# Patient Record
Sex: Female | Born: 1950 | Race: White | Hispanic: No | Smoking: Never smoker
Health system: Southern US, Community
[De-identification: ages and names within clinical notes are randomized; demographics above are authoritative.]

## PROBLEM LIST (undated history)

## (undated) DIAGNOSIS — E039 Hypothyroidism, unspecified: Secondary | ICD-10-CM

## (undated) DIAGNOSIS — A159 Respiratory tuberculosis unspecified: Secondary | ICD-10-CM

## (undated) DIAGNOSIS — I10 Essential (primary) hypertension: Secondary | ICD-10-CM

## (undated) DIAGNOSIS — M1990 Unspecified osteoarthritis, unspecified site: Secondary | ICD-10-CM

## (undated) DIAGNOSIS — I83009 Varicose veins of unspecified lower extremity with ulcer of unspecified site: Secondary | ICD-10-CM

## (undated) DIAGNOSIS — M810 Age-related osteoporosis without current pathological fracture: Secondary | ICD-10-CM

## (undated) DIAGNOSIS — F32A Depression, unspecified: Secondary | ICD-10-CM

## (undated) DIAGNOSIS — I2510 Atherosclerotic heart disease of native coronary artery without angina pectoris: Secondary | ICD-10-CM

## (undated) DIAGNOSIS — S72002A Fracture of unspecified part of neck of left femur, initial encounter for closed fracture: Secondary | ICD-10-CM

## (undated) DIAGNOSIS — J189 Pneumonia, unspecified organism: Secondary | ICD-10-CM

## (undated) DIAGNOSIS — K219 Gastro-esophageal reflux disease without esophagitis: Secondary | ICD-10-CM

## (undated) DIAGNOSIS — L97909 Non-pressure chronic ulcer of unspecified part of unspecified lower leg with unspecified severity: Secondary | ICD-10-CM

## (undated) DIAGNOSIS — E785 Hyperlipidemia, unspecified: Secondary | ICD-10-CM

## (undated) DIAGNOSIS — I509 Heart failure, unspecified: Secondary | ICD-10-CM

## (undated) DIAGNOSIS — F329 Major depressive disorder, single episode, unspecified: Secondary | ICD-10-CM

## (undated) DIAGNOSIS — I219 Acute myocardial infarction, unspecified: Secondary | ICD-10-CM

## (undated) HISTORY — PX: TONSILLECTOMY: SUR1361

## (undated) HISTORY — DX: Fracture of unspecified part of neck of left femur, initial encounter for closed fracture: S72.002A

## (undated) HISTORY — PX: CORONARY ARTERY BYPASS GRAFT: SHX141

## (undated) HISTORY — DX: Age-related osteoporosis without current pathological fracture: M81.0

## (undated) HISTORY — PX: CARDIAC CATHETERIZATION: SHX172

---

## 2001-08-17 ENCOUNTER — Encounter: Payer: Self-pay | Admitting: Family Medicine

## 2001-08-17 ENCOUNTER — Ambulatory Visit (HOSPITAL_COMMUNITY): Admission: RE | Admit: 2001-08-17 | Discharge: 2001-08-17 | Payer: Self-pay | Admitting: Family Medicine

## 2004-06-14 ENCOUNTER — Emergency Department (HOSPITAL_COMMUNITY): Admission: EM | Admit: 2004-06-14 | Discharge: 2004-06-14 | Payer: Self-pay | Admitting: Emergency Medicine

## 2010-09-09 HISTORY — PX: CORONARY ARTERY BYPASS GRAFT: SHX141

## 2011-07-25 ENCOUNTER — Encounter: Payer: Self-pay | Admitting: Emergency Medicine

## 2011-07-25 ENCOUNTER — Inpatient Hospital Stay (HOSPITAL_COMMUNITY)
Admission: EM | Admit: 2011-07-25 | Discharge: 2011-08-12 | DRG: 121 | Disposition: A | Discharge status: Home / Self Care | Payer: BC Managed Care – PPO | Attending: Internal Medicine | Admitting: Internal Medicine

## 2011-07-25 ENCOUNTER — Emergency Department (HOSPITAL_COMMUNITY): Payer: BC Managed Care – PPO

## 2011-07-25 DIAGNOSIS — E1029 Type 1 diabetes mellitus with other diabetic kidney complication: Secondary | ICD-10-CM | POA: Diagnosis present

## 2011-07-25 DIAGNOSIS — N179 Acute kidney failure, unspecified: Secondary | ICD-10-CM | POA: Diagnosis not present

## 2011-07-25 DIAGNOSIS — R9431 Abnormal electrocardiogram [ECG] [EKG]: Secondary | ICD-10-CM

## 2011-07-25 DIAGNOSIS — I5023 Acute on chronic systolic (congestive) heart failure: Secondary | ICD-10-CM

## 2011-07-25 DIAGNOSIS — E860 Dehydration: Secondary | ICD-10-CM | POA: Diagnosis present

## 2011-07-25 DIAGNOSIS — E876 Hypokalemia: Secondary | ICD-10-CM | POA: Diagnosis present

## 2011-07-25 DIAGNOSIS — Z794 Long term (current) use of insulin: Secondary | ICD-10-CM

## 2011-07-25 DIAGNOSIS — I129 Hypertensive chronic kidney disease with stage 1 through stage 4 chronic kidney disease, or unspecified chronic kidney disease: Secondary | ICD-10-CM | POA: Diagnosis present

## 2011-07-25 DIAGNOSIS — F329 Major depressive disorder, single episode, unspecified: Secondary | ICD-10-CM | POA: Diagnosis present

## 2011-07-25 DIAGNOSIS — E1010 Type 1 diabetes mellitus with ketoacidosis without coma: Secondary | ICD-10-CM | POA: Diagnosis present

## 2011-07-25 DIAGNOSIS — D72829 Elevated white blood cell count, unspecified: Secondary | ICD-10-CM | POA: Diagnosis present

## 2011-07-25 DIAGNOSIS — I214 Non-ST elevation (NSTEMI) myocardial infarction: Principal | ICD-10-CM | POA: Diagnosis present

## 2011-07-25 DIAGNOSIS — F3289 Other specified depressive episodes: Secondary | ICD-10-CM | POA: Diagnosis present

## 2011-07-25 DIAGNOSIS — E785 Hyperlipidemia, unspecified: Secondary | ICD-10-CM

## 2011-07-25 DIAGNOSIS — E1110 Type 2 diabetes mellitus with ketoacidosis without coma: Secondary | ICD-10-CM

## 2011-07-25 DIAGNOSIS — E162 Hypoglycemia, unspecified: Secondary | ICD-10-CM | POA: Diagnosis not present

## 2011-07-25 DIAGNOSIS — F339 Major depressive disorder, recurrent, unspecified: Secondary | ICD-10-CM | POA: Diagnosis present

## 2011-07-25 DIAGNOSIS — I83009 Varicose veins of unspecified lower extremity with ulcer of unspecified site: Secondary | ICD-10-CM | POA: Diagnosis present

## 2011-07-25 DIAGNOSIS — L97809 Non-pressure chronic ulcer of other part of unspecified lower leg with unspecified severity: Secondary | ICD-10-CM | POA: Diagnosis present

## 2011-07-25 DIAGNOSIS — I509 Heart failure, unspecified: Secondary | ICD-10-CM | POA: Diagnosis present

## 2011-07-25 DIAGNOSIS — R570 Cardiogenic shock: Secondary | ICD-10-CM | POA: Diagnosis present

## 2011-07-25 DIAGNOSIS — I2510 Atherosclerotic heart disease of native coronary artery without angina pectoris: Secondary | ICD-10-CM

## 2011-07-25 DIAGNOSIS — K8020 Calculus of gallbladder without cholecystitis without obstruction: Secondary | ICD-10-CM | POA: Diagnosis present

## 2011-07-25 DIAGNOSIS — N390 Urinary tract infection, site not specified: Secondary | ICD-10-CM | POA: Diagnosis not present

## 2011-07-25 DIAGNOSIS — E1049 Type 1 diabetes mellitus with other diabetic neurological complication: Secondary | ICD-10-CM | POA: Diagnosis present

## 2011-07-25 DIAGNOSIS — R5381 Other malaise: Secondary | ICD-10-CM | POA: Diagnosis present

## 2011-07-25 DIAGNOSIS — I10 Essential (primary) hypertension: Secondary | ICD-10-CM | POA: Diagnosis present

## 2011-07-25 DIAGNOSIS — L97909 Non-pressure chronic ulcer of unspecified part of unspecified lower leg with unspecified severity: Secondary | ICD-10-CM | POA: Diagnosis present

## 2011-07-25 DIAGNOSIS — R110 Nausea: Secondary | ICD-10-CM | POA: Diagnosis present

## 2011-07-25 DIAGNOSIS — E46 Unspecified protein-calorie malnutrition: Secondary | ICD-10-CM | POA: Diagnosis present

## 2011-07-25 DIAGNOSIS — I872 Venous insufficiency (chronic) (peripheral): Secondary | ICD-10-CM | POA: Diagnosis present

## 2011-07-25 DIAGNOSIS — E1065 Type 1 diabetes mellitus with hyperglycemia: Secondary | ICD-10-CM | POA: Diagnosis present

## 2011-07-25 DIAGNOSIS — I2589 Other forms of chronic ischemic heart disease: Secondary | ICD-10-CM | POA: Diagnosis present

## 2011-07-25 DIAGNOSIS — N189 Chronic kidney disease, unspecified: Secondary | ICD-10-CM | POA: Diagnosis present

## 2011-07-25 HISTORY — DX: Varicose veins of unspecified lower extremity with ulcer of unspecified site: I83.009

## 2011-07-25 HISTORY — DX: Hyperlipidemia, unspecified: E78.5

## 2011-07-25 HISTORY — DX: Essential (primary) hypertension: I10

## 2011-07-25 HISTORY — DX: Respiratory tuberculosis unspecified: A15.9

## 2011-07-25 HISTORY — DX: Non-pressure chronic ulcer of unspecified part of unspecified lower leg with unspecified severity: L97.909

## 2011-07-25 LAB — CBC
HCT: 44.6 % (ref 36.0–46.0)
Hemoglobin: 14.9 g/dL (ref 12.0–15.0)
MCH: 29.2 pg (ref 26.0–34.0)
MCHC: 33.4 g/dL (ref 30.0–36.0)
MCV: 87.3 fL (ref 78.0–100.0)
Platelets: 247 10*3/uL (ref 150–400)
RBC: 5.11 MIL/uL (ref 3.87–5.11)
RDW: 18.5 % — ABNORMAL HIGH (ref 11.5–15.5)
WBC: 15.5 10*3/uL — ABNORMAL HIGH (ref 4.0–10.5)

## 2011-07-25 LAB — BASIC METABOLIC PANEL
BUN: 21 mg/dL (ref 6–23)
BUN: 22 mg/dL (ref 6–23)
CO2: 18 mEq/L — ABNORMAL LOW (ref 19–32)
CO2: 21 mEq/L (ref 19–32)
Calcium: 8.6 mg/dL (ref 8.4–10.5)
Chloride: 100 mEq/L (ref 96–112)
Chloride: 99 mEq/L (ref 96–112)
Creatinine, Ser: 1.34 mg/dL — ABNORMAL HIGH (ref 0.50–1.10)
GFR calc Af Amer: 48 mL/min — ABNORMAL LOW (ref >90)
GFR calc non Af Amer: 42 mL/min — ABNORMAL LOW (ref >90)
GFR calc non Af Amer: 42 mL/min — ABNORMAL LOW (ref >90)
Glucose, Bld: 98 mg/dL (ref 70–99)
Glucose, Bld: 100 mg/dL — ABNORMAL HIGH (ref 70–99)
Potassium: 3.8 mEq/L (ref 3.5–5.1)
Sodium: 134 mEq/L — ABNORMAL LOW (ref 135–145)
Sodium: 135 mEq/L (ref 135–145)

## 2011-07-25 LAB — URINE MICROSCOPIC-ADD ON

## 2011-07-25 LAB — URINALYSIS, ROUTINE W REFLEX MICROSCOPIC
Glucose, UA: >1000 mg/dL — AB
Ketones, ur: 15 mg/dL — AB
pH: 5 (ref 5.0–8.0)

## 2011-07-25 LAB — POCT I-STAT 3, VENOUS BLOOD GAS (G3P V)
Acid-base deficit: 14 mmol/L — ABNORMAL HIGH (ref 0.0–2.0)
Bicarbonate: 13.1 mEq/L — ABNORMAL LOW (ref 20.0–24.0)
O2 Saturation: 39 %
pO2, Ven: 27 mmHg — CL (ref 30.0–45.0)

## 2011-07-25 LAB — POCT I-STAT, CHEM 8
BUN: 24 mg/dL — ABNORMAL HIGH (ref 6–23)
Calcium, Ion: 0.98 mmol/L — ABNORMAL LOW (ref 1.12–1.32)
Chloride: 101 mEq/L (ref 96–112)
Glucose, Bld: 610 mg/dL (ref 70–99)

## 2011-07-25 LAB — DIFFERENTIAL
Basophils Absolute: 0 10*3/uL (ref 0.0–0.1)
Eosinophils Relative: 0 % (ref 0–5)
Lymphocytes Relative: 10 % — ABNORMAL LOW (ref 12–46)
Lymphs Abs: 1.5 10*3/uL (ref 0.7–4.0)
Monocytes Absolute: 1 10*3/uL (ref 0.1–1.0)

## 2011-07-25 LAB — GLUCOSE, CAPILLARY
Glucose-Capillary: 410 mg/dL — ABNORMAL HIGH (ref 70–99)
Glucose-Capillary: 357 mg/dL — ABNORMAL HIGH (ref 70–99)
Glucose-Capillary: 77 mg/dL (ref 70–99)
Glucose-Capillary: 83 mg/dL (ref 70–99)
Glucose-Capillary: 145 mg/dL — ABNORMAL HIGH (ref 70–99)

## 2011-07-25 MED ORDER — HYDROMORPHONE HCL PF 1 MG/ML IJ SOLN
0.5000 mg | Freq: Once | INTRAMUSCULAR | Status: DC
  Filled 2011-07-25: qty 1

## 2011-07-25 MED ORDER — DEXTROSE 50 % IV SOLN
25.0000 mL | INTRAVENOUS | Status: DC | PRN
  Administered 2011-07-29 (×3): 25 mL via INTRAVENOUS
  Filled 2011-07-25: qty 50

## 2011-07-25 MED ORDER — INSULIN ASPART 100 UNIT/ML ~~LOC~~ SOLN
0.0000 [IU] | Freq: Three times a day (TID) | SUBCUTANEOUS | Status: DC
  Administered 2011-07-26: 9 [IU] via SUBCUTANEOUS
  Administered 2011-07-26: 2 [IU] via SUBCUTANEOUS
  Administered 2011-07-27: 1 [IU] via SUBCUTANEOUS
  Administered 2011-07-27 (×2): 3 [IU] via SUBCUTANEOUS
  Administered 2011-07-28: 1 [IU] via SUBCUTANEOUS
  Administered 2011-07-29: 5 [IU] via SUBCUTANEOUS
  Administered 2011-07-30: 2 [IU] via SUBCUTANEOUS
  Administered 2011-07-30: 1 [IU] via SUBCUTANEOUS
  Administered 2011-07-30 – 2011-07-31 (×2): 2 [IU] via SUBCUTANEOUS
  Administered 2011-07-31: 1 [IU] via SUBCUTANEOUS
  Administered 2011-08-01: 5 [IU] via SUBCUTANEOUS
  Administered 2011-08-02: 2 [IU] via SUBCUTANEOUS
  Administered 2011-08-02 (×2): 3 [IU] via SUBCUTANEOUS
  Administered 2011-08-03 (×2): 1 [IU] via SUBCUTANEOUS
  Administered 2011-08-03: 5 [IU] via SUBCUTANEOUS
  Administered 2011-08-04: 3 [IU] via SUBCUTANEOUS
  Administered 2011-08-04: 2 [IU] via SUBCUTANEOUS
  Administered 2011-08-04 – 2011-08-05 (×2): 5 [IU] via SUBCUTANEOUS
  Administered 2011-08-05: 1 [IU] via SUBCUTANEOUS
  Administered 2011-08-05 – 2011-08-06 (×3): 2 [IU] via SUBCUTANEOUS
  Administered 2011-08-06: 7 [IU] via SUBCUTANEOUS
  Administered 2011-08-07 (×2): 9 [IU] via SUBCUTANEOUS
  Administered 2011-08-08: 2 [IU] via SUBCUTANEOUS
  Administered 2011-08-08: 9 [IU] via SUBCUTANEOUS
  Administered 2011-08-09: 7 [IU] via SUBCUTANEOUS
  Administered 2011-08-09: 2 [IU] via SUBCUTANEOUS
  Administered 2011-08-09: 7 [IU] via SUBCUTANEOUS
  Filled 2011-07-25 (×23): qty 3

## 2011-07-25 MED ORDER — DEXTROSE-NACL 5-0.45 % IV SOLN
INTRAVENOUS | Status: DC
  Administered 2011-07-25: 125 mL via INTRAVENOUS

## 2011-07-25 MED ORDER — HYDROMORPHONE HCL PF 1 MG/ML IJ SOLN
INTRAMUSCULAR | Status: AC
  Administered 2011-07-25: 1 mg
  Filled 2011-07-25: qty 1

## 2011-07-25 MED ORDER — SODIUM CHLORIDE 0.9 % IV SOLN
INTRAVENOUS | Status: DC
  Filled 2011-07-25: qty 1

## 2011-07-25 MED ORDER — INSULIN ASPART 100 UNIT/ML ~~LOC~~ SOLN
0.0000 [IU] | Freq: Every day | SUBCUTANEOUS | Status: DC
  Administered 2011-07-30: 0 [IU] via SUBCUTANEOUS
  Administered 2011-07-31: 2 [IU] via SUBCUTANEOUS
  Filled 2011-07-25 (×3): qty 3

## 2011-07-25 MED ORDER — POTASSIUM CHLORIDE 10 MEQ/100ML IV SOLN: 10.0000 meq | INTRAVENOUS | Status: DC

## 2011-07-25 MED ORDER — HEPARIN SODIUM (PORCINE) 5000 UNIT/ML IJ SOLN
5000.0000 [IU] | Freq: Three times a day (TID) | INTRAMUSCULAR | Status: DC
  Administered 2011-07-26: 5000 [IU] via SUBCUTANEOUS
  Filled 2011-07-25 (×2): qty 1

## 2011-07-25 MED ORDER — PANTOPRAZOLE SODIUM 40 MG IV SOLR
40.0000 mg | Freq: Once | INTRAVENOUS | Status: AC
  Administered 2011-07-26: 40 mg via INTRAVENOUS
  Filled 2011-07-25: qty 40

## 2011-07-25 MED ORDER — ONDANSETRON HCL 4 MG/2ML IJ SOLN: 4.0000 mg | INTRAMUSCULAR | Status: DC | PRN

## 2011-07-25 MED ORDER — ONDANSETRON HCL 4 MG/2ML IJ SOLN
4.0000 mg | Freq: Four times a day (QID) | INTRAMUSCULAR | Status: DC
  Administered 2011-07-25 – 2011-07-30 (×13): 4 mg via INTRAVENOUS
  Filled 2011-07-25 (×32): qty 2

## 2011-07-25 MED ORDER — INSULIN GLARGINE 100 UNIT/ML ~~LOC~~ SOLN
15.0000 [IU] | Freq: Every day | SUBCUTANEOUS | Status: DC
  Administered 2011-07-26: 15 [IU] via SUBCUTANEOUS
  Filled 2011-07-25: qty 3

## 2011-07-25 MED ORDER — SODIUM CHLORIDE 0.9 % IV SOLN: 0.5000 mg/h | Freq: Once | INTRAVENOUS | Status: DC

## 2011-07-25 MED ORDER — ONDANSETRON HCL 4 MG/2ML IJ SOLN
4.0000 mg | Freq: Once | INTRAMUSCULAR | Status: AC
  Administered 2011-07-25 (×2): 4 mg via INTRAVENOUS
  Filled 2011-07-25: qty 2

## 2011-07-25 MED ORDER — SIMVASTATIN 20 MG PO TABS
20.0000 mg | ORAL_TABLET | Freq: Every day | ORAL | Status: DC
  Filled 2011-07-25: qty 1

## 2011-07-25 MED ORDER — LEVOTHYROXINE SODIUM 100 MCG PO TABS
100.0000 ug | ORAL_TABLET | Freq: Every day | ORAL | Status: DC
  Administered 2011-07-27 – 2011-08-12 (×17): 100 ug via ORAL
  Filled 2011-07-25 (×18): qty 1

## 2011-07-25 MED ORDER — SODIUM CHLORIDE 0.45 % IV SOLN: INTRAVENOUS | Status: DC

## 2011-07-25 MED ORDER — SODIUM CHLORIDE 0.9 % IV SOLN
INTRAVENOUS | Status: DC
  Administered 2011-07-25: 17:00:00 via INTRAVENOUS

## 2011-07-25 MED ORDER — SODIUM CHLORIDE 0.9 % IV SOLN: INTRAVENOUS | Status: DC

## 2011-07-25 MED ORDER — INSULIN GLARGINE 100 UNIT/ML ~~LOC~~ SOLN
20.0000 [IU] | Freq: Every day | SUBCUTANEOUS | Status: DC
  Administered 2011-07-27 – 2011-07-28 (×2): 20 [IU] via SUBCUTANEOUS
  Filled 2011-07-25: qty 3

## 2011-07-25 MED ORDER — PANTOPRAZOLE SODIUM 40 MG IV SOLR
40.0000 mg | Freq: Every day | INTRAVENOUS | Status: DC
  Administered 2011-07-27: 40 mg via INTRAVENOUS
  Filled 2011-07-25 (×3): qty 40

## 2011-07-25 MED ORDER — ESCITALOPRAM OXALATE 10 MG PO TABS
10.0000 mg | ORAL_TABLET | Freq: Every day | ORAL | Status: DC
  Administered 2011-07-27 – 2011-07-29 (×3): 10 mg via ORAL
  Filled 2011-07-25 (×6): qty 1

## 2011-07-25 MED ORDER — SODIUM CHLORIDE 0.9 % IV BOLUS (SEPSIS)
2000.0000 mL | Freq: Once | INTRAVENOUS | Status: AC
  Administered 2011-07-25: 2000 mL via INTRAVENOUS

## 2011-07-25 MED ORDER — ONDANSETRON HCL 4 MG/2ML IJ SOLN
INTRAMUSCULAR | Status: AC
  Administered 2011-07-25: 4 mg via INTRAVENOUS
  Filled 2011-07-25: qty 2

## 2011-07-25 MED ORDER — SODIUM CHLORIDE 0.9 % IV SOLN
INTRAVENOUS | Status: AC
  Administered 2011-07-25: 4.5 [IU]/h via INTRAVENOUS
  Filled 2011-07-25: qty 1

## 2011-07-25 NOTE — H&P (Signed)
PCP:   Dr. Alen Blew in [place] per patient   Chief Complaint:  Nausea, high blood sugars   HPI:  60yoF with diabetes on insulin, HTN, HL, but no prior known cardiac history presents with several months of nausea due to possible biliary colic, and resultant poor PO intake, now with DKA.   History provided by patient, but elaborated on by family as she apparently is minimizing the extent of her poor health. Apparently for past few months has had a lot of nausea, and RUQ pain worse with eating, that resolves on its own. States she also has a bad hiatal hernia. States she's had RUQ w/u but unknown the extent of it. The nausea is such that her PO intake is chronically poor, but she'll take insulin and her son (who is an EMT) states he's come over there very frequently to find her comatose, altered, with very low blood sugars.  However, for the past couple days, she's been more nauseous, vomiting, unable to hold down any PO at all, and didn't take any insulin at all yesterday, but did the day before. Sugars have been running 300's for the past couple days.   In the ED: WBC 15.5, Hct 44.6, Na 131, renal 24 / 1.6 (no prior baseline), glucose 610. Vitals were with initially with some hypoTN but this resolved with IVF's and now appears stable. VBG showed 7.189 / CO2 34 / O2 venous 27 / HCO3 14. Started on IVF, now s/p 2L and glucose stabilizer.   When asked about her apparent Q waves on EKG, she says she's never had chest pain, pressure, AMI, cath, etc. but husband does state she has been getting dyspneic after only 20 feet of walking, has to rest then can walk 20 feet more, then rest, etc., but no angina or chest pain. When asked about her gross fresh venous stasis ulcers and pitting edema, these have apparently been hidden from her son and husband. She denies orthopnea or PND.   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 She has been minimizing her medical issues, not caring for herself. This is after she started to cry a lot while I interviewed her. She said in the room that her husband yells at her and calls her stupid, however outside the room, the husband says this is simply not true, there is no abuse and he vigorously defends himself. The son appears to corroborate this.   ROS as above, otherwise with subjective but no objective fevers, and noctiuria. No diarrhea, dysuria, cough, other GI pain. O/w negative.    Past Medical History  Diagnosis Date  . Diabetes mellitus     on insulin, with h/o DKA   . HTN (hypertension)   . Hyperlipidemia   . Venous stasis ulcers   . Q waves suggestive of previous myocardial infarction     Pt denies h/o AMI, cath, PCI    History reviewed. No pertinent past surgical history.  Medications:  HOME MEDS: Prior to Admission medications   Medication Sig Start Date End Date Taking? Authorizing Provider  atorvastatin (LIPITOR) 20 MG tablet Take by mouth daily. Pt doesn't know dose    Yes Historical Provider,  MD  escitalopram (LEXAPRO) 10 MG tablet Take 10 mg by mouth daily.     Yes Historical Provider, MD  insulin detemir (LEVEMIR) 100 UNIT/ML injection Inject 25 Units into the skin every morning.     Yes Historical Provider, MD  insulin lispro (HUMALOG) 100 UNIT/ML injection Inject 5 Units into the skin 3 (three) times daily before meals.     Yes Historical Provider, MD  levothyroxine (SYNTHROID, LEVOTHROID) 100 MCG tablet Take 100 mcg by mouth daily.     Yes Historical Provider, MD  metFORMIN (GLUCOPHAGE) 1000 MG tablet Take 1,000 mg by mouth 2 (two) times daily with a meal.     Yes Historical Provider, MD  PRESCRIPTION MEDICATION Blood pressure medication     Yes  Historical Provider, MD  PRESCRIPTION MEDICATION Cholesterol medication    Yes Historical Provider, MD  ramipril (ALTACE) 10 MG capsule Take by mouth daily. Pt doesn't know dose   Yes Historical Provider, MD  insulin detemir (LEVEMIR) 100 UNIT/ML injection Inject 20 Units into the skin at bedtime as needed. For high blood sugar     Historical Provider, MD    Allergies:  Allergies  Allergen Reactions  . Sulfa Antibiotics Anaphylaxis and Swelling    Social History:   reports that she has never smoked. She does not have any smokeless tobacco history on file. She reports that she does not drink alcohol or use illicit drugs.  Family History: Family History  Problem Relation Age of Onset  . Heart disease Father   . Hypertension Mother   . Multiple sclerosis Father     Physical Exam: Filed Vitals:   07/25/11 0300 07/25/11 0315 07/25/11 0330 07/25/11 0345  BP: 121/63 110/73 116/73 92/69  Pulse: 91 89 87 68  Temp:      TempSrc:      Resp: 17 15 17 16   SpO2: 100% 100% 100% 98%   Blood pressure 92/69, pulse 68, temperature 97.4 F (36.3 C), temperature source Oral, resp. rate 16, SpO2 98.00%.  Gen: Middle aged F in ED stretcher with family at bedside, appears comfortable, no distress. Begins to cry at end of interview. Able to relate her history well. Family gets angry with her while interviewing her, ordering her to be truthful about her medical problems  HEENT: PERRL bilaterally, 5-6 mm, EOMI, no icterus, tongue appears moist, but lips are very dried and crusted appearing Neck: supple Lungs: CTAB no wheezes, crackles Heart: RRR, soft S1/2, no murmurs or gallops or heaves Abd: obese, but soft, non-peritoneal, non-distended, some minimal TTP in RUQ but no Murphy's sign Extrem: warm, perfusing well, right radial more palpable than left, BLE's have gross pitting edema up to above the knee, with hyperpigmented venous stasis changes, and bilateral anterior venous stasis ulcers that appear  very fresh, with rim of erythema and weeping Neuro: Alert, conversant, moving extremities, sits up in bed without assistance, nothing focal     Labs & Imaging Results for orders placed during the hospital encounter of 07/25/11 (from the past 48 hour(s))  CBC     Status: Abnormal   Collection Time   07/25/11  1:52 AM      Component Value Range Comment   WBC 15.5 (*) 4.0 - 10.5 (K/uL)    RBC 5.11  3.87 - 5.11 (MIL/uL)    Hemoglobin 14.9  12.0 - 15.0 (g/dL)    HCT 44.6  36.0 - 46.0 (%)    MCV 87.3  78.0 - 100.0 (fL)    MCH  29.2  26.0 - 34.0 (pg)    MCHC 33.4  30.0 - 36.0 (g/dL)    RDW 18.5 (*) 11.5 - 15.5 (%)    Platelets 247  150 - 400 (K/uL)   DIFFERENTIAL     Status: Abnormal   Collection Time   07/25/11  1:52 AM      Component Value Range Comment   Neutrophils Relative 84 (*) 43 - 77 (%)    Neutro Abs 12.9 (*) 1.7 - 7.7 (K/uL)    Lymphocytes Relative 10 (*) 12 - 46 (%)    Lymphs Abs 1.5  0.7 - 4.0 (K/uL)    Monocytes Relative 7  3 - 12 (%)    Monocytes Absolute 1.0  0.1 - 1.0 (K/uL)    Eosinophils Relative 0  0 - 5 (%)    Eosinophils Absolute 0.0  0.0 - 0.7 (K/uL)    Basophils Relative 0  0 - 1 (%)    Basophils Absolute 0.0  0.0 - 0.1 (K/uL)   POCT I-STAT, CHEM 8     Status: Abnormal   Collection Time   07/25/11  2:19 AM      Component Value Range Comment   Sodium 131 (*) 135 - 145 (mEq/L)    Potassium 4.8  3.5 - 5.1 (mEq/L)    Chloride 101  96 - 112 (mEq/L)    BUN 24 (*) 6 - 23 (mg/dL)    Creatinine, Ser 1.60 (*) 0.50 - 1.10 (mg/dL)    Glucose, Bld 610 (*) 70 - 99 (mg/dL)    Calcium, Ion 0.98 (*) 1.12 - 1.32 (mmol/L)    TCO2 11  0 - 100 (mmol/L)    Hemoglobin 17.7 (*) 12.0 - 15.0 (g/dL)    HCT 52.0 (*) 36.0 - 46.0 (%)    Comment NOTIFIED PHYSICIAN     POCT I-STAT 3, BLOOD GAS (G3P V)     Status: Abnormal   Collection Time   07/25/11  3:03 AM      Component Value Range Comment   pH, Ven 7.189 (*) 7.250 - 7.300     pCO2, Ven 34.4 (*) 45.0 - 50.0 (mmHg)    pO2,  Ven 27.0 (*) 30.0 - 45.0 (mmHg)    Bicarbonate 13.1 (*) 20.0 - 24.0 (mEq/L)    TCO2 14  0 - 100 (mmol/L)    O2 Saturation 39.0      Acid-base deficit 14.0 (*) 0.0 - 2.0 (mmol/L)    Sample type VENOUS      Comment NOTIFIED PHYSICIAN      No results found.  EKG: NSR 94 bpm, normal axis, BAE in V1, P-mitrale in I, PR segment normal, Q waves inferior leads, QRS 84 msec, possible Q waves laterally, ST depressions I and aVL, diffuse TWF in inferolateral leads. No prior for comparison   Impression Present on Admission:  .DKA (diabetic ketoacidoses) .HTN (hypertension) .Q waves suggestive of previous myocardial infarction .Venous stasis ulcers  60yoF with diabetes on insulin, HTN, HL, but no prior known cardiac history presents with several months of nausea due to possible biliary colic, and resultant poor PO intake, now with DKA, EKG changes, venous stasis ulcers and pitting edema, Cr 1.6 with unclear baseline, and depression.   1. DKA: Has h/o DKA years ago as well. Likely due to poor PO intake and not taking insulin, but WBC 15 also suggests infection, and EKG changes and possible exam with CHF suggests ? Ischemia. Son states he was surprised to see her hyperglycemic  bc she has such frequent problems with hypoglycemia and resultant altered mental status.   - Glucose stabilizer for now  - IVF's, now s/p 2L NS - Trend BMET q4, will give some K - Hold Metformin  - DM education consultation   2. Inferior Q waves with lateral ST depressions, BLE venous stasis ulcers: Pt states no cardiac history, never had chest pain, no caths etc. but has had dyspnea on minimal exertion for awhile now. All suggest she may have CHF but no prior echo.   - Echo, ROMI, trend EKG - Wound consult - Hold on diuresis for now given fluid resuscitation but once acute phase therapy for DKA resolved, will likely need diuresis and HF therapy  - Consider cardiology outpt f/u depending on w/u  - Continue statin, holding  Ramipril   3. Acute vs chronic renal insufficiency: Cr is 1.6 with unclear baseline. Could be acute due to dehydration.   - IVF's as above, trend BMET - Touch base with PCP re: prior Cr value  - Holding Ramipril   4. Social: Probably very depressed and this is affecting her ability to care for self  - SW and Psych consultation - Continue Lexapro  - Get TSH, continue current thryoid dose   5. Leukocytosis: Only infectious ROS is chronic RUQ pain/nausea that could be a gallbladder process, however this sounds more like chronic biliary cholic.   - CXR, UA, LFT's, hold on ABx for now though   6. Chronic nausea: with biliary cholic. DDx includes hiatal hernia.   - LFT's and RUQ u/s   7. Hyperlipidemia: continue formulary alternative statin  SDU, team 12  Presumed full, not able to be discussed   Other plans as per orders.  Critical care time: 60 minutes.  Cailean Heacock 07/25/2011, 4:49 AM

## 2011-07-25 NOTE — ED Notes (Signed)
Family at bedside. 

## 2011-07-25 NOTE — ED Notes (Signed)
Patient states she is feeling much better, family at bedside. Consulting md at bedside.

## 2011-07-25 NOTE — ED Provider Notes (Signed)
History     CSN: [id number] Arrival date & time: 07/25/2011  1:33 AM   First MD Initiated Contact with Patient 07/25/11 0142      Chief Complaint  Patient presents with  . Nausea and vomiting  .         (Consider location/radiation/quality/duration/timing/severity/associated sxs/prior treatment) HPI This is a 60 year old white female with history of diabetes. She has a two-day history of nausea and vomiting which is persistent. She has not been able to keep anything on her stomach. As a result she has not been taking her insulin. Her family became concerned because her sugar read over 600 and call EMS. EMS also had difficulty obtaining a blood pressure. She states the vomiting is moderate to severe without any relieving factor. She denies abdominal pain or diarrhea. She complains of dry mouth and frequent urination. She denies chest pain but does have some shortness of breath. She also states she has sores on her lower legs that are new.  Past Medical History  Diagnosis Date  . Diabetes mellitus     History reviewed. No pertinent past surgical history.  No family history on file.  History  Substance Use Topics  . Smoking status: Not on file  . Smokeless tobacco: Not on file  . Alcohol Use:     OB History    Grav Para Term Preterm Abortions TAB SAB Ect Mult Living                  Review of Systems  All other systems reviewed and are negative.    Allergies  Review of patient's allergies indicates no known allergies.  Home Medications  No current outpatient prescriptions on file.  There were no vitals taken for this visit.  Physical Exam General: Well-developed, well-nourished female in no acute distress; appears older than age of record HENT: normocephalic, atraumatic Eyes: pupils equal round and reactive to light; extraocular muscles intact Neck: supple Heart: regular rate and rhythm Lungs: clear to auscultation bilaterally Abdomen: soft; nontender;  nondistended; no masses or hepatosplenomegaly; bowel sounds present Extremities: No deformity; full range of motion; pulses weak Neurologic: Awake, alert; motor function intact in all extremities and symmetric; no facial droop Skin: Warm and dry; skin of lower legs indurated with stasis ulcerations anteriorly bilaterally Psychiatric: Normal mood and affect    ED Course  Procedures (including critical care time)   MDM   Nursing notes and vitals signs, including pulse oximetry, reviewed.  Summary of this visit's results, reviewed by myself:  Labs:  Results for orders placed during the hospital encounter of 07/25/11  CBC      Component Value Range   WBC 15.5 (*) 4.0 - 10.5 (K/uL)   RBC 5.11  3.87 - 5.11 (MIL/uL)   Hemoglobin 14.9  12.0 - 15.0 (g/dL)   HCT 44.6  36.0 - 46.0 (%)   MCV 87.3  78.0 - 100.0 (fL)   MCH 29.2  26.0 - 34.0 (pg)   MCHC 33.4  30.0 - 36.0 (g/dL)   RDW 18.5 (*) 11.5 - 15.5 (%)   Platelets 247  150 - 400 (K/uL)  DIFFERENTIAL      Component Value Range   Neutrophils Relative 84 (*) 43 - 77 (%)   Neutro Abs 12.9 (*) 1.7 - 7.7 (K/uL)   Lymphocytes Relative 10 (*) 12 - 46 (%)   Lymphs Abs 1.5  0.7 - 4.0 (K/uL)   Monocytes Relative 7  3 - 12 (%)   Monocytes Absolute  1.0  0.1 - 1.0 (K/uL)   Eosinophils Relative 0  0 - 5 (%)   Eosinophils Absolute 0.0  0.0 - 0.7 (K/uL)   Basophils Relative 0  0 - 1 (%)   Basophils Absolute 0.0  0.0 - 0.1 (K/uL)  POCT I-STAT, CHEM 8      Component Value Range   Sodium 131 (*) 135 - 145 (mEq/L)   Potassium 4.8  3.5 - 5.1 (mEq/L)   Chloride 101  96 - 112 (mEq/L)   BUN 24 (*) 6 - 23 (mg/dL)   Creatinine, Ser 1.60 (*) 0.50 - 1.10 (mg/dL)   Glucose, Bld 610 (*) 70 - 99 (mg/dL)   Calcium, Ion 0.98 (*) 1.12 - 1.32 (mmol/L)   TCO2 11  0 - 100 (mmol/L)   Hemoglobin 17.7 (*) 12.0 - 15.0 (g/dL)   HCT 52.0 (*) 36.0 - 46.0 (%)   Comment NOTIFIED PHYSICIAN    POCT I-STAT 3, BLOOD GAS (G3P V)      Component Value Range   pH, Ven  7.189 (*) 7.250 - 7.300    pCO2, Ven 34.4 (*) 45.0 - 50.0 (mmHg)   pO2, Ven 27.0 (*) 30.0 - 45.0 (mmHg)   Bicarbonate 13.1 (*) 20.0 - 24.0 (mEq/L)   TCO2 14  0 - 100 (mmol/L)   O2 Saturation 39.0     Acid-base deficit 14.0 (*) 0.0 - 2.0 (mmol/L)   Sample type VENOUS     Comment NOTIFIED PHYSICIAN     3:45 AM IVF bolus and insulin infusing. Dr. Quay Burow of [facility] to admit to step down.          Wynetta Fines, MD 07/25/11 [phone]

## 2011-07-25 NOTE — ED Notes (Signed)
Family at bedside, wound care nurse called and will see.

## 2011-07-25 NOTE — ED Notes (Signed)
[facility] paged patient c/o nausea.

## 2011-07-25 NOTE — ED Notes (Signed)
Zofran given per order.  Offered pt dinner tray which she denies at this time.  Awaiting effect of Zofran, will inquire later.

## 2011-07-25 NOTE — ED Notes (Signed)
IVF changed to NS per TORB with admitting physician.

## 2011-07-25 NOTE — ED Notes (Signed)
Wound care nurse at bedside.

## 2011-07-25 NOTE — ED Notes (Signed)
Pt c/o bilateral upper abdominal pain at 8/10  that is worse with palpation.  Abdomen is soft, tender with no distention.  Bowel sounds are hypoactive but audible.  She reports that he rnausea is "somewhat better but still there."  Pt has no orders for pain medication for which I asked if she needed and her response was that she would like to have something.  Physician paged for order.

## 2011-07-25 NOTE — Consult Note (Signed)
WOC consult Note  Reason for Consult: requested to eval pt for wound care for bilateral LE ulcerations. Pt reports present x 2 wks or more.  Reports some edema at times but not significant on my assessment today.  Reports that she does not recall edema prior to eruption of wounds. Does not recall any previous ulcerations.  Skin of LE does not appear to have any scarring to indicate previous ulcerations.  Not able to recall any trauma to this area.  Palpable pulses bil.  Wound type: Possible venous stasis ulcerations however without vascular studies to confirm venous stasis can not say for sure. Would recommend follow up with ABI's once admitted to give [place] definitive dx. For long term management options.  Measurement: R pretibial area 3cm x 2cm x 0.5cm  L lateral tibial area 4cm x 3cm x 0.2 cm with smaller area distally 1cm x 1cm.   Wound bed:  All wound beds with yellow base but moist  Drainage (amount, consistency, odor) minimal serous drainage from LLE, no drainage from RLE.   Periwound: erythematosus areas  extends aprox 4-5 cm on both L and R pretibial ulcers concerning for localized infection, may benefit from broad spectrum antibiotics to tx.   Dressing procedure/placement/frequency: All wounds cleansed aggressively with normal saline to clean away loose skin and cleanse wound beds.  Silicone foam dressings applied to bil. LE wounds, will provide moist wound healing, protect from further injury and allow for gentle removal to prevent damage to surrounding skin since it appears very friable.   Discussed POC with ER RN and with family and pt at bedside.   Will follow up on patient once admitted to floor to check progress of wounds. Thanks  Jeanne Terrance Kellogg, [place] [phone])

## 2011-07-25 NOTE — ED Notes (Signed)
Pt c/o mild nausea.  Offered pt sprite and ginger ale which she refused.  Explained to pt that her Zofran was not due until 1800hrs.  She expressed verbal understanding.  Overhead lights turned off and pt given blanket. Call light within reach

## 2011-07-25 NOTE — ED Notes (Signed)
Spoke with hospitalist who in turn wrote an order for Dilaudid 0.5mg .

## 2011-07-25 NOTE — ED Notes (Signed)
CBG 77mg/dL  

## 2011-07-25 NOTE — Progress Notes (Signed)
Patient evaluated in ER. Transitioned off glucose stablizer. Will be resumed long acting insulin (lantus rather than levimir) but at slightly lower doses. Placed on sensitive sliding scale as she is still not eating.   Her GI issues have been present for months now. She often vomits after breakfast but is able to keep down lunch and dinner. She has RUQ pain. Ultrasound reveals normal gall bladdar. She also reports GERD and states she has a hiatal hernia. She is not on a PPI.  She has developed venous stasis ulcer over past 2 weeks. Wound care has seen her today.   Plan  - will get GES as she may have gastroparesis. Start Zofran for now- can start Reglan if GES is positive.  -Start PPI - trying full liquids - cont IVF - change status from stepdown to med/surg

## 2011-07-25 NOTE — ED Notes (Addendum)
Assisted pt in repositioning up in bed for comfort.  NAD, pt has no verbal complaints at this time.

## 2011-07-25 NOTE — ED Notes (Signed)
Pt having high blood sugar at home. Pt c/o nausea. Per ems pt was hypotensive. Pt normotensive here in ED. Pt moving all extremities no distress noted.

## 2011-07-26 ENCOUNTER — Other Ambulatory Visit: Payer: Self-pay

## 2011-07-26 ENCOUNTER — Encounter (HOSPITAL_COMMUNITY)
Admission: EM | Disposition: A | Discharge status: Home / Self Care | Payer: Self-pay | Source: Home / Self Care | Attending: Internal Medicine

## 2011-07-26 ENCOUNTER — Inpatient Hospital Stay (HOSPITAL_COMMUNITY): Payer: BC Managed Care – PPO

## 2011-07-26 ENCOUNTER — Encounter (HOSPITAL_COMMUNITY): Payer: Self-pay | Admitting: *Deleted

## 2011-07-26 DIAGNOSIS — I2510 Atherosclerotic heart disease of native coronary artery without angina pectoris: Secondary | ICD-10-CM

## 2011-07-26 DIAGNOSIS — I214 Non-ST elevation (NSTEMI) myocardial infarction: Secondary | ICD-10-CM

## 2011-07-26 DIAGNOSIS — I369 Nonrheumatic tricuspid valve disorder, unspecified: Secondary | ICD-10-CM

## 2011-07-26 HISTORY — PX: LEFT HEART CATHETERIZATION WITH CORONARY ANGIOGRAM: SHX5451

## 2011-07-26 LAB — CARDIAC PANEL(CRET KIN+CKTOT+MB+TROPI)
CK, MB: 30.9 ng/mL (ref 0.3–4.0)
Relative Index: 8.1 — ABNORMAL HIGH (ref 0.0–2.5)
Total CK: 383 U/L — ABNORMAL HIGH (ref 7–177)
Total CK: 311 U/L — ABNORMAL HIGH (ref 7–177)

## 2011-07-26 LAB — LIPID PANEL
Cholesterol: 131 mg/dL (ref 0–200)
HDL: 58 mg/dL (ref >39)
Total CHOL/HDL Ratio: 2.3 RATIO

## 2011-07-26 LAB — GLUCOSE, CAPILLARY
Glucose-Capillary: 366 mg/dL — ABNORMAL HIGH (ref 70–99)
Glucose-Capillary: 399 mg/dL — ABNORMAL HIGH (ref 70–99)
Glucose-Capillary: 173 mg/dL — ABNORMAL HIGH (ref 70–99)

## 2011-07-26 LAB — CBC
HCT: 42.9 % (ref 36.0–46.0)
Hemoglobin: 14.6 g/dL (ref 12.0–15.0)
Hemoglobin: 15 g/dL (ref 12.0–15.0)
MCH: 29.3 pg (ref 26.0–34.0)
MCHC: 35 g/dL (ref 30.0–36.0)
MCHC: 35.3 g/dL (ref 30.0–36.0)
MCV: 83.1 fL (ref 78.0–100.0)
MCV: 84.1 fL (ref 78.0–100.0)
RDW: 18.8 % — ABNORMAL HIGH (ref 11.5–15.5)

## 2011-07-26 LAB — HEPATIC FUNCTION PANEL
ALT: 30 U/L (ref 0–35)
AST: 93 U/L — ABNORMAL HIGH (ref 0–37)
Albumin: 2.6 g/dL — ABNORMAL LOW (ref 3.5–5.2)
Bilirubin, Direct: 0.4 mg/dL — ABNORMAL HIGH (ref 0.0–0.3)

## 2011-07-26 LAB — PROTIME-INR
INR: 1.41 (ref 0.00–1.49)
Prothrombin Time: 17.5 seconds — ABNORMAL HIGH (ref 11.6–15.2)

## 2011-07-26 LAB — TSH: TSH: 5.723 u[IU]/mL — ABNORMAL HIGH (ref 0.350–4.500)

## 2011-07-26 SURGERY — LEFT HEART CATHETERIZATION WITH CORONARY ANGIOGRAM
Anesthesia: LOCAL

## 2011-07-26 MED ORDER — FENTANYL CITRATE 0.05 MG/ML IJ SOLN
INTRAMUSCULAR | Status: AC
  Filled 2011-07-26: qty 2

## 2011-07-26 MED ORDER — VERAPAMIL HCL 2.5 MG/ML IV SOLN
INTRAVENOUS | Status: AC
  Filled 2011-07-26: qty 2

## 2011-07-26 MED ORDER — LIVING WELL WITH DIABETES BOOK
Freq: Once | Status: AC
  Administered 2011-07-26: 18:00:00
  Filled 2011-07-26: qty 1

## 2011-07-26 MED ORDER — LIDOCAINE HCL (PF) 1 % IJ SOLN
INTRAMUSCULAR | Status: AC
  Filled 2011-07-26: qty 30

## 2011-07-26 MED ORDER — HEPARIN (PORCINE) IN NACL 100-0.45 UNIT/ML-% IJ SOLN: 10.0000 [IU]/kg/h | INTRAMUSCULAR | Status: DC

## 2011-07-26 MED ORDER — INSULIN GLARGINE 100 UNIT/ML ~~LOC~~ SOLN: 15.0000 [IU] | Freq: Every day | SUBCUTANEOUS | Status: DC

## 2011-07-26 MED ORDER — ASPIRIN EC 325 MG PO TBEC: 325.0000 mg | DELAYED_RELEASE_TABLET | Freq: Every day | ORAL | Status: DC

## 2011-07-26 MED ORDER — FUROSEMIDE 10 MG/ML IJ SOLN
40.0000 mg | Freq: Two times a day (BID) | INTRAMUSCULAR | Status: DC
  Administered 2011-07-27: 40 mg via INTRAVENOUS
  Filled 2011-07-26 (×3): qty 4

## 2011-07-26 MED ORDER — HEPARIN BOLUS VIA INFUSION
4000.0000 [IU] | Freq: Once | INTRAVENOUS | Status: AC
  Administered 2011-07-26: 4000 [IU] via INTRAVENOUS
  Filled 2011-07-26: qty 4000

## 2011-07-26 MED ORDER — ACETAMINOPHEN 325 MG PO TABS
650.0000 mg | ORAL_TABLET | ORAL | Status: DC | PRN
  Administered 2011-07-29 – 2011-08-03 (×2): 650 mg via ORAL
  Filled 2011-07-26 (×3): qty 2

## 2011-07-26 MED ORDER — ROSUVASTATIN CALCIUM 20 MG PO TABS
20.0000 mg | ORAL_TABLET | Freq: Every day | ORAL | Status: DC
  Filled 2011-07-26: qty 1

## 2011-07-26 MED ORDER — SODIUM CHLORIDE 0.9 % IV SOLN: 250.0000 mL | INTRAVENOUS | Status: DC

## 2011-07-26 MED ORDER — HEPARIN SODIUM (PORCINE) 1000 UNIT/ML IJ SOLN
INTRAMUSCULAR | Status: AC
  Filled 2011-07-26: qty 1

## 2011-07-26 MED ORDER — ASPIRIN 81 MG PO CHEW: 324.0000 mg | CHEWABLE_TABLET | ORAL | Status: AC

## 2011-07-26 MED ORDER — DIAZEPAM 5 MG PO TABS
5.0000 mg | ORAL_TABLET | ORAL | Status: DC
  Filled 2011-07-26: qty 1

## 2011-07-26 MED ORDER — CARVEDILOL 3.125 MG PO TABS
3.1250 mg | ORAL_TABLET | Freq: Two times a day (BID) | ORAL | Status: DC
  Administered 2011-07-28 – 2011-08-05 (×15): 3.125 mg via ORAL
  Filled 2011-07-26 (×24): qty 1

## 2011-07-26 MED ORDER — HEPARIN (PORCINE) IN NACL 2-0.9 UNIT/ML-% IJ SOLN
INTRAMUSCULAR | Status: AC
  Filled 2011-07-26: qty 2000

## 2011-07-26 MED ORDER — ASPIRIN EC 325 MG PO TBEC
325.0000 mg | DELAYED_RELEASE_TABLET | Freq: Every day | ORAL | Status: DC
  Administered 2011-07-26 – 2011-08-12 (×18): 325 mg via ORAL
  Filled 2011-07-26 (×18): qty 1

## 2011-07-26 MED ORDER — INFLUENZA VIRUS VACC SPLIT PF IM SUSP
0.5000 mL | INTRAMUSCULAR | Status: AC
  Administered 2011-07-27: 0.5 mL via INTRAMUSCULAR
  Filled 2011-07-26: qty 0.5

## 2011-07-26 MED ORDER — ASPIRIN 81 MG PO CHEW
324.0000 mg | CHEWABLE_TABLET | Freq: Once | ORAL | Status: AC
  Administered 2011-07-26: 324 mg via ORAL
  Filled 2011-07-26: qty 4

## 2011-07-26 MED ORDER — INSULIN ASPART 100 UNIT/ML ~~LOC~~ SOLN
10.0000 [IU] | Freq: Once | SUBCUTANEOUS | Status: AC
  Administered 2011-07-26: 10 [IU] via SUBCUTANEOUS

## 2011-07-26 MED ORDER — SODIUM CHLORIDE 0.9 % IJ SOLN: 3.0000 mL | Freq: Two times a day (BID) | INTRAMUSCULAR | Status: DC

## 2011-07-26 MED ORDER — METOPROLOL TARTRATE 25 MG/10 ML ORAL SUSPENSION
25.0000 mg | Freq: Two times a day (BID) | ORAL | Status: DC
  Administered 2011-07-26: 25 mg via ORAL
  Filled 2011-07-26 (×6): qty 10

## 2011-07-26 MED ORDER — CARVEDILOL 6.25 MG PO TABS
6.2500 mg | ORAL_TABLET | Freq: Two times a day (BID) | ORAL | Status: DC
  Filled 2011-07-26 (×2): qty 1

## 2011-07-26 MED ORDER — MIDAZOLAM HCL 2 MG/2ML IJ SOLN
INTRAMUSCULAR | Status: AC
  Filled 2011-07-26: qty 2

## 2011-07-26 MED ORDER — SODIUM CHLORIDE 0.9 % IJ SOLN: 3.0000 mL | INTRAMUSCULAR | Status: DC | PRN

## 2011-07-26 MED ORDER — ONDANSETRON HCL 4 MG/2ML IJ SOLN
4.0000 mg | Freq: Four times a day (QID) | INTRAMUSCULAR | Status: DC | PRN
  Administered 2011-07-27 – 2011-08-07 (×9): 4 mg via INTRAVENOUS
  Filled 2011-07-26 (×9): qty 2

## 2011-07-26 MED ORDER — HEPARIN (PORCINE) IN NACL 100-0.45 UNIT/ML-% IJ SOLN
1050.0000 [IU]/h | INTRAMUSCULAR | Status: DC
  Administered 2011-07-26: 1200 [IU]/h via INTRAVENOUS
  Filled 2011-07-26 (×3): qty 250

## 2011-07-26 MED ORDER — NITROGLYCERIN 0.2 MG/ML ON CALL CATH LAB
INTRAVENOUS | Status: AC
  Filled 2011-07-26: qty 1

## 2011-07-26 MED ORDER — SODIUM CHLORIDE 0.9 % IJ SOLN
3.0000 mL | Freq: Two times a day (BID) | INTRAMUSCULAR | Status: DC
  Administered 2011-07-27 – 2011-07-28 (×3): 3 mL via INTRAVENOUS

## 2011-07-26 MED ORDER — ROSUVASTATIN CALCIUM 40 MG PO TABS
40.0000 mg | ORAL_TABLET | Freq: Every day | ORAL | Status: DC
  Administered 2011-07-26 – 2011-08-11 (×16): 40 mg via ORAL
  Filled 2011-07-26 (×19): qty 1

## 2011-07-26 MED ORDER — SODIUM CHLORIDE 0.9 % IV SOLN: 1.0000 mL/kg/h | INTRAVENOUS | Status: DC

## 2011-07-26 MED ORDER — NITROGLYCERIN IN D5W 200-5 MCG/ML-% IV SOLN: 10.0000 ug/min | INTRAVENOUS | Status: DC

## 2011-07-26 NOTE — Progress Notes (Signed)
[facility] progress note. Dictated for Dr. Wynelle Cleveland. Chief complaint. Abnormal cardiac enzymes. History of present illness This 60 year old female presented and was admitted with a several months of nausea thought to be due to biliary colic. She had associated poor oral intake over that time period am unclear about weight loss. Patient presented in DKA. Although she has an incidence of hypoglycemia more recently was hyperglycemic in the 300s. She was treated with glucose stabilizer and her DKA has resolved. She was noted to have EKG changes with inferior Q waves and lateral ST depressions as documented. There has never been complaints of chest pain though she has had soma right upper quadrant abdominal pain she tells me. She states she has no cardiac history and has never seen a cardiologist in consult. Her cardiac enzymes have been cycled and the first set shows MB elevated 30.9, total CK elevated 383, and her troponin elevated 6.58. Vital signs. Temperature 98.2, pulse 86, respiration 19, blood pressure 106/71. O2 sats 100%. Gen. appearance. Somewhat obese elderly female in no distress. Alert and oriented. Cardiac. Rate and rhythm regular. She does have peripheral pitting edema.Heart  sounds are distant. No murmur appreciated. I see no jugular venous distention. Lungs. Breath sounds are distant but clear. No crackles appreciated. Abdomen. Obese with positive bowel sounds. Mild diffuse pain with palpation but no guarding or rebound tenderness. Her pain does not appear any greater in the right upper quadrant and it does and the rest of her abdomen. Impressions and plan. #1. Abnormal cardiac enzymes. Specifically troponin elevated 6.58. She did show some EKG showed changes on admission. There are no further EKGs more distantly for comparison. This discussed the case with Dr. Tempie Hoist. I will stop her prophylactic heparin and start her on heparin drip with the pharmacy to dose. Also for tablets 81 mg  of aspirin now and then aspirin enteric-coated 325 mg daily. Start her on a beta blocker with Lopressor 25 mg twice daily. Also Crestor 20 mg daily as a statin. She does look mildly volume overloaded I will decrease her fluid rate to 50 cc per hour. Will check a chest x-ray portably in the a.m. Will check a 12-lead EKG now. Dr. Britta Mccreedy and says he will see the patient later this a.m. I will update the rounding physician Dr. Wynelle Cleveland. As far as I can tell, the patient has at no time during this hospitalization expressed any problems with chest pain.

## 2011-07-26 NOTE — Op Note (Addendum)
Cardiac Catheterization Procedure Note  Name: Bridget Martinez MRN: [id number] DOB: 11-22-1950  Procedure: Left Heart Cath, Selective Coronary Angiography, LV angiography  Indication: ACS   Procedural Details: The right wrist was prepped, draped, and anesthetized with 1% lidocaine. Using the modified Seldinger technique, a 5 French sheath was introduced into the right radial artery. 3 mg of verapamil was administered through the sheath, weight-based unfractionated heparin was administered intravenously. Standard Judkins catheters were used for selective coronary angiography.  Left ventriculography was not done due to elevated LVEDP and CKD. Catheter exchanges were performed over an exchange length guidewire. There were no immediate procedural complications. A TR band was used for radial hemostasis at the completion of the procedure.  The patient was transferred to the post catheterization recovery area for further monitoring.  Procedural Findings: Hemodynamics: AO 105/60 LV 102/31  Coronary angiography: Coronary dominance: right  Left mainstem: Short vessel with minimal disease.   Left anterior descending (LAD): The proximal to mid LAD was heavily calcified and diffusely diseased, reaching about 70% stenosis at the 1st diagonal take-off and about 80% stenosis in the mid-LAD after the 2nd diagonal.  There appeared to be about 80% stenosis of the ostial 1st diagonal.   Left circumflex (LCx): 80% proximal LCx stenosis after a small 1st obtuse marginal.  The obtuse marginal had diffuse up to 50% proximal stenosis.  There was a large 2nd obtuse marginal with 95% proximal stenosis.  The AV circumflex was subtotally occluded after the 2nd obtuse marginal.   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  The distal RCA and PDA reconstituted via collaterals from the LAD and the LCx.    Left ventriculography: Not done due to elevated LV EDP.   Final Conclusions:  Diffuse 3  vessel disease. The proximal RCA was likely occluded recently but not acutely (cardiac enzymes elevated but there are collaterals to the RCA territory).  Given her history, it is hard to determine exactly when the acute event would have occurred.  The LCx system has severe disease.  The LAD does not have a single critical lesion but is diffusely diseased and calcified throughout the proximal and mid sections of the the vessel, reaching up to 80% stenosis.  I suspect that she could be ischemic in LAD territory.  LVEDP elevated to 31 and patient has mild creatinine elevation, so I did not do an LV-gram.    Recommendations: I have talked to CVTS, they will evaluate her in the morning.  I think that CABG is the best choice here (diffuse disease, diabetic, LV dysfunction). EF 25-30% by echo.  I will keep IV fluids KVO given elevated LV EDP and start Lasix IV in the morning tomorrow.  I will stop metoprolol and use Coreg instead.   Loralie Champagne 07/26/2011, 4:24 PM

## 2011-07-26 NOTE — Progress Notes (Signed)
ANTICOAGULATION CONSULT NOTE - Initial Consult  Pharmacy Consult for heparin Indication: chest pain/ACS  Allergies  Allergen Reactions  . Sulfa Antibiotics Anaphylaxis and Swelling    Patient Measurements: Height: 5\' 7"  (170.2 cm) Weight: 213 lb 3 oz (96.7 kg) IBW/kg (Calculated) : 61.6  Adjusted Body Weight: 82.9  Vital Signs: Temp: 97.5 F (36.4 C) (11/16 0208) Temp src: Oral (11/16 0208) BP: 108/67 mmHg (11/16 0208) Pulse Rate: 85  (11/16 0208)  Labs:  Basename 07/26/11 0020 07/26/11 0019 07/25/11 1723 07/25/11 1454 07/25/11 1208 07/25/11 0219 07/25/11 0152  HGB 14.6 -- -- -- -- 17.7* --  HCT 41.4 -- -- -- -- 52.0* 44.6  PLT 226 -- -- -- -- -- 247  APTT -- -- -- -- -- -- --  LABPROT -- -- -- -- -- -- --  INR -- -- -- -- -- -- --  HEPARINUNFRC -- -- -- -- -- -- --  CREATININE -- -- 1.34* 1.33* 1.33* -- --  CKTOTAL -- 383* -- -- -- -- --  CKMB -- 30.9* -- -- -- -- --  TROPONINI -- 6.58* -- -- -- -- --   Estimated Creatinine Clearance: 52.6 ml/min (by C-G formula based on Cr of 1.34).  Medical History: Past Medical History  Diagnosis Date  . Diabetes mellitus     on insulin, with h/o DKA   . HTN (hypertension)   . Hyperlipidemia   . Venous stasis ulcers   . Q waves suggestive of previous myocardial infarction     Pt denies h/o AMI, cath, PCI  . Angina   . Tuberculosis   . Hyperthyroidism     Medications:  Scheduled:    . aspirin  324 mg Oral Once  . aspirin EC  325 mg Oral Daily  . escitalopram  10 mg Oral Daily  . HYDROmorphone      .  HYDROmorphone (DILAUDID) injection  0.5 mg Intravenous Once  . influenza  inactive virus vaccine  0.5 mL Intramuscular Tomorrow-1000  . insulin aspart  0-5 Units Subcutaneous QHS  . insulin aspart  0-9 Units Subcutaneous TID WC  . insulin glargine  15 Units Subcutaneous QHS  . insulin glargine  20 Units Subcutaneous Daily  . insulin (NOVOLIN-R) infusion   Intravenous To Major  . levothyroxine  100 mcg Oral Daily  .  metoprolol tartrate  25 mg Oral BID  . ondansetron  4 mg Intravenous Once  . ondansetron (ZOFRAN) IV  4 mg Intravenous Q6H  . pantoprazole (PROTONIX) IV  40 mg Intravenous Once  . pantoprazole (PROTONIX) IV  40 mg Intravenous Daily  . rosuvastatin  20 mg Oral q1800  . simvastatin  20 mg Oral Daily  . sodium chloride  2,000 mL Intravenous Once  . DISCONTD: heparin  5,000 Units Subcutaneous Q8H  . DISCONTD: HYDROmorphone  0.5 mg/hr Intravenous Once  . DISCONTD: insulin glargine  15 Units Subcutaneous QHS  . DISCONTD: potassium chloride  10 mEq Intravenous Q1H   Assessment: 60yo female admitted yesterday for DKA, also c/o RUQ pain on eating x several months, now with significantly elevated CE, to begin heparin.  Goal of Therapy:  Heparin level 0.3-0.7 units/ml   Plan:  Will give heparin bolus of 4000 units followed by gtt at 1200 units/hr.  Monitor heparin levels and CBC.  Rogue Bussing PharmD BCPS 07/26/2011,2:17 AM

## 2011-07-26 NOTE — Progress Notes (Signed)
ANTICOAGULATION CONSULT NOTE - Follow Up Consult  Pharmacy Consult for heparin Indication: chest pain/ACS  Allergies  Allergen Reactions  . Sulfa Antibiotics Anaphylaxis and Swelling    Patient Measurements: Height: 5\' 7"  (170.2 cm) Weight: 213 lb 3 oz (96.7 kg) IBW/kg (Calculated) : 61.6  Adjusted Body Weight:   Vital Signs: Temp: 98.2 F (36.8 C) (11/16 0238) Temp src: Oral (11/16 0238) BP: 122/70 mmHg (11/16 0352) Pulse Rate: 88  (11/16 0352)  Labs:  Basename 07/26/11 0900 07/26/11 0020 07/26/11 0019 07/25/11 1723 07/25/11 1454 07/25/11 1208 07/25/11 0219 07/25/11 0152  HGB 15.0 14.6 -- -- -- -- -- --  HCT 42.9 41.4 -- -- -- -- 52.0* --  PLT 224 226 -- -- -- -- -- 247  APTT -- -- -- -- -- -- -- --  LABPROT -- -- -- -- -- -- -- --  INR -- -- -- -- -- -- -- --  HEPARINUNFRC 0.89* -- -- -- -- -- -- --  CREATININE -- -- -- 1.34* 1.33* 1.33* -- --  CKTOTAL 311* -- 383* -- -- -- -- --  CKMB 20.3* -- 30.9* -- -- -- -- --  TROPONINI 4.60* -- 6.58* -- -- -- -- --   Estimated Creatinine Clearance: 52.6 ml/min (by C-G formula based on Cr of 1.34).   Medications:  Prescriptions prior to admission  Medication Sig Dispense Refill  . atorvastatin (LIPITOR) 20 MG tablet Take by mouth daily. Pt doesn't know dose       . escitalopram (LEXAPRO) 10 MG tablet Take 10 mg by mouth daily.        . insulin detemir (LEVEMIR) 100 UNIT/ML injection Inject 25 Units into the skin every morning.        . insulin lispro (HUMALOG) 100 UNIT/ML injection Inject 5 Units into the skin 3 (three) times daily before meals.        Marland Kitchen levothyroxine (SYNTHROID, LEVOTHROID) 100 MCG tablet Take 100 mcg by mouth daily.        . metFORMIN (GLUCOPHAGE) 1000 MG tablet Take 1,000 mg by mouth 2 (two) times daily with a meal.        . PRESCRIPTION MEDICATION Blood pressure medication        . PRESCRIPTION MEDICATION Cholesterol medication       . ramipril (ALTACE) 10 MG capsule Take by mouth daily. Pt doesn't  know dose      . insulin detemir (LEVEMIR) 100 UNIT/ML injection Inject 20 Units into the skin at bedtime as needed. For high blood sugar         Assessment: 60 yo female with ACS / elevated troponin is currently on supratherapeutic heparin.  Cath today? Goal of Therapy:  Heparin level 0.3-0.7 units/ml   Plan:  1) Decrease heparin to 1050 units/hr (= 10.43ml/hr) 2) 6hr heparin level if not going to cath today. 3) Follow up after cath if go  Aniketh Huberty, Tsz-Yin 07/26/2011,10:28 AM

## 2011-07-26 NOTE — Progress Notes (Signed)
Attempted to see patient and patient gone to cath lab. Left recommendations and education instruction for RN. Raoul Pitch RN, BSN Diabetes Coordinator

## 2011-07-26 NOTE — Progress Notes (Addendum)
Inpatient Diabetes Program Recommendations  AACE/ADA: New Consensus Statement on Inpatient Glycemic Control (2009)  Target Ranges:  Prepandial:   less than 140 mg/dL      Peak postprandial:   less than 180 mg/dL (1-2 hours)      Critically ill patients:  140 - 180 mg/dL   Reason for Visit: Hyperglycemia CBG=366   Pt has not received a dose of basal insulin yet. Hopefully once Lantus is given CBGs will come down.   Entered patient education orders for this patient.  A1C=8.9

## 2011-07-26 NOTE — Progress Notes (Signed)
*  PRELIMINARY RESULTS* Echocardiogram 2D Echocardiogram has been performed.  [place], [place] 07/26/2011, 11:32 AM

## 2011-07-26 NOTE — Progress Notes (Signed)
CRITICAL VALUE ALERT  Critical value received:CKMB 30,9 TROPONIN 6.58  Date of notification: 09/24/10   Time of notification:  0127  Critical value read back:yes  Nurse who received alert: Deeann Dowse RN  MD notified (1st page): Fredirick Maudlin , T NP Time of first page:0130  MD notified (2nd page):  Time of second page:  Responding MD: Fredirick Maudlin NP Time MD responded:  20 Fredirick Maudlin will be up to assess pt.

## 2011-07-26 NOTE — Progress Notes (Addendum)
Subjective: Patient is focused on the fact that she has a for cardiac catheterization this afternoon. She expresses anxiety about this idea. Her family is at the bedside. As her vision my examination cardiac cath personnel here to pick her up to transport her to cardiac catheterization. She denies any other symptoms except her anxiety. Noted is the fact that the patient had a blood sugar of 339 and was given Lantus by the cardiology service. Objective: Filed Vitals:   07/26/11 1645 07/26/11 1700 07/26/11 1715 07/26/11 1730  BP: 88/60 91/46 81/58  78/49  Pulse: 67 66 66 69  Temp:   97.7 F (36.5 C)   TempSrc:   Oral   Resp: 15 16 18 19   Height:      Weight:      SpO2: 100% 100% 100% 100%   Weight change:   Intake/Output Summary (Last 24 hours) at 07/26/11 1847 Last data filed at 07/26/11 0617  Gross per 24 hour  Intake      0 ml  Output    300 ml  Net   -300 ml    General: Alert, awake, oriented x3, in no acute distress.  HEENT: [place]/AT PEERL, EOMI Neck: Trachea midline,  no masses, no thyromegal,y no JVD, no carotid bruit OROPHARYNX:  Moist, No exudate/ erythema/lesions.  Heart: Regular rate and rhythm,1-2/6 SEM, no rubs or gallops, PMI non-displaced, no heaves or thrills on palpation.  Lungs: Clear to auscultation, no wheezing or rho no nchi noted. No increased vocal fremitus resonant to percussion  Abdomen: Soft, nontender, nondistended, positive bowel sounds, no masses no hepatosplenomegaly noted..  Neuro: No focal neurological deficits noted cranial nerves II through XII grossly intact. DTRs 2+ bilaterally upper and lower extremities. Strength 5 out of 5 in bilateral upper and lower extremities. Musculoskeletal: No warm swelling or erythema around joints, no spinal tenderness noted. Psychiatric: Patient alert and oriented x3, good insight and cognition, good recent to remote recall. Lymph node survey: No cervical axillary or inguinal lymphadenopathy noted. Skin: The patient has  venostasis and bilateral lower extremities characterized by hyperpigmentation and thickening of the skin.    Lab Results:  [facility] 07/25/11 1723 07/25/11 1454  NA 134* 135  K 3.9 3.8  CL 98 99  CO2 22 21  GLUCOSE 128* 100*  BUN 22 21  CREATININE 1.34* 1.33*  CALCIUM 8.2* 8.1*  MG -- --  PHOS -- --    Basename 07/26/11 0020  AST 93*  ALT 30  ALKPHOS 146*  BILITOT 0.8  PROT 5.3*  ALBUMIN 2.6*   No results found for this basename: LIPASE:2,AMYLASE:2 in the last 72 hours  Basename 07/26/11 0900 07/26/11 0020 07/25/11 0152  WBC 9.7 11.1* --  NEUTROABS -- -- 12.9*  HGB 15.0 14.6 --  HCT 42.9 41.4 --  MCV 84.1 83.1 --  PLT 224 226 --    Basename 07/26/11 0900 07/26/11 0019  CKTOTAL 311* 383*  CKMB 20.3* 30.9*  CKMBINDEX -- --  TROPONINI 4.60* 6.58*   No results found for this basename: POCBNP:3 in the last 72 hours No results found for this basename: DDIMER:2 in the last 72 hours  Basename 07/25/11 1723  HGBA1C 8.9*    Basename 07/26/11 0310  CHOL 131  HDL 58  LDLCALC 35  TRIG 189*  CHOLHDL 2.3  LDLDIRECT --    Basename 07/26/11 0020  TSH 5.723*  T4TOTAL --  T3FREE --  THYROIDAB --   No results found for this basename: VITAMINB12:2,FOLATE:2,FERRITIN:2,TIBC:2,IRON:2,RETICCTPCT:2 in the last 72  hours  Micro Results: No results found for this or any previous visit (from the past 240 hour(s)).  Studies/Results: Dg Chest 1 View  07/25/2011  *RADIOLOGY REPORT*  Clinical Data: Nausea; hyperglycemia.  Leukocytosis.  CHEST - 1 VIEW  Comparison: None.  Findings: The lungs are well-aerated.  Mild left mid lung linear atelectasis noted.  There is no evidence of pleural effusion or pneumothorax.  The cardiomediastinal silhouette is enlarged.  No acute osseous abnormalities are seen.  IMPRESSION: Mild left mid lung linear atelectasis; cardiomegaly noted.  Original Report Authenticated By: Santa Lighter, M.D.   US Abdomen Complete  07/25/2011  *RADIOLOGY  REPORT*  Clinical Data:  Right upper quadrant pain, nausea/vomiting, leukocytosis  COMPLETE ABDOMINAL ULTRASOUND  Comparison:  None.  Findings:  Gallbladder:  No gallstones, gallbladder wall thickening, or pericholecystic fluid.  Negative sonographic Murphy's sign.  Common bile duct:  Measures 5 mm.  Liver:  No focal lesion identified.  Within normal limits in parenchymal echogenicity.  IVC:  Appears normal.  Pancreas:  Visualized portions who was limits.  Spleen:  Measures 6.9 cm.  Right Kidney:  Measures 12.7 cm.  13 x 9 x 9 mm interpolar cyst. No hydronephrosis.  Left Kidney:  Measures 11.2 cm.  No mass or hydronephrosis.  Abdominal aorta:  No aneurysm identified.  Additional comments:  Tiny right pleural effusion.  IMPRESSION: Normal sonographic appearance of the gallbladder.  1.3 cm right renal cyst.  Tiny right pleural effusion.  Original Report Authenticated By: Julian Hy, M.D.   Dg Chest Port 1 View  07/26/2011  *RADIOLOGY REPORT*  Clinical Data: Shortness of breath, congestive heart failure  PORTABLE CHEST - 1 VIEW  Comparison: Portable chest x-ray of 07/25/2011  Findings: Minimal linear atelectasis or scarring in the left mid lung is stable.  No active infiltrate or effusion is seen.  There is no evidence of congestive heart failure.  Cardiomegaly is stable.  No bony abnormality is seen.  IMPRESSION: Stable cardiomegaly and linear atelectasis or scarring in the left mid lung base.  No CHF.  Original Report Authenticated By: Joretta Bachelor, M.D.    Medications: I have reviewed the patient's current medications. Scheduled Meds:   . aspirin  324 mg Oral Once  . aspirin  324 mg Oral Pre-Cath  . aspirin EC  325 mg Oral Daily  . carvedilol  3.125 mg Oral BID WC  . diazepam  5 mg Oral On Call  . escitalopram  10 mg Oral Daily  . fentaNYL      . furosemide  40 mg Intravenous BID  . heparin  4,000 Units Intravenous Once  . heparin      . heparin      . HYDROmorphone      .  HYDROmorphone  (DILAUDID) injection  0.5 mg Intravenous Once  . influenza  inactive virus vaccine  0.5 mL Intramuscular Tomorrow-1000  . insulin aspart  0-5 Units Subcutaneous QHS  . insulin aspart  0-9 Units Subcutaneous TID WC  . insulin aspart  10 Units Subcutaneous Once  . insulin glargine  15 Units Subcutaneous QHS  . insulin glargine  20 Units Subcutaneous Daily  . levothyroxine  100 mcg Oral Daily  . lidocaine      . living well with diabetes book   Does not apply Once  . metoprolol tartrate  25 mg Oral BID  . midazolam      . nitroGLYCERIN      . ondansetron (ZOFRAN) IV  4 mg Intravenous  Q6H  . pantoprazole (PROTONIX) IV  40 mg Intravenous Once  . pantoprazole (PROTONIX) IV  40 mg Intravenous Daily  . rosuvastatin  40 mg Oral q1800  . sodium chloride  3 mL Intravenous Q12H  . sodium chloride  3 mL Intravenous Q12H  . verapamil      . DISCONTD: aspirin EC  325 mg Oral Daily  . DISCONTD: carvedilol  6.25 mg Oral BID WC  . DISCONTD: heparin  5,000 Units Subcutaneous Q8H  . DISCONTD: HYDROmorphone  0.5 mg/hr Intravenous Once  . DISCONTD: insulin glargine  15 Units Subcutaneous QHS  . DISCONTD: potassium chloride  10 mEq Intravenous Q1H  . DISCONTD: rosuvastatin  20 mg Oral q1800  . DISCONTD: rosuvastatin  20 mg Oral q1800  . DISCONTD: simvastatin  20 mg Oral Daily   Continuous Infusions:   . sodium chloride 20 mL/hr at 07/26/11 1700  . sodium chloride    . sodium chloride    . sodium chloride    . heparin 1,200 Units/hr (07/26/11 0331)  . nitroGLYCERIN    . DISCONTD: sodium chloride    . DISCONTD: sodium chloride    . DISCONTD: heparin    . DISCONTD: insulin (NOVOLIN-R) infusion     PRN Meds:.acetaminophen, dextrose, ondansetron (ZOFRAN) IV, sodium chloride, sodium chloride, DISCONTD: ondansetron (ZOFRAN) IV Assessment/Plan: Patient Active Hospital Problem List:  diabetes type 2 uncontrolled: The patient presented in DKA. She was transitioned from a glucose drip over to a bolus and  both her Lantus. However her blood sugars are still elevated patient needs further titration of her Lantus. My only concern today is that the patient is n.p.o. for her cardiac catheterization and has received a dose of Lantus a little today. She will need to be watched closely to ensure that she is not having hypoglycemia. HTN (hypertension) ()   Assessment: Patient hypotensive I have made the cardiologist aware of this and she is on her way to cardiac cath. I will likely need IV fluids. She'll be reassessed after cardiac catheterization.   NSTEMI: Patient was evaluated by cardiology and is on her way to cardiac cath for further evaluation of coronary artery vessels.     LOS: 1 day  Noted from procedure notes of cardiac catheter patient has significant coronary artery disease and cardiothoracic surgery has been asked to evaluate the patient for revascularization procedure.

## 2011-07-26 NOTE — Consult Note (Signed)
CARDIOLOGY CONSULT NOTE  Patient ID: Bridget Martinez MRN: [id number] DOB/AGE: March 28, 1951 60 y.o.  Admit date: 07/25/2011 Referring Physician  [facility] Primary Physician  [facility]  Primary Cardiologist  none Reason for Consultation  NSTEMI   *[id number] Bridget Martinez is a 60 y.o. female without a history of obesity, HL, DM2. No known h/o CAD. Pt had onset nausea about 1 week ago. Vomiting began 11-13. No CP/abd pain. Abd pain began 11-13. Pt came to hosp Weds pm because CBG was very high (>600). In ER, CBG followed and became better controlled. Pt was noted to have abnl ECG, ez were checked and abnl so cards to see. Pt has never had CP in her life.Pt notes DOE last 3-4 weeks but not previously. Generally sedentary but was able to walk her dog w/out difficulty until acute illness this week.   Since in ER, pt CBG improved, she had Rx for nausea and 1 dose Dilaudid. Had prolonged course in ER for what they thought were biliary colic and then eventually transferred to floor. On arrival to floor, cardiac markers drawn and Trop came back as 6. With MB 30.  Started on ASA, nitrates, b-blocker, heparin and statin.  Now feels much better. RUQ u/s is normal.   Past Medical History  Diagnosis Date  . Diabetes mellitus     on insulin, with h/o DKA   . HTN (hypertension)   . Hyperlipidemia   . Venous stasis ulcers   . Q waves suggestive of previous myocardial infarction     Pt denies h/o AMI, cath, PCI  . Angina   . Tuberculosis   . Hyperthyroidism     Past Surgical History  Procedure Date  . No past surgeries     Allergies  Allergen Reactions  . Sulfa Antibiotics Anaphylaxis and Swelling   I have reviewed the patient's current medications. Scheduled:   . aspirin  324 mg Oral Once  . aspirin EC  325 mg Oral Daily  . escitalopram  10 mg Oral Daily  . heparin  4,000 Units Intravenous Once  . HYDROmorphone      .  HYDROmorphone (DILAUDID) injection  0.5 mg Intravenous Once  .  influenza  inactive virus vaccine  0.5 mL Intramuscular Tomorrow-1000  . insulin aspart  0-5 Units Subcutaneous QHS  . insulin aspart  0-9 Units Subcutaneous TID WC  . insulin glargine  15 Units Subcutaneous QHS  . insulin glargine  20 Units Subcutaneous Daily  . insulin (NOVOLIN-R) infusion   Intravenous To Major  . levothyroxine  100 mcg Oral Daily  . metoprolol tartrate  25 mg Oral BID  . ondansetron  4 mg Intravenous Once  . ondansetron (ZOFRAN) IV  4 mg Intravenous Q6H  . pantoprazole (PROTONIX) IV  40 mg Intravenous Once  . pantoprazole (PROTONIX) IV  40 mg Intravenous Daily  . rosuvastatin  20 mg Oral q1800                  . sodium chloride 100 mL/hr at 07/25/11 1908  . heparin 1,200 Units/hr (07/26/11 0331)  . nitroGLYCERIN    . DISCONTD: sodium chloride    . DISCONTD: sodium chloride    . DISCONTD: dextrose 5 % and 0.45% NaCl Stopped (07/25/11 1716)  . DISCONTD: heparin    . DISCONTD: insulin (NOVOLIN-R) infusion       History   Social History  . Marital Status: Married    Spouse Name: Grayland Ormond [phone]; Son Rush Landmark    Number  of Children: 2  . Years of Education: N/A   Occupational History  . Retired Pharmacist, [employer].   Social History Main Topics  . Smoking status: Never Smoker   . Smokeless tobacco: Not on file  . Alcohol Use: No  . Drug Use: No  . Sexually Active: Not on file   Other Topics Concern  . Not on file   Social History Narrative  . No narrative on file    Family History  Problem Relation Age of Onset  . Heart disease Father   . Hypertension Mother   . Multiple sclerosis Father       Pt with LE wounds - started as excoriations but slow to heal, b Full 14 point review of systems complete and found to be negative unless listed  Above. Specifically denies: fevers, chills, orthopnea, PND, syncope, rash, focal neuro deficits. + depression  Physical Exam: Blood pressure 122/70, pulse 88, temperature 98.2 F (36.8 C), temperature  source Oral, resp. rate 19, height 5\' 7"  (1.702 m), weight 213 lb 3 oz (96.7 kg), SpO2 100.00%.   General: Well developed, well nourished, in no acute distress Head: Eyes PERRLA, No xanthomas.   Normocephalic and atraumatic, oropharynx without edema or exudate Lungs: Clear bilaterally to auscultation and percussion. Heart: HRRR S1 S2, without M/R/G.  Pulses are 2+ & equal in both upper extrem.  Cap refill WNL in lower extrem but only 1+pulses. Neck:  No carotid bruit. No JVD. No lymphadenopathy Abdomen: Bowel sounds present, abdomen soft and non-tender without masses or                  Hernias noted. Msk:  No spine or cva tenderness. Normal strength and tone for age, no joint deformities or effusions. Extremities: No clubbing, cyanosis or edema.  Neuro: Alert and oriented X 3. No focal deficits noted. Psych:  Good affect, responds appropriately Skin :  No rashes noted. Wounds dressed and not disturbed  Labs: [facility] 07/26/11 0020 07/25/11 1723 07/25/11 1454 07/25/11 0219  NA  134* 135 131  K  3.9 3.8 4.8  CL  98 99 101  CO2  22 21 11   GLUCOSE  128* 100* 610  BUN  22 21 24   CREATININE  1.34* 1.33* 1.6  CALCIUM  8.2* 8.1*   MG  -- --   PHOS  -- --     Basename 07/26/11 0020  AST 93*  ALT 30  ALKPHOS 146*  BILITOT 0.8  PROT 5.3*  ALBUMIN 2.6*   No results found for this basename: LIPASE:2,AMYLASE:2 in the last 72 hours  Basename 07/26/11 0020 07/25/11 0219 07/25/11 0152  WBC 11.1* -- 15.5*  NEUTROABS -- -- 12.9*  HGB 14.6 17.7* --  HCT 41.4 52.0* --  MCV 83.1 -- 87.3  PLT 226 -- 247    Basename 07/26/11 0019  CKTOTAL 383*  CKMB 30.9*  CKMBINDEX --  TROPONINI 6.58*      Cholesterol  131    Triglycerides  189    HDL  58    LDL Cholesterol  35     VLDL  38    Total CHOL/HDL Ratio  2.3      Radiology:  CHEST - 1 VIEW *IMPRESSION: Mild left mid lung linear atelectasis; cardiomegaly noted.  ABD [place]: IMPRESSION: Normal sonographic appearance of the  gallbladder. 1.3 cm right renal cyst. Tiny right pleural effusion.  EKG:  SR, 84 BPM with inferior Q waves and minimal diffuse ST depression -  no old available.  ASSESSMENT: 1. NSTEMI with inferior Qs on ECG 2. Obesity 3. Hyperlipidemia 4. DM2 5. CKD, stage III 6. H/o depression   PLAN:   The patient was seen today by Dr Haroldine Laws, the patient evaluated and the data reviewed. She is a 60yo female with no previous cardiac history who was admitted with abd pain and found to have elevated enzymes. She has had nausea but not chest pain. With NSTEMI by ez, needs cath. The risks and benefits were discussed with the patient who agrees to proceed. She is on heparin, ASA, BB, statin and we will add nitrates as well. Will not start DAPT given risk of 3v-dz and fact that she is now pain free.  Signed: Rosaria Ferries 07/26/2011, 6:20 AM Co-Sign MD  Patient seen and examined with Rosaria Ferries, PA. We discussed all aspects of the encounter. I agree with the assessment and plan as stated above including my edits.  Glori Bickers, MD 7:42 AM

## 2011-07-26 NOTE — Progress Notes (Signed)
Utilization Review Completed.Donne Anon T11/16/2012

## 2011-07-26 NOTE — Consult Note (Signed)
                   [address]            [place] [address]          [phone]     CONSULT NOTE   60 Y/O wf  OBESE DIABETIC WITH chf SX'S SINCE LAST SUMMER. Admitted w/ DKA,cellulitis of venous stasis ulcers BOTH LEGS Enzymes mildly + and echo w/ severe LV dysfunction, mod-severe MR,TR--  CXR w/ biventricular dilatation L heart cath, w/ chronic RCA occlusion,.80 LAD.Marland Kitchen90Circ  LVEDP 31---no V-gram,no RHC   On exam she is obese ,sluggish w/ fairly severe bilat stasis ulcers.No audible murmur,no carotid bruit,  Poor dentition.   IMP-RECOMMENDATION:  The LV dysfunction is severe and seems out of proportion to the CAD. She appears to be a poor CABG candidate currently but to complete the eval she will need a RHC and MRI viability study. Would start antibiotics for her leg ulcers and apply topical silver guaze dressings by wound management  WILL follow but currently not a CABG candidate  Tharon Aquas Trigt,MD

## 2011-07-27 DIAGNOSIS — I2510 Atherosclerotic heart disease of native coronary artery without angina pectoris: Secondary | ICD-10-CM | POA: Diagnosis present

## 2011-07-27 DIAGNOSIS — E1029 Type 1 diabetes mellitus with other diabetic kidney complication: Secondary | ICD-10-CM | POA: Diagnosis present

## 2011-07-27 DIAGNOSIS — M7989 Other specified soft tissue disorders: Secondary | ICD-10-CM

## 2011-07-27 LAB — GLUCOSE, CAPILLARY
Glucose-Capillary: 144 mg/dL — ABNORMAL HIGH (ref 70–99)
Glucose-Capillary: 215 mg/dL — ABNORMAL HIGH (ref 70–99)
Glucose-Capillary: 141 mg/dL — ABNORMAL HIGH (ref 70–99)
Glucose-Capillary: 110 mg/dL — ABNORMAL HIGH (ref 70–99)

## 2011-07-27 LAB — BASIC METABOLIC PANEL
Calcium: 6.9 mg/dL — ABNORMAL LOW (ref 8.4–10.5)
Creatinine, Ser: 0.87 mg/dL (ref 0.50–1.10)
GFR calc non Af Amer: 70 mL/min — ABNORMAL LOW (ref >90)
Sodium: 133 mEq/L — ABNORMAL LOW (ref 135–145)

## 2011-07-27 LAB — CBC
MCHC: 33.5 g/dL (ref 30.0–36.0)
RDW: 19.1 % — ABNORMAL HIGH (ref 11.5–15.5)

## 2011-07-27 MED ORDER — POTASSIUM CHLORIDE CRYS ER 20 MEQ PO TBCR
40.0000 meq | EXTENDED_RELEASE_TABLET | Freq: Once | ORAL | Status: AC
  Administered 2011-07-27: 40 meq via ORAL
  Filled 2011-07-27: qty 2

## 2011-07-27 MED ORDER — DOXYCYCLINE HYCLATE 100 MG PO TABS
100.0000 mg | ORAL_TABLET | Freq: Two times a day (BID) | ORAL | Status: DC
  Administered 2011-07-27 – 2011-08-03 (×16): 100 mg via ORAL
  Filled 2011-07-27 (×19): qty 1

## 2011-07-27 MED ORDER — INSULIN GLARGINE 100 UNIT/ML ~~LOC~~ SOLN
20.0000 [IU] | Freq: Every day | SUBCUTANEOUS | Status: DC
  Administered 2011-07-27 – 2011-07-31 (×4): 20 [IU] via SUBCUTANEOUS

## 2011-07-27 MED ORDER — POTASSIUM CHLORIDE CRYS ER 20 MEQ PO TBCR
40.0000 meq | EXTENDED_RELEASE_TABLET | Freq: Every day | ORAL | Status: DC
  Filled 2011-07-27: qty 2

## 2011-07-27 MED ORDER — FUROSEMIDE 40 MG PO TABS
40.0000 mg | ORAL_TABLET | Freq: Every day | ORAL | Status: DC
  Administered 2011-07-28: 40 mg via ORAL
  Filled 2011-07-27: qty 1

## 2011-07-27 MED ORDER — PANTOPRAZOLE SODIUM 40 MG PO TBEC
40.0000 mg | DELAYED_RELEASE_TABLET | Freq: Every day | ORAL | Status: DC
  Administered 2011-07-28 – 2011-08-06 (×10): 40 mg via ORAL
  Filled 2011-07-27 (×10): qty 1

## 2011-07-27 MED ORDER — SODIUM CHLORIDE 0.9 % IV BOLUS (SEPSIS)
250.0000 mL | Freq: Once | INTRAVENOUS | Status: AC
  Administered 2011-07-27: 250 mL via INTRAVENOUS

## 2011-07-27 NOTE — Progress Notes (Signed)
Dr. Percival Spanish notified of patient's SBP upper 70's-80's and no urine output this shift. Patient is currently sleeping and is asymptomatic just very drowsy. Orders received to give 250cc fluid bolus of NS. Will continue to monitor. Doristine Johns [facility] 07/27/2011 1:14 AM

## 2011-07-27 NOTE — Progress Notes (Signed)
Subjective: Patient denies any chest pain or dyspnea. She says that her nausea has improved significantly.    Physical Exam: Blood pressure 108/72, pulse 75, temperature 97.7 F (36.5 C), temperature source Oral, resp. rate 18, height 5\' 7"  (1.702 m), weight 96.435 kg (212 lb 9.6 oz), SpO2 98.00%. Alert oriented x3. Distant heart sounds with systolic murmur Chest clear to auscultation bilaterally Lower extremity with +3 bilaterally edema which show ulcers with purulent base and erythema   Investigations: Results for orders placed during the hospital encounter of 07/25/11 (from the past 48 hour(s))  BASIC METABOLIC PANEL     Status: Abnormal   Collection Time   07/25/11  2:54 PM      Component Value Range Comment   Sodium 135  135 - 145 (mEq/L)    Potassium 3.8  3.5 - 5.1 (mEq/L)    Chloride 99  96 - 112 (mEq/L)    CO2 21  19 - 32 (mEq/L)    Glucose, Bld 100 (*) 70 - 99 (mg/dL)    BUN 21  6 - 23 (mg/dL)    Creatinine, Ser 1.33 (*) 0.50 - 1.10 (mg/dL)    Calcium 8.1 (*) 8.4 - 10.5 (mg/dL)    GFR calc non Af Amer 42 (*) >90 (mL/min)    GFR calc Af Amer 49 (*) >90 (mL/min)   BASIC METABOLIC PANEL     Status: Abnormal   Collection Time   07/25/11  5:23 PM      Component Value Range Comment   Sodium 134 (*) 135 - 145 (mEq/L)    Potassium 3.9  3.5 - 5.1 (mEq/L)    Chloride 98  96 - 112 (mEq/L)    CO2 22  19 - 32 (mEq/L)    Glucose, Bld 128 (*) 70 - 99 (mg/dL)    BUN 22  6 - 23 (mg/dL)    Creatinine, Ser 1.34 (*) 0.50 - 1.10 (mg/dL)    Calcium 8.2 (*) 8.4 - 10.5 (mg/dL)    GFR calc non Af Amer 42 (*) >90 (mL/min)    GFR calc Af Amer 48 (*) >90 (mL/min)   HEMOGLOBIN A1C     Status: Abnormal   Collection Time   07/25/11  5:23 PM      Component Value Range Comment   Hemoglobin A1C 8.9 (*) <5.7 (%)    Mean Plasma Glucose 209 (*) <117 (mg/dL)   GLUCOSE, CAPILLARY     Status: Abnormal   Collection Time   07/25/11  6:07 PM      Component Value Range Comment   Glucose-Capillary  129 (*) 70 - 99 (mg/dL)   GLUCOSE, CAPILLARY     Status: Abnormal   Collection Time   07/25/11  9:36 PM      Component Value Range Comment   Glucose-Capillary 145 (*) 70 - 99 (mg/dL)   GLUCOSE, CAPILLARY     Status: Abnormal   Collection Time   07/25/11 10:41 PM      Component Value Range Comment   Glucose-Capillary 158 (*) 70 - 99 (mg/dL)    Comment 1 Notify RN     CARDIAC PANEL(CRET KIN+CKTOT+MB+TROPI)     Status: Abnormal   Collection Time   07/26/11 12:19 AM      Component Value Range Comment   Total CK 383 (*) 7 - 177 (U/L)    CK, MB 30.9 (*) 0.3 - 4.0 (ng/mL)    Troponin I 6.58 (*) <0.30 (ng/mL)    Relative Index 8.1 (*)  0.0 - 2.5    TSH     Status: Abnormal   Collection Time   07/26/11 12:20 AM      Component Value Range Comment   TSH 5.723 (*) 0.350 - 4.500 (uIU/mL)   HEPATIC FUNCTION PANEL     Status: Abnormal   Collection Time   07/26/11 12:20 AM      Component Value Range Comment   Total Protein 5.3 (*) 6.0 - 8.3 (g/dL)    Albumin 2.6 (*) 3.5 - 5.2 (g/dL)    AST 93 (*) 0 - 37 (U/L)    ALT 30  0 - 35 (U/L)    Alkaline Phosphatase 146 (*) 39 - 117 (U/L)    Total Bilirubin 0.8  0.3 - 1.2 (mg/dL)    Bilirubin, Direct 0.4 (*) 0.0 - 0.3 (mg/dL)    Indirect Bilirubin 0.4  0.3 - 0.9 (mg/dL)   CBC     Status: Abnormal   Collection Time   07/26/11 12:20 AM      Component Value Range Comment   WBC 11.1 (*) 4.0 - 10.5 (K/uL)    RBC 4.98  3.87 - 5.11 (MIL/uL)    Hemoglobin 14.6  12.0 - 15.0 (g/dL) DELTA CHECK NOTED   HCT 41.4  36.0 - 46.0 (%)    MCV 83.1  78.0 - 100.0 (fL)    MCH 29.3  26.0 - 34.0 (pg)    MCHC 35.3  30.0 - 36.0 (g/dL)    RDW 18.4 (*) 11.5 - 15.5 (%)    Platelets 226  150 - 400 (K/uL)   LIPID PANEL     Status: Abnormal   Collection Time   07/26/11  3:10 AM      Component Value Range Comment   Cholesterol 131  0 - 200 (mg/dL)    Triglycerides 189 (*) <150 (mg/dL)    HDL 58  >39 (mg/dL)    Total CHOL/HDL Ratio 2.3      VLDL 38  0 - 40 (mg/dL)     LDL Cholesterol 35  0 - 99 (mg/dL)   CARDIAC PANEL(CRET KIN+CKTOT+MB+TROPI)     Status: Abnormal   Collection Time   07/26/11  9:00 AM      Component Value Range Comment   Total CK 311 (*) 7 - 177 (U/L)    CK, MB 20.3 (*) 0.3 - 4.0 (ng/mL) CRITICAL VALUE NOTED.  VALUE IS CONSISTENT WITH PREVIOUSLY REPORTED AND CALLED VALUE.   Troponin I 4.60 (*) <0.30 (ng/mL)    Relative Index 6.5 (*) 0.0 - 2.5    HEPARIN LEVEL (UNFRACTIONATED)     Status: Abnormal   Collection Time   07/26/11  9:00 AM      Component Value Range Comment   Heparin Unfractionated 0.89 (*) 0.30 - 0.70 (IU/mL)   CBC     Status: Abnormal   Collection Time   07/26/11  9:00 AM      Component Value Range Comment   WBC 9.7  4.0 - 10.5 (K/uL)    RBC 5.10  3.87 - 5.11 (MIL/uL)    Hemoglobin 15.0  12.0 - 15.0 (g/dL)    HCT 42.9  36.0 - 46.0 (%)    MCV 84.1  78.0 - 100.0 (fL)    MCH 29.4  26.0 - 34.0 (pg)    MCHC 35.0  30.0 - 36.0 (g/dL)    RDW 18.8 (*) 11.5 - 15.5 (%)    Platelets 224  150 - 400 (K/uL)   PROTIME-INR  Status: Abnormal   Collection Time   07/26/11  9:00 AM      Component Value Range Comment   Prothrombin Time 17.5 (*) 11.6 - 15.2 (seconds)    INR 1.41  0.00 - 1.49    GLUCOSE, CAPILLARY     Status: Abnormal   Collection Time   07/26/11 10:37 AM      Component Value Range Comment   Glucose-Capillary 366 (*) 70 - 99 (mg/dL)    Comment 1 Documented in Chart      Comment 2 Notify RN     GLUCOSE, CAPILLARY     Status: Abnormal   Collection Time   07/26/11  2:27 PM      Component Value Range Comment   Glucose-Capillary 399 (*) 70 - 99 (mg/dL)    Comment 1 Documented in Chart      Comment 2 Notify RN     GLUCOSE, CAPILLARY     Status: Abnormal   Collection Time   07/26/11  5:03 PM      Component Value Range Comment   Glucose-Capillary 173 (*) 70 - 99 (mg/dL)   GLUCOSE, CAPILLARY     Status: Abnormal   Collection Time   07/26/11 10:12 PM      Component Value Range Comment   Glucose-Capillary 144  (*) 70 - 99 (mg/dL)    Comment 1 Notify RN      Comment 2 Documented in Chart     CBC     Status: Abnormal   Collection Time   07/27/11  7:40 AM      Component Value Range Comment   WBC 6.9  4.0 - 10.5 (K/uL)    RBC 5.14 (*) 3.87 - 5.11 (MIL/uL)    Hemoglobin 14.7  12.0 - 15.0 (g/dL)    HCT 43.9  36.0 - 46.0 (%)    MCV 85.4  78.0 - 100.0 (fL)    MCH 28.6  26.0 - 34.0 (pg)    MCHC 33.5  30.0 - 36.0 (g/dL)    RDW 19.1 (*) 11.5 - 15.5 (%)    Platelets 200  150 - 400 (K/uL)   GLUCOSE, CAPILLARY     Status: Abnormal   Collection Time   07/27/11  8:04 AM      Component Value Range Comment   Glucose-Capillary 206 (*) 70 - 99 (mg/dL)    Comment 1 Notify RN     BASIC METABOLIC PANEL     Status: Abnormal   Collection Time   07/27/11 12:01 PM      Component Value Range Comment   Sodium 133 (*) 135 - 145 (mEq/L)    Potassium 3.4 (*) 3.5 - 5.1 (mEq/L)    Chloride 103  96 - 112 (mEq/L)    CO2 17 (*) 19 - 32 (mEq/L)    Glucose, Bld 226 (*) 70 - 99 (mg/dL)    BUN 19  6 - 23 (mg/dL)    Creatinine, Ser 0.87  0.50 - 1.10 (mg/dL) DELTA CHECK NOTED   Calcium 6.9 (*) 8.4 - 10.5 (mg/dL)    GFR calc non Af Amer 70 (*) >90 (mL/min)    GFR calc Af Amer 82 (*) >90 (mL/min)   GLUCOSE, CAPILLARY     Status: Abnormal   Collection Time   07/27/11 12:01 PM      Component Value Range Comment   Glucose-Capillary 215 (*) 70 - 99 (mg/dL)    No results found for this or any previous visit (from the past  240 hour(s)).  Dg Chest Port 1 View  07/26/2011  *RADIOLOGY REPORT*  Clinical Data: Shortness of breath, congestive heart failure  PORTABLE CHEST - 1 VIEW  Comparison: Portable chest x-ray of 07/25/2011  Findings: Minimal linear atelectasis or scarring in the left mid lung is stable.  No active infiltrate or effusion is seen.  There is no evidence of congestive heart failure.  Cardiomegaly is stable.  No bony abnormality is seen.  IMPRESSION: Stable cardiomegaly and linear atelectasis or scarring in the left  mid lung base.  No CHF.  Original Report Authenticated By: Joretta Bachelor, M.D.      Medications:  Scheduled:   . aspirin  324 mg Oral Pre-Cath  . aspirin EC  325 mg Oral Daily  . carvedilol  3.125 mg Oral BID WC  . doxycycline  100 mg Oral Q12H  . escitalopram  10 mg Oral Daily  . fentaNYL      . furosemide  40 mg Oral Daily  . heparin      . heparin      .  HYDROmorphone (DILAUDID) injection  0.5 mg Intravenous Once  . influenza  inactive virus vaccine  0.5 mL Intramuscular Tomorrow-1000  . insulin aspart  0-5 Units Subcutaneous QHS  . insulin aspart  0-9 Units Subcutaneous TID WC  . insulin glargine  20 Units Subcutaneous Daily  . insulin glargine  20 Units Subcutaneous QHS  . levothyroxine  100 mcg Oral Daily  . lidocaine      . living well with diabetes book   Does not apply Once  . midazolam      . nitroGLYCERIN      . ondansetron (ZOFRAN) IV  4 mg Intravenous Q6H  . pantoprazole  40 mg Oral Q1200  . potassium chloride  40 mEq Oral Once  . potassium chloride  40 mEq Oral Daily  . rosuvastatin  40 mg Oral q1800  . sodium chloride  250 mL Intravenous Once  . sodium chloride  3 mL Intravenous Q12H  . verapamil      . DISCONTD: carvedilol  6.25 mg Oral BID WC  . DISCONTD: diazepam  5 mg Oral On Call  . DISCONTD: furosemide  40 mg Intravenous BID  . DISCONTD: insulin glargine  15 Units Subcutaneous QHS  . DISCONTD: metoprolol tartrate  25 mg Oral BID  . DISCONTD: pantoprazole (PROTONIX) IV  40 mg Intravenous Daily  . DISCONTD: rosuvastatin  20 mg Oral q1800  . DISCONTD: sodium chloride  3 mL Intravenous Q12H    Impression:   1. CAD, multiple vessel - [place] cardiology following.  2.  Nausea and vomiting on admission - resolved 3.  Hyperlipidemia: Continue statin  4. Venous stasis ulcers: Elevation, antibiotics,  Check arterial Dopplers to rule out ischemia 5. Hypokalemia; -  due to diuretic. Replete 6.  DKA (diabetic ketoacidoses) - resolved 7.  Depression 8.   Systolic CHF, acute on chronic: Diuresis carefully with a close eye on blood pressure and creatinine 9.  DM type 1 causing renal disease - A1C 8.9 10. Protein caloric malnutrition        LOS: 2 days   Jearldean Gutt 07/27/2011, 2:39 PM

## 2011-07-27 NOTE — Progress Notes (Signed)
SUBJECTIVE:  No chest pain.  No SHOB.  Had some issues with low BP and low urine output last night.  Only 300 cc out yesterday.  No UOP documented but patient states she went to urinate at 4:30 AM.  Abdominal pain has improved.  OBJECTIVE:   Vitals:   Filed Vitals:   07/27/11 0200 07/27/11 0400 07/27/11 0500 07/27/11 0600  BP: 87/57  104/57 101/61  Pulse: 63  70 71  Temp:  97.2 F (36.2 C)    TempSrc:  Oral    Resp: 12     Height:      Weight:   96.435 kg (212 lb 9.6 oz)   SpO2: 99%  99% 99%   I&O's:   Intake/Output Summary (Last 24 hours) at 07/27/11 0744 Last data filed at 07/27/11 0600  Gross per 24 hour  Intake    710 ml  Output      0 ml  Net    710 ml   TELEMETRY: Reviewed telemetry pt in NSR:     PHYSICAL EXAM General: Well developed, well nourished, in no acute distress Head: Eyes PERRLA, No xanthomas.   Normal cephalic and atramatic  Lungs:   Clear bilaterally to auscultation anteriorly. Heart:   HRRR S1 S2 intermittent S3             Abdomen:  abdomen soft and non-tender  Extremities:  Legs wrapped due to ulcers; 2+ radial pulse. Positive reverse Allens test on right wrist. Neuro: Alert and oriented X 3. Psych:  Good affect, responds appropriately   LABS: Basic Metabolic Panel:  Basename 07/25/11 1723 07/25/11 1454  NA 134* 135  K 3.9 3.8  CL 98 99  CO2 22 21  GLUCOSE 128* 100*  BUN 22 21  CREATININE 1.34* 1.33*  CALCIUM 8.2* 8.1*  MG -- --  PHOS -- --   Liver Function Tests:  [facility] 07/26/11 0020  AST 93*  ALT 30  ALKPHOS 146*  BILITOT 0.8  PROT 5.3*  ALBUMIN 2.6*   No results found for this basename: LIPASE:2,AMYLASE:2 in the last 72 hours CBC:  Basename 07/26/11 0900 07/26/11 0020 07/25/11 0152  WBC 9.7 11.1* --  NEUTROABS -- -- 12.9*  HGB 15.0 14.6 --  HCT 42.9 41.4 --  MCV 84.1 83.1 --  PLT 224 226 --   Cardiac Enzymes:  Basename 07/26/11 0900 07/26/11 0019  CKTOTAL 311* 383*  CKMB 20.3* 30.9*  CKMBINDEX -- --    TROPONINI 4.60* 6.58*   BNP: No results found for this basename: POCBNP:3 in the last 72 hours D-Dimer: No results found for this basename: DDIMER:2 in the last 72 hours Hemoglobin A1C:  Basename 07/25/11 1723  HGBA1C 8.9*   Fasting Lipid Panel:  Basename 07/26/11 0310  CHOL 131  HDL 58  LDLCALC 35  TRIG 189*  CHOLHDL 2.3  LDLDIRECT --   Thyroid Function Tests:  Basename 07/26/11 0020  TSH 5.723*  T4TOTAL --  T3FREE --  THYROIDAB --   Anemia Panel: No results found for this basename: VITAMINB12,FOLATE,FERRITIN,TIBC,IRON,RETICCTPCT in the last 72 hours Coag Panel:   Lab Results  Component Value Date   INR 1.41 07/26/2011    RADIOLOGY: Dg Chest 1 View  07/25/2011  *RADIOLOGY REPORT*  Clinical Data: Nausea; hyperglycemia.  Leukocytosis.  CHEST - 1 VIEW  Comparison: None.  Findings: The lungs are well-aerated.  Mild left mid lung linear atelectasis noted.  There is no evidence of pleural effusion or pneumothorax.  The cardiomediastinal silhouette is  enlarged.  No acute osseous abnormalities are seen.  IMPRESSION: Mild left mid lung linear atelectasis; cardiomegaly noted.  Original Report Authenticated By: Santa Lighter, M.D.   US Abdomen Complete  07/25/2011  *RADIOLOGY REPORT*  Clinical Data:  Right upper quadrant pain, nausea/vomiting, leukocytosis  COMPLETE ABDOMINAL ULTRASOUND  Comparison:  None.  Findings:  Gallbladder:  No gallstones, gallbladder wall thickening, or pericholecystic fluid.  Negative sonographic Murphy's sign.  Common bile duct:  Measures 5 mm.  Liver:  No focal lesion identified.  Within normal limits in parenchymal echogenicity.  IVC:  Appears normal.  Pancreas:  Visualized portions who was limits.  Spleen:  Measures 6.9 cm.  Right Kidney:  Measures 12.7 cm.  13 x 9 x 9 mm interpolar cyst. No hydronephrosis.  Left Kidney:  Measures 11.2 cm.  No mass or hydronephrosis.  Abdominal aorta:  No aneurysm identified.  Additional comments:  Tiny right pleural  effusion.  IMPRESSION: Normal sonographic appearance of the gallbladder.  1.3 cm right renal cyst.  Tiny right pleural effusion.  Original Report Authenticated By: Julian Hy, M.D.   Dg Chest Port 1 View  07/26/2011  *RADIOLOGY REPORT*  Clinical Data: Shortness of breath, congestive heart failure  PORTABLE CHEST - 1 VIEW  Comparison: Portable chest x-ray of 07/25/2011  Findings: Minimal linear atelectasis or scarring in the left mid lung is stable.  No active infiltrate or effusion is seen.  There is no evidence of congestive heart failure.  Cardiomegaly is stable.  No bony abnormality is seen.  IMPRESSION: Stable cardiomegaly and linear atelectasis or scarring in the left mid lung base.  No CHF.  Original Report Authenticated By: Joretta Bachelor, M.D.      ASSESSMENT: Severe CAD and LV dysfunction.  Low BP and decreased urine output.  PLAN:  1) CAD: Seen by Dr. Nils Pyle and not thought to be candidate for CABG at this time.  He would like RHC and cardiac MRI to decide whether she would benefit from CABG.  2) Check labs this AM.  Of note, no hydronephrosis.  Will need to see Cr before finalizing cardiac MRI.  Plan for [place] on Monday.  Pre cath orders written.  3) Holding Coreg while BP is low.    4) Continue antibiotics for LE ulcers.  VARANASI,JAYADEEP S.  07/27/2011  7:44 AM

## 2011-07-27 NOTE — Progress Notes (Signed)
*  PRELIMINARY RESULTS*   Bilateral lower extremity venous dopplers completed.  There is no obvious evidence of deep or superficial vein thrombosis.      Sharion Dove Renee 07/27/2011, 4:37 PM

## 2011-07-28 LAB — GLUCOSE, CAPILLARY: Glucose-Capillary: 92 mg/dL (ref 70–99)

## 2011-07-28 LAB — BASIC METABOLIC PANEL
BUN: 20 mg/dL (ref 6–23)
Calcium: 8.4 mg/dL (ref 8.4–10.5)
GFR calc Af Amer: 62 mL/min — ABNORMAL LOW (ref >90)
GFR calc non Af Amer: 54 mL/min — ABNORMAL LOW (ref >90)
Potassium: 4.1 mEq/L (ref 3.5–5.1)
Sodium: 135 mEq/L (ref 135–145)

## 2011-07-28 LAB — CBC
MCH: 29.7 pg (ref 26.0–34.0)
MCHC: 35 g/dL (ref 30.0–36.0)
RDW: 19.1 % — ABNORMAL HIGH (ref 11.5–15.5)

## 2011-07-28 MED ORDER — DIAZEPAM 5 MG PO TABS: 5.0000 mg | ORAL_TABLET | ORAL | Status: DC

## 2011-07-28 MED ORDER — DIAZEPAM 5 MG PO TABS
5.0000 mg | ORAL_TABLET | ORAL | Status: AC
  Administered 2011-07-29: 5 mg via ORAL
  Filled 2011-07-28: qty 1

## 2011-07-28 MED ORDER — ASPIRIN 81 MG PO CHEW
324.0000 mg | CHEWABLE_TABLET | ORAL | Status: AC
  Administered 2011-07-29: 324 mg via ORAL
  Filled 2011-07-28: qty 4

## 2011-07-28 NOTE — Progress Notes (Signed)
TELEMETRY: Reviewed telemetry pt in NSR with occ PVC couplets.: Filed Vitals:   07/27/11 2018 07/28/11 0000 07/28/11 0400 07/28/11 0600  BP: 113/59     Pulse:      Temp: 97.7 F (36.5 C) 97.3 F (36.3 C) 97.9 F (36.6 C)   TempSrc: Oral Oral Oral   Resp:      Height:      Weight:    95.1 kg (209 lb 10.5 oz)  SpO2: 94% 99% 98%     Intake/Output Summary (Last 24 hours) at 07/28/11 0755 Last data filed at 07/28/11 0602  Gross per 24 hour  Intake   1024 ml  Output   1625 ml  Net   -601 ml    SUBJECTIVE Has a sensation that something is "hung up" in chest. No pain. Nausea recurred yesterday pm. No SOB.  LABS: Basic Metabolic Panel:  Basename 07/28/11 0024 07/27/11 1201  NA 135 133*  K 4.1 3.4*  CL 101 103  CO2 23 17*  GLUCOSE 107* 226*  BUN 20 19  CREATININE 1.09 0.87  CALCIUM 8.4 6.9*  MG -- --  PHOS -- --   Liver Function Tests:  [facility] 07/26/11 0020  AST 93*  ALT 30  ALKPHOS 146*  BILITOT 0.8  PROT 5.3*  ALBUMIN 2.6*   No results found for this basename: LIPASE:2,AMYLASE:2 in the last 72 hours CBC:  Basename 07/27/11 0740 07/26/11 0900  WBC 6.9 9.7  NEUTROABS -- --  HGB 14.7 15.0  HCT 43.9 42.9  MCV 85.4 84.1  PLT 200 224   Cardiac Enzymes:  Basename 07/26/11 0900 07/26/11 0019  CKTOTAL 311* 383*  CKMB 20.3* 30.9*  CKMBINDEX -- --  TROPONINI 4.60* 6.58*   BNP: No results found for this basename: POCBNP:3 in the last 72 hours D-Dimer: No results found for this basename: DDIMER:2 in the last 72 hours Hemoglobin A1C:  Basename 07/25/11 1723  HGBA1C 8.9*   Fasting Lipid Panel:  Basename 07/26/11 0310  CHOL 131  HDL 58  LDLCALC 35  TRIG 189*  CHOLHDL 2.3  LDLDIRECT --   Thyroid Function Tests:  Basename 07/26/11 0020  TSH 5.723*  T4TOTAL --  T3FREE --  THYROIDAB --   Anemia Panel: No results found for this basename: VITAMINB12,FOLATE,FERRITIN,TIBC,IRON,RETICCTPCT in the last 72 hours  Radiology/Studies:  Dg Chest 1  View  07/25/2011  *RADIOLOGY REPORT*  Clinical Data: Nausea; hyperglycemia.  Leukocytosis.  CHEST - 1 VIEW  Comparison: None.  Findings: The lungs are well-aerated.  Mild left mid lung linear atelectasis noted.  There is no evidence of pleural effusion or pneumothorax.  The cardiomediastinal silhouette is enlarged.  No acute osseous abnormalities are seen.  IMPRESSION: Mild left mid lung linear atelectasis; cardiomegaly noted.  Original Report Authenticated By: Santa Lighter, M.D.   US Abdomen Complete  07/25/2011  *RADIOLOGY REPORT*  Clinical Data:  Right upper quadrant pain, nausea/vomiting, leukocytosis  COMPLETE ABDOMINAL ULTRASOUND  Comparison:  None.  Findings:  Gallbladder:  No gallstones, gallbladder wall thickening, or pericholecystic fluid.  Negative sonographic Murphy's sign.  Common bile duct:  Measures 5 mm.  Liver:  No focal lesion identified.  Within normal limits in parenchymal echogenicity.  IVC:  Appears normal.  Pancreas:  Visualized portions who was limits.  Spleen:  Measures 6.9 cm.  Right Kidney:  Measures 12.7 cm.  13 x 9 x 9 mm interpolar cyst. No hydronephrosis.  Left Kidney:  Measures 11.2 cm.  No mass or hydronephrosis.  Abdominal aorta:  No aneurysm identified.  Additional comments:  Tiny right pleural effusion.  IMPRESSION: Normal sonographic appearance of the gallbladder.  1.3 cm right renal cyst.  Tiny right pleural effusion.  Original Report Authenticated By: Julian Hy, M.D.   Dg Chest Port 1 View  07/26/2011  *RADIOLOGY REPORT*  Clinical Data: Shortness of breath, congestive heart failure  PORTABLE CHEST - 1 VIEW  Comparison: Portable chest x-ray of 07/25/2011  Findings: Minimal linear atelectasis or scarring in the left mid lung is stable.  No active infiltrate or effusion is seen.  There is no evidence of congestive heart failure.  Cardiomegaly is stable.  No bony abnormality is seen.  IMPRESSION: Stable cardiomegaly and linear atelectasis or scarring in the left  mid lung base.  No CHF.  Original Report Authenticated By: Joretta Bachelor, M.D.   Lower extermity venous doppler negative for DVT.  PHYSICAL EXAM General: Obese female in no acute distress. Head: Normocephalic, atraumatic, sclera non-icteric. Neck: Negative for carotid bruits. JVD not elevated. Lungs: Clear bilaterally to auscultation without wheezes, rales, or rhonchi. Breathing is unlabored. Heart: RRR S1 S2 without murmurs, rubs, or gallops.  Abdomen: Soft, non-tender, non-distended with normoactive bowel sounds. No hepatomegaly. No rebound/guarding. No obvious abdominal masses. Extremities: 3+ edema. Ulcers are dressed.  Neuro: Alert and oriented X 3. Moves all extremities spontaneously. Psych:  Responds to questions appropriately with a normal affect.  ASSESSMENT AND PLAN:  Patient is without significant angina. On asa and low dose Coreg. I/O are fairly equal. Plan for Right heart cath tomorrow. Anticipate cardiac MRI for viability this week.    Principal Problem:  *CAD, multiple vessel Active Problems:  HTN (hypertension)  Hyperlipidemia  Venous stasis ulcers  Q waves suggestive of previous myocardial infarction  DKA (diabetic ketoacidoses)  Depression  Systolic CHF, acute on chronic  DM type 1 causing renal disease    Signed, Harlem Bula Martinique MD

## 2011-07-28 NOTE — Progress Notes (Signed)
Subjective: Still has some nausea.    Physical Exam: Blood pressure 116/82, pulse 76, temperature 97.4 F (36.3 C), temperature source Oral, resp. rate 18, height 5\' 7"  (1.702 m), weight 95.1 kg (209 lb 10.5 oz), SpO2 99.00%. Alert oriented x3. Distant heart sounds with 2/6 systolic murmur at apex, regular Chest clear to auscultation bilaterally Lower extremity with +3 bilaterally edema with shallow ulcers with purulent base and erythema   Investigations: Results for orders placed during the hospital encounter of 07/25/11 (from the past 48 hour(s))  GLUCOSE, CAPILLARY     Status: Abnormal   Collection Time   07/26/11  2:27 PM      Component Value Range Comment   Glucose-Capillary 399 (*) 70 - 99 (mg/dL)    Comment 1 Documented in Chart      Comment 2 Notify RN     GLUCOSE, CAPILLARY     Status: Abnormal   Collection Time   07/26/11  5:03 PM      Component Value Range Comment   Glucose-Capillary 173 (*) 70 - 99 (mg/dL)   GLUCOSE, CAPILLARY     Status: Abnormal   Collection Time   07/26/11 10:12 PM      Component Value Range Comment   Glucose-Capillary 144 (*) 70 - 99 (mg/dL)    Comment 1 Notify RN      Comment 2 Documented in Chart     CBC     Status: Abnormal   Collection Time   07/27/11  7:40 AM      Component Value Range Comment   WBC 6.9  4.0 - 10.5 (K/uL)    RBC 5.14 (*) 3.87 - 5.11 (MIL/uL)    Hemoglobin 14.7  12.0 - 15.0 (g/dL)    HCT 43.9  36.0 - 46.0 (%)    MCV 85.4  78.0 - 100.0 (fL)    MCH 28.6  26.0 - 34.0 (pg)    MCHC 33.5  30.0 - 36.0 (g/dL)    RDW 19.1 (*) 11.5 - 15.5 (%)    Platelets 200  150 - 400 (K/uL)   GLUCOSE, CAPILLARY     Status: Abnormal   Collection Time   07/27/11  8:04 AM      Component Value Range Comment   Glucose-Capillary 206 (*) 70 - 99 (mg/dL)    Comment 1 Notify RN     BASIC METABOLIC PANEL     Status: Abnormal   Collection Time   07/27/11 12:01 PM      Component Value Range Comment   Sodium 133 (*) 135 - 145 (mEq/L)    Potassium 3.4 (*) 3.5 - 5.1 (mEq/L)    Chloride 103  96 - 112 (mEq/L)    CO2 17 (*) 19 - 32 (mEq/L)    Glucose, Bld 226 (*) 70 - 99 (mg/dL)    BUN 19  6 - 23 (mg/dL)    Creatinine, Ser 0.87  0.50 - 1.10 (mg/dL) DELTA CHECK NOTED   Calcium 6.9 (*) 8.4 - 10.5 (mg/dL)    GFR calc non Af Amer 70 (*) >90 (mL/min)    GFR calc Af Amer 82 (*) >90 (mL/min)   GLUCOSE, CAPILLARY     Status: Abnormal   Collection Time   07/27/11 12:01 PM      Component Value Range Comment   Glucose-Capillary 215 (*) 70 - 99 (mg/dL)   GLUCOSE, CAPILLARY     Status: Abnormal   Collection Time   07/27/11  4:28 PM  Component Value Range Comment   Glucose-Capillary 141 (*) 70 - 99 (mg/dL)    Comment 1 Notify RN     GLUCOSE, CAPILLARY     Status: Abnormal   Collection Time   07/27/11 10:12 PM      Component Value Range Comment   Glucose-Capillary 110 (*) 70 - 99 (mg/dL)    Comment 1 Notify RN      Comment 2 Documented in Chart     BASIC METABOLIC PANEL     Status: Abnormal   Collection Time   07/28/11 12:24 AM      Component Value Range Comment   Sodium 135  135 - 145 (mEq/L)    Potassium 4.1  3.5 - 5.1 (mEq/L) DELTA CHECK NOTED   Chloride 101  96 - 112 (mEq/L)    CO2 23  19 - 32 (mEq/L)    Glucose, Bld 107 (*) 70 - 99 (mg/dL)    BUN 20  6 - 23 (mg/dL)    Creatinine, Ser 1.09  0.50 - 1.10 (mg/dL)    Calcium 8.4  8.4 - 10.5 (mg/dL)    GFR calc non Af Amer 54 (*) >90 (mL/min)    GFR calc Af Amer 62 (*) >90 (mL/min)   CBC     Status: Abnormal   Collection Time   07/28/11  6:20 AM      Component Value Range Comment   WBC 7.1  4.0 - 10.5 (K/uL)    RBC 5.12 (*) 3.87 - 5.11 (MIL/uL)    Hemoglobin 15.2 (*) 12.0 - 15.0 (g/dL)    HCT 43.4  36.0 - 46.0 (%)    MCV 84.8  78.0 - 100.0 (fL)    MCH 29.7  26.0 - 34.0 (pg)    MCHC 35.0  30.0 - 36.0 (g/dL)    RDW 19.1 (*) 11.5 - 15.5 (%)    Platelets 194  150 - 400 (K/uL)   GLUCOSE, CAPILLARY     Status: Normal   Collection Time   07/28/11  8:06 AM       Component Value Range Comment   Glucose-Capillary 89  70 - 99 (mg/dL)    Comment 1 Notify RN         Medications:  Scheduled:    . aspirin EC  325 mg Oral Daily  . carvedilol  3.125 mg Oral BID WC  . doxycycline  100 mg Oral Q12H  . escitalopram  10 mg Oral Daily  .  HYDROmorphone (DILAUDID) injection  0.5 mg Intravenous Once  . insulin aspart  0-5 Units Subcutaneous QHS  . insulin aspart  0-9 Units Subcutaneous TID WC  . insulin glargine  20 Units Subcutaneous Daily  . insulin glargine  20 Units Subcutaneous QHS  . levothyroxine  100 mcg Oral Daily  . ondansetron (ZOFRAN) IV  4 mg Intravenous Q6H  . pantoprazole  40 mg Oral Q1200  . potassium chloride  40 mEq Oral Once  . rosuvastatin  40 mg Oral q1800  . sodium chloride  3 mL Intravenous Q12H  . DISCONTD: furosemide  40 mg Oral Daily  . DISCONTD: insulin glargine  15 Units Subcutaneous QHS  . DISCONTD: pantoprazole (PROTONIX) IV  40 mg Intravenous Daily  . DISCONTD: potassium chloride  40 mEq Oral Daily    Impression:   1. CAD, multiple vessel - [place] cardiology following.  Patient is scheduled for right heart catheterization tomorrow to estimate the degree of pulmonary hypertension. She will also be scheduled for an  MRI of the heart to assess myocardial viability. Cardiothoracic surgery is following to assess patient's candidacy for CABG.  2.  Nausea and vomiting on admission - resolved 3.  Hyperlipidemia: Continue statin  4. Venous stasis ulcers: Elevation, antibiotics,  Check arterial Dopplers to rule out ischemia 5. Hypokalemia; -  due to diuretic. Resolved 6.  DKA (diabetic ketoacidoses) - resolved 7.  Depression 8.  Systolic CHF, acute on chronic: Diuresis carefully with a close eye on blood pressure and creatinine 9.  DM type 1 causing renal disease - A1C 8.9 10. Protein caloric malnutrition        LOS: 3 days   Bridget Martinez 07/28/2011, 11:36 AM

## 2011-07-29 ENCOUNTER — Encounter (HOSPITAL_COMMUNITY)
Admission: EM | Disposition: A | Discharge status: Home / Self Care | Payer: Self-pay | Source: Home / Self Care | Attending: Internal Medicine

## 2011-07-29 DIAGNOSIS — E162 Hypoglycemia, unspecified: Secondary | ICD-10-CM | POA: Diagnosis not present

## 2011-07-29 DIAGNOSIS — I2589 Other forms of chronic ischemic heart disease: Secondary | ICD-10-CM

## 2011-07-29 DIAGNOSIS — R570 Cardiogenic shock: Secondary | ICD-10-CM

## 2011-07-29 HISTORY — PX: RIGHT HEART CATHETERIZATION: SHX5447

## 2011-07-29 LAB — POCT I-STAT 3, VENOUS BLOOD GAS (G3P V)
Acid-Base Excess: 2 mmol/L (ref 0.0–2.0)
Acid-Base Excess: 2 mmol/L (ref 0.0–2.0)
Bicarbonate: 27.5 mEq/L — ABNORMAL HIGH (ref 20.0–24.0)
O2 Saturation: 37 %
O2 Saturation: 44 %
TCO2: 29 mmol/L (ref 0–100)

## 2011-07-29 LAB — CBC
HCT: 43.7 % (ref 36.0–46.0)
MCV: 84.5 fL (ref 78.0–100.0)
Platelets: 214 10*3/uL (ref 150–400)
RBC: 5.17 MIL/uL — ABNORMAL HIGH (ref 3.87–5.11)
RDW: 19.1 % — ABNORMAL HIGH (ref 11.5–15.5)
WBC: 7.2 10*3/uL (ref 4.0–10.5)

## 2011-07-29 LAB — GLUCOSE, CAPILLARY
Glucose-Capillary: 103 mg/dL — ABNORMAL HIGH (ref 70–99)
Glucose-Capillary: 40 mg/dL — CL (ref 70–99)
Glucose-Capillary: 61 mg/dL — ABNORMAL LOW (ref 70–99)

## 2011-07-29 LAB — BASIC METABOLIC PANEL
BUN: 15 mg/dL (ref 6–23)
CO2: 25 mEq/L (ref 19–32)
Chloride: 102 mEq/L (ref 96–112)
Creatinine, Ser: 0.83 mg/dL (ref 0.50–1.10)
GFR calc Af Amer: 86 mL/min — ABNORMAL LOW (ref >90)
Potassium: 3.6 mEq/L (ref 3.5–5.1)

## 2011-07-29 SURGERY — RIGHT HEART CATH
Anesthesia: Moderate Sedation

## 2011-07-29 SURGERY — CORONARY ARTERY BYPASS GRAFTING (CABG)
Anesthesia: General | Site: Chest | Wound class: Clean

## 2011-07-29 MED ORDER — SPIRONOLACTONE 12.5 MG HALF TABLET
12.5000 mg | ORAL_TABLET | Freq: Every day | ORAL | Status: DC
  Administered 2011-07-29 – 2011-07-30 (×2): 12.5 mg via ORAL
  Filled 2011-07-29 (×3): qty 1

## 2011-07-29 MED ORDER — HEPARIN (PORCINE) IN NACL 2-0.9 UNIT/ML-% IJ SOLN
INTRAMUSCULAR | Status: AC
  Filled 2011-07-29: qty 1000

## 2011-07-29 MED ORDER — SODIUM CHLORIDE 0.9 % IV SOLN
INTRAVENOUS | Status: DC
  Administered 2011-07-29: 08:00:00 via INTRAVENOUS

## 2011-07-29 MED ORDER — LIDOCAINE HCL (PF) 1 % IJ SOLN
INTRAMUSCULAR | Status: AC
  Filled 2011-07-29: qty 30

## 2011-07-29 MED ORDER — DEXTROSE-NACL 5-0.9 % IV SOLN
INTRAVENOUS | Status: DC
  Administered 2011-07-29: 10 mL/h via INTRAVENOUS

## 2011-07-29 MED ORDER — FUROSEMIDE 10 MG/ML IJ SOLN
40.0000 mg | Freq: Two times a day (BID) | INTRAMUSCULAR | Status: DC
  Administered 2011-07-29 – 2011-08-01 (×6): 40 mg via INTRAVENOUS
  Filled 2011-07-29 (×8): qty 4

## 2011-07-29 MED ORDER — DEXTROSE-NACL 5-0.9 % IV SOLN: INTRAVENOUS | Status: DC

## 2011-07-29 MED ORDER — DEXTROSE 50 % IV SOLN
INTRAVENOUS | Status: AC
  Administered 2011-07-29: 25 mL via INTRAVENOUS
  Filled 2011-07-29: qty 50

## 2011-07-29 MED ORDER — MILRINONE IN DEXTROSE 200-5 MCG/ML-% IV SOLN
0.2500 ug/kg/min | INTRAVENOUS | Status: DC
  Administered 2011-07-30 – 2011-08-02 (×6): 0.25 ug/kg/min via INTRAVENOUS
  Filled 2011-07-29 (×6): qty 100

## 2011-07-29 MED ORDER — ENALAPRIL MALEATE 2.5 MG PO TABS
2.5000 mg | ORAL_TABLET | Freq: Two times a day (BID) | ORAL | Status: DC
  Filled 2011-07-29 (×2): qty 1

## 2011-07-29 SURGICAL SUPPLY — 129 items
ADAPTER CARDIO PERF ANTE/RETRO (ADAPTER) ×3 IMPLANT
ADPR PRFSN 84XANTGRD RTRGD (ADAPTER) ×1
APPLIER CLIP 9.375 MED OPEN (MISCELLANEOUS)
APPLIER CLIP 9.375 SM OPEN (CLIP)
APR CLP MED 9.3 20 MLT OPN (MISCELLANEOUS)
APR CLP SM 9.3 20 MLT OPN (CLIP)
ATTRACTOMAT 16X20 MAGNETIC DRP (DRAPES) ×3 IMPLANT
BAG DECANTER FOR FLEXI CONT (MISCELLANEOUS) ×3 IMPLANT
BANDAGE ELASTIC 4 VELCRO ST LF (GAUZE/BANDAGES/DRESSINGS) ×3 IMPLANT
BANDAGE ELASTIC 6 VELCRO ST LF (GAUZE/BANDAGES/DRESSINGS) ×3 IMPLANT
BANDAGE GAUZE ELAST BULKY 4 IN (GAUZE/BANDAGES/DRESSINGS) ×3 IMPLANT
BASKET HEART  (ORDER IN 25'S) (MISCELLANEOUS) ×1
BASKET HEART (ORDER IN 25'S) (MISCELLANEOUS) ×1
BASKET HEART (ORDER IN 25S) (MISCELLANEOUS) ×1 IMPLANT
BLADE SAW STERNAL (BLADE) ×3 IMPLANT
BLADE SURG 12 STRL SS (BLADE) ×3 IMPLANT
BLADE SURG ROTATE 9660 (MISCELLANEOUS) ×3 IMPLANT
CANISTER SUCTION 2500CC (MISCELLANEOUS) ×3 IMPLANT
CANNULA AORTIC ROOT 20012 (MISCELLANEOUS) IMPLANT
CANNULA GUNDRY RCSP 15FR (MISCELLANEOUS) ×3 IMPLANT
CATH ROBINSON RED A/P 18FR (CATHETERS) ×9 IMPLANT
CATH THORACIC 28FR (CATHETERS) ×3 IMPLANT
CATH THORACIC 28FR RT ANG (CATHETERS) ×3 IMPLANT
CATH THORACIC 36FR (CATHETERS) ×3 IMPLANT
CATH THORACIC 36FR RT ANG (CATHETERS) ×6 IMPLANT
CLIP APPLIE 9.375 MED OPEN (MISCELLANEOUS) IMPLANT
CLIP APPLIE 9.375 SM OPEN (CLIP) IMPLANT
CLIP FOGARTY SPRING 6M (CLIP) IMPLANT
CLIP TI MEDIUM 24 (CLIP) IMPLANT
CLIP TI WIDE RED SMALL 24 (CLIP) IMPLANT
CLOTH BEACON ORANGE TIMEOUT ST (SAFETY) ×3 IMPLANT
CONN Y 3/8X3/8X3/8  BEN (MISCELLANEOUS)
CONN Y 3/8X3/8X3/8 BEN (MISCELLANEOUS) IMPLANT
COVER SURGICAL LIGHT HANDLE (MISCELLANEOUS) ×6 IMPLANT
CRADLE DONUT ADULT HEAD (MISCELLANEOUS) ×3 IMPLANT
DRAPE CARDIOVASCULAR INCISE (DRAPES) ×3
DRAPE SLUSH MACHINE 52X66 (DRAPES) ×3 IMPLANT
DRAPE SLUSH/WARMER DISC (DRAPES) IMPLANT
DRAPE SRG 135X102X78XABS (DRAPES) ×1 IMPLANT
DRSG COVADERM 4X14 (GAUZE/BANDAGES/DRESSINGS) ×3 IMPLANT
ELECT BLADE 6.5 EXT (BLADE) ×3 IMPLANT
ELECT CAUTERY BLADE 6.4 (BLADE) ×3 IMPLANT
ELECT PAD GROUND ADT 9 (MISCELLANEOUS) IMPLANT
ELECT REM PT RETURN 9FT ADLT (ELECTROSURGICAL) ×6
ELECTRODE REM PT RTRN 9FT ADLT (ELECTROSURGICAL) ×2 IMPLANT
GLOVE BIO SURGEON STRL SZ 6 (GLOVE) IMPLANT
GLOVE BIO SURGEON STRL SZ 6.5 (GLOVE) IMPLANT
GLOVE BIO SURGEON STRL SZ7 (GLOVE) IMPLANT
GLOVE BIO SURGEON STRL SZ7.5 (GLOVE) ×3 IMPLANT
GLOVE BIO SURGEONS STRL SZ 6.5 (GLOVE)
GLOVE BIOGEL PI IND STRL 6 (GLOVE) ×1 IMPLANT
GLOVE BIOGEL PI IND STRL 6.5 (GLOVE) ×1 IMPLANT
GLOVE BIOGEL PI IND STRL 7.0 (GLOVE) ×1 IMPLANT
GLOVE BIOGEL PI INDICATOR 6 (GLOVE) ×2
GLOVE BIOGEL PI INDICATOR 6.5 (GLOVE) ×2
GLOVE BIOGEL PI INDICATOR 7.0 (GLOVE) ×2
GLOVE EUDERMIC 7 POWDERFREE (GLOVE) ×3 IMPLANT
GLOVE ORTHO TXT STRL SZ7.5 (GLOVE) ×3 IMPLANT
GOWN STRL NON-REIN LRG LVL3 (GOWN DISPOSABLE) ×12 IMPLANT
HEMOSTAT POWDER SURGIFOAM 1G (HEMOSTASIS) ×9 IMPLANT
HEMOSTAT SURGICEL 2X14 (HEMOSTASIS) ×3 IMPLANT
INSERT FOGARTY 61MM (MISCELLANEOUS) ×3 IMPLANT
INSERT FOGARTY XLG (MISCELLANEOUS) IMPLANT
KIT BASIN OR (CUSTOM PROCEDURE TRAY) ×3 IMPLANT
KIT PAIN CUSTOM (MISCELLANEOUS) IMPLANT
KIT ROOM TURNOVER OR (KITS) ×3 IMPLANT
KIT SUCTION CATH 14FR (SUCTIONS) ×3 IMPLANT
KIT VASOVIEW W/TROCAR VH 2000 (KITS) ×3 IMPLANT
LINE VENT (MISCELLANEOUS) IMPLANT
MARKER GRAFT CORONARY BYPASS (MISCELLANEOUS) ×9 IMPLANT
NS IRRIG 1000ML POUR BTL (IV SOLUTION) ×15 IMPLANT
PACK OPEN HEART (CUSTOM PROCEDURE TRAY) ×3 IMPLANT
PAD ARMBOARD 7.5X6 YLW CONV (MISCELLANEOUS) ×3 IMPLANT
PENCIL BUTTON HOLSTER BLD 10FT (ELECTRODE) ×3 IMPLANT
PUNCH AORTIC ROTATE 4.0MM (MISCELLANEOUS) IMPLANT
PUNCH AORTIC ROTATE 4.5MM 8IN (MISCELLANEOUS) IMPLANT
PUNCH AORTIC ROTATE 5MM 8IN (MISCELLANEOUS) IMPLANT
SET CARDIO DLP MULTI-PER 4-LEG (TRAUMA) IMPLANT
SET CARDIOPLEGIA MPS 5001102 (MISCELLANEOUS) IMPLANT
SET MULTI PERFUSION 14000 (TRAUMA)
SOLUTION ANTI FOG 6CC (MISCELLANEOUS) ×3 IMPLANT
SPONGE GAUZE 4X4 12PLY (GAUZE/BANDAGES/DRESSINGS) ×6 IMPLANT
SPONGE INTESTINAL PEANUT (DISPOSABLE) ×3 IMPLANT
SPONGE LAP 18X18 X RAY DECT (DISPOSABLE) ×3 IMPLANT
SPONGE LAP 4X18 X RAY DECT (DISPOSABLE) ×3 IMPLANT
SUT BONE WAX W31G (SUTURE) ×3 IMPLANT
SUT MNCRL AB 4-0 PS2 18 (SUTURE) IMPLANT
SUT PROLENE 3 0 SH 1 (SUTURE) IMPLANT
SUT PROLENE 3 0 SH DA (SUTURE) IMPLANT
SUT PROLENE 3 0 SH1 36 (SUTURE) ×3 IMPLANT
SUT PROLENE 4 0 RB 1 (SUTURE)
SUT PROLENE 4 0 SH DA (SUTURE) IMPLANT
SUT PROLENE 4-0 RB1 .5 CRCL 36 (SUTURE) IMPLANT
SUT PROLENE 5 0 C 1 36 (SUTURE) ×3 IMPLANT
SUT PROLENE 6 0 C 1 30 (SUTURE) IMPLANT
SUT PROLENE 6 0 CC (SUTURE) ×3 IMPLANT
SUT PROLENE 7 0 BV 1 (SUTURE) ×3 IMPLANT
SUT PROLENE 7 0 BV1 MDA (SUTURE) ×3 IMPLANT
SUT PROLENE 7 0 DA (SUTURE) IMPLANT
SUT PROLENE 7.0 RB 3 (SUTURE) ×3 IMPLANT
SUT PROLENE 8 0 BV175 6 (SUTURE) IMPLANT
SUT PROLENE BLUE 7 0 (SUTURE) IMPLANT
SUT PROLENE POLY MONO (SUTURE) ×3 IMPLANT
SUT SILK  1 MH (SUTURE)
SUT SILK 1 MH (SUTURE) IMPLANT
SUT SILK 1 TIES 10X30 (SUTURE) IMPLANT
SUT SILK 2 0 SH CR/8 (SUTURE) IMPLANT
SUT SILK 3 0 SH CR/8 (SUTURE) ×3 IMPLANT
SUT STEEL 6MS V (SUTURE) IMPLANT
SUT STEEL STERNAL CCS#1 18IN (SUTURE) IMPLANT
SUT STEEL SZ 6 DBL 3X14 BALL (SUTURE) IMPLANT
SUT VIC AB 1 CTX 18 (SUTURE) IMPLANT
SUT VIC AB 1 CTX 36 (SUTURE)
SUT VIC AB 1 CTX36XBRD ANBCTR (SUTURE) IMPLANT
SUT VIC AB 2-0 CT1 27 (SUTURE)
SUT VIC AB 2-0 CT1 TAPERPNT 27 (SUTURE) IMPLANT
SUT VIC AB 2-0 CTX 27 (SUTURE) IMPLANT
SUT VIC AB 3-0 SH 27 (SUTURE)
SUT VIC AB 3-0 SH 27X BRD (SUTURE) IMPLANT
SUT VIC AB 3-0 X1 27 (SUTURE) IMPLANT
SUT VICRYL 4-0 PS2 18IN ABS (SUTURE) IMPLANT
SUTURE E-PAK OPEN HEART (SUTURE) ×3 IMPLANT
SYSTEM SAHARA CHEST DRAIN ATS (WOUND CARE) ×3 IMPLANT
TOWEL OR 17X24 6PK STRL BLUE (TOWEL DISPOSABLE) ×3 IMPLANT
TOWEL OR 17X26 10 PK STRL BLUE (TOWEL DISPOSABLE) ×3 IMPLANT
TRAY FOLEY IC TEMP SENS 16FR (CATHETERS) ×3 IMPLANT
TUBING INSUFFLATION 10FT LAP (TUBING) ×3 IMPLANT
UNDERPAD 30X30 INCONTINENT (UNDERPADS AND DIAPERS) ×3 IMPLANT
WATER STERILE IRR 1000ML POUR (IV SOLUTION) ×6 IMPLANT

## 2011-07-29 NOTE — H&P (Signed)
Chart reviewed.  Labs and studies reviewed.  For right heart cath.  Consent signed.  [name] [facility] 07/29/2011 12:54 PM

## 2011-07-29 NOTE — Plan of Care (Signed)
Problem: Phase I Progression Outcomes Goal: Initial discharge plan identified Outcome: Completed/Met Date Met:  07/29/11 Plan to return home

## 2011-07-29 NOTE — Progress Notes (Signed)
Subjective: Patient with no complaints. No CP, No SOB. Patient with hypoglycemic episode this morning. Patient just back from [place].   Physical Exam: Blood pressure 100/73, pulse 73, temperature 97.7 F (36.5 C), temperature source Oral, resp. rate 17, height 5\' 7"  (1.702 m), weight 94.4 kg (208 lb 1.8 oz), SpO2 97.00%. Alert oriented x3. Distant heart sounds with 2/6 systolic murmur at apex, regular Chest clear to auscultation bilaterally Lower extremity with +2 bilaterally edema with shallow ulcers with purulent base and erythema   Investigations: Results for orders placed during the hospital encounter of 07/25/11 (from the past 48 hour(s))  GLUCOSE, CAPILLARY     Status: Abnormal   Collection Time   07/27/11  4:28 PM      Component Value Range Comment   Glucose-Capillary 141 (*) 70 - 99 (mg/dL)    Comment 1 Notify RN     GLUCOSE, CAPILLARY     Status: Abnormal   Collection Time   07/27/11 10:12 PM      Component Value Range Comment   Glucose-Capillary 110 (*) 70 - 99 (mg/dL)    Comment 1 Notify RN      Comment 2 Documented in Chart     BASIC METABOLIC PANEL     Status: Abnormal   Collection Time   07/28/11 12:24 AM      Component Value Range Comment   Sodium 135  135 - 145 (mEq/L)    Potassium 4.1  3.5 - 5.1 (mEq/L) DELTA CHECK NOTED   Chloride 101  96 - 112 (mEq/L)    CO2 23  19 - 32 (mEq/L)    Glucose, Bld 107 (*) 70 - 99 (mg/dL)    BUN 20  6 - 23 (mg/dL)    Creatinine, Ser 1.09  0.50 - 1.10 (mg/dL)    Calcium 8.4  8.4 - 10.5 (mg/dL)    GFR calc non Af Amer 54 (*) >90 (mL/min)    GFR calc Af Amer 62 (*) >90 (mL/min)   CBC     Status: Abnormal   Collection Time   07/28/11  6:20 AM      Component Value Range Comment   WBC 7.1  4.0 - 10.5 (K/uL)    RBC 5.12 (*) 3.87 - 5.11 (MIL/uL)    Hemoglobin 15.2 (*) 12.0 - 15.0 (g/dL)    HCT 43.4  36.0 - 46.0 (%)    MCV 84.8  78.0 - 100.0 (fL)    MCH 29.7  26.0 - 34.0 (pg)    MCHC 35.0  30.0 - 36.0 (g/dL)    RDW 19.1 (*) 11.5  - 15.5 (%)    Platelets 194  150 - 400 (K/uL)   GLUCOSE, CAPILLARY     Status: Normal   Collection Time   07/28/11  8:06 AM      Component Value Range Comment   Glucose-Capillary 89  70 - 99 (mg/dL)    Comment 1 Notify RN     GLUCOSE, CAPILLARY     Status: Abnormal   Collection Time   07/28/11 12:23 PM      Component Value Range Comment   Glucose-Capillary 140 (*) 70 - 99 (mg/dL)   GLUCOSE, CAPILLARY     Status: Abnormal   Collection Time   07/28/11  4:11 PM      Component Value Range Comment   Glucose-Capillary 102 (*) 70 - 99 (mg/dL)    Comment 1 Notify RN     GLUCOSE, CAPILLARY     Status: Normal  Collection Time   07/28/11  9:37 PM      Component Value Range Comment   Glucose-Capillary 92  70 - 99 (mg/dL)   CBC     Status: Abnormal   Collection Time   07/29/11  7:25 AM      Component Value Range Comment   WBC 7.2  4.0 - 10.5 (K/uL)    RBC 5.17 (*) 3.87 - 5.11 (MIL/uL)    Hemoglobin 15.1 (*) 12.0 - 15.0 (g/dL)    HCT 43.7  36.0 - 46.0 (%)    MCV 84.5  78.0 - 100.0 (fL)    MCH 29.2  26.0 - 34.0 (pg)    MCHC 34.6  30.0 - 36.0 (g/dL)    RDW 19.1 (*) 11.5 - 15.5 (%)    Platelets 214  150 - 400 (K/uL)   BASIC METABOLIC PANEL     Status: Abnormal   Collection Time   07/29/11  7:25 AM      Component Value Range Comment   Sodium 139  135 - 145 (mEq/L)    Potassium 3.6  3.5 - 5.1 (mEq/L)    Chloride 102  96 - 112 (mEq/L)    CO2 25  19 - 32 (mEq/L)    Glucose, Bld 40 (*) 70 - 99 (mg/dL)    BUN 15  6 - 23 (mg/dL)    Creatinine, Ser 0.83  0.50 - 1.10 (mg/dL)    Calcium 8.5  8.4 - 10.5 (mg/dL)    GFR calc non Af Amer 75 (*) >90 (mL/min)    GFR calc Af Amer 86 (*) >90 (mL/min)   GLUCOSE, CAPILLARY     Status: Abnormal   Collection Time   07/29/11  8:33 AM      Component Value Range Comment   Glucose-Capillary 43 (*) 70 - 99 (mg/dL)   GLUCOSE, CAPILLARY     Status: Abnormal   Collection Time   07/29/11  8:34 AM      Component Value Range Comment   Glucose-Capillary 40  (*) 70 - 99 (mg/dL)   GLUCOSE, CAPILLARY     Status: Abnormal   Collection Time   07/29/11  9:13 AM      Component Value Range Comment   Glucose-Capillary 103 (*) 70 - 99 (mg/dL)   GLUCOSE, CAPILLARY     Status: Abnormal   Collection Time   07/29/11 11:35 AM      Component Value Range Comment   Glucose-Capillary 49 (*) 70 - 99 (mg/dL)   GLUCOSE, CAPILLARY     Status: Abnormal   Collection Time   07/29/11  1:38 PM      Component Value Range Comment   Glucose-Capillary 61 (*) 70 - 99 (mg/dL)       Medications:  Scheduled:    . aspirin  324 mg Oral Pre-Cath  . aspirin EC  325 mg Oral Daily  . carvedilol  3.125 mg Oral BID WC  . diazepam  5 mg Oral On Call  . doxycycline  100 mg Oral Q12H  . escitalopram  10 mg Oral Daily  . heparin      .  HYDROmorphone (DILAUDID) injection  0.5 mg Intravenous Once  . insulin aspart  0-5 Units Subcutaneous QHS  . insulin aspart  0-9 Units Subcutaneous TID WC  . insulin glargine  20 Units Subcutaneous QHS  . levothyroxine  100 mcg Oral Daily  . lidocaine      . ondansetron (ZOFRAN) IV  4 mg Intravenous Q6H  .  pantoprazole  40 mg Oral Q1200  . rosuvastatin  40 mg Oral q1800  . DISCONTD: diazepam  5 mg Oral On Call  . DISCONTD: insulin glargine  20 Units Subcutaneous Daily  . DISCONTD: sodium chloride  3 mL Intravenous Q12H    Impression:   1. CAD, multiple vessel/ Ischemic Cardiomyopoathy - [place] cardiology following.  Patient is s/p right heart catheterization, with elevated right heart pressures and severly reduced cardiac output per [facility]. MRI of the heart to assess myocardial viability pending . Cardiothoracic surgery is following to assess patient's candidacy for CABG.  2. Hypoglycemia - Secondary to NPO and insulin. Improved on D5. Once tolerating oral intake will d/c d5. 3.  Nausea and vomiting on admission - resolved 4.  Hyperlipidemia: Continue statin  5.. Venous stasis ulcers: Elevation, antibiotics. 6. Hypokalemia; -  due to  diuretic. Resolved 7.  DKA (diabetic ketoacidoses) - resolved 8.  Depression 9.  Systolic CHF, acute on chronic: Diuresis carefully with a close eye on blood pressure and creatinine 10.  DM type 1 causing renal disease - A1C 8.9 11. Protein caloric malnutrition        LOS: 4 days   THOMPSON,DANIEL 07/29/2011, 2:32 PM

## 2011-07-29 NOTE — Progress Notes (Signed)
CBG: 49 Treatment: D50 IV 25 mL  Symptoms: None  Follow-up CBG: Time:1200 CBG Result:83  Possible Reasons for Event: Other: npo  Comments/MD notified no    Nelida Meuse

## 2011-07-29 NOTE — Progress Notes (Signed)
Day of Surgery Procedure(s) (LRB): RIGHT HEART CATH (N/A) Subjective:no anginaNSR  Objective: Vital signs in last 24 hours: Temp:  [97.4 F (36.3 C)-98.3 F (36.8 C)] 97.7 F (36.5 C) (11/19 1654) Pulse Rate:  [73-76] 76  (11/19 1632) Cardiac Rhythm:  [-] Normal sinus rhythm (11/19 1632) Resp:  [17-18] 17  (11/19 0751) BP: (100-126)/(69-85) 111/81 mmHg (11/19 1632) SpO2:  [97 %-99 %] 99 % (11/19 1654) Weight:  [208 lb 1.8 oz (94.4 kg)] 208 lb 1.8 oz (94.4 kg) (11/18 1949)  Hemodynamic parameters for last 24 hours:  NSR   110/80 Intake/Output from previous day: 11/18 0701 - 11/19 0700 In: 900 [P.O.:900] Out: 1600 [Urine:1600] Intake/Output this shift: Total I/O In: 382 [P.O.:240; I.V.:142] Out: 500 [Urine:500]  Edematous with venous stasis ulcers  Lab Results:  [facility] 07/29/11 0725 07/28/11 0620  WBC 7.2 7.1  HGB 15.1* 15.2*  HCT 43.7 43.4  PLT 214 194   BMET:  Basename 07/29/11 0725 07/28/11 0024  NA 139 135  K 3.6 4.1  CL 102 101  CO2 25 23  GLUCOSE 40* 107*  BUN 15 20  CREATININE 0.83 1.09  CALCIUM 8.5 8.4    PT/INR: No results found for this basename: LABPROT,INR in the last 72 hours ABG    Component Value Date/Time   HCO3 27.6* 07/29/2011 1326   TCO2 29 07/29/2011 1326   ACIDBASEDEF 14.0* 07/25/2011 0303   O2SAT 37.0 07/29/2011 1326   CBG (last 3)   Basename 07/29/11 1630 07/29/11 1338 07/29/11 1210  GLUCAP 261* 61* 83    Assessment/Plan: S/P Procedure(s) (LRB): RIGHT HEART CATH (N/A)  Right heart data not adequate for CABG ( CI 1.2)   MRI viability study next then consider if circ could be treated w/ PCI. Consider consult to Heart Failure Svc Dr Haroldine Laws   LOS: 4 days    VAN TRIGT III,Marguis Mathieson 07/29/2011

## 2011-07-29 NOTE — Procedures (Signed)
  Cardiac Catheterization Procedure Note  Name: Bridget Martinez MRN: [id number] DOB: 04/16/1951  Procedure: Right Heart Cath  Indication:  CAD, pre operative evaluation  Procedural Details: The right groin was prepped, draped, and anesthetized with 1% lidocaine. Using the modified Seldinger technique a  7 French sheath was placed in the right femoral vein. A Swan-Ganz catheter was used for the right heart catheterization. Standard protocol was followed for recording of right heart pressures and sampling of oxygen saturations. Fick cardiac output was calculated. There were no immediate procedural complications. The patient was transferred to the post catheterization recovery area for further monitoring.  Procedural Findings: Hemodynamics:               RA Mean 21    RV 44/13    PA 47/27   35    PCWP Mean 25    PVR  319 dynes-sec-cm-5         Oxygen saturations:    PA 44%    AO 97% (RA)   Cardiac Output (Fick) 2.51                               Cardiac Index (Fick) 1.22    Final Conclusions:  Ischemic cardiomyopathy with elevated right heart pressures as listed and severely reduced CO/CI Recommendations: Further review per cardiothoracic surgery.   Minus Breeding 07/29/2011, 1:18 PM

## 2011-07-29 NOTE — Consult Note (Addendum)
HEART FAILURE TEAM CONSULT NOTE  Referring Physician: Prescott Gum  Reason for Consultation: Cardiogenic shock   HPI:  Bridget Martinez is a 60 y.o. female without a history of morbid obesity, HL, DM2. Admitted last week with severe hyperglycemia, nausea/vomitting  and found to have NSTEMI. Trop ~6  Cath which I reviewed tonight personally showed severe 3-V CAD with graftable vessels but markedly elevated filling pressures.  Echo with EF 25-30% mod-severe TR and moderate-severe MR. TCTS consult placed and RHC requested.  RHC today with shock physiology: RA Mean 21  RV 44/13  PA 47/27 35  PCWP Mean 25  PVR 319 dynes-sec-cm-5  (4.0 Woods) Oxygen saturations:  PA 44%  AO 97% (RA)  Cardiac Output (Fick) 2.51 Cardiac Index (Fick) 1.22   Heart failure team asked to consult to assist with management and to help determine if patient is operative candidate.   Patient currently fatigued. Denies CP, orthopnea or PND. Gets dyspneic with minimal activity. No further nauea or vomiting. Has LE ulcers/cellulitis. Has h/o severe depression but under good control recently.   Review of Systems:     Cardiac Review of Systems: {Y] = yes [ ]  = no  Chest Pain [    ]  Resting SOB [   ] Exertional SOB  Blue.Reese  ]  Orthopnea [  ]   Pedal Edema [ y  ]    Palpitations [  ] Syncope  [  ]   Presyncope [   ]  General Review of Systems: [Y] = yes [  ]=no Constitional: recent weight change [  ]; anorexia [  ]; fatigue [  ]; nausea [  ]; night sweats [  ]; fever [  ]; or chills [  ];                                                                                                                                      Eye : blurred vision [  ]; diplopia [   ]; vision changes [  ];  Amaurosis fugax[  ]; Resp: cough [  ];  wheezing[  ];  hemoptysis[  ]; shortness of breath[y  ]; paroxysmal nocturnal dyspnea[  ]; dyspnea on exertion[y  ]; or orthopnea[  ];  GI:  gallstones[  ], vomiting[  ];  dysphagia[  ]; melena[  ];   hematochezia [  ]; heartburn[  ];    GU: kidney stones [  ]; hematuria[  ];   dysuria [  ];  nocturia[  ];  history of     obstruction [  ];                 Skin: rash, swelling[  ];, hair loss[  ];  peripheral edema[y  ];  or itching[  ]; Musculosketetal: myalgias[  ];  joint swelling[  ];  joint erythema[  ];  joint pain[ y ];  back pain[  ];  Heme/Lymph: bruising[  ];  bleeding[  ];  anemia[  ];  Neuro: TIA[  ];  headaches[ y ];  stroke[  ];  vertigo[  ];  seizures[  ];   paresthesias[  ];  difficulty walking[  ];  Psych:depression[ y ]; [place]  ];  Endocrine: diabetes[y  ];  thyroid dysfunction[  ];  Immunizations: Flu [  ]; Pneumococcal[  ];  Other:  Past Medical History  Diagnosis Date  . Diabetes mellitus     on insulin, with h/o DKA   . HTN (hypertension)   . Hyperlipidemia   . Venous stasis ulcers   . Q waves suggestive of previous myocardial infarction     Pt denies h/o AMI, cath, PCI  . Angina   . Tuberculosis   . Hyperthyroidism      CURRENT MEDS:    . aspirin  324 mg Oral Pre-Cath  . aspirin EC  325 mg Oral Daily  . carvedilol  3.125 mg Oral BID WC  . diazepam  5 mg Oral On Call  . doxycycline  100 mg Oral Q12H  . escitalopram  10 mg Oral Daily  . heparin      .  HYDROmorphone (DILAUDID) injection  0.5 mg Intravenous Once  . insulin aspart  0-5 Units Subcutaneous QHS  . insulin aspart  0-9 Units Subcutaneous TID WC  . insulin glargine  20 Units Subcutaneous QHS  . levothyroxine  100 mcg Oral Daily  . lidocaine      . ondansetron (ZOFRAN) IV  4 mg Intravenous Q6H  . pantoprazole  40 mg Oral Q1200  . rosuvastatin  40 mg Oral q1800  . DISCONTD: diazepam  5 mg Oral On Call  . DISCONTD: insulin glargine  20 Units Subcutaneous Daily  . DISCONTD: sodium chloride  3 mL Intravenous Q12H    Infusions:    . DISCONTD: sodium chloride 10 mL/hr at 07/29/11 0751  . DISCONTD: dextrose 5 % and 0.9% NaCl 10 mL/hr at 07/29/11 1400  . DISCONTD: dextrose 5 % and 0.9%  NaCl Stopped (07/29/11 1600)  . DISCONTD: nitroGLYCERIN      Allergies  Allergen Reactions  . Sulfa Antibiotics Anaphylaxis and Swelling    History   Social History  . Marital Status: Married    Spouse Name: Grayland Ormond [phone]; Son Rush Landmark    Number of Children: 2  . Years of Education: N/A   Occupational History  . Not on file.   Social History Main Topics  . Smoking status: Never Smoker   . Smokeless tobacco: Not on file  . Alcohol Use: No  . Drug Use: No  . Sexually Active: Not on file   Other Topics Concern  . Not on file   Social History Narrative  . No narrative on file    Family History  Problem Relation Age of Onset  . Heart disease Father   . Hypertension Mother   . Multiple sclerosis Father     PHYSICAL EXAM: Filed Vitals:   07/29/11 2000  BP: 111/81  Pulse: 65  Temp: 97.7 F (36.5 C)  Resp: 20     Intake/Output Summary (Last 24 hours) at 07/29/11 2042 Last data filed at 07/29/11 1500  Gross per 24 hour  Intake    982 ml  Output   1100 ml  Net   -118 ml    General: Obese female in no acute distress. Fatigued Head: Normocephalic, atraumatic, sclera non-icteric.  Neck: Negative for  carotid bruits. JVP to ear Lungs: Clear bilaterally to auscultation without wheezes, rales, or rhonchi. Breathing is unlabored.  Heart: RRR S1 S2. Prominent s3. 2/6 TR Abdomen: Obese non-tender, non-distended with normoactive bowel sounds. No hepatomegaly. No rebound/guarding. No obvious abdominal masses.  Extremities: 3+ edema. Ulcers are dressed. No cyanosis or clubbing Neuro: Alert and oriented X 3. Moves all extremities spontaneously.  Psych: Responds to questions appropriately with a normal affect.  ECG: 11/16.  NSR 72 inferior Q. No ST-T wave abnormalities.    Results for orders placed during the hospital encounter of 07/25/11 (from the past 24 hour(s))  GLUCOSE, CAPILLARY     Status: Normal   Collection Time   07/28/11  9:37 PM      Component Value Range     Glucose-Capillary 92  70 - 99 (mg/dL)  CBC     Status: Abnormal   Collection Time   07/29/11  7:25 AM      Component Value Range   WBC 7.2  4.0 - 10.5 (K/uL)   RBC 5.17 (*) 3.87 - 5.11 (MIL/uL)   Hemoglobin 15.1 (*) 12.0 - 15.0 (g/dL)   HCT 43.7  36.0 - 46.0 (%)   MCV 84.5  78.0 - 100.0 (fL)   MCH 29.2  26.0 - 34.0 (pg)   MCHC 34.6  30.0 - 36.0 (g/dL)   RDW 19.1 (*) 11.5 - 15.5 (%)   Platelets 214  150 - 400 (K/uL)  BASIC METABOLIC PANEL     Status: Abnormal   Collection Time   07/29/11  7:25 AM      Component Value Range   Sodium 139  135 - 145 (mEq/L)   Potassium 3.6  3.5 - 5.1 (mEq/L)   Chloride 102  96 - 112 (mEq/L)   CO2 25  19 - 32 (mEq/L)   Glucose, Bld 40 (*) 70 - 99 (mg/dL)   BUN 15  6 - 23 (mg/dL)   Creatinine, Ser 0.83  0.50 - 1.10 (mg/dL)   Calcium 8.5  8.4 - 10.5 (mg/dL)   GFR calc non Af Amer 75 (*) >90 (mL/min)   GFR calc Af Amer 86 (*) >90 (mL/min)  GLUCOSE, CAPILLARY     Status: Abnormal   Collection Time   07/29/11  8:33 AM      Component Value Range   Glucose-Capillary 43 (*) 70 - 99 (mg/dL)  GLUCOSE, CAPILLARY     Status: Abnormal   Collection Time   07/29/11  8:34 AM      Component Value Range   Glucose-Capillary 40 (*) 70 - 99 (mg/dL)  GLUCOSE, CAPILLARY     Status: Abnormal   Collection Time   07/29/11  9:13 AM      Component Value Range   Glucose-Capillary 103 (*) 70 - 99 (mg/dL)  GLUCOSE, CAPILLARY     Status: Abnormal   Collection Time   07/29/11 11:35 AM      Component Value Range   Glucose-Capillary 49 (*) 70 - 99 (mg/dL)  GLUCOSE, CAPILLARY     Status: Normal   Collection Time   07/29/11 12:10 PM      Component Value Range   Glucose-Capillary 83  70 - 99 (mg/dL)  POCT I-STAT 3, BLOOD GAS (G3P V)     Status: Abnormal   Collection Time   07/29/11  1:21 PM      Component Value Range   pH, Ven 7.389 (*) 7.250 - 7.300    pCO2, Ven 45.5  45.0 -  50.0 (mmHg)   pO2, Ven 25.0 (*) 30.0 - 45.0 (mmHg)   Bicarbonate 27.5 (*) 20.0 - 24.0  (mEq/L)   TCO2 29  0 - 100 (mmol/L)   O2 Saturation 44.0     Acid-Base Excess 2.0  0.0 - 2.0 (mmol/L)   Sample type VENOUS     Comment NOTIFIED PHYSICIAN    POCT I-STAT 3, BLOOD GAS (G3P V)     Status: Abnormal   Collection Time   07/29/11  1:26 PM      Component Value Range   pH, Ven 7.388 (*) 7.250 - 7.300    pCO2, Ven 45.8  45.0 - 50.0 (mmHg)   pO2, Ven 23.0 (*) 30.0 - 45.0 (mmHg)   Bicarbonate 27.6 (*) 20.0 - 24.0 (mEq/L)   TCO2 29  0 - 100 (mmol/L)   O2 Saturation 37.0     Acid-Base Excess 2.0  0.0 - 2.0 (mmol/L)   Sample type VENOUS     Comment NOTIFIED PHYSICIAN    GLUCOSE, CAPILLARY     Status: Abnormal   Collection Time   07/29/11  1:38 PM      Component Value Range   Glucose-Capillary 61 (*) 70 - 99 (mg/dL)  GLUCOSE, CAPILLARY     Status: Abnormal   Collection Time   07/29/11  4:30 PM      Component Value Range   Glucose-Capillary 261 (*) 70 - 99 (mg/dL)  MRSA PCR SCREENING     Status: Normal   Collection Time   07/29/11  5:29 PM      Component Value Range   MRSA by PCR NEGATIVE  NEGATIVE    No results found.   ASSESSMENT: 1) Cardiogenic shock 2) Acute systolic HF secondary to ischemic CM, EF 25-30% 3) 3v-CAD, severe         --LAD 80% mid LCX 80% prox, OM1 95% RCA T prox with R->R & L-> R collats 4) Morbid obesity 5) DM2 6) Leg ulcers 7) Hyperlipidemia 8) Moderate to severe MR/TR  PLAN/DISCUSSION:  Given her diabetes, 3v-CAD and reduced EF best option would ideally be for CABG however she is currently too high risk. Agree with plan for cardiac MRI. If mostly viable would favor aggressive plan of trying to optimize her for surgery including optimization of her HF, wound consult to manage wounds and PT to improve mobility. This will likely take 2-3 weeks.  Will have PICC line placed to allow optimization of filling pressures and cardiac output with CVP measurements and co-oximetry. Start milrinone, IV lasix, digoxin, spironolactone and ACE-I. Continue  low-dose carvedilol as tolerated. Will need aggressive titration of ACE-I as tolerated. HF team will follow.   Discussed with Dr. Prescott Gum.   Total time spent with patient and reviewing records = 55 minutes.

## 2011-07-29 NOTE — Progress Notes (Signed)
CBG: 40  Treatment: D50 IV 25 mL Symptoms: None Follow-up CBG: Time:0910 CBG Result:103  Possible Reasons for Event: Other: NPO for procedure Comments/MD notified:[name] Barrett, PA    Nelida Meuse

## 2011-07-29 NOTE — Progress Notes (Signed)
Subjective:    Bridget Martinez is a 60 yo with hx of  HTN, hyperlipidemia, CHF and CAD who presented on 11/15  with DKA, bilateral leg ulcers .  She was noted to have abnormal cardiac enzymes and cardiac cath ( 11/15)  revealed significant CAD.  Echo 11/16 reveals a severely depressed LV function with an EF of 25-30% with mod-severe MR, TR  She has been seen by Dr. Tharon Aquas Trigt who did not think she was a good surgical candidate at this point   She is scheduled for a right heart cath today for further evaluation.        Marland Kitchen aspirin  324 mg Oral Pre-Cath  . aspirin EC  325 mg Oral Daily  . carvedilol  3.125 mg Oral BID WC  . diazepam  5 mg Oral On Call  . doxycycline  100 mg Oral Q12H  . escitalopram  10 mg Oral Daily  .  HYDROmorphone (DILAUDID) injection  0.5 mg Intravenous Once  . insulin aspart  0-5 Units Subcutaneous QHS  . insulin aspart  0-9 Units Subcutaneous TID WC  . insulin glargine  20 Units Subcutaneous Daily  . insulin glargine  20 Units Subcutaneous QHS  . levothyroxine  100 mcg Oral Daily  . ondansetron (ZOFRAN) IV  4 mg Intravenous Q6H  . pantoprazole  40 mg Oral Q1200  . rosuvastatin  40 mg Oral q1800  . DISCONTD: diazepam  5 mg Oral On Call  . DISCONTD: furosemide  40 mg Oral Daily  . DISCONTD: potassium chloride  40 mEq Oral Daily  . DISCONTD: sodium chloride  3 mL Intravenous Q12H      . sodium chloride 10 mL/hr at 07/29/11 0751  . nitroGLYCERIN    . DISCONTD: sodium chloride 50 mL/hr at 07/27/11 0100    Objective:  Vital Signs in the last 24 hours: Blood pressure 126/76, pulse 74, temperature 98.2 F (36.8 C), temperature source Oral, resp. rate 17, height 5\' 7"  (1.702 m), weight 208 lb 1.8 oz (94.4 kg), SpO2 97.00%. Temp:  [97.4 F (36.3 C)-98.3 F (36.8 C)] 98.2 F (36.8 C) (11/19 0400) Pulse Rate:  [74-76] 74  (11/19 0751) Resp:  [17-18] 17  (11/19 0751) BP: (96-126)/(69-85) 126/76 mmHg (11/19 0751) SpO2:  [94 %-99 %] 97 % (11/18 2000) Weight:   [208 lb 1.8 oz (94.4 kg)] 208 lb 1.8 oz (94.4 kg) (11/18 1949)  Intake/Output from previous day: 11/18 0701 - 11/19 0700 In: 900 [P.O.:900] Out: 1600 [Urine:1600] Intake/Output from this shift:    Physical Exam: The patient is alert and oriented x 3.  The mood and affect are normal.   Skin: warm and dry.  Color is normal.    HEENT:   the sclera are nonicteric.  The mucous membranes are moist.  The carotids are 2+ without bruits.  There is no thyromegaly.  There is no JVD.    Lungs: clear.  The chest wall is non tender.    Heart: regular rate with a normal S1 and S2.  There are no murmurs, gallops, or rubs. The PMI is not displaced.     Abdomen: good bowel sounds.  There is no guarding or rebound.  There is no hepatosplenomegaly or tenderness.  There are no masses.   Extremities:  1+ pitting edema,  Bilateral leg ulcers  Neuro:  Cranial nerves II - XII are intact.  Motor and sensory functions are intact.     Lab Results:  [facility] 07/28/11 [id number] 07/27/11  0740  WBC 7.1 6.9  HGB 15.2* 14.7  PLT 194 200    Basename 07/28/11 0024 07/27/11 1201  NA 135 133*  K 4.1 3.4*  CL 101 103  CO2 23 17*  GLUCOSE 107* 226*  BUN 20 19  CREATININE 1.09 0.87    Basename 07/26/11 0900  TROPONINI 4.60*   No results found for this basename: BNP in the last 72 hours Hepatic Function Panel No results found for this basename: PROT,ALBUMIN,AST,ALT,ALKPHOS,BILITOT,BILIDIR,IBILI in the last 72 hours Lab Results  Component Value Date   CHOL 131 07/26/2011   HDL 58 07/26/2011   LDLCALC 35 07/26/2011   TRIG 189* 07/26/2011   CHOLHDL 2.3 07/26/2011    Basename 07/26/11 0900  INR 1.41    Assessment/Plan:   1.  CAD, multiple vessel (07/27/2011)  She has severe 3 v CAD but is a relatively poor surgical candidate.  Further evaluation today including a right heart cath  2.  Hypertension:     3.  Hyperlipidemia:  4:  CHF:      Ramond Dial., MD, [facility] 07/29/2011, 8:02  AM

## 2011-07-30 ENCOUNTER — Encounter (HOSPITAL_COMMUNITY): Payer: Self-pay | Admitting: Cardiothoracic Surgery

## 2011-07-30 ENCOUNTER — Encounter (HOSPITAL_COMMUNITY): Payer: Self-pay

## 2011-07-30 ENCOUNTER — Inpatient Hospital Stay (HOSPITAL_COMMUNITY): Payer: BC Managed Care – PPO

## 2011-07-30 LAB — CBC
HCT: 42.8 % (ref 36.0–46.0)
MCH: 29.1 pg (ref 26.0–34.0)
MCV: 85.4 fL (ref 78.0–100.0)
RBC: 5.01 MIL/uL (ref 3.87–5.11)
WBC: 6.9 10*3/uL (ref 4.0–10.5)

## 2011-07-30 LAB — GLUCOSE, CAPILLARY
Glucose-Capillary: 43 mg/dL — CL (ref 70–99)
Glucose-Capillary: 141 mg/dL — ABNORMAL HIGH (ref 70–99)
Glucose-Capillary: 133 mg/dL — ABNORMAL HIGH (ref 70–99)
Glucose-Capillary: 178 mg/dL — ABNORMAL HIGH (ref 70–99)

## 2011-07-30 LAB — BASIC METABOLIC PANEL
BUN: 16 mg/dL (ref 6–23)
CO2: 27 mEq/L (ref 19–32)
Chloride: 97 mEq/L (ref 96–112)
Creatinine, Ser: 1.25 mg/dL — ABNORMAL HIGH (ref 0.50–1.10)

## 2011-07-30 LAB — CARBOXYHEMOGLOBIN
Carboxyhemoglobin: 1.3 % (ref 0.5–1.5)
O2 Saturation: 86.4 %
Total hemoglobin: 15.2 g/dL (ref 12.5–16.0)

## 2011-07-30 MED ORDER — SODIUM CHLORIDE 0.9 % IJ SOLN
10.0000 mL | INTRAMUSCULAR | Status: DC | PRN
  Administered 2011-08-07 – 2011-08-11 (×6): 10 mL

## 2011-07-30 MED ORDER — DIGOXIN 125 MCG PO TABS
0.1250 mg | ORAL_TABLET | Freq: Every day | ORAL | Status: DC
  Administered 2011-07-30 – 2011-08-12 (×14): 0.125 mg via ORAL
  Filled 2011-07-30 (×14): qty 1

## 2011-07-30 MED ORDER — ENALAPRIL MALEATE 2.5 MG PO TABS
2.5000 mg | ORAL_TABLET | Freq: Two times a day (BID) | ORAL | Status: DC
  Administered 2011-07-30 – 2011-08-03 (×8): 2.5 mg via ORAL
  Filled 2011-07-30 (×12): qty 1

## 2011-07-30 MED ORDER — GADOBENATE DIMEGLUMINE 529 MG/ML IV SOLN
20.0000 mL | Freq: Once | INTRAVENOUS | Status: AC
  Administered 2011-07-30: 20 mL via INTRAVENOUS

## 2011-07-30 MED ORDER — ENOXAPARIN SODIUM 40 MG/0.4ML ~~LOC~~ SOLN
40.0000 mg | SUBCUTANEOUS | Status: DC
  Administered 2011-07-30 – 2011-08-08 (×10): 40 mg via SUBCUTANEOUS
  Filled 2011-07-30 (×10): qty 0.4

## 2011-07-30 MED ORDER — ESCITALOPRAM OXALATE 20 MG PO TABS
20.0000 mg | ORAL_TABLET | Freq: Every day | ORAL | Status: DC
  Administered 2011-07-30 – 2011-08-12 (×14): 20 mg via ORAL
  Filled 2011-07-30 (×14): qty 1

## 2011-07-30 MED ORDER — SODIUM CHLORIDE 0.9 % IV SOLN
INTRAVENOUS | Status: DC
  Administered 2011-07-30: 3 mL/h via INTRAVENOUS
  Administered 2011-07-31 – 2011-08-05 (×4): via INTRAVENOUS

## 2011-07-30 NOTE — Progress Notes (Signed)
1 Day Post-Op Procedure(s) (LRB): RIGHT HEART CATH (N/A)                    [address]            [place] [address]          [phone]           Subjective:FEELS BETTER ON MILRINONE, OOb to chair  Objective: Vital signs in last 24 hours: Temp:  [96 F (35.6 C)-97.7 F (36.5 C)] 97.3 F (36.3 C) (11/20 0821) Pulse Rate:  [68-78] 78  (11/20 0825) Cardiac Rhythm:  [-] Normal sinus rhythm (11/20 0800) Resp:  [16-20] 16  (11/20 0800) BP: (100-141)/(59-88) 141/78 mmHg (11/20 0825) SpO2:  [98 %-100 %] 99 % (11/20 0825) Weight:  [200 lb 2.8 oz (90.8 kg)] 200 lb 2.8 oz (90.8 kg) (11/20 0400)  Hemodynamic parameters for last 24 hours:  stable  Intake/Output from previous day: 11/19 0701 - 11/20 0700 In: 626 [P.O.:480; I.V.:142; IV Piggyback:4] Out: 2500 [Urine:2500] Intake/Output this shift: Total I/O In: -  Out: 800 [Urine:800]  3+ edema, NSR  + S3 sound  Lab Results:  [facility] 07/30/11 0620 07/29/11 0725  WBC 6.9 7.2  HGB 14.6 15.1*  HCT 42.8 43.7  PLT 170 214   BMET:  Basename 07/30/11 0620 07/29/11 0725  NA 136 139  K 4.5 3.6  CL 97 102  CO2 27 25  GLUCOSE 130* 40*  BUN 16 15  CREATININE 1.25* 0.83  CALCIUM 8.1* 8.5    PT/INR: No results found for this basename: LABPROT,INR in the last 72 hours ABG    Component Value Date/Time   HCO3 27.6* 07/29/2011 1326   TCO2 29 07/29/2011 1326   ACIDBASEDEF 14.0* 07/25/2011 0303   O2SAT 37.0 07/29/2011 1326   CBG (last 3)  Basename 07/30/11 0823 07/30/11 0322 07/29/11 2301  GLUCAP 133* 141* 138*    Assessment/Plan: S/P Procedure(s) (LRB): RIGHT HEART CATH (N/A) Agree w/ plan for viability study, optimize CHF Will need another RHC just before CABG   LOS: 5 days    VAN TRIGT III,PETER 07/30/2011

## 2011-07-30 NOTE — Progress Notes (Signed)
Subjective:   60 yo female with DM2, obesity, NSTEMI found to have severe 3 vessel CAD with graftable vessels but currently with cardiogenic shock in the setting of reduced EF secondary to ICM, EF 25-30%.  Medical regimen being titrated to see if she can be an acceptable surgical candidate. RHC on 11/18 with PCWP 25 and CI 1.2  She feels much better today.  She was placed on milrinone, IV lasix yesterday after her RHC and diuresed well, down 4 kg.  No CP, orthopnea or PND.  Fatigue improved.  No nausea or vomiting.    BMET pending.  PICC line to be placed this am.   Intake/Output Summary (Last 24 hours) at 07/30/11 0909 Last data filed at 07/30/11 0800  Gross per 24 hour  Intake    596 ml  Output   3300 ml  Net  -2704 ml    Current meds:    . aspirin EC  325 mg Oral Daily  . carvedilol  3.125 mg Oral BID WC  . diazepam  5 mg Oral On Call  . digoxin  0.125 mg Oral Daily  . doxycycline  100 mg Oral Q12H  . enalapril  2.5 mg Oral BID WC  . escitalopram  10 mg Oral Daily  . furosemide  40 mg Intravenous BID  . heparin      .  HYDROmorphone (DILAUDID) injection  0.5 mg Intravenous Once  . insulin aspart  0-5 Units Subcutaneous QHS  . insulin aspart  0-9 Units Subcutaneous TID WC  . insulin glargine  20 Units Subcutaneous QHS  . levothyroxine  100 mcg Oral Daily  . lidocaine      . pantoprazole  40 mg Oral Q1200  . rosuvastatin  40 mg Oral q1800  . spironolactone  12.5 mg Oral Daily  . DISCONTD: enalapril  2.5 mg Oral BID WC  . DISCONTD: insulin glargine  20 Units Subcutaneous Daily  . DISCONTD: ondansetron (ZOFRAN) IV  4 mg Intravenous Q6H   Infusions:    . sodium chloride 3 mL/hr at 07/30/11 0800  . milrinone 0.25 mcg/kg/min (07/30/11 0800)  . DISCONTD: dextrose 5 % and 0.9% NaCl 10 mL/hr at 07/29/11 1400  . DISCONTD: dextrose 5 % and 0.9% NaCl Stopped (07/29/11 1600)  . DISCONTD: nitroGLYCERIN       Objective:  Blood pressure 121/88, pulse 74, temperature 97.3 F  (36.3 C), temperature source Oral, resp. rate 16, height 5\' 7"  (1.702 m), weight 90.8 kg (200 lb 2.8 oz), SpO2 100.00%. Weight change: -3.6 kg (-7 lb 15 oz)  Physical Exam: General:  Obese, NAD. No resp difficulty HEENT: normal Neck: supple. JVP 12-13 . Carotids 2+ bilat; no bruits. No lymphadenopathy or thryomegaly appreciated. Cor: PMI nondisplaced. Regular rate & rhythm. +S3, 2/6 TR. Lungs: clear Abdomen: obese, soft, nontender, nondistended. No hepatosplenomegaly. No bruits or masses. Good bowel sounds. Extremities: no cyanosis, clubbing, rash, tibial ulcers with dressings, SCDs in place, 2+ edema.  Neuro: alert & orientedx3, cranial nerves grossly intact. moves all 4 extremities w/o difficulty. Affect pleasant  Telemetry: SR 70-80  Lab Results: Basic Metabolic Panel:  Lab [id number] 0620 07/29/11 0725 07/28/11 0024 07/27/11 1201 07/25/11 1723  NA 136 139 135 133* 134*  K 4.5 3.6 -- -- --  CL 97 102 101 103 98  CO2 27 25 23  17* 22  GLUCOSE 130* 40* 107* 226* 128*  BUN 16 15 20 19 22   CREATININE 1.25* 0.83 1.09 0.87 1.34*  CALCIUM 8.1* 8.5 8.4 6.9*  8.2*  MG -- -- -- -- --  PHOS -- -- -- -- --   Liver Function Tests:  Lab 07/26/11 0020  AST 93*  ALT 30  ALKPHOS 146*  BILITOT 0.8  PROT 5.3*  ALBUMIN 2.6*   No results found for this basename: LIPASE:5,AMYLASE:5 in the last 168 hours No results found for this basename: AMMONIA:5 in the last 168 hours CBC:  Lab 07/30/11 0620 07/29/11 0725 07/28/11 0620 07/27/11 0740 07/26/11 0900 07/25/11 0152  WBC 6.9 7.2 7.1 6.9 9.7 --  NEUTROABS -- -- -- -- -- 12.9*  HGB 14.6 15.1* 15.2* 14.7 15.0 --  HCT 42.8 43.7 43.4 43.9 42.9 --  MCV 85.4 84.5 84.8 85.4 84.1 --  PLT 170 214 194 200 224 --   Cardiac Enzymes:  Lab 07/26/11 0900 07/26/11 0019  CKTOTAL 311* 383*  CKMB 20.3* 30.9*  CKMBINDEX -- --  TROPONINI 4.60* 6.58*   BNP: No results found for this basename: POCBNP:5 in the last 168 hours CBG:  Lab 07/30/11 0823  07/30/11 0322 07/29/11 2301 07/29/11 2230 07/29/11 1630  GLUCAP 133* 141* 138* 30* 261*   Microbiology: No results found for this basename: cult   No results found for this basename: CULT:2,SDES:2 in the last 168 hours  Imaging: No results found.   ASSESSMENT:  1) Cardiogenic shock       -- On Milrinone  2) Acute systolic HF secondary to ischemic CM, EF 25-30%  3) 3v-CAD, severe         --LAD 80% mid LCX 80% prox, OM1 95% RCA T prox with R->R & L-> R collats  4) Morbid obesity  5) DM2  6) Leg ulcers  7) Hyperlipidemia  8) Moderate to severe MR/TR   PLAN/DISCUSSION:  Diuresis much improved on milrinone and IV lasix.  Currently waiting on PICC placement to follow CVPs and Co-oximetries.  ACE-I ordered last night but she has not received the first dose so I will wait to titrate, recheck this afternoon.  Will add digoxin.  Continue optimization for possible CABG.  Plan for Cardiac MRI today to assess viability.    LE wounds evaluated and appear superficial and I do not think they will be an overwhelming impediment at this point. Place UNNA boots. Also will titrate Lexapro to 20 daily given risk of peri-MI exacerbation of depression.    LOS: 5 days    Glori Bickers, MD   07/30/2011, 9:09 AM

## 2011-07-30 NOTE — Progress Notes (Signed)
Physical Therapy Evaluation Patient Details Name: Bridget Martinez MRN: [id number] DOB: June 01, 1951 Today's Date: 07/30/2011  Problem List:  Patient Active Problem List  Diagnoses  . HTN (hypertension)  . Hyperlipidemia  . Venous stasis ulcers  . DKA (diabetic ketoacidoses)  . Depression  . CAD, multiple vessel  . Systolic CHF, acute on chronic  . DM type 1 causing renal disease  . Hypoglycemia    Past Medical History:  Past Medical History  Diagnosis Date  . Diabetes mellitus     on insulin, with h/o DKA   . HTN (hypertension)   . Hyperlipidemia   . Venous stasis ulcers   . Q waves suggestive of previous myocardial infarction     Pt denies h/o AMI, cath, PCI  . Angina   . Tuberculosis   . Hyperthyroidism    Past Surgical History:  Past Surgical History  Procedure Date  . No past surgeries     PT Assessment/Plan/Recommendation PT Assessment Clinical Impression Statement: Pt presented to therapy with generalized deconditioning, poor balance, flat affect.  Pt would benefit from PT while in the acute setting to maximize independence and improve functional mobility to prepare for safe d/c. PT Recommendation/Assessment: Patient will need skilled PT in the acute care venue PT Problem List: Decreased activity tolerance;Decreased balance;Decreased mobility;Decreased safety awareness;Cardiopulmonary status limiting activity Barriers to Discharge: None PT Therapy Diagnosis : Difficulty walking;Abnormality of gait;Generalized weakness PT Plan PT Frequency: Min 3X/week PT Treatment/Interventions: Gait training;Stair training;Functional mobility training;Therapeutic exercise;Balance training;Patient/family education PT Recommendation Follow Up Recommendations: Home health PT Equipment Recommended: Defer to next venue PT Goals  Acute Rehab PT Goals PT Goal Formulation: With patient Time For Goal Achievement: 7 days Pt will go Supine/Side to Sit: Independently PT Goal:  Supine/Side to Sit - Progress: Progressing toward goal Pt will go Sit to Supine/Side: Independently PT Goal: Sit to Supine/Side - Progress: Not met Pt will Transfer Bed to Chair/Chair to Bed: with modified independence PT Transfer Goal: Bed to Chair/Chair to Bed - Progress: Progressing toward goal Pt will Ambulate: >150 feet;with modified independence;with least restrictive assistive device;with gait velocity >(comment) ft/second (>1.8 ft/sec) PT Goal: Ambulate - Progress: Progressing toward goal Pt will Go Up / Down Stairs: 3-5 stairs;with modified independence;with least restrictive assistive device PT Goal: Up/Down Stairs - Progress: Not met  PT Evaluation Precautions/Restrictions  Precautions Precautions: Fall Required Braces or Orthoses: No Restrictions Weight Bearing Restrictions: No Prior Functioning  Home Living Lives With: Spouse;Son Type of Home: House Home Layout: One level Home Access: Stairs to enter Entrance Stairs-Rails: None Entrance Stairs-Number of Steps: 4 Bathroom Shower/Tub: Optometrist: Yes Home Adaptive Equipment: None Prior Function Level of Independence: Independent with basic ADLs;Independent with homemaking with ambulation;Independent with gait;Independent with transfers Able to Take Stairs?: Yes Driving: Yes Cognition Cognition Arousal/Alertness: Awake/alert (Flat affect) Overall Cognitive Status: Appears within functional limits for tasks assessed Cognition - Other Comments: Flat affect Sensation/Coordination   Extremity Assessment RUE Assessment RUE Assessment: Within Functional Limits LUE Assessment LUE Assessment: Within Functional Limits RLE Assessment RLE Assessment: Within Functional Limits LLE Assessment LLE Assessment: Within Functional Limits Mobility (including Balance) Bed Mobility Bed Mobility: Yes Supine to Sit: 5: Supervision;HOB elevated (Comment degrees);With  rails Supine to Sit Details (indicate cue type and reason): Supervision for pt safety. Verbal cues to initiate. Sitting - Scoot to Edge of Bed: 4: Min assist Sitting - Scoot to Amery of Bed Details (indicate cue type and reason): Min A for pt balance.  Verbal cues to initiate weight shifts. Transfers Transfers: Yes Sit to Stand: 4: Min assist;With upper extremity assist;From bed;From toilet Sit to Stand Details (indicate cue type and reason): Min A for pt balance and safety.  Verbal cues to use UE to initiate stand. Stand to Sit: 4: Min assist;With upper extremity assist;With armrests;To chair/3-in-1;To bed;To toilet Stand to Sit Details: Min A for eccentric control and pt balance.  Cues for hand placement on armrests prior to sitting. Ambulation/Gait Ambulation/Gait: Yes Ambulation/Gait Assistance: 4: Min assist Ambulation/Gait Assistance Details (indicate cue type and reason): Min A for pt balance and safety.  Verbal cues for posture and to guide patient. Ambulation Distance (Feet): 100 Feet Assistive device: None Gait Pattern: Lateral trunk lean to right;Shuffle (Increased pronation c L foot > R) Gait velocity: 0.67 Stairs: No Wheelchair Mobility Wheelchair Mobility: No  Posture/Postural Control Posture/Postural Control: No significant limitations Balance Balance Assessed: No Exercise    End of Session PT - End of Session Equipment Utilized During Treatment: Gait belt Activity Tolerance: Patient tolerated treatment well Patient left: in chair;with call bell in reach Nurse Communication: Mobility status for transfers;Mobility status for ambulation General Behavior During Session: Flat affect Cognition: [facility] for tasks performed  Ruel Favors 07/30/2011, 9:51 AM

## 2011-07-30 NOTE — Progress Notes (Signed)
Frazier Butt, Staff RN called physician to change PICC from TL to DL.  Reminded her to modify order

## 2011-07-30 NOTE — Consult Note (Signed)
WOC follow up:   pt seen in ER 07/25/11 for venous ulcerations to bil. LE. FU today for wound progress  Wound type: venous ulcerations with LE edema  Measurement: not measured today  Wound bed: yellow  Drainage (amount, consistency, odor) moderate, no odor  Periwound: intact with some erythema   Dressing procedure/placement/frequency:  Was utilizing silicone foam dressings for management of exudate however would beds still appear yellow,will add silver dressing to address bioburdan/film and noted MD has started PO abtx. Which may improve wound status as well.  Pt agreeable with this plan and discussed addition of silver dressing with bedside RN.  Supplies ordered to bedside, and RN to add silver dressing under silicone foam dressing when it arrives. To be changed M/W/F.   Thanks  Ashiya Kinkead Kellogg, [place] [phone])

## 2011-07-30 NOTE — Progress Notes (Signed)
Subjective: Asymptomatic    Physical Exam: Blood pressure 122/62, pulse 68, temperature 97.4 F (36.3 C), temperature source Oral, resp. rate 16, height 5\' 7"  (1.702 m), weight 90.8 kg (200 lb 2.8 oz), SpO2 99.00%. Alert oriented x3. Distant heart sounds with 2/6 systolic murmur at apex, regular Chest clear to auscultation bilaterally Lower extremity with +2 bilaterally edema with shallow ulcers with purulent base and erythema   Investigations: Results for orders placed during the hospital encounter of 07/25/11 (from the past 48 hour(s))  GLUCOSE, CAPILLARY     Status: Normal   Collection Time   07/28/11  9:37 PM      Component Value Range Comment   Glucose-Capillary 92  70 - 99 (mg/dL)   CBC     Status: Abnormal   Collection Time   07/29/11  7:25 AM      Component Value Range Comment   WBC 7.2  4.0 - 10.5 (K/uL)    RBC 5.17 (*) 3.87 - 5.11 (MIL/uL)    Hemoglobin 15.1 (*) 12.0 - 15.0 (g/dL)    HCT 43.7  36.0 - 46.0 (%)    MCV 84.5  78.0 - 100.0 (fL)    MCH 29.2  26.0 - 34.0 (pg)    MCHC 34.6  30.0 - 36.0 (g/dL)    RDW 19.1 (*) 11.5 - 15.5 (%)    Platelets 214  150 - 400 (K/uL)   BASIC METABOLIC PANEL     Status: Abnormal   Collection Time   07/29/11  7:25 AM      Component Value Range Comment   Sodium 139  135 - 145 (mEq/L)    Potassium 3.6  3.5 - 5.1 (mEq/L)    Chloride 102  96 - 112 (mEq/L)    CO2 25  19 - 32 (mEq/L)    Glucose, Bld 40 (*) 70 - 99 (mg/dL)    BUN 15  6 - 23 (mg/dL)    Creatinine, Ser 0.83  0.50 - 1.10 (mg/dL)    Calcium 8.5  8.4 - 10.5 (mg/dL)    GFR calc non Af Amer 75 (*) >90 (mL/min)    GFR calc Af Amer 86 (*) >90 (mL/min)   GLUCOSE, CAPILLARY     Status: Abnormal   Collection Time   07/29/11  8:33 AM      Component Value Range Comment   Glucose-Capillary 43 (*) 70 - 99 (mg/dL) REPEATED TO VERIFY, CHARGE CREDITED  GLUCOSE, CAPILLARY     Status: Abnormal   Collection Time   07/29/11  8:34 AM      Component Value Range Comment   Glucose-Capillary 40 (*) 70 - 99 (mg/dL)   GLUCOSE, CAPILLARY     Status: Abnormal   Collection Time   07/29/11  9:13 AM      Component Value Range Comment   Glucose-Capillary 103 (*) 70 - 99 (mg/dL)   GLUCOSE, CAPILLARY     Status: Abnormal   Collection Time   07/29/11 11:35 AM      Component Value Range Comment   Glucose-Capillary 49 (*) 70 - 99 (mg/dL)   GLUCOSE, CAPILLARY     Status: Normal   Collection Time   07/29/11 12:10 PM      Component Value Range Comment   Glucose-Capillary 83  70 - 99 (mg/dL)   POCT I-STAT 3, BLOOD GAS (G3P V)     Status: Abnormal   Collection Time   07/29/11  1:21 PM      Component Value Range  Comment   pH, Ven 7.389 (*) 7.250 - 7.300     pCO2, Ven 45.5  45.0 - 50.0 (mmHg)    pO2, Ven 25.0 (*) 30.0 - 45.0 (mmHg)    Bicarbonate 27.5 (*) 20.0 - 24.0 (mEq/L)    TCO2 29  0 - 100 (mmol/L)    O2 Saturation 44.0      Acid-Base Excess 2.0  0.0 - 2.0 (mmol/L)    Sample type VENOUS      Comment NOTIFIED PHYSICIAN     POCT I-STAT 3, BLOOD GAS (G3P V)     Status: Abnormal   Collection Time   07/29/11  1:26 PM      Component Value Range Comment   pH, Ven 7.388 (*) 7.250 - 7.300     pCO2, Ven 45.8  45.0 - 50.0 (mmHg)    pO2, Ven 23.0 (*) 30.0 - 45.0 (mmHg)    Bicarbonate 27.6 (*) 20.0 - 24.0 (mEq/L)    TCO2 29  0 - 100 (mmol/L)    O2 Saturation 37.0      Acid-Base Excess 2.0  0.0 - 2.0 (mmol/L)    Sample type VENOUS      Comment NOTIFIED PHYSICIAN     GLUCOSE, CAPILLARY     Status: Abnormal   Collection Time   07/29/11  1:38 PM      Component Value Range Comment   Glucose-Capillary 61 (*) 70 - 99 (mg/dL)   GLUCOSE, CAPILLARY     Status: Abnormal   Collection Time   07/29/11  4:30 PM      Component Value Range Comment   Glucose-Capillary 261 (*) 70 - 99 (mg/dL)   MRSA PCR SCREENING     Status: Normal   Collection Time   07/29/11  5:29 PM      Component Value Range Comment   MRSA by PCR NEGATIVE  NEGATIVE    GLUCOSE, CAPILLARY     Status:  Abnormal   Collection Time   07/29/11 10:30 PM      Component Value Range Comment   Glucose-Capillary 30 (*) 70 - 99 (mg/dL)    Comment 1 Notify RN      Comment 2 Documented in Chart     GLUCOSE, CAPILLARY     Status: Abnormal   Collection Time   07/29/11 11:01 PM      Component Value Range Comment   Glucose-Capillary 138 (*) 70 - 99 (mg/dL)    Comment 1 Notify RN      Comment 2 Documented in Chart     GLUCOSE, CAPILLARY     Status: Abnormal   Collection Time   07/30/11  3:22 AM      Component Value Range Comment   Glucose-Capillary 141 (*) 70 - 99 (mg/dL)   CBC     Status: Abnormal   Collection Time   07/30/11  6:20 AM      Component Value Range Comment   WBC 6.9  4.0 - 10.5 (K/uL)    RBC 5.01  3.87 - 5.11 (MIL/uL)    Hemoglobin 14.6  12.0 - 15.0 (g/dL)    HCT 42.8  36.0 - 46.0 (%)    MCV 85.4  78.0 - 100.0 (fL)    MCH 29.1  26.0 - 34.0 (pg)    MCHC 34.1  30.0 - 36.0 (g/dL)    RDW 19.2 (*) 11.5 - 15.5 (%)    Platelets 170  150 - 400 (K/uL)   BASIC METABOLIC PANEL  Status: Abnormal   Collection Time   07/30/11  6:20 AM      Component Value Range Comment   Sodium 136  135 - 145 (mEq/L)    Potassium 4.5  3.5 - 5.1 (mEq/L) HEMOLYSIS AT THIS LEVEL MAY AFFECT RESULT   Chloride 97  96 - 112 (mEq/L)    CO2 27  19 - 32 (mEq/L)    Glucose, Bld 130 (*) 70 - 99 (mg/dL)    BUN 16  6 - 23 (mg/dL)    Creatinine, Ser 1.25 (*) 0.50 - 1.10 (mg/dL) DELTA CHECK NOTED   Calcium 8.1 (*) 8.4 - 10.5 (mg/dL)    GFR calc non Af Amer 45 (*) >90 (mL/min)    GFR calc Af Amer 53 (*) >90 (mL/min)   GLUCOSE, CAPILLARY     Status: Abnormal   Collection Time   07/30/11  8:23 AM      Component Value Range Comment   Glucose-Capillary 133 (*) 70 - 99 (mg/dL)   GLUCOSE, CAPILLARY     Status: Abnormal   Collection Time   07/30/11 11:30 AM      Component Value Range Comment   Glucose-Capillary 178 (*) 70 - 99 (mg/dL)       Medications:  Scheduled:    . aspirin EC  325 mg Oral Daily  .  carvedilol  3.125 mg Oral BID WC  . digoxin  0.125 mg Oral Daily  . doxycycline  100 mg Oral Q12H  . enalapril  2.5 mg Oral BID WC  . enoxaparin (LOVENOX) injection  40 mg Subcutaneous Q24H  . escitalopram  20 mg Oral Daily  . furosemide  40 mg Intravenous BID  .  HYDROmorphone (DILAUDID) injection  0.5 mg Intravenous Once  . insulin aspart  0-5 Units Subcutaneous QHS  . insulin aspart  0-9 Units Subcutaneous TID WC  . insulin glargine  20 Units Subcutaneous QHS  . levothyroxine  100 mcg Oral Daily  . pantoprazole  40 mg Oral Q1200  . rosuvastatin  40 mg Oral q1800  . spironolactone  12.5 mg Oral Daily  . DISCONTD: enalapril  2.5 mg Oral BID WC  . DISCONTD: escitalopram  10 mg Oral Daily  . DISCONTD: ondansetron (ZOFRAN) IV  4 mg Intravenous Q6H    Impression:   1. CAD, multiple vessel - [place] cardiology following. Cardiac  MRI  to assess myocardial viability is pending Cardiothoracic surgery is following to assess patient's candidacy for CABG.  2.  Nausea and vomiting on admission - resolved 3.  Hyperlipidemia: Continue statin  4. Venous stasis ulcers: Elevation, antibiotics,  5. Hypokalemia; -  due to diuretic. Resolved 6.  DKA (diabetic ketoacidoses) - resolved 7.  Depression 8.  Systolic CHF, acute on chronic: Diuresis carefully with a close eye on blood pressure and creatinine. Patient is now on Milrinone iv per Dr. Dannielle Burn  9.  DM type 1 causing renal disease - A1C 8.9 10. Protein caloric malnutrition        LOS: 5 days   Bridget Martinez 07/30/2011, 4:38 PM

## 2011-07-31 LAB — BASIC METABOLIC PANEL
BUN: 13 mg/dL (ref 6–23)
CO2: 38 mEq/L — ABNORMAL HIGH (ref 19–32)
Calcium: 8 mg/dL — ABNORMAL LOW (ref 8.4–10.5)
Chloride: 92 mEq/L — ABNORMAL LOW (ref 96–112)
Creatinine, Ser: 0.88 mg/dL (ref 0.50–1.10)
Creatinine, Ser: 0.87 mg/dL (ref 0.50–1.10)
GFR calc Af Amer: 81 mL/min — ABNORMAL LOW (ref >90)
GFR calc non Af Amer: 69 mL/min — ABNORMAL LOW (ref >90)
GFR calc non Af Amer: 70 mL/min — ABNORMAL LOW (ref >90)
Glucose, Bld: 68 mg/dL — ABNORMAL LOW (ref 70–99)
Glucose, Bld: 188 mg/dL — ABNORMAL HIGH (ref 70–99)
Potassium: 2.5 mEq/L — CL (ref 3.5–5.1)

## 2011-07-31 LAB — CBC
HCT: 41.9 % (ref 36.0–46.0)
Hemoglobin: 14.2 g/dL (ref 12.0–15.0)
MCHC: 33.9 g/dL (ref 30.0–36.0)
MCV: 85.2 fL (ref 78.0–100.0)
RDW: 18.7 % — ABNORMAL HIGH (ref 11.5–15.5)

## 2011-07-31 LAB — GLUCOSE, CAPILLARY
Glucose-Capillary: 166 mg/dL — ABNORMAL HIGH (ref 70–99)
Glucose-Capillary: 146 mg/dL — ABNORMAL HIGH (ref 70–99)

## 2011-07-31 LAB — CARBOXYHEMOGLOBIN
Carboxyhemoglobin: 1.2 % (ref 0.5–1.5)
Methemoglobin: 0.5 % (ref 0.0–1.5)

## 2011-07-31 MED ORDER — POTASSIUM CHLORIDE CRYS ER 20 MEQ PO TBCR
40.0000 meq | EXTENDED_RELEASE_TABLET | Freq: Once | ORAL | Status: AC
  Administered 2011-07-31: 40 meq via ORAL
  Filled 2011-07-31: qty 2

## 2011-07-31 MED ORDER — POTASSIUM CHLORIDE CRYS ER 20 MEQ PO TBCR
20.0000 meq | EXTENDED_RELEASE_TABLET | Freq: Two times a day (BID) | ORAL | Status: DC
  Administered 2011-07-31: 20 meq via ORAL
  Filled 2011-07-31 (×3): qty 1

## 2011-07-31 MED ORDER — POTASSIUM CHLORIDE 20 MEQ/15ML (10%) PO LIQD
40.0000 meq | Freq: Once | ORAL | Status: AC
  Administered 2011-07-31: 40 meq via ORAL
  Filled 2011-07-31: qty 30

## 2011-07-31 MED ORDER — POTASSIUM CHLORIDE 20 MEQ/15ML (10%) PO LIQD
ORAL | Status: AC
  Administered 2011-07-31: 40 meq via ORAL
  Filled 2011-07-31: qty 30

## 2011-07-31 MED ORDER — SPIRONOLACTONE 25 MG PO TABS
25.0000 mg | ORAL_TABLET | Freq: Every day | ORAL | Status: DC
  Administered 2011-08-01 – 2011-08-07 (×7): 25 mg via ORAL
  Filled 2011-07-31 (×7): qty 1

## 2011-07-31 NOTE — Progress Notes (Signed)
Bridget Martinez is a 60 y.o. female patient admitted on 07/25/11 with nausea, found to have AMI/DKA. Patient being considered for CABG. I have reviewed her chart, seen and examined her at bed side in the presence of her husband and daughter Cardiology/CTVS appreciated.  SUBJECTIVE "I feel a whole lot better". Denies SOB or chest pain.   1. DKA (diabetic ketoacidoses)   2. HTN (hypertension)   3. Hyperlipidemia   4. Venous stasis ulcers   5. Q waves suggestive of previous myocardial infarction   6. CAD, multiple vessel   7. Systolic CHF, acute on chronic     Past Medical History  Diagnosis Date  . Diabetes mellitus     on insulin, with h/o DKA   . HTN (hypertension)   . Hyperlipidemia   . Venous stasis ulcers   . Q waves suggestive of previous myocardial infarction     Pt denies h/o AMI, cath, PCI  . Angina   . Tuberculosis   . Hyperthyroidism    Current Facility-Administered Medications  Medication Dose Route Frequency Provider Last Rate Last Dose  . 0.9 %  sodium chloride infusion   Intravenous Continuous Jolaine Artist, MD 13 mL/hr at 07/30/11 2012    . acetaminophen (TYLENOL) tablet 650 mg  650 mg Oral Q4H PRN Loralie Champagne, MD   650 mg at 07/29/11 1802  . aspirin EC tablet 325 mg  325 mg Oral Daily Lonn Georgia, PA   325 mg at 07/31/11 1018  . carvedilol (COREG) tablet 3.125 mg  3.125 mg Oral BID WC Loralie Champagne, MD   3.125 mg at 07/31/11 0755  . digoxin (LANOXIN) tablet 0.125 mg  0.125 mg Oral Daily Wynetta Emery, PA   0.125 mg at 07/31/11 1018  . doxycycline (VIBRA-TABS) tablet 100 mg  100 mg Oral Q12H Sorin Laza   100 mg at 07/31/11 1018  . enalapril (VASOTEC) tablet 2.5 mg  2.5 mg Oral BID WC Wynetta Emery, PA   2.5 mg at 07/31/11 0755  . enoxaparin (LOVENOX) injection 40 mg  40 mg Subcutaneous Q24H Nadine Counts, PHARMD   40 mg at 07/31/11 1019  . escitalopram (LEXAPRO) tablet 20 mg  20 mg Oral Daily Nadine Counts, PHARMD   20 mg at 07/31/11  1018  . furosemide (LASIX) injection 40 mg  40 mg Intravenous BID Jolaine Artist, MD   40 mg at 07/31/11 0755  . gadobenate dimeglumine (MULTIHANCE) injection 20 mL  20 mL Intravenous Once Medication Radiologist   20 mL at 07/30/11 2002  . HYDROmorphone (DILAUDID) injection 0.5 mg  0.5 mg Intravenous Once Dianne Dun      . insulin aspart (novoLOG) injection 0-5 Units  0-5 Units Subcutaneous QHS Saima Rizwan   0 Units at 07/30/11 2226  . insulin aspart (novoLOG) injection 0-9 Units  0-9 Units Subcutaneous TID WC Saima Rizwan   2 Units at 07/31/11 1227  . insulin glargine (LANTUS) injection 20 Units  20 Units Subcutaneous QHS Sorin Laza   20 Units at 07/30/11 2225  . levothyroxine (SYNTHROID, LEVOTHROID) tablet 100 mcg  100 mcg Oral Daily Ria Bush, MD   100 mcg at 07/31/11 1018  . milrinone (PRIMACOR) infusion 200 mcg/mL (0.2 mg/mL)  0.25 mcg/kg/min Intravenous Continuous Shaune Pascal [name], MD 7.1 mL/hr at 07/31/11 0800 0.25 mcg/kg/min at 07/31/11 0800  . ondansetron (ZOFRAN) injection 4 mg  4 mg Intravenous Q6H PRN Loralie Champagne, MD   4 mg at 07/31/11 1036  .  pantoprazole (PROTONIX) EC tablet 40 mg  40 mg Oral Q1200 Sorin Laza   40 mg at 07/31/11 1227  . potassium chloride SA (K-DUR,KLOR-CON) CR tablet 20 mEq  20 mEq Oral BID Wellington Hampshire, MD      . potassium chloride SA (K-DUR,KLOR-CON) CR tablet 40 mEq  40 mEq Oral Once Sueanne Margarita, MD   40 mEq at 07/31/11 [id number]  . potassium chloride SA (K-DUR,KLOR-CON) CR tablet 40 mEq  40 mEq Oral Once Sueanne Margarita, MD   40 mEq at 07/31/11 1018  . rosuvastatin (CRESTOR) tablet 40 mg  40 mg Oral q1800 Loralie Champagne, MD   40 mg at 07/30/11 1728  . sodium chloride 0.9 % injection 10 mL  10 mL Intracatheter PRN Sorin Laza      . spironolactone (ALDACTONE) tablet 25 mg  25 mg Oral Daily Jolaine Artist, MD      . DISCONTD: spironolactone (ALDACTONE) tablet 12.5 mg  12.5 mg Oral Daily Jolaine Artist, MD   12.5 mg at 07/30/11 [id number]    Allergies  Allergen Reactions  . Sulfa Antibiotics Anaphylaxis and Swelling   Principal Problem:  *CAD, multiple vessel Active Problems:  HTN (hypertension)  Hyperlipidemia  Venous stasis ulcers  DKA (diabetic ketoacidoses)  Depression  Systolic CHF, acute on chronic  DM type 1 causing renal disease  Hypoglycemia   Vital signs in last 24 hours: Temp:  [97.2 F (36.2 C)-97.9 F (36.6 C)] 97.2 F (36.2 C) (11/21 1200) Pulse Rate:  [68-74] 68  (11/21 1018) Resp:  [14-20] 16  (11/21 0400) BP: (96-117)/(41-60) 104/53 mmHg (11/21 0755) SpO2:  [94 %-100 %] 100 % (11/21 0800) Weight:  [85.186 kg (187 lb 12.8 oz)] 187 lb 12.8 oz (85.186 kg) (11/21 0600) Weight change: -5.614 kg (-12 lb 6 oz) Last BM Date: 07/28/11  Intake/Output from previous day: 11/20 0701 - 11/21 0700 In: 1204 [P.O.:870; I.V.:334] Out: 4226 [Urine:4225; Stool:1] Intake/Output this shift: Total I/O In: 20.1 [I.V.:20.1] Out: 550 [Urine:550]  Lab Results:  [facility] 07/31/11 0446 07/30/11 0620  WBC 7.1 6.9  HGB 14.2 14.6  HCT 41.9 42.8  PLT 177 170   BMET  Basename 07/31/11 1245 07/31/11 0446  NA 136 138  K 3.6 2.5*  CL 90* 92*  CO2 38* 37*  GLUCOSE 188* 68*  BUN 12 13  CREATININE 0.87 0.88  CALCIUM 8.0* 7.9*    Studies/Results: Mr Card Morphology Wo/w Cm  07/31/2011  Cardiac MRI:  Indication: Viability.  Severe 3VD with Cardiogenic Shock  Protocol:  The patient was scanned on a 1.5 Tesla GE magnet. Functional imaging was done using Fiesta sequences.  2,3 and 4 chamber views were done to assess RWMA;s.  Quantitative EF was calculated using mass analysis software on a [employer]. The patient received 20cc of multihance.  After 10 minutes inversion recovery sequences were done to assess for viability  Findings:  There were bilateral pleural effusions.  There was mild LAE.  RA/RV were normal.  There was severe LVE with diffuse hypokinesis and mid and basilar inferior wall akinesis and  thinning.  The quantitative EF was 20% ( EDV 245cc, ESV 198cc, and SV of only 47cc ).  The mid and basilar inferior wall had full thickness scar. Portions of the inferior septum had full thickness scar. The inferolateral wall had subendocardial scar. The anterior wall and apex where without scar.  Impression     1)    Severe LVE with diffuse hypokinesis and  mid/basilar inferior       wall akinesis EF 20%        2)    Likely nonviable mid and basilar inferior wall and inferoseptum.  Likely viable inferolateral wall. Likely hibernating anterior and anterior lateral wall (hypokinesis with no scar) 3)    Mild LAE 4)    Normal RA/RV 5)    Bilateral pleural effusions Original Report Authenticated [id number]    Medications: I have reviewed the patient's current medications.   Physical exam GENERAL- alert, well and happy HEAD- normal atraumatic, no neck masses, normal thyroid, no jvd RESPIRATORY- chest clear, no wheezing, crepitations, rhonchi, normal symmetric air entry CVS- regular rate and rhythm, S1, S2 normal, no murmur, click, rub or gallop ABDOMEN- abdomen is soft without significant tenderness, masses, organomegaly or guarding NEURO- Alert and oriented X 3, normal strength and tone. Normal symmetric reflexes. Normal coordination and gait EXTREMITIES- edema no drainage or discharge.  Plan   1. CAD, multiple vesselSystolic CHF, acute on chronic/- clinically improving. Diuresing. Appreciate cardiology/CTVS input. Continue current management. Replenish electrolytes as necessary.  2.  HTN (hypertension)- controlled  3. Hyperlipidemia- on crestor.  4. Venous stasis ulcers ?with cellulitis- on doxycycline. Seems improving.  5. Depression- stable.  6.  DM insulin dependent- generally controlled.  7. DVT/GI prophylaxis.patient's condition guarded.   Jhaden Pizzuto 07/31/2011 1:54 PM Pager: [phone].

## 2011-07-31 NOTE — Progress Notes (Signed)
Inpatient Diabetes Program Recommendations  AACE/ADA: New Consensus Statement on Inpatient Glycemic Control (2009)  Target Ranges:  Prepandial:   less than 140 mg/dL      Peak postprandial:   less than 180 mg/dL (1-2 hours)      Critically ill patients:  140 - 180 mg/dL   Reason for Visit: Hypoglycemia CBG=46 this morning  Inpatient Diabetes Program Recommendations Insulin - Basal: decrease Lantus to 15 units

## 2011-07-31 NOTE — Progress Notes (Signed)
Subjective:   60 yo female with DM2, obesity, NSTEMI found to have severe 3 vessel CAD with graftable vessels but currently with cardiogenic shock in the setting of reduced EF secondary to ICM, EF 25-30%.  Medical regimen being titrated to see if she can be an acceptable surgical candidate. RHC on 11/18 with PCWP 25 and CI 1.2  She feels much better today.  She is still on milrinone drip and Lasix. Urine output is very good. Renal function has actually improved. She had significant hypokalemia but was given 2 doses of potassium supplements. Repeat basic metabolic profile is pending.  BMET pending.  PICC line to be placed this am.   Intake/Output Summary (Last 24 hours) at 07/31/11 1213 Last data filed at 07/31/11 0800  Gross per 24 hour  Intake 984.07 ml  Output   2925 ml  Net -1940.93 ml    Current meds:    . aspirin EC  325 mg Oral Daily  . carvedilol  3.125 mg Oral BID WC  . digoxin  0.125 mg Oral Daily  . doxycycline  100 mg Oral Q12H  . enalapril  2.5 mg Oral BID WC  . enoxaparin (LOVENOX) injection  40 mg Subcutaneous Q24H  . escitalopram  20 mg Oral Daily  . furosemide  40 mg Intravenous BID  . gadobenate dimeglumine  20 mL Intravenous Once  .  HYDROmorphone (DILAUDID) injection  0.5 mg Intravenous Once  . insulin aspart  0-5 Units Subcutaneous QHS  . insulin aspart  0-9 Units Subcutaneous TID WC  . insulin glargine  20 Units Subcutaneous QHS  . levothyroxine  100 mcg Oral Daily  . pantoprazole  40 mg Oral Q1200  . potassium chloride  40 mEq Oral Once  . potassium chloride  40 mEq Oral Once  . rosuvastatin  40 mg Oral q1800  . spironolactone  25 mg Oral Daily  . DISCONTD: spironolactone  12.5 mg Oral Daily   Infusions:    . sodium chloride 13 mL/hr at 07/30/11 2012  . milrinone 0.25 mcg/kg/min (07/31/11 0800)     Objective:  Blood pressure 104/53, pulse 68, temperature 97.7 F (36.5 C), temperature source Oral, resp. rate 16, height 5\' 7"  (1.702 m), weight  85.186 kg (187 lb 12.8 oz), SpO2 100.00%. Weight change: -5.614 kg (-12 lb 6 oz)  Physical Exam: General:  Obese, NAD. No resp difficulty HEENT: normal Neck: supple. JVP 12-13 . Carotids 2+ bilat; no bruits. No lymphadenopathy or thryomegaly appreciated. Cor: PMI nondisplaced. Regular rate & rhythm. +S3, 2/6 TR. Lungs: clear Abdomen: obese, soft, nontender, nondistended. No hepatosplenomegaly. No bruits or masses. Good bowel sounds. Extremities: no cyanosis, clubbing, rash, tibial ulcers with dressings, SCDs in place, 2+ edema.  Neuro: alert & orientedx3, cranial nerves grossly intact. moves all 4 extremities w/o difficulty. Affect pleasant  Telemetry: SR 70-80  Lab Results: Basic Metabolic Panel:  Lab [id number] 07/30/11 0620 07/29/11 0725 07/28/11 0024 07/27/11 1201  NA 138 136 139 135 133*  K 2.5* 4.5 -- -- --  CL 92* 97 102 101 103  CO2 37* 27 25 23  17*  GLUCOSE 68* 130* 40* 107* 226*  BUN 13 16 15 20 19   CREATININE 0.88 1.25* 0.83 1.09 0.87  CALCIUM 7.9* 8.1* 8.5 8.4 6.9*  MG -- -- -- -- --  PHOS -- -- -- -- --   Liver Function Tests:  Lab 07/26/11 0020  AST 93*  ALT 30  ALKPHOS 146*  BILITOT 0.8  PROT 5.3*  ALBUMIN  2.6*   No results found for this basename: LIPASE:5,AMYLASE:5 in the last 168 hours No results found for this basename: AMMONIA:5 in the last 168 hours CBC:  Lab 07/31/11 0446 07/30/11 0620 07/29/11 0725 07/28/11 0620 07/27/11 0740 07/25/11 0152  WBC 7.1 6.9 7.2 7.1 6.9 --  NEUTROABS -- -- -- -- -- 12.9*  HGB 14.2 14.6 15.1* 15.2* 14.7 --  HCT 41.9 42.8 43.7 43.4 43.9 --  MCV 85.2 85.4 84.5 84.8 85.4 --  PLT 177 170 214 194 200 --   Cardiac Enzymes:  Lab 07/26/11 0900 07/26/11 0019  CKTOTAL 311* 383*  CKMB 20.3* 30.9*  CKMBINDEX -- --  TROPONINI 4.60* 6.58*   BNP: No results found for this basename: POCBNP:5 in the last 168 hours CBG:  Lab 07/31/11 1201 07/31/11 0848 07/31/11 0741 07/30/11 2225 07/30/11 1711  GLUCAP 166* 132* 46*  173* 173*   Microbiology: No results found for this basename: cult   No results found for this basename: CULT:2,SDES:2 in the last 168 hours  Imaging: No results found.   ASSESSMENT:  1) Cardiogenic shock       -- On Milrinone  2) Acute systolic HF secondary to ischemic CM, EF 25-30%  3) 3v-CAD, severe         --LAD 80% mid LCX 80% prox, OM1 95% RCA T prox with R->R & L-> R collats  4) Morbid obesity  5) DM2  6) Leg ulcers  7) Hyperlipidemia  8) Moderate to severe MR/TR   PLAN/DISCUSSION:  Diuresis much improved on milrinone and IV lasix. She is having very good urine output with this regimen. Renal function is stable. Potassium was replaced. The dose of Lasix infusion can likely be decreased starting tomorrow. Cardiac MRI was performed but the results are pending.    I reviewed her coronary angiography. The patient has typical diabetic vessels with diffuse disease and calcifications. I agree that coronary artery bypass graft surgery is the best option in terms of long-term patency. However, if she is deemed to be too high risk for surgery, and the disease in the left anterior descending artery and left circumflex can be approached percutaneously but will likely require multiple unknown drug-eluting stents. The right coronary artery is chronically occluded and is likely not approachable percutaneously.   LOS: 6 days    Kathlyn Sacramento, MD   07/31/2011, 12:13 PM

## 2011-07-31 NOTE — Progress Notes (Signed)
CRITICAL VALUE ALERT  Critical value received:  K+ 2.5  Date of notification:  07/31/2011  Time of notification:  [id number]  Critical value read back:yes  Nurse who received alert:  Warner Mccreedy  MD notified (1st page):  Radford Pax, T  Time of first page:  [phone]  MD notified (2nd page):  Time of second page:  Responding MD:  Ashok Norris  Time MD responded:  [phone]

## 2011-07-31 NOTE — Progress Notes (Signed)
Physical Therapy Cancellation Patient Details Name: Bridget Martinez MRN: [id number] DOB: [birth year] Today's Date: 07/31/2011  PT treatment cancelled for today secondary to hypokalemia.  Will continue to follow.  Thanks!!   Ruel Favors 07/31/2011, 1:30 PM

## 2011-08-01 ENCOUNTER — Inpatient Hospital Stay (HOSPITAL_COMMUNITY): Payer: BC Managed Care – PPO

## 2011-08-01 LAB — COMPREHENSIVE METABOLIC PANEL
ALT: 39 U/L — ABNORMAL HIGH (ref 0–35)
AST: 40 U/L — ABNORMAL HIGH (ref 0–37)
Albumin: 2.4 g/dL — ABNORMAL LOW (ref 3.5–5.2)
Alkaline Phosphatase: 289 U/L — ABNORMAL HIGH (ref 39–117)
Chloride: 93 mEq/L — ABNORMAL LOW (ref 96–112)
Creatinine, Ser: 1.03 mg/dL (ref 0.50–1.10)
Potassium: 3.7 mEq/L (ref 3.5–5.1)
Sodium: 139 mEq/L (ref 135–145)
Total Bilirubin: 0.9 mg/dL (ref 0.3–1.2)

## 2011-08-01 LAB — GLUCOSE, CAPILLARY
Glucose-Capillary: 239 mg/dL — ABNORMAL HIGH (ref 70–99)
Glucose-Capillary: 275 mg/dL — ABNORMAL HIGH (ref 70–99)
Glucose-Capillary: 350 mg/dL — ABNORMAL HIGH (ref 70–99)
Glucose-Capillary: 208 mg/dL — ABNORMAL HIGH (ref 70–99)
Glucose-Capillary: 252 mg/dL — ABNORMAL HIGH (ref 70–99)

## 2011-08-01 LAB — CBC
Platelets: 178 10*3/uL (ref 150–400)
RBC: 5 MIL/uL (ref 3.87–5.11)
RDW: 18.8 % — ABNORMAL HIGH (ref 11.5–15.5)
WBC: 6.6 10*3/uL (ref 4.0–10.5)

## 2011-08-01 LAB — PHOSPHORUS: Phosphorus: 2.2 mg/dL — ABNORMAL LOW (ref 2.3–4.6)

## 2011-08-01 LAB — PRO B NATRIURETIC PEPTIDE: Pro B Natriuretic peptide (BNP): 3039 pg/mL — ABNORMAL HIGH (ref 0–125)

## 2011-08-01 MED ORDER — INSULIN GLARGINE 100 UNIT/ML ~~LOC~~ SOLN: 12.0000 [IU] | Freq: Every day | SUBCUTANEOUS | Status: DC

## 2011-08-01 MED ORDER — DEXTROSE 50 % IV SOLN
INTRAVENOUS | Status: AC
  Administered 2011-08-01: 25 g via INTRAVENOUS
  Filled 2011-08-01: qty 50

## 2011-08-01 MED ORDER — FUROSEMIDE 40 MG PO TABS
40.0000 mg | ORAL_TABLET | Freq: Every day | ORAL | Status: DC
  Administered 2011-08-02 – 2011-08-05 (×4): 40 mg via ORAL
  Filled 2011-08-01 (×5): qty 1

## 2011-08-01 MED ORDER — POTASSIUM CHLORIDE CRYS ER 20 MEQ PO TBCR
20.0000 meq | EXTENDED_RELEASE_TABLET | Freq: Every day | ORAL | Status: DC
  Administered 2011-08-02 – 2011-08-05 (×4): 20 meq via ORAL
  Filled 2011-08-01 (×2): qty 1
  Filled 2011-08-01: qty 2
  Filled 2011-08-01 (×3): qty 1

## 2011-08-01 MED ORDER — MAGNESIUM SULFATE 40 MG/ML IJ SOLN
2.0000 g | Freq: Once | INTRAMUSCULAR | Status: AC
  Administered 2011-08-01: 2 g via INTRAVENOUS
  Filled 2011-08-01: qty 50

## 2011-08-01 MED ORDER — POTASSIUM & SODIUM PHOSPHATES 280-160-250 MG PO PACK
1.0000 | PACK | Freq: Three times a day (TID) | ORAL | Status: AC
  Administered 2011-08-01 (×3): 1 via ORAL
  Filled 2011-08-01 (×3): qty 1

## 2011-08-01 NOTE — Consult Note (Signed)
Subjective: Patient is confused this AM.  Glu went to 18.  Got 2 amps of D50.  Slowly becoming more alert. Denies SOB or CP. Objective: Filed Vitals:   07/31/11 1945 07/31/11 2000 07/31/11 2330 08/01/11 0400  BP: 92/42   91/48  Pulse:  73  82  Temp: 98.5 F (36.9 C) 97.8 F (36.6 C) 98.5 F (36.9 C) 98.7 F (37.1 C)  TempSrc: Axillary Oral Oral Oral  Resp: 20  20   Height:      Weight:    183 lb 1.6 oz (83.054 kg)  SpO2: 95%  93% 95%   Weight change: -4 lb 11.2 oz (-2.132 kg)  Intake/Output Summary (Last 24 hours) at 08/01/11 0830 Last data filed at 08/01/11 0600  Gross per 24 hour  Intake  982.2 ml  Output   1250 ml  Net -267.8 ml    General: Awake.  Answers with prompting.  Follows commands. HEENT: No bruits, no goiter.  Heart: Regular rate and rhythm, without murmurs, rubs, gallops.  Lungs:Relatively clear. Abdomen: Soft, nontender, nondistended, positive bowel sounds. Mild RUQ tnederness. Ext:  Legs wrapped.  1+ edema. Neuro: Grossly intact, nonfocal.   Lab Results: Results for orders placed during the hospital encounter of 07/25/11 (from the past 24 hour(s))  GLUCOSE, CAPILLARY     Status: Abnormal   Collection Time   07/31/11  8:48 AM      Component Value Range   Glucose-Capillary 132 (*) 70 - 99 (mg/dL)   Comment 1 Notify RN    GLUCOSE, CAPILLARY     Status: Abnormal   Collection Time   07/31/11 12:01 PM      Component Value Range   Glucose-Capillary 166 (*) 70 - 99 (mg/dL)   Comment 1 Notify RN    BASIC METABOLIC PANEL     Status: Abnormal   Collection Time   07/31/11 12:45 PM      Component Value Range   Sodium 136  135 - 145 (mEq/L)   Potassium 3.6  3.5 - 5.1 (mEq/L)   Chloride 90 (*) 96 - 112 (mEq/L)   CO2 38 (*) 19 - 32 (mEq/L)   Glucose, Bld 188 (*) 70 - 99 (mg/dL)   BUN 12  6 - 23 (mg/dL)   Creatinine, Ser 0.87  0.50 - 1.10 (mg/dL)   Calcium 8.0 (*) 8.4 - 10.5 (mg/dL)   GFR calc non Af Amer 70 (*) >90 (mL/min)   GFR calc Af Amer 82 (*)  >90 (mL/min)  GLUCOSE, CAPILLARY     Status: Abnormal   Collection Time   07/31/11  3:49 PM      Component Value Range   Glucose-Capillary 146 (*) 70 - 99 (mg/dL)   Comment 1 Notify RN    GLUCOSE, CAPILLARY     Status: Abnormal   Collection Time   07/31/11  9:25 PM      Component Value Range   Glucose-Capillary 239 (*) 70 - 99 (mg/dL)  CBC     Status: Abnormal   Collection Time   08/01/11  4:20 AM      Component Value Range   WBC 6.6  4.0 - 10.5 (K/uL)   RBC 5.00  3.87 - 5.11 (MIL/uL)   Hemoglobin 14.5  12.0 - 15.0 (g/dL)   HCT 42.7  36.0 - 46.0 (%)   MCV 85.4  78.0 - 100.0 (fL)   MCH 29.0  26.0 - 34.0 (pg)   MCHC 34.0  30.0 - 36.0 (g/dL)  RDW 18.8 (*) 11.5 - 15.5 (%)   Platelets 178  150 - 400 (K/uL)  COMPREHENSIVE METABOLIC PANEL     Status: Abnormal   Collection Time   08/01/11  4:20 AM      Component Value Range   Sodium 139  135 - 145 (mEq/L)   Potassium 3.7  3.5 - 5.1 (mEq/L)   Chloride 93 (*) 96 - 112 (mEq/L)   CO2 38 (*) 19 - 32 (mEq/L)   Glucose, Bld 78  70 - 99 (mg/dL)   BUN 14  6 - 23 (mg/dL)   Creatinine, Ser 1.03  0.50 - 1.10 (mg/dL)   Calcium 7.7 (*) 8.4 - 10.5 (mg/dL)   Total Protein 5.5 (*) 6.0 - 8.3 (g/dL)   Albumin 2.4 (*) 3.5 - 5.2 (g/dL)   AST 40 (*) 0 - 37 (U/L)   ALT 39 (*) 0 - 35 (U/L)   Alkaline Phosphatase 289 (*) 39 - 117 (U/L)   Total Bilirubin 0.9  0.3 - 1.2 (mg/dL)   GFR calc non Af Amer 57 (*) >90 (mL/min)   GFR calc Af Amer 67 (*) >90 (mL/min)  MAGNESIUM     Status: Abnormal   Collection Time   08/01/11  4:20 AM      Component Value Range   Magnesium 1.1 (*) 1.5 - 2.5 (mg/dL)  PHOSPHORUS     Status: Abnormal   Collection Time   08/01/11  4:20 AM      Component Value Range   Phosphorus 2.2 (*) 2.3 - 4.6 (mg/dL)  DIGOXIN LEVEL     Status: Abnormal   Collection Time   08/01/11  4:20 AM      Component Value Range   Digoxin Level 0.3 (*) 0.8 - 2.0 (ng/mL)    Studies/Results: Mr Card Morphology Wo/w Cm  07/31/2011  Cardiac  MRI:  Indication: Viability.  Severe 3VD with Cardiogenic Shock  Protocol:  The patient was scanned on a 1.5 Tesla GE magnet. Functional imaging was done using Fiest[employer] sequences.  2,3 and 4 chamber views were done to assess RWMA;s.  Quantitative EF was calculated using mass analysis software on a [place]. The patient received 20cc of multihance.  After 10 minutes inversion recovery sequences were done to assess for viability  Findings:  There were bilateral pleural effusions.  There was mild LAE.  RA/RV were normal.  There was severe LVE with diffuse hypokinesis and mid and basilar inferior wall akinesis and thinning.  The quantitative EF was 20% ( EDV 245cc, ESV 198cc, and SV of only 47cc ).  The mid and basilar inferior wall had full thickness scar. Portions of the inferior septum had full thickness scar. The inferolateral wall had subendocardial scar. The anterior wall and apex where without scar.  Impression     1)    Severe LVE with diffuse hypokinesis and mid/basilar inferior       wall akinesis EF 20%        2)    Likely nonviable mid and basilar inferior wall and inferoseptum.  Likely viable inferolateral wall. Likely hibernating anterior and anterior lateral wall (hypokinesis with no scar) 3)    Mild LAE 4)    Normal RA/RV 5)    Bilateral pleural effusions Original Report Authenticated [id number]    Medications: I have reviewed the patient's current medications.   Patient Active Hospital Problem List: CAD, multiple vessel (07/27/2011)   Assessment: Patient with severe CAD and LV dysfunction.  MRI yesterday shows  viability in the inferolateral, anterior, anterolateral walls but not the inferior wall (known occlusion). Diuresing with IV lasix and milrinone.   Plan: Continue meds for now.  WIll recheck BMET and BNP. Await further input form surgery.  HTN (hypertension) ()   Assessment: BP is a little low this AM>     Plan: Continue meds as bp allows. Hyperlipidemia ()   Assessment:      Plan: Continue statin. Venous stasis ulcers ()   Assessment:  Legs wrapped.   Plan: Dressing changs as scheduled. DKA (diabetic ketoacidoses) (07/25/2011)   Assessment: Glucose low this AM  Patient still a little slow    Plan:  Plan per primary team to adust insulin Depression (07/25/2011)   Assessment:    Plan:  Systolic CHF, acute on chronic (07/27/2011)   Assessment: As noted above.   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   Plan:    LOS: 7 days   Dorris Carnes 08/01/2011, 8:30 AM

## 2011-08-01 NOTE — Progress Notes (Signed)
Cardiac MRI and ab u/s reviewed remotely. Notes and labs reviewed. Discussed with care nurse by phone.  Has diuresed 25 pounds on milrinone. Hypotensive this am. BS 18. CVP now 6.  Will change lasix to po. Decrease kcl. Check co-ox in am, if O2 sat > 60% cut milrinone to 0.125. Attempt to titrate enalapril and coreg slowly as tolerated. Needs PT.   cMRI suggestive of significant viability with extensive hibernating myocardium. Will continue to try and optimize for possible CABG. May need short stay on CIR to really tune her up. Will consult them and get their thoughts. I think she probably can rehab safely in house.

## 2011-08-01 NOTE — Progress Notes (Signed)
CBG:18  Treatment: D50 IV 50 mL  Symptoms: Sweaty, unresponsive  Follow-up CBG: [id number] CBG Result:350  Possible Reasons for Event: Medication regimen   Comments/MD notified:MD at bedside    Trish Fountain English

## 2011-08-01 NOTE — Progress Notes (Signed)
SUBJECTIVE Cardiology appreciated. Patient barely responsive at time of encounter, improved after 2 amps of D50W. BS 18 mg/dl. She is reorienting impressively.   1. DKA (diabetic ketoacidoses)   2. HTN (hypertension)   3. Hyperlipidemia   4. Venous stasis ulcers   5. Q waves suggestive of previous myocardial infarction   6. CAD, multiple vessel   7. Systolic CHF, acute on chronic     Past Medical History  Diagnosis Date  . Diabetes mellitus     on insulin, with h/o DKA   . HTN (hypertension)   . Hyperlipidemia   . Venous stasis ulcers   . Q waves suggestive of previous myocardial infarction     Pt denies h/o AMI, cath, PCI  . Angina   . Tuberculosis   . Hyperthyroidism    Current Facility-Administered Medications  Medication Dose Route Frequency Provider Last Rate Last Dose  . 0.9 %  sodium chloride infusion   Intravenous Continuous Jolaine Artist, MD 13 mL/hr at 07/31/11 2325    . acetaminophen (TYLENOL) tablet 650 mg  650 mg Oral Q4H PRN Loralie Champagne, MD   650 mg at 07/29/11 1802  . aspirin EC tablet 325 mg  325 mg Oral Daily Lonn Georgia, PA   325 mg at 07/31/11 1018  . carvedilol (COREG) tablet 3.125 mg  3.125 mg Oral BID WC Loralie Champagne, MD   3.125 mg at 07/31/11 1722  . dextrose 50 % solution        25 g at 08/01/11 0810  . dextrose 50 % solution        25 g at 08/01/11 0805  . digoxin (LANOXIN) tablet 0.125 mg  0.125 mg Oral Daily Wynetta Emery, PA   0.125 mg at 07/31/11 1018  . doxycycline (VIBRA-TABS) tablet 100 mg  100 mg Oral Q12H Sorin Laza   100 mg at 08/01/11 0107  . enalapril (VASOTEC) tablet 2.5 mg  2.5 mg Oral BID WC Wynetta Emery, PA   2.5 mg at 07/31/11 1725  . enoxaparin (LOVENOX) injection 40 mg  40 mg Subcutaneous Q24H Nadine Counts, PHARMD   40 mg at 07/31/11 1019  . escitalopram (LEXAPRO) tablet 20 mg  20 mg Oral Daily Nadine Counts, PHARMD   20 mg at 07/31/11 1018  . furosemide (LASIX) injection 40 mg  40 mg Intravenous BID  Jolaine Artist, MD   40 mg at 07/31/11 1723  . insulin aspart (novoLOG) injection 0-9 Units  0-9 Units Subcutaneous TID WC Saima Rizwan   1 Units at 07/31/11 1723  . insulin glargine (LANTUS) injection 12 Units  12 Units Subcutaneous QHS Delontae Lamm      . levothyroxine (SYNTHROID, LEVOTHROID) tablet 100 mcg  100 mcg Oral Daily Ria Bush, MD   100 mcg at 07/31/11 1018  . magnesium sulfate IVPB 2 g 50 mL  2 g Intravenous Once Lynita Groseclose      . milrinone [facility]) infusion 200 mcg/mL (0.2 mg/mL)  0.25 mcg/kg/min Intravenous Continuous Jolaine Artist, MD 7.1 mL/hr at 07/31/11 1913 0.25 mcg/kg/min at 07/31/11 1913  . ondansetron (ZOFRAN) injection 4 mg  4 mg Intravenous Q6H PRN Loralie Champagne, MD   4 mg at 07/31/11 1036  . pantoprazole (PROTONIX) EC tablet 40 mg  40 mg Oral Q1200 Sorin Laza   40 mg at 07/31/11 1227  . potassium & sodium phosphates (PHOS-NAK) 280-160-250 MG packet 1 packet  1 packet Oral TID WC & HS Jannelle Notaro      .  potassium chloride 20 MEQ/15ML (10%) liquid 40 mEq  40 mEq Oral Once Osman Calzadilla   40 mEq at 07/31/11 1515  . potassium chloride SA (K-DUR,KLOR-CON) CR tablet 20 mEq  20 mEq Oral BID Wellington Hampshire, MD   20 mEq at 07/31/11 1849  . potassium chloride SA (K-DUR,KLOR-CON) CR tablet 40 mEq  40 mEq Oral Once Sueanne Margarita, MD   40 mEq at 07/31/11 1018  . rosuvastatin (CRESTOR) tablet 40 mg  40 mg Oral q1800 Loralie Champagne, MD   40 mg at 07/31/11 1723  . sodium chloride 0.9 % injection 10 mL  10 mL Intracatheter PRN Sorin Laza      . spironolactone (ALDACTONE) tablet 25 mg  25 mg Oral Daily Jolaine Artist, MD      . DISCONTD: HYDROmorphone (DILAUDID) injection 0.5 mg  0.5 mg Intravenous Once Dianne Dun      . DISCONTD: insulin aspart (novoLOG) injection 0-5 Units  0-5 Units Subcutaneous QHS Saima Rizwan   2 Units at 07/31/11 2332  . DISCONTD: insulin glargine (LANTUS) injection 20 Units  20 Units Subcutaneous QHS Sorin Laza   20 Units at  07/31/11 2328  . DISCONTD: spironolactone (ALDACTONE) tablet 12.5 mg  12.5 mg Oral Daily Jolaine Artist, MD   12.5 mg at 07/30/11 [id number]   Allergies  Allergen Reactions  . Sulfa Antibiotics Anaphylaxis and Swelling   Principal Problem:  *CAD, multiple vessel Active Problems:  HTN (hypertension)  Hyperlipidemia  Venous stasis ulcers  DKA (diabetic ketoacidoses)  Depression  Systolic CHF, acute on chronic  DM type 1 causing renal disease  Hypoglycemia   Vital signs in last 24 hours: Temp:  [97.2 F (36.2 C)-98.7 F (37.1 C)] 97.7 F (36.5 C) (11/22 0819) Pulse Rate:  [68-82] 77  (11/22 0830) Resp:  [18-20] 20  (11/21 2330) BP: (83-109)/(35-53) 89/39 mmHg (11/22 0832) SpO2:  [93 %-100 %] 100 % (11/22 0830) Weight:  [83.054 kg (183 lb 1.6 oz)] 183 lb 1.6 oz (83.054 kg) (11/22 0400) Weight change: -2.132 kg (-4 lb 11.2 oz) Last BM Date: 07/28/11  Intake/Output from previous day: 11/21 0701 - 11/22 0700 In: 1002.3 [P.O.:540; I.V.:462.3] Out: 1800 [Urine:1800] Intake/Output this shift:    Lab Results:  Basename 08/01/11 0420 07/31/11 0446  WBC 6.6 7.1  HGB 14.5 14.2  HCT 42.7 41.9  PLT 178 177   BMET  Basename 08/01/11 0420 07/31/11 1245  NA 139 136  K 3.7 3.6  CL 93* 90*  CO2 38* 38*  GLUCOSE 78 188*  BUN 14 12  CREATININE 1.03 0.87  CALCIUM 7.7* 8.0*    Studies/Results: Mr Card Morphology Wo/w Cm  07/31/2011  Cardiac MRI:  Indication: Viability.  Severe 3VD with Cardiogenic Shock  Protocol:  The patient was scanned on a 1.5 Tesla GE magnet. Functional imaging was done using [employer]iest[employer] sequences.  2,3 and 4 chamber views were done to assess RWMA;s.  Quantitative EF was calculated using mass analysis software on a [place]. The patient received 20cc of multihance.  After 10 minutes inversion recovery sequences were done to assess for viability  Findings:  There were bilateral pleural effusions.  There was mild LAE.  RA/RV were normal.  There was  severe LVE with diffuse hypokinesis and mid and basilar inferior wall akinesis and thinning.  The quantitative EF was 20% ( EDV 245cc, ESV 198cc, and SV of only 47cc ).  The mid and basilar inferior wall had full thickness scar. Portions  of the inferior septum had full thickness scar. The inferolateral wall had subendocardial scar. The anterior wall and apex where without scar.  Impression     1)    Severe LVE with diffuse hypokinesis and mid/basilar inferior       wall akinesis EF 20%        2)    Likely nonviable mid and basilar inferior wall and inferoseptum.  Likely viable inferolateral wall. Likely hibernating anterior and anterior lateral wall (hypokinesis with no scar) 3)    Mild LAE 4)    Normal RA/RV 5)    Bilateral pleural effusions Original Report Authenticated [id number]    Medications: I have reviewed the patient's current medications.   Physical exam GENERAL- alert and well HEAD- normal atraumatic, no neck masses, normal thyroid, no jvd RESPIRATORY- chest clear, no wheezing, crepitations, rhonchi, normal symmetric air entry CVS- regular rate and rhythm, S1, S2 normal, no murmur, click, rub or gallop ABDOMEN- abdomen is soft without significant tenderness, masses, organomegaly or guarding NEURO- awake, some disorientation expected with hypoglycemia. EXTREMITIES- no drainage from both legs.  Plan 1. CAD, multiple vesselSystolic CHF, acute on chronic/- stable. Diuresing. Appreciate cardiology/CTVS input. Continue current management per cardiology. ?CABG next week.  2. HTN (hypertension)- controlled  3. Hyperlipidemia- on crestor.  4. Venous stasis ulcers ?with cellulitis- on doxycycline. Seems improving.  5. Depression- stable.  6. DM insulin dependent- episodes of hypoglycemia, element of . Decrease lantus to 12 units sq qhs, d/c night coverage.  7. Hypomagnesemia/hypophosphatemia related to diuresis/poor oral intake- replenish. 8. Transaminitis- ? Hepatic congestion. Monitor.  Abdominal ultrasound. 9. DVT/GI prophylaxis.patient's condition guarded.     Tanai Bouler 08/01/2011 8:56 AM Pager: [phone].

## 2011-08-02 DIAGNOSIS — I5023 Acute on chronic systolic (congestive) heart failure: Secondary | ICD-10-CM

## 2011-08-02 LAB — GLUCOSE, CAPILLARY
Glucose-Capillary: 266 mg/dL — ABNORMAL HIGH (ref 70–99)
Glucose-Capillary: 214 mg/dL — ABNORMAL HIGH (ref 70–99)
Glucose-Capillary: 215 mg/dL — ABNORMAL HIGH (ref 70–99)
Glucose-Capillary: 173 mg/dL — ABNORMAL HIGH (ref 70–99)

## 2011-08-02 LAB — COMPREHENSIVE METABOLIC PANEL
Albumin: 2.4 g/dL — ABNORMAL LOW (ref 3.5–5.2)
BUN: 13 mg/dL (ref 6–23)
Calcium: 7.9 mg/dL — ABNORMAL LOW (ref 8.4–10.5)
GFR calc Af Amer: 65 mL/min — ABNORMAL LOW (ref >90)
Glucose, Bld: 243 mg/dL — ABNORMAL HIGH (ref 70–99)
Potassium: 3.9 mEq/L (ref 3.5–5.1)
Sodium: 135 mEq/L (ref 135–145)
Total Protein: 5.4 g/dL — ABNORMAL LOW (ref 6.0–8.3)

## 2011-08-02 LAB — CBC
HCT: 42 % (ref 36.0–46.0)
Hemoglobin: 14.1 g/dL (ref 12.0–15.0)
MCH: 28.9 pg (ref 26.0–34.0)
MCHC: 33.6 g/dL (ref 30.0–36.0)
RDW: 18.8 % — ABNORMAL HIGH (ref 11.5–15.5)

## 2011-08-02 LAB — CARBOXYHEMOGLOBIN
Carboxyhemoglobin: 1.4 % (ref 0.5–1.5)
O2 Saturation: 80 %
Total hemoglobin: 14 g/dL (ref 12.5–16.0)

## 2011-08-02 LAB — MAGNESIUM: Magnesium: 1.6 mg/dL (ref 1.5–2.5)

## 2011-08-02 MED ORDER — MILRINONE IN DEXTROSE 200-5 MCG/ML-% IV SOLN
0.1250 ug/kg/min | INTRAVENOUS | Status: DC
  Administered 2011-08-02 – 2011-08-05 (×3): 0.125 ug/kg/min via INTRAVENOUS
  Filled 2011-08-02 (×4): qty 100

## 2011-08-02 MED ORDER — INSULIN GLARGINE 100 UNIT/ML ~~LOC~~ SOLN
10.0000 [IU] | Freq: Every day | SUBCUTANEOUS | Status: DC
  Administered 2011-08-02 – 2011-08-03 (×2): 10 [IU] via SUBCUTANEOUS

## 2011-08-02 MED ORDER — MAGNESIUM SULFATE 40 MG/ML IJ SOLN
2.0000 g | Freq: Once | INTRAMUSCULAR | Status: AC
  Administered 2011-08-02: 2 g via INTRAVENOUS
  Filled 2011-08-02 (×2): qty 50

## 2011-08-02 NOTE — Progress Notes (Signed)
SUBJECTIVE Feels well. No complaints. Scared about sugars going low in the night.   1. DKA (diabetic ketoacidoses)   2. HTN (hypertension)   3. Hyperlipidemia   4. Venous stasis ulcers   5. Q waves suggestive of previous myocardial infarction   6. CAD, multiple vessel   7. Systolic CHF, acute on chronic     Past Medical History  Diagnosis Date  . Diabetes mellitus     on insulin, with h/o DKA   . HTN (hypertension)   . Hyperlipidemia   . Venous stasis ulcers   . Q waves suggestive of previous myocardial infarction     Pt denies h/o AMI, cath, PCI  . Angina   . Tuberculosis   . Hyperthyroidism    Current Facility-Administered Medications  Medication Dose Route Frequency Provider Last Rate Last Dose  . 0.9 %  sodium chloride infusion   Intravenous Continuous Jolaine Artist, MD 13 mL/hr at 08/01/11 2009    . acetaminophen (TYLENOL) tablet 650 mg  650 mg Oral Q4H PRN Loralie Champagne, MD   650 mg at 07/29/11 1802  . aspirin EC tablet 325 mg  325 mg Oral Daily Lonn Georgia, PA   325 mg at 08/01/11 1003  . carvedilol (COREG) tablet 3.125 mg  3.125 mg Oral BID WC Loralie Champagne, MD   3.125 mg at 08/02/11 0829  . digoxin (LANOXIN) tablet 0.125 mg  0.125 mg Oral Daily Wynetta Emery, PA   0.125 mg at 08/01/11 1005  . doxycycline (VIBRA-TABS) tablet 100 mg  100 mg Oral Q12H Sorin Laza   100 mg at 08/01/11 2237  . enalapril (VASOTEC) tablet 2.5 mg  2.5 mg Oral BID WC Wynetta Emery, PA   2.5 mg at 08/02/11 0830  . enoxaparin (LOVENOX) injection 40 mg  40 mg Subcutaneous Q24H Nadine Counts, PHARMD   40 mg at 08/01/11 1006  . escitalopram (LEXAPRO) tablet 20 mg  20 mg Oral Daily Nadine Counts, PHARMD   20 mg at 08/01/11 1006  . furosemide (LASIX) tablet 40 mg  40 mg Oral Daily Shaune Pascal [name], MD      . insulin aspart (novoLOG) injection 0-9 Units  0-9 Units Subcutaneous TID WC Saima Rizwan   3 Units at 08/02/11 0831  . insulin glargine (LANTUS) injection 10 Units  10  Units Subcutaneous QHS Elvyn Krohn      . levothyroxine (SYNTHROID, LEVOTHROID) tablet 100 mcg  100 mcg Oral Daily Ria Bush, MD   100 mcg at 08/01/11 1007  . magnesium sulfate IVPB 2 g 50 mL  2 g Intravenous Once Emanii Bugbee   2 g at 08/01/11 1008  . magnesium sulfate IVPB 2 g 50 mL  2 g Intravenous Once Satoya Feeley   2 g at 08/02/11 0838  . milrinone (PRIMACOR) infusion 200 mcg/mL (0.2 mg/mL)  0.125 mcg/kg/min Intravenous Continuous Fay Records, MD      . ondansetron [employer]) injection 4 mg  4 mg Intravenous Q6H PRN Loralie Champagne, MD   4 mg at 07/31/11 1036  . pantoprazole (PROTONIX) EC tablet 40 mg  40 mg Oral Q1200 Sorin Laza   40 mg at 08/01/11 1314  . potassium & sodium phosphates (PHOS-NAK) 280-160-250 MG packet 1 packet  1 packet Oral TID WC & HS [place] Baldwin   1 packet at 08/01/11 2236  . potassium chloride SA (K-DUR,KLOR-CON) CR tablet 20 mEq  20 mEq Oral Daily Jolaine Artist, MD      .  rosuvastatin (CRESTOR) tablet 40 mg  40 mg Oral q1800 Loralie Champagne, MD   40 mg at 08/01/11 1727  . sodium chloride 0.9 % injection 10 mL  10 mL Intracatheter PRN Sorin Laza      . spironolactone (ALDACTONE) tablet 25 mg  25 mg Oral Daily Jolaine Artist, MD   25 mg at 08/01/11 1009  . DISCONTD: furosemide (LASIX) injection 40 mg  40 mg Intravenous BID Jolaine Artist, MD   40 mg at 08/01/11 1727  . DISCONTD: insulin glargine (LANTUS) injection 12 Units  12 Units Subcutaneous QHS Labaron Digirolamo      . DISCONTD: milrinone (PRIMACOR) infusion 200 mcg/mL (0.2 mg/mL)  0.25 mcg/kg/min Intravenous Continuous Shaune Pascal [name], MD 3.5 mL/hr at 08/02/11 0800 0.125 mcg/kg/min at 08/02/11 0800  . DISCONTD: potassium chloride SA (K-DUR,KLOR-CON) CR tablet 20 mEq  20 mEq Oral BID Wellington Hampshire, MD   20 mEq at 07/31/11 1849   Allergies  Allergen Reactions  . Sulfa Antibiotics Anaphylaxis and Swelling   Principal Problem:  *CAD, multiple vessel Active Problems:  HTN (hypertension)   Hyperlipidemia  Venous stasis ulcers  DKA (diabetic ketoacidoses)  Depression  Systolic CHF, acute on chronic  DM type 1 causing renal disease  Hypoglycemia   Vital signs in last 24 hours: Temp:  [97.7 F (36.5 C)-98.9 F (37.2 C)] 97.8 F (36.6 C) (11/23 0750) Pulse Rate:  [75-85] 81  (11/23 0829) Resp:  [16-20] 16  (11/23 0600) BP: (82-113)/(40-55) 100/42 mmHg (11/23 0829) SpO2:  [88 %-100 %] 94 % (11/23 0750) Weight:  [82.2 kg (181 lb 3.5 oz)] 181 lb 3.5 oz (82.2 kg) (11/23 0330) Weight change: -0.854 kg (-1 lb 14.1 oz) Last BM Date: 07/31/11  Intake/Output from previous day: 11/22 0701 - 11/23 0700 In: 1819.3 [P.O.:1320; I.V.:449.3; IV Piggyback:50] Out: 2875 [Urine:2875] Intake/Output this shift: Total I/O In: 247 [P.O.:240; I.V.:7] Out: -   Lab Results:  Basename 08/02/11 0446 08/01/11 0420  WBC 6.3 6.6  HGB 14.1 14.5  HCT 42.0 42.7  PLT 180 178   BMET  Basename 08/02/11 0446 08/01/11 0420  NA 135 139  K 3.9 3.7  CL 90* 93*  CO2 36* 38*  GLUCOSE 243* 78  BUN 13 14  CREATININE 1.05 1.03  CALCIUM 7.9* 7.7*    Studies/Results: US Abdomen Complete  08/01/2011  *RADIOLOGY REPORT*  Clinical Data:  Elevated liver function tests. Nausea and vomiting.  COMPLETE ABDOMINAL ULTRASOUND  Comparison:  Abdominal ultrasound 07/25/2011.  Findings:  Gallbladder:  No gallstones, gallbladder wall thickening, or pericholecystic fluid.  Common bile duct:  Measures 0.3 cm.  Liver:  No focal lesion identified.  Within normal limits in parenchymal echogenicity.  IVC:  Appears normal.  Pancreas:  No focal abnormality seen.  Spleen:  Measures 5.1 cm and appears normal.  Right Kidney:  Measures 11.1 cm.  1.2 cm simple cyst in the upper pole noted.  No stones or hydronephrosis.  Left Kidney:  Measures 10.7 cm and appears normal.  Abdominal aorta:  No aneurysm identified.  IMPRESSION: No acute finding.  Normal appearing liver and pancreas.  Small right renal cyst again seen.  Original  Report Authenticated By: Arvid Right. Luther Parody, M.D.    Medications: I have reviewed the patient's current medications.   Physical exam GENERAL- alert, well and happy HEAD- normal atraumatic, no neck masses, normal thyroid, no jvd RESPIRATORY- chest clear, no wheezing, crepitations, rhonchi, normal symmetric air entry CVS- regular rate and rhythm, S1, S2 normal,  no murmur, click, rub or gallop ABDOMEN- abdomen is soft without significant tenderness, masses, organomegaly or guarding NEURO- Grossly normal EXTREMITIES- extremities normal, atraumatic, no cyanosis or edema  Plan 1. CAD, multiple vesselSystolic CHF, acute on chronic/- stable. Diuresing. Appreciate cardiology/CTVS input. Continue current management per cardiology.   2. HTN (hypertension)- controlled  3. Hyperlipidemia- on crestor.  4. Venous stasis ulcers ?with cellulitis- on doxycycline. Seems improving.  5. Depression- stable.  6. DM insulin dependent- Blood sugar better. Decrease lantus to 8 units sq qhs, and increase upwards as necessary. 7. Hypomagnesemia/hypophosphatemia related to diuresis/poor oral intake- replenish.  8. Transaminitis- ? Hepatic congestion. Monitor. Abdominal ultrasound normal. Watch levels since on statins. 9. DVT/GI prophylaxis.patient's condition remains guarded     Hajime Asfaw 08/02/2011 10:01 AM Pager: [phone].

## 2011-08-02 NOTE — Consult Note (Signed)
Subjective: Patient denies SOB, no CP.  Sitting in chair, makeup on.  Alert. Objective: Filed Vitals:   08/01/11 2336 08/02/11 0330 08/02/11 0600 08/02/11 0750  BP: 106/41 113/49  100/42  Pulse:      Temp: 98.5 F (36.9 C) 98.3 F (36.8 C)  97.8 F (36.6 C)  TempSrc: Oral Oral  Oral  Resp: 16 16 16    Height:      Weight:  181 lb 3.5 oz (82.2 kg)    SpO2: 94% 92%  94%   Weight change: -1 lb 14.1 oz (-0.854 kg)  Intake/Output Summary (Last 24 hours) at 08/02/11 0806 Last data filed at 08/02/11 0800  Gross per 24 hour  Intake 1802.7 ml  Output   2875 ml  Net -1072.3 ml    General: Alert, awake, oriented x3, in no acute distress.  HEENT: No bruits, no goiter.  Heart: Regular rate and rhythm, without murmurs  S1, S2, soft S3. Lungs: Fine crackles left side, bilateral air movement.  Abdomen: Soft, nontender, nondistended, positive bowel sounds.  Neuro: Grossly intact, nonfocal. Ext:  Triv edema  Wrapped.  Improved.  Lab Results: Results for orders placed during the hospital encounter of 07/25/11 (from the past 24 hour(s))  GLUCOSE, CAPILLARY     Status: Abnormal   Collection Time   08/01/11  8:09 AM      Component Value Range   Glucose-Capillary 350 (*) 70 - 99 (mg/dL)  GLUCOSE, CAPILLARY     Status: Abnormal   Collection Time   08/01/11  8:19 AM      Component Value Range   Glucose-Capillary 275 (*) 70 - 99 (mg/dL)  GLUCOSE, CAPILLARY     Status: Abnormal   Collection Time   08/01/11 11:32 AM      Component Value Range   Glucose-Capillary 275 (*) 70 - 99 (mg/dL)  PRO B NATRIURETIC PEPTIDE     Status: Abnormal   Collection Time   08/01/11  3:13 PM      Component Value Range   BNP, POC 3039.0 (*) 0 - 125 (pg/mL)  GLUCOSE, CAPILLARY     Status: Abnormal   Collection Time   08/01/11  3:18 PM      Component Value Range   Glucose-Capillary 208 (*) 70 - 99 (mg/dL)  GLUCOSE, CAPILLARY     Status: Abnormal   Collection Time   08/01/11  4:37 PM      Component Value  Range   Glucose-Capillary 252 (*) 70 - 99 (mg/dL)  GLUCOSE, CAPILLARY     Status: Normal   Collection Time   08/01/11  9:24 PM      Component Value Range   Glucose-Capillary 78  70 - 99 (mg/dL)   Comment 1 Documented in Chart     Comment 2 Notify RN    GLUCOSE, CAPILLARY     Status: Abnormal   Collection Time   08/01/11 11:40 PM      Component Value Range   Glucose-Capillary 266 (*) 70 - 99 (mg/dL)   Comment 1 Notify RN     Comment 2 Documented in Chart    GLUCOSE, CAPILLARY     Status: Abnormal   Collection Time   08/02/11  3:33 AM      Component Value Range   Glucose-Capillary 266 (*) 70 - 99 (mg/dL)   Comment 1 Notify RN     Comment 2 Documented in Chart    CARBOXYHEMOGLOBIN     Status: Normal   Collection Time  08/02/11  4:45 AM      Component Value Range   Total hemoglobin 14.0  12.5 - 16.0 (g/dL)   O2 Saturation 80.0     Carboxyhemoglobin 1.4  0.5 - 1.5 (%)   Methemoglobin 0.9  0.0 - 1.5 (%)  CBC     Status: Abnormal   Collection Time   08/02/11  4:46 AM      Component Value Range   WBC 6.3  4.0 - 10.5 (K/uL)   RBC 4.88  3.87 - 5.11 (MIL/uL)   Hemoglobin 14.1  12.0 - 15.0 (g/dL)   HCT 42.0  36.0 - 46.0 (%)   MCV 86.1  78.0 - 100.0 (fL)   MCH 28.9  26.0 - 34.0 (pg)   MCHC 33.6  30.0 - 36.0 (g/dL)   RDW 18.8 (*) 11.5 - 15.5 (%)   Platelets 180  150 - 400 (K/uL)  COMPREHENSIVE METABOLIC PANEL     Status: Abnormal   Collection Time   08/02/11  4:46 AM      Component Value Range   Sodium 135  135 - 145 (mEq/L)   Potassium 3.9  3.5 - 5.1 (mEq/L)   Chloride 90 (*) 96 - 112 (mEq/L)   CO2 36 (*) 19 - 32 (mEq/L)   Glucose, Bld 243 (*) 70 - 99 (mg/dL)   BUN 13  6 - 23 (mg/dL)   Creatinine, Ser 1.05  0.50 - 1.10 (mg/dL)   Calcium 7.9 (*) 8.4 - 10.5 (mg/dL)   Total Protein 5.4 (*) 6.0 - 8.3 (g/dL)   Albumin 2.4 (*) 3.5 - 5.2 (g/dL)   AST 42 (*) 0 - 37 (U/L)   ALT 35  0 - 35 (U/L)   Alkaline Phosphatase 273 (*) 39 - 117 (U/L)   Total Bilirubin 0.9  0.3 - 1.2  (mg/dL)   GFR calc non Af Amer 56 (*) >90 (mL/min)   GFR calc Af Amer 65 (*) >90 (mL/min)  MAGNESIUM     Status: Normal   Collection Time   08/02/11  4:46 AM      Component Value Range   Magnesium 1.6  1.5 - 2.5 (mg/dL)  PHOSPHORUS     Status: Normal   Collection Time   08/02/11  4:46 AM      Component Value Range   Phosphorus 3.3  2.3 - 4.6 (mg/dL)    Studies/Results: US Abdomen Complete  08/01/2011  *RADIOLOGY REPORT*  Clinical Data:  Elevated liver function tests. Nausea and vomiting.  COMPLETE ABDOMINAL ULTRASOUND  Comparison:  Abdominal ultrasound 07/25/2011.  Findings:  Gallbladder:  No gallstones, gallbladder wall thickening, or pericholecystic fluid.  Common bile duct:  Measures 0.3 cm.  Liver:  No focal lesion identified.  Within normal limits in parenchymal echogenicity.  IVC:  Appears normal.  Pancreas:  No focal abnormality seen.  Spleen:  Measures 5.1 cm and appears normal.  Right Kidney:  Measures 11.1 cm.  1.2 cm simple cyst in the upper pole noted.  No stones or hydronephrosis.  Left Kidney:  Measures 10.7 cm and appears normal.  Abdominal aorta:  No aneurysm identified.  IMPRESSION: No acute finding.  Normal appearing liver and pancreas.  Small right renal cyst again seen.  Original Report Authenticated By: Arvid Right. Luther Parody, M.D.    Medications: I have reviewed the patient's current medications.   Patient Active Hospital Problem List: CAD, multiple vessel (07/27/2011)   Assessment:  Diffuse CAD.  MRI with extensive viability with hibernating myocardium.  Surgery to see.  HTN (hypertension) ()   Assessment: BP is marginal at times.  I am reluctant for now to recomm increasing agints.  Follow meds today.  If remains greater than [id number] systolic will titrate.    Hyperlipidemia ()   Assessment:   Plan: Continue crestor. Venous stasis ulcers ()   Assessment: Wrapped.   Plan: DKA (diabetic ketoacidoses) (07/25/2011)   Assessment: Glucose remains labile but not as  low as yesterday.   Plan: IM adddressing. Depression (07/25/2011)   Assessment:   Plan: Systolic CHF, acute on chronic (07/27/2011)   Assessment: Coox at 80.  Appreciate input of D. [name].  WIll decrease milrinone to 0.125.  Cut back on lasix to PO  FOllow I/Os closely.   Plan: DM type 1 causing renal disease (07/27/2011)   Assessment:    Plan: Hypoglycemia (07/29/2011)    LOS: 8 days   Dorris Carnes 08/02/2011, 8:06 AM

## 2011-08-02 NOTE — Progress Notes (Signed)
Physical Therapy Treatment Patient Details Name: Bridget Martinez MRN: [id number] DOB: 02-22-1951 Today's Date: 08/02/2011  PT Assessment/Plan  PT - Assessment/Plan Comments on Treatment Session: pt incontinent of uring during session and demo'd decreased balance when distracted by urine. PT Plan: Discharge plan remains appropriate PT Frequency: Min 3X/week Follow Up Recommendations: Home health PT Equipment Recommended: Rolling walker with 5" wheels PT Goals  Acute Rehab PT Goals PT Goal: Supine/Side to Sit - Progress: Not met PT Goal: Sit to Supine/Side - Progress: Not met PT Transfer Goal: Bed to Chair/Chair to Bed - Progress: Progressing toward goal PT Goal: Ambulate - Progress: Progressing toward goal PT Goal: Up/Down Stairs - Progress: Not met  PT Treatment Precautions/Restrictions  Precautions Precautions: Fall Required Braces or Orthoses: No Restrictions Weight Bearing Restrictions: No Mobility (including Balance) Bed Mobility Bed Mobility: No Transfers Transfers: Yes Sit to Stand: 4: Min assist;With upper extremity assist;From chair/3-in-1;From toilet (grab bar used from toilet) Sit to Stand Details (indicate cue type and reason): cues for UE use and to get closer prior to sitting Stand to Sit: 4: Min assist;With upper extremity assist;To chair/3-in-1;To toilet Stand to Sit Details: cues to get closer prior to sitting Ambulation/Gait Ambulation/Gait: Yes Ambulation/Gait Assistance: 4: Min assist Ambulation/Gait Assistance Details (indicate cue type and reason): A for balance.  pt staggered gait worse with fatigue.  pt incontinent of urine in hall and had to sit in desk chair to be rolled to bathroom Ambulation Distance (Feet): 175 Feet Assistive device: None Gait Pattern: Ataxic;Shuffle (Try RW next time) Stairs: No    Exercise    End of Session PT - End of Session Equipment Utilized During Treatment: Gait belt Activity Tolerance: Patient limited by  fatigue Patient left: in chair;with call bell in reach;with family/visitor present General Behavior During Session: Flat affect Cognition: [facility] for tasks performed  Catarina Hartshorn, [place] 08/02/2011, 2:39 PM

## 2011-08-03 LAB — BASIC METABOLIC PANEL
CO2: 32 mEq/L (ref 19–32)
Chloride: 95 mEq/L — ABNORMAL LOW (ref 96–112)
Creatinine, Ser: 1.04 mg/dL (ref 0.50–1.10)
Glucose, Bld: 170 mg/dL — ABNORMAL HIGH (ref 70–99)

## 2011-08-03 LAB — PRO B NATRIURETIC PEPTIDE: Pro B Natriuretic peptide (BNP): 2811 pg/mL — ABNORMAL HIGH (ref 0–125)

## 2011-08-03 LAB — GLUCOSE, CAPILLARY
Glucose-Capillary: 142 mg/dL — ABNORMAL HIGH (ref 70–99)
Glucose-Capillary: 268 mg/dL — ABNORMAL HIGH (ref 70–99)

## 2011-08-03 LAB — MAGNESIUM: Magnesium: 2.1 mg/dL (ref 1.5–2.5)

## 2011-08-03 MED ORDER — ENALAPRIL MALEATE 5 MG PO TABS
5.0000 mg | ORAL_TABLET | Freq: Two times a day (BID) | ORAL | Status: DC
  Filled 2011-08-03 (×4): qty 1

## 2011-08-03 NOTE — Progress Notes (Signed)
Subjective: No chest pain or SOB   Physical Exam:  Blood pressure 111/51, pulse 81, temperature 98.5 F (36.9 C), temperature source Oral, resp. rate 17, height 5\' 7"  (1.702 m), weight 175 lb 11.3 oz (79.7 kg), SpO2 93.00%. Temp:  [97.5 F (36.4 C)-98.6 F (37 C)] 98.5 F (36.9 C) (11/24 0325) Pulse Rate:  [73-81] 81  (11/24 0741) Resp:  [15-18] 17  (11/24 0325) BP: (91-117)/(42-56) 111/51 mmHg (11/24 0742) SpO2:  [93 %-97 %] 93 % (11/24 0325) Weight:  [175 lb 11.3 oz (79.7 kg)] 175 lb 11.3 oz (79.7 kg) (11/24 0615) Weight change: -5 lb 8.2 oz (-2.5 kg) I/O 11/23 0701 - 11/24 0700 In: [id number] [P.O.:600; I.V.:396] Out: 1275 [Urine:1275]  HEENT-normal/normal eyelids Neck supple chest - Mildly decreased BS bases CV - RRR Abdomen -NT/ND, no HSM, no mass, + bowel sounds, no bruit Ext-In wraps Neuro-grossly nonfocal  Results for orders placed during the hospital encounter of 07/25/11 (from the past 48 hour(s))  GLUCOSE, CAPILLARY     Status: Abnormal   Collection Time   08/01/11  8:00 AM      Component Value Range Comment   Glucose-Capillary 18 (*) 70 - 99 (mg/dL)    Comment 1 Call MD NNP PA CNM     GLUCOSE, CAPILLARY     Status: Abnormal   Collection Time   08/01/11  8:09 AM      Component Value Range Comment   Glucose-Capillary 350 (*) 70 - 99 (mg/dL)   GLUCOSE, CAPILLARY     Status: Abnormal   Collection Time   08/01/11  8:19 AM      Component Value Range Comment   Glucose-Capillary 275 (*) 70 - 99 (mg/dL)   GLUCOSE, CAPILLARY     Status: Abnormal   Collection Time   08/01/11 11:32 AM      Component Value Range Comment   Glucose-Capillary 275 (*) 70 - 99 (mg/dL)   PRO B NATRIURETIC PEPTIDE     Status: Abnormal   Collection Time   08/01/11  3:13 PM      Component Value Range Comment   BNP, POC 3039.0 (*) 0 - 125 (pg/mL)   GLUCOSE, CAPILLARY     Status: Abnormal   Collection Time   08/01/11  3:18 PM      Component Value Range Comment   Glucose-Capillary 208 (*) 70  - 99 (mg/dL)   GLUCOSE, CAPILLARY     Status: Abnormal   Collection Time   08/01/11  4:37 PM      Component Value Range Comment   Glucose-Capillary 252 (*) 70 - 99 (mg/dL)   GLUCOSE, CAPILLARY     Status: Normal   Collection Time   08/01/11  9:24 PM      Component Value Range Comment   Glucose-Capillary 78  70 - 99 (mg/dL)    Comment 1 Documented in Chart      Comment 2 Notify RN     GLUCOSE, CAPILLARY     Status: Abnormal   Collection Time   08/01/11 11:40 PM      Component Value Range Comment   Glucose-Capillary 266 (*) 70 - 99 (mg/dL)    Comment 1 Notify RN      Comment 2 Documented in Chart     GLUCOSE, CAPILLARY     Status: Abnormal   Collection Time   08/02/11  3:33 AM      Component Value Range Comment   Glucose-Capillary 266 (*) 70 - 99 (mg/dL)  Comment 1 Notify RN      Comment 2 Documented in Chart     CARBOXYHEMOGLOBIN     Status: Normal   Collection Time   08/02/11  4:45 AM      Component Value Range Comment   Total hemoglobin 14.0  12.5 - 16.0 (g/dL)    O2 Saturation 80.0      Carboxyhemoglobin 1.4  0.5 - 1.5 (%)    Methemoglobin 0.9  0.0 - 1.5 (%)   CBC     Status: Abnormal   Collection Time   08/02/11  4:46 AM      Component Value Range Comment   WBC 6.3  4.0 - 10.5 (K/uL)    RBC 4.88  3.87 - 5.11 (MIL/uL)    Hemoglobin 14.1  12.0 - 15.0 (g/dL)    HCT 42.0  36.0 - 46.0 (%)    MCV 86.1  78.0 - 100.0 (fL)    MCH 28.9  26.0 - 34.0 (pg)    MCHC 33.6  30.0 - 36.0 (g/dL)    RDW 18.8 (*) 11.5 - 15.5 (%)    Platelets 180  150 - 400 (K/uL)   COMPREHENSIVE METABOLIC PANEL     Status: Abnormal   Collection Time   08/02/11  4:46 AM      Component Value Range Comment   Sodium 135  135 - 145 (mEq/L)    Potassium 3.9  3.5 - 5.1 (mEq/L)    Chloride 90 (*) 96 - 112 (mEq/L)    CO2 36 (*) 19 - 32 (mEq/L)    Glucose, Bld 243 (*) 70 - 99 (mg/dL)    BUN 13  6 - 23 (mg/dL)    Creatinine, Ser 1.05  0.50 - 1.10 (mg/dL)    Calcium 7.9 (*) 8.4 - 10.5 (mg/dL)    Total  Protein 5.4 (*) 6.0 - 8.3 (g/dL)    Albumin 2.4 (*) 3.5 - 5.2 (g/dL)    AST 42 (*) 0 - 37 (U/L)    ALT 35  0 - 35 (U/L)    Alkaline Phosphatase 273 (*) 39 - 117 (U/L)    Total Bilirubin 0.9  0.3 - 1.2 (mg/dL)    GFR calc non Af Amer 56 (*) >90 (mL/min)    GFR calc Af Amer 65 (*) >90 (mL/min)   MAGNESIUM     Status: Normal   Collection Time   08/02/11  4:46 AM      Component Value Range Comment   Magnesium 1.6  1.5 - 2.5 (mg/dL)   PHOSPHORUS     Status: Normal   Collection Time   08/02/11  4:46 AM      Component Value Range Comment   Phosphorus 3.3  2.3 - 4.6 (mg/dL)   GLUCOSE, CAPILLARY     Status: Abnormal   Collection Time   08/02/11  7:46 AM      Component Value Range Comment   Glucose-Capillary 214 (*) 70 - 99 (mg/dL)    Comment 1 Documented in Chart      Comment 2 Notify RN     GLUCOSE, CAPILLARY     Status: Abnormal   Collection Time   08/02/11  1:13 PM      Component Value Range Comment   Glucose-Capillary 215 (*) 70 - 99 (mg/dL)    Comment 1 Documented in Chart      Comment 2 Notify RN     GLUCOSE, CAPILLARY     Status: Abnormal   Collection Time  08/02/11  5:03 PM      Component Value Range Comment   Glucose-Capillary 173 (*) 70 - 99 (mg/dL)   GLUCOSE, CAPILLARY     Status: Abnormal   Collection Time   08/02/11  9:24 PM      Component Value Range Comment   Glucose-Capillary 151 (*) 70 - 99 (mg/dL)    Comment 1 Documented in Chart      Comment 2 Notify RN     MAGNESIUM     Status: Normal   Collection Time   08/03/11  4:06 AM      Component Value Range Comment   Magnesium 2.1  1.5 - 2.5 (mg/dL)   BASIC METABOLIC PANEL     Status: Abnormal   Collection Time   08/03/11  4:06 AM      Component Value Range Comment   Sodium 135  135 - 145 (mEq/L)    Potassium 3.7  3.5 - 5.1 (mEq/L)    Chloride 95 (*) 96 - 112 (mEq/L)    CO2 32  19 - 32 (mEq/L)    Glucose, Bld 170 (*) 70 - 99 (mg/dL)    BUN 14  6 - 23 (mg/dL)    Creatinine, Ser 1.04  0.50 - 1.10 (mg/dL)      Calcium 8.3 (*) 8.4 - 10.5 (mg/dL)    GFR calc non Af Amer 57 (*) >90 (mL/min)    GFR calc Af Amer 66 (*) >90 (mL/min)   PRO B NATRIURETIC PEPTIDE     Status: Abnormal   Collection Time   08/03/11  4:06 AM      Component Value Range Comment   BNP, POC 2811.0 (*) 0 - 125 (pg/mL)     US Abdomen Complete  08/01/2011  *RADIOLOGY REPORT*  Clinical Data:  Elevated liver function tests. Nausea and vomiting.  COMPLETE ABDOMINAL ULTRASOUND  Comparison:  Abdominal ultrasound 07/25/2011.  Findings:  Gallbladder:  No gallstones, gallbladder wall thickening, or pericholecystic fluid.  Common bile duct:  Measures 0.3 cm.  Liver:  No focal lesion identified.  Within normal limits in parenchymal echogenicity.  IVC:  Appears normal.  Pancreas:  No focal abnormality seen.  Spleen:  Measures 5.1 cm and appears normal.  Right Kidney:  Measures 11.1 cm.  1.2 cm simple cyst in the upper pole noted.  No stones or hydronephrosis.  Left Kidney:  Measures 10.7 cm and appears normal.  Abdominal aorta:  No aneurysm identified.  IMPRESSION: No acute finding.  Normal appearing liver and pancreas.  Small right renal cyst again seen.  Original Report Authenticated By: Arvid Right. Luther Parody, M.D.    Assessment/Plan Patient Active Hospital Problem List: CAD, multiple vessel (07/27/2011) Plan continue medical therapy; hopefully patient will improve to the point of being a candidate for CABG; if not, consider PCI per Dr. Fletcher Anon. HTN (hypertension) BP controlled; continue present meds Hyperlipidemia Continue statin Venous stasis ulcers Wraps in place Systolic CHF, acute on chronic (07/27/2011) Much improved; continue milrinone over the weekend; continue coreg and lasix; increase enalapril to 5 mg po BID; follow renal function.   Kirk Ruths MD 08/03/2011, 7:48 AM

## 2011-08-03 NOTE — Progress Notes (Signed)
SUBJECTIVE Feels ok, no complaints.   1. DKA (diabetic ketoacidoses)   2. HTN (hypertension)   3. Hyperlipidemia   4. Venous stasis ulcers   5. Q waves suggestive of previous myocardial infarction   6. CAD, multiple vessel   7. Systolic CHF, acute on chronic     Past Medical History  Diagnosis Date  . Diabetes mellitus     on insulin, with h/o DKA   . HTN (hypertension)   . Hyperlipidemia   . Venous stasis ulcers   . Q waves suggestive of previous myocardial infarction     Pt denies h/o AMI, cath, PCI  . Angina   . Tuberculosis   . Hyperthyroidism    Current Facility-Administered Medications  Medication Dose Route Frequency Provider Last Rate Last Dose  . 0.9 %  sodium chloride infusion   Intravenous Continuous Jolaine Artist, MD 13 mL/hr at 08/01/11 2009    . acetaminophen (TYLENOL) tablet 650 mg  650 mg Oral Q4H PRN Loralie Champagne, MD   650 mg at 08/03/11 1000  . aspirin EC tablet 325 mg  325 mg Oral Daily Lonn Georgia, PA   325 mg at 08/03/11 [id number]  . carvedilol (COREG) tablet 3.125 mg  3.125 mg Oral BID WC Loralie Champagne, MD   3.125 mg at 08/03/11 0741  . digoxin (LANOXIN) tablet 0.125 mg  0.125 mg Oral Daily Wynetta Emery, PA   0.125 mg at 08/03/11 0953  . doxycycline (VIBRA-TABS) tablet 100 mg  100 mg Oral Q12H Sorin Laza   100 mg at 08/03/11 0953  . enalapril (VASOTEC) tablet 5 mg  5 mg Oral BID WC Lelon Perla, MD      . enoxaparin (LOVENOX) injection 40 mg  40 mg Subcutaneous Q24H Nadine Counts, PHARMD   40 mg at 08/03/11 0953  . escitalopram (LEXAPRO) tablet 20 mg  20 mg Oral Daily Nadine Counts, PHARMD   20 mg at 08/03/11 0953  . furosemide (LASIX) tablet 40 mg  40 mg Oral Daily Jolaine Artist, MD   40 mg at 08/03/11 0953  . insulin aspart (novoLOG) injection 0-9 Units  0-9 Units Subcutaneous TID WC Saima Rizwan   5 Units at 08/03/11 1409  . insulin glargine (LANTUS) injection 10 Units  10 Units Subcutaneous QHS Herminia Warren   10 Units at  08/02/11 2125  . levothyroxine (SYNTHROID, LEVOTHROID) tablet 100 mcg  100 mcg Oral Daily Ria Bush, MD   100 mcg at 08/03/11 [phone]  . milrinone [facility]) infusion 200 mcg/mL (0.2 mg/mL)  0.125 mcg/kg/min Intravenous Continuous Fay Records, MD 3.1 mL/hr at 08/02/11 2036 0.125 mcg/kg/min at 08/02/11 2036  . ondansetron (ZOFRAN) injection 4 mg  4 mg Intravenous Q6H PRN Loralie Champagne, MD   4 mg at 07/31/11 1036  . pantoprazole (PROTONIX) EC tablet 40 mg  40 mg Oral Q1200 Sorin Laza   40 mg at 08/03/11 0954  . potassium chloride SA (K-DUR,KLOR-CON) CR tablet 20 mEq  20 mEq Oral Daily Jolaine Artist, MD   20 mEq at 08/03/11 0953  . rosuvastatin (CRESTOR) tablet 40 mg  40 mg Oral q1800 Loralie Champagne, MD   40 mg at 08/02/11 1720  . sodium chloride 0.9 % injection 10 mL  10 mL Intracatheter PRN Sorin Laza      . spironolactone (ALDACTONE) tablet 25 mg  25 mg Oral Daily Jolaine Artist, MD   25 mg at 08/03/11 0953  . DISCONTD: enalapril (VASOTEC) tablet  2.5 mg  2.5 mg Oral BID WC Wynetta Emery, PA   2.5 mg at 08/03/11 [id number]   Allergies  Allergen Reactions  . Sulfa Antibiotics Anaphylaxis and Swelling   Principal Problem:  *CAD, multiple vessel Active Problems:  HTN (hypertension)  Hyperlipidemia  Venous stasis ulcers  DKA (diabetic ketoacidoses)  Depression  Systolic CHF, acute on chronic  DM type 1 causing renal disease  Hypoglycemia   Vital signs in last 24 hours: Temp:  [97.6 F (36.4 C)-98.6 F (37 C)] 97.6 F (36.4 C) (11/24 1125) Pulse Rate:  [73-81] 75  (11/24 1626) Resp:  [15-18] 18  (11/24 1621) BP: (90-111)/(41-51) 90/41 mmHg (11/24 1627) SpO2:  [93 %-95 %] 94 % (11/24 0742) Weight:  [79.7 kg (175 lb 11.3 oz)] 175 lb 11.3 oz (79.7 kg) (11/24 0615) Weight change: -2.5 kg (-5 lb 8.2 oz) Last BM Date: 08/02/11  Intake/Output from previous day: 11/23 0701 - 11/24 0700 In: 996 [P.O.:600; I.V.:396] Out: 1275 [Urine:1275] Intake/Output this shift: Total I/O In: 426  [P.O.:360; I.V.:66] Out: 450 [Urine:450]  Lab Results:  [facility] 08/02/11 0446 08/01/11 0420  WBC 6.3 6.6  HGB 14.1 14.5  HCT 42.0 42.7  PLT 180 178   BMET  Basename 08/03/11 0406 08/02/11 0446  NA 135 135  K 3.7 3.9  CL 95* 90*  CO2 32 36*  GLUCOSE 170* 243*  BUN 14 13  CREATININE 1.04 1.05  CALCIUM 8.3* 7.9*    Studies/Results: No results found.  Medications: I have reviewed the patient's current medications.   Physical exam GENERAL- alert, well and happy HEAD- normal atraumatic, no neck masses, normal thyroid, no jvd RESPIRATORY- chest clear, no wheezing, crepitations, rhonchi, normal symmetric air entry CVS- regular rate and rhythm, S1, S2 normal, no murmur, click, rub or gallop ABDOMEN- abdomen is soft without significant tenderness, masses, organomegaly or guarding NEURO- Grossly normal EXTREMITIES- extremities normal, atraumatic, no cyanosis or edema  Plan 1. CAD, multiple vesselSystolic CHF, acute on chronic/- stable. Diuresing. Appreciate cardiology/CTVS input. Continue current management per cardiology.  2. HTN (hypertension)- controlled  3. Hyperlipidemia- on crestor.  4. Venous stasis ulcers ?with cellulitis- on doxycycline. Seems improving.  5. Depression- stable.  6. DM insulin dependent- Finger sticks in acceptable range. 7.  DVT/GI prophylaxis.patient's condition remains guarded     Burnell Matlin 08/03/2011 5:39 PM Pager: [phone].

## 2011-08-04 LAB — CBC
HCT: 44.2 % (ref 36.0–46.0)
MCHC: 34.8 g/dL (ref 30.0–36.0)
MCV: 85 fL (ref 78.0–100.0)
Platelets: 182 10*3/uL (ref 150–400)
RDW: 18.1 % — ABNORMAL HIGH (ref 11.5–15.5)

## 2011-08-04 LAB — GLUCOSE, CAPILLARY: Glucose-Capillary: 230 mg/dL — ABNORMAL HIGH (ref 70–99)

## 2011-08-04 LAB — BASIC METABOLIC PANEL
BUN: 14 mg/dL (ref 6–23)
Creatinine, Ser: 1.07 mg/dL (ref 0.50–1.10)
GFR calc Af Amer: 64 mL/min — ABNORMAL LOW (ref >90)
GFR calc non Af Amer: 55 mL/min — ABNORMAL LOW (ref >90)

## 2011-08-04 MED ORDER — ENALAPRIL MALEATE 2.5 MG PO TABS
2.5000 mg | ORAL_TABLET | Freq: Two times a day (BID) | ORAL | Status: DC
  Administered 2011-08-04 – 2011-08-12 (×14): 2.5 mg via ORAL
  Filled 2011-08-04 (×19): qty 1

## 2011-08-04 MED ORDER — INSULIN GLARGINE 100 UNIT/ML ~~LOC~~ SOLN
12.0000 [IU] | Freq: Every day | SUBCUTANEOUS | Status: DC
  Administered 2011-08-04 – 2011-08-08 (×4): 12 [IU] via SUBCUTANEOUS

## 2011-08-04 NOTE — Progress Notes (Signed)
Subjective: No chest pain or SOB; patient states she was "weak" yesterday because of meds and decreased BP   Physical Exam:  Blood pressure 110/53, pulse 77, temperature 98 F (36.7 C), temperature source Oral, resp. rate 18, height 5\' 7"  (1.702 m), weight 172 lb 13.5 oz (78.4 kg), SpO2 94.00%. Temp:  [97.6 F (36.4 C)-98.4 F (36.9 C)] 98 F (36.7 C) (11/25 0339) Pulse Rate:  [75-82] 77  (11/25 0507) Resp:  [16-18] 18  (11/24 1950) BP: (90-129)/(41-53) 110/53 mmHg (11/25 0339) SpO2:  [93 %-94 %] 94 % (11/25 0507) Weight:  [172 lb 13.5 oz (78.4 kg)] 172 lb 13.5 oz (78.4 kg) (11/25 0515) Weight change: -2 lb 13.9 oz (-1.3 kg) I/O 11/24 0701 - 11/25 0700 In: 856 [P.O.:480; I.V.:376] Out: 1580 [Urine:1580]  HEENT-normal/normal eyelids Neck supple chest - CTA CV - RRR Abdomen -NT/ND Ext-In wraps; no edema Neuro-grossly nonfocal  Results for orders placed during the hospital encounter of 07/25/11 (from the past 48 hour(s))  GLUCOSE, CAPILLARY     Status: Abnormal   Collection Time   08/02/11  7:46 AM      Component Value Range Comment   Glucose-Capillary 214 (*) 70 - 99 (mg/dL)    Comment 1 Documented in Chart      Comment 2 Notify RN     GLUCOSE, CAPILLARY     Status: Abnormal   Collection Time   08/02/11  1:13 PM      Component Value Range Comment   Glucose-Capillary 215 (*) 70 - 99 (mg/dL)    Comment 1 Documented in Chart      Comment 2 Notify RN     GLUCOSE, CAPILLARY     Status: Abnormal   Collection Time   08/02/11  5:03 PM      Component Value Range Comment   Glucose-Capillary 173 (*) 70 - 99 (mg/dL)   GLUCOSE, CAPILLARY     Status: Abnormal   Collection Time   08/02/11  9:24 PM      Component Value Range Comment   Glucose-Capillary 151 (*) 70 - 99 (mg/dL)    Comment 1 Documented in Chart      Comment 2 Notify RN     MAGNESIUM     Status: Normal   Collection Time   08/03/11  4:06 AM      Component Value Range Comment   Magnesium 2.1  1.5 - 2.5 (mg/dL)     BASIC METABOLIC PANEL     Status: Abnormal   Collection Time   08/03/11  4:06 AM      Component Value Range Comment   Sodium 135  135 - 145 (mEq/L)    Potassium 3.7  3.5 - 5.1 (mEq/L)    Chloride 95 (*) 96 - 112 (mEq/L)    CO2 32  19 - 32 (mEq/L)    Glucose, Bld 170 (*) 70 - 99 (mg/dL)    BUN 14  6 - 23 (mg/dL)    Creatinine, Ser 1.04  0.50 - 1.10 (mg/dL)    Calcium 8.3 (*) 8.4 - 10.5 (mg/dL)    GFR calc non Af Amer 57 (*) >90 (mL/min)    GFR calc Af Amer 66 (*) >90 (mL/min)   PRO B NATRIURETIC PEPTIDE     Status: Abnormal   Collection Time   08/03/11  4:06 AM      Component Value Range Comment   BNP, POC 2811.0 (*) 0 - 125 (pg/mL)   GLUCOSE, CAPILLARY  Status: Abnormal   Collection Time   08/03/11  7:37 AM      Component Value Range Comment   Glucose-Capillary 142 (*) 70 - 99 (mg/dL)   GLUCOSE, CAPILLARY     Status: Abnormal   Collection Time   08/03/11 11:25 AM      Component Value Range Comment   Glucose-Capillary 168 (*) 70 - 99 (mg/dL)   GLUCOSE, CAPILLARY     Status: Abnormal   Collection Time   08/03/11  1:42 PM      Component Value Range Comment   Glucose-Capillary 268 (*) 70 - 99 (mg/dL)   GLUCOSE, CAPILLARY     Status: Abnormal   Collection Time   08/03/11  5:32 PM      Component Value Range Comment   Glucose-Capillary 149 (*) 70 - 99 (mg/dL)   GLUCOSE, CAPILLARY     Status: Abnormal   Collection Time   08/03/11  9:57 PM      Component Value Range Comment   Glucose-Capillary 292 (*) 70 - 99 (mg/dL)   GLUCOSE, CAPILLARY     Status: Abnormal   Collection Time   08/04/11  3:49 AM      Component Value Range Comment   Glucose-Capillary 251 (*) 70 - 99 (mg/dL)   CBC     Status: Abnormal   Collection Time   08/04/11  4:06 AM      Component Value Range Comment   WBC 7.0  4.0 - 10.5 (K/uL)    RBC 5.20 (*) 3.87 - 5.11 (MIL/uL)    Hemoglobin 15.4 (*) 12.0 - 15.0 (g/dL)    HCT 44.2  36.0 - 46.0 (%)    MCV 85.0  78.0 - 100.0 (fL)    MCH 29.6  26.0 - 34.0  (pg)    MCHC 34.8  30.0 - 36.0 (g/dL)    RDW 18.1 (*) 11.5 - 15.5 (%)    Platelets 182  150 - 400 (K/uL)   BASIC METABOLIC PANEL     Status: Abnormal   Collection Time   08/04/11  4:06 AM      Component Value Range Comment   Sodium 130 (*) 135 - 145 (mEq/L)    Potassium 3.8  3.5 - 5.1 (mEq/L)    Chloride 91 (*) 96 - 112 (mEq/L)    CO2 31  19 - 32 (mEq/L)    Glucose, Bld 258 (*) 70 - 99 (mg/dL)    BUN 14  6 - 23 (mg/dL)    Creatinine, Ser 1.07  0.50 - 1.10 (mg/dL)    Calcium 8.9  8.4 - 10.5 (mg/dL)    GFR calc non Af Amer 55 (*) >90 (mL/min)    GFR calc Af Amer 64 (*) >90 (mL/min)     No results found.  Assessment/Plan Patient Active Hospital Problem List: CAD, multiple vessel (07/27/2011) Plan continue medical therapy; hopefully patient will improve to the point of being a candidate for CABG; if not, consider PCI per Dr. Fletcher Anon. HTN (hypertension) BP was low yesterday; patient complains of weakness; will reduce enalapril to 2.5 BID Hyperlipidemia Continue statin Venous stasis ulcers Wraps in place Systolic CHF, acute on chronic (07/27/2011) Much improved; continue milrinone today and wean to off in AM; continue coreg, lasix and enalapril; titrate meds later as BP and symptoms allow; follow renal function.   Kirk Ruths MD 08/04/2011, 6:30 AM

## 2011-08-04 NOTE — Progress Notes (Addendum)
TB Charting NOT for this patient.

## 2011-08-04 NOTE — Progress Notes (Signed)
Subjective: Feels ok, nauseated this morning.  Objective: Weight change: -1.3 kg (-2 lb 13.9 oz)  Intake/Output Summary (Last 24 hours) at 08/04/11 1007 Last data filed at 08/04/11 0900  Gross per 24 hour  Intake    616 ml  Output   1430 ml  Net   -814 ml   BP 117/57  Pulse 88  Temp(Src) 98 F (36.7 C) (Oral)  Resp 18  Ht 5\' 7"  (1.702 m)  Wt 78.4 kg (172 lb 13.5 oz)  BMI 27.07 kg/m2  SpO2 94% General appearance: alert and cooperative Neck: no adenopathy, no carotid bruit, no JVD, supple, symmetrical, trachea midline and thyroid not enlarged, symmetric, no tenderness/mass/nodules Lungs: clear to auscultation bilaterally Heart: regular rate and rhythm, S1, S2 normal, no murmur, click, rub or gallop Abdomen: soft, non-tender; bowel sounds normal; no masses,  no organomegaly Extremities: edema dry ulcers both legs. Neurologic: Grossly normal  Lab Results: Cbc normal, electrolytes unremarkable.  Micro Results: Recent Results (from the past 240 hour(s))  MRSA PCR SCREENING     Status: Normal   Collection Time   07/29/11  5:29 PM      Component Value Range Status Comment   MRSA by PCR NEGATIVE  NEGATIVE  Final     Studies/Results: Dg Chest 1 View  07/25/2011  *RADIOLOGY REPORT*  Clinical Data: Nausea; hyperglycemia.  Leukocytosis.  CHEST - 1 VIEW  Comparison: None.  Findings: The lungs are well-aerated.  Mild left mid lung linear atelectasis noted.  There is no evidence of pleural effusion or pneumothorax.  The cardiomediastinal silhouette is enlarged.  No acute osseous abnormalities are seen.  IMPRESSION: Mild left mid lung linear atelectasis; cardiomegaly noted.  Original Report Authenticated By: Santa Lighter, M.D.   US Abdomen Complete  08/01/2011  *RADIOLOGY REPORT*  Clinical Data:  Elevated liver function tests. Nausea and vomiting.  COMPLETE ABDOMINAL ULTRASOUND  Comparison:  Abdominal ultrasound 07/25/2011.  Findings:  Gallbladder:  No gallstones, gallbladder wall  thickening, or pericholecystic fluid.  Common bile duct:  Measures 0.3 cm.  Liver:  No focal lesion identified.  Within normal limits in parenchymal echogenicity.  IVC:  Appears normal.  Pancreas:  No focal abnormality seen.  Spleen:  Measures 5.1 cm and appears normal.  Right Kidney:  Measures 11.1 cm.  1.2 cm simple cyst in the upper pole noted.  No stones or hydronephrosis.  Left Kidney:  Measures 10.7 cm and appears normal.  Abdominal aorta:  No aneurysm identified.  IMPRESSION: No acute finding.  Normal appearing liver and pancreas.  Small right renal cyst again seen.  Original Report Authenticated By: Arvid Right. Luther Parody, M.D.   US Abdomen Complete  07/25/2011  *RADIOLOGY REPORT*  Clinical Data:  Right upper quadrant pain, nausea/vomiting, leukocytosis  COMPLETE ABDOMINAL ULTRASOUND  Comparison:  None.  Findings:  Gallbladder:  No gallstones, gallbladder wall thickening, or pericholecystic fluid.  Negative sonographic Murphy's sign.  Common bile duct:  Measures 5 mm.  Liver:  No focal lesion identified.  Within normal limits in parenchymal echogenicity.  IVC:  Appears normal.  Pancreas:  Visualized portions who was limits.  Spleen:  Measures 6.9 cm.  Right Kidney:  Measures 12.7 cm.  13 x 9 x 9 mm interpolar cyst. No hydronephrosis.  Left Kidney:  Measures 11.2 cm.  No mass or hydronephrosis.  Abdominal aorta:  No aneurysm identified.  Additional comments:  Tiny right pleural effusion.  IMPRESSION: Normal sonographic appearance of the gallbladder.  1.3 cm right renal cyst.  Tiny right  pleural effusion.  Original Report Authenticated By: Julian Hy, M.D.   Dg Chest Port 1 View  07/26/2011  *RADIOLOGY REPORT*  Clinical Data: Shortness of breath, congestive heart failure  PORTABLE CHEST - 1 VIEW  Comparison: Portable chest x-ray of 07/25/2011  Findings: Minimal linear atelectasis or scarring in the left mid lung is stable.  No active infiltrate or effusion is seen.  There is no evidence of  congestive heart failure.  Cardiomegaly is stable.  No bony abnormality is seen.  IMPRESSION: Stable cardiomegaly and linear atelectasis or scarring in the left mid lung base.  No CHF.  Original Report Authenticated By: Joretta Bachelor, M.D.   Mr Card Morphology Wo/w Cm  07/31/2011  Cardiac MRI:  Indication: Viability.  Severe 3VD with Cardiogenic Shock  Protocol:  The patient was scanned on a 1.5 Tesla GE magnet. Functional imaging was done using [employer] sequences.  2,3 and 4 chamber views were done to assess RWMA;s.  Quantitative EF was calculated using mass analysis software on a [place]. The patient received 20cc of multihance.  After 10 minutes inversion recovery sequences were done to assess for viability  Findings:  There were bilateral pleural effusions.  There was mild LAE.  RA/RV were normal.  There was severe LVE with diffuse hypokinesis and mid and basilar inferior wall akinesis and thinning.  The quantitative EF was 20% ( EDV 245cc, ESV 198cc, and SV of only 47cc ).  The mid and basilar inferior wall had full thickness scar. Portions of the inferior septum had full thickness scar. The inferolateral wall had subendocardial scar. The anterior wall and apex where without scar.  Impression     1)    Severe LVE with diffuse hypokinesis and mid/basilar inferior       wall akinesis EF 20%        2)    Likely nonviable mid and basilar inferior wall and inferoseptum.  Likely viable inferolateral wall. Likely hibernating anterior and anterior lateral wall (hypokinesis with no scar) 3)    Mild LAE 4)    Normal RA/RV 5)    Bilateral pleural effusions Original Report Authenticated By: [id number]   Medications: Scheduled Meds:   . aspirin EC  325 mg Oral Daily  . carvedilol  3.125 mg Oral BID WC  . digoxin  0.125 mg Oral Daily  . doxycycline  100 mg Oral Q12H  . enalapril  2.5 mg Oral BID WC  . enoxaparin (LOVENOX) injection  40 mg Subcutaneous Q24H  . escitalopram  20 mg Oral Daily  . furosemide   40 mg Oral Daily  . insulin aspart  0-9 Units Subcutaneous TID WC  . insulin glargine  12 Units Subcutaneous QHS  . levothyroxine  100 mcg Oral Daily  . pantoprazole  40 mg Oral Q1200  . potassium chloride  20 mEq Oral Daily  . rosuvastatin  40 mg Oral q1800  . spironolactone  25 mg Oral Daily  . DISCONTD: enalapril  5 mg Oral BID WC  . DISCONTD: insulin glargine  10 Units Subcutaneous QHS   Continuous Infusions:   . sodium chloride 13 mL/hr at 08/03/11 1757  . milrinone 0.125 mcg/kg/min (08/03/11 2041)   PRN Meds:.acetaminophen, ondansetron (ZOFRAN) IV, sodium chloride  Assessment/Plan: 1. CAD, multiple vesselSystolic CHF, acute on chronic/- stable. Appreciate cardiology/CTVS input. Continue current management per cardiology.  2. HTN (hypertension)- controlled  3. Hyperlipidemia- on crestor.  4. Venous stasis ulcers ?with cellulitis- completed course of abx. Will d/c doxycycline.  Continue wound care. 5. Depression- stable.  6. DM insulin dependent-Blood sugar trending up, slowly increase Lantus, tendency to bottom out. 7. DVT/GI prophylaxis.patient's condition remains guarded      LOS: 10 days   Jeorgia Helming 08/04/2011, 10:07 AM

## 2011-08-05 DIAGNOSIS — I509 Heart failure, unspecified: Secondary | ICD-10-CM

## 2011-08-05 DIAGNOSIS — I2510 Atherosclerotic heart disease of native coronary artery without angina pectoris: Secondary | ICD-10-CM

## 2011-08-05 DIAGNOSIS — R5381 Other malaise: Secondary | ICD-10-CM

## 2011-08-05 LAB — BASIC METABOLIC PANEL
CO2: 30 mEq/L (ref 19–32)
Calcium: 9.1 mg/dL (ref 8.4–10.5)
Chloride: 95 mEq/L — ABNORMAL LOW (ref 96–112)
Potassium: 3.4 mEq/L — ABNORMAL LOW (ref 3.5–5.1)
Sodium: 134 mEq/L — ABNORMAL LOW (ref 135–145)

## 2011-08-05 LAB — CARDIAC PANEL(CRET KIN+CKTOT+MB+TROPI)
CK, MB: 2.9 ng/mL (ref 0.3–4.0)
Total CK: 26 U/L (ref 7–177)

## 2011-08-05 LAB — GLUCOSE, CAPILLARY
Glucose-Capillary: 197 mg/dL — ABNORMAL HIGH (ref 70–99)
Glucose-Capillary: 196 mg/dL — ABNORMAL HIGH (ref 70–99)

## 2011-08-05 LAB — PRO B NATRIURETIC PEPTIDE: Pro B Natriuretic peptide (BNP): 3577 pg/mL — ABNORMAL HIGH (ref 0–125)

## 2011-08-05 LAB — LIPASE, BLOOD: Lipase: 37 U/L (ref 11–59)

## 2011-08-05 MED ORDER — POTASSIUM CHLORIDE CRYS ER 20 MEQ PO TBCR
40.0000 meq | EXTENDED_RELEASE_TABLET | Freq: Once | ORAL | Status: AC
  Administered 2011-08-05: 40 meq via ORAL

## 2011-08-05 MED ORDER — POTASSIUM CHLORIDE 20 MEQ/15ML (10%) PO LIQD: 20.0000 meq | Freq: Once | ORAL | Status: DC

## 2011-08-05 NOTE — Progress Notes (Signed)
I await O.T. Evaluation to assist in determining rehab venue needs. I will follow up after that evaluation. Please call me with any questions. Pager [phone].

## 2011-08-05 NOTE — Progress Notes (Addendum)
Subjective: Bridget Martinez denies chest pain or shortness of breath. She has some weakness and complaints of nausea which she states has been going on since before she was admitted.  Walking slowly with a walker with PT. Still on milrinone. Enalapril decreased over the weekend due to hypotension.  Weight down 43 pounds since admission.  Objective: Filed Vitals:   08/05/11 0400 08/05/11 0402 08/05/11 0544 08/05/11 0600  BP: 93/48   108/49  Pulse:      Temp:  98.4 F (36.9 C)    TempSrc:  Oral    Resp: 18     Height:      Weight:   170 lb 6.7 oz (77.3 kg)   SpO2: 95%  98% 95%   Weight change: -2 lb 6.8 oz (-1.1 kg)  Intake/Output Summary (Last 24 hours) at 08/05/11 0721 Last data filed at 08/05/11 0600  Gross per 24 hour  Intake  715.5 ml  Output    600 ml  Net  115.5 ml    General: Alert, awake, oriented x3, in no acute distress.  HEENT: No bruits, min JVD Heart: Regular rate and rhythm, without murmurs, rubs, gallops.  Lungs: CTA bilaterally Abdomen: Soft, nontender, nondistended, positive bowel sounds.  Neuro: Grossly intact, nonfocal. Extremities: She has trace edema, lower extremities are wrapped with Ace bandages and the dressings were not disturbed.   Lab Results:  Basename 08/05/11 0500 08/04/11 0406 08/03/11 0406  NA 134* 130* --  K 3.4* 3.8 --  CL 95* 91* --  CO2 30 31 --  GLUCOSE 160* 258* --  BUN 13 14 --  CREATININE 1.15* 1.07 --  CALCIUM 9.1 8.9 --  MG -- -- 2.1  PHOS -- -- --   No results found for this basename: AST:2,ALT:2,ALKPHOS:2,BILITOT:2,PROT:2,ALBUMIN:2 in the last 72 hours No results found for this basename: LIPASE:2,AMYLASE:2 in the last 72 hours  Basename 08/04/11 0406  WBC 7.0  NEUTROABS --  HGB 15.4*  HCT 44.2  MCV 85.0  PLT 182    Basename 08/05/11 0500 08/03/11 0406  POCBNP 3577.0* 2811.0*   Micro Results: Recent Results (from the past 240 hour(s))  MRSA PCR SCREENING     Status: Normal   Collection Time   07/29/11  5:29 PM       Component Value Range Status Comment   MRSA by PCR NEGATIVE  NEGATIVE  Final     Medications:  I have reviewed the patient's current medications. Scheduled:   . aspirin EC  325 mg Oral Daily  . carvedilol  3.125 mg Oral BID WC  . digoxin  0.125 mg Oral Daily  . enalapril  2.5 mg Oral BID WC  . enoxaparin (LOVENOX) injection  40 mg Subcutaneous Q24H  . escitalopram  20 mg Oral Daily  . furosemide  40 mg Oral Daily  . insulin aspart  0-9 Units Subcutaneous TID WC  . insulin glargine  12 Units Subcutaneous QHS  . levothyroxine  100 mcg Oral Daily  . pantoprazole  40 mg Oral Q1200  . potassium chloride  20 mEq Oral Daily  . rosuvastatin  40 mg Oral q1800  . spironolactone  25 mg Oral Daily  .  milrinone   0.125 mcg per kilogram per minute   IV    .         Patient Active Hospital Problem List: CAD, multiple vessel (07/27/2011)   Assessment: No ongoing ischemic symptoms. She is on aspirin, beta blocker and statin. Plan is either for percutaneous  intervention or bypass surgery, M.D. to advise timing    HTN (hypertension)    Assessment: Meds have been adjusted to prevent hypotension. Continue to follow.   Hyperlipidemia   Assessment:  Continue statin   Venous stasis ulcers    Assessment: Per primary M.D.   DKA (diabetic ketoacidoses) (07/25/2011)   Assessment: Management per primary care   Systolic CHF, acute on chronic (07/27/2011)   Assessment: Consider changing diagnosis to acute systolic CHF as she had never had a cardiac evaluation prior to this admission . She is currently on by mouth Lasix and IV milrinone. Consider tapering and discontinuing the milrinone, continue to follow daily weights as well as strict I.'s and O.'s.   DM type 1 causing renal disease (07/27/2011)   Assessment: Per primary M.D.   Deconditioning Assessment: The patient is being seen by physical therapy, per primary M.D.  Hypokalemia: Assessment: Additional supplement with 40 mEq x1 and  recheck in a.m.   LOS: 11 days   Bridget Martinez 08/05/2011, 7:21 AM   Pt seen and examined. Agree with plan as laid out above. Overall she is much improved though she still is debilitated. I think the best plan would be to have her go to inpatient rehab for 7-10 days to maximize her functional capacity and then have her go to CABG. I have discussed with PT and will also discuss with Dr. Prescott Gum. Will stop milrinone. BP too low to titrate meds further at this point. Will check cardiac markers to make sure nausea is not repeat of her anginal equivalent. Also check gastric emptying study.  Glori Bickers MD 9:22 AM

## 2011-08-05 NOTE — Consult Note (Signed)
Physical Medicine and Rehabilitation Consult Reason for Consult: Weakness related to CHF and coronary artery disease Referring Phsyician: Bensimon MIKINLEY Bridget Martinez is an 60 y.o. female.   HPI: 60 year-old female with history of diabetes mellitus. Admitted November 15 with poor nutritional intake. Abdominal films with no acute findings. Echocardiogram ejection fraction of 30%. Chest x-ray compatible with congestive heart failure responded well to diuresis. Cardiac panel with troponin of 4.60. Cardiac catheterization revealed three-vessel cardiac disease. Placed on Lovenox for deep vein thrombosis prophylaxis. Patient's overall condition is quite debilitated felt that inpatient rehabilitation services was needed however plan was for rehabilitation for 7-10 days and to consider coronary artery bypass surgery per Dr. Lucianne Lei trigt. Patient currently ambulating 175 feet with ataxic gait without assistive device. Inpatient rehabilitation services was consulted at the recommendations of physical therapy to consider inpatient rehabilitation services and then proceed with coronary artery bypass grafting after rehabilitation completed.  Review of Systems  Constitutional: Positive for malaise/fatigue. Negative for fever.  Eyes: Negative for blurred vision.  Respiratory: Positive for shortness of breath. Negative for cough.   Cardiovascular: Negative for chest pain.  Gastrointestinal: Negative for nausea.  Genitourinary: Negative for urgency.  Musculoskeletal: Positive for myalgias.  Neurological: Positive for weakness. Negative for dizziness and headaches.  Psychiatric/Behavioral: Positive for depression.   Past Medical History  Diagnosis Date  . Diabetes mellitus     on insulin, with h/o DKA   . HTN (hypertension)   . Hyperlipidemia   . Venous stasis ulcers   . Q waves suggestive of previous myocardial infarction     Pt denies h/o AMI, cath, PCI  . Angina   . Tuberculosis   . Hyperthyroidism    Past  Surgical History  Procedure Date  . No past surgeries   . Coronary artery bypass graft 07/29/2011    Procedure: CORONARY ARTERY BYPASS GRAFTING (CABG);  Surgeon: Tharon Aquas Adelene Idler, MD;  Location: [facility];  Service: Open Heart Surgery;  Laterality: N/A;   Family History  Problem Relation Age of Onset  . Heart disease Father   . Hypertension Mother   . Multiple sclerosis Father    Social History:  reports that she has never smoked. She does not have any smokeless tobacco history on file. She reports that she does not drink alcohol or use illicit drugs. Allergies:  Allergies  Allergen Reactions  . Sulfa Antibiotics Anaphylaxis and Swelling   Medications Prior to Admission  Medication Dose Route Frequency Provider Last Rate Last Dose  . 0.9 %  sodium chloride infusion   Intravenous Continuous Jolaine Artist, MD 13 mL/hr at 08/05/11 [phone]    . acetaminophen (TYLENOL) tablet 650 mg  650 mg Oral Q4H PRN Loralie Champagne, MD   650 mg at 08/03/11 1000  . aspirin chewable tablet 324 mg  324 mg Oral Once Dianne Dun   324 mg at 07/26/11 0341  . aspirin chewable tablet 324 mg  324 mg Oral Pre-Cath Lonn Georgia, PA      . aspirin chewable tablet 324 mg  324 mg Oral Pre-Cath Jayadeep S. Varanasi   324 mg at 07/29/11 [id number]  . aspirin EC tablet 325 mg  325 mg Oral Daily Lonn Georgia, PA   325 mg at 08/04/11 1053  . carvedilol (COREG) tablet 3.125 mg  3.125 mg Oral BID WC Loralie Champagne, MD   3.125 mg at 08/05/11 0837  . dextrose 50 % solution        25 g  at 08/01/11 0810  . dextrose 50 % solution        25 g at 08/01/11 0805  . diazepam (VALIUM) tablet 5 mg  5 mg Oral On Call Sorin Laza   5 mg at 07/29/11 1215  . digoxin (LANOXIN) tablet 0.125 mg  0.125 mg Oral Daily Wynetta Emery, PA   0.125 mg at 08/04/11 1054  . enalapril (VASOTEC) tablet 2.5 mg  2.5 mg Oral BID WC Lelon Perla, MD   2.5 mg at 08/05/11 [id number]  . enoxaparin (LOVENOX) injection 40 mg  40 mg Subcutaneous Q24H  Nadine Counts, PHARMD   40 mg at 08/04/11 1054  . escitalopram (LEXAPRO) tablet 20 mg  20 mg Oral Daily Nadine Counts, PHARMD   20 mg at 08/04/11 1054  . fentaNYL (SUBLIMAZE) 0.05 MG/ML injection           . furosemide (LASIX) tablet 40 mg  40 mg Oral Daily Jolaine Artist, MD   40 mg at 08/04/11 1054  . gadobenate dimeglumine (MULTIHANCE) injection 20 mL  20 mL Intravenous Once Medication Radiologist   20 mL at 07/30/11 2002  . heparin 100 units/mL bolus via infusion 4,000 Units  4,000 Units Intravenous Once Rogue Bussing, PHARMD   4,000 Units at 07/26/11 0230  . heparin 1000 UNIT/ML injection           . heparin 2-0.9 UNIT/ML-% infusion           . heparin 2-0.9 UNIT/ML-% infusion           . HYDROmorphone (DILAUDID) 1 MG/ML injection        1 mg at 07/25/11 2007  . influenza  inactive virus vaccine (FLUZONE/FLUARIX) injection 0.5 mL  0.5 mL Intramuscular Tomorrow-1000 Ria Bush, MD   0.5 mL at 07/27/11 0930  . insulin aspart (novoLOG) injection 0-9 Units  0-9 Units Subcutaneous TID WC Saima Rizwan   5 Units at 08/04/11 1728  . insulin aspart (novoLOG) injection 10 Units  10 Units Subcutaneous Once Lonn Georgia, PA   10 Units at 07/26/11 1438  . insulin glargine (LANTUS) injection 12 Units  12 Units Subcutaneous QHS Simbiso Ranga   12 Units at 08/04/11 2146  . insulin regular (HUMULIN R,NOVOLIN R) 1 Units/mL in sodium chloride 0.9 % 100 mL infusion   Intravenous To Major John L Molpus, MD   4.4 Units/hr at 07/25/11 1015  . levothyroxine (SYNTHROID, LEVOTHROID) tablet 100 mcg  100 mcg Oral Daily Ria Bush, MD   100 mcg at 08/04/11 1054  . lidocaine (XYLOCAINE) 1 % injection           . lidocaine (XYLOCAINE) 1 % injection           . living well with diabetes book MISC   Does not apply Once [facility] A. Matthews      . magnesium sulfate IVPB 2 g 50 mL  2 g Intravenous Once Simbiso Ranga   2 g at 08/01/11 1008  . magnesium sulfate IVPB 2 g 50 mL  2 g Intravenous Once  Simbiso Ranga   2 g at 08/02/11 0838  . midazolam (VERSED) 2 MG/2ML injection           . nitroGLYCERIN (NTG ON-CALL) 0.2 mg/mL injection           . ondansetron (ZOFRAN) injection 4 mg  4 mg Intravenous Once Karen Chafe Molpus, MD   4 mg at 07/25/11 1324  . ondansetron (ZOFRAN) injection  4 mg  4 mg Intravenous Q6H PRN Loralie Champagne, MD   4 mg at 08/05/11 [id number]  . pantoprazole (PROTONIX) EC tablet 40 mg  40 mg Oral Q1200 Sorin Laza   40 mg at 08/04/11 1054  . pantoprazole (PROTONIX) injection 40 mg  40 mg Intravenous Once Saima Rizwan   40 mg at 07/26/11 0058  . potassium & sodium phosphates (PHOS-NAK) 280-160-250 MG packet 1 packet  1 packet Oral TID WC & HS Simbiso Ranga   1 packet at 08/01/11 2236  . potassium chloride 20 MEQ/15ML (10%) liquid 40 mEq  40 mEq Oral Once Simbiso Ranga   40 mEq at 07/31/11 1515  . potassium chloride SA (K-DUR,KLOR-CON) CR tablet 20 mEq  20 mEq Oral Daily Jolaine Artist, MD   20 mEq at 08/04/11 1054  . potassium chloride SA (K-DUR,KLOR-CON) CR tablet 40 mEq  40 mEq Oral Once Candee Furbish, MD   40 mEq at 07/27/11 1331  . potassium chloride SA (K-DUR,KLOR-CON) CR tablet 40 mEq  40 mEq Oral Once Sueanne Margarita, MD   40 mEq at 07/31/11 [id number]  . potassium chloride SA (K-DUR,KLOR-CON) CR tablet 40 mEq  40 mEq Oral Once Sueanne Margarita, MD   40 mEq at 07/31/11 1018  . potassium chloride SA (K-DUR,KLOR-CON) CR tablet 40 mEq  40 mEq Oral Once Lonn Georgia, PA   40 mEq at 08/05/11 0827  . rosuvastatin (CRESTOR) tablet 40 mg  40 mg Oral q1800 Loralie Champagne, MD   40 mg at 08/04/11 1728  . sodium chloride 0.9 % bolus 2,000 mL  2,000 mL Intravenous Once John L Molpus, MD   2,000 mL at 07/25/11 0200  . sodium chloride 0.9 % bolus 250 mL  250 mL Intravenous Once Minus Breeding, MD   250 mL at 07/27/11 0128  . sodium chloride 0.9 % injection 10 mL  10 mL Intracatheter PRN Sorin Laza      . spironolactone (ALDACTONE) tablet 25 mg  25 mg Oral Daily Jolaine Artist, MD   25 mg at  08/04/11 1053  . verapamil (ISOPTIN) 2.5 MG/ML injection           . DISCONTD: 0.45 % sodium chloride infusion   Intravenous Continuous Ria Bush, MD      . DISCONTD: 0.9 %  sodium chloride infusion   Intravenous Continuous Ria Bush, MD      . DISCONTD: 0.9 %  sodium chloride infusion   Intravenous Continuous Dianne Dun 50 mL/hr at 07/27/11 0100    . DISCONTD: 0.9 %  sodium chloride infusion  1 mL/kg/hr Intravenous Continuous Lonn Georgia, PA      . DISCONTD: 0.9 %  sodium chloride infusion  250 mL Intravenous Continuous Lonn Georgia, PA      . DISCONTD: 0.9 %  sodium chloride infusion  250 mL Intravenous Continuous Loralie Champagne, MD      . DISCONTD: 0.9 %  sodium chloride infusion   Intravenous Continuous Sorin Laza 10 mL/hr at 07/29/11 0751    . DISCONTD: aspirin EC tablet 325 mg  325 mg Oral Daily Dianne Dun      . DISCONTD: carvedilol (COREG) tablet 6.25 mg  6.25 mg Oral BID WC Loralie Champagne, MD      . DISCONTD: dextrose 5 %-0.45 % sodium chloride infusion   Intravenous Continuous Ria Bush, MD   125 mL at 07/25/11 1138  . DISCONTD: dextrose 5 %-0.9 % sodium chloride infusion  Intravenous Continuous Sorin Laza 10 mL/hr at 07/29/11 1400    . DISCONTD: dextrose 5 %-0.9 % sodium chloride infusion   Intravenous Continuous Irine Seal      . DISCONTD: dextrose 50 % solution 25 mL  25 mL Intravenous PRN Ria Bush, MD   25 mL at 07/29/11 2242  . DISCONTD: diazepam (VALIUM) tablet 5 mg  5 mg Oral On Call Lonn Georgia, PA      . DISCONTD: diazepam (VALIUM) tablet 5 mg  5 mg Oral On Call Jettie Booze      . DISCONTD: doxycycline (VIBRA-TABS) tablet 100 mg  100 mg Oral Q12H Sorin Laza   100 mg at 08/03/11 2158  . DISCONTD: enalapril (VASOTEC) tablet 2.5 mg  2.5 mg Oral BID WC Jolaine Artist, MD      . DISCONTD: enalapril (VASOTEC) tablet 2.5 mg  2.5 mg Oral BID WC Wynetta Emery, PA   2.5 mg at 08/03/11 0742  . DISCONTD: enalapril  (VASOTEC) tablet 5 mg  5 mg Oral BID WC Lelon Perla, MD      . DISCONTD: escitalopram (LEXAPRO) tablet 10 mg  10 mg Oral Daily Ria Bush, MD   10 mg at 07/29/11 1008  . DISCONTD: furosemide (LASIX) injection 40 mg  40 mg Intravenous BID Loralie Champagne, MD   40 mg at 07/27/11 0839  . DISCONTD: furosemide (LASIX) injection 40 mg  40 mg Intravenous BID Jolaine Artist, MD   40 mg at 08/01/11 1727  . DISCONTD: furosemide (LASIX) tablet 40 mg  40 mg Oral Daily Sorin Laza   40 mg at 07/28/11 1004  . DISCONTD: heparin ADULT infusion 100 units/mL (25000 units/250 mL)  10 Units/kg/hr Intravenous Continuous Dianne Dun      . DISCONTD: heparin ADULT infusion 100 units/mL (25000 units/250 mL)  1,050 Units/hr Intravenous Continuous Michelle A. Matthews 12 mL/hr at 07/26/11 0331 1,200 Units/hr at 07/26/11 0331  . DISCONTD: heparin injection 5,000 Units  5,000 Units Subcutaneous Q8H Ria Bush, MD   5,000 Units at 07/26/11 0019  . DISCONTD: HYDROmorphone (DILAUDID) 0.5 mg/mL in sodium chloride 0.9 % 100 mL infusion  0.5 mg/hr Intravenous Once Dianne Dun      . DISCONTD: HYDROmorphone (DILAUDID) injection 0.5 mg  0.5 mg Intravenous Once Dianne Dun      . DISCONTD: insulin aspart (novoLOG) injection 0-5 Units  0-5 Units Subcutaneous QHS Saima Rizwan   2 Units at 07/31/11 2332  . DISCONTD: insulin glargine (LANTUS) injection 10 Units  10 Units Subcutaneous QHS Simbiso Ranga   10 Units at 08/03/11 2159  . DISCONTD: insulin glargine (LANTUS) injection 12 Units  12 Units Subcutaneous QHS Simbiso Ranga      . DISCONTD: insulin glargine (LANTUS) injection 15 Units  15 Units Subcutaneous QHS Saima Rizwan   15 Units at 07/26/11 2212  . DISCONTD: insulin glargine (LANTUS) injection 15 Units  15 Units Subcutaneous QHS Dianne Dun      . DISCONTD: insulin glargine (LANTUS) injection 20 Units  20 Units Subcutaneous Daily Saima Rizwan   20 Units at 07/28/11 1004  .  DISCONTD: insulin glargine (LANTUS) injection 20 Units  20 Units Subcutaneous QHS Sorin Laza   20 Units at 07/31/11 2328  . DISCONTD: insulin regular (HUMULIN R,NOVOLIN R) 1 Units/mL in sodium chloride 0.9 % 100 mL infusion   Intravenous Continuous Ria Bush, MD      . DISCONTD: metoprolol tartrate (LOPRESSOR) 25 mg/10 mL oral suspension  25 mg  25 mg Oral BID Dianne Dun   25 mg at 07/26/11 [id number]  . DISCONTD: milrinone (PRIMACOR) infusion 200 mcg/mL (0.2 mg/mL)  0.25 mcg/kg/min Intravenous Continuous Shaune Pascal [name], MD 3.5 mL/hr at 08/02/11 0800 0.125 mcg/kg/min at 08/02/11 0800  . DISCONTD: milrinone (PRIMACOR) infusion 200 mcg/mL (0.2 mg/mL)  0.125 mcg/kg/min Intravenous Continuous Fay Records, MD 3.1 mL/hr at 08/05/11 0121 0.125 mcg/kg/min at 08/05/11 0121  . DISCONTD: nitroGLYCERIN 0.2 mg/mL in dextrose 5 % infusion  10 mcg/min Intravenous Titrated Lonn Georgia, PA      . DISCONTD: ondansetron (ZOFRAN) injection 4 mg  4 mg Intravenous Q6H Saima Rizwan   4 mg at 07/30/11 [id number]  . DISCONTD: ondansetron (ZOFRAN) injection 4 mg  4 mg Intravenous Q4H PRN Dianne Dun      . DISCONTD: pantoprazole (PROTONIX) injection 40 mg  40 mg Intravenous Daily Saima Rizwan   40 mg at 07/27/11 1009  . DISCONTD: potassium chloride 10 mEq in 100 mL IVPB  10 mEq Intravenous Q1H Ria Bush, MD      . DISCONTD: potassium chloride SA (K-DUR,KLOR-CON) CR tablet 20 mEq  20 mEq Oral BID Wellington Hampshire, MD   20 mEq at 07/31/11 1849  . DISCONTD: potassium chloride SA (K-DUR,KLOR-CON) CR tablet 40 mEq  40 mEq Oral Daily Sorin Laza      . DISCONTD: rosuvastatin (CRESTOR) tablet 20 mg  20 mg Oral q1800 Dianne Dun      . DISCONTD: rosuvastatin (CRESTOR) tablet 20 mg  20 mg Oral q1800 Dianne Dun      . DISCONTD: simvastatin (ZOCOR) tablet 20 mg  20 mg Oral Daily Ria Bush, MD      . DISCONTD: sodium chloride 0.9 % injection 3 mL  3 mL Intravenous Q12H Lonn Georgia,  PA      . DISCONTD: sodium chloride 0.9 % injection 3 mL  3 mL Intravenous PRN Lonn Georgia, PA      . DISCONTD: sodium chloride 0.9 % injection 3 mL  3 mL Intravenous Q12H Loralie Champagne, MD   3 mL at 07/28/11 2201  . DISCONTD: sodium chloride 0.9 % injection 3 mL  3 mL Intravenous PRN Loralie Champagne, MD      . DISCONTD: spironolactone (ALDACTONE) tablet 12.5 mg  12.5 mg Oral Daily Jolaine Artist, MD   12.5 mg at 07/30/11 [id number]   No current outpatient prescriptions on file as of 08/05/2011.    Home: Home Living Lives With: Spouse;Son Type of Home: House Home Layout: One level Home Access: Stairs to enter Entrance Stairs-Rails: None Entrance Stairs-Number of Steps: 4 Bathroom Shower/Tub: Optometrist: Yes Home Adaptive Equipment: None  Functional History: Prior Function Level of Independence: Independent with basic ADLs;Independent with homemaking with ambulation;Independent with gait;Independent with transfers Able to Take Stairs?: Yes Driving: Yes Functional Status:  Mobility: Bed Mobility Bed Mobility: No Supine to Sit: 5: Supervision;HOB elevated (Comment degrees);With rails Supine to Sit Details (indicate cue type and reason): Supervision for pt safety. Verbal cues to initiate. Sitting - Scoot to Edge of Bed: 4: Min assist Sitting - Scoot to [place] of Bed Details (indicate cue type and reason): Min A for pt balance.  Verbal cues to initiate weight shifts. Transfers Transfers: Yes Sit to Stand: 4: Min assist;With upper extremity assist;From chair/3-in-1;From toilet (grab bar used from toilet) Sit to Stand Details (indicate cue type and reason): cues for UE use and to  get closer prior to sitting Stand to Sit: 4: Min assist;With upper extremity assist;To chair/3-in-1;To toilet Stand to Sit Details: cues to get closer prior to sitting Ambulation/Gait Ambulation/Gait: Yes Ambulation/Gait Assistance: 4: Min  assist Ambulation/Gait Assistance Details (indicate cue type and reason): A for balance.  pt staggered gait worse with fatigue.  pt incontinent of urine in hall and had to sit in desk chair to be rolled to bathroom Ambulation Distance (Feet): 175 Feet Assistive device: None Gait Pattern: Ataxic;Shuffle (Try RW next time) Gait velocity: 0.67 Stairs: No Wheelchair Mobility Wheelchair Mobility: No  ADL:    Cognition: Cognition Arousal/Alertness: Awake/alert (Flat affect) Orientation Level: Oriented X4 Cognition Arousal/Alertness: Awake/alert (Flat affect) Overall Cognitive Status: Appears within functional limits for tasks assessed Orientation Level: Oriented X4 Cognition - Other Comments: Flat affect  Blood pressure 107/46, pulse 77, temperature 98.1 F (36.7 C), temperature source Oral, resp. rate 18, height 5\' 7"  (1.702 m), weight 77.3 kg (170 lb 6.7 oz), SpO2 96.00%. Physical Exam  Constitutional: She is oriented to person, place, and time.       Frail female in no acute distress  HENT:  Head: Normocephalic.  Neck: Normal range of motion. Neck supple. No thyromegaly present.  Cardiovascular: Normal rate.   Pulmonary/Chest: Effort normal. No respiratory distress. She has no wheezes.  Abdominal: Soft. She exhibits no distension. There is no tenderness.  Musculoskeletal: She exhibits edema.       +1 edema lower extremity  Neurological: She is oriented to person, place, and time. She has normal reflexes.       Strength in her upper [place] is grossly 4-4+ out of 5. Partially strength is 3+ to 4/5 proximally to 4+ out of 5 distally.  Skin: Skin is warm and dry. There is erythema.       Bilateral lower extremities with 1-2+ edema. There is mild erythema and warmth. She has dressings over open areas on the shins. Trace to 1+ edema in the upper extremities.  Psychiatric: She has a normal mood and affect.       Mood is flat    Results for orders placed during the hospital  encounter of 07/25/11 (from the past 24 hour(s))  GLUCOSE, CAPILLARY     Status: Abnormal   Collection Time   08/04/11 11:57 AM      Component Value Range   Glucose-Capillary 198 (*) 70 - 99 (mg/dL)  GLUCOSE, CAPILLARY     Status: Abnormal   Collection Time   08/04/11  4:37 PM      Component Value Range   Glucose-Capillary 298 (*) 70 - 99 (mg/dL)  GLUCOSE, CAPILLARY     Status: Abnormal   Collection Time   08/04/11  9:24 PM      Component Value Range   Glucose-Capillary 230 (*) 70 - 99 (mg/dL)   Comment 1 Documented in Chart     Comment 2 Notify RN    PRO B NATRIURETIC PEPTIDE     Status: Abnormal   Collection Time   08/05/11  5:00 AM      Component Value Range   BNP, POC 3577.0 (*) 0 - 125 (pg/mL)  BASIC METABOLIC PANEL     Status: Abnormal   Collection Time   08/05/11  5:00 AM      Component Value Range   Sodium 134 (*) 135 - 145 (mEq/L)   Potassium 3.4 (*) 3.5 - 5.1 (mEq/L)   Chloride 95 (*) 96 - 112 (mEq/L)   CO2 30  19 - 32 (mEq/L)   Glucose, Bld 160 (*) 70 - 99 (mg/dL)   BUN 13  6 - 23 (mg/dL)   Creatinine, Ser 1.15 (*) 0.50 - 1.10 (mg/dL)   Calcium 9.1  8.4 - 10.5 (mg/dL)   GFR calc non Af Amer 50 (*) >90 (mL/min)   GFR calc Af Amer 58 (*) >90 (mL/min)  GLUCOSE, CAPILLARY     Status: Abnormal   Collection Time   08/05/11  8:15 AM      Component Value Range   Glucose-Capillary 197 (*) 70 - 99 (mg/dL)   No results found.  Assessment/Plan: Diagnosis: Deconditioning after CHF and related three-vessel coronary artery disease 1. Does the need for close, 24 hr/day medical supervision in concert with the patient's rehab needs make it unreasonable for this patient to be served in a less intensive setting? Yes and Potentially 2. Co-Morbidities requiring supervision/potential complications: Hypertension and diabetes 3. Due to bladder management, bowel management, disease management and medication administration, does the patient require 24 hr/day rehab nursing?  Potentially 4. Does the patient require coordinated care of a physician, rehab nurse, PT and OT to address physical and functional deficits in the context of the above medical diagnosis(es)? No and Potentially Addressing deficits in the following areas: balance, dressing, grooming, locomotion, strength and transferring 5. Can the patient actively participate in an intensive therapy program of at least 3 hrs of therapy per day at least 5 days per week? Yes 6. The potential for patient to make measurable gains while on inpatient rehab is good to fair 7. Anticipated functional outcomes upon discharge from inpatients are modified independent PT, modified independent OT 8. Estimated rehab length of stay to reach the above functional goals is: 1 week 9. Does the patient have adequate social supports to accommodate these discharge functional goals? Yes 10. Anticipated D/C setting: surgery 11. Anticipated post D/C treatments: To be determined 12. Overall Rehab/Functional Prognosis: excellent  RECOMMENDATIONS: This patient's condition is appropriate for continued rehabilitative care in the following setting: HH vs CIR Patient has agreed to participate in recommended program. Yes Note that insurance prior authorization may be required for reimbursement for recommended care.  Comment: Patient is at a minimal assistance to supervision level already with mobility. I don't see the therapy note this morning in the chart but she did walk around the entire nursing unit today. The patient states that she felt very good with improved stamina and cardio- respiratory tolerance.  Need occupational therapy assessment to determine if there are not needs to justify an inpatient rehabilitation stay. At this point it would be difficult to justify inpatient rehabilitation based on her functional levels. Rehabilitation nurse to followup. Thank you    08/05/2011

## 2011-08-05 NOTE — Progress Notes (Signed)
7 Days Post-Op Procedure(s) (LRB): RIGHT HEART CATH (N/A)                      [address]            ,[place] [address]          [phone]     HPI  Patient stronger,able to walk around unit w/o much SOB         Nausea persists         Leg ulcers wrapped  Objective: Vital signs in last 24 hours: Temp:  [98.1 F (36.7 C)-98.5 F (36.9 C)] 98.2 F (36.8 C) (11/26 1700) Pulse Rate:  [76-77] 76  (11/26 1154) Cardiac Rhythm:  [-] Normal sinus rhythm (11/26 1610) Resp:  [18-20] 18  (11/26 0400) BP: (90-116)/(40-49) 116/46 mmHg (11/26 1610) SpO2:  [93 %-98 %] 96 % (11/26 1700) Weight:  [170 lb 6.7 oz (77.3 kg)] 170 lb 6.7 oz (77.3 kg) (11/26 0544)  Hemodynamic parameters for last 24 hours: CVP:  [3 mmHg-5 mmHg] 3 mmHg  Intake/Output from previous day: 11/25 0701 - 11/26 0700 In: 732 [P.O.:480; I.V.:252] Out: 600 [Urine:600] Intake/Output this shift: Total I/O In: 513 [P.O.:480; I.V.:33] Out: 600 [Urine:600]  EXAM    0000000 systolic                No s3 gallop                NSR  Basename 08/04/11 0406  WBC 7.0  HGB 15.4*  HCT 44.2  PLT 182   BMET:  Basename 08/05/11 0500 08/04/11 0406  NA 134* 130*  K 3.4* 3.8  CL 95* 91*  CO2 30 31  GLUCOSE 160* 258*  BUN 13 14  CREATININE 1.15* 1.07  CALCIUM 9.1 8.9    PT/INR: No results found for this basename: LABPROT,INR in the last 72 hours ABG    Component Value Date/Time   HCO3 27.6* 07/29/2011 1326   TCO2 29 07/29/2011 1326   ACIDBASEDEF 14.0* 07/25/2011 0303   O2SAT 80.0 08/02/2011 0445   CBG (last 3)   Basename 08/05/11 1721 08/05/11 1155 08/05/11 0815  GLUCAP 145* 259* 197*    Assessment/Plan: S/P Procedure(s) (LRB): RIGHT HEART CATH (N/A) PLAN   In view of anterior-apical viability on MRI and clinical improvement of CHF I agree that CABG is probably reasonable. Timing and preop preparation will be critical.I will order carotid -precabg dopplers, PFTs and vein mapping. She will  need another ECHO to asses MR and RHC to document a better C.I.  She will need to heal the ulcers as well.  Will be out of town but will check back in a week.    LOS: 11 days    VAN TRIGT III,Amil Bouwman 08/05/2011

## 2011-08-05 NOTE — Progress Notes (Signed)
Physical Therapy Treatment Patient Details Name: Bridget Martinez MRN: [id number] DOB: [birth year] Today's Date: 08/05/2011  PT Assessment/Plan  PT - Assessment/Plan Comments on Treatment Session: Pt more alert and talkative this session.  Plan to perform further balance assessment next session.  Also recommed OT consult, Rehab consult and Cardiac Rehab. PT Plan: Frequency remains appropriate PT Frequency: Min 3X/week Follow Up Recommendations: Inpatient Rehab (MD wants inpatient rehab for pt to get stronger for CABG) Equipment Recommended: Rolling walker with 5" wheels MD requested inpatient rehab for pt to become stronger prior to CABG.  Pt has decrease gait speed and unstable gait.  Pt having to use RW at this time but was walking without RW PTA.  PT Goals  Acute Rehab PT Goals Time For Goal Achievement: 7 days PT Goal: Supine/Side to Sit - Progress: Progressing toward goal PT Transfer Goal: Bed to Chair/Chair to Bed - Progress: Progressing toward goal PT Goal: Ambulate - Progress: Progressing toward goal  PT Treatment Precautions/Restrictions  Precautions Precautions: Fall Required Braces or Orthoses: No Restrictions Weight Bearing Restrictions: No Mobility (including Balance) Bed Mobility Bed Mobility: Yes Supine to Sit: 5: Supervision;HOB elevated (Comment degrees);With rails Supine to Sit Details (indicate cue type and reason): Supervision for safety. Transfers Transfers: Yes Sit to Stand: 4: Min assist;With upper extremity assist;From chair/3-in-1;From toilet Sit to Stand Details (indicate cue type and reason): VCs for hand placement Stand to Sit: 4: Min assist;With upper extremity assist;To chair/3-in-1;To toilet Ambulation/Gait Ambulation/Gait: Yes Ambulation/Gait Assistance: 4: Min assist Ambulation/Gait Assistance Details (indicate cue type and reason): (A) for balance.  Pt with unstable gait and occasional LOB. Pt with righ lean with ambulation and noticeable  overpronation on left foot during SLS with ambulation. Ambulation Distance (Feet): 300 Feet Assistive device: Rolling walker Gait Pattern: Ataxic;Shuffle Gait velocity: .93 ft/sec;  Continues to have decrease gait speed which increases fall risk. Stairs: No Wheelchair Mobility Wheelchair Mobility: No  Posture/Postural Control Posture/Postural Control: No significant limitations Balance Balance Assessed:  (Balance not assessed this session due to fatigue.) Exercise    End of Session PT - End of Session Equipment Utilized During Treatment: Gait belt Activity Tolerance: Patient limited by fatigue Patient left: in chair;with call bell in reach;with family/visitor present Nurse Communication: Mobility status for transfers;Mobility status for ambulation General Behavior During Session: [facility] for tasks performed (Pt more talkative and alert this session.) Cognition: WFL for tasks performed  Bridget Martinez 08/05/2011, 10:21 AM

## 2011-08-05 NOTE — Progress Notes (Signed)
Cardiology/CIR appreciated. SUBJECTIVE Feels ok. C/o nausea, no vomiting   1. DKA (diabetic ketoacidoses)   2. HTN (hypertension)   3. Hyperlipidemia   4. Venous stasis ulcers   5. Q waves suggestive of previous myocardial infarction   6. CAD, multiple vessel   7. Systolic CHF, acute on chronic     Past Medical History  Diagnosis Date  . Diabetes mellitus     on insulin, with h/o DKA   . HTN (hypertension)   . Hyperlipidemia   . Venous stasis ulcers   . Q waves suggestive of previous myocardial infarction     Pt denies h/o AMI, cath, PCI  . Angina   . Tuberculosis   . Hyperthyroidism    Current Facility-Administered Medications  Medication Dose Route Frequency Provider Last Rate Last Dose  . 0.9 %  sodium chloride infusion   Intravenous Continuous Jolaine Artist, MD 13 mL/hr at 08/05/11 0900    . acetaminophen (TYLENOL) tablet 650 mg  650 mg Oral Q4H PRN Loralie Champagne, MD   650 mg at 08/03/11 1000  . aspirin EC tablet 325 mg  325 mg Oral Daily Lonn Georgia, PA   325 mg at 08/04/11 1053  . carvedilol (COREG) tablet 3.125 mg  3.125 mg Oral BID WC Loralie Champagne, MD   3.125 mg at 08/05/11 0837  . digoxin (LANOXIN) tablet 0.125 mg  0.125 mg Oral Daily Wynetta Emery, PA   0.125 mg at 08/04/11 1054  . enalapril (VASOTEC) tablet 2.5 mg  2.5 mg Oral BID WC Lelon Perla, MD   2.5 mg at 08/05/11 [id number]  . enoxaparin (LOVENOX) injection 40 mg  40 mg Subcutaneous Q24H Nadine Counts, PHARMD   40 mg at 08/04/11 1054  . escitalopram (LEXAPRO) tablet 20 mg  20 mg Oral Daily Nadine Counts, PHARMD   20 mg at 08/04/11 1054  . furosemide (LASIX) tablet 40 mg  40 mg Oral Daily Jolaine Artist, MD   40 mg at 08/04/11 1054  . insulin aspart (novoLOG) injection 0-9 Units  0-9 Units Subcutaneous TID WC Saima Rizwan   2 Units at 08/05/11 1005  . insulin glargine (LANTUS) injection 12 Units  12 Units Subcutaneous QHS Morris Markham   12 Units at 08/04/11 2146  . levothyroxine  (SYNTHROID, LEVOTHROID) tablet 100 mcg  100 mcg Oral Daily Ria Bush, MD   100 mcg at 08/04/11 1054  . ondansetron (ZOFRAN) injection 4 mg  4 mg Intravenous Q6H PRN Loralie Champagne, MD   4 mg at 08/05/11 [id number]  . pantoprazole (PROTONIX) EC tablet 40 mg  40 mg Oral Q1200 Sorin Laza   40 mg at 08/04/11 1054  . potassium chloride 20 MEQ/15ML (10%) liquid 20 mEq  20 mEq Oral Once Elenie Coven      . potassium chloride SA (K-DUR,KLOR-CON) CR tablet 20 mEq  20 mEq Oral Daily Jolaine Artist, MD   20 mEq at 08/04/11 1054  . potassium chloride SA (K-DUR,KLOR-CON) CR tablet 40 mEq  40 mEq Oral Once Lonn Georgia, PA   40 mEq at 08/05/11 0827  . rosuvastatin (CRESTOR) tablet 40 mg  40 mg Oral q1800 Loralie Champagne, MD   40 mg at 08/04/11 1728  . sodium chloride 0.9 % injection 10 mL  10 mL Intracatheter PRN Sorin Laza      . spironolactone (ALDACTONE) tablet 25 mg  25 mg Oral Daily Jolaine Artist, MD   25 mg at 08/04/11 1053  .  DISCONTD: milrinone (PRIMACOR) infusion 200 mcg/mL (0.2 mg/mL)  0.125 mcg/kg/min Intravenous Continuous Fay Records, MD 3.5 mL/hr at 08/05/11 0800 0.142 mcg/kg/min at 08/05/11 0800   Allergies  Allergen Reactions  . Sulfa Antibiotics Anaphylaxis and Swelling   Principal Problem:  *CAD, multiple vessel Active Problems:  HTN (hypertension)  Hyperlipidemia  Venous stasis ulcers  DKA (diabetic ketoacidoses)  Depression  Systolic CHF, acute on chronic  DM type 1 causing renal disease  Hypoglycemia   Vital signs in last 24 hours: Temp:  [97.8 F (36.6 C)-98.5 F (36.9 C)] 98.1 F (36.7 C) (11/26 0805) Pulse Rate:  [73-83] 77  (11/26 0805) Resp:  [18-20] 18  (11/26 0400) BP: (90-114)/(40-55) 107/46 mmHg (11/26 0805) SpO2:  [93 %-98 %] 96 % (11/26 0805) Weight:  [77.3 kg (170 lb 6.7 oz)] 170 lb 6.7 oz (77.3 kg) (11/26 0544) Weight change: -1.1 kg (-2 lb 6.8 oz) Last BM Date: 08/03/11  Intake/Output from previous day: 11/25 0701 - 11/26 0700 In: 732 [P.O.:480;  I.V.:252] Out: 600 [Urine:600] Intake/Output this shift: Total I/O In: 33 [I.V.:33] Out: -   Lab Results:  Basename 08/04/11 0406  WBC 7.0  HGB 15.4*  HCT 44.2  PLT 182   BMET  Basename 08/05/11 0500 08/04/11 0406  NA 134* 130*  K 3.4* 3.8  CL 95* 91*  CO2 30 31  GLUCOSE 160* 258*  BUN 13 14  CREATININE 1.15* 1.07  CALCIUM 9.1 8.9    Studies/Results: No results found.  Medications: I have reviewed the patient's current medications.   Physical exam GENERAL- alert and well HEAD- normal atraumatic, no neck masses, normal thyroid, no jvd RESPIRATORY- chest clear, no wheezing, crepitations, rhonchi, normal symmetric air entry CVS- regular rate and rhythm, S1, S2 normal, no murmur, click, rub or gallop ABDOMEN- abdomen is soft without significant tenderness, masses, organomegaly or guarding NEURO- Grossly normal EXTREMITIES- dry venous stasis ulcers.  Plan 1. CAD, multiple vesselSystolic CHF, acute on chronic/- stable. Slight bump in creatinine. Monitor closely. Appreciate cardiology/CTVS input. Continue current management per cardiology.  2. HTN (hypertension)- controlled  3. Hyperlipidemia- on crestor.  4. Venous stasis ulcers ?with cellulitis- completed course of abx. Wound care appreciated. 5. Depression- stable.  6. DM insulin dependent-generally in acceptable range, slowly increase Lantus, tendency to bottom out.  7. DVT/GI prophylaxis.patient's condition remains guarded  8. Nausea-agree with checking cardiac enzymes. Check amylase/lipase.     Kaily Wragg 08/05/2011 10:27 AM Pager: [phone].

## 2011-08-05 NOTE — Progress Notes (Signed)
Inpatient Diabetes Program Recommendations  AACE/ADA: New Consensus Statement on Inpatient Glycemic Control (2009)  Target Ranges:  Prepandial:   less than 140 mg/dL      Peak postprandial:   less than 180 mg/dL (1-2 hours)      Critically ill patients:  140 - 180 mg/dL   Reason for Visit: Hyperglycemia  Inpatient Diabetes Program Recommendations Insulin - Basal: agree with current order Correction (SSI): Increase to moderate correction scale

## 2011-08-05 NOTE — Progress Notes (Signed)
Orthopedic Tech Progress Note Patient Details:  NINETTA CROSLIN [birth year] [id number]      Braulio Bosch 08/05/2011, 4:07 PM Applied bilateral unna boots

## 2011-08-06 ENCOUNTER — Inpatient Hospital Stay (HOSPITAL_COMMUNITY): Payer: BC Managed Care – PPO

## 2011-08-06 LAB — BASIC METABOLIC PANEL
BUN: 16 mg/dL (ref 6–23)
CO2: 28 mEq/L (ref 19–32)
Chloride: 93 mEq/L — ABNORMAL LOW (ref 96–112)
GFR calc Af Amer: 45 mL/min — ABNORMAL LOW (ref >90)
Glucose, Bld: 296 mg/dL — ABNORMAL HIGH (ref 70–99)
Potassium: 4.2 mEq/L (ref 3.5–5.1)

## 2011-08-06 LAB — HEPATIC FUNCTION PANEL
ALT: 36 U/L — ABNORMAL HIGH (ref 0–35)
Alkaline Phosphatase: 318 U/L — ABNORMAL HIGH (ref 39–117)
Bilirubin, Direct: 0.4 mg/dL — ABNORMAL HIGH (ref 0.0–0.3)
Indirect Bilirubin: 0.4 mg/dL (ref 0.3–0.9)
Total Bilirubin: 0.8 mg/dL (ref 0.3–1.2)

## 2011-08-06 MED ORDER — PANTOPRAZOLE SODIUM 40 MG PO TBEC
40.0000 mg | DELAYED_RELEASE_TABLET | Freq: Two times a day (BID) | ORAL | Status: DC
  Administered 2011-08-06 – 2011-08-12 (×12): 40 mg via ORAL
  Filled 2011-08-06 (×10): qty 1

## 2011-08-06 MED ORDER — TECHNETIUM TC 99M SULFUR COLLOID
2.0000 | Freq: Once | INTRAVENOUS | Status: AC | PRN
  Administered 2011-08-06: 2 via ORAL

## 2011-08-06 MED ORDER — CARVEDILOL 6.25 MG PO TABS
6.2500 mg | ORAL_TABLET | Freq: Two times a day (BID) | ORAL | Status: DC
  Administered 2011-08-06 – 2011-08-07 (×2): 6.25 mg via ORAL
  Filled 2011-08-06 (×4): qty 1

## 2011-08-06 NOTE — Progress Notes (Signed)
Discussed with Dr. Naaman Plummer after O.T. Evaluation today. Feel patient can go home with home health and 24/7 assist of family vs SNF rehabiliation before CABG. Please, call with any questions. Pager [phone].

## 2011-08-06 NOTE — Progress Notes (Signed)
Occupational Therapy Evaluation Patient Details Name: Bridget Martinez MRN: [id number] DOB: 1950/11/21 Today's Date: 08/06/2011  Problem List:  Patient Active Problem List  Diagnoses  . HTN (hypertension)  . Hyperlipidemia  . Venous stasis ulcers  . DKA (diabetic ketoacidoses)  . Depression  . CAD, multiple vessel  . Systolic CHF, acute on chronic  . DM type 1 causing renal disease  . Hypoglycemia    Past Medical History:  Past Medical History  Diagnosis Date  . Diabetes mellitus     on insulin, with h/o DKA   . HTN (hypertension)   . Hyperlipidemia   . Venous stasis ulcers   . Q waves suggestive of previous myocardial infarction     Pt denies h/o AMI, cath, PCI  . Angina   . Tuberculosis   . Hyperthyroidism    Past Surgical History:  Past Surgical History  Procedure Date  . No past surgeries   . Coronary artery bypass graft 07/29/2011    Procedure: CORONARY ARTERY BYPASS GRAFTING (CABG);  Surgeon: Tharon Aquas Adelene Idler, MD;  Location: [facility];  Service: Open Heart Surgery;  Laterality: N/A;    OT Assessment/Plan/Recommendation OT Assessment Clinical Impression Statement: This 60 y.o. female presents to OT with generalized weakness, decreased endurance, impaired balance, slow processing.  Feel she would benefit from OT to allow pt. to return home with husband at supervision level.   OT Recommendation/Assessment: Patient will need skilled OT in the acute care venue OT Problem List: Decreased strength;Decreased activity tolerance;Impaired balance (sitting and/or standing) Barriers to Discharge: None OT Therapy Diagnosis : Generalized weakness OT Plan OT Frequency: Min 2X/week OT Treatment/Interventions: Self-care/ADL training;DME and/or AE instruction;Energy conservation;Therapeutic activities;Patient/family education;Balance training OT Recommendation Follow Up Recommendations: Inpatient Rehab Equipment Recommended: Tub/shower seat Individuals Consulted Consulted and  Agree with Results and Recommendations: Patient;Family member/caregiver OT Goals Acute Rehab OT Goals OT Goal Formulation: With patient Time For Goal Achievement: 2 weeks ADL Goals Pt Will Perform Grooming: with supervision;Standing at sink Pt Will Perform Upper Body Bathing: with supervision;Standing at sink Pt Will Perform Lower Body Bathing: Sit to stand from chair;with supervision Pt Will Perform Upper Body Dressing: with supervision (for set up) Pt Will Perform Lower Body Dressing: with supervision;Sit to stand from chair;Sit to stand from bed Pt Will Transfer to Toilet: with supervision;Ambulation Pt Will Perform Toileting - Clothing Manipulation: with supervision;Standing Pt Will Perform Tub/Shower Transfer: with supervision;Shower seat without back Additional ADL Goal #1: Pt. will independently incorporate energy conservation techniques into daily activities  OT Evaluation Precautions/Restrictions  Precautions Precautions: Fall Required Braces or Orthoses: No Restrictions Weight Bearing Restrictions: No Prior Functioning Home Living Lives With: Spouse Bathroom Shower/Tub: Walk-in Radio producer: Handicapped height Bathroom Accessibility: Yes How Accessible: Accessible via walker Prior Function Level of Independence: Independent with basic ADLs;Independent with gait;Needs assistance with homemaking Driving: Yes Vocation: Retired Comments: Pt and husband report falls with hypoglycemic events ADL ADL Eating/Feeding: Simulated;Independent Where Assessed - Eating/Feeding: Chair Grooming: Performed;Wash/dry hands;Minimal assistance Where Assessed - Grooming: Standing at sink Upper Body Bathing: Simulated;Set up Where Assessed - Upper Body Bathing: Sitting, chair Lower Body Bathing: Simulated;Minimal assistance (for balance) Where Assessed - Lower Body Bathing: Sit to stand from chair Upper Body Dressing: Simulated;Set up Where Assessed - Upper Body Dressing:  Sitting, chair Lower Body Dressing: Simulated;Minimal assistance Where Assessed - Lower Body Dressing: Sit to stand from chair Toilet Transfer: Performed;Minimal assistance Toilet Transfer Method: Ambulating Toilet Transfer Equipment: Comfort height toilet Toileting - Clothing Manipulation: Performed;Minimal assistance (min  guard assist) Where Assessed - Toileting Clothing Manipulation: Standing Toileting - Hygiene: Performed;Independent Where Assessed - Toileting Hygiene: Sit on 3-in-1 or toilet Tub/Shower Transfer: Not assessed Equipment Used:  (none) ADL Comments: Pt. fatigues with ADL acitivies requiring min A for dynamic standing balance Vision/Perception  Vision - History Baseline Vision:  (Pt reports that she hasn't seen ophthalmologist "in a while") Patient Visual Report: No change from baseline Vision - Assessment Vision Assessment: Vision not tested Cognition Cognition Arousal/Alertness: Awake/alert Overall Cognitive Status: Impaired (Pt. slow to process information) Orientation Level: Oriented X4 Sensation/Coordination Sensation Light Touch: Impaired by gross assessment (Pt. reports tingling due to neuropathy) Coordination Gross Motor Movements are Fluid and Coordinated: Yes Fine Motor Movements are Fluid and Coordinated: Yes Extremity Assessment RUE Assessment RUE Assessment: Within Functional Limits LUE Assessment LUE Assessment: Within Functional Limits Mobility  Transfers Transfers: Yes Sit to Stand: 4: Min assist Stand to Sit: 4: Min assist Exercises   End of Session OT - End of Session Equipment Utilized During Treatment:  (none) Activity Tolerance: Patient limited by fatigue Patient left: in chair;with call bell in reach;with family/visitor present General Behavior During Session: [facility] for tasks performed Cognition: Impaired (slow processing)   Kristine Chahal M 08/06/2011, 5:10 PM

## 2011-08-06 NOTE — Progress Notes (Signed)
OT Cancellation Note  Treatment cancelled today due to patient receiving procedure or test:  Pt. Going for gastric emptying test.  Will re-attempt.  Lucille Passy, OTR/L [phone]

## 2011-08-06 NOTE — Progress Notes (Signed)
Bridget Martinez is a 60 y.o. female patient, diabetic, admitted on 07/25/11, with nausea and high sugars, found to be in DKA and to have NSTEMI, eventually having cardiac cath(diffuse 3 vessel disease)/heart MRI which showed viability. Patient being followed by CTVS/Cardiology; input appreciated. Patient felt to be a candidate for CABG but requires some fine tuning prior to proceeding. The heart failure team has been diligently managing her to optimize her cardiac status. Patient has been seen by Dr Naaman Plummer from [facility] where patient has been referred for rehab prior to CABG. She is awaiting OT to finalize on the decision. During the hospital stay she has been managed for labile blood sugars(HbA1C). She bottomed out to 18mg /dl and was unconscious, since then, her lantus dose has been gradually increased, being liberal on hyperglycemia, especially in setting of AKI. She has also complained of persistent nausea, whose etiology not entirely clear. A gastric emptying study was normal today. The concern now would be for gallbladder disease(US abdomen negative) vs gastritis vs medication side effect? Which one. Her LFTs were elevated earlier, felt to be due to hepatic engorgement- could be due to other causes. Patient also completed course of doxycycline for bilateral lower extremity cellulitis.  SUBJECTIVE Feels ok today. No chest pain. Admits to nausea.   1. DKA (diabetic ketoacidoses)   2. HTN (hypertension)   3. Hyperlipidemia   4. Venous stasis ulcers   5. Q waves suggestive of previous myocardial infarction   6. CAD, multiple vessel   7. Systolic CHF, acute on chronic   8. Coronary atherosclerosis of native coronary artery     Past Medical History  Diagnosis Date  . Diabetes mellitus     on insulin, with h/o DKA   . HTN (hypertension)   . Hyperlipidemia   . Venous stasis ulcers   . Q waves suggestive of previous myocardial infarction     Pt denies h/o AMI, cath, PCI  . Angina   . Tuberculosis     . Hyperthyroidism    Current Facility-Administered Medications  Medication Dose Route Frequency Provider Last Rate Last Dose  . acetaminophen (TYLENOL) tablet 650 mg  650 mg Oral Q4H PRN Loralie Champagne, MD   650 mg at 08/03/11 1000  . aspirin EC tablet 325 mg  325 mg Oral Daily Lonn Georgia, PA   325 mg at 08/06/11 0940  . carvedilol (COREG) tablet 6.25 mg  6.25 mg Oral BID WC Amy Clegg, NP      . digoxin (LANOXIN) tablet 0.125 mg  0.125 mg Oral Daily Wynetta Emery, PA   0.125 mg at 08/06/11 0940  . enalapril (VASOTEC) tablet 2.5 mg  2.5 mg Oral BID WC Lelon Perla, MD   2.5 mg at 08/06/11 0940  . enoxaparin (LOVENOX) injection 40 mg  40 mg Subcutaneous Q24H Nadine Counts, PHARMD   40 mg at 08/06/11 [id number]  . escitalopram (LEXAPRO) tablet 20 mg  20 mg Oral Daily Nadine Counts, PHARMD   20 mg at 08/06/11 0940  . insulin aspart (novoLOG) injection 0-9 Units  0-9 Units Subcutaneous TID WC Saima Rizwan   7 Units at 08/06/11 0941  . insulin glargine (LANTUS) injection 12 Units  12 Units Subcutaneous QHS Jenah Vanasten   12 Units at 08/05/11 2306  . levothyroxine (SYNTHROID, LEVOTHROID) tablet 100 mcg  100 mcg Oral Daily Ria Bush, MD   100 mcg at 08/06/11 0941  . ondansetron (ZOFRAN) injection 4 mg  4 mg Intravenous Q6H PRN Dalton  Aundra Dubin, MD   4 mg at 08/05/11 2004  . pantoprazole (PROTONIX) EC tablet 40 mg  40 mg Oral Q1200 Sorin Laza   40 mg at 08/05/11 1301  . rosuvastatin (CRESTOR) tablet 40 mg  40 mg Oral q1800 Loralie Champagne, MD   40 mg at 08/05/11 1745  . sodium chloride 0.9 % injection 10 mL  10 mL Intracatheter PRN Sorin Laza      . spironolactone (ALDACTONE) tablet 25 mg  25 mg Oral Daily Jolaine Artist, MD   25 mg at 08/06/11 0940  . technetium sulfur colloid ([place]) injection solution 2 milli Curie  2 milli Curie Oral Once PRN Medication Radiologist   2 milli Curie at 08/06/11 1115  . DISCONTD: 0.9 %  sodium chloride infusion   Intravenous Continuous Jolaine Artist, MD 13 mL/hr at 08/05/11 0900    . DISCONTD: carvedilol (COREG) tablet 3.125 mg  3.125 mg Oral BID WC Loralie Champagne, MD   3.125 mg at 08/05/11 1610  . DISCONTD: furosemide (LASIX) tablet 40 mg  40 mg Oral Daily Jolaine Artist, MD   40 mg at 08/05/11 1109  . DISCONTD: potassium chloride SA (K-DUR,KLOR-CON) CR tablet 20 mEq  20 mEq Oral Daily Jolaine Artist, MD   20 mEq at 08/05/11 1108   Allergies  Allergen Reactions  . Sulfa Antibiotics Anaphylaxis and Swelling   Principal Problem:  *CAD, multiple vessel Active Problems:  HTN (hypertension)  Hyperlipidemia  Venous stasis ulcers  DKA (diabetic ketoacidoses)  Depression  Systolic CHF, acute on chronic  DM type 1 causing renal disease  Hypoglycemia   Vital signs in last 24 hours: Temp:  [97.7 F (36.5 C)-98.5 F (36.9 C)] 97.8 F (36.6 C) (11/27 0829) Pulse Rate:  [70] 70  (11/27 0829) Resp:  [16] 16  (11/27 0400) BP: (88-116)/(43-53) 105/48 mmHg (11/27 0829) SpO2:  [94 %-97 %] 97 % (11/27 0829) Weight:  [75.3 kg (166 lb 0.1 oz)] 166 lb 0.1 oz (75.3 kg) (11/27 0500) Weight change: -2 kg (-4 lb 6.6 oz) Last BM Date: 08/03/11  Intake/Output from previous day: 11/26 0701 - 11/27 0700 In: 873 [P.O.:840; I.V.:33] Out: 1303 [id number] Intake/Output this shift: Total I/O In: 120 [P.O.:120] Out: -   Lab Results:  Basename 08/04/11 0406  WBC 7.0  HGB 15.4*  HCT 44.2  PLT 182   BMET  Basename 08/06/11 0500 08/05/11 0500  NA 131* 134*  K 4.2 3.4*  CL 93* 95*  CO2 28 30  GLUCOSE 296* 160*  BUN 16 13  CREATININE 1.43* 1.15*  CALCIUM 9.5 9.1    Studies/Results: No results found.  Medications: I have reviewed the patient's current medications.   Physical exam GENERAL- alert and well HEAD- normal atraumatic, no neck masses, normal thyroid, no jvd RESPIRATORY- chest clear, no wheezing, crepitations, rhonchi, normal symmetric air entry CVS- regular rate and rhythm, S1, S2 normal, no murmur,  click, rub or gallop ABDOMEN- abdomen is soft without significant tenderness, masses, organomegaly or guarding NEURO- Grossly normal EXTREMITIES- edema none  Plan 1. CAD, multiple vesselSystolic CHF, acute on chronic/-    Appreciate cardiology/CTVS input. Continue current management per cardiology. Follow work up per CTVS. 2. HTN (hypertension)- controlled  3. Hyperlipidemia- on crestor.  4. Venous stasis ulcers ?with cellulitis- completed course of abx. Wound care appreciated.  5. Depression- stable.  6. DM insulin dependent-generally in acceptable range, slowly increase Lantus, tendency to bottom out.  7. DVT/GI prophylaxis8. Nausea-No gastric emptying delay. Will  order HIDA scan, recheck LFTs. Change protonix to bid dosing. Consider gi eval, depending on HIDA scan. Disposition- transfer to telemetry today.       Mikaia Janvier 08/06/2011 2:18 PM Pager: [phone].

## 2011-08-06 NOTE — Progress Notes (Signed)
*  PRELIMINARY RESULTS*  Carotid Dopplers has been performed.  No significant ICA stenosis bilaterally. Vertebral arteries are patent with antegrade flow.  Rich Brave 08/06/2011, 2:03 PM

## 2011-08-06 NOTE — Progress Notes (Signed)
OT evaluation completed, full write up to follow.  Recommend CIR - pt. Fatigues quickly with activity - overall min A with ADLs.  Lucille Passy, OTR/L [phone]

## 2011-08-06 NOTE — Progress Notes (Signed)
Subjective:   Continues to complain of intermittent nausea.Milrinone stopped yesterday. Cardiac markers negative. Weight decreased 47 pounds since admission.   Denies SOB/CP. BM yesterday. Ambulating with PT. OT consult pending. LLE wound healed. RLE wound superficial wound improving.   Seen by Dr. Darcey Nora yesterday pre-CABG studies ordered. Requesting repeat RHC and echo. Also healing of LE wounds.    Intake/Output Summary (Last 24 hours) at 08/06/11 0741 Last data filed at 08/06/11 0500  Gross per 24 hour  Intake    873 ml  Output   1303 ml  Net   -430 ml    Current meds:    . aspirin EC  325 mg Oral Daily  . carvedilol  3.125 mg Oral BID WC  . digoxin  0.125 mg Oral Daily  . enalapril  2.5 mg Oral BID WC  . enoxaparin (LOVENOX) injection  40 mg Subcutaneous Q24H  . escitalopram  20 mg Oral Daily  . furosemide  40 mg Oral Daily  . insulin aspart  0-9 Units Subcutaneous TID WC  . insulin glargine  12 Units Subcutaneous QHS  . levothyroxine  100 mcg Oral Daily  . pantoprazole  40 mg Oral Q1200  . potassium chloride  20 mEq Oral Daily  . potassium chloride  40 mEq Oral Once  . rosuvastatin  40 mg Oral q1800  . spironolactone  25 mg Oral Daily  . DISCONTD: potassium chloride  20 mEq Oral Once   Infusions:    . DISCONTD: sodium chloride 13 mL/hr at 08/05/11 0900  . DISCONTD: milrinone 0.142 mcg/kg/min (08/05/11 0800)     Objective:  Blood pressure 103/53, pulse 76, temperature 97.7 F (36.5 C), temperature source Oral, resp. rate 16, height 5\' 7"  (1.702 m), weight 75.3 kg (166 lb 0.1 oz), SpO2 97.00%. Weight change: -2 kg (-4 lb 6.6 oz)  Physical Exam: CVP 3-4 General:  Well appearing. No resp difficulty HEENT: normal Neck: supple. JVP5-6  . Carotids 2+ bilat; no bruits. No lymphadenopathy or thryomegaly appreciated. Cor: PMI nondisplaced. Regular rate & rhythm. No rubs, gallops or murmurs. Lungs: clear Abdomen: soft, nontender, nondistended. No  hepatosplenomegaly. No bruits or masses. Good bowel sounds. Extremities: no cyanosis, clubbing, rash, edema R and LLE unna boots in place Neuro: alert & orientedx3, cranial nerves grossly intact. moves all 4 extremities w/o difficulty. Affect pleasant  Telemetry: Sinus Rhythm  Lab Results: Basic Metabolic Panel:  Lab 0000000 0500 08/05/11 0500 08/04/11 0406 08/03/11 0406 08/02/11 0446 08/01/11 0420  NA 131* 134* 130* 135 135 --  K 4.2 3.4* -- -- -- --  CL 93* 95* 91* 95* 90* --  CO2 28 30 31  32 36* --  GLUCOSE 296* 160* 258* 170* 243* --  BUN 16 13 14 14 13  --  CREATININE 1.43* 1.15* 1.07 1.04 1.05 --  CALCIUM 9.5 9.1 8.9 8.3* 7.9* --  MG 1.9 -- -- 2.1 1.6 1.1*  PHOS -- -- -- -- 3.3 2.2*   Liver Function Tests:  Lab 08/02/11 0446 08/01/11 0420  AST 42* 40*  ALT 35 39*  ALKPHOS 273* 289*  BILITOT 0.9 0.9  PROT 5.4* 5.5*  ALBUMIN 2.4* 2.4*    Lab 08/05/11 1130  LIPASE 37  AMYLASE 15   No results found for this basename: AMMONIA:5 in the last 168 hours CBC:  Lab 08/04/11 0406 08/02/11 0446 08/01/11 0420 07/31/11 0446  WBC 7.0 6.3 6.6 7.1  NEUTROABS -- -- -- --  HGB 15.4* 14.1 14.5 14.2  HCT 44.2 42.0 42.7  41.9  MCV 85.0 86.1 85.4 85.2  PLT 182 180 178 177   Cardiac Enzymes:  Lab 08/05/11 1400  CKTOTAL 26  CKMB 2.9  CKMBINDEX --  TROPONINI <0.30   BNP:  Lab 08/05/11 0500 08/03/11 0406 08/01/11 1513  POCBNP 3577.0* 2811.0* 3039.0*   CBG:  Lab 08/05/11 2124 08/05/11 1721 08/05/11 1155 08/05/11 0815 08/04/11 2124  GLUCAP 196* 145* 259* 197* 230*   Microbiology: No results found for this basename: cult   No results found for this basename: CULT:2,SDES:2 in the last 168 hours  Imaging: No results found.   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 Volume status stable. Creatinine increased will stop Lasix and potassium. Increase Carvedilol 6.25 mg bid. OT consult pending. Possible CIR after PT/OT consult completed.   LOS: 12 days    CLEGG,AMY, NP 08/06/2011, 7:41 AM  Patient seen and examined with Darrick Grinder, NP. We discussed all aspects of the encounter. I agree with the assessment and plan as stated above. She continues to improve. LLE wound is healed. RLE wound much improved. Cardiac output has normalized by co-oximetry so will discuss with Dr. Darcey Nora if repeat full RHC still needed. Awaiting inpatient rehab decision (pending OT eval). I feel CIR is best option to get her ready for CABG. Gastric emptying study today. Titrate meds as BP tolerates. Can go to 2000.    Paris Hohn BensimhonMD 9:44 AM

## 2011-08-07 ENCOUNTER — Inpatient Hospital Stay (HOSPITAL_COMMUNITY): Payer: BC Managed Care – PPO

## 2011-08-07 DIAGNOSIS — N179 Acute kidney failure, unspecified: Secondary | ICD-10-CM | POA: Diagnosis not present

## 2011-08-07 LAB — COMPREHENSIVE METABOLIC PANEL
ALT: 29 U/L (ref 0–35)
Albumin: 2.8 g/dL — ABNORMAL LOW (ref 3.5–5.2)
Calcium: 9.2 mg/dL (ref 8.4–10.5)
GFR calc Af Amer: 36 mL/min — ABNORMAL LOW (ref >90)
Glucose, Bld: 302 mg/dL — ABNORMAL HIGH (ref 70–99)
Sodium: 131 mEq/L — ABNORMAL LOW (ref 135–145)
Total Protein: 6.4 g/dL (ref 6.0–8.3)

## 2011-08-07 LAB — CBC
Hemoglobin: 16.6 g/dL — ABNORMAL HIGH (ref 12.0–15.0)
MCH: 29.4 pg (ref 26.0–34.0)
MCHC: 34.4 g/dL (ref 30.0–36.0)
Platelets: 193 10*3/uL (ref 150–400)
RDW: 17.8 % — ABNORMAL HIGH (ref 11.5–15.5)

## 2011-08-07 LAB — GLUCOSE, CAPILLARY
Glucose-Capillary: 254 mg/dL — ABNORMAL HIGH (ref 70–99)
Glucose-Capillary: 479 mg/dL — ABNORMAL HIGH (ref 70–99)
Glucose-Capillary: 361 mg/dL — ABNORMAL HIGH (ref 70–99)

## 2011-08-07 MED ORDER — SODIUM CHLORIDE 0.9 % IV BOLUS (SEPSIS)
250.0000 mL | Freq: Once | INTRAVENOUS | Status: AC
  Administered 2011-08-07: 250 mL via INTRAVENOUS

## 2011-08-07 MED ORDER — SPIRONOLACTONE 12.5 MG HALF TABLET
12.5000 mg | ORAL_TABLET | Freq: Every day | ORAL | Status: DC
  Filled 2011-08-07: qty 1

## 2011-08-07 MED ORDER — CARVEDILOL 3.125 MG PO TABS
3.1250 mg | ORAL_TABLET | Freq: Two times a day (BID) | ORAL | Status: DC
  Administered 2011-08-08 – 2011-08-12 (×8): 3.125 mg via ORAL
  Filled 2011-08-07 (×13): qty 1

## 2011-08-07 NOTE — Progress Notes (Signed)
Inpatient Diabetes Program Recommendations  AACE/ADA: New Consensus Statement on Inpatient Glycemic Control (2009)  Target Ranges:  Prepandial:   less than 140 mg/dL      Peak postprandial:   less than 180 mg/dL (1-2 hours)      Critically ill patients:  140 - 180 mg/dL   Reason for Visit: Hyperglycemia CBGs 254, 479 today.  Lantus dose not given last p.m.  Inpatient Diabetes Program Recommendations Insulin - Basal: agree with current order (did not receive dose last p.m.) Correction (SSI): agree with current order  Note: RN sticky note about patient afraid to take Lantus bc caused hypoglycemia previously.  Patient admitted on Levemir 25 units a.m. 20 units p.m. Which have proved to be too much for this hospital stay.  Dose was quickly adjusted down and pt has been stable even above target fasting on current Lantus dose 12 units.  Last Hypoglycemic event was 07/31/11 on Lantus 20 units.  Patient may also benefit from addition of Novolog Meal Coverage during admission for better post-prandial control.  Diabetes Coordinator will speak with patient about current Lantus dose.  Will continue to follow. Thank you

## 2011-08-07 NOTE — Progress Notes (Signed)
Physical Therapy Cancellation  Patient Details Name: Bridget Martinez MRN: [id number] DOB: 1951/06/02 Today's Date: 08/07/2011  Pt off floor for procedure.  Will check back this PM if time allows.  Thanks!!!!   Azure Barrales 08/07/2011, 9:28 AM [id number]

## 2011-08-07 NOTE — Progress Notes (Signed)
Subjective:   Continues to complain of intermittent nausea and weakness. Gastric emptying study relatively normal. Weight decreased another pound. BP soft.  Carvedilol increased to 6.25 yesterday with decrease in blood pressure noted.  Lasix stopped yesterday weight. CIR denied admission to high functioning level.   Denies CP/SOB/edema. Ambulating with PT.    Intake/Output Summary (Last 24 hours) at 08/07/11 0942 Last data filed at 08/07/11 0500  Gross per 24 hour  Intake    480 ml  Output    100 ml  Net    380 ml    Current meds:    . aspirin EC  325 mg Oral Daily  . carvedilol  6.25 mg Oral BID WC  . digoxin  0.125 mg Oral Daily  . enalapril  2.5 mg Oral BID WC  . enoxaparin (LOVENOX) injection  40 mg Subcutaneous Q24H  . escitalopram  20 mg Oral Daily  . insulin aspart  0-9 Units Subcutaneous TID WC  . insulin glargine  12 Units Subcutaneous QHS  . levothyroxine  100 mcg Oral Daily  . pantoprazole  40 mg Oral BID AC  . rosuvastatin  40 mg Oral q1800  . spironolactone  25 mg Oral Daily  . DISCONTD: pantoprazole  40 mg Oral Q1200   Infusions:     Objective:  Blood pressure 90/52, pulse 66, temperature 97.8 F (36.6 C), temperature source Oral, resp. rate 18, height 5\' 7"  (1.702 m), weight 74.9 kg (165 lb 2 oz), SpO2 100.00%. Weight change: 0.1 kg (3.5 oz)  Physical Exam: General:  Well appearing. No resp difficulty HEENT: normal Neck: supple. JVP 5-6 . Carotids 2+ bilat; no bruits. No lymphadenopathy or thryomegaly appreciated. Cor: PMI nondisplaced. Regular rate & rhythm. No rubs, gallops or murmurs. Lungs: clear Abdomen: soft, nontender, nondistended. No hepatosplenomegaly. No bruits or masses. Good bowel sounds. Extremities: no cyanosis, clubbing, rash, edema Neuro: alert & orientedx3, cranial nerves grossly intact. moves all 4 extremities w/o difficulty. Affect pleasant  Telemetry: SR  Lab Results: Basic Metabolic Panel:  Lab [id number] 08/06/11 0500  08/05/11 0500 08/04/11 0406 08/03/11 0406 08/02/11 0446 08/01/11 0420  NA 131* 131* 134* 130* 135 -- --  K 4.2 4.2 -- -- -- -- --  CL 91* 93* 95* 91* 95* -- --  CO2 24 28 30 31  32 -- --  GLUCOSE 302* 296* 160* 258* 170* -- --  BUN 24* 16 13 14 14  -- --  CREATININE 1.73* 1.43* 1.15* 1.07 1.04 -- --  CALCIUM 9.2 9.5 9.1 8.9 8.3* -- --  MG -- 1.9 -- -- 2.1 1.6 1.1*  PHOS -- -- -- -- -- 3.3 2.2*   Liver Function Tests:  Lab 08/07/11 0654 08/06/11 1810 08/02/11 0446 08/01/11 0420  AST 38* 46* 42* 40*  ALT 29 36* 35 39*  ALKPHOS 276* 318* 273* 289*  BILITOT 0.9 0.8 0.9 0.9  PROT 6.4 7.0 5.4* 5.5*  ALBUMIN 2.8* 2.9* 2.4* 2.4*    Lab 08/05/11 1130  LIPASE 37  AMYLASE 15   No results found for this basename: AMMONIA:5 in the last 168 hours CBC:  Lab 08/07/11 0654 08/04/11 0406 08/02/11 0446 08/01/11 0420  WBC 10.0 7.0 6.3 6.6  NEUTROABS -- -- -- --  HGB 16.6* 15.4* 14.1 14.5  HCT 48.2* 44.2 42.0 42.7  MCV 85.5 85.0 86.1 85.4  PLT 193 182 180 178   Cardiac Enzymes:  Lab 08/05/11 1400  CKTOTAL 26  CKMB 2.9  CKMBINDEX --  TROPONINI <0.30  BNP:  Lab 08/05/11 0500 08/03/11 0406 08/01/11 1513  POCBNP 3577.0* 2811.0* 3039.0*   CBG:  Lab 08/07/11 0636 08/06/11 2216 08/06/11 1736 08/06/11 1504 08/06/11 0825  GLUCAP 254* 159* 176* 200* 302*   Microbiology: No results found for this basename: cult   No results found for this basename: CULT:2,SDES:2 in the last 168 hours  Imaging: Nm Gastric Emptying  08/06/2011  *RADIOLOGY REPORT*  Clinical Data:  Nausea and reflux.  NUCLEAR MEDICINE GASTRIC EMPTYING SCAN  Technique:  After oral ingestion of radiolabeled meal, sequential abdominal images were obtained for 120 minutes.  Residual percentage of activity remaining within the stomach was calculated at 60 and 120 minutes.  Radiopharmaceutical: 2.0 mCi Tc-24m sulfur colloid.  Comparison:  None  Findings: Corrected counts for assessment of residual radiolabeled meal in the  stomach indicate the following percent remaining:  30 minutes:  85% remaining 60 minutes:  93% remaining (this higher count is likely due to distribution of meal in the stomach along with a single plane imaging rather than dual plane geometric mean calculation). 90 minutes:  72% remaining 120 minutes:  27% remaining  Normally, under 30% is remaining at 120 minutes.  IMPRESSION:  1.  Gastric emptying measures in the normal range at 120 minutes although it seems mildly slow to empty prior to the 120-minute mark. Accordingly the study is considered technically normal but in the upper normal range for emptying time.  Original Report Authenticated By: Carron Curie, M.D.     ASSESSMENT:   1) Cardiogenic shock, resolved  -- off Milrinone  2) Acute systolic HF secondary to ischemic CM, EF 25-30%  3) 3v-CAD, severe  --LAD 80% mid LCX 80% prox, OM1 95% RCA T prox with R->R & L-> R collats  4) Morbid obesity  5) DM2  6) Leg ulcers  7) Hyperlipidemia  8) Moderate to severe MR/TR     PLAN/DISCUSSION:  Appears euvolemic to hypovolemic. Weight decreased 48 pounds since admit. Creatinine increased. Continue to hold Lasix. Decrease Carvedilol 3.125 mg bid due to hypotension. Check BMET in am. Will need HHPT upon discharge.    LOS: 13 days    CLEGG,AMY, NP 08/07/2011, 9:42 AM  Patient seen and examined with Darrick Grinder, NP. We discussed all aspects of the encounter. I agree with the assessment and plan as stated above. Volume status low. BP tenuous. CR up slightly. Agree with decreasing carvedilol. Also decrease spiro to 12.5 daily. Give 250 saline. If renal function stable plan d/c in am to home with aggressive PT (vs SNF). Will need close f/u in HF clinic to get her ready for CABG.   Mardell Suttles BensimhonMD 11:42 AM

## 2011-08-07 NOTE — Progress Notes (Addendum)
Physical Therapy Treatment Patient Details Name: Bridget Martinez MRN: [id number] DOB: [birth year] Today's Date: 08/07/2011  PT Assessment/Plan  PT - Assessment/Plan Comments on Treatment Session: Pt very limited due to low BP.  Pt BP in sitting 96/67 and decreased to 65/44 in standing.  Pt reports no dizziness with initial standing.  Attempted to take BP with 3 different machines but unable to get a read.  Pt began to c/o dizziness second stand and was returned to sitting.  Therefore session very limited due to fatigue and low BP.  Will check orthostatics next session prior to ambulation.  PT Plan: Frequency remains appropriate PT Frequency: Min 3X/week Follow Up Recommendations: Home health PT;Other (comment) (Rehab unable to admit pt at this time.) Equipment Recommended: Tub/shower seat^^^^^^ Antoine Poche, PT DPT 08/08/11 ^^^^^Summited treatment note from student and meant to change disposition on 08/07/11^^^^^ Plan to change disposition to SNF due to low BP and fatigue.  Pt would benefit from SNF for further strengthening and improve overall endurance.  Pt also would benefit from further monitoring of low BP with exercise that limits overall mobility.  Pt also has stairs to enter home and has not complete due to fatigue and low BP.  PT Goals  Acute Rehab PT Goals PT Goal Formulation: With patient Time For Goal Achievement: 7 days Pt will go Supine/Side to Sit: Independently PT Goal: Supine/Side to Sit - Progress: Other (comment) (Not tested) Pt will go Sit to Supine/Side: Independently;Other (comment) (Not tested) PT Goal: Sit to Supine/Side - Progress: Other (comment) (Not tested) Pt will Transfer Bed to Chair/Chair to Bed: with modified independence PT Transfer Goal: Bed to Chair/Chair to Bed - Progress: Progressing toward goal Pt will Ambulate: >150 feet;with modified independence;with least restrictive assistive device;with gait velocity >(comment) ft/second PT Goal: Ambulate -  Progress: Other (comment) (not tested) Pt will Go Up / Down Stairs: 3-5 stairs;with modified independence;with least restrictive assistive device PT Goal: Up/Down Stairs - Progress: Other (comment) (not tested)  PT Treatment Precautions/Restrictions  Precautions Precautions: Fall Required Braces or Orthoses: No Restrictions Weight Bearing Restrictions: No Mobility (including Balance) Bed Mobility Bed Mobility: No Transfers Transfers: Yes Sit to Stand: 4: Min assist Sit to Stand Details (indicate cue type and reason): VCs for hand placement.  (A) to maintain balance and safety with transfer. Stand to Sit: 4: Min assist Stand to Sit Details: (A) for slow descent and eccentric control.  Cues for hand placement.   Ambulation/Gait Ambulation/Gait: No Ambulation/Gait Assistance Details (indicate cue type and reason): Unable to ambulate at this time due decrease BP in standing.  BP decreased to possible 65/44.  Unsure of correct read.  Attempted three different BP machines and no manual cuff present.  RN notified and will keep monitoring BP.    Balance Balance Assessed: Yes Static Standing Balance Static Standing - Balance Support: No upper extremity supported Static Standing - Level of Assistance: 4: Min assist Static Standing - Comment/# of Minutes: ~62minute times 2; Pt limited time due to fatigue and lightheadness therefore pt returned to sitting.   Exercise    End of Session PT - End of Session Equipment Utilized During Treatment: Gait belt Activity Tolerance: Patient limited by fatigue Patient left: in chair;with call bell in reach;with family/visitor present Nurse Communication: Mobility status for transfers;Mobility status for ambulation General Behavior During Session: [facility] for tasks performed  Cierra Rothgeb 08/07/2011, 11:48 AM  [id number]

## 2011-08-07 NOTE — Progress Notes (Signed)
CARDIAC REHAB PHASE I   PRE:  Rate/Rhythm: 81Sr  BP:  Supine: 106/72  Sitting: 90/59  Standing: 60/44   SaO2: 97%RA  MODE:  Ambulation: 0 ft Too dizzy to walk  POST:  Rate/Rhythem:   BP:  Supine:   Sitting:   Standing:    SaO2:   Wrote consult per MI protocol. Attempted  ambulation without success. Pt too dizzy upon standing to attempt walk. BP 60/44 standing. Assisted back to lying.  [id number] Jeani Sow

## 2011-08-07 NOTE — Consult Note (Signed)
Will sign off from following for wound care as CHF NP has began to write wound care/compression wrap orders for pt. Thanks  Wynne Rozak [employer], [employer]

## 2011-08-07 NOTE — Progress Notes (Signed)
Subjective: Felt weak this morning.  Continues to have poor appetite.  Was dizzy this morning.  Objective: Vital signs in last 24 hours: Filed Vitals:   08/07/11 1005 08/07/11 1007 08/07/11 1030 08/07/11 1428  BP: 96/67 65/44 90/50  84/49  Pulse: 78  55 76  Temp:    97.7 F (36.5 C)  TempSrc:    Oral  Resp:    18  Height:      Weight:      SpO2: 95%   96%   Weight change: 0.1 kg (3.5 oz)  Intake/Output Summary (Last 24 hours) at 08/07/11 1724 Last data filed at 08/07/11 0500  Gross per 24 hour  Intake    120 ml  Output    100 ml  Net     20 ml   Physical Exam: General: Awake, Oriented, No acute distress. HEENT: EOMI. Neck: Supple CV: S1 and S2 Lungs: Clear to ascultation bilaterally Abdomen: Soft, Nontender, Nondistended, +bowel sounds. Ext: Good pulses. Trace edema.  Lab Results:  [facility] 08/07/11 0654 08/06/11 0500  NA 131* 131*  K 4.2 4.2  CL 91* 93*  CO2 24 28  GLUCOSE 302* 296*  BUN 24* 16  CREATININE 1.73* 1.43*  CALCIUM 9.2 9.5  MG -- 1.9  PHOS -- --    Basename 08/07/11 0654 08/06/11 1810  AST 38* 46*  ALT 29 36*  ALKPHOS 276* 318*  BILITOT 0.9 0.8  PROT 6.4 7.0  ALBUMIN 2.8* 2.9*    Basename 08/05/11 1130  LIPASE 37  AMYLASE 15    Basename 08/07/11 0654  WBC 10.0  NEUTROABS --  HGB 16.6*  HCT 48.2*  MCV 85.5  PLT 193    Basename 08/05/11 1400  CKTOTAL 26  CKMB 2.9  CKMBINDEX --  TROPONINI <0.30    Basename 08/05/11 0500  POCBNP 3577.0*   No results found for this basename: DDIMER:2 in the last 72 hours No results found for this basename: HGBA1C:2 in the last 72 hours No results found for this basename: CHOL:2,HDL:2,LDLCALC:2,TRIG:2,CHOLHDL:2,LDLDIRECT:2 in the last 72 hours No results found for this basename: TSH,T4TOTAL,FREET3,T3FREE,THYROIDAB in the last 72 hours No results found for this basename: VITAMINB12:2,FOLATE:2,FERRITIN:2,TIBC:2,IRON:2,RETICCTPCT:2 in the last 72 hours  Micro Results: Recent Results (from  the past 240 hour(s))  MRSA PCR SCREENING     Status: Normal   Collection Time   07/29/11  5:29 PM      Component Value Range Status Comment   MRSA by PCR NEGATIVE  NEGATIVE  Final     Studies/Results: Nm Gastric Emptying  08/06/2011  *RADIOLOGY REPORT*  Clinical Data:  Nausea and reflux.  NUCLEAR MEDICINE GASTRIC EMPTYING SCAN  Technique:  After oral ingestion of radiolabeled meal, sequential abdominal images were obtained for 120 minutes.  Residual percentage of activity remaining within the stomach was calculated at 60 and 120 minutes.  Radiopharmaceutical: 2.0 mCi Tc-67m sulfur colloid.  Comparison:  None  Findings: Corrected counts for assessment of residual radiolabeled meal in the stomach indicate the following percent remaining:  30 minutes:  85% remaining 60 minutes:  93% remaining (this higher count is likely due to distribution of meal in the stomach along with a single plane imaging rather than dual plane geometric mean calculation). 90 minutes:  72% remaining 120 minutes:  27% remaining  Normally, under 30% is remaining at 120 minutes.  IMPRESSION:  1.  Gastric emptying measures in the normal range at 120 minutes although it seems mildly slow to empty prior to the 120-minute mark. Accordingly  the study is considered technically normal but in the upper normal range for emptying time.  Original Report Authenticated By: Carron Curie, M.D.    Medications: I have reviewed the patient's current medications. Scheduled Meds:   . aspirin EC  325 mg Oral Daily  . carvedilol  3.125 mg Oral BID WC  . digoxin  0.125 mg Oral Daily  . enalapril  2.5 mg Oral BID WC  . enoxaparin (LOVENOX) injection  40 mg Subcutaneous Q24H  . escitalopram  20 mg Oral Daily  . insulin aspart  0-9 Units Subcutaneous TID WC  . insulin glargine  12 Units Subcutaneous QHS  . levothyroxine  100 mcg Oral Daily  . pantoprazole  40 mg Oral BID AC  . rosuvastatin  40 mg Oral q1800  . sodium chloride  250 mL  Intravenous Once  . spironolactone  12.5 mg Oral Daily  . DISCONTD: carvedilol  6.25 mg Oral BID WC  . DISCONTD: spironolactone  25 mg Oral Daily   Continuous Infusions:  PRN Meds:.acetaminophen, ondansetron (ZOFRAN) IV, sodium chloride  Assessment/Plan: Principal Problem:  *CAD, multiple vessel Active Problems:  HTN (hypertension)  Hyperlipidemia  Venous stasis ulcers  DKA (diabetic ketoacidoses)  Depression  Systolic CHF, acute on chronic  DM type 1 causing renal disease  Hypoglycemia  Acute renal failure  1. CAD, multiple vesselSystolic CHF, acute on chronic- Appreciate cardiology/CTVS input. Continue current management per cardiology. Follow work up per CTVS.   2.  Acute renal failure/dizziness, likely due to overdiuresis.  Patient was given gentle rehydration by cardiology.  Continue to monitor.  3. HTN (hypertension), with episodes of hypotension - likely due to overdiuresis continue to monitor for now.  Gentle fluid bolus.  Carvedilol and spironolactone dose decreased.  4. Hyperlipidemia- on crestor.   5. Venous stasis ulcers stable. Wound care appreciated.   6. Depression- stable.   7. DM insulin dependent- uncontrolled because patient did not receive a dose this morning.  Will not make any adjustments today.  Continue to monitor.  Will slowly increase Lantus, tendency to bottom out.   8. Nausea-No gastric emptying delay.  HIDA scan pending.  Liver function tests mildly elevated. Change protonix to bid dosing. Consider gi eval, depending on HIDA scan.  9. Prophylaxis.  Lovenox for DVT prophylaxis.  Pantoprazole for GI prophylaxis.  10.  Disposition pending.  Recommend skilled nursing facility placement as the patient needs intense rehabilitation to optimize the patient prior to CABG.   LOS: 13 days  Brigid Vandekamp A, MD 08/07/2011, 5:24 PM

## 2011-08-07 NOTE — Progress Notes (Signed)
Discussed in the long length of stay meeting Bridget Martinez Weeks 08/07/2011  

## 2011-08-07 NOTE — Progress Notes (Signed)
Clinical Social Work, 08/07/11, 1630:  CSW completed full assessment and placed in shadow chart.  Pt is agreeable to [facility]-SNF if her insurance with authorize it.  However, patient and son and concerns that patient is not medically ready for discharge because she is still having nausea and unable to eat and they do not know why.  CSW will fax out SNF search and follow up.[place], [place], [place]

## 2011-08-07 NOTE — Progress Notes (Addendum)
  08/06/11 11:42 Tomi Bamberger RN, BSN [phone] MD managing blood sugars, Cards is following,  plan is for CABG in two weeks, patient is not CIR candidate they rec hh  with 24/7 care vs snf. Plan is for hh services which needs to be set up.

## 2011-08-08 ENCOUNTER — Encounter (HOSPITAL_COMMUNITY): Payer: BC Managed Care – PPO

## 2011-08-08 ENCOUNTER — Inpatient Hospital Stay (HOSPITAL_COMMUNITY): Payer: BC Managed Care – PPO

## 2011-08-08 DIAGNOSIS — I5021 Acute systolic (congestive) heart failure: Secondary | ICD-10-CM

## 2011-08-08 LAB — BASIC METABOLIC PANEL
BUN: 32 mg/dL — ABNORMAL HIGH (ref 6–23)
Chloride: 93 mEq/L — ABNORMAL LOW (ref 96–112)
Glucose, Bld: 197 mg/dL — ABNORMAL HIGH (ref 70–99)
Potassium: 4.1 mEq/L (ref 3.5–5.1)

## 2011-08-08 LAB — CBC
HCT: 46.7 % — ABNORMAL HIGH (ref 36.0–46.0)
Hemoglobin: 15.7 g/dL — ABNORMAL HIGH (ref 12.0–15.0)
MCH: 28.7 pg (ref 26.0–34.0)
MCHC: 33.6 g/dL (ref 30.0–36.0)

## 2011-08-08 LAB — GLUCOSE, CAPILLARY

## 2011-08-08 MED ORDER — SINCALIDE 5 MCG IJ SOLR
0.0200 ug/kg | Freq: Once | INTRAMUSCULAR | Status: AC
  Administered 2011-08-08: 1.5 ug via INTRAVENOUS

## 2011-08-08 MED ORDER — SODIUM CHLORIDE 0.9 % IV BOLUS (SEPSIS)
250.0000 mL | Freq: Once | INTRAVENOUS | Status: AC
  Administered 2011-08-08: 250 mL via INTRAVENOUS

## 2011-08-08 MED ORDER — ENOXAPARIN SODIUM 30 MG/0.3ML ~~LOC~~ SOLN
30.0000 mg | SUBCUTANEOUS | Status: DC
  Administered 2011-08-09 – 2011-08-12 (×4): 30 mg via SUBCUTANEOUS
  Filled 2011-08-08 (×4): qty 0.3

## 2011-08-08 MED ORDER — TECHNETIUM TC 99M MEBROFENIN IV KIT
5.0000 | PACK | Freq: Once | INTRAVENOUS | Status: AC | PRN
  Administered 2011-08-08: 5 via INTRAVENOUS

## 2011-08-08 MED ORDER — SODIUM CHLORIDE 0.9 % IV BOLUS (SEPSIS)
500.0000 mL | Freq: Once | INTRAVENOUS | Status: AC
  Administered 2011-08-08: 500 mL via INTRAVENOUS

## 2011-08-08 NOTE — Progress Notes (Signed)
Clinical Social Work, 08/08/11, 1600:  CSW spoke with patient and son to give bed offers.  Patient was only willing to go to two facilities, one of which is unable to offer and one of which we are still waiting to hear back a decision from.  CSW to widen SNF search to [facility] and patient refused.  Patient and son were very upset and feel patient is not ready for discharge yet and did not want to discuss placement any further.  CSW informed MD of this information and will continue to follow.[place], [place], [place], [place]

## 2011-08-08 NOTE — Progress Notes (Signed)
CARDIAC REHAB PHASE I   PRE:  Rate/Rhythm: 78 SR  BP:  Supine: 102/50  Sitting: 90/48  Standing: 80/40   SaO2: 99 RA  MODE:  Ambulation: 264 ft   POST:  Rate/Rhythem: 81  BP:  Supine: 92/50  Sitting:   Standing:    SaO2: 99 RA 1422-1450 Assisted  X two and used walker to ambulate. Pt c/o of weakness but not of dizziness. BP down with standing and sitting. During walk no c/o . Back to bed after walk with call light in reach.  Deon Pilling

## 2011-08-08 NOTE — Progress Notes (Signed)
Subjective: Continues to have nausea.  Has poor appetite at times.  Denies any chest pain or shortness of breath.    Objective: Vital signs in last 24 hours: Filed Vitals:   08/08/11 0901 08/08/11 0905 08/08/11 1051 08/08/11 1300  BP: 100/56 100/56  108/69  Pulse:  78 75 74  Temp:    97.6 F (36.4 C)  TempSrc:    Oral  Resp:      Height:      Weight:      SpO2:    98%   Weight change: -0.33 kg (-11.6 oz)  Intake/Output Summary (Last 24 hours) at 08/08/11 1706 Last data filed at 08/08/11 1300  Gross per 24 hour  Intake     40 ml  Output     75 ml  Net    -35 ml   Physical Exam: General: Awake, Oriented, No acute distress. HEENT: EOMI. Neck: Supple CV: S1 and S2 Lungs: Clear to ascultation bilaterally Abdomen: Soft, Nontender, Nondistended, +bowel sounds. Ext: Good pulses. Trace edema.  Lab Results:  Basename 08/08/11 0500 08/07/11 0654 08/06/11 0500  NA 133* 131* --  K 4.1 4.2 --  CL 93* 91* --  CO2 27 24 --  GLUCOSE 197* 302* --  BUN 32* 24* --  CREATININE 2.15* 1.73* --  CALCIUM 9.2 9.2 --  MG 1.9 -- 1.9  PHOS -- -- --    Basename 08/07/11 0654 08/06/11 1810  AST 38* 46*  ALT 29 36*  ALKPHOS 276* 318*  BILITOT 0.9 0.8  PROT 6.4 7.0  ALBUMIN 2.8* 2.9*   No results found for this basename: LIPASE:2,AMYLASE:2 in the last 72 hours  Basename 08/08/11 0500 08/07/11 0654  WBC 8.2 10.0  NEUTROABS -- --  HGB 15.7* 16.6*  HCT 46.7* 48.2*  MCV 85.4 85.5  PLT 218 193   No results found for this basename: CKTOTAL:3,CKMB:3,CKMBINDEX:3,TROPONINI:3 in the last 72 hours No results found for this basename: POCBNP:3 in the last 72 hours No results found for this basename: DDIMER:2 in the last 72 hours No results found for this basename: HGBA1C:2 in the last 72 hours No results found for this basename: CHOL:2,HDL:2,LDLCALC:2,TRIG:2,CHOLHDL:2,LDLDIRECT:2 in the last 72 hours No results found for this basename: TSH,T4TOTAL,FREET3,T3FREE,THYROIDAB in the last 72  hours No results found for this basename: VITAMINB12:2,FOLATE:2,FERRITIN:2,TIBC:2,IRON:2,RETICCTPCT:2 in the last 72 hours  Micro Results: Recent Results (from the past 240 hour(s))  MRSA PCR SCREENING     Status: Normal   Collection Time   07/29/11  5:29 PM      Component Value Range Status Comment   MRSA by PCR NEGATIVE  NEGATIVE  Final     Studies/Results: Nm Hepato W/eject Fract  08/08/2011  *RADIOLOGY REPORT*  Clinical Data:  Abdominal pain and nausea.  NUCLEAR MEDICINE HEPATOBILIARY IMAGING WITH GALLBLADDER EF  Technique:  Sequential images of the abdomen were obtained out to 60 minutes following intravenous administration of radiopharmaceutical.  After slow intravenous infusion of 1.5 micrograms Cholecystokinin, gallbladder ejection fraction was determined.  Radiopharmaceutical:  5.0 mCi Tc-15m Choletec  Comparison:  abdominal ultrasound 08/01/2011.  Findings: There is symmetric uptake in the liver and prompt excretion into the biliary tree which is visualized by 10 minutes. The gallbladder is visualized at 20minutes.  Activity is noted in the small bowel at 50 minutes.  The patient received a protocoled infusion of CCK and a gallbladder ejection fraction was estimated at 13%.  Normal is greater than 30%.  The patient did not experience symptoms during CCK  infusion.  IMPRESSION:  1.  Normal biliary patency study. 2.  Low gallbladder ejection fraction at 13%.  Original Report Authenticated By: P. Kalman Jewels, M.D.    Medications: I have reviewed the patient's current medications. Scheduled Meds:    . aspirin EC  325 mg Oral Daily  . carvedilol  3.125 mg Oral BID WC  . digoxin  0.125 mg Oral Daily  . enalapril  2.5 mg Oral BID WC  . enoxaparin (LOVENOX) injection  30 mg Subcutaneous Q24H  . escitalopram  20 mg Oral Daily  . insulin aspart  0-9 Units Subcutaneous TID WC  . insulin glargine  12 Units Subcutaneous QHS  . levothyroxine  100 mcg Oral Daily  . pantoprazole  40 mg  Oral BID AC  . rosuvastatin  40 mg Oral q1800  . sincalide  0.02 mcg/kg Intravenous Once  . sodium chloride  250 mL Intravenous Once  . DISCONTD: enoxaparin (LOVENOX) injection  40 mg Subcutaneous Q24H  . DISCONTD: spironolactone  12.5 mg Oral Daily   Continuous Infusions:  PRN Meds:.acetaminophen, ondansetron (ZOFRAN) IV, sodium chloride, technetium TC 28M mebrofenin  Assessment/Plan: 1. CAD, multiple vessel, Systolic CHF, acute on chronic- Appreciate cardiology/CTVS input. Continue current management per cardiology. Follow work up per CTVS.   2.  Acute renal failure/dizziness, likely due to overdiuresis.  Patient was again given gentle rehydration by cardiology by IV today.  Continue to monitor renal function.  Spironolactone discontinued.  3. HTN (hypertension), with episodes of hypotension - likely due to overdiuresis continue to monitor for now.  Gentle fluid bolus.  Carvedilol decreased.  4. Hyperlipidemia- on crestor.   5. Venous stasis ulcers stable. Wound care appreciated.   6. Depression- stable.   7. DM insulin dependent-better controlled today.  Continue current regimen.   8. Nausea-No gastric emptying delay.  HIDA scan done today which showed normal biliary patency with low gallbladder ejection fraction.  Liver function tests mildly elevated. Change protonix to bid dosing.  Suspect patient's nausea is due to acute on chronic systolic heart failure.  9. Prophylaxis.  Lovenox for DVT prophylaxis.  Pantoprazole for GI prophylaxis.  10.  Disposition pending.  Patient would benefit from skilled nursing facility placement as the patient needs intense rehabilitation to optimize the patient prior to CABG. Talked to the patient's son and updated the son on the patient's condition.   LOS: 14 days  Dejanae Helser A, MD 08/08/2011, 5:06 PM

## 2011-08-08 NOTE — Progress Notes (Signed)
OT checked on patient again this evening.  Pt. C/o nausea and is trying to "get some dinner down" and "visit with her children".  Pt. In better spirits than this morning, but asks for OT to come back tomorrow.  OT to check back as time allows.   08/08/2011 Willo Yoon K. Beatrice-Mitchell, MS, OTR/L Occupational Therapist [facility] Phone: [phone] Pager: [phone] [email or web address]

## 2011-08-08 NOTE — Progress Notes (Signed)
OT Cancellation Note  Treatment cancelled today due to patient's refusal to participate.  Patients says she has several tests this morning and is waiting "for them to come and get her" and does "not want therapy right now".   OT to check back as time allows.  08/08/2011 Bridget Siwik K. Beatrice-Mitchell, MS, OTR/L Occupational Therapist [facility] Phone: [phone] Pager: [phone] [email or web address]

## 2011-08-08 NOTE — Progress Notes (Signed)
Subjective:   Blood pressure remains soft.  I/Os not recorded for 24 hours. Received  250cc yesterday. Lasix on hold.  Carvedilol and Spironolactone decreased. Weight stable. Ambulating in room denies SOB.   Feels ok. Denies CP/SOB/PND/Orthopnea. Denies dizziness. Denies nausea.   Husband concerned about poor glucose control   Intake/Output Summary (Last 24 hours) at 08/08/11 1704 Last data filed at 08/08/11 1300  Gross per 24 hour  Intake     40 ml  Output     75 ml  Net    -35 ml    Current meds:    . aspirin EC  325 mg Oral Daily  . carvedilol  3.125 mg Oral BID WC  . digoxin  0.125 mg Oral Daily  . enalapril  2.5 mg Oral BID WC  . enoxaparin (LOVENOX) injection  30 mg Subcutaneous Q24H  . escitalopram  20 mg Oral Daily  . insulin aspart  0-9 Units Subcutaneous TID WC  . insulin glargine  12 Units Subcutaneous QHS  . levothyroxine  100 mcg Oral Daily  . pantoprazole  40 mg Oral BID AC  . rosuvastatin  40 mg Oral q1800  . sincalide  0.02 mcg/kg Intravenous Once  . sodium chloride  250 mL Intravenous Once  . DISCONTD: enoxaparin (LOVENOX) injection  40 mg Subcutaneous Q24H  . DISCONTD: spironolactone  12.5 mg Oral Daily   Infusions:     Objective:  Blood pressure 108/69, pulse 74, temperature 97.6 F (36.4 C), temperature source Oral, resp. rate 18, height 5\' 7"  (1.702 m), weight 75.07 kg (165 lb 8 oz), SpO2 98.00%. Weight change: -0.33 kg (-11.6 oz)  Physical Exam: General:  Well appearing. No resp difficulty HEENT: normal Neck: supple. JVP flat. Carotids 2+ bilat; no bruits. No lymphadenopathy or thryomegaly appreciated. Cor: PMI nondisplaced. Regular rate & rhythm. No rubs, gallops or murmurs. Lungs: clear Abdomen: soft, nontender, nondistended. No hepatosplenomegaly. No bruits or masses. Good bowel sounds. Extremities: no cyanosis, clubbing, rash, edema Neuro: alert & orientedx3, cranial nerves grossly intact. moves all 4 extremities w/o difficulty. Affect  pleasant  Telemetry: SR  Lab Results: Basic Metabolic Panel:  Lab [id number] 0500 08/07/11 0654 08/06/11 0500 08/05/11 0500 08/04/11 0406 08/03/11 0406 08/02/11 0446  NA 133* 131* 131* 134* 130* -- --  K 4.1 4.2 -- -- -- -- --  CL 93* 91* 93* 95* 91* -- --  CO2 27 24 28 30 31  -- --  GLUCOSE 197* 302* 296* 160* 258* -- --  BUN 32* 24* 16 13 14  -- --  CREATININE 2.15* 1.73* 1.43* 1.15* 1.07 -- --  CALCIUM 9.2 9.2 9.5 9.1 8.9 -- --  MG 1.9 -- 1.9 -- -- 2.1 1.6  PHOS -- -- -- -- -- -- 3.3   Liver Function Tests:  Lab 08/07/11 0654 08/06/11 1810 08/02/11 0446  AST 38* 46* 42*  ALT 29 36* 35  ALKPHOS 276* 318* 273*  BILITOT 0.9 0.8 0.9  PROT 6.4 7.0 5.4*  ALBUMIN 2.8* 2.9* 2.4*    Lab 08/05/11 1130  LIPASE 37  AMYLASE 15   No results found for this basename: AMMONIA:5 in the last 168 hours CBC:  Lab 08/08/11 0500 08/07/11 0654 08/04/11 0406 08/02/11 0446  WBC 8.2 10.0 7.0 6.3  NEUTROABS -- -- -- --  HGB 15.7* 16.6* 15.4* 14.1  HCT 46.7* 48.2* 44.2 42.0  MCV 85.4 85.5 85.0 86.1  PLT 218 193 182 180   Cardiac Enzymes:  Lab 08/05/11 1400  CKTOTAL 26  CKMB 2.9  CKMBINDEX --  TROPONINI <0.30   BNP:  Lab 08/05/11 0500 08/03/11 0406  POCBNP 3577.0* 2811.0*   CBG:  Lab 08/08/11 0624 08/07/11 2126 08/07/11 1654 08/07/11 1115 08/07/11 0636  GLUCAP 194* 191* 361* 479* 254*   Microbiology: No results found for this basename: cult   No results found for this basename: CULT:2,SDES:2 in the last 168 hours  Imaging: Nm Hepato W/eject Fract  08/08/2011  *RADIOLOGY REPORT*  Clinical Data:  Abdominal pain and nausea.  NUCLEAR MEDICINE HEPATOBILIARY IMAGING WITH GALLBLADDER EF  Technique:  Sequential images of the abdomen were obtained out to 60 minutes following intravenous administration of radiopharmaceutical.  After slow intravenous infusion of 1.5 micrograms Cholecystokinin, gallbladder ejection fraction was determined.  Radiopharmaceutical:  5.0 mCi Tc-70m Choletec   Comparison:  abdominal ultrasound 08/01/2011.  Findings: There is symmetric uptake in the liver and prompt excretion into the biliary tree which is visualized by 10 minutes. The gallbladder is visualized at 70minutes.  Activity is noted in the small bowel at 50 minutes.  The patient received a protocoled infusion of CCK and a gallbladder ejection fraction was estimated at 13%.  Normal is greater than 30%.  The patient did not experience symptoms during CCK infusion.  IMPRESSION:  1.  Normal biliary patency study. 2.  Low gallbladder ejection fraction at 13%.  Original Report Authenticated By: P. Kalman Jewels, M.D.     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 Lasix now off several days. However renal function continues to deteriorate. Will stop spironolactone and give 500cc NS. (already got 250) Need for strict I/Os discussed with staff. Check co-ox in am. Check UA and cx for dysuria.    LOS: 14 days    Glori Bickers, MD 08/08/2011, 5:04 PM

## 2011-08-08 NOTE — Progress Notes (Signed)
Physical Therapy Cancellation Patient Details Name: Bridget Martinez MRN: [id number] DOB: 1951/04/08 Today's Date: 08/08/2011   PT treatment cancelled today secondary to pt just returning from ambulating 264 feet with cardiac rehab.  Will check back tomorrow! Thanks!  Ruel Favors 08/08/2011, 3:08 PM  Antoine Poche, [place] DPT [phone]

## 2011-08-09 DIAGNOSIS — N390 Urinary tract infection, site not specified: Secondary | ICD-10-CM | POA: Diagnosis not present

## 2011-08-09 DIAGNOSIS — E1049 Type 1 diabetes mellitus with other diabetic neurological complication: Secondary | ICD-10-CM | POA: Diagnosis present

## 2011-08-09 DIAGNOSIS — Z01810 Encounter for preprocedural cardiovascular examination: Secondary | ICD-10-CM

## 2011-08-09 LAB — URINALYSIS, ROUTINE W REFLEX MICROSCOPIC
Ketones, ur: 15 mg/dL — AB
Protein, ur: 100 mg/dL — AB
Urobilinogen, UA: 1 mg/dL (ref 0.0–1.0)

## 2011-08-09 LAB — URINE MICROSCOPIC-ADD ON

## 2011-08-09 LAB — GLUCOSE, CAPILLARY: Glucose-Capillary: 320 mg/dL — ABNORMAL HIGH (ref 70–99)

## 2011-08-09 LAB — CBC
HCT: 43.6 % (ref 36.0–46.0)
MCHC: 33.9 g/dL (ref 30.0–36.0)
RDW: 17.5 % — ABNORMAL HIGH (ref 11.5–15.5)

## 2011-08-09 LAB — BASIC METABOLIC PANEL
BUN: 32 mg/dL — ABNORMAL HIGH (ref 6–23)
Chloride: 97 mEq/L (ref 96–112)
Creatinine, Ser: 1.99 mg/dL — ABNORMAL HIGH (ref 0.50–1.10)
GFR calc Af Amer: 30 mL/min — ABNORMAL LOW (ref >90)
GFR calc non Af Amer: 26 mL/min — ABNORMAL LOW (ref >90)

## 2011-08-09 LAB — CARBOXYHEMOGLOBIN: Total hemoglobin: 15.8 g/dL (ref 12.5–16.0)

## 2011-08-09 MED ORDER — INSULIN ASPART 100 UNIT/ML ~~LOC~~ SOLN
3.0000 [IU] | Freq: Three times a day (TID) | SUBCUTANEOUS | Status: DC
  Administered 2011-08-10 (×2): 4 [IU] via SUBCUTANEOUS
  Administered 2011-08-10: 5 [IU] via SUBCUTANEOUS
  Administered 2011-08-11: 4 [IU] via SUBCUTANEOUS
  Administered 2011-08-11: 7 [IU] via SUBCUTANEOUS
  Administered 2011-08-11 – 2011-08-12 (×2): 6 [IU] via SUBCUTANEOUS
  Administered 2011-08-12: 5 [IU] via SUBCUTANEOUS

## 2011-08-09 MED ORDER — CIPROFLOXACIN HCL 250 MG PO TABS
250.0000 mg | ORAL_TABLET | Freq: Two times a day (BID) | ORAL | Status: DC
  Administered 2011-08-09 – 2011-08-12 (×6): 250 mg via ORAL
  Filled 2011-08-09 (×8): qty 1

## 2011-08-09 MED ORDER — INSULIN DETEMIR 100 UNIT/ML ~~LOC~~ SOLN
8.0000 [IU] | Freq: Two times a day (BID) | SUBCUTANEOUS | Status: DC
  Administered 2011-08-09 – 2011-08-11 (×4): 8 [IU] via SUBCUTANEOUS
  Filled 2011-08-09: qty 3

## 2011-08-09 NOTE — Progress Notes (Addendum)
Subjective: Continues to have nausea.  Has poor appetite at times.  Denies any chest pain or shortness of breath.    Objective: Vital signs in last 24 hours: Filed Vitals:   08/09/11 0833 08/09/11 0835 08/09/11 1054 08/09/11 1130  BP: 106/50 106/50    Pulse: 73 73 82 85  Temp:      TempSrc:      Resp:  18    Height:      Weight:      SpO2:  98%     Weight change: -0.27 kg (-9.5 oz)  Intake/Output Summary (Last 24 hours) at 08/09/11 1456 Last data filed at 08/09/11 0835  Gross per 24 hour  Intake   1055 ml  Output    651 ml  Net    404 ml   Physical Exam: General: Awake, Oriented, No acute distress. HEENT: EOMI. Neck: Supple CV: S1 and S2 Lungs: Clear to ascultation bilaterally Abdomen: Soft, Nontender, Nondistended, +bowel sounds. Ext: Good pulses. Trace edema.  Lab Results:  Basename 08/09/11 0540 08/08/11 0500  NA 133* 133*  K 3.7 4.1  CL 97 93*  CO2 26 27  GLUCOSE 230* 197*  BUN 32* 32*  CREATININE 1.99* 2.15*  CALCIUM 8.8 9.2  MG -- 1.9  PHOS -- --    Basename 08/07/11 0654 08/06/11 1810  AST 38* 46*  ALT 29 36*  ALKPHOS 276* 318*  BILITOT 0.9 0.8  PROT 6.4 7.0  ALBUMIN 2.8* 2.9*   No results found for this basename: LIPASE:2,AMYLASE:2 in the last 72 hours  Basename 08/09/11 0540 08/08/11 0500  WBC 8.1 8.2  NEUTROABS -- --  HGB 14.8 15.7*  HCT 43.6 46.7*  MCV 85.2 85.4  PLT 225 218   No results found for this basename: CKTOTAL:3,CKMB:3,CKMBINDEX:3,TROPONINI:3 in the last 72 hours No results found for this basename: POCBNP:3 in the last 72 hours No results found for this basename: DDIMER:2 in the last 72 hours No results found for this basename: HGBA1C:2 in the last 72 hours No results found for this basename: CHOL:2,HDL:2,LDLCALC:2,TRIG:2,CHOLHDL:2,LDLDIRECT:2 in the last 72 hours No results found for this basename: TSH,T4TOTAL,FREET3,T3FREE,THYROIDAB in the last 72 hours No results found for this basename:  VITAMINB12:2,FOLATE:2,FERRITIN:2,TIBC:2,IRON:2,RETICCTPCT:2 in the last 72 hours  Micro Results: No results found for this or any previous visit (from the past 240 hour(s)).  Studies/Results: Nm Hepato W/eject Fract  08/08/2011  *RADIOLOGY REPORT*  Clinical Data:  Abdominal pain and nausea.  NUCLEAR MEDICINE HEPATOBILIARY IMAGING WITH GALLBLADDER EF  Technique:  Sequential images of the abdomen were obtained out to 60 minutes following intravenous administration of radiopharmaceutical.  After slow intravenous infusion of 1.5 micrograms Cholecystokinin, gallbladder ejection fraction was determined.  Radiopharmaceutical:  5.0 mCi Tc-5m Choletec  Comparison:  abdominal ultrasound 08/01/2011.  Findings: There is symmetric uptake in the liver and prompt excretion into the biliary tree which is visualized by 10 minutes. The gallbladder is visualized at 36minutes.  Activity is noted in the small bowel at 50 minutes.  The patient received a protocoled infusion of CCK and a gallbladder ejection fraction was estimated at 13%.  Normal is greater than 30%.  The patient did not experience symptoms during CCK infusion.  IMPRESSION:  1.  Normal biliary patency study. 2.  Low gallbladder ejection fraction at 13%.  Original Report Authenticated By: P. Kalman Jewels, M.D.    Medications: I have reviewed the patient's current medications. Scheduled Meds:    . aspirin EC  325 mg Oral Daily  . carvedilol  3.125 mg Oral BID WC  . digoxin  0.125 mg Oral Daily  . enalapril  2.5 mg Oral BID WC  . enoxaparin (LOVENOX) injection  30 mg Subcutaneous Q24H  . escitalopram  20 mg Oral Daily  . insulin aspart  0-9 Units Subcutaneous TID WC  . insulin glargine  12 Units Subcutaneous QHS  . levothyroxine  100 mcg Oral Daily  . pantoprazole  40 mg Oral BID AC  . rosuvastatin  40 mg Oral q1800  . sodium chloride  500 mL Intravenous Once   Continuous Infusions:  PRN Meds:.acetaminophen, ondansetron (ZOFRAN) IV, sodium  chloride  Assessment/Plan: 1. CAD, multiple vessel, Systolic CHF, acute on chronic- Appreciate cardiology/CTVS input. Continue current management per cardiology. Work up per [employer].   2.  Acute renal failure, likely due to overdiuresis.  Renal function improved after gentle hydration. Continue to monitor renal function.  Diuretics continued to be held.  3. HTN (hypertension), with episodes of hypotension - improved likely due to overdiuresis.  On low dose carvedilol.  4. Hyperlipidemia- on crestor.   5. Venous stasis ulcers stable. Wound care appreciated.   6. Depression - stable.   7. DM insulin dependent - stable.  Continue current regimen.  Will likely benefit from outpatient endocrinology evaluation.  Blood pressures labile likely due to poor appetite and nausea.  8. Nausea- No gastric emptying delay.  HIDA scan showed normal biliary patency with low gallbladder ejection fraction.  Liver function tests mildly elevated. Change protonix to bid dosing.  Suspect patient's nausea is due to acute on chronic systolic heart failure and low EF.  9. Prophylaxis.  Lovenox for DVT prophylaxis.  Pantoprazole for GI prophylaxis.  10.  Disposition pending improvement in renal function.  Patient would benefit from skilled nursing facility placement as the patient needs intense rehabilitation to optimize the patient prior to CABG. Talked to the patient's husband and updated him.  11. UTI. Was started on cipro. Change abx based on urine culture.   LOS: 15 days  Ryver Zadrozny A, MD 08/09/2011, 2:56 PM

## 2011-08-09 NOTE — Plan of Care (Signed)
Problem: Phase I Progression Outcomes Goal: EF % per last Echo/documented,Core Reminder form on chart Outcome: Completed/Met Date Met:  08/08/11 Ef 25 to 30%

## 2011-08-09 NOTE — Progress Notes (Signed)
08/09/11 1123 Nursing Note: Hat placed in toilet due to strict intake and output. Pt educated on importance of urinating in hat placed in toilet. Pt stated understanding. However, pt did not urinate in hat. Pt continued to move hat and urinate in toilet. Nurse educated pt again on importance of measuring urine. Pt stated understanding. Reported to night nurse to ensure pt understands.  Will continue to monitor and educate pt.

## 2011-08-09 NOTE — Progress Notes (Signed)
Ortho techs notified of pt's BLE need unboots dsg. Rewrapped.

## 2011-08-09 NOTE — Progress Notes (Signed)
Seen and agree with SPT note Lanetta Inch, [place]

## 2011-08-09 NOTE — Progress Notes (Signed)
PCP- Hasanaj  08/09/11- 1630- Marvetta Gibbons RN, BSN [phone] per CSW [facility] does not have any beds- pt does not want any other of the bed offers given her. At this time pt now wanting to go home with [facility]. Will have weekend CM follow up with pt regarding [facility] for discharge.

## 2011-08-09 NOTE — Progress Notes (Signed)
Physical Therapy Treatment Patient Details Name: Bridget Martinez MRN: [id number] DOB: 11/07/1950 Today's Date: 08/09/2011  PT Assessment/Plan  PT - Assessment/Plan Comments on Treatment Session: Pt. tolerated today's treatment very well.  Was willing to attempt ambulation without RW, but is much more steady and comfortable with use of RW.  Pt. had no c/o dizziness, with HR= 85 during ambulation and O2 97%.  Discussed discharge options at completion of session.  Pt. still unsure if she prefers SNF over HHPT, but  states that she is leaning towards HHPT, which is an appropriate option.  PT Plan: Discharge plan remains appropriate Follow Up Recommendations: Home health PT PT Goals  Acute Rehab PT Goals PT Goal: Supine/Side to Sit - Progress: Other (comment) (Not addressed today. ) PT Goal: Sit to Supine/Side - Progress: Other (comment) (Not addressed today. ) PT Transfer Goal: Bed to Chair/Chair to Bed - Progress: Progressing toward goal PT Goal: Ambulate - Progress: Progressing toward goal PT Goal: Up/Down Stairs - Progress: Other (comment) (Not addressed today.)  PT Treatment Precautions/Restrictions  Precautions Precautions: Fall Required Braces or Orthoses: No Restrictions Weight Bearing Restrictions: No Mobility (including Balance) Bed Mobility Bed Mobility: No Transfers Transfers: Yes Sit to Stand: 5: Supervision;From chair/3-in-1;From toilet Sit to Stand Details (indicate cue type and reason): Verbal cues for hand placement.  Supervision for safety.  Stand to Sit: 5: Supervision;To toilet;To chair/3-in-1 Stand to Sit Details: Verbal cues for hand placement.  Supervision for safety.  Ambulation/Gait Ambulation/Gait: Yes Ambulation/Gait Assistance: 5: Supervision;4: Min assist Ambulation/Gait Assistance Details (indicate cue type and reason): Supervision with RW, min assist for last 50 feet without use of RW.  Ambulation Distance (Feet): 260 Feet Assistive device: Rolling  walker Gait Pattern: Step-through pattern (Mildly unsteady. )    Exercise  General Exercises - Lower Extremity Long Arc Quad: AROM;Both;15 reps;Seated Hip Flexion/Marching: AROM;Other reps (comment);Seated (15 reps) End of Session PT - End of Session Equipment Utilized During Treatment: Gait belt Activity Tolerance: Patient tolerated treatment well Patient left: in chair;with call bell in reach Nurse Communication: Mobility status for transfers;Mobility status for ambulation General Behavior During Session: [facility] for tasks performed Cognition: [facility] for tasks performed  Barney Drain, SPT  08/09/2011, 12:47 PM

## 2011-08-09 NOTE — Progress Notes (Signed)
Clinical Social Work, 08/09/11, 1620:  CSW explained to patient that the SNF she was interested in currently has no openings. CSW also informed pt that the SNF said pt would owe %40 of her co-pay.  Patient states she has decided against going to a facility.  Patient had to leave to a procedure, but CSW will follow up with patient on Monday to assist with any further needs.[place], [place], [place], [place]

## 2011-08-09 NOTE — Progress Notes (Signed)
Utilization review complete 

## 2011-08-09 NOTE — Progress Notes (Signed)
Pt for doppler study unable to have it done with UNA boots to BLE.Amy RN with HF clinic informed and instructed to remove dsgs and have ortho tecs rewrap BLE after .

## 2011-08-09 NOTE — Progress Notes (Signed)
Subjective:  60 y/o woman with DM2. Admitted 2 weeks ago with NSTEMI. Found to have EF 25% with severe CAD and cardiogenic shock. Treated with milrinone and aggressive diuresis. Now down 49 pounds. However. Continued to diurese well after diuretics stopped and developed ATN. Now improving. Plan is to d/c with home health/PT and return for CABG (Dr. PVT) when stronger and superficial leg ulcers improved.   Received 750cc bolus NS yesterday. BP improved. Spironolactone stopped yesterday renal function improving. Weight decreased 49 pounds since admit.   Complains of intermittent nausea and dysuria. UA pending. Gastric emptying study negative. HIDA scan mildly delayed.  Denies SOB/Orthopnea/PND/CP. Denies dizziness. Glucose still labile.   Patient and her husband very frustrated about nausea, anorexia and labile blood sugars.   UA + for UTI    Intake/Output Summary (Last 24 hours) at 08/09/11 0839 Last data filed at 08/09/11 0700  Gross per 24 hour  Intake   1265 ml  Output    626 ml  Net    639 ml    Current meds:    . aspirin EC  325 mg Oral Daily  . carvedilol  3.125 mg Oral BID WC  . digoxin  0.125 mg Oral Daily  . enalapril  2.5 mg Oral BID WC  . enoxaparin (LOVENOX) injection  30 mg Subcutaneous Q24H  . escitalopram  20 mg Oral Daily  . insulin aspart  0-9 Units Subcutaneous TID WC  . insulin glargine  12 Units Subcutaneous QHS  . levothyroxine  100 mcg Oral Daily  . pantoprazole  40 mg Oral BID AC  . rosuvastatin  40 mg Oral q1800  . sincalide  0.02 mcg/kg Intravenous Once  . sodium chloride  250 mL Intravenous Once  . sodium chloride  500 mL Intravenous Once  . DISCONTD: enoxaparin (LOVENOX) injection  40 mg Subcutaneous Q24H  . DISCONTD: spironolactone  12.5 mg Oral Daily   Infusions:     Objective:  Blood pressure 106/50, pulse 73, temperature 98.2 F (36.8 C), temperature source Oral, resp. rate 16, height 5\' 7"  (1.702 m), weight 74.8 kg (164 lb 14.5 oz), SpO2  98.00%. Weight change: -0.27 kg (-9.5 oz)  Physical Exam: General:  Well appearing. No resp difficulty HEENT: normal Neck: supple. JVP 6-7. Carotids 2+ bilat; no bruits. No lymphadenopathy or thryomegaly appreciated. Cor: PMI nondisplaced. Regular rate & rhythm. No rubs, gallops or murmurs. Lungs: clear Abdomen: soft, nontender, nondistended. No hepatosplenomegaly. No bruits or masses. Good bowel sounds. Extremities: no cyanosis, clubbing, rash, R and LLE unna boots in place.  Neuro: alert & orientedx3, cranial nerves grossly intact. moves all 4 extremities w/o difficulty. Affect pleasant  Telemetry: SR  Lab Results: Basic Metabolic Panel:  Lab [id number] 08/08/11 0500 08/07/11 0654 08/06/11 0500 08/05/11 0500 08/03/11 0406  NA 133* 133* 131* 131* 134* --  K 3.7 4.1 -- -- -- --  CL 97 93* 91* 93* 95* --  CO2 26 27 24 28 30  --  GLUCOSE 230* 197* 302* 296* 160* --  BUN 32* 32* 24* 16 13 --  CREATININE 1.99* 2.15* 1.73* 1.43* 1.15* --  CALCIUM 8.8 9.2 9.2 9.5 9.1 --  MG -- 1.9 -- 1.9 -- 2.1  PHOS -- -- -- -- -- --   Liver Function Tests:  Lab 08/07/11 0654 08/06/11 1810  AST 38* 46*  ALT 29 36*  ALKPHOS 276* 318*  BILITOT 0.9 0.8  PROT 6.4 7.0  ALBUMIN 2.8* 2.9*    Lab 08/05/11 1130  LIPASE 37  AMYLASE 15   No results found for this basename: AMMONIA:5 in the last 168 hours CBC:  Lab 08/09/11 0540 08/08/11 0500 08/07/11 0654 08/04/11 0406  WBC 8.1 8.2 10.0 7.0  NEUTROABS -- -- -- --  HGB 14.8 15.7* 16.6* 15.4*  HCT 43.6 46.7* 48.2* 44.2  MCV 85.2 85.4 85.5 85.0  PLT 225 218 193 182   Cardiac Enzymes:  Lab 08/05/11 1400  CKTOTAL 26  CKMB 2.9  CKMBINDEX --  TROPONINI <0.30   BNP:  Lab 08/05/11 0500 08/03/11 0406  POCBNP 3577.0* 2811.0*   CBG:  Lab 08/08/11 2222 08/08/11 1705 08/08/11 0624 08/07/11 2126 08/07/11 1654  GLUCAP 274* 381* 194* 191* 361*   Microbiology: No results found for this basename: cult   No results found for this basename:  CULT:2,SDES:2 in the last 168 hours  Imaging: Nm Hepato W/eject Fract  08/08/2011  *RADIOLOGY REPORT*  Clinical Data:  Abdominal pain and nausea.  NUCLEAR MEDICINE HEPATOBILIARY IMAGING WITH GALLBLADDER EF  Technique:  Sequential images of the abdomen were obtained out to 60 minutes following intravenous administration of radiopharmaceutical.  After slow intravenous infusion of 1.5 micrograms Cholecystokinin, gallbladder ejection fraction was determined.  Radiopharmaceutical:  5.0 mCi Tc-34m Choletec  Comparison:  abdominal ultrasound 08/01/2011.  Findings: There is symmetric uptake in the liver and prompt excretion into the biliary tree which is visualized by 10 minutes. The gallbladder is visualized at 32minutes.  Activity is noted in the small bowel at 50 minutes.  The patient received a protocoled infusion of CCK and a gallbladder ejection fraction was estimated at 13%.  Normal is greater than 30%.  The patient did not experience symptoms during CCK infusion.  IMPRESSION:  1.  Normal biliary patency study. 2.  Low gallbladder ejection fraction at 13%.  Original Report Authenticated By: P. Kalman Jewels, M.D.     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 Off diuretics. Renal function improving. Continue strict I/O. UA pending. CO-OX pending.    LOS: 15 days    CLEGG,AMY, NP 08/09/2011, 8:39 AM  Stabilizing from a cardiac perspective. Continue to hold diuretics. Ok for d/c from cardiac perspecive with close outpatient f/u in HF clinic. However would consider endocrine consult to help with labile DM2. Primary team to address UTI.   Endrit Gittins  BensimhonMD 10:16 AM

## 2011-08-09 NOTE — Progress Notes (Signed)
*  PRELIMINARY RESULTS* ABIs and Doppler waveforms are within normal limits bilaterally.  Palmar arch evaluation -  Doppler waveforms remained normal with both radial and ulnar compression.  Bilateral lower extremity vein mapping completed. Greater saphenous runs deep and medial in the mid to distal thigh bilaterally. Vein is patent and compressible throughout.  [place], [place] 08/09/2011, 10:40 PM

## 2011-08-09 NOTE — Consult Note (Signed)
Reason for Consult: Uncontrolled type 1 diabetes mellitus Referring Physician: Dr. Isabella Bowens is an 60 y.o. female.  HPI: She was diagnosed with type 1 diabetes mellitus in 59 at age 33. Complications include chronic numbness and tingling in the hands and feet.  She and her son describe her as "a model diabetic" until a few years ago.  She describes considerable trouble with hypoglycemia particularly overnight.  She has also developed diabetic ketoacidosis in the past.  Lately she has been using Levemir Flexpen 25 units subcutaneous each morning and Humalog Kwikpen 5 units at each meal with a scale (approximately one extra unit for every 30 mg/dL above 100 mg/dL).  She rarely uses the bedtime dose of Levemir, otherwise she would have hypoglycemia more often.  She estimates eating 4 carb exchanges at meals (about 60 grams of carbs).  With 5 units of Humalog for those meals, her blood glucose rises by the afternoons.   In recent months, she has experienced considerable nausea with associated anorexia and weight gain. She was diagnosed with heart failure and describes weight loss of 50 pounds due to diuresis during this hospital stay.  She is eating about half of her meals during this hospital stay--including a meal at supper today which would have provided 62 g of carbs if she had completed that meal. She has not recently visited an ophthalmologist, optometrist, registered dietitian, or certified diabetes educator.  She has not used an insulin pump or continuous glucose monitor.    Past Medical History  Diagnosis Date  . Diabetes mellitus     on insulin, with h/o DKA   . HTN (hypertension)   . Hyperlipidemia   . Venous stasis ulcers   . Q waves suggestive of previous myocardial infarction     Pt denies h/o AMI, cath, PCI  . Angina   . Tuberculosis   . Hyperthyroidism     Past Surgical History  Procedure Date  . No past surgeries   . Coronary artery bypass graft 07/29/2011   Procedure: CORONARY ARTERY BYPASS GRAFTING (CABG);  Surgeon: Tharon Aquas Adelene Idler, MD;  Location: [facility];  Service: Open Heart Surgery;  Laterality: N/A;    Family History  Problem Relation Age of Onset  . Heart disease Father   . Hypertension Mother   . Multiple sclerosis Father    Her mother had hyperthyroidism.  Multiple maternal cousins had type 2 diabetes mellitus. One maternal uncle died of complications related to his type 1 diabetes mellitus.   Social History:  reports that she has never smoked. She does not have any smokeless tobacco history on file. She reports that she does not drink alcohol or use illicit drugs.  She is accompanied by her son, Rush Landmark, and her son's girlfriend.  Allergies:  Allergies  Allergen Reactions  . Sulfa Antibiotics Anaphylaxis and Swelling    Medications:  I have reviewed the patient's current medications. Prior to Admission:  Prescriptions prior to admission  Medication Sig Dispense Refill  . atorvastatin (LIPITOR) 20 MG tablet Take by mouth daily. Pt doesn't know dose       . escitalopram (LEXAPRO) 10 MG tablet Take 10 mg by mouth daily.        . insulin detemir (LEVEMIR) 100 UNIT/ML injection Inject 25 Units into the skin every morning.        . insulin lispro (HUMALOG) 100 UNIT/ML injection Inject 5 Units into the skin 3 (three) times daily before meals.        Marland Kitchen  levothyroxine (SYNTHROID, LEVOTHROID) 100 MCG tablet Take 100 mcg by mouth daily.        . metFORMIN (GLUCOPHAGE) 1000 MG tablet Take 1,000 mg by mouth 2 (two) times daily with a meal.        . PRESCRIPTION MEDICATION Blood pressure medication        . PRESCRIPTION MEDICATION Cholesterol medication       . ramipril (ALTACE) 10 MG capsule Take by mouth daily. Pt doesn't know dose      . insulin detemir (LEVEMIR) 100 UNIT/ML injection Inject 20 Units into the skin at bedtime as needed. For high blood sugar        Scheduled:   . aspirin EC  325 mg Oral Daily  . carvedilol  3.125 mg  Oral BID WC  . ciprofloxacin  250 mg Oral BID  . digoxin  0.125 mg Oral Daily  . enalapril  2.5 mg Oral BID WC  . enoxaparin (LOVENOX) injection  30 mg Subcutaneous Q24H  . escitalopram  20 mg Oral Daily  . insulin aspart  3-10 Units Subcutaneous TID WC  . insulin detemir  8 Units Subcutaneous BID  . levothyroxine  100 mcg Oral Daily  . pantoprazole  40 mg Oral BID AC  . rosuvastatin  40 mg Oral q1800  . DISCONTD: insulin aspart  0-9 Units Subcutaneous TID WC  . DISCONTD: insulin glargine  12 Units Subcutaneous QHS    Results for orders placed during the hospital encounter of 07/25/11 (from the past 48 hour(s))  GLUCOSE, CAPILLARY     Status: Abnormal   Collection Time   08/07/11  9:26 PM      Component Value Range Comment   Glucose-Capillary 191 (*) 70 - 99 (mg/dL)    Comment 1 Notify RN     BASIC METABOLIC PANEL     Status: Abnormal   Collection Time   08/08/11  5:00 AM      Component Value Range Comment   Sodium 133 (*) 135 - 145 (mEq/L)    Potassium 4.1  3.5 - 5.1 (mEq/L)    Chloride 93 (*) 96 - 112 (mEq/L)    CO2 27  19 - 32 (mEq/L)    Glucose, Bld 197 (*) 70 - 99 (mg/dL)    BUN 32 (*) 6 - 23 (mg/dL)    Creatinine, Ser 2.15 (*) 0.50 - 1.10 (mg/dL)    Calcium 9.2  8.4 - 10.5 (mg/dL)    GFR calc non Af Amer 24 (*) >90 (mL/min)    GFR calc Af Amer 27 (*) >90 (mL/min)   CBC     Status: Abnormal   Collection Time   08/08/11  5:00 AM      Component Value Range Comment   WBC 8.2  4.0 - 10.5 (K/uL)    RBC 5.47 (*) 3.87 - 5.11 (MIL/uL)    Hemoglobin 15.7 (*) 12.0 - 15.0 (g/dL)    HCT 46.7 (*) 36.0 - 46.0 (%)    MCV 85.4  78.0 - 100.0 (fL)    MCH 28.7  26.0 - 34.0 (pg)    MCHC 33.6  30.0 - 36.0 (g/dL)    RDW 17.6 (*) 11.5 - 15.5 (%)    Platelets 218  150 - 400 (K/uL)   MAGNESIUM     Status: Normal   Collection Time   08/08/11  5:00 AM      Component Value Range Comment   Magnesium 1.9  1.5 - 2.5 (mg/dL)   GLUCOSE, CAPILLARY  Status: Abnormal   Collection Time    08/08/11  6:24 AM      Component Value Range Comment   Glucose-Capillary 194 (*) 70 - 99 (mg/dL)    Comment 1 Notify RN     GLUCOSE, CAPILLARY     Status: Abnormal   Collection Time   08/08/11  5:05 PM      Component Value Range Comment   Glucose-Capillary 381 (*) 70 - 99 (mg/dL)    Comment 1 Notify RN     GLUCOSE, CAPILLARY     Status: Abnormal   Collection Time   08/08/11 10:22 PM      Component Value Range Comment   Glucose-Capillary 274 (*) 70 - 99 (mg/dL)   BASIC METABOLIC PANEL     Status: Abnormal   Collection Time   08/09/11  5:40 AM      Component Value Range Comment   Sodium 133 (*) 135 - 145 (mEq/L)    Potassium 3.7  3.5 - 5.1 (mEq/L)    Chloride 97  96 - 112 (mEq/L)    CO2 26  19 - 32 (mEq/L)    Glucose, Bld 230 (*) 70 - 99 (mg/dL)    BUN 32 (*) 6 - 23 (mg/dL)    Creatinine, Ser 1.99 (*) 0.50 - 1.10 (mg/dL)    Calcium 8.8  8.4 - 10.5 (mg/dL)    GFR calc non Af Amer 26 (*) >90 (mL/min)    GFR calc Af Amer 30 (*) >90 (mL/min)   CBC     Status: Abnormal   Collection Time   08/09/11  5:40 AM      Component Value Range Comment   WBC 8.1  4.0 - 10.5 (K/uL)    RBC 5.12 (*) 3.87 - 5.11 (MIL/uL)    Hemoglobin 14.8  12.0 - 15.0 (g/dL)    HCT 43.6  36.0 - 46.0 (%)    MCV 85.2  78.0 - 100.0 (fL)    MCH 28.9  26.0 - 34.0 (pg)    MCHC 33.9  30.0 - 36.0 (g/dL)    RDW 17.5 (*) 11.5 - 15.5 (%)    Platelets 225  150 - 400 (K/uL)   URINALYSIS, ROUTINE W REFLEX MICROSCOPIC     Status: Abnormal   Collection Time   08/09/11  9:04 AM      Component Value Range Comment   Color, Urine YELLOW  YELLOW     APPearance TURBID (*) CLEAR     Specific Gravity, Urine 1.015  1.005 - 1.030     pH 8.5 (*) 5.0 - 8.0     Glucose, UA 100 (*) NEGATIVE (mg/dL)    Hgb urine dipstick MODERATE (*) NEGATIVE     Bilirubin Urine NEGATIVE  NEGATIVE     Ketones, ur 15 (*) NEGATIVE (mg/dL)    Protein, ur 100 (*) NEGATIVE (mg/dL)    Urobilinogen, UA 1.0  0.0 - 1.0 (mg/dL)    Nitrite NEGATIVE   NEGATIVE     Leukocytes, UA LARGE (*) NEGATIVE    URINE MICROSCOPIC-ADD ON     Status: Abnormal   Collection Time   08/09/11  9:04 AM      Component Value Range Comment   Squamous Epithelial / LPF RARE  RARE     WBC, UA TOO NUMEROUS TO COUNT  <3 (WBC/hpf)    RBC / HPF 0-2  <3 (RBC/hpf)    Bacteria, UA MANY (*) RARE     Crystals TRIPLE PHOSPHATE CRYSTALS (*) NEGATIVE  CARBOXYHEMOGLOBIN     Status: Abnormal   Collection Time   08/09/11 10:20 AM      Component Value Range Comment   Total hemoglobin 15.8  12.5 - 16.0 (g/dL)    O2 Saturation 87.1      Carboxyhemoglobin 1.7 (*) 0.5 - 1.5 (%)    Methemoglobin 1.2  0.0 - 1.5 (%)   GLUCOSE, CAPILLARY     Status: Abnormal   Collection Time   08/09/11 11:12 AM      Component Value Range Comment   Glucose-Capillary 320 (*) 70 - 99 (mg/dL)    Comment 1 Documented in Chart      Comment 2 Notify RN     GLUCOSE, CAPILLARY     Status: Abnormal   Collection Time   08/09/11  5:34 PM      Component Value Range Comment   Glucose-Capillary 322 (*) 70 - 99 (mg/dL)        Nm Hepato W/eject Fract  08/08/2011  *RADIOLOGY REPORT*  Clinical Data:  Abdominal pain and nausea.  NUCLEAR MEDICINE HEPATOBILIARY IMAGING WITH GALLBLADDER EF  Technique:  Sequential images of the abdomen were obtained out to 60 minutes following intravenous administration of radiopharmaceutical.  After slow intravenous infusion of 1.5 micrograms Cholecystokinin, gallbladder ejection fraction was determined.  Radiopharmaceutical:  5.0 mCi Tc-33m Choletec  Comparison:  abdominal ultrasound 08/01/2011.  Findings: There is symmetric uptake in the liver and prompt excretion into the biliary tree which is visualized by 10 minutes. The gallbladder is visualized at 32minutes.  Activity is noted in the small bowel at 50 minutes.  The patient received a protocoled infusion of CCK and a gallbladder ejection fraction was estimated at 13%.  Normal is greater than 30%.  The patient did not  experience symptoms during CCK infusion.  IMPRESSION:  1.  Normal biliary patency study. 2.  Low gallbladder ejection fraction at 13%.  Original Report Authenticated By: P. Kalman Jewels, M.D.    ROS General: Fatigue, no weight loss. Endocrine:  Polydipsia without polyuria.  No heat intolerance, no cold intolerance.  Eyes:  No blurry vision, no change in vision. Cardiovascular:  recent chest pain prior to admission but no chest pain during our visit. Respiratory: her dyspnea has improved.  Gastrointestinal: no vomiting, no diarrhea, no constipation. Neurologic:No headache, no tremor. Skin: No rash on feet, no foot ulcer, but has resolving ulcers on lower legs. Hematologic/lymphatic:  No bleeding, no lymphadenopathy. Psychiatric:  No confusion except during hypoglycemia, she feels discouraged by her illnesses including the labile glycemic control.    Blood pressure 122/58, pulse 73, temperature 98.3 F (36.8 C), temperature source Oral, resp. rate 20, height 5\' 7"  (1.702 m), weight 74.8 kg (164 lb 14.5 oz), SpO2 100.00%. Physical Exam General: No apparent distress. Eyes: Anicteric, pupils equal and round. Neck: Supple, trachea midline. Thyroid: No enlargement, mobile without fixation, no tenderness. Cardiovascular: Regular rhythm and rate, no audible murmur. Respiratory: Normal respiratory effort, clear to auscultation. Gastrointestinal: Normal pitch active bowel sounds, nontender abdomen without distention or appreciable hepatomegaly. Neurologic: Cranial nerves normal as tested, deep tendon reflexes 1+ symmetric, reduced monofilament sensation in feet. Musculoskeletal: Normal muscle tone, no appreciable muscle atrophy. Skin: Appropriate warmth, no visible rash but dressings in place over lower legs. Mental status: Alert, conversant, speech clear, thought logical, appropriate mood and affect, no hallucinations or delusions evident. Hematologic/lymphatic: No cervical adenopathy, no  jaundice.    Assessment/Plan: 1.  type 1 diabetes mellitus, uncontrolled with neurologic complications 2.  diabetic  peripheral neuropathy 3.  renal insufficiency 4.  coronary artery disease with heart failure 5.  nausea  Twelve units of long-acting insulin once a day has not been sufficient.  25 units of long-acting insulins once a day was excessive at home. To reduce the risk of hypoglycemia yet overcome hyperglycemia, I will transition her long-acting insulin to Levemir 8 units subcutaneous twice a day.  Based upon her history and her response in the hospital, I suspect she needs around 1 unit of rapid acting insulin for every 10 g of carbohydrate and the least one unit of rapid acting insulin for every 40 mg/dL of blood glucose above 100 mg/dL.  Since she is only eating about half of her meals and those meals contain around 60 g of carbohydrate, I have ordered NovoLog insulin with 3 units at each meal plus one additional unit for every 40 mg/dL above 100 mg/dL.  Initially I expect hyperglycemia will persist, but we can slowly adjust her doses.    Her nausea might reflect diabetic gastroparesis since she has long-standing uncontrolled type 1 diabetes mellitus with evidence of diabetic peripheral neuropathy.  Upon discharge from the hospital, I recommend that the patient or nursing staff call [facility], [facility], [place] [address] [place], [place], [place], phone: [phone] to schedule the following: 1.  Office visit with me, Dr. Buddy Duty, within 1 week. 2.  Visit with nurse/certified diabetes educator within one week. 3.  Visit with Alwyn Pea, registered dietitian/certified diabetes educator at next available appointment.  The patient will likely benefit from carbohydrate counting, consistent carbohydrate diet, transition to insulin:carbohydrate ratio, continuous glucose monitor system, and insulin pump.  We can help her with these as an  outpatient.  Claudia Greenley 08/09/2011, 9:06 PM

## 2011-08-10 ENCOUNTER — Encounter (HOSPITAL_COMMUNITY): Payer: Self-pay | Admitting: Internal Medicine

## 2011-08-10 LAB — BASIC METABOLIC PANEL
BUN: 25 mg/dL — ABNORMAL HIGH (ref 6–23)
Calcium: 9.2 mg/dL (ref 8.4–10.5)
GFR calc non Af Amer: 32 mL/min — ABNORMAL LOW (ref >90)
Glucose, Bld: 208 mg/dL — ABNORMAL HIGH (ref 70–99)

## 2011-08-10 LAB — GLUCOSE, CAPILLARY
Glucose-Capillary: 183 mg/dL — ABNORMAL HIGH (ref 70–99)
Glucose-Capillary: 168 mg/dL — ABNORMAL HIGH (ref 70–99)

## 2011-08-10 NOTE — Progress Notes (Signed)
Notified T. Rogue Bussing, NP of pt 6 beats run of V-tach, and pt asympt. Pt states" I feel fine" b/p 112/71, hr 72, 100% sat on room, T.Callahan add mag. Level to this am labs. No further orders, will cont to monitor pt for any changes. No s/s of any acute distress at present.

## 2011-08-10 NOTE — Progress Notes (Signed)
Subjective: Patient reports that her blood sugars are better controlled.  No other specific complaints.   Objective: Vital signs in last 24 hours: Filed Vitals:   08/10/11 0234 08/10/11 0615 08/10/11 0809 08/10/11 1014  BP:  108/70 119/65   Pulse:  64 71 69  Temp:  97.7 F (36.5 C)    TempSrc:      Resp:  20    Height:      Weight:      SpO2: 93% 98%     Weight change: 0.2 kg (7.1 oz)  Intake/Output Summary (Last 24 hours) at 08/10/11 1543 Last data filed at 08/10/11 1300  Gross per 24 hour  Intake   1020 ml  Output    650 ml  Net    370 ml   Physical Exam: General: Awake, Oriented, No acute distress. HEENT: EOMI. Neck: Supple CV: S1 and S2 Lungs: Clear to ascultation bilaterally Abdomen: Soft, Nontender, Nondistended, +bowel sounds. Ext: Good pulses. Trace edema.  Lab Results:  Basename 08/10/11 0330 08/09/11 0540 08/08/11 0500  NA 135 133* --  K 3.4* 3.7 --  CL 99 97 --  CO2 26 26 --  GLUCOSE 208* 230* --  BUN 25* 32* --  CREATININE 1.69* 1.99* --  CALCIUM 9.2 8.8 --  MG 1.7 -- 1.9  PHOS -- -- --   No results found for this basename: AST:2,ALT:2,ALKPHOS:2,BILITOT:2,PROT:2,ALBUMIN:2 in the last 72 hours No results found for this basename: LIPASE:2,AMYLASE:2 in the last 72 hours  Basename 08/09/11 0540 08/08/11 0500  WBC 8.1 8.2  NEUTROABS -- --  HGB 14.8 15.7*  HCT 43.6 46.7*  MCV 85.2 85.4  PLT 225 218   No results found for this basename: CKTOTAL:3,CKMB:3,CKMBINDEX:3,TROPONINI:3 in the last 72 hours No results found for this basename: POCBNP:3 in the last 72 hours No results found for this basename: DDIMER:2 in the last 72 hours No results found for this basename: HGBA1C:2 in the last 72 hours No results found for this basename: CHOL:2,HDL:2,LDLCALC:2,TRIG:2,CHOLHDL:2,LDLDIRECT:2 in the last 72 hours No results found for this basename: TSH,T4TOTAL,FREET3,T3FREE,THYROIDAB in the last 72 hours No results found for this basename:  VITAMINB12:2,FOLATE:2,FERRITIN:2,TIBC:2,IRON:2,RETICCTPCT:2 in the last 72 hours  Micro Results: Recent Results (from the past 240 hour(s))  URINE CULTURE     Status: Normal (Preliminary result)   Collection Time   08/09/11  9:04 AM      Component Value Range Status Comment   Specimen Description URINE, CLEAN CATCH   Final    Special Requests NONE   Final    Setup Time ID:[id number]   Final    Colony Count >=100,000 COLONIES/ML   Final    Culture GRAM NEGATIVE RODS   Final    Report Status PENDING   Incomplete     Studies/Results: Nm Hepato W/eject Fract  08/08/2011  *RADIOLOGY REPORT*  Clinical Data:  Abdominal pain and nausea.  NUCLEAR MEDICINE HEPATOBILIARY IMAGING WITH GALLBLADDER EF  Technique:  Sequential images of the abdomen were obtained out to 60 minutes following intravenous administration of radiopharmaceutical.  After slow intravenous infusion of 1.5 micrograms Cholecystokinin, gallbladder ejection fraction was determined.  Radiopharmaceutical:  5.0 mCi Tc-87m Choletec  Comparison:  abdominal ultrasound 08/01/2011.  Findings: There is symmetric uptake in the liver and prompt excretion into the biliary tree which is visualized by 10 minutes. The gallbladder is visualized at 68minutes.  Activity is noted in the small bowel at 50 minutes.  The patient received a protocoled infusion of CCK and a gallbladder ejection fraction  was estimated at 13%.  Normal is greater than 30%.  The patient did not experience symptoms during CCK infusion.  IMPRESSION:  1.  Normal biliary patency study. 2.  Low gallbladder ejection fraction at 13%.  Original Report Authenticated By: P. Kalman Jewels, M.D.    Medications: I have reviewed the patient's current medications. Scheduled Meds:    . aspirin EC  325 mg Oral Daily  . carvedilol  3.125 mg Oral BID WC  . ciprofloxacin  250 mg Oral BID  . digoxin  0.125 mg Oral Daily  . enalapril  2.5 mg Oral BID WC  . enoxaparin (LOVENOX) injection  30 mg  Subcutaneous Q24H  . escitalopram  20 mg Oral Daily  . insulin aspart  3-10 Units Subcutaneous TID WC  . insulin detemir  8 Units Subcutaneous BID  . levothyroxine  100 mcg Oral Daily  . pantoprazole  40 mg Oral BID AC  . rosuvastatin  40 mg Oral q1800  . DISCONTD: insulin aspart  0-9 Units Subcutaneous TID WC  . DISCONTD: insulin glargine  12 Units Subcutaneous QHS   Continuous Infusions:  PRN Meds:.acetaminophen, ondansetron (ZOFRAN) IV, sodium chloride  Assessment/Plan: 1. CAD, multiple vessel, Systolic CHF, acute on chronic- Appreciate cardiology/CTVS input. Continue current management per cardiology. Work up per [employer].   2.  Gram-negative rod UTI. Started on cipro on 08/09/2011. Change abx based on urine culture.  3.  Acute renal failure, likely due to overdiuresis.  Renal function improved after gentle hydration and after diuretics were held.  Continue to hold diuretics.  4. HTN (hypertension) improved likely due to overdiuresis.  On low dose carvedilol.  5. Hyperlipidemia- on crestor.   6. Venous stasis ulcers stable. Wound care appreciated.   7. Depression - stable.   8. DM insulin dependent - stable.  Improved. Dr. Buddy Duty with endocrinology evaluated the patient and made appropriate recommendations.  9. Nausea- workup negative Suspect patient's nausea is due to acute on chronic systolic heart failure and low EF.  10. Prophylaxis.  Lovenox for DVT prophylaxis.  Pantoprazole for GI prophylaxis.  11.  Disposition pending improvement in renal function.  Patient at this time indicated that she would rather home.  Will arrange for her discharge home with home health tomorrow.    LOS: 16 days  Titus Drone A, MD 08/10/2011, 3:43 PM

## 2011-08-10 NOTE — Progress Notes (Signed)
Subjective: The patient is pleased with her glycemic control today. She feels rather well. Her nausea and anorexia have improved. She ate about two thirds of her biscuits and gravy this morning (29 g of carbohydrates per serving) and consumed about half of her orange juice (13 g of carbohydrates per serving).  Therefore her estimated carbohydrate intake at breakfast was about 26 g.  At lunch, she ate a portion of her ice cream and part of her entre.  Objective: Vital signs in last 24 hours: Temp:  [97.5 F (36.4 C)-97.7 F (36.5 C)] 97.7 F (36.5 C) (12/01 0615) Pulse Rate:  [64-73] 69  (12/01 1014) Resp:  [20-22] 20  (12/01 0615) BP: (102-122)/(58-71) 119/65 mmHg (12/01 0809) SpO2:  [93 %-100 %] 98 % (12/01 0615) Weight:  [75 kg (165 lb 5.5 oz)] 165 lb 5.5 oz (75 kg) (12/01 0229) Weight change: 0.2 kg (7.1 oz) Last BM Date: 08/08/11  Intake/Output from previous day: 11/30 0701 - 12/01 0700 In: 740 [P.O.:260; I.V.:480] Out: 750 [Urine:750] Intake/Output this shift: Total I/O In: 360 [P.O.:360] Out: -   General: Sitting quietly in chair, no apparent distress. HEENT: Unremarkable, extraocular eye movements intact. Skin: Appropriate warmth, dry. Cardiovascular: Regular rate, normal radial pulses. Neurologic: Alert, oriented, no focal deficits, no tremor. Mental status: Alert, conversant, affect appears better today than yesterday. Respiratory:  Normal respiratory effort, normal respiratory rate.   Lab Results:  Basename 08/09/11 0540 08/08/11 0500  WBC 8.1 8.2  HGB 14.8 15.7*  HCT 43.6 46.7*  PLT 225 218   BMET  Basename 08/10/11 0330 08/09/11 0540  NA 135 133*  K 3.4* 3.7  CL 99 97  CO2 26 26  GLUCOSE 208* 230*  BUN 25* 32*  CREATININE 1.69* 1.99*  CALCIUM 9.2 8.8    Medications: I have reviewed the patient's current medications.  Assessment/Plan: 1.  type 1 diabetes mellitus, with neurologic complications, uncontrolled but improving. 2.  diabetic peripheral  neuropathy 3.  renal insufficiency, improving 4.  coronary disease with heart failure 5.  nausea and anorexia, improving  Today her glycemic control is reasonable--with mild hyperglycemia but no hypoglycemia.  I will continue the present insulin regimen for now.  However, she will likely require greater amounts of mealtime insulin as she starts eating more of her food.  With type 1 diabetes mellitus, considerable nausea, or excessive anorexia, I do not recommend metformin. I instructed Ms. Glendening not to resume metformin when she returns home.   LOS: 16 days   Ikey Omary 08/10/2011, 2:26 PM

## 2011-08-10 NOTE — Progress Notes (Signed)
CARDIAC REHAB PHASE I   PRE:  Rate/Rhythm: 69SR  BP:  Supine:   Sitting: 92/50  Standing: 82/50   SaO2:   MODE:  Ambulation: 304   POST:  Rate/Rhythem: 71  BP:  Supine:   Sitting: 112/50  Standing:    SaO2: 98%RA 1506-1522 Pt walked 304 ft on RA with rolling walker and asst x 2. Denied dizziness. BP better after walk. TO chair. Pt feeling better today.  Jeani Sow

## 2011-08-11 DIAGNOSIS — I2589 Other forms of chronic ischemic heart disease: Secondary | ICD-10-CM

## 2011-08-11 LAB — URINE CULTURE
Colony Count: >=100000
Culture  Setup Time: 201211301831

## 2011-08-11 LAB — BASIC METABOLIC PANEL
BUN: 20 mg/dL (ref 6–23)
Chloride: 100 mEq/L (ref 96–112)
Creatinine, Ser: 1.52 mg/dL — ABNORMAL HIGH (ref 0.50–1.10)
GFR calc Af Amer: 42 mL/min — ABNORMAL LOW (ref >90)
GFR calc non Af Amer: 36 mL/min — ABNORMAL LOW (ref >90)
Glucose, Bld: 166 mg/dL — ABNORMAL HIGH (ref 70–99)

## 2011-08-11 LAB — GLUCOSE, CAPILLARY
Glucose-Capillary: 251 mg/dL — ABNORMAL HIGH (ref 70–99)
Glucose-Capillary: 287 mg/dL — ABNORMAL HIGH (ref 70–99)

## 2011-08-11 MED ORDER — INSULIN DETEMIR 100 UNIT/ML ~~LOC~~ SOLN
8.0000 [IU] | Freq: Every day | SUBCUTANEOUS | Status: DC
  Administered 2011-08-11: 8 [IU] via SUBCUTANEOUS
  Filled 2011-08-11: qty 3

## 2011-08-11 MED ORDER — INSULIN DETEMIR 100 UNIT/ML ~~LOC~~ SOLN
9.0000 [IU] | Freq: Every day | SUBCUTANEOUS | Status: DC
  Administered 2011-08-12: 9 [IU] via SUBCUTANEOUS

## 2011-08-11 NOTE — Progress Notes (Signed)
Patient ID: Bridget Martinez, female   DOB: [birth year], 60 y.o.   MRN: [id number] Subjective:  No chest pain or sob.   Objective:  Vital Signs in the last 24 hours: Temp:  [97.8 F (36.6 C)-98.4 F (36.9 C)] 98.2 F (36.8 C) (12/02 0403) Pulse Rate:  [68-80] 73  (12/02 1031) Resp:  [18-20] 18  (12/02 0403) BP: (89-119)/(52-65) 112/52 mmHg (12/02 1031) SpO2:  [96 %-100 %] 96 % (12/02 0403) Weight:  [75.206 kg (165 lb 12.8 oz)] 165 lb 12.8 oz (75.206 kg) (12/02 0403)  Intake/Output from previous day: 12/01 0701 - 12/02 0700 In: 940 [P.O.:720; I.V.:220] Out: 251 [Urine:250; Stool:1] Intake/Output from this shift: Total I/O In: 240 [P.O.:240] Out: -   Physical Exam: Well appearing NAD HEENT: Unremarkable Neck:  No JVD, no thyromegally Lungs:  Clear with no wheezes. HEART:  Regular rate rhythm, no murmurs, no rubs, no clicks Abd:  soft, positive bowel sounds, no organomegally, no rebound, no guarding Ext:  2 plus pulses, no edema, no cyanosis, no clubbing Skin:  No rashes no nodules Neuro:  CN II through XII intact, motor grossly intact  Lab Results:  Basename 08/09/11 0540  WBC 8.1  HGB 14.8  PLT 225    Basename 08/11/11 0600 08/10/11 0330  NA 134* 135  K 3.2* 3.4*  CL 100 99  CO2 25 26  GLUCOSE 166* 208*  BUN 20 25*  CREATININE 1.52* 1.69*   No results found for this basename: TROPONINI:2,CK,MB:2 in the last 72 hours Hepatic Function Panel No results found for this basename: PROT,ALBUMIN,AST,ALT,ALKPHOS,BILITOT,BILIDIR,IBILI in the last 72 hours No results found for this basename: CHOL in the last 72 hours No results found for this basename: PROTIME in the last 72 hours  Imaging: No results found.  Cardiac Studies: telel - Nsr Assessment/Plan:  1. ICM - she denies anginal symptoms. She will continue her current medical therapy. 2. Acute/chronic systolic CHF - She will continue her current medical therapy. She is ambulating well. She will be discharged  tomorrow. She will have uptitration of her medical therapy as an outpatient. 3. Disp. - she refuses rehab. She would like to rehab at home.  LOS: 17 days    Cristopher Peru 08/11/2011, 10:57 AM

## 2011-08-11 NOTE — Progress Notes (Signed)
Subjective: No other specific complaints.  Wants to be discharged home tomorrow.  Objective: Vital signs in last 24 hours: Filed Vitals:   08/10/11 2158 08/11/11 0154 08/11/11 0403 08/11/11 1031  BP: 105/65 101/64 119/65 112/52  Pulse: 70 71 80 73  Temp: 98.4 F (36.9 C)  98.2 F (36.8 C)   TempSrc: Oral     Resp: 18  18   Height:      Weight:   75.206 kg (165 lb 12.8 oz)   SpO2: 97%  96%    Weight change: 0.206 kg (7.3 oz)  Intake/Output Summary (Last 24 hours) at 08/11/11 1424 Last data filed at 08/11/11 0843  Gross per 24 hour  Intake    820 ml  Output    301 ml  Net    519 ml   Physical Exam: General: Awake, Oriented, No acute distress. HEENT: EOMI. Neck: Supple CV: S1 and S2 Lungs: Clear to ascultation bilaterally Abdomen: Soft, Nontender, Nondistended, +bowel sounds. Ext: Good pulses. Trace edema.  Lab Results:  Basename 08/11/11 0600 08/10/11 0330  NA 134* 135  K 3.2* 3.4*  CL 100 99  CO2 25 26  GLUCOSE 166* 208*  BUN 20 25*  CREATININE 1.52* 1.69*  CALCIUM 9.1 9.2  MG -- 1.7  PHOS -- --   No results found for this basename: AST:2,ALT:2,ALKPHOS:2,BILITOT:2,PROT:2,ALBUMIN:2 in the last 72 hours No results found for this basename: LIPASE:2,AMYLASE:2 in the last 72 hours  Basename 08/09/11 0540  WBC 8.1  NEUTROABS --  HGB 14.8  HCT 43.6  MCV 85.2  PLT 225   No results found for this basename: CKTOTAL:3,CKMB:3,CKMBINDEX:3,TROPONINI:3 in the last 72 hours No results found for this basename: POCBNP:3 in the last 72 hours No results found for this basename: DDIMER:2 in the last 72 hours No results found for this basename: HGBA1C:2 in the last 72 hours No results found for this basename: CHOL:2,HDL:2,LDLCALC:2,TRIG:2,CHOLHDL:2,LDLDIRECT:2 in the last 72 hours No results found for this basename: TSH,T4TOTAL,FREET3,T3FREE,THYROIDAB in the last 72 hours No results found for this basename: VITAMINB12:2,FOLATE:2,FERRITIN:2,TIBC:2,IRON:2,RETICCTPCT:2 in the  last 72 hours  Micro Results: Recent Results (from the past 240 hour(s))  URINE CULTURE     Status: Normal   Collection Time   08/09/11  9:04 AM      Component Value Range Status Comment   Specimen Description URINE, CLEAN CATCH   Final    Special Requests NONE   Final    Setup Time ID:[id number]   Final    Colony Count >=100,000 COLONIES/ML   Final    Culture PROTEUS MIRABILIS   Final    Report Status 08/11/2011 FINAL   Final    Organism ID, Bacteria PROTEUS MIRABILIS   Final     Studies/Results: No results found.  Medications: I have reviewed the patient's current medications. Scheduled Meds:    . aspirin EC  325 mg Oral Daily  . carvedilol  3.125 mg Oral BID WC  . ciprofloxacin  250 mg Oral BID  . digoxin  0.125 mg Oral Daily  . enalapril  2.5 mg Oral BID WC  . enoxaparin (LOVENOX) injection  30 mg Subcutaneous Q24H  . escitalopram  20 mg Oral Daily  . insulin aspart  3-10 Units Subcutaneous TID WC  . insulin detemir  8 Units Subcutaneous BID  . levothyroxine  100 mcg Oral Daily  . pantoprazole  40 mg Oral BID AC  . rosuvastatin  40 mg Oral q1800   Continuous Infusions:  PRN Meds:.acetaminophen, ondansetron (ZOFRAN)  IV, sodium chloride  Assessment/Plan: 1. CAD, multiple vessel, Systolic CHF, acute on chronic- Appreciate cardiology/CTVS input. Continue current management per cardiology. Work up per [employer].   2.  Proteus Mirabilis UTI, sensitive to ciprofloxacin. On cipro since 08/09/2011.  Continue for a total of 7 days  3.  Acute renal failure, likely due to overdiuresis.  Continued to improve.  Resume diuretics once renal function normalizes, which likely has to be done as outpatient.  4. HTN (hypertension) stable.  On low dose carvedilol.  5. Hyperlipidemia- on crestor.   6. Venous stasis ulcers stable. Wound care appreciated.   7. Depression - stable.   8. DM insulin dependent - stable.  Improved.  Continue management as per Dr. Buddy Duty with endocrinology.  9.  Nausea- workup negative.  10. Prophylaxis.  Lovenox for DVT prophylaxis.  Pantoprazole for GI prophylaxis.  11.  Disposition will plan for discharge likely tomorrow.  Patient refusing to go to skilled nursing facility and wants to go home with home health PT/OT.    LOS: 17 days  Krystal Delduca A, MD 08/11/2011, 2:24 PM

## 2011-08-11 NOTE — Progress Notes (Signed)
Subjective: The patient was pleased with her glycemic control yesterday, but describes hyperglycemia later today without apparent explanation.  She feels rather well. Her nausea and anorexia have improved, except nausea after eating "a bite of [place] steak" at lunch today. At lunch, she also ate "a bite of rice and cabbage".  Review of systems: No vomiting.  Objective: Vital signs in last 24 hours: Temp:  [98.1 F (36.7 C)-98.4 F (36.9 C)] 98.1 F (36.7 C) (12/02 1430) Pulse Rate:  [70-80] 74  (12/02 1430) Resp:  [18-20] 20  (12/02 1430) BP: (98-119)/(52-77) 110/77 mmHg (12/02 1430) SpO2:  [96 %-98 %] 98 % (12/02 1430) Weight:  [75.206 kg (165 lb 12.8 oz)] 165 lb 12.8 oz (75.206 kg) (12/02 0403) Weight change: 0.206 kg (7.3 oz) Last BM Date: 08/08/11  Intake/Output from previous day: 12/01 0701 - 12/02 0700 In: 940 [P.O.:720; I.V.:220] Out: 251 [Urine:250; Stool:1] Intake/Output this shift: Total I/O In: 240 [P.O.:240] Out: 200 [Urine:200]  General: Sitting quietly in chair, no apparent distress. HEENT: Unremarkable, extraocular eye movements intact. Skin: Appropriate warmth, dry. Cardiovascular: Regular rate, normal radial pulses. Neurologic: Alert, oriented, no focal deficits, no tremor. Mental status: Alert, conversant, affect appears better today than yesterday. Respiratory:  Normal respiratory effort, normal respiratory rate, clear over anterior lung fields. Gastrointestinal: Active bowel sounds, nontender abdomen, nondistended.  Lab Results:  [facility] 08/09/11 0540  WBC 8.1  HGB 14.8  HCT 43.6  PLT 225   BMET  Basename 08/11/11 0600 08/10/11 0330  NA 134* 135  K 3.2* 3.4*  CL 100 99  CO2 25 26  GLUCOSE 166* 208*  BUN 20 25*  CREATININE 1.52* 1.69*  CALCIUM 9.1 9.2    Medications: I have reviewed the patient's current medications.  Assessment/Plan: 1.  type 1 diabetes mellitus, with neurologic complications, uncontrolled but improving. 2.   diabetic peripheral neuropathy 3.  renal insufficiency, improving 4.  coronary disease with heart failure 5.  nausea and anorexia, improving  Today Bridget Martinez had mild hyperglycemia but no hypoglycemia.  We discussed options. The patient agree with the plan of increasing her Levemir to 9 units each morning and 8 units each evening.  She will likely require greater amounts of mealtime insulin as she starts eating more of her food.  With type 1 diabetes mellitus, considerable nausea, or excessive anorexia, I do not recommend metformin. I again advised Bridget Martinez not to resume metformin when she returns home.   LOS: 17 days   Dashon Mcintire 08/11/2011, 5:05 PM

## 2011-08-11 NOTE — Discharge Summary (Signed)
Discharge Summary  YZABELLE WENDEL MR#: [id number]  DOB:Jul 19, 1951  Date of Admission: 07/25/2011 Date of Discharge: 08/12/2011  Patient's PCP: [name], MD  Attending Physician:Aleksandar Duve A  Consults: Jolaine Artist, MD - Cardiology Tharon Aquas Trigt III, MD - [facility] Delrae Rend - Endocrinology  Discharge Diagnoses: Principal Problem:  *CAD, multiple vessel Active Problems:  HTN (hypertension)  Hyperlipidemia  Venous stasis ulcers  Depression  Systolic CHF, acute on chronic  DM type 1 causing renal disease  Acute renal failure  UTI (lower urinary tract infection)  Type 1 diabetes mellitus with neurological manifestations, uncontrolled   Brief Admitting History and Physical 60 y/o female with history of diabetes, HTN, and hyperlipidemia presented on 07/25/2011 with nausea and high blood sugars.  Discharge Medications Current Discharge Medication List    START taking these medications   Details  aspirin EC 325 MG EC tablet Take 1 tablet (325 mg total) by mouth daily. Qty: 30 tablet, Refills: 0    carvedilol (COREG) 3.125 MG tablet Take 1 tablet (3.125 mg total) by mouth 2 (two) times daily with a meal. Qty: 60 tablet, Refills: 0    ciprofloxacin (CIPRO) 250 MG tablet Take 1 tablet (250 mg total) by mouth 2 (two) times daily. Qty: 10 tablet, Refills: 0    digoxin (LANOXIN) 0.125 MG tablet Take 1 tablet (0.125 mg total) by mouth daily. Qty: 30 tablet, Refills: 0    enalapril (VASOTEC) 2.5 MG tablet Take 1 tablet (2.5 mg total) by mouth 2 (two) times daily with a meal. Qty: 30 tablet, Refills: 0    furosemide (LASIX) 20 MG tablet Take 1 tablet (20 mg total) by mouth daily. Qty: 30 tablet, Refills: 0    insulin aspart (NOVOLOG) 100 UNIT/ML injection Inject 3-10 Units into the skin 3 (three) times daily with meals. If blood glucose 70-140 mg/dL, then 3 units.  If blood glucose 141-180 mg/dL, then 4 units. If blood glucose 181-220 mg/dL, then 5 units. If  blood glucose 221-260 mg/dL, then 6 units. If blood glucose 261-300 mg/dL, then 7 units. If blood glucose 301-340 mg/dL, then 8 units. If blood glucose 341-380 mg/dL, then 9 units. If blood glucose is greater than 381 then 10 units. Qty: 1 pen, Refills: 0    ondansetron (ZOFRAN) 4 MG/2ML SOLN Inject 2 mLs (4 mg total) into the vein every 6 (six) hours as needed (Nausea). Qty: 15 mL, Refills: 0    pantoprazole (PROTONIX) 40 MG tablet Take 1 tablet (40 mg total) by mouth 2 (two) times daily before a meal. Qty: 60 tablet, Refills: 0    potassium chloride SA (K-DUR,KLOR-CON) 20 MEQ tablet Take 1 tablet (20 mEq total) by mouth daily. Qty: 30 tablet, Refills: 0    rosuvastatin (CRESTOR) 40 MG tablet Take 1 tablet (40 mg total) by mouth daily at 6 PM. Qty: 30 tablet, Refills: 0      CONTINUE these medications which have CHANGED   Details  !! insulin detemir (LEVEMIR) 100 UNIT/ML injection Inject 8 Units into the skin at bedtime. Qty: 10 mL, Refills: 0    !! insulin detemir (LEVEMIR) 100 UNIT/ML injection Inject 9 Units into the skin every morning. Qty: 10 mL, Refills: 0     !! - Potential duplicate medications found. Please discuss with provider.    CONTINUE these medications which have NOT CHANGED   Details  escitalopram (LEXAPRO) 10 MG tablet Take 10 mg by mouth daily.      levothyroxine (SYNTHROID, LEVOTHROID) 100 MCG tablet Take  100 mcg by mouth daily.        STOP taking these medications     atorvastatin (LIPITOR) 20 MG tablet      insulin lispro (HUMALOG) 100 UNIT/ML injection      metFORMIN (GLUCOPHAGE) 1000 MG tablet      PRESCRIPTION MEDICATION      PRESCRIPTION MEDICATION      ramipril (ALTACE) 10 MG capsule         Hospital Course: CAD, multiple vessel Present on Admission:  .DKA (diabetic ketoacidoses) .HTN (hypertension) .Q waves suggestive of previous myocardial infarction .Venous stasis ulcers .Depression .CAD, multiple vessel .Systolic CHF, acute on  chronic .Hyperlipidemia .DM type 1 causing renal disease .Type 1 diabetes mellitus with neurological manifestations, uncontrolled   AVO VOCI is a 60 y.o. female patient, diabetic,  She is awaiting OT to finalize on the decision. During the hospital stay she has been managed for labile blood sugars(HbA1C). She bottomed out to 18mg /dl and was unconscious, since then, her lantus dose has been gradually increased, being liberal on hyperglycemia, especially in setting of AKI. She has also complained of persistent nausea, whose etiology not entirely clear. A gastric emptying study was normal today.  Patient also completed course of doxycycline for bilateral lower extremity cellulitis.   1. CAD, multiple vessel, Systolic CHF, acute on chronic- Admitted on 07/25/11, with nausea and high sugars, found to be in DKA and to have NSTEMI, eventually having cardiac cath(diffuse 3 vessel disease)/heart MRI which showed viability. Patient being followed by [facility]/Cardiology. Patient felt to be a candidate for CABG but requires some fine tuning prior to proceeding. The heart failure team has been diligently managing her to optimize her cardiac status. Patient has been seen by Dr Naaman Plummer from [facility] where patient has been referred for rehab prior to CABG. Patient declined rehabilitation and wanted to go home with home health.  Which will be arranged at discharge.  2. Proteus Mirabilis UTI, sensitive to ciprofloxacin. On cipro since 08/09/2011. Continue for a total of 7 days   3. Acute renal failure with a ATN, likely due to overdiuresis. Continued to improve.  Initially diuretics were held and as the renal function improved patient was resumed on diuretics prior to discharge.    4. HTN (hypertension) stable. On low dose carvedilol.  Had few episodes of hypotension diuretics and carvedilol dose was titrated.  5. Hyperlipidemia- on crestor.   6. Venous stasis ulcers stable. Wound care appreciated.  Patient will need  outpatient RN to help change the unna boot dressings weekly.  7. Depression - stable.   8. DM insulin dependent - patient and family were very upset with uncontrolled blood sugars.  Endocrinology, Dr. Buddy Duty, was consult said and made appropriate recommendations for diabetic regimen.  After her blood sugars were better controlled patient and family were satisfied.  Patient was instructed that after discharge she may have uncontrolled blood sugars as her diet and regimen may need to be adjusted as outpatient.  Patient was also instructed to check her blood sugars frequently and to watch out for hypoglycemia.  Patient was instructed to have candy or sugar products in case of hypoglycemia.  She was instructed to take candy or sugar products prior to checking her blood sugar.  9. Nausea- workup negative.  Day of Discharge BP 110/77  Pulse 74  Temp(Src) 98.1 F (36.7 C) (Oral)  Resp 20  Ht 5\' 7"  (1.702 m)  Wt 75.206 kg (165 lb 12.8 oz)  BMI 25.97 kg/m2  SpO2 98%  Results for orders placed during the hospital encounter of 07/25/11 (from the past 48 hour(s))  GLUCOSE, CAPILLARY     Status: Abnormal   Collection Time   08/09/11 10:13 PM      Component Value Range Comment   Glucose-Capillary 252 (*) 70 - 99 (mg/dL)   BASIC METABOLIC PANEL     Status: Abnormal   Collection Time   08/10/11  3:30 AM      Component Value Range Comment   Sodium 135  135 - 145 (mEq/L)    Potassium 3.4 (*) 3.5 - 5.1 (mEq/L)    Chloride 99  96 - 112 (mEq/L)    CO2 26  19 - 32 (mEq/L)    Glucose, Bld 208 (*) 70 - 99 (mg/dL)    BUN 25 (*) 6 - 23 (mg/dL)    Creatinine, Ser 1.69 (*) 0.50 - 1.10 (mg/dL)    Calcium 9.2  8.4 - 10.5 (mg/dL)    GFR calc non Af Amer 32 (*) >90 (mL/min)    GFR calc Af Amer 37 (*) >90 (mL/min)   MAGNESIUM     Status: Normal   Collection Time   08/10/11  3:30 AM      Component Value Range Comment   Magnesium 1.7  1.5 - 2.5 (mg/dL)   GLUCOSE, CAPILLARY     Status: Abnormal   Collection  Time   08/10/11  6:48 AM      Component Value Range Comment   Glucose-Capillary 168 (*) 70 - 99 (mg/dL)    Comment 1 Notify RN     GLUCOSE, CAPILLARY     Status: Abnormal   Collection Time   08/10/11 11:06 AM      Component Value Range Comment   Glucose-Capillary 183 (*) 70 - 99 (mg/dL)    Comment 1 Documented in Chart      Comment 2 Notify RN     GLUCOSE, CAPILLARY     Status: Abnormal   Collection Time   08/10/11  4:20 PM      Component Value Range Comment   Glucose-Capillary 168 (*) 70 - 99 (mg/dL)    Comment 1 Documented in Chart      Comment 2 Notify RN     GLUCOSE, CAPILLARY     Status: Abnormal   Collection Time   08/10/11  9:58 PM      Component Value Range Comment   Glucose-Capillary 129 (*) 70 - 99 (mg/dL)    Comment 1 Notify RN     BASIC METABOLIC PANEL     Status: Abnormal   Collection Time   08/11/11  6:00 AM      Component Value Range Comment   Sodium 134 (*) 135 - 145 (mEq/L)    Potassium 3.2 (*) 3.5 - 5.1 (mEq/L)    Chloride 100  96 - 112 (mEq/L)    CO2 25  19 - 32 (mEq/L)    Glucose, Bld 166 (*) 70 - 99 (mg/dL)    BUN 20  6 - 23 (mg/dL)    Creatinine, Ser 1.52 (*) 0.50 - 1.10 (mg/dL)    Calcium 9.1  8.4 - 10.5 (mg/dL)    GFR calc non Af Amer 36 (*) >90 (mL/min)    GFR calc Af Amer 42 (*) >90 (mL/min)   GLUCOSE, CAPILLARY     Status: Abnormal   Collection Time   08/11/11  6:10 AM      Component Value Range Comment   Glucose-Capillary 177 (*)  70 - 99 (mg/dL)    Comment 1 Notify RN     GLUCOSE, CAPILLARY     Status: Abnormal   Collection Time   08/11/11 11:57 AM      Component Value Range Comment   Glucose-Capillary 251 (*) 70 - 99 (mg/dL)    Comment 1 Documented in Chart      Comment 2 Notify RN     GLUCOSE, CAPILLARY     Status: Abnormal   Collection Time   08/11/11  4:00 PM      Component Value Range Comment   Glucose-Capillary 287 (*) 70 - 99 (mg/dL)    Comment 1 Documented in Chart      Comment 2 Notify RN       Dg Chest 1 View  07/25/2011   *RADIOLOGY REPORT*  Clinical Data: Nausea; hyperglycemia.  Leukocytosis.  CHEST - 1 VIEW  Comparison: None.  Findings: The lungs are well-aerated.  Mild left mid lung linear atelectasis noted.  There is no evidence of pleural effusion or pneumothorax.  The cardiomediastinal silhouette is enlarged.  No acute osseous abnormalities are seen.  IMPRESSION: Mild left mid lung linear atelectasis; cardiomegaly noted.  Original Report Authenticated By: Santa Lighter, M.D.   Nm Gastric Emptying  08/06/2011  *RADIOLOGY REPORT*  Clinical Data:  Nausea and reflux.  NUCLEAR MEDICINE GASTRIC EMPTYING SCAN  Technique:  After oral ingestion of radiolabeled meal, sequential abdominal images were obtained for 120 minutes.  Residual percentage of activity remaining within the stomach was calculated at 60 and 120 minutes.  Radiopharmaceutical: 2.0 mCi Tc-74m sulfur colloid.  Comparison:  None  Findings: Corrected counts for assessment of residual radiolabeled meal in the stomach indicate the following percent remaining:  30 minutes:  85% remaining 60 minutes:  93% remaining (this higher count is likely due to distribution of meal in the stomach along with a single plane imaging rather than dual plane geometric mean calculation). 90 minutes:  72% remaining 120 minutes:  27% remaining  Normally, under 30% is remaining at 120 minutes.  IMPRESSION:  1.  Gastric emptying measures in the normal range at 120 minutes although it seems mildly slow to empty prior to the 120-minute mark. Accordingly the study is considered technically normal but in the upper normal range for emptying time.  Original Report Authenticated By: Carron Curie, M.D.   US Abdomen Complete  08/01/2011  *RADIOLOGY REPORT*  Clinical Data:  Elevated liver function tests. Nausea and vomiting.  COMPLETE ABDOMINAL ULTRASOUND  Comparison:  Abdominal ultrasound 07/25/2011.  Findings:  Gallbladder:  No gallstones, gallbladder wall thickening, or pericholecystic  fluid.  Common bile duct:  Measures 0.3 cm.  Liver:  No focal lesion identified.  Within normal limits in parenchymal echogenicity.  IVC:  Appears normal.  Pancreas:  No focal abnormality seen.  Spleen:  Measures 5.1 cm and appears normal.  Right Kidney:  Measures 11.1 cm.  1.2 cm simple cyst in the upper pole noted.  No stones or hydronephrosis.  Left Kidney:  Measures 10.7 cm and appears normal.  Abdominal aorta:  No aneurysm identified.  IMPRESSION: No acute finding.  Normal appearing liver and pancreas.  Small right renal cyst again seen.  Original Report Authenticated By: Arvid Right. Luther Parody, M.D.   US Abdomen Complete  07/25/2011  *RADIOLOGY REPORT*  Clinical Data:  Right upper quadrant pain, nausea/vomiting, leukocytosis  COMPLETE ABDOMINAL ULTRASOUND  Comparison:  None.  Findings:  Gallbladder:  No gallstones, gallbladder wall thickening, or pericholecystic fluid.  Negative  sonographic Murphy's sign.  Common bile duct:  Measures 5 mm.  Liver:  No focal lesion identified.  Within normal limits in parenchymal echogenicity.  IVC:  Appears normal.  Pancreas:  Visualized portions who was limits.  Spleen:  Measures 6.9 cm.  Right Kidney:  Measures 12.7 cm.  13 x 9 x 9 mm interpolar cyst. No hydronephrosis.  Left Kidney:  Measures 11.2 cm.  No mass or hydronephrosis.  Abdominal aorta:  No aneurysm identified.  Additional comments:  Tiny right pleural effusion.  IMPRESSION: Normal sonographic appearance of the gallbladder.  1.3 cm right renal cyst.  Tiny right pleural effusion.  Original Report Authenticated By: Julian Hy, M.D.   Nm Hepato W/eject Fract  08/08/2011  *RADIOLOGY REPORT*  Clinical Data:  Abdominal pain and nausea.  NUCLEAR MEDICINE HEPATOBILIARY IMAGING WITH GALLBLADDER EF  Technique:  Sequential images of the abdomen were obtained out to 60 minutes following intravenous administration of radiopharmaceutical.  After slow intravenous infusion of 1.5 micrograms Cholecystokinin, gallbladder  ejection fraction was determined.  Radiopharmaceutical:  5.0 mCi Tc-79m Choletec  Comparison:  abdominal ultrasound 08/01/2011.  Findings: There is symmetric uptake in the liver and prompt excretion into the biliary tree which is visualized by 10 minutes. The gallbladder is visualized at 25minutes.  Activity is noted in the small bowel at 50 minutes.  The patient received a protocoled infusion of CCK and a gallbladder ejection fraction was estimated at 13%.  Normal is greater than 30%.  The patient did not experience symptoms during CCK infusion.  IMPRESSION:  1.  Normal biliary patency study. 2.  Low gallbladder ejection fraction at 13%.  Original Report Authenticated By: P. Kalman Jewels, M.D.   Dg Chest Port 1 View  07/26/2011  *RADIOLOGY REPORT*  Clinical Data: Shortness of breath, congestive heart failure  PORTABLE CHEST - 1 VIEW  Comparison: Portable chest x-ray of 07/25/2011  Findings: Minimal linear atelectasis or scarring in the left mid lung is stable.  No active infiltrate or effusion is seen.  There is no evidence of congestive heart failure.  Cardiomegaly is stable.  No bony abnormality is seen.  IMPRESSION: Stable cardiomegaly and linear atelectasis or scarring in the left mid lung base.  No CHF.  Original Report Authenticated By: Joretta Bachelor, M.D.   Mr Card Morphology Wo/w Cm  07/31/2011  Cardiac MRI:  Indication: Viability.  Severe 3VD with Cardiogenic Shock  Protocol:  The patient was scanned on a 1.5 Tesla GE magnet. Functional imaging was done using Fiesta sequences.  2,3 and 4 chamber views were done to assess RWMA;s.  Quantitative EF was calculated using mass analysis software on a [place]. The patient received 20cc of multihance.  After 10 minutes inversion recovery sequences were done to assess for viability  Findings:  There were bilateral pleural effusions.  There was mild LAE.  RA/RV were normal.  There was severe LVE with diffuse hypokinesis and mid and basilar  inferior wall akinesis and thinning.  The quantitative EF was 20% ( EDV 245cc, ESV 198cc, and SV of only 47cc ).  The mid and basilar inferior wall had full thickness scar. Portions of the inferior septum had full thickness scar. The inferolateral wall had subendocardial scar. The anterior wall and apex where without scar.  Impression     1)    Severe LVE with diffuse hypokinesis and mid/basilar inferior       wall akinesis EF 20%        2)  Likely nonviable mid and basilar inferior wall and inferoseptum.  Likely viable inferolateral wall. Likely hibernating anterior and anterior lateral wall (hypokinesis with no scar) 3)    Mild LAE 4)    Normal RA/RV 5)    Bilateral pleural effusions Original Report Authenticated By: [id number]   2D ECHO on 07/26/2011 Study Conclusions - Left ventricle: The cavity size was severely dilated. Wall thickness was normal. Systolic function was severely reduced. The estimated ejection fraction was in the range of 25% to 30%. - Right atrium: The atrium was mildly dilated. - Tricuspid valve: Moderate-severe regurgitation.    Disposition: Home with home health PT.  Diet: Heart healthy diet.  Activity: Resume as tolerated as per PT.   Follow-up Appts: Discharge Orders    Future Appointments: Provider: Department: Dept Phone: Center:   08/16/2011 11:15 AM [facility] [phone] None     Discharge Orders    Future Appointments: Provider: Department: Dept Phone: Center:   08/22/2011 12:45 PM [facility] [phone] None     Future Orders Please Complete By Expires   Diet - low sodium heart healthy      Increase activity slowly      Discharge instructions      Comments:   Followup with HASANAJ,XAJE A, MD (PCP) in 1week. Followup with Dr. Buddy Duty (Endocrinology) in 1 week. Follow-up Information    Follow up with Glori Bickers, MD on 08/22/2011. ([facility]  [place] [id number])   at  12:45             Time spent on discharge, talking to the patient, and coordinating care: 40 mins.   Signed: Bynum Bellows, MD 08/12/2011, 12:27 PM

## 2011-08-12 DIAGNOSIS — I5021 Acute systolic (congestive) heart failure: Secondary | ICD-10-CM

## 2011-08-12 LAB — GLUCOSE, CAPILLARY: Glucose-Capillary: 188 mg/dL — ABNORMAL HIGH (ref 70–99)

## 2011-08-12 LAB — BASIC METABOLIC PANEL
BUN: 14 mg/dL (ref 6–23)
Calcium: 9.3 mg/dL (ref 8.4–10.5)
GFR calc Af Amer: 48 mL/min — ABNORMAL LOW (ref >90)
GFR calc non Af Amer: 41 mL/min — ABNORMAL LOW (ref >90)
Glucose, Bld: 169 mg/dL — ABNORMAL HIGH (ref 70–99)
Potassium: 4 mEq/L (ref 3.5–5.1)

## 2011-08-12 MED ORDER — INSULIN DETEMIR 100 UNIT/ML ~~LOC~~ SOLN: 9.0000 [IU] | SUBCUTANEOUS | Status: DC

## 2011-08-12 MED ORDER — INSULIN DETEMIR 100 UNIT/ML ~~LOC~~ SOLN: 8.0000 [IU] | Freq: Every day | SUBCUTANEOUS | Status: DC

## 2011-08-12 MED ORDER — POTASSIUM CHLORIDE CRYS ER 20 MEQ PO TBCR: 20.0000 meq | EXTENDED_RELEASE_TABLET | Freq: Every day | ORAL | Status: DC

## 2011-08-12 MED ORDER — CIPROFLOXACIN HCL 250 MG PO TABS: 250.0000 mg | ORAL_TABLET | Freq: Two times a day (BID) | ORAL | Status: DC

## 2011-08-12 MED ORDER — FUROSEMIDE 20 MG PO TABS: 20.0000 mg | ORAL_TABLET | Freq: Every day | ORAL | Status: DC

## 2011-08-12 MED ORDER — ROSUVASTATIN CALCIUM 40 MG PO TABS: 40.0000 mg | ORAL_TABLET | Freq: Every day | ORAL | Status: DC

## 2011-08-12 MED ORDER — DIGOXIN 125 MCG PO TABS: 0.1250 mg | ORAL_TABLET | Freq: Every day | ORAL | Status: DC

## 2011-08-12 MED ORDER — ENALAPRIL MALEATE 2.5 MG PO TABS: 2.5000 mg | ORAL_TABLET | Freq: Two times a day (BID) | ORAL | Status: DC

## 2011-08-12 MED ORDER — INSULIN ASPART 100 UNIT/ML ~~LOC~~ SOLN: 3.0000 [IU] | Freq: Three times a day (TID) | SUBCUTANEOUS | Status: DC

## 2011-08-12 MED ORDER — ONDANSETRON HCL 4 MG/2ML IJ SOLN: 4.0000 mg | Freq: Four times a day (QID) | INTRAMUSCULAR | Status: DC | PRN

## 2011-08-12 MED ORDER — CARVEDILOL 3.125 MG PO TABS: 3.1250 mg | ORAL_TABLET | Freq: Two times a day (BID) | ORAL | Status: DC

## 2011-08-12 MED ORDER — PANTOPRAZOLE SODIUM 40 MG PO TBEC: 40.0000 mg | DELAYED_RELEASE_TABLET | Freq: Two times a day (BID) | ORAL | Status: DC

## 2011-08-12 MED ORDER — ASPIRIN 325 MG PO TBEC: 325.0000 mg | DELAYED_RELEASE_TABLET | Freq: Every day | ORAL | Status: AC

## 2011-08-12 NOTE — Progress Notes (Signed)
Occupational Therapy Evaluation Patient Details Name: JERNIYA SMUTNY MRN: [id number] DOB: 09-18-1950 Today's Date: 08/12/2011  OT Assessment/Plan OT Assessment/Plan Equipment Recommended: Tub/shower seat OT Goals ADL Goals Additional ADL Goal #1: pt able to verbalize energy conservation techniques with verbal cueing  OT Treatment   ADL ADL Toilet Transfer Method: Ambulating Toileting - Clothing Manipulation: Simulated;Supervision/safety Where Assessed - Camera operator Manipulation: Standing Toileting - Hygiene: Simulated;Supervision/safety Where Assessed - Toileting Hygiene: Standing Tub/Shower Transfer: Not assessed ADL Comments: Pt going home today hopefully.  Pt did walk in room and while walking OT provided education regarding energy conservation with ADL activity as pt fatigues quickly.  Pt verbalized understanding.     End of Session OT - End of Session Activity Tolerance: Patient limited by fatigue Patient left: in chair;with call bell in reach;with family/visitor present General Behavior During Session: [facility] for tasks performed Cognition: [facility] for tasks performed  Domenick Gong  08/12/2011, 2:25 PM

## 2011-08-12 NOTE — Plan of Care (Signed)
Problem: Phase I Progression Outcomes Goal: Other Phase I Outcomes/Goals Outcome: Progressing Pt received CHF booklet. Pt verbalized different zones, medications and aware of salt and fluid restrictions. Will continue to educate pt. Ignatius Klo[employer] laboratory technician.

## 2011-08-12 NOTE — Progress Notes (Signed)
Utilization Review Completed.Donne Anon T12/11/2010

## 2011-08-12 NOTE — Progress Notes (Signed)
Spoke with patient, she chose [facility] for HHRN/ PT,  Patient for discharge today, referral made to [facility].  Soc will begin 24-48 hrs post discharge.

## 2011-08-12 NOTE — Progress Notes (Signed)
Physical Therapy Treatment Patient Details Name: Bridget Martinez MRN: [id number] DOB: December 30, 1950 Today's Date: 08/12/2011  PT Assessment/Plan  PT - Assessment/Plan Comments on Treatment Session: Pt tolerated treatment well.  Pt is excited about going home, but reports she is slightly nervous about the stairs to get into her house, due to having no rails.  Pt reports husband will be there to provide A, as well as possibly her daughter or son.  Pt and husband educated on proper stair technique.  Pt ambulated 4 stairs with min-mod A.   PT Plan: Discharge plan remains appropriate;Frequency remains appropriate PT Frequency: Min 3X/week Follow Up Recommendations: Home health PT Equipment Recommended: Tub/shower seat PT Goals  Acute Rehab PT Goals PT Goal Formulation: With patient PT Goal: Supine/Side to Sit - Progress: Other (comment) (not assessed) PT Goal: Sit to Supine/Side - Progress: Other (comment) (not assessed) PT Transfer Goal: Bed to Chair/Chair to Bed - Progress: Other (comment) (not assessed) PT Goal: Ambulate - Progress: Progressing toward goal PT Goal: Up/Down Stairs - Progress: Progressing toward goal  PT Treatment Precautions/Restrictions  Precautions Precautions: Fall Required Braces or Orthoses: No Restrictions Weight Bearing Restrictions: No Mobility (including Balance) Bed Mobility Bed Mobility: No Transfers Transfers: Yes Sit to Stand: 6: Modified independent (Device/Increase time);With upper extremity assist;From chair/3-in-1;With armrests Sit to Stand Details (indicate cue type and reason): Pt required increased time to stand and use of UE Stand to Sit: 6: Modified independent (Device/Increase time);With upper extremity assist;With armrests;To chair/3-in-1 Stand to Sit Details: Pt required increased time and UE assist Ambulation/Gait Ambulation/Gait: Yes Ambulation/Gait Assistance: 5: Supervision Ambulation/Gait Assistance Details (indicate cue type and  reason): Supervision for pt safety. Ambulation Distance (Feet): 250 Feet Assistive device: Rolling walker Gait Pattern: Step-through pattern;Shuffle Gait velocity: Decreased gait velocity Stairs: Yes Stairs Assistance: 3: Mod assist Stairs Assistance Details (indicate cue type and reason): Min A for balance and pt safety ascending stairs with the use of rail.  Verbal cues for proper stair ambulation.  Mod A descending stairs without the use of rail. Stair Management Technique: One rail Left;No rails;Step to pattern;Forwards Number of Stairs: 4  Wheelchair Mobility Wheelchair Mobility: No  Posture/Postural Control Posture/Postural Control: No significant limitations  Vitals: Pt's BP initially measure low with automatic cuff:  Sitting 80/65  Standing 75/45 Pt's BP measured higher with manual cuff:  Sitting 106/50 (MD measured)   End of Session PT - End of Session Equipment Utilized During Treatment: Gait belt;Other (comment) (RW) Activity Tolerance: Patient tolerated treatment well Patient left: in chair;with call bell in reach;with family/visitor present Nurse Communication: Mobility status for transfers;Mobility status for ambulation General Behavior During Session: [facility] for tasks performed Cognition: [facility] for tasks performed  Ruel Favors 08/12/2011, 10:01 AM  Antoine Poche, PT DPT [phone]

## 2011-08-12 NOTE — Progress Notes (Signed)
Cardiac Rehab [id number] Pt states ready for discharge. Discussed ex ed using perceived exertion and endpoints of ex for walking. Stressed watching salt  And adhering to diabetic diet. Pt voiced understanding. Discussed CRP 2 for after surgery. Krystel Fletchall DunlapRN

## 2011-08-12 NOTE — Progress Notes (Signed)
Clinical Social Work, 08/12/11, 1454:  Pt is refusing SNF and plan is for patient to go home with home health.  Please inform CSW of any additional needs.[place], [place], [place]

## 2011-08-12 NOTE — Progress Notes (Signed)
Subjective: No specific complaints. Denies any pain.  Objective: Vital signs in last 24 hours: Filed Vitals:   08/12/11 0401 08/12/11 0900 08/12/11 0905 08/12/11 0910  BP: 120/71 80/65 75/45  106/50  Pulse: 75     Temp: 97.9 F (36.6 C)     TempSrc: Oral     Resp: 18     Height:      Weight:      SpO2: 98%      Weight change:   Intake/Output Summary (Last 24 hours) at 08/12/11 1155 Last data filed at 08/12/11 1100  Gross per 24 hour  Intake    500 ml  Output    750 ml  Net   -250 ml   Physical Exam: General: Awake, Oriented, No acute distress. HEENT: EOMI. Neck: Supple CV: S1 and S2 Lungs: Clear to ascultation bilaterally Abdomen: Soft, Nontender, Nondistended, +bowel sounds. Ext: Good pulses. ACE wraps.  Lab Results:  Basename 08/12/11 0500 08/11/11 0600 08/10/11 0330  NA 137 134* --  K 4.0 3.2* --  CL 101 100 --  CO2 23 25 --  GLUCOSE 169* 166* --  BUN 14 20 --  CREATININE 1.36* 1.52* --  CALCIUM 9.3 9.1 --  MG -- -- 1.7  PHOS -- -- --   No results found for this basename: AST:2,ALT:2,ALKPHOS:2,BILITOT:2,PROT:2,ALBUMIN:2 in the last 72 hours No results found for this basename: LIPASE:2,AMYLASE:2 in the last 72 hours No results found for this basename: WBC:2,NEUTROABS:2,HGB:2,HCT:2,MCV:2,PLT:2 in the last 72 hours No results found for this basename: CKTOTAL:3,CKMB:3,CKMBINDEX:3,TROPONINI:3 in the last 72 hours No results found for this basename: POCBNP:3 in the last 72 hours No results found for this basename: DDIMER:2 in the last 72 hours No results found for this basename: HGBA1C:2 in the last 72 hours No results found for this basename: CHOL:2,HDL:2,LDLCALC:2,TRIG:2,CHOLHDL:2,LDLDIRECT:2 in the last 72 hours No results found for this basename: TSH,T4TOTAL,FREET3,T3FREE,THYROIDAB in the last 72 hours No results found for this basename: VITAMINB12:2,FOLATE:2,FERRITIN:2,TIBC:2,IRON:2,RETICCTPCT:2 in the last 72 hours  Micro Results: Recent Results (from  the past 240 hour(s))  URINE CULTURE     Status: Normal   Collection Time   08/09/11  9:04 AM      Component Value Range Status Comment   Specimen Description URINE, CLEAN CATCH   Final    Special Requests NONE   Final    Setup Time [id number]   Final    Colony Count >=100,000 COLONIES/ML   Final    Culture PROTEUS MIRABILIS   Final    Report Status 08/11/2011 FINAL   Final    Organism ID, Bacteria PROTEUS MIRABILIS   Final     Studies/Results: No results found.  Medications: I have reviewed the patient's current medications. Scheduled Meds:    . aspirin EC  325 mg Oral Daily  . carvedilol  3.125 mg Oral BID WC  . ciprofloxacin  250 mg Oral BID  . digoxin  0.125 mg Oral Daily  . enalapril  2.5 mg Oral BID WC  . enoxaparin (LOVENOX) injection  30 mg Subcutaneous Q24H  . escitalopram  20 mg Oral Daily  . furosemide  20 mg Oral Daily  . insulin aspart  3-10 Units Subcutaneous TID WC  . insulin detemir  8 Units Subcutaneous QHS  . insulin detemir  9 Units Subcutaneous Daily  . levothyroxine  100 mcg Oral Daily  . pantoprazole  40 mg Oral BID AC  . potassium chloride  20 mEq Oral Daily  . rosuvastatin  40 mg Oral q1800  .  DISCONTD: insulin detemir  8 Units Subcutaneous BID   Continuous Infusions:  PRN Meds:.acetaminophen, ondansetron (ZOFRAN) IV, sodium chloride  Assessment/Plan: 1. CAD, multiple vessel, Systolic CHF, acute on chronic- Appreciate cardiology/CTVS input. Continue current management per cardiology.  2.  Proteus Mirabilis UTI, sensitive to ciprofloxacin. On cipro since 08/09/2011.  Continue for a total of 7 days  3.  Acute renal failure, likely due to overdiuresis.  Continued to improve.  Diuretics discontinued.  4. HTN (hypertension) stable.  On low dose carvedilol.  5. Hyperlipidemia- on crestor.   6. Venous stasis ulcers stable. Wound care appreciated.   7. Depression - stable.   8. DM insulin dependent - stable.  Improved.  Continue management as  per Dr. Buddy Duty with endocrinology.  9. Nausea- workup negative.  10. Prophylaxis.  Lovenox for DVT prophylaxis.  Pantoprazole for GI prophylaxis.  11.  Disposition will plan for discharge likely tomorrow.  Home with home health PT/OT.    LOS: 18 days  Cavin Longman A, MD 08/12/2011, 11:55 AM

## 2011-08-12 NOTE — Progress Notes (Signed)
Subjective:   60 y/o woman with DM2. Admitted 2 weeks ago with NSTEMI. Found to have EF 25% with severe CAD and cardiogenic shock. Treated with milrinone and aggressive diuresis. Now down 49 pounds. However. Continued to diurese well after diuretics stopped and developed ATN. Now improving. Plan is to d/c with home health/PT and return for CABG (Dr. PVT) when stronger and superficial leg ulcers improved  Mr and Mrs Puleo pleased with glycemic regimen.  Denies nausea/SOB/orthopnea/PND. Weight decreased 49 pounds since admit.   Intake/Output Summary (Last 24 hours) at 08/12/11 0852 Last data filed at 08/12/11 0700  Gross per 24 hour  Intake    240 ml  Output    550 ml  Net   -310 ml    Current meds:    . aspirin EC  325 mg Oral Daily  . carvedilol  3.125 mg Oral BID WC  . ciprofloxacin  250 mg Oral BID  . digoxin  0.125 mg Oral Daily  . enalapril  2.5 mg Oral BID WC  . enoxaparin (LOVENOX) injection  30 mg Subcutaneous Q24H  . escitalopram  20 mg Oral Daily  . insulin aspart  3-10 Units Subcutaneous TID WC  . insulin detemir  8 Units Subcutaneous QHS  . insulin detemir  9 Units Subcutaneous Daily  . levothyroxine  100 mcg Oral Daily  . pantoprazole  40 mg Oral BID AC  . rosuvastatin  40 mg Oral q1800  . DISCONTD: insulin detemir  8 Units Subcutaneous BID   Infusions:     Objective:  Blood pressure 120/71, pulse 75, temperature 97.9 F (36.6 C), temperature source Oral, resp. rate 18, height 5\' 7"  (1.702 m), weight 75.206 kg (165 lb 12.8 oz), SpO2 98.00%. Weight change:   Physical Exam: General:  Well appearing. No resp difficulty HEENT: normal Neck: supple. JVP 5-6 . Carotids 2+ bilat; no bruits. No lymphadenopathy or thryomegaly appreciated. Cor: PMI nondisplaced. Regular rate & rhythm. No rubs, gallops or murmurs. Lungs: clear Abdomen: soft, nontender, nondistended. No hepatosplenomegaly. No bruits or masses. Good bowel sounds. Extremities: no cyanosis, clubbing,  rash, edema R and LLE unna boots Neuro: alert & orientedx3, cranial nerves grossly intact. moves all 4 extremities w/o difficulty. Affect pleasant  Telemetry: SR  Lab Results: Basic Metabolic Panel:  Lab [id number] 0500 08/11/11 0600 08/10/11 0330 08/09/11 0540 08/08/11 0500 08/06/11 0500  NA 137 134* 135 133* 133* --  K 4.0 3.2* -- -- -- --  CL 101 100 99 97 93* --  CO2 23 25 26 26 27  --  GLUCOSE 169* 166* 208* 230* 197* --  BUN 14 20 25* 32* 32* --  CREATININE 1.36* 1.52* 1.69* 1.99* 2.15* --  CALCIUM 9.3 9.1 9.2 8.8 9.2 --  MG -- -- 1.7 -- 1.9 1.9  PHOS -- -- -- -- -- --   Liver Function Tests:  Lab 08/07/11 0654 08/06/11 1810  AST 38* 46*  ALT 29 36*  ALKPHOS 276* 318*  BILITOT 0.9 0.8  PROT 6.4 7.0  ALBUMIN 2.8* 2.9*    Lab 08/05/11 1130  LIPASE 37  AMYLASE 15   No results found for this basename: AMMONIA:5 in the last 168 hours CBC:  Lab 08/09/11 0540 08/08/11 0500 08/07/11 0654  WBC 8.1 8.2 10.0  NEUTROABS -- -- --  HGB 14.8 15.7* 16.6*  HCT 43.6 46.7* 48.2*  MCV 85.2 85.4 85.5  PLT 225 218 193   Cardiac Enzymes:  Lab 08/05/11 1400  CKTOTAL 26  CKMB 2.9  CKMBINDEX --  TROPONINI <0.30   BNP: No results found for this basename: POCBNP:5 in the last 168 hours CBG:  Lab 08/12/11 0609 08/11/11 2127 08/11/11 1600 08/11/11 1157 08/11/11 0610  GLUCAP 188* 166* 287* 251* 177*   Microbiology: Lab Results  Component Value Date   CULT PROTEUS MIRABILIS 08/09/2011    Lab 08/09/11 0904  CULT PROTEUS MIRABILIS  SDES URINE, CLEAN CATCH    Imaging: No results found.   ASSESSMENT:  1) Cardiogenic shock, resolved  -- off Milrinone  2) Acute systolic HF secondary to ischemic CM, EF 25-30%  3) 3v-CAD, severe  --LAD 80% mid LCX 80% prox, OM1 95% RCA T prox with R->R & L-> R collats  4) Morbid obesity  5) DM2  6) Leg ulcers  7) Hyperlipidemia  8) Moderate to severe MR/TR  9) Acute renal failure  10) UTI   PLAN/DISCUSSION: NYHA III. Volume  status stable. Continue current medications. She will follow in heart failure clinic 08-16-11 at 11:15. Will need HHRN for weekly unna boot dressing changes and HHPT.    LOS: 18 days   CLEGG,AMY, NP 08/12/2011, 8:52 AM  Patient seen and examined with Darrick Grinder, NP. We discussed all aspects of the encounter. I agree with the assessment and plan as stated above. Stable for d/c from cardiac perspective. Continue current meds. Add lasix 20 daily with kcl 20 daily. We will see in HF clinic next week with eye toward getting her ready for CABG.   Told to call with edema, CP, SOB or dizziness.    Carrine Kroboth BensimhonMD 9:21 AM

## 2011-08-12 NOTE — Progress Notes (Signed)
PICC d/c'ed by IV team. Tele d/c'ed. D/c instructions and medications discussed with pt and pt's spouse. Reviewed heart failure education. All questions answered. Pt states understanding. Prescriptions faxed to pt's pharmacy per request. Pt d/c'ed to home.

## 2011-08-14 ENCOUNTER — Telehealth (HOSPITAL_COMMUNITY): Payer: Self-pay | Admitting: *Deleted

## 2011-08-14 NOTE — Telephone Encounter (Signed)
Spoke w/pt she needs help with her Zofran and Insulin advised she needs to f/u with her pcp she is agreeable and states she is seeing him this afternoon

## 2011-08-14 NOTE — Telephone Encounter (Signed)
Husband called this am, they are having some trouble with refills on her medications.  He needs you to call [facility] in [place], [phone].   Thanks!

## 2011-08-16 ENCOUNTER — Ambulatory Visit (HOSPITAL_COMMUNITY): Payer: BC Managed Care – PPO

## 2011-08-22 ENCOUNTER — Telehealth (HOSPITAL_COMMUNITY): Payer: Self-pay | Admitting: *Deleted

## 2011-08-22 ENCOUNTER — Ambulatory Visit (HOSPITAL_COMMUNITY)
Admit: 2011-08-22 | Discharge: 2011-08-22 | Disposition: A | Discharge status: Home / Self Care | Payer: BC Managed Care – PPO | Source: Ambulatory Visit | Attending: Internal Medicine | Admitting: Internal Medicine

## 2011-08-22 VITALS — BP 106/50 | HR 85 | Wt 165.0 lb

## 2011-08-22 DIAGNOSIS — I2510 Atherosclerotic heart disease of native coronary artery without angina pectoris: Secondary | ICD-10-CM

## 2011-08-22 DIAGNOSIS — I5022 Chronic systolic (congestive) heart failure: Secondary | ICD-10-CM

## 2011-08-22 DIAGNOSIS — I5023 Acute on chronic systolic (congestive) heart failure: Secondary | ICD-10-CM

## 2011-08-22 DIAGNOSIS — I5032 Chronic diastolic (congestive) heart failure: Secondary | ICD-10-CM | POA: Insufficient documentation

## 2011-08-22 MED ORDER — FUROSEMIDE 20 MG PO TABS: 20.0000 mg | ORAL_TABLET | ORAL | Status: DC | PRN

## 2011-08-22 MED ORDER — CARVEDILOL 3.125 MG PO TABS: 3.1250 mg | ORAL_TABLET | Freq: Two times a day (BID) | ORAL | Status: DC

## 2011-08-22 MED ORDER — ONDANSETRON HCL 4 MG PO TABS: 4.0000 mg | ORAL_TABLET | Freq: Three times a day (TID) | ORAL | Status: DC | PRN

## 2011-08-22 MED ORDER — ONDANSETRON HCL 4 MG PO TABS: 4.0000 mg | ORAL_TABLET | Freq: Three times a day (TID) | ORAL | Status: AC | PRN

## 2011-08-22 NOTE — Assessment & Plan Note (Addendum)
Doing well. No evidence of ongoing  ischemia. Follow up Dr Darcey Nora for possible CABG.

## 2011-08-22 NOTE — Patient Instructions (Addendum)
Follow  up in 3  weeks  Repeat  in 2 weeksECHO  Take lasix 20 mg daily only if your weight increases to 168 pounds  Take carvedilol 3.125 mg twice a day  Do the following things EVERYDAY: 1) Weigh yourself in the morning before breakfast. Write it down and keep it in a log. 2) Take your medicines as prescribed 3) Eat low salt foods-Limit salt (sodium) to 2000mg  per day.  4) Stay as active as you can everyday

## 2011-08-22 NOTE — Assessment & Plan Note (Deleted)
NYHA II. She is doing quite well and continues to increase activity. Volume status stable. Will add lasix 20 mg daily as needed if her weight increases about 168 pounds. Restart carvedilol 3.125 mg bid. Follow up in 3 weeks. Will provide Dr Darcey Nora with an update.

## 2011-08-22 NOTE — Telephone Encounter (Signed)
[facility] and given to pharmacist for carvedilol, furosemide and zofran, pt's husband is aware

## 2011-08-22 NOTE — Progress Notes (Signed)
Patient ID: Bridget Martinez, female   DOB: [birth year], 60 y.o.   MRN: [id number] HPI:  60 y/o woman with DM2. Admitted 2 weeks ago with NSTEMI. Found to have EF 25% with severe CAD and cardiogenic shock. Treated with milrinone and aggressive diuresis.49 pound weight loss while hospitalized.  Continued to diurese well after diuretics stopped and developed ATN. Eventually she will have CABG per Dr Darcey Nora. Not placed on Carvedilol and Lasix upon discharge.   12/3  Discharge weight 165 pounds 08/12/11 Potassium 4.0 Creatinine 1.36  She is here for post hospitalization.  Weight at home 163-166. Intermittent nausea.  Denies SOB/PND/Othopnea/CP. Dizziness when she initially starts walking. HH continues with PT/RN. R and LLE wounds have healed. Mild lower extremity edema. She will follow up with Dr Darcey Nora for CABG.   Diabetic management per Dr Buddy Duty.  She did not take Carvedilol or Lasix after discharge.  ROS: All systems negative except as listed in HPI, PMH and Problem List.  Past Medical History  Diagnosis Date  . Diabetes mellitus     on insulin, with h/o DKA   . HTN (hypertension)   . Hyperlipidemia   . Venous stasis ulcers   . Q waves suggestive of previous myocardial infarction     Pt denies h/o AMI, cath, PCI  . Angina   . Tuberculosis   . Hyperthyroidism     Current Outpatient Prescriptions  Medication Sig Dispense Refill  . aspirin EC 325 MG EC tablet Take 1 tablet (325 mg total) by mouth daily.  30 tablet  0  . digoxin (LANOXIN) 0.25 MG tablet Take 250 mcg by mouth daily.        . enalapril (VASOTEC) 2.5 MG tablet Take 1 tablet (2.5 mg total) by mouth 2 (two) times daily with a meal.  30 tablet  0  . insulin aspart (NOVOLOG) 100 UNIT/ML injection Inject 5-10 Units into the skin 3 (three) times daily with meals. If blood glucose 70-140 mg/dL, then 3 units.  If blood glucose 141-180 mg/dL, then 4 units. If blood glucose 181-220 mg/dL, then 5 units. If blood glucose 221-260 mg/dL, then 6  units. If blood glucose 261-300 mg/dL, then 7 units. If blood glucose 301-340 mg/dL, then 8 units. If blood glucose 341-380 mg/dL, then 9 units. If blood glucose is greater than 381 then 10 units.       . insulin detemir (LEVEMIR) 100 UNIT/ML injection Inject 9 Units into the skin 2 (two) times daily.        Marland Kitchen levothyroxine (SYNTHROID, LEVOTHROID) 100 MCG tablet Take 100 mcg by mouth daily.        . pantoprazole (PROTONIX) 40 MG tablet Take 1 tablet (40 mg total) by mouth 2 (two) times daily before a meal.  60 tablet  0  . potassium chloride SA (K-DUR,KLOR-CON) 20 MEQ tablet Take 1 tablet (20 mEq total) by mouth daily.  30 tablet  0  . rosuvastatin (CRESTOR) 40 MG tablet Take 1 tablet (40 mg total) by mouth daily at 6 PM.  30 tablet  0  . carvedilol (COREG) 3.125 MG tablet Take 1 tablet (3.125 mg total) by mouth 2 (two) times daily with a meal.  60 tablet  0  . escitalopram (LEXAPRO) 10 MG tablet Take 10 mg by mouth daily.        . furosemide (LASIX) 20 MG tablet Take 1 tablet (20 mg total) by mouth daily.  30 tablet  0     PHYSICAL EXAM: Filed Vitals:  08/22/11 1239  BP: 106/50  Pulse: 85   Weight change:  General:  Well appearing. No resp difficulty HEENT: normal Neck: supple. JVP flat. Carotids 2+ bilaterally; no bruits. No lymphadenopathy or thryomegaly appreciated. Cor: PMI normal. Regular rate & rhythm. No rubs, gallops or murmurs. Lungs: clear Abdomen: soft, nontender, nondistended. No hepatosplenomegaly. No bruits or masses. Good bowel sounds. Extremities: no cyanosis, clubbing, rash, trace edema R and LLE wounds healed dry scales Neuro: alert & orientedx3, cranial nerves grossly intact. Moves all 4 extremities w/o difficulty. Affect pleasant.    ECG:   ASSESSMENT & PLAN:

## 2011-08-22 NOTE — Telephone Encounter (Signed)
Pt called this afternoon regarding her prescriptions she was ordered today.  Apparently the pharmacy does not have them all.  Thanks.

## 2011-08-25 DIAGNOSIS — I5022 Chronic systolic (congestive) heart failure: Secondary | ICD-10-CM | POA: Insufficient documentation

## 2011-08-25 NOTE — Assessment & Plan Note (Signed)
NYHA II. She is doing quite well and continues to increase activity. Volume status stable. Will add lasix 20 mg daily as needed if her weight increases about 168 pounds. Restart carvedilol 3.125 mg bid. Follow up in 3 weeks. Will provide Dr Darcey Nora with an update.   Patient seen and examined with Darrick Grinder, NP. We discussed all aspects of the encounter. I agree with the assessment and plan as stated above. She has made remarkable progress. Agree with restarting carvedilol. I have d/w Dr. Montez Hageman who will see her after th holidays to re-evaluate for CABG. Will need repeat echo to assess for improvement in LV function.

## 2011-09-05 ENCOUNTER — Ambulatory Visit (HOSPITAL_COMMUNITY)
Admission: RE | Admit: 2011-09-05 | Discharge: 2011-09-05 | Disposition: A | Discharge status: Home / Self Care | Payer: BC Managed Care – PPO | Source: Ambulatory Visit | Attending: Internal Medicine | Admitting: Internal Medicine

## 2011-09-05 DIAGNOSIS — I252 Old myocardial infarction: Secondary | ICD-10-CM | POA: Insufficient documentation

## 2011-09-05 DIAGNOSIS — E119 Type 2 diabetes mellitus without complications: Secondary | ICD-10-CM | POA: Insufficient documentation

## 2011-09-05 DIAGNOSIS — I10 Essential (primary) hypertension: Secondary | ICD-10-CM | POA: Insufficient documentation

## 2011-09-05 DIAGNOSIS — I059 Rheumatic mitral valve disease, unspecified: Secondary | ICD-10-CM

## 2011-09-05 DIAGNOSIS — I5023 Acute on chronic systolic (congestive) heart failure: Secondary | ICD-10-CM

## 2011-09-05 DIAGNOSIS — I379 Nonrheumatic pulmonary valve disorder, unspecified: Secondary | ICD-10-CM | POA: Insufficient documentation

## 2011-09-05 DIAGNOSIS — I079 Rheumatic tricuspid valve disease, unspecified: Secondary | ICD-10-CM | POA: Insufficient documentation

## 2011-09-16 ENCOUNTER — Encounter (HOSPITAL_COMMUNITY): Payer: BC Managed Care – PPO

## 2011-09-17 ENCOUNTER — Ambulatory Visit (HOSPITAL_COMMUNITY)
Admission: RE | Admit: 2011-09-17 | Discharge: 2011-09-17 | Disposition: A | Discharge status: Home / Self Care | Payer: BC Managed Care – PPO | Source: Ambulatory Visit | Attending: Internal Medicine | Admitting: Internal Medicine

## 2011-09-17 VITALS — BP 128/58 | HR 78 | Wt 163.5 lb

## 2011-09-17 DIAGNOSIS — I5022 Chronic systolic (congestive) heart failure: Secondary | ICD-10-CM

## 2011-09-17 DIAGNOSIS — I2510 Atherosclerotic heart disease of native coronary artery without angina pectoris: Secondary | ICD-10-CM

## 2011-09-17 MED ORDER — ENALAPRIL MALEATE 2.5 MG PO TABS: 2.5000 mg | ORAL_TABLET | Freq: Two times a day (BID) | ORAL | Status: DC

## 2011-09-17 MED ORDER — DIGOXIN 250 MCG PO TABS: 250.0000 ug | ORAL_TABLET | Freq: Every day | ORAL | Status: DC

## 2011-09-17 MED ORDER — ROSUVASTATIN CALCIUM 40 MG PO TABS: 40.0000 mg | ORAL_TABLET | Freq: Every day | ORAL | Status: DC

## 2011-09-17 MED ORDER — PANTOPRAZOLE SODIUM 40 MG PO TBEC: 40.0000 mg | DELAYED_RELEASE_TABLET | Freq: Two times a day (BID) | ORAL | Status: DC

## 2011-09-17 MED ORDER — CARVEDILOL 6.25 MG PO TABS: 6.2500 mg | ORAL_TABLET | Freq: Two times a day (BID) | ORAL | Status: DC

## 2011-09-17 NOTE — Assessment & Plan Note (Addendum)
Much more stable. EF remains depressed 20-25%. NYHA II. Volume status stable. Titrate beta blocker. Increase Carvedilol 6.25 mg bid. Discussed possible edema associated with beta blocker increase.  Follow up in 4 weeks.   Patient seen and examined with Darrick Grinder, NP. We discussed all aspects of the encounter. I agree with the assessment and plan as stated above.

## 2011-09-17 NOTE — Patient Instructions (Addendum)
Increase Coreg (Carvedilol) to 6.25 mg twice a day  Do the following things EVERYDAY: 1) Weigh yourself in the morning before breakfast. Write it down and keep it in a log. 2) Take your medicines as prescribed 3) Eat low salt foods-Limit salt (sodium) to 2000mg  per day.  4) Stay as active as you can everyday  Follow up in 4 weeks.

## 2011-09-17 NOTE — Progress Notes (Signed)
Patient ID: CHAROLOTTE GOUCHER, female   DOB: 1951-05-20, 61 y.o.   MRN: [id number] Patient ID: ALEEZAY MELCHERT, female   DOB: 08/26/1951, 61 y.o.   MRN: [id number] HPI:  61 y/o woman with DM2. Admitted 2 weeks ago with NSTEMI. Found to have EF 25% with severe CAD and cardiogenic shock. Treated with milrinone and aggressive diuresis.49 pound weight loss while hospitalized.  Continued to diurese well after diuretics stopped and developed ATN. Eventually she will have CABG per Dr Darcey Nora. Not placed on Carvedilol and Lasix upon discharge.   12/3  Discharge weight 165 pounds 08/12/11 Potassium 4.0 Creatinine 1.36  She is here for follow up. Feels good. Blood sugars better controlled. Occasional nausea. Denies SOB/PND/Orthopnea/CP. Recently  Finished home PT.   Weight at home 163-164. Denies dizziness. Mild lower extremity edema. She will follow up with Dr Darcey Nora for CABG evaluation.   Diabetic management per Dr Buddy Duty.    ROS: All systems negative except as listed in HPI, PMH and Problem List.  Past Medical History  Diagnosis Date  . Diabetes mellitus     on insulin, with h/o DKA   . HTN (hypertension)   . Hyperlipidemia   . Venous stasis ulcers   . Q waves suggestive of previous myocardial infarction     Pt denies h/o AMI, cath, PCI  . Angina   . Tuberculosis   . Hyperthyroidism     Current Outpatient Prescriptions  Medication Sig Dispense Refill  . aspirin 325 MG tablet Take 325 mg by mouth daily.        . carvedilol (COREG) 3.125 MG tablet Take 1 tablet (3.125 mg total) by mouth 2 (two) times daily with a meal.  60 tablet  0  . digoxin (LANOXIN) 0.25 MG tablet Take 250 mcg by mouth daily.        . enalapril (VASOTEC) 2.5 MG tablet Take 1 tablet (2.5 mg total) by mouth 2 (two) times daily with a meal.  30 tablet  0  . escitalopram (LEXAPRO) 10 MG tablet Take 10 mg by mouth daily.        . furosemide (LASIX) 20 MG tablet Take 1 tablet (20 mg total) by mouth as needed (only if weight is above  168 pounds).  30 tablet  6  . insulin aspart (NOVOLOG) 100 UNIT/ML injection Inject 5-10 Units into the skin 3 (three) times daily with meals. If blood glucose 70-140 mg/dL, then 3 units.  If blood glucose 141-180 mg/dL, then 4 units. If blood glucose 181-220 mg/dL, then 5 units. If blood glucose 221-260 mg/dL, then 6 units. If blood glucose 261-300 mg/dL, then 7 units. If blood glucose 301-340 mg/dL, then 8 units. If blood glucose 341-380 mg/dL, then 9 units. If blood glucose is greater than 381 then 10 units.       . insulin detemir (LEVEMIR) 100 UNIT/ML injection Inject 9 Units into the skin 2 (two) times daily.        Marland Kitchen levothyroxine (SYNTHROID, LEVOTHROID) 100 MCG tablet Take 100 mcg by mouth daily.        . pantoprazole (PROTONIX) 40 MG tablet Take 1 tablet (40 mg total) by mouth 2 (two) times daily before a meal.  60 tablet  0  . potassium chloride SA (K-DUR,KLOR-CON) 20 MEQ tablet Take 1 tablet (20 mEq total) by mouth daily.  30 tablet  0  . rosuvastatin (CRESTOR) 40 MG tablet Take 1 tablet (40 mg total) by mouth daily at 6 PM.  30 tablet  0     PHYSICAL EXAM: Filed Vitals:   09/17/11 1531  BP: 128/58  Pulse: 78   Weight change: 2 pound weight loss 163 pounds (165) General:  Well appearing. No resp difficulty HEENT: normal Neck: supple. JVP flat. Carotids 2+ bilaterally; no bruits. No lymphadenopathy or thryomegaly appreciated. Cor: PMI normal. Regular rate & rhythm. No rubs, gallops or murmurs. Lungs: clear Abdomen: soft, nontender, nondistended. No hepatosplenomegaly. No bruits or masses. Good bowel sounds. Extremities: no cyanosis, clubbing, rash, trace edema R and LLE wounds healed dry scales Neuro: alert & orientedx3, cranial nerves grossly intact. Moves all 4 extremities w/o difficulty. Affect pleasant.    ECG:   ASSESSMENT & PLAN:

## 2011-09-18 ENCOUNTER — Telehealth (HOSPITAL_COMMUNITY): Payer: Self-pay | Admitting: *Deleted

## 2011-09-18 MED ORDER — DIGOXIN 250 MCG PO TABS: 250.0000 ug | ORAL_TABLET | Freq: Every day | ORAL | Status: DC

## 2011-09-18 MED ORDER — ENALAPRIL MALEATE 2.5 MG PO TABS: 2.5000 mg | ORAL_TABLET | Freq: Two times a day (BID) | ORAL | Status: DC

## 2011-09-18 MED ORDER — CARVEDILOL 6.25 MG PO TABS: 6.2500 mg | ORAL_TABLET | Freq: Two times a day (BID) | ORAL | Status: DC

## 2011-09-18 MED ORDER — PANTOPRAZOLE SODIUM 40 MG PO TBEC: 40.0000 mg | DELAYED_RELEASE_TABLET | Freq: Two times a day (BID) | ORAL | Status: DC

## 2011-09-18 MED ORDER — ROSUVASTATIN CALCIUM 40 MG PO TABS: 40.0000 mg | ORAL_TABLET | Freq: Every day | ORAL | Status: DC

## 2011-09-18 NOTE — Telephone Encounter (Signed)
Prescriptions for all cardiac meds [facility], pt is aware

## 2011-09-18 NOTE — Telephone Encounter (Signed)
Bridget Martinez called today.  She has been unable to get her refills that were supposed to be faxed yesterday to her pharmacy.  Can you please follow up to be sure that the pharmacy has received them.  Thank you.

## 2011-09-19 ENCOUNTER — Encounter: Payer: Self-pay | Admitting: Cardiothoracic Surgery

## 2011-09-19 ENCOUNTER — Institutional Professional Consult (permissible substitution) (INDEPENDENT_AMBULATORY_CARE_PROVIDER_SITE_OTHER): Payer: BC Managed Care – PPO | Admitting: Cardiothoracic Surgery

## 2011-09-19 VITALS — BP 116/68 | HR 84 | Resp 18 | Ht 63.0 in | Wt 163.0 lb

## 2011-09-19 DIAGNOSIS — I878 Other specified disorders of veins: Secondary | ICD-10-CM

## 2011-09-19 DIAGNOSIS — I2510 Atherosclerotic heart disease of native coronary artery without angina pectoris: Secondary | ICD-10-CM

## 2011-09-19 DIAGNOSIS — I872 Venous insufficiency (chronic) (peripheral): Secondary | ICD-10-CM

## 2011-09-19 DIAGNOSIS — E119 Type 2 diabetes mellitus without complications: Secondary | ICD-10-CM

## 2011-09-19 DIAGNOSIS — I5023 Acute on chronic systolic (congestive) heart failure: Secondary | ICD-10-CM

## 2011-09-19 NOTE — Patient Instructions (Signed)
Continue current meds 

## 2011-09-19 NOTE — Progress Notes (Signed)
PCP is [name], MD Referring Provider is [name], Shaune Pascal, MD                                                   [address]            [place] [address]          [phone]       Chief Complaint  Patient presents with  . Coronary Artery Disease    Referral from Dr Haroldine Laws for surgical eval CABG    HPI: The patient is a 61 year old Caucasian female diabetic with ischemic cardiomyopathy returns for further discussion of surgical coronary revascularization. The patient was admitted in December with severe heart failure, low EF, and severe three-vessel coronary disease. She also had moderate mitral rotation and moderately elevated PA pressures. She was treated with excellent heart failure therapy and was diuresed over a 30 pounds. Her diabetes is under better control and her venous stasis ulcers of her lower extremities were treated with wound care and have a healed. She is now functional class III with easy fatigue but no bands heart failure symptoms. A followup echo last week showed her EF to be improved to 20 to pressure points 5-30%. There is still akinesia of the inferior wall. The mitral valve has mild regurgitation, also improved. Aortic valve is normal. The patient returns now for discussion of surgical revascularization as her overall medical condition is significantly improved since early December  The patient has had vein mapping showing adequate veins bilaterally. We would probably his thigh vein only. She's had a carotid duplex scan showing no significant carotid disease. She has had poor function test showing adequate PFTs. She has no history of smoking. She is not on Plavix and she is maintained sinus rhythm.   Past Medical History  Diagnosis Date  . Diabetes mellitus     on insulin, with h/o DKA   . HTN (hypertension)   . Hyperlipidemia   . Venous stasis ulcers   . Q waves suggestive of previous myocardial infarction     Pt denies h/o AMI, cath,  PCI  . Angina   . Tuberculosis   . Hyperthyroidism     Past Surgical History  Procedure Date  . No past surgeries   . Coronary artery bypass graft 07/29/2011    Procedure: CORONARY ARTERY BYPASS GRAFTING (CABG);  Surgeon: Tharon Aquas Adelene Idler, MD;  Location: [facility];  Service: Open Heart Surgery;  Laterality: N/A;    Family History  Problem Relation Age of Onset  . Heart disease Father   . Hypertension Mother   . Multiple sclerosis Father   . Hyperthyroidism Mother   . Diabetes Cousin     Multiple maternal cousins with type 2 diabetes mellitus  . Diabetes Maternal Uncle     Type 1 diabetes mellitus    Social History History  Substance Use Topics  . Smoking status: Never Smoker   . Smokeless tobacco: Not on file  . Alcohol Use: No    Current Outpatient Prescriptions  Medication Sig Dispense Refill  . aspirin 325 MG tablet Take 325 mg by mouth daily.        . carvedilol (COREG) 6.25 MG tablet Take 1 tablet (6.25 mg total) by mouth 2 (two) times daily with a meal.  60 tablet  6  . digoxin (LANOXIN) 0.25 MG tablet Take 1 tablet (250 mcg total) by mouth daily.  30 tablet  6  . enalapril (VASOTEC) 2.5 MG tablet Take 1 tablet (2.5 mg total) by mouth 2 (two) times daily with a meal.  60 tablet  6  . escitalopram (LEXAPRO) 10 MG tablet Take 10 mg by mouth daily.        . furosemide (LASIX) 20 MG tablet Take 1 tablet (20 mg total) by mouth as needed (only if weight is above 168 pounds).  30 tablet  6  . insulin aspart (NOVOLOG) 100 UNIT/ML injection Inject 5-10 Units into the skin 3 (three) times daily with meals. If blood glucose 70-140 mg/dL, then 3 units.  If blood glucose 141-180 mg/dL, then 4 units. If blood glucose 181-220 mg/dL, then 5 units. If blood glucose 221-260 mg/dL, then 6 units. If blood glucose 261-300 mg/dL, then 7 units. If blood glucose 301-340 mg/dL, then 8 units. If blood glucose 341-380 mg/dL, then 9 units. If blood glucose is greater than 381 then 10 units.         . insulin detemir (LEVEMIR) 100 UNIT/ML injection Inject 9 Units into the skin 2 (two) times daily.        Marland Kitchen levothyroxine (SYNTHROID, LEVOTHROID) 100 MCG tablet Take 100 mcg by mouth daily.        . pantoprazole (PROTONIX) 40 MG tablet Take 1 tablet (40 mg total) by mouth 2 (two) times daily before a meal.  60 tablet  6  . potassium chloride SA (K-DUR,KLOR-CON) 20 MEQ tablet Take 1 tablet (20 mEq total) by mouth daily.  30 tablet  0  . rosuvastatin (CRESTOR) 40 MG tablet Take 1 tablet (40 mg total) by mouth daily at 6 PM.  30 tablet  6    Allergies  Allergen Reactions  . Sulfa Antibiotics Anaphylaxis and Swelling    Review of Systems the patient has somewhat poor dentition with upper dental plates and some unhealthy-appearing lower mandibular teeth. The patient has class III symptoms of CHF. She denies classic angina. The patient denies previous major surgery other than tonsillectomy at age 88. She denies any thoracic trauma or pneumothorax. She denies any bleeding disorders or previous blood transfusions. She states her blood sugars ranged between 1:15 200. She's had a flu vaccine. She's had no recent symptoms of upper respiratory infection.  BP 116/68  Pulse 84  Resp 18  Ht 5\' 3"  (1.6 m)  Wt 163 lb (73.936 kg)  BMI 28.87 kg/m2  SpO2 99% Physical Exam General appearance very nice middle-aged female no acute distress HEENT normocephalic pupils equal dentition suboptimal Neck without JVD carotid bruit or mass Thorax without deformity clear breath sounds Cardiac regular rhythm positive S4 gallop no murmur Abdomen soft nontender Extremities some venous stasis changes but no ulceration below the knees mild edema Vascular positive pulses in extremities brachial pressures equal in each upper Chairman the Neurologic exam nonfocal normal gait able to walk easily  Diagnostic Tests: Cardiac cath and recent echo r reviewed. Her overall global function is an route to 30%. She is graftable  vessels. Her MR is mild. She'll be scheduled for multivessel bypass grafting on January 17  Impression: Severe CAD severe ischemic cardiomyopathy  Plan: Multivessel bypass grafting care 17. Procedure indications and risks discussed the patient and husband. They agree to proceed.

## 2011-09-20 ENCOUNTER — Encounter (HOSPITAL_COMMUNITY): Payer: Self-pay | Admitting: Pharmacy Technician

## 2011-09-20 ENCOUNTER — Encounter: Payer: BC Managed Care – PPO | Admitting: Cardiothoracic Surgery

## 2011-09-20 ENCOUNTER — Other Ambulatory Visit: Payer: Self-pay

## 2011-09-20 DIAGNOSIS — I2510 Atherosclerotic heart disease of native coronary artery without angina pectoris: Secondary | ICD-10-CM

## 2011-09-20 DIAGNOSIS — I059 Rheumatic mitral valve disease, unspecified: Secondary | ICD-10-CM

## 2011-09-24 ENCOUNTER — Encounter (HOSPITAL_COMMUNITY)
Admission: RE | Admit: 2011-09-24 | Discharge: 2011-09-24 | Disposition: A | Discharge status: Home / Self Care | Payer: BC Managed Care – PPO | Source: Ambulatory Visit | Attending: Cardiothoracic Surgery | Admitting: Cardiothoracic Surgery

## 2011-09-24 ENCOUNTER — Encounter (HOSPITAL_COMMUNITY): Payer: Self-pay

## 2011-09-24 ENCOUNTER — Other Ambulatory Visit: Payer: Self-pay

## 2011-09-24 DIAGNOSIS — I059 Rheumatic mitral valve disease, unspecified: Secondary | ICD-10-CM

## 2011-09-24 DIAGNOSIS — I2510 Atherosclerotic heart disease of native coronary artery without angina pectoris: Secondary | ICD-10-CM

## 2011-09-24 HISTORY — DX: Atherosclerotic heart disease of native coronary artery without angina pectoris: I25.10

## 2011-09-24 HISTORY — DX: Hypothyroidism, unspecified: E03.9

## 2011-09-24 HISTORY — DX: Unspecified osteoarthritis, unspecified site: M19.90

## 2011-09-24 HISTORY — DX: Gastro-esophageal reflux disease without esophagitis: K21.9

## 2011-09-24 HISTORY — DX: Major depressive disorder, single episode, unspecified: F32.9

## 2011-09-24 HISTORY — DX: Depression, unspecified: F32.A

## 2011-09-24 HISTORY — DX: Acute myocardial infarction, unspecified: I21.9

## 2011-09-24 LAB — URINE MICROSCOPIC-ADD ON

## 2011-09-24 LAB — URINALYSIS, ROUTINE W REFLEX MICROSCOPIC
Glucose, UA: 500 mg/dL — AB
Hgb urine dipstick: NEGATIVE
Ketones, ur: 15 mg/dL — AB
Nitrite: NEGATIVE
Protein, ur: 30 mg/dL — AB
Specific Gravity, Urine: 1.022 (ref 1.005–1.030)
Urobilinogen, UA: 1 mg/dL (ref 0.0–1.0)
pH: 6 (ref 5.0–8.0)

## 2011-09-24 LAB — SURGICAL PCR SCREEN
MRSA, PCR: NEGATIVE
Staphylococcus aureus: POSITIVE — AB

## 2011-09-24 LAB — COMPREHENSIVE METABOLIC PANEL
ALT: 26 U/L (ref 0–35)
AST: 54 U/L — ABNORMAL HIGH (ref 0–37)
Albumin: 3.2 g/dL — ABNORMAL LOW (ref 3.5–5.2)
Alkaline Phosphatase: 166 U/L — ABNORMAL HIGH (ref 39–117)
BUN: 8 mg/dL (ref 6–23)
CO2: 23 mEq/L (ref 19–32)
Calcium: 9.7 mg/dL (ref 8.4–10.5)
Chloride: 94 mEq/L — ABNORMAL LOW (ref 96–112)
Creatinine, Ser: 0.77 mg/dL (ref 0.50–1.10)
GFR calc Af Amer: >90 mL/min (ref >90)
GFR calc non Af Amer: 89 mL/min — ABNORMAL LOW (ref >90)
Glucose, Bld: 153 mg/dL — ABNORMAL HIGH (ref 70–99)
Potassium: 4.5 mEq/L (ref 3.5–5.1)
Sodium: 134 mEq/L — ABNORMAL LOW (ref 135–145)
Total Bilirubin: 0.5 mg/dL (ref 0.3–1.2)
Total Protein: 6.8 g/dL (ref 6.0–8.3)

## 2011-09-24 LAB — BLOOD GAS, ARTERIAL
Acid-Base Excess: 3.5 mmol/L — ABNORMAL HIGH (ref 0.0–2.0)
Bicarbonate: 26.9 mEq/L — ABNORMAL HIGH (ref 20.0–24.0)
Drawn by: 206361
FIO2: 0.21 %
O2 Saturation: 96.5 %
Patient temperature: 98.6
TCO2: 28 mmol/L (ref 0–100)
pCO2 arterial: 36.1 mmHg (ref 35.0–45.0)
pH, Arterial: 7.484 — ABNORMAL HIGH (ref 7.350–7.400)
pO2, Arterial: 83.2 mmHg (ref 80.0–100.0)

## 2011-09-24 LAB — TYPE AND SCREEN
ABO/RH(D): B POS
Antibody Screen: NEGATIVE

## 2011-09-24 LAB — CBC
HCT: 42.2 % (ref 36.0–46.0)
Hemoglobin: 14.3 g/dL (ref 12.0–15.0)
MCH: 29.2 pg (ref 26.0–34.0)
MCHC: 33.9 g/dL (ref 30.0–36.0)
MCV: 86.3 fL (ref 78.0–100.0)
Platelets: 281 10*3/uL (ref 150–400)
RBC: 4.89 MIL/uL (ref 3.87–5.11)
RDW: 15.1 % (ref 11.5–15.5)
WBC: 10.7 10*3/uL — ABNORMAL HIGH (ref 4.0–10.5)

## 2011-09-24 LAB — PROTIME-INR
INR: 1.05 (ref 0.00–1.49)
Prothrombin Time: 13.9 seconds (ref 11.6–15.2)

## 2011-09-24 LAB — APTT: aPTT: 27 seconds (ref 24–37)

## 2011-09-24 LAB — ABO/RH: ABO/RH(D): B POS

## 2011-09-24 LAB — HEMOGLOBIN A1C
Hgb A1c MFr Bld: 10.3 % — ABNORMAL HIGH (ref <5.7)
Mean Plasma Glucose: 249 mg/dL — ABNORMAL HIGH (ref <117)

## 2011-09-24 NOTE — Pre-Procedure Instructions (Signed)
[place]  09/24/2011   Your procedure is scheduled on: 09-26-2011 @ 7:30 AM Report to [facility] at 5:30 AM.  Call this number if you have problems the morning of surgery: [phone]   Remember:   Do not eat food:After Midnight.  May have clear liquids: up to 4 Hours before arrival.  Clear liquids include soda, tea, black coffee, apple or grape juice, broth.UNTIL 1:30 AM  Take these medicines the morning of surgery with A SIP OF WATER:COREG,LANOXIN,LEXAPRO,SYNTHROID,PROTONIX   Do not wear jewelry, make-up or nail polish.  Do not wear lotions, powders, or perfumes. You may wear deodorant.  Do not shave 48 hours prior to surgery.  Do not bring valuables to the hospital.  Contacts, dentures or bridgework may not be worn into surgery.  Leave suitcase in the car. After surgery it may be brought to your room.  For patients admitted to the hospital, checkout time is 11:00 AM the day of discharge.    Special Instructions: Incentive Spirometry - Practice and bring it with you on the day of surgery. and CHG Shower Use Special Wash: 1/2 bottle night before surgery and 1/2 bottle morning of surgery.   Please read over the following fact sheets that you were given: Blood Transfusion Information, Open Heart Packet, MRSA Information and Surgical Site Infection Prevention

## 2011-09-25 MED ORDER — PHENYLEPHRINE HCL 10 MG/ML IJ SOLN
30.0000 ug/min | INTRAVENOUS | Status: DC
  Filled 2011-09-25: qty 2

## 2011-09-25 MED ORDER — DOPAMINE-DEXTROSE 3.2-5 MG/ML-% IV SOLN: 2.0000 ug/kg/min | INTRAVENOUS | Status: DC

## 2011-09-25 MED ORDER — MAGNESIUM SULFATE 50 % IJ SOLN
40.0000 meq | INTRAMUSCULAR | Status: DC
  Filled 2011-09-25: qty 10

## 2011-09-25 MED ORDER — DEXTROSE 5 % IV SOLN
750.0000 mg | INTRAVENOUS | Status: DC
  Filled 2011-09-25: qty 750

## 2011-09-25 MED ORDER — SODIUM CHLORIDE 0.9 % IV SOLN: 0.1000 ug/kg/h | INTRAVENOUS | Status: DC

## 2011-09-25 MED ORDER — POTASSIUM CHLORIDE 2 MEQ/ML IV SOLN
80.0000 meq | INTRAVENOUS | Status: DC
  Filled 2011-09-25: qty 40

## 2011-09-25 MED ORDER — NITROGLYCERIN IN D5W 200-5 MCG/ML-% IV SOLN
2.0000 ug/min | INTRAVENOUS | Status: DC
  Filled 2011-09-25: qty 250

## 2011-09-25 MED ORDER — VERAPAMIL HCL 2.5 MG/ML IV SOLN
INTRAVENOUS | Status: DC
  Filled 2011-09-25: qty 2.5

## 2011-09-25 MED ORDER — SODIUM CHLORIDE 0.9 % IV SOLN
INTRAVENOUS | Status: DC
  Filled 2011-09-25: qty 40

## 2011-09-25 MED ORDER — SODIUM CHLORIDE 0.9 % IV SOLN
0.1000 ug/kg/h | INTRAVENOUS | Status: DC
  Filled 2011-09-25: qty 4

## 2011-09-25 MED ORDER — SODIUM CHLORIDE 0.9 % IV SOLN
INTRAVENOUS | Status: DC
  Filled 2011-09-25: qty 1

## 2011-09-25 MED ORDER — CHLORHEXIDINE GLUCONATE 4 % EX LIQD: 30.0000 mL | CUTANEOUS | Status: DC

## 2011-09-25 MED ORDER — EPINEPHRINE HCL 1 MG/ML IJ SOLN
0.5000 ug/min | INTRAVENOUS | Status: DC
  Filled 2011-09-25: qty 4

## 2011-09-25 MED ORDER — SODIUM CHLORIDE 0.9 % IV SOLN
1250.0000 mg | INTRAVENOUS | Status: DC
  Filled 2011-09-25: qty 1250

## 2011-09-25 MED ORDER — CEFUROXIME SODIUM 1.5 G IJ SOLR
1.5000 g | INTRAMUSCULAR | Status: DC
  Filled 2011-09-25: qty 1.5

## 2011-09-25 MED ORDER — DEXTROSE 5 % IV SOLN
30.0000 ug/min | INTRAVENOUS | Status: DC
  Filled 2011-09-25: qty 2

## 2011-09-25 MED ORDER — METOPROLOL TARTRATE 12.5 MG HALF TABLET: 12.5000 mg | ORAL_TABLET | Freq: Once | ORAL | Status: DC

## 2011-09-25 MED ORDER — DOPAMINE-DEXTROSE 3.2-5 MG/ML-% IV SOLN
2.0000 ug/kg/min | INTRAVENOUS | Status: DC
  Filled 2011-09-25: qty 250

## 2011-09-26 ENCOUNTER — Encounter (HOSPITAL_COMMUNITY): Payer: Self-pay | Admitting: *Deleted

## 2011-09-26 MED ORDER — PHENYLEPHRINE HCL 10 MG/ML IJ SOLN
30.0000 ug/min | INTRAMUSCULAR | Status: AC
  Filled 2011-09-26: qty 2

## 2011-09-26 MED ORDER — VERAPAMIL HCL 2.5 MG/ML IV SOLN
INTRAVENOUS | Status: AC
  Filled 2011-09-26 (×2): qty 2.5

## 2011-09-26 MED ORDER — POTASSIUM CHLORIDE 2 MEQ/ML IV SOLN
80.0000 meq | INTRAVENOUS | Status: AC
  Filled 2011-09-26: qty 40

## 2011-09-26 MED ORDER — DEXTROSE 5 % IV SOLN
750.0000 mg | INTRAVENOUS | Status: AC
  Filled 2011-09-26: qty 750

## 2011-09-26 MED ORDER — SODIUM CHLORIDE 0.9 % IV SOLN
INTRAVENOUS | Status: AC
  Filled 2011-09-26: qty 1

## 2011-09-26 MED ORDER — MAGNESIUM SULFATE 50 % IJ SOLN
40.0000 meq | INTRAMUSCULAR | Status: AC
  Filled 2011-09-26: qty 10

## 2011-09-26 MED ORDER — DEXTROSE 5 % IV SOLN
0.5000 ug/min | INTRAVENOUS | Status: AC
  Filled 2011-09-26: qty 4

## 2011-09-26 MED ORDER — DEXTROSE 5 % IV SOLN
1.5000 g | INTRAVENOUS | Status: AC
  Administered 2011-09-27: 1.5 g via INTRAVENOUS
  Administered 2011-09-27: .75 g via INTRAVENOUS
  Filled 2011-09-26: qty 1.5

## 2011-09-26 MED ORDER — NITROGLYCERIN IN D5W 200-5 MCG/ML-% IV SOLN
2.0000 ug/min | INTRAVENOUS | Status: AC
  Filled 2011-09-26: qty 250

## 2011-09-26 MED ORDER — SODIUM CHLORIDE 0.9 % IV SOLN
0.1000 ug/kg/h | INTRAVENOUS | Status: AC
  Filled 2011-09-26: qty 4

## 2011-09-26 MED ORDER — SODIUM CHLORIDE 0.9 % IV SOLN
INTRAVENOUS | Status: AC
  Filled 2011-09-26: qty 40

## 2011-09-26 MED ORDER — VANCOMYCIN HCL 1000 MG IV SOLR
1250.0000 mg | INTRAVENOUS | Status: AC
  Administered 2011-09-27: 1250 mg via INTRAVENOUS
  Filled 2011-09-26: qty 1250

## 2011-09-26 MED ORDER — DOPAMINE-DEXTROSE 3.2-5 MG/ML-% IV SOLN
2.0000 ug/kg/min | INTRAVENOUS | Status: AC
  Filled 2011-09-26: qty 250

## 2011-09-27 ENCOUNTER — Encounter (HOSPITAL_COMMUNITY)
Admission: RE | Disposition: A | Discharge status: Home Health | Payer: Self-pay | Source: Ambulatory Visit | Attending: Cardiothoracic Surgery

## 2011-09-27 ENCOUNTER — Encounter (HOSPITAL_COMMUNITY): Payer: Self-pay | Admitting: Surgery

## 2011-09-27 ENCOUNTER — Other Ambulatory Visit: Payer: Self-pay | Admitting: Cardiothoracic Surgery

## 2011-09-27 ENCOUNTER — Ambulatory Visit (HOSPITAL_COMMUNITY): Payer: BC Managed Care – PPO | Admitting: Anesthesiology

## 2011-09-27 ENCOUNTER — Encounter (HOSPITAL_COMMUNITY): Payer: Self-pay | Admitting: Anesthesiology

## 2011-09-27 ENCOUNTER — Inpatient Hospital Stay (HOSPITAL_COMMUNITY)
Admission: RE | Admit: 2011-09-27 | Discharge: 2011-10-02 | DRG: 546 | Disposition: A | Discharge status: Home Health | Payer: BC Managed Care – PPO | Source: Ambulatory Visit | Attending: Cardiothoracic Surgery | Admitting: Cardiothoracic Surgery

## 2011-09-27 ENCOUNTER — Inpatient Hospital Stay (HOSPITAL_COMMUNITY): Payer: BC Managed Care – PPO

## 2011-09-27 ENCOUNTER — Other Ambulatory Visit: Payer: Self-pay

## 2011-09-27 DIAGNOSIS — I509 Heart failure, unspecified: Secondary | ICD-10-CM | POA: Diagnosis present

## 2011-09-27 DIAGNOSIS — I2589 Other forms of chronic ischemic heart disease: Secondary | ICD-10-CM | POA: Diagnosis present

## 2011-09-27 DIAGNOSIS — E785 Hyperlipidemia, unspecified: Secondary | ICD-10-CM | POA: Diagnosis present

## 2011-09-27 DIAGNOSIS — E119 Type 2 diabetes mellitus without complications: Secondary | ICD-10-CM | POA: Diagnosis present

## 2011-09-27 DIAGNOSIS — Z794 Long term (current) use of insulin: Secondary | ICD-10-CM

## 2011-09-27 DIAGNOSIS — I059 Rheumatic mitral valve disease, unspecified: Secondary | ICD-10-CM

## 2011-09-27 DIAGNOSIS — D62 Acute posthemorrhagic anemia: Secondary | ICD-10-CM | POA: Diagnosis not present

## 2011-09-27 DIAGNOSIS — I2510 Atherosclerotic heart disease of native coronary artery without angina pectoris: Principal | ICD-10-CM | POA: Diagnosis present

## 2011-09-27 DIAGNOSIS — Z01812 Encounter for preprocedural laboratory examination: Secondary | ICD-10-CM

## 2011-09-27 DIAGNOSIS — I5022 Chronic systolic (congestive) heart failure: Secondary | ICD-10-CM

## 2011-09-27 DIAGNOSIS — Z7902 Long term (current) use of antithrombotics/antiplatelets: Secondary | ICD-10-CM

## 2011-09-27 DIAGNOSIS — Z7982 Long term (current) use of aspirin: Secondary | ICD-10-CM

## 2011-09-27 DIAGNOSIS — Z79899 Other long term (current) drug therapy: Secondary | ICD-10-CM

## 2011-09-27 DIAGNOSIS — I10 Essential (primary) hypertension: Secondary | ICD-10-CM | POA: Diagnosis present

## 2011-09-27 LAB — POCT I-STAT 3, ART BLOOD GAS (G3+)
Acid-Base Excess: 2 mmol/L (ref 0.0–2.0)
Acid-base deficit: 2 mmol/L (ref 0.0–2.0)
Bicarbonate: 23.7 mEq/L (ref 20.0–24.0)
Bicarbonate: 22.6 mEq/L (ref 20.0–24.0)
Bicarbonate: 24.3 mEq/L — ABNORMAL HIGH (ref 20.0–24.0)
Bicarbonate: 24.2 mEq/L — ABNORMAL HIGH (ref 20.0–24.0)
O2 Saturation: 100 %
O2 Saturation: 99 %
O2 Saturation: 100 %
O2 Saturation: 99 %
Patient temperature: 96.3
Patient temperature: 36.6
Patient temperature: 36.5
TCO2: 25 mmol/L (ref 0–100)
TCO2: 24 mmol/L (ref 0–100)
TCO2: 25 mmol/L (ref 0–100)
TCO2: 26 mmol/L (ref 0–100)
pCO2 arterial: 32.5 mmHg — ABNORMAL LOW (ref 35.0–45.0)
pCO2 arterial: 28.9 mmHg — ABNORMAL LOW (ref 35.0–45.0)
pCO2 arterial: 28.8 mmHg — ABNORMAL LOW (ref 35.0–45.0)
pCO2 arterial: 48.2 mmHg — ABNORMAL HIGH (ref 35.0–45.0)
pH, Arterial: 7.471 — ABNORMAL HIGH (ref 7.350–7.400)
pH, Arterial: 7.497 — ABNORMAL HIGH (ref 7.350–7.400)
pH, Arterial: 7.532 — ABNORMAL HIGH (ref 7.350–7.400)
pH, Arterial: 7.306 — ABNORMAL LOW (ref 7.350–7.400)
pO2, Arterial: 276 mmHg — ABNORMAL HIGH (ref 80.0–100.0)
pO2, Arterial: 102 mmHg — ABNORMAL HIGH (ref 80.0–100.0)
pO2, Arterial: 163 mmHg — ABNORMAL HIGH (ref 80.0–100.0)
pO2, Arterial: 153 mmHg — ABNORMAL HIGH (ref 80.0–100.0)

## 2011-09-27 LAB — GLUCOSE, CAPILLARY
Glucose-Capillary: 200 mg/dL — ABNORMAL HIGH (ref 70–99)
Glucose-Capillary: 110 mg/dL — ABNORMAL HIGH (ref 70–99)
Glucose-Capillary: 113 mg/dL — ABNORMAL HIGH (ref 70–99)
Glucose-Capillary: 99 mg/dL (ref 70–99)
Glucose-Capillary: 100 mg/dL — ABNORMAL HIGH (ref 70–99)
Glucose-Capillary: 90 mg/dL (ref 70–99)
Glucose-Capillary: 98 mg/dL (ref 70–99)
Glucose-Capillary: 242 mg/dL — ABNORMAL HIGH (ref 70–99)

## 2011-09-27 LAB — POCT I-STAT 4, (NA,K, GLUC, HGB,HCT)
Glucose, Bld: 260 mg/dL — ABNORMAL HIGH (ref 70–99)
Glucose, Bld: 205 mg/dL — ABNORMAL HIGH (ref 70–99)
Glucose, Bld: 158 mg/dL — ABNORMAL HIGH (ref 70–99)
Glucose, Bld: 167 mg/dL — ABNORMAL HIGH (ref 70–99)
Glucose, Bld: 131 mg/dL — ABNORMAL HIGH (ref 70–99)
HCT: 36 % (ref 36.0–46.0)
HCT: 35 % — ABNORMAL LOW (ref 36.0–46.0)
HCT: 24 % — ABNORMAL LOW (ref 36.0–46.0)
HCT: 27 % — ABNORMAL LOW (ref 36.0–46.0)
HCT: 30 % — ABNORMAL LOW (ref 36.0–46.0)
Hemoglobin: 12.2 g/dL (ref 12.0–15.0)
Hemoglobin: 11.9 g/dL — ABNORMAL LOW (ref 12.0–15.0)
Hemoglobin: 8.2 g/dL — ABNORMAL LOW (ref 12.0–15.0)
Hemoglobin: 9.2 g/dL — ABNORMAL LOW (ref 12.0–15.0)
Hemoglobin: 10.2 g/dL — ABNORMAL LOW (ref 12.0–15.0)
Potassium: 3.6 mEq/L (ref 3.5–5.1)
Potassium: 2.9 mEq/L — ABNORMAL LOW (ref 3.5–5.1)
Potassium: 2.7 mEq/L — CL (ref 3.5–5.1)
Potassium: 3.3 mEq/L — ABNORMAL LOW (ref 3.5–5.1)
Potassium: 3 mEq/L — ABNORMAL LOW (ref 3.5–5.1)
Sodium: 132 mEq/L — ABNORMAL LOW (ref 135–145)
Sodium: 133 mEq/L — ABNORMAL LOW (ref 135–145)
Sodium: 130 mEq/L — ABNORMAL LOW (ref 135–145)
Sodium: 135 mEq/L (ref 135–145)
Sodium: 136 mEq/L (ref 135–145)

## 2011-09-27 LAB — APTT: aPTT: 33 seconds (ref 24–37)

## 2011-09-27 LAB — HEMOGLOBIN AND HEMATOCRIT, BLOOD
HCT: 27.4 % — ABNORMAL LOW (ref 36.0–46.0)
Hemoglobin: 9.2 g/dL — ABNORMAL LOW (ref 12.0–15.0)

## 2011-09-27 LAB — PREPARE PLATELET PHERESIS: Unit division: 0

## 2011-09-27 LAB — POCT I-STAT, CHEM 8
BUN: 6 mg/dL (ref 6–23)
Calcium, Ion: 1.2 mmol/L (ref 1.12–1.32)
Chloride: 103 mEq/L (ref 96–112)
Creatinine, Ser: 0.8 mg/dL (ref 0.50–1.10)
Glucose, Bld: 100 mg/dL — ABNORMAL HIGH (ref 70–99)
HCT: 31 % — ABNORMAL LOW (ref 36.0–46.0)
Hemoglobin: 10.5 g/dL — ABNORMAL LOW (ref 12.0–15.0)
Potassium: 3.6 mEq/L (ref 3.5–5.1)
Sodium: 138 mEq/L (ref 135–145)
TCO2: 25 mmol/L (ref 0–100)

## 2011-09-27 LAB — CBC
HCT: 31.1 % — ABNORMAL LOW (ref 36.0–46.0)
Hemoglobin: 10.4 g/dL — ABNORMAL LOW (ref 12.0–15.0)
MCV: 85.2 fL (ref 78.0–100.0)
WBC: 13.6 10*3/uL — ABNORMAL HIGH (ref 4.0–10.5)

## 2011-09-27 LAB — POCT I-STAT GLUCOSE
Glucose, Bld: 126 mg/dL — ABNORMAL HIGH (ref 70–99)
Operator id: 3406

## 2011-09-27 LAB — PROTIME-INR: INR: 1.39 (ref 0.00–1.49)

## 2011-09-27 LAB — PLATELET COUNT: Platelets: 177 10*3/uL (ref 150–400)

## 2011-09-27 SURGERY — CORONARY ARTERY BYPASS GRAFTING (CABG)
Anesthesia: General | Site: Chest | Wound class: Clean

## 2011-09-27 MED ORDER — ALBUMIN HUMAN 5 % IV SOLN
INTRAVENOUS | Status: DC | PRN
  Administered 2011-09-27: 13:00:00 via INTRAVENOUS

## 2011-09-27 MED ORDER — HEMOSTATIC AGENTS (NO CHARGE) OPTIME
TOPICAL | Status: DC | PRN
  Administered 2011-09-27: 1 via TOPICAL

## 2011-09-27 MED ORDER — MAGNESIUM SULFATE 40 MG/ML IJ SOLN
4.0000 g | Freq: Once | INTRAMUSCULAR | Status: AC
  Administered 2011-09-27: 4 g via INTRAVENOUS
  Filled 2011-09-27: qty 100

## 2011-09-27 MED ORDER — BISACODYL 5 MG PO TBEC
10.0000 mg | DELAYED_RELEASE_TABLET | Freq: Every day | ORAL | Status: DC
  Administered 2011-09-29 – 2011-09-30 (×2): 10 mg via ORAL
  Filled 2011-09-27 (×2): qty 2

## 2011-09-27 MED ORDER — POTASSIUM CHLORIDE 10 MEQ/50ML IV SOLN: 10.0000 meq | INTRAVENOUS | Status: DC

## 2011-09-27 MED ORDER — MIDAZOLAM HCL 5 MG/5ML IJ SOLN
INTRAMUSCULAR | Status: DC | PRN
  Administered 2011-09-27 (×4): 2 mg via INTRAVENOUS

## 2011-09-27 MED ORDER — SODIUM CHLORIDE 0.9 % IV SOLN
INTRAVENOUS | Status: DC
  Filled 2011-09-27: qty 1

## 2011-09-27 MED ORDER — BISACODYL 10 MG RE SUPP: 10.0000 mg | Freq: Every day | RECTAL | Status: DC

## 2011-09-27 MED ORDER — CEFUROXIME SODIUM 1.5 G IJ SOLR
1.5000 g | Freq: Two times a day (BID) | INTRAMUSCULAR | Status: AC
  Administered 2011-09-27 – 2011-09-29 (×4): 1.5 g via INTRAVENOUS
  Filled 2011-09-27 (×4): qty 1.5

## 2011-09-27 MED ORDER — THROMBIN 5000 UNITS EX KIT
PACK | CUTANEOUS | Status: DC | PRN
  Administered 2011-09-27: 5000 [IU] via TOPICAL

## 2011-09-27 MED ORDER — MILRINONE IN DEXTROSE 200-5 MCG/ML-% IV SOLN
INTRAVENOUS | Status: DC | PRN
  Administered 2011-09-27: 0.375 ug/kg/min via INTRAVENOUS

## 2011-09-27 MED ORDER — SODIUM CHLORIDE 0.9 % IJ SOLN
3.0000 mL | INTRAMUSCULAR | Status: DC | PRN
  Administered 2011-09-27 – 2011-09-28 (×2): 3 mL via INTRAVENOUS
  Administered 2011-09-29: 10 mL via INTRAVENOUS

## 2011-09-27 MED ORDER — HEPARIN SODIUM (PORCINE) 1000 UNIT/ML IJ SOLN
INTRAMUSCULAR | Status: DC | PRN
  Administered 2011-09-27: 5000 [IU] via INTRAVENOUS
  Administered 2011-09-27: 20000 [IU] via INTRAVENOUS

## 2011-09-27 MED ORDER — METOPROLOL TARTRATE 25 MG/10 ML ORAL SUSPENSION
12.5000 mg | Freq: Two times a day (BID) | ORAL | Status: DC
  Filled 2011-09-27 (×3): qty 5

## 2011-09-27 MED ORDER — METOPROLOL TARTRATE 12.5 MG HALF TABLET
12.5000 mg | ORAL_TABLET | Freq: Two times a day (BID) | ORAL | Status: DC
  Filled 2011-09-27 (×7): qty 1

## 2011-09-27 MED ORDER — OXYCODONE HCL 5 MG PO TABS
5.0000 mg | ORAL_TABLET | ORAL | Status: DC | PRN
  Administered 2011-09-28 – 2011-09-29 (×3): 5 mg via ORAL
  Filled 2011-09-27 (×3): qty 1

## 2011-09-27 MED ORDER — ASPIRIN EC 325 MG PO TBEC: 325.0000 mg | DELAYED_RELEASE_TABLET | Freq: Every day | ORAL | Status: DC

## 2011-09-27 MED ORDER — MILRINONE IN DEXTROSE 200-5 MCG/ML-% IV SOLN
0.1250 ug/kg/min | INTRAVENOUS | Status: DC
  Filled 2011-09-27: qty 100

## 2011-09-27 MED ORDER — CLOPIDOGREL BISULFATE 75 MG PO TABS
75.0000 mg | ORAL_TABLET | Freq: Every day | ORAL | Status: DC
  Administered 2011-09-29 – 2011-10-02 (×4): 75 mg via ORAL
  Filled 2011-09-27 (×6): qty 1

## 2011-09-27 MED ORDER — POTASSIUM CHLORIDE 10 MEQ/50ML IV SOLN
10.0000 meq | INTRAVENOUS | Status: DC
  Administered 2011-09-27: 10 meq via INTRAVENOUS

## 2011-09-27 MED ORDER — DOCUSATE SODIUM 100 MG PO CAPS
200.0000 mg | ORAL_CAPSULE | Freq: Every day | ORAL | Status: DC
  Administered 2011-09-29 – 2011-09-30 (×2): 200 mg via ORAL
  Filled 2011-09-27 (×4): qty 2

## 2011-09-27 MED ORDER — FAMOTIDINE IN NACL 20-0.9 MG/50ML-% IV SOLN
20.0000 mg | Freq: Two times a day (BID) | INTRAVENOUS | Status: AC
  Administered 2011-09-27 (×2): 20 mg via INTRAVENOUS
  Filled 2011-09-27: qty 50

## 2011-09-27 MED ORDER — PROTAMINE SULFATE 10 MG/ML IV SOLN
INTRAVENOUS | Status: DC | PRN
  Administered 2011-09-27: 60 mg via INTRAVENOUS
  Administered 2011-09-27: 50 mg via INTRAVENOUS
  Administered 2011-09-27: 60 mg via INTRAVENOUS
  Administered 2011-09-27: 10 mg via INTRAVENOUS
  Administered 2011-09-27: 50 mg via INTRAVENOUS

## 2011-09-27 MED ORDER — DEXTROSE 5 % IV SOLN: 1.5000 g | INTRAVENOUS | Status: DC

## 2011-09-27 MED ORDER — PHENYLEPHRINE HCL 10 MG/ML IJ SOLN
0.0000 ug/min | INTRAVENOUS | Status: DC
  Administered 2011-09-27: 20 ug/min via INTRAVENOUS
  Administered 2011-09-29: 7 ug/min via INTRAVENOUS
  Administered 2011-09-30: 5 ug/min via INTRAVENOUS
  Filled 2011-09-27 (×5): qty 2

## 2011-09-27 MED ORDER — LACTATED RINGERS IV SOLN
INTRAVENOUS | Status: DC
  Administered 2011-09-27: 20 mL/h via INTRAVENOUS

## 2011-09-27 MED ORDER — VERAPAMIL HCL 2.5 MG/ML IV SOLN
INTRAVENOUS | Status: DC | PRN
  Administered 2011-09-27: 10 mg via INTRAVENOUS

## 2011-09-27 MED ORDER — METOPROLOL TARTRATE 1 MG/ML IV SOLN: 2.5000 mg | INTRAVENOUS | Status: DC | PRN

## 2011-09-27 MED ORDER — SODIUM CHLORIDE 0.9 % IV SOLN: 250.0000 mL | INTRAVENOUS | Status: DC

## 2011-09-27 MED ORDER — ROSUVASTATIN CALCIUM 40 MG PO TABS
40.0000 mg | ORAL_TABLET | Freq: Every day | ORAL | Status: DC
  Administered 2011-09-28 – 2011-10-01 (×3): 40 mg via ORAL
  Filled 2011-09-27 (×6): qty 1

## 2011-09-27 MED ORDER — LIDOCAINE HCL (CARDIAC) 20 MG/ML IV SOLN
INTRAVENOUS | Status: DC | PRN
  Administered 2011-09-27: 50 mg via INTRAVENOUS

## 2011-09-27 MED ORDER — ACETAMINOPHEN 160 MG/5ML PO SOLN
975.0000 mg | Freq: Four times a day (QID) | ORAL | Status: DC
  Administered 2011-09-27 – 2011-09-28 (×2): 975 mg
  Filled 2011-09-27 (×3): qty 40.6

## 2011-09-27 MED ORDER — LACTATED RINGERS IV SOLN
INTRAVENOUS | Status: DC | PRN
  Administered 2011-09-27: 07:00:00 via INTRAVENOUS

## 2011-09-27 MED ORDER — PANTOPRAZOLE SODIUM 40 MG PO TBEC
40.0000 mg | DELAYED_RELEASE_TABLET | Freq: Every day | ORAL | Status: DC
  Administered 2011-09-29 – 2011-10-01 (×3): 40 mg via ORAL
  Filled 2011-09-27 (×4): qty 1

## 2011-09-27 MED ORDER — ESCITALOPRAM OXALATE 10 MG PO TABS
10.0000 mg | ORAL_TABLET | Freq: Every day | ORAL | Status: DC
  Administered 2011-09-29 – 2011-10-02 (×4): 10 mg via ORAL
  Filled 2011-09-27 (×5): qty 1

## 2011-09-27 MED ORDER — ACETAMINOPHEN 160 MG/5ML PO SOLN: 650.0000 mg | ORAL | Status: AC

## 2011-09-27 MED ORDER — SODIUM CHLORIDE 0.9 % IV SOLN
100.0000 [IU] | INTRAVENOUS | Status: DC | PRN
  Administered 2011-09-27: 6 [IU]/h via INTRAVENOUS

## 2011-09-27 MED ORDER — LACTATED RINGERS IV SOLN: 500.0000 mL | Freq: Once | INTRAVENOUS | Status: AC | PRN

## 2011-09-27 MED ORDER — SODIUM CHLORIDE 0.9 % IV SOLN
1000.0000 mg | Freq: Once | INTRAVENOUS | Status: AC
  Administered 2011-09-27: 1000 mg via INTRAVENOUS
  Filled 2011-09-27: qty 1000

## 2011-09-27 MED ORDER — LEVOTHYROXINE SODIUM 100 MCG PO TABS
100.0000 ug | ORAL_TABLET | Freq: Every day | ORAL | Status: DC
  Administered 2011-09-29 – 2011-10-02 (×4): 100 ug via ORAL
  Filled 2011-09-27 (×6): qty 1

## 2011-09-27 MED ORDER — DOPAMINE-DEXTROSE 3.2-5 MG/ML-% IV SOLN
0.0000 ug/kg/min | INTRAVENOUS | Status: DC
  Filled 2011-09-27: qty 250

## 2011-09-27 MED ORDER — FENTANYL CITRATE 0.05 MG/ML IJ SOLN
INTRAMUSCULAR | Status: DC | PRN
  Administered 2011-09-27: 500 ug via INTRAVENOUS
  Administered 2011-09-27 (×2): 250 ug via INTRAVENOUS

## 2011-09-27 MED ORDER — INSULIN ASPART 100 UNIT/ML ~~LOC~~ SOLN
0.0000 [IU] | SUBCUTANEOUS | Status: AC
  Administered 2011-09-27 – 2011-09-28 (×2): 8 [IU] via SUBCUTANEOUS
  Administered 2011-09-28: 4 [IU] via SUBCUTANEOUS
  Filled 2011-09-27: qty 3

## 2011-09-27 MED ORDER — SODIUM CHLORIDE 0.9 % IR SOLN
Status: DC | PRN
  Administered 2011-09-27: 1000 mL

## 2011-09-27 MED ORDER — ONDANSETRON HCL 4 MG/2ML IJ SOLN
4.0000 mg | Freq: Four times a day (QID) | INTRAMUSCULAR | Status: DC | PRN
  Administered 2011-09-29 – 2011-09-30 (×2): 4 mg via INTRAVENOUS
  Filled 2011-09-27 (×2): qty 2

## 2011-09-27 MED ORDER — ALBUMIN HUMAN 5 % IV SOLN
250.0000 mL | INTRAVENOUS | Status: AC | PRN
  Administered 2011-09-27 – 2011-09-28 (×4): 250 mL via INTRAVENOUS
  Filled 2011-09-27 (×2): qty 250

## 2011-09-27 MED ORDER — VERAPAMIL HCL 2.5 MG/ML IV SOLN
10.0000 mg | INTRAVENOUS | Status: DC
  Filled 2011-09-27: qty 4

## 2011-09-27 MED ORDER — DOPAMINE-DEXTROSE 1.6-5 MG/ML-% IV SOLN
INTRAVENOUS | Status: DC | PRN
  Administered 2011-09-27: 2 ug/kg/min via INTRAVENOUS

## 2011-09-27 MED ORDER — SODIUM CHLORIDE 0.9 % IV SOLN: INTRAVENOUS | Status: DC

## 2011-09-27 MED ORDER — MORPHINE SULFATE 4 MG/ML IJ SOLN: 2.0000 mg | INTRAMUSCULAR | Status: DC | PRN

## 2011-09-27 MED ORDER — MIDAZOLAM HCL 2 MG/2ML IJ SOLN: 2.0000 mg | INTRAMUSCULAR | Status: DC | PRN

## 2011-09-27 MED ORDER — SODIUM CHLORIDE 0.9 % IJ SOLN
3.0000 mL | Freq: Two times a day (BID) | INTRAMUSCULAR | Status: DC
  Administered 2011-09-28: 3 mL via INTRAVENOUS
  Administered 2011-09-28: 10 mL via INTRAVENOUS
  Administered 2011-09-29 (×2): 3 mL via INTRAVENOUS

## 2011-09-27 MED ORDER — ACETAMINOPHEN 500 MG PO TABS
1000.0000 mg | ORAL_TABLET | Freq: Four times a day (QID) | ORAL | Status: DC
  Administered 2011-09-28 – 2011-10-02 (×13): 1000 mg via ORAL
  Filled 2011-09-27 (×19): qty 2

## 2011-09-27 MED ORDER — ROCURONIUM BROMIDE 100 MG/10ML IV SOLN
INTRAVENOUS | Status: DC | PRN
  Administered 2011-09-27: 50 mg via INTRAVENOUS
  Administered 2011-09-27 (×3): 10 mg via INTRAVENOUS
  Administered 2011-09-27: 40 mg via INTRAVENOUS

## 2011-09-27 MED ORDER — NITROGLYCERIN IN D5W 200-5 MCG/ML-% IV SOLN
INTRAVENOUS | Status: DC | PRN
  Administered 2011-09-27: 10 ug/min via INTRAVENOUS

## 2011-09-27 MED ORDER — ASPIRIN 81 MG PO CHEW: 324.0000 mg | CHEWABLE_TABLET | Freq: Every day | ORAL | Status: DC

## 2011-09-27 MED ORDER — ASPIRIN EC 325 MG PO TBEC
325.0000 mg | DELAYED_RELEASE_TABLET | Freq: Every day | ORAL | Status: DC
  Administered 2011-09-29: 325 mg via ORAL
  Filled 2011-09-27 (×3): qty 1

## 2011-09-27 MED ORDER — SODIUM CHLORIDE 0.45 % IV SOLN
INTRAVENOUS | Status: DC
  Administered 2011-09-27: 15:00:00 via INTRAVENOUS

## 2011-09-27 MED ORDER — SODIUM CHLORIDE 0.9 % IV SOLN
5.0000 g | INTRAVENOUS | Status: DC
  Filled 2011-09-27: qty 20

## 2011-09-27 MED ORDER — MILRINONE IN DEXTROSE 200-5 MCG/ML-% IV SOLN
0.1250 ug/kg/min | INTRAVENOUS | Status: AC
  Administered 2011-09-27: 0.3 ug/kg/min via INTRAVENOUS
  Filled 2011-09-27 (×2): qty 100

## 2011-09-27 MED ORDER — ASPIRIN 81 MG PO CHEW: 81.0000 mg | CHEWABLE_TABLET | Freq: Every day | ORAL | Status: DC

## 2011-09-27 MED ORDER — SODIUM CHLORIDE 0.9 % IV SOLN
0.1000 ug/kg/h | INTRAVENOUS | Status: DC
  Filled 2011-09-27 (×2): qty 2

## 2011-09-27 MED ORDER — SODIUM CHLORIDE 0.9 % IV SOLN
200.0000 ug | INTRAVENOUS | Status: DC | PRN
  Administered 2011-09-27: 0.2 ug/kg/h via INTRAVENOUS

## 2011-09-27 MED ORDER — INSULIN ASPART 100 UNIT/ML ~~LOC~~ SOLN
0.0000 [IU] | SUBCUTANEOUS | Status: DC
  Administered 2011-09-28: 8 [IU] via SUBCUTANEOUS
  Administered 2011-09-28: 12 [IU] via SUBCUTANEOUS
  Administered 2011-09-28 – 2011-09-29 (×3): 2 [IU] via SUBCUTANEOUS
  Administered 2011-09-29: 8 [IU] via SUBCUTANEOUS
  Filled 2011-09-27: qty 3

## 2011-09-27 MED ORDER — SODIUM CHLORIDE 0.9 % IV SOLN
10.0000 g | INTRAVENOUS | Status: DC | PRN
  Administered 2011-09-27: 5 g/h via INTRAVENOUS

## 2011-09-27 MED ORDER — INSULIN REGULAR BOLUS VIA INFUSION
0.0000 [IU] | Freq: Three times a day (TID) | INTRAVENOUS | Status: DC
  Filled 2011-09-27 (×2): qty 10

## 2011-09-27 MED ORDER — MORPHINE SULFATE 2 MG/ML IJ SOLN: 1.0000 mg | INTRAMUSCULAR | Status: AC | PRN

## 2011-09-27 MED ORDER — POTASSIUM CHLORIDE 10 MEQ/50ML IV SOLN
10.0000 meq | INTRAVENOUS | Status: AC
  Administered 2011-09-27 (×3): 10 meq via INTRAVENOUS
  Filled 2011-09-27: qty 50

## 2011-09-27 MED ORDER — CALCIUM CHLORIDE 10 % IV SOLN
INTRAVENOUS | Status: DC | PRN
  Administered 2011-09-27: 0.5 g via INTRAVENOUS

## 2011-09-27 MED ORDER — ACETAMINOPHEN 650 MG RE SUPP
650.0000 mg | RECTAL | Status: AC
  Administered 2011-09-27: 650 mg via RECTAL

## 2011-09-27 MED ORDER — NITROGLYCERIN IN D5W 200-5 MCG/ML-% IV SOLN: 0.0000 ug/min | INTRAVENOUS | Status: DC

## 2011-09-27 MED ORDER — POTASSIUM CHLORIDE 10 MEQ/50ML IV SOLN
10.0000 meq | INTRAVENOUS | Status: AC
  Administered 2011-09-27 (×3): 10 meq via INTRAVENOUS

## 2011-09-27 SURGICAL SUPPLY — 162 items
ADAPTER CARDIO PERF ANTE/RETRO (ADAPTER) ×6 IMPLANT
ADH SKN CLS APL DERMABOND .7 (GAUZE/BANDAGES/DRESSINGS) ×1
ADPR PRFSN 84XANTGRD RTRGD (ADAPTER) ×2
APPLICATOR COTTON TIP 6IN STRL (MISCELLANEOUS) IMPLANT
APPLIER CLIP 9.375 MED OPEN (MISCELLANEOUS)
APPLIER CLIP 9.375 SM OPEN (CLIP)
APR CLP MED 9.3 20 MLT OPN (MISCELLANEOUS)
APR CLP SM 9.3 20 MLT OPN (CLIP)
ATTRACTOMAT 16X20 MAGNETIC DRP (DRAPES) ×6 IMPLANT
BAG DECANTER FOR FLEXI CONT (MISCELLANEOUS) ×6 IMPLANT
BANDAGE ELASTIC 4 VELCRO ST LF (GAUZE/BANDAGES/DRESSINGS) ×3 IMPLANT
BANDAGE ELASTIC 6 VELCRO ST LF (GAUZE/BANDAGES/DRESSINGS) ×3 IMPLANT
BANDAGE GAUZE ELAST BULKY 4 IN (GAUZE/BANDAGES/DRESSINGS) ×3 IMPLANT
BASKET HEART  (ORDER IN 25'S) (MISCELLANEOUS) ×1
BASKET HEART (ORDER IN 25'S) (MISCELLANEOUS) ×1
BASKET HEART (ORDER IN 25S) (MISCELLANEOUS) ×1 IMPLANT
BLADE SAW STERNAL (BLADE) ×6 IMPLANT
BLADE SURG 12 STRL SS (BLADE) ×6 IMPLANT
BLADE SURG 15 STRL LF DISP TIS (BLADE) IMPLANT
BLADE SURG 15 STRL SS (BLADE) ×3
BLADE SURG ROTATE 9660 (MISCELLANEOUS) IMPLANT
BOOT SUTURE AID YELLOW STND (SUTURE) ×3 IMPLANT
CANISTER SUCTION 2500CC (MISCELLANEOUS) ×6 IMPLANT
CANNULA AORTIC ROOT 20012 (MISCELLANEOUS) ×3 IMPLANT
CANNULA GUNDRY RCSP 15FR (MISCELLANEOUS) ×6 IMPLANT
CATH CPB KIT VANTRIGT (MISCELLANEOUS) ×3 IMPLANT
CATH RETROPLEGIA CORONARY 14FR (CATHETERS) IMPLANT
CATH ROBINSON RED A/P 18FR (CATHETERS) ×15 IMPLANT
CATH THORACIC 28FR (CATHETERS) IMPLANT
CATH THORACIC 28FR RT ANG (CATHETERS) IMPLANT
CATH THORACIC 36FR (CATHETERS) IMPLANT
CATH THORACIC 36FR RT ANG (CATHETERS) ×9 IMPLANT
CLIP APPLIE 9.375 MED OPEN (MISCELLANEOUS) IMPLANT
CLIP APPLIE 9.375 SM OPEN (CLIP) IMPLANT
CLIP FOGARTY SPRING 6M (CLIP) ×2 IMPLANT
CLIP TI MEDIUM 24 (CLIP) IMPLANT
CLIP TI WIDE RED SMALL 24 (CLIP) ×9 IMPLANT
CLOTH BEACON ORANGE TIMEOUT ST (SAFETY) ×6 IMPLANT
CONN 1/2X1/2X1/2  BEN (MISCELLANEOUS) ×6
CONN 1/2X1/2X1/2 BEN (MISCELLANEOUS) ×1 IMPLANT
CONN 3/8X1/2 ST GISH (MISCELLANEOUS) ×6 IMPLANT
CONN Y 3/8X3/8X3/8  BEN (MISCELLANEOUS)
CONN Y 3/8X3/8X3/8 BEN (MISCELLANEOUS) IMPLANT
COVER SURGICAL LIGHT HANDLE (MISCELLANEOUS) ×12 IMPLANT
CRADLE DONUT ADULT HEAD (MISCELLANEOUS) ×6 IMPLANT
DERMABOND ADVANCED (GAUZE/BANDAGES/DRESSINGS) ×2
DERMABOND ADVANCED .7 DNX12 (GAUZE/BANDAGES/DRESSINGS) IMPLANT
DRAIN CHANNEL 32F RND 10.7 FF (WOUND CARE) ×3 IMPLANT
DRAPE CARDIOVASCULAR INCISE (DRAPES) ×6
DRAPE SLUSH MACHINE 52X66 (DRAPES) ×1 IMPLANT
DRAPE SLUSH/WARMER DISC (DRAPES) ×2 IMPLANT
DRAPE SRG 135X102X78XABS (DRAPES) ×2 IMPLANT
DRSG COVADERM 4X14 (GAUZE/BANDAGES/DRESSINGS) ×4 IMPLANT
ELECT BLADE 4.0 EZ CLEAN MEGAD (MISCELLANEOUS) ×3
ELECT BLADE 6.5 EXT (BLADE) ×6 IMPLANT
ELECT CAUTERY BLADE 6.4 (BLADE) ×6 IMPLANT
ELECT REM PT RETURN 9FT ADLT (ELECTROSURGICAL) ×12
ELECTRODE BLDE 4.0 EZ CLN MEGD (MISCELLANEOUS) ×1 IMPLANT
ELECTRODE REM PT RTRN 9FT ADLT (ELECTROSURGICAL) ×4 IMPLANT
GAUZE SPONGE 4X4 12PLY STRL LF (GAUZE/BANDAGES/DRESSINGS) ×2 IMPLANT
GLOVE BIO SURGEON STRL SZ 6 (GLOVE) ×16 IMPLANT
GLOVE BIO SURGEON STRL SZ 6.5 (GLOVE) ×5 IMPLANT
GLOVE BIO SURGEON STRL SZ7 (GLOVE) ×10 IMPLANT
GLOVE BIO SURGEON STRL SZ7.5 (GLOVE) ×6 IMPLANT
GLOVE BIO SURGEONS STRL SZ 6.5 (GLOVE) ×5
GLOVE BIOGEL PI IND STRL 6 (GLOVE) IMPLANT
GLOVE BIOGEL PI IND STRL 6.5 (GLOVE) IMPLANT
GLOVE BIOGEL PI IND STRL 7.0 (GLOVE) IMPLANT
GLOVE BIOGEL PI INDICATOR 6 (GLOVE)
GLOVE BIOGEL PI INDICATOR 6.5 (GLOVE)
GLOVE BIOGEL PI INDICATOR 7.0 (GLOVE)
GLOVE EUDERMIC 7 POWDERFREE (GLOVE) IMPLANT
GLOVE ORTHO TXT STRL SZ7.5 (GLOVE) IMPLANT
GOWN STRL NON-REIN LRG LVL3 (GOWN DISPOSABLE) ×24 IMPLANT
HEMOSTAT POWDER SURGIFOAM 1G (HEMOSTASIS) ×13 IMPLANT
HEMOSTAT SURGICEL 2X14 (HEMOSTASIS) ×5 IMPLANT
INSERT FOGARTY 61MM (MISCELLANEOUS) IMPLANT
INSERT FOGARTY XLG (MISCELLANEOUS) IMPLANT
KIT BASIN OR (CUSTOM PROCEDURE TRAY) ×6 IMPLANT
KIT PAIN CUSTOM (MISCELLANEOUS) IMPLANT
KIT ROOM TURNOVER OR (KITS) ×6 IMPLANT
KIT SUCTION CATH 14FR (SUCTIONS) ×6 IMPLANT
KIT VASOVIEW W/TROCAR VH 2000 (KITS) ×3 IMPLANT
LEAD PACING MYOCARDI (MISCELLANEOUS) ×3 IMPLANT
LIGACLIP MED LC202 (MISCELLANEOUS) ×3 IMPLANT
LINE VENT (MISCELLANEOUS) ×2 IMPLANT
MARKER GRAFT CORONARY BYPASS (MISCELLANEOUS) ×11 IMPLANT
NS IRRIG 1000ML POUR BTL (IV SOLUTION) ×30 IMPLANT
PACK OPEN HEART (CUSTOM PROCEDURE TRAY) ×6 IMPLANT
PAD ARMBOARD 7.5X6 YLW CONV (MISCELLANEOUS) ×12 IMPLANT
PENCIL BUTTON HOLSTER BLD 10FT (ELECTRODE) ×3 IMPLANT
PUNCH AORTIC ROTATE 4.0MM (MISCELLANEOUS) ×2 IMPLANT
PUNCH AORTIC ROTATE 4.5MM 8IN (MISCELLANEOUS) IMPLANT
PUNCH AORTIC ROTATE 5MM 8IN (MISCELLANEOUS) IMPLANT
SENSOR MYOCARDIAL TEMP (MISCELLANEOUS) ×2 IMPLANT
SET CARDIOPLEGIA MPS 5001102 (MISCELLANEOUS) ×2 IMPLANT
SOLUTION ANTI FOG 6CC (MISCELLANEOUS) IMPLANT
SPONGE GAUZE 4X4 12PLY (GAUZE/BANDAGES/DRESSINGS) ×12 IMPLANT
SPONGE INTESTINAL PEANUT (DISPOSABLE) IMPLANT
SPONGE LAP 18X18 X RAY DECT (DISPOSABLE) IMPLANT
SPONGE LAP 4X18 X RAY DECT (DISPOSABLE) IMPLANT
STOPCOCK 4 WAY LG BORE MALE ST (IV SETS) ×2 IMPLANT
SUCKER WEIGHTED FLEX (MISCELLANEOUS) ×3 IMPLANT
SUT BONE WAX W31G (SUTURE) ×6 IMPLANT
SUT ETHIBOND 2 0 SH (SUTURE) ×15 IMPLANT
SUT ETHIBOND 2 0 SH 36X2 (SUTURE) ×5 IMPLANT
SUT ETHIBOND 2 0 V4 (SUTURE) IMPLANT
SUT ETHIBOND 2 0V4 GREEN (SUTURE) IMPLANT
SUT ETHIBOND 4 0 TF (SUTURE) IMPLANT
SUT ETHIBOND 5 0 C 1 30 (SUTURE) IMPLANT
SUT MNCRL AB 4-0 PS2 18 (SUTURE) ×2 IMPLANT
SUT PROLENE 3 0 RB 1 (SUTURE) ×3 IMPLANT
SUT PROLENE 3 0 SH 1 (SUTURE) ×3 IMPLANT
SUT PROLENE 3 0 SH DA (SUTURE) ×3 IMPLANT
SUT PROLENE 3 0 SH1 36 (SUTURE) ×4 IMPLANT
SUT PROLENE 4 0 RB 1 (SUTURE) ×9
SUT PROLENE 4 0 SH DA (SUTURE) ×6 IMPLANT
SUT PROLENE 4-0 RB1 .5 CRCL 36 (SUTURE) ×3 IMPLANT
SUT PROLENE 5 0 C 1 36 (SUTURE) ×8 IMPLANT
SUT PROLENE 6 0 C 1 30 (SUTURE) ×7 IMPLANT
SUT PROLENE 6 0 CC (SUTURE) IMPLANT
SUT PROLENE 7 0 BV 1 (SUTURE) IMPLANT
SUT PROLENE 7 0 BV1 MDA (SUTURE) IMPLANT
SUT PROLENE 7 0 DA (SUTURE) ×3 IMPLANT
SUT PROLENE 7.0 RB 3 (SUTURE) ×9 IMPLANT
SUT PROLENE 8 0 BV175 6 (SUTURE) ×3 IMPLANT
SUT PROLENE BLUE 7 0 (SUTURE) ×8 IMPLANT
SUT PROLENE POLY MONO (SUTURE) IMPLANT
SUT SILK  1 MH (SUTURE) ×4
SUT SILK 1 MH (SUTURE) ×2 IMPLANT
SUT SILK 1 TIES 10X30 (SUTURE) ×3 IMPLANT
SUT SILK 2 0 (SUTURE)
SUT SILK 2 0 SH CR/8 (SUTURE) ×6 IMPLANT
SUT SILK 2-0 18XBRD TIE 12 (SUTURE) IMPLANT
SUT SILK 3 0 SH CR/8 (SUTURE) ×3 IMPLANT
SUT SILK 4 0 (SUTURE)
SUT SILK 4-0 18XBRD TIE 12 (SUTURE) IMPLANT
SUT STEEL 6MS V (SUTURE) ×6 IMPLANT
SUT STEEL STERNAL CCS#1 18IN (SUTURE) ×3 IMPLANT
SUT STEEL SZ 6 DBL 3X14 BALL (SUTURE) ×6 IMPLANT
SUT TEM PAC WIRE 2 0 SH (SUTURE) ×6 IMPLANT
SUT VIC AB 1 CTX 18 (SUTURE) ×6 IMPLANT
SUT VIC AB 1 CTX 36 (SUTURE) ×9
SUT VIC AB 1 CTX36XBRD ANBCTR (SUTURE) ×3 IMPLANT
SUT VIC AB 2-0 CT1 27 (SUTURE) ×6
SUT VIC AB 2-0 CT1 TAPERPNT 27 (SUTURE) ×1 IMPLANT
SUT VIC AB 2-0 CTX 27 (SUTURE) IMPLANT
SUT VIC AB 3-0 SH 27 (SUTURE)
SUT VIC AB 3-0 SH 27X BRD (SUTURE) IMPLANT
SUT VIC AB 3-0 X1 27 (SUTURE) IMPLANT
SUT VICRYL 4-0 PS2 18IN ABS (SUTURE) IMPLANT
SUTURE E-PAK OPEN HEART (SUTURE) ×6 IMPLANT
SYSTEM SAHARA CHEST DRAIN ATS (WOUND CARE) ×6 IMPLANT
TAPE CLOTH SURG 4X10 WHT LF (GAUZE/BANDAGES/DRESSINGS) ×2 IMPLANT
TOWEL OR 17X24 6PK STRL BLUE (TOWEL DISPOSABLE) ×9 IMPLANT
TOWEL OR 17X26 10 PK STRL BLUE (TOWEL DISPOSABLE) ×9 IMPLANT
TRAY CATH LUMEN 1 20CM STRL (SET/KITS/TRAYS/PACK) ×2 IMPLANT
TRAY FOLEY IC TEMP SENS 14FR (CATHETERS) ×3 IMPLANT
TRAY FOLEY IC TEMP SENS 16FR (CATHETERS) ×3 IMPLANT
TUBING INSUFFLATION 10FT LAP (TUBING) ×3 IMPLANT
UNDERPAD 30X30 INCONTINENT (UNDERPADS AND DIAPERS) ×6 IMPLANT
WATER STERILE IRR 1000ML POUR (IV SOLUTION) ×12 IMPLANT

## 2011-09-27 NOTE — Progress Notes (Signed)
RT NOTE: Wean failed due to ABG results ,7.30/48/153/24.Pt awake and alert but respirations very shallow,VT 100-200's.Placed back on full support.

## 2011-09-27 NOTE — Addendum Note (Signed)
Addendum  created 09/27/11 2055 by Donnella Bi, RN   Modules edited:Anesthesia Events

## 2011-09-27 NOTE — Anesthesia Postprocedure Evaluation (Signed)
  Anesthesia Post-op Note  Patient: Bridget Martinez  Procedure(s) Performed:  CORONARY ARTERY BYPASS GRAFTING (CABG) - times four, on pump, using left internal mammary artery and right greater saphenous vein with coronary endarterectomy.  Patient Location: SICU  Anesthesia Type: General  Level of Consciousness: sedated and unresponsive  Airway and Oxygen Therapy: Patient remains intubated per anesthesia plan and Patient placed on Ventilator (see vital sign flow sheet for setting)  Post-op Pain: none  Post-op Assessment: Post-op Vital signs reviewed, Patient's Cardiovascular Status Stable, Respiratory Function Stable, Patent Airway, No signs of Nausea or vomiting and Pain level controlled  Post-op Vital Signs: stable  Complications: No apparent anesthesia complications

## 2011-09-27 NOTE — Addendum Note (Signed)
Addendum  created 09/27/11 1942 by Veatrice Bourbon, MD   Modules edited:Anesthesia Events

## 2011-09-27 NOTE — OR Nursing (Signed)
Family called at [facility] station at beginning of procedure.

## 2011-09-27 NOTE — Progress Notes (Signed)
Pt took carvedilol 6.25mg @0330 . Held metoprolol preoperatively.//l. Siena Poehler,rn

## 2011-09-27 NOTE — OR Nursing (Signed)
1st call to 2300; volunteer desk called, off pump.

## 2011-09-27 NOTE — Anesthesia Preprocedure Evaluation (Addendum)
Anesthesia Evaluation  Patient identified by MRN, date of birth, ID band Patient awake    Reviewed: Allergy & Precautions, H&P , NPO status , Patient's Chart, lab work & pertinent test results  History of Anesthesia Complications (+) AWARENESS UNDER ANESTHESIA  Airway Mallampati: I TM Distance: >3 FB Neck ROM: full    Dental   Pulmonary neg pulmonary ROS,          Cardiovascular hypertension, + angina + CAD and + Past MI + Valvular Problems/Murmurs regular Normal    Neuro/Psych Negative Neurological ROS     GI/Hepatic GERD-  ,  Endo/Other  Diabetes mellitus-  Renal/GU negative Renal ROS  Genitourinary negative   Musculoskeletal   Abdominal   Peds  Hematology negative hematology ROS (+)   Anesthesia Other Findings   Reproductive/Obstetrics                          Anesthesia Physical Anesthesia Plan  ASA: III  Anesthesia Plan: General ETT   Post-op Pain Management:    Induction: Intravenous  Airway Management Planned: Oral ETT  Additional Equipment: Arterial line, CVP and PA Cath  Intra-op Plan:   Post-operative Plan: Post-operative intubation/ventilation  Informed Consent: I have reviewed the patients History and Physical, chart, labs and discussed the procedure including the risks, benefits and alternatives for the proposed anesthesia with the patient or authorized representative who has indicated his/her understanding and acceptance.     Plan Discussed with: Anesthesiologist, CRNA and Surgeon  Anesthesia Plan Comments:         Anesthesia Quick Evaluation

## 2011-09-27 NOTE — Brief Op Note (Addendum)
09/27/2011  12:20 PM  PATIENT:  Bridget Martinez  61 y.o. female  PRE-OPERATIVE DIAGNOSIS:  1.Multivessel CAD (s/p NSTEMI)                                                       2.Mitral regurgitation  POST-OPERATIVE DIAGNOSIS:1.Multivessel CAD (s/p NSTEMI)                                                       2.Mitral regurgitation (mild)   PROCEDURE:   CORONARY ARTERY BYPASS GRAFTING (CABG)x4 (LIMA to LAD, SVG to Diagonal,SVG to Circumflex, and SVG to RCA)  Coronary endarerectomy  of circumflex.                EVH from right thigh and partial right calf;right femoral A line.  SURGEON:   Len Childs, MD  PHYSICIAN ASSISTANT: Lars Pinks PA-C  ASSISTANTS: Dineen Kid RNFA  ANESTHESIA:   general  EBL:  Total I/O In: -  Out: 350 [Urine:350]   DRAINS:  Mediastinal and Plerual chest tubes    COUNTS CORRECT: YES  DICTATION: .Dragon Dictation  PLAN OF CARE: Admit to inpatient   PATIENT DISPOSITION:  ICU - intubated and hemodynamically stable.   Delay start of Pharmacological VTE agent (>24hrs) due to surgical blood loss or risk of bleeding:  YES  PRE OP WEIGHT: 72 kg

## 2011-09-27 NOTE — Assessment & Plan Note (Signed)
.   Patient seen and examined with Darrick Grinder, NP. We discussed all aspects of the encounter. I agree with the assessment and plan as stated above.   She has 3-v CAD. Has improvement with Dr. Prescott Gum to discuss CABG now that HF is improved. Continue medical therapy for now.

## 2011-09-27 NOTE — Progress Notes (Signed)
The patient was examined and preop studies reviewed. There has been no change from the prior exam and the patient is ready for surgery.Plan CABG and mitral repair this am

## 2011-09-27 NOTE — Progress Notes (Signed)
Patient ID: Bridget Martinez, female   DOB: [birth year], 61 y.o.   MRN: [id number]  Patient examined and record reviewed.Hemodynamics stable,labs satisfactory.Patient stable but not alert enough for extubation  Will start plavix postop due to endarterectomy of OM  Graft.   VAN TRIGT III,Bridget Martinez 09/27/2011

## 2011-09-27 NOTE — Progress Notes (Signed)
Pt stated that she took aspirin this am.  OR notified.//l. Javaun Dimperio,rn

## 2011-09-27 NOTE — Transfer of Care (Signed)
Immediate Anesthesia Transfer of Care Note  Patient: Bridget Martinez  Procedure(s) Performed:  CORONARY ARTERY BYPASS GRAFTING (CABG) - times four, on pump, using left internal mammary artery and right greater saphenous vein with coronary endarterectomy.  Patient Location: PACU and SICU  Anesthesia Type: General  Level of Consciousness: sedated  Airway & Oxygen Therapy: Patient remains intubated per anesthesia plan and Patient placed on Ventilator (see vital sign flow sheet for setting)  Post-op Assessment: Report given to PACU RN and Post -op Vital signs reviewed and stable  Post vital signs: Reviewed and stable Filed Vitals:   09/27/11 0544  BP: 118/70  Pulse: 76  Temp: 36.4 C  Resp: 18    Complications: No apparent anesthesia complications

## 2011-09-27 NOTE — Preoperative (Signed)
Beta Blockers   Reason not to administer Beta Blockers:Not Applicable 

## 2011-09-27 NOTE — Anesthesia Procedure Notes (Signed)
Procedures                         TEE probe placed oral/esophageal w/o difficulty per request Prescott Gum MD.   Monitor only.  GES

## 2011-09-28 ENCOUNTER — Encounter (HOSPITAL_COMMUNITY): Payer: Self-pay | Admitting: *Deleted

## 2011-09-28 ENCOUNTER — Inpatient Hospital Stay (HOSPITAL_COMMUNITY): Payer: BC Managed Care – PPO

## 2011-09-28 LAB — POCT I-STAT, CHEM 8
BUN: 5 mg/dL — ABNORMAL LOW (ref 6–23)
Calcium, Ion: 1.28 mmol/L (ref 1.12–1.32)
Chloride: 102 mEq/L (ref 96–112)
Creatinine, Ser: 0.8 mg/dL (ref 0.50–1.10)
Glucose, Bld: 131 mg/dL — ABNORMAL HIGH (ref 70–99)
HCT: 31 % — ABNORMAL LOW (ref 36.0–46.0)
Hemoglobin: 10.5 g/dL — ABNORMAL LOW (ref 12.0–15.0)
Potassium: 3.8 mEq/L (ref 3.5–5.1)
Sodium: 140 mEq/L (ref 135–145)
TCO2: 25 mmol/L (ref 0–100)

## 2011-09-28 LAB — CBC
HCT: 29.9 % — ABNORMAL LOW (ref 36.0–46.0)
Hemoglobin: 9.5 g/dL — ABNORMAL LOW (ref 12.0–15.0)
MCH: 29.7 pg (ref 26.0–34.0)
MCH: 29 pg (ref 26.0–34.0)
MCHC: 34.1 g/dL (ref 30.0–36.0)
MCHC: 33.1 g/dL (ref 30.0–36.0)
MCV: 87.7 fL (ref 78.0–100.0)
RDW: 14.9 % (ref 11.5–15.5)
RDW: 15.4 % (ref 11.5–15.5)

## 2011-09-28 LAB — POCT I-STAT 3, ART BLOOD GAS (G3+)
Acid-base deficit: 3 mmol/L — ABNORMAL HIGH (ref 0.0–2.0)
Acid-base deficit: 3 mmol/L — ABNORMAL HIGH (ref 0.0–2.0)
Bicarbonate: 23.3 mEq/L (ref 20.0–24.0)
Bicarbonate: 21.7 mEq/L (ref 20.0–24.0)
O2 Saturation: 100 %
O2 Saturation: 99 %
Patient temperature: 36.8
Patient temperature: 37.4
TCO2: 25 mmol/L (ref 0–100)
TCO2: 23 mmol/L (ref 0–100)
pCO2 arterial: 43.4 mmHg (ref 35.0–45.0)
pCO2 arterial: 38.4 mmHg (ref 35.0–45.0)
pH, Arterial: 7.337 — ABNORMAL LOW (ref 7.350–7.400)
pH, Arterial: 7.362 (ref 7.350–7.400)
pO2, Arterial: 179 mmHg — ABNORMAL HIGH (ref 80.0–100.0)
pO2, Arterial: 162 mmHg — ABNORMAL HIGH (ref 80.0–100.0)

## 2011-09-28 LAB — BASIC METABOLIC PANEL
Calcium: 8.5 mg/dL (ref 8.4–10.5)
GFR calc non Af Amer: >90 mL/min (ref >90)
Glucose, Bld: 172 mg/dL — ABNORMAL HIGH (ref 70–99)
Sodium: 138 mEq/L (ref 135–145)

## 2011-09-28 LAB — GLUCOSE, CAPILLARY
Glucose-Capillary: 191 mg/dL — ABNORMAL HIGH (ref 70–99)
Glucose-Capillary: 229 mg/dL — ABNORMAL HIGH (ref 70–99)
Glucose-Capillary: 252 mg/dL — ABNORMAL HIGH (ref 70–99)
Glucose-Capillary: 223 mg/dL — ABNORMAL HIGH (ref 70–99)
Glucose-Capillary: 137 mg/dL — ABNORMAL HIGH (ref 70–99)
Glucose-Capillary: 89 mg/dL (ref 70–99)

## 2011-09-28 LAB — MAGNESIUM: Magnesium: 2.2 mg/dL (ref 1.5–2.5)

## 2011-09-28 MED ORDER — INSULIN ASPART 100 UNIT/ML ~~LOC~~ SOLN: 0.0000 [IU] | SUBCUTANEOUS | Status: DC

## 2011-09-28 MED ORDER — INSULIN GLARGINE 100 UNIT/ML ~~LOC~~ SOLN
24.0000 [IU] | Freq: Two times a day (BID) | SUBCUTANEOUS | Status: DC
  Administered 2011-09-28: 24 [IU] via SUBCUTANEOUS
  Filled 2011-09-28: qty 3

## 2011-09-28 MED ORDER — INSULIN GLARGINE 100 UNIT/ML ~~LOC~~ SOLN: 24.0000 [IU] | SUBCUTANEOUS | Status: DC

## 2011-09-28 MED ORDER — METHYLPREDNISOLONE SODIUM SUCC 125 MG IJ SOLR
80.0000 mg | Freq: Once | INTRAMUSCULAR | Status: AC
  Administered 2011-09-28: 80 mg via INTRAVENOUS
  Filled 2011-09-28: qty 1.28

## 2011-09-28 NOTE — Progress Notes (Signed)
Patient ID: Bridget Martinez, female   DOB: 12/05/1950, 61 y.o.   MRN: [id number] TCTS DAILY PROGRESS NOTE                   [address]            [place] [address]          [phone]      1 Day Post-Op Procedure(s) (LRB): CORONARY ARTERY BYPASS GRAFTING (CABG) (N/A)  Total Length of Stay:  LOS: 1 day   Subjective: Still on vent, awake and alert follows commands  Objective: Vital signs in last 24 hours: Patient Vitals for the past 24 hrs: Temp:  [94.1 F (34.5 C)-99 F (37.2 C)] 99 F (37.2 C) (01/19 1000) Pulse Rate:  [77-106] 88  (01/19 1000) Cardiac Rhythm:  [-] Atrial paced (01/19 0400) Resp:  [0-20] 11  (01/19 1000) BP: (80-131)/(36-59) 106/37 mmHg (01/19 0936) SpO2:  [99 %-100 %] 100 % (01/19 1000) Arterial Line BP: (86-149)/(34-55) 135/44 mmHg (01/19 1000) FiO2 (%):  [40 %-50 %] 40 % (01/19 0937) Weight:  [157 lb 13.6 oz (71.6 kg)-171 lb 8.3 oz (77.8 kg)] 171 lb 8.3 oz (77.8 kg) (01/19 0500)  Not paced now  Wt Readings from Last 3 Encounters:  09/28/11 171 lb 8.3 oz (77.8 kg)  09/28/11 171 lb 8.3 oz (77.8 kg)  09/25/11 158 lb (71.668 kg)   Weight change:    Hemodynamic parameters for last 24 hours: PAP: (16-34)/(7-22) 27/10 mmHg CO:  [3.5 L/min-4.7 L/min] 3.8 L/min CI:  [2 L/min/m2-2.7 L/min/m2] 2.2 L/min/m2  Intake/Output from previous day: 01/18 0701 - 01/19 0700 In: 6996.7 [I.V.:5190.7; Blood:626; NG/GT:100; IV Piggyback:1080] Out: [id number] [Urine:6010; Emesis/NG output:150; Blood:1885; Chest Tube:410]  Intake/Output this shift:    Current Meds: Scheduled Meds:   . acetaminophen (TYLENOL) oral liquid 160 mg/5 mL  650 mg Per Tube NOW   Or  . acetaminophen  650 mg Rectal NOW  . acetaminophen  1,000 mg Oral Q6H   Or  . acetaminophen (TYLENOL) oral liquid 160 mg/5 mL  975 mg Per Tube Q6H  . aspirin  81 mg Per Tube Daily   Or  . aspirin EC  325 mg Oral Daily  . bisacodyl  10 mg Oral Daily   Or  . bisacodyl  10 mg Rectal Daily    . cefUROXime (ZINACEF)  IV  1.5 g Intravenous Q12H  . clopidogrel  75 mg Oral Q breakfast  . docusate sodium  200 mg Oral Daily  . escitalopram  10 mg Oral Daily  . famotidine (PEPCID) IV  20 mg Intravenous Q12H  . insulin aspart  0-24 Units Subcutaneous Q2H  . insulin aspart  0-24 Units Subcutaneous Q4H  . insulin glargine  24 Units Subcutaneous BID  . levothyroxine  100 mcg Oral QAC breakfast  . magnesium sulfate  4 g Intravenous Once  . methylPREDNISolone (SOLU-MEDROL) injection  80 mg Intravenous Once  . metoprolol tartrate  12.5 mg Oral BID   Or  . metoprolol tartrate  12.5 mg Per Tube BID  . pantoprazole  40 mg Oral Q1200  . potassium chloride  10 mEq Intravenous Q1 Hr x 3  . potassium chloride  10 mEq Intravenous Q1 Hr x 3  . rosuvastatin  40 mg Oral q1800  . sodium chloride  3 mL Intravenous Q12H  . vancomycin (VANCOCIN) IVPB 1000 mg/100 mL central line  1,000 mg Intravenous Once  . DISCONTD: aminocaproic acid (AMICAR) for OHS  Intravenous To OR  . DISCONTD: aminocaproic acid (AMICAR) IVPB  5 g Intravenous To OR  . DISCONTD: aspirin  324 mg Per Tube Daily  . DISCONTD: aspirin EC  325 mg Oral Daily  . DISCONTD: cefUROXime (ZINACEF)  IV  750 mg Intravenous To OR  . DISCONTD: chlorhexidine  30 mL Topical UD  . DISCONTD: epinephrine  0.5-20 mcg/min Intravenous To OR  . DISCONTD: insulin (NOVOLIN-R) infusion   Intravenous To OR  . DISCONTD: insulin regular  0-10 Units Intravenous TID WC  . DISCONTD: magnesium sulfate  40 mEq Other To OR  . DISCONTD: metoprolol tartrate  12.5 mg Oral Once  . DISCONTD: milrinone  0.125 mcg/kg/min Intravenous To OR  . DISCONTD: nitroGLYCERIN  2-200 mcg/min Intravenous To OR  . DISCONTD: nitroglycerin/verapamil/heparin solution irrigation for artery spasm   Irrigation To OR  . DISCONTD: phenylephrine (NEO-SYNEPHRINE) Adult infusion  30-200 mcg/min Intravenous To OR  . DISCONTD: potassium chloride  10 mEq Intravenous 1 day or 1 dose  . DISCONTD:  potassium chloride  10 mEq Intravenous Q1 Hr x 3  . DISCONTD: potassium chloride  80 mEq Other To OR  . DISCONTD: verapamil  10 mg Intravenous To OR   Continuous Infusions:   . sodium chloride 20 mL/hr at 09/28/11 0700  . sodium chloride 20 mL/hr at 09/27/11 1500  . sodium chloride    . dexmedetomidine (PRECEDEX) IV infusion Stopped (09/27/11 1845)  . DOPamine 3 mcg/kg/min (09/28/11 0700)  . lactated ringers 20 mL/hr (09/27/11 1921)  . milrinone 0.3 mcg/kg/min (09/28/11 0700)  . nitroGLYCERIN    . phenylephrine (NEO-SYNEPHRINE) Adult infusion 25 mcg/min (09/28/11 0700)  . DISCONTD: insulin (NOVOLIN-R) infusion 1.2 Units/hr (09/27/11 2000)   PRN Meds:.albumin human, lactated ringers, metoprolol, midazolam, morphine injection, morphine, ondansetron (ZOFRAN) IV, oxyCODONE, sodium chloride, DISCONTD: hemostatic agents, DISCONTD: hemostatic agents, DISCONTD: hemostatic agents, DISCONTD: sodium chloride irrigation, DISCONTD: thrombin, DISCONTD: verapamil  General appearance: alert, cooperative and no distress Neurologic: intact Heart: regular rate and rhythm, S1, S2 normal, no murmur, click, rub or gallop Lungs: clear to auscultation bilaterally Abdomen: soft, non-tender; bowel sounds normal; no masses,  no organomegaly Extremities: extremities normal, atraumatic, no cyanosis or edema  Lab Results: CBC: Basename 09/28/11 0415 09/27/11 2023 09/27/11 1430  WBC 11.0* -- 13.6*  HGB 9.5* 10.5* --  HCT 27.9* 31.0* --  PLT 168 -- 175   BMET:  Basename 09/28/11 0415 09/27/11 2023  NA 138 138  K 3.9 3.6  CL 103 103  CO2 23 --  GLUCOSE 172* 100*  BUN 7 6  CREATININE 0.73 0.80  CALCIUM 8.5 --    PT/INR:  Basename 09/27/11 1430  LABPROT 17.3*  INR 1.39   Radiology: Dg Chest Portable 1 View In Am  09/28/2011  *RADIOLOGY REPORT*  Clinical Data: CABG.  PORTABLE CHEST - 1 VIEW  Comparison: 09/27/2011  Findings: Support devices are unchanged.  Changes of CABG.  No pneumothorax.   Cardiomegaly.  No confluent opacities or effusions.  IMPRESSION: Stable postoperative changes.  No pneumothorax.  Original Report Authenticated By: Raelyn Number, M.D.   Dg Chest Portable 1 View  09/27/2011  *RADIOLOGY REPORT*  Clinical Data: Post open heart surgery  PORTABLE CHEST - 1 VIEW  Comparison: Chest x-ray of 09/24/2011  Findings: The tip of the endotracheal tube is approximately 3.5 cm above the carina.  A left chest tube is present and no pneumothorax is seen.  Mild basilar atelectasis is present.  A Swan-Ganz catheter is noted with the  tip in the main pulmonary artery.  Mild cardiomegaly is stable.  IMPRESSION:  1.  Endotracheal tube tip approximately 3.5 cm above carina. 2.  Left chest tube.  No pneumothorax. 3.  Bibasilar linear atelectasis  Original Report Authenticated By: Joretta Bachelor, M.D.     Assessment/Plan: S/P Procedure(s) (LRB): CORONARY ARTERY BYPASS GRAFTING (CABG) (N/A) Diuresis Diabetes control See progression orders Extubated after dose solumedrol    Grace Isaac MD  Beeper [phone] Office [phone] 09/28/2011 10:38 AM

## 2011-09-28 NOTE — Progress Notes (Signed)
RT NOTE:Attempted wean,pt did better her TV's were 200-300's, NIF -44/ FVC 700 cc, ABG's 7.33/43/179/23,but pt had no leak when cuff was deflated.RN made aware,pt placed back on full support.

## 2011-09-28 NOTE — Plan of Care (Signed)
Problem: Phase I Progression Outcomes Goal: Patient status is OR emergent or Short Stay Outcome: Completed/Met Date Met:  09/28/11 Same day admit  Problem: Phase II Progression Outcomes Goal: Tolerates weaning with O2 Sat > 90 Outcome: Progressing Passed all pre-extubation parameters except ETT leak test. Dr Prescott Gum to be notified

## 2011-09-28 NOTE — Procedures (Signed)
Extubation Procedure Note  Patient Details:   Name: Bridget Martinez DOB: 07/30/1951 MRN: [id number]   Airway Documentation:    Evaluation  O2 sats: stable throughout Complications: No apparent complications Patient did tolerate procedure well. Bilateral Breath Sounds: Clear;Diminished Suctioning: Airway Yes  Malachi Paradise 09/28/2011, 1:15 PM Pt extubated @ 11:55 am. NIF 40cm, VC 1100cc's, Passed cuff leak. Pt now wearing 4lpm [place].

## 2011-09-28 NOTE — Progress Notes (Signed)
PT Cancellation Note  Evaluation cancelled today due to medical issues with patient which prohibited therapy: Patient remains intubated and has failed two weaning attempts thus far. We will check back at later date to advance mobility.  Jake Bathe, PT  Pager [phone]  09/28/2011, 10:09 AM

## 2011-09-28 NOTE — Op Note (Signed)
NAME:  CHRISTINEA, CLINEBELL NO.:  [id number]  MEDICAL RECORD NO.:  [id number]  LOCATION:  2312                         FACILITY:  [facility]  PHYSICIAN:  Ivin Poot, M.D.  DATE OF BIRTH:  [birth year]  DATE OF PROCEDURE:  09/27/2011 DATE OF DISCHARGE:                              OPERATIVE REPORT   OPERATION: 1. Coronary artery bypass grafting x4 (left internal mammary artery to     left anterior descending, saphenous vein graft to diagonal,     saphenous vein graft to obtuse marginal, saphenous vein graft to     right coronary artery). 2. Coronary endarterectomy of the circumflex obtuse marginal-1. 3. Placement of right femoral arterial line for blood pressure     monitoring. 4. Endoscopic harvest of right leg greater saphenous vein.  SURGEON:  Ivin Poot, MD  ASSISTANT:  Lars Pinks, PA-C  ANESTHESIA:  General by Dr. Kate Sable.  INDICATIONS:  The patient is a 61 year old Caucasian female, diabetic, who was admitted in the fall of 2012 with severe congestive heart failure, low EF, and was found to have severe 3 vessel disease.  She was placed on medical therapy and her heart failure significantly improved under the direction Dr. Glori Bickers.  Follow up in the [facility] in late December demonstrated her EF to be somewhat improved to 30%, and her ischemic MR also to be improved from moderate to mild.  Her venous stasis ulcers and peripheral edema had improved and she was felt to be candidate for surgical coronary revascularization.  I examined the patient in the office and reviewed the operation in detail with the patient and her husband.  She understood the indications, risks, benefits, expected recovery, and alternatives.  She understood the risks of stroke, bleeding, blood transfusion requirement, infection, and death.  She agreed to proceed with the surgery under what I felt was an informed consent.  FINDINGS: 1. Severe  diffuse coronary artery disease with diabetic type pattern.     The circumflex vessel required endarterectomy to perform the     anastomosis. 2. Adequate quality vein, small, but adequate quality left IMA. 3. No blood transfusion was required. 4. Significant improvement in global LV function following separation     from cardiopulmonary bypass, without significant mitral     regurgitation as well.  PROCEDURE:  The patient was brought to the operative room and placed supine on the operating room table.  General anesthesia was induced.  A proper time-out was performed.  The patient was prepped and draped as a sterile field.  A transesophageal 2-D echo was placed by the anesthesiologist.  This confirmed the preoperative diagnosis of ischemic cardiomyopathy.  There was mild MR.  It was decided not to perform mitral valve repair.  A sternal incision was made as the saphenous vein was harvested endoscopically from the right leg.  The left internal mammary artery was harvested as a pedicle graft.  It was a small 1.2 mm vessel but had good flow.  The pericardium was opened and suspended. Pursestrings were placed in the ascending aorta and right atrium.  After the vein had been harvested, the patient was heparinized and the patient was cannulated  and placed on cardiopulmonary bypass.  The ACT had been documented as being therapeutic.  The coronaries were identified for grafting.  There were severely diseased.  The mammary artery and vein grafts were prepared for the distal anastomoses and cardioplegia cannulas were placed.  The patient was cooled to 32 degrees and aortic cross-clamp was applied.  A 800 mL of cold blood cardioplegia was delivered in split doses between the antegrade aortic and retrograde coronary sinus catheters.  There was good cardioplegic arrest and septal temperature dropped less than 12 degrees.  Cardioplegia was delivered every 20 minutes or less.  The distal coronary  anastomoses were then performed.  The first distal anastomosis was the distal RCA.  This was totally occluded proximally. A reverse saphenous vein was sewn end-to-side with running 7-0 Prolene to this 1.5 mm vessel with good flow.  The second distal anastomosis was to the circumflex marginal.  This was a 1.5 mm vessel.  However, it had a significant thickened calcified wall its entire length.  An endarterectomy was performed in the mid vessel.  A good endarterectomized cast of a plaque was removed proximally and distally. The vein was sewn end-to-side with running 7-0 Prolene.  There was good flow through the graft.  Cardioplegia was redosed.  The third distal anastomosis was to the diagonal branch of the LAD.  This had a proximal 90% stenosis.  Reverse saphenous vein was sewn end-to-side with running 7 Prolene with adequate flow.  The fourth distal anastomosis was to the mid LAD.  This was a 1.5 mm vessel.  The left IMA pedicle was brought through an opening in the left lateral pericardium, was brought down onto the LAD and sewn end-to-side with running 8-0 Prolene.  There was good flow through the anastomosis.  The bulldog was reapplied and the pedicle secured to epicardium.  Cardioplegia was redosed.  While the cross-clamp was in place, 3 proximal vein anastomoses were performed on the ascending aorta using a 4.0 mm punch running 7-0 Prolene.  The air was vented from the coronaries with a dose of retrograde warm blood cardioplegia and the cross-clamp was removed.  The heart was cardioverted back to a regular rhythm.  The grafts were de- aired.  The grafts were checked and each was hemostatic with good flow. Temporary pacing wires were applied.  Low-dose dopamine and milrinone were started.  The patient was separated from bypass without difficulty. The echo showed improved global function.  It should be noted that the patient's ventricle had an old scar with thinning at the apex  and inferior walls.  Protamine was administered without adverse reaction.  The patient remained hemodynamically stable.  The cannulas were removed.  The mediastinum was irrigated.  The superior pericardial fat was closed over the aorta.  The anterior mediastinal and a left pleural chest tube were placed and brought out through separate incisions.  The sternum was closed with interrupted steel wire.  The pectoralis fascia and subcutaneous layers and skin were closed with running Vicryl.  Total cardiopulmonary bypass time was 140 minutes.  It should be noted a femoral A-line was placed in right femoral artery for blood pressure monitoring as the radial A-line was somewhat dampened.     Ivin Poot, M.D.    PV/MEDQ  D:  09/27/2011  T:  09/28/2011  Job:  [id number]  cc:   Shaune Pascal. [name], MD

## 2011-09-29 ENCOUNTER — Inpatient Hospital Stay (HOSPITAL_COMMUNITY): Payer: BC Managed Care – PPO

## 2011-09-29 LAB — CBC
HCT: 27.6 % — ABNORMAL LOW (ref 36.0–46.0)
Hemoglobin: 9.3 g/dL — ABNORMAL LOW (ref 12.0–15.0)
MCH: 29.6 pg (ref 26.0–34.0)
MCHC: 33.7 g/dL (ref 30.0–36.0)
MCV: 87.9 fL (ref 78.0–100.0)
Platelets: 164 10*3/uL (ref 150–400)
RBC: 3.14 MIL/uL — ABNORMAL LOW (ref 3.87–5.11)
RDW: 15.6 % — ABNORMAL HIGH (ref 11.5–15.5)
WBC: 14.5 10*3/uL — ABNORMAL HIGH (ref 4.0–10.5)

## 2011-09-29 LAB — GLUCOSE, CAPILLARY
Glucose-Capillary: 100 mg/dL — ABNORMAL HIGH (ref 70–99)
Glucose-Capillary: 102 mg/dL — ABNORMAL HIGH (ref 70–99)
Glucose-Capillary: 124 mg/dL — ABNORMAL HIGH (ref 70–99)
Glucose-Capillary: 131 mg/dL — ABNORMAL HIGH (ref 70–99)
Glucose-Capillary: 277 mg/dL — ABNORMAL HIGH (ref 70–99)
Glucose-Capillary: 55 mg/dL — ABNORMAL LOW (ref 70–99)
Glucose-Capillary: 62 mg/dL — ABNORMAL LOW (ref 70–99)
Glucose-Capillary: 94 mg/dL (ref 70–99)

## 2011-09-29 LAB — BASIC METABOLIC PANEL
BUN: 8 mg/dL (ref 6–23)
CO2: 25 mEq/L (ref 19–32)
Calcium: 9.1 mg/dL (ref 8.4–10.5)
Chloride: 103 mEq/L (ref 96–112)
Creatinine, Ser: 0.75 mg/dL (ref 0.50–1.10)
GFR calc Af Amer: >90 mL/min (ref >90)
GFR calc non Af Amer: 90 mL/min — ABNORMAL LOW (ref >90)
Glucose, Bld: 103 mg/dL — ABNORMAL HIGH (ref 70–99)
Potassium: 3.7 mEq/L (ref 3.5–5.1)
Sodium: 136 mEq/L (ref 135–145)

## 2011-09-29 MED ORDER — FUROSEMIDE 10 MG/ML IJ SOLN
40.0000 mg | Freq: Two times a day (BID) | INTRAMUSCULAR | Status: DC
  Administered 2011-09-29 (×2): 40 mg via INTRAVENOUS
  Filled 2011-09-29 (×5): qty 4

## 2011-09-29 MED ORDER — INSULIN GLARGINE 100 UNIT/ML ~~LOC~~ SOLN
24.0000 [IU] | Freq: Every day | SUBCUTANEOUS | Status: DC
  Administered 2011-09-29: 24 [IU] via SUBCUTANEOUS

## 2011-09-29 NOTE — Progress Notes (Signed)
Physical Therapy Patient Details Name: Bridget Martinez MRN: [id number] DOB: [birth year] Today's Date: 09/29/2011  Per RN and pt, pt is moving well and has already ambulated 300' with very minimal A per RN.  Both feel no need for PT at this time.  Will sign off and if needs arise please re-order.    Catarina Hartshorn, [place] 09/29/2011, 11:53 AM

## 2011-09-29 NOTE — Progress Notes (Signed)
Patient ID: Bridget Martinez, female   DOB: 19-Apr-1951, 61 y.o.   MRN: [id number] TCTS DAILY PROGRESS NOTE                   [address]            [place] [address]          [phone]      2 Days Post-Op Procedure(s) (LRB): CORONARY ARTERY BYPASS GRAFTING (CABG) (N/A)  Total Length of Stay:  LOS: 2 days   Subjective: Much more alert today, less confused then yesterday  Objective: Vital signs in last 24 hours: Temp:  [97.6 F (36.4 C)-99.5 F (37.5 C)] 97.6 F (36.4 C) (01/20 0740) Pulse Rate:  [64-96] 91  (01/20 0645) Cardiac Rhythm:  [-] A-V Sequential paced;Bundle branch block (01/19 2000) Resp:  [8-26] 20  (01/20 0645) BP: (85-136)/(37-87) 106/87 mmHg (01/20 0615) SpO2:  [95 %-100 %] 98 % (01/20 0645) Arterial Line BP: (106-137)/(41-48) 116/48 mmHg (01/19 1700) FiO2 (%):  [40 %] 40 % (01/19 1125) Weight:  [170 lb 13.7 oz (77.5 kg)] 170 lb 13.7 oz (77.5 kg) (01/20 0615)  Wt Readings from Last 3 Encounters:  09/29/11 170 lb 13.7 oz (77.5 kg)  09/29/11 170 lb 13.7 oz (77.5 kg)  09/25/11 158 lb (71.668 kg)    Weight change: 13 lb 0.1 oz (5.9 kg)   Hemodynamic parameters for last 24 hours: PAP: (27-31)/(10-12) 27/11 mmHg  Intake/Output from previous day: 01/19 0701 - 01/20 0700 In: 1591.7 [P.O.:960; I.V.:581.7; IV Piggyback:50] Out: 775 [Urine:555; Chest Tube:220]  Intake/Output this shift:    Current Meds: Scheduled Meds:   . acetaminophen  1,000 mg Oral Q6H  . aspirin EC  325 mg Oral Daily  . bisacodyl  10 mg Oral Daily   Or  . bisacodyl  10 mg Rectal Daily  . cefUROXime (ZINACEF)  IV  1.5 g Intravenous Q12H  . clopidogrel  75 mg Oral Q breakfast  . docusate sodium  200 mg Oral Daily  . escitalopram  10 mg Oral Daily  . insulin aspart  0-24 Units Subcutaneous Q4H  . insulin glargine  24 Units Subcutaneous Daily  . levothyroxine  100 mcg Oral QAC breakfast  . methylPREDNISolone (SOLU-MEDROL) injection  80 mg Intravenous Once  .  metoprolol tartrate  12.5 mg Oral BID  . pantoprazole  40 mg Oral Q1200  . rosuvastatin  40 mg Oral q1800  . sodium chloride  3 mL Intravenous Q12H  . DISCONTD: acetaminophen (TYLENOL) oral liquid 160 mg/5 mL  975 mg Per Tube Q6H  . DISCONTD: aspirin  81 mg Per Tube Daily  . DISCONTD: insulin aspart  0-24 Units Subcutaneous Q4H  . DISCONTD: insulin glargine  24 Units Subcutaneous BID  . DISCONTD: insulin glargine  24 Units Subcutaneous Q0700  . DISCONTD: metoprolol tartrate  12.5 mg Per Tube BID   Continuous Infusions:   . sodium chloride    . lactated ringers 20 mL/hr (09/27/11 1921)  . milrinone 0.2 mcg/kg/min (09/28/11 1051)  . phenylephrine (NEO-SYNEPHRINE) Adult infusion 12 mcg/min (09/29/11 0645)  . DISCONTD: sodium chloride 20 mL/hr at 09/28/11 0800  . DISCONTD: sodium chloride 20 mL/hr at 09/27/11 1500  . DISCONTD: dexmedetomidine (PRECEDEX) IV infusion Stopped (09/27/11 1845)  . DISCONTD: DOPamine 2 mcg/kg/min (09/28/11 0800)  . DISCONTD: nitroGLYCERIN     PRN Meds:.metoprolol, midazolam, morphine, ondansetron (ZOFRAN) IV, oxyCODONE, sodium chloride  General appearance: alert and appears older than stated age Neurologic: intact  Heart: regular rate and rhythm, S1, S2 normal, no murmur, click, rub or gallop Lungs: clear to auscultation bilaterally Abdomen: soft, non-tender; bowel sounds normal; no masses,  no organomegaly  Lab Results: CBC: Basename 09/29/11 0336 09/28/11 1619 09/28/11 1618  WBC 14.5* -- 13.1*  HGB 9.3* 10.5* --  HCT 27.6* 31.0* --  PLT 164 -- 187   BMET:  Basename 09/29/11 0300 09/28/11 1619 09/28/11 0415  NA 136 140 --  K 3.7 3.8 --  CL 103 102 --  CO2 25 -- 23  GLUCOSE 103* 131* --  BUN 8 5* --  CREATININE 0.75 0.80 --  CALCIUM 9.1 -- 8.5    PT/INR:  Basename 09/27/11 1430  LABPROT 17.3*  INR 1.39   Radiology: Dg Chest Portable 1 View In Am  09/29/2011  *RADIOLOGY REPORT*  Clinical Data: Postop cardiac surgery.  PORTABLE CHEST - 1  VIEW  Comparison: 09/28/2011  Findings: Prior CABG.  Left chest tube remains in place, unchanged. Interval extubation and removal of Swan-Ganz catheter.  No pneumothorax.  Stable cardiomegaly.  Minimal bibasilar atelectasis.  IMPRESSION: Interval extubation.  No pneumothorax.  Bibasilar atelectasis.  Original Report Authenticated By: Raelyn Number, M.D.   Dg Chest Portable 1 View In Am  09/28/2011  *RADIOLOGY REPORT*  Clinical Data: CABG.  PORTABLE CHEST - 1 VIEW  Comparison: 09/27/2011  Findings: Support devices are unchanged.  Changes of CABG.  No pneumothorax.  Cardiomegaly.  No confluent opacities or effusions.  IMPRESSION: Stable postoperative changes.  No pneumothorax.  Original Report Authenticated By: Raelyn Number, M.D.   Dg Chest Portable 1 View  09/27/2011  *RADIOLOGY REPORT*  Clinical Data: Post open heart surgery  PORTABLE CHEST - 1 VIEW  Comparison: Chest x-ray of 09/24/2011  Findings: The tip of the endotracheal tube is approximately 3.5 cm above the carina.  A left chest tube is present and no pneumothorax is seen.  Mild basilar atelectasis is present.  A Swan-Ganz catheter is noted with the tip in the main pulmonary artery.  Mild cardiomegaly is stable.  IMPRESSION:  1.  Endotracheal tube tip approximately 3.5 cm above carina. 2.  Left chest tube.  No pneumothorax. 3.  Bibasilar linear atelectasis  Original Report Authenticated By: Joretta Bachelor, M.D.     Assessment/Plan: S/P Procedure(s) (LRB): CORONARY ARTERY BYPASS GRAFTING (CABG) (N/A) d/c tubes/lines See progression orders     Grace Isaac MD  Beeper [phone] Office [phone] 09/29/2011 8:24 AM

## 2011-09-29 NOTE — Progress Notes (Signed)
CBG: 55  Treatment: 15 GM carbohydrate snackx2  Symptoms: None  Follow-up CBG: Time:2121 CBG Result:94  Possible Reasons for Event: Vomiting and Inadequate meal intake  Comments/MD notified:pt states she experiences frequent hypoglycemic events at home pta    Bridget Martinez Other

## 2011-09-30 ENCOUNTER — Inpatient Hospital Stay (HOSPITAL_COMMUNITY): Payer: BC Managed Care – PPO

## 2011-09-30 DIAGNOSIS — E1165 Type 2 diabetes mellitus with hyperglycemia: Secondary | ICD-10-CM

## 2011-09-30 DIAGNOSIS — IMO0001 Reserved for inherently not codable concepts without codable children: Secondary | ICD-10-CM

## 2011-09-30 DIAGNOSIS — I5022 Chronic systolic (congestive) heart failure: Secondary | ICD-10-CM

## 2011-09-30 DIAGNOSIS — I2510 Atherosclerotic heart disease of native coronary artery without angina pectoris: Principal | ICD-10-CM

## 2011-09-30 LAB — GLUCOSE, CAPILLARY
Glucose-Capillary: 105 mg/dL — ABNORMAL HIGH (ref 70–99)
Glucose-Capillary: 129 mg/dL — ABNORMAL HIGH (ref 70–99)
Glucose-Capillary: 144 mg/dL — ABNORMAL HIGH (ref 70–99)
Glucose-Capillary: 67 mg/dL — ABNORMAL LOW (ref 70–99)
Glucose-Capillary: 68 mg/dL — ABNORMAL LOW (ref 70–99)
Glucose-Capillary: 92 mg/dL (ref 70–99)
Glucose-Capillary: 94 mg/dL (ref 70–99)

## 2011-09-30 LAB — BASIC METABOLIC PANEL
BUN: 15 mg/dL (ref 6–23)
CO2: 27 mEq/L (ref 19–32)
Calcium: 8.5 mg/dL (ref 8.4–10.5)
Chloride: 99 mEq/L (ref 96–112)
Creatinine, Ser: 0.9 mg/dL (ref 0.50–1.10)
GFR calc Af Amer: 78 mL/min — ABNORMAL LOW (ref >90)
GFR calc non Af Amer: 68 mL/min — ABNORMAL LOW (ref >90)
Glucose, Bld: 107 mg/dL — ABNORMAL HIGH (ref 70–99)
Potassium: 3.5 mEq/L (ref 3.5–5.1)
Sodium: 135 mEq/L (ref 135–145)

## 2011-09-30 LAB — CBC
HCT: 28.5 % — ABNORMAL LOW (ref 36.0–46.0)
Hemoglobin: 9.4 g/dL — ABNORMAL LOW (ref 12.0–15.0)
MCH: 28.9 pg (ref 26.0–34.0)
MCHC: 33 g/dL (ref 30.0–36.0)
MCV: 87.7 fL (ref 78.0–100.0)
Platelets: 195 10*3/uL (ref 150–400)
RBC: 3.25 MIL/uL — ABNORMAL LOW (ref 3.87–5.11)
RDW: 15.8 % — ABNORMAL HIGH (ref 11.5–15.5)
WBC: 13.1 10*3/uL — ABNORMAL HIGH (ref 4.0–10.5)

## 2011-09-30 MED ORDER — CARVEDILOL 6.25 MG PO TABS
6.2500 mg | ORAL_TABLET | Freq: Two times a day (BID) | ORAL | Status: DC
  Filled 2011-09-30 (×3): qty 1

## 2011-09-30 MED ORDER — CARVEDILOL 3.125 MG PO TABS: 3.1250 mg | ORAL_TABLET | Freq: Two times a day (BID) | ORAL | Status: DC

## 2011-09-30 MED ORDER — FUROSEMIDE 40 MG PO TABS
40.0000 mg | ORAL_TABLET | Freq: Every day | ORAL | Status: DC
  Administered 2011-09-30: 40 mg via ORAL
  Filled 2011-09-30 (×2): qty 1

## 2011-09-30 MED ORDER — INSULIN GLARGINE 100 UNIT/ML ~~LOC~~ SOLN
10.0000 [IU] | Freq: Two times a day (BID) | SUBCUTANEOUS | Status: DC
  Administered 2011-09-30 – 2011-10-02 (×5): 10 [IU] via SUBCUTANEOUS

## 2011-09-30 MED ORDER — ASPIRIN EC 81 MG PO TBEC
81.0000 mg | DELAYED_RELEASE_TABLET | Freq: Every day | ORAL | Status: DC
  Administered 2011-09-30: 11:00:00 via ORAL
  Administered 2011-10-01 – 2011-10-02 (×2): 81 mg via ORAL
  Filled 2011-09-30 (×3): qty 1

## 2011-09-30 MED ORDER — POTASSIUM CHLORIDE 10 MEQ/50ML IV SOLN
10.0000 meq | INTRAVENOUS | Status: DC | PRN
  Administered 2011-09-30: 10 meq via INTRAVENOUS
  Filled 2011-09-30: qty 100
  Filled 2011-09-30: qty 50

## 2011-09-30 MED ORDER — SODIUM CHLORIDE 0.9 % IJ SOLN
3.0000 mL | Freq: Two times a day (BID) | INTRAMUSCULAR | Status: DC
  Administered 2011-09-30 – 2011-10-02 (×5): 3 mL via INTRAVENOUS

## 2011-09-30 MED ORDER — FUROSEMIDE 10 MG/ML IJ SOLN
20.0000 mg | Freq: Once | INTRAMUSCULAR | Status: AC
  Administered 2011-09-30: 20 mg via INTRAVENOUS
  Filled 2011-09-30: qty 2

## 2011-09-30 MED ORDER — FUROSEMIDE 10 MG/ML IJ SOLN
40.0000 mg | Freq: Two times a day (BID) | INTRAMUSCULAR | Status: DC
  Filled 2011-09-30 (×2): qty 4

## 2011-09-30 MED ORDER — INSULIN ASPART 100 UNIT/ML ~~LOC~~ SOLN: 0.0000 [IU] | Freq: Every day | SUBCUTANEOUS | Status: DC

## 2011-09-30 MED ORDER — CARVEDILOL 3.125 MG PO TABS
3.1250 mg | ORAL_TABLET | Freq: Two times a day (BID) | ORAL | Status: DC
  Administered 2011-10-01 – 2011-10-02 (×3): 3.125 mg via ORAL
  Filled 2011-09-30 (×5): qty 1

## 2011-09-30 MED ORDER — TRAMADOL HCL 50 MG PO TABS: 50.0000 mg | ORAL_TABLET | ORAL | Status: DC | PRN

## 2011-09-30 MED ORDER — DIGOXIN 250 MCG PO TABS
250.0000 ug | ORAL_TABLET | Freq: Every day | ORAL | Status: DC
  Administered 2011-09-30 – 2011-10-02 (×3): 250 ug via ORAL
  Filled 2011-09-30 (×3): qty 1

## 2011-09-30 MED ORDER — POTASSIUM CHLORIDE CRYS ER 20 MEQ PO TBCR
20.0000 meq | EXTENDED_RELEASE_TABLET | Freq: Once | ORAL | Status: AC
  Administered 2011-09-30: 20 meq via ORAL
  Filled 2011-09-30: qty 1

## 2011-09-30 MED ORDER — INSULIN GLARGINE 100 UNIT/ML ~~LOC~~ SOLN: 24.0000 [IU] | Freq: Every day | SUBCUTANEOUS | Status: DC

## 2011-09-30 MED ORDER — INSULIN ASPART 100 UNIT/ML ~~LOC~~ SOLN
0.0000 [IU] | Freq: Three times a day (TID) | SUBCUTANEOUS | Status: DC
  Administered 2011-09-30 – 2011-10-01 (×3): 3 [IU] via SUBCUTANEOUS
  Administered 2011-10-01: 11 [IU] via SUBCUTANEOUS
  Administered 2011-10-02: 3 [IU] via SUBCUTANEOUS
  Administered 2011-10-02: 20 [IU] via SUBCUTANEOUS

## 2011-09-30 MED ORDER — MOVING RIGHT ALONG BOOK
Freq: Once | Status: AC
  Administered 2011-09-30: 09:00:00
  Filled 2011-09-30: qty 1

## 2011-09-30 MED FILL — Nitroglycerin IV Soln 5 MG/ML: INTRAVENOUS | Qty: 0.84 | Status: AC

## 2011-09-30 MED FILL — Lactated Ringer's Solution: INTRAVENOUS | Qty: 500 | Status: AC

## 2011-09-30 MED FILL — Potassium Chloride Inj 2 mEq/ML: INTRAVENOUS | Qty: 40 | Status: AC

## 2011-09-30 MED FILL — Heparin Sodium (Porcine) Inj 1000 Unit/ML: INTRAMUSCULAR | Qty: 2.5 | Status: AC

## 2011-09-30 MED FILL — Dexmedetomidine HCl IV Soln 200 MCG/2ML: INTRAVENOUS | Qty: 2 | Status: AC

## 2011-09-30 MED FILL — Sodium Bicarbonate IV Soln 8.4%: INTRAVENOUS | Qty: 0.3 | Status: AC

## 2011-09-30 MED FILL — Verapamil HCl IV Soln 2.5 MG/ML: INTRAVENOUS | Qty: 4 | Status: AC

## 2011-09-30 MED FILL — Magnesium Sulfate Inj 50%: INTRAMUSCULAR | Qty: 2 | Status: AC

## 2011-09-30 NOTE — Progress Notes (Signed)
3 Days Post-Op Procedure(s) (LRB): CORONARY ARTERY BYPASS GRAFTING (CABG) (N/A)                                                [address]            [place] [address]          [phone]      Subjective: Comfortable this am, walking in hall NSR CXR clear  Objective: Vital signs in last 24 hours: Temp:  [97.4 F (36.3 C)-98 F (36.7 C)] 97.4 F (36.3 C) (01/21 0807) Pulse Rate:  [64-91] 69  (01/21 0700) Cardiac Rhythm:  [-] Normal sinus rhythm (01/21 0751) Resp:  [15-38] 17  (01/21 0700) BP: (77-122)/(35-66) 98/46 mmHg (01/21 0700) SpO2:  [94 %-100 %] 98 % (01/21 0700) Weight:  [169 lb 15.6 oz (77.1 kg)] 169 lb 15.6 oz (77.1 kg) (01/21 0500)  Hemodynamic parameters for last 24 hours:   stable  Intake/Output from previous day: 01/20 0701 - 01/21 0700 In: 1527.4 [P.O.:960; I.V.:513.4; IV Piggyback:54] Out: 2130 [Urine:2130] Intake/Output this shift:    Lungs clear Incision clean  extrem warm  Lab Results:  Basename 09/30/11 0400 09/29/11 0336  WBC 13.1* 14.5*  HGB 9.4* 9.3*  HCT 28.5* 27.6*  PLT 195 164   BMET:  Basename 09/30/11 0400 09/29/11 0300  NA 135 136  K 3.5 3.7  CL 99 103  CO2 27 25  GLUCOSE 107* 103*  BUN 15 8  CREATININE 0.90 0.75  CALCIUM 8.5 9.1    PT/INR:  Basename 09/27/11 1430  LABPROT 17.3*  INR 1.39   ABG    Component Value Date/Time   PHART 7.362 09/28/2011 1148   HCO3 21.7 09/28/2011 1148   TCO2 25 09/28/2011 1619   ACIDBASEDEF 3.0* 09/28/2011 1148   O2SAT 99.0 09/28/2011 1148   CBG (last 3)   Basename 09/30/11 0354 09/30/11 0102 09/30/11 0012  GLUCAP 94 105* 67*    Assessment/Plan: S/P Procedure(s) (LRB): CORONARY ARTERY BYPASS GRAFTING (CABG) (N/A) Supp K+ Transfer to 2000 Plavix for coronary endarterectomy Resume coreg   LOS: 3 days    VAN TRIGT III,Kymiah Araiza 09/30/2011

## 2011-09-30 NOTE — Progress Notes (Signed)
Patient ambulated from 2300 to 2017 without difficulty on room air. Bridget Martinez

## 2011-09-30 NOTE — Progress Notes (Addendum)
Inpatient Diabetes Program Recommendations  AACE/ADA: New Consensus Statement on Inpatient Glycemic Control (2009)  Target Ranges:  Prepandial:   less than 140 mg/dL      Peak postprandial:   less than 180 mg/dL (1-2 hours)      Critically ill patients:  140 - 180 mg/dL   Reason for Visit:HgbA1C: 10.3 %   Please order outpatient Diabetes Education.

## 2011-09-30 NOTE — Progress Notes (Signed)
UR Completed.  Vergie Living [id number] 09/30/2011

## 2011-09-30 NOTE — Progress Notes (Signed)
Subjective:   61 y/o woman with DM2 and CAD, CHF with iCM EF 20-25%. Underwent CABG 1/18.  Doing well. Ambulating unit. Chest sore. Mild edema. No AF or other arrhythmias.      Intake/Output Summary (Last 24 hours) at 09/30/11 1405 Last data filed at 09/30/11 1300  Gross per 24 hour  Intake 1527.41 ml  Output   1571 ml  Net -43.59 ml    Current meds:    . acetaminophen  1,000 mg Oral Q6H  . aspirin EC  81 mg Oral Daily  . bisacodyl  10 mg Oral Daily   Or  . bisacodyl  10 mg Rectal Daily  . carvedilol  3.125 mg Oral BID WC  . clopidogrel  75 mg Oral Q breakfast  . digoxin  250 mcg Oral Daily  . docusate sodium  200 mg Oral Daily  . escitalopram  10 mg Oral Daily  . furosemide  40 mg Oral Daily  . insulin aspart  0-20 Units Subcutaneous TID WC  . insulin aspart  0-5 Units Subcutaneous QHS  . insulin glargine  10 Units Subcutaneous BID  . levothyroxine  100 mcg Oral QAC breakfast  . moving right along book   Does not apply Once  . pantoprazole  40 mg Oral Q1200  . potassium chloride  20 mEq Oral Once  . rosuvastatin  40 mg Oral q1800  . sodium chloride  3 mL Intravenous Q12H  . DISCONTD: aspirin EC  325 mg Oral Daily  . DISCONTD: carvedilol  3.125 mg Oral BID WC  . DISCONTD: carvedilol  6.25 mg Oral BID WC  . DISCONTD: furosemide  40 mg Intravenous Q12H  . DISCONTD: insulin aspart  0-24 Units Subcutaneous Q4H  . DISCONTD: insulin glargine  24 Units Subcutaneous Daily  . DISCONTD: insulin glargine  24 Units Subcutaneous QHS  . DISCONTD: metoprolol tartrate  12.5 mg Oral BID  . DISCONTD: sodium chloride  3 mL Intravenous Q12H   Infusions:    . DISCONTD: sodium chloride    . DISCONTD: lactated ringers 20 mL/hr (09/27/11 1921)  . DISCONTD: phenylephrine (NEO-SYNEPHRINE) Adult infusion Stopped (09/30/11 [id number])     Objective:  Blood pressure 110/68, pulse 72, temperature 97.4 F (36.3 C), temperature source Oral, resp. rate 29, height 5\' 3"  (1.6 m), weight 77.1  kg (169 lb 15.6 oz), SpO2 98.00%. Weight change: -0.4 kg (-14.1 oz)  Physical Exam: General:  Well appearing. No resp difficulty HEENT: normal Neck: supple. IJ in place Carotids 2+ bilat; no bruits. No lymphadenopathy or thryomegaly appreciated. Cor: PMI nondisplaced. Regular rate & rhythm. No rubs, gallops or murmurs. CABG scar looks good Lungs: clear. Decreased at bases.  Abdomen: soft, nontender, nondistended. No hepatosplenomegaly. No bruits or masses. Good bowel sounds. Extremities: no cyanosis, clubbing, rash 1+ edema Neuro: alert & orientedx3, cranial nerves grossly intact. moves all 4 extremities w/o difficulty. Affect pleasant  Telemetry:  Sinus  Lab Results: Basic Metabolic Panel:  Lab [id number] 09/29/11 0300 09/28/11 1619 09/28/11 1618 09/28/11 0415 09/27/11 2023 09/24/11 1400  NA 135 136 140 -- 138 138 --  K 3.5 3.7 -- -- -- -- --  CL 99 103 102 -- 103 103 --  CO2 27 25 -- -- 23 -- 23  GLUCOSE 107* 103* 131* -- 172* 100* --  BUN 15 8 5* -- 7 6 --  CREATININE 0.90 0.75 0.80 0.75 0.73 -- --  CALCIUM 8.5 9.1 -- -- 8.5 -- 9.7  MG -- -- -- 2.2  2.4 -- --  PHOS -- -- -- -- -- -- --   Liver Function Tests:  Lab 09/24/11 1400  AST 54*  ALT 26  ALKPHOS 166*  BILITOT 0.5  PROT 6.8  ALBUMIN 3.2*   No results found for this basename: LIPASE:5,AMYLASE:5 in the last 168 hours No results found for this basename: AMMONIA:5 in the last 168 hours CBC:  Lab 09/30/11 0400 09/29/11 0336 09/28/11 1619 09/28/11 1618 09/28/11 0415 09/27/11 1430  WBC 13.1* 14.5* -- 13.1* 11.0* 13.6*  NEUTROABS -- -- -- -- -- --  HGB 9.4* 9.3* 10.5* 9.9* 9.5* --  HCT 28.5* 27.6* 31.0* 29.9* 27.9* --  MCV 87.7 87.9 -- 87.7 87.2 85.2  PLT 195 164 -- 187 168 175   Cardiac Enzymes: No results found for this basename: CKTOTAL:5,CKMB:5,CKMBINDEX:5,TROPONINI:5 in the last 168 hours BNP: No components found with this basename: POCBNP:5 CBG:  Lab 09/30/11 1230 09/30/11 0804 09/30/11 0354  09/30/11 0102 09/30/11 0012  GLUCAP 129* 68* 94 105* 46*   Microbiology: Lab Results  Component Value Date   CULT PROTEUS MIRABILIS 08/09/2011   No results found for this basename: CULT:2,SDES:2 in the last 168 hours  Imaging: Dg Chest Portable 1 View In Am  09/29/2011  *RADIOLOGY REPORT*  Clinical Data: Postop cardiac surgery.  PORTABLE CHEST - 1 VIEW  Comparison: 09/28/2011  Findings: Prior CABG.  Left chest tube remains in place, unchanged. Interval extubation and removal of Swan-Ganz catheter.  No pneumothorax.  Stable cardiomegaly.  Minimal bibasilar atelectasis.  IMPRESSION: Interval extubation.  No pneumothorax.  Bibasilar atelectasis.  Original Report Authenticated By: Raelyn Number, M.D.   Dg Chest Port 1v Same Day  09/30/2011  *RADIOLOGY REPORT*  Clinical Data: Postop from cardiac surgery.  PORTABLE CHEST - 1 VIEW SAME DAY  Comparison: 09/29/2011  Findings: 0620 hours.  Left chest tube has been removed in the interval.  No evidence for residual pneumothorax.  The right IJ sheath remains in place. The cardiopericardial silhouette is enlarged.  Lungs are clear.  IMPRESSION: Interval chest tube removal without evidence for pneumothorax.  Original Report Authenticated By: ERIC A. MANSELL, M.D.     ASSESSMENT:  1. CAD s/p CABG 1/18 2. Chronic systolic HF EF 0000000 3.  DM2  PLAN/DISCUSSION:  Doing well. Post CABG. Mild volume overload. Continue with gentle diuresis as weight still up 10 pounds. Will follow.    LOS: 3 days    Glori Bickers, MD 09/30/2011, 2:05 PM

## 2011-10-01 ENCOUNTER — Encounter (HOSPITAL_COMMUNITY): Payer: Self-pay | Admitting: Cardiothoracic Surgery

## 2011-10-01 ENCOUNTER — Inpatient Hospital Stay (HOSPITAL_COMMUNITY): Payer: BC Managed Care – PPO

## 2011-10-01 LAB — GLUCOSE, CAPILLARY
Glucose-Capillary: 114 mg/dL — ABNORMAL HIGH (ref 70–99)
Glucose-Capillary: 273 mg/dL — ABNORMAL HIGH (ref 70–99)
Glucose-Capillary: 139 mg/dL — ABNORMAL HIGH (ref 70–99)
Glucose-Capillary: 109 mg/dL — ABNORMAL HIGH (ref 70–99)

## 2011-10-01 LAB — BASIC METABOLIC PANEL
BUN: 15 mg/dL (ref 6–23)
CO2: 28 mEq/L (ref 19–32)
Calcium: 9.5 mg/dL (ref 8.4–10.5)
Chloride: 94 mEq/L — ABNORMAL LOW (ref 96–112)
Creatinine, Ser: 0.89 mg/dL (ref 0.50–1.10)
GFR calc Af Amer: 79 mL/min — ABNORMAL LOW (ref >90)
GFR calc non Af Amer: 69 mL/min — ABNORMAL LOW (ref >90)
Glucose, Bld: 155 mg/dL — ABNORMAL HIGH (ref 70–99)
Potassium: 4.2 mEq/L (ref 3.5–5.1)
Sodium: 136 mEq/L (ref 135–145)

## 2011-10-01 LAB — CBC
HCT: 32.5 % — ABNORMAL LOW (ref 36.0–46.0)
Hemoglobin: 10.7 g/dL — ABNORMAL LOW (ref 12.0–15.0)
MCH: 29.2 pg (ref 26.0–34.0)
MCHC: 32.9 g/dL (ref 30.0–36.0)
MCV: 88.6 fL (ref 78.0–100.0)
Platelets: 243 10*3/uL (ref 150–400)
RBC: 3.67 MIL/uL — ABNORMAL LOW (ref 3.87–5.11)
RDW: 15.4 % (ref 11.5–15.5)
WBC: 8.3 10*3/uL (ref 4.0–10.5)

## 2011-10-01 MED ORDER — FUROSEMIDE 40 MG PO TABS: 40.0000 mg | ORAL_TABLET | Freq: Every day | ORAL | Status: DC

## 2011-10-01 MED ORDER — CARVEDILOL 6.25 MG PO TABS: 3.1250 mg | ORAL_TABLET | Freq: Two times a day (BID) | ORAL | Status: DC

## 2011-10-01 MED ORDER — TRAMADOL HCL 50 MG PO TABS: 50.0000 mg | ORAL_TABLET | ORAL | Status: DC | PRN

## 2011-10-01 MED ORDER — POTASSIUM CHLORIDE CRYS ER 20 MEQ PO TBCR
20.0000 meq | EXTENDED_RELEASE_TABLET | Freq: Two times a day (BID) | ORAL | Status: DC
  Administered 2011-10-01 (×2): 20 meq via ORAL
  Filled 2011-10-01 (×4): qty 1

## 2011-10-01 MED ORDER — FUROSEMIDE 40 MG PO TABS
40.0000 mg | ORAL_TABLET | Freq: Two times a day (BID) | ORAL | Status: DC
  Filled 2011-10-01 (×2): qty 1

## 2011-10-01 MED ORDER — METOLAZONE 5 MG PO TABS
5.0000 mg | ORAL_TABLET | Freq: Every day | ORAL | Status: DC
  Administered 2011-10-02: 5 mg via ORAL
  Filled 2011-10-01: qty 1

## 2011-10-01 MED ORDER — FUROSEMIDE 40 MG PO TABS
40.0000 mg | ORAL_TABLET | Freq: Two times a day (BID) | ORAL | Status: DC
  Administered 2011-10-01 (×2): 40 mg via ORAL
  Filled 2011-10-01 (×5): qty 1

## 2011-10-01 MED ORDER — OXYCODONE HCL 5 MG PO TABS: 5.0000 mg | ORAL_TABLET | ORAL | Status: DC | PRN

## 2011-10-01 MED ORDER — CLOPIDOGREL BISULFATE 75 MG PO TABS: 75.0000 mg | ORAL_TABLET | Freq: Every day | ORAL | Status: DC

## 2011-10-01 NOTE — Progress Notes (Signed)
Patient ambulated approximately 400 ft with RN and tolerated well.  Steady on feet and said she felt much better this morning.  Left resting comfortably in room with call bell within reach.  Will continue to monitor.  Bridget Martinez Patient

## 2011-10-01 NOTE — Progress Notes (Signed)
Subjective:   61 y/o woman with DM2 and CAD, CHF with iCM EF 20-25%. Underwent CABG 1/18.  Doing well. Ambulating unit. I/Os not impressively negative but weight down 5 pounds. No AF or other arrhythmias.     Intake/Output Summary (Last 24 hours) at 10/01/11 0933 Last data filed at 10/01/11 0800  Gross per 24 hour  Intake   1085 ml  Output    250 ml  Net    835 ml    Current meds:    . acetaminophen  1,000 mg Oral Q6H  . aspirin EC  81 mg Oral Daily  . bisacodyl  10 mg Oral Daily   Or  . bisacodyl  10 mg Rectal Daily  . carvedilol  3.125 mg Oral BID WC  . clopidogrel  75 mg Oral Q breakfast  . digoxin  250 mcg Oral Daily  . docusate sodium  200 mg Oral Daily  . escitalopram  10 mg Oral Daily  . furosemide  20 mg Intravenous Once  . furosemide  40 mg Oral Daily  . insulin aspart  0-20 Units Subcutaneous TID WC  . insulin aspart  0-5 Units Subcutaneous QHS  . insulin glargine  10 Units Subcutaneous BID  . levothyroxine  100 mcg Oral QAC breakfast  . pantoprazole  40 mg Oral Q1200  . potassium chloride  20 mEq Oral Once  . potassium chloride  20 mEq Oral BID  . rosuvastatin  40 mg Oral q1800  . sodium chloride  3 mL Intravenous Q12H  . DISCONTD: furosemide  40 mg Intravenous BID   Infusions:     Objective:  Blood pressure 108/54, pulse 78, temperature 97.6 F (36.4 C), temperature source Oral, resp. rate 18, height 5\' 3"  (1.6 m), weight 74.7 kg (164 lb 10.9 oz), SpO2 97.00%. Weight change: -2.4 kg (-5 lb 4.7 oz)  Physical Exam: General:  Well appearing. No resp difficulty HEENT: normal Neck: supple. IJ in place Carotids 2+ bilat; no bruits. No lymphadenopathy or thryomegaly appreciated. Cor: PMI nondisplaced. Regular rate & rhythm. No rubs, gallops or murmurs. CABG scar looks good Lungs: clear. Decreased at bases.  Abdomen: soft, nontender, nondistended. No hepatosplenomegaly. No bruits or masses. Good bowel sounds. Extremities: no cyanosis, clubbing, rash  1+ edema Neuro: alert & orientedx3, cranial nerves grossly intact. moves all 4 extremities w/o difficulty. Affect pleasant  Telemetry:  Sinus  Lab Results: Basic Metabolic Panel:  Lab [id number] 0710 09/30/11 0400 09/29/11 0300 09/28/11 1619 09/28/11 1618 09/28/11 0415 09/24/11 1400  NA 136 135 136 140 -- 138 --  K 4.2 3.5 -- -- -- -- --  CL 94* 99 103 102 -- 103 --  CO2 28 27 25  -- -- 23 23  GLUCOSE 155* 107* 103* 131* -- 172* --  BUN 15 15 8  5* -- 7 --  CREATININE 0.89 0.90 0.75 0.80 0.75 -- --  CALCIUM 9.5 8.5 9.1 -- -- 8.5 9.7  MG -- -- -- -- 2.2 2.4 --  PHOS -- -- -- -- -- -- --   Liver Function Tests:  Lab 09/24/11 1400  AST 54*  ALT 26  ALKPHOS 166*  BILITOT 0.5  PROT 6.8  ALBUMIN 3.2*   No results found for this basename: LIPASE:5,AMYLASE:5 in the last 168 hours No results found for this basename: AMMONIA:5 in the last 168 hours CBC:  Lab 10/01/11 0710 09/30/11 0400 09/29/11 0336 09/28/11 1619 09/28/11 1618 09/28/11 0415  WBC 8.3 13.1* 14.5* -- 13.1* 11.0*  NEUTROABS -- -- -- -- -- --  HGB 10.7* 9.4* 9.3* 10.5* 9.9* --  HCT 32.5* 28.5* 27.6* 31.0* 29.9* --  MCV 88.6 87.7 87.9 -- 87.7 87.2  PLT 243 195 164 -- 187 168   Cardiac Enzymes: No results found for this basename: CKTOTAL:5,CKMB:5,CKMBINDEX:5,TROPONINI:5 in the last 168 hours BNP: No components found with this basename: POCBNP:5 CBG:  Lab 10/01/11 0616 09/30/11 2051 09/30/11 1645 09/30/11 1230 09/30/11 0804  GLUCAP 114* 92 144* 129* 68*   Microbiology: Lab Results  Component Value Date   CULT PROTEUS MIRABILIS 08/09/2011   No results found for this basename: CULT:2,SDES:2 in the last 168 hours  Imaging: Dg Chest 2 View  10/01/2011  *RADIOLOGY REPORT*  Clinical Data: Post CABG, hypertension, diabetes  CHEST - 2 VIEW  Comparison: 09/30/2011  Findings: Enlargement of cardiac silhouette post CABG. Minimal pulmonary vascular congestion. Interval removal of right jugular line. Residual subsegmental  atelectasis left lower lobe. Lungs otherwise clear. Minimal peribronchial thickening. Tiny bilateral pleural effusions. No pneumothorax. Bones demineralized. Epicardial pacing wires noted.  IMPRESSION: Enlargement of cardiac silhouette post CABG. Subsegmental atelectasis left lower lobe.  Original Report Authenticated By: Burnetta Sabin, M.D.   Dg Chest Port 1v Same Day  09/30/2011  *RADIOLOGY REPORT*  Clinical Data: Postop from cardiac surgery.  PORTABLE CHEST - 1 VIEW SAME DAY  Comparison: 09/29/2011  Findings: 0620 hours.  Left chest tube has been removed in the interval.  No evidence for residual pneumothorax.  The right IJ sheath remains in place. The cardiopericardial silhouette is enlarged.  Lungs are clear.  IMPRESSION: Interval chest tube removal without evidence for pneumothorax.  Original Report Authenticated By: ERIC A. MANSELL, M.D.     ASSESSMENT:  1. CAD s/p CABG 1/18 2. Chronic systolic HF EF 0000000 3.  DM2  PLAN/DISCUSSION:  Doing great. Post CABG. Mild volume overload. Continue with gentle diuresis as tolerated. Will follow.    LOS: 4 days    Glori Bickers, MD 10/01/2011, 9:33 AM

## 2011-10-01 NOTE — Progress Notes (Signed)
Patient ambulated 32ft at slow pace on room air. Patient tolerated well but was tired at end of walk. Encouraged patient to one more walk before end of day. Vergie Living

## 2011-10-01 NOTE — Progress Notes (Signed)
Patient ambulated approximately 150 ft with RN around the circle.  Steady on her feet.  Stated that she felt dizzy for a bit during the walk.  Vital signs stable. Left resting in bed with call bell within reach.  Will continue to monitor.  Randell Patient

## 2011-10-01 NOTE — Progress Notes (Signed)
CARDIAC REHAB PHASE I   PRE:  Rate/Rhythm: 84SR  BP:  Supine:   Sitting: 118/49  Standing:    SaO2: 99%RA  MODE:  Ambulation: 490 ft   POST:  Rate/Rhythem: 96SR  BP:  Supine:   Sitting: 98/63  Standing:    SaO2: 100%RA 1045-1105 Pt walked 490 ft on RA with handheld asst. Gait steady. Tolerated well. BP lower after walk but pt denied dizziness. To recliner with call bell.  Jeani Sow

## 2011-10-01 NOTE — Discharge Summary (Signed)
Physician Discharge Summary  Patient ID: JOURDIN OCASIO MRN: [id number] DOB/AGE: [birth year] 61 y.o.  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 History of hyperthyoridsim   Procedure (s):  1. Coronary artery bypass grafting x4 (left internal mammary artery to  left anterior descending, saphenous vein graft to diagonal,  saphenous vein graft to obtuse marginal, saphenous vein graft to  right coronary artery).  2. Coronary endarterectomy of the circumflex obtuse marginal-1.  3. Placement of right femoral arterial line for blood pressure  monitoring.  4. Endoscopic harvest of right leg greater saphenous vein by Dr. Prescott Gum on 09/27/2011.  History of Presenting Illness:      This is a 61 year old Caucasian female diabetic with ischemic cardiomyopathy who was admitted in December with severe heart failure, low EF, and severe three-vessel coronary disease. She also had moderate mitral regurgitation and moderately elevated PA pressures. She was treated medically by Dr. Haroldine Laws and was diuresed over a 30 pounds. Her diabetes is under better control and her venous stasis ulcers of her lower extremities were treated with wound care and have a healed. She is now functional class III with easy fatigue but no bands heart failure symptoms. A follow up echo done last week showed her EF to be improved 25-30%. There is still akinesia of the inferior wall, the mitral valve has mild regurgitation (also improved), and the aortic valve is normal. The patient has had vein mapping showing  adequate veins bilaterally. We would probably her thigh vein only. She's had a carotid duplex scan showing no significant carotid disease. She has had a  pulmonary function test showing adequate PFTs (she has no history of smoking).Potential benefits, risks, and complications of coronary bypass grafting surgery discussed with the patient.it should also be noted that there was a discussion with the mitral valve might need to be cared a replaced but that we would further evaluate this with a Intra-Op TEE. Patient agreed to proceed with surgery. She underwent CABG x4 on 09/27/2011. Intra op TEE revelaed  mild mitral regurgitation that did not require surgical intervention at this time.   Brief Hospital Course:  She was unable to be extubated until the afternoon of postoperative day #1.At that time, she was extubated successfully.She  remained afebrile and hemodynamically stable. Her swan-Ganz, A-line, Foley, and chest tubes were all removed early in her postoperative course.She was started on a low-dose beta blocker. She was weaned off the insulin drip and was restarted on low-dose insulin. Her HGA1c was 10.3 preop and she will require further surveillance as an outpatient from her primary care physician. She was found have acute blood loss anemia. Her H&H was low as 9.3 and 27.6. She did not require a transfusion. Her last H&H of 9.4 and 20.5 respectively. She did have mild confusion postoperatively but her mental status returned to baseline quickly. Narcotics were avoided. She was felt surgically stable for transfer from the intensive care unit to [facility] for further convalescence on 09/30/2011. She continued to progress with cardiac rehabilitation. She was tolerating a diet and had had a bowel movement. She was found to have mild volume overloaded and was diuresed accordingly. Epicardial pacing wires and chest  tube sutures will be removed in the a.m. Provided she remains afebrile, hemodynamically stable, and pending  morning round evaluation, she'll be surgically stable for discharge on 10/02/2011.   Filed Vitals:   10/01/11 0540  BP: 108/54  Pulse: 78  Temp: 97.6 F (36.4 C)  Resp: 18     Latest Vital Signs: Blood pressure 108/54, pulse 78, temperature 97.6 F (36.4 C), temperature source Oral, resp. rate 18, height 5\' 3"  (1.6 m), weight 164 lb 10.9 oz (74.7 kg), SpO2 97.00%.  Physical Exam: Cardiovascular: RRR, no murmurs, gallops, or rubs.  Pulmonary: Clear to auscultation bilaterally; no rales, wheezes, or rhonchi.  Abdomen: Soft, non tender, bowel sounds present.  Extremities: Mild bilateral lower extremity edema.  Wounds: Clean and dry. No erythema or signs of infection.    Discharge Condition:Stable.  Recent laboratory studies:  Lab Results  Component Value Date   WBC 8.3 10/01/2011   HGB 10.7* 10/01/2011   HCT 32.5* 10/01/2011   MCV 88.6 10/01/2011   PLT 243 10/01/2011   Lab Results  Component Value Date   NA 136 10/01/2011   K 4.2 10/01/2011   CL 94* 10/01/2011   CO2 28 10/01/2011   CREATININE 0.89 10/01/2011   GLUCOSE 155* 10/01/2011      Diagnostic Studies: Dg Chest 2 View  10/01/2011  *RADIOLOGY REPORT*  Clinical Data: Post CABG, hypertension, diabetes  CHEST - 2 VIEW  Comparison: 09/30/2011  Findings: Enlargement of cardiac silhouette post CABG. Minimal pulmonary vascular congestion. Interval removal of right jugular line. Residual subsegmental atelectasis left lower lobe. Lungs otherwise clear. Minimal peribronchial thickening. Tiny bilateral pleural effusions. No pneumothorax. Bones demineralized. Epicardial pacing wires noted.  IMPRESSION: Enlargement of cardiac silhouette post CABG. Subsegmental atelectasis left lower lobe.  Original Report Authenticated By: Burnetta Sabin, M.D.  ticated By: Joretta Bachelor, M.D.    Discharge Orders    Future Appointments: Provider: Department: Dept Phone: Center:   10/18/2011 9:00 AM [facility] [phone] None    10/23/2011 12:45 PM Tharon Aquas Trigt III, MD [facility] [phone] [facility]      Discharge Medications: Medication List  As of 10/01/2011  1:40 PM   STOP taking these medications         enalapril 2.5 MG tablet         TAKE these medications         aspirin 325 MG tablet   Take 325 mg by mouth daily.      carvedilol 6.25 MG tablet   Commonly known as: COREG   Take 0.5 tablets (3.125 mg total) by mouth 2 (two) times daily with a meal.      clopidogrel 75 MG tablet   Commonly known as: PLAVIX   Take 1 tablet (75 mg total) by mouth daily with breakfast.      digoxin 0.25 MG tablet   Commonly known as: LANOXIN   Take 1 tablet (250 mcg total) by mouth daily.      escitalopram 10 MG tablet   Commonly known as: LEXAPRO   Take 10 mg by mouth daily.      furosemide 40 MG tablet   Commonly known as: LASIX   Take 1 tablet (40 mg total) by mouth daily. For 3 days then stop.Then to take 0.5 tablet (20 MG) by mouth PRN weight > 168 pounds.       insulin aspart 100 UNIT/ML injection   Commonly known as: novoLOG   Inject 5-10 Units into the  skin 3 (three) times daily with meals. If blood glucose 70-140 mg/dL, then 3 units.  If blood glucose 141-180 mg/dL, then 4 units. If blood glucose 181-220 mg/dL, then 5 units. If blood glucose 221-260 mg/dL, then 6 units. If blood glucose 261-300 mg/dL, then 7 units. If blood glucose 301-340 mg/dL, then 8 units. If blood glucose 341-380 mg/dL, then 9 units. If blood glucose is greater than 381 then 10 units.      insulin detemir 100 UNIT/ML injection   Commonly known as: LEVEMIR   Inject 10 Units into the skin 2 (two) times daily.      levothyroxine 100 MCG tablet   Commonly known as: SYNTHROID, LEVOTHROID   Take 100 mcg by mouth daily.      Tramadol 50 MG tablet   Commonly known as: Ultram   Take 1-2 tablets (5-10 mg total) by mouth every 4 (four) hours as needed for pain.      pantoprazole 40 MG tablet   Commonly known as: PROTONIX   Take  1 tablet (40 mg total) by mouth 2 (two) times daily before a meal.      potassium chloride SA 20 MEQ tablet   Commonly known as: K-DUR,KLOR-CON   Take 1 tablet (20 mEq total) by mouth daily for 3 days then stop. Take 0.5 tablet (10 meq) by mouth only when takes Lasix 20 mg.      rosuvastatin 40 MG tablet   Commonly known as: CRESTOR   Take 1 tablet (40 mg total) by mouth daily at 6 PM.            Follow Up Appointments: Follow-up Information    Follow up with Glori Bickers, MD. (Call for a follow up appointment for 2 weeks)    Contact information:   [id number] [phone]       Follow up with Len Childs, MD. (PA/LAT CXR to be taken on 10/23/2011 at 12:00 pm;Appointment with Dr. Prescott Gum is on 10/23/2011 at 12:45 pm)    Contact information:   [place] [phone]       Follow up with [facility] A, MD. (Call for a follow up appoinment regarding HGA1C pre op 10.3)        Please note she was not discharged on an ACE or ARB as her SBP was barely in the low 100's.  Signed: Brook Geraci MPA-C 10/01/2011, 1:40 PM

## 2011-10-01 NOTE — Progress Notes (Addendum)
4 Days Post-Op Procedure(s) (LRB): CORONARY ARTERY BYPASS GRAFTING (CABG) (N/A)  Subjective: Patient feeling fairly well. Putting on her make up this am.  Objective: Vital signs in last 24 hours: Patient Vitals for the past 24 hrs:  BP Temp Temp src Pulse Resp SpO2 Weight  10/01/11 0540 108/54 mmHg 97.6 F (36.4 C) Oral 78  18  97 % -  10/01/11 0202 - - - - - - 164 lb 10.9 oz (74.7 kg)  09/30/11 2127 120/60 mmHg 98 F (36.7 C) Oral 76  18  96 % -  09/30/11 1408 117/53 mmHg 97.8 F (36.6 C) - 77  18  98 % -  09/30/11 1345 - - - - 29  - -  09/30/11 1300 110/68 mmHg - - - 30  - -  09/30/11 1200 110/50 mmHg 97.4 F (36.3 C) Oral - 29  98 % -  09/30/11 1100 102/53 mmHg - - - 23  - -  09/30/11 1000 - - - - 43  - -  09/30/11 0900 72/30 mmHg - - 72  25  99 % -  09/30/11 0838 - - - - - 98 % -   Pre op weight  72 kg Current Weight  10/01/11 164 lb 10.9 oz (74.7 kg)      Intake/Output from previous day: 01/21 0701 - 01/22 0700 In: 1085 [P.O.:1080; I.V.:3; IV Piggyback:2] Out: 401 [Urine:400; Stool:1]   Physical Exam:  Cardiovascular: RRR, no murmurs, gallops, or rubs. Pulmonary: Clear to auscultation bilaterally; no rales, wheezes, or rhonchi. Abdomen: Soft, non tender, bowel sounds present. Extremities: Mild bilateral lower extremity edema. Wounds: Clean and dry.  No erythema or signs of infection.  Lab Results: CBC: Basename 10/01/11 0710 09/30/11 0400  WBC 8.3 13.1*  HGB 10.7* 9.4*  HCT 32.5* 28.5*  PLT 243 195   BMET:  Basename 09/30/11 0400 09/29/11 0300  NA 135 136  K 3.5 3.7  CL 99 103  CO2 27 25  GLUCOSE 107* 103*  BUN 15 8  CREATININE 0.90 0.75  CALCIUM 8.5 9.1    PT/INR: No results found for this basename: LABPROT,INR in the last 72 hours ABG:  INR: Will add last result for INR, ABG once components are confirmed Will add last 4 CBG results once components are confirmed  Assessment/Plan:  1. CV - SR. Continue Coreg 3.125 bid, Digoxin 0.25 daily,  and Plavix 75 daily. 2.  Pulmonary - Encourage incentive spirometer.CXR this am shows trace right pleural effusion,  no pneumothorax. 3. Volume Overload - Continue with diuresis. 4.  Acute blood loss anemia - H/H increased to 10.7/32.5 5.DM-CBGs 144/92/114.Continue Insulin 10 bid and sliding scale.HGA1C 10.3 pre op. Will need follow up as outpatient. 6.Continue with CRPI. 7.Possibly discharge 1-2 days.  ZIMMERMAN,DONIELLE MPA-C 10/01/2011   doing well ambulating around 2000 Mental status much improved from early post op Home 1-2 days I have seen and examined Bridget Martinez Signs and agree with the above assessment  and plan.  Grace Isaac MD [facility] [phone] Office [phone] 10/01/2011 3:54 PM

## 2011-10-01 NOTE — Progress Notes (Signed)
Patient refused to ambulate in hallway due to family visiting. Asked to come back later. Bridget Martinez

## 2011-10-02 LAB — GLUCOSE, CAPILLARY
Glucose-Capillary: 146 mg/dL — ABNORMAL HIGH (ref 70–99)
Glucose-Capillary: 359 mg/dL — ABNORMAL HIGH (ref 70–99)

## 2011-10-02 MED ORDER — FUROSEMIDE 40 MG PO TABS
40.0000 mg | ORAL_TABLET | Freq: Every day | ORAL | Status: DC
  Administered 2011-10-02: 40 mg via ORAL
  Filled 2011-10-02: qty 1

## 2011-10-02 MED ORDER — POTASSIUM CHLORIDE CRYS ER 20 MEQ PO TBCR: 20.0000 meq | EXTENDED_RELEASE_TABLET | Freq: Every day | ORAL | Status: DC

## 2011-10-02 MED ORDER — POTASSIUM CHLORIDE CRYS ER 20 MEQ PO TBCR
20.0000 meq | EXTENDED_RELEASE_TABLET | Freq: Every day | ORAL | Status: DC
  Administered 2011-10-02: 20 meq via ORAL

## 2011-10-02 MED ORDER — FUROSEMIDE 40 MG PO TABS: 40.0000 mg | ORAL_TABLET | Freq: Every day | ORAL | Status: DC

## 2011-10-02 NOTE — Progress Notes (Signed)
   CARE MANAGEMENT NOTE 10/02/2011  Patient:  Bridget Martinez, Bridget Martinez   Account Number:  [id number]  Date Initiated:  09/30/2011  Documentation initiated by:  Luz Lex  Subjective/Objective Assessment:   Post Op CABG on 09-17-11.  Has Spouse     Action/Plan:   PTA, PT INDEPENDENT, LIVES WITH SPOUSE.   Anticipated DC Date:  10/02/2011   Anticipated DC Plan:  [place]  CM consult      [facility] Choice  HOME HEALTH   Choice offered to / List presented to:  C-1 Patient        [facility] arranged  HH-1 RN      [facility] agency  OTHER - SEE NOTE   Status of service:  Completed, signed off Medicare Important Message given?   (If response is "NO", the following Medicare IM given date fields will be blank) Date Medicare IM given:   Date Additional Medicare IM given:    Discharge Disposition:  [place]  Per UR Regulation:  Reviewed for med. necessity/level of care/duration of stay  Comments:  10/02/11 Yandriel Boening,RN,BSN 1200 MET WITH PT AND HUSBAND TO DISCUSS DC PLANS.  HUSBAND TO PROVIDE 24HR SUPERVISION AT DC.  REQUESTS HHRN FOR RESTORATIVE CARE--WOULD LIKE TO USE [facility] [place] [phone]).  REFERRAL FAXED TO [facility] AT [phone]. START OF CARE 24-48H POST DC DATE. Phone [phone]  09-30-11 2:30pm Luz Lex, RNBSN [phone] UR completed.

## 2011-10-02 NOTE — Progress Notes (Signed)
Subjective:   61 y/o woman with DM2 and CAD, CHF with iCM EF 20-25%. Underwent CABG 1/18.  Doing well. Ambulating unit slowly. Diuresing well on increased dose of lasix.  No AF or other arrhythmias.     Intake/Output Summary (Last 24 hours) at 10/02/11 0749 Last data filed at 10/01/11 1700  Gross per 24 hour  Intake    720 ml  Output    550 ml  Net    170 ml    Current meds:    . acetaminophen  1,000 mg Oral Q6H  . aspirin EC  81 mg Oral Daily  . bisacodyl  10 mg Oral Daily   Or  . bisacodyl  10 mg Rectal Daily  . carvedilol  3.125 mg Oral BID WC  . clopidogrel  75 mg Oral Q breakfast  . digoxin  250 mcg Oral Daily  . docusate sodium  200 mg Oral Daily  . escitalopram  10 mg Oral Daily  . furosemide  40 mg Oral BID  . insulin aspart  0-20 Units Subcutaneous TID WC  . insulin aspart  0-5 Units Subcutaneous QHS  . insulin glargine  10 Units Subcutaneous BID  . levothyroxine  100 mcg Oral QAC breakfast  . metolazone  5 mg Oral Daily  . pantoprazole  40 mg Oral Q1200  . potassium chloride  20 mEq Oral BID  . rosuvastatin  40 mg Oral q1800  . sodium chloride  3 mL Intravenous Q12H  . DISCONTD: furosemide  40 mg Intravenous BID  . DISCONTD: furosemide  40 mg Oral Daily  . DISCONTD: furosemide  40 mg Oral BID   Infusions:     Objective:  Blood pressure 104/65, pulse 77, temperature 98 F (36.7 C), temperature source Oral, resp. rate 19, height 5\' 3"  (1.6 m), weight 71.8 kg (158 lb 4.6 oz), SpO2 95.00%. Weight change: -2.9 kg (-6 lb 6.3 oz)  Physical Exam: General:  Well appearing. No resp difficulty HEENT: normal Neck: supple.  Carotids 2+ bilat; no bruits. No lymphadenopathy or thryomegaly appreciated. Cor: PMI nondisplaced. Regular rate & rhythm. No rubs, gallops or murmurs. CABG scar looks good Lungs: clear. Decreased at bases.  Abdomen: soft, nontender, nondistended. No hepatosplenomegaly. No bruits or masses. Good bowel sounds. Extremities: no cyanosis,  clubbing, rash 1+ edema Neuro: alert & orientedx3, cranial nerves grossly intact. moves all 4 extremities w/o difficulty. Affect pleasant  Telemetry:  Sinus  Lab Results: Basic Metabolic Panel:  Lab [id number] 0710 09/30/11 0400 09/29/11 0300 09/28/11 1619 09/28/11 1618 09/28/11 0415  NA 136 135 136 140 -- 138  K 4.2 3.5 -- -- -- --  CL 94* 99 103 102 -- 103  CO2 28 27 25  -- -- 23  GLUCOSE 155* 107* 103* 131* -- 172*  BUN 15 15 8  5* -- 7  CREATININE 0.89 0.90 0.75 0.80 0.75 --  CALCIUM 9.5 8.5 9.1 -- -- 8.5  MG -- -- -- -- 2.2 2.4  PHOS -- -- -- -- -- --   Liver Function Tests: No results found for this basename: AST:5,ALT:5,ALKPHOS:5,BILITOT:5,PROT:5,ALBUMIN:5 in the last 168 hours No results found for this basename: LIPASE:5,AMYLASE:5 in the last 168 hours No results found for this basename: AMMONIA:5 in the last 168 hours CBC:  Lab 10/01/11 0710 09/30/11 0400 09/29/11 0336 09/28/11 1619 09/28/11 1618 09/28/11 0415  WBC 8.3 13.1* 14.5* -- 13.1* 11.0*  NEUTROABS -- -- -- -- -- --  HGB 10.7* 9.4* 9.3* 10.5* 9.9* --  HCT 32.5*  28.5* 27.6* 31.0* 29.9* --  MCV 88.6 87.7 87.9 -- 87.7 87.2  PLT 243 195 164 -- 187 168   Cardiac Enzymes: No results found for this basename: CKTOTAL:5,CKMB:5,CKMBINDEX:5,TROPONINI:5 in the last 168 hours BNP: No components found with this basename: POCBNP:5 CBG:  Lab 10/02/11 0554 10/01/11 2034 10/01/11 1650 10/01/11 1130 10/01/11 0616  GLUCAP 146* 109* 139* 273* 114*   Microbiology: Lab Results  Component Value Date   CULT PROTEUS MIRABILIS 08/09/2011   No results found for this basename: CULT:2,SDES:2 in the last 168 hours  Imaging: Dg Chest 2 View  10/01/2011  *RADIOLOGY REPORT*  Clinical Data: Post CABG, hypertension, diabetes  CHEST - 2 VIEW  Comparison: 09/30/2011  Findings: Enlargement of cardiac silhouette post CABG. Minimal pulmonary vascular congestion. Interval removal of right jugular line. Residual subsegmental atelectasis left  lower lobe. Lungs otherwise clear. Minimal peribronchial thickening. Tiny bilateral pleural effusions. No pneumothorax. Bones demineralized. Epicardial pacing wires noted.  IMPRESSION: Enlargement of cardiac silhouette post CABG. Subsegmental atelectasis left lower lobe.  Original Report Authenticated By: Burnetta Sabin, M.D.     ASSESSMENT:  1. CAD s/p CABG 1/18 2. Chronic systolic HF EF 0000000 3.  DM2  PLAN/DISCUSSION:  Doing great. Post CABG. Stable for d/c from our standpoint. Please resume home meds. We will see in HF clinic in 1-2 weeks post d/c.    LOS: 5 days    Glori Bickers, MD 10/02/2011, 7:49 AM

## 2011-10-02 NOTE — Progress Notes (Signed)
Ed completed. Pt doing well. Requests her name be sent to [facility]. Sts she is seeing RD in outpt setting.  [id number] [place], ACSM

## 2011-10-02 NOTE — Progress Notes (Signed)
5 Days Post-Op Procedure(s) (LRB): CORONARY ARTERY BYPASS GRAFTING (CABG) (N/A)  Subjective: Patient without complaints and wants to go home.  Objective: Vital signs in last 24 hours: Patient Vitals for the past 24 hrs:  BP Temp Temp src Pulse Resp SpO2 Weight  10/02/11 0650 - - - - - - 158 lb 4.6 oz (71.8 kg)  10/02/11 0453 104/65 mmHg 98 F (36.7 C) Oral 77  19  95 % -  10/01/11 2016 95/61 mmHg 98 F (36.7 C) Oral 76  18  96 % -  10/01/11 1350 101/58 mmHg 97 F (36.1 C) Oral 79  18  93 % -   Pre op weight  72 kg Current Weight  10/02/11 158 lb 4.6 oz (71.8 kg)      Intake/Output from previous day: 01/22 0701 - 01/23 0700 In: 720 [P.O.:720] Out: 550 [Urine:550]   Physical Exam:  Cardiovascular: RRR, no murmurs, gallops, or rubs. Pulmonary: Clear to auscultation bilaterally; no rales, wheezes, or rhonchi. Abdomen: Soft, non tender, bowel sounds present. Extremities: Mild bilateral lower extremity edema. Wounds: Clean and dry.  No erythema or signs of infection.  Lab Results: CBC:  Basename 10/01/11 0710 09/30/11 0400  WBC 8.3 13.1*  HGB 10.7* 9.4*  HCT 32.5* 28.5*  PLT 243 195   BMET:   Basename 10/01/11 0710 09/30/11 0400  NA 136 135  K 4.2 3.5  CL 94* 99  CO2 28 27  GLUCOSE 155* 107*  BUN 15 15  CREATININE 0.89 0.90  CALCIUM 9.5 8.5    PT/INR: No results found for this basename: LABPROT,INR in the last 72 hours ABG:  INR: Will add last result for INR, ABG once components are confirmed Will add last 4 CBG results once components are confirmed  Assessment/Plan:  1. CV - SR. Continue Coreg 3.125 bid, Digoxin 0.25 daily, and Plavix 75 daily. 2.  Pulmonary - Encourage incentive spirometer. 3. Volume Overload - Continue with diuresis. 4.  Acute blood loss anemia - H/H increased to 10.7/32.5 5.DM-CBGs 139/109/146 .Continue Insulin 10 bid and sliding scale.HGA1C 10.3 pre op.Will need follow up as outpatient. 6.Continue with CRPI. 7.Discharge  home.  Emmalea Treanor MPA-C 10/02/2011

## 2011-10-02 NOTE — Progress Notes (Signed)
EPW discontinued per protocol. Tips intact. Patient tolerated well. Last INR INR/Prothrombin Time on   .  Patient advised Bedrest X 1 hour. Bridget Martinez

## 2011-10-02 NOTE — Progress Notes (Signed)
Home Health Nurse for restorative care from [facility]; they will call you prior to visit Phone: [phone]

## 2011-10-02 NOTE — Progress Notes (Signed)
Patient given discharge instructions and medication list. Patient verbalized understanding of instructions. Educated patient on heart failure symptoms and when to call md. Also discussed incisional care and restrictions. Vergie Living

## 2011-10-04 MED FILL — Electrolyte-R (PH 7.4) Solution: INTRAVENOUS | Qty: 6000 | Status: AC

## 2011-10-04 MED FILL — Heparin Sodium (Porcine) Inj 1000 Unit/ML: INTRAMUSCULAR | Qty: 40 | Status: AC

## 2011-10-04 MED FILL — Sodium Chloride Irrigation Soln 0.9%: Qty: 3000 | Status: AC

## 2011-10-04 MED FILL — Sodium Chloride IV Soln 0.9%: INTRAVENOUS | Qty: 1000 | Status: AC

## 2011-10-04 NOTE — Discharge Summary (Signed)
patient examined and medical record reviewed,agree with above note. VAN TRIGT III,PETER 10/04/2011

## 2011-10-07 ENCOUNTER — Other Ambulatory Visit: Payer: Self-pay

## 2011-10-07 ENCOUNTER — Emergency Department (HOSPITAL_COMMUNITY): Payer: BC Managed Care – PPO

## 2011-10-07 ENCOUNTER — Encounter (HOSPITAL_COMMUNITY): Payer: Self-pay | Admitting: Emergency Medicine

## 2011-10-07 ENCOUNTER — Telehealth: Payer: Self-pay | Admitting: Internal Medicine

## 2011-10-07 ENCOUNTER — Inpatient Hospital Stay (HOSPITAL_COMMUNITY)
Admission: EM | Admit: 2011-10-07 | Discharge: 2011-10-11 | DRG: 566 | Disposition: A | Discharge status: Home Health | Payer: BC Managed Care – PPO | Attending: Cardiovascular Disease | Admitting: Cardiovascular Disease

## 2011-10-07 DIAGNOSIS — K219 Gastro-esophageal reflux disease without esophagitis: Secondary | ICD-10-CM | POA: Diagnosis present

## 2011-10-07 DIAGNOSIS — E869 Volume depletion, unspecified: Principal | ICD-10-CM

## 2011-10-07 DIAGNOSIS — I2589 Other forms of chronic ischemic heart disease: Secondary | ICD-10-CM | POA: Diagnosis present

## 2011-10-07 DIAGNOSIS — I951 Orthostatic hypotension: Secondary | ICD-10-CM | POA: Diagnosis present

## 2011-10-07 DIAGNOSIS — I10 Essential (primary) hypertension: Secondary | ICD-10-CM | POA: Diagnosis present

## 2011-10-07 DIAGNOSIS — Z833 Family history of diabetes mellitus: Secondary | ICD-10-CM

## 2011-10-07 DIAGNOSIS — Z882 Allergy status to sulfonamides status: Secondary | ICD-10-CM

## 2011-10-07 DIAGNOSIS — R5381 Other malaise: Secondary | ICD-10-CM

## 2011-10-07 DIAGNOSIS — Z7982 Long term (current) use of aspirin: Secondary | ICD-10-CM

## 2011-10-07 DIAGNOSIS — N390 Urinary tract infection, site not specified: Secondary | ICD-10-CM | POA: Diagnosis present

## 2011-10-07 DIAGNOSIS — I5022 Chronic systolic (congestive) heart failure: Secondary | ICD-10-CM

## 2011-10-07 DIAGNOSIS — Z951 Presence of aortocoronary bypass graft: Secondary | ICD-10-CM

## 2011-10-07 DIAGNOSIS — R5383 Other fatigue: Secondary | ICD-10-CM

## 2011-10-07 DIAGNOSIS — E860 Dehydration: Secondary | ICD-10-CM | POA: Diagnosis present

## 2011-10-07 DIAGNOSIS — E876 Hypokalemia: Secondary | ICD-10-CM | POA: Diagnosis present

## 2011-10-07 DIAGNOSIS — I252 Old myocardial infarction: Secondary | ICD-10-CM

## 2011-10-07 DIAGNOSIS — I509 Heart failure, unspecified: Secondary | ICD-10-CM | POA: Diagnosis present

## 2011-10-07 DIAGNOSIS — N179 Acute kidney failure, unspecified: Secondary | ICD-10-CM

## 2011-10-07 DIAGNOSIS — Z794 Long term (current) use of insulin: Secondary | ICD-10-CM

## 2011-10-07 DIAGNOSIS — Z9181 History of falling: Secondary | ICD-10-CM

## 2011-10-07 DIAGNOSIS — R531 Weakness: Secondary | ICD-10-CM

## 2011-10-07 DIAGNOSIS — I2510 Atherosclerotic heart disease of native coronary artery without angina pectoris: Secondary | ICD-10-CM | POA: Diagnosis present

## 2011-10-07 DIAGNOSIS — E119 Type 2 diabetes mellitus without complications: Secondary | ICD-10-CM | POA: Diagnosis present

## 2011-10-07 DIAGNOSIS — E039 Hypothyroidism, unspecified: Secondary | ICD-10-CM | POA: Diagnosis present

## 2011-10-07 DIAGNOSIS — Z8249 Family history of ischemic heart disease and other diseases of the circulatory system: Secondary | ICD-10-CM

## 2011-10-07 LAB — COMPREHENSIVE METABOLIC PANEL
ALT: 15 U/L (ref 0–35)
AST: 31 U/L (ref 0–37)
CO2: 27 mEq/L (ref 19–32)
Calcium: 9.8 mg/dL (ref 8.4–10.5)
Chloride: 83 mEq/L — ABNORMAL LOW (ref 96–112)
GFR calc Af Amer: 56 mL/min — ABNORMAL LOW (ref >90)
GFR calc non Af Amer: 49 mL/min — ABNORMAL LOW (ref >90)
Glucose, Bld: 82 mg/dL (ref 70–99)
Sodium: 129 mEq/L — ABNORMAL LOW (ref 135–145)
Total Bilirubin: 0.8 mg/dL (ref 0.3–1.2)

## 2011-10-07 LAB — DIFFERENTIAL
Eosinophils Relative: 1 % (ref 0–5)
Lymphocytes Relative: 11 % — ABNORMAL LOW (ref 12–46)
Lymphs Abs: 1.3 10*3/uL (ref 0.7–4.0)
Monocytes Absolute: 0.9 10*3/uL (ref 0.1–1.0)
Neutro Abs: 9.7 10*3/uL — ABNORMAL HIGH (ref 1.7–7.7)

## 2011-10-07 LAB — CBC
HCT: 34.5 % — ABNORMAL LOW (ref 36.0–46.0)
MCV: 86.7 fL (ref 78.0–100.0)
Platelets: 484 10*3/uL — ABNORMAL HIGH (ref 150–400)
RBC: 3.98 MIL/uL (ref 3.87–5.11)
WBC: 12 10*3/uL — ABNORMAL HIGH (ref 4.0–10.5)

## 2011-10-07 MED ORDER — CARVEDILOL 3.125 MG PO TABS
3.1250 mg | ORAL_TABLET | Freq: Two times a day (BID) | ORAL | Status: DC
  Administered 2011-10-08 – 2011-10-11 (×7): 3.125 mg via ORAL
  Filled 2011-10-07 (×9): qty 1

## 2011-10-07 MED ORDER — LEVOTHYROXINE SODIUM 100 MCG PO TABS
100.0000 ug | ORAL_TABLET | Freq: Every day | ORAL | Status: DC
  Administered 2011-10-08 – 2011-10-11 (×4): 100 ug via ORAL
  Filled 2011-10-07 (×5): qty 1

## 2011-10-07 MED ORDER — SODIUM CHLORIDE 0.9 % IV SOLN
Freq: Once | INTRAVENOUS | Status: AC
  Administered 2011-10-07: 20 mL/h via INTRAVENOUS

## 2011-10-07 MED ORDER — ONDANSETRON HCL 4 MG/2ML IJ SOLN
4.0000 mg | Freq: Four times a day (QID) | INTRAMUSCULAR | Status: DC | PRN
  Administered 2011-10-07: 4 mg via INTRAVENOUS
  Filled 2011-10-07: qty 2

## 2011-10-07 MED ORDER — ESCITALOPRAM OXALATE 10 MG PO TABS
10.0000 mg | ORAL_TABLET | Freq: Every day | ORAL | Status: DC
  Administered 2011-10-08 – 2011-10-11 (×4): 10 mg via ORAL
  Filled 2011-10-07 (×4): qty 1

## 2011-10-07 MED ORDER — CLOPIDOGREL BISULFATE 75 MG PO TABS
75.0000 mg | ORAL_TABLET | Freq: Every day | ORAL | Status: DC
  Administered 2011-10-08 – 2011-10-11 (×4): 75 mg via ORAL
  Filled 2011-10-07 (×5): qty 1

## 2011-10-07 MED ORDER — SODIUM CHLORIDE 0.9 % IV SOLN
INTRAVENOUS | Status: AC
  Administered 2011-10-07: 21:00:00 via INTRAVENOUS

## 2011-10-07 MED ORDER — POTASSIUM CHLORIDE CRYS ER 20 MEQ PO TBCR
40.0000 meq | EXTENDED_RELEASE_TABLET | Freq: Two times a day (BID) | ORAL | Status: AC
  Administered 2011-10-07 – 2011-10-08 (×2): 40 meq via ORAL
  Filled 2011-10-07 (×2): qty 2

## 2011-10-07 MED ORDER — ONDANSETRON HCL 4 MG/2ML IJ SOLN
4.0000 mg | Freq: Once | INTRAMUSCULAR | Status: AC
  Administered 2011-10-07: 4 mg via INTRAVENOUS

## 2011-10-07 MED ORDER — PANTOPRAZOLE SODIUM 40 MG PO TBEC
40.0000 mg | DELAYED_RELEASE_TABLET | Freq: Every day | ORAL | Status: DC
  Administered 2011-10-08 – 2011-10-11 (×4): 40 mg via ORAL
  Filled 2011-10-07 (×4): qty 1

## 2011-10-07 MED ORDER — INSULIN DETEMIR 100 UNIT/ML ~~LOC~~ SOLN
10.0000 [IU] | Freq: Two times a day (BID) | SUBCUTANEOUS | Status: DC
  Administered 2011-10-07 – 2011-10-11 (×8): 10 [IU] via SUBCUTANEOUS
  Filled 2011-10-07: qty 3

## 2011-10-07 MED ORDER — ASPIRIN 81 MG PO CHEW
81.0000 mg | CHEWABLE_TABLET | Freq: Every day | ORAL | Status: DC
  Administered 2011-10-08 – 2011-10-11 (×4): 81 mg via ORAL
  Filled 2011-10-07 (×4): qty 1

## 2011-10-07 MED ORDER — ONDANSETRON HCL 4 MG/2ML IJ SOLN
INTRAMUSCULAR | Status: AC
  Filled 2011-10-07: qty 2

## 2011-10-07 MED ORDER — ACETAMINOPHEN 325 MG PO TABS
650.0000 mg | ORAL_TABLET | ORAL | Status: DC | PRN
Start: 2011-10-07 — End: 2011-10-11

## 2011-10-07 MED ORDER — SODIUM CHLORIDE 0.9 % IJ SOLN
3.0000 mL | Freq: Two times a day (BID) | INTRAMUSCULAR | Status: DC
  Administered 2011-10-08 – 2011-10-11 (×6): 3 mL via INTRAVENOUS

## 2011-10-07 MED ORDER — ROSUVASTATIN CALCIUM 40 MG PO TABS
40.0000 mg | ORAL_TABLET | Freq: Every day | ORAL | Status: DC
  Administered 2011-10-08 – 2011-10-10 (×3): 40 mg via ORAL
  Filled 2011-10-07 (×4): qty 1

## 2011-10-07 NOTE — ED Notes (Signed)
Family leaving to go home. Husband would like to be notified when patient gets a room.  Mable Fill" cell # [phone]

## 2011-10-07 NOTE — Telephone Encounter (Signed)
N/A.  LMTC. 

## 2011-10-07 NOTE — ED Notes (Signed)
Phoned husband to inform him of patient's admission.

## 2011-10-07 NOTE — H&P (Signed)
Bridget Martinez is an 61 y.o. female.   Chief Complaint: Weakness, dehydration  HPI: Bridget Martinez Seen is a 61 year old female with a history of congestive heart failure and coronary artery disease. She's 10 days out from being discharged from the hospital following coronary artery bypass grafting CORONARY ARTERY BYPASS GRAFTING (CABG)x4 (LIMA to LAD, SVG to Diagonal,SVG to Circumflex, and SVG to RCA) Coronary endarerectomy of circumflex.    She was discharged on the higher dose of Lasix than she had been on.  She's had a very poor by mouth intake and has not been eating much at all. She's had nausea and vomiting for the past several days. She has come weaker and weaker to the point where she now presents to the emergency room for followup evaluation and management.  She denies any chest pain or shortness of breath. She is having the usual manner chest soreness from her bypass grafting surgery.  Medications Prior to Admission  Medication Dose Route Frequency Provider Last Rate Last Dose  . 0.9 %  sodium chloride infusion   Intravenous Once Maudry Diego, MD 20 mL/hr at 10/07/11 1540 20 mL/hr at 10/07/11 1540  . 0.9 %  sodium chloride infusion   Intravenous Continuous Darden Amber., MD      . ondansetron [facility]) injection 4 mg  4 mg Intravenous Once Maudry Diego, MD   4 mg at 10/07/11 1554  . potassium chloride SA (K-DUR,KLOR-CON) CR tablet 40 mEq  40 mEq Oral BID Darden Amber., MD       Medications Prior to Admission  Medication Sig Dispense Refill  . aspirin 325 MG tablet Take 325 mg by mouth daily.        . carvedilol (COREG) 6.25 MG tablet Take 3.125 mg by mouth 2 (two) times daily with a meal.      . clopidogrel (PLAVIX) 75 MG tablet Take 75 mg by mouth daily with breakfast.      . digoxin (LANOXIN) 0.25 MG tablet Take 1 tablet (250 mcg total) by mouth daily.  30 tablet  6  . escitalopram (LEXAPRO) 10 MG tablet Take 10 mg by mouth daily.        . insulin aspart (NOVOLOG) 100  UNIT/ML injection Inject 5-10 Units into the skin 3 (three) times daily with meals. If blood glucose 70-140 mg/dL, then 3 units.  If blood glucose 141-180 mg/dL, then 4 units. If blood glucose 181-220 mg/dL, then 5 units. If blood glucose 221-260 mg/dL, then 6 units. If blood glucose 261-300 mg/dL, then 7 units. If blood glucose 301-340 mg/dL, then 8 units. If blood glucose 341-380 mg/dL, then 9 units. If blood glucose is greater than 381 then 10 units.       . insulin detemir (LEVEMIR) 100 UNIT/ML injection Inject 10 Units into the skin 2 (two) times daily.       Marland Kitchen levothyroxine (SYNTHROID, LEVOTHROID) 100 MCG tablet Take 100 mcg by mouth daily.        . pantoprazole (PROTONIX) 40 MG tablet Take 40 mg by mouth at bedtime.      . rosuvastatin (CRESTOR) 40 MG tablet Take 40 mg by mouth every evening.        Allergies:  Allergies  Allergen Reactions  . Sulfa Antibiotics Anaphylaxis and Swelling    Past Medical History  Diagnosis Date  . Diabetes mellitus     on insulin, with h/o DKA   . HTN (hypertension)   . Hyperlipidemia   . Venous stasis  ulcers   . Q waves suggestive of previous myocardial infarction     Pt denies h/o AMI, cath, PCI  . Angina   . Hypothyroidism   . GERD (gastroesophageal reflux disease)   . Arthritis   . Anxiety   . Depression   . Myocardial infarction     MILD MI  IN 11-12  . Coronary artery disease   . Heart murmur     Dr Jake Seats cardiologist    Past Surgical History  Procedure Date  . Cardiac catheterization   . Tonsillectomy   . Coronary artery bypass graft 09-27-11    CABGx4, PVT  . Coronary artery bypass graft 09/27/2011    Procedure: CORONARY ARTERY BYPASS GRAFTING (CABG);  Surgeon: Tharon Aquas Adelene Idler, MD;  Location: [place];  Service: Open Heart Surgery;  Laterality: N/A;  times four, on pump, using left internal mammary artery and right greater saphenous vein with coronary endarterectomy.    Family History  Problem Relation Age of Onset  . Heart  disease Father   . Hypertension Mother   . Multiple sclerosis Father   . Hyperthyroidism Mother   . Diabetes Cousin     Multiple maternal cousins with type 2 diabetes mellitus  . Diabetes Maternal Uncle     Type 1 diabetes mellitus   Social History:  reports that she has never smoked. She does not have any smokeless tobacco history on file. She reports that she does not drink alcohol or use illicit drugs.  Results for orders placed during the hospital encounter of 10/07/11 (from the past 48 hour(s))  CBC     Status: Abnormal   Collection Time   10/07/11  3:28 PM      Component Value Range Comment   WBC 12.0 (*) 4.0 - 10.5 (K/uL)    RBC 3.98  3.87 - 5.11 (MIL/uL)    Hemoglobin 11.7 (*) 12.0 - 15.0 (g/dL)    HCT 34.5 (*) 36.0 - 46.0 (%)    MCV 86.7  78.0 - 100.0 (fL)    MCH 29.4  26.0 - 34.0 (pg)    MCHC 33.9  30.0 - 36.0 (g/dL)    RDW 15.0  11.5 - 15.5 (%)    Platelets 484 (*) 150 - 400 (K/uL)   DIFFERENTIAL     Status: Abnormal   Collection Time   10/07/11  3:28 PM      Component Value Range Comment   Neutrophils Relative 81 (*) 43 - 77 (%)    Neutro Abs 9.7 (*) 1.7 - 7.7 (K/uL)    Lymphocytes Relative 11 (*) 12 - 46 (%)    Lymphs Abs 1.3  0.7 - 4.0 (K/uL)    Monocytes Relative 8  3 - 12 (%)    Monocytes Absolute 0.9  0.1 - 1.0 (K/uL)    Eosinophils Relative 1  0 - 5 (%)    Eosinophils Absolute 0.1  0.0 - 0.7 (K/uL)    Basophils Relative 0  0 - 1 (%)    Basophils Absolute 0.0  0.0 - 0.1 (K/uL)   COMPREHENSIVE METABOLIC PANEL     Status: Abnormal   Collection Time   10/07/11  3:28 PM      Component Value Range Comment   Sodium 129 (*) 135 - 145 (mEq/L)    Potassium 2.8 (*) 3.5 - 5.1 (mEq/L)    Chloride 83 (*) 96 - 112 (mEq/L)    CO2 27  19 - 32 (mEq/L)    Glucose, Bld  82  70 - 99 (mg/dL)    BUN 20  6 - 23 (mg/dL)    Creatinine, Ser 1.18 (*) 0.50 - 1.10 (mg/dL)    Calcium 9.8  8.4 - 10.5 (mg/dL)    Total Protein 7.5  6.0 - 8.3 (g/dL)    Albumin 3.2 (*) 3.5 - 5.2 (g/dL)     AST 31  0 - 37 (U/L)    ALT 15  0 - 35 (U/L)    Alkaline Phosphatase 168 (*) 39 - 117 (U/L)    Total Bilirubin 0.8  0.3 - 1.2 (mg/dL)    GFR calc non Af Amer 49 (*) >90 (mL/min)    GFR calc Af Amer 56 (*) >90 (mL/min)   DIGOXIN LEVEL     Status: Abnormal   Collection Time   10/07/11  3:28 PM      Component Value Range Comment   Digoxin Level 2.2 (*) 0.8 - 2.0 (ng/mL)    Dg Chest Portable 1 View  10/07/2011  *RADIOLOGY REPORT*  Clinical Data: Weakness.  Near syncope.  PORTABLE CHEST - 1 VIEW  Comparison: 10/01/2011.  Findings: Unchanged mild cardiomegaly.  Postoperative changes of CABG.  Lingular scarring and pleural reaction are present.  There is no pleural effusion.  No airspace disease or effusion. Monitoring leads are projected over the chest.  IMPRESSION: No acute cardiopulmonary disease.  CABG and mild enlargement of the heart.  Original Report Authenticated By: Dereck Ligas, M.D.    Review of systems was reviewed. All systems are negative except for as noted in the history of present illness.  Blood pressure 108/39, pulse 90, temperature 97.8 F (36.6 C), temperature source Oral, resp. rate 19, height 5\' 3"  (1.6 m), weight 158 lb (71.668 kg), SpO2 97.00%. The patient is alert and oriented x 3.  The mood and affect are normal.   Skin: warm and dry.  Color is normal.    HEENT:   the sclera are nonicteric.  The mucous membranes are moist.  The carotids are 2+ without bruits.  There is no thyromegaly.  There is no JVD.    Lungs: Lungs are clear bilaterally. Her chest wall is nontender.   Heart: Her sternotomy is well healed. It is clean and dry. regular rate with a normal S1 and S2.  There are no murmurs, gallops, or rubs. The PMI is not displaced.     Abdomen: good bowel sounds.  There is no guarding or rebound.  There is no hepatosplenomegaly or tenderness.  There are no masses.   Extremities:  no clubbing, cyanosis, or edema.  The legs are without rashes.  The distal pulses  are intact. Her skin turgor is decreased.  Neuro:  Cranial nerves II - XII are intact.  Motor and sensory functions are intact.    Lab Results  Component Value Date   WBC 12.0* 10/07/2011   HGB 11.7* 10/07/2011   HCT 34.5* 10/07/2011   MCV 86.7 10/07/2011   PLT 484* 10/07/2011   Lab Results  Component Value Date   CREATININE 1.18* 10/07/2011   BUN 20 10/07/2011   NA 129* 10/07/2011   K 2.8* 10/07/2011   CL 83* 10/07/2011   CO2 27 10/07/2011   EKG reveals sinus rhythm. She has frequent premature ventricular contractions. 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  There is an old inferior wall myocardial infarction     Assessment/Plan  1. Volume depletion: The patient clearly is on depleted. Her sodium, chloride and potassium levels are  quite low.  Her BUN and creatinine are higher than her discharge creatinine several days ago.  Has decreased skin turgor. I think this is due to the Lasix combined with the fact that she has not really been eating or drinking very well.   She's already feeling a little bit better here in the emergency room. I offered to rehydrate her and send her home but she wants to say and be watched for a couple days. I think that she's scared to go home so she's been feeling so sick.  We'll gently hydrate her. We'll start with 1 L of IV fluids in the next 10 hours. Will replace her potassium. We will hold her Lasix and potassium for now.  2. Coronary artery disease: Stable  3. Congestive heart failure: This is currently stable. We'll watch her very closely.  Her ejection fraction is between 25-30%.  Her digoxin level is elevated. This is likely because of her worsening renal function.  We'll hold her digoxin for now.  Darden Amber. 10/07/2011, 7:17 PM

## 2011-10-07 NOTE — ED Provider Notes (Addendum)
History     CSN: [id number]  Arrival date & time 10/07/11  1444   First MD Initiated Contact with Patient 10/07/11 1510      Chief Complaint  Patient presents with  . Nausea  . Emesis  . Dizziness    (Consider location/radiation/quality/duration/timing/severity/associated sxs/prior treatment) Patient is a 61 y.o. female presenting with weakness. The history is provided by the patient (She states that she has been weak and dizzy for couple days she's 10 days postop day cardiac thoracic surgery patient states she has fallen 3 times in my note on her buttocks she has no entries in the fall but she feels as when she stands up and). No language interpreter was used.  Weakness The primary symptoms include dizziness. Primary symptoms do not include headaches, syncope, loss of consciousness, seizures or paresthesias. The symptoms began 2 days ago. The symptoms are improving. The neurological symptoms are diffuse. The symptoms occurred while straining.  Dizziness also occurs with weakness.  Additional symptoms include weakness. Additional symptoms do not include neck stiffness or hallucinations. Medical issues do not include seizures. Workup history does not include MRI.    Past Medical History  Diagnosis Date  . Diabetes mellitus     on insulin, with h/o DKA   . HTN (hypertension)   . Hyperlipidemia   . Venous stasis ulcers   . Q waves suggestive of previous myocardial infarction     Pt denies h/o AMI, cath, PCI  . Angina   . Hypothyroidism   . GERD (gastroesophageal reflux disease)   . Arthritis   . Anxiety   . Depression   . Myocardial infarction     MILD MI  IN 11-12  . Coronary artery disease   . Heart murmur     Dr Jake Seats cardiologist    Past Surgical History  Procedure Date  . Cardiac catheterization   . Tonsillectomy   . Coronary artery bypass graft 09-27-11    CABGx4, PVT  . Coronary artery bypass graft 09/27/2011    Procedure: CORONARY ARTERY BYPASS GRAFTING  (CABG);  Surgeon: Tharon Aquas Adelene Idler, MD;  Location: [place];  Service: Open Heart Surgery;  Laterality: N/A;  times four, on pump, using left internal mammary artery and right greater saphenous vein with coronary endarterectomy.    Family History  Problem Relation Age of Onset  . Heart disease Father   . Hypertension Mother   . Multiple sclerosis Father   . Hyperthyroidism Mother   . Diabetes Cousin     Multiple maternal cousins with type 2 diabetes mellitus  . Diabetes Maternal Uncle     Type 1 diabetes mellitus    History  Substance Use Topics  . Smoking status: Never Smoker   . Smokeless tobacco: Not on file  . Alcohol Use: No    OB History    Grav Para Term Preterm Abortions TAB SAB Ect Mult Living                  Review of Systems  Constitutional: Negative for fatigue.  HENT: Negative for congestion, neck stiffness, sinus pressure and ear discharge.   Eyes: Negative for discharge.  Respiratory: Negative for cough.   Cardiovascular: Negative for chest pain and syncope.  Gastrointestinal: Negative for abdominal pain and diarrhea.  Genitourinary: Negative for frequency and hematuria.  Musculoskeletal: Negative for back pain.  Skin: Negative for rash.  Neurological: Positive for dizziness and weakness. Negative for seizures, loss of consciousness, headaches and paresthesias.  Hematological:  Negative.   Psychiatric/Behavioral: Negative for hallucinations.    Allergies  Sulfa antibiotics  Home Medications   Current Outpatient Rx  Name Route Sig Dispense Refill  . ASPIRIN 325 MG PO TABS Oral Take 325 mg by mouth daily.      Marland Kitchen CARVEDILOL 6.25 MG PO TABS Oral Take 3.125 mg by mouth 2 (two) times daily with a meal.    . CLOPIDOGREL BISULFATE 75 MG PO TABS Oral Take 75 mg by mouth daily with breakfast.    . DIGOXIN 0.25 MG PO TABS Oral Take 1 tablet (250 mcg total) by mouth daily. 30 tablet 6  . ESCITALOPRAM OXALATE 10 MG PO TABS Oral Take 10 mg by mouth daily.        . INSULIN ASPART 100 UNIT/ML [place] SOLN Subcutaneous Inject 5-10 Units into the skin 3 (three) times daily with meals. If blood glucose 70-140 mg/dL, then 3 units.  If blood glucose 141-180 mg/dL, then 4 units. If blood glucose 181-220 mg/dL, then 5 units. If blood glucose 221-260 mg/dL, then 6 units. If blood glucose 261-300 mg/dL, then 7 units. If blood glucose 301-340 mg/dL, then 8 units. If blood glucose 341-380 mg/dL, then 9 units. If blood glucose is greater than 381 then 10 units.     . INSULIN DETEMIR 100 UNIT/ML [place] SOLN Subcutaneous Inject 10 Units into the skin 2 (two) times daily.     Marland Kitchen LEVOTHYROXINE SODIUM 100 MCG PO TABS Oral Take 100 mcg by mouth daily.      Marland Kitchen PANTOPRAZOLE SODIUM 40 MG PO TBEC Oral Take 40 mg by mouth at bedtime.    Marland Kitchen ROSUVASTATIN CALCIUM 40 MG PO TABS Oral Take 40 mg by mouth every evening.      BP 108/39  Pulse 90  Temp(Src) 97.8 F (36.6 C) (Oral)  Resp 19  Ht 5\' 3"  (1.6 m)  Wt 158 lb (71.668 kg)  BMI 27.99 kg/m2  SpO2 97%  Physical Exam  Constitutional: She is oriented to person, place, and time. She appears well-developed.  HENT:  Head: Normocephalic and atraumatic.  Eyes: Conjunctivae and EOM are normal. No scleral icterus.  Neck: Neck supple. No thyromegaly present.  Cardiovascular: Normal rate and regular rhythm.  Exam reveals no gallop and no friction rub.   No murmur heard. Pulmonary/Chest: No stridor. She has no wheezes. She has no rales. She exhibits no tenderness.  Abdominal: She exhibits no distension. There is no tenderness. There is no rebound.  Musculoskeletal: Normal range of motion. She exhibits no edema.  Lymphadenopathy:    She has no cervical adenopathy.  Neurological: She is oriented to person, place, and time. Coordination normal.  Skin: No rash noted. No erythema.  Psychiatric: She has a normal mood and affect. Her behavior is normal.    ED Course  Procedures (including critical care time)  Labs Reviewed  CBC - Abnormal;  Notable for the following:    WBC 12.0 (*)    Hemoglobin 11.7 (*)    HCT 34.5 (*)    Platelets 484 (*)    All other components within normal limits  DIFFERENTIAL - Abnormal; Notable for the following:    Neutrophils Relative 81 (*)    Neutro Abs 9.7 (*)    Lymphocytes Relative 11 (*)    All other components within normal limits  COMPREHENSIVE METABOLIC PANEL - Abnormal; Notable for the following:    Sodium 129 (*)    Potassium 2.8 (*)    Chloride 83 (*)  Creatinine, Ser 1.18 (*)    Albumin 3.2 (*)    Alkaline Phosphatase 168 (*)    GFR calc non Af Amer 49 (*)    GFR calc Af Amer 56 (*)    All other components within normal limits  DIGOXIN LEVEL - Abnormal; Notable for the following:    Digoxin Level 2.2 (*)    All other components within normal limits   Dg Chest Portable 1 View  10/07/2011  *RADIOLOGY REPORT*  Clinical Data: Weakness.  Near syncope.  PORTABLE CHEST - 1 VIEW  Comparison: 10/01/2011.  Findings: Unchanged mild cardiomegaly.  Postoperative changes of CABG.  Lingular scarring and pleural reaction are present.  There is no pleural effusion.  No airspace disease or effusion. Monitoring leads are projected over the chest.  IMPRESSION: No acute cardiopulmonary disease.  CABG and mild enlargement of the heart.  Original Report Authenticated By: Dereck Ligas, M.D.   Pt became very dizzy when we stood her up  No diagnosis found.   Date: 10/07/2011  Rate:82  Rhythm: normal sinus rhythm  trigemity  QRS Axis: left  Intervals: normal  ST/T Wave abnormalities: nonspecific ST changes  Conduction Disutrbances:none  Narrative Interpretation:   Old EKG Reviewed: none available    MDM  dehydation        Maudry Diego, MD 10/07/11 [place], MD 10/07/11 [phone]

## 2011-10-07 NOTE — ED Notes (Signed)
Spoke with Dr. Orlie Dakin of [facility] cardiology. Informed him of pt BP. Pt aaox3. resp e/u. Nad. All other vitals WNL. No new orders given at this time. Will continue to monitor.

## 2011-10-07 NOTE — Telephone Encounter (Signed)
New problem Pt's son called and said she has not been able to eat much since surgery and is vomiting up blood please call back

## 2011-10-07 NOTE — ED Notes (Signed)
Pt undressed, in gown, on monitor, continuous pulse oximetry and blood pressure cuff; family at bedside 

## 2011-10-07 NOTE — ED Notes (Signed)
Per ems- pt was dc from here last Wednesday for a quadruple bypass. Upon being home pt has been having nausea, vomiting,dizziness and difficulty walking.   Pt states she is not having any pain, but "not feeling good"

## 2011-10-08 DIAGNOSIS — E869 Volume depletion, unspecified: Principal | ICD-10-CM

## 2011-10-08 DIAGNOSIS — I2510 Atherosclerotic heart disease of native coronary artery without angina pectoris: Secondary | ICD-10-CM

## 2011-10-08 LAB — URINALYSIS, ROUTINE W REFLEX MICROSCOPIC
Glucose, UA: 250 mg/dL — AB
Specific Gravity, Urine: 1.01 (ref 1.005–1.030)
pH: 7.5 (ref 5.0–8.0)

## 2011-10-08 LAB — COMPREHENSIVE METABOLIC PANEL
ALT: 14 U/L (ref 0–35)
AST: 31 U/L (ref 0–37)
Albumin: 2.9 g/dL — ABNORMAL LOW (ref 3.5–5.2)
Alkaline Phosphatase: 142 U/L — ABNORMAL HIGH (ref 39–117)
Chloride: 90 mEq/L — ABNORMAL LOW (ref 96–112)
Potassium: 3.2 mEq/L — ABNORMAL LOW (ref 3.5–5.1)
Sodium: 131 mEq/L — ABNORMAL LOW (ref 135–145)
Total Bilirubin: 0.6 mg/dL (ref 0.3–1.2)
Total Protein: 6.2 g/dL (ref 6.0–8.3)

## 2011-10-08 LAB — GLUCOSE, CAPILLARY
Glucose-Capillary: 137 mg/dL — ABNORMAL HIGH (ref 70–99)
Glucose-Capillary: 145 mg/dL — ABNORMAL HIGH (ref 70–99)

## 2011-10-08 LAB — URINE MICROSCOPIC-ADD ON

## 2011-10-08 LAB — CBC
HCT: 30.7 % — ABNORMAL LOW (ref 36.0–46.0)
MCH: 29.2 pg (ref 26.0–34.0)
MCV: 88 fL (ref 78.0–100.0)
Platelets: 413 10*3/uL — ABNORMAL HIGH (ref 150–400)
RDW: 15.1 % (ref 11.5–15.5)

## 2011-10-08 MED ORDER — INSULIN ASPART 100 UNIT/ML ~~LOC~~ SOLN
4.0000 [IU] | Freq: Three times a day (TID) | SUBCUTANEOUS | Status: DC
  Administered 2011-10-08 – 2011-10-11 (×7): 4 [IU] via SUBCUTANEOUS
  Filled 2011-10-08: qty 3

## 2011-10-08 MED ORDER — POTASSIUM CHLORIDE IN NACL 40-0.9 MEQ/L-% IV SOLN
INTRAVENOUS | Status: DC
  Administered 2011-10-08: 11:00:00 via INTRAVENOUS
  Filled 2011-10-08 (×4): qty 1000

## 2011-10-08 MED ORDER — POTASSIUM CHLORIDE CRYS ER 20 MEQ PO TBCR: 40.0000 meq | EXTENDED_RELEASE_TABLET | Freq: Once | ORAL | Status: DC

## 2011-10-08 MED ORDER — INSULIN ASPART 100 UNIT/ML ~~LOC~~ SOLN
0.0000 [IU] | Freq: Three times a day (TID) | SUBCUTANEOUS | Status: DC
  Administered 2011-10-08: 2 [IU] via SUBCUTANEOUS
  Administered 2011-10-08 – 2011-10-09 (×2): 5 [IU] via SUBCUTANEOUS
  Administered 2011-10-09: 3 [IU] via SUBCUTANEOUS
  Administered 2011-10-10: 5 [IU] via SUBCUTANEOUS
  Administered 2011-10-10: 2 [IU] via SUBCUTANEOUS
  Administered 2011-10-10: 5 [IU] via SUBCUTANEOUS
  Administered 2011-10-11: 11 [IU] via SUBCUTANEOUS
  Administered 2011-10-11: 8 [IU] via SUBCUTANEOUS
  Filled 2011-10-08 (×2): qty 3

## 2011-10-08 MED ORDER — SODIUM CHLORIDE 0.9 % IV BOLUS (SEPSIS)
1000.0000 mL | Freq: Once | INTRAVENOUS | Status: AC
  Administered 2011-10-08: 1000 mL via INTRAVENOUS

## 2011-10-08 MED ORDER — CIPROFLOXACIN HCL 500 MG PO TABS
500.0000 mg | ORAL_TABLET | Freq: Two times a day (BID) | ORAL | Status: DC
  Administered 2011-10-09 – 2011-10-11 (×6): 500 mg via ORAL
  Filled 2011-10-08 (×8): qty 1

## 2011-10-08 MED ORDER — POTASSIUM CHLORIDE CRYS ER 20 MEQ PO TBCR
40.0000 meq | EXTENDED_RELEASE_TABLET | Freq: Every day | ORAL | Status: DC
  Administered 2011-10-08: 40 meq via ORAL
  Filled 2011-10-08 (×2): qty 2

## 2011-10-08 NOTE — Telephone Encounter (Signed)
N/A.  Pt admitted to [facility].

## 2011-10-08 NOTE — Progress Notes (Signed)
This morning patient stated that she fells better then she felt yesterday. Last night patient was unable to give [place] a standing weight due to feeling nauseated and dizzy. This morning patient was able to stand and use bedside commode without any problems. Patient denies any dizziness and nausea at this time. Patient only complaint is weakness in lower extremities.

## 2011-10-08 NOTE — Progress Notes (Signed)
Subjective:  61 y/o woman with DM2 and CAD, CHF with iCM EF 20-25%. 09/27/11 Underwent CABG x4  And coronary endarterectomy to circumflex ( left internal mammary artery to left anterior descending, saphenous vein graft to diagonal, saphenous vein graft to obtuse marginal, saphenous vein graft to  right coronary artery).   Discharged from [place] 10/01/11. Discharge weight 164 . ( weight down 21 pounds). Followed by [facility] upon discharge. She started feeling bad Friday with nausea and dizziness. Fell 3 times at home. Home health nurse reported low blood pressure. Stopped Lasix on Saturday. Poor appetite with limited intake due to nausea. Returned to hospital yesterday due to weakness and dehydration. Continues to complain of ongoing dizziness. Digoxin stopped yesterday . Dig level 2.2    Denies SOB/PND/Orthopnea. Complains of incisional pain from CABG.       Intake/Output Summary (Last 24 hours) at 10/08/11 0809 Last data filed at 10/08/11 0618  Gross per 24 hour  Intake 1018.33 ml  Output    350 ml  Net 668.33 ml    Current meds:    . sodium chloride   Intravenous Once  . aspirin  81 mg Oral Daily  . carvedilol  3.125 mg Oral BID WC  . clopidogrel  75 mg Oral Q breakfast  . escitalopram  10 mg Oral Daily  . insulin detemir  10 Units Subcutaneous BID  . levothyroxine  100 mcg Oral QAC breakfast  . ondansetron (ZOFRAN) IV  4 mg Intravenous Once  . pantoprazole  40 mg Oral Q1200  . potassium chloride  40 mEq Oral BID  . rosuvastatin  40 mg Oral q1800  . sodium chloride  3 mL Intravenous Q12H   Infusions:    . sodium chloride 100 mL/hr at 10/07/11 2119     Objective:  Blood pressure 109/67, pulse 92, temperature 98.5 F (36.9 C), temperature source Oral, resp. rate 18, height 5\' 3"  (1.6 m), weight 65 kg (143 lb 4.8 oz), SpO2 96.00%. Weight change:   Physical Exam: General: Illl appearing. No resp difficulty HEENT: normal Neck: supple. JVP flat. Carotids 2+ bilat; no  bruits. No lymphadenopathy or thryomegaly appreciated. Cor: PMI nondisplaced. Regular rate & rhythm. No rubs, gallops or murmurs. Lungs: clear Abdomen: soft, nontender, nondistended. No hepatosplenomegaly. No bruits or masses. Good bowel sounds. Extremities: no cyanosis, clubbing, rash, edema Neuro: alert & orientedx3, cranial nerves grossly intact. moves all 4 extremities w/o difficulty. Affect pleasant Skin: Sternal incision approximated . No erythema No exudate.  Telemetry: Sinus Rhythm  Lab Results: Basic Metabolic Panel:  Lab [id number] 0540 10/07/11 1528  NA 131* 129*  K 3.2* 2.8*  CL 90* 83*  CO2 28 27  GLUCOSE 219* 82  BUN 19 20  CREATININE 1.27* 1.18*  CALCIUM 8.9 9.8  MG -- --  PHOS -- --   Liver Function Tests:  Lab 10/08/11 0540 10/07/11 1528  AST 31 31  ALT 14 15  ALKPHOS 142* 168*  BILITOT 0.6 0.8  PROT 6.2 7.5  ALBUMIN 2.9* 3.2*   No results found for this basename: LIPASE:5,AMYLASE:5 in the last 168 hours No results found for this basename: AMMONIA:5 in the last 168 hours CBC:  Lab 10/08/11 0540 10/07/11 1528  WBC 10.2 12.0*  NEUTROABS -- 9.7*  HGB 10.2* 11.7*  HCT 30.7* 34.5*  MCV 88.0 86.7  PLT 413* 484*   Cardiac Enzymes: No results found for this basename: CKTOTAL:5,CKMB:5,CKMBINDEX:5,TROPONINI:5 in the last 168 hours BNP: No components found with this basename: POCBNP:5  CBG:  Lab 10/08/11 0627 10/07/11 2353 10/02/11 1131 10/02/11 0554 10/01/11 2034  GLUCAP 216* 227* 359* 146* 109*   Microbiology: Lab Results  Component Value Date   CULT PROTEUS MIRABILIS 08/09/2011   No results found for this basename: CULT:2,SDES:2 in the last 168 hours  Imaging: Dg Chest Portable 1 View  10/07/2011  *RADIOLOGY REPORT*  Clinical Data: Weakness.  Near syncope.  PORTABLE CHEST - 1 VIEW  Comparison: 10/01/2011.  Findings: Unchanged mild cardiomegaly.  Postoperative changes of CABG.  Lingular scarring and pleural reaction are present.  There is no  pleural effusion.  No airspace disease or effusion. Monitoring leads are projected over the chest.  IMPRESSION: No acute cardiopulmonary disease.  CABG and mild enlargement of the heart.  Original Report Authenticated By: Dereck Ligas, M.D.     ASSESSMENT:  1. Dehydration 2. CAD s/p CABG 1/18  3. Chronic systolic HF EF 0000000  4.. DM2  5. Hypokalemia 6. Hypothyroidism      PLAN/DISCUSSION: Volume status low. Weight is down 21 pounds since discharge. Ongoing nausea/dizziness. Continue re-hydration. Supplement potassium. Add insulin sliding scale coverage. SCDs in place for DVT prophylaxis. Will check UA.    LOS: 1 day    CLEGG,AMY, NP 10/08/2011, 8:09 AM  Patient seen and examined with Darrick Grinder, NP. We discussed all aspects of the encounter. I agree with the assessment and plan as stated above. She appears significantly volume depleted and weak. Will bolus with 1L NS and continue maintenance IVFs for at least 24 hours. Will have PT see starting tomorrow.   Daniel [name],MD 9:28 AM

## 2011-10-08 NOTE — Progress Notes (Signed)
Utilization review complete 

## 2011-10-09 DIAGNOSIS — I2510 Atherosclerotic heart disease of native coronary artery without angina pectoris: Secondary | ICD-10-CM

## 2011-10-09 LAB — GLUCOSE, CAPILLARY
Glucose-Capillary: 246 mg/dL — ABNORMAL HIGH (ref 70–99)
Glucose-Capillary: 154 mg/dL — ABNORMAL HIGH (ref 70–99)

## 2011-10-09 LAB — BASIC METABOLIC PANEL
BUN: 13 mg/dL (ref 6–23)
Calcium: 9.3 mg/dL (ref 8.4–10.5)
Creatinine, Ser: 1.13 mg/dL — ABNORMAL HIGH (ref 0.50–1.10)
GFR calc Af Amer: 60 mL/min — ABNORMAL LOW (ref >90)

## 2011-10-09 MED ORDER — POTASSIUM CHLORIDE IN NACL 40-0.9 MEQ/L-% IV SOLN
INTRAVENOUS | Status: AC
  Administered 2011-10-09: 16:00:00 via INTRAVENOUS
  Filled 2011-10-09: qty 1000

## 2011-10-09 NOTE — Progress Notes (Signed)
Nursing Note: Cosign all medications, assessment, pathway and patient education. Ange Puskas [employer] laboratory technician.

## 2011-10-09 NOTE — Progress Notes (Addendum)
Advanced Heart Failure Rounding Note   Subjective:    61 y/o woman with DM2 and CAD, CHF with iCM EF 20-25%. 09/27/11 Underwent CABG x4  and coronary endarterectomy to circumflex (LIMA to LAD, SVG to diag, SVG to OM and SVG to RCA)    Discharged from [facility] 10/01/11. Discharge weight 164 . ( weight down 21 pounds). Followed by [facility] upon discharge. She started feeling bad Friday with nausea and dizziness. Fell 3 times at home. Home health nurse reported low blood pressure. Stopped Lasix on Saturday. Poor appetite with limited intake due to nausea. Returned to hospital yesterday due to weakness and dehydration. Continues to complain of ongoing dizziness. Digoxin stopped yesterday . Dig level 2.2   Started on SSI yesterday.  UA showed ketones, leukocytes, cipro was added.    Received 3 L of IVF.  Nausea improved.  Dizziness much better. Denies SOB/PND/Orthopnea. Complains of incisional pain from CABG.  Is going to try to walk today.   LAbs pending.    Objective:    Vital Signs:   Temp:  [97.4 F (36.3 C)-98.5 F (36.9 C)] 97.4 F (36.3 C) (01/30 0454) Pulse Rate:  [78-91] 85  (01/30 0622) Resp:  [18-19] 18  (01/30 0454) BP: (92-106)/(54-65) 106/65 mmHg (01/30 0622) SpO2:  [94 %-100 %] 100 % (01/30 0454) Weight:  [68.765 kg (151 lb 9.6 oz)] 68.765 kg (151 lb 9.6 oz) (01/30 0454) Last BM Date: 10/06/11  24-hour weight change: Weight change: -2.903 kg (-6 lb 6.4 oz)  Intake/Output:   Intake/Output Summary (Last 24 hours) at 10/09/11 0905 Last data filed at 10/09/11 [id number]  Gross per 24 hour  Intake 2743.33 ml  Output    775 ml  Net 1968.33 ml     Physical Exam: General:  Well appearing. No resp difficulty HEENT: normal Neck: supple. JVP flat . Carotids 2+ bilat; no bruits. No lymphadenopathy or thryomegaly appreciated. Cor: PMI nondisplaced. Regular rate & rhythm. No rubs, gallops or murmurs. Lungs: clear Abdomen: soft, nontender, nondistended. No hepatosplenomegaly. No bruits  or masses. Good bowel sounds. Extremities: no cyanosis, clubbing, rash, edema Neuro: alert & orientedx3, cranial nerves grossly intact. moves all 4 extremities w/o difficulty. Affect pleasant  Telemetry: SR  Labs: Basic Metabolic Panel:  Lab [id number] 0540 10/07/11 1528  NA 131* 129*  K 3.2* 2.8*  CL 90* 83*  CO2 28 27  GLUCOSE 219* 82  BUN 19 20  CREATININE 1.27* 1.18*  CALCIUM 8.9 9.8  MG -- --  PHOS -- --    Liver Function Tests:  Lab 10/08/11 0540 10/07/11 1528  AST 31 31  ALT 14 15  ALKPHOS 142* 168*  BILITOT 0.6 0.8  PROT 6.2 7.5  ALBUMIN 2.9* 3.2*   No results found for this basename: LIPASE:5,AMYLASE:5 in the last 168 hours No results found for this basename: AMMONIA:3 in the last 168 hours  CBC:  Lab 10/08/11 0540 10/07/11 1528  WBC 10.2 12.0*  NEUTROABS -- 9.7*  HGB 10.2* 11.7*  HCT 30.7* 34.5*  MCV 88.0 86.7  PLT 413* 484*    Cardiac Enzymes: No results found for this basename: CKTOTAL:5,CKMB:5,CKMBINDEX:5,TROPONINI:5 in the last 168 hours  BNP: No components found with this basename: POCBNP:5  CBG:  Lab 10/09/11 0616 10/08/11 2100 10/08/11 1604 10/08/11 1124 10/08/11 0627  GLUCAP 120* 145* 137* 239* 216*    Coagulation Studies: No results found for this basename: LABPROT:5,INR:5 in the last 72 hours  Other results:   Imaging: Dg Chest Portable 1  View  10/07/2011  *RADIOLOGY REPORT*  Clinical Data: Weakness.  Near syncope.  PORTABLE CHEST - 1 VIEW  Comparison: 10/01/2011.  Findings: Unchanged mild cardiomegaly.  Postoperative changes of CABG.  Lingular scarring and pleural reaction are present.  There is no pleural effusion.  No airspace disease or effusion. Monitoring leads are projected over the chest.  IMPRESSION: No acute cardiopulmonary disease.  CABG and mild enlargement of the heart.  Original Report Authenticated By: Dereck Ligas, M.D.      Medications:     Scheduled Medications:    . aspirin  81 mg Oral Daily  .  carvedilol  3.125 mg Oral BID WC  . ciprofloxacin  500 mg Oral BID  . clopidogrel  75 mg Oral Q breakfast  . escitalopram  10 mg Oral Daily  . insulin aspart  0-15 Units Subcutaneous TID WC  . insulin aspart  4 Units Subcutaneous TID WC  . insulin detemir  10 Units Subcutaneous BID  . levothyroxine  100 mcg Oral QAC breakfast  . pantoprazole  40 mg Oral Q1200  . potassium chloride  40 mEq Oral BID  . potassium chloride  40 mEq Oral Daily  . rosuvastatin  40 mg Oral q1800  . sodium chloride  1,000 mL Intravenous Once  . sodium chloride  3 mL Intravenous Q12H     Infusions:    . 0.9 % NaCl with KCl 40 mEq / L 100 mL/hr at 10/08/11 1110     PRN Medications:  acetaminophen, ondansetron (ZOFRAN) IV   Assessment:   1. Dehydration  2. CAD s/p CABG 1/18  3. Chronic systolic HF EF 0000000  4.. DM2  5. Hypokalemia  6. Hypothyroidism 7. UTI  Plan/Discussion:    Weight up 8lbs today.  Currently awaiting lab results.  Weakness improving will ambulate with PT today, hopefully home in 24-48 hours.    Length of Stay: 2 Leone Haven, PA-C  10/09/2011, 9:05 AM  Much improved. Electrolytes improved. Hold diuretics. Continue current regimen. Continue cipro for UTI. Probably home in am.  Benay Spice 2:04 PM

## 2011-10-10 DIAGNOSIS — I509 Heart failure, unspecified: Secondary | ICD-10-CM

## 2011-10-10 DIAGNOSIS — I2510 Atherosclerotic heart disease of native coronary artery without angina pectoris: Secondary | ICD-10-CM

## 2011-10-10 LAB — GLUCOSE, CAPILLARY
Glucose-Capillary: 243 mg/dL — ABNORMAL HIGH (ref 70–99)
Glucose-Capillary: 171 mg/dL — ABNORMAL HIGH (ref 70–99)

## 2011-10-10 LAB — BASIC METABOLIC PANEL
CO2: 26 mEq/L (ref 19–32)
Calcium: 8.8 mg/dL (ref 8.4–10.5)
GFR calc non Af Amer: 53 mL/min — ABNORMAL LOW (ref >90)
Glucose, Bld: 254 mg/dL — ABNORMAL HIGH (ref 70–99)
Potassium: 4.3 mEq/L (ref 3.5–5.1)
Sodium: 134 mEq/L — ABNORMAL LOW (ref 135–145)

## 2011-10-10 MED ORDER — SODIUM CHLORIDE 0.9 % IV SOLN
INTRAVENOUS | Status: DC
  Administered 2011-10-10 – 2011-10-11 (×3): via INTRAVENOUS

## 2011-10-10 NOTE — Evaluation (Signed)
Physical Therapy Evaluation Patient Details Name: Bridget Martinez MRN: [id number] DOB: 23-Mar-1951 Today's Date: 10/10/2011  Problem List:  Patient Active Problem List  Diagnoses  . HTN (hypertension)  . Hyperlipidemia  . Venous stasis ulcers  . Depression  . CAD, multiple vessel  . Systolic CHF, acute on chronic  . DM type 1 causing renal disease  . Acute renal failure  . UTI (lower urinary tract infection)  . Type 1 diabetes mellitus with neurological manifestations, uncontrolled  . Chronic systolic heart failure  . Volume depletion    Past Medical History:  Past Medical History  Diagnosis Date  . Diabetes mellitus     on insulin, with h/o DKA   . HTN (hypertension)   . Hyperlipidemia   . Venous stasis ulcers   . Q waves suggestive of previous myocardial infarction     Pt denies h/o AMI, cath, PCI  . Angina   . Hypothyroidism   . GERD (gastroesophageal reflux disease)   . Arthritis   . Anxiety   . Depression   . Myocardial infarction     MILD MI  IN 11-12  . Coronary artery disease   . Heart murmur     Dr Jake Seats cardiologist   Past Surgical History:  Past Surgical History  Procedure Date  . Cardiac catheterization   . Tonsillectomy   . Coronary artery bypass graft     PT Assessment/Plan/Recommendation PT Assessment Clinical Impression Statement: Pt adm for dehydration.  Pt with orthostatic hypotension which limited amb distance.  Feel once hypotension improves pt's mobiity will be good. Orthostatic BPs  Sitting 111/55  Standing 82/47  Standing after walking 97/39    PT Recommendation/Assessment: Patient will need skilled PT in the acute care venue PT Problem List: Decreased mobility;Decreased activity tolerance;Other (comment) (low BP) PT Therapy Diagnosis : Difficulty walking PT Plan PT Frequency: Min 3X/week PT Treatment/Interventions: Gait training;Patient/family education PT Recommendation Follow Up Recommendations: No PT follow up Equipment  Recommended: None recommended by PT PT Goals  Acute Rehab PT Goals PT Goal Formulation: With patient Pt will Ambulate: 51 - 150 feet (while maintaining adequate BP) PT Goal: Ambulate - Progress: Goal set today  PT Evaluation Precautions/Restrictions  Precautions Precautions: Fall Restrictions Weight Bearing Restrictions: No Prior Functioning  Home Living Lives With: Spouse Type of Home: House Home Layout: One level Home Access: Stairs to enter Entrance Stairs-Rails: Right Entrance Stairs-Number of Steps: 3 Home Adaptive Equipment: Walker - rolling Prior Function Level of Independence: Independent with transfers;Independent with gait;Independent with basic ADLs;Other (comment) (pt with 3 falls due to decr. BP) Cognition Cognition Arousal/Alertness: Awake/alert Overall Cognitive Status: Appears within functional limits for tasks assessed Orientation Level: Oriented X4 Sensation/Coordination   Extremity Assessment RLE Assessment RLE Assessment: Within Functional Limits LLE Assessment LLE Assessment: Within Functional Limits Mobility (including Balance) Transfers Sit to Stand: 6: Modified independent (Device/Increase time) Stand to Sit: 6: Modified independent (Device/Increase time) Ambulation/Gait Ambulation/Gait Assistance: 5: Supervision Ambulation/Gait Assistance Details (indicate cue type and reason): Amb distance limited by light-headedness due to low BP Ambulation Distance (Feet): 80 Feet (80' x 2) Assistive device: None Gait Pattern: Within Functional Limits    Exercise    End of Session PT - End of Session Activity Tolerance: Treatment limited secondary to medical complications (Comment) (Low BP) Patient left: in chair;with family/visitor present General Behavior During Session: [facility] for tasks performed Cognition: [facility] for tasks performed  [facility] 10/10/2011, 12:15 PM  [employer] PT [phone]

## 2011-10-10 NOTE — Progress Notes (Addendum)
Advanced Heart Failure Rounding Note   Subjective:    61 y/o woman with DM2 and CAD, CHF with iCM EF 20-25%. 09/27/11 Underwent CABG x4  and coronary endarterectomy to circumflex (LIMA to LAD, SVG to diag, SVG to OM and SVG to RCA)    Discharged from [facility] 10/01/11. Discharge weight 164 . ( weight down 21 pounds). Followed by [facility] upon discharge. She started feeling bad Friday with nausea and dizziness. Fell 3 times at home. Home health nurse reported low blood pressure. Stopped Lasix on Saturday. Poor appetite with limited intake due to nausea. Returned to hospital yesterday due to weakness and dehydration. Continues to complain of ongoing dizziness. Digoxin stopped yesterday . Dig level 2.2   UA showed ketones, leukocytes, cipro added 1/29  Feeling better.  Has not really ambulated much.  Dizziness improved.  Denies SOB/PND/Orthopnea.   Objective:    Vital Signs:   Temp:  [97.5 F (36.4 C)-98.3 F (36.8 C)] 97.5 F (36.4 C) (01/31 0910) Pulse Rate:  [84-89] 84  (01/31 0910) Resp:  [18-19] 18  (01/31 0910) BP: (85-111)/(40-57) 85/40 mmHg (01/31 0910) SpO2:  [99 %-100 %] 99 % (01/31 0910) Weight:  [69.9 kg (154 lb 1.6 oz)] 69.9 kg (154 lb 1.6 oz) (01/31 0531) Last BM Date: 10/06/11  24-hour weight change: Weight change: 1.135 kg (2 lb 8 oz)  Intake/Output:   Intake/Output Summary (Last 24 hours) at 10/10/11 0944 Last data filed at 10/10/11 0823  Gross per 24 hour  Intake 3262.25 ml  Output   1275 ml  Net 1987.25 ml     Physical Exam: General:  Well appearing. No resp difficulty HEENT: normal Neck: supple. JVP flat . Carotids 2+ bilat; no bruits. No lymphadenopathy or thryomegaly appreciated. Cor: PMI nondisplaced. Regular rate & rhythm. No rubs, gallops or murmurs. Lungs: clear Abdomen: soft, nontender, nondistended. No hepatosplenomegaly. No bruits or masses. Good bowel sounds. Extremities: no cyanosis, clubbing, rash, edema, incision mid sternum looks good  Neuro:  alert & orientedx3, cranial nerves grossly intact. moves all 4 extremities w/o difficulty. Affect pleasant  Telemetry: SR  Labs: Basic Metabolic Panel:  Lab [id number] 0520 10/09/11 0810 10/08/11 0540 10/07/11 1528  NA 134* 137 131* 129*  K 4.3 3.7 3.2* 2.8*  CL 98 98 90* 83*  CO2 26 26 28 27   GLUCOSE 254* 99 219* 82  BUN 10 13 19 20   CREATININE 1.10 1.13* 1.27* 1.18*  CALCIUM 8.8 9.3 8.9 --  MG -- -- -- --  PHOS -- -- -- --    Liver Function Tests:  Lab 10/08/11 0540 10/07/11 1528  AST 31 31  ALT 14 15  ALKPHOS 142* 168*  BILITOT 0.6 0.8  PROT 6.2 7.5  ALBUMIN 2.9* 3.2*   No results found for this basename: LIPASE:5,AMYLASE:5 in the last 168 hours No results found for this basename: AMMONIA:3 in the last 168 hours  CBC:  Lab 10/08/11 0540 10/07/11 1528  WBC 10.2 12.0*  NEUTROABS -- 9.7*  HGB 10.2* 11.7*  HCT 30.7* 34.5*  MCV 88.0 86.7  PLT 413* 484*    Cardiac Enzymes: No results found for this basename: CKTOTAL:5,CKMB:5,CKMBINDEX:5,TROPONINI:5 in the last 168 hours  BNP: No components found with this basename: POCBNP:5  CBG:  Lab 10/10/11 0802 10/10/11 0620 10/09/11 2113 10/09/11 1552 10/09/11 1127  GLUCAP 171* 243* 154* 246* 200*    Coagulation Studies: No results found for this basename: LABPROT:5,INR:5 in the last 72 hours  Other results:   Imaging: No  results found.   Medications:     Scheduled Medications:    . aspirin  81 mg Oral Daily  . carvedilol  3.125 mg Oral BID WC  . ciprofloxacin  500 mg Oral BID  . clopidogrel  75 mg Oral Q breakfast  . escitalopram  10 mg Oral Daily  . insulin aspart  0-15 Units Subcutaneous TID WC  . insulin aspart  4 Units Subcutaneous TID WC  . insulin detemir  10 Units Subcutaneous BID  . levothyroxine  100 mcg Oral QAC breakfast  . pantoprazole  40 mg Oral Q1200  . rosuvastatin  40 mg Oral q1800  . sodium chloride  3 mL Intravenous Q12H    Infusions:    . 0.9 % NaCl with KCl 40 mEq / L 75  mL/hr at 10/09/11 1545  . DISCONTD: 0.9 % NaCl with KCl 40 mEq / L 100 mL/hr at 10/08/11 1110    PRN Medications: acetaminophen, ondansetron (ZOFRAN) IV   Assessment:   1. Dehydration  2. CAD s/p CABG 1/18  3. Chronic systolic HF EF 0000000  4.. DM2  5. Hypokalemia  6. Hypothyroidism 7. UTI  Plan/Discussion:    Feeling much better.  Will ambulate with PT today.  Home tomorrow if remains stable.  Continue cipro for UTI.    Length of Stay: 3 Edison Pace  10/10/2011, 9:44 AM    Patient seen and examined with Leone Haven PA-C. We discussed all aspects of the encounter. I agree with the assessment and plan as stated above. Will give another 500 cc IVF due to orthostasis. Probably home in am.  Benay Spice

## 2011-10-10 NOTE — Plan of Care (Signed)
Problem: Phase II Progression Outcomes Goal: Progress activity as tolerated unless otherwise ordered Outcome: Progressing Ambulatory with one person assistance, steady gait observed.  No complications noted.

## 2011-10-10 NOTE — Progress Notes (Signed)
Inpatient Diabetes Program Recommendations  AACE/ADA: New Consensus Statement on Inpatient Glycemic Control (2009)  Target Ranges:  Prepandial:   less than 140 mg/dL      Peak postprandial:   less than 180 mg/dL (1-2 hours)      Critically ill patients:  140 - 180 mg/dL   Reason for Visit: Results for Bridget Martinez, Bridget Martinez (MRN [id number]) as of 10/10/2011 12:31  Ref. Range 10/10/2011 06:20 10/10/2011 08:02 10/10/2011 11:09  Glucose-Capillary Latest Range: 70-99 mg/dL 243 (H) 171 (H) 209 (H)    Inpatient Diabetes Program Recommendations Insulin - Basal: Increase Levemir to 15 units bid.  Note:

## 2011-10-11 LAB — BASIC METABOLIC PANEL
BUN: 9 mg/dL (ref 6–23)
Chloride: 98 mEq/L (ref 96–112)
Creatinine, Ser: 1.12 mg/dL — ABNORMAL HIGH (ref 0.50–1.10)
GFR calc Af Amer: 60 mL/min — ABNORMAL LOW (ref >90)
Glucose, Bld: 318 mg/dL — ABNORMAL HIGH (ref 70–99)
Potassium: 3.2 mEq/L — ABNORMAL LOW (ref 3.5–5.1)

## 2011-10-11 LAB — GLUCOSE, CAPILLARY
Glucose-Capillary: 295 mg/dL — ABNORMAL HIGH (ref 70–99)
Glucose-Capillary: 313 mg/dL — ABNORMAL HIGH (ref 70–99)

## 2011-10-11 MED ORDER — CARVEDILOL 6.25 MG PO TABS: 3.1250 mg | ORAL_TABLET | Freq: Two times a day (BID) | ORAL | Status: DC

## 2011-10-11 MED ORDER — ENALAPRIL MALEATE 2.5 MG PO TABS: 2.5000 mg | ORAL_TABLET | Freq: Two times a day (BID) | ORAL | Status: DC

## 2011-10-11 NOTE — Progress Notes (Signed)
CM placed resumption HH RN orders in epic and new [place] [facility] orders. RN is aware. Bethena Roys, RN,BSN, [phone]

## 2011-10-11 NOTE — Progress Notes (Signed)
   CARE MANAGEMENT NOTE 10/11/2011  Patient:  LUWAM, CURRIE   Account Number:  [id number]  Date Initiated:  10/11/2011  Documentation initiated by:  GRAVES-BIGELOW,Huda Petrey  Subjective/Objective Assessment:   Pt admitted has history of congestive heart failure and coronary artery disease. In with volume depletion- iv fluids gently. Pt is currently active with [facility] for RN services.     Action/Plan:   CM spoke to [place] with [facility] and they will need resumption [place] RN orders at d/c. Fax # [phone].   Anticipated DC Date:  10/11/2011   Anticipated DC Plan:  [place]  CM consult      [facility] Choice  Resumption Of Svcs/PTA Provider  HOME HEALTH   Choice offered to / List presented to:  C-1 Patient        [place] arranged  HH-1 RN  [place]      [place] agency  OTHER - SEE NOTE   Status of service:  Completed, signed off Medicare Important Message given?   (If response is "NO", the following Medicare IM given date fields will be blank) Date Medicare IM given:   Date Additional Medicare IM given:    Discharge Disposition:  [place]  Per UR Regulation:    Comments:  10-11-11 [address] Jacqlyn Krauss, RN,BSN [phone] CM spoke with pt and she is planned for d/c today. Pt will need resumption of HH orders for RN for disease management and will need new order for [place]. CM will send information to [facility].

## 2011-10-11 NOTE — Progress Notes (Signed)
10/11/11  Results for LOLLA, WARNES (MRN [id number]) as of 10/11/2011 13:07  Ref. Range 10/09/2011 21:13 10/10/2011 06:20 10/10/2011 08:02 10/10/2011 11:09 10/10/2011 16:05 10/10/2011 21:27 10/11/2011 06:20 10/11/2011 10:41  Glucose-Capillary Latest Range: 70-99 mg/dL 154 (H) 243 (H) 171 (H) 209 (H) 140 (H) 201 (H) 295 (H) 313 (H)  Increase Levemir to 15 units BID if CBGs continue greater than 180 mg/dl. Harvel Ricks RN BSN

## 2011-10-11 NOTE — Progress Notes (Signed)
Physical Therapy Treatment Patient Details Name: ETTER CLAYTOR MRN: [id number] DOB: 09/18/1950 Today's Date: 10/11/2011  PT Assessment/Plan  PT - Assessment/Plan Comments on Treatment Session: Improved today.  Ambulation/mobility seemed to improve patient's BP.  Encouraged patient to ambulate with nursing staff. PT Plan: Discharge plan remains appropriate;Frequency remains appropriate PT Frequency: Min 3X/week Follow Up Recommendations: No PT follow up Equipment Recommended: None recommended by PT PT Goals  Acute Rehab PT Goals PT Goal: Ambulate - Progress: Progressing toward goal  PT Treatment Precautions/Restrictions  Precautions Precautions: Fall Required Braces or Orthoses: No Restrictions Weight Bearing Restrictions: No Mobility (including Balance) Transfers Transfers: Yes Sit to Stand: 6: Modified independent (Device/Increase time);From chair/3-in-1;With armrests Stand to Sit: 6: Modified independent (Device/Increase time);To chair/3-in-1;With armrests Ambulation/Gait Ambulation/Gait: Yes Ambulation/Gait Assistance: 5: Supervision Ambulation/Gait Assistance Details (indicate cue type and reason): Good balance.  Minimal light-headedness reported Ambulation Distance (Feet): 164 Feet Assistive device: None Gait Pattern: Within Functional Limits    Exercise    End of Session PT - End of Session Activity Tolerance: Patient tolerated treatment well (Minimal light-headedness reported today) Patient left: in chair;with call bell in reach;with family/visitor present General Behavior During Session: [facility] for tasks performed Cognition: [facility] for tasks performed  Despina Pole [id number] 10/11/2011, 12:39 PM

## 2011-10-11 NOTE — Discharge Summary (Signed)
Patient ID: Bridget Martinez MRN: [id number] DOB/AGE: [birth year] 62 y.o.  Admit date: 10/07/2011 Discharge date: 10/11/2011  Primary Discharge Diagnosis 1. Dehydration 2. CAD s/p CABG by Dr. Prescott Gum on 09/27/11     - LIMA to LAD, SVG to diag, SVG to OM and SVG to RCA 3. Chronic Systolic HF, EF 0000000 4. ICM 5. Hypokalemia 6. UTI     - finished 3 days of ciprofloxacin   Secondary Discharge Diagnosis 1. DM2 2. Hypothyroidism  Significant Diagnostic Studies:  1. CXR: no acute cardiopulmonary disease, CABG and mild enlargement of the heart.   Hospital Course:  Bridget Martinez is a 61 y.o. female with recent coronary artery bypass grafting as well as systolic HF, DM2, HTN and hypothyroidism who presented to the ER with complaints of progressive weakness, dizziness, nausea and vomiting since discharge 10 days prior.  She had gotten to the point were it was difficult to stand.    In the ER, it was determined she had volume depletion.  Her weight was down 16 pounds, Cr/BUN mildly elevated.  She was given 1L of IV fluids, lasix/potassium and antihypertensives were held.  Digoxin was also held for a dig level of 2.2.   She was admitted for further monitoring and given 2 more L of IV fluids.  Her nausea and dizziness improved.  She walked with PT and was noted to have orthostatic hypotension so she received an extra liter of fluids for a total of 4 liters during her hospitalization.  Her weight improved to 154 pounds.  While she was admitted she was noted to have a UTI and cipro was given for 3 days for uncomplicated UTI.    On day of discharge, Dr. Haroldine Laws evaluated the patient and noted her stable for home.  She ambulated with PT without difficulty and her N/V and dizziness had resolved.  She has been instructed to take lasix for weight gain of 3 pounds or more.  We also restarted her enalipril and low dose coreg.     Discharge Info: Blood pressure 129/56, pulse 92, temperature 97.7 F (36.5 C),  temperature source Oral, resp. rate 20, height 5\' 3"  (1.6 m), weight 70.1 kg (154 lb 8.7 oz), SpO2 98.00%.   Physical Exam:  General: Well appearing. No resp difficulty  HEENT: normal  Neck: supple. JVP flat . Carotids 2+ bilat; no bruits. No lymphadenopathy or thryomegaly appreciated.  Cor: PMI nondisplaced. Regular rate & rhythm. No rubs, gallops or murmurs.  Lungs: clear  Abdomen: soft, nontender, nondistended. No hepatosplenomegaly. No bruits or masses. Good bowel sounds.  Extremities: no cyanosis, clubbing, rash, trace edema, incision mid sternum looks good  Neuro: alert & orientedx3, cranial nerves grossly intact. moves all 4 extremities w/o difficulty. Affect pleasant   Weight change: 0.2 kg (7.1 oz) Results for orders placed during the hospital encounter of 10/07/11 (from the past 24 hour(s))  GLUCOSE, CAPILLARY     Status: Abnormal   Collection Time   10/10/11  4:05 PM      Component Value Range   Glucose-Capillary 140 (*) 70 - 99 (mg/dL)   Comment 1 Notify RN     Comment 2 Documented in Chart    GLUCOSE, CAPILLARY     Status: Abnormal   Collection Time   10/10/11  9:27 PM      Component Value Range   Glucose-Capillary 201 (*) 70 - 99 (mg/dL)   Comment 1 Notify RN     Comment 2 Documented in Chart  BASIC METABOLIC PANEL     Status: Abnormal   Collection Time   10/11/11  5:45 AM      Component Value Range   Sodium 134 (*) 135 - 145 (mEq/L)   Potassium 3.2 (*) 3.5 - 5.1 (mEq/L)   Chloride 98  96 - 112 (mEq/L)   CO2 24  19 - 32 (mEq/L)   Glucose, Bld 318 (*) 70 - 99 (mg/dL)   BUN 9  6 - 23 (mg/dL)   Creatinine, Ser 1.12 (*) 0.50 - 1.10 (mg/dL)   Calcium 8.1 (*) 8.4 - 10.5 (mg/dL)   GFR calc non Af Amer 52 (*) >90 (mL/min)   GFR calc Af Amer 60 (*) >90 (mL/min)  GLUCOSE, CAPILLARY     Status: Abnormal   Collection Time   10/11/11  6:20 AM      Component Value Range   Glucose-Capillary 295 (*) 70 - 99 (mg/dL)  GLUCOSE, CAPILLARY     Status: Abnormal   Collection Time     10/11/11 10:41 AM      Component Value Range   Glucose-Capillary 313 (*) 70 - 99 (mg/dL)   Comment 1 Documented in Chart     Comment 2 Notify RN       Discharge Medications: Medication List  As of 10/11/2011  2:25 PM   STOP taking these medications         digoxin 0.25 MG tablet         TAKE these medications         aspirin 325 MG tablet   Take 325 mg by mouth daily.      carvedilol 6.25 MG tablet   Commonly known as: COREG   Take 0.5 tablets (3.125 mg total) by mouth 2 (two) times daily with a meal.      clopidogrel 75 MG tablet   Commonly known as: PLAVIX   Take 75 mg by mouth daily with breakfast.      enalapril 2.5 MG tablet   Commonly known as: VASOTEC   Take 1 tablet (2.5 mg total) by mouth 2 (two) times daily.      escitalopram 10 MG tablet   Commonly known as: LEXAPRO   Take 10 mg by mouth daily.      insulin aspart 100 UNIT/ML injection   Commonly known as: novoLOG   Inject 5-10 Units into the skin 3 (three) times daily with meals. If blood glucose 70-140 mg/dL, then 3 units.  If blood glucose 141-180 mg/dL, then 4 units. If blood glucose 181-220 mg/dL, then 5 units. If blood glucose 221-260 mg/dL, then 6 units. If blood glucose 261-300 mg/dL, then 7 units. If blood glucose 301-340 mg/dL, then 8 units. If blood glucose 341-380 mg/dL, then 9 units. If blood glucose is greater than 381 then 10 units.      insulin detemir 100 UNIT/ML injection   Commonly known as: LEVEMIR   Inject 10 Units into the skin 2 (two) times daily.      levothyroxine 100 MCG tablet   Commonly known as: SYNTHROID, LEVOTHROID   Take 100 mcg by mouth daily.      pantoprazole 40 MG tablet   Commonly known as: PROTONIX   Take 40 mg by mouth at bedtime.      rosuvastatin 40 MG tablet   Commonly known as: CRESTOR   Take 40 mg by mouth every evening.            Follow-up Plans & Instructions: Discharge Orders  Future Appointments: Provider: Department: Dept Phone: Center:    10/17/2011 12:15 PM [facility] [phone] None   10/23/2011 12:45 PM Len Childs, MD [facility] [phone] [facility]     Future Orders Please Complete By Expires   Diet - low sodium heart healthy      Increase activity slowly      Call MD for:  persistant nausea and vomiting      Call MD for:  difficulty breathing, headache or visual disturbances      (HEART FAILURE PATIENTS) Call MD:  Anytime you have any of the following symptoms: 1) 3 pound weight gain in 24 hours or 5 pounds in 1 week 2) shortness of breath, with or without a dry hacking cough 3) swelling in the hands, feet or stomach 4) if you have to sleep on extra pillows at night in order to breathe.      Call MD for:  redness, tenderness, or signs of infection (pain, swelling, redness, odor or green/yellow discharge around incision site)        Follow-up Information    Follow up with Glori Bickers, MD on 10/17/2011. (at 12:15        (Gate Code 0600))    Contact information:   [facility] TO FOLLOW UP APPOINTMENTS  Time spent with patient to include physician time: 45 minutes  Signed:  Teressa Senter, PA 10/11/2011, 2:25 PM    Patient seen and examined with Leone Haven PA-C. We discussed all aspects of the encounter. I agree with the assessment and plan as stated above. She is ready for d/c. Reinforced need for daily weights and reviewed use of sliding scale diuretics. Only to take lasix if weight up more than 3 pounds. F/u next week. Will need cardiac rehab.   Daniel [name],MD 2:49 PM

## 2011-10-16 ENCOUNTER — Ambulatory Visit (HOSPITAL_COMMUNITY)
Admission: RE | Admit: 2011-10-16 | Discharge: 2011-10-16 | Disposition: A | Discharge status: Home / Self Care | Payer: BC Managed Care – PPO | Source: Ambulatory Visit | Attending: Internal Medicine | Admitting: Internal Medicine

## 2011-10-16 ENCOUNTER — Telehealth (HOSPITAL_COMMUNITY): Payer: Self-pay | Admitting: *Deleted

## 2011-10-16 VITALS — BP 104/40 | HR 87 | Wt 152.5 lb

## 2011-10-16 DIAGNOSIS — I5022 Chronic systolic (congestive) heart failure: Secondary | ICD-10-CM

## 2011-10-16 DIAGNOSIS — I2510 Atherosclerotic heart disease of native coronary artery without angina pectoris: Secondary | ICD-10-CM

## 2011-10-16 DIAGNOSIS — I5023 Acute on chronic systolic (congestive) heart failure: Secondary | ICD-10-CM

## 2011-10-16 LAB — BASIC METABOLIC PANEL
Calcium: 11 mg/dL — ABNORMAL HIGH (ref 8.4–10.5)
GFR calc non Af Amer: 49 mL/min — ABNORMAL LOW (ref >90)
Sodium: 138 mEq/L (ref 135–145)

## 2011-10-16 MED ORDER — FUROSEMIDE 20 MG PO TABS: 20.0000 mg | ORAL_TABLET | ORAL | Status: DC | PRN

## 2011-10-16 NOTE — Telephone Encounter (Signed)
Bridget Martinez called today regarding Bridget Martinez orders.  She would like to speak with you.  Bridget Martinez is coming in today for an appointment, however this provider would like to speak with you directly regarding her orders.  Thank you.

## 2011-10-16 NOTE — Progress Notes (Signed)
Patient ID: Bridget Martinez, female   DOB: 09-12-1950, 61 y.o.   MRN: [id number] HPI: Bridget Martinez is a 61 yo woman with DM2, HTN, hypothyroidism, depression andCHF due to iCM and EF ~20-25%. Admitted with NSTEMI and cardiogenic shock in 12/12.Required inotropic support and 50 pound diuresis.   Eventually underwent  CABG x 4 in January 2013 by Dr. Prescott Gum.  Recently discharged from [facility] 10/11/11 after she was treated for  volume depletion, hypotension and UTI.  Meds and diuretics cut back.  She is here for post hospitalization follow up.Complains of soreness.  Denies SOB/PND/CP/Orthopnea. Occasionally dizzy when she stands. Mild nausea but this has improved. Glucose elevated in am and she will continued to be followed by Dr Buddy Duty. Home Health following. Complaint with all medications. Weight at home 156-158. She is limiting fluid intake to 2 liters daily.  Husband reports she has a poor appetite and she is not moving much at home.   ROS: All systems negative except as listed in HPI, PMH and Problem List.  Past Medical History  Diagnosis Date  . Diabetes mellitus     on insulin, with h/o DKA   . HTN (hypertension)   . Hyperlipidemia   . Venous stasis ulcers   . Q waves suggestive of previous myocardial infarction     Pt denies h/o AMI, cath, PCI  . Angina   . Hypothyroidism   . GERD (gastroesophageal reflux disease)   . Arthritis   . Anxiety   . Depression   . Myocardial infarction     MILD MI  IN 11-12  . Coronary artery disease   . Heart murmur     Dr Jake Seats cardiologist    Current Outpatient Prescriptions  Medication Sig Dispense Refill  . aspirin 325 MG tablet Take 325 mg by mouth daily.        . carvedilol (COREG) 6.25 MG tablet Take 0.5 tablets (3.125 mg total) by mouth 2 (two) times daily with a meal.      . clopidogrel (PLAVIX) 75 MG tablet Take 75 mg by mouth daily with breakfast.      . enalapril (VASOTEC) 2.5 MG tablet Take 1 tablet (2.5 mg total) by mouth 2 (two)  times daily.      Marland Kitchen escitalopram (LEXAPRO) 10 MG tablet Take 10 mg by mouth daily.        . insulin aspart (NOVOLOG) 100 UNIT/ML injection Inject 5-10 Units into the skin 3 (three) times daily with meals. If blood glucose 70-140 mg/dL, then 3 units.  If blood glucose 141-180 mg/dL, then 4 units. If blood glucose 181-220 mg/dL, then 5 units. If blood glucose 221-260 mg/dL, then 6 units. If blood glucose 261-300 mg/dL, then 7 units. If blood glucose 301-340 mg/dL, then 8 units. If blood glucose 341-380 mg/dL, then 9 units. If blood glucose is greater than 381 then 10 units.       . insulin detemir (LEVEMIR) 100 UNIT/ML injection Inject 10 Units into the skin 2 (two) times daily.       Marland Kitchen levothyroxine (SYNTHROID, LEVOTHROID) 100 MCG tablet Take 100 mcg by mouth daily.        . pantoprazole (PROTONIX) 40 MG tablet Take 40 mg by mouth at bedtime.      . rosuvastatin (CRESTOR) 40 MG tablet Take 40 mg by mouth every evening.         PHYSICAL EXAM: Filed Vitals:   10/16/11 1112  BP: 104/40  Pulse: 87   Weight  change:  weight down 2 pounds. 152 pounds (154) General:  Well appearing. No resp difficulty HEENT: normal Neck: supple. JVP 5-6. Carotids 2+ bilaterally; no bruits. No lymphadenopathy or thryomegaly appreciated. Cor: PMI normal. Regular rate & rhythm. No rubs, gallops or murmurs. Lungs: clear Abdomen: soft, nontender, nondistended. No hepatosplenomegaly. No bruits or masses. Good bowel sounds. Extremities: no cyanosis, clubbing, rash, edema Neuro: alert & orientedx3, cranial nerves grossly intact. Moves all 4 extremities w/o difficulty. Affect pleasant.     ASSESSMENT & PLAN:

## 2011-10-16 NOTE — Telephone Encounter (Signed)
Spoke w/Ashley she just wanted to let us know that pt's insurance will not cover a home health aid

## 2011-10-16 NOTE — Patient Instructions (Signed)
Take Lasix 20 mg as needed if weight is 163 pounds or greater.  Please schedule ECHO  Follow up in one month  Do the following things EVERYDAY: 1) Weigh yourself in the morning before breakfast. Write it down and keep it in a log. 2) Take your medicines as prescribed 3) Eat low salt foods-Limit salt (sodium) to 2000mg  per day.  Stay as active as you can everyday

## 2011-10-17 ENCOUNTER — Encounter (HOSPITAL_COMMUNITY): Payer: BC Managed Care – PPO

## 2011-10-17 ENCOUNTER — Other Ambulatory Visit: Payer: Self-pay | Admitting: Cardiothoracic Surgery

## 2011-10-17 DIAGNOSIS — I2510 Atherosclerotic heart disease of native coronary artery without angina pectoris: Secondary | ICD-10-CM

## 2011-10-18 ENCOUNTER — Encounter (HOSPITAL_COMMUNITY): Payer: BC Managed Care – PPO

## 2011-10-23 ENCOUNTER — Ambulatory Visit (INDEPENDENT_AMBULATORY_CARE_PROVIDER_SITE_OTHER): Payer: Self-pay | Admitting: Cardiothoracic Surgery

## 2011-10-23 ENCOUNTER — Encounter: Payer: Self-pay | Admitting: Cardiothoracic Surgery

## 2011-10-23 ENCOUNTER — Ambulatory Visit
Admission: RE | Admit: 2011-10-23 | Discharge: 2011-10-23 | Disposition: A | Discharge status: Home / Self Care | Payer: BC Managed Care – PPO | Source: Ambulatory Visit | Attending: Cardiothoracic Surgery | Admitting: Cardiothoracic Surgery

## 2011-10-23 VITALS — BP 102/55 | HR 88 | Resp 16 | Ht 63.0 in | Wt 155.5 lb

## 2011-10-23 DIAGNOSIS — I2510 Atherosclerotic heart disease of native coronary artery without angina pectoris: Secondary | ICD-10-CM

## 2011-10-23 DIAGNOSIS — E119 Type 2 diabetes mellitus without complications: Secondary | ICD-10-CM

## 2011-10-23 DIAGNOSIS — Z951 Presence of aortocoronary bypass graft: Secondary | ICD-10-CM

## 2011-10-23 DIAGNOSIS — I519 Heart disease, unspecified: Secondary | ICD-10-CM

## 2011-10-23 NOTE — Progress Notes (Signed)
HPI:                  [id number]            [place] [address]          [phone]     The patient returns for her first postop visit one month after multivessel bypass grafting on January 18. At that time she had CABG x4 including endarterectomy of the circumflex coronary. At that time was 20%. She did well after surgery and continues to improve at home. She has no symptoms of angina or significant CHF. Her surgical incision are healing well. She is completing a course of postoperative Plavix for the coronary endarterectomy and then it can be discontinued.  Current Outpatient Prescriptions  Medication Sig Dispense Refill  . aspirin 325 MG tablet Take 325 mg by mouth daily.        . carvedilol (COREG) 6.25 MG tablet Take 0.5 tablets (3.125 mg total) by mouth 2 (two) times daily with a meal.      . clopidogrel (PLAVIX) 75 MG tablet Take 75 mg by mouth daily with breakfast.      . enalapril (VASOTEC) 2.5 MG tablet Take 1 tablet (2.5 mg total) by mouth 2 (two) times daily.      Marland Kitchen escitalopram (LEXAPRO) 10 MG tablet Take 10 mg by mouth daily.        . furosemide (LASIX) 20 MG tablet Take 1 tablet (20 mg total) by mouth as needed (only if weight is greater than 163 pounds).  30 tablet  6  . insulin aspart (NOVOLOG) 100 UNIT/ML injection Inject 5-10 Units into the skin 3 (three) times daily with meals. If blood glucose 70-140 mg/dL, then 3 units.  If blood glucose 141-180 mg/dL, then 4 units. If blood glucose 181-220 mg/dL, then 5 units. If blood glucose 221-260 mg/dL, then 6 units. If blood glucose 261-300 mg/dL, then 7 units. If blood glucose 301-340 mg/dL, then 8 units. If blood glucose 341-380 mg/dL, then 9 units. If blood glucose is greater than 381 then 10 units.       . insulin detemir (LEVEMIR) 100 UNIT/ML injection Inject 10 Units into the skin 2 (two) times daily.       Marland Kitchen levothyroxine (SYNTHROID, LEVOTHROID) 100 MCG tablet Take 100 mcg by mouth daily.        . pantoprazole  (PROTONIX) 40 MG tablet Take 40 mg by mouth at bedtime.      . rosuvastatin (CRESTOR) 40 MG tablet Take 40 mg by mouth every evening.         Physical Exam: Vital signs blood pressure 102/55 heart rate 88 regular saturation 99% room air weight 155 pounds Alert oriented comfortable Lungs clear Cardiac rhythm regular without murmur or gallop Chest and leg incision is well-healed Mild pedal edema  Diagnostic Tests: Chest x-ray revealed mild cardiomegaly no significant pleural effusion fairly clear lung fields with some vascular prominence, stable intact sternal wires  Impression: Gradual improvement following multivessel bypass grafting for ischemic cardiomyopathy. She will continue current medications and start driving in light activities and she was encouraged to start outpatient cardiac rehabilitation  Plan: Continue current meds, start rehabilitation, return as needed. She is instructed not to lift more than 20 pounds until 3 months after surgery.

## 2011-10-27 NOTE — Assessment & Plan Note (Addendum)
No evidence of ischemia. Continue current regimen. Refer cardiac rehab.

## 2011-10-27 NOTE — Assessment & Plan Note (Addendum)
NYHA II. Volume status stable. Will need prn Lasix 20 mg if her weight is 163 or greater. Will repeat ECHO and follow up in month. She will continue to weigh and record daily. Reviewed most recent BMET. Potassium 3.2. Will repeat BMET today.  [place] for heart failure management.  Patient seen and examined with Darrick Grinder, NP. We discussed all aspects of the encounter. I agree with the assessment and plan as stated above.  She is improving slowly.Volume status more stable. Reinforced need for daily weights and reviewed use of sliding scale diuretics. Knows to call clinic if having problems. I suspect post-op depression and deconditioning are also playing a role. Have referred to cardiac rehab. Consider SSRI.

## 2011-11-01 ENCOUNTER — Telehealth (HOSPITAL_COMMUNITY): Payer: Self-pay | Admitting: *Deleted

## 2011-11-01 MED ORDER — CARVEDILOL 6.25 MG PO TABS: 3.1250 mg | ORAL_TABLET | Freq: Two times a day (BID) | ORAL | Status: DC

## 2011-11-01 MED ORDER — ROSUVASTATIN CALCIUM 40 MG PO TABS: 40.0000 mg | ORAL_TABLET | Freq: Every evening | ORAL | Status: DC

## 2011-11-01 MED ORDER — PANTOPRAZOLE SODIUM 40 MG PO TBEC: 40.0000 mg | DELAYED_RELEASE_TABLET | Freq: Every day | ORAL | Status: DC

## 2011-11-01 MED ORDER — ENALAPRIL MALEATE 2.5 MG PO TABS: 2.5000 mg | ORAL_TABLET | Freq: Two times a day (BID) | ORAL | Status: DC

## 2011-11-01 NOTE — Telephone Encounter (Signed)
Needs prescripitions called in and would like for you to call her.

## 2011-11-01 NOTE — Telephone Encounter (Signed)
Sent in refills for coreg, protonix, crestor, and enalapril.  Pt appreciated the call back.

## 2011-11-07 ENCOUNTER — Encounter: Payer: Self-pay | Admitting: Internal Medicine

## 2011-11-13 ENCOUNTER — Other Ambulatory Visit: Payer: Self-pay

## 2011-11-13 ENCOUNTER — Encounter (HOSPITAL_COMMUNITY): Payer: Self-pay | Admitting: Emergency Medicine

## 2011-11-13 ENCOUNTER — Inpatient Hospital Stay (HOSPITAL_COMMUNITY)
Admission: EM | Admit: 2011-11-13 | Discharge: 2011-11-15 | DRG: 569 | Disposition: A | Discharge status: Home / Self Care | Payer: BC Managed Care – PPO | Attending: Family Medicine | Admitting: Family Medicine

## 2011-11-13 DIAGNOSIS — R739 Hyperglycemia, unspecified: Secondary | ICD-10-CM

## 2011-11-13 DIAGNOSIS — Z951 Presence of aortocoronary bypass graft: Secondary | ICD-10-CM

## 2011-11-13 DIAGNOSIS — E876 Hypokalemia: Secondary | ICD-10-CM | POA: Diagnosis not present

## 2011-11-13 DIAGNOSIS — E785 Hyperlipidemia, unspecified: Secondary | ICD-10-CM

## 2011-11-13 DIAGNOSIS — I10 Essential (primary) hypertension: Secondary | ICD-10-CM

## 2011-11-13 DIAGNOSIS — F411 Generalized anxiety disorder: Secondary | ICD-10-CM | POA: Diagnosis present

## 2011-11-13 DIAGNOSIS — I2510 Atherosclerotic heart disease of native coronary artery without angina pectoris: Secondary | ICD-10-CM

## 2011-11-13 DIAGNOSIS — M129 Arthropathy, unspecified: Secondary | ICD-10-CM | POA: Diagnosis present

## 2011-11-13 DIAGNOSIS — N390 Urinary tract infection, site not specified: Principal | ICD-10-CM | POA: Diagnosis present

## 2011-11-13 DIAGNOSIS — E1110 Type 2 diabetes mellitus with ketoacidosis without coma: Secondary | ICD-10-CM | POA: Diagnosis present

## 2011-11-13 DIAGNOSIS — E872 Acidosis, unspecified: Secondary | ICD-10-CM | POA: Diagnosis present

## 2011-11-13 DIAGNOSIS — F3289 Other specified depressive episodes: Secondary | ICD-10-CM | POA: Diagnosis present

## 2011-11-13 DIAGNOSIS — N179 Acute kidney failure, unspecified: Secondary | ICD-10-CM

## 2011-11-13 DIAGNOSIS — I509 Heart failure, unspecified: Secondary | ICD-10-CM | POA: Diagnosis present

## 2011-11-13 DIAGNOSIS — I5023 Acute on chronic systolic (congestive) heart failure: Secondary | ICD-10-CM

## 2011-11-13 DIAGNOSIS — K219 Gastro-esophageal reflux disease without esophagitis: Secondary | ICD-10-CM | POA: Diagnosis present

## 2011-11-13 DIAGNOSIS — E1029 Type 1 diabetes mellitus with other diabetic kidney complication: Secondary | ICD-10-CM

## 2011-11-13 DIAGNOSIS — F329 Major depressive disorder, single episode, unspecified: Secondary | ICD-10-CM | POA: Diagnosis present

## 2011-11-13 DIAGNOSIS — I5022 Chronic systolic (congestive) heart failure: Secondary | ICD-10-CM

## 2011-11-13 DIAGNOSIS — Z7982 Long term (current) use of aspirin: Secondary | ICD-10-CM

## 2011-11-13 DIAGNOSIS — E1065 Type 1 diabetes mellitus with hyperglycemia: Secondary | ICD-10-CM

## 2011-11-13 DIAGNOSIS — I83009 Varicose veins of unspecified lower extremity with ulcer of unspecified site: Secondary | ICD-10-CM

## 2011-11-13 DIAGNOSIS — F32A Depression, unspecified: Secondary | ICD-10-CM

## 2011-11-13 DIAGNOSIS — E1010 Type 1 diabetes mellitus with ketoacidosis without coma: Secondary | ICD-10-CM | POA: Diagnosis present

## 2011-11-13 DIAGNOSIS — Z794 Long term (current) use of insulin: Secondary | ICD-10-CM

## 2011-11-13 LAB — BASIC METABOLIC PANEL
BUN: 24 mg/dL — ABNORMAL HIGH (ref 6–23)
GFR calc Af Amer: 67 mL/min — ABNORMAL LOW (ref >90)
GFR calc non Af Amer: 58 mL/min — ABNORMAL LOW (ref >90)
Potassium: 4.4 mEq/L (ref 3.5–5.1)
Sodium: 125 mEq/L — ABNORMAL LOW (ref 135–145)

## 2011-11-13 LAB — DIFFERENTIAL
Basophils Absolute: 0 K/uL (ref 0.0–0.1)
Basophils Relative: 1 % (ref 0–1)
Eosinophils Absolute: 0.2 K/uL (ref 0.0–0.7)
Eosinophils Relative: 2 % (ref 0–5)
Lymphocytes Relative: 20 % (ref 12–46)
Lymphs Abs: 1.6 K/uL (ref 0.7–4.0)
Monocytes Absolute: 0.5 K/uL (ref 0.1–1.0)
Monocytes Relative: 6 % (ref 3–12)
Neutro Abs: 5.7 K/uL (ref 1.7–7.7)
Neutrophils Relative %: 71 % (ref 43–77)

## 2011-11-13 LAB — GLUCOSE, CAPILLARY
Glucose-Capillary: 337 mg/dL — ABNORMAL HIGH (ref 70–99)
Glucose-Capillary: 504 mg/dL — ABNORMAL HIGH (ref 70–99)
Glucose-Capillary: 517 mg/dL — ABNORMAL HIGH (ref 70–99)

## 2011-11-13 LAB — POCT I-STAT 3, VENOUS BLOOD GAS (G3P V)
Bicarbonate: 19.5 mEq/L — ABNORMAL LOW (ref 20.0–24.0)
O2 Saturation: 83 %
TCO2: 21 mmol/L (ref 0–100)
pCO2, Ven: 40.4 mmHg — ABNORMAL LOW (ref 45.0–50.0)
pH, Ven: 7.291 (ref 7.250–7.300)

## 2011-11-13 LAB — URINE MICROSCOPIC-ADD ON

## 2011-11-13 LAB — URINALYSIS, ROUTINE W REFLEX MICROSCOPIC
Bilirubin Urine: NEGATIVE
Glucose, UA: >1000 mg/dL — AB
Protein, ur: 30 mg/dL — AB
Urobilinogen, UA: 0.2 mg/dL (ref 0.0–1.0)

## 2011-11-13 LAB — CBC
HCT: 32.9 % — ABNORMAL LOW (ref 36.0–46.0)
Hemoglobin: 11.2 g/dL — ABNORMAL LOW (ref 12.0–15.0)
MCH: 30.7 pg (ref 26.0–34.0)
MCHC: 34 g/dL (ref 30.0–36.0)
MCV: 90.1 fL (ref 78.0–100.0)
Platelets: 367 K/uL (ref 150–400)
RBC: 3.65 MIL/uL — ABNORMAL LOW (ref 3.87–5.11)
RDW: 14.4 % (ref 11.5–15.5)
WBC: 8 K/uL (ref 4.0–10.5)

## 2011-11-13 LAB — POCT I-STAT TROPONIN I

## 2011-11-13 MED ORDER — INSULIN NPH (HUMAN) (ISOPHANE) 100 UNIT/ML ~~LOC~~ SUSP
10.0000 [IU] | Freq: Once | SUBCUTANEOUS | Status: AC
  Administered 2011-11-13: 10 [IU] via SUBCUTANEOUS
  Filled 2011-11-13: qty 10

## 2011-11-13 MED ORDER — SODIUM CHLORIDE 0.9 % IV SOLN
INTRAVENOUS | Status: DC
  Administered 2011-11-13: via INTRAVENOUS

## 2011-11-13 MED ORDER — INSULIN REGULAR HUMAN 100 UNIT/ML IJ SOLN
INTRAMUSCULAR | Status: AC
  Administered 2011-11-13: 2.8 [IU]/h via INTRAVENOUS
  Filled 2011-11-13 (×2): qty 1

## 2011-11-13 MED ORDER — DEXTROSE-NACL 5-0.45 % IV SOLN: INTRAVENOUS | Status: DC

## 2011-11-13 MED ORDER — SODIUM CHLORIDE 0.9 % IV BOLUS (SEPSIS)
1000.0000 mL | Freq: Once | INTRAVENOUS | Status: AC
  Administered 2011-11-13: 1000 mL via INTRAVENOUS

## 2011-11-13 MED ORDER — DEXTROSE 50 % IV SOLN: 25.0000 mL | INTRAVENOUS | Status: DC | PRN

## 2011-11-13 MED ORDER — DEXTROSE 5 % IV SOLN
1.0000 g | Freq: Once | INTRAVENOUS | Status: AC
  Administered 2011-11-13: 1 g via INTRAVENOUS
  Filled 2011-11-13: qty 10

## 2011-11-13 NOTE — ED Notes (Signed)
CBG 544

## 2011-11-13 NOTE — ED Provider Notes (Signed)
I saw and evaluated the patient, reviewed the resident's note and I agree with the findings and plan. Patient seen and evaluated. Labs reviewed. Patient mild acidosis awaiting for final blood sugar. Patient to be given IV fluids here and insulin. Anticipate that patient be admitted  Leota Jacobsen, MD 11/13/11 2134

## 2011-11-13 NOTE — ED Notes (Signed)
VBG results shown to Dr. Zenia Resides

## 2011-11-13 NOTE — ED Notes (Signed)
Clestine Risner (Husband): [phone] [facility]) [phone] (Cell)

## 2011-11-13 NOTE — ED Notes (Addendum)
Patient complaining of hyperglycemia that started today (patient checks CBG at home). Highest reading at home 528.  CBG checked in triage - 517.  Patient was seen at Urgent Care today and diagnosed with a UTI (patient has burning during urination).

## 2011-11-13 NOTE — ED Provider Notes (Signed)
History     CSN: [id number]  Arrival date & time 11/13/11  Bridget Martinez   First MD Initiated Contact with Patient 11/13/11 1956      Chief Complaint  Patient presents with  . Hyperglycemia    (Consider location/radiation/quality/duration/timing/severity/associated sxs/prior treatment) HPI History provided by the pt  61 year old female with a history of CAD status post CABG and diabetes on insulin presenting with complaint of nausea. Patient states that she's had about 2-3 days of gradual onset urinary symptoms, including dysuria, hematuria, increased frequency, similar to prior UTIs. She's also had about 1 or so days of nausea without vomiting, abdominal pain, diarrhea, SOB, sweats, fever, or chest pain. However, patient states that her glucose check this morning was in the 500s and she was concerned that the combination of nausea and hyperglycemia were similar to her prior heart attacks. Patient's glucose is not well controlled at baseline, namely in the 200s.  Patient went to Urgent Care today and was diagnosed with a UTI and prescribed Cipro which she has not taken.  Patient still endorses mild nausea.  Sx's are moderate and constant.     Past Medical History  Diagnosis Date  . Diabetes mellitus     on insulin, with h/o DKA   . HTN (hypertension)   . Hyperlipidemia   . Venous stasis ulcers   . Q waves suggestive of previous myocardial infarction     Pt denies h/o AMI, cath, PCI  . Angina   . Hypothyroidism   . GERD (gastroesophageal reflux disease)   . Arthritis   . Anxiety   . Depression   . Myocardial infarction     MILD MI  IN 11-12  . Coronary artery disease   . Heart murmur     Dr Jake Seats cardiologist    Past Surgical History  Procedure Date  . Cardiac catheterization   . Tonsillectomy   . Coronary artery bypass graft     Family History  Problem Relation Age of Onset  . Heart disease Father   . Hypertension Mother   . Multiple sclerosis Father   .  Hyperthyroidism Mother   . Diabetes Cousin     Multiple maternal cousins with type 2 diabetes mellitus  . Diabetes Maternal Uncle     Type 1 diabetes mellitus    History  Substance Use Topics  . Smoking status: Never Smoker   . Smokeless tobacco: Not on file  . Alcohol Use: No    OB History    Grav Para Term Preterm Abortions TAB SAB Ect Mult Living                  Review of Systems  Constitutional: Negative for fever and chills.  HENT: Negative for congestion, sore throat and rhinorrhea.   Eyes: Negative for pain and visual disturbance.  Respiratory: Negative for cough, shortness of breath and wheezing.   Cardiovascular: Negative for chest pain and palpitations.  Gastrointestinal: Positive for nausea. Negative for vomiting, abdominal pain, diarrhea and blood in stool.  Genitourinary: Positive for dysuria, frequency and hematuria.  Musculoskeletal: Negative for back pain and gait problem.  Skin: Negative for rash and wound.  Neurological: Negative for dizziness and headaches.  Psychiatric/Behavioral: Negative for confusion and agitation.  All other systems reviewed and are negative.    Allergies  Sulfa antibiotics  Home Medications   Current Outpatient Rx  Name Route Sig Dispense Refill  . ASPIRIN 325 MG PO TABS Oral Take 325 mg by mouth daily.      Marland Kitchen  CARVEDILOL 6.25 MG PO TABS Oral Take 3.125 mg by mouth 2 (two) times daily with a meal.    . CLOPIDOGREL BISULFATE 75 MG PO TABS Oral Take 75 mg by mouth daily with breakfast.    . ENALAPRIL MALEATE 2.5 MG PO TABS Oral Take 2.5 mg by mouth 2 (two) times daily.    Marland Kitchen ESCITALOPRAM OXALATE 10 MG PO TABS Oral Take 10 mg by mouth daily.      . INSULIN ASPART 100 UNIT/ML [place] SOLN Subcutaneous Inject 5-10 Units into the skin 3 (three) times daily with meals. If blood glucose 70-140 mg/dL, then 3 units.  If blood glucose 141-180 mg/dL, then 4 units. If blood glucose 181-220 mg/dL, then 5 units. If blood glucose 221-260 mg/dL, then  6 units. If blood glucose 261-300 mg/dL, then 7 units. If blood glucose 301-340 mg/dL, then 8 units. If blood glucose 341-380 mg/dL, then 9 units. If blood glucose is greater than 381 then 10 units.     . INSULIN DETEMIR 100 UNIT/ML [place] SOLN Subcutaneous Inject 13 Units into the skin 2 (two) times daily.     Marland Kitchen LEVOTHYROXINE SODIUM 100 MCG PO TABS Oral Take 100 mcg by mouth daily.      Marland Kitchen PANTOPRAZOLE SODIUM 40 MG PO TBEC Oral Take 40 mg by mouth at bedtime.    Marland Kitchen ROSUVASTATIN CALCIUM 40 MG PO TABS Oral Take 40 mg by mouth every evening.    . FUROSEMIDE 20 MG PO TABS Oral Take 1 tablet (20 mg total) by mouth as needed (only if weight is greater than 163 pounds). 30 tablet 6    BP 136/44  Pulse 87  Temp 98 F (36.7 C)  Resp 16  SpO2 99%  Physical Exam  Nursing note and vitals reviewed. Constitutional: She is oriented to person, place, and time.       Mildly obese, alert, appears mildly anxious but in no acute distress  HENT:  Head: Normocephalic and atraumatic.  Right Ear: External ear normal.  Left Ear: External ear normal.  Nose: Nose normal.       Mildly dry mucous membranes   Eyes: Conjunctivae and EOM are normal. Pupils are equal, round, and reactive to light.  Neck: Normal range of motion. Neck supple.  Cardiovascular: Normal rate, regular rhythm and intact distal pulses.   No murmur heard. Pulmonary/Chest: Effort normal and breath sounds normal. No respiratory distress.  Abdominal: Soft. Bowel sounds are normal. There is no tenderness.  Musculoskeletal: Normal range of motion. She exhibits no edema.  Neurological: She is alert and oriented to person, place, and time.  Skin: Skin is warm and dry. No rash noted. She is not diaphoretic.  Psychiatric: She has a normal mood and affect. Judgment normal.    ED Course  Procedures (including critical care time)   Date: 11/13/2011  Rate:  90  Rhythm: normal sinus rhythm  QRS Axis: normal  Intervals: normal  ST/T Wave  abnormalities: TWI in aVL and flattening of I, mildly greater than prior on 10/07/11; no ST elevation to suggest acute ischemia.    Labs Reviewed  GLUCOSE, CAPILLARY - Abnormal; Notable for the following:    Glucose-Capillary 517 (*)    All other components within normal limits  CBC - Abnormal; Notable for the following:    RBC 3.65 (*)    Hemoglobin 11.2 (*)    HCT 32.9 (*)    All other components within normal limits  BASIC METABOLIC PANEL - Abnormal; Notable for the  following:    Sodium 125 (*)    Chloride 87 (*)    CO2 17 (*)    Glucose, Bld 563 (*)    BUN 24 (*)    Calcium 10.7 (*)    GFR calc non Af Amer 58 (*)    GFR calc Af Amer 67 (*)    All other components within normal limits  URINALYSIS, ROUTINE W REFLEX MICROSCOPIC - Abnormal; Notable for the following:    APPearance TURBID (*)    Glucose, UA >1000 (*)    Hgb urine dipstick SMALL (*)    Ketones, ur 40 (*)    Protein, ur 30 (*)    Leukocytes, UA MODERATE (*)    All other components within normal limits  GLUCOSE, CAPILLARY - Abnormal; Notable for the following:    Glucose-Capillary 544 (*)    All other components within normal limits  POCT I-STAT 3, BLOOD GAS (G3P V) - Abnormal; Notable for the following:    pCO2, Ven 40.4 (*)    pO2, Ven 52.0 (*)    Bicarbonate 19.5 (*)    Acid-base deficit 7.0 (*)    All other components within normal limits  URINE MICROSCOPIC-ADD ON - Abnormal; Notable for the following:    Squamous Epithelial / LPF MANY (*)    Bacteria, UA MANY (*)    Casts GRANULAR CAST (*)    All other components within normal limits  GLUCOSE, CAPILLARY - Abnormal; Notable for the following:    Glucose-Capillary 504 (*)    All other components within normal limits  GLUCOSE, CAPILLARY - Abnormal; Notable for the following:    Glucose-Capillary 337 (*)    All other components within normal limits  GLUCOSE, CAPILLARY - Abnormal; Notable for the following:    Glucose-Capillary 345 (*)    All other  components within normal limits  DIFFERENTIAL  POCT I-STAT TROPONIN I  URINE CULTURE  BASIC METABOLIC PANEL   No results found.   1. Hyperglycemia   2. UTI (urinary tract infection)   3. Metabolic acidosis       MDM  61 year old female with history of CAD and diabetes presenting with complaints of nausea, hyperglycemia, and recent UTI diagnosis. No associated chest pain, shortness of breath, fever, or recent illness. No vomiting, abdominal pain, or diarrhea.  Exam as above, signs of mild dehydration, benign abd.  EKG without acute ischemia and NL trop.  Labs c/f early DKA with glu 563, pH 7.291, ketonuria, bicarb 17, and anion gap 21.  Corrected Na of ~132.  NS bolus x 1 ordered and insulin 10 units IV.  [facility] consulted and will admit.  Rec starting glucostabilizer order set with insulin gtt and cont MIVF of 125cc/hr.  Repeat glu 337.  RN aware of need to repeat in 30 min for close glycemic control and possible need to add dextrose to the fluids.  Repeat BMP ordered at 11:57 PM and pending.  Repeat glu 345; will cont mgmt.    Chana Bode, MD 11/14/11 0030

## 2011-11-13 NOTE — ED Notes (Addendum)
EDP are aware of pts elevated CBG. Insulin was given to pt per orders. Will continue to monitor.

## 2011-11-13 NOTE — ED Notes (Addendum)
Pt stated that she was has a hx of UTI for about 2-3 days. Has not started taking antibiotic yet.  However, today her CBG has been greater than 600 earlier. No respiratory distress or cardiac distress. Pt states that she feels nauseated. No vomiting. No change in vision. Increased urination. No increased hunger or increased thirst. Will continue to monitor.

## 2011-11-14 ENCOUNTER — Other Ambulatory Visit (HOSPITAL_COMMUNITY): Payer: Self-pay | Admitting: *Deleted

## 2011-11-14 ENCOUNTER — Inpatient Hospital Stay (HOSPITAL_COMMUNITY): Payer: BC Managed Care – PPO

## 2011-11-14 ENCOUNTER — Encounter (HOSPITAL_COMMUNITY): Payer: Self-pay | Admitting: Internal Medicine

## 2011-11-14 DIAGNOSIS — E872 Acidosis, unspecified: Secondary | ICD-10-CM | POA: Diagnosis present

## 2011-11-14 DIAGNOSIS — E1110 Type 2 diabetes mellitus with ketoacidosis without coma: Secondary | ICD-10-CM | POA: Diagnosis present

## 2011-11-14 DIAGNOSIS — N390 Urinary tract infection, site not specified: Secondary | ICD-10-CM | POA: Diagnosis present

## 2011-11-14 LAB — BASIC METABOLIC PANEL
BUN: 21 mg/dL (ref 6–23)
BUN: 19 mg/dL (ref 6–23)
BUN: 18 mg/dL (ref 6–23)
BUN: 17 mg/dL (ref 6–23)
CO2: 24 mEq/L (ref 19–32)
Calcium: 9.9 mg/dL (ref 8.4–10.5)
Calcium: 9.4 mg/dL (ref 8.4–10.5)
Calcium: 9.3 mg/dL (ref 8.4–10.5)
Calcium: 9.8 mg/dL (ref 8.4–10.5)
Chloride: 100 mEq/L (ref 96–112)
Creatinine, Ser: 0.96 mg/dL (ref 0.50–1.10)
Creatinine, Ser: 0.94 mg/dL (ref 0.50–1.10)
Creatinine, Ser: 0.94 mg/dL (ref 0.50–1.10)
Creatinine, Ser: 0.91 mg/dL (ref 0.50–1.10)
Creatinine, Ser: 0.85 mg/dL (ref 0.50–1.10)
GFR calc Af Amer: 74 mL/min — ABNORMAL LOW (ref >90)
GFR calc Af Amer: 84 mL/min — ABNORMAL LOW (ref >90)
GFR calc non Af Amer: 63 mL/min — ABNORMAL LOW (ref >90)
GFR calc non Af Amer: 64 mL/min — ABNORMAL LOW (ref >90)
GFR calc non Af Amer: 67 mL/min — ABNORMAL LOW (ref >90)
GFR calc non Af Amer: 72 mL/min — ABNORMAL LOW (ref >90)
Glucose, Bld: 351 mg/dL — ABNORMAL HIGH (ref 70–99)
Glucose, Bld: 67 mg/dL — ABNORMAL LOW (ref 70–99)
Potassium: 4.3 mEq/L (ref 3.5–5.1)
Sodium: 136 mEq/L (ref 135–145)

## 2011-11-14 LAB — GLUCOSE, CAPILLARY
Glucose-Capillary: 220 mg/dL — ABNORMAL HIGH (ref 70–99)
Glucose-Capillary: 68 mg/dL — ABNORMAL LOW (ref 70–99)
Glucose-Capillary: 318 mg/dL — ABNORMAL HIGH (ref 70–99)
Glucose-Capillary: 331 mg/dL — ABNORMAL HIGH (ref 70–99)
Glucose-Capillary: 279 mg/dL — ABNORMAL HIGH (ref 70–99)

## 2011-11-14 LAB — URINE CULTURE
Colony Count: NO GROWTH
Culture: NO GROWTH

## 2011-11-14 LAB — CBC
Hemoglobin: 9.9 g/dL — ABNORMAL LOW (ref 12.0–15.0)
MCH: 30.5 pg (ref 26.0–34.0)
MCHC: 34.1 g/dL (ref 30.0–36.0)
Platelets: 338 10*3/uL (ref 150–400)
RDW: 14.3 % (ref 11.5–15.5)

## 2011-11-14 MED ORDER — ONDANSETRON HCL 4 MG/2ML IJ SOLN
4.0000 mg | Freq: Three times a day (TID) | INTRAMUSCULAR | Status: DC | PRN
  Administered 2011-11-14: 4 mg via INTRAVENOUS
  Filled 2011-11-14: qty 2

## 2011-11-14 MED ORDER — INSULIN GLARGINE 100 UNIT/ML ~~LOC~~ SOLN
12.0000 [IU] | Freq: Every day | SUBCUTANEOUS | Status: DC
  Administered 2011-11-14: 12 [IU] via SUBCUTANEOUS
  Filled 2011-11-14: qty 3

## 2011-11-14 MED ORDER — INSULIN ASPART 100 UNIT/ML ~~LOC~~ SOLN: 6.0000 [IU] | Freq: Three times a day (TID) | SUBCUTANEOUS | Status: DC

## 2011-11-14 MED ORDER — ATORVASTATIN CALCIUM 80 MG PO TABS
80.0000 mg | ORAL_TABLET | Freq: Every day | ORAL | Status: DC
  Administered 2011-11-14: 80 mg via ORAL
  Filled 2011-11-14 (×2): qty 1

## 2011-11-14 MED ORDER — POTASSIUM CHLORIDE 10 MEQ/100ML IV SOLN
10.0000 meq | INTRAVENOUS | Status: AC
  Administered 2011-11-14 (×2): 10 meq via INTRAVENOUS
  Filled 2011-11-14 (×2): qty 100

## 2011-11-14 MED ORDER — ENALAPRIL MALEATE 2.5 MG PO TABS
2.5000 mg | ORAL_TABLET | Freq: Two times a day (BID) | ORAL | Status: DC
  Administered 2011-11-14 – 2011-11-15 (×3): 2.5 mg via ORAL
  Filled 2011-11-14 (×6): qty 1

## 2011-11-14 MED ORDER — SODIUM CHLORIDE 0.9 % IV SOLN
INTRAVENOUS | Status: DC
  Administered 2011-11-14: 20:00:00 via INTRAVENOUS
  Filled 2011-11-14 (×2): qty 1000

## 2011-11-14 MED ORDER — SODIUM CHLORIDE 0.9 % IV SOLN
INTRAVENOUS | Status: DC
  Filled 2011-11-14: qty 1

## 2011-11-14 MED ORDER — INSULIN GLARGINE 100 UNIT/ML ~~LOC~~ SOLN
5.0000 [IU] | Freq: Every day | SUBCUTANEOUS | Status: DC
  Filled 2011-11-14: qty 3

## 2011-11-14 MED ORDER — GLUCAGON HCL (RDNA) 1 MG IJ SOLR: 0.5000 mg | Freq: Once | INTRAMUSCULAR | Status: AC | PRN

## 2011-11-14 MED ORDER — DEXTROSE 50 % IV SOLN: 25.0000 mL | INTRAVENOUS | Status: DC | PRN

## 2011-11-14 MED ORDER — ASPIRIN 325 MG PO TABS
325.0000 mg | ORAL_TABLET | Freq: Every day | ORAL | Status: DC
  Administered 2011-11-14 – 2011-11-15 (×2): 325 mg via ORAL
  Filled 2011-11-14 (×2): qty 1

## 2011-11-14 MED ORDER — SODIUM CHLORIDE 0.9 % IV SOLN: Freq: Once | INTRAVENOUS | Status: DC

## 2011-11-14 MED ORDER — LEVOTHYROXINE SODIUM 100 MCG PO TABS
100.0000 ug | ORAL_TABLET | Freq: Every day | ORAL | Status: DC
  Administered 2011-11-14 – 2011-11-15 (×2): 100 ug via ORAL
  Filled 2011-11-14 (×4): qty 1

## 2011-11-14 MED ORDER — PANTOPRAZOLE SODIUM 40 MG PO TBEC: 40.0000 mg | DELAYED_RELEASE_TABLET | Freq: Two times a day (BID) | ORAL | Status: DC

## 2011-11-14 MED ORDER — GLUCAGON HCL (RDNA) 1 MG IJ SOLR: 1.0000 mg | Freq: Once | INTRAMUSCULAR | Status: AC | PRN

## 2011-11-14 MED ORDER — CEFTRIAXONE SODIUM 1 G IJ SOLR
1.0000 g | INTRAMUSCULAR | Status: DC
  Administered 2011-11-14: 1 g via INTRAVENOUS
  Filled 2011-11-14 (×2): qty 10

## 2011-11-14 MED ORDER — INSULIN ASPART 100 UNIT/ML ~~LOC~~ SOLN
6.0000 [IU] | Freq: Three times a day (TID) | SUBCUTANEOUS | Status: DC
  Administered 2011-11-14 – 2011-11-15 (×2): 6 [IU] via SUBCUTANEOUS

## 2011-11-14 MED ORDER — INSULIN ASPART 100 UNIT/ML ~~LOC~~ SOLN
4.0000 [IU] | Freq: Three times a day (TID) | SUBCUTANEOUS | Status: DC
  Administered 2011-11-14: 4 [IU] via SUBCUTANEOUS

## 2011-11-14 MED ORDER — CARVEDILOL 3.125 MG PO TABS
3.1250 mg | ORAL_TABLET | Freq: Two times a day (BID) | ORAL | Status: DC
  Administered 2011-11-14 – 2011-11-15 (×3): 3.125 mg via ORAL
  Filled 2011-11-14 (×5): qty 1

## 2011-11-14 MED ORDER — INSULIN ASPART 100 UNIT/ML ~~LOC~~ SOLN
0.0000 [IU] | Freq: Three times a day (TID) | SUBCUTANEOUS | Status: DC
  Administered 2011-11-14: 7 [IU] via SUBCUTANEOUS
  Administered 2011-11-14: 2 [IU] via SUBCUTANEOUS
  Administered 2011-11-14: 7 [IU] via SUBCUTANEOUS
  Administered 2011-11-15: 5 [IU] via SUBCUTANEOUS
  Filled 2011-11-14: qty 3

## 2011-11-14 MED ORDER — HEPARIN SODIUM (PORCINE) 5000 UNIT/ML IJ SOLN
5000.0000 [IU] | Freq: Three times a day (TID) | INTRAMUSCULAR | Status: DC
  Administered 2011-11-14 – 2011-11-15 (×4): 5000 [IU] via SUBCUTANEOUS
  Filled 2011-11-14 (×8): qty 1

## 2011-11-14 MED ORDER — ESCITALOPRAM OXALATE 10 MG PO TABS
10.0000 mg | ORAL_TABLET | Freq: Every day | ORAL | Status: DC
  Administered 2011-11-14 – 2011-11-15 (×2): 10 mg via ORAL
  Filled 2011-11-14 (×3): qty 1

## 2011-11-14 MED ORDER — INSULIN GLARGINE 100 UNIT/ML ~~LOC~~ SOLN
15.0000 [IU] | Freq: Two times a day (BID) | SUBCUTANEOUS | Status: DC
  Administered 2011-11-14 – 2011-11-15 (×2): 15 [IU] via SUBCUTANEOUS

## 2011-11-14 MED ORDER — LIVING WELL WITH DIABETES BOOK
Freq: Once | Status: AC
Start: 2011-11-14 — End: 2011-11-14
  Administered 2011-11-14: 06:00:00
  Filled 2011-11-14: qty 1

## 2011-11-14 MED ORDER — GLUCOSE 40 % PO GEL: 1.0000 | ORAL | Status: DC | PRN

## 2011-11-14 MED ORDER — DEXTROSE 50 % IV SOLN: 50.0000 mL | Freq: Once | INTRAVENOUS | Status: AC | PRN

## 2011-11-14 MED ORDER — CLOPIDOGREL BISULFATE 75 MG PO TABS
75.0000 mg | ORAL_TABLET | Freq: Every day | ORAL | Status: DC
  Administered 2011-11-14 – 2011-11-15 (×2): 75 mg via ORAL
  Filled 2011-11-14 (×2): qty 1

## 2011-11-14 MED ORDER — DEXTROSE-NACL 5-0.45 % IV SOLN
INTRAVENOUS | Status: DC
  Administered 2011-11-14 (×3): via INTRAVENOUS

## 2011-11-14 MED ORDER — POTASSIUM CHLORIDE CRYS ER 10 MEQ PO TBCR
10.0000 meq | EXTENDED_RELEASE_TABLET | Freq: Two times a day (BID) | ORAL | Status: DC
  Administered 2011-11-14 – 2011-11-15 (×3): 10 meq via ORAL
  Filled 2011-11-14 (×4): qty 1

## 2011-11-14 MED ORDER — DEXTROSE 50 % IV SOLN: 25.0000 mL | Freq: Once | INTRAVENOUS | Status: AC | PRN

## 2011-11-14 MED FILL — Insulin NPH (Human) (Isophane) Inj 100 Unit/ML: SUBCUTANEOUS | Qty: 0.1 | Status: AC

## 2011-11-14 NOTE — ED Notes (Signed)
Spoke with Flow [facility] about pt bed placement. Stated they will complete. Will continue to monitor.

## 2011-11-14 NOTE — H&P (Signed)
PCP:  Bridget Burly, MD, MD  [name] endocrinology  Chief Complaint:  Nausea, elevated BS's  HPI: 42yoF with h/o diabetes and prior DKA, NSTEMI and cardiogenic shock in 08/2011, s/p CABG  09/27/2011, chronic sCHF 20-25% presents with DKA and UTI.    Pt is known to this Probation officer, having admitted her in 07/2011 with DKA and ischemic heart disease.  That admission, she was found to have NSTEMI and eventually got CABG 09/2011, which is currently a  stable issue. She was admitted last month to cardiology with dehydration and given IVF's and her  cardiac med regimen was downtitrated. She now comes back with several days of high BS's >300 at  home. She has some abdominal pain and back pain that is not too severe, and nausea but no vomiting  or diarrhea. She denies any cardiopulmonary symptoms at all, no chest pain, SOB, palpitations,  etc. She is endorsing dysuria though, but no fevers, chills, or sweats.   In the ED vitals were stable except BP ranging 98/44 - 109/42. Chemistry panel with hypoNa 125,  hypoCl 87, HCO3 17, renal 24/1.02, glucose 563, Trop negative. CBC stable. UA with glucose,  ketones, and signs of infection. UCx pending. VGB was 7.29 / 40 / 52 / 20. She was started on  Ceftriaxone and glucose stabilizer and is now getting her 2nd L of NS.   ROS as above, otherwise negative or unremarkable.    Past Medical History  Diagnosis Date  . Diabetes mellitus     on insulin, with h/o DKA   . HTN (hypertension)   . Hyperlipidemia   . Venous stasis ulcers   . Angina   . Hypothyroidism   . GERD (gastroesophageal reflux disease)   . Arthritis   . Anxiety   . Depression   . 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 Coronary artery disease   . Heart murmur     Dr Haroldine Martinez cardiologist    Past Surgical History  Procedure Date  . Cardiac catheterization   . Tonsillectomy   . Coronary artery bypass graft     Dr.  [place] Gum in 09/2011     Medications:  HOME MEDS: Prior to Admission medications   Medication Sig Start Date End Date Taking? Authorizing Provider  aspirin 325 MG tablet Take 325 mg by mouth daily.     Yes Historical Provider, MD  carvedilol (COREG) 6.25 MG tablet Take 3.125 mg by mouth 2 (two) times daily with a meal. 11/01/11 10/31/12 Yes Bridget Emery, PA  clopidogrel (PLAVIX) 75 MG tablet Take 75 mg by mouth daily with breakfast. 10/01/11 09/30/12 Yes Donielle Liston Alba, PA  enalapril (VASOTEC) 2.5 MG tablet Take 2.5 mg by mouth 2 (two) times daily. 11/01/11 10/31/12 Yes Bridget Emery, PA  escitalopram (LEXAPRO) 10 MG tablet Take 10 mg by mouth daily.     Yes Historical Provider, MD  insulin aspart (NOVOLOG) 100 UNIT/ML injection Inject 5-10 Units into the skin 3 (three) times daily with meals. If blood glucose 70-140 mg/dL, then 3 units.  If blood glucose 141-180 mg/dL, then 4 units. If blood glucose 181-220 mg/dL, then 5 units. If blood glucose 221-260 mg/dL, then 6 units. If blood glucose 261-300 mg/dL, then 7 units. If blood glucose 301-340 mg/dL, then 8 units. If blood glucose 341-380 mg/dL, then 9 units. If blood glucose is greater than 381 then 10 units.  08/12/11 08/11/12 Yes Bridget Janna Arch,  MD  insulin detemir (LEVEMIR) 100 UNIT/ML injection Inject 13 Units into the skin 2 (two) times daily.  08/12/11 08/11/12 Yes Bridget Janna Arch, MD  levothyroxine (SYNTHROID, LEVOTHROID) 100 MCG tablet Take 100 mcg by mouth daily.     Yes Historical Provider, MD  pantoprazole (PROTONIX) 40 MG tablet Take 40 mg by mouth at bedtime. 11/01/11 10/31/12 Yes Bridget Emery, PA  rosuvastatin (CRESTOR) 40 MG tablet Take 40 mg by mouth every evening. 11/01/11 10/31/12 Yes Bridget Emery, PA  furosemide (LASIX) 20 MG tablet Take 1 tablet (20 mg total) by mouth as needed (only if weight is greater than 163 pounds). 10/16/11 10/15/12  Bridget Grinder, NP    Allergies:  Allergies  Allergen Reactions  . Sulfa Antibiotics  Anaphylaxis and Swelling    Social History:   reports that she has never smoked. She does not have any smokeless tobacco history on file. She reports that she does not drink alcohol or use illicit drugs.  Family History: Family History  Problem Relation Age of Onset  . Heart disease Father   . Hypertension Mother   . Multiple sclerosis Father   . Hyperthyroidism Mother   . Diabetes Cousin     Multiple maternal cousins with type 2 diabetes mellitus  . Diabetes Maternal Uncle     Type 1 diabetes mellitus    Physical Exam: Filed Vitals:   11/13/11 2215 11/13/11 2230 11/13/11 2245 11/13/11 2300  BP: 102/35 110/48 98/44 109/42  Pulse: 84 90 92 91  Temp:      TempSrc:      Resp: 13 14 13 13   SpO2: 100% 100% 100% 100%   Blood pressure 109/42, pulse 91, temperature 98.5 F (36.9 C), temperature source Oral, resp. rate 13, SpO2 100.00%.  Gen: Middle aged appearing F in no distress, appears well, in makeup, able to relate history well,  breathing comfortably without distress or increased WOB.  HEENT: Pupils round and reactive, EOMI, sclera clear and normal appearing. Mouth moist and normal.  Neck: thick, but don't appreciate gross jugular distention, and pulsation near her ear are  carotid. No increased JVD by my exam Lungs: CTAB no w/c/r, good air movement, overall normal exam Heart: Regular without tachycardia, and do not appreciate m/g. Normal exam. There is healing  sternal scar at midline that appears well Abd: Soft, NT ND, benign exam overall, although pt says it's a bit sore to palpation, but not  overtly Extrem: Warm, perfusing well, radials palpable. No BLE edema noted at all Neuro: Alert, attentive, normal exam, CN 2-12 intact. Grossly non-focal    Labs & Imaging Results for orders placed during the hospital encounter of 11/13/11 (from the past 48 hour(s))  GLUCOSE, CAPILLARY     Status: Abnormal   Collection Time   11/13/11  7:11 PM      Component Value Range Comment     Glucose-Capillary 517 (*) 70 - 99 (mg/dL)    Comment 1 Documented in Chart      Comment 2 Notify RN     CBC     Status: Abnormal   Collection Time   11/13/11  8:34 PM      Component Value Range Comment   WBC 8.0  4.0 - 10.5 (K/uL)    RBC 3.65 (*) 3.87 - 5.11 (MIL/uL)    Hemoglobin 11.2 (*) 12.0 - 15.0 (g/dL)    HCT 32.9 (*) 36.0 - 46.0 (%)    MCV 90.1  78.0 - 100.0 (fL)  MCH 30.7  26.0 - 34.0 (pg)    MCHC 34.0  30.0 - 36.0 (g/dL)    RDW 14.4  11.5 - 15.5 (%)    Platelets 367  150 - 400 (K/uL)   DIFFERENTIAL     Status: Normal   Collection Time   11/13/11  8:34 PM      Component Value Range Comment   Neutrophils Relative 71  43 - 77 (%)    Neutro Abs 5.7  1.7 - 7.7 (K/uL)    Lymphocytes Relative 20  12 - 46 (%)    Lymphs Abs 1.6  0.7 - 4.0 (K/uL)    Monocytes Relative 6  3 - 12 (%)    Monocytes Absolute 0.5  0.1 - 1.0 (K/uL)    Eosinophils Relative 2  0 - 5 (%)    Eosinophils Absolute 0.2  0.0 - 0.7 (K/uL)    Basophils Relative 1  0 - 1 (%)    Basophils Absolute 0.0  0.0 - 0.1 (K/uL)   BASIC METABOLIC PANEL     Status: Abnormal   Collection Time   11/13/11  8:34 PM      Component Value Range Comment   Sodium 125 (*) 135 - 145 (mEq/L)    Potassium 4.4  3.5 - 5.1 (mEq/L)    Chloride 87 (*) 96 - 112 (mEq/L)    CO2 17 (*) 19 - 32 (mEq/L)    Glucose, Bld 563 (*) 70 - 99 (mg/dL)    BUN 24 (*) 6 - 23 (mg/dL)    Creatinine, Ser 1.02  0.50 - 1.10 (mg/dL)    Calcium 10.7 (*) 8.4 - 10.5 (mg/dL)    GFR calc non Af Amer 58 (*) >90 (mL/min)    GFR calc Af Amer 67 (*) >90 (mL/min)   POCT I-STAT TROPONIN I     Status: Normal   Collection Time   11/13/11  8:43 PM      Component Value Range Comment   Troponin i, poc 0.01  0.00 - 0.08 (ng/mL)    Comment 3            GLUCOSE, CAPILLARY     Status: Abnormal   Collection Time   11/13/11  8:49 PM      Component Value Range Comment   Glucose-Capillary 544 (*) 70 - 99 (mg/dL)    Comment 1 Documented in Chart      Comment 2 Call MD NNP PA CNM      URINALYSIS, ROUTINE W REFLEX MICROSCOPIC     Status: Abnormal   Collection Time   11/13/11  9:33 PM      Component Value Range Comment   Color, Urine YELLOW  YELLOW     APPearance TURBID (*) CLEAR     Specific Gravity, Urine 1.030  1.005 - 1.030     pH 5.5  5.0 - 8.0     Glucose, UA >1000 (*) NEGATIVE (mg/dL)    Hgb urine dipstick SMALL (*) NEGATIVE     Bilirubin Urine NEGATIVE  NEGATIVE     Ketones, ur 40 (*) NEGATIVE (mg/dL)    Protein, ur 30 (*) NEGATIVE (mg/dL)    Urobilinogen, UA 0.2  0.0 - 1.0 (mg/dL)    Nitrite NEGATIVE  NEGATIVE     Leukocytes, UA MODERATE (*) NEGATIVE    URINE MICROSCOPIC-ADD ON     Status: Abnormal   Collection Time   11/13/11  9:33 PM      Component Value Range Comment  Squamous Epithelial / LPF MANY (*) RARE     WBC, UA TOO NUMEROUS TO COUNT  <3 (WBC/hpf)    RBC / HPF 3-6  <3 (RBC/hpf)    Bacteria, UA MANY (*) RARE     Casts GRANULAR CAST (*) NEGATIVE    POCT I-STAT 3, BLOOD GAS (G3P V)     Status: Abnormal   Collection Time   11/13/11 10:07 PM      Component Value Range Comment   pH, Ven 7.291  7.250 - 7.300     pCO2, Ven 40.4 (*) 45.0 - 50.0 (mmHg)    pO2, Ven 52.0 (*) 30.0 - 45.0 (mmHg)    Bicarbonate 19.5 (*) 20.0 - 24.0 (mEq/L)    TCO2 21  0 - 100 (mmol/L)    O2 Saturation 83.0      Acid-base deficit 7.0 (*) 0.0 - 2.0 (mmol/L)    Collection site BRACHIAL ARTERY      Sample type VENOUS     GLUCOSE, CAPILLARY     Status: Abnormal   Collection Time   11/13/11 10:23 PM      Component Value Range Comment   Glucose-Capillary 504 (*) 70 - 99 (mg/dL)    Comment 1 Notify RN     GLUCOSE, CAPILLARY     Status: Abnormal   Collection Time   11/13/11 11:36 PM      Component Value Range Comment   Glucose-Capillary 337 (*) 70 - 99 (mg/dL)    Comment 1 Notify RN     GLUCOSE, CAPILLARY     Status: Abnormal   Collection Time   11/14/11 12:16 AM      Component Value Range Comment   Glucose-Capillary 345 (*) 70 - 99 (mg/dL)    Comment 1 Notify RN      GLUCOSE, CAPILLARY     Status: Abnormal   Collection Time   11/14/11 12:50 AM      Component Value Range Comment   Glucose-Capillary 312 (*) 70 - 99 (mg/dL)    Comment 1 Notify RN      Dg Chest Port 1 View  11/14/2011  *RADIOLOGY REPORT*  Clinical Data: CHF, diabetes, hypertension, hyperglycemia, diabetic ketoacidosis, history coronary artery disease post MI  PORTABLE CHEST - 1 VIEW  Comparison: Portable exam 0042 hours compared to 10/23/2011  Findings: Enlargement of cardiac silhouette post CABG. Mediastinal contours and pulmonary vascularity normal. Lungs clear. No pleural effusion or pneumothorax. Bones appear demineralized. Numerous cardiac monitoring lines project over chest.  IMPRESSION: Enlargement of cardiac silhouette post CABG. No acute abnormalities.  Original Report Authenticated By: Burnetta Sabin, M.D.    ECG: NSR 90 bpm, LAD, normal P waves, Q waves inferior. poor RWP. No ST deviations. High lateral  TW inversions.   Impression Present on Admission:  .UTI (urinary tract infection) .Metabolic acidosis .DKA (diabetic ketoacidoses) .Systolic CHF, acute on chronic  60yoF with h/o diabetes and prior DKA, NSTEMI and cardiogenic shock in 08/2011, s/p CABG  09/27/2011, chronic sCHF 20-25% presents with DKA and UTI.   1. DKA: Mild at present, and pt appears quite well actually. Be gentle with IVF's given low EF.  - Now s/p 1L NS, will give 2nd L at 100/hr then STOP and continue glucose stabilizer. Holding home  subQ insulin.   2. UTI: UCx pending. Has dysuria but no fevers, no WBC count. Last UCx was pansensitive proteus in  07/2011.  - IV ceftriaxone for now   3. H/o chronic systolic CHF, NSTEMI s/p CABG:  No current cardiopulmonary complaints, ECG not  ischemic, and pt by exam appears euvolemic.  - CXR for baseline while giving IVF's, otherwise not active issue. Continue home coreg, enalapril,  crestor, plavix, ASA 325,   4. Continue home lexapro and  levothryoxine  Telemetry, [place] team 8  Presumed full code   Other plans as per orders.  Bridget Martinez 11/14/2011, 1:06 AM

## 2011-11-14 NOTE — Plan of Care (Signed)
Problem: Phase I Progression Outcomes Goal: Order "Choose to Live" book from Pharmacy Outcome: Completed/Met Date Met:  11/14/11 Living well with diabetes book order; did not see the choose to live book

## 2011-11-14 NOTE — Progress Notes (Signed)
Glucostabilizer stopped around 0640. MD notified. Ordered to give a snack and recheck cbg in about 30 mins.  Will continue to monitor the pt. Bridget Martinez

## 2011-11-14 NOTE — Progress Notes (Signed)
[facility] Inpatient Progress Note  11/14/2011  Subjective: Called to assess patient, nurse concerned about DKA orders, Pt doing well, She says she had been taking Levemir 13 units BID plus Novolog sliding scale, usually 5 units before meals plus supplement.  No chest pain or SOB.  She is having some nausea.  She was taken off IV insulin last night.  Transitioned with NPH 10 units.  Scheduled for Lantus  This morning.   Objective:  Vital signs in last 24 hours: Filed Vitals:   11/14/11 0143 11/14/11 0228 11/14/11 0400 11/14/11 0711  BP: 103/40 95/61 88/50  110/62  Pulse:  84 81   Temp: 98.6 F (37 C) 97.9 F (36.6 C) 97.9 F (36.6 C)   TempSrc: Oral Oral Oral   Resp: 20 18 18    Height:  5\' 3"  (1.6 m)    Weight:  66.588 kg (146 lb 12.8 oz)    SpO2: 99% 100% 97%    Weight change:  No intake or output data in the 24 hours ending 11/14/11 0913  Review of Systems Pertinent items are noted in HPI.  Physical Exam Gen - well developed, NAD HEENT - dry MM Lungs - BBS Clear CV - normal S1, s2 Abd - soft, +bS, ND, NT Ext - No cyanosis  Lab Results: Results for orders placed during the hospital encounter of 11/13/11 (from the past 24 hour(s))  GLUCOSE, CAPILLARY     Status: Abnormal   Collection Time   11/13/11  7:11 PM      Component Value Range   Glucose-Capillary 517 (*) 70 - 99 (mg/dL)   Comment 1 Documented in Chart     Comment 2 Notify RN    CBC     Status: Abnormal   Collection Time   11/13/11  8:34 PM      Component Value Range   WBC 8.0  4.0 - 10.5 (K/uL)   RBC 3.65 (*) 3.87 - 5.11 (MIL/uL)   Hemoglobin 11.2 (*) 12.0 - 15.0 (g/dL)   HCT 32.9 (*) 36.0 - 46.0 (%)   MCV 90.1  78.0 - 100.0 (fL)   MCH 30.7  26.0 - 34.0 (pg)   MCHC 34.0  30.0 - 36.0 (g/dL)   RDW 14.4  11.5 - 15.5 (%)   Platelets 367  150 - 400 (K/uL)  DIFFERENTIAL     Status: Normal   Collection Time   11/13/11  8:34 PM      Component Value Range   Neutrophils Relative 71  43 - 77 (%)   Neutro  Abs 5.7  1.7 - 7.7 (K/uL)   Lymphocytes Relative 20  12 - 46 (%)   Lymphs Abs 1.6  0.7 - 4.0 (K/uL)   Monocytes Relative 6  3 - 12 (%)   Monocytes Absolute 0.5  0.1 - 1.0 (K/uL)   Eosinophils Relative 2  0 - 5 (%)   Eosinophils Absolute 0.2  0.0 - 0.7 (K/uL)   Basophils Relative 1  0 - 1 (%)   Basophils Absolute 0.0  0.0 - 0.1 (K/uL)  BASIC METABOLIC PANEL     Status: Abnormal   Collection Time   11/13/11  8:34 PM      Component Value Range   Sodium 125 (*) 135 - 145 (mEq/L)   Potassium 4.4  3.5 - 5.1 (mEq/L)   Chloride 87 (*) 96 - 112 (mEq/L)   CO2 17 (*) 19 - 32 (mEq/L)   Glucose, Bld 563 (*) 70 - 99 (mg/dL)  BUN 24 (*) 6 - 23 (mg/dL)   Creatinine, Ser 1.02  0.50 - 1.10 (mg/dL)   Calcium 10.7 (*) 8.4 - 10.5 (mg/dL)   GFR calc non Af Amer 58 (*) >90 (mL/min)   GFR calc Af Amer 67 (*) >90 (mL/min)  POCT I-STAT TROPONIN I     Status: Normal   Collection Time   11/13/11  8:43 PM      Component Value Range   Troponin i, poc 0.01  0.00 - 0.08 (ng/mL)   Comment 3           GLUCOSE, CAPILLARY     Status: Abnormal   Collection Time   11/13/11  8:49 PM      Component Value Range   Glucose-Capillary 544 (*) 70 - 99 (mg/dL)   Comment 1 Documented in Chart     Comment 2 Call MD NNP PA CNM    URINALYSIS, ROUTINE W REFLEX MICROSCOPIC     Status: Abnormal   Collection Time   11/13/11  9:33 PM      Component Value Range   Color, Urine YELLOW  YELLOW    APPearance TURBID (*) CLEAR    Specific Gravity, Urine 1.030  1.005 - 1.030    pH 5.5  5.0 - 8.0    Glucose, UA >1000 (*) NEGATIVE (mg/dL)   Hgb urine dipstick SMALL (*) NEGATIVE    Bilirubin Urine NEGATIVE  NEGATIVE    Ketones, ur 40 (*) NEGATIVE (mg/dL)   Protein, ur 30 (*) NEGATIVE (mg/dL)   Urobilinogen, UA 0.2  0.0 - 1.0 (mg/dL)   Nitrite NEGATIVE  NEGATIVE    Leukocytes, UA MODERATE (*) NEGATIVE   URINE MICROSCOPIC-ADD ON     Status: Abnormal   Collection Time   11/13/11  9:33 PM      Component Value Range   Squamous Epithelial /  LPF MANY (*) RARE    WBC, UA TOO NUMEROUS TO COUNT  <3 (WBC/hpf)   RBC / HPF 3-6  <3 (RBC/hpf)   Bacteria, UA MANY (*) RARE    Casts GRANULAR CAST (*) NEGATIVE   POCT I-STAT 3, BLOOD GAS (G3P V)     Status: Abnormal   Collection Time   11/13/11 10:07 PM      Component Value Range   pH, Ven 7.291  7.250 - 7.300    pCO2, Ven 40.4 (*) 45.0 - 50.0 (mmHg)   pO2, Ven 52.0 (*) 30.0 - 45.0 (mmHg)   Bicarbonate 19.5 (*) 20.0 - 24.0 (mEq/L)   TCO2 21  0 - 100 (mmol/L)   O2 Saturation 83.0     Acid-base deficit 7.0 (*) 0.0 - 2.0 (mmol/L)   Collection site BRACHIAL ARTERY     Sample type VENOUS    GLUCOSE, CAPILLARY     Status: Abnormal   Collection Time   11/13/11 10:23 PM      Component Value Range   Glucose-Capillary 504 (*) 70 - 99 (mg/dL)   Comment 1 Notify RN    GLUCOSE, CAPILLARY     Status: Abnormal   Collection Time   11/13/11 11:36 PM      Component Value Range   Glucose-Capillary 337 (*) 70 - 99 (mg/dL)   Comment 1 Notify RN    BASIC METABOLIC PANEL     Status: Abnormal   Collection Time   11/14/11 12:10 AM      Component Value Range   Sodium 130 (*) 135 - 145 (mEq/L)   Potassium 3.7  3.5 - 5.1 (mEq/L)   Chloride 96  96 - 112 (mEq/L)   CO2 20  19 - 32 (mEq/L)   Glucose, Bld 351 (*) 70 - 99 (mg/dL)   BUN 21  6 - 23 (mg/dL)   Creatinine, Ser 0.96  0.50 - 1.10 (mg/dL)   Calcium 9.9  8.4 - 10.5 (mg/dL)   GFR calc non Af Amer 63 (*) >90 (mL/min)   GFR calc Af Amer 72 (*) >90 (mL/min)  GLUCOSE, CAPILLARY     Status: Abnormal   Collection Time   11/14/11 12:16 AM      Component Value Range   Glucose-Capillary 345 (*) 70 - 99 (mg/dL)   Comment 1 Notify RN    GLUCOSE, CAPILLARY     Status: Abnormal   Collection Time   11/14/11 12:50 AM      Component Value Range   Glucose-Capillary 312 (*) 70 - 99 (mg/dL)   Comment 1 Notify RN    GLUCOSE, CAPILLARY     Status: Abnormal   Collection Time   11/14/11  2:05 AM      Component Value Range   Glucose-Capillary 220 (*) 70 - 99 (mg/dL)    Comment 1 Notify RN    BASIC METABOLIC PANEL     Status: Abnormal   Collection Time   11/14/11  2:32 AM      Component Value Range   Sodium 135  135 - 145 (mEq/L)   Potassium 2.9 (*) 3.5 - 5.1 (mEq/L)   Chloride 100  96 - 112 (mEq/L)   CO2 23  19 - 32 (mEq/L)   Glucose, Bld 174 (*) 70 - 99 (mg/dL)   BUN 19  6 - 23 (mg/dL)   Creatinine, Ser 0.94  0.50 - 1.10 (mg/dL)   Calcium 10.0  8.4 - 10.5 (mg/dL)   GFR calc non Af Amer 64 (*) >90 (mL/min)   GFR calc Af Amer 74 (*) >90 (mL/min)  CBC     Status: Abnormal   Collection Time   11/14/11  2:32 AM      Component Value Range   WBC 6.9  4.0 - 10.5 (K/uL)   RBC 3.25 (*) 3.87 - 5.11 (MIL/uL)   Hemoglobin 9.9 (*) 12.0 - 15.0 (g/dL)   HCT 29.0 (*) 36.0 - 46.0 (%)   MCV 89.2  78.0 - 100.0 (fL)   MCH 30.5  26.0 - 34.0 (pg)   MCHC 34.1  30.0 - 36.0 (g/dL)   RDW 14.3  11.5 - 15.5 (%)   Platelets 338  150 - 400 (K/uL)  GLUCOSE, CAPILLARY     Status: Abnormal   Collection Time   11/14/11  2:40 AM      Component Value Range   Glucose-Capillary 158 (*) 70 - 99 (mg/dL)   Comment 1 Notify RN    BASIC METABOLIC PANEL     Status: Abnormal   Collection Time   11/14/11  4:30 AM      Component Value Range   Sodium 137  135 - 145 (mEq/L)   Potassium 3.5  3.5 - 5.1 (mEq/L)   Chloride 101  96 - 112 (mEq/L)   CO2 24  19 - 32 (mEq/L)   Glucose, Bld 143 (*) 70 - 99 (mg/dL)   BUN 18  6 - 23 (mg/dL)   Creatinine, Ser 0.94  0.50 - 1.10 (mg/dL)   Calcium 9.4  8.4 - 10.5 (mg/dL)   GFR calc non Af Amer 64 (*) >90 (mL/min)  GFR calc Af Amer 74 (*) >90 (mL/min)  GLUCOSE, CAPILLARY     Status: Abnormal   Collection Time   11/14/11  4:31 AM      Component Value Range   Glucose-Capillary 141 (*) 70 - 99 (mg/dL)   Comment 1 Notify RN    GLUCOSE, CAPILLARY     Status: Normal   Collection Time   11/14/11  5:42 AM      Component Value Range   Glucose-Capillary 85  70 - 99 (mg/dL)  GLUCOSE, CAPILLARY     Status: Abnormal   Collection Time   11/14/11  6:32 AM       Component Value Range   Glucose-Capillary 68 (*) 70 - 99 (mg/dL)  BASIC METABOLIC PANEL     Status: Abnormal   Collection Time   11/14/11  6:40 AM      Component Value Range   Sodium 136  135 - 145 (mEq/L)   Potassium 3.5  3.5 - 5.1 (mEq/L)   Chloride 102  96 - 112 (mEq/L)   CO2 23  19 - 32 (mEq/L)   Glucose, Bld 67 (*) 70 - 99 (mg/dL)   BUN 18  6 - 23 (mg/dL)   Creatinine, Ser 0.91  0.50 - 1.10 (mg/dL)   Calcium 9.3  8.4 - 10.5 (mg/dL)   GFR calc non Af Amer 67 (*) >90 (mL/min)   GFR calc Af Amer 77 (*) >90 (mL/min)  GLUCOSE, CAPILLARY     Status: Abnormal   Collection Time   11/14/11  7:46 AM      Component Value Range   Glucose-Capillary 184 (*) 70 - 99 (mg/dL)    Micro Results: No results found for this or any previous visit (from the past 240 hour(s)).  Studies/Results: Dg Chest Port 1 View  11/14/2011  *RADIOLOGY REPORT*  Clinical Data: CHF, diabetes, hypertension, hyperglycemia, diabetic ketoacidosis, history coronary artery disease post MI  PORTABLE CHEST - 1 VIEW  Comparison: Portable exam 0042 hours compared to 10/23/2011  Findings: Enlargement of cardiac silhouette post CABG. Mediastinal contours and pulmonary vascularity normal. Lungs clear. No pleural effusion or pneumothorax. Bones appear demineralized. Numerous cardiac monitoring lines project over chest.  IMPRESSION: Enlargement of cardiac silhouette post CABG. No acute abnormalities.  Original Report Authenticated By: Burnetta Sabin, M.D.    Medications:  Scheduled Meds:   . aspirin  325 mg Oral Daily  . atorvastatin  80 mg Oral q1800  . carvedilol  3.125 mg Oral BID WC  . cefTRIAXone (ROCEPHIN)  IV  1 g Intravenous Once  . cefTRIAXone (ROCEPHIN)  IV  1 g Intravenous Q24H  . clopidogrel  75 mg Oral Q breakfast  . enalapril  2.5 mg Oral BID  . escitalopram  10 mg Oral Daily  . heparin  5,000 Units Subcutaneous Q8H  . insulin aspart  0-9 Units Subcutaneous TID WC  . insulin aspart  4 Units Subcutaneous TID WC  .  insulin glargine  12 Units Subcutaneous Daily  . insulin NPH  10 Units Subcutaneous Once  . insulin (NOVOLIN-R) infusion   Intravenous To Major  . levothyroxine  100 mcg Oral Q0600  . living well with diabetes book   Does not apply Once  . potassium chloride  10 mEq Oral BID  . potassium chloride  10 mEq Intravenous Q1H  . sodium chloride  1,000 mL Intravenous Once  . DISCONTD: sodium chloride   Intravenous Once  . DISCONTD: insulin glargine  5 Units Subcutaneous Daily  Continuous Infusions:   . dextrose 5 % and 0.45% NaCl 75 mL/hr at 11/14/11 0234  . DISCONTD: sodium chloride Stopped (11/14/11 0208)  . DISCONTD: dextrose 5 % and 0.45% NaCl    . DISCONTD: insulin (NOVOLIN-R) infusion     PRN Meds:.dextrose, dextrose, dextrose, dextrose, glucagon, glucagon, DISCONTD: dextrose  Assessment/Plan:  DKA (diabetic ketoacidoses)  - starting basal bolus lantus and novolog, Lantus 12 units now, novolog 4 units TIDWC plus supplemental novolog to correct high BS readings.  Systolic CHF, acute on chronic (07/27/2011)  -decrease rate of D51/2NS to 50 mL/o  UTI (urinary tract infection) - continue rocephin IV, following urine culture   Metabolic acidosis (Q000111Q)   Currently resolved, follow BMP  Hypokalemia - currently resolved, continue to follow.   LOS: 1 day   Anyra Kaufman 11/14/2011, 9:13 AM   Murlean Iba, MD, CDE, [place], [place]

## 2011-11-14 NOTE — Progress Notes (Signed)
Inpatient Diabetes Program Recommendations  AACE/ADA: New Consensus Statement on Inpatient Glycemic Control (2009)  Target Ranges:  Prepandial:   less than 140 mg/dL      Peak postprandial:   less than 180 mg/dL (1-2 hours)      Critically ill patients:  140 - 180 mg/dL   Reason for Visit: Note patient admitted with DKA. She has had diabetes since age 61.  She recently started seeing Dr. Kerr-endocrinologist.  States that she has UTI which she thinks made her CBG's increase.  States she has no questions at this time.  Will need follow up with Dr. Buddy Duty.

## 2011-11-14 NOTE — Progress Notes (Signed)
Pt's cbg was 68 at 0630. Md on call was notified. Orders were to give he pt a snack and recheck her cbg in 30 mins. Pt was given 236 ml of orange juice at 0655. Will continue to monitor the pt. Hoover Brunette

## 2011-11-14 NOTE — Progress Notes (Signed)
Nutrition Brief Note  RD pulled to pt due to unintentional weight loss > 10 lbs within the past month trigger per admission nutrition screen. Pt reports this weight loss was due to fluid. States she did have a nausea spell this AM and did not consume breakfast, however, she feels better at present and will be "fine" at lunch today. BMI = 27.6 kg/m2. No nutrition intervention indicated at this time. Please consult RD as needed.  Lady Deutscher, RD, LDN Pager #: [phone]

## 2011-11-15 LAB — COMPREHENSIVE METABOLIC PANEL
ALT: 10 U/L (ref 0–35)
Albumin: 2.9 g/dL — ABNORMAL LOW (ref 3.5–5.2)
Alkaline Phosphatase: 100 U/L (ref 39–117)
Calcium: 9.5 mg/dL (ref 8.4–10.5)
Potassium: 4 mEq/L (ref 3.5–5.1)
Sodium: 135 mEq/L (ref 135–145)
Total Protein: 6 g/dL (ref 6.0–8.3)

## 2011-11-15 LAB — CBC
MCHC: 33.5 g/dL (ref 30.0–36.0)
Platelets: 310 10*3/uL (ref 150–400)
RDW: 15 % (ref 11.5–15.5)

## 2011-11-15 LAB — MAGNESIUM: Magnesium: 1.8 mg/dL (ref 1.5–2.5)

## 2011-11-15 MED ORDER — CIPROFLOXACIN HCL 500 MG PO TABS: 500.0000 mg | ORAL_TABLET | Freq: Two times a day (BID) | ORAL | Status: DC

## 2011-11-15 MED ORDER — INSULIN DETEMIR 100 UNIT/ML ~~LOC~~ SOLN: 16.0000 [IU] | Freq: Two times a day (BID) | SUBCUTANEOUS | Status: DC

## 2011-11-15 MED ORDER — POTASSIUM CHLORIDE CRYS ER 10 MEQ PO TBCR: 10.0000 meq | EXTENDED_RELEASE_TABLET | Freq: Two times a day (BID) | ORAL | Status: DC

## 2011-11-15 NOTE — Discharge Instructions (Signed)
Diabetic Ketoacidosis Diabetic ketoacidosis (DKA) is a life-threatening complication of type 1 diabetes. It must be quickly recognized and treated. Treatment requires hospitalization. CAUSES  When there is no insulin in the body, glucose (sugar) cannot be used and the body breaks down fat for energy. When fat breaks down, acids (ketones) build up in the blood. Very high levels of glucose and high levels of acids lead to severe loss of body fluids (dehydration) and other dangerous chemical changes. This stresses your vital organs and can cause coma or death. SYMPTOMS   Tiredness (fatigue).   Weight loss.   Excessive thirst.   Ketones in the urine.   Lightheadedness.   Fruity or sweet smell on your breath.   Excessive urination.   Visual changes.   Confusion or irritability.   Feeling sick to your stomach (nauseous) or vomiting.   Rapid breathing.   Stomachache or belly (abdominal) pain.  DIAGNOSIS  Your caregiver will diagnose DKA based on your history, physical exam, and blood tests. Your caregiver will check if there is another illness present which caused you to go into DKA. Most of this will be done quickly in an emergency room. TREATMENT   Fluid replacement to correct dehydration.   Insulin.   Correction of electrolytes, such as potassium and sodium.   Medicines (antibiotics) that kill germs for infections.  PREVENTION  Always take your insulin. Do not skip your insulin injections.   If you are ill, treat yourself quickly. Your body often needs more insulin to fight the illness.   Check your blood glucose regularly.   Check urine ketones if your blood glucose is greater than 240 milligrams per deciliter (mg/dl).   Do not used expired or outdated insulin.   If your blood glucose is high, drink plenty of fluids. This helps flush out ketones.  HOME CARE INSTRUCTIONS   If you are ill, follow the advice of your caregiver.   To prevent loss of body fluids  (dehydration), drink enough water and fluids to keep your urine clear or pale yellow.   If you cannot eat, alternate between drinking fluids with sugar (soda, juices, flavored gelatin) and salty fluids (broth, bouillon).   If you can eat, follow your usual diet and drink sugar-free liquids (water, diet drinks).   Always take your usual dose of insulin. If you cannot eat, or your glucose is getting too low, call your caregiver for further instructions.   Continue to monitor your blood or urine ketones every 3 to 4 hours around the clock. Set your alarm clock or have someone wake you up. If you are too sick, have someone test it for you.   Rest and avoid exercise.  SEEK MEDICAL CARE IF:   You have ketones in your urine or your blood glucose is higher than a level your caregiver suggests. You may need extra insulin. Call your caregiver if you need advice on adjusting your insulin.   You cannot drink at least a tablespoon of fluid every 15 to 20 minutes.   You have been throwing up for more than 2 hours.   You have symptoms of DKA:   Fruity smelling breath.   Breathing faster or slower.   Becoming very sleepy.  SEEK IMMEDIATE MEDICAL CARE IF:   You have signs of dehydration:   Decreased urination.   Increased thirst.   Dry skin and mouth.   Lightheadedness.   Your blood glucose is very high (as advised by your caregiver) twice in a row.  You or your child has an oral temperature above 102 F (38.9 C), not controlled by medicine.   You pass out.   You have chest pain and/or trouble breathing.   You have a sudden, severe headache.   You have sudden weakness in one arm and/or one leg.   You have sudden difficulty speaking and/or swallowing.   You develop vomiting and/or diarrhea that is getting worse after 3 to 4 hours.   You have abdominal pain.  MAKE SURE YOU:   Understand these instructions.   Will watch your condition.   Will get help right away if you are  not doing well or get worse.  Document Released: 08/23/2000 Document Revised: 08/15/2011 Document Reviewed: 03/01/2009 [facility] Patient Information 2012 [place]. Hyperglycemia Hyperglycemia occurs when the glucose (sugar) in your blood is too high. Hyperglycemia can happen for many reasons, but it most often happens to people who do not know they have diabetes or are not managing their diabetes properly.  CAUSES  Whether you have diabetes or not, there are other causes of hyperglycemia. Hyperglycemia can occur when you have diabetes, but it can also occur in other situations that you might not be as aware of, such as: Diabetes  If you have diabetes and are having problems controlling your blood glucose, hyperglycemia could occur because of some of the following reasons:   Not following your meal plan.   Not taking your diabetes medications or not taking it properly.   Exercising less or doing less activity than you normally do.   Being sick.  Pre-diabetes  This cannot be ignored. Before people develop Type 2 diabetes, they almost always have "pre-diabetes." This is when your blood glucose levels are higher than normal, but not yet high enough to be diagnosed as diabetes. Research has shown that some long-term damage to the body, especially the heart and circulatory system, may already be occurring during pre-diabetes. If you take action to manage your blood glucose when you have pre-diabetes, you may delay or prevent Type 2 diabetes from developing.  Stress  If you have diabetes, you may be "diet" controlled or on oral medications or insulin to control your diabetes. However, you may find that your blood glucose is higher than usual in the hospital whether you have diabetes or not. This is often referred to as "stress hyperglycemia." Stress can elevate your blood glucose. This happens because of hormones put out by the body during times of stress. If stress has been the cause of your  high blood glucose, it can be followed regularly by your caregiver. That way he/she can make sure your hyperglycemia does not continue to get worse or progress to diabetes.  Steroids  Steroids are medications that act on the infection fighting system (immune system) to block inflammation or infection. One side effect can be a rise in blood glucose. Most people can produce enough extra insulin to allow for this rise, but for those who cannot, steroids make blood glucose levels go even higher. It is not unusual for steroid treatments to "uncover" diabetes that is developing. It is not always possible to determine if the hyperglycemia will go away after the steroids are stopped. A special blood test called an A1c is sometimes done to determine if your blood glucose was elevated before the steroids were started.  SYMPTOMS  Thirsty.   Frequent urination.   Dry mouth.   Blurred vision.   Tired or fatigue.   Weakness.   Sleepy.  Tingling in feet or leg.  DIAGNOSIS  Diagnosis is made by monitoring blood glucose in one or all of the following ways:  A1c test. This is a chemical found in your blood.   Fingerstick blood glucose monitoring.   Laboratory results.  TREATMENT  First, knowing the cause of the hyperglycemia is important before the hyperglycemia can be treated. Treatment may include, but is not be limited to:  Education.   Change or adjustment in medications.   Change or adjustment in meal plan.   Treatment for an illness, infection, etc.   More frequent blood glucose monitoring.   Change in exercise plan.   Decreasing or stopping steroids.   Lifestyle changes.  HOME CARE INSTRUCTIONS   Test your blood glucose as directed.   Exercise regularly. Your caregiver will give you instructions about exercise. Pre-diabetes or diabetes which comes on with stress is helped by exercising.   Eat wholesome, balanced meals. Eat often and at regular, fixed times. Your caregiver  or nutritionist will give you a meal plan to guide your sugar intake.   Being at an ideal weight is important. If needed, losing as little as 10 to 15 pounds may help improve blood glucose levels.  SEEK MEDICAL CARE IF:   You have questions about medicine, activity, or diet.   You continue to have symptoms (problems such as increased thirst, urination, or weight gain).  SEEK IMMEDIATE MEDICAL CARE IF:   You are vomiting or have diarrhea.   Your breath smells fruity.   You are breathing faster or slower.   You are very sleepy or incoherent.   You have numbness, tingling, or pain in your feet or hands.   You have chest pain.   Your symptoms get worse even though you have been following your caregiver's orders.   If you have any other questions or concerns.  Document Released: 02/19/2001 Document Revised: 08/15/2011 Document Reviewed: 04/17/2009 [facility] Patient Information 2012 [place].  Urinary Tract Infection Infections of the urinary tract can start in several places. A bladder infection (cystitis), a kidney infection (pyelonephritis), and a prostate infection (prostatitis) are different types of urinary tract infections (UTIs). They usually get better if treated with medicines (antibiotics) that kill germs. Take all the medicine until it is gone. You or your child may feel better in a few days, but TAKE ALL MEDICINE or the infection may not respond and may become more difficult to treat. HOME CARE INSTRUCTIONS   Drink enough water and fluids to keep the urine clear or pale yellow. Cranberry juice is especially recommended, in addition to large amounts of water.   Avoid caffeine, tea, and carbonated beverages. They tend to irritate the bladder.   Alcohol may irritate the prostate.   Only take over-the-counter or prescription medicines for pain, discomfort, or fever as directed by your caregiver.  To prevent further infections:  Empty the bladder often. Avoid holding  urine for long periods of time.   After a bowel movement, women should cleanse from front to back. Use each tissue only once.   Empty the bladder before and after sexual intercourse.  FINDING OUT THE RESULTS OF YOUR TEST Not all test results are available during your visit. If your or your child's test results are not back during the visit, make an appointment with your caregiver to find out the results. Do not assume everything is normal if you have not heard from your caregiver or the medical facility. It is important for you to  follow up on all test results. SEEK MEDICAL CARE IF:   There is back pain.   Your baby is older than 3 months with a rectal temperature of 100.5 F (38.1 C) or higher for more than 1 day.   Your or your child's problems (symptoms) are no better in 3 days. Return sooner if you or your child is getting worse.  SEEK IMMEDIATE MEDICAL CARE IF:   There is severe back pain or lower abdominal pain.   You or your child develops chills.   You have a fever.   Your baby is older than 3 months with a rectal temperature of 102 F (38.9 C) or higher.   Your baby is 1 months old or younger with a rectal temperature of 100.4 F (38 C) or higher.   There is nausea or vomiting.   There is continued burning or discomfort with urination.  MAKE SURE YOU:   Understand these instructions.   Will watch your condition.   Will get help right away if you are not doing well or get worse.  Document Released: 06/05/2005 Document Revised: 08/15/2011 Document Reviewed: 01/08/2007 [facility] Patient Information 2012 [place].

## 2011-11-15 NOTE — Progress Notes (Signed)
Utilization review complete 

## 2011-11-15 NOTE — ED Provider Notes (Signed)
I saw and evaluated the patient, reviewed the resident's note and I agree with the findings and plan.  Leota Jacobsen, MD 11/15/11 [phone]

## 2011-11-15 NOTE — Discharge Summary (Signed)
HOSPITAL DISCHARGE SUMMARY   @n   KIAMESHA HOLIHAN, 61 y.o., DOB 01/06/1951, MRN [id number]  Admission date: 11/13/2011 Discharge Date: 11/15/2011  Primary MD [name], MD, MD Endocrinologist:  Dr. Buddy Duty  Admitting Physician Ria Bush, MD  Admission Diagnosis  UTI (urinary tract infection) [id number] Metabolic acidosis 123456 Hyperglycemia [790.29] blood sugar high  Discharge Diagnoses:   Active Hospital Problems  Diagnoses Date Noted   . Systolic CHF, acute on chronic 07/27/2011     Resolved Hospital Problems  Diagnoses Date Noted Date Resolved  . DKA (diabetic ketoacidoses) 11/14/2011 11/15/2011  . UTI (urinary tract infection) 11/14/2011 11/15/2011  . Metabolic acidosis 123456 11/15/2011    Past Medical History  Diagnosis Date  . Diabetes mellitus     on insulin, with h/o DKA   . HTN (hypertension)   . Hyperlipidemia   . Venous stasis ulcers   . Angina   . Hypothyroidism   . GERD (gastroesophageal reflux disease)   . Arthritis   . Anxiety   . Depression   . 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 Coronary artery disease   . Heart murmur     Dr Haroldine Laws cardiologist    Past Surgical History  Procedure Date  . Cardiac catheterization   . Tonsillectomy   . Coronary artery bypass graft     Dr. Prescott Gum in 09/2011     Hospital Course See H&P, Labs, Consult and Test reports for all details. In brief, this patient was admitted for DKA and UTI.  She was started on DKA protocol and responded very well and anion gap improved and quickly returned to normal.  She was treated with ceftriaxone IV for the UTI but the urine culture did not grow out anything. She will be sent home on a 5 day course of cipro and some supplemental potassium.  She had labile blood sugars in the hospital.  She started eating very well and reported that she has  Been working with her endocrinologist to fine tune her glycemic control regimen.  She  will be sent home on slightly higher doses of total insulin with instructions to call her endocrinologist and PCP to report her blood sugars and have her insulin doses adjusted.    Active Hospital Problems  Diagnoses Date Noted   . Systolic CHF, acute on chronic 07/27/2011     Resolved Hospital Problems  Diagnoses Date Noted Date Resolved  . DKA (diabetic ketoacidoses) 11/14/2011 11/15/2011  . UTI (urinary tract infection) 11/14/2011 11/15/2011  . Metabolic acidosis 123456 11/15/2011     Today's Assessment:   Subjective:   Joellyn Haff  Pt eating breakfast with no problems or complaints.     Objective:   Blood pressure 106/67, pulse 101, temperature 97.5 F (36.4 C), temperature source Oral, resp. rate 18, height 5\' 3"  (1.6 m), weight 66.588 kg (146 lb 12.8 oz), SpO2 96.00%.  Intake/Output Summary (Last 24 hours) at 11/15/11 0823 Last data filed at 11/14/11 1900  Gross per 24 hour  Intake 1463.33 ml  Output      0 ml  Net 1463.33 ml    Exam General - awake, alert, NAD, calm, cooperative HEENT - NCAT, MMM, neck supple, thyroid soft, no masses palpated Lungs - BBS CTA CV - normal S1, S2 Abd - soft, nontender, nondistended, no masses palpated Ext - no cyanosis    Cultures - urine culture no growth   Lab Results  Component Value Date  WBC 6.2 11/15/2011   HGB 9.4* 11/15/2011   HCT 28.1* 11/15/2011   PLT 310 11/15/2011   LYMPHOPCT 20 11/13/2011   MONOPCT 6 11/13/2011   EOSPCT 2 11/13/2011   BASOPCT 1 11/13/2011   CMP: Lab Results  Component Value Date   NA 135 11/15/2011   K 4.0 11/15/2011   CL 102 11/15/2011   CO2 23 11/15/2011   BUN 14 11/15/2011   CREATININE 1.07 11/15/2011   PROT 6.0 11/15/2011   ALBUMIN 2.9* 11/15/2011   BILITOT 0.3 11/15/2011   ALKPHOS 100 11/15/2011   AST 16 11/15/2011   ALT 10 11/15/2011  .  Discharge Instructions     Check BS 4x / day Call BS results to endocrinologist/PCP in 3 days and have insulin doses adjusted Return if symptoms recur, worsen or new  changes develop.  DISCHARGE MEDICATION: Medication List  As of 11/15/2011  8:23 AM   STOP taking these medications         furosemide 20 MG tablet         TAKE these medications         aspirin 325 MG tablet   Take 325 mg by mouth daily.      carvedilol 6.25 MG tablet   Commonly known as: COREG   Take 3.125 mg by mouth 2 (two) times daily with a meal.      ciprofloxacin 500 MG tablet   Commonly known as: CIPRO   Take 1 tablet (500 mg total) by mouth 2 (two) times daily.      clopidogrel 75 MG tablet   Commonly known as: PLAVIX   Take 75 mg by mouth daily with breakfast.      enalapril 2.5 MG tablet   Commonly known as: VASOTEC   Take 2.5 mg by mouth 2 (two) times daily.      escitalopram 10 MG tablet   Commonly known as: LEXAPRO   Take 10 mg by mouth daily.      insulin aspart 100 UNIT/ML injection   Commonly known as: novoLOG   Inject 5-10 Units into the skin 3 (three) times daily with meals. If blood glucose 70-140 mg/dL, then 3 units.  If blood glucose 141-180 mg/dL, then 4 units. If blood glucose 181-220 mg/dL, then 5 units. If blood glucose 221-260 mg/dL, then 6 units. If blood glucose 261-300 mg/dL, then 7 units. If blood glucose 301-340 mg/dL, then 8 units. If blood glucose 341-380 mg/dL, then 9 units. If blood glucose is greater than 381 then 10 units.      insulin detemir 100 UNIT/ML injection   Commonly known as: LEVEMIR   Inject 16 Units into the skin 2 (two) times daily.      levothyroxine 100 MCG tablet   Commonly known as: SYNTHROID, LEVOTHROID   Take 100 mcg by mouth daily.      pantoprazole 40 MG tablet   Commonly known as: PROTONIX   Take 1 tablet (40 mg total) by mouth 2 (two) times daily.      potassium chloride 10 MEQ tablet   Commonly known as: K-DUR,KLOR-CON   Take 1 tablet (10 mEq total) by mouth 2 (two) times daily.      rosuvastatin 40 MG tablet   Commonly known as: CRESTOR   Take 40 mg by mouth every evening.             Disposition and Follow-up: Discharge Orders    Future Appointments: Provider: Department: Dept Phone: Center:   11/21/2011 10:00  AM [facility]  None   11/21/2011 11:00 AM [facility] [phone] None     Future Orders Please Complete By Expires   Diet - low sodium heart healthy      Scheduling Instructions:   Diabetic friendly diet recommended   Increase activity slowly      Discharge instructions      Comments:   Please check blood sugars 4 times per day and report them to your endocrinologist and/or primary care provider in 3 days Please use your sliding scale to correct high blood sugars. Return if symptoms recur, worsen or new changes develop.  Please follow up with Dr. Buddy Duty next week.     Call MD for:      Scheduling Instructions:   Uncontrolled blood sugars greater than 300   Call MD for:  persistant nausea and vomiting      Call MD for:  severe uncontrolled pain      Call MD for:  extreme fatigue        Follow-up Information    Follow up with KERR,JEFFREY, MD in 1 week. (call and report your blood sugars to the office in 2 days)    Contact information:   [address] [place] [facility] [place] [phone]       Follow up with [facility], MD in 10 days.         The risks, benefits, and possible side effects of all treatments and tests were explained to the patient.  The patient verbalized understanding.  The importance of close follow up with the primary care medical provider was explained clearly to the patient.  The patient verbalized understanding.  The patient was given instructions to return if symptoms recur, worsen or new changes develop.  The patient verbalized understanding.   Murlean Iba, MD, CDE, [place], [place]   Total Time spent reviewing critical document, reviewing this patient's comprehensive  hospitalization, arranging follow up and coordination of care, reviewing data and todays exam greater than 35 minutes.   Signed: Tateanna Bach 11/15/2011 8:23 AM [id number]

## 2011-11-15 NOTE — Progress Notes (Signed)
Pt discharged to home per MD order.  Pt received all discharge instructions and medication information, including prescriptions and follow up appointment information. Bridget Martinez

## 2011-11-21 ENCOUNTER — Ambulatory Visit (HOSPITAL_COMMUNITY)
Admission: RE | Admit: 2011-11-21 | Discharge: 2011-11-21 | Disposition: A | Discharge status: Home / Self Care | Payer: BC Managed Care – PPO | Source: Ambulatory Visit | Attending: Internal Medicine | Admitting: Internal Medicine

## 2011-11-21 ENCOUNTER — Ambulatory Visit (HOSPITAL_COMMUNITY)
Admission: RE | Admit: 2011-11-21 | Discharge: 2011-11-21 | Disposition: A | Discharge status: Home / Self Care | Payer: BC Managed Care – PPO | Source: Ambulatory Visit | Attending: Adult Health | Admitting: Adult Health

## 2011-11-21 VITALS — BP 90/50 | HR 80 | Ht 63.0 in | Wt 151.0 lb

## 2011-11-21 DIAGNOSIS — I379 Nonrheumatic pulmonary valve disorder, unspecified: Secondary | ICD-10-CM | POA: Insufficient documentation

## 2011-11-21 DIAGNOSIS — R011 Cardiac murmur, unspecified: Secondary | ICD-10-CM | POA: Insufficient documentation

## 2011-11-21 DIAGNOSIS — I5022 Chronic systolic (congestive) heart failure: Secondary | ICD-10-CM

## 2011-11-21 DIAGNOSIS — I059 Rheumatic mitral valve disease, unspecified: Secondary | ICD-10-CM

## 2011-11-21 DIAGNOSIS — I080 Rheumatic disorders of both mitral and aortic valves: Secondary | ICD-10-CM | POA: Insufficient documentation

## 2011-11-21 DIAGNOSIS — I10 Essential (primary) hypertension: Secondary | ICD-10-CM | POA: Insufficient documentation

## 2011-11-21 DIAGNOSIS — I079 Rheumatic tricuspid valve disease, unspecified: Secondary | ICD-10-CM | POA: Insufficient documentation

## 2011-11-21 DIAGNOSIS — I5023 Acute on chronic systolic (congestive) heart failure: Secondary | ICD-10-CM

## 2011-11-21 DIAGNOSIS — I2510 Atherosclerotic heart disease of native coronary artery without angina pectoris: Secondary | ICD-10-CM

## 2011-11-21 DIAGNOSIS — E119 Type 2 diabetes mellitus without complications: Secondary | ICD-10-CM | POA: Insufficient documentation

## 2011-11-21 DIAGNOSIS — E785 Hyperlipidemia, unspecified: Secondary | ICD-10-CM | POA: Insufficient documentation

## 2011-11-21 NOTE — Assessment & Plan Note (Addendum)
NYHA II. Volume status appears stable. SBP remains soft will not titrate medications. ECHO repeated. EF improved 35-40%. Discussed sliding scale Lasix and she is instructed to take Lasix 20 mg if her weight is 153 pounds or greater. Follow up in 2 months.

## 2011-11-21 NOTE — Assessment & Plan Note (Signed)
No evidence of ischemia. Continue current regimen. Cardiac rehab pending.

## 2011-11-21 NOTE — Progress Notes (Signed)
Patient ID: Bridget Martinez, female   DOB: 08-May-1951, 61 y.o.   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 Admitted with NSTEMI and cardiogenic shock in 12/12.Required inotropic support and 50 pound diuresis. CABG x 4 in January 2013 by Dr. Prescott Gum.  Discharged from [facility] 10/11/11 after she was treated for  volume depletion, hypotension and UTI.   Discharged from [facility] 11/15/11 after she was treated for  UTI and Hyperglycemia. Insulin was adjusted.   ECHO 11/21/11 35-40%  She is here for post hospitalization follow up. Denies SOB/PND/CP/Orthopnea/dizziness. Denies nausea. Occasionally dizzy when she stands.  Complaint with all medications. She has not required any lasix. Weight at home 146-150. She is limiting fluid intake to 2 liters daily. Appetite continues to improve. She will start cardiac rehab next week.    ROS: All systems negative except as listed in HPI, PMH and Problem List.  Past Medical History  Diagnosis Date  . Diabetes mellitus     on insulin, with h/o DKA   . HTN (hypertension)   . Hyperlipidemia   . Venous stasis ulcers   . Angina   . Hypothyroidism   . GERD (gastroesophageal reflux disease)   . Arthritis   . Anxiety   . Depression   . 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 Coronary artery disease   . Heart murmur     Dr Haroldine Laws cardiologist    Current Outpatient Prescriptions  Medication Sig Dispense Refill  . aspirin 325 MG tablet Take 325 mg by mouth daily.        . carvedilol (COREG) 6.25 MG tablet Take 3.125 mg by mouth 2 (two) times daily with a meal.      . clopidogrel (PLAVIX) 75 MG tablet Take 75 mg by mouth daily with breakfast.      . enalapril (VASOTEC) 2.5 MG tablet Take 2.5 mg by mouth 2 (two) times daily.      Marland Kitchen escitalopram (LEXAPRO) 10 MG tablet Take 10 mg by mouth daily.         . insulin aspart (NOVOLOG) 100 UNIT/ML injection Inject 5-10 Units into the skin 3 (three) times daily with meals. If blood glucose 70-140 mg/dL, then 3 units.  If blood glucose 141-180 mg/dL, then 4 units. If blood glucose 181-220 mg/dL, then 5 units. If blood glucose 221-260 mg/dL, then 6 units. If blood glucose 261-300 mg/dL, then 7 units. If blood glucose 301-340 mg/dL, then 8 units. If blood glucose 341-380 mg/dL, then 9 units. If blood glucose is greater than 381 then 10 units.       . insulin detemir (LEVEMIR) 100 UNIT/ML injection Inject 16 Units into the skin 2 (two) times daily.  10 mL  0  . levothyroxine (SYNTHROID, LEVOTHROID) 100 MCG tablet Take 100 mcg by mouth daily.        . pantoprazole (PROTONIX) 40 MG tablet Take 1 tablet (40 mg total) by mouth 2 (two) times daily.  60 tablet  6  . rosuvastatin (CRESTOR) 40 MG tablet Take 40 mg by mouth every evening.         PHYSICAL EXAM: Filed Vitals:   11/21/11 1123  BP: 90/50  Pulse: 80   Weight change:   151 (152) General:  Well appearing. No resp difficulty HEENT: normal Neck: supple. JVP 5-6. Carotids  2+ bilaterally; no bruits. No lymphadenopathy or thryomegaly appreciated. Cor: PMI normal. Regular rate & rhythm. No rubs, gallops or murmurs. Lungs: clear Abdomen: soft, nontender, nondistended. No hepatosplenomegaly. No bruits or masses. Good bowel sounds. Extremities: no cyanosis, clubbing, rash, edema Neuro: alert & orientedx3, cranial nerves grossly intact. Moves all 4 extremities w/o difficulty. Affect pleasant.     ASSESSMENT & PLAN:

## 2011-11-21 NOTE — Patient Instructions (Addendum)
Take lasix 20 mg po if your weight is 153 pounds or greater.  Follow up in 2 months  Do the following things EVERYDAY: 1) Weigh yourself in the morning before breakfast. Write it down and keep it in a log. 2) Take your medicines as prescribed 3) Eat low salt foods--Limit salt (sodium) to 2000mg  per day.  4) Stay as active as you can everyday

## 2011-11-21 NOTE — Progress Notes (Signed)
*  PRELIMINARY RESULTS* Echocardiogram 2D Echocardiogram has been performed.  Bridget Martinez R 11/21/2011, 11:13 AM

## 2011-11-22 ENCOUNTER — Telehealth (HOSPITAL_COMMUNITY): Payer: Self-pay | Admitting: Adult Health

## 2011-11-22 NOTE — Telephone Encounter (Signed)
ECHO results provided . EF improved EF 35-40%

## 2011-11-26 ENCOUNTER — Other Ambulatory Visit: Payer: Self-pay

## 2011-11-27 MED ORDER — PANTOPRAZOLE SODIUM 40 MG PO TBEC: 40.0000 mg | DELAYED_RELEASE_TABLET | Freq: Two times a day (BID) | ORAL | Status: DC

## 2011-12-12 ENCOUNTER — Telehealth: Payer: Self-pay | Admitting: Internal Medicine

## 2011-12-12 NOTE — Telephone Encounter (Signed)
Please return call to Pharmacy for authorization on Protonix RX [facility]- [id number] - [place], [place] [phone]

## 2011-12-12 NOTE — Telephone Encounter (Signed)
Spoke with pharm, prior autho complete.

## 2012-01-10 ENCOUNTER — Telehealth (HOSPITAL_COMMUNITY): Payer: Self-pay | Admitting: *Deleted

## 2012-01-10 MED ORDER — ROSUVASTATIN CALCIUM 40 MG PO TABS: 40.0000 mg | ORAL_TABLET | Freq: Every evening | ORAL | Status: DC

## 2012-01-10 MED ORDER — LEVOTHYROXINE SODIUM 100 MCG PO TABS: 100.0000 ug | ORAL_TABLET | Freq: Every day | ORAL | Status: DC

## 2012-01-10 MED ORDER — ENALAPRIL MALEATE 2.5 MG PO TABS: 2.5000 mg | ORAL_TABLET | Freq: Two times a day (BID) | ORAL | Status: DC

## 2012-01-10 NOTE — Telephone Encounter (Signed)
Bridget Martinez called today for refills. She needs enalapril, synthroid, and crestor called into her pharmacy.  She is completely out.  Thanks.

## 2012-01-10 NOTE — Telephone Encounter (Signed)
[facility].  Pt is currently in between PCPs so I have called in 1 month supply of synthroid until she has seen her new PCP.

## 2012-01-23 ENCOUNTER — Encounter (HOSPITAL_COMMUNITY): Payer: Self-pay

## 2012-01-23 ENCOUNTER — Ambulatory Visit (HOSPITAL_COMMUNITY)
Admission: RE | Admit: 2012-01-23 | Discharge: 2012-01-23 | Disposition: A | Discharge status: Home / Self Care | Payer: BC Managed Care – PPO | Source: Ambulatory Visit | Attending: Internal Medicine | Admitting: Internal Medicine

## 2012-01-23 VITALS — BP 100/60 | HR 79 | Ht 63.0 in | Wt 170.8 lb

## 2012-01-23 DIAGNOSIS — I2510 Atherosclerotic heart disease of native coronary artery without angina pectoris: Secondary | ICD-10-CM

## 2012-01-23 DIAGNOSIS — I5023 Acute on chronic systolic (congestive) heart failure: Secondary | ICD-10-CM

## 2012-01-23 DIAGNOSIS — I5022 Chronic systolic (congestive) heart failure: Secondary | ICD-10-CM

## 2012-01-23 LAB — BASIC METABOLIC PANEL
BUN: 27 mg/dL — ABNORMAL HIGH (ref 6–23)
Calcium: 9.7 mg/dL (ref 8.4–10.5)
GFR calc Af Amer: 56 mL/min — ABNORMAL LOW (ref >90)
GFR calc non Af Amer: 49 mL/min — ABNORMAL LOW (ref >90)
Potassium: 4.5 mEq/L (ref 3.5–5.1)
Sodium: 138 mEq/L (ref 135–145)

## 2012-01-23 MED ORDER — FUROSEMIDE 20 MG PO TABS: 40.0000 mg | ORAL_TABLET | Freq: Every day | ORAL | Status: DC

## 2012-01-23 NOTE — Progress Notes (Signed)
Encounter addended by: Scarlette Calico, RN on: 01/23/2012 10:05 AM<BR>     Documentation filed: Orders

## 2012-01-23 NOTE — Assessment & Plan Note (Addendum)
She returns for follow up and she has been well,  but her weight has started to trend up. 19 pound weight gain over the last 2 months Volume status elevated to jaw. Increase lasix 80 mg bid for the next two days then Lasix 40 mg daily. Reinforced daily weights and low salt diet/ food choices. She is instructed to contact HF clinic if she develops dizziness. Check BMET today. Follow up in 1 week.

## 2012-01-23 NOTE — Assessment & Plan Note (Signed)
No evidence of ischemia

## 2012-01-23 NOTE — Assessment & Plan Note (Addendum)
She returns for follow up and she has been well,  but her weight has started to trend up. 19 pound weight gain over the last 2 months Volume status elevated to jaw. Increase lasix 80 mg bid for the next two days then Lasix 40 mg daily. Reinforced daily weights and low salt diet/ food choices. Check BMET today. Follow up in 1 week.

## 2012-01-23 NOTE — Patient Instructions (Signed)
Take Lasix 80 mg twice a day today and tomorrow the Lasix 40 mg daily  Follow up next week   Do the following things EVERYDAY: 1) Weigh yourself in the morning before breakfast. Write it down and keep it in a log. 2) Take your medicines as prescribed 3) Eat low salt foods--Limit salt (sodium) to 2000 mg per day.  4) Stay as active as you can everyday 5) Limit all fluids for the day to less than 2 liters

## 2012-01-23 NOTE — Progress Notes (Signed)
Patient ID: Bridget Martinez, female   DOB: 1951-07-30, 61 y.o.   MRN: [id number] PCP: Dr Laurance Flatten Endocrinologist: Dr Buddy Duty  HPI: 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 Admitted with NSTEMI and cardiogenic shock in 12/12.Required inotropic support and 50 pound diuresis. CABG x 4 in January 2013 by Dr. Prescott Gum.  Discharged from [facility] 10/11/11 after she was treated for  volume depletion, hypotension and UTI.   Discharged from [facility] 11/15/11 after she was treated for  UTI and Hyperglycemia. Insulin was adjusted.   ECHO 11/21/11 35-40%  She is here for follow up. Denies SOB/PND/Orthopnea. Denies nausea and vomiting. She has been taking Lasix 40 mg daily for the last week.  Weight at home 152-162 pounds and trending up. .  Complaint with all medications. She has not been able to start cardiac rehab. She has had better control of glucose over the last month (glucose <200). Tries to walk 15 minutes per day.     ROS: All systems negative except as listed in HPI, PMH and Problem List.  Past Medical History  Diagnosis Date  . Diabetes mellitus     on insulin, with h/o DKA   . HTN (hypertension)   . Hyperlipidemia   . Venous stasis ulcers   . Angina   . Hypothyroidism   . GERD (gastroesophageal reflux disease)   . Arthritis   . Anxiety   . Depression   . 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 Coronary artery disease   . Heart murmur     Dr Haroldine Laws cardiologist    Current Outpatient Prescriptions  Medication Sig Dispense Refill  . aspirin 325 MG tablet Take 325 mg by mouth daily.        . carvedilol (COREG) 6.25 MG tablet Take 3.125 mg by mouth 2 (two) times daily with a meal.      . clopidogrel (PLAVIX) 75 MG tablet Take 75 mg by mouth daily with breakfast.      . enalapril (VASOTEC) 2.5 MG tablet Take 1 tablet (2.5 mg total) by mouth 2 (two) times daily.  60 tablet  6  .  escitalopram (LEXAPRO) 10 MG tablet Take 10 mg by mouth daily.        . insulin aspart (NOVOLOG) 100 UNIT/ML injection Inject 5-10 Units into the skin 3 (three) times daily with meals. If blood glucose 70-140 mg/dL, then 3 units.  If blood glucose 141-180 mg/dL, then 4 units. If blood glucose 181-220 mg/dL, then 5 units. If blood glucose 221-260 mg/dL, then 6 units. If blood glucose 261-300 mg/dL, then 7 units. If blood glucose 301-340 mg/dL, then 8 units. If blood glucose 341-380 mg/dL, then 9 units. If blood glucose is greater than 381 then 10 units.       . insulin detemir (LEVEMIR) 100 UNIT/ML injection Inject 16 Units into the skin 2 (two) times daily.  10 mL  0  . levothyroxine (SYNTHROID, LEVOTHROID) 100 MCG tablet Take 1 tablet (100 mcg total) by mouth daily.  30 tablet  0  . pantoprazole (PROTONIX) 40 MG tablet Take 1 tablet (40 mg total) by mouth 2 (two) times daily.  60 tablet  6  . rosuvastatin (CRESTOR) 40 MG tablet Take 1 tablet (40 mg total) by mouth every evening.  30 tablet  6  . DISCONTD: furosemide (LASIX) 20 MG tablet Take 1 tablet (20 mg  total) by mouth as needed (only if weight is greater than 163 pounds).  30 tablet  6  . DISCONTD: insulin aspart (NOVOLOG) 100 UNIT/ML injection Inject 3-10 Units into the skin 3 (three) times daily with meals. If blood glucose 70-140 mg/dL, then 3 units.  If blood glucose 141-180 mg/dL, then 4 units. If blood glucose 181-220 mg/dL, then 5 units. If blood glucose 221-260 mg/dL, then 6 units. If blood glucose 261-300 mg/dL, then 7 units. If blood glucose 301-340 mg/dL, then 8 units. If blood glucose 341-380 mg/dL, then 9 units. If blood glucose is greater than 381 then 10 units.  1 pen  0  . DISCONTD: insulin detemir (LEVEMIR) 100 UNIT/ML injection Inject 9 Units into the skin every morning.  10 mL  0     PHYSICAL EXAM: Filed Vitals:   01/23/12 0912  BP: 100/60  Pulse: 79   Weight change:  19 pound weight gain.  170 (151 ) General:  Well  appearing. No resp difficulty HEENT: normal Neck: supple. JVP to jaw Carotids 2+ bilaterally; no bruits. No lymphadenopathy or thryomegaly appreciated. Cor: PMI normal. Regular rate & rhythm. No rubs, gallops or murmurs. Lungs: clear Abdomen: soft, nontender, nondistended. No hepatosplenomegaly. No bruits or masses. Good bowel sounds. Extremities: no cyanosis, clubbing, rash, R and LLE 2+ edema Neuro: alert & orientedx3, cranial nerves grossly intact. Moves all 4 extremities w/o difficulty. Affect pleasant.     ASSESSMENT & PLAN:

## 2012-01-30 ENCOUNTER — Ambulatory Visit (HOSPITAL_COMMUNITY)
Admission: RE | Admit: 2012-01-30 | Discharge: 2012-01-30 | Disposition: A | Discharge status: Home / Self Care | Payer: BC Managed Care – PPO | Source: Ambulatory Visit | Attending: Internal Medicine | Admitting: Internal Medicine

## 2012-01-30 ENCOUNTER — Encounter (HOSPITAL_COMMUNITY): Payer: Self-pay

## 2012-01-30 VITALS — BP 100/60 | HR 84 | Ht 63.0 in | Wt 165.8 lb

## 2012-01-30 DIAGNOSIS — I10 Essential (primary) hypertension: Secondary | ICD-10-CM | POA: Insufficient documentation

## 2012-01-30 DIAGNOSIS — I2510 Atherosclerotic heart disease of native coronary artery without angina pectoris: Secondary | ICD-10-CM

## 2012-01-30 DIAGNOSIS — I5022 Chronic systolic (congestive) heart failure: Secondary | ICD-10-CM

## 2012-01-30 DIAGNOSIS — E119 Type 2 diabetes mellitus without complications: Secondary | ICD-10-CM | POA: Insufficient documentation

## 2012-01-30 MED ORDER — FUROSEMIDE 20 MG PO TABS: 40.0000 mg | ORAL_TABLET | Freq: Every day | ORAL | Status: DC

## 2012-01-30 NOTE — Patient Instructions (Signed)
Continue lasix 40 mg daily.  If weigh goes up 3 pounds or more in 24 hours take and extra lasix 40 mg that day.  Follow up 2 months.

## 2012-01-30 NOTE — Progress Notes (Signed)
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 Admitted with NSTEMI and cardiogenic shock in 12/12.Required inotropic support and 50 pound diuresis. CABG x 4 in January 2013 by Dr. Prescott Gum.  Discharged from [facility] 10/11/11 after she was treated for  volume depletion, hypotension and UTI.   Discharged from [facility] 11/15/11 after she was treated for  UTI and Hyperglycemia. Insulin was adjusted.   ECHO 11/21/11 35-40%  She returns for 1 week follow up.  Weight was trending up therefore lasix doubled for several days.  Did notice more output with doubled.  Weighing 164 pounds.  +mild edema.  No dyspnea.  Chronic 2 pillow orthopnea.  She has not started cardiac rehab.  Still walking 15 minutes per day and chores.     ROS: All systems negative except as listed in HPI, PMH and Problem List.  Past Medical History  Diagnosis Date  . Diabetes mellitus     on insulin, with h/o DKA   . HTN (hypertension)   . Hyperlipidemia   . Venous stasis ulcers   . Angina   . Hypothyroidism   . GERD (gastroesophageal reflux disease)   . Arthritis   . Anxiety   . Depression   . 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 Coronary artery disease   . Heart murmur     Dr Haroldine Laws cardiologist    Current Outpatient Prescriptions  Medication Sig Dispense Refill  . aspirin 325 MG tablet Take 325 mg by mouth daily.        . carvedilol (COREG) 6.25 MG tablet Take 3.125 mg by mouth 2 (two) times daily with a meal.      . clopidogrel (PLAVIX) 75 MG tablet Take 75 mg by mouth daily with breakfast.      . enalapril (VASOTEC) 2.5 MG tablet Take 1 tablet (2.5 mg total) by mouth 2 (two) times daily.  60 tablet  6  . escitalopram (LEXAPRO) 10 MG tablet Take 10 mg by mouth daily.        . furosemide (LASIX) 20 MG tablet Take 2 tablets (40 mg total) by mouth daily.  30 tablet  6    . insulin aspart (NOVOLOG) 100 UNIT/ML injection Inject 5-10 Units into the skin 3 (three) times daily with meals. SLIDING SCALE If blood glucose 70-140 mg/dL, then 3 units.  If blood glucose 141-180 mg/dL, then 4 units. If blood glucose 181-220 mg/dL, then 5 units. If blood glucose 221-260 mg/dL, then 6 units. If blood glucose 261-300 mg/dL, then 7 units. If blood glucose 301-340 mg/dL, then 8 units. If blood glucose 341-380 mg/dL, then 9 units. If blood glucose is greater than 381 then 10 units.      . insulin detemir (LEVEMIR) 100 UNIT/ML injection Inject 17 Units into the skin 2 (two) times daily.      Marland Kitchen KLOR-CON 10 10 MEQ tablet Take 10 mEq by mouth 2 (two) times daily.       Marland Kitchen levothyroxine (SYNTHROID, LEVOTHROID) 100 MCG tablet Take 1 tablet (100 mcg total) by mouth daily.  30 tablet  0  . pantoprazole (PROTONIX) 40 MG tablet Take 1 tablet (40 mg total) by mouth 2 (two) times daily.  60 tablet  6  . rosuvastatin (CRESTOR) 40 MG tablet Take 1 tablet (40 mg total) by mouth every evening.  30 tablet  6  . DISCONTD: insulin aspart (NOVOLOG) 100 UNIT/ML injection Inject 3-10 Units into the skin 3 (three) times daily with meals. If blood glucose 70-140 mg/dL, then 3 units.  If blood glucose 141-180 mg/dL, then 4 units. If blood glucose 181-220 mg/dL, then 5 units. If blood glucose 221-260 mg/dL, then 6 units. If blood glucose 261-300 mg/dL, then 7 units. If blood glucose 301-340 mg/dL, then 8 units. If blood glucose 341-380 mg/dL, then 9 units. If blood glucose is greater than 381 then 10 units.  1 pen  0  . DISCONTD: insulin detemir (LEVEMIR) 100 UNIT/ML injection Inject 9 Units into the skin every morning.  10 mL  0     PHYSICAL EXAM: Filed Vitals:   01/30/12 1013  BP: 100/60  Pulse: 84  Height: 5\' 3"  (1.6 m)  Weight: 165 lb 12.8 oz (75.206 kg)    General:  Well appearing. No resp difficulty HEENT: normal Neck: supple. JVP 6-7 Carotids 2+ bilaterally; no bruits. No lymphadenopathy or  thryomegaly appreciated. Cor: PMI normal. Regular rate & rhythm. No rubs, gallops or murmurs. Lungs: clear Abdomen: soft, nontender, nondistended. No hepatosplenomegaly. No bruits or masses. Good bowel sounds. Extremities: no cyanosis, clubbing, rash, trace edema Neuro: alert & orientedx3, cranial nerves grossly intact. Moves all 4 extremities w/o difficulty. Affect pleasant.     ASSESSMENT & PLAN:

## 2012-01-30 NOTE — Assessment & Plan Note (Addendum)
Volume status looks good today.  NYHA II.  Will continue lasix 40 mg daily.  Have discussed need for sliding scale lasix for weight increase or edema.  Encouraged her to continue low sodium diet.  She voiced understanding.    Patient seen and examined with Leone Haven, PA-C. We discussed all aspects of the encounter. I agree with the assessment and plan as stated above. Bridget Martinez is doing great. Volume status looks good. Reinforced need for daily weights and reviewed use of sliding scale diuretics. Continue other HF meds at current level.

## 2012-02-09 NOTE — Assessment & Plan Note (Signed)
Doing well. No evidence of ischemia. Suggested she f/u with cardiac rehab.

## 2012-02-18 ENCOUNTER — Telehealth (HOSPITAL_COMMUNITY): Payer: Self-pay | Admitting: *Deleted

## 2012-02-18 MED ORDER — POTASSIUM CHLORIDE ER 10 MEQ PO TBCR: 10.0000 meq | EXTENDED_RELEASE_TABLET | Freq: Two times a day (BID) | ORAL | Status: DC

## 2012-02-18 MED ORDER — ENALAPRIL MALEATE 2.5 MG PO TABS: 2.5000 mg | ORAL_TABLET | Freq: Two times a day (BID) | ORAL | Status: DC

## 2012-02-18 NOTE — Telephone Encounter (Signed)
Ms Wade called today for some refills she needs for her enalapril and potassium.  [facility] [place]. Please call her back. Thanks.

## 2012-02-18 NOTE — Telephone Encounter (Signed)
Pt aware rx was sent in 

## 2012-03-06 ENCOUNTER — Telehealth (HOSPITAL_COMMUNITY): Payer: Self-pay | Admitting: *Deleted

## 2012-03-06 MED ORDER — ESCITALOPRAM OXALATE 10 MG PO TABS: 10.0000 mg | ORAL_TABLET | Freq: Every day | ORAL | Status: DC

## 2012-03-06 MED ORDER — LEVOTHYROXINE SODIUM 100 MCG PO TABS: 100.0000 ug | ORAL_TABLET | Freq: Every day | ORAL | Status: DC

## 2012-03-06 NOTE — Telephone Encounter (Signed)
Ms Carini called, she needs refills on her levothyroxine and escitalopram to be called[facility] [place]. Thanks.

## 2012-03-06 NOTE — Telephone Encounter (Signed)
Refills called in as requested. Bridget Martinez appreciated the returned phone call.

## 2012-03-24 ENCOUNTER — Other Ambulatory Visit: Payer: Self-pay | Admitting: Physician Assistant

## 2012-04-21 ENCOUNTER — Telehealth (HOSPITAL_COMMUNITY): Payer: Self-pay | Admitting: Cardiology

## 2012-04-21 DIAGNOSIS — I2510 Atherosclerotic heart disease of native coronary artery without angina pectoris: Secondary | ICD-10-CM

## 2012-04-21 MED ORDER — CLOPIDOGREL BISULFATE 75 MG PO TABS: 75.0000 mg | ORAL_TABLET | Freq: Every day | ORAL | Status: DC

## 2012-04-21 NOTE — Telephone Encounter (Signed)
Pt called to request a refill on Plavix. Refill returned to [facility] as requested. PT aware

## 2012-05-07 ENCOUNTER — Other Ambulatory Visit (HOSPITAL_COMMUNITY): Payer: Self-pay | Admitting: Adult Health

## 2012-05-22 ENCOUNTER — Other Ambulatory Visit (HOSPITAL_COMMUNITY): Payer: Self-pay | Admitting: Adult Health

## 2012-05-23 ENCOUNTER — Emergency Department (HOSPITAL_COMMUNITY)
Admission: EM | Admit: 2012-05-23 | Discharge: 2012-05-23 | Disposition: A | Discharge status: Home / Self Care | Payer: BC Managed Care – PPO | Attending: Emergency Medicine | Admitting: Emergency Medicine

## 2012-05-23 ENCOUNTER — Other Ambulatory Visit: Payer: Self-pay

## 2012-05-23 ENCOUNTER — Encounter (HOSPITAL_COMMUNITY): Payer: Self-pay | Admitting: Family Medicine

## 2012-05-23 ENCOUNTER — Emergency Department (HOSPITAL_COMMUNITY): Payer: BC Managed Care – PPO

## 2012-05-23 DIAGNOSIS — R51 Headache: Secondary | ICD-10-CM | POA: Insufficient documentation

## 2012-05-23 DIAGNOSIS — R1011 Right upper quadrant pain: Secondary | ICD-10-CM | POA: Insufficient documentation

## 2012-05-23 DIAGNOSIS — R079 Chest pain, unspecified: Secondary | ICD-10-CM

## 2012-05-23 DIAGNOSIS — I5022 Chronic systolic (congestive) heart failure: Secondary | ICD-10-CM | POA: Insufficient documentation

## 2012-05-23 DIAGNOSIS — I252 Old myocardial infarction: Secondary | ICD-10-CM | POA: Insufficient documentation

## 2012-05-23 DIAGNOSIS — I25810 Atherosclerosis of coronary artery bypass graft(s) without angina pectoris: Secondary | ICD-10-CM | POA: Insufficient documentation

## 2012-05-23 DIAGNOSIS — I2510 Atherosclerotic heart disease of native coronary artery without angina pectoris: Secondary | ICD-10-CM

## 2012-05-23 DIAGNOSIS — E119 Type 2 diabetes mellitus without complications: Secondary | ICD-10-CM | POA: Insufficient documentation

## 2012-05-23 DIAGNOSIS — R0789 Other chest pain: Secondary | ICD-10-CM

## 2012-05-23 DIAGNOSIS — R0602 Shortness of breath: Secondary | ICD-10-CM | POA: Insufficient documentation

## 2012-05-23 DIAGNOSIS — I10 Essential (primary) hypertension: Secondary | ICD-10-CM | POA: Insufficient documentation

## 2012-05-23 LAB — CBC WITH DIFFERENTIAL/PLATELET
Eosinophils Relative: 3 % (ref 0–5)
HCT: 37 % (ref 36.0–46.0)
Hemoglobin: 12.7 g/dL (ref 12.0–15.0)
Lymphocytes Relative: 14 % (ref 12–46)
Lymphs Abs: 1.3 10*3/uL (ref 0.7–4.0)
MCV: 84.3 fL (ref 78.0–100.0)
Monocytes Absolute: 0.6 10*3/uL (ref 0.1–1.0)
Neutro Abs: 7 10*3/uL (ref 1.7–7.7)
RBC: 4.39 MIL/uL (ref 3.87–5.11)
WBC: 9.2 10*3/uL (ref 4.0–10.5)

## 2012-05-23 LAB — PROTIME-INR
INR: 1.06 (ref 0.00–1.49)
Prothrombin Time: 14 seconds (ref 11.6–15.2)

## 2012-05-23 LAB — COMPREHENSIVE METABOLIC PANEL
ALT: 17 U/L (ref 0–35)
CO2: 31 mEq/L (ref 19–32)
Calcium: 10.5 mg/dL (ref 8.4–10.5)
Chloride: 94 mEq/L — ABNORMAL LOW (ref 96–112)
Creatinine, Ser: 1.55 mg/dL — ABNORMAL HIGH (ref 0.50–1.10)
GFR calc Af Amer: 40 mL/min — ABNORMAL LOW (ref >90)
GFR calc non Af Amer: 35 mL/min — ABNORMAL LOW (ref >90)
Glucose, Bld: 289 mg/dL — ABNORMAL HIGH (ref 70–99)
Total Bilirubin: 0.4 mg/dL (ref 0.3–1.2)

## 2012-05-23 LAB — LIPASE, BLOOD: Lipase: 27 U/L (ref 11–59)

## 2012-05-23 LAB — PRO B NATRIURETIC PEPTIDE: Pro B Natriuretic peptide (BNP): 1625 pg/mL — ABNORMAL HIGH (ref 0–125)

## 2012-05-23 MED ORDER — HYDROCODONE-ACETAMINOPHEN 5-325 MG PO TABS: 1.0000 | ORAL_TABLET | Freq: Four times a day (QID) | ORAL | Status: AC | PRN

## 2012-05-23 MED ORDER — NITROGLYCERIN 0.4 MG SL SUBL
0.4000 mg | SUBLINGUAL_TABLET | SUBLINGUAL | Status: DC | PRN
  Administered 2012-05-23: 0.4 mg via SUBLINGUAL
  Filled 2012-05-23: qty 25

## 2012-05-23 MED ORDER — ASPIRIN 81 MG PO CHEW
324.0000 mg | CHEWABLE_TABLET | Freq: Once | ORAL | Status: AC
  Administered 2012-05-23: 324 mg via ORAL
  Filled 2012-05-23: qty 4

## 2012-05-23 MED ORDER — SODIUM CHLORIDE 0.9 % IV BOLUS (SEPSIS)
500.0000 mL | Freq: Once | INTRAVENOUS | Status: AC
  Administered 2012-05-23: 1000 mL via INTRAVENOUS

## 2012-05-23 NOTE — ED Provider Notes (Signed)
Patient care assumed from T. K, PA-C. Patient reassessed, RUQ abdominal improved. Imaging does not show cholelithiasis or sludge. No red flags for acute cholecystitis or acute abdominal process. Patient discharged on pain medication for symptomatic relief and follow-up with primary care. Return precautions given verbally and in discharge instructions.  US Abdomen Complete (Final result)   Result time:05/23/12 1723    Final result by Rad Results In Interface (05/23/12 17:23:49)    Narrative:   *RADIOLOGY REPORT*  Clinical Data: Right upper quadrant abdominal pain. Chest pain. History of hypertension and diabetes.  COMPLETE ABDOMINAL ULTRASOUND  Comparison: Abdominal ultrasound 08/01/2011, 07/25/2011.  Findings:  Gallbladder: No shadowing gallstones or echogenic sludge. No gallbladder wall thickening or pericholecystic fluid. Negative sonographic Murphy's sign according to the ultrasound technologist.  Common bile duct: Normal in caliber with maximum diameter approximating 4 mm.  Liver: Normal size and echotexture without focal parenchymal abnormality. Patent portal vein with hepatopetal flow.  IVC: Patent.  Pancreas: Although the pancreas is difficult to visualize in its entirety, no focal pancreatic abnormality is identified.  Spleen: Normal size and echotexture without focal parenchymal abnormality.  Right Kidney: No hydronephrosis. Well-preserved cortex. No shadowing calculi. Normal size and parenchymal echotexture. Approximate 1.1 x 1.0 x 1.2 cm cyst arising from the mid pole. No significant focal parenchymal abnormality. Approximately 10.7 cm in length.  Left Kidney: No hydronephrosis. Well-preserved cortex. No shadowing calculi. Normal size and parenchymal echotexture without focal abnormalities. Approximately 10.7 cm in length.  Abdominal aorta: Normal in caliber in its proximal and midportion; obscured distally by overlying bowel gas.  IMPRESSION: No  significant abnormalities with a caveat that the distal abdominal aorta was obscured by overlying bowel gas and was therefore not evaluated.   Original Report Authenticated By: Deniece Portela, M.D.      Annalee Genta, PA-C 05/23/12 1758

## 2012-05-23 NOTE — ED Provider Notes (Signed)
History     CSN: [id number]  Arrival date & time 05/23/12  1138   First MD Initiated Contact with Patient 05/23/12 1219      Chief Complaint  Patient presents with  . Chest Pain    (Consider location/radiation/quality/duration/timing/severity/associated sxs/prior treatment) Patient is a 61 y.o. female presenting with chest pain. The history is provided by the patient.  Chest Pain The chest pain began 1 - 2 hours ago. Chest pain occurs constantly. The chest pain is improving. The severity of the pain is moderate. The quality of the pain is described as pressure-like. The pain radiates to the right shoulder and right arm. Primary symptoms include shortness of breath. Pertinent negatives for primary symptoms include no fever, no cough, no palpitations, no abdominal pain, no nausea, no vomiting and no dizziness.  Pertinent negatives for associated symptoms include no numbness and no weakness.   Pt states her pain is substernal radiating into right chest. States came on while doing "stuff around my house." States hx of MI in the past, had CABG last year. Denies dizziness, diaphoresis, URI symptoms  Past Medical History  Diagnosis Date  . Diabetes mellitus     on insulin, with h/o DKA   . HTN (hypertension)   . Hyperlipidemia   . Venous stasis ulcers   . Angina   . Hypothyroidism   . GERD (gastroesophageal reflux disease)   . Arthritis   . Anxiety   . Depression   . 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 Coronary artery disease   . Heart murmur     Dr Haroldine Laws cardiologist    Past Surgical History  Procedure Date  . Cardiac catheterization   . Tonsillectomy   . Coronary artery bypass graft     Dr. Prescott Gum in 09/2011     Family History  Problem Relation Age of Onset  . Heart disease Father   . Hypertension Mother   . Multiple sclerosis Father   . Hyperthyroidism Mother   . Diabetes Cousin     Multiple maternal cousins  with type 2 diabetes mellitus  . Diabetes Maternal Uncle     Type 1 diabetes mellitus    History  Substance Use Topics  . Smoking status: Never Smoker   . Smokeless tobacco: Not on file  . Alcohol Use: No    OB History    Grav Para Term Preterm Abortions TAB SAB Ect Mult Living                  Review of Systems  Constitutional: Negative for fever and chills.  HENT: Negative for neck pain and neck stiffness.   Respiratory: Positive for chest tightness and shortness of breath. Negative for cough.   Cardiovascular: Positive for chest pain. Negative for palpitations.  Gastrointestinal: Negative for nausea, vomiting and abdominal pain.  Genitourinary: Negative for dysuria, hematuria and flank pain.  Musculoskeletal: Negative for myalgias and arthralgias.  Skin: Negative.   Neurological: Negative for dizziness, weakness, light-headedness, numbness and headaches.  Hematological: Negative.   Psychiatric/Behavioral: Negative.     Allergies  Sulfa antibiotics  Home Medications   Current Outpatient Rx  Name Route Sig Dispense Refill  . ASPIRIN EC 81 MG PO TBEC Oral Take 81 mg by mouth daily.    . ATORVASTATIN CALCIUM 40 MG PO TABS Oral Take 40 mg by mouth daily.    Marland Kitchen CARVEDILOL 3.125 MG PO TABS Oral Take 3.125 mg by  mouth 2 (two) times daily with a meal.    . CLOPIDOGREL BISULFATE 75 MG PO TABS Oral Take 1 tablet (75 mg total) by mouth daily with breakfast. 30 tablet 11  . ENALAPRIL MALEATE 2.5 MG PO TABS Oral Take 1 tablet (2.5 mg total) by mouth 2 (two) times daily. 60 tablet 6  . ESCITALOPRAM OXALATE 10 MG PO TABS Oral Take 1 tablet (10 mg total) by mouth daily. 30 tablet 1  . FUROSEMIDE 20 MG PO TABS Oral Take 2 tablets (40 mg total) by mouth daily. 60 tablet 6  . INSULIN DETEMIR 100 UNIT/ML [place] SOLN Subcutaneous Inject 22 Units into the skin 2 (two) times daily.     . INSULIN LISPRO (HUMAN) 100 UNIT/ML [place] SOLN Subcutaneous Inject 5-8 Units into the skin 3 (three) times daily  before meals. Sliding scale    . LEVOTHYROXINE SODIUM 100 MCG PO TABS Oral Take 1 tablet (100 mcg total) by mouth daily. 30 tablet 1  . PANTOPRAZOLE SODIUM 40 MG PO TBEC Oral Take 1 tablet (40 mg total) by mouth 2 (two) times daily. 60 tablet 6  . POTASSIUM CHLORIDE ER 10 MEQ PO TBCR Oral Take 1 tablet (10 mEq total) by mouth 2 (two) times daily. 60 tablet 6    BP 113/50  Pulse 63  Temp 98.1 F (36.7 C) (Oral)  Resp 9  SpO2 98%  Physical Exam  Nursing note and vitals reviewed. Constitutional: She is oriented to person, place, and time. She appears well-developed and well-nourished. No distress.  HENT:  Head: Normocephalic.  Eyes: Conjunctivae normal are normal.  Neck: Neck supple.  Cardiovascular: Normal rate, regular rhythm and normal heart sounds.   Pulmonary/Chest: Effort normal and breath sounds normal. No respiratory distress. She has no wheezes. She has no rales.  Abdominal: Soft. Bowel sounds are normal. She exhibits no distension. There is no tenderness. There is no rebound.  Musculoskeletal: She exhibits no edema.  Neurological: She is alert and oriented to person, place, and time.  Skin: Skin is warm and dry.  Psychiatric: She has a normal mood and affect.    ED Course  Procedures (including critical care time)  PT with CP, hx of CHF and CAD, CABG 9 mon ago. Will get labs, CXR, ECG, ASA and NG ordered.   Results for orders placed during the hospital encounter of 05/23/12  CBC WITH DIFFERENTIAL      Component Value Range   WBC 9.2  4.0 - 10.5 K/uL   RBC 4.39  3.87 - 5.11 MIL/uL   Hemoglobin 12.7  12.0 - 15.0 g/dL   HCT 37.0  36.0 - 46.0 %   MCV 84.3  78.0 - 100.0 fL   MCH 28.9  26.0 - 34.0 pg   MCHC 34.3  30.0 - 36.0 g/dL   RDW 13.5  11.5 - 15.5 %   Platelets 253  150 - 400 K/uL   Neutrophils Relative 76  43 - 77 %   Neutro Abs 7.0  1.7 - 7.7 K/uL   Lymphocytes Relative 14  12 - 46 %   Lymphs Abs 1.3  0.7 - 4.0 K/uL   Monocytes Relative 7  3 - 12 %    Monocytes Absolute 0.6  0.1 - 1.0 K/uL   Eosinophils Relative 3  0 - 5 %   Eosinophils Absolute 0.3  0.0 - 0.7 K/uL   Basophils Relative 0  0 - 1 %   Basophils Absolute 0.0  0.0 - 0.1 K/uL  COMPREHENSIVE METABOLIC PANEL      Component Value Range   Sodium 136  135 - 145 mEq/L   Potassium 3.8  3.5 - 5.1 mEq/L   Chloride 94 (*) 96 - 112 mEq/L   CO2 31  19 - 32 mEq/L   Glucose, Bld 289 (*) 70 - 99 mg/dL   BUN 35 (*) 6 - 23 mg/dL   Creatinine, Ser 1.55 (*) 0.50 - 1.10 mg/dL   Calcium 10.5  8.4 - 10.5 mg/dL   Total Protein 8.2  6.0 - 8.3 g/dL   Albumin 4.3  3.5 - 5.2 g/dL   AST 21  0 - 37 U/L   ALT 17  0 - 35 U/L   Alkaline Phosphatase 126 (*) 39 - 117 U/L   Total Bilirubin 0.4  0.3 - 1.2 mg/dL   GFR calc non Af Amer 35 (*) >90 mL/min   GFR calc Af Amer 40 (*) >90 mL/min  APTT      Component Value Range   aPTT 27  24 - 37 seconds  PROTIME-INR      Component Value Range   Prothrombin Time 14.0  11.6 - 15.2 seconds   INR 1.06  0.00 - 1.49  POCT I-STAT TROPONIN I      Component Value Range   Troponin i, poc 0.01  0.00 - 0.08 ng/mL   Comment 3           PRO B NATRIURETIC PEPTIDE      Component Value Range   Pro B Natriuretic peptide (BNP) 1625.0 (*) 0 - 125 pg/mL   Dg Chest 2 View  05/23/2012  *RADIOLOGY REPORT*  Clinical Data: 61 year old female with chest pain shortness of breath weakness, headache.  CHEST - 2 VIEW  Comparison: 11/14/2011 and earlier.  Findings: Stable sequelae of CABG. Stable cardiomegaly and mediastinal contours.  Stable lung volumes.  No pneumothorax, pulmonary edema, pleural effusion or acute pulmonary opacity. Calcified atherosclerosis of the abdominal aorta. No acute osseous abnormality identified.  IMPRESSION: No acute cardiopulmonary abnormality.   Original Report Authenticated By: Randall An, M.D.    2:06 PM Unchanged ECG. Labs unremarkable other than worsening renal function and elevated glu at 289. Pt's CP resolved with NG. Will call cardiology for  consult.   Spoke with Dr. Etter Sjogren, will come see pt.   3:54 PM Pt was seen by Dr. Haroldine Laws, does not believe this is cardiac. Recommended [place] to look at gallbladder. [place] ordered. If negative pt OK to go home.   Pt signed out wit PA oliveri at shift change. [place] pending. Pt is pain free   No diagnosis found.    [place], PA 05/24/12 1012

## 2012-05-23 NOTE — Consult Note (Signed)
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 Admitted with NSTEMI and cardiogenic shock in 12/12. Required inotropic support and 50 pound diuresis. CABG x 4 in January 2013 by Dr. Prescott Gum.    ECHO 11/21/11 35-40%   She has been doing very well recently. Active in yard without CP/SOB. This morning ate a large breakfast and then developed R sided CP radiating to R shoulder and arm. Pain lasted for awhile so came to ED. Pain resolved spontaneously now says she hurts under her R ribs. Denies pain getting worse with exertion. No n/v, diaphoresis, HF.  Unlike previous angina which manifested as nausea and dyspnea.   Says she has a h/o GB problems in past but never had GB out.   ECG and CE normal.   Review of Systems:     Cardiac Review of Systems: {Y] = yes [ ]  = no  Chest Pain [  y  ]  Resting SOB [   ] Exertional SOB  [  ]  Orthopnea [  ]   Pedal Edema [   ]    Palpitations [  ] Syncope  [  ]   Presyncope [   ]  General Review of Systems: [Y] = yes [  ]=no Constitional: recent weight change [  ]; anorexia [  ]; fatigue [  ]; nausea [  ]; night sweats [  ]; fever [  ]; or chills [  ];                                                                                                                                          Dental: poor dentition[  ];  Eye : blurred vision [  ]; diplopia [   ]; vision changes [  ];  Amaurosis fugax[  ]; Resp: cough [  ];  wheezing[  ];  hemoptysis[  ]; shortness of breath[  ]; paroxysmal nocturnal dyspnea[  ]; dyspnea on exertion[  ]; or orthopnea[  ];  GI:  gallstones[  ], vomiting[  ];  dysphagia[  ]; melena[  ];  hematochezia [  ]; heartburn[  ];    GU: kidney stones [  ]; hematuria[  ];   dysuria [  ];  nocturia[  ];  history of     obstruction [  ];                 Skin: rash, swelling[  ];, hair loss[  ];  peripheral edema[  ];   or itching[  ]; Musculosketetal: myalgias[  ];  joint swelling[  ];  joint erythema[  ];  joint pain[ y ];  back pain[  ];  Heme/Lymph: bruising[  ];  bleeding[  ];  anemia[  ];  Neuro:  TIA[  ];  headaches[  ];  stroke[  ];  vertigo[  ];  seizures[  ];   paresthesias[  ];  difficulty walking[  ];  Psych:depression[ y ]; [place]  ];  Endocrine: diabetes[ y ];  thyroid dysfunction[  ];  Immunizations: Flu [  ]; Pneumococcal[  ];  Other:  Past Medical History  Diagnosis Date  . Diabetes mellitus     on insulin, with h/o DKA   . HTN (hypertension)   . Hyperlipidemia   . Venous stasis ulcers   . Angina   . Hypothyroidism   . GERD (gastroesophageal reflux disease)   . Arthritis   . Anxiety   . Depression   . 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 Coronary artery disease   . Heart murmur     Dr Haroldine Laws cardiologist     (Not in a hospital admission)      . aspirin  324 mg Oral Once  . sodium chloride  500 mL Intravenous Once    Infusions:    Allergies  Allergen Reactions  . Sulfa Antibiotics Anaphylaxis and Swelling    History   Social History  . Marital Status: Married    Spouse Name: Grayland Ormond [phone]; Son Rush Landmark    Number of Children: 2  . Years of Education: N/A   Occupational History  . Not on file.   Social History Main Topics  . Smoking status: Never Smoker   . Smokeless tobacco: Not on file  . Alcohol Use: No  . Drug Use: No  . Sexually Active: Yes    Birth Control/ Protection: Post-menopausal   Other Topics Concern  . Not on file   Social History Narrative   Bridget Martinez (Husband): [phone] [employer]) [phone] (Cell)    Family History  Problem Relation Age of Onset  . Heart disease Father   . Hypertension Mother   . Multiple sclerosis Father   . Hyperthyroidism Mother   . Diabetes Cousin     Multiple maternal cousins with type 2 diabetes mellitus  . Diabetes Maternal Uncle     Type 1  diabetes mellitus    PHYSICAL EXAM: Filed Vitals:   05/23/12 1500  BP: 120/94  Pulse: 70  Temp:   Resp: 18    No intake or output data in the 24 hours ending 05/23/12 1514  General: Well appearing. No resp difficulty  HEENT: normal  Neck: supple. JVP 6 Carotids 2+ bilaterally; no bruits. No lymphadenopathy or thryomegaly appreciated.  Cor: PMI normal. Regular rate & rhythm. No rubs, gallops or murmurs.  Lungs: clear  Abdomen: obese soft, tender in , nondistended. No hepatosplenomegaly. No bruits or masses. Good bowel sounds.  Extremities: no cyanosis, clubbing, rash, trace edema with healing sore on R Neuro: alert & orientedx3, cranial nerves grossly intact. Moves all 4 extremities w/o difficulty. Affect pleasant.   ECG:  Results for orders placed during the hospital encounter of 05/23/12 (from the past 24 hour(s))  CBC WITH DIFFERENTIAL     Status: Normal   Collection Time   05/23/12 11:45 AM      Component Value Range   WBC 9.2  4.0 - 10.5 K/uL   RBC 4.39  3.87 - 5.11 MIL/uL   Hemoglobin 12.7  12.0 - 15.0 g/dL   HCT 37.0  36.0 - 46.0 %   MCV 84.3  78.0 - 100.0 fL   MCH 28.9  26.0 - 34.0 pg  MCHC 34.3  30.0 - 36.0 g/dL   RDW 13.5  11.5 - 15.5 %   Platelets 253  150 - 400 K/uL   Neutrophils Relative 76  43 - 77 %   Neutro Abs 7.0  1.7 - 7.7 K/uL   Lymphocytes Relative 14  12 - 46 %   Lymphs Abs 1.3  0.7 - 4.0 K/uL   Monocytes Relative 7  3 - 12 %   Monocytes Absolute 0.6  0.1 - 1.0 K/uL   Eosinophils Relative 3  0 - 5 %   Eosinophils Absolute 0.3  0.0 - 0.7 K/uL   Basophils Relative 0  0 - 1 %   Basophils Absolute 0.0  0.0 - 0.1 K/uL  COMPREHENSIVE METABOLIC PANEL     Status: Abnormal   Collection Time   05/23/12 11:45 AM      Component Value Range   Sodium 136  135 - 145 mEq/L   Potassium 3.8  3.5 - 5.1 mEq/L   Chloride 94 (*) 96 - 112 mEq/L   CO2 31  19 - 32 mEq/L   Glucose, Bld 289 (*) 70 - 99 mg/dL   BUN 35 (*) 6 - 23 mg/dL   Creatinine, Ser 1.55 (*)  0.50 - 1.10 mg/dL   Calcium 10.5  8.4 - 10.5 mg/dL   Total Protein 8.2  6.0 - 8.3 g/dL   Albumin 4.3  3.5 - 5.2 g/dL   AST 21  0 - 37 U/L   ALT 17  0 - 35 U/L   Alkaline Phosphatase 126 (*) 39 - 117 U/L   Total Bilirubin 0.4  0.3 - 1.2 mg/dL   GFR calc non Af Amer 35 (*) >90 mL/min   GFR calc Af Amer 40 (*) >90 mL/min  APTT     Status: Normal   Collection Time   05/23/12 11:45 AM      Component Value Range   aPTT 27  24 - 37 seconds  PROTIME-INR     Status: Normal   Collection Time   05/23/12 11:45 AM      Component Value Range   Prothrombin Time 14.0  11.6 - 15.2 seconds   INR 1.06  0.00 - 1.49  POCT I-STAT TROPONIN I     Status: Normal   Collection Time   05/23/12 12:18 PM      Component Value Range   Troponin i, poc 0.01  0.00 - 0.08 ng/mL   Comment 3           PRO B NATRIURETIC PEPTIDE     Status: Abnormal   Collection Time   05/23/12 12:54 PM      Component Value Range   Pro B Natriuretic peptide (BNP) 1625.0 (*) 0 - 125 pg/mL   Dg Chest 2 View  05/23/2012  *RADIOLOGY REPORT*  Clinical Data: 61 year old female with chest pain shortness of breath weakness, headache.  CHEST - 2 VIEW  Comparison: 11/14/2011 and earlier.  Findings: Stable sequelae of CABG. Stable cardiomegaly and mediastinal contours.  Stable lung volumes.  No pneumothorax, pulmonary edema, pleural effusion or acute pulmonary opacity. Calcified atherosclerosis of the abdominal aorta. No acute osseous abnormality identified.  IMPRESSION: No acute cardiopulmonary abnormality.   Original Report Authenticated By: Randall An, M.D.      ASSESSMENT: 1) Chest pain, non-cardiac 2) Abdominal pain 3) CAD s/p CABG 4) Chronic systolic HF - well-compensated 5) DM2 - poorly controlled  PLAN/DISCUSSION:  I suspect her CP is non-cardiac as  her symptoms are quite different from her previous MI and her ECG and CE are normal. Based on her exam I am more concerned that this might be related to her gallbladder although her  LFTs and WBC are normal. I have d/w Dr. Monia Pouch and we will plan ab u/s to further evaluate. She is OK for d/c from a cardiac perspective. Volume status well controlled.  Benay Spice 3:31 PM

## 2012-05-23 NOTE — Progress Notes (Signed)
Date: 05/23/2012  Rate:67  Rhythm: normal sinus rhythm  QRS Axis: left  Intervals: normal QRS:  Q waves in inferior leads suggest old inferior myocardial infarction; Poor R wave progression in precordial leads suggests old anterior myocardial infarction.  ST/T Wave abnormalities: normal  Conduction Disutrbances:none  Narrative Interpretation: Abnormal EKG.  Old EKG Reviewed: unchanged

## 2012-05-23 NOTE — ED Notes (Signed)
Pt complaining of chest pain that started this am. sts associated with SOB. sts has eased off some. Hx of bypass in January

## 2012-05-23 NOTE — ED Notes (Signed)
MD at bedside. 

## 2012-05-23 NOTE — ED Notes (Signed)
Pt to US via stretcher

## 2012-05-23 NOTE — Progress Notes (Signed)
61 yo woman with prior MI, CABG in January 2013, had chest pain this morning.  Exam shows heart sounds normal, lungs clear.  EKG shows old inferior as well as old anterior MI's.  Chest x-ray negative,  TNI WNL.  Recommend call to [facility], to see and probably admit her.

## 2012-05-23 NOTE — ED Notes (Signed)
Pt to xray via wc

## 2012-05-23 NOTE — ED Notes (Signed)
Pt awaiting [place] results.  Rates pain at 1/10. Husband at bedside

## 2012-05-24 NOTE — ED Provider Notes (Signed)
Medical screening examination/treatment/procedure(s) were performed by non-physician practitioner and as supervising physician I was immediately available for consultation/collaboration.  Perlie Mayo, MD 05/24/12 [phone]

## 2012-05-26 ENCOUNTER — Other Ambulatory Visit (HOSPITAL_COMMUNITY): Payer: Self-pay | Admitting: Physician Assistant

## 2012-05-26 ENCOUNTER — Other Ambulatory Visit (HOSPITAL_COMMUNITY): Payer: Self-pay | Admitting: Adult Health

## 2012-05-26 NOTE — ED Provider Notes (Signed)
Medical screening examination/treatment/procedure(s) were conducted as a shared visit with non-physician practitioner(s) and myself.  I personally evaluated the patient during the encounter 61 yo woman with chest pain starting about 2 hours ago, felt in the left chest, radiating to left arm and shoulder.  She had CABG in January. Exam shows her in no distress, heart and lungs clear, EKG benign, TNI negative.  Where she has a known history of CAD, recommended cardiology consultation.  Mylinda Latina III, MD 05/26/12 [phone]

## 2012-06-22 ENCOUNTER — Other Ambulatory Visit (HOSPITAL_COMMUNITY): Payer: Self-pay | Admitting: *Deleted

## 2012-06-22 MED ORDER — CARVEDILOL 6.25 MG PO TABS: 6.2500 mg | ORAL_TABLET | Freq: Two times a day (BID) | ORAL | Status: DC

## 2012-07-14 ENCOUNTER — Ambulatory Visit (HOSPITAL_COMMUNITY)
Admission: RE | Admit: 2012-07-14 | Discharge: 2012-07-14 | Disposition: A | Discharge status: Home / Self Care | Payer: BC Managed Care – PPO | Source: Ambulatory Visit | Attending: Internal Medicine | Admitting: Internal Medicine

## 2012-07-14 VITALS — BP 138/60 | HR 86 | Wt 182.5 lb

## 2012-07-14 DIAGNOSIS — I5022 Chronic systolic (congestive) heart failure: Secondary | ICD-10-CM

## 2012-07-14 DIAGNOSIS — I10 Essential (primary) hypertension: Secondary | ICD-10-CM

## 2012-07-14 DIAGNOSIS — I2510 Atherosclerotic heart disease of native coronary artery without angina pectoris: Secondary | ICD-10-CM

## 2012-07-14 LAB — BASIC METABOLIC PANEL
CO2: 23 mEq/L (ref 19–32)
Chloride: 93 mEq/L — ABNORMAL LOW (ref 96–112)
Creatinine, Ser: 1.04 mg/dL (ref 0.50–1.10)
GFR calc Af Amer: 65 mL/min — ABNORMAL LOW (ref >90)
Potassium: 4.4 mEq/L (ref 3.5–5.1)

## 2012-07-14 MED ORDER — CLOPIDOGREL BISULFATE 75 MG PO TABS: 75.0000 mg | ORAL_TABLET | Freq: Every day | ORAL | Status: AC

## 2012-07-14 MED ORDER — ROSUVASTATIN CALCIUM 40 MG PO TABS: 40.0000 mg | ORAL_TABLET | Freq: Every day | ORAL | Status: DC

## 2012-07-14 MED ORDER — CARVEDILOL 6.25 MG PO TABS: 9.3750 mg | ORAL_TABLET | Freq: Two times a day (BID) | ORAL | Status: DC

## 2012-07-14 MED ORDER — POTASSIUM CHLORIDE ER 10 MEQ PO TBCR: 10.0000 meq | EXTENDED_RELEASE_TABLET | Freq: Every day | ORAL | Status: DC

## 2012-07-14 NOTE — Assessment & Plan Note (Signed)
No ischemic symptoms, continue current therapy.

## 2012-07-14 NOTE — Progress Notes (Signed)
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 Admitted with NSTEMI and cardiogenic shock in 12/12.Required inotropic support and 50 pound diuresis. CABG x 4 in January 2013 by Dr. Prescott Gum.  Discharged from [facility] 10/11/11 after she was treated for  volume depletion, hypotension and UTI.   Discharged from [facility] 11/15/11 after she was treated for  UTI and Hyperglycemia. Insulin was adjusted.   ECHO 11/21/11 35-40%  She returns for follow up a day.  She is only taking lasix on days she is swelling, only taking it every couple of weeks.  She is weighing.  Mild dyspnea with exertion.  Chronic 2 pillow orthopnea.  Continues to watch sodium intake.  She was walking everyday but this has slacked off the last couple of weeks due to head congestion.  Taking alka-selzter cold medicine with some relief.  Never made it to cardiac rehab.    ROS: All systems negative except as listed in HPI, PMH and Problem List.  Past Medical History  Diagnosis Date  . Diabetes mellitus     on insulin, with h/o DKA   . HTN (hypertension)   . Hyperlipidemia   . Venous stasis ulcers   . Angina   . Hypothyroidism   . GERD (gastroesophageal reflux disease)   . Arthritis   . Anxiety   . Depression   . 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 Coronary artery disease   . Heart murmur     Dr Haroldine Laws cardiologist    Current Outpatient Prescriptions  Medication Sig Dispense Refill  . aspirin EC 81 MG tablet Take 81 mg by mouth daily.      . carvedilol (COREG) 6.25 MG tablet Take 1 tablet (6.25 mg total) by mouth 2 (two) times daily with a meal.  60 tablet  6  . clopidogrel (PLAVIX) 75 MG tablet Take 1 tablet (75 mg total) by mouth daily with breakfast.  30 tablet  11  . enalapril (VASOTEC) 2.5 MG tablet Take 1 tablet (2.5 mg total) by mouth 2 (two) times daily.  60  tablet  6  . escitalopram (LEXAPRO) 10 MG tablet Take 1 tablet (10 mg total) by mouth daily.  30 tablet  1  . furosemide (LASIX) 20 MG tablet Take 2 tablets (40 mg total) by mouth daily.  60 tablet  6  . insulin detemir (LEVEMIR) 100 UNIT/ML injection Inject 24 Units into the skin 2 (two) times daily.       . insulin lispro (HUMALOG) 100 UNIT/ML injection Inject 5-8 Units into the skin 3 (three) times daily before meals. Sliding scale      . levothyroxine (SYNTHROID, LEVOTHROID) 100 MCG tablet Take 1 tablet (100 mcg total) by mouth daily.  30 tablet  1  . pantoprazole (PROTONIX) 40 MG tablet Take 40 mg by mouth daily.      . potassium chloride (K-DUR) 10 MEQ tablet Take 10 mEq by mouth daily.      . rosuvastatin (CRESTOR) 40 MG tablet Take 40 mg by mouth daily.      . [DISCONTINUED] pantoprazole (PROTONIX) 40 MG tablet Take 1 tablet (40 mg total) by mouth 2 (two) times daily.  60 tablet  6  . [DISCONTINUED] potassium chloride (KLOR-CON 10) 10 MEQ tablet Take 1 tablet (10 mEq total) by mouth 2 (two) times daily.  [place]  tablet  6  . [DISCONTINUED] insulin detemir (LEVEMIR) 100 UNIT/ML injection Inject 9 Units into the skin every morning.  10 mL  0  . [DISCONTINUED] pantoprazole (PROTONIX) 40 MG tablet TAKE ONE TABLET BY MOUTH AT BEDTIME  30 tablet  6     PHYSICAL EXAM: Filed Vitals:   07/14/12 0834  BP: 138/60  Pulse: 86  Weight: 182 lb 8 oz (82.781 kg)  SpO2: 92%    General:  Well appearing. No resp difficulty HEENT: normal Neck: supple. JVP 7-8. Carotids 2+ bilaterally; no bruits. No lymphadenopathy or thryomegaly appreciated. Cor: PMI normal. Regular rate & rhythm. No rubs, gallops or murmurs. Lungs: clear Abdomen: soft, nontender, nondistended. No hepatosplenomegaly. No bruits or masses. Good bowel sounds. Extremities: no cyanosis, clubbing, rash, trace edema Neuro: alert & orientedx3, cranial nerves grossly intact. Moves all 4 extremities w/o difficulty. Affect  pleasant.     ASSESSMENT & PLAN:

## 2012-07-14 NOTE — Assessment & Plan Note (Signed)
Would like SBP<130, as above increase carvedilol 9.375 mg BID.

## 2012-07-14 NOTE — Patient Instructions (Signed)
Take lasix 40 mg and potassium if weight is 180 pounds or greater.    Increase Carvedilol 1.5 tabs (9.375 mg) twice daily.  Follow up 3 weeks with echo.    Do the following things EVERYDAY: 1) Weigh yourself in the morning before breakfast. Write it down and keep it in a log. 2) Take your medicines as prescribed 3) Eat low salt foods-Limit salt (sodium) to 2000 mg per day.  4) Stay as active as you can everyday 5) Limit all fluids for the day to less than 2 liters

## 2012-07-14 NOTE — Assessment & Plan Note (Addendum)
Weight is elevated but fluid status only minimally up.  Her weight at home is 181 pounds today therefore have discussed taking lasix 40 mg and potassium on days weight is 180 pounds or greater.  Will also titrate carvedilol 9.375 mg BID.  Will have her follow up 3 weeks with a repeat echo, am hopeful her heart may continue to improve.  BMET today as Cr minimally elevated last month.

## 2012-07-20 IMAGING — NM NM HEPATO W/GB/PHARM/[PERSON_NAME]
2 series · 12 of 12 positions shown · non-contrast
Comparison: abdominal ultrasound 08/01/2011.

CLINICAL DATA: Abdominal pain and nausea.

NUCLEAR MEDICINE HEPATOBILIARY IMAGING WITH GALLBLADDER EF
TECHNIQUE: Sequential images of the abdomen were obtained [DATE] minutes following intravenous administration of
radiopharmaceutical.  After slow intravenous infusion of
micrograms Cholecystokinin, gallbladder ejection fraction was
determined.
Radiopharmaceutical:  5.0 mCi 4c-QQm Choletec

[he hepatobiliary · 3.43mm/px · 6 of 49 frames shown (1 of 2)]
[frame 5/49]
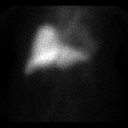
[frame 13/49]
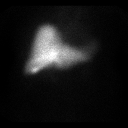
[frame 21/49]
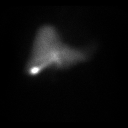
[frame 29/49]
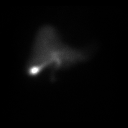
[frame 37/49]
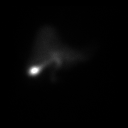
[frame 45/49]
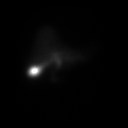

[he hepatobiliary · 3.43mm/px · 6 of 30 frames shown (2 of 2)]
[frame 3/30]
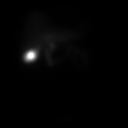
[frame 8/30]
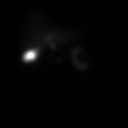
[frame 13/30]
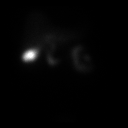
[frame 18/30]
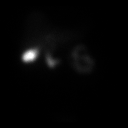
[frame 23/30]
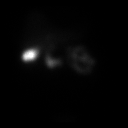
[frame 28/30]
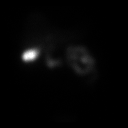

[12 of 12 positions shown; findings below may reference images not displayed]

FINDINGS: There is symmetric uptake in the liver and prompt
excretion into the biliary tree which is visualized by 10 minutes.
The gallbladder is visualized at 98minutes.  Activity is noted in
the small bowel at 50 minutes.

The patient received a protocoled infusion of CCK and a gallbladder
ejection fraction was estimated at 13%.  Normal is greater than
30%.

The patient did not experience symptoms during CCK infusion.
IMPRESSION: 1.  Normal biliary patency study.
2.  Low gallbladder ejection fraction at 13%.

## 2012-07-30 ENCOUNTER — Ambulatory Visit (HOSPITAL_COMMUNITY)
Admission: RE | Admit: 2012-07-30 | Discharge: 2012-07-30 | Disposition: A | Discharge status: Home / Self Care | Payer: BC Managed Care – PPO | Source: Ambulatory Visit | Attending: Nurse Practitioner | Admitting: Nurse Practitioner

## 2012-07-30 ENCOUNTER — Ambulatory Visit (HOSPITAL_BASED_OUTPATIENT_CLINIC_OR_DEPARTMENT_OTHER)
Admission: RE | Admit: 2012-07-30 | Discharge: 2012-07-30 | Disposition: A | Discharge status: Home / Self Care | Payer: BC Managed Care – PPO | Source: Ambulatory Visit | Attending: Internal Medicine | Admitting: Internal Medicine

## 2012-07-30 VITALS — BP 136/60 | HR 82 | Wt 182.8 lb

## 2012-07-30 DIAGNOSIS — F329 Major depressive disorder, single episode, unspecified: Secondary | ICD-10-CM | POA: Insufficient documentation

## 2012-07-30 DIAGNOSIS — I509 Heart failure, unspecified: Secondary | ICD-10-CM | POA: Insufficient documentation

## 2012-07-30 DIAGNOSIS — I2510 Atherosclerotic heart disease of native coronary artery without angina pectoris: Secondary | ICD-10-CM

## 2012-07-30 DIAGNOSIS — E785 Hyperlipidemia, unspecified: Secondary | ICD-10-CM | POA: Insufficient documentation

## 2012-07-30 DIAGNOSIS — R1011 Right upper quadrant pain: Secondary | ICD-10-CM | POA: Insufficient documentation

## 2012-07-30 DIAGNOSIS — I079 Rheumatic tricuspid valve disease, unspecified: Secondary | ICD-10-CM | POA: Insufficient documentation

## 2012-07-30 DIAGNOSIS — E119 Type 2 diabetes mellitus without complications: Secondary | ICD-10-CM | POA: Insufficient documentation

## 2012-07-30 DIAGNOSIS — F3289 Other specified depressive episodes: Secondary | ICD-10-CM | POA: Insufficient documentation

## 2012-07-30 DIAGNOSIS — I10 Essential (primary) hypertension: Secondary | ICD-10-CM | POA: Insufficient documentation

## 2012-07-30 DIAGNOSIS — N179 Acute kidney failure, unspecified: Secondary | ICD-10-CM | POA: Insufficient documentation

## 2012-07-30 DIAGNOSIS — I5022 Chronic systolic (congestive) heart failure: Secondary | ICD-10-CM

## 2012-07-30 DIAGNOSIS — N390 Urinary tract infection, site not specified: Secondary | ICD-10-CM | POA: Insufficient documentation

## 2012-07-30 MED ORDER — SPIRONOLACTONE 25 MG PO TABS: 12.5000 mg | ORAL_TABLET | Freq: Every day | ORAL | Status: DC

## 2012-07-30 NOTE — Progress Notes (Addendum)
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 Admitted with NSTEMI and cardiogenic shock in 12/12.Required inotropic support and 50 pound diuresis. CABG x 4 in January 2013 by Dr. Prescott Gum.  Discharged from [facility] 10/11/11 after she was treated for  volume depletion, hypotension and UTI.   Discharged from [facility] 11/15/11 after she was treated for  UTI and Hyperglycemia. Insulin was adjusted.   ECHO  11/21/11 35-40% 07/30/12  EF  35-40% Severe hypokinesis of inferior posterior wall  She returns for follow up a day.  Last visit carvedilol increased 9.375 mg BID and she is suppose to take lasix 40 mg for weight 180 pounds or greater.  Taking lasix several times a week for weight 180 pounds or more.  No orthopnea/PND.  No edema.  Able to complete ADLs without difficulty.    ROS: All systems negative except as listed in HPI, PMH and Problem List.  Past Medical History  Diagnosis Date  . Diabetes mellitus     on insulin, with h/o DKA   . HTN (hypertension)   . Hyperlipidemia   . Venous stasis ulcers   . Angina   . Hypothyroidism   . GERD (gastroesophageal reflux disease)   . Arthritis   . Anxiety   . Depression   . 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 Coronary artery disease   . Heart murmur     Dr Haroldine Laws cardiologist    Current Outpatient Prescriptions  Medication Sig Dispense Refill  . aspirin EC 81 MG tablet Take 81 mg by mouth daily.      . carvedilol (COREG) 6.25 MG tablet Take 1.5 tablets (9.375 mg total) by mouth 2 (two) times daily with a meal.  90 tablet  3  . clopidogrel (PLAVIX) 75 MG tablet Take 1 tablet (75 mg total) by mouth daily with breakfast.  30 tablet  11  . enalapril (VASOTEC) 2.5 MG tablet Take 1 tablet (2.5 mg total) by mouth 2 (two) times daily.  60 tablet  6  . escitalopram (LEXAPRO) 10 MG tablet Take 1  tablet (10 mg total) by mouth daily.  30 tablet  1  . furosemide (LASIX) 20 MG tablet Take 2 tablets (40 mg total) by mouth daily.  60 tablet  6  . insulin detemir (LEVEMIR) 100 UNIT/ML injection Inject 24 Units into the skin 2 (two) times daily.       . insulin lispro (HUMALOG) 100 UNIT/ML injection Inject 5-8 Units into the skin 3 (three) times daily before meals. Sliding scale      . levothyroxine (SYNTHROID, LEVOTHROID) 100 MCG tablet Take 1 tablet (100 mcg total) by mouth daily.  30 tablet  1  . pantoprazole (PROTONIX) 40 MG tablet Take 40 mg by mouth daily.      . potassium chloride (K-DUR) 10 MEQ tablet Take 1 tablet (10 mEq total) by mouth daily.  30 tablet  3  . rosuvastatin (CRESTOR) 40 MG tablet Take 1 tablet (40 mg total) by mouth daily.  30 tablet  6  . [DISCONTINUED] insulin detemir (LEVEMIR) 100 UNIT/ML injection Inject 9 Units into the skin every morning.  10 mL  0     PHYSICAL EXAM: Filed Vitals:   07/30/12 0929  BP: 136/60  Pulse: 82  Weight: 182 lb 12 oz (82.895 kg)  SpO2: 99%  General:  Well appearing. No resp difficulty HEENT: normal Neck: supple. JVP 7-8. Carotids 2+ bilaterally; no bruits. No lymphadenopathy or thryomegaly appreciated. Cor: PMI normal. Regular rate & rhythm. No rubs, gallops or murmurs. Lungs: clear Abdomen: soft, nontender, nondistended. No hepatosplenomegaly. No bruits or masses. Good bowel sounds. Extremities: no cyanosis, clubbing, rash, trace edema Neuro: alert & orientedx3, cranial nerves grossly intact. Moves all 4 extremities w/o difficulty. Affect pleasant.     ASSESSMENT & PLAN:

## 2012-07-30 NOTE — Progress Notes (Signed)
  Echocardiogram 2D Echocardiogram has been performed.  [name] 07/30/2012, 11:46 AM

## 2012-07-30 NOTE — Patient Instructions (Addendum)
Add spironolactone 0.5 tab daily.  Labs with Primary care next week.    Follow up 6 weeks

## 2012-07-30 NOTE — Assessment & Plan Note (Addendum)
Have reviewed today's echo in full.  EF remains stable at 35%.  NYHA II-III.  Volume status mildly elevated. Will add low dose spironolactone 12.5 mg daily.  Will continue to use lasix and potassium as needed for weight 180 pounds or more.  She will have a BMET next week at her PCP to check for hyperkalemia with addition of spiro.  Have discussed low sodium diet and fluid restrictions.  She voices understanding.  Follow up for further med titration, hopefully increase enalapril at follow up.  Patient seen and examined with Leone Haven, PA-C. We discussed all aspects of the encounter. I agree with the assessment and plan as stated above.  Echo reviewed personally in clinic EF 35-40%. NYHA II-III. Will titrate spiro. Reinforced need for daily weights and reviewed use of sliding scale diuretics.

## 2012-08-08 NOTE — Assessment & Plan Note (Signed)
Stable s/p CABG. No signs/sx of angina.

## 2012-09-10 ENCOUNTER — Ambulatory Visit (HOSPITAL_COMMUNITY)
Admission: RE | Admit: 2012-09-10 | Discharge: 2012-09-10 | Disposition: A | Discharge status: Home / Self Care | Payer: BC Managed Care – PPO | Source: Ambulatory Visit | Attending: Internal Medicine | Admitting: Internal Medicine

## 2012-09-10 ENCOUNTER — Encounter (HOSPITAL_COMMUNITY): Payer: Self-pay

## 2012-09-10 VITALS — BP 136/74 | HR 83 | Wt 189.0 lb

## 2012-09-10 DIAGNOSIS — Z7982 Long term (current) use of aspirin: Secondary | ICD-10-CM | POA: Insufficient documentation

## 2012-09-10 DIAGNOSIS — I2510 Atherosclerotic heart disease of native coronary artery without angina pectoris: Secondary | ICD-10-CM | POA: Insufficient documentation

## 2012-09-10 DIAGNOSIS — E039 Hypothyroidism, unspecified: Secondary | ICD-10-CM | POA: Insufficient documentation

## 2012-09-10 DIAGNOSIS — R011 Cardiac murmur, unspecified: Secondary | ICD-10-CM | POA: Insufficient documentation

## 2012-09-10 DIAGNOSIS — I5022 Chronic systolic (congestive) heart failure: Secondary | ICD-10-CM | POA: Insufficient documentation

## 2012-09-10 DIAGNOSIS — F329 Major depressive disorder, single episode, unspecified: Secondary | ICD-10-CM | POA: Insufficient documentation

## 2012-09-10 DIAGNOSIS — E119 Type 2 diabetes mellitus without complications: Secondary | ICD-10-CM | POA: Insufficient documentation

## 2012-09-10 DIAGNOSIS — K219 Gastro-esophageal reflux disease without esophagitis: Secondary | ICD-10-CM | POA: Insufficient documentation

## 2012-09-10 DIAGNOSIS — F3289 Other specified depressive episodes: Secondary | ICD-10-CM | POA: Insufficient documentation

## 2012-09-10 DIAGNOSIS — I10 Essential (primary) hypertension: Secondary | ICD-10-CM | POA: Insufficient documentation

## 2012-09-10 DIAGNOSIS — Z794 Long term (current) use of insulin: Secondary | ICD-10-CM | POA: Insufficient documentation

## 2012-09-10 DIAGNOSIS — I252 Old myocardial infarction: Secondary | ICD-10-CM | POA: Insufficient documentation

## 2012-09-10 DIAGNOSIS — F411 Generalized anxiety disorder: Secondary | ICD-10-CM | POA: Insufficient documentation

## 2012-09-10 MED ORDER — ENALAPRIL MALEATE 2.5 MG PO TABS: 5.0000 mg | ORAL_TABLET | Freq: Two times a day (BID) | ORAL | Status: DC

## 2012-09-10 NOTE — Patient Instructions (Addendum)
Take lasix for the next 2-3 days until weight improves.      Increase enalapril to 5 mg (2 tabs) twice daily.  Follow up 5-6 weeks.

## 2012-09-10 NOTE — Assessment & Plan Note (Signed)
Volume status elevated.  NYHA II-III.  Will give lasix 40 mg for 3 days then continue sliding scale.  Increase enalapril 5 mg twice daily.  Will get lab work from Tuesday at PCPs office.  Have discussed daily weights again.    Continue spiro.

## 2012-09-10 NOTE — Assessment & Plan Note (Signed)
No ischemic symptoms, continue ASA, carvedilol and crestor.

## 2012-09-10 NOTE — Progress Notes (Signed)
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 Admitted with NSTEMI and cardiogenic shock in 12/12.Required inotropic support and 50 pound diuresis. CABG x 4 in January 2013 by Dr. Prescott Gum.  Discharged from [facility] 10/11/11 after she was treated for  volume depletion, hypotension and UTI.   Discharged from [facility] 11/15/11 after she was treated for  UTI and Hyperglycemia. Insulin was adjusted.   ECHO  11/21/11 35-40% 07/30/12  EF  35-40% Severe hypokinesis of inferior posterior wall  She returns for follow up a day.  Last visit spiro 12.5 mg added.  Labs at PCP on Tuesday.  Taking lasix for SOB and edema.  Feeling a little depressed over the holidays.  No orthopnea/PND.  Some ankle edema.      ROS: All systems negative except as listed in HPI, PMH and Problem List.  Past Medical History  Diagnosis Date  . Diabetes mellitus     on insulin, with h/o DKA   . HTN (hypertension)   . Hyperlipidemia   . Venous stasis ulcers   . Angina   . Hypothyroidism   . GERD (gastroesophageal reflux disease)   . Arthritis   . Anxiety   . Depression   . 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 Coronary artery disease   . Heart murmur     Dr Haroldine Laws cardiologist    Current Outpatient Prescriptions  Medication Sig Dispense Refill  . aspirin EC 81 MG tablet Take 81 mg by mouth daily.      . carvedilol (COREG) 6.25 MG tablet Take 1.5 tablets (9.375 mg total) by mouth 2 (two) times daily with a meal.  90 tablet  3  . clopidogrel (PLAVIX) 75 MG tablet Take 1 tablet (75 mg total) by mouth daily with breakfast.  30 tablet  11  . enalapril (VASOTEC) 2.5 MG tablet Take 1 tablet (2.5 mg total) by mouth 2 (two) times daily.  60 tablet  6  . escitalopram (LEXAPRO) 10 MG tablet Take 1 tablet (10 mg total) by mouth daily.  30 tablet  1  . furosemide (LASIX) 20 MG  tablet Take 2 tablets (40 mg total) by mouth daily.  60 tablet  6  . insulin detemir (LEVEMIR) 100 UNIT/ML injection Inject 24 Units into the skin 2 (two) times daily.       . insulin lispro (HUMALOG) 100 UNIT/ML injection Inject 5-8 Units into the skin 3 (three) times daily before meals. Sliding scale      . levothyroxine (SYNTHROID, LEVOTHROID) 100 MCG tablet Take 1 tablet (100 mcg total) by mouth daily.  30 tablet  1  . pantoprazole (PROTONIX) 40 MG tablet Take 40 mg by mouth daily.      . potassium chloride (K-DUR) 10 MEQ tablet Take 1 tablet (10 mEq total) by mouth daily.  30 tablet  3  . rosuvastatin (CRESTOR) 40 MG tablet Take 1 tablet (40 mg total) by mouth daily.  30 tablet  6  . spironolactone (ALDACTONE) 25 MG tablet Take 0.5 tablets (12.5 mg total) by mouth daily.  15 tablet  3     PHYSICAL EXAM: Filed Vitals:   09/10/12 0905  BP: 136/74  Pulse: 83  Weight: 189 lb (85.73 kg)  SpO2: 98%    General:  Well appearing. No resp difficulty HEENT: normal Neck: supple. JVP 8-9.  Carotids 2+ bilaterally; no bruits. No lymphadenopathy or thryomegaly appreciated. Cor: PMI normal. Regular rate & rhythm. No rubs, gallops or murmurs. Lungs: clear Abdomen: soft, nontender, nondistended. No hepatosplenomegaly. No bruits or masses. Good bowel sounds. Extremities: no cyanosis, clubbing, rash, trace edema Neuro: alert & orientedx3, cranial nerves grossly intact. Moves all 4 extremities w/o difficulty. Affect pleasant.     ASSESSMENT & PLAN:

## 2012-09-10 NOTE — Assessment & Plan Note (Signed)
As above, increase enalapril 5 mg BID.

## 2012-10-22 ENCOUNTER — Encounter (HOSPITAL_COMMUNITY): Payer: BC Managed Care – PPO

## 2012-11-03 ENCOUNTER — Ambulatory Visit (HOSPITAL_COMMUNITY)
Admission: RE | Admit: 2012-11-03 | Discharge: 2012-11-03 | Disposition: A | Discharge status: Home / Self Care | Payer: BC Managed Care – PPO | Source: Ambulatory Visit | Attending: Internal Medicine | Admitting: Internal Medicine

## 2012-11-03 VITALS — BP 142/60 | HR 72 | Wt 186.5 lb

## 2012-11-03 DIAGNOSIS — I2510 Atherosclerotic heart disease of native coronary artery without angina pectoris: Secondary | ICD-10-CM

## 2012-11-03 DIAGNOSIS — I5022 Chronic systolic (congestive) heart failure: Secondary | ICD-10-CM | POA: Insufficient documentation

## 2012-11-03 MED ORDER — CARVEDILOL 12.5 MG PO TABS: 12.5000 mg | ORAL_TABLET | Freq: Two times a day (BID) | ORAL | Status: DC

## 2012-11-03 NOTE — Patient Instructions (Addendum)
Take Carvedilol 12.5 mg twice a day  Follow up in 2 months  Do the following things EVERYDAY: 1) Weigh yourself in the morning before breakfast. Write it down and keep it in a log. 2) Take your medicines as prescribed 3) Eat low salt foods-Limit salt (sodium) to 2000 mg per day.  4) Stay as active as you can everyday 5) Limit all fluids for the day to less than 2 liters

## 2012-11-03 NOTE — Progress Notes (Signed)
Patient ID: Bridget Martinez, female   DOB: 1951/08/04, 62 y.o.   MRN: [id number] PCP: Dr Laurance Flatten Endocrinologist: Dr Buddy Duty  HPI: Bridget Martinez is a 62 yo woman with DM2, HTN, hypothyroidism, depression andCHF due to ICM and EF ~20-25%. Admitted with NSTEMI and cardiogenic shock in 12/12.Required inotropic support and 50 pound diuresis. CABG x 4 in January 2013 by Dr. Prescott Gum.   ECHO  11/21/11 35-40% 07/30/12  EF  45-50% Severe hypokinesis of inferior posterior wall  She returns for follow up a day. Last visit volume status elevated she was given lasix 40 mg x 3 days and enalapril increased to 5 mg twice a day. Denies SOB/PND/Orthopnea/CP. Able to walk up steps.  Weight at home 185-189 pounds. She continues to take Lasix 40 mg daily. Complaint with medications  but she did not take prior to this appointment. Trying to follow low salt food choices. Increased appetite. She continues to follow with Dr Buddy Duty DM. Not exercising.         ROS: All systems negative except as listed in HPI, PMH and Problem List.  Past Medical History  Diagnosis Date  . Diabetes mellitus     on insulin, with h/o DKA   . HTN (hypertension)   . Hyperlipidemia   . Venous stasis ulcers   . Angina   . Hypothyroidism   . GERD (gastroesophageal reflux disease)   . Arthritis   . Anxiety   . Depression   . 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 Coronary artery disease   . Heart murmur     Dr Haroldine Laws cardiologist    Current Outpatient Prescriptions  Medication Sig Dispense Refill  . alendronate (FOSAMAX) 70 MG tablet Take 70 mg by mouth every 7 (seven) days. Take with a full glass of water on an empty stomach.      Marland Kitchen aspirin EC 81 MG tablet Take 81 mg by mouth daily.      . carvedilol (COREG) 6.25 MG tablet Take 1.5 tablets (9.375 mg total) by mouth 2 (two) times daily with a meal.  90 tablet  3  . clopidogrel (PLAVIX) 75 MG tablet Take 1 tablet (75 mg total) by mouth  daily with breakfast.  30 tablet  11  . enalapril (VASOTEC) 2.5 MG tablet Take 2 tablets (5 mg total) by mouth 2 (two) times daily.  60 tablet  6  . escitalopram (LEXAPRO) 10 MG tablet Take 1 tablet (10 mg total) by mouth daily.  30 tablet  1  . furosemide (LASIX) 20 MG tablet Take 2 tablets (40 mg total) by mouth daily.  60 tablet  6  . insulin detemir (LEVEMIR) 100 UNIT/ML injection Inject 24 Units into the skin 2 (two) times daily.       . insulin lispro (HUMALOG) 100 UNIT/ML injection Inject 5-8 Units into the skin 3 (three) times daily before meals. Sliding scale      . levothyroxine (SYNTHROID, LEVOTHROID) 100 MCG tablet Take 1 tablet (100 mcg total) by mouth daily.  30 tablet  1  . pantoprazole (PROTONIX) 40 MG tablet Take 40 mg by mouth daily.      . potassium chloride (K-DUR) 10 MEQ tablet Take 1 tablet (10 mEq total) by mouth daily.  30 tablet  3  . rosuvastatin (CRESTOR) 40 MG tablet Take 1 tablet (40 mg total) by mouth daily.  30 tablet  6  . spironolactone (ALDACTONE) 25 MG tablet Take  0.5 tablets (12.5 mg total) by mouth daily.  15 tablet  3   No current facility-administered medications for this encounter.     PHYSICAL EXAM: Filed Vitals:   11/03/12 0940  BP: 142/60  Pulse: 72  Weight: 186 lb 8 oz (84.596 kg)  SpO2: 97%    General:  Well appearing. No resp difficulty HEENT: normal Neck: supple. JVP 8-9. Carotids 2+ bilaterally; no bruits. No lymphadenopathy or thryomegaly appreciated. Cor: PMI normal. Regular rate & rhythm. No rubs, gallops or murmurs. Lungs: clear Abdomen: soft, nontender, nondistended. No hepatosplenomegaly. No bruits or masses. Good bowel sounds. Extremities: no cyanosis, clubbing, rash, trace edema Neuro: alert & orientedx3, cranial nerves grossly intact. Moves all 4 extremities w/o difficulty. Affect pleasant.     ASSESSMENT & PLAN:

## 2012-11-03 NOTE — Assessment & Plan Note (Addendum)
NYHA II. Volume status stable despite weight gain. Continue current diuretic regimen. Increase carvedilol 12.5 mg twice a day. Reinforced low salt food choices, limiting fluid intake < 2 liters per day, and daily weights. Encouraged to start exercising 15 minutes per day by walking. She will contact PCP for recent BMET. Follow up in 2 months.

## 2012-11-03 NOTE — Assessment & Plan Note (Signed)
No ischemic symptoms, continue ASA, carvedilol and crestor.

## 2012-12-15 ENCOUNTER — Other Ambulatory Visit (HOSPITAL_COMMUNITY): Payer: Self-pay | Admitting: Physician Assistant

## 2012-12-29 ENCOUNTER — Telehealth: Payer: Self-pay | Admitting: Nurse Practitioner

## 2012-12-31 ENCOUNTER — Ambulatory Visit (HOSPITAL_COMMUNITY)
Admission: RE | Admit: 2012-12-31 | Discharge: 2012-12-31 | Disposition: A | Discharge status: Home / Self Care | Payer: BC Managed Care – PPO | Source: Ambulatory Visit | Attending: Internal Medicine | Admitting: Internal Medicine

## 2012-12-31 VITALS — BP 122/58 | HR 85 | Wt 191.5 lb

## 2012-12-31 DIAGNOSIS — I5022 Chronic systolic (congestive) heart failure: Secondary | ICD-10-CM | POA: Insufficient documentation

## 2012-12-31 DIAGNOSIS — I2510 Atherosclerotic heart disease of native coronary artery without angina pectoris: Secondary | ICD-10-CM

## 2012-12-31 DIAGNOSIS — I509 Heart failure, unspecified: Secondary | ICD-10-CM | POA: Insufficient documentation

## 2012-12-31 DIAGNOSIS — I10 Essential (primary) hypertension: Secondary | ICD-10-CM

## 2012-12-31 MED ORDER — SPIRONOLACTONE 25 MG PO TABS: 25.0000 mg | ORAL_TABLET | Freq: Every day | ORAL | Status: DC

## 2012-12-31 NOTE — Progress Notes (Signed)
PCP: Dr Laurance Flatten Endocrinologist: Dr Buddy Duty  HPI: Bridget Martinez is a 62 yo woman with DM2, HTN, hypothyroidism, depression andCHF due to ICM and EF ~20-25%. Admitted with NSTEMI and cardiogenic shock in 12/12.Required inotropic support and 50 pound diuresis. CABG x 4 in January 2013 by Dr. Prescott Gum.   ECHO  11/21/11 35-40% 07/30/12  EF  45-50% Severe hypokinesis of inferior posterior wall  She returns for follow up a day. Last visit carvedilol increased to 12.5 mg BID.  Tolerated well.  She has been taking fluid pills daily due to increased swelling which has resolved.  She says her breathing is good.  She is now going to the grocery story by herself without difficulty.  She has been having some trouble with her blood sugars lately.  She is going to follow up with Dr. Buddy Duty  ROS: All systems negative except as listed in HPI, PMH and Problem List.  Past Medical History  Diagnosis Date  . Diabetes mellitus     on insulin, with h/o DKA   . HTN (hypertension)   . Hyperlipidemia   . Venous stasis ulcers   . Angina   . Hypothyroidism   . GERD (gastroesophageal reflux disease)   . Arthritis   . Anxiety   . Depression   . 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 Coronary artery disease   . Heart murmur     Dr Haroldine Laws cardiologist    Current Outpatient Prescriptions  Medication Sig Dispense Refill  . alendronate (FOSAMAX) 70 MG tablet Take 70 mg by mouth every 7 (seven) days. Take with a full glass of water on an empty stomach.      Marland Kitchen aspirin EC 81 MG tablet Take 81 mg by mouth daily.      . carvedilol (COREG) 12.5 MG tablet Take 1 tablet (12.5 mg total) by mouth 2 (two) times daily with a meal.  60 tablet  3  . clopidogrel (PLAVIX) 75 MG tablet Take 1 tablet (75 mg total) by mouth daily with breakfast.  30 tablet  11  . enalapril (VASOTEC) 2.5 MG tablet Take 2 tablets (5 mg total) by mouth 2 (two) times daily.  60 tablet  6  . escitalopram  (LEXAPRO) 10 MG tablet Take 1 tablet (10 mg total) by mouth daily.  30 tablet  1  . furosemide (LASIX) 20 MG tablet Take 2 tablets (40 mg total) by mouth daily.  60 tablet  6  . insulin detemir (LEVEMIR) 100 UNIT/ML injection Inject 24 Units into the skin 2 (two) times daily.       . insulin lispro (HUMALOG) 100 UNIT/ML injection Inject 5-8 Units into the skin 3 (three) times daily before meals. Sliding scale      . levothyroxine (SYNTHROID, LEVOTHROID) 100 MCG tablet Take 1 tablet (100 mcg total) by mouth daily.  30 tablet  1  . pantoprazole (PROTONIX) 40 MG tablet Take 40 mg by mouth daily.      . potassium chloride (K-DUR) 10 MEQ tablet Take 1 tablet (10 mEq total) by mouth daily.  30 tablet  3  . rosuvastatin (CRESTOR) 40 MG tablet Take 1 tablet (40 mg total) by mouth daily.  30 tablet  6  . spironolactone (ALDACTONE) 25 MG tablet TAKE 1/2 TABLET DAILY  15 tablet  6   No current facility-administered medications for this encounter.     PHYSICAL EXAM: Filed Vitals:   12/31/12 0844  BP:  122/58  Pulse: 85  Weight: 191 lb 8 oz (86.864 kg)  SpO2: 97%    General:  Well appearing. No resp difficulty HEENT: normal Neck: supple. JVP 7-8 Carotids 2+ bilaterally; no bruits. No lymphadenopathy or thryomegaly appreciated. Cor: PMI normal. Regular rate & rhythm. No rubs, gallops or murmurs. Lungs: clear Abdomen: soft, nontender, nondistended. No hepatosplenomegaly. No bruits or masses. Good bowel sounds. Extremities: no cyanosis, clubbing, rash, trace edema Neuro: alert & orientedx3, cranial nerves grossly intact. Moves all 4 extremities w/o difficulty. Affect pleasant.     ASSESSMENT & PLAN:

## 2012-12-31 NOTE — Telephone Encounter (Signed)
notified

## 2012-12-31 NOTE — Assessment & Plan Note (Signed)
Well-controlled on current regimen. ?

## 2012-12-31 NOTE — Assessment & Plan Note (Signed)
No ischemic symptoms.  Continue ASA, crestor and carvedilol.

## 2012-12-31 NOTE — Assessment & Plan Note (Signed)
NYHA II.  Volume status looks good today.  With recent edema will increase spironolactone 25 mg daily.  Have also discussed taking lasix around 3 or 4 pm instead of at night so that she is not up using the bathroom all night.  Will have her check labs next week at Dr. Tawanna Sat office after increase in spironolactone.  Encouraged her to keep weighing and following low sodium diet.

## 2012-12-31 NOTE — Patient Instructions (Addendum)
Increase spironolactone 25 mg daily.  Follow up labs next week at Dr. Tawanna Sat   Follow up 2 months.

## 2013-02-17 ENCOUNTER — Encounter (HOSPITAL_COMMUNITY): Payer: Self-pay | Admitting: *Deleted

## 2013-03-16 ENCOUNTER — Other Ambulatory Visit (HOSPITAL_COMMUNITY): Payer: Self-pay | Admitting: *Deleted

## 2013-03-16 MED ORDER — ENALAPRIL MALEATE 5 MG PO TABS: 5.0000 mg | ORAL_TABLET | Freq: Two times a day (BID) | ORAL | Status: DC

## 2013-03-17 ENCOUNTER — Other Ambulatory Visit (HOSPITAL_COMMUNITY): Payer: Self-pay | Admitting: *Deleted

## 2013-03-22 ENCOUNTER — Other Ambulatory Visit (HOSPITAL_COMMUNITY): Payer: Self-pay | Admitting: Physician Assistant

## 2013-03-22 ENCOUNTER — Other Ambulatory Visit (HOSPITAL_COMMUNITY): Payer: Self-pay | Admitting: *Deleted

## 2013-03-22 MED ORDER — CARVEDILOL 12.5 MG PO TABS: 12.5000 mg | ORAL_TABLET | Freq: Two times a day (BID) | ORAL | Status: DC

## 2013-04-14 ENCOUNTER — Ambulatory Visit (HOSPITAL_COMMUNITY): Payer: BC Managed Care – PPO

## 2013-05-03 ENCOUNTER — Other Ambulatory Visit: Payer: Self-pay | Admitting: *Deleted

## 2013-05-03 MED ORDER — LEVOTHYROXINE SODIUM 125 MCG PO TABS: 125.0000 ug | ORAL_TABLET | Freq: Every day | ORAL | Status: DC

## 2013-05-03 NOTE — Telephone Encounter (Signed)
LAST THYROID 12/13.

## 2013-05-06 ENCOUNTER — Encounter (HOSPITAL_COMMUNITY): Payer: Self-pay

## 2013-05-06 ENCOUNTER — Ambulatory Visit (HOSPITAL_COMMUNITY)
Admission: RE | Admit: 2013-05-06 | Discharge: 2013-05-06 | Disposition: A | Discharge status: Home / Self Care | Payer: BC Managed Care – PPO | Source: Ambulatory Visit | Attending: Internal Medicine | Admitting: Internal Medicine

## 2013-05-06 VITALS — BP 118/52 | HR 77 | Wt 193.1 lb

## 2013-05-06 DIAGNOSIS — M129 Arthropathy, unspecified: Secondary | ICD-10-CM | POA: Insufficient documentation

## 2013-05-06 DIAGNOSIS — L98499 Non-pressure chronic ulcer of skin of other sites with unspecified severity: Secondary | ICD-10-CM | POA: Insufficient documentation

## 2013-05-06 DIAGNOSIS — I509 Heart failure, unspecified: Secondary | ICD-10-CM | POA: Insufficient documentation

## 2013-05-06 DIAGNOSIS — Z951 Presence of aortocoronary bypass graft: Secondary | ICD-10-CM | POA: Insufficient documentation

## 2013-05-06 DIAGNOSIS — I10 Essential (primary) hypertension: Secondary | ICD-10-CM

## 2013-05-06 DIAGNOSIS — Z79899 Other long term (current) drug therapy: Secondary | ICD-10-CM | POA: Insufficient documentation

## 2013-05-06 DIAGNOSIS — Z7982 Long term (current) use of aspirin: Secondary | ICD-10-CM | POA: Insufficient documentation

## 2013-05-06 DIAGNOSIS — I252 Old myocardial infarction: Secondary | ICD-10-CM | POA: Insufficient documentation

## 2013-05-06 DIAGNOSIS — I5022 Chronic systolic (congestive) heart failure: Secondary | ICD-10-CM | POA: Insufficient documentation

## 2013-05-06 DIAGNOSIS — Z7902 Long term (current) use of antithrombotics/antiplatelets: Secondary | ICD-10-CM | POA: Insufficient documentation

## 2013-05-06 DIAGNOSIS — K219 Gastro-esophageal reflux disease without esophagitis: Secondary | ICD-10-CM | POA: Insufficient documentation

## 2013-05-06 DIAGNOSIS — I2589 Other forms of chronic ischemic heart disease: Secondary | ICD-10-CM | POA: Insufficient documentation

## 2013-05-06 DIAGNOSIS — F3289 Other specified depressive episodes: Secondary | ICD-10-CM | POA: Insufficient documentation

## 2013-05-06 DIAGNOSIS — E039 Hypothyroidism, unspecified: Secondary | ICD-10-CM | POA: Insufficient documentation

## 2013-05-06 DIAGNOSIS — I872 Venous insufficiency (chronic) (peripheral): Secondary | ICD-10-CM | POA: Insufficient documentation

## 2013-05-06 DIAGNOSIS — E785 Hyperlipidemia, unspecified: Secondary | ICD-10-CM | POA: Insufficient documentation

## 2013-05-06 DIAGNOSIS — F329 Major depressive disorder, single episode, unspecified: Secondary | ICD-10-CM | POA: Insufficient documentation

## 2013-05-06 DIAGNOSIS — Z794 Long term (current) use of insulin: Secondary | ICD-10-CM | POA: Insufficient documentation

## 2013-05-06 DIAGNOSIS — I2510 Atherosclerotic heart disease of native coronary artery without angina pectoris: Secondary | ICD-10-CM

## 2013-05-06 DIAGNOSIS — F411 Generalized anxiety disorder: Secondary | ICD-10-CM | POA: Insufficient documentation

## 2013-05-06 DIAGNOSIS — E119 Type 2 diabetes mellitus without complications: Secondary | ICD-10-CM | POA: Insufficient documentation

## 2013-05-06 NOTE — Progress Notes (Signed)
Patient ID: SHELEA MUGG, female   DOB: [birth year], 62 y.o.   MRN: [id number] PCP: Dr Laurance Flatten Endocrinologist: Dr Buddy Duty  HPI: Mrs. Cheatham is a 62 yo woman with DM2, HTN, hypothyroidism, depression andCHF due to ICM and EF ~20-25%. Admitted with NSTEMI and cardiogenic shock in 12/12.Required inotropic support and 50 pound diuresis. CABG x 4 in January 2013 by Dr. Prescott Gum.  ECHO  11/21/11 35-40% 07/30/12  EF  45-50% Severe hypokinesis of inferior posterior wall  Labs 01/2013: K+ 4.0, Creatinine 1.31, BUN 35  Follow up: Since last visit increased spironolactone to 25 mg daily. Saw Dr. Buddy Duty this week and was started on Cipro for UTI and insulin has been increased. Not sure what dose of carvedilol or enalapril she is on will check when she gets home (our record says one thing and Dr. Kathi Ludwig says something else). Doing pretty good. Denies SOB, CP, orthopnea, or edema.  ROS: All systems negative except as listed in HPI, PMH and Problem List.  Past Medical History  Diagnosis Date  . Diabetes mellitus     on insulin, with h/o DKA   . HTN (hypertension)   . Hyperlipidemia   . Venous stasis ulcers   . Angina   . Hypothyroidism   . GERD (gastroesophageal reflux disease)   . Arthritis   . Anxiety   . Depression   . 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 Coronary artery disease   . Heart murmur     Dr Haroldine Laws cardiologist    Current Outpatient Prescriptions  Medication Sig Dispense Refill  . alendronate (FOSAMAX) 70 MG tablet Take 70 mg by mouth every 7 (seven) days. Take with a full glass of water on an empty stomach.      Marland Kitchen aspirin EC 81 MG tablet Take 81 mg by mouth daily.      . carvedilol (COREG) 12.5 MG tablet Take 1 tablet (12.5 mg total) by mouth 2 (two) times daily with a meal.  60 tablet  3  . clopidogrel (PLAVIX) 75 MG tablet Take 1 tablet (75 mg total) by mouth daily with breakfast.  30 tablet  11  . CRESTOR 40 MG tablet TAKE 1  TABLET (40 MG TOTAL) BY MOUTH DAILY.  30 tablet  6  . enalapril (VASOTEC) 5 MG tablet Take 1 tablet (5 mg total) by mouth 2 (two) times daily.  60 tablet  6  . escitalopram (LEXAPRO) 10 MG tablet Take 1 tablet (10 mg total) by mouth daily.  30 tablet  1  . furosemide (LASIX) 20 MG tablet Take 2 tablets (40 mg total) by mouth daily.  60 tablet  6  . insulin detemir (LEVEMIR) 100 UNIT/ML injection Inject 2 Units into the skin. 26 units in AM and 24 units in PM      . insulin lispro (HUMALOG) 100 UNIT/ML injection Inject 5-8 Units into the skin 3 (three) times daily before meals. Sliding scale      . levothyroxine (SYNTHROID, LEVOTHROID) 125 MCG tablet Take 1 tablet (125 mcg total) by mouth daily.  90 tablet  0  . pantoprazole (PROTONIX) 40 MG tablet Take 40 mg by mouth daily.      . potassium chloride (K-DUR) 10 MEQ tablet Take 1 tablet (10 mEq total) by mouth daily.  30 tablet  3  . spironolactone (ALDACTONE) 25 MG tablet Take 1 tablet (25 mg total) by mouth daily.  30 tablet  3  No current facility-administered medications for this encounter.   PHYSICAL EXAM: Filed Vitals:   05/06/13 0900  BP: 118/52  Pulse: 77  Weight: 193 lb 1.9 oz (87.599 kg)  SpO2: 98%   General:  Well appearing. No resp difficulty HEENT: normal Neck: supple. JVP 7 Carotids 2+ bilaterally; no bruits. No lymphadenopathy or thryomegaly appreciated. Cor: PMI normal. Regular rate & rhythm. No rubs, gallops or murmurs. Lungs: clear Abdomen: soft, nontender, nondistended. No hepatosplenomegaly. No bruits or masses. Good bowel sounds. Extremities: no cyanosis, clubbing, rash, trace edema Neuro: alert & orientedx3, cranial nerves grossly intact. Moves all 4 extremities w/o difficulty. Affect pleasant.  ASSESSMENT & PLAN:   1) Chronic systolic HF, EF [id number] ([id number]) - Doing great. NYHA I symptoms and volume status good.  - Not sure of patient's current coreg or lisinopril dose. Will not titrate any medications at this  time. Have asked the patient to call back and confirm current doses. - Reinforced the need and importance of daily weights, a low sodium diet, and fluid restriction (less than 2 L a day). Instructed to call the HF clinic if weight increases more than 3 lbs overnight or 5 lbs in a week.  - Follow up 4 months with ECHO  2) HTN - Controlled on BB, ACE-I, Arlyce Harman - As above patient to call to confirm home doses of medications.  3) HLD- -Managed by [facility].  4) CAD s/p CABG x 4 - Continue plavix, ASA, BB, ACE-I and statin  Junie Bame B NP-C 11:56 AM

## 2013-05-06 NOTE — Patient Instructions (Addendum)
Doing great, keep up the good work.  Call when you get home to confirm synthroid, carvedilol and enalapril doses.  Follow up 4 months with ECHO.  Do the following things EVERYDAY: 1) Weigh yourself in the morning before breakfast. Write it down and keep it in a log. 2) Take your medicines as prescribed 3) Eat low salt foods-Limit salt (sodium) to 2000 mg per day.  4) Stay as active as you can everyday 5) Limit all fluids for the day to less than 2 liters 6)

## 2013-07-20 ENCOUNTER — Other Ambulatory Visit (HOSPITAL_COMMUNITY): Payer: Self-pay | Admitting: Internal Medicine

## 2013-08-29 ENCOUNTER — Other Ambulatory Visit: Payer: Self-pay | Admitting: Nurse Practitioner

## 2013-08-31 NOTE — Telephone Encounter (Signed)
Last thyroid 08/29/12   DFS

## 2013-09-03 ENCOUNTER — Encounter (HOSPITAL_COMMUNITY): Payer: Self-pay | Admitting: Emergency Medicine

## 2013-09-03 ENCOUNTER — Emergency Department (HOSPITAL_COMMUNITY): Payer: BC Managed Care – PPO

## 2013-09-03 ENCOUNTER — Inpatient Hospital Stay (HOSPITAL_COMMUNITY): Payer: BC Managed Care – PPO

## 2013-09-03 ENCOUNTER — Inpatient Hospital Stay (HOSPITAL_COMMUNITY)
Admission: EM | Admit: 2013-09-03 | Discharge: 2013-09-07 | DRG: 871 | Disposition: A | Discharge status: Home / Self Care | Payer: BC Managed Care – PPO | Attending: Internal Medicine | Admitting: Internal Medicine

## 2013-09-03 DIAGNOSIS — E785 Hyperlipidemia, unspecified: Secondary | ICD-10-CM | POA: Diagnosis present

## 2013-09-03 DIAGNOSIS — D72829 Elevated white blood cell count, unspecified: Secondary | ICD-10-CM

## 2013-09-03 DIAGNOSIS — Z951 Presence of aortocoronary bypass graft: Secondary | ICD-10-CM

## 2013-09-03 DIAGNOSIS — I2510 Atherosclerotic heart disease of native coronary artery without angina pectoris: Secondary | ICD-10-CM | POA: Diagnosis present

## 2013-09-03 DIAGNOSIS — A419 Sepsis, unspecified organism: Principal | ICD-10-CM | POA: Diagnosis present

## 2013-09-03 DIAGNOSIS — E1110 Type 2 diabetes mellitus with ketoacidosis without coma: Secondary | ICD-10-CM | POA: Diagnosis present

## 2013-09-03 DIAGNOSIS — J9600 Acute respiratory failure, unspecified whether with hypoxia or hypercapnia: Secondary | ICD-10-CM | POA: Diagnosis present

## 2013-09-03 DIAGNOSIS — R791 Abnormal coagulation profile: Secondary | ICD-10-CM | POA: Diagnosis present

## 2013-09-03 DIAGNOSIS — I5043 Acute on chronic combined systolic (congestive) and diastolic (congestive) heart failure: Secondary | ICD-10-CM | POA: Diagnosis present

## 2013-09-03 DIAGNOSIS — R579 Shock, unspecified: Secondary | ICD-10-CM | POA: Diagnosis present

## 2013-09-03 DIAGNOSIS — I214 Non-ST elevation (NSTEMI) myocardial infarction: Secondary | ICD-10-CM | POA: Diagnosis present

## 2013-09-03 DIAGNOSIS — G9340 Encephalopathy, unspecified: Secondary | ICD-10-CM | POA: Diagnosis present

## 2013-09-03 DIAGNOSIS — E875 Hyperkalemia: Secondary | ICD-10-CM | POA: Diagnosis not present

## 2013-09-03 DIAGNOSIS — E039 Hypothyroidism, unspecified: Secondary | ICD-10-CM | POA: Diagnosis present

## 2013-09-03 DIAGNOSIS — D649 Anemia, unspecified: Secondary | ICD-10-CM | POA: Diagnosis present

## 2013-09-03 DIAGNOSIS — E1010 Type 1 diabetes mellitus with ketoacidosis without coma: Secondary | ICD-10-CM | POA: Diagnosis present

## 2013-09-03 DIAGNOSIS — R4182 Altered mental status, unspecified: Secondary | ICD-10-CM | POA: Diagnosis present

## 2013-09-03 DIAGNOSIS — Z7982 Long term (current) use of aspirin: Secondary | ICD-10-CM

## 2013-09-03 DIAGNOSIS — E876 Hypokalemia: Secondary | ICD-10-CM | POA: Diagnosis not present

## 2013-09-03 DIAGNOSIS — I959 Hypotension, unspecified: Secondary | ICD-10-CM

## 2013-09-03 DIAGNOSIS — R680 Hypothermia, not associated with low environmental temperature: Secondary | ICD-10-CM | POA: Diagnosis present

## 2013-09-03 DIAGNOSIS — Z79899 Other long term (current) drug therapy: Secondary | ICD-10-CM

## 2013-09-03 DIAGNOSIS — Z7902 Long term (current) use of antithrombotics/antiplatelets: Secondary | ICD-10-CM

## 2013-09-03 DIAGNOSIS — T68XXXA Hypothermia, initial encounter: Secondary | ICD-10-CM

## 2013-09-03 DIAGNOSIS — A088 Other specified intestinal infections: Secondary | ICD-10-CM | POA: Diagnosis present

## 2013-09-03 DIAGNOSIS — N179 Acute kidney failure, unspecified: Secondary | ICD-10-CM | POA: Diagnosis present

## 2013-09-03 DIAGNOSIS — Z794 Long term (current) use of insulin: Secondary | ICD-10-CM

## 2013-09-03 DIAGNOSIS — E860 Dehydration: Secondary | ICD-10-CM | POA: Diagnosis present

## 2013-09-03 DIAGNOSIS — I369 Nonrheumatic tricuspid valve disorder, unspecified: Secondary | ICD-10-CM

## 2013-09-03 DIAGNOSIS — I10 Essential (primary) hypertension: Secondary | ICD-10-CM | POA: Diagnosis present

## 2013-09-03 HISTORY — DX: Essential (primary) hypertension: I10

## 2013-09-03 HISTORY — DX: Heart failure, unspecified: I50.9

## 2013-09-03 HISTORY — DX: Pneumonia, unspecified organism: J18.9

## 2013-09-03 LAB — GLUCOSE, CAPILLARY
Glucose-Capillary: >600 mg/dL (ref 70–99)
Glucose-Capillary: 270 mg/dL — ABNORMAL HIGH (ref 70–99)

## 2013-09-03 LAB — CBC
HCT: 34.4 % — ABNORMAL LOW (ref 36.0–46.0)
HCT: 30 % — ABNORMAL LOW (ref 36.0–46.0)
HCT: 28 % — ABNORMAL LOW (ref 36.0–46.0)
Hemoglobin: 10 g/dL — ABNORMAL LOW (ref 12.0–15.0)
Hemoglobin: 9 g/dL — ABNORMAL LOW (ref 12.0–15.0)
MCH: 29.2 pg (ref 26.0–34.0)
MCH: 28.8 pg (ref 26.0–34.0)
MCHC: 29.1 g/dL — ABNORMAL LOW (ref 30.0–36.0)
MCHC: 32.1 g/dL (ref 30.0–36.0)
MCV: 100.3 fL — ABNORMAL HIGH (ref 78.0–100.0)
MCV: 89.7 fL (ref 78.0–100.0)
Platelets: 337 10*3/uL (ref 150–400)
RBC: 3.25 MIL/uL — ABNORMAL LOW (ref 3.87–5.11)
RBC: 3.12 MIL/uL — ABNORMAL LOW (ref 3.87–5.11)
RDW: 14.5 % (ref 11.5–15.5)
WBC: 19.8 10*3/uL — ABNORMAL HIGH (ref 4.0–10.5)
WBC: 16.9 10*3/uL — ABNORMAL HIGH (ref 4.0–10.5)

## 2013-09-03 LAB — POCT I-STAT 3, ART BLOOD GAS (G3+)
Acid-base deficit: 25 mmol/L — ABNORMAL HIGH (ref 0.0–2.0)
Acid-base deficit: 16 mmol/L — ABNORMAL HIGH (ref 0.0–2.0)
Bicarbonate: 5.5 mEq/L — ABNORMAL LOW (ref 20.0–24.0)
Bicarbonate: 8.8 mEq/L — ABNORMAL LOW (ref 20.0–24.0)
O2 Saturation: 100 %
O2 Saturation: 98 %
O2 Saturation: 99 %
TCO2: 6 mmol/L (ref 0–100)
TCO2: 6 mmol/L (ref 0–100)
TCO2: 9 mmol/L (ref 0–100)
pCO2 arterial: 24 mmHg — ABNORMAL LOW (ref 35.0–45.0)
pCO2 arterial: 25.6 mmHg — ABNORMAL LOW (ref 35.0–45.0)
pCO2 arterial: 18.9 mmHg — CL (ref 35.0–45.0)
pH, Arterial: 6.949 — CL (ref 7.350–7.450)
pO2, Arterial: 496 mmHg — ABNORMAL HIGH (ref 80.0–100.0)

## 2013-09-03 LAB — BASIC METABOLIC PANEL
BUN: 38 mg/dL — ABNORMAL HIGH (ref 6–23)
Calcium: 6.9 mg/dL — ABNORMAL LOW (ref 8.4–10.5)
Calcium: 6.8 mg/dL — ABNORMAL LOW (ref 8.4–10.5)
Chloride: 105 mEq/L (ref 96–112)
Creatinine, Ser: 1.32 mg/dL — ABNORMAL HIGH (ref 0.50–1.10)
GFR calc Af Amer: 49 mL/min — ABNORMAL LOW (ref >90)
GFR calc Af Amer: 49 mL/min — ABNORMAL LOW (ref >90)
GFR calc non Af Amer: 42 mL/min — ABNORMAL LOW (ref >90)
Glucose, Bld: 504 mg/dL — ABNORMAL HIGH (ref 70–99)
Glucose, Bld: 408 mg/dL — ABNORMAL HIGH (ref 70–99)
Potassium: 4.1 mEq/L (ref 3.5–5.1)
Sodium: 142 mEq/L (ref 135–145)

## 2013-09-03 LAB — URINALYSIS, ROUTINE W REFLEX MICROSCOPIC
Glucose, UA: >1000 mg/dL — AB
Hgb urine dipstick: NEGATIVE
Leukocytes, UA: NEGATIVE
Nitrite: NEGATIVE
Protein, ur: NEGATIVE mg/dL
Specific Gravity, Urine: 1.023 (ref 1.005–1.030)
pH: 5 (ref 5.0–8.0)

## 2013-09-03 LAB — COMPREHENSIVE METABOLIC PANEL
AST: 12 U/L (ref 0–37)
BUN: 47 mg/dL — ABNORMAL HIGH (ref 6–23)
CO2: <7 mEq/L — CL (ref 19–32)
Calcium: 8.3 mg/dL — ABNORMAL LOW (ref 8.4–10.5)
Creatinine, Ser: 1.72 mg/dL — ABNORMAL HIGH (ref 0.50–1.10)
GFR calc Af Amer: 35 mL/min — ABNORMAL LOW (ref >90)
GFR calc non Af Amer: 30 mL/min — ABNORMAL LOW (ref >90)
Glucose, Bld: 1011 mg/dL (ref 70–99)
Sodium: 125 mEq/L — ABNORMAL LOW (ref 135–145)
Total Protein: 6.4 g/dL (ref 6.0–8.3)

## 2013-09-03 LAB — POCT I-STAT 3, VENOUS BLOOD GAS (G3P V)
Acid-base deficit: 29 mmol/L — ABNORMAL HIGH (ref 0.0–2.0)
Bicarbonate: 3.8 mEq/L — ABNORMAL LOW (ref 20.0–24.0)
TCO2: <5 mmol/L (ref 0–100)
pCO2, Ven: 22.9 mmHg — ABNORMAL LOW (ref 45.0–50.0)
pH, Ven: 6.827 — CL (ref 7.250–7.300)
pO2, Ven: 83 mmHg — ABNORMAL HIGH (ref 30.0–45.0)

## 2013-09-03 LAB — POCT I-STAT, CHEM 8
BUN: 45 mg/dL — ABNORMAL HIGH (ref 6–23)
Chloride: 105 mEq/L (ref 96–112)
Creatinine, Ser: 1.7 mg/dL — ABNORMAL HIGH (ref 0.50–1.10)
Glucose, Bld: >700 mg/dL (ref 70–99)
Potassium: 8.1 mEq/L (ref 3.5–5.1)
Sodium: 128 mEq/L — ABNORMAL LOW (ref 135–145)
TCO2: 6 mmol/L (ref 0–100)

## 2013-09-03 LAB — DIFFERENTIAL
Basophils Relative: 0 % (ref 0–1)
Eosinophils Absolute: 0 10*3/uL (ref 0.0–0.7)
Lymphs Abs: 1.6 10*3/uL (ref 0.7–4.0)
Monocytes Absolute: 1.4 10*3/uL — ABNORMAL HIGH (ref 0.1–1.0)
Neutro Abs: 17.2 10*3/uL — ABNORMAL HIGH (ref 1.7–7.7)
Neutrophils Relative %: 85 % — ABNORMAL HIGH (ref 43–77)

## 2013-09-03 LAB — PROTIME-INR
INR: 1.55 — ABNORMAL HIGH (ref 0.00–1.49)
Prothrombin Time: 18.2 seconds — ABNORMAL HIGH (ref 11.6–15.2)

## 2013-09-03 LAB — URINE MICROSCOPIC-ADD ON

## 2013-09-03 LAB — MAGNESIUM: Magnesium: 2.7 mg/dL — ABNORMAL HIGH (ref 1.5–2.5)

## 2013-09-03 LAB — TROPONIN I: Troponin I: <0.3 ng/mL (ref <0.30)

## 2013-09-03 LAB — PHOSPHORUS: Phosphorus: 10.2 mg/dL — ABNORMAL HIGH (ref 2.3–4.6)

## 2013-09-03 MED ORDER — DEXTROSE 50 % IV SOLN: 25.0000 mL | INTRAVENOUS | Status: DC | PRN

## 2013-09-03 MED ORDER — SODIUM CHLORIDE 0.9 % IV BOLUS (SEPSIS)
1000.0000 mL | Freq: Once | INTRAVENOUS | Status: AC
  Administered 2013-09-03: 1000 mL via INTRAVENOUS

## 2013-09-03 MED ORDER — SODIUM BICARBONATE 8.4 % IV SOLN
INTRAVENOUS | Status: AC
  Administered 2013-09-03: 50 meq
  Filled 2013-09-03: qty 50

## 2013-09-03 MED ORDER — FENTANYL CITRATE 0.05 MG/ML IJ SOLN
100.0000 ug | INTRAMUSCULAR | Status: DC | PRN
  Administered 2013-09-04: 100 ug via INTRAVENOUS
  Filled 2013-09-03: qty 2

## 2013-09-03 MED ORDER — SODIUM CHLORIDE 0.9 % IV SOLN
INTRAVENOUS | Status: DC
  Filled 2013-09-03: qty 1

## 2013-09-03 MED ORDER — MIDAZOLAM HCL 2 MG/2ML IJ SOLN
INTRAMUSCULAR | Status: AC
  Administered 2013-09-03: 2 mg
  Filled 2013-09-03: qty 4

## 2013-09-03 MED ORDER — SODIUM CHLORIDE 0.9 % IV SOLN
INTRAVENOUS | Status: DC
  Administered 2013-09-03: 14:00:00 via INTRAVENOUS

## 2013-09-03 MED ORDER — SUCCINYLCHOLINE CHLORIDE 20 MG/ML IJ SOLN
INTRAMUSCULAR | Status: AC
  Filled 2013-09-03: qty 1

## 2013-09-03 MED ORDER — SODIUM BICARBONATE 8.4 % IV SOLN
50.0000 meq | Freq: Once | INTRAVENOUS | Status: AC
  Administered 2013-09-03: 50 meq via INTRAVENOUS
  Filled 2013-09-03: qty 50

## 2013-09-03 MED ORDER — VANCOMYCIN HCL 10 G IV SOLR
1250.0000 mg | INTRAVENOUS | Status: DC
  Administered 2013-09-04 – 2013-09-05 (×2): 1250 mg via INTRAVENOUS
  Filled 2013-09-03 (×2): qty 1250

## 2013-09-03 MED ORDER — BIOTENE DRY MOUTH MT LIQD
15.0000 mL | Freq: Four times a day (QID) | OROMUCOSAL | Status: DC
  Administered 2013-09-04 (×4): 15 mL via OROMUCOSAL

## 2013-09-03 MED ORDER — SODIUM BICARBONATE 8.4 % IV SOLN
100.0000 meq | Freq: Once | INTRAVENOUS | Status: AC
  Administered 2013-09-03: 100 meq via INTRAVENOUS
  Filled 2013-09-03: qty 100

## 2013-09-03 MED ORDER — NOREPINEPHRINE BITARTRATE 1 MG/ML IJ SOLN: 2.0000 ug/min | Freq: Once | INTRAVENOUS | Status: DC

## 2013-09-03 MED ORDER — DEXTROSE-NACL 5-0.45 % IV SOLN
INTRAVENOUS | Status: DC
  Administered 2013-09-03: 21:00:00 via INTRAVENOUS

## 2013-09-03 MED ORDER — ETOMIDATE 2 MG/ML IV SOLN
INTRAVENOUS | Status: AC
  Administered 2013-09-03: 20 mg
  Filled 2013-09-03: qty 20

## 2013-09-03 MED ORDER — FENTANYL CITRATE 0.05 MG/ML IJ SOLN
100.0000 ug | INTRAMUSCULAR | Status: AC | PRN
  Administered 2013-09-03 (×3): 100 ug via INTRAVENOUS
  Filled 2013-09-03 (×3): qty 2

## 2013-09-03 MED ORDER — LIDOCAINE HCL (CARDIAC) 20 MG/ML IV SOLN
INTRAVENOUS | Status: AC
  Filled 2013-09-03: qty 5

## 2013-09-03 MED ORDER — PIPERACILLIN-TAZOBACTAM 3.375 G IVPB
3.3750 g | Freq: Three times a day (TID) | INTRAVENOUS | Status: DC
  Administered 2013-09-03 – 2013-09-05 (×5): 3.375 g via INTRAVENOUS
  Filled 2013-09-03 (×7): qty 50

## 2013-09-03 MED ORDER — SODIUM BICARBONATE 8.4 % IV SOLN
INTRAVENOUS | Status: DC
  Administered 2013-09-03 – 2013-09-04 (×2): via INTRAVENOUS
  Filled 2013-09-03 (×4): qty 150

## 2013-09-03 MED ORDER — VANCOMYCIN HCL IN DEXTROSE 1-5 GM/200ML-% IV SOLN
1000.0000 mg | Freq: Once | INTRAVENOUS | Status: AC
  Administered 2013-09-03: 1000 mg via INTRAVENOUS
  Filled 2013-09-03: qty 200

## 2013-09-03 MED ORDER — HYDROCORTISONE SOD SUCCINATE 100 MG IJ SOLR
50.0000 mg | Freq: Four times a day (QID) | INTRAMUSCULAR | Status: DC
  Administered 2013-09-03 – 2013-09-04 (×3): 50 mg via INTRAVENOUS
  Filled 2013-09-03 (×7): qty 1

## 2013-09-03 MED ORDER — PANTOPRAZOLE SODIUM 40 MG IV SOLR
40.0000 mg | INTRAVENOUS | Status: DC
  Administered 2013-09-03: 40 mg via INTRAVENOUS
  Filled 2013-09-03 (×3): qty 40

## 2013-09-03 MED ORDER — FENTANYL CITRATE 0.05 MG/ML IJ SOLN
INTRAMUSCULAR | Status: AC
  Administered 2013-09-03: 100 ug
  Filled 2013-09-03: qty 4

## 2013-09-03 MED ORDER — NOREPINEPHRINE BITARTRATE 1 MG/ML IJ SOLN
2.0000 ug/min | INTRAVENOUS | Status: DC
  Administered 2013-09-03: 4 ug/min via INTRAVENOUS
  Filled 2013-09-03 (×2): qty 4

## 2013-09-03 MED ORDER — ROCURONIUM BROMIDE 50 MG/5ML IV SOLN
INTRAVENOUS | Status: AC
  Administered 2013-09-03: 10 mg
  Filled 2013-09-03: qty 2

## 2013-09-03 MED ORDER — SODIUM BICARBONATE 8.4 % IV SOLN
100.0000 meq | Freq: Once | INTRAVENOUS | Status: AC
  Administered 2013-09-03: 100 meq via INTRAVENOUS

## 2013-09-03 MED ORDER — CHLORHEXIDINE GLUCONATE 0.12 % MT SOLN
15.0000 mL | Freq: Two times a day (BID) | OROMUCOSAL | Status: DC
  Administered 2013-09-03 – 2013-09-04 (×3): 15 mL via OROMUCOSAL
  Filled 2013-09-03 (×3): qty 15

## 2013-09-03 MED ORDER — PIPERACILLIN-TAZOBACTAM 3.375 G IVPB 30 MIN
3.3750 g | Freq: Once | INTRAVENOUS | Status: AC
  Administered 2013-09-03: 3.375 g via INTRAVENOUS
  Filled 2013-09-03: qty 50

## 2013-09-03 MED ORDER — SODIUM CHLORIDE 0.9 % IV SOLN
INTRAVENOUS | Status: DC
  Administered 2013-09-03 (×2): via INTRAVENOUS

## 2013-09-03 MED ORDER — SODIUM BICARBONATE 8.4 % IV SOLN
INTRAVENOUS | Status: AC
  Filled 2013-09-03: qty 100

## 2013-09-03 MED ORDER — SODIUM CHLORIDE 0.9 % IV SOLN
INTRAVENOUS | Status: AC
  Administered 2013-09-03: 13:00:00 via INTRAVENOUS

## 2013-09-03 MED ORDER — HEPARIN SODIUM (PORCINE) 5000 UNIT/ML IJ SOLN
5000.0000 [IU] | Freq: Three times a day (TID) | INTRAMUSCULAR | Status: DC
  Administered 2013-09-03 – 2013-09-07 (×12): 5000 [IU] via SUBCUTANEOUS
  Filled 2013-09-03 (×14): qty 1

## 2013-09-03 MED ORDER — INSULIN REGULAR HUMAN 100 UNIT/ML IJ SOLN
INTRAMUSCULAR | Status: DC
  Administered 2013-09-03: 22:00:00 via INTRAVENOUS
  Administered 2013-09-03: 10.8 [IU]/h via INTRAVENOUS
  Filled 2013-09-03 (×2): qty 1

## 2013-09-03 MED ORDER — DEXTROSE 5 % IV SOLN: 2.0000 g | Freq: Two times a day (BID) | INTRAVENOUS | Status: DC

## 2013-09-03 MED ORDER — SODIUM CHLORIDE 0.9 % IV SOLN
1000.0000 mL | INTRAVENOUS | Status: DC
  Administered 2013-09-03: 1000 mL via INTRAVENOUS

## 2013-09-03 MED ORDER — STERILE WATER FOR INJECTION IV SOLN
INTRAVENOUS | Status: DC
  Filled 2013-09-03 (×2): qty 850

## 2013-09-03 MED ORDER — SODIUM CHLORIDE 0.9 % IV SOLN
INTRAVENOUS | Status: DC
  Administered 2013-09-03: 20:00:00 via INTRAVENOUS

## 2013-09-03 NOTE — ED Notes (Signed)
Husband at bedside.  

## 2013-09-03 NOTE — Procedures (Signed)
Arterial Catheter Insertion Procedure Note Bridget Martinez [id number] 08-15-1951  Procedure: Insertion of Arterial Catheter  Indications: Blood pressure monitoring and Frequent blood sampling  Procedure Details Consent: Risks of procedure as well as the alternatives and risks of each were explained to the (patient/caregiver).  Consent for procedure obtained. Time Out: Verified patient identification, verified procedure, site/side was marked, verified correct patient position, special equipment/implants available, medications/allergies/relevent history reviewed, required imaging and test results available.  Performed  Maximum sterile technique was used including antiseptics, cap, gloves, gown, hand hygiene, mask and sheet. Skin prep: Chlorhexidine; local anesthetic administered 20 gauge catheter was inserted into right radial artery using the Seldinger technique.  Evaluation Blood flow good; BP tracing good. Complications: No apparent complications.   Richardson Landry Minor ACNP [facility] PCCM Pager [phone] till 3 pm If no answer page [phone] 09/03/2013, 2:06 PM  I was present, supervised and participated in the entire procedure.  Rush Farmer, M.D. [facility] Pulmonary/Critical Care Medicine. Pager: [phone]. After hours pager: [phone].

## 2013-09-03 NOTE — ED Notes (Signed)
NOTIFIED DR. WARD IN PERSON OF PATIENTS LAB RESULTS OF CG4 LACTIC ACID  =1.34mmoI/L , CHEM 8 + = K = 8.1 mmoI/L , TC02 = 6 mmoI/L , GLU > 700 mg/dl , BUN = 45 mg/dl , CREA = 1.7 mg/dl , Hb  = 11.9G/DL , LACTIC ACID I-STST CG 4 = 1.80mmoI/L ,@11 :20 AM ,09/03/2013.

## 2013-09-03 NOTE — Progress Notes (Signed)
ANTIBIOTIC CONSULT NOTE - INITIAL  Pharmacy Consult for vancomycin + zosyn Indication: rule out pneumonia  No Known Allergies  Patient Measurements: Height: 5\' 5"  (165.1 cm) Weight: 194 lb 3.6 oz (88.1 kg) IBW/kg (Calculated) : 57 Adjusted Body Weight:   Vital Signs: Temp: 91.3 F (32.9 C) (12/26 1410) Temp src: Rectal (12/26 1105) BP: 128/51 mmHg (12/26 1500) Pulse Rate: 94 (12/26 1500) Intake/Output from previous day:   Intake/Output from this shift: Total I/O In: -  Out: 1200 [Urine:1200]  Labs:  Recent Labs  09/03/13 1054 09/03/13 1103  WBC 20.2*  --   HGB 10.0* 11.9*  PLT 374  --   CREATININE 1.72* 1.70*   Estimated Creatinine Clearance: 37.1 ml/min (by C-G formula based on Cr of 1.7). No results found for this basename: VANCOTROUGH, VANCOPEAK, VANCORANDOM, GENTTROUGH, GENTPEAK, GENTRANDOM, TOBRATROUGH, TOBRAPEAK, TOBRARND, AMIKACINPEAK, AMIKACINTROU, AMIKACIN,  in the last 72 hours   Microbiology: No results found for this or any previous visit (from the past 720 hour(s)).  Medical History: Past Medical History  Diagnosis Date  . Diabetes mellitus without complication   . Hypertension     Medications:  Anti-infectives   Start     Dose/Rate Route Frequency Ordered Stop   09/04/13 1100  vancomycin (VANCOCIN) 1,250 mg in sodium chloride 0.9 % 250 mL IVPB     1,250 mg 166.7 mL/hr over 90 Minutes Intravenous Every 24 hours 09/03/13 1537     09/03/13 2200  piperacillin-tazobactam (ZOSYN) IVPB 3.375 g     3.375 g 12.5 mL/hr over 240 Minutes Intravenous Every 8 hours 09/03/13 1537     09/03/13 1315  piperacillin-tazobactam (ZOSYN) IVPB 3.375 g     3.375 g 100 mL/hr over 30 Minutes Intravenous  Once 09/03/13 1311     09/03/13 1115  vancomycin (VANCOCIN) IVPB 1000 mg/200 mL premix     1,000 mg 200 mL/hr over 60 Minutes Intravenous  Once 09/03/13 1106 09/03/13 1333   09/03/13 1115  ceFEPIme (MAXIPIME) 2 g in dextrose 5 % 50 mL IVPB  Status:  Discontinued      2 g 100 mL/hr over 30 Minutes Intravenous Every 12 hours 09/03/13 1106 09/03/13 1310     Assessment: 34 yof presented to the ED with respiratory failure and DKA. Initiated on empiric broad-spectrum antibiotics vancomycin and zosy. Pt is hypothermic and WBC is elevated at 20.2. Scr is elevated at 1.7. Received 1gm vanc in the ED.   Vanc 12/26>> Zosyn 12/26>>  Goal of Therapy:  Vancomycin trough level 15-20 mcg/ml  Plan:  1. Zosyn 3.375gm IV x 1 over 30 minutes then 3.375gm IV Q8H (4 hr inf) 2. Vancomycin 1250mg  IV Q24H 3. F/u renal fxn, C&S, clinical status and trough at [place], Rande Lawman 09/03/2013,3:37 PM

## 2013-09-03 NOTE — ED Notes (Signed)
Activated Code Stroke 

## 2013-09-03 NOTE — ED Notes (Signed)
Per EMS pt from home with c/o hypoglycemia x past few days. Husband states as low as 30's. This morning husband found pt altered mental status. CBG for EMS read high. Pt responds to verbal, but not talking. VSS. 20G LAC.

## 2013-09-03 NOTE — ED Notes (Signed)
Dentures given to family member.

## 2013-09-03 NOTE — ED Notes (Signed)
Called pharmacy to have levophed verified.

## 2013-09-03 NOTE — ED Provider Notes (Signed)
TIME SEEN: 10:12 AM  CHIEF COMPLAINT: Altered mental status  HPI: Patient is a 62 y.o. female with a history of type I insulin-dependent diabetes, hypertension, hyperlipidemia, coronary artery disease status post bypass on Plavix who presents emergency department with altered mental status. Patient was last seen normal by her husband this morning at 6:30 AM. He reports that last night patient had some intermittent hypoglycemia. Today she stated she was not feeling well and had some vomiting. He denies that she's had any recent fevers or cough or diarrhea. No recent head injury that he is aware of. No prior history of stroke or seizure.  ROS: Unable to obtain secondary to altered mental status  PAST MEDICAL HISTORY/PAST SURGICAL HISTORY:  Past Medical History  Diagnosis Date  . Diabetes mellitus without complication   . Hypertension     MEDICATIONS:  Prior to Admission medications   Not on File    ALLERGIES:  No Known Allergies  SOCIAL HISTORY:  History  Substance Use Topics  . Smoking status: Never Smoker   . Smokeless tobacco: Not on file  . Alcohol Use: No    FAMILY HISTORY: No family history on file.  EXAM: BP 86/60  Pulse 86  Resp 24  SpO2 100% CONSTITUTIONAL: Patient will move all 4 extremities spontaneously but does not open her eyes or answer questions. She will mid and squeeze her eyes tightly closed with sternal. Her GCS is 9. HEAD: Normocephalic EYES: Conjunctivae clear, PERRL ENT: normal nose; no rhinorrhea; moist mucous membranes; pharynx without lesions noted NECK: Supple, no meningismus, no LAD  CARD: RRR; S1 and S2 appreciated; no murmurs, no clicks, no rubs, no gallops RESP: Normal chest excursion without splinting; breath sounds clear and equal bilaterally; no wheezes, no rhonchi, no rales, tachypneic ABD/GI: Normal bowel sounds; non-distended; soft, non-tender, no rebound, no guarding BACK:  The back appears normal and is non-tender to palpation, there  is no CVA tenderness EXT: Normal ROM in all joints; non-tender to palpation; no edema; normal capillary refill; no cyanosis  SKIN: Normal color for age and race; warm NEURO: Moves all extremities spontaneously, grimaces and will say no when sternal bowel   MEDICAL DECISION MAKING: Patient here with hypotension, altered mental status , hyperglycemia. Son reports she had similar symptoms in the past with an episode of DKA. Suspected as the cause of her symptoms today. Given patient is on Plavix, will obtain head CT. Will give IV fluids for hyperglycemia and hypotension. Patient is critically ill. Discussed with patient's husband and son who report she is a full code. She is currently protecting her airway with a GCS of 9. Will hold on intubation but have discussed with family that she may need to be intubated for airway protection.  ED PROGRESS: Patient's labs show leukocytosis of 20. She is also hypothermic. Will start bair hugger and broad-spectrum antibiotics with vancomycin and cefepime. Her blood pressure is improving with fluid that she is still hypertensive. She is currently receiving her third and fourth liter of IV fluid.  11:46 AM  Pt blood cultures, urine and urine culture pending. Will place Foley catheter. Head CT shows no intracranial hemorrhage or infarct. Patient has a metabolic acidosis with a pH of 6.8. Lactate is normal. Discussed with Dr. Joya Gaskins with critical care who will see the patient in the emergency department. We'll start bicarbonate drip and give amp of bicarbonate currently.  12:11 PM  Pt is still hypotensive.  Critical care is at bedside and will intubate patient for  airway protection and pre-central line for vasopressors.    CRITICAL CARE Performed by: Nyra Jabs   Total critical care time: 45 minutes  Critical care time was exclusive of separately billable procedures and treating other patients.  Hypotension, DKA, metabolic acidosis, possible  sepsis.  Critical care was necessary to treat or prevent imminent or life-threatening deterioration.  Critical care was time spent personally by me on the following activities: development of treatment plan with patient and/or surrogate as well as nursing, discussions with consultants, evaluation of patient's response to treatment, examination of patient, obtaining history from patient or surrogate, ordering and performing treatments and interventions, ordering and review of laboratory studies, ordering and review of radiographic studies, pulse oximetry and re-evaluation of patient's condition.       EKG Interpretation    Date/Time:  Friday September 03 2013 10:08:38 EST Ventricular Rate:  86 PR Interval:  180 QRS Duration: 198 QT Interval:  477 QTC Calculation: 571 R Axis:   -70 Text Interpretation:  Age not entered, assumed to be  62 years old for purpose of ECG interpretation Sinus rhythm Left bundle branch block Confirmed by WARD  DO, KRISTEN [id number]) on 09/03/2013 11:48:18 AM             [place], DO 09/03/13 1211

## 2013-09-03 NOTE — ED Notes (Signed)
Warm blankets applied to patient.  

## 2013-09-03 NOTE — ED Notes (Signed)
Dr. Leonides Schanz cancelled Code Stroke

## 2013-09-03 NOTE — Procedures (Signed)
Central Venous Catheter Insertion Procedure Note Bridget Martinez [id number] 11/11/1950  Procedure: Insertion of Central Venous Catheter Indications: Assessment of intravascular volume, Drug and/or fluid administration and Frequent blood sampling  Procedure Details Consent: Risks of procedure as well as the alternatives and risks of each were explained to the (patient/caregiver).  Consent for procedure obtained. Time Out: Verified patient identification, verified procedure, site/side was marked, verified correct patient position, special equipment/implants available, medications/allergies/relevent history reviewed, required imaging and test results available.  Performed  Maximum sterile technique was used including antiseptics, cap, gloves, gown, hand hygiene, mask and sheet. Skin prep: Chlorhexidine; local anesthetic administered A antimicrobial bonded/coated triple lumen catheter was placed in the left internal jugular vein using the Seldinger technique. Ultrasound guidance used.yes Catheter placed to 20 cm. Blood aspirated via all 3 ports and then flushed x 3. Line sutured x 2 and dressing applied.  Evaluation Blood flow good Complications: No apparent complications Patient did tolerate procedure well. Chest X-ray ordered to verify placement.  CXR: normal.  Richardson Landry Minor ACNP [facility] PCCM Pager [phone] till 3 pm If no answer page [phone] 09/03/2013, 2:05 PM  U/S used in placement. I was present, supervised and participated in the entire procedure.  Rush Farmer, M.D. [facility] Pulmonary/Critical Care Medicine. Pager: [phone]. After hours pager: [phone].

## 2013-09-03 NOTE — H&P (Signed)
Name: Bridget Martinez MRN: [id number] DOB: 03/23/1951    ADMISSION DATE:  09/03/2013 CONSULTATION DATE:  09/03/2013  REFERRING MD :  EDP PRIMARY SERVICE: PCCM  CHIEF COMPLAINT:  DKA and respiratory failure  BRIEF PATIENT DESCRIPTION: 62 year old female with extensive PMH who did not take her insulin on the day PTP because she was not feeling well.  Patient reported she was not feeling well and started having some vomitting after which point she became lethargic and patient was brought to the ED.  In the ED she was noted to be hyperglycemic and was severely acidotic.  Shortly after arriving to the ED patient became obtunded and PCCM was called to admit.  SIGNIFICANT EVENTS / STUDIES:  12/26 admitted with DKA and intubated for being obtunded.  LINES / TUBES: ET tube placement 12/26>>> L IJ TLC 12/26>>>  CULTURES: Blood 12/26>>> Urine 12/26>>> Sputum 12/26>>>  ANTIBIOTICS: Vancomycin 12/26>>> Zosyn 12/26>>>  PAST MEDICAL HISTORY :  Past Medical History  Diagnosis Date  . Diabetes mellitus without complication   . Hypertension    No past surgical history on file. Prior to Admission medications   Medication Sig Start Date End Date Taking? Authorizing Provider  alendronate (FOSAMAX) 70 MG tablet Take 70 mg by mouth once a week. Take with a full glass of water on an empty stomach.   Yes Historical Provider, MD  aspirin EC 81 MG tablet Take 81 mg by mouth daily.   Yes Historical Provider, MD  carvedilol (COREG) 12.5 MG tablet Take 12.5 mg by mouth 2 (two) times daily with a meal.   Yes Historical Provider, MD  clopidogrel (PLAVIX) 75 MG tablet Take 75 mg by mouth daily with breakfast.   Yes Historical Provider, MD  enalapril (VASOTEC) 5 MG tablet Take 5 mg by mouth 2 (two) times daily.   Yes Historical Provider, MD  escitalopram (LEXAPRO) 10 MG tablet Take 10 mg by mouth daily.   Yes Historical Provider, MD  furosemide (LASIX) 20 MG tablet Take 40 mg by mouth daily.   Yes  Historical Provider, MD  insulin detemir (LEVEMIR) 100 UNIT/ML injection Inject 24-26 Units into the skin 2 (two) times daily. 24 Units in AM and 26 Units in PM   Yes Historical Provider, MD  insulin lispro (HUMALOG) 100 UNIT/ML injection Inject 5-8 Units into the skin 3 (three) times daily before meals. Per sliding scale   Yes Historical Provider, MD  levothyroxine (SYNTHROID, LEVOTHROID) 125 MCG tablet Take 125 mcg by mouth daily before breakfast.   Yes Historical Provider, MD  pantoprazole (PROTONIX) 40 MG tablet Take 40 mg by mouth daily.   Yes Historical Provider, MD  potassium chloride (K-DUR) 10 MEQ tablet Take 10 mEq by mouth daily.   Yes Historical Provider, MD  rosuvastatin (CRESTOR) 40 MG tablet Take 40 mg by mouth daily.   Yes Historical Provider, MD  spironolactone (ALDACTONE) 25 MG tablet Take 25 mg by mouth daily.   Yes Historical Provider, MD   No Known Allergies  FAMILY HISTORY:  No family history on file. SOCIAL HISTORY:  reports that she has never smoked. She does not have any smokeless tobacco history on file. She reports that she does not drink alcohol or use illicit drugs.  REVIEW OF SYSTEMS:  Unattainable, patient is obtunded.  SUBJECTIVE:   VITAL SIGNS: Temp:  [91.8 F (33.2 C)] 91.8 F (33.2 C) (12/26 1105) Pulse Rate:  [82-86] 82 (12/26 1107) Resp:  [18-24] 19 (12/26 1107) BP: (74-88)/(30-60) 88/36  mmHg (12/26 1107) SpO2:  [100 %] 100 % (12/26 1107) HEMODYNAMICS:   VENTILATOR SETTINGS:   INTAKE / OUTPUT: Intake/Output   None     PHYSICAL EXAMINATION: General:  Chronically ill appearing obese female, obtunded. Neuro:  Obtunded, withdraws ext to pain but not command. HEENT:  [place]/AT, PERRL, EOM-I and DMM. Cardiovascular:  RRR, Nl S1/S2, -M/R/G. Lungs:  CTA bilaterally. Abdomen:  Soft, NT, ND and +BS. Musculoskeletal:  -edema and -tenderness. Skin:  Intact.  LABS:  CBC  Recent Labs Lab 09/03/13 1054 09/03/13 1103  WBC 20.2*  --   HGB 10.0*  11.9*  HCT 34.4* 35.0*  PLT 374  --    Coag's  Recent Labs Lab 09/03/13 1054  APTT 23*  INR 1.55*   BMET  Recent Labs Lab 09/03/13 1054 09/03/13 1103  NA 125* 128*  K >7.5* 8.1*  CL 86* 105  CO2 <7*  --   BUN 47* 45*  CREATININE 1.72* 1.70*  GLUCOSE 1011* >700*   Electrolytes  Recent Labs Lab 09/03/13 1054  CALCIUM 8.3*   Sepsis Markers  Recent Labs Lab 09/03/13 1103  LATICACIDVEN 1.39   ABG No results found for this basename: PHART, PCO2ART, PO2ART,  in the last 168 hours Liver Enzymes  Recent Labs Lab 09/03/13 1054  AST 12  ALT 10  ALKPHOS 189*  BILITOT 0.1*  ALBUMIN 2.9*   Cardiac Enzymes  Recent Labs Lab 09/03/13 1054  TROPONINI <0.30   Glucose  Recent Labs Lab 09/03/13 1008  GLUCAP >600*    Imaging Ct Head (brain) Wo Contrast  09/03/2013   CLINICAL DATA:  Altered mental status and nonresponsive.  EXAM: CT HEAD WITHOUT CONTRAST  TECHNIQUE: Contiguous axial images were obtained from the base of the skull through the vertex without intravenous contrast.  COMPARISON:  None.  FINDINGS: The brain demonstrates no evidence of hemorrhage, infarction, edema, mass effect, extra-axial fluid collection, hydrocephalus or mass lesion. The skull is unremarkable. Calcified protuberances along the inner table of the left frontal skull may represent a focal calcified meningioma. No mass effect or adjacent edema is seen.  IMPRESSION: No acute findings.   Electronically Signed   By: Aletta Edouard M.D.   On: 09/03/2013 10:52     CXR: Pending  ASSESSMENT / PLAN:  PULMONARY A: VDRF due to inability to protect her airway with severe acidosis. P:   - Intubate. - Hyperventilate. - CXR and ABG now and in AM. - Adjust vent accordingly.  CARDIOVASCULAR A: Hypotensive, likely related to acidosis. P:  - Place TLC. - Check CVP. - Levophed for BP support. - Address acidosis. - Stress dose steroids. - Cortisol level.  RENAL A:  ARF, metabolic  acidosis and hyperkalemia. P:   - Bicarb drip. - Aggressive hydration. - BMET at 3 PM today.  GASTROINTESTINAL A:  No active issues. P:   - NPO.  HEMATOLOGIC A:  Leukocytosis, likely stress related. P:  - Follow CBC.  INFECTIOUS A:  Unknown etiology. P:   - Pan culture. - Vanc/zosyn.  ENDOCRINE A:  DKA P:   - DKA protocol. - Stress dose steroids.  NEUROLOGIC A: Lethargic most likely metabolic in nature  P:   - CT head if no improvement after DKA treatment. - Hold sedation for now.  TODAY'S SUMMARY:   62 yo WF with IDDM who presents with 48 hours of feeling bad. Did not take or check her insulin 12/25 and presents 12-26 with glucose >700, ph <7 and lethargy. She is unable  to protect her airway and will be intubated for safety. Poor IV access therefore CVL will be placed. She has an extensive cardiac hx that includes CABG x 4 2 years ago. PCCM will admit to ICU and aggressively treat her  DKA.   CC time 35 min.  Rush Farmer, M.D. [facility] Pulmonary/Critical Care Medicine. Pager: [phone]. After hours pager: [phone].

## 2013-09-03 NOTE — Progress Notes (Signed)
  Echocardiogram 2D Echocardiogram has been performed.  Mauricio Po 09/03/2013, 2:06 PM

## 2013-09-03 NOTE — Procedures (Signed)
Intubation Procedure Note Madinah Dionicio [id number] 06-21-1951  Procedure: Intubation Indications: Respiratory insufficiency  Procedure Details Consent: Risks of procedure as well as the alternatives and risks of each were explained to the (patient/caregiver).  Consent for procedure obtained. Time Out: Verified patient identification, verified procedure, site/side was marked, verified correct patient position, special equipment/implants available, medications/allergies/relevent history reviewed, required imaging and test results available.  Performed  Sabra Heck and 4 Medications:  Fentanyl  Etomidate Versed NMB    Evaluation Hemodynamic Status: Transient hypotension treated with fluid; O2 sats: stable throughout Patient's Current Condition: stable Complications: No apparent complications Patient did tolerate procedure well. Chest X-ray ordered to verify placement.  CXR: tube position acceptable.   Richardson Landry Minor ACNP Maryanna Shape PCCM Pager [phone] till 3 pm If no answer page [phone] 09/03/2013, 2:03 PM  I was present, supervised and participated in the entire procedure.  Rush Farmer, M.D. [facility] Pulmonary/Critical Care Medicine. Pager: [phone]. After hours pager: [phone].

## 2013-09-04 ENCOUNTER — Encounter (HOSPITAL_COMMUNITY): Payer: Self-pay | Admitting: *Deleted

## 2013-09-04 ENCOUNTER — Inpatient Hospital Stay (HOSPITAL_COMMUNITY): Payer: BC Managed Care – PPO

## 2013-09-04 DIAGNOSIS — J9600 Acute respiratory failure, unspecified whether with hypoxia or hypercapnia: Secondary | ICD-10-CM

## 2013-09-04 DIAGNOSIS — E1110 Type 2 diabetes mellitus with ketoacidosis without coma: Secondary | ICD-10-CM

## 2013-09-04 LAB — BASIC METABOLIC PANEL
BUN: 24 mg/dL — ABNORMAL HIGH (ref 6–23)
CO2: 20 mEq/L (ref 19–32)
CO2: 24 mEq/L (ref 19–32)
CO2: 26 mEq/L (ref 19–32)
Calcium: 7 mg/dL — ABNORMAL LOW (ref 8.4–10.5)
Calcium: 7 mg/dL — ABNORMAL LOW (ref 8.4–10.5)
Calcium: 6.5 mg/dL — ABNORMAL LOW (ref 8.4–10.5)
Creatinine, Ser: 0.99 mg/dL (ref 0.50–1.10)
Creatinine, Ser: 0.93 mg/dL (ref 0.50–1.10)
GFR calc Af Amer: 69 mL/min — ABNORMAL LOW (ref >90)
GFR calc Af Amer: 72 mL/min — ABNORMAL LOW (ref >90)
GFR calc non Af Amer: 62 mL/min — ABNORMAL LOW (ref >90)
Glucose, Bld: 162 mg/dL — ABNORMAL HIGH (ref 70–99)
Glucose, Bld: 186 mg/dL — ABNORMAL HIGH (ref 70–99)
Potassium: 2.8 mEq/L — ABNORMAL LOW (ref 3.5–5.1)
Potassium: 3 mEq/L — ABNORMAL LOW (ref 3.5–5.1)
Sodium: 146 mEq/L — ABNORMAL HIGH (ref 135–145)
Sodium: 146 mEq/L — ABNORMAL HIGH (ref 135–145)

## 2013-09-04 LAB — URINE CULTURE: Colony Count: >=100000

## 2013-09-04 LAB — CLOSTRIDIUM DIFFICILE BY PCR: Toxigenic C. Difficile by PCR: NEGATIVE

## 2013-09-04 LAB — GLUCOSE, CAPILLARY
Glucose-Capillary: 168 mg/dL — ABNORMAL HIGH (ref 70–99)
Glucose-Capillary: 200 mg/dL — ABNORMAL HIGH (ref 70–99)
Glucose-Capillary: 122 mg/dL — ABNORMAL HIGH (ref 70–99)
Glucose-Capillary: 161 mg/dL — ABNORMAL HIGH (ref 70–99)
Glucose-Capillary: 157 mg/dL — ABNORMAL HIGH (ref 70–99)
Glucose-Capillary: 168 mg/dL — ABNORMAL HIGH (ref 70–99)
Glucose-Capillary: 170 mg/dL — ABNORMAL HIGH (ref 70–99)
Glucose-Capillary: 151 mg/dL — ABNORMAL HIGH (ref 70–99)
Glucose-Capillary: 135 mg/dL — ABNORMAL HIGH (ref 70–99)
Glucose-Capillary: 163 mg/dL — ABNORMAL HIGH (ref 70–99)
Glucose-Capillary: 119 mg/dL — ABNORMAL HIGH (ref 70–99)
Glucose-Capillary: 185 mg/dL — ABNORMAL HIGH (ref 70–99)

## 2013-09-04 LAB — CBC
HCT: 25.5 % — ABNORMAL LOW (ref 36.0–46.0)
MCH: 28.9 pg (ref 26.0–34.0)
MCV: 84.7 fL (ref 78.0–100.0)
Platelets: 242 10*3/uL (ref 150–400)
RBC: 3.01 MIL/uL — ABNORMAL LOW (ref 3.87–5.11)

## 2013-09-04 LAB — POCT I-STAT 3, ART BLOOD GAS (G3+)
Acid-Base Excess: 2 mmol/L (ref 0.0–2.0)
O2 Saturation: 100 %
Patient temperature: 100.1
pO2, Arterial: 171 mmHg — ABNORMAL HIGH (ref 80.0–100.0)

## 2013-09-04 LAB — TROPONIN I
Troponin I: 3.1 ng/mL (ref <0.30)
Troponin I: 1.54 ng/mL (ref <0.30)

## 2013-09-04 LAB — CORTISOL: Cortisol, Plasma: 30.8 ug/dL

## 2013-09-04 LAB — MAGNESIUM: Magnesium: 1.6 mg/dL (ref 1.5–2.5)

## 2013-09-04 LAB — KETONES, QUALITATIVE

## 2013-09-04 MED ORDER — POTASSIUM & SODIUM PHOSPHATES 280-160-250 MG PO PACK
4.0000 | PACK | Freq: Three times a day (TID) | ORAL | Status: AC
  Administered 2013-09-04 (×3): 4
  Filled 2013-09-04 (×5): qty 4

## 2013-09-04 MED ORDER — MAGNESIUM SULFATE 40 MG/ML IJ SOLN
2.0000 g | Freq: Once | INTRAMUSCULAR | Status: AC
  Administered 2013-09-04: 2 g via INTRAVENOUS
  Filled 2013-09-04: qty 50

## 2013-09-04 MED ORDER — POTASSIUM PHOSPHATE DIBASIC 3 MMOLE/ML IV SOLN
40.0000 meq | Freq: Once | INTRAVENOUS | Status: AC
  Administered 2013-09-04: 40 meq via INTRAVENOUS
  Filled 2013-09-04: qty 9.09

## 2013-09-04 MED ORDER — DEXTROSE 5 % IV SOLN
INTRAVENOUS | Status: DC
  Administered 2013-09-04: 11:00:00 via INTRAVENOUS

## 2013-09-04 MED ORDER — INSULIN GLARGINE 100 UNIT/ML ~~LOC~~ SOLN
10.0000 [IU] | SUBCUTANEOUS | Status: AC
  Administered 2013-09-04: 10 [IU] via SUBCUTANEOUS
  Filled 2013-09-04: qty 0.1

## 2013-09-04 MED ORDER — K PHOS MONO-SOD PHOS DI & MONO 155-852-130 MG PO TABS
1000.0000 mg | ORAL_TABLET | Freq: Three times a day (TID) | ORAL | Status: DC
Start: 2013-09-04 — End: 2013-09-04
  Filled 2013-09-04 (×3): qty 4

## 2013-09-04 MED ORDER — POTASSIUM CHLORIDE 10 MEQ/50ML IV SOLN
10.0000 meq | INTRAVENOUS | Status: AC
  Administered 2013-09-04 (×4): 10 meq via INTRAVENOUS
  Filled 2013-09-04 (×4): qty 50

## 2013-09-04 MED ORDER — INSULIN ASPART 100 UNIT/ML ~~LOC~~ SOLN
2.0000 [IU] | SUBCUTANEOUS | Status: DC
  Administered 2013-09-04 (×2): 4 [IU] via SUBCUTANEOUS
  Administered 2013-09-04: 2 [IU] via SUBCUTANEOUS
  Administered 2013-09-05: 6 [IU] via SUBCUTANEOUS
  Administered 2013-09-05: 2 [IU] via SUBCUTANEOUS

## 2013-09-04 MED ORDER — PANTOPRAZOLE SODIUM 40 MG PO TBEC
40.0000 mg | DELAYED_RELEASE_TABLET | ORAL | Status: DC
  Administered 2013-09-04 – 2013-09-06 (×3): 40 mg via ORAL
  Filled 2013-09-04 (×3): qty 1

## 2013-09-04 MED ORDER — INSULIN GLARGINE 100 UNIT/ML ~~LOC~~ SOLN
10.0000 [IU] | Freq: Every day | SUBCUTANEOUS | Status: DC
  Filled 2013-09-04: qty 0.1

## 2013-09-04 MED ORDER — POTASSIUM PHOSPHATE MONOBASIC 500 MG PO TABS: 1000.0000 mg | ORAL_TABLET | Freq: Three times a day (TID) | ORAL | Status: DC

## 2013-09-04 MED ORDER — ASPIRIN 325 MG PO TABS
325.0000 mg | ORAL_TABLET | Freq: Every day | ORAL | Status: DC
  Administered 2013-09-04 – 2013-09-05 (×2): 325 mg via ORAL
  Filled 2013-09-04 (×3): qty 1

## 2013-09-04 NOTE — Progress Notes (Signed)
INITIAL NUTRITION ASSESSMENT  DOCUMENTATION CODES Per approved criteria  -Obesity Unspecified   INTERVENTION: If pt remains intubated recommend initiating TF: Initiate Vital High Protein @ 20 ml/hr via OGT and increase by 10 ml every 4 hours to goal rate of 45 ml/hr. 30 ml Prostat once daily.  At goal rate, tube feeding regimen will provide 1180 kcal, 110 grams of protein, and 903 ml of H2O.    NUTRITION DIAGNOSIS: Inadequate oral intake related to inability to eat as evidenced by NPO status.   Goal: Enteral nutrition to provide 60-70% of estimated calorie needs (22-25 kcals/kg ideal body weight) and 100% of estimated protein needs, based on ASPEN guidelines for permissive underfeeding in critically ill obese individuals.   Monitor:  Vent status TF initiation or diet advancement Weight trends Labs  Reason for Assessment: New Vent  62 y.o. female  Admitting Dx: <principal problem not specified>  ASSESSMENT: 62 yo with DM not taking insulin x 1 day admitted with DKA and requiring mechanical ventilation for severe acidosis.nded and PCCM was called to admit. Pt intubated and resting at time of visit. Per MD note pt improved overnight, wean to extubated, expected to extubate today.   Height: Ht Readings from Last 1 Encounters:  09/03/13 5\' 5"  (1.651 m)    Weight: Wt Readings from Last 1 Encounters:  09/04/13 197 lb 12 oz (89.7 kg)    Ideal Body Weight: 125 lbs  % Ideal Body Weight: 158%  Wt Readings from Last 10 Encounters:  09/04/13 197 lb 12 oz (89.7 kg)    Usual Body Weight: unknown  % Usual Body Weight: NA  BMI:  Body mass index is 32.91 kg/(m^2). Patient is currently intubated on ventilator support.  MV: 7 L/min Temp (24hrs), Avg:97.5 F (36.4 C), Min:91.3 F (32.9 C), Max:100.1 F (37.8 C)  Propofol: none  Estimated Nutritional Needs: Kcal: 1716 (Goal 1030-1200) Protein: >/= 114 grams Fluid: 2.6-2.7 L/day  Skin: +1 Generalized edema, +1 RLE and  LLE edema; skin tear on buttocks  Diet Order: NPO  EDUCATION NEEDS: -No education needs identified at this time   Intake/Output Summary (Last 24 hours) at 09/04/13 1141 Last data filed at 09/04/13 1009  Gross per 24 hour  Intake 4542.35 ml  Output   3525 ml  Net 1017.35 ml    Last BM: 12/27 diarrhea  Labs:   Recent Labs Lab 09/03/13 1103 09/03/13 1207  09/03/13 1700 09/04/13 0300 09/04/13 0930  NA 128*  --   < > 145 149* 146*  K 8.1*  --   < > 3.6 2.8* 3.0*  CL 105  --   < > 105 109 106  CO2  --   --   < > 8* 20 24  BUN 45*  --   < > 36* 24* 20  CREATININE 1.70*  --   < > 1.32* 0.99 0.95  CALCIUM  --   --   < > 6.8* 7.0* 7.0*  MG  --  2.7*  --   --  1.6  --   PHOS  --  10.2*  --   --  0.7*  --   GLUCOSE >700*  --   < > 408* 162* 186*  < > = values in this interval not displayed.  CBG (last 3)   Recent Labs  09/04/13 0558 09/04/13 0706 09/04/13 0811  GLUCAP 157* 122* 168*    Scheduled Meds: . antiseptic oral rinse  15 mL Mouth Rinse QID  . aspirin  325 mg Oral Daily  . chlorhexidine  15 mL Mouth Rinse BID  . heparin  5,000 Units Subcutaneous Q8H  . magnesium sulfate 1 - 4 g bolus IVPB  2 g Intravenous Once  . pantoprazole (PROTONIX) IV  40 mg Intravenous Q24H  . piperacillin-tazobactam (ZOSYN)  IV  3.375 g Intravenous Q8H  . potassium & sodium phosphates  4 packet Per Tube TID WC & HS  . vancomycin  1,250 mg Intravenous Q24H    Continuous Infusions: . sodium chloride 100 mL/hr at 09/03/13 1328  . dextrose 70 mL/hr at 09/04/13 1043  . insulin (NOVOLIN-R) infusion 2.6 Units/hr (09/04/13 1136)    Past Medical History  Diagnosis Date  . Diabetes mellitus without complication   . Hypertension     No past surgical history on file.  Pryor Ochoa RD, LDN Inpatient Clinical Dietitian Pager: [phone] After Hours Pager: [phone]

## 2013-09-04 NOTE — Procedures (Signed)
Extubation Procedure Note  Patient Details:   Name: Micelle Gessler DOB: 06/15/1951 MRN: [id number]   Airway Documentation:     Evaluation  O2 sats: stable throughout Complications: No apparent complications Patient did tolerate procedure well. Bilateral Breath Sounds: Clear;Diminished Suctioning: Airway Yes, pt. able to lift head off bed on own, has positive cuff leak, NIF(-20), FVC(800cc), placed on 2 lpm n/c prior to extubation, no Stridor/distress noted s/p extubation, RN @ bedside, RT to monitor, I/S placed in room, plan to instruct asap Winferd Humphrey 09/04/2013, 11:41 AM

## 2013-09-04 NOTE — H&P (Signed)
PULMONARY / CRITICAL CARE MEDICINE  Name: Bridget Martinez MRN: [id number] DOB: 12-16-1950    ADMISSION DATE:  09-20-13 CONSULTATION DATE:  2013/09/20  REFERRING MD :  EDP PRIMARY SERVICE: PCCM  CHIEF COMPLAINT:  DKA and respiratory failure  BRIEF PATIENT DESCRIPTION: 62 yo with DM not taking insulin x 1 day admitted with DKA and requiring mechanical ventilation for severe acidosis.nded and PCCM was called to admit.  SIGNIFICANT EVENTS / STUDIES:  Sep 21, 2023  Head CT >>> nad September 21, 2023  TTE >>> EF 40-45%, RWMA cannot be excluded, grade 2 diastolic dysfunction  LINES / TUBES: OETT 09-21-23 >>> OGT 2023/09/21 >>> L IJ TLC 2023/09/21 >>>  CULTURES: Sep 21, 2023  Blood >>> Sep 21, 2023 Urine >>> 2023-09-21  Sputum >>>  ANTIBIOTICS: Vancomycin 09-21-23 >>> Zosyn 2023/09/21 >>>  INTERVAL HISTORY: Improved overnight. Diarrhea. Off pressors.  VITAL SIGNS: Temp:  [91.3 F (32.9 C)-100.1 F (37.8 C)] 99.6 F (37.6 C) (12/27 0736) Pulse Rate:  [72-94] 76 (12/27 0800) Resp:  [14-35] 14 (12/27 0800) BP: (72-161)/(25-60) 106/34 mmHg (12/27 0800) SpO2:  [82 %-100 %] 99 % (12/27 0800) Arterial Line BP: (106-185)/(29-64) 106/29 mmHg (12/27 0800) FiO2 (%):  [30 %-100 %] 30 % (12/27 0449) Weight:  [88.1 kg (194 lb 3.6 oz)-89.7 kg (197 lb 12 oz)] 89.7 kg (197 lb 12 oz) (12/27 0300)  HEMODYNAMICS: CVP:  [7 mmHg-10 mmHg] 10 mmHg  VENTILATOR SETTINGS: Vent Mode:  [-] PRVC FiO2 (%):  [30 %-100 %] 30 % Set Rate:  [14 bmp-30 bmp] 14 bmp Vt Set:  [460 mL-600 mL] 600 mL PEEP:  [5 cmH20] 5 cmH20 Plateau Pressure:  [14 cmH20-19 cmH20] 19 cmH20  INTAKE / OUTPUT: Intake/Output     21-Sep-2023 0701 - 12/27 0700 12/27 0701 - 12/28 0700   I.V. (mL/kg) 3843.3 (42.8)    NG/GT 90    IV Piggyback 609.1    Total Intake(mL/kg) 4542.4 (50.6)    Urine (mL/kg/hr) 3290 170 (0.8)   Total Output 3290 170   Net +1252.4 -170         PHYSICAL EXAMINATION: General:  Tolerating SBT without distress Neuro:  Awake, alert, appropriate  HEENT:   PERRL Cardiovascular:  RRR, no murmurs Lungs:  CTAB Abdomen:  Diffuse generalized tenderness, no rebound Musculoskeletal:  Trace edema Skin:  Intact.  LABS:  CBC  Recent Labs Lab 2013-09-20 1540 09-20-2013 1727 09/04/13 0300  WBC 19.8* 16.9* 12.3*  HGB 9.3* 9.0* 8.7*  HCT 30.0* 28.0* 25.5*  PLT 337 314 242   Coag's  Recent Labs Lab Sep 20, 2013 1054  APTT 23*  INR 1.55*   BMET  Recent Labs Lab 09-20-2013 1540 September 20, 2013 1700 09/04/13 0300  NA 142 145 149*  K 4.1 3.6 2.8*  CL 105 105 109  CO2 <7* 8* 20  BUN 38* 36* 24*  CREATININE 1.32* 1.32* 0.99  GLUCOSE 504* 408* 162*   Electrolytes  Recent Labs Lab September 20, 2013 1054 09/20/13 1207 09/20/13 1540 Sep 20, 2013 1700 09/04/13 0300  CALCIUM 8.3*  --  6.9* 6.8* 7.0*  MG  --  2.7*  --   --  1.6  PHOS  --  10.2*  --   --  0.7*   Sepsis Markers  Recent Labs Lab 09-20-13 1103  LATICACIDVEN 1.39   ABG  Recent Labs Lab 09/20/13 1559 09-20-13 1850 09/04/13 0444  PHART 6.938* 7.274* 7.560*  PCO2ART 25.6* 18.9* 26.1*  PO2ART 164.0* 182.0* 171.0*   Liver Enzymes  Recent Labs Lab 20-Sep-2013 1054  AST 12  ALT 10  ALKPHOS 189*  BILITOT 0.1*  ALBUMIN 2.9*   Cardiac Enzymes  Recent Labs Lab 09/03/13 1054  TROPONINI <0.30   Glucose  Recent Labs Lab 09/04/13 0302 09/04/13 0354 09/04/13 0506 09/04/13 0558 09/04/13 0706 09/04/13 0811  GLUCAP 154* 161* 157* 157* 122* 168*   CXR: 12/27  Low ETT, rest of the hardware in good position, no overt airspace disease  ASSESSMENT / PLAN:  PULMONARY A: Acute respiratory failure in setting of acute encephalopathy and severe acidosis. Underlying conditions resolving. P:   Goal SpO2>92 Wean to extubate  CARDIOVASCULAR A: SIRS / sepsis - resolving. Lactate reassuring. 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 P:  Goal MAP>65 D/c Levophed Recheck troponin Add ASA Considering BB / ACEI if hemodynamically stable  RENAL A:  AKI - resolving.  Hypokalemia. Hypophosphatemia. P:   Fluids per DKA protocol D/c Bicarbomate Electrolytes replaced by eMD Trend BMP q4h while on DKA protool  GASTROINTESTINAL A:  Diarrhea. Viral gastroenteritis? P:   C.dif PCR  HEMATOLOGIC A:  Mild coagulopathy. Anemia. P:  Trend CBC / INR  INFECTIOUS A:  No overt infection. P:   Cultures / antibiotics as above  ENDOCRINE A:  DKA. DM. Adrenal insufficiency ruled out ( cortisol 30 ) P:   DKA protocol. D/c Hydrocortisone  NEUROLOGIC A: Acute encephalopathy, improving. P:   D/c sedation as expect toextubate  I have personally obtained history, examined patient, evaluated and interpreted laboratory and imaging results, reviewed medical records, formulated assessment / plan and placed orders.  CRITICAL CARE:  The patient is critically ill with multiple organ systems failure and requires high complexity decision making for assessment and support, frequent evaluation and titration of therapies, application of advanced monitoring technologies and extensive interpretation of multiple databases. Critical Care Time devoted to patient care services described in this note is 35 minutes.   Doree Fudge, MD Pulmonary and [place] Pager: [phone]  09/04/2013, 9:55 AM

## 2013-09-04 NOTE — Progress Notes (Signed)
CRITICAL VALUE ALERT  Critical value received:  Positive blood cultures, aerobic bottle from 09/03/13;  Gram + cocci in clusters  Date of notification:  09/04/13   Time of notification:  0753  Critical value read back:yes  Nurse who received alert:  Rise Paganini, RN  MD notified (1st page):  Dr. Earnest Conroy  Time of first page:  Notified in person at 0754  MD notified (2nd page):  Time of second page:  Responding MD:  Dr. Earnest Conroy    Time MD responded: [id number], no new orders, patient has active order for scheduled Vancomycin and Zosyn

## 2013-09-04 NOTE — Progress Notes (Signed)
CRITICAL VALUE ALERT  Critical value received:  Troponin 3.1   Date of notification:  09/04/13  Time of notification:  [id number]  Critical value read back:yes  Nurse who received alert:  Pam Drown, RN  MD notified (1st page):  Dr. Earnest Conroy   Time of first page:  8  MD notified (2nd page):  Time of second page:  Responding MD:  Dr. Earnest Conroy, Stat EKG ordered along with Q6 X3 Troponin  Time MD responded:  [id number]

## 2013-09-04 NOTE — Progress Notes (Addendum)
eLink Physician-Brief Progress Note Patient Name: Bridget Martinez DOB: [birth year] MRN: [id number]  Date of Service  09/04/2013   HPI/Events of Note   resp alkalosis  eICU Interventions  Hypokalemia /hypophos -repleted Lower RR 14 Dc bicarb gtt    Intervention Category Intermediate Interventions: Electrolyte abnormality - evaluation and management  ALVA,RAKESH V. 09/04/2013, 4:43 AM

## 2013-09-04 NOTE — Progress Notes (Signed)
[facility] Progress Note Patient Name: Bridget Martinez DOB: April 24, 1951 MRN: [id number]  Date of Service  09/04/2013   HPI/Events of Note   hypoK and gap closed.  eICU Interventions  KCl 40 meq IV, ICU hyperglycemia protocol and lantus 10 units x1.      Thelton Graca 09/04/2013, 3:33 PM

## 2013-09-04 NOTE — Progress Notes (Signed)
CRITICAL VALUE ALERT  Critical value received:  Phos 0.7  Date of notification:  09/04/2013  Time of notification:  04:10  Critical value read back:yes  Nurse who received alert:  BShepherd, RN  MD notified (1st page):  Elsworth Soho  Time of first page:  04:39  MD notified (2nd page):  Time of second page:  Responding MD:  Elsworth Soho  Time MD responded:  04:41

## 2013-09-05 DIAGNOSIS — R4182 Altered mental status, unspecified: Secondary | ICD-10-CM

## 2013-09-05 DIAGNOSIS — R579 Shock, unspecified: Secondary | ICD-10-CM

## 2013-09-05 LAB — GLUCOSE, CAPILLARY
Glucose-Capillary: 145 mg/dL — ABNORMAL HIGH (ref 70–99)
Glucose-Capillary: 142 mg/dL — ABNORMAL HIGH (ref 70–99)
Glucose-Capillary: 220 mg/dL — ABNORMAL HIGH (ref 70–99)
Glucose-Capillary: 275 mg/dL — ABNORMAL HIGH (ref 70–99)

## 2013-09-05 LAB — BASIC METABOLIC PANEL
BUN: 14 mg/dL (ref 6–23)
CO2: 29 mEq/L (ref 19–32)
Calcium: 6.8 mg/dL — ABNORMAL LOW (ref 8.4–10.5)
Chloride: 106 mEq/L (ref 96–112)
Creatinine, Ser: 0.91 mg/dL (ref 0.50–1.10)
GFR calc non Af Amer: 66 mL/min — ABNORMAL LOW (ref >90)
Glucose, Bld: 150 mg/dL — ABNORMAL HIGH (ref 70–99)
Potassium: 3 mEq/L — ABNORMAL LOW (ref 3.5–5.1)
Sodium: 143 mEq/L (ref 135–145)

## 2013-09-05 LAB — CBC
HCT: 24.5 % — ABNORMAL LOW (ref 36.0–46.0)
Hemoglobin: 8.6 g/dL — ABNORMAL LOW (ref 12.0–15.0)
MCH: 29.5 pg (ref 26.0–34.0)
MCHC: 35.1 g/dL (ref 30.0–36.0)
MCV: 83.9 fL (ref 78.0–100.0)
Platelets: 220 10*3/uL (ref 150–400)
RBC: 2.92 MIL/uL — ABNORMAL LOW (ref 3.87–5.11)

## 2013-09-05 LAB — CULTURE, BLOOD (ROUTINE X 2)

## 2013-09-05 LAB — TROPONIN I: Troponin I: 1.01 ng/mL (ref <0.30)

## 2013-09-05 MED ORDER — LEVOTHYROXINE SODIUM 125 MCG PO TABS
125.0000 ug | ORAL_TABLET | Freq: Every day | ORAL | Status: DC
  Administered 2013-09-06 – 2013-09-07 (×2): 125 ug via ORAL
  Filled 2013-09-05 (×4): qty 1

## 2013-09-05 MED ORDER — ESCITALOPRAM OXALATE 10 MG PO TABS
10.0000 mg | ORAL_TABLET | Freq: Every day | ORAL | Status: DC
  Administered 2013-09-05 – 2013-09-07 (×3): 10 mg via ORAL
  Filled 2013-09-05 (×3): qty 1

## 2013-09-05 MED ORDER — ASPIRIN EC 81 MG PO TBEC
81.0000 mg | DELAYED_RELEASE_TABLET | Freq: Every day | ORAL | Status: DC
  Administered 2013-09-06 – 2013-09-07 (×2): 81 mg via ORAL
  Filled 2013-09-05 (×2): qty 1

## 2013-09-05 MED ORDER — PANTOPRAZOLE SODIUM 40 MG PO TBEC: 40.0000 mg | DELAYED_RELEASE_TABLET | Freq: Every day | ORAL | Status: DC

## 2013-09-05 MED ORDER — INSULIN GLARGINE 100 UNIT/ML ~~LOC~~ SOLN
15.0000 [IU] | Freq: Every day | SUBCUTANEOUS | Status: DC
  Administered 2013-09-05 – 2013-09-06 (×2): 15 [IU] via SUBCUTANEOUS
  Filled 2013-09-05 (×3): qty 0.15

## 2013-09-05 MED ORDER — ENALAPRIL MALEATE 5 MG PO TABS
5.0000 mg | ORAL_TABLET | Freq: Two times a day (BID) | ORAL | Status: DC
  Administered 2013-09-05 – 2013-09-07 (×5): 5 mg via ORAL
  Filled 2013-09-05 (×7): qty 1

## 2013-09-05 MED ORDER — CLOPIDOGREL BISULFATE 75 MG PO TABS
75.0000 mg | ORAL_TABLET | Freq: Every day | ORAL | Status: DC
  Administered 2013-09-06 – 2013-09-07 (×2): 75 mg via ORAL
  Filled 2013-09-05 (×4): qty 1

## 2013-09-05 MED ORDER — DEXTROSE 5 % IV SOLN
2.0000 g | INTRAVENOUS | Status: DC
  Administered 2013-09-05 – 2013-09-07 (×3): 2 g via INTRAVENOUS
  Filled 2013-09-05 (×3): qty 2

## 2013-09-05 MED ORDER — POTASSIUM CHLORIDE 10 MEQ/50ML IV SOLN
10.0000 meq | INTRAVENOUS | Status: AC
  Administered 2013-09-05 (×5): 10 meq via INTRAVENOUS
  Filled 2013-09-05 (×3): qty 50

## 2013-09-05 MED ORDER — INSULIN ASPART 100 UNIT/ML ~~LOC~~ SOLN
0.0000 [IU] | Freq: Three times a day (TID) | SUBCUTANEOUS | Status: DC
  Administered 2013-09-05: 5 [IU] via SUBCUTANEOUS
  Administered 2013-09-05: 8 [IU] via SUBCUTANEOUS
  Administered 2013-09-06 – 2013-09-07 (×3): 5 [IU] via SUBCUTANEOUS

## 2013-09-05 MED ORDER — CARVEDILOL 12.5 MG PO TABS
12.5000 mg | ORAL_TABLET | Freq: Two times a day (BID) | ORAL | Status: DC
  Administered 2013-09-06 – 2013-09-07 (×3): 12.5 mg via ORAL
  Filled 2013-09-05 (×7): qty 1

## 2013-09-05 MED ORDER — INSULIN ASPART 100 UNIT/ML ~~LOC~~ SOLN: 0.0000 [IU] | Freq: Every day | SUBCUTANEOUS | Status: DC

## 2013-09-05 NOTE — Progress Notes (Signed)
Report called and received from [place]. Pt. To be transferred from 79m.

## 2013-09-05 NOTE — Progress Notes (Addendum)
PULMONARY / CRITICAL CARE MEDICINE  Name: Bridget Martinez MRN: [id number] DOB: 05/24/1951    ADMISSION DATE:  Oct 02, 2013 CONSULTATION DATE:  2013-10-02  REFERRING MD :  EDP PRIMARY SERVICE: PCCM  CHIEF COMPLAINT:  DKA and respiratory failure  BRIEF PATIENT DESCRIPTION: 62 yo with DM not taking insulin x 1 day admitted with DKA and requiring mechanical ventilation for severe acidosis.nded and PCCM was called to admit.  SIGNIFICANT EVENTS / STUDIES:  Oct 03, 2023  Head CT >>> nad October 03, 2023  TTE >>> EF 40-45%, RWMA cannot be excluded, grade 2 diastolic dysfunction  LINES / TUBES: OETT 2023/10/03 >>> 12/27 OGT 10-03-23 >>> 12/27 L IJ TLC 03-Oct-2023 >>> 12/27 Foley October 03, 2023 >>> 12/27  CULTURES: 03-Oct-2023  MRSA >>> neg October 03, 2023  C.dif >>> neg Oct 03, 2023  Blood >>> 2023-10-03  Urine >>> BACILLUS  ANTIBIOTICS: Vancomycin 2023-10-03 >>> 12/28 Zosyn October 03, 2023 >>> 12/28 Ceftriaxone 12/28 >>>  INTERVAL HISTORY:  No issues overnight. K replaced by eMD.  VITAL SIGNS: Temp:  [98.1 F (36.7 C)-99.2 F (37.3 C)] 98.3 F (36.8 C) (12/28 0800) Pulse Rate:  [70-89] 70 (12/28 0600) Resp:  [13-19] 16 (12/28 0600) BP: (80-115)/(28-62) 96/44 mmHg (12/28 0600) SpO2:  [84 %-100 %] 97 % (12/28 0600) Arterial Line BP: (109-153)/(29-42) 126/39 mmHg (12/27 1800)  HEMODYNAMICS: CVP:  [1 mmHg-8 mmHg] 1 mmHg  VENTILATOR SETTINGS:    INTAKE / OUTPUT: Intake/Output     12/27 0701 - 12/28 0700 12/28 0701 - 12/29 0700   P.O. 440    I.V. (mL/kg) 1112 (12.4)    NG/GT     IV Piggyback 712.5 425   Total Intake(mL/kg) 2264.5 (25.2) 425 (4.7)   Urine (mL/kg/hr) 275 (0.1)    Total Output 275     Net +1989.5 +425        Stool Occurrence 1 x     PHYSICAL EXAMINATION: General:  Sitting in the chair, in no distress Neuro:  Awake, alert, appropriate  HEENT:  PERRL, moist membranes Cardiovascular:  RRR, no murmurs Lungs:  CTAB Abdomen:  Non tender, no rebound Musculoskeletal:  Trace edema Skin:  Intact.  LABS:  CBC  Recent Labs Lab  02-Oct-2013 1727 09/04/13 0300 09/05/13 0315  WBC 16.9* 12.3* 11.4*  HGB 9.0* 8.7* 8.6*  HCT 28.0* 25.5* 24.5*  PLT 314 242 220   Coag's  Recent Labs Lab 10/02/13 1054  APTT 23*  INR 1.55*   BMET  Recent Labs Lab 09/04/13 0930 09/04/13 1400 09/05/13 0315  NA 146* 146* 143  K 3.0* 3.1* 3.0*  CL 106 108 106  CO2 24 26 29   BUN 20 18 14   CREATININE 0.95 0.93 0.91  GLUCOSE 186* 168* 150*   Electrolytes  Recent Labs Lab 10/02/13 1054 10/02/13 1207  09/04/13 0300 09/04/13 0930 09/04/13 1400 09/05/13 0315  CALCIUM 8.3*  --   < > 7.0* 7.0* 6.5* 6.8*  MG  --  2.7*  --  1.6  --   --   --   PHOS  --  10.2*  --  0.7*  --   --   --   < > = values in this interval not displayed. Sepsis Markers  Recent Labs Lab 10/02/2013 1103  LATICACIDVEN 1.39   ABG  Recent Labs Lab 10-02-2013 1559 October 02, 2013 1850 09/04/13 0444  PHART 6.938* 7.274* 7.560*  PCO2ART 25.6* 18.9* 26.1*  PO2ART 164.0* 182.0* 171.0*   Liver Enzymes  Recent Labs Lab Oct 02, 2013 1054  AST 12  ALT 10  ALKPHOS 189*  BILITOT 0.1*  ALBUMIN 2.9*   Cardiac Enzymes  Recent Labs Lab 09/04/13 1530 09/04/13 2126 09/05/13 0315  TROPONINI 1.64* 1.54* 1.01*   Glucose  Recent Labs Lab 09/04/13 1535 09/04/13 1653 09/04/13 1949 09/04/13 2328 09/05/13 0323 09/05/13 0727  GLUCAP 163* 119* 185* 145* 142* 220*   CXR: None today  ASSESSMENT / PLAN:  PULMONARY A: Acute respiratory failure in setting of acute encephalopathy and severe acidosis - resolved. P:   Goal SpO2>92 Supplemental oxygen PRN  CARDIOVASCULAR A: SIRS / sepsis - resoled. Lactate reassuring. 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 NESTEM vs demand ischemia, troponin decreasing. P:  Goal MAP>65 ASA Restart preadmission Coreg, Vasotec, Plavix D/c CVL  RENAL A:  AKI - resolving. Hypokalemia.  P:   Trend BMP K 10 x 5 Hold preadmission Lasix, Aldactone for now  GASTROINTESTINAL A:  Diarrhea. Viral  gastroenteritis? C.dif PCR negative P:   C.dif PCR Protonix preadmission Carb modified diet  HEMATOLOGIC A:  Mild coagulopathy. Anemia. P:  Trend CBC / INR  INFECTIOUS A:  No overt infection. P:   Cultures / antibiotics as above Simplify abx  ENDOCRINE A:  DKA - resolved. DM. Hyperglycemia. Adrenal insufficiency ruled out ( cortisol 30 ). Hypothyroidism. P:   SSI Increase Lantus to 15 D/c d5 Preadmission Synthroid  NEUROLOGIC A: Acute encephalopathy - resolved. P:   Restart preadmission Lexapro  Transfer to telemetry. PCCM will sign off. TRH to pick up on 12/29.  I have personally obtained history, examined patient, evaluated and interpreted laboratory and imaging results, reviewed medical records, formulated assessment / plan and placed orders.  Doree Fudge, MD Pulmonary and [place] Pager: [phone]  09/05/2013, 10:21 AM

## 2013-09-05 NOTE — Progress Notes (Signed)
[facility] Progress Note Patient Name: Bridget Martinez DOB: 1951-04-18 MRN: [id number]  Date of Service  09/05/2013   HPI/Events of Note  Low k    eICU Interventions  k supp   Intervention Category Intermediate Interventions: Electrolyte abnormality - evaluation and management  [name]. 09/05/2013, 5:19 AM

## 2013-09-06 LAB — GLUCOSE, CAPILLARY
Glucose-Capillary: 107 mg/dL — ABNORMAL HIGH (ref 70–99)
Glucose-Capillary: 179 mg/dL — ABNORMAL HIGH (ref 70–99)

## 2013-09-06 NOTE — Progress Notes (Signed)
Inpatient Diabetes Program Recommendations  AACE/ADA: New Consensus Statement on Inpatient Glycemic Control (2013)  Target Ranges:  Prepandial:   less than 140 mg/dL      Peak postprandial:   less than 180 mg/dL (1-2 hours)      Critically ill patients:  140 - 180 mg/dL  Results for Bridget Martinez, Bridget Martinez (MRN [id number]) as of 09/06/2013 13:44  Ref. Range 09/05/2013 11:25 09/05/2013 17:19 09/05/2013 21:31 09/06/2013 07:53 09/06/2013 12:15  Glucose-Capillary Latest Range: 70-99 mg/dL 204 (H) 275 (H) 186 (H) 107 (H) 244 (H)   Inpatient Diabetes Program Recommendations Insulin - Meal Coverage: consider adding Novolog 5 units TID with meals per Glycemic control set for elevated postprandials Thank you  Raoul Pitch BSN, RN,CDE Inpatient Diabetes Coordinator [phone] (team pager)

## 2013-09-06 NOTE — Progress Notes (Signed)
[facility] PROGRESS NOTE  Bridget Martinez [id number] DOB: July 19, 1951 DOA: 09/03/2013 PCP: No primary provider on file.  HPI/Subjective: Denies any nausea/vomiting/abdominal pain. Denies any chest pain, shortness of breath or vomiting.  Assessment/Plan: Active Problems:   Acute respiratory failure   Altered mental status   DKA (diabetic ketoacidoses)   Shock circulatory   Septic shock -Patient given to the hospital with temperature of 91.8, had blood pressure 72/36. -She was intubated, had central left IJ catheter. -This is resolved, unclear for secondary to dehydration or infection. -Blood culture one out of two showing coagulase-negative staph, urine cultures showed Bacillus species.  Acute respiratory failure -Patient was intubated in the emergency department secondary to altered mental status and circulatory collapse. -Be extubated without any difficulty. -Chest x-ray did not show any acute findings.  DKA -Patient came in with extreme acidosis, her pH was 6.94, her bicarbonate was less than 7. -Patient treated with intensive IV insulin therapy, her DKA resolved and bicarbonate normalized. -Her anion gap closed and acidosis resolved. -Currently she is tolerating regular food since last night. Continue subcutaneous insulin.  Non-STEMI -Patient had a troponin peak of 3.1. -This is likely type 2 non-STEMI secondary to demand ischemia from hypotension/shock. -Patient reported that she had CABG 2 years ago, 2-D echocardiogram did not show wall motion abnormalities. -Continue metoprolol, aspirin and Plavix. Patient currently denies any chest pain.  Code Status: Full code Family Communication: Plan discussed with the patient. Disposition Plan: Remains inpatient   Consultants:  [facility] assumed care on 09/06/2013, was on PCCM service  Procedures:  None  Antibiotics:  Was on vancomycin and Zosyn, currently on ceftriaxone.   Objective: Filed  Vitals:   09/06/13 0832  BP: 142/72  Pulse: 72  Temp:   Resp:     Intake/Output Summary (Last 24 hours) at 09/06/13 1323 Last data filed at 09/05/13 1857  Gross per 24 hour  Intake    240 ml  Output      0 ml  Net    240 ml   Filed Weights   09/03/13 1500 09/04/13 0300 09/05/13 1531  Weight: 88.1 kg (194 lb 3.6 oz) 89.7 kg (197 lb 12 oz) 92.2 kg (203 lb 4.2 oz)    Exam: General: Alert and awake, oriented x3, not in any acute distress. HEENT: anicteric sclera, pupils reactive to light and accommodation, EOMI CVS: S1-S2 clear, no murmur rubs or gallops Chest: clear to auscultation bilaterally, no wheezing, rales or rhonchi Abdomen: soft nontender, nondistended, normal bowel sounds, no organomegaly Extremities: no cyanosis, clubbing or edema noted bilaterally Neuro: Cranial nerves II-XII intact, no focal neurological deficits  Data Reviewed: Basic Metabolic Panel:  Recent Labs Lab 09/03/13 1103 09/03/13 1207  09/03/13 1700 09/04/13 0300 09/04/13 0930 09/04/13 1400 09/05/13 0315  NA 128*  --   < > 145 149* 146* 146* 143  K 8.1*  --   < > 3.6 2.8* 3.0* 3.1* 3.0*  CL 105  --   < > 105 109 106 108 106  CO2  --   --   < > 8* 20 24 26 29   GLUCOSE >700*  --   < > 408* 162* 186* 168* 150*  BUN 45*  --   < > 36* 24* 20 18 14   CREATININE 1.70*  --   < > 1.32* 0.99 0.95 0.93 0.91  CALCIUM  --   --   < > 6.8* 7.0* 7.0* 6.5* 6.8*  MG  --  2.7*  --   --  1.6  --   --   --   PHOS  --  10.2*  --   --  0.7*  --   --   --   < > = values in this interval not displayed. Liver Function Tests:  Recent Labs Lab 09/03/13 1054  AST 12  ALT 10  ALKPHOS 189*  BILITOT 0.1*  PROT 6.4  ALBUMIN 2.9*   No results found for this basename: LIPASE, AMYLASE,  in the last 168 hours No results found for this basename: AMMONIA,  in the last 168 hours CBC:  Recent Labs Lab 09/03/13 1054 09/03/13 1103 09/03/13 1540 09/03/13 1727 09/04/13 0300 09/05/13 0315  WBC 20.2*  --  19.8* 16.9*  12.3* 11.4*  NEUTROABS 17.2*  --   --   --   --   --   HGB 10.0* 11.9* 9.3* 9.0* 8.7* 8.6*  HCT 34.4* 35.0* 30.0* 28.0* 25.5* 24.5*  MCV 100.3*  --  92.3 89.7 84.7 83.9  PLT 374  --  337 314 242 220   Cardiac Enzymes:  Recent Labs Lab 09/03/13 1054 09/04/13 0934 09/04/13 1530 09/04/13 2126 09/05/13 0315  TROPONINI <0.30 3.10* 1.64* 1.54* 1.01*   BNP (last 3 results) No results found for this basename: PROBNP,  in the last 8760 hours CBG:  Recent Labs Lab 09/05/13 1125 09/05/13 1719 09/05/13 2131 09/06/13 0753 09/06/13 1215  GLUCAP 204* 275* 186* 107* 244*    Micro Recent Results (from the past 240 hour(s))  CULTURE, BLOOD (ROUTINE X 2)     Status: None   Collection Time    09/03/13 10:30 AM      Result Value Range Status   Specimen Description BLOOD RIGHT ANTECUBITAL   Final   Special Requests     Final   Value: BOTTLES DRAWN AEROBIC AND ANAEROBIC 10CC AER 5CC ANA   Culture  Setup Time     Final   Value: 09/03/2013 14:26     Performed at [facility]   Culture     Final   Value:        BLOOD CULTURE RECEIVED NO GROWTH TO DATE CULTURE WILL BE HELD FOR 5 DAYS BEFORE ISSUING A FINAL NEGATIVE REPORT     Performed at [facility]   Report Status PENDING   Incomplete  CULTURE, BLOOD (ROUTINE X 2)     Status: None   Collection Time    09/03/13 10:50 AM      Result Value Range Status   Specimen Description BLOOD HAND RIGHT   Final   Special Requests     Final   Value: BOTTLES DRAWN AEROBIC AND ANAEROBIC 10CC AER 5CC ANA   Culture  Setup Time     Final   Value: 09/03/2013 14:26     Performed at [facility]   Culture     Final   Value: STAPHYLOCOCCUS SPECIES (COAGULASE NEGATIVE)     Note: THE SIGNIFICANCE OF ISOLATING THIS ORGANISM FROM A SINGLE SET OF BLOOD CULTURES WHEN MULTIPLE SETS ARE DRAWN IS UNCERTAIN. PLEASE NOTIFY THE MICROBIOLOGY DEPARTMENT WITHIN ONE WEEK IF SPECIATION AND SENSITIVITIES ARE REQUIRED.     Note: Gram Stain Report  Called to,Read Back By and Verified With: [facility] MACASERO 09/04/13 AT 0750 [place]     Performed at [facility]   Report Status 09/05/2013 FINAL   Final  URINE CULTURE     Status: None   Collection Time    09/03/13 11:17 AM  Result Value Range Status   Specimen Description URINE, CATHETERIZED   Final   Special Requests NONE   Final   Culture  Setup Time     Final   Value: 09/03/2013 12:24     Performed at [facility]     Final   Value: >=100,000 COLONIES/ML     Performed at [facility]   Culture     Final   Value: BACILLUS SPECIES     Note: Standardized susceptibility testing for this organism is not available.     Performed at [facility]   Report Status 09/04/2013 FINAL   Final  MRSA PCR SCREENING     Status: None   Collection Time    09/03/13  3:07 PM      Result Value Range Status   MRSA by PCR NEGATIVE  NEGATIVE Final   Comment:            The GeneXpert MRSA Assay (FDA     approved for NASAL specimens     only), is one component of a     comprehensive MRSA colonization     surveillance program. It is not     intended to diagnose MRSA     infection nor to guide or     monitor treatment for     MRSA infections.  CULTURE, BLOOD (ROUTINE X 2)     Status: None   Collection Time    09/03/13  3:40 PM      Result Value Range Status   Specimen Description BLOOD A-LINE DRAW   Final   Special Requests BOTTLES DRAWN AEROBIC ONLY 10CC   Final   Culture  Setup Time     Final   Value: 09/03/2013 20:43     Performed at [facility]   Culture     Final   Value:        BLOOD CULTURE RECEIVED NO GROWTH TO DATE CULTURE WILL BE HELD FOR 5 DAYS BEFORE ISSUING A FINAL NEGATIVE REPORT     Performed at [facility]   Report Status PENDING   Incomplete  CULTURE, BLOOD (ROUTINE X 2)     Status: None   Collection Time    09/03/13  3:40 PM      Result Value Range Status   Specimen Description BLOOD HAND LEFT   Final    Special Requests BOTTLES DRAWN AEROBIC ONLY 1CC   Final   Culture  Setup Time     Final   Value: 09/03/2013 20:43     Performed at [facility]   Culture     Final   Value:        BLOOD CULTURE RECEIVED NO GROWTH TO DATE CULTURE WILL BE HELD FOR 5 DAYS BEFORE ISSUING A FINAL NEGATIVE REPORT     Performed at [facility]   Report Status PENDING   Incomplete  CLOSTRIDIUM DIFFICILE BY PCR     Status: None   Collection Time    09/04/13  9:26 AM      Result Value Range Status   C difficile by pcr NEGATIVE  NEGATIVE Final     Studies: No results found.  Scheduled Meds: . aspirin EC  81 mg Oral Daily  . carvedilol  12.5 mg Oral BID WC  . cefTRIAXone (ROCEPHIN)  IV  2 g Intravenous Q24H  . clopidogrel  75 mg Oral Q breakfast  . enalapril  5 mg  Oral BID  . escitalopram  10 mg Oral Daily  . heparin  5,000 Units Subcutaneous Q8H  . insulin aspart  0-15 Units Subcutaneous TID WC  . insulin aspart  0-5 Units Subcutaneous QHS  . insulin glargine  15 Units Subcutaneous QHS  . levothyroxine  125 mcg Oral QAC breakfast  . pantoprazole  40 mg Oral Q24H   Continuous Infusions:      Time spent: 35 minutes    [facility]  [facility] Pager [phone] If 7PM-7AM, please contact night-coverage at [email or web address], password [facility] 09/06/2013, 1:23 PM  LOS: 3 days

## 2013-09-07 LAB — BASIC METABOLIC PANEL
BUN: 8 mg/dL (ref 6–23)
Calcium: 8.5 mg/dL (ref 8.4–10.5)
GFR calc Af Amer: 88 mL/min — ABNORMAL LOW (ref >90)
GFR calc non Af Amer: 76 mL/min — ABNORMAL LOW (ref >90)
Glucose, Bld: 187 mg/dL — ABNORMAL HIGH (ref 70–99)
Potassium: 4.4 mEq/L (ref 3.7–5.3)
Sodium: 139 mEq/L (ref 137–147)

## 2013-09-07 LAB — CBC
Hemoglobin: 10.6 g/dL — ABNORMAL LOW (ref 12.0–15.0)
MCH: 28.9 pg (ref 26.0–34.0)
MCHC: 32.8 g/dL (ref 30.0–36.0)
Platelets: 209 10*3/uL (ref 150–400)
RDW: 14.6 % (ref 11.5–15.5)
WBC: 6.8 10*3/uL (ref 4.0–10.5)

## 2013-09-07 LAB — GLUCOSE, CAPILLARY: Glucose-Capillary: 116 mg/dL — ABNORMAL HIGH (ref 70–99)

## 2013-09-07 NOTE — Discharge Summary (Signed)
Physician Discharge Summary  Harvest Blevens [id number] DOB: [birth year] DOA: 09/03/2013  PCP: No primary provider on file.  Admit date: 09/03/2013 Discharge date: 09/07/2013  Time spent: 40 minutes  Recommendations for Outpatient Follow-up:  1. Followup with primary care physician within one week  Discharge Diagnoses:  Active Problems:   Acute respiratory failure   Altered mental status   DKA (diabetic ketoacidoses)   Shock circulatory   Discharge Condition: Stable  Diet recommendation: Carbohydrate modified diet  Filed Weights   09/03/13 1500 09/04/13 0300 09/05/13 1531  Weight: 88.1 kg (194 lb 3.6 oz) 89.7 kg (197 lb 12 oz) 92.2 kg (203 lb 4.2 oz)    History of present illness:  62 year old female with extensive PMH who did not take her insulin on the day PTP because she was not feeling well. Patient reported she was not feeling well and started having some vomitting after which point she became lethargic and patient was brought to the ED. In the ED she was noted to be hyperglycemic and was severely acidotic. Shortly after arriving to the ED patient became obtunded and PCCM was called to admit.  Hospital Course:   1. Septic shock: Patient presented to the hospital was temperature of 91.8, blood pressure 72/37, she was intubated and left IJ central venous catheter was placed. Her hypothermia and hypotension resolved 3 at the source is unclear, differential diagnoses include massive volume depletion secondary to DKA versus infection. Blood cultures one out of two show coagulase-negative staph, urine cultures showed bacillus species. Patient was on antibiotics since the day of admission,, currently this is day 4 of antibiotics the time of discharge no antibiotics prescribed.  2. Acute respiratory failure: Patient was intubated in the emergency department secondary to altered mental status and circulatory collapse, extubated without difficulty on the 27th. Chest x-ray did not show  any acute findings.  3. DKA: Patient has insulin-dependent diabetes mellitus, came in to the hospital with extreme acidosis, her pH was 6.94, her bicarbonate was less than 7. Patient admitted to the ICU and treated with intensive IV insulin therapy, her DKA resolved and bicarbonate normalized. Her anion gap closed as well as acidosis resolved. Currently she tolerates regular food since 2 days ago, her subcutaneous insulin restarted at her home dose.  4. Non-STEMI: Patient had a troponin peak of 3.1. This is likely type II non-STEMI secondary to demand ischemia from hypotension/shock. Patient reported that she had CABG 2 years ago, 2-D echocardiogram did not show wall motion abnormalities, patient denies any active chest pain. Her medication including metoprolol, aspirin and Plavix were continued.  5. IDDM: Patient restarted back on her regular insulin dose. Patient to followup with her primary care physician for further evaluation.  Procedures:  Intubation, left IJ CVL, NG tube and Foley were all discontinued on the 27th.  2-D echocardiogram showed LVEF of 0000000, grade 2 diastolic dysfunction.  Consultations:  Patient was in the PCCM carol 09/05/2013, [facility] assumed care on 09/06/2012.  Discharge Exam: Filed Vitals:   09/07/13 0410  BP: 110/70  Pulse: 68  Temp: 98.6 F (37 C)  Resp: 16   General: Alert and awake, oriented x3, not in any acute distress. HEENT: anicteric sclera, pupils reactive to light and accommodation, EOMI CVS: S1-S2 clear, no murmur rubs or gallops Chest: clear to auscultation bilaterally, no wheezing, rales or rhonchi Abdomen: soft nontender, nondistended, normal bowel sounds, no organomegaly Extremities: no cyanosis, clubbing or edema noted bilaterally Neuro: Cranial nerves II-XII intact, no focal  neurological deficits  Discharge Instructions  Discharge Orders   Future Orders Complete By Expires   Diet Carb Modified  As directed    Increase  activity slowly  As directed        Medication List         alendronate 70 MG tablet  Commonly known as:  FOSAMAX  Take 70 mg by mouth once a week. Take with a full glass of water on an empty stomach.     aspirin EC 325 MG tablet  Take 325 mg by mouth daily.     carvedilol 12.5 MG tablet  Commonly known as:  COREG  Take 12.5 mg by mouth 2 (two) times daily with a meal.     clopidogrel 75 MG tablet  Commonly known as:  PLAVIX  Take 75 mg by mouth daily with breakfast.     enalapril 5 MG tablet  Commonly known as:  VASOTEC  Take 5 mg by mouth 2 (two) times daily.     escitalopram 10 MG tablet  Commonly known as:  LEXAPRO  Take 10 mg by mouth daily.     furosemide 20 MG tablet  Commonly known as:  LASIX  Take 40 mg by mouth daily.     insulin detemir 100 UNIT/ML injection  Commonly known as:  LEVEMIR  Inject 20 Units into the skin 2 (two) times daily.     insulin lispro 100 UNIT/ML injection  Commonly known as:  HUMALOG  Inject 5-8 Units into the skin 3 (three) times daily before meals. Per sliding scale     levothyroxine 125 MCG tablet  Commonly known as:  SYNTHROID, LEVOTHROID  Take 125 mcg by mouth daily before breakfast.     pantoprazole 40 MG tablet  Commonly known as:  PROTONIX  Take 40 mg by mouth daily.     potassium chloride 10 MEQ tablet  Commonly known as:  K-DUR  Take 10 mEq by mouth daily.     rosuvastatin 40 MG tablet  Commonly known as:  CRESTOR  Take 40 mg by mouth daily.     spironolactone 25 MG tablet  Commonly known as:  ALDACTONE  Take 25 mg by mouth daily.       Allergies  Allergen Reactions  . Sulfa Antibiotics     Difficulty breathing, stiff neck      The results of significant diagnostics from this hospitalization (including imaging, microbiology, ancillary and laboratory) are listed below for reference.    Significant Diagnostic Studies: Ct Head (brain) Wo Contrast  09/03/2013   CLINICAL DATA:  Altered mental status  and nonresponsive.  EXAM: CT HEAD WITHOUT CONTRAST  TECHNIQUE: Contiguous axial images were obtained from the base of the skull through the vertex without intravenous contrast.  COMPARISON:  None.  FINDINGS: The brain demonstrates no evidence of hemorrhage, infarction, edema, mass effect, extra-axial fluid collection, hydrocephalus or mass lesion. The skull is unremarkable. Calcified protuberances along the inner table of the left frontal skull may represent a focal calcified meningioma. No mass effect or adjacent edema is seen.  IMPRESSION: No acute findings.   Electronically Signed   By: Aletta Edouard M.D.   On: 09/03/2013 10:52   Dg Chest Port 1 View  09/04/2013   CLINICAL DATA:  Hypoxia  EXAM: PORTABLE CHEST - 1 VIEW  COMPARISON:  September 03, 2013  FINDINGS: Endotracheal tube tip is 1.4 cm above the carina. Central catheter tip is just beyond the cavoatrial junction in the right atrium. Nasogastric  tube tip and side port are in the stomach. No pneumothorax.  There is no edema or consolidation. Heart is mildly enlarged with normal pulmonary vascularity. No adenopathy. Patient is status post coronary artery bypass grafting.  IMPRESSION: The tube and catheter positions are as described. No pneumothorax. Note that the endotracheal tube tip is near the carina. It may be prudent to withdrawal this tube 3-4 cm.  There is no edema or consolidation.  Heart is prominent but stable.   Electronically Signed   By: Lowella Grip M.D.   On: 09/04/2013 08:54   Dg Chest Port 1 View  09/03/2013   CLINICAL DATA:  Intubation.  EXAM: PORTABLE CHEST - 1 VIEW  COMPARISON:  None.  FINDINGS: Endotracheal tube approximately 3 cm above carina. Left IJ tube noted with its tip projected over superior vena cava. Lungs are clear. No pleural effusion or pneumothorax. Prior CABG. Cardiomegaly, no pulmonary venous congestion. No acute osseous abnormality.  IMPRESSION: 1. Endotracheal tube noted with its tip 3 cm above carina. Left  IJ line in good anatomic position. 2. Cardiomegaly, no pulmonary venous congestion.  Prior CABG.   Electronically Signed   By: Marcello Moores  Register   On: 09/03/2013 13:06   Dg Abd Portable 1v  09/03/2013   CLINICAL DATA:  OG tube placement  EXAM: PORTABLE ABDOMEN - 1 VIEW  COMPARISON:  None.  FINDINGS: An orogastric tube is appreciated with distal portion extending along the expected course of the stomach, tip projecting in the region of the gastric antrum. Air is otherwise appreciated within nondilated loops of large small bowel. The osseous structures unremarkable. Patient is status post median sternotomy.  IMPRESSION: OG tube extending along the expected course of the stomach. Nonobstructive bowel gas pattern.   Electronically Signed   By: Margaree Mackintosh M.D.   On: 09/03/2013 16:26    Microbiology: Recent Results (from the past 240 hour(s))  CULTURE, BLOOD (ROUTINE X 2)     Status: None   Collection Time    09/03/13 10:30 AM      Result Value Range Status   Specimen Description BLOOD RIGHT ANTECUBITAL   Final   Special Requests     Final   Value: BOTTLES DRAWN AEROBIC AND ANAEROBIC 10CC AER 5CC ANA   Culture  Setup Time     Final   Value: 09/03/2013 14:26     Performed at [facility]   Culture     Final   Value:        BLOOD CULTURE RECEIVED NO GROWTH TO DATE CULTURE WILL BE HELD FOR 5 DAYS BEFORE ISSUING A FINAL NEGATIVE REPORT     Performed at [facility]   Report Status PENDING   Incomplete  CULTURE, BLOOD (ROUTINE X 2)     Status: None   Collection Time    09/03/13 10:50 AM      Result Value Range Status   Specimen Description BLOOD HAND RIGHT   Final   Special Requests     Final   Value: BOTTLES DRAWN AEROBIC AND ANAEROBIC 10CC AER 5CC ANA   Culture  Setup Time     Final   Value: 09/03/2013 14:26     Performed at [facility]   Culture     Final   Value: STAPHYLOCOCCUS SPECIES (COAGULASE NEGATIVE)     Note: THE SIGNIFICANCE OF ISOLATING THIS ORGANISM  FROM A SINGLE SET OF BLOOD CULTURES WHEN MULTIPLE SETS ARE DRAWN IS UNCERTAIN. PLEASE NOTIFY THE MICROBIOLOGY  DEPARTMENT WITHIN ONE WEEK IF SPECIATION AND SENSITIVITIES ARE REQUIRED.     Note: Gram Stain Report Called to,Read Back By and Verified With: [facility] MACASERO 09/04/13 AT 0750 RIDK     Performed at [facility]   Report Status 09/05/2013 FINAL   Final  URINE CULTURE     Status: None   Collection Time    09/03/13 11:17 AM      Result Value Range Status   Specimen Description URINE, CATHETERIZED   Final   Special Requests NONE   Final   Culture  Setup Time     Final   Value: 09/03/2013 12:24     Performed at [facility] Count     Final   Value: >=100,000 COLONIES/ML     Performed at [facility]   Culture     Final   Value: BACILLUS SPECIES     Note: Standardized susceptibility testing for this organism is not available.     Performed at [facility]   Report Status 09/04/2013 FINAL   Final  MRSA PCR SCREENING     Status: None   Collection Time    09/03/13  3:07 PM      Result Value Range Status   MRSA by PCR NEGATIVE  NEGATIVE Final   Comment:            The GeneXpert MRSA Assay (FDA     approved for NASAL specimens     only), is one component of a     comprehensive MRSA colonization     surveillance program. It is not     intended to diagnose MRSA     infection nor to guide or     monitor treatment for     MRSA infections.  CULTURE, BLOOD (ROUTINE X 2)     Status: None   Collection Time    09/03/13  3:40 PM      Result Value Range Status   Specimen Description BLOOD A-LINE DRAW   Final   Special Requests BOTTLES DRAWN AEROBIC ONLY 10CC   Final   Culture  Setup Time     Final   Value: 09/03/2013 20:43     Performed at [facility]   Culture     Final   Value:        BLOOD CULTURE RECEIVED NO GROWTH TO DATE CULTURE WILL BE HELD FOR 5 DAYS BEFORE ISSUING A FINAL NEGATIVE REPORT     Performed at [facility]    Report Status PENDING   Incomplete  CULTURE, BLOOD (ROUTINE X 2)     Status: None   Collection Time    09/03/13  3:40 PM      Result Value Range Status   Specimen Description BLOOD HAND LEFT   Final   Special Requests BOTTLES DRAWN AEROBIC ONLY 1CC   Final   Culture  Setup Time     Final   Value: 09/03/2013 20:43     Performed at [facility]   Culture     Final   Value:        BLOOD CULTURE RECEIVED NO GROWTH TO DATE CULTURE WILL BE HELD FOR 5 DAYS BEFORE ISSUING A FINAL NEGATIVE REPORT     Performed at [facility]   Report Status PENDING   Incomplete  CLOSTRIDIUM DIFFICILE BY PCR     Status: None   Collection Time    09/04/13  9:26 AM  Result Value Range Status   C difficile by pcr NEGATIVE  NEGATIVE Final     Labs: Basic Metabolic Panel:  Recent Labs Lab 09/03/13 1103 09/03/13 1207  09/03/13 1700 09/04/13 0300 09/04/13 0930 09/04/13 1400 09/05/13 0315  NA 128*  --   < > 145 149* 146* 146* 143  K 8.1*  --   < > 3.6 2.8* 3.0* 3.1* 3.0*  CL 105  --   < > 105 109 106 108 106  CO2  --   --   < > 8* 20 24 26 29   GLUCOSE >700*  --   < > 408* 162* 186* 168* 150*  BUN 45*  --   < > 36* 24* 20 18 14   CREATININE 1.70*  --   < > 1.32* 0.99 0.95 0.93 0.91  CALCIUM  --   --   < > 6.8* 7.0* 7.0* 6.5* 6.8*  MG  --  2.7*  --   --  1.6  --   --   --   PHOS  --  10.2*  --   --  0.7*  --   --   --   < > = values in this interval not displayed. Liver Function Tests:  Recent Labs Lab 09/03/13 1054  AST 12  ALT 10  ALKPHOS 189*  BILITOT 0.1*  PROT 6.4  ALBUMIN 2.9*   No results found for this basename: LIPASE, AMYLASE,  in the last 168 hours No results found for this basename: AMMONIA,  in the last 168 hours CBC:  Recent Labs Lab 09/03/13 1054  09/03/13 1540 09/03/13 1727 09/04/13 0300 09/05/13 0315 09/07/13 0905  WBC 20.2*  --  19.8* 16.9* 12.3* 11.4* 6.8  NEUTROABS 17.2*  --   --   --   --   --   --   HGB 10.0*  < > 9.3* 9.0* 8.7* 8.6* 10.6*   HCT 34.4*  < > 30.0* 28.0* 25.5* 24.5* 32.3*  MCV 100.3*  --  92.3 89.7 84.7 83.9 88.0  PLT 374  --  337 314 242 220 209  < > = values in this interval not displayed. Cardiac Enzymes:  Recent Labs Lab 09/03/13 1054 09/04/13 0934 09/04/13 1530 09/04/13 2126 09/05/13 0315  TROPONINI <0.30 3.10* 1.64* 1.54* 1.01*   BNP: BNP (last 3 results) No results found for this basename: PROBNP,  in the last 8760 hours CBG:  Recent Labs Lab 09/06/13 0753 09/06/13 1215 09/06/13 1744 09/06/13 2115 09/07/13 0757  GLUCAP 107* 244* 215* 179* 116*       Signed:  Nainoa Woldt A  [facility] 09/07/2013, 10:24 AM

## 2013-09-07 NOTE — Progress Notes (Signed)
Bridget Martinez to be D/C'd Home per MD order.  Discussed with the patient and all questions fully answered.    Medication List         alendronate 70 MG tablet  Commonly known as:  FOSAMAX  Take 70 mg by mouth once a week. Take with a full glass of water on an empty stomach.     aspirin EC 325 MG tablet  Take 325 mg by mouth daily.     carvedilol 12.5 MG tablet  Commonly known as:  COREG  Take 12.5 mg by mouth 2 (two) times daily with a meal.     clopidogrel 75 MG tablet  Commonly known as:  PLAVIX  Take 75 mg by mouth daily with breakfast.     enalapril 5 MG tablet  Commonly known as:  VASOTEC  Take 5 mg by mouth 2 (two) times daily.     escitalopram 10 MG tablet  Commonly known as:  LEXAPRO  Take 10 mg by mouth daily.     furosemide 20 MG tablet  Commonly known as:  LASIX  Take 40 mg by mouth daily.     insulin detemir 100 UNIT/ML injection  Commonly known as:  LEVEMIR  Inject 20 Units into the skin 2 (two) times daily.     insulin lispro 100 UNIT/ML injection  Commonly known as:  HUMALOG  Inject 5-8 Units into the skin 3 (three) times daily before meals. Per sliding scale     levothyroxine 125 MCG tablet  Commonly known as:  SYNTHROID, LEVOTHROID  Take 125 mcg by mouth daily before breakfast.     pantoprazole 40 MG tablet  Commonly known as:  PROTONIX  Take 40 mg by mouth daily.     potassium chloride 10 MEQ tablet  Commonly known as:  K-DUR  Take 10 mEq by mouth daily.     rosuvastatin 40 MG tablet  Commonly known as:  CRESTOR  Take 40 mg by mouth daily.     spironolactone 25 MG tablet  Commonly known as:  ALDACTONE  Take 25 mg by mouth daily.        VVS, Skin clean, dry and intact without evidence of skin break down, no evidence of skin tears noted. IV catheter discontinued intact. Site without signs and symptoms of complications. Dressing and pressure applied.  An After Visit Summary was printed and given to the patient. Follow up appointments  , new prescriptions and medication administration times given. Hand out given and discussed on sick days for diabetes Patient escorted via [place], and D/C home via private auto.  Park Breed, RN 09/07/2013 7:56 PM

## 2013-09-07 NOTE — Progress Notes (Signed)
09/07/2013 1400 No NCM needs identified. Jonnie Finner RN CCM Case Mgmt phone [phone]

## 2013-09-08 ENCOUNTER — Encounter (HOSPITAL_COMMUNITY): Payer: Self-pay

## 2013-09-09 LAB — CULTURE, BLOOD (ROUTINE X 2)
Culture: NO GROWTH
Culture: NO GROWTH
Culture: NO GROWTH

## 2013-09-21 ENCOUNTER — Encounter (HOSPITAL_COMMUNITY): Payer: Self-pay

## 2013-09-21 ENCOUNTER — Ambulatory Visit (HOSPITAL_COMMUNITY)
Admission: RE | Admit: 2013-09-21 | Discharge: 2013-09-21 | Disposition: A | Discharge status: Home / Self Care | Payer: BC Managed Care – PPO | Source: Ambulatory Visit | Attending: Internal Medicine | Admitting: Internal Medicine

## 2013-09-21 ENCOUNTER — Ambulatory Visit (HOSPITAL_BASED_OUTPATIENT_CLINIC_OR_DEPARTMENT_OTHER)
Admission: RE | Admit: 2013-09-21 | Discharge: 2013-09-21 | Disposition: A | Discharge status: Home / Self Care | Payer: BC Managed Care – PPO | Source: Ambulatory Visit | Attending: Internal Medicine | Admitting: Internal Medicine

## 2013-09-21 VITALS — BP 126/52 | HR 68 | Wt 178.8 lb

## 2013-09-21 DIAGNOSIS — I509 Heart failure, unspecified: Secondary | ICD-10-CM | POA: Insufficient documentation

## 2013-09-21 DIAGNOSIS — I5022 Chronic systolic (congestive) heart failure: Secondary | ICD-10-CM

## 2013-09-21 DIAGNOSIS — I2510 Atherosclerotic heart disease of native coronary artery without angina pectoris: Secondary | ICD-10-CM

## 2013-09-21 DIAGNOSIS — I079 Rheumatic tricuspid valve disease, unspecified: Secondary | ICD-10-CM | POA: Insufficient documentation

## 2013-09-21 DIAGNOSIS — E1029 Type 1 diabetes mellitus with other diabetic kidney complication: Secondary | ICD-10-CM

## 2013-09-21 DIAGNOSIS — I359 Nonrheumatic aortic valve disorder, unspecified: Secondary | ICD-10-CM | POA: Insufficient documentation

## 2013-09-21 DIAGNOSIS — I10 Essential (primary) hypertension: Secondary | ICD-10-CM

## 2013-09-21 NOTE — Progress Notes (Signed)
  Echocardiogram 2D Echocardiogram has been performed.  Bridget Martinez 09/21/2013, 8:15 AM

## 2013-09-21 NOTE — Progress Notes (Addendum)
Patient ID: Bridget Martinez, female   DOB: 11-09-1950, 63 y.o.   MRN: [id number]  PCP: Dr Laurance Flatten Endocrinologist: Dr Buddy Duty  HPI: 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   Admitted to the hospital 08/24/13 for altered mental status and was found to be severely acidotic and was intubated. Glucose on admission >700, temp 91.8 and BP 72/37. Blood cultures one out of two show coagulase-negative staph, urine cultures showed bacillus species  ECHO  11/21/11 35-40% 07/30/12  EF  45-50% Severe hypokinesis of inferior posterior wall 09/21/13 EF 35-40% with grade II DD (reviewed by Dr. Aundra Dubin)  Follow up: Recently admitted to the hospital for hyperglycemia and altered mental status and required ventilation. Doing much better since discharge and reports sugars better controlled. Denies SOB, orthopnea or CP. + LE edema. Taking medications as prescribed and following low salt diet. Since last visit increased spironolactone to 25 mg daily.   Labs 01/2013: K+ 4.0, Creatinine 1.31, BUN 35        08/2013: K+ 4.4, Cr 0.81    ROS: All systems negative except as listed in HPI, PMH and Problem List.  Past Medical History  Diagnosis Date  . Diabetes mellitus     on insulin, with h/o DKA   . HTN (hypertension)   . Hyperlipidemia   . Venous stasis ulcers   . Angina   . Arthritis   . Anxiety   . Depression   . 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 Heart murmur     Dr Haroldine Laws cardiologist  . Diabetes mellitus without complication   . Hypertension   . Myocardial infarction   . Coronary artery disease   . Pneumonia   . Anginal pain   . GERD (gastroesophageal reflux disease)   . Hypothyroidism   . CHF (congestive heart failure)   . H/O hiatal hernia     Current Outpatient Prescriptions  Medication Sig Dispense Refill  . alendronate (FOSAMAX) 70 MG tablet Take 70 mg by mouth  once a week. Take with a full glass of water on an empty stomach.      Marland Kitchen aspirin EC 325 MG tablet Take 325 mg by mouth daily.      . carvedilol (COREG) 12.5 MG tablet Take 12.5 mg by mouth 2 (two) times daily with a meal.      . clopidogrel (PLAVIX) 75 MG tablet Take 75 mg by mouth daily with breakfast.      . enalapril (VASOTEC) 2.5 MG tablet Take 2.5 mg by mouth 2 (two) times daily.      Marland Kitchen escitalopram (LEXAPRO) 10 MG tablet Take 1 tablet (10 mg total) by mouth daily.  30 tablet  1  . furosemide (LASIX) 20 MG tablet Take 20 mg by mouth daily.       . insulin aspart (NOVOLOG) 100 UNIT/ML injection Inject 100 Units into the skin 3 (three) times daily with meals. 14 units at breakfast, 12 units at lunch, 7 units supper Plus 1 unit for every 35mg /dL above 100 mg/dL      . insulin detemir (LEVEMIR) 100 UNIT/ML injection Inject 20 Units into the skin 2 (two) times daily.      . insulin lispro (HUMALOG) 100 UNIT/ML injection Inject 5-8 Units into the skin 3 (three) times daily before meals. Per sliding scale      . levothyroxine (  SYNTHROID, LEVOTHROID) 125 MCG tablet Take 1 tablet (125 mcg total) by mouth daily.  90 tablet  0  . pantoprazole (PROTONIX) 40 MG tablet Take 40 mg by mouth daily.      . potassium chloride (K-DUR) 10 MEQ tablet Take 1 tablet (10 mEq total) by mouth daily.  30 tablet  3  . rosuvastatin (CRESTOR) 40 MG tablet Take 40 mg by mouth daily.      Marland Kitchen spironolactone (ALDACTONE) 25 MG tablet Take 25 mg by mouth daily.       No current facility-administered medications for this encounter.    Filed Vitals:   09/21/13 0913  BP: 126/52  Pulse: 68  Weight: 178 lb 12.8 oz (81.103 kg)  SpO2: 100%    PHYSICAL EXAM:  General:  Well appearing. No resp difficulty HEENT: normal Neck: supple. JVP flat Carotids 2+ bilaterally; no bruits. No lymphadenopathy or thryomegaly appreciated. Cor: PMI normal. Regular rate & rhythm. No rubs, gallops or murmurs. Lungs: clear Abdomen: soft,  nontender, nondistended. No hepatosplenomegaly. No bruits or masses. Good bowel sounds. Extremities: no cyanosis, clubbing, rash, trace edema, TED hose intact Neuro: alert & orientedx3, cranial nerves grossly intact. Moves all 4 extremities w/o difficulty. Affect pleasant.  ASSESSMENT & PLAN:   1) Chronic systolic HF: ICM, EF 123456 (09/2013) - NYHA I-II symptoms and volume status good. Will continue current dose of lasix. Have asked the patient to call when she gets home to confirm dosage we have 40 mg daily on our records and she has from [place] 20 mg daily. As well she will call to confirm enalapril dosage 5 mg BID or 2.5 mg BID. - Continue coreg 12.5 mg BID and spiro 25 mg daily.  - Reviewed labs and last BMET stable.  - Reinforced the need and importance of daily weights, a low sodium diet, and fluid restriction (less than 2 L a day). Instructed to call the HF clinic if weight increases more than 3 lbs overnight or 5 lbs in a week.  2) HTN - Controlled on BB, ACE-I, Arlyce Harman - As above patient to call to confirm home doses of medications. 3) HLD- -Managed by [facility]. Continue statin 4) CAD s/p CABG x 4 - Continue plavix, ASA, BB, ACE-I and statin - no s/s of ischemia 5) DM2 - Reviewed recent admission to hospital for altered mental status and hyperglycemia blood sugar >700. She reports better controlled and have asked her to continue to follow closely. She has follow up with Dr. Buddy Duty in the next 2 weeks. She is on an ACE-I.   F/U 4-6 months  Junie Bame B NP-C 9:27 AM

## 2013-09-21 NOTE — Addendum Note (Signed)
Encounter addended by: Rande Brunt, NP on: 09/21/2013  3:41 PM<BR>     Documentation filed: Notes Section

## 2013-09-21 NOTE — Patient Instructions (Signed)
Doing great.  Continue to follow blood sugars closely.  Please call [phone] when you get home and let us know if you have enalapril 5 mg tablets or 2.5 mg tablets and how you are taking that medication as well as your lasix.  Call any issues.  F/U 4-6 months  Do the following things EVERYDAY: 1) Weigh yourself in the morning before breakfast. Write it down and keep it in a log. 2) Take your medicines as prescribed 3) Eat low salt foods-Limit salt (sodium) to 2000 mg per day.  4) Stay as active as you can everyday 5) Limit all fluids for the day to less than 2 liters 6)

## 2013-09-22 ENCOUNTER — Other Ambulatory Visit: Payer: Self-pay | Admitting: Nurse Practitioner

## 2013-09-22 ENCOUNTER — Ambulatory Visit: Payer: Self-pay | Admitting: Family Medicine

## 2013-09-23 ENCOUNTER — Other Ambulatory Visit: Payer: Self-pay | Admitting: *Deleted

## 2013-09-23 MED ORDER — ESCITALOPRAM OXALATE 10 MG PO TABS: 10.0000 mg | ORAL_TABLET | Freq: Every day | ORAL | Status: DC

## 2013-09-23 NOTE — Telephone Encounter (Signed)
Not seen since 04/14

## 2013-09-27 ENCOUNTER — Encounter: Payer: Self-pay | Admitting: Family Medicine

## 2013-09-27 ENCOUNTER — Ambulatory Visit (INDEPENDENT_AMBULATORY_CARE_PROVIDER_SITE_OTHER): Payer: BC Managed Care – PPO | Admitting: Family Medicine

## 2013-09-27 VITALS — BP 117/59 | HR 72 | Temp 98.4°F | Ht 63.0 in | Wt 180.0 lb

## 2013-09-27 DIAGNOSIS — E876 Hypokalemia: Secondary | ICD-10-CM

## 2013-09-27 DIAGNOSIS — F32A Depression, unspecified: Secondary | ICD-10-CM

## 2013-09-27 DIAGNOSIS — F329 Major depressive disorder, single episode, unspecified: Secondary | ICD-10-CM

## 2013-09-27 DIAGNOSIS — E785 Hyperlipidemia, unspecified: Secondary | ICD-10-CM

## 2013-09-27 DIAGNOSIS — R7989 Other specified abnormal findings of blood chemistry: Secondary | ICD-10-CM

## 2013-09-27 DIAGNOSIS — N390 Urinary tract infection, site not specified: Secondary | ICD-10-CM

## 2013-09-27 DIAGNOSIS — F3289 Other specified depressive episodes: Secondary | ICD-10-CM

## 2013-09-27 DIAGNOSIS — E039 Hypothyroidism, unspecified: Secondary | ICD-10-CM

## 2013-09-27 DIAGNOSIS — M810 Age-related osteoporosis without current pathological fracture: Secondary | ICD-10-CM

## 2013-09-27 LAB — POCT UA - MICROSCOPIC ONLY
Bacteria, U Microscopic: NEGATIVE
Casts, Ur, LPF, POC: NEGATIVE
Crystals, Ur, HPF, POC: NEGATIVE
Mucus, UA: NEGATIVE
Yeast, UA: NEGATIVE

## 2013-09-27 LAB — POCT CBC
Granulocyte percent: 73.3 %G (ref 37–80)
HCT, POC: 35.2 % — AB (ref 37.7–47.9)
Hemoglobin: 10.7 g/dL — AB (ref 12.2–16.2)
Lymph, poc: 1.5 (ref 0.6–3.4)
MCH, POC: 26.8 pg — AB (ref 27–31.2)
MCHC: 30.5 g/dL — AB (ref 31.8–35.4)
MCV: 88 fL (ref 80–97)
MPV: 7.6 fL (ref 0–99.8)
POC Granulocyte: 5.3 (ref 2–6.9)
POC LYMPH PERCENT: 21 %L (ref 10–50)
Platelet Count, POC: 326 10*3/uL (ref 142–424)
RBC: 4 M/uL — AB (ref 4.04–5.48)
RDW, POC: 15.9 %
WBC: 7.2 10*3/uL (ref 4.6–10.2)

## 2013-09-27 LAB — POCT UA - MICROALBUMIN: Microalbumin Ur, POC: 50 mg/L

## 2013-09-27 MED ORDER — ESCITALOPRAM OXALATE 10 MG PO TABS: 10.0000 mg | ORAL_TABLET | Freq: Every day | ORAL | Status: DC

## 2013-09-27 MED ORDER — ALENDRONATE SODIUM 70 MG PO TABS: 70.0000 mg | ORAL_TABLET | ORAL | Status: DC

## 2013-09-27 MED ORDER — ROSUVASTATIN CALCIUM 40 MG PO TABS: 40.0000 mg | ORAL_TABLET | Freq: Every day | ORAL | Status: DC

## 2013-09-27 MED ORDER — LEVOTHYROXINE SODIUM 125 MCG PO TABS: 125.0000 ug | ORAL_TABLET | Freq: Every day | ORAL | Status: DC

## 2013-09-27 NOTE — Patient Instructions (Signed)
Urinary Tract Infection  Urinary tract infections (UTIs) can develop anywhere along your urinary tract. Your urinary tract is your body's drainage system for removing wastes and extra water. Your urinary tract includes two kidneys, two ureters, a bladder, and a urethra. Your kidneys are a pair of bean-shaped organs. Each kidney is about the size of your fist. They are located below your ribs, one on each side of your spine.  CAUSES  Infections are caused by microbes, which are microscopic organisms, including fungi, viruses, and bacteria. These organisms are so small that they can only be seen through a microscope. Bacteria are the microbes that most commonly cause UTIs.  SYMPTOMS   Symptoms of UTIs may vary by age and gender of the patient and by the location of the infection. Symptoms in young women typically include a frequent and intense urge to urinate and a painful, burning feeling in the bladder or urethra during urination. Older women and men are more likely to be tired, shaky, and weak and have muscle aches and abdominal pain. A fever may mean the infection is in your kidneys. Other symptoms of a kidney infection include pain in your back or sides below the ribs, nausea, and vomiting.  DIAGNOSIS  To diagnose a UTI, your caregiver will ask you about your symptoms. Your caregiver also will ask to provide a urine sample. The urine sample will be tested for bacteria and white blood cells. White blood cells are made by your body to help fight infection.  TREATMENT   Typically, UTIs can be treated with medication. Because most UTIs are caused by a bacterial infection, they usually can be treated with the use of antibiotics. The choice of antibiotic and length of treatment depend on your symptoms and the type of bacteria causing your infection.  HOME CARE INSTRUCTIONS   If you were prescribed antibiotics, take them exactly as your caregiver instructs you. Finish the medication even if you feel better after you  have only taken some of the medication.   Drink enough water and fluids to keep your urine clear or pale yellow.   Avoid caffeine, tea, and carbonated beverages. They tend to irritate your bladder.   Empty your bladder often. Avoid holding urine for long periods of time.   Empty your bladder before and after sexual intercourse.   After a bowel movement, women should cleanse from front to back. Use each tissue only once.  SEEK MEDICAL CARE IF:    You have back pain.   You develop a fever.   Your symptoms do not begin to resolve within 3 days.  SEEK IMMEDIATE MEDICAL CARE IF:    You have severe back pain or lower abdominal pain.   You develop chills.   You have nausea or vomiting.   You have continued burning or discomfort with urination.  MAKE SURE YOU:    Understand these instructions.   Will watch your condition.   Will get help right away if you are not doing well or get worse.  Document Released: 06/05/2005 Document Revised: 02/25/2012 Document Reviewed: 10/04/2011  Exit[employer] Patient Information 2014 [employer].

## 2013-09-27 NOTE — Progress Notes (Signed)
Subjective:    Patient ID: Bridget Martinez, female    DOB: 04/20/1951, 63 y.o.   MRN: [id number]  HPI  This 63 y.o. female presents for evaluation of follow up on DM, hypertension, and hyperlipidemia. She states she has had to hold her insulin last night due to hypoglycemia sx's.  She has been recently Hospitalized for SIRS and UTI.  She had DKA and had to go on mechanical ventilation.  She was tx'd with IV abx's Vanc and Zosyn.   She was not sent home on abx's.  She is seeing endocrinology and she has recently seen her Cardiologist last week.  She is feeling a lot better.  She has hx of osteoporosis.  She needs refills on her fosamax.  Review of Systems No chest pain, SOB, HA, dizziness, vision change, N/V, diarrhea, constipation, dysuria, urinary urgency or frequency, myalgias, arthralgias or rash.     Objective:   Physical Exam  Vital signs noted  Well developed well nourished female.  HEENT - Head atraumatic Normocephalic                Eyes - PERRLA, Conjuctiva - clear Sclera- Clear EOMI                Ears - EAC's Wnl TM's Wnl Gross Hearing WNL                Nose - Nares patent                 Throat - oropharanx wnl Respiratory - Lungs CTA bilateral Cardiac - RRR S1 and S2 without murmur GI - Abdomen soft Nontender and bowel sounds active x 4 Extremities - No edema. Neuro - Grossly intact.  Results for orders placed in visit on 09/27/13  POCT CBC      Result Value Range   WBC 7.2  4.6 - 10.2 K/uL   Lymph, poc 1.5  0.6 - 3.4   POC LYMPH PERCENT 21.0  10 - 50 %L   POC Granulocyte 5.3  2 - 6.9   Granulocyte percent 73.3  37 - 80 %G   RBC 4.0 (*) 4.04 - 5.48 M/uL   Hemoglobin 10.7 (*) 12.2 - 16.2 g/dL   HCT, POC 35.2 (*) 37.7 - 47.9 %   MCV 88.0  80 - 97 fL   MCH, POC 26.8 (*) 27 - 31.2 pg   MCHC 30.5 (*) 31.8 - 35.4 g/dL   RDW, POC 15.9     Platelet Count, POC 326.0  142 - 424 K/uL   MPV 7.6  0 - 99.8 fL  POCT UA - MICROALBUMIN      Result Value Range   Microalbumin Ur, POC 50    POCT UA - MICROSCOPIC ONLY      Result Value Range   WBC, Ur, HPF, POC 10-15     RBC, urine, microscopic 1-5     Bacteria, U Microscopic neg     Mucus, UA neg     Epithelial cells, urine per micros occ     Crystals, Ur, HPF, POC neg     Casts, Ur, LPF, POC neg     Yeast, UA neg        Assessment & Plan:  Other and unspecified hyperlipidemia - Plan: rosuvastatin (CRESTOR) 40 MG tablet, POCT CBC, Lipid panel, CMP14+EGFR  Osteoporosis, unspecified - Plan: alendronate (FOSAMAX) 70 MG tablet, Vit D  25 hydroxy (rtn osteoporosis monitoring)  Unspecified hypothyroidism - Plan: levothyroxine (SYNTHROID,  LEVOTHROID) 125 MCG tablet, Thyroid Panel With TSH  Depression - Plan: escitalopram (LEXAPRO) 10 MG  Po qd.  UTI (lower urinary tract infection) - Plan: POCT UA - Microalbumin, POCT UA - Microscopic Only, UA cx and tx if positive.  Other abnormal blood chemistry - Plan: Microalbumin, urine.  Lysbeth Penner FNP

## 2013-09-28 LAB — CMP14+EGFR
ALT: 27 IU/L (ref 0–32)
AST: 60 IU/L — ABNORMAL HIGH (ref 0–40)
Albumin/Globulin Ratio: 1.6 (ref 1.1–2.5)
Albumin: 3.9 g/dL (ref 3.6–4.8)
Alkaline Phosphatase: 153 IU/L — ABNORMAL HIGH (ref 39–117)
BUN/Creatinine Ratio: 12 (ref 11–26)
BUN: 10 mg/dL (ref 8–27)
CO2: 20 mmol/L (ref 18–29)
Calcium: 8.9 mg/dL (ref 8.7–10.3)
Chloride: 99 mmol/L (ref 97–108)
Creatinine, Ser: 0.85 mg/dL (ref 0.57–1.00)
GFR calc Af Amer: 84 mL/min/{1.73_m2} (ref >59)
GFR calc non Af Amer: 73 mL/min/{1.73_m2} (ref >59)
Globulin, Total: 2.4 g/dL (ref 1.5–4.5)
Glucose: 223 mg/dL — ABNORMAL HIGH (ref 65–99)
Potassium: 4.6 mmol/L (ref 3.5–5.2)
Sodium: 138 mmol/L (ref 134–144)
Total Bilirubin: 0.3 mg/dL (ref 0.0–1.2)
Total Protein: 6.3 g/dL (ref 6.0–8.5)

## 2013-09-28 LAB — LIPID PANEL
Chol/HDL Ratio: 4 ratio units (ref 0.0–4.4)
Cholesterol, Total: 269 mg/dL — ABNORMAL HIGH (ref 100–199)
HDL: 68 mg/dL (ref >39)
LDL Calculated: 172 mg/dL — ABNORMAL HIGH (ref 0–99)
Triglycerides: 145 mg/dL (ref 0–149)
VLDL Cholesterol Cal: 29 mg/dL (ref 5–40)

## 2013-09-28 LAB — THYROID PANEL WITH TSH
Free Thyroxine Index: 1.5 (ref 1.2–4.9)
T3 Uptake Ratio: 32 % (ref 24–39)
T4, Total: 4.7 ug/dL (ref 4.5–12.0)
TSH: 4.9 u[IU]/mL — ABNORMAL HIGH (ref 0.450–4.500)

## 2013-09-28 LAB — MICROALBUMIN, URINE: Microalbumin, Urine: 56.7 ug/mL — ABNORMAL HIGH (ref 0.0–17.0)

## 2013-09-28 LAB — VITAMIN D 25 HYDROXY (VIT D DEFICIENCY, FRACTURES): Vit D, 25-Hydroxy: 14.3 ng/mL — ABNORMAL LOW (ref 30.0–100.0)

## 2013-09-28 LAB — URINE CULTURE

## 2013-09-29 ENCOUNTER — Telehealth: Payer: Self-pay | Admitting: *Deleted

## 2013-09-29 ENCOUNTER — Other Ambulatory Visit: Payer: Self-pay | Admitting: Family Medicine

## 2013-09-29 DIAGNOSIS — E039 Hypothyroidism, unspecified: Secondary | ICD-10-CM

## 2013-09-29 MED ORDER — ENALAPRIL MALEATE 2.5 MG PO TABS: ORAL_TABLET | ORAL | Status: DC

## 2013-09-29 MED ORDER — VITAMIN D (ERGOCALCIFEROL) 1.25 MG (50000 UNIT) PO CAPS: 50000.0000 [IU] | ORAL_CAPSULE | ORAL | Status: DC

## 2013-09-29 NOTE — Telephone Encounter (Signed)
PT NOTIFIED OF LABS

## 2013-10-07 ENCOUNTER — Other Ambulatory Visit (HOSPITAL_COMMUNITY): Payer: Self-pay | Admitting: Internal Medicine

## 2013-10-09 ENCOUNTER — Encounter: Payer: Self-pay | Admitting: Cardiology

## 2013-10-09 DIAGNOSIS — E1159 Type 2 diabetes mellitus with other circulatory complications: Secondary | ICD-10-CM | POA: Insufficient documentation

## 2013-10-09 DIAGNOSIS — I152 Hypertension secondary to endocrine disorders: Secondary | ICD-10-CM | POA: Insufficient documentation

## 2013-10-09 DIAGNOSIS — I509 Heart failure, unspecified: Secondary | ICD-10-CM | POA: Insufficient documentation

## 2013-10-09 DIAGNOSIS — R011 Cardiac murmur, unspecified: Secondary | ICD-10-CM | POA: Insufficient documentation

## 2013-10-09 DIAGNOSIS — I10 Essential (primary) hypertension: Secondary | ICD-10-CM

## 2013-10-09 DIAGNOSIS — I219 Acute myocardial infarction, unspecified: Secondary | ICD-10-CM | POA: Insufficient documentation

## 2013-10-13 ENCOUNTER — Other Ambulatory Visit (HOSPITAL_COMMUNITY): Payer: Self-pay | Admitting: Internal Medicine

## 2013-10-25 ENCOUNTER — Inpatient Hospital Stay (HOSPITAL_COMMUNITY): Payer: BC Managed Care – PPO

## 2013-10-25 ENCOUNTER — Encounter (HOSPITAL_COMMUNITY): Payer: Self-pay | Admitting: Emergency Medicine

## 2013-10-25 ENCOUNTER — Emergency Department (HOSPITAL_COMMUNITY): Payer: BC Managed Care – PPO

## 2013-10-25 ENCOUNTER — Inpatient Hospital Stay (HOSPITAL_COMMUNITY)
Admission: EM | Admit: 2013-10-25 | Discharge: 2013-10-29 | DRG: 637 | Disposition: A | Discharge status: Home / Self Care | Payer: BC Managed Care – PPO | Attending: Internal Medicine | Admitting: Internal Medicine

## 2013-10-25 DIAGNOSIS — N058 Unspecified nephritic syndrome with other morphologic changes: Secondary | ICD-10-CM | POA: Diagnosis present

## 2013-10-25 DIAGNOSIS — I2510 Atherosclerotic heart disease of native coronary artery without angina pectoris: Secondary | ICD-10-CM | POA: Diagnosis present

## 2013-10-25 DIAGNOSIS — E871 Hypo-osmolality and hyponatremia: Secondary | ICD-10-CM | POA: Diagnosis present

## 2013-10-25 DIAGNOSIS — E875 Hyperkalemia: Secondary | ICD-10-CM

## 2013-10-25 DIAGNOSIS — I872 Venous insufficiency (chronic) (peripheral): Secondary | ICD-10-CM | POA: Diagnosis present

## 2013-10-25 DIAGNOSIS — Z794 Long term (current) use of insulin: Secondary | ICD-10-CM

## 2013-10-25 DIAGNOSIS — I219 Acute myocardial infarction, unspecified: Secondary | ICD-10-CM

## 2013-10-25 DIAGNOSIS — E876 Hypokalemia: Secondary | ICD-10-CM | POA: Diagnosis not present

## 2013-10-25 DIAGNOSIS — I129 Hypertensive chronic kidney disease with stage 1 through stage 4 chronic kidney disease, or unspecified chronic kidney disease: Secondary | ICD-10-CM | POA: Diagnosis present

## 2013-10-25 DIAGNOSIS — F411 Generalized anxiety disorder: Secondary | ICD-10-CM | POA: Diagnosis present

## 2013-10-25 DIAGNOSIS — K219 Gastro-esophageal reflux disease without esophagitis: Secondary | ICD-10-CM | POA: Diagnosis present

## 2013-10-25 DIAGNOSIS — I959 Hypotension, unspecified: Secondary | ICD-10-CM | POA: Diagnosis present

## 2013-10-25 DIAGNOSIS — Z87440 Personal history of urinary (tract) infections: Secondary | ICD-10-CM

## 2013-10-25 DIAGNOSIS — R011 Cardiac murmur, unspecified: Secondary | ICD-10-CM

## 2013-10-25 DIAGNOSIS — I509 Heart failure, unspecified: Secondary | ICD-10-CM | POA: Diagnosis present

## 2013-10-25 DIAGNOSIS — L97909 Non-pressure chronic ulcer of unspecified part of unspecified lower leg with unspecified severity: Secondary | ICD-10-CM

## 2013-10-25 DIAGNOSIS — B373 Candidiasis of vulva and vagina: Secondary | ICD-10-CM | POA: Diagnosis present

## 2013-10-25 DIAGNOSIS — F3289 Other specified depressive episodes: Secondary | ICD-10-CM | POA: Diagnosis present

## 2013-10-25 DIAGNOSIS — I10 Essential (primary) hypertension: Secondary | ICD-10-CM | POA: Diagnosis present

## 2013-10-25 DIAGNOSIS — I5023 Acute on chronic systolic (congestive) heart failure: Secondary | ICD-10-CM | POA: Diagnosis present

## 2013-10-25 DIAGNOSIS — E872 Acidosis, unspecified: Secondary | ICD-10-CM

## 2013-10-25 DIAGNOSIS — F32A Depression, unspecified: Secondary | ICD-10-CM

## 2013-10-25 DIAGNOSIS — M810 Age-related osteoporosis without current pathological fracture: Secondary | ICD-10-CM | POA: Diagnosis present

## 2013-10-25 DIAGNOSIS — E1029 Type 1 diabetes mellitus with other diabetic kidney complication: Secondary | ICD-10-CM

## 2013-10-25 DIAGNOSIS — E1110 Type 2 diabetes mellitus with ketoacidosis without coma: Secondary | ICD-10-CM

## 2013-10-25 DIAGNOSIS — E785 Hyperlipidemia, unspecified: Secondary | ICD-10-CM | POA: Diagnosis present

## 2013-10-25 DIAGNOSIS — Z833 Family history of diabetes mellitus: Secondary | ICD-10-CM

## 2013-10-25 DIAGNOSIS — N189 Chronic kidney disease, unspecified: Secondary | ICD-10-CM | POA: Diagnosis present

## 2013-10-25 DIAGNOSIS — J209 Acute bronchitis, unspecified: Secondary | ICD-10-CM | POA: Diagnosis present

## 2013-10-25 DIAGNOSIS — D649 Anemia, unspecified: Secondary | ICD-10-CM | POA: Diagnosis present

## 2013-10-25 DIAGNOSIS — F329 Major depressive disorder, single episode, unspecified: Secondary | ICD-10-CM

## 2013-10-25 DIAGNOSIS — E1049 Type 1 diabetes mellitus with other diabetic neurological complication: Secondary | ICD-10-CM

## 2013-10-25 DIAGNOSIS — E1010 Type 1 diabetes mellitus with ketoacidosis without coma: Principal | ICD-10-CM | POA: Diagnosis present

## 2013-10-25 DIAGNOSIS — I252 Old myocardial infarction: Secondary | ICD-10-CM

## 2013-10-25 DIAGNOSIS — I83009 Varicose veins of unspecified lower extremity with ulcer of unspecified site: Secondary | ICD-10-CM

## 2013-10-25 DIAGNOSIS — R579 Shock, unspecified: Secondary | ICD-10-CM

## 2013-10-25 DIAGNOSIS — R4182 Altered mental status, unspecified: Secondary | ICD-10-CM | POA: Diagnosis present

## 2013-10-25 DIAGNOSIS — X31XXXA Exposure to excessive natural cold, initial encounter: Secondary | ICD-10-CM | POA: Diagnosis present

## 2013-10-25 DIAGNOSIS — R1011 Right upper quadrant pain: Secondary | ICD-10-CM

## 2013-10-25 DIAGNOSIS — R0789 Other chest pain: Secondary | ICD-10-CM

## 2013-10-25 DIAGNOSIS — M129 Arthropathy, unspecified: Secondary | ICD-10-CM | POA: Diagnosis present

## 2013-10-25 DIAGNOSIS — Z951 Presence of aortocoronary bypass graft: Secondary | ICD-10-CM

## 2013-10-25 DIAGNOSIS — E1065 Type 1 diabetes mellitus with hyperglycemia: Secondary | ICD-10-CM

## 2013-10-25 DIAGNOSIS — E039 Hypothyroidism, unspecified: Secondary | ICD-10-CM | POA: Diagnosis present

## 2013-10-25 DIAGNOSIS — B3731 Acute candidiasis of vulva and vagina: Secondary | ICD-10-CM | POA: Diagnosis present

## 2013-10-25 DIAGNOSIS — G9341 Metabolic encephalopathy: Secondary | ICD-10-CM | POA: Diagnosis present

## 2013-10-25 DIAGNOSIS — N179 Acute kidney failure, unspecified: Secondary | ICD-10-CM | POA: Diagnosis present

## 2013-10-25 DIAGNOSIS — I447 Left bundle-branch block, unspecified: Secondary | ICD-10-CM | POA: Diagnosis present

## 2013-10-25 DIAGNOSIS — J9600 Acute respiratory failure, unspecified whether with hypoxia or hypercapnia: Secondary | ICD-10-CM

## 2013-10-25 DIAGNOSIS — I5022 Chronic systolic (congestive) heart failure: Secondary | ICD-10-CM

## 2013-10-25 DIAGNOSIS — Z7982 Long term (current) use of aspirin: Secondary | ICD-10-CM

## 2013-10-25 DIAGNOSIS — N390 Urinary tract infection, site not specified: Secondary | ICD-10-CM

## 2013-10-25 LAB — BASIC METABOLIC PANEL
BUN: 36 mg/dL — ABNORMAL HIGH (ref 6–23)
BUN: 33 mg/dL — ABNORMAL HIGH (ref 6–23)
BUN: 29 mg/dL — ABNORMAL HIGH (ref 6–23)
BUN: 28 mg/dL — AB (ref 6–23)
BUN: 27 mg/dL — ABNORMAL HIGH (ref 6–23)
BUN: 26 mg/dL — ABNORMAL HIGH (ref 6–23)
BUN: 25 mg/dL — AB (ref 6–23)
BUN: 24 mg/dL — ABNORMAL HIGH (ref 6–23)
BUN: 23 mg/dL (ref 6–23)
CALCIUM: 7 mg/dL — AB (ref 8.4–10.5)
CALCIUM: 7 mg/dL — AB (ref 8.4–10.5)
CHLORIDE: 96 meq/L (ref 96–112)
CHLORIDE: 112 meq/L (ref 96–112)
CHLORIDE: 116 meq/L — AB (ref 96–112)
CHLORIDE: 116 meq/L — AB (ref 96–112)
CO2: <7 mEq/L — CL (ref 19–32)
CO2: <7 mEq/L — CL (ref 19–32)
CO2: 9 meq/L — AB (ref 19–32)
CO2: 11 mEq/L — ABNORMAL LOW (ref 19–32)
CO2: 12 mEq/L — ABNORMAL LOW (ref 19–32)
CO2: 13 meq/L — AB (ref 19–32)
CO2: 14 meq/L — AB (ref 19–32)
CREATININE: 1.46 mg/dL — AB (ref 0.50–1.10)
CREATININE: 1.32 mg/dL — AB (ref 0.50–1.10)
Calcium: 8.6 mg/dL (ref 8.4–10.5)
Calcium: 7.5 mg/dL — ABNORMAL LOW (ref 8.4–10.5)
Calcium: 6.5 mg/dL — ABNORMAL LOW (ref 8.4–10.5)
Calcium: 6.6 mg/dL — ABNORMAL LOW (ref 8.4–10.5)
Calcium: 6.9 mg/dL — ABNORMAL LOW (ref 8.4–10.5)
Calcium: 6.9 mg/dL — ABNORMAL LOW (ref 8.4–10.5)
Calcium: 7.2 mg/dL — ABNORMAL LOW (ref 8.4–10.5)
Chloride: 85 mEq/L — ABNORMAL LOW (ref 96–112)
Chloride: 109 mEq/L (ref 96–112)
Chloride: 115 mEq/L — ABNORMAL HIGH (ref 96–112)
Chloride: 115 mEq/L — ABNORMAL HIGH (ref 96–112)
Chloride: 117 mEq/L — ABNORMAL HIGH (ref 96–112)
Creatinine, Ser: 1.35 mg/dL — ABNORMAL HIGH (ref 0.50–1.10)
Creatinine, Ser: 1.2 mg/dL — ABNORMAL HIGH (ref 0.50–1.10)
Creatinine, Ser: 1.29 mg/dL — ABNORMAL HIGH (ref 0.50–1.10)
Creatinine, Ser: 1.27 mg/dL — ABNORMAL HIGH (ref 0.50–1.10)
Creatinine, Ser: 1.31 mg/dL — ABNORMAL HIGH (ref 0.50–1.10)
Creatinine, Ser: 1.36 mg/dL — ABNORMAL HIGH (ref 0.50–1.10)
Creatinine, Ser: 1.4 mg/dL — ABNORMAL HIGH (ref 0.50–1.10)
GFR calc Af Amer: 55 mL/min — ABNORMAL LOW (ref >90)
GFR calc Af Amer: 49 mL/min — ABNORMAL LOW (ref >90)
GFR calc Af Amer: 45 mL/min — ABNORMAL LOW (ref >90)
GFR calc non Af Amer: 41 mL/min — ABNORMAL LOW (ref >90)
GFR calc non Af Amer: 47 mL/min — ABNORMAL LOW (ref >90)
GFR calc non Af Amer: 43 mL/min — ABNORMAL LOW (ref >90)
GFR calc non Af Amer: 42 mL/min — ABNORMAL LOW (ref >90)
GFR calc non Af Amer: 40 mL/min — ABNORMAL LOW (ref >90)
GFR calc non Af Amer: 39 mL/min — ABNORMAL LOW (ref >90)
GFR, EST AFRICAN AMERICAN: 43 mL/min — AB (ref >90)
GFR, EST AFRICAN AMERICAN: 47 mL/min — AB (ref >90)
GFR, EST AFRICAN AMERICAN: 50 mL/min — AB (ref >90)
GFR, EST AFRICAN AMERICAN: 49 mL/min — AB (ref >90)
GFR, EST AFRICAN AMERICAN: 51 mL/min — AB (ref >90)
GFR, EST AFRICAN AMERICAN: 47 mL/min — AB (ref >90)
GFR, EST NON AFRICAN AMERICAN: 37 mL/min — AB (ref >90)
GFR, EST NON AFRICAN AMERICAN: 44 mL/min — AB (ref >90)
GFR, EST NON AFRICAN AMERICAN: 42 mL/min — AB (ref >90)
GLUCOSE: 415 mg/dL — AB (ref 70–99)
GLUCOSE: 163 mg/dL — AB (ref 70–99)
Glucose, Bld: 930 mg/dL (ref 70–99)
Glucose, Bld: 711 mg/dL (ref 70–99)
Glucose, Bld: 267 mg/dL — ABNORMAL HIGH (ref 70–99)
Glucose, Bld: 148 mg/dL — ABNORMAL HIGH (ref 70–99)
Glucose, Bld: 141 mg/dL — ABNORMAL HIGH (ref 70–99)
Glucose, Bld: 174 mg/dL — ABNORMAL HIGH (ref 70–99)
Glucose, Bld: 189 mg/dL — ABNORMAL HIGH (ref 70–99)
POTASSIUM: 4.2 meq/L (ref 3.7–5.3)
POTASSIUM: 4.2 meq/L (ref 3.7–5.3)
POTASSIUM: 4.3 meq/L (ref 3.7–5.3)
POTASSIUM: 4.1 meq/L (ref 3.7–5.3)
POTASSIUM: 3.8 meq/L (ref 3.7–5.3)
Potassium: 7.3 mEq/L (ref 3.7–5.3)
Potassium: 5.7 mEq/L — ABNORMAL HIGH (ref 3.7–5.3)
Potassium: 4.2 mEq/L (ref 3.7–5.3)
Potassium: 4.4 mEq/L (ref 3.7–5.3)
SODIUM: 144 meq/L (ref 137–147)
SODIUM: 147 meq/L (ref 137–147)
SODIUM: 147 meq/L (ref 137–147)
SODIUM: 145 meq/L (ref 137–147)
Sodium: 129 mEq/L — ABNORMAL LOW (ref 137–147)
Sodium: 135 mEq/L — ABNORMAL LOW (ref 137–147)
Sodium: 142 mEq/L (ref 137–147)
Sodium: 148 mEq/L — ABNORMAL HIGH (ref 137–147)
Sodium: 147 mEq/L (ref 137–147)

## 2013-10-25 LAB — URINALYSIS, ROUTINE W REFLEX MICROSCOPIC
BILIRUBIN URINE: NEGATIVE
GLUCOSE, UA: 100 mg/dL — AB
Glucose, UA: >1000 mg/dL — AB
Hgb urine dipstick: NEGATIVE
Ketones, ur: 15 mg/dL — AB
LEUKOCYTES UA: NEGATIVE
Leukocytes, UA: NEGATIVE
Nitrite: NEGATIVE
Nitrite: NEGATIVE
PROTEIN: NEGATIVE mg/dL
PROTEIN: 30 mg/dL — AB
SPECIFIC GRAVITY, URINE: 1.016 (ref 1.005–1.030)
Specific Gravity, Urine: 1.026 (ref 1.005–1.030)
UROBILINOGEN UA: 0.2 mg/dL (ref 0.0–1.0)
UROBILINOGEN UA: 0.2 mg/dL (ref 0.0–1.0)
pH: 5 (ref 5.0–8.0)
pH: 5 (ref 5.0–8.0)

## 2013-10-25 LAB — POCT I-STAT, CHEM 8
BUN: 55 mg/dL — AB (ref 6–23)
BUN: 29 mg/dL — ABNORMAL HIGH (ref 6–23)
CALCIUM ION: 1.14 mmol/L (ref 1.13–1.30)
CHLORIDE: 102 meq/L (ref 96–112)
Calcium, Ion: 1.17 mmol/L (ref 1.13–1.30)
Chloride: 109 mEq/L (ref 96–112)
Creatinine, Ser: 1.5 mg/dL — ABNORMAL HIGH (ref 0.50–1.10)
Creatinine, Ser: 1.3 mg/dL — ABNORMAL HIGH (ref 0.50–1.10)
Glucose, Bld: >700 mg/dL (ref 70–99)
Glucose, Bld: >700 mg/dL (ref 70–99)
HCT: 40 % (ref 36.0–46.0)
HEMATOCRIT: 34 % — AB (ref 36.0–46.0)
Hemoglobin: 13.6 g/dL (ref 12.0–15.0)
Hemoglobin: 11.6 g/dL — ABNORMAL LOW (ref 12.0–15.0)
POTASSIUM: 5.6 meq/L — AB (ref 3.7–5.3)
Potassium: 7.6 mEq/L (ref 3.7–5.3)
SODIUM: 129 meq/L — AB (ref 137–147)
Sodium: 138 mEq/L (ref 137–147)
TCO2: 7 mmol/L (ref 0–100)
TCO2: 6 mmol/L (ref 0–100)

## 2013-10-25 LAB — LACTIC ACID, PLASMA: LACTIC ACID, VENOUS: 1.3 mmol/L (ref 0.5–2.2)

## 2013-10-25 LAB — MAGNESIUM
MAGNESIUM: 1.7 mg/dL (ref 1.5–2.5)
Magnesium: 1.7 mg/dL (ref 1.5–2.5)

## 2013-10-25 LAB — CBC
HEMATOCRIT: 30.3 % — AB (ref 36.0–46.0)
Hemoglobin: 9.5 g/dL — ABNORMAL LOW (ref 12.0–15.0)
MCH: 29.7 pg (ref 26.0–34.0)
MCHC: 31.4 g/dL (ref 30.0–36.0)
MCV: 94.7 fL (ref 78.0–100.0)
Platelets: 265 10*3/uL (ref 150–400)
RBC: 3.2 MIL/uL — AB (ref 3.87–5.11)
RDW: 15 % (ref 11.5–15.5)
WBC: 13.8 10*3/uL — ABNORMAL HIGH (ref 4.0–10.5)

## 2013-10-25 LAB — POCT I-STAT 3, ART BLOOD GAS (G3+)
Acid-base deficit: 15 mmol/L — ABNORMAL HIGH (ref 0.0–2.0)
BICARBONATE: 11 meq/L — AB (ref 20.0–24.0)
O2 SAT: 94 %
PCO2 ART: 27.7 mmHg — AB (ref 35.0–45.0)
PH ART: 7.21 — AB (ref 7.350–7.450)
PO2 ART: 85 mmHg (ref 80.0–100.0)
Patient temperature: 99.2
TCO2: 12 mmol/L (ref 0–100)

## 2013-10-25 LAB — POCT I-STAT 3, VENOUS BLOOD GAS (G3P V)
Acid-base deficit: 26 mmol/L — ABNORMAL HIGH (ref 0.0–2.0)
Bicarbonate: 4.9 mEq/L — ABNORMAL LOW (ref 20.0–24.0)
O2 SAT: 55 %
TCO2: 6 mmol/L (ref 0–100)
pCO2, Ven: 23 mmHg — ABNORMAL LOW (ref 45.0–50.0)
pH, Ven: 6.937 — CL (ref 7.250–7.300)
pO2, Ven: 45 mmHg (ref 30.0–45.0)

## 2013-10-25 LAB — CBC WITH DIFFERENTIAL/PLATELET
BASOS ABS: 0 10*3/uL (ref 0.0–0.1)
Basophils Relative: 0 % (ref 0–1)
EOS PCT: 0 % (ref 0–5)
Eosinophils Absolute: 0 10*3/uL (ref 0.0–0.7)
HCT: 35.3 % — ABNORMAL LOW (ref 36.0–46.0)
Hemoglobin: 11 g/dL — ABNORMAL LOW (ref 12.0–15.0)
LYMPHS ABS: 1.3 10*3/uL (ref 0.7–4.0)
Lymphocytes Relative: 8 % — ABNORMAL LOW (ref 12–46)
MCH: 30 pg (ref 26.0–34.0)
MCHC: 31.2 g/dL (ref 30.0–36.0)
MCV: 96.2 fL (ref 78.0–100.0)
MONO ABS: 1 10*3/uL (ref 0.1–1.0)
Monocytes Relative: 6 % (ref 3–12)
Neutro Abs: 13.8 10*3/uL — ABNORMAL HIGH (ref 1.7–7.7)
Neutrophils Relative %: 86 % — ABNORMAL HIGH (ref 43–77)
PLATELETS: 342 10*3/uL (ref 150–400)
RBC: 3.67 MIL/uL — ABNORMAL LOW (ref 3.87–5.11)
RDW: 15.1 % (ref 11.5–15.5)
WBC: 16.1 10*3/uL — AB (ref 4.0–10.5)

## 2013-10-25 LAB — URINE MICROSCOPIC-ADD ON

## 2013-10-25 LAB — PHOSPHORUS
PHOSPHORUS: 2.9 mg/dL (ref 2.3–4.6)
Phosphorus: 2.2 mg/dL — ABNORMAL LOW (ref 2.3–4.6)

## 2013-10-25 LAB — TROPONIN I

## 2013-10-25 LAB — GLUCOSE, CAPILLARY
GLUCOSE-CAPILLARY: 573 mg/dL — AB (ref 70–99)
GLUCOSE-CAPILLARY: 311 mg/dL — AB (ref 70–99)
GLUCOSE-CAPILLARY: 252 mg/dL — AB (ref 70–99)
GLUCOSE-CAPILLARY: 185 mg/dL — AB (ref 70–99)
GLUCOSE-CAPILLARY: 143 mg/dL — AB (ref 70–99)
GLUCOSE-CAPILLARY: 151 mg/dL — AB (ref 70–99)
GLUCOSE-CAPILLARY: 124 mg/dL — AB (ref 70–99)
GLUCOSE-CAPILLARY: 153 mg/dL — AB (ref 70–99)
GLUCOSE-CAPILLARY: 162 mg/dL — AB (ref 70–99)
Glucose-Capillary: >600 mg/dL (ref 70–99)
Glucose-Capillary: 516 mg/dL — ABNORMAL HIGH (ref 70–99)
Glucose-Capillary: 355 mg/dL — ABNORMAL HIGH (ref 70–99)
Glucose-Capillary: 127 mg/dL — ABNORMAL HIGH (ref 70–99)
Glucose-Capillary: 157 mg/dL — ABNORMAL HIGH (ref 70–99)

## 2013-10-25 LAB — URINE CULTURE
Colony Count: NO GROWTH
Culture: NO GROWTH

## 2013-10-25 LAB — MRSA PCR SCREENING: MRSA BY PCR: NEGATIVE

## 2013-10-25 LAB — TSH: TSH: 0.37 u[IU]/mL (ref 0.350–4.500)

## 2013-10-25 LAB — APTT: aPTT: 20 seconds — ABNORMAL LOW (ref 24–37)

## 2013-10-25 LAB — CK: Total CK: 25 U/L (ref 7–177)

## 2013-10-25 LAB — CORTISOL: CORTISOL PLASMA: 30 ug/dL

## 2013-10-25 LAB — PROTIME-INR
INR: 1.42 (ref 0.00–1.49)
PROTHROMBIN TIME: 17 s — AB (ref 11.6–15.2)

## 2013-10-25 LAB — CG4 I-STAT (LACTIC ACID): LACTIC ACID, VENOUS: 1.83 mmol/L (ref 0.5–2.2)

## 2013-10-25 MED ORDER — POTASSIUM PHOSPHATE DIBASIC 3 MMOLE/ML IV SOLN: 20.0000 mmol | Freq: Once | INTRAVENOUS | Status: DC

## 2013-10-25 MED ORDER — POTASSIUM PHOSPHATE DIBASIC 3 MMOLE/ML IV SOLN
20.0000 mmol | Freq: Once | INTRAVENOUS | Status: AC
  Administered 2013-10-25: 20 mmol via INTRAVENOUS
  Filled 2013-10-25: qty 6.67

## 2013-10-25 MED ORDER — ONDANSETRON HCL 4 MG/2ML IJ SOLN
INTRAMUSCULAR | Status: AC
  Administered 2013-10-25: 4 mg via INTRAVENOUS
  Filled 2013-10-25: qty 2

## 2013-10-25 MED ORDER — DEXTROSE-NACL 5-0.45 % IV SOLN: INTRAVENOUS | Status: DC

## 2013-10-25 MED ORDER — DEXTROSE 50 % IV SOLN: 25.0000 mL | INTRAVENOUS | Status: DC | PRN

## 2013-10-25 MED ORDER — SODIUM CHLORIDE 0.9 % IV SOLN
Freq: Once | INTRAVENOUS | Status: AC
  Administered 2013-10-25: 05:00:00 via INTRAVENOUS

## 2013-10-25 MED ORDER — SODIUM CHLORIDE 0.9 % IV SOLN
INTRAVENOUS | Status: DC
  Administered 2013-10-25: 1.9 [IU]/h via INTRAVENOUS
  Filled 2013-10-25 (×3): qty 1

## 2013-10-25 MED ORDER — SODIUM CHLORIDE 0.9 % IV BOLUS (SEPSIS)
1000.0000 mL | Freq: Once | INTRAVENOUS | Status: AC
  Administered 2013-10-25: 1000 mL via INTRAVENOUS

## 2013-10-25 MED ORDER — HEPARIN SODIUM (PORCINE) 5000 UNIT/ML IJ SOLN
5000.0000 [IU] | Freq: Three times a day (TID) | INTRAMUSCULAR | Status: DC
  Administered 2013-10-25 – 2013-10-29 (×12): 5000 [IU] via SUBCUTANEOUS
  Filled 2013-10-25 (×15): qty 1

## 2013-10-25 MED ORDER — ASPIRIN EC 325 MG PO TBEC
325.0000 mg | DELAYED_RELEASE_TABLET | Freq: Every day | ORAL | Status: DC
  Administered 2013-10-26 – 2013-10-29 (×4): 325 mg via ORAL
  Filled 2013-10-25 (×5): qty 1

## 2013-10-25 MED ORDER — FLUCONAZOLE 200 MG PO TABS
200.0000 mg | ORAL_TABLET | Freq: Every day | ORAL | Status: DC
  Filled 2013-10-25: qty 1

## 2013-10-25 MED ORDER — SODIUM BICARBONATE 8.4 % IV SOLN
50.0000 meq | Freq: Once | INTRAVENOUS | Status: AC
  Administered 2013-10-25: 50 meq via INTRAVENOUS
  Filled 2013-10-25: qty 50

## 2013-10-25 MED ORDER — SODIUM CHLORIDE 0.9 % IV SOLN
INTRAVENOUS | Status: DC
  Administered 2013-10-25: 08:00:00 via INTRAVENOUS

## 2013-10-25 MED ORDER — PIPERACILLIN-TAZOBACTAM 3.375 G IVPB 30 MIN
3.3750 g | Freq: Once | INTRAVENOUS | Status: AC
Start: 2013-10-25 — End: 2013-10-25
  Administered 2013-10-25: 3.375 g via INTRAVENOUS

## 2013-10-25 MED ORDER — SODIUM CHLORIDE 0.9 % IV SOLN: Freq: Once | INTRAVENOUS | Status: DC

## 2013-10-25 MED ORDER — PIPERACILLIN-TAZOBACTAM 3.375 G IVPB 30 MIN
4.5000 g | Freq: Once | INTRAVENOUS | Status: DC
  Filled 2013-10-25: qty 100

## 2013-10-25 MED ORDER — DEXTROSE-NACL 5-0.45 % IV SOLN
INTRAVENOUS | Status: DC
  Administered 2013-10-25: 125 mL/h via INTRAVENOUS
  Administered 2013-10-25 – 2013-10-26 (×2): via INTRAVENOUS

## 2013-10-25 MED ORDER — MAGNESIUM SULFATE 40 MG/ML IJ SOLN
2.0000 g | Freq: Once | INTRAMUSCULAR | Status: AC
  Administered 2013-10-25: 2 g via INTRAVENOUS
  Filled 2013-10-25: qty 50

## 2013-10-25 MED ORDER — SODIUM CHLORIDE 0.9 % IV SOLN
INTRAVENOUS | Status: DC
  Administered 2013-10-25: 5.4 [IU]/h via INTRAVENOUS
  Filled 2013-10-25: qty 1

## 2013-10-25 MED ORDER — SODIUM CHLORIDE 0.9 % IV SOLN
INTRAVENOUS | Status: AC
  Administered 2013-10-25: 04:00:00 via INTRAVENOUS

## 2013-10-25 MED ORDER — PIPERACILLIN-TAZOBACTAM 3.375 G IVPB
3.3750 g | Freq: Three times a day (TID) | INTRAVENOUS | Status: DC
  Administered 2013-10-25 – 2013-10-27 (×6): 3.375 g via INTRAVENOUS
  Filled 2013-10-25 (×10): qty 50

## 2013-10-25 MED ORDER — ONDANSETRON HCL 4 MG/2ML IJ SOLN
4.0000 mg | Freq: Once | INTRAMUSCULAR | Status: AC
  Administered 2013-10-25: 4 mg via INTRAVENOUS

## 2013-10-25 MED ORDER — VANCOMYCIN HCL IN DEXTROSE 1-5 GM/200ML-% IV SOLN
1000.0000 mg | Freq: Once | INTRAVENOUS | Status: AC
  Administered 2013-10-25: 1000 mg via INTRAVENOUS
  Filled 2013-10-25: qty 200

## 2013-10-25 MED ORDER — SODIUM CHLORIDE 0.9 % IV SOLN
1.0000 g | Freq: Once | INTRAVENOUS | Status: AC
  Administered 2013-10-25: 1 g via INTRAVENOUS
  Filled 2013-10-25: qty 10

## 2013-10-25 MED ORDER — FLUCONAZOLE IN SODIUM CHLORIDE 200-0.9 MG/100ML-% IV SOLN
200.0000 mg | INTRAVENOUS | Status: DC
  Administered 2013-10-25 – 2013-10-26 (×2): 200 mg via INTRAVENOUS
  Filled 2013-10-25 (×4): qty 100

## 2013-10-25 MED ORDER — LEVOTHYROXINE SODIUM 125 MCG PO TABS
125.0000 ug | ORAL_TABLET | Freq: Every day | ORAL | Status: DC
Start: 2013-10-25 — End: 2013-10-29
  Administered 2013-10-26 – 2013-10-29 (×4): 125 ug via ORAL
  Filled 2013-10-25 (×7): qty 1

## 2013-10-25 MED ORDER — CLOPIDOGREL BISULFATE 75 MG PO TABS
75.0000 mg | ORAL_TABLET | Freq: Every day | ORAL | Status: DC
  Administered 2013-10-26 – 2013-10-29 (×4): 75 mg via ORAL
  Filled 2013-10-25 (×6): qty 1

## 2013-10-25 NOTE — ED Notes (Signed)
Dr. Nanavati at bedside 

## 2013-10-25 NOTE — Progress Notes (Addendum)
Interval Progress Note  Phosphate low. Potassium trending down. Will give 20 mmol Potassium Phosphate repletion. Will continue to monitor BMET and Phos.  Tommi Rumps, MD [place] PGY-2

## 2013-10-25 NOTE — ED Notes (Signed)
Patient status unchanged at this time. Kussmaul respirations- rate of 17. Lungs clear in all fields.  Normal Sinus rhythm rate of 89 BP remains low- all MDs including Nanavati and admitting are aware.  Bear hugger in place- temp of 34.9 at this time.  O2 @ 100% on RA.

## 2013-10-25 NOTE — ED Notes (Signed)
Patient presents to ED via [place]. Pt husband called EMS due to patient, "not acting like herself." Hx of diabetes. Husband checked CBG at home and it read "high." Upon EMS arrival patient VSS, but will not answer any questions- pt presents to ED the same way. EMS gave pt 1 liter of NS. 20 g to left AC placed by EMS. No acute distress noted at this time.

## 2013-10-25 NOTE — Progress Notes (Signed)
UR Completed.  Chanti Golubski Jane [phone] 10/25/2013  

## 2013-10-25 NOTE — ED Notes (Signed)
Lactic Acid reported to Dr.Nanavati

## 2013-10-25 NOTE — Progress Notes (Signed)
ABG results given to Dr. Caryl Bis.

## 2013-10-25 NOTE — ED Notes (Signed)
MD at bedside. 

## 2013-10-25 NOTE — Progress Notes (Signed)
ANTIBIOTIC CONSULT NOTE - INITIAL  Pharmacy Consult for Zosyn Indication: groin erythema/?cellulitis  Allergies  Allergen Reactions  . Sulfa Antibiotics Anaphylaxis and Swelling  . Sulfa Antibiotics     Difficulty breathing, stiff neck    Patient Measurements: Height: 5\' 7"  (170.2 cm) Weight: 189 lb 11.2 oz (86.047 kg) IBW/kg (Calculated) : 61.6   Vital Signs: Temp: 98.6 F (37 C) (02/16 1200) Temp src: Core (Comment) (02/16 0800) BP: 110/90 mmHg (02/16 1200) Pulse Rate: 100 (02/16 1200) Intake/Output from previous day: 02/15 0701 - 02/16 0700 In: 6000 [I.V.:6000] Out: 1200 [Urine:1200] Intake/Output from this shift: Total I/O In: -  Out: 2020 [Urine:2020]  Labs:  Recent Labs  10/25/13 0127 10/25/13 0221 10/25/13 0400 10/25/13 0405  10/25/13 0707 10/25/13 0907 10/25/13 1259  WBC 16.1*  --  13.8*  --   --   --   --   --   HGB 11.0* 13.6 9.5* 11.6*  --   --   --   --   PLT 342  --  265  --   --   --   --   --   CREATININE 1.46* 1.50* 1.35* 1.30*  < > 1.29* 1.32* 1.27*  < > = values in this interval not displayed. Estimated Creatinine Clearance: 51.1 ml/min (by C-G formula based on Cr of 1.27). No results found for this basename: VANCOTROUGH, Corlis Leak, VANCORANDOM, GENTTROUGH, GENTPEAK, GENTRANDOM, TOBRATROUGH, TOBRAPEAK, TOBRARND, AMIKACINPEAK, AMIKACINTROU, AMIKACIN,  in the last 72 hours   Microbiology: Recent Results (from the past 720 hour(s))  URINE CULTURE     Status: None   Collection Time    09/27/13 10:58 AM      Result Value Ref Range Status   Urine Culture, Routine Final report   Final   Result 1 Comment   Final   Comment: Mixed urogenital flora     Less than 10,000 colonies/mL  MRSA PCR SCREENING     Status: None   Collection Time    10/25/13  8:02 AM      Result Value Ref Range Status   MRSA by PCR NEGATIVE  NEGATIVE Final   Comment:            The GeneXpert MRSA Assay (FDA     approved for NASAL specimens     only), is one component  of a     comprehensive MRSA colonization     surveillance program. It is not     intended to diagnose MRSA     infection nor to guide or     monitor treatment for     MRSA infections.    Medical History: Past Medical History  Diagnosis Date  . Diabetes mellitus     on insulin, with h/o DKA   . HTN (hypertension)   . Hyperlipidemia   . Venous stasis ulcers   . Angina   . Arthritis   . Anxiety   . Depression   . 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 Heart murmur     Dr Haroldine Laws cardiologist  . Diabetes mellitus without complication   . Hypertension   . Myocardial infarction   . Coronary artery disease   . Pneumonia   . Anginal pain   . GERD (gastroesophageal reflux disease)   . Hypothyroidism   . CHF (congestive heart failure)   . H/O hiatal hernia   . Heart failure   . Tuberculosis   . Osteoporosis  Assessment: 69 YOF admitted with DKA. Received vancomycin and Zosyn in the ED. Now to continue Zosyn per pharmacy and Diflucan per MD. SCr 1.32, CrCl ~58mL/min. WBC 13.8, afebrile. Urine and blood cultures sent  Goal of Therapy:  Eradication of infection   Plan:  1. Zosyn 3.375gm IV q8h EI 2. Diflucan 200mg  IV q24h per MD 3. F/u c/s, renal function, clinical progression, LOT  Anthea Udovich D. Mychal Durio, PharmD, BCPS Clinical Pharmacist Pager: [phone] 10/25/2013 3:27 PM

## 2013-10-25 NOTE — ED Notes (Addendum)
Patient status unchanged at this time. Kussmaul respirations- rate of 17. Lungs clear in all fields.  Sinus tach rate of 102 BP remains low- all MDs including Nanavati and admitting are aware.  Bear hugger in place- temp of 34.6 at this time.  O2 @ 100% on RA.

## 2013-10-25 NOTE — H&P (Signed)
Name: Bridget Martinez MRN: [id number] DOB: [birth year]    ADMISSION DATE:  10/25/2013  PRIMARY SERVICE: PCCM  CHIEF COMPLAINT:  DKA  BRIEF PATIENT DESCRIPTION:  63 years old female with PMH relevant for CAD, post MI, HTN, dyslipidemia,  IDDM with history of DKA. Presents with one day history of severe nausea, vomiting and altered mental status. At admission to the ED found to be in severe DKA.   SIGNIFICANT EVENTS / STUDIES:  - CT head with no acute intracranial process. - Chest X ray with no evidence of parenchymal lung disease.   LINES / TUBES: - 3 peripheral IV's - Foley catheter  CULTURES: - Blood cultures sent - Urine culture  ANTIBIOTICS: - Zosyn - Vancomycin  HISTORY OF PRESENT ILLNESS:   63 years old female with PMH relevant for CAD, post MI, HTN, dyslipidemia,  IDDM with history of DKA. Presents with one day history of severe nausea, vomiting and altered mental status. As per the husband she has been sick for the last two to three weeks with a cold and diarrhea but this symptoms were getting better over the last few days. She did not complain of chest pain. She has history of recurrent UTI's that puts her into DKA. At admission to the ED found to be in severe DKA with VBG 6.06/01/44/4.9. Initial K of 7. She is also hypothermic with elevated WBC and mildly elevated lactate. No clear source of infection on physical exam or history. Initial troponin is negative. EKG with LBBB but she has history of previous intermittent LBBB.  At the time of my exam the patient is lethargic but arousable. Able to protect her airway. BP is borderline low.   PAST MEDICAL HISTORY :  Past Medical History  Diagnosis Date  . Diabetes mellitus     on insulin, with h/o DKA   . HTN (hypertension)   . Hyperlipidemia   . Venous stasis ulcers   . Angina   . Arthritis   . Anxiety   . Depression   . 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 Heart murmur      Dr Haroldine Laws cardiologist  . Diabetes mellitus without complication   . Hypertension   . Myocardial infarction   . Coronary artery disease   . Pneumonia   . Anginal pain   . GERD (gastroesophageal reflux disease)   . Hypothyroidism   . CHF (congestive heart failure)   . H/O hiatal hernia   . Heart failure   . Tuberculosis   . Osteoporosis    Past Surgical History  Procedure Laterality Date  . Cardiac catheterization    . Tonsillectomy    . Coronary artery bypass graft      Dr. Prescott Gum in 09/2011   . Coronary artery bypass graft  2012   Prior to Admission medications   Medication Sig Start Date End Date Taking? Authorizing Provider  alendronate (FOSAMAX) 70 MG tablet Take 1 tablet (70 mg total) by mouth once a week. 09/27/13   Lysbeth Penner, FNP  aspirin EC 325 MG tablet Take 325 mg by mouth daily.    Historical Provider, MD  carvedilol (COREG) 12.5 MG tablet Take 12.5 mg by mouth 2 (two) times daily with a meal.    Historical Provider, MD  clopidogrel (PLAVIX) 75 MG tablet Take 75 mg by mouth daily with breakfast.    Historical Provider, MD  enalapril (VASOTEC) 2.5 MG tablet Take  1 tablet (2.5 mg total) by mouth 2 (two) times daily. 10/13/13   Jolaine Artist, MD  escitalopram (LEXAPRO) 10 MG tablet Take 1 tablet (10 mg total) by mouth daily. 09/27/13   Lysbeth Penner, FNP  furosemide (LASIX) 20 MG tablet Take 20 mg by mouth daily.     Historical Provider, MD  insulin aspart (NOVOLOG) 100 UNIT/ML injection Inject 100 Units into the skin 3 (three) times daily with meals. 14 units at breakfast, 12 units at lunch, 7 units supper Plus 1 unit for every 35mg /dL above 100 mg/dL    Historical Provider, MD  insulin detemir (LEVEMIR) 100 UNIT/ML injection Inject 20 Units into the skin 2 (two) times daily.    Historical Provider, MD  insulin lispro (HUMALOG) 100 UNIT/ML injection Inject 5-8 Units into the skin 3 (three) times daily before meals. Per sliding scale    Historical  Provider, MD  levothyroxine (SYNTHROID, LEVOTHROID) 125 MCG tablet Take 1 tablet (125 mcg total) by mouth daily. 09/27/13   Lysbeth Penner, FNP  pantoprazole (PROTONIX) 40 MG tablet Take 40 mg by mouth daily. 11/26/11 12/31/13  Jolaine Artist, MD  potassium chloride (K-DUR) 10 MEQ tablet Take 1 tablet (10 mEq total) by mouth daily. 07/14/12   Wynetta Emery, PA-C  rosuvastatin (CRESTOR) 40 MG tablet Take 1 tablet (40 mg total) by mouth daily. 09/27/13   Lysbeth Penner, FNP  spironolactone (ALDACTONE) 25 MG tablet TAKE 1/2 TABLET DAILY 10/07/13   Jolaine Artist, MD  Vitamin D, Ergocalciferol, (DRISDOL) 50000 UNITS CAPS capsule Take 1 capsule (50,000 Units total) by mouth every 7 (seven) days. 09/29/13   Lysbeth Penner, FNP   Allergies  Allergen Reactions  . Sulfa Antibiotics Anaphylaxis and Swelling  . Sulfa Antibiotics     Difficulty breathing, stiff neck    FAMILY HISTORY:  Family History  Problem Relation Age of Onset  . Heart disease Father   . Multiple sclerosis Father   . Hypertension Mother   . Hyperthyroidism Mother   . Diabetes Cousin     Multiple maternal cousins with type 2 diabetes mellitus  . Diabetes Maternal Uncle     Type 1 diabetes mellitus  . Heart attack Paternal Grandfather   . Heart disease Paternal Grandfather    SOCIAL HISTORY:  reports that she has never smoked. She does not have any smokeless tobacco history on file. She reports that she does not drink alcohol or use illicit drugs.  REVIEW OF SYSTEMS:  Unable to provide due to AMS  SUBJECTIVE:   VITAL SIGNS: Temp:  [94.5 F (34.7 C)] 94.5 F (34.7 C) (02/16 0230) Pulse Rate:  [78-89] 85 (02/16 0315) Resp:  [19-28] 19 (02/16 0315) BP: (84-104)/(26-85) 84/32 mmHg (02/16 0315) SpO2:  [100 %] 100 % (02/16 0315) HEMODYNAMICS:   VENTILATOR SETTINGS:   INTAKE / OUTPUT: Intake/Output     02/15 0701 - 02/16 0700   I.V. 2000   Total Intake 2000   Net +2000         PHYSICAL  EXAMINATION: General: Pleasant female patient in mild respiratory distress.  Eyes: Anicteric sclerae. ENT: Oropharynx clear. Dry mucous membranes. No thrush Lymph: No cervical, supraclavicular, or axillary lymphadenopathy. Heart: Normal S1, S2. No murmurs, rubs, or gallops appreciated. No bruits, equal pulses. Lungs: Increased work of breathing (Kussmaul breathing). Good air movement bilaterally, without wheezes or crackles. Normal upper airway sounds without evidence of stridor. Abdomen: Abdomen soft, non-tender and not distended, normoactive bowel sounds. No  hepatosplenomegaly or masses. Musculoskeletal: No clubbing or synovitis. Skin: Genital area and groin erythema likely secondary to fungal infection. No crepitus or evidence of gangrene,   Neuro: Lethargic, arousable. Able to protect her airway. No focal neurologic deficits.   LABS:  CBC  Recent Labs Lab 10/25/13 0127 10/25/13 0221  WBC 16.1*  --   HGB 11.0* 13.6  HCT 35.3* 40.0  PLT 342  --    Coag's No results found for this basename: APTT, INR,  in the last 168 hours BMET  Recent Labs Lab 10/25/13 0127 10/25/13 0221  NA 129* 129*  K 7.3* 7.6*  CL 85* 102  CO2 <7*  --   BUN 36* 55*  CREATININE 1.46* 1.50*  GLUCOSE 930* >700*   Electrolytes  Recent Labs Lab 10/25/13 0127  CALCIUM 8.6   Sepsis Markers  Recent Labs Lab 10/25/13 0132  LATICACIDVEN 1.83   ABG No results found for this basename: PHART, PCO2ART, PO2ART,  in the last 168 hours Liver Enzymes No results found for this basename: AST, ALT, ALKPHOS, BILITOT, ALBUMIN,  in the last 168 hours Cardiac Enzymes  Recent Labs Lab 10/25/13 0127  TROPONINI <0.30   Glucose  Recent Labs Lab 10/25/13 0122  GLUCAP >600*    Imaging Dg Chest Port 1 View  10/25/2013   CLINICAL DATA:  Dyspnea, cough  EXAM: PORTABLE CHEST - 1 VIEW  COMPARISON:  DG CHEST 2 VIEW dated 05/23/2012  FINDINGS: Sternotomy wires overlie normal cardiac silhouette. No  effusion, infiltrate, or pneumothorax. No osseous abnormality.  IMPRESSION: No acute cardiopulmonary process.   Electronically Signed   By: Suzy Bouchard M.D.   On: 10/25/2013 01:54     CXR:  - No parenchymal lung disease.   ASSESSMENT / PLAN:  PULMONARY A: 1) Kussmaul breathing due to DKA. Able to protect her airway. P:   - Supplemental O2 to keep O2 sat >94%  CARDIOVASCULAR A:  1) Hypotension likely secondary to severe volume depletion vs sepsis 2) Echocardiogram from January with LVEF of 30 to 35% 3) HTN 4) Negative troponin at admission.  5) LBBB. Has a previous EKG with LBBB but most recent one did not have it. P:  - IVF resuscitation per DKA protocol  - Watch for volume overload / pulmonary edema - Will hold antihypertensives - Hold lasix and spironolactone - Serial troponin x 3 - Continue Plavix and aspirin in am  - Norepinephrine drip to keep MAP > 65  RENAL A:   1) Acute renal failure, likely pre renal.  2) Hyponatremia 3) Hyperkalemia P:   - IVF resuscitation per DKA protocol with NS - Got calcium gluconate in the ED. On insulin drip. Got also 1 amp of bicarb.  - Will follow chemistry q2 hrs per DKA protocol.   GASTROINTESTINAL A:   1) Nausea and vomiting 2) GERD P:   - Protonix IV   HEMATOLOGIC A:   1) No issues P:  - Will follow CBC  INFECTIOUS A:   1) No clear source of infection. Does have an elevated WBC and is hypothermic.  2) Groin and genital area erythema. No crepitus. Still important to rule out deep soft tissue infection.  P:   - Will cover empirically with Zosyn and Vancomycin - Will get non contrast CT of the pelvis - Will follow cultures.   ENDOCRINE A:   1) DKA 2) Hypothyroidism  P:   -DKA protocol -isotonic saline until euvolemic -insulin gtt per protocol,  transition to sq when  anion gap is normal, bicarb is >18  -transition to D51/2 NS after blood glucose is <250, transition to NSL when able to take pos.  -close  observation of K, replace as indicated.  -close observation of PO4 and replace as indicated - Will start bicarb drip since pH 6.9 - Resume synthroid in am   NEUROLOGIC A:   1) Altered mental status, likely metabolic encephalopathy.  2) CT scan of the head with no acute intracranial process P:   - Close monitoring of mental status.   TODAY'S SUMMARY:   I have personally obtained a history, examined the patient, evaluated laboratory and imaging results, formulated the assessment and plan and placed orders. CRITICAL CARE: The patient is critically ill with multiple organ systems failure and requires high complexity decision making for assessment and support, frequent evaluation and titration of therapies, application of advanced monitoring technologies and extensive interpretation of multiple databases. Critical Care Time devoted to patient care services described in this note is 60 minutes.   Waynetta Pean, MD Pulmonary and [place] Pager: [phone]  10/25/2013, 3:30 AM

## 2013-10-25 NOTE — H&P (Signed)
Name: Bridget Martinez MRN: [id number] DOB: 07-04-1951    ADMISSION DATE:  10/25/2013  PRIMARY SERVICE: PCCM  CHIEF COMPLAINT:  DKA  BRIEF PATIENT DESCRIPTION:  63 years old female with PMH relevant for CAD, post MI, HTN, dyslipidemia,  IDDM with history of DKA. Presents with one day history of severe nausea, vomiting and altered mental status. At admission to the ED found to be in severe DKA.   SIGNIFICANT EVENTS / STUDIES:  - CT head with no acute intracranial process. - Chest X ray with no evidence of parenchymal lung disease.   LINES / TUBES: - 3 peripheral IV's - Foley catheter  CULTURES: - Blood cultures sent - Urine culture  ANTIBIOTICS: - Zosyn - Vancomycin  SUBJECTIVE: remains on  Insulin drip  VITAL SIGNS: Temp:  [94.5 F (34.7 C)-98.9 F (37.2 C)] 98.6 F (37 C) (02/16 1200) Pulse Rate:  [78-106] 100 (02/16 1200) Resp:  [16-28] 21 (02/16 1200) BP: (81-117)/(26-90) 110/90 mmHg (02/16 1200) SpO2:  [97 %-100 %] 99 % (02/16 1200) Weight:  [86.047 kg (189 lb 11.2 oz)] 86.047 kg (189 lb 11.2 oz) (02/16 0800) HEMODYNAMICS:   VENTILATOR SETTINGS:   INTAKE / OUTPUT: Intake/Output     02/15 0701 - 02/16 0700 02/16 0701 - 02/17 0700   I.V. (mL/kg) 6000    Total Intake(mL/kg) 6000    Urine (mL/kg/hr) 1200 2020 (3.1)   Total Output 1200 2020   Net +4800 -2020          PHYSICAL EXAMINATION: General: lethargic Neuro: follows commands, Oriented slowly HEENT: jvd flat PULM:  CTA, reduced CV: s1 s2 RRR no r GI: soft, BS wnl no r/g Extremities: gen edema, not pitting    LABS:  CBC  Recent Labs Lab 10/25/13 0127 10/25/13 0221 10/25/13 0400 10/25/13 0405  WBC 16.1*  --  13.8*  --   HGB 11.0* 13.6 9.5* 11.6*  HCT 35.3* 40.0 30.3* 34.0*  PLT 342  --  265  --    Coag's  Recent Labs Lab 10/25/13 0455  APTT 20*  INR 1.42   BMET  Recent Labs Lab 10/25/13 0614 10/25/13 0707 10/25/13 0907  NA 142 144 148*  K 4.2 4.2 4.2  CL 109 112  116*  CO2 <7* <7* 9*  BUN 29* 28* 27*  CREATININE 1.20* 1.29* 1.32*  GLUCOSE 415* 267* 148*   Electrolytes  Recent Labs Lab 10/25/13 0614 10/25/13 0707 10/25/13 0907  CALCIUM 6.5* 6.6* 6.9*   Sepsis Markers  Recent Labs Lab 10/25/13 0132  LATICACIDVEN 1.83   ABG No results found for this basename: PHART, PCO2ART, PO2ART,  in the last 168 hours Liver Enzymes No results found for this basename: AST, ALT, ALKPHOS, BILITOT, ALBUMIN,  in the last 168 hours Cardiac Enzymes  Recent Labs Lab 10/25/13 0127  TROPONINI <0.30   Glucose  Recent Labs Lab 10/25/13 0122 10/25/13 0332 10/25/13 0440 10/25/13 0546 10/25/13 0649 10/25/13 0811  GLUCAP >600* 573* 516* 355* 311* 252*    Imaging Ct Abdomen Pelvis Wo Contrast  10/25/2013   CLINICAL DATA:  Nausea, vomiting, DKA  EXAM: CT ABDOMEN AND PELVIS WITHOUT CONTRAST  TECHNIQUE: Multidetector CT imaging of the abdomen and pelvis was performed following the standard protocol without intravenous contrast.  COMPARISON:  Prior ultrasound from 05/23/2012  FINDINGS: The study is degraded by motion artifact.  The visualized lung bases are grossly clear.  Limited noncontrast evaluation of the liver is unremarkable. Gallbladder is within normal limits. No biliary ductal  dilatation. The spleen, adrenal glands, and pancreas demonstrate a normal unenhanced appearance.  The kidneys are equal in size without evidence of nephrolithiasis or hydronephrosis.  The bowels are grossly unremarkable without evidence of obstruction or inflammatory changes. Scattered colonic diverticula are present without acute diverticulitis. Appendix is visualized within the right lower quadrant and is of normal caliber without definite inflammatory changes.  Bladder is decompressed with a Foley catheter in place. Gas lucency within the bladder likely related to catheterization. The uterus and ovaries are within normal limits for patient age.  No free air or fluid identified.  No enlarged intra-abdominal pelvic lymph nodes. There is asymmetric mild inflammatory soft tissue stranding within the subcutaneous fat in the left inguinal region adjacent to the left femoral artery (series 2, image 88). Finding is of uncertain significance. No loculated collection.  Moderate atherosclerotic disease seen within the aorta and bilateral iliac arteries.  No acute osseous abnormality identified. Multilevel degenerative changes noted within the visualized spine. No worrisome lytic or blastic osseous lesions.  IMPRESSION: 1. No CT evidence of acute intra-abdominal or pelvic process. 2. Asymmetric soft tissue stranding within the subcutaneous fat of the left inguinal region adjacent to the left femoral artery. This finding is of uncertain clinical significance, and may be 3. Colonic diverticulosis without acute diverticulitis. 4. Moderate aorto bi-iliac atherosclerotic disease.   Electronically Signed   By: Jeannine Boga M.D.   On: 10/25/2013 05:42   Ct Head Wo Contrast  10/25/2013   CLINICAL DATA:  Altered mental status  EXAM: CT HEAD WITHOUT CONTRAST  TECHNIQUE: Contiguous axial images were obtained from the base of the skull through the vertex without intravenous contrast.  COMPARISON:  Prior CT from 05/26/2007  FINDINGS: There is no acute intracranial hemorrhage or infarct. No mass lesion or midline shift. Gray-white matter differentiation is well maintained. Ventricles are normal in size without evidence of hydrocephalus. Cerebral volume within normal limits for patient age. Mild hypoattenuation within the periventricular white matter likely represents very mild chronic microvascular ischemic changes. No extra-axial fluid collection.  The calvarium is intact. Prominent calcifications subjacent to the inner table of the left frontal calvarium is unchanged.  Orbital soft tissues are within normal limits.  The paranasal sinuses and mastoid air cells are well pneumatized and free of fluid.   Scalp soft tissues are unremarkable.  IMPRESSION: No acute intracranial process.   Electronically Signed   By: Jeannine Boga M.D.   On: 10/25/2013 03:32   Dg Chest Portable 1 View  10/25/2013   CLINICAL DATA:  Hyperglycemic, altered mental status  EXAM: PORTABLE CHEST - 1 VIEW  COMPARISON:  DG CHEST 1V PORT dated 10/25/2013; DG CHEST 2 VIEW dated 05/23/2012; DG CHEST 1V PORT dated 11/14/2011  FINDINGS: Heart size upper normal to mildly enlarged but stable. Patient is status post CABG. Vascular pattern within normal limits. There is no consolidation effusion or pulmonary edema.  IMPRESSION: No acute findings.   Electronically Signed   By: Skipper Cliche M.D.   On: 10/25/2013 07:23   Dg Chest Port 1 View  10/25/2013   CLINICAL DATA:  Dyspnea, cough  EXAM: PORTABLE CHEST - 1 VIEW  COMPARISON:  DG CHEST 2 VIEW dated 05/23/2012  FINDINGS: Sternotomy wires overlie normal cardiac silhouette. No effusion, infiltrate, or pneumothorax. No osseous abnormality.  IMPRESSION: No acute cardiopulmonary process.   Electronically Signed   By: Suzy Bouchard M.D.   On: 10/25/2013 01:54     CXR:  - No parenchymal lung disease.  ASSESSMENT / PLAN:  PULMONARY A: 1) Kussmaul breathing due to DKA. Able to protect her airway, uncompensated met acidosis P:   - Supplemental O2 to keep O2 sat >94% -repeat abg -pcxr in am for volume status  CARDIOVASCULAR A:  1) Hypotension likely secondary to severe volume depletion vs sepsis 2) Echocardiogram from January with LVEF of 30 to 35% 3) HTN 4) Negative troponin at admission.  5) LBBB. Has a previous EKG with LBBB but most recent one did not have it. P:  - IVF resuscitation per DKA protocol  - Watch for volume overload / pulmonary edema with pcxr - Continue Plavix and aspirin in am - Norepinephrine drip not required -cortisol if drops BP further -may need cvp -dc groin LINE ASAP , once off protocol -no cvp with groin line  RENAL A:   1) Acute renal  failure, likely pre renal.  2) Hyponatremia 3) Hyperkalemia 4) Met acid AG 5) small NON AG acidosis, r/o secondary to saline vs rta 4 P:   -No role bicarb, unless NON GA gets worse -avoid saline -bmet q4h -repeat lactic acid -abg to follow  GASTROINTESTINAL A:   1) Nausea and vomiting 2) GERD P:   - Protonix IV  -maintain NPO  HEMATOLOGIC A:   1) DB Tprevention P:  - Will follow CBC -sub q hep  INFECTIOUS A:   1) No clear source of infection. Does have an elevated WBC and is hypothermic.  2) Groin erythema, r/o contact related, fungal? cellultitis P:   - Will cover empirically with Zosyn and Vancomycin - CT of the pelvis neg - Will follow cultures.  -add diflucan  ENDOCRINE A:   1) DKA 2) Hypothyroidism  P:   -DKA protocol continue to avoid saline -insulin gtt per protocol -transition to D51/2 NS , done -assess mag, phos - no role bicarb, repeat abg - Resume synthroid in am -tsh  NEUROLOGIC A:   1) Altered mental status, likely metabolic encephalopathy.  2) CT scan of the head with no acute intracranial process P:   - Close monitoring of mental status.   TODAY'S SUMMARY: continue insulin drip, remain critical, add diflucan, follow cultures, ct neg  I have personally obtained a history, examined the patient, evaluated laboratory and imaging results, formulated the assessment and plan and placed orders. CRITICAL CARE: The patient is critically ill with multiple organ systems failure and requires high complexity decision making for assessment and support, frequent evaluation and titration of therapies, application of advanced monitoring technologies and extensive interpretation of multiple databases. Critical Care Time devoted to patient care services described in this note is 30 minutes.   [name]. Titus Mould, MD, [place] Pgr: [facility]  10/25/2013, 2:33 PM

## 2013-10-25 NOTE — ED Notes (Signed)
Rectal temp 94.5. Dr. Kathrynn Humble made aware. Verbal order to place bear hugger on patient and to insert a temp foley.

## 2013-10-25 NOTE — ED Provider Notes (Signed)
CSN: [id number]     Arrival date & time 10/25/13  0118 History   First MD Initiated Contact with Patient 10/25/13 0122     Chief Complaint  Patient presents with  . Hyperglycemia  . Altered Mental Status     (Consider location/radiation/quality/duration/timing/severity/associated sxs/prior Treatment) HPI Comments: LEVEL 5 CAVEAT FOR ALTERED MENTAL STATUS  Pt with hx of DM, CAD, CHF comes in with cc of altered mental status. Per husband, pt has been having some emesis - otherwise, was doing fine, and has been taking her meds as prescribed. He last saw her normal at 9 pm, when pt was on a recliner, and went on to take a nap himself. At 11 pm - when he talked to her, she sounded incoherent, and so he called EMS.  Pt arrives with GCS - 6 (evm = 1/1/4), but has a gag reflex. She is hypotensive, tachycardic, hypothermic has kussmaul's respirations.  Patient is a 63 y.o. female presenting with hyperglycemia and altered mental status. The history is provided by medical records and the spouse.  Hyperglycemia Associated symptoms: altered mental status   Altered Mental Status   Past Medical History  Diagnosis Date  . Diabetes mellitus     on insulin, with h/o DKA   . HTN (hypertension)   . Hyperlipidemia   . Venous stasis ulcers   . Angina   . Arthritis   . Anxiety   . Depression   . 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 Heart murmur     Dr Haroldine Laws cardiologist  . Diabetes mellitus without complication   . Hypertension   . Myocardial infarction   . Coronary artery disease   . Pneumonia   . Anginal pain   . GERD (gastroesophageal reflux disease)   . Hypothyroidism   . CHF (congestive heart failure)   . H/O hiatal hernia   . Heart failure   . Tuberculosis   . Osteoporosis    Past Surgical History  Procedure Laterality Date  . Cardiac catheterization    . Tonsillectomy    . Coronary artery bypass graft      Dr. Prescott Gum in  09/2011   . Coronary artery bypass graft  2012   Family History  Problem Relation Age of Onset  . Heart disease Father   . Multiple sclerosis Father   . Hypertension Mother   . Hyperthyroidism Mother   . Diabetes Cousin     Multiple maternal cousins with type 2 diabetes mellitus  . Diabetes Maternal Uncle     Type 1 diabetes mellitus  . Heart attack Paternal Grandfather   . Heart disease Paternal Grandfather    History  Substance Use Topics  . Smoking status: Never Smoker   . Smokeless tobacco: Not on file  . Alcohol Use: No   OB History   Grav Para Term Preterm Abortions TAB SAB Ect Mult Living                 Review of Systems  Unable to perform ROS: Mental status change      Allergies  Sulfa antibiotics and Sulfa antibiotics  Home Medications   Current Outpatient Rx  Name  Route  Sig  Dispense  Refill  . aspirin EC 325 MG tablet   Oral   Take 325 mg by mouth daily.         . carvedilol (COREG) 12.5 MG tablet   Oral   Take  12.5 mg by mouth 2 (two) times daily with a meal.         . clopidogrel (PLAVIX) 75 MG tablet   Oral   Take 75 mg by mouth daily with breakfast.         . enalapril (VASOTEC) 2.5 MG tablet   Oral   Take 1 tablet (2.5 mg total) by mouth 2 (two) times daily.   60 tablet   6   . escitalopram (LEXAPRO) 10 MG tablet   Oral   Take 1 tablet (10 mg total) by mouth daily.   30 tablet   11   . furosemide (LASIX) 20 MG tablet   Oral   Take 20 mg by mouth daily.          . insulin aspart (NOVOLOG) 100 UNIT/ML injection   Subcutaneous   Inject 100 Units into the skin 3 (three) times daily with meals. 14 units at breakfast, 12 units at lunch, 7 units supper Plus 1 unit for every 35mg /dL above 100 mg/dL         . insulin detemir (LEVEMIR) 100 UNIT/ML injection   Subcutaneous   Inject 20 Units into the skin 2 (two) times daily.         . insulin lispro (HUMALOG) 100 UNIT/ML injection   Subcutaneous   Inject 5-8 Units into  the skin 3 (three) times daily before meals. Per sliding scale         . levothyroxine (SYNTHROID, LEVOTHROID) 125 MCG tablet   Oral   Take 1 tablet (125 mcg total) by mouth daily.   30 tablet   11     NTBS   . pantoprazole (PROTONIX) 40 MG tablet   Oral   Take 40 mg by mouth daily.         . potassium chloride (K-DUR) 10 MEQ tablet   Oral   Take 1 tablet (10 mEq total) by mouth daily.   30 tablet   3   . rosuvastatin (CRESTOR) 40 MG tablet   Oral   Take 1 tablet (40 mg total) by mouth daily.   30 tablet   11   . spironolactone (ALDACTONE) 25 MG tablet   Oral   Take 12.5 mg by mouth daily.         Marland Kitchen alendronate (FOSAMAX) 70 MG tablet   Oral   Take 1 tablet (70 mg total) by mouth once a week.   4 tablet   11   . Vitamin D, Ergocalciferol, (DRISDOL) 50000 UNITS CAPS capsule   Oral   Take 1 capsule (50,000 Units total) by mouth every 7 (seven) days.   18 capsule   0    BP 81/32  Pulse 96  Temp(Src) 96.3 F (35.7 C) (Core (Comment))  Resp 20  SpO2 97% Physical Exam  Nursing note and vitals reviewed. Constitutional: She appears well-developed.  HENT:  Head: Atraumatic.  Eyes: Conjunctivae are normal.  Neck: Neck supple. No JVD present.  Cardiovascular:  tachycardia  Pulmonary/Chest:  tachypneic - in the mid 20s  Abdominal: Soft.  Genitourinary:  Erythema around there perineum - no crepitus, no satellite lesions  Neurological:  gcs - 6    ED Course  Procedures (including critical care time) Labs Review Labs Reviewed  CBC WITH DIFFERENTIAL - Abnormal; Notable for the following:    WBC 16.1 (*)    RBC 3.67 (*)    Hemoglobin 11.0 (*)    HCT 35.3 (*)    Neutrophils Relative %  86 (*)    Lymphocytes Relative 8 (*)    Neutro Abs 13.8 (*)    All other components within normal limits  BASIC METABOLIC PANEL - Abnormal; Notable for the following:    Sodium 129 (*)    Potassium 7.3 (*)    Chloride 85 (*)    CO2 <7 (*)    Glucose, Bld 930 (*)     BUN 36 (*)    Creatinine, Ser 1.46 (*)    GFR calc non Af Amer 37 (*)    GFR calc Af Amer 43 (*)    All other components within normal limits  URINALYSIS, ROUTINE W REFLEX MICROSCOPIC - Abnormal; Notable for the following:    APPearance CLOUDY (*)    Glucose, UA >1000 (*)    Ketones, ur >80 (*)    All other components within normal limits  GLUCOSE, CAPILLARY - Abnormal; Notable for the following:    Glucose-Capillary >600 (*)    All other components within normal limits  BASIC METABOLIC PANEL - Abnormal; Notable for the following:    Sodium 135 (*)    Potassium 5.7 (*)    CO2 <7 (*)    Glucose, Bld 711 (*)    BUN 33 (*)    Creatinine, Ser 1.35 (*)    Calcium 7.5 (*)    GFR calc non Af Amer 41 (*)    GFR calc Af Amer 47 (*)    All other components within normal limits  CBC - Abnormal; Notable for the following:    WBC 13.8 (*)    RBC 3.20 (*)    Hemoglobin 9.5 (*)    HCT 30.3 (*)    All other components within normal limits  GLUCOSE, CAPILLARY - Abnormal; Notable for the following:    Glucose-Capillary 573 (*)    All other components within normal limits  APTT - Abnormal; Notable for the following:    aPTT 20 (*)    All other components within normal limits  PROTIME-INR - Abnormal; Notable for the following:    Prothrombin Time 17.0 (*)    All other components within normal limits  GLUCOSE, CAPILLARY - Abnormal; Notable for the following:    Glucose-Capillary 516 (*)    All other components within normal limits  GLUCOSE, CAPILLARY - Abnormal; Notable for the following:    Glucose-Capillary 355 (*)    All other components within normal limits  POCT I-STAT 3, BLOOD GAS (G3P V) - Abnormal; Notable for the following:    pH, Ven 6.937 (*)    pCO2, Ven 23.0 (*)    Bicarbonate 4.9 (*)    Acid-base deficit 26.0 (*)    All other components within normal limits  POCT I-STAT, CHEM 8 - Abnormal; Notable for the following:    Sodium 129 (*)    Potassium 7.6 (*)    BUN 55  (*)    Creatinine, Ser 1.50 (*)    Glucose, Bld >700 (*)    All other components within normal limits  POCT I-STAT, CHEM 8 - Abnormal; Notable for the following:    Potassium 5.6 (*)    BUN 29 (*)    Creatinine, Ser 1.30 (*)    Glucose, Bld >700 (*)    Hemoglobin 11.6 (*)    HCT 34.0 (*)    All other components within normal limits  URINE CULTURE  CULTURE, BLOOD (ROUTINE X 2)  CULTURE, BLOOD (ROUTINE X 2)  TROPONIN I  URINE MICROSCOPIC-ADD ON  CK  BASIC METABOLIC PANEL  BASIC METABOLIC PANEL  BASIC METABOLIC PANEL  CG4 I-STAT (LACTIC ACID)   Imaging Review Ct Abdomen Pelvis Wo Contrast  10/25/2013   CLINICAL DATA:  Nausea, vomiting, DKA  EXAM: CT ABDOMEN AND PELVIS WITHOUT CONTRAST  TECHNIQUE: Multidetector CT imaging of the abdomen and pelvis was performed following the standard protocol without intravenous contrast.  COMPARISON:  Prior ultrasound from 05/23/2012  FINDINGS: The study is degraded by motion artifact.  The visualized lung bases are grossly clear.  Limited noncontrast evaluation of the liver is unremarkable. Gallbladder is within normal limits. No biliary ductal dilatation. The spleen, adrenal glands, and pancreas demonstrate a normal unenhanced appearance.  The kidneys are equal in size without evidence of nephrolithiasis or hydronephrosis.  The bowels are grossly unremarkable without evidence of obstruction or inflammatory changes. Scattered colonic diverticula are present without acute diverticulitis. Appendix is visualized within the right lower quadrant and is of normal caliber without definite inflammatory changes.  Bladder is decompressed with a Foley catheter in place. Gas lucency within the bladder likely related to catheterization. The uterus and ovaries are within normal limits for patient age.  No free air or fluid identified. No enlarged intra-abdominal pelvic lymph nodes. There is asymmetric mild inflammatory soft tissue stranding within the subcutaneous fat in  the left inguinal region adjacent to the left femoral artery (series 2, image 88). Finding is of uncertain significance. No loculated collection.  Moderate atherosclerotic disease seen within the aorta and bilateral iliac arteries.  No acute osseous abnormality identified. Multilevel degenerative changes noted within the visualized spine. No worrisome lytic or blastic osseous lesions.  IMPRESSION: 1. No CT evidence of acute intra-abdominal or pelvic process. 2. Asymmetric soft tissue stranding within the subcutaneous fat of the left inguinal region adjacent to the left femoral artery. This finding is of uncertain clinical significance, and may be 3. Colonic diverticulosis without acute diverticulitis. 4. Moderate aorto bi-iliac atherosclerotic disease.   Electronically Signed   By: Jeannine Boga M.D.   On: 10/25/2013 05:42   Ct Head Wo Contrast  10/25/2013   CLINICAL DATA:  Altered mental status  EXAM: CT HEAD WITHOUT CONTRAST  TECHNIQUE: Contiguous axial images were obtained from the base of the skull through the vertex without intravenous contrast.  COMPARISON:  Prior CT from 05/26/2007  FINDINGS: There is no acute intracranial hemorrhage or infarct. No mass lesion or midline shift. Gray-white matter differentiation is well maintained. Ventricles are normal in size without evidence of hydrocephalus. Cerebral volume within normal limits for patient age. Mild hypoattenuation within the periventricular white matter likely represents very mild chronic microvascular ischemic changes. No extra-axial fluid collection.  The calvarium is intact. Prominent calcifications subjacent to the inner table of the left frontal calvarium is unchanged.  Orbital soft tissues are within normal limits.  The paranasal sinuses and mastoid air cells are well pneumatized and free of fluid.  Scalp soft tissues are unremarkable.  IMPRESSION: No acute intracranial process.   Electronically Signed   By: Jeannine Boga M.D.   On:  10/25/2013 03:32   Dg Chest Port 1 View  10/25/2013   CLINICAL DATA:  Dyspnea, cough  EXAM: PORTABLE CHEST - 1 VIEW  COMPARISON:  DG CHEST 2 VIEW dated 05/23/2012  FINDINGS: Sternotomy wires overlie normal cardiac silhouette. No effusion, infiltrate, or pneumothorax. No osseous abnormality.  IMPRESSION: No acute cardiopulmonary process.   Electronically Signed   By: Suzy Bouchard M.D.   On: 10/25/2013 01:54    EKG Interpretation  Date/Time:  Monday October 25 2013 01:47:06 EST Ventricular Rate:  80 PR Interval:  60 QRS Duration: 154 QT Interval:  473 QTC Calculation: 546 R Axis:   -72 Text Interpretation:  Sinus rhythm Short PR interval Left bundle branch block Confirmed by Toyoko Silos, MD, Mylah Baynes ([id number]) on 10/25/2013 6:50:17 AM              MDM   Final diagnoses:  DKA (diabetic ketoacidoses)  Hyperkalemia  Metabolic acidosis  Altered mental status  Hypotension    CENTRAL LINE Performed by: Varney Biles Consent: The procedure was performed in an emergent situation. Required items: required blood products, implants, devices, and special equipment available Patient identity confirmed: arm band and provided demographic data Time out: Immediately prior to procedure a "time out" was called to verify the correct patient, procedure, equipment, support staff and site/side marked as required. Indications: vascular access Anesthesia: local infiltration Local anesthetic: lidocaine 1% with epinephrine Anesthetic total: 3 ml Patient sedated: no Preparation: skin prepped with 2% chlorhexidine Skin prep agent dried: skin prep agent completely dried prior to procedure Sterile barriers: all five maximum sterile barriers used - cap, mask, sterile gown, sterile gloves, and large sterile sheet Hand hygiene: hand hygiene performed prior to central venous catheter insertion  Location details: left femoral  Catheter type: triple lumen Catheter size: 8 Fr Pre-procedure: landmarks  identified Ultrasound guidance: No Successful placement: yes Post-procedure: line sutured and dressing applied Assessment: blood return through all parts, free fluid flow, placement verified by x-ray and no pneumothorax on x-ray Patient tolerance: Patient tolerated the procedure well with no immediate complications. WE DID HAVE TO USE 2 ATTEMPTS - the first attempted led to kinking of the catheter when advancing over the wire. The [place] was used to verify the presence of the central line - and it was in the venous system - but we were unable to advance the catheter, and decision was made to abort the procedure. Pt's foot has been assessed 2 x since then, and she has a warm foot, and it is normal in appearance.   Angiocath insertion Performed by: Varney Biles  Consent: Verbal consent obtained. Risks and benefits: risks, benefits and alternatives were discussed Time out: Immediately prior to procedure a "time out" was called to verify the correct patient, procedure, equipment, support staff and site/side marked as required.  Preparation: Patient was prepped and draped in the usual sterile fashion.  Vein Location: right EJ - 18 gauge  Gauge: 18  Normal blood return and flush without difficulty Patient tolerance: Patient tolerated the procedure well with no immediate complications.   CRITICAL CARE Performed by: Varney Biles   Total critical care time: 150 minutes  Critical care time was exclusive of separately billable procedures and treating other patients.  Critical care was necessary to treat or prevent imminent or life-threatening deterioration.  Critical care was time spent personally by me on the following activities: development of treatment plan with patient and/or surrogate as well as nursing, discussions with consultants, evaluation of patient's response to treatment, examination of patient, obtaining history from patient or surrogate, ordering and performing treatments  and interventions, ordering and review of laboratory studies, ordering and review of radiographic studies, pulse oximetry and re-evaluation of patient's condition.   Pt comes in with cc of AMS. Noted to have elevated blood sugar, tachycardia, kussmaul's respiration. Pt had 2 x 20 gauge iv access, i put in a 3rd 18 gauge EJ immediately -and started fluid hydration.  Pt was breathing in  the mid 20s, putting in good effort and had a gag, so we decided not to intubate her immediately - especially since her BP was low - and the RSI meds can potentially make her even worse.  Pt had large anion gap metabloic acidosis, with EKG showing LBBB (with worsening qrs) and some non specific changes - likely from the pseudohyperkalemia - so bicarm amp x 2 given.  CCM called - they agreed not to intubate patient post their evaluation. Pt however, had no bed in the ICU, her BP was still low - and so a central line was placed. CCM wanted to get CT abd/pelvis - to r/o free air (to ensure there was no fournier's underlying the erythema in the perineum) - which came back neg, CT head shows no acute findings either.  Pt had received 3 liters of ivf when CCM took over the care. Pt pan cultured and vanc + zosyn given. There was no bed in the ICU - so i reassessed patient at least 4 times, and informed family. She now will response to her name- but hasnt spoken yet, and falls asleep immediately.  Her gap and e'lytes have improved. Repeat CXR ordered.       Varney Biles, MD 10/25/13 [phone]

## 2013-10-25 NOTE — ED Notes (Signed)
Dr. Kathrynn Humble made aware of low blood pressures. No new verbal orders.

## 2013-10-25 NOTE — ED Notes (Signed)
Error in charting- patient has had a total of 6 L of Normal saline.

## 2013-10-25 NOTE — ED Notes (Addendum)
Dr. Presley Raddle made aware of low blood pressures. Verbal order to give 3L of NS as bolus then to reevaluate. MD at bedside. Dr. Kathrynn Humble and Dr. Presley Raddle aware that patient has already received 3 Liters. Verbal order remains the same.

## 2013-10-26 ENCOUNTER — Inpatient Hospital Stay (HOSPITAL_COMMUNITY): Payer: BC Managed Care – PPO

## 2013-10-26 DIAGNOSIS — R4182 Altered mental status, unspecified: Secondary | ICD-10-CM

## 2013-10-26 DIAGNOSIS — J9600 Acute respiratory failure, unspecified whether with hypoxia or hypercapnia: Secondary | ICD-10-CM

## 2013-10-26 DIAGNOSIS — N179 Acute kidney failure, unspecified: Secondary | ICD-10-CM

## 2013-10-26 DIAGNOSIS — E1110 Type 2 diabetes mellitus with ketoacidosis without coma: Secondary | ICD-10-CM

## 2013-10-26 LAB — BASIC METABOLIC PANEL
BUN: 22 mg/dL (ref 6–23)
BUN: 20 mg/dL (ref 6–23)
BUN: 17 mg/dL (ref 6–23)
CHLORIDE: 112 meq/L (ref 96–112)
CO2: 14 meq/L — AB (ref 19–32)
CO2: 15 meq/L — AB (ref 19–32)
CO2: 18 meq/L — AB (ref 19–32)
CREATININE: 1.43 mg/dL — AB (ref 0.50–1.10)
Calcium: 7 mg/dL — ABNORMAL LOW (ref 8.4–10.5)
Calcium: 7.2 mg/dL — ABNORMAL LOW (ref 8.4–10.5)
Calcium: 7.4 mg/dL — ABNORMAL LOW (ref 8.4–10.5)
Chloride: 113 mEq/L — ABNORMAL HIGH (ref 96–112)
Chloride: 116 mEq/L — ABNORMAL HIGH (ref 96–112)
Creatinine, Ser: 1.45 mg/dL — ABNORMAL HIGH (ref 0.50–1.10)
Creatinine, Ser: 1.45 mg/dL — ABNORMAL HIGH (ref 0.50–1.10)
GFR calc Af Amer: 44 mL/min — ABNORMAL LOW (ref >90)
GFR calc Af Amer: 43 mL/min — ABNORMAL LOW (ref >90)
GFR calc Af Amer: 43 mL/min — ABNORMAL LOW (ref >90)
GFR calc non Af Amer: 38 mL/min — ABNORMAL LOW (ref >90)
GFR calc non Af Amer: 37 mL/min — ABNORMAL LOW (ref >90)
GFR calc non Af Amer: 37 mL/min — ABNORMAL LOW (ref >90)
GLUCOSE: 181 mg/dL — AB (ref 70–99)
GLUCOSE: 193 mg/dL — AB (ref 70–99)
GLUCOSE: 146 mg/dL — AB (ref 70–99)
Potassium: 3.9 mEq/L (ref 3.7–5.3)
Potassium: 4.1 mEq/L (ref 3.7–5.3)
Potassium: 4 mEq/L (ref 3.7–5.3)
SODIUM: 143 meq/L (ref 137–147)
SODIUM: 147 meq/L (ref 137–147)
Sodium: 145 mEq/L (ref 137–147)

## 2013-10-26 LAB — GLUCOSE, CAPILLARY
GLUCOSE-CAPILLARY: 149 mg/dL — AB (ref 70–99)
GLUCOSE-CAPILLARY: 157 mg/dL — AB (ref 70–99)
GLUCOSE-CAPILLARY: 158 mg/dL — AB (ref 70–99)
GLUCOSE-CAPILLARY: 160 mg/dL — AB (ref 70–99)
GLUCOSE-CAPILLARY: 162 mg/dL — AB (ref 70–99)
GLUCOSE-CAPILLARY: 163 mg/dL — AB (ref 70–99)
GLUCOSE-CAPILLARY: 165 mg/dL — AB (ref 70–99)
GLUCOSE-CAPILLARY: 188 mg/dL — AB (ref 70–99)
GLUCOSE-CAPILLARY: 188 mg/dL — AB (ref 70–99)
Glucose-Capillary: 130 mg/dL — ABNORMAL HIGH (ref 70–99)
Glucose-Capillary: 133 mg/dL — ABNORMAL HIGH (ref 70–99)
Glucose-Capillary: 134 mg/dL — ABNORMAL HIGH (ref 70–99)
Glucose-Capillary: 141 mg/dL — ABNORMAL HIGH (ref 70–99)
Glucose-Capillary: 143 mg/dL — ABNORMAL HIGH (ref 70–99)
Glucose-Capillary: 145 mg/dL — ABNORMAL HIGH (ref 70–99)
Glucose-Capillary: 147 mg/dL — ABNORMAL HIGH (ref 70–99)
Glucose-Capillary: 149 mg/dL — ABNORMAL HIGH (ref 70–99)
Glucose-Capillary: 150 mg/dL — ABNORMAL HIGH (ref 70–99)
Glucose-Capillary: 153 mg/dL — ABNORMAL HIGH (ref 70–99)
Glucose-Capillary: 153 mg/dL — ABNORMAL HIGH (ref 70–99)
Glucose-Capillary: 154 mg/dL — ABNORMAL HIGH (ref 70–99)
Glucose-Capillary: 164 mg/dL — ABNORMAL HIGH (ref 70–99)
Glucose-Capillary: 170 mg/dL — ABNORMAL HIGH (ref 70–99)
Glucose-Capillary: 173 mg/dL — ABNORMAL HIGH (ref 70–99)
Glucose-Capillary: 244 mg/dL — ABNORMAL HIGH (ref 70–99)

## 2013-10-26 LAB — POCT I-STAT 3, ART BLOOD GAS (G3+)
Acid-base deficit: 11 mmol/L — ABNORMAL HIGH (ref 0.0–2.0)
Bicarbonate: 15 mEq/L — ABNORMAL LOW (ref 20.0–24.0)
O2 SAT: 94 %
PCO2 ART: 32.9 mmHg — AB (ref 35.0–45.0)
TCO2: 16 mmol/L (ref 0–100)
pH, Arterial: 7.267 — ABNORMAL LOW (ref 7.350–7.450)
pO2, Arterial: 81 mmHg (ref 80.0–100.0)

## 2013-10-26 LAB — PHOSPHORUS
PHOSPHORUS: 3 mg/dL (ref 2.3–4.6)
Phosphorus: 2.3 mg/dL (ref 2.3–4.6)

## 2013-10-26 LAB — MAGNESIUM
MAGNESIUM: 2.1 mg/dL (ref 1.5–2.5)
MAGNESIUM: 2.1 mg/dL (ref 1.5–2.5)

## 2013-10-26 LAB — CBC
HCT: 25.5 % — ABNORMAL LOW (ref 36.0–46.0)
Hemoglobin: 8.6 g/dL — ABNORMAL LOW (ref 12.0–15.0)
MCH: 29.6 pg (ref 26.0–34.0)
MCHC: 33.7 g/dL (ref 30.0–36.0)
MCV: 87.6 fL (ref 78.0–100.0)
Platelets: 214 10*3/uL (ref 150–400)
RBC: 2.91 MIL/uL — AB (ref 3.87–5.11)
RDW: 15.2 % (ref 11.5–15.5)
WBC: 11.4 10*3/uL — ABNORMAL HIGH (ref 4.0–10.5)

## 2013-10-26 MED ORDER — NYSTATIN 100000 UNIT/GM EX CREA
TOPICAL_CREAM | Freq: Two times a day (BID) | CUTANEOUS | Status: DC
  Administered 2013-10-26 – 2013-10-27 (×4): via TOPICAL
  Administered 2013-10-28: 1 via TOPICAL
  Administered 2013-10-28 – 2013-10-29 (×2): via TOPICAL
  Filled 2013-10-26 (×2): qty 15

## 2013-10-26 MED ORDER — INSULIN ASPART 100 UNIT/ML ~~LOC~~ SOLN
0.0000 [IU] | SUBCUTANEOUS | Status: DC
  Administered 2013-10-26: 3 [IU] via SUBCUTANEOUS
  Administered 2013-10-26: 5 [IU] via SUBCUTANEOUS
  Administered 2013-10-27 (×2): 3 [IU] via SUBCUTANEOUS
  Administered 2013-10-27: 2 [IU] via SUBCUTANEOUS
  Administered 2013-10-27: 3 [IU] via SUBCUTANEOUS
  Administered 2013-10-28: 2 [IU] via SUBCUTANEOUS
  Administered 2013-10-28 – 2013-10-29 (×4): 3 [IU] via SUBCUTANEOUS
  Administered 2013-10-29 (×2): 2 [IU] via SUBCUTANEOUS

## 2013-10-26 MED ORDER — INSULIN GLARGINE 100 UNIT/ML ~~LOC~~ SOLN
10.0000 [IU] | Freq: Every day | SUBCUTANEOUS | Status: DC
  Administered 2013-10-26: 10 [IU] via SUBCUTANEOUS
  Filled 2013-10-26 (×2): qty 0.1

## 2013-10-26 MED ORDER — INSULIN GLARGINE 100 UNIT/ML ~~LOC~~ SOLN
10.0000 [IU] | Freq: Once | SUBCUTANEOUS | Status: AC
  Administered 2013-10-26: 10 [IU] via SUBCUTANEOUS
  Filled 2013-10-26: qty 0.1

## 2013-10-26 MED ORDER — SODIUM BICARBONATE 8.4 % IV SOLN
INTRAVENOUS | Status: DC
  Administered 2013-10-26 – 2013-10-27 (×2): via INTRAVENOUS
  Filled 2013-10-26 (×2): qty 150

## 2013-10-26 MED ORDER — INSULIN GLARGINE 100 UNIT/ML ~~LOC~~ SOLN
20.0000 [IU] | Freq: Every day | SUBCUTANEOUS | Status: DC
  Administered 2013-10-27 – 2013-10-28 (×2): 20 [IU] via SUBCUTANEOUS
  Filled 2013-10-26 (×2): qty 0.2

## 2013-10-26 MED ORDER — FUROSEMIDE 10 MG/ML IJ SOLN
40.0000 mg | Freq: Once | INTRAMUSCULAR | Status: AC
  Administered 2013-10-26: 40 mg via INTRAVENOUS
  Filled 2013-10-26: qty 4

## 2013-10-26 NOTE — Progress Notes (Signed)
Pt transferred to [id number]. Report given to Denyse Amass, [place]. All questions answered. Pt stable upon transfer, no questions per RN or pt.

## 2013-10-26 NOTE — Progress Notes (Addendum)
Inpatient Diabetes Program Recommendations  AACE/ADA: New Consensus Statement on Inpatient Glycemic Control (2013)  Target Ranges:  Prepandial:   less than 140 mg/dL      Peak postprandial:   less than 180 mg/dL (1-2 hours)      Critically ill patients:  140 - 180 mg/dL   Inpatient Diabetes Program Recommendations Insulin - Basal: Pt will likely need home dose of Lantus before gtt discontinued  (note:pt uses Levemir at home) Please increase Lantus dose, add Novolog sensitive scale and add meal coverage Novolog 4 units if patient eats >50% of meals. This coordinator called patient's RN-Crystal to alert of this recommendation. ADDENDUM: spoke with patient concerning admit with DKA. Patient states she has been in DKA before and has had DM for 40 years. She reports several illnesses leading up to this event. Pt. Said she likely missed a dose of Levemir because she felt so bad. Reports having stomach pain every time she eats and no reason has been found. ?gastroparesis? Asked patient to try eating small, frequent meals and discuss with Dr. Buddy Duty endocrinologist.  Thank you  Raoul Pitch BSN, RN,CDE Inpatient Diabetes Coordinator [phone] (team pager)

## 2013-10-26 NOTE — Progress Notes (Addendum)
Pt arrived to unit. Pt was oriented to room, tele box was placed on pt, cbg was taken,&  ccmd was notified. Pt's husband was called. VSS. Pt in no apparent stress. RN to continue to monitor pt.

## 2013-10-26 NOTE — Progress Notes (Signed)
Name: Bridget Martinez MRN: [id number] DOB: February 12, 1951    ADMISSION DATE:  10/25/2013 PRIMARY SERVICE: PCCM   CHIEF COMPLAINT: DKA   BRIEF PATIENT DESCRIPTION:  63 years old female with PMH relevant for CAD, post MI, HTN, dyslipidemia, IDDM with history of DKA. Presents with one day history of severe nausea, vomiting and altered mental status. At admission to the ED found to be in severe DKA.   SIGNIFICANT EVENTS / STUDIES:  - CT head with no acute intracranial process.  - Chest X ray with no evidence of parenchymal lung disease.   LINES / TUBES:  - 3 peripheral IV's  - Foley catheter   CULTURES:  - Blood cultures: NGTD - Urine culture: neg  ANTIBIOTICS:  - Zosyn 2/16>> - Vancomycin 2/16>>2/16 - Diflucan 2/16>>  SUBJECTIVE: Pt reports she is feeling much better toady. She and her husband has been sick with URI the past few days. Remains on insulin drip.   VITAL SIGNS: Temp:  [98.2 F (36.8 C)-99.6 F (37.6 C)] 98.5 F (36.9 C) (02/17 0700) Pulse Rate:  [72-106] 78 (02/17 0700) Resp:  [16-29] 20 (02/17 0700) BP: (88-132)/(35-90) 121/42 mmHg (02/17 0700) SpO2:  [94 %-100 %] 97 % (02/17 0700) Weight:  [189 lb 11.2 oz (86.047 kg)] 189 lb 11.2 oz (86.047 kg) (02/16 0800) HEMODYNAMICS:   VENTILATOR SETTINGS:   INTAKE / OUTPUT: Intake/Output     02/16 0701 - 02/17 0700 02/17 0701 - 02/18 0700   I.V. (mL/kg) 2742.1 (31.9)    IV Piggyback 806.7    Total Intake(mL/kg) 3548.8 (41.2)    Urine (mL/kg/hr) 3195 (1.5)    Total Output 3195     Net +353.8            PHYSICAL EXAMINATION: General: Sleeping comfortably, awakens easily to name. Coughing.  Neuro: follows commands, Alert and Oriented  HEENT: jvd flat  PULM: Bilateral wheezing noted. Diminished air movement.  CV: s1 s2 RRR no r  GI: soft, BS wnl no r/g  Extremities: gen edema, not pitting, erythema left groin , fem line   LABS:  CBC  Recent Labs Lab 10/25/13 0127  10/25/13 0400 10/25/13 0405  10/26/13 0457  WBC 16.1*  --  13.8*  --  11.4*  HGB 11.0*  < > 9.5* 11.6* 8.6*  HCT 35.3*  < > 30.3* 34.0* 25.5*  PLT 342  --  265  --  214  < > = values in this interval not displayed. Coag's  Recent Labs Lab 10/25/13 0455  APTT 20*  INR 1.42   BMET  Recent Labs Lab 10/25/13 2300 10/26/13 0300 10/26/13 0658  NA 145 143 147  K 3.8 3.9 4.1  CL 116* 113* 116*  CO2 14* 14* 15*  BUN 23 22 20   CREATININE 1.40* 1.43* 1.45*  GLUCOSE 163* 181* 193*   Electrolytes  Recent Labs Lab 10/25/13 1444  10/25/13 2000 10/25/13 2300 10/26/13 0300 10/26/13 0430 10/26/13 0658  CALCIUM 7.0*  < >  --  7.2* 7.0*  --  7.2*  MG 1.7  --  1.7  --   --  2.1  --   PHOS 2.9  --  2.2*  --   --  3.0  --   < > = values in this interval not displayed. Sepsis Markers  Recent Labs Lab 10/25/13 0132 10/25/13 1500  LATICACIDVEN 1.83 1.3   ABG  Recent Labs Lab 10/25/13 1726  PHART 7.210*  PCO2ART 27.7*  PO2ART 85.0  Liver Enzymes No results found for this basename: AST, ALT, ALKPHOS, BILITOT, ALBUMIN,  in the last 168 hours Cardiac Enzymes  Recent Labs Lab 10/25/13 0127  TROPONINI <0.30   Glucose  Recent Labs Lab 10/26/13 0154 10/26/13 0249 10/26/13 0353 10/26/13 0457 10/26/13 0600 10/26/13 0656  GLUCAP 162* 153* 170* 160* 188* 158*    Imaging Ct Abdomen Pelvis Wo Contrast  10/25/2013   CLINICAL DATA:  Nausea, vomiting, DKA  EXAM: CT ABDOMEN AND PELVIS WITHOUT CONTRAST  TECHNIQUE: Multidetector CT imaging of the abdomen and pelvis was performed following the standard protocol without intravenous contrast.  COMPARISON:  Prior ultrasound from 05/23/2012  FINDINGS: The study is degraded by motion artifact.  The visualized lung bases are grossly clear.  Limited noncontrast evaluation of the liver is unremarkable. Gallbladder is within normal limits. No biliary ductal dilatation. The spleen, adrenal glands, and pancreas demonstrate a normal unenhanced appearance.  The kidneys  are equal in size without evidence of nephrolithiasis or hydronephrosis.  The bowels are grossly unremarkable without evidence of obstruction or inflammatory changes. Scattered colonic diverticula are present without acute diverticulitis. Appendix is visualized within the right lower quadrant and is of normal caliber without definite inflammatory changes.  Bladder is decompressed with a Foley catheter in place. Gas lucency within the bladder likely related to catheterization. The uterus and ovaries are within normal limits for patient age.  No free air or fluid identified. No enlarged intra-abdominal pelvic lymph nodes. There is asymmetric mild inflammatory soft tissue stranding within the subcutaneous fat in the left inguinal region adjacent to the left femoral artery (series 2, image 88). Finding is of uncertain significance. No loculated collection.  Moderate atherosclerotic disease seen within the aorta and bilateral iliac arteries.  No acute osseous abnormality identified. Multilevel degenerative changes noted within the visualized spine. No worrisome lytic or blastic osseous lesions.  IMPRESSION: 1. No CT evidence of acute intra-abdominal or pelvic process. 2. Asymmetric soft tissue stranding within the subcutaneous fat of the left inguinal region adjacent to the left femoral artery. This finding is of uncertain clinical significance, and may be 3. Colonic diverticulosis without acute diverticulitis. 4. Moderate aorto bi-iliac atherosclerotic disease.   Electronically Signed   By: Jeannine Boga M.D.   On: 10/25/2013 05:42   Ct Head Wo Contrast  10/25/2013   CLINICAL DATA:  Altered mental status  EXAM: CT HEAD WITHOUT CONTRAST  TECHNIQUE: Contiguous axial images were obtained from the base of the skull through the vertex without intravenous contrast.  COMPARISON:  Prior CT from 05/26/2007  FINDINGS: There is no acute intracranial hemorrhage or infarct. No mass lesion or midline shift. Gray-white  matter differentiation is well maintained. Ventricles are normal in size without evidence of hydrocephalus. Cerebral volume within normal limits for patient age. Mild hypoattenuation within the periventricular white matter likely represents very mild chronic microvascular ischemic changes. No extra-axial fluid collection.  The calvarium is intact. Prominent calcifications subjacent to the inner table of the left frontal calvarium is unchanged.  Orbital soft tissues are within normal limits.  The paranasal sinuses and mastoid air cells are well pneumatized and free of fluid.  Scalp soft tissues are unremarkable.  IMPRESSION: No acute intracranial process.   Electronically Signed   By: Jeannine Boga M.D.   On: 10/25/2013 03:32   Dg Chest Port 1 View  10/26/2013   CLINICAL DATA:  Shortness of breath  EXAM: PORTABLE CHEST - 1 VIEW  COMPARISON:  10/25/2013  FINDINGS: Prior coronary bypass changes  noted. Cardiomegaly evident with vascular congestion. Streaky perihilar and bibasilar atelectasis. No definite CHF or pneumonia. Negative for effusion or pneumothorax. Trachea midline.  IMPRESSION: Cardiomegaly with vascular congestion.  Bibasilar atelectasis  No significant interval change.   Electronically Signed   By: Daryll Brod M.D.   On: 10/26/2013 07:42   Dg Chest Portable 1 View  10/25/2013   CLINICAL DATA:  Hyperglycemic, altered mental status  EXAM: PORTABLE CHEST - 1 VIEW  COMPARISON:  DG CHEST 1V PORT dated 10/25/2013; DG CHEST 2 VIEW dated 05/23/2012; DG CHEST 1V PORT dated 11/14/2011  FINDINGS: Heart size upper normal to mildly enlarged but stable. Patient is status post CABG. Vascular pattern within normal limits. There is no consolidation effusion or pulmonary edema.  IMPRESSION: No acute findings.   Electronically Signed   By: Skipper Cliche M.D.   On: 10/25/2013 07:23   Dg Chest Port 1 View  10/25/2013   CLINICAL DATA:  Dyspnea, cough  EXAM: PORTABLE CHEST - 1 VIEW  COMPARISON:  DG CHEST 2 VIEW  dated 05/23/2012  FINDINGS: Sternotomy wires overlie normal cardiac silhouette. No effusion, infiltrate, or pneumothorax. No osseous abnormality.  IMPRESSION: No acute cardiopulmonary process.   Electronically Signed   By: Suzy Bouchard M.D.   On: 10/25/2013 01:54   CXR: int edema  ASSESSMENT / PLAN:  PULMONARY  A:  1) Kussmaul breathing due to DKA. Able to protect her airway, uncompensated met acidosis  P:  - Supplemental O2 to keep O2 sat >94%  - Albuterol Q2 PRN - abg noted, continue to correct met acidosis, see renal  CARDIOVASCULAR  A:  1) Hypotension likely secondary to severe volume depletion vs sepsis -resolved 2) Echocardiogram from January with LVEF of 30 to 35%  3) HTN  4) Negative troponin at admission.  5) LBBB. Has a previous EKG with LBBB but most recent one did not have it.  P:  - limit volume now with edema pcxr and known EF 30% -1/2 NS kvo -may need early lasix  RENAL  A:  1) Acute renal failure, likely pre renal 2) Hypernatremia: resolved 3) Hyperkalemia: resolved 4) Met acid AG  5) small NON AG acidosis, r/o secondary to saline vs rta 4  6) lasix dependence P:  - NON AG is larger than her AG and still with acidosis, add bicarb 3 amps in d5w ( we need a source of glu) (likley in am can dc) - avoid saline  - bmet q4h; AG 16, delta4  - Lactic acid normal (1.3)  GASTROINTESTINAL  A:  1) Nausea and vomiting- improving 2) GERD  P:  - Protonix IV  - maintain NPO   HEMATOLOGIC  A:  1) DVT prevention, dilutional anemia P:  - Will follow CBC in am  - sub q hep  -may need early lasix  INFECTIOUS  A:  1) No clear source of infection  2) Groin erythema, r/o contact related, fungal? cellultitis  P:  - Will cover empirically with Zosyn, diflucan -dc vanc -add nystatin cream - CT of the pelvis neg  - Cultures neg to date  ENDOCRINE  A:  1) DKA improving 2) Hypothyroidism  P:  -DKA protocol to dc as we transition to source d5 in fluids, add  lantus and ssi - continue to avoid saline  -  D51/2 NS dc as we start bicarb for NON AG - assess mag, phos (normal) - synthroid, resumed - tsh WNL (0.370) - AG 16, delta 4 -diet when nausea  better, addc lears  NEUROLOGIC  A:  1) Altered mental status, likely metabolic encephalopathy: resolved 2) CT scan of the head with no acute intracranial process  P:  - Close monitoring of mental status, normal   TODAY'S SUMMARY: dc insulin drip, tranition to lantus , add bicarn for NON AG and continued acidosis, add clears  10/26/2013, 7:55 AM  I have fully examined this patient and agree with above findings.      [name]. Titus Mould, MD, [place] Pgr: [facility]

## 2013-10-26 NOTE — Progress Notes (Signed)
eLink Physician-Brief Progress Note Patient Name: Bridget Martinez DOB: 01-18-1951 MRN: [id number]  Date of Service  10/26/2013   HPI/Events of Note   DKA resolved, gap closed; mild saline related acidosis now Hyperglycemic > DM coordinator rec more long acting No short acting insulin orders written  eICU Interventions  SSI q4h moderate scale until eating Double glargine   Intervention Category Intermediate Interventions: Hyperglycemia - evaluation and treatment  Sinda Leedom 10/26/2013, 4:25 PM

## 2013-10-27 DIAGNOSIS — E872 Acidosis, unspecified: Secondary | ICD-10-CM

## 2013-10-27 DIAGNOSIS — J209 Acute bronchitis, unspecified: Secondary | ICD-10-CM | POA: Diagnosis present

## 2013-10-27 DIAGNOSIS — B3731 Acute candidiasis of vulva and vagina: Secondary | ICD-10-CM | POA: Diagnosis present

## 2013-10-27 DIAGNOSIS — B373 Candidiasis of vulva and vagina: Secondary | ICD-10-CM | POA: Diagnosis present

## 2013-10-27 DIAGNOSIS — I509 Heart failure, unspecified: Secondary | ICD-10-CM

## 2013-10-27 LAB — CBC WITH DIFFERENTIAL/PLATELET
BASOS ABS: 0 10*3/uL (ref 0.0–0.1)
BASOS PCT: 0 % (ref 0–1)
EOS PCT: 1 % (ref 0–5)
Eosinophils Absolute: 0 10*3/uL (ref 0.0–0.7)
HEMATOCRIT: 25.6 % — AB (ref 36.0–46.0)
Hemoglobin: 8.8 g/dL — ABNORMAL LOW (ref 12.0–15.0)
LYMPHS PCT: 21 % (ref 12–46)
Lymphs Abs: 1.6 10*3/uL (ref 0.7–4.0)
MCH: 29.8 pg (ref 26.0–34.0)
MCHC: 34.4 g/dL (ref 30.0–36.0)
MCV: 86.8 fL (ref 78.0–100.0)
Monocytes Absolute: 0.6 10*3/uL (ref 0.1–1.0)
Monocytes Relative: 7 % (ref 3–12)
Neutro Abs: 5.5 10*3/uL (ref 1.7–7.7)
Neutrophils Relative %: 71 % (ref 43–77)
Platelets: 205 10*3/uL (ref 150–400)
RBC: 2.95 MIL/uL — ABNORMAL LOW (ref 3.87–5.11)
RDW: 15.4 % (ref 11.5–15.5)
WBC: 7.7 10*3/uL (ref 4.0–10.5)

## 2013-10-27 LAB — BASIC METABOLIC PANEL
BUN: 14 mg/dL (ref 6–23)
CHLORIDE: 107 meq/L (ref 96–112)
CO2: 24 meq/L (ref 19–32)
CREATININE: 1.3 mg/dL — AB (ref 0.50–1.10)
Calcium: 7.6 mg/dL — ABNORMAL LOW (ref 8.4–10.5)
GFR calc Af Amer: 50 mL/min — ABNORMAL LOW (ref >90)
GFR calc non Af Amer: 43 mL/min — ABNORMAL LOW (ref >90)
Glucose, Bld: 164 mg/dL — ABNORMAL HIGH (ref 70–99)
Potassium: 3.1 mEq/L — ABNORMAL LOW (ref 3.7–5.3)
Sodium: 143 mEq/L (ref 137–147)

## 2013-10-27 LAB — GLUCOSE, CAPILLARY
GLUCOSE-CAPILLARY: 116 mg/dL — AB (ref 70–99)
GLUCOSE-CAPILLARY: 167 mg/dL — AB (ref 70–99)
GLUCOSE-CAPILLARY: 122 mg/dL — AB (ref 70–99)
Glucose-Capillary: 177 mg/dL — ABNORMAL HIGH (ref 70–99)
Glucose-Capillary: 109 mg/dL — ABNORMAL HIGH (ref 70–99)
Glucose-Capillary: 71 mg/dL (ref 70–99)

## 2013-10-27 LAB — MAGNESIUM
Magnesium: 1.8 mg/dL (ref 1.5–2.5)
Magnesium: 1.9 mg/dL (ref 1.5–2.5)

## 2013-10-27 LAB — PHOSPHORUS
PHOSPHORUS: 2.1 mg/dL — AB (ref 2.3–4.6)
Phosphorus: 2.1 mg/dL — ABNORMAL LOW (ref 2.3–4.6)

## 2013-10-27 MED ORDER — FUROSEMIDE 10 MG/ML IJ SOLN
40.0000 mg | Freq: Two times a day (BID) | INTRAMUSCULAR | Status: DC
  Administered 2013-10-27 – 2013-10-28 (×2): 40 mg via INTRAVENOUS
  Filled 2013-10-27 (×5): qty 4

## 2013-10-27 MED ORDER — FUROSEMIDE 20 MG PO TABS
20.0000 mg | ORAL_TABLET | Freq: Every day | ORAL | Status: DC
  Administered 2013-10-27: 20 mg via ORAL
  Filled 2013-10-27: qty 1

## 2013-10-27 MED ORDER — POTASSIUM CHLORIDE ER 10 MEQ PO TBCR
10.0000 meq | EXTENDED_RELEASE_TABLET | Freq: Every day | ORAL | Status: DC
  Administered 2013-10-27 – 2013-10-29 (×3): 10 meq via ORAL
  Filled 2013-10-27 (×3): qty 1

## 2013-10-27 MED ORDER — SPIRONOLACTONE 12.5 MG HALF TABLET
12.5000 mg | ORAL_TABLET | Freq: Every day | ORAL | Status: DC
  Administered 2013-10-27 – 2013-10-29 (×3): 12.5 mg via ORAL
  Filled 2013-10-27 (×3): qty 1

## 2013-10-27 MED ORDER — MAGNESIUM SULFATE 40 MG/ML IJ SOLN: 2.0000 g | Freq: Once | INTRAMUSCULAR | Status: DC

## 2013-10-27 MED ORDER — IPRATROPIUM-ALBUTEROL 0.5-2.5 (3) MG/3ML IN SOLN
3.0000 mL | Freq: Four times a day (QID) | RESPIRATORY_TRACT | Status: DC
  Administered 2013-10-27 – 2013-10-29 (×8): 3 mL via RESPIRATORY_TRACT
  Filled 2013-10-27 (×10): qty 3

## 2013-10-27 MED ORDER — FUROSEMIDE 10 MG/ML IJ SOLN: 40.0000 mg | Freq: Once | INTRAMUSCULAR | Status: DC

## 2013-10-27 MED ORDER — ESCITALOPRAM OXALATE 10 MG PO TABS
10.0000 mg | ORAL_TABLET | Freq: Every day | ORAL | Status: DC
  Administered 2013-10-27 – 2013-10-29 (×3): 10 mg via ORAL
  Filled 2013-10-27 (×3): qty 1

## 2013-10-27 MED ORDER — CAMPHOR-MENTHOL 0.5-0.5 % EX LOTN
TOPICAL_LOTION | CUTANEOUS | Status: DC | PRN
  Filled 2013-10-27: qty 222

## 2013-10-27 MED ORDER — HYDROCERIN EX CREA
TOPICAL_CREAM | Freq: Two times a day (BID) | CUTANEOUS | Status: DC
  Administered 2013-10-27: 1 via TOPICAL
  Administered 2013-10-27 – 2013-10-29 (×4): via TOPICAL
  Filled 2013-10-27: qty 113

## 2013-10-27 MED ORDER — PANTOPRAZOLE SODIUM 40 MG PO TBEC
40.0000 mg | DELAYED_RELEASE_TABLET | Freq: Every day | ORAL | Status: DC
  Administered 2013-10-27 – 2013-10-29 (×3): 40 mg via ORAL
  Filled 2013-10-27 (×3): qty 1

## 2013-10-27 MED ORDER — BENZONATATE 100 MG PO CAPS
200.0000 mg | ORAL_CAPSULE | Freq: Three times a day (TID) | ORAL | Status: DC | PRN
  Administered 2013-10-27: 200 mg via ORAL
  Filled 2013-10-27 (×2): qty 2

## 2013-10-27 MED ORDER — POTASSIUM CHLORIDE CRYS ER 20 MEQ PO TBCR
40.0000 meq | EXTENDED_RELEASE_TABLET | Freq: Three times a day (TID) | ORAL | Status: AC
  Administered 2013-10-27 – 2013-10-28 (×4): 40 meq via ORAL
  Filled 2013-10-27 (×4): qty 2

## 2013-10-27 MED ORDER — DEXTROMETHORPHAN POLISTIREX 30 MG/5ML PO LQCR
30.0000 mg | Freq: Two times a day (BID) | ORAL | Status: DC | PRN
  Administered 2013-10-27: 30 mg via ORAL
  Filled 2013-10-27 (×2): qty 5

## 2013-10-27 MED ORDER — ATORVASTATIN CALCIUM 80 MG PO TABS
80.0000 mg | ORAL_TABLET | Freq: Every day | ORAL | Status: DC
  Administered 2013-10-27 – 2013-10-28 (×2): 80 mg via ORAL
  Filled 2013-10-27 (×4): qty 1

## 2013-10-27 MED ORDER — DM-GUAIFENESIN ER 30-600 MG PO TB12
1.0000 | ORAL_TABLET | Freq: Two times a day (BID) | ORAL | Status: DC
  Administered 2013-10-27 – 2013-10-29 (×5): 1 via ORAL
  Filled 2013-10-27 (×6): qty 1

## 2013-10-27 MED ORDER — MAGNESIUM SULFATE 40 MG/ML IJ SOLN
2.0000 g | Freq: Once | INTRAMUSCULAR | Status: AC
  Administered 2013-10-27: 2 g via INTRAVENOUS
  Filled 2013-10-27: qty 50

## 2013-10-27 MED ORDER — FLUCONAZOLE 150 MG PO TABS
150.0000 mg | ORAL_TABLET | Freq: Every day | ORAL | Status: DC
  Administered 2013-10-27 – 2013-10-29 (×3): 150 mg via ORAL
  Filled 2013-10-27 (×3): qty 1

## 2013-10-27 NOTE — Progress Notes (Signed)
PROGRESS NOTE  Bridget Martinez [id number] DOB: March 12, 1951 DOA: 10/25/2013 PCP: [name], MD   63 years old female with PMH relevant for CAD, post MI, HTN, dyslipidemia, IDDM with history of DKA. Presents with one day history of severe nausea, vomiting and altered mental status. At admission to the ED found to be in severe DKA.  Assessment/Plan:  DKA Treated in ICU on Insulin drip. Will discontinue bicarb infusion. Now resolved and on SQ insulin. Patient sees Dr. Buddy Duty and is considering an insulin pump  Yeast Infection Vulvo-vaginal yeast infection.  Significant  Received IV diflucan will continue with PO at 150 daily and nystatin topical cream D/C all antibiotics.  Acute  Bronchitis Patient with hacking cough and wheeze - likely viral Received Vanc and Zosyn briefly in ICU.   Will discontinue antibiotics (due to severe yeast infection) and treat supportively.  HTN / Hypotension BP WNL on 2/18.  Will wait to restart coreg and vasotec  Acute on chronic renal failure Creatinine at baseline approx 1.5  Hypothyroidism Continue synthroid.  Acute Systolic CHF  Mild pulm vasc congestion on cxr 2/16 - received IV lasix. LVEF 30 - 35% in 09/2013 Will resume diuretics today-lasix 40 mg IV BID Add BB and ACE as soon as able.  Hypokalemia Will replenish with oral supplementation Mag supplementation as well  Altered mental status Acute encephalopathy due to DKA Resolved.  DVT Prophylaxis:  Heparin  Code Status: full Family Communication: daughter at bedside. Disposition Plan: inpatient.  To home when able.   Consultants:    Procedures:    Antibiotics: Anti-infectives   Start     Dose/Rate Route Frequency Ordered Stop   10/27/13 1200  fluconazole (DIFLUCAN) tablet 150 mg     150 mg Oral Daily 10/27/13 1146     10/25/13 1600  fluconazole (DIFLUCAN) IVPB 200 mg  Status:  Discontinued     200 mg 100 mL/hr over 60 Minutes Intravenous Every 24 hours  10/25/13 1445 10/27/13 1147   10/25/13 1600  piperacillin-tazobactam (ZOSYN) IVPB 3.375 g  Status:  Discontinued     3.375 g 12.5 mL/hr over 240 Minutes Intravenous 3 times per day 10/25/13 1527 10/27/13 1143   10/25/13 1500  fluconazole (DIFLUCAN) tablet 200 mg  Status:  Discontinued     200 mg Oral Daily 10/25/13 1442 10/25/13 1444   10/25/13 0300  piperacillin-tazobactam (ZOSYN) IVPB 3.375 g     3.375 g 100 mL/hr over 30 Minutes Intravenous  Once 10/25/13 0247 10/25/13 0322   10/25/13 0200  vancomycin (VANCOCIN) IVPB 1000 mg/200 mL premix     1,000 mg 200 mL/hr over 60 Minutes Intravenous  Once 10/25/13 0154 10/25/13 0346   10/25/13 0200  piperacillin-tazobactam (ZOSYN) IVPB 4.5 g  Status:  Discontinued     4.5 g 133.3 mL/hr over 30 Minutes Intravenous  Once 10/25/13 0154 10/25/13 0247        HPI/Subjective: Reports she's feeling much better.  Complains of hacking cough and SOB.  Objective: Filed Vitals:   10/27/13 0156 10/27/13 0645 10/27/13 1003 10/27/13 1125  BP: 120/72 126/70 127/70   Pulse: 81 77 86   Temp: 98.8 F (37.1 C) 98.1 F (36.7 C) 98 F (36.7 C)   TempSrc: Oral Oral Oral   Resp: 18 20 20    Height:      Weight:      SpO2: 98% 98% 98% 97%    Intake/Output Summary (Last 24 hours) at 10/27/13 1202 Last data filed at 10/27/13 [phone]  Gross per 24 hour  Intake   3880 ml  Output   1500 ml  Net   2380 ml   Filed Weights   10/25/13 0800 10/26/13 1857  Weight: 86.047 kg (189 lb 11.2 oz) 87.816 kg (193 lb 9.6 oz)    Exam: General: Well developed, well nourished, NAD, appears stated age.  Receiving breathing treatment. HEENT:  PERR, EOMI, Anicteic Sclera, MMM. No pharyngeal erythema or exudates  Neck: Supple, no JVD, no masses  Cardiovascular: RRR, S1 S2 auscultated, no rubs, murmurs or gallops.   Respiratory: Bilateral rals in bases. Abdomen: Soft, obese, nontender, nondistended, + bowel sounds  Extremities: warm dry without cyanosis clubbing or edema.    Neuro: AAOx3, cranial nerves grossly intact. Strength 5/5 in upper and lower extremities  Skin: Without rashes exudates or nodules.   Psych: Normal affect and demeanor with intact judgement and insight       Data Reviewed: Basic Metabolic Panel:  Recent Labs Lab 10/25/13 2000 10/25/13 2300 10/26/13 0300 10/26/13 0430 10/26/13 0658 10/26/13 1413 10/27/13 0001 10/27/13 0805  NA  --  145 143  --  147 145  --  143  K  --  3.8 3.9  --  4.1 4.0  --  3.1*  CL  --  116* 113*  --  116* 112  --  107  CO2  --  14* 14*  --  15* 18*  --  24  GLUCOSE  --  163* 181*  --  193* 146*  --  164*  BUN  --  23 22  --  20 17  --  14  CREATININE  --  1.40* 1.43*  --  1.45* 1.45*  --  1.30*  CALCIUM  --  7.2* 7.0*  --  7.2* 7.4*  --  7.6*  MG 1.7  --   --  2.1  --  2.1 1.9 1.8  PHOS 2.2*  --   --  3.0  --  2.3 2.1* 2.1*   CBC:  Recent Labs Lab 10/25/13 0127 10/25/13 0221 10/25/13 0400 10/25/13 0405 10/26/13 0457 10/27/13 0805  WBC 16.1*  --  13.8*  --  11.4* 7.7  NEUTROABS 13.8*  --   --   --   --  5.5  HGB 11.0* 13.6 9.5* 11.6* 8.6* 8.8*  HCT 35.3* 40.0 30.3* 34.0* 25.5* 25.6*  MCV 96.2  --  94.7  --  87.6 86.8  PLT 342  --  265  --  214 205   Cardiac Enzymes:  Recent Labs Lab 10/25/13 0127 10/25/13 0400  CKTOTAL  --  25  TROPONINI <0.30  --    CBG:  Recent Labs Lab 10/26/13 1520 10/26/13 1902 10/26/13 2330 10/27/13 0409 10/27/13 0832  GLUCAP 244* 188* 163* 116* 167*    Recent Results (from the past 240 hour(s))  CULTURE, BLOOD (ROUTINE X 2)     Status: None   Collection Time    10/25/13  2:00 AM      Result Value Ref Range Status   Specimen Description BLOOD   Final   Special Requests     Final   Value: RIGHT EJ BOTTLES DRAWN AEROBIC AND ANAEROBIC 10CC EA   Culture  Setup Time     Final   Value: 10/25/2013 08:35     Performed at [facility]   Culture     Final   Value:        BLOOD CULTURE RECEIVED NO GROWTH TO DATE  CULTURE WILL BE HELD FOR 5  DAYS BEFORE ISSUING A FINAL NEGATIVE REPORT     Performed at [facility]   Report Status PENDING   Incomplete  CULTURE, BLOOD (ROUTINE X 2)     Status: None   Collection Time    10/25/13  2:25 AM      Result Value Ref Range Status   Specimen Description BLOOD RIGHT HAND   Final   Special Requests BOTTLES DRAWN AEROBIC ONLY 4CC   Final   Culture  Setup Time     Final   Value: 10/25/2013 08:35     Performed at [facility]   Culture     Final   Value:        BLOOD CULTURE RECEIVED NO GROWTH TO DATE CULTURE WILL BE HELD FOR 5 DAYS BEFORE ISSUING A FINAL NEGATIVE REPORT     Performed at [facility]   Report Status PENDING   Incomplete  URINE CULTURE     Status: None   Collection Time    10/25/13  2:57 AM      Result Value Ref Range Status   Specimen Description URINE, RANDOM   Final   Special Requests NONE   Final   Culture  Setup Time     Final   Value: 10/25/2013 03:58     Performed at [place]     Final   Value: NO GROWTH     Performed at [facility]   Culture     Final   Value: NO GROWTH     Performed at [facility]   Report Status 10/25/2013 FINAL   Final  MRSA PCR SCREENING     Status: None   Collection Time    10/25/13  8:02 AM      Result Value Ref Range Status   MRSA by PCR NEGATIVE  NEGATIVE Final   Comment:            The GeneXpert MRSA Assay (FDA     approved for NASAL specimens     only), is one component of a     comprehensive MRSA colonization     surveillance program. It is not     intended to diagnose MRSA     infection nor to guide or     monitor treatment for     MRSA infections.     Studies: Dg Chest Port 1 View  10/26/2013   CLINICAL DATA:  Shortness of breath  EXAM: PORTABLE CHEST - 1 VIEW  COMPARISON:  10/25/2013  FINDINGS: Prior coronary bypass changes noted. Cardiomegaly evident with vascular congestion. Streaky perihilar and bibasilar atelectasis. No definite CHF or  pneumonia. Negative for effusion or pneumothorax. Trachea midline.  IMPRESSION: Cardiomegaly with vascular congestion.  Bibasilar atelectasis  No significant interval change.   Electronically Signed   By: Daryll Brod M.D.   On: 10/26/2013 07:42    Scheduled Meds: . aspirin EC  325 mg Oral Daily  . atorvastatin  80 mg Oral q1800  . clopidogrel  75 mg Oral Q breakfast  . dextromethorphan-guaiFENesin  1 tablet Oral BID  . escitalopram  10 mg Oral Daily  . fluconazole  150 mg Oral Daily  . furosemide  20 mg Oral Daily  . heparin  5,000 Units Subcutaneous 3 times per day  . hydrocerin   Topical BID  . insulin aspart  0-15 Units Subcutaneous 6 times per day  .  insulin glargine  20 Units Subcutaneous Daily  . ipratropium-albuterol  3 mL Nebulization Q6H  . levothyroxine  125 mcg Oral QAC breakfast  . magnesium sulfate 1 - 4 g bolus IVPB  2 g Intravenous Once  . nystatin cream   Topical BID  . pantoprazole  40 mg Oral Daily  . potassium chloride  10 mEq Oral Daily  . potassium chloride  40 mEq Oral TID  . spironolactone  12.5 mg Oral Daily   Continuous Infusions:    Active Problems:   HTN (hypertension)   Hyperlipidemia   CAD, multiple vessel   DM type 1 causing renal disease   Altered mental status   DKA (diabetic ketoacidoses)   CHF (congestive heart failure)   Acute bronchitis   Yeast infection of the vagina    Karen Kitchens  [facility] Pager [phone]. If 7PM-7AM, please contact night-coverage at [email or web address], password [email or web address] 10/27/2013, 12:02 PM  LOS: 2 days   Attending Patient seen and examined,agree with the assessment and plan as outlined above. Still with some SOB-has rales at bilateral bases-start Lasix IV BID.Stop and monitor off antibiotics. Rest as above  Nena Alexander MD

## 2013-10-27 NOTE — Evaluation (Signed)
Physical Therapy Evaluation Patient Details Name: Bridget Martinez MRN: [id number] DOB: 1951/05/22 Today's Date: 10/27/2013 Time: [id number] PT Time Calculation (min): 26 min  PT Assessment / Plan / Recommendation History of Present Illness  63 years old female with PMH relevant for CAD, post MI, HTN, dyslipidemia,  IDDM with history of DKA. Presents with one day history of severe nausea, vomiting and altered mental status. At admission to the ED found to be in severe DKA  Clinical Impression  Pt presents with decreased strength and balance and mobility, will benefit from acute PT services to address deficits and increase functional independence.    PT Assessment  Patient needs continued PT services    Follow Up Recommendations  Home health PT    Does the patient have the potential to tolerate intense rehabilitation      Barriers to Discharge        Equipment Recommendations  None recommended by PT    Recommendations for Other Services     Frequency Min 3X/week    Precautions / Restrictions Restrictions Weight Bearing Restrictions: No   Pertinent Vitals/Pain No c/o pain      Mobility  Bed Mobility Overal bed mobility: Modified Independent General bed mobility comments: uses rail Transfers Overall transfer level: Needs assistance Equipment used: None Transfers: Sit to/from Stand Sit to Stand: Min assist General transfer comment: slight lifting and steadying assist Ambulation/Gait Ambulation/Gait assistance: Min assist Ambulation Distance (Feet): 15 Feet Assistive device: 1 person hand held assist Gait velocity interpretation: Below normal speed for age/gender General Gait Details: decreased step and stride length, pt reports fear of falling due to being "so weak", min A for 1 LOB, delayed balance reactions noted. pt holds to sink and furniture to walk from bed to bathroom.  pt encouraged to use RW, pt states "I just don't want to have to use that".  pt educated on  benefit of using RW at home, she states understanding    Exercises     PT Diagnosis: Difficulty walking;Generalized weakness  PT Problem List: Decreased strength;Decreased mobility;Decreased activity tolerance;Decreased balance;Decreased knowledge of use of DME PT Treatment Interventions: Gait training;Balance training;Modalities;Neuromuscular re-education;Stair training;Functional mobility training;Patient/family education;Therapeutic activities;DME instruction;Wheelchair mobility training;Therapeutic exercise     PT Goals(Current goals can be found in the care plan section) Acute Rehab PT Goals Patient Stated Goal: get stronger PT Goal Formulation: With patient Time For Goal Achievement: 11/03/13 Potential to Achieve Goals: Good  Visit Information  Last PT Received On: 10/27/13 Assistance Needed: +1 History of Present Illness: 63 years old female with PMH relevant for CAD, post MI, HTN, dyslipidemia,  IDDM with history of DKA. Presents with one day history of severe nausea, vomiting and altered mental status. At admission to the ED found to be in severe DKA       Prior [place] expects to be discharged to:: Private residence Living Arrangements: Spouse/significant other Available Help at Discharge: Family Type of Home: House Home Access: Stairs to enter Technical brewer of Steps: 4 Entrance Stairs-Rails: Right Home Layout: One level Home Equipment: Environmental consultant - 2 wheels Prior Function Level of Independence: Independent Communication Communication: No difficulties    Cognition  Cognition Arousal/Alertness: Awake/alert Behavior During Therapy: WFL for tasks assessed/performed Overall Cognitive Status: Within Functional Limits for tasks assessed    Extremity/Trunk Assessment Upper Extremity Assessment Upper Extremity Assessment: Generalized weakness Lower Extremity Assessment Lower Extremity Assessment: Generalized weakness Cervical /  Trunk Assessment Cervical / Trunk Assessment: Normal   Balance Balance  Overall balance assessment: Needs assistance Standing balance support: No upper extremity supported Standing balance-Leahy Scale: Poor Standing balance comment: min A for standing balance with hand washing and hygiene after bathroom  End of Session PT - End of Session Activity Tolerance: Patient tolerated treatment well Patient left: in bed;with call bell/phone within reach Nurse Communication: Mobility status  GP     Bridget Martinez 10/27/2013, 9:04 AM

## 2013-10-28 ENCOUNTER — Inpatient Hospital Stay (HOSPITAL_COMMUNITY): Payer: BC Managed Care – PPO

## 2013-10-28 DIAGNOSIS — I5022 Chronic systolic (congestive) heart failure: Secondary | ICD-10-CM

## 2013-10-28 DIAGNOSIS — B3731 Acute candidiasis of vulva and vagina: Secondary | ICD-10-CM

## 2013-10-28 DIAGNOSIS — B373 Candidiasis of vulva and vagina: Secondary | ICD-10-CM

## 2013-10-28 DIAGNOSIS — I2510 Atherosclerotic heart disease of native coronary artery without angina pectoris: Secondary | ICD-10-CM

## 2013-10-28 LAB — CBC
HCT: 26.2 % — ABNORMAL LOW (ref 36.0–46.0)
Hemoglobin: 8.8 g/dL — ABNORMAL LOW (ref 12.0–15.0)
MCH: 29.6 pg (ref 26.0–34.0)
MCHC: 33.6 g/dL (ref 30.0–36.0)
MCV: 88.2 fL (ref 78.0–100.0)
Platelets: 203 10*3/uL (ref 150–400)
RBC: 2.97 MIL/uL — ABNORMAL LOW (ref 3.87–5.11)
RDW: 15.6 % — AB (ref 11.5–15.5)
WBC: 6.3 10*3/uL (ref 4.0–10.5)

## 2013-10-28 LAB — BASIC METABOLIC PANEL
BUN: 13 mg/dL (ref 6–23)
CALCIUM: 8 mg/dL — AB (ref 8.4–10.5)
CO2: 24 meq/L (ref 19–32)
CREATININE: 1.21 mg/dL — AB (ref 0.50–1.10)
Chloride: 105 mEq/L (ref 96–112)
GFR calc Af Amer: 54 mL/min — ABNORMAL LOW (ref >90)
GFR calc non Af Amer: 47 mL/min — ABNORMAL LOW (ref >90)
Glucose, Bld: 176 mg/dL — ABNORMAL HIGH (ref 70–99)
Potassium: 3.9 mEq/L (ref 3.7–5.3)
SODIUM: 144 meq/L (ref 137–147)

## 2013-10-28 LAB — PRO B NATRIURETIC PEPTIDE: PRO B NATRI PEPTIDE: 11269 pg/mL — AB (ref 0–125)

## 2013-10-28 LAB — GLUCOSE, CAPILLARY
GLUCOSE-CAPILLARY: 187 mg/dL — AB (ref 70–99)
GLUCOSE-CAPILLARY: 111 mg/dL — AB (ref 70–99)
GLUCOSE-CAPILLARY: 186 mg/dL — AB (ref 70–99)
Glucose-Capillary: 126 mg/dL — ABNORMAL HIGH (ref 70–99)
Glucose-Capillary: 157 mg/dL — ABNORMAL HIGH (ref 70–99)
Glucose-Capillary: 170 mg/dL — ABNORMAL HIGH (ref 70–99)

## 2013-10-28 MED ORDER — ENALAPRIL MALEATE 2.5 MG PO TABS
2.5000 mg | ORAL_TABLET | Freq: Every day | ORAL | Status: DC
  Administered 2013-10-28 – 2013-10-29 (×2): 2.5 mg via ORAL
  Filled 2013-10-28 (×3): qty 1

## 2013-10-28 MED ORDER — INSULIN GLARGINE 100 UNIT/ML ~~LOC~~ SOLN
16.0000 [IU] | Freq: Every day | SUBCUTANEOUS | Status: DC
  Administered 2013-10-29: 16 [IU] via SUBCUTANEOUS
  Filled 2013-10-28: qty 0.16

## 2013-10-28 MED ORDER — FUROSEMIDE 40 MG PO TABS
40.0000 mg | ORAL_TABLET | Freq: Every day | ORAL | Status: DC
  Administered 2013-10-29: 40 mg via ORAL
  Filled 2013-10-28: qty 1

## 2013-10-28 MED ORDER — CARVEDILOL 6.25 MG PO TABS
6.2500 mg | ORAL_TABLET | Freq: Two times a day (BID) | ORAL | Status: DC
  Administered 2013-10-28 – 2013-10-29 (×2): 6.25 mg via ORAL
  Filled 2013-10-28 (×4): qty 1

## 2013-10-28 MED ORDER — FUROSEMIDE 10 MG/ML IJ SOLN: 40.0000 mg | Freq: Every day | INTRAMUSCULAR | Status: DC

## 2013-10-28 MED ORDER — FUROSEMIDE 10 MG/ML IJ SOLN
40.0000 mg | Freq: Once | INTRAMUSCULAR | Status: AC
  Administered 2013-10-28: 40 mg via INTRAVENOUS

## 2013-10-28 NOTE — Progress Notes (Signed)
Hypoglycemic Event  CBG: 71  Treatment: graham crackers and peanut butter.coke Symptoms: none  Follow-up CBG: Time:0050 CBG Result:170  Possible Reasons for Event: uknown  Comments/MD notified:no    Jule Economy  Remember to initiate Hypoglycemia Order Set & complete

## 2013-10-28 NOTE — Progress Notes (Signed)
PROGRESS NOTE  Bridget Martinez [id number] DOB: [birth year] DOA: 10/25/2013 PCP: [name], MD   63 years old female with PMH relevant for CAD, post MI, HTN, dyslipidemia, IDDM with history of DKA. Presents with one day history of severe nausea, vomiting and altered mental status. At admission to the ED found to be in severe DKA.  Assessment/Plan:  Acute Systolic CHF  Cardiology consulted.  They agree with diuresis and have started lower dose BB and ACE. Mild pulm vasc congestion on cxr 2/16  Recieived IV lasix bid 2/18, 2/19, and symptoms have significantly improved. LVEF 30 - 35% in 09/2013 Will resume PO lasix on 2/20.  Acute  Bronchitis Patient with hacking cough and wheeze - likely viral Improving 2/19 Received Vanc and Zosyn briefly in ICU.   Will discontinue antibiotics (due to severe yeast infection) and treat supportively.  DKA Admitted in DKA.  Treated in ICU on Insulin drip. Briefly required bi-carb infusion. Now resolved and on SQ insulin. Had an episode of mild hypoglycemia 2/18 evening.   Titrating insulin dosing. Patient sees Dr. Buddy Duty and is considering an insulin pump  Yeast Infection Vulvo-vaginal yeast infection.  Significant  Received IV diflucan will continue with PO at 150 daily and nystatin topical cream D/C all antibiotics.  HTN / Hypotension BP WNL on 2/18.  Will wait to restart coreg and vasotec  Anemia  Hgb dropped from 11.6 >>8.8 Some dilution.  Will monitor.  Acute on chronic renal failure Creatinine at baseline approx 1.5  Hypothyroidism Continue synthroid.  Hypokalemia Will replenish with oral supplementation Mag supplementation as well  Altered mental status Acute encephalopathy due to DKA Resolved.  DVT Prophylaxis:  Heparin  Code Status: full Family Communication: daughter at bedside. Disposition Plan: inpatient.  To home when able.   Consultants:  Cardiology  Procedures:  Antibiotics: Anti-infectives   Start     Dose/Rate Route Frequency Ordered Stop   10/27/13 1200  fluconazole (DIFLUCAN) tablet 150 mg     150 mg Oral Daily 10/27/13 1146     10/25/13 1600  fluconazole (DIFLUCAN) IVPB 200 mg  Status:  Discontinued     200 mg 100 mL/hr over 60 Minutes Intravenous Every 24 hours 10/25/13 1445 10/27/13 1147   10/25/13 1600  piperacillin-tazobactam (ZOSYN) IVPB 3.375 g  Status:  Discontinued     3.375 g 12.5 mL/hr over 240 Minutes Intravenous 3 times per day 10/25/13 1527 10/27/13 1143   10/25/13 1500  fluconazole (DIFLUCAN) tablet 200 mg  Status:  Discontinued     200 mg Oral Daily 10/25/13 1442 10/25/13 1444   10/25/13 0300  piperacillin-tazobactam (ZOSYN) IVPB 3.375 g     3.375 g 100 mL/hr over 30 Minutes Intravenous  Once 10/25/13 0247 10/25/13 0322   10/25/13 0200  vancomycin (VANCOCIN) IVPB 1000 mg/200 mL premix     1,000 mg 200 mL/hr over 60 Minutes Intravenous  Once 10/25/13 0154 10/25/13 0346   10/25/13 0200  piperacillin-tazobactam (ZOSYN) IVPB 4.5 g  Status:  Discontinued     4.5 g 133.3 mL/hr over 30 Minutes Intravenous  Once 10/25/13 0154 10/25/13 0247        HPI/Subjective: Reports she's feeling much better, but clarify's that she is not yet ready to go home.  Objective: Filed Vitals:   10/28/13 0430 10/28/13 0813 10/28/13 1323 10/28/13 1430  BP: 116/74  119/96 130/78  Pulse: 78   80  Temp: 98.1 F (36.7 C)   99.4 F (37.4 C)  TempSrc: Oral  Oral  Resp: 15   16  Height:      Weight:      SpO2: 100% 98%  96%    Intake/Output Summary (Last 24 hours) at 10/28/13 1448 Last data filed at 10/28/13 1300  Gross per 24 hour  Intake    360 ml  Output     50 ml  Net    310 ml   Filed Weights   10/25/13 0800 10/26/13 1857 10/27/13 2122  Weight: 86.047 kg (189 lb 11.2 oz) 87.816 kg (193 lb 9.6 oz) 85.322 kg (188 lb 1.6 oz)    Exam: General: Well developed, well nourished, NAD, appears stated age.  Standing by the side of her bed. HEENT:  PERR, EOMI, Anicteic  Sclera, MMM. No pharyngeal erythema or exudates  Neck: Supple, no JVD, no masses  Cardiovascular: RRR, S1 S2 auscultated, no rubs, murmurs or gallops.   Respiratory: CTA, no wheeze, crackles or rals.. Abdomen: Soft, obese, nontender, nondistended, + bowel sounds  Extremities: warm dry without cyanosis clubbing or edema.  Neuro: AAOx3, cranial nerves grossly intact. Strength 5/5 in upper and lower extremities  Skin: Without rashes exudates or nodules.   Psych: Normal affect and demeanor with intact judgement and insight    Data Reviewed: Basic Metabolic Panel:  Recent Labs Lab 10/25/13 2000  10/26/13 0300 10/26/13 0430 10/26/13 0658 10/26/13 1413 10/27/13 0001 10/27/13 0805 10/28/13 0546  NA  --   < > 143  --  147 145  --  143 144  K  --   < > 3.9  --  4.1 4.0  --  3.1* 3.9  CL  --   < > 113*  --  116* 112  --  107 105  CO2  --   < > 14*  --  15* 18*  --  24 24  GLUCOSE  --   < > 181*  --  193* 146*  --  164* 176*  BUN  --   < > 22  --  20 17  --  14 13  CREATININE  --   < > 1.43*  --  1.45* 1.45*  --  1.30* 1.21*  CALCIUM  --   < > 7.0*  --  7.2* 7.4*  --  7.6* 8.0*  MG 1.7  --   --  2.1  --  2.1 1.9 1.8  --   PHOS 2.2*  --   --  3.0  --  2.3 2.1* 2.1*  --   < > = values in this interval not displayed. CBC:  Recent Labs Lab 10/25/13 0127  10/25/13 0400 10/25/13 0405 10/26/13 0457 10/27/13 0805 10/28/13 0546  WBC 16.1*  --  13.8*  --  11.4* 7.7 6.3  NEUTROABS 13.8*  --   --   --   --  5.5  --   HGB 11.0*  < > 9.5* 11.6* 8.6* 8.8* 8.8*  HCT 35.3*  < > 30.3* 34.0* 25.5* 25.6* 26.2*  MCV 96.2  --  94.7  --  87.6 86.8 88.2  PLT 342  --  265  --  214 205 203  < > = values in this interval not displayed. Cardiac Enzymes:  Recent Labs Lab 10/25/13 0127 10/25/13 0400  CKTOTAL  --  25  TROPONINI <0.30  --    CBG:  Recent Labs Lab 10/27/13 2322 10/28/13 0047 10/28/13 0414 10/28/13 0754 10/28/13 1203  GLUCAP 71 170* 187* 126* 157*    Recent Results (  from  the past 240 hour(s))  CULTURE, BLOOD (ROUTINE X 2)     Status: None   Collection Time    10/25/13  2:00 AM      Result Value Ref Range Status   Specimen Description BLOOD   Final   Special Requests     Final   Value: RIGHT EJ BOTTLES DRAWN AEROBIC AND ANAEROBIC 10CC EA   Culture  Setup Time     Final   Value: 10/25/2013 08:35     Performed at [facility]   Culture     Final   Value:        BLOOD CULTURE RECEIVED NO GROWTH TO DATE CULTURE WILL BE HELD FOR 5 DAYS BEFORE ISSUING A FINAL NEGATIVE REPORT     Performed at [facility]   Report Status PENDING   Incomplete  CULTURE, BLOOD (ROUTINE X 2)     Status: None   Collection Time    10/25/13  2:25 AM      Result Value Ref Range Status   Specimen Description BLOOD RIGHT HAND   Final   Special Requests BOTTLES DRAWN AEROBIC ONLY 4CC   Final   Culture  Setup Time     Final   Value: 10/25/2013 08:35     Performed at [facility]   Culture     Final   Value:        BLOOD CULTURE RECEIVED NO GROWTH TO DATE CULTURE WILL BE HELD FOR 5 DAYS BEFORE ISSUING A FINAL NEGATIVE REPORT     Performed at [facility]   Report Status PENDING   Incomplete  URINE CULTURE     Status: None   Collection Time    10/25/13  2:57 AM      Result Value Ref Range Status   Specimen Description URINE, RANDOM   Final   Special Requests NONE   Final   Culture  Setup Time     Final   Value: 10/25/2013 03:58     Performed at [place]     Final   Value: NO GROWTH     Performed at [facility]   Culture     Final   Value: NO GROWTH     Performed at [facility]   Report Status 10/25/2013 FINAL   Final  MRSA PCR SCREENING     Status: None   Collection Time    10/25/13  8:02 AM      Result Value Ref Range Status   MRSA by PCR NEGATIVE  NEGATIVE Final   Comment:            The GeneXpert MRSA Assay (FDA     approved for NASAL specimens     only), is one component of a      comprehensive MRSA colonization     surveillance program. It is not     intended to diagnose MRSA     infection nor to guide or     monitor treatment for     MRSA infections.     Studies: Dg Chest 2 View  10/28/2013   CLINICAL DATA:  Cough, clinical pneumonia  EXAM: CHEST  2 VIEW  COMPARISON:  DG CHEST 1V PORT dated 10/26/2013  FINDINGS: The lungs are well-expanded. There is no focal infiltrate. Tiny pleural effusions blunt the costophrenic angles bilaterally. The cardiopericardial silhouette is more normal in size today. The pulmonary vascularity is less  engorged. The patient has undergone previous CABG. There is no pneumothorax. The trachea is midline. The observed portions of the bony thorax exhibit no acute abnormalities.  IMPRESSION: The findings suggest resolving CHF and interstitial edema. There is no alveolar pneumonia. Small bilateral pleural effusions layer posteriorly.   Electronically Signed   By: David  Martinique   On: 10/28/2013 07:47    Scheduled Meds: . aspirin EC  325 mg Oral Daily  . atorvastatin  80 mg Oral q1800  . carvedilol  6.25 mg Oral BID WC  . clopidogrel  75 mg Oral Q breakfast  . dextromethorphan-guaiFENesin  1 tablet Oral BID  . enalapril  2.5 mg Oral Daily  . escitalopram  10 mg Oral Daily  . fluconazole  150 mg Oral Daily  . furosemide  40 mg Intravenous Once  . [START ON 10/29/2013] furosemide  40 mg Oral Daily  . heparin  5,000 Units Subcutaneous 3 times per day  . hydrocerin   Topical BID  . insulin aspart  0-15 Units Subcutaneous 6 times per day  . [START ON 10/29/2013] insulin glargine  16 Units Subcutaneous Daily  . ipratropium-albuterol  3 mL Nebulization Q6H  . levothyroxine  125 mcg Oral QAC breakfast  . nystatin cream   Topical BID  . pantoprazole  40 mg Oral Daily  . potassium chloride  10 mEq Oral Daily  . spironolactone  12.5 mg Oral Daily   Continuous Infusions:    Principal Problem:   DKA (diabetic ketoacidoses) Active Problems:   CHF  (congestive heart failure)   Acute bronchitis   HTN (hypertension)   Hyperlipidemia   CAD, multiple vessel   DM type 1 causing renal disease   Altered mental status   Yeast infection of the vagina    Karen Kitchens  [facility] Pager [phone]. If 7PM-7AM, please contact night-coverage at [email or web address], password [facility] 10/28/2013, 2:48 PM  LOS: 3 days   Attending Seen and examined, agree with the above assessment and plan. Appreciate cardiology followup. Monitor CBGs, suspect home 2/20.  Nena Alexander MD

## 2013-10-28 NOTE — Consult Note (Signed)
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   Admitted to the hospital 08/24/13 for altered mental status and was found to be severely acidotic and was intubated. Glucose on admission >700, temp 91.8 and BP 72/37. Blood cultures one out of two show coagulase-negative staph, urine cultures showed bacillus species  ECHO 09/21/13: 09/21/13 EF 35-40% with grade II DD (reviewed by Dr. Aundra Dubin)  Recently seen in the HF clinic 1/13 and was doing well. She was supposed to confirm dosages of lasix and enalapril when she returned home, but she never called back. Admitted 10/25/13 for DKA and started on insulin gtt. Minimal SOB. Denies orthopnea or CP.   Review of Systems: [y] = yes, [ ]  = no   General: Weight gain [ Y]; Weight loss [ ] ; Anorexia [ ] ; Fatigue [ ] ; Fever [ ] ; Chills [ ] ; Weakness [ ]   Cardiac: Chest pain/pressure [ ] ; Resting SOB [ N]; Exertional SOB [ N]; Orthopnea [ N]; Pedal Edema [Y ]; Palpitations [ ] ; Syncope [ ] ; Presyncope [ ] ; Paroxysmal nocturnal dyspnea[ ]   Pulmonary: Cough [ ] ; Wheezing[ ] ; Hemoptysis[ ] ; Sputum [ ] ; Snoring [ ]   GI: Vomiting[ ] ; Dysphagia[ ] ; Melena[ ] ; Hematochezia [ ] ; Heartburn[ ] ; Abdominal pain [ ] ; Constipation [ ] ; Diarrhea [ ] ; BRBPR [ ]   GU: Hematuria[ ] ; Dysuria [ ] ; Nocturia[ ]   Vascular: Pain in legs with walking [ ] ; Pain in feet with lying flat [ ] ; Non-healing sores [ ] ; Stroke [ ] ; TIA [ ] ; Slurred speech [ ] ;  Neuro: Headaches[ ] ; Vertigo[ ] ; Seizures[ ] ; Paresthesias[ ] ;Blurred vision [ ] ; Diplopia [ ] ; Vision changes [ ]   Ortho/Skin: Arthritis [ ] ; Joint pain [ ] ; Muscle pain [ ] ; Joint swelling [ ] ; Back Pain [ ] ; Rash [ ]   Psych: Depression[ ] ; Anxiety[ ]   Heme: Bleeding  problems [ ] ; Clotting disorders [ ] ; Anemia [ ]   Endocrine: Diabetes [Y ]; Thyroid dysfunction[ ]   Home Medications Prior to Admission medications   Medication Sig Start Date End Date Taking? Authorizing Provider  aspirin EC 325 MG tablet Take 325 mg by mouth daily.   Yes Historical Provider, MD  carvedilol (COREG) 12.5 MG tablet Take 12.5 mg by mouth 2 (two) times daily with a meal.   Yes Historical Provider, MD  clopidogrel (PLAVIX) 75 MG tablet Take 75 mg by mouth daily with breakfast.   Yes Historical Provider, MD  enalapril (VASOTEC) 2.5 MG tablet Take 1 tablet (2.5 mg total) by mouth 2 (two) times daily. 10/13/13  Yes Jolaine Artist, MD  escitalopram (LEXAPRO) 10 MG tablet Take 1 tablet (10 mg total) by mouth daily. 09/27/13  Yes Lysbeth Penner, FNP  furosemide (LASIX) 20 MG tablet Take 20 mg by mouth daily.    Yes Historical Provider, MD  insulin aspart (NOVOLOG) 100 UNIT/ML injection Inject 100 Units into the skin 3 (three) times daily with meals. 14 units at breakfast, 12 units at lunch, 7 units supper Plus 1 unit for every 35mg /dL above 100 mg/dL   Yes Historical Provider, MD  insulin detemir (LEVEMIR) 100 UNIT/ML injection Inject 20 Units into the skin 2 (two) times daily.   Yes Historical Provider,  MD  insulin lispro (HUMALOG) 100 UNIT/ML injection Inject 5-8 Units into the skin 3 (three) times daily before meals. Per sliding scale   Yes Historical Provider, MD  levothyroxine (SYNTHROID, LEVOTHROID) 125 MCG tablet Take 1 tablet (125 mcg total) by mouth daily. 09/27/13  Yes Lysbeth Penner, FNP  pantoprazole (PROTONIX) 40 MG tablet Take 40 mg by mouth daily. 11/26/11 12/31/13 Yes Shaune Pascal [name], MD  potassium chloride (K-DUR) 10 MEQ tablet Take 1 tablet (10 mEq total) by mouth daily. 07/14/12  Yes Wynetta Emery, PA-C  rosuvastatin (CRESTOR) 40 MG tablet Take 1 tablet (40 mg total) by mouth daily. 09/27/13  Yes Lysbeth Penner, FNP  spironolactone (ALDACTONE) 25 MG tablet Take  12.5 mg by mouth daily.   Yes Historical Provider, MD  alendronate (FOSAMAX) 70 MG tablet Take 1 tablet (70 mg total) by mouth once a week. 09/27/13   Lysbeth Penner, FNP  Vitamin D, Ergocalciferol, (DRISDOL) 50000 UNITS CAPS capsule Take 1 capsule (50,000 Units total) by mouth every 7 (seven) days. 09/29/13   Lysbeth Penner, FNP    Past Medical History: Past Medical History  Diagnosis Date  . Diabetes mellitus     on insulin, with h/o DKA   . HTN (hypertension)   . Hyperlipidemia   . Venous stasis ulcers   . Angina   . Arthritis   . Anxiety   . Depression   . 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 Heart murmur     Dr Haroldine Laws cardiologist  . Diabetes mellitus without complication   . Hypertension   . Myocardial infarction   . Coronary artery disease   . Pneumonia   . Anginal pain   . GERD (gastroesophageal reflux disease)   . Hypothyroidism   . CHF (congestive heart failure)   . H/O hiatal hernia   . Heart failure   . Tuberculosis   . Osteoporosis     Past Surgical History: Past Surgical History  Procedure Laterality Date  . Cardiac catheterization    . Tonsillectomy    . Coronary artery bypass graft      Dr. Prescott Gum in 09/2011   . Coronary artery bypass graft  2012    Family History: Family History  Problem Relation Age of Onset  . Heart disease Father   . Multiple sclerosis Father   . Hypertension Mother   . Hyperthyroidism Mother   . Diabetes Cousin     Multiple maternal cousins with type 2 diabetes mellitus  . Diabetes Maternal Uncle     Type 1 diabetes mellitus  . Heart attack Paternal Grandfather   . Heart disease Paternal Grandfather     Social History: History   Social History  . Marital Status: Married    Spouse Name: Bridget Martinez [phone]; Son Bridget Martinez    Number of Children: 2  . Years of Education: N/A   Social History Main Topics  . Smoking status: Never Smoker   . Smokeless tobacco: None  .  Alcohol Use: No  . Drug Use: No  . Sexual Activity: Yes    Birth Control/ Protection: Post-menopausal   Other Topics Concern  . None   Social History Narrative   ** Merged History Encounter **       Bridget Martinez (Husband): [phone] [facility]) [phone] (Cell)    Allergies:  Allergies  Allergen Reactions  . Sulfa Antibiotics Anaphylaxis and Swelling  . Sulfa Antibiotics  Difficulty breathing, stiff neck    Objective:    Vital Signs:   Temp:  [98.1 F (36.7 C)-98.3 F (36.8 C)] 98.1 F (36.7 C) (02/19 0430) Pulse Rate:  [68-84] 78 (02/19 0430) Resp:  [12-20] 15 (02/19 0430) BP: (114-134)/(59-74) 116/74 mmHg (02/19 0430) SpO2:  [97 %-100 %] 98 % (02/19 0813) Weight:  [188 lb 1.6 oz (85.322 kg)] 188 lb 1.6 oz (85.322 kg) (02/18 2122) Last BM Date: 10/27/13 Filed Weights   10/25/13 0800 10/26/13 1857 10/27/13 2122  Weight: 189 lb 11.2 oz (86.047 kg) 193 lb 9.6 oz (87.816 kg) 188 lb 1.6 oz (85.322 kg)    Physical Exam: General:  Well appearing. No resp difficulty; sitting in chair HEENT: normal Neck: supple. JVP 8-9 . Carotids 2+ bilat; no bruits. No lymphadenopathy or thryomegaly appreciated. Cor: PMI nondisplaced. Regular rate & rhythm. No rubs, gallops or murmurs. Lungs: clear Abdomen: soft, nontender, nondistended. No hepatosplenomegaly. No bruits or masses. Good bowel sounds. Extremities: no cyanosis, clubbing, rash, 1+ bilateral edema Neuro: alert & orientedx3, cranial nerves grossly intact. moves all 4 extremities w/o difficulty. Affect pleasant  Telemetry: SR 70s  Labs: Basic Metabolic Panel:  Recent Labs Lab 10/25/13 2000  10/26/13 0300 10/26/13 0430 10/26/13 0658 10/26/13 1413 10/27/13 0001 10/27/13 0805 10/28/13 0546  NA  --   < > 143  --  147 145  --  143 144  K  --   < > 3.9  --  4.1 4.0  --  3.1* 3.9  CL  --   < > 113*  --  116* 112  --  107 105  CO2  --   < > 14*  --  15* 18*  --  24 24  GLUCOSE  --   < > 181*  --  193* 146*  --   164* 176*  BUN  --   < > 22  --  20 17  --  14 13  CREATININE  --   < > 1.43*  --  1.45* 1.45*  --  1.30* 1.21*  CALCIUM  --   < > 7.0*  --  7.2* 7.4*  --  7.6* 8.0*  MG 1.7  --   --  2.1  --  2.1 1.9 1.8  --   PHOS 2.2*  --   --  3.0  --  2.3 2.1* 2.1*  --   < > = values in this interval not displayed.  Liver Function Tests: No results found for this basename: AST, ALT, ALKPHOS, BILITOT, PROT, ALBUMIN,  in the last 168 hours No results found for this basename: LIPASE, AMYLASE,  in the last 168 hours No results found for this basename: AMMONIA,  in the last 168 hours  CBC:  Recent Labs Lab 10/25/13 0127  10/25/13 0400 10/25/13 0405 10/26/13 0457 10/27/13 0805 10/28/13 0546  WBC 16.1*  --  13.8*  --  11.4* 7.7 6.3  NEUTROABS 13.8*  --   --   --   --  5.5  --   HGB 11.0*  < > 9.5* 11.6* 8.6* 8.8* 8.8*  HCT 35.3*  < > 30.3* 34.0* 25.5* 25.6* 26.2*  MCV 96.2  --  94.7  --  87.6 86.8 88.2  PLT 342  --  265  --  214 205 203  < > = values in this interval not displayed.  Cardiac Enzymes:  Recent Labs Lab 10/25/13 0127 10/25/13 0400  CKTOTAL  --  25  TROPONINI <0.30  --  BNP: BNP (last 3 results)  Recent Labs  10/28/13 0546  PROBNP 11269.0*    CBG:  Recent Labs Lab 10/27/13 1952 10/27/13 2322 10/28/13 0047 10/28/13 0414 10/28/13 0754  GLUCAP 122* 71 170* 187* 126*    Coagulation Studies: No results found for this basename: LABPROT, INR,  in the last 72 hours    Imaging: Dg Chest 2 View  10/28/2013   CLINICAL DATA:  Cough, clinical pneumonia  EXAM: CHEST  2 VIEW  COMPARISON:  DG CHEST 1V PORT dated 10/26/2013  FINDINGS: The lungs are well-expanded. There is no focal infiltrate. Tiny pleural effusions blunt the costophrenic angles bilaterally. The cardiopericardial silhouette is more normal in size today. The pulmonary vascularity is less engorged. The patient has undergone previous CABG. There is no pneumothorax. The trachea is midline. The observed  portions of the bony thorax exhibit no acute abnormalities.  IMPRESSION: The findings suggest resolving CHF and interstitial edema. There is no alveolar pneumonia. Small bilateral pleural effusions layer posteriorly.   Electronically Signed   By: David  Martinique   On: 10/28/2013 07:47     Assessment:   1) Chronic systolic HF - EF 123456 (0000000) 2) DKA 3) CAD - s/p CABG 4) HTN 5) ICM  Plan/Discussion:    Mrs. Atkins is very well known to the HF team. She has a history of ICM, chronic systolic HF, EF 123456, DM2, HTN, and CAD s/p CABG. She was admitted to the hospital d/t DKA. During this admission has received IVF d/t DKA and now weight is up >10 lbs over baseline. Will give 2 doses of IV lasix 40 mg bid and then transition back to PO tomorrow.  Will restart coreg 6.25 mg BID (previously 12.5 mg BID) and vasotec 2.5 mg daily (previously BID).  Likely can go home in the next 2 days with close follow up in the HF clinic. She has had 2 admissions in the past few months for DKA and needs close follow up with Dr. Buddy Duty Endocrinologist.   Length of Stay: 3 Rande Brunt NP-C 10/28/2013, 10:23 AM  Advanced Heart Failure Team Pager [phone] (M-F; 7a - 4p)  Please contact [facility] for night-coverage after hours (4p -7a ) and weekends on [email or web address]  Patient seen with NP, agree with the above note.   She was admitted with DKA and cardiac meds had been held.  She is now doing better.  She is mildly volume overloaded on exam.   Agree with carefully restarting home meds, gradually titrate up to home doses as BP tolerates.   Will give Lasix 40 mg IV bid today, then 40 mg po daily starting tomorrow.   Loralie Champagne 10/28/2013 12:25 PM

## 2013-10-29 DIAGNOSIS — I10 Essential (primary) hypertension: Secondary | ICD-10-CM

## 2013-10-29 DIAGNOSIS — I5022 Chronic systolic (congestive) heart failure: Secondary | ICD-10-CM

## 2013-10-29 DIAGNOSIS — E119 Type 2 diabetes mellitus without complications: Secondary | ICD-10-CM

## 2013-10-29 LAB — BASIC METABOLIC PANEL
BUN: 9 mg/dL (ref 6–23)
CO2: 29 meq/L (ref 19–32)
Calcium: 8.4 mg/dL (ref 8.4–10.5)
Chloride: 100 mEq/L (ref 96–112)
Creatinine, Ser: 1.09 mg/dL (ref 0.50–1.10)
GFR calc Af Amer: 61 mL/min — ABNORMAL LOW (ref >90)
GFR, EST NON AFRICAN AMERICAN: 53 mL/min — AB (ref >90)
GLUCOSE: 130 mg/dL — AB (ref 70–99)
POTASSIUM: 4.8 meq/L (ref 3.7–5.3)
SODIUM: 143 meq/L (ref 137–147)

## 2013-10-29 LAB — GLUCOSE, CAPILLARY
GLUCOSE-CAPILLARY: 96 mg/dL (ref 70–99)
Glucose-Capillary: 125 mg/dL — ABNORMAL HIGH (ref 70–99)
Glucose-Capillary: 123 mg/dL — ABNORMAL HIGH (ref 70–99)
Glucose-Capillary: 161 mg/dL — ABNORMAL HIGH (ref 70–99)

## 2013-10-29 MED ORDER — FLUCONAZOLE 150 MG PO TABS: 150.0000 mg | ORAL_TABLET | ORAL | Status: DC

## 2013-10-29 MED ORDER — INSULIN DETEMIR 100 UNIT/ML ~~LOC~~ SOLN: 20.0000 [IU] | Freq: Every day | SUBCUTANEOUS | Status: DC

## 2013-10-29 MED ORDER — NYSTATIN 100000 UNIT/GM EX CREA: TOPICAL_CREAM | Freq: Two times a day (BID) | CUTANEOUS | Status: DC

## 2013-10-29 MED ORDER — FUROSEMIDE 40 MG PO TABS: 40.0000 mg | ORAL_TABLET | Freq: Every day | ORAL | Status: DC

## 2013-10-29 MED ORDER — CARVEDILOL 6.25 MG PO TABS: 6.2500 mg | ORAL_TABLET | Freq: Two times a day (BID) | ORAL | Status: DC

## 2013-10-29 NOTE — Care Management Note (Signed)
    Page 1 of 2   10/29/2013     12:34:46 PM   CARE MANAGEMENT NOTE 10/29/2013  Patient:  Bridget Martinez, Bridget Martinez   Account Number:  [id number]  Date Initiated:  10/25/2013  Documentation initiated by:  [facility]  Subjective/Objective Assessment:   DKA - hypotensive.     Action/Plan:   pt eval- rec hhpt   Anticipated DC Date:  10/29/2013   Anticipated DC Plan:  [place]  CM consult      [facility] Choice  HOME HEALTH   Choice offered to / List presented to:  C-1 Patient        [place] arranged  HH-1 RN  [place] PT      [place].   Status of service:  Completed, signed off Medicare Important Message given?   (If response is "NO", the following Medicare IM given date fields will be blank) Date Medicare IM given:   Date Additional Medicare IM given:    Discharge Disposition:  HOME/SELF CARE  Per UR Regulation:  Reviewed for med. necessity/level of care/duration of stay  If discussed at Long Length of Stay Meetings, dates discussed:    Comments:  ContactNeve, Bridget Martinez Spouse [phone]   [phone]                 Bridget Martinez, Bridget Martinez [phone]  10/29/13 [place], BSN [phone] patient is for dc today, patient chose [facility] from agency list, referral made to [facility] , Butch Penny notified for [facility], [place]. Soc will begin 24-48 hrs post discharge.

## 2013-10-29 NOTE — Progress Notes (Signed)
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   Admitted to the hospital 08/24/13 for altered mental status and was found to be severely acidotic and was intubated. Glucose on admission >700, temp 91.8 and BP 72/37. Blood cultures one out of two show coagulase-negative staph, urine cultures showed bacillus species   ECHO 09/21/13: 09/21/13 EF 35-40% with grade II DD (reviewed by Dr. Aundra Dubin)   Recently seen in the HF clinic 1/13 and was doing well. She was supposed to confirm dosages of lasix and enalapril when she returned home, but she never called back. Admitted 10/25/13 for DKA and started on insulin gtt. Minimal SOB. Denies orthopnea or CP. Started on lasix 40 mg IV BID. Weight down 14 lbs, not sure if accurate. Also restarted BB and ACE-I   Objective:   Weight Range:  Vital Signs:   Temp:  [98.2 F (36.8 C)-99.4 F (37.4 C)] 98.3 F (36.8 C) (02/20 0830) Pulse Rate:  [70-88] 75 (02/20 0830) Resp:  [16] 16 (02/20 0830) BP: (113-137)/(57-96) 114/78 mmHg (02/20 0830) SpO2:  [96 %-100 %] 96 % (02/20 0830) Weight:  [174 lb 9.7 oz (79.2 kg)] 174 lb 9.7 oz (79.2 kg) (02/20 0416) Last BM Date: 10/28/13  Weight change: Filed Weights   10/26/13 1857 10/27/13 2122 10/29/13 0416  Weight: 193 lb 9.6 oz (87.816 kg) 188 lb 1.6 oz (85.322 kg) 174 lb 9.7 oz (79.2 kg)    Intake/Output:   Intake/Output Summary (Last 24 hours) at 10/29/13 0916 Last data filed at 10/29/13 0425  Gross per 24 hour  Intake    120 ml  Output    850 ml  Net   -730 ml     Physical Exam: General: Well appearing. No resp difficulty; sitting in chair  HEENT: normal  Neck: supple. JVP flat . Carotids 2+ bilat; no bruits. No lymphadenopathy or thryomegaly  appreciated.  Cor: PMI nondisplaced. Regular rate & rhythm. No rubs, gallops or murmurs.  Lungs: clear  Abdomen: soft, nontender, nondistended. No hepatosplenomegaly. No bruits or masses. Good bowel sounds.  Extremities: no cyanosis, clubbing, rash, 1+ bilateral edema  Neuro: alert & orientedx3, cranial nerves grossly intact. moves all 4 extremities w/o difficulty. Affect pleasant    Telemetry:  SR 70-80s  Labs: Basic Metabolic Panel:  Recent Labs Lab 10/25/13 2000  10/26/13 0430 10/26/13 0658 10/26/13 1413 10/27/13 0001 10/27/13 0805 10/28/13 0546 10/29/13 0431  NA  --   < >  --  147 145  --  143 144 143  K  --   < >  --  4.1 4.0  --  3.1* 3.9 4.8  CL  --   < >  --  116* 112  --  107 105 100  CO2  --   < >  --  15* 18*  --  24 24 29   GLUCOSE  --   < >  --  193* 146*  --  164* 176* 130*  BUN  --   < >  --  20 17  --  14 13 9   CREATININE  --   < >  --  1.45* 1.45*  --  1.30* 1.21* 1.09  CALCIUM  --   < >  --  7.2* 7.4*  --  7.6* 8.0* 8.4  MG 1.7  --  2.1  --  2.1 1.9 1.8  --   --   PHOS 2.2*  --  3.0  --  2.3 2.1* 2.1*  --   --   < > = values in this interval not displayed.  Liver Function Tests: No results found for this basename: AST, ALT, ALKPHOS, BILITOT, PROT, ALBUMIN,  in the last 168 hours No results found for this basename: LIPASE, AMYLASE,  in the last 168 hours No results found for this basename: AMMONIA,  in the last 168 hours  CBC:  Recent Labs Lab 10/25/13 0127  10/25/13 0400 10/25/13 0405 10/26/13 0457 10/27/13 0805 10/28/13 0546  WBC 16.1*  --  13.8*  --  11.4* 7.7 6.3  NEUTROABS 13.8*  --   --   --   --  5.5  --   HGB 11.0*  < > 9.5* 11.6* 8.6* 8.8* 8.8*  HCT 35.3*  < > 30.3* 34.0* 25.5* 25.6* 26.2*  MCV 96.2  --  94.7  --  87.6 86.8 88.2  PLT 342  --  265  --  214 205 203  < > = values in this interval not displayed.  Cardiac Enzymes:  Recent Labs Lab 10/25/13 0127 10/25/13 0400  CKTOTAL  --  25  TROPONINI <0.30  --     BNP: BNP  (last 3 results)  Recent Labs  10/28/13 0546  PROBNP 11269.0*   Imaging: Dg Chest 2 View  10/28/2013   CLINICAL DATA:  Cough, clinical pneumonia  EXAM: CHEST  2 VIEW  COMPARISON:  DG CHEST 1V PORT dated 10/26/2013  FINDINGS: The lungs are well-expanded. There is no focal infiltrate. Tiny pleural effusions blunt the costophrenic angles bilaterally. The cardiopericardial silhouette is more normal in size today. The pulmonary vascularity is less engorged. The patient has undergone previous CABG. There is no pneumothorax. The trachea is midline. The observed portions of the bony thorax exhibit no acute abnormalities.  IMPRESSION: The findings suggest resolving CHF and interstitial edema. There is no alveolar pneumonia. Small bilateral pleural effusions layer posteriorly.   Electronically Signed   By: David  Martinique   On: 10/28/2013 07:47      Medications:     Scheduled Medications: . aspirin EC  325 mg Oral Daily  . atorvastatin  80 mg Oral q1800  . carvedilol  6.25 mg Oral BID WC  . clopidogrel  75 mg Oral Q breakfast  . dextromethorphan-guaiFENesin  1 tablet Oral BID  . enalapril  2.5 mg Oral Daily  . escitalopram  10 mg Oral Daily  . fluconazole  150 mg Oral Daily  . furosemide  40 mg Oral Daily  . heparin  5,000 Units Subcutaneous 3 times per day  . hydrocerin   Topical BID  . insulin aspart  0-15 Units Subcutaneous 6 times per day  . insulin glargine  16 Units Subcutaneous Daily  . ipratropium-albuterol  3 mL Nebulization Q6H  . levothyroxine  125 mcg Oral QAC breakfast  . nystatin cream   Topical BID  . pantoprazole  40 mg Oral Daily  . potassium chloride  10 mEq Oral Daily  . spironolactone  12.5 mg Oral Daily     Infusions:     PRN Medications:  camphor-menthol, dextromethorphan, dextrose   Assessment:   1) Chronic systolic HF  - EF 123456 (09/2013)  2) DKA  3) CAD  - s/p CABG  4) HTN  5) ICM  Plan/Discussion:    Bridget Martinez is very well known to the HF  team. She has a history of ICM, chronic systolic HF, EF 123456, DM2, HTN, and CAD s/p CABG. She was admitted to the hospital d/t DKA. During this admission has received IVF d/t DKA.  Received 40 mg IV lasix BID yesterday. Volume status improved will start back home dose 40 mg daily.   SBP 115-130s. Will increase coreg back to home dose 6.25 mg BID and vastoec to 2.5 mg BID. Can go home today with F/U in HF clinic next week. Will place appointment on chart.   She has had 2 admissions in the past few months for DKA and needs close follow up with Dr. Buddy Duty Endocrinologist.   Length of Stay: 4 Rande Brunt NP-C 10/29/2013, 9:16 AM  Advanced Heart Failure Team Pager [phone] (M-F; 7a - 4p)  Please contact [facility] for night-coverage after hours (4p -7a ) and weekends on [email or web address]  Patient seen and examined with Junie Bame, NP. We discussed all aspects of the encounter. I agree with the assessment and plan as stated above.   Admitted with DKA. HF decompensated with resuscitation.  Volume status much improved. Agree with d/c home today on above meds.Will need close f/u with endocrine. We will see her back in HF Clinic next week.   Kwana Ringel,MD 10:00 AM

## 2013-10-29 NOTE — Progress Notes (Signed)
NURSING PROGRESS NOTE  Bridget Martinez QF:[id number] Discharge Data: 10/29/2013 11:29 AM Attending Provider: Jonetta Osgood, MD [name], [name], MD     [name] to be D/C'd Home per MD order.  Discussed with the patient the After Visit Summary and all questions fully answered. All IV's discontinued with no bleeding noted. All belongings returned to patient for patient to take home.   Last Vital Signs:  Blood pressure 114/78, pulse 75, temperature 98.3 F (36.8 C), temperature source Oral, resp. rate 16, height 5\' 3"  (1.6 m), weight 79.2 kg (174 lb 9.7 oz), SpO2 96.00%.  Discharge Medication List   Medication List    STOP taking these medications       insulin lispro 100 UNIT/ML injection  Commonly known as:  HUMALOG      TAKE these medications       alendronate 70 MG tablet  Commonly known as:  FOSAMAX  Take 1 tablet (70 mg total) by mouth once a week.     aspirin EC 325 MG tablet  Take 325 mg by mouth daily.     carvedilol 6.25 MG tablet  Commonly known as:  COREG  Take 1 tablet (6.25 mg total) by mouth 2 (two) times daily with a meal.     clopidogrel 75 MG tablet  Commonly known as:  PLAVIX  Take 75 mg by mouth daily with breakfast.     enalapril 2.5 MG tablet  Commonly known as:  VASOTEC  Take 1 tablet (2.5 mg total) by mouth 2 (two) times daily.     escitalopram 10 MG tablet  Commonly known as:  LEXAPRO  Take 1 tablet (10 mg total) by mouth daily.     fluconazole 150 MG tablet  Commonly known as:  DIFLUCAN  Take 1 tablet (150 mg total) by mouth once a week.     furosemide 40 MG tablet  Commonly known as:  LASIX  Take 1 tablet (40 mg total) by mouth daily.     insulin aspart 100 UNIT/ML injection  Commonly known as:  novoLOG  Inject 100 Units into the skin 3 (three) times daily with meals. 14 units at breakfast, 12 units at lunch, 7 units supper Plus 1 unit for every 35mg /dL above 100 mg/dL     insulin detemir 100 UNIT/ML injection  Commonly  known as:  LEVEMIR  Inject 0.2 mLs (20 Units total) into the skin daily.     levothyroxine 125 MCG tablet  Commonly known as:  SYNTHROID, LEVOTHROID  Take 1 tablet (125 mcg total) by mouth daily.     nystatin cream  Commonly known as:  MYCOSTATIN  Apply topically 2 (two) times daily.     pantoprazole 40 MG tablet  Commonly known as:  PROTONIX  Take 40 mg by mouth daily.     potassium chloride 10 MEQ tablet  Commonly known as:  K-DUR  Take 1 tablet (10 mEq total) by mouth daily.     rosuvastatin 40 MG tablet  Commonly known as:  CRESTOR  Take 1 tablet (40 mg total) by mouth daily.     spironolactone 25 MG tablet  Commonly known as:  ALDACTONE  Take 12.5 mg by mouth daily.     Vitamin D (Ergocalciferol) 50000 UNITS Caps capsule  Commonly known as:  DRISDOL  Take 1 capsule (50,000 Units total) by mouth every 7 (seven) days.

## 2013-10-29 NOTE — Progress Notes (Signed)
Physical Therapy Treatment Patient Details Name: Bridget Martinez MRN: [id number] DOB: 07-Oct-1950 Today's Date: 10/29/2013 Time: [id number] PT Time Calculation (min): 25 min  PT Assessment / Plan / Recommendation  History of Present Illness 63 years old female with PMH relevant for CAD, post MI, HTN, dyslipidemia,  IDDM with history of DKA. Presents with one day history of severe nausea, vomiting and altered mental status. At admission to the ED found to be in severe DKA   PT Comments   Patient much improved over last session.  Still risk for falls and provided education for fall prevention including use of 3:1 in shower (states has one that was her mother's.)  Agree with HHPT and spouse/son assist intermittent.  Follow Up Recommendations  Home health PT     Does the patient have the potential to tolerate intense rehabilitation   N/A  Barriers to Discharge  None      Equipment Recommendations  None recommended by PT    Recommendations for Other Services  None  Frequency Min 3X/week   Progress towards PT Goals Progress towards PT goals: Progressing toward goals  Plan Current plan remains appropriate    Precautions / Restrictions Precautions Precautions: Fall Precaution Comments: fell prior to admission due to low BP, educated on fall precautions Restrictions Weight Bearing Restrictions: No   Pertinent Vitals/Pain No pain complaints, HR 94 with ambulation   Mobility  Bed Mobility General bed mobility comments: NT sitting edge of bed Transfers Overall transfer level: Needs assistance Equipment used: None Transfers: Sit to/from Stand Sit to Stand: Supervision;Min guard General transfer comment: multiple trials for resting in hallway, assist for safety Ambulation/Gait Ambulation/Gait assistance: Min guard;Supervision Ambulation Distance (Feet): 120 Feet (x 2) Assistive device: None Gait Pattern/deviations: Step-through pattern;Wide base of support;Decreased stride  length General Gait Details: mild unsteadiness with foot edema and general weakness, loss of balance turning left minguard to recover;  Discussed using walker esp for bathroom trips at night due to unsteadiness and risk for falling if more groggy Stairs: Yes Stairs assistance: Min assist Stair Management: One rail Right;Alternating pattern;Step to pattern;Forwards Number of Stairs: 3 General stair comments: step through sequence to ascend with c/o fatigue and weakness, step to sequence to descend with cues for decreased energy expenditure      PT Goals (current goals can now be found in the care plan section)    Visit Information  Last PT Received On: 10/29/13 Assistance Needed: +1 History of Present Illness: 63 years old female with PMH relevant for CAD, post MI, HTN, dyslipidemia,  IDDM with history of DKA. Presents with one day history of severe nausea, vomiting and altered mental status. At admission to the ED found to be in severe DKA    Subjective Data      Cognition  Cognition Arousal/Alertness: Awake/alert Behavior During Therapy: WFL for tasks assessed/performed Overall Cognitive Status: Within Functional Limits for tasks assessed    Balance  Balance Overall balance assessment: History of Falls;Needs assistance Standing balance-Leahy Scale: Fair Standing balance comment: able to stand without support, slightly unsteady walking  End of Session PT - End of Session Equipment Utilized During Treatment: Gait belt Activity Tolerance: Patient tolerated treatment well Patient left: in chair;with call bell/phone within reach   GP     [facility] 10/29/2013, 9:47 AM Magda Kiel, [place] 10/29/2013

## 2013-10-29 NOTE — Discharge Summary (Signed)
Physician Discharge Summary  Bridget Martinez [id number] DOB: 07/24/1951 DOA: 10/25/2013  PCP: [name], MD  Admit date: 10/25/2013 Discharge date: 10/29/2013  Time spent:  50 minutes  Recommendations for Outpatient Follow-up:  Patient will need close follow up with Dr. Buddy Duty as she has had 2 recent admissions for DKA.  This is scheduled for 2/26. CBC / BMET in 1 week.  Patient with anemia and electrolyte abnormalities. HF Clinic on 11/02/13. Please adjust BP meds if needed.  BB was decreased during this admission.  Discharge Diagnoses:  Principal Problem:   DKA (diabetic ketoacidoses) Active Problems:   CHF (congestive heart failure)   Acute bronchitis   HTN (hypertension)   Hyperlipidemia   CAD, multiple vessel   DM type 1 causing renal disease   Altered mental status   Yeast infection of the vagina   Discharge Condition: much improved.  Diet recommendation: carb modified.  Filed Weights   10/26/13 1857 10/27/13 2122 10/29/13 0416  Weight: 87.816 kg (193 lb 9.6 oz) 85.322 kg (188 lb 1.6 oz) 79.2 kg (174 lb 9.7 oz)    History of present illness:  63 year old female with PMH relevant for CAD, post MI, HTN, dyslipidemia, IDDM with history of DKA.  Presents with one day history of severe nausea, vomiting and altered mental status. On admission to the ED found to be in severe DKA.    Hospital Course:   DKA  Admitted in DKA. Treated in ICU on Insulin drip.  Briefly required bi-carb infusion.  Now resolved and on SQ insulin.  Had an episode of mild hypoglycemia 2/18 evening.  Titrating insulin dosing.  Seems to be doing well on Lantus 16 units and Novolog sliding scale as an inpatient.  Will resume Levemir 20 units daily and previous novolog schedule at discharge. Patient sees Dr. Buddy Duty and is considering an insulin pump   Acute Systolic CHF  Patient became short of breath.  Mild pulm vasc congestion on cxr 2/16  Recieived IV lasix bid 2/18, 2/19, and symptoms have  significantly improved.  Cardiology consulted. Patient was further diuresised. LVEF 30 - 35% in 09/2013  Resuming ace, and BB on discharge (BB dose is slightly lower) Will resume PO lasix on 2/20.    Per Heart Failure clinic if patient's weight is above 178 - increase lasix to BID.  Acute Bronchitis  Patient with hacking cough and wheeze - likely viral + aggravated by acute chf. Improving 2/19.  Appears resolved 2/20. Received Vanc and Zosyn briefly in ICU.  Will discontinue antibiotics (due to severe yeast infection) and treat supportively.   Yeast Infection  Vulvo-vaginal yeast infection. Significant  Received IV diflucan will continue with PO at 150 daily and nystatin topical cream as an inpatient-significantly improved at the time of discharge. Will discharge on long term therapy for recurrent infection.  (1 dose weekly for 6 months.) D/C all antibiotics.   HTN / Hypotension  BP Medications were held for the first several days of admission. Coreg and enalapril were added back on 2/19.  Coreg dose was decreased.  Anemia  Hgb dropped from 11.6 >>8.8  Stable for outpatient follow up.  Acute on chronic renal failure  Creatinine at baseline approx 1.5  Creatinine below normal (1.09) on discharge.   Appears to have been helped by diuresis.  Hypothyroidism  Continue synthroid.   Hypokalemia  Replenished with oral supplementation  Mag supplementation as well   Altered mental status  Acute encephalopathy due to DKA  Resolved.  Consultations:  Cardiology  Discharge Exam: Filed Vitals:   10/29/13 0830  BP: 114/78  Pulse: 75  Temp: 98.3 F (36.8 C)  Resp: 16   General: Well developed, well nourished, NAD, appears stated age. Make up freshly applied. HEENT: PERR, EOMI, Anicteic Sclera, MMM. No pharyngeal erythema or exudates  Neck: Supple, no JVD, no masses  Cardiovascular: RRR, S1 S2 auscultated, no rubs, murmurs or gallops.  Respiratory: CTA, no wheeze, crackles or  rals..  Abdomen: Soft, obese, nontender, nondistended, + bowel sounds  Extremities: warm dry without cyanosis clubbing or edema.  Neuro: AAOx3, cranial nerves grossly intact. Strength 5/5 in upper and lower extremities  Skin: Without rashes exudates or nodules.  Psych: Normal affect and demeanor with intact judgement and insight   Discharge Instructions      Discharge Orders   Future Appointments Provider Department Dept Phone   11/02/2013 11:00 AM [facility] [place] [phone]   11/15/2013 3:00 PM Anne Shutter, [place] [phone]   03/28/2014 8:00 AM Lysbeth Penner, FNP [facility] [phone]   Future Orders Complete By Expires   Diet - low sodium heart healthy  As directed    Diet Carb Modified  As directed    Increase activity slowly  As directed        Medication List    STOP taking these medications       insulin lispro 100 UNIT/ML injection  Commonly known as:  HUMALOG      TAKE these medications       alendronate 70 MG tablet  Commonly known as:  FOSAMAX  Take 1 tablet (70 mg total) by mouth once a week.     aspirin EC 325 MG tablet  Take 325 mg by mouth daily.     carvedilol 6.25 MG tablet  Commonly known as:  COREG  Take 1 tablet (6.25 mg total) by mouth 2 (two) times daily with a meal.     clopidogrel 75 MG tablet  Commonly known as:  PLAVIX  Take 75 mg by mouth daily with breakfast.     enalapril 2.5 MG tablet  Commonly known as:  VASOTEC  Take 1 tablet (2.5 mg total) by mouth 2 (two) times daily.     escitalopram 10 MG tablet  Commonly known as:  LEXAPRO  Take 1 tablet (10 mg total) by mouth daily.     fluconazole 150 MG tablet  Commonly known as:  DIFLUCAN  Take 1 tablet (150 mg total) by mouth once a week.     furosemide 40 MG tablet  Commonly known as:  LASIX  Take 1 tablet (40 mg total) by mouth daily.      insulin aspart 100 UNIT/ML injection  Commonly known as:  novoLOG  Inject 100 Units into the skin 3 (three) times daily with meals. 14 units at breakfast, 12 units at lunch, 7 units supper Plus 1 unit for every 35mg /dL above 100 mg/dL     insulin detemir 100 UNIT/ML injection  Commonly known as:  LEVEMIR  Inject 0.2 mLs (20 Units total) into the skin daily.     levothyroxine 125 MCG tablet  Commonly known as:  SYNTHROID, LEVOTHROID  Take 1 tablet (125 mcg total) by mouth daily.     nystatin cream  Commonly known as:  MYCOSTATIN  Apply topically 2 (two) times daily.     pantoprazole 40 MG tablet  Commonly  known as:  PROTONIX  Take 40 mg by mouth daily.     potassium chloride 10 MEQ tablet  Commonly known as:  K-DUR  Take 1 tablet (10 mEq total) by mouth daily.     rosuvastatin 40 MG tablet  Commonly known as:  CRESTOR  Take 1 tablet (40 mg total) by mouth daily.     spironolactone 25 MG tablet  Commonly known as:  ALDACTONE  Take 12.5 mg by mouth daily.     Vitamin D (Ergocalciferol) 50000 UNITS Caps capsule  Commonly known as:  DRISDOL  Take 1 capsule (50,000 Units total) by mouth every 7 (seven) days.       Allergies  Allergen Reactions  . Sulfa Antibiotics Anaphylaxis and Swelling  . Sulfa Antibiotics     Difficulty breathing, stiff neck   Follow-up Information   Follow up with KERR,JEFFREY, MD On 11/04/2013. (Appointment with Dr. Buddy Duty is at 10:20)    Specialty:  Endocrinology   Contact information:   [address] [place] [address] [phone]       Follow up with [name], MD In 3 weeks.   Specialty:  Family Medicine   Contact information:   [address] [place] [address] [phone]       Follow up with [place] On 11/02/2013. (@ 11:00 am; Gate Code 03000; Heart Failure Clinic is located in [facility] )    Specialty:  Cardiology   Contact information:   [address] [id number] Danice Goltz [place] [address] [phone]       The results of significant diagnostics from this hospitalization (including imaging, microbiology, ancillary and laboratory) are listed below for reference.    Significant Diagnostic Studies: Ct Abdomen Pelvis Wo Contrast  10/25/2013   CLINICAL DATA:  Nausea, vomiting, DKA  EXAM: CT ABDOMEN AND PELVIS WITHOUT CONTRAST  TECHNIQUE: Multidetector CT imaging of the abdomen and pelvis was performed following the standard protocol without intravenous contrast.  COMPARISON:  Prior ultrasound from 05/23/2012  FINDINGS: The study is degraded by motion artifact.  The visualized lung bases are grossly clear.  Limited noncontrast evaluation of the liver is unremarkable. Gallbladder is within normal limits. No biliary ductal dilatation. The spleen, adrenal glands, and pancreas demonstrate a normal unenhanced appearance.  The kidneys are equal in size without evidence of nephrolithiasis or hydronephrosis.  The bowels are grossly unremarkable without evidence of obstruction or inflammatory changes. Scattered colonic diverticula are present without acute diverticulitis. Appendix is visualized within the right lower quadrant and is of normal caliber without definite inflammatory changes.  Bladder is decompressed with a Foley catheter in place. Gas lucency within the bladder likely related to catheterization. The uterus and ovaries are within normal limits for patient age.  No free air or fluid identified. No enlarged intra-abdominal pelvic lymph nodes. There is asymmetric mild inflammatory soft tissue stranding within the subcutaneous fat in the left inguinal region adjacent to the left femoral artery (series 2, image 88). Finding is of uncertain significance. No loculated collection.  Moderate atherosclerotic disease seen within the aorta and bilateral iliac arteries.  No acute osseous abnormality identified. Multilevel degenerative changes noted  within the visualized spine. No worrisome lytic or blastic osseous lesions.  IMPRESSION: 1. No CT evidence of acute intra-abdominal or pelvic process. 2. Asymmetric soft tissue stranding within the subcutaneous fat of the left inguinal region adjacent to the left femoral artery. This finding is of uncertain  clinical significance, and may be 3. Colonic diverticulosis without acute diverticulitis. 4. Moderate aorto bi-iliac atherosclerotic disease.   Electronically Signed   By: Jeannine Boga M.D.   On: 10/25/2013 05:42   Dg Chest 2 View  10/28/2013   CLINICAL DATA:  Cough, clinical pneumonia  EXAM: CHEST  2 VIEW  COMPARISON:  DG CHEST 1V PORT dated 10/26/2013  FINDINGS: The lungs are well-expanded. There is no focal infiltrate. Tiny pleural effusions blunt the costophrenic angles bilaterally. The cardiopericardial silhouette is more normal in size today. The pulmonary vascularity is less engorged. The patient has undergone previous CABG. There is no pneumothorax. The trachea is midline. The observed portions of the bony thorax exhibit no acute abnormalities.  IMPRESSION: The findings suggest resolving CHF and interstitial edema. There is no alveolar pneumonia. Small bilateral pleural effusions layer posteriorly.   Electronically Signed   By: David  Martinique   On: 10/28/2013 07:47   Ct Head Wo Contrast  10/25/2013   CLINICAL DATA:  Altered mental status  EXAM: CT HEAD WITHOUT CONTRAST  TECHNIQUE: Contiguous axial images were obtained from the base of the skull through the vertex without intravenous contrast.  COMPARISON:  Prior CT from 05/26/2007  FINDINGS: There is no acute intracranial hemorrhage or infarct. No mass lesion or midline shift. Gray-white matter differentiation is well maintained. Ventricles are normal in size without evidence of hydrocephalus. Cerebral volume within normal limits for patient age. Mild hypoattenuation within the periventricular white matter likely represents very mild chronic  microvascular ischemic changes. No extra-axial fluid collection.  The calvarium is intact. Prominent calcifications subjacent to the inner table of the left frontal calvarium is unchanged.  Orbital soft tissues are within normal limits.  The paranasal sinuses and mastoid air cells are well pneumatized and free of fluid.  Scalp soft tissues are unremarkable.  IMPRESSION: No acute intracranial process.   Electronically Signed   By: Jeannine Boga M.D.   On: 10/25/2013 03:32   Dg Chest Port 1 View  10/26/2013   CLINICAL DATA:  Shortness of breath  EXAM: PORTABLE CHEST - 1 VIEW  COMPARISON:  10/25/2013  FINDINGS: Prior coronary bypass changes noted. Cardiomegaly evident with vascular congestion. Streaky perihilar and bibasilar atelectasis. No definite CHF or pneumonia. Negative for effusion or pneumothorax. Trachea midline.  IMPRESSION: Cardiomegaly with vascular congestion.  Bibasilar atelectasis  No significant interval change.   Electronically Signed   By: Daryll Brod M.D.   On: 10/26/2013 07:42   Dg Chest Portable 1 View  10/25/2013   CLINICAL DATA:  Hyperglycemic, altered mental status  EXAM: PORTABLE CHEST - 1 VIEW  COMPARISON:  DG CHEST 1V PORT dated 10/25/2013; DG CHEST 2 VIEW dated 05/23/2012; DG CHEST 1V PORT dated 11/14/2011  FINDINGS: Heart size upper normal to mildly enlarged but stable. Patient is status post CABG. Vascular pattern within normal limits. There is no consolidation effusion or pulmonary edema.  IMPRESSION: No acute findings.   Electronically Signed   By: Skipper Cliche M.D.   On: 10/25/2013 07:23   Dg Chest Port 1 View  10/25/2013   CLINICAL DATA:  Dyspnea, cough  EXAM: PORTABLE CHEST - 1 VIEW  COMPARISON:  DG CHEST 2 VIEW dated 05/23/2012  FINDINGS: Sternotomy wires overlie normal cardiac silhouette. No effusion, infiltrate, or pneumothorax. No osseous abnormality.  IMPRESSION: No acute cardiopulmonary process.   Electronically Signed   By: Suzy Bouchard M.D.   On: 10/25/2013  01:54    Microbiology: Recent Results (from the past 240  hour(s))  CULTURE, BLOOD (ROUTINE X 2)     Status: None   Collection Time    10/25/13  2:00 AM      Result Value Ref Range Status   Specimen Description BLOOD   Final   Special Requests     Final   Value: RIGHT EJ BOTTLES DRAWN AEROBIC AND ANAEROBIC 10CC EA   Culture  Setup Time     Final   Value: 10/25/2013 08:35     Performed at [facility]   Culture     Final   Value:        BLOOD CULTURE RECEIVED NO GROWTH TO DATE CULTURE WILL BE HELD FOR 5 DAYS BEFORE ISSUING A FINAL NEGATIVE REPORT     Performed at [facility]   Report Status PENDING   Incomplete  CULTURE, BLOOD (ROUTINE X 2)     Status: None   Collection Time    10/25/13  2:25 AM      Result Value Ref Range Status   Specimen Description BLOOD RIGHT HAND   Final   Special Requests BOTTLES DRAWN AEROBIC ONLY 4CC   Final   Culture  Setup Time     Final   Value: 10/25/2013 08:35     Performed at [facility]   Culture     Final   Value:        BLOOD CULTURE RECEIVED NO GROWTH TO DATE CULTURE WILL BE HELD FOR 5 DAYS BEFORE ISSUING A FINAL NEGATIVE REPORT     Performed at [facility]   Report Status PENDING   Incomplete  URINE CULTURE     Status: None   Collection Time    10/25/13  2:57 AM      Result Value Ref Range Status   Specimen Description URINE, RANDOM   Final   Special Requests NONE   Final   Culture  Setup Time     Final   Value: 10/25/2013 03:58     Performed at [place]     Final   Value: NO GROWTH     Performed at [facility]   Culture     Final   Value: NO GROWTH     Performed at [facility]   Report Status 10/25/2013 FINAL   Final  MRSA PCR SCREENING     Status: None   Collection Time    10/25/13  8:02 AM      Result Value Ref Range Status   MRSA by PCR NEGATIVE  NEGATIVE Final   Comment:            The GeneXpert MRSA Assay (FDA     approved for NASAL specimens      only), is one component of a     comprehensive MRSA colonization     surveillance program. It is not     intended to diagnose MRSA     infection nor to guide or     monitor treatment for     MRSA infections.     Labs: Basic Metabolic Panel:  Recent Labs Lab 10/25/13 2000  10/26/13 0430 10/26/13 [id number] 10/26/13 1413 10/27/13 0001 10/27/13 0805 10/28/13 0546 10/29/13 0431  NA  --   < >  --  147 145  --  143 144 143  K  --   < >  --  4.1 4.0  --  3.1* 3.9 4.8  CL  --   < >  --  116* 112  --  107 105 100  CO2  --   < >  --  15* 18*  --  24 24 29   GLUCOSE  --   < >  --  193* 146*  --  164* 176* 130*  BUN  --   < >  --  20 17  --  14 13 9   CREATININE  --   < >  --  1.45* 1.45*  --  1.30* 1.21* 1.09  CALCIUM  --   < >  --  7.2* 7.4*  --  7.6* 8.0* 8.4  MG 1.7  --  2.1  --  2.1 1.9 1.8  --   --   PHOS 2.2*  --  3.0  --  2.3 2.1* 2.1*  --   --   < > = values in this interval not displayed.  CBC:  Recent Labs Lab 10/25/13 0127  10/25/13 0400 10/25/13 0405 10/26/13 0457 10/27/13 0805 10/28/13 0546  WBC 16.1*  --  13.8*  --  11.4* 7.7 6.3  NEUTROABS 13.8*  --   --   --   --  5.5  --   HGB 11.0*  < > 9.5* 11.6* 8.6* 8.8* 8.8*  HCT 35.3*  < > 30.3* 34.0* 25.5* 25.6* 26.2*  MCV 96.2  --  94.7  --  87.6 86.8 88.2  PLT 342  --  265  --  214 205 203  < > = values in this interval not displayed. Cardiac Enzymes:  Recent Labs Lab 10/25/13 0127 10/25/13 0400  CKTOTAL  --  25  TROPONINI <0.30  --    BNP: BNP (last 3 results)  Recent Labs  10/28/13 0546  PROBNP 11269.0*   CBG:  Recent Labs Lab 10/28/13 1730 10/28/13 2017 10/29/13 0013 10/29/13 0418 10/29/13 0829  GLUCAP 111* 186* 96 125* 123*     Signed:  Karen Kitchens [phone]  [facility] 10/29/2013, 11:38 AM  Attending Patient seen and examined, the with the above assessment and plan. Significantly better, she should this remain stable. Seen by cardiology. Stable for  discharge today with the above-noted medications.  Nena Alexander MD

## 2013-10-29 NOTE — Discharge Instructions (Signed)
If your weight is above your "dry weight" of 178 please take lasix 2x daily and call the heart failure clinic [phone]

## 2013-10-31 LAB — CULTURE, BLOOD (ROUTINE X 2)
CULTURE: NO GROWTH
Culture: NO GROWTH

## 2013-11-02 ENCOUNTER — Encounter (HOSPITAL_COMMUNITY): Payer: Self-pay

## 2013-11-02 ENCOUNTER — Inpatient Hospital Stay (HOSPITAL_COMMUNITY): Admit: 2013-11-02 | Payer: BC Managed Care – PPO

## 2013-11-03 ENCOUNTER — Encounter (HOSPITAL_COMMUNITY): Payer: BC Managed Care – PPO

## 2013-11-10 ENCOUNTER — Ambulatory Visit (HOSPITAL_COMMUNITY): Admission: RE | Admit: 2013-11-10 | Payer: BC Managed Care – PPO | Source: Ambulatory Visit

## 2013-11-10 ENCOUNTER — Emergency Department (HOSPITAL_COMMUNITY): Payer: BC Managed Care – PPO

## 2013-11-10 ENCOUNTER — Inpatient Hospital Stay (HOSPITAL_COMMUNITY): Payer: BC Managed Care – PPO

## 2013-11-10 ENCOUNTER — Encounter: Payer: Self-pay | Admitting: Family Medicine

## 2013-11-10 ENCOUNTER — Inpatient Hospital Stay (HOSPITAL_COMMUNITY)
Admission: EM | Admit: 2013-11-10 | Discharge: 2013-11-19 | DRG: 637 | Disposition: A | Discharge status: Home Health | Payer: BC Managed Care – PPO | Attending: Internal Medicine | Admitting: Internal Medicine

## 2013-11-10 ENCOUNTER — Ambulatory Visit (INDEPENDENT_AMBULATORY_CARE_PROVIDER_SITE_OTHER): Payer: BC Managed Care – PPO | Admitting: Family Medicine

## 2013-11-10 VITALS — BP 95/54 | HR 82 | Temp 98.2°F | Ht 63.0 in | Wt 165.0 lb

## 2013-11-10 DIAGNOSIS — K449 Diaphragmatic hernia without obstruction or gangrene: Secondary | ICD-10-CM | POA: Diagnosis present

## 2013-11-10 DIAGNOSIS — F3289 Other specified depressive episodes: Secondary | ICD-10-CM | POA: Diagnosis present

## 2013-11-10 DIAGNOSIS — N189 Chronic kidney disease, unspecified: Secondary | ICD-10-CM

## 2013-11-10 DIAGNOSIS — R5381 Other malaise: Secondary | ICD-10-CM

## 2013-11-10 DIAGNOSIS — E1110 Type 2 diabetes mellitus with ketoacidosis without coma: Secondary | ICD-10-CM

## 2013-11-10 DIAGNOSIS — K219 Gastro-esophageal reflux disease without esophagitis: Secondary | ICD-10-CM | POA: Diagnosis present

## 2013-11-10 DIAGNOSIS — F411 Generalized anxiety disorder: Secondary | ICD-10-CM | POA: Diagnosis present

## 2013-11-10 DIAGNOSIS — J209 Acute bronchitis, unspecified: Secondary | ICD-10-CM

## 2013-11-10 DIAGNOSIS — I5022 Chronic systolic (congestive) heart failure: Secondary | ICD-10-CM

## 2013-11-10 DIAGNOSIS — N179 Acute kidney failure, unspecified: Secondary | ICD-10-CM

## 2013-11-10 DIAGNOSIS — Z79899 Other long term (current) drug therapy: Secondary | ICD-10-CM

## 2013-11-10 DIAGNOSIS — N390 Urinary tract infection, site not specified: Secondary | ICD-10-CM

## 2013-11-10 DIAGNOSIS — R011 Cardiac murmur, unspecified: Secondary | ICD-10-CM | POA: Diagnosis present

## 2013-11-10 DIAGNOSIS — B3731 Acute candidiasis of vulva and vagina: Secondary | ICD-10-CM

## 2013-11-10 DIAGNOSIS — Z9119 Patient's noncompliance with other medical treatment and regimen: Secondary | ICD-10-CM

## 2013-11-10 DIAGNOSIS — Z7982 Long term (current) use of aspirin: Secondary | ICD-10-CM

## 2013-11-10 DIAGNOSIS — R110 Nausea: Secondary | ICD-10-CM | POA: Diagnosis present

## 2013-11-10 DIAGNOSIS — M810 Age-related osteoporosis without current pathological fracture: Secondary | ICD-10-CM | POA: Diagnosis present

## 2013-11-10 DIAGNOSIS — F329 Major depressive disorder, single episode, unspecified: Secondary | ICD-10-CM | POA: Diagnosis present

## 2013-11-10 DIAGNOSIS — I5023 Acute on chronic systolic (congestive) heart failure: Secondary | ICD-10-CM

## 2013-11-10 DIAGNOSIS — N184 Chronic kidney disease, stage 4 (severe): Secondary | ICD-10-CM | POA: Diagnosis present

## 2013-11-10 DIAGNOSIS — B373 Candidiasis of vulva and vagina: Secondary | ICD-10-CM | POA: Diagnosis present

## 2013-11-10 DIAGNOSIS — E1159 Type 2 diabetes mellitus with other circulatory complications: Secondary | ICD-10-CM | POA: Diagnosis present

## 2013-11-10 DIAGNOSIS — Z951 Presence of aortocoronary bypass graft: Secondary | ICD-10-CM

## 2013-11-10 DIAGNOSIS — F32A Depression, unspecified: Secondary | ICD-10-CM

## 2013-11-10 DIAGNOSIS — Z882 Allergy status to sulfonamides status: Secondary | ICD-10-CM

## 2013-11-10 DIAGNOSIS — F339 Major depressive disorder, recurrent, unspecified: Secondary | ICD-10-CM | POA: Diagnosis present

## 2013-11-10 DIAGNOSIS — R109 Unspecified abdominal pain: Secondary | ICD-10-CM

## 2013-11-10 DIAGNOSIS — I252 Old myocardial infarction: Secondary | ICD-10-CM

## 2013-11-10 DIAGNOSIS — I10 Essential (primary) hypertension: Secondary | ICD-10-CM

## 2013-11-10 DIAGNOSIS — I509 Heart failure, unspecified: Secondary | ICD-10-CM | POA: Diagnosis present

## 2013-11-10 DIAGNOSIS — E1065 Type 1 diabetes mellitus with hyperglycemia: Secondary | ICD-10-CM | POA: Diagnosis present

## 2013-11-10 DIAGNOSIS — K3184 Gastroparesis: Secondary | ICD-10-CM | POA: Diagnosis present

## 2013-11-10 DIAGNOSIS — R1011 Right upper quadrant pain: Secondary | ICD-10-CM

## 2013-11-10 DIAGNOSIS — E039 Hypothyroidism, unspecified: Secondary | ICD-10-CM | POA: Diagnosis present

## 2013-11-10 DIAGNOSIS — Z8249 Family history of ischemic heart disease and other diseases of the circulatory system: Secondary | ICD-10-CM

## 2013-11-10 DIAGNOSIS — E1010 Type 1 diabetes mellitus with ketoacidosis without coma: Principal | ICD-10-CM | POA: Diagnosis present

## 2013-11-10 DIAGNOSIS — E785 Hyperlipidemia, unspecified: Secondary | ICD-10-CM

## 2013-11-10 DIAGNOSIS — N183 Chronic kidney disease, stage 3 unspecified: Secondary | ICD-10-CM

## 2013-11-10 DIAGNOSIS — R531 Weakness: Secondary | ICD-10-CM

## 2013-11-10 DIAGNOSIS — I2510 Atherosclerotic heart disease of native coronary artery without angina pectoris: Secondary | ICD-10-CM

## 2013-11-10 DIAGNOSIS — R112 Nausea with vomiting, unspecified: Secondary | ICD-10-CM

## 2013-11-10 DIAGNOSIS — IMO0002 Reserved for concepts with insufficient information to code with codable children: Secondary | ICD-10-CM

## 2013-11-10 DIAGNOSIS — Z7902 Long term (current) use of antithrombotics/antiplatelets: Secondary | ICD-10-CM

## 2013-11-10 DIAGNOSIS — I152 Hypertension secondary to endocrine disorders: Secondary | ICD-10-CM | POA: Diagnosis present

## 2013-11-10 DIAGNOSIS — I2589 Other forms of chronic ischemic heart disease: Secondary | ICD-10-CM | POA: Diagnosis present

## 2013-11-10 DIAGNOSIS — R5383 Other fatigue: Secondary | ICD-10-CM

## 2013-11-10 DIAGNOSIS — R4182 Altered mental status, unspecified: Secondary | ICD-10-CM

## 2013-11-10 DIAGNOSIS — E1049 Type 1 diabetes mellitus with other diabetic neurological complication: Secondary | ICD-10-CM | POA: Diagnosis present

## 2013-11-10 DIAGNOSIS — Z833 Family history of diabetes mellitus: Secondary | ICD-10-CM

## 2013-11-10 DIAGNOSIS — E871 Hypo-osmolality and hyponatremia: Secondary | ICD-10-CM | POA: Diagnosis present

## 2013-11-10 DIAGNOSIS — I130 Hypertensive heart and chronic kidney disease with heart failure and stage 1 through stage 4 chronic kidney disease, or unspecified chronic kidney disease: Secondary | ICD-10-CM | POA: Diagnosis present

## 2013-11-10 DIAGNOSIS — A498 Other bacterial infections of unspecified site: Secondary | ICD-10-CM | POA: Diagnosis present

## 2013-11-10 DIAGNOSIS — E1069 Type 1 diabetes mellitus with other specified complication: Secondary | ICD-10-CM

## 2013-11-10 DIAGNOSIS — Z91199 Patient's noncompliance with other medical treatment and regimen due to unspecified reason: Secondary | ICD-10-CM

## 2013-11-10 DIAGNOSIS — E1143 Type 2 diabetes mellitus with diabetic autonomic (poly)neuropathy: Secondary | ICD-10-CM

## 2013-11-10 DIAGNOSIS — R1031 Right lower quadrant pain: Secondary | ICD-10-CM

## 2013-11-10 LAB — POCT URINALYSIS DIPSTICK
Bilirubin, UA: NEGATIVE
Blood, UA: NEGATIVE
Glucose, UA: NEGATIVE
Nitrite, UA: NEGATIVE
Protein, UA: NEGATIVE
Spec Grav, UA: 1.01
Urobilinogen, UA: NEGATIVE
pH, UA: 5

## 2013-11-10 LAB — POCT CBC
Granulocyte percent: 85.8 %G — AB (ref 37–80)
HCT, POC: 32.1 % — AB (ref 37.7–47.9)
Hemoglobin: 10.2 g/dL — AB (ref 12.2–16.2)
Lymph, poc: 1 (ref 0.6–3.4)
MCH, POC: 28.6 pg (ref 27–31.2)
MCHC: 31.8 g/dL (ref 31.8–35.4)
MCV: 90 fL (ref 80–97)
MPV: 8 fL (ref 0–99.8)
POC Granulocyte: 8 — AB (ref 2–6.9)
POC LYMPH PERCENT: 10.4 %L (ref 10–50)
Platelet Count, POC: 315 10*3/uL (ref 142–424)
RBC: 3.6 M/uL — AB (ref 4.04–5.48)
RDW, POC: 15 %
WBC: 9.3 10*3/uL (ref 4.6–10.2)

## 2013-11-10 LAB — CBC WITH DIFFERENTIAL/PLATELET
Basophils Absolute: 0 10*3/uL (ref 0.0–0.1)
Basophils Relative: 0 % (ref 0–1)
Eosinophils Absolute: 0 10*3/uL (ref 0.0–0.7)
Eosinophils Relative: 0 % (ref 0–5)
HCT: 31.3 % — ABNORMAL LOW (ref 36.0–46.0)
HEMOGLOBIN: 10.5 g/dL — AB (ref 12.0–15.0)
LYMPHS ABS: 0.8 10*3/uL (ref 0.7–4.0)
Lymphocytes Relative: 10 % — ABNORMAL LOW (ref 12–46)
MCH: 30.2 pg (ref 26.0–34.0)
MCHC: 33.5 g/dL (ref 30.0–36.0)
MCV: 89.9 fL (ref 78.0–100.0)
MONO ABS: 0.3 10*3/uL (ref 0.1–1.0)
MONOS PCT: 4 % (ref 3–12)
NEUTROS ABS: 6.7 10*3/uL (ref 1.7–7.7)
NEUTROS PCT: 85 % — AB (ref 43–77)
Platelets: 302 10*3/uL (ref 150–400)
RBC: 3.48 MIL/uL — AB (ref 3.87–5.11)
RDW: 14.8 % (ref 11.5–15.5)
WBC: 7.9 10*3/uL (ref 4.0–10.5)

## 2013-11-10 LAB — COMPREHENSIVE METABOLIC PANEL
ALK PHOS: 190 U/L — AB (ref 39–117)
ALT: 8 U/L (ref 0–35)
AST: 12 U/L (ref 0–37)
Albumin: 2.9 g/dL — ABNORMAL LOW (ref 3.5–5.2)
BUN: 27 mg/dL — AB (ref 6–23)
CO2: 18 mEq/L — ABNORMAL LOW (ref 19–32)
Calcium: 8.8 mg/dL (ref 8.4–10.5)
Chloride: 90 mEq/L — ABNORMAL LOW (ref 96–112)
Creatinine, Ser: 1.1 mg/dL (ref 0.50–1.10)
GFR calc Af Amer: 61 mL/min — ABNORMAL LOW (ref >90)
GFR calc non Af Amer: 52 mL/min — ABNORMAL LOW (ref >90)
Glucose, Bld: 619 mg/dL (ref 70–99)
Potassium: 4.7 mEq/L (ref 3.7–5.3)
SODIUM: 130 meq/L — AB (ref 137–147)
TOTAL PROTEIN: 6.7 g/dL (ref 6.0–8.3)
Total Bilirubin: 0.4 mg/dL (ref 0.3–1.2)

## 2013-11-10 LAB — I-STAT VENOUS BLOOD GAS, ED
ACID-BASE DEFICIT: 6 mmol/L — AB (ref 0.0–2.0)
Bicarbonate: 19.4 mEq/L — ABNORMAL LOW (ref 20.0–24.0)
O2 SAT: 49 %
PCO2 VEN: 36.3 mmHg — AB (ref 45.0–50.0)
TCO2: 20 mmol/L (ref 0–100)
pH, Ven: 7.335 — ABNORMAL HIGH (ref 7.250–7.300)
pO2, Ven: 28 mmHg — CL (ref 30.0–45.0)

## 2013-11-10 LAB — I-STAT CHEM 8, ED
BUN: 27 mg/dL — ABNORMAL HIGH (ref 6–23)
Calcium, Ion: 1.14 mmol/L (ref 1.13–1.30)
Chloride: 96 mEq/L (ref 96–112)
Creatinine, Ser: 1.3 mg/dL — ABNORMAL HIGH (ref 0.50–1.10)
Glucose, Bld: 650 mg/dL (ref 70–99)
HEMATOCRIT: 32 % — AB (ref 36.0–46.0)
HEMOGLOBIN: 10.9 g/dL — AB (ref 12.0–15.0)
Potassium: 4.6 mEq/L (ref 3.7–5.3)
SODIUM: 130 meq/L — AB (ref 137–147)
TCO2: 19 mmol/L (ref 0–100)

## 2013-11-10 LAB — POCT UA - MICROSCOPIC ONLY
Bacteria, U Microscopic: NEGATIVE
Casts, Ur, LPF, POC: NEGATIVE
Crystals, Ur, HPF, POC: NEGATIVE
Mucus, UA: NEGATIVE
RBC, urine, microscopic: NEGATIVE
Yeast, UA: NEGATIVE

## 2013-11-10 LAB — CBG MONITORING, ED
GLUCOSE-CAPILLARY: 549 mg/dL — AB (ref 70–99)
Glucose-Capillary: 591 mg/dL (ref 70–99)
Glucose-Capillary: 511 mg/dL — ABNORMAL HIGH (ref 70–99)
Glucose-Capillary: 298 mg/dL — ABNORMAL HIGH (ref 70–99)

## 2013-11-10 LAB — TROPONIN I

## 2013-11-10 LAB — I-STAT TROPONIN, ED: Troponin i, poc: 0.01 ng/mL (ref 0.00–0.08)

## 2013-11-10 LAB — GLUCOSE, POCT (MANUAL RESULT ENTRY): POC Glucose: 522 mg/dl — AB (ref 70–99)

## 2013-11-10 LAB — KETONES, QUALITATIVE

## 2013-11-10 LAB — I-STAT CG4 LACTIC ACID, ED: LACTIC ACID, VENOUS: 1.57 mmol/L (ref 0.5–2.2)

## 2013-11-10 LAB — LIPASE, BLOOD: LIPASE: 48 U/L (ref 11–59)

## 2013-11-10 MED ORDER — MORPHINE SULFATE 4 MG/ML IJ SOLN
4.0000 mg | Freq: Once | INTRAMUSCULAR | Status: DC
  Filled 2013-11-10: qty 1

## 2013-11-10 MED ORDER — ONDANSETRON HCL 4 MG/2ML IJ SOLN
4.0000 mg | Freq: Four times a day (QID) | INTRAMUSCULAR | Status: DC | PRN
  Administered 2013-11-12 – 2013-11-18 (×3): 4 mg via INTRAVENOUS
  Filled 2013-11-10 (×3): qty 2

## 2013-11-10 MED ORDER — SODIUM CHLORIDE 0.9 % IV SOLN: INTRAVENOUS | Status: DC

## 2013-11-10 MED ORDER — PANTOPRAZOLE SODIUM 40 MG PO TBEC
40.0000 mg | DELAYED_RELEASE_TABLET | Freq: Every day | ORAL | Status: DC
  Administered 2013-11-11 – 2013-11-12 (×2): 40 mg via ORAL
  Filled 2013-11-10 (×2): qty 1

## 2013-11-10 MED ORDER — SODIUM CHLORIDE 0.9 % IV SOLN
INTRAVENOUS | Status: DC
  Administered 2013-11-10: 4.9 [IU]/h via INTRAVENOUS
  Filled 2013-11-10: qty 1

## 2013-11-10 MED ORDER — MORPHINE SULFATE 2 MG/ML IJ SOLN: 2.0000 mg | INTRAMUSCULAR | Status: DC | PRN

## 2013-11-10 MED ORDER — CLOPIDOGREL BISULFATE 75 MG PO TABS
75.0000 mg | ORAL_TABLET | Freq: Every day | ORAL | Status: DC
  Administered 2013-11-11 – 2013-11-19 (×9): 75 mg via ORAL
  Filled 2013-11-10 (×10): qty 1

## 2013-11-10 MED ORDER — LEVOTHYROXINE SODIUM 125 MCG PO TABS
125.0000 ug | ORAL_TABLET | Freq: Every day | ORAL | Status: DC
  Administered 2013-11-11 – 2013-11-19 (×9): 125 ug via ORAL
  Filled 2013-11-10 (×10): qty 1

## 2013-11-10 MED ORDER — ESCITALOPRAM OXALATE 10 MG PO TABS
10.0000 mg | ORAL_TABLET | Freq: Every day | ORAL | Status: DC
  Administered 2013-11-10 – 2013-11-19 (×10): 10 mg via ORAL
  Filled 2013-11-10 (×10): qty 1

## 2013-11-10 MED ORDER — SODIUM CHLORIDE 0.9 % IV SOLN
INTRAVENOUS | Status: AC
  Administered 2013-11-11: 2.4 [IU]/h via INTRAVENOUS
  Administered 2013-11-12: 0.6 [IU]/h via INTRAVENOUS
  Administered 2013-11-12: 0.7 [IU]/h via INTRAVENOUS
  Administered 2013-11-13: 1.5 [IU]/h via INTRAVENOUS
  Administered 2013-11-13: 7.8 [IU]/h via INTRAVENOUS
  Administered 2013-11-13: 0.6 [IU]/h via INTRAVENOUS
  Administered 2013-11-13: 0.9 [IU]/h via INTRAVENOUS
  Administered 2013-11-13: 3.6 [IU]/h via INTRAVENOUS
  Filled 2013-11-10 (×2): qty 1

## 2013-11-10 MED ORDER — ACETAMINOPHEN 325 MG PO TABS
650.0000 mg | ORAL_TABLET | Freq: Four times a day (QID) | ORAL | Status: DC | PRN
Start: 2013-11-10 — End: 2013-11-19

## 2013-11-10 MED ORDER — DEXTROSE-NACL 5-0.45 % IV SOLN
INTRAVENOUS | Status: DC
  Administered 2013-11-10 – 2013-11-13 (×3): via INTRAVENOUS

## 2013-11-10 MED ORDER — CARVEDILOL 6.25 MG PO TABS
6.2500 mg | ORAL_TABLET | Freq: Two times a day (BID) | ORAL | Status: DC
  Administered 2013-11-11 – 2013-11-19 (×16): 6.25 mg via ORAL
  Filled 2013-11-10 (×19): qty 1

## 2013-11-10 MED ORDER — DEXTROSE 50 % IV SOLN
25.0000 mL | INTRAVENOUS | Status: DC | PRN
  Filled 2013-11-10: qty 50

## 2013-11-10 MED ORDER — NYSTATIN 100000 UNIT/GM EX CREA
TOPICAL_CREAM | Freq: Two times a day (BID) | CUTANEOUS | Status: DC
  Administered 2013-11-10 – 2013-11-12 (×5): via TOPICAL
  Administered 2013-11-13: 1 via TOPICAL
  Administered 2013-11-13: 11:00:00 via TOPICAL
  Administered 2013-11-14: 1 via TOPICAL
  Administered 2013-11-14 – 2013-11-19 (×10): via TOPICAL
  Filled 2013-11-10: qty 15

## 2013-11-10 MED ORDER — ASPIRIN EC 325 MG PO TBEC
325.0000 mg | DELAYED_RELEASE_TABLET | Freq: Every day | ORAL | Status: DC
  Administered 2013-11-11 – 2013-11-19 (×9): 325 mg via ORAL
  Filled 2013-11-10 (×9): qty 1

## 2013-11-10 MED ORDER — ONDANSETRON 8 MG PO TBDP: 8.0000 mg | ORAL_TABLET | Freq: Three times a day (TID) | ORAL | Status: DC | PRN

## 2013-11-10 MED ORDER — ENOXAPARIN SODIUM 30 MG/0.3ML ~~LOC~~ SOLN
30.0000 mg | SUBCUTANEOUS | Status: DC
  Administered 2013-11-11: 30 mg via SUBCUTANEOUS
  Filled 2013-11-10: qty 0.3

## 2013-11-10 MED ORDER — INSULIN REGULAR BOLUS VIA INFUSION
0.0000 [IU] | Freq: Three times a day (TID) | INTRAVENOUS | Status: DC
Start: 2013-11-11 — End: 2013-11-13
  Administered 2013-11-12: 3 [IU] via INTRAVENOUS
  Administered 2013-11-12: 3.9 [IU] via INTRAVENOUS
  Administered 2013-11-12: 4.3 [IU] via INTRAVENOUS
  Administered 2013-11-13: 0 [IU] via INTRAVENOUS
  Filled 2013-11-10: qty 10

## 2013-11-10 MED ORDER — FLUCONAZOLE 150 MG PO TABS
150.0000 mg | ORAL_TABLET | ORAL | Status: DC
  Administered 2013-11-13: 150 mg via ORAL
  Filled 2013-11-10: qty 1

## 2013-11-10 MED ORDER — SODIUM CHLORIDE 0.9 % IV SOLN
INTRAVENOUS | Status: DC
  Administered 2013-11-10: 500 mL via INTRAVENOUS

## 2013-11-10 MED ORDER — ONDANSETRON HCL 4 MG/2ML IJ SOLN
4.0000 mg | Freq: Once | INTRAMUSCULAR | Status: AC
  Administered 2013-11-10: 4 mg via INTRAVENOUS
  Filled 2013-11-10: qty 2

## 2013-11-10 NOTE — Progress Notes (Signed)
   Subjective:    Patient ID: Bridget Martinez, female    DOB: 29-Jun-1951, 63 y.o.   MRN: [id number]  HPI This 63 y.o. female presents for evaluation of constant nausea.  She has colicky right upper quadrant Abdominal pain and epigastric discomfort.  She has been nauseated and cannot take her insulin because of hypoglycemia sx's. She has had fsbs this am of 22 and she had to call rescue and she has not taken any insulin since.  She was recently admitted to the hospital for N/V and DKA. She was placed on insulin.  She reports her fsbs have been brittle and this am was low and she has  Not been using any insulin today and she checked it before coming in and it was 199.   Review of Systems C/o weakness, hypoglycemia, nausea, and abdominal pain. No chest pain, SOB, HA, dizziness, vision change, N/V, diarrhea, constipation, dysuria, urinary urgency or frequency, myalgias, arthralgias or rash.     Objective:   Physical Exam Vital signs noted  Well developed well nourished female.  HEENT - Head atraumatic Normocephalic                Eyes - PERRLA, Conjuctiva - clear Sclera- Clear EOMI                Ears - EAC's Wnl TM's Wnl Gross Hearing WNL                Nose - Nares patent                 Throat - oropharanx wnl Respiratory - Lungs CTA bilateral Cardiac - RRR S1 and S2 without murmur GI - Abdomen soft Nontender and bowel sounds active x 4 Extremities - No edema. Neuro - Grossly intact.  Results for orders placed in visit on 11/10/13  POCT CBC      Result Value Ref Range   WBC 9.3  4.6 - 10.2 K/uL   Lymph, poc 1.0  0.6 - 3.4   POC LYMPH PERCENT 10.4  10 - 50 %L   POC Granulocyte 8.0 (*) 2 - 6.9   Granulocyte percent 85.8 (*) 37 - 80 %G   RBC 3.6 (*) 4.04 - 5.48 M/uL   Hemoglobin 10.2 (*) 12.2 - 16.2 g/dL   HCT, POC 32.1 (*) 37.7 - 47.9 %   MCV 90.0  80 - 97 fL   MCH, POC 28.6  27 - 31.2 pg   MCHC 31.8  31.8 - 35.4 g/dL   RDW, POC 15.0     Platelet Count, POC 315.0  142 - 424  K/uL   MPV 8.0  0 - 99.8 fL  GLUCOSE, POCT (MANUAL RESULT ENTRY)      Result Value Ref Range   POC Glucose 522 (*) 70 - 99 mg/dl      Assessment & Plan:  Abdominal pain, unspecified site - Plan: US Abdomen Limited RUQ, POCT CBC, CMP14+EGFR, High sensitivity CRP, C-reactive protein, Lipase, Sedimentation rate, CANCELED: POCT SEDIMENTATION RATE  Weakness - Plan: POCT glucose (manual entry), POCT urinalysis dipstick, POCT UA - Microscopic Only  DKA (diabetic ketoacidoses) - Moderate amount of ketones in urine.  Recommend she go to [facility] ED.  Discussed with [place] charge nurse and patient will go via POV.    Lysbeth Penner FNP

## 2013-11-10 NOTE — ED Notes (Signed)
Patient taken to US.

## 2013-11-10 NOTE — ED Notes (Signed)
Admitting MD at the bedside.  

## 2013-11-10 NOTE — ED Notes (Signed)
Patient back from US.

## 2013-11-10 NOTE — ED Provider Notes (Signed)
CSN: [id number]     Arrival date & time 11/10/13  1553 History   First MD Initiated Contact with Patient 11/10/13 1634     Chief Complaint  Patient presents with  . Emesis  . Diabetic Ketoacidosis     (Consider location/radiation/quality/duration/timing/severity/associated sxs/prior Treatment) HPI Comments: Patient presents with one week history of nausea, vomiting, anorexia and upper abdominal pain. She was seen by her PCP today and referred to the ED with concern for DKA. Multiple previous admissions for DKA. She reports her sugar this morning was 22 for which EMS was not transported. Sugar on arrival to the ED is 591. She reports she's missed a couple doses of her levemir and not eating this week. She denies any fevers, urinary symptoms, chest pain, back pain. She endorses generalized weakness and lightheadedness. The pain is in the right upper quadrant worse with eating. She denies any chest pain or back pain.  The history is provided by the patient.    Past Medical History  Diagnosis Date  . Diabetes mellitus     on insulin, with h/o DKA   . HTN (hypertension)   . Hyperlipidemia   . Venous stasis ulcers   . Angina   . Arthritis   . Anxiety   . Depression   . 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 Heart murmur     Dr Haroldine Laws cardiologist  . Diabetes mellitus without complication   . Hypertension   . Myocardial infarction   . Coronary artery disease   . Pneumonia   . Anginal pain   . GERD (gastroesophageal reflux disease)   . Hypothyroidism   . CHF (congestive heart failure)   . H/O hiatal hernia   . Heart failure   . Tuberculosis   . Osteoporosis    Past Surgical History  Procedure Laterality Date  . Cardiac catheterization    . Tonsillectomy    . Coronary artery bypass graft      Dr. Prescott Gum in 09/2011   . Coronary artery bypass graft  2012   Family History  Problem Relation Age of Onset  . Heart disease Father    . Multiple sclerosis Father   . Hypertension Mother   . Hyperthyroidism Mother   . Diabetes Cousin     Multiple maternal cousins with type 2 diabetes mellitus  . Diabetes Maternal Uncle     Type 1 diabetes mellitus  . Heart attack Paternal Grandfather   . Heart disease Paternal Grandfather    History  Substance Use Topics  . Smoking status: Never Smoker   . Smokeless tobacco: Not on file  . Alcohol Use: No   OB History   Grav Para Term Preterm Abortions TAB SAB Ect Mult Living                 Review of Systems  Constitutional: Positive for activity change, appetite change and fatigue. Negative for fever.  HENT: Negative for congestion.   Respiratory: Negative for cough, chest tightness and shortness of breath.   Cardiovascular: Negative for chest pain.  Gastrointestinal: Positive for nausea, vomiting, abdominal pain and diarrhea.  Genitourinary: Negative for dysuria, hematuria, vaginal bleeding and vaginal discharge.  Musculoskeletal: Negative for arthralgias and back pain.  Skin: Positive for rash.  Neurological: Positive for weakness and light-headedness. Negative for dizziness and headaches.  A complete 10 system review of systems was obtained and all systems are negative except as noted in the  HPI and PMH.      Allergies  Sulfa antibiotics  Home Medications   No current outpatient prescriptions on file. BP 97/31  Pulse 75  Temp(Src) 98.4 F (36.9 C) (Oral)  Resp 10  Ht 5' 3.5" (1.613 m)  Wt 162 lb 11.2 oz (73.8 kg)  BMI 28.37 kg/m2  SpO2 99% Physical Exam  Constitutional: She is oriented to person, place, and time. She appears well-developed and well-nourished. No distress.  HENT:  Head: Normocephalic and atraumatic.  Mouth/Throat: No oropharyngeal exudate.  Dry mucous membranes  Eyes: Conjunctivae are normal. Pupils are equal, round, and reactive to light.  Neck: Normal range of motion. Neck supple.  Cardiovascular: Normal rate, regular rhythm and  normal heart sounds.   Pulmonary/Chest: Effort normal and breath sounds normal. No respiratory distress.  Abdominal: Soft. There is tenderness. There is guarding. There is no rebound.  Mild diffuse tenderness, worse in the right upper quadrant with guarding.  Genitourinary:  Erythematous rash to the groin and perineum, improving per patient, no crepitus  Musculoskeletal: Normal range of motion. She exhibits no edema and no tenderness.  Neurological: She is alert and oriented to person, place, and time. No cranial nerve deficit. She exhibits normal muscle tone. Coordination normal.  Skin: Skin is warm.    ED Course  Procedures (including critical care time) Labs Review Labs Reviewed  COMPREHENSIVE METABOLIC PANEL - Abnormal; Notable for the following:    Sodium 130 (*)    Chloride 90 (*)    CO2 18 (*)    Glucose, Bld 619 (*)    BUN 27 (*)    Albumin 2.9 (*)    Alkaline Phosphatase 190 (*)    GFR calc non Af Amer 52 (*)    GFR calc Af Amer 61 (*)    All other components within normal limits  CBC WITH DIFFERENTIAL - Abnormal; Notable for the following:    RBC 3.48 (*)    Hemoglobin 10.5 (*)    HCT 31.3 (*)    Neutrophils Relative % 85 (*)    Lymphocytes Relative 10 (*)    All other components within normal limits  KETONES, QUALITATIVE - Abnormal; Notable for the following:    Acetone, Bld SMALL (*)    All other components within normal limits  CBG MONITORING, ED - Abnormal; Notable for the following:    Glucose-Capillary 591 (*)    All other components within normal limits  I-STAT CHEM 8, ED - Abnormal; Notable for the following:    Sodium 130 (*)    BUN 27 (*)    Creatinine, Ser 1.30 (*)    Glucose, Bld 650 (*)    Hemoglobin 10.9 (*)    HCT 32.0 (*)    All other components within normal limits  I-STAT VENOUS BLOOD GAS, ED - Abnormal; Notable for the following:    pH, Ven 7.335 (*)    pCO2, Ven 36.3 (*)    pO2, Ven 28.0 (*)    Bicarbonate 19.4 (*)    Acid-base deficit  6.0 (*)    All other components within normal limits  CBG MONITORING, ED - Abnormal; Notable for the following:    Glucose-Capillary 549 (*)    All other components within normal limits  CBG MONITORING, ED - Abnormal; Notable for the following:    Glucose-Capillary 511 (*)    All other components within normal limits  CBG MONITORING, ED - Abnormal; Notable for the following:    Glucose-Capillary 298 (*)  All other components within normal limits  MRSA PCR SCREENING  LIPASE, BLOOD  TROPONIN I  URINALYSIS, ROUTINE W REFLEX MICROSCOPIC  CBC  BASIC METABOLIC PANEL  BASIC METABOLIC PANEL  BASIC METABOLIC PANEL  BASIC METABOLIC PANEL  MAGNESIUM  BASIC METABOLIC PANEL  BASIC METABOLIC PANEL  BASIC METABOLIC PANEL  I-STAT [place], ED  I-STAT CG4 LACTIC ACID, ED   Imaging Review Dg Chest 2 View  11/10/2013   CLINICAL DATA:  Weakness. Nausea vomiting. Diabetic. High blood pressure.  EXAM: CHEST  2 VIEW  COMPARISON:  10/28/2013.  FINDINGS: Post CABG.  Minimal peribronchial thickening and central pulmonary vascular prominence without pulmonary edema, segmental infiltrate or pneumothorax.  Calcified aorta.  IMPRESSION: No evidence of congestive heart failure or pneumonia. Please see above.   Electronically Signed   By: Chauncey Cruel M.D.   On: 11/10/2013 20:22   US Abdomen Limited Ruq  11/10/2013   CLINICAL DATA:  Right upper quadrant pain.  EXAM: US ABDOMEN LIMITED - RIGHT UPPER QUADRANT  COMPARISON:  CT ABD/PELV WO CM dated 10/25/2013  FINDINGS: Gallbladder:  No gallstones or wall thickening visualized. No sonographic Murphy sign noted.  Common bile duct:  Diameter: 4 mm, normal.  Liver:  No focal lesion identified. Within normal limits in parenchymal echogenicity.  IMPRESSION: Negative right upper quadrant ultrasound.   Electronically Signed   By: Dereck Ligas M.D.   On: 11/10/2013 18:13     EKG Interpretation   Date/Time:  Wednesday November 10 2013 16:14:53 EST Ventricular Rate:   77 PR Interval:  134 QRS Duration: 92 QT Interval:  422 QTC Calculation: 477 R Axis:   -12 Text Interpretation:  Normal sinus rhythm Inferior infarct , age  undetermined Anterolateral infarct , age undetermined Abnormal ECG LBBB  resolved Confirmed by Wyvonnia Dusky  MD, Edon Hoadley [phone]) on 11/10/2013 4:49:32 PM      MDM   Final diagnoses:  DKA (diabetic ketoacidoses)    Nausea, vomiting, diarrhea with hyperglycemia and concern for DKA. Labs, IV fluids, IV insulin, right upper quadrant ultrasound  Sugar is 619 with anion gap of 22. IV fluids and IV insulin started. Chest x-ray negative for infiltrate. The right upper quadrant ultrasound negative. No gallstones. Question whether abdominal pain is secondary to DKA.  Chest x-ray is negative. Urinalysis is negative. No evidence of infection. Patient has remained stable in the ED. Blood pressure 1 teens systolic. Pulse 70.  She does have erythema in her groin and perineum which is improving by her report. No crepitus. Admission to stepdown d/w Dr. Broadus John.   CRITICAL CARE Performed by: Ezequiel Essex Total critical care time: 30 Critical care time was exclusive of separately billable procedures and treating other patients. Critical care was necessary to treat or prevent imminent or life-threatening deterioration. Critical care was time spent personally by me on the following activities: development of treatment plan with patient and/or surrogate as well as nursing, discussions with consultants, evaluation of patient's response to treatment, examination of patient, obtaining history from patient or surrogate, ordering and performing treatments and interventions, ordering and review of laboratory studies, ordering and review of radiographic studies, pulse oximetry and re-evaluation of patient's condition.   Ezequiel Essex, MD 11/11/13 0111

## 2013-11-10 NOTE — ED Notes (Signed)
Called [facility] regarding pt's room assignment. Was informed that the room is still dirty and that they will call ED when room is clean.

## 2013-11-10 NOTE — ED Notes (Addendum)
Pt reports she was seen by PCP this AM for N/V/D and RUQ x 1 week; pt sent here for evaluation of ketoacidosis. Pt reports CBG 22 this AM, so didn't take insulin. Hx: Type 1 diabetic

## 2013-11-10 NOTE — ED Notes (Signed)
Bridget Martinez called from lab. Critical value of 650 Blood glucose. Read Back.

## 2013-11-10 NOTE — H&P (Signed)
[facility] History and Physical  LEAMSI POHL [id number] DOB: 1951-05-12 DOA: 11/10/2013  Referring physician: EDP PCP: [name], MD   Chief Complaint: nausea/vomiting, high CBG  HPI: Bridget Martinez is a 63 y.o. female with PMH relevant for CAD, post MI, Chronic systolic CHF, HTN, dyslipidemia, IDDM, 2 recent admissions for DKA, was discharged from [facility] 2/20 after treatment for DKA, also treated then for bronchitis and vulvo-vaginal yeast infection. She reports feeling well for about a week after discharge and then this last week started experiencing, nausea, intermittent vomiting, vague r sided abdominal discomfort. No fevers or chills, no new respiratory symptoms. No chest pain, dyspnea Her PO intake has been poor and hence has cut down her Novolog dose, but reports being compliant with her Levemir. Her CBGs have fluctuated up and down to 22 this am. [name] to her PCPs office, blood work concerning for acidosis again and hence sent to the ER.      Review of Systems:  Constitutional:  No weight loss, night sweats, Fevers, chills, fatigue.  HEENT:  No headaches, Difficulty swallowing,Tooth/dental problems,Sore throat,  No sneezing, itching, ear ache, nasal congestion, post nasal drip,  Cardio-vascular:  No chest pain, Orthopnea, PND, swelling in lower extremities, anasarca, dizziness, palpitations  GI:  No heartburn, indigestion, abdominal pain, nausea, vomiting, diarrhea, change in bowel habits, loss of appetite  Resp:  No shortness of breath with exertion or at rest. No excess mucus, no productive cough, No non-productive cough, No coughing up of blood.No change in color of mucus.No wheezing.No chest wall deformity  Skin:  no rash or lesions.  GU:  no dysuria, change in color of urine, no urgency or frequency. No flank pain.  Musculoskeletal:  No joint pain or swelling. No decreased range of motion. No back pain.  Psych:  No change in mood or affect. No  depression or anxiety. No memory loss.   Past Medical History  Diagnosis Date  . Diabetes mellitus     on insulin, with h/o DKA   . HTN (hypertension)   . Hyperlipidemia   . Venous stasis ulcers   . Angina   . Arthritis   . Anxiety   . Depression   . 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 Heart murmur     Dr Haroldine Laws cardiologist  . Diabetes mellitus without complication   . Hypertension   . Myocardial infarction   . Coronary artery disease   . Pneumonia   . Anginal pain   . GERD (gastroesophageal reflux disease)   . Hypothyroidism   . CHF (congestive heart failure)   . H/O hiatal hernia   . Heart failure   . Tuberculosis   . Osteoporosis    Past Surgical History  Procedure Laterality Date  . Cardiac catheterization    . Tonsillectomy    . Coronary artery bypass graft      Dr. Prescott Gum in 09/2011   . Coronary artery bypass graft  2012   Social History:  reports that she has never smoked. She does not have any smokeless tobacco history on file. She reports that she does not drink alcohol or use illicit drugs.  Allergies  Allergen Reactions  . Sulfa Antibiotics Anaphylaxis and Swelling    Stiff neck also     Family History  Problem Relation Age of Onset  . Heart disease Father   . Multiple sclerosis Father   . Hypertension Mother   .  Hyperthyroidism Mother   . Diabetes Cousin     Multiple maternal cousins with type 2 diabetes mellitus  . Diabetes Maternal Uncle     Type 1 diabetes mellitus  . Heart attack Paternal Grandfather   . Heart disease Paternal Grandfather      Prior to Admission medications   Medication Sig Start Date End Date Taking? Authorizing Provider  alendronate (FOSAMAX) 70 MG tablet Take 1 tablet (70 mg total) by mouth once a week. 09/27/13  Yes Lysbeth Penner, FNP  aspirin EC 325 MG tablet Take 325 mg by mouth daily.   Yes Historical Provider, MD  carvedilol (COREG) 6.25 MG tablet Take 1  tablet (6.25 mg total) by mouth 2 (two) times daily with a meal. 10/29/13  Yes Bobby Rumpf York, PA-C  clopidogrel (PLAVIX) 75 MG tablet Take 75 mg by mouth daily with breakfast.   Yes Historical Provider, MD  enalapril (VASOTEC) 2.5 MG tablet Take 1 tablet (2.5 mg total) by mouth 2 (two) times daily. 10/13/13  Yes Jolaine Artist, MD  escitalopram (LEXAPRO) 10 MG tablet Take 1 tablet (10 mg total) by mouth daily. 09/27/13  Yes Lysbeth Penner, FNP  fluconazole (DIFLUCAN) 150 MG tablet Take 1 tablet (150 mg total) by mouth once a week. 10/29/13  Yes Bobby Rumpf York, PA-C  furosemide (LASIX) 40 MG tablet Take 1 tablet (40 mg total) by mouth daily. 10/29/13  Yes Bobby Rumpf York, PA-C  insulin aspart (NOVOLOG) 100 UNIT/ML injection Inject 100 Units into the skin 3 (three) times daily with meals. 14 units at breakfast, 12 units at lunch, 7 units supper Plus 1 unit for every 35mg /dL above 100 mg/dL   Yes Historical Provider, MD  insulin detemir (LEVEMIR) 100 UNIT/ML injection Inject 0.2 mLs (20 Units total) into the skin daily. 10/29/13  Yes Bobby Rumpf York, PA-C  levothyroxine (SYNTHROID, LEVOTHROID) 125 MCG tablet Take 1 tablet (125 mcg total) by mouth daily. 09/27/13  Yes Lysbeth Penner, FNP  nystatin cream (MYCOSTATIN) Apply topically 2 (two) times daily. 10/29/13  Yes Marianne L York, PA-C  pantoprazole (PROTONIX) 40 MG tablet Take 40 mg by mouth daily. 11/26/11 12/31/13 Yes Shaune Pascal [name], MD  potassium chloride (K-DUR) 10 MEQ tablet Take 1 tablet (10 mEq total) by mouth daily. 07/14/12  Yes Wynetta Emery, PA-C  rosuvastatin (CRESTOR) 40 MG tablet Take 1 tablet (40 mg total) by mouth daily. 09/27/13  Yes Lysbeth Penner, FNP  spironolactone (ALDACTONE) 25 MG tablet Take 12.5 mg by mouth daily.   Yes Historical Provider, MD  Vitamin D, Ergocalciferol, (DRISDOL) 50000 UNITS CAPS capsule Take 1 capsule (50,000 Units total) by mouth every 7 (seven) days. 09/29/13  Yes Lysbeth Penner, FNP  ondansetron  (ZOFRAN ODT) 8 MG disintegrating tablet Take 1 tablet (8 mg total) by mouth every 8 (eight) hours as needed for nausea or vomiting. 11/10/13   Lysbeth Penner, FNP   Physical Exam: Filed Vitals:   11/10/13 1900  BP: 122/41  Pulse: 68  Temp:   Resp: 17    BP 122/41  Pulse 68  Temp(Src) 98.3 F (36.8 C) (Oral)  Resp 17  Ht 5' 3.5" (1.613 m)  Wt 74.844 kg (165 lb)  BMI 28.77 kg/m2  SpO2 100%  General:  Appears calm and comfortable, No distress Eyes: PERRL, normal lids, irises & conjunctiva ENT: oral  Mucosa dry and pink Neck: no LAD, masses or thyromegaly Cardiovascular: RRR, no m/r/g. No LE edema. Telemetry: SR, no  arrhythmias  Respiratory: CTA bilaterally, no w/r/r. Normal respiratory effort. Abdomen: soft, ntnd Skin: no rash or induration seen on limited exam Musculoskeletal: grossly normal tone BUE/BLE Psychiatric: grossly normal mood and affect, speech fluent and appropriate Neurologic: grossly non-focal.          Labs on Admission:  Basic Metabolic Panel:  Recent Labs Lab 11/10/13 1623 11/10/13 1725  NA 130* 130*  K 4.7 4.6  CL 90* 96  CO2 18*  --   GLUCOSE 619* 650*  BUN 27* 27*  CREATININE 1.10 1.30*  CALCIUM 8.8  --    Liver Function Tests:  Recent Labs Lab 11/10/13 1623  AST 12  ALT 8  ALKPHOS 190*  BILITOT 0.4  PROT 6.7  ALBUMIN 2.9*    Recent Labs Lab 11/10/13 1623  LIPASE 48   No results found for this basename: AMMONIA,  in the last 168 hours CBC:  Recent Labs Lab 11/10/13 1428 11/10/13 1623 11/10/13 1725  WBC 9.3 7.9  --   NEUTROABS  --  6.7  --   HGB 10.2* 10.5* 10.9*  HCT 32.1* 31.3* 32.0*  MCV 90.0 89.9  --   PLT  --  302  --    Cardiac Enzymes:  Recent Labs Lab 11/10/13 1712  TROPONINI <0.30    BNP (last 3 results)  Recent Labs  10/28/13 0546  PROBNP 11269.0*   CBG:  Recent Labs Lab 11/10/13 1603 11/10/13 1819 11/10/13 1921  GLUCAP 591* 549* 511*    Radiological Exams on Admission: US Abdomen  Limited Ruq  11/10/2013   CLINICAL DATA:  Right upper quadrant pain.  EXAM: US ABDOMEN LIMITED - RIGHT UPPER QUADRANT  COMPARISON:  CT ABD/PELV WO CM dated 10/25/2013  FINDINGS: Gallbladder:  No gallstones or wall thickening visualized. No sonographic Murphy sign noted.  Common bile duct:  Diameter: 4 mm, normal.  Liver:  No focal lesion identified. Within normal limits in parenchymal echogenicity.  IMPRESSION: Negative right upper quadrant ultrasound.   Electronically Signed   By: Dereck Ligas M.D.   On: 11/10/2013 18:13    EKG: Independently reviewed.NSR, Q waves in inferior leads also noted on EKG from 12/14  Assessment/Plan  1. Recurrent DKA -Admit to SDU -Glucomander protocol, IVF, Bmet Q4, check Mag -Resume Levemir 20units when gap closes -IVF -etiology unclear, RUQ [place] benign, could check Gastric emptying study to rule out gastroparesis as etiology for recurrent N/V  2. Nausea and vomiting -likely due to above at this point -Consider gastric emptying scan once improving from 1  3. Chronic Systolic CHF -EF [id number] based on ECHO 1/15 -continue coreg, hold lasix -monitor Volume status closely  4. Vulvo-vaginal yeast infection. -improving per patient -continue Nystatin and weekly fluconazole  5. CKD -stable  6. Hypothyroidism -continue synthroid  7. Hyponatremia -due to 1, should improve with correction of DKA  DVT proph: lovenox  Code Status: FUll Code Family Communication: no family at bedside Disposition Plan: inpatient  Time spent: 41min  Rmani Kellogg [facility] Pager [phone]

## 2013-11-10 NOTE — ED Notes (Signed)
NOTIFIED DR. RANCOUR IN PERSON OF PATIENTS PANIC LAB RESULTS OF CG4+ LACTIC ACID , I-STAT G3+P VENOUS BLOOD GAS , I-STAT CHEM 8+ @17 :45 PM ,11/10/2013.

## 2013-11-10 NOTE — ED Notes (Signed)
Waiting for insulin from pharmacy.

## 2013-11-11 DIAGNOSIS — N390 Urinary tract infection, site not specified: Secondary | ICD-10-CM | POA: Diagnosis present

## 2013-11-11 DIAGNOSIS — N184 Chronic kidney disease, stage 4 (severe): Secondary | ICD-10-CM | POA: Diagnosis present

## 2013-11-11 DIAGNOSIS — N183 Chronic kidney disease, stage 3 (moderate): Secondary | ICD-10-CM

## 2013-11-11 LAB — BASIC METABOLIC PANEL
BUN: 28 mg/dL — ABNORMAL HIGH (ref 6–23)
BUN: 28 mg/dL — ABNORMAL HIGH (ref 6–23)
BUN: 24 mg/dL — ABNORMAL HIGH (ref 6–23)
BUN: 22 mg/dL (ref 6–23)
BUN: 19 mg/dL (ref 6–23)
BUN: 16 mg/dL (ref 6–23)
CALCIUM: 8.5 mg/dL (ref 8.4–10.5)
CHLORIDE: 99 meq/L (ref 96–112)
CHLORIDE: 98 meq/L (ref 96–112)
CO2: 20 mEq/L (ref 19–32)
CO2: 19 meq/L (ref 19–32)
CO2: 19 meq/L (ref 19–32)
CO2: 17 meq/L — AB (ref 19–32)
CO2: 22 meq/L (ref 19–32)
CO2: 19 meq/L (ref 19–32)
CREATININE: 1.18 mg/dL — AB (ref 0.50–1.10)
CREATININE: 1.19 mg/dL — AB (ref 0.50–1.10)
Calcium: 9.1 mg/dL (ref 8.4–10.5)
Calcium: 8.6 mg/dL (ref 8.4–10.5)
Calcium: 8.2 mg/dL — ABNORMAL LOW (ref 8.4–10.5)
Calcium: 8.7 mg/dL (ref 8.4–10.5)
Calcium: 8.4 mg/dL (ref 8.4–10.5)
Chloride: 98 mEq/L (ref 96–112)
Chloride: 96 mEq/L (ref 96–112)
Chloride: 96 mEq/L (ref 96–112)
Chloride: 96 mEq/L (ref 96–112)
Creatinine, Ser: 0.99 mg/dL (ref 0.50–1.10)
Creatinine, Ser: 0.96 mg/dL (ref 0.50–1.10)
Creatinine, Ser: 1.04 mg/dL (ref 0.50–1.10)
Creatinine, Ser: 0.94 mg/dL (ref 0.50–1.10)
GFR calc Af Amer: 56 mL/min — ABNORMAL LOW (ref >90)
GFR calc Af Amer: 55 mL/min — ABNORMAL LOW (ref >90)
GFR calc Af Amer: 69 mL/min — ABNORMAL LOW (ref >90)
GFR calc Af Amer: 71 mL/min — ABNORMAL LOW (ref >90)
GFR calc Af Amer: 65 mL/min — ABNORMAL LOW (ref >90)
GFR calc Af Amer: 73 mL/min — ABNORMAL LOW (ref >90)
GFR calc non Af Amer: 48 mL/min — ABNORMAL LOW (ref >90)
GFR calc non Af Amer: 48 mL/min — ABNORMAL LOW (ref >90)
GFR calc non Af Amer: 59 mL/min — ABNORMAL LOW (ref >90)
GFR calc non Af Amer: 62 mL/min — ABNORMAL LOW (ref >90)
GFR calc non Af Amer: 56 mL/min — ABNORMAL LOW (ref >90)
GFR calc non Af Amer: 63 mL/min — ABNORMAL LOW (ref >90)
GLUCOSE: 247 mg/dL — AB (ref 70–99)
GLUCOSE: 310 mg/dL — AB (ref 70–99)
GLUCOSE: 107 mg/dL — AB (ref 70–99)
Glucose, Bld: 136 mg/dL — ABNORMAL HIGH (ref 70–99)
Glucose, Bld: 205 mg/dL — ABNORMAL HIGH (ref 70–99)
Glucose, Bld: 222 mg/dL — ABNORMAL HIGH (ref 70–99)
POTASSIUM: 5.2 meq/L (ref 3.7–5.3)
POTASSIUM: 5.3 meq/L (ref 3.7–5.3)
POTASSIUM: 3.8 meq/L (ref 3.7–5.3)
Potassium: 3.5 mEq/L — ABNORMAL LOW (ref 3.7–5.3)
Potassium: 3.3 mEq/L — ABNORMAL LOW (ref 3.7–5.3)
Potassium: 3.5 mEq/L — ABNORMAL LOW (ref 3.7–5.3)
SODIUM: 133 meq/L — AB (ref 137–147)
SODIUM: 133 meq/L — AB (ref 137–147)
Sodium: 137 mEq/L (ref 137–147)
Sodium: 135 mEq/L — ABNORMAL LOW (ref 137–147)
Sodium: 136 mEq/L — ABNORMAL LOW (ref 137–147)
Sodium: 134 mEq/L — ABNORMAL LOW (ref 137–147)

## 2013-11-11 LAB — CBC
HEMATOCRIT: 27 % — AB (ref 36.0–46.0)
Hemoglobin: 9.1 g/dL — ABNORMAL LOW (ref 12.0–15.0)
MCH: 29.4 pg (ref 26.0–34.0)
MCHC: 33.7 g/dL (ref 30.0–36.0)
MCV: 87.4 fL (ref 78.0–100.0)
PLATELETS: 277 10*3/uL (ref 150–400)
RBC: 3.09 MIL/uL — ABNORMAL LOW (ref 3.87–5.11)
RDW: 14.7 % (ref 11.5–15.5)
WBC: 7.3 10*3/uL (ref 4.0–10.5)

## 2013-11-11 LAB — URINE MICROSCOPIC-ADD ON

## 2013-11-11 LAB — GLUCOSE, CAPILLARY
GLUCOSE-CAPILLARY: 137 mg/dL — AB (ref 70–99)
GLUCOSE-CAPILLARY: 268 mg/dL — AB (ref 70–99)
GLUCOSE-CAPILLARY: 126 mg/dL — AB (ref 70–99)
GLUCOSE-CAPILLARY: 70 mg/dL (ref 70–99)
GLUCOSE-CAPILLARY: 259 mg/dL — AB (ref 70–99)
GLUCOSE-CAPILLARY: 235 mg/dL — AB (ref 70–99)
GLUCOSE-CAPILLARY: 97 mg/dL (ref 70–99)
Glucose-Capillary: 133 mg/dL — ABNORMAL HIGH (ref 70–99)
Glucose-Capillary: 151 mg/dL — ABNORMAL HIGH (ref 70–99)
Glucose-Capillary: 167 mg/dL — ABNORMAL HIGH (ref 70–99)
Glucose-Capillary: 168 mg/dL — ABNORMAL HIGH (ref 70–99)
Glucose-Capillary: 243 mg/dL — ABNORMAL HIGH (ref 70–99)
Glucose-Capillary: 108 mg/dL — ABNORMAL HIGH (ref 70–99)
Glucose-Capillary: 67 mg/dL — ABNORMAL LOW (ref 70–99)
Glucose-Capillary: 170 mg/dL — ABNORMAL HIGH (ref 70–99)
Glucose-Capillary: 98 mg/dL (ref 70–99)
Glucose-Capillary: 274 mg/dL — ABNORMAL HIGH (ref 70–99)
Glucose-Capillary: 144 mg/dL — ABNORMAL HIGH (ref 70–99)
Glucose-Capillary: 287 mg/dL — ABNORMAL HIGH (ref 70–99)
Glucose-Capillary: 129 mg/dL — ABNORMAL HIGH (ref 70–99)
Glucose-Capillary: 110 mg/dL — ABNORMAL HIGH (ref 70–99)
Glucose-Capillary: 100 mg/dL — ABNORMAL HIGH (ref 70–99)
Glucose-Capillary: 257 mg/dL — ABNORMAL HIGH (ref 70–99)

## 2013-11-11 LAB — URINALYSIS, ROUTINE W REFLEX MICROSCOPIC
Hgb urine dipstick: NEGATIVE
Ketones, ur: 15 mg/dL — AB
LEUKOCYTES UA: NEGATIVE
NITRITE: NEGATIVE
PH: 5 (ref 5.0–8.0)
Protein, ur: NEGATIVE mg/dL
SPECIFIC GRAVITY, URINE: 1.033 — AB (ref 1.005–1.030)
Urobilinogen, UA: 0.2 mg/dL (ref 0.0–1.0)

## 2013-11-11 LAB — MRSA PCR SCREENING: MRSA by PCR: NEGATIVE

## 2013-11-11 LAB — SEDIMENTATION RATE: Sed Rate: 30 mm/hr (ref 0–40)

## 2013-11-11 LAB — HEMOGLOBIN A1C
HEMOGLOBIN A1C: 10.4 % — AB (ref <5.7)
Mean Plasma Glucose: 252 mg/dL — ABNORMAL HIGH (ref <117)

## 2013-11-11 LAB — MAGNESIUM: Magnesium: 1.5 mg/dL (ref 1.5–2.5)

## 2013-11-11 MED ORDER — DEXTROSE 50 % IV SOLN
INTRAVENOUS | Status: AC
  Filled 2013-11-11: qty 50

## 2013-11-11 MED ORDER — ENOXAPARIN SODIUM 40 MG/0.4ML ~~LOC~~ SOLN
40.0000 mg | SUBCUTANEOUS | Status: DC
  Administered 2013-11-12 – 2013-11-19 (×8): 40 mg via SUBCUTANEOUS
  Filled 2013-11-11 (×8): qty 0.4

## 2013-11-11 MED ORDER — POTASSIUM CHLORIDE CRYS ER 20 MEQ PO TBCR
40.0000 meq | EXTENDED_RELEASE_TABLET | ORAL | Status: DC
  Administered 2013-11-11: 40 meq via ORAL
  Filled 2013-11-11 (×2): qty 2

## 2013-11-11 NOTE — Progress Notes (Signed)
[facility]  Bridget Martinez [id number] DOB: 1951-04-28 DOA: 11/10/2013 PCP: [name], MD  Brief narrative: 63 y.o. female with PMH relevant for CAD, post MI, Chronic systolic CHF, HTN, dyslipidemia, IDDM, 2 recent admissions for DKA, was discharged from [facility] 2/20 after treatment for DKA, also treated then for bronchitis and vulvo-vaginal yeast infection.   She reported feeling well for about a week after discharge and then this past week started experiencing, nausea, intermittent vomiting, vague r sided abdominal discomfort. In ER was found to have hyperglycemia with CBG 619 and an AG of 22. An insulin drip was started.   Assessment/Plan: Active Problems:   DKA (diabetic ketoacidoses) -most recent AG 18 so continue drip -allow sugar free clears -cont drip until serum bicarb >/= 18 and AG <13 -resume home Levemir once AG closed    Abnormal urinalysis -obtain culture -no fever or leukocytosis so no anbx's unless cx positive    CKD (chronic kidney disease), stage III -mild ARF 2/2 DH from DKA -gentle IVF hydration given mod to severe systolic dysfunction    CAD, multiple vessel -no ischemic endorsed -cont Plavix and ASA -cont Coreg as BP tolerates    Chronic systolic congestive heart failure, NYHA class 3 -EF per ECHO Jan 2015 30-35% -hold Lasix and Vasotec    Hypertension -as above -BP soft    Hyperlipidemia    Depression    Yeast infection of the vagina   DVT prophylaxis: Lovenox Code Status: Full Family Communication: No family at bedside Disposition Plan/Expected LOS: Step down   Consultants:  None  Procedures: None  Antibiotics: None  HPI/Subjective: Patient alert. Not complaining of any abdominal pain, nausea vomiting, dysuria, cough or upper respiratory infection type symptoms.  Objective: Blood pressure 118/49, pulse 73, temperature 98.1 F (36.7 C), temperature source Oral, resp. rate 18, height 5'  3.5" (1.613 m), weight 162 lb 11.2 oz (73.8 kg), SpO2 100.00%.  Intake/Output Summary (Last 24 hours) at 11/11/13 1313 Last data filed at 11/11/13 1100  Gross per 24 hour  Intake 1081.63 ml  Output    600 ml  Net 481.63 ml     Exam: General: No acute respiratory distress Lungs: Clear to auscultation bilaterally without wheezes or crackles, RA Cardiovascular: Regular rate and rhythm without murmur gallop or rub normal S1 and S2, no peripheral edema or JVD -IV fluid at 75 cc per hour Abdomen: Nontender, nondistended, soft, bowel sounds positive, no rebound, no ascites, no appreciable mass Musculoskeletal: No significant cyanosis, clubbing of bilateral lower extremities Neurological: Alert and oriented x 3, moves all extremities x 4 without focal neurological deficits, CN 2-12 intact  Scheduled Meds:  Scheduled Meds: . aspirin EC  325 mg Oral Daily  . carvedilol  6.25 mg Oral BID WC  . clopidogrel  75 mg Oral Q breakfast  . dextrose      . [START ON 11/12/2013] enoxaparin (LOVENOX) injection  40 mg Subcutaneous Q24H  . escitalopram  10 mg Oral Daily  . [START ON 11/13/2013] fluconazole  150 mg Oral Q Sat  . insulin regular  0-10 Units Intravenous TID WC  . levothyroxine  125 mcg Oral QAC breakfast  . nystatin cream   Topical BID  . pantoprazole  40 mg Oral Daily  . potassium chloride  40 mEq Oral Q4H   Continuous Infusions: . sodium chloride 100 mL/hr at 11/10/13 2200  . dextrose 5 % and 0.45% NaCl 75 mL/hr at 11/10/13 2300  .  insulin (NOVOLIN-R) infusion 0.4 Units/hr (11/11/13 1247)    Data Reviewed: Basic Metabolic Panel:  Recent Labs Lab 11/10/13 1623 11/10/13 1725 11/10/13 2324 11/11/13 0402 11/11/13 1045  NA 130* 130* 137 135* 133*  K 4.7 4.6 3.5* 3.3* 5.2  CL 90* 96 98 96 96  CO2 18*  --  20 19 19   GLUCOSE 619* 650* 136* 247* 205*  BUN 27* 27* 28* 28* 24*  CREATININE 1.10 1.30* 1.18* 1.19* 0.99  CALCIUM 8.8  --  9.1 8.6 8.5  MG  --   --  1.5  --   --     Liver Function Tests:  Recent Labs Lab 11/10/13 1623  AST 12  ALT 8  ALKPHOS 190*  BILITOT 0.4  PROT 6.7  ALBUMIN 2.9*    Recent Labs Lab 11/10/13 1623  LIPASE 48   No results found for this basename: AMMONIA,  in the last 168 hours CBC:  Recent Labs Lab 11/10/13 1428 11/10/13 1623 11/10/13 1725 11/11/13 0223  WBC 9.3 7.9  --  7.3  NEUTROABS  --  6.7  --   --   HGB 10.2* 10.5* 10.9* 9.1*  HCT 32.1* 31.3* 32.0* 27.0*  MCV 90.0 89.9  --  87.4  PLT  --  302  --  277   Cardiac Enzymes:  Recent Labs Lab 11/10/13 1712  TROPONINI <0.30   BNP (last 3 results)  Recent Labs  10/28/13 0546  PROBNP 11269.0*   CBG:  Recent Labs Lab 11/11/13 0758 11/11/13 0908 11/11/13 1015 11/11/13 1131 11/11/13 1242  GLUCAP 108* 259* 235* 170* 97    Recent Results (from the past 240 hour(s))  MRSA PCR SCREENING     Status: None   Collection Time    11/10/13  9:46 PM      Result Value Ref Range Status   MRSA by PCR NEGATIVE  NEGATIVE Final   Comment:            The GeneXpert MRSA Assay (FDA     approved for NASAL specimens     only), is one component of a     comprehensive MRSA colonization     surveillance program. It is not     intended to diagnose MRSA     infection nor to guide or     monitor treatment for     MRSA infections.     Studies:  Recent x-ray studies have been reviewed in detail by the Attending Physician  Time spent :     Erin Hearing, [place] [facility]  [phone] Pager [phone]  **If unable to reach the above provider after paging please contact the [place] @ [phone]  On-Call/Text Page:      [email or web address]      password [email or web address]  If 7PM-7AM, please contact night-coverage [email or web address] Password [email or web address] 11/11/2013, 1:13 PM   LOS: 1 day       I have examined the patient, reviewed the chart and modified the above note which I agree with.   Sailor Hevia,MD Pager # on [email or web address] 11/11/2013, 3:06 PM

## 2013-11-11 NOTE — Progress Notes (Signed)
Diabetes Coordinator consult: Admitted with DKA after having low blood sugars earlier in the day.  Spoke with patient about her diabetes and her insulin regimen at home.  Patient takes Levemir 20 units daily and Novolog per sliding scale TID with meals.  Has not been able to eat due to N&V. Had taken her Levemir and then was not able to eat.  Had a CBG of 22 mg/dl yesterday. Has had diabetes for 41 years.  States that Dr. Buddy Duty is her endocrinologist.  Concerned about the nausea that she has been having recently. Will need Levemir 20 units and Novolog MODERATE correction scale AC & HS when being transitioned off the glucostabilizer.  Will continue to follow while in hospital.   Harvel Ricks RN BSN CDE

## 2013-11-12 ENCOUNTER — Encounter (HOSPITAL_COMMUNITY): Payer: Self-pay

## 2013-11-12 DIAGNOSIS — F3289 Other specified depressive episodes: Secondary | ICD-10-CM

## 2013-11-12 DIAGNOSIS — I10 Essential (primary) hypertension: Secondary | ICD-10-CM

## 2013-11-12 DIAGNOSIS — I2510 Atherosclerotic heart disease of native coronary artery without angina pectoris: Secondary | ICD-10-CM

## 2013-11-12 DIAGNOSIS — E785 Hyperlipidemia, unspecified: Secondary | ICD-10-CM

## 2013-11-12 DIAGNOSIS — I509 Heart failure, unspecified: Secondary | ICD-10-CM

## 2013-11-12 DIAGNOSIS — N183 Chronic kidney disease, stage 3 unspecified: Secondary | ICD-10-CM

## 2013-11-12 DIAGNOSIS — I5022 Chronic systolic (congestive) heart failure: Secondary | ICD-10-CM

## 2013-11-12 DIAGNOSIS — F329 Major depressive disorder, single episode, unspecified: Secondary | ICD-10-CM

## 2013-11-12 DIAGNOSIS — B3731 Acute candidiasis of vulva and vagina: Secondary | ICD-10-CM

## 2013-11-12 DIAGNOSIS — B373 Candidiasis of vulva and vagina: Secondary | ICD-10-CM

## 2013-11-12 LAB — GLUCOSE, CAPILLARY
GLUCOSE-CAPILLARY: 100 mg/dL — AB (ref 70–99)
GLUCOSE-CAPILLARY: 101 mg/dL — AB (ref 70–99)
GLUCOSE-CAPILLARY: 115 mg/dL — AB (ref 70–99)
GLUCOSE-CAPILLARY: 186 mg/dL — AB (ref 70–99)
GLUCOSE-CAPILLARY: 203 mg/dL — AB (ref 70–99)
GLUCOSE-CAPILLARY: 205 mg/dL — AB (ref 70–99)
GLUCOSE-CAPILLARY: 248 mg/dL — AB (ref 70–99)
GLUCOSE-CAPILLARY: 339 mg/dL — AB (ref 70–99)
GLUCOSE-CAPILLARY: 87 mg/dL (ref 70–99)
Glucose-Capillary: 104 mg/dL — ABNORMAL HIGH (ref 70–99)
Glucose-Capillary: 188 mg/dL — ABNORMAL HIGH (ref 70–99)
Glucose-Capillary: 111 mg/dL — ABNORMAL HIGH (ref 70–99)
Glucose-Capillary: 88 mg/dL (ref 70–99)
Glucose-Capillary: 85 mg/dL (ref 70–99)
Glucose-Capillary: 83 mg/dL (ref 70–99)
Glucose-Capillary: 224 mg/dL — ABNORMAL HIGH (ref 70–99)
Glucose-Capillary: 186 mg/dL — ABNORMAL HIGH (ref 70–99)
Glucose-Capillary: 123 mg/dL — ABNORMAL HIGH (ref 70–99)
Glucose-Capillary: 258 mg/dL — ABNORMAL HIGH (ref 70–99)
Glucose-Capillary: 134 mg/dL — ABNORMAL HIGH (ref 70–99)
Glucose-Capillary: 95 mg/dL (ref 70–99)
Glucose-Capillary: 309 mg/dL — ABNORMAL HIGH (ref 70–99)
Glucose-Capillary: 84 mg/dL (ref 70–99)

## 2013-11-12 LAB — BASIC METABOLIC PANEL
BUN: 15 mg/dL (ref 6–23)
BUN: 11 mg/dL (ref 6–23)
BUN: 10 mg/dL (ref 6–23)
BUN: 8 mg/dL (ref 6–23)
CHLORIDE: 100 meq/L (ref 96–112)
CHLORIDE: 98 meq/L (ref 96–112)
CO2: 21 mEq/L (ref 19–32)
CO2: 17 mEq/L — ABNORMAL LOW (ref 19–32)
CO2: 19 meq/L (ref 19–32)
CO2: 25 mEq/L (ref 19–32)
Calcium: 8.5 mg/dL (ref 8.4–10.5)
Calcium: 8.6 mg/dL (ref 8.4–10.5)
Calcium: 8.4 mg/dL (ref 8.4–10.5)
Calcium: 8.9 mg/dL (ref 8.4–10.5)
Chloride: 100 mEq/L (ref 96–112)
Chloride: 96 mEq/L (ref 96–112)
Creatinine, Ser: 0.91 mg/dL (ref 0.50–1.10)
Creatinine, Ser: 0.84 mg/dL (ref 0.50–1.10)
Creatinine, Ser: 0.87 mg/dL (ref 0.50–1.10)
Creatinine, Ser: 0.91 mg/dL (ref 0.50–1.10)
GFR calc Af Amer: 76 mL/min — ABNORMAL LOW (ref >90)
GFR calc Af Amer: 84 mL/min — ABNORMAL LOW (ref >90)
GFR calc non Af Amer: 66 mL/min — ABNORMAL LOW (ref >90)
GFR calc non Af Amer: 72 mL/min — ABNORMAL LOW (ref >90)
GFR, EST AFRICAN AMERICAN: 80 mL/min — AB (ref >90)
GFR, EST AFRICAN AMERICAN: 76 mL/min — AB (ref >90)
GFR, EST NON AFRICAN AMERICAN: 69 mL/min — AB (ref >90)
GFR, EST NON AFRICAN AMERICAN: 66 mL/min — AB (ref >90)
GLUCOSE: 213 mg/dL — AB (ref 70–99)
Glucose, Bld: 251 mg/dL — ABNORMAL HIGH (ref 70–99)
Glucose, Bld: 289 mg/dL — ABNORMAL HIGH (ref 70–99)
Glucose, Bld: 86 mg/dL (ref 70–99)
POTASSIUM: 3.4 meq/L — AB (ref 3.7–5.3)
POTASSIUM: 3.8 meq/L (ref 3.7–5.3)
Potassium: 3.8 mEq/L (ref 3.7–5.3)
Potassium: 3.9 mEq/L (ref 3.7–5.3)
SODIUM: 137 meq/L (ref 137–147)
Sodium: 137 mEq/L (ref 137–147)
Sodium: 135 mEq/L — ABNORMAL LOW (ref 137–147)
Sodium: 135 mEq/L — ABNORMAL LOW (ref 137–147)

## 2013-11-12 LAB — CMP14+EGFR
ALT: 8 IU/L (ref 0–32)
AST: 14 IU/L (ref 0–40)
Albumin/Globulin Ratio: 1.3 (ref 1.1–2.5)
Albumin: 3.5 g/dL — ABNORMAL LOW (ref 3.6–4.8)
Alkaline Phosphatase: 195 IU/L — ABNORMAL HIGH (ref 39–117)
BUN/Creatinine Ratio: 19 (ref 11–26)
BUN: 25 mg/dL (ref 8–27)
CO2: 17 mmol/L — ABNORMAL LOW (ref 18–29)
Calcium: 8.9 mg/dL (ref 8.7–10.3)
Chloride: 90 mmol/L — ABNORMAL LOW (ref 97–108)
Creatinine, Ser: 1.32 mg/dL — ABNORMAL HIGH (ref 0.57–1.00)
GFR calc Af Amer: 50 mL/min/{1.73_m2} — ABNORMAL LOW (ref >59)
GFR calc non Af Amer: 43 mL/min/{1.73_m2} — ABNORMAL LOW (ref >59)
Globulin, Total: 2.6 g/dL (ref 1.5–4.5)
Glucose: 516 mg/dL (ref 65–99)
Potassium: 4.9 mmol/L (ref 3.5–5.2)
Sodium: 136 mmol/L (ref 134–144)
Total Bilirubin: 0.3 mg/dL (ref 0.0–1.2)
Total Protein: 6.1 g/dL (ref 6.0–8.5)

## 2013-11-12 LAB — HIGH SENSITIVITY CRP: CRP, High Sensitivity: 15.07 mg/L — ABNORMAL HIGH (ref 0.00–3.00)

## 2013-11-12 LAB — C-REACTIVE PROTEIN: CRP: 15.9 mg/L — ABNORMAL HIGH (ref 0.0–4.9)

## 2013-11-12 LAB — LIPASE: Lipase: 50 U/L (ref 0–59)

## 2013-11-12 MED ORDER — SODIUM BICARBONATE 650 MG PO TABS
650.0000 mg | ORAL_TABLET | Freq: Three times a day (TID) | ORAL | Status: AC
  Administered 2013-11-12 – 2013-11-14 (×6): 650 mg via ORAL
  Filled 2013-11-12 (×7): qty 1

## 2013-11-12 MED ORDER — PANTOPRAZOLE SODIUM 40 MG PO TBEC
40.0000 mg | DELAYED_RELEASE_TABLET | Freq: Two times a day (BID) | ORAL | Status: DC
  Administered 2013-11-12 – 2013-11-19 (×13): 40 mg via ORAL
  Filled 2013-11-12 (×10): qty 1

## 2013-11-12 MED ORDER — FUROSEMIDE 40 MG PO TABS
40.0000 mg | ORAL_TABLET | Freq: Every day | ORAL | Status: DC
  Administered 2013-11-12 – 2013-11-14 (×3): 40 mg via ORAL
  Filled 2013-11-12 (×3): qty 1

## 2013-11-12 MED ORDER — SPIRONOLACTONE 12.5 MG HALF TABLET
12.5000 mg | ORAL_TABLET | Freq: Every day | ORAL | Status: DC
Start: 2013-11-12 — End: 2013-11-19
  Administered 2013-11-12 – 2013-11-19 (×8): 12.5 mg via ORAL
  Filled 2013-11-12 (×8): qty 1

## 2013-11-12 NOTE — Progress Notes (Signed)
[facility]  Bridget Martinez [id number] DOB: 12-11-1950 DOA: 11/10/2013 PCP: [name], MD  Brief narrative: 63 y.o. female with PMH relevant for CAD, post MI, Chronic systolic CHF, HTN, dyslipidemia, IDDM, 2 recent admissions for DKA, was discharged from [facility] 2/20 after treatment for DKA, also treated then for bronchitis and vulvo-vaginal yeast infection.   She reported feeling well for about a week after discharge and then this past week started experiencing, nausea, intermittent vomiting, vague r sided abdominal discomfort. In ER was found to have hyperglycemia with CBG 619 and an AG of 22. An insulin drip was started.   Assessment/Plan:   DKA (diabetic ketoacidoses) -most recent AG 16 so will continue drip -allow sugar free clears and start bicarb tablets (component of starvation ketosis contributing to acidosis) -cont drip until serum bicarb >/= 18 and AG <13 -resume home Levemir once AG closed    Abnormal urinalysis -obtain culture -no fever or leukocytosis so no antibiotics unless cx positive    Acute on chronic CKD (chronic kidney disease), stage III -resolved with IVF's -will resume diuretics    CAD, multiple vessel -no ischemic endorsed -cont Coreg, Plavix and ASA    Chronic systolic congestive heart failure, NYHA class 3 -EF per ECHO Jan 2015 30-35% -resume diuretics (Lasix and aldactone) -lisinopril on hold due to worsening renal failure and soft BP -most likely ok to resume in am    Hypertension -stable. -will resume home dose of aldactone and lasix    Hyperlipidemia -continue statins at discharge    Depression: stable continue home antidepressant regimen.    Yeast infection of the vagina: continue fluconazole     Nausea/vomiting: gastroparesis vs worsening GERD. -will switch PPI to BID -will continue PRN antiemetics -will order gastric emptying study   DVT prophylaxis: Lovenox Code Status: Full Family  Communication: husband at bedside Disposition Plan/Expected LOS: remain in Step down   Consultants:  None  Procedures: None  Antibiotics: None  HPI/Subjective: Patient alert. Not complaining of nausea, vomiting, CP or SOB. Reports some mild soreness on her belly.  Objective: Blood pressure 109/42, pulse 77, temperature 98.1 F (36.7 C), temperature source Oral, resp. rate 13, height 5' 3.5" (1.613 m), weight 74.8 kg (164 lb 14.5 oz), SpO2 100.00%.  Intake/Output Summary (Last 24 hours) at 11/12/13 1233 Last data filed at 11/12/13 0800  Gross per 24 hour  Intake   1740 ml  Output   1275 ml  Net    465 ml     Exam: General: No acute respiratory distress, feeling better Lungs: Clear to auscultation bilaterally without wheezes or crackles, good O2 sat on RA Cardiovascular: Regular rate and rhythm without murmur gallop or rub normal S1 and S2, trace peripheral edema; no  JVD -IV fluid at 100 cc per hour Abdomen: Nontender, nondistended, soft, bowel sounds positive, no rebound, no ascites, no appreciable mass Musculoskeletal: No significant cyanosis or clubbing of bilateral lower extremities; trace edema Neurological: Alert and oriented x 3, moves all extremities x 4 without focal neurological deficits, CN 2-12 intact  Scheduled Meds:  Scheduled Meds: . aspirin EC  325 mg Oral Daily  . carvedilol  6.25 mg Oral BID WC  . clopidogrel  75 mg Oral Q breakfast  . enoxaparin (LOVENOX) injection  40 mg Subcutaneous Q24H  . escitalopram  10 mg Oral Daily  . [START ON 11/13/2013] fluconazole  150 mg Oral Q Sat  . furosemide  40 mg Oral  Daily  . insulin regular  0-10 Units Intravenous TID WC  . levothyroxine  125 mcg Oral QAC breakfast  . nystatin cream   Topical BID  . pantoprazole  40 mg Oral BID  . sodium bicarbonate  650 mg Oral TID  . spironolactone  12.5 mg Oral Daily   Continuous Infusions: . sodium chloride Stopped (11/11/13 2024)  . dextrose 5 % and 0.45% NaCl 75 mL/hr  at 11/12/13 0400  . insulin (NOVOLIN-R) infusion Stopped (11/12/13 1209)    Data Reviewed: Basic Metabolic Panel:  Recent Labs Lab 11/10/13 2324  11/11/13 1505 11/11/13 1748 11/11/13 2240 11/12/13 0310 11/12/13 1000  NA 137  < > 133* 136* 134* 137 135*  K 3.5*  < > 5.3 3.8 3.5* 3.4* 3.8  CL 98  < > 96 99 98 100 98  CO2 20  < > 17* 22 19 21  17*  GLUCOSE 136*  < > 310* 107* 222* 213* 251*  BUN 28*  < > 22 19 16 15 11   CREATININE 1.18*  < > 0.96 1.04 0.94 0.91 0.84  CALCIUM 9.1  < > 8.2* 8.7 8.4 8.5 8.6  MG 1.5  --   --   --   --   --   --   < > = values in this interval not displayed. Liver Function Tests:  Recent Labs Lab 11/10/13 1404 11/10/13 1623  AST 14 12  ALT 8 8  ALKPHOS 195* 190*  BILITOT 0.3 0.4  PROT 6.1 6.7  ALBUMIN  --  2.9*    Recent Labs Lab 11/10/13 1404 11/10/13 1623  LIPASE 50 48   CBC:  Recent Labs Lab 11/10/13 1428 11/10/13 1623 11/10/13 1725 11/11/13 0223  WBC 9.3 7.9  --  7.3  NEUTROABS  --  6.7  --   --   HGB 10.2* 10.5* 10.9* 9.1*  HCT 32.1* 31.3* 32.0* 27.0*  MCV 90.0 89.9  --  87.4  PLT  --  302  --  277   Cardiac Enzymes:  Recent Labs Lab 11/10/13 1712  TROPONINI <0.30   BNP (last 3 results)  Recent Labs  10/28/13 0546  PROBNP 11269.0*   CBG:  Recent Labs Lab 11/11/13 1703 11/11/13 1819 11/11/13 1920 11/11/13 2028 11/11/13 2131  GLUCAP 144* 129* 110* 100* 257*    Recent Results (from the past 240 hour(s))  MRSA PCR SCREENING     Status: None   Collection Time    11/10/13  9:46 PM      Result Value Ref Range Status   MRSA by PCR NEGATIVE  NEGATIVE Final   Comment:            The GeneXpert MRSA Assay (FDA     approved for NASAL specimens     only), is one component of a     comprehensive MRSA colonization     surveillance program. It is not     intended to diagnose MRSA     infection nor to guide or     monitor treatment for     MRSA infections.     Zachery Dakins Pager #  [phone] 11/12/2013, 12:33 PM    LOS: 2 days

## 2013-11-12 NOTE — Progress Notes (Signed)
RN notified at 1600 that pt willl not be able to have Gastric emptying test until Monday: she needs to be NPO and this test is not done at weekend.

## 2013-11-13 DIAGNOSIS — E1143 Type 2 diabetes mellitus with diabetic autonomic (poly)neuropathy: Secondary | ICD-10-CM | POA: Diagnosis present

## 2013-11-13 DIAGNOSIS — N390 Urinary tract infection, site not specified: Secondary | ICD-10-CM

## 2013-11-13 DIAGNOSIS — K3184 Gastroparesis: Secondary | ICD-10-CM

## 2013-11-13 DIAGNOSIS — E1149 Type 2 diabetes mellitus with other diabetic neurological complication: Secondary | ICD-10-CM

## 2013-11-13 LAB — BASIC METABOLIC PANEL
BUN: 8 mg/dL (ref 6–23)
BUN: 8 mg/dL (ref 6–23)
BUN: 7 mg/dL (ref 6–23)
BUN: 7 mg/dL (ref 6–23)
CALCIUM: 8.5 mg/dL (ref 8.4–10.5)
CHLORIDE: 100 meq/L (ref 96–112)
CHLORIDE: 97 meq/L (ref 96–112)
CO2: 24 mEq/L (ref 19–32)
CO2: 25 mEq/L (ref 19–32)
CO2: 19 mEq/L (ref 19–32)
CO2: 24 mEq/L (ref 19–32)
Calcium: 8.4 mg/dL (ref 8.4–10.5)
Calcium: 8.3 mg/dL — ABNORMAL LOW (ref 8.4–10.5)
Calcium: 8.5 mg/dL (ref 8.4–10.5)
Chloride: 93 mEq/L — ABNORMAL LOW (ref 96–112)
Chloride: 93 mEq/L — ABNORMAL LOW (ref 96–112)
Creatinine, Ser: 0.98 mg/dL (ref 0.50–1.10)
Creatinine, Ser: 0.97 mg/dL (ref 0.50–1.10)
Creatinine, Ser: 0.97 mg/dL (ref 0.50–1.10)
Creatinine, Ser: 0.95 mg/dL (ref 0.50–1.10)
GFR calc Af Amer: 71 mL/min — ABNORMAL LOW (ref >90)
GFR calc Af Amer: 72 mL/min — ABNORMAL LOW (ref >90)
GFR calc non Af Amer: 61 mL/min — ABNORMAL LOW (ref >90)
GFR calc non Af Amer: 61 mL/min — ABNORMAL LOW (ref >90)
GFR, EST AFRICAN AMERICAN: 70 mL/min — AB (ref >90)
GFR, EST AFRICAN AMERICAN: 71 mL/min — AB (ref >90)
GFR, EST NON AFRICAN AMERICAN: 60 mL/min — AB (ref >90)
GFR, EST NON AFRICAN AMERICAN: 62 mL/min — AB (ref >90)
Glucose, Bld: 126 mg/dL — ABNORMAL HIGH (ref 70–99)
Glucose, Bld: 275 mg/dL — ABNORMAL HIGH (ref 70–99)
Glucose, Bld: 240 mg/dL — ABNORMAL HIGH (ref 70–99)
Glucose, Bld: 256 mg/dL — ABNORMAL HIGH (ref 70–99)
POTASSIUM: 3.3 meq/L — AB (ref 3.7–5.3)
POTASSIUM: 3.2 meq/L — AB (ref 3.7–5.3)
Potassium: 3.9 mEq/L (ref 3.7–5.3)
Potassium: 3.5 mEq/L — ABNORMAL LOW (ref 3.7–5.3)
SODIUM: 141 meq/L (ref 137–147)
SODIUM: 137 meq/L (ref 137–147)
SODIUM: 135 meq/L — AB (ref 137–147)
Sodium: 134 mEq/L — ABNORMAL LOW (ref 137–147)

## 2013-11-13 LAB — URINE CULTURE: Colony Count: >=100000

## 2013-11-13 LAB — GLUCOSE, CAPILLARY
GLUCOSE-CAPILLARY: 191 mg/dL — AB (ref 70–99)
GLUCOSE-CAPILLARY: 230 mg/dL — AB (ref 70–99)
Glucose-Capillary: 121 mg/dL — ABNORMAL HIGH (ref 70–99)
Glucose-Capillary: 123 mg/dL — ABNORMAL HIGH (ref 70–99)
Glucose-Capillary: 152 mg/dL — ABNORMAL HIGH (ref 70–99)
Glucose-Capillary: 161 mg/dL — ABNORMAL HIGH (ref 70–99)
Glucose-Capillary: 206 mg/dL — ABNORMAL HIGH (ref 70–99)
Glucose-Capillary: 256 mg/dL — ABNORMAL HIGH (ref 70–99)
Glucose-Capillary: 320 mg/dL — ABNORMAL HIGH (ref 70–99)
Glucose-Capillary: 88 mg/dL (ref 70–99)
Glucose-Capillary: 95 mg/dL (ref 70–99)
Glucose-Capillary: 98 mg/dL (ref 70–99)
Glucose-Capillary: 98 mg/dL (ref 70–99)

## 2013-11-13 MED ORDER — INSULIN ASPART 100 UNIT/ML ~~LOC~~ SOLN
0.0000 [IU] | Freq: Three times a day (TID) | SUBCUTANEOUS | Status: DC
  Administered 2013-11-13: 2 [IU] via SUBCUTANEOUS
  Administered 2013-11-14: 5 [IU] via SUBCUTANEOUS
  Administered 2013-11-14: 3 [IU] via SUBCUTANEOUS
  Administered 2013-11-14: 1 [IU] via SUBCUTANEOUS
  Administered 2013-11-15: 9 [IU] via SUBCUTANEOUS
  Administered 2013-11-15: 1 [IU] via SUBCUTANEOUS
  Administered 2013-11-15: 8 [IU] via SUBCUTANEOUS
  Administered 2013-11-16: 1 [IU] via SUBCUTANEOUS
  Administered 2013-11-16 (×2): 7 [IU] via SUBCUTANEOUS
  Administered 2013-11-17: 3 [IU] via SUBCUTANEOUS
  Administered 2013-11-17: 9 [IU] via SUBCUTANEOUS
  Administered 2013-11-18 (×3): 2 [IU] via SUBCUTANEOUS
  Administered 2013-11-19: 3 [IU] via SUBCUTANEOUS
  Administered 2013-11-19: 2 [IU] via SUBCUTANEOUS

## 2013-11-13 MED ORDER — DEXTROSE 5 % IV SOLN
1.0000 g | INTRAVENOUS | Status: DC
  Administered 2013-11-13 – 2013-11-15 (×3): 1 g via INTRAVENOUS
  Filled 2013-11-13 (×4): qty 10

## 2013-11-13 MED ORDER — INSULIN ASPART 100 UNIT/ML ~~LOC~~ SOLN: 0.0000 [IU] | Freq: Every day | SUBCUTANEOUS | Status: DC

## 2013-11-13 MED ORDER — METOCLOPRAMIDE HCL 5 MG/ML IJ SOLN
5.0000 mg | Freq: Four times a day (QID) | INTRAMUSCULAR | Status: DC
  Administered 2013-11-13 – 2013-11-14 (×4): 5 mg via INTRAVENOUS
  Filled 2013-11-13 (×7): qty 1

## 2013-11-13 MED ORDER — INSULIN DETEMIR 100 UNIT/ML ~~LOC~~ SOLN
8.0000 [IU] | Freq: Every day | SUBCUTANEOUS | Status: AC
  Administered 2013-11-13: 8 [IU] via SUBCUTANEOUS
  Filled 2013-11-13: qty 0.08

## 2013-11-13 MED ORDER — INSULIN DETEMIR 100 UNIT/ML ~~LOC~~ SOLN
15.0000 [IU] | Freq: Once | SUBCUTANEOUS | Status: AC
  Administered 2013-11-13: 15 [IU] via SUBCUTANEOUS
  Filled 2013-11-13: qty 0.15

## 2013-11-13 NOTE — Progress Notes (Signed)
Dorena Bodo RN assumed care of patient at approximately 1200.

## 2013-11-13 NOTE — Progress Notes (Signed)
[facility]  Bridget Martinez [id number] DOB: Oct 24, 1950 DOA: 11/10/2013 PCP: [name], MD  Brief narrative: 63 y.o. female with PMH relevant for CAD, Chronic systolic CHF, IDDM x 40 years, 2 recent admissions for DKA (last one being 2/20)  She reported feeling well for about a week after discharge and then this past week started experiencing, nausea, intermittent vomiting, vague r sided abdominal discomfort. In ER on 3/7 was found to have hyperglycemia with CBG 619 and an AG of 22. An insulin drip was started  Patient was admitted to the hospitalist service.   Assessment/Plan:   DKA (diabetic ketoacidoses) After several days, have finally been able to wean off insulin drip. Started on Levemir at half her normal dose at lunch time. Diet advanced. Follow sugars. 8 more units tonight. Underlying cause may have been starvation ketosis from nausea and vomiting from diabetic gastroparesis, in addition to a UTI  UTI: Positive for Escherichia coli, resistant to Cipro. Rocephin started.    Acute on chronic CKD (chronic kidney disease), stage III -resolved with IVF's -will resume diuretics  Diabetic gastroparesis: Started on Reglan    CAD, multiple vessel -no ischemic endorsed -cont Coreg, Plavix and ASA    Chronic systolic congestive heart failure, NYHA class 3 -EF per ECHO Jan 2015 30-35% -resume diuretics (Lasix and aldactone) -lisinopril on hold due to worsening renal failure and soft BP -most likely ok to resume in am    Hypertension -stable. -will resume home dose of aldactone and lasix    Hyperlipidemia -continue statins at discharge    Depression: stable continue home antidepressant regimen.    Yeast infection of the vagina: continue fluconazole     Nausea/vomiting: gastroparesis vs worsening GERD. -will switch PPI to BID -will continue PRN antiemetics Started on Reglan   DVT prophylaxis: Lovenox Code Status: Full Family  Communication: husband at bedside Disposition Plan/Expected LOS: remain in Step down, times one more day confirming that sugars stay stable and patient can tolerate by mouth   Consultants:  None  Procedures: None  Antibiotics: IV Rocephin 3/7-3/9  HPI/Subjective: Patient alert. Hungry. No abdominal pain.  Objective: Blood pressure 122/47, pulse 74, temperature 98.6 F (37 C), temperature source Oral, resp. rate 17, height 5' 3.5" (1.613 m), weight 74.8 kg (164 lb 14.5 oz), SpO2 100.00%.  Intake/Output Summary (Last 24 hours) at 11/13/13 1626 Last data filed at 11/13/13 1500  Gross per 24 hour  Intake   2805 ml  Output   2550 ml  Net    255 ml     Exam: General: Alert and oriented x3, no acute distress Lungs: Clear to auscultation bilaterally Cardiovascular: Regular rate and rhythm, S1-S2, clear auscultation bilaterally Abdomen: Soft, nontender, nondistended, positive bowel sounds Musculoskeletal: No significant cyanosis or clubbing of bilateral lower extremities; trace edema Neurological: No focal deficits  Scheduled Meds:  Scheduled Meds: . aspirin EC  325 mg Oral Daily  . carvedilol  6.25 mg Oral BID WC  . clopidogrel  75 mg Oral Q breakfast  . enoxaparin (LOVENOX) injection  40 mg Subcutaneous Q24H  . escitalopram  10 mg Oral Daily  . fluconazole  150 mg Oral Q Sat  . furosemide  40 mg Oral Daily  . insulin aspart  0-5 Units Subcutaneous QHS  . insulin aspart  0-9 Units Subcutaneous TID WC  . levothyroxine  125 mcg Oral QAC breakfast  . metoCLOPramide (REGLAN) injection  5 mg Intravenous 4 times per  day  . nystatin cream   Topical BID  . pantoprazole  40 mg Oral BID  . sodium bicarbonate  650 mg Oral TID  . spironolactone  12.5 mg Oral Daily   Continuous Infusions: . sodium chloride Stopped (11/11/13 2024)  . dextrose 5 % and 0.45% NaCl 75 mL/hr (11/13/13 0700)    Data Reviewed: Basic Metabolic Panel:  Recent Labs Lab 11/10/13 2324  11/12/13 2225  11/13/13 0119 11/13/13 0602 11/13/13 1010 11/13/13 1420  NA 137  < > 135* 141 137 135* 134*  K 3.5*  < > 3.8 3.3* 3.9 3.2* 3.5*  CL 98  < > 96 100 97 93* 93*  CO2 20  < > 25 24 25 19 24   GLUCOSE 136*  < > 86 126* 275* 240* 256*  BUN 28*  < > 8 8 8 7 7   CREATININE 1.18*  < > 0.91 0.98 0.97 0.97 0.95  CALCIUM 9.1  < > 8.9 8.4 8.3* 8.5 8.5  MG 1.5  --   --   --   --   --   --   < > = values in this interval not displayed. Liver Function Tests:  Recent Labs Lab 11/10/13 1404 11/10/13 1623  AST 14 12  ALT 8 8  ALKPHOS 195* 190*  BILITOT 0.3 0.4  PROT 6.1 6.7  ALBUMIN  --  2.9*    Recent Labs Lab 11/10/13 1404 11/10/13 1623  LIPASE 50 48   CBC:  Recent Labs Lab 11/10/13 1428 11/10/13 1623 11/10/13 1725 11/11/13 0223  WBC 9.3 7.9  --  7.3  NEUTROABS  --  6.7  --   --   HGB 10.2* 10.5* 10.9* 9.1*  HCT 32.1* 31.3* 32.0* 27.0*  MCV 90.0 89.9  --  87.4  PLT  --  302  --  277   Cardiac Enzymes:  Recent Labs Lab 11/10/13 1712  TROPONINI <0.30   BNP (last 3 results)  Recent Labs  10/28/13 0546  PROBNP 11269.0*   CBG:  Recent Labs Lab 11/13/13 0732 11/13/13 0855 11/13/13 1005 11/13/13 1155 11/13/13 1315  GLUCAP 121* 230* 320* 98 123*    Recent Results (from the past 240 hour(s))  MRSA PCR SCREENING     Status: None   Collection Time    11/10/13  9:46 PM      Result Value Ref Range Status   MRSA by PCR NEGATIVE  NEGATIVE Final   Comment:            The GeneXpert MRSA Assay (FDA     approved for NASAL specimens     only), is one component of a     comprehensive MRSA colonization     surveillance program. It is not     intended to diagnose MRSA     infection nor to guide or     monitor treatment for     MRSA infections.  URINE CULTURE     Status: None   Collection Time    11/11/13  8:44 PM      Result Value Ref Range Status   Specimen Description URINE, CLEAN CATCH   Final   Special Requests NONE   Final   Culture  Setup Time     Final     Value: 11/12/2013 01:12     Performed at [facility]     Final   Value: >=100,000 COLONIES/ML     Performed at [facility]  Culture     Final   Value: ESCHERICHIA COLI     Performed at [facility]   Report Status 11/13/2013 FINAL   Final   Organism ID, Bacteria ESCHERICHIA COLI   Final      Annita Brod  [facility] Pager [phone]. If 7PM-7AM, please contact night-coverage at [email or web address], password [email or web address] 11/13/2013, 4:39 PM    LOS: 3 days   Time spent: 35 minutes

## 2013-11-13 NOTE — Progress Notes (Signed)
Glucose stabilizer stopped one hour post Levemir administration per MD

## 2013-11-14 DIAGNOSIS — IMO0002 Reserved for concepts with insufficient information to code with codable children: Secondary | ICD-10-CM | POA: Diagnosis present

## 2013-11-14 DIAGNOSIS — E1049 Type 1 diabetes mellitus with other diabetic neurological complication: Secondary | ICD-10-CM

## 2013-11-14 DIAGNOSIS — I5023 Acute on chronic systolic (congestive) heart failure: Secondary | ICD-10-CM

## 2013-11-14 DIAGNOSIS — E1065 Type 1 diabetes mellitus with hyperglycemia: Secondary | ICD-10-CM

## 2013-11-14 LAB — GLUCOSE, CAPILLARY
GLUCOSE-CAPILLARY: 76 mg/dL (ref 70–99)
GLUCOSE-CAPILLARY: 228 mg/dL — AB (ref 70–99)
Glucose-Capillary: 37 mg/dL — CL (ref 70–99)
Glucose-Capillary: 145 mg/dL — ABNORMAL HIGH (ref 70–99)
Glucose-Capillary: 272 mg/dL — ABNORMAL HIGH (ref 70–99)
Glucose-Capillary: 587 mg/dL (ref 70–99)
Glucose-Capillary: 590 mg/dL (ref 70–99)

## 2013-11-14 LAB — BASIC METABOLIC PANEL
BUN: 7 mg/dL (ref 6–23)
CALCIUM: 8.4 mg/dL (ref 8.4–10.5)
CO2: 32 mEq/L (ref 19–32)
CREATININE: 1 mg/dL (ref 0.50–1.10)
Chloride: 96 mEq/L (ref 96–112)
GFR calc non Af Amer: 59 mL/min — ABNORMAL LOW (ref >90)
GFR, EST AFRICAN AMERICAN: 68 mL/min — AB (ref >90)
GLUCOSE: 31 mg/dL — AB (ref 70–99)
Potassium: 3 mEq/L — ABNORMAL LOW (ref 3.7–5.3)
Sodium: 141 mEq/L (ref 137–147)

## 2013-11-14 LAB — PRO B NATRIURETIC PEPTIDE: Pro B Natriuretic peptide (BNP): 1470 pg/mL — ABNORMAL HIGH (ref 0–125)

## 2013-11-14 MED ORDER — METOCLOPRAMIDE HCL 5 MG PO TABS
5.0000 mg | ORAL_TABLET | Freq: Three times a day (TID) | ORAL | Status: DC
  Administered 2013-11-14 – 2013-11-18 (×15): 5 mg via ORAL
  Filled 2013-11-14 (×22): qty 1

## 2013-11-14 MED ORDER — INSULIN ASPART 100 UNIT/ML ~~LOC~~ SOLN
15.0000 [IU] | Freq: Once | SUBCUTANEOUS | Status: AC
  Administered 2013-11-14: 15 [IU] via SUBCUTANEOUS

## 2013-11-14 MED ORDER — FUROSEMIDE 40 MG PO TABS
40.0000 mg | ORAL_TABLET | Freq: Two times a day (BID) | ORAL | Status: DC
  Administered 2013-11-14 – 2013-11-16 (×4): 40 mg via ORAL
  Filled 2013-11-14 (×6): qty 1

## 2013-11-14 MED ORDER — GLUCOSE 40 % PO GEL
ORAL | Status: AC
  Administered 2013-11-14: 37.5 g
  Filled 2013-11-14: qty 1

## 2013-11-14 NOTE — Progress Notes (Signed)
Pt transferred to room 6E 22 from [place] (2600) via wheelchair without difficulty.  Pt A&OX4. VSS. Denies pain. Pleasant.

## 2013-11-14 NOTE — Progress Notes (Signed)
[facility]  Bridget Martinez [id number] DOB: 10-30-1950 DOA: 11/10/2013 PCP: [name], MD  Brief narrative: 63 y.o. female with PMH relevant for CAD, Chronic systolic CHF, IDDM x 40 years, 2 recent admissions for DKA (last one being 2/20)  She reported feeling well for about a week after discharge and then this past week started experiencing, nausea, intermittent vomiting, vague r sided abdominal discomfort. In ER on 3/7 was found to have hyperglycemia with CBG 619 and an AG of 22. An insulin drip was started  Patient was admitted to the hospitalist service. Able to be weaned off of insulin drip.   Assessment/Plan:   DKA (diabetic ketoacidoses) After several days, have finally been able to wean off insulin drip. Started on Levemir at half her normal dose at lunch time. Diet advanced. Follow sugars. 8 more units tonight. Underlying cause may have been starvation ketosis from nausea and vomiting from diabetic gastroparesis, in addition to a UTI.  Had some hypoglycemia this morning 3/8 after getting 10 units of Levemir at noon 3/7 and 8 units last night. Normal home doses 20 units. Will keep that 8 units nightly and see how she does tomorrow.  UTI: Positive for Escherichia coli, resistant to Cipro. Rocephin started.  Would continue this until patient discharged    Acute on chronic CKD (chronic kidney disease), stage III -resolved with IVF's -will resume diuretics  Diabetic gastroparesis: Started on Reglan    CAD, multiple vessel -no ischemic endorsed -cont Coreg, Plavix and ASA   Acute on Chronic systolic congestive heart failure, NYHA class 3 -EF per ECHO Jan 2015 30-35% -resume diuretics (Lasix and aldactone) -lisinopril on hold due to worsening renal failure and soft BP -most likely ok to resume in am BNP elevated so had increased home dose of Lasix, checking daily weights and input/output    Hypertension -stable. -will resume home dose  of aldactone and lasix    Hyperlipidemia -continue statins at discharge    Depression: stable continue home antidepressant regimen.    Yeast infection of the vagina: continue fluconazole     Nausea/vomiting: gastroparesis vs worsening GERD. -will switch PPI to BID -will continue PRN antiemetics Started on Reglan, which seems to have helped significantly   DVT prophylaxis: Lovenox Code Status: Full  Family Communication: Left message for her husband  Disposition Plan/Expected LOS: Transfer to floor. Continue to monitor sugars and diurese.   Consultants:  None  Procedures: None  Antibiotics: IV Rocephin 3/7- present   HPI/Subjective: Patient feeling much better. Tolerating by mouth and she thinks Reglan may have helped.  Objective: Blood pressure 98/65, pulse 84, temperature 98.1 F (36.7 C), temperature source Oral, resp. rate 16, height 5' 3.5" (1.613 m), weight 74.8 kg (164 lb 14.5 oz), SpO2 100.00%.  Intake/Output Summary (Last 24 hours) at 11/14/13 1612 Last data filed at 11/14/13 1400  Gross per 24 hour  Intake    600 ml  Output    350 ml  Net    250 ml     Exam: General: Alert and oriented x3, no acute distress Lungs: Clear to auscultation bilaterally Cardiovascular: Regular rate and rhythm, S1-S2, clear auscultation bilaterally Abdomen: Soft, nontender, nondistended, positive bowel sounds Musculoskeletal: No significant cyanosis or clubbing of bilateral lower extremities; trace edema Neurological: No focal deficits  Scheduled Meds:  Scheduled Meds: . aspirin EC  325 mg Oral Daily  . carvedilol  6.25 mg Oral BID WC  . cefTRIAXone (ROCEPHIN)  IV  1 g Intravenous Q24H  . clopidogrel  75 mg Oral Q breakfast  . enoxaparin (LOVENOX) injection  40 mg Subcutaneous Q24H  . escitalopram  10 mg Oral Daily  . fluconazole  150 mg Oral Q Sat  . furosemide  40 mg Oral BID  . insulin aspart  0-5 Units Subcutaneous QHS  . insulin aspart  0-9 Units Subcutaneous  TID WC  . levothyroxine  125 mcg Oral QAC breakfast  . metoCLOPramide  5 mg Oral TID AC & HS  . nystatin cream   Topical BID  . pantoprazole  40 mg Oral BID  . spironolactone  12.5 mg Oral Daily   Continuous Infusions:    Data Reviewed: Basic Metabolic Panel:  Recent Labs Lab 11/10/13 2324  11/13/13 0119 11/13/13 0602 11/13/13 1010 11/13/13 1420 11/14/13 0405  NA 137  < > 141 137 135* 134* 141  K 3.5*  < > 3.3* 3.9 3.2* 3.5* 3.0*  CL 98  < > 100 97 93* 93* 96  CO2 20  < > 24 25 19 24  32  GLUCOSE 136*  < > 126* 275* 240* 256* 31*  BUN 28*  < > 8 8 7 7 7   CREATININE 1.18*  < > 0.98 0.97 0.97 0.95 1.00  CALCIUM 9.1  < > 8.4 8.3* 8.5 8.5 8.4  MG 1.5  --   --   --   --   --   --   < > = values in this interval not displayed. Liver Function Tests:  Recent Labs Lab 11/10/13 1404 11/10/13 1623  AST 14 12  ALT 8 8  ALKPHOS 195* 190*  BILITOT 0.3 0.4  PROT 6.1 6.7  ALBUMIN  --  2.9*    Recent Labs Lab 11/10/13 1404 11/10/13 1623  LIPASE 50 48   CBC:  Recent Labs Lab 11/10/13 1428 11/10/13 1623 11/10/13 1725 11/11/13 0223  WBC 9.3 7.9  --  7.3  NEUTROABS  --  6.7  --   --   HGB 10.2* 10.5* 10.9* 9.1*  HCT 32.1* 31.3* 32.0* 27.0*  MCV 90.0 89.9  --  87.4  PLT  --  302  --  277   Cardiac Enzymes:  Recent Labs Lab 11/10/13 1712  TROPONINI <0.30   BNP (last 3 results)  Recent Labs  10/28/13 0546 11/14/13 0405  PROBNP 11269.0* 1470.0*   CBG:  Recent Labs Lab 11/13/13 2133 11/14/13 0535 11/14/13 0607 11/14/13 0811 11/14/13 1146  GLUCAP 98 37* 76 228* 145*    Recent Results (from the past 240 hour(s))  MRSA PCR SCREENING     Status: None   Collection Time    11/10/13  9:46 PM      Result Value Ref Range Status   MRSA by PCR NEGATIVE  NEGATIVE Final   Comment:            The GeneXpert MRSA Assay (FDA     approved for NASAL specimens     only), is one component of a     comprehensive MRSA colonization     surveillance program. It is  not     intended to diagnose MRSA     infection nor to guide or     monitor treatment for     MRSA infections.  URINE CULTURE     Status: None   Collection Time    11/11/13  8:44 PM      Result Value Ref Range Status   Specimen  Description URINE, CLEAN CATCH   Final   Special Requests NONE   Final   Culture  Setup Time     Final   Value: 11/12/2013 01:12     Performed at [place]     Final   Value: >=100,000 COLONIES/ML     Performed at [facility]   Culture     Final   Value: ESCHERICHIA COLI     Performed at [facility]   Report Status 11/13/2013 FINAL   Final   Organism ID, Bacteria ESCHERICHIA COLI   Final      [facility] Pager [phone]. If 7PM-7AM, please contact night-coverage at [email or web address], password [facility] 11/14/2013, 4:12 PM    LOS: 4 days   Time spent: 25 minutes

## 2013-11-14 NOTE — Progress Notes (Signed)
Pt has CBG of 590. Asymptomatic, no distress at this time. Fredirick Maudlin notified. Awaiting response.

## 2013-11-14 NOTE — Progress Notes (Signed)
CRITICAL VALUE ALERT  Critical value received:  Low Blood Sugar  Date of notification:  11/14/13   Time of notification:  0515  Critical value read back:YES  Nurse who received alert:  RG  MD notified (1st page):    Time of first page:   MD notified (2nd page):  Time of second page:  Responding MD:    Time MD responded:

## 2013-11-15 ENCOUNTER — Inpatient Hospital Stay (HOSPITAL_COMMUNITY): Payer: BC Managed Care – PPO

## 2013-11-15 ENCOUNTER — Ambulatory Visit: Payer: BC Managed Care – PPO | Admitting: *Deleted

## 2013-11-15 DIAGNOSIS — N179 Acute kidney failure, unspecified: Secondary | ICD-10-CM | POA: Diagnosis present

## 2013-11-15 DIAGNOSIS — N189 Chronic kidney disease, unspecified: Secondary | ICD-10-CM

## 2013-11-15 LAB — GLUCOSE, CAPILLARY
GLUCOSE-CAPILLARY: 133 mg/dL — AB (ref 70–99)
GLUCOSE-CAPILLARY: 134 mg/dL — AB (ref 70–99)
GLUCOSE-CAPILLARY: 257 mg/dL — AB (ref 70–99)
Glucose-Capillary: 435 mg/dL — ABNORMAL HIGH (ref 70–99)
Glucose-Capillary: 124 mg/dL — ABNORMAL HIGH (ref 70–99)
Glucose-Capillary: 444 mg/dL — ABNORMAL HIGH (ref 70–99)
Glucose-Capillary: 354 mg/dL — ABNORMAL HIGH (ref 70–99)
Glucose-Capillary: 411 mg/dL — ABNORMAL HIGH (ref 70–99)
Glucose-Capillary: 192 mg/dL — ABNORMAL HIGH (ref 70–99)

## 2013-11-15 LAB — BASIC METABOLIC PANEL
BUN: 15 mg/dL (ref 6–23)
CO2: 32 mEq/L (ref 19–32)
Calcium: 8.2 mg/dL — ABNORMAL LOW (ref 8.4–10.5)
Chloride: 88 mEq/L — ABNORMAL LOW (ref 96–112)
Creatinine, Ser: 1.09 mg/dL (ref 0.50–1.10)
GFR, EST AFRICAN AMERICAN: 61 mL/min — AB (ref >90)
GFR, EST NON AFRICAN AMERICAN: 53 mL/min — AB (ref >90)
Glucose, Bld: 390 mg/dL — ABNORMAL HIGH (ref 70–99)
POTASSIUM: 3.7 meq/L (ref 3.7–5.3)
Sodium: 136 mEq/L — ABNORMAL LOW (ref 137–147)

## 2013-11-15 MED ORDER — INSULIN DETEMIR 100 UNIT/ML ~~LOC~~ SOLN
10.0000 [IU] | Freq: Every day | SUBCUTANEOUS | Status: DC
  Administered 2013-11-15 – 2013-11-16 (×2): 10 [IU] via SUBCUTANEOUS
  Filled 2013-11-15 (×2): qty 0.1

## 2013-11-15 MED ORDER — TECHNETIUM TC 99M SULFUR COLLOID: 2.0000 | Freq: Once | INTRAVENOUS | Status: AC | PRN

## 2013-11-15 NOTE — Progress Notes (Signed)
[facility] PROGRESS NOTE  Bridget Martinez [id number] DOB: Apr 27, 1951 DOA: 11/10/2013 PCP: [name], MD  Assessment/Plan: Diabetic ketoacidosis -Improved -Transitioned to Levemir -Developed hypoglycemia with Levemir, but may have showed a result of NovoLog and decreased by mouth intake during the transition from IV insulin -Question compliance with insulin at home as the patient has had 2 recent admissions with DKA -Patient became very distant on questions about compliance Recurrent vomiting -Gastric emptying study reveals normal gastric emptying -Although this does not completely rule out diabetic gastroparesis, she seems to have some benefit with Reglan -Continue Reglan for now -Continue PPI Bacteriuria -Discontinue ceftriaxone -No pyuria Diabetes mellitus type 2 -Restart Levemir 10 units -Continue sensitive sliding scale -Given the patient's hypoglycemia and poor by mouth intake, I will increase slowly CAD -Continue Coreg, Plavix, aspirin Chronic systolic heart failure/ischemic cardiomyopathy -Continue Lasix and Aldactone -Monitor renal function -EF 30-35% January 2015 Acute on chronic renal failure (CKD stage III) -Baseline creatinine 0.9-1.3 Hyperlipidemia -Continue statins at discharge   Family Communication:   Pt at beside Disposition Plan:   Home when medically stable     Procedures/Studies: Ct Abdomen Pelvis Wo Contrast  10/25/2013   CLINICAL DATA:  Nausea, vomiting, DKA  EXAM: CT ABDOMEN AND PELVIS WITHOUT CONTRAST  TECHNIQUE: Multidetector CT imaging of the abdomen and pelvis was performed following the standard protocol without intravenous contrast.  COMPARISON:  Prior ultrasound from 05/23/2012  FINDINGS: The study is degraded by motion artifact.  The visualized lung bases are grossly clear.  Limited noncontrast evaluation of the liver is unremarkable. Gallbladder is within normal limits. No biliary ductal dilatation. The spleen, adrenal glands,  and pancreas demonstrate a normal unenhanced appearance.  The kidneys are equal in size without evidence of nephrolithiasis or hydronephrosis.  The bowels are grossly unremarkable without evidence of obstruction or inflammatory changes. Scattered colonic diverticula are present without acute diverticulitis. Appendix is visualized within the right lower quadrant and is of normal caliber without definite inflammatory changes.  Bladder is decompressed with a Foley catheter in place. Gas lucency within the bladder likely related to catheterization. The uterus and ovaries are within normal limits for patient age.  No free air or fluid identified. No enlarged intra-abdominal pelvic lymph nodes. There is asymmetric mild inflammatory soft tissue stranding within the subcutaneous fat in the left inguinal region adjacent to the left femoral artery (series 2, image 88). Finding is of uncertain significance. No loculated collection.  Moderate atherosclerotic disease seen within the aorta and bilateral iliac arteries.  No acute osseous abnormality identified. Multilevel degenerative changes noted within the visualized spine. No worrisome lytic or blastic osseous lesions.  IMPRESSION: 1. No CT evidence of acute intra-abdominal or pelvic process. 2. Asymmetric soft tissue stranding within the subcutaneous fat of the left inguinal region adjacent to the left femoral artery. This finding is of uncertain clinical significance, and may be 3. Colonic diverticulosis without acute diverticulitis. 4. Moderate aorto bi-iliac atherosclerotic disease.   Electronically Signed   By: Jeannine Boga M.D.   On: 10/25/2013 05:42   Dg Chest 2 View  11/10/2013   CLINICAL DATA:  Weakness. Nausea vomiting. Diabetic. High blood pressure.  EXAM: CHEST  2 VIEW  COMPARISON:  10/28/2013.  FINDINGS: Post CABG.  Minimal peribronchial thickening and central pulmonary vascular prominence without pulmonary edema, segmental infiltrate or pneumothorax.   Calcified aorta.  IMPRESSION: No evidence of congestive heart failure or pneumonia. Please see above.   Electronically Signed   By: Mignon Pine.D.  On: 11/10/2013 20:22   Dg Chest 2 View  10/28/2013   CLINICAL DATA:  Cough, clinical pneumonia  EXAM: CHEST  2 VIEW  COMPARISON:  DG CHEST 1V PORT dated 10/26/2013  FINDINGS: The lungs are well-expanded. There is no focal infiltrate. Tiny pleural effusions blunt the costophrenic angles bilaterally. The cardiopericardial silhouette is more normal in size today. The pulmonary vascularity is less engorged. The patient has undergone previous CABG. There is no pneumothorax. The trachea is midline. The observed portions of the bony thorax exhibit no acute abnormalities.  IMPRESSION: The findings suggest resolving CHF and interstitial edema. There is no alveolar pneumonia. Small bilateral pleural effusions layer posteriorly.   Electronically Signed   By: Koral Thaden  [place]   On: 10/28/2013 07:47   Ct Head Wo Contrast  10/25/2013   CLINICAL DATA:  Altered mental status  EXAM: CT HEAD WITHOUT CONTRAST  TECHNIQUE: Contiguous axial images were obtained from the base of the skull through the vertex without intravenous contrast.  COMPARISON:  Prior CT from 05/26/2007  FINDINGS: There is no acute intracranial hemorrhage or infarct. No mass lesion or midline shift. Gray-white matter differentiation is well maintained. Ventricles are normal in size without evidence of hydrocephalus. Cerebral volume within normal limits for patient age. Mild hypoattenuation within the periventricular white matter likely represents very mild chronic microvascular ischemic changes. No extra-axial fluid collection.  The calvarium is intact. Prominent calcifications subjacent to the inner table of the left frontal calvarium is unchanged.  Orbital soft tissues are within normal limits.  The paranasal sinuses and mastoid air cells are well pneumatized and free of fluid.  Scalp soft tissues are  unremarkable.  IMPRESSION: No acute intracranial process.   Electronically Signed   By: Jeannine Boga M.D.   On: 10/25/2013 03:32   Nm Gastric Emptying  11/15/2013   CLINICAL DATA:  Nausea, vomiting, and abdominal pain.  EXAM: NUCLEAR MEDICINE GASTRIC EMPTYING SCAN  TECHNIQUE: After oral ingestion of radiolabeled meal, sequential abdominal images were obtained for 120 minutes. Residual percentage of activity remaining within the stomach was calculated at 60 and 120 minutes.  COMPARISON:  None.  RADIOPHARMACEUTICALS:  2.0 mCi ofTechnetium 99-m labeled sulfur colloid  FINDINGS: Expected location of the stomach in the left upper quadrant. Ingested meal empties the stomach gradually over the course of the study with 5% retention at 60 min and 2% retention at 120 min (normal retention less than 30% at a 120 min).  IMPRESSION: Normal gastric emptying study.   Electronically Signed   By: Rozetta Nunnery M.D.   On: 11/15/2013 13:40   Dg Chest Port 1 View  10/26/2013   CLINICAL DATA:  Shortness of breath  EXAM: PORTABLE CHEST - 1 VIEW  COMPARISON:  10/25/2013  FINDINGS: Prior coronary bypass changes noted. Cardiomegaly evident with vascular congestion. Streaky perihilar and bibasilar atelectasis. No definite CHF or pneumonia. Negative for effusion or pneumothorax. Trachea midline.  IMPRESSION: Cardiomegaly with vascular congestion.  Bibasilar atelectasis  No significant interval change.   Electronically Signed   By: Daryll Brod M.D.   On: 10/26/2013 07:42   Dg Chest Portable 1 View  10/25/2013   CLINICAL DATA:  Hyperglycemic, altered mental status  EXAM: PORTABLE CHEST - 1 VIEW  COMPARISON:  DG CHEST 1V PORT dated 10/25/2013; DG CHEST 2 VIEW dated 05/23/2012; DG CHEST 1V PORT dated 11/14/2011  FINDINGS: Heart size upper normal to mildly enlarged but stable. Patient is status post CABG. Vascular pattern within normal limits. There is no consolidation  effusion or pulmonary edema.  IMPRESSION: No acute findings.    Electronically Signed   By: Skipper Cliche M.D.   On: 10/25/2013 07:23   Dg Chest Port 1 View  10/25/2013   CLINICAL DATA:  Dyspnea, cough  EXAM: PORTABLE CHEST - 1 VIEW  COMPARISON:  DG CHEST 2 VIEW dated 05/23/2012  FINDINGS: Sternotomy wires overlie normal cardiac silhouette. No effusion, infiltrate, or pneumothorax. No osseous abnormality.  IMPRESSION: No acute cardiopulmonary process.   Electronically Signed   By: Suzy Bouchard M.D.   On: 10/25/2013 01:54   US Abdomen Limited Ruq  11/10/2013   CLINICAL DATA:  Right upper quadrant pain.  EXAM: US ABDOMEN LIMITED - RIGHT UPPER QUADRANT  COMPARISON:  CT ABD/PELV WO CM dated 10/25/2013  FINDINGS: Gallbladder:  No gallstones or wall thickening visualized. No sonographic Murphy sign noted.  Common bile duct:  Diameter: 4 mm, normal.  Liver:  No focal lesion identified. Within normal limits in parenchymal echogenicity.  IMPRESSION: Negative right upper quadrant ultrasound.   Electronically Signed   By: Dereck Ligas M.D.   On: 11/10/2013 18:13         Subjective: Patient complaint of emesis x3 today although not reported by nursing staff. Denies fevers, chills, chest pain, shortness breath, coughing, hemoptysis, abdominal pain, dysuria, hematuria.   Objective: Filed Vitals:   11/15/13 0500 11/15/13 0945 11/15/13 1302 11/15/13 1635  BP: 115/41 106/74 119/45 105/47  Pulse: 97 84 96 89  Temp: 98.2 F (36.8 C) 98.8 F (37.1 C) 97.8 F (36.6 C) 98.7 F (37.1 C)  TempSrc: Oral     Resp: 16 17 18 18   Height:      Weight:      SpO2: 98% 100% 99% 97%    Intake/Output Summary (Last 24 hours) at 11/15/13 1731 Last data filed at 11/15/13 1302  Gross per 24 hour  Intake    100 ml  Output      0 ml  Net    100 ml   Weight change:  Exam:   General:  Pt is alert, follows commands appropriately, not in acute distress  HEENT: No icterus, No thrush, [place]/AT  Cardiovascular: RRR, S1/S2, no rubs, no gallops  Respiratory: CTA bilaterally,  no wheezing, no crackles, no rhonchi  Abdomen: Soft/+BS, non tender, non distended, no guarding  Extremities: trace LE edema, No lymphangitis, No petechiae, No rashes, no synovitis  Data Reviewed: Basic Metabolic Panel:  Recent Labs Lab 11/10/13 2324  11/13/13 0602 11/13/13 1010 11/13/13 1420 11/14/13 0405 11/15/13 0628  NA 137  < > 137 135* 134* 141 136*  K 3.5*  < > 3.9 3.2* 3.5* 3.0* 3.7  CL 98  < > 97 93* 93* 96 88*  CO2 20  < > 25 19 24  32 32  GLUCOSE 136*  < > 275* 240* 256* 31* 390*  BUN 28*  < > 8 7 7 7 15   CREATININE 1.18*  < > 0.97 0.97 0.95 1.00 1.09  CALCIUM 9.1  < > 8.3* 8.5 8.5 8.4 8.2*  MG 1.5  --   --   --   --   --   --   < > = values in this interval not displayed. Liver Function Tests:  Recent Labs Lab 11/10/13 1404 11/10/13 1623  AST 14 12  ALT 8 8  ALKPHOS 195* 190*  BILITOT 0.3 0.4  PROT 6.1 6.7  ALBUMIN  --  2.9*    Recent Labs Lab 11/10/13 1404  11/10/13 1623  LIPASE 50 48   No results found for this basename: AMMONIA,  in the last 168 hours CBC:  Recent Labs Lab 11/10/13 1428 11/10/13 1623 11/10/13 1725 11/11/13 0223  WBC 9.3 7.9  --  7.3  NEUTROABS  --  6.7  --   --   HGB 10.2* 10.5* 10.9* 9.1*  HCT 32.1* 31.3* 32.0* 27.0*  MCV 90.0 89.9  --  87.4  PLT  --  302  --  277   Cardiac Enzymes:  Recent Labs Lab 11/10/13 1712  TROPONINI <0.30   BNP: No components found with this basename: POCBNP,  CBG:  Recent Labs Lab 11/15/13 0015 11/15/13 0221 11/15/13 0732 11/15/13 0943 11/15/13 1333  GLUCAP 257* 124* 444* 354* 411*    Recent Results (from the past 240 hour(s))  MRSA PCR SCREENING     Status: None   Collection Time    11/10/13  9:46 PM      Result Value Ref Range Status   MRSA by PCR NEGATIVE  NEGATIVE Final   Comment:            The GeneXpert MRSA Assay (FDA     approved for NASAL specimens     only), is one component of a     comprehensive MRSA colonization     surveillance program. It is not      intended to diagnose MRSA     infection nor to guide or     monitor treatment for     MRSA infections.  URINE CULTURE     Status: None   Collection Time    11/11/13  8:44 PM      Result Value Ref Range Status   Specimen Description URINE, CLEAN CATCH   Final   Special Requests NONE   Final   Culture  Setup Time     Final   Value: 11/12/2013 01:12     Performed at [place]     Final   Value: >=100,000 COLONIES/ML     Performed at [facility]   Culture     Final   Value: ESCHERICHIA COLI     Performed at [facility]   Report Status 11/13/2013 FINAL   Final   Organism ID, Bacteria ESCHERICHIA COLI   Final     Scheduled Meds: . aspirin EC  325 mg Oral Daily  . carvedilol  6.25 mg Oral BID WC  . cefTRIAXone (ROCEPHIN)  IV  1 g Intravenous Q24H  . clopidogrel  75 mg Oral Q breakfast  . enoxaparin (LOVENOX) injection  40 mg Subcutaneous Q24H  . escitalopram  10 mg Oral Daily  . fluconazole  150 mg Oral Q Sat  . furosemide  40 mg Oral BID  . insulin aspart  0-5 Units Subcutaneous QHS  . insulin aspart  0-9 Units Subcutaneous TID WC  . insulin detemir  10 Units Subcutaneous Daily  . levothyroxine  125 mcg Oral QAC breakfast  . metoCLOPramide  5 mg Oral TID AC & HS  . nystatin cream   Topical BID  . pantoprazole  40 mg Oral BID  . spironolactone  12.5 mg Oral Daily   Continuous Infusions:    Kahlil Cowans, DO  [facility] Pager [phone]  If 7PM-7AM, please contact night-coverage [email or web address] Password [email or web address] 11/15/2013, 5:31 PM   LOS: 5 days

## 2013-11-15 NOTE — Progress Notes (Signed)
Inpatient Diabetes Program Recommendations  AACE/ADA: New Consensus Statement on Inpatient Glycemic Control (2013)  Target Ranges:  Prepandial:   less than 140 mg/dL      Peak postprandial:   less than 180 mg/dL (1-2 hours)      Critically ill patients:  140 - 180 mg/dL     Results for Bridget Martinez, Bridget Martinez (MRN [id number]) as of 11/15/2013 09:50  Ref. Range 11/14/2013 05:35 11/14/2013 06:07 11/14/2013 08:11 11/14/2013 11:46 11/14/2013 16:28 11/14/2013 21:37 11/14/2013 21:38 11/14/2013 23:18  Glucose-Capillary Latest Range: 70-99 mg/dL 37 (LL) 76 228 (H) 145 (H) 272 (H) 587 (HH) 590 (HH) 435 (H)    Results for Bridget Martinez, Bridget Martinez (MRN [id number]) as of 11/15/2013 09:50  Ref. Range 11/15/2013 00:15 11/15/2013 02:21 11/15/2013 07:32  Glucose-Capillary Latest Range: 70-99 mg/dL 257 (H) 124 (H) 444 (H)     **Pt hypoglycemic on AM of 03/08 with CBG 37 mg/dl after receiving 23 units Levemir the day before.  Per Dr. Lyman Speller note, patient was supposed to receive 8 units of Levemir last night, however, dose was d/c'd and patient Never received any Levemir last night.  Not sure what happened to the Levemir 8 units QHS dose??  Now, patient having major hyperglycemia (CBG 444 mg/dl this AM)  **Patient also had extreme hyperglycemia yesterday afternoon.  Patient is eating 75-100% of meals.  **Per records, patient supposedly takes the following insulin at home: Levemir 20 units daily Novolog 14 units breakfast/ 12 units lunch/ 7 units with dinner Novolog 1 unit for every 35 mg/dl above CBG of 100 mg/dl   **MD- Please consider the following in-hospital insulin adjustments:  1. Add back Levemir at 2/3 home dose- Levemir 14 units daily 2. Add Novolog meal coverage- Novolog 6 units tid with meals to start 3. Continue Novolog Sensitive SSI   Will follow. [name] RN, MSN, CDE Diabetes Coordinator Inpatient Diabetes Program Team Pager: [phone] (8a-10p)

## 2013-11-15 NOTE — Progress Notes (Signed)
   CARE MANAGEMENT NOTE 11/15/2013  Patient:  Bridget Martinez, Bridget Martinez   Account Number:  [id number]  Date Initiated:  11/15/2013  Documentation initiated by:  ROYAL,CHERYL  Subjective/Objective Assessment:   Referral for [facility] and CHF program.     Action/Plan:   HHRN   Anticipated DC Date:  11/17/2013   Anticipated DC Plan:  [place] [facility]  CM consult      [facility] Choice  HOME HEALTH   Choice offered to / List presented to:  C-1 Patient        [place] arranged  HH-1 RN  [place].   Status of service:  Completed, signed off Medicare Important Message given?   (If response is "NO", the following Medicare IM given date fields will be blank) Date Medicare IM given:   Date Additional Medicare IM given:    Discharge Disposition:  [place]  Per UR Regulation:    If discussed at Long Length of Stay Meetings, dates discussed:    Comments:  11/15/2013  [place], [place] CM referral: home health RN  Met with patient regarding choice for home health services. She selected [place].  [place] called with referral for RN, patient has recent admission for DKA and hx of CHF.

## 2013-11-16 ENCOUNTER — Inpatient Hospital Stay (HOSPITAL_COMMUNITY): Admission: RE | Admit: 2013-11-16 | Payer: BC Managed Care – PPO | Source: Ambulatory Visit

## 2013-11-16 ENCOUNTER — Encounter (HOSPITAL_COMMUNITY): Payer: Self-pay | Admitting: Physician Assistant

## 2013-11-16 LAB — COMPREHENSIVE METABOLIC PANEL
ALT: 7 U/L (ref 0–35)
AST: 10 U/L (ref 0–37)
Albumin: 2.9 g/dL — ABNORMAL LOW (ref 3.5–5.2)
Alkaline Phosphatase: 217 U/L — ABNORMAL HIGH (ref 39–117)
BUN: 17 mg/dL (ref 6–23)
CALCIUM: 8.8 mg/dL (ref 8.4–10.5)
CO2: 35 meq/L — AB (ref 19–32)
CREATININE: 1.25 mg/dL — AB (ref 0.50–1.10)
Chloride: 87 mEq/L — ABNORMAL LOW (ref 96–112)
GFR calc Af Amer: 52 mL/min — ABNORMAL LOW (ref >90)
GFR, EST NON AFRICAN AMERICAN: 45 mL/min — AB (ref >90)
GLUCOSE: 227 mg/dL — AB (ref 70–99)
Potassium: 3.4 mEq/L — ABNORMAL LOW (ref 3.7–5.3)
Sodium: 138 mEq/L (ref 137–147)
Total Bilirubin: 0.3 mg/dL (ref 0.3–1.2)
Total Protein: 6.5 g/dL (ref 6.0–8.3)

## 2013-11-16 LAB — CBC
HCT: 31.2 % — ABNORMAL LOW (ref 36.0–46.0)
HEMOGLOBIN: 10.4 g/dL — AB (ref 12.0–15.0)
MCH: 29.4 pg (ref 26.0–34.0)
MCHC: 33.3 g/dL (ref 30.0–36.0)
MCV: 88.1 fL (ref 78.0–100.0)
Platelets: 321 10*3/uL (ref 150–400)
RBC: 3.54 MIL/uL — ABNORMAL LOW (ref 3.87–5.11)
RDW: 14.3 % (ref 11.5–15.5)
WBC: 6.2 10*3/uL (ref 4.0–10.5)

## 2013-11-16 LAB — GLUCOSE, CAPILLARY
GLUCOSE-CAPILLARY: 335 mg/dL — AB (ref 70–99)
Glucose-Capillary: 303 mg/dL — ABNORMAL HIGH (ref 70–99)
Glucose-Capillary: 129 mg/dL — ABNORMAL HIGH (ref 70–99)
Glucose-Capillary: 176 mg/dL — ABNORMAL HIGH (ref 70–99)

## 2013-11-16 MED ORDER — POTASSIUM CHLORIDE CRYS ER 20 MEQ PO TBCR
20.0000 meq | EXTENDED_RELEASE_TABLET | Freq: Once | ORAL | Status: AC
  Administered 2013-11-16: 20 meq via ORAL
  Filled 2013-11-16: qty 1

## 2013-11-16 MED ORDER — INSULIN DETEMIR 100 UNIT/ML ~~LOC~~ SOLN
13.0000 [IU] | Freq: Every day | SUBCUTANEOUS | Status: DC
Start: 2013-11-17 — End: 2013-11-18
  Administered 2013-11-17 – 2013-11-18 (×2): 13 [IU] via SUBCUTANEOUS
  Filled 2013-11-16 (×3): qty 0.13

## 2013-11-16 MED ORDER — FUROSEMIDE 40 MG PO TABS
40.0000 mg | ORAL_TABLET | Freq: Every day | ORAL | Status: DC
  Administered 2013-11-17 – 2013-11-19 (×3): 40 mg via ORAL
  Filled 2013-11-16 (×3): qty 1

## 2013-11-16 MED ORDER — SODIUM CHLORIDE 0.9 % IV SOLN: INTRAVENOUS | Status: DC

## 2013-11-16 NOTE — Progress Notes (Signed)
Inpatient Diabetes Program Recommendations  AACE/ADA: New Consensus Statement on Inpatient Glycemic Control (2013)  Target Ranges:  Prepandial:   less than 140 mg/dL      Peak postprandial:   less than 180 mg/dL (1-2 hours)      Critically ill patients:  140 - 180 mg/dL     Results for AMEE, GLADSTONE (MRN [id number]) as of 11/16/2013 09:21  Ref. Range 11/15/2013 00:15 11/15/2013 02:21 11/15/2013 07:32 11/15/2013 09:43 11/15/2013 13:33 11/15/2013 16:33 11/15/2013 19:53  Glucose-Capillary Latest Range: 70-99 mg/dL 257 (H) 124 (H) 444 (H) 354 (H) 411 (H) 134 (H) 192 (H)    Results for TRINETTA, FREYTES (MRN [id number]) as of 11/16/2013 09:21  Ref. Range 11/16/2013 07:23  Glucose-Capillary Latest Range: 70-99 mg/dL 335 (H)     **Note Levemir 10 units restarted yesterday.    **Patient had good PO intake on 03/08 but was NPO yesterday and then ate poorly last night and this morning.   **MD- Please consider the following insulin adjustments:  1. Change Novolog Sensitive SSI coverage to Q4 hours if pt continues to have poor PO intake (currently ordered tid ac + HS) 2. Increase Levemir slightly to 13 units daily (fasting glucose this AM 335 mg/dl)   Will follow. [name] RN, MSN, CDE Diabetes Coordinator Inpatient Diabetes Program Team Pager: [phone] (8a-10p)

## 2013-11-16 NOTE — Progress Notes (Signed)
Pharmacist Heart Failure Core Measure Documentation  Assessment: Bridget Martinez has an EF documented as 30-35% on 09/21/13 by ECHO.  Rationale: Heart failure patients with left ventricular systolic dysfunction (LVSD) and an EF < 40% should be prescribed an angiotensin converting enzyme inhibitor (ACEI) or angiotensin receptor blocker (ARB) at discharge unless a contraindication is documented in the medical record.  This patient is not currently on an ACEI or ARB for HF.  This note is being placed in the record in order to provide documentation that a contraindication to the use of these agents is present for this encounter.  ACE Inhibitor or Angiotensin Receptor Blocker is contraindicated (specify all that apply)  []   ACEI allergy AND ARB allergy []   Angioedema []   Moderate or severe aortic stenosis []   Hyperkalemia []   Hypotension []   Renal artery stenosis [x]   Worsening renal function, preexisting renal disease or dysfunction   Albertina Parr, PharmD.  Clinical Pharmacist Pager [phone]

## 2013-11-16 NOTE — Consult Note (Signed)
[facility] Consult: 2:22 PM 11/16/2013  LOS: 6 days    Referring Provider: Dr Carles Collet  Primary Care Physician:  [facility]  Dietrich Pates MD Primary Gastroenterologist:  unassigned     Reason for Consultation:  One year intermittent RUQ pain, persistent for 2 weeks with non bloody n/v.   HPI: Bridget Martinez is a 63 y.o. female.  Has IDDM and recent DKA admissions for this.  Hx CABG and takes Plavix for ?  Stage 3 CKD.  Non STEMI with cardiogenic shock in 2012/2013 at time of CABG.  EF 30 - 35%  C/o above sxs and had sugar of 522 at PMD office so admitted 3/4. Diagnosed and treated for cipro resistant E coli UTI rocephin   Her RUQ pain and nausea is within 1 to 2 hours of eating.  No bloody emesis.  No diarrhea or constipation.  No NSAIDs, no ETOH  Had normal gastric emptying scan and n/v persists despite PPI (never on this PTA) Non contrast CT scan in mid February showed normal GB and bile ducts. Did show  Moderate atherosclerotic disease seen within the aorta and bilateral iliac arteries. 05/2012 ultrasound abdomen was negative.  No previous egd/colon or gi eval .   She is not aware of taking PPI, though Protonix is on her outpt med list.  She says she has taken iron in the past, but not currently.  Takes Fosamax weekly but no dysphagia.  No narcotics at home.   I make note of 4 separate abd ultraounds dateing to 2002 - 2013 for n/v and RUQ pain as well as abnormal LFTs.  However pt seems to imply problem only present for about one year.     Past Medical History  Diagnosis Date  . Diabetes mellitus     on insulin, with h/o DKA   . HTN (hypertension)   . Hyperlipidemia   . Venous stasis ulcers   . Arthritis   . Depression     anxiety  . Myocardial infarction     NSTEMI 07/2011 with cardiogenic shock, s/p  CABG 09/2011  . Hypertension   . Coronary artery disease     angina.  MI.   . Pneumonia        . GERD (gastroesophageal reflux disease)     Hiatal hernia.   . Hypothyroidism   . CHF (congestive heart failure)   . Tuberculosis   . Osteoporosis     Past Surgical History  Procedure Laterality Date  . Cardiac catheterization    . Tonsillectomy    . Coronary artery bypass graft      Dr. Prescott Gum in 09/2011   . Coronary artery bypass graft  2012    Prior to Admission medications   Medication Sig Start Date End Date Taking? Authorizing Provider  alendronate (FOSAMAX) 70 MG tablet Take 1 tablet (70 mg total) by mouth once a week. 09/27/13  Yes Lysbeth Penner, FNP  aspirin EC 325 MG tablet Take 325 mg by mouth daily.   Yes Historical Provider, MD  carvedilol (COREG)  6.25 MG tablet Take 1 tablet (6.25 mg total) by mouth 2 (two) times daily with a meal. 10/29/13  Yes Bobby Rumpf York, PA-C  clopidogrel (PLAVIX) 75 MG tablet Take 75 mg by mouth daily with breakfast.   Yes Historical Provider, MD  enalapril (VASOTEC) 2.5 MG tablet Take 1 tablet (2.5 mg total) by mouth 2 (two) times daily. 10/13/13  Yes Jolaine Artist, MD  escitalopram (LEXAPRO) 10 MG tablet Take 1 tablet (10 mg total) by mouth daily. 09/27/13  Yes Lysbeth Penner, FNP  fluconazole (DIFLUCAN) 150 MG tablet Take 1 tablet (150 mg total) by mouth once a week. 10/29/13  Yes Bobby Rumpf York, PA-C  furosemide (LASIX) 40 MG tablet Take 1 tablet (40 mg total) by mouth daily. 10/29/13  Yes Bobby Rumpf York, PA-C  insulin aspart (NOVOLOG) 100 UNIT/ML injection Inject 100 Units into the skin 3 (three) times daily with meals. 14 units at breakfast, 12 units at lunch, 7 units supper Plus 1 unit for every $RemoveB'35mg'tviWQQWQ$ /dL above 100 mg/dL   Yes Historical Provider, MD  insulin detemir (LEVEMIR) 100 UNIT/ML injection Inject 0.2 mLs (20 Units total) into the skin daily. 10/29/13  Yes Bobby Rumpf York, PA-C  levothyroxine (SYNTHROID, LEVOTHROID) 125 MCG tablet  Take 1 tablet (125 mcg total) by mouth daily. 09/27/13  Yes Lysbeth Penner, FNP  nystatin cream (MYCOSTATIN) Apply topically 2 (two) times daily. 10/29/13  Yes Marianne L York, PA-C  pantoprazole (PROTONIX) 40 MG tablet Take 40 mg by mouth daily. 11/26/11 12/31/13 Yes Shaune Pascal [name], MD  potassium chloride (K-DUR) 10 MEQ tablet Take 1 tablet (10 mEq total) by mouth daily. 07/14/12  Yes Wynetta Emery, PA-C  rosuvastatin (CRESTOR) 40 MG tablet Take 1 tablet (40 mg total) by mouth daily. 09/27/13  Yes Lysbeth Penner, FNP  spironolactone (ALDACTONE) 25 MG tablet Take 12.5 mg by mouth daily.   Yes Historical Provider, MD  Vitamin D, Ergocalciferol, (DRISDOL) 50000 UNITS CAPS capsule Take 1 capsule (50,000 Units total) by mouth every 7 (seven) days. 09/29/13  Yes Lysbeth Penner, FNP  ondansetron (ZOFRAN ODT) 8 MG disintegrating tablet Take 1 tablet (8 mg total) by mouth every 8 (eight) hours as needed for nausea or vomiting. 11/10/13   Lysbeth Penner, FNP    Scheduled Meds: . aspirin EC  325 mg Oral Daily  . carvedilol  6.25 mg Oral BID WC  . clopidogrel  75 mg Oral Q breakfast  . enoxaparin (LOVENOX) injection  40 mg Subcutaneous Q24H  . escitalopram  10 mg Oral Daily  . fluconazole  150 mg Oral Q Sat  . [START ON 11/17/2013] furosemide  40 mg Oral Daily  . insulin aspart  0-5 Units Subcutaneous QHS  . insulin aspart  0-9 Units Subcutaneous TID WC  . [START ON 11/17/2013] insulin detemir  13 Units Subcutaneous Daily  . levothyroxine  125 mcg Oral QAC breakfast  . metoCLOPramide  5 mg Oral TID AC & HS  . nystatin cream   Topical BID  . pantoprazole  40 mg Oral BID  . potassium chloride  20 mEq Oral Once  . spironolactone  12.5 mg Oral Daily   Infusions:   PRN Meds: acetaminophen, dextrose, morphine injection, ondansetron   Allergies as of 11/10/2013 - Review Complete 11/10/2013  Allergen Reaction Noted  . Sulfa antibiotics Anaphylaxis and Swelling 07/25/2011    Family History    Problem Relation Age of Onset  . Heart disease Father   . Multiple  sclerosis Father   . Hypertension Mother   . Hyperthyroidism Mother   . Diabetes Cousin     Multiple maternal cousins with type 2 diabetes mellitus  . Diabetes Maternal Uncle     Type 1 diabetes mellitus  . Heart attack Paternal Grandfather   . Heart disease Paternal Grandfather     History   Social History  . Marital Status: Married    Spouse Name: Grayland Ormond [phone]; Son Rush Landmark    Number of Children: 2  . Years of Education: N/A   Occupational History  . Not on file.   Social History Main Topics  . Smoking status: Never Smoker   . Smokeless tobacco: Not on file  . Alcohol Use: No  . Drug Use: No  . Sexual Activity: Yes    Birth Control/ Protection: Post-menopausal   Other Topics Concern  . Not on file   Social History Narrative   ** Merged History Encounter **       Genasis Zingale (Husband): [id number] [facility]) [phone] (Cell)    REVIEW OF SYSTEMS: Constitutional:  10 # drop in 4 weeks ENT:  No nose bleeds Pulm:  No cough or dyspnea CV:  No palpitations, no LE edema.  GU:  No hematuria, no frequency GI:  No dysphagia.  No heart burn.  No liver issues in past Heme:  Anemia treated with iron    Transfusions:  none Neuro:  No headaches, no peripheral tingling or numbness Derm:  No itching, no rash or sores.  Endocrine:  No sweats or chills.  No polyuria or dysuria Immunization:  Up to date flu, pneumovax Travel:  None beyond local counties in last few months.    PHYSICAL EXAM: Vital signs in last 24 hours: Filed Vitals:   11/16/13 1336  BP: 113/55  Pulse: 91  Temp: 98 F (36.7 C)  Resp: 18   Wt Readings from Last 3 Encounters:  11/15/13 74.798 kg (164 lb 14.4 oz)  11/10/13 74.844 kg (165 lb)  10/29/13 79.2 kg (174 lb 9.7 oz)  General: looks well, a bit weak Head:  No asymmetry or swelling  Eyes:  No icterus or pallor Ears:  Not HOH  Nose:  No discharge or congestion Mouth:   Clear, moist Neck:  No mass Lungs:  Clear  breathing easily Heart: RRR.  No MRG Abdomen:  Soft, ND, minor tenderness in right and left UQ.   Rectal: deferred   Musc/Skeltl: no swelling or deformity Extremities:  No CCE  Neurologic:  Halting speech pattern but no tremor, no limb weakness Skin:  No telangectasia. Tattoos:  None  Nodes:  No inguinal adenopathy   Psych:  Cooperative, relaxed, pleasant.   Intake/Output from previous day: 03/09 0701 - 03/10 0700 In: 290 [P.O.:240; IV Piggyback:50] Out: -  Intake/Output this shift: Total I/O In: 160 [P.O.:160] Out: -   LAB RESULTS:  Recent Labs  11/16/13 0520  WBC 6.2  HGB 10.4*  HCT 31.2*  PLT 321   BMET Lab Results  Component Value Date   NA 138 11/16/2013   NA 136* 11/15/2013   NA 141 11/14/2013   K 3.4* 11/16/2013   K 3.7 11/15/2013   K 3.0* 11/14/2013   CL 87* 11/16/2013   CL 88* 11/15/2013   CL 96 11/14/2013   CO2 35* 11/16/2013   CO2 32 11/15/2013   CO2 32 11/14/2013   GLUCOSE 227* 11/16/2013   GLUCOSE 390* 11/15/2013   GLUCOSE 31* 11/14/2013   BUN 17 11/16/2013  BUN 15 11/15/2013   BUN 7 11/14/2013   CREATININE 1.25* 11/16/2013   CREATININE 1.09 11/15/2013   CREATININE 1.00 11/14/2013   CALCIUM 8.8 11/16/2013   CALCIUM 8.2* 11/15/2013   CALCIUM 8.4 11/14/2013   LFT  Recent Labs  11/16/13 0520  PROT 6.5  ALBUMIN 2.9*  AST 10  ALT 7  ALKPHOS 217*  BILITOT 0.3   PT/INR Lab Results  Component Value Date   INR 1.42 10/25/2013   INR 1.55* 09/03/2013   INR 1.06 05/23/2012   Hepatitis Panel No results found for this basename: HEPBSAG, HCVAB, HEPAIGM, HEPBIGM,  in the last 72 hours C-Diff No components found with this basename: cdiff   Lipase     Component Value Date/Time   LIPASE 48 11/10/2013 1623    Drugs of Abuse  No results found for this basename: labopia,  cocainscrnur,  labbenz,  amphetmu,  thcu,  labbarb     RADIOLOGY STUDIES: Nm Gastric Emptying  11/15/2013   CLINICAL DATA:  Nausea, vomiting, and abdominal pain.   EXAM: NUCLEAR MEDICINE GASTRIC EMPTYING SCAN  TECHNIQUE: After oral ingestion of radiolabeled meal, sequential abdominal images were obtained for 120 minutes. Residual percentage of activity remaining within the stomach was calculated at 60 and 120 minutes.  COMPARISON:  None.  RADIOPHARMACEUTICALS:  2.0 mCi ofTechnetium 99-m labeled sulfur colloid  FINDINGS: Expected location of the stomach in the left upper quadrant. Ingested meal empties the stomach gradually over the course of the study with 5% retention at 60 min and 2% retention at 120 min (normal retention less than 30% at a 120 min).  IMPRESSION: Normal gastric emptying study.   Electronically Signed   By: Rozetta Nunnery M.D.   On: 11/15/2013 13:40   10/25/2012  CT ABDOMEN AND PELVIS WITHOUT CONTRAST FINDINGS:  The study is degraded by motion artifact.  The visualized lung bases are grossly clear.  Limited noncontrast evaluation of the liver is unremarkable.  Gallbladder is within normal limits. No biliary ductal dilatation.  The spleen, adrenal glands, and pancreas demonstrate a normal  unenhanced appearance.  The kidneys are equal in size without evidence of nephrolithiasis or  hydronephrosis.  The bowels are grossly unremarkable without evidence of obstruction  or inflammatory changes. Scattered colonic diverticula are present  without acute diverticulitis. Appendix is visualized within the  right lower quadrant and is of normal caliber without definite  inflammatory changes.  Bladder is decompressed with a Foley catheter in place. Gas lucency  within the bladder likely related to catheterization. The uterus and  ovaries are within normal limits for patient age.  No free air or fluid identified. No enlarged intra-abdominal pelvic  lymph nodes. There is asymmetric mild inflammatory soft tissue  stranding within the subcutaneous fat in the left inguinal region  adjacent to the left femoral artery (series 2, image 88). Finding is  of  uncertain significance. No loculated collection.  Moderate atherosclerotic disease seen within the aorta and bilateral  iliac arteries.  No acute osseous abnormality identified. Multilevel degenerative  changes noted within the visualized spine. No worrisome lytic or  blastic osseous lesions.  IMPRESSION:  1. No CT evidence of acute intra-abdominal or pelvic process.  2. Asymmetric soft tissue stranding within the subcutaneous fat of  the left inguinal region adjacent to the left femoral artery. This  finding is of uncertain clinical significance, and may be  3. Colonic diverticulosis without acute diverticulitis.  4. Moderate aorto bi-iliac atherosclerotic disease.     ENDOSCOPIC STUDIES:  none  IMPRESSION:   *  Chronic, several year hx pain in RUQ.  Nausea and vomiting.  Pattern is post prandial .  No GB disease on several previous ultrasounds, LFTs normal but alk phos in 170s this admisiion and 190s in Feb admission.  GES is normal.  Not responding to PPI.  ? Infectious (candidial) cause of n/v.?  *  CHF.  BNP 1470  *  Poorly controlled IDDM with DKA.  This is 3rd admission in 4 months for this.  *  Chronic plavix, indication not clear.      PLAN:     *  EGD tomorrow.  *  I see that Dr Tat ordered HIDA scan for tomorrow.    Azucena Freed  11/16/2013, 2:22 PM Pager: [phone]  Attending MD note:   I have taken a history, examined the patient, and reviewed the chart. I agree with the Advanced Practitioner's impression and recommendations. If EGD negative, we will consider colon related pain  ( diverticulosis, mobile cecum ,doubt colitis). Will reassess after EGD  [facility] Pager # [phone]

## 2013-11-16 NOTE — Progress Notes (Signed)
[facility] PROGRESS NOTE  Bridget Martinez [id number] DOB: 1950-11-03 DOA: 11/10/2013 PCP: [name], MD  Interim summary 63 y.o. female with PMH relevant for CAD, Chronic systolic CHF, IDDM x 40 years, 2 recent admissions for DKA (last one being 2/20) She reported feeling well for about a week after discharge and then this past week started experiencing, nausea, intermittent vomiting, vague r sided abdominal discomfort. In ER on 3/7 was found to have hyperglycemia with CBG 619 and an AG of 22. An insulin drip was started Patient was admitted to the hospitalist service. Able to be weaned off of insulin drip. Although the patient had an initial hypoglycemic episode with transitioning to subcutaneous insulin, her sugars have largely remained poorly controlled during the admission. She continues to have persistent nausea and intermittent vomiting despite a normal gastric emptying study. GI consultation was requested. HIDA scan was ordered to rule out chronic cholecystitis. Discussion with the patient's son validates some degree of noncompliance at home although the patient adamantly denies this.     Assessment/Plan: Diabetic ketoacidosis  -Improved  -Transitioned to Levemir  -Developed hypoglycemia with Levemir, but may have showed a result of NovoLog and decreased by mouth intake during the transition from IV insulin  -Question compliance with insulin at home as the patient has had 2 recent admissions with DKA  -Patient became very distant on questions about compliance  -Discussed with the patient's son who also questions the patient's compliance at home--he stated that with the patient's persistent nausea, depression, the patient has been under treating herself regarding her insulin. Recurrent vomiting/nausea -Gastric emptying study--normal -Although this does not completely rule out diabetic gastroparesis, she seems to have some benefit with Reglan  -Continue Reglan for now   -Continue PPI  -HIDA scan to rule out chronic cholecystitis--previous abdominal ultrasounds and CT abdomen negative for gallbladder wall thickening or pericholecystic fluid -Patient requested GI evaluation which I have requested ([facility])--??PUD/gastritis Bacteriuria  -Discontinue ceftriaxone  -No pyuria  Diabetes mellitus type 2  -increase Levemir 13 units  -Continue sensitive sliding scale  -Given the patient's hypoglycemia and poor by mouth intake, I will increase slowly  CAD  -Continue Coreg, Plavix, aspirin  Chronic systolic heart failure/ischemic cardiomyopathy  -Continue Lasix and Aldactone  -Monitor renal function  -EF 30-35% January 2015  -With increasing creatinine, decrease furosemide to home dose Acute on chronic renal failure (CKD stage III)  -Baseline creatinine 0.9-1.3  Hyperlipidemia  -Continue statins at discharge   Family Communication:   Son updated on phone Disposition Plan:   Home when medically stable      Procedures/Studies: Ct Abdomen Pelvis Wo Contrast  10/25/2013   CLINICAL DATA:  Nausea, vomiting, DKA  EXAM: CT ABDOMEN AND PELVIS WITHOUT CONTRAST  TECHNIQUE: Multidetector CT imaging of the abdomen and pelvis was performed following the standard protocol without intravenous contrast.  COMPARISON:  Prior ultrasound from 05/23/2012  FINDINGS: The study is degraded by motion artifact.  The visualized lung bases are grossly clear.  Limited noncontrast evaluation of the liver is unremarkable. Gallbladder is within normal limits. No biliary ductal dilatation. The spleen, adrenal glands, and pancreas demonstrate a normal unenhanced appearance.  The kidneys are equal in size without evidence of nephrolithiasis or hydronephrosis.  The bowels are grossly unremarkable without evidence of obstruction or inflammatory changes. Scattered colonic diverticula are present without acute diverticulitis. Appendix is visualized within the right lower quadrant and is of normal  caliber without definite inflammatory changes.  Bladder is decompressed with a Foley  catheter in place. Gas lucency within the bladder likely related to catheterization. The uterus and ovaries are within normal limits for patient age.  No free air or fluid identified. No enlarged intra-abdominal pelvic lymph nodes. There is asymmetric mild inflammatory soft tissue stranding within the subcutaneous fat in the left inguinal region adjacent to the left femoral artery (series 2, image 88). Finding is of uncertain significance. No loculated collection.  Moderate atherosclerotic disease seen within the aorta and bilateral iliac arteries.  No acute osseous abnormality identified. Multilevel degenerative changes noted within the visualized spine. No worrisome lytic or blastic osseous lesions.  IMPRESSION: 1. No CT evidence of acute intra-abdominal or pelvic process. 2. Asymmetric soft tissue stranding within the subcutaneous fat of the left inguinal region adjacent to the left femoral artery. This finding is of uncertain clinical significance, and may be 3. Colonic diverticulosis without acute diverticulitis. 4. Moderate aorto bi-iliac atherosclerotic disease.   Electronically Signed   By: Jeannine Boga M.D.   On: 10/25/2013 05:42   Dg Chest 2 View  11/10/2013   CLINICAL DATA:  Weakness. Nausea vomiting. Diabetic. High blood pressure.  EXAM: CHEST  2 VIEW  COMPARISON:  10/28/2013.  FINDINGS: Post CABG.  Minimal peribronchial thickening and central pulmonary vascular prominence without pulmonary edema, segmental infiltrate or pneumothorax.  Calcified aorta.  IMPRESSION: No evidence of congestive heart failure or pneumonia. Please see above.   Electronically Signed   By: Chauncey Cruel M.D.   On: 11/10/2013 20:22   Dg Chest 2 View  10/28/2013   CLINICAL DATA:  Cough, clinical pneumonia  EXAM: CHEST  2 VIEW  COMPARISON:  DG CHEST 1V PORT dated 10/26/2013  FINDINGS: The lungs are well-expanded. There is no focal  infiltrate. Tiny pleural effusions blunt the costophrenic angles bilaterally. The cardiopericardial silhouette is more normal in size today. The pulmonary vascularity is less engorged. The patient has undergone previous CABG. There is no pneumothorax. The trachea is midline. The observed portions of the bony thorax exhibit no acute abnormalities.  IMPRESSION: The findings suggest resolving CHF and interstitial edema. There is no alveolar pneumonia. Small bilateral pleural effusions layer posteriorly.   Electronically Signed   By: Mare Ludtke  [place]   On: 10/28/2013 07:47   Ct Head Wo Contrast  10/25/2013   CLINICAL DATA:  Altered mental status  EXAM: CT HEAD WITHOUT CONTRAST  TECHNIQUE: Contiguous axial images were obtained from the base of the skull through the vertex without intravenous contrast.  COMPARISON:  Prior CT from 05/26/2007  FINDINGS: There is no acute intracranial hemorrhage or infarct. No mass lesion or midline shift. Gray-white matter differentiation is well maintained. Ventricles are normal in size without evidence of hydrocephalus. Cerebral volume within normal limits for patient age. Mild hypoattenuation within the periventricular white matter likely represents very mild chronic microvascular ischemic changes. No extra-axial fluid collection.  The calvarium is intact. Prominent calcifications subjacent to the inner table of the left frontal calvarium is unchanged.  Orbital soft tissues are within normal limits.  The paranasal sinuses and mastoid air cells are well pneumatized and free of fluid.  Scalp soft tissues are unremarkable.  IMPRESSION: No acute intracranial process.   Electronically Signed   By: Jeannine Boga M.D.   On: 10/25/2013 03:32   Nm Gastric Emptying  11/15/2013   CLINICAL DATA:  Nausea, vomiting, and abdominal pain.  EXAM: NUCLEAR MEDICINE GASTRIC EMPTYING SCAN  TECHNIQUE: After oral ingestion of radiolabeled meal, sequential abdominal images were obtained for 120 minutes.  Residual percentage of activity remaining within the stomach was calculated at 60 and 120 minutes.  COMPARISON:  None.  RADIOPHARMACEUTICALS:  2.0 mCi ofTechnetium 99-m labeled sulfur colloid  FINDINGS: Expected location of the stomach in the left upper quadrant. Ingested meal empties the stomach gradually over the course of the study with 5% retention at 60 min and 2% retention at 120 min (normal retention less than 30% at a 120 min).  IMPRESSION: Normal gastric emptying study.   Electronically Signed   By: Rozetta Nunnery M.D.   On: 11/15/2013 13:40   Dg Chest Port 1 View  10/26/2013   CLINICAL DATA:  Shortness of breath  EXAM: PORTABLE CHEST - 1 VIEW  COMPARISON:  10/25/2013  FINDINGS: Prior coronary bypass changes noted. Cardiomegaly evident with vascular congestion. Streaky perihilar and bibasilar atelectasis. No definite CHF or pneumonia. Negative for effusion or pneumothorax. Trachea midline.  IMPRESSION: Cardiomegaly with vascular congestion.  Bibasilar atelectasis  No significant interval change.   Electronically Signed   By: Daryll Brod M.D.   On: 10/26/2013 07:42   Dg Chest Portable 1 View  10/25/2013   CLINICAL DATA:  Hyperglycemic, altered mental status  EXAM: PORTABLE CHEST - 1 VIEW  COMPARISON:  DG CHEST 1V PORT dated 10/25/2013; DG CHEST 2 VIEW dated 05/23/2012; DG CHEST 1V PORT dated 11/14/2011  FINDINGS: Heart size upper normal to mildly enlarged but stable. Patient is status post CABG. Vascular pattern within normal limits. There is no consolidation effusion or pulmonary edema.  IMPRESSION: No acute findings.   Electronically Signed   By: Skipper Cliche M.D.   On: 10/25/2013 07:23   Dg Chest Port 1 View  10/25/2013   CLINICAL DATA:  Dyspnea, cough  EXAM: PORTABLE CHEST - 1 VIEW  COMPARISON:  DG CHEST 2 VIEW dated 05/23/2012  FINDINGS: Sternotomy wires overlie normal cardiac silhouette. No effusion, infiltrate, or pneumothorax. No osseous abnormality.  IMPRESSION: No acute cardiopulmonary  process.   Electronically Signed   By: Suzy Bouchard M.D.   On: 10/25/2013 01:54   US Abdomen Limited Ruq  11/10/2013   CLINICAL DATA:  Right upper quadrant pain.  EXAM: US ABDOMEN LIMITED - RIGHT UPPER QUADRANT  COMPARISON:  CT ABD/PELV WO CM dated 10/25/2013  FINDINGS: Gallbladder:  No gallstones or wall thickening visualized. No sonographic Murphy sign noted.  Common bile duct:  Diameter: 4 mm, normal.  Liver:  No focal lesion identified. Within normal limits in parenchymal echogenicity.  IMPRESSION: Negative right upper quadrant ultrasound.   Electronically Signed   By: Dereck Ligas M.D.   On: 11/10/2013 18:13         Subjective: Patient continues to complain of nausea and intermittent vomiting although she did not have any emesis today. She tolerated her breakfast and lunch. Denies any fevers, chills, chest discomfort, shortness breath, dysuria and hematuria, diarrhea. She has intermittent abdominal pain.  Objective: Filed Vitals:   11/15/13 1635 11/15/13 1951 11/16/13 0431 11/16/13 0847  BP: 105/47 106/55 117/51 112/49  Pulse: 89 85 91 90  Temp: 98.7 F (37.1 C) 98.2 F (36.8 C) 98.1 F (36.7 C) 98 F (36.7 C)  TempSrc:  Oral Oral   Resp: 18 18 20 18   Height:      Weight:  74.798 kg (164 lb 14.4 oz)    SpO2: 97% 96% 95% 97%    Intake/Output Summary (Last 24 hours) at 11/16/13 1246 Last data filed at 11/16/13 0847  Gross per 24 hour  Intake  390 ml  Output      0 ml  Net    390 ml   Weight change: 1.542 kg (3 lb 6.4 oz) Exam:   General:  Pt is alert, follows commands appropriately, not in acute distress  HEENT: No icterus, No thrush,  [place]/AT  Cardiovascular: RRR, S1/S2, no rubs, no gallops  Respiratory: CTA bilaterally, no wheezing, no crackles, no rhonchi  Abdomen: Soft/+BS, mild RUQ, epigastric discomfort without guarding or peritoneal sign, non distended, no guarding  Extremities: trace LE edema, No lymphangitis, No petechiae, No rashes, no  synovitis  Data Reviewed: Basic Metabolic Panel:  Recent Labs Lab 11/10/13 2324  11/13/13 1010 11/13/13 1420 11/14/13 0405 11/15/13 0628 11/16/13 0520  NA 137  < > 135* 134* 141 136* 138  K 3.5*  < > 3.2* 3.5* 3.0* 3.7 3.4*  CL 98  < > 93* 93* 96 88* 87*  CO2 20  < > 19 24 32 32 35*  GLUCOSE 136*  < > 240* 256* 31* 390* 227*  BUN 28*  < > 7 7 7 15 17   CREATININE 1.18*  < > 0.97 0.95 1.00 1.09 1.25*  CALCIUM 9.1  < > 8.5 8.5 8.4 8.2* 8.8  MG 1.5  --   --   --   --   --   --   < > = values in this interval not displayed. Liver Function Tests:  Recent Labs Lab 11/10/13 1404 11/10/13 1623 11/16/13 0520  AST 14 12 10   ALT 8 8 7   ALKPHOS 195* 190* 217*  BILITOT 0.3 0.4 0.3  PROT 6.1 6.7 6.5  ALBUMIN  --  2.9* 2.9*    Recent Labs Lab 11/10/13 1404 11/10/13 1623  LIPASE 50 48   No results found for this basename: AMMONIA,  in the last 168 hours CBC:  Recent Labs Lab 11/10/13 1428 11/10/13 1623 11/10/13 1725 11/11/13 0223 11/16/13 0520  WBC 9.3 7.9  --  7.3 6.2  NEUTROABS  --  6.7  --   --   --   HGB 10.2* 10.5* 10.9* 9.1* 10.4*  HCT 32.1* 31.3* 32.0* 27.0* 31.2*  MCV 90.0 89.9  --  87.4 88.1  PLT  --  302  --  277 321   Cardiac Enzymes:  Recent Labs Lab 11/10/13 1712  TROPONINI <0.30   BNP: No components found with this basename: POCBNP,  CBG:  Recent Labs Lab 11/15/13 1333 11/15/13 1633 11/15/13 1953 11/16/13 0723 11/16/13 1158  GLUCAP 411* 134* 192* 335* 303*    Recent Results (from the past 240 hour(s))  MRSA PCR SCREENING     Status: None   Collection Time    11/10/13  9:46 PM      Result Value Ref Range Status   MRSA by PCR NEGATIVE  NEGATIVE Final   Comment:            The GeneXpert MRSA Assay (FDA     approved for NASAL specimens     only), is one component of a     comprehensive MRSA colonization     surveillance program. It is not     intended to diagnose MRSA     infection nor to guide or     monitor treatment for      MRSA infections.  URINE CULTURE     Status: None   Collection Time    11/11/13  8:44 PM      Result Value Ref Range Status  Specimen Description URINE, CLEAN CATCH   Final   Special Requests NONE   Final   Culture  Setup Time     Final   Value: 11/12/2013 01:12     Performed at [facility]     Final   Value: >=100,000 COLONIES/ML     Performed at [facility]   Culture     Final   Value: ESCHERICHIA COLI     Performed at [facility]   Report Status 11/13/2013 FINAL   Final   Organism ID, Bacteria ESCHERICHIA COLI   Final     Scheduled Meds: . aspirin EC  325 mg Oral Daily  . carvedilol  6.25 mg Oral BID WC  . cefTRIAXone (ROCEPHIN)  IV  1 g Intravenous Q24H  . clopidogrel  75 mg Oral Q breakfast  . enoxaparin (LOVENOX) injection  40 mg Subcutaneous Q24H  . escitalopram  10 mg Oral Daily  . fluconazole  150 mg Oral Q Sat  . [START ON 11/17/2013] furosemide  40 mg Oral Daily  . insulin aspart  0-5 Units Subcutaneous QHS  . insulin aspart  0-9 Units Subcutaneous TID WC  . insulin detemir  10 Units Subcutaneous Daily  . levothyroxine  125 mcg Oral QAC breakfast  . metoCLOPramide  5 mg Oral TID AC & HS  . nystatin cream   Topical BID  . pantoprazole  40 mg Oral BID  . spironolactone  12.5 mg Oral Daily   Continuous Infusions:    Bridget Rowen, DO  [facility] Pager [phone]  If 7PM-7AM, please contact night-coverage [email or web address] Password [email or web address] 11/16/2013, 12:46 PM   LOS: 6 days

## 2013-11-17 ENCOUNTER — Encounter (HOSPITAL_COMMUNITY): Payer: Self-pay | Admitting: *Deleted

## 2013-11-17 ENCOUNTER — Inpatient Hospital Stay (HOSPITAL_COMMUNITY): Payer: BC Managed Care – PPO

## 2013-11-17 ENCOUNTER — Encounter (HOSPITAL_COMMUNITY)
Admission: EM | Disposition: A | Discharge status: Home Health | Payer: BC Managed Care – PPO | Source: Home / Self Care | Attending: Internal Medicine

## 2013-11-17 DIAGNOSIS — R112 Nausea with vomiting, unspecified: Secondary | ICD-10-CM

## 2013-11-17 HISTORY — PX: ESOPHAGOGASTRODUODENOSCOPY: SHX5428

## 2013-11-17 LAB — GLUCOSE, CAPILLARY
GLUCOSE-CAPILLARY: 208 mg/dL — AB (ref 70–99)
GLUCOSE-CAPILLARY: 143 mg/dL — AB (ref 70–99)
GLUCOSE-CAPILLARY: 97 mg/dL (ref 70–99)
Glucose-Capillary: 381 mg/dL — ABNORMAL HIGH (ref 70–99)
Glucose-Capillary: 271 mg/dL — ABNORMAL HIGH (ref 70–99)
Glucose-Capillary: 121 mg/dL — ABNORMAL HIGH (ref 70–99)
Glucose-Capillary: 74 mg/dL (ref 70–99)

## 2013-11-17 LAB — COMPREHENSIVE METABOLIC PANEL
ALBUMIN: 2.8 g/dL — AB (ref 3.5–5.2)
ALK PHOS: 201 U/L — AB (ref 39–117)
ALT: 6 U/L (ref 0–35)
AST: 11 U/L (ref 0–37)
BILIRUBIN TOTAL: 0.4 mg/dL (ref 0.3–1.2)
BUN: 26 mg/dL — AB (ref 6–23)
CHLORIDE: 87 meq/L — AB (ref 96–112)
CO2: 34 mEq/L — ABNORMAL HIGH (ref 19–32)
Calcium: 8.7 mg/dL (ref 8.4–10.5)
Creatinine, Ser: 1.38 mg/dL — ABNORMAL HIGH (ref 0.50–1.10)
GFR calc Af Amer: 46 mL/min — ABNORMAL LOW (ref >90)
GFR calc non Af Amer: 40 mL/min — ABNORMAL LOW (ref >90)
Glucose, Bld: 298 mg/dL — ABNORMAL HIGH (ref 70–99)
POTASSIUM: 3.9 meq/L (ref 3.7–5.3)
SODIUM: 136 meq/L — AB (ref 137–147)
TOTAL PROTEIN: 6.2 g/dL (ref 6.0–8.3)

## 2013-11-17 LAB — MAGNESIUM: Magnesium: 1.4 mg/dL — ABNORMAL LOW (ref 1.5–2.5)

## 2013-11-17 SURGERY — EGD (ESOPHAGOGASTRODUODENOSCOPY)
Anesthesia: Moderate Sedation

## 2013-11-17 MED ORDER — BUTAMBEN-TETRACAINE-BENZOCAINE 2-2-14 % EX AERO
INHALATION_SPRAY | CUTANEOUS | Status: DC | PRN
  Administered 2013-11-17: 1 via TOPICAL

## 2013-11-17 MED ORDER — PEG-KCL-NACL-NASULF-NA ASC-C 100 G PO SOLR
0.5000 | Freq: Once | ORAL | Status: AC
  Administered 2013-11-18: 100 g via ORAL
  Filled 2013-11-17: qty 1

## 2013-11-17 MED ORDER — TECHNETIUM TC 99M MEBROFENIN IV KIT
5.0000 | PACK | Freq: Once | INTRAVENOUS | Status: AC | PRN
  Administered 2013-11-17: 5 via INTRAVENOUS

## 2013-11-17 MED ORDER — MIDAZOLAM HCL 10 MG/2ML IJ SOLN
INTRAMUSCULAR | Status: DC | PRN
  Administered 2013-11-17: 2 mg via INTRAVENOUS

## 2013-11-17 MED ORDER — MIDAZOLAM HCL 5 MG/ML IJ SOLN
INTRAMUSCULAR | Status: AC
  Filled 2013-11-17: qty 1

## 2013-11-17 MED ORDER — PEG-KCL-NACL-NASULF-NA ASC-C 100 G PO SOLR
0.5000 | Freq: Once | ORAL | Status: AC
  Administered 2013-11-17: 100 g via ORAL
  Filled 2013-11-17: qty 1

## 2013-11-17 MED ORDER — FENTANYL CITRATE 0.05 MG/ML IJ SOLN
INTRAMUSCULAR | Status: AC
  Filled 2013-11-17: qty 2

## 2013-11-17 MED ORDER — FENTANYL CITRATE 0.05 MG/ML IJ SOLN
INTRAMUSCULAR | Status: DC | PRN
  Administered 2013-11-17: 25 ug via INTRAVENOUS

## 2013-11-17 MED ORDER — PEG-KCL-NACL-NASULF-NA ASC-C 100 G PO SOLR: 1.0000 | Freq: Once | ORAL | Status: DC

## 2013-11-17 NOTE — Op Note (Signed)
[facility] [place], [address]   ENDOSCOPY PROCEDURE REPORT  PATIENT: Bridget, Martinez  MR#: [id number] BIRTHDATE: 1951/02/17 , 62  yrs. old GENDER: Female ENDOSCOPIST: Lafayette Dragon, MD REFERRED BY:  Dr Vassie Moment PROCEDURE DATE:  11/17/2013 PROCEDURE:  EGD, diagnostic ASA CLASS:     Class II INDICATIONS:  abdominal pain in the upper right quadrant. Vomiting. MEDICATIONS: These medications were titrated to patient response per physician's verbal order, Fentanyl 25 mcg IV, and Versed 2 mg IV TOPICAL ANESTHETIC: Cetacaine Spray  DESCRIPTION OF PROCEDURE: After the risks benefits and alternatives of the procedure were thoroughly explained, informed consent was obtained.  The Pentax Gastroscope [id number] endoscope was introduced through the mouth and advanced to the second portion of the duodenum. Without limitations.  The instrument was slowly withdrawn as the mucosa was fully examined.      Esophagus, upper mid and lower third of the esophagus appeared normal. There was no esophageal stricture. There was a small fibrous ring which was nonobstructing. There were no evidence of esophagitis.  Stomach: Gastric mucosa appeared unremarkable,. Rugal fortes were normal. Gastric antrum and pyloric outlet were normal. Retroflexion of the endoscope revealed normal fundus and cardia Duodenum: Duodenal bulb and descending duodenum was normal[ The scope was then withdrawn from the patient and the procedure completed.  COMPLICATIONS: There were no complications. ENDOSCOPIC IMPRESSION:  essentially normal upper endoscopy of esophagus stomach and duodenum Small reducible hiatal hernia Nothing to account for right upper quadrant abdominal pain RECOMMENDATIONS: since the pain seemed to be radiating to right lower quadrant I questioned possibility of followup pain coming from the right colon. We will proceed with colonoscopy REPEAT EXAM:  no  eSigned:  Lafayette Dragon, MD 11/17/2013 3:58 PM   CC:  PATIENT NAME:  Bridget, Martinez MR#: [id number]

## 2013-11-17 NOTE — Progress Notes (Signed)
[facility] PROGRESS NOTE  Bridget Martinez [id number] DOB: [birth year] DOA: 11/10/2013 PCP: [name], MD  Assessment/Plan: 1. Diabetic ketoacidosis, suspect may of been precipitated by noncompliance at home. Continue Levemir 15 units subcutaneous daily, with Accu-Cheks q. a.c. each bedtime. 2. Recurrent nausea and vomiting. Patient undergoing workup in the inpatient service which has included gastric emptying study interpreted as normal. She also had a hiatus scan performed which did not reveal evidence of biliary obstruction. GI consulted, undergoing upper endoscopy on 11/17/2013 which was unremarkable. GI recommending colonoscopy in a.m. 3. Chronic systolic congestive heart failure, transthoracic echocardiogram in January of 2015 show an ejection fraction of 30-35%. Continue Lasix 40 mg by mouth daily and Tolectin 12.5 mg by mouth daily. Currently compensated. 4. Coronary artery disease, continue medical management with Plavix aspirin and Coreg 5. Stage III chronic kidney disease. Baseline creatinine appears to be near 1.2-1.3. A.m. lab work on 11/17/2013 showed a creatinine 1.3 with BUN of 26. She remains on diuretic therapy, we'll monitor closely for now, repeat a.m. lab work.  Code Status: Full Code Family Communication:  Disposition Plan: Plan for colonosc   Consultants:  GI: Dr Olevia Perches  Procedures:  EGD performed on 11/17/2013     HPI/Subjective: Patient is a pleasant 63 year old female with a past medical history of poorly controlled diabetes mellitus, having multiple hospitalizations for diabetic ketoacidosis, most recently discharged on 10/29/2013 after receiving treatment for DKA, admitted to the medicine service on 11/10/2013 presenting with complaints of nausea vomiting, found to have a blood sugar of 650. She was started on IV insulin and placed on the DKA protocol. Blood sugars stabilizing by the following day as she was transitioned to basal insulin with  Levemir. With regard to recurrent vomiting, she initially had a gastric emptying study which was interpreted as normal. With her history of poorly-controlled diabetes mellitus there was suspicion for gastroparesis. This was further worked up with a hiatus scan which did not reveal evidence of biliary obstruction. Gastroenterology was consulted as she underwent EGD on 11/17/2013. This revealed a normal upper endoscopy of the esophagus stomach duodenum. GI recommending proceeding with colonoscopy.   Objective: Filed Vitals:   11/17/13 1605  BP: 119/47  Pulse: 73  Temp:   Resp: 17    Intake/Output Summary (Last 24 hours) at 11/17/13 1617 Last data filed at 11/17/13 1320  Gross per 24 hour  Intake    220 ml  Output      0 ml  Net    220 ml   Filed Weights   11/14/13 1600 11/15/13 1951 11/16/13 2021  Weight: 73.8 kg (162 lb 11.2 oz) 74.798 kg (164 lb 14.4 oz) 72.394 kg (159 lb 9.6 oz)    Exam General: Pt is alert, follows commands appropriately, not in acute distress  HEENT: No icterus, No thrush, [place]/AT  Cardiovascular: RRR, S1/S2, no rubs, no gallops  Respiratory: CTA bilaterally, no wheezing, no crackles, no rhonchi  Abdomen: Soft/+BS, mild RUQ, epigastric discomfort without guarding or peritoneal sign, non distended, no guarding  Extremities: trace LE edema, No lymphangitis, No petechiae, No rashes, no synovitis   Panel:  Recent Labs Lab 11/10/13 2324  11/13/13 1420 11/14/13 0405 11/15/13 0628 11/16/13 0520 11/17/13 0645  NA 137  < > 134* 141 136* 138 136*  K 3.5*  < > 3.5* 3.0* 3.7 3.4* 3.9  CL 98  < > 93* 96 88* 87* 87*  CO2 20  < > 24 32 32 35* 34*  GLUCOSE 136*  < >  256* 31* 390* 227* 298*  BUN 28*  < > 7 7 15 17  26*  CREATININE 1.18*  < > 0.95 1.00 1.09 1.25* 1.38*  CALCIUM 9.1  < > 8.5 8.4 8.2* 8.8 8.7  MG 1.5  --   --   --   --   --  1.4*  < > = values in this interval not displayed. Liver Function Tests:  Recent Labs Lab 11/10/13 1623 11/16/13 0520  11/17/13 0645  AST 12 10 11   ALT 8 7 6   ALKPHOS 190* 217* 201*  BILITOT 0.4 0.3 0.4  PROT 6.7 6.5 6.2  ALBUMIN 2.9* 2.9* 2.8*    Recent Labs Lab 11/10/13 1623  LIPASE 48   No results found for this basename: AMMONIA,  in the last 168 hours CBC:  Recent Labs Lab 11/10/13 1623 11/10/13 1725 11/11/13 0223 11/16/13 0520  WBC 7.9  --  7.3 6.2  NEUTROABS 6.7  --   --   --   HGB 10.5* 10.9* 9.1* 10.4*  HCT 31.3* 32.0* 27.0* 31.2*  MCV 89.9  --  87.4 88.1  PLT 302  --  277 321   Cardiac Enzymes:  Recent Labs Lab 11/10/13 1712  TROPONINI <0.30   BNP (last 3 results)  Recent Labs  10/28/13 0546 11/14/13 0405  PROBNP 11269.0* 1470.0*   CBG:  Recent Labs Lab 11/17/13 1100 11/17/13 1130 11/17/13 1250 11/17/13 1336 11/17/13 1443  GLUCAP 271* 208* 143* 121* 97    Recent Results (from the past 240 hour(s))  MRSA PCR SCREENING     Status: None   Collection Time    11/10/13  9:46 PM      Result Value Ref Range Status   MRSA by PCR NEGATIVE  NEGATIVE Final   Comment:            The GeneXpert MRSA Assay (FDA     approved for NASAL specimens     only), is one component of a     comprehensive MRSA colonization     surveillance program. It is not     intended to diagnose MRSA     infection nor to guide or     monitor treatment for     MRSA infections.  URINE CULTURE     Status: None   Collection Time    11/11/13  8:44 PM      Result Value Ref Range Status   Specimen Description URINE, CLEAN CATCH   Final   Special Requests NONE   Final   Culture  Setup Time     Final   Value: 11/12/2013 01:12     Performed at [facility]     Final   Value: >=100,000 COLONIES/ML     Performed at [facility]   Culture     Final   Value: ESCHERICHIA COLI     Performed at [facility]   Report Status 11/13/2013 FINAL   Final   Organism ID, Bacteria ESCHERICHIA COLI   Final     Studies: Nm Hepatobiliary Liver Func  11/17/2013    CLINICAL DATA Abdominal pain  EXAM NUCLEAR MEDICINE HEPATOBILIARY IMAGING  TECHNIQUE Sequential images of the abdomen were obtained out to 60 minutes following intravenous administration of radiopharmaceutical.  RADIOPHARMACEUTICALS 5.0  MCiTc-33m Choletec  COMPARISON NM HEPATO W/GB/PHARM/QUAN MEASUR dated 08/08/2011  FINDINGS There is immediate homogeneous uptake of radiotracer in the liver. Filling of the gallbladder begins at 35 minutes. Radiotracer  uptake is present in the small bowel at 20 minutes.  At the end of 1-hour, there is relatively good clearance of activity from the liver.  IMPRESSION No biliary obstruction.  SIGNATURE  Electronically Signed   By: Kathreen Devoid   On: 11/17/2013 10:00    Scheduled Meds: . [facility] HOLD] aspirin EC  325 mg Oral Daily  . [facility] HOLD] carvedilol  6.25 mg Oral BID WC  . [facility] HOLD] clopidogrel  75 mg Oral Q breakfast  . [[facility] HOLD] enoxaparin (LOVENOX) injection  40 mg Subcutaneous Q24H  . [[facility] HOLD] escitalopram  10 mg Oral Daily  . [facility] HOLD] fluconazole  150 mg Oral Q Sat  . [facility] HOLD] furosemide  40 mg Oral Daily  . [[facility] HOLD] insulin aspart  0-5 Units Subcutaneous QHS  . [[facility] HOLD] insulin aspart  0-9 Units Subcutaneous TID WC  . [[facility] HOLD] insulin detemir  13 Units Subcutaneous Daily  . [facility] HOLD] levothyroxine  125 mcg Oral QAC breakfast  . [[facility] HOLD] metoCLOPramide  5 mg Oral TID AC & HS  . [[facility] HOLD] nystatin cream   Topical BID  . [[facility] HOLD] pantoprazole  40 mg Oral BID  . [facility] HOLD] spironolactone  12.5 mg Oral Daily   Continuous Infusions: . sodium chloride      Principal Problem:   Acute on chronic systolic heart failure Active Problems:   Hyperlipidemia   Depression   CAD, multiple vessel   Chronic systolic congestive heart failure, NYHA class 3   DKA (diabetic ketoacidoses)   Hypertension   Yeast infection of the vagina   UTI (urinary tract infection)   CKD (chronic kidney disease), stage III   Diabetic gastroparesis   DM  (diabetes mellitus), type 1, uncontrolled w/neurologic complication   Acute on chronic renal failure    Time spent: 35 min    Kelvin Cellar  [facility] Pager [phone]. If 7PM-7AM, please contact night-coverage at [email or web address], password [facility] 11/17/2013, 4:17 PM  LOS: 7 days

## 2013-11-18 ENCOUNTER — Encounter (HOSPITAL_COMMUNITY)
Admission: EM | Disposition: A | Discharge status: Home Health | Payer: Self-pay | Source: Home / Self Care | Attending: Internal Medicine

## 2013-11-18 ENCOUNTER — Encounter (HOSPITAL_COMMUNITY): Payer: Self-pay | Admitting: Internal Medicine

## 2013-11-18 DIAGNOSIS — R1031 Right lower quadrant pain: Secondary | ICD-10-CM

## 2013-11-18 HISTORY — PX: COLONOSCOPY: SHX5424

## 2013-11-18 LAB — BASIC METABOLIC PANEL
BUN: 22 mg/dL (ref 6–23)
CO2: 34 meq/L — AB (ref 19–32)
Calcium: 8.8 mg/dL (ref 8.4–10.5)
Chloride: 90 mEq/L — ABNORMAL LOW (ref 96–112)
Creatinine, Ser: 1.13 mg/dL — ABNORMAL HIGH (ref 0.50–1.10)
GFR calc Af Amer: 59 mL/min — ABNORMAL LOW (ref >90)
GFR calc non Af Amer: 51 mL/min — ABNORMAL LOW (ref >90)
Glucose, Bld: 86 mg/dL (ref 70–99)
Potassium: 3.3 mEq/L — ABNORMAL LOW (ref 3.7–5.3)
SODIUM: 139 meq/L (ref 137–147)

## 2013-11-18 LAB — CBC
HCT: 29.8 % — ABNORMAL LOW (ref 36.0–46.0)
Hemoglobin: 9.9 g/dL — ABNORMAL LOW (ref 12.0–15.0)
MCH: 29.9 pg (ref 26.0–34.0)
MCHC: 33.2 g/dL (ref 30.0–36.0)
MCV: 90 fL (ref 78.0–100.0)
PLATELETS: 318 10*3/uL (ref 150–400)
RBC: 3.31 MIL/uL — AB (ref 3.87–5.11)
RDW: 14.5 % (ref 11.5–15.5)
WBC: 5.3 10*3/uL (ref 4.0–10.5)

## 2013-11-18 LAB — GLUCOSE, CAPILLARY
GLUCOSE-CAPILLARY: 186 mg/dL — AB (ref 70–99)
GLUCOSE-CAPILLARY: 198 mg/dL — AB (ref 70–99)
GLUCOSE-CAPILLARY: 76 mg/dL (ref 70–99)
Glucose-Capillary: 197 mg/dL — ABNORMAL HIGH (ref 70–99)
Glucose-Capillary: 154 mg/dL — ABNORMAL HIGH (ref 70–99)

## 2013-11-18 SURGERY — COLONOSCOPY
Anesthesia: Moderate Sedation

## 2013-11-18 MED ORDER — METOCLOPRAMIDE HCL 10 MG PO TABS
10.0000 mg | ORAL_TABLET | Freq: Three times a day (TID) | ORAL | Status: DC
  Administered 2013-11-18 – 2013-11-19 (×4): 10 mg via ORAL
  Filled 2013-11-18 (×4): qty 1

## 2013-11-18 MED ORDER — MIDAZOLAM HCL 5 MG/ML IJ SOLN
INTRAMUSCULAR | Status: AC
  Filled 2013-11-18: qty 3

## 2013-11-18 MED ORDER — MIDAZOLAM HCL 5 MG/ML IJ SOLN
INTRAMUSCULAR | Status: AC
  Filled 2013-11-18: qty 2

## 2013-11-18 MED ORDER — FENTANYL CITRATE 0.05 MG/ML IJ SOLN
INTRAMUSCULAR | Status: AC
  Filled 2013-11-18: qty 2

## 2013-11-18 MED ORDER — DIPHENHYDRAMINE HCL 50 MG/ML IJ SOLN
INTRAMUSCULAR | Status: AC
  Filled 2013-11-18: qty 1

## 2013-11-18 MED ORDER — INSULIN DETEMIR 100 UNIT/ML ~~LOC~~ SOLN
15.0000 [IU] | Freq: Every day | SUBCUTANEOUS | Status: DC
  Administered 2013-11-18: 15 [IU] via SUBCUTANEOUS
  Filled 2013-11-18 (×2): qty 0.15

## 2013-11-18 MED ORDER — MIDAZOLAM HCL 5 MG/5ML IJ SOLN
INTRAMUSCULAR | Status: DC | PRN
  Administered 2013-11-18: 1 mg via INTRAVENOUS
  Administered 2013-11-18: 2 mg via INTRAVENOUS

## 2013-11-18 MED ORDER — FENTANYL CITRATE 0.05 MG/ML IJ SOLN
INTRAMUSCULAR | Status: DC | PRN
  Administered 2013-11-18: 25 ug via INTRAVENOUS

## 2013-11-18 MED ORDER — HYOSCYAMINE SULFATE 0.125 MG SL SUBL
0.1250 mg | SUBLINGUAL_TABLET | Freq: Four times a day (QID) | SUBLINGUAL | Status: DC | PRN
  Filled 2013-11-18: qty 1

## 2013-11-18 NOTE — Op Note (Signed)
[facility] [place], [id number]   COLONOSCOPY PROCEDURE REPORT  PATIENT: Bridget Martinez, Bridget Martinez  MR#: [id number] BIRTHDATE: 05-Mar-1951 , 51  yrs. old GENDER: Female ENDOSCOPIST: Lafayette Dragon, MD REFERRED BY:Dr Ez.Zamora PROCEDURE DATE:  11/18/2013 PROCEDURE:   Colonoscopy, diagnostic First Screening Colonoscopy - Avg.  risk and is 50 yrs.  old or older Yes.  Prior Negative Screening - Now for repeat screening. N/A  History of Adenoma - Now for follow-up colonoscopy & has been > or = to 3 yrs.  N/A  Polyps Removed Today? No.  Recommend repeat exam, <10 yrs? No. ASA CLASS:   Class III INDICATIONS:abdominal pain in the upper right quadrant, abdominal pain in the lower right quadrant, and diabetic with stage III chronic kidney disease,.  Gallbladder studies negative.  CT scan of the abdomen no active process never had a colonoscopy. MEDICATIONS: These medications were titrated to patient response per physician's verbal order, Fentanyl 25 mcg IV, and Versed 2 mg IV  DESCRIPTION OF PROCEDURE:   After the risks benefits and alternatives of the procedure were thoroughly explained, informed consent was obtained.  A digital rectal exam revealed no abnormalities of the rectum.   The Pentax Ped Colon [id number] endoscope was introduced through the anus and advanced to the cecum, which was identified by both the appendix and ileocecal valve. No adverse events experienced.   The quality of the prep was good, using MoviPrep  The instrument was then slowly withdrawn as the colon was fully examined.      COLON FINDINGS: A normal appearing cecum, ileocecal valve, and appendiceal orifice were identified.  The ascending, hepatic flexure, transverse, splenic flexure, descending, sigmoid colon and rectum appeared unremarkable.  No polyps or cancers were seen. Retroflexed views revealed no abnormalities. The time to cecum=8 minutes 21 seconds.  Withdrawal  time=7 minutes 20 seconds.  The scope was withdrawn and the procedure completed. COMPLICATIONS: There were no complications.  ENDOSCOPIC IMPRESSION: Normal colon , redundant transverse colon  RECOMMENDATIONS: trial Levsin SL.125 mg prn, Miralax 9 gm 3x/week OK to discharge from GI standpoint   eSigned:  Lafayette Dragon, MD 11/18/2013 2:17 PM   cc:   PATIENT NAME:  Bridget Martinez, Bridget Martinez MR#: [id number]

## 2013-11-18 NOTE — Progress Notes (Signed)
[facility] PROGRESS NOTE  Bridget Martinez [id number] DOB: 1951-04-04 DOA: 11/10/2013 PCP: [name], MD  Assessment/Plan: 1. Diabetic ketoacidosis, suspect may of been precipitated by noncompliance at home. Continue Levemir 15 units subcutaneous daily, with Accu-Cheks q. a.c. each bedtime. 2. Recurrent nausea and vomiting. Patient undergoing workup in the inpatient service which has included gastric emptying study interpreted as normal. She also had a hiatus scan performed which did not reveal evidence of biliary obstruction. GI consulted, undergoing upper endoscopy on 11/17/2013 which was unremarkable. Colonoscopy unremarkable.  3. Chronic systolic congestive heart failure, transthoracic echocardiogram in January of 2015 show an ejection fraction of 30-35%. Continue Lasix 40 mg by mouth daily and Coreg 6.25 mg BID. Currently compensated. 4. Coronary artery disease, continue medical management with Plavix aspirin and Coreg 5. Stage III chronic kidney disease. Baseline creatinine appears to be near 1.2-1.3.Kidney function stable.   Code Status: Full Code Family Communication:  Disposition Plan: Plan for colonosc   Consultants:  GI: Dr Olevia Perches  Procedures:  EGD performed on 11/17/2013  Colonoscopy performed on 11/18/13      HPI/Subjective: Patient is a pleasant 63 year old female with a past medical history of poorly controlled diabetes mellitus, having multiple hospitalizations for diabetic ketoacidosis, most recently discharged on 10/29/2013 after receiving treatment for DKA, admitted to the medicine service on 11/10/2013 presenting with complaints of nausea vomiting, found to have a blood sugar of 650. She was started on IV insulin and placed on the DKA protocol. Blood sugars stabilizing by the following day as she was transitioned to basal insulin with Levemir. With regard to recurrent vomiting, she initially had a gastric emptying study which was interpreted as normal.  With her history of poorly-controlled diabetes mellitus there was suspicion for gastroparesis. This was further worked up with a hiatus scan which did not reveal evidence of biliary obstruction. Gastroenterology was consulted as she underwent EGD on 11/17/2013. This revealed a normal upper endoscopy of the esophagus stomach duodenum. She underwent colonoscopy on 11/18/2013 which was unremarkable. GI recommending trial with Levsin and MiraLAX 3 times a week.  Objective: Filed Vitals:   11/18/13 1454  BP: 107/46  Pulse: 75  Temp: 98.4 F (36.9 C)  Resp: 16    Intake/Output Summary (Last 24 hours) at 11/18/13 1746 Last data filed at 11/18/13 1455  Gross per 24 hour  Intake    970 ml  Output      2 ml  Net    968 ml   Filed Weights   11/15/13 1951 11/16/13 2021 11/17/13 2221  Weight: 74.798 kg (164 lb 14.4 oz) 72.394 kg (159 lb 9.6 oz) 71.351 kg (157 lb 4.8 oz)    Exam General: Pt is alert, follows commands appropriately, not in acute distress  HEENT: No icterus, No thrush, [place]/AT  Cardiovascular: RRR, S1/S2, no rubs, no gallops  Respiratory: CTA bilaterally, no wheezing, no crackles, no rhonchi  Abdomen: Soft/+BS, mild RUQ, epigastric discomfort without guarding or peritoneal sign, non distended, no guarding  Extremities: trace LE edema, No lymphangitis, No petechiae, No rashes, no synovitis   Panel:  Recent Labs Lab 11/14/13 0405 11/15/13 0628 11/16/13 0520 11/17/13 0645 11/18/13 0445  NA 141 136* 138 136* 139  K 3.0* 3.7 3.4* 3.9 3.3*  CL 96 88* 87* 87* 90*  CO2 32 32 35* 34* 34*  GLUCOSE 31* 390* 227* 298* 86  BUN 7 15 17  26* 22  CREATININE 1.00 1.09 1.25* 1.38* 1.13*  CALCIUM 8.4 8.2* 8.8 8.7 8.8  MG  --   --   --  1.4*  --    Liver Function Tests:  Recent Labs Lab 11/16/13 0520 11/17/13 0645  AST 10 11  ALT 7 6  ALKPHOS 217* 201*  BILITOT 0.3 0.4  PROT 6.5 6.2  ALBUMIN 2.9* 2.8*   No results found for this basename: LIPASE, AMYLASE,  in the last 168  hours No results found for this basename: AMMONIA,  in the last 168 hours CBC:  Recent Labs Lab 11/16/13 0520 11/18/13 0445  WBC 6.2 5.3  HGB 10.4* 9.9*  HCT 31.2* 29.8*  MCV 88.1 90.0  PLT 321 318   Cardiac Enzymes: No results found for this basename: CKTOTAL, CKMB, CKMBINDEX, TROPONINI,  in the last 168 hours BNP (last 3 results)  Recent Labs  10/28/13 0546 11/14/13 0405  PROBNP 11269.0* 1470.0*   CBG:  Recent Labs Lab 11/17/13 1622 11/17/13 2220 11/18/13 0725 11/18/13 1135 11/18/13 1700  GLUCAP 74 186* 198* 197* 154*    Recent Results (from the past 240 hour(s))  MRSA PCR SCREENING     Status: None   Collection Time    11/10/13  9:46 PM      Result Value Ref Range Status   MRSA by PCR NEGATIVE  NEGATIVE Final   Comment:            The GeneXpert MRSA Assay (FDA     approved for NASAL specimens     only), is one component of a     comprehensive MRSA colonization     surveillance program. It is not     intended to diagnose MRSA     infection nor to guide or     monitor treatment for     MRSA infections.  URINE CULTURE     Status: None   Collection Time    11/11/13  8:44 PM      Result Value Ref Range Status   Specimen Description URINE, CLEAN CATCH   Final   Special Requests NONE   Final   Culture  Setup Time     Final   Value: 11/12/2013 01:12     Performed at [place]     Final   Value: >=100,000 COLONIES/ML     Performed at [facility]   Culture     Final   Value: ESCHERICHIA COLI     Performed at [facility]   Report Status 11/13/2013 FINAL   Final   Organism ID, Bacteria ESCHERICHIA COLI   Final     Studies: Nm Hepatobiliary Liver Func  11/17/2013   CLINICAL DATA Abdominal pain  EXAM NUCLEAR MEDICINE HEPATOBILIARY IMAGING  TECHNIQUE Sequential images of the abdomen were obtained out to 60 minutes following intravenous administration of radiopharmaceutical.  RADIOPHARMACEUTICALS 5.0  MCiTc-11m  Choletec  COMPARISON NM HEPATO W/GB/PHARM/QUAN MEASUR dated 08/08/2011  FINDINGS There is immediate homogeneous uptake of radiotracer in the liver. Filling of the gallbladder begins at 35 minutes. Radiotracer uptake is present in the small bowel at 20 minutes.  At the end of 1-hour, there is relatively good clearance of activity from the liver.  IMPRESSION No biliary obstruction.  SIGNATURE  Electronically Signed   By: Kathreen Devoid   On: 11/17/2013 10:00    Scheduled Meds: . aspirin EC  325 mg Oral Daily  . carvedilol  6.25 mg Oral BID WC  . clopidogrel  75 mg Oral Q breakfast  . enoxaparin (LOVENOX) injection  40 mg Subcutaneous Q24H  . escitalopram  10 mg Oral Daily  . fluconazole  150 mg Oral Q Sat  . furosemide  40 mg Oral Daily  . insulin aspart  0-5 Units Subcutaneous QHS  . insulin aspart  0-9 Units Subcutaneous TID WC  . insulin detemir  15 Units Subcutaneous QHS  . levothyroxine  125 mcg Oral QAC breakfast  . metoCLOPramide  5 mg Oral TID AC & HS  . nystatin cream   Topical BID  . pantoprazole  40 mg Oral BID  . spironolactone  12.5 mg Oral Daily   Continuous Infusions:    Principal Problem:   Acute on chronic systolic heart failure Active Problems:   Hyperlipidemia   Depression   CAD, multiple vessel   Chronic systolic congestive heart failure, NYHA class 3   DKA (diabetic ketoacidoses)   Hypertension   Yeast infection of the vagina   UTI (urinary tract infection)   CKD (chronic kidney disease), stage III   Diabetic gastroparesis   DM (diabetes mellitus), type 1, uncontrolled w/neurologic complication   Acute on chronic renal failure   Abdominal pain, right lower quadrant    Time spent: 35 min    Briston Lax  [facility] Pager [phone]. If 7PM-7AM, please contact night-coverage at [email or web address], password [facility] 11/18/2013, 5:46 PM  LOS: 8 days

## 2013-11-19 ENCOUNTER — Encounter (HOSPITAL_COMMUNITY): Payer: Self-pay | Admitting: Internal Medicine

## 2013-11-19 DIAGNOSIS — R110 Nausea: Secondary | ICD-10-CM | POA: Diagnosis present

## 2013-11-19 DIAGNOSIS — R112 Nausea with vomiting, unspecified: Secondary | ICD-10-CM | POA: Diagnosis present

## 2013-11-19 LAB — GLUCOSE, CAPILLARY
GLUCOSE-CAPILLARY: 97 mg/dL (ref 70–99)
Glucose-Capillary: 33 mg/dL — CL (ref 70–99)
Glucose-Capillary: 157 mg/dL — ABNORMAL HIGH (ref 70–99)
Glucose-Capillary: 225 mg/dL — ABNORMAL HIGH (ref 70–99)

## 2013-11-19 MED ORDER — HYOSCYAMINE SULFATE 0.125 MG SL SUBL: 0.1250 mg | SUBLINGUAL_TABLET | Freq: Four times a day (QID) | SUBLINGUAL | Status: DC | PRN

## 2013-11-19 MED ORDER — METOCLOPRAMIDE HCL 10 MG PO TABS: 10.0000 mg | ORAL_TABLET | Freq: Three times a day (TID) | ORAL | Status: DC

## 2013-11-19 MED ORDER — GLUCOSE 40 % PO GEL: 1.0000 | ORAL | Status: DC | PRN

## 2013-11-19 MED ORDER — GLUCOSE-VITAMIN C 4-6 GM-MG PO CHEW: 4.0000 | CHEWABLE_TABLET | ORAL | Status: DC | PRN

## 2013-11-19 MED ORDER — INSULIN DETEMIR 100 UNIT/ML ~~LOC~~ SOLN: 15.0000 [IU] | Freq: Every day | SUBCUTANEOUS | Status: DC

## 2013-11-19 NOTE — Progress Notes (Signed)
Patient discharged per protocol.  Left via wheelchair with husband.  Discharge summary reviewed and all questions answered.  Discharge prescriptions given to patient.  PIV removed.  Earleen Reaper RN-BC, [employer]

## 2013-11-19 NOTE — Progress Notes (Signed)
Hypoglycemic Event  CBG: 33  Treatment: 15 GM carbohydrate snack  Symptoms: Shaky and Hungry  Follow-up CBG: [id number] CBG Result:97  Possible Reasons for Event: Inadequate meal intake     Bridget Martinez O  Remember to initiate Hypoglycemia Order Set & complete

## 2013-11-19 NOTE — Discharge Summary (Signed)
Physician Discharge Summary  Bridget Martinez [id number] DOB: 11/16/1950 DOA: 11/10/2013  PCP: [name], MD  Admit date: 11/10/2013 Discharge date: 11/19/2013  Time spent: 35 minutes  Recommendations for Outpatient Follow-up:  1.   Discharge Diagnoses:  Principal Problem:   DKA (diabetic ketoacidoses) Active Problems:   Nausea with vomiting   CKD (chronic kidney disease), stage III   Diabetic gastroparesis   Abdominal pain, right lower quadrant   Hyperlipidemia   Depression   CAD, multiple vessel   Chronic systolic congestive heart failure, NYHA class 3   Hypertension   Yeast infection of the vagina   UTI (urinary tract infection)   Acute on chronic systolic heart failure   DM (diabetes mellitus), type 1, uncontrolled w/neurologic complication   Acute on chronic renal failure   Discharge Condition: Stable  Diet recommendation: Carb Consistent Diet  Filed Weights   11/16/13 2021 11/17/13 2221 11/18/13 2054  Weight: 72.394 kg (159 lb 9.6 oz) 71.351 kg (157 lb 4.8 oz) 71.3 kg (157 lb 3 oz)    History of present illness:  Bridget Martinez is a 63 y.o. female with PMH relevant for CAD, post MI, Chronic systolic CHF, HTN, dyslipidemia, IDDM, 2 recent admissions for DKA, was discharged from [facility]/20 after treatment for DKA, also treated then for bronchitis and vulvo-vaginal yeast infection.  She reports feeling well for about a week after discharge and then this last week started experiencing, nausea, intermittent vomiting, vague r sided abdominal discomfort.  No fevers or chills, no new respiratory symptoms.  No chest pain, dyspnea  Her PO intake has been poor and hence has cut down her Novolog dose, but reports being compliant with her Levemir.  Her CBGs have fluctuated up and down to 22 this am.  [name] to her PCPs office, blood work concerning for acidosis again and hence sent to the ER.   Hospital Course:  Patient is a pleasant 63 year old female with a past medical  history of poorly controlled diabetes mellitus, having multiple hospitalizations for diabetic ketoacidosis, most recently discharged on 10/29/2013 after receiving treatment for DKA, admitted to the medicine service on 11/10/2013 presenting with complaints of nausea vomiting, found to have a blood sugar of 650. She was started on IV insulin and placed on the DKA protocol. Blood sugars stabilizing by the following day as she was transitioned to basal insulin with Levemir. With regard to recurrent vomiting, she initially had a gastric emptying study which was interpreted as normal. With her history of poorly-controlled diabetes mellitus there was suspicion for gastroparesis. This was further worked up with a hiatus scan which did not reveal evidence of biliary obstruction. Gastroenterology was consulted as she underwent EGD on 11/17/2013. This revealed a normal upper endoscopy of the esophagus stomach duodenum. She underwent colonoscopy on 11/18/2013 which was unremarkable. GI recommending trial with Levsin and MiraLAX 3 times a week. By 11/19/2013 she reported feeling much better, tolerating by mouth intake and stated feeling much better. She felt well up to go home. She was discharged in stable condition to her home on 11/19/2013.  Procedures:  EGD performed on 11/17/2013  Colonoscopy performed on 11/19/2011  Consultations:  Gastroenterology  Discharge Exam: Filed Vitals:   11/19/13 1148  BP: 114/60  Pulse: 81  Temp: 98.5 F (36.9 C)  Resp: 16   General: Pt is alert, follows commands appropriately, not in acute distress  HEENT: No icterus, No thrush, [place]/AT  Cardiovascular: RRR, S1/S2, no rubs, no gallops  Respiratory: CTA bilaterally,  no wheezing, no crackles, no rhonchi  Abdomen: Soft/+BS, mild RUQ, epigastric discomfort without guarding or peritoneal sign, non distended, no guarding  Extremities: trace LE edema, No lymphangitis, No petechiae, No rashes, no synovitis   Discharge  Instructions      Discharge Orders   Future Appointments Provider Department Dept Phone   03/28/2014 8:00 AM Lysbeth Penner, [facility] [phone]   Future Orders Complete By Expires   Call MD for:  difficulty breathing, headache or visual disturbances  As directed    Call MD for:  extreme fatigue  As directed    Call MD for:  persistant dizziness or light-headedness  As directed    Call MD for:  persistant nausea and vomiting  As directed    Call MD for:  severe uncontrolled pain  As directed    Call MD for:  temperature >100.4  As directed    Diet - low sodium heart healthy  As directed    Discharge instructions  As directed    Comments:     Please check your blood sugars three times a day and report these to your endocrinologist   Increase activity slowly  As directed        Medication List    STOP taking these medications       enalapril 2.5 MG tablet  Commonly known as:  VASOTEC     potassium chloride 10 MEQ tablet  Commonly known as:  K-DUR     Vitamin D (Ergocalciferol) 50000 UNITS Caps capsule  Commonly known as:  DRISDOL      TAKE these medications       alendronate 70 MG tablet  Commonly known as:  FOSAMAX  Take 1 tablet (70 mg total) by mouth once a week.     aspirin EC 325 MG tablet  Take 325 mg by mouth daily.     carvedilol 6.25 MG tablet  Commonly known as:  COREG  Take 1 tablet (6.25 mg total) by mouth 2 (two) times daily with a meal.     clopidogrel 75 MG tablet  Commonly known as:  PLAVIX  Take 75 mg by mouth daily with breakfast.     escitalopram 10 MG tablet  Commonly known as:  LEXAPRO  Take 1 tablet (10 mg total) by mouth daily.     fluconazole 150 MG tablet  Commonly known as:  DIFLUCAN  Take 1 tablet (150 mg total) by mouth once a week.     furosemide 40 MG tablet  Commonly known as:  LASIX  Take 1 tablet (40 mg total) by mouth daily.     hyoscyamine 0.125 MG SL tablet  Commonly known as:  LEVSIN  SL  Place 1 tablet (0.125 mg total) under the tongue every 6 (six) hours as needed for cramping.     insulin aspart 100 UNIT/ML injection  Commonly known as:  novoLOG  Inject 100 Units into the skin 3 (three) times daily with meals. 14 units at breakfast, 12 units at lunch, 7 units supper Plus 1 unit for every 35mg /dL above 100 mg/dL     insulin detemir 100 UNIT/ML injection  Commonly known as:  LEVEMIR  Inject 0.15 mLs (15 Units total) into the skin at bedtime.     levothyroxine 125 MCG tablet  Commonly known as:  SYNTHROID, LEVOTHROID  Take 1 tablet (125 mcg total) by mouth daily.     metoCLOPramide 10 MG tablet  Commonly known as:  REGLAN  Take 1 tablet (10 mg total) by mouth 4 (four) times daily -  before meals and at bedtime.     nystatin cream  Commonly known as:  MYCOSTATIN  Apply topically 2 (two) times daily.     ondansetron 8 MG disintegrating tablet  Commonly known as:  ZOFRAN ODT  Take 1 tablet (8 mg total) by mouth every 8 (eight) hours as needed for nausea or vomiting.     pantoprazole 40 MG tablet  Commonly known as:  PROTONIX  Take 40 mg by mouth daily.     rosuvastatin 40 MG tablet  Commonly known as:  CRESTOR  Take 1 tablet (40 mg total) by mouth daily.     spironolactone 25 MG tablet  Commonly known as:  ALDACTONE  Take 12.5 mg by mouth daily.       Allergies  Allergen Reactions  . Sulfa Antibiotics Anaphylaxis and Swelling    Stiff neck also    Follow-up Information   Follow up with [facility]. (RN disease management DKA, CHF)    Contact information:   [place] [address] [phone]       Follow up with [name], MD In 1 week.   Specialty:  Family Medicine   Contact information:   [address] [place] [address] [phone]        The results of significant diagnostics from this hospitalization (including imaging, microbiology, ancillary and laboratory) are listed below for  reference.    Significant Diagnostic Studies: Ct Abdomen Pelvis Wo Contrast  10/25/2013   CLINICAL DATA:  Nausea, vomiting, DKA  EXAM: CT ABDOMEN AND PELVIS WITHOUT CONTRAST  TECHNIQUE: Multidetector CT imaging of the abdomen and pelvis was performed following the standard protocol without intravenous contrast.  COMPARISON:  Prior ultrasound from 05/23/2012  FINDINGS: The study is degraded by motion artifact.  The visualized lung bases are grossly clear.  Limited noncontrast evaluation of the liver is unremarkable. Gallbladder is within normal limits. No biliary ductal dilatation. The spleen, adrenal glands, and pancreas demonstrate a normal unenhanced appearance.  The kidneys are equal in size without evidence of nephrolithiasis or hydronephrosis.  The bowels are grossly unremarkable without evidence of obstruction or inflammatory changes. Scattered colonic diverticula are present without acute diverticulitis. Appendix is visualized within the right lower quadrant and is of normal caliber without definite inflammatory changes.  Bladder is decompressed with a Foley catheter in place. Gas lucency within the bladder likely related to catheterization. The uterus and ovaries are within normal limits for patient age.  No free air or fluid identified. No enlarged intra-abdominal pelvic lymph nodes. There is asymmetric mild inflammatory soft tissue stranding within the subcutaneous fat in the left inguinal region adjacent to the left femoral artery (series 2, image 88). Finding is of uncertain significance. No loculated collection.  Moderate atherosclerotic disease seen within the aorta and bilateral iliac arteries.  No acute osseous abnormality identified. Multilevel degenerative changes noted within the visualized spine. No worrisome lytic or blastic osseous lesions.  IMPRESSION: 1. No CT evidence of acute intra-abdominal or pelvic process. 2. Asymmetric soft tissue stranding within the subcutaneous fat of the left  inguinal region adjacent to the left femoral artery. This finding is of uncertain clinical significance, and may be 3. Colonic diverticulosis without acute diverticulitis. 4. Moderate aorto bi-iliac atherosclerotic disease.   Electronically Signed   By: Jeannine Boga M.D.   On: 10/25/2013 05:42   Dg Chest 2 View  11/10/2013  CLINICAL DATA:  Weakness. Nausea vomiting. Diabetic. High blood pressure.  EXAM: CHEST  2 VIEW  COMPARISON:  10/28/2013.  FINDINGS: Post CABG.  Minimal peribronchial thickening and central pulmonary vascular prominence without pulmonary edema, segmental infiltrate or pneumothorax.  Calcified aorta.  IMPRESSION: No evidence of congestive heart failure or pneumonia. Please see above.   Electronically Signed   By: Chauncey Cruel M.D.   On: 11/10/2013 20:22   Dg Chest 2 View  10/28/2013   CLINICAL DATA:  Cough, clinical pneumonia  EXAM: CHEST  2 VIEW  COMPARISON:  DG CHEST 1V PORT dated 10/26/2013  FINDINGS: The lungs are well-expanded. There is no focal infiltrate. Tiny pleural effusions blunt the costophrenic angles bilaterally. The cardiopericardial silhouette is more normal in size today. The pulmonary vascularity is less engorged. The patient has undergone previous CABG. There is no pneumothorax. The trachea is midline. The observed portions of the bony thorax exhibit no acute abnormalities.  IMPRESSION: The findings suggest resolving CHF and interstitial edema. There is no alveolar pneumonia. Small bilateral pleural effusions layer posteriorly.   Electronically Signed   By: David  Martinique   On: 10/28/2013 07:47   Ct Head Wo Contrast  10/25/2013   CLINICAL DATA:  Altered mental status  EXAM: CT HEAD WITHOUT CONTRAST  TECHNIQUE: Contiguous axial images were obtained from the base of the skull through the vertex without intravenous contrast.  COMPARISON:  Prior CT from 05/26/2007  FINDINGS: There is no acute intracranial hemorrhage or infarct. No mass lesion or midline shift.  Gray-white matter differentiation is well maintained. Ventricles are normal in size without evidence of hydrocephalus. Cerebral volume within normal limits for patient age. Mild hypoattenuation within the periventricular white matter likely represents very mild chronic microvascular ischemic changes. No extra-axial fluid collection.  The calvarium is intact. Prominent calcifications subjacent to the inner table of the left frontal calvarium is unchanged.  Orbital soft tissues are within normal limits.  The paranasal sinuses and mastoid air cells are well pneumatized and free of fluid.  Scalp soft tissues are unremarkable.  IMPRESSION: No acute intracranial process.   Electronically Signed   By: Jeannine Boga M.D.   On: 10/25/2013 03:32   Nm Hepatobiliary Liver Func  11/17/2013   CLINICAL DATA Abdominal pain  EXAM NUCLEAR MEDICINE HEPATOBILIARY IMAGING  TECHNIQUE Sequential images of the abdomen were obtained out to 60 minutes following intravenous administration of radiopharmaceutical.  RADIOPHARMACEUTICALS 5.0  MCiTc-82m Choletec  COMPARISON NM HEPATO W/GB/PHARM/QUAN MEASUR dated 08/08/2011  FINDINGS There is immediate homogeneous uptake of radiotracer in the liver. Filling of the gallbladder begins at 35 minutes. Radiotracer uptake is present in the small bowel at 20 minutes.  At the end of 1-hour, there is relatively good clearance of activity from the liver.  IMPRESSION No biliary obstruction.  SIGNATURE  Electronically Signed   By: Kathreen Devoid   On: 11/17/2013 10:00   Nm Gastric Emptying  11/15/2013   CLINICAL DATA:  Nausea, vomiting, and abdominal pain.  EXAM: NUCLEAR MEDICINE GASTRIC EMPTYING SCAN  TECHNIQUE: After oral ingestion of radiolabeled meal, sequential abdominal images were obtained for 120 minutes. Residual percentage of activity remaining within the stomach was calculated at 60 and 120 minutes.  COMPARISON:  None.  RADIOPHARMACEUTICALS:  2.0 mCi ofTechnetium 99-m labeled sulfur  colloid  FINDINGS: Expected location of the stomach in the left upper quadrant. Ingested meal empties the stomach gradually over the course of the study with 5% retention at 60 min and 2% retention at 120  min (normal retention less than 30% at a 120 min).  IMPRESSION: Normal gastric emptying study.   Electronically Signed   By: Rozetta Nunnery M.D.   On: 11/15/2013 13:40   Dg Chest Port 1 View  10/26/2013   CLINICAL DATA:  Shortness of breath  EXAM: PORTABLE CHEST - 1 VIEW  COMPARISON:  10/25/2013  FINDINGS: Prior coronary bypass changes noted. Cardiomegaly evident with vascular congestion. Streaky perihilar and bibasilar atelectasis. No definite CHF or pneumonia. Negative for effusion or pneumothorax. Trachea midline.  IMPRESSION: Cardiomegaly with vascular congestion.  Bibasilar atelectasis  No significant interval change.   Electronically Signed   By: Daryll Brod M.D.   On: 10/26/2013 07:42   Dg Chest Portable 1 View  10/25/2013   CLINICAL DATA:  Hyperglycemic, altered mental status  EXAM: PORTABLE CHEST - 1 VIEW  COMPARISON:  DG CHEST 1V PORT dated 10/25/2013; DG CHEST 2 VIEW dated 05/23/2012; DG CHEST 1V PORT dated 11/14/2011  FINDINGS: Heart size upper normal to mildly enlarged but stable. Patient is status post CABG. Vascular pattern within normal limits. There is no consolidation effusion or pulmonary edema.  IMPRESSION: No acute findings.   Electronically Signed   By: Skipper Cliche M.D.   On: 10/25/2013 07:23   Dg Chest Port 1 View  10/25/2013   CLINICAL DATA:  Dyspnea, cough  EXAM: PORTABLE CHEST - 1 VIEW  COMPARISON:  DG CHEST 2 VIEW dated 05/23/2012  FINDINGS: Sternotomy wires overlie normal cardiac silhouette. No effusion, infiltrate, or pneumothorax. No osseous abnormality.  IMPRESSION: No acute cardiopulmonary process.   Electronically Signed   By: Suzy Bouchard M.D.   On: 10/25/2013 01:54   US Abdomen Limited Ruq  11/10/2013   CLINICAL DATA:  Right upper quadrant pain.  EXAM: US ABDOMEN  LIMITED - RIGHT UPPER QUADRANT  COMPARISON:  CT ABD/PELV WO CM dated 10/25/2013  FINDINGS: Gallbladder:  No gallstones or wall thickening visualized. No sonographic Murphy sign noted.  Common bile duct:  Diameter: 4 mm, normal.  Liver:  No focal lesion identified. Within normal limits in parenchymal echogenicity.  IMPRESSION: Negative right upper quadrant ultrasound.   Electronically Signed   By: Dereck Ligas M.D.   On: 11/10/2013 18:13    Microbiology: Recent Results (from the past 240 hour(s))  MRSA PCR SCREENING     Status: None   Collection Time    11/10/13  9:46 PM      Result Value Ref Range Status   MRSA by PCR NEGATIVE  NEGATIVE Final   Comment:            The GeneXpert MRSA Assay (FDA     approved for NASAL specimens     only), is one component of a     comprehensive MRSA colonization     surveillance program. It is not     intended to diagnose MRSA     infection nor to guide or     monitor treatment for     MRSA infections.  URINE CULTURE     Status: None   Collection Time    11/11/13  8:44 PM      Result Value Ref Range Status   Specimen Description URINE, CLEAN CATCH   Final   Special Requests NONE   Final   Culture  Setup Time     Final   Value: 11/12/2013 01:12     Performed at [facility]     Final   Value: >=  100,000 COLONIES/ML     Performed at [facility]   Culture     Final   Value: ESCHERICHIA COLI     Performed at [facility]   Report Status 11/13/2013 FINAL   Final   Organism ID, Bacteria ESCHERICHIA COLI   Final     Labs: Basic Metabolic Panel:  Recent Labs Lab 11/14/13 0405 11/15/13 0628 11/16/13 0520 11/17/13 0645 11/18/13 0445  NA 141 136* 138 136* 139  K 3.0* 3.7 3.4* 3.9 3.3*  CL 96 88* 87* 87* 90*  CO2 32 32 35* 34* 34*  GLUCOSE 31* 390* 227* 298* 86  BUN 7 15 17  26* 22  CREATININE 1.00 1.09 1.25* 1.38* 1.13*  CALCIUM 8.4 8.2* 8.8 8.7 8.8  MG  --   --   --  1.4*  --    Liver Function  Tests:  Recent Labs Lab 11/16/13 0520 11/17/13 0645  AST 10 11  ALT 7 6  ALKPHOS 217* 201*  BILITOT 0.3 0.4  PROT 6.5 6.2  ALBUMIN 2.9* 2.8*   No results found for this basename: LIPASE, AMYLASE,  in the last 168 hours No results found for this basename: AMMONIA,  in the last 168 hours CBC:  Recent Labs Lab 11/16/13 0520 11/18/13 0445  WBC 6.2 5.3  HGB 10.4* 9.9*  HCT 31.2* 29.8*  MCV 88.1 90.0  PLT 321 318   Cardiac Enzymes: No results found for this basename: CKTOTAL, CKMB, CKMBINDEX, TROPONINI,  in the last 168 hours BNP: BNP (last 3 results)  Recent Labs  10/28/13 0546 11/14/13 0405  PROBNP 11269.0* 1470.0*   CBG:  Recent Labs Lab 11/18/13 2119 11/19/13 0423 11/19/13 0457 11/19/13 0755 11/19/13 1155  GLUCAP 76 33* 97 157* 225*       Signed:  Jerrett Baldinger  [facility] 11/19/2013, 3:47 PM

## 2013-11-25 ENCOUNTER — Telehealth: Payer: Self-pay | Admitting: Family Medicine

## 2013-11-25 NOTE — Telephone Encounter (Signed)
appt given for thurs 3/26 with bill

## 2013-11-27 ENCOUNTER — Inpatient Hospital Stay (HOSPITAL_COMMUNITY)
Admission: EM | Admit: 2013-11-27 | Discharge: 2013-11-30 | DRG: 638 | Disposition: A | Discharge status: Home / Self Care | Payer: BC Managed Care – PPO | Attending: Internal Medicine | Admitting: Internal Medicine

## 2013-11-27 ENCOUNTER — Encounter (HOSPITAL_COMMUNITY): Payer: Self-pay | Admitting: Emergency Medicine

## 2013-11-27 ENCOUNTER — Emergency Department (HOSPITAL_COMMUNITY): Payer: BC Managed Care – PPO

## 2013-11-27 DIAGNOSIS — B373 Candidiasis of vulva and vagina: Secondary | ICD-10-CM

## 2013-11-27 DIAGNOSIS — I83009 Varicose veins of unspecified lower extremity with ulcer of unspecified site: Secondary | ICD-10-CM

## 2013-11-27 DIAGNOSIS — E861 Hypovolemia: Secondary | ICD-10-CM | POA: Diagnosis present

## 2013-11-27 DIAGNOSIS — E162 Hypoglycemia, unspecified: Secondary | ICD-10-CM

## 2013-11-27 DIAGNOSIS — E876 Hypokalemia: Secondary | ICD-10-CM

## 2013-11-27 DIAGNOSIS — R1031 Right lower quadrant pain: Secondary | ICD-10-CM

## 2013-11-27 DIAGNOSIS — Z8249 Family history of ischemic heart disease and other diseases of the circulatory system: Secondary | ICD-10-CM

## 2013-11-27 DIAGNOSIS — I959 Hypotension, unspecified: Secondary | ICD-10-CM

## 2013-11-27 DIAGNOSIS — I509 Heart failure, unspecified: Secondary | ICD-10-CM | POA: Diagnosis present

## 2013-11-27 DIAGNOSIS — E039 Hypothyroidism, unspecified: Secondary | ICD-10-CM | POA: Diagnosis present

## 2013-11-27 DIAGNOSIS — E1169 Type 2 diabetes mellitus with other specified complication: Principal | ICD-10-CM | POA: Diagnosis present

## 2013-11-27 DIAGNOSIS — F3289 Other specified depressive episodes: Secondary | ICD-10-CM | POA: Diagnosis present

## 2013-11-27 DIAGNOSIS — I219 Acute myocardial infarction, unspecified: Secondary | ICD-10-CM

## 2013-11-27 DIAGNOSIS — Z833 Family history of diabetes mellitus: Secondary | ICD-10-CM

## 2013-11-27 DIAGNOSIS — N179 Acute kidney failure, unspecified: Secondary | ICD-10-CM

## 2013-11-27 DIAGNOSIS — E785 Hyperlipidemia, unspecified: Secondary | ICD-10-CM

## 2013-11-27 DIAGNOSIS — Z7982 Long term (current) use of aspirin: Secondary | ICD-10-CM

## 2013-11-27 DIAGNOSIS — N183 Chronic kidney disease, stage 3 unspecified: Secondary | ICD-10-CM

## 2013-11-27 DIAGNOSIS — M129 Arthropathy, unspecified: Secondary | ICD-10-CM | POA: Diagnosis present

## 2013-11-27 DIAGNOSIS — L97909 Non-pressure chronic ulcer of unspecified part of unspecified lower leg with unspecified severity: Secondary | ICD-10-CM

## 2013-11-27 DIAGNOSIS — B3731 Acute candidiasis of vulva and vagina: Secondary | ICD-10-CM

## 2013-11-27 DIAGNOSIS — E1065 Type 1 diabetes mellitus with hyperglycemia: Secondary | ICD-10-CM

## 2013-11-27 DIAGNOSIS — I10 Essential (primary) hypertension: Secondary | ICD-10-CM

## 2013-11-27 DIAGNOSIS — K3184 Gastroparesis: Secondary | ICD-10-CM

## 2013-11-27 DIAGNOSIS — F32A Depression, unspecified: Secondary | ICD-10-CM

## 2013-11-27 DIAGNOSIS — I5022 Chronic systolic (congestive) heart failure: Secondary | ICD-10-CM

## 2013-11-27 DIAGNOSIS — I2510 Atherosclerotic heart disease of native coronary artery without angina pectoris: Secondary | ICD-10-CM

## 2013-11-27 DIAGNOSIS — N189 Chronic kidney disease, unspecified: Secondary | ICD-10-CM

## 2013-11-27 DIAGNOSIS — F411 Generalized anxiety disorder: Secondary | ICD-10-CM | POA: Diagnosis present

## 2013-11-27 DIAGNOSIS — E1049 Type 1 diabetes mellitus with other diabetic neurological complication: Secondary | ICD-10-CM

## 2013-11-27 DIAGNOSIS — IMO0002 Reserved for concepts with insufficient information to code with codable children: Secondary | ICD-10-CM

## 2013-11-27 DIAGNOSIS — I152 Hypertension secondary to endocrine disorders: Secondary | ICD-10-CM | POA: Diagnosis present

## 2013-11-27 DIAGNOSIS — Z794 Long term (current) use of insulin: Secondary | ICD-10-CM

## 2013-11-27 DIAGNOSIS — R011 Cardiac murmur, unspecified: Secondary | ICD-10-CM

## 2013-11-27 DIAGNOSIS — R112 Nausea with vomiting, unspecified: Secondary | ICD-10-CM

## 2013-11-27 DIAGNOSIS — I252 Old myocardial infarction: Secondary | ICD-10-CM

## 2013-11-27 DIAGNOSIS — E1159 Type 2 diabetes mellitus with other circulatory complications: Secondary | ICD-10-CM | POA: Diagnosis present

## 2013-11-27 DIAGNOSIS — E1143 Type 2 diabetes mellitus with diabetic autonomic (poly)neuropathy: Secondary | ICD-10-CM

## 2013-11-27 DIAGNOSIS — Z951 Presence of aortocoronary bypass graft: Secondary | ICD-10-CM

## 2013-11-27 DIAGNOSIS — N390 Urinary tract infection, site not specified: Secondary | ICD-10-CM

## 2013-11-27 DIAGNOSIS — K219 Gastro-esophageal reflux disease without esophagitis: Secondary | ICD-10-CM | POA: Diagnosis present

## 2013-11-27 DIAGNOSIS — E1110 Type 2 diabetes mellitus with ketoacidosis without coma: Secondary | ICD-10-CM

## 2013-11-27 DIAGNOSIS — I5023 Acute on chronic systolic (congestive) heart failure: Secondary | ICD-10-CM

## 2013-11-27 DIAGNOSIS — F329 Major depressive disorder, single episode, unspecified: Secondary | ICD-10-CM

## 2013-11-27 DIAGNOSIS — M810 Age-related osteoporosis without current pathological fracture: Secondary | ICD-10-CM | POA: Diagnosis present

## 2013-11-27 LAB — COMPREHENSIVE METABOLIC PANEL
ALT: 9 U/L (ref 0–35)
AST: 12 U/L (ref 0–37)
Albumin: 3.1 g/dL — ABNORMAL LOW (ref 3.5–5.2)
Alkaline Phosphatase: 162 U/L — ABNORMAL HIGH (ref 39–117)
BILIRUBIN TOTAL: 0.4 mg/dL (ref 0.3–1.2)
BUN: 11 mg/dL (ref 6–23)
CHLORIDE: 99 meq/L (ref 96–112)
CO2: 26 mEq/L (ref 19–32)
Calcium: 9.1 mg/dL (ref 8.4–10.5)
Creatinine, Ser: 0.92 mg/dL (ref 0.50–1.10)
GFR calc Af Amer: 75 mL/min — ABNORMAL LOW (ref >90)
GFR calc non Af Amer: 65 mL/min — ABNORMAL LOW (ref >90)
Glucose, Bld: 36 mg/dL — CL (ref 70–99)
Potassium: 3.5 mEq/L — ABNORMAL LOW (ref 3.7–5.3)
Sodium: 139 mEq/L (ref 137–147)
Total Protein: 6.5 g/dL (ref 6.0–8.3)

## 2013-11-27 LAB — CBC
HEMATOCRIT: 30.5 % — AB (ref 36.0–46.0)
HEMATOCRIT: 29.4 % — AB (ref 36.0–46.0)
HEMOGLOBIN: 10.2 g/dL — AB (ref 12.0–15.0)
HEMOGLOBIN: 9.8 g/dL — AB (ref 12.0–15.0)
MCH: 29.9 pg (ref 26.0–34.0)
MCH: 30.1 pg (ref 26.0–34.0)
MCHC: 33.4 g/dL (ref 30.0–36.0)
MCHC: 33.3 g/dL (ref 30.0–36.0)
MCV: 89.4 fL (ref 78.0–100.0)
MCV: 90.2 fL (ref 78.0–100.0)
Platelets: 295 10*3/uL (ref 150–400)
Platelets: 281 10*3/uL (ref 150–400)
RBC: 3.41 MIL/uL — ABNORMAL LOW (ref 3.87–5.11)
RBC: 3.26 MIL/uL — AB (ref 3.87–5.11)
RDW: 14.4 % (ref 11.5–15.5)
RDW: 14.3 % (ref 11.5–15.5)
WBC: 7.5 10*3/uL (ref 4.0–10.5)
WBC: 9.2 10*3/uL (ref 4.0–10.5)

## 2013-11-27 LAB — I-STAT CHEM 8, ED
BUN: 9 mg/dL (ref 6–23)
CREATININE: 1 mg/dL (ref 0.50–1.10)
Calcium, Ion: 1.14 mmol/L (ref 1.13–1.30)
Chloride: 100 mEq/L (ref 96–112)
GLUCOSE: 36 mg/dL — AB (ref 70–99)
HCT: 32 % — ABNORMAL LOW (ref 36.0–46.0)
HEMOGLOBIN: 10.9 g/dL — AB (ref 12.0–15.0)
POTASSIUM: 3.3 meq/L — AB (ref 3.7–5.3)
Sodium: 137 mEq/L (ref 137–147)
TCO2: 26 mmol/L (ref 0–100)

## 2013-11-27 LAB — GLUCOSE, CAPILLARY
Glucose-Capillary: 422 mg/dL — ABNORMAL HIGH (ref 70–99)
Glucose-Capillary: 161 mg/dL — ABNORMAL HIGH (ref 70–99)
Glucose-Capillary: 306 mg/dL — ABNORMAL HIGH (ref 70–99)

## 2013-11-27 LAB — TROPONIN I: Troponin I: <0.3 ng/mL (ref <0.30)

## 2013-11-27 LAB — CREATININE, SERUM
CREATININE: 0.98 mg/dL (ref 0.50–1.10)
GFR calc Af Amer: 70 mL/min — ABNORMAL LOW (ref >90)
GFR calc non Af Amer: 60 mL/min — ABNORMAL LOW (ref >90)

## 2013-11-27 LAB — CBG MONITORING, ED
GLUCOSE-CAPILLARY: 163 mg/dL — AB (ref 70–99)
Glucose-Capillary: 28 mg/dL — CL (ref 70–99)
Glucose-Capillary: 274 mg/dL — ABNORMAL HIGH (ref 70–99)

## 2013-11-27 LAB — I-STAT CG4 LACTIC ACID, ED: LACTIC ACID, VENOUS: 1.31 mmol/L (ref 0.5–2.2)

## 2013-11-27 MED ORDER — PANTOPRAZOLE SODIUM 40 MG PO TBEC
40.0000 mg | DELAYED_RELEASE_TABLET | Freq: Every day | ORAL | Status: DC
  Administered 2013-11-27 – 2013-11-30 (×4): 40 mg via ORAL
  Filled 2013-11-27 (×4): qty 1

## 2013-11-27 MED ORDER — VITAMIN D (ERGOCALCIFEROL) 1.25 MG (50000 UNIT) PO CAPS
50000.0000 [IU] | ORAL_CAPSULE | ORAL | Status: DC
  Administered 2013-11-27: 50000 [IU] via ORAL
  Filled 2013-11-27: qty 1

## 2013-11-27 MED ORDER — ACETAMINOPHEN 325 MG PO TABS: 650.0000 mg | ORAL_TABLET | Freq: Four times a day (QID) | ORAL | Status: DC | PRN

## 2013-11-27 MED ORDER — ESCITALOPRAM OXALATE 10 MG PO TABS
10.0000 mg | ORAL_TABLET | Freq: Every day | ORAL | Status: DC
  Administered 2013-11-27 – 2013-11-30 (×4): 10 mg via ORAL
  Filled 2013-11-27 (×4): qty 1

## 2013-11-27 MED ORDER — FUROSEMIDE 40 MG PO TABS
40.0000 mg | ORAL_TABLET | Freq: Every day | ORAL | Status: DC
  Administered 2013-11-27: 40 mg via ORAL
  Filled 2013-11-27 (×3): qty 1

## 2013-11-27 MED ORDER — SODIUM CHLORIDE 0.9 % IJ SOLN
3.0000 mL | Freq: Two times a day (BID) | INTRAMUSCULAR | Status: DC
  Administered 2013-11-27 – 2013-11-29 (×6): 3 mL via INTRAVENOUS

## 2013-11-27 MED ORDER — ATORVASTATIN CALCIUM 80 MG PO TABS
80.0000 mg | ORAL_TABLET | Freq: Every day | ORAL | Status: DC
  Administered 2013-11-27 – 2013-11-29 (×3): 80 mg via ORAL
  Filled 2013-11-27 (×4): qty 1

## 2013-11-27 MED ORDER — LEVOTHYROXINE SODIUM 125 MCG PO TABS
125.0000 ug | ORAL_TABLET | Freq: Every day | ORAL | Status: DC
  Administered 2013-11-27 – 2013-11-30 (×4): 125 ug via ORAL
  Filled 2013-11-27 (×5): qty 1

## 2013-11-27 MED ORDER — POTASSIUM CHLORIDE CRYS ER 20 MEQ PO TBCR
40.0000 meq | EXTENDED_RELEASE_TABLET | Freq: Once | ORAL | Status: AC
  Administered 2013-11-27: 40 meq via ORAL
  Filled 2013-11-27: qty 2

## 2013-11-27 MED ORDER — SODIUM CHLORIDE 0.9 % IV BOLUS (SEPSIS)
1000.0000 mL | Freq: Once | INTRAVENOUS | Status: AC
  Administered 2013-11-27: 1000 mL via INTRAVENOUS

## 2013-11-27 MED ORDER — INSULIN ASPART 100 UNIT/ML ~~LOC~~ SOLN
0.0000 [IU] | Freq: Every day | SUBCUTANEOUS | Status: DC
  Administered 2013-11-27 – 2013-11-28 (×2): 4 [IU] via SUBCUTANEOUS

## 2013-11-27 MED ORDER — ASPIRIN EC 325 MG PO TBEC
325.0000 mg | DELAYED_RELEASE_TABLET | Freq: Every day | ORAL | Status: DC
  Administered 2013-11-27 – 2013-11-30 (×4): 325 mg via ORAL
  Filled 2013-11-27 (×4): qty 1

## 2013-11-27 MED ORDER — SPIRONOLACTONE 12.5 MG HALF TABLET
12.5000 mg | ORAL_TABLET | Freq: Every day | ORAL | Status: DC
  Filled 2013-11-27: qty 1

## 2013-11-27 MED ORDER — ENOXAPARIN SODIUM 40 MG/0.4ML ~~LOC~~ SOLN
40.0000 mg | Freq: Every day | SUBCUTANEOUS | Status: DC
  Administered 2013-11-27 – 2013-11-30 (×4): 40 mg via SUBCUTANEOUS
  Filled 2013-11-27 (×4): qty 0.4

## 2013-11-27 MED ORDER — ACETAMINOPHEN 650 MG RE SUPP
650.0000 mg | Freq: Four times a day (QID) | RECTAL | Status: DC | PRN
Start: 2013-11-27 — End: 2013-11-30

## 2013-11-27 MED ORDER — CLOPIDOGREL BISULFATE 75 MG PO TABS
75.0000 mg | ORAL_TABLET | Freq: Every day | ORAL | Status: DC
  Administered 2013-11-27 – 2013-11-30 (×4): 75 mg via ORAL
  Filled 2013-11-27 (×5): qty 1

## 2013-11-27 MED ORDER — SODIUM CHLORIDE 0.9 % IV SOLN
INTRAVENOUS | Status: AC
  Administered 2013-11-27: 1000 mL via INTRAVENOUS

## 2013-11-27 MED ORDER — ONDANSETRON HCL 4 MG/2ML IJ SOLN
4.0000 mg | Freq: Once | INTRAMUSCULAR | Status: AC
  Administered 2013-11-27: 4 mg via INTRAVENOUS
  Filled 2013-11-27: qty 2

## 2013-11-27 MED ORDER — DEXTROSE 50 % IV SOLN
50.0000 mL | Freq: Once | INTRAVENOUS | Status: AC
  Administered 2013-11-27: 50 mL via INTRAVENOUS
  Filled 2013-11-27: qty 50

## 2013-11-27 MED ORDER — ONDANSETRON HCL 4 MG PO TABS
4.0000 mg | ORAL_TABLET | Freq: Four times a day (QID) | ORAL | Status: DC | PRN
  Administered 2013-11-27 – 2013-11-28 (×2): 4 mg via ORAL
  Filled 2013-11-27 (×2): qty 1

## 2013-11-27 MED ORDER — SODIUM CHLORIDE 0.9 % IV BOLUS (SEPSIS)
250.0000 mL | Freq: Once | INTRAVENOUS | Status: AC
  Administered 2013-11-27: 250 mL via INTRAVENOUS

## 2013-11-27 MED ORDER — NYSTATIN 100000 UNIT/GM EX CREA
TOPICAL_CREAM | Freq: Two times a day (BID) | CUTANEOUS | Status: DC
  Administered 2013-11-27 (×2): via TOPICAL
  Administered 2013-11-28: 1 via TOPICAL
  Administered 2013-11-28 – 2013-11-29 (×3): via TOPICAL
  Filled 2013-11-27: qty 15

## 2013-11-27 MED ORDER — ONDANSETRON HCL 4 MG/2ML IJ SOLN: 4.0000 mg | Freq: Four times a day (QID) | INTRAMUSCULAR | Status: DC | PRN

## 2013-11-27 MED ORDER — POTASSIUM CHLORIDE CRYS ER 20 MEQ PO TBCR
40.0000 meq | EXTENDED_RELEASE_TABLET | ORAL | Status: AC
  Administered 2013-11-27 (×2): 40 meq via ORAL
  Filled 2013-11-27 (×2): qty 2

## 2013-11-27 MED ORDER — HYOSCYAMINE SULFATE 0.125 MG SL SUBL
0.1250 mg | SUBLINGUAL_TABLET | Freq: Four times a day (QID) | SUBLINGUAL | Status: DC | PRN
  Administered 2013-11-27: 0.125 mg via SUBLINGUAL
  Filled 2013-11-27: qty 1

## 2013-11-27 MED ORDER — INSULIN ASPART 100 UNIT/ML ~~LOC~~ SOLN
0.0000 [IU] | Freq: Three times a day (TID) | SUBCUTANEOUS | Status: DC
  Administered 2013-11-27: 15 [IU] via SUBCUTANEOUS
  Administered 2013-11-27: 3 [IU] via SUBCUTANEOUS
  Administered 2013-11-28 – 2013-11-29 (×2): 5 [IU] via SUBCUTANEOUS
  Administered 2013-11-29: 15 [IU] via SUBCUTANEOUS
  Administered 2013-11-29: 2 [IU] via SUBCUTANEOUS

## 2013-11-27 MED ORDER — METOCLOPRAMIDE HCL 10 MG PO TABS
10.0000 mg | ORAL_TABLET | Freq: Three times a day (TID) | ORAL | Status: DC
  Administered 2013-11-27 – 2013-11-30 (×12): 10 mg via ORAL
  Filled 2013-11-27 (×15): qty 1

## 2013-11-27 NOTE — ED Notes (Signed)
Attempted to call report, nurse unavailable.

## 2013-11-27 NOTE — ED Notes (Signed)
Admitting MD spoke with pt on phone. Pt agreeable to stay.

## 2013-11-27 NOTE — ED Notes (Signed)
Copy of Lactic acid 1.31 mmol/l was given to MD

## 2013-11-27 NOTE — H&P (Addendum)
[facility] History and Physical  Bridget Martinez [id number] DOB: Dec 29, 1950 DOA: 11/27/2013  Referring physician: EDP PCP: [name], MD   Chief Complaint: Altered mental status  HPI: Bridget Martinez is a 63 y.o. female history of diabetes mellitus, hypertension, CAD, CHF and other medical problems as listed below recently discharged from the hospital on 3/13 following hospitalization for DKA who presents with above complaints. She states that last night she took her at bedtime dose Levemir as prescribed, at about the 3:30 AM her husband reports that she was making 'some noises', and she appeared diaphoretic and poorly responsive so he called EMS. Per EDP patient initially had a blood glucose in the 30s, and it came up after D50. In the ED blood glucose of 28 was documented and she was treated with D50 and blood glucose improved to 274 on recheck up. The patient's mentation improved, and she was able to give a history at the time of my visit. She states that she has been on the same dose of Levemir since her discharge on the 13th,  and was still to see her PCP as scheduled. She denies any nausea vomiting, chest pain, diarrhea and states she's had a good by mouth intake and it last p.m. In the ED her blood pressures initially was stable but she became hypotensive to the 70s and was bolused with IV fluids. She denies any shortness of breath, and no leg swelling. [facility] was asked to admit patient for further evaluation and management. Patient denies cough fevers dysuria melena no hematochezia.  Review of Systems The patient denies anorexia, fever, weight loss,, vision loss, decreased hearing, hoarseness, chest pain, syncope, dyspnea on exertion, peripheral edema, balance deficits, hemoptysis, abdominal pain, melena, hematochezia, severe indigestion/heartburn, hematuria, incontinence, genital sores, muscle weakness, suspicious skin lesions, transient blindness, difficulty walking, depression,  unusual weight change, abnormal bleeding   Past Medical History  Diagnosis Date  . Diabetes mellitus     on insulin, with h/o DKA   . HTN (hypertension)   . Hyperlipidemia   . Venous stasis ulcers   . Arthritis   . Depression     anxiety  . 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 Hypertension   . Coronary artery disease     angina.  MI.   . Pneumonia        . GERD (gastroesophageal reflux disease)     Hiatal hernia.   . Hypothyroidism   . CHF (congestive heart failure)   . Tuberculosis   . Osteoporosis    Past Surgical History  Procedure Laterality Date  . Cardiac catheterization    . Tonsillectomy    . Coronary artery bypass graft      Dr. Prescott Gum in 09/2011   . Coronary artery bypass graft  2012  . Esophagogastroduodenoscopy N/A 11/17/2013    Procedure: ESOPHAGOGASTRODUODENOSCOPY (EGD);  Surgeon: Lafayette Dragon, MD;  Location: [facility] ENDOSCOPY;  Service: Endoscopy;  Laterality: N/A;  . Colonoscopy N/A 11/18/2013    Procedure: COLONOSCOPY;  Surgeon: Lafayette Dragon, MD;  Location: [facility] ENDOSCOPY;  Service: Endoscopy;  Laterality: N/A;   Social History:  reports that she has never smoked. She does not have any smokeless tobacco history on file. She reports that she does not drink alcohol or use illicit drugs.  Allergies  Allergen Reactions  . Sulfa Antibiotics Anaphylaxis and Swelling    Stiff neck also     Family History  Problem  Relation Age of Onset  . Heart disease Father   . Multiple sclerosis Father   . Hypertension Mother   . Hyperthyroidism Mother   . Diabetes Cousin     Multiple maternal cousins with type 2 diabetes mellitus  . Diabetes Maternal Uncle     Type 1 diabetes mellitus  . Heart attack Paternal Grandfather   . Heart disease Paternal Grandfather      Prior to Admission medications   Medication Sig Start Date End Date Taking? Authorizing Provider  aspirin EC 325 MG tablet Take 325 mg by mouth daily.    Yes Historical Provider, MD  carvedilol (COREG) 6.25 MG tablet Take 1 tablet (6.25 mg total) by mouth 2 (two) times daily with a meal. 10/29/13  Yes Bobby Rumpf York, PA-C  clopidogrel (PLAVIX) 75 MG tablet Take 75 mg by mouth daily with breakfast.   Yes Historical Provider, MD  escitalopram (LEXAPRO) 10 MG tablet Take 1 tablet (10 mg total) by mouth daily. 09/27/13  Yes Lysbeth Penner, FNP  furosemide (LASIX) 40 MG tablet Take 1 tablet (40 mg total) by mouth daily. 10/29/13  Yes Bobby Rumpf York, PA-C  hyoscyamine (LEVSIN SL) 0.125 MG SL tablet Place 1 tablet (0.125 mg total) under the tongue every 6 (six) hours as needed for cramping. 11/19/13  Yes Kelvin Cellar, MD  insulin aspart (NOVOLOG) 100 UNIT/ML injection Inject 100 Units into the skin 3 (three) times daily with meals. 14 units at breakfast, 12 units at lunch, 7 units supper Plus 1 unit for every 35mg /dL above 100 mg/dL   Yes Historical Provider, MD  insulin detemir (LEVEMIR) 100 UNIT/ML injection Inject 0.15 mLs (15 Units total) into the skin at bedtime. 11/19/13  Yes Kelvin Cellar, MD  levothyroxine (SYNTHROID, LEVOTHROID) 125 MCG tablet Take 1 tablet (125 mcg total) by mouth daily. 09/27/13  Yes Lysbeth Penner, FNP  metoCLOPramide (REGLAN) 10 MG tablet Take 1 tablet (10 mg total) by mouth 4 (four) times daily -  before meals and at bedtime. 11/19/13  Yes Kelvin Cellar, MD  nystatin cream (MYCOSTATIN) Apply topically 2 (two) times daily. 10/29/13  Yes Marianne L York, PA-C  ondansetron (ZOFRAN ODT) 8 MG disintegrating tablet Take 1 tablet (8 mg total) by mouth every 8 (eight) hours as needed for nausea or vomiting. 11/10/13  Yes Lysbeth Penner, FNP  pantoprazole (PROTONIX) 40 MG tablet Take 40 mg by mouth daily. 11/26/11 12/31/13 Yes Shaune Pascal [name], MD  rosuvastatin (CRESTOR) 40 MG tablet Take 1 tablet (40 mg total) by mouth daily. 09/27/13  Yes Lysbeth Penner, FNP  spironolactone (ALDACTONE) 25 MG tablet Take 12.5 mg by mouth daily.    Yes Historical Provider, MD  fluconazole (DIFLUCAN) 150 MG tablet Take 1 tablet (150 mg total) by mouth once a week. 10/29/13   Melton Alar, PA-C  Vitamin D, Ergocalciferol, (DRISDOL) 50000 UNITS CAPS capsule Take 1 capsule by mouth once a week. On saturday 09/29/13   Historical Provider, MD   Physical Exam: Filed Vitals:   11/27/13 0830  BP:   Pulse: 66  Temp:   Resp: 17    BP 113/46  Pulse 66  Temp(Src) 97.7 F (36.5 C) (Oral)  Resp 17  Ht 5\' 3"  (1.6 m)  Wt 68.04 kg (150 lb)  BMI 26.58 kg/m2  SpO2 99% Constitutional: Vital signs reviewed.  Patient is a well-developed and well-nourished  in no acute distress and cooperative with exam. Alert and oriented x3.  Head: Normocephalic and atraumatic  Ear: TM normal bilaterally Nose: No erythema or drainage noted.  Turbinates normal Mouth: no erythema or exudates, MMM Eyes: PERRL, EOMI, conjunctivae normal, No scleral icterus.  Neck: Supple, Trachea midline normal ROM, No JVD, mass, thyromegaly, or carotid bruit present.  Cardiovascular: RRR, S1 normal, S2 normal, no MRG, pulses symmetric and intact bilaterally Pulmonary/Chest: normal respiratory effort, CTAB, no wheezes, rales, or rhonchi Abdominal: Soft. Non-tender, non-distended, bowel sounds are normal, no masses, organomegaly, or guarding present.  GU: no CVA tenderness  extremities: No cyanosis and no edema  Neurological: A&O x3, Strength is normal and symmetric bilaterally, cranial nerve II-XII are grossly intact, no focal motor deficit, sensory intact to light touch bilaterally.  Skin: Warm, dry and intact. No rash, cyanosis, or clubbing.  Psychiatric: Normal mood and affect. speech and behavior is normal.               Labs on Admission:  Basic Metabolic Panel:  Recent Labs Lab 11/27/13 0530 11/27/13 0537  NA 139 137  K 3.5* 3.3*  CL 99 100  CO2 26  --   GLUCOSE 36* 36*  BUN 11 9  CREATININE 0.92 1.00  CALCIUM 9.1  --    Liver Function Tests:  Recent  Labs Lab 11/27/13 0530  AST 12  ALT 9  ALKPHOS 162*  BILITOT 0.4  PROT 6.5  ALBUMIN 3.1*   No results found for this basename: LIPASE, AMYLASE,  in the last 168 hours No results found for this basename: AMMONIA,  in the last 168 hours CBC:  Recent Labs Lab 11/27/13 0530 11/27/13 0537  WBC 7.5  --   HGB 10.2* 10.9*  HCT 30.5* 32.0*  MCV 89.4  --   PLT 295  --    Cardiac Enzymes:  Recent Labs Lab 11/27/13 0530  TROPONINI <0.30    BNP (last 3 results)  Recent Labs  10/28/13 0546 11/14/13 0405  PROBNP 11269.0* 1470.0*   CBG:  Recent Labs Lab 11/27/13 0516 11/27/13 0557 11/27/13 0740  GLUCAP 28* 163* 274*    Radiological Exams on Admission: No results found.   Assessment/Plan Active Problems:  Hypoglycemia -Unclear etiology, we'll hold Levemir for now, monitor Accu-Cheks follow hypoglycemia protocol and cover with sliding scale insulin as appropriate  -Her renal function is within normal limits -Check UA to evaluate for any underlying infection. Hypotension -Possibly secondary to hypovolemia -Will obtain UA and blood cultures to evaluate for possible underlying infection -Cycle cardiac enzymes follow -BPs improved with cautious IV fluids -Hold off antihypertensives for now.  H/o Hypertension -As above hold off antihypertensives for now follow resume when clinically appropriate   Hypokalemia -Replace potassium  Chronic systolic congestive heart failure, NYHA class 3 -Monitor strict I.'s and nose with cautious hydration as ready discussed -Hold Lasix for today, follow and resume in a.m. as appropriate. -Compensated History of CAD  -She is chest pain free -Continue aspirin and Plavix -Follow resume Coreg when hypotension resolves.     Code Status: FULL Family Communication: None at bedside Disposition Plan: Admit to telemetry for observation  Time spent: >30  [facility] Pager [phone]

## 2013-11-27 NOTE — ED Notes (Signed)
Dr. Marnette Burgess at the bedside to visit patient.  Discussed blood pressure trending down, and patient complains of nausea.  Will administer 1L of NS bolus and 4mg  of zofran per MD verbal order.

## 2013-11-27 NOTE — ED Notes (Signed)
Checked Pt CBG level is 28 mg/dL nurse notified

## 2013-11-27 NOTE — ED Notes (Signed)
Meal given to patient 

## 2013-11-27 NOTE — ED Notes (Signed)
Spoke with both Dr. Thurnell Garbe and Admitting MD about pt requesting to leave. Pt informed of risks of leaving AMA. Pt understanding, will sign out AMA.

## 2013-11-27 NOTE — ED Notes (Signed)
Spoke with admitting MD about pt requesting to leave. Admitting MD states she spoke with patient.

## 2013-11-27 NOTE — ED Notes (Signed)
EKG given to Dr. Montez Morita.

## 2013-11-27 NOTE — ED Notes (Signed)
Lab called to report critical lab result of 36.  Already treated with capillary glucose reading of 163 at 0601.

## 2013-11-27 NOTE — ED Notes (Signed)
Dr Marnette Burgess given a copy of chem 8 results

## 2013-11-27 NOTE — ED Provider Notes (Signed)
CSN: [id number]     Arrival date & time 11/27/13  0444 History   First MD Initiated Contact with Patient 11/27/13 [phone]     Chief Complaint  Patient presents with  . Fatigue     (Consider location/radiation/quality/duration/timing/severity/associated sxs/prior Treatment) HPI History provided by patient and her husband. Has poorly controlled diabetes with recent medication changes. Last night around 9 PM took her insulin Levemir injection as prescribed. Her husband woke up to hurt making noises, noticed she was diaphoretic and poorly responsive and he called EMS. Patient awake and talking with EMS. CBG in route reported to be 244. Patient complains of generalized weakness and fatigue. She denies any chest pain, difficulty breathing. She has mild nausea but no vomiting. She has history of low blood sugar and similar symptoms in the past. In the ER her blood sugar was found to be low. No recent fevers, chills, cold or illness. No medication changes otherwise and no new medicines. Symptoms moderate in severity.  Past Medical History  Diagnosis Date  . Diabetes mellitus     on insulin, with h/o DKA   . HTN (hypertension)   . Hyperlipidemia   . Venous stasis ulcers   . Arthritis   . Depression     anxiety  . 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 Hypertension   . Coronary artery disease     angina.  MI.   . Pneumonia        . GERD (gastroesophageal reflux disease)     Hiatal hernia.   . Hypothyroidism   . CHF (congestive heart failure)   . Tuberculosis   . Osteoporosis    Past Surgical History  Procedure Laterality Date  . Cardiac catheterization    . Tonsillectomy    . Coronary artery bypass graft      Dr. Prescott Gum in 09/2011   . Coronary artery bypass graft  2012  . Esophagogastroduodenoscopy N/A 11/17/2013    Procedure: ESOPHAGOGASTRODUODENOSCOPY (EGD);  Surgeon: Lafayette Dragon, MD;  Location: [facility] ENDOSCOPY;  Service: Endoscopy;   Laterality: N/A;  . Colonoscopy N/A 11/18/2013    Procedure: COLONOSCOPY;  Surgeon: Lafayette Dragon, MD;  Location: [facility] ENDOSCOPY;  Service: Endoscopy;  Laterality: N/A;   Family History  Problem Relation Age of Onset  . Heart disease Father   . Multiple sclerosis Father   . Hypertension Mother   . Hyperthyroidism Mother   . Diabetes Cousin     Multiple maternal cousins with type 2 diabetes mellitus  . Diabetes Maternal Uncle     Type 1 diabetes mellitus  . Heart attack Paternal Grandfather   . Heart disease Paternal Grandfather    History  Substance Use Topics  . Smoking status: Never Smoker   . Smokeless tobacco: Not on file  . Alcohol Use: No   OB History   Grav Para Term Preterm Abortions TAB SAB Ect Mult Living                 Review of Systems  Constitutional: Negative for fever and chills.  Respiratory: Negative for shortness of breath.   Cardiovascular: Negative for chest pain.  Gastrointestinal: Negative for abdominal pain.  Genitourinary: Negative for dysuria.  Musculoskeletal: Negative for back pain, neck pain and neck stiffness.  Skin: Negative for rash.  Neurological: Negative for headaches.  All other systems reviewed and are negative.      Allergies  Sulfa antibiotics  Home Medications  Current Outpatient Rx  Name  Route  Sig  Dispense  Refill  . aspirin EC 325 MG tablet   Oral   Take 325 mg by mouth daily.         . carvedilol (COREG) 6.25 MG tablet   Oral   Take 1 tablet (6.25 mg total) by mouth 2 (two) times daily with a meal.   60 tablet   2   . clopidogrel (PLAVIX) 75 MG tablet   Oral   Take 75 mg by mouth daily with breakfast.         . escitalopram (LEXAPRO) 10 MG tablet   Oral   Take 1 tablet (10 mg total) by mouth daily.   30 tablet   11   . furosemide (LASIX) 40 MG tablet   Oral   Take 1 tablet (40 mg total) by mouth daily.   30 tablet   3   . hyoscyamine (LEVSIN SL) 0.125 MG SL tablet   Sublingual   Place 1  tablet (0.125 mg total) under the tongue every 6 (six) hours as needed for cramping.   30 tablet   0   . insulin aspart (NOVOLOG) 100 UNIT/ML injection   Subcutaneous   Inject 100 Units into the skin 3 (three) times daily with meals. 14 units at breakfast, 12 units at lunch, 7 units supper Plus 1 unit for every 35mg /dL above 100 mg/dL         . insulin detemir (LEVEMIR) 100 UNIT/ML injection   Subcutaneous   Inject 0.15 mLs (15 Units total) into the skin at bedtime.   10 mL   11   . levothyroxine (SYNTHROID, LEVOTHROID) 125 MCG tablet   Oral   Take 1 tablet (125 mcg total) by mouth daily.   30 tablet   11     NTBS   . metoCLOPramide (REGLAN) 10 MG tablet   Oral   Take 1 tablet (10 mg total) by mouth 4 (four) times daily -  before meals and at bedtime.   60 tablet   1   . nystatin cream (MYCOSTATIN)   Topical   Apply topically 2 (two) times daily.   30 g   0   . ondansetron (ZOFRAN ODT) 8 MG disintegrating tablet   Oral   Take 1 tablet (8 mg total) by mouth every 8 (eight) hours as needed for nausea or vomiting.   20 tablet   1   . pantoprazole (PROTONIX) 40 MG tablet   Oral   Take 40 mg by mouth daily.         . rosuvastatin (CRESTOR) 40 MG tablet   Oral   Take 1 tablet (40 mg total) by mouth daily.   30 tablet   11   . spironolactone (ALDACTONE) 25 MG tablet   Oral   Take 12.5 mg by mouth daily.         . fluconazole (DIFLUCAN) 150 MG tablet   Oral   Take 1 tablet (150 mg total) by mouth once a week.   24 tablet   0   . Vitamin D, Ergocalciferol, (DRISDOL) 50000 UNITS CAPS capsule   Oral   Take 1 capsule by mouth once a week. On saturday          BP 101/30  Pulse 68  Resp 15  Ht 5\' 3"  (1.6 m)  Wt 150 lb (68.04 kg)  BMI 26.58 kg/m2  SpO2 100% Physical Exam  Constitutional: She is oriented to person,  place, and time. She appears well-developed and well-nourished.  HENT:  Head: Normocephalic and atraumatic.  Mouth/Throat: Oropharynx is  clear and moist.  Eyes: EOM are normal. Pupils are equal, round, and reactive to light. No scleral icterus.  Neck: Neck supple.  Cardiovascular: Normal rate, regular rhythm and intact distal pulses.   Pulmonary/Chest: Effort normal and breath sounds normal. No respiratory distress. She exhibits no tenderness.  Abdominal: Soft. She exhibits no distension. There is no tenderness.  Musculoskeletal: Normal range of motion. She exhibits no edema and no tenderness.  Neurological: She is alert and oriented to person, place, and time. She displays normal reflexes. No cranial nerve deficit. Coordination normal.  Speech clear, no pronator drift, equal upper and lower extremity strengths.   Skin: Skin is warm and dry.    ED Course  Procedures (including critical care time) Labs Review Labs Reviewed  CBC - Abnormal; Notable for the following:    RBC 3.41 (*)    Hemoglobin 10.2 (*)    HCT 30.5 (*)    All other components within normal limits  COMPREHENSIVE METABOLIC PANEL - Abnormal; Notable for the following:    Potassium 3.5 (*)    Glucose, Bld 36 (*)    Albumin 3.1 (*)    Alkaline Phosphatase 162 (*)    GFR calc non Af Amer 65 (*)    GFR calc Af Amer 75 (*)    All other components within normal limits  CBG MONITORING, ED - Abnormal; Notable for the following:    Glucose-Capillary 28 (*)    All other components within normal limits  I-STAT CHEM 8, ED - Abnormal; Notable for the following:    Potassium 3.3 (*)    Glucose, Bld 36 (*)    Hemoglobin 10.9 (*)    HCT 32.0 (*)    All other components within normal limits  CBG MONITORING, ED - Abnormal; Notable for the following:    Glucose-Capillary 163 (*)    All other components within normal limits  TROPONIN I    EKG Interpretation   Date/Time:  Saturday November 27 2013 05:08:49 EDT Ventricular Rate:  58 PR Interval:  182 QRS Duration: 105 QT Interval:  532 QTC Calculation: 523 R Axis:   33 Text Interpretation:  Sinus rhythm  Ventricular premature complex  Nonspecific ST and T wave abnormality Prolonged QT interval Confirmed by  Armistead Sult  MD, Belinda Schlichting ([id number]) on 11/27/2013 6:32:18 AM     Meal provided. D50 provided. Repeat blood sugar in normal range  Initial blood pressure 124/48 on repeat in the 90s. Patient given IV fluids. Blood pressure not improving. Medicine consulted for admit  MDM   Diagnosis: Hypoglycemia, hypotension  EKG, labs and reviewed Hypoglycemia corrected Patient initially normotensive on arrival to the ER, developed hypotension and given IV fluids. Noted history CHF.  MED admit  Teressa Lower, MD 11/28/13 [phone]

## 2013-11-27 NOTE — ED Notes (Signed)
NS 0.9% infusing into right AC without problems. Alert and oriented at this time. Family member at the bedside.

## 2013-11-27 NOTE — Progress Notes (Signed)
Patient trasfered from ED to [phone] via bed; alert and oriented x 4; no complaints of pain; IV saline locked in RFA; skin intact, dry with rash on groin area; dehisced toe right nail - dressing on.Orient patient to room and unit; watch safety video; gave patient care guide; instructed how to use the call bell and  fall risk precautions. Will continue to monitor the patient.

## 2013-11-27 NOTE — Progress Notes (Signed)
Utilization Review Completed.Emile Ringgenberg T3/21/2015  

## 2013-11-27 NOTE — ED Notes (Signed)
Per EMS, patient is from home and called for a sudden "feeling of hotness" and generalized weakness.  She was recently seen for gastroparesis. She is known diabetic.  CBG enroute 244, VS stable with BP of 136/72 heart rate 70, RR 18, and O2 sat 99% on room air.  Patient is alert and oriented.

## 2013-11-28 DIAGNOSIS — I5023 Acute on chronic systolic (congestive) heart failure: Secondary | ICD-10-CM

## 2013-11-28 DIAGNOSIS — R1031 Right lower quadrant pain: Secondary | ICD-10-CM

## 2013-11-28 DIAGNOSIS — N183 Chronic kidney disease, stage 3 unspecified: Secondary | ICD-10-CM

## 2013-11-28 LAB — CBC
HCT: 29.9 % — ABNORMAL LOW (ref 36.0–46.0)
Hemoglobin: 9.9 g/dL — ABNORMAL LOW (ref 12.0–15.0)
MCH: 30.2 pg (ref 26.0–34.0)
MCHC: 33.1 g/dL (ref 30.0–36.0)
MCV: 91.2 fL (ref 78.0–100.0)
PLATELETS: 277 10*3/uL (ref 150–400)
RBC: 3.28 MIL/uL — AB (ref 3.87–5.11)
RDW: 14.8 % (ref 11.5–15.5)
WBC: 6.9 10*3/uL (ref 4.0–10.5)

## 2013-11-28 LAB — BASIC METABOLIC PANEL
BUN: 12 mg/dL (ref 6–23)
BUN: 11 mg/dL (ref 6–23)
CO2: 15 meq/L — AB (ref 19–32)
CO2: 20 meq/L (ref 19–32)
CREATININE: 0.89 mg/dL (ref 0.50–1.10)
Calcium: 8.5 mg/dL (ref 8.4–10.5)
Calcium: 9 mg/dL (ref 8.4–10.5)
Chloride: 92 mEq/L — ABNORMAL LOW (ref 96–112)
Chloride: 97 mEq/L (ref 96–112)
Creatinine, Ser: 0.9 mg/dL (ref 0.50–1.10)
GFR calc Af Amer: 78 mL/min — ABNORMAL LOW (ref >90)
GFR calc Af Amer: 77 mL/min — ABNORMAL LOW (ref >90)
GFR calc non Af Amer: 68 mL/min — ABNORMAL LOW (ref >90)
GFR calc non Af Amer: 67 mL/min — ABNORMAL LOW (ref >90)
GLUCOSE: 630 mg/dL — AB (ref 70–99)
GLUCOSE: 120 mg/dL — AB (ref 70–99)
Potassium: 6.4 mEq/L — ABNORMAL HIGH (ref 3.7–5.3)
Potassium: 4.8 mEq/L (ref 3.7–5.3)
Sodium: 129 mEq/L — ABNORMAL LOW (ref 137–147)
Sodium: 134 mEq/L — ABNORMAL LOW (ref 137–147)

## 2013-11-28 LAB — GLUCOSE, CAPILLARY
GLUCOSE-CAPILLARY: 118 mg/dL — AB (ref 70–99)
GLUCOSE-CAPILLARY: 220 mg/dL — AB (ref 70–99)
Glucose-Capillary: 348 mg/dL — ABNORMAL HIGH (ref 70–99)
Glucose-Capillary: 466 mg/dL — ABNORMAL HIGH (ref 70–99)
Glucose-Capillary: 228 mg/dL — ABNORMAL HIGH (ref 70–99)
Glucose-Capillary: 322 mg/dL — ABNORMAL HIGH (ref 70–99)

## 2013-11-28 LAB — TROPONIN I: Troponin I: <0.3 ng/mL (ref <0.30)

## 2013-11-28 MED ORDER — INSULIN ASPART 100 UNIT/ML ~~LOC~~ SOLN
20.0000 [IU] | Freq: Once | SUBCUTANEOUS | Status: AC
  Administered 2013-11-28: 20 [IU] via SUBCUTANEOUS

## 2013-11-28 MED ORDER — INSULIN DETEMIR 100 UNIT/ML ~~LOC~~ SOLN
15.0000 [IU] | Freq: Every day | SUBCUTANEOUS | Status: DC
  Administered 2013-11-28: 15 [IU] via SUBCUTANEOUS
  Filled 2013-11-28 (×2): qty 0.15

## 2013-11-28 NOTE — Progress Notes (Signed)
Patient blood sugar this morning was 466. Sliding scale parameters do not cover a blood sugar that high. Provider Elmahi was paged and a 1 time order for 20 U of novolog was given. Will administer and continue to monitor.   Lesieli Bresee J. Conley Canal RN

## 2013-11-28 NOTE — Progress Notes (Signed)
Utilization review completed.  

## 2013-11-28 NOTE — Progress Notes (Signed)
[facility] PROGRESS NOTE   Bridget Martinez [id number] DOB: [birth year] DOA: 11/27/2013 PCP: [name], MD  HPI/Subjective: Complaining about slight nausea Blood sugar is high this morning  Assessment/Plan: Active Problems:   Chronic systolic congestive heart failure, NYHA class 3   Hypertension   Hypoglycemia   Hypotension   Hypokalemia   Hypoglycemia  -Unclear etiology, no recent change in insulin dose, no evidence of infection. -Her renal function is within normal limits  -Check UA to evaluate for any underlying infection.  -Blood sugar is in the 400s will restart her Levemir insulin and sliding scale.  Hypotension  -Possibly secondary to hypovolemia  -Will obtain UA and blood cultures to evaluate for possible underlying infection  -Cycle cardiac enzymes follow  -BPs improved with cautious IV fluids  -Hold off antihypertensives for now.   H/o Hypertension  -As above hold off antihypertensives for now follow resume when clinically appropriate   Hypokalemia  -Replace potassium   Chronic systolic congestive heart failure, NYHA class 3  -Monitor strict I.'s and nose with cautious hydration as ready discussed  -Hold Lasix for today, follow and resume in a.m. as appropriate.  -Compensated   History of CAD  -She is chest pain free  -Continue aspirin and Plavix  -Follow resume Coreg when hypotension resolves.   Code Status: Full code Family Communication: Plan discussed with the patient. Disposition Plan: Remains inpatient   Consultants:  None  Procedures:  None  Antibiotics:   none none   Objective: Filed Vitals:   11/28/13 0425  BP: 117/69  Pulse: 69  Temp: 97.8 F (36.6 C)  Resp: 16    Intake/Output Summary (Last 24 hours) at 11/28/13 0958 Last data filed at 11/28/13 0929  Gross per 24 hour  Intake    563 ml  Output      3 ml  Net    560 ml   Filed Weights   11/27/13 0445 11/27/13 1226 11/28/13 0425  Weight: 68.04 kg  (150 lb) 74.3 kg (163 lb 12.8 oz) 74.2 kg (163 lb 9.3 oz)    Exam: General: Alert and awake, oriented x3, not in any acute distress. HEENT: anicteric sclera, pupils reactive to light and accommodation, EOMI CVS: S1-S2 clear, no murmur rubs or gallops Chest: clear to auscultation bilaterally, no wheezing, rales or rhonchi Abdomen: soft nontender, nondistended, normal bowel sounds, no organomegaly Extremities: no cyanosis, clubbing or edema noted bilaterally Neuro: Cranial nerves II-XII intact, no focal neurological deficits  Data Reviewed: Basic Metabolic Panel:  Recent Labs Lab 11/27/13 0530 11/27/13 0537 11/27/13 1613  NA 139 137  --   K 3.5* 3.3*  --   CL 99 100  --   CO2 26  --   --   GLUCOSE 36* 36*  --   BUN 11 9  --   CREATININE 0.92 1.00 0.98  CALCIUM 9.1  --   --    Liver Function Tests:  Recent Labs Lab 11/27/13 0530  AST 12  ALT 9  ALKPHOS 162*  BILITOT 0.4  PROT 6.5  ALBUMIN 3.1*   No results found for this basename: LIPASE, AMYLASE,  in the last 168 hours No results found for this basename: AMMONIA,  in the last 168 hours CBC:  Recent Labs Lab 11/27/13 0530 11/27/13 0537 11/27/13 1613 11/28/13 0921  WBC 7.5  --  9.2 6.9  HGB 10.2* 10.9* 9.8* 9.9*  HCT 30.5* 32.0* 29.4* 29.9*  MCV 89.4  --  90.2 91.2  PLT 295  --  281 277   Cardiac Enzymes:  Recent Labs Lab 11/27/13 0530 11/27/13 1613  TROPONINI <0.30 <0.30   BNP (last 3 results)  Recent Labs  10/28/13 0546 11/14/13 0405  PROBNP 11269.0* 1470.0*   CBG:  Recent Labs Lab 11/27/13 1554 11/27/13 2045 11/27/13 2359 11/28/13 0422 11/28/13 0752  GLUCAP 161* 306* 220* 348* 466*    Micro No results found for this or any previous visit (from the past 240 hour(s)).   Studies: No results found.  Scheduled Meds: . aspirin EC  325 mg Oral Daily  . atorvastatin  80 mg Oral q1800  . clopidogrel  75 mg Oral Q breakfast  . enoxaparin (LOVENOX) injection  40 mg Subcutaneous Daily    . escitalopram  10 mg Oral Daily  . insulin aspart  0-15 Units Subcutaneous TID WC  . insulin aspart  0-5 Units Subcutaneous QHS  . levothyroxine  125 mcg Oral QAC breakfast  . metoCLOPramide  10 mg Oral TID AC & HS  . nystatin cream   Topical BID  . pantoprazole  40 mg Oral Daily  . sodium chloride  3 mL Intravenous Q12H  . Vitamin D (Ergocalciferol)  50,000 Units Oral Weekly   Continuous Infusions:      Time spent: 35 minutes    [facility]  [facility] Pager [phone] If 7PM-7AM, please contact night-coverage at [email or web address], password [facility] 11/28/2013, 9:58 AM  LOS: 1 day

## 2013-11-29 DIAGNOSIS — E1110 Type 2 diabetes mellitus with ketoacidosis without coma: Secondary | ICD-10-CM

## 2013-11-29 DIAGNOSIS — I509 Heart failure, unspecified: Secondary | ICD-10-CM

## 2013-11-29 DIAGNOSIS — I5022 Chronic systolic (congestive) heart failure: Secondary | ICD-10-CM

## 2013-11-29 DIAGNOSIS — R011 Cardiac murmur, unspecified: Secondary | ICD-10-CM

## 2013-11-29 LAB — GLUCOSE, CAPILLARY
GLUCOSE-CAPILLARY: 46 mg/dL — AB (ref 70–99)
GLUCOSE-CAPILLARY: 132 mg/dL — AB (ref 70–99)
GLUCOSE-CAPILLARY: 389 mg/dL — AB (ref 70–99)
Glucose-Capillary: 20 mg/dL — CL (ref 70–99)
Glucose-Capillary: 73 mg/dL (ref 70–99)
Glucose-Capillary: 228 mg/dL — ABNORMAL HIGH (ref 70–99)
Glucose-Capillary: 64 mg/dL — ABNORMAL LOW (ref 70–99)
Glucose-Capillary: 103 mg/dL — ABNORMAL HIGH (ref 70–99)

## 2013-11-29 LAB — URINALYSIS, ROUTINE W REFLEX MICROSCOPIC
Bilirubin Urine: NEGATIVE
Hgb urine dipstick: NEGATIVE
Ketones, ur: NEGATIVE mg/dL
NITRITE: NEGATIVE
PH: 5 (ref 5.0–8.0)
PROTEIN: NEGATIVE mg/dL
Specific Gravity, Urine: 1.016 (ref 1.005–1.030)
Urobilinogen, UA: 0.2 mg/dL (ref 0.0–1.0)

## 2013-11-29 LAB — CBC
HEMATOCRIT: 31.2 % — AB (ref 36.0–46.0)
HEMOGLOBIN: 10.4 g/dL — AB (ref 12.0–15.0)
MCH: 30.1 pg (ref 26.0–34.0)
MCHC: 33.3 g/dL (ref 30.0–36.0)
MCV: 90.2 fL (ref 78.0–100.0)
Platelets: 304 10*3/uL (ref 150–400)
RBC: 3.46 MIL/uL — ABNORMAL LOW (ref 3.87–5.11)
RDW: 14.6 % (ref 11.5–15.5)
WBC: 5 10*3/uL (ref 4.0–10.5)

## 2013-11-29 LAB — BASIC METABOLIC PANEL
BUN: 8 mg/dL (ref 6–23)
CHLORIDE: 98 meq/L (ref 96–112)
CO2: 22 meq/L (ref 19–32)
Calcium: 9.2 mg/dL (ref 8.4–10.5)
Creatinine, Ser: 0.85 mg/dL (ref 0.50–1.10)
GFR calc Af Amer: 83 mL/min — ABNORMAL LOW (ref >90)
GFR calc non Af Amer: 71 mL/min — ABNORMAL LOW (ref >90)
Glucose, Bld: 102 mg/dL — ABNORMAL HIGH (ref 70–99)
Potassium: 5.1 mEq/L (ref 3.7–5.3)
Sodium: 137 mEq/L (ref 137–147)

## 2013-11-29 LAB — RAPID URINE DRUG SCREEN, HOSP PERFORMED
Amphetamines: NOT DETECTED
Barbiturates: NOT DETECTED
Benzodiazepines: NOT DETECTED
Cocaine: NOT DETECTED
OPIATES: NOT DETECTED
Tetrahydrocannabinol: NOT DETECTED

## 2013-11-29 LAB — URINE MICROSCOPIC-ADD ON

## 2013-11-29 MED ORDER — GLUCOSE 40 % PO GEL
ORAL | Status: AC
  Filled 2013-11-29: qty 1

## 2013-11-29 MED ORDER — INSULIN DETEMIR 100 UNIT/ML ~~LOC~~ SOLN
10.0000 [IU] | Freq: Every day | SUBCUTANEOUS | Status: DC
  Filled 2013-11-29: qty 0.1

## 2013-11-29 MED ORDER — FUROSEMIDE 40 MG PO TABS
40.0000 mg | ORAL_TABLET | Freq: Every day | ORAL | Status: DC
  Administered 2013-11-30: 40 mg via ORAL
  Filled 2013-11-29 (×2): qty 1

## 2013-11-29 MED ORDER — DEXTROSE 5 % IV SOLN
1.0000 g | INTRAVENOUS | Status: DC
  Administered 2013-11-29 – 2013-11-30 (×2): 1 g via INTRAVENOUS
  Filled 2013-11-29 (×2): qty 10

## 2013-11-29 NOTE — Progress Notes (Signed)
Pt and family requesting that we consult her Endocrinologist, Delrae Rend with [facility]. He can be reached at [phone]

## 2013-11-29 NOTE — Progress Notes (Signed)
Inpatient Diabetes Program Recommendations  AACE/ADA: New Consensus Statement on Inpatient Glycemic Control (2013)  Target Ranges:  Prepandial:   less than 140 mg/dL      Peak postprandial:   less than 180 mg/dL (1-2 hours)      Critically ill patients:  140 - 180 mg/dL   Reason for Visit: Consult  Diabetes history: Type 1 DM - Diagnosed 41 years ago according to pt Outpatient Diabetes medications: Lantus 20 units QHS, Novolog meal coverage + s/s Current orders for Inpatient glycemic control: Lantus 10 units QHS, Novolog sensitive tidwc.  Pt states she is getting her appetite back, ate approx 75-80% lunch. Sees Dr. Buddy Duty for diabetes management. States blood sugars very labile. Dr. Buddy Duty has recommended pt to explore possibility of Insulin pump. Pt states she does have some hypoglycemia at home. Dr. Buddy Duty adjusted insulin at 09/2013 office visit. Agree with reduction in basal insulin and addition of meal coverage insulin for post-prandial blood sugars.   Inpatient Diabetes Program Recommendations Insulin - Basal: Lantus 12 units QHS Correction (SSI): Novolog sensitive tidwc Insulin - Meal Coverage: Add Novolog 3 units tidwc for meal coverage insulin if pt eats >50% meals HgbA1C: 10.4% uncontrolled  Note: Discussed pros and cons of insulin pump, importance of checking blood sugars frequently when she doesn't feel well, and f/u with endo for any needed adjustments of insulins. Discussed HgbA1C results, may not be accurate with low H/H. Will continue to follow while inpatient.  Thank you. Lorenda Peck, RD, LDN, CDE Inpatient Diabetes Coordinator [phone]

## 2013-11-29 NOTE — Progress Notes (Signed)
Nutrition Brief Note  Patient identified on the Malnutrition Screening Tool (MST) Report. Pt report that she is eating well. Attributes recent weight loss to nausea, but states that this has resolved. Denies further poor appetite and has no significant muscle or fat loss present.  Wt Readings from Last 15 Encounters:  11/29/13 164 lb 7.4 oz (74.6 kg)  11/18/13 157 lb 3 oz (71.3 kg)  11/18/13 157 lb 3 oz (71.3 kg)  11/18/13 157 lb 3 oz (71.3 kg)  11/10/13 165 lb (74.844 kg)  10/29/13 174 lb 9.7 oz (79.2 kg)  09/27/13 180 lb (81.647 kg)  09/21/13 178 lb 12.8 oz (81.103 kg)  09/05/13 203 lb 4.2 oz (92.2 kg)  05/06/13 193 lb 1.9 oz (87.599 kg)  12/31/12 191 lb 8 oz (86.864 kg)  11/03/12 186 lb 8 oz (84.596 kg)  09/10/12 189 lb (85.73 kg)  07/30/12 182 lb 12 oz (82.895 kg)  07/14/12 182 lb 8 oz (82.781 kg)    Body mass index is 28.67 kg/(m^2). Patient meets criteria for overweight based on current BMI.   Current diet order is Carbohydrate Modified Medium, patient is consuming approximately 75% of meals at this time. Labs and medications reviewed.   No nutrition interventions warranted at this time. If nutrition issues arise, please consult RD.   Inda Coke MS, RD, LDN Inpatient Registered Dietitian Pager: [phone] After-hours pager: [phone]

## 2013-11-29 NOTE — Progress Notes (Signed)
UR completed. Patient changed to inpatient- requiring IV antibiotics.  

## 2013-11-29 NOTE — Progress Notes (Addendum)
[facility] PROGRESS NOTE   Bridget Martinez [id number] DOB: 07/17/1951 DOA: 11/27/2013 PCP: [name], MD  HPI/Subjective: Had an episode of hypoglycemia today with blood sugar down to 20. Seen while she was eating breakfast this morning denies any complaints.  Assessment/Plan: Principal Problem:   Hypoglycemia Active Problems:   Chronic systolic congestive heart failure, NYHA class 3   Hypertension   UTI (urinary tract infection)   Hypotension   Hypokalemia   Hypoglycemia  -Unclear etiology, no recent change in insulin dose, could be secondary to UTI. -Her renal function is within normal limits. -Check UA to evaluate for any underlying infection.  -Blood sugar is in the 400s will restart her Levemir insulin and sliding scale.  UTI -Urinalysis consistent with UTI, started on Rocephin. -Had recent UTI with Escherichia coli resistant to fluoroquinolones, she is allergic to sulfa, patient is on IV Rocephin. -Will adjust antibiotics according to culture results.  Hypotension  -Possibly secondary to hypovolemia  -Will obtain UA and blood cultures to evaluate for possible underlying infection  -Cycle cardiac enzymes negative for SES -BPs improved with cautious IV fluids  -Hold off antihypertensives for now.   H/o Hypertension  -As above hold off antihypertensives for now follow resume when clinically appropriate   Hypokalemia  -Potassium repleted.  Chronic systolic congestive heart failure, NYHA class 3  -Monitor strict I.'s and nose with cautious hydration as ready discussed  -Lasix held for one day, restarted., follow and resume in a.m. as appropriate.  -Compensated   History of CAD  -She is chest pain free  -Continue aspirin and Plavix  -Follow resume Coreg when hypotension resolves.   Code Status: Full code Family Communication: Plan discussed with the patient. Disposition Plan: Remains  inpatient   Consultants:  None  Procedures:  None  Antibiotics:   none none   Objective: Filed Vitals:   11/29/13 1356  BP: 118/61  Pulse: 83  Temp: 98.2 F (36.8 C)  Resp: 18    Intake/Output Summary (Last 24 hours) at 11/29/13 1434 Last data filed at 11/29/13 0918  Gross per 24 hour  Intake      0 ml  Output    300 ml  Net   -300 ml   Filed Weights   11/27/13 1226 11/28/13 0425 11/29/13 0530  Weight: 74.3 kg (163 lb 12.8 oz) 74.2 kg (163 lb 9.3 oz) 74.6 kg (164 lb 7.4 oz)    Exam: General: Alert and awake, oriented x3, not in any acute distress. HEENT: anicteric sclera, pupils reactive to light and accommodation, EOMI CVS: S1-S2 clear, no murmur rubs or gallops Chest: clear to auscultation bilaterally, no wheezing, rales or rhonchi Abdomen: soft nontender, nondistended, normal bowel sounds, no organomegaly Extremities: no cyanosis, clubbing or edema noted bilaterally Neuro: Cranial nerves II-XII intact, no focal neurological deficits  Data Reviewed: Basic Metabolic Panel:  Recent Labs Lab 11/27/13 0530 11/27/13 0537 11/27/13 1613 11/28/13 0921 11/28/13 1703 11/29/13 0757  NA 139 137  --  129* 134* 137  K 3.5* 3.3*  --  6.4* 4.8 5.1  CL 99 100  --  92* 97 98  CO2 26  --   --  15* 20 22  GLUCOSE 36* 36*  --  630* 120* 102*  BUN 11 9  --  12 11 8   CREATININE 0.92 1.00 0.98 0.89 0.90 0.85  CALCIUM 9.1  --   --  8.5 9.0 9.2   Liver Function Tests:  Recent Labs Lab 11/27/13 0530  AST  12  ALT 9  ALKPHOS 162*  BILITOT 0.4  PROT 6.5  ALBUMIN 3.1*   No results found for this basename: LIPASE, AMYLASE,  in the last 168 hours No results found for this basename: AMMONIA,  in the last 168 hours CBC:  Recent Labs Lab 11/27/13 0530 11/27/13 0537 11/27/13 1613 11/28/13 0921 11/29/13 0757  WBC 7.5  --  9.2 6.9 5.0  HGB 10.2* 10.9* 9.8* 9.9* 10.4*  HCT 30.5* 32.0* 29.4* 29.9* 31.2*  MCV 89.4  --  90.2 91.2 90.2  PLT 295  --  281 277 304    Cardiac Enzymes:  Recent Labs Lab 11/27/13 0530 11/27/13 1613 11/28/13 0921  TROPONINI <0.30 <0.30 <0.30   BNP (last 3 results)  Recent Labs  10/28/13 0546 11/14/13 0405  PROBNP 11269.0* 1470.0*   CBG:  Recent Labs Lab 11/29/13 0622 11/29/13 0640 11/29/13 0654 11/29/13 0835 11/29/13 1200  GLUCAP 20* 46* 73 132* 228*    Micro No results found for this or any previous visit (from the past 240 hour(s)).   Studies: No results found.  Scheduled Meds: . aspirin EC  325 mg Oral Daily  . atorvastatin  80 mg Oral q1800  . cefTRIAXone (ROCEPHIN)  IV  1 g Intravenous Q24H  . clopidogrel  75 mg Oral Q breakfast  . dextrose      . enoxaparin (LOVENOX) injection  40 mg Subcutaneous Daily  . escitalopram  10 mg Oral Daily  . [START ON 11/30/2013] furosemide  40 mg Oral Q breakfast  . insulin aspart  0-15 Units Subcutaneous TID WC  . insulin detemir  10 Units Subcutaneous QHS  . levothyroxine  125 mcg Oral QAC breakfast  . metoCLOPramide  10 mg Oral TID AC & HS  . nystatin cream   Topical BID  . pantoprazole  40 mg Oral Daily  . sodium chloride  3 mL Intravenous Q12H  . Vitamin D (Ergocalciferol)  50,000 Units Oral Weekly   Continuous Infusions:      Time spent: 35 minutes    [facility]  [facility] Pager [phone] If 7PM-7AM, please contact night-coverage at [email or web address], password [facility] 11/29/2013, 2:34 PM  LOS: 2 days

## 2013-11-29 NOTE — Progress Notes (Signed)
Hypoglycemic Event  CBG: 20  Treatment: 15 GM carbohydrate snack  Symptoms: Shaky  Follow-up CBG: [id number] CBG Result:46; glucose tube given; BS 73  Possible Reasons for Event: Unknown  Comments/MD notified: Sharen Counter  Remember to initiate Hypoglycemia Order Set & complete

## 2013-11-30 DIAGNOSIS — K3184 Gastroparesis: Secondary | ICD-10-CM

## 2013-11-30 DIAGNOSIS — E1149 Type 2 diabetes mellitus with other diabetic neurological complication: Secondary | ICD-10-CM

## 2013-11-30 LAB — URINE CULTURE

## 2013-11-30 LAB — BASIC METABOLIC PANEL
BUN: 12 mg/dL (ref 6–23)
CO2: 24 meq/L (ref 19–32)
CREATININE: 0.91 mg/dL (ref 0.50–1.10)
Calcium: 8.6 mg/dL (ref 8.4–10.5)
Chloride: 96 mEq/L (ref 96–112)
GFR calc Af Amer: 76 mL/min — ABNORMAL LOW (ref >90)
GFR calc non Af Amer: 66 mL/min — ABNORMAL LOW (ref >90)
Glucose, Bld: 541 mg/dL — ABNORMAL HIGH (ref 70–99)
Potassium: 5.4 mEq/L — ABNORMAL HIGH (ref 3.7–5.3)
Sodium: 134 mEq/L — ABNORMAL LOW (ref 137–147)

## 2013-11-30 LAB — GLUCOSE, CAPILLARY
Glucose-Capillary: >600 mg/dL (ref 70–99)
Glucose-Capillary: 268 mg/dL — ABNORMAL HIGH (ref 70–99)
Glucose-Capillary: 324 mg/dL — ABNORMAL HIGH (ref 70–99)
Glucose-Capillary: 88 mg/dL (ref 70–99)

## 2013-11-30 LAB — CBC
HCT: 29 % — ABNORMAL LOW (ref 36.0–46.0)
Hemoglobin: 9.6 g/dL — ABNORMAL LOW (ref 12.0–15.0)
MCH: 29.8 pg (ref 26.0–34.0)
MCHC: 33.1 g/dL (ref 30.0–36.0)
MCV: 90.1 fL (ref 78.0–100.0)
Platelets: 301 10*3/uL (ref 150–400)
RBC: 3.22 MIL/uL — ABNORMAL LOW (ref 3.87–5.11)
RDW: 14.7 % (ref 11.5–15.5)
WBC: 4.9 10*3/uL (ref 4.0–10.5)

## 2013-11-30 MED ORDER — INSULIN ASPART 100 UNIT/ML ~~LOC~~ SOLN
0.0000 [IU] | Freq: Three times a day (TID) | SUBCUTANEOUS | Status: DC
  Administered 2013-11-30: 9 [IU] via SUBCUTANEOUS
  Administered 2013-11-30: 5 [IU] via SUBCUTANEOUS

## 2013-11-30 MED ORDER — INSULIN DETEMIR 100 UNIT/ML ~~LOC~~ SOLN
12.0000 [IU] | Freq: Every day | SUBCUTANEOUS | Status: DC
  Administered 2013-11-30: 12 [IU] via SUBCUTANEOUS
  Filled 2013-11-30 (×4): qty 0.12

## 2013-11-30 MED ORDER — INSULIN ASPART 100 UNIT/ML ~~LOC~~ SOLN
3.0000 [IU] | Freq: Three times a day (TID) | SUBCUTANEOUS | Status: DC
  Administered 2013-11-30 (×2): 3 [IU] via SUBCUTANEOUS

## 2013-11-30 MED ORDER — CEFUROXIME AXETIL 500 MG PO TABS: 500.0000 mg | ORAL_TABLET | Freq: Two times a day (BID) | ORAL | Status: DC

## 2013-11-30 MED ORDER — INSULIN DETEMIR 100 UNIT/ML ~~LOC~~ SOLN: 12.0000 [IU] | Freq: Two times a day (BID) | SUBCUTANEOUS | Status: DC

## 2013-11-30 MED ORDER — INSULIN ASPART 100 UNIT/ML ~~LOC~~ SOLN: 100.0000 [IU] | Freq: Three times a day (TID) | SUBCUTANEOUS | Status: DC

## 2013-11-30 NOTE — Discharge Summary (Signed)
Addendum  Patient seen and examined, chart and data base reviewed.  I agree with the above assessment and plan.  For full details please see Mrs. Imogene Burn PA note.  Hypoglycemia and difficult to control DM2 patient to see Dr. Buddy Duty in AM.  UTI discharged home on Ceftin.   Birdie Hopes, MD [facility] Pager: [phone] 11/30/2013, 2:50 PM

## 2013-11-30 NOTE — Discharge Instructions (Signed)
Mrs. Berardi:   Dr. Buddy Duty advises taking 11 units of novolog with breakfast, 11 units with lunch, and 7 units with Dinner.  Then take 12 units of Levemir at bedtime and before breakfast.   Please see Dr. Buddy Duty tomorrow at 12:40 pm.

## 2013-11-30 NOTE — Discharge Summary (Signed)
NURSING PROGRESS NOTE  Bridget Martinez [id number] Discharge Data: 11/30/2013 4:39 PM Attending Provider: [name], MD [name], [name], MD     [name] to be D/C'd Home per MD order.  Discussed with the patient the After Visit Summary and all questions fully answered. All IV's discontinued with no bleeding noted. All belongings returned to patient for patient to take home.   Last Vital Signs:  Blood pressure 143/81, pulse 93, temperature 98.3 F (36.8 C), temperature source Oral, resp. rate 18, height 5' 3.5" (1.613 m), weight 72.3 kg (159 lb 6.3 oz), SpO2 95.00%.  Discharge Medication List   Medication List         aspirin EC 325 MG tablet  Take 325 mg by mouth daily.     carvedilol 6.25 MG tablet  Commonly known as:  COREG  Take 1 tablet (6.25 mg total) by mouth 2 (two) times daily with a meal.     cefUROXime 500 MG tablet  Commonly known as:  CEFTIN  Take 1 tablet (500 mg total) by mouth 2 (two) times daily with a meal.     clopidogrel 75 MG tablet  Commonly known as:  PLAVIX  Take 75 mg by mouth daily with breakfast.     escitalopram 10 MG tablet  Commonly known as:  LEXAPRO  Take 1 tablet (10 mg total) by mouth daily.     fluconazole 150 MG tablet  Commonly known as:  DIFLUCAN  Take 1 tablet (150 mg total) by mouth once a week.     furosemide 40 MG tablet  Commonly known as:  LASIX  Take 1 tablet (40 mg total) by mouth daily.     hyoscyamine 0.125 MG SL tablet  Commonly known as:  LEVSIN SL  Place 1 tablet (0.125 mg total) under the tongue every 6 (six) hours as needed for cramping.     insulin aspart 100 UNIT/ML injection  Commonly known as:  novoLOG  Inject 100 Units into the skin 3 (three) times daily with meals. 11 units at breakfast, 11 units at lunch, 7 units supper Plus 1 unit for every 35mg /dL above 100 mg/dL     insulin detemir 100 UNIT/ML injection  Commonly known as:  LEVEMIR  Inject 0.12 mLs (12 Units total) into the skin 2 (two)  times daily.     levothyroxine 125 MCG tablet  Commonly known as:  SYNTHROID, LEVOTHROID  Take 1 tablet (125 mcg total) by mouth daily.     metoCLOPramide 10 MG tablet  Commonly known as:  REGLAN  Take 1 tablet (10 mg total) by mouth 4 (four) times daily -  before meals and at bedtime.     nystatin cream  Commonly known as:  MYCOSTATIN  Apply topically 2 (two) times daily.     ondansetron 8 MG disintegrating tablet  Commonly known as:  ZOFRAN ODT  Take 1 tablet (8 mg total) by mouth every 8 (eight) hours as needed for nausea or vomiting.     pantoprazole 40 MG tablet  Commonly known as:  PROTONIX  Take 40 mg by mouth daily.     rosuvastatin 40 MG tablet  Commonly known as:  CRESTOR  Take 1 tablet (40 mg total) by mouth daily.     spironolactone 25 MG tablet  Commonly known as:  ALDACTONE  Take 12.5 mg by mouth daily.     Vitamin D (Ergocalciferol) 50000 UNITS Caps capsule  Commonly known as:  DRISDOL  Take 1 capsule by  mouth once a week. On saturday

## 2013-11-30 NOTE — Discharge Summary (Signed)
Physician Discharge Summary  Bridget Martinez [id number] DOB: [birth year] DOA: 11/27/2013  PCP: [name], MD  Admit date: 11/27/2013 Discharge date: 11/30/2013  Time spent: 45 minutes  Recommendations for Outpatient Follow-up:  BMET to check potassium in Dr. Cindra Eves office on 3/25  Appointment for DM management with Dr. Buddy Duty on 3/25. Follow up on urine cultures   Discharge Diagnoses:  Principal Problem:   Hypoglycemia Active Problems:   Chronic systolic congestive heart failure, NYHA class 3   Hypertension   UTI (urinary tract infection)   Hypotension   Hypokalemia   Discharge Condition: stable  Diet recommendation: diabetic diet   History of present illness:  Bridget Martinez is a 63 y.o. female history of diabetes mellitus type 1, hypertension, CAD, CHF, who was recently discharged from the hospital on 3/13 following hospitalization for DKA who presents with severe hypoglycemia. She states that last night she took her at bedtime dose Levemir as prescribed, and at about the 3:30 AM her husband reports that she was making 'some noises', and she appeared diaphoretic and poorly responsive so he called EMS. Per EDP patient initially had a blood glucose in the 30s, and it came up after D50. In the ED blood glucose of 28 was documented and she was treated with D50 and blood glucose improved to 274 on recheck up. The patient's mentation improved, and she was able to give a history at the time of my visit. She states that she has been on the same dose of Levemir since her discharge on the 13th, and was still to see her PCP as scheduled. She denies any nausea vomiting, chest pain, diarrhea and states she's had a good by mouth intake and it last p.m. In the ED her blood pressures initially was stable but she became hypotensive to the 70s and was bolused with IV fluids.    Hospital Course:  Hypoglycemia  -Unclear etiology, no recent change in insulin dose -Her renal function is within  normal limits.  -Check UA to evaluate for any underlying infection.  -In the hospital her cbg have been irradic. After discussing this with Dr. Buddy Duty it was felt best to discharge Bridget Martinez and have her see Dr. Buddy Duty in his office tomorrow.  Dr. Buddy Duty kindly recommended appropriate insulin dosing until the time of the visit.  Bridget Martinez understands the importance of her visit with Dr. Buddy Duty and agrees to go to her appointment.  UTI  -Urinalysis consistent with UTI, started on Rocephin.  -Had recent UTI with Escherichia coli resistant to fluoroquinolones, she is allergic to sulfa, patient is on IV Rocephin.  -Culture has not yet resulted.  Will check it after discharge.  In the mean time I will discharge her on a short course of Ceftin.  Hypotension  -Possibly secondary to hypovolemia  -Cycled cardiac enzymes negative for ACS -BPs improved with cautious IV fluids  -Hypertensives were held at admission, but will be restarted at discharge.  Hypokalemia  -Potassium repleted.   Chronic systolic congestive heart failure, NYHA class 3  -Monitor strict I.'s and nose with cautious hydration as ready discussed  -Lasix held for one day, restarted., follow and resume in a.m. as appropriate.  -Compensated   History of CAD  -She is chest pain free  -Continue aspirin and Plavix  -Follow resume Coreg when hypotension resolves.   Discharge Exam: Filed Vitals:   11/30/13 0512  BP: 143/81  Pulse: 93  Temp: 98.3 F (36.8 C)  Resp: 18   Filed  Weights   11/28/13 0425 11/29/13 0530 11/30/13 0344  Weight: 74.2 kg (163 lb 9.3 oz) 74.6 kg (164 lb 7.4 oz) 72.3 kg (159 lb 6.3 oz)    General: Alert and awake, oriented x3, not in any acute distress. Sitting in the chair eating breakfast. HEENT: anicteric sclera, pupils reactive to light and accommodation, EOMI  CVS: S1-S2 clear, no murmur rubs or gallops  Chest: clear to auscultation bilaterally, no wheezing, rales or rhonchi  Abdomen: soft nontender,  nondistended, normal bowel sounds, no organomegaly  Extremities: no cyanosis, clubbing or edema noted bilaterally  Neuro: Cranial nerves II-XII intact, no focal neurological deficits    Discharge Instructions      Discharge Orders   Future Appointments Provider Department Dept Phone   12/02/2013 10:45 AM Lysbeth Penner, [facility] [phone]   03/28/2014 8:00 AM Lysbeth Penner, [facility] [phone]   Future Orders Complete By Expires   Diet - low sodium heart healthy  As directed    Increase activity slowly  As directed        Medication List         aspirin EC 325 MG tablet  Take 325 mg by mouth daily.     carvedilol 6.25 MG tablet  Commonly known as:  COREG  Take 1 tablet (6.25 mg total) by mouth 2 (two) times daily with a meal.     cefUROXime 500 MG tablet  Commonly known as:  CEFTIN  Take 1 tablet (500 mg total) by mouth 2 (two) times daily with a meal.     clopidogrel 75 MG tablet  Commonly known as:  PLAVIX  Take 75 mg by mouth daily with breakfast.     escitalopram 10 MG tablet  Commonly known as:  LEXAPRO  Take 1 tablet (10 mg total) by mouth daily.     fluconazole 150 MG tablet  Commonly known as:  DIFLUCAN  Take 1 tablet (150 mg total) by mouth once a week.     furosemide 40 MG tablet  Commonly known as:  LASIX  Take 1 tablet (40 mg total) by mouth daily.     hyoscyamine 0.125 MG SL tablet  Commonly known as:  LEVSIN SL  Place 1 tablet (0.125 mg total) under the tongue every 6 (six) hours as needed for cramping.     insulin aspart 100 UNIT/ML injection  Commonly known as:  novoLOG  Inject 100 Units into the skin 3 (three) times daily with meals. 11 units at breakfast, 11 units at lunch, 7 units supper Plus 1 unit for every 35mg /dL above 100 mg/dL     insulin detemir 100 UNIT/ML injection  Commonly known as:  LEVEMIR  Inject 0.12 mLs (12 Units total) into the skin 2 (two) times daily.      levothyroxine 125 MCG tablet  Commonly known as:  SYNTHROID, LEVOTHROID  Take 1 tablet (125 mcg total) by mouth daily.     metoCLOPramide 10 MG tablet  Commonly known as:  REGLAN  Take 1 tablet (10 mg total) by mouth 4 (four) times daily -  before meals and at bedtime.     nystatin cream  Commonly known as:  MYCOSTATIN  Apply topically 2 (two) times daily.     ondansetron 8 MG disintegrating tablet  Commonly known as:  ZOFRAN ODT  Take 1 tablet (8 mg total) by mouth every 8 (eight) hours as needed for nausea or vomiting.     pantoprazole  40 MG tablet  Commonly known as:  PROTONIX  Take 40 mg by mouth daily.     rosuvastatin 40 MG tablet  Commonly known as:  CRESTOR  Take 1 tablet (40 mg total) by mouth daily.     spironolactone 25 MG tablet  Commonly known as:  ALDACTONE  Take 12.5 mg by mouth daily.     Vitamin D (Ergocalciferol) 50000 UNITS Caps capsule  Commonly known as:  DRISDOL  Take 1 capsule by mouth once a week. On saturday       Allergies  Allergen Reactions  . Sulfa Antibiotics Anaphylaxis and Swelling    Stiff neck also    Follow-up Information   Follow up with KERR,JEFFREY, MD On 12/01/2013. (12:40 pm to see Dr. Buddy Duty)    Specialty:  Endocrinology   Contact information:   [id number] [place] [address] [phone]        The results of significant diagnostics from this hospitalization (including imaging, microbiology, ancillary and laboratory) are listed below for reference.    Significant Diagnostic Studies:     Labs: Basic Metabolic Panel:  Recent Labs Lab 11/27/13 0530 11/27/13 0537 11/27/13 1613 11/28/13 0921 11/28/13 1703 11/29/13 0757 11/30/13 0427  NA 139 137  --  129* 134* 137 134*  K 3.5* 3.3*  --  6.4* 4.8 5.1 5.4*  CL 99 100  --  92* 97 98 96  CO2 26  --   --  15* 20 22 24   GLUCOSE 36* 36*  --  630* 120* 102* 541*  BUN 11 9  --  12 11 8 12   CREATININE 0.92 1.00 0.98 0.89 0.90 0.85 0.91   CALCIUM 9.1  --   --  8.5 9.0 9.2 8.6   Liver Function Tests:  Recent Labs Lab 11/27/13 0530  AST 12  ALT 9  ALKPHOS 162*  BILITOT 0.4  PROT 6.5  ALBUMIN 3.1*   CBC:  Recent Labs Lab 11/27/13 0530 11/27/13 0537 11/27/13 1613 11/28/13 0921 11/29/13 0757 11/30/13 0427  WBC 7.5  --  9.2 6.9 5.0 4.9  HGB 10.2* 10.9* 9.8* 9.9* 10.4* 9.6*  HCT 30.5* 32.0* 29.4* 29.9* 31.2* 29.0*  MCV 89.4  --  90.2 91.2 90.2 90.1  PLT 295  --  281 277 304 301   Cardiac Enzymes:  Recent Labs Lab 11/27/13 0530 11/27/13 1613 11/28/13 0921  TROPONINI <0.30 <0.30 <0.30   BNP: BNP (last 3 results)  Recent Labs  10/28/13 0546 11/14/13 0405  PROBNP 11269.0* 1470.0*   CBG:  Recent Labs Lab 11/29/13 1200 11/29/13 1651 11/29/13 2203 11/29/13 2236 11/30/13 0822  GLUCAP 228* 389* 64* 103* >600*       SignedKaren Kitchens [phone]  [facility] 11/30/2013, 11:33 AM

## 2013-11-30 NOTE — Care Management Note (Signed)
    Page 1 of 1   11/30/2013     11:35:42 AM   CARE MANAGEMENT NOTE 11/30/2013  Patient:  Bridget Martinez, Bridget Martinez   Account Number:  [id number]  Date Initiated:  11/30/2013  Documentation initiated by:  Tomi Bamberger  Subjective/Objective Assessment:   dx hypoglycemia  admit- lives with spouse.     Action/Plan:   Anticipated DC Date:  11/30/2013   Anticipated DC Plan:  HOME/SELF CARE      DC Planning Services  CM consult      Choice offered to / List presented to:             Status of service:  Completed, signed off Medicare Important Message given?   (If response is "NO", the following Medicare IM given date fields will be blank) Date Medicare IM given:   Date Additional Medicare IM given:    Discharge Disposition:  HOME/SELF CARE  Per UR Regulation:  Reviewed for med. necessity/level of care/duration of stay  If discussed at [place] of Stay Meetings, dates discussed:    Comments:  11/30/13 [place], BSN [phone] patient lives with spouse, patient is for dc today, no NCM referral, no needs anticipated.

## 2013-12-02 ENCOUNTER — Ambulatory Visit (INDEPENDENT_AMBULATORY_CARE_PROVIDER_SITE_OTHER): Payer: BC Managed Care – PPO | Admitting: Family Medicine

## 2013-12-02 ENCOUNTER — Encounter: Payer: Self-pay | Admitting: Family Medicine

## 2013-12-02 VITALS — BP 125/62 | HR 86 | Temp 97.3°F | Ht 63.0 in | Wt 160.8 lb

## 2013-12-02 DIAGNOSIS — E162 Hypoglycemia, unspecified: Secondary | ICD-10-CM

## 2013-12-02 NOTE — Progress Notes (Signed)
   Subjective:    Patient ID: Bridget Martinez, female    DOB: 1951-06-15, 63 y.o.   MRN: [id number]  HPI This 63 y.o. female presents for evaluation hospital follow up.  She was seen in the ED for hypoglycemia.  She has seen her endocrinologist and she has had her insulin adjusted. She is taking GERD meds and feels better.   Review of Systems    No chest pain, SOB, HA, dizziness, vision change, N/V, diarrhea, constipation, dysuria, urinary urgency or frequency, myalgias, arthralgias or rash.  Objective:   Physical Exam  Vital signs noted  Well developed well nourished female.  HEENT - Head atraumatic Normocephalic                Eyes - PERRLA, Conjuctiva - clear Sclera- Clear EOMI                Ears - EAC's Wnl TM's Wnl Gross Hearing WNL                Throat - oropharanx wnl Respiratory - Lungs CTA bilateral Cardiac - RRR S1 and S2 without murmur GI - Abdomen soft Nontender and bowel sounds active x 4 Extremities - No edema. Neuro - Grossly intact.      Assessment & Plan:  Hypoglycemia Discussed that she may need to decrease her levemir if her fsbs's are below 80 in the am. Follow up with endocrinologist.  Lysbeth Penner FNP

## 2013-12-07 DIAGNOSIS — E1049 Type 1 diabetes mellitus with other diabetic neurological complication: Secondary | ICD-10-CM

## 2013-12-07 DIAGNOSIS — K3184 Gastroparesis: Secondary | ICD-10-CM

## 2013-12-07 DIAGNOSIS — N390 Urinary tract infection, site not specified: Secondary | ICD-10-CM

## 2013-12-07 DIAGNOSIS — E1065 Type 1 diabetes mellitus with hyperglycemia: Secondary | ICD-10-CM

## 2013-12-07 DIAGNOSIS — N189 Chronic kidney disease, unspecified: Secondary | ICD-10-CM

## 2013-12-16 ENCOUNTER — Ambulatory Visit: Payer: BC Managed Care – PPO | Admitting: *Deleted

## 2013-12-28 ENCOUNTER — Other Ambulatory Visit: Payer: Self-pay | Admitting: Family Medicine

## 2013-12-30 NOTE — Telephone Encounter (Signed)
een 11/26/13  B [place]   Last Vit D level 1/19/  14.3 L

## 2014-01-12 ENCOUNTER — Other Ambulatory Visit: Payer: Self-pay | Admitting: *Deleted

## 2014-01-12 MED ORDER — CLOPIDOGREL BISULFATE 75 MG PO TABS: 75.0000 mg | ORAL_TABLET | Freq: Every day | ORAL | Status: DC

## 2014-03-28 ENCOUNTER — Ambulatory Visit: Payer: BC Managed Care – PPO | Admitting: Family Medicine

## 2014-03-30 ENCOUNTER — Other Ambulatory Visit: Payer: Self-pay | Admitting: Family Medicine

## 2014-04-08 ENCOUNTER — Encounter (HOSPITAL_COMMUNITY): Payer: Self-pay | Admitting: Vascular Surgery

## 2014-06-15 ENCOUNTER — Encounter (HOSPITAL_COMMUNITY): Payer: Self-pay

## 2014-06-15 ENCOUNTER — Ambulatory Visit (HOSPITAL_COMMUNITY)
Admission: RE | Admit: 2014-06-15 | Discharge: 2014-06-15 | Disposition: A | Discharge status: Home / Self Care | Payer: BC Managed Care – PPO | Source: Ambulatory Visit | Attending: Cardiology | Admitting: Cardiology

## 2014-06-15 ENCOUNTER — Telehealth (HOSPITAL_COMMUNITY): Payer: Self-pay | Admitting: Surgery

## 2014-06-15 VITALS — BP 160/70 | HR 85 | Wt 174.0 lb

## 2014-06-15 DIAGNOSIS — E785 Hyperlipidemia, unspecified: Secondary | ICD-10-CM | POA: Diagnosis not present

## 2014-06-15 DIAGNOSIS — I5022 Chronic systolic (congestive) heart failure: Secondary | ICD-10-CM | POA: Diagnosis not present

## 2014-06-15 DIAGNOSIS — E1049 Type 1 diabetes mellitus with other diabetic neurological complication: Secondary | ICD-10-CM

## 2014-06-15 DIAGNOSIS — Z794 Long term (current) use of insulin: Secondary | ICD-10-CM | POA: Diagnosis not present

## 2014-06-15 DIAGNOSIS — E118 Type 2 diabetes mellitus with unspecified complications: Secondary | ICD-10-CM | POA: Insufficient documentation

## 2014-06-15 DIAGNOSIS — I25810 Atherosclerosis of coronary artery bypass graft(s) without angina pectoris: Secondary | ICD-10-CM | POA: Insufficient documentation

## 2014-06-15 DIAGNOSIS — E1041 Type 1 diabetes mellitus with diabetic mononeuropathy: Secondary | ICD-10-CM

## 2014-06-15 DIAGNOSIS — Z951 Presence of aortocoronary bypass graft: Secondary | ICD-10-CM | POA: Diagnosis not present

## 2014-06-15 DIAGNOSIS — I10 Essential (primary) hypertension: Secondary | ICD-10-CM | POA: Insufficient documentation

## 2014-06-15 DIAGNOSIS — I2510 Atherosclerotic heart disease of native coronary artery without angina pectoris: Secondary | ICD-10-CM | POA: Diagnosis not present

## 2014-06-15 DIAGNOSIS — IMO0002 Reserved for concepts with insufficient information to code with codable children: Secondary | ICD-10-CM

## 2014-06-15 DIAGNOSIS — E1065 Type 1 diabetes mellitus with hyperglycemia: Secondary | ICD-10-CM

## 2014-06-15 LAB — BASIC METABOLIC PANEL
Anion gap: 18 — ABNORMAL HIGH (ref 5–15)
BUN: 23 mg/dL (ref 6–23)
CHLORIDE: 100 meq/L (ref 96–112)
CO2: 20 meq/L (ref 19–32)
Calcium: 9.3 mg/dL (ref 8.4–10.5)
Creatinine, Ser: 0.98 mg/dL (ref 0.50–1.10)
GFR calc Af Amer: 69 mL/min — ABNORMAL LOW (ref >90)
GFR calc non Af Amer: 60 mL/min — ABNORMAL LOW (ref >90)
Glucose, Bld: 266 mg/dL — ABNORMAL HIGH (ref 70–99)
Potassium: 4.2 mEq/L (ref 3.7–5.3)
SODIUM: 138 meq/L (ref 137–147)

## 2014-06-15 LAB — PRO B NATRIURETIC PEPTIDE: PRO B NATRI PEPTIDE: 650.1 pg/mL — AB (ref 0–125)

## 2014-06-15 MED ORDER — ASPIRIN EC 81 MG PO TBEC
81.0000 mg | DELAYED_RELEASE_TABLET | Freq: Every day | ORAL | Status: DC
Start: 2014-06-15 — End: 2014-11-04

## 2014-06-15 NOTE — Telephone Encounter (Signed)
Please see below  for instructions from Junie Bame NP.  I called Bridget Martinez twice-- left a message to indicate that she needs to call us back to discuss today's appt and to clarify medications.

## 2014-06-15 NOTE — Patient Instructions (Signed)
Call back and let me know all your medications you are taking including the pill size and dosage.  Doing great.  Will follow up in 4 months with ECHO.  Do the following things EVERYDAY: 1) Weigh yourself in the morning before breakfast. Write it down and keep it in a log. 2) Take your medicines as prescribed 3) Eat low salt foods-Limit salt (sodium) to 2000 mg per day.  4) Stay as active as you can everyday 5) Limit all fluids for the day to less than 2 liters 6)

## 2014-06-15 NOTE — Progress Notes (Signed)
Patient ID: Bridget Martinez, female   DOB: 03/02/1951, 63 y.o.   MRN: [id number]  PCP: Dr Laurance Flatten Endocrinologist: Dr Buddy Duty  HPI: 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   Admitted to the hospital 08/24/13 for altered mental status and was found to be severely acidotic and was intubated. Glucose on admission >700, temp 91.8 and BP 72/37. Blood cultures one out of two show coagulase-negative staph, urine cultures showed bacillus species  ECHO  11/21/11 35-40% 07/30/12  EF  45-50% Severe hypokinesis of inferior posterior wall 09/21/13 EF 35-40% with grade II DD (reviewed by Dr. Aundra Dubin)  Follow up for Heart Failure: Doing well. Reports she has been having some low blood sugars but medications have been changed. Denies SOB, orthopnea, or CP. +orthopnea. Able to walk about 1/8 mile without stopping. Able to go upstairs. Taking all of medications. Following a low salt diet and drinking low salt diet. Once again patient does not have medications and does not know what she is taking. Her list is different than our list and she is not sure which one is correct.    Labs 01/2013: K+ 4.0, Creatinine 1.31, BUN 35        08/2013: K+ 4.4, Cr 0.81        11/30/13: K+ 5.4, glucose 541, creatinine 0.91    ROS: All systems negative except as listed in HPI, PMH and Problem List.  Past Medical History  Diagnosis Date  . Diabetes mellitus     on insulin, with h/o DKA   . HTN (hypertension)   . Hyperlipidemia   . Venous stasis ulcers   . Arthritis   . Depression     anxiety  . 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 Hypertension   . Coronary artery disease     angina.  MI.   . Pneumonia        . GERD (gastroesophageal reflux disease)     Hiatal hernia.   . Hypothyroidism   . CHF (congestive heart failure)   . Tuberculosis   . Osteoporosis     Current Outpatient Prescriptions   Medication Sig Dispense Refill  . alendronate (FOSAMAX) 70 MG tablet Take 70 mg by mouth once a week.       Marland Kitchen aspirin EC 325 MG tablet Take 325 mg by mouth daily.      Marland Kitchen BAYER CONTOUR NEXT TEST test strip       . BAYER MICROLET LANCETS lancets       . carvedilol (COREG) 6.25 MG tablet Take 1 tablet (6.25 mg total) by mouth 2 (two) times daily with a meal.  60 tablet  2  . clopidogrel (PLAVIX) 75 MG tablet Take 1 tablet (75 mg total) by mouth daily with breakfast.  30 tablet  2  . escitalopram (LEXAPRO) 10 MG tablet Take 1 tablet (10 mg total) by mouth daily.  30 tablet  11  . furosemide (LASIX) 40 MG tablet Take 1 tablet (40 mg total) by mouth daily.  30 tablet  3  . hyoscyamine (LEVSIN SL) 0.125 MG SL tablet PLACE 1 TABLET UNDER TONGUE EVERY 6 HOURS AS NEEDED FOR CRAMPING  30 tablet  1  . insulin aspart (NOVOLOG) 100 UNIT/ML injection Inject 100 Units into the skin 3 (three) times daily with meals. 11 units at breakfast, 11 units at lunch, 7 units  supper Plus 1 unit for every 35mg /dL above 100 mg/dL  10 mL  11  . insulin detemir (LEVEMIR) 100 UNIT/ML injection Inject 15 Units into the skin 2 (two) times daily.      Marland Kitchen levothyroxine (SYNTHROID, LEVOTHROID) 125 MCG tablet Take 1 tablet (125 mcg total) by mouth daily.  30 tablet  11  . metoCLOPramide (REGLAN) 10 MG tablet Take 1 tablet (10 mg total) by mouth 4 (four) times daily -  before meals and at bedtime.  60 tablet  1  . nystatin cream (MYCOSTATIN) Apply topically 2 (two) times daily.  30 g  0  . ondansetron (ZOFRAN ODT) 8 MG disintegrating tablet Take 1 tablet (8 mg total) by mouth every 8 (eight) hours as needed for nausea or vomiting.  20 tablet  1  . pantoprazole (PROTONIX) 40 MG tablet Take 40 mg by mouth daily.      . rosuvastatin (CRESTOR) 40 MG tablet Take 1 tablet (40 mg total) by mouth daily.  30 tablet  11  . spironolactone (ALDACTONE) 25 MG tablet Take 12.5 mg by mouth daily.      . Vitamin D, Ergocalciferol, (DRISDOL) 50000 UNITS  CAPS capsule Take 1 capsule by mouth once a week. On saturday      . Vitamin D, Ergocalciferol, (DRISDOL) 50000 UNITS CAPS capsule TAKE 1 CAPSULE (50,000 UNITS TOTAL) BY MOUTH EVERY 7 (SEVEN) DAYS.  18 capsule  0   No current facility-administered medications for this encounter.    Filed Vitals:   06/15/14 1010  BP: 160/70  Pulse: 85  Weight: 174 lb (78.926 kg)  SpO2: 99%    PHYSICAL EXAM:  General:  Well appearing. No resp difficulty HEENT: normal Neck: supple. JVP flat Carotids 2+ bilaterally; no bruits. No lymphadenopathy or thryomegaly appreciated. Cor: PMI normal. Regular rate & rhythm. No rubs, gallops or murmurs. Lungs: clear Abdomen: soft, nontender, nondistended. No hepatosplenomegaly. No bruits or masses. Good bowel sounds. Extremities: no cyanosis, clubbing, rash, TED hose intact Neuro: alert & orientedx3, cranial nerves grossly intact. Moves all 4 extremities w/o difficulty. Affect pleasant.  ASSESSMENT & PLAN:   1) Chronic systolic HF: ICM, EF 123456 (09/2013) - NYHA II symptoms and volume status stable. Once again not sure what dosage of lasix patient is taking because she does not know her medications. Have asked her to please go home and call us back and clarify medications. Would continue whatever dose she currently is taking. Check BMET and pro-BNP. - She has not taken any medications today and reports that her blood pressure is always low, SBP 100s. - Will repeat ECHO next visit.  - Reinforced the need and importance of daily weights, a low sodium diet, and fluid restriction (less than 2 L a day). Instructed to call the HF clinic if weight increases more than 3 lbs overnight or 5 lbs in a week.  2) HTN - Elevated today. As above patient not sure what medications she is taking and her list is different than ours. Has to bring medications with her next visit or will not see her.  3) HLD- -Managed by [facility]. Continue statin 4) CAD s/p CABG x 4 -  Continue plavix, ASA, and statin. Patient should not be on ASA 325 mg and should be on 81 mg. Have asked her to change.  - no s/s of ischemia 5) DM2 - Has had multiple issues with hyperglycemia and hypoglycemia in the past. Managed by Dr. Buddy Duty.   F/U 4 months with  ECHO  Junie Bame B NP-C 3:27 PM

## 2014-06-15 NOTE — Telephone Encounter (Signed)
Renal function stable. Please call and figure out medications she is taking and also tell her to follow up with endocrinologist glucose [phone].

## 2014-06-27 ENCOUNTER — Other Ambulatory Visit (HOSPITAL_COMMUNITY): Payer: Self-pay | Admitting: Internal Medicine

## 2014-07-26 ENCOUNTER — Inpatient Hospital Stay (HOSPITAL_COMMUNITY): Payer: BC Managed Care – PPO

## 2014-07-26 ENCOUNTER — Encounter (HOSPITAL_COMMUNITY): Payer: Self-pay | Admitting: Radiology

## 2014-07-26 ENCOUNTER — Emergency Department (HOSPITAL_COMMUNITY): Payer: BC Managed Care – PPO

## 2014-07-26 ENCOUNTER — Inpatient Hospital Stay (HOSPITAL_COMMUNITY)
Admission: EM | Admit: 2014-07-26 | Discharge: 2014-07-31 | DRG: 871 | Disposition: A | Discharge status: Home / Self Care | Payer: BC Managed Care – PPO | Attending: Internal Medicine | Admitting: Internal Medicine

## 2014-07-26 DIAGNOSIS — Z951 Presence of aortocoronary bypass graft: Secondary | ICD-10-CM | POA: Diagnosis not present

## 2014-07-26 DIAGNOSIS — M1990 Unspecified osteoarthritis, unspecified site: Secondary | ICD-10-CM | POA: Diagnosis present

## 2014-07-26 DIAGNOSIS — K219 Gastro-esophageal reflux disease without esophagitis: Secondary | ICD-10-CM | POA: Diagnosis present

## 2014-07-26 DIAGNOSIS — N184 Chronic kidney disease, stage 4 (severe): Secondary | ICD-10-CM | POA: Diagnosis present

## 2014-07-26 DIAGNOSIS — M810 Age-related osteoporosis without current pathological fracture: Secondary | ICD-10-CM | POA: Diagnosis present

## 2014-07-26 DIAGNOSIS — Z7982 Long term (current) use of aspirin: Secondary | ICD-10-CM | POA: Diagnosis not present

## 2014-07-26 DIAGNOSIS — Z882 Allergy status to sulfonamides status: Secondary | ICD-10-CM

## 2014-07-26 DIAGNOSIS — E876 Hypokalemia: Secondary | ICD-10-CM | POA: Diagnosis not present

## 2014-07-26 DIAGNOSIS — I129 Hypertensive chronic kidney disease with stage 1 through stage 4 chronic kidney disease, or unspecified chronic kidney disease: Secondary | ICD-10-CM | POA: Diagnosis present

## 2014-07-26 DIAGNOSIS — R011 Cardiac murmur, unspecified: Secondary | ICD-10-CM

## 2014-07-26 DIAGNOSIS — E1143 Type 2 diabetes mellitus with diabetic autonomic (poly)neuropathy: Secondary | ICD-10-CM | POA: Diagnosis present

## 2014-07-26 DIAGNOSIS — E785 Hyperlipidemia, unspecified: Secondary | ICD-10-CM | POA: Diagnosis present

## 2014-07-26 DIAGNOSIS — Z9289 Personal history of other medical treatment: Secondary | ICD-10-CM

## 2014-07-26 DIAGNOSIS — N183 Chronic kidney disease, stage 3 unspecified: Secondary | ICD-10-CM

## 2014-07-26 DIAGNOSIS — R739 Hyperglycemia, unspecified: Secondary | ICD-10-CM

## 2014-07-26 DIAGNOSIS — Z79899 Other long term (current) drug therapy: Secondary | ICD-10-CM

## 2014-07-26 DIAGNOSIS — Z7902 Long term (current) use of antithrombotics/antiplatelets: Secondary | ICD-10-CM | POA: Diagnosis not present

## 2014-07-26 DIAGNOSIS — Z794 Long term (current) use of insulin: Secondary | ICD-10-CM | POA: Diagnosis not present

## 2014-07-26 DIAGNOSIS — I739 Peripheral vascular disease, unspecified: Secondary | ICD-10-CM | POA: Diagnosis present

## 2014-07-26 DIAGNOSIS — E1010 Type 1 diabetes mellitus with ketoacidosis without coma: Secondary | ICD-10-CM | POA: Diagnosis present

## 2014-07-26 DIAGNOSIS — Z452 Encounter for adjustment and management of vascular access device: Secondary | ICD-10-CM

## 2014-07-26 DIAGNOSIS — Z978 Presence of other specified devices: Secondary | ICD-10-CM

## 2014-07-26 DIAGNOSIS — G9340 Encephalopathy, unspecified: Secondary | ICD-10-CM | POA: Diagnosis present

## 2014-07-26 DIAGNOSIS — R1031 Right lower quadrant pain: Secondary | ICD-10-CM

## 2014-07-26 DIAGNOSIS — I517 Cardiomegaly: Secondary | ICD-10-CM

## 2014-07-26 DIAGNOSIS — A419 Sepsis, unspecified organism: Principal | ICD-10-CM | POA: Diagnosis present

## 2014-07-26 DIAGNOSIS — Z8611 Personal history of tuberculosis: Secondary | ICD-10-CM | POA: Diagnosis not present

## 2014-07-26 DIAGNOSIS — F329 Major depressive disorder, single episode, unspecified: Secondary | ICD-10-CM | POA: Diagnosis present

## 2014-07-26 DIAGNOSIS — J189 Pneumonia, unspecified organism: Secondary | ICD-10-CM

## 2014-07-26 DIAGNOSIS — E039 Hypothyroidism, unspecified: Secondary | ICD-10-CM | POA: Diagnosis present

## 2014-07-26 DIAGNOSIS — I5042 Chronic combined systolic (congestive) and diastolic (congestive) heart failure: Secondary | ICD-10-CM | POA: Diagnosis present

## 2014-07-26 DIAGNOSIS — I152 Hypertension secondary to endocrine disorders: Secondary | ICD-10-CM | POA: Diagnosis present

## 2014-07-26 DIAGNOSIS — J9600 Acute respiratory failure, unspecified whether with hypoxia or hypercapnia: Secondary | ICD-10-CM | POA: Diagnosis present

## 2014-07-26 DIAGNOSIS — E875 Hyperkalemia: Secondary | ICD-10-CM | POA: Diagnosis present

## 2014-07-26 DIAGNOSIS — E1111 Type 2 diabetes mellitus with ketoacidosis with coma: Secondary | ICD-10-CM | POA: Insufficient documentation

## 2014-07-26 DIAGNOSIS — I2510 Atherosclerotic heart disease of native coronary artery without angina pectoris: Secondary | ICD-10-CM | POA: Diagnosis present

## 2014-07-26 DIAGNOSIS — R6510 Systemic inflammatory response syndrome (SIRS) of non-infectious origin without acute organ dysfunction: Secondary | ICD-10-CM | POA: Diagnosis present

## 2014-07-26 DIAGNOSIS — E872 Acidosis, unspecified: Secondary | ICD-10-CM | POA: Insufficient documentation

## 2014-07-26 DIAGNOSIS — F32A Depression, unspecified: Secondary | ICD-10-CM

## 2014-07-26 DIAGNOSIS — J9601 Acute respiratory failure with hypoxia: Secondary | ICD-10-CM

## 2014-07-26 DIAGNOSIS — J9690 Respiratory failure, unspecified, unspecified whether with hypoxia or hypercapnia: Secondary | ICD-10-CM | POA: Diagnosis present

## 2014-07-26 DIAGNOSIS — N179 Acute kidney failure, unspecified: Secondary | ICD-10-CM | POA: Diagnosis present

## 2014-07-26 DIAGNOSIS — K3184 Gastroparesis: Secondary | ICD-10-CM | POA: Diagnosis present

## 2014-07-26 DIAGNOSIS — R000 Tachycardia, unspecified: Secondary | ICD-10-CM | POA: Insufficient documentation

## 2014-07-26 DIAGNOSIS — E1043 Type 1 diabetes mellitus with diabetic autonomic (poly)neuropathy: Secondary | ICD-10-CM | POA: Diagnosis present

## 2014-07-26 DIAGNOSIS — E162 Hypoglycemia, unspecified: Secondary | ICD-10-CM

## 2014-07-26 DIAGNOSIS — B373 Candidiasis of vulva and vagina: Secondary | ICD-10-CM

## 2014-07-26 DIAGNOSIS — E870 Hyperosmolality and hypernatremia: Secondary | ICD-10-CM | POA: Diagnosis present

## 2014-07-26 DIAGNOSIS — I5022 Chronic systolic (congestive) heart failure: Secondary | ICD-10-CM | POA: Diagnosis present

## 2014-07-26 DIAGNOSIS — E1311 Other specified diabetes mellitus with ketoacidosis with coma: Secondary | ICD-10-CM

## 2014-07-26 DIAGNOSIS — I252 Old myocardial infarction: Secondary | ICD-10-CM

## 2014-07-26 DIAGNOSIS — R4182 Altered mental status, unspecified: Secondary | ICD-10-CM

## 2014-07-26 DIAGNOSIS — E1065 Type 1 diabetes mellitus with hyperglycemia: Secondary | ICD-10-CM | POA: Diagnosis present

## 2014-07-26 DIAGNOSIS — I878 Other specified disorders of veins: Secondary | ICD-10-CM | POA: Diagnosis present

## 2014-07-26 DIAGNOSIS — E1049 Type 1 diabetes mellitus with other diabetic neurological complication: Secondary | ICD-10-CM | POA: Diagnosis present

## 2014-07-26 DIAGNOSIS — E0811 Diabetes mellitus due to underlying condition with ketoacidosis with coma: Secondary | ICD-10-CM

## 2014-07-26 DIAGNOSIS — N189 Chronic kidney disease, unspecified: Secondary | ICD-10-CM

## 2014-07-26 DIAGNOSIS — E1159 Type 2 diabetes mellitus with other circulatory complications: Secondary | ICD-10-CM | POA: Diagnosis present

## 2014-07-26 DIAGNOSIS — I5023 Acute on chronic systolic (congestive) heart failure: Secondary | ICD-10-CM

## 2014-07-26 DIAGNOSIS — Z4659 Encounter for fitting and adjustment of other gastrointestinal appliance and device: Secondary | ICD-10-CM

## 2014-07-26 DIAGNOSIS — IMO0002 Reserved for concepts with insufficient information to code with codable children: Secondary | ICD-10-CM | POA: Diagnosis present

## 2014-07-26 DIAGNOSIS — B3731 Acute candidiasis of vulva and vagina: Secondary | ICD-10-CM

## 2014-07-26 DIAGNOSIS — I10 Essential (primary) hypertension: Secondary | ICD-10-CM

## 2014-07-26 LAB — URINALYSIS, ROUTINE W REFLEX MICROSCOPIC
Bilirubin Urine: NEGATIVE
Glucose, UA: >1000 mg/dL — AB
Ketones, ur: >80 mg/dL — AB
Leukocytes, UA: NEGATIVE
Nitrite: NEGATIVE
PROTEIN: NEGATIVE mg/dL
Specific Gravity, Urine: 1.023 (ref 1.005–1.030)
UROBILINOGEN UA: 0.2 mg/dL (ref 0.0–1.0)
pH: 5 (ref 5.0–8.0)

## 2014-07-26 LAB — I-STAT CHEM 8, ED
BUN: 29 mg/dL — ABNORMAL HIGH (ref 6–23)
Calcium, Ion: 1.47 mmol/L — ABNORMAL HIGH (ref 1.13–1.30)
Chloride: 102 mEq/L (ref 96–112)
Creatinine, Ser: 1.3 mg/dL — ABNORMAL HIGH (ref 0.50–1.10)
Glucose, Bld: >700 mg/dL (ref 70–99)
HEMATOCRIT: 38 % (ref 36.0–46.0)
HEMOGLOBIN: 12.9 g/dL (ref 12.0–15.0)
POTASSIUM: 6.5 meq/L — AB (ref 3.7–5.3)
SODIUM: 130 meq/L — AB (ref 137–147)
TCO2: 8 mmol/L (ref 0–100)

## 2014-07-26 LAB — BASIC METABOLIC PANEL
Anion gap: 27 — ABNORMAL HIGH (ref 5–15)
BUN: 21 mg/dL (ref 6–23)
CALCIUM: 9.9 mg/dL (ref 8.4–10.5)
CO2: 16 meq/L — AB (ref 19–32)
Chloride: 104 mEq/L (ref 96–112)
Creatinine, Ser: 1.01 mg/dL (ref 0.50–1.10)
GFR calc Af Amer: 67 mL/min — ABNORMAL LOW (ref >90)
GFR calc non Af Amer: 58 mL/min — ABNORMAL LOW (ref >90)
GLUCOSE: 163 mg/dL — AB (ref 70–99)
Potassium: 3.5 mEq/L — ABNORMAL LOW (ref 3.7–5.3)
Sodium: 147 mEq/L (ref 137–147)

## 2014-07-26 LAB — GLUCOSE, CAPILLARY
GLUCOSE-CAPILLARY: 561 mg/dL — AB (ref 70–99)
GLUCOSE-CAPILLARY: 165 mg/dL — AB (ref 70–99)
GLUCOSE-CAPILLARY: 168 mg/dL — AB (ref 70–99)
Glucose-Capillary: 277 mg/dL — ABNORMAL HIGH (ref 70–99)
Glucose-Capillary: 228 mg/dL — ABNORMAL HIGH (ref 70–99)

## 2014-07-26 LAB — CBC WITH DIFFERENTIAL/PLATELET
Basophils Absolute: 0 10*3/uL (ref 0.0–0.1)
Basophils Relative: 0 % (ref 0–1)
Eosinophils Absolute: 0 10*3/uL (ref 0.0–0.7)
Eosinophils Relative: 0 % (ref 0–5)
HEMATOCRIT: 34.5 % — AB (ref 36.0–46.0)
Hemoglobin: 11 g/dL — ABNORMAL LOW (ref 12.0–15.0)
LYMPHS ABS: 2.5 10*3/uL (ref 0.7–4.0)
Lymphocytes Relative: 15 % (ref 12–46)
MCH: 29 pg (ref 26.0–34.0)
MCHC: 31.9 g/dL (ref 30.0–36.0)
MCV: 91 fL (ref 78.0–100.0)
Monocytes Absolute: 0.7 10*3/uL (ref 0.1–1.0)
Monocytes Relative: 4 % (ref 3–12)
NEUTROS ABS: 13.3 10*3/uL — AB (ref 1.7–7.7)
Neutrophils Relative %: 81 % — ABNORMAL HIGH (ref 43–77)
Platelets: 326 10*3/uL (ref 150–400)
RBC: 3.79 MIL/uL — ABNORMAL LOW (ref 3.87–5.11)
RDW: 14.1 % (ref 11.5–15.5)
WBC: 16.5 10*3/uL — ABNORMAL HIGH (ref 4.0–10.5)

## 2014-07-26 LAB — POCT I-STAT 3, ART BLOOD GAS (G3+)
Acid-base deficit: 19 mmol/L — ABNORMAL HIGH (ref 0.0–2.0)
Bicarbonate: 8.3 mEq/L — ABNORMAL LOW (ref 20.0–24.0)
O2 SAT: 99 %
TCO2: 9 mmol/L (ref 0–100)
pCO2 arterial: 21 mmHg — ABNORMAL LOW (ref 35.0–45.0)
pH, Arterial: 7.191 — CL (ref 7.350–7.450)
pO2, Arterial: 184 mmHg — ABNORMAL HIGH (ref 80.0–100.0)

## 2014-07-26 LAB — COMPREHENSIVE METABOLIC PANEL
ALT: 9 U/L (ref 0–35)
AST: 14 U/L (ref 0–37)
Albumin: 3 g/dL — ABNORMAL LOW (ref 3.5–5.2)
Alkaline Phosphatase: 182 U/L — ABNORMAL HIGH (ref 39–117)
BUN: 30 mg/dL — ABNORMAL HIGH (ref 6–23)
CALCIUM: 11.5 mg/dL — AB (ref 8.4–10.5)
CO2: <7 mEq/L — CL (ref 19–32)
Chloride: 87 mEq/L — ABNORMAL LOW (ref 96–112)
Creatinine, Ser: 1.26 mg/dL — ABNORMAL HIGH (ref 0.50–1.10)
GFR calc Af Amer: 51 mL/min — ABNORMAL LOW (ref >90)
GFR calc non Af Amer: 44 mL/min — ABNORMAL LOW (ref >90)
Glucose, Bld: 1029 mg/dL (ref 70–99)
Potassium: 7.1 mEq/L (ref 3.7–5.3)
Sodium: 131 mEq/L — ABNORMAL LOW (ref 137–147)
TOTAL PROTEIN: 6.7 g/dL (ref 6.0–8.3)
Total Bilirubin: 0.4 mg/dL (ref 0.3–1.2)

## 2014-07-26 LAB — TROPONIN I
TROPONIN I: 1.08 ng/mL — AB (ref <0.30)
Troponin I: 0.69 ng/mL (ref <0.30)

## 2014-07-26 LAB — CBG MONITORING, ED
Glucose-Capillary: >600 mg/dL (ref 70–99)
Glucose-Capillary: >600 mg/dL (ref 70–99)

## 2014-07-26 LAB — I-STAT ARTERIAL BLOOD GAS, ED
Acid-base deficit: 24 mmol/L — ABNORMAL HIGH (ref 0.0–2.0)
Bicarbonate: 6.6 mEq/L — ABNORMAL LOW (ref 20.0–24.0)
O2 SAT: 100 %
PCO2 ART: 27.8 mmHg — AB (ref 35.0–45.0)
PH ART: 6.982 — AB (ref 7.350–7.450)
PO2 ART: 486 mmHg — AB (ref 80.0–100.0)
Patient temperature: 98.6
TCO2: 7 mmol/L (ref 0–100)

## 2014-07-26 LAB — PRO B NATRIURETIC PEPTIDE: PRO B NATRI PEPTIDE: 2156 pg/mL — AB (ref 0–125)

## 2014-07-26 LAB — MRSA PCR SCREENING: MRSA BY PCR: NEGATIVE

## 2014-07-26 LAB — URINE MICROSCOPIC-ADD ON

## 2014-07-26 LAB — I-STAT CG4 LACTIC ACID, ED: Lactic Acid, Venous: 5.65 mmol/L — ABNORMAL HIGH (ref 0.5–2.2)

## 2014-07-26 LAB — LACTIC ACID, PLASMA: Lactic Acid, Venous: 7.5 mmol/L — ABNORMAL HIGH (ref 0.5–2.2)

## 2014-07-26 LAB — APTT: aPTT: 23 seconds — ABNORMAL LOW (ref 24–37)

## 2014-07-26 LAB — PROTIME-INR
INR: 1.15 (ref 0.00–1.49)
PROTHROMBIN TIME: 14.8 s (ref 11.6–15.2)

## 2014-07-26 LAB — TSH: TSH: 2.7 u[IU]/mL (ref 0.350–4.500)

## 2014-07-26 LAB — PROCALCITONIN: PROCALCITONIN: 9.4 ng/mL

## 2014-07-26 MED ORDER — SODIUM CHLORIDE 0.9 % IV SOLN: 1.0000 g | Freq: Once | INTRAVENOUS | Status: DC

## 2014-07-26 MED ORDER — LIDOCAINE HCL (CARDIAC) 20 MG/ML IV SOLN
INTRAVENOUS | Status: AC
Start: 2014-07-26 — End: 2014-07-27
  Filled 2014-07-26: qty 5

## 2014-07-26 MED ORDER — SODIUM CHLORIDE 0.9 % IV SOLN
INTRAVENOUS | Status: DC
  Administered 2014-07-26: 14:00:00 via INTRAVENOUS

## 2014-07-26 MED ORDER — CALCIUM CHLORIDE 10 % IV SOLN
1.0000 g | Freq: Once | INTRAVENOUS | Status: AC
  Administered 2014-07-26: 1 g via INTRAVENOUS

## 2014-07-26 MED ORDER — DEXTROSE-NACL 5-0.45 % IV SOLN: INTRAVENOUS | Status: DC

## 2014-07-26 MED ORDER — METOPROLOL TARTRATE 1 MG/ML IV SOLN
2.5000 mg | INTRAVENOUS | Status: DC | PRN
  Filled 2014-07-26: qty 5

## 2014-07-26 MED ORDER — VANCOMYCIN HCL IN DEXTROSE 1-5 GM/200ML-% IV SOLN: 1000.0000 mg | Freq: Once | INTRAVENOUS | Status: DC

## 2014-07-26 MED ORDER — CETYLPYRIDINIUM CHLORIDE 0.05 % MT LIQD
7.0000 mL | Freq: Four times a day (QID) | OROMUCOSAL | Status: DC
  Administered 2014-07-27 – 2014-07-28 (×6): 7 mL via OROMUCOSAL

## 2014-07-26 MED ORDER — SODIUM CHLORIDE 0.9 % IV SOLN
INTRAVENOUS | Status: DC
  Filled 2014-07-26: qty 2.5

## 2014-07-26 MED ORDER — SODIUM POLYSTYRENE SULFONATE 15 GM/60ML PO SUSP: 30.0000 g | Freq: Once | ORAL | Status: DC

## 2014-07-26 MED ORDER — SUCCINYLCHOLINE CHLORIDE 20 MG/ML IJ SOLN
INTRAMUSCULAR | Status: AC
  Filled 2014-07-26: qty 1

## 2014-07-26 MED ORDER — ROCURONIUM BROMIDE 50 MG/5ML IV SOLN
INTRAVENOUS | Status: AC
  Administered 2014-07-26: 100 mg
  Filled 2014-07-26: qty 2

## 2014-07-26 MED ORDER — SODIUM CHLORIDE 0.9 % IV SOLN
1.0000 g | Freq: Once | INTRAVENOUS | Status: AC
  Administered 2014-07-26: 1 g via INTRAVENOUS
  Filled 2014-07-26: qty 10

## 2014-07-26 MED ORDER — SODIUM BICARBONATE 8.4 % IV SOLN
50.0000 meq | Freq: Once | INTRAVENOUS | Status: AC
  Administered 2014-07-26: 50 meq via INTRAVENOUS

## 2014-07-26 MED ORDER — SODIUM CHLORIDE 0.9 % IV SOLN: INTRAVENOUS | Status: DC

## 2014-07-26 MED ORDER — VANCOMYCIN HCL IN DEXTROSE 750-5 MG/150ML-% IV SOLN
750.0000 mg | Freq: Two times a day (BID) | INTRAVENOUS | Status: DC
  Administered 2014-07-27 – 2014-07-28 (×3): 750 mg via INTRAVENOUS
  Filled 2014-07-26 (×4): qty 150

## 2014-07-26 MED ORDER — SODIUM BICARBONATE 8.4 % IV SOLN
INTRAVENOUS | Status: AC
  Administered 2014-07-26: 50 meq via INTRAVENOUS
  Filled 2014-07-26: qty 50

## 2014-07-26 MED ORDER — CHLORHEXIDINE GLUCONATE 0.12 % MT SOLN
15.0000 mL | Freq: Two times a day (BID) | OROMUCOSAL | Status: DC
  Administered 2014-07-26 – 2014-07-28 (×4): 15 mL via OROMUCOSAL
  Filled 2014-07-26 (×4): qty 15

## 2014-07-26 MED ORDER — NOREPINEPHRINE BITARTRATE 1 MG/ML IV SOLN
2.0000 ug/min | INTRAVENOUS | Status: DC
  Filled 2014-07-26: qty 4

## 2014-07-26 MED ORDER — PIPERACILLIN-TAZOBACTAM 3.375 G IVPB 30 MIN
3.3750 g | Freq: Once | INTRAVENOUS | Status: AC
  Administered 2014-07-26: 3.375 g via INTRAVENOUS
  Filled 2014-07-26: qty 50

## 2014-07-26 MED ORDER — DEXTROSE-NACL 5-0.45 % IV SOLN
INTRAVENOUS | Status: DC
  Administered 2014-07-26: 20:00:00 via INTRAVENOUS

## 2014-07-26 MED ORDER — FENTANYL CITRATE 0.05 MG/ML IJ SOLN
25.0000 ug/h | INTRAMUSCULAR | Status: DC
  Administered 2014-07-26 – 2014-07-27 (×2): 50 ug/h via INTRAVENOUS
  Administered 2014-07-27: 25 ug/h via INTRAVENOUS
  Filled 2014-07-26 (×3): qty 50

## 2014-07-26 MED ORDER — DEXTROSE 50 % IV SOLN: 25.0000 mL | INTRAVENOUS | Status: DC | PRN

## 2014-07-26 MED ORDER — PANTOPRAZOLE SODIUM 40 MG IV SOLR: 40.0000 mg | INTRAVENOUS | Status: DC

## 2014-07-26 MED ORDER — PIPERACILLIN-TAZOBACTAM 3.375 G IVPB
3.3750 g | Freq: Three times a day (TID) | INTRAVENOUS | Status: DC
  Administered 2014-07-26 – 2014-07-27 (×2): 3.375 g via INTRAVENOUS
  Filled 2014-07-26 (×4): qty 50

## 2014-07-26 MED ORDER — SODIUM BICARBONATE 8.4 % IV SOLN
50.0000 meq | Freq: Once | INTRAVENOUS | Status: AC
  Administered 2014-07-26: 50 meq via INTRAVENOUS
  Filled 2014-07-26: qty 50

## 2014-07-26 MED ORDER — STERILE WATER FOR INJECTION IV SOLN
INTRAVENOUS | Status: DC
  Administered 2014-07-26 – 2014-07-27 (×3): via INTRAVENOUS
  Filled 2014-07-26 (×5): qty 850

## 2014-07-26 MED ORDER — INSULIN ASPART 100 UNIT/ML IV SOLN
INTRAVENOUS | Status: AC
Start: 2014-07-26 — End: 2014-07-27
  Filled 2014-07-26: qty 1

## 2014-07-26 MED ORDER — PANTOPRAZOLE SODIUM 40 MG IV SOLR
40.0000 mg | INTRAVENOUS | Status: DC
  Administered 2014-07-27 – 2014-07-28 (×2): 40 mg via INTRAVENOUS
  Filled 2014-07-26 (×4): qty 40

## 2014-07-26 MED ORDER — SODIUM CHLORIDE 0.9 % IV SOLN
INTRAVENOUS | Status: DC
  Administered 2014-07-26: 10 [IU]/h via INTRAVENOUS
  Administered 2014-07-26: 5.4 [IU]/h via INTRAVENOUS
  Filled 2014-07-26: qty 2.5

## 2014-07-26 MED ORDER — INSULIN REGULAR BOLUS VIA INFUSION: 0.0000 [IU] | Freq: Three times a day (TID) | INTRAVENOUS | Status: DC

## 2014-07-26 MED ORDER — ETOMIDATE 2 MG/ML IV SOLN
INTRAVENOUS | Status: AC
  Administered 2014-07-26: 20 mg
  Filled 2014-07-26: qty 20

## 2014-07-26 MED ORDER — INSULIN ASPART 100 UNIT/ML ~~LOC~~ SOLN
10.0000 [IU] | Freq: Once | SUBCUTANEOUS | Status: AC
  Administered 2014-07-26: 10 [IU] via INTRAVENOUS

## 2014-07-26 MED ORDER — VANCOMYCIN HCL 10 G IV SOLR
1500.0000 mg | Freq: Once | INTRAVENOUS | Status: AC
  Administered 2014-07-26: 1500 mg via INTRAVENOUS
  Filled 2014-07-26: qty 1500

## 2014-07-26 MED ORDER — SODIUM CHLORIDE 0.9 % IV BOLUS (SEPSIS)
1000.0000 mL | Freq: Once | INTRAVENOUS | Status: AC
  Administered 2014-07-26: 1000 mL via INTRAVENOUS

## 2014-07-26 MED ORDER — SODIUM CHLORIDE 0.9 % IV SOLN
1.0000 mg/h | INTRAVENOUS | Status: DC
  Filled 2014-07-26: qty 10

## 2014-07-26 NOTE — Progress Notes (Signed)
Chaplain responded to page to provide support to staff. [name] reported to ED with high blood sugar and was unresponsive.  Per EMS patient husband is in route to hospital.  Will follow as needed.   07/26/14 1300  Clinical Encounter Type  Visited With Patient not available;Health care provider  Visit Type Initial;Critical Care  Referral From Nurse  Spiritual Encounters  Spiritual Needs Prayer;Emotional  Cristopher Peru, pager [phone]

## 2014-07-26 NOTE — Procedures (Signed)
Arterial Catheter Insertion Procedure Note Bridget Martinez [id number] Jun 15, 1951  Procedure: Insertion of Arterial Catheter  Indications: Blood pressure monitoring and Frequent blood sampling  Procedure Details Consent: Unable to obtain consent because of altered level of consciousness. Time Out: Verified patient identification, verified procedure, site/side was marked, verified correct patient position, special equipment/implants available, medications/allergies/relevent history reviewed, required imaging and test results available.  Performed  Maximum sterile technique was used including antiseptics, cap, gloves, gown, hand hygiene, mask and sheet. Skin prep: Chlorhexidine; local anesthetic administered 20 gauge catheter was inserted into right radial artery using the Seldinger technique.  Evaluation Blood flow good; BP tracing good. Complications: No apparent complications.  Procedure went well with no complications.  Rt will continue to monitor.  Marita Snellen B 07/26/2014

## 2014-07-26 NOTE — ED Notes (Signed)
Pt sent by EMS with AMS and cbg reading high, unsure of last insulin.  Unknown length of how long she has been altered.  Tachpneic and tachycardic.  Pt has some peaked t-waves

## 2014-07-26 NOTE — Procedures (Signed)
Arterial Catheter Insertion Procedure Note Bridget Martinez [id number] September 23, 1950  Procedure: Insertion of Arterial Catheter  Indications: Blood pressure monitoring and Frequent blood sampling  Procedure Details Consent: Risks of procedure as well as the alternatives and risks of each were explained to the (patient/caregiver).  Consent for procedure obtained. and Unable to obtain consent because of emergent medical necessity. Time Out: Verified patient identification, verified procedure, site/side was marked, verified correct patient position, special equipment/implants available, medications/allergies/relevent history reviewed, required imaging and test results available.  Performed  Maximum sterile technique was used including antiseptics, cap, gloves, gown, hand hygiene, mask and sheet. Skin prep: Chlorhexidine; local anesthetic administered 20 gauge catheter was inserted into right radial artery using the Seldinger technique.  Evaluation Blood flow good; BP tracing good. Complications: No apparent complications.   Ulice Dash 07/26/2014

## 2014-07-26 NOTE — Progress Notes (Signed)
  Echocardiogram 2D Echocardiogram has been performed.  Bridget Martinez 07/26/2014, 6:00 PM

## 2014-07-26 NOTE — ED Notes (Signed)
Lactic acid and chem 8 results given to Dr. Darl Householder

## 2014-07-26 NOTE — Progress Notes (Signed)
CRITICAL VALUE ALERT  Critical value received: Troponin 0.69  Date of notification:  07/26/2014  Time of notification:  1921  Critical value read back:Yes.    Nurse who received alert:  Elliot Gurney  MD notified (1st page):  Dr. Stevenson Clinch  Time of first page:  1920  MD notified (2nd page):  Time of second page:  Responding MD:  Dr. Stevenson Clinch  Time MD responded:  [phone]

## 2014-07-26 NOTE — ED Notes (Signed)
Admitting aware of all medications given and infusing. Will place central line for more access. Spoke in detail with husband regarding procedure. Consent signed.

## 2014-07-26 NOTE — ED Notes (Signed)
Physician and Resident preparing for intubation.

## 2014-07-26 NOTE — ED Notes (Signed)
Patient intubated 22 at lip ETT 7.5

## 2014-07-26 NOTE — H&P (Addendum)
PULMONARY / CRITICAL CARE MEDICINE   Name: Bridget Martinez MRN: [id number] DOB: [birth year]    ADMISSION DATE:  07/26/2014 CONSULTATION DATE:  07/26/2014  REFERRING MD :  EDP  CHIEF COMPLAINT:  AMS  INITIAL PRESENTATION: 63 year old female presented to ED 11/17 with altered mental status after husband notified EMS. She was found the be profoundly hyperglycemic, acidemic, and required intubation in ED. PCCM has been asked to see for admission.   STUDIES:  CT head 11/17 >>>  SIGNIFICANT EVENTS: 11/17 Intubated admitted to ICU  HISTORY OF PRESENT ILLNESS:  63 year old female with PMH as below, which includes DM, HTN, CAD, PAD, hypothyroid presented to [facility] ED 11/17 with altered mental status. Her husband called EMS when he found her to be altered at home. She had not been "feeling herself" for last 3 days. She was acting strange. CBG per EMS read as "high". In ED she was found to be profoundly hyperglycemic, acidemic, and hyperkalemic. She was altered to the extent that she was not protecting her airway, or breathing sufficiently. She was intubated in ED. She was started on IVF, insulin, and bicarb gtts. PCCM was asked to see for ICU admission.   PAST MEDICAL HISTORY :   has a past medical history of Diabetes mellitus; HTN (hypertension); Hyperlipidemia; Venous stasis ulcers; Arthritis; Depression; Myocardial infarction; Hypertension; Coronary artery disease; Pneumonia; GERD (gastroesophageal reflux disease); Hypothyroidism; CHF (congestive heart failure); Tuberculosis; and Osteoporosis.  has past surgical history that includes Cardiac catheterization; Tonsillectomy; Coronary artery bypass graft; Coronary artery bypass graft (2012); Esophagogastroduodenoscopy (N/A, 11/17/2013); and Colonoscopy (N/A, 11/18/2013). Prior to Admission medications   Medication Sig Start Date End Date Taking? Authorizing Provider  alendronate (FOSAMAX) 70 MG tablet Take 70 mg by mouth once a week.  11/04/13   Historical  Provider, MD  aspirin EC 81 MG tablet Take 1 tablet (81 mg total) by mouth daily. 06/15/14   Rande Brunt, NP  BAYER CONTOUR NEXT TEST test strip  11/12/13   Historical Provider, MD  BAYER MICROLET LANCETS lancets  09/29/13   Historical Provider, MD  carvedilol (COREG) 6.25 MG tablet Take 1 tablet (6.25 mg total) by mouth 2 (two) times daily with a meal. 10/29/13   Melton Alar, PA-C  clopidogrel (PLAVIX) 75 MG tablet Take 1 tablet (75 mg total) by mouth daily with breakfast. 01/12/14   Lysbeth Penner, FNP  escitalopram (LEXAPRO) 10 MG tablet Take 1 tablet (10 mg total) by mouth daily. 09/27/13   Lysbeth Penner, FNP  furosemide (LASIX) 40 MG tablet Take 1 tablet (40 mg total) by mouth daily. 10/29/13   Bobby Rumpf [place], PA-C  hyoscyamine (LEVSIN SL) 0.125 MG SL tablet PLACE 1 TABLET UNDER TONGUE EVERY 6 HOURS AS NEEDED FOR CRAMPING    Lysbeth Penner, FNP  insulin aspart (NOVOLOG) 100 UNIT/ML injection Inject 100 Units into the skin 3 (three) times daily with meals. 15 units at breakfast, 20 units at lunch, 20 units supper Plus 1 unit for every 35mg /dL above 100 mg/dL 11/30/13   Bobby Rumpf [place], PA-C  insulin detemir (LEVEMIR) 100 UNIT/ML injection Inject 14 Units into the skin 2 (two) times daily.  11/30/13   Melton Alar, PA-C  levothyroxine (SYNTHROID, LEVOTHROID) 125 MCG tablet Take 1 tablet (125 mcg total) by mouth daily. 09/27/13   Lysbeth Penner, FNP  metoCLOPramide (REGLAN) 10 MG tablet Take 1 tablet (10 mg total) by mouth 4 (four) times daily -  before meals and  at bedtime. 11/19/13   Kelvin Cellar, MD  ondansetron (ZOFRAN ODT) 8 MG disintegrating tablet Take 1 tablet (8 mg total) by mouth every 8 (eight) hours as needed for nausea or vomiting. 11/10/13   Lysbeth Penner, FNP  pantoprazole (PROTONIX) 40 MG tablet Take 40 mg by mouth daily. 11/26/11 12/31/13  Jolaine Artist, MD  rosuvastatin (CRESTOR) 40 MG tablet Take 1 tablet (40 mg total) by mouth daily. 09/27/13   Lysbeth Penner, FNP   spironolactone (ALDACTONE) 25 MG tablet Take 12.5 mg by mouth daily.    Historical Provider, MD  spironolactone (ALDACTONE) 25 MG tablet TAKE 1/2 TABLET DAILY 06/27/14   Jolaine Artist, MD  Vitamin D, Ergocalciferol, (DRISDOL) 50000 UNITS CAPS capsule Take 1 capsule by mouth once a week. On saturday 09/29/13   Historical Provider, MD   Allergies  Allergen Reactions  . Sulfa Antibiotics Anaphylaxis and Swelling    Stiff neck also     FAMILY HISTORY:  indicated that her mother is deceased. She indicated that her father is deceased. She indicated that her maternal grandmother is deceased. She indicated that her maternal grandfather is deceased. She indicated that her paternal grandmother is deceased. She indicated that her paternal grandfather is deceased.  SOCIAL HISTORY:  reports that she has never smoked. She does not have any smokeless tobacco history on file. She reports that she does not drink alcohol or use illicit drugs.  REVIEW OF SYSTEMS:  Unable, intubated  SUBJECTIVE:   VITAL SIGNS: Pulse Rate:  [123-131] 131 (11/17 1345) Resp:  [24-32] 25 (11/17 1345) BP: (100-114)/(38-44) 100/38 mmHg (11/17 1345) SpO2:  [100 %] 100 % (11/17 1345) Weight:  [81.647 kg (180 lb)] 81.647 kg (180 lb) (11/17 1419) HEMODYNAMICS:   VENTILATOR SETTINGS:   INTAKE / OUTPUT:  Intake/Output Summary (Last 24 hours) at 07/26/14 1420 Last data filed at 07/26/14 1350  Gross per 24 hour  Intake   3000 ml  Output      0 ml  Net   3000 ml    PHYSICAL EXAMINATION: General:  Elderly female, appears older than stated age, intubated on vent Neuro:  Sedated on vent HEENT:  [place]/AT, No JVD noted Cardiovascular: Tachy, regular, no MRG Lungs:  Respirations even, clear bilateral breath sounds Abdomen:  Soft, non-distended Musculoskeletal:  No acute deformity, peripheral pulses intact Skin: Intact, no edema noted.   LABS:  CBC  Recent Labs Lab 07/26/14 1329 07/26/14 1340  WBC 16.5*  --   HGB  11.0* 12.9  HCT 34.5* 38.0  PLT 326  --    Coag's No results for input(s): APTT, INR in the last 168 hours. BMET  Recent Labs Lab 07/26/14 1340  NA 130*  K 6.5*  CL 102  BUN 29*  CREATININE 1.30*  GLUCOSE >700*   Electrolytes No results for input(s): CALCIUM, MG, PHOS in the last 168 hours. Sepsis Markers  Recent Labs Lab 07/26/14 1340  LATICACIDVEN 5.65*   ABG  Recent Labs Lab 07/26/14 1402  PHART 6.982*  PCO2ART 27.8*  PO2ART 486.0*   Liver Enzymes No results for input(s): AST, ALT, ALKPHOS, BILITOT, ALBUMIN in the last 168 hours. Cardiac Enzymes No results for input(s): TROPONINI, PROBNP in the last 168 hours. Glucose  Recent Labs Lab 07/26/14 1344  GLUCAP >600*    Imaging No results found.   ASSESSMENT / PLAN:  PULMONARY OETT 11/17 A: Acute respiratory failure 2nd to AMS  P:   Full vent support, hyperventilate. Daily CXR VAP prevention  per protocol Follow ABGs  CARDIOVASCULAR A:  Tachycardia, sinus. Likely stress response New LBBB H/o CHF, HTN  P:  Trend lactic acid Trend troponin Tele Give volume and sedate adequately Scheduled metoprolol Hold PO coreg, lasix, statin, aldactone 2D echo ordered.  RENAL A:   High AG metabolic acidosis- lactic, ketoacidosis Hyperkalemia AKI  P:   IVF hydration per DKA protocol Serial Bmet q 2 hours Bicarb gtt If K does not resolve will need renal involved, UOP is more than adequate right now.  GASTROINTESTINAL A:   GERD  P:   NPO SUP: IV protonix  HEMATOLOGIC A:   No acute issues  P:  Restart plavix and start VTE ppx if head CT negative SCDs Follow CBC  INFECTIOUS A:   Concern for infectious process, elevated lactic  P:   R/o urine and pulmonary infection BCx2 11/17 >> UC 11/17 >>> Sputum 11/17 >> Abx: Zosyn, start date 11/17, day 1 Abx: Vancomycin, start date 11/17, day 1   ENDOCRINE A:   Diabetic ketoacidosis, severe DM Hypothyroid  P:   Initiate DKA  protocol IVF, Insulin gtt, and K replacement per protocol TSH Continue synthroid Diabetes coordinator consult placed, frequent DKAs.  NEUROLOGIC A:   Acute encephalopahty  P:   RASS goal: -1 Versed gtt for sedation Fentanyl gtt for analgesia PAD protocol   FAMILY  - Updates: Husband 11/17  - Inter-disciplinary family meet or Palliative Care meeting due by:  11/24  Georgann Housekeeper, ACNP [facility] Pager [phone] or [phone]   TODAY'S SUMMARY: Frequent DKA, now rather severe.  VDRF.  ?Sepsis, will pan culture and broad spectrum abx.  F/U ABG and BMET.  If K remains elevated then will consider calling renal.  My CC time 45 min independent of procedure and extender time.  Patient seen and examined, agree with above note.  I dictated the care and orders written for this patient under my direction.  Rush Farmer, MD [phone]

## 2014-07-26 NOTE — ED Notes (Signed)
Husband reports that patient is a type one diabetic and when she woke up this am she did not know who he was. On arrival to department patient was responsive to painful stiumli but was not talking. Pt was intubated due to AMS and RR above 30.

## 2014-07-26 NOTE — Procedures (Signed)
Central Venous Catheter Insertion Procedure Note LOGYNN PESHEK [id number] [birth year]  Procedure: Insertion of Central Venous Catheter Indications: Assessment of intravascular volume and Drug and/or fluid administration  Procedure Details Consent: Risks of procedure as well as the alternatives and risks of each were explained to the (patient/caregiver).  Consent for procedure obtained. Time Out: Verified patient identification, verified procedure, site/side was marked, verified correct patient position, special equipment/implants available, medications/allergies/relevent history reviewed, required imaging and test results available.  Performed  Maximum sterile technique was used including antiseptics, cap, gloves, gown, hand hygiene, mask and sheet. Skin prep: Chlorhexidine; local anesthetic administered A antimicrobial bonded/coated triple lumen catheter was placed in the left internal jugular vein using the Seldinger technique.  Evaluation Blood flow good Complications: No apparent complications Patient did tolerate procedure well. Chest X-ray ordered to verify placement.  CXR: pending.  U/S used in placement.  Metzli Pollick 07/26/2014, 2:56 PM

## 2014-07-26 NOTE — ED Notes (Signed)
Critical care paged regarding temp

## 2014-07-26 NOTE — Progress Notes (Signed)
ANTIBIOTIC CONSULT NOTE - INITIAL  Pharmacy Consult for Vancomycin, Zosyn Indication: rule out sepsis  Allergies  Allergen Reactions  . Sulfa Antibiotics Anaphylaxis and Swelling    Stiff neck also     Patient Measurements: Height: 5\' 5"  (165.1 cm) Weight: 165 lb 5.5 oz (75 kg) IBW/kg (Calculated) : 57 Vital Signs: Temp: 99.7 F (37.6 C) (11/17 2100) Temp Source: Core (Comment) (11/17 2000) BP: 123/38 mmHg (11/17 2100) Pulse Rate: 90 (11/17 2100) Intake/Output from previous day:   Intake/Output from this shift: Total I/O In: 520 [I.V.:520] Out: 200 [Urine:200]  Labs:  Recent Labs  07/26/14 1329 07/26/14 1340  WBC 16.5*  --   HGB 11.0* 12.9  PLT 326  --   CREATININE 1.26* 1.30*   Estimated Creatinine Clearance: 44.3 mL/min (by C-G formula based on Cr of 1.3). No results for input(s): VANCOTROUGH, VANCOPEAK, VANCORANDOM, GENTTROUGH, GENTPEAK, GENTRANDOM, TOBRATROUGH, TOBRAPEAK, TOBRARND, AMIKACINPEAK, AMIKACINTROU, AMIKACIN in the last 72 hours.   Microbiology: Recent Results (from the past 720 hour(s))  MRSA PCR Screening     Status: None   Collection Time: 07/26/14  4:50 PM  Result Value Ref Range Status   MRSA by PCR NEGATIVE NEGATIVE Final    Comment:        The GeneXpert MRSA Assay (FDA approved for NASAL specimens only), is one component of a comprehensive MRSA colonization surveillance program. It is not intended to diagnose MRSA infection nor to guide or monitor treatment for MRSA infections.     Medical History: Past Medical History  Diagnosis Date  . Diabetes mellitus     on insulin, with h/o DKA   . HTN (hypertension)   . Hyperlipidemia   . Venous stasis ulcers   . Arthritis   . Depression     anxiety  . 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 Hypertension   . Coronary artery disease     angina.  MI.   . Pneumonia        . GERD (gastroesophageal reflux disease)     Hiatal hernia.   .  Hypothyroidism   . CHF (congestive heart failure)   . Tuberculosis   . Osteoporosis     Medications:  Anti-infectives    Start     Dose/Rate Route Frequency Ordered Stop   07/26/14 1415  vancomycin (VANCOCIN) 1,500 mg in sodium chloride 0.9 % 500 mL IVPB     1,500 mg250 mL/hr over 120 Minutes Intravenous  Once 07/26/14 1403 07/26/14 1639   07/26/14 1345  vancomycin (VANCOCIN) IVPB 1000 mg/200 mL premix  Status:  Discontinued     1,000 mg200 mL/hr over 60 Minutes Intravenous  Once 07/26/14 1336 07/26/14 1403   07/26/14 1345  piperacillin-tazobactam (ZOSYN) IVPB 3.375 g     3.375 g100 mL/hr over 30 Minutes Intravenous  Once 07/26/14 1336 07/26/14 1501     Assessment: 66 YOF admitted with AMS, hyperglycemia, acidemia requiring intubation with concern for sepsis for presumed urinary or lung source. Vancomycin 1500mg  x1 given in ED at 14:39PM. Zosyn 3.375g IV x1 given at 14:29PM.   SCr 1.3 with estimated CrCl ~ 40-44mL/min.  WBC 16.5 on admission. LA up to 7.5. Hypothermic temps on admission, now 99.7.  Blood, urine, and sputum cultures pending.   Goal of Therapy:  Vancomycin trough level 15-20 mcg/ml  Plan:  1. Continue Zosyn 3.375g IV q8h -each dose over 4 hours. 2. Vancomycin 750mg  IV q12h.  3. Monitor renal function,  culture results, and clinical status.   Sloan Leiter, PharmD, BCPS Clinical Pharmacist [phone] 07/26/2014,9:36 PM

## 2014-07-26 NOTE — ED Provider Notes (Signed)
CSN: [id number]     Arrival date & time 07/26/14  1315 History   First MD Initiated Contact with Patient 07/26/14 1337     Chief Complaint  Patient presents with  . Altered Mental Status    Patient is a 63 y.o. female presenting with altered mental status. The history is provided by the EMS personnel.  Altered Mental Status Presenting symptoms: lethargy and partial responsiveness   Severity:  Severe Most recent episode: unknown when this began. Episode history:  Unable to specify Duration: unknown; found by husband. Timing:  Constant Progression:  Unable to specify Chronicity:  New Context comment:  History of DM on insulin Associated symptoms: weakness (generalized)     Past Medical History  Diagnosis Date  . Diabetes mellitus     on insulin, with h/o DKA   . HTN (hypertension)   . Hyperlipidemia   . Venous stasis ulcers   . Arthritis   . Depression     anxiety  . 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 Hypertension   . Coronary artery disease     angina.  MI.   . Pneumonia        . GERD (gastroesophageal reflux disease)     Hiatal hernia.   . Hypothyroidism   . CHF (congestive heart failure)   . Tuberculosis   . Osteoporosis    Past Surgical History  Procedure Laterality Date  . Cardiac catheterization    . Tonsillectomy    . Coronary artery bypass graft      Dr. Prescott Gum in 09/2011   . Coronary artery bypass graft  2012  . Esophagogastroduodenoscopy N/A 11/17/2013    Procedure: ESOPHAGOGASTRODUODENOSCOPY (EGD);  Surgeon: Lafayette Dragon, MD;  Location: [facility] ENDOSCOPY;  Service: Endoscopy;  Laterality: N/A;  . Colonoscopy N/A 11/18/2013    Procedure: COLONOSCOPY;  Surgeon: Lafayette Dragon, MD;  Location: [facility] ENDOSCOPY;  Service: Endoscopy;  Laterality: N/A;   Family History  Problem Relation Age of Onset  . Heart disease Father   . Multiple sclerosis Father   . Hypertension Mother   . Hyperthyroidism Mother   .  Diabetes Cousin     Multiple maternal cousins with type 2 diabetes mellitus  . Diabetes Maternal Uncle     Type 1 diabetes mellitus  . Heart attack Paternal Grandfather   . Heart disease Paternal Grandfather    History  Substance Use Topics  . Smoking status: Never Smoker   . Smokeless tobacco: Not on file  . Alcohol Use: No   OB History    No data available     Review of Systems  Unable to perform ROS: Mental status change  Neurological: Positive for weakness (generalized).    Allergies  Sulfa antibiotics  Home Medications   Prior to Admission medications   Medication Sig Start Date End Date Taking? Authorizing Provider  alendronate (FOSAMAX) 70 MG tablet Take 70 mg by mouth once a week.  11/04/13   Historical Provider, MD  aspirin EC 81 MG tablet Take 1 tablet (81 mg total) by mouth daily. 06/15/14   Rande Brunt, NP  BAYER CONTOUR NEXT TEST test strip  11/12/13   Historical Provider, MD  BAYER MICROLET LANCETS lancets  09/29/13   Historical Provider, MD  carvedilol (COREG) 6.25 MG tablet Take 1 tablet (6.25 mg total) by mouth 2 (two) times daily with a meal. 10/29/13   Melton Alar, PA-C  clopidogrel (PLAVIX)  75 MG tablet Take 1 tablet (75 mg total) by mouth daily with breakfast. 01/12/14   Lysbeth Penner, FNP  escitalopram (LEXAPRO) 10 MG tablet Take 1 tablet (10 mg total) by mouth daily. 09/27/13   Lysbeth Penner, FNP  furosemide (LASIX) 40 MG tablet Take 1 tablet (40 mg total) by mouth daily. 10/29/13   Bobby Rumpf York, PA-C  hyoscyamine (LEVSIN SL) 0.125 MG SL tablet PLACE 1 TABLET UNDER TONGUE EVERY 6 HOURS AS NEEDED FOR CRAMPING    Lysbeth Penner, FNP  insulin aspart (NOVOLOG) 100 UNIT/ML injection Inject 100 Units into the skin 3 (three) times daily with meals. 15 units at breakfast, 20 units at lunch, 20 units supper Plus 1 unit for every 35mg /dL above 100 mg/dL 11/30/13   Bobby Rumpf York, PA-C  insulin detemir (LEVEMIR) 100 UNIT/ML injection Inject 14 Units into the  skin 2 (two) times daily.  11/30/13   Melton Alar, PA-C  levothyroxine (SYNTHROID, LEVOTHROID) 125 MCG tablet Take 1 tablet (125 mcg total) by mouth daily. 09/27/13   Lysbeth Penner, FNP  metoCLOPramide (REGLAN) 10 MG tablet Take 1 tablet (10 mg total) by mouth 4 (four) times daily -  before meals and at bedtime. 11/19/13   Kelvin Cellar, MD  ondansetron (ZOFRAN ODT) 8 MG disintegrating tablet Take 1 tablet (8 mg total) by mouth every 8 (eight) hours as needed for nausea or vomiting. 11/10/13   Lysbeth Penner, FNP  pantoprazole (PROTONIX) 40 MG tablet Take 40 mg by mouth daily. 11/26/11 12/31/13  Jolaine Artist, MD  rosuvastatin (CRESTOR) 40 MG tablet Take 1 tablet (40 mg total) by mouth daily. 09/27/13   Lysbeth Penner, FNP  spironolactone (ALDACTONE) 25 MG tablet Take 12.5 mg by mouth daily.    Historical Provider, MD  spironolactone (ALDACTONE) 25 MG tablet TAKE 1/2 TABLET DAILY 06/27/14   Jolaine Artist, MD  Vitamin D, Ergocalciferol, (DRISDOL) 50000 UNITS CAPS capsule Take 1 capsule by mouth once a week. On saturday 09/29/13   Historical Provider, MD   BP 114/44 mmHg  Pulse 123  Resp 32  SpO2 100% Physical Exam  Constitutional: She appears well-developed and well-nourished. She appears ill.  HENT:  Head: Normocephalic and atraumatic.  Right Ear: External ear normal.  Left Ear: External ear normal.  Nose: Nose normal.  Mouth/Throat: Mucous membranes are pale and dry.  Eyes: Pupils are equal, round, and reactive to light.  Cardiovascular: Regular rhythm.  Tachycardia present.   Pulmonary/Chest: No accessory muscle usage. Tachypnea noted. She has no decreased breath sounds.  Abdominal: She exhibits no distension.  Neurological: She is unresponsive.  Unable to follow commands; no spontaneous movement of any extremity  Skin: Skin is warm and dry.  Psychiatric:  Unable to assess    ED Course  INTUBATION Date/Time: 07/26/2014 1:38 PM Performed by: Marigene Ehlers Almedia Cordell Authorized  by: Berenice Primas Consent: The procedure was performed in an emergent situation. Indications: airway protection Intubation method: direct Patient status: paralyzed (RSI) Sedatives: etomidate Paralytic: rocuronium Laryngoscope size: Mac 3 Tube size: 7.5 mm Tube type: cuffed Number of attempts: 1 Cricoid pressure: yes Cords visualized: yes Post-procedure assessment: chest rise and ETCO2 monitor Breath sounds: equal Cuff inflated: yes ETT to lip: 22 cm Tube secured with: ETT holder Chest x-ray interpreted by me and radiologist. Chest x-ray findings: endotracheal tube in appropriate position Patient tolerance: Patient tolerated the procedure well with no immediate complications     Labs Review Labs Reviewed  CBC WITH  DIFFERENTIAL - Abnormal; Notable for the following:    WBC 16.5 (*)    RBC 3.79 (*)    Hemoglobin 11.0 (*)    HCT 34.5 (*)    Neutrophils Relative % 81 (*)    Neutro Abs 13.3 (*)    All other components within normal limits  COMPREHENSIVE METABOLIC PANEL - Abnormal; Notable for the following:    Sodium 131 (*)    Potassium 7.1 (*)    Chloride 87 (*)    CO2 <7 (*)    Glucose, Bld 1029 (*)    BUN 30 (*)    Creatinine, Ser 1.26 (*)    Calcium 11.5 (*)    Albumin 3.0 (*)    Alkaline Phosphatase 182 (*)    GFR calc non Af Amer 44 (*)    GFR calc Af Amer 51 (*)    All other components within normal limits  URINALYSIS, ROUTINE W REFLEX MICROSCOPIC - Abnormal; Notable for the following:    Glucose, UA >1000 (*)    Hgb urine dipstick MODERATE (*)    Ketones, ur >80 (*)    All other components within normal limits  I-STAT CG4 LACTIC ACID, ED - Abnormal; Notable for the following:    Lactic Acid, Venous 5.65 (*)    All other components within normal limits  I-STAT ARTERIAL BLOOD GAS, ED - Abnormal; Notable for the following:    pH, Arterial 6.982 (*)    pCO2 arterial 27.8 (*)    pO2, Arterial 486.0 (*)    Bicarbonate 6.6 (*)    Acid-base deficit 24.0 (*)     All other components within normal limits  I-STAT CHEM 8, ED - Abnormal; Notable for the following:    Sodium 130 (*)    Potassium 6.5 (*)    BUN 29 (*)    Creatinine, Ser 1.30 (*)    Glucose, Bld >700 (*)    Calcium, Ion 1.47 (*)    All other components within normal limits  CBG MONITORING, ED - Abnormal; Notable for the following:    Glucose-Capillary >600 (*)    All other components within normal limits  CBG MONITORING, ED - Abnormal; Notable for the following:    Glucose-Capillary >600 (*)    All other components within normal limits  CULTURE, BLOOD (ROUTINE X 2)  CULTURE, BLOOD (ROUTINE X 2)  URINE CULTURE  CULTURE, BLOOD (ROUTINE X 2)  CULTURE, BLOOD (ROUTINE X 2)  URINE CULTURE  CULTURE, EXPECTORATED SPUTUM-ASSESSMENT  URINE MICROSCOPIC-ADD ON  BASIC METABOLIC PANEL  BASIC METABOLIC PANEL  BASIC METABOLIC PANEL  BASIC METABOLIC PANEL  CBC  PROTIME-INR  APTT  URINALYSIS, ROUTINE W REFLEX MICROSCOPIC  LACTIC ACID, PLASMA  PRO B NATRIURETIC PEPTIDE  TSH  BLOOD GAS, ARTERIAL  TROPONIN I  TROPONIN I  TROPONIN I    Imaging Review Dg Chest Port 1 View  07/26/2014   CLINICAL DATA:  63 year old female intubated. Initial encounter.  EXAM: PORTABLE CHEST - 1 VIEW  COMPARISON:  11/10/2013 and earlier.  FINDINGS: Portable AP supine view at 1400 hrs. Endotracheal tube tip 2 cm above the carina. Resuscitation pads in place. Enteric tube courses to the left upper quadrant, side hole projects over the stomach. Lower lung volumes. Stable cardiac size and mediastinal contours. Sequelae of CABG. No pneumothorax, pulmonary edema, pleural effusion or consolidation identified.  IMPRESSION: 1. Intubated. ETT tip 2 cm above the carina. Enteric tube with side hole projecting over the stomach. 2. Lower lung volumes, no acute  cardiopulmonary abnormality.   Electronically Signed   By: Lars Pinks M.D.   On: 07/26/2014 14:20     EKG Interpretation   Date/Time:  Tuesday July 26 2014  13:20:32 EST Ventricular Rate:  117 PR Interval:  54 QRS Duration: 162 QT Interval:  451 QTC Calculation: 629 R Axis:   -75 Text Interpretation:  Sinus tachycardia Ventricular premature complex RAE,  consider biatrial enlargement Nonspecific IVCD with LAD LVH with secondary  repolarization abnormality Inferior infarct, old Anterior infarct, old  Baseline wander in lead(s) V1 V4 wide complex tachycardia new since  previous  Confirmed by YAO  MD, DAVID ([id number]) on 07/26/2014 2:05:35 PM      MDM   Final diagnoses:  Hyperglycemia  Acidosis  Diabetic ketoacidosis with coma associated with type 2 diabetes mellitus  Altered mental status, unspecified altered mental status type  Tachycardia    63 y.o. female presents with altered mental status and hyperglycemia. Found by husband who is not present. Unable to obtain further history from the patient.   Glucose "High". Presumed DKA. IV fluids started by EMS. Will continue for a total of two liters and start an insulin drip. She is critically ill, not protecting her airway given her decreased LOC. She will need to be intubated.   Wide QRS tachycardia which improved with Bicarb. She was given 3 amps of bicarb total and 2 amps calcium chloride prior to intubation. See procedure note above for intubation. CXR post intubation showed good placement of the ETT and OG tube. No opacity. Leukocytosis, mild anemia. Elevated K. Acute renal failure. Lactic acid elevated. UA negative for infection.   Bicarb drip began. NS infusion began. Insulin drip began.   Critical care consulted at 1407. Discussed her case with critical care. She will be admitted to the ICU. They assumed care of her while she was in the ED at 1438. CT head pending.   This case managed in conjunction with my attending, Dr. Darl Householder.       Berenice Primas, MD 07/26/14 Moxee Yao, MD 07/28/14 [phone]

## 2014-07-27 ENCOUNTER — Inpatient Hospital Stay (HOSPITAL_COMMUNITY): Payer: BC Managed Care – PPO

## 2014-07-27 DIAGNOSIS — R400 Somnolence: Secondary | ICD-10-CM

## 2014-07-27 DIAGNOSIS — I5023 Acute on chronic systolic (congestive) heart failure: Secondary | ICD-10-CM

## 2014-07-27 DIAGNOSIS — E0811 Diabetes mellitus due to underlying condition with ketoacidosis with coma: Secondary | ICD-10-CM

## 2014-07-27 LAB — POCT I-STAT 3, ART BLOOD GAS (G3+)
Acid-Base Excess: 3 mmol/L — ABNORMAL HIGH (ref 0.0–2.0)
BICARBONATE: 28 meq/L — AB (ref 20.0–24.0)
O2 Saturation: 99 %
PCO2 ART: 46.5 mmHg — AB (ref 35.0–45.0)
PH ART: 7.388 (ref 7.350–7.450)
PO2 ART: 170 mmHg — AB (ref 80.0–100.0)
TCO2: 29 mmol/L (ref 0–100)

## 2014-07-27 LAB — BLOOD GAS, ARTERIAL
Acid-base deficit: 6.6 mmol/L — ABNORMAL HIGH (ref 0.0–2.0)
Bicarbonate: 16 mEq/L — ABNORMAL LOW (ref 20.0–24.0)
DRAWN BY: 41881
FIO2: 0.4 %
LHR: 35 {breaths}/min
O2 Saturation: 99.1 %
PCO2 ART: 20.3 mmHg — AB (ref 35.0–45.0)
PEEP: 5 cmH2O
Patient temperature: 98.6
TCO2: 16.7 mmol/L (ref 0–100)
VT: 500 mL
pH, Arterial: 7.508 — ABNORMAL HIGH (ref 7.350–7.450)
pO2, Arterial: 174 mmHg — ABNORMAL HIGH (ref 80.0–100.0)

## 2014-07-27 LAB — BASIC METABOLIC PANEL
ANION GAP: 25 — AB (ref 5–15)
Anion gap: 24 — ABNORMAL HIGH (ref 5–15)
Anion gap: 28 — ABNORMAL HIGH (ref 5–15)
Anion gap: 31 — ABNORMAL HIGH (ref 5–15)
Anion gap: 21 — ABNORMAL HIGH (ref 5–15)
Anion gap: 16 — ABNORMAL HIGH (ref 5–15)
Anion gap: 15 (ref 5–15)
Anion gap: 16 — ABNORMAL HIGH (ref 5–15)
Anion gap: 21 — ABNORMAL HIGH (ref 5–15)
BUN: 19 mg/dL (ref 6–23)
BUN: 19 mg/dL (ref 6–23)
BUN: 17 mg/dL (ref 6–23)
BUN: 19 mg/dL (ref 6–23)
BUN: 19 mg/dL (ref 6–23)
BUN: 18 mg/dL (ref 6–23)
BUN: 15 mg/dL (ref 6–23)
BUN: 16 mg/dL (ref 6–23)
BUN: 16 mg/dL (ref 6–23)
CALCIUM: 8.6 mg/dL (ref 8.4–10.5)
CALCIUM: 8.8 mg/dL (ref 8.4–10.5)
CALCIUM: 8 mg/dL — AB (ref 8.4–10.5)
CALCIUM: 9.3 mg/dL (ref 8.4–10.5)
CALCIUM: 8 mg/dL — AB (ref 8.4–10.5)
CHLORIDE: 101 meq/L (ref 96–112)
CO2: 18 mEq/L — ABNORMAL LOW (ref 19–32)
CO2: 16 mEq/L — ABNORMAL LOW (ref 19–32)
CO2: 22 mEq/L (ref 19–32)
CO2: 26 mEq/L (ref 19–32)
CO2: 25 mEq/L (ref 19–32)
CO2: 22 meq/L (ref 19–32)
CO2: 20 meq/L (ref 19–32)
CO2: 23 mEq/L (ref 19–32)
CO2: 18 mEq/L — ABNORMAL LOW (ref 19–32)
CREATININE: 1.05 mg/dL (ref 0.50–1.10)
CREATININE: 0.91 mg/dL (ref 0.50–1.10)
CREATININE: 0.91 mg/dL (ref 0.50–1.10)
Calcium: 9.1 mg/dL (ref 8.4–10.5)
Calcium: 8.9 mg/dL (ref 8.4–10.5)
Calcium: 8.3 mg/dL — ABNORMAL LOW (ref 8.4–10.5)
Calcium: 8 mg/dL — ABNORMAL LOW (ref 8.4–10.5)
Chloride: 102 mEq/L (ref 96–112)
Chloride: 96 mEq/L (ref 96–112)
Chloride: 101 mEq/L (ref 96–112)
Chloride: 103 mEq/L (ref 96–112)
Chloride: 104 mEq/L (ref 96–112)
Chloride: 106 mEq/L (ref 96–112)
Chloride: 105 mEq/L (ref 96–112)
Chloride: 101 mEq/L (ref 96–112)
Creatinine, Ser: 0.93 mg/dL (ref 0.50–1.10)
Creatinine, Ser: 0.97 mg/dL (ref 0.50–1.10)
Creatinine, Ser: 0.95 mg/dL (ref 0.50–1.10)
Creatinine, Ser: 0.96 mg/dL (ref 0.50–1.10)
Creatinine, Ser: 1.16 mg/dL — ABNORMAL HIGH (ref 0.50–1.10)
Creatinine, Ser: 1.19 mg/dL — ABNORMAL HIGH (ref 0.50–1.10)
GFR calc Af Amer: 76 mL/min — ABNORMAL LOW (ref >90)
GFR calc Af Amer: 76 mL/min — ABNORMAL LOW (ref >90)
GFR calc Af Amer: 56 mL/min — ABNORMAL LOW (ref >90)
GFR calc Af Amer: 55 mL/min — ABNORMAL LOW (ref >90)
GFR calc non Af Amer: 65 mL/min — ABNORMAL LOW (ref >90)
GFR calc non Af Amer: 65 mL/min — ABNORMAL LOW (ref >90)
GFR calc non Af Amer: 55 mL/min — ABNORMAL LOW (ref >90)
GFR, EST AFRICAN AMERICAN: 74 mL/min — AB (ref >90)
GFR, EST AFRICAN AMERICAN: 70 mL/min — AB (ref >90)
GFR, EST AFRICAN AMERICAN: 72 mL/min — AB (ref >90)
GFR, EST AFRICAN AMERICAN: 71 mL/min — AB (ref >90)
GFR, EST AFRICAN AMERICAN: 64 mL/min — AB (ref >90)
GFR, EST NON AFRICAN AMERICAN: 64 mL/min — AB (ref >90)
GFR, EST NON AFRICAN AMERICAN: 60 mL/min — AB (ref >90)
GFR, EST NON AFRICAN AMERICAN: 62 mL/min — AB (ref >90)
GFR, EST NON AFRICAN AMERICAN: 61 mL/min — AB (ref >90)
GFR, EST NON AFRICAN AMERICAN: 49 mL/min — AB (ref >90)
GFR, EST NON AFRICAN AMERICAN: 47 mL/min — AB (ref >90)
GLUCOSE: 137 mg/dL — AB (ref 70–99)
GLUCOSE: 195 mg/dL — AB (ref 70–99)
GLUCOSE: 146 mg/dL — AB (ref 70–99)
GLUCOSE: 292 mg/dL — AB (ref 70–99)
Glucose, Bld: 343 mg/dL — ABNORMAL HIGH (ref 70–99)
Glucose, Bld: 218 mg/dL — ABNORMAL HIGH (ref 70–99)
Glucose, Bld: 148 mg/dL — ABNORMAL HIGH (ref 70–99)
Glucose, Bld: 156 mg/dL — ABNORMAL HIGH (ref 70–99)
Glucose, Bld: 193 mg/dL — ABNORMAL HIGH (ref 70–99)
POTASSIUM: 3 meq/L — AB (ref 3.7–5.3)
POTASSIUM: 3.1 meq/L — AB (ref 3.7–5.3)
POTASSIUM: 3.4 meq/L — AB (ref 3.7–5.3)
Potassium: 3.1 mEq/L — ABNORMAL LOW (ref 3.7–5.3)
Potassium: 3.4 mEq/L — ABNORMAL LOW (ref 3.7–5.3)
Potassium: 3.2 mEq/L — ABNORMAL LOW (ref 3.7–5.3)
Potassium: 3.4 mEq/L — ABNORMAL LOW (ref 3.7–5.3)
Potassium: 3.7 mEq/L (ref 3.7–5.3)
Potassium: 3.7 mEq/L (ref 3.7–5.3)
Sodium: 146 mEq/L (ref 137–147)
Sodium: 148 mEq/L — ABNORMAL HIGH (ref 137–147)
Sodium: 142 mEq/L (ref 137–147)
Sodium: 148 mEq/L — ABNORMAL HIGH (ref 137–147)
Sodium: 146 mEq/L (ref 137–147)
Sodium: 146 mEq/L (ref 137–147)
Sodium: 146 mEq/L (ref 137–147)
Sodium: 144 mEq/L (ref 137–147)
Sodium: 140 mEq/L (ref 137–147)

## 2014-07-27 LAB — CBC
HEMATOCRIT: 27.6 % — AB (ref 36.0–46.0)
HEMOGLOBIN: 9.6 g/dL — AB (ref 12.0–15.0)
MCH: 29.9 pg (ref 26.0–34.0)
MCHC: 34.8 g/dL (ref 30.0–36.0)
MCV: 86 fL (ref 78.0–100.0)
Platelets: 244 10*3/uL (ref 150–400)
RBC: 3.21 MIL/uL — ABNORMAL LOW (ref 3.87–5.11)
RDW: 13.5 % (ref 11.5–15.5)
WBC: 8 10*3/uL (ref 4.0–10.5)

## 2014-07-27 LAB — GLUCOSE, CAPILLARY
GLUCOSE-CAPILLARY: 149 mg/dL — AB (ref 70–99)
GLUCOSE-CAPILLARY: 137 mg/dL — AB (ref 70–99)
GLUCOSE-CAPILLARY: 175 mg/dL — AB (ref 70–99)
GLUCOSE-CAPILLARY: 142 mg/dL — AB (ref 70–99)
GLUCOSE-CAPILLARY: 151 mg/dL — AB (ref 70–99)
GLUCOSE-CAPILLARY: 108 mg/dL — AB (ref 70–99)
GLUCOSE-CAPILLARY: 130 mg/dL — AB (ref 70–99)
GLUCOSE-CAPILLARY: 161 mg/dL — AB (ref 70–99)
Glucose-Capillary: 132 mg/dL — ABNORMAL HIGH (ref 70–99)
Glucose-Capillary: 133 mg/dL — ABNORMAL HIGH (ref 70–99)
Glucose-Capillary: 279 mg/dL — ABNORMAL HIGH (ref 70–99)
Glucose-Capillary: 312 mg/dL — ABNORMAL HIGH (ref 70–99)
Glucose-Capillary: 165 mg/dL — ABNORMAL HIGH (ref 70–99)
Glucose-Capillary: 213 mg/dL — ABNORMAL HIGH (ref 70–99)
Glucose-Capillary: 269 mg/dL — ABNORMAL HIGH (ref 70–99)
Glucose-Capillary: 128 mg/dL — ABNORMAL HIGH (ref 70–99)
Glucose-Capillary: 121 mg/dL — ABNORMAL HIGH (ref 70–99)
Glucose-Capillary: 149 mg/dL — ABNORMAL HIGH (ref 70–99)
Glucose-Capillary: 164 mg/dL — ABNORMAL HIGH (ref 70–99)
Glucose-Capillary: 138 mg/dL — ABNORMAL HIGH (ref 70–99)
Glucose-Capillary: 186 mg/dL — ABNORMAL HIGH (ref 70–99)
Glucose-Capillary: 247 mg/dL — ABNORMAL HIGH (ref 70–99)
Glucose-Capillary: 276 mg/dL — ABNORMAL HIGH (ref 70–99)
Glucose-Capillary: 218 mg/dL — ABNORMAL HIGH (ref 70–99)

## 2014-07-27 LAB — HEPATIC FUNCTION PANEL
ALT: 7 U/L (ref 0–35)
AST: 12 U/L (ref 0–37)
Albumin: 2.4 g/dL — ABNORMAL LOW (ref 3.5–5.2)
Alkaline Phosphatase: 135 U/L — ABNORMAL HIGH (ref 39–117)
Total Bilirubin: 0.6 mg/dL (ref 0.3–1.2)
Total Protein: 5.2 g/dL — ABNORMAL LOW (ref 6.0–8.3)

## 2014-07-27 LAB — URINE CULTURE
Colony Count: NO GROWTH
Culture: NO GROWTH

## 2014-07-27 LAB — TROPONIN I: Troponin I: 0.93 ng/mL (ref <0.30)

## 2014-07-27 LAB — LACTATE DEHYDROGENASE: LDH: 135 U/L (ref 94–250)

## 2014-07-27 LAB — LACTIC ACID, PLASMA: Lactic Acid, Venous: 3.1 mmol/L — ABNORMAL HIGH (ref 0.5–2.2)

## 2014-07-27 LAB — CORTISOL: Cortisol, Plasma: 21.9 ug/dL

## 2014-07-27 LAB — PHOSPHORUS: Phosphorus: 1 mg/dL — CL (ref 2.3–4.6)

## 2014-07-27 LAB — PROCALCITONIN: Procalcitonin: 10.29 ng/mL

## 2014-07-27 LAB — MAGNESIUM: Magnesium: 1.5 mg/dL (ref 1.5–2.5)

## 2014-07-27 MED ORDER — NOREPINEPHRINE BITARTRATE 1 MG/ML IV SOLN
2.0000 ug/min | INTRAVENOUS | Status: DC
  Administered 2014-07-27: 2 ug/min via INTRAVENOUS
  Administered 2014-07-28: 5 ug/min via INTRAVENOUS
  Filled 2014-07-27: qty 16

## 2014-07-27 MED ORDER — VITAL HIGH PROTEIN PO LIQD
1000.0000 mL | ORAL | Status: DC
  Filled 2014-07-27 (×3): qty 1000

## 2014-07-27 MED ORDER — VITAL HIGH PROTEIN PO LIQD
1000.0000 mL | ORAL | Status: DC
  Filled 2014-07-27 (×2): qty 1000

## 2014-07-27 MED ORDER — SODIUM CHLORIDE 0.9 % IV BOLUS (SEPSIS)
1000.0000 mL | Freq: Once | INTRAVENOUS | Status: AC
  Administered 2014-07-27: 1000 mL via INTRAVENOUS

## 2014-07-27 MED ORDER — CLOPIDOGREL BISULFATE 75 MG PO TABS: 75.0000 mg | ORAL_TABLET | Freq: Every day | ORAL | Status: DC

## 2014-07-27 MED ORDER — DEXTROSE 5 % IV SOLN
40.0000 meq | Freq: Once | INTRAVENOUS | Status: AC
Start: 2014-07-27 — End: 2014-07-27
  Administered 2014-07-27: 40 meq via INTRAVENOUS
  Filled 2014-07-27: qty 9.09

## 2014-07-27 MED ORDER — SODIUM CHLORIDE 0.9 % IV BOLUS (SEPSIS)
500.0000 mL | Freq: Once | INTRAVENOUS | Status: AC
  Administered 2014-07-27: 500 mL via INTRAVENOUS

## 2014-07-27 MED ORDER — DEXTROSE 5 % IV SOLN
1.0000 g | Freq: Three times a day (TID) | INTRAVENOUS | Status: DC
  Administered 2014-07-27 – 2014-07-29 (×6): 1 g via INTRAVENOUS
  Filled 2014-07-27 (×9): qty 1

## 2014-07-27 MED ORDER — CLOPIDOGREL BISULFATE 75 MG PO TABS
75.0000 mg | ORAL_TABLET | Freq: Every day | ORAL | Status: DC
  Filled 2014-07-27 (×2): qty 1

## 2014-07-27 MED ORDER — HEPARIN SODIUM (PORCINE) 5000 UNIT/ML IJ SOLN
5000.0000 [IU] | Freq: Three times a day (TID) | INTRAMUSCULAR | Status: DC
  Administered 2014-07-27 – 2014-07-31 (×13): 5000 [IU] via SUBCUTANEOUS
  Filled 2014-07-27 (×20): qty 1

## 2014-07-27 MED ORDER — DEXTROSE 5 % IV SOLN
2.0000 g | Freq: Three times a day (TID) | INTRAVENOUS | Status: DC
  Filled 2014-07-27 (×3): qty 2

## 2014-07-27 MED ORDER — KCL IN DEXTROSE-NACL 40-5-0.45 MEQ/L-%-% IV SOLN
INTRAVENOUS | Status: DC
  Administered 2014-07-27 (×2): via INTRAVENOUS
  Filled 2014-07-27 (×4): qty 1000

## 2014-07-27 MED ORDER — PRO-STAT SUGAR FREE PO LIQD
30.0000 mL | Freq: Two times a day (BID) | ORAL | Status: AC
  Administered 2014-07-27 (×2): 30 mL via ORAL
  Filled 2014-07-27 (×2): qty 30

## 2014-07-27 MED ORDER — VITAL AF 1.2 CAL PO LIQD
1000.0000 mL | ORAL | Status: DC
  Administered 2014-07-27: 1000 mL
  Filled 2014-07-27 (×6): qty 1000

## 2014-07-27 MED ORDER — CLOPIDOGREL BISULFATE 75 MG PO TABS
75.0000 mg | ORAL_TABLET | Freq: Every day | ORAL | Status: DC
  Administered 2014-07-27: 75 mg via ORAL
  Filled 2014-07-27 (×3): qty 1

## 2014-07-27 MED ORDER — LEVOTHYROXINE SODIUM 125 MCG PO TABS
125.0000 ug | ORAL_TABLET | Freq: Every day | ORAL | Status: DC
  Administered 2014-07-27 – 2014-07-28 (×2): 125 ug
  Filled 2014-07-27 (×5): qty 1

## 2014-07-27 NOTE — Progress Notes (Signed)
eLink Physician-Brief Progress Note Patient Name: Bridget Martinez DOB: [birth year] MRN: [id number]   Date of Service  07/27/2014  HPI/Events of Note  hypophos hypoK+  eICU Interventions  40 mEq K phos by IVPB ordered     Intervention Category Major Interventions: Electrolyte abnormality - evaluation and management  Merton Border 07/27/2014, 3:56 AM

## 2014-07-27 NOTE — Progress Notes (Signed)
RT note-decreased RR and VT to 7cc/kg. ABG in one hour.

## 2014-07-27 NOTE — Progress Notes (Signed)
Inpatient Diabetes Program Recommendations  AACE/ADA: New Consensus Statement on Inpatient Glycemic Control (2013)  Target Ranges:  Prepandial:   less than 140 mg/dL      Peak postprandial:   less than 180 mg/dL (1-2 hours)      Critically ill patients:  140 - 180 mg/dL   Reason for Assessment:   Referral received.  Note patient admitted with severe DKA.  She is currently intubated and on the insulin drip.    Diabetes history: Type 1 diabetes.  According to notes from March, 2015 patient is seen by endocrinologist, Dr. Buddy Duty.  Outpatient Diabetes medications: Per Medication Reconciliation, Levemir 14 units bid, Novolog 15 units breakfast, 20 units with lunch and supper.  She also covers 1 units for every 35 mg/dL greater than 100 mg/dL  Current orders for Inpatient glycemic control:  Patient is currently still on insulin drip. Once acidosis is cleared, may consider Levemir 10 units bid (1st dose 2 hours prior to d/c of insulin drip).  Also consider Novolog sensitive q 4 hours, and Novolog tube feed coverage 3 units q 4 hours.  Will follow.  Thanks, Adah Perl, RN, BC-ADM Inpatient Diabetes Coordinator Pager [phone]

## 2014-07-27 NOTE — Progress Notes (Signed)
eLink Physician-Brief Progress Note Patient Name: Bridget Martinez DOB: 1950-10-06 MRN: [id number]   Date of Service  07/27/2014  HPI/Events of Note  Hypokalemia Met acidosis largely resolved Cr has normalized and Uo is > 50 cc/hr  eICU Interventions  DC bicarb gtt Change IVFs to D5 1/2 NS + 40 mEq KCl     Intervention Category Major Interventions: Electrolyte abnormality - evaluation and management  Merton Border 07/27/2014, 2:37 AM

## 2014-07-27 NOTE — Progress Notes (Signed)
Dr. Titus Mould notified of pt's decreased urine output and cvp of 9. 1 liter bolus ordered. Will continue to monitor.

## 2014-07-27 NOTE — Progress Notes (Signed)
INITIAL NUTRITION ASSESSMENT  DOCUMENTATION CODES Per approved criteria  -Not Applicable   INTERVENTION:  Initiate TF via OGT with Vital AF 1.2 at 25 ml/h and Prostat 30 ml BID on day 1; on day 2, increase to goal rate of 60 ml/h (1440 ml per day) to provide 1728 kcals, 108 gm protein, 1168 ml free water daily.  NUTRITION DIAGNOSIS: Inadequate oral intake related to inability to eat as evidenced by NPO status.   Goal: Intake to meet >90% of estimated nutrition needs.  Monitor:  TF tolerance/adequacy, weight trend, labs, vent status.  Reason for Assessment: MD Consult for TF initiation and management.  63 y.o. female  Admitting Dx: AMS  ASSESSMENT: 63 year old female presented to ED 11/17 with altered mental status. She was found to be profoundly hyperglycemic, acidemic, hyperkalemic and required intubation in ED.  Spoke with husband in room with patient, who reports that patient lost ~80 lb after CABGx4 3 years ago. She has since gained a little back. Was eating very little PTA.  Patient is currently intubated on ventilator support MV: 10.8 L/min Temp (24hrs), Avg:97.8 F (36.6 C), Min:93.9 F (34.4 C), Max:100 F (37.8 C)   Height: Ht Readings from Last 1 Encounters:  07/26/14 5\' 5"  (1.651 m)    Weight: Wt Readings from Last 1 Encounters:  07/27/14 171 lb 15.3 oz (78 kg)    Ideal Body Weight: 56.8 kg  % Ideal Body Weight: 137%  Wt Readings from Last 10 Encounters:  07/27/14 171 lb 15.3 oz (78 kg)  06/15/14 174 lb (78.926 kg)  12/02/13 160 lb 12.8 oz (72.938 kg)  11/30/13 159 lb 6.3 oz (72.3 kg)  11/18/13 157 lb 3 oz (71.3 kg)  11/10/13 165 lb (74.844 kg)  10/29/13 174 lb 9.7 oz (79.2 kg)  09/27/13 180 lb (81.647 kg)  09/21/13 178 lb 12.8 oz (81.103 kg)  09/05/13 203 lb 4.2 oz (92.2 kg)    Usual Body Weight: 160 lb (8 months ago)  % Usual Body Weight: 107%  BMI:  Body mass index is 28.62 kg/(m^2).  Estimated Nutritional Needs: Kcal:  [id number] Protein: 90-110 gm Fluid: 1.8 L  Skin: intact  Diet Order: Diet NPO time specified  EDUCATION NEEDS: -Education not appropriate at this time   Intake/Output Summary (Last 24 hours) at 07/27/14 1302 Last data filed at 07/27/14 1200  Gross per 24 hour  Intake 7946.68 ml  Output   2340 ml  Net 5606.68 ml    Last BM: 11/17   Labs:   Recent Labs Lab 07/27/14 0201  07/27/14 0605 07/27/14 0800 07/27/14 1000  NA 148*  < > 148* 146 146  K 3.4*  < > 3.1* 3.2* 3.4*  CL 101  < > 101 103 104  CO2 22  < > 16* 22 26  BUN 19  < > 19 18 17   CREATININE 0.91  < > 0.97 0.95 0.96  CALCIUM 9.1  < > 8.6 8.8 8.3*  MG 1.5  --   --   --   --   PHOS 1.0*  --   --   --   --   GLUCOSE 195*  < > 218* 148* 156*  < > = values in this interval not displayed.  CBG (last 3)   Recent Labs  07/27/14 0927 07/27/14 1009 07/27/14 1116  GLUCAP 142* 151* 121*    Scheduled Meds: . antiseptic oral rinse  7 mL Mouth Rinse QID  . cefTAZidime (FORTAZ)  IV  1 g Intravenous 3 times per day  . chlorhexidine  15 mL Mouth Rinse BID  . clopidogrel  75 mg Oral Q breakfast  . feeding supplement (VITAL HIGH PROTEIN)  1,000 mL Per Tube Q24H  . heparin subcutaneous  5,000 Units Subcutaneous 3 times per day  . levothyroxine  125 mcg Per Tube QAC breakfast  . pantoprazole (PROTONIX) IV  40 mg Intravenous Q24H  . sodium chloride  1,000 mL Intravenous Once  . vancomycin  750 mg Intravenous Q12H    Continuous Infusions: . dextrose 5 % and 0.45 % NaCl with KCl 40 mEq/L 75 mL/hr at 07/27/14 0312  . fentaNYL infusion INTRAVENOUS 25 mcg/hr (07/27/14 1227)  . insulin (NOVOLIN-R) infusion 0.5 Units/hr (07/27/14 1225)  . midazolam (VERSED) infusion    . norepinephrine (LEVOPHED) Adult infusion 3 mcg/min (07/27/14 1215)    Past Medical History  Diagnosis Date  . Diabetes mellitus     on insulin, with h/o DKA   . HTN (hypertension)   . Hyperlipidemia   . Venous stasis ulcers   . Arthritis   .  Depression     anxiety  . 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 Hypertension   . Coronary artery disease     angina.  MI.   . Pneumonia        . GERD (gastroesophageal reflux disease)     Hiatal hernia.   . Hypothyroidism   . CHF (congestive heart failure)   . Tuberculosis   . Osteoporosis     Past Surgical History  Procedure Laterality Date  . Cardiac catheterization    . Tonsillectomy    . Coronary artery bypass graft      Dr. Prescott Gum in 09/2011   . Coronary artery bypass graft  2012  . Esophagogastroduodenoscopy N/A 11/17/2013    Procedure: ESOPHAGOGASTRODUODENOSCOPY (EGD);  Surgeon: Lafayette Dragon, MD;  Location: [facility] ENDOSCOPY;  Service: Endoscopy;  Laterality: N/A;  . Colonoscopy N/A 11/18/2013    Procedure: COLONOSCOPY;  Surgeon: Lafayette Dragon, MD;  Location: [facility] ENDOSCOPY;  Service: Endoscopy;  Laterality: N/A;     Molli Barrows, RD, LDN, [place] Pager [phone] After Hours Pager [phone]

## 2014-07-27 NOTE — Progress Notes (Signed)
RT note-did not tolerate SBT 5/5, VT low, increased PS to +10.

## 2014-07-27 NOTE — Progress Notes (Signed)
PULMONARY / CRITICAL CARE MEDICINE   Name: Bridget Martinez MRN: [id number] DOB: 10-30-1950    ADMISSION DATE:  07/26/2014 CONSULTATION DATE:  07/26/2014  REFERRING MD:  EDP  CHIEF COMPLAINT:  AMS  INITIAL PRESENTATION: 63 year old female presented to ED 11/17 with altered mental status after husband notified EMS. She was found the be profoundly hyperglycemic, acidemic, hyperkalemic and required intubation in ED. PCCM has been asked to see for admission.   STUDIES:  CT head 11/17 >>> no acute, stable left frontal extra-axial calcification CXR 11/17 >>> Left lung base opacity likely atelectasis  SIGNIFICANT EVENTS: 11/17 Intubated admitted to ICU, CT head negative  SUBJECTIVE:  11/18 RN reports hypokalemia overnight.  AG still 31  VITAL SIGNS: Temp:  [93.9 F (34.4 C)-100 F (37.8 C)] 98.5 F (36.9 C) (11/18 0700) Pulse Rate:  [59-131] 65 (11/18 0700) Resp:  [14-38] 27 (11/18 0700) BP: (90-136)/(34-77) 123/44 mmHg (11/18 0700) SpO2:  [100 %] 100 % (11/18 0700) Arterial Line BP: (72-156)/(30-105) 121/105 mmHg (11/18 0700) FiO2 (%):  [40 %-100 %] 40 % (11/18 0700) Weight:  [75 kg (165 lb 5.5 oz)-81.647 kg (180 lb)] 78 kg (171 lb 15.3 oz) (11/18 0500) HEMODYNAMICS: CVP:  [2 mmHg-9 mmHg] 9 mmHg VENTILATOR SETTINGS: Vent Mode:  [-] PRVC FiO2 (%):  [40 %-100 %] 40 % Set Rate:  [20 bmp-35 bmp] 20 bmp Vt Set:  [500 mL] 500 mL PEEP:  [5 cmH20] 5 cmH20 Plateau Pressure:  [16 cmH20-18 cmH20] 18 cmH20 INTAKE / OUTPUT:  Intake/Output Summary (Last 24 hours) at 07/27/14 5038 Last data filed at 07/27/14 0600  Gross per 24 hour  Intake 7750.98 ml  Output   2275 ml  Net 5475.98 ml    PHYSICAL EXAMINATION: General:  Elderly female, appears older than stated age, intubated on vent Neuro:  Sedated on vent, RASS -1 HEENT:  [place]/AT, No JVD noted Cardiovascular: RRR, no mrg Lungs:  CTAB anteriorly Abdomen:  Soft, nontender, non-distended, +BS Musculoskeletal:  No acute deformity,  2+ PT pulses bilat Skin: Intact, no LE edema noted.   LABS:  CBC  Recent Labs Lab 07/26/14 1329 07/26/14 1340 07/27/14 0201  WBC 16.5*  --  8.0  HGB 11.0* 12.9 9.6*  HCT 34.5* 38.0 27.6*  PLT 326  --  244   Coag's  Recent Labs Lab 07/26/14 1800  APTT 23*  INR 1.15   BMET  Recent Labs Lab 07/27/14 0201 07/27/14 0400 07/27/14 0605  NA 148* 142 148*  K 3.4* 3.0* 3.1*  CL 101 96 101  CO2 22 18* 16*  BUN 19 19 19   CREATININE 0.91 0.93 0.97  GLUCOSE 195* 343* 218*   Electrolytes  Recent Labs Lab 07/27/14 0201 07/27/14 0400 07/27/14 0605  CALCIUM 9.1 8.9 8.6  MG 1.5  --   --   PHOS 1.0*  --   --    Sepsis Markers  Recent Labs Lab 07/26/14 1340 07/26/14 1800 07/26/14 2136 07/27/14 0400  LATICACIDVEN 5.65* 7.5*  --   --   PROCALCITON  --   --  9.40 10.29   ABG  Recent Labs Lab 07/26/14 1402 07/26/14 1655 07/27/14 0546  PHART 6.982* 7.191* 7.508*  PCO2ART 27.8* 21.0* 20.3*  PO2ART 486.0* 184.0* 174.0*   Liver Enzymes  Recent Labs Lab 07/26/14 1329  AST 14  ALT 9  ALKPHOS 182*  BILITOT 0.4  ALBUMIN 3.0*   Cardiac Enzymes  Recent Labs Lab 07/26/14 1800 07/26/14 2053 07/27/14 0201  TROPONINI 0.69* 1.08*  0.93*  PROBNP 2156.0*  --   --    Glucose  Recent Labs Lab 07/27/14 0311 07/27/14 0405 07/27/14 0500 07/27/14 0603 07/27/14 0700 07/27/14 0804  GLUCAP 279* 312* 269* 213* 165* 128*    Imaging Ct Head Wo Contrast  07/26/2014   CLINICAL DATA:  Altered mental status, hyperglycemia  EXAM: CT HEAD WITHOUT CONTRAST  TECHNIQUE: Contiguous axial images were obtained from the base of the skull through the vertex without intravenous contrast.  COMPARISON:  10/25/2013  FINDINGS: Mild atrophy.  Normal ventricular morphology.  No midline shift or mass effect.  Stable calcification adjacent to inner table of LEFT frontal bone since earlier study of 05/26/2007.  No intracranial hemorrhage, mass lesion or evidence acute infarction.  No  extra-axial fluid collections.  Atherosclerotic calcifications at the carotid siphons bilaterally.  Bones and sinuses otherwise unremarkable.  IMPRESSION: No acute intracranial abnormalities.  Stable LEFT frontal extra-axial calcification since 2008.   Electronically Signed   By: Lavonia Dana M.D.   On: 07/26/2014 16:49   Dg Chest Portable 1 View  07/26/2014   CLINICAL DATA:  63 year old female central line placement. Unresponsive. Initial encounter.  EXAM: PORTABLE CHEST - 1 VIEW  COMPARISON:  1400 hr the same day and earlier.  FINDINGS: Portable AP supine view at 1518 hrs. Endotracheal tube tip now at the level the clavicles. New left IJ approach central venous catheter, tip projects one vertebral body below the carina. Enteric tube courses to the left upper quadrant as before.  Stable cardiac size and mediastinal contours. Sequelae of CABG. Improved lung volumes. Except for streaky opacity at the left lung base the lungs appear clear. No pneumothorax.  IMPRESSION: 1. Left IJ central line placed, tip at the lower SVC level. No pneumothorax. 2. ETT tip now at the clavicles.  Stable enteric tube. 3. Streaky left lung base opacity, most resembles atelectasis.   Electronically Signed   By: Lars Pinks M.D.   On: 07/26/2014 15:43   Dg Chest Port 1 View  07/26/2014   CLINICAL DATA:  63 year old female intubated. Initial encounter.  EXAM: PORTABLE CHEST - 1 VIEW  COMPARISON:  11/10/2013 and earlier.  FINDINGS: Portable AP supine view at 1400 hrs. Endotracheal tube tip 2 cm above the carina. Resuscitation pads in place. Enteric tube courses to the left upper quadrant, side hole projects over the stomach. Lower lung volumes. Stable cardiac size and mediastinal contours. Sequelae of CABG. No pneumothorax, pulmonary edema, pleural effusion or consolidation identified.  IMPRESSION: 1. Intubated. ETT tip 2 cm above the carina. Enteric tube with side hole projecting over the stomach. 2. Lower lung volumes, no acute  cardiopulmonary abnormality.   Electronically Signed   By: Lars Pinks M.D.   On: 07/26/2014 14:20     ASSESSMENT / PLAN:  PULMONARY OETT 11/17 A: Acute respiratory failure 2nd to AMS  -11/18 ABG pH 7.5 pCO2 20.3, overventilating  P:   Full vent support. Daily CXR for ett VAP prevention per protocol Follow ABGs noted, reduce rate 10, she does not breath over, abg to follow SBT attempt likely after abg review   CARDIOVASCULAR A:  New LBBB resolved after K treatment H/o HTN Systolic and Diastolic CHF: EF 02-72%, grade 1 dias dys (old) CVP 9 Troponin leak - trending down, demand ischemia  P:  Tele Scheduled metoprolol Hold PO coreg, lasix, statin, aldactone plavix maintain levophed just started, to map 60 cvp 9 , bolus Cortisol level  RENAL A:   High AG metabolic acidosis (  31)- lactic, ketoacidosis Met alk (got bicarb), resp alk Hyperkalemia AKI Hypernatremia  P:   IVF hydration per DKA protocol Serial Bmet q 2 hours Bicarb gtt off, keep it off Need repeat lactic NOW k supp  GASTROINTESTINAL A:   GERD Lactic acidosis P:   Start T F SUP: IV protonix Ldh, repeat LFT If lactic rises = CT abdo/pelvis consideration  HEMATOLOGIC A:   DVt prevention P:  Restart plavix DVT: heparin sq/SCDs Follow CBC  INFECTIOUS A:   Concern for infectious process, elevated lactic, PCT 9.4>10.29, WBC 8.0. Unclear source, lactic rise  P:   BCx2 11/17 >> UC 11/17 >>> Sputum 11/17 >> Abx: Zosyn, start date 11/17>>>11/18 ceftaz 11/18>>> Abx: Vancomycin, start date 11/17>>>  Concern pct rise, shock new CT abdo/pelvis, chest STAT, if neg , consider LP For now change to ceftaz BBB coverage, dc zosyn  ENDOCRINE A:   Diabetic ketoacidosis, severe DM Hypothyroid  P:   Initiate DKA protocol IVF, Insulin gtt, and K replacement per protocol TSH Continue synthroid Diabetes coordinator consult placed, frequent DKAs. Cortisol level  NEUROLOGIC A:   Acute  encephalopathy  P:   RASS goal: -1 Fentanyl gtt for analgesia, wua May need LP, see ID PAD protocol  FAMILY  - Updates: Husband 11/17, 66  - Inter-disciplinary family meet or Palliative Care meeting due by:  11/24  TODAY'S SUMMARY: Continued lactic acidosis and AG 31, continue insulin gtt.   Tawanna Sat, MD 07/27/2014, 8:27 AM PGY-2, [facility]   STAFF NOTE: I, Merrie Roof, MD FACP have personally reviewed patient's available data, including medical history, events of note, physical examination and test results as part of my evaluation. I have discussed with resident/NP and other care providers such as pharmacist, RN and RRT. In addition, I personally evaluated patient and elicited key findings of: stil not awake, lactic rise, CTA lungs, change top ceftaz, get CT abdo/pelvis, pct rise, continued vanc, reduce MV vent, dc bicarb The patient is critically ill with multiple organ systems failure and requires high complexity decision making for assessment and support, frequent evaluation and titration of therapies, application of advanced monitoring technologies and extensive interpretation of multiple databases.   Critical Care Time devoted to patient care services described in this note is30 Minutes. This time reflects time of care of this signee: Merrie Roof, MD FACP. This critical care time does not reflect procedure time, or teaching time or supervisory time of PA/NP/Med student/Med Resident etc but could involve care discussion time. Rest per NP/medical resident whose note is outlined above and that I agree with   [name]. Titus Mould, MD, [place] Pgr: [facility] 07/27/2014 9:04 AM

## 2014-07-27 NOTE — Progress Notes (Signed)
[facility] Progress Note Patient Name: Bridget Martinez DOB: 1951-08-15 MRN: [id number]   Date of Service  07/27/2014  HPI/Events of Note    eICU Interventions  Vent changes made     Intervention Category Major Interventions: Acid-Base disturbance - evaluation and management;Respiratory failure - evaluation and management  Merton Border 07/27/2014, 6:14 AM

## 2014-07-27 NOTE — Progress Notes (Signed)
CRITICAL VALUE ALERT  Critical value received:  Phosphorus 1.0  Date of notification:  07/27/2014  Time of notification:  0250  Critical value read back:Yes.    Nurse who received alert:  Charmayne Sheer  MD notified (1st page):  Simonds  Time of first page:  0350  MD notified (2nd page):  Time of second page:  Responding MD:  Alva Garnet  Time MD responded:  [phone]

## 2014-07-28 ENCOUNTER — Inpatient Hospital Stay (HOSPITAL_COMMUNITY): Payer: BC Managed Care – PPO

## 2014-07-28 LAB — BASIC METABOLIC PANEL
Anion gap: 17 — ABNORMAL HIGH (ref 5–15)
Anion gap: 18 — ABNORMAL HIGH (ref 5–15)
Anion gap: 15 (ref 5–15)
BUN: 15 mg/dL (ref 6–23)
BUN: 15 mg/dL (ref 6–23)
BUN: 16 mg/dL (ref 6–23)
CHLORIDE: 106 meq/L (ref 96–112)
CO2: 20 meq/L (ref 19–32)
CO2: 21 meq/L (ref 19–32)
CO2: 23 meq/L (ref 19–32)
Calcium: 8 mg/dL — ABNORMAL LOW (ref 8.4–10.5)
Calcium: 8 mg/dL — ABNORMAL LOW (ref 8.4–10.5)
Calcium: 8.1 mg/dL — ABNORMAL LOW (ref 8.4–10.5)
Chloride: 103 mEq/L (ref 96–112)
Chloride: 103 mEq/L (ref 96–112)
Creatinine, Ser: 1.25 mg/dL — ABNORMAL HIGH (ref 0.50–1.10)
Creatinine, Ser: 1.3 mg/dL — ABNORMAL HIGH (ref 0.50–1.10)
Creatinine, Ser: 1.25 mg/dL — ABNORMAL HIGH (ref 0.50–1.10)
GFR calc Af Amer: 52 mL/min — ABNORMAL LOW (ref >90)
GFR calc Af Amer: 49 mL/min — ABNORMAL LOW (ref >90)
GFR calc Af Amer: 52 mL/min — ABNORMAL LOW (ref >90)
GFR, EST NON AFRICAN AMERICAN: 42 mL/min — AB (ref >90)
GFR, EST NON AFRICAN AMERICAN: 44 mL/min — AB (ref >90)
GFR, EST NON AFRICAN AMERICAN: 44 mL/min — AB (ref >90)
GLUCOSE: 206 mg/dL — AB (ref 70–99)
Glucose, Bld: 151 mg/dL — ABNORMAL HIGH (ref 70–99)
Glucose, Bld: 176 mg/dL — ABNORMAL HIGH (ref 70–99)
POTASSIUM: 3.6 meq/L — AB (ref 3.7–5.3)
POTASSIUM: 3.9 meq/L (ref 3.7–5.3)
Potassium: 3.7 mEq/L (ref 3.7–5.3)
Sodium: 141 mEq/L (ref 137–147)
Sodium: 141 mEq/L (ref 137–147)
Sodium: 144 mEq/L (ref 137–147)

## 2014-07-28 LAB — CBC
HEMATOCRIT: 30.1 % — AB (ref 36.0–46.0)
HEMOGLOBIN: 9.7 g/dL — AB (ref 12.0–15.0)
MCH: 28.6 pg (ref 26.0–34.0)
MCHC: 32.2 g/dL (ref 30.0–36.0)
MCV: 88.8 fL (ref 78.0–100.0)
Platelets: 237 10*3/uL (ref 150–400)
RBC: 3.39 MIL/uL — ABNORMAL LOW (ref 3.87–5.11)
RDW: 14.6 % (ref 11.5–15.5)
WBC: 10.6 10*3/uL — ABNORMAL HIGH (ref 4.0–10.5)

## 2014-07-28 LAB — GLUCOSE, CAPILLARY
GLUCOSE-CAPILLARY: 122 mg/dL — AB (ref 70–99)
GLUCOSE-CAPILLARY: 144 mg/dL — AB (ref 70–99)
Glucose-Capillary: 137 mg/dL — ABNORMAL HIGH (ref 70–99)
Glucose-Capillary: 175 mg/dL — ABNORMAL HIGH (ref 70–99)
Glucose-Capillary: 232 mg/dL — ABNORMAL HIGH (ref 70–99)
Glucose-Capillary: 178 mg/dL — ABNORMAL HIGH (ref 70–99)
Glucose-Capillary: 172 mg/dL — ABNORMAL HIGH (ref 70–99)
Glucose-Capillary: 145 mg/dL — ABNORMAL HIGH (ref 70–99)
Glucose-Capillary: 135 mg/dL — ABNORMAL HIGH (ref 70–99)
Glucose-Capillary: 175 mg/dL — ABNORMAL HIGH (ref 70–99)
Glucose-Capillary: 198 mg/dL — ABNORMAL HIGH (ref 70–99)
Glucose-Capillary: 191 mg/dL — ABNORMAL HIGH (ref 70–99)
Glucose-Capillary: 122 mg/dL — ABNORMAL HIGH (ref 70–99)
Glucose-Capillary: 128 mg/dL — ABNORMAL HIGH (ref 70–99)
Glucose-Capillary: 105 mg/dL — ABNORMAL HIGH (ref 70–99)
Glucose-Capillary: 113 mg/dL — ABNORMAL HIGH (ref 70–99)
Glucose-Capillary: 116 mg/dL — ABNORMAL HIGH (ref 70–99)

## 2014-07-28 LAB — PROCALCITONIN: Procalcitonin: 8.69 ng/mL

## 2014-07-28 MED ORDER — CLOPIDOGREL BISULFATE 75 MG PO TABS
75.0000 mg | ORAL_TABLET | Freq: Every day | ORAL | Status: DC
  Administered 2014-07-28 – 2014-07-29 (×2): 75 mg
  Filled 2014-07-28 (×3): qty 1

## 2014-07-28 MED ORDER — CHLORHEXIDINE GLUCONATE 0.12 % MT SOLN
15.0000 mL | Freq: Two times a day (BID) | OROMUCOSAL | Status: DC
  Administered 2014-07-28 – 2014-07-29 (×2): 15 mL via OROMUCOSAL
  Filled 2014-07-28 (×2): qty 15

## 2014-07-28 MED ORDER — INSULIN GLARGINE 100 UNIT/ML ~~LOC~~ SOLN
10.0000 [IU] | Freq: Every day | SUBCUTANEOUS | Status: DC
  Filled 2014-07-28: qty 0.1

## 2014-07-28 MED ORDER — INSULIN ASPART 100 UNIT/ML ~~LOC~~ SOLN: 2.0000 [IU] | SUBCUTANEOUS | Status: DC

## 2014-07-28 MED ORDER — DEXTROSE 10 % IV SOLN: INTRAVENOUS | Status: DC | PRN

## 2014-07-28 MED ORDER — CETYLPYRIDINIUM CHLORIDE 0.05 % MT LIQD: 7.0000 mL | Freq: Two times a day (BID) | OROMUCOSAL | Status: DC

## 2014-07-28 MED ORDER — SODIUM CHLORIDE 0.9 % IV SOLN: INTRAVENOUS | Status: DC | PRN

## 2014-07-28 MED ORDER — SODIUM CHLORIDE 0.9 % IV BOLUS (SEPSIS)
500.0000 mL | Freq: Once | INTRAVENOUS | Status: AC
  Administered 2014-07-28: 500 mL via INTRAVENOUS

## 2014-07-28 MED ORDER — INSULIN GLARGINE 100 UNIT/ML ~~LOC~~ SOLN
20.0000 [IU] | Freq: Every day | SUBCUTANEOUS | Status: DC
Start: 2014-07-28 — End: 2014-07-29
  Administered 2014-07-28 – 2014-07-29 (×2): 20 [IU] via SUBCUTANEOUS
  Filled 2014-07-28 (×3): qty 0.2

## 2014-07-28 NOTE — Progress Notes (Signed)
Wasted 225 cc of Fentanyl in sink with Herma Carson, RN.

## 2014-07-28 NOTE — Progress Notes (Signed)
Pharmacist Heart Failure Core Measure Documentation  Assessment: Bridget Martinez has an EF documented as 30-35% on 07/26/14.  Rationale: Heart failure patients with left ventricular systolic dysfunction (LVSD) and an EF < 40% should be prescribed an angiotensin converting enzyme inhibitor (ACEI) or angiotensin receptor blocker (ARB) at discharge unless a contraindication is documented in the medical record.  This patient is not currently on an ACEI or ARB for HF.  This note is being placed in the record in order to provide documentation that a contraindication to the use of these agents is present for this encounter.  ACE Inhibitor or Angiotensin Receptor Blocker is contraindicated (specify all that apply)  []   ACEI allergy AND ARB allergy []   Angioedema []   Moderate or severe aortic stenosis []   Hyperkalemia [x]   Hypotension []   Renal artery stenosis []   Worsening renal function, preexisting renal disease or dysfunction  Thanks,   Theron Arista, PharmD Clinical Pharmacist - Resident Pager: [phone] 11/19/201512:23 PM

## 2014-07-28 NOTE — Progress Notes (Signed)
PULMONARY / CRITICAL CARE MEDICINE   Name: Bridget Martinez MRN: [id number] DOB: 04-14-1951    ADMISSION DATE:  07/26/2014 CONSULTATION DATE:  07/26/2014  REFERRING MD:  EDP  CHIEF COMPLAINT:  AMS  INITIAL PRESENTATION: 63 year old female presented to ED 11/17 with altered mental status after husband notified EMS. She was found the be profoundly hyperglycemic, acidemic, hyperkalemic and required intubation in ED. PCCM has been asked to see for admission.   STUDIES:  CT head 11/17 >>> no acute, stable left frontal extra-axial calcification CXR 11/17 >>> Left lung base opacity likely atelectasis CXR 11/19 >>> increased bibasilar opacities  SIGNIFICANT EVENTS: 11/17 Intubated admitted to ICU, CT head negative 11/18 continue insulin gtt/AG 31, lactate decreasing CT dc'ed 11/19- improved BP  SUBJECTIVE:  11/19 RN reports no acute issues, insulin drip low, requiring increased levophed for low diastolic. Also reports discussion with daughter that patient often gets ill around holidays this time of year, thinks depression plays a role as she has lost family members around this time.  VITAL SIGNS: Temp:  [98.4 F (36.9 C)-99.2 F (37.3 C)] 99.1 F (37.3 C) (11/19 0630) Pulse Rate:  [67-91] 85 (11/19 0630) Resp:  [8-44] 18 (11/19 0630) BP: (96-148)/(30-55) 123/45 mmHg (11/19 0630) SpO2:  [99 %-100 %] 100 % (11/19 0630) Arterial Line BP: (91-140)/(40-95) 109/70 mmHg (11/18 1600) FiO2 (%):  [40 %] 40 % (11/19 0600) Weight:  [83.5 kg (184 lb 1.4 oz)] 83.5 kg (184 lb 1.4 oz) (11/19 0500) HEMODYNAMICS: CVP:  [5 mmHg-9 mmHg] 5 mmHg VENTILATOR SETTINGS: Vent Mode:  [-] PRVC FiO2 (%):  [40 %] 40 % Set Rate:  [10 bmp-20 bmp] 14 bmp Vt Set:  [400 mL-500 mL] 400 mL PEEP:  [5 cmH20] 5 cmH20 Pressure Support:  [10 cmH20] 10 cmH20 Plateau Pressure:  [11 cmH20-16 cmH20] 16 cmH20 INTAKE / OUTPUT:  Intake/Output Summary (Last 24 hours) at 07/28/14 0748 Last data filed at 07/28/14 3794  Gross  per 24 hour  Intake 2629.8 ml  Output    900 ml  Net 1729.8 ml    PHYSICAL EXAMINATION: General:  Elderly female, appears older than stated age, intubated on vent Neuro:  Sedated on vent, RASS 0, follows commands Cardiovascular: RRR, no mrg Lungs:  Mild crackles bibasilar, clear upper lobes Abdomen:  Soft, nontender, non-distended, +BS Musculoskeletal:  No acute deformity, 2+ PT pulses bilat Skin: Intact, no LE edema noted.   LABS:  CBC  Recent Labs Lab 07/26/14 1329 07/26/14 1340 07/27/14 0201 07/28/14 0405  WBC 16.5*  --  8.0 10.6*  HGB 11.0* 12.9 9.6* 9.7*  HCT 34.5* 38.0 27.6* 30.1*  PLT 326  --  244 237   Coag's  Recent Labs Lab 07/26/14 1800  APTT 23*  INR 1.15   BMET  Recent Labs Lab 07/27/14 2005 07/28/14 07/28/14 0405  NA 140 141 141  K 3.4* 3.7 3.6*  CL 101 103 103  CO2 18* 21 20  BUN 16 15 16   CREATININE 1.19* 1.25* 1.30*  GLUCOSE 292* 151* 176*   Electrolytes  Recent Labs Lab 07/27/14 0201  07/27/14 2005 07/28/14 07/28/14 0405  CALCIUM 9.1  < > 8.0* 8.0* 8.0*  MG 1.5  --   --   --   --   PHOS 1.0*  --   --   --   --   < > = values in this interval not displayed. Sepsis Markers  Recent Labs Lab 07/26/14 1340 07/26/14 1800 07/26/14 2136 07/27/14 0400 07/27/14  1000 07/28/14 0405  LATICACIDVEN 5.65* 7.5*  --   --  3.1*  --   PROCALCITON  --   --  9.40 10.29  --  8.69   ABG  Recent Labs Lab 07/26/14 1655 07/27/14 0546 07/27/14 0959  PHART 7.191* 7.508* 7.388  PCO2ART 21.0* 20.3* 46.5*  PO2ART 184.0* 174.0* 170.0*   Liver Enzymes  Recent Labs Lab 07/26/14 1329 07/27/14 1000  AST 14 12  ALT 9 7  ALKPHOS 182* 135*  BILITOT 0.4 0.6  ALBUMIN 3.0* 2.4*   Cardiac Enzymes  Recent Labs Lab 07/26/14 1800 07/26/14 2053 07/27/14 0201  TROPONINI 0.69* 1.08* 0.93*  PROBNP 2156.0*  --   --    Glucose  Recent Labs Lab 07/28/14 0101 07/28/14 0205 07/28/14 0258 07/28/14 0356 07/28/14 0504 07/28/14 0605  GLUCAP  175* 232* 178* 172* 145* 135*    Imaging Dg Chest Port 1 View  07/27/2014   CLINICAL DATA:  63 year old female with respiratory failure. Intubated. Initial encounter.  EXAM: PORTABLE CHEST - 1 VIEW  COMPARISON:  07/26/2014 and earlier.  FINDINGS: Portable AP semi upright view at 0513 hr. Stable endotracheal tube tip at the level the clavicles. Enteric tube courses to the left abdomen as before. Stable left IJ central line. Normal cardiac size and mediastinal contours. Sequelae of CABG. Interval regressed streaky left lung base opacity. Allowing for portable technique, the lungs are clear.  IMPRESSION: 1.  Stable lines and tubes. 2. Interval regressed atelectasis. No acute cardiopulmonary abnormality.   Electronically Signed   By: Lars Pinks M.D.   On: 07/27/2014 06:40   Dg Abd Portable 1v  07/27/2014   CLINICAL DATA:  Encounter for enteric feeding tube placement.  EXAM: PORTABLE ABDOMEN - 1 VIEW  COMPARISON:  None.  FINDINGS: Enteric tube passes below the diaphragm. Tip projects in the mid to distal stomach.  Normal bowel gas pattern.  IMPRESSION: Enteric tube tip lies in the mid to distal stomach.   Electronically Signed   By: Lajean Manes M.D.   On: 07/27/2014 11:42     ASSESSMENT / PLAN:  PULMONARY OETT 11/17 A: Acute respiratory failure 2nd to AMS P:   Full vent support. Daily CXR for ett VAP prevention per protocol SBT attempt again today, cpap 5 ps 5, goal 30 min, assess rsbi Repeat pcxr in am as this am with expiration  CARDIOVASCULAR A:  H/o HTN Systolic and Diastolic CHF: EF 60-63%, grade 1 dias dys (old) CVP 8 Troponin leak - demand ischemia P:  Scheduled metoprolol on hold until of off pressors Hold PO coreg, lasix, statin, aldactone plavix Levophed, change parameters to include MAP>60 or SBP >016 as diastolic low in 01U Additional NS bolus with low diastolic  RENAL A:   High AG metabolic acidosis (31)- lactic, ketoacidosis; improved but not closed, 16 Met alk  (got bicarb), resp alk Hyperkalemia AKI, scr slightly uptrending Hypernatremia P:   IVF hydration per DKA protocol Bolus Chem in am   GASTROINTESTINAL A:   GERD Lactic acidosis, improving P:   Start TF SUP: IV protonix Reassuring abdo exam  HEMATOLOGIC A:   DVt prevention P:  Restart plavix DVT: heparin sq/SCDs Follow CBC  INFECTIOUS A:   Concern for infectious process, elevated lactic, PCT 9.4>10.29>8.69, WBC 8.0>10.6, no fevers. Unclear source, lactate and PCT trending down. Mental status improving so no LP P:   BCx2 11/17 >> UC 11/17 >>> neg Sputum 11/17 >> re-culture for better growth Abx: Zosyn, start date 11/17>>>11/18 Ceftaz 11/18>>>(nosocomial  exposure) Abx: Vancomycin, start date 11/17>>>11/19  I am clinicaly impressed that she had a sepsis syndrome - will treat empiric 7 days Will  Need CT abdo pre dc  ENDOCRINE A:   Diabetic ketoacidosis, severe DM Hypothyroid  P:   Initiate DKA protocol, dc as gap likely residual from lactic acid IVF, Insulin gtt, and K replacement per protocol, ?transition to crit illness insulin Continue synthroid Diabetes coordinator consult placed, frequent DKAs.  NEUROLOGIC A:   Acute encephalopathy P:   RASS goal: 0 Fentanyl gtt for analgesia, wua PAD protocol  FAMILY  - Updates: Husband 11/17, 19th  - Inter-disciplinary family meet or Palliative Care meeting due by:  11/24   Tawanna Sat, MD 07/28/2014, 7:48 AM PGY-2, [facility]  STAFF NOTE: I, Merrie Roof, MD FACP have personally reviewed patient's available data, including medical history, events of note, physical examination and test results as part of my evaluation. I have discussed with resident/NP and other care providers such as pharmacist, RN and RRT. In addition, I personally evaluated patient and elicited key findings of:  Improved neuro fxn, weaning well, abdo good will treat abx, nees CT pre dc, chem to am . The patient is  critically ill with multiple organ systems failure and requires high complexity decision making for assessment and support, frequent evaluation and titration of therapies, application of advanced monitoring technologies and extensive interpretation of multiple databases.   Critical Care Time devoted to patient care services described in this note is 30 Minutes. This time reflects time of care of this signee: Merrie Roof, MD FACP. This critical care time does not reflect procedure time, or teaching time or supervisory time of PA/NP/Med student/Med Resident etc but could involve care discussion time. Rest per NP/medical resident whose note is outlined above and that I agree with   [name]. Titus Mould, MD, [place] Pgr: [facility] 07/28/2014 9:31 AM

## 2014-07-28 NOTE — Procedures (Signed)
Extubation Procedure Note  Patient Details:   Name: DASIYA HELDER DOB: 10/24/1950 MRN: [id number]   Airway Documentation:     Evaluation  O2 sats: stable throughout and currently acceptable Complications: No apparent complications Patient did tolerate procedure well. Bilateral Breath Sounds: Clear, Diminished Suctioning: Airway Yes   Prior to extubation: pt suctioned orally, via ETT and subglottic tubes, Positive cuff leak noted.  Post-extubation:  Pt able to cough and produce sputum, state her name, no stridor noted.  Miquel Dunn 07/28/2014, 10:16 AM

## 2014-07-29 ENCOUNTER — Inpatient Hospital Stay (HOSPITAL_COMMUNITY): Payer: BC Managed Care – PPO

## 2014-07-29 LAB — GLUCOSE, CAPILLARY
GLUCOSE-CAPILLARY: 86 mg/dL (ref 70–99)
GLUCOSE-CAPILLARY: 130 mg/dL — AB (ref 70–99)
Glucose-Capillary: 116 mg/dL — ABNORMAL HIGH (ref 70–99)
Glucose-Capillary: 204 mg/dL — ABNORMAL HIGH (ref 70–99)
Glucose-Capillary: 206 mg/dL — ABNORMAL HIGH (ref 70–99)
Glucose-Capillary: 206 mg/dL — ABNORMAL HIGH (ref 70–99)
Glucose-Capillary: 85 mg/dL (ref 70–99)

## 2014-07-29 LAB — BASIC METABOLIC PANEL
Anion gap: 12 (ref 5–15)
BUN: 13 mg/dL (ref 6–23)
CALCIUM: 8.2 mg/dL — AB (ref 8.4–10.5)
CHLORIDE: 107 meq/L (ref 96–112)
CO2: 26 meq/L (ref 19–32)
Creatinine, Ser: 1.24 mg/dL — ABNORMAL HIGH (ref 0.50–1.10)
GFR calc Af Amer: 52 mL/min — ABNORMAL LOW (ref >90)
GFR calc non Af Amer: 45 mL/min — ABNORMAL LOW (ref >90)
GLUCOSE: 107 mg/dL — AB (ref 70–99)
Potassium: 3.7 mEq/L (ref 3.7–5.3)
Sodium: 145 mEq/L (ref 137–147)

## 2014-07-29 LAB — CULTURE, RESPIRATORY

## 2014-07-29 LAB — CULTURE, RESPIRATORY W GRAM STAIN

## 2014-07-29 MED ORDER — INSULIN GLARGINE 100 UNIT/ML ~~LOC~~ SOLN
20.0000 [IU] | Freq: Every day | SUBCUTANEOUS | Status: DC
  Administered 2014-07-29: 20 [IU] via SUBCUTANEOUS
  Filled 2014-07-29 (×2): qty 0.2

## 2014-07-29 MED ORDER — INSULIN ASPART 100 UNIT/ML ~~LOC~~ SOLN
0.0000 [IU] | Freq: Three times a day (TID) | SUBCUTANEOUS | Status: DC
  Administered 2014-07-29: 3 [IU] via SUBCUTANEOUS
  Administered 2014-07-30 – 2014-07-31 (×2): 1 [IU] via SUBCUTANEOUS

## 2014-07-29 MED ORDER — INSULIN ASPART 100 UNIT/ML ~~LOC~~ SOLN
2.0000 [IU] | Freq: Three times a day (TID) | SUBCUTANEOUS | Status: DC
  Administered 2014-07-29: 2 [IU] via SUBCUTANEOUS

## 2014-07-29 MED ORDER — LEVOTHYROXINE SODIUM 125 MCG PO TABS
125.0000 ug | ORAL_TABLET | Freq: Every day | ORAL | Status: DC
  Filled 2014-07-29: qty 1

## 2014-07-29 MED ORDER — CLOPIDOGREL BISULFATE 75 MG PO TABS
75.0000 mg | ORAL_TABLET | Freq: Every day | ORAL | Status: DC
  Administered 2014-07-30 – 2014-07-31 (×2): 75 mg via ORAL
  Filled 2014-07-29 (×4): qty 1

## 2014-07-29 MED ORDER — PANTOPRAZOLE SODIUM 40 MG PO TBEC
40.0000 mg | DELAYED_RELEASE_TABLET | Freq: Every day | ORAL | Status: DC
  Administered 2014-07-29 – 2014-07-31 (×3): 40 mg via ORAL
  Filled 2014-07-29 (×3): qty 1

## 2014-07-29 MED ORDER — METOCLOPRAMIDE HCL 5 MG PO TABS
5.0000 mg | ORAL_TABLET | Freq: Two times a day (BID) | ORAL | Status: DC
  Administered 2014-07-29 – 2014-07-31 (×5): 5 mg via ORAL
  Filled 2014-07-29 (×8): qty 1

## 2014-07-29 MED ORDER — ONDANSETRON HCL 4 MG PO TABS: 4.0000 mg | ORAL_TABLET | Freq: Three times a day (TID) | ORAL | Status: DC | PRN

## 2014-07-29 MED ORDER — INSULIN ASPART 100 UNIT/ML ~~LOC~~ SOLN: 0.0000 [IU] | Freq: Three times a day (TID) | SUBCUTANEOUS | Status: DC

## 2014-07-29 MED ORDER — LEVOTHYROXINE SODIUM 125 MCG PO TABS
125.0000 ug | ORAL_TABLET | Freq: Every day | ORAL | Status: DC
  Administered 2014-07-29 – 2014-07-31 (×3): 125 ug via ORAL
  Filled 2014-07-29 (×4): qty 1

## 2014-07-29 MED ORDER — CIPROFLOXACIN HCL 500 MG PO TABS
500.0000 mg | ORAL_TABLET | Freq: Two times a day (BID) | ORAL | Status: DC
  Administered 2014-07-29 – 2014-07-31 (×5): 500 mg via ORAL
  Filled 2014-07-29 (×8): qty 1

## 2014-07-29 NOTE — Care Management Note (Signed)
    Page 1 of 1   07/31/2014     4:40:14 PM CARE MANAGEMENT NOTE 07/31/2014  Patient:  Bridget Martinez, Bridget Martinez   Account Number:  [id number]  Date Initiated:  07/27/2014  Documentation initiated by:  Luz Lex  Subjective/Objective Assessment:   Admitted with decreased LOC - hyperglycemia, resp failure.     Action/Plan:   Anticipated DC Date:  08/03/2014   Anticipated DC Plan:  [place]  CM consult      Choice offered to / List presented to:             Status of service:  Completed, signed off Medicare Important Message given?   (If response is "NO", the following Medicare IM given date fields will be blank) Date Medicare IM given:   Medicare IM given by:   Date Additional Medicare IM given:   Additional Medicare IM given by:    Discharge Disposition:  HOME/SELF CARE  Per UR Regulation:  Reviewed for med. necessity/level of care/duration of stay  If discussed at [place] of Stay Meetings, dates discussed:    Comments:  07/31/14 11:00 CM notes pt states she does not want HHPT/OT. HHRN not ordered.  No other Cm needs were communicated.  Mariane Masters, BSN, CM [phone].  [employer], Arman Spouse [place]   WINNELL, CALDERARO [phone]  07-29-14 9:50am Luz Lex, [place] [phone] Extubated 11-19.  Sitting up in chair - awake alert. States has had HH with [facility] in past - says they were good and is agreeable to having HHRN at discharge to f/u.  Also states in past had an aide which insuance covered, but she no longer has that benefit with current insurance.  Suspect this was maybe CAPS.  States does not want any HHPT. States will be fine.  Asked if has a walker or other assistive device at home.  States she will not use walker or cane - does not need.  CM will continue to follow.  07-27-14 10:40am Luz Lex, RNBSN [id number] In March of 2015 was discharged home with hh services of [facility].  May  need to have come back out for continued follow up on diabetes - CHF.

## 2014-07-29 NOTE — Progress Notes (Signed)
NUTRITION FOLLOW UP  Intervention:    Recommend advance diet as tolerated to CHO-modified.  If intake is poor, can add Glucerna Shake po TID, each supplement provides 220 kcal and 10 grams of protein  Nutrition Dx:   Inadequate oral intake related to recent intubation as evidenced by clear liquid diet. Ongoing.  Goal:   Intake to meet >90% of estimated nutrition needs.  Monitor:   Diet advancement, PO intake, labs, weight trend.  Assessment:   63 year old female presented to ED 11/17 with altered mental status. She was found to be profoundly hyperglycemic, acidemic, hyperkalemic and required intubation in ED.  Patient was extubated on 11/19. TF off since extubation. Discussed patient in ICU rounds today. Hopeful to transfer out of the ICU today. Diet has been advanced to clear liquids, patient tolerating well.  Height: Ht Readings from Last 1 Encounters:  07/26/14 5\' 5"  (1.651 m)    Weight Status:   Wt Readings from Last 1 Encounters:  07/28/14 184 lb 1.4 oz (83.5 kg)    Re-estimated needs:  Kcal: 1700-1900 Protein: 85-100 gm Fluid: >/= 1.8 L  Skin: intact  Diet Order: Diet clear liquid   Intake/Output Summary (Last 24 hours) at 07/29/14 1206 Last data filed at 07/29/14 1000  Gross per 24 hour  Intake 540.07 ml  Output   1010 ml  Net -469.93 ml    Last BM: 11/17   Labs:   Recent Labs Lab 07/27/14 0201  07/28/14 0405 07/28/14 0800 07/29/14 0500  NA 148*  < > 141 144 145  K 3.4*  < > 3.6* 3.9 3.7  CL 101  < > 103 106 107  CO2 22  < > 20 23 26   BUN 19  < > 16 15 13   CREATININE 0.91  < > 1.30* 1.25* 1.24*  CALCIUM 9.1  < > 8.0* 8.1* 8.2*  MG 1.5  --   --   --   --   PHOS 1.0*  --   --   --   --   GLUCOSE 195*  < > 176* 206* 107*  < > = values in this interval not displayed.  CBG (last 3)   Recent Labs  07/28/14 2346 07/29/14 0352 07/29/14 0748  GLUCAP 85 86 130*    Scheduled Meds: . ciprofloxacin  500 mg Oral Q12H  . [START ON  07/30/2014] clopidogrel  75 mg Oral Q breakfast  . heparin subcutaneous  5,000 Units Subcutaneous 3 times per day  . insulin aspart  2-6 Units Subcutaneous TID WC & HS  . insulin glargine  20 Units Subcutaneous Daily  . levothyroxine  125 mcg Oral QAC breakfast  . metoCLOPramide  5 mg Oral Q12H  . pantoprazole  40 mg Oral Daily    Continuous Infusions: . dextrose       Molli Barrows, RD, LDN, CNSC Pager [phone] After Hours Pager [phone]

## 2014-07-29 NOTE — Progress Notes (Signed)
PULMONARY / CRITICAL CARE MEDICINE   Name: Bridget Martinez MRN: [id number] DOB: 10-Jan-1951    ADMISSION DATE:  07/26/2014 CONSULTATION DATE:  07/26/2014  REFERRING MD:  EDP  CHIEF COMPLAINT:  AMS  INITIAL PRESENTATION: 63 year old female presented to ED 11/17 with altered mental status after husband notified EMS. She was found the be profoundly hyperglycemic, acidemic, hyperkalemic and required intubation in ED. PCCM has been asked to see for admission.   STUDIES:  CT head 11/17 >>> no acute, stable left frontal extra-axial calcification CXR 11/17 >>> Left lung base opacity likely atelectasis CXR 11/19 >>> increased bibasilar opacities  SIGNIFICANT EVENTS: 11/17 Intubated admitted to ICU, CT head negative 11/18 continue insulin gtt/AG 31, lactate decreasing CT dc'ed 11/19 improved BP, extubated  SUBJECTIVE:  11/20 RN reports confusion overnight, hemodynamically stable post extubation. Pt reports no pain, breathing is okay, no nausea.  VITAL SIGNS: Temp:  [98 F (36.7 C)-100 F (37.8 C)] 98 F (36.7 C) (11/20 0751) Pulse Rate:  [73-90] 73 (11/20 0600) Resp:  [7-18] 8 (11/20 0600) BP: (78-129)/(30-79) 99/41 mmHg (11/20 0600) SpO2:  [96 %-100 %] 100 % (11/20 0600) FiO2 (%):  [40 %] 40 % (11/19 1100) HEMODYNAMICS:   VENTILATOR SETTINGS: Vent Mode:  [-] PSV;CPAP FiO2 (%):  [40 %] 40 % PEEP:  [5 cmH20] 5 cmH20 Pressure Support:  [5 cmH20] 5 cmH20 INTAKE / OUTPUT:  Intake/Output Summary (Last 24 hours) at 07/29/14 8250 Last data filed at 07/29/14 0600  Gross per 24 hour  Intake 846.83 ml  Output    985 ml  Net -138.17 ml    PHYSICAL EXAMINATION: General:  Elderly female, appears older than stated age Neuro:  Alert and oriented x 4, follows commands, no focal deficits Cardiovascular: RRR, no mrg Lungs:  Clear bilaterally Abdomen:  Soft, mild diffuse tenderness without rebound or guarding, non-distended, +BS Musculoskeletal:  No acute deformity, 2+ PT pulses  bilat Skin: Intact, no LE edema noted.   LABS:  CBC  Recent Labs Lab 07/26/14 1329 07/26/14 1340 07/27/14 0201 07/28/14 0405  WBC 16.5*  --  8.0 10.6*  HGB 11.0* 12.9 9.6* 9.7*  HCT 34.5* 38.0 27.6* 30.1*  PLT 326  --  244 237   Coag's  Recent Labs Lab 07/26/14 1800  APTT 23*  INR 1.15   BMET  Recent Labs Lab 07/28/14 0405 07/28/14 0800 07/29/14 0500  NA 141 144 145  K 3.6* 3.9 3.7  CL 103 106 107  CO2 20 23 26   BUN 16 15 13   CREATININE 1.30* 1.25* 1.24*  GLUCOSE 176* 206* 107*   Electrolytes  Recent Labs Lab 07/27/14 0201  07/28/14 0405 07/28/14 0800 07/29/14 0500  CALCIUM 9.1  < > 8.0* 8.1* 8.2*  MG 1.5  --   --   --   --   PHOS 1.0*  --   --   --   --   < > = values in this interval not displayed. Sepsis Markers  Recent Labs Lab 07/26/14 1340 07/26/14 1800 07/26/14 2136 07/27/14 0400 07/27/14 1000 07/28/14 0405  LATICACIDVEN 5.65* 7.5*  --   --  3.1*  --   PROCALCITON  --   --  9.40 10.29  --  8.69   ABG  Recent Labs Lab 07/26/14 1655 07/27/14 0546 07/27/14 0959  PHART 7.191* 7.508* 7.388  PCO2ART 21.0* 20.3* 46.5*  PO2ART 184.0* 174.0* 170.0*   Liver Enzymes  Recent Labs Lab 07/26/14 1329 07/27/14 1000  AST 14 12  ALT 9 7  ALKPHOS 182* 135*  BILITOT 0.4 0.6  ALBUMIN 3.0* 2.4*   Cardiac Enzymes  Recent Labs Lab 07/26/14 1800 07/26/14 2053 07/27/14 0201  TROPONINI 0.69* 1.08* 0.93*  PROBNP 2156.0*  --   --    Glucose  Recent Labs Lab 07/28/14 1344 07/28/14 1448 07/28/14 1545 07/28/14 1900 07/28/14 2346 07/29/14 0352  GLUCAP 128* 105* 113* 116* 85 86    Imaging Dg Chest Port 1 View  07/28/2014   CLINICAL DATA:  Endotracheal tube placement.  EXAM: PORTABLE CHEST - 1 VIEW  COMPARISON:  July 27, 2014.  FINDINGS: Stable cardiomediastinal silhouette. Status post coronary artery bypass graft. Endotracheal tube is in grossly good position with distal tip 3.5 cm above the carina. Nasogastric tube is seen  entering the stomach. Left internal jugular catheter is unchanged with distal tip in expected position of the SVC. No pneumothorax is noted. Mildly increased bilateral basilar opacities are noted consistent with subsegmental atelectasis.  IMPRESSION: Stable support apparatus. Increased bibasilar opacities are noted consistent with subsegmental atelectasis.   Electronically Signed   By: Sabino Dick M.D.   On: 07/28/2014 07:08    ASSESSMENT / PLAN:  ENDOCRINE A:   Diabetic ketoacidosis, severe - resolved DM Hypothyroid P:   Continue synthroid Lantus 20 units daily, follow needs further Insulin changed to qAC qHS Diabetes coordinator consult placed, frequent DKAs.  CARDIOVASCULAR A:  H/o HTN Systolic and Diastolic CHF: EF 05-39%, grade 1 dias dys (old) Troponin leak - demand ischemia, downtrended P:  Scheduled metoprolol on hold until of off pressors Hold PO coreg, lasix, statin, aldactone - restart as BP increases Plavix May need bolus  PULMONARY OETT 11/17>>11/19 A: Acute respiratory failure 2nd to AMS P:   Monitor IS upright Mobilize No repeat pcx needed  RENAL A:   AKI, scr stable 1.25 (baseline ~1) High AG metabolic acidosis (lactic, ketoacidosis) - resolved Met alk (got bicarb) - resolved Hyperkalemia - resolved Hypernatremia - resolved P:   Chem in am Monitor UOP  GASTROINTESTINAL A:   GERD Lactic acidosis, improved Chronic abdo pain P:   SUP: IV protonix Reassuring abdo exam Needs CT pre dc from hospital  HEMATOLOGIC A:   DVT prevention P:  On plavix DVT: heparin sq Follow CBC  INFECTIOUS A:   Unclear source, met sepsis syndrome P:   BCx2 11/17 >> UC 11/17 >>> neg Sputum 11/17 >> re-culture for better growth Abx: Zosyn, start date 11/17>>>11/18 Abx: Vancomycin, start date 11/17>>>11/19 Ceftaz 11/18>>>(nosocomial exposure)  Empiric antibiotic treatment x 7 days, consider transition to cipro Will need CT abdo prior to  dc  NEUROLOGIC A:   Acute encephalopathy, improved P:   RASS goal: 0 PT consult  FAMILY  - Updates: Husband 11/17, 19th. Patient on 20th  - Inter-disciplinary family meet or Palliative Care meeting due by:  11/24  Dispo: transfer to floor, to [place], MD 07/29/2014, 7:52 AM PGY-2, [facility]  STAFF NOTE: I, Merrie Roof, MD FACP have personally reviewed patient's available data, including medical history, events of note, physical examination and test results as part of my evaluation. I have discussed with resident/NP and other care providers such as pharmacist, RN and RRT. In addition, I personally evaluated patient and elicited key findings of: .doing well, no distress, glucose controlled, change to cipro, needs ct pre dc, bme tin am    [name]. Titus Mould, MD, [place] Pgr: Post Pulmonary & Critical Care 07/29/2014 10:59 AM

## 2014-07-29 NOTE — Progress Notes (Signed)
Inpatient Diabetes Program Recommendations  AACE/ADA: New Consensus Statement on Inpatient Glycemic Control (2013)  Target Ranges:  Prepandial:   less than 140 mg/dL      Peak postprandial:   less than 180 mg/dL (1-2 hours)      Critically ill patients:  140 - 180 mg/dL   Reason for Visit: Referral received.    Diabetes history: Type 1 diabetes Outpatient Diabetes medications:  Levemir 14 units bid, Novolog 15 units q AM, and 20 units with lunch and supper.  Note that she see's Dr. Buddy Duty as outpatient endocrinologist Current orders for Inpatient glycemic control:  Patient received Lantus 20 units this morning.  MD please continue Lantus 20 units q AM, Novolog sensitive correction and Novolog 4 units tid with meals.  Called and discussed with RN.  Needs insulin orders prior to transfer out of ICU.  Spoke to patient regarding possible cause of DKA. She states that she was nauseas and vomiting and was afraid to take her insulin.  Discussed with her that even when she is not eating she needs insulin.  She states "I know, I just felt so bad".  Encouraged her to discuss "Sick Day" plan with MD-Dr. Buddy Duty. Called and also spoke with patient's daughter Danae Chen.  She states when her mother gets sick, she gets sick very fast.  Explained Type 1 diabetes and the need for insulin to prevent DKA.  I encouraged daughter to go with her mother to see Dr. Buddy Duty and request a "sick day" plan regarding frequent CBG checks, when to call MD, and possibly decreased doses of insulin.  Patient states that CBG's have been better since she has been seeing Dr. Buddy Duty but admits that she is not always sure what to do when sick. Encouraged her to call MD when she is sick to get further instructions and not omit insulin.   Thanks, Adah Perl, RN, BC-ADM Inpatient Diabetes Coordinator Pager [phone]

## 2014-07-30 ENCOUNTER — Encounter (HOSPITAL_COMMUNITY): Payer: Self-pay | Admitting: Radiology

## 2014-07-30 ENCOUNTER — Inpatient Hospital Stay (HOSPITAL_COMMUNITY): Payer: BC Managed Care – PPO

## 2014-07-30 DIAGNOSIS — E1143 Type 2 diabetes mellitus with diabetic autonomic (poly)neuropathy: Secondary | ICD-10-CM

## 2014-07-30 DIAGNOSIS — I5022 Chronic systolic (congestive) heart failure: Secondary | ICD-10-CM

## 2014-07-30 DIAGNOSIS — N183 Chronic kidney disease, stage 3 (moderate): Secondary | ICD-10-CM

## 2014-07-30 DIAGNOSIS — R6510 Systemic inflammatory response syndrome (SIRS) of non-infectious origin without acute organ dysfunction: Secondary | ICD-10-CM | POA: Diagnosis present

## 2014-07-30 DIAGNOSIS — E1041 Type 1 diabetes mellitus with diabetic mononeuropathy: Secondary | ICD-10-CM

## 2014-07-30 LAB — GLUCOSE, CAPILLARY
GLUCOSE-CAPILLARY: 145 mg/dL — AB (ref 70–99)
GLUCOSE-CAPILLARY: 77 mg/dL (ref 70–99)
GLUCOSE-CAPILLARY: 90 mg/dL (ref 70–99)
Glucose-Capillary: 109 mg/dL — ABNORMAL HIGH (ref 70–99)

## 2014-07-30 MED ORDER — IOHEXOL 300 MG/ML  SOLN
25.0000 mL | INTRAMUSCULAR | Status: AC
  Administered 2014-07-30 (×2): 25 mL via ORAL

## 2014-07-30 MED ORDER — CARVEDILOL 6.25 MG PO TABS
6.2500 mg | ORAL_TABLET | Freq: Two times a day (BID) | ORAL | Status: DC
  Administered 2014-07-30 – 2014-07-31 (×2): 6.25 mg via ORAL
  Filled 2014-07-30 (×3): qty 1

## 2014-07-30 MED ORDER — VITAMIN D (ERGOCALCIFEROL) 1.25 MG (50000 UNIT) PO CAPS
50000.0000 [IU] | ORAL_CAPSULE | ORAL | Status: DC
Start: 2014-07-30 — End: 2014-07-31
  Administered 2014-07-30: 50000 [IU] via ORAL
  Filled 2014-07-30 (×2): qty 1

## 2014-07-30 MED ORDER — HYOSCYAMINE SULFATE 0.125 MG PO TABS
0.1250 mg | ORAL_TABLET | ORAL | Status: DC | PRN
  Filled 2014-07-30: qty 1

## 2014-07-30 MED ORDER — ESCITALOPRAM OXALATE 10 MG PO TABS
10.0000 mg | ORAL_TABLET | Freq: Every day | ORAL | Status: DC
  Administered 2014-07-30 – 2014-07-31 (×2): 10 mg via ORAL
  Filled 2014-07-30 (×2): qty 1

## 2014-07-30 MED ORDER — PANTOPRAZOLE SODIUM 40 MG PO TBEC: 40.0000 mg | DELAYED_RELEASE_TABLET | Freq: Every day | ORAL | Status: DC

## 2014-07-30 MED ORDER — INSULIN DETEMIR 100 UNIT/ML ~~LOC~~ SOLN
14.0000 [IU] | Freq: Two times a day (BID) | SUBCUTANEOUS | Status: DC
  Administered 2014-07-30 (×2): 14 [IU] via SUBCUTANEOUS
  Filled 2014-07-30 (×3): qty 0.14

## 2014-07-30 MED ORDER — ROSUVASTATIN CALCIUM 40 MG PO TABS
40.0000 mg | ORAL_TABLET | Freq: Every day | ORAL | Status: DC
  Administered 2014-07-30 – 2014-07-31 (×2): 40 mg via ORAL
  Filled 2014-07-30 (×3): qty 1

## 2014-07-30 MED ORDER — ASPIRIN EC 81 MG PO TBEC
81.0000 mg | DELAYED_RELEASE_TABLET | Freq: Every day | ORAL | Status: DC
  Administered 2014-07-30 – 2014-07-31 (×2): 81 mg via ORAL
  Filled 2014-07-30 (×2): qty 1

## 2014-07-30 NOTE — Progress Notes (Signed)
Patient ID: Bridget Martinez  female  [id number]    DOB: [birth year]    DOA: 07/26/2014  PCP: [name], MD  Brief history of present illness  Patient is a 63 year old female who presented to ED on 11/17 with altered mental status after husband notified EMS. Patient was found to be profoundly hyperglycemic, acidemic, hyperkalemic and required intubation in ED. Patient was admitted by critical care service. Patient was placed on IV insulin drip.  She was extubated on 07/28/14. Patient was transferred to the floor on 07/29/14 and [facility] is assuming care on 11/21.  Assessment/Plan: Principal Problem:   Diabetic ketoacidosis -Currently resolved, continue sliding scale insulin, placed back on the home dose of Levemir 14 units BID - Diabetic coordinator following, patient also has endocrinologist, Dr. Buddy Duty  Active Problems:   Hyperlipidemia -Continue statin  Chronic systolic CHF, EF 99991111 with grade 1 diastolic dysfunction - Restart Coreg, BP now stable, off vasopressors - Hold off on Lasix and Aldactone for now    Hypertension -Currently BP stable, will restart Coreg    CKD (chronic kidney disease), stage III - Currently stable   Acute respiratory failure secondary to altered mental status from profound diabetic ketoacidosis - Currently stable, extubated on 11/19  Hypernatremia, hyperkalemia, high anion gap metabolic acidosis, diabetic ketoacidosis, lactic acidosis:  resolved    Diabetic gastroparesis - Continue Reglan    SIRS (systemic inflammatory response syndrome) - Unclear source blood cultures negative, urine culture negative so far, and sputum cultures negative, patient was started on vancomycin and Zosyn, subsequently transitioned to oral ciprofloxacin  - CT abdomen and pelvis ordered by CCM to rule out any abdominal pathology    DVT Prophylaxis:  Code Status:  Family Communication:  Disposition: hopefully tomorrow, PTOT evaluation today out of bed    Consultants:  none   Antibiotics: Oral ciprofloxacin 11/20>  Subjective   feeling better today, no complaints, still somewhat weak blood sugars improving this morning    Objective: Weight change:   Intake/Output Summary (Last 24 hours) at 07/30/14 1056 Last data filed at 07/30/14 0949  Gross per 24 hour  Intake    470 ml  Output    975 ml  Net   -505 ml   Blood pressure 130/58, pulse 82, temperature 97.9 F (36.6 C), temperature source Oral, resp. rate 16, height 5\' 5"  (1.651 m), weight 83.5 kg (184 lb 1.4 oz), SpO2 100 %.  Physical Exam: General: Alert and awake, oriented x3, not in any acute distress. CVS: S1-S2 clear, no murmur rubs or gallops Chest: clear to auscultation bilaterally, no wheezing, rales or rhonchi Abdomen: soft nontender, nondistended, normal bowel sounds  Extremities: no cyanosis, clubbing or edema noted bilaterally Neuro: Cranial nerves II-XII intact, no focal neurological deficits  Lab Results: Basic Metabolic Panel:  Recent Labs Lab 07/27/14 0201  07/28/14 0800 07/29/14 0500  NA 148*  < > 144 145  K 3.4*  < > 3.9 3.7  CL 101  < > 106 107  CO2 22  < > 23 26  GLUCOSE 195*  < > 206* 107*  BUN 19  < > 15 13  CREATININE 0.91  < > 1.25* 1.24*  CALCIUM 9.1  < > 8.1* 8.2*  MG 1.5  --   --   --   PHOS 1.0*  --   --   --   < > = values in this interval not displayed. Liver Function Tests:  Recent Labs Lab 07/26/14 1329 07/27/14 1000  AST  14 12  ALT 9 7  ALKPHOS 182* 135*  BILITOT 0.4 0.6  PROT 6.7 5.2*  ALBUMIN 3.0* 2.4*   No results for input(s): LIPASE, AMYLASE in the last 168 hours. No results for input(s): AMMONIA in the last 168 hours. CBC:  Recent Labs Lab 07/26/14 1329  07/27/14 0201 07/28/14 0405  WBC 16.5*  --  8.0 10.6*  NEUTROABS 13.3*  --   --   --   HGB 11.0*  < > 9.6* 9.7*  HCT 34.5*  < > 27.6* 30.1*  MCV 91.0  --  86.0 88.8  PLT 326  --  244 237  < > = values in this interval not displayed. Cardiac  Enzymes:  Recent Labs Lab 07/26/14 1800 07/26/14 2053 07/27/14 0201  TROPONINI 0.69* 1.08* 0.93*   BNP: Invalid input(s): POCBNP CBG:  Recent Labs Lab 07/29/14 1134 07/29/14 1544 07/29/14 1855 07/29/14 1947 07/30/14 0744  GLUCAP 116* 206* 204* 206* 109*     Micro Results: Recent Results (from the past 240 hour(s))  Blood Culture (routine x 2)     Status: None (Preliminary result)   Collection Time: 07/26/14  1:36 PM  Result Value Ref Range Status   Specimen Description BLOOD HAND RIGHT  Final   Special Requests BOTTLES DRAWN AEROBIC ONLY 2CC  Final   Culture  Setup Time   Final    07/26/2014 18:37 Performed at [facility]    Culture   Final           BLOOD CULTURE RECEIVED NO GROWTH TO DATE CULTURE WILL BE HELD FOR 5 DAYS BEFORE ISSUING A FINAL NEGATIVE REPORT Performed at [facility]    Report Status PENDING  Incomplete  Blood Culture (routine x 2)     Status: None (Preliminary result)   Collection Time: 07/26/14  1:52 PM  Result Value Ref Range Status   Specimen Description BLOOD WRIST LEFT  Final   Special Requests BOTTLES DRAWN AEROBIC ONLY 2CC  Final   Culture  Setup Time   Final    07/26/2014 18:37 Performed at [facility]    Culture   Final           BLOOD CULTURE RECEIVED NO GROWTH TO DATE CULTURE WILL BE HELD FOR 5 DAYS BEFORE ISSUING A FINAL NEGATIVE REPORT Performed at [facility]    Report Status PENDING  Incomplete  Urine culture     Status: None   Collection Time: 07/26/14  2:00 PM  Result Value Ref Range Status   Specimen Description URINE, CATHETERIZED  Final   Special Requests NONE  Final   Culture  Setup Time   Final    07/26/2014 21:26 Performed at [place] Performed at [facility]   Final   Culture NO GROWTH Performed at [facility]   Final   Report Status 07/27/2014 FINAL  Final  MRSA PCR Screening     Status: None   Collection Time:  07/26/14  4:50 PM  Result Value Ref Range Status   MRSA by PCR NEGATIVE NEGATIVE Final    Comment:        The GeneXpert MRSA Assay (FDA approved for NASAL specimens only), is one component of a comprehensive MRSA colonization surveillance program. It is not intended to diagnose MRSA infection nor to guide or monitor treatment for MRSA infections.   Culture, respiratory (NON-Expectorated)     Status: None   Collection  Time: 07/26/14  5:01 PM  Result Value Ref Range Status   Specimen Description TRACHEAL ASPIRATE  Final   Special Requests NONE  Final   Gram Stain   Final    MODERATE WBC PRESENT,BOTH PMN AND MONONUCLEAR NO SQUAMOUS EPITHELIAL CELLS SEEN NO ORGANISMS SEEN Performed at [facility]    Culture   Final    Non-Pathogenic Oropharyngeal-type Flora Isolated. Performed at [facility]    Report Status 07/29/2014 FINAL  Final    Studies/Results: Ct Head Wo Contrast  07/26/2014   CLINICAL DATA:  Altered mental status, hyperglycemia  EXAM: CT HEAD WITHOUT CONTRAST  TECHNIQUE: Contiguous axial images were obtained from the base of the skull through the vertex without intravenous contrast.  COMPARISON:  10/25/2013  FINDINGS: Mild atrophy.  Normal ventricular morphology.  No midline shift or mass effect.  Stable calcification adjacent to inner table of LEFT frontal bone since earlier study of 05/26/2007.  No intracranial hemorrhage, mass lesion or evidence acute infarction.  No extra-axial fluid collections.  Atherosclerotic calcifications at the carotid siphons bilaterally.  Bones and sinuses otherwise unremarkable.  IMPRESSION: No acute intracranial abnormalities.  Stable LEFT frontal extra-axial calcification since 2008.   Electronically Signed   By: Lavonia Dana M.D.   On: 07/26/2014 16:49   Dg Chest Port 1 View  07/29/2014   CLINICAL DATA:  Respiratory failure.  EXAM: PORTABLE CHEST - 1 VIEW  COMPARISON:  07/28/2014  FINDINGS: Endotracheal tube and NG tube  have been removed.  The heart size and pulmonary vascularity are normal. The lungs are now clear. No effusions. No acute osseous abnormality. CABG.  IMPRESSION: No acute abnormalities.  Clear lungs.   Electronically Signed   By: Rozetta Nunnery M.D.   On: 07/29/2014 08:04   Dg Chest Port 1 View  07/28/2014   CLINICAL DATA:  Endotracheal tube placement.  EXAM: PORTABLE CHEST - 1 VIEW  COMPARISON:  July 27, 2014.  FINDINGS: Stable cardiomediastinal silhouette. Status post coronary artery bypass graft. Endotracheal tube is in grossly good position with distal tip 3.5 cm above the carina. Nasogastric tube is seen entering the stomach. Left internal jugular catheter is unchanged with distal tip in expected position of the SVC. No pneumothorax is noted. Mildly increased bilateral basilar opacities are noted consistent with subsegmental atelectasis.  IMPRESSION: Stable support apparatus. Increased bibasilar opacities are noted consistent with subsegmental atelectasis.   Electronically Signed   By: Sabino Dick M.D.   On: 07/28/2014 07:08   Dg Chest Port 1 View  07/27/2014   CLINICAL DATA:  63 year old female with respiratory failure. Intubated. Initial encounter.  EXAM: PORTABLE CHEST - 1 VIEW  COMPARISON:  07/26/2014 and earlier.  FINDINGS: Portable AP semi upright view at 0513 hr. Stable endotracheal tube tip at the level the clavicles. Enteric tube courses to the left abdomen as before. Stable left IJ central line. Normal cardiac size and mediastinal contours. Sequelae of CABG. Interval regressed streaky left lung base opacity. Allowing for portable technique, the lungs are clear.  IMPRESSION: 1.  Stable lines and tubes. 2. Interval regressed atelectasis. No acute cardiopulmonary abnormality.   Electronically Signed   By: Lars Pinks M.D.   On: 07/27/2014 06:40   Dg Chest Portable 1 View  07/26/2014   CLINICAL DATA:  63 year old female central line placement. Unresponsive. Initial encounter.  EXAM: PORTABLE  CHEST - 1 VIEW  COMPARISON:  1400 hr the same day and earlier.  FINDINGS: Portable AP supine view at 1518 hrs.  Endotracheal tube tip now at the level the clavicles. New left IJ approach central venous catheter, tip projects one vertebral body below the carina. Enteric tube courses to the left upper quadrant as before.  Stable cardiac size and mediastinal contours. Sequelae of CABG. Improved lung volumes. Except for streaky opacity at the left lung base the lungs appear clear. No pneumothorax.  IMPRESSION: 1. Left IJ central line placed, tip at the lower SVC level. No pneumothorax. 2. ETT tip now at the clavicles.  Stable enteric tube. 3. Streaky left lung base opacity, most resembles atelectasis.   Electronically Signed   By: Lars Pinks M.D.   On: 07/26/2014 15:43   Dg Chest Port 1 View  07/26/2014   CLINICAL DATA:  63 year old female intubated. Initial encounter.  EXAM: PORTABLE CHEST - 1 VIEW  COMPARISON:  11/10/2013 and earlier.  FINDINGS: Portable AP supine view at 1400 hrs. Endotracheal tube tip 2 cm above the carina. Resuscitation pads in place. Enteric tube courses to the left upper quadrant, side hole projects over the stomach. Lower lung volumes. Stable cardiac size and mediastinal contours. Sequelae of CABG. No pneumothorax, pulmonary edema, pleural effusion or consolidation identified.  IMPRESSION: 1. Intubated. ETT tip 2 cm above the carina. Enteric tube with side hole projecting over the stomach. 2. Lower lung volumes, no acute cardiopulmonary abnormality.   Electronically Signed   By: Lars Pinks M.D.   On: 07/26/2014 14:20   Dg Abd Portable 1v  07/27/2014   CLINICAL DATA:  Encounter for enteric feeding tube placement.  EXAM: PORTABLE ABDOMEN - 1 VIEW  COMPARISON:  None.  FINDINGS: Enteric tube passes below the diaphragm. Tip projects in the mid to distal stomach.  Normal bowel gas pattern.  IMPRESSION: Enteric tube tip lies in the mid to distal stomach.   Electronically Signed   By: Lajean Manes  M.D.   On: 07/27/2014 11:42    Medications: Scheduled Meds: . aspirin EC  81 mg Oral Daily  . carvedilol  6.25 mg Oral BID WC  . ciprofloxacin  500 mg Oral Q12H  . clopidogrel  75 mg Oral Q breakfast  . escitalopram  10 mg Oral Daily  . heparin subcutaneous  5,000 Units Subcutaneous 3 times per day  . insulin aspart  0-9 Units Subcutaneous TID WC  . insulin detemir  14 Units Subcutaneous BID  . levothyroxine  125 mcg Oral QAC breakfast  . metoCLOPramide  5 mg Oral Q12H  . pantoprazole  40 mg Oral Daily  . pantoprazole  40 mg Oral Daily  . rosuvastatin  40 mg Oral Daily  . Vitamin D (Ergocalciferol)  50,000 Units Oral Weekly      LOS: 4 days   Trinnity Breunig M.D. [facility] 07/30/2014, 10:56 AM Pager: [phone]  If 7PM-7AM, please contact night-coverage [email or web address] Password [email or web address]

## 2014-07-30 NOTE — Evaluation (Signed)
Physical Therapy Evaluation Patient Details Name: Bridget Martinez MRN: [id number] DOB: 10/06/1950 Today's Date: 07/30/2014   History of Present Illness  63 year old female presented to ED 11/17 with altered mental status after husband notified EMS. She was found the be profoundly hyperglycemic, acidemic, and required intubation in ED.  Pt admitted with dx of DKA.  Clinical Impression  Pt admitted with above. Pt currently with functional limitations due to the deficits listed below (see PT Problem List). Min assist required for transfers and gait. PT to further assess hallway ambulation with RW.  Eval session was limited due to pt's need to use the [facility].  Pt will benefit from skilled PT to increase their independence and safety with mobility to allow discharge to the venue listed below.       Follow Up Recommendations Home health PT;Supervision/Assistance - 24 hour    Equipment Recommendations  None recommended by PT    Recommendations for Other Services       Precautions / Restrictions Precautions Precautions: Fall      Mobility  Bed Mobility Overal bed mobility: Modified Independent                Transfers Overall transfer level: Needs assistance Equipment used: None Transfers: Sit to/from Stand;Stand Pivot Transfers Sit to Stand: Min assist Stand pivot transfers: Min assist       General transfer comment: verbal cues for safety  Ambulation/Gait Ambulation/Gait assistance: Min assist Ambulation Distance (Feet): 15 Feet Assistive device: 1 person hand held assist Gait Pattern/deviations: Decreased stride length Gait velocity: decreased      Stairs            Wheelchair Mobility    Modified Rankin (Stroke Patients Only)       Balance                                             Pertinent Vitals/Pain Pain Assessment: No/denies pain    Home Living Family/patient expects to be discharged to:: Private residence Living  Arrangements: Spouse/significant other Available Help at Discharge: Family Type of Home: House Home Access: Stairs to enter Entrance Stairs-Rails: Right Entrance Stairs-Number of Steps: 4 Home Layout: One level Home Equipment: Environmental consultant - 2 wheels      Prior Function Level of Independence: Independent               Hand Dominance        Extremity/Trunk Assessment   Upper Extremity Assessment: Generalized weakness           Lower Extremity Assessment: Generalized weakness      Cervical / Trunk Assessment: Normal  Communication   Communication: No difficulties  Cognition Arousal/Alertness: Awake/alert Behavior During Therapy: Flat affect Overall Cognitive Status: Within Functional Limits for tasks assessed                      General Comments      Exercises        Assessment/Plan    PT Assessment Patient needs continued PT services  PT Diagnosis Difficulty walking;Generalized weakness   PT Problem List Decreased activity tolerance;Decreased balance;Decreased mobility;Decreased safety awareness  PT Treatment Interventions DME instruction;Gait training;Stair training;Functional mobility training;Therapeutic activities;Therapeutic exercise;Patient/family education;Balance training   PT Goals (Current goals can be found in the Care Plan section) Acute Rehab PT Goals Patient Stated Goal: not stated PT  Goal Formulation: With patient Time For Goal Achievement: 07/30/14 Potential to Achieve Goals: Good    Frequency Min 3X/week   Barriers to discharge        Co-evaluation               End of Session Equipment Utilized During Treatment: Gait belt Activity Tolerance: Patient tolerated treatment well Patient left: with call bell/phone within reach (on [facility]) Nurse Communication: Mobility status         Time: [id number] PT Time Calculation (min) (ACUTE ONLY): 23 min   Charges:   PT Evaluation $Initial PT Evaluation Tier I: 1  Procedure PT Treatments $Therapeutic Activity: 8-22 mins   PT G Codes:          Lorriane Shire 07/30/2014, 4:24 PM

## 2014-07-31 LAB — POCT I-STAT 3, ART BLOOD GAS (G3+)
ACID-BASE DEFICIT: 6 mmol/L — AB (ref 0.0–2.0)
Bicarbonate: 19.5 mEq/L — ABNORMAL LOW (ref 20.0–24.0)
O2 SAT: 97 %
PCO2 ART: 39.4 mmHg (ref 35.0–45.0)
PO2 ART: 105 mmHg — AB (ref 80.0–100.0)
Patient temperature: 98.7
TCO2: 21 mmol/L (ref 0–100)
pH, Arterial: 7.302 — ABNORMAL LOW (ref 7.350–7.450)

## 2014-07-31 LAB — GLUCOSE, CAPILLARY
GLUCOSE-CAPILLARY: 146 mg/dL — AB (ref 70–99)
GLUCOSE-CAPILLARY: 212 mg/dL — AB (ref 70–99)

## 2014-07-31 MED ORDER — PANTOPRAZOLE SODIUM 40 MG PO TBEC: 40.0000 mg | DELAYED_RELEASE_TABLET | Freq: Every day | ORAL | Status: DC

## 2014-07-31 MED ORDER — INSULIN DETEMIR 100 UNIT/ML ~~LOC~~ SOLN
12.0000 [IU] | Freq: Two times a day (BID) | SUBCUTANEOUS | Status: DC
  Filled 2014-07-31: qty 0.12

## 2014-07-31 MED ORDER — CIPROFLOXACIN HCL 500 MG PO TABS: 500.0000 mg | ORAL_TABLET | Freq: Two times a day (BID) | ORAL | Status: DC

## 2014-07-31 MED ORDER — INSULIN DETEMIR 100 UNIT/ML ~~LOC~~ SOLN
14.0000 [IU] | Freq: Two times a day (BID) | SUBCUTANEOUS | Status: DC
  Administered 2014-07-31: 14 [IU] via SUBCUTANEOUS
  Filled 2014-07-31 (×2): qty 0.14

## 2014-07-31 NOTE — Progress Notes (Signed)
Pt discharged to home accompy by dtr and husband.  Sick day plan reviewed with pt and FU appts for Dr. Laurance Flatten and Dr. Buddy Duty were reviewed with pt and husband.  All DC instructions reviewed and copy given.

## 2014-07-31 NOTE — Discharge Summary (Signed)
Physician Discharge Summary  Patient ID: Bridget Martinez MRN: [id number] DOB/AGE: 64-17-1952 64 y.o.  Admit date: 07/26/2014 Discharge date: 07/31/2014  Primary Care Physician:  [name], MD  Discharge Diagnoses:    . severe Diabetic ketoacidosis . acute Respiratory failure requiring mechanical ventilation and intubation  . Hypertension . Hyperlipidemia . DM (diabetes mellitus), type 1, uncontrolled w/neurologic complication . Diabetic gastroparesis . CKD (chronic kidney disease), stage III . Chronic systolic heart failure . SIRS (systemic inflammatory response syndrome)  Consults:  Patient was admitted by critical care service on 07/26/14 Diabetic coordinator Endocrinology, Dr. Buddy Duty on the phone   Recommendations for Outpatient Follow-up:  Patient was strongly advised to keep a log of her blood sugars and take it to her endocrinologist for further adjustment of her insulin. She was also strongly advised to discuss "sick day planning" with Dr Buddy Duty to avoid diabetic ketoacidosis  Allergies:   Allergies  Allergen Reactions  . Sulfa Antibiotics Anaphylaxis and Swelling    Stiff neck also      Discharge Medications:   Medication List    TAKE these medications        alendronate 70 MG tablet  Commonly known as:  FOSAMAX  Take 70 mg by mouth once a week.     aspirin EC 81 MG tablet  Take 1 tablet (81 mg total) by mouth daily.     carvedilol 6.25 MG tablet  Commonly known as:  COREG  Take 1 tablet (6.25 mg total) by mouth 2 (two) times daily with a meal.     ciprofloxacin 500 MG tablet  Commonly known as:  CIPRO  Take 1 tablet (500 mg total) by mouth 2 (two) times daily. X 3 more days     clopidogrel 75 MG tablet  Commonly known as:  PLAVIX  Take 1 tablet (75 mg total) by mouth daily with breakfast.     escitalopram 10 MG tablet  Commonly known as:  LEXAPRO  Take 1 tablet (10 mg total) by mouth daily.     furosemide 40 MG tablet  Commonly known as:   LASIX  Take 1 tablet (40 mg total) by mouth daily.     hyoscyamine 0.125 MG tablet  Commonly known as:  LEVSIN, ANASPAZ  Take 0.125 mg by mouth every 4 (four) hours as needed for bladder spasms.     insulin aspart 100 UNIT/ML injection  Commonly known as:  novoLOG  Inject 100 Units into the skin 3 (three) times daily with meals. 15 units at breakfast, 20 units at lunch, 20 units supper Plus 1 unit for every 35mg /dL above 100 mg/dL     insulin detemir 100 UNIT/ML injection  Commonly known as:  LEVEMIR  Inject 14 Units into the skin 2 (two) times daily.     levothyroxine 125 MCG tablet  Commonly known as:  SYNTHROID, LEVOTHROID  Take 1 tablet (125 mcg total) by mouth daily.     metoCLOPramide 10 MG tablet  Commonly known as:  REGLAN  Take 1 tablet (10 mg total) by mouth 4 (four) times daily -  before meals and at bedtime.     ondansetron 8 MG disintegrating tablet  Commonly known as:  ZOFRAN ODT  Take 1 tablet (8 mg total) by mouth every 8 (eight) hours as needed for nausea or vomiting.     pantoprazole 40 MG tablet  Commonly known as:  PROTONIX  Take 1 tablet (40 mg total) by mouth daily.     rosuvastatin 40 MG  tablet  Commonly known as:  CRESTOR  Take 1 tablet (40 mg total) by mouth daily.     spironolactone 25 MG tablet  Commonly known as:  ALDACTONE  Take 12.5 mg by mouth daily.     Vitamin D (Ergocalciferol) 50000 UNITS Caps capsule  Commonly known as:  DRISDOL  Take 1 capsule by mouth once a week. On saturday         Brief H and P: For complete details please refer to admission H and P, but in brief patient is a 63 year old female with type 1 diabetes on insulin and presented to ED on 11/17 with altered mental status after husband notified EMS. Patient was found to be profoundly hyperglycemic, acidemic, hyperchloremic, required intubation in 80. She was admitted by critical care service. She was placed on IV insulin drip. She was extubated on 07/28/14 and  transferred to the floor on 11/20.  Hospital Course:  Profound diabetic ketoacidosis: Currently resolved patient had apparently stopped taking her insulin because she was feeling sick which probably led to profound DKA with high anion gap metabolic acidosis, lactic acidosis. At the time of admission potassium was 7.1, CO2 less than 7, anion gap of 37, glucose 1029, pH 6.9 Patient was intubated due to acute mental status changes from profound DKA. Patient was placed on insulin drip, bicarbonate drip and IV fluids. She has been transitioned back to her home dose of Levemir and sliding scale insulin. Patient was strongly advised to have a sick day plan, I have explained her in great detail to continue taking her insulin especially to long-acting insulin despite being sick. She was advised to discus this with Dr. Buddy Duty on her follow-up appointment this week. I was able to discuss with Dr. Buddy Duty, her endocrinologist who recommended to continue outpatient insulin regimen for discharge. Further adjustments will be done in the office after reviewing her log, Dr. Buddy Duty will also emphasize the sick day planning with her.   Active Problems:  Hyperlipidemia -Continue statin  Chronic systolic CHF, EF 99991111 with grade 1 diastolic dysfunction - Restart Coreg, BP now stable, patient was placed on vasopressor during this admission due to hypotension, BP has been stable now    Hypertension -Currently BP stable, Coreg was restarted   CKD (chronic kidney disease), stage III - Currently stable   Acute respiratory failure secondary to altered mental status from profound diabetic ketoacidosis - Currently stable, extubated on 11/19  Hypernatremia, hyperkalemia, high anion gap metabolic acidosis, diabetic ketoacidosis, lactic acidosis: resolved   Diabetic gastroparesis - Continue Reglan   SIRS (systemic inflammatory response syndrome) - Unclear source blood cultures negative, urine culture negative so far,  and sputum cultures negative, patient was started on vancomycin and Zosyn, subsequently transitioned to oral ciprofloxacin  - CT abdomen and pelvis ordered by CCM was negative for any abdominal pathology. Patient will finish the ciprofloxacin course for 5 days     Day of Discharge BP 126/56 mmHg  Pulse 76  Temp(Src) 97.5 F (36.4 C) (Oral)  Resp 16  Ht 5\' 5"  (1.651 m)  Wt 83.5 kg (184 lb 1.4 oz)  BMI 30.63 kg/m2  SpO2 100%  Physical Exam: General: Alert and awake oriented x3 not in any acute distress. CVS: S1-S2 clear no murmur rubs or gallops Chest: clear to auscultation bilaterally, no wheezing rales or rhonchi Abdomen: soft nontender, nondistended, normal bowel sounds Extremities: no cyanosis, clubbing or edema noted bilaterally Neuro: Cranial nerves II-XII intact, no focal neurological deficits   The results  of significant diagnostics from this hospitalization (including imaging, microbiology, ancillary and laboratory) are listed below for reference.    LAB RESULTS: Basic Metabolic Panel:  Recent Labs Lab 07/27/14 0201  07/28/14 0800 07/29/14 0500  NA 148*  < > 144 145  K 3.4*  < > 3.9 3.7  CL 101  < > 106 107  CO2 22  < > 23 26  GLUCOSE 195*  < > 206* 107*  BUN 19  < > 15 13  CREATININE 0.91  < > 1.25* 1.24*  CALCIUM 9.1  < > 8.1* 8.2*  MG 1.5  --   --   --   PHOS 1.0*  --   --   --   < > = values in this interval not displayed. Liver Function Tests:  Recent Labs Lab 07/26/14 1329 07/27/14 1000  AST 14 12  ALT 9 7  ALKPHOS 182* 135*  BILITOT 0.4 0.6  PROT 6.7 5.2*  ALBUMIN 3.0* 2.4*   No results for input(s): LIPASE, AMYLASE in the last 168 hours. No results for input(s): AMMONIA in the last 168 hours. CBC:  Recent Labs Lab 07/26/14 1329  07/27/14 0201 07/28/14 0405  WBC 16.5*  --  8.0 10.6*  NEUTROABS 13.3*  --   --   --   HGB 11.0*  < > 9.6* 9.7*  HCT 34.5*  < > 27.6* 30.1*  MCV 91.0  --  86.0 88.8  PLT 326  --  244 237  < > = values  in this interval not displayed. Cardiac Enzymes:  Recent Labs Lab 07/26/14 2053 07/27/14 0201  TROPONINI 1.08* 0.93*   BNP: Invalid input(s): POCBNP CBG:  Recent Labs Lab 07/30/14 2231 07/31/14 0744  GLUCAP 90 146*    Significant Diagnostic Studies:  Ct Head Wo Contrast  07/26/2014   CLINICAL DATA:  Altered mental status, hyperglycemia  EXAM: CT HEAD WITHOUT CONTRAST  TECHNIQUE: Contiguous axial images were obtained from the base of the skull through the vertex without intravenous contrast.  COMPARISON:  10/25/2013  FINDINGS: Mild atrophy.  Normal ventricular morphology.  No midline shift or mass effect.  Stable calcification adjacent to inner table of LEFT frontal bone since earlier study of 05/26/2007.  No intracranial hemorrhage, mass lesion or evidence acute infarction.  No extra-axial fluid collections.  Atherosclerotic calcifications at the carotid siphons bilaterally.  Bones and sinuses otherwise unremarkable.  IMPRESSION: No acute intracranial abnormalities.  Stable LEFT frontal extra-axial calcification since 2008.   Electronically Signed   By: Lavonia Dana M.D.   On: 07/26/2014 16:49   Dg Chest Port 1 View  07/27/2014   CLINICAL DATA:  63 year old female with respiratory failure. Intubated. Initial encounter.  EXAM: PORTABLE CHEST - 1 VIEW  COMPARISON:  07/26/2014 and earlier.  FINDINGS: Portable AP semi upright view at 0513 hr. Stable endotracheal tube tip at the level the clavicles. Enteric tube courses to the left abdomen as before. Stable left IJ central line. Normal cardiac size and mediastinal contours. Sequelae of CABG. Interval regressed streaky left lung base opacity. Allowing for portable technique, the lungs are clear.  IMPRESSION: 1.  Stable lines and tubes. 2. Interval regressed atelectasis. No acute cardiopulmonary abnormality.   Electronically Signed   By: Lars Pinks M.D.   On: 07/27/2014 06:40   Dg Chest Portable 1 View  07/26/2014   CLINICAL DATA:  63 year old  female central line placement. Unresponsive. Initial encounter.  EXAM: PORTABLE CHEST - 1 VIEW  COMPARISON:  1400 hr  the same day and earlier.  FINDINGS: Portable AP supine view at 1518 hrs. Endotracheal tube tip now at the level the clavicles. New left IJ approach central venous catheter, tip projects one vertebral body below the carina. Enteric tube courses to the left upper quadrant as before.  Stable cardiac size and mediastinal contours. Sequelae of CABG. Improved lung volumes. Except for streaky opacity at the left lung base the lungs appear clear. No pneumothorax.  IMPRESSION: 1. Left IJ central line placed, tip at the lower SVC level. No pneumothorax. 2. ETT tip now at the clavicles.  Stable enteric tube. 3. Streaky left lung base opacity, most resembles atelectasis.   Electronically Signed   By: Lars Pinks M.D.   On: 07/26/2014 15:43   Dg Chest Port 1 View  07/26/2014   CLINICAL DATA:  63 year old female intubated. Initial encounter.  EXAM: PORTABLE CHEST - 1 VIEW  COMPARISON:  11/10/2013 and earlier.  FINDINGS: Portable AP supine view at 1400 hrs. Endotracheal tube tip 2 cm above the carina. Resuscitation pads in place. Enteric tube courses to the left upper quadrant, side hole projects over the stomach. Lower lung volumes. Stable cardiac size and mediastinal contours. Sequelae of CABG. No pneumothorax, pulmonary edema, pleural effusion or consolidation identified.  IMPRESSION: 1. Intubated. ETT tip 2 cm above the carina. Enteric tube with side hole projecting over the stomach. 2. Lower lung volumes, no acute cardiopulmonary abnormality.   Electronically Signed   By: Lars Pinks M.D.   On: 07/26/2014 14:20   Dg Abd Portable 1v  07/27/2014   CLINICAL DATA:  Encounter for enteric feeding tube placement.  EXAM: PORTABLE ABDOMEN - 1 VIEW  COMPARISON:  None.  FINDINGS: Enteric tube passes below the diaphragm. Tip projects in the mid to distal stomach.  Normal bowel gas pattern.  IMPRESSION: Enteric tube  tip lies in the mid to distal stomach.   Electronically Signed   By: Lajean Manes M.D.   On: 07/27/2014 11:42       Disposition and Follow-up: Discharge Instructions    Diet Carb Modified    Complete by:  As directed      Discharge instructions    Complete by:  As directed   It is VERY IMPORTANT that you follow up with a PCP on a regular basis.  Check your blood glucoses before each meal and at bedtime and maintain a log of your readings.  Bring this log with you when you follow up with your PCP so that he or she can adjust your insulin at your follow up visit.  SICK DAY PLAN: Make a sick-day plan with your diabetes specialist. This may include a special meal plan for when you are having trouble keeping food down. Most medications won't have an effect on your diabetes, but some will. When you get sick: 1) Always take your diabetes medication especially the long acting insulin. If you are having trouble keeping the medicines down, call your doctor. 2) Check your blood-glucose level at least four times a day. If you are too sick to test it yourself, have someone else do it. Write down your levels in case you need to call your doctor. 3) Check for ketones when your blood glucose is 250 or higher. Write down levels in case you need to call your doctor. 4) Stick to your normal meal plan, if possible. Drink lots of sugar-free liquids, to prevent dehydration 5) If you cannot take your medicines, call your doctor and discuss whether you  need to adjust your insulin dose.     Increase activity slowly    Complete by:  As directed             DISPOSITION: Home  DIET: Carb modified diet     DISCHARGE FOLLOW-UP Follow-up Information    Follow up with KERR,JEFFREY, MD. Call on 08/01/2014.   Specialty:  Endocrinology   Why:  for hospital follow-up this week, please call tomorrow for follow-up appointment asap. Discuss "SICK DAY PLANNING" with Dr Buddy Duty.    Contact information:   [address] [place] [address] [phone]       Follow up with [name], MD. Schedule an appointment as soon as possible for a visit in 2 weeks.   Specialty:  Family Medicine   Why:  for hospital follow-up   Contact information:   [address] [place] [address] [phone]       Time spent on Discharge: 45 mins  Signed:   RAI,RIPUDEEP M.D. [facility] 07/31/2014, 11:03 AM Pager: [phone]

## 2014-07-31 NOTE — Progress Notes (Signed)
Physical Therapy Treatment Patient Details Name: Bridget Martinez MRN: [id number] DOB: 02-03-1951 Today's Date: 07/31/2014    History of Present Illness      PT Comments    Pt progressing well.  Ambulated in hallway with min guard assist 250 feet.  Plan is for d/c home today.  She will have 24-hour family assist.  She has RW at home.  She is declining HHPT.  Follow Up Recommendations   (pt does not want HHPT)     Equipment Recommendations  None recommended by PT    Recommendations for Other Services       Precautions / Restrictions Precautions Precautions: Fall    Mobility  Bed Mobility Overal bed mobility: Modified Independent                Transfers   Equipment used: None   Sit to Stand: Min guard Stand pivot transfers: Min guard       General transfer comment: verbal cues for safety  Ambulation/Gait Ambulation/Gait assistance: Min guard Ambulation Distance (Feet): 250 Feet Assistive device: Rolling walker (2 wheeled) Gait Pattern/deviations: Decreased stride length;Steppage Gait velocity: decreased   General Gait Details: Ambulated in room without A.D. 30 feet with min guard assist; RW used for hallway ambulation   Stairs            Wheelchair Mobility    Modified Rankin (Stroke Patients Only)       Balance Overall balance assessment: Needs assistance Sitting-balance support: No upper extremity supported;Feet supported Sitting balance-Leahy Scale: Good     Standing balance support: No upper extremity supported;During functional activity Standing balance-Leahy Scale: Fair                      Cognition Arousal/Alertness: Awake/alert Behavior During Therapy: WFL for tasks assessed/performed Overall Cognitive Status: Within Functional Limits for tasks assessed                      Exercises      General Comments        Pertinent Vitals/Pain Pain Assessment: No/denies pain    Home Living                      Prior Function            PT Goals (current goals can now be found in the care plan section) Progress towards PT goals: Progressing toward goals    Frequency  Min 3X/week    PT Plan Discharge plan needs to be updated    Co-evaluation             End of Session Equipment Utilized During Treatment: Gait belt Activity Tolerance: Patient tolerated treatment well Patient left: in chair;with call bell/phone within reach     Time: 0813-0837 PT Time Calculation (min) (ACUTE ONLY): 24 min  Charges:  $Gait Training: 23-37 mins                    G Codes:      Lorriane Shire 07/31/2014, 8:39 AM

## 2014-08-01 LAB — CULTURE, BLOOD (ROUTINE X 2)
CULTURE: NO GROWTH
Culture: NO GROWTH

## 2014-08-17 ENCOUNTER — Encounter (HOSPITAL_COMMUNITY): Payer: Self-pay | Admitting: Cardiology

## 2014-08-18 ENCOUNTER — Encounter (HOSPITAL_COMMUNITY): Payer: Self-pay | Admitting: Cardiology

## 2014-09-15 ENCOUNTER — Inpatient Hospital Stay (HOSPITAL_COMMUNITY): Payer: BC Managed Care – PPO

## 2014-09-15 ENCOUNTER — Encounter (HOSPITAL_COMMUNITY): Payer: Self-pay | Admitting: Emergency Medicine

## 2014-09-15 ENCOUNTER — Inpatient Hospital Stay (HOSPITAL_COMMUNITY)
Admission: EM | Admit: 2014-09-15 | Discharge: 2014-09-21 | DRG: 871 | Disposition: A | Discharge status: Home / Self Care | Payer: BC Managed Care – PPO | Attending: Internal Medicine | Admitting: Internal Medicine

## 2014-09-15 ENCOUNTER — Emergency Department (HOSPITAL_COMMUNITY): Payer: BC Managed Care – PPO

## 2014-09-15 DIAGNOSIS — Z8611 Personal history of tuberculosis: Secondary | ICD-10-CM

## 2014-09-15 DIAGNOSIS — J9690 Respiratory failure, unspecified, unspecified whether with hypoxia or hypercapnia: Secondary | ICD-10-CM

## 2014-09-15 DIAGNOSIS — K3184 Gastroparesis: Secondary | ICD-10-CM | POA: Diagnosis present

## 2014-09-15 DIAGNOSIS — E1110 Type 2 diabetes mellitus with ketoacidosis without coma: Secondary | ICD-10-CM | POA: Diagnosis present

## 2014-09-15 DIAGNOSIS — Z882 Allergy status to sulfonamides status: Secondary | ICD-10-CM | POA: Diagnosis not present

## 2014-09-15 DIAGNOSIS — Z951 Presence of aortocoronary bypass graft: Secondary | ICD-10-CM

## 2014-09-15 DIAGNOSIS — J9600 Acute respiratory failure, unspecified whether with hypoxia or hypercapnia: Secondary | ICD-10-CM | POA: Diagnosis present

## 2014-09-15 DIAGNOSIS — M1990 Unspecified osteoarthritis, unspecified site: Secondary | ICD-10-CM | POA: Diagnosis present

## 2014-09-15 DIAGNOSIS — N179 Acute kidney failure, unspecified: Secondary | ICD-10-CM | POA: Diagnosis present

## 2014-09-15 DIAGNOSIS — Z01818 Encounter for other preprocedural examination: Secondary | ICD-10-CM

## 2014-09-15 DIAGNOSIS — Z794 Long term (current) use of insulin: Secondary | ICD-10-CM

## 2014-09-15 DIAGNOSIS — I252 Old myocardial infarction: Secondary | ICD-10-CM

## 2014-09-15 DIAGNOSIS — M810 Age-related osteoporosis without current pathological fracture: Secondary | ICD-10-CM | POA: Diagnosis present

## 2014-09-15 DIAGNOSIS — E1311 Other specified diabetes mellitus with ketoacidosis with coma: Secondary | ICD-10-CM

## 2014-09-15 DIAGNOSIS — E876 Hypokalemia: Secondary | ICD-10-CM | POA: Diagnosis present

## 2014-09-15 DIAGNOSIS — E785 Hyperlipidemia, unspecified: Secondary | ICD-10-CM | POA: Diagnosis present

## 2014-09-15 DIAGNOSIS — I5042 Chronic combined systolic (congestive) and diastolic (congestive) heart failure: Secondary | ICD-10-CM | POA: Diagnosis present

## 2014-09-15 DIAGNOSIS — E1143 Type 2 diabetes mellitus with diabetic autonomic (poly)neuropathy: Secondary | ICD-10-CM | POA: Diagnosis present

## 2014-09-15 DIAGNOSIS — I447 Left bundle-branch block, unspecified: Secondary | ICD-10-CM | POA: Diagnosis present

## 2014-09-15 DIAGNOSIS — E1021 Type 1 diabetes mellitus with diabetic nephropathy: Secondary | ICD-10-CM | POA: Diagnosis present

## 2014-09-15 DIAGNOSIS — Z9114 Patient's other noncompliance with medication regimen: Secondary | ICD-10-CM | POA: Diagnosis present

## 2014-09-15 DIAGNOSIS — N183 Chronic kidney disease, stage 3 (moderate): Secondary | ICD-10-CM | POA: Diagnosis present

## 2014-09-15 DIAGNOSIS — A419 Sepsis, unspecified organism: Principal | ICD-10-CM | POA: Diagnosis present

## 2014-09-15 DIAGNOSIS — E10649 Type 1 diabetes mellitus with hypoglycemia without coma: Secondary | ICD-10-CM | POA: Diagnosis present

## 2014-09-15 DIAGNOSIS — K219 Gastro-esophageal reflux disease without esophagitis: Secondary | ICD-10-CM | POA: Diagnosis present

## 2014-09-15 DIAGNOSIS — D649 Anemia, unspecified: Secondary | ICD-10-CM | POA: Diagnosis present

## 2014-09-15 DIAGNOSIS — I129 Hypertensive chronic kidney disease with stage 1 through stage 4 chronic kidney disease, or unspecified chronic kidney disease: Secondary | ICD-10-CM | POA: Diagnosis present

## 2014-09-15 DIAGNOSIS — E1065 Type 1 diabetes mellitus with hyperglycemia: Secondary | ICD-10-CM | POA: Diagnosis present

## 2014-09-15 DIAGNOSIS — E039 Hypothyroidism, unspecified: Secondary | ICD-10-CM | POA: Diagnosis present

## 2014-09-15 DIAGNOSIS — G9340 Encephalopathy, unspecified: Secondary | ICD-10-CM | POA: Diagnosis present

## 2014-09-15 DIAGNOSIS — E872 Acidosis: Secondary | ICD-10-CM | POA: Diagnosis present

## 2014-09-15 DIAGNOSIS — L02416 Cutaneous abscess of left lower limb: Secondary | ICD-10-CM | POA: Diagnosis present

## 2014-09-15 DIAGNOSIS — I2510 Atherosclerotic heart disease of native coronary artery without angina pectoris: Secondary | ICD-10-CM | POA: Diagnosis present

## 2014-09-15 DIAGNOSIS — F329 Major depressive disorder, single episode, unspecified: Secondary | ICD-10-CM | POA: Diagnosis present

## 2014-09-15 DIAGNOSIS — F419 Anxiety disorder, unspecified: Secondary | ICD-10-CM | POA: Diagnosis present

## 2014-09-15 DIAGNOSIS — E162 Hypoglycemia, unspecified: Secondary | ICD-10-CM | POA: Diagnosis present

## 2014-09-15 DIAGNOSIS — Z8701 Personal history of pneumonia (recurrent): Secondary | ICD-10-CM

## 2014-09-15 DIAGNOSIS — R6521 Severe sepsis with septic shock: Secondary | ICD-10-CM | POA: Diagnosis present

## 2014-09-15 DIAGNOSIS — E870 Hyperosmolality and hypernatremia: Secondary | ICD-10-CM | POA: Diagnosis present

## 2014-09-15 DIAGNOSIS — I509 Heart failure, unspecified: Secondary | ICD-10-CM | POA: Diagnosis present

## 2014-09-15 DIAGNOSIS — IMO0001 Reserved for inherently not codable concepts without codable children: Secondary | ICD-10-CM | POA: Diagnosis present

## 2014-09-15 DIAGNOSIS — E1011 Type 1 diabetes mellitus with ketoacidosis with coma: Secondary | ICD-10-CM | POA: Diagnosis present

## 2014-09-15 DIAGNOSIS — Z7982 Long term (current) use of aspirin: Secondary | ICD-10-CM

## 2014-09-15 DIAGNOSIS — Z7902 Long term (current) use of antithrombotics/antiplatelets: Secondary | ICD-10-CM

## 2014-09-15 DIAGNOSIS — E875 Hyperkalemia: Secondary | ICD-10-CM | POA: Diagnosis present

## 2014-09-15 DIAGNOSIS — R6510 Systemic inflammatory response syndrome (SIRS) of non-infectious origin without acute organ dysfunction: Secondary | ICD-10-CM | POA: Diagnosis present

## 2014-09-15 DIAGNOSIS — N184 Chronic kidney disease, stage 4 (severe): Secondary | ICD-10-CM | POA: Diagnosis present

## 2014-09-15 DIAGNOSIS — E1043 Type 1 diabetes mellitus with diabetic autonomic (poly)neuropathy: Secondary | ICD-10-CM | POA: Diagnosis present

## 2014-09-15 DIAGNOSIS — E1049 Type 1 diabetes mellitus with other diabetic neurological complication: Secondary | ICD-10-CM | POA: Diagnosis present

## 2014-09-15 DIAGNOSIS — E0811 Diabetes mellitus due to underlying condition with ketoacidosis with coma: Secondary | ICD-10-CM

## 2014-09-15 DIAGNOSIS — IMO0002 Reserved for concepts with insufficient information to code with codable children: Secondary | ICD-10-CM | POA: Diagnosis present

## 2014-09-15 DIAGNOSIS — R739 Hyperglycemia, unspecified: Secondary | ICD-10-CM | POA: Diagnosis present

## 2014-09-15 LAB — BASIC METABOLIC PANEL
Anion gap: 21 — ABNORMAL HIGH (ref 5–15)
BUN: 20 mg/dL (ref 6–23)
CALCIUM: 7.7 mg/dL — AB (ref 8.4–10.5)
CO2: 6 mmol/L — CL (ref 19–32)
CREATININE: 1.72 mg/dL — AB (ref 0.50–1.10)
Chloride: 115 mEq/L — ABNORMAL HIGH (ref 96–112)
GFR calc Af Amer: 35 mL/min — ABNORMAL LOW (ref >90)
GFR calc non Af Amer: 30 mL/min — ABNORMAL LOW (ref >90)
GLUCOSE: 231 mg/dL — AB (ref 70–99)
Potassium: 3.3 mmol/L — ABNORMAL LOW (ref 3.5–5.1)
Sodium: 142 mmol/L (ref 135–145)

## 2014-09-15 LAB — CBC WITH DIFFERENTIAL/PLATELET
Basophils Absolute: 0 10*3/uL (ref 0.0–0.1)
Basophils Relative: 0 % (ref 0–1)
EOS PCT: 0 % (ref 0–5)
Eosinophils Absolute: 0 10*3/uL (ref 0.0–0.7)
HCT: 30.6 % — ABNORMAL LOW (ref 36.0–46.0)
Hemoglobin: 9.7 g/dL — ABNORMAL LOW (ref 12.0–15.0)
LYMPHS ABS: 1.1 10*3/uL (ref 0.7–4.0)
Lymphocytes Relative: 9 % — ABNORMAL LOW (ref 12–46)
MCH: 29.8 pg (ref 26.0–34.0)
MCHC: 31.7 g/dL (ref 30.0–36.0)
MCV: 93.9 fL (ref 78.0–100.0)
MONOS PCT: 6 % (ref 3–12)
Monocytes Absolute: 0.8 10*3/uL (ref 0.1–1.0)
NEUTROS PCT: 85 % — AB (ref 43–77)
Neutro Abs: 10.6 10*3/uL — ABNORMAL HIGH (ref 1.7–7.7)
Platelets: 293 10*3/uL (ref 150–400)
RBC: 3.26 MIL/uL — ABNORMAL LOW (ref 3.87–5.11)
RDW: 14.2 % (ref 11.5–15.5)
WBC: 12.5 10*3/uL — ABNORMAL HIGH (ref 4.0–10.5)

## 2014-09-15 LAB — I-STAT ARTERIAL BLOOD GAS, ED
Acid-base deficit: 28 mmol/L — ABNORMAL HIGH (ref 0.0–2.0)
Acid-base deficit: 27 mmol/L — ABNORMAL HIGH (ref 0.0–2.0)
Bicarbonate: 2.8 mEq/L — ABNORMAL LOW (ref 20.0–24.0)
Bicarbonate: 3.8 mEq/L — ABNORMAL LOW (ref 20.0–24.0)
O2 SAT: 97 %
O2 Saturation: 99 %
PCO2 ART: 13.2 mmHg — AB (ref 35.0–45.0)
PH ART: 6.972 — AB (ref 7.350–7.450)
PO2 ART: 139 mmHg — AB (ref 80.0–100.0)
Patient temperature: 98.6
Patient temperature: 34.6
TCO2: <5 mmol/L (ref 0–100)
TCO2: <5 mmol/L (ref 0–100)
pCO2 arterial: 15.9 mmHg — CL (ref 35.0–45.0)
pH, Arterial: 6.932 — CL (ref 7.350–7.450)
pO2, Arterial: 192 mmHg — ABNORMAL HIGH (ref 80.0–100.0)

## 2014-09-15 LAB — COMPREHENSIVE METABOLIC PANEL
ALT: 22 U/L (ref 0–35)
AST: 16 U/L (ref 0–37)
Albumin: 3 g/dL — ABNORMAL LOW (ref 3.5–5.2)
Alkaline Phosphatase: 161 U/L — ABNORMAL HIGH (ref 39–117)
Anion gap: 29 — ABNORMAL HIGH (ref 5–15)
BUN: 26 mg/dL — AB (ref 6–23)
CO2: 6 mmol/L — AB (ref 19–32)
CREATININE: 2.16 mg/dL — AB (ref 0.50–1.10)
Calcium: 8 mg/dL — ABNORMAL LOW (ref 8.4–10.5)
Chloride: 101 mEq/L (ref 96–112)
GFR calc Af Amer: 27 mL/min — ABNORMAL LOW (ref >90)
GFR, EST NON AFRICAN AMERICAN: 23 mL/min — AB (ref >90)
Glucose, Bld: 833 mg/dL (ref 70–99)
Potassium: 6 mmol/L — ABNORMAL HIGH (ref 3.5–5.1)
Sodium: 136 mmol/L (ref 135–145)
Total Bilirubin: 1.7 mg/dL — ABNORMAL HIGH (ref 0.3–1.2)
Total Protein: 5.7 g/dL — ABNORMAL LOW (ref 6.0–8.3)

## 2014-09-15 LAB — CBC
HCT: 31.8 % — ABNORMAL LOW (ref 36.0–46.0)
Hemoglobin: 9.9 g/dL — ABNORMAL LOW (ref 12.0–15.0)
MCH: 29.9 pg (ref 26.0–34.0)
MCHC: 31.1 g/dL (ref 30.0–36.0)
MCV: 96.1 fL (ref 78.0–100.0)
PLATELETS: 329 10*3/uL (ref 150–400)
RBC: 3.31 MIL/uL — AB (ref 3.87–5.11)
RDW: 14 % (ref 11.5–15.5)
WBC: 16 10*3/uL — ABNORMAL HIGH (ref 4.0–10.5)

## 2014-09-15 LAB — PROTIME-INR
INR: 1.31 (ref 0.00–1.49)
Prothrombin Time: 16.4 seconds — ABNORMAL HIGH (ref 11.6–15.2)

## 2014-09-15 LAB — I-STAT TROPONIN, ED: Troponin i, poc: 0 ng/mL (ref 0.00–0.08)

## 2014-09-15 LAB — GLUCOSE, CAPILLARY: Glucose-Capillary: 331 mg/dL — ABNORMAL HIGH (ref 70–99)

## 2014-09-15 LAB — CBG MONITORING, ED
GLUCOSE-CAPILLARY: 395 mg/dL — AB (ref 70–99)
GLUCOSE-CAPILLARY: 421 mg/dL — AB (ref 70–99)
Glucose-Capillary: >600 mg/dL (ref 70–99)
Glucose-Capillary: 494 mg/dL — ABNORMAL HIGH (ref 70–99)

## 2014-09-15 LAB — URINALYSIS, ROUTINE W REFLEX MICROSCOPIC
Bilirubin Urine: NEGATIVE
Glucose, UA: >1000 mg/dL — AB
HGB URINE DIPSTICK: NEGATIVE
Leukocytes, UA: NEGATIVE
Nitrite: NEGATIVE
Protein, ur: NEGATIVE mg/dL
Specific Gravity, Urine: 1.023 (ref 1.005–1.030)
Urobilinogen, UA: 0.2 mg/dL (ref 0.0–1.0)
pH: 5 (ref 5.0–8.0)

## 2014-09-15 LAB — URINE MICROSCOPIC-ADD ON

## 2014-09-15 LAB — LACTIC ACID, PLASMA: LACTIC ACID, VENOUS: 2.7 mmol/L — AB (ref 0.5–2.2)

## 2014-09-15 LAB — TROPONIN I: Troponin I: 0.03 ng/mL (ref <0.031)

## 2014-09-15 LAB — KETONES, QUALITATIVE

## 2014-09-15 LAB — I-STAT CG4 LACTIC ACID, ED: Lactic Acid, Venous: 1.75 mmol/L (ref 0.5–2.2)

## 2014-09-15 MED ORDER — LIDOCAINE-EPINEPHRINE (PF) 2 %-1:200000 IJ SOLN
10.0000 mL | Freq: Once | INTRAMUSCULAR | Status: AC
  Administered 2014-09-15: 10 mL via INTRADERMAL
  Filled 2014-09-15: qty 20

## 2014-09-15 MED ORDER — SODIUM CHLORIDE 0.9 % IV BOLUS (SEPSIS)
1000.0000 mL | INTRAVENOUS | Status: AC
  Administered 2014-09-15: 50 mL via INTRAVENOUS

## 2014-09-15 MED ORDER — INSULIN ASPART 100 UNIT/ML IV SOLN
10.0000 [IU] | Freq: Once | INTRAVENOUS | Status: AC
  Administered 2014-09-15: 10 [IU] via INTRAVENOUS
  Filled 2014-09-15: qty 1

## 2014-09-15 MED ORDER — PIPERACILLIN-TAZOBACTAM 3.375 G IVPB
3.3750 g | Freq: Once | INTRAVENOUS | Status: AC
Start: 2014-09-15 — End: 2014-09-15
  Administered 2014-09-15: 3.375 g via INTRAVENOUS
  Filled 2014-09-15: qty 50

## 2014-09-15 MED ORDER — SODIUM CHLORIDE 0.9 % IV SOLN
INTRAVENOUS | Status: DC
  Administered 2014-09-15: 4.3 [IU]/h via INTRAVENOUS
  Administered 2014-09-16: 6.9 [IU]/h via INTRAVENOUS
  Administered 2014-09-16: 1 [IU]/h via INTRAVENOUS
  Administered 2014-09-16: 6.8 [IU]/h via INTRAVENOUS
  Filled 2014-09-15: qty 2.5

## 2014-09-15 MED ORDER — SODIUM CHLORIDE 0.9 % IV SOLN
25.0000 ug/h | INTRAVENOUS | Status: DC
  Administered 2014-09-15: 50 ug/h via INTRAVENOUS
  Filled 2014-09-15 (×2): qty 50

## 2014-09-15 MED ORDER — FENTANYL BOLUS VIA INFUSION
50.0000 ug | INTRAVENOUS | Status: DC | PRN
Start: 2014-09-15 — End: 2014-09-17
  Filled 2014-09-15: qty 50

## 2014-09-15 MED ORDER — CETYLPYRIDINIUM CHLORIDE 0.05 % MT LIQD
7.0000 mL | Freq: Four times a day (QID) | OROMUCOSAL | Status: DC
  Administered 2014-09-16 – 2014-09-17 (×7): 7 mL via OROMUCOSAL

## 2014-09-15 MED ORDER — PHENYLEPHRINE HCL 10 MG/ML IJ SOLN
20.0000 ug/min | INTRAVENOUS | Status: DC
  Filled 2014-09-15: qty 1

## 2014-09-15 MED ORDER — SODIUM BICARBONATE 8.4 % IV SOLN
100.0000 meq | Freq: Once | INTRAVENOUS | Status: AC
  Administered 2014-09-15: 100 meq via INTRAVENOUS
  Filled 2014-09-15: qty 100

## 2014-09-15 MED ORDER — SODIUM CHLORIDE 0.9 % IV BOLUS (SEPSIS)
1000.0000 mL | Freq: Once | INTRAVENOUS | Status: AC
  Administered 2014-09-15: 1000 mL via INTRAVENOUS

## 2014-09-15 MED ORDER — PHENYLEPHRINE HCL 10 MG/ML IJ SOLN
20.0000 ug/min | INTRAVENOUS | Status: DC
  Administered 2014-09-16: 20 ug/min via INTRAVENOUS
  Administered 2014-09-16: 30 ug/min via INTRAVENOUS

## 2014-09-15 MED ORDER — MIDAZOLAM HCL 2 MG/2ML IJ SOLN
INTRAMUSCULAR | Status: AC
  Administered 2014-09-15: 2 mg
  Filled 2014-09-15: qty 2

## 2014-09-15 MED ORDER — SODIUM CHLORIDE 0.9 % IV SOLN: INTRAVENOUS | Status: DC

## 2014-09-15 MED ORDER — SODIUM CHLORIDE 0.9 % IV SOLN
1750.0000 mg | Freq: Once | INTRAVENOUS | Status: AC
  Administered 2014-09-15: 1750 mg via INTRAVENOUS
  Filled 2014-09-15: qty 1750

## 2014-09-15 MED ORDER — PIPERACILLIN-TAZOBACTAM 3.375 G IVPB
3.3750 g | Freq: Three times a day (TID) | INTRAVENOUS | Status: DC
  Administered 2014-09-16 – 2014-09-18 (×7): 3.375 g via INTRAVENOUS
  Filled 2014-09-15 (×11): qty 50

## 2014-09-15 MED ORDER — ROCURONIUM BROMIDE 50 MG/5ML IV SOLN
INTRAVENOUS | Status: AC
Start: 2014-09-15 — End: 2014-09-16
  Filled 2014-09-15: qty 2

## 2014-09-15 MED ORDER — DEXTROSE-NACL 5-0.45 % IV SOLN
INTRAVENOUS | Status: DC
  Administered 2014-09-16: 09:00:00 via INTRAVENOUS

## 2014-09-15 MED ORDER — ETOMIDATE 2 MG/ML IV SOLN
INTRAVENOUS | Status: AC
  Administered 2014-09-15: 10 mg
  Filled 2014-09-15: qty 20

## 2014-09-15 MED ORDER — PHENYLEPHRINE HCL 10 MG/ML IJ SOLN
30.0000 ug/min | INTRAMUSCULAR | Status: DC
  Administered 2014-09-15: 30 ug/min via INTRAVENOUS
  Filled 2014-09-15 (×3): qty 1

## 2014-09-15 MED ORDER — FENTANYL CITRATE 0.05 MG/ML IJ SOLN
50.0000 ug | Freq: Once | INTRAMUSCULAR | Status: AC
Start: 2014-09-15 — End: 2014-09-15
  Administered 2014-09-15: 100 ug via INTRAVENOUS

## 2014-09-15 MED ORDER — HEPARIN SODIUM (PORCINE) 5000 UNIT/ML IJ SOLN
5000.0000 [IU] | Freq: Three times a day (TID) | INTRAMUSCULAR | Status: DC
  Administered 2014-09-16 – 2014-09-17 (×4): 5000 [IU] via SUBCUTANEOUS
  Filled 2014-09-15 (×7): qty 1

## 2014-09-15 MED ORDER — PANTOPRAZOLE SODIUM 40 MG IV SOLR
40.0000 mg | Freq: Every day | INTRAVENOUS | Status: DC
  Administered 2014-09-15 – 2014-09-17 (×3): 40 mg via INTRAVENOUS
  Filled 2014-09-15 (×3): qty 40

## 2014-09-15 MED ORDER — ALBUTEROL (5 MG/ML) CONTINUOUS INHALATION SOLN: 10.0000 mg/h | INHALATION_SOLUTION | RESPIRATORY_TRACT | Status: DC

## 2014-09-15 MED ORDER — ALBUTEROL SULFATE (2.5 MG/3ML) 0.083% IN NEBU
10.0000 mg | INHALATION_SOLUTION | Freq: Once | RESPIRATORY_TRACT | Status: DC
  Administered 2014-09-15: 10 mg via RESPIRATORY_TRACT
  Filled 2014-09-15: qty 12

## 2014-09-15 MED ORDER — SUCCINYLCHOLINE CHLORIDE 20 MG/ML IJ SOLN
INTRAMUSCULAR | Status: AC
Start: 2014-09-15 — End: 2014-09-16
  Filled 2014-09-15: qty 1

## 2014-09-15 MED ORDER — CHLORHEXIDINE GLUCONATE 0.12 % MT SOLN
15.0000 mL | Freq: Two times a day (BID) | OROMUCOSAL | Status: DC
  Administered 2014-09-16 – 2014-09-17 (×3): 15 mL via OROMUCOSAL
  Filled 2014-09-15 (×5): qty 15

## 2014-09-15 MED ORDER — SODIUM CHLORIDE 0.9 % IV SOLN
1.0000 g | Freq: Once | INTRAVENOUS | Status: AC
  Administered 2014-09-15: 1 g via INTRAVENOUS
  Filled 2014-09-15: qty 10

## 2014-09-15 MED ORDER — STERILE WATER FOR INJECTION IV SOLN
INTRAVENOUS | Status: DC
  Administered 2014-09-15: 21:00:00 via INTRAVENOUS
  Filled 2014-09-15: qty 850

## 2014-09-15 MED ORDER — LEVOTHYROXINE SODIUM 100 MCG IV SOLR
62.5000 ug | Freq: Every day | INTRAVENOUS | Status: DC
  Administered 2014-09-16 – 2014-09-17 (×2): 62.5 ug via INTRAVENOUS
  Filled 2014-09-15 (×3): qty 5

## 2014-09-15 MED ORDER — PIPERACILLIN-TAZOBACTAM 4.5 G IVPB: 4.5000 g | Freq: Once | INTRAVENOUS | Status: DC

## 2014-09-15 MED ORDER — FENTANYL CITRATE 0.05 MG/ML IJ SOLN
INTRAMUSCULAR | Status: AC
  Filled 2014-09-15: qty 2

## 2014-09-15 MED ORDER — LIDOCAINE HCL (CARDIAC) 20 MG/ML IV SOLN
INTRAVENOUS | Status: AC
  Filled 2014-09-15: qty 5

## 2014-09-15 MED ORDER — VANCOMYCIN HCL IN DEXTROSE 1-5 GM/200ML-% IV SOLN
1000.0000 mg | Freq: Two times a day (BID) | INTRAVENOUS | Status: DC
  Administered 2014-09-16: 1000 mg via INTRAVENOUS
  Filled 2014-09-15 (×2): qty 200

## 2014-09-15 NOTE — ED Notes (Signed)
Checked patient cbg it read high greater than 600 notified Rn of blood sugar

## 2014-09-15 NOTE — Procedures (Signed)
Intubation Procedure Note RAQUEL LEMERISE [id number] [birth year]  Procedure: Intubation Indications: Airway protection and maintenance  Procedure Details Consent: Risks of procedure as well as the alternatives and risks of each were explained to the (patient/caregiver).  Consent for procedure obtained. Time Out: Verified patient identification, verified procedure, site/side was marked, verified correct patient position, special equipment/implants available, medications/allergies/relevent history reviewed, required imaging and test results available.  Performed  Drugs 166mcg Fentanyl, 2mg  Versed, 20mg  Etomidate. Indirect laryngoscopy using glidescope with # 3 MAC blade. 7.5 tube passed through cords under direct visualization.  Evaluation Hemodynamic Status: BP stable throughout; O2 sats: stable throughout Patient's Current Condition: stable Complications: No apparent complications Patient did tolerate procedure well. Chest X-ray ordered to verify placement.  CXR: pending.  I performed by myself  [name]. Titus Mould, MD, [place] Pgr: [facility]

## 2014-09-15 NOTE — Procedures (Signed)
Arterial Catheter Insertion Procedure Note Bridget Martinez [id number] 26-Dec-1950  Procedure: Insertion of Arterial Catheter  Indications: Blood pressure monitoring  Procedure Details Consent: Risks of procedure as well as the alternatives and risks of each were explained to the (patient/caregiver).  Consent for procedure obtained. Time Out: Verified patient identification, verified procedure, site/side was marked, verified correct patient position, special equipment/implants available, medications/allergies/relevent history reviewed, required imaging and test results available.  Performed  Maximum sterile technique was used including antiseptics, cap, gloves, gown, hand hygiene, mask and sheet. Skin prep: Chlorhexidine; local anesthetic administered 20 gauge catheter was inserted into right radial artery using the Seldinger technique.  Evaluation Blood flow good; BP tracing good. Complications: No apparent complications.   Signed:  Jessee Avers, MD PGY-3 Internal Medicine Teaching Service Pager: [phone] 09/15/2014, 11:54 PM    Merton Border, MD ; [facility] [phone].  After 5:30 PM or weekends, call [phone]

## 2014-09-15 NOTE — ED Notes (Signed)
Continuous Neb treatment stopped at [place] per Dr. Titus Mould

## 2014-09-15 NOTE — ED Provider Notes (Signed)
Dr. Ralene Bathe requested a I&D procedure to abscess/cellulitis of L medial thigh.  INCISION AND DRAINAGE Performed by: Domenic Moras Consent: Verbal consent obtained. Risks and benefits: risks, benefits and alternatives were discussed Type: abscess  Body area: L medial thigh  Anesthesia: local infiltration  Incision was made with a scalpel.  Local anesthetic: lidocaine 2% w epinephrine  Anesthetic total: 3 ml  Complexity: complex Blunt dissection to break up loculations  Drainage: purulent  Drainage amount: minimal  Packing material: 1/4 in iodoform gauze  Patient tolerance: Pt was intubated, no complication from procedure.     Domenic Moras, PA-C 09/15/14 [place], MD 09/16/14 0030

## 2014-09-15 NOTE — Progress Notes (Addendum)
ANTIBIOTIC CONSULT NOTE - INITIAL  Pharmacy Consult for Vancomycin  Indication: rule out sepsis  Allergies  Allergen Reactions  . Sulfa Antibiotics Anaphylaxis and Swelling    Stiff neck also     Patient Measurements:  Vital Signs: BP: 108/65 mmHg (01/07 1710) Pulse Rate: 64 (01/07 1710) Intake/Output from previous day:   Intake/Output from this shift:    Labs: No results for input(s): WBC, HGB, PLT, LABCREA, CREATININE in the last 72 hours. CrCl cannot be calculated (Unknown ideal weight.). No results for input(s): VANCOTROUGH, VANCOPEAK, VANCORANDOM, GENTTROUGH, GENTPEAK, GENTRANDOM, TOBRATROUGH, TOBRAPEAK, TOBRARND, AMIKACINPEAK, AMIKACINTROU, AMIKACIN in the last 72 hours.   Microbiology: No results found for this or any previous visit (from the past 720 hour(s)).  Medical History: Past Medical History  Diagnosis Date  . Diabetes mellitus     on insulin, with h/o DKA   . HTN (hypertension)   . Hyperlipidemia   . Venous stasis ulcers   . Arthritis   . Depression     anxiety  . 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 Hypertension   . Coronary artery disease     angina.  MI.   . Pneumonia        . GERD (gastroesophageal reflux disease)     Hiatal hernia.   . Hypothyroidism   . CHF (congestive heart failure)   . Tuberculosis   . Osteoporosis     Medications:  Scheduled:  . albuterol  10 mg Nebulization Once  . insulin aspart  10 Units Intravenous Once   Infusions:  . calcium gluconate 1 GM IV    . piperacillin-tazobactam (ZOSYN)  IV    . sodium chloride    . sodium chloride    . vancomycin    . [START ON 09/16/2014] vancomycin     Assessment: 14 YOF presenting to [facility] on 09/15/14 c/o high blood sugar. Pharmacy as been consulted to dose vancomycin to r/o sepsis and possible necrotizing fascitis.  LA 1.75, pH 6.932, pCO2 13.2, bicarb 2.8.  HR 107, RR 17.  Based on creatinine from November (Scr 1.24), estimated  Crcl ~65-70 mL/min.  Patient received Zosyn 3.375 g IV x 1.    Goal of Therapy:  Vancomycin trough level 15-20 mcg/ml  Plan:  - Vancomycin 1750 IV x 1  - Vancomycin 1000 mg IV Q12H - Zosyn 3.375 g IV Q8H - Follow up renal function and cultures - Monitor for clinical efficacy and trough levels when appropriate  1/8 SCr is trending down to 1.43. CrCl ~38ml/min.   Change vancomycin to 750mg  IV Q12 Monitor renal function, clinical picture, VT over the weekend F/U abx deescalation and LOT

## 2014-09-15 NOTE — H&P (Signed)
PULMONARY / CRITICAL CARE MEDICINE   Name: Bridget Martinez MRN: [id number] DOB: 09/20/1950    ADMISSION DATE:  09/15/2014 CONSULTATION DATE:  09/15/2014  REFERRING MD :  ED  CHIEF COMPLAINT:  Hyperglycemia  INITIAL PRESENTATION: 64 year old woman with history of DM1, HTN, CAD, GERD, hypothyroidism, CHF presenting with DKA and requiring intubation in ED.  STUDIES:  CXR 1/7 >> cardiomegaly, no PTX, no focal infiltrate, pulmonary edema or pleural effusion.  SIGNIFICANT EVENTS: 1/7 >> Intubated, L medial upper thigh abscess s/p bedside I&D.  HISTORY OF PRESENT ILLNESS:  64 year old woman with history of DM2, HTN, CAD, GERD, hypothyroidism, CHF presenting with DKA. Found confused at home with hyperglycemia. Received 1.5L NS en route to hospital. In the ED found to be hyperglycemic with acidosis. Intubated. she was found to have a L medial upper thigh abscess that was I&D'd at bedside. Started on IVF, insulin gtt, and bicarb given.  Recently hospitalized 07/26/2014 to 07/31/2014 for severe DKA with acute respiratory failure and admission to ICU.  PAST MEDICAL HISTORY :   has a past medical history of Diabetes mellitus; HTN (hypertension); Hyperlipidemia; Venous stasis ulcers; Arthritis; Depression; Myocardial infarction; Hypertension; Coronary artery disease; Pneumonia; GERD (gastroesophageal reflux disease); Hypothyroidism; CHF (congestive heart failure); Tuberculosis; and Osteoporosis.  has past surgical history that includes Cardiac catheterization; Tonsillectomy; Coronary artery bypass graft; Coronary artery bypass graft (2012); Esophagogastroduodenoscopy (N/A, 11/17/2013); Colonoscopy (N/A, 11/18/2013); right heart catheterization (N/A, 07/29/2011); and left heart catheterization with coronary angiogram (N/A, 07/26/2011). Prior to Admission medications   Medication Sig Start Date End Date Taking? Authorizing Provider  alendronate (FOSAMAX) 70 MG tablet Take 70 mg by mouth once a week.  11/04/13    Historical Provider, MD  aspirin EC 81 MG tablet Take 1 tablet (81 mg total) by mouth daily. 06/15/14   Rande Brunt, NP  carvedilol (COREG) 6.25 MG tablet Take 1 tablet (6.25 mg total) by mouth 2 (two) times daily with a meal. 10/29/13   Melton Alar, PA-C  ciprofloxacin (CIPRO) 500 MG tablet Take 1 tablet (500 mg total) by mouth 2 (two) times daily. X 3 more days 07/31/14   Ripudeep Krystal Eaton, MD  clopidogrel (PLAVIX) 75 MG tablet Take 1 tablet (75 mg total) by mouth daily with breakfast. 01/12/14   Lysbeth Penner, FNP  escitalopram (LEXAPRO) 10 MG tablet Take 1 tablet (10 mg total) by mouth daily. 09/27/13   Lysbeth Penner, FNP  furosemide (LASIX) 40 MG tablet Take 1 tablet (40 mg total) by mouth daily. 10/29/13   Bobby Rumpf [place], PA-C  hyoscyamine (LEVSIN, ANASPAZ) 0.125 MG tablet Take 0.125 mg by mouth every 4 (four) hours as needed for bladder spasms.    Historical Provider, MD  insulin aspart (NOVOLOG) 100 UNIT/ML injection Inject 100 Units into the skin 3 (three) times daily with meals. 15 units at breakfast, 20 units at lunch, 20 units supper Plus 1 unit for every 66m/dL above 100 mg/dL 11/30/13   MMelton Alar PA-C  insulin detemir (LEVEMIR) 100 UNIT/ML injection Inject 14 Units into the skin 2 (two) times daily.  11/30/13   MMelton Alar PA-C  levothyroxine (SYNTHROID, LEVOTHROID) 125 MCG tablet Take 1 tablet (125 mcg total) by mouth daily. 09/27/13   WLysbeth Penner FNP  metoCLOPramide (REGLAN) 10 MG tablet Take 1 tablet (10 mg total) by mouth 4 (four) times daily -  before meals and at bedtime. 11/19/13   EKelvin Cellar MD  ondansetron (ZOFRAN ODT) 8 MG disintegrating tablet Take  1 tablet (8 mg total) by mouth every 8 (eight) hours as needed for nausea or vomiting. 11/10/13   Lysbeth Penner, FNP  pantoprazole (PROTONIX) 40 MG tablet Take 1 tablet (40 mg total) by mouth daily. 07/31/14 03/30/17  Ripudeep Krystal Eaton, MD  rosuvastatin (CRESTOR) 40 MG tablet Take 1 tablet (40 mg total) by  mouth daily. 09/27/13   Lysbeth Penner, FNP  spironolactone (ALDACTONE) 25 MG tablet Take 12.5 mg by mouth daily.    Historical Provider, MD  Vitamin D, Ergocalciferol, (DRISDOL) 50000 UNITS CAPS capsule Take 1 capsule by mouth once a week. On saturday 09/29/13   Historical Provider, MD   Allergies  Allergen Reactions  . Sulfa Antibiotics Anaphylaxis and Swelling    Stiff neck also     FAMILY HISTORY:  indicated that her mother is deceased. She indicated that her father is deceased. She indicated that her maternal grandmother is deceased. She indicated that her maternal grandfather is deceased. She indicated that her paternal grandmother is deceased. She indicated that her paternal grandfather is deceased.  SOCIAL HISTORY:  reports that she has never smoked. She does not have any smokeless tobacco history on file. She reports that she does not drink alcohol or use illicit drugs.  REVIEW OF SYSTEMS:  Unable to obtain 2/2 mental status  SUBJECTIVE: Intubated, sedated  VITAL SIGNS: Temp:  [93.9 F (34.4 C)-94.8 F (34.9 C)] 93.9 F (34.4 C) (01/07 1915) Pulse Rate:  [64-107] 91 (01/07 1915) Resp:  [16-24] 18 (01/07 1915) BP: (91-112)/(31-65) 107/32 mmHg (01/07 1915) SpO2:  [100 %] 100 % (01/07 1915) FiO2 (%):  [40 %] 40 % (01/07 1845) HEMODYNAMICS:   VENTILATOR SETTINGS: Vent Mode:  [-] PRVC FiO2 (%):  [40 %] 40 % Set Rate:  [35 bmp] 35 bmp Vt Set:  [420 mL-480 mL] 420 mL PEEP:  [5 cmH20] 5 cmH20 Plateau Pressure:  [24 cmH20] 24 cmH20 INTAKE / OUTPUT:  Intake/Output Summary (Last 24 hours) at 09/15/14 1943 Last data filed at 09/15/14 1825  Gross per 24 hour  Intake   3000 ml  Output      0 ml  Net   3000 ml    PHYSICAL EXAMINATION: General:  NAD Neuro:  Sedated rass - 3 HEENT:  [place]/AT, PERRL, ETT in place Cardiovascular:  RRR Lungs:  Mechanical breath sounds bilaterally Abdomen:  Soft, nondistended Musculoskeletal:  No LE edema Skin:  R upper medial thigh s/p I&D  with packing in place, surrounding erythema, no fluctuance  LABS:  CBC  Recent Labs Lab 09/15/14 1800  WBC 12.5*  HGB 9.7*  HCT 30.6*  PLT 293   Coag's  Recent Labs Lab 09/15/14 1800  INR 1.31   BMET  Recent Labs Lab 09/15/14 1800  NA 136  K 6.0*  CL 101  CO2 6*  BUN 26*  CREATININE 2.16*  GLUCOSE 833*   Electrolytes  Recent Labs Lab 09/15/14 1800  CALCIUM 8.0*   Sepsis Markers  Recent Labs Lab 09/15/14 1727  LATICACIDVEN 1.75   ABG  Recent Labs Lab 09/15/14 1715  PHART 6.932*  PCO2ART 13.2*  PO2ART 139.0*   Liver Enzymes  Recent Labs Lab 09/15/14 1800  AST 16  ALT 22  ALKPHOS 161*  BILITOT 1.7*  ALBUMIN 3.0*   Cardiac Enzymes No results for input(s): TROPONINI, PROBNP in the last 168 hours. Glucose  Recent Labs Lab 09/15/14 1659  GLUCAP >600*    Imaging No results found.   ASSESSMENT / PLAN:  PULMONARY  ETT 1/7 >> A: Acute respiratory failure 2/2 AMS, uncompensated met acidosis, type 4 resp failure - airway protection P:   Full vent support Daily WUA/SBTs in am if acidosis ph resolving VAP prevention per protocol Follow ABGs, rate to 35 prior NO ROLE BICARB DRIP, will harm, unless for hyperk  CARDIOVASCULAR CVL RIJ 1/7 >> A:  CAD s/p CAGB 2013 CHF - Echo 07/2014 with EF 03-49%, grade 1 diastolic dysfunction Hypotension- hypovolemia, septic shock? Lactic acidosis mild , less 4 New LBBB from most recent EKG - r/o ischemia vs hyperk induced P:  Neo gtt titrate MAP goal of 65 Recheck lactic acid Trend troponin Tele IVF EKG in AM Treating hyperK consider EGDT, 30 cc/kg (received)  RENAL A:   CKD stage 3 Hyperkalemia mild (pre vent) Metabolic acidosis with increased AG Foley in place  P:   IVF hydration per DKA protocol BMP Q 2 hr Bicarb gtt until K resolved, once k less 6 , dc  GASTROINTESTINAL A:  GERD P:   NPO SUP: IV protonix  HEMATOLOGIC A:  No acute issues VTE ppx P:  subq  hep SCDs Follow CBC  INFECTIOUS A:  Left thigh abscess P:   Wound care consult  BCx2 1/7 >> UC 1/7 >>   Abx:  Zosyn, start date 1/7, day 1 Vanc, start date 1/7, day 1  EDP will drain and culture  ENDOCRINE A:   Severe DKA  DM Hypothyroid P:   DKA protocol IVF, insulin gtt Continue synthroid  NEUROLOGIC A:  Acute encephalopathy P:   RASS goal: -1 fent gtt, fent prn bolus If not improved neurostatus after metabolic correction then consider CT head   FAMILY  - Updates: Husband at bedside  - Inter-disciplinary family meet or Palliative Care meeting due by:  1/14  Jacques Earthly, MD  Pulmonary and [place] Pager: [phone]  09/15/2014, 7:43 PM   STAFF NOTE: Linwood Dibbles, MD FACP have personally reviewed patient's available data, including medical history, events of note, physical examination and test results as part of my evaluation. I have discussed with resident/NP and other care providers such as pharmacist, RN and RRT. In addition, I personally evaluated patient and elicited key findings of: Hypovolemic shock primary, r/o septic, start EGDT, more volume cvp less 10, insulin drip, dc bicarb ASAP as K resolved, abg to follow, ABX< I&D abscess, unimpressive overall, LBBB from K? trop The patient is critically ill with multiple organ systems failure and requires high complexity decision making for assessment and support, frequent evaluation and titration of therapies, application of advanced monitoring technologies and extensive interpretation of multiple databases.   Critical Care Time devoted to patient care services described in this note is40 Minutes. This time reflects time of care of this signee: Merrie Roof, MD FACP. This critical care time does not reflect procedure time, or teaching time or supervisory time of PA/NP/Med student/Med Resident etc but could involve care discussion time. Rest per NP/medical  resident whose note is outlined above and that I agree with   [name]. Titus Mould, MD, [place] Pgr: [facility] 09/15/2014 8:51 PM

## 2014-09-15 NOTE — ED Notes (Signed)
Pt here from home with c/o high blood sugar , Per EMS  Husband came home and found wife with aloc , EMS gave 1500 of fluid and found blood sugar to be greater than 600

## 2014-09-15 NOTE — ED Provider Notes (Signed)
CSN: [id number]     Arrival date & time 09/15/14  1654 History   First MD Initiated Contact with Patient 09/15/14 1701     Chief Complaint  Patient presents with  . Hyperglycemia      Patient is a 64 y.o. female presenting with hyperglycemia.  Hyperglycemia  [name] presents for hyperglycemia and altered mental status. Complete history not available due to altered mental status. Level V caveat. Patient brought in by EMS for altered mental status she was found unresponsive at home with a critical high blood sugar. She received 1500 mL normal saline by EMS prior to arrival. Blood sugars read critical high for them. Patient is unable to provide any history. Hx provided by EMS.   Past Medical History  Diagnosis Date  . Diabetes mellitus     on insulin, with h/o DKA   . HTN (hypertension)   . Hyperlipidemia   . Venous stasis ulcers   . Arthritis   . Depression     anxiety  . 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 Hypertension   . Coronary artery disease     angina.  MI.   . Pneumonia        . GERD (gastroesophageal reflux disease)     Hiatal hernia.   . Hypothyroidism   . CHF (congestive heart failure)   . Tuberculosis   . Osteoporosis    Past Surgical History  Procedure Laterality Date  . Cardiac catheterization    . Tonsillectomy    . Coronary artery bypass graft      Dr. Prescott Gum in 09/2011   . Coronary artery bypass graft  2012  . Esophagogastroduodenoscopy N/A 11/17/2013    Procedure: ESOPHAGOGASTRODUODENOSCOPY (EGD);  Surgeon: Lafayette Dragon, MD;  Location: [facility] ENDOSCOPY;  Service: Endoscopy;  Laterality: N/A;  . Colonoscopy N/A 11/18/2013    Procedure: COLONOSCOPY;  Surgeon: Lafayette Dragon, MD;  Location: [facility] ENDOSCOPY;  Service: Endoscopy;  Laterality: N/A;  . Right heart catheterization N/A 07/29/2011    Procedure: RIGHT HEART CATH;  Surgeon: Minus Breeding, MD;  Location: [facility] CATH LAB;  Service: Cardiovascular;   Laterality: N/A;  . Left heart catheterization with coronary angiogram N/A 07/26/2011    Procedure: LEFT HEART CATHETERIZATION WITH CORONARY ANGIOGRAM;  Surgeon: Larey Dresser, MD;  Location: [facility] CATH LAB;  Service: Cardiovascular;  Laterality: N/A;   Family History  Problem Relation Age of Onset  . Heart disease Father   . Multiple sclerosis Father   . Hypertension Mother   . Hyperthyroidism Mother   . Diabetes Cousin     Multiple maternal cousins with type 2 diabetes mellitus  . Diabetes Maternal Uncle     Type 1 diabetes mellitus  . Heart attack Paternal Grandfather   . Heart disease Paternal Grandfather    History  Substance Use Topics  . Smoking status: Never Smoker   . Smokeless tobacco: Not on file  . Alcohol Use: No   OB History    No data available     Review of Systems  Unable to perform ROS     Allergies  Sulfa antibiotics  Home Medications   Prior to Admission medications   Medication Sig Start Date End Date Taking? Authorizing Provider  alendronate (FOSAMAX) 70 MG tablet Take 70 mg by mouth once a week.  11/04/13   Historical Provider, MD  aspirin EC 81 MG tablet Take 1 tablet (81 mg total) by mouth  daily. 06/15/14   Rande Brunt, NP  carvedilol (COREG) 6.25 MG tablet Take 1 tablet (6.25 mg total) by mouth 2 (two) times daily with a meal. 10/29/13   Melton Alar, PA-C  ciprofloxacin (CIPRO) 500 MG tablet Take 1 tablet (500 mg total) by mouth 2 (two) times daily. X 3 more days 07/31/14   Ripudeep Krystal Eaton, MD  clopidogrel (PLAVIX) 75 MG tablet Take 1 tablet (75 mg total) by mouth daily with breakfast. 01/12/14   Lysbeth Penner, FNP  escitalopram (LEXAPRO) 10 MG tablet Take 1 tablet (10 mg total) by mouth daily. 09/27/13   Lysbeth Penner, FNP  furosemide (LASIX) 40 MG tablet Take 1 tablet (40 mg total) by mouth daily. 10/29/13   Bobby Rumpf York, PA-C  hyoscyamine (LEVSIN, ANASPAZ) 0.125 MG tablet Take 0.125 mg by mouth every 4 (four) hours as needed for  bladder spasms.    Historical Provider, MD  insulin aspart (NOVOLOG) 100 UNIT/ML injection Inject 100 Units into the skin 3 (three) times daily with meals. 15 units at breakfast, 20 units at lunch, 20 units supper Plus 1 unit for every 35mg /dL above 100 mg/dL 11/30/13   Bobby Rumpf York, PA-C  insulin detemir (LEVEMIR) 100 UNIT/ML injection Inject 14 Units into the skin 2 (two) times daily.  11/30/13   Melton Alar, PA-C  levothyroxine (SYNTHROID, LEVOTHROID) 125 MCG tablet Take 1 tablet (125 mcg total) by mouth daily. 09/27/13   Lysbeth Penner, FNP  metoCLOPramide (REGLAN) 10 MG tablet Take 1 tablet (10 mg total) by mouth 4 (four) times daily -  before meals and at bedtime. 11/19/13   Kelvin Cellar, MD  ondansetron (ZOFRAN ODT) 8 MG disintegrating tablet Take 1 tablet (8 mg total) by mouth every 8 (eight) hours as needed for nausea or vomiting. 11/10/13   Lysbeth Penner, FNP  pantoprazole (PROTONIX) 40 MG tablet Take 1 tablet (40 mg total) by mouth daily. 07/31/14 03/30/17  Ripudeep Krystal Eaton, MD  rosuvastatin (CRESTOR) 40 MG tablet Take 1 tablet (40 mg total) by mouth daily. 09/27/13   Lysbeth Penner, FNP  spironolactone (ALDACTONE) 25 MG tablet Take 12.5 mg by mouth daily.    Historical Provider, MD  Vitamin D, Ergocalciferol, (DRISDOL) 50000 UNITS CAPS capsule Take 1 capsule by mouth once a week. On saturday 09/29/13   Historical Provider, MD   BP 108/65 mmHg  Pulse 64  Resp 24  SpO2 100% Physical Exam  Constitutional: She appears distressed.  Ill appearing  HENT:  Head: Atraumatic.  Dry mucous membranes  Eyes: Pupils are equal, round, and reactive to light.  Cataract to right pupil  Cardiovascular:  Tachcardic, no murmur  Pulmonary/Chest:  tachypneic with good air movement bilaterally  Abdominal: Soft. There is no tenderness. There is no rebound and no guarding.  Musculoskeletal: She exhibits no tenderness.  Spontaneously draining abscess in left upper thigh with local erythema   Neurological:  Lethargic, opens eyes to verbal stimuli, profound generalized weakness, dysarthric speech, conversant but confused  Skin: Skin is warm and dry.  Psychiatric:  Unable to assess  Nursing note and vitals reviewed.   ED Course  Procedures (including critical care time) CRITICAL CARE Performed by: Quintella Reichert   Total critical care time: 45 minutes  Critical care time was exclusive of separately billable procedures and treating other patients.  Critical care was necessary to treat or prevent imminent or life-threatening deterioration.  Critical care was time spent personally by me on the following  activities: development of treatment plan with patient and/or surrogate as well as nursing, discussions with consultants, evaluation of patient's response to treatment, examination of patient, obtaining history from patient or surrogate, ordering and performing treatments and interventions, ordering and review of laboratory studies, ordering and review of radiographic studies, pulse oximetry and re-evaluation of patient's condition.  Labs Review Labs Reviewed  CBC WITH DIFFERENTIAL - Abnormal; Notable for the following:    WBC 12.5 (*)    RBC 3.26 (*)    Hemoglobin 9.7 (*)    HCT 30.6 (*)    Neutrophils Relative % 85 (*)    Lymphocytes Relative 9 (*)    Neutro Abs 10.6 (*)    All other components within normal limits  COMPREHENSIVE METABOLIC PANEL - Abnormal; Notable for the following:    Potassium 6.0 (*)    CO2 6 (*)    Glucose, Bld 833 (*)    BUN 26 (*)    Creatinine, Ser 2.16 (*)    Calcium 8.0 (*)    Total Protein 5.7 (*)    Albumin 3.0 (*)    Alkaline Phosphatase 161 (*)    Total Bilirubin 1.7 (*)    GFR calc non Af Amer 23 (*)    GFR calc Af Amer 27 (*)    Anion gap 29 (*)    All other components within normal limits  PROTIME-INR - Abnormal; Notable for the following:    Prothrombin Time 16.4 (*)    All other components within normal limits   URINALYSIS, ROUTINE W REFLEX MICROSCOPIC - Abnormal; Notable for the following:    APPearance CLOUDY (*)    Glucose, UA >1000 (*)    Ketones, ur >80 (*)    All other components within normal limits  KETONES, QUALITATIVE - Abnormal; Notable for the following:    Acetone, Bld MODERATE (*)    All other components within normal limits  CBC - Abnormal; Notable for the following:    WBC 16.0 (*)    RBC 3.31 (*)    Hemoglobin 9.9 (*)    HCT 31.8 (*)    All other components within normal limits  LACTIC ACID, PLASMA - Abnormal; Notable for the following:    Lactic Acid, Venous 2.7 (*)    All other components within normal limits  URINE MICROSCOPIC-ADD ON - Abnormal; Notable for the following:    Bacteria, UA FEW (*)    All other components within normal limits  BASIC METABOLIC PANEL - Abnormal; Notable for the following:    Potassium 3.3 (*)    Chloride 115 (*)    CO2 6 (*)    Glucose, Bld 231 (*)    Creatinine, Ser 1.72 (*)    Calcium 7.7 (*)    GFR calc non Af Amer 30 (*)    GFR calc Af Amer 35 (*)    Anion gap 21 (*)    All other components within normal limits  LACTIC ACID, PLASMA - Abnormal; Notable for the following:    Lactic Acid, Venous 6.4 (*)    All other components within normal limits  GLUCOSE, CAPILLARY - Abnormal; Notable for the following:    Glucose-Capillary 331 (*)    All other components within normal limits  CBG MONITORING, ED - Abnormal; Notable for the following:    Glucose-Capillary >600 (*)    All other components within normal limits  I-STAT ARTERIAL BLOOD GAS, ED - Abnormal; Notable for the following:    pH, Arterial 6.932 (*)  pCO2 arterial 13.2 (*)    pO2, Arterial 139.0 (*)    Bicarbonate 2.8 (*)    Acid-base deficit 28.0 (*)    All other components within normal limits  CBG MONITORING, ED - Abnormal; Notable for the following:    Glucose-Capillary 494 (*)    All other components within normal limits  I-STAT ARTERIAL BLOOD GAS, ED - Abnormal;  Notable for the following:    pH, Arterial 6.972 (*)    pCO2 arterial 15.9 (*)    pO2, Arterial 192.0 (*)    Bicarbonate 3.8 (*)    Acid-base deficit 27.0 (*)    All other components within normal limits  CBG MONITORING, ED - Abnormal; Notable for the following:    Glucose-Capillary 395 (*)    All other components within normal limits  CBG MONITORING, ED - Abnormal; Notable for the following:    Glucose-Capillary 421 (*)    All other components within normal limits  CULTURE, BLOOD (ROUTINE X 2)  CULTURE, BLOOD (ROUTINE X 2)  URINE CULTURE  MRSA PCR SCREENING  TROPONIN I  BLOOD GAS, ARTERIAL  BASIC METABOLIC PANEL  BASIC METABOLIC PANEL  TROPONIN I  TROPONIN I  BASIC METABOLIC PANEL  TROPONIN I  CARBOXYHEMOGLOBIN  I-STAT CHEM 8, ED  I-STAT CG4 LACTIC ACID, ED  I-STAT TROPOININ, ED    Imaging Review Portable Chest Xray  09/15/2014   CLINICAL DATA:  Assess for endotracheal tube and central line placement.  EXAM: PORTABLE CHEST - 1 VIEW  COMPARISON:  None.  FINDINGS: The heart size is enlarged. Mediastinal contour is normal. Endotracheal tube is identified with distal tip probably in the carina. Retraction by 4 cm is recommended. Nasogastric tube is identified with distal tip not included on the film but is at least in the stomach. A left jugular central venous line is identified with distal tip is. Neck cava. There is no pneumothorax. There is no focal infiltrate, pulmonary edema, or pleural effusion. The osseous structures are stable.  IMPRESSION: Endotracheal tube with distal tip probably in the carina. Retraction by 4 cm is recommended.  Left jugular central venous line in good position. There is no pneumothorax. Nasogastric tube is identified with distal tip not included on the film but is at least in the stomach.  These results will be called to the ordering clinician or representative by the Radiologist Assistant, and communication documented in the PACS or zVision Dashboard.    Electronically Signed   By: Abelardo Diesel M.D.   On: 09/15/2014 19:18   Dg Chest Port 1 View  09/15/2014   CLINICAL DATA:  Diabetes. Hyperglycemia. Elevated blood sugar. Blood sugar of 600.  EXAM: PORTABLE CHEST - 1 VIEW  COMPARISON:  07/29/2014.  FINDINGS: Low volume chest. CABG. Monitoring leads project over the chest. Patient is rotated to the RIGHT. Mild basilar atelectasis. No focal consolidation. Cardiopericardial silhouette is within normal limits for low volumes.  IMPRESSION: Low volume chest.   Electronically Signed   By: Dereck Ligas M.D.   On: 09/15/2014 17:29     EKG Interpretation   Date/Time:  Thursday September 15 2014 17:03:18 EST Ventricular Rate:  99 PR Interval:  164 QRS Duration: 144 QT Interval:  400 QTC Calculation: 513 R Axis:   -75 Text Interpretation:  Age not entered, assumed to be  64 years old for  purpose of ECG interpretation Sinus rhythm Left bundle branch block  Baseline wander in lead(s) V4 Confirmed by Hazle Coca [phone]) on 09/15/2014  5:35:17  PM      MDM   Final diagnoses:  Diabetic ketoacidosis with coma associated with other specified diabetes mellitus  Abscess of left thigh  Sepsis, due to unspecified organism    Patient brought in for evaluation of altered mental status. Per EMS report patient was found unresponsive. After ED arrival patient's family was available for additional history. They state that she was in her routine state of health this morning. When her husband returned from work she was found sitting in a chair with altered level consciousness staring into space. She was speaking but did not make any sense. On initial laboratory evaluation patient had profound metabolic acidosis with pH of 6.9. EKG showed new left bundle-branch block. There is difficulty obtaining i-STAT electrolytes. There is concern for hyperkalemia. Patient was given sodium bicarbonate as well as calcium gluconate and albuterol nebulizer was started. Patient was  treated with empiric antibiotics due to concern for Sepsis with the Abscess in Her Left Groin.critical care was contacted for assistance in management.    Quintella Reichert, MD 09/16/14 [phone]

## 2014-09-15 NOTE — ED Notes (Signed)
Placed size 14 french temp foley into patient yellow clear urine in return

## 2014-09-15 NOTE — Procedures (Signed)
Central Venous Catheter Insertion Procedure Note Bridget Martinez [id number] Mar 26, 1951  Procedure: Insertion of Central Venous Catheter Indications: Assessment of intravascular volume, Drug and/or fluid administration and Frequent blood sampling  Procedure Details Consent: Risks of procedure as well as the alternatives and risks of each were explained to the (patient/caregiver).  Consent for procedure obtained. Time Out: Verified patient identification, verified procedure, site/side was marked, verified correct patient position, special equipment/implants available, medications/allergies/relevent history reviewed, required imaging and test results available.  Performed  Maximum sterile technique was used including antiseptics, cap, gloves, gown, hand hygiene, mask and sheet. Skin prep: Chlorhexidine; local anesthetic administered A antimicrobial bonded/coated triple lumen catheter was placed in the left internal jugular vein using the Seldinger technique.  Evaluation Blood flow good Complications: No apparent complications Patient did tolerate procedure well. Chest X-ray ordered to verify placement.  CXR: pending.  Procedure performed under direct ultrasound guidance for real time vessel cannulation.     i performed  US  Daniel J. Titus Mould, MD, [place] Pgr: [facility]

## 2014-09-15 NOTE — ED Notes (Signed)
I was unable to get a complete I-stat Chem-8 result x2 using same sample on two different machines.  The Dr. Albertina Parr and Edwena Bunde were notified of results but told that they may not be entirely accurate.

## 2014-09-15 NOTE — ED Notes (Signed)
Critical values reported to Dr Titus Mould

## 2014-09-16 LAB — BASIC METABOLIC PANEL
ANION GAP: 17 — AB (ref 5–15)
ANION GAP: 13 (ref 5–15)
Anion gap: 16 — ABNORMAL HIGH (ref 5–15)
Anion gap: 11 (ref 5–15)
Anion gap: 11 (ref 5–15)
BUN: 18 mg/dL (ref 6–23)
BUN: 18 mg/dL (ref 6–23)
BUN: 13 mg/dL (ref 6–23)
BUN: 11 mg/dL (ref 6–23)
BUN: 8 mg/dL (ref 6–23)
CHLORIDE: 112 meq/L (ref 96–112)
CO2: 12 mmol/L — AB (ref 19–32)
CO2: 10 mmol/L — AB (ref 19–32)
CO2: 14 mmol/L — AB (ref 19–32)
CO2: 15 mmol/L — ABNORMAL LOW (ref 19–32)
CO2: 17 mmol/L — ABNORMAL LOW (ref 19–32)
CREATININE: 1.52 mg/dL — AB (ref 0.50–1.10)
CREATININE: 1.62 mg/dL — AB (ref 0.50–1.10)
CREATININE: 1.43 mg/dL — AB (ref 0.50–1.10)
CREATININE: 1.14 mg/dL — AB (ref 0.50–1.10)
Calcium: 7.7 mg/dL — ABNORMAL LOW (ref 8.4–10.5)
Calcium: 7.6 mg/dL — ABNORMAL LOW (ref 8.4–10.5)
Calcium: 7.6 mg/dL — ABNORMAL LOW (ref 8.4–10.5)
Calcium: 7.5 mg/dL — ABNORMAL LOW (ref 8.4–10.5)
Calcium: 7.6 mg/dL — ABNORMAL LOW (ref 8.4–10.5)
Chloride: 114 mEq/L — ABNORMAL HIGH (ref 96–112)
Chloride: 113 mEq/L — ABNORMAL HIGH (ref 96–112)
Chloride: 117 mEq/L — ABNORMAL HIGH (ref 96–112)
Chloride: 112 mEq/L (ref 96–112)
Creatinine, Ser: 1.18 mg/dL — ABNORMAL HIGH (ref 0.50–1.10)
GFR calc Af Amer: 38 mL/min — ABNORMAL LOW (ref >90)
GFR calc Af Amer: 58 mL/min — ABNORMAL LOW (ref >90)
GFR calc non Af Amer: 35 mL/min — ABNORMAL LOW (ref >90)
GFR calc non Af Amer: 33 mL/min — ABNORMAL LOW (ref >90)
GFR calc non Af Amer: 38 mL/min — ABNORMAL LOW (ref >90)
GFR calc non Af Amer: 50 mL/min — ABNORMAL LOW (ref >90)
GFR, EST AFRICAN AMERICAN: 41 mL/min — AB (ref >90)
GFR, EST AFRICAN AMERICAN: 44 mL/min — AB (ref >90)
GFR, EST AFRICAN AMERICAN: 55 mL/min — AB (ref >90)
GFR, EST NON AFRICAN AMERICAN: 48 mL/min — AB (ref >90)
GLUCOSE: 253 mg/dL — AB (ref 70–99)
GLUCOSE: 211 mg/dL — AB (ref 70–99)
Glucose, Bld: 266 mg/dL — ABNORMAL HIGH (ref 70–99)
Glucose, Bld: 110 mg/dL — ABNORMAL HIGH (ref 70–99)
Glucose, Bld: 104 mg/dL — ABNORMAL HIGH (ref 70–99)
POTASSIUM: 4.1 mmol/L (ref 3.5–5.1)
POTASSIUM: 3.6 mmol/L (ref 3.5–5.1)
Potassium: 2.7 mmol/L — CL (ref 3.5–5.1)
Potassium: 3.9 mmol/L (ref 3.5–5.1)
Potassium: 3.4 mmol/L — ABNORMAL LOW (ref 3.5–5.1)
SODIUM: 140 mmol/L (ref 135–145)
Sodium: 142 mmol/L (ref 135–145)
Sodium: 140 mmol/L (ref 135–145)
Sodium: 142 mmol/L (ref 135–145)
Sodium: 140 mmol/L (ref 135–145)

## 2014-09-16 LAB — CARBOXYHEMOGLOBIN
CARBOXYHEMOGLOBIN: 1.4 % (ref 0.5–1.5)
METHEMOGLOBIN: 0.8 % (ref 0.0–1.5)
O2 SAT: 77.6 %
TOTAL HEMOGLOBIN: 8.7 g/dL — AB (ref 12.0–16.0)

## 2014-09-16 LAB — MRSA PCR SCREENING: MRSA by PCR: NEGATIVE

## 2014-09-16 LAB — GLUCOSE, CAPILLARY
GLUCOSE-CAPILLARY: 164 mg/dL — AB (ref 70–99)
GLUCOSE-CAPILLARY: 231 mg/dL — AB (ref 70–99)
Glucose-Capillary: 112 mg/dL — ABNORMAL HIGH (ref 70–99)
Glucose-Capillary: 125 mg/dL — ABNORMAL HIGH (ref 70–99)
Glucose-Capillary: 159 mg/dL — ABNORMAL HIGH (ref 70–99)
Glucose-Capillary: 177 mg/dL — ABNORMAL HIGH (ref 70–99)
Glucose-Capillary: 189 mg/dL — ABNORMAL HIGH (ref 70–99)
Glucose-Capillary: 232 mg/dL — ABNORMAL HIGH (ref 70–99)
Glucose-Capillary: 256 mg/dL — ABNORMAL HIGH (ref 70–99)
Glucose-Capillary: 286 mg/dL — ABNORMAL HIGH (ref 70–99)

## 2014-09-16 LAB — POCT I-STAT 3, ART BLOOD GAS (G3+)
Acid-base deficit: 17 mmol/L — ABNORMAL HIGH (ref 0.0–2.0)
BICARBONATE: 8.7 meq/L — AB (ref 20.0–24.0)
O2 Saturation: 100 %
Patient temperature: 98.6
TCO2: 9 mmol/L (ref 0–100)
pCO2 arterial: 20 mmHg — ABNORMAL LOW (ref 35.0–45.0)
pH, Arterial: 7.245 — ABNORMAL LOW (ref 7.350–7.450)
pO2, Arterial: 202 mmHg — ABNORMAL HIGH (ref 80.0–100.0)

## 2014-09-16 LAB — URINE CULTURE
COLONY COUNT: NO GROWTH
Culture: NO GROWTH

## 2014-09-16 LAB — LACTIC ACID, PLASMA
Lactic Acid, Venous: 3.3 mmol/L — ABNORMAL HIGH (ref 0.5–2.2)
Lactic Acid, Venous: 6.4 mmol/L — ABNORMAL HIGH (ref 0.5–2.2)

## 2014-09-16 LAB — TROPONIN I
Troponin I: <0.03 ng/mL (ref <0.031)
Troponin I: 0.03 ng/mL (ref <0.031)

## 2014-09-16 MED ORDER — VITAL HIGH PROTEIN PO LIQD
1000.0000 mL | ORAL | Status: DC
  Filled 2014-09-16 (×2): qty 1000

## 2014-09-16 MED ORDER — POTASSIUM CHLORIDE 10 MEQ/50ML IV SOLN
10.0000 meq | INTRAVENOUS | Status: AC
  Administered 2014-09-16 (×2): 10 meq via INTRAVENOUS
  Filled 2014-09-16 (×2): qty 50

## 2014-09-16 MED ORDER — PRO-STAT SUGAR FREE PO LIQD
30.0000 mL | Freq: Two times a day (BID) | ORAL | Status: DC
Start: 2014-09-16 — End: 2014-09-17
  Administered 2014-09-16 – 2014-09-17 (×3): 30 mL
  Filled 2014-09-16 (×5): qty 30

## 2014-09-16 MED ORDER — VANCOMYCIN HCL IN DEXTROSE 750-5 MG/150ML-% IV SOLN
750.0000 mg | Freq: Two times a day (BID) | INTRAVENOUS | Status: DC
  Administered 2014-09-16 – 2014-09-17 (×2): 750 mg via INTRAVENOUS
  Filled 2014-09-16 (×3): qty 150

## 2014-09-16 MED ORDER — SODIUM CHLORIDE 0.45 % IV SOLN: INTRAVENOUS | Status: DC

## 2014-09-16 MED ORDER — HYDROCORTISONE SOD SUCCINATE 100 MG IJ SOLR
50.0000 mg | Freq: Four times a day (QID) | INTRAMUSCULAR | Status: DC
  Administered 2014-09-16 – 2014-09-17 (×5): 50 mg via INTRAVENOUS
  Filled 2014-09-16 (×9): qty 1

## 2014-09-16 MED ORDER — PHENYLEPHRINE HCL 10 MG/ML IJ SOLN
20.0000 ug/min | INTRAVENOUS | Status: DC
  Administered 2014-09-16: 30 ug/min via INTRAVENOUS
  Filled 2014-09-16: qty 4

## 2014-09-16 MED ORDER — INSULIN GLARGINE 100 UNIT/ML ~~LOC~~ SOLN
20.0000 [IU] | Freq: Every day | SUBCUTANEOUS | Status: DC
  Administered 2014-09-16: 20 [IU] via SUBCUTANEOUS
  Filled 2014-09-16 (×2): qty 0.2

## 2014-09-16 MED ORDER — INSULIN ASPART 100 UNIT/ML ~~LOC~~ SOLN
0.0000 [IU] | Freq: Three times a day (TID) | SUBCUTANEOUS | Status: DC
  Administered 2014-09-16: 2 [IU] via SUBCUTANEOUS

## 2014-09-16 MED ORDER — GLUCERNA 1.2 CAL PO LIQD
1000.0000 mL | ORAL | Status: DC
  Administered 2014-09-16: 1000 mL
  Filled 2014-09-16 (×4): qty 1000

## 2014-09-16 MED ORDER — SODIUM CHLORIDE 0.9 % IV BOLUS (SEPSIS)
1000.0000 mL | Freq: Once | INTRAVENOUS | Status: AC
  Administered 2014-09-16: 1000 mL via INTRAVENOUS

## 2014-09-16 MED ORDER — HYDROCORTISONE SOD SUCCINATE 100 MG IJ SOLR
50.0000 mg | Freq: Three times a day (TID) | INTRAMUSCULAR | Status: DC
  Filled 2014-09-16 (×3): qty 1

## 2014-09-16 MED ORDER — SODIUM CHLORIDE 0.45 % IV SOLN
INTRAVENOUS | Status: DC
  Administered 2014-09-16 – 2014-09-17 (×3): via INTRAVENOUS
  Filled 2014-09-16 (×5): qty 1000

## 2014-09-16 MED ORDER — POTASSIUM CHLORIDE 10 MEQ/50ML IV SOLN
10.0000 meq | INTRAVENOUS | Status: AC
  Administered 2014-09-16 (×3): 10 meq via INTRAVENOUS
  Filled 2014-09-16 (×3): qty 50

## 2014-09-16 MED ORDER — INSULIN GLARGINE 100 UNIT/ML ~~LOC~~ SOLN
20.0000 [IU] | Freq: Every day | SUBCUTANEOUS | Status: DC
  Filled 2014-09-16: qty 0.2

## 2014-09-16 MED ORDER — POTASSIUM CHLORIDE 10 MEQ/50ML IV SOLN
10.0000 meq | INTRAVENOUS | Status: AC
  Administered 2014-09-16 – 2014-09-17 (×4): 10 meq via INTRAVENOUS
  Filled 2014-09-16 (×4): qty 50

## 2014-09-16 NOTE — Progress Notes (Signed)
Inpatient Diabetes Program Recommendations  AACE/ADA: New Consensus Statement on Inpatient Glycemic Control (2013)  Target Ranges:  Prepandial:   less than 140 mg/dL      Peak postprandial:   less than 180 mg/dL (1-2 hours)      Critically ill patients:  140 - 180 mg/dL     Results for MARLET, MULNIX (MRN [id number]) as of 09/16/2014 12:26  Ref. Range 09/15/2014 18:00  Sodium Latest Range: 134-144 mmol/L 136  Potassium Latest Range: 3.5-5.1 mmol/L 6.0 (H)  Chloride Latest Range: 96-112 mEq/L 101  CO2 Latest Range: 19-32 mmol/L 6 (LL)  BUN Latest Range: 8-27 mg/dL 26 (H)  Creatinine Latest Range: 0.50-1.10 mg/dL 2.16 (H)  Calcium Latest Range: 8.4-10.5 mg/dL 8.0 (L)  GFR calc non Af Amer Latest Range: >90 mL/min 23 (L)  GFR calc Af Amer Latest Range: >90 mL/min 27 (L)  Glucose Latest Range: 70-99 mg/dL 833 (HH)  Anion gap Latest Range: 5-15  29 (H)    Admitted with DKA/ Left Thigh Abscess.  History of Type 1 DM, HTN, CAD, CHF.   Home DM Meds: Levemir 14 units bid        Novolog 15 units breakfast/ 20 units lunch/ 20 units dinner        Novolog 1 unit for every 35 mg/dl above target CBG of 100 mg/dl   Current Orders: IV Insulin Drip (transitioning off IV insulin drip to SQ this AM)       Lantus 20 units QHS       Novolog Sensitive SSI    **Note plans to transition patient off IV insulin drip this AM.  Concerned about orders since I noted that Lantus not scheduled to be given until tonight at 10pm.  Per records, patient has Type 1 DM.  If she does not receive basal insulin until tonight, concern that patient will relapse quickly back into DKA.  **Spoke to RN caring for patient today.  Relayed my concerns about the timing of the Lantus.  Asked RN to please call MD managing patient to re-clarify Lantus orders.  Asked RN to ask MD if it would be possible to start Lantus earlier today- possibly before 3pm.     MD- Please also consider changing Novolog Sensitive SSI orders to Q4  hours since patient is intubated at present.    Will follow [name] RN, MSN, CDE Diabetes Coordinator Inpatient Diabetes Program Team Pager: [phone] (8a-10p)

## 2014-09-16 NOTE — Progress Notes (Signed)
[facility] Progress Note Patient Name: Bridget Martinez DOB: 01-28-1951 MRN: [id number]   Date of Service  09/16/2014  HPI/Events of Note  Hypokalemia  eICU Interventions  Potassium replaced     Intervention Category Intermediate Interventions: Electrolyte abnormality - evaluation and management  DETERDING,ELIZABETH 09/16/2014, 11:10 PM

## 2014-09-16 NOTE — Progress Notes (Addendum)
PULMONARY / CRITICAL CARE MEDICINE   Name: Bridget Martinez MRN: [id number] DOB: Jun 14, 1951    ADMISSION DATE:  09/15/2014 CONSULTATION DATE:  09/15/2014  REFERRING MD :  ED  CHIEF COMPLAINT:  Hyperglycemia  INITIAL PRESENTATION: 64 year old woman with history of DM1, HTN, CAD, GERD, hypothyroidism, CHF presenting with DKA and requiring intubation in ED. Was started on insulin drip, IVF and bicarb.  STUDIES:  CXR 1/7 >> cardiomegaly, no PTX, no focal infiltrate, pulmonary edema or pleural effusion.  SIGNIFICANT EVENTS: 1/7 >> Intubated, L medial upper thigh abscess s/p bedside I&D.  REVIEW OF SYSTEMS:    SUBJECTIVE: Intubated, sedated  VITAL SIGNS: Temp:  [93.9 F (34.4 C)-98.5 F (36.9 C)] 98.3 F (36.8 C) (01/08 0600) Pulse Rate:  [64-107] 66 (01/08 0600) Resp:  [13-39] 35 (01/08 0600) BP: (88-132)/(28-65) 98/32 mmHg (01/08 0600) SpO2:  [100 %] 100 % (01/08 0600) Arterial Line BP: (77-149)/(26-50) 84/31 mmHg (01/08 0600) FiO2 (%):  [40 %] 40 % (01/08 0600) Weight:  [73.6 kg (162 lb 4.1 oz)] 73.6 kg (162 lb 4.1 oz) (01/07 2329) HEMODYNAMICS: CVP:  [4 mmHg-7 mmHg] 6 mmHg VENTILATOR SETTINGS: Vent Mode:  [-] PRVC FiO2 (%):  [40 %] 40 % Set Rate:  [35 bmp] 35 bmp Vt Set:  [420 mL-480 mL] 420 mL PEEP:  [5 cmH20] 5 cmH20 Plateau Pressure:  [20 cmH20-24 cmH20] 20 cmH20 INTAKE / OUTPUT:  Intake/Output Summary (Last 24 hours) at 09/16/14 0739 Last data filed at 09/16/14 0600  Gross per 24 hour  Intake   4950 ml  Output   2290 ml  Net   2660 ml    PHYSICAL EXAMINATION: General:  NAD Neuro:  Sedated rass - 3 HEENT:  [place]/AT, PERRL, ETT in place Cardiovascular:  RRR Lungs:  Mechanical breath sounds bilaterally Abdomen:  Soft, nondistended Musculoskeletal:  No LE edema Skin:  R upper medial thigh s/p I&D with packing in place, surrounding erythema, no fluctuance  LABS:  CBC  Recent Labs Lab 09/15/14 1800 09/15/14 1943  WBC 12.5* 16.0*  HGB 9.7* 9.9*  HCT 30.6*  31.8*  PLT 293 329   Coag's  Recent Labs Lab 09/15/14 1800  INR 1.31   BMET  Recent Labs Lab 09/15/14 2330 09/16/14 0240 09/16/14 0450  NA 142 142 140  K 3.3* 4.1 2.7*  CL 115* 114* 113*  CO2 6* 12* 10*  BUN 20 18 18   CREATININE 1.72* 1.52* 1.62*  GLUCOSE 231* 266* 253*   Electrolytes  Recent Labs Lab 09/15/14 2330 09/16/14 0240 09/16/14 0450  CALCIUM 7.7* 7.7* 7.6*   Sepsis Markers  Recent Labs Lab 09/15/14 1943 09/15/14 2330 09/16/14 0440  LATICACIDVEN 2.7* 6.4* 3.3*   ABG  Recent Labs Lab 09/15/14 1715 09/15/14 2001 09/16/14 0718  PHART 6.932* 6.972* 7.245*  PCO2ART 13.2* 15.9* 20.0*  PO2ART 139.0* 192.0* 202.0*   Liver Enzymes  Recent Labs Lab 09/15/14 1800  AST 16  ALT 22  ALKPHOS 161*  BILITOT 1.7*  ALBUMIN 3.0*   Cardiac Enzymes  Recent Labs Lab 09/15/14 1943 09/16/14 0450  TROPONINI 0.03 <0.03   Glucose  Recent Labs Lab 09/16/14 0108 09/16/14 0213 09/16/14 0317 09/16/14 0404 09/16/14 0518 09/16/14 0622  GLUCAP 125* 159* 256* 286* 232* 189*    Imaging Portable Chest Xray  09/15/2014   CLINICAL DATA:  Assess for endotracheal tube and central line placement.  EXAM: PORTABLE CHEST - 1 VIEW  COMPARISON:  None.  FINDINGS: The heart size is enlarged. Mediastinal contour is normal. Endotracheal  tube is identified with distal tip probably in the carina. Retraction by 4 cm is recommended. Nasogastric tube is identified with distal tip not included on the film but is at least in the stomach. A left jugular central venous line is identified with distal tip is. Neck cava. There is no pneumothorax. There is no focal infiltrate, pulmonary edema, or pleural effusion. The osseous structures are stable.  IMPRESSION: Endotracheal tube with distal tip probably in the carina. Retraction by 4 cm is recommended.  Left jugular central venous line in good position. There is no pneumothorax. Nasogastric tube is identified with distal tip not  included on the film but is at least in the stomach.  These results will be called to the ordering clinician or representative by the Radiologist Assistant, and communication documented in the PACS or zVision Dashboard.   Electronically Signed   By: Abelardo Diesel M.D.   On: 09/15/2014 19:18   Dg Chest Port 1 View  09/15/2014   CLINICAL DATA:  Diabetes. Hyperglycemia. Elevated blood sugar. Blood sugar of 600.  EXAM: PORTABLE CHEST - 1 VIEW  COMPARISON:  07/29/2014.  FINDINGS: Low volume chest. CABG. Monitoring leads project over the chest. Patient is rotated to the RIGHT. Mild basilar atelectasis. No focal consolidation. Cardiopericardial silhouette is within normal limits for low volumes.  IMPRESSION: Low volume chest.   Electronically Signed   By: Dereck Ligas M.D.   On: 09/15/2014 17:29     ASSESSMENT / PLAN:  PULMONARY ETT 1/7 >> A: Acute respiratory failure 2/2 AMS, uncompensated met acidosis - intubated P:   Full vent support,  Daily WUA SBT unable too high MV  Repeat ABG this am noted, still requires rate 35  CARDIOVASCULAR CVL RIJ 1/7 >>  A:  CAD s/p CAGB 2013 CHF - Echo 07/2014 with EF 35-32%, grade 1 diastolic dysfunction Hypotension- hypovolemia, septic shock? Lactic acidosis - improving. New LBBB from most recent EKG changed to wide QRS now - likely 2/2 to hypo K+ P:  Neo gtt titrate MAP goal of 65 trops negative IVF bolused for hypotension, be careful with CHF Hold antihypertensives including coreg, lasix, aldactone EGDT was ordered 1/7 Had a borderline cortisol in past, remains in shock, add stress roids  RENAL A:   CKD stage 3 baseline crt 1.3-1.4 now slightly up likely 2/2 to volume depletion from DKA hyperK + >> hypok+ as expected with overall body depletion Metabolic acidosis with increased AG - improved NONAG acidosis - saline, r/o rta 4 Foley in place  P:   repleting K IVF hydration per DKA protocol BMP to q6h as gap closed dka resolved I would keep  insulin drip going with D5W if gap closes since patient is NPO. When eating, will give lantus ~20 units and stop drip 2 hours later. Avoid any saline Consider addition bicarb for NONAG , ph 7.24  GASTROINTESTINAL A:  GERD P:   SUP: IV Protonix Start TF once off insulin drip  HEMATOLOGIC A:  Leukocytosis  VTE ppx P:  See ID subq hep SCDs Follow CBC  INFECTIOUS A:  Left thigh abscess, leukocytosis P:   Wound care consult  BCx2 1/7 >> UC 1/7 >>   Abx:  Zosyn 1/7 >> Vanc 1/7 >> F/up wound cx.  Narrow abx when Hemodynamically improved and remains afebrile.  will see if Er cultures  ENDOCRINE A:   Severe DKA  DM Hypothyroid P:   Glucose improving. Transition off IVF Continue synthroid lantus ssi  NEUROLOGIC A:  Acute encephalopathy P:   RASS goal: -1 If not improved neurostatus after metabolic correction then consider CT head   FAMILY  - Updates:  - Inter-disciplinary family meet or Palliative Care meeting due by:  1/14  Dellia Nims, M.D PGY-1 Internal Medicine Pager [phone]  STAFF NOTE: I, Merrie Roof, MD FACP have personally reviewed patient's available data, including medical history, events of note, physical examination and test results as part of my evaluation. I have discussed with resident/NP and other care providers such as pharmacist, RN and RRT. In addition, I personally evaluated patient and elicited key findings of: transition off drip as gap closed, treating NONAG, avoid saline, NO SBT, bicarb, abg follow up in am, ABX maintain The patient is critically ill with multiple organ systems failure and requires high complexity decision making for assessment and support, frequent evaluation and titration of therapies, application of advanced monitoring technologies and extensive interpretation of multiple databases.   Critical Care Time devoted to patient care services described in this note is30 Minutes. This time reflects time of care of  this signee: Merrie Roof, MD FACP. This critical care time does not reflect procedure time, or teaching time or supervisory time of PA/NP/Med student/Med Resident etc but could involve care discussion time. Rest per NP/medical resident whose note is outlined above and that I agree with   [name]. Titus Mould, MD, [place] Pgr: [facility] 09/16/2014 10:30 AM

## 2014-09-16 NOTE — Care Management Note (Addendum)
    Page 1 of 1   09/19/2014     3:25:23 PM CARE MANAGEMENT NOTE 09/19/2014  Patient:  Bridget Martinez, Bridget Martinez   Account Number:  [id number]  Date Initiated:  09/16/2014  Documentation initiated by:  Elissa Hefty  Subjective/Objective Assessment:   adm w dka,vent     Action/Plan:   lives w husband, pcp dr Timmothy Sours Laurance Flatten   Anticipated DC Date:  09/20/2014   Anticipated DC Plan:  [place]  CM consult      [place]   Choice offered to / List presented to:  C-1 Patient        [place] arranged  HH-1 RN  [place].   Status of service:  Completed, signed off Medicare Important Message given?  NO (If response is "NO", the following Medicare IM given date fields will be blank) Date Medicare IM given:   Medicare IM given by:   Date Additional Medicare IM given:   Additional Medicare IM given by:    Discharge Disposition:  [place]  Per UR Regulation:  Reviewed for med. necessity/level of care/duration of stay  If discussed at [place] of Stay Meetings, dates discussed:    Comments:  09/19/14 [place], BSN [phone] patient lives with spouse, patient for dc on 1/12, patient chose [facility] for hhpt and hhrn, referral made to [facility] , [place] notified. Soc will begin 24-48 hrs post dc.

## 2014-09-16 NOTE — Progress Notes (Signed)
INITIAL NUTRITION ASSESSMENT  DOCUMENTATION CODES Per approved criteria  -Not Applicable   INTERVENTION:  Initiate TF via OGT with Glucerna 1.2 at 25 ml/h and Prostat 30 ml BID on day 1; on day 2, increase to goal rate of 50 ml/h (1200 ml per day) to provide 1640 kcals, 102 gm protein, 966 ml free water daily.   NUTRITION DIAGNOSIS: Inadequate oral intake related to inability to eat as evidenced by NPO status.   Goal: Intake to meet >90% of estimated nutrition needs.  Monitor:  TF tolerance/adequacy, weight trend, labs, vent status.  Reason for Assessment: MD Consult for TF initiation and management.  64 y.o. female  Admitting Dx: Hyperglycemia  ASSESSMENT: 64 year old woman with history of DM1, HTN, CAD, GERD, hypothyroidism, CHF presenting with DKA and requiring intubation in ED.  Discussed patient in ICU rounds and with RN today. Received MD Consult for TF initiation and management. Transitioning off insulin drip this morning.  Nutrition focused physical exam completed.  No muscle or subcutaneous fat depletion noticed.  Patient is currently intubated on ventilator support MV: 15.3 L/min Temp (24hrs), Avg:96.7 F (35.9 C), Min:93.9 F (34.4 C), Max:98.8 F (37.1 C)  Propofol: none  Height: Ht Readings from Last 1 Encounters:  09/15/14 5\' 4"  (1.626 m)    Weight: Wt Readings from Last 1 Encounters:  09/15/14 162 lb 4.1 oz (73.6 kg)    Ideal Body Weight: 54.5 kg  % Ideal Body Weight: 135%  Wt Readings from Last 10 Encounters:  09/15/14 162 lb 4.1 oz (73.6 kg)  07/28/14 184 lb 1.4 oz (83.5 kg)  06/15/14 174 lb (78.926 kg)  12/02/13 160 lb 12.8 oz (72.938 kg)  11/30/13 159 lb 6.3 oz (72.3 kg)  11/18/13 157 lb 3 oz (71.3 kg)  11/10/13 165 lb (74.844 kg)  10/29/13 174 lb 9.7 oz (79.2 kg)  09/27/13 180 lb (81.647 kg)  09/21/13 178 lb 12.8 oz (81.103 kg)    Usual Body Weight: 184 lb (2 months ago), 160 lb (10 months ago)  % Usual Body Weight:  88-100%  BMI:  Body mass index is 27.84 kg/(m^2).  Estimated Nutritional Needs: Kcal: 1683 Protein: 100-120 gm Fluid: 2 L  Skin: surgical incision  Diet Order: Diet NPO time specified  EDUCATION NEEDS: -Education not appropriate at this time   Intake/Output Summary (Last 24 hours) at 09/16/14 1203 Last data filed at 09/16/14 1000  Gross per 24 hour  Intake 6090.83 ml  Output   2750 ml  Net 3340.83 ml    Last BM: PTA   Labs:   Recent Labs Lab 09/16/14 0240 09/16/14 0450 09/16/14 0912  NA 142 140 142  K 4.1 2.7* 3.9  CL 114* 113* 117*  CO2 12* 10* 14*  BUN 18 18 13   CREATININE 1.52* 1.62* 1.43*  CALCIUM 7.7* 7.6* 7.6*  GLUCOSE 266* 253* 110*    CBG (last 3)   Recent Labs  09/16/14 0518 09/16/14 0622 09/16/14 0817  GLUCAP 232* 189* 112*    Scheduled Meds: . antiseptic oral rinse  7 mL Mouth Rinse QID  . chlorhexidine  15 mL Mouth Rinse BID  . feeding supplement (VITAL HIGH PROTEIN)  1,000 mL Per Tube Q24H  . heparin  5,000 Units Subcutaneous 3 times per day  . hydrocortisone sodium succinate  50 mg Intravenous Q6H  . insulin aspart  0-9 Units Subcutaneous TID WC  . insulin glargine  20 Units Subcutaneous QHS  . levothyroxine  62.5 mcg Intravenous Daily  . pantoprazole (  PROTONIX) IV  40 mg Intravenous Daily  . piperacillin-tazobactam (ZOSYN)  IV  3.375 g Intravenous 3 times per day  . vancomycin  750 mg Intravenous Q12H    Continuous Infusions: . fentaNYL infusion INTRAVENOUS 50 mcg/hr (09/16/14 1052)  . phenylephrine (NEO-SYNEPHRINE) Adult infusion 30 mcg/min (09/16/14 0616)  . phenylephrine (NEO-SYNEPHRINE) Adult infusion 30 mcg/min (09/16/14 1015)  . sodium chloride 0.45 % 1,000 mL with sodium bicarbonate 100 mEq infusion 100 mL/hr at 09/16/14 1146    Past Medical History  Diagnosis Date  . Diabetes mellitus     on insulin, with h/o DKA   . HTN (hypertension)   . Hyperlipidemia   . Venous stasis ulcers   . Arthritis   . Depression      anxiety  . 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 Hypertension   . Coronary artery disease     angina.  MI.   . Pneumonia        . GERD (gastroesophageal reflux disease)     Hiatal hernia.   . Hypothyroidism   . CHF (congestive heart failure)   . Tuberculosis   . Osteoporosis     Past Surgical History  Procedure Laterality Date  . Cardiac catheterization    . Tonsillectomy    . Coronary artery bypass graft      Dr. Prescott Gum in 09/2011   . Coronary artery bypass graft  2012  . Esophagogastroduodenoscopy N/A 11/17/2013    Procedure: ESOPHAGOGASTRODUODENOSCOPY (EGD);  Surgeon: Lafayette Dragon, MD;  Location: [facility] ENDOSCOPY;  Service: Endoscopy;  Laterality: N/A;  . Colonoscopy N/A 11/18/2013    Procedure: COLONOSCOPY;  Surgeon: Lafayette Dragon, MD;  Location: [facility] ENDOSCOPY;  Service: Endoscopy;  Laterality: N/A;  . Right heart catheterization N/A 07/29/2011    Procedure: RIGHT HEART CATH;  Surgeon: Minus Breeding, MD;  Location: [facility] CATH LAB;  Service: Cardiovascular;  Laterality: N/A;  . Left heart catheterization with coronary angiogram N/A 07/26/2011    Procedure: LEFT HEART CATHETERIZATION WITH CORONARY ANGIOGRAM;  Surgeon: Larey Dresser, MD;  Location: [facility] CATH LAB;  Service: Cardiovascular;  Laterality: N/A;     Molli Barrows, RD, LDN, [place] Pager [phone] After Hours Pager [phone]

## 2014-09-16 NOTE — Consult Note (Signed)
WOC wound consult note Reason for Consult: Consult requested for left groin.  Pt had I&D performed in the ER and dressing change orders have not been provided. Wound type: Full thickness Measurement: .5X.5X.8cm Wound bed: 100% beefy red Drainage (amount, consistency, odor) Mod amt red drainage, no odor Periwound: Erythremia and induration surrounding to 3 cm Dressing procedure/placement/frequency: Iodoform packing Q day to assist with drainage and to promote healing, foam dressing to protect.  Please re-consult if further assistance is needed.  Thank-you,  Julien Girt MSN, [place], [place], [place], [place]

## 2014-09-17 DIAGNOSIS — E1310 Other specified diabetes mellitus with ketoacidosis without coma: Secondary | ICD-10-CM

## 2014-09-17 DIAGNOSIS — R6520 Severe sepsis without septic shock: Secondary | ICD-10-CM

## 2014-09-17 DIAGNOSIS — J9600 Acute respiratory failure, unspecified whether with hypoxia or hypercapnia: Secondary | ICD-10-CM

## 2014-09-17 LAB — GLUCOSE, CAPILLARY
GLUCOSE-CAPILLARY: 100 mg/dL — AB (ref 70–99)
GLUCOSE-CAPILLARY: 147 mg/dL — AB (ref 70–99)
GLUCOSE-CAPILLARY: 149 mg/dL — AB (ref 70–99)
GLUCOSE-CAPILLARY: 194 mg/dL — AB (ref 70–99)
GLUCOSE-CAPILLARY: 95 mg/dL (ref 70–99)
Glucose-Capillary: 127 mg/dL — ABNORMAL HIGH (ref 70–99)
Glucose-Capillary: 127 mg/dL — ABNORMAL HIGH (ref 70–99)
Glucose-Capillary: 159 mg/dL — ABNORMAL HIGH (ref 70–99)
Glucose-Capillary: 86 mg/dL (ref 70–99)
Glucose-Capillary: 86 mg/dL (ref 70–99)
Glucose-Capillary: 105 mg/dL — ABNORMAL HIGH (ref 70–99)
Glucose-Capillary: 330 mg/dL — ABNORMAL HIGH (ref 70–99)
Glucose-Capillary: 165 mg/dL — ABNORMAL HIGH (ref 70–99)
Glucose-Capillary: 308 mg/dL — ABNORMAL HIGH (ref 70–99)
Glucose-Capillary: 216 mg/dL — ABNORMAL HIGH (ref 70–99)
Glucose-Capillary: 186 mg/dL — ABNORMAL HIGH (ref 70–99)

## 2014-09-17 LAB — BASIC METABOLIC PANEL
ANION GAP: 15 (ref 5–15)
ANION GAP: 15 (ref 5–15)
BUN: 12 mg/dL (ref 6–23)
BUN: 12 mg/dL (ref 6–23)
CALCIUM: 7.7 mg/dL — AB (ref 8.4–10.5)
CHLORIDE: 109 meq/L (ref 96–112)
CHLORIDE: 108 meq/L (ref 96–112)
CO2: 17 mmol/L — ABNORMAL LOW (ref 19–32)
CO2: 17 mmol/L — AB (ref 19–32)
CREATININE: 1.24 mg/dL — AB (ref 0.50–1.10)
Calcium: 7.3 mg/dL — ABNORMAL LOW (ref 8.4–10.5)
Creatinine, Ser: 1.25 mg/dL — ABNORMAL HIGH (ref 0.50–1.10)
GFR calc non Af Amer: 45 mL/min — ABNORMAL LOW (ref >90)
GFR, EST AFRICAN AMERICAN: 52 mL/min — AB (ref >90)
GFR, EST AFRICAN AMERICAN: 52 mL/min — AB (ref >90)
GFR, EST NON AFRICAN AMERICAN: 44 mL/min — AB (ref >90)
GLUCOSE: 350 mg/dL — AB (ref 70–99)
Glucose, Bld: 350 mg/dL — ABNORMAL HIGH (ref 70–99)
POTASSIUM: 3.7 mmol/L (ref 3.5–5.1)
Potassium: 4.2 mmol/L (ref 3.5–5.1)
SODIUM: 141 mmol/L (ref 135–145)
Sodium: 140 mmol/L (ref 135–145)

## 2014-09-17 LAB — BLOOD GAS, ARTERIAL
Acid-base deficit: 12.8 mmol/L — ABNORMAL HIGH (ref 0.0–2.0)
BICARBONATE: 12.2 meq/L — AB (ref 20.0–24.0)
Drawn by: 36989
FIO2: 0.6 %
MECHVT: 420 mL
O2 Saturation: 98.9 %
PCO2 ART: 25.3 mmHg — AB (ref 35.0–45.0)
PEEP: 5 cmH2O
PO2 ART: 215 mmHg — AB (ref 80.0–100.0)
Patient temperature: 98.6
RATE: 35 resp/min
TCO2: 13 mmol/L (ref 0–100)
pH, Arterial: 7.307 — ABNORMAL LOW (ref 7.350–7.450)

## 2014-09-17 MED ORDER — INSULIN ASPART 100 UNIT/ML ~~LOC~~ SOLN
0.0000 [IU] | SUBCUTANEOUS | Status: DC
  Administered 2014-09-17: 2 [IU] via SUBCUTANEOUS
  Administered 2014-09-17: 3 [IU] via SUBCUTANEOUS
  Administered 2014-09-17: 11 [IU] via SUBCUTANEOUS

## 2014-09-17 MED ORDER — FENTANYL CITRATE 0.05 MG/ML IJ SOLN: 12.5000 ug | INTRAMUSCULAR | Status: DC | PRN

## 2014-09-17 MED ORDER — ENOXAPARIN SODIUM 40 MG/0.4ML ~~LOC~~ SOLN
40.0000 mg | SUBCUTANEOUS | Status: DC
  Administered 2014-09-17 – 2014-09-20 (×4): 40 mg via SUBCUTANEOUS
  Filled 2014-09-17 (×6): qty 0.4

## 2014-09-17 MED ORDER — CARVEDILOL 6.25 MG PO TABS: 6.2500 mg | ORAL_TABLET | Freq: Two times a day (BID) | ORAL | Status: DC

## 2014-09-17 MED ORDER — INSULIN ASPART 100 UNIT/ML ~~LOC~~ SOLN
0.0000 [IU] | SUBCUTANEOUS | Status: DC
  Administered 2014-09-17: 2 [IU] via SUBCUTANEOUS
  Administered 2014-09-17: 7 [IU] via SUBCUTANEOUS

## 2014-09-17 MED ORDER — INSULIN DETEMIR 100 UNIT/ML ~~LOC~~ SOLN
7.0000 [IU] | Freq: Every day | SUBCUTANEOUS | Status: DC
  Administered 2014-09-17: 7 [IU] via SUBCUTANEOUS
  Filled 2014-09-17 (×2): qty 0.07

## 2014-09-17 MED ORDER — CARVEDILOL 6.25 MG PO TABS
6.2500 mg | ORAL_TABLET | Freq: Two times a day (BID) | ORAL | Status: DC
  Administered 2014-09-18 – 2014-09-21 (×7): 6.25 mg via ORAL
  Filled 2014-09-17 (×11): qty 1

## 2014-09-17 MED ORDER — PANTOPRAZOLE SODIUM 40 MG PO TBEC
40.0000 mg | DELAYED_RELEASE_TABLET | Freq: Every day | ORAL | Status: DC
  Administered 2014-09-18 – 2014-09-21 (×4): 40 mg via ORAL
  Filled 2014-09-17 (×3): qty 1

## 2014-09-17 MED ORDER — ASPIRIN EC 81 MG PO TBEC
81.0000 mg | DELAYED_RELEASE_TABLET | Freq: Every day | ORAL | Status: DC
  Administered 2014-09-18 – 2014-09-21 (×4): 81 mg via ORAL
  Filled 2014-09-17 (×5): qty 1

## 2014-09-17 MED ORDER — CLOPIDOGREL BISULFATE 75 MG PO TABS
75.0000 mg | ORAL_TABLET | Freq: Every day | ORAL | Status: DC
  Administered 2014-09-18 – 2014-09-21 (×4): 75 mg via ORAL
  Filled 2014-09-17 (×6): qty 1

## 2014-09-17 NOTE — Progress Notes (Signed)
PULMONARY / CRITICAL CARE MEDICINE   Name: LAEKYN RAYOS MRN: [id number] DOB: [birth year]    ADMISSION DATE:  09/15/2014 CONSULTATION DATE:  09/15/2014  REFERRING MD :  ED (primary MD - Dr Buddy Duty)  CHIEF COMPLAINT:  Hyperglycemia  INITIAL PRESENTATION:  64 year old woman with history of DM1, HTN, CAD, GERD, hypothyroidism, CHF presenting with DKA and requiring intubation in ED. Was started on insulin drip, IVF and bicarb.  SIGNIFICANT EVENTS/STUDIES:  1/7: Intubated, L medial upper thigh abscess s/p bedside I&D. 01/09 Passed SBT. Extubated  REVIEW OF SYSTEMS:    SUBJECTIVE: RASS 0. + F/C. Passed SBT  VITAL SIGNS: Temp:  [98.5 F (36.9 C)-99.8 F (37.7 C)] 98.8 F (37.1 C) (01/09 1000) Pulse Rate:  [63-92] 68 (01/09 1000) Resp:  [11-35] 35 (01/09 1000) BP: (90-140)/(33-62) 122/49 mmHg (01/09 1000) SpO2:  [100 %] 100 % (01/09 1000) Arterial Line BP: (93-147)/(30-70) 128/70 mmHg (01/09 1000) FiO2 (%):  [40 %] 40 % (01/09 1000) Weight:  [78.7 kg (173 lb 8 oz)] 78.7 kg (173 lb 8 oz) (01/09 0500) HEMODYNAMICS:   VENTILATOR SETTINGS: Vent Mode:  [-] PRVC FiO2 (%):  [40 %] 40 % Set Rate:  [35 bmp] 35 bmp Vt Set:  [420 mL] 420 mL PEEP:  [5 cmH20] 5 cmH20 Plateau Pressure:  [12 cmH20-27 cmH20] 18 cmH20 INTAKE / OUTPUT:  Intake/Output Summary (Last 24 hours) at 09/17/14 1211 Last data filed at 09/17/14 1040  Gross per 24 hour  Intake 4111.81 ml  Output    895 ml  Net 3216.81 ml    PHYSICAL EXAMINATION: General:  NAD Neuro:  No focal deficits HEENT: WNL Cardiovascular:  Reg, no M Lungs: clear Abdomen:  Soft, nondistended, +BS Ext:  No edema Skin:  R upper medial thigh lesion dressed  LABS: I have reviewed all of today's lab results. Relevant abnormalities are discussed in the A/P section  CXR: NNF  ASSESSMENT / PLAN:  PULMONARY A: Acute respiratory failure due to AMS, met acidosis - resolved P:   Extubate Sup O2 as needed Monitor in ICU with usual post  extubation care   CARDIOVASCULAR CVL RIJ 1/7 >>  A:  CAD s/p CAGB 2013 CHF - Echo 07/2014 with EF 89-21%, grade 1 diastolic dysfunction Hypotension, resolved Lactic acidosis, resolved New LBBB, resolved P:  DC vasopressors from MAR MAP goal 60 mmHg Resume low dose carvedilol Resume ASA, clopidogrel  RENAL A:   AKI CKD - baseline crt 1.9-4.1  Metabolic acidosis, mild - improving  P:   Monitor BMET intermittently Monitor I/Os Correct electrolytes as indicated DC HCO3 gtt Maintenance IVFs until taking POs  GASTROINTESTINAL A:   GERD, chronic PPI use P:   SUP: IV Protonix NPO post extubation  HEMATOLOGIC A:   Chronic anemia without acute blood los P:  DVT px: LMWH Monitor CBC intermittently Transfuse per usual ICU guidelines  INFECTIOUS A:   Severe sepsis, resolved Left thigh abscess  P:   BCx2 1/7 >> UC 1/7 >> Abx:  Zosyn 1/7 >> Vanc 1/7 >> 01/09   ENDOCRINE A:   DKA - resolved DM  Hypothyroid P:   Change to levemir at reduced dose until taking POs Change SSI to mod scale Cont IV levothyroxine  NEUROLOGIC A:   Acute encephalopathy, resolved Depression - son expressed significant concern re: this P:   RASS goal: 0 Consider Zoloft 01/10 Consider Psych eval   FAMILY  - Updates: 01/09  CCM X 40 mins  Merton Border, MD ; [facility] service [place] (  [phone].  After 5:30 PM or weekends, call [phone]

## 2014-09-17 NOTE — Progress Notes (Signed)
Fentanyl gtt d/c'd, 50 ml wasted in medication room sink, bag and tubing disposed appropriately w/ Daron Offer, RN witnessing. RN also returned an unopened 250 ml bag of Fentanyl to the main pharmacy.

## 2014-09-17 NOTE — Procedures (Signed)
Extubation Procedure Note  Patient Details:   Name: Bridget Martinez DOB: [birth year] MRN: [id number]   Airway Documentation:     Evaluation  O2 sats: stable throughout Complications: No apparent complications Patient did tolerate procedure well. Bilateral Breath Sounds: Clear Suctioning: Airway Yes   Patient extubated to 2L nasal cannula.  Positive cuff leak noted.  No evidence of stridor.  Sats currently 100%.  Vitals are stable.  Incentive spirometry performed with achieved goal of 1000.  No apparent complications.  Philomena Doheny 09/17/2014, 12:50 PM

## 2014-09-18 ENCOUNTER — Inpatient Hospital Stay (HOSPITAL_COMMUNITY): Payer: BC Managed Care – PPO

## 2014-09-18 LAB — COMPREHENSIVE METABOLIC PANEL
ALK PHOS: 118 U/L — AB (ref 39–117)
ALT: 14 U/L (ref 0–35)
ANION GAP: 8 (ref 5–15)
AST: 22 U/L (ref 0–37)
Albumin: 2 g/dL — ABNORMAL LOW (ref 3.5–5.2)
BUN: 11 mg/dL (ref 6–23)
CO2: 28 mmol/L (ref 19–32)
CREATININE: 0.97 mg/dL (ref 0.50–1.10)
Calcium: 8 mg/dL — ABNORMAL LOW (ref 8.4–10.5)
Chloride: 110 mEq/L (ref 96–112)
GFR calc Af Amer: 70 mL/min — ABNORMAL LOW (ref >90)
GFR, EST NON AFRICAN AMERICAN: 60 mL/min — AB (ref >90)
Glucose, Bld: 66 mg/dL — ABNORMAL LOW (ref 70–99)
Potassium: 2.9 mmol/L — ABNORMAL LOW (ref 3.5–5.1)
Sodium: 146 mmol/L — ABNORMAL HIGH (ref 135–145)
Total Bilirubin: 0.4 mg/dL (ref 0.3–1.2)
Total Protein: 4.3 g/dL — ABNORMAL LOW (ref 6.0–8.3)

## 2014-09-18 LAB — GLUCOSE, CAPILLARY
GLUCOSE-CAPILLARY: 54 mg/dL — AB (ref 70–99)
GLUCOSE-CAPILLARY: 39 mg/dL — AB (ref 70–99)
GLUCOSE-CAPILLARY: 167 mg/dL — AB (ref 70–99)
GLUCOSE-CAPILLARY: 201 mg/dL — AB (ref 70–99)
Glucose-Capillary: 100 mg/dL — ABNORMAL HIGH (ref 70–99)
Glucose-Capillary: 113 mg/dL — ABNORMAL HIGH (ref 70–99)
Glucose-Capillary: 277 mg/dL — ABNORMAL HIGH (ref 70–99)
Glucose-Capillary: 47 mg/dL — ABNORMAL LOW (ref 70–99)
Glucose-Capillary: 57 mg/dL — ABNORMAL LOW (ref 70–99)
Glucose-Capillary: 94 mg/dL (ref 70–99)
Glucose-Capillary: 94 mg/dL (ref 70–99)

## 2014-09-18 LAB — BASIC METABOLIC PANEL
ANION GAP: 15 (ref 5–15)
BUN: 10 mg/dL (ref 6–23)
CO2: 19 mmol/L (ref 19–32)
Calcium: 8 mg/dL — ABNORMAL LOW (ref 8.4–10.5)
Chloride: 107 mEq/L (ref 96–112)
Creatinine, Ser: 1 mg/dL (ref 0.50–1.10)
GFR, EST AFRICAN AMERICAN: 67 mL/min — AB (ref >90)
GFR, EST NON AFRICAN AMERICAN: 58 mL/min — AB (ref >90)
GLUCOSE: 192 mg/dL — AB (ref 70–99)
POTASSIUM: 3.7 mmol/L (ref 3.5–5.1)
SODIUM: 141 mmol/L (ref 135–145)

## 2014-09-18 LAB — CBC
HCT: 24.9 % — ABNORMAL LOW (ref 36.0–46.0)
Hemoglobin: 8.4 g/dL — ABNORMAL LOW (ref 12.0–15.0)
MCH: 28.8 pg (ref 26.0–34.0)
MCHC: 33.7 g/dL (ref 30.0–36.0)
MCV: 85.3 fL (ref 78.0–100.0)
PLATELETS: 210 10*3/uL (ref 150–400)
RBC: 2.92 MIL/uL — ABNORMAL LOW (ref 3.87–5.11)
RDW: 14.2 % (ref 11.5–15.5)
WBC: 9 10*3/uL (ref 4.0–10.5)

## 2014-09-18 MED ORDER — LEVOTHYROXINE SODIUM 125 MCG PO TABS
125.0000 ug | ORAL_TABLET | Freq: Every day | ORAL | Status: DC
  Administered 2014-09-18 – 2014-09-21 (×4): 125 ug via ORAL
  Filled 2014-09-18 (×6): qty 1

## 2014-09-18 MED ORDER — INSULIN ASPART 100 UNIT/ML ~~LOC~~ SOLN
3.0000 [IU] | Freq: Three times a day (TID) | SUBCUTANEOUS | Status: DC
  Administered 2014-09-19 – 2014-09-21 (×7): 3 [IU] via SUBCUTANEOUS

## 2014-09-18 MED ORDER — ALENDRONATE SODIUM 70 MG PO TABS: 70.0000 mg | ORAL_TABLET | ORAL | Status: DC

## 2014-09-18 MED ORDER — INSULIN ASPART 100 UNIT/ML ~~LOC~~ SOLN
0.0000 [IU] | Freq: Every day | SUBCUTANEOUS | Status: DC
  Administered 2014-09-18: 3 [IU] via SUBCUTANEOUS
  Administered 2014-09-19: 4 [IU] via SUBCUTANEOUS

## 2014-09-18 MED ORDER — ESCITALOPRAM OXALATE 10 MG PO TABS
10.0000 mg | ORAL_TABLET | Freq: Every day | ORAL | Status: DC
  Administered 2014-09-18 – 2014-09-21 (×4): 10 mg via ORAL
  Filled 2014-09-18 (×4): qty 1

## 2014-09-18 MED ORDER — SERTRALINE HCL 50 MG PO TABS: 50.0000 mg | ORAL_TABLET | Freq: Every day | ORAL | Status: DC

## 2014-09-18 MED ORDER — INSULIN ASPART 100 UNIT/ML ~~LOC~~ SOLN
0.0000 [IU] | Freq: Three times a day (TID) | SUBCUTANEOUS | Status: DC
  Administered 2014-09-18 – 2014-09-19 (×2): 3 [IU] via SUBCUTANEOUS
  Administered 2014-09-19: 8 [IU] via SUBCUTANEOUS
  Administered 2014-09-20: 3 [IU] via SUBCUTANEOUS
  Administered 2014-09-20: 2 [IU] via SUBCUTANEOUS
  Administered 2014-09-20: 3 [IU] via SUBCUTANEOUS
  Administered 2014-09-21: 2 [IU] via SUBCUTANEOUS

## 2014-09-18 MED ORDER — POTASSIUM CHLORIDE 10 MEQ/50ML IV SOLN
10.0000 meq | INTRAVENOUS | Status: AC
  Administered 2014-09-18 (×6): 10 meq via INTRAVENOUS
  Filled 2014-09-18 (×6): qty 50

## 2014-09-18 MED ORDER — DEXTROSE 50 % IV SOLN
INTRAVENOUS | Status: AC
  Administered 2014-09-18: 50 mL
  Filled 2014-09-18: qty 50

## 2014-09-18 MED ORDER — ROSUVASTATIN CALCIUM 40 MG PO TABS
40.0000 mg | ORAL_TABLET | Freq: Every day | ORAL | Status: DC
  Administered 2014-09-18 – 2014-09-20 (×3): 40 mg via ORAL
  Filled 2014-09-18 (×5): qty 1

## 2014-09-18 NOTE — Progress Notes (Signed)
Decreased urinary output 11 pm to 4 am output totaling 40 cc. Bladder scan perform with a 108 cc resulted. Foley flushed with 10 cc. Will re-evaluate in 1 hour. Serum potassium 2.9. Elink contacted for replacement.

## 2014-09-18 NOTE — Progress Notes (Signed)
NURSING PROGRESS NOTE  Bridget Martinez [id number] Transfer Data: 09/18/2014 3:49 PM Attending Provider: [name], MD [name], DONALD, MD Code Status: full   Bridget Martinez is a 64 y.o. female patient transferred from 52M  -No acute distress noted.  -No complaints of shortness of breath.  -No complaints of chest pain.     Last Documented Vital Signs: Blood pressure 102/78, pulse 73, temperature 98.2 F (36.8 C), temperature source Oral, resp. rate 16, height 5\' 4"  (1.626 m), weight 78.7 kg (173 lb 8 oz), SpO2 100 %.  IV Fluids:  IV in place, occlusive dsg intact without redness, IV cath hand right, condition patent and no redness normal saline lock.   Allergies:  Sulfa antibiotics  Past Medical History:   has a past medical history of Diabetes mellitus; HTN (hypertension); Hyperlipidemia; Venous stasis ulcers; Arthritis; Depression; Myocardial infarction; Hypertension; Coronary artery disease; Pneumonia; GERD (gastroesophageal reflux disease); Hypothyroidism; CHF (congestive heart failure); Tuberculosis; and Osteoporosis.  Past Surgical History:   has past surgical history that includes Cardiac catheterization; Tonsillectomy; Coronary artery bypass graft; Coronary artery bypass graft (2012); Esophagogastroduodenoscopy (N/A, 11/17/2013); Colonoscopy (N/A, 11/18/2013); right heart catheterization (N/A, 07/29/2011); and left heart catheterization with coronary angiogram (N/A, 07/26/2011).  Social History:   reports that she has never smoked. She does not have any smokeless tobacco history on file. She reports that she does not drink alcohol or use illicit drugs.  Skin: pt has drsg to left neck, right wrist, and left groin/thigh.   Patient/Family orientated to room. Information packet given to patient/family. Admission inpatient armband information verified with patient/family to include name and date of birth and placed on patient arm. Side rails up x 2, fall assessment and  education completed with patient/family. Patient/family able to verbalize understanding of risk associated with falls and verbalized understanding to call for assistance before getting out of bed. Call light within reach. Patient/family able to voice and demonstrate understanding of unit orientation instructions.    Will continue to evaluate and treat per MD orders.

## 2014-09-18 NOTE — Progress Notes (Signed)
Hypoglycemic Event  CBG: 57  Treatment: 15 GM carbohydrate snack  Symptoms: None  Follow-up CBG: Time:0911 CBG Result:94  Possible Reasons for Event: Inadequate meal intake  Comments/MD notified: Dr. Genene Churn.  Patient given additional snack, no meal for >1 hr    Beabrout,Saabir Blyth L  Remember to initiate Hypoglycemia Order Set & complete

## 2014-09-18 NOTE — Progress Notes (Signed)
Hypoglycemic Event  CBG: 54  Treatment: D50 IV 50 mL  Symptoms: Hungry and None  Follow-up CBG: Time:0418 CBG Result:113  Possible Reasons for Event: No Meal intake  Comments/MD notified: PCCM-Fellows    Beza Steppe O  Remember to initiate Hypoglycemia Order Set & complete

## 2014-09-18 NOTE — Progress Notes (Signed)
Hypoglycemic Event  CBG: 47  Treatment: 15 GM carbohydrate snack  Symptoms: None  Follow-up CBG: Time:8:42 CBG Result:57  Possible Reasons for Event: Inadequate meal intake  Comments/MD notified: Resident physician notified, Dr. Maryln Gottron L  Remember to initiate Hypoglycemia Order Set & complete

## 2014-09-18 NOTE — Progress Notes (Signed)
Patient CBG of 47, Resident physician notified.  Patient given 4 oz. Regular Sprite.  Will reassess in 15 min.

## 2014-09-18 NOTE — Progress Notes (Addendum)
PULMONARY / CRITICAL CARE MEDICINE   Name: Bridget Martinez MRN: [id number] DOB: 1951/03/15    ADMISSION DATE:  09/15/2014 CONSULTATION DATE:  09/15/2014  REFERRING MD :  ED (primary MD - Dr Buddy Duty)  CHIEF COMPLAINT:  Hyperglycemia  INITIAL PRESENTATION:  64 year old woman with history of DM1, HTN, CAD, GERD, hypothyroidism, CHF presenting with DKA and requiring intubation in ED. Was started on insulin drip, IVF and bicarb.  SIGNIFICANT EVENTS/STUDIES:  Studies: 1/7 cxr:  1/10 cxr: no pnx, no edema or consolidation  Events: 1/7: Intubated, L medial upper thigh abscess s/p bedside I&D. 01/09 Passed SBT. Extubated  REVIEW OF SYSTEMS:  Denies any complaints such as cp/sob/n/v.   SUBJECTIVE: Doing well. Had some hypoglycemia episodes overnight likely due to being NPO.   VITAL SIGNS: Temp:  [97.8 F (36.6 C)-99.6 F (37.6 C)] 98.2 F (36.8 C) (01/10 0600) Pulse Rate:  [62-89] 62 (01/10 0600) Resp:  [12-35] 16 (01/10 0600) BP: (98-137)/(35-84) 106/45 mmHg (01/10 0600) SpO2:  [100 %] 100 % (01/10 0600) Arterial Line BP: (60-128)/(40-70) 78/53 mmHg (01/09 1700) FiO2 (%):  [40 %] 40 % (01/09 1200) HEMODYNAMICS:   VENTILATOR SETTINGS: Vent Mode:  [-] PRVC FiO2 (%):  [40 %] 40 % Set Rate:  [35 bmp] 35 bmp Vt Set:  [420 mL] 420 mL PEEP:  [5 cmH20] 5 cmH20 Plateau Pressure:  [18 cmH20] 18 cmH20 INTAKE / OUTPUT:  Intake/Output Summary (Last 24 hours) at 09/18/14 0635 Last data filed at 09/18/14 0612  Gross per 24 hour  Intake   1410 ml  Output    860 ml  Net    550 ml    PHYSICAL EXAMINATION: General:  NAD, awake, cooperating.  Neuro:  No focal deficits. A&ox4.  HEENT: WNL Cardiovascular:  Reg, no M Lungs: clear Abdomen:  Soft, nondistended, +BS Ext:  No edema Skin:  R upper medial thigh lesion dressed, no significant erythema or purulence.   LABS: I have reviewed all of today's lab results. Relevant abnormalities are discussed in the A/P section  CXR:  NNF  ASSESSMENT / PLAN:  PULMONARY A: Acute respiratory failure due to AMS, dka/met acidosis - resolved P:   Extubated Sup O2 as needed Monitor in ICU with usual post extubation care   CARDIOVASCULAR CVL RIJ 1/7 >>  A:  CAD s/p CAGB 2013 CHF - Echo 07/2014 with EF 10-93%, grade 1 diastolic dysfunction Hypotension, resolved Lactic acidosis, resolved New LBBB, resolved P:  Off pressors. BP now fine.  Resume low dose carvedilol Resume ASA, clopidogrel Hold other antihypertensives for now (spironolactone, enapril). Slowly add back as BP tolerates.  RENAL A:   AKI - resolved CKD - at baseline crt 2.3-5.5  Metabolic acidosis, mild - resolved.  Hypokalemia (got 6 runs of 45mq). Hypernatremia - likely 2/2 to no IVF and NPO Decreased UOP P:   Monitor BMET q12hrs.  Monitor I/Os Correct electrolytes as indicated Monitor sodium, expecting improvement as started PO diet today. If not improving, may need to give d5W 50cc/hr.   GASTROINTESTINAL A:   GERD, chronic PPI use P:   SUP: IV Protonix Would start feeding her since she is awake now. Started clear liquid diet.  HEMATOLOGIC A:   Chronic anemia without acute blood los P:  DVT px: LMWH Monitor CBC intermittently Transfuse per usual ICU guidelines  INFECTIOUS A:   Severe sepsis, resolved Left thigh abscess - afebrile currently, leukocytosis resolved.  P:   BCx2 1/7 >> UC 1/7 >> Abx:  Zosyn 1/7 >>  stop? Vanc 1/7 >> 01/09  Consider stopping zosyn since patient stable, bcx negative.   ENDOCRINE A:   DKA - resolved. Likely 2/2 to infection and non compliance with insulin. DM  Hypothyroid P:   Changed to levemir at reduced dose (7) until taking POs  On SSI moderate Cont IV levothyroxine  NEUROLOGIC A:   Acute encephalopathy, resolved Depression - son expressed significant concern re: this P:   RASS goal: 0 Consider Zoloft? Consider Psych eval - this can wait for outpatient I think.  FAMILY  -  Updates: 01/09   Dellia Nims, M.D PGY-1 Internal Medicine Pager [phone]  PCCM ATTENDING: I have reviewed pt's initial presentation, consultants notes and hospital database in detail.  The above assessment and plan was formulated under my direction. Transfer to med-surg. TRH to assume care as of AM 1/11 and PCCM will sign off. Discussed with Dr Emmit Alexanders, MD;  PCCM service; [place] [phone]

## 2014-09-18 NOTE — Progress Notes (Signed)
[facility] Progress Note Patient Name: Bridget Martinez DOB: Jun 02, 1951 MRN: [id number]   Date of Service  09/18/2014  HPI/Events of Note  Hypokalemia  eICU Interventions  Potassium replaced     Intervention Category Intermediate Interventions: Electrolyte abnormality - evaluation and management  Kamdyn Covel 09/18/2014, 5:00 AM

## 2014-09-19 DIAGNOSIS — E1041 Type 1 diabetes mellitus with diabetic mononeuropathy: Secondary | ICD-10-CM

## 2014-09-19 DIAGNOSIS — E039 Hypothyroidism, unspecified: Secondary | ICD-10-CM | POA: Diagnosis present

## 2014-09-19 DIAGNOSIS — I5042 Chronic combined systolic (congestive) and diastolic (congestive) heart failure: Secondary | ICD-10-CM | POA: Diagnosis present

## 2014-09-19 LAB — BASIC METABOLIC PANEL
ANION GAP: 5 (ref 5–15)
BUN: 11 mg/dL (ref 6–23)
CO2: 25 mmol/L (ref 19–32)
Calcium: 7.8 mg/dL — ABNORMAL LOW (ref 8.4–10.5)
Chloride: 109 mEq/L (ref 96–112)
Creatinine, Ser: 0.88 mg/dL (ref 0.50–1.10)
GFR calc non Af Amer: 68 mL/min — ABNORMAL LOW (ref >90)
GFR, EST AFRICAN AMERICAN: 79 mL/min — AB (ref >90)
Glucose, Bld: 177 mg/dL — ABNORMAL HIGH (ref 70–99)
Potassium: 4.1 mmol/L (ref 3.5–5.1)
Sodium: 139 mmol/L (ref 135–145)

## 2014-09-19 LAB — GLUCOSE, CAPILLARY
GLUCOSE-CAPILLARY: 280 mg/dL — AB (ref 70–99)
GLUCOSE-CAPILLARY: 199 mg/dL — AB (ref 70–99)
Glucose-Capillary: 105 mg/dL — ABNORMAL HIGH (ref 70–99)
Glucose-Capillary: 330 mg/dL — ABNORMAL HIGH (ref 70–99)

## 2014-09-19 LAB — HEMOGLOBIN A1C
HEMOGLOBIN A1C: 11.3 % — AB (ref <5.7)
Mean Plasma Glucose: 278 mg/dL — ABNORMAL HIGH (ref <117)

## 2014-09-19 LAB — BRAIN NATRIURETIC PEPTIDE: B Natriuretic Peptide: 298.2 pg/mL — ABNORMAL HIGH (ref 0.0–100.0)

## 2014-09-19 MED ORDER — INSULIN DETEMIR 100 UNIT/ML ~~LOC~~ SOLN
5.0000 [IU] | Freq: Every day | SUBCUTANEOUS | Status: DC
  Administered 2014-09-19 – 2014-09-20 (×2): 5 [IU] via SUBCUTANEOUS
  Filled 2014-09-19 (×3): qty 0.05

## 2014-09-19 MED ORDER — METOCLOPRAMIDE HCL 5 MG PO TABS
5.0000 mg | ORAL_TABLET | Freq: Three times a day (TID) | ORAL | Status: DC
  Administered 2014-09-20 – 2014-09-21 (×5): 5 mg via ORAL
  Filled 2014-09-19 (×7): qty 1

## 2014-09-19 MED ORDER — LIVING BETTER WITH HEART FAILURE BOOK
Freq: Once | Status: AC
  Administered 2014-09-19: 14:00:00

## 2014-09-19 MED ORDER — FUROSEMIDE 40 MG PO TABS
40.0000 mg | ORAL_TABLET | Freq: Every day | ORAL | Status: DC
  Administered 2014-09-19 – 2014-09-20 (×2): 40 mg via ORAL
  Filled 2014-09-19 (×2): qty 1

## 2014-09-19 NOTE — Progress Notes (Signed)
Inpatient Diabetes Program Recommendations  AACE/ADA: New Consensus Statement on Inpatient Glycemic Control (2013)  Target Ranges:  Prepandial:   less than 140 mg/dL      Peak postprandial:   less than 180 mg/dL (1-2 hours)      Critically ill patients:  140 - 180 mg/dL     Results for MAKAYDEN, REDBIRD (MRN [id number]) as of 09/19/2014 10:08  Ref. Range 09/18/2014 00:02 09/18/2014 03:59 09/18/2014 04:20 09/18/2014 08:11 09/18/2014 08:14 09/18/2014 08:42 09/18/2014 09:11 09/18/2014 12:35 09/18/2014 16:34 09/18/2014 22:22 09/18/2014 23:33  Glucose-Capillary Latest Range: 70-99 mg/dL 94 54 (L) 113 (H) 39 (LL) 47 (L) 57 (L) 94 100 (H) 167 (H) 201 (H) 277 (H)    Results for JAYLINA, PERSHING (MRN [id number]) as of 09/19/2014 10:08  Ref. Range 09/19/2014 07:50  Glucose-Capillary Latest Range: 70-99 mg/dL 280 (H)     Current Orders: Novolog Moderate SSI tid ac + HS     Novolog 3 units tidwc   **Note patient with severe hypoglycemia yesterday AM after receiving 7 units Levemir the night prior  **Note Levemir was discontinued and patient did not receive any Levemir yesterday (01/10)  **Glucose this AM was 280 mg/dl by fingerstick    MD- Please consider adding back a lower dose of Levemir insulin- Levemir 5 units daily     Will follow [name] RN, MSN, CDE Diabetes Coordinator Inpatient Diabetes Program Team Pager: [phone] (8a-10p)

## 2014-09-19 NOTE — Progress Notes (Signed)
NUTRITION FOLLOW UP  Intervention:   -Continue with current plan of care  Nutrition Dx:   Inadequate oral intake related to inability to eat as evidenced by NPO status; resolved  New nutrition dx: None at this time  Goal:   Pt will meet >90% of estimated nutritional needs  Monitor:   PO intake, labs, weight changes, I/O's  Assessment:   64 year old woman with history of DM1, HTN, CAD, GERD, hypothyroidism, CHF presenting with DKA and requiring intubation in ED.  Pt was extubated on 09/17/14. Nutritional needs re-estimated due to change in status. Pt unavailable at time of visit. She has been advanced to a Heart Healthy/Carb Modified diet. She is tolerating well; PO: 50-100%.  Per RNCM, likely d/c tomorrow (09/20/14).  Labs reviewed. Calcium: 7.8, Glucose: 177, CBGS: 199-280.   Height: Ht Readings from Last 1 Encounters:  09/15/14 5\' 4"  (1.626 m)    Weight Status:   Wt Readings from Last 1 Encounters:  09/17/14 173 lb 8 oz (78.7 kg)   09/15/14 162 lb 4.1 oz (73.6 kg)   Re-estimated needs:  Kcal: 1500-1700 Protein: 67-77 grams Fluid: 1.5-1.7 L  Skin: open incision on left upper thigh  Diet Order: Diet heart healthy/carb modified   Intake/Output Summary (Last 24 hours) at 09/19/14 1438 Last data filed at 09/19/14 1358  Gross per 24 hour  Intake    780 ml  Output    400 ml  Net    380 ml    Last BM: PTA   Labs:   Recent Labs Lab 09/18/14 0330 09/18/14 1840 09/19/14 0441  NA 146* 141 139  K 2.9* 3.7 4.1  CL 110 107 109  CO2 28 19 25   BUN 11 10 11   CREATININE 0.97 1.00 0.88  CALCIUM 8.0* 8.0* 7.8*  GLUCOSE 66* 192* 177*    CBG (last 3)   Recent Labs  09/18/14 2333 09/19/14 0750 09/19/14 1201  GLUCAP 277* 280* 199*    Scheduled Meds: . aspirin EC  81 mg Oral Daily  . carvedilol  6.25 mg Oral BID WC  . clopidogrel  75 mg Oral Q breakfast  . enoxaparin (LOVENOX) injection  40 mg Subcutaneous Q24H  . escitalopram  10 mg Oral Daily  .  insulin aspart  0-15 Units Subcutaneous TID WC  . insulin aspart  0-5 Units Subcutaneous QHS  . insulin aspart  3 Units Subcutaneous TID WC  . insulin detemir  5 Units Subcutaneous QHS  . levothyroxine  125 mcg Oral QAC breakfast  . Living Better with Heart Failure Book   Does not apply Once  . pantoprazole  40 mg Oral Q1200  . rosuvastatin  40 mg Oral q1800    Continuous Infusions:   Gladine Plude A. Jimmye Norman, RD, LDN, CDE Pager: [phone] After hours Pager: [phone]

## 2014-09-19 NOTE — Progress Notes (Signed)
Physical Therapy Evaluation Patient Details Name: Bridget Martinez MRN: [id number] DOB: October 01, 1950 Today's Date: 09/19/2014   History of Present Illness  Patient is a 64 yo female admitted 09/15/14 with acute encephalopathy, DKA, intubated 09/15/14.  Noted Lt medial thigh abscess, now s/p I&D.   PMH:  DM, HTN, CAD, MI, CABG, HLD, HTN, CHF, CKD, depression, similar recent hospitalization 07/26/14-07/31/14.  Clinical Impression  Patient presents with problems listed below.  Will benefit from acute PT to maximize independence prior to discharge home with husband.    Follow Up Recommendations Home health PT;Supervision/Assistance - 24 hour    Equipment Recommendations  None recommended by PT    Recommendations for Other Services       Precautions / Restrictions Precautions Precautions: Fall Restrictions Weight Bearing Restrictions: No      Mobility  Bed Mobility Overal bed mobility: Needs Assistance Bed Mobility: Sit to Supine       Sit to supine: Min guard   General bed mobility comments: No physical assist needed.  Assist for safety.  Transfers Overall transfer level: Needs assistance Equipment used: None Transfers: Sit to/from Stand Sit to Stand: Min assist         General transfer comment: Verbal cues for hand placement and to scoot to edge of chair prior to standing.  Assist to rise to standing, and for balance  Ambulation/Gait Ambulation/Gait assistance: Min assist Ambulation Distance (Feet): 220 Feet Assistive device: None;Rolling walker (2 wheeled) Gait Pattern/deviations: Step-through pattern;Decreased stride length;Shuffle;Trunk flexed Gait velocity: Decreased Gait velocity interpretation: Below normal speed for age/gender General Gait Details: Attempted ambulation without assistive device for 15'.  Patient very unsteady, reaching for bed and sink.  Instructed patient on safe use of RW.  Balance improved requiring min assist only during turns.  Cues to stand  upright and look forward during gait.  Also to avoid getting close to obstacles/walls with RW - ran into door jam with RW.  Stairs            Wheelchair Mobility    Modified Rankin (Stroke Patients Only)       Balance Overall balance assessment: Needs assistance         Standing balance support: No upper extremity supported Standing balance-Leahy Scale: Poor                               Pertinent Vitals/Pain Pain Assessment: No/denies pain    Home Living Family/patient expects to be discharged to:: Private residence Living Arrangements: Spouse/significant other Available Help at Discharge: Family;Available 24 hours/day (Husband works in shop behind house.  Can be avail 24 hours) Type of Home: House Home Access: Stairs to enter Entrance Stairs-Rails: None Entrance Stairs-Number of Steps: 3 Home Layout: One level Home Equipment: Walker - 2 wheels;Cane - single point;Wheelchair - [employer];Shower seat      Prior Function Level of Independence: Independent         Comments: Drives     Hand Dominance        Extremity/Trunk Assessment   Upper Extremity Assessment: Generalized weakness (Edema noted BUE's)           Lower Extremity Assessment: Generalized weakness         Communication   Communication: No difficulties  Cognition Arousal/Alertness: Awake/alert Behavior During Therapy: WFL for tasks assessed/performed Overall Cognitive Status: Within Functional Limits for tasks assessed  General Comments      Exercises        Assessment/Plan    PT Assessment Patient needs continued PT services  PT Diagnosis Difficulty walking;Abnormality of gait;Generalized weakness   PT Problem List Decreased strength;Decreased activity tolerance;Decreased balance;Decreased mobility;Decreased knowledge of use of DME;Cardiopulmonary status limiting activity  PT Treatment Interventions DME  instruction;Gait training;Stair training;Functional mobility training;Therapeutic activities;Patient/family education   PT Goals (Current goals can be found in the Care Plan section) Acute Rehab PT Goals Patient Stated Goal: To get stronger PT Goal Formulation: With patient Time For Goal Achievement: 09/26/14 Potential to Achieve Goals: Good    Frequency Min 3X/week   Barriers to discharge        Co-evaluation               End of Session Equipment Utilized During Treatment: Gait belt Activity Tolerance: Patient limited by fatigue Patient left: in bed;with call bell/phone within reach Nurse Communication: Mobility status (Encouraged ambulation in hallway with RW and assist)         Time: 1135-1157 PT Time Calculation (min) (ACUTE ONLY): 22 min   Charges:   PT Evaluation $Initial PT Evaluation Tier I: 1 Procedure PT Treatments $Gait Training: 8-22 mins   PT G Codes:        Despina Pole 09/24/14, 12:20 PM Carita Pian. Sanjuana Kava, [place] Pager [phone]

## 2014-09-19 NOTE — Progress Notes (Signed)
PROGRESS NOTE  [facility] [id number] DOB: 25-Nov-1950 DOA: 09/15/2014 PCP: [name], MD  HPI/Recap of past 3 hours: 64 year old female past mental history of diabetes mellitus poorly controlled with severe hyper and hypoglycemic episodes plus chronic systolic/diastolic heart failure admitted to the critical care service after coming in severe DKA requiring intubation on 1/7. Patient able to be extubated and DKA resolved after several days and transferred to floor on 1/10 with [facility] hospitalists assuming care 1/11  Patient seen today and doing okay. She and her husband seem both very overwhelmed by all of her issues with her DKA. They do not know much about her heart failure in regards to chronic management. Patient herself is overall feeling better and denies any acute complaints.  Assessment/Plan: Active Problems:   CAD, multiple vessel: Stable    DKA (diabetic ketoacidoses) with coma from diabetes mellitus type 1, uncontrolled with neurologic complication: DKA resolved. Patient has severe hyperglycemia but also severely low hypoglycemia. Currently all Levemir held and sugar this morning was at 280. Have gently restarted back Levemir 5 units and sugars are better controlled. She may do much better with this, especially in the setting of abscess being resolved.    CKD (chronic kidney disease), stage III: Looks to be at baseline. This is from chronic diabetes    SIRS (systemic inflammtory response syndrome) secondary to abscess of left thigh: Status post debridement. Likely this was what set off her DKA.    Chronic combined systolic and diastolic congestive heart failure: Looks to overall be euvolemic. Following daily weights. Beta blocker resumed and we'll restart lisinopril as pressure increases    Hypothyroidism: Continue Synthroid   Code Status: Full code  Family Communication: Husband at the bedside  Disposition Plan: Likely discharge tomorrow on lower dose of  Levemir   Consultants: Critical care  Procedures:  Status post debridement of left thigh abscess at bedside on 1/7  Intubated on ventilator 1/7-1/9  Antibiotics:  Zosyn 1/7-1/10  Vancomycin 1/7-1/9   Objective: BP 104/47 mmHg  Pulse 66  Temp(Src) 98.2 F (36.8 C) (Oral)  Resp 19  Ht 5\' 4"  (1.626 m)  Wt 78.7 kg (173 lb 8 oz)  BMI 29.77 kg/m2  SpO2 98%  Intake/Output Summary (Last 24 hours) at 09/19/14 1942 Last data filed at 09/19/14 1500  Gross per 24 hour  Intake    780 ml  Output    750 ml  Net     30 ml   Filed Weights   09/15/14 2329 09/17/14 0500  Weight: 73.6 kg (162 lb 4.1 oz) 78.7 kg (173 lb 8 oz)    Exam:   General:   alert and oriented 3, no acute distress  Cardiovascular: regular rate and rhythm, [id number], 2/6 systolic ejection murmur  Respiratory: clear to auscultation bilaterally  Abdomen: soft, obese, nontender, positive bowel sounds  Musculoskeletal: no clubbing or cyanosis, trace edema   Data Reviewed: Basic Metabolic Panel:  Recent Labs Lab 09/17/14 0349 09/17/14 0930 09/18/14 0330 09/18/14 1840 09/19/14 0441  NA 141 140 146* 141 139  K 4.2 3.7 2.9* 3.7 4.1  CL 109 108 110 107 109  CO2 17* 17* 28 19 25   GLUCOSE 350* 350* 66* 192* 177*  BUN 12 12 11 10 11   CREATININE 1.25* 1.24* 0.97 1.00 0.88  CALCIUM 7.3* 7.7* 8.0* 8.0* 7.8*   Liver Function Tests:  Recent Labs Lab 09/15/14 1800 09/18/14 0330  AST 16 22  ALT 22 14  ALKPHOS 161* 118*  BILITOT  1.7* 0.4  PROT 5.7* 4.3*  ALBUMIN 3.0* 2.0*   No results for input(s): LIPASE, AMYLASE in the last 168 hours. No results for input(s): AMMONIA in the last 168 hours. CBC:  Recent Labs Lab 09/15/14 1800 09/15/14 1943 09/18/14 0330  WBC 12.5* 16.0* 9.0  NEUTROABS 10.6*  --   --   HGB 9.7* 9.9* 8.4*  HCT 30.6* 31.8* 24.9*  MCV 93.9 96.1 85.3  PLT 293 329 210   Cardiac Enzymes:    Recent Labs Lab 09/15/14 1943 09/16/14 0450 09/16/14 0912  TROPONINI 0.03 <0.03  0.03   BNP (last 3 results)  Recent Labs  11/14/13 0405 06/15/14 1035 07/26/14 1800  PROBNP 1470.0* 650.1* 2156.0*   CBG:  Recent Labs Lab 09/18/14 2222 09/18/14 2333 09/19/14 0750 09/19/14 1201 09/19/14 1653  GLUCAP 201* 277* 280* 199* 105*    Recent Results (from the past 240 hour(s))  Urine culture     Status: None   Collection Time: 09/15/14  5:45 PM  Result Value Ref Range Status   Specimen Description URINE, CATHETERIZED  Final   Special Requests NONE  Final   Colony Count NO GROWTH Performed at [facility]   Final   Culture NO GROWTH Performed at [facility]   Final   Report Status 09/16/2014 FINAL  Final  Culture, blood (routine x 2)     Status: None (Preliminary result)   Collection Time: 09/15/14  5:55 PM  Result Value Ref Range Status   Specimen Description BLOOD ARM LEFT  Final   Special Requests BOTTLES DRAWN AEROBIC AND ANAEROBIC 10CC  Final   Culture   Final           BLOOD CULTURE RECEIVED NO GROWTH TO DATE CULTURE WILL BE HELD FOR 5 DAYS BEFORE ISSUING A FINAL NEGATIVE REPORT Performed at [facility]    Report Status PENDING  Incomplete  Culture, blood (routine x 2)     Status: None (Preliminary result)   Collection Time: 09/15/14  6:10 PM  Result Value Ref Range Status   Specimen Description BLOOD ARM LEFT  Final   Special Requests BOTTLES DRAWN AEROBIC AND ANAEROBIC 10CC  Final   Culture   Final           BLOOD CULTURE RECEIVED NO GROWTH TO DATE CULTURE WILL BE HELD FOR 5 DAYS BEFORE ISSUING A FINAL NEGATIVE REPORT Performed at [facility]    Report Status PENDING  Incomplete  MRSA PCR Screening     Status: None   Collection Time: 09/15/14 10:44 PM  Result Value Ref Range Status   MRSA by PCR NEGATIVE NEGATIVE Final    Comment:        The GeneXpert MRSA Assay (FDA approved for NASAL specimens only), is one component of a comprehensive MRSA colonization surveillance program. It is not intended to  diagnose MRSA infection nor to guide or monitor treatment for MRSA infections.      Studies: Dg Chest Port 1 View  09/18/2014   CLINICAL DATA:  Respiratory failure  EXAM: PORTABLE CHEST - 1 VIEW  COMPARISON:  September 15, 2014  FINDINGS: Endotracheal tube and nasogastric tube have been removed. Central catheter tip is in the superior vena cava. No pneumothorax. There is no edema or consolidation. Heart is enlarged with pulmonary vascularity within normal limits. No adenopathy. Patient is status post coronary artery bypass grafting.  IMPRESSION: No pneumothorax.  Stable cardiomegaly.  No edema or consolidation.   Electronically Signed  By: Lowella Grip M.D.   On: 09/18/2014 07:31    Scheduled Meds: . aspirin EC  81 mg Oral Daily  . carvedilol  6.25 mg Oral BID WC  . clopidogrel  75 mg Oral Q breakfast  . enoxaparin (LOVENOX) injection  40 mg Subcutaneous Q24H  . escitalopram  10 mg Oral Daily  . furosemide  40 mg Oral Daily  . insulin aspart  0-15 Units Subcutaneous TID WC  . insulin aspart  0-5 Units Subcutaneous QHS  . insulin aspart  3 Units Subcutaneous TID WC  . insulin detemir  5 Units Subcutaneous QHS  . levothyroxine  125 mcg Oral QAC breakfast  . [START ON 09/20/2014] metoCLOPramide  5 mg Oral TID AC  . pantoprazole  40 mg Oral Q1200  . rosuvastatin  40 mg Oral q1800    Continuous Infusions:    Time spent: 35 minutes  [facility] Pager [phone]. If 7PM-7AM, please contact night-coverage at [email or web address], password [facility] 09/19/2014, 7:42 PM  LOS: 4 days

## 2014-09-19 NOTE — Plan of Care (Signed)
Problem: Phase I Progression Outcomes Goal: Voiding-avoid urinary catheter unless indicated Outcome: Completed/Met Date Met:  09/19/14 Patient has voided since removal of urinary catheter.

## 2014-09-20 DIAGNOSIS — N183 Chronic kidney disease, stage 3 (moderate): Secondary | ICD-10-CM

## 2014-09-20 LAB — GLUCOSE, CAPILLARY
GLUCOSE-CAPILLARY: 166 mg/dL — AB (ref 70–99)
Glucose-Capillary: 137 mg/dL — ABNORMAL HIGH (ref 70–99)
Glucose-Capillary: 151 mg/dL — ABNORMAL HIGH (ref 70–99)
Glucose-Capillary: 172 mg/dL — ABNORMAL HIGH (ref 70–99)

## 2014-09-20 MED ORDER — FUROSEMIDE 10 MG/ML IJ SOLN
20.0000 mg | Freq: Two times a day (BID) | INTRAMUSCULAR | Status: DC
  Administered 2014-09-20 – 2014-09-21 (×2): 20 mg via INTRAVENOUS
  Filled 2014-09-20 (×4): qty 2

## 2014-09-20 NOTE — Progress Notes (Signed)
Physical Therapy Treatment Patient Details Name: Bridget Martinez MRN: [id number] DOB: 08-Jan-1951 Today's Date: 09/20/2014    History of Present Illness Patient is a 64 yo female admitted 09/15/14 with acute encephalopathy, DKA, intubated 09/15/14.  Noted Lt medial thigh abscess, now s/p I&D.   PMH:  DM, HTN, CAD, MI, CABG, HLD, HTN, CHF, CKD, depression, similar recent hospitalization 07/26/14-07/31/14.    PT Comments    Patient is progressing well towards physical therapy goals, ambulating up to 325 feet at a supervision level while using a rolling walker for stability. Practiced navigating stairs today similar to home environment, required min assist and states her daughter will be able to assister her in/out of her home as needed. Patient will continue to benefit from skilled physical therapy services at home with HHPT to further improve independence with functional mobility.    Follow Up Recommendations  Home health PT;Supervision/Assistance - 24 hour     Equipment Recommendations  None recommended by PT    Recommendations for Other Services       Precautions / Restrictions Precautions Precautions: Fall Restrictions Weight Bearing Restrictions: No    Mobility  Bed Mobility                  Transfers Overall transfer level: Needs assistance Equipment used: Rolling walker (2 wheeled) Transfers: Sit to/from Stand Sit to Stand: Supervision         General transfer comment: Supervision for safety. VC for hand placement. Performed from reclining chair x2.  Ambulation/Gait Ambulation/Gait assistance: Supervision Ambulation Distance (Feet): 325 Feet Assistive device: Rolling walker (2 wheeled) Gait Pattern/deviations: Step-through pattern;Decreased stride length;Drifts right/left;Trunk flexed Gait velocity: Decreased   General Gait Details: Drifts to right occasionally, but able to self correct, not bumping into objects in her environment today. VC for upright  posture. No loss of balance noted. Required one rest break to complete distance today, after stair training when returning to room.   Stairs Stairs: Yes Stairs assistance: Min assist Stair Management: One rail Right;No rails;Step to pattern;Forwards;With walker;Backwards Number of Stairs: 3 (x2) General stair comments: Educated on safe stair navigation forward and backwards with a rolling walker for support and min assist to block RW, one trial with hand held assist. Pt states she will have her daughter assist her into and out of her home as her husband is not strong enough to care for her physically.  Wheelchair Mobility    Modified Rankin (Stroke Patients Only)       Balance                                    Cognition Arousal/Alertness: Awake/alert Behavior During Therapy: WFL for tasks assessed/performed Overall Cognitive Status: Within Functional Limits for tasks assessed                      Exercises General Exercises - Lower Extremity Ankle Circles/Pumps: AROM;Both;10 reps;Seated Quad Sets: Strengthening;Both;10 reps;Seated Long Arc Quad: Strengthening;Both;10 reps;Seated Straight Leg Raises: Strengthening;Both;10 reps;Seated Hip Flexion/Marching: Strengthening;Both;10 reps;Seated;Standing Mini-Sqauts: Right;Both;10 reps;Seated    General Comments        Pertinent Vitals/Pain Pain Assessment: No/denies pain    Home Living Family/patient expects to be discharged to:: Private residence Living Arrangements: Spouse/significant other Available Help at Discharge: Family;Available 24 hours/day (Husband works in shop behind house.  Can be avail 24 hours) Type of Home: House Home Access: Stairs to enter Entrance  Stairs-Rails: None Home Layout: One level Home Equipment: Walker - 2 wheels;Cane - single point;Wheelchair - [employer];Shower seat      Prior Function Level of Independence: Independent      Comments: Drives   PT  Goals (current goals can now be found in the care plan section) Acute Rehab PT Goals PT Goal Formulation: With patient Time For Goal Achievement: 09/26/14 Potential to Achieve Goals: Good Progress towards PT goals: Progressing toward goals    Frequency  Min 3X/week    PT Plan Current plan remains appropriate    Co-evaluation             End of Session Equipment Utilized During Treatment: Gait belt Activity Tolerance: Patient tolerated treatment well Patient left: with call bell/phone within reach;in chair     Time: [id number] PT Time Calculation (min) (ACUTE ONLY): 24 min  Charges:  $Gait Training: 8-22 mins $Therapeutic Exercise: 8-22 mins                    G Codes:      Ellouise Newer 10-18-14, 9:53 AM Elayne Snare, [place]

## 2014-09-20 NOTE — Progress Notes (Signed)
Inpatient Diabetes Program Recommendations  AACE/ADA: New Consensus Statement on Inpatient Glycemic Control (2013)  Target Ranges:  Prepandial:   less than 140 mg/dL      Peak postprandial:   less than 180 mg/dL (1-2 hours)      Critically ill patients:  140 - 180 mg/dL     Results for Bridget Martinez, Bridget Martinez (MRN [id number]) as of 09/20/2014 14:05  Ref. Range 09/19/2014 07:50 09/19/2014 12:01 09/19/2014 16:53 09/19/2014 21:49  Glucose-Capillary Latest Range: 70-99 mg/dL 280 (H) 199 (H) 105 (H) 330 (H)    Results for Bridget Martinez, Bridget Martinez (MRN [id number]) as of 09/20/2014 14:05  Ref. Range 09/20/2014 08:25 09/20/2014 11:44  Glucose-Capillary Latest Range: 70-99 mg/dL 137 (H) 166 (H)    Results for Bridget Martinez, Bridget Martinez (MRN [id number]) as of 09/20/2014 14:05  Ref. Range 09/19/2014 11:26  Hgb A1c MFr Bld Latest Range: <5.7 % 11.3 (H)     Current Orders: Levemir 5 units QHS     Novolog Moderate SSI     Novolog 3 units tidwc   **CBGs much improved today on current doses of insulin.  **Spoke to patient about her current A1c of 11.3%.  Explained what an A1c is and what it measures.  Reminded patient that her goal A1c is 7% or less per ADA standards to prevent both acute and long-term complications.  Explained to patient the extreme importance of good glucose control at home.  Encouraged patient to check her CBGs at least tid at home and to record all CBGs in a logbook for her PCP to review.  **Patient told me she has been working closely with Dr. Delrae Rend [facility]) to improve her blood sugar control.  Patient sated she has had a lot of infections and is not surprised that her A1c is so high.  Has follow up appointment with Dr. Buddy Duty on 10/14/13.    Will follow [name] RN, MSN, CDE Diabetes Coordinator Inpatient Diabetes Program Team Pager: [phone] (8a-10p)

## 2014-09-20 NOTE — Progress Notes (Signed)
PROGRESS NOTE  Bridget Martinez [id number] DOB: [birth year] DOA: 09/15/2014 PCP: [name], MD  HPI/Recap of past 2 hours: 64 year old female past mental history of diabetes mellitus poorly controlled with severe hyper and hypoglycemic episodes plus chronic systolic/diastolic heart failure admitted to the critical care service after coming in severe DKA requiring intubation on 1/7. Patient able to be extubated and DKA resolved after several days and transferred to floor on 1/10 with [facility] hospitalists assuming care 1/11.  Patient  and her husband seem both very overwhelmed by all of her issues with her DKA. They do not know much about her heart failure in regards to chronic management.  Patient's Levemir initially held and restarted last night at 5 units daily at bedtime. Overall, her sugars have been much improved. She herself denies any complaints. No shortness of breath or weakness.   Assessment/Plan: Active Problems:   CAD, multiple vessel: Stable    DKA (diabetic ketoacidoses) with coma from diabetes mellitus type 1, uncontrolled with neurologic complication: DKA resolved. Patient has severe hyperglycemia with sugar in the 300s-400s but also severely low hypoglycemia, sugar at 20!. . Have gently restarted back Levemir 5 units and sugars are better controlled. She may do much better with this, especially in the setting of abscess being resolved.  We'll observe for the next 24 hours and make adjustments accordingly. Patient will likely go home on a far less dose then 14 units twice a day of what she was previously on.    CKD (chronic kidney disease), stage III: Looks to be at baseline. This is from chronic diabetes    SIRS (systemic inflammtory response syndrome) secondary to abscess of left thigh: Status post debridement. Likely this was what set off her DKA. Resolved.    Chronic combined systolic and diastolic congestive heart failure: In comparison of daily weights, patient is up 4  pounds from admission, but overall not to volume overloaded. Following daily weights. Beta blocker resumed and we'll restart lisinopril as pressure increases or at discharge. Have given additional doses of Lasix, follow-up basic metabolic panel in the morning    Hypothyroidism: Continue Synthroid   Code Status: Full code  Family Communication: Husband at the bedside  Disposition Plan: Likely discharge tomorrow on lower dose of Levemir   Consultants: Critical care  Procedures:  Status post debridement of left thigh abscess at bedside on 1/7  Intubated on ventilator 1/7-1/9  Antibiotics:  Zosyn 1/7-1/10  Vancomycin 1/7-1/9   Objective: BP 98/42 mmHg  Pulse 62  Temp(Src) 98.1 F (36.7 C) (Oral)  Resp 20  Ht 5\' 4"  (1.626 m)  Wt 75.7 kg (166 lb 14.2 oz)  BMI 28.63 kg/m2  SpO2 100%  Intake/Output Summary (Last 24 hours) at 09/20/14 1525 Last data filed at 09/20/14 0900  Gross per 24 hour  Intake    300 ml  Output   1870 ml  Net  -1570 ml   Filed Weights   09/17/14 0500 09/19/14 1940 09/20/14 0608  Weight: 78.7 kg (173 lb 8 oz) 80.2 kg (176 lb 12.9 oz) 75.7 kg (166 lb 14.2 oz)    Exam:   General:   alert and oriented 3, no acute distress  Cardiovascular: regular rate and rhythm, [id number], 2/6 systolic ejection murmur  Respiratory: clear to auscultation bilaterally  Abdomen: soft, obese, nontender, positive bowel sounds  Musculoskeletal: no clubbing or cyanosis, minimal edema  Data Reviewed: Basic Metabolic Panel:  Recent Labs Lab 09/17/14 0349 09/17/14 0930 09/18/14 0330 09/18/14 1840 09/19/14 0441  NA 141 140 146* 141 139  K 4.2 3.7 2.9* 3.7 4.1  CL 109 108 110 107 109  CO2 17* 17* 28 19 25   GLUCOSE 350* 350* 66* 192* 177*  BUN 12 12 11 10 11   CREATININE 1.25* 1.24* 0.97 1.00 0.88  CALCIUM 7.3* 7.7* 8.0* 8.0* 7.8*   Liver Function Tests:  Recent Labs Lab 09/15/14 1800 09/18/14 0330  AST 16 22  ALT 22 14  ALKPHOS 161* 118*  BILITOT  1.7* 0.4  PROT 5.7* 4.3*  ALBUMIN 3.0* 2.0*   No results for input(s): LIPASE, AMYLASE in the last 168 hours. No results for input(s): AMMONIA in the last 168 hours. CBC:  Recent Labs Lab 09/15/14 1800 09/15/14 1943 09/18/14 0330  WBC 12.5* 16.0* 9.0  NEUTROABS 10.6*  --   --   HGB 9.7* 9.9* 8.4*  HCT 30.6* 31.8* 24.9*  MCV 93.9 96.1 85.3  PLT 293 329 210   Cardiac Enzymes:    Recent Labs Lab 09/15/14 1943 09/16/14 0450 09/16/14 0912  TROPONINI 0.03 <0.03 0.03   BNP (last 3 results)  Recent Labs  11/14/13 0405 06/15/14 1035 07/26/14 1800  PROBNP 1470.0* 650.1* 2156.0*   CBG:  Recent Labs Lab 09/19/14 1201 09/19/14 1653 09/19/14 2149 09/20/14 0825 09/20/14 1144  GLUCAP 199* 105* 330* 137* 166*    Recent Results (from the past 240 hour(s))  Urine culture     Status: None   Collection Time: 09/15/14  5:45 PM  Result Value Ref Range Status   Specimen Description URINE, CATHETERIZED  Final   Special Requests NONE  Final   Colony Count NO GROWTH Performed at [facility]   Final   Culture NO GROWTH Performed at [facility]   Final   Report Status 09/16/2014 FINAL  Final  Culture, blood (routine x 2)     Status: None (Preliminary result)   Collection Time: 09/15/14  5:55 PM  Result Value Ref Range Status   Specimen Description BLOOD ARM LEFT  Final   Special Requests BOTTLES DRAWN AEROBIC AND ANAEROBIC 10CC  Final   Culture   Final           BLOOD CULTURE RECEIVED NO GROWTH TO DATE CULTURE WILL BE HELD FOR 5 DAYS BEFORE ISSUING A FINAL NEGATIVE REPORT Performed at [facility]    Report Status PENDING  Incomplete  Culture, blood (routine x 2)     Status: None (Preliminary result)   Collection Time: 09/15/14  6:10 PM  Result Value Ref Range Status   Specimen Description BLOOD ARM LEFT  Final   Special Requests BOTTLES DRAWN AEROBIC AND ANAEROBIC 10CC  Final   Culture   Final           BLOOD CULTURE RECEIVED NO GROWTH TO  DATE CULTURE WILL BE HELD FOR 5 DAYS BEFORE ISSUING A FINAL NEGATIVE REPORT Performed at [facility]    Report Status PENDING  Incomplete  MRSA PCR Screening     Status: None   Collection Time: 09/15/14 10:44 PM  Result Value Ref Range Status   MRSA by PCR NEGATIVE NEGATIVE Final    Comment:        The GeneXpert MRSA Assay (FDA approved for NASAL specimens only), is one component of a comprehensive MRSA colonization surveillance program. It is not intended to diagnose MRSA infection nor to guide or monitor treatment for MRSA infections.      Studies: No results found.  Scheduled Meds: . aspirin  EC  81 mg Oral Daily  . carvedilol  6.25 mg Oral BID WC  . clopidogrel  75 mg Oral Q breakfast  . enoxaparin (LOVENOX) injection  40 mg Subcutaneous Q24H  . escitalopram  10 mg Oral Daily  . furosemide  20 mg Intravenous Q12H  . insulin aspart  0-15 Units Subcutaneous TID WC  . insulin aspart  0-5 Units Subcutaneous QHS  . insulin aspart  3 Units Subcutaneous TID WC  . insulin detemir  5 Units Subcutaneous QHS  . levothyroxine  125 mcg Oral QAC breakfast  . metoCLOPramide  5 mg Oral TID AC  . pantoprazole  40 mg Oral Q1200  . rosuvastatin  40 mg Oral q1800    Continuous Infusions:    Time spent: 15 minutes  [facility] Pager [phone]. If 7PM-7AM, please contact night-coverage at [email or web address], password [facility] 09/20/2014, 3:25 PM  LOS: 5 days

## 2014-09-21 DIAGNOSIS — E1011 Type 1 diabetes mellitus with ketoacidosis with coma: Secondary | ICD-10-CM

## 2014-09-21 DIAGNOSIS — E162 Hypoglycemia, unspecified: Secondary | ICD-10-CM

## 2014-09-21 LAB — BASIC METABOLIC PANEL
Anion gap: 7 (ref 5–15)
BUN: 7 mg/dL (ref 6–23)
CHLORIDE: 95 meq/L — AB (ref 96–112)
CO2: 36 mmol/L — ABNORMAL HIGH (ref 19–32)
Calcium: 8.1 mg/dL — ABNORMAL LOW (ref 8.4–10.5)
Creatinine, Ser: 1.02 mg/dL (ref 0.50–1.10)
GFR calc Af Amer: 66 mL/min — ABNORMAL LOW (ref >90)
GFR calc non Af Amer: 57 mL/min — ABNORMAL LOW (ref >90)
GLUCOSE: 81 mg/dL (ref 70–99)
POTASSIUM: 3.3 mmol/L — AB (ref 3.5–5.1)
Sodium: 138 mmol/L (ref 135–145)

## 2014-09-21 LAB — GLUCOSE, CAPILLARY
Glucose-Capillary: 85 mg/dL (ref 70–99)
Glucose-Capillary: 128 mg/dL — ABNORMAL HIGH (ref 70–99)

## 2014-09-21 MED ORDER — INSULIN ASPART 100 UNIT/ML ~~LOC~~ SOLN: 3.0000 [IU] | Freq: Three times a day (TID) | SUBCUTANEOUS | Status: DC

## 2014-09-21 MED ORDER — INSULIN DETEMIR 100 UNIT/ML ~~LOC~~ SOLN: 7.0000 [IU] | Freq: Every day | SUBCUTANEOUS | Status: DC

## 2014-09-21 MED ORDER — POTASSIUM CHLORIDE CRYS ER 20 MEQ PO TBCR
40.0000 meq | EXTENDED_RELEASE_TABLET | Freq: Once | ORAL | Status: AC
  Administered 2014-09-21: 40 meq via ORAL
  Filled 2014-09-21: qty 2

## 2014-09-21 NOTE — Progress Notes (Signed)
Nsg Discharge Note  Admit Date:  09/15/2014 Discharge date: 09/21/2014   Bridget Martinez to be D/C'd Home per MD order.  AVS completed.  Copy for chart, and copy for patient signed, and dated. Patient/caregiver able to verbalize understanding.  Discharge Medication:   Medication List    TAKE these medications        alendronate 70 MG tablet  Commonly known as:  FOSAMAX  Take 70 mg by mouth once a week.     aspirin EC 81 MG tablet  Take 1 tablet (81 mg total) by mouth daily.     carvedilol 6.25 MG tablet  Commonly known as:  COREG  Take 1 tablet (6.25 mg total) by mouth 2 (two) times daily with a meal.     clopidogrel 75 MG tablet  Commonly known as:  PLAVIX  Take 1 tablet (75 mg total) by mouth daily with breakfast.     enalapril 2.5 MG tablet  Commonly known as:  VASOTEC  Take 2.5 mg by mouth 3 (three) times daily.     escitalopram 10 MG tablet  Commonly known as:  LEXAPRO  Take 1 tablet (10 mg total) by mouth daily.     furosemide 40 MG tablet  Commonly known as:  LASIX  Take 1 tablet (40 mg total) by mouth daily.     hyoscyamine 0.125 MG tablet  Commonly known as:  LEVSIN, ANASPAZ  Take 0.125 mg by mouth every 4 (four) hours as needed for bladder spasms.     insulin aspart 100 UNIT/ML injection  Commonly known as:  novoLOG  Inject 3 Units into the skin 3 (three) times daily with meals.     insulin detemir 100 UNIT/ML injection  Commonly known as:  LEVEMIR  Inject 0.07 mLs (7 Units total) into the skin at bedtime.     levothyroxine 125 MCG tablet  Commonly known as:  SYNTHROID, LEVOTHROID  Take 1 tablet (125 mcg total) by mouth daily.     metoCLOPramide 10 MG tablet  Commonly known as:  REGLAN  Take 1 tablet (10 mg total) by mouth 4 (four) times daily -  before meals and at bedtime.     ondansetron 8 MG disintegrating tablet  Commonly known as:  ZOFRAN ODT  Take 1 tablet (8 mg total) by mouth every 8 (eight) hours as needed for nausea or vomiting.     pantoprazole 40 MG tablet  Commonly known as:  PROTONIX  Take 1 tablet (40 mg total) by mouth daily.     rosuvastatin 40 MG tablet  Commonly known as:  CRESTOR  Take 1 tablet (40 mg total) by mouth daily.     spironolactone 25 MG tablet  Commonly known as:  ALDACTONE  Take 12.5 mg by mouth daily.     Vitamin D (Ergocalciferol) 50000 UNITS Caps capsule  Commonly known as:  DRISDOL  Take 1 capsule by mouth once a week. On saturday        Discharge Assessment: Filed Vitals:   09/21/14 1405  BP: 102/43  Pulse: 69  Temp: 98.2 F (36.8 C)  Resp: 16   Skin clean, dry and intact without evidence of skin break down, no evidence of skin tears noted. Does have healing incision on L upper thigh from abscess drainage.  IV catheter discontinued intact. Site without signs and symptoms of complications - no redness or edema noted at insertion site, patient denies c/o pain - only slight tenderness at site.  Dressing with slight pressure  applied.  D/c Instructions-Education: Discharge instructions given to patient/family with verbalized understanding. D/c education completed with patient/family including follow up instructions, medication list, d/c activities limitations if indicated, with other d/c instructions as indicated by MD - patient able to verbalize understanding, all questions fully answered. Patient instructed to return to ED, call 911, or call MD for any changes in condition.  Patient escorted via [place], and D/C home via private auto.  Ansley Stanwood Margaretha Sheffield, RN 09/21/2014 2:09 PM

## 2014-09-21 NOTE — Discharge Instructions (Signed)
Diabetic Ketoacidosis °Diabetic ketoacidosis (DKA) is a life-threatening complication of type 1 diabetes. It must be quickly recognized and treated. Treatment requires hospitalization. °CAUSES  °When there is no insulin in the body, glucose (sugar) cannot be used, and the body breaks down fat for energy. When fat breaks down, acids (ketones) build up in the blood. Very high levels of glucose and high levels of acids lead to severe loss of body fluids (dehydration) and other dangerous chemical changes. This stresses your vital organs and can cause coma or death. °SIGNS AND SYMPTOMS  °· Tiredness (fatigue). °· Weight loss. °· Excessive thirst. °· Ketones in your urine. °· Light-headedness. °· Fruity or sweet smelling breath. °· Excessive urination. °· Visual changes. °· Confusion or irritability. °· Nausea or vomiting. °· Rapid breathing. °· Stomachache or abdominal pain. °DIAGNOSIS  °Your health care provider will diagnose DKA based on your history, physical exam, and blood tests. The health care provider will check to see if you have another illness that caused you to go into DKA. Most of this will be done quickly in an emergency room. °TREATMENT  °· Fluid replacement to correct dehydration. °· Insulin. °· Correction of electrolytes, such as potassium and sodium. °· Antibiotic medicines. °PREVENTION °· Always take your insulin. Do not skip your insulin injections. °· If you are sick, treat yourself quickly. Your body often needs more insulin to fight the illness. °· Check your blood glucose regularly. °· Check urine ketones if your blood glucose is greater than 240 milligrams per deciliter (mg/dL). °· Do not use outdated (expired) insulin. °· If your blood glucose is high, drink plenty of fluids. This helps flush out ketones. °HOME CARE INSTRUCTIONS  °· If you are sick, follow the advice of your health care provider. °· To prevent dehydration, drink enough water and fluids to keep your urine clear or pale  yellow. °¨ If you cannot eat, alternate between drinking fluids with sugar (soda, juices, flavored gelatin) and salty fluids (broth, bouillon). °¨ If you can eat, follow your usual diet and drink sugar-free liquids (water, diet drinks). °· Always take your usual dose of insulin. If you cannot eat or if your glucose is getting too low, call your health care provider for further instructions. °· Continue to monitor your blood or urine ketones every 3-4 hours around the clock. Set your alarm clock or have someone wake you up. If you are too sick, have someone test it for you. °· Rest and avoid exercise. °SEEK MEDICAL CARE IF:  °· You have a fever. °· You have ketones in your urine, or your blood glucose is higher than a level your health care provider suggests. You may need extra insulin. Call your health care provider if you need advice on adjusting your insulin. °· You cannot drink at least a tablespoon (15 mL) of fluid every 15-20 minutes. °· You have been vomiting for more than 2 hours. °· You have symptoms of DKA: °¨ Fruity smelling breath. °¨ Breathing faster or slower. °¨ Becoming very sleepy. °SEEK IMMEDIATE MEDICAL CARE IF:  °· You have signs of dehydration: °¨ Decreased urination. °¨ Increased thirst. °¨ Dry skin and mouth. °¨ Light-headedness. °· Your blood glucose is very high (as advised by your health care provider) twice in a row. °· You faint. °· You have chest pain or trouble breathing. °· You have a sudden, severe headache. °· You have sudden weakness in one arm or one leg. °· You have sudden trouble speaking or swallowing. °· You   have vomiting or diarrhea that is getting worse after 3 hours. °· You have abdominal pain. °MAKE SURE YOU:  °· Understand these instructions. °· Will watch your condition. °· Will get help right away if you are not doing well or get worse. °Document Released: 08/23/2000 Document Revised: 08/31/2013 Document Reviewed: 03/01/2009 °[employer]® Patient Information ©2015 [employer]  [employer]. This information is not intended to replace advice given to you by your health care provider. Make sure you discuss any questions you have with your health care provider. ° °

## 2014-09-21 NOTE — Discharge Summary (Addendum)
Physician Discharge Summary  Bridget Martinez [id number] DOB: March 11, 1951 DOA: 09/15/2014  PCP: [name], MD  Admit date: 09/15/2014 Discharge date: 09/21/2014  Time spent: 35 minutes  Recommendations for Outpatient Follow-up:  1. Discharge home with home health RN and PT 2. Follow-up appointment scheduled with PCP in 2 weeks 3. Patient has follow-up with her endocrinologist on 10/14/2014 4. Follow-up with heart failure clinic as outpatient  Discharge Diagnoses:  Principal Problem:   DKA (diabetic ketoacidoses)   Active Problems:   Abscess of left thigh   SIRS (systemic inflammatory response syndrome)   Chronic combined systolic and diastolic congestive heart failure   CAD, multiple vessel   CKD (chronic kidney disease), stage III   Diabetic gastroparesis   DM (diabetes mellitus), type 1, uncontrolled w/neurologic complication   Hypoglycemia   Hypothyroidism   Discharge Condition: Fair  Diet recommendation: Heart healthy/diabetic  Filed Weights   09/19/14 1940 09/20/14 0608 09/21/14 0706  Weight: 80.2 kg (176 lb 12.9 oz) 75.7 kg (166 lb 14.2 oz) 73.4 kg (161 lb 13.1 oz)    History of present illness:  Please refer to admission H&P for details, but in brief, 64 year old female with a history of poorly controlled type 1 diabetes mellitus with episodes of hyper and hypoglycemia, chronic systolic/diastolic CHF with EF of [id number], coronary artery disease, CKD stage III, presented with left mid thigh abscess which was drained in the ED. She was found to have severe DKA with coma requiring intubation on 1/7 and admitted to the ICU. Patient was subsequently extubated and her DKA resolved. She was then transferred to medical floor and followed by [facility] since 1/11. Patient has had episodes of elevated blood sugar in 300s-400s alternating with hypoglycemia with blood sugar as low as 20. Her dose of Levemir has been started at a lower dose along with low-dose pre-meal  aspart.  Hospital Course:  Uncontrolled type 1 diabetes mellitus with DKA with coma -Possibly triggered by a left thigh abscess Patient required intubation on admission and placed on insulin drip. DKA now resolved with episodes of hyper and hypoglycemia. Levemir restarted at 5 units at bedtime along with pre-meal aspart coverage of 3 units 3 times a day. Fingersticks have been in the range of mid 150s for the past 24 hours. Patient will be discharged on bedtime Levemir 7 units daily along with pre-meal aspart 3 units 3 times a day with meals. -Patient instructed to monitor her fingersticks closely at home and adhere to her diet. Will arrange for home health RN. She will follow up with her endocrinologist in one month.  SIRS Secondary to left thigh abscess. This likely contributed to her DKA. Patient had I&D done in the ED on admission and received a few days of IV antibiotics. The wound now appears to be clean and packing will be removed prior to discharge with clean dressing as outpatient. Will arrange for home health RN for dressing.  Chronic mild systolic and diastolic CHF Clinically euvolemic. Resume home dose beta blocker, lisinopril, Lasix and Aldactone on discharge. Continue aspirin.  CAD, multiple vessel s/p CABG Continue aspirin and Plavix. Continue statin.  Hypothyroidism Continue Synthroid  CK D stage III Likely secondary to diabetic nephropathy. Stable at baseline.  Hypokalemia Replenished  Diabetic gastroparesis Stable. Continue when necessary Reglan and Zofran  GERD Continue PPI  Patient clinically stable for discharge with home health. Seen by physical therapy and recommended home health PT  Procedures:  None  Consultations:  PCCM  Discharge Exam: Filed  Vitals:   09/21/14 0534  BP: 119/52  Pulse: 65  Temp: 98.1 F (36.7 C)  Resp: 18    General: Elderly female in no acute distress HEENT: No pallor, moist oral mucosa Chest: Clear to auscultation  bilaterally, no added sounds CVS: Normal S1 and S2, no murmurs Abdomen: Soft, nondistended, nontender, bowel sounds present Extremities: Warm, no edema, clean wound over left mid thigh with packing CNS: Alert and oriented  Discharge Instructions    Current Discharge Medication List    CONTINUE these medications which have CHANGED   Details  insulin aspart (NOVOLOG) 100 UNIT/ML injection Inject 3 Units into the skin 3 (three) times daily with meals. Qty: 10 mL, Refills: 11    insulin detemir (LEVEMIR) 100 UNIT/ML injection Inject 0.07 mLs (7 Units total) into the skin at bedtime. Qty: 10 mL, Refills: 0      CONTINUE these medications which have NOT CHANGED   Details  alendronate (FOSAMAX) 70 MG tablet Take 70 mg by mouth once a week.     carvedilol (COREG) 6.25 MG tablet Take 1 tablet (6.25 mg total) by mouth 2 (two) times daily with a meal. Qty: 60 tablet, Refills: 2    clopidogrel (PLAVIX) 75 MG tablet Take 1 tablet (75 mg total) by mouth daily with breakfast. Qty: 30 tablet, Refills: 2    enalapril (VASOTEC) 2.5 MG tablet Take 2.5 mg by mouth 3 (three) times daily.    escitalopram (LEXAPRO) 10 MG tablet Take 1 tablet (10 mg total) by mouth daily. Qty: 30 tablet, Refills: 11   Associated Diagnoses: Depression    furosemide (LASIX) 40 MG tablet Take 1 tablet (40 mg total) by mouth daily. Qty: 30 tablet, Refills: 3    hyoscyamine (LEVSIN, ANASPAZ) 0.125 MG tablet Take 0.125 mg by mouth every 4 (four) hours as needed for bladder spasms.    levothyroxine (SYNTHROID, LEVOTHROID) 125 MCG tablet Take 1 tablet (125 mcg total) by mouth daily. Qty: 30 tablet, Refills: 11   Associated Diagnoses: Unspecified hypothyroidism    metoCLOPramide (REGLAN) 10 MG tablet Take 1 tablet (10 mg total) by mouth 4 (four) times daily -  before meals and at bedtime. Qty: 60 tablet, Refills: 1    ondansetron (ZOFRAN ODT) 8 MG disintegrating tablet Take 1 tablet (8 mg total) by mouth every 8  (eight) hours as needed for nausea or vomiting. Qty: 20 tablet, Refills: 1    pantoprazole (PROTONIX) 40 MG tablet Take 1 tablet (40 mg total) by mouth daily. Qty: 30 tablet, Refills: 3    rosuvastatin (CRESTOR) 40 MG tablet Take 1 tablet (40 mg total) by mouth daily. Qty: 30 tablet, Refills: 11   Associated Diagnoses: Other and unspecified hyperlipidemia    spironolactone (ALDACTONE) 25 MG tablet Take 12.5 mg by mouth daily.    aspirin EC 81 MG tablet Take 1 tablet (81 mg total) by mouth daily. Qty: 30 tablet, Refills: 3    Vitamin D, Ergocalciferol, (DRISDOL) 50000 UNITS CAPS capsule Take 1 capsule by mouth once a week. On saturday      STOP taking these medications     aspirin 325 MG tablet        Allergies  Allergen Reactions  . Sulfa Antibiotics Anaphylaxis and Swelling    Stiff neck also    Follow-up Information    Follow up with [facility].   Why:  hhrn, hhpt   Contact information:   [address] [place] [address] [phone]  Follow up with [name], MD On 09/30/2014.   Specialty:  Family Medicine   Why:  Appointment with Dr. Laurance Flatten is on Jan 22 at [facility] information:   [place] [address] [phone]        The results of significant diagnostics from this hospitalization (including imaging, microbiology, ancillary and laboratory) are listed below for reference.    Significant Diagnostic Studies: Dg Chest Port 1 View  09/18/2014   CLINICAL DATA:  Respiratory failure  EXAM: PORTABLE CHEST - 1 VIEW  COMPARISON:  September 15, 2014  FINDINGS: Endotracheal tube and nasogastric tube have been removed. Central catheter tip is in the superior vena cava. No pneumothorax. There is no edema or consolidation. Heart is enlarged with pulmonary vascularity within normal limits. No adenopathy. Patient is status post coronary artery bypass grafting.  IMPRESSION: No pneumothorax.  Stable cardiomegaly.  No edema  or consolidation.   Electronically Signed   By: Lowella Grip M.D.   On: 09/18/2014 07:31   Portable Chest Xray  09/15/2014   CLINICAL DATA:  Assess for endotracheal tube and central line placement.  EXAM: PORTABLE CHEST - 1 VIEW  COMPARISON:  None.  FINDINGS: The heart size is enlarged. Mediastinal contour is normal. Endotracheal tube is identified with distal tip probably in the carina. Retraction by 4 cm is recommended. Nasogastric tube is identified with distal tip not included on the film but is at least in the stomach. A left jugular central venous line is identified with distal tip is. Neck cava. There is no pneumothorax. There is no focal infiltrate, pulmonary edema, or pleural effusion. The osseous structures are stable.  IMPRESSION: Endotracheal tube with distal tip probably in the carina. Retraction by 4 cm is recommended.  Left jugular central venous line in good position. There is no pneumothorax. Nasogastric tube is identified with distal tip not included on the film but is at least in the stomach.  These results will be called to the ordering clinician or representative by the Radiologist Assistant, and communication documented in the PACS or zVision Dashboard.   Electronically Signed   By: Abelardo Diesel M.D.   On: 09/15/2014 19:18   Dg Chest Port 1 View  09/15/2014   CLINICAL DATA:  Diabetes. Hyperglycemia. Elevated blood sugar. Blood sugar of 600.  EXAM: PORTABLE CHEST - 1 VIEW  COMPARISON:  07/29/2014.  FINDINGS: Low volume chest. CABG. Monitoring leads project over the chest. Patient is rotated to the RIGHT. Mild basilar atelectasis. No focal consolidation. Cardiopericardial silhouette is within normal limits for low volumes.  IMPRESSION: Low volume chest.   Electronically Signed   By: Dereck Ligas M.D.   On: 09/15/2014 17:29    Microbiology: Recent Results (from the past 240 hour(s))  Urine culture     Status: None   Collection Time: 09/15/14  5:45 PM  Result Value Ref Range  Status   Specimen Description URINE, CATHETERIZED  Final   Special Requests NONE  Final   Colony Count NO GROWTH Performed at [facility]   Final   Culture NO GROWTH Performed at [facility]   Final   Report Status 09/16/2014 FINAL  Final  Culture, blood (routine x 2)     Status: None (Preliminary result)   Collection Time: 09/15/14  5:55 PM  Result Value Ref Range Status   Specimen Description BLOOD ARM LEFT  Final   Special Requests BOTTLES DRAWN AEROBIC AND ANAEROBIC 10CC  Final  Culture   Final           BLOOD CULTURE RECEIVED NO GROWTH TO DATE CULTURE WILL BE HELD FOR 5 DAYS BEFORE ISSUING A FINAL NEGATIVE REPORT Performed at [facility]    Report Status PENDING  Incomplete  Culture, blood (routine x 2)     Status: None (Preliminary result)   Collection Time: 09/15/14  6:10 PM  Result Value Ref Range Status   Specimen Description BLOOD ARM LEFT  Final   Special Requests BOTTLES DRAWN AEROBIC AND ANAEROBIC 10CC  Final   Culture   Final           BLOOD CULTURE RECEIVED NO GROWTH TO DATE CULTURE WILL BE HELD FOR 5 DAYS BEFORE ISSUING A FINAL NEGATIVE REPORT Performed at [facility]    Report Status PENDING  Incomplete  MRSA PCR Screening     Status: None   Collection Time: 09/15/14 10:44 PM  Result Value Ref Range Status   MRSA by PCR NEGATIVE NEGATIVE Final    Comment:        The GeneXpert MRSA Assay (FDA approved for NASAL specimens only), is one component of a comprehensive MRSA colonization surveillance program. It is not intended to diagnose MRSA infection nor to guide or monitor treatment for MRSA infections.      Labs: Basic Metabolic Panel:  Recent Labs Lab 09/17/14 0930 09/18/14 0330 09/18/14 1840 09/19/14 0441 09/21/14 0512  NA 140 146* 141 139 138  K 3.7 2.9* 3.7 4.1 3.3*  CL 108 110 107 109 95*  CO2 17* 28 19 25  36*  GLUCOSE 350* 66* 192* 177* 81  BUN 12 11 10 11 7   CREATININE 1.24* 0.97 1.00 0.88 1.02   CALCIUM 7.7* 8.0* 8.0* 7.8* 8.1*   Liver Function Tests:  Recent Labs Lab 09/15/14 1800 09/18/14 0330  AST 16 22  ALT 22 14  ALKPHOS 161* 118*  BILITOT 1.7* 0.4  PROT 5.7* 4.3*  ALBUMIN 3.0* 2.0*   No results for input(s): LIPASE, AMYLASE in the last 168 hours. No results for input(s): AMMONIA in the last 168 hours. CBC:  Recent Labs Lab 09/15/14 1800 09/15/14 1943 09/18/14 0330  WBC 12.5* 16.0* 9.0  NEUTROABS 10.6*  --   --   HGB 9.7* 9.9* 8.4*  HCT 30.6* 31.8* 24.9*  MCV 93.9 96.1 85.3  PLT 293 329 210   Cardiac Enzymes:  Recent Labs Lab 09/15/14 1943 09/16/14 0450 09/16/14 0912  TROPONINI 0.03 <0.03 0.03   BNP: BNP (last 3 results)  Recent Labs  11/14/13 0405 06/15/14 1035 07/26/14 1800  PROBNP 1470.0* 650.1* 2156.0*   CBG:  Recent Labs Lab 09/20/14 0825 09/20/14 1144 09/20/14 1722 09/20/14 2106 09/21/14 0805  GLUCAP 137* 166* 151* 172* 85       Signed:  Xaidyn Kepner  [facility] 09/21/2014, 11:50 AM

## 2014-09-22 ENCOUNTER — Encounter (HOSPITAL_COMMUNITY): Payer: Self-pay | Admitting: Cardiothoracic Surgery

## 2014-09-22 LAB — CULTURE, BLOOD (ROUTINE X 2)
CULTURE: NO GROWTH
Culture: NO GROWTH

## 2014-09-23 ENCOUNTER — Encounter: Payer: Self-pay | Admitting: *Deleted

## 2014-09-23 ENCOUNTER — Encounter: Payer: BC Managed Care – PPO | Attending: Internal Medicine | Admitting: *Deleted

## 2014-09-23 DIAGNOSIS — E1143 Type 2 diabetes mellitus with diabetic autonomic (poly)neuropathy: Secondary | ICD-10-CM

## 2014-09-23 DIAGNOSIS — E1043 Type 1 diabetes mellitus with diabetic autonomic (poly)neuropathy: Secondary | ICD-10-CM | POA: Insufficient documentation

## 2014-09-23 DIAGNOSIS — Z794 Long term (current) use of insulin: Secondary | ICD-10-CM | POA: Insufficient documentation

## 2014-09-23 DIAGNOSIS — K3184 Gastroparesis: Secondary | ICD-10-CM | POA: Diagnosis not present

## 2014-09-23 DIAGNOSIS — E1049 Type 1 diabetes mellitus with other diabetic neurological complication: Secondary | ICD-10-CM

## 2014-09-23 DIAGNOSIS — E1065 Type 1 diabetes mellitus with hyperglycemia: Secondary | ICD-10-CM

## 2014-09-23 DIAGNOSIS — IMO0002 Reserved for concepts with insufficient information to code with codable children: Secondary | ICD-10-CM

## 2014-09-23 NOTE — Patient Instructions (Signed)
Plan: Contact Dr. Buddy Duty to inform him of the decrease in your Levemir insulin from 14 units twice daily to only 7 units at night per Hospitalist this past week and let him know your BG has been in the 300's until you gave yourself 16 units of Novolog last night to bring it down. Consider identifying your foods by food group and do some basic carb counting to practice  Think about the advantages we discussed regarding pump therapy and let me know if you have any questions If you decide to pursue the pump, I will offer you to try and practice with saline water in it first so you can practice before starting with insulin Continue taking your BG at least 4 times a day Let me know if you have any questions or concerns.

## 2014-09-23 NOTE — Progress Notes (Signed)
Diabetes Self-Management Education  Visit Type:  Initial  Appt. Start Time: 0930 Appt. End Time: 1100  09/23/2014  [name], identified by name and date of birth, is a 64 y.o. female with a diagnosis of Diabetes: Type 1 (When I questioned her about learning more about insulin pump she and her husband stated they did not want one. They explained they were intimidated by the idea and did not feel it would help, but did agree to let me explain and show them one today.).  Other people present during visit:  Patient, Spouse/SO   ASSESSMENT  There were no vitals taken for this visit. There is no weight on file to calculate BMI.  Initial Visit Information:  Are you currently following a meal plan?: No              Psychosocial:     Self-management support: Family, Doctor's office Other persons present: Patient, Spouse/SO Patient Concerns: Nutrition/Meal planning, Medication (Insulin pump has been recommended, she is intimidated by the technology and states she doesn't  want one after all) Special Needs: Simplified materials Learning Readiness: Other (comment) (easily overwhelmed)  Complications:   Last HgB A1C per patient/outside source: 11.3 % How often do you check your blood sugar?: 3-4 times/day Postprandial Blood glucose range (mg/dL): >200 Number of hypoglycemic episodes per month: 8  Diet Intake:  Breakfast: 8 oz fruit juice, then goes out for an egg sandwich, coffee black Snack (morning): no Lunch: left overs OR sandwich, unsweet tea Snack (afternoon): just if low BG Dinner: eat out often: meat, green vegetable, starch, occasionally cole slaw, unsweet tea or diet soda Snack (evening): just if low BG Beverage(s): black coffee, unsweet tea or diet soda, lots of water   Exercise:  Exercise: ADL's (very limited exercise)  Individualized Plan for Diabetes Self-Management Training:   Learning Objective:  Patient will have a greater understanding of  diabetes self-management.  Patient education plan per assessed needs and concerns is to attend individual sessions for     Education Topics Reviewed with Patient Today:  Explored patient's options for treatment of their diabetes Food label reading, portion sizes and measuring food., Carbohydrate counting, Other (comment) (Discussed potential opportunity of Intensive Insulin Therapy where she could be offered a Carb Ratio to adjust meal time insulin to actual carb intake)   Other (comment) (reviewed insulin action of Levemir and Novolog. Discussed relationship to transition to insulin pump) Identified appropriate SMBG and/or A1C goals.     Role of stress on diabetes      PATIENTS GOALS/Plan (Developed by the patient):  Nutrition: Follow meal plan discussed, Other (comment) (Patient and husband agree to identify foods by food group to increase comfort level with carb counting.) Physical Activity: Not Applicable Medications:  (Completed Intro to Pumping topics for her to consider ) Monitoring : test blood glucose pre and post meals as discussed  Plan:   Patient Instructions  Plan: Contact Dr. Buddy Duty to inform him of the decrease in your Levemir insulin from 14 units twice daily to only 7 units at night per Hospitalist this past week and let him know your BG has been in the 300's until you gave yourself 16 units of Novolog last night to bring it down. Consider identifying your foods by food group and do some basic carb counting to practice  Think about the advantages we discussed regarding pump therapy and let me know if you have any questions If you decide to pursue the pump, I will offer  you to try and practice with saline water in it first so you can practice before starting with insulin Continue taking your BG at least 4 times a day Let me know if you have any questions or concerns.    Expected Outcomes:  Demonstrated limited interest in learning.  Expect minimal changes, Other  (comment) (Offered that if she considered pump therapy we would take the training slowly and I could do a Saline Start so she could practice for a few days without worrying about making insulin dose decisions. )  Education material provided: Living Well with Diabetes, Meal plan card and Carbohydrate counting sheet, Intro to Pumping handout  If problems or questions, patient to contact team via:  Phone and Email  Future DSME appointment: PRN patient to call for appointment when she is ready to continue our conversation.

## 2014-09-30 ENCOUNTER — Ambulatory Visit: Payer: BC Managed Care – PPO | Admitting: Nurse Practitioner

## 2014-10-08 ENCOUNTER — Encounter (HOSPITAL_COMMUNITY): Payer: Self-pay | Admitting: Emergency Medicine

## 2014-10-08 ENCOUNTER — Inpatient Hospital Stay (HOSPITAL_COMMUNITY)
Admission: EM | Admit: 2014-10-08 | Discharge: 2014-10-09 | DRG: 638 | Disposition: A | Discharge status: Home / Self Care | Payer: BC Managed Care – PPO | Attending: Internal Medicine | Admitting: Internal Medicine

## 2014-10-08 DIAGNOSIS — E1049 Type 1 diabetes mellitus with other diabetic neurological complication: Secondary | ICD-10-CM | POA: Diagnosis present

## 2014-10-08 DIAGNOSIS — E039 Hypothyroidism, unspecified: Secondary | ICD-10-CM | POA: Diagnosis present

## 2014-10-08 DIAGNOSIS — K3184 Gastroparesis: Secondary | ICD-10-CM | POA: Diagnosis present

## 2014-10-08 DIAGNOSIS — Z79899 Other long term (current) drug therapy: Secondary | ICD-10-CM

## 2014-10-08 DIAGNOSIS — Z951 Presence of aortocoronary bypass graft: Secondary | ICD-10-CM | POA: Diagnosis not present

## 2014-10-08 DIAGNOSIS — Z8249 Family history of ischemic heart disease and other diseases of the circulatory system: Secondary | ICD-10-CM | POA: Diagnosis not present

## 2014-10-08 DIAGNOSIS — I129 Hypertensive chronic kidney disease with stage 1 through stage 4 chronic kidney disease, or unspecified chronic kidney disease: Secondary | ICD-10-CM | POA: Diagnosis present

## 2014-10-08 DIAGNOSIS — E10649 Type 1 diabetes mellitus with hypoglycemia without coma: Secondary | ICD-10-CM | POA: Diagnosis present

## 2014-10-08 DIAGNOSIS — E785 Hyperlipidemia, unspecified: Secondary | ICD-10-CM | POA: Diagnosis present

## 2014-10-08 DIAGNOSIS — N183 Chronic kidney disease, stage 3 (moderate): Secondary | ICD-10-CM | POA: Diagnosis present

## 2014-10-08 DIAGNOSIS — Z820 Family history of epilepsy and other diseases of the nervous system: Secondary | ICD-10-CM | POA: Diagnosis not present

## 2014-10-08 DIAGNOSIS — Z794 Long term (current) use of insulin: Secondary | ICD-10-CM | POA: Diagnosis not present

## 2014-10-08 DIAGNOSIS — Z833 Family history of diabetes mellitus: Secondary | ICD-10-CM

## 2014-10-08 DIAGNOSIS — K219 Gastro-esophageal reflux disease without esophagitis: Secondary | ICD-10-CM | POA: Diagnosis present

## 2014-10-08 DIAGNOSIS — E138 Other specified diabetes mellitus with unspecified complications: Secondary | ICD-10-CM

## 2014-10-08 DIAGNOSIS — F419 Anxiety disorder, unspecified: Secondary | ICD-10-CM | POA: Diagnosis present

## 2014-10-08 DIAGNOSIS — F329 Major depressive disorder, single episode, unspecified: Secondary | ICD-10-CM | POA: Diagnosis present

## 2014-10-08 DIAGNOSIS — Z881 Allergy status to other antibiotic agents status: Secondary | ICD-10-CM

## 2014-10-08 DIAGNOSIS — I252 Old myocardial infarction: Secondary | ICD-10-CM | POA: Diagnosis not present

## 2014-10-08 DIAGNOSIS — E162 Hypoglycemia, unspecified: Secondary | ICD-10-CM | POA: Diagnosis present

## 2014-10-08 DIAGNOSIS — N184 Chronic kidney disease, stage 4 (severe): Secondary | ICD-10-CM | POA: Diagnosis present

## 2014-10-08 DIAGNOSIS — I5042 Chronic combined systolic (congestive) and diastolic (congestive) heart failure: Secondary | ICD-10-CM | POA: Diagnosis present

## 2014-10-08 DIAGNOSIS — M1990 Unspecified osteoarthritis, unspecified site: Secondary | ICD-10-CM | POA: Diagnosis present

## 2014-10-08 DIAGNOSIS — E1041 Type 1 diabetes mellitus with diabetic mononeuropathy: Secondary | ICD-10-CM

## 2014-10-08 DIAGNOSIS — I2510 Atherosclerotic heart disease of native coronary artery without angina pectoris: Secondary | ICD-10-CM | POA: Diagnosis present

## 2014-10-08 DIAGNOSIS — M810 Age-related osteoporosis without current pathological fracture: Secondary | ICD-10-CM | POA: Diagnosis present

## 2014-10-08 DIAGNOSIS — IMO0002 Reserved for concepts with insufficient information to code with codable children: Secondary | ICD-10-CM | POA: Diagnosis present

## 2014-10-08 DIAGNOSIS — Z7982 Long term (current) use of aspirin: Secondary | ICD-10-CM | POA: Diagnosis not present

## 2014-10-08 DIAGNOSIS — E860 Dehydration: Secondary | ICD-10-CM | POA: Diagnosis present

## 2014-10-08 DIAGNOSIS — E1065 Type 1 diabetes mellitus with hyperglycemia: Secondary | ICD-10-CM

## 2014-10-08 LAB — BASIC METABOLIC PANEL
ANION GAP: 12 (ref 5–15)
BUN: 24 mg/dL — ABNORMAL HIGH (ref 6–23)
CALCIUM: 9.2 mg/dL (ref 8.4–10.5)
CO2: 23 mmol/L (ref 19–32)
CREATININE: 0.98 mg/dL (ref 0.50–1.10)
Chloride: 103 mmol/L (ref 96–112)
GFR calc Af Amer: 69 mL/min — ABNORMAL LOW (ref >90)
GFR, EST NON AFRICAN AMERICAN: 60 mL/min — AB (ref >90)
Glucose, Bld: 55 mg/dL — ABNORMAL LOW (ref 70–99)
POTASSIUM: 3.6 mmol/L (ref 3.5–5.1)
SODIUM: 138 mmol/L (ref 135–145)

## 2014-10-08 LAB — CBC
HCT: 36.6 % (ref 36.0–46.0)
Hemoglobin: 12.4 g/dL (ref 12.0–15.0)
MCH: 30.2 pg (ref 26.0–34.0)
MCHC: 33.9 g/dL (ref 30.0–36.0)
MCV: 89.3 fL (ref 78.0–100.0)
PLATELETS: 309 10*3/uL (ref 150–400)
RBC: 4.1 MIL/uL (ref 3.87–5.11)
RDW: 14.6 % (ref 11.5–15.5)
WBC: 5.9 10*3/uL (ref 4.0–10.5)

## 2014-10-08 LAB — GLUCOSE, CAPILLARY
GLUCOSE-CAPILLARY: 532 mg/dL — AB (ref 70–99)
Glucose-Capillary: 211 mg/dL — ABNORMAL HIGH (ref 70–99)

## 2014-10-08 LAB — GLUCOSE, RANDOM: Glucose, Bld: 510 mg/dL — ABNORMAL HIGH (ref 70–99)

## 2014-10-08 LAB — CBG MONITORING, ED
Glucose-Capillary: 92 mg/dL (ref 70–99)
Glucose-Capillary: 73 mg/dL (ref 70–99)

## 2014-10-08 MED ORDER — HYOSCYAMINE SULFATE 0.125 MG PO TABS
0.1250 mg | ORAL_TABLET | ORAL | Status: DC | PRN
  Filled 2014-10-08: qty 1

## 2014-10-08 MED ORDER — INSULIN ASPART 100 UNIT/ML ~~LOC~~ SOLN
10.0000 [IU] | Freq: Once | SUBCUTANEOUS | Status: AC
  Administered 2014-10-08: 10 [IU] via SUBCUTANEOUS

## 2014-10-08 MED ORDER — CLOPIDOGREL BISULFATE 75 MG PO TABS
75.0000 mg | ORAL_TABLET | Freq: Every day | ORAL | Status: DC
  Administered 2014-10-09: 75 mg via ORAL
  Filled 2014-10-08 (×2): qty 1

## 2014-10-08 MED ORDER — SODIUM CHLORIDE 0.9 % IV SOLN: INTRAVENOUS | Status: DC

## 2014-10-08 MED ORDER — ONDANSETRON HCL 4 MG/2ML IJ SOLN: 4.0000 mg | Freq: Three times a day (TID) | INTRAMUSCULAR | Status: AC | PRN

## 2014-10-08 MED ORDER — ESCITALOPRAM OXALATE 10 MG PO TABS
10.0000 mg | ORAL_TABLET | Freq: Every day | ORAL | Status: DC
  Administered 2014-10-09: 10 mg via ORAL
  Filled 2014-10-08: qty 1

## 2014-10-08 MED ORDER — METOCLOPRAMIDE HCL 5 MG/ML IJ SOLN
10.0000 mg | Freq: Three times a day (TID) | INTRAMUSCULAR | Status: DC
  Administered 2014-10-08 – 2014-10-09 (×3): 10 mg via INTRAVENOUS
  Filled 2014-10-08 (×6): qty 2

## 2014-10-08 MED ORDER — ONDANSETRON HCL 4 MG/2ML IJ SOLN: 4.0000 mg | Freq: Three times a day (TID) | INTRAMUSCULAR | Status: DC | PRN

## 2014-10-08 MED ORDER — SODIUM CHLORIDE 0.9 % IV SOLN
INTRAVENOUS | Status: AC
  Administered 2014-10-09: 02:00:00 via INTRAVENOUS

## 2014-10-08 MED ORDER — ASPIRIN EC 325 MG PO TBEC
325.0000 mg | DELAYED_RELEASE_TABLET | Freq: Every day | ORAL | Status: DC
  Administered 2014-10-08 – 2014-10-09 (×2): 325 mg via ORAL
  Filled 2014-10-08 (×2): qty 1

## 2014-10-08 MED ORDER — ROSUVASTATIN CALCIUM 40 MG PO TABS
40.0000 mg | ORAL_TABLET | Freq: Every day | ORAL | Status: DC
  Administered 2014-10-09: 40 mg via ORAL
  Filled 2014-10-08: qty 1

## 2014-10-08 MED ORDER — ACETAMINOPHEN 325 MG PO TABS: 650.0000 mg | ORAL_TABLET | Freq: Four times a day (QID) | ORAL | Status: DC | PRN

## 2014-10-08 MED ORDER — INSULIN DETEMIR 100 UNIT/ML ~~LOC~~ SOLN
8.0000 [IU] | Freq: Two times a day (BID) | SUBCUTANEOUS | Status: DC
  Administered 2014-10-08 – 2014-10-09 (×2): 8 [IU] via SUBCUTANEOUS
  Filled 2014-10-08 (×3): qty 0.08

## 2014-10-08 MED ORDER — VITAMIN D (ERGOCALCIFEROL) 1.25 MG (50000 UNIT) PO CAPS
50000.0000 [IU] | ORAL_CAPSULE | ORAL | Status: DC
Start: 2014-10-15 — End: 2014-10-09

## 2014-10-08 MED ORDER — INSULIN ASPART 100 UNIT/ML ~~LOC~~ SOLN: 0.0000 [IU] | Freq: Three times a day (TID) | SUBCUTANEOUS | Status: DC

## 2014-10-08 MED ORDER — LEVOTHYROXINE SODIUM 125 MCG PO TABS
125.0000 ug | ORAL_TABLET | Freq: Every day | ORAL | Status: DC
  Administered 2014-10-09: 125 ug via ORAL
  Filled 2014-10-08 (×2): qty 1

## 2014-10-08 MED ORDER — ACETAMINOPHEN 650 MG RE SUPP: 650.0000 mg | Freq: Four times a day (QID) | RECTAL | Status: DC | PRN

## 2014-10-08 MED ORDER — ENOXAPARIN SODIUM 40 MG/0.4ML ~~LOC~~ SOLN
40.0000 mg | SUBCUTANEOUS | Status: DC
  Administered 2014-10-08: 40 mg via SUBCUTANEOUS
  Filled 2014-10-08 (×2): qty 0.4

## 2014-10-08 MED ORDER — METOCLOPRAMIDE HCL 10 MG PO TABS: 10.0000 mg | ORAL_TABLET | Freq: Three times a day (TID) | ORAL | Status: DC

## 2014-10-08 MED ORDER — CARVEDILOL 6.25 MG PO TABS
6.2500 mg | ORAL_TABLET | Freq: Two times a day (BID) | ORAL | Status: DC
  Administered 2014-10-08 – 2014-10-09 (×2): 6.25 mg via ORAL
  Filled 2014-10-08 (×4): qty 1

## 2014-10-08 MED ORDER — ONDANSETRON 4 MG PO TBDP: 8.0000 mg | ORAL_TABLET | Freq: Three times a day (TID) | ORAL | Status: DC | PRN

## 2014-10-08 MED ORDER — PANTOPRAZOLE SODIUM 40 MG PO TBEC
40.0000 mg | DELAYED_RELEASE_TABLET | Freq: Every day | ORAL | Status: DC
  Administered 2014-10-09: 40 mg via ORAL
  Filled 2014-10-08: qty 1

## 2014-10-08 MED ORDER — SODIUM CHLORIDE 0.9 % IV SOLN
INTRAVENOUS | Status: AC
  Administered 2014-10-08: 23:00:00 via INTRAVENOUS
  Administered 2014-10-08: 50 mL/h via INTRAVENOUS

## 2014-10-08 MED ORDER — INSULIN ASPART 100 UNIT/ML ~~LOC~~ SOLN
0.0000 [IU] | Freq: Every day | SUBCUTANEOUS | Status: DC
  Administered 2014-10-08: 5 [IU] via SUBCUTANEOUS

## 2014-10-08 NOTE — ED Notes (Signed)
CBG 92, alerted MD to pt condition. States she feels much better at this time. Brought pt a pillow and warm blanket.

## 2014-10-08 NOTE — ED Notes (Signed)
CBG 73. RN made aware.

## 2014-10-08 NOTE — ED Notes (Signed)
Pt recently been dx with gastroparesis, has not been eating much. Husband gave her night time insulin last night, then woke up this morning and gave 27 units of Novolog before breakfast. Pt did not eat, and began feeling dizzy, nauseous, pale, and sweaty. EMS called out, BG on arrival was 18, they gave 1 amp of D50 IV, 4 mg of Zofran, and BG came up to 241 in route. Prior to hospital arrival BG was 74. At this time BG 69, pt awake AxO, pale, diaphoretic.

## 2014-10-08 NOTE — ED Provider Notes (Signed)
CSN: [id number]     Arrival date & time 10/08/14  1201 History   First MD Initiated Contact with Patient 10/08/14 1504     Chief Complaint  Patient presents with  . Hypoglycemia      HPI Patient presents with hypoglycemia.  Patient has had severe gastroparesis with nausea and vomiting for the last 3 days.  According to her husband her glucose got to greater than 500 last night.  Paramedics were contacted became the house and spent approximately an hour and a half even her IV fluids and insulin until she finally got her sugar down.  This morning she felt better but then did not eat.  Her husband cut her insulin back but then she had an hypoglycemic episode became confused.  She was given D50 en route when she arrived here glucose was up and she was now awake and alert.  She continues to have nausea has eaten some peanut butter without vomiting. Past Medical History  Diagnosis Date  . Diabetes mellitus     on insulin, with h/o DKA   . HTN (hypertension)   . Hyperlipidemia   . Venous stasis ulcers   . Arthritis   . Depression     anxiety  . 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 Hypertension   . Coronary artery disease     angina.  MI.   . Pneumonia        . GERD (gastroesophageal reflux disease)     Hiatal hernia.   . Hypothyroidism   . CHF (congestive heart failure)   . Tuberculosis   . Osteoporosis    Past Surgical History  Procedure Laterality Date  . Cardiac catheterization    . Tonsillectomy    . Coronary artery bypass graft      Dr. Prescott Gum in 09/2011   . Coronary artery bypass graft  2012  . Esophagogastroduodenoscopy N/A 11/17/2013    Procedure: ESOPHAGOGASTRODUODENOSCOPY (EGD);  Surgeon: Lafayette Dragon, MD;  Location: [facility] ENDOSCOPY;  Service: Endoscopy;  Laterality: N/A;  . Colonoscopy N/A 11/18/2013    Procedure: COLONOSCOPY;  Surgeon: Lafayette Dragon, MD;  Location: [facility] ENDOSCOPY;  Service: Endoscopy;  Laterality: N/A;  .  Right heart catheterization N/A 07/29/2011    Procedure: RIGHT HEART CATH;  Surgeon: Minus Breeding, MD;  Location: [facility] CATH LAB;  Service: Cardiovascular;  Laterality: N/A;  . Left heart catheterization with coronary angiogram N/A 07/26/2011    Procedure: LEFT HEART CATHETERIZATION WITH CORONARY ANGIOGRAM;  Surgeon: Larey Dresser, MD;  Location: [facility] CATH LAB;  Service: Cardiovascular;  Laterality: N/A;   Family History  Problem Relation Age of Onset  . Heart disease Father   . Multiple sclerosis Father   . Hypertension Mother   . Hyperthyroidism Mother   . Diabetes Cousin     Multiple maternal cousins with type 2 diabetes mellitus  . Diabetes Maternal Uncle     Type 1 diabetes mellitus  . Heart attack Paternal Grandfather   . Heart disease Paternal Grandfather    History  Substance Use Topics  . Smoking status: Never Smoker   . Smokeless tobacco: Not on file  . Alcohol Use: No   OB History    No data available     Review of Systems  Gastrointestinal: Negative for abdominal pain, constipation and abdominal distention.  All other systems reviewed and are negative.     Allergies  Sulfa antibiotics  Home Medications  Prior to Admission medications   Medication Sig Start Date End Date Taking? Authorizing Provider  alendronate (FOSAMAX) 70 MG tablet Take 70 mg by mouth every Saturday.  11/04/13  Yes Historical Provider, MD  aspirin EC 81 MG tablet Take 1 tablet (81 mg total) by mouth daily. 06/15/14  Yes Rande Brunt, NP  carvedilol (COREG) 6.25 MG tablet Take 1 tablet (6.25 mg total) by mouth 2 (two) times daily with a meal. Patient taking differently: Take 12.5 mg by mouth 2 (two) times daily with a meal.  10/29/13  Yes Melton Alar, PA-C  clopidogrel (PLAVIX) 75 MG tablet Take 1 tablet (75 mg total) by mouth daily with breakfast. 01/12/14  Yes Lysbeth Penner, FNP  escitalopram (LEXAPRO) 10 MG tablet Take 1 tablet (10 mg total) by mouth daily. 09/27/13  Yes Lysbeth Penner, FNP  furosemide (LASIX) 40 MG tablet Take 1 tablet (40 mg total) by mouth daily. 10/29/13  Yes Bobby Rumpf York, PA-C  hyoscyamine (LEVSIN, ANASPAZ) 0.125 MG tablet Take 0.125 mg by mouth every 4 (four) hours as needed for bladder spasms.   Yes Historical Provider, MD  insulin aspart (NOVOLOG) 100 UNIT/ML injection Inject 3 Units into the skin 3 (three) times daily with meals. Patient taking differently: Inject 9-20 Units into the skin 3 (three) times daily with meals. On sliding scale 09/21/14  Yes Nishant Dhungel, MD  insulin detemir (LEVEMIR) 100 UNIT/ML injection Inject 0.07 mLs (7 Units total) into the skin at bedtime. Patient taking differently: Inject 14 Units into the skin 2 (two) times daily.  09/21/14  Yes Nishant Dhungel, MD  levothyroxine (SYNTHROID, LEVOTHROID) 125 MCG tablet Take 1 tablet (125 mcg total) by mouth daily. 09/27/13  Yes Lysbeth Penner, FNP  metoCLOPramide (REGLAN) 10 MG tablet Take 1 tablet (10 mg total) by mouth 4 (four) times daily -  before meals and at bedtime. 11/19/13  Yes Kelvin Cellar, MD  ondansetron (ZOFRAN ODT) 8 MG disintegrating tablet Take 1 tablet (8 mg total) by mouth every 8 (eight) hours as needed for nausea or vomiting. 11/10/13  Yes Lysbeth Penner, FNP  pantoprazole (PROTONIX) 40 MG tablet Take 1 tablet (40 mg total) by mouth daily. 07/31/14 03/30/17 Yes Ripudeep Krystal Eaton, MD  rosuvastatin (CRESTOR) 40 MG tablet Take 1 tablet (40 mg total) by mouth daily. 09/27/13  Yes Lysbeth Penner, FNP  spironolactone (ALDACTONE) 25 MG tablet Take 12.5 mg by mouth daily.   Yes Historical Provider, MD  Vitamin D, Ergocalciferol, (DRISDOL) 50000 UNITS CAPS capsule Take 1 capsule by mouth every Saturday.  09/29/13  Yes Historical Provider, MD   BP 128/65 mmHg  Pulse 82  Temp(Src) 97.7 F (36.5 C) (Oral)  Resp 18  SpO2 99% Physical Exam  Constitutional: She is oriented to person, place, and time. She appears well-developed and well-nourished. No distress.  HENT:   Head: Normocephalic and atraumatic.  Eyes: Pupils are equal, round, and reactive to light.  Neck: Normal range of motion.  Cardiovascular: Normal rate and intact distal pulses.   Pulmonary/Chest: No respiratory distress.  Abdominal: Normal appearance. She exhibits no distension. There is no tenderness. There is no rebound.  Musculoskeletal: Normal range of motion.  Neurological: She is alert and oriented to person, place, and time. No cranial nerve deficit.  Skin: Skin is warm and dry. No rash noted.  Psychiatric: She has a normal mood and affect. Her behavior is normal.  Nursing note and vitals reviewed.   ED Course  Procedures (including critical care time)  Medications - No data to display  Labs Review Labs Reviewed  BASIC METABOLIC PANEL - Abnormal; Notable for the following:    Glucose, Bld 55 (*)    BUN 24 (*)    GFR calc non Af Amer 60 (*)    GFR calc Af Amer 69 (*)    All other components within normal limits  CBC  CBG MONITORING, ED  CBG MONITORING, ED    Imaging Review No results found.    MDM   Final diagnoses:  Hypoglycemia  Other specified diabetes mellitus with unspecified complications        Dot Lanes, MD 10/08/14 [phone]

## 2014-10-08 NOTE — ED Notes (Addendum)
CBG   69  On arrival  Pt awake, alert and oriented, gave peanut butter crackers and a sprite. Pt eating, states she feels much better now.

## 2014-10-08 NOTE — H&P (Signed)
[facility] History and Physical  Bridget Martinez [id number] DOB: [birth year] DOA: 10/08/2014   PCP: [name], MD    Chief Complaint: hypoglycemia   HPI: Bridget Martinez is a 64 y.o. female with PMH of DM, HTN, CAD who presents for hypoglycemia. She states that her sugar was greater than 600 last night and she received 26 U of Novolog at 9:30. By 1 AM her sugar was 16 and EMS was called. She was given Dextrose and ate something bringing her sugar up to normal. In the morning her sugar was high again in the 500s. She was given 27 U of Novolog at this time and in a matter of hours, the sugar had dropped to 16 again. At this point she was brought to the hospital. The patient has gastroparesis and had run out of her Reglan which is why she had not been eating and drinking appropriately for the past few days. This resulted in severe fluctuations in her sugars.    General: The patient denies anorexia, fever, weight loss Cardiac: Denies chest pain, syncope, palpitations, pedal edema  Respiratory: Denies cough, shortness of breath, wheezing GI: Denies severe indigestion/heartburn, abdominal pain, diarrhea and constipation--+ nausea, + vomiting GU: Denies hematuria, incontinence, dysuria  Musculoskeletal: Denies arthritis  Skin: Denies suspicious skin lesions Neurologic: Denies focal weakness or numbness, change in vision Psychiatry: Denies depression or anxiety. Hematologic: no easy bruising or bleeding   Past Medical History  Diagnosis Date  . Diabetes mellitus     on insulin, with h/o DKA   . HTN (hypertension)   . Hyperlipidemia   . Venous stasis ulcers   . Arthritis   . Depression     anxiety  . 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 Hypertension   . Coronary artery disease     angina.  MI.   . Pneumonia        . GERD (gastroesophageal reflux disease)     Hiatal hernia.   . Hypothyroidism   . CHF (congestive heart  failure)   . Tuberculosis   . Osteoporosis     Past Surgical History  Procedure Laterality Date  . Cardiac catheterization    . Tonsillectomy    . Coronary artery bypass graft      Dr. Prescott Gum in 09/2011   . Coronary artery bypass graft  2012  . Esophagogastroduodenoscopy N/A 11/17/2013    Procedure: ESOPHAGOGASTRODUODENOSCOPY (EGD);  Surgeon: Lafayette Dragon, MD;  Location: [facility] ENDOSCOPY;  Service: Endoscopy;  Laterality: N/A;  . Colonoscopy N/A 11/18/2013    Procedure: COLONOSCOPY;  Surgeon: Lafayette Dragon, MD;  Location: [facility] ENDOSCOPY;  Service: Endoscopy;  Laterality: N/A;  . Right heart catheterization N/A 07/29/2011    Procedure: RIGHT HEART CATH;  Surgeon: Minus Breeding, MD;  Location: [facility] CATH LAB;  Service: Cardiovascular;  Laterality: N/A;  . Left heart catheterization with coronary angiogram N/A 07/26/2011    Procedure: LEFT HEART CATHETERIZATION WITH CORONARY ANGIOGRAM;  Surgeon: Larey Dresser, MD;  Location: [facility] CATH LAB;  Service: Cardiovascular;  Laterality: N/A;    Social History: does not smoke or drink alcohol Lives at home with husband    Allergies  Allergen Reactions  . Sulfa Antibiotics Anaphylaxis and Swelling    Stiff neck also     Family History  Problem Relation Age of Onset  . Heart disease Father   . Multiple sclerosis Father   . Hypertension Mother   .  Hyperthyroidism Mother   . Diabetes Cousin     Multiple maternal cousins with type 2 diabetes mellitus  . Diabetes Maternal Uncle     Type 1 diabetes mellitus  . Heart attack Paternal Grandfather   . Heart disease Paternal Grandfather      Prior to Admission medications   Medication Sig Start Date End Date Taking? Authorizing Provider  alendronate (FOSAMAX) 70 MG tablet Take 70 mg by mouth every Saturday.  11/04/13  Yes Historical Provider, MD  aspirin EC 81 MG tablet Take 1 tablet (81 mg total) by mouth daily. 06/15/14  Yes Rande Brunt, NP  carvedilol (COREG) 6.25 MG tablet Take 1 tablet (6.25  mg total) by mouth 2 (two) times daily with a meal. Patient taking differently: Take 12.5 mg by mouth 2 (two) times daily with a meal.  10/29/13  Yes Melton Alar, PA-C  clopidogrel (PLAVIX) 75 MG tablet Take 1 tablet (75 mg total) by mouth daily with breakfast. 01/12/14  Yes Lysbeth Penner, FNP  escitalopram (LEXAPRO) 10 MG tablet Take 1 tablet (10 mg total) by mouth daily. 09/27/13  Yes Lysbeth Penner, FNP  furosemide (LASIX) 40 MG tablet Take 1 tablet (40 mg total) by mouth daily. 10/29/13  Yes Bobby Rumpf York, PA-C  hyoscyamine (LEVSIN, ANASPAZ) 0.125 MG tablet Take 0.125 mg by mouth every 4 (four) hours as needed for bladder spasms.   Yes Historical Provider, MD  insulin aspart (NOVOLOG) 100 UNIT/ML injection Inject 3 Units into the skin 3 (three) times daily with meals. Patient taking differently: Inject 9-20 Units into the skin 3 (three) times daily with meals. On sliding scale 09/21/14  Yes Nishant Dhungel, MD  insulin detemir (LEVEMIR) 100 UNIT/ML injection Inject 0.07 mLs (7 Units total) into the skin at bedtime. Patient taking differently: Inject 14 Units into the skin 2 (two) times daily.  09/21/14  Yes Nishant Dhungel, MD  levothyroxine (SYNTHROID, LEVOTHROID) 125 MCG tablet Take 1 tablet (125 mcg total) by mouth daily. 09/27/13  Yes Lysbeth Penner, FNP  metoCLOPramide (REGLAN) 10 MG tablet Take 1 tablet (10 mg total) by mouth 4 (four) times daily -  before meals and at bedtime. 11/19/13  Yes Kelvin Cellar, MD  ondansetron (ZOFRAN ODT) 8 MG disintegrating tablet Take 1 tablet (8 mg total) by mouth every 8 (eight) hours as needed for nausea or vomiting. 11/10/13  Yes Lysbeth Penner, FNP  pantoprazole (PROTONIX) 40 MG tablet Take 1 tablet (40 mg total) by mouth daily. 07/31/14 03/30/17 Yes Ripudeep Krystal Eaton, MD  rosuvastatin (CRESTOR) 40 MG tablet Take 1 tablet (40 mg total) by mouth daily. 09/27/13  Yes Lysbeth Penner, FNP  spironolactone (ALDACTONE) 25 MG tablet Take 12.5 mg by mouth  daily.   Yes Historical Provider, MD  Vitamin D, Ergocalciferol, (DRISDOL) 50000 UNITS CAPS capsule Take 1 capsule by mouth every Saturday.  09/29/13  Yes Historical Provider, MD     Physical Exam: Filed Vitals:   10/08/14 1202 10/08/14 1348 10/08/14 1634  BP: 133/66 128/65 123/58  Pulse: 71 82 84  Temp: 97.7 F (36.5 C)    TempSrc: Oral    Resp: 18 18 18   SpO2: 100% 99% 97%     General: AAO 3, no acute distress HEENT: Normocephalic and Atraumatic, Mucous membranes pink                PERRLA; EOM intact; No scleral icterus,  Nares: Patent, Oropharynx: Clear, Fair Dentition                 Neck: FROM, no cervical lymphadenopathy, thyromegaly, carotid bruit or JVD;  Breasts: deferred CHEST WALL: No tenderness  CHEST: Normal respiration, clear to auscultation bilaterally  HEART: Regular rate and rhythm; no murmurs rubs or gallops  BACK: No kyphosis or scoliosis; no CVA tenderness  GI: Positive Bowel Sounds, soft, non-tender; no masses, no organomegaly Rectal Exam: deferred MSK: No cyanosis, clubbing, or edema Genitalia: not examined  SKIN:  no rash or ulceration  CNS: Alert and Oriented x 4, Nonfocal exam, CN 2-12 intact  Labs on Admission:  Basic Metabolic Panel:  Recent Labs Lab 10/08/14 1243  NA 138  K 3.6  CL 103  CO2 23  GLUCOSE 55*  BUN 24*  CREATININE 0.98  CALCIUM 9.2   Liver Function Tests: No results for input(s): AST, ALT, ALKPHOS, BILITOT, PROT, ALBUMIN in the last 168 hours. No results for input(s): LIPASE, AMYLASE in the last 168 hours. No results for input(s): AMMONIA in the last 168 hours. CBC:  Recent Labs Lab 10/08/14 1243  WBC 5.9  HGB 12.4  HCT 36.6  MCV 89.3  PLT 309   Cardiac Enzymes: No results for input(s): CKTOTAL, CKMB, CKMBINDEX, TROPONINI in the last 168 hours.  BNP (last 3 results)  Recent Labs  11/14/13 0405 06/15/14 1035 07/26/14 1800  PROBNP 1470.0* 650.1* 2156.0*   CBG:  Recent Labs Lab  10/08/14 1324 10/08/14 1559  GLUCAP 92 73    Radiological Exams on Admission: No results found.   Assessment/Plan Principal Problem:   Hypoglycemia--  DM (diabetes mellitus), type 1, uncontrolled w/neurologic complication - in setting of administering insulin while not being able to eat - cont to follow sugars- will only place on a low dose sliding scale- insulin will be given after she is done with her meal - it will not be given if she has not eaten -will only give her half dose of Levemir tonight and tomorrow morning.   Active Problems:   Dehydration in setting of CKD (chronic kidney disease), stage III - will start slow IVF for 12 hrs only and hold diuretics      Chronic combined systolic and diastolic congestive heart failure - hold diuretics for now    Consulted: none  Code Status: Full code Family Communication: husband at bedside  DVT Prophylaxis:Lovenox  Time spent: 54 min  [place], MD [facility]  If 7PM-7AM, please contact night-coverage [email or web address] 10/08/2014, 5:38 PM

## 2014-10-08 NOTE — ED Notes (Signed)
Bed: WA20 Expected date: 10/08/14 Expected time: 11:37 AM Means of arrival: Ambulance Comments: Hypoglycemia 40 at 0400 husband gave insulin on arrival 18 treated

## 2014-10-09 ENCOUNTER — Encounter (HOSPITAL_COMMUNITY): Payer: Self-pay | Admitting: *Deleted

## 2014-10-09 LAB — BASIC METABOLIC PANEL
Anion gap: 11 (ref 5–15)
BUN: 21 mg/dL (ref 6–23)
CHLORIDE: 105 mmol/L (ref 96–112)
CO2: 24 mmol/L (ref 19–32)
CREATININE: 1.04 mg/dL (ref 0.50–1.10)
Calcium: 9 mg/dL (ref 8.4–10.5)
GFR calc non Af Amer: 56 mL/min — ABNORMAL LOW (ref >90)
GFR, EST AFRICAN AMERICAN: 64 mL/min — AB (ref >90)
Glucose, Bld: 55 mg/dL — ABNORMAL LOW (ref 70–99)
Potassium: 3.4 mmol/L — ABNORMAL LOW (ref 3.5–5.1)
Sodium: 140 mmol/L (ref 135–145)

## 2014-10-09 LAB — GLUCOSE, CAPILLARY
GLUCOSE-CAPILLARY: 58 mg/dL — AB (ref 70–99)
GLUCOSE-CAPILLARY: 75 mg/dL (ref 70–99)
GLUCOSE-CAPILLARY: 76 mg/dL (ref 70–99)
GLUCOSE-CAPILLARY: 71 mg/dL (ref 70–99)
Glucose-Capillary: 53 mg/dL — ABNORMAL LOW (ref 70–99)
Glucose-Capillary: 75 mg/dL (ref 70–99)

## 2014-10-09 MED ORDER — ONDANSETRON 8 MG PO TBDP: 8.0000 mg | ORAL_TABLET | Freq: Three times a day (TID) | ORAL | Status: DC | PRN

## 2014-10-09 MED ORDER — METOCLOPRAMIDE HCL 10 MG PO TABS
10.0000 mg | ORAL_TABLET | Freq: Once | ORAL | Status: AC
  Administered 2014-10-09: 10 mg via ORAL
  Filled 2014-10-09: qty 1

## 2014-10-09 MED ORDER — METOCLOPRAMIDE HCL 10 MG PO TABS: 10.0000 mg | ORAL_TABLET | Freq: Three times a day (TID) | ORAL | Status: DC

## 2014-10-09 NOTE — Discharge Summary (Signed)
Spoke with Dr. Wynelle Cleveland to clarify d/c instructions.  Dr. Wynelle Cleveland said that pt to resume Novolog and Levemir as prescribed by pt's PCP, Dr. Buddy Duty. Reviewed d/c instructions including medications, follow-up appointments, and heart failure specifics to reinforce prior teaching.  Pt able to teach back planned insulin regimen, except did for not having her sliding scale chart that she is at home.  Pt being d/c to home into care of spouse with assistance from daughter.

## 2014-10-09 NOTE — Discharge Summary (Signed)
Physician Discharge Summary  Bridget Martinez [id number] DOB: Jul 06, 1951 DOA: 10/08/2014  PCP: [name], MD  Admit date: 10/08/2014 Discharge date: 10/09/2014  Time spent: 45 minutes     Discharge Condition: stable Diet recommendation: heart healthy, diabetic  Discharge Diagnoses:  Principal Problem:   Hypoglycemia Active Problems:   CKD (chronic kidney disease), stage III   DM (diabetes mellitus), type 1, uncontrolled w/neurologic complication   Chronic combined systolic and diastolic congestive heart failure   History of present illness:  Bridget Martinez is a 63 y.o. female with PMH of DM, HTN, CAD who presents for hypoglycemia. She states that her sugar was greater than 600 last night and she received 26 U of Novolog at 9:30. By 1 AM her sugar was 16 and EMS was called. She was given Dextrose and ate something bringing her sugar up to normal. In the morning her sugar was high again in the 500s. She was given 27 U of Novolog at this time and in a matter of hours, the sugar had dropped to 16 again. At this point she was brought to the hospital. The patient has gastroparesis and had run out of her Reglan which is why she had not been eating and drinking appropriately for the past few days. This resulted in severe fluctuations in her sugars.   Hospital Course:  Principal Problem:  Hypoglycemia-- DM (diabetes mellitus), type 1, uncontrolled w/neurologic complication - in setting of administering insulin while not being able to eat due to gastroparesis - sugars now stable- patient eating her meals without trouble  Active Problems: Gastroparesis - exacerbation due to running out of Reglan- she is now eating again after resuming Reglan - will give her a new prescription.    Dehydration in setting of CKD (chronic kidney disease), stage III - hydrated with -  diuretics held for today- she has been advised to resume Diuretics tomorrow if she continues to eat and drink  properly   Chronic combined systolic and diastolic congestive heart failure - hold diuretics for now- see above  Procedures:   Consultations:  Discharge Exam: Filed Weights   10/08/14 1800  Weight: 65.862 kg (145 lb 3.2 oz)   Filed Vitals:   10/09/14 0530  BP: 106/47  Pulse: 79  Temp: 98.1 F (36.7 C)  Resp: 18    General: AAO x 3, no distress Cardiovascular: RRR, no murmurs  Respiratory: clear to auscultation bilaterally GI: soft, non-tender, non-distended, bowel sound positive  Discharge Instructions You were cared for by a hospitalist during your hospital stay. If you have any questions about your discharge medications or the care you received while you were in the hospital after you are discharged, you can call the unit and asked to speak with the hospitalist on call if the hospitalist that took care of you is not available. Once you are discharged, your primary care physician will handle any further medical issues. Please note that NO REFILLS for any discharge medications will be authorized once you are discharged, as it is imperative that you return to your primary care physician (or establish a relationship with a primary care physician if you do not have one) for your aftercare needs so that they can reassess your need for medications and monitor your lab values.      Discharge Instructions    Diet - low sodium heart healthy    Complete by:  As directed   And diabetic diet     Increase activity slowly  Complete by:  As directed             Medication List    TAKE these medications        alendronate 70 MG tablet  Commonly known as:  FOSAMAX  Take 70 mg by mouth every Saturday.     aspirin EC 81 MG tablet  Take 1 tablet (81 mg total) by mouth daily.     carvedilol 6.25 MG tablet  Commonly known as:  COREG  Take 1 tablet (6.25 mg total) by mouth 2 (two) times daily with a meal.     clopidogrel 75 MG tablet  Commonly known as:  PLAVIX  Take 1  tablet (75 mg total) by mouth daily with breakfast.     escitalopram 10 MG tablet  Commonly known as:  LEXAPRO  Take 1 tablet (10 mg total) by mouth daily.     furosemide 40 MG tablet  Commonly known as:  LASIX  Take 1 tablet (40 mg total) by mouth daily.     hyoscyamine 0.125 MG tablet  Commonly known as:  LEVSIN, ANASPAZ  Take 0.125 mg by mouth every 4 (four) hours as needed for bladder spasms.     insulin aspart 100 UNIT/ML injection  Commonly known as:  novoLOG  Inject 3 Units into the skin 3 (three) times daily with meals.     insulin detemir 100 UNIT/ML injection  Commonly known as:  LEVEMIR  Inject 0.07 mLs (7 Units total) into the skin at bedtime.     levothyroxine 125 MCG tablet  Commonly known as:  SYNTHROID, LEVOTHROID  Take 1 tablet (125 mcg total) by mouth daily.     metoCLOPramide 10 MG tablet  Commonly known as:  REGLAN  Take 1 tablet (10 mg total) by mouth 4 (four) times daily -  before meals and at bedtime.     ondansetron 8 MG disintegrating tablet  Commonly known as:  ZOFRAN ODT  Take 1 tablet (8 mg total) by mouth every 8 (eight) hours as needed for nausea or vomiting.     pantoprazole 40 MG tablet  Commonly known as:  PROTONIX  Take 1 tablet (40 mg total) by mouth daily.     rosuvastatin 40 MG tablet  Commonly known as:  CRESTOR  Take 1 tablet (40 mg total) by mouth daily.     spironolactone 25 MG tablet  Commonly known as:  ALDACTONE  Take 12.5 mg by mouth daily.     Vitamin D (Ergocalciferol) 50000 UNITS Caps capsule  Commonly known as:  DRISDOL  Take 1 capsule by mouth every Saturday.       Allergies  Allergen Reactions  . Sulfa Antibiotics Anaphylaxis and Swelling    Stiff neck also       The results of significant diagnostics from this hospitalization (including imaging, microbiology, ancillary and laboratory) are listed below for reference.    Significant Diagnostic Studies: Dg Chest Port 1 View  09/18/2014   CLINICAL DATA:   Respiratory failure  EXAM: PORTABLE CHEST - 1 VIEW  COMPARISON:  September 15, 2014  FINDINGS: Endotracheal tube and nasogastric tube have been removed. Central catheter tip is in the superior vena cava. No pneumothorax. There is no edema or consolidation. Heart is enlarged with pulmonary vascularity within normal limits. No adenopathy. Patient is status post coronary artery bypass grafting.  IMPRESSION: No pneumothorax.  Stable cardiomegaly.  No edema or consolidation.   Electronically Signed   By: Lowella Grip M.D.  On: 09/18/2014 07:31   Portable Chest Xray  09/15/2014   CLINICAL DATA:  Assess for endotracheal tube and central line placement.  EXAM: PORTABLE CHEST - 1 VIEW  COMPARISON:  None.  FINDINGS: The heart size is enlarged. Mediastinal contour is normal. Endotracheal tube is identified with distal tip probably in the carina. Retraction by 4 cm is recommended. Nasogastric tube is identified with distal tip not included on the film but is at least in the stomach. A left jugular central venous line is identified with distal tip is. Neck cava. There is no pneumothorax. There is no focal infiltrate, pulmonary edema, or pleural effusion. The osseous structures are stable.  IMPRESSION: Endotracheal tube with distal tip probably in the carina. Retraction by 4 cm is recommended.  Left jugular central venous line in good position. There is no pneumothorax. Nasogastric tube is identified with distal tip not included on the film but is at least in the stomach.  These results will be called to the ordering clinician or representative by the Radiologist Assistant, and communication documented in the PACS or zVision Dashboard.   Electronically Signed   By: Abelardo Diesel M.D.   On: 09/15/2014 19:18   Dg Chest Port 1 View  09/15/2014   CLINICAL DATA:  Diabetes. Hyperglycemia. Elevated blood sugar. Blood sugar of 600.  EXAM: PORTABLE CHEST - 1 VIEW  COMPARISON:  07/29/2014.  FINDINGS: Low volume chest. CABG.  Monitoring leads project over the chest. Patient is rotated to the RIGHT. Mild basilar atelectasis. No focal consolidation. Cardiopericardial silhouette is within normal limits for low volumes.  IMPRESSION: Low volume chest.   Electronically Signed   By: Dereck Ligas M.D.   On: 09/15/2014 17:29    Microbiology: No results found for this or any previous visit (from the past 240 hour(s)).   Labs: Basic Metabolic Panel:  Recent Labs Lab 10/08/14 1243 10/08/14 2226 10/09/14 0610  NA 138  --  140  K 3.6  --  3.4*  CL 103  --  105  CO2 23  --  24  GLUCOSE 55* 510* 55*  BUN 24*  --  21  CREATININE 0.98  --  1.04  CALCIUM 9.2  --  9.0   Liver Function Tests: No results for input(s): AST, ALT, ALKPHOS, BILITOT, PROT, ALBUMIN in the last 168 hours. No results for input(s): LIPASE, AMYLASE in the last 168 hours. No results for input(s): AMMONIA in the last 168 hours. CBC:  Recent Labs Lab 10/08/14 1243  WBC 5.9  HGB 12.4  HCT 36.6  MCV 89.3  PLT 309   Cardiac Enzymes: No results for input(s): CKTOTAL, CKMB, CKMBINDEX, TROPONINI in the last 168 hours. BNP: BNP (last 3 results)  Recent Labs  11/14/13 0405 06/15/14 1035 07/26/14 1800  PROBNP 1470.0* 650.1* 2156.0*   CBG:  Recent Labs Lab 10/09/14 0406 10/09/14 0427 10/09/14 0452 10/09/14 0752 10/09/14 1139  GLUCAP 53* 58* 75 75 76       SignedDebbe Odea, MD [facility] 10/09/2014, 2:40 PM

## 2014-10-09 NOTE — Progress Notes (Signed)
Hypoglycemic Event  CBG: 54  Treatment: 15 GM carbohydrate snack  Symptoms: None  Follow-up CBG: [id number] CBG Result:58 Possible Reasons for Event: medication regime Comments/MD notified:will call    Brahim Dolman, Gerlene Burdock  Remember to initiate Hypoglycemia Order Set & complete

## 2014-10-09 NOTE — Progress Notes (Signed)
Hypoglycemic Event  CBG: 58  Treatment: 15 GM carbohydrate snack  Symptoms: None  Follow-up CBG:  Time 0430 CBG Result:75  Possible Reasons for Event: medication regime  Comments/MD notified:will call    Alexyia Guarino, Gerlene Burdock  Remember to initiate Hypoglycemia Order Set & complete

## 2014-10-17 ENCOUNTER — Telehealth (HOSPITAL_COMMUNITY): Payer: Self-pay | Admitting: Vascular Surgery

## 2014-10-17 NOTE — Telephone Encounter (Signed)
Refill Carvedilol Spironolactone

## 2014-10-18 MED ORDER — CARVEDILOL 6.25 MG PO TABS: 6.2500 mg | ORAL_TABLET | Freq: Two times a day (BID) | ORAL | Status: DC

## 2014-10-18 MED ORDER — SPIRONOLACTONE 25 MG PO TABS: 12.5000 mg | ORAL_TABLET | Freq: Every day | ORAL | Status: DC

## 2014-10-19 ENCOUNTER — Ambulatory Visit: Payer: BC Managed Care – PPO | Admitting: Nurse Practitioner

## 2014-10-20 ENCOUNTER — Inpatient Hospital Stay (HOSPITAL_COMMUNITY)
Admission: EM | Admit: 2014-10-20 | Discharge: 2014-10-25 | DRG: 637 | Disposition: A | Discharge status: Skilled Nursing Facility | Payer: BC Managed Care – PPO | Attending: Internal Medicine | Admitting: Internal Medicine

## 2014-10-20 ENCOUNTER — Encounter (HOSPITAL_COMMUNITY): Payer: Self-pay

## 2014-10-20 DIAGNOSIS — I130 Hypertensive heart and chronic kidney disease with heart failure and stage 1 through stage 4 chronic kidney disease, or unspecified chronic kidney disease: Secondary | ICD-10-CM | POA: Diagnosis not present

## 2014-10-20 DIAGNOSIS — R4182 Altered mental status, unspecified: Secondary | ICD-10-CM | POA: Diagnosis present

## 2014-10-20 DIAGNOSIS — F329 Major depressive disorder, single episode, unspecified: Secondary | ICD-10-CM | POA: Diagnosis present

## 2014-10-20 DIAGNOSIS — I5022 Chronic systolic (congestive) heart failure: Secondary | ICD-10-CM | POA: Diagnosis not present

## 2014-10-20 DIAGNOSIS — Z8611 Personal history of tuberculosis: Secondary | ICD-10-CM

## 2014-10-20 DIAGNOSIS — I5043 Acute on chronic combined systolic (congestive) and diastolic (congestive) heart failure: Secondary | ICD-10-CM | POA: Diagnosis not present

## 2014-10-20 DIAGNOSIS — N183 Chronic kidney disease, stage 3 (moderate): Secondary | ICD-10-CM | POA: Diagnosis present

## 2014-10-20 DIAGNOSIS — E875 Hyperkalemia: Secondary | ICD-10-CM | POA: Diagnosis not present

## 2014-10-20 DIAGNOSIS — R5381 Other malaise: Secondary | ICD-10-CM | POA: Diagnosis not present

## 2014-10-20 DIAGNOSIS — E1065 Type 1 diabetes mellitus with hyperglycemia: Secondary | ICD-10-CM

## 2014-10-20 DIAGNOSIS — K3184 Gastroparesis: Secondary | ICD-10-CM | POA: Diagnosis present

## 2014-10-20 DIAGNOSIS — E785 Hyperlipidemia, unspecified: Secondary | ICD-10-CM | POA: Diagnosis present

## 2014-10-20 DIAGNOSIS — E876 Hypokalemia: Secondary | ICD-10-CM | POA: Diagnosis not present

## 2014-10-20 DIAGNOSIS — E10649 Type 1 diabetes mellitus with hypoglycemia without coma: Secondary | ICD-10-CM | POA: Diagnosis not present

## 2014-10-20 DIAGNOSIS — Z794 Long term (current) use of insulin: Secondary | ICD-10-CM | POA: Diagnosis not present

## 2014-10-20 DIAGNOSIS — B3789 Other sites of candidiasis: Secondary | ICD-10-CM | POA: Diagnosis not present

## 2014-10-20 DIAGNOSIS — I447 Left bundle-branch block, unspecified: Secondary | ICD-10-CM | POA: Diagnosis present

## 2014-10-20 DIAGNOSIS — Z79899 Other long term (current) drug therapy: Secondary | ICD-10-CM

## 2014-10-20 DIAGNOSIS — N184 Chronic kidney disease, stage 4 (severe): Secondary | ICD-10-CM | POA: Diagnosis present

## 2014-10-20 DIAGNOSIS — M810 Age-related osteoporosis without current pathological fracture: Secondary | ICD-10-CM | POA: Diagnosis present

## 2014-10-20 DIAGNOSIS — R400 Somnolence: Secondary | ICD-10-CM

## 2014-10-20 DIAGNOSIS — E039 Hypothyroidism, unspecified: Secondary | ICD-10-CM | POA: Diagnosis present

## 2014-10-20 DIAGNOSIS — D539 Nutritional anemia, unspecified: Secondary | ICD-10-CM | POA: Diagnosis not present

## 2014-10-20 DIAGNOSIS — I959 Hypotension, unspecified: Secondary | ICD-10-CM | POA: Diagnosis not present

## 2014-10-20 DIAGNOSIS — E1011 Type 1 diabetes mellitus with ketoacidosis with coma: Secondary | ICD-10-CM

## 2014-10-20 DIAGNOSIS — E1110 Type 2 diabetes mellitus with ketoacidosis without coma: Secondary | ICD-10-CM | POA: Diagnosis present

## 2014-10-20 DIAGNOSIS — E1049 Type 1 diabetes mellitus with other diabetic neurological complication: Secondary | ICD-10-CM

## 2014-10-20 DIAGNOSIS — Z6823 Body mass index (BMI) 23.0-23.9, adult: Secondary | ICD-10-CM | POA: Diagnosis not present

## 2014-10-20 DIAGNOSIS — E1010 Type 1 diabetes mellitus with ketoacidosis without coma: Principal | ICD-10-CM | POA: Diagnosis present

## 2014-10-20 DIAGNOSIS — G9341 Metabolic encephalopathy: Secondary | ICD-10-CM | POA: Diagnosis present

## 2014-10-20 DIAGNOSIS — I214 Non-ST elevation (NSTEMI) myocardial infarction: Secondary | ICD-10-CM | POA: Diagnosis not present

## 2014-10-20 DIAGNOSIS — R0602 Shortness of breath: Secondary | ICD-10-CM

## 2014-10-20 DIAGNOSIS — K219 Gastro-esophageal reflux disease without esophagitis: Secondary | ICD-10-CM | POA: Diagnosis present

## 2014-10-20 DIAGNOSIS — I252 Old myocardial infarction: Secondary | ICD-10-CM | POA: Diagnosis not present

## 2014-10-20 DIAGNOSIS — I10 Essential (primary) hypertension: Secondary | ICD-10-CM

## 2014-10-20 DIAGNOSIS — R1110 Vomiting, unspecified: Secondary | ICD-10-CM

## 2014-10-20 DIAGNOSIS — N179 Acute kidney failure, unspecified: Secondary | ICD-10-CM

## 2014-10-20 DIAGNOSIS — N390 Urinary tract infection, site not specified: Secondary | ICD-10-CM | POA: Diagnosis present

## 2014-10-20 DIAGNOSIS — Z951 Presence of aortocoronary bypass graft: Secondary | ICD-10-CM

## 2014-10-20 DIAGNOSIS — I2510 Atherosclerotic heart disease of native coronary artery without angina pectoris: Secondary | ICD-10-CM | POA: Diagnosis present

## 2014-10-20 DIAGNOSIS — E1159 Type 2 diabetes mellitus with other circulatory complications: Secondary | ICD-10-CM | POA: Diagnosis present

## 2014-10-20 DIAGNOSIS — E1043 Type 1 diabetes mellitus with diabetic autonomic (poly)neuropathy: Secondary | ICD-10-CM | POA: Diagnosis present

## 2014-10-20 DIAGNOSIS — R6510 Systemic inflammatory response syndrome (SIRS) of non-infectious origin without acute organ dysfunction: Secondary | ICD-10-CM | POA: Diagnosis not present

## 2014-10-20 DIAGNOSIS — N170 Acute kidney failure with tubular necrosis: Secondary | ICD-10-CM | POA: Diagnosis present

## 2014-10-20 DIAGNOSIS — E1143 Type 2 diabetes mellitus with diabetic autonomic (poly)neuropathy: Secondary | ICD-10-CM

## 2014-10-20 DIAGNOSIS — Z7902 Long term (current) use of antithrombotics/antiplatelets: Secondary | ICD-10-CM

## 2014-10-20 DIAGNOSIS — IMO0002 Reserved for concepts with insufficient information to code with codable children: Secondary | ICD-10-CM

## 2014-10-20 DIAGNOSIS — Z7982 Long term (current) use of aspirin: Secondary | ICD-10-CM

## 2014-10-20 DIAGNOSIS — I152 Hypertension secondary to endocrine disorders: Secondary | ICD-10-CM | POA: Diagnosis present

## 2014-10-20 DIAGNOSIS — E43 Unspecified severe protein-calorie malnutrition: Secondary | ICD-10-CM | POA: Diagnosis not present

## 2014-10-20 LAB — COMPREHENSIVE METABOLIC PANEL
ALK PHOS: 139 U/L — AB (ref 39–117)
ALT: 17 U/L (ref 0–35)
AST: 30 U/L (ref 0–37)
Albumin: 2.8 g/dL — ABNORMAL LOW (ref 3.5–5.2)
BILIRUBIN TOTAL: 2.1 mg/dL — AB (ref 0.3–1.2)
BUN: 23 mg/dL (ref 6–23)
CHLORIDE: 102 mmol/L (ref 96–112)
CREATININE: 3.01 mg/dL — AB (ref 0.50–1.10)
Calcium: 8.3 mg/dL — ABNORMAL LOW (ref 8.4–10.5)
GFR calc Af Amer: 18 mL/min — ABNORMAL LOW (ref >90)
GFR calc non Af Amer: 15 mL/min — ABNORMAL LOW (ref >90)
Glucose, Bld: 597 mg/dL (ref 70–99)
POTASSIUM: 4.8 mmol/L (ref 3.5–5.1)
Sodium: 135 mmol/L (ref 135–145)
Total Protein: 5.7 g/dL — ABNORMAL LOW (ref 6.0–8.3)

## 2014-10-20 LAB — CBG MONITORING, ED
GLUCOSE-CAPILLARY: 502 mg/dL — AB (ref 70–99)
Glucose-Capillary: 546 mg/dL — ABNORMAL HIGH (ref 70–99)
Glucose-Capillary: 480 mg/dL — ABNORMAL HIGH (ref 70–99)
Glucose-Capillary: 367 mg/dL — ABNORMAL HIGH (ref 70–99)

## 2014-10-20 LAB — CBC WITH DIFFERENTIAL/PLATELET
BASOS PCT: 0 % (ref 0–1)
Basophils Absolute: 0 10*3/uL (ref 0.0–0.1)
Eosinophils Absolute: 0 10*3/uL (ref 0.0–0.7)
Eosinophils Relative: 0 % (ref 0–5)
HCT: 31.6 % — ABNORMAL LOW (ref 36.0–46.0)
Hemoglobin: 9.9 g/dL — ABNORMAL LOW (ref 12.0–15.0)
LYMPHS PCT: 6 % — AB (ref 12–46)
Lymphs Abs: 1.1 10*3/uL (ref 0.7–4.0)
MCH: 30.2 pg (ref 26.0–34.0)
MCHC: 31.3 g/dL (ref 30.0–36.0)
MCV: 96.3 fL (ref 78.0–100.0)
MONOS PCT: 8 % (ref 3–12)
Monocytes Absolute: 1.5 10*3/uL — ABNORMAL HIGH (ref 0.1–1.0)
NEUTROS PCT: 86 % — AB (ref 43–77)
Neutro Abs: 16.2 10*3/uL — ABNORMAL HIGH (ref 1.7–7.7)
PLATELETS: 312 10*3/uL (ref 150–400)
RBC: 3.28 MIL/uL — ABNORMAL LOW (ref 3.87–5.11)
RDW: 14.9 % (ref 11.5–15.5)
WBC: 18.8 10*3/uL — ABNORMAL HIGH (ref 4.0–10.5)

## 2014-10-20 LAB — I-STAT CHEM 8, ED
BUN: 30 mg/dL — AB (ref 6–23)
CREATININE: 1.7 mg/dL — AB (ref 0.50–1.10)
Calcium, Ion: 1.18 mmol/L (ref 1.13–1.30)
Chloride: 104 mmol/L (ref 96–112)
Glucose, Bld: 613 mg/dL (ref 70–99)
HCT: 36 % (ref 36.0–46.0)
Hemoglobin: 12.2 g/dL (ref 12.0–15.0)
POTASSIUM: 5.5 mmol/L — AB (ref 3.5–5.1)
Sodium: 131 mmol/L — ABNORMAL LOW (ref 135–145)
TCO2: 6 mmol/L (ref 0–100)

## 2014-10-20 LAB — I-STAT ARTERIAL BLOOD GAS, ED
Acid-base deficit: 27 mmol/L — ABNORMAL HIGH (ref 0.0–2.0)
BICARBONATE: 2.9 meq/L — AB (ref 20.0–24.0)
O2 Saturation: 98 %
Patient temperature: 98.6
Pressure control: 1 cmH2O
pCO2 arterial: 12.9 mmHg — CL (ref 35.0–45.0)
pH, Arterial: 6.952 — CL (ref 7.350–7.450)
pO2, Arterial: 155 mmHg — ABNORMAL HIGH (ref 80.0–100.0)

## 2014-10-20 LAB — LIPASE, BLOOD: Lipase: 22 U/L (ref 11–59)

## 2014-10-20 MED ORDER — SODIUM CHLORIDE 0.9 % IV BOLUS (SEPSIS)
1000.0000 mL | Freq: Once | INTRAVENOUS | Status: AC
Start: 2014-10-20 — End: 2014-10-20
  Administered 2014-10-20: 1000 mL via INTRAVENOUS

## 2014-10-20 MED ORDER — HEPARIN SODIUM (PORCINE) 5000 UNIT/ML IJ SOLN
5000.0000 [IU] | Freq: Three times a day (TID) | INTRAMUSCULAR | Status: DC
  Administered 2014-10-21 – 2014-10-23 (×8): 5000 [IU] via SUBCUTANEOUS
  Filled 2014-10-20 (×12): qty 1

## 2014-10-20 MED ORDER — SODIUM CHLORIDE 0.9 % IV SOLN
INTRAVENOUS | Status: DC
  Administered 2014-10-20: 4.2 [IU]/h via INTRAVENOUS
  Filled 2014-10-20: qty 2.5

## 2014-10-20 MED ORDER — HYOSCYAMINE SULFATE 0.125 MG PO TBDP
0.1250 mg | ORAL_TABLET | ORAL | Status: DC | PRN
  Filled 2014-10-20: qty 1

## 2014-10-20 MED ORDER — METOCLOPRAMIDE HCL 10 MG PO TABS
10.0000 mg | ORAL_TABLET | Freq: Three times a day (TID) | ORAL | Status: DC
  Administered 2014-10-21: 10 mg via ORAL
  Filled 2014-10-20 (×6): qty 1

## 2014-10-20 MED ORDER — ALENDRONATE SODIUM 70 MG PO TABS: 70.0000 mg | ORAL_TABLET | ORAL | Status: DC

## 2014-10-20 MED ORDER — CEFTRIAXONE SODIUM IN DEXTROSE 20 MG/ML IV SOLN
1.0000 g | INTRAVENOUS | Status: DC
  Administered 2014-10-21 – 2014-10-24 (×4): 1 g via INTRAVENOUS
  Filled 2014-10-20 (×4): qty 50

## 2014-10-20 MED ORDER — SODIUM CHLORIDE 0.9 % IV SOLN
INTRAVENOUS | Status: DC
  Administered 2014-10-21: 2.4 [IU]/h via INTRAVENOUS
  Administered 2014-10-21: 4 [IU]/h via INTRAVENOUS
  Administered 2014-10-22: 1 [IU]/h via INTRAVENOUS

## 2014-10-20 MED ORDER — CARVEDILOL 6.25 MG PO TABS
6.2500 mg | ORAL_TABLET | Freq: Two times a day (BID) | ORAL | Status: DC
  Administered 2014-10-21: 6.25 mg via ORAL
  Filled 2014-10-20 (×3): qty 1

## 2014-10-20 MED ORDER — LEVOTHYROXINE SODIUM 125 MCG PO TABS
125.0000 ug | ORAL_TABLET | Freq: Every day | ORAL | Status: DC
  Administered 2014-10-21: 125 ug via ORAL
  Filled 2014-10-20 (×2): qty 1

## 2014-10-20 MED ORDER — POTASSIUM CHLORIDE 10 MEQ/100ML IV SOLN: 10.0000 meq | INTRAVENOUS | Status: DC

## 2014-10-20 MED ORDER — ROSUVASTATIN CALCIUM 40 MG PO TABS
40.0000 mg | ORAL_TABLET | Freq: Every day | ORAL | Status: DC
  Filled 2014-10-20: qty 1

## 2014-10-20 MED ORDER — DEXTROSE-NACL 5-0.45 % IV SOLN: INTRAVENOUS | Status: DC

## 2014-10-20 MED ORDER — SODIUM CHLORIDE 0.9 % IV SOLN: INTRAVENOUS | Status: DC

## 2014-10-20 MED ORDER — ESCITALOPRAM OXALATE 10 MG PO TABS
10.0000 mg | ORAL_TABLET | Freq: Every day | ORAL | Status: DC
  Filled 2014-10-20: qty 1

## 2014-10-20 MED ORDER — DEXTROSE-NACL 5-0.45 % IV SOLN
INTRAVENOUS | Status: DC
  Administered 2014-10-21: 01:00:00 via INTRAVENOUS

## 2014-10-20 MED ORDER — ASPIRIN EC 81 MG PO TBEC
81.0000 mg | DELAYED_RELEASE_TABLET | Freq: Every day | ORAL | Status: DC
  Filled 2014-10-20: qty 1

## 2014-10-20 MED ORDER — PANTOPRAZOLE SODIUM 40 MG PO TBEC: 40.0000 mg | DELAYED_RELEASE_TABLET | Freq: Every day | ORAL | Status: DC

## 2014-10-20 MED ORDER — SODIUM CHLORIDE 0.9 % IV SOLN: INTRAVENOUS | Status: AC

## 2014-10-20 MED ORDER — CLOPIDOGREL BISULFATE 75 MG PO TABS
75.0000 mg | ORAL_TABLET | Freq: Every day | ORAL | Status: DC
  Administered 2014-10-21 – 2014-10-25 (×5): 75 mg via ORAL
  Filled 2014-10-20 (×6): qty 1

## 2014-10-20 NOTE — ED Notes (Addendum)
Per EMS, patient CBG reading 518 around 1100 today, given 34 units insulin by husband and came down to 311. Around 1800 CBG read high and patient was minimally responsive. EMS called, kussmaul respirations on arrival. Given 1520ml NS, CBG 546 on arrival.

## 2014-10-20 NOTE — H&P (Signed)
[facility] History and Physical  [name] [id number] DOB: 1951-08-08 DOA: 10/20/2014  Referring physician: Eulas Post, PA PCP: [name], FNP   Chief Complaint: DKA  HPI: Bridget Martinez is a 64 y.o. female presents witth DKA. Patient is not able to provide any history., There is no body here to give a history. Apparently she has had a prior history of DKA presentation and was just discharged in January of this year. Today she had been noted to have an elevated glucose for she was given insulin by her husband. After having received 34 units she had a glucose reading of 311. Later in the evening though she had a HIGH reading prompting the husband to call EMS. Upon arrival she was given IV saline and a CBG was rechecked and found to be 546 here. Currently she is altered and confused. She is noted to be tachycardic also. She had a ABG done and this shows a pH of 6.9 there is no obvious source of infection noted.    Review of Systems:  Patient not able to provide ROS  Past Medical History  Diagnosis Date  . Diabetes mellitus     on insulin, with h/o DKA   . HTN (hypertension)   . Hyperlipidemia   . Venous stasis ulcers   . Arthritis   . Depression     anxiety  . 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 Hypertension   . Coronary artery disease     angina.  MI.   . Pneumonia        . GERD (gastroesophageal reflux disease)     Hiatal hernia.   . Hypothyroidism   . CHF (congestive heart failure)   . Tuberculosis   . Osteoporosis    Past Surgical History  Procedure Laterality Date  . Cardiac catheterization    . Tonsillectomy    . Coronary artery bypass graft      Dr. Prescott Gum in 09/2011   . Coronary artery bypass graft  2012  . Esophagogastroduodenoscopy N/A 11/17/2013    Procedure: ESOPHAGOGASTRODUODENOSCOPY (EGD);  Surgeon: Lafayette Dragon, MD;  Location: [facility] ENDOSCOPY;  Service: Endoscopy;   Laterality: N/A;  . Colonoscopy N/A 11/18/2013    Procedure: COLONOSCOPY;  Surgeon: Lafayette Dragon, MD;  Location: [facility] ENDOSCOPY;  Service: Endoscopy;  Laterality: N/A;  . Right heart catheterization N/A 07/29/2011    Procedure: RIGHT HEART CATH;  Surgeon: Minus Breeding, MD;  Location: [facility] CATH LAB;  Service: Cardiovascular;  Laterality: N/A;  . Left heart catheterization with coronary angiogram N/A 07/26/2011    Procedure: LEFT HEART CATHETERIZATION WITH CORONARY ANGIOGRAM;  Surgeon: Larey Dresser, MD;  Location: [facility] CATH LAB;  Service: Cardiovascular;  Laterality: N/A;   Social History:  reports that she has never smoked. She does not have any smokeless tobacco history on file. She reports that she does not drink alcohol or use illicit drugs.  Allergies  Allergen Reactions  . Sulfa Antibiotics Anaphylaxis and Swelling    Stiff neck also     Family History  Problem Relation Age of Onset  . Heart disease Father   . Multiple sclerosis Father   . Hypertension Mother   . Hyperthyroidism Mother   . Diabetes Cousin     Multiple maternal cousins with type 2 diabetes mellitus  . Diabetes Maternal Uncle     Type 1 diabetes mellitus  . Heart attack Paternal Grandfather   . Heart  disease Paternal Grandfather      Prior to Admission medications   Medication Sig Start Date End Date Taking? Authorizing Provider  alendronate (FOSAMAX) 70 MG tablet Take 70 mg by mouth every Saturday.  11/04/13   Historical Provider, MD  aspirin EC 81 MG tablet Take 1 tablet (81 mg total) by mouth daily. 06/15/14   Rande Brunt, NP  carvedilol (COREG) 6.25 MG tablet Take 1 tablet (6.25 mg total) by mouth 2 (two) times daily with a meal. 10/18/14   Jolaine Artist, MD  clopidogrel (PLAVIX) 75 MG tablet Take 1 tablet (75 mg total) by mouth daily with breakfast. 01/12/14   Lysbeth Penner, FNP  escitalopram (LEXAPRO) 10 MG tablet Take 1 tablet (10 mg total) by mouth daily. 09/27/13   Lysbeth Penner, FNP  furosemide  (LASIX) 40 MG tablet Take 1 tablet (40 mg total) by mouth daily. 10/29/13   Bobby Rumpf York, PA-C  hyoscyamine (LEVSIN, ANASPAZ) 0.125 MG tablet Take 0.125 mg by mouth every 4 (four) hours as needed for bladder spasms.    Historical Provider, MD  insulin aspart (NOVOLOG) 100 UNIT/ML injection Inject 3 Units into the skin 3 (three) times daily with meals. Patient taking differently: Inject 9-20 Units into the skin 3 (three) times daily with meals. On sliding scale 09/21/14   Nishant Dhungel, MD  insulin detemir (LEVEMIR) 100 UNIT/ML injection Inject 0.07 mLs (7 Units total) into the skin at bedtime. Patient taking differently: Inject 14 Units into the skin 2 (two) times daily.  09/21/14   Nishant Dhungel, MD  levothyroxine (SYNTHROID, LEVOTHROID) 125 MCG tablet Take 1 tablet (125 mcg total) by mouth daily. 09/27/13   Lysbeth Penner, FNP  metoCLOPramide (REGLAN) 10 MG tablet Take 1 tablet (10 mg total) by mouth 4 (four) times daily -  before meals and at bedtime. 10/09/14   Debbe Odea, MD  ondansetron (ZOFRAN ODT) 8 MG disintegrating tablet Take 1 tablet (8 mg total) by mouth every 8 (eight) hours as needed for nausea or vomiting. 10/09/14   Debbe Odea, MD  pantoprazole (PROTONIX) 40 MG tablet Take 1 tablet (40 mg total) by mouth daily. 07/31/14 03/30/17  Ripudeep Krystal Eaton, MD  rosuvastatin (CRESTOR) 40 MG tablet Take 1 tablet (40 mg total) by mouth daily. 09/27/13   Lysbeth Penner, FNP  spironolactone (ALDACTONE) 25 MG tablet Take 0.5 tablets (12.5 mg total) by mouth daily. 10/18/14   Jolaine Artist, MD  Vitamin D, Ergocalciferol, (DRISDOL) 50000 UNITS CAPS capsule Take 1 capsule by mouth every Saturday.  09/29/13   Historical Provider, MD   Physical Exam: Filed Vitals:   10/20/14 2000 10/20/14 2030 10/20/14 2100 10/20/14 2130  BP: 102/33 119/40 121/98 125/52  Pulse: 116 112 114 111  Temp:      TempSrc:      Resp: 20 24 26 21   Height:      Weight:      SpO2: 100% 100% 100% 100%    Wt Readings  from Last 3 Encounters:  10/20/14 65.772 kg (145 lb)  10/08/14 65.862 kg (145 lb 3.2 oz)  09/21/14 73.4 kg (161 lb 13.1 oz)    General:  Appears calm and comfortable Eyes: PERRL, normal lids, irises & conjunctiva ENT: grossly normal hearing, lips & tongue Neck: no LAD, masses or thyromegaly Cardiovascular: RRR, no m/r/g. No LE edema. Respiratory: CTA bilaterally, no w/r/r. Normal respiratory effort. Abdomen: soft, ntnd Skin: no rash or induration seen on limited exam Musculoskeletal: grossly normal  tone BUE/BLE Psychiatric: altered Neurologic: unable to assess.          Labs on Admission:  Basic Metabolic Panel:  Recent Labs Lab 10/20/14 1951 10/20/14 2048  NA 135 131*  K 4.8 5.5*  CL 102 104  CO2 <5*  --   GLUCOSE 597* 613*  BUN 23 30*  CREATININE 3.01* 1.70*  CALCIUM 8.3*  --    Liver Function Tests:  Recent Labs Lab 10/20/14 1951  AST 30  ALT 17  ALKPHOS 139*  BILITOT 2.1*  PROT 5.7*  ALBUMIN 2.8*    Recent Labs Lab 10/20/14 1951  LIPASE 22   No results for input(s): AMMONIA in the last 168 hours. CBC:  Recent Labs Lab 10/20/14 1951 10/20/14 2048  WBC 18.8*  --   NEUTROABS 16.2*  --   HGB 9.9* 12.2  HCT 31.6* 36.0  MCV 96.3  --   PLT 312  --    Cardiac Enzymes: No results for input(s): CKTOTAL, CKMB, CKMBINDEX, TROPONINI in the last 168 hours.  BNP (last 3 results)  Recent Labs  09/19/14 1126  BNP 298.2*    ProBNP (last 3 results)  Recent Labs  11/14/13 0405 06/15/14 1035 07/26/14 1800  PROBNP 1470.0* 650.1* 2156.0*    CBG:  Recent Labs Lab 10/20/14 1917 10/20/14 2025 10/20/14 2123 10/20/14 2219  GLUCAP 546* 480* 502* 367*    Radiological Exams on Admission: No results found.    Assessment/Plan Active Problems:   Hyperlipidemia   DKA (diabetic ketoacidoses)   Hypertension   CKD (chronic kidney disease), stage III   Altered mental status   1. DKA -will admit to step down unit -started on  glucostabilizer insulin drip -will hydrate with IV Fluids -check Mg and Phosphate -monitor potassium -emperic antibiotics  2. HTN -currently pressure is soft -will hold diuretics and hypertensive drugs -monitor pressures  3. CKD III -monitor labs -Hydrate as tolerated  4. Altered Mental Status -due to DKA -will monitor  5. Hyperrlipidemia -on statins  Code Status: Full Code (must indicate code status--if unknown or must be presumed, indicate so) DVT Prophylaxis:Heparin Family Communication: None (indicate person spoken with, if applicable, with phone number if by telephone) Disposition Plan: Home (indicate anticipated LOS)  Time spent: 24min  KHAN,SAADAT A [facility] Pager [phone]

## 2014-10-20 NOTE — ED Provider Notes (Signed)
Date: 10/20/2014  Rate: 118  Rhythm: sinus tachycardia  QRS Axis: normal  Intervals: normal  ST/T Wave abnormalities: nonspecific ST/T changes  Conduction Disutrbances:left bundle branch block  Narrative Interpretation:   Old EKG Reviewed: none available EKG not available in epic for interpretation in [place], MD 10/22/14 [id number]

## 2014-10-20 NOTE — ED Notes (Signed)
Attempted in and out cath at this time. No urine returned. Patient bed was wet. Linens changed. Will retry at later time.

## 2014-10-20 NOTE — ED Notes (Signed)
Pt. States she is starting to feel better.

## 2014-10-20 NOTE — ED Provider Notes (Signed)
CSN: [id number]     Arrival date & time 10/20/14  1915 History   First MD Initiated Contact with Patient 10/20/14 1926     Chief Complaint  Patient presents with  . Diabetic Ketoacidosis    Level V Caveat: DKA  (Consider location/radiation/quality/duration/timing/severity/associated sxs/prior Treatment) HPI Bridget Martinez is a 64 y.o. female history of insulin dependent diabetes and DKA comes in for evaluation of hyperglycemia. Per EMS and nursing staff, patient's CBG was 518 at approximately 11:00 this morning, she was given 34 units of her insulin by her husband in the CBG reduced to 311. According to EMS at approximately 6:00 this evening the patient's CBG monitoring read "high" and patient was minimally responsive. At this point the husband called EMS and upon EMS arrival patient is having 2 small respirations. She was given 1500 mL of normal saline en route. CBG 546 on arrival.  Patient typically takes 7 of Levemir and 3 of NovoLog. Recently discharged from hospital on [id number] insulin complications Attempted to contact patient's husband on home phone and cell phone without success.   Past Medical History  Diagnosis Date  . Diabetes mellitus     on insulin, with h/o DKA   . HTN (hypertension)   . Hyperlipidemia   . Venous stasis ulcers   . Arthritis   . Depression     anxiety  . 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 Hypertension   . Coronary artery disease     angina.  MI.   . Pneumonia        . GERD (gastroesophageal reflux disease)     Hiatal hernia.   . Hypothyroidism   . CHF (congestive heart failure)   . Tuberculosis   . Osteoporosis    Past Surgical History  Procedure Laterality Date  . Cardiac catheterization    . Tonsillectomy    . Coronary artery bypass graft      Dr. Prescott Gum in 09/2011   . Coronary artery bypass graft  2012  . Esophagogastroduodenoscopy N/A 11/17/2013    Procedure: ESOPHAGOGASTRODUODENOSCOPY  (EGD);  Surgeon: Lafayette Dragon, MD;  Location: [facility] ENDOSCOPY;  Service: Endoscopy;  Laterality: N/A;  . Colonoscopy N/A 11/18/2013    Procedure: COLONOSCOPY;  Surgeon: Lafayette Dragon, MD;  Location: [facility] ENDOSCOPY;  Service: Endoscopy;  Laterality: N/A;  . Right heart catheterization N/A 07/29/2011    Procedure: RIGHT HEART CATH;  Surgeon: Minus Breeding, MD;  Location: [facility] CATH LAB;  Service: Cardiovascular;  Laterality: N/A;  . Left heart catheterization with coronary angiogram N/A 07/26/2011    Procedure: LEFT HEART CATHETERIZATION WITH CORONARY ANGIOGRAM;  Surgeon: Larey Dresser, MD;  Location: [facility] CATH LAB;  Service: Cardiovascular;  Laterality: N/A;   Family History  Problem Relation Age of Onset  . Heart disease Father   . Multiple sclerosis Father   . Hypertension Mother   . Hyperthyroidism Mother   . Diabetes Cousin     Multiple maternal cousins with type 2 diabetes mellitus  . Diabetes Maternal Uncle     Type 1 diabetes mellitus  . Heart attack Paternal Grandfather   . Heart disease Paternal Grandfather    History  Substance Use Topics  . Smoking status: Never Smoker   . Smokeless tobacco: Not on file  . Alcohol Use: No   OB History    No data available     Review of Systems  Unable to perform ROS  Allergies  Sulfa antibiotics  Home Medications   Prior to Admission medications   Medication Sig Start Date End Date Taking? Authorizing Provider  alendronate (FOSAMAX) 70 MG tablet Take 70 mg by mouth every Saturday.  11/04/13   Historical Provider, MD  aspirin EC 81 MG tablet Take 1 tablet (81 mg total) by mouth daily. 06/15/14   Rande Brunt, NP  carvedilol (COREG) 6.25 MG tablet Take 1 tablet (6.25 mg total) by mouth 2 (two) times daily with a meal. 10/18/14   Jolaine Artist, MD  clopidogrel (PLAVIX) 75 MG tablet Take 1 tablet (75 mg total) by mouth daily with breakfast. 01/12/14   Lysbeth Penner, FNP  escitalopram (LEXAPRO) 10 MG tablet Take 1 tablet (10 mg  total) by mouth daily. 09/27/13   Lysbeth Penner, FNP  furosemide (LASIX) 40 MG tablet Take 1 tablet (40 mg total) by mouth daily. 10/29/13   Bobby Rumpf [place], PA-C  hyoscyamine (LEVSIN, ANASPAZ) 0.125 MG tablet Take 0.125 mg by mouth every 4 (four) hours as needed for bladder spasms.    Historical Provider, MD  insulin aspart (NOVOLOG) 100 UNIT/ML injection Inject 3 Units into the skin 3 (three) times daily with meals. Patient taking differently: Inject 9-20 Units into the skin 3 (three) times daily with meals. On sliding scale 09/21/14   Nishant Dhungel, MD  insulin detemir (LEVEMIR) 100 UNIT/ML injection Inject 0.07 mLs (7 Units total) into the skin at bedtime. Patient taking differently: Inject 14 Units into the skin 2 (two) times daily.  09/21/14   Nishant Dhungel, MD  levothyroxine (SYNTHROID, LEVOTHROID) 125 MCG tablet Take 1 tablet (125 mcg total) by mouth daily. 09/27/13   Lysbeth Penner, FNP  metoCLOPramide (REGLAN) 10 MG tablet Take 1 tablet (10 mg total) by mouth 4 (four) times daily -  before meals and at bedtime. 10/09/14   Debbe Odea, MD  ondansetron (ZOFRAN ODT) 8 MG disintegrating tablet Take 1 tablet (8 mg total) by mouth every 8 (eight) hours as needed for nausea or vomiting. 10/09/14   Debbe Odea, MD  pantoprazole (PROTONIX) 40 MG tablet Take 1 tablet (40 mg total) by mouth daily. 07/31/14 03/30/17  Ripudeep Krystal Eaton, MD  rosuvastatin (CRESTOR) 40 MG tablet Take 1 tablet (40 mg total) by mouth daily. 09/27/13   Lysbeth Penner, FNP  spironolactone (ALDACTONE) 25 MG tablet Take 0.5 tablets (12.5 mg total) by mouth daily. 10/18/14   Jolaine Artist, MD  Vitamin D, Ergocalciferol, (DRISDOL) 50000 UNITS CAPS capsule Take 1 capsule by mouth every Saturday.  09/29/13   Historical Provider, MD   BP 102/33 mmHg  Pulse 116  Temp(Src) 97.8 F (36.6 C) (Rectal)  Resp 20  Ht 5\' 7"  (1.702 m)  Wt 145 lb (65.772 kg)  BMI 22.71 kg/m2  SpO2 100% Physical Exam  Constitutional: She is oriented  to person, place, and time. She appears well-developed and well-nourished.  GCS 15. Pt is alert but cannot give history.  HENT:  Head: Normocephalic and atraumatic.  Mouth/Throat: Oropharynx is clear and moist.  Dry mucous membranes  Eyes: Conjunctivae are normal. Right eye exhibits no discharge. Left eye exhibits no discharge. No scleral icterus.  Neck: Neck supple.  Cardiovascular: Normal rate, regular rhythm and normal heart sounds.   Tachycardic  Pulmonary/Chest: Effort normal and breath sounds normal. No respiratory distress. She has no wheezes. She has no rales.  Kussmaul respirations  Abdominal: Soft. She exhibits no distension and no mass. There is no tenderness.  There is no rebound and no guarding.  Musculoskeletal: She exhibits no edema or tenderness.  Neurological: She is alert and oriented to person, place, and time.  Cranial Nerves II-XII grossly intact  Skin: Skin is warm and dry. No rash noted.  Psychiatric: She has a normal mood and affect.  Nursing note and vitals reviewed.   ED Course  Procedures (including critical care time) Labs Review Labs Reviewed  CBC WITH DIFFERENTIAL/PLATELET - Abnormal; Notable for the following:    WBC 18.8 (*)    RBC 3.28 (*)    Hemoglobin 9.9 (*)    HCT 31.6 (*)    Neutrophils Relative % 86 (*)    Lymphocytes Relative 6 (*)    Neutro Abs 16.2 (*)    Monocytes Absolute 1.5 (*)    All other components within normal limits  COMPREHENSIVE METABOLIC PANEL - Abnormal; Notable for the following:    CO2 <5 (*)    Glucose, Bld 597 (*)    Creatinine, Ser 3.01 (*)    Calcium 8.3 (*)    Total Protein 5.7 (*)    Albumin 2.8 (*)    Alkaline Phosphatase 139 (*)    Total Bilirubin 2.1 (*)    GFR calc non Af Amer 15 (*)    GFR calc Af Amer 18 (*)    All other components within normal limits  CBG MONITORING, ED - Abnormal; Notable for the following:    Glucose-Capillary 546 (*)    All other components within normal limits  I-STAT CHEM 8,  ED - Abnormal; Notable for the following:    Sodium 131 (*)    Potassium 5.5 (*)    BUN 30 (*)    Creatinine, Ser 1.70 (*)    Glucose, Bld 613 (*)    All other components within normal limits  I-STAT ARTERIAL BLOOD GAS, ED - Abnormal; Notable for the following:    pH, Arterial 6.952 (*)    pCO2 arterial 12.9 (*)    pO2, Arterial 155.0 (*)    Bicarbonate 2.9 (*)    Acid-base deficit 27.0 (*)    All other components within normal limits  CBG MONITORING, ED - Abnormal; Notable for the following:    Glucose-Capillary 480 (*)    All other components within normal limits  LIPASE, BLOOD  URINALYSIS, ROUTINE W REFLEX MICROSCOPIC  BLOOD GAS, ARTERIAL    Imaging Review No results found.   Date: 10/21/2014  Rate: 118  Rhythm: sinus tachycardia  QRS Axis: normal  Intervals: normal  ST/T Wave abnormalities: nonspecific ST/T changes  Conduction Disutrbances:left bundle branch block  Narrative Interpretation:   Old EKG Reviewed: none available     Meds given in ED:  Medications  dextrose 5 %-0.45 % sodium chloride infusion (not administered)  insulin regular (NOVOLIN R,HUMULIN R) 250 Units in sodium chloride 0.9 % 250 mL (1 Units/mL) infusion (4.2 Units/hr Intravenous New Bag/Given 10/20/14 2027)  sodium chloride 0.9 % bolus 1,000 mL (1,000 mLs Intravenous New Bag/Given 10/20/14 1937)    New Prescriptions   No medications on file   Filed Vitals:   10/20/14 1924 10/20/14 1959 10/20/14 2000  BP: 117/35  102/33  Pulse: 118  116  Temp: 97.8 F (36.6 C)    TempSrc: Rectal    Resp: 28  20  Height:  5\' 7"  (1.702 m)   Weight:  145 lb (65.772 kg)   SpO2: 100%  100%    MDM  Patient typically takes 7 of Levemir and 3  of NovoLog. Recently discharged from hospital on [id number] insulin complications. Seems this is a reccuring issue for this pt. Attempted to contact patient's husband on home phone and cell phone without success.  On arrival , CBG 546 with Kussmaul respirations. Initiated  glucose stabilizer in ED. ABG shows ph of 6.9, anion gap of at least 28. EKG shows sinus tach with LBBB. No vomiting in ED. Benign abd exam. Dry MM, receiving IVF. GCS 15  Patient appears improved since arrival. She is alert without Kussmaul respirations. Patient is steadily improving, CBG decreasing. Consult to internal medicine. Dr. Chancy Milroy to see in ED.  Prior to patient admission, I discussed and reviewed this case with attending, Dr.Linker    Final diagnoses:  Vomiting  SOB (shortness of breath)        Verl Dicker, PA-C 10/21/14 [place], MD 10/22/14 [phone]

## 2014-10-20 NOTE — ED Notes (Signed)
Attempted report 

## 2014-10-21 ENCOUNTER — Inpatient Hospital Stay (HOSPITAL_COMMUNITY): Payer: BC Managed Care – PPO

## 2014-10-21 DIAGNOSIS — E1310 Other specified diabetes mellitus with ketoacidosis without coma: Secondary | ICD-10-CM

## 2014-10-21 DIAGNOSIS — N179 Acute kidney failure, unspecified: Secondary | ICD-10-CM

## 2014-10-21 DIAGNOSIS — R4182 Altered mental status, unspecified: Secondary | ICD-10-CM

## 2014-10-21 LAB — BASIC METABOLIC PANEL
ANION GAP: 15 (ref 5–15)
ANION GAP: 15 (ref 5–15)
ANION GAP: 16 — AB (ref 5–15)
ANION GAP: 26 — AB (ref 5–15)
Anion gap: 13 (ref 5–15)
Anion gap: 12 (ref 5–15)
BUN: 18 mg/dL (ref 6–23)
BUN: 20 mg/dL (ref 6–23)
BUN: 20 mg/dL (ref 6–23)
BUN: 22 mg/dL (ref 6–23)
BUN: 23 mg/dL (ref 6–23)
BUN: 24 mg/dL — AB (ref 6–23)
CALCIUM: 8.9 mg/dL (ref 8.4–10.5)
CHLORIDE: 112 mmol/L (ref 96–112)
CO2: 11 mmol/L — ABNORMAL LOW (ref 19–32)
CO2: 11 mmol/L — ABNORMAL LOW (ref 19–32)
CO2: 12 mmol/L — ABNORMAL LOW (ref 19–32)
CO2: 13 mmol/L — AB (ref 19–32)
CO2: 6 mmol/L — CL (ref 19–32)
CO2: 7 mmol/L — CL (ref 19–32)
CREATININE: 2.77 mg/dL — AB (ref 0.50–1.10)
Calcium: 8.1 mg/dL — ABNORMAL LOW (ref 8.4–10.5)
Calcium: 7.8 mg/dL — ABNORMAL LOW (ref 8.4–10.5)
Calcium: 8.1 mg/dL — ABNORMAL LOW (ref 8.4–10.5)
Calcium: 8.1 mg/dL — ABNORMAL LOW (ref 8.4–10.5)
Calcium: 7.8 mg/dL — ABNORMAL LOW (ref 8.4–10.5)
Chloride: 110 mmol/L (ref 96–112)
Chloride: 117 mmol/L — ABNORMAL HIGH (ref 96–112)
Chloride: 110 mmol/L (ref 96–112)
Chloride: 112 mmol/L (ref 96–112)
Chloride: 112 mmol/L (ref 96–112)
Creatinine, Ser: 2.5 mg/dL — ABNORMAL HIGH (ref 0.50–1.10)
Creatinine, Ser: 2.62 mg/dL — ABNORMAL HIGH (ref 0.50–1.10)
Creatinine, Ser: 2.48 mg/dL — ABNORMAL HIGH (ref 0.50–1.10)
Creatinine, Ser: 2.6 mg/dL — ABNORMAL HIGH (ref 0.50–1.10)
Creatinine, Ser: 2.7 mg/dL — ABNORMAL HIGH (ref 0.50–1.10)
GFR calc Af Amer: 20 mL/min — ABNORMAL LOW (ref >90)
GFR calc Af Amer: 21 mL/min — ABNORMAL LOW (ref >90)
GFR calc Af Amer: 21 mL/min — ABNORMAL LOW (ref >90)
GFR calc non Af Amer: 17 mL/min — ABNORMAL LOW (ref >90)
GFR calc non Af Amer: 19 mL/min — ABNORMAL LOW (ref >90)
GFR calc non Af Amer: 18 mL/min — ABNORMAL LOW (ref >90)
GFR calc non Af Amer: 18 mL/min — ABNORMAL LOW (ref >90)
GFR calc non Af Amer: 18 mL/min — ABNORMAL LOW (ref >90)
GFR, EST AFRICAN AMERICAN: 22 mL/min — AB (ref >90)
GFR, EST AFRICAN AMERICAN: 23 mL/min — AB (ref >90)
GFR, EST AFRICAN AMERICAN: 20 mL/min — AB (ref >90)
GFR, EST NON AFRICAN AMERICAN: 19 mL/min — AB (ref >90)
Glucose, Bld: 240 mg/dL — ABNORMAL HIGH (ref 70–99)
Glucose, Bld: 81 mg/dL (ref 70–99)
Glucose, Bld: 312 mg/dL — ABNORMAL HIGH (ref 70–99)
Glucose, Bld: 170 mg/dL — ABNORMAL HIGH (ref 70–99)
Glucose, Bld: 143 mg/dL — ABNORMAL HIGH (ref 70–99)
Glucose, Bld: 198 mg/dL — ABNORMAL HIGH (ref 70–99)
POTASSIUM: 3.2 mmol/L — AB (ref 3.5–5.1)
POTASSIUM: 2.9 mmol/L — AB (ref 3.5–5.1)
POTASSIUM: 3.4 mmol/L — AB (ref 3.5–5.1)
POTASSIUM: 4.5 mmol/L (ref 3.5–5.1)
Potassium: 3.3 mmol/L — ABNORMAL LOW (ref 3.5–5.1)
Potassium: 5.7 mmol/L — ABNORMAL HIGH (ref 3.5–5.1)
SODIUM: 135 mmol/L (ref 135–145)
SODIUM: 138 mmol/L (ref 135–145)
SODIUM: 142 mmol/L (ref 135–145)
Sodium: 140 mmol/L (ref 135–145)
Sodium: 137 mmol/L (ref 135–145)
Sodium: 138 mmol/L (ref 135–145)

## 2014-10-21 LAB — GLUCOSE, CAPILLARY
GLUCOSE-CAPILLARY: 145 mg/dL — AB (ref 70–99)
GLUCOSE-CAPILLARY: 92 mg/dL (ref 70–99)
GLUCOSE-CAPILLARY: 111 mg/dL — AB (ref 70–99)
GLUCOSE-CAPILLARY: 237 mg/dL — AB (ref 70–99)
GLUCOSE-CAPILLARY: 295 mg/dL — AB (ref 70–99)
GLUCOSE-CAPILLARY: 151 mg/dL — AB (ref 70–99)
GLUCOSE-CAPILLARY: 181 mg/dL — AB (ref 70–99)
GLUCOSE-CAPILLARY: 258 mg/dL — AB (ref 70–99)
GLUCOSE-CAPILLARY: 116 mg/dL — AB (ref 70–99)
Glucose-Capillary: 100 mg/dL — ABNORMAL HIGH (ref 70–99)
Glucose-Capillary: 109 mg/dL — ABNORMAL HIGH (ref 70–99)
Glucose-Capillary: 110 mg/dL — ABNORMAL HIGH (ref 70–99)
Glucose-Capillary: 126 mg/dL — ABNORMAL HIGH (ref 70–99)
Glucose-Capillary: 143 mg/dL — ABNORMAL HIGH (ref 70–99)
Glucose-Capillary: 151 mg/dL — ABNORMAL HIGH (ref 70–99)
Glucose-Capillary: 181 mg/dL — ABNORMAL HIGH (ref 70–99)
Glucose-Capillary: 195 mg/dL — ABNORMAL HIGH (ref 70–99)
Glucose-Capillary: 204 mg/dL — ABNORMAL HIGH (ref 70–99)
Glucose-Capillary: 230 mg/dL — ABNORMAL HIGH (ref 70–99)
Glucose-Capillary: 246 mg/dL — ABNORMAL HIGH (ref 70–99)
Glucose-Capillary: 321 mg/dL — ABNORMAL HIGH (ref 70–99)
Glucose-Capillary: 92 mg/dL (ref 70–99)

## 2014-10-21 LAB — URINALYSIS, ROUTINE W REFLEX MICROSCOPIC
BILIRUBIN URINE: NEGATIVE
GLUCOSE, UA: 500 mg/dL — AB
KETONES UR: 15 mg/dL — AB
Nitrite: NEGATIVE
Protein, ur: 30 mg/dL — AB
SPECIFIC GRAVITY, URINE: 1.011 (ref 1.005–1.030)
Urobilinogen, UA: 0.2 mg/dL (ref 0.0–1.0)
pH: 5 (ref 5.0–8.0)

## 2014-10-21 LAB — PHOSPHORUS: PHOSPHORUS: 6.2 mg/dL — AB (ref 2.3–4.6)

## 2014-10-21 LAB — MAGNESIUM: MAGNESIUM: 2 mg/dL (ref 1.5–2.5)

## 2014-10-21 LAB — MRSA PCR SCREENING: MRSA by PCR: NEGATIVE

## 2014-10-21 LAB — URINE MICROSCOPIC-ADD ON

## 2014-10-21 LAB — POTASSIUM: Potassium: 3.7 mmol/L (ref 3.5–5.1)

## 2014-10-21 MED ORDER — METOCLOPRAMIDE HCL 5 MG/ML IJ SOLN
10.0000 mg | Freq: Three times a day (TID) | INTRAMUSCULAR | Status: DC
  Administered 2014-10-21: 10 mg via INTRAVENOUS
  Filled 2014-10-21 (×4): qty 2

## 2014-10-21 MED ORDER — POTASSIUM CHLORIDE 10 MEQ/100ML IV SOLN
10.0000 meq | INTRAVENOUS | Status: DC
  Administered 2014-10-21 (×2): 10 meq via INTRAVENOUS
  Filled 2014-10-21 (×3): qty 100

## 2014-10-21 MED ORDER — POTASSIUM CHLORIDE 10 MEQ/100ML IV SOLN
10.0000 meq | INTRAVENOUS | Status: AC
  Administered 2014-10-21 (×3): 10 meq via INTRAVENOUS
  Filled 2014-10-21 (×3): qty 100

## 2014-10-21 MED ORDER — SODIUM CHLORIDE 0.9 % IV BOLUS (SEPSIS)
1000.0000 mL | Freq: Once | INTRAVENOUS | Status: AC
  Administered 2014-10-21: 1000 mL via INTRAVENOUS

## 2014-10-21 MED ORDER — POTASSIUM CHLORIDE 10 MEQ/100ML IV SOLN: 10.0000 meq | INTRAVENOUS | Status: DC

## 2014-10-21 MED ORDER — ONDANSETRON HCL 4 MG/2ML IJ SOLN
4.0000 mg | Freq: Four times a day (QID) | INTRAMUSCULAR | Status: DC | PRN
  Administered 2014-10-22: 4 mg via INTRAVENOUS
  Filled 2014-10-21: qty 2

## 2014-10-21 MED ORDER — CARVEDILOL 6.25 MG PO TABS
6.2500 mg | ORAL_TABLET | Freq: Two times a day (BID) | ORAL | Status: DC
  Filled 2014-10-21 (×2): qty 1

## 2014-10-21 MED ORDER — ASPIRIN EC 325 MG PO TBEC
325.0000 mg | DELAYED_RELEASE_TABLET | Freq: Every day | ORAL | Status: DC
  Administered 2014-10-21 – 2014-10-25 (×5): 325 mg via ORAL
  Filled 2014-10-21 (×5): qty 1

## 2014-10-21 MED ORDER — DEXTROSE 5 % AND 0.45 % NACL IV BOLUS
1000.0000 mL | Freq: Once | INTRAVENOUS | Status: AC
  Administered 2014-10-21: 1000 mL via INTRAVENOUS

## 2014-10-21 MED ORDER — DEXTROSE-NACL 5-0.45 % IV SOLN
INTRAVENOUS | Status: DC
  Administered 2014-10-21: 03:00:00 via INTRAVENOUS
  Administered 2014-10-21: 125 mL/h via INTRAVENOUS
  Administered 2014-10-21: 07:00:00 via INTRAVENOUS

## 2014-10-21 MED ORDER — SODIUM CHLORIDE 0.9 % IV SOLN: INTRAVENOUS | Status: DC

## 2014-10-21 MED ORDER — POTASSIUM CHLORIDE 10 MEQ/100ML IV SOLN
10.0000 meq | INTRAVENOUS | Status: AC
  Administered 2014-10-21 (×2): 10 meq via INTRAVENOUS
  Filled 2014-10-21 (×2): qty 100

## 2014-10-21 MED ORDER — SODIUM BICARBONATE 8.4 % IV SOLN
INTRAVENOUS | Status: DC
  Administered 2014-10-21 – 2014-10-23 (×4): via INTRAVENOUS
  Filled 2014-10-21 (×5): qty 1000

## 2014-10-21 MED ORDER — PANTOPRAZOLE SODIUM 40 MG IV SOLR
40.0000 mg | INTRAVENOUS | Status: DC
  Administered 2014-10-21: 40 mg via INTRAVENOUS
  Filled 2014-10-21 (×2): qty 40

## 2014-10-21 MED ORDER — METOCLOPRAMIDE HCL 5 MG/ML IJ SOLN
10.0000 mg | Freq: Three times a day (TID) | INTRAMUSCULAR | Status: DC
  Administered 2014-10-21 – 2014-10-25 (×11): 10 mg via INTRAVENOUS
  Filled 2014-10-21 (×15): qty 2

## 2014-10-21 MED ORDER — LEVOTHYROXINE SODIUM 100 MCG IV SOLR
62.0000 ug | Freq: Every day | INTRAVENOUS | Status: DC
  Administered 2014-10-21 – 2014-10-22 (×2): 62 ug via INTRAVENOUS
  Filled 2014-10-21 (×3): qty 5

## 2014-10-21 NOTE — Progress Notes (Signed)
PATIENT DETAILS Name: Bridget Martinez Age: 64 y.o. Sex: female Date of Birth: 06-02-1951 Admit Date: 10/20/2014 Admitting Physician Allyne Gee, MD PO:[id number] MARGARET, FNP  Subjective: Awake, alert. Still nauseous. Claims she had vomiting for the past 2 days. Claims that she was taking her insulin as prescribed.  Assessment/Plan: Active Problems:   Diabetic ketoacidosis: Admitted with DKA with severe metabolic acidosis. Started on IV fluids and IV insulin. Bicarbonate now slowly increasing, continue with insulin drip to anion gap has closed. Will slowly need to minimize fluids given history of chronic systolic heart failure.    Acute encephalopathy: Likely metabolic encephalopathy secondary to acute on chronic and he disease and severe DKA. Mental status significantly improved. I will discontinue Rocephin, no UA (have ordered)-no indication of any infection at present. Will continue to follow.    Uncontrolled diabetes: Last A1c in 09/19/14 was 11.3. Once out of DKA, will transition back to Levemir and NovoLog.    Acute on chronic disease stage III: ARF likely secondary to pre-renal azotemia from persistent vomiting. Cautiously hydrate, recheck electrolytes. Check UA and renal ultrasound. Strict intake output, and follow closely. Avoid nephrotoxic agents.    Leukocytosis: Empirically started on Rocephin, however no indication of any infection. UA still pending. Chest x-ray without any pneumonia. I will discontinue Rocephin    Vomiting: Likely secondary to underlying gastroparesis. Abdomen is soft. Abdominal x-ray on 2/12 negative for acute abnormalities. Keep nothing by mouth, change Reglan to IV. Continue to treat underlying diabetic ketoacidosis and follow.    Chronic systolic heart failure: Currently clinically compensated, last echocardiogram on 07/26/14 showed EF around 30-35%. Closely monitor while on IV fluids.    Hyperlipidemia: Continue with statins   Hypertension: Continue with Coreg  Disposition: Remain inpatient  Antibiotics:  See below   Anti-infectives    Start     Dose/Rate Route Frequency Ordered Stop   10/21/14 0000  cefTRIAXone (ROCEPHIN) 1 g in dextrose 5 % 50 mL IVPB - Premix     1 g 100 mL/hr over 30 Minutes Intravenous Every 24 hours 10/20/14 2345        DVT Prophylaxis: Prophylactic Heparin  Code Status: Full code   Family Communication None at bedside  Procedures:  None  CONSULTS:  None  Time spent 40 minutes-which includes 50% of the time with face-to-face with patient/ family and coordinating care related to the above assessment and plan.  MEDICATIONS: Scheduled Meds: . aspirin EC  81 mg Oral Daily  . carvedilol  6.25 mg Oral BID WC  . cefTRIAXone (ROCEPHIN)  IV  1 g Intravenous Q24H  . clopidogrel  75 mg Oral Q breakfast  . escitalopram  10 mg Oral Daily  . heparin  5,000 Units Subcutaneous 3 times per day  . levothyroxine  62 mcg Intravenous QAC breakfast  . metoCLOPramide (REGLAN) injection  10 mg Intravenous 3 times per day  . pantoprazole (PROTONIX) IV  40 mg Intravenous Q24H  . potassium chloride  10 mEq Intravenous Q1 Hr x 2   Continuous Infusions: . dextrose 5 % and 0.45% NaCl 125 mL/hr at 10/21/14 0815  . insulin (NOVOLIN-R) infusion 2.9 Units/hr (10/21/14 0915)   PRN Meds:.hyoscyamine, ondansetron (ZOFRAN) IV    PHYSICAL EXAM: Vital signs in last 24 hours: Filed Vitals:   10/21/14 0345 10/21/14 0400 10/21/14 0500 10/21/14 0600  BP:  108/32 106/46 123/47  Pulse: 119 96 95 110  Temp:  98.9 F (37.2 C)  TempSrc:  Oral    Resp: 16 22 14 20   Height:      Weight:      SpO2: 100% 100% 100% 100%    Weight change:  Filed Weights   10/20/14 1959  Weight: 65.772 kg (145 lb)   Body mass index is 22.71 kg/(m^2).   Gen Exam: Awake and alert with clear speech.   Neck: Supple, No JVD.   Chest: B/L Clear.   CVS: S1 S2 Regular, no murmurs.  Abdomen: soft, BS +, non  tender, non distended.  Extremities: no edema, lower extremities warm to touch. Neurologic: Non Focal.   Skin: No Rash.   Wounds: N/A.    Intake/Output from previous day:  Intake/Output Summary (Last 24 hours) at 10/21/14 1124 Last data filed at 10/21/14 0820  Gross per 24 hour  Intake 2803.84 ml  Output      0 ml  Net 2803.84 ml     LAB RESULTS: CBC  Recent Labs Lab 10/20/14 1951 10/20/14 2048  WBC 18.8*  --   HGB 9.9* 12.2  HCT 31.6* 36.0  PLT 312  --   MCV 96.3  --   MCH 30.2  --   MCHC 31.3  --   RDW 14.9  --   LYMPHSABS 1.1  --   MONOABS 1.5*  --   EOSABS 0.0  --   BASOSABS 0.0  --     Chemistries   Recent Labs Lab 10/20/14 1951 10/20/14 2048 10/21/14 0050 10/21/14 0434 10/21/14 0628 10/21/14 0824  NA 135 131* 142 140  --  137  K 4.8 5.5* 3.3* 5.7* 3.7 3.2*  CL 102 104 110 117*  --  110  CO2 <5*  --  6* 7*  --  12*  GLUCOSE 597* 613* 240* 81  --  312*  BUN 23 30* 23 22  --  24*  CREATININE 3.01* 1.70* 2.77* 2.50*  --  2.62*  CALCIUM 8.3*  --  8.9 8.1*  --  7.8*  MG  --   --  2.0  --   --   --     CBG:  Recent Labs Lab 10/21/14 0305 10/21/14 0428 10/21/14 0530 10/21/14 0640 10/21/14 0807  GLUCAP 109* 92 111* 237* 295*    GFR Estimated Creatinine Clearance: 21.1 mL/min (by C-G formula based on Cr of 2.62).  Coagulation profile No results for input(s): INR, PROTIME in the last 168 hours.  Cardiac Enzymes No results for input(s): CKMB, TROPONINI, MYOGLOBIN in the last 168 hours.  Invalid input(s): CK  Invalid input(s): POCBNP No results for input(s): DDIMER in the last 72 hours. No results for input(s): HGBA1C in the last 72 hours. No results for input(s): CHOL, HDL, LDLCALC, TRIG, CHOLHDL, LDLDIRECT in the last 72 hours. No results for input(s): TSH, T4TOTAL, T3FREE, THYROIDAB in the last 72 hours.  Invalid input(s): FREET3 No results for input(s): VITAMINB12, FOLATE, FERRITIN, TIBC, IRON, RETICCTPCT in the last 72  hours.  Recent Labs  10/20/14 1951  LIPASE 22    Urine Studies No results for input(s): UHGB, CRYS in the last 72 hours.  Invalid input(s): UACOL, UAPR, USPG, UPH, UTP, UGL, UKET, UBIL, UNIT, UROB, ULEU, UEPI, UWBC, URBC, UBAC, CAST, UCOM, BILUA  MICROBIOLOGY: Recent Results (from the past 240 hour(s))  MRSA PCR Screening     Status: None   Collection Time: 10/20/14 11:26 PM  Result Value Ref Range Status   MRSA by PCR NEGATIVE NEGATIVE Final    Comment:  The GeneXpert MRSA Assay (FDA approved for NASAL specimens only), is one component of a comprehensive MRSA colonization surveillance program. It is not intended to diagnose MRSA infection nor to guide or monitor treatment for MRSA infections.     RADIOLOGY STUDIES/RESULTS: Dg Chest Port 1 View  10/21/2014   CLINICAL DATA:  Shortness of breath.  EXAM: PORTABLE CHEST - 1 VIEW  COMPARISON:  September 18, 2014.  FINDINGS: The heart size and mediastinal contours are within normal limits. No pneumothorax or pleural effusion is noted. Status post Coronary artery bypass graft. No acute pulmonary disease is noted. The visualized skeletal structures are unremarkable.  IMPRESSION: No acute cardiopulmonary abnormality seen.   Electronically Signed   By: Marijo Conception, M.D.   On: 10/21/2014 08:55   Dg Abd Portable 2v  10/21/2014   CLINICAL DATA:  Vomiting.  EXAM: PORTABLE ABDOMEN - 2 VIEW  COMPARISON:  July 27, 2014.  FINDINGS: The bowel gas pattern is normal. There is no evidence of free air. No radio-opaque calculi or other significant radiographic abnormality is seen. Stool is noted in the rectum.  IMPRESSION: No evidence of bowel obstruction or ileus.   Electronically Signed   By: Marijo Conception, M.D.   On: 10/21/2014 08:52    Oren Binet, MD  [facility] Pager:[phone]  If 7PM-7AM, please contact night-coverage [email or web address] Password [email or web address] 10/21/2014, 11:24 AM   LOS: 1 day

## 2014-10-21 NOTE — Progress Notes (Signed)
Utilization Review Completed.  

## 2014-10-21 NOTE — Clinical Documentation Improvement (Signed)
PLEASE SPECIFY TYPE AND ACUITY OF CHF: Possible Clinical Conditions?  Chronic Systolic Congestive Heart Failure Chronic Diastolic Congestive Heart Failure Chronic Systolic & Diastolic Congestive Heart Failure Acute Systolic Congestive Heart Failure Acute Diastolic Congestive Heart Failure Acute Systolic & Diastolic Congestive Heart Failure Acute on Chronic Systolic Congestive Heart Failure Acute on Chronic Diastolic Congestive Heart Failure Acute on Chronic Systolic & Diastolic Congestive Heart Failure Other Condition Cannot Clinically Determine  Supporting Information: CHF is on patients problem List on H&P  Thank You, Alessandra Grout, RN, BSN, CCDS,Clinical Documentation Specialist:  [phone]  [phone]=[facility] [place]- Health Information Management

## 2014-10-22 ENCOUNTER — Inpatient Hospital Stay (HOSPITAL_COMMUNITY): Payer: BC Managed Care – PPO

## 2014-10-22 DIAGNOSIS — E0810 Diabetes mellitus due to underlying condition with ketoacidosis without coma: Secondary | ICD-10-CM

## 2014-10-22 LAB — BASIC METABOLIC PANEL
Anion gap: 13 (ref 5–15)
Anion gap: 15 (ref 5–15)
BUN: 15 mg/dL (ref 6–23)
BUN: 18 mg/dL (ref 6–23)
CALCIUM: 7.8 mg/dL — AB (ref 8.4–10.5)
CHLORIDE: 106 mmol/L (ref 96–112)
CO2: 14 mmol/L — ABNORMAL LOW (ref 19–32)
CO2: 14 mmol/L — ABNORMAL LOW (ref 19–32)
Calcium: 7.7 mg/dL — ABNORMAL LOW (ref 8.4–10.5)
Chloride: 109 mmol/L (ref 96–112)
Creatinine, Ser: 2.71 mg/dL — ABNORMAL HIGH (ref 0.50–1.10)
Creatinine, Ser: 3.01 mg/dL — ABNORMAL HIGH (ref 0.50–1.10)
GFR calc Af Amer: 18 mL/min — ABNORMAL LOW (ref >90)
GFR calc non Af Amer: 15 mL/min — ABNORMAL LOW (ref >90)
GFR, EST AFRICAN AMERICAN: 20 mL/min — AB (ref >90)
GFR, EST NON AFRICAN AMERICAN: 17 mL/min — AB (ref >90)
GLUCOSE: 194 mg/dL — AB (ref 70–99)
GLUCOSE: 314 mg/dL — AB (ref 70–99)
POTASSIUM: 4.6 mmol/L (ref 3.5–5.1)
POTASSIUM: 3.5 mmol/L (ref 3.5–5.1)
SODIUM: 135 mmol/L (ref 135–145)
Sodium: 136 mmol/L (ref 135–145)

## 2014-10-22 LAB — CBC
HEMATOCRIT: 26.2 % — AB (ref 36.0–46.0)
HEMOGLOBIN: 9 g/dL — AB (ref 12.0–15.0)
MCH: 29.6 pg (ref 26.0–34.0)
MCHC: 34.4 g/dL (ref 30.0–36.0)
MCV: 86.2 fL (ref 78.0–100.0)
PLATELETS: 210 10*3/uL (ref 150–400)
RBC: 3.04 MIL/uL — ABNORMAL LOW (ref 3.87–5.11)
RDW: 14.8 % (ref 11.5–15.5)
WBC: 8.6 10*3/uL (ref 4.0–10.5)

## 2014-10-22 LAB — GLUCOSE, CAPILLARY
GLUCOSE-CAPILLARY: 187 mg/dL — AB (ref 70–99)
GLUCOSE-CAPILLARY: 162 mg/dL — AB (ref 70–99)
GLUCOSE-CAPILLARY: 160 mg/dL — AB (ref 70–99)
GLUCOSE-CAPILLARY: 69 mg/dL — AB (ref 70–99)
Glucose-Capillary: 121 mg/dL — ABNORMAL HIGH (ref 70–99)
Glucose-Capillary: 148 mg/dL — ABNORMAL HIGH (ref 70–99)
Glucose-Capillary: 166 mg/dL — ABNORMAL HIGH (ref 70–99)
Glucose-Capillary: 302 mg/dL — ABNORMAL HIGH (ref 70–99)
Glucose-Capillary: 282 mg/dL — ABNORMAL HIGH (ref 70–99)
Glucose-Capillary: 164 mg/dL — ABNORMAL HIGH (ref 70–99)
Glucose-Capillary: 172 mg/dL — ABNORMAL HIGH (ref 70–99)
Glucose-Capillary: 126 mg/dL — ABNORMAL HIGH (ref 70–99)

## 2014-10-22 LAB — SODIUM, URINE, RANDOM
Sodium, Ur: 73 mmol/L
Sodium, Ur: 67 mmol/L

## 2014-10-22 LAB — HEMOGLOBIN A1C
Hgb A1c MFr Bld: 10.5 % — ABNORMAL HIGH (ref 4.8–5.6)
Mean Plasma Glucose: 255 mg/dL

## 2014-10-22 LAB — CREATININE, URINE, RANDOM: CREATININE, URINE: 23.11 mg/dL

## 2014-10-22 MED ORDER — ESCITALOPRAM OXALATE 10 MG PO TABS
10.0000 mg | ORAL_TABLET | Freq: Every day | ORAL | Status: DC
  Administered 2014-10-22 – 2014-10-25 (×4): 10 mg via ORAL
  Filled 2014-10-22 (×4): qty 1

## 2014-10-22 MED ORDER — PANTOPRAZOLE SODIUM 40 MG PO TBEC
40.0000 mg | DELAYED_RELEASE_TABLET | Freq: Every day | ORAL | Status: DC
  Administered 2014-10-22: 40 mg via ORAL
  Filled 2014-10-22: qty 1

## 2014-10-22 MED ORDER — ROSUVASTATIN CALCIUM 40 MG PO TABS
40.0000 mg | ORAL_TABLET | Freq: Every day | ORAL | Status: DC
  Administered 2014-10-22 – 2014-10-25 (×4): 40 mg via ORAL
  Filled 2014-10-22 (×4): qty 1

## 2014-10-22 MED ORDER — LEVOTHYROXINE SODIUM 125 MCG PO TABS
125.0000 ug | ORAL_TABLET | Freq: Every day | ORAL | Status: DC
  Administered 2014-10-23 – 2014-10-25 (×3): 125 ug via ORAL
  Filled 2014-10-22 (×4): qty 1

## 2014-10-22 MED ORDER — DEXTROSE 50 % IV SOLN
INTRAVENOUS | Status: AC
  Administered 2014-10-22: 25 mL
  Filled 2014-10-22: qty 50

## 2014-10-22 MED ORDER — POLYETHYLENE GLYCOL 3350 17 G PO PACK
17.0000 g | PACK | Freq: Two times a day (BID) | ORAL | Status: DC
  Administered 2014-10-22 – 2014-10-25 (×5): 17 g via ORAL
  Filled 2014-10-22 (×8): qty 1

## 2014-10-22 MED ORDER — CARVEDILOL 6.25 MG PO TABS
6.2500 mg | ORAL_TABLET | Freq: Two times a day (BID) | ORAL | Status: DC
  Administered 2014-10-22: 6.25 mg via ORAL
  Filled 2014-10-22 (×4): qty 1

## 2014-10-22 MED ORDER — INSULIN DETEMIR 100 UNIT/ML ~~LOC~~ SOLN
10.0000 [IU] | Freq: Two times a day (BID) | SUBCUTANEOUS | Status: DC
  Administered 2014-10-22: 10 [IU] via SUBCUTANEOUS
  Filled 2014-10-22 (×2): qty 0.1

## 2014-10-22 MED ORDER — INSULIN DETEMIR 100 UNIT/ML ~~LOC~~ SOLN
10.0000 [IU] | Freq: Every day | SUBCUTANEOUS | Status: DC
  Administered 2014-10-23 – 2014-10-24 (×2): 10 [IU] via SUBCUTANEOUS
  Filled 2014-10-22 (×2): qty 0.1

## 2014-10-22 MED ORDER — INSULIN ASPART 100 UNIT/ML ~~LOC~~ SOLN
0.0000 [IU] | Freq: Three times a day (TID) | SUBCUTANEOUS | Status: DC
  Administered 2014-10-22: 2 [IU] via SUBCUTANEOUS
  Administered 2014-10-23: 9 [IU] via SUBCUTANEOUS
  Administered 2014-10-23 – 2014-10-24 (×2): 2 [IU] via SUBCUTANEOUS
  Administered 2014-10-24: 1 [IU] via SUBCUTANEOUS
  Administered 2014-10-25: 2 [IU] via SUBCUTANEOUS
  Administered 2014-10-25: 9 [IU] via SUBCUTANEOUS

## 2014-10-22 NOTE — Consult Note (Signed)
Renal Service Consult Note [facility]  Bridget Martinez 10/22/2014 Roney Jaffe D Requesting Physician:  Dr Sloan Leiter  Reason for Consult:  Acute renal failure HPI: The patient is a 64 y.o. year-old with hx of DM type 1, HTN, recurrent DKA, hx CAD w CABG 2013 who presented with high BS's. In ED was tachycardic, altered mental , severe met acidosis. Admitted and treated for DKA w IV insulin.  Creat has gone up from 1.7 on admit to 3.01 today with BUN 18.  K 3.5, CO2 14 and AG 15.  Patient up in chair, denies n/v/ abd pain.    IP Meds > IV insulin, statin, KCl, miralax, PPI, Reglan, levothyroxine, Levemir, novolog, SQ heparin, Lexapro, clopidogrel, ceftriaxone, carvedilol, ASA , Fosamax    Date    Creat   2012    0.80- 2.15 2013   0.75- 1.55 2014   0.81- 1.72 2015   0.87- 1.50  Jan 7 - 11,2016 2.16 > 0.88    Chart review: 11/12 - 40 yo w DM, HTN presented with nausea and high BS > found to be in DKA, +NSTEMI, went for heart cath which showed diffuse 3V disease. Cardiac MRI showed viability. Needs optimize medical Rx prior to doing CABG. DC'd to rehab. Also UTI, AKI due to ATN, HTN, HL, venous stasis ulcers, depression, DM 1/13 - CABG by [facility] in 09/27/11, chronic syst HF EF 20-25%, ICM, low K, UTI 3/13 - DKA, UTI, met acidosis 12/14 - N/V, lethargy > DKA w severe acidosis, septic shock, acute resp failure, UTI, NSTEMI 2/15 - N/V, AMS > DKA treated w IV insulin, acute syst CHF rx with Lasix IV. Acute on CRF, baseline ~1.5 3/15 - N/V, BS 650 > rx with DKA protocol, IV insulin.  3/15 - hypoglycemic episode 11/15 - severe DKA, acute resp failure, HTN, DM1, CKD III, CSHF, SIRS 1/7 - 09/21/14 > DKA, left thigh abcess, SIRS, syst/diast HF, CKD III, diab gastroparesis, DM 1, hypoT4 1/30 - 10/09/14 > hypoglycemic e  Past Medical History  Past Medical History  Diagnosis Date  . Diabetes mellitus     on insulin, with h/o DKA   . HTN (hypertension)   . Hyperlipidemia   .  Venous stasis ulcers   . Arthritis   . Depression     anxiety  . 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 Hypertension   . Coronary artery disease     angina.  MI.   . Pneumonia        . GERD (gastroesophageal reflux disease)     Hiatal hernia.   . Hypothyroidism   . CHF (congestive heart failure)   . Tuberculosis   . Osteoporosis    Past Surgical History  Past Surgical History  Procedure Laterality Date  . Cardiac catheterization    . Tonsillectomy    . Coronary artery bypass graft      Dr. Prescott Gum in 09/2011   . Coronary artery bypass graft  2012  . Esophagogastroduodenoscopy N/A 11/17/2013    Procedure: ESOPHAGOGASTRODUODENOSCOPY (EGD);  Surgeon: Lafayette Dragon, MD;  Location: [facility] ENDOSCOPY;  Service: Endoscopy;  Laterality: N/A;  . Colonoscopy N/A 11/18/2013    Procedure: COLONOSCOPY;  Surgeon: Lafayette Dragon, MD;  Location[facility] ENDOSCOPY;  Service: Endoscopy;  Laterality: N/A;  . Right heart catheterization N/A 07/29/2011    Procedure: RIGHT HEART CATH;  Surgeon: Minus Breeding, MD;  Location[facility] CATH LAB;  Service: Cardiovascular;  Laterality: N/A;  . Left heart catheterization with coronary angiogram N/A 07/26/2011    Procedure: LEFT HEART CATHETERIZATION WITH CORONARY ANGIOGRAM;  Surgeon: Larey Dresser, MD;  Location: [facility] CATH LAB;  Service: Cardiovascular;  Laterality: N/A;   Family History  Family History  Problem Relation Age of Onset  . Heart disease Father   . Multiple sclerosis Father   . Hypertension Mother   . Hyperthyroidism Mother   . Diabetes Cousin     Multiple maternal cousins with type 2 diabetes mellitus  . Diabetes Maternal Uncle     Type 1 diabetes mellitus  . Heart attack Paternal Grandfather   . Heart disease Paternal Grandfather    Social History  reports that she has never smoked. She does not have any smokeless tobacco history on file. She reports that she does not drink alcohol or use illicit  drugs. Allergies  Allergies  Allergen Reactions  . Sulfa Antibiotics Anaphylaxis and Swelling    Stiff neck also    Home medications Prior to Admission medications   Medication Sig Start Date End Date Taking? Authorizing Provider  aspirin 325 MG tablet Take 325 mg by mouth daily.   Yes Historical Provider, MD  carvedilol (COREG) 6.25 MG tablet Take 1 tablet (6.25 mg total) by mouth 2 (two) times daily with a meal. 10/18/14  Yes Jolaine Artist, MD  clopidogrel (PLAVIX) 75 MG tablet Take 1 tablet (75 mg total) by mouth daily with breakfast. 01/12/14  Yes Lysbeth Penner, FNP  furosemide (LASIX) 40 MG tablet Take 1 tablet (40 mg total) by mouth daily. 10/29/13  Yes Bobby Rumpf York, PA-C  insulin aspart (NOVOLOG) 100 UNIT/ML injection Inject 3 Units into the skin 3 (three) times daily with meals. Patient taking differently: Inject 9-20 Units into the skin 3 (three) times daily with meals. On sliding scale 09/21/14  Yes Nishant Dhungel, MD  levothyroxine (SYNTHROID, LEVOTHROID) 125 MCG tablet Take 1 tablet (125 mcg total) by mouth daily. 09/27/13  Yes Lysbeth Penner, FNP  metoCLOPramide (REGLAN) 10 MG tablet Take 1 tablet (10 mg total) by mouth 4 (four) times daily -  before meals and at bedtime. 10/09/14  Yes Debbe Odea, MD  ondansetron (ZOFRAN ODT) 8 MG disintegrating tablet Take 1 tablet (8 mg total) by mouth every 8 (eight) hours as needed for nausea or vomiting. 10/09/14  Yes Debbe Odea, MD  pantoprazole (PROTONIX) 40 MG tablet Take 1 tablet (40 mg total) by mouth daily. 07/31/14 03/30/17 Yes Ripudeep Krystal Eaton, MD  rosuvastatin (CRESTOR) 40 MG tablet Take 1 tablet (40 mg total) by mouth daily. 09/27/13  Yes Lysbeth Penner, FNP  spironolactone (ALDACTONE) 25 MG tablet Take 0.5 tablets (12.5 mg total) by mouth daily. 10/18/14  Yes Shaune Pascal [name], MD  TRESIBA FLEXTOUCH 100 UNIT/ML SOPN Inject 15 Units into the muscle every evening. 10/17/14  Yes Historical Provider, MD  Vitamin D, Ergocalciferol,  (DRISDOL) 50000 UNITS CAPS capsule Take 1 capsule by mouth every Saturday.  09/29/13  Yes Historical Provider, MD  aspirin EC 81 MG tablet Take 1 tablet (81 mg total) by mouth daily. Patient not taking: Reported on 10/21/2014 06/15/14   Rande Brunt, NP  escitalopram (LEXAPRO) 10 MG tablet Take 1 tablet (10 mg total) by mouth daily. Patient not taking: Reported on 10/21/2014 09/27/13   Lysbeth Penner, FNP  insulin detemir (LEVEMIR) 100 UNIT/ML injection Inject 0.07 mLs (7 Units total) into the skin at bedtime. Patient not taking: Reported on 10/21/2014  09/21/14   Nishant Dhungel, MD   Liver Function Tests  Recent Labs Lab 10/20/14 1951  AST 30  ALT 17  ALKPHOS 139*  BILITOT 2.1*  PROT 5.7*  ALBUMIN 2.8*    Recent Labs Lab 10/20/14 1951  LIPASE 22   CBC  Recent Labs Lab 10/20/14 1951 10/20/14 2048 10/22/14 0305  WBC 18.8*  --  8.6  NEUTROABS 16.2*  --   --   HGB 9.9* 12.2 9.0*  HCT 31.6* 36.0 26.2*  MCV 96.3  --  86.2  PLT 312  --  588   Basic Metabolic Panel  Recent Labs Lab 10/21/14 0050 10/21/14 0434 10/21/14 0628 10/21/14 0824 10/21/14 1105 10/21/14 1550 10/21/14 2010 10/22/14 0305 10/22/14 0615  NA 142 140  --  137 138 138 135 136 135  K 3.3* 5.7* 3.7 3.2* 2.9* 3.4* 4.5 4.6 3.5  CL 110 117*  --  110 112 112 112 109 106  CO2 6* 7*  --  12* 13* 11* 11* 14* 14*  GLUCOSE 240* 81  --  312* 170* 143* 198* 194* 314*  BUN 23 22  --  24* _0 CREATININE 2.77* 2.50*  --  2.62* 2.48* 2.60* 2.70* 2.71* 3.01*  CALCIUM 8.9 8.1*  --  7.8* 8.1* 8.1* 7.8* 7.8* 7.7*  PHOS 6.2*  --   --   --   --   --   --   --   --     Filed Vitals:   10/22/14 0012 10/22/14 0459 10/22/14 0750 10/22/14 1204  BP: 121/43 135/35 127/52 143/66  Pulse: 87 104 80 89  Temp: 98.3 F (36.8 C) 98.7 F (37.1 C) 97.5 F (36.4 C) 97.9 F (36.6 C)  TempSrc: Oral Oral Oral Oral  Resp: _1 Height:      Weight:      SpO2: 98% 98% 100% 100%   Exam Chronically  ill-appearing WF , up in chair, NAD No rash, cyanosis or gangrene Sclera anicteric, throat slightly dry No jvd Chest clear bilat RRR no MRG, sternotomy scar Abd soft, NTND, no ascites or masses No fem bruit 1+ nonpitting LE edema bilat Neuro is nf, ox 3  Renal US > 11-13 cm kidneys, on hydronephrosis.   UA 2/12 (lab) > 1.011, 5.0, 500 glu, 30 prot, mod LE/Hb 21-50wbc, 7-10rbc, rare epi, few bact, gran cast UA 2/13 (undersigned) > 1.010, 5.0, 1+LE, Hb, 1+protein, 1+, 20-30 WBC/ HPF, CCCC, no rbc's   Assessment: 1. Acute kidney injury - sediment c/w ATN, and probable UTI as well. Creat rising.  Will hold PPI, supportive care from here. Hopefully she'll recover renal function; however she has had many episodes of prior AKI at this facility and therefore she is at higher risk of prolonged or irreversible AKI with this. Getting IVF's at 100/ hr.  Urine lytes, will follow.    Plan- as above  Kelly Splinter MD (pgr) [phone]    [phone] 10/22/2014, 1:35 PM

## 2014-10-22 NOTE — Progress Notes (Signed)
10/22/2014  Patient foley cath was placed at 0930 she had clear yellow urine and she tolerate foley. Reason for foley cath per Dr Sloan Leiter Acute Renal failure. [facility] RN.

## 2014-10-22 NOTE — Progress Notes (Signed)
Hypoglycemic Event  CBG: 69  Treatment: D50 IV 25 mL  Symptoms: None  Follow-up CBG: Time:1715 CBG Result:126  Possible Reasons for Event: Inadequate meal intake  Comments/MD notified: Dr Jenell Milliner, Justice Rocher R  Remember to initiate Hypoglycemia Order Set & complete

## 2014-10-22 NOTE — Progress Notes (Addendum)
PATIENT DETAILS Name: Bridget Martinez Age: 64 y.o. Sex: female Date of Birth: October 02, 1950 Admit Date: 10/20/2014 Admitting Physician Allyne Gee, MD PO:[id number] MARGARET, FNP  Subjective: Feels much better, no nausea this am, tolerating clears.  Assessment/Plan: Active Problems:   Diabetic ketoacidosis: Admitted with DKA with severe metabolic acidosis. Started on IV fluids and IV insulin. Bicarbonate now slowly increasing, but now has plateaued, likely is from ARF. Stop IV insulin and transition to Levemir and SSI    Acute encephalopathy: Likely metabolic encephalopathy secondary to acute on chronic and he disease and severe DKA.Resolved    Uncontrolled diabetes: Last A1c in 09/19/14 was 11.3. Transitioned to Levemir and NovoLog.    Acute on chronic disease stage III: ARF likely secondary to pre-renal azotemia from persistent vomiting.Hydrated, but no further improvement in creatinine, Renal consulted. Renal ultrasound pending.      Metabolic Acidosis:from DKA and ARF. Anion gap has closed, have stopped IV Insulin. Now on IVF with bicarb. Await renal input     UTI:on Rocephin, await urine cs    Leukocytosis:Resolved, secondary to UTI.    Vomiting: Likely secondary to underlying gastroparesis. Abdomen is soft. Abdominal x-ray on 2/12 negative for acute abnormalities. Continue with Reglan. Much better, advance to full liquids    Chronic systolic heart failure: Currently clinically compensated, last echocardiogram on 07/26/14 showed EF around 30-35%. Closely monitor while on IV fluids.    Hyperlipidemia: Continue with statins    Hypertension: Continue with Coreg  Disposition: Remain inpatient  Antibiotics:  See below   Anti-infectives    Start     Dose/Rate Route Frequency Ordered Stop   10/21/14 0000  cefTRIAXone (ROCEPHIN) 1 g in dextrose 5 % 50 mL IVPB - Premix     1 g 100 mL/hr over 30 Minutes Intravenous Every 24 hours 10/20/14 2345        DVT  Prophylaxis: Prophylactic Heparin  Code Status: Full code   Family Communication None at bedside  Procedures:  None  CONSULTS:  None  Time spent 40 minutes-which includes 50% of the time with face-to-face with patient/ family and coordinating care related to the above assessment and plan.  MEDICATIONS: Scheduled Meds: . aspirin EC  325 mg Oral Daily  . cefTRIAXone (ROCEPHIN)  IV  1 g Intravenous Q24H  . clopidogrel  75 mg Oral Q breakfast  . escitalopram  10 mg Oral Daily  . heparin  5,000 Units Subcutaneous 3 times per day  . insulin aspart  0-9 Units Subcutaneous TID WC  . insulin detemir  10 Units Subcutaneous BID  . [START ON 10/23/2014] levothyroxine  125 mcg Oral QAC breakfast  . metoCLOPramide (REGLAN) injection  10 mg Intravenous TID AC  . pantoprazole  40 mg Oral Q1200  . polyethylene glycol  17 g Oral BID  . rosuvastatin  40 mg Oral Daily   Continuous Infusions: . dextrose 5 % 1,000 mL with potassium chloride 20 mEq, sodium bicarbonate 100 mEq infusion 75 mL/hr at 10/22/14 0830  . insulin (NOVOLIN-R) infusion 1 Units/hr (10/22/14 0823)   PRN Meds:.hyoscyamine, ondansetron (ZOFRAN) IV    PHYSICAL EXAM: Vital signs in last 24 hours: Filed Vitals:   10/21/14 1952 10/22/14 0012 10/22/14 0459 10/22/14 0750  BP: 125/44 121/43 135/35 127/52  Pulse: 87 87 104 80  Temp: 98.5 F (36.9 C) 98.3 F (36.8 C) 98.7 F (37.1 C) 97.5 F (36.4 C)  TempSrc: Oral Oral Oral Oral  Resp: 19 15  22 14  Height:      Weight:      SpO2: 100% 98% 98% 100%    Weight change:  Filed Weights   10/20/14 1959  Weight: 65.772 kg (145 lb)   Body mass index is 22.71 kg/(m^2).   Gen Exam: Awake and alert with clear speech.   Neck: Supple, No JVD.   Chest: B/L Clear.   CVS: S1 S2 Regular, no murmurs.  Abdomen: soft, BS +, non tender, non distended.  Extremities: no edema, lower extremities warm to touch. Neurologic: Non Focal.   Skin: No Rash.   Wounds: N/A.     Intake/Output from previous day:  Intake/Output Summary (Last 24 hours) at 10/22/14 0925 Last data filed at 10/22/14 [id number]  Gross per 24 hour  Intake 1816.67 ml  Output   1600 ml  Net 216.67 ml     LAB RESULTS: CBC  Recent Labs Lab 10/20/14 1951 10/20/14 2048 10/22/14 0305  WBC 18.8*  --  8.6  HGB 9.9* 12.2 9.0*  HCT 31.6* 36.0 26.2*  PLT 312  --  210  MCV 96.3  --  86.2  MCH 30.2  --  29.6  MCHC 31.3  --  34.4  RDW 14.9  --  14.8  LYMPHSABS 1.1  --   --   MONOABS 1.5*  --   --   EOSABS 0.0  --   --   BASOSABS 0.0  --   --     Chemistries   Recent Labs Lab 10/21/14 0050  10/21/14 0824 10/21/14 1105 10/21/14 1550 10/21/14 2010 10/22/14 0305  NA 142  < > 137 138 138 135 136  K 3.3*  < > 3.2* 2.9* 3.4* 4.5 4.6  CL 110  < > 110 112 112 112 109  CO2 6*  < > 12* 13* 11* 11* 14*  GLUCOSE 240*  < > 312* 170* 143* 198* 194*  BUN 23  < > 24* 20 18 20 15   CREATININE 2.77*  < > 2.62* 2.48* 2.60* 2.70* 2.71*  CALCIUM 8.9  < > 7.8* 8.1* 8.1* 7.8* 7.8*  MG 2.0  --   --   --   --   --   --   < > = values in this interval not displayed.  CBG:  Recent Labs Lab 10/22/14 0313 10/22/14 0501 10/22/14 0606 10/22/14 0717 10/22/14 0822  GLUCAP 148* 302* 282* 187* 162*    GFR Estimated Creatinine Clearance: 20.4 mL/min (by C-G formula based on Cr of 2.71).  Coagulation profile No results for input(s): INR, PROTIME in the last 168 hours.  Cardiac Enzymes No results for input(s): CKMB, TROPONINI, MYOGLOBIN in the last 168 hours.  Invalid input(s): CK  Invalid input(s): POCBNP No results for input(s): DDIMER in the last 72 hours.  Recent Labs  10/21/14 0050  HGBA1C 10.5*   No results for input(s): CHOL, HDL, LDLCALC, TRIG, CHOLHDL, LDLDIRECT in the last 72 hours. No results for input(s): TSH, T4TOTAL, T3FREE, THYROIDAB in the last 72 hours.  Invalid input(s): FREET3 No results for input(s): VITAMINB12, FOLATE, FERRITIN, TIBC, IRON, RETICCTPCT in the last  72 hours.  Recent Labs  10/20/14 1951  LIPASE 22    Urine Studies No results for input(s): UHGB, CRYS in the last 72 hours.  Invalid input(s): UACOL, UAPR, USPG, UPH, UTP, UGL, UKET, UBIL, UNIT, UROB, ULEU, UEPI, UWBC, URBC, UBAC, CAST, UCOM, BILUA  MICROBIOLOGY: Recent Results (from the past 240 hour(s))  MRSA PCR Screening  Status: None   Collection Time: 10/20/14 11:26 PM  Result Value Ref Range Status   MRSA by PCR NEGATIVE NEGATIVE Final    Comment:        The GeneXpert MRSA Assay (FDA approved for NASAL specimens only), is one component of a comprehensive MRSA colonization surveillance program. It is not intended to diagnose MRSA infection nor to guide or monitor treatment for MRSA infections.     RADIOLOGY STUDIES/RESULTS: Dg Chest Port 1 View  10/21/2014   CLINICAL DATA:  Shortness of breath.  EXAM: PORTABLE CHEST - 1 VIEW  COMPARISON:  September 18, 2014.  FINDINGS: The heart size and mediastinal contours are within normal limits. No pneumothorax or pleural effusion is noted. Status post Coronary artery bypass graft. No acute pulmonary disease is noted. The visualized skeletal structures are unremarkable.  IMPRESSION: No acute cardiopulmonary abnormality seen.   Electronically Signed   By: Marijo Conception, M.D.   On: 10/21/2014 08:55   Dg Abd Portable 2v  10/21/2014   CLINICAL DATA:  Vomiting.  EXAM: PORTABLE ABDOMEN - 2 VIEW  COMPARISON:  July 27, 2014.  FINDINGS: The bowel gas pattern is normal. There is no evidence of free air. No radio-opaque calculi or other significant radiographic abnormality is seen. Stool is noted in the rectum.  IMPRESSION: No evidence of bowel obstruction or ileus.   Electronically Signed   By: Marijo Conception, M.D.   On: 10/21/2014 08:52    Oren Binet, MD  [facility] Pager:[phone]  If 7PM-7AM, please contact night-coverage [email or web address] Password [email or web address] 10/22/2014, 9:25 AM   LOS: 2 days

## 2014-10-23 DIAGNOSIS — E872 Acidosis: Secondary | ICD-10-CM

## 2014-10-23 LAB — CBC
HCT: 25.5 % — ABNORMAL LOW (ref 36.0–46.0)
HEMOGLOBIN: 8.7 g/dL — AB (ref 12.0–15.0)
MCH: 30.1 pg (ref 26.0–34.0)
MCHC: 34.1 g/dL (ref 30.0–36.0)
MCV: 88.2 fL (ref 78.0–100.0)
Platelets: 184 10*3/uL (ref 150–400)
RBC: 2.89 MIL/uL — ABNORMAL LOW (ref 3.87–5.11)
RDW: 14.8 % (ref 11.5–15.5)
WBC: 6.2 10*3/uL (ref 4.0–10.5)

## 2014-10-23 LAB — GLUCOSE, CAPILLARY
GLUCOSE-CAPILLARY: 188 mg/dL — AB (ref 70–99)
Glucose-Capillary: 104 mg/dL — ABNORMAL HIGH (ref 70–99)
Glucose-Capillary: 162 mg/dL — ABNORMAL HIGH (ref 70–99)
Glucose-Capillary: 382 mg/dL — ABNORMAL HIGH (ref 70–99)
Glucose-Capillary: 46 mg/dL — ABNORMAL LOW (ref 70–99)
Glucose-Capillary: 73 mg/dL (ref 70–99)

## 2014-10-23 LAB — URINE CULTURE
Colony Count: NO GROWTH
Culture: NO GROWTH

## 2014-10-23 LAB — RENAL FUNCTION PANEL
Albumin: 2.2 g/dL — ABNORMAL LOW (ref 3.5–5.2)
Anion gap: 6 (ref 5–15)
BUN: 16 mg/dL (ref 6–23)
CO2: 26 mmol/L (ref 19–32)
Calcium: 7.9 mg/dL — ABNORMAL LOW (ref 8.4–10.5)
Chloride: 106 mmol/L (ref 96–112)
Creatinine, Ser: 2.77 mg/dL — ABNORMAL HIGH (ref 0.50–1.10)
GFR calc Af Amer: 20 mL/min — ABNORMAL LOW (ref >90)
GFR, EST NON AFRICAN AMERICAN: 17 mL/min — AB (ref >90)
Glucose, Bld: 225 mg/dL — ABNORMAL HIGH (ref 70–99)
PHOSPHORUS: 2.7 mg/dL (ref 2.3–4.6)
Potassium: 3.4 mmol/L — ABNORMAL LOW (ref 3.5–5.1)
Sodium: 138 mmol/L (ref 135–145)

## 2014-10-23 IMAGING — CR DG CHEST 2V
2 series · 2 of 2 positions shown · non-contrast
Comparison: 10/28/2013.

CLINICAL DATA: Weakness. Nausea vomiting. Diabetic. High blood
pressure.

EXAM:
CHEST  2 VIEW

[w chest pa]
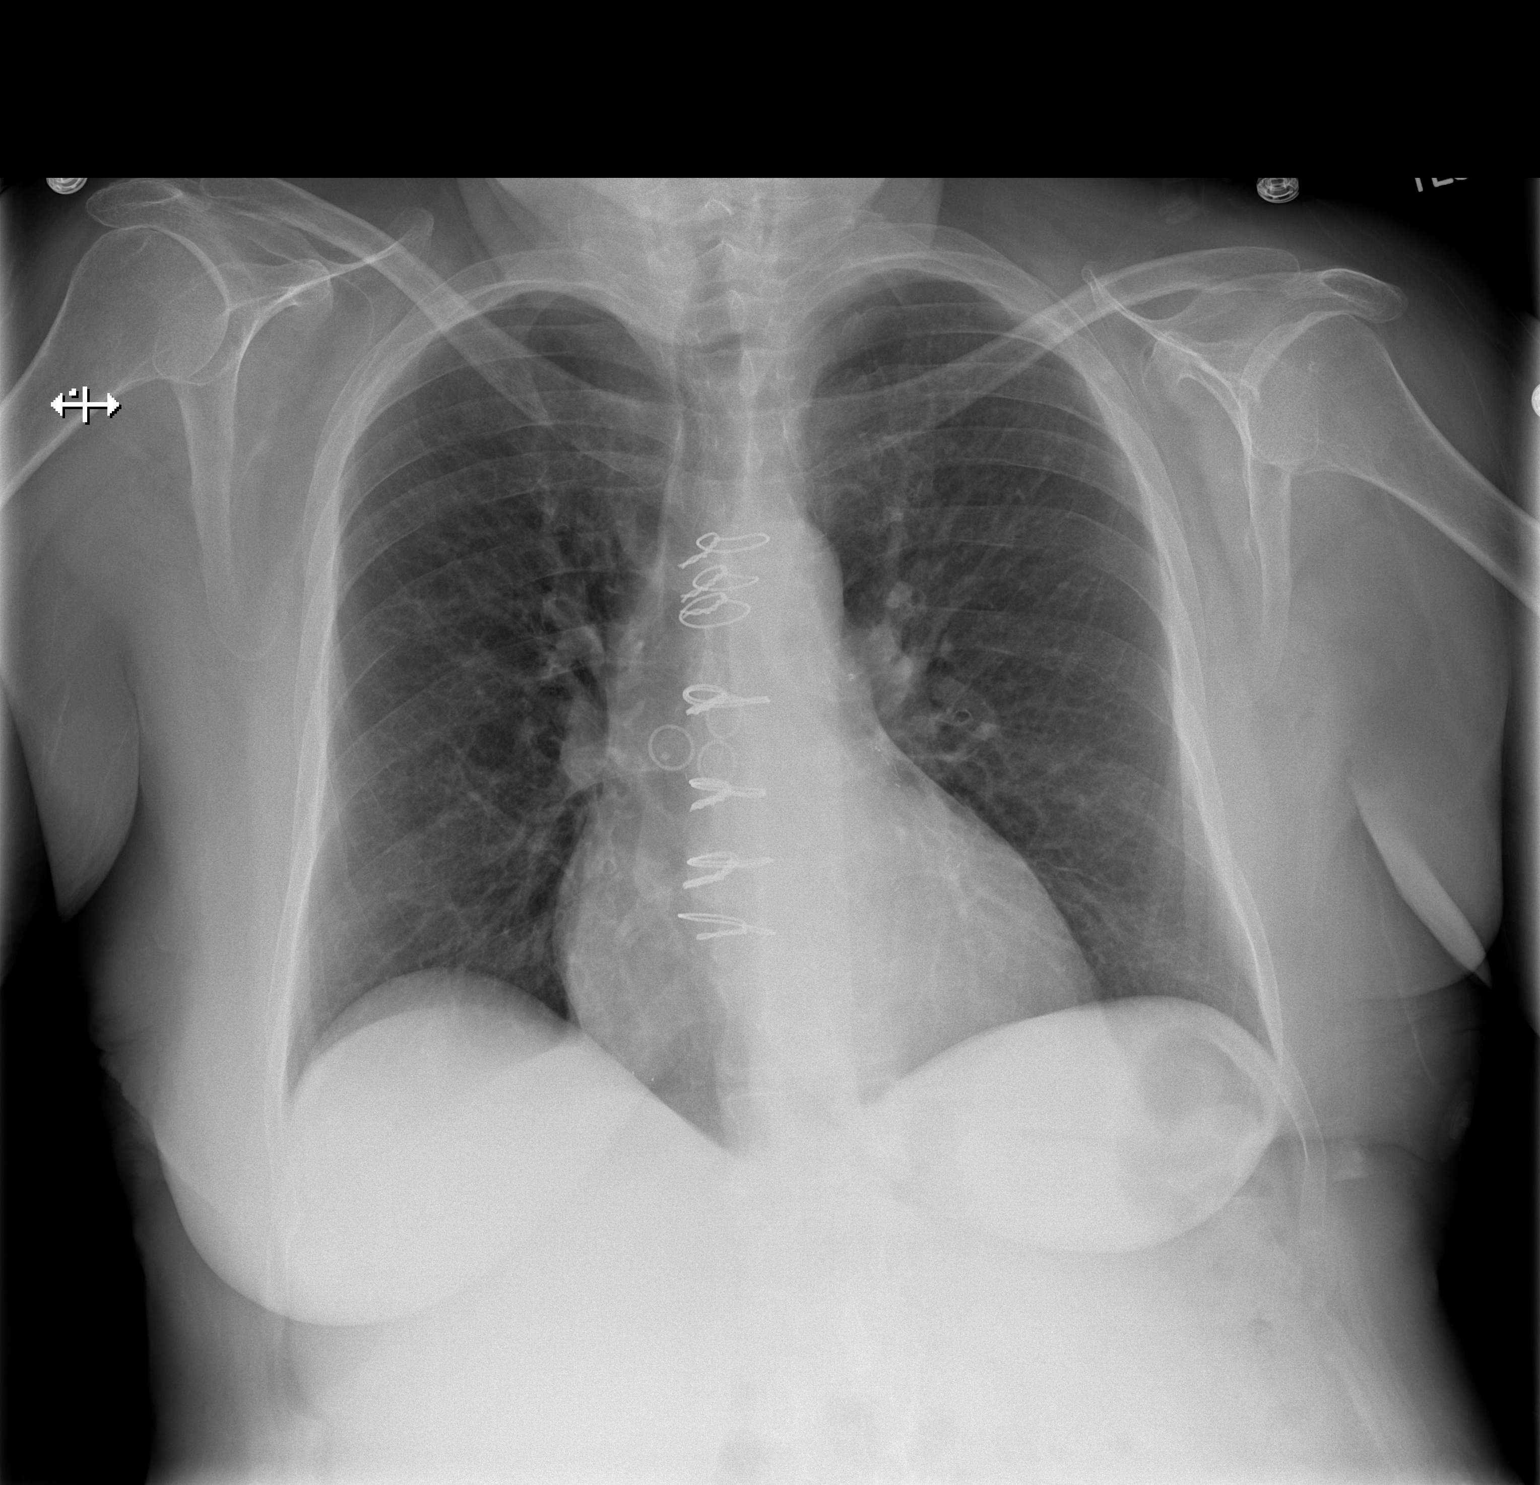

[w chest lat]
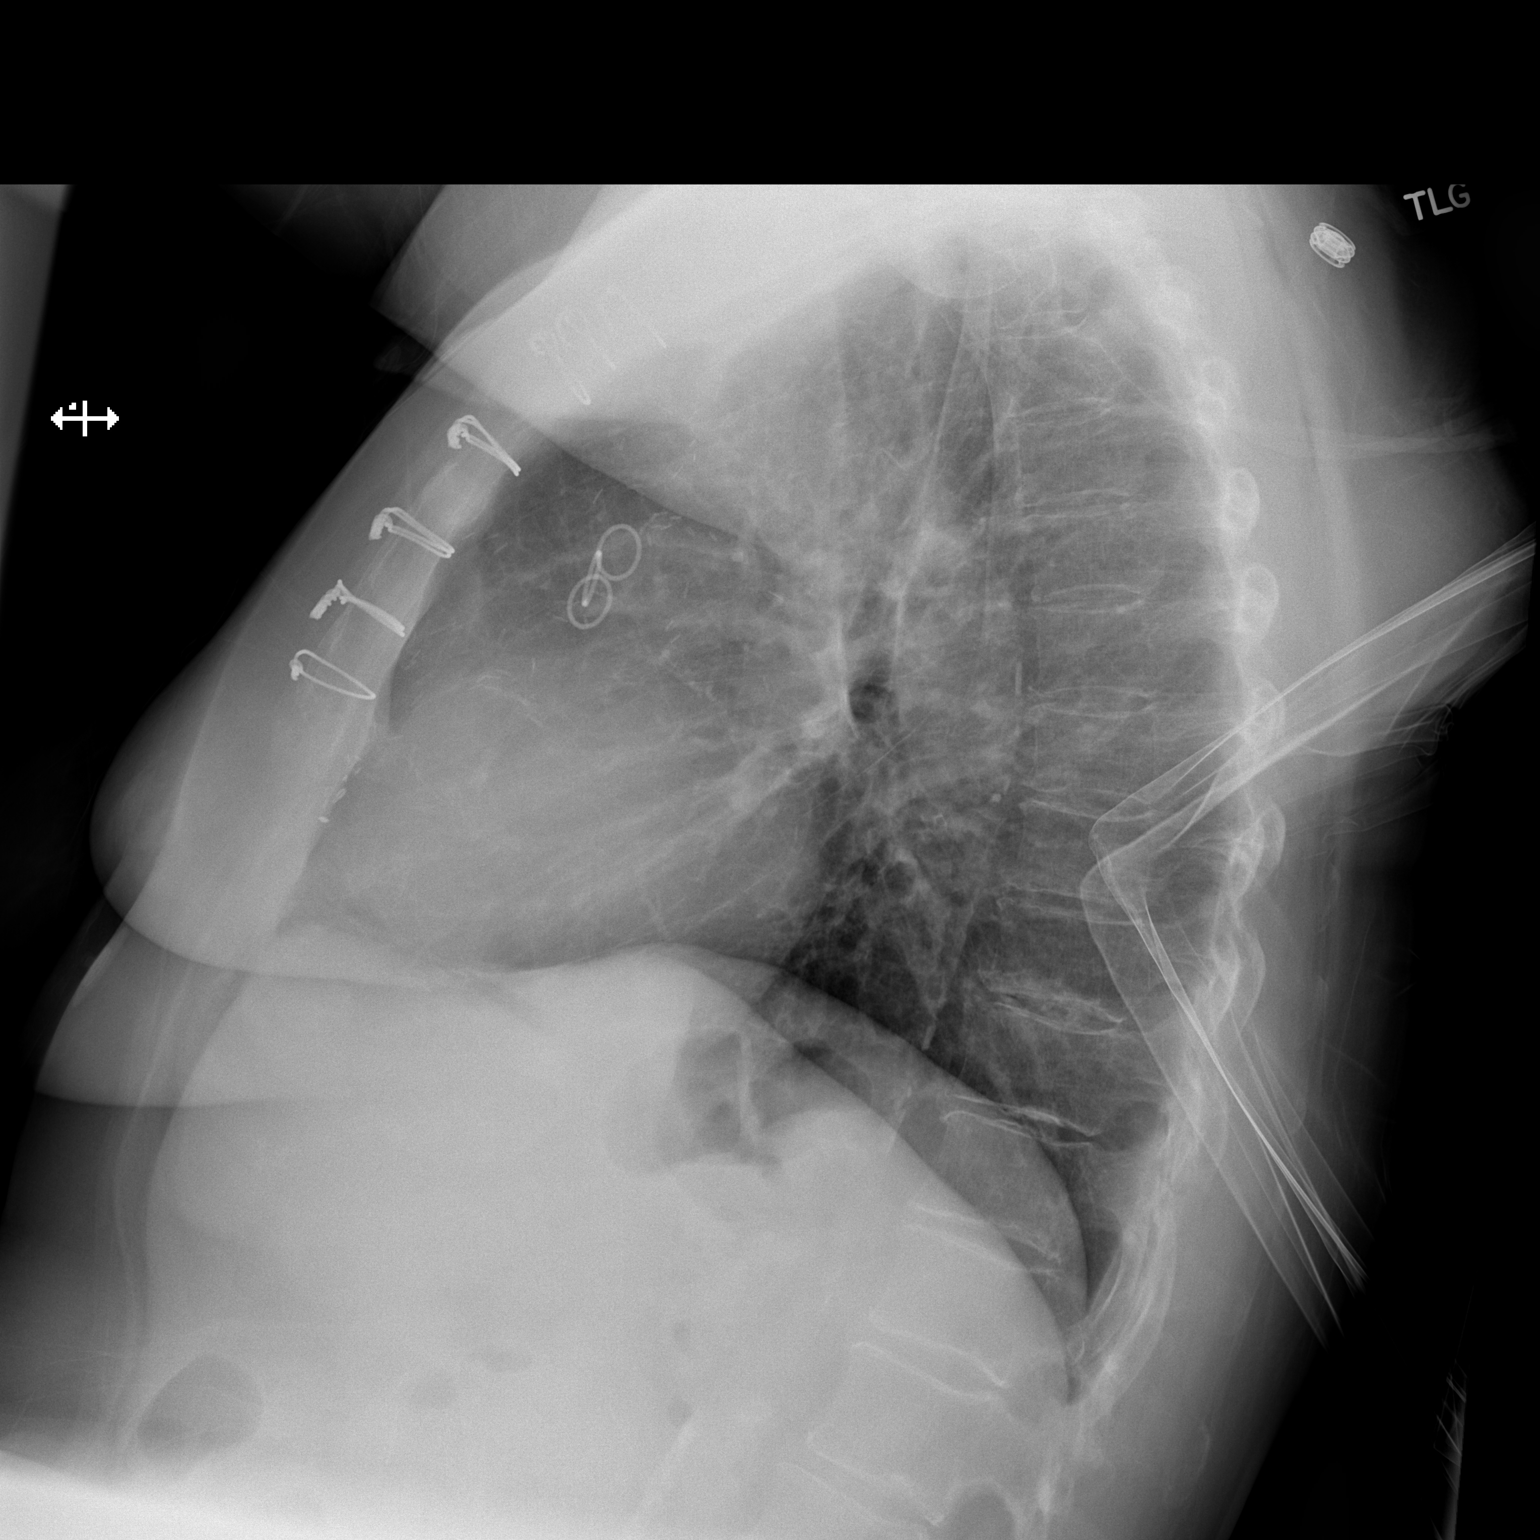

[2 of 2 positions shown; findings below may reference images not displayed]

FINDINGS: Post CABG.

Minimal peribronchial thickening and central pulmonary vascular
prominence without pulmonary edema, segmental infiltrate or
pneumothorax.

Calcified aorta.
IMPRESSION: No evidence of congestive heart failure or pneumonia. Please see
above.

## 2014-10-23 MED ORDER — INSULIN ASPART 100 UNIT/ML ~~LOC~~ SOLN
6.0000 [IU] | Freq: Three times a day (TID) | SUBCUTANEOUS | Status: DC
  Administered 2014-10-23 – 2014-10-24 (×3): 6 [IU] via SUBCUTANEOUS

## 2014-10-23 NOTE — Progress Notes (Signed)
PATIENT DETAILS Name: Bridget Martinez Age: 64 y.o. Sex: female Date of Birth: 1951-02-05 Admit Date: 10/20/2014 Admitting Physician Allyne Gee, MD PO:[id number] MARGARET, FNP  Subjective: Improving, continues to have intermittent nausea.  Assessment/Plan: Active Problems:   Diabetic ketoacidosis: Admitted with DKA with severe metabolic acidosis. Started on IV fluids and IV insulin. Bicarbonate now slowly increasing, but now has plateaued, likely is from ARF. Has been transitioned off IV insulin to Levemir and SSI    Acute encephalopathy: Likely metabolic encephalopathy secondary to acute on chronic and he disease and severe DKA.Resolved    Uncontrolled diabetes: Last A1c in 09/19/14 was 11.3. 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 I will follow CBGs in the chest dose of Levemir accordingly. Diet to be advanced to regular diet today.    Acute on chronic disease stage III: ARF likely secondary to pre-renal azotemia from persistent vomiting.Even with hydration with IV fluids, no further improvement in creatinine, Renal consulted. Renal ultrasound without hydronephroses.    Metabolic Acidosis:from DKA and ARF. Metabolic acidosis persisted even after IAnion gap closed, this was suspected due to acute renal failure. Patient was placed on IV fluid with bicarbonate, thankfully bicarbonate has now normalized. Stop all IV fluids, monitor. Follow renal function.      UTI:on Rocephin, await urine cs    Leukocytosis:Resolved, secondary to UTI.    Vomiting: Likely secondary to underlying gastroparesis. Abdomen is soft. Abdominal x-ray on 2/12 negative for acute abnormalities. Continue with Reglan. Much better, advance to regular diet-I've counseled regarding small portion meals.    Chronic systolic heart failure: Currently clinically compensated, last echocardiogram on 07/26/14 showed EF around  30-35%.     Hyperlipidemia: Continue with statins    Hypertension: Monitor off Coreg  Disposition: Remain inpatient  Antibiotics:  See below   Anti-infectives    Start     Dose/Rate Route Frequency Ordered Stop   10/21/14 0000  cefTRIAXone (ROCEPHIN) 1 g in dextrose 5 % 50 mL IVPB - Premix     1 g 100 mL/hr over 30 Minutes Intravenous Every 24 hours 10/20/14 2345        DVT Prophylaxis: Prophylactic Heparin  Code Status: Full code   Family Communication None at bedside  Procedures:  None  CONSULTS:  None  MEDICATIONS: Scheduled Meds: . aspirin EC  325 mg Oral Daily  . cefTRIAXone (ROCEPHIN)  IV  1 g Intravenous Q24H  . clopidogrel  75 mg Oral Q breakfast  . escitalopram  10 mg Oral Daily  . heparin  5,000 Units Subcutaneous 3 times per day  . insulin aspart  0-9 Units Subcutaneous TID WC  . insulin aspart  6 Units Subcutaneous TID WC  . insulin detemir  10 Units Subcutaneous Daily  . levothyroxine  125 mcg Oral QAC breakfast  . metoCLOPramide (REGLAN) injection  10 mg Intravenous TID AC  . polyethylene glycol  17 g Oral BID  . rosuvastatin  40 mg Oral Daily   Continuous Infusions:   PRN Meds:.hyoscyamine, ondansetron (ZOFRAN) IV    PHYSICAL EXAM: Vital signs in last 24 hours: Filed Vitals:   10/23/14 0545 10/23/14 0700 10/23/14 0759 10/23/14 0900  BP: 123/46 114/47 134/54 141/76  Pulse: 95 83 100 85  Temp: 98 F (36.7 C)  97.5 F (36.4 C)   TempSrc: Oral  Oral   Resp: 22 14 22 18   Height:  Weight:      SpO2: 96% 96% 97% 99%    Weight change:  Filed Weights   10/20/14 1959  Weight: 65.772 kg (145 lb)   Body mass index is 22.71 kg/(m^2).   Gen Exam: Awake and alert with clear speech.   Neck: Supple, No JVD.   Chest: B/L Clear.No rales or rhonchi CVS: S1 S2 Regular, no murmurs.  Abdomen: soft, BS +, non tender, non distended.  Extremities: no edema, lower extremities warm to touch. Neurologic: Non Focal.   Skin: No Rash.     Wounds: N/A.    Intake/Output from previous day:  Intake/Output Summary (Last 24 hours) at 10/23/14 1051 Last data filed at 10/23/14 1008  Gross per 24 hour  Intake 2067.5 ml  Output   2550 ml  Net -482.5 ml     LAB RESULTS: CBC  Recent Labs Lab 10/20/14 1951 10/20/14 2048 10/22/14 0305 10/23/14 0413  WBC 18.8*  --  8.6 6.2  HGB 9.9* 12.2 9.0* 8.7*  HCT 31.6* 36.0 26.2* 25.5*  PLT 312  --  210 184  MCV 96.3  --  86.2 88.2  MCH 30.2  --  29.6 30.1  MCHC 31.3  --  34.4 34.1  RDW 14.9  --  14.8 14.8  LYMPHSABS 1.1  --   --   --   MONOABS 1.5*  --   --   --   EOSABS 0.0  --   --   --   BASOSABS 0.0  --   --   --     Chemistries   Recent Labs Lab 10/21/14 0050  10/21/14 1550 10/21/14 2010 10/22/14 0305 10/22/14 0615 10/23/14 0413  NA 142  < > 138 135 136 135 138  K 3.3*  < > 3.4* 4.5 4.6 3.5 3.4*  CL 110  < > 112 112 109 106 106  CO2 6*  < > 11* 11* 14* 14* 26  GLUCOSE 240*  < > 143* 198* 194* 314* 225*  BUN 23  < > 18 20 15 18 16   CREATININE 2.77*  < > 2.60* 2.70* 2.71* 3.01* 2.77*  CALCIUM 8.9  < > 8.1* 7.8* 7.8* 7.7* 7.9*  MG 2.0  --   --   --   --   --   --   < > = values in this interval not displayed.  CBG:  Recent Labs Lab 10/22/14 1201 10/22/14 1627 10/22/14 1715 10/23/14 0004 10/23/14 0757  GLUCAP 160* 69* 126* 162* 382*    GFR Estimated Creatinine Clearance: 20 mL/min (by C-G formula based on Cr of 2.77).  Coagulation profile No results for input(s): INR, PROTIME in the last 168 hours.  Cardiac Enzymes No results for input(s): CKMB, TROPONINI, MYOGLOBIN in the last 168 hours.  Invalid input(s): CK  Invalid input(s): POCBNP No results for input(s): DDIMER in the last 72 hours.  Recent Labs  10/21/14 0050  HGBA1C 10.5*   No results for input(s): CHOL, HDL, LDLCALC, TRIG, CHOLHDL, LDLDIRECT in the last 72 hours. No results for input(s): TSH, T4TOTAL, T3FREE, THYROIDAB in the last 72 hours.  Invalid input(s): FREET3 No  results for input(s): VITAMINB12, FOLATE, FERRITIN, TIBC, IRON, RETICCTPCT in the last 72 hours.  Recent Labs  10/20/14 1951  LIPASE 22    Urine Studies No results for input(s): UHGB, CRYS in the last 72 hours.  Invalid input(s): UACOL, UAPR, USPG, UPH, UTP, UGL, UKET, UBIL, UNIT, UROB, ULEU, UEPI, UWBC, URBC, UBAC, CAST, UCOM, BILUA  MICROBIOLOGY: Recent Results (from the past 240 hour(s))  MRSA PCR Screening     Status: None   Collection Time: 10/20/14 11:26 PM  Result Value Ref Range Status   MRSA by PCR NEGATIVE NEGATIVE Final    Comment:        The GeneXpert MRSA Assay (FDA approved for NASAL specimens only), is one component of a comprehensive MRSA colonization surveillance program. It is not intended to diagnose MRSA infection nor to guide or monitor treatment for MRSA infections.     RADIOLOGY STUDIES/RESULTS: US Renal  10/22/2014   CLINICAL DATA:  Acute renal failure.  EXAM: RENAL/URINARY TRACT ULTRASOUND COMPLETE  COMPARISON:  CT of the abdomen and pelvis on 07/30/2014 and ultrasound 05/23/2012  FINDINGS: Right Kidney:  Length: 12.8 cm. Small upper pole cyst is 0.9 x 0.8 x 1.0 cm. No hydronephrosis. Normal renal echogenicity.  Left Kidney:  Length: 11.6 cm. Echogenicity within normal limits. No mass or hydronephrosis visualized.  Bladder:  The bladder is partially decompressed by a Foley catheter.  IMPRESSION: 1. No hydronephrosis. 2. Stable right renal cyst.   Electronically Signed   By: Nolon Nations M.D.   On: 10/22/2014 10:42   Dg Chest Port 1 View  10/21/2014   CLINICAL DATA:  Shortness of breath.  EXAM: PORTABLE CHEST - 1 VIEW  COMPARISON:  September 18, 2014.  FINDINGS: The heart size and mediastinal contours are within normal limits. No pneumothorax or pleural effusion is noted. Status post Coronary artery bypass graft. No acute pulmonary disease is noted. The visualized skeletal structures are unremarkable.  IMPRESSION: No acute cardiopulmonary abnormality  seen.   Electronically Signed   By: Marijo Conception, M.D.   On: 10/21/2014 08:55   Dg Abd Portable 2v  10/21/2014   CLINICAL DATA:  Vomiting.  EXAM: PORTABLE ABDOMEN - 2 VIEW  COMPARISON:  July 27, 2014.  FINDINGS: The bowel gas pattern is normal. There is no evidence of free air. No radio-opaque calculi or other significant radiographic abnormality is seen. Stool is noted in the rectum.  IMPRESSION: No evidence of bowel obstruction or ileus.   Electronically Signed   By: Marijo Conception, M.D.   On: 10/21/2014 08:52    Oren Binet, MD  [facility] Pager:[phone]  If 7PM-7AM, please contact night-coverage [email or web address] Password [facility] 10/23/2014, 10:51 AM   LOS: 3 days

## 2014-10-23 NOTE — Progress Notes (Addendum)
Notified CBG 46. Hypoglycemia protocol initiated. 4 oz juice and graham crackers given. Repeat CBG 73. Susie Cassette RN

## 2014-10-23 NOTE — Progress Notes (Signed)
Bridget Martinez Progress Note   Subjective: No complaints, creat down, good UOP  Filed Vitals:   10/22/14 1700 10/22/14 2018 10/23/14 0001 10/23/14 0545  BP: 140/57 116/54 96/83 123/46  Pulse: 98 83 85 95  Temp:  97.6 F (36.4 C) 98.5 F (36.9 C) 98 F (36.7 C)  TempSrc:  Oral Oral Oral  Resp: 23 18 15 22   Height:      Weight:      SpO2: 100% 100% 99% 96%   Exam: Chronically ill-appearing WF , up in chair, NAD No jvd Chest clear bilat RRR no MRG, sternotomy scar Abd soft, NTND, no ascites or masses No fem bruit 1+ nonpitting LE edema bilat Neuro is nf, ox 3  Date Creat 2012 0.80- 2.15 20130.75- 1.55 20140.81- 1.72 20150.87- 1.50 Jan 7 - 11,20162.16 > 0.88  Renal US > 11-13 cm kidneys, on hydronephrosis.  UA 2/12 (lab) > 1.011, 5.0, 500 glu, 30 prot, mod LE/Hb 21-50wbc, 7-10rbc, rare epi, few bact, gran cast UA 2/13 (undersigned) > 1.010, 5.0, 1+LE, Hb, 1+protein, 1+, 20-30 WBC/ HPF, numerous granular casts , no cellular casts, abundant debris, no rbc's ECHO 11/15 > EF 30-35%, no WMA, grade 1 DD, reduced RV syst fxn CXR 2/12 no active dz  Assessment: 1. Acute kidney injury - due to ATN. Creat down today and UOP good. Hold coreg, let BP come up for now. Daily wts. Labs in am. 2. DKA - resolved 3. DM type 1 age of onset 24 4. AMS better 5. CKD - multiple prior episodes AKI related to DKA/ dehydration. Baseline creat 0.9-1.0 6. UTI on rocephin 7. Systolic CHF - stable, EF 30-35%  Plan- as above    Kelly Splinter MD  pager [phone]    cell [phone]  10/23/2014, 7:48 AM     Recent Labs Lab 10/21/14 0050  10/22/14 0305 10/22/14 0615 10/23/14 0413  NA 142  < > 136 135 138  K 3.3*  < > 4.6 3.5 3.4*  CL 110  < > 109 106 106  CO2 6*  < > 14* 14* 26  GLUCOSE 240*  < >  194* 314* 225*  BUN 23  < > 15 18 16   CREATININE 2.77*  < > 2.71* 3.01* 2.77*  CALCIUM 8.9  < > 7.8* 7.7* 7.9*  PHOS 6.2*  --   --   --  2.7  < > = values in this interval not displayed.  Recent Labs Lab 10/20/14 1951 10/23/14 0413  AST 30  --   ALT 17  --   ALKPHOS 139*  --   BILITOT 2.1*  --   PROT 5.7*  --   ALBUMIN 2.8* 2.2*    Recent Labs Lab 10/20/14 1951 10/20/14 2048 10/22/14 0305 10/23/14 0413  WBC 18.8*  --  8.6 6.2  NEUTROABS 16.2*  --   --   --   HGB 9.9* 12.2 9.0* 8.7*  HCT 31.6* 36.0 26.2* 25.5*  MCV 96.3  --  86.2 88.2  PLT 312  --  210 184   . aspirin EC  325 mg Oral Daily  . carvedilol  6.25 mg Oral BID WC  . cefTRIAXone (ROCEPHIN)  IV  1 g Intravenous Q24H  . clopidogrel  75 mg Oral Q breakfast  . escitalopram  10 mg Oral Daily  . heparin  5,000 Units Subcutaneous 3 times per day  . insulin aspart  0-9 Units Subcutaneous TID WC  . insulin detemir  10 Units Subcutaneous Daily  .  levothyroxine  125 mcg Oral QAC breakfast  . metoCLOPramide (REGLAN) injection  10 mg Intravenous TID AC  . pantoprazole  40 mg Oral Q1200  . polyethylene glycol  17 g Oral BID  . rosuvastatin  40 mg Oral Daily   . dextrose 5 % 1,000 mL with potassium chloride 20 mEq, sodium bicarbonate 100 mEq infusion 75 mL/hr at 10/22/14 1649   hyoscyamine, ondansetron (ZOFRAN) IV

## 2014-10-24 DIAGNOSIS — G9340 Encephalopathy, unspecified: Secondary | ICD-10-CM

## 2014-10-24 LAB — CBC
HEMATOCRIT: 27.9 % — AB (ref 36.0–46.0)
Hemoglobin: 9.5 g/dL — ABNORMAL LOW (ref 12.0–15.0)
MCH: 30.4 pg (ref 26.0–34.0)
MCHC: 34.1 g/dL (ref 30.0–36.0)
MCV: 89.1 fL (ref 78.0–100.0)
Platelets: 206 10*3/uL (ref 150–400)
RBC: 3.13 MIL/uL — AB (ref 3.87–5.11)
RDW: 14.6 % (ref 11.5–15.5)
WBC: 5.3 10*3/uL (ref 4.0–10.5)

## 2014-10-24 LAB — RENAL FUNCTION PANEL
ANION GAP: 10 (ref 5–15)
Albumin: 2.2 g/dL — ABNORMAL LOW (ref 3.5–5.2)
BUN: 17 mg/dL (ref 6–23)
CO2: 23 mmol/L (ref 19–32)
CREATININE: 2.72 mg/dL — AB (ref 0.50–1.10)
Calcium: 8.1 mg/dL — ABNORMAL LOW (ref 8.4–10.5)
Chloride: 105 mmol/L (ref 96–112)
GFR calc Af Amer: 20 mL/min — ABNORMAL LOW (ref >90)
GFR, EST NON AFRICAN AMERICAN: 17 mL/min — AB (ref >90)
GLUCOSE: 125 mg/dL — AB (ref 70–99)
PHOSPHORUS: 3.2 mg/dL (ref 2.3–4.6)
Potassium: 3.8 mmol/L (ref 3.5–5.1)
SODIUM: 138 mmol/L (ref 135–145)

## 2014-10-24 LAB — GLUCOSE, CAPILLARY
GLUCOSE-CAPILLARY: 138 mg/dL — AB (ref 70–99)
GLUCOSE-CAPILLARY: 40 mg/dL — AB (ref 70–99)
Glucose-Capillary: 164 mg/dL — ABNORMAL HIGH (ref 70–99)
Glucose-Capillary: 85 mg/dL (ref 70–99)
Glucose-Capillary: 197 mg/dL — ABNORMAL HIGH (ref 70–99)

## 2014-10-24 MED ORDER — INSULIN DETEMIR 100 UNIT/ML ~~LOC~~ SOLN
8.0000 [IU] | Freq: Every day | SUBCUTANEOUS | Status: DC
  Filled 2014-10-24: qty 0.08

## 2014-10-24 MED ORDER — INSULIN ASPART 100 UNIT/ML ~~LOC~~ SOLN
4.0000 [IU] | Freq: Three times a day (TID) | SUBCUTANEOUS | Status: DC
  Administered 2014-10-25 (×2): 4 [IU] via SUBCUTANEOUS

## 2014-10-24 NOTE — Progress Notes (Addendum)
Inpatient Diabetes Program Recommendations  AACE/ADA: New Consensus Statement on Inpatient Glycemic Control (2013)  Target Ranges:  Prepandial:   less than 140 mg/dL      Peak postprandial:   less than 180 mg/dL (1-2 hours)      Critically ill patients:  140 - 180 mg/dL   Reason for Assessment:  Results for LINDEY, SWALLOW (MRN [id number]) as of 10/24/2014 12:29  Ref. Range 10/23/2014 07:57 10/23/2014 11:14 10/23/2014 15:58 10/23/2014 16:25 10/23/2014 20:33 10/24/2014 07:26 10/24/2014 11:04  Glucose-Capillary Latest Range: 70-99 mg/dL 382 (H) 188 (H) 46 (L) 73 104 (H) 164 (H) 138 (H)    Diabetes history: Type 1 diabetes- According to chart, patient see's Dr. Buddy Duty. Outpatient Diabetes medications:  Tresiba 15 units daily (recently added)-  According to patient she has not started taking new insulin yet.  States that she takes Levemir 8 units q AM and Levemir 7 units q PM, Novolog (9-20 units) tid with meals  Current orders for Inpatient glycemic control:  Levemir 10 units daily, Novolog 6 units tid with meals, Novolog sensitive tid with meals  Please consider reducing Novolog meal coverage to 4 units tid with meals.    Pt. states that she was started on a new insulin by Dr. Buddy Duty but has not started it yet.  She states that her diabetes has been difficult to control due to her gastroparesis.  Briefly discussed the new insulin profile with her and she seemed to understand.  Plans to follow back up with Dr. Buddy Duty.    Thanks, Adah Perl, RN, BC-ADM Inpatient Diabetes Coordinator Pager [phone]

## 2014-10-24 NOTE — Evaluation (Signed)
Physical Therapy Evaluation Patient Details Name: Bridget Martinez MRN: [id number] DOB: 26-Sep-1950 Today's Date: 10/24/2014   History of Present Illness  Pt is a 64 y.o. female presents witth DKA. Patient is not able to provide any history in the ED. Apparently she has had a prior history of DKA presentation and was just discharged in January of this year. Today she had been noted to have an elevated glucose for she was given insulin by her husband. After having received 34 units she had a glucose reading of 311. Later in the evening though she had a HIGH reading prompting the husband to call EMS. Upon arrival she was given IV saline and a CBG was rechecked and found to be 546 here. She is noted to be tachycardic also.  Clinical Impression  Pt admitted with above diagnosis. Pt currently with functional limitations due to the deficits listed below (see PT Problem List). At the time of PT eval pt was able to perform transfers and ambulation with min guard assist. Pt fatigued quickly with ambulation and RW was recommended for energy conservation. Pt will benefit from skilled PT to increase their independence and safety with mobility to allow discharge to the venue listed below.       Follow Up Recommendations Home health PT;Supervision for mobility/OOB    Equipment Recommendations  None recommended by PT    Recommendations for Other Services       Precautions / Restrictions Precautions Precautions: Fall Restrictions Weight Bearing Restrictions: No      Mobility  Bed Mobility Overal bed mobility: Needs Assistance Bed Mobility: Supine to Sit     Supine to sit: Min guard     General bed mobility comments: Pt was able to initiate transfer to EOB while trying not to use the bed rails for assist. Pt with difficulty with full trunk elevation and min guard assist was provided to complete transfer to EOB.   Transfers Overall transfer level: Needs assistance Equipment used: Rolling walker  (2 wheeled) Transfers: Sit to/from Stand Sit to Stand: Min guard         General transfer comment: Pt was able to power-up to full standing without assistance. Close guard for safety provided.  Ambulation/Gait Ambulation/Gait assistance: Min guard Ambulation Distance (Feet): 75 Feet Assistive device: Rolling walker (2 wheeled) Gait Pattern/deviations: Step-through pattern;Decreased stride length;Trunk flexed;Narrow base of support Gait velocity: Decreased Gait velocity interpretation: Below normal speed for age/gender General Gait Details: Pt was able to ambulate with RW for support and energy conservation. Pt was cued for pursed-lip breathing and appeared slightly SOB at end of gait training. Overall moving very slow and guarded.  Stairs            Wheelchair Mobility    Modified Rankin (Stroke Patients Only)       Balance Overall balance assessment: Needs assistance;History of Falls Sitting-balance support: Feet supported;No upper extremity supported Sitting balance-Leahy Scale: Fair     Standing balance support: No upper extremity supported Standing balance-Leahy Scale: Fair                               Pertinent Vitals/Pain Pain Assessment: No/denies pain    Home Living Family/patient expects to be discharged to:: Private residence Living Arrangements: Spouse/significant other Available Help at Discharge: Family;Available 24 hours/day Type of Home: House Home Access: Stairs to enter Entrance Stairs-Rails: None Entrance Stairs-Number of Steps: 3 Home Layout: One level Home Equipment: Environmental consultant -  2 wheels;Cane - single point;Wheelchair - [employer];Shower seat      Prior Function Level of Independence: Independent         Comments: Drives     Hand Dominance        Extremity/Trunk Assessment   Upper Extremity Assessment: Defer to OT evaluation           Lower Extremity Assessment: Generalized weakness       Cervical / Trunk Assessment: Normal  Communication   Communication: No difficulties  Cognition Arousal/Alertness: Awake/alert Behavior During Therapy: WFL for tasks assessed/performed Overall Cognitive Status: Within Functional Limits for tasks assessed                      General Comments      Exercises        Assessment/Plan    PT Assessment Patient needs continued PT services  PT Diagnosis Difficulty walking;Generalized weakness   PT Problem List Decreased strength;Decreased range of motion;Decreased activity tolerance;Decreased balance;Decreased mobility;Decreased knowledge of use of DME;Decreased safety awareness;Decreased knowledge of precautions;Cardiopulmonary status limiting activity  PT Treatment Interventions DME instruction;Gait training;Stair training;Functional mobility training;Therapeutic activities;Therapeutic exercise;Neuromuscular re-education;Patient/family education   PT Goals (Current goals can be found in the Care Plan section) Acute Rehab PT Goals Patient Stated Goal: Home with husband today PT Goal Formulation: With patient Time For Goal Achievement: 10/31/14 Potential to Achieve Goals: Good    Frequency Min 3X/week   Barriers to discharge        Co-evaluation               End of Session Equipment Utilized During Treatment: Gait belt Activity Tolerance: Patient limited by fatigue Patient left: in chair;with chair alarm set;with call bell/phone within reach Nurse Communication: Mobility status         Time: [id number] PT Time Calculation (min) (ACUTE ONLY): 17 min   Charges:   PT Evaluation $Initial PT Evaluation Tier I: 1 Procedure     PT G CodesRolinda Roan November 14, 2014, 10:27 AM   Rolinda Roan, PT, DPT Acute Rehabilitation Services Pager: [phone]

## 2014-10-24 NOTE — Progress Notes (Signed)
Blood sugar was 40. Hypoglycemia protocol initiated. Recheck blood sugar was 85. MD made aware.

## 2014-10-24 NOTE — Progress Notes (Signed)
PATIENT DETAILS Name: Bridget Martinez Age: 64 y.o. Sex: female Date of Birth: [birth year] Admit Date: 10/20/2014 Admitting Physician Allyne Gee, MD PO:[id number] MARGARET, FNP  Subjective: No vomiting-tolerating regular diet  Assessment/Plan: Active Problems:   Diabetic ketoacidosis: Admitted with DKA with severe metabolic acidosis. Started on IV fluids and IV insulin, once anion gap close, patient was transitioned off IV insulin to Levemir and SSI    Acute encephalopathy: Likely metabolic encephalopathy secondary to acute on chronic and he disease and severe DKA.Resolved with supportive care, IV Insulin.    Uncontrolled diabetes: Last A1c in 09/19/14 was 11.3. 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 Renal ultrasound without hydronephroses.    Metabolic Acidosis:from DKA and ARF. Metabolic acidosis persisted even after Anion gap closed, this was suspected due to acute renal failure. Patient was placed on IV fluid with bicarbonate, thankfully bicarbonate has now normalized. Follow renal function.      UTI:on Rocephin since 2/11, since urine cs neg-will stop on 2/15    Leukocytosis:Resolved, secondary to UTI.    Vomiting: Likely secondary to underlying gastroparesis. Abdomen is soft. Abdominal x-ray on 2/12 negative for acute abnormalities. Continue with Reglan. Much better, advanced diet to regular diet-I've counseled regarding small portion meals.    Chronic systolic heart failure: Currently clinically compensated, last echocardiogram on 07/26/14 showed EF around 30-35%.     Hyperlipidemia: Continue with statins    Hypertension: Monitor off Coreg  Disposition: Remain  inpatient-home in 1-2 days  Antibiotics:  See below   Anti-infectives    Start     Dose/Rate Route Frequency Ordered Stop   10/21/14 0000  cefTRIAXone (ROCEPHIN) 1 g in dextrose 5 % 50 mL IVPB - Premix     1 g 100 mL/hr over 30 Minutes Intravenous Every 24 hours 10/20/14 2345        DVT Prophylaxis: Prophylactic Heparin  Code Status: Full code   Family Communication None at bedside  Procedures:  None  CONSULTS:  None  MEDICATIONS: Scheduled Meds: . aspirin EC  325 mg Oral Daily  . cefTRIAXone (ROCEPHIN)  IV  1 g Intravenous Q24H  . clopidogrel  75 mg Oral Q breakfast  . escitalopram  10 mg Oral Daily  . insulin aspart  0-9 Units Subcutaneous TID WC  . insulin aspart  6 Units Subcutaneous TID WC  . insulin detemir  10 Units Subcutaneous Daily  . levothyroxine  125 mcg Oral QAC breakfast  . metoCLOPramide (REGLAN) injection  10 mg Intravenous TID AC  . polyethylene glycol  17 g Oral BID  . rosuvastatin  40 mg Oral Daily   Continuous Infusions:   PRN Meds:.hyoscyamine, ondansetron (ZOFRAN) IV    PHYSICAL EXAM: Vital signs in last 24 hours: Filed Vitals:   10/23/14 1121 10/23/14 2000 10/24/14 0500 10/24/14 0944  BP: 137/76 118/54 134/57 125/55  Pulse: 83 84 80 89  Temp: 98.2 F (36.8 C) 98.8 F (37.1 C) 98.4 F (36.9 C) 98.3 F (36.8 C)  TempSrc: Oral Oral Oral Oral  Resp: 17 18 17 17   Height:      Weight:      SpO2: 100% 99% 98% 98%    Weight change:  [employer]  Weights   10/20/14 1959 10/23/14 1100  Weight: 65.772 kg (145 lb) 70.625 kg (155 lb 11.2 oz)   Body mass index is 24.38 kg/(m^2).   Gen Exam: Awake and alert with clear speech.   Neck: Supple, No JVD.   Chest: B/L Clear.No rales or rhonchi CVS: S1 S2 Regular, no murmurs.  Abdomen: soft, BS +, non tender, non distended.  Extremities: trace edema, lower extremities warm to touch. Neurologic: Non Focal.   Skin: No Rash.   Wounds: N/A.    Intake/Output from previous  day:  Intake/Output Summary (Last 24 hours) at 10/24/14 1117 Last data filed at 10/24/14 1101  Gross per 24 hour  Intake    960 ml  Output   2075 ml  Net  -1115 ml     LAB RESULTS: CBC  Recent Labs Lab 10/20/14 1951 10/20/14 2048 10/22/14 0305 10/23/14 0413 10/24/14 0625  WBC 18.8*  --  8.6 6.2 5.3  HGB 9.9* 12.2 9.0* 8.7* 9.5*  HCT 31.6* 36.0 26.2* 25.5* 27.9*  PLT 312  --  210 184 206  MCV 96.3  --  86.2 88.2 89.1  MCH 30.2  --  29.6 30.1 30.4  MCHC 31.3  --  34.4 34.1 34.1  RDW 14.9  --  14.8 14.8 14.6  LYMPHSABS 1.1  --   --   --   --   MONOABS 1.5*  --   --   --   --   EOSABS 0.0  --   --   --   --   BASOSABS 0.0  --   --   --   --     Chemistries   Recent Labs Lab 10/21/14 0050  10/21/14 2010 10/22/14 0305 10/22/14 0615 10/23/14 0413 10/24/14 0625  NA 142  < > 135 136 135 138 138  K 3.3*  < > 4.5 4.6 3.5 3.4* 3.8  CL 110  < > 112 109 106 106 105  CO2 6*  < > 11* 14* 14* 26 23  GLUCOSE 240*  < > 198* 194* 314* 225* 125*  BUN 23  < > 20 15 18 16 17   CREATININE 2.77*  < > 2.70* 2.71* 3.01* 2.77* 2.72*  CALCIUM 8.9  < > 7.8* 7.8* 7.7* 7.9* 8.1*  MG 2.0  --   --   --   --   --   --   < > = values in this interval not displayed.  CBG:  Recent Labs Lab 10/23/14 1114 10/23/14 1558 10/23/14 1625 10/23/14 2033 10/24/14 0726  GLUCAP 188* 46* 73 104* 164*    GFR Estimated Creatinine Clearance: 20.3 mL/min (by C-G formula based on Cr of 2.72).  Coagulation profile No results for input(s): INR, PROTIME in the last 168 hours.  Cardiac Enzymes No results for input(s): CKMB, TROPONINI, MYOGLOBIN in the last 168 hours.  Invalid input(s): CK  Invalid input(s): POCBNP No results for input(s): DDIMER in the last 72 hours. No results for input(s): HGBA1C in the last 72 hours. No results for input(s): CHOL, HDL, LDLCALC, TRIG, CHOLHDL, LDLDIRECT in the last 72 hours. No results for input(s): TSH, T4TOTAL, T3FREE, THYROIDAB in the last 72  hours.  Invalid input(s): FREET3 No results for input(s): VITAMINB12, FOLATE, FERRITIN, TIBC, IRON, RETICCTPCT in the last 72 hours. No results for input(s): LIPASE, AMYLASE in the last 72 hours.  Urine Studies No results for input(s): UHGB, CRYS in the last 72 hours.  Invalid input(s): UACOL, UAPR, USPG, UPH,  UTP, UGL, UKET, UBIL, UNIT, UROB, ULEU, UEPI, UWBC, URBC, UBAC, CAST, UCOM, BILUA  MICROBIOLOGY: Recent Results (from the past 240 hour(s))  MRSA PCR Screening     Status: None   Collection Time: 10/20/14 11:26 PM  Result Value Ref Range Status   MRSA by PCR NEGATIVE NEGATIVE Final    Comment:        The GeneXpert MRSA Assay (FDA approved for NASAL specimens only), is one component of a comprehensive MRSA colonization surveillance program. It is not intended to diagnose MRSA infection nor to guide or monitor treatment for MRSA infections.   Culture, blood (routine x 2)     Status: None (Preliminary result)   Collection Time: 10/21/14 12:50 AM  Result Value Ref Range Status   Specimen Description BLOOD LEFT ANTECUBITAL  Final   Special Requests BOTTLES DRAWN AEROBIC AND ANAEROBIC 3CC  Final   Culture   Final           BLOOD CULTURE RECEIVED NO GROWTH TO DATE CULTURE WILL BE HELD FOR 5 DAYS BEFORE ISSUING A FINAL NEGATIVE REPORT Performed at [facility]    Report Status PENDING  Incomplete  Culture, blood (routine x 2)     Status: None (Preliminary result)   Collection Time: 10/21/14 12:55 AM  Result Value Ref Range Status   Specimen Description BLOOD LEFT HAND  Final   Special Requests BOTTLES DRAWN AEROBIC AND ANAEROBIC 5CC  Final   Culture   Final           BLOOD CULTURE RECEIVED NO GROWTH TO DATE CULTURE WILL BE HELD FOR 5 DAYS BEFORE ISSUING A FINAL NEGATIVE REPORT Performed at [facility]    Report Status PENDING  Incomplete  Urine culture     Status: None   Collection Time: 10/22/14 12:20 PM  Result Value Ref Range Status   Specimen  Description URINE, CATHETERIZED  Final   Special Requests NONE  Final   Colony Count NO GROWTH Performed at [facility]   Final   Culture NO GROWTH Performed at [facility]   Final   Report Status 10/23/2014 FINAL  Final    RADIOLOGY STUDIES/RESULTS: US Renal  10/22/2014   CLINICAL DATA:  Acute renal failure.  EXAM: RENAL/URINARY TRACT ULTRASOUND COMPLETE  COMPARISON:  CT of the abdomen and pelvis on 07/30/2014 and ultrasound 05/23/2012  FINDINGS: Right Kidney:  Length: 12.8 cm. Small upper pole cyst is 0.9 x 0.8 x 1.0 cm. No hydronephrosis. Normal renal echogenicity.  Left Kidney:  Length: 11.6 cm. Echogenicity within normal limits. No mass or hydronephrosis visualized.  Bladder:  The bladder is partially decompressed by a Foley catheter.  IMPRESSION: 1. No hydronephrosis. 2. Stable right renal cyst.   Electronically Signed   By: Nolon Nations M.D.   On: 10/22/2014 10:42   Dg Chest Port 1 View  10/21/2014   CLINICAL DATA:  Shortness of breath.  EXAM: PORTABLE CHEST - 1 VIEW  COMPARISON:  September 18, 2014.  FINDINGS: The heart size and mediastinal contours are within normal limits. No pneumothorax or pleural effusion is noted. Status post Coronary artery bypass graft. No acute pulmonary disease is noted. The visualized skeletal structures are unremarkable.  IMPRESSION: No acute cardiopulmonary abnormality seen.   Electronically Signed   By: Marijo Conception, M.D.   On: 10/21/2014 08:55   Dg Abd Portable 2v  10/21/2014   CLINICAL DATA:  Vomiting.  EXAM: PORTABLE ABDOMEN - 2 VIEW  COMPARISON:  July 27, 2014.  FINDINGS: The bowel gas pattern is normal. There is no evidence of free air. No radio-opaque calculi or other significant radiographic abnormality is seen. Stool is noted in the rectum.  IMPRESSION: No evidence of bowel obstruction or ileus.   Electronically Signed   By: Marijo Conception, M.D.   On: 10/21/2014 08:52    Oren Binet, MD  [facility] Pager:[phone]  If 7PM-7AM, please contact night-coverage [email or web address] Password [email or web address] 10/24/2014, 11:17 AM   LOS: 4 days

## 2014-10-24 NOTE — Progress Notes (Signed)
[name] Progress Note   Subjective: No complaints, creat stable , good UOP  Filed Vitals:   10/23/14 1100 10/23/14 1121 10/23/14 2000 10/24/14 0500  BP:  137/76 118/54 134/57  Pulse:  83 84 80  Temp:  98.2 F (36.8 C) 98.8 F (37.1 C) 98.4 F (36.9 C)  TempSrc:  Oral Oral Oral  Resp:  17 18 17   Height: 5\' 7"  (1.702 m)     Weight: 70.625 kg (155 lb 11.2 oz)     SpO2:  100% 99% 98%   Exam: Chronically ill-appearing WF ,  NAD No jvd Chest clear bilat RRR no MRG, sternotomy scar Abd soft, NTND, no ascites or masses No fem bruit 1+ nonpitting LE edema bilat Neuro is nf, ox 3  Date Creat 2012 0.80- 2.15 20130.75- 1.55 20140.81- 1.72 20150.87- 1.50 Jan 7 - 11,20162.16 > 0.88  Renal US > 11-13 cm kidneys, on hydronephrosis.  UA 2/12 (lab) > 1.011, 5.0, 500 glu, 30 prot, mod LE/Hb 21-50wbc, 7-10rbc, rare epi, few bact, gran cast UA 2/13 (undersigned) > 1.010, 5.0, 1+LE, Hb, 1+protein, 1+, 20-30 WBC/ HPF, numerous granular casts , no cellular casts, abundant debris, no rbc's ECHO 11/15 > EF 30-35%, no WMA, grade 1 DD, reduced RV syst fxn CXR 2/12 no active dz  Assessment: 1. Acute kidney injury - due to ATN (gran casts on U/A) . Creat stable today and UOP good. Hold coreg, let BP come up for now. Daily wts. Labs in am. I agree to D/C foley cath 2. DKA - resolved 3. DM type 1 age of onset 27- sugar down to 125 this AM 4. AMS better 5. CKD - multiple prior episodes AKI related to DKA/ dehydration. Baseline creat 0.9-1.0- was 1.04 on October 09, 2014 6. UTI on rocephin 7. Systolic CHF - stable, EF 30-35%  Plan- as above-    Bridget Martinez A    10/24/2014, 9:08 AM     Recent Labs Lab 10/21/14 0050  10/22/14 0615 10/23/14 0413 10/24/14 0625  NA 142  < > 135 138 138   K 3.3*  < > 3.5 3.4* 3.8  CL 110  < > 106 106 105  CO2 6*  < > 14* 26 23  GLUCOSE 240*  < > 314* 225* 125*  BUN 23  < > 18 16 17   CREATININE 2.77*  < > 3.01* 2.77* 2.72*  CALCIUM 8.9  < > 7.7* 7.9* 8.1*  PHOS 6.2*  --   --  2.7 3.2  < > = values in this interval not displayed.  Recent Labs Lab 10/20/14 1951 10/23/14 0413 10/24/14 0625  AST 30  --   --   ALT 17  --   --   ALKPHOS 139*  --   --   BILITOT 2.1*  --   --   PROT 5.7*  --   --   ALBUMIN 2.8* 2.2* 2.2*    Recent Labs Lab 10/20/14 1951  10/22/14 0305 10/23/14 0413 10/24/14 0625  WBC 18.8*  --  8.6 6.2 5.3  NEUTROABS 16.2*  --   --   --   --   HGB 9.9*  < > 9.0* 8.7* 9.5*  HCT 31.6*  < > 26.2* 25.5* 27.9*  MCV 96.3  --  86.2 88.2 89.1  PLT 312  --  210 184 206  < > = values in this interval not displayed. Marland Kitchen aspirin EC  325 mg Oral Daily  . cefTRIAXone (ROCEPHIN)  IV  1 g Intravenous Q24H  . clopidogrel  75 mg Oral Q breakfast  . escitalopram  10 mg Oral Daily  . insulin aspart  0-9 Units Subcutaneous TID WC  . insulin aspart  6 Units Subcutaneous TID WC  . insulin detemir  10 Units Subcutaneous Daily  . levothyroxine  125 mcg Oral QAC breakfast  . metoCLOPramide (REGLAN) injection  10 mg Intravenous TID AC  . polyethylene glycol  17 g Oral BID  . rosuvastatin  40 mg Oral Daily     hyoscyamine, ondansetron (ZOFRAN) IV

## 2014-10-25 ENCOUNTER — Encounter (INDEPENDENT_AMBULATORY_CARE_PROVIDER_SITE_OTHER): Payer: BC Managed Care – PPO | Admitting: Ophthalmology

## 2014-10-25 LAB — RENAL FUNCTION PANEL
Albumin: 2.4 g/dL — ABNORMAL LOW (ref 3.5–5.2)
Anion gap: 13 (ref 5–15)
BUN: 18 mg/dL (ref 6–23)
CO2: 21 mmol/L (ref 19–32)
CREATININE: 2.68 mg/dL — AB (ref 0.50–1.10)
Calcium: 8.2 mg/dL — ABNORMAL LOW (ref 8.4–10.5)
Chloride: 103 mmol/L (ref 96–112)
GFR calc Af Amer: 20 mL/min — ABNORMAL LOW (ref >90)
GFR calc non Af Amer: 18 mL/min — ABNORMAL LOW (ref >90)
Glucose, Bld: 312 mg/dL — ABNORMAL HIGH (ref 70–99)
PHOSPHORUS: 3.1 mg/dL (ref 2.3–4.6)
Potassium: 3.5 mmol/L (ref 3.5–5.1)
Sodium: 137 mmol/L (ref 135–145)

## 2014-10-25 LAB — GLUCOSE, CAPILLARY
Glucose-Capillary: 366 mg/dL — ABNORMAL HIGH (ref 70–99)
Glucose-Capillary: 187 mg/dL — ABNORMAL HIGH (ref 70–99)

## 2014-10-25 MED ORDER — INSULIN DETEMIR 100 UNIT/ML ~~LOC~~ SOLN: 7.0000 [IU] | Freq: Every day | SUBCUTANEOUS | Status: DC

## 2014-10-25 NOTE — Progress Notes (Signed)
Discharge instructions reviewed with pt by [facility] student; allowing time for questions. Pt verbalized understanding. IVs removed without issue. Home health services setup through [place]. Pt leaving unit via wheelchair by student.

## 2014-10-25 NOTE — Progress Notes (Addendum)
CARE MANAGEMENT NOTE 10/25/2014  Patient:  ANALA, GALMORE   Account Number:  [id number]  Date Initiated:  10/21/2014  Documentation initiated by:  MAYO,HENRIETTA  Subjective/Objective Assessment:   dx DKA; lives with spouse    PCP [name] NP     Action/Plan:   Anticipated DC Date:  10/25/2014   Anticipated DC Plan:  [place]  CM consult      [facility] Choice  HOME HEALTH   Choice offered to / List presented to:  C-1 Patient        [place] arranged  [place]      [place].   Status of service:  Completed, signed off Medicare Important Message given?   (If response is "NO", the following Medicare IM given date fields will be blank) Date Medicare IM given:   Medicare IM given by:   Date Additional Medicare IM given:   Additional Medicare IM given by:    Discharge Disposition:  [place]  Per UR Regulation:  Reviewed for med. necessity/level of care/duration of stay  If discussed at [place] of Stay Meetings, dates discussed:    Comments:  10/25/2014 1120 NCM spoke to pt and offered choice for [facility]. Pt agreeable to [facility] for HH. States she has RW and glucometer at home. Lives at home with husband. Jonnie Finner RN CCM Case Mgmt phone [phone]

## 2014-10-25 NOTE — Progress Notes (Signed)
[facility] Progress Note   Subjective: No complaints, creat stable , good UOP  Filed Vitals:   10/24/14 1704 10/24/14 2100 10/25/14 0500 10/25/14 0940  BP: 130/53 149/62 141/49 142/49  Pulse: 85 96 87 96  Temp: 98.4 F (36.9 C) 98.2 F (36.8 C) 98.4 F (36.9 C) 99.5 F (37.5 C)  TempSrc: Oral Oral Oral Oral  Resp: 17 18 16 16   Height:      Weight:  69.5 kg (153 lb 3.5 oz)    SpO2: 99% 95% 97% 96%   Exam: Chronically ill-appearing WF ,  NAD No jvd Chest clear bilat RRR no MRG, sternotomy scar Abd soft, NTND, no ascites or masses No fem bruit 1+ nonpitting LE edema bilat Neuro is nf, ox 3  Date Creat 2012 0.80- 2.15 20130.75- 1.55 20140.81- 1.72 20150.87- 1.50 Jan 7 - 11,20162.16 > 0.88  Renal US > 11-13 cm kidneys, on hydronephrosis.  UA 2/12 (lab) > 1.011, 5.0, 500 glu, 30 prot, mod LE/Hb 21-50wbc, 7-10rbc, rare epi, few bact, gran cast UA 2/13 (undersigned) > 1.010, 5.0, 1+LE, Hb, 1+protein, 1+, 20-30 WBC/ HPF, numerous granular casts , no cellular casts, abundant debris, no rbc's ECHO 11/15 > EF 30-35%, no WMA, grade 1 DD, reduced RV syst fxn CXR 2/12 no active dz  Assessment: 1. Acute kidney injury - due to ATN (gran casts on U/A) . Creat stable today and UOP good. Primary team ready to send patient home today as DKA is resolved.  Creatinine not quite down to baseline- unknown if permanent damage was done with this latest episode.  Will need follow up with [facility] 2. DKA - resolved 3. DM type 1 age of onset 89- sugar down to 125 this AM 4. AMS better 5. CKD - multiple prior episodes AKI related to DKA/ dehydration. Baseline creat 0.9-1.0- was 1.04 on October 09, 2014 6. UTI on rocephin 7. Systolic CHF - stable, EF 30-35%  Follow up appt made with me on Thursday March 17th  at 9 AM - will put on discharge paperwork.  I am Ok with discharge home today    [place] A    10/25/2014, 9:44 AM     Recent Labs Lab 10/23/14 0413 10/24/14 0625 10/25/14 0552  NA 138 138 137  K 3.4* 3.8 3.5  CL 106 105 103  CO2 26 23 21   GLUCOSE 225* 125* 312*  BUN 16 17 18   CREATININE 2.77* 2.72* 2.68*  CALCIUM 7.9* 8.1* 8.2*  PHOS 2.7 3.2 3.1    Recent Labs Lab 10/20/14 1951 10/23/14 0413 10/24/14 0625 10/25/14 0552  AST 30  --   --   --   ALT 17  --   --   --   ALKPHOS 139*  --   --   --   BILITOT 2.1*  --   --   --   PROT 5.7*  --   --   --   ALBUMIN 2.8* 2.2* 2.2* 2.4*    Recent Labs Lab 10/20/14 1951  10/22/14 0305 10/23/14 0413 10/24/14 0625  WBC 18.8*  --  8.6 6.2 5.3  NEUTROABS 16.2*  --   --   --   --   HGB 9.9*  < > 9.0* 8.7* 9.5*  HCT 31.6*  < > 26.2* 25.5* 27.9*  MCV 96.3  --  86.2 88.2 89.1  PLT 312  --  210 184 206  < > = values in this interval not displayed. Marland Kitchen aspirin EC  325  mg Oral Daily  . clopidogrel  75 mg Oral Q breakfast  . escitalopram  10 mg Oral Daily  . insulin aspart  0-9 Units Subcutaneous TID WC  . insulin aspart  4 Units Subcutaneous TID WC  . insulin detemir  8 Units Subcutaneous Daily  . levothyroxine  125 mcg Oral QAC breakfast  . metoCLOPramide (REGLAN) injection  10 mg Intravenous TID AC  . polyethylene glycol  17 g Oral BID  . rosuvastatin  40 mg Oral Daily     hyoscyamine, ondansetron (ZOFRAN) IV

## 2014-10-25 NOTE — Progress Notes (Signed)
UR Completed.  [phone]  

## 2014-10-25 NOTE — Discharge Summary (Signed)
PATIENT DETAILS Name: Bridget Martinez Age: 64 y.o. Sex: female Date of Birth: [birth year] MRN: [id number]. Admitting Physician: Allyne Gee, MD PO:[id number] MARGARET, FNP  Admit Date: 10/20/2014 Discharge date: 10/25/2014  Recommendations for Outpatient Follow-up:  1. Very brittle diabetes, please continue to adjust insulin regimen. 2. New onset renal failure-creatinine still significantly elevated-please continue to check renal panel closely as outpatient.  PRIMARY DISCHARGE DIAGNOSIS:  Active Problems:   Hyperlipidemia   DKA (diabetic ketoacidoses)   Hypertension   CKD (chronic kidney disease), stage III   Altered mental status      PAST MEDICAL HISTORY: Past Medical History  Diagnosis Date  . Diabetes mellitus     on insulin, with h/o DKA   . HTN (hypertension)   . Hyperlipidemia   . Venous stasis ulcers   . Arthritis   . Depression     anxiety  . 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 Hypertension   . Coronary artery disease     angina.  MI.   . Pneumonia        . GERD (gastroesophageal reflux disease)     Hiatal hernia.   . Hypothyroidism   . CHF (congestive heart failure)   . Tuberculosis   . Osteoporosis     DISCHARGE MEDICATIONS: Current Discharge Medication List    CONTINUE these medications which have CHANGED   Details  insulin detemir (LEVEMIR) 100 UNIT/ML injection Inject 0.07 mLs (7 Units total) into the skin daily. Qty: 10 mL, Refills: 0      CONTINUE these medications which have NOT CHANGED   Details  carvedilol (COREG) 6.25 MG tablet Take 1 tablet (6.25 mg total) by mouth 2 (two) times daily with a meal. Qty: 60 tablet, Refills: 2    clopidogrel (PLAVIX) 75 MG tablet Take 1 tablet (75 mg total) by mouth daily with breakfast. Qty: 30 tablet, Refills: 2    insulin aspart (NOVOLOG) 100 UNIT/ML injection Inject 3 Units into the skin 3 (three) times daily with meals. Qty: 10 mL, Refills:  11    levothyroxine (SYNTHROID, LEVOTHROID) 125 MCG tablet Take 1 tablet (125 mcg total) by mouth daily. Qty: 30 tablet, Refills: 11   Associated Diagnoses: Unspecified hypothyroidism    metoCLOPramide (REGLAN) 10 MG tablet Take 1 tablet (10 mg total) by mouth 4 (four) times daily -  before meals and at bedtime. Qty: 60 tablet, Refills: 1    ondansetron (ZOFRAN ODT) 8 MG disintegrating tablet Take 1 tablet (8 mg total) by mouth every 8 (eight) hours as needed for nausea or vomiting. Qty: 20 tablet, Refills: 1    pantoprazole (PROTONIX) 40 MG tablet Take 1 tablet (40 mg total) by mouth daily. Qty: 30 tablet, Refills: 3    rosuvastatin (CRESTOR) 40 MG tablet Take 1 tablet (40 mg total) by mouth daily. Qty: 30 tablet, Refills: 11   Associated Diagnoses: Other and unspecified hyperlipidemia    Vitamin D, Ergocalciferol, (DRISDOL) 50000 UNITS CAPS capsule Take 1 capsule by mouth every Saturday.     aspirin EC 81 MG tablet Take 1 tablet (81 mg total) by mouth daily. Qty: 30 tablet, Refills: 3    escitalopram (LEXAPRO) 10 MG tablet Take 1 tablet (10 mg total) by mouth daily. Qty: 30 tablet, Refills: 11   Associated Diagnoses: Depression      STOP taking these medications     aspirin 325 MG tablet      furosemide (LASIX) 40  MG tablet      spironolactone (ALDACTONE) 25 MG tablet      TRESIBA FLEXTOUCH 100 UNIT/ML SOPN      hyoscyamine (LEVSIN, ANASPAZ) 0.125 MG tablet         ALLERGIES:   Allergies  Allergen Reactions  . Sulfa Antibiotics Anaphylaxis and Swelling    Stiff neck also     BRIEF HPI:  See H&P, Labs, Consult and Test reports for all details in brief, patient was admitted for altered mental status and severe diabetic ketoacidosis. She was also found to be in acute renal failure. She was admitted for further evaluation and treatment.  CONSULTATIONS:   nephrology  PERTINENT RADIOLOGIC STUDIES: US Renal  10/22/2014   CLINICAL DATA:  Acute renal failure.   EXAM: RENAL/URINARY TRACT ULTRASOUND COMPLETE  COMPARISON:  CT of the abdomen and pelvis on 07/30/2014 and ultrasound 05/23/2012  FINDINGS: Right Kidney:  Length: 12.8 cm. Small upper pole cyst is 0.9 x 0.8 x 1.0 cm. No hydronephrosis. Normal renal echogenicity.  Left Kidney:  Length: 11.6 cm. Echogenicity within normal limits. No mass or hydronephrosis visualized.  Bladder:  The bladder is partially decompressed by a Foley catheter.  IMPRESSION: 1. No hydronephrosis. 2. Stable right renal cyst.   Electronically Signed   By: Nolon Nations M.D.   On: 10/22/2014 10:42   Dg Chest Port 1 View  10/21/2014   CLINICAL DATA:  Shortness of breath.  EXAM: PORTABLE CHEST - 1 VIEW  COMPARISON:  September 18, 2014.  FINDINGS: The heart size and mediastinal contours are within normal limits. No pneumothorax or pleural effusion is noted. Status post Coronary artery bypass graft. No acute pulmonary disease is noted. The visualized skeletal structures are unremarkable.  IMPRESSION: No acute cardiopulmonary abnormality seen.   Electronically Signed   By: Marijo Conception, M.D.   On: 10/21/2014 08:55   Dg Abd Portable 2v  10/21/2014   CLINICAL DATA:  Vomiting.  EXAM: PORTABLE ABDOMEN - 2 VIEW  COMPARISON:  July 27, 2014.  FINDINGS: The bowel gas pattern is normal. There is no evidence of free air. No radio-opaque calculi or other significant radiographic abnormality is seen. Stool is noted in the rectum.  IMPRESSION: No evidence of bowel obstruction or ileus.   Electronically Signed   By: Marijo Conception, M.D.   On: 10/21/2014 08:52     PERTINENT LAB RESULTS: CBC:  Recent Labs  10/23/14 0413 10/24/14 0625  WBC 6.2 5.3  HGB 8.7* 9.5*  HCT 25.5* 27.9*  PLT 184 206   CMET CMP     Component Value Date/Time   NA 137 10/25/2014 0552   NA 136 11/10/2013 1404   K 3.5 10/25/2014 0552   CL 103 10/25/2014 0552   CO2 21 10/25/2014 0552   GLUCOSE 312* 10/25/2014 0552   GLUCOSE 516* 11/10/2013 1404   BUN 18  10/25/2014 0552   BUN 25 11/10/2013 1404   CREATININE 2.68* 10/25/2014 0552   CALCIUM 8.2* 10/25/2014 0552   PROT 5.7* 10/20/2014 1951   PROT 6.1 11/10/2013 1404   ALBUMIN 2.4* 10/25/2014 0552   AST 30 10/20/2014 1951   ALT 17 10/20/2014 1951   ALKPHOS 139* 10/20/2014 1951   BILITOT 2.1* 10/20/2014 1951   GFRNONAA 18* 10/25/2014 0552   GFRAA 20* 10/25/2014 0552    GFR Estimated Creatinine Clearance: 20.6 mL/min (by C-G formula based on Cr of 2.68). No results for input(s): LIPASE, AMYLASE in the last 72 hours. No results for input(s):  CKTOTAL, CKMB, CKMBINDEX, TROPONINI in the last 72 hours. Invalid input(s): POCBNP No results for input(s): DDIMER in the last 72 hours. No results for input(s): HGBA1C in the last 72 hours. No results for input(s): CHOL, HDL, LDLCALC, TRIG, CHOLHDL, LDLDIRECT in the last 72 hours. No results for input(s): TSH, T4TOTAL, T3FREE, THYROIDAB in the last 72 hours.  Invalid input(s): FREET3 No results for input(s): VITAMINB12, FOLATE, FERRITIN, TIBC, IRON, RETICCTPCT in the last 72 hours. Coags: No results for input(s): INR in the last 72 hours.  Invalid input(s): PT Microbiology: Recent Results (from the past 240 hour(s))  MRSA PCR Screening     Status: None   Collection Time: 10/20/14 11:26 PM  Result Value Ref Range Status   MRSA by PCR NEGATIVE NEGATIVE Final    Comment:        The GeneXpert MRSA Assay (FDA approved for NASAL specimens only), is one component of a comprehensive MRSA colonization surveillance program. It is not intended to diagnose MRSA infection nor to guide or monitor treatment for MRSA infections.   Culture, blood (routine x 2)     Status: None (Preliminary result)   Collection Time: 10/21/14 12:50 AM  Result Value Ref Range Status   Specimen Description BLOOD LEFT ANTECUBITAL  Final   Special Requests BOTTLES DRAWN AEROBIC AND ANAEROBIC 3CC  Final   Culture   Final           BLOOD CULTURE RECEIVED NO GROWTH TO DATE  CULTURE WILL BE HELD FOR 5 DAYS BEFORE ISSUING A FINAL NEGATIVE REPORT Performed at [facility]    Report Status PENDING  Incomplete  Culture, blood (routine x 2)     Status: None (Preliminary result)   Collection Time: 10/21/14 12:55 AM  Result Value Ref Range Status   Specimen Description BLOOD LEFT HAND  Final   Special Requests BOTTLES DRAWN AEROBIC AND ANAEROBIC 5CC  Final   Culture   Final           BLOOD CULTURE RECEIVED NO GROWTH TO DATE CULTURE WILL BE HELD FOR 5 DAYS BEFORE ISSUING A FINAL NEGATIVE REPORT Performed at [facility]    Report Status PENDING  Incomplete  Urine culture     Status: None   Collection Time: 10/22/14 12:20 PM  Result Value Ref Range Status   Specimen Description URINE, CATHETERIZED  Final   Special Requests NONE  Final   Colony Count NO GROWTH Performed at [facility]   Final   Culture NO GROWTH Performed at [facility]   Final   Report Status 10/23/2014 FINAL  Final     BRIEF HOSPITAL COURSE:  Diabetic ketoacidosis: Admitted with DKA with severe metabolic acidosis. Started on IV fluids and IV insulin, once anion gap closed, patient was transitioned off IV insulin to Levemir and SSI   Acute encephalopathy: Likely metabolic encephalopathy secondary to acute on chronic and he disease and severe DKA.Resolved with supportive care, IV Insulin.   Uncontrolled diabetes: Last A1c in 10/21/14 was 10.5. Transitioned to Levemir and NovoLog.-CBGs seem to be fluctuating, appears to have brittle diabetes with both hypo-and hyperglycemia in the same day. Patient will continue on Levemir and NovoLog, I have scheduled her a follow-up appointment with her primary endocrinologist on 2/18. Defer further changes to her insulin regimen to Dr. Buddy Duty.   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 Renal ultrasound without  hydronephroses. Unfortunately her creatinine has plateaued to around 2.6 range. No further recommendations from nephrology while inpatient, recommendations are for close outpatient follow-up, outpatient appointment has been arranged.   Metabolic Acidosis:from DKA and ARF. Metabolic acidosis persisted even after Anion gap closed, this was suspected due to acute renal failure. Patient was placed on IV fluid with bicarbonate, thankfully bicarbonate has now normalized.    UTI:on Rocephin since 2/11, since urine cs neg- stopped on 2/15   Leukocytosis:Resolved, secondary to UTI.   Vomiting: Likely secondary to underlying gastroparesis. Abdomen is soft. Abdominal x-ray on 2/12 negative for acute abnormalities. Continue with Reglan. Much better, advanced diet to regular diet-I've counseled regarding small portion meals. Further vomiting, tolerating diet well at the time of discharge.   Chronic systolic heart failure: Currently clinically compensated, last echocardiogram on 07/26/14 showed EF around 30-35%. Resume beta blocker, not a candidate for Ace/ARB given renal failure   Hyperlipidemia: Continue with statins   Hypertension: continue Coreg   TODAY-DAY OF DISCHARGE:  Subjective:   Heera Wenig today has no headache,no chest abdominal pain,no new weakness tingling or numbness, feels much better wants to go home today.   Objective:   Blood pressure 142/49, pulse 96, temperature 99.5 F (37.5 C), temperature source Oral, resp. rate 16, height 5\' 7"  (1.702 m), weight 69.5 kg (153 lb 3.5 oz), SpO2 96 %.  Intake/Output Summary (Last 24 hours) at 10/25/14 1053 Last data filed at 10/25/14 1025  Gross per 24 hour  Intake    840 ml  Output   1176 ml  Net   -336 ml   Filed Weights   10/20/14 1959 10/23/14 1100 10/24/14 2100  Weight: 65.772 kg (145 lb) 70.625 kg (155 lb 11.2 oz) 69.5 kg (153 lb 3.5 oz)    Exam Awake Alert, Oriented *3, No new F.N deficits, Normal  affect Interior.AT,PERRAL Supple Neck,No JVD, No cervical lymphadenopathy appriciated.  Symmetrical Chest wall movement, Good air movement bilaterally, CTAB RRR,No Gallops,Rubs or new Murmurs, No Parasternal Heave +ve B.Sounds, Abd Soft, Non tender, No organomegaly appriciated, No rebound -guarding or rigidity. No Cyanosis, Clubbing or edema, No new Rash or bruise  DISCHARGE CONDITION: Stable  DISPOSITION: Home with home health services  DISCHARGE INSTRUCTIONS:    Activity:  As tolerated   Diet recommendation: Diabetic Diet Heart Healthy diet  Discharge Instructions    Call MD for:  persistant nausea and vomiting    Complete by:  As directed      Call MD for:  severe uncontrolled pain    Complete by:  As directed      Diet - low sodium heart healthy    Complete by:  As directed      Diet Carb Modified    Complete by:  As directed      Increase activity slowly    Complete by:  As directed            Follow-up Information    Follow up with Louis Meckel, MD On 11/24/2014.   Specialty:  Nephrology   Why:  Appt at [facility] is at 9 AM- please be there by 8:45   Contact information:   [place] [address] [phone]       Follow up with KERR,JEFFREY, MD On 10/27/2014.   Specialty:  Endocrinology   Why:  appointment at 10:40 am   Contact information:   [address] [place] [address] [phone]       Follow up with  [name], FNP. Schedule an appointment as soon as possible for a visit in 1 week.   Specialty:  Nurse Practitioner   Contact information:   [place] [address] [phone]       Total Time spent on discharge equals 45 minutes.  SignedOren Binet 10/25/2014 10:53 AM

## 2014-10-26 ENCOUNTER — Inpatient Hospital Stay (HOSPITAL_COMMUNITY): Payer: BC Managed Care – PPO

## 2014-10-26 ENCOUNTER — Inpatient Hospital Stay (HOSPITAL_COMMUNITY)
Admission: EM | Admit: 2014-10-26 | Discharge: 2014-11-05 | Disposition: A | Discharge status: Skilled Nursing Facility | Payer: BC Managed Care – PPO | Source: Home / Self Care | Attending: Internal Medicine | Admitting: Internal Medicine

## 2014-10-26 ENCOUNTER — Encounter (HOSPITAL_COMMUNITY): Payer: Self-pay

## 2014-10-26 DIAGNOSIS — Z794 Long term (current) use of insulin: Secondary | ICD-10-CM

## 2014-10-26 DIAGNOSIS — I5043 Acute on chronic combined systolic (congestive) and diastolic (congestive) heart failure: Secondary | ICD-10-CM | POA: Diagnosis present

## 2014-10-26 DIAGNOSIS — I252 Old myocardial infarction: Secondary | ICD-10-CM

## 2014-10-26 DIAGNOSIS — Z6828 Body mass index (BMI) 28.0-28.9, adult: Secondary | ICD-10-CM

## 2014-10-26 DIAGNOSIS — I447 Left bundle-branch block, unspecified: Secondary | ICD-10-CM

## 2014-10-26 DIAGNOSIS — I959 Hypotension, unspecified: Secondary | ICD-10-CM

## 2014-10-26 DIAGNOSIS — R6510 Systemic inflammatory response syndrome (SIRS) of non-infectious origin without acute organ dysfunction: Secondary | ICD-10-CM | POA: Diagnosis present

## 2014-10-26 DIAGNOSIS — M810 Age-related osteoporosis without current pathological fracture: Secondary | ICD-10-CM | POA: Diagnosis present

## 2014-10-26 DIAGNOSIS — D649 Anemia, unspecified: Secondary | ICD-10-CM | POA: Diagnosis present

## 2014-10-26 DIAGNOSIS — E876 Hypokalemia: Secondary | ICD-10-CM | POA: Diagnosis not present

## 2014-10-26 DIAGNOSIS — E875 Hyperkalemia: Secondary | ICD-10-CM | POA: Diagnosis present

## 2014-10-26 DIAGNOSIS — F329 Major depressive disorder, single episode, unspecified: Secondary | ICD-10-CM | POA: Diagnosis present

## 2014-10-26 DIAGNOSIS — K3184 Gastroparesis: Secondary | ICD-10-CM

## 2014-10-26 DIAGNOSIS — E43 Unspecified severe protein-calorie malnutrition: Secondary | ICD-10-CM | POA: Diagnosis present

## 2014-10-26 DIAGNOSIS — E1065 Type 1 diabetes mellitus with hyperglycemia: Secondary | ICD-10-CM

## 2014-10-26 DIAGNOSIS — E1011 Type 1 diabetes mellitus with ketoacidosis with coma: Secondary | ICD-10-CM

## 2014-10-26 DIAGNOSIS — Z951 Presence of aortocoronary bypass graft: Secondary | ICD-10-CM

## 2014-10-26 DIAGNOSIS — G9341 Metabolic encephalopathy: Secondary | ICD-10-CM

## 2014-10-26 DIAGNOSIS — K30 Functional dyspepsia: Secondary | ICD-10-CM | POA: Diagnosis present

## 2014-10-26 DIAGNOSIS — K449 Diaphragmatic hernia without obstruction or gangrene: Secondary | ICD-10-CM

## 2014-10-26 DIAGNOSIS — I214 Non-ST elevation (NSTEMI) myocardial infarction: Secondary | ICD-10-CM | POA: Diagnosis present

## 2014-10-26 DIAGNOSIS — E1049 Type 1 diabetes mellitus with other diabetic neurological complication: Secondary | ICD-10-CM | POA: Diagnosis present

## 2014-10-26 DIAGNOSIS — I130 Hypertensive heart and chronic kidney disease with heart failure and stage 1 through stage 4 chronic kidney disease, or unspecified chronic kidney disease: Secondary | ICD-10-CM | POA: Diagnosis present

## 2014-10-26 DIAGNOSIS — E1110 Type 2 diabetes mellitus with ketoacidosis without coma: Secondary | ICD-10-CM | POA: Diagnosis present

## 2014-10-26 DIAGNOSIS — E785 Hyperlipidemia, unspecified: Secondary | ICD-10-CM | POA: Diagnosis present

## 2014-10-26 DIAGNOSIS — N189 Chronic kidney disease, unspecified: Secondary | ICD-10-CM | POA: Diagnosis present

## 2014-10-26 DIAGNOSIS — D638 Anemia in other chronic diseases classified elsewhere: Secondary | ICD-10-CM | POA: Diagnosis present

## 2014-10-26 DIAGNOSIS — N179 Acute kidney failure, unspecified: Secondary | ICD-10-CM | POA: Diagnosis present

## 2014-10-26 DIAGNOSIS — I272 Other secondary pulmonary hypertension: Secondary | ICD-10-CM | POA: Diagnosis present

## 2014-10-26 DIAGNOSIS — I5023 Acute on chronic systolic (congestive) heart failure: Secondary | ICD-10-CM | POA: Diagnosis present

## 2014-10-26 DIAGNOSIS — E869 Volume depletion, unspecified: Secondary | ICD-10-CM | POA: Diagnosis present

## 2014-10-26 DIAGNOSIS — I2510 Atherosclerotic heart disease of native coronary artery without angina pectoris: Secondary | ICD-10-CM | POA: Diagnosis present

## 2014-10-26 DIAGNOSIS — E0810 Diabetes mellitus due to underlying condition with ketoacidosis without coma: Secondary | ICD-10-CM

## 2014-10-26 DIAGNOSIS — Z79899 Other long term (current) drug therapy: Secondary | ICD-10-CM

## 2014-10-26 DIAGNOSIS — Z7902 Long term (current) use of antithrombotics/antiplatelets: Secondary | ICD-10-CM

## 2014-10-26 DIAGNOSIS — Z7982 Long term (current) use of aspirin: Secondary | ICD-10-CM

## 2014-10-26 DIAGNOSIS — K219 Gastro-esophageal reflux disease without esophagitis: Secondary | ICD-10-CM | POA: Diagnosis present

## 2014-10-26 DIAGNOSIS — B3789 Other sites of candidiasis: Secondary | ICD-10-CM | POA: Diagnosis present

## 2014-10-26 DIAGNOSIS — E1010 Type 1 diabetes mellitus with ketoacidosis without coma: Secondary | ICD-10-CM | POA: Diagnosis present

## 2014-10-26 DIAGNOSIS — E1043 Type 1 diabetes mellitus with diabetic autonomic (poly)neuropathy: Secondary | ICD-10-CM | POA: Diagnosis present

## 2014-10-26 DIAGNOSIS — E10649 Type 1 diabetes mellitus with hypoglycemia without coma: Secondary | ICD-10-CM

## 2014-10-26 DIAGNOSIS — E039 Hypothyroidism, unspecified: Secondary | ICD-10-CM

## 2014-10-26 DIAGNOSIS — G9340 Encephalopathy, unspecified: Secondary | ICD-10-CM | POA: Diagnosis present

## 2014-10-26 DIAGNOSIS — IMO0002 Reserved for concepts with insufficient information to code with codable children: Secondary | ICD-10-CM | POA: Diagnosis present

## 2014-10-26 DIAGNOSIS — D539 Nutritional anemia, unspecified: Secondary | ICD-10-CM

## 2014-10-26 LAB — CBC WITH DIFFERENTIAL/PLATELET
BASOS PCT: 0 % (ref 0–1)
Basophils Absolute: 0 10*3/uL (ref 0.0–0.1)
EOS PCT: 0 % (ref 0–5)
Eosinophils Absolute: 0 10*3/uL (ref 0.0–0.7)
HCT: 33.1 % — ABNORMAL LOW (ref 36.0–46.0)
HEMOGLOBIN: 9.8 g/dL — AB (ref 12.0–15.0)
LYMPHS ABS: 0.7 10*3/uL (ref 0.7–4.0)
Lymphocytes Relative: 8 % — ABNORMAL LOW (ref 12–46)
MCH: 30.2 pg (ref 26.0–34.0)
MCHC: 29.6 g/dL — ABNORMAL LOW (ref 30.0–36.0)
MCV: 102.2 fL — AB (ref 78.0–100.0)
Monocytes Absolute: 0.3 10*3/uL (ref 0.1–1.0)
Monocytes Relative: 3 % (ref 3–12)
Neutro Abs: 7.7 10*3/uL (ref 1.7–7.7)
Neutrophils Relative %: 89 % — ABNORMAL HIGH (ref 43–77)
Platelets: 358 10*3/uL (ref 150–400)
RBC: 3.24 MIL/uL — AB (ref 3.87–5.11)
RDW: 15.3 % (ref 11.5–15.5)
WBC: 8.7 10*3/uL (ref 4.0–10.5)

## 2014-10-26 LAB — BASIC METABOLIC PANEL
Anion gap: 31 — ABNORMAL HIGH (ref 5–15)
Anion gap: 10 (ref 5–15)
BUN: 40 mg/dL — ABNORMAL HIGH (ref 6–23)
BUN: 30 mg/dL — AB (ref 6–23)
CALCIUM: 7.2 mg/dL — AB (ref 8.4–10.5)
CHLORIDE: 94 mmol/L — AB (ref 96–112)
CO2: 6 mmol/L — CL (ref 19–32)
CO2: 14 mmol/L — AB (ref 19–32)
Calcium: 8.4 mg/dL (ref 8.4–10.5)
Chloride: 120 mmol/L — ABNORMAL HIGH (ref 96–112)
Creatinine, Ser: 3.47 mg/dL — ABNORMAL HIGH (ref 0.50–1.10)
Creatinine, Ser: 2.61 mg/dL — ABNORMAL HIGH (ref 0.50–1.10)
GFR calc Af Amer: 15 mL/min — ABNORMAL LOW (ref >90)
GFR calc Af Amer: 21 mL/min — ABNORMAL LOW (ref >90)
GFR calc non Af Amer: 13 mL/min — ABNORMAL LOW (ref >90)
GFR, EST NON AFRICAN AMERICAN: 18 mL/min — AB (ref >90)
GLUCOSE: 1096 mg/dL — AB (ref 70–99)
Glucose, Bld: 182 mg/dL — ABNORMAL HIGH (ref 70–99)
POTASSIUM: 6.2 mmol/L — AB (ref 3.5–5.1)
Potassium: 2.9 mmol/L — ABNORMAL LOW (ref 3.5–5.1)
SODIUM: 144 mmol/L (ref 135–145)
Sodium: 131 mmol/L — ABNORMAL LOW (ref 135–145)

## 2014-10-26 LAB — URINALYSIS, ROUTINE W REFLEX MICROSCOPIC
BILIRUBIN URINE: NEGATIVE
Glucose, UA: >1000 mg/dL — AB
Ketones, ur: >80 mg/dL — AB
Leukocytes, UA: NEGATIVE
Nitrite: NEGATIVE
PH: 5 (ref 5.0–8.0)
Protein, ur: NEGATIVE mg/dL
Specific Gravity, Urine: 1.019 (ref 1.005–1.030)
Urobilinogen, UA: 0.2 mg/dL (ref 0.0–1.0)

## 2014-10-26 LAB — GLUCOSE, CAPILLARY
GLUCOSE-CAPILLARY: 539 mg/dL — AB (ref 70–99)
GLUCOSE-CAPILLARY: 395 mg/dL — AB (ref 70–99)

## 2014-10-26 LAB — BLOOD GAS, VENOUS
ACID-BASE DEFICIT: 25.7 mmol/L — AB (ref 0.0–2.0)
Bicarbonate: 4.8 mEq/L — ABNORMAL LOW (ref 20.0–24.0)
O2 SAT: 94.4 %
PCO2 VEN: 22 mmHg — AB (ref 45.0–50.0)
Patient temperature: 98.6
TCO2: 5.1 mmol/L (ref 0–100)
pH, Ven: 6.973 — CL (ref 7.250–7.300)
pO2, Ven: 120 mmHg — ABNORMAL HIGH (ref 30.0–45.0)

## 2014-10-26 LAB — CBG MONITORING, ED
Glucose-Capillary: >600 mg/dL (ref 70–99)
Glucose-Capillary: >600 mg/dL (ref 70–99)

## 2014-10-26 LAB — URINE MICROSCOPIC-ADD ON

## 2014-10-26 LAB — BLOOD GAS, ARTERIAL
Acid-base deficit: 23.7 mmol/L — ABNORMAL HIGH (ref 0.0–2.0)
Bicarbonate: 5.4 mEq/L — ABNORMAL LOW (ref 20.0–24.0)
Drawn by: 257701
O2 SAT: 96.2 %
PCO2 ART: 20.9 mmHg — AB (ref 35.0–45.0)
Patient temperature: 98.6
TCO2: 5.6 mmol/L (ref 0–100)
pH, Arterial: 7.043 — CL (ref 7.350–7.450)
pO2, Arterial: 124 mmHg — ABNORMAL HIGH (ref 80.0–100.0)

## 2014-10-26 LAB — PROCALCITONIN: Procalcitonin: 2.64 ng/mL

## 2014-10-26 LAB — PROTIME-INR
INR: 1.18 (ref 0.00–1.49)
PROTHROMBIN TIME: 15.2 s (ref 11.6–15.2)

## 2014-10-26 LAB — KETONES, QUALITATIVE

## 2014-10-26 LAB — LACTIC ACID, PLASMA: Lactic Acid, Venous: 4.1 mmol/L (ref 0.5–2.0)

## 2014-10-26 LAB — TROPONIN I
Troponin I: 0.07 ng/mL — ABNORMAL HIGH (ref <0.031)
Troponin I: 6.02 ng/mL (ref <0.031)

## 2014-10-26 LAB — AMYLASE: Amylase: 335 U/L — ABNORMAL HIGH (ref 0–105)

## 2014-10-26 LAB — MRSA PCR SCREENING: MRSA BY PCR: NEGATIVE

## 2014-10-26 LAB — LIPASE, BLOOD: Lipase: 554 U/L — ABNORMAL HIGH (ref 11–59)

## 2014-10-26 MED ORDER — SODIUM CHLORIDE 0.9 % IV SOLN
INTRAVENOUS | Status: DC
  Administered 2014-10-26: 19:00:00 via INTRAVENOUS

## 2014-10-26 MED ORDER — SODIUM CHLORIDE 0.9 % IV SOLN
INTRAVENOUS | Status: DC
  Administered 2014-10-26: 5.4 [IU]/h via INTRAVENOUS
  Filled 2014-10-26: qty 2.5

## 2014-10-26 MED ORDER — FLUCONAZOLE 100MG IVPB
100.0000 mg | Freq: Once | INTRAVENOUS | Status: AC
  Administered 2014-10-26: 100 mg via INTRAVENOUS
  Filled 2014-10-26: qty 50

## 2014-10-26 MED ORDER — SODIUM CHLORIDE 0.9 % IV BOLUS (SEPSIS)
1000.0000 mL | Freq: Once | INTRAVENOUS | Status: AC
  Administered 2014-10-26: 1000 mL via INTRAVENOUS

## 2014-10-26 MED ORDER — VANCOMYCIN HCL 10 G IV SOLR
1250.0000 mg | Freq: Once | INTRAVENOUS | Status: AC
  Administered 2014-10-26: 1250 mg via INTRAVENOUS
  Filled 2014-10-26: qty 1250

## 2014-10-26 MED ORDER — FLUCONAZOLE 100MG IVPB
50.0000 mg | INTRAVENOUS | Status: DC
  Filled 2014-10-26: qty 25

## 2014-10-26 MED ORDER — SODIUM CHLORIDE 0.9 % IV SOLN
1.0000 g | Freq: Once | INTRAVENOUS | Status: AC
  Administered 2014-10-26: 1 g via INTRAVENOUS
  Filled 2014-10-26: qty 10

## 2014-10-26 MED ORDER — HEPARIN SODIUM (PORCINE) 5000 UNIT/ML IJ SOLN
5000.0000 [IU] | Freq: Three times a day (TID) | INTRAMUSCULAR | Status: DC
  Administered 2014-10-26: 5000 [IU] via SUBCUTANEOUS
  Filled 2014-10-26 (×3): qty 1

## 2014-10-26 MED ORDER — SODIUM CHLORIDE 0.9 % IV SOLN
INTRAVENOUS | Status: DC | PRN
  Administered 2014-10-29: 10 mL/h via INTRA_ARTERIAL

## 2014-10-26 MED ORDER — DEXTROSE-NACL 5-0.45 % IV SOLN
INTRAVENOUS | Status: DC
  Administered 2014-10-26 (×2): via INTRAVENOUS

## 2014-10-26 MED ORDER — PANTOPRAZOLE SODIUM 40 MG IV SOLR
40.0000 mg | INTRAVENOUS | Status: DC
  Administered 2014-10-26: 40 mg via INTRAVENOUS
  Filled 2014-10-26: qty 40

## 2014-10-26 MED ORDER — DEXTROSE 50 % IV SOLN: 25.0000 mL | INTRAVENOUS | Status: DC | PRN

## 2014-10-26 MED ORDER — SODIUM CHLORIDE 0.9 % IV SOLN
INTRAVENOUS | Status: AC
  Administered 2014-10-26: 16:00:00 via INTRAVENOUS

## 2014-10-26 MED ORDER — DEXTROSE-NACL 5-0.45 % IV SOLN: INTRAVENOUS | Status: DC

## 2014-10-26 MED ORDER — ALBUTEROL SULFATE (2.5 MG/3ML) 0.083% IN NEBU
5.0000 mg | INHALATION_SOLUTION | Freq: Once | RESPIRATORY_TRACT | Status: AC
  Administered 2014-10-26: 5 mg via RESPIRATORY_TRACT
  Filled 2014-10-26: qty 6

## 2014-10-26 MED ORDER — VANCOMYCIN HCL IN DEXTROSE 1-5 GM/200ML-% IV SOLN: 1000.0000 mg | INTRAVENOUS | Status: DC

## 2014-10-26 MED ORDER — POTASSIUM CHLORIDE IN NACL 20-0.45 MEQ/L-% IV SOLN
INTRAVENOUS | Status: DC
Start: 2014-10-26 — End: 2014-10-27
  Administered 2014-10-26: via INTRAVENOUS
  Filled 2014-10-26 (×4): qty 1000

## 2014-10-26 MED ORDER — SODIUM CHLORIDE 0.9 % IV SOLN: INTRAVENOUS | Status: DC

## 2014-10-26 MED ORDER — ASPIRIN 325 MG PO TABS
325.0000 mg | ORAL_TABLET | Freq: Every day | ORAL | Status: DC
  Administered 2014-10-26 – 2014-10-30 (×4): 325 mg via ORAL
  Filled 2014-10-26 (×4): qty 1

## 2014-10-26 MED ORDER — POTASSIUM CHLORIDE 10 MEQ/100ML IV SOLN
10.0000 meq | INTRAVENOUS | Status: AC
Start: 2014-10-26 — End: 2014-10-27
  Administered 2014-10-26 – 2014-10-27 (×4): 10 meq via INTRAVENOUS
  Filled 2014-10-26 (×2): qty 100

## 2014-10-26 MED ORDER — SODIUM BICARBONATE 8.4 % IV SOLN
50.0000 meq | Freq: Once | INTRAVENOUS | Status: AC
  Administered 2014-10-26: 50 meq via INTRAVENOUS
  Filled 2014-10-26: qty 50

## 2014-10-26 MED ORDER — INSULIN REGULAR BOLUS VIA INFUSION
0.0000 [IU] | Freq: Three times a day (TID) | INTRAVENOUS | Status: DC
  Filled 2014-10-26: qty 10

## 2014-10-26 MED ORDER — SODIUM CHLORIDE 0.9 % IV SOLN
INTRAVENOUS | Status: DC
  Administered 2014-10-26: 12.7 [IU]/h via INTRAVENOUS
  Filled 2014-10-26: qty 2.5

## 2014-10-26 MED ORDER — PIPERACILLIN-TAZOBACTAM IN DEX 2-0.25 GM/50ML IV SOLN
2.2500 g | Freq: Four times a day (QID) | INTRAVENOUS | Status: DC
  Administered 2014-10-26 – 2014-10-27 (×3): 2.25 g via INTRAVENOUS
  Filled 2014-10-26 (×4): qty 50

## 2014-10-26 MED ORDER — PIPERACILLIN-TAZOBACTAM 3.375 G IVPB
3.3750 g | Freq: Once | INTRAVENOUS | Status: AC
  Administered 2014-10-26: 3.375 g via INTRAVENOUS
  Filled 2014-10-26: qty 50

## 2014-10-26 MED ORDER — LEVOTHYROXINE SODIUM 100 MCG IV SOLR
62.5000 ug | Freq: Every day | INTRAVENOUS | Status: DC
  Administered 2014-10-26 – 2014-10-31 (×6): 62.5 ug via INTRAVENOUS
  Filled 2014-10-26 (×6): qty 5

## 2014-10-26 MED ORDER — SODIUM CHLORIDE 0.9 % IV BOLUS (SEPSIS)
1000.0000 mL | Freq: Once | INTRAVENOUS | Status: AC
Start: 2014-10-26 — End: 2014-10-26
  Administered 2014-10-26: 1000 mL via INTRAVENOUS

## 2014-10-26 NOTE — Progress Notes (Signed)
ANTIBIOTIC CONSULT NOTE - INITIAL  Pharmacy Consult for Vancomycin/Zosyn/Diflucan Indication: SIRS; R/O infectious cause of recurrent DKA (recent UTI); inguinal yeast infection  Allergies  Allergen Reactions  . Sulfa Antibiotics Anaphylaxis and Swelling    Stiff neck also     Patient Measurements: Height: 5\' 4"  (162.6 cm) Weight: 155 lb 6.8 oz (70.5 kg) IBW/kg (Calculated) : 54.7 Adjusted Body Weight: n/a  Vital Signs: Temp: 97.5 F (36.4 C) (02/17 1613) Temp Source: Oral (02/17 1613) BP: 109/47 mmHg (02/17 1545) Pulse Rate: 110 (02/17 1545) Intake/Output from previous day:   Intake/Output from this shift: Total I/O In: 1000 [I.V.:1000] Out: -   Labs:  Recent Labs  10/24/14 0625 10/25/14 0552 10/26/14 1207 10/26/14 1501  WBC 5.3  --  8.7  --   HGB 9.5*  --  9.8*  --   PLT 206  --  358  --   CREATININE 2.72* 2.68* 3.47* 2.88*   Estimated Creatinine Clearance: 19 mL/min (by C-G formula based on Cr of 2.88). No results for input(s): VANCOTROUGH, VANCOPEAK, VANCORANDOM, GENTTROUGH, GENTPEAK, GENTRANDOM, TOBRATROUGH, TOBRAPEAK, TOBRARND, AMIKACINPEAK, AMIKACINTROU, AMIKACIN in the last 72 hours.   Microbiology: Recent Results (from the past 720 hour(s))  MRSA PCR Screening     Status: None   Collection Time: 10/20/14 11:26 PM  Result Value Ref Range Status   MRSA by PCR NEGATIVE NEGATIVE Final    Comment:        The GeneXpert MRSA Assay (FDA approved for NASAL specimens only), is one component of a comprehensive MRSA colonization surveillance program. It is not intended to diagnose MRSA infection nor to guide or monitor treatment for MRSA infections.   Culture, blood (routine x 2)     Status: None (Preliminary result)   Collection Time: 10/21/14 12:50 AM  Result Value Ref Range Status   Specimen Description BLOOD LEFT ANTECUBITAL  Final   Special Requests BOTTLES DRAWN AEROBIC AND ANAEROBIC 3CC  Final   Culture   Final           BLOOD CULTURE RECEIVED  NO GROWTH TO DATE CULTURE WILL BE HELD FOR 5 DAYS BEFORE ISSUING A FINAL NEGATIVE REPORT Performed at [facility]    Report Status PENDING  Incomplete  Culture, blood (routine x 2)     Status: None (Preliminary result)   Collection Time: 10/21/14 12:55 AM  Result Value Ref Range Status   Specimen Description BLOOD LEFT HAND  Final   Special Requests BOTTLES DRAWN AEROBIC AND ANAEROBIC 5CC  Final   Culture   Final           BLOOD CULTURE RECEIVED NO GROWTH TO DATE CULTURE WILL BE HELD FOR 5 DAYS BEFORE ISSUING A FINAL NEGATIVE REPORT Performed at [facility]    Report Status PENDING  Incomplete  Urine culture     Status: None   Collection Time: 10/22/14 12:20 PM  Result Value Ref Range Status   Specimen Description URINE, CATHETERIZED  Final   Special Requests NONE  Final   Colony Count NO GROWTH Performed at [facility]   Final   Culture NO GROWTH Performed at [facility]   Final   Report Status 10/23/2014 FINAL  Final    Medical History: Past Medical History  Diagnosis Date  . Diabetes mellitus     on insulin, with h/o DKA   . HTN (hypertension)   . Hyperlipidemia   . Venous stasis ulcers   . Arthritis   . Depression  anxiety  . 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 Hypertension   . Coronary artery disease     angina.  MI.   . Pneumonia        . GERD (gastroesophageal reflux disease)     Hiatal hernia.   . Hypothyroidism   . CHF (congestive heart failure)   . Tuberculosis   . Osteoporosis     Medications:  Scheduled:  . fluconazole (DIFLUCAN) IV  100 mg Intravenous Once  . heparin  5,000 Units Subcutaneous 3 times per day  . insulin regular  0-10 Units Intravenous TID WC  . levothyroxine  62.5 mcg Intravenous Daily  . pantoprazole (PROTONIX) IV  40 mg Intravenous Q24H  . sodium chloride  1,000 mL Intravenous Once   Infusions:  . sodium chloride    . sodium chloride  999 mL/hr at 10/26/14 1535  . sodium chloride    . dextrose 5 % and 0.45% NaCl    . dextrose 5 % and 0.45% NaCl    . insulin (NOVOLIN-R) infusion 16.2 Units/hr (10/26/14 1519)   Anti-infectives    Start     Dose/Rate Route Frequency Ordered Stop   10/26/14 1700  fluconazole (DIFLUCAN) IVPB 100 mg     100 mg 50 mL/hr over 60 Minutes Intravenous  Once 10/26/14 1553     10/26/14 1300  vancomycin (VANCOCIN) 1,250 mg in sodium chloride 0.9 % 250 mL IVPB     1,250 mg 166.7 mL/hr over 90 Minutes Intravenous  Once 10/26/14 1237 10/26/14 1435   10/26/14 1245  piperacillin-tazobactam (ZOSYN) IVPB 3.375 g     3.375 g 12.5 mL/hr over 240 Minutes Intravenous  Once 10/26/14 1237 10/26/14 1326     Assessment Bridget Martinez with PMH type 1 DM, CABG, CHF presents with DKA.  Recently admitted at [facility] from 2/11-2/16 for same, secondary to UTI/AKI.  From [facility] visit, urine cultures negative, blood cultures still pending.  Received Vanc 1250 mg and Zosyn x 1 in ED at 1300 today; pharmacy consulted to continue vancomycin/Zosyn  Also with noted extensive inguinal intertrigo with possible vaginal involvement; CCM would also like to begin Diflucan per pharmacy  2/17 >> vancomycin >> 2/17 >> Zosyn >> 2/17 >> Diflucan >>  Temp: AF this admission WBC: wnl Renal: Baseline SCr 1.0 but has been in continued AKI since prior admission, with SCr running 2.5-3.  Current CrCl 19 (CG)  2/12 blood: NGTD 2/13 urine: NGF 2/17 blood: IP   Goal of Therapy:   Vancomycin trough level 15-20 mcg/ml   Eradication of infection  Appropriate antibiotic dosing for indication and renal function  Plan:   Begin vancomycin 1g IV q48h.  CrCl appears to be stable for now  Begin Zosyn 2.25 g IV q6 with current renal function. Can change to extended infusion if renal function recovers; would also consider changing to EI if CrCl continues to be stable in the 19-20 range  Received Diflucan 100 mg IV x 1 today, begin 50 mg IV q24  starting tomorrow.  Would plan for 2-6 weeks of therapy, or until resolution of symptoms  Measure vancomycin trough levels at steady state as indicated  Follow for recovery of renal function  Follow clinical course, culture results as available  Follow for de-escalation of antibiotics and LOT   Reuel Boom, PharmD Pager: [phone] 10/26/2014, 4:32 PM

## 2014-10-26 NOTE — H&P (Signed)
PULMONARY / CRITICAL CARE MEDICINE   Name: Bridget Martinez MRN: [id number] DOB: 12-03-1950    ADMISSION DATE:  10/26/2014 CONSULTATION DATE:  10/26/14  REFERRING MD :  Dr. Doy Mince, EDP  CHIEF COMPLAINT:  DKA  INITIAL PRESENTATION: [name] with PMH of DM and recent d/c for DKA on 2/16 who was readmitted 2/17 with DKA.    STUDIES:  2/17  CXR >>   SIGNIFICANT EVENTS: 2/16  Discharge from [facility] for DKA  2/17  Re-Admit to [facility] for DKA   HISTORY OF PRESENT ILLNESS:  [name], never smoker, with PMH of HTN, HLD, CAD s/p CABG, CHF, GERD, TB, osteoporosis / arthritis, depression and DM-1 (since age 49) who presented to [facility] on 2/17 via EMS after being seen by the home health RN with CBG readings of 'high'.    She was admitted to [facility] from 2/11 - 10/25/14 for DKA secondary to UTI and AKI. Renal US was negative for hydronephrosis.  She was evaluated by Nephrology for AKI & insulin regimen was adjusted per the Diabetes Coordinator.  She was noted to have very brittle diabetes and difficult to control with both hypo / hyperglycemia in the same day.  She was planned to follow up with Dr. Buddy Duty on 2/18 but was admitted to [facility] on 2/17.  Discharge sr cr plateaued around 2.6.  Husband reports he felt she was not ready for discharge reporting she was slow to dress.  After they arrived home, he noted her to have a slurring of speech.  She reports only taking 4 units of insulin after discharge at home because she was afraid of hypoglycemia.    ER work up notable for Na 131, K 6.2, Cl 94, Cr 3.47, glucose 1096, AG 31, WBC 8.7, Hgb 9.8, MCV 102.2, UA neg for UTI.  PCCM called for ICU admission.     PAST MEDICAL HISTORY :   has a past medical history of Diabetes mellitus; HTN (hypertension); Hyperlipidemia; Venous stasis ulcers; Arthritis; Depression; Myocardial infarction; Hypertension; Coronary artery disease; Pneumonia; GERD (gastroesophageal reflux disease); Hypothyroidism; CHF  (congestive heart failure); Tuberculosis; and Osteoporosis.  has past surgical history that includes Cardiac catheterization; Tonsillectomy; Coronary artery bypass graft; Coronary artery bypass graft (2012); Esophagogastroduodenoscopy (N/A, 11/17/2013); Colonoscopy (N/A, 11/18/2013); right heart catheterization (N/A, 07/29/2011); and left heart catheterization with coronary angiogram (N/A, 07/26/2011).    HOME MEDICATIONS: Prior to Admission medications   Medication Sig Start Date End Date Taking? Authorizing Provider  carvedilol (COREG) 6.25 MG tablet Take 1 tablet (6.25 mg total) by mouth 2 (two) times daily with a meal. 10/18/14  Yes Jolaine Artist, MD  clopidogrel (PLAVIX) 75 MG tablet Take 1 tablet (75 mg total) by mouth daily with breakfast. 01/12/14  Yes Lysbeth Penner, FNP  escitalopram (LEXAPRO) 10 MG tablet Take 1 tablet (10 mg total) by mouth daily. 09/27/13  Yes Lysbeth Penner, FNP  insulin aspart (NOVOLOG) 100 UNIT/ML injection Inject 3 Units into the skin 3 (three) times daily with meals. Patient taking differently: Inject 9-20 Units into the skin 3 (three) times daily with meals. On sliding scale 09/21/14  Yes Nishant Dhungel, MD  insulin detemir (LEVEMIR) 100 UNIT/ML injection Inject 0.07 mLs (7 Units total) into the skin daily. 10/25/14  Yes Shanker Kristeen Mans, MD  levothyroxine (SYNTHROID, LEVOTHROID) 125 MCG tablet Take 1 tablet (125 mcg total) by mouth daily. 09/27/13  Yes Lysbeth Penner, FNP  metoCLOPramide (REGLAN) 10 MG tablet Take 1  tablet (10 mg total) by mouth 4 (four) times daily -  before meals and at bedtime. 10/09/14  Yes Debbe Odea, MD  ondansetron (ZOFRAN ODT) 8 MG disintegrating tablet Take 1 tablet (8 mg total) by mouth every 8 (eight) hours as needed for nausea or vomiting. 10/09/14  Yes Debbe Odea, MD  pantoprazole (PROTONIX) 40 MG tablet Take 1 tablet (40 mg total) by mouth daily. 07/31/14 03/30/17 Yes Ripudeep Krystal Eaton, MD  rosuvastatin (CRESTOR) 40 MG tablet Take  1 tablet (40 mg total) by mouth daily. 09/27/13  Yes Lysbeth Penner, FNP  Vitamin D, Ergocalciferol, (DRISDOL) 50000 UNITS CAPS capsule Take 1 capsule by mouth every Saturday.  09/29/13  Yes Historical Provider, MD  aspirin EC 81 MG tablet Take 1 tablet (81 mg total) by mouth daily. Patient not taking: Reported on 10/21/2014 06/15/14   Rande Brunt, NP   Allergies  Allergen Reactions  . Sulfa Antibiotics Anaphylaxis and Swelling    Stiff neck also     FAMILY HISTORY:  indicated that her mother is deceased. She indicated that her father is deceased. She indicated that her maternal grandmother is deceased. She indicated that her maternal grandfather is deceased. She indicated that her paternal grandmother is deceased. She indicated that her paternal grandfather is deceased.    SOCIAL HISTORY:  reports that she has never smoked. She does not have any smokeless tobacco history on file. She reports that she does not drink alcohol or use illicit drugs.  REVIEW OF SYSTEMS:  Unable to complete with patient as she is altered.  Information obtained from husband at bedside and prior medical documentation.   SUBJECTIVE:   VITAL SIGNS: Temp:  [97.9 F (36.6 C)] 97.9 F (36.6 C) (02/17 1106) Pulse Rate:  [102-119] 107 (02/17 1515) Resp:  [12-28] 16 (02/17 1515) BP: (75-109)/(24-70) 100/38 mmHg (02/17 1515) SpO2:  [96 %-100 %] 100 % (02/17 1515)   HEMODYNAMICS:     VENTILATOR SETTINGS:     INTAKE / OUTPUT:  Intake/Output Summary (Last 24 hours) at 10/26/14 1530 Last data filed at 10/26/14 1317  Gross per 24 hour  Intake   1000 ml  Output      0 ml  Net   1000 ml    PHYSICAL EXAMINATION: General:  Chronically ill appearing female in NAD Neuro:  Lethargic, arouses to voice, appropriate but drifts back to sleep HEENT:  MM pink/dry, no jvd, old central line site on L neck c/d/i, R eye with white/opaque area over pupil, unable to see red light reflex Cardiovascular:  s1s2 rrr, no m/r/g   Lungs:  Even/non-labored, lungs bilaterally clear  Abdomen:  Obese/soft, bsx4 active  Musculoskeletal:  No acute deformities  Skin:  Warm/dry, inguinal folds and peri area erythematous with yeast-like discharge  LABS:  CBC  Recent Labs Lab 10/23/14 0413 10/24/14 0625 10/26/14 1207  WBC 6.2 5.3 8.7  HGB 8.7* 9.5* 9.8*  HCT 25.5* 27.9* 33.1*  PLT 184 206 358   Coag's No results for input(s): APTT, INR in the last 168 hours. BMET  Recent Labs Lab 10/24/14 0625 10/25/14 0552 10/26/14 1207  NA 138 137 131*  K 3.8 3.5 6.2*  CL 105 103 94*  CO2 23 21 6*  BUN 17 18 40*  CREATININE 2.72* 2.68* 3.47*  GLUCOSE 125* 312* 1096*   Electrolytes  Recent Labs Lab 10/21/14 0050  10/23/14 0413 10/24/14 0625 10/25/14 0552 10/26/14 1207  CALCIUM 8.9  < > 7.9* 8.1* 8.2* 8.4  MG  2.0  --   --   --   --   --   PHOS 6.2*  --  2.7 3.2 3.1  --   < > = values in this interval not displayed.   Sepsis Markers No results for input(s): LATICACIDVEN, PROCALCITON, O2SATVEN in the last 168 hours.   ABG  Recent Labs Lab 10/20/14 2001  PHART 6.952*  PCO2ART 12.9*  PO2ART 155.0*   Liver Enzymes  Recent Labs Lab 10/20/14 1951 10/23/14 0413 10/24/14 0625 10/25/14 0552  AST 30  --   --   --   ALT 17  --   --   --   ALKPHOS 139*  --   --   --   BILITOT 2.1*  --   --   --   ALBUMIN 2.8* 2.2* 2.2* 2.4*   Cardiac Enzymes No results for input(s): TROPONINI, PROBNP in the last 168 hours.   Glucose  Recent Labs Lab 10/25/14 0733 10/25/14 1204 10/26/14 1106 10/26/14 1310 10/26/14 1402 10/26/14 1511  GLUCAP 366* 187* >600* >600* >600* >600*    Imaging No results found.   ASSESSMENT / PLAN:  ENDOCRINE A:   Presumed DKA - ketones pending, suspect lack of insulin use post discharge as contributing factor.  DM-I  Hypothyroidism  P:   DKA protocol  Diabetes Coordinator consult  Continue synthroid IV, 62.5 mcg  Volume resuscitation   PULMONARY OETT n/a  A: No  acute process, r/o pulmonary infiltrate P:   Aspiration precautions  Oxygen as needed to support saturations > 90%  CARDIOVASCULAR CVL n/a  A:  Hypotension - in the setting of volume depletion with DKA, resolving.  Hx HTN, HLD, CAD s/p CABG, CHF P:  Hold home crestor, plavix, coreg, ASA until mental status improves Responding to IVF, no pressors / line needed at this time Assess troponin with hypotension Volume resuscitation, monitor for volume overload with hx of CHF ICU monitoring   RENAL A:  Acute on Chronic Kidney Injury - in setting of volume depletion, hypotension and DKA Anion Gap Metabolic Acidosis - in setting of DKA, no additional non-gap component  Hyperkalemia P:   Volume as above Trend BMP Replace electrolytes as indicated Trend AG   GASTROINTESTINAL A:   GERD Gastroparesis  P:   PPI IV Hold home reglan  NPO   HEMATOLOGIC A:   Macrocytic Anemia  P:  Consider addition of B12 orally once improved Monitor CBC  Heparin for DVT prophylaxis  Check FOB   INFECTIOUS A:   Inguinal Yeast Infection  SIRS - r/o infectious etiology  P:   BCx2 2/17 >>  UA 2/17 >> ++glucose, otherwise essentially neg  Vanco, start date 2/17, day 1/x Zosyn, start date 2/17, day1/x  Diflucan x1 2/17  Consider narrow vs d/c abx on 2/18 given recent admit & Rx for UTI  NEUROLOGIC A:   Acute Metabolic Encephalopathy - in setting of AKI / DKA Depression  P:   RASS goal: n/a Hold baseline Lexapro   FAMILY  - Updates: Husband updated at bedside.     GLOBAL:  Husband indicates concern for her returning home.  Feels she needs SNF / Rehab efforts after d/c   Noe Gens, NP-C  Pulmonary & Critical Care Pgr: [phone] or [phone]    10/26/2014, 3:30 PM

## 2014-10-26 NOTE — ED Provider Notes (Signed)
CSN: [id number]     Arrival date & time 10/26/14  1059 History   First MD Initiated Contact with Patient 10/26/14 1110     Chief Complaint  Patient presents with  . Hyperglycemia     (Consider location/radiation/quality/duration/timing/severity/associated sxs/prior Treatment) HPI Comments: 64 yo female presenting with lethargy and confusion.  Unable to obtain complete history for this reason.  Level V Caveat applies.    Patient is a 64 y.o. female presenting with altered mental status.  Altered Mental Status Presenting symptoms: confusion   Severity:  Severe Most recent episode:  Today Episode history:  Single Timing:  Constant Progression:  Worsening Chronicity:  Recurrent Context comment:  Discharged from hospital yesterday after being treated for DKA and brittle diabetes. Associated symptoms: nausea and vomiting     Past Medical History  Diagnosis Date  . Diabetes mellitus     on insulin, with h/o DKA   . HTN (hypertension)   . Hyperlipidemia   . Venous stasis ulcers   . Arthritis   . Depression     anxiety  . 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 Hypertension   . Coronary artery disease     angina.  MI.   . Pneumonia        . GERD (gastroesophageal reflux disease)     Hiatal hernia.   . Hypothyroidism   . CHF (congestive heart failure)   . Tuberculosis   . Osteoporosis    Past Surgical History  Procedure Laterality Date  . Cardiac catheterization    . Tonsillectomy    . Coronary artery bypass graft      Dr. Prescott Gum in 09/2011   . Coronary artery bypass graft  2012  . Esophagogastroduodenoscopy N/A 11/17/2013    Procedure: ESOPHAGOGASTRODUODENOSCOPY (EGD);  Surgeon: Lafayette Dragon, MD;  Location: [facility] ENDOSCOPY;  Service: Endoscopy;  Laterality: N/A;  . Colonoscopy N/A 11/18/2013    Procedure: COLONOSCOPY;  Surgeon: Lafayette Dragon, MD;  Location: [facility] ENDOSCOPY;  Service: Endoscopy;  Laterality: N/A;  . Right heart  catheterization N/A 07/29/2011    Procedure: RIGHT HEART CATH;  Surgeon: Minus Breeding, MD;  Location: [facility] CATH LAB;  Service: Cardiovascular;  Laterality: N/A;  . Left heart catheterization with coronary angiogram N/A 07/26/2011    Procedure: LEFT HEART CATHETERIZATION WITH CORONARY ANGIOGRAM;  Surgeon: Larey Dresser, MD;  Location: [facility] CATH LAB;  Service: Cardiovascular;  Laterality: N/A;   Family History  Problem Relation Age of Onset  . Heart disease Father   . Multiple sclerosis Father   . Hypertension Mother   . Hyperthyroidism Mother   . Diabetes Cousin     Multiple maternal cousins with type 2 diabetes mellitus  . Diabetes Maternal Uncle     Type 1 diabetes mellitus  . Heart attack Paternal Grandfather   . Heart disease Paternal Grandfather    History  Substance Use Topics  . Smoking status: Never Smoker   . Smokeless tobacco: Not on file  . Alcohol Use: No   OB History    No data available     Review of Systems  Unable to perform ROS: Mental status change  Gastrointestinal: Positive for nausea and vomiting.  Psychiatric/Behavioral: Positive for confusion.      Allergies  Sulfa antibiotics  Home Medications   Prior to Admission medications   Medication Sig Start Date End Date Taking? Authorizing Provider  carvedilol (COREG) 6.25 MG tablet Take 1 tablet (  6.25 mg total) by mouth 2 (two) times daily with a meal. 10/18/14  Yes Jolaine Artist, MD  clopidogrel (PLAVIX) 75 MG tablet Take 1 tablet (75 mg total) by mouth daily with breakfast. 01/12/14  Yes Lysbeth Penner, FNP  escitalopram (LEXAPRO) 10 MG tablet Take 1 tablet (10 mg total) by mouth daily. 09/27/13  Yes Lysbeth Penner, FNP  insulin aspart (NOVOLOG) 100 UNIT/ML injection Inject 3 Units into the skin 3 (three) times daily with meals. Patient taking differently: Inject 9-20 Units into the skin 3 (three) times daily with meals. On sliding scale 09/21/14  Yes Nishant Dhungel, MD  insulin detemir (LEVEMIR)  100 UNIT/ML injection Inject 0.07 mLs (7 Units total) into the skin daily. 10/25/14  Yes Shanker Kristeen Mans, MD  levothyroxine (SYNTHROID, LEVOTHROID) 125 MCG tablet Take 1 tablet (125 mcg total) by mouth daily. 09/27/13  Yes Lysbeth Penner, FNP  metoCLOPramide (REGLAN) 10 MG tablet Take 1 tablet (10 mg total) by mouth 4 (four) times daily -  before meals and at bedtime. 10/09/14  Yes Debbe Odea, MD  ondansetron (ZOFRAN ODT) 8 MG disintegrating tablet Take 1 tablet (8 mg total) by mouth every 8 (eight) hours as needed for nausea or vomiting. 10/09/14  Yes Debbe Odea, MD  pantoprazole (PROTONIX) 40 MG tablet Take 1 tablet (40 mg total) by mouth daily. 07/31/14 03/30/17 Yes Ripudeep Krystal Eaton, MD  rosuvastatin (CRESTOR) 40 MG tablet Take 1 tablet (40 mg total) by mouth daily. 09/27/13  Yes Lysbeth Penner, FNP  Vitamin D, Ergocalciferol, (DRISDOL) 50000 UNITS CAPS capsule Take 1 capsule by mouth every Saturday.  09/29/13  Yes Historical Provider, MD  aspirin EC 81 MG tablet Take 1 tablet (81 mg total) by mouth daily. Patient not taking: Reported on 10/21/2014 06/15/14   Rande Brunt, NP   BP 77/35 mmHg  Pulse 113  Temp(Src) 97.9 F (36.6 C) (Oral)  Resp 20  SpO2 97% Physical Exam  Constitutional: She is oriented to person, place, and time. She appears well-developed and well-nourished. No distress.  HENT:  Head: Normocephalic and atraumatic.  Mouth/Throat: Oropharynx is clear and moist.  Eyes: Conjunctivae are normal. Pupils are equal, round, and reactive to light. No scleral icterus.  Cloudy right pupil  Neck: Neck supple.  Cardiovascular: Normal rate, regular rhythm, normal heart sounds and intact distal pulses.   No murmur heard. Pulmonary/Chest: Breath sounds normal. No stridor. She is in respiratory distress. She has no rales.  Kussmaul breathing pattern  Abdominal: Soft. Bowel sounds are normal. She exhibits no distension. There is no tenderness. There is no rigidity, no rebound and no  guarding.  Musculoskeletal: Normal range of motion.  Neurological: She is alert and oriented to person, place, and time. GCS eye subscore is 3. GCS verbal subscore is 4. GCS motor subscore is 6.  Skin: Skin is warm and dry. No rash noted.  Psychiatric:  Unable to test  Nursing note and vitals reviewed.   ED Course  CRITICAL CARE Performed by: Cyd Silence DAVID Authorized by: Cyd Silence DAVID Total critical care time: 40 minutes Critical care time was exclusive of separately billable procedures and treating other patients. Critical care was necessary to treat or prevent imminent or life-threatening deterioration of the following conditions: metabolic crisis. Critical care was time spent personally by me on the following activities: development of treatment plan with patient or surrogate, discussions with consultants, evaluation of patient's response to treatment, examination of patient, obtaining history from  patient or surrogate, ordering and performing treatments and interventions, ordering and review of laboratory studies, ordering and review of radiographic studies, pulse oximetry, re-evaluation of patient's condition and review of old charts.   (including critical care time) Labs Review Labs Reviewed  CBC WITH DIFFERENTIAL/PLATELET - Abnormal; Notable for the following:    RBC 3.24 (*)    Hemoglobin 9.8 (*)    HCT 33.1 (*)    MCV 102.2 (*)    MCHC 29.6 (*)    Neutrophils Relative % 89 (*)    Lymphocytes Relative 8 (*)    All other components within normal limits  BASIC METABOLIC PANEL - Abnormal; Notable for the following:    Sodium 131 (*)    Potassium 6.2 (*)    Chloride 94 (*)    CO2 6 (*)    Glucose, Bld 1096 (*)    BUN 40 (*)    Creatinine, Ser 3.47 (*)    GFR calc non Af Amer 13 (*)    GFR calc Af Amer 15 (*)    Anion gap 31 (*)    All other components within normal limits  URINALYSIS, ROUTINE W REFLEX MICROSCOPIC - Abnormal; Notable for the following:     Color, Urine STRAW (*)    APPearance CLOUDY (*)    Glucose, UA >1000 (*)    Hgb urine dipstick TRACE (*)    Ketones, ur >80 (*)    All other components within normal limits  BLOOD GAS, VENOUS - Abnormal; Notable for the following:    pH, Ven 6.973 (*)    pCO2, Ven 22.0 (*)    pO2, Ven 120.0 (*)    Bicarbonate 4.8 (*)    Acid-base deficit 25.7 (*)    All other components within normal limits  CBG MONITORING, ED - Abnormal; Notable for the following:    Glucose-Capillary >600 (*)    All other components within normal limits  CBG MONITORING, ED - Abnormal; Notable for the following:    Glucose-Capillary >600 (*)    All other components within normal limits  CBG MONITORING, ED - Abnormal; Notable for the following:    Glucose-Capillary >600 (*)    All other components within normal limits  CBG MONITORING, ED - Abnormal; Notable for the following:    Glucose-Capillary >600 (*)    All other components within normal limits  CULTURE, BLOOD (ROUTINE X 2)  CULTURE, BLOOD (ROUTINE X 2)  URINE MICROSCOPIC-ADD ON  BASIC METABOLIC PANEL  BASIC METABOLIC PANEL  BASIC METABOLIC PANEL  BASIC METABOLIC PANEL  KETONES, QUALITATIVE  HEMOGLOBIN A1C  TROPONIN I  TROPONIN I  TROPONIN I  LACTIC ACID, PLASMA    Imaging Review Portable Chest X-ray (1 View)  10/26/2014   CLINICAL DATA:  Diabetic ketoacidosis.  Hypertension  EXAM: PORTABLE CHEST - 1 VIEW  COMPARISON:  October 21, 2014.  FINDINGS: Lungs are clear. Heart is mildly enlarged with pulmonary vascularity within normal limits. Patient is status post coronary artery bypass grafting. No adenopathy. No bone lesions.  IMPRESSION: Heart mildly enlarged.  No lung edema or consolidation.   Electronically Signed   By: Lowella Grip III M.D.   On: 10/26/2014 15:21     EKG Interpretation None     EKG - sinus tachycardia, rate 113, LAD, LBBB.  LBBB seen on previous EKG.     MDM   Final diagnoses:  DKA (diabetic ketoacidoses)     64 yo female presenting with altered mental status and hyperglycemia.  She  was also hypotensive and tachycardic.  Finger stick blood sugars were undetectably high.  Treated with IV fluids and insulin drip.  She initially appeared to have a new wide complex rhythm on EKG, but additional old ekg were found that were not in MUSE which were consistent with today's.  Critical care was consulted and have admitted.    Houston Siren III, MD 10/26/14 [phone]

## 2014-10-26 NOTE — ED Notes (Signed)
Per EMS, pt from home.  Pt on ssi at home. Pt d[facility] yesterday.  Pt at home nurse states b/c her cbg was so high, they did not give any insulin.  Pt cbg in route registered high.  No IV at this time.  Pt alert and oriented to baseline.  Vitals:105/58, hr 114, resp 22, 100% ra.

## 2014-10-26 NOTE — ED Notes (Signed)
blood at bedside table

## 2014-10-26 NOTE — Procedures (Addendum)
Arterial Catheter Insertion Procedure Note Bridget Martinez [id number] [birth year]  Procedure: Insertion of Arterial Catheter  Indications: Blood pressure monitoring and Frequent blood sampling  Procedure Details Consent: Unable to obtain consent because of altered level of consciousness. Time Out: Verified patient identification, verified procedure, site/side was marked, verified correct patient position, special equipment/implants available, medications/allergies/relevent history reviewed, required imaging and test results available.  Performed  Maximum sterile technique was used including antiseptics, cap, gloves, gown, hand hygiene, mask and sheet. Skin prep: Chlorhexidine 20 gauge catheter was inserted into right radial artery using the Seldinger technique.  Evaluation Blood flow good; BP tracing good. Complications: No apparent complications .   Chau Savell P 10/26/2014

## 2014-10-26 NOTE — Progress Notes (Signed)
Spoke with Cyril Mourning of [facility] about pt admission to [facility] To follow for d/c needs

## 2014-10-26 NOTE — ED Notes (Signed)
md at bedside

## 2014-10-26 NOTE — ED Notes (Signed)
Notified by EMT that bp dropping.  Pt aroused to verbal but remains lethargic.  Fluids started.  MD notified.

## 2014-10-26 NOTE — Progress Notes (Signed)
[facility] Progress Note Patient Name: CORRIN HAYHURST DOB: February 25, 1951 MRN: [id number]   Date of Service  10/26/2014  HPI/Events of Note  Labs reviewed: Gap now closed, still hyperglycemic Hypkalemic Troponin now 6 Denies chest pain EKG with LBBB which was seen in recent 12 lead prior to this admission, but appears new in 2016  eICU Interventions  Change IVF to 1/2 NS with KCL Give additional KCL Chew up an ASA Serial troponin May need cards consult in AM Considering severe metabolic derrangement, favor this is demand ischemia so don't think she will benefit from anticoagulation, will monitor closely for chest pain     Intervention Category Major Interventions: Acid-Base disturbance - evaluation and management Intermediate Interventions: Electrolyte abnormality - evaluation and management  MCQUAID, DOUGLAS 10/26/2014, 11:20 PM

## 2014-10-26 NOTE — Progress Notes (Signed)
UR completed 

## 2014-10-26 NOTE — ED Notes (Signed)
Bed: [id number] Expected date:  Expected time:  Means of arrival:  Comments: EMS-high sugar

## 2014-10-27 ENCOUNTER — Other Ambulatory Visit: Payer: Self-pay

## 2014-10-27 DIAGNOSIS — E0810 Diabetes mellitus due to underlying condition with ketoacidosis without coma: Secondary | ICD-10-CM

## 2014-10-27 DIAGNOSIS — E43 Unspecified severe protein-calorie malnutrition: Secondary | ICD-10-CM | POA: Diagnosis present

## 2014-10-27 LAB — CBC WITH DIFFERENTIAL/PLATELET
BASOS ABS: 0 10*3/uL (ref 0.0–0.1)
Basophils Relative: 0 % (ref 0–1)
EOS PCT: 0 % (ref 0–5)
Eosinophils Absolute: 0 10*3/uL (ref 0.0–0.7)
HCT: 23.3 % — ABNORMAL LOW (ref 36.0–46.0)
HEMOGLOBIN: 7.8 g/dL — AB (ref 12.0–15.0)
LYMPHS ABS: 1.4 10*3/uL (ref 0.7–4.0)
Lymphocytes Relative: 13 % (ref 12–46)
MCH: 29.9 pg (ref 26.0–34.0)
MCHC: 33.5 g/dL (ref 30.0–36.0)
MCV: 89.3 fL (ref 78.0–100.0)
Monocytes Absolute: 1.2 10*3/uL — ABNORMAL HIGH (ref 0.1–1.0)
Monocytes Relative: 12 % (ref 3–12)
Neutro Abs: 7.8 10*3/uL — ABNORMAL HIGH (ref 1.7–7.7)
Neutrophils Relative %: 75 % (ref 43–77)
Platelets: ADEQUATE 10*3/uL (ref 150–400)
RBC: 2.61 MIL/uL — AB (ref 3.87–5.11)
RDW: 14.2 % (ref 11.5–15.5)
WBC: 10.4 10*3/uL (ref 4.0–10.5)

## 2014-10-27 LAB — GLUCOSE, CAPILLARY
GLUCOSE-CAPILLARY: 364 mg/dL — AB (ref 70–99)
GLUCOSE-CAPILLARY: 101 mg/dL — AB (ref 70–99)
GLUCOSE-CAPILLARY: 148 mg/dL — AB (ref 70–99)
GLUCOSE-CAPILLARY: 166 mg/dL — AB (ref 70–99)
Glucose-Capillary: 115 mg/dL — ABNORMAL HIGH (ref 70–99)
Glucose-Capillary: 128 mg/dL — ABNORMAL HIGH (ref 70–99)
Glucose-Capillary: 149 mg/dL — ABNORMAL HIGH (ref 70–99)
Glucose-Capillary: 188 mg/dL — ABNORMAL HIGH (ref 70–99)
Glucose-Capillary: 229 mg/dL — ABNORMAL HIGH (ref 70–99)
Glucose-Capillary: 313 mg/dL — ABNORMAL HIGH (ref 70–99)
Glucose-Capillary: 90 mg/dL (ref 70–99)
Glucose-Capillary: 95 mg/dL (ref 70–99)
Glucose-Capillary: 95 mg/dL (ref 70–99)
Glucose-Capillary: 137 mg/dL — ABNORMAL HIGH (ref 70–99)
Glucose-Capillary: 132 mg/dL — ABNORMAL HIGH (ref 70–99)

## 2014-10-27 LAB — CULTURE, BLOOD (ROUTINE X 2)
Culture: NO GROWTH
Culture: NO GROWTH

## 2014-10-27 LAB — BASIC METABOLIC PANEL
ANION GAP: 6 (ref 5–15)
Anion gap: 25 — ABNORMAL HIGH (ref 5–15)
Anion gap: 10 (ref 5–15)
BUN: 38 mg/dL — AB (ref 6–23)
BUN: 28 mg/dL — ABNORMAL HIGH (ref 6–23)
BUN: 28 mg/dL — ABNORMAL HIGH (ref 6–23)
CALCIUM: 7.3 mg/dL — AB (ref 8.4–10.5)
CALCIUM: 6.9 mg/dL — AB (ref 8.4–10.5)
CO2: 6 mmol/L — CL (ref 19–32)
CO2: 17 mmol/L — ABNORMAL LOW (ref 19–32)
CO2: 14 mmol/L — AB (ref 19–32)
CREATININE: 2.88 mg/dL — AB (ref 0.50–1.10)
CREATININE: 2.44 mg/dL — AB (ref 0.50–1.10)
Calcium: 7 mg/dL — ABNORMAL LOW (ref 8.4–10.5)
Chloride: 108 mmol/L (ref 96–112)
Chloride: 118 mmol/L — ABNORMAL HIGH (ref 96–112)
Chloride: 116 mmol/L — ABNORMAL HIGH (ref 96–112)
Creatinine, Ser: 2.44 mg/dL — ABNORMAL HIGH (ref 0.50–1.10)
GFR calc non Af Amer: 16 mL/min — ABNORMAL LOW (ref >90)
GFR calc non Af Amer: 20 mL/min — ABNORMAL LOW (ref >90)
GFR, EST AFRICAN AMERICAN: 19 mL/min — AB (ref >90)
GFR, EST AFRICAN AMERICAN: 23 mL/min — AB (ref >90)
GFR, EST AFRICAN AMERICAN: 23 mL/min — AB (ref >90)
GFR, EST NON AFRICAN AMERICAN: 20 mL/min — AB (ref >90)
Glucose, Bld: 799 mg/dL (ref 70–99)
Glucose, Bld: 106 mg/dL — ABNORMAL HIGH (ref 70–99)
Glucose, Bld: 178 mg/dL — ABNORMAL HIGH (ref 70–99)
Potassium: 4 mmol/L (ref 3.5–5.1)
Potassium: 3.3 mmol/L — ABNORMAL LOW (ref 3.5–5.1)
Potassium: 3.6 mmol/L (ref 3.5–5.1)
SODIUM: 141 mmol/L (ref 135–145)
SODIUM: 140 mmol/L (ref 135–145)
Sodium: 139 mmol/L (ref 135–145)

## 2014-10-27 LAB — TROPONIN I
TROPONIN I: 9.53 ng/mL — AB (ref <0.031)
Troponin I: 12.26 ng/mL (ref <0.031)

## 2014-10-27 LAB — MAGNESIUM: Magnesium: 1.6 mg/dL (ref 1.5–2.5)

## 2014-10-27 LAB — PROCALCITONIN: PROCALCITONIN: 2.95 ng/mL

## 2014-10-27 LAB — HEPARIN LEVEL (UNFRACTIONATED): Heparin Unfractionated: 0.23 IU/mL — ABNORMAL LOW (ref 0.30–0.70)

## 2014-10-27 LAB — PHOSPHORUS: PHOSPHORUS: 3.1 mg/dL (ref 2.3–4.6)

## 2014-10-27 LAB — LACTIC ACID, PLASMA: Lactic Acid, Venous: 0.9 mmol/L (ref 0.5–2.0)

## 2014-10-27 MED ORDER — VANCOMYCIN HCL IN DEXTROSE 750-5 MG/150ML-% IV SOLN: 750.0000 mg | INTRAVENOUS | Status: DC

## 2014-10-27 MED ORDER — ONDANSETRON HCL 4 MG/2ML IJ SOLN
4.0000 mg | Freq: Four times a day (QID) | INTRAMUSCULAR | Status: DC | PRN
  Administered 2014-10-27 – 2014-11-01 (×3): 4 mg via INTRAVENOUS
  Filled 2014-10-27 (×3): qty 2

## 2014-10-27 MED ORDER — ROSUVASTATIN CALCIUM 40 MG PO TABS
40.0000 mg | ORAL_TABLET | Freq: Every day | ORAL | Status: DC
  Administered 2014-10-27 – 2014-11-05 (×9): 40 mg via ORAL
  Filled 2014-10-27: qty 1
  Filled 2014-10-27: qty 4
  Filled 2014-10-27 (×3): qty 1
  Filled 2014-10-27: qty 4
  Filled 2014-10-27 (×6): qty 1
  Filled 2014-10-27: qty 4

## 2014-10-27 MED ORDER — INSULIN DETEMIR 100 UNIT/ML ~~LOC~~ SOLN
5.0000 [IU] | SUBCUTANEOUS | Status: DC
  Administered 2014-10-27 – 2014-10-28 (×2): 5 [IU] via SUBCUTANEOUS
  Filled 2014-10-27 (×3): qty 0.05

## 2014-10-27 MED ORDER — HEPARIN (PORCINE) IN NACL 100-0.45 UNIT/ML-% IJ SOLN
750.0000 [IU]/h | INTRAMUSCULAR | Status: DC
  Administered 2014-10-27: 750 [IU]/h via INTRAVENOUS
  Filled 2014-10-27: qty 250

## 2014-10-27 MED ORDER — PANTOPRAZOLE SODIUM 40 MG PO TBEC
40.0000 mg | DELAYED_RELEASE_TABLET | Freq: Every day | ORAL | Status: DC
  Administered 2014-10-27: 40 mg via ORAL
  Filled 2014-10-27: qty 1

## 2014-10-27 MED ORDER — METOCLOPRAMIDE HCL 10 MG PO TABS
10.0000 mg | ORAL_TABLET | Freq: Three times a day (TID) | ORAL | Status: DC
  Administered 2014-10-27 (×3): 10 mg via ORAL
  Filled 2014-10-27 (×4): qty 1

## 2014-10-27 MED ORDER — PIPERACILLIN-TAZOBACTAM 3.375 G IVPB: 3.3750 g | Freq: Three times a day (TID) | INTRAVENOUS | Status: DC

## 2014-10-27 MED ORDER — ESCITALOPRAM OXALATE 10 MG PO TABS
10.0000 mg | ORAL_TABLET | Freq: Every day | ORAL | Status: DC
  Administered 2014-10-27 – 2014-11-05 (×9): 10 mg via ORAL
  Filled 2014-10-27 (×9): qty 1

## 2014-10-27 MED ORDER — KCL IN DEXTROSE-NACL 20-5-0.45 MEQ/L-%-% IV SOLN
INTRAVENOUS | Status: DC
  Administered 2014-10-27: 12:00:00 via INTRAVENOUS
  Filled 2014-10-27 (×2): qty 1000

## 2014-10-27 MED ORDER — FLUCONAZOLE 100MG IVPB
100.0000 mg | INTRAVENOUS | Status: DC
  Administered 2014-10-27: 100 mg via INTRAVENOUS
  Filled 2014-10-27 (×2): qty 50

## 2014-10-27 MED ORDER — INSULIN ASPART 100 UNIT/ML ~~LOC~~ SOLN
0.0000 [IU] | Freq: Three times a day (TID) | SUBCUTANEOUS | Status: DC
  Administered 2014-10-27 (×3): 2 [IU] via SUBCUTANEOUS

## 2014-10-27 MED ORDER — NYSTATIN 100000 UNIT/GM EX POWD
Freq: Two times a day (BID) | CUTANEOUS | Status: DC
  Administered 2014-10-27 – 2014-11-05 (×18): via TOPICAL
  Filled 2014-10-27: qty 15

## 2014-10-27 MED ORDER — HEPARIN (PORCINE) IN NACL 100-0.45 UNIT/ML-% IJ SOLN
800.0000 [IU]/h | INTRAMUSCULAR | Status: DC
  Filled 2014-10-27 (×2): qty 250

## 2014-10-27 MED ORDER — CLOPIDOGREL BISULFATE 75 MG PO TABS
75.0000 mg | ORAL_TABLET | Freq: Every day | ORAL | Status: DC
  Administered 2014-10-29 – 2014-11-05 (×8): 75 mg via ORAL
  Filled 2014-10-27 (×10): qty 1

## 2014-10-27 MED ORDER — NYSTATIN 100000 UNIT/ML MT SUSP
5.0000 mL | Freq: Four times a day (QID) | OROMUCOSAL | Status: DC
  Administered 2014-10-29 – 2014-11-05 (×28): 500000 [IU] via ORAL
  Filled 2014-10-27 (×27): qty 5

## 2014-10-27 MED ORDER — GLUCERNA SHAKE PO LIQD
237.0000 mL | Freq: Three times a day (TID) | ORAL | Status: DC
  Filled 2014-10-27 (×19): qty 237

## 2014-10-27 NOTE — Progress Notes (Signed)
PULMONARY / CRITICAL CARE MEDICINE   Name: Bridget Martinez MRN: [id number] DOB: Jun 12, 1951    ADMISSION DATE:  10/26/2014 CONSULTATION DATE:  10/26/14  REFERRING MD :  Dr. Doy Mince, EDP  CHIEF COMPLAINT:  DKA  INITIAL PRESENTATION: [name] with PMH of DM and recent d/c for DKA on 2/16 who was readmitted 2/17 with DKA.    STUDIES:  2/17  CXR >>   SIGNIFICANT EVENTS: 2/16  Discharge from [facility] for DKA  2/17  Re-Admit to [facility] for DKA 2/18  Insulin gtt titrated off.  Troponin positive, heparin gtt initiated.  No chest pain.     SUBJECTIVE:  RN reports troponin positive overnight - heparin gtt initiated.  Insulin gtt transitioned off.      VITAL SIGNS: Temp:  [97.4 F (36.3 C)-98 F (36.7 C)] 98 F (36.7 C) (02/17 2358) Pulse Rate:  [86-119] 86 (02/18 0600) Resp:  [9-33] 14 (02/18 0600) BP: (75-124)/(24-70) 100/30 mmHg (02/18 0600) SpO2:  [93 %-100 %] 98 % (02/18 0600) Weight:  [155 lb 6.8 oz (70.5 kg)] 155 lb 6.8 oz (70.5 kg) (02/17 1613)   HEMODYNAMICS:     VENTILATOR SETTINGS:     INTAKE / OUTPUT:  Intake/Output Summary (Last 24 hours) at 10/27/14 0827 Last data filed at 10/27/14 0606  Gross per 24 hour  Intake 8042.61 ml  Output   1425 ml  Net 6617.61 ml    PHYSICAL EXAMINATION: General:  Chronically ill appearing female in NAD Neuro:  Improved mentation, speech clear HEENT:  MM pink/dry, no jvd, old central line site on L neck c/d/i, R eye with white/opaque area over pupil.  Oral thrush Cardiovascular:  s1s2 rrr, no m/r/g  Lungs:  Even/non-labored, lungs bilaterally clear  Abdomen:  Obese/soft, bsx4 active  Musculoskeletal:  No acute deformities  Skin:  Warm/dry, inguinal folds and peri area erythematous with yeast-like discharge  LABS:  CBC  Recent Labs Lab 10/24/14 0625 10/26/14 1207 10/27/14 0255  WBC 5.3 8.7 10.4  HGB 9.5* 9.8* 7.8*  HCT 27.9* 33.1* 23.3*  PLT 206 358 PLATELET CLUMPS NOTED ON SMEAR, COUNT APPEARS ADEQUATE    Coag's  Recent Labs Lab 10/26/14 2207  INR 1.18   BMET  Recent Labs Lab 10/26/14 2208 10/27/14 0255 10/27/14 0500  NA 144 141 140  K 2.9* 3.3* 3.6  CL 120* 118* 116*  CO2 14* 17* 14*  BUN 30* 28* 28*  CREATININE 2.61* 2.44* 2.44*  GLUCOSE 182* 106* 178*   Electrolytes  Recent Labs Lab 10/21/14 0050  10/24/14 0625 10/25/14 0552  10/26/14 2208 10/27/14 0255 10/27/14 0500  CALCIUM 8.9  < > 8.1* 8.2*  < > 7.2* 7.0* 6.9*  MG 2.0  --   --   --   --   --  1.6  --   PHOS 6.2*  < > 3.2 3.1  --   --  3.1  --   < > = values in this interval not displayed.   Sepsis Markers  Recent Labs Lab 10/26/14 2207 10/26/14 2208 10/27/14 0255  LATICACIDVEN 4.1*  --  0.9  PROCALCITON  --  2.64 2.95     ABG  Recent Labs Lab 10/20/14 2001 10/26/14 1623  PHART 6.952* 7.043*  PCO2ART 12.9* 20.9*  PO2ART 155.0* 124.0*   Liver Enzymes  Recent Labs Lab 10/20/14 1951 10/23/14 0413 10/24/14 0625 10/25/14 0552  AST 30  --   --   --   ALT 17  --   --   --  ALKPHOS 139*  --   --   --   BILITOT 2.1*  --   --   --   ALBUMIN 2.8* 2.2* 2.2* 2.4*   Cardiac Enzymes  Recent Labs Lab 10/26/14 1501 10/26/14 2208 10/27/14 0255  TROPONINI 0.07* 6.02* 12.26*     Glucose  Recent Labs Lab 10/27/14 0058 10/27/14 0158 10/27/14 0247 10/27/14 0359 10/27/14 0505 10/27/14 0758  GLUCAP 90 95 101* 115* 149* 137*    Imaging Portable Chest X-ray (1 View)  10/26/2014   CLINICAL DATA:  Diabetic ketoacidosis.  Hypertension  EXAM: PORTABLE CHEST - 1 VIEW  COMPARISON:  October 21, 2014.  FINDINGS: Lungs are clear. Heart is mildly enlarged with pulmonary vascularity within normal limits. Patient is status post coronary artery bypass grafting. No adenopathy. No bone lesions.  IMPRESSION: Heart mildly enlarged.  No lung edema or consolidation.   Electronically Signed   By: Lowella Grip III M.D.   On: 10/26/2014 15:21     ASSESSMENT / PLAN:  ENDOCRINE A:   Presumed DKA -  small ketones, suspect lack of insulin administration post discharge as contributing factor.  DM-I  Hypothyroidism  P:   DKA protocol  Diabetes Coordinator consult  Continue synthroid IV, 62.5 mcg   PULMONARY OETT n/a  A: No acute process.  Admit CXR clear.  P:   Aspiration precautions  Oxygen as needed to support saturations > 90% Pulmonary hygiene  CARDIOVASCULAR CVL n/a  A:  Hypotension - in the setting of volume depletion with DKA, resolving.  NSTEMI - no new EKG changes, LBB is new for 2016, favor demand ischemia  Hx HTN, HLD, CAD s/p CABG, CHF P:  Hold home coreg Resume crestor, plavix, ASA ASA Heparin gtt Additional troponin am of 2/18 Consider Cardiology consult; at this time unclear if any intervention is appropriate  RENAL A:  Acute on Chronic Kidney Injury - in setting of volume depletion, hypotension and DKA Anion Gap Metabolic Acidosis - in setting of DKA, no additional non-gap component  Hyperkalemia P:   Trend BMP Replace electrolytes as indicated Trend AG D5 1/2NS @ 50 until PO intake improves 1/2NS + KCL @ 100    GASTROINTESTINAL A:   GERD Gastroparesis - has been limiting factor with PO intake per patient, affecting her home regimen of insulin P:   Resume home reglan, PPI Carb modified diet   HEMATOLOGIC A:   Macrocytic Anemia  P:  Consider addition of B12 orally once improved Monitor CBC  Heparin for DVT prophylaxis  Check FOB   INFECTIOUS A:   Inguinal / Peri Yeast Infection  SIRS - r/o infectious etiology  P:   BCx2 2/17 >>  UA 2/17 >> ++glucose, otherwise essentially neg  Vanco, start date 2/17, day 1/1 Zosyn, start date 2/17, day1/1  Diflucan IV, start date 2/17, day 2/3.  Then continue for 7-14 days with oral nystatin swish/swallow Trend PCT   NEUROLOGIC A:   Acute Metabolic Encephalopathy - in setting of AKI / DKA.  Resolving Depression  Deconditioning P:   Resume baseline Lexapro PT consult   FAMILY  -  Updates: No family at bedside, patient updated at bedside.     GLOBAL:  Husband indicates concern for her returning home.  Feels she needs SNF / Rehab efforts after d/c   Bridget Gens, NP-C [facility] Pgr: [phone] or [phone] 10/27/2014, 8:27 AM  Attending Note:  I have examined patient, reviewed labs, studies and notes. I have discussed the  case with B Ollis, and I agree with the data and plans as amended above.   Baltazar Apo, MD, PhD 10/27/2014, 11:15 AM [facility] [phone] or if no answer [phone]

## 2014-10-27 NOTE — Progress Notes (Signed)
ANTICOAGULATION CONSULT NOTE - Initial Consult  Pharmacy Consult for Heparin Indication: chest pain/ACS  Allergies  Allergen Reactions  . Sulfa Antibiotics Anaphylaxis and Swelling    Stiff neck also     Patient Measurements: Height: 5\' 4"  (162.6 cm) Weight: 155 lb 6.8 oz (70.5 kg) IBW/kg (Calculated) : 54.7 Heparin Dosing Weight:   Vital Signs: Temp: 98 F (36.7 C) (02/17 2358) Temp Source: Oral (02/17 2358) BP: 96/26 mmHg (02/18 0000) Pulse Rate: 96 (02/18 0015)  Labs:  Recent Labs  10/24/14 0625  10/26/14 1207 10/26/14 1501 10/26/14 2207 10/26/14 2208 10/27/14 0255  HGB 9.5*  --  9.8*  --   --   --  7.8*  HCT 27.9*  --  33.1*  --   --   --  23.3*  PLT 206  --  358  --   --   --  PLATELET CLUMPS NOTED ON SMEAR, COUNT APPEARS ADEQUATE  LABPROT  --   --   --   --  15.2  --   --   INR  --   --   --   --  1.18  --   --   CREATININE 2.72*  < > 3.47* 2.88*  --  2.61* 2.44*  TROPONINI  --   --   --  0.07*  --  6.02* 12.26*  < > = values in this interval not displayed.  Estimated Creatinine Clearance: 22.4 mL/min (by C-G formula based on Cr of 2.44).   Medical History: Past Medical History  Diagnosis Date  . Diabetes mellitus     on insulin, with h/o DKA   . HTN (hypertension)   . Hyperlipidemia   . Venous stasis ulcers   . Arthritis   . Depression     anxiety  . 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 Hypertension   . Coronary artery disease     angina.  MI.   . Pneumonia        . GERD (gastroesophageal reflux disease)     Hiatal hernia.   . Hypothyroidism   . CHF (congestive heart failure)   . Tuberculosis   . Osteoporosis     Medications:  Infusions:  . 0.45 % NaCl with KCl 20 mEq / L 100 mL/hr at 10/27/14 0000  . dextrose 5 % and 0.45% NaCl 75 mL/hr at 10/27/14 0000  . heparin      Assessment: Patient with troponin still rising.  Last given 5000 units sq heparin ~2300 2/17.    Goal of Therapy:   Heparin level 0.3-0.7 units/ml Monitor platelets by anticoagulation protocol: Yes   Plan:  No bolus Heparin drip at 750 units/hr Daily  CBC Next heparin level at  [address], [place] 10/27/2014,4:16 AM

## 2014-10-27 NOTE — Progress Notes (Signed)
CSW received referral for New SNF for short term rehab.   CSW visited pt room and pt sleeping soundly at this time. Pt husband not present at bedside at this time.  CSW requested PT evaluation order from NP. CSW to await PT recommendation in order to complete full psychosocial assessment. Pt has [employer] which requires PT evaluation and will require authorization for SNF prior to pt discharge.   CSW to continue to follow to provide support and assist with pt discharge planning needs.   Alison Murray, MSW, [place] [facility] [phone]

## 2014-10-27 NOTE — Progress Notes (Signed)
INITIAL NUTRITION ASSESSMENT  DOCUMENTATION CODES Per approved criteria  -Severe malnutrition in the context of chronic illness  Pt meets criteria for severe MALNUTRITION in the context of chronic illness as evidenced by 16% wt loss in <3 months and reported intake <75% for >1 month.  INTERVENTION: - Glucerna Shake po TID, each supplement provides 220 kcal and 10 grams of protein - Provided pt educational materials regarding DM.  - RD will continue to monitor  NUTRITION DIAGNOSIS: Inadequate oral intake related to DKA as evidenced by poor po intake and wt loss.   Goal: Pt to meet >/= 90% of their estimated nutrition needs   Monitor:  Weight trend, po intake, acceptance of supplements, labs  Reason for Assessment: Malnutrition Screening Tool  64 y.o. female  Admitting Dx: <principal problem not specified>  ASSESSMENT: [name] with PMH of DM and recent d/c for DKA on 2/16 who was readmitted 2/17 with DKA.   - Pt falling asleep during RD visit.  - Per chart history, pt has lost 29 lbs in ~3 months.  - Per RN, pt refused breakfast this morning.  - Pt seen by dietitian 2/15 and given diet education.  - Pt was able to wake up long enough to name several foods that contained carbohydrates. She said that she has been making changes to her diet. RD provided handout for pt once she is more awake. - Will send nutritional supplements to improve po intake. - Labs reviewed. Low K.  Height: Ht Readings from Last 1 Encounters:  10/26/14 5\' 4"  (1.626 m)    Weight: Wt Readings from Last 1 Encounters:  10/26/14 155 lb 6.8 oz (70.5 kg)    Ideal Body Weight: 54.7 kg  % Ideal Body Weight: 129%  Wt Readings from Last 10 Encounters:  10/26/14 155 lb 6.8 oz (70.5 kg)  10/24/14 153 lb 3.5 oz (69.5 kg)  10/08/14 145 lb 3.2 oz (65.862 kg)  09/21/14 161 lb 13.1 oz (73.4 kg)  07/28/14 184 lb 1.4 oz (83.5 kg)  12/02/13 160 lb 12.8 oz (72.938 kg)  11/30/13 159 lb 6.3 oz (72.3 kg)   11/18/13 157 lb 3 oz (71.3 kg)  11/10/13 165 lb (74.844 kg)  10/29/13 174 lb 9.7 oz (79.2 kg)    Usual Body Weight: 184 lbs  % Usual Body Weight: 119%  BMI:  Body mass index is 26.67 kg/(m^2).  Estimated Nutritional Needs: Kcal: 1800-2000 Protein: 100-115 g Fluid: 1.8-2.0 L/day  Skin: incision on left upper thigh   Diet Order: Diet Carb Modified  EDUCATION NEEDS: -Education needs addressed   Intake/Output Summary (Last 24 hours) at 10/27/14 1111 Last data filed at 10/27/14 0606  Gross per 24 hour  Intake 8042.61 ml  Output   1425 ml  Net 6617.61 ml    Last BM: prior to admission   Labs:   Recent Labs Lab 10/21/14 0050  10/24/14 0625 10/25/14 0552  10/26/14 2208 10/27/14 0255 10/27/14 0500  NA 142  < > 138 137  < > 144 141 140  K 3.3*  < > 3.8 3.5  < > 2.9* 3.3* 3.6  CL 110  < > 105 103  < > 120* 118* 116*  CO2 6*  < > 23 21  < > 14* 17* 14*  BUN 23  < > 17 18  < > 30* 28* 28*  CREATININE 2.77*  < > 2.72* 2.68*  < > 2.61* 2.44* 2.44*  CALCIUM 8.9  < > 8.1* 8.2*  < >  7.2* 7.0* 6.9*  MG 2.0  --   --   --   --   --  1.6  --   PHOS 6.2*  < > 3.2 3.1  --   --  3.1  --   GLUCOSE 240*  < > 125* 312*  < > 182* 106* 178*  < > = values in this interval not displayed.  CBG (last 3)   Recent Labs  10/27/14 0359 10/27/14 0505 10/27/14 0758  GLUCAP 115* 149* 137*    Scheduled Meds: . aspirin  325 mg Oral Daily  . [START ON 10/28/2014] clopidogrel  75 mg Oral Q breakfast  . escitalopram  10 mg Oral Daily  . fluconazole (DIFLUCAN) IV  100 mg Intravenous Q24H  . insulin aspart  0-15 Units Subcutaneous TID WC  . insulin detemir  5 Units Subcutaneous Q24H  . levothyroxine  62.5 mcg Intravenous Daily  . metoCLOPramide  10 mg Oral TID AC & HS  . [START ON 10/29/2014] nystatin  5 mL Oral QID  . nystatin   Topical BID  . pantoprazole  40 mg Oral Daily  . rosuvastatin  40 mg Oral Daily    Continuous Infusions: . 0.45 % NaCl with KCl 20 mEq / L 100 mL/hr at  10/27/14 0000  . dextrose 5 % and 0.45% NaCl 50 mL/hr at 10/27/14 1034  . heparin 750 Units/hr (10/27/14 0429)    Past Medical History  Diagnosis Date  . Diabetes mellitus     on insulin, with h/o DKA   . HTN (hypertension)   . Hyperlipidemia   . Venous stasis ulcers   . Arthritis   . Depression     anxiety  . 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 Hypertension   . Coronary artery disease     angina.  MI.   . Pneumonia        . GERD (gastroesophageal reflux disease)     Hiatal hernia.   . Hypothyroidism   . CHF (congestive heart failure)   . Tuberculosis   . Osteoporosis     Past Surgical History  Procedure Laterality Date  . Cardiac catheterization    . Tonsillectomy    . Coronary artery bypass graft      Dr. Prescott Gum in 09/2011   . Coronary artery bypass graft  2012  . Esophagogastroduodenoscopy N/A 11/17/2013    Procedure: ESOPHAGOGASTRODUODENOSCOPY (EGD);  Surgeon: Lafayette Dragon, MD;  Location: [facility] ENDOSCOPY;  Service: Endoscopy;  Laterality: N/A;  . Colonoscopy N/A 11/18/2013    Procedure: COLONOSCOPY;  Surgeon: Lafayette Dragon, MD;  Location[facility] ENDOSCOPY;  Service: Endoscopy;  Laterality: N/A;  . Right heart catheterization N/A 07/29/2011    Procedure: RIGHT HEART CATH;  Surgeon: Minus Breeding, MD;  Location: [facility] CATH LAB;  Service: Cardiovascular;  Laterality: N/A;  . Left heart catheterization with coronary angiogram N/A 07/26/2011    Procedure: LEFT HEART CATHETERIZATION WITH CORONARY ANGIOGRAM;  Surgeon: Larey Dresser, MD;  Location: [facility] CATH LAB;  Service: Cardiovascular;  Laterality: N/A;    Laurette Schimke [place], [place], [place]

## 2014-10-27 NOTE — Progress Notes (Signed)
ANTICOAGULATION CONSULT NOTE - Follow Up Consult  Pharmacy Consult for Heparin Indication: r/o ACS  Allergies  Allergen Reactions  . Sulfa Antibiotics Anaphylaxis and Swelling    Stiff neck also     Patient Measurements: Height: 5\' 4"  (162.6 cm) Weight: 155 lb 6.8 oz (70.5 kg) IBW/kg (Calculated) : 54.7 Heparin Dosing Weight: 55 kg  Vital Signs: Temp: 98.3 F (36.8 C) (02/18 0800) Temp Source: Axillary (02/18 0800) BP: 100/30 mmHg (02/18 0600) Pulse Rate: 86 (02/18 0600)  Labs:  Recent Labs  10/26/14 1207 10/26/14 1501 10/26/14 2207 10/26/14 2208 10/27/14 0255 10/27/14 0500  HGB 9.8*  --   --   --  7.8*  --   HCT 33.1*  --   --   --  23.3*  --   PLT 358  --   --   --  PLATELET CLUMPS NOTED ON SMEAR, COUNT APPEARS ADEQUATE  --   LABPROT  --   --  15.2  --   --   --   INR  --   --  1.18  --   --   --   CREATININE 3.47* 2.88*  --  2.61* 2.44* 2.44*  TROPONINI  --  0.07*  --  6.02* 12.26*  --     Estimated Creatinine Clearance: 22.4 mL/min (by C-G formula based on Cr of 2.44).   Medications:  Infusions:  . 0.45 % NaCl with KCl 20 mEq / L 100 mL/hr at 10/27/14 0000  . dextrose 5 % and 0.45% NaCl 75 mL/hr at 10/27/14 0000  . heparin 750 Units/hr (10/27/14 0429)    Assessment: 25 yoF re -ad[facility] to [facility] on [facility]/17 from home for DKA.  She was recently discharged from [facility] on 2/16 but returned when home health RN had "high" CBG readings.  She was also noted to have increasing troponin, without chest pain.  Pharmacy is consulted to dose heparin IV for possible ACS.  Today, 10/27/2014:  Heparin level 0.23, below goal on heparin 750 units/hr.  Lost IV site temporarily; Heparin infusion was held from ~ 11:30-12:00  CBC: Hgb 7.8 (baseline appears to be 9-12), Plt 358 (last 2/17)  No bleeding or complications noted per RN.  SCr 2.44, CrCl ~ 22 ml/min  Troponin:  0.07, 6.02, 12.26, 9.53   Goal of Therapy:  Heparin level 0.3-0.7 units/ml Monitor platelets by  anticoagulation protocol: Yes   Plan:   Increase to heparin IV infusion at 800 units/hr  Heparin level 8 hours after rate change.  Daily heparin level and CBC  Continue to monitor H&H and platelets  Gretta Arab PharmD, BCPS Pager [phone] 10/27/2014 2:41 PM

## 2014-10-27 NOTE — Progress Notes (Signed)
Poor PO intake throughout the day.  Difficult IV access - on heparin gtt + diflucan abx.  Attempt to place an additional IV per IV Team.  Simplify IV fluids to D5 1/2 NS with 20 KCL at 100 ml/hr.     Noe Gens, NP-C [place] [facility] Pgr: [phone] or [phone]

## 2014-10-27 NOTE — Progress Notes (Signed)
[place] Progress Note Patient Name: Bridget Martinez DOB: 1950-10-02 MRN: [id number]   Date of Service  10/27/2014  HPI/Events of Note  Gap closed Troponin still rising Still no complaint of chest pain  eICU Interventions  Add heparin gtt given rising Troponin Convert insulin protocol to levemir + regular Card modified diet     Intervention Category Intermediate Interventions: Hyperglycemia - evaluation and treatment  MCQUAID, DOUGLAS 10/27/2014, 4:09 AM

## 2014-10-27 NOTE — Progress Notes (Signed)
Inpatient Diabetes Program Recommendations  AACE/ADA: New Consensus Statement on Inpatient Glycemic Control (2013)  Target Ranges:  Prepandial:   less than 140 mg/dL      Peak postprandial:   less than 180 mg/dL (1-2 hours)      Critically ill patients:  140 - 180 mg/dL   Reason for Visit: Diabetes Consult  Diabetes history: DM2 Outpatient Diabetes medications: Levemir 7 units QHS and Novolog s/s Current orders for Inpatient glycemic control: Levemir 5 units Q24H and Novolog moderate tidwc  65 y/o F with PMH of DM and recent d/c for DKA on 2/16 who was readmitted 2/17 with DKA.  Pt and husband state Mrs. Wyma took her Levemir around 9 pm on 2/16.   Pt sees Dr. Buddy Duty and states her diabetes is difficult to control. Pt states she left her meter at Dr. Cindra Eves office at last visit and has been using her husband's meter. 5 admissions in past 6 months. Daughter states family may benefit from short-term rehab because pt's husband isn't always available to care for pt.   Results for DEVAYA, VELTRI (MRN [id number]) as of 10/27/2014 16:11  Ref. Range 10/21/2014 00:50  Hemoglobin A1C Latest Range: 4.8-5.6 % 10.5 (H)  Results for ZEN, KEEBLE (MRN [id number]) as of 10/27/2014 16:11  Ref. Range 10/27/2014 02:47 10/27/2014 03:59 10/27/2014 05:05 10/27/2014 07:58 10/27/2014 12:28  Glucose-Capillary Latest Range: 70-99 mg/dL 101 (H) 115 (H) 149 (H) 137 (H) 132 (H)   Results for CHERELLE, RENNER (MRN [id number]) as of 10/27/2014 16:11  Ref. Range 10/27/2014 05:00  Sodium Latest Range: 134-144 mmol/L 140  Potassium Latest Range: 3.5-5.1 mmol/L 3.6  Chloride Latest Range: 96-112 mmol/L 116 (H)  CO2 Latest Range: 19-32 mmol/L 14 (L)  BUN Latest Range: 8-27 mg/dL 28 (H)  Creatinine Latest Range: 0.50-1.10 mg/dL 2.44 (H)  Calcium Latest Range: 8.4-10.5 mg/dL 6.9 (L)  GFR calc non Af Amer Latest Range: >90 mL/min 20 (L)  GFR calc Af Amer Latest Range: >90 mL/min 23 (L)  Glucose Latest Range: 70-99 mg/dL 178  (H)  Anion gap Latest Range: 5-15  10    Per MD note, suspect lack of insulin administration post discharge as contributing factor.  Did not receive basal insulin at hospital prior to discharge on 10/25/2014.  Will likely need to titrate Levemir if FBS > 180 mg/dL. Will need meal coverage insulin when eating at least 50% food. Consider Novolog 3 units tidwc. Would benefit from f/u with endo after discharge.  Will continue to follow while inpatient. Thank you. Lorenda Peck, RD, LDN, CDE Inpatient Diabetes Coordinator [phone]

## 2014-10-27 NOTE — Care Management Note (Signed)
CARE MANAGEMENT NOTE 10/27/2014  Patient:  Bridget Martinez, Bridget Martinez   Account Number:  [id number]  Date Initiated:  10/27/2014  Documentation initiated by:  [name],[name]  Subjective/Objective Assessment:   diabetic with glucose greater than 700     Action/Plan:   home when stable   Anticipated DC Date:  10/30/2014   Anticipated DC Plan:  HOME/SELF CARE  In-house referral  NA      DC Planning Services  NA      PAC Choice  NA   Choice offered to / List presented to:  NA   DME arranged  NA      DME agency  NA     [place] arranged  NA      [place] agency  NA   Status of service:  In process, will continue to follow Medicare Important Message given?   (If response is "NO", the following Medicare IM given date fields will be blank) Date Medicare IM given:   Medicare IM given by:   Date Additional Medicare IM given:   Additional Medicare IM given by:    Discharge Disposition:    Per UR Regulation:  Reviewed for med. necessity/level of care/duration of stay  If discussed at [place] of Stay Meetings, dates discussed:    Comments:  Feb. 18 2016/[name], RN, BSN, CCM/Case Management [place] [phone] No discharge needs present of time of review.

## 2014-10-28 DIAGNOSIS — E1143 Type 2 diabetes mellitus with diabetic autonomic (poly)neuropathy: Secondary | ICD-10-CM

## 2014-10-28 DIAGNOSIS — E43 Unspecified severe protein-calorie malnutrition: Secondary | ICD-10-CM

## 2014-10-28 LAB — GLUCOSE, CAPILLARY
GLUCOSE-CAPILLARY: 190 mg/dL — AB (ref 70–99)
GLUCOSE-CAPILLARY: 131 mg/dL — AB (ref 70–99)
Glucose-Capillary: 575 mg/dL (ref 70–99)
Glucose-Capillary: 548 mg/dL — ABNORMAL HIGH (ref 70–99)
Glucose-Capillary: 482 mg/dL — ABNORMAL HIGH (ref 70–99)
Glucose-Capillary: 454 mg/dL — ABNORMAL HIGH (ref 70–99)
Glucose-Capillary: 445 mg/dL — ABNORMAL HIGH (ref 70–99)
Glucose-Capillary: 386 mg/dL — ABNORMAL HIGH (ref 70–99)
Glucose-Capillary: 266 mg/dL — ABNORMAL HIGH (ref 70–99)
Glucose-Capillary: 226 mg/dL — ABNORMAL HIGH (ref 70–99)
Glucose-Capillary: 172 mg/dL — ABNORMAL HIGH (ref 70–99)
Glucose-Capillary: 85 mg/dL (ref 70–99)

## 2014-10-28 LAB — BASIC METABOLIC PANEL
Anion gap: 18 — ABNORMAL HIGH (ref 5–15)
BUN: 28 mg/dL — AB (ref 6–23)
BUN: 28 mg/dL — ABNORMAL HIGH (ref 6–23)
BUN: 28 mg/dL — ABNORMAL HIGH (ref 6–23)
CHLORIDE: 111 mmol/L (ref 96–112)
CHLORIDE: 116 mmol/L — AB (ref 96–112)
CO2: <5 mmol/L — CL (ref 19–32)
CO2: <5 mmol/L — CL (ref 19–32)
CO2: 5 mmol/L — CL (ref 19–32)
CREATININE: 2.74 mg/dL — AB (ref 0.50–1.10)
Calcium: 7.6 mg/dL — ABNORMAL LOW (ref 8.4–10.5)
Calcium: 7.5 mg/dL — ABNORMAL LOW (ref 8.4–10.5)
Calcium: 7.3 mg/dL — ABNORMAL LOW (ref 8.4–10.5)
Chloride: 111 mmol/L (ref 96–112)
Creatinine, Ser: 2.49 mg/dL — ABNORMAL HIGH (ref 0.50–1.10)
Creatinine, Ser: 2.61 mg/dL — ABNORMAL HIGH (ref 0.50–1.10)
GFR calc Af Amer: 21 mL/min — ABNORMAL LOW (ref >90)
GFR calc non Af Amer: 19 mL/min — ABNORMAL LOW (ref >90)
GFR calc non Af Amer: 18 mL/min — ABNORMAL LOW (ref >90)
GFR, EST AFRICAN AMERICAN: 22 mL/min — AB (ref >90)
GFR, EST AFRICAN AMERICAN: 20 mL/min — AB (ref >90)
GFR, EST NON AFRICAN AMERICAN: 17 mL/min — AB (ref >90)
GLUCOSE: 614 mg/dL — AB (ref 70–99)
GLUCOSE: 312 mg/dL — AB (ref 70–99)
Glucose, Bld: 603 mg/dL (ref 70–99)
POTASSIUM: 5 mmol/L (ref 3.5–5.1)
POTASSIUM: 4.1 mmol/L (ref 3.5–5.1)
Potassium: 5.2 mmol/L — ABNORMAL HIGH (ref 3.5–5.1)
SODIUM: 137 mmol/L (ref 135–145)
SODIUM: 139 mmol/L (ref 135–145)
Sodium: 136 mmol/L (ref 135–145)

## 2014-10-28 LAB — PROCALCITONIN: PROCALCITONIN: 2.28 ng/mL

## 2014-10-28 LAB — HEPATIC FUNCTION PANEL
ALT: 27 U/L (ref 0–35)
AST: 56 U/L — ABNORMAL HIGH (ref 0–37)
Albumin: 2.2 g/dL — ABNORMAL LOW (ref 3.5–5.2)
Alkaline Phosphatase: 143 U/L — ABNORMAL HIGH (ref 39–117)
Bilirubin, Direct: <0.1 mg/dL (ref 0.0–0.5)
TOTAL PROTEIN: 4.8 g/dL — AB (ref 6.0–8.3)
Total Bilirubin: 1 mg/dL (ref 0.3–1.2)

## 2014-10-28 LAB — CBC WITH DIFFERENTIAL/PLATELET
Basophils Absolute: 0 10*3/uL (ref 0.0–0.1)
Basophils Relative: 0 % (ref 0–1)
EOS ABS: 0 10*3/uL (ref 0.0–0.7)
EOS PCT: 0 % (ref 0–5)
HCT: 27.1 % — ABNORMAL LOW (ref 36.0–46.0)
Hemoglobin: 8.7 g/dL — ABNORMAL LOW (ref 12.0–15.0)
LYMPHS ABS: 0.8 10*3/uL (ref 0.7–4.0)
Lymphocytes Relative: 7 % — ABNORMAL LOW (ref 12–46)
MCH: 29.9 pg (ref 26.0–34.0)
MCHC: 32.1 g/dL (ref 30.0–36.0)
MCV: 93.1 fL (ref 78.0–100.0)
MONO ABS: 0.5 10*3/uL (ref 0.1–1.0)
Monocytes Relative: 4 % (ref 3–12)
NEUTROS PCT: 89 % — AB (ref 43–77)
Neutro Abs: 11.3 10*3/uL — ABNORMAL HIGH (ref 1.7–7.7)
Platelets: 320 10*3/uL (ref 150–400)
RBC: 2.91 MIL/uL — ABNORMAL LOW (ref 3.87–5.11)
RDW: 15.5 % (ref 11.5–15.5)
WBC: 12.6 10*3/uL — AB (ref 4.0–10.5)

## 2014-10-28 LAB — PHOSPHORUS: Phosphorus: 4.3 mg/dL (ref 2.3–4.6)

## 2014-10-28 LAB — AMYLASE: Amylase: 85 U/L (ref 0–105)

## 2014-10-28 LAB — HEMOGLOBIN A1C
HEMOGLOBIN A1C: 10 % — AB (ref 4.8–5.6)
Mean Plasma Glucose: 240 mg/dL

## 2014-10-28 LAB — HEPARIN LEVEL (UNFRACTIONATED): Heparin Unfractionated: 0.47 IU/mL (ref 0.30–0.70)

## 2014-10-28 LAB — LIPASE, BLOOD: Lipase: 117 U/L — ABNORMAL HIGH (ref 11–59)

## 2014-10-28 LAB — MAGNESIUM: Magnesium: 1.7 mg/dL (ref 1.5–2.5)

## 2014-10-28 MED ORDER — SODIUM CHLORIDE 0.9 % IV SOLN
INTRAVENOUS | Status: DC
  Administered 2014-10-28 (×2): via INTRAVENOUS

## 2014-10-28 MED ORDER — DEXTROSE-NACL 5-0.45 % IV SOLN
INTRAVENOUS | Status: DC
  Administered 2014-10-28: 08:00:00 via INTRAVENOUS

## 2014-10-28 MED ORDER — SODIUM CHLORIDE 0.9 % IV SOLN
INTRAVENOUS | Status: DC
  Administered 2014-10-28: 5.2 [IU]/h via INTRAVENOUS
  Filled 2014-10-28: qty 2.5

## 2014-10-28 MED ORDER — CETYLPYRIDINIUM CHLORIDE 0.05 % MT LIQD
7.0000 mL | Freq: Two times a day (BID) | OROMUCOSAL | Status: DC
  Administered 2014-10-29 (×2): 7 mL via OROMUCOSAL

## 2014-10-28 MED ORDER — SODIUM CHLORIDE 0.9 % IV BOLUS (SEPSIS)
1500.0000 mL | Freq: Once | INTRAVENOUS | Status: AC
  Administered 2014-10-28: 1500 mL via INTRAVENOUS

## 2014-10-28 MED ORDER — POTASSIUM CHLORIDE 10 MEQ/100ML IV SOLN
10.0000 meq | INTRAVENOUS | Status: AC
  Administered 2014-10-28 (×2): 10 meq via INTRAVENOUS
  Filled 2014-10-28 (×2): qty 100

## 2014-10-28 MED ORDER — SODIUM CHLORIDE 0.9 % IJ SOLN
10.0000 mL | Freq: Two times a day (BID) | INTRAMUSCULAR | Status: DC
  Administered 2014-10-28 – 2014-10-29 (×3): 10 mL

## 2014-10-28 MED ORDER — HEPARIN SODIUM (PORCINE) 5000 UNIT/ML IJ SOLN
5000.0000 [IU] | Freq: Three times a day (TID) | INTRAMUSCULAR | Status: DC
  Administered 2014-10-28 – 2014-10-30 (×7): 5000 [IU] via SUBCUTANEOUS
  Filled 2014-10-28 (×7): qty 1

## 2014-10-28 MED ORDER — FLUCONAZOLE 100MG IVPB
100.0000 mg | INTRAVENOUS | Status: AC
  Administered 2014-10-28: 100 mg via INTRAVENOUS
  Filled 2014-10-28: qty 50

## 2014-10-28 MED ORDER — SODIUM CHLORIDE 0.9 % IJ SOLN
10.0000 mL | INTRAMUSCULAR | Status: DC | PRN
  Administered 2014-10-28: 10 mL
  Filled 2014-10-28: qty 40

## 2014-10-28 MED ORDER — PANTOPRAZOLE SODIUM 40 MG IV SOLR
40.0000 mg | Freq: Every day | INTRAVENOUS | Status: DC
  Administered 2014-10-28 – 2014-11-01 (×5): 40 mg via INTRAVENOUS
  Filled 2014-10-28 (×5): qty 40

## 2014-10-28 MED ORDER — METOCLOPRAMIDE HCL 5 MG/ML IJ SOLN
5.0000 mg | Freq: Four times a day (QID) | INTRAMUSCULAR | Status: DC
  Administered 2014-10-28: 5 mg via INTRAVENOUS
  Filled 2014-10-28: qty 2

## 2014-10-28 MED ORDER — SODIUM CHLORIDE 0.9 % IV SOLN
INTRAVENOUS | Status: AC
  Administered 2014-10-28: 08:00:00 via INTRAVENOUS

## 2014-10-28 MED ORDER — DEXTROSE-NACL 5-0.9 % IV SOLN
INTRAVENOUS | Status: DC
  Administered 2014-10-28: 16:00:00 via INTRAVENOUS

## 2014-10-28 MED ORDER — METOCLOPRAMIDE HCL 5 MG/ML IJ SOLN
10.0000 mg | Freq: Four times a day (QID) | INTRAMUSCULAR | Status: DC
  Administered 2014-10-28 – 2014-10-30 (×8): 10 mg via INTRAVENOUS
  Filled 2014-10-28 (×8): qty 2

## 2014-10-28 NOTE — Progress Notes (Signed)
Peripherally Inserted Central Catheter/Midline Placement  The IV Nurse has discussed with the patient and/or persons authorized to consent for the patient, the purpose of this procedure and the potential benefits and risks involved with this procedure.  The benefits include less needle sticks, lab draws from the catheter and patient may be discharged home with the catheter.  Risks include, but not limited to, infection, bleeding, blood clot (thrombus formation), and puncture of an artery; nerve damage and irregular heat beat.  Alternatives to this procedure were also discussed.  PICC/Midline Placement Documentation  PICC / Midline Double Lumen [id number] PICC Left Basilic 41 cm 0 cm (Active)  Indication for Insertion or Continuance of Line Poor Vasculature-patient has had multiple peripheral attempts or PIVs lasting less than 24 hours 10/28/2014  1:00 PM  Exposed Catheter (cm) 0 cm 10/28/2014  1:00 PM  Dressing Change Due 11/04/14 10/28/2014  1:00 PM       Jule Economy [place] 10/28/2014, 1:36 PM

## 2014-10-28 NOTE — Progress Notes (Signed)
Spoke with GI regarding gastroparesis / NV as it has been significantly impacting her intake and thus making DM management difficult.  She had an EGD in 11/2013 by Dr. Olevia Perches for abdominal pain and no identifiable source of pain found.  At that time, she had a small reducible hernia.  Have asked [facility] to evaluate for further work up of N/V, ? If she would be a candidate for nasal prokinetic study or gastric stimulator (Dr. Derrill Kay @ [facility]).  She has been on reglan and zofran at home and continues to have n/v.  Appreciate GI input.     Bridget Gens, NP-C [facility] Pgr: [phone] or [phone]  Baltazar Apo, MD, PhD 10/28/2014, 5:48 PM [place] [facility] [phone] or if no answer [phone]

## 2014-10-28 NOTE — Progress Notes (Signed)
CRITICAL VALUE ALERT  Critical value received:  CO2 5  Date of notification:  10/28/2014  Time of notification:  [id number]  Critical value read back:Yes.    Nurse who received alert:  Reche Dixon  MD notified (1st page):  [place]  Time of first page:  1523  MD notified (2nd page):  Time of second page:  Responding MD:  Lamonte Sakai  Time MD responded:  [phone]

## 2014-10-28 NOTE — Progress Notes (Signed)
Clinical Social Work Department BRIEF PSYCHOSOCIAL ASSESSMENT 10/28/2014  Patient:  Bridget Martinez, Bridget Martinez     Account Number:  [id number]     Admit date:  10/26/2014  Clinical Social Worker:  [employer] Inez Catalina  Date/Time:  10/28/2014 02:26 PM  Referred by:    Date Referred:  10/28/2014 Referred for  SNF Placement   Other Referral:   Interview type:  Patient Other interview type:    PSYCHOSOCIAL DATA Living Status:  FAMILY Admitted from facility:   Level of care:   Primary support name:  Andris Flurry Primary support relationship to patient:  SPOUSE Degree of support available:   strong    CURRENT CONCERNS Current Concerns  Post-Acute Placement   Other Concerns:    SOCIAL WORK ASSESSMENT / PLAN CSW met with patient at bedside to complete psychosocial assessment. Pt appeared to be tired, pt rn at bedside.CSW introduced self and csw role. Pt and CSW discussed physical therapy recommendations for short term rehab at a skilled nursing facility once medically stable. Pt verbalized agreement with plan to discharge to snf. Patient shared that she prefers [facility] and main preference is [facility] SNF. CSW will complete fl2 for MD signature and initate snf search for [facility] as requested by patient.  Pt gave CSW permission to follow up with pt husband. Patient husband also verbalized understanding of disposition plan at this time. Pt husband inquired about length of stay in snf, and isnsurance coverage. CSW explained that patient participation in therapy will determine patient length of stay and insurance coverage. Once a facility is chosen, they will submit authoirzation and be able to obtain more specifics regarding insurance coverage.    Paitent and patient husband thanked csw for concern and support.   Assessment/plan status:  Psychosocial Support/Ongoing Assessment of Needs Other assessment/ plan:   Information/referral to community resources:   Skilled  nursing facility list offered    PATIENT'S/FAMILY'S RESPONSE TO PLAN OF CARE: Pt and pt husband verbalized understanding of disposition plan. Pt and pt husband looking forward to patient going to rehab to eventually return home once able. Pt interested in placement in [facility] and hopeful for [employer].       Noreene Larsson [phone]  ED CSW 10/28/2014 2:36 PM

## 2014-10-28 NOTE — Progress Notes (Signed)
PULMONARY / CRITICAL CARE MEDICINE   Name: Bridget Martinez MRN: [id number] DOB: 1951/05/02    ADMISSION DATE:  10/26/2014 CONSULTATION DATE:  10/26/14  REFERRING MD :  Dr. Doy Mince, EDP  CHIEF COMPLAINT:  DKA  INITIAL PRESENTATION: [name] with PMH of DM and recent d/c for DKA on 2/16 who was readmitted 2/17 with DKA.    STUDIES:  2/17  CXR >>   SIGNIFICANT EVENTS: 2/16  Discharge from [facility] for DKA  2/17  Re-Admit to [facility] for DKA 2/18  Insulin gtt titrated off.  Troponin positive, heparin gtt initiated.  No chest pain.   SUBJECTIVE:  Note insulin gtt stopped although CO2 was never below 20 Restarted this am Poor toleration of PO's, having emesis    VITAL SIGNS: Temp:  [98.6 F (37 C)-99.8 F (37.7 C)] 98.8 F (37.1 C) (02/19 0800) Pulse Rate:  [75-116] 112 (02/19 0900) Resp:  [12-27] 23 (02/19 0900) SpO2:  [94 %-99 %] 97 % (02/19 0900) Weight:  [76.1 kg (167 lb 12.3 oz)] 76.1 kg (167 lb 12.3 oz) (02/19 0500)   HEMODYNAMICS:     VENTILATOR SETTINGS:     INTAKE / OUTPUT:  Intake/Output Summary (Last 24 hours) at 10/28/14 0932 Last data filed at 10/28/14 [id number]  Gross per 24 hour  Intake 3850.02 ml  Output   1830 ml  Net 2020.02 ml    PHYSICAL EXAMINATION: General:  Chronically ill appearing female in NAD Neuro:  Improved mentation, speech clear HEENT:  MM pink/dry, no jvd, old central line site on L neck c/d/i,  Oral thrush Cardiovascular:  s1s2 rrr, no m/r/g  Lungs:  Even/non-labored, lungs bilaterally clear  Abdomen:  Obese/soft, bsx4 active  Musculoskeletal:  No acute deformities  Skin:  Warm/dry, inguinal folds and peri area erythematous with yeast-like discharge  LABS:  CBC  Recent Labs Lab 10/26/14 1207 10/27/14 0255 10/28/14 0604  WBC 8.7 10.4 12.6*  HGB 9.8* 7.8* 8.7*  HCT 33.1* 23.3* 27.1*  PLT 358 PLATELET CLUMPS NOTED ON SMEAR, COUNT APPEARS ADEQUATE 320   Coag's  Recent Labs Lab 10/26/14 2207  INR 1.18   BMET  Recent  Labs Lab 10/27/14 0255 10/27/14 0500 10/28/14 0604  NA 141 140 137  K 3.3* 3.6 5.0  CL 118* 116* 111  CO2 17* 14* <5*  BUN 28* 28* 28*  CREATININE 2.44* 2.44* 2.49*  GLUCOSE 106* 178* 614*   Electrolytes  Recent Labs Lab 10/25/14 0552  10/27/14 0255 10/27/14 0500 10/28/14 0604  CALCIUM 8.2*  < > 7.0* 6.9* 7.6*  MG  --   --  1.6  --  1.7  PHOS 3.1  --  3.1  --  4.3  < > = values in this interval not displayed.   Sepsis Markers  Recent Labs Lab 10/26/14 2207 10/26/14 2208 10/27/14 0255 10/28/14 0604  LATICACIDVEN 4.1*  --  0.9  --   PROCALCITON  --  2.64 2.95 2.28     ABG  Recent Labs Lab 10/26/14 1623  PHART 7.043*  PCO2ART 20.9*  PO2ART 124.0*   Liver Enzymes  Recent Labs Lab 10/23/14 0413 10/24/14 0625 10/25/14 0552  ALBUMIN 2.2* 2.2* 2.4*   Cardiac Enzymes  Recent Labs Lab 10/26/14 2208 10/27/14 0255 10/27/14 1030  TROPONINI 6.02* 12.26* 9.53*     Glucose  Recent Labs Lab 10/27/14 0505 10/27/14 0758 10/27/14 1228 10/27/14 1636 10/27/14 2115 10/28/14 0754  GLUCAP 149* 137* 132* 148* 166* 575*    Imaging No results found.  ASSESSMENT / PLAN:  ENDOCRINE A:   Presumed DKA / HONC - small ketones, suspect lack of insulin administration post discharge as contributing factor. Also undertreated here, probably stopped insulin gtt too soon DM-I  Hypothyroidism  P:   DKA protocol > restart insulin gtt with no plan to d/c until AG closed and CO2 > 20 More aggressive IVF Diabetes Coordinator consult  Continue synthroid IV, 62.5 mcg   PULMONARY OETT n/a  A: No acute process.  Admit CXR clear.  P:   Aspiration precautions  Oxygen as needed to support saturations > 90% Pulmonary hygiene  CARDIOVASCULAR CVL n/a  A:  Hypotension - in the setting of volume depletion with DKA, resolving.  NSTEMI - no new EKG changes, LBB is new for 2016, favor demand ischemia  Hx HTN, HLD, CAD s/p CABG, CHF P:  Hold home coreg Continue  crestor, plavix, ASA ASA D/c Heparin gtt on 2/19 am Consider Cardiology consult; at this time unclear if any intervention is appropriate  RENAL A:  Acute on Chronic Kidney Injury - in setting of volume depletion, hypotension and DKA Anion Gap Metabolic Acidosis - in setting of DKA, no additional non-gap component  Hyperkalemia P:   Trend BMP Replace electrolytes as indicated Trend AG IVF at 125   GASTROINTESTINAL A:   GERD Gastroparesis - has been limiting factor with PO intake per patient, affecting her home regimen of insulin P:   Resume home reglan, PPI Carb modified diet   HEMATOLOGIC A:   Macrocytic Anemia  P:  Consider addition of B12 orally once improved Monitor CBC  Heparin for DVT prophylaxis  Check FOB   INFECTIOUS A:   Inguinal / Peri Yeast Infection  SIRS - r/o infectious etiology  P:   BCx2 2/17 >>  UA 2/17 >> ++glucose, otherwise essentially neg  Vanco, start date 2/17, day 1/1 Zosyn, start date 2/17, day1/1  Diflucan IV, start date 2/17, day 3/3.  Then continue for 7-14 days with oral nystatin swish/swallow Trend PCT   NEUROLOGIC A:   Acute Metabolic Encephalopathy - in setting of AKI / DKA.  Resolving Depression  Deconditioning P:   Resume baseline Lexapro PT consult   FAMILY  - Updates: No family at bedside, patient updated at bedside.     GLOBAL:  Husband indicates concern for her returning home.  Feels she needs SNF / Rehab efforts after d/c  Independent CC time 35 minutes  Baltazar Apo, MD, PhD 10/28/2014, 9:32 AM [facility] [phone] or if no answer [phone]

## 2014-10-28 NOTE — Progress Notes (Signed)
CRITICAL VALUE ALERT  Critical value received:  Glucose 603 Date of notification:  10/28/2014  Time of notification:  1150  Critical value read back:Yes.    Nurse who received alert:  Reche Dixon  MD notified (1st page):  [place]  Time of first page:  1152  MD notified (2nd page):  Time of second page:  Responding MD:  Lamonte Sakai  Time MD responded:  [id number]

## 2014-10-28 NOTE — Progress Notes (Signed)
Dear Doctor Chase Caller: This patient has been identified as a candidate for PICC for the following reason (s): poor veins/poor circulatory system (CHF, COPD, emphysema, diabetes, steroid use, IV drug abuse, etc.) If you agree, please write an order for the indicated device. For any questions contact the Vascular Access Team at [phone] if no answer, please leave a message.  Thank you for supporting the early vascular access assessment program.

## 2014-10-28 NOTE — Progress Notes (Signed)
CRITICAL VALUE ALERT  Critical value received:  CO2 >5   Date of notification:  10/28/2014  Time of notification:  0703  Critical value read back:Yes.    Nurse who received alert:  Worthy Keeler, RN   MD notified (1st page):  CCM  Time of first page:  [phone]  MD notified (2nd page):  Time of second page:  Responding MD:  Dr. Halford Chessman   Time MD responded:  [phone]  MD will place orders once he reaches the hospital- in route now.

## 2014-10-28 NOTE — Progress Notes (Addendum)
Clinical Social Work Department CLINICAL SOCIAL WORK PLACEMENT NOTE 10/28/2014  Patient:  FIONA, LEGG  Account Number:  [id number] Admit date:  10/26/2014  Clinical Social Worker:  Loletta Specter  Date/time:  10/28/2014 02:36 PM  Clinical Social Work is seeking post-discharge placement for this patient at the following level of care:   [place]   (*CSW will update this form in Epic as items are completed)   10/28/2014  Patient/family provided with [place] Department of Clinical Social Work's list of facilities offering this level of care within the geographic area requested by the patient (or if unable, by the patient's family).  10/28/2014  Patient/family informed of their freedom to choose among providers that offer the needed level of care, that participate in Medicare, Medicaid or managed care program needed by the patient, have an available bed and are willing to accept the patient.  10/28/2014  Patient/family informed of [facility]' ownership interest in [facility], as well as of the fact that they are under no obligation to receive care at this facility.  PASARR submitted to EDS on 10/28/2014 PASARR number received on 10/28/2014  FL2 transmitted to all facilities in geographic area requested by pt/family on  10/28/2014 FL2 transmitted to all facilities within larger geographic area on   Patient informed that his/her managed care company has contracts with or will negotiate with  certain facilities, including the following:     Patient/family informed of bed offers received:  10/30/2014 Patient chooses bed at [facility] and [place] recommends and patient chooses bed at    Patient to be transferred to  on   Patient to be transferred to facility by  Patient and family notified of transfer on  Name of family member notified:    The following physician request were entered in Epic:   Additional Comments: Noreene Larsson [phone]  ED CSW   Alison Murray, MSW, [place] [facility] [phone]

## 2014-10-28 NOTE — Consult Note (Signed)
Referring Provider: [facility] Primary Care Physician:  [name], FNP Primary Gastroenterologist: unassigned  Reason for Consultation:   Gastroparesis, GERD      HPI: Bridget Martinez is a 64 y.o. female with a history of hyperlipidemia, hypertension, coronary artery disease status post CABG, CHF, GERD, tuberculosis, osteoporosis/arthritis, depression and type 1 diabetes. She was admitted to [facility] after coming to the ER via EMS. She had been seen by her home health nurse and her glucose readings were noted to be high. She had previously been admitted to [facility] February 11 16th for DKA secondary to UTI and AKI. She has had trouble regulating her glucose as she has been nauseous and vomiting with gastroparesis.  She had been evaluated on prior admission to [facility] with intermittent right upper quadrant pain, nausea and vomiting. She had a normal gastric emptying scan at that time. She had had a non-contrast CT in mid February 2015 that showed normal gallbladder and bile ducts. Previous abdominal ultrasound in September 2013 was negative. She recently had a CT of the abdomen and pelvis in November 2015 that had no acute findings. She had an upper endoscopy on 11/17/2013 that was normal except for a small hiatal hernia. Colonoscopy on 11/18/2013 was a normal colonoscopy except for a redundant transverse colon.  She reports that over the past several months she has had increased nausea. Her nausea is fairly constant on a daily basis and she has had intermittent episodes of vomiting. She has been feeling full sooner than normal and feels as if the food does not move out of her stomach. She also reports that she has been getting breakthrough heartburn despite using Protonix each morning. She has been experiencing difficulty swallowing solids for the past month and has had several episodes where she's had to spit her food up because it will not go  down.   Past Medical History  Diagnosis Date  . Diabetes mellitus     on insulin, with h/o DKA   . HTN (hypertension)   . Hyperlipidemia   . Venous stasis ulcers   . Arthritis   . Depression     anxiety  . 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 Hypertension   . Coronary artery disease     angina.  MI.   . Pneumonia        . GERD (gastroesophageal reflux disease)     Hiatal hernia.   . Hypothyroidism   . CHF (congestive heart failure)   . Tuberculosis   . Osteoporosis     Past Surgical History  Procedure Laterality Date  . Cardiac catheterization    . Tonsillectomy    . Coronary artery bypass graft      Dr. Prescott Gum in 09/2011   . Coronary artery bypass graft  2012  . Esophagogastroduodenoscopy N/A 11/17/2013    Procedure: ESOPHAGOGASTRODUODENOSCOPY (EGD);  Surgeon: Lafayette Dragon, MD;  Location: [facility] ENDOSCOPY;  Service: Endoscopy;  Laterality: N/A;  . Colonoscopy N/A 11/18/2013    Procedure: COLONOSCOPY;  Surgeon: Lafayette Dragon, MD;  Location: [facility] ENDOSCOPY;  Service: Endoscopy;  Laterality: N/A;  . Right heart catheterization N/A 07/29/2011    Procedure: RIGHT HEART CATH;  Surgeon: Minus Breeding, MD;  [facility] C[facility] LAB;  Service: Cardiovascular;  Laterality: N/A;  . Left heart catheterization with coronary angiogram N/A 07/26/2011    Procedure: LEFT HEART CATHETERIZATION WITH CORONARY ANGIOGRAM;  Surgeon: Kirk Ruths  Claris Gladden, MD;  Location: [facility] CATH LAB;  Service: Cardiovascular;  Laterality: N/A;    Prior to Admission medications   Medication Sig Start Date End Date Taking? Authorizing Provider  carvedilol (COREG) 6.25 MG tablet Take 1 tablet (6.25 mg total) by mouth 2 (two) times daily with a meal. 10/18/14  Yes Jolaine Artist, MD  clopidogrel (PLAVIX) 75 MG tablet Take 1 tablet (75 mg total) by mouth daily with breakfast. 01/12/14  Yes Lysbeth Penner, FNP  escitalopram (LEXAPRO) 10 MG tablet Take 1 tablet (10 mg total) by  mouth daily. 09/27/13  Yes Lysbeth Penner, FNP  insulin aspart (NOVOLOG) 100 UNIT/ML injection Inject 3 Units into the skin 3 (three) times daily with meals. Patient taking differently: Inject 9-20 Units into the skin 3 (three) times daily with meals. On sliding scale 09/21/14  Yes Nishant Dhungel, MD  insulin detemir (LEVEMIR) 100 UNIT/ML injection Inject 0.07 mLs (7 Units total) into the skin daily. 10/25/14  Yes Shanker Kristeen Mans, MD  levothyroxine (SYNTHROID, LEVOTHROID) 125 MCG tablet Take 1 tablet (125 mcg total) by mouth daily. 09/27/13  Yes Lysbeth Penner, FNP  metoCLOPramide (REGLAN) 10 MG tablet Take 1 tablet (10 mg total) by mouth 4 (four) times daily -  before meals and at bedtime. 10/09/14  Yes Debbe Odea, MD  ondansetron (ZOFRAN ODT) 8 MG disintegrating tablet Take 1 tablet (8 mg total) by mouth every 8 (eight) hours as needed for nausea or vomiting. 10/09/14  Yes Debbe Odea, MD  pantoprazole (PROTONIX) 40 MG tablet Take 1 tablet (40 mg total) by mouth daily. 07/31/14 03/30/17 Yes Ripudeep Krystal Eaton, MD  rosuvastatin (CRESTOR) 40 MG tablet Take 1 tablet (40 mg total) by mouth daily. 09/27/13  Yes Lysbeth Penner, FNP  Vitamin D, Ergocalciferol, (DRISDOL) 50000 UNITS CAPS capsule Take 1 capsule by mouth every Saturday.  09/29/13  Yes Historical Provider, MD  aspirin EC 81 MG tablet Take 1 tablet (81 mg total) by mouth daily. Patient not taking: Reported on 10/21/2014 06/15/14   Rande Brunt, NP    Current Facility-Administered Medications  Medication Dose Route Frequency Provider Last Rate Last Dose  . 0.9 %  sodium chloride infusion   Intra-arterial PRN Collene Gobble, MD      . 0.9 %  sodium chloride infusion   Intravenous Continuous Chesley Mires, MD 125 mL/hr at 10/28/14 0859    . antiseptic oral rinse (CPC / CETYLPYRIDINIUM CHLORIDE 0.05%) solution 7 mL  7 mL Mouth Rinse BID Brand Males, MD   7 mL at 10/28/14 1000  . aspirin tablet 325 mg  325 mg Oral Daily Juanito Doom, MD    325 mg at 10/27/14 1026  . clopidogrel (PLAVIX) tablet 75 mg  75 mg Oral Q breakfast Donita Brooks, NP   75 mg at 10/28/14 0800  . dextrose 5 %-0.45 % sodium chloride infusion   Intravenous Continuous Chesley Mires, MD 125 mL/hr at 10/28/14 0743    . escitalopram (LEXAPRO) tablet 10 mg  10 mg Oral Daily Donita Brooks, NP   10 mg at 10/27/14 1027  . feeding supplement (GLUCERNA SHAKE) (GLUCERNA SHAKE) liquid 237 mL  237 mL Oral TID BM Dorann Ou, RD   Stopped at 10/27/14 2000  . fluconazole (DIFLUCAN) IVPB 100 mg  100 mg Intravenous Q24H Collene Gobble, MD   100 mg at 10/28/14 1208  . heparin injection 5,000 Units  5,000 Units Subcutaneous 3 times per day Herbie Baltimore  Agustina Caroli, MD      . insulin regular (NOVOLIN R,HUMULIN R) 250 Units in sodium chloride 0.9 % 250 mL (1 Units/mL) infusion   Intravenous Continuous Chesley Mires, MD 11.8 mL/hr at 10/28/14 1151 11.8 Units/hr at 10/28/14 1151  . levothyroxine (SYNTHROID, LEVOTHROID) injection 62.5 mcg  62.5 mcg Intravenous Daily Donita Brooks, NP   62.5 mcg at 10/28/14 1038  . metoCLOPramide (REGLAN) injection 5 mg  5 mg Intravenous 4 times per day Collene Gobble, MD   5 mg at 10/28/14 1210  . [START ON 10/29/2014] nystatin (MYCOSTATIN) 100000 UNIT/ML suspension 500,000 Units  5 mL Oral QID Donita Brooks, NP      . nystatin (MYCOSTATIN/NYSTOP) topical powder   Topical BID Donita Brooks, NP      . ondansetron (ZOFRAN) injection 4 mg  4 mg Intravenous Q6H PRN Donita Brooks, NP   4 mg at 10/27/14 1332  . pantoprazole (PROTONIX) injection 40 mg  40 mg Intravenous Daily Collene Gobble, MD   40 mg at 10/28/14 1213  . rosuvastatin (CRESTOR) tablet 40 mg  40 mg Oral Daily Donita Brooks, NP   40 mg at 10/27/14 1115    Allergies as of 10/26/2014 - Review Complete 10/26/2014  Allergen Reaction Noted  . Sulfa antibiotics Anaphylaxis and Swelling 07/25/2011    Family History  Problem Relation Age of Onset  . Heart disease Father   . Multiple sclerosis Father    . Hypertension Mother   . Hyperthyroidism Mother   . Diabetes Cousin     Multiple maternal cousins with type 2 diabetes mellitus  . Diabetes Maternal Uncle     Type 1 diabetes mellitus  . Heart attack Paternal Grandfather   . Heart disease Paternal Grandfather     History   Social History  . Marital Status: Married    Spouse Name: Grayland Ormond [phone]; Son Rush Landmark  . Number of Children: 2  . Years of Education: N/A   Occupational History  . Not on file.   Social History Main Topics  . Smoking status: Never Smoker   . Smokeless tobacco: Not on file  . Alcohol Use: No  . Drug Use: No  . Sexual Activity: Yes    Birth Control/ Protection: Post-menopausal   Other Topics Concern  . Not on file   Social History Narrative   ** Merged History Encounter **       Bridget Martinez (Husband): [phone] [employer]) [phone] (Cell)    Review of Systems: Gen: Denies any fever, chills, sweats, anorexia, fatigue, weakness, malaise, weight loss, and sleep disorder CV: Denies chest pain, angina, palpitations, syncope, orthopnea, PND, peripheral edema, and claudication. Resp: Denies dyspnea at rest, dyspnea with exercise, cough, sputum, wheezing, coughing up blood, and pleurisy. GI: Denies vomiting blood, jaundice, and fecal incontinence.   Has had intermittent dysphagia x 2 months. GU : Denies urinary burning, blood in urine, urinary frequency, urinary hesitancy, nocturnal urination, and urinary incontinence. MS: Denies joint pain, limitation of movement, and swelling, stiffness, low back pain, extremity pain. Denies muscle weakness, cramps, atrophy.  Derm: Denies rash, itching, dry skin, hives, moles, warts, or unhealing ulcers.  Psych: Denies depression, anxiety, memory loss, suicidal ideation, hallucinations, paranoia, and confusion. Heme: Denies bruising, bleeding, and enlarged lymph nodes. Neuro:  Denies any headaches, dizziness, paresthesias. Endo:  Denies any problems with DM, thyroid,  adrenal function.  Physical Exam: Vital signs in last 24 hours: Temp:  [98.6 F (37 C)-99.8 F (37.7 C)]  98.8 F (37.1 C) (02/19 0800) Pulse Rate:  [81-116] 111 (02/19 1200) Resp:  [12-27] 20 (02/19 1200) BP: (93-103)/(28-39) 93/28 mmHg (02/19 1200) SpO2:  [94 %-100 %] 100 % (02/19 1200) Weight:  [167 lb 12.3 oz (76.1 kg)] 167 lb 12.3 oz (76.1 kg) (02/19 0500) Last BM Date: 10/27/14 General:   Alert, ill appearing, pleasant and cooperative in NAD Head:  Normocephalic and atraumatic. Eyes:  Sclera clear, no icterus.   Conjunctiva pink. Ears:  Normal auditory acuity. Nose:  No deformity, discharge,  or lesions. Mouth:  No deformity or lesions.  Mucus membranes dry Neck:  Supple; no masses or thyromegaly. Lungs:  Clear throughout to auscultation.    Heart:  Regular rate and rhythm Abdomen:  Soft,nontender, BS active,nonpalp mass or hsm.   Rectal:  Deferred  Msk:  Symmetrical without gross deformities. . Pulses:  Normal pulses noted. Extremities:  Without clubbing or edema. Neurologic:  Alert and  oriented x4 Skin:  Intact without significant lesions or rashes.. Psych:  Alert and cooperative. Normal mood and affect.  Intake/Output from previous day: 02/18 0701 - 02/19 0700 In: 3026.2 [P.O.:120; I.V.:2856.2; IV Piggyback:50] Out: [id number] [Urine:1830] Intake/Output this shift: Total I/O In: 2794 [I.V.:979; Other:240; IV [id number] Out: -   Lab Results:  Recent Labs  10/26/14 1207 10/27/14 0255 10/28/14 0604  WBC 8.7 10.4 12.6*  HGB 9.8* 7.8* 8.7*  HCT 33.1* 23.3* 27.1*  PLT 358 PLATELET CLUMPS NOTED ON SMEAR, COUNT APPEARS ADEQUATE 320   BMET  Recent Labs  10/27/14 0500 10/28/14 0604 10/28/14 1032  NA 140 137 136  K 3.6 5.0 5.2*  CL 116* 111 111  CO2 14* <5* <5*  GLUCOSE 178* 614* 603*  BUN 28* 28* 28*  CREATININE 2.44* 2.49* 2.74*  CALCIUM 6.9* 7.6* 7.5*   LFT  PT/INR  Recent Labs  10/26/14 2207  LABPROT 15.2  INR 1.18   Lipase 554 on  10/26/14 Amylase 335 on 10/26/14  HgbA1C on 10/26/14 10.0  PROCEDURES: EGD on 11/17/2013 was an essentially normal upper endoscopy with a small reducible hiatal hernia Colonoscopy on 11/18/2013 had a normal colon, redundant transverse colon. Studies/Results: CLINICAL DATA Abdominal pain  EXAM NUCLEAR MEDICINE HEPATOBILIARY IMAGING  TECHNIQUE Sequential images of the abdomen were obtained out to 60 minutes following intravenous administration of radiopharmaceutical.  RADIOPHARMACEUTICALS 5.0 MCiTc-61m Choletec  COMPARISON NM HEPATO W/GB/PHARM/QUAN MEASUR dated 08/08/2011  FINDINGS There is immediate homogeneous uptake of radiotracer in the liver. Filling of the gallbladder begins at 35 minutes. Radiotracer uptake is present in the small bowel at 20 minutes.  At the end of 1-hour, there is relatively good clearance of activity from the liver.  IMPRESSION No biliary obstruction.  NICAL DATA: Nausea, vomiting, and abdominal pain.  EXAM: NUCLEAR MEDICINE GASTRIC EMPTYING SCAN  TECHNIQUE: After oral ingestion of radiolabeled meal, sequential abdominal images were obtained for 120 minutes. Residual percentage of activity remaining within the stomach was calculated at 60 and 120 minutes.  COMPARISON: None.  RADIOPHARMACEUTICALS: 2.0 mCi ofTechnetium 99-m labeled sulfur colloid  FINDINGS: Expected location of the stomach in the left upper quadrant. Ingested meal empties the stomach gradually over the course of the study with 5% retention at 60 min and 2% retention at 120 min (normal retention less than 30% at a 120 min).  IMPRESSION: Normal gastric emptying study.   Electronically Signed  By: Rozetta Nunnery M.D.  On: 11/15/2013 13:40  CLINICAL DATA: Vomiting.  EXAM: PORTABLE ABDOMEN - 2 VIEW  COMPARISON: July 27, 2014.  FINDINGS: The bowel gas pattern is normal. There is no evidence of free air. No radio-opaque calculi or  other significant radiographic abnormality is seen. Stool is noted in the rectum.  IMPRESSION: No evidence of bowel obstruction or ileus.   Electronically Signed  By: Marijo Conception, M.D.  On: 10/21/2014 08:52  Portable Chest X-ray (1 View)  10/26/2014   CLINICAL DATA:  Diabetic ketoacidosis.  Hypertension  EXAM: PORTABLE CHEST - 1 VIEW  COMPARISON:  October 21, 2014.  FINDINGS: Lungs are clear. Heart is mildly enlarged with pulmonary vascularity within normal limits. Patient is status post coronary artery bypass grafting. No adenopathy. No bone lesions.  IMPRESSION: Heart mildly enlarged.  No lung edema or consolidation.   Electronically Signed   By: Lowella Grip III M.D.   On: 10/26/2014 15:21   IMPRESSION/PLAN: 64 year old female with history of type 1 diabetes admitted with presumed DKA.  Suspect lack of insulin use post discharge was a contributing factor. Patient states she was using less insulin because she was afraid her sugars would go too low. Most recent A1c was 10. Gastric emptying likely directly affected from poor glycemic control. Optimization of glycemic control is essential both short and long-term. Would avoid any narcotics and anticholinergics. Will increase Reglan to 10 mg every 6 hours. Continue Zofran and Protonix. Patient may benefit from addition of erythromycin 250 mg IV 4 times a day( when off other antibiotics) as this may increase gastric motility. Prochlorperazine 5-25 mg suppository rectally bid may help as well. Patient has been reporting some dysphagia and this may be secondary to poorly controlled reflux. Will increase PPI to twice a day, and if no improvement, consider esophogram. Patient likely would not be a candidate for motility clinic at [facility] until glycemic control is optimized.? If lexapro may be exacerbating symptoms as it can cause nausea and dyspepsia.  Pancreatic enzyme elevation likely reactive to  kidney disease. Will follow LFTs and  pancreatic enzymes.    Hvozdovic, Deloris Ping 10/28/2014,  Pager [phone]  Agree with Ms. Hvozdovic's note. Main problem for this lady is poor glycemic control which is likely the cause of gastric dysfx and motility disturbance. Elevated amylase likely related to DKA false elevation/renal clearance.  We will f/u tomorrow.  Gatha Mayer, MD, [facility] [phone] (pager) 10/28/2014 5:03 PM

## 2014-10-28 NOTE — Progress Notes (Signed)
CRITICAL VALUE ALERT  Critical value received:  CO2 <5  Date of notification:  10/28/2014  Time of notification:  [id number]  Critical value read back:Yes.    Nurse who received alert:  Reche Dixon  MD notified (1st page):  [place]  Time of first page:  1152  MD notified (2nd page):  Time of second page:  Responding MD:  Lamonte Sakai  Time MD responded:  [id number]

## 2014-10-28 NOTE — Evaluation (Signed)
Physical Therapy Evaluation Patient Details Name: Bridget Martinez MRN: [id number] DOB: 1951/07/05 Today's Date: 10/28/2014   History of Present Illness  [name], never smoker, with PMH of HTN, HLD, CAD s/p CABG, CHF, GERD, TB, osteoporosis / arthritis, depression and DM-1 (since age 34) who presented to [facility] on 2/17 via EMS after being seen by the home health RN with hyperglycemia. REcent hospitalization at [facility] 2/11-2/16/16 for DKA.  Clinical Impression  *Pt admitted with above diagnosis. Pt currently with functional limitations due to the deficits listed below (see PT Problem List). * Pt will benefit from skilled PT to increase their independence and safety with mobility to allow discharge to the venue listed below.    Mod assist for supine to sit, pt became nauseous in sitting so assisted her back to bed. Will follow and progress mobility as tolerated.  **    Follow Up Recommendations SNF;Supervision/Assistance - 24 hour    Equipment Recommendations  None recommended by PT    Recommendations for Other Services       Precautions / Restrictions Precautions Precautions: Fall Restrictions Weight Bearing Restrictions: No      Mobility  Bed Mobility Overal bed mobility: Needs Assistance Bed Mobility: Supine to Sit;Sit to Supine     Supine to sit: Mod assist Sit to supine: Max assist   General bed mobility comments: Mod assist to advance BLEs and to raise trunk. Pt became nauseous in sitting so assisted her back to supine.   Transfers                    Ambulation/Gait                Stairs            Wheelchair Mobility    Modified Rankin (Stroke Patients Only)       Balance Overall balance assessment: History of Falls;Needs assistance   Sitting balance-Leahy Scale: Fair                                       Pertinent Vitals/Pain Pain Assessment: No/denies pain    Home Living Family/patient  expects to be discharged to:: Private residence Living Arrangements: Spouse/significant other Available Help at Discharge: Family;Available 24 hours/day Type of Home: House Home Access: Stairs to enter Entrance Stairs-Rails: None Entrance Stairs-Number of Steps: 3 Home Layout: One level Home Equipment: Walker - 2 wheels;Cane - single point;Wheelchair - [employer];Shower seat      Prior Function Level of Independence: Independent         Comments: Drives     Hand Dominance        Extremity/Trunk Assessment   Upper Extremity Assessment: Defer to OT evaluation           Lower Extremity Assessment: Generalized weakness      Cervical / Trunk Assessment: Normal  Communication   Communication: No difficulties  Cognition Arousal/Alertness: Lethargic Behavior During Therapy: WFL for tasks assessed/performed Overall Cognitive Status: Within Functional Limits for tasks assessed                      General Comments      Exercises        Assessment/Plan    PT Assessment Patient needs continued PT services  PT Diagnosis Difficulty walking;Generalized weakness   PT Problem List Decreased strength;Decreased activity tolerance;Decreased balance;Decreased  mobility  PT Treatment Interventions DME instruction;Gait training;Stair training;Functional mobility training;Therapeutic activities;Therapeutic exercise;Neuromuscular re-education;Patient/family education   PT Goals (Current goals can be found in the Care Plan section) Acute Rehab PT Goals Patient Stated Goal: none stated, pt lethargic PT Goal Formulation: With patient/family Time For Goal Achievement: 11/11/14 Potential to Achieve Goals: Fair    Frequency Min 3X/week   Barriers to discharge        Co-evaluation               End of Session   Activity Tolerance: Treatment limited secondary to medical complications (Comment);Patient limited by fatigue Patient left: in bed;with  call bell/phone within reach Nurse Communication: Mobility status         Time: 1130-1143 PT Time Calculation (min) (ACUTE ONLY): 13 min   Charges:   PT Evaluation $Initial PT Evaluation Tier I: 1 Procedure     PT G Codes:        Philomena Doheny 10/28/2014, 12:31 PM [phone]

## 2014-10-28 NOTE — Progress Notes (Signed)
Called with AM labs:  BMP Latest Ref Rng 10/28/2014 10/27/2014 10/27/2014  Glucose 70 - 99 mg/dL 614(HH) 178(H) 106(H)  BUN 6 - 23 mg/dL 28(H) 28(H) 28(H)  Creatinine 0.50 - 1.10 mg/dL 2.49(H) 2.44(H) 2.44(H)  BUN/Creat Ratio 11 - 26 - - -  Sodium 135 - 145 mmol/L 137 140 141  Potassium 3.5 - 5.1 mmol/L 5.0 3.6 3.3(L)  Chloride 96 - 112 mmol/L 111 116(H) 118(H)  CO2 19 - 32 mmol/L <5(LL) 14(L) 17(L)  Calcium 8.4 - 10.5 mg/dL 7.6(L) 6.9(L) 7.0(L)    She was transitioned off insulin gtt last night.  Will restart insulin gtt per DKA protocol, change IV fluids to NS, and f/u BMET.  Chesley Mires, MD [facility] Pulmonary/Critical Care 10/28/2014, 7:29 AM Pager:  [phone] After 3pm call: [phone]

## 2014-10-28 NOTE — Progress Notes (Signed)
ANTICOAGULATION CONSULT NOTE - Follow Up Consult  Pharmacy Consult for Heparin Indication: chest pain/ACS  Allergies  Allergen Reactions  . Sulfa Antibiotics Anaphylaxis and Swelling    Stiff neck also     Patient Measurements: Height: 5\' 4"  (162.6 cm) Weight: 155 lb 6.8 oz (70.5 kg) IBW/kg (Calculated) : 54.7 Heparin Dosing Weight:   Vital Signs: Temp: 98.9 F (37.2 C) (02/18 2314) Temp Source: Oral (02/18 2314) Pulse Rate: 97 (02/19 0200)  Labs:  Recent Labs  10/26/14 1207  10/26/14 2207 10/26/14 2208 10/27/14 0255 10/27/14 0500 10/27/14 1030 10/27/14 1330 10/28/14 0040  HGB 9.8*  --   --   --  7.8*  --   --   --   --   HCT 33.1*  --   --   --  23.3*  --   --   --   --   PLT 358  --   --   --  PLATELET CLUMPS NOTED ON SMEAR, COUNT APPEARS ADEQUATE  --   --   --   --   LABPROT  --   --  15.2  --   --   --   --   --   --   INR  --   --  1.18  --   --   --   --   --   --   HEPARINUNFRC  --   --   --   --   --   --   --  0.23* 0.47  CREATININE 3.47*  < >  --  2.61* 2.44* 2.44*  --   --   --   TROPONINI  --   < >  --  6.02* 12.26*  --  9.53*  --   --   < > = values in this interval not displayed.  Estimated Creatinine Clearance: 22.4 mL/min (by C-G formula based on Cr of 2.44).   Medications:  Infusions:  . dextrose 5 % and 0.45 % NaCl with KCl 20 mEq/L 100 mL/hr at 10/28/14 0300  . heparin 800 Units/hr (10/27/14 2300)    Assessment: Patient with heparin level at goal.  No issues per RN.  Goal of Therapy:  Heparin level 0.3-0.7 units/ml Monitor platelets by anticoagulation protocol: Yes   Plan:  Continue heparin drip at current rate Recheck level at 0800  Tyler Deis, [place] 10/28/2014,5:03 AM

## 2014-10-28 NOTE — Progress Notes (Signed)
CRITICAL VALUE ALERT  Critical value received:  Glucose 614  Date of notification:  10/28/2014  Time of notification:  0703  Critical value read back:Yes.    Nurse who received alert:  Worthy Keeler, RN  MD notified (1st page):  CCM   Time of first page:  0710  MD notified (2nd page):  Time of second page:  Responding MD:  Dr. Halford Chessman  Time MD responded:  0710  MD en route to hospital, will place orders once he is able to review chart.

## 2014-10-29 DIAGNOSIS — I2510 Atherosclerotic heart disease of native coronary artery without angina pectoris: Secondary | ICD-10-CM

## 2014-10-29 DIAGNOSIS — R011 Cardiac murmur, unspecified: Secondary | ICD-10-CM

## 2014-10-29 LAB — CBC WITH DIFFERENTIAL/PLATELET
Basophils Absolute: 0 10*3/uL (ref 0.0–0.1)
Basophils Relative: 0 % (ref 0–1)
EOS ABS: 0 10*3/uL (ref 0.0–0.7)
Eosinophils Relative: 0 % (ref 0–5)
HEMATOCRIT: 22.8 % — AB (ref 36.0–46.0)
HEMOGLOBIN: 7.7 g/dL — AB (ref 12.0–15.0)
LYMPHS PCT: 19 % (ref 12–46)
Lymphs Abs: 1.1 10*3/uL (ref 0.7–4.0)
MCH: 30.4 pg (ref 26.0–34.0)
MCHC: 33.8 g/dL (ref 30.0–36.0)
MCV: 90.1 fL (ref 78.0–100.0)
Monocytes Absolute: 0.7 10*3/uL (ref 0.1–1.0)
Monocytes Relative: 13 % — ABNORMAL HIGH (ref 3–12)
NEUTROS ABS: 3.7 10*3/uL (ref 1.7–7.7)
Neutrophils Relative %: 68 % (ref 43–77)
PLATELETS: 247 10*3/uL (ref 150–400)
RBC: 2.53 MIL/uL — ABNORMAL LOW (ref 3.87–5.11)
RDW: 15.2 % (ref 11.5–15.5)
WBC: 5.5 10*3/uL (ref 4.0–10.5)

## 2014-10-29 LAB — BASIC METABOLIC PANEL
ANION GAP: 8 (ref 5–15)
ANION GAP: 9 (ref 5–15)
ANION GAP: 9 (ref 5–15)
Anion gap: 0 — ABNORMAL LOW (ref 5–15)
Anion gap: 5 (ref 5–15)
Anion gap: 9 (ref 5–15)
BUN: 27 mg/dL — ABNORMAL HIGH (ref 6–23)
BUN: 24 mg/dL — ABNORMAL HIGH (ref 6–23)
BUN: 26 mg/dL — ABNORMAL HIGH (ref 6–23)
BUN: 24 mg/dL — ABNORMAL HIGH (ref 6–23)
BUN: 22 mg/dL (ref 6–23)
BUN: 22 mg/dL (ref 6–23)
CALCIUM: 7.5 mg/dL — AB (ref 8.4–10.5)
CALCIUM: 7.2 mg/dL — AB (ref 8.4–10.5)
CALCIUM: 7 mg/dL — AB (ref 8.4–10.5)
CHLORIDE: 120 mmol/L — AB (ref 96–112)
CHLORIDE: 115 mmol/L — AB (ref 96–112)
CO2: 13 mmol/L — AB (ref 19–32)
CO2: 13 mmol/L — ABNORMAL LOW (ref 19–32)
CO2: 16 mmol/L — ABNORMAL LOW (ref 19–32)
CO2: 15 mmol/L — AB (ref 19–32)
CO2: 13 mmol/L — AB (ref 19–32)
CO2: 16 mmol/L — ABNORMAL LOW (ref 19–32)
CREATININE: 2.48 mg/dL — AB (ref 0.50–1.10)
CREATININE: 2.47 mg/dL — AB (ref 0.50–1.10)
CREATININE: 2.26 mg/dL — AB (ref 0.50–1.10)
Calcium: 7.5 mg/dL — ABNORMAL LOW (ref 8.4–10.5)
Calcium: 7.2 mg/dL — ABNORMAL LOW (ref 8.4–10.5)
Calcium: 7.5 mg/dL — ABNORMAL LOW (ref 8.4–10.5)
Chloride: 120 mmol/L — ABNORMAL HIGH (ref 96–112)
Chloride: 125 mmol/L — ABNORMAL HIGH (ref 96–112)
Chloride: 120 mmol/L — ABNORMAL HIGH (ref 96–112)
Chloride: 119 mmol/L — ABNORMAL HIGH (ref 96–112)
Creatinine, Ser: 2.46 mg/dL — ABNORMAL HIGH (ref 0.50–1.10)
Creatinine, Ser: 2.36 mg/dL — ABNORMAL HIGH (ref 0.50–1.10)
Creatinine, Ser: 2.32 mg/dL — ABNORMAL HIGH (ref 0.50–1.10)
GFR calc Af Amer: 23 mL/min — ABNORMAL LOW (ref >90)
GFR calc Af Amer: 24 mL/min — ABNORMAL LOW (ref >90)
GFR calc Af Amer: 24 mL/min — ABNORMAL LOW (ref >90)
GFR calc non Af Amer: 19 mL/min — ABNORMAL LOW (ref >90)
GFR calc non Af Amer: 20 mL/min — ABNORMAL LOW (ref >90)
GFR calc non Af Amer: 21 mL/min — ABNORMAL LOW (ref >90)
GFR calc non Af Amer: 21 mL/min — ABNORMAL LOW (ref >90)
GFR, EST AFRICAN AMERICAN: 23 mL/min — AB (ref >90)
GFR, EST AFRICAN AMERICAN: 23 mL/min — AB (ref >90)
GFR, EST AFRICAN AMERICAN: 25 mL/min — AB (ref >90)
GFR, EST NON AFRICAN AMERICAN: 20 mL/min — AB (ref >90)
GFR, EST NON AFRICAN AMERICAN: 22 mL/min — AB (ref >90)
GLUCOSE: 130 mg/dL — AB (ref 70–99)
GLUCOSE: 202 mg/dL — AB (ref 70–99)
Glucose, Bld: 141 mg/dL — ABNORMAL HIGH (ref 70–99)
Glucose, Bld: 85 mg/dL (ref 70–99)
Glucose, Bld: 217 mg/dL — ABNORMAL HIGH (ref 70–99)
Glucose, Bld: 475 mg/dL — ABNORMAL HIGH (ref 70–99)
POTASSIUM: 3.7 mmol/L (ref 3.5–5.1)
POTASSIUM: 3.4 mmol/L — AB (ref 3.5–5.1)
POTASSIUM: 3 mmol/L — AB (ref 3.5–5.1)
POTASSIUM: 4.6 mmol/L (ref 3.5–5.1)
Potassium: 3.5 mmol/L (ref 3.5–5.1)
Potassium: 3.5 mmol/L (ref 3.5–5.1)
SODIUM: 141 mmol/L (ref 135–145)
SODIUM: 141 mmol/L (ref 135–145)
SODIUM: 137 mmol/L (ref 135–145)
Sodium: 142 mmol/L (ref 135–145)
Sodium: 140 mmol/L (ref 135–145)
Sodium: 144 mmol/L (ref 135–145)

## 2014-10-29 LAB — GLUCOSE, CAPILLARY
GLUCOSE-CAPILLARY: 101 mg/dL — AB (ref 70–99)
GLUCOSE-CAPILLARY: 104 mg/dL — AB (ref 70–99)
GLUCOSE-CAPILLARY: 140 mg/dL — AB (ref 70–99)
GLUCOSE-CAPILLARY: 157 mg/dL — AB (ref 70–99)
GLUCOSE-CAPILLARY: 183 mg/dL — AB (ref 70–99)
Glucose-Capillary: 123 mg/dL — ABNORMAL HIGH (ref 70–99)
Glucose-Capillary: 126 mg/dL — ABNORMAL HIGH (ref 70–99)
Glucose-Capillary: 128 mg/dL — ABNORMAL HIGH (ref 70–99)
Glucose-Capillary: 153 mg/dL — ABNORMAL HIGH (ref 70–99)
Glucose-Capillary: 164 mg/dL — ABNORMAL HIGH (ref 70–99)
Glucose-Capillary: 172 mg/dL — ABNORMAL HIGH (ref 70–99)
Glucose-Capillary: 223 mg/dL — ABNORMAL HIGH (ref 70–99)
Glucose-Capillary: 350 mg/dL — ABNORMAL HIGH (ref 70–99)
Glucose-Capillary: 84 mg/dL (ref 70–99)

## 2014-10-29 LAB — BLOOD GAS, ARTERIAL
Acid-base deficit: 14.8 mmol/L — ABNORMAL HIGH (ref 0.0–2.0)
BICARBONATE: 10.4 meq/L — AB (ref 20.0–24.0)
FIO2: 0.21 %
O2 Saturation: 97.3 %
PH ART: 7.283 — AB (ref 7.350–7.450)
Patient temperature: 98.6
TCO2: 10.1 mmol/L (ref 0–100)
pCO2 arterial: 22.7 mmHg — ABNORMAL LOW (ref 35.0–45.0)
pO2, Arterial: 113 mmHg — ABNORMAL HIGH (ref 80.0–100.0)

## 2014-10-29 LAB — PHOSPHORUS: PHOSPHORUS: 3.1 mg/dL (ref 2.3–4.6)

## 2014-10-29 LAB — LACTIC ACID, PLASMA: LACTIC ACID, VENOUS: 3.1 mmol/L — AB (ref 0.5–2.0)

## 2014-10-29 LAB — MAGNESIUM: MAGNESIUM: 1.5 mg/dL (ref 1.5–2.5)

## 2014-10-29 LAB — KETONES, URINE: KETONES UR: NEGATIVE mg/dL

## 2014-10-29 MED ORDER — MAGNESIUM SULFATE 2 GM/50ML IV SOLN
2.0000 g | Freq: Once | INTRAVENOUS | Status: AC
  Administered 2014-10-29: 2 g via INTRAVENOUS
  Filled 2014-10-29: qty 50

## 2014-10-29 MED ORDER — SODIUM BICARBONATE 650 MG PO TABS
650.0000 mg | ORAL_TABLET | Freq: Three times a day (TID) | ORAL | Status: DC
  Administered 2014-10-29: 650 mg via ORAL
  Filled 2014-10-29: qty 1

## 2014-10-29 MED ORDER — DEXTROSE 5 % IV SOLN: INTRAVENOUS | Status: DC

## 2014-10-29 MED ORDER — POTASSIUM CHLORIDE 10 MEQ/100ML IV SOLN
INTRAVENOUS | Status: AC
  Filled 2014-10-29: qty 100

## 2014-10-29 MED ORDER — SODIUM CHLORIDE 0.9 % IV SOLN
INTRAVENOUS | Status: DC
  Administered 2014-10-29: 10 mL/h via INTRAVENOUS
  Administered 2014-11-01: 17:00:00 via INTRAVENOUS

## 2014-10-29 MED ORDER — DEXTROSE-NACL 5-0.45 % IV SOLN
INTRAVENOUS | Status: DC
  Administered 2014-10-29 (×2): via INTRAVENOUS

## 2014-10-29 MED ORDER — GLYCERIN (LAXATIVE) 2.1 G RE SUPP
1.0000 | Freq: Two times a day (BID) | RECTAL | Status: DC
  Administered 2014-10-30: 1 via RECTAL
  Filled 2014-10-29 (×16): qty 1

## 2014-10-29 MED ORDER — INSULIN GLARGINE 100 UNIT/ML ~~LOC~~ SOLN
10.0000 [IU] | Freq: Every day | SUBCUTANEOUS | Status: DC
  Administered 2014-10-29 (×2): 10 [IU] via SUBCUTANEOUS
  Filled 2014-10-29 (×2): qty 0.1

## 2014-10-29 MED ORDER — POTASSIUM CHLORIDE 10 MEQ/100ML IV SOLN
10.0000 meq | INTRAVENOUS | Status: AC
  Administered 2014-10-29 (×2): 10 meq via INTRAVENOUS

## 2014-10-29 MED ORDER — DEXTROSE 5 % IV SOLN
INTRAVENOUS | Status: DC
  Administered 2014-10-29 – 2014-10-30 (×2): via INTRAVENOUS
  Filled 2014-10-29 (×4): qty 100

## 2014-10-29 MED ORDER — INSULIN ASPART 100 UNIT/ML ~~LOC~~ SOLN
0.0000 [IU] | SUBCUTANEOUS | Status: DC
  Administered 2014-10-29 (×2): 3 [IU] via SUBCUTANEOUS
  Administered 2014-10-29: 5 [IU] via SUBCUTANEOUS
  Administered 2014-10-29 (×2): 3 [IU] via SUBCUTANEOUS
  Administered 2014-10-30: 2 [IU] via SUBCUTANEOUS

## 2014-10-29 NOTE — Progress Notes (Signed)
CRITICAL VALUE ALERT  Critical value received: Lactic acid 3.1  Date of notification:  10/29/2014  Time of notification:  [id number]  Critical value read back:yes  Nurse who received alert:  Clarene Duke RN  MD notified (1st page): 1615  Time of first page:  1615  MD notified (2nd page):  Time of second page:  Responding MD: E link MD  Time MD responded:  [phone]

## 2014-10-29 NOTE — Progress Notes (Signed)
PULMONARY / CRITICAL CARE MEDICINE   Name: Bridget Martinez MRN: [id number] DOB: [birth year]    ADMISSION DATE:  10/26/2014 CONSULTATION DATE:  10/26/14  REFERRING MD :  Dr. Doy Mince, EDP  CHIEF COMPLAINT:  DKA  INITIAL PRESENTATION: [name] with PMH of DM and recent d/c for DKA on 2/16 who was readmitted 2/17 with DKA.    STUDIES:  2/17  CXR >>   SIGNIFICANT EVENTS: 2/16  Discharge from [facility] for DKA  2/17  Re-Admit to [facility] for DKA 2/18  Insulin gtt titrated off.  Troponin positive, heparin gtt initiated.  No chest pain.  2/19:  Note insulin gtt stopped although CO2 was never below 20. Restarted this am. Poor toleration of PO's, having emesis    SUBJECTIVE/OVERNIGHT/INTERVAL HX 10/29/14: Off insuling gtt overnight though bic 15 - due to rationale low bic due to CRI and saline resus. Started on lantus. Still on D5. Having nausea this AM due to gi dysmotility - glycerin suppository added. RN says nauseae/emesis so severe unable to keep POs down    VITAL SIGNS: Temp:  [97.9 F (36.6 C)-98.5 F (36.9 C)] 98.5 F (36.9 C) (02/20 0830) Pulse Rate:  [75-113] 88 (02/20 0900) Resp:  [12-23] 15 (02/20 0900) BP: (93-140)/(28-45) 111/32 mmHg (02/20 0800) SpO2:  [95 %-100 %] 95 % (02/20 0900)   HEMODYNAMICS:         INTAKE / OUTPUT:  Intake/Output Summary (Last 24 hours) at 10/29/14 1039 Last data filed at 10/29/14 0900  Gross per 24 hour  Intake 4537.67 ml  Output   1350 ml  Net 3187.67 ml    PHYSICAL EXAMINATION: General:  Chronically ill appearing female in NAD Neuro:  Improved mentation, speech clear HEENT:  MM pink/dry, no jvd, old central line site on L neck c/d/i,  Oral thrush Cardiovascular:  s1s2 rrr, no m/r/g  Lungs:  Even/non-labored, lungs bilaterally clear  Abdomen:  Obese/soft, bsx4 active  Musculoskeletal:  No acute deformities  Skin:  Warm/dry, inguinal folds and peri area erythematous with yeast-like discharge  LABS: PULMONARY  Recent Labs Lab  10/26/14 1215 10/26/14 1623  PHART  --  7.043*  PCO2ART  --  20.9*  PO2ART  --  124.0*  HCO3 4.8* 5.4*  TCO2 5.1 5.6  O2SAT 94.4 96.2    CBC  Recent Labs Lab 10/27/14 0255 10/28/14 0604 10/29/14 0604  HGB 7.8* 8.7* 7.7*  HCT 23.3* 27.1* 22.8*  WBC 10.4 12.6* 5.5  PLT PLATELET CLUMPS NOTED ON SMEAR, COUNT APPEARS ADEQUATE 320 247    COAGULATION  Recent Labs Lab 10/26/14 2207  INR 1.18    CARDIAC   Recent Labs Lab 10/26/14 1501 10/26/14 2208 10/27/14 0255 10/27/14 1030  TROPONINI 0.07* 6.02* 12.26* 9.53*   No results for input(s): PROBNP in the last 168 hours.   CHEMISTRY  Recent Labs Lab 10/24/14 0625 10/25/14 0552  10/27/14 0255  10/28/14 0604  10/28/14 1400 10/28/14 1836 10/28/14 2200 10/29/14 0150 10/29/14 0604  NA 138 137  < > 141  < > 137  < > 139 141 142 141 140  K 3.8 3.5  < > 3.3*  < > 5.0  < > 4.1 3.5 3.7 3.4* 3.5  CL 105 103  < > 118*  < > 111  < > 116* 120* 120* 125* 120*  CO2 23 21  < > 17*  < > <5*  < > 5* 13* 13* 16* 15*  GLUCOSE 125* 312*  < > 106*  < >  614*  < > 312* 141* 85 130* 217*  BUN 17 18  < > 28*  < > 28*  < > 28* 27* 24* 26* 24*  CREATININE 2.72* 2.68*  < > 2.44*  < > 2.49*  < > 2.61* 2.48* 2.46* 2.47* 2.26*  CALCIUM 8.1* 8.2*  < > 7.0*  < > 7.6*  < > 7.3* 7.5* 7.5* 7.2* 7.2*  MG  --   --   --  1.6  --  1.7  --   --   --   --   --  1.5  PHOS 3.2 3.1  --  3.1  --  4.3  --   --   --   --   --  3.1  < > = values in this interval not displayed. Estimated Creatinine Clearance: 25.1 mL/min (by C-G formula based on Cr of 2.26).   LIVER  Recent Labs Lab 10/23/14 0413 10/24/14 0625 10/25/14 0552 10/26/14 2207 10/28/14 1400  AST  --   --   --   --  56*  ALT  --   --   --   --  27  ALKPHOS  --   --   --   --  143*  BILITOT  --   --   --   --  1.0  PROT  --   --   --   --  4.8*  ALBUMIN 2.2* 2.2* 2.4*  --  2.2*  INR  --   --   --  1.18  --      INFECTIOUS  Recent Labs Lab 10/26/14 2207 10/26/14 2208  10/27/14 0255 10/28/14 0604  LATICACIDVEN 4.1*  --  0.9  --   PROCALCITON  --  2.64 2.95 2.28     ENDOCRINE CBG (last 3)   Recent Labs  10/29/14 0306 10/29/14 0424 10/29/14 0441  GLUCAP 140* 164* 157*         IMAGING x48h No results found.      ASSESSMENT / PLAN:  ENDOCRINE A:   #Baseline   - DM1  - Hypothyroid  #Current  DKA / HONK at admit 10/26/2014   - initially suspected due to  lack of insulin administration post discharge but it appears on 10/29/14 it is her severe gastroparesis with resultant inability to take POs as a primary cause    - 10/29/14: again off insulin gtt despite bic 15 on the premise that low bic due to CRI  P:   Check abg, ketones, lactate stat - if abnormal restart insulin gtt Otherwise go with transition protocol but added bicarb tablets - ? Can she keep it down Diabetes Coordinator consult  Continue synthroid IV, 62.5 mcg   PULMONARY OETT n/a  A: No acute process.  Admit CXR clear.  P:   Aspiration precautions  Oxygen as needed to support saturations > 90% Pulmonary hygiene  CARDIOVASCULAR CVL n/a  A:  #Baseline  - Hx HTN, HLD, CAD s/p CABG, CHF - chronic systolic  #This admit  - Hypotension - resolved   - NSTEMI - no new EKG changes, LBB is new for 2016, favor demand ischemia ; heaprin gtt stopped 10/28/14    P:  Hold home coreg Continue crestor, plavix, ASA ASA Get echo  RENAL  A:  Acute on Chronic Kidney Injury - in setting of volume depletion, hypotension and DKA Anion Gap Metabolic Acidosis - in setting of DKA, no additional non-gap component    - 10/29/14 -  bic still low but insulin gtt stopped due to premise that low bic reflective of ATN component and gi loss  P:   Stat lactate, ketones, and abg  - if abnormaal restart insuling gtt Trend BMP Replace electrolytes as indicated Trend AG IVF at 125   GASTROINTESTINAL A:   GERD Gastroparesis - has been limiting factor with PO intake per patient,  affecting her home regimen of insulin. Likely cause of DKA   - 10/29/14 - this appears to be very severe and refractory. Concern this is limiting facotr in her recovery P:   Resume home reglan, PPI Carb modified diet - unabl;e to tolerate PO GI consult appreciated   HEMATOLOGIC A:   Macrocytic Anemia  P:  Consider addition of B12 orally once improved Monitor CBC  Heparin for DVT prophylaxis    INFECTIOUS A:   Inguinal / Peri Yeast Infection  SIRS - r/o infectious etiology  P:   BCx2 2/17 >>  UA 2/17 >> ++glucose, otherwise essentially neg  Vanco, start date 2/17, day 1/1 Zosyn, start date 2/17, day1/1  Diflucan IV, start date 2/17, day 3/3.  Then continue for 7-14 days with oral nystatin swish/swallow   NEUROLOGIC A:   Acute Metabolic Encephalopathy - in setting of AKI / DKA.  Resolving Depression  Deconditioning P:   Resume baseline Lexapro PT consult   FAMILY  - Updates: Husband indicated at admission concern for her returning home.  Feels she needs SNF / Rehab efforts after d/c. Patient and husband updated at bedside. 10/29/2014. Explained in front of RN that GI issues might limit her prognosis and recovery.    GLOBAL:    PCCM MD concerned that is in chronic crrticall illness phase and dKA might be ongoing difficult recurrent v peristent issue due to severe gastroparesis. IF this problem with GI tract not resolved might need to consider palliative care for goals of care. MOve to SDU. Patient will go to [facility] 10/30/14 and PCCM off   Dr. Brand Males, M.D., [facility].C.P Pulmonary and Critical Care Medicine Staff Physician [place] [facility] Pager: [phone], If no answer or between  15:00h - 7:00h: call [phone]  10/29/2014 11:05 AM

## 2014-10-29 NOTE — Progress Notes (Signed)
[place] Progress Note Patient Name: Bridget Martinez DOB: April 22, 1951 MRN: [id number]   Date of Service  10/29/2014  HPI/Events of Note  Patient on insulin drip for DKA with glucose 125.  Last 3 chemistries with normal AG.  HCO3 is 16 likely from saline resuscitation or chronic renal failure.  eICU Interventions  Stop drip 10 units lantus HS Change D5NS at 125cc/hr to D5 1/2 NS Moderate SSI q4 Patient not tolerating oral intake     Intervention Category Major Interventions: Acid-Base disturbance - evaluation and management  Mauri Brooklyn, P 10/29/2014, 3:18 AM

## 2014-10-29 NOTE — Progress Notes (Signed)
Echocardiogram 2D Echocardiogram has been performed.  Bridget Martinez 10/29/2014, 2:03 PM

## 2014-10-29 NOTE — Progress Notes (Addendum)
     [facility] Progress Note  Subjective:   Nauseous, vomited once this morning small amount fluid. No abd pain.  Passing small amounts gas.   Objective:  Vital signs in last 24 hours: Temp:  [97.9 F (36.6 C)-98.8 F (37.1 C)] 97.9 F (36.6 C) (02/20 0300) Pulse Rate:  [75-115] 90 (02/20 0600) Resp:  [12-23] 19 (02/20 0600) BP: (93-140)/(28-45) 104/30 mmHg (02/20 0400) SpO2:  [96 %-100 %] 99 % (02/20 0600) Last BM Date: 10/27/14 General:   Alert,  Well-developed,    in NAD Heart:  Regular rate and rhythm; no murmurs Pulm;lungs clear Abdomen:  Soft, nontender and nondistended. Normal bowel sounds, without guarding, and without rebound.   Extremities:  Without edema. Neurologic:  Alert and  oriented x4;  grossly normal neurologically. Psych:  Alert and cooperative. Normal mood and affect.   Lab Results:  Recent Labs  10/27/14 0255 10/28/14 0604 10/29/14 0604  WBC 10.4 12.6* 5.5  HGB 7.8* 8.7* 7.7*  HCT 23.3* 27.1* 22.8*  PLT PLATELET CLUMPS NOTED ON SMEAR, COUNT APPEARS ADEQUATE 320 247   BMET  Recent Labs  10/28/14 2200 10/29/14 0150 10/29/14 0604  NA 142 141 140  K 3.7 3.4* 3.5  CL 120* 125* 120*  CO2 13* 16* 15*  GLUCOSE 85 130* 217*  BUN 24* 26* 24*  CREATININE 2.46* 2.47* 2.26*  CALCIUM 7.5* 7.2* 7.2*      ASSESSMENT/PLAN:  64 year old female with history of type 1 diabetes admitted with presumed DKA.Poor glycemic control likely cause of her gastric dysfunction and motility disturbance.Continue reglan. Will add glycerin suppository bid to help stimulate motility.    LOS: 3 days   Hvozdovic, Vita Barley PA-C 10/29/2014, Pager [phone]   [facility] Attending  I have also seen and assessed the patient and agree with the above note. Continue to work on glucose. If not much better may add short-term erythromycin. SNF after dc a good idea Needs gastroparesis education re: diets Would use liquid metaclopramide - she ran out of this and  suspect that was part of her problem as well as poor long-term control w/ Hgb A1C 10   Would also consider concomitant depression as part of problem. She certainly looks depressed. May need addition to current SSRI or a change  Gatha Mayer, MD, [facility] [phone] (pager) 10/29/2014 11:22 AM

## 2014-10-30 DIAGNOSIS — N189 Chronic kidney disease, unspecified: Secondary | ICD-10-CM

## 2014-10-30 DIAGNOSIS — E1043 Type 1 diabetes mellitus with diabetic autonomic (poly)neuropathy: Secondary | ICD-10-CM | POA: Diagnosis present

## 2014-10-30 DIAGNOSIS — K3184 Gastroparesis: Secondary | ICD-10-CM

## 2014-10-30 DIAGNOSIS — I214 Non-ST elevation (NSTEMI) myocardial infarction: Secondary | ICD-10-CM

## 2014-10-30 LAB — BASIC METABOLIC PANEL
ANION GAP: 7 (ref 5–15)
BUN: 19 mg/dL (ref 6–23)
CO2: 20 mmol/L (ref 19–32)
CREATININE: 2.16 mg/dL — AB (ref 0.50–1.10)
Calcium: 7.5 mg/dL — ABNORMAL LOW (ref 8.4–10.5)
Chloride: 116 mmol/L — ABNORMAL HIGH (ref 96–112)
GFR, EST AFRICAN AMERICAN: 27 mL/min — AB (ref >90)
GFR, EST NON AFRICAN AMERICAN: 23 mL/min — AB (ref >90)
GLUCOSE: 125 mg/dL — AB (ref 70–99)
Potassium: 3 mmol/L — ABNORMAL LOW (ref 3.5–5.1)
SODIUM: 143 mmol/L (ref 135–145)

## 2014-10-30 LAB — CBC WITH DIFFERENTIAL/PLATELET
BASOS PCT: 0 % (ref 0–1)
Basophils Absolute: 0 10*3/uL (ref 0.0–0.1)
EOS PCT: 3 % (ref 0–5)
Eosinophils Absolute: 0.1 10*3/uL (ref 0.0–0.7)
HEMATOCRIT: 25.1 % — AB (ref 36.0–46.0)
Hemoglobin: 8.4 g/dL — ABNORMAL LOW (ref 12.0–15.0)
Lymphocytes Relative: 26 % (ref 12–46)
Lymphs Abs: 1.4 10*3/uL (ref 0.7–4.0)
MCH: 29.8 pg (ref 26.0–34.0)
MCHC: 33.5 g/dL (ref 30.0–36.0)
MCV: 89 fL (ref 78.0–100.0)
MONOS PCT: 13 % — AB (ref 3–12)
Monocytes Absolute: 0.7 10*3/uL (ref 0.1–1.0)
Neutro Abs: 3.1 10*3/uL (ref 1.7–7.7)
Neutrophils Relative %: 58 % (ref 43–77)
Platelets: 242 10*3/uL (ref 150–400)
RBC: 2.82 MIL/uL — AB (ref 3.87–5.11)
RDW: 15.3 % (ref 11.5–15.5)
WBC: 5.3 10*3/uL (ref 4.0–10.5)

## 2014-10-30 LAB — GLUCOSE, CAPILLARY
GLUCOSE-CAPILLARY: 93 mg/dL (ref 70–99)
GLUCOSE-CAPILLARY: 129 mg/dL — AB (ref 70–99)
GLUCOSE-CAPILLARY: 100 mg/dL — AB (ref 70–99)
GLUCOSE-CAPILLARY: 110 mg/dL — AB (ref 70–99)
Glucose-Capillary: 114 mg/dL — ABNORMAL HIGH (ref 70–99)

## 2014-10-30 LAB — TROPONIN I: Troponin I: 2.58 ng/mL (ref <0.031)

## 2014-10-30 LAB — MAGNESIUM: Magnesium: 1.8 mg/dL (ref 1.5–2.5)

## 2014-10-30 LAB — PHOSPHORUS: Phosphorus: 2.4 mg/dL (ref 2.3–4.6)

## 2014-10-30 LAB — LACTIC ACID, PLASMA: Lactic Acid, Venous: 2.4 mmol/L (ref 0.5–2.0)

## 2014-10-30 MED ORDER — CHLORHEXIDINE GLUCONATE 0.12 % MT SOLN: 15.0000 mL | Freq: Two times a day (BID) | OROMUCOSAL | Status: DC

## 2014-10-30 MED ORDER — FUROSEMIDE 10 MG/ML IJ SOLN
4.0000 mg/h | INTRAVENOUS | Status: DC
  Administered 2014-10-30: 4 mg/h via INTRAVENOUS
  Filled 2014-10-30: qty 25

## 2014-10-30 MED ORDER — ALBUMIN HUMAN 25 % IV SOLN
25.0000 g | Freq: Four times a day (QID) | INTRAVENOUS | Status: AC
  Administered 2014-10-30 – 2014-10-31 (×4): 25 g via INTRAVENOUS
  Filled 2014-10-30: qty 50
  Filled 2014-10-30 (×3): qty 100
  Filled 2014-10-30: qty 50

## 2014-10-30 MED ORDER — DEXTROSE 5 % IV SOLN: INTRAVENOUS | Status: DC

## 2014-10-30 MED ORDER — HEPARIN (PORCINE) IN NACL 100-0.45 UNIT/ML-% IJ SOLN
800.0000 [IU]/h | INTRAMUSCULAR | Status: DC
  Administered 2014-10-30: 800 [IU]/h via INTRAVENOUS
  Filled 2014-10-30: qty 250

## 2014-10-30 MED ORDER — CETYLPYRIDINIUM CHLORIDE 0.05 % MT LIQD: 7.0000 mL | Freq: Two times a day (BID) | OROMUCOSAL | Status: DC

## 2014-10-30 MED ORDER — SODIUM CHLORIDE 0.9 % IV SOLN
INTRAVENOUS | Status: DC
  Filled 2014-10-30: qty 2.5

## 2014-10-30 MED ORDER — DEXTROSE 5 % IV SOLN
INTRAVENOUS | Status: DC
  Administered 2014-10-30: 09:00:00 via INTRAVENOUS
  Filled 2014-10-30: qty 1000

## 2014-10-30 MED ORDER — METOCLOPRAMIDE HCL 5 MG/ML IJ SOLN
5.0000 mg | Freq: Four times a day (QID) | INTRAMUSCULAR | Status: DC
  Administered 2014-10-30 – 2014-10-31 (×3): 5 mg via INTRAVENOUS
  Filled 2014-10-30 (×3): qty 2

## 2014-10-30 MED ORDER — ALBUMIN HUMAN 25 % IV SOLN
25.0000 g | Freq: Four times a day (QID) | INTRAVENOUS | Status: DC
Start: 2014-10-30 — End: 2014-10-30
  Filled 2014-10-30: qty 50

## 2014-10-30 MED ORDER — FUROSEMIDE 10 MG/ML IJ SOLN: 60.0000 mg | Freq: Two times a day (BID) | INTRAMUSCULAR | Status: DC

## 2014-10-30 NOTE — Consult Note (Signed)
CARDIOLOGY CONSULT NOTE   Patient ID: Bridget Martinez MRN: [id number], DOB/AGE: [birth year]   Admit date: 10/26/2014 Date of Consult: 10/30/2014   Primary Physician: [name], FNP Primary Cardiologist: Dr. Haroldine Laws  Pt. Profile  64 year old woman with known ischemic heart disease status post CABG and severe diabetes mellitus.  She was discharged from [facility] on 10/25/14 and was readmitted to [facility] on 10/26/14 with diabetic ketoacidosis.  She is followed in the heart failure clinic.  Problem List  Past Medical History  Diagnosis Date  . Diabetes mellitus     on insulin, with h/o DKA   . HTN (hypertension)   . Hyperlipidemia   . Venous stasis ulcers   . Arthritis   . Depression     anxiety  . 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 Hypertension   . Coronary artery disease     angina.  MI.   . Pneumonia        . GERD (gastroesophageal reflux disease)     Hiatal hernia.   . Hypothyroidism   . CHF (congestive heart failure)   . Tuberculosis   . Osteoporosis     Past Surgical History  Procedure Laterality Date  . Cardiac catheterization    . Tonsillectomy    . Coronary artery bypass graft      Dr. Prescott Gum in 09/2011   . Coronary artery bypass graft  2012  . Esophagogastroduodenoscopy N/A 11/17/2013    Procedure: ESOPHAGOGASTRODUODENOSCOPY (EGD);  Surgeon: Lafayette Dragon, MD;  Location: [facility] ENDOSCOPY;  Service: Endoscopy;  Laterality: N/A;  . Colonoscopy N/A 11/18/2013    Procedure: COLONOSCOPY;  Surgeon: Lafayette Dragon, MD;  Location: [facility] ENDOSCOPY;  Service: Endoscopy;  Laterality: N/A;  . Right heart catheterization N/A 07/29/2011    Procedure: RIGHT HEART CATH;  Surgeon: Minus Breeding, MD;  Location: [facility] CATH LAB;  Service: Cardiovascular;  Laterality: N/A;  . Left heart catheterization with coronary angiogram N/A 07/26/2011    Procedure: LEFT HEART CATHETERIZATION WITH CORONARY ANGIOGRAM;  Surgeon:  Larey Dresser, MD;  Location: [facility] CATH LAB;  Service: Cardiovascular;  Laterality: N/A;     Allergies  Allergies  Allergen Reactions  . Sulfa Antibiotics Anaphylaxis and Swelling    Stiff neck also     HPI   This 64 year old woman has been in chronically poor health.  She is a severe diabetic.  She has left ventricular systolic dysfunction and is followed in the heart failure clinic by Dr. Haroldine Laws.  She had CABG in January 2013 by Dr. Prescott Gum. She has had significant weight loss over the past 6-12 months because of inability to keep food down.  She vomits multiple times a day.  She has been diagnosed with diabetic gastroparesis and has been on Reglan at home. Recently her blood sugar control has been erratic and she has had a total of 9 admissions to the hospital according to the family over the past year.  Last week she was hospitalized for poorly controlled diabetes.  She was discharged on the afternoon of February 16 and the following morning her blood sugars were greater than 600 and she was readmitted.  On admission she had sinus tachycardia and left bundle branch block pattern.  She denied any chest pain.  However her cardiac enzymes were significantly elevated with peak troponin of 9.  Her echocardiogram done on 10/29/14 shows inferior wall akinesis and overall left ventricular ejection fraction of  30-35%.  She was last seen in the heart failure clinic in October 2015.  She has been on dual antiplatelet therapy at home.  However because of her repeated problems with nausea and vomiting, the amount of drug that she retains is uncertain.  Inpatient Medications  . [START ON 10/31/2014] antiseptic oral rinse  7 mL Mouth Rinse q12n4p  . aspirin  325 mg Oral Daily  . [START ON 10/31/2014] chlorhexidine  15 mL Mouth Rinse BID  . clopidogrel  75 mg Oral Q breakfast  . escitalopram  10 mg Oral Daily  . feeding supplement (GLUCERNA SHAKE)  237 mL Oral TID BM  . Glycerin (Adult)  1 suppository  Rectal BID  . heparin subcutaneous  5,000 Units Subcutaneous 3 times per day  . levothyroxine  62.5 mcg Intravenous Daily  . metoCLOPramide (REGLAN) injection  10 mg Intravenous 4 times per day  . nystatin  5 mL Oral QID  . nystatin   Topical BID  . pantoprazole (PROTONIX) IV  40 mg Intravenous Daily  . rosuvastatin  40 mg Oral Daily    Family History Family History  Problem Relation Age of Onset  . Heart disease Father   . Multiple sclerosis Father   . Hypertension Mother   . Hyperthyroidism Mother   . Diabetes Cousin     Multiple maternal cousins with type 2 diabetes mellitus  . Diabetes Maternal Uncle     Type 1 diabetes mellitus  . Heart attack Paternal Grandfather   . Heart disease Paternal Grandfather      Social History History   Social History  . Marital Status: Married    Spouse Name: Grayland Ormond [phone]; Son Rush Landmark  . Number of Children: 2  . Years of Education: N/A   Occupational History  . Not on file.   Social History Main Topics  . Smoking status: Never Smoker   . Smokeless tobacco: Not on file  . Alcohol Use: No  . Drug Use: No  . Sexual Activity: Yes    Birth Control/ Protection: Post-menopausal   Other Topics Concern  . Not on file   Social History Narrative   ** Merged History Encounter **       Zaeleigh Bok (Husband): [phone] [facility]) [phone] (Cell)     Review of Systems  General:  No chills, fever, night sweats or weight changes.  Cardiovascular:  No chest pain, dyspnea on exertion, edema, orthopnea, palpitations, paroxysmal nocturnal dyspnea. Dermatological: No rash, lesions/masses Respiratory: No cough, dyspnea Urologic: No hematuria, dysuria Abdominal:  Positive for gastroparesis nausea and vomiting Neurologic:  No visual changes, wkns, changes in mental status. All other systems reviewed and are otherwise negative except as noted above.  Physical Exam  Blood pressure 117/41, pulse 69, temperature 98 F (36.7 C), temperature  source Oral, resp. rate 12, height 5\' 4"  (1.626 m), weight 167 lb 12.3 oz (76.1 kg), SpO2 98 %.  General: Pleasant, NAD.  She appears pale and chronically ill Psych: Normal affect. Neuro: Alert and oriented X 3. Moves all extremities spontaneously. HEENT: Normal  Neck: Supple without bruits or JVD. Lungs:  Mild inspiratory rales at bases Heart: RRR no s3, s4, or murmurs. Abdomen: Soft, non-tender, non-distended, BS + x 4.  Extremities: Significant edema worse in the upper extremities  Labs   Recent Labs  10/30/14 0657  TROPONINI 2.58*   Lab Results  Component Value Date   WBC 5.3 10/30/2014   HGB 8.4* 10/30/2014   HCT 25.1* 10/30/2014   MCV  89.0 10/30/2014   PLT 242 10/30/2014    Recent Labs Lab 10/28/14 1400  10/30/14 0840  NA 139  < > 143  K 4.1  < > 3.0*  CL 116*  < > 116*  CO2 5*  < > 20  BUN 28*  < > 19  CREATININE 2.61*  < > 2.16*  CALCIUM 7.3*  < > 7.5*  PROT 4.8*  --   --   BILITOT 1.0  --   --   ALKPHOS 143*  --   --   ALT 27  --   --   AST 56*  --   --   GLUCOSE 312*  < > 125*  < > = values in this interval not displayed. Lab Results  Component Value Date   CHOL 131 07/26/2011   HDL 68 09/27/2013   LDLCALC 172* 09/27/2013   TRIG 145 09/27/2013   No results found for: DDIMER  Radiology/Studies  US Renal  10/22/2014   CLINICAL DATA:  Acute renal failure.  EXAM: RENAL/URINARY TRACT ULTRASOUND COMPLETE  COMPARISON:  CT of the abdomen and pelvis on 07/30/2014 and ultrasound 05/23/2012  FINDINGS: Right Kidney:  Length: 12.8 cm. Small upper pole cyst is 0.9 x 0.8 x 1.0 cm. No hydronephrosis. Normal renal echogenicity.  Left Kidney:  Length: 11.6 cm. Echogenicity within normal limits. No mass or hydronephrosis visualized.  Bladder:  The bladder is partially decompressed by a Foley catheter.  IMPRESSION: 1. No hydronephrosis. 2. Stable right renal cyst.   Electronically Signed   By: Nolon Nations M.D.   On: 10/22/2014 10:42   Portable Chest X-ray (1  View)  10/26/2014   CLINICAL DATA:  Diabetic ketoacidosis.  Hypertension  EXAM: PORTABLE CHEST - 1 VIEW  COMPARISON:  October 21, 2014.  FINDINGS: Lungs are clear. Heart is mildly enlarged with pulmonary vascularity within normal limits. Patient is status post coronary artery bypass grafting. No adenopathy. No bone lesions.  IMPRESSION: Heart mildly enlarged.  No lung edema or consolidation.   Electronically Signed   By: Lowella Grip III M.D.   On: 10/26/2014 15:21   Dg Chest Port 1 View  10/21/2014   CLINICAL DATA:  Shortness of breath.  EXAM: PORTABLE CHEST - 1 VIEW  COMPARISON:  September 18, 2014.  FINDINGS: The heart size and mediastinal contours are within normal limits. No pneumothorax or pleural effusion is noted. Status post Coronary artery bypass graft. No acute pulmonary disease is noted. The visualized skeletal structures are unremarkable.  IMPRESSION: No acute cardiopulmonary abnormality seen.   Electronically Signed   By: Marijo Conception, M.D.   On: 10/21/2014 08:55   Dg Abd Portable 2v  10/21/2014   CLINICAL DATA:  Vomiting.  EXAM: PORTABLE ABDOMEN - 2 VIEW  COMPARISON:  July 27, 2014.  FINDINGS: The bowel gas pattern is normal. There is no evidence of free air. No radio-opaque calculi or other significant radiographic abnormality is seen. Stool is noted in the rectum.  IMPRESSION: No evidence of bowel obstruction or ileus.   Electronically Signed   By: Marijo Conception, M.D.   On: 10/21/2014 08:52    ECG On 10/27/14: Normal sinus rhythm Inferior infarct , age undetermined Cannot rule out Anterior infarct , age undetermined Abnormal ECG When compared to prior, LBBB is no longer Confirmed by Marlou Porch MD, [place] ([id number]) on 10/27/2014 1:16:32 PM Personally reviewed. Two-dimensional echocardiogram: On 10/2014 Study Conclusions  - Left ventricle: The cavity size was moderately dilated. Systolic function  was moderately to severely reduced. The estimated ejection fraction was in  the range of 30% to 35%. There is akinesis of the entireinferior myocardium. Features are consistent with a pseudonormal left ventricular filling pattern, with concomitant abnormal relaxation and increased filling pressure (grade 2 diastolic dysfunction). - Aortic valve: Trileaflet; normal thickness, mildly calcified leaflets. There was mild regurgitation. Valve area (Vmax): 2.12 cm^2. - Mitral valve: There was mild to moderate regurgitation. - Tricuspid valve: There was mild-moderate regurgitation. - Pulmonic valve: There was mild regurgitation. - Pulmonary arteries: PA peak pressure: 45 mm Hg (S).  Impressions:  - The right ventricular systolic pressure was increased consistent with moderate pulmonary hypertension. ASSESSMENT AND PLAN 1.  Non-STEMI with new inferior wall akinesis since previous echocardiogram in 2015.  She did not have any chest pain associated with this event 2.  Ischemic heart disease status post CABG in 2013 3.  Severe diabetic ketoacidosis 4.  Chronic nausea and vomiting secondary to diabetic gastroparesis 5.  Acute renal insufficiency secondary to severe diabetic ketoacidosis 6.  Transient left bundle branch block, resolved  Disposition: Continue with dual antiplatelet therapy.  Continue with short-term IV heparin. Consider possible cardiac catheterization next week if renal function improves and if her overall medical condition and diabetic control improves.  Her main problem at this point according to family has been her inability to eat and her widely fluctuating blood sugars.  Continue diuresis.  Watch renal function closely.  Although she has massive fluid overload in the upper extremities, her chest x-ray and her physical examination of her lungs do not suggest severe volume overload. Signed, Darlin Coco, MD  10/30/2014, 11:29 AM

## 2014-10-30 NOTE — Progress Notes (Signed)
   Patient Name: Bridget Martinez Date of Encounter: 10/30/2014, 3:15 PM    Subjective  Feels better No vomiting tol po   Objective  BP 138/37 mmHg  Pulse 79  Temp(Src) 97.9 F (36.6 C) (Oral)  Resp 19  Ht 5\' 4"  (1.626 m)  Wt 167 lb 12.3 oz (76.1 kg)  BMI 28.78 kg/m2  SpO2 98%     Assessment and Plan  1) Gastroparesis 2) Diabetes type 1 out of control   Improved Continue metaclopramide Gastroparesis education is needed Convert to liquid metaclopramide when we change from IV (? Tomorrow) Not clear why dose decreased to 5 mg but would use 10 mg unless there are observed side effects at that level   Gatha Mayer, MD, [facility] [phone] (pager) 10/30/2014 3:15 PM

## 2014-10-30 NOTE — Progress Notes (Signed)
[place] for Heparin Indication: NSTEMI  Allergies  Allergen Reactions  . Sulfa Antibiotics Anaphylaxis and Swelling    Stiff neck also     Patient Measurements: Height: 5\' 4"  (162.6 cm) Weight: 167 lb 12.3 oz (76.1 kg) IBW/kg (Calculated) : 54.7 Heparin Dosing Weight: 70.7 kg  Vital Signs: Temp: 97.9 F (36.6 C) (02/21 1326) Temp Source: Oral (02/21 1326) BP: 138/37 mmHg (02/21 1400) Pulse Rate: 79 (02/21 1400)  Labs:  Recent Labs  10/28/14 0040  10/28/14 0604  10/29/14 0604 10/29/14 1247 10/29/14 1455 10/30/14 0657 10/30/14 0840  HGB  --   < > 8.7*  --  7.7*  --   --  8.4*  --   HCT  --   --  27.1*  --  22.8*  --   --  25.1*  --   PLT  --   --  320  --  247  --   --  242  --   HEPARINUNFRC 0.47  --   --   --   --   --   --   --   --   CREATININE  --   --  2.49*  < > 2.26* 2.36* 2.32*  --  2.16*  TROPONINI  --   --   --   --   --   --   --  2.58*  --   < > = values in this interval not displayed.  Estimated Creatinine Clearance: 26.3 mL/min (by C-G formula based on Cr of 2.16).   Medical History: Past Medical History  Diagnosis Date  . Diabetes mellitus     on insulin, with h/o DKA   . HTN (hypertension)   . Hyperlipidemia   . Venous stasis ulcers   . Arthritis   . Depression     anxiety  . 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 Hypertension   . Coronary artery disease     angina.  MI.   . Pneumonia        . GERD (gastroesophageal reflux disease)     Hiatal hernia.   . Hypothyroidism   . CHF (congestive heart failure)   . Tuberculosis   . Osteoporosis     Assessment: 72 yoF re -admitted to [facility] on 2/17 from home for DKA. She was recently discharged from [facility] on 2/16 but returned when home health RN had "high" CBG readings. She was also noted to have increasing troponin, without chest pain. Heparin drip was initiated 2/18, then discontinued on 2/19. Echo done on  2/20 shows inferior wall akinesis and overall left ventricular ejection fraction of 30-35%. Troponin this AM 2.58. Pharmacy is consulted to restart heparin infusion for NSTEMI with new inferior wall akinesis.  Today, 3:33 PM:  Heparin 5000 units SQ given at 1322  SCr elevated at 2.16, CrCl ~ 26 mL/min  CBC: Hgb 8.4, low but relatively stable. Pltc WNL.  Troponins 0.07 > 6.02 > 12.26 > 9.53 > 2.58  No bleeding issues reported.  Goal of Therapy:  Heparin level 0.3-0.7 units/ml Monitor platelets by anticoagulation protocol: Yes   Plan:   D/C SQ heparin.  No bolus, as received SQ heparin couple hours ago.  Start heparin infusion at 800 units/hr, as anti-Xa level was therapeutic previously at this rate.  Check anti-Xa level in 8 hours and daily while on heparin   Daily CBC.  Monitor for bleeding issues.  Lindell Spar, PharmD, BCPS Pager: [phone] 10/30/2014 3:42 PM

## 2014-10-30 NOTE — Progress Notes (Signed)
CRITICAL VALUE ALERT  Critical value received:  Lactic acid 2.4  Date of notification:  10/30/2014  Time of notification:  0747  Critical value read back:No.  Nurse who received alert:  Reche Dixon   MD notified (1st page):  Feliz-ortiz  Time of first page:  0750  MD notified (2nd page):  Time of second page:  Responding MD:  Aileen Fass  Time MD responded:  [phone]

## 2014-10-30 NOTE — Progress Notes (Signed)
[facility] PROGRESS NOTE  Assessment/Plan: DKA (diabetic ketoacidoses)Metabolic acidosis: - She still acidotic per basic metabolic panel on the [id number], her bicarbonate 16, Anion gap is less than 12. Currently her DKA has resolved. - She is massively fluid overloaded, her albumin is 2.2, her UA was negative for proteins. - Question if she her acidosis is due to poor kidney perfusion  Acute on Chronic systolic and diastolic heart failure/Cardiorenal syndrome: - She is massively fluid overloaded, her albumin is 2.2 which is probably contributing to her overload. She still acidotic with an elevated lactate likely from a poor perfusion state. - She is +12 L since admission, her weight increased from 65 kg to 76 kg. - I will go ahead and start her on IV Lasix infusion monitor strict I's and O's. - She also had elevated enzymes, on admission her cardiac enzymes were 6, and went up to 9, she was started on heparin drip now has been DC'd. Check 2-D echo was done that shows new akinesia of the inferior wall. - I will go ahead and start her on IV heparin will discuss with cardiology. Continue Plavix.  Non-ST elevation MI: - She was started on heparin on admission it is been DC'd, her echo showed complete akinesia of the inferior wall she has a new left bundle-branch block on EKG compared to her previous EKG from 09/16/2014. - Diureti consulted cardiology we'll continue her on Plavix.   Acute renal failure: - Baseline creatinine on 10/08/2014 was 1.0 - Probably due to cardiorenal syndrome - Chloride is increasing as well as her sodium. - I will KVO her IV fluids, started on IV diuretics.  Diabetic gastroparesis: - Appreciate GIs assistance continue small frequent meals.  Protein-calorie malnutrition, severe - Small frequent meals. - Her albumin is 2.2 we'll get nutrition consult.  Encephalopathy acute - Most likely due to the metabolic derangement has resolved.  Hyperkalemia: -  This was most likely due to acidosis, was now resolved.     Code Status: full Family Communication: none  Disposition Plan: inpatient   Consultants:  Gastroenterology  cardiology  Procedures:  Echo  Antibiotics:  None  HPI/Subjective: She relates she feels tired, but slightly better than when she came in.  Objective: Filed Vitals:   10/30/14 0000 10/30/14 0200 10/30/14 0400 10/30/14 0600  BP: 122/49 99/41 124/51 143/58  Pulse: 81 73 89 94  Temp:   98.1 F (36.7 C)   TempSrc:   Oral   Resp: 13 12 17 19   Height:      Weight:      SpO2: 98% 96% 97% 97%    Intake/Output Summary (Last 24 hours) at 10/30/14 0755 Last data filed at 10/30/14 0600  Gross per 24 hour  Intake   2680 ml  Output   2500 ml  Net    180 ml   Filed Weights   10/26/14 1613 10/28/14 0500  Weight: 70.5 kg (155 lb 6.8 oz) 76.1 kg (167 lb 12.3 oz)    Exam:  General: Alert, awake, oriented x3, in no acute distress.  HEENT: No bruits, no goiter. +JVD Heart: Regular rate and rhythm. Lungs: Good air movement,crackles bilaterally at bases.  Abdomen: Soft, nontender, nondistended, positive bowel sounds.  Neuro: Grossly intact, nonfocal.   Data Reviewed: Basic Metabolic Panel:  Recent Labs Lab 10/25/14 0552  10/27/14 0255  10/28/14 0604  10/28/14 2200 10/29/14 0150 10/29/14 0604 10/29/14 1247 10/29/14 1455 10/30/14 0657  NA 137  < > 141  < >  137  < > 142 141 140 137 144  --   K 3.5  < > 3.3*  < > 5.0  < > 3.7 3.4* 3.5 3.0* 4.6  --   CL 103  < > 118*  < > 111  < > 120* 125* 120* 115* 119*  --   CO2 21  < > 17*  < > <5*  < > 13* 16* 15* 13* 16*  --   GLUCOSE 312*  < > 106*  < > 614*  < > 85 130* 217* 475* 202*  --   BUN 18  < > 28*  < > 28*  < > 24* 26* 24* 22 22  --   CREATININE 2.68*  < > 2.44*  < > 2.49*  < > 2.46* 2.47* 2.26* 2.36* 2.32*  --   CALCIUM 8.2*  < > 7.0*  < > 7.6*  < > 7.5* 7.2* 7.2* 7.0* 7.5*  --   MG  --   --  1.6  --  1.7  --   --   --  1.5  --   --  1.8  PHOS  3.1  --  3.1  --  4.3  --   --   --  3.1  --   --  2.4  < > = values in this interval not displayed. Liver Function Tests:  Recent Labs Lab 10/24/14 0625 10/25/14 0552 10/28/14 1400  AST  --   --  56*  ALT  --   --  27  ALKPHOS  --   --  143*  BILITOT  --   --  1.0  PROT  --   --  4.8*  ALBUMIN 2.2* 2.4* 2.2*    Recent Labs Lab 10/26/14 2207 10/28/14 1400  LIPASE 554* 117*  AMYLASE 335* 85   No results for input(s): AMMONIA in the last 168 hours. CBC:  Recent Labs Lab 10/26/14 1207 10/27/14 0255 10/28/14 0604 10/29/14 0604 10/30/14 0657  WBC 8.7 10.4 12.6* 5.5 5.3  NEUTROABS 7.7 7.8* 11.3* 3.7 3.1  HGB 9.8* 7.8* 8.7* 7.7* 8.4*  HCT 33.1* 23.3* 27.1* 22.8* 25.1*  MCV 102.2* 89.3 93.1 90.1 89.0  PLT 358 PLATELET CLUMPS NOTED ON SMEAR, COUNT APPEARS ADEQUATE 320 247 242   Cardiac Enzymes:  Recent Labs Lab 10/26/14 1501 10/26/14 2208 10/27/14 0255 10/27/14 1030 10/30/14 0657  TROPONINI 0.07* 6.02* 12.26* 9.53* 2.58*   BNP (last 3 results)  Recent Labs  09/19/14 1126  BNP 298.2*    ProBNP (last 3 results)  Recent Labs  11/14/13 0405 06/15/14 1035 07/26/14 1800  PROBNP 1470.0* 650.1* 2156.0*    CBG:  Recent Labs Lab 10/29/14 1236 10/29/14 1615 10/29/14 1950 10/29/14 2334 10/30/14 0348  GLUCAP 153* 183* 172* 128* 93    Recent Results (from the past 240 hour(s))  MRSA PCR Screening     Status: None   Collection Time: 10/20/14 11:26 PM  Result Value Ref Range Status   MRSA by PCR NEGATIVE NEGATIVE Final    Comment:        The GeneXpert MRSA Assay (FDA approved for NASAL specimens only), is one component of a comprehensive MRSA colonization surveillance program. It is not intended to diagnose MRSA infection nor to guide or monitor treatment for MRSA infections.   Culture, blood (routine x 2)     Status: None   Collection Time: 10/21/14 12:50 AM  Result Value Ref Range Status   Specimen Description BLOOD LEFT  ANTECUBITAL  Final    Special Requests BOTTLES DRAWN AEROBIC AND ANAEROBIC 3CC  Final   Culture   Final    NO GROWTH 5 DAYS Performed at [facility]    Report Status 10/27/2014 FINAL  Final  Culture, blood (routine x 2)     Status: None   Collection Time: 10/21/14 12:55 AM  Result Value Ref Range Status   Specimen Description BLOOD LEFT HAND  Final   Special Requests BOTTLES DRAWN AEROBIC AND ANAEROBIC 5CC  Final   Culture   Final    NO GROWTH 5 DAYS Performed at [facility]    Report Status 10/27/2014 FINAL  Final  Urine culture     Status: None   Collection Time: 10/22/14 12:20 PM  Result Value Ref Range Status   Specimen Description URINE, CATHETERIZED  Final   Special Requests NONE  Final   Colony Count NO GROWTH Performed at [facility]   Final   Culture NO GROWTH Performed at [facility]   Final   Report Status 10/23/2014 FINAL  Final  Blood culture (routine x 2)     Status: None (Preliminary result)   Collection Time: 10/26/14 12:34 PM  Result Value Ref Range Status   Specimen Description BLOOD RIGHT UPPER ARM  Final   Special Requests BOTTLES DRAWN AEROBIC AND ANAEROBIC 5CC  Final   Culture   Final           BLOOD CULTURE RECEIVED NO GROWTH TO DATE CULTURE WILL BE HELD FOR 5 DAYS BEFORE ISSUING A FINAL NEGATIVE REPORT Performed at [facility]    Report Status PENDING  Incomplete  Blood culture (routine x 2)     Status: None (Preliminary result)   Collection Time: 10/26/14 12:47 PM  Result Value Ref Range Status   Specimen Description BLOOD RIGHT AC  Final   Special Requests BOTTLES DRAWN AEROBIC AND ANAEROBIC 5ML  Final   Culture   Final           BLOOD CULTURE RECEIVED NO GROWTH TO DATE CULTURE WILL BE HELD FOR 5 DAYS BEFORE ISSUING A FINAL NEGATIVE REPORT Performed at [facility]    Report Status PENDING  Incomplete  MRSA PCR Screening     Status: None   Collection Time: 10/26/14  4:27 PM  Result Value Ref Range Status    MRSA by PCR NEGATIVE NEGATIVE Final    Comment:        The GeneXpert MRSA Assay (FDA approved for NASAL specimens only), is one component of a comprehensive MRSA colonization surveillance program. It is not intended to diagnose MRSA infection nor to guide or monitor treatment for MRSA infections.      Studies: No results found.  Scheduled Meds: . antiseptic oral rinse  7 mL Mouth Rinse BID  . aspirin  325 mg Oral Daily  . clopidogrel  75 mg Oral Q breakfast  . escitalopram  10 mg Oral Daily  . feeding supplement (GLUCERNA SHAKE)  237 mL Oral TID BM  . Glycerin (Adult)  1 suppository Rectal BID  . heparin subcutaneous  5,000 Units Subcutaneous 3 times per day  . insulin aspart  0-15 Units Subcutaneous 6 times per day  . insulin glargine  10 Units Subcutaneous QHS  . levothyroxine  62.5 mcg Intravenous Daily  . metoCLOPramide (REGLAN) injection  10 mg Intravenous 4 times per day  . nystatin  5 mL Oral QID  . nystatin   Topical  BID  . pantoprazole (PROTONIX) IV  40 mg Intravenous Daily  . rosuvastatin  40 mg Oral Daily  . sodium chloride  10-40 mL Intracatheter Q12H   Continuous Infusions: . sodium chloride 10 mL/hr (10/29/14 1745)  . insulin (NOVOLIN-R) infusion Stopped (10/29/14 0211)  .  sodium bicarbonate  infusion 1000 mL 125 mL/hr at 10/30/14 0145     Charlynne Cousins  [facility] Pager [phone]. If 7PM-7AM, please contact night-coverage at [email or web address], password [facility] 10/30/2014, 7:55 AM  LOS: 4 days

## 2014-10-30 NOTE — Clinical Social Work Note (Signed)
CSW met with pt and her family at bedside to discuss SNF bed options  CSW provided bed offers and insurance information that was obtained from [place] facility to pt and family  Pt and her family want pt to go to [facility] SNF at discharge and are aware that her insurance will only pay for 60 % of her care there  CSW let [facility] know that pt has chosen them as her SNF choice  CSW will continue to follow up with pt needs until discharge  .Dede Query, LCSW [facility] Clinical Social Worker - Weekend Coverage cell #: [phone]

## 2014-10-31 DIAGNOSIS — I214 Non-ST elevation (NSTEMI) myocardial infarction: Secondary | ICD-10-CM | POA: Diagnosis present

## 2014-10-31 DIAGNOSIS — E1041 Type 1 diabetes mellitus with diabetic mononeuropathy: Secondary | ICD-10-CM

## 2014-10-31 DIAGNOSIS — I252 Old myocardial infarction: Secondary | ICD-10-CM | POA: Diagnosis present

## 2014-10-31 LAB — BASIC METABOLIC PANEL
Anion gap: 8 (ref 5–15)
Anion gap: 14 (ref 5–15)
BUN: 17 mg/dL (ref 6–23)
BUN: 18 mg/dL (ref 6–23)
CHLORIDE: 111 mmol/L (ref 96–112)
CHLORIDE: 102 mmol/L (ref 96–112)
CO2: 27 mmol/L (ref 19–32)
CO2: 24 mmol/L (ref 19–32)
CREATININE: 2.16 mg/dL — AB (ref 0.50–1.10)
Calcium: 8.1 mg/dL — ABNORMAL LOW (ref 8.4–10.5)
Calcium: 8.2 mg/dL — ABNORMAL LOW (ref 8.4–10.5)
Creatinine, Ser: 2.01 mg/dL — ABNORMAL HIGH (ref 0.50–1.10)
GFR calc Af Amer: 29 mL/min — ABNORMAL LOW (ref >90)
GFR calc Af Amer: 27 mL/min — ABNORMAL LOW (ref >90)
GFR calc non Af Amer: 25 mL/min — ABNORMAL LOW (ref >90)
GFR calc non Af Amer: 23 mL/min — ABNORMAL LOW (ref >90)
GLUCOSE: 213 mg/dL — AB (ref 70–99)
Glucose, Bld: 144 mg/dL — ABNORMAL HIGH (ref 70–99)
Potassium: 2.6 mmol/L — CL (ref 3.5–5.1)
Potassium: 3.1 mmol/L — ABNORMAL LOW (ref 3.5–5.1)
SODIUM: 146 mmol/L — AB (ref 135–145)
SODIUM: 140 mmol/L (ref 135–145)

## 2014-10-31 LAB — MAGNESIUM: MAGNESIUM: 2 mg/dL (ref 1.5–2.5)

## 2014-10-31 LAB — GLUCOSE, CAPILLARY
GLUCOSE-CAPILLARY: 151 mg/dL — AB (ref 70–99)
GLUCOSE-CAPILLARY: 197 mg/dL — AB (ref 70–99)
GLUCOSE-CAPILLARY: 309 mg/dL — AB (ref 70–99)
Glucose-Capillary: 123 mg/dL — ABNORMAL HIGH (ref 70–99)
Glucose-Capillary: 132 mg/dL — ABNORMAL HIGH (ref 70–99)
Glucose-Capillary: 133 mg/dL — ABNORMAL HIGH (ref 70–99)
Glucose-Capillary: 250 mg/dL — ABNORMAL HIGH (ref 70–99)
Glucose-Capillary: 440 mg/dL — ABNORMAL HIGH (ref 70–99)
Glucose-Capillary: 502 mg/dL — ABNORMAL HIGH (ref 70–99)

## 2014-10-31 LAB — PHOSPHORUS: PHOSPHORUS: 2.4 mg/dL (ref 2.3–4.6)

## 2014-10-31 LAB — HEPARIN LEVEL (UNFRACTIONATED)
Heparin Unfractionated: 0.38 IU/mL (ref 0.30–0.70)
Heparin Unfractionated: 0.71 IU/mL — ABNORMAL HIGH (ref 0.30–0.70)

## 2014-10-31 MED ORDER — POTASSIUM CHLORIDE CRYS ER 20 MEQ PO TBCR
40.0000 meq | EXTENDED_RELEASE_TABLET | Freq: Three times a day (TID) | ORAL | Status: DC
  Administered 2014-10-31 – 2014-11-01 (×5): 40 meq via ORAL
  Filled 2014-10-31 (×5): qty 2

## 2014-10-31 MED ORDER — INSULIN ASPART 100 UNIT/ML ~~LOC~~ SOLN
3.0000 [IU] | Freq: Three times a day (TID) | SUBCUTANEOUS | Status: DC
  Administered 2014-10-31 – 2014-11-05 (×10): 3 [IU] via SUBCUTANEOUS

## 2014-10-31 MED ORDER — LEVOTHYROXINE SODIUM 125 MCG PO TABS
125.0000 ug | ORAL_TABLET | Freq: Every day | ORAL | Status: DC
  Administered 2014-11-01 – 2014-11-05 (×5): 125 ug via ORAL
  Filled 2014-10-31 (×8): qty 1

## 2014-10-31 MED ORDER — CARVEDILOL 3.125 MG PO TABS
3.1250 mg | ORAL_TABLET | Freq: Two times a day (BID) | ORAL | Status: DC
  Administered 2014-10-31 – 2014-11-05 (×11): 3.125 mg via ORAL
  Filled 2014-10-31 (×11): qty 1

## 2014-10-31 MED ORDER — INSULIN DETEMIR 100 UNIT/ML ~~LOC~~ SOLN: 20.0000 [IU] | Freq: Every day | SUBCUTANEOUS | Status: DC

## 2014-10-31 MED ORDER — METOCLOPRAMIDE HCL 5 MG/5ML PO SOLN
5.0000 mg | Freq: Three times a day (TID) | ORAL | Status: DC
  Administered 2014-10-31 – 2014-11-05 (×21): 5 mg via ORAL
  Filled 2014-10-31 (×25): qty 5

## 2014-10-31 MED ORDER — MAGNESIUM SULFATE 2 GM/50ML IV SOLN: 2.0000 g | Freq: Once | INTRAVENOUS | Status: DC

## 2014-10-31 MED ORDER — POTASSIUM CHLORIDE 10 MEQ/100ML IV SOLN
10.0000 meq | INTRAVENOUS | Status: AC
  Administered 2014-10-31 (×3): 10 meq via INTRAVENOUS
  Filled 2014-10-31: qty 100

## 2014-10-31 MED ORDER — INSULIN DETEMIR 100 UNIT/ML ~~LOC~~ SOLN
20.0000 [IU] | Freq: Every day | SUBCUTANEOUS | Status: DC
Start: 2014-10-31 — End: 2014-10-31

## 2014-10-31 MED ORDER — INSULIN DETEMIR 100 UNIT/ML ~~LOC~~ SOLN
20.0000 [IU] | Freq: Every day | SUBCUTANEOUS | Status: DC
  Filled 2014-10-31: qty 0.2

## 2014-10-31 MED ORDER — INSULIN ASPART 100 UNIT/ML ~~LOC~~ SOLN
0.0000 [IU] | Freq: Every day | SUBCUTANEOUS | Status: DC
  Administered 2014-11-01: 2 [IU] via SUBCUTANEOUS

## 2014-10-31 MED ORDER — INSULIN ASPART 100 UNIT/ML ~~LOC~~ SOLN
15.0000 [IU] | Freq: Once | SUBCUTANEOUS | Status: AC
  Administered 2014-10-31: 15 [IU] via SUBCUTANEOUS

## 2014-10-31 MED ORDER — INSULIN DETEMIR 100 UNIT/ML ~~LOC~~ SOLN
20.0000 [IU] | Freq: Every day | SUBCUTANEOUS | Status: DC
Start: 2014-10-31 — End: 2014-11-01
  Administered 2014-10-31: 20 [IU] via SUBCUTANEOUS
  Filled 2014-10-31 (×2): qty 0.2

## 2014-10-31 MED ORDER — METOCLOPRAMIDE HCL 5 MG/5ML PO SOLN
10.0000 mg | Freq: Three times a day (TID) | ORAL | Status: DC
  Filled 2014-10-31 (×4): qty 10

## 2014-10-31 MED ORDER — ASPIRIN 81 MG PO CHEW
81.0000 mg | CHEWABLE_TABLET | Freq: Every day | ORAL | Status: DC
  Administered 2014-10-31 – 2014-11-05 (×6): 81 mg via ORAL
  Filled 2014-10-31 (×6): qty 1

## 2014-10-31 MED ORDER — HEPARIN (PORCINE) IN NACL 100-0.45 UNIT/ML-% IJ SOLN
750.0000 [IU]/h | INTRAMUSCULAR | Status: DC
  Filled 2014-10-31 (×2): qty 250

## 2014-10-31 MED ORDER — INSULIN ASPART 100 UNIT/ML ~~LOC~~ SOLN
0.0000 [IU] | Freq: Three times a day (TID) | SUBCUTANEOUS | Status: DC
  Administered 2014-11-01 – 2014-11-02 (×2): 1 [IU] via SUBCUTANEOUS
  Administered 2014-11-02: 2 [IU] via SUBCUTANEOUS
  Administered 2014-11-03: 3 [IU] via SUBCUTANEOUS
  Administered 2014-11-04 – 2014-11-05 (×2): 2 [IU] via SUBCUTANEOUS

## 2014-10-31 NOTE — Progress Notes (Signed)
CSW continuing to follow.   Pt and pt spouse hopeful for [facility]. Facility currently "considering" patient. CSW contacted facility to further discuss and facility stated that they wanted to speak with pt family further given that pt insurance only covers 60% of pt stay.   CSW to await determination from [facility] if they are willing to accept pt. Pt insurance requires authorization prior to pt discharge to SNF.   CSW to continue to follow.  Alison Murray, MSW, [place] Work [phone]

## 2014-10-31 NOTE — Progress Notes (Signed)
[place] for Heparin Indication: NSTEMI  Allergies  Allergen Reactions  . Sulfa Antibiotics Anaphylaxis and Swelling    Stiff neck also    Patient Measurements: Height: 5\' 4"  (162.6 cm) Weight: 160 lb 15 oz (73 kg) IBW/kg (Calculated) : 54.7 Heparin Dosing Weight: 70.7 kg  Vital Signs: Temp: 98.1 F (36.7 C) (02/22 0800) Temp Source: Oral (02/22 0800) BP: 138/44 mmHg (02/22 1100) Pulse Rate: 59 (02/22 1100)  Labs:  Recent Labs  10/29/14 0604  10/29/14 1455 10/30/14 0657 10/30/14 0840 10/30/14 1956 10/31/14 0003 10/31/14 0929  HGB 7.7*  --   --  8.4*  --   --   --   --   HCT 22.8*  --   --  25.1*  --   --   --   --   PLT 247  --   --  242  --   --   --   --   HEPARINUNFRC  --   --   --   --   --   --  0.71* 0.38  CREATININE 2.26*  < > 2.32*  --  2.16* 2.01*  --   --   TROPONINI  --   --   --  2.58*  --   --   --   --   < > = values in this interval not displayed.  Estimated Creatinine Clearance: 27.7 mL/min (by C-G formula based on Cr of 2.01).  Medical History: Past Medical History  Diagnosis Date  . Diabetes mellitus     on insulin, with h/o DKA   . HTN (hypertension)   . Hyperlipidemia   . Venous stasis ulcers   . Arthritis   . Depression     anxiety  . 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 Hypertension   . Coronary artery disease     angina.  MI.   . Pneumonia        . GERD (gastroesophageal reflux disease)     Hiatal hernia.   . Hypothyroidism   . CHF (congestive heart failure)   . Tuberculosis   . Osteoporosis    Assessment: 38 yoF re -admitted to [facility] on 2/17 from home for DKA. She was recently discharged from [facility] on 2/16 but returned when home health RN had "high" CBG readings. She was also noted to have increasing troponin, without chest pain. Heparin drip was initiated 2/18, then discontinued on 2/19. Echo done on 2/20 shows inferior wall akinesis and  overall left ventricular ejection fraction of 30-35%. Troponin this AM 2.58. Pharmacy is consulted to restart heparin infusion for NSTEMI with new inferior wall akinesis.  2/21: Heparin 5000 units SQ given at 1322- discontinued  Troponins 0.07 > 6.02 > 12.26 > 9.53 > 2.58  No bleeding issues reported.  No bolus, (received SQ heparin), Heparin infusion begun at 800 units/hr   Renal function poor, Cl < 30 ml/min  Today 2/22:  First HL was 0.71 units/ml on 800 units/hr  Heparin reduced to 750 units/hr, recheck HL in 8 hr  8 hr level = 0.38 units/ml - in goal range  Per Cards: continue Heparin infusion till am 2/23  SCr improving  Goal of Therapy:  Heparin level 0.3-0.7 units/ml Monitor platelets by anticoagulation protocol: Yes   Plan:   Continue Heparin at 750 units/hr (7.5 ml/hr)  Discontinue Heparin at 0800 tomorrow 2/23  No further Heparin levels  Wells Gerdeman,  Rona Ravens PharmD Pager [phone] 10/31/2014, 11:26 AM

## 2014-10-31 NOTE — Progress Notes (Signed)
ANTICOAGULATION CONSULT NOTE - Follow Up Consult  Pharmacy Consult for Heparin Indication: chest pain/ACS  Allergies  Allergen Reactions  . Sulfa Antibiotics Anaphylaxis and Swelling    Stiff neck also     Patient Measurements: Height: 5\' 4"  (162.6 cm) Weight: 167 lb 12.3 oz (76.1 kg) IBW/kg (Calculated) : 54.7 Heparin Dosing Weight:   Vital Signs: Temp: 98.2 F (36.8 C) (02/21 2000) Temp Source: Oral (02/21 2000) BP: 115/32 mmHg (02/22 0100) Pulse Rate: 72 (02/22 0100)  Labs:  Recent Labs  10/28/14 0604  10/29/14 0604  10/29/14 1455 10/30/14 0657 10/30/14 0840 10/30/14 1956 10/31/14 0003  HGB 8.7*  --  7.7*  --   --  8.4*  --   --   --   HCT 27.1*  --  22.8*  --   --  25.1*  --   --   --   PLT 320  --  247  --   --  242  --   --   --   HEPARINUNFRC  --   --   --   --   --   --   --   --  0.71*  CREATININE 2.49*  < > 2.26*  < > 2.32*  --  2.16* 2.01*  --   TROPONINI  --   --   --   --   --  2.58*  --   --   --   < > = values in this interval not displayed.  Estimated Creatinine Clearance: 28.3 mL/min (by C-G formula based on Cr of 2.01).   Medications:  Infusions:  . sodium chloride 10 mL/hr (10/29/14 1745)  . furosemide (LASIX) infusion 4 mg/hr (10/30/14 0916)  . heparin      Assessment: Patient with heparin level just above goal.  No issues per RN.  Goal of Therapy:  Heparin level 0.3-0.7 units/ml Monitor platelets by anticoagulation protocol: Yes   Plan:  Decrease heparin to 750 units/hr Recheck level at [place], Shea Stakes [place] 10/31/2014,1:33 AM

## 2014-10-31 NOTE — Progress Notes (Signed)
CRITICAL VALUE ALERT  Critical value received:  K+ (2.6)  Date of notification:  10/31/2014  Time of notification:  0013 am  Critical value read back: yes  Nurse who received alert:  Dyann Ruddle, RN  MD notified (1st page): [facility]  Time of first page:  0033 am  MD notified (2nd page):  Time of second page:  Responding MD:  [facility]  Time MD responded:  0045 am

## 2014-10-31 NOTE — Progress Notes (Addendum)
[facility] PROGRESS NOTE  Assessment/Plan: DKA (diabetic ketoacidoses)Metabolic acidosis: - Her DKA has resolved. - Cont Lantus plus SSI. Good control.  Acute on Chronic systolic and diastolic heart failure/Cardiorenal syndrome: - Started on IV lasix infusion with albumin with brisk diuresis. Will hold lasix and see how she does on her own. - She appears to euvolemic. - Monitor lytes. strict I and O's. - Continue Plavix.  Non-ST elevation MI: - She was started on heparin on admission it is been DC'd, her echo showed complete akinesia of the inferior wall she has a new left bundle-branch block on EKG compared to her previous EKG from 09/16/2014, repeated EKG showed Q waves. - Cardiology we'll continue her on Plavix and heparin. - she never had chest pain or SOB.   Acute renal failure: - Baseline creatinine on 10/08/2014 was 1.0, Probably due to cardiorenal syndrome - Cont to improve with diuresis.  Diabetic gastroparesis: - Appreciate GIs assistance continue small frequent meals. - cont reglan.  Protein-calorie malnutrition, severe - Small frequent meals. - Her albumin is 2.2 we'll get nutrition consult.  Encephalopathy acute - Most likely due to the metabolic derangement has resolved.  HyperkalemiaHypokalemia: - This was most likely due to acidosis, was now resolved. - repeted orally, Mag was 2.0 - repeat b-met in am.     Code Status: full Family Communication: none  Disposition Plan: inpatient   Consultants:  Gastroenterology  cardiology  Procedures:  Echo  Antibiotics:  None  HPI/Subjective: She relates she feels better than yesterday.  Objective: Filed Vitals:   10/31/14 0400 10/31/14 0500 10/31/14 0600 10/31/14 0700  BP: 111/39 132/42 121/35 100/51  Pulse: 66 79 73 78  Temp: 98.5 F (36.9 C)     TempSrc: Oral     Resp: 12 17 11 15   Height:      Weight: 73 kg (160 lb 15 oz)     SpO2: 95% 94% 92% 95%    Intake/Output Summary  (Last 24 hours) at 10/31/14 0741 Last data filed at 10/31/14 0617  Gross per 24 hour  Intake 1303.52 ml  Output   9000 ml  Net -7696.48 ml   Filed Weights   10/26/14 1613 10/28/14 0500 10/31/14 0400  Weight: 70.5 kg (155 lb 6.8 oz) 76.1 kg (167 lb 12.3 oz) 73 kg (160 lb 15 oz)    Exam:  General: Alert, awake, oriented x3, in no acute distress.  HEENT: No bruits, no goiter. - JVD Heart: Regular rate and rhythm. Trace edema. Lungs: Good air movement,clear lungs  Abdomen: Soft, nontender, nondistended, positive bowel sounds.  Neuro: Grossly intact, nonfocal.   Data Reviewed: Basic Metabolic Panel:  Recent Labs Lab 10/27/14 0255  10/28/14 0604  10/29/14 0604 10/29/14 1247 10/29/14 1455 10/30/14 0657 10/30/14 0840 10/30/14 1956 10/31/14 0555  NA 141  < > 137  < > 140 137 144  --  143 146*  --   K 3.3*  < > 5.0  < > 3.5 3.0* 4.6  --  3.0* 2.6*  --   CL 118*  < > 111  < > 120* 115* 119*  --  116* 111  --   CO2 17*  < > <5*  < > 15* 13* 16*  --  20 27  --   GLUCOSE 106*  < > 614*  < > 217* 475* 202*  --  125* 144*  --   BUN 28*  < > 28*  < > 24* 22 22  --  19 17  --   CREATININE 2.44*  < > 2.49*  < > 2.26* 2.36* 2.32*  --  2.16* 2.01*  --   CALCIUM 7.0*  < > 7.6*  < > 7.2* 7.0* 7.5*  --  7.5* 8.1*  --   MG 1.6  --  1.7  --  1.5  --   --  1.8  --   --  2.0  PHOS 3.1  --  4.3  --  3.1  --   --  2.4  --   --  2.4  < > = values in this interval not displayed. Liver Function Tests:  Recent Labs Lab 10/25/14 0552 10/28/14 1400  AST  --  56*  ALT  --  27  ALKPHOS  --  143*  BILITOT  --  1.0  PROT  --  4.8*  ALBUMIN 2.4* 2.2*    Recent Labs Lab 10/26/14 2207 10/28/14 1400  LIPASE 554* 117*  AMYLASE 335* 85   No results for input(s): AMMONIA in the last 168 hours. CBC:  Recent Labs Lab 10/26/14 1207 10/27/14 0255 10/28/14 0604 10/29/14 0604 10/30/14 0657  WBC 8.7 10.4 12.6* 5.5 5.3  NEUTROABS 7.7 7.8* 11.3* 3.7 3.1  HGB 9.8* 7.8* 8.7* 7.7* 8.4*  HCT  33.1* 23.3* 27.1* 22.8* 25.1*  MCV 102.2* 89.3 93.1 90.1 89.0  PLT 358 PLATELET CLUMPS NOTED ON SMEAR, COUNT APPEARS ADEQUATE 320 247 242   Cardiac Enzymes:  Recent Labs Lab 10/26/14 1501 10/26/14 2208 10/27/14 0255 10/27/14 1030 10/30/14 0657  TROPONINI 0.07* 6.02* 12.26* 9.53* 2.58*   BNP (last 3 results)  Recent Labs  09/19/14 1126  BNP 298.2*    ProBNP (last 3 results)  Recent Labs  11/14/13 0405 06/15/14 1035 07/26/14 1800  PROBNP 1470.0* 650.1* 2156.0*    CBG:  Recent Labs Lab 10/30/14 0749 10/30/14 1149 10/30/14 1548 10/30/14 1949 10/31/14 0017  GLUCAP 129* 100* 110* 114* 133*    Recent Results (from the past 240 hour(s))  Urine culture     Status: None   Collection Time: 10/22/14 12:20 PM  Result Value Ref Range Status   Specimen Description URINE, CATHETERIZED  Final   Special Requests NONE  Final   Colony Count NO GROWTH Performed at [facility]   Final   Culture NO GROWTH Performed at [facility]   Final   Report Status 10/23/2014 FINAL  Final  Blood culture (routine x 2)     Status: None (Preliminary result)   Collection Time: 10/26/14 12:34 PM  Result Value Ref Range Status   Specimen Description BLOOD RIGHT UPPER ARM  Final   Special Requests BOTTLES DRAWN AEROBIC AND ANAEROBIC 5CC  Final   Culture   Final           BLOOD CULTURE RECEIVED NO GROWTH TO DATE CULTURE WILL BE HELD FOR 5 DAYS BEFORE ISSUING A FINAL NEGATIVE REPORT Performed at [facility]    Report Status PENDING  Incomplete  Blood culture (routine x 2)     Status: None (Preliminary result)   Collection Time: 10/26/14 12:47 PM  Result Value Ref Range Status   Specimen Description BLOOD RIGHT AC  Final   Special Requests BOTTLES DRAWN AEROBIC AND ANAEROBIC 5ML  Final   Culture   Final           BLOOD CULTURE RECEIVED NO GROWTH TO DATE CULTURE WILL BE HELD FOR 5 DAYS BEFORE ISSUING A FINAL NEGATIVE REPORT Performed at  [facility]      Report Status PENDING  Incomplete  MRSA PCR Screening     Status: None   Collection Time: 10/26/14  4:27 PM  Result Value Ref Range Status   MRSA by PCR NEGATIVE NEGATIVE Final    Comment:        The GeneXpert MRSA Assay (FDA approved for NASAL specimens only), is one component of a comprehensive MRSA colonization surveillance program. It is not intended to diagnose MRSA infection nor to guide or monitor treatment for MRSA infections.      Studies: No results found.  Scheduled Meds: . antiseptic oral rinse  7 mL Mouth Rinse q12n4p  . aspirin  325 mg Oral Daily  . chlorhexidine  15 mL Mouth Rinse BID  . clopidogrel  75 mg Oral Q breakfast  . escitalopram  10 mg Oral Daily  . feeding supplement (GLUCERNA SHAKE)  237 mL Oral TID BM  . Glycerin (Adult)  1 suppository Rectal BID  . levothyroxine  62.5 mcg Intravenous Daily  . metoCLOPramide (REGLAN) injection  5 mg Intravenous 4 times per day  . nystatin  5 mL Oral QID  . nystatin   Topical BID  . pantoprazole (PROTONIX) IV  40 mg Intravenous Daily  . potassium chloride  40 mEq Oral TID  . rosuvastatin  40 mg Oral Daily   Continuous Infusions: . sodium chloride 10 mL/hr (10/29/14 1745)  . heparin 750 Units/hr (10/31/14 0140)     Charlynne Cousins  [facility] Pager [phone]. If 7PM-7AM, please contact night-coverage at [email or web address], password [facility] 10/31/2014, 7:41 AM  LOS: 5 days

## 2014-10-31 NOTE — Progress Notes (Signed)
Physical Therapy Treatment Patient Details Name: SAMARIYA DENTICE MRN: [id number] DOB: Jan 14, 1951 Today's Date: Nov 26, 2014    History of Present Illness [name], never smoker, with PMH of HTN, HLD, CAD s/p CABG, CHF, GERD, TB, osteoporosis / arthritis, depression and DM-1 (since age 86) who presented to [facility] on 2/17 via EMS after being seen by the home health RN with hyperglycemia. REcent hospitalization at [facility] 2/11-2/16/16 for DKA.    PT Comments    Pt assisted OOB however sitting in BM upon EOB so assisted to [employer]. Pt fatigued quickly to assisted to recliner after NT took pt's standing weight.  Continue to recommend SNF upon d/c.  Follow Up Recommendations  SNF;Supervision/Assistance - 24 hour     Equipment Recommendations  None recommended by PT    Recommendations for Other Services       Precautions / Restrictions Precautions Precautions: Fall    Mobility  Bed Mobility Overal bed mobility: Needs Assistance Bed Mobility: Supine to Sit     Supine to sit: Min guard;HOB elevated     General bed mobility comments: HOB elevated and pt used bed rail to assist with scooting to EOB, pt sitting in BM, NT in to assist with BSC and pericare  Transfers Overall transfer level: Needs assistance Equipment used: Rolling walker (2 wheeled) Transfers: Sit to/from [facility] Sit to Stand: Min assist         General transfer comment: assist for rise and steadying, verbal and tactile cues for safe technique, assisted to BSC then standing weight then to recliner, pt too fatigued for ambulation today  Ambulation/Gait                 Stairs            Wheelchair Mobility    Modified Rankin (Stroke Patients Only)       Balance                                    Cognition Arousal/Alertness: Awake/alert Behavior During Therapy: WFL for tasks assessed/performed Overall Cognitive Status: Within Functional Limits  for tasks assessed                      Exercises      General Comments        Pertinent Vitals/Pain      Home Living                      Prior Function            PT Goals (current goals can now be found in the care plan section) Progress towards PT goals: Progressing toward goals    Frequency  Min 3X/week    PT Plan Current plan remains appropriate    Co-evaluation             End of Session Equipment Utilized During Treatment: Gait belt Activity Tolerance: Patient limited by fatigue Patient left: with call bell/phone within reach;in chair     Time: 1430-1500 PT Time Calculation (min) (ACUTE ONLY): 30 min  Charges:  $Therapeutic Activity: 23-37 mins                    G Codes:      Lorin Hauck,KATHrine E 2014/11/26, 4:24 PM Carmelia Bake, PT, DPT 11-26-2014 Pager: [phone]

## 2014-10-31 NOTE — Progress Notes (Signed)
    Progress Note   Subjective  no nausea and no vomiting yesterday or today.    Objective   Vital signs in last 24 hours: Temp:  [97.8 F (36.6 C)-98.5 F (36.9 C)] 98.1 F (36.7 C) (02/22 0800) Pulse Rate:  [66-83] 83 (02/22 0915) Resp:  [11-20] 15 (02/22 0700) BP: (100-144)/(24-51) 135/48 mmHg (02/22 0915) SpO2:  [92 %-99 %] 95 % (02/22 0700) Weight:  [160 lb 15 oz (73 kg)] 160 lb 15 oz (73 kg) (02/22 0400) Last BM Date: 10/31/14 General:    white female in NAD Heart:  Regular rate and rhythm Abdomen:  Soft, nontender and nondistended. A few bowel sounds. Extremities:  Without edema. Neurologic:  Alert and oriented,  grossly normal neurologically. Psych:  Cooperative. Normal mood and affect.    Lab Results:  Recent Labs  10/29/14 0604 10/30/14 0657  WBC 5.5 5.3  HGB 7.7* 8.4*  HCT 22.8* 25.1*  PLT 247 242   BMET  Recent Labs  10/29/14 1455 10/30/14 0840 10/30/14 1956  NA 144 143 146*  K 4.6 3.0* 2.6*  CL 119* 116* 111  CO2 16* 20 27  GLUCOSE 202* 125* 144*  BUN 22 19 17   CREATININE 2.32* 2.16* 2.01*  CALCIUM 7.5* 7.5* 8.1*   LFT  Recent Labs  10/28/14 1400  PROT 4.8*  ALBUMIN 2.2*  AST 56*  ALT 27  ALKPHOS 143*  BILITOT 1.0  BILIDIR <0.1  IBILI NOT CALCULATED      Assessment / Plan:    87. 64 year old female admitted with DKA / metabolic acidosis. DKA rsolved. 2. Gastroparesis, improving. Continue Reglan 10mg , will change from IV to PO liquid. Clear liquids and advance as tolerated.  3.  ? NSTEMI. Troponin elevation in setting of DKA. No good candidate for cardiac cath right now. She is on Plavix and getting IV heparin     LOS: 5 days   Tye Savoy  10/31/2014, 10:25 AM     Attending physician's note   I have taken an interval history, reviewed the chart and examined the patient. I agree with the Advanced Practitioner's note, impression and recommendations. N/V have resolved while NPO. Change Reglan to PO. Clear liquids today and  advance as tolerated.   Pricilla Riffle. Fuller Plan, MD [facility]

## 2014-10-31 NOTE — Progress Notes (Signed)
    Subjective:  Feeling much better this morning. Denies chest pain or shortness of breath.  Objective:  Vital Signs in the last 24 hours: Temp:  [97.8 F (36.6 C)-98.5 F (36.9 C)] 98.1 F (36.7 C) (02/22 0800) Pulse Rate:  [66-82] 78 (02/22 0700) Resp:  [11-20] 15 (02/22 0700) BP: (100-144)/(24-51) 100/51 mmHg (02/22 0700) SpO2:  [92 %-99 %] 95 % (02/22 0700) Weight:  [160 lb 15 oz (73 kg)] 160 lb 15 oz (73 kg) (02/22 0400)  Intake/Output from previous day: 02/21 0701 - 02/22 0700 In: 1303.5 [I.V.:703.5; IV Piggyback:600] Out: 9000 [Urine:9000]  Physical Exam: Pt is alert and oriented, NAD HEENT: normal Neck: JVP - normal Lungs: CTA bilaterally CV: RRR without murmur or gallop Abd: soft, NT, Positive BS, no hepatomegaly Ext: Diffuse upper and lower extremity edema Skin: warm/dry no rash  Lab Results:  Recent Labs  10/29/14 0604 10/30/14 0657  WBC 5.5 5.3  HGB 7.7* 8.4*  PLT 247 242    Recent Labs  10/30/14 0840 10/30/14 1956  NA 143 146*  K 3.0* 2.6*  CL 116* 111  CO2 20 27  GLUCOSE 125* 144*  BUN 19 17  CREATININE 2.16* 2.01*    Recent Labs  10/30/14 0657  TROPONINI 2.58*    Cardiac Studies: 2-D echocardiogram: Study Conclusions  - Left ventricle: The cavity size was moderately dilated. Systolic function was moderately to severely reduced. The estimated ejection fraction was in the range of 30% to 35%. There is akinesis of the entireinferior myocardium. Features are consistent with a pseudonormal left ventricular filling pattern, with concomitant abnormal relaxation and increased filling pressure (grade 2 diastolic dysfunction). - Aortic valve: Trileaflet; normal thickness, mildly calcified leaflets. There was mild regurgitation. Valve area (Vmax): 2.12 cm^2. - Mitral valve: There was mild to moderate regurgitation. - Tricuspid valve: There was mild-moderate regurgitation. - Pulmonic valve: There was mild  regurgitation. - Pulmonary arteries: PA peak pressure: 45 mm Hg (S).  Impressions:  - The right ventricular systolic pressure was increased consistent with moderate pulmonary hypertension.  Tele: Personally reviewed, normal sinus rhythm  Assessment/Plan:  1. Non-STEMI with clear peak and now decline in troponin levels.this occurred in the setting of diabetic ketoacidosis and there were no typical symptoms of ACS.  2. Known ischemic heart disease status post CABG 2013  3. Transient left bundle branch block. Old EKGs reviewed and this has been intermittent now for several years  4. Nausea and vomiting with chronic diabetic gastroparesis  The patient is chronically ill and has severe diabetes.she had acute kidney injury at the time of her last admission earlier in February, and she may be that her new baseline now, but GFR remains less than 30. I do not think she is a good candidate for cardiac catheterization and would favor medical therapy.  Would continue aspirin, Plavix, and a statin drug. Will add carvedilol at a dose of 3.125 mg twice a day.  Bridget Martinez, M.D. 10/31/2014, 8:13 AM

## 2014-11-01 ENCOUNTER — Other Ambulatory Visit: Payer: Self-pay | Admitting: Physician Assistant

## 2014-11-01 DIAGNOSIS — I5023 Acute on chronic systolic (congestive) heart failure: Secondary | ICD-10-CM

## 2014-11-01 LAB — CULTURE, BLOOD (ROUTINE X 2)
Culture: NO GROWTH
Culture: NO GROWTH

## 2014-11-01 LAB — GLUCOSE, CAPILLARY
GLUCOSE-CAPILLARY: 149 mg/dL — AB (ref 70–99)
GLUCOSE-CAPILLARY: 228 mg/dL — AB (ref 70–99)
Glucose-Capillary: 59 mg/dL — ABNORMAL LOW (ref 70–99)
Glucose-Capillary: 88 mg/dL (ref 70–99)
Glucose-Capillary: 107 mg/dL — ABNORMAL HIGH (ref 70–99)
Glucose-Capillary: 100 mg/dL — ABNORMAL HIGH (ref 70–99)

## 2014-11-01 LAB — HEPATIC FUNCTION PANEL
ALT: 17 U/L (ref 0–35)
AST: 27 U/L (ref 0–37)
Albumin: 3.5 g/dL (ref 3.5–5.2)
Alkaline Phosphatase: 144 U/L — ABNORMAL HIGH (ref 39–117)
TOTAL PROTEIN: 5.7 g/dL — AB (ref 6.0–8.3)
Total Bilirubin: 0.6 mg/dL (ref 0.3–1.2)

## 2014-11-01 LAB — BASIC METABOLIC PANEL
ANION GAP: 6 (ref 5–15)
BUN: 16 mg/dL (ref 6–23)
CALCIUM: 8.4 mg/dL (ref 8.4–10.5)
CHLORIDE: 102 mmol/L (ref 96–112)
CO2: 31 mmol/L (ref 19–32)
Creatinine, Ser: 1.89 mg/dL — ABNORMAL HIGH (ref 0.50–1.10)
GFR calc Af Amer: 31 mL/min — ABNORMAL LOW (ref >90)
GFR, EST NON AFRICAN AMERICAN: 27 mL/min — AB (ref >90)
GLUCOSE: 76 mg/dL (ref 70–99)
POTASSIUM: 3.4 mmol/L — AB (ref 3.5–5.1)
Sodium: 139 mmol/L (ref 135–145)

## 2014-11-01 MED ORDER — POTASSIUM CHLORIDE 20 MEQ/15ML (10%) PO SOLN
40.0000 meq | Freq: Once | ORAL | Status: AC
  Administered 2014-11-01: 40 meq via ORAL
  Filled 2014-11-01: qty 30

## 2014-11-01 MED ORDER — PANTOPRAZOLE SODIUM 40 MG PO TBEC
40.0000 mg | DELAYED_RELEASE_TABLET | Freq: Every day | ORAL | Status: DC
  Administered 2014-11-02 – 2014-11-05 (×4): 40 mg via ORAL
  Filled 2014-11-01 (×4): qty 1

## 2014-11-01 MED ORDER — INSULIN DETEMIR 100 UNIT/ML ~~LOC~~ SOLN
16.0000 [IU] | Freq: Every day | SUBCUTANEOUS | Status: DC
  Administered 2014-11-01: 16 [IU] via SUBCUTANEOUS
  Filled 2014-11-01: qty 0.16

## 2014-11-01 NOTE — Progress Notes (Signed)
    Progress Note   Subjective  Some nausea with vomiting after taking a large group of meds this am. Otherwise, no significant nausea. Taking clears.    Objective   Vital signs in last 24 hours: Temp:  [97.4 F (36.3 C)-97.9 F (36.6 C)] 97.9 F (36.6 C) (02/23 0800) Pulse Rate:  [60-85] 68 (02/23 0800) Resp:  [12-20] 13 (02/23 0800) BP: (96-153)/(31-53) 123/43 mmHg (02/23 0800) SpO2:  [92 %-98 %] 97 % (02/23 0800) Weight:  [138 lb 3.7 oz (62.7 kg)] 138 lb 3.7 oz (62.7 kg) (02/22 1500) Last BM Date: 10/31/14 General:    white female in NAD Abdomen:  Soft, nontender and nondistended. Normal bowel sounds. Extremities:  Without edema. Neurologic:  Alert and oriented,  grossly normal neurologically. Psych:  Cooperative. Normal mood and affect.  Lab Results:  Recent Labs  10/30/14 0657  WBC 5.3  HGB 8.4*  HCT 25.1*  PLT 242   BMET  Recent Labs  10/30/14 1956 10/31/14 2000 11/01/14 0310  NA 146* 140 139  K 2.6* 3.1* 3.4*  CL 111 102 102  CO2 27 24 31   GLUCOSE 144* 213* 76  BUN 17 18 16   CREATININE 2.01* 2.16* 1.89*  CALCIUM 8.1* 8.2* 8.4   LFT  Recent Labs  11/01/14 0920  PROT 5.7*  ALBUMIN 3.5  AST 27  ALT 17  ALKPHOS 144*  BILITOT 0.6  BILIDIR <0.1  IBILI NOT CALCULATED      Assessment / Plan:    1. Gastroparesis. Nausea improved. Vomited after handful of meds this am but otherwise tolerated clears. She doesn't feel quite ready to advance to full liquids. Continue liquid Reglan (dose at 5mg  due to renal function). Continue daily PPI and prn Zofran. Good blood glucose control paramount.  2. DKA, resolved.  3. ? NSTEMI. Cardiology following. Remains on heparin and plavix.   4. AKI, improving.     LOS: 6 days   Tye Savoy  11/01/2014, 11:46 AM     Attending physician's note   I have taken an interval history, reviewed the chart and examined the patient. I agree with the Advanced Practitioner's note, impression and recommendations.  Clear liquids for now. Avoid taking multiple PO medications at the same time for now.   Pricilla Riffle. Fuller Plan, MD [facility]

## 2014-11-01 NOTE — Progress Notes (Addendum)
Inpatient Diabetes Program Recommendations  AACE/ADA: New Consensus Statement on Inpatient Glycemic Control (2013)  Target Ranges:  Prepandial:   less than 140 mg/dL      Peak postprandial:   less than 180 mg/dL (1-2 hours)      Critically ill patients:  140 - 180 mg/dL   Reason for Visit: Hypoglycemia  Results for Bridget, Martinez (MRN [id number]) as of 11/01/2014 10:46  Ref. Range 11/01/2014 04:30 11/01/2014 04:56 11/01/2014 08:12  Glucose-Capillary Latest Range: 70-99 mg/dL 59 (L) 88 107 (H)   Decrease Levemir to 15 units QHS. Increase Novolog to 5 units tidwc if pt eats >50% meal.  Will continue to follow. Thank you. Lorenda Peck, RD, LDN, CDE Inpatient Diabetes Coordinator [phone]

## 2014-11-01 NOTE — Progress Notes (Signed)
The patient is receiving Protonix by the intravenous route.  Based on criteria approved by the Pharmacy and [place], the medication is being converted to the equivalent oral dose form.  These criteria include: -No Active GI bleeding -Able to tolerate diet of full liquids (or better) or tube feeding -Able to tolerate other medications by the oral or enteral route  If you have any questions about this conversion, please contact the Pharmacy Department (phone [phone]).  Thank you.  Minda Ditto PharmD Pager [phone] 11/01/2014, 11:24 AM

## 2014-11-01 NOTE — Progress Notes (Signed)
CSW continuing to follow.   CSW received notification from [facility] that facility did not feel that they were able to offer pt a bed at this time.   CSW met with pt and pt husband at bedside this morning and notified that [facility] was unable to offer a bed. CSW discussed that pt has bed offer from [facility] and [place]. Pt states that those may be "possible". CSW discussed that pt insurance requires an authorization and it will be important to make a decision in advance in order to make sure authorization can be received. CSW encouraged pt and pt spouse to discuss with pt daughter, Danae Chen in order to make decision regarding SNF stay. Pt provided permission for CSW to contact pt daughter, Danae Chen via telephone.  CSW contacted pt daughter, Danae Chen via telephone, but unable to reach and pt daughter cell phone voice mailbox is full.   CSW to follow up with pt and pt family to continue to discuss SNF.   Alison Murray, MSW, [place] [facility] [phone]

## 2014-11-01 NOTE — Progress Notes (Signed)
CARE MANAGEMENT NOTE 11/01/2014  Patient:  Bridget Martinez, Bridget Martinez   Account Number:  [id number]  Date Initiated:  10/27/2014  Documentation initiated by:  Myra Weng  Subjective/Objective Assessment:   diabetic with glucose greater than 700     Action/Plan:   home when stable   Anticipated DC Date:  11/04/2014   Anticipated DC Plan:  [place]  In-house referral  NA      DC Planning Services  NA      [facility] Choice  NA   Choice offered to / List presented to:  NA   DME arranged  NA      DME agency  NA     [place] arranged  NA      [place] agency  NA   Status of service:  In process, will continue to follow Medicare Important Message given?   (If response is "NO", the following Medicare IM given date fields will be blank) Date Medicare IM given:   Medicare IM given by:   Date Additional Medicare IM given:   Additional Medicare IM given by:    Discharge Disposition:    Per UR Regulation:  Reviewed for med. necessity/level of care/duration of stay  If discussed at [place] of Stay Meetings, dates discussed:   11/01/2014    Comments:  Feb. 18 2016/Renda Pohlman L. Rosana Hoes, RN, BSN, CCM/Case Management [place] [phone] No discharge needs present of time of review.

## 2014-11-01 NOTE — Progress Notes (Addendum)
[facility] PROGRESS NOTE  Assessment/Plan: DKA (diabetic ketoacidoses)Metabolic acidosis: - Her DKA has resolved. - Cont Lantus plus SSI. Good control.  Acute on Chronic systolic and diastolic heart failure/Cardiorenal syndrome: - Cont to hold IV lasix. She cont to put large amount of out clear urine, CVP of 12, will probably need further diuresis. - She cont to be negative, She is negative 3 additional liters off lasix. Will watch off lasix. - Monitor lytes. strict I and O's. Replete K. - Continue Plavix.  Non-ST elevation MI: - Her echo showed complete akinesia of the inferior wall she has a new left bundle-branch block on EKG compared to her previous EKG from 09/16/2014,  - Cardiology consulted will continue her on Plavix and heparin. - She never had chest pain or SOB.   Acute renal failure: - Baseline creatinine on 10/08/2014 was 1.0, Probably due to cardiorenal syndrome - Cont to improve with diuresis.  Diabetic gastroparesis: - Appreciate GI's assistance continue small frequent meals. - cont reglan.  Protein-calorie malnutrition, severe - Small frequent meals. - Her albumin is 2.2 we'll get nutrition consult.  Encephalopathy acute - Most likely due to the metabolic derangement has resolved.  HyperkalemiaHypokalemia: - This was most likely due to acidosis, was now resolved. - repeted orally, Mag was 2.0 - repeat b-met in am.   Code Status: full Family Communication: none  Disposition Plan: inpatient   Consultants:  Gastroenterology  Cardiology  Procedures:  Echo  Antibiotics:  None  HPI/Subjective: She relates she feels better than yesterday.OOB to chair  Objective: Filed Vitals:   11/01/14 0300 11/01/14 0400 11/01/14 0500 11/01/14 0600  BP: 106/33 96/31 108/47 110/40  Pulse: 65 60 68 62  Temp:  97.4 F (36.3 C)    TempSrc:  Oral    Resp: _0 Height:      Weight:      SpO2: 93% 94% 98% 98%    Intake/Output Summary  (Last 24 hours) at 11/01/14 0824 Last data filed at 11/01/14 0600  Gross per 24 hour  Intake    717 ml  Output   4575 ml  Net  -3858 ml   Filed Weights   10/28/14 0500 10/31/14 0400 10/31/14 1500  Weight: 76.1 kg (167 lb 12.3 oz) 73 kg (160 lb 15 oz) 62.7 kg (138 lb 3.7 oz)    Exam:  General: Alert, awake, oriented x3, in no acute distress.  HEENT: No bruits, no goiter. - JVD Heart: Regular rate and rhythm. Trace edema. Lungs: Good air movement,clear lungs  Abdomen: Soft, nontender, nondistended, positive bowel sounds.  Neuro: Grossly intact, nonfocal.   Data Reviewed: Basic Metabolic Panel:  Recent Labs Lab 10/27/14 0255  10/28/14 0604  10/29/14 0604  10/29/14 1455 10/30/14 0657 10/30/14 0840 10/30/14 1956 10/31/14 0555 10/31/14 2000 11/01/14 0310  NA 141  < > 137  < > 140  < > 144  --  143 146*  --  140 139  K 3.3*  < > 5.0  < > 3.5  < > 4.6  --  3.0* 2.6*  --  3.1* 3.4*  CL 118*  < > 111  < > 120*  < > 119*  --  116* 111  --  102 102  CO2 17*  < > <5*  < > 15*  < > 16*  --  20 27  --  24 31  GLUCOSE 106*  < > 614*  < > 217*  < > 202*  --  125* 144*  --  213* 76  BUN 28*  < > 28*  < > 24*  < > 22  --  19 17  --  18 16  CREATININE 2.44*  < > 2.49*  < > 2.26*  < > 2.32*  --  2.16* 2.01*  --  2.16* 1.89*  CALCIUM 7.0*  < > 7.6*  < > 7.2*  < > 7.5*  --  7.5* 8.1*  --  8.2* 8.4  MG 1.6  --  1.7  --  1.5  --   --  1.8  --   --  2.0  --   --   PHOS 3.1  --  4.3  --  3.1  --   --  2.4  --   --  2.4  --   --   < > = values in this interval not displayed. Liver Function Tests:  Recent Labs Lab 10/28/14 1400  AST 56*  ALT 27  ALKPHOS 143*  BILITOT 1.0  PROT 4.8*  ALBUMIN 2.2*    Recent Labs Lab 10/26/14 2207 10/28/14 1400  LIPASE 554* 117*  AMYLASE 335* 85   No results for input(s): AMMONIA in the last 168 hours. CBC:  Recent Labs Lab 10/26/14 1207 10/27/14 0255 10/28/14 0604 10/29/14 0604 10/30/14 0657  WBC 8.7 10.4 12.6* 5.5 5.3  NEUTROABS 7.7  7.8* 11.3* 3.7 3.1  HGB 9.8* 7.8* 8.7* 7.7* 8.4*  HCT 33.1* 23.3* 27.1* 22.8* 25.1*  MCV 102.2* 89.3 93.1 90.1 89.0  PLT 358 PLATELET CLUMPS NOTED ON SMEAR, COUNT APPEARS ADEQUATE 320 247 242   Cardiac Enzymes:  Recent Labs Lab 10/26/14 1501 10/26/14 2208 10/27/14 0255 10/27/14 1030 10/30/14 0657  TROPONINI 0.07* 6.02* 12.26* 9.53* 2.58*   BNP (last 3 results)  Recent Labs  09/19/14 1126  BNP 298.2*    ProBNP (last 3 results)  Recent Labs  11/14/13 0405 06/15/14 1035 07/26/14 1800  PROBNP 1470.0* 650.1* 2156.0*    CBG:  Recent Labs Lab 10/31/14 1948 10/31/14 2234 10/31/14 2313 11/01/14 0430 11/01/14 0456  GLUCAP 250* 123* 132* 59* 88    Recent Results (from the past 240 hour(s))  Urine culture     Status: None   Collection Time: 10/22/14 12:20 PM  Result Value Ref Range Status   Specimen Description URINE, CATHETERIZED  Final   Special Requests NONE  Final   Colony Count NO GROWTH Performed at [facility]   Final   Culture NO GROWTH Performed at [facility]   Final   Report Status 10/23/2014 FINAL  Final  Blood culture (routine x 2)     Status: None   Collection Time: 10/26/14 12:34 PM  Result Value Ref Range Status   Specimen Description BLOOD RIGHT UPPER ARM  Final   Special Requests BOTTLES DRAWN AEROBIC AND ANAEROBIC 5CC  Final   Culture   Final    NO GROWTH 5 DAYS Performed at [facility]    Report Status 11/01/2014 FINAL  Final  Blood culture (routine x 2)     Status: None   Collection Time: 10/26/14 12:47 PM  Result Value Ref Range Status   Specimen Description BLOOD RIGHT [facility]  Final   Special Requests BOTTLES DRAWN AEROBIC AND ANAEROBIC 5ML  Final   Culture   Final    NO GROWTH 5 DAYS Performed at [facility]    Report Status 11/01/2014 FINAL  Final  MRSA PCR Screening  Status: None   Collection Time: 10/26/14  4:27 PM  Result Value Ref Range Status   MRSA by PCR NEGATIVE NEGATIVE Final     Comment:        The GeneXpert MRSA Assay (FDA approved for NASAL specimens only), is one component of a comprehensive MRSA colonization surveillance program. It is not intended to diagnose MRSA infection nor to guide or monitor treatment for MRSA infections.      Studies: No results found.  Scheduled Meds: . aspirin  81 mg Oral Daily  . carvedilol  3.125 mg Oral BID WC  . clopidogrel  75 mg Oral Q breakfast  . escitalopram  10 mg Oral Daily  . feeding supplement (GLUCERNA SHAKE)  237 mL Oral TID BM  . Glycerin (Adult)  1 suppository Rectal BID  . insulin aspart  0-5 Units Subcutaneous QHS  . insulin aspart  0-9 Units Subcutaneous TID WC  . insulin aspart  3 Units Subcutaneous TID WC  . insulin detemir  20 Units Subcutaneous QHS  . levothyroxine  125 mcg Oral QAC breakfast  . metoCLOPramide  5 mg Oral TID AC & HS  . nystatin  5 mL Oral QID  . nystatin   Topical BID  . pantoprazole (PROTONIX) IV  40 mg Intravenous Daily  . potassium chloride  40 mEq Oral TID  . rosuvastatin  40 mg Oral Daily   Continuous Infusions: . sodium chloride Stopped (11/01/14 0756)     Charlynne Cousins  [facility] Pager [phone]. If 7PM-7AM, please contact night-coverage at [email or web address], password [facility] 11/01/2014, 8:24 AM  LOS: 6 days

## 2014-11-02 ENCOUNTER — Ambulatory Visit (HOSPITAL_COMMUNITY): Payer: BC Managed Care – PPO

## 2014-11-02 ENCOUNTER — Inpatient Hospital Stay (HOSPITAL_COMMUNITY): Admission: RE | Admit: 2014-11-02 | Payer: BC Managed Care – PPO | Source: Ambulatory Visit

## 2014-11-02 DIAGNOSIS — D649 Anemia, unspecified: Secondary | ICD-10-CM

## 2014-11-02 DIAGNOSIS — E1010 Type 1 diabetes mellitus with ketoacidosis without coma: Principal | ICD-10-CM

## 2014-11-02 LAB — GLUCOSE, CAPILLARY
GLUCOSE-CAPILLARY: 111 mg/dL — AB (ref 70–99)
GLUCOSE-CAPILLARY: 133 mg/dL — AB (ref 70–99)
GLUCOSE-CAPILLARY: 149 mg/dL — AB (ref 70–99)
GLUCOSE-CAPILLARY: 207 mg/dL — AB (ref 70–99)
Glucose-Capillary: 127 mg/dL — ABNORMAL HIGH (ref 70–99)
Glucose-Capillary: 103 mg/dL — ABNORMAL HIGH (ref 70–99)
Glucose-Capillary: 47 mg/dL — ABNORMAL LOW (ref 70–99)
Glucose-Capillary: 66 mg/dL — ABNORMAL LOW (ref 70–99)
Glucose-Capillary: 69 mg/dL — ABNORMAL LOW (ref 70–99)
Glucose-Capillary: 146 mg/dL — ABNORMAL HIGH (ref 70–99)
Glucose-Capillary: 169 mg/dL — ABNORMAL HIGH (ref 70–99)
Glucose-Capillary: 156 mg/dL — ABNORMAL HIGH (ref 70–99)
Glucose-Capillary: 88 mg/dL (ref 70–99)

## 2014-11-02 LAB — IRON AND TIBC
Iron: 36 ug/dL — ABNORMAL LOW (ref 42–145)
SATURATION RATIOS: 17 % — AB (ref 20–55)
TIBC: 207 ug/dL — AB (ref 250–470)
UIBC: 171 ug/dL (ref 125–400)

## 2014-11-02 LAB — CBC
HCT: 24.6 % — ABNORMAL LOW (ref 36.0–46.0)
Hemoglobin: 8.1 g/dL — ABNORMAL LOW (ref 12.0–15.0)
MCH: 29.6 pg (ref 26.0–34.0)
MCHC: 32.9 g/dL (ref 30.0–36.0)
MCV: 89.8 fL (ref 78.0–100.0)
Platelets: 192 10*3/uL (ref 150–400)
RBC: 2.74 MIL/uL — AB (ref 3.87–5.11)
RDW: 14.8 % (ref 11.5–15.5)
WBC: 6.1 10*3/uL (ref 4.0–10.5)

## 2014-11-02 LAB — RETICULOCYTES
RBC.: 2.74 MIL/uL — ABNORMAL LOW (ref 3.87–5.11)
Retic Count, Absolute: 30.1 10*3/uL (ref 19.0–186.0)
Retic Ct Pct: 1.1 % (ref 0.4–3.1)

## 2014-11-02 LAB — BASIC METABOLIC PANEL
Anion gap: 6 (ref 5–15)
BUN: 14 mg/dL (ref 6–23)
CO2: 28 mmol/L (ref 19–32)
CREATININE: 1.62 mg/dL — AB (ref 0.50–1.10)
Calcium: 8.5 mg/dL (ref 8.4–10.5)
Chloride: 103 mmol/L (ref 96–112)
GFR, EST AFRICAN AMERICAN: 38 mL/min — AB (ref >90)
GFR, EST NON AFRICAN AMERICAN: 33 mL/min — AB (ref >90)
Glucose, Bld: 103 mg/dL — ABNORMAL HIGH (ref 70–99)
POTASSIUM: 3.9 mmol/L (ref 3.5–5.1)
Sodium: 137 mmol/L (ref 135–145)

## 2014-11-02 LAB — VITAMIN B12: VITAMIN B 12: 415 pg/mL (ref 211–911)

## 2014-11-02 LAB — FERRITIN: FERRITIN: 419 ng/mL — AB (ref 10–291)

## 2014-11-02 LAB — FOLATE: Folate: 8.7 ng/mL

## 2014-11-02 MED ORDER — INSULIN DETEMIR 100 UNIT/ML ~~LOC~~ SOLN
15.0000 [IU] | Freq: Every day | SUBCUTANEOUS | Status: DC
  Administered 2014-11-02: 15 [IU] via SUBCUTANEOUS
  Filled 2014-11-02: qty 0.15

## 2014-11-02 MED ORDER — ENOXAPARIN SODIUM 30 MG/0.3ML ~~LOC~~ SOLN
30.0000 mg | SUBCUTANEOUS | Status: DC
  Administered 2014-11-02 – 2014-11-05 (×4): 30 mg via SUBCUTANEOUS
  Filled 2014-11-02 (×4): qty 0.3

## 2014-11-02 MED ORDER — UNJURY CHICKEN SOUP POWDER
2.0000 [oz_av] | Freq: Three times a day (TID) | ORAL | Status: DC
  Administered 2014-11-02 – 2014-11-04 (×8): 2 [oz_av] via ORAL
  Filled 2014-11-02 (×11): qty 27

## 2014-11-02 NOTE — Progress Notes (Signed)
    Progress Note   Subjective  feels much better today. Tolerated about 1/3 to 1/2 of solid breakfast   Objective   Vital signs in last 24 hours: Temp:  [97.4 F (36.3 C)-98.6 F (37 C)] 97.9 F (36.6 C) (02/24 0800) Pulse Rate:  [64-75] 75 (02/23 2100) Resp:  [11-16] 15 (02/24 1000) BP: (101-156)/(34-66) 132/34 mmHg (02/24 1000) SpO2:  [96 %-100 %] 99 % (02/24 1000) Last BM Date: 10/31/14 General:    pleasanant white female in NAD Abdomen:  Soft, nontender and nondistended. Normal bowel sounds. .Neurologic:  Alert and oriented,  grossly normal neurologically. Psych:  Cooperative. Normal mood and affect.  Lab Results:  Recent Labs  11/02/14 0750  WBC 6.1  HGB 8.1*  HCT 24.6*  PLT 192   BMET  Recent Labs  10/31/14 2000 11/01/14 0310 11/02/14 0510  NA 140 139 137  K 3.1* 3.4* 3.9  CL 102 102 103  CO2 24 31 28   GLUCOSE 213* 76 103*  BUN 18 16 14   CREATININE 2.16* 1.89* 1.62*  CALCIUM 8.2* 8.4 8.5   LFT  Recent Labs  11/01/14 0920  PROT 5.7*  ALBUMIN 3.5  AST 27  ALT 17  ALKPHOS 144*  BILITOT 0.6  BILIDIR <0.1  IBILI NOT CALCULATED      Assessment / Plan:    1. Gastroparesis. Nausea resolved. Tolerating solids.   Continue liquid Reglan (dose at 5mg  due to renal function).   Continue daily PPI and prn Zofran.   Good blood glucose control paramount.   Small, frequent meals at home.   Patient wants to follow up with [place] in the office. I will see her in the office in 3-4 weeks, or sooner if need be.    2. Chronic normocytic anemia, stable. Probably multifactorial   LOS: 7 days   Tye Savoy  11/02/2014, 11:04 AM     Attending physician's note   I have taken a history, examined the patient and reviewed the chart. I agree with the Advanced Practitioner's note, impression and recommendations. Pt significantly improved. Continue Reglan, PPI and Zofran. Continue gastroparesis diet. Dr. Olevia Perches will be following her after visit with Tye Savoy, NP. Close follow up of her DM with optimal glucose control will significantly help her gastroparesis. GI signing off.   Ladene Artist, MD [facility]

## 2014-11-02 NOTE — Progress Notes (Signed)
    Subjective:  Pt feeling ok this am. She is sitting in chair eating breakfast. No chest pain or shortness of breath.  Objective:  Vital Signs in the last 24 hours: Temp:  [97.4 F (36.3 C)-98.6 F (37 C)] 97.9 F (36.6 C) (02/24 0400) Pulse Rate:  [64-75] 75 (02/23 2100) Resp:  [11-16] 14 (02/24 0600) BP: (101-145)/(36-66) 122/51 mmHg (02/24 0600) SpO2:  [96 %-100 %] 97 % (02/24 0600)  Intake/Output from previous day: 02/23 0701 - 02/24 0700 In: 880 [P.O.:700; I.V.:180] Out: 73 [Urine:730]  Physical Exam: Pt is alert and oriented, NAD HEENT: normal Neck: JVP - normal Lungs: CTA bilaterally CV: RRR without murmur or gallop Abd: soft, NT, Positive BS, no hepatomegaly Ext: no C/C/E, distal pulses intact and equal Skin: warm/dry no rash   Lab Results: No results for input(s): WBC, HGB, PLT in the last 72 hours.  Recent Labs  11/01/14 0310 11/02/14 0510  NA 139 137  K 3.4* 3.9  CL 102 103  CO2 31 28  GLUCOSE 76 103*  BUN 16 14  CREATININE 1.89* 1.62*   No results for input(s): TROPONINI in the last 72 hours.  Invalid input(s): CK, MB  Tele: Sinus rhythm, personally reviewed.  Assessment/Plan:  NSTEMI - patient stable without recurrent chest pain. Event occurred in setting of DKA. She is not a candidate for cath lab due to severe comorbidities and CKD. Doing well on med Rx with ASA, plavix, coreg, and crestor.  Other problems per [facility] service. 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 Please call if we can be of further assistance, but seems stable now from cardiac perspective.    Sherren Mocha, M.D. 11/02/2014, 8:18 AM

## 2014-11-02 NOTE — Progress Notes (Signed)
CSW continuing to follow.   CSW met with pt at bedside this morning. Pt awake and alert sitting in chair. Pt pleasant and actively involved in conversation. CSW inquired with pt if she and her family had further discussed rehab options. Pt stated that she and her family spoke about the possibility of [facility] and Rehab. CSW obtained permission from pt to contact [facility] in order to inquire about insurance coverage that [employer] as the insurance coverage was out of network with [facility].   CSW contacted [facility] and Rehab and notified facility of pt interest in facility, but pt and pt family wanted information on insurance coverage before making decision. Per [facility] and Rehab, the facility is in-network with pt insurance and pt insurance covers 80 percent until maximum out of pocket is met. Nanine Means stated that per [employer], pt out of pocket cost has been met and as long as this statement is accurate then pt will have no copayments or have to pay any cost upon admission.   CSW visited pt room again and pt husband and pt daughter, Danae Chen at bedside. CSW discussed with pt and pt family the information about coverage at [facility] and pt and pt family were relieved to hear that the rehab stay would be covered as long as pt out of pocket has been met. Pt daughter shared that she applied for Medicaid for pt yesterday. CSW provided positive reinforcement for pt daughter applying for Medicaid on pt behalf.   Pt states that she is agreeable to [facility] and Rehab for short term rehab placement. CSW provided support as pt shared that she does not yet know when she will be released from the hospital, but feels that rehab at [facility] will be best option.   CSW contacted [facility] and Rehab and notified facility of pt acceptance of bed offer. [facility] will initiate pt authorization request for  insurance closer to anticipated discharge date. Pt insurance [place] PPO requires authorization prior to pt discharge to SNF.   CSW to continue to follow to provide support and assist with pt disposition planning.  Alison Murray, MSW, [employer] [phone]

## 2014-11-02 NOTE — Progress Notes (Signed)
Physical Therapy Treatment Patient Details Name: TOPAZ ELDRETH MRN: [id number] DOB: 01-22-1951 Today's Date: 11/02/2014    History of Present Illness [name], never smoker, with PMH of HTN, HLD, CAD s/p CABG, CHF, GERD, TB, osteoporosis / arthritis, depression and DM-1 (since age 16) who presented to [facility] on 2/17 via EMS after being seen by the home health RN with hyperglycemia. REcent hospitalization at [facility] 2/11-2/16/16 for DKA.    PT Comments    Patient tolerated mobility well today, will plan to progress ambulation. BP 133/55.   Follow Up Recommendations  SNF;Supervision/Assistance - 24 hour     Equipment Recommendations  None recommended by PT    Recommendations for Other Services       Precautions / Restrictions Precautions Precautions: Fall Precaution Comments: hypoglycemia Restrictions Weight Bearing Restrictions: No    Mobility  Bed Mobility   Bed Mobility: Sit to Supine       Sit to supine: Min assist   General bed mobility comments: assist legs onto bed.  Transfers Overall transfer level: Needs assistance Equipment used: Rolling walker (2 wheeled) Transfers: Sit to/from Stand Sit to Stand: Min guard Stand pivot transfers: Min guard       General transfer comment: no assist to stand, only for safety.  Ambulation/Gait Ambulation/Gait assistance: Min guard Ambulation Distance (Feet): 20 Feet (then 30') Assistive device: Rolling walker (2 wheeled) Gait Pattern/deviations: Step-through pattern Gait velocity: Decreased   General Gait Details: ambulated with RW in room today.    Stairs            Wheelchair Mobility    Modified Rankin (Stroke Patients Only)       Balance           Standing balance support: During functional activity;Single extremity supported Standing balance-Leahy Scale: Fair Standing balance comment: for self pericare.                    Cognition Arousal/Alertness:  Awake/alert                          Exercises      General Comments        Pertinent Vitals/Pain Pain Assessment: No/denies pain    Home Living Family/patient expects to be discharged to:: Private residence                    Prior Function            PT Goals (current goals can now be found in the care plan section) Progress towards PT goals: Progressing toward goals    Frequency  Min 3X/week    PT Plan Current plan remains appropriate    Co-evaluation             End of Session   Activity Tolerance: Patient tolerated treatment well Patient left: in bed;with call bell/phone within reach;with bed alarm set     Time: [id number] PT Time Calculation (min) (ACUTE ONLY): 18 min  Charges:  $Gait Training: 8-22 mins                    G Codes:      Claretha Cooper 11/02/2014, 11:00 AM Tresa Endo PT [phone]

## 2014-11-02 NOTE — Progress Notes (Signed)
Pt morning CBG 66. Pt asymptomatic, V/S remain stable, and pt able to tolerate PO. Orange juice 240 ml given and a repeat CBG was taken 15 minutes later. CBG 103 . Will continue to monitor.

## 2014-11-02 NOTE — Progress Notes (Signed)
[facility] PROGRESS NOTE  Bridget Martinez [id number] DOB: 07-05-1951 DOA: 10/26/2014 PCP: [name], FNP  Assessment/Plan: #1 diabetic ketoacidosis/brittle diabetes Likely secondary to non-ST elevated MI. DKA has resolved. Patient with CBGs ranging from 47-133. Hemoglobin A1c is 10.0. Long acting insulin of Levemir will be decreased to 15 units daily. Continue meal coverage insulin. Sliding scale insulin. Follow.  #2 non-ST elevated MI Patient noted to have troponins that rose and subsequently trended back down. 2-D echo that she was depressed ejection fraction of 30-35% with akinesis of the entire inferior myocardium with grade 2 diastolic dysfunction. Patient was also noted to have a new left bundle branch block on EKG compared to a previous EKG from 09/16/2014. Patient denies any chest pain or shortness of breath. Patient has been seen by cardiology due to patient is stable without any recurrent chest pain. It was felt patient was not a candidate for cath lab due to his severe comorbidities and renal function. Continue aspirin, Plavix, Coreg, Crestor. Will likely need to follow-up with cardiology as outpatient.  #3 diabetic gastroparesis Improving on Reglan. Continue current dose of Reglan and diabetic diet.  #4 acute renal failure Questionable etiology. Patient was seen during last hospitalization by nephrology who felt patient's acute renal failure at that time was secondary to ATN. Renal function at been improving with diuresis. Patient diuresed and well currently off Lasix. Follow CVP. Follow.  #5 severe protein calorie malnutrition Continue current frequent meals. Continue nutrition supplementation.  #6 acute encephalopathy Likely secondary to problem #1 resolved.  #7 hypokalemia Repleted.  #8 anemia Check an anemia panel. Follow H&H.  #9 hypothyroidism Continue Synthroid.  #10 prophylaxis PPI for GI prophylaxis. Lovenox for DVT prophylaxis.    Code  Status: Full Family Communication: Updated patient of family at bedside. Disposition Plan: Remain the step down.   Consultants:  Cardiology: Dr. Mare Ferrari 10/30/2014  Gastroenterology: Dr. Carlean Purl 10/28/2014  Procedures:  Chest x-ray 10/26/2014  2-D echo 10/29/2014  Antibiotics:  IV vancomycin 10/26/2014>>> 10/27/2014  HPI/Subjective: Patient sitting up in chair eating breakfast. Patient denies any nausea or emesis. Patient denies any chest pain or shortness of breath.  Objective: Filed Vitals:   11/02/14 0800  BP:   Pulse:   Temp: 97.9 F (36.6 C)  Resp:     Intake/Output Summary (Last 24 hours) at 11/02/14 0847 Last data filed at 11/02/14 0100  Gross per 24 hour  Intake    880 ml  Output    730 ml  Net    150 ml   Filed Weights   10/28/14 0500 10/31/14 0400 10/31/14 1500  Weight: 76.1 kg (167 lb 12.3 oz) 73 kg (160 lb 15 oz) 62.7 kg (138 lb 3.7 oz)    Exam:   General:  NAD  Cardiovascular: RRR with 3/6 SEM  Respiratory: CTAB  Abdomen: Soft/NT/ND/+BS  Musculoskeletal: No clubbing cyanosis or edema.  Data Reviewed: Basic Metabolic Panel:  Recent Labs Lab 10/27/14 0255  10/28/14 0604  10/29/14 0604  10/30/14 [id number] 10/30/14 0840 10/30/14 1956 10/31/14 0555 10/31/14 2000 11/01/14 0310 11/02/14 0510  NA 141  < > 137  < > 140  < >  --  143 146*  --  140 139 137  K 3.3*  < > 5.0  < > 3.5  < >  --  3.0* 2.6*  --  3.1* 3.4* 3.9  CL 118*  < > 111  < > 120*  < >  --  116* 111  --  102 102  103  CO2 17*  < > <5*  < > 15*  < >  --  20 27  --  24 31 28   GLUCOSE 106*  < > 614*  < > 217*  < >  --  125* 144*  --  213* 76 103*  BUN 28*  < > 28*  < > 24*  < >  --  19 17  --  18 16 14   CREATININE 2.44*  < > 2.49*  < > 2.26*  < >  --  2.16* 2.01*  --  2.16* 1.89* 1.62*  CALCIUM 7.0*  < > 7.6*  < > 7.2*  < >  --  7.5* 8.1*  --  8.2* 8.4 8.5  MG 1.6  --  1.7  --  1.5  --  1.8  --   --  2.0  --   --   --   PHOS 3.1  --  4.3  --  3.1  --  2.4  --   --  2.4  --    --   --   < > = values in this interval not displayed. Liver Function Tests:  Recent Labs Lab 10/28/14 1400 11/01/14 0920  AST 56* 27  ALT 27 17  ALKPHOS 143* 144*  BILITOT 1.0 0.6  PROT 4.8* 5.7*  ALBUMIN 2.2* 3.5    Recent Labs Lab 10/26/14 2207 10/28/14 1400  LIPASE 554* 117*  AMYLASE 335* 85   No results for input(s): AMMONIA in the last 168 hours. CBC:  Recent Labs Lab 10/26/14 1207 10/27/14 0255 10/28/14 0604 10/29/14 0604 10/30/14 0657 11/02/14 0750  WBC 8.7 10.4 12.6* 5.5 5.3 6.1  NEUTROABS 7.7 7.8* 11.3* 3.7 3.1  --   HGB 9.8* 7.8* 8.7* 7.7* 8.4* 8.1*  HCT 33.1* 23.3* 27.1* 22.8* 25.1* 24.6*  MCV 102.2* 89.3 93.1 90.1 89.0 89.8  PLT 358 PLATELET CLUMPS NOTED ON SMEAR, COUNT APPEARS ADEQUATE 320 247 242 192   Cardiac Enzymes:  Recent Labs Lab 10/26/14 1501 10/26/14 2208 10/27/14 0255 10/27/14 1030 10/30/14 0657  TROPONINI 0.07* 6.02* 12.26* 9.53* 2.58*   BNP (last 3 results)  Recent Labs  09/19/14 1126  BNP 298.2*    ProBNP (last 3 results)  Recent Labs  11/14/13 0405 06/15/14 1035 07/26/14 1800  PROBNP 1470.0* 650.1* 2156.0*    CBG:  Recent Labs Lab 11/02/14 0354 11/02/14 0800 11/02/14 0801 11/02/14 0802 11/02/14 0828  GLUCAP 111* 47* 69* 66* 103*    Recent Results (from the past 240 hour(s))  Blood culture (routine x 2)     Status: None   Collection Time: 10/26/14 12:34 PM  Result Value Ref Range Status   Specimen Description BLOOD RIGHT UPPER ARM  Final   Special Requests BOTTLES DRAWN AEROBIC AND ANAEROBIC 5CC  Final   Culture   Final    NO GROWTH 5 DAYS Performed at [facility]    Report Status 11/01/2014 FINAL  Final  Blood culture (routine x 2)     Status: None   Collection Time: 10/26/14 12:47 PM  Result Value Ref Range Status   Specimen Description BLOOD RIGHT [facility]  Final   Special Requests BOTTLES DRAWN AEROBIC AND ANAEROBIC 5ML  Final   Culture   Final    NO GROWTH 5 DAYS Performed at  [facility]    Report Status 11/01/2014 FINAL  Final  MRSA PCR Screening     Status: None   Collection Time: 10/26/14  4:27  PM  Result Value Ref Range Status   MRSA by PCR NEGATIVE NEGATIVE Final    Comment:        The GeneXpert MRSA Assay (FDA approved for NASAL specimens only), is one component of a comprehensive MRSA colonization surveillance program. It is not intended to diagnose MRSA infection nor to guide or monitor treatment for MRSA infections.      Studies: No results found.  Scheduled Meds: . aspirin  81 mg Oral Daily  . carvedilol  3.125 mg Oral BID WC  . clopidogrel  75 mg Oral Q breakfast  . escitalopram  10 mg Oral Daily  . feeding supplement (GLUCERNA SHAKE)  237 mL Oral TID BM  . Glycerin (Adult)  1 suppository Rectal BID  . insulin aspart  0-5 Units Subcutaneous QHS  . insulin aspart  0-9 Units Subcutaneous TID WC  . insulin aspart  3 Units Subcutaneous TID WC  . insulin detemir  16 Units Subcutaneous QHS  . levothyroxine  125 mcg Oral QAC breakfast  . metoCLOPramide  5 mg Oral TID AC & HS  . nystatin  5 mL Oral QID  . nystatin   Topical BID  . pantoprazole  40 mg Oral Daily  . rosuvastatin  40 mg Oral Daily   Continuous Infusions: . sodium chloride 10 mL/hr at 11/01/14 1650    Principal Problem:   DKA, type 1 Active Problems:   NSTEMI (non-ST elevated myocardial infarction)   CAD, multiple vessel   DKA (diabetic ketoacidoses)   Acute on chronic systolic heart failure   DM (diabetes mellitus), type 1, uncontrolled w/neurologic complication   Acute on chronic renal failure   Encephalopathy acute   Hyperkalemia   Protein-calorie malnutrition, severe   Diabetic gastroparesis associated with type 1 diabetes mellitus   Anemia    Time spent: 53 mins    [facility] MD [facility] Pager [phone]. If 7PM-7AM, please contact night-coverage at [email or web address], password [facility] 11/02/2014, 8:47 AM  LOS: 7 days

## 2014-11-02 NOTE — Progress Notes (Addendum)
NUTRITION FOLLOW UP  Intervention:   - D/C Glucerna Shakes - Will order [employer] protein supplements TID- chicken noodle soup flavor- mix with water or chicken broth.  - Magic cup TID with meals, each supplement provides 290 kcal and 9 grams of protein  Nutrition Dx:   Inadequate oral intake related to DKA as evidenced by poor po intake and wt loss  Goal:   Pt to meet >/= 90% of their estimated nutrition needs   Monitor:   Weight trend, po intake, acceptance of supplements, labs  Assessment:   [name] with PMH of DM and recent d/c for DKA on 2/16 who was readmitted 2/17 with DKA.   2/18: Pt falling asleep during RD visit.  - Per chart history, pt has lost 29 lbs in ~3 months.  - Per RN, pt refused breakfast this morning.  - Pt seen by dietitian 2/15 and given diet education.  - Pt was able to wake up long enough to name several foods that contained carbohydrates. She said that she has been making changes to her diet. RD provided handout for pt once she is more awake.  2/24: - Pt reports that she is tolerating her diet, and appetite is improving. Pt ate ~50% of breakfast this morning. Reports that Glucerna Shakes cause nausea and vomiting. Agreed to try Magic Cup and [employer] supplements. Will send chicken noodle soup flavor which should be mixed with chicken broth or water.  - Pt with no questions about diabetic diet.  - Labs reviewed.   Height: Ht Readings from Last 1 Encounters:  10/26/14 5\' 4"  (1.626 m)    Weight Status:   Wt Readings from Last 1 Encounters:  10/31/14 138 lb 3.7 oz (62.7 kg)    Re-estimated needs:  Kcal: 1800-2000 Protein: 100-115 g Fluid: 1.8-2.0 L/day  Skin: Intact  Diet Order: Diet 2 gram sodium   Intake/Output Summary (Last 24 hours) at 11/02/14 1140 Last data filed at 11/02/14 1000  Gross per 24 hour  Intake    620 ml  Output    800 ml  Net   -180 ml    Last BM: 2/23   Labs:   Recent Labs Lab 10/29/14 0604   10/30/14 0657  10/31/14 0555 10/31/14 2000 11/01/14 0310 11/02/14 0510  NA 140  < >  --   < >  --  140 139 137  K 3.5  < >  --   < >  --  3.1* 3.4* 3.9  CL 120*  < >  --   < >  --  102 102 103  CO2 15*  < >  --   < >  --  24 31 28   BUN 24*  < >  --   < >  --  18 16 14   CREATININE 2.26*  < >  --   < >  --  2.16* 1.89* 1.62*  CALCIUM 7.2*  < >  --   < >  --  8.2* 8.4 8.5  MG 1.5  --  1.8  --  2.0  --   --   --   PHOS 3.1  --  2.4  --  2.4  --   --   --   GLUCOSE 217*  < >  --   < >  --  213* 76 103*  < > = values in this interval not displayed.  CBG (last 3)   Recent Labs  11/02/14 0802 11/02/14  [id number] 11/02/14 1037  GLUCAP 66* 103* 133*    Scheduled Meds: . aspirin  81 mg Oral Daily  . carvedilol  3.125 mg Oral BID WC  . clopidogrel  75 mg Oral Q breakfast  . enoxaparin (LOVENOX) injection  30 mg Subcutaneous Q24H  . escitalopram  10 mg Oral Daily  . feeding supplement (GLUCERNA SHAKE)  237 mL Oral TID BM  . Glycerin (Adult)  1 suppository Rectal BID  . insulin aspart  0-5 Units Subcutaneous QHS  . insulin aspart  0-9 Units Subcutaneous TID WC  . insulin aspart  3 Units Subcutaneous TID WC  . insulin detemir  15 Units Subcutaneous QHS  . levothyroxine  125 mcg Oral QAC breakfast  . metoCLOPramide  5 mg Oral TID AC & HS  . nystatin  5 mL Oral QID  . nystatin   Topical BID  . pantoprazole  40 mg Oral Daily  . rosuvastatin  40 mg Oral Daily    Continuous Infusions: . sodium chloride 10 mL/hr at 11/01/14 [address] [place], [place], [place]

## 2014-11-03 LAB — BASIC METABOLIC PANEL
Anion gap: 6 (ref 5–15)
BUN: 13 mg/dL (ref 6–23)
CO2: 28 mmol/L (ref 19–32)
Calcium: 8.3 mg/dL — ABNORMAL LOW (ref 8.4–10.5)
Chloride: 101 mmol/L (ref 96–112)
Creatinine, Ser: 1.48 mg/dL — ABNORMAL HIGH (ref 0.50–1.10)
GFR calc Af Amer: 42 mL/min — ABNORMAL LOW (ref >90)
GFR calc non Af Amer: 36 mL/min — ABNORMAL LOW (ref >90)
Glucose, Bld: 56 mg/dL — ABNORMAL LOW (ref 70–99)
Potassium: 3.3 mmol/L — ABNORMAL LOW (ref 3.5–5.1)
Sodium: 135 mmol/L (ref 135–145)

## 2014-11-03 LAB — GLUCOSE, CAPILLARY
GLUCOSE-CAPILLARY: 157 mg/dL — AB (ref 70–99)
Glucose-Capillary: 118 mg/dL — ABNORMAL HIGH (ref 70–99)
Glucose-Capillary: 45 mg/dL — ABNORMAL LOW (ref 70–99)
Glucose-Capillary: 72 mg/dL (ref 70–99)
Glucose-Capillary: 86 mg/dL (ref 70–99)
Glucose-Capillary: 212 mg/dL — ABNORMAL HIGH (ref 70–99)
Glucose-Capillary: 110 mg/dL — ABNORMAL HIGH (ref 70–99)

## 2014-11-03 LAB — CBC
HCT: 25.7 % — ABNORMAL LOW (ref 36.0–46.0)
HEMOGLOBIN: 8.4 g/dL — AB (ref 12.0–15.0)
MCH: 29.6 pg (ref 26.0–34.0)
MCHC: 32.7 g/dL (ref 30.0–36.0)
MCV: 90.5 fL (ref 78.0–100.0)
Platelets: 216 10*3/uL (ref 150–400)
RBC: 2.84 MIL/uL — ABNORMAL LOW (ref 3.87–5.11)
RDW: 14.6 % (ref 11.5–15.5)
WBC: 7.1 10*3/uL (ref 4.0–10.5)

## 2014-11-03 MED ORDER — INSULIN DETEMIR 100 UNIT/ML ~~LOC~~ SOLN
10.0000 [IU] | Freq: Every day | SUBCUTANEOUS | Status: DC
  Administered 2014-11-03 – 2014-11-04 (×2): 10 [IU] via SUBCUTANEOUS
  Filled 2014-11-03 (×4): qty 0.1

## 2014-11-03 MED ORDER — POTASSIUM CHLORIDE CRYS ER 20 MEQ PO TBCR
20.0000 meq | EXTENDED_RELEASE_TABLET | Freq: Once | ORAL | Status: AC
  Administered 2014-11-03: 20 meq via ORAL
  Filled 2014-11-03: qty 1

## 2014-11-03 MED ORDER — POTASSIUM CHLORIDE CRYS ER 20 MEQ PO TBCR
40.0000 meq | EXTENDED_RELEASE_TABLET | ORAL | Status: AC
  Administered 2014-11-03 (×2): 40 meq via ORAL
  Filled 2014-11-03 (×2): qty 2

## 2014-11-03 NOTE — Progress Notes (Signed)
Inpatient Diabetes Program Recommendations  AACE/ADA: New Consensus Statement on Inpatient Glycemic Control (2013)  Target Ranges:  Prepandial:   less than 140 mg/dL      Peak postprandial:   less than 180 mg/dL (1-2 hours)      Critically ill patients:  140 - 180 mg/dL   Reason for Visit: Hypoglycemia     Results for Bridget Martinez, Bridget Martinez (MRN [id number]) as of 11/03/2014 10:00  Ref. Range 11/02/2014 21:32 11/03/2014 00:05 11/03/2014 06:20 11/03/2014 06:43 11/03/2014 07:35  Glucose-Capillary Latest Range: 70-99 mg/dL 88 118 (H) 45 (L) 72 86   Hypoglycemia in am x past 3 days.  Decrease Levemir to 10 units QHS. Continue to decrease if FBS <70 mg/dL. Add CHO mod medium to heart healthy diet.  Will continue to follow. Thank you. Lorenda Peck, RD, LDN, CDE Inpatient Diabetes Coordinator [phone]

## 2014-11-03 NOTE — Evaluation (Signed)
Occupational Therapy Evaluation Patient Details Name: Bridget Martinez MRN: [id number] DOB: 1951/07/14 Today's Date: 11/03/2014    History of Present Illness [name], never smoker, with PMH of HTN, HLD, CAD s/p CABG, CHF, GERD, TB, osteoporosis / arthritis, depression and DM-1 (since age 65) who presented to [facility] on 2/17 via EMS after being seen by the home health RN with hyperglycemia. Recent hospitalization at [facility] 2/11-2/16/16 for DKA.   Clinical Impression   Pt was admitted for the above.  She will benefit from skilled OT to increase safety and independence with adls.  Pt was independent prior to admission.  She now needs min A overall.  Goals are set for supervision level in acute.    Follow Up Recommendations  SNF    Equipment Recommendations  None recommended by OT    Recommendations for Other Services       Precautions / Restrictions Precautions Precautions: Fall Precaution Comments: hypoglycemia Restrictions Weight Bearing Restrictions: No      Mobility Bed Mobility   Bed Mobility: Sit to Supine       Sit to supine: Min assist   General bed mobility comments: assist for bil LEs onto bed  Transfers   Equipment used: 1 person hand held assist Transfers: Sit to/from [facility] Sit to Stand: Min assist Stand pivot transfers: Min assist       General transfer comment: steadying assistance    Balance                                            ADL Overall ADL's : Needs assistance/impaired     Grooming: Set up;Sitting   Upper Body Bathing: Set up;Sitting   Lower Body Bathing: Minimal assistance;Sit to/from stand   Upper Body Dressing : Minimal assistance;Sitting (lines)   Lower Body Dressing: Minimal assistance;Sit to/from stand   Toilet Transfer: Minimal assistance;Stand-pivot;BSC   Toileting- Clothing Manipulation and Hygiene: Set up;Sitting/lateral lean         General ADL  Comments: pt feels her strength is returning:  she is normally independent      Estate agent      Pertinent Vitals/Pain Pain Assessment: No/denies pain     Hand Dominance     Extremity/Trunk Assessment Upper Extremity Assessment Upper Extremity Assessment: Generalized weakness           Communication Communication Communication: No difficulties   Cognition Arousal/Alertness: Awake/alert Behavior During Therapy: WFL for tasks assessed/performed Overall Cognitive Status: Within Functional Limits for tasks assessed                     General Comments       Exercises       Shoulder Instructions      Home Living Family/patient expects to be discharged to:: Private residence Living Arrangements: Spouse/significant other Available Help at Discharge: Family;Available 24 hours/day               Bathroom Shower/Tub: Occupational psychologist: Standard     Home Equipment: Environmental consultant - 2 wheels;Cane - single point;Wheelchair - [employer];Shower seat          Prior Functioning/Environment Level of Independence: Independent             OT Diagnosis: Generalized weakness   OT Problem  List: Decreased strength;Decreased activity tolerance;Impaired balance (sitting and/or standing)   OT Treatment/Interventions: Self-care/ADL training;DME and/or AE instruction;Therapeutic activities;Balance training;Patient/family education    OT Goals(Current goals can be found in the care plan section) Acute Rehab OT Goals Patient Stated Goal: get strength back; get back to being independent OT Goal Formulation: With patient Time For Goal Achievement: 11/17/14 Potential to Achieve Goals: Good ADL Goals Pt Will Perform Lower Body Bathing: with supervision;sit to/from stand Pt Will Perform Lower Body Dressing: with supervision;sit to/from stand Pt Will Transfer to Toilet: with supervision;ambulating;bedside commode Pt Will  Perform Toileting - Clothing Manipulation and hygiene: with supervision;sit to/from stand  OT Frequency: Min 2X/week   Barriers to D/C:            Co-evaluation              End of Session    Activity Tolerance: Patient tolerated treatment well Patient left: in bed;in CPM   Time: 1135-1151 OT Time Calculation (min): 16 min Charges:  OT General Charges $OT Visit: 1 Procedure OT Evaluation $Initial OT Evaluation Tier I: 1 Procedure G-Codes:    Asley Baskerville 11/20/2014, 12:24 PM  Lesle Chris, OTR/L [phone] 11/20/14

## 2014-11-03 NOTE — Progress Notes (Signed)
[facility] PROGRESS NOTE  Bridget Martinez [id number] DOB: Jun 02, 1951 DOA: 10/26/2014 PCP: [name], FNP  Assessment/Plan: #1 diabetic ketoacidosis/brittle diabetes Likely secondary to non-ST elevated MI. DKA has resolved. Patient with CBGs ranging from 45-156. Hemoglobin A1c is 10.0. Long acting insulin of Levemir will be decreased to 10 units daily. Continue meal coverage insulin. Sliding scale insulin. Follow.  #2 non-ST elevated MI Patient noted to have troponins that rose and subsequently trended back down. 2-D echo that she was depressed ejection fraction of 30-35% with akinesis of the entire inferior myocardium with grade 2 diastolic dysfunction. Patient was also noted to have a new left bundle branch block on EKG compared to a previous EKG from 09/16/2014. Patient denies any chest pain or shortness of breath. Patient has been seen by cardiology due to patient is stable without any recurrent chest pain. It was felt patient was not a candidate for cath lab due to his severe comorbidities and renal function. Continue aspirin, Plavix, Coreg, Crestor. Will likely need to follow-up with cardiology as outpatient.  #3 diabetic gastroparesis Improving on Reglan. Continue current dose of Reglan and diabetic diet. Outpatient follow-up with GI.  #4 acute renal failure Questionable etiology. Patient was seen during last hospitalization by nephrology who felt patient's acute renal failure at that time was secondary to ATN. Renal function improving daily with auto diuresis. Patient diuresing well currently off Lasix. Last CVP was 6. Follow.  #5 severe protein calorie malnutrition Continue current frequent meals. Continue nutrition supplementation.  #6 acute encephalopathy Likely secondary to problem #1 resolved.  #7 hypokalemia Replete.  #8 anemia Anemia panel consistent with anemia of chronic disease. H&H stable.  #9 hypothyroidism Continue Synthroid.  #10  prophylaxis PPI for GI prophylaxis. Lovenox for DVT prophylaxis.    Code Status: Full Family Communication: Updated patient, no family at bedside. Disposition Plan: transfer to telemetry.   Consultants:  Cardiology: Dr. Mare Ferrari 10/30/2014  Gastroenterology: Dr. Carlean Purl 10/28/2014  Procedures:  Chest x-ray 10/26/2014  2-D echo 10/29/2014  Antibiotics:  IV vancomycin 10/26/2014>>> 10/27/2014  HPI/Subjective: Patient sitting up in chair. Patient denies any nausea or emesis. Patient denies any chest pain or shortness of breath.  Objective: Filed Vitals:   11/03/14 1000  BP: 139/63  Pulse:   Temp:   Resp: 17    Intake/Output Summary (Last 24 hours) at 11/03/14 1042 Last data filed at 11/03/14 0930  Gross per 24 hour  Intake    570 ml  Output   1050 ml  Net   -480 ml   Filed Weights   10/28/14 0500 10/31/14 0400 10/31/14 1500  Weight: 76.1 kg (167 lb 12.3 oz) 73 kg (160 lb 15 oz) 62.7 kg (138 lb 3.7 oz)    Exam:   General:  NAD  Cardiovascular: RRR with 3/6 SEM  Respiratory: CTAB  Abdomen: Soft/NT/ND/+BS  Musculoskeletal: No clubbing cyanosis or edema.  Data Reviewed: Basic Metabolic Panel:  Recent Labs Lab 10/28/14 0604  10/29/14 0604  10/30/14 [id number]  10/30/14 1956 10/31/14 0555 10/31/14 2000 11/01/14 0310 11/02/14 0510 11/03/14 0415  NA 137  < > 140  < >  --   < > 146*  --  140 139 137 135  K 5.0  < > 3.5  < >  --   < > 2.6*  --  3.1* 3.4* 3.9 3.3*  CL 111  < > 120*  < >  --   < > 111  --  102 102 103 101  CO2 <5*  < >  15*  < >  --   < > 27  --  24 31 28 28   GLUCOSE 614*  < > 217*  < >  --   < > 144*  --  213* 76 103* 56*  BUN 28*  < > 24*  < >  --   < > 17  --  18 16 14 13   CREATININE 2.49*  < > 2.26*  < >  --   < > 2.01*  --  2.16* 1.89* 1.62* 1.48*  CALCIUM 7.6*  < > 7.2*  < >  --   < > 8.1*  --  8.2* 8.4 8.5 8.3*  MG 1.7  --  1.5  --  1.8  --   --  2.0  --   --   --   --   PHOS 4.3  --  3.1  --  2.4  --   --  2.4  --   --   --    --   < > = values in this interval not displayed. Liver Function Tests:  Recent Labs Lab 10/28/14 1400 11/01/14 0920  AST 56* 27  ALT 27 17  ALKPHOS 143* 144*  BILITOT 1.0 0.6  PROT 4.8* 5.7*  ALBUMIN 2.2* 3.5    Recent Labs Lab 10/28/14 1400  LIPASE 117*  AMYLASE 85   No results for input(s): AMMONIA in the last 168 hours. CBC:  Recent Labs Lab 10/28/14 0604 10/29/14 0604 10/30/14 0657 11/02/14 0750 11/03/14 0415  WBC 12.6* 5.5 5.3 6.1 7.1  NEUTROABS 11.3* 3.7 3.1  --   --   HGB 8.7* 7.7* 8.4* 8.1* 8.4*  HCT 27.1* 22.8* 25.1* 24.6* 25.7*  MCV 93.1 90.1 89.0 89.8 90.5  PLT 320 247 242 192 216   Cardiac Enzymes:  Recent Labs Lab 10/30/14 0657  TROPONINI 2.58*   BNP (last 3 results)  Recent Labs  09/19/14 1126  BNP 298.2*    ProBNP (last 3 results)  Recent Labs  11/14/13 0405 06/15/14 1035 07/26/14 1800  PROBNP 1470.0* 650.1* 2156.0*    CBG:  Recent Labs Lab 11/02/14 2132 11/03/14 0005 11/03/14 0620 11/03/14 0643 11/03/14 0735  GLUCAP 88 118* 45* 72 86    Recent Results (from the past 240 hour(s))  Blood culture (routine x 2)     Status: None   Collection Time: 10/26/14 12:34 PM  Result Value Ref Range Status   Specimen Description BLOOD RIGHT UPPER ARM  Final   Special Requests BOTTLES DRAWN AEROBIC AND ANAEROBIC 5CC  Final   Culture   Final    NO GROWTH 5 DAYS Performed at [facility]    Report Status 11/01/2014 FINAL  Final  Blood culture (routine x 2)     Status: None   Collection Time: 10/26/14 12:47 PM  Result Value Ref Range Status   Specimen Description BLOOD RIGHT AC  Final   Special Requests BOTTLES DRAWN AEROBIC AND ANAEROBIC 5ML  Final   Culture   Final    NO GROWTH 5 DAYS Performed at [facility]    Report Status 11/01/2014 FINAL  Final  MRSA PCR Screening     Status: None   Collection Time: 10/26/14  4:27 PM  Result Value Ref Range Status   MRSA by PCR NEGATIVE NEGATIVE Final    Comment:         The GeneXpert MRSA Assay (FDA approved for NASAL specimens only), is one component of a comprehensive MRSA  colonization surveillance program. It is not intended to diagnose MRSA infection nor to guide or monitor treatment for MRSA infections.      Studies: No results found.  Scheduled Meds: . aspirin  81 mg Oral Daily  . carvedilol  3.125 mg Oral BID WC  . clopidogrel  75 mg Oral Q breakfast  . enoxaparin (LOVENOX) injection  30 mg Subcutaneous Q24H  . escitalopram  10 mg Oral Daily  . Glycerin (Adult)  1 suppository Rectal BID  . insulin aspart  0-5 Units Subcutaneous QHS  . insulin aspart  0-9 Units Subcutaneous TID WC  . insulin aspart  3 Units Subcutaneous TID WC  . insulin detemir  10 Units Subcutaneous QHS  . levothyroxine  125 mcg Oral QAC breakfast  . metoCLOPramide  5 mg Oral TID AC & HS  . nystatin  5 mL Oral QID  . nystatin   Topical BID  . pantoprazole  40 mg Oral Daily  . potassium chloride  40 mEq Oral Q4H  . protein supplement  2 oz Oral TID  . rosuvastatin  40 mg Oral Daily   Continuous Infusions: . sodium chloride 10 mL/hr at 11/01/14 1650    Principal Problem:   DKA, type 1 Active Problems:   NSTEMI (non-ST elevated myocardial infarction)   CAD, multiple vessel   DKA (diabetic ketoacidoses)   Acute on chronic systolic heart failure   DM (diabetes mellitus), type 1, uncontrolled w/neurologic complication   Acute on chronic renal failure   Encephalopathy acute   Hyperkalemia   Protein-calorie malnutrition, severe   Diabetic gastroparesis associated with type 1 diabetes mellitus   Anemia    Time spent: 52 mins    [facility] MD [facility] Pager [phone]. If 7PM-7AM, please contact night-coverage at [email or web address], password [facility] 11/03/2014, 10:42 AM  LOS: 8 days

## 2014-11-03 NOTE — Progress Notes (Signed)
CSW continuing to follow.  Pt plans for [facility] upon discharge.   CSW sent updated clinicals to [facility] and spoke to admissions coordinator who plans to initiate [place] PPO authorization for SNF.   Pt requires authorization through insurance for SNF prior to pt discharge to SNF.   CSW to continue to follow to provide support and assist with pt transition to [facility] when pt medically stable for discharge and insurance authorization received.   Alison Murray, MSW, [place] [facility] [phone]

## 2014-11-03 NOTE — Progress Notes (Signed)
Pt transferred from ICU. Pt stable with no complaints. No changes from this am's assessment. Will continue to monitor pt.   Othella Boyer [facility] 11/03/2014 1515

## 2014-11-03 NOTE — Progress Notes (Signed)
Hypoglycemic Event  CBG:45  Treatment: 15 GM carbohydrate snack  Symptoms: Shaky  Follow-up CBG: [id number] CBG Result:72  Possible Reasons for Event: Inadequate meal intake  Comments/MD notified: MD already aware    Bridget Martinez  Remember to initiate Hypoglycemia Order Set & complete

## 2014-11-04 ENCOUNTER — Other Ambulatory Visit (HOSPITAL_COMMUNITY): Payer: Self-pay | Admitting: Anesthesiology

## 2014-11-04 DIAGNOSIS — E0810 Diabetes mellitus due to underlying condition with ketoacidosis without coma: Secondary | ICD-10-CM | POA: Insufficient documentation

## 2014-11-04 LAB — GLUCOSE, CAPILLARY
GLUCOSE-CAPILLARY: 111 mg/dL — AB (ref 70–99)
Glucose-Capillary: 155 mg/dL — ABNORMAL HIGH (ref 70–99)
Glucose-Capillary: 90 mg/dL (ref 70–99)

## 2014-11-04 LAB — CBC
HCT: 23.3 % — ABNORMAL LOW (ref 36.0–46.0)
Hemoglobin: 7.7 g/dL — ABNORMAL LOW (ref 12.0–15.0)
MCH: 30 pg (ref 26.0–34.0)
MCHC: 33 g/dL (ref 30.0–36.0)
MCV: 90.7 fL (ref 78.0–100.0)
PLATELETS: 193 10*3/uL (ref 150–400)
RBC: 2.57 MIL/uL — AB (ref 3.87–5.11)
RDW: 14.8 % (ref 11.5–15.5)
WBC: 7.2 10*3/uL (ref 4.0–10.5)

## 2014-11-04 LAB — BASIC METABOLIC PANEL
Anion gap: 10 (ref 5–15)
BUN: 11 mg/dL (ref 6–23)
CHLORIDE: 99 mmol/L (ref 96–112)
CO2: 25 mmol/L (ref 19–32)
CREATININE: 1.58 mg/dL — AB (ref 0.50–1.10)
Calcium: 8.3 mg/dL — ABNORMAL LOW (ref 8.4–10.5)
GFR calc non Af Amer: 34 mL/min — ABNORMAL LOW (ref >90)
GFR, EST AFRICAN AMERICAN: 39 mL/min — AB (ref >90)
Glucose, Bld: 127 mg/dL — ABNORMAL HIGH (ref 70–99)
Potassium: 4.2 mmol/L (ref 3.5–5.1)
Sodium: 134 mmol/L — ABNORMAL LOW (ref 135–145)

## 2014-11-04 LAB — HEMOGLOBIN AND HEMATOCRIT, BLOOD
HCT: 24.4 % — ABNORMAL LOW (ref 36.0–46.0)
HEMOGLOBIN: 8.1 g/dL — AB (ref 12.0–15.0)

## 2014-11-04 MED ORDER — METOCLOPRAMIDE HCL 5 MG/5ML PO SOLN: 5.0000 mg | Freq: Three times a day (TID) | ORAL | Status: DC

## 2014-11-04 MED ORDER — GLYCERIN (LAXATIVE) 2.1 G RE SUPP: 1.0000 | Freq: Two times a day (BID) | RECTAL | Status: DC

## 2014-11-04 MED ORDER — CARVEDILOL 3.125 MG PO TABS: 3.1250 mg | ORAL_TABLET | Freq: Two times a day (BID) | ORAL | Status: DC

## 2014-11-04 MED ORDER — UNJURY CHICKEN SOUP POWDER: 2.0000 [oz_av] | Freq: Three times a day (TID) | ORAL | Status: DC

## 2014-11-04 MED ORDER — INSULIN DETEMIR 100 UNIT/ML ~~LOC~~ SOLN: 10.0000 [IU] | Freq: Every day | SUBCUTANEOUS | Status: DC

## 2014-11-04 MED ORDER — NYSTATIN 100000 UNIT/GM EX POWD: CUTANEOUS | Status: DC

## 2014-11-04 MED ORDER — ENOXAPARIN SODIUM 30 MG/0.3ML ~~LOC~~ SOLN: 30.0000 mg | SUBCUTANEOUS | Status: DC

## 2014-11-04 NOTE — Discharge Summary (Addendum)
Physician Discharge Summary  Bridget Martinez [id number] DOB: [birth year] DOA: 10/26/2014  PCP: [name], FNP  Admit date: 10/26/2014 Discharge date: 11/05/2014  Time spent: 70 minutes  Recommendations for Outpatient Follow-up:  1. Follow up with [name], FNP in 1 week, basic metabolic profile need to be checked to follow-up on electrolytes and renal function. 2. Follow up with Dr Haroldine Laws, cardiology on 11/15/14 at 240pm for further management or recent NSTEMI. Patient will need BMET done on follow up. 3. Follow up with MD at SNF. Patient will need a basic metabolic profile done in 1 week to follow-up on electrolytes and renal function. 4. Patient needs PT,  5 times per week.  Discharge Diagnoses:  Principal Problem:   DKA, type 1 Active Problems:   NSTEMI (non-ST elevated myocardial infarction)   CAD, multiple vessel   DKA (diabetic ketoacidoses)   Acute on chronic systolic heart failure   DM (diabetes mellitus), type 1, uncontrolled w/neurologic complication   Acute on chronic renal failure   Encephalopathy acute   Hyperkalemia   Protein-calorie malnutrition, severe   Diabetic gastroparesis associated with type 1 diabetes mellitus   Anemia   Diabetic ketoacidosis without coma associated with diabetes mellitus due to underlying condition   Discharge Condition: stable and improved.  Diet recommendation: carb modified diet.  Filed Weights   10/31/14 1500 11/04/14 1733 11/05/14 0546  Weight: 62.7 kg (138 lb 3.7 oz) 67.767 kg (149 lb 6.4 oz) 67.8 kg (149 lb 7.6 oz)    History of present illness:  [name], never smoker, with PMH of HTN, HLD, CAD s/p CABG, CHF, GERD, TB, osteoporosis / arthritis, depression and DM-1 (since age 48) who presented to [facility] on 2/17 via EMS after being seen by the home health RN with CBG readings of 'high'.   She was admitted to [facility] from 2/11 - 10/25/14 for DKA secondary to UTI and AKI. Renal  US was negative for hydronephrosis. She was evaluated by Nephrology for AKI & insulin regimen was adjusted per the Diabetes Coordinator. She was noted to have very brittle diabetes and difficult to control with both hypo / hyperglycemia in the same day. She was planned to follow up with Dr. Buddy Duty on 2/18 but was admitted to [facility] on 2/17. Discharge sr cr plateaued around 2.6. Husband reports he felt she was not ready for discharge reporting she was slow to dress. After they arrived home, he noted her to have a slurring of speech. She reported only taking 4 units of insulin after discharge at home because she was afraid of hypoglycemia.   ER work up notable for Na 131, K 6.2, Cl 94, Cr 3.47, glucose 1096, AG 31, WBC 8.7, Hgb 9.8, MCV 102.2, UA neg for UTI. PCCM called for ICU admission.   Hospital Course:  #1 diabetic ketoacidosis/brittle diabetes Likely secondary to non-ST elevated MI. Patient on admission was admitted to the critical care service in DKA. Patient was placed on the glucose stabilizer, placed on IV fluids and followed. Patient improved clinically and subsequently DKA resolved. Hemoglobin A1c which was obtained came back elevated at 10. Patient was subsequently started on long-acting Levemir and sliding scale insulin. Patient was subsequently transferred to the hospitalist service. Due to some bouts of hypoglycemia patient's long-acting insulin of Levemir dose was titrated down and patient be discharged on 10 units daily. Patient also be discharged on new coverage insulin of 3 units daily as long as patient is eating more than  50% of her meals. Patient improved clinically did not have any further bouts of DKA or hypoglycemia and will be discharged in stable and improved condition.   #2 non-ST elevated MI Patient noted to have troponins that rose and subsequently trended back down. 2-D echo that was done showed a depressed ejection fraction of 30-35% with akinesis of the entire inferior  myocardium with grade 2 diastolic dysfunction. Patient was also noted to have a new left bundle branch block on EKG compared to a previous EKG from 09/16/2014. Patient denied any chest pain or shortness of breath. Patient was seen by cardiology: due to patient being stable without any recurrent chest pain. It was felt patient was not a candidate for cath lab due to his severe comorbidities and renal function. Patient was maintained on medical regimen of aspirin, Plavix, Coreg, Crestor. Patient will follow-up with her cardiologist as outpatient.   #3 diabetic gastroparesis During the hospitalization patient was noted to have nausea and emesis with intermittent right upper quadrant pain. Gastroenterology was consulted and followed the patient throughout the hospitalization. Patient was noted to have increased nausea over the past several months prior to admission on a daily basis with intermittent episodes of vomiting with a feeling of site and a feeling as if the food did not move out of her stomach. Patient also with some complaints of heartburn. It was felt by GI that patient likely had gastroparesis. Patient's A1c was noted to be 10. Patient was maintained on Zofran Protonix and subsequently her Reglan dose was increased initially to 10 mg and then decrease back to 5 mg daily. Patient was placed on glycerin suppository twice daily. Patient improved clinically was tolerating a diet and will be discharged on Reglan 5 mg before meals and at bedtime. She will follow-up with GI as outpatient.. Improving on Reglan. Continue current dose of Reglan and diabetic diet. Outpatient follow-up with GI.  #4 acute on chronic renal failure Questionable etiology. Patient was seen during last hospitalization by nephrology who felt patient's acute renal failure at that time was secondary to ATN. Renal function improved daily with diuresis and then after Lasix was discontinued auto diuresis. Patient diuresing well currently off  Lasix. Last CVP was 6. Patient's renal function continued to improve and seemed to have plateaued around 1.58. Outpatient follow-up.   #5 severe protein calorie malnutrition Patient was placed on frequent meals, and nutrition supplementation.  #6 acute encephalopathy Likely secondary to problem #1 resolved.  #7 hypokalemia Repleted.  #8 anemia Anemia panel consistent with anemia of chronic disease. H&H remained stable.  #9 hypothyroidism Continued on home regimen of Synthroid.   Procedures:  Chest x-ray 10/26/2014  2-D echo 10/29/2014    Consultations:  Cardiology: Dr. Mare Ferrari 10/30/2014  Gastroenterology: Dr. Carlean Purl 10/28/2014    Discharge Exam: Filed Vitals:   11/05/14 0546  BP: 132/47  Pulse: 77  Temp: 98 F (36.7 C)  Resp: 18    General: NAD Cardiovascular: RRR Respiratory: CTAB  Discharge Instructions   Discharge Instructions    Diet Carb Modified    Complete by:  As directed      Discharge instructions    Complete by:  As directed   Follow up with [name], FNP in 1 week. Follow up with Dr Haroldine Laws on 11/15/14 at 240pm Follow up with Tye Savoy, PA, [place] gastroenterology in 3 weeks.     Increase activity slowly    Complete by:  As directed  Current Discharge Medication List    START taking these medications   Details  enoxaparin (LOVENOX) 30 MG/0.3ML injection Inject 0.3 mLs (30 mg total) into the skin daily. Qty: 10 mL, Refills: 0    Glycerin, Adult, 2.1 G SUPP Place 1 suppository rectally 2 (two) times daily. Qty: 60 suppository, Refills: 0    metoCLOPramide (REGLAN) 5 MG/5ML solution Take 5 mLs (5 mg total) by mouth 4 (four) times daily -  before meals and at bedtime. Qty: 600 mL, Refills: 0    nystatin (MYCOSTATIN/NYSTOP) 100000 UNIT/GM POWD Apply to groin area 2 times daily. Qty: 60 g, Refills: 0    protein supplement (UNJURY CHICKEN SOUP) POWD Take 7 g (2 oz total) by mouth 3 (three) times daily.       CONTINUE these medications which have CHANGED   Details  carvedilol (COREG) 3.125 MG tablet Take 1 tablet (3.125 mg total) by mouth 2 (two) times daily with a meal. Qty: 62 tablet, Refills: 0    insulin detemir (LEVEMIR) 100 UNIT/ML injection Inject 0.1 mLs (10 Units total) into the skin daily. Qty: 10 mL, Refills: 0      CONTINUE these medications which have NOT CHANGED   Details  clopidogrel (PLAVIX) 75 MG tablet Take 1 tablet (75 mg total) by mouth daily with breakfast. Qty: 30 tablet, Refills: 2    escitalopram (LEXAPRO) 10 MG tablet Take 1 tablet (10 mg total) by mouth daily. Qty: 30 tablet, Refills: 11   Associated Diagnoses: Depression    insulin aspart (NOVOLOG) 100 UNIT/ML injection Inject 3 Units into the skin 3 (three) times daily with meals. Qty: 10 mL, Refills: 11    levothyroxine (SYNTHROID, LEVOTHROID) 125 MCG tablet Take 1 tablet (125 mcg total) by mouth daily. Qty: 30 tablet, Refills: 11   Associated Diagnoses: Unspecified hypothyroidism    ondansetron (ZOFRAN ODT) 8 MG disintegrating tablet Take 1 tablet (8 mg total) by mouth every 8 (eight) hours as needed for nausea or vomiting. Qty: 20 tablet, Refills: 1    pantoprazole (PROTONIX) 40 MG tablet Take 1 tablet (40 mg total) by mouth daily. Qty: 30 tablet, Refills: 3    rosuvastatin (CRESTOR) 40 MG tablet Take 1 tablet (40 mg total) by mouth daily. Qty: 30 tablet, Refills: 11   Associated Diagnoses: Other and unspecified hyperlipidemia    Vitamin D, Ergocalciferol, (DRISDOL) 50000 UNITS CAPS capsule Take 1 capsule by mouth every Saturday.     aspirin EC 81 MG tablet Take 1 tablet (81 mg total) by mouth daily. Qty: 30 tablet, Refills: 3      STOP taking these medications     metoCLOPramide (REGLAN) 10 MG tablet        Allergies  Allergen Reactions  . Sulfa Antibiotics Anaphylaxis and Swelling    Stiff neck also    Follow-up Information    Follow up with [name], FNP. Schedule  an appointment as soon as possible for a visit in 1 week.   Specialty:  Nurse Practitioner   Contact information:   [place] [address] [phone]       Follow up with Glori Bickers, MD On 11/15/2014.   Specialty:  Cardiology   Why:  f/u at 240pm. Be there at 2 pm   Contact information:   [address] [place] [address] [phone]       Follow up with Tye Savoy, NP. Schedule an appointment as soon as possible for a visit in 3  weeks.   Specialty:  Nurse Practitioner   Why:  Wednesday, March 16th at 9:00 a.m.   Contact information:   [address] [place] [address] [phone]        The results of significant diagnostics from this hospitalization (including imaging, microbiology, ancillary and laboratory) are listed below for reference.    Significant Diagnostic Studies: US Renal  10/22/2014   CLINICAL DATA:  Acute renal failure.  EXAM: RENAL/URINARY TRACT ULTRASOUND COMPLETE  COMPARISON:  CT of the abdomen and pelvis on 07/30/2014 and ultrasound 05/23/2012  FINDINGS: Right Kidney:  Length: 12.8 cm. Small upper pole cyst is 0.9 x 0.8 x 1.0 cm. No hydronephrosis. Normal renal echogenicity.  Left Kidney:  Length: 11.6 cm. Echogenicity within normal limits. No mass or hydronephrosis visualized.  Bladder:  The bladder is partially decompressed by a Foley catheter.  IMPRESSION: 1. No hydronephrosis. 2. Stable right renal cyst.   Electronically Signed   By: Nolon Nations M.D.   On: 10/22/2014 10:42   Portable Chest X-ray (1 View)  10/26/2014   CLINICAL DATA:  Diabetic ketoacidosis.  Hypertension  EXAM: PORTABLE CHEST - 1 VIEW  COMPARISON:  October 21, 2014.  FINDINGS: Lungs are clear. Heart is mildly enlarged with pulmonary vascularity within normal limits. Patient is status post coronary artery bypass grafting. No adenopathy. No bone lesions.  IMPRESSION: Heart mildly enlarged.  No lung edema or consolidation.    Electronically Signed   By: Lowella Grip III M.D.   On: 10/26/2014 15:21   Dg Chest Port 1 View  10/21/2014   CLINICAL DATA:  Shortness of breath.  EXAM: PORTABLE CHEST - 1 VIEW  COMPARISON:  September 18, 2014.  FINDINGS: The heart size and mediastinal contours are within normal limits. No pneumothorax or pleural effusion is noted. Status post Coronary artery bypass graft. No acute pulmonary disease is noted. The visualized skeletal structures are unremarkable.  IMPRESSION: No acute cardiopulmonary abnormality seen.   Electronically Signed   By: Marijo Conception, M.D.   On: 10/21/2014 08:55   Dg Abd Portable 2v  10/21/2014   CLINICAL DATA:  Vomiting.  EXAM: PORTABLE ABDOMEN - 2 VIEW  COMPARISON:  July 27, 2014.  FINDINGS: The bowel gas pattern is normal. There is no evidence of free air. No radio-opaque calculi or other significant radiographic abnormality is seen. Stool is noted in the rectum.  IMPRESSION: No evidence of bowel obstruction or ileus.   Electronically Signed   By: Marijo Conception, M.D.   On: 10/21/2014 08:52    Microbiology: Recent Results (from the past 240 hour(s))  Blood culture (routine x 2)     Status: None   Collection Time: 10/26/14 12:34 PM  Result Value Ref Range Status   Specimen Description BLOOD RIGHT UPPER ARM  Final   Special Requests BOTTLES DRAWN AEROBIC AND ANAEROBIC 5CC  Final   Culture   Final    NO GROWTH 5 DAYS Performed at [facility]    Report Status 11/01/2014 FINAL  Final  Blood culture (routine x 2)     Status: None   Collection Time: 10/26/14 12:47 PM  Result Value Ref Range Status   Specimen Description BLOOD RIGHT [facility]  Final   Special Requests BOTTLES DRAWN AEROBIC AND ANAEROBIC 5ML  Final   Culture   Final    NO GROWTH 5 DAYS Performed at [facility]    Report Status 11/01/2014 FINAL  Final  MRSA PCR Screening  Status: None   Collection Time: 10/26/14  4:27 PM  Result Value Ref Range Status   MRSA by PCR  NEGATIVE NEGATIVE Final    Comment:        The GeneXpert MRSA Assay (FDA approved for NASAL specimens only), is one component of a comprehensive MRSA colonization surveillance program. It is not intended to diagnose MRSA infection nor to guide or monitor treatment for MRSA infections.      Labs: Basic Metabolic Panel:  Recent Labs Lab 10/30/14 [id number]  10/31/14 0555  11/01/14 0310 11/02/14 0510 11/03/14 0415 11/04/14 0455 11/05/14 0805  NA  --   < >  --   < > 139 137 135 134* 133*  K  --   < >  --   < > 3.4* 3.9 3.3* 4.2 3.6  CL  --   < >  --   < > 102 103 101 99 102  CO2  --   < >  --   < > 31 28 28 25 27   GLUCOSE  --   < >  --   < > 76 103* 56* 127* 87  BUN  --   < >  --   < > 16 14 13 11 13   CREATININE  --   < >  --   < > 1.89* 1.62* 1.48* 1.58* 1.66*  CALCIUM  --   < >  --   < > 8.4 8.5 8.3* 8.3* 8.4  MG 1.8  --  2.0  --   --   --   --   --   --   PHOS 2.4  --  2.4  --   --   --   --   --   --   < > = values in this interval not displayed. Liver Function Tests:  Recent Labs Lab 11/01/14 0920  AST 27  ALT 17  ALKPHOS 144*  BILITOT 0.6  PROT 5.7*  ALBUMIN 3.5   No results for input(s): LIPASE, AMYLASE in the last 168 hours. No results for input(s): AMMONIA in the last 168 hours. CBC:  Recent Labs Lab 10/30/14 0657 11/02/14 0750 11/03/14 0415 11/04/14 0455 11/04/14 1310 11/05/14 0805  WBC 5.3 6.1 7.1 7.2  --  6.7  NEUTROABS 3.1  --   --   --   --   --   HGB 8.4* 8.1* 8.4* 7.7* 8.1* 8.1*  HCT 25.1* 24.6* 25.7* 23.3* 24.4* 24.6*  MCV 89.0 89.8 90.5 90.7  --  90.4  PLT 242 192 216 193  --  232   Cardiac Enzymes:  Recent Labs Lab 10/30/14 0657  TROPONINI 2.58*   BNP: BNP (last 3 results)  Recent Labs  09/19/14 1126  BNP 298.2*    ProBNP (last 3 results)  Recent Labs  11/14/13 0405 06/15/14 1035 07/26/14 1800  PROBNP 1470.0* 650.1* 2156.0*    CBG:  Recent Labs Lab 11/04/14 0730 11/04/14 1155 11/04/14 1659 11/04/14 2105  11/05/14 0745  GLUCAP 111* 155* 90 138* 92       Signed:  THOMPSON,DANIEL MD [facility] 11/05/2014, 10:13 AM    11/05/2014    10:11 AM  addendum No changes to discharge summary. Patient stable for discharge.

## 2014-11-04 NOTE — Progress Notes (Signed)
Physical Therapy Treatment Patient Details Name: Bridget Martinez MRN: [id number] DOB: Jun 21, 1951 Today's Date: 11-12-14    History of Present Illness [name], never smoker, with PMH of HTN, HLD, CAD s/p CABG, CHF, GERD, TB, osteoporosis / arthritis, depression and DM-1 (since age 7) who presented to [facility] on 2/17 via EMS after being seen by the home health RN with hyperglycemia. Recent hospitalization at [facility] 2/11-2/16/16 for DKA.    PT Comments    Pt able to progress to ambulating in hallway however required a couple rest breaks due to reported LE weakness.  Pt hopeful to d/c to rehab soon.  Follow Up Recommendations  SNF;Supervision/Assistance - 24 hour     Equipment Recommendations  None recommended by PT    Recommendations for Other Services       Precautions / Restrictions Precautions Precautions: Fall Precaution Comments: hypoglycemia    Mobility  Bed Mobility Overal bed mobility: Needs Assistance Bed Mobility: Supine to Sit     Supine to sit: Supervision;HOB elevated        Transfers Overall transfer level: Needs assistance Equipment used: Rolling walker (2 wheeled) Transfers: Sit to/from Stand Sit to Stand: Min guard         General transfer comment: verbal cues for hand placement  Ambulation/Gait Ambulation/Gait assistance: Min guard   Assistive device: Rolling walker (2 wheeled) Gait Pattern/deviations: Step-through pattern;Trunk flexed;Decreased stride length Gait velocity: Decreased   General Gait Details: 60 x2 with seated rest break then 120 feet back to room, recliner following, pt reports increased weakness   Stairs            Wheelchair Mobility    Modified Rankin (Stroke Patients Only)       Balance                                    Cognition Arousal/Alertness: Awake/alert Behavior During Therapy: WFL for tasks assessed/performed Overall Cognitive Status: Within Functional Limits  for tasks assessed                      Exercises      General Comments        Pertinent Vitals/Pain Pain Assessment: No/denies pain    Home Living                      Prior Function            PT Goals (current goals can now be found in the care plan section) Progress towards PT goals: Progressing toward goals    Frequency  Min 3X/week    PT Plan Current plan remains appropriate    Co-evaluation             End of Session Equipment Utilized During Treatment: Gait belt Activity Tolerance: Patient tolerated treatment well Patient left: with call bell/phone within reach;in chair     Time: 1052-1106 PT Time Calculation (min) (ACUTE ONLY): 14 min  Charges:  $Gait Training: 8-22 mins                    G Codes:      Camila Maita,KATHrine E 11-12-2014, 12:25 PM Carmelia Bake, PT, DPT November 12, 2014 Pager: [phone]

## 2014-11-04 NOTE — Care Management Note (Signed)
    Page 1 of 2   11/04/2014     2:56:39 PM CARE MANAGEMENT NOTE 11/04/2014  Patient:  Bridget Martinez, Bridget Martinez   Account Number:  [id number]  Date Initiated:  10/27/2014  Documentation initiated by:  [name],[name]  Subjective/Objective Assessment:   diabetic with glucose greater than 700     Action/Plan:   home when stable   Anticipated DC Date:  11/04/2014   Anticipated DC Plan:  [place]  In-house referral  NA      DC Planning Services  CM consult      PAC Choice  NA   Choice offered to / List presented to:  NA   DME arranged  NA      DME agency  NA     [place] arranged  NA      [place] agency  NA   Status of service:  Completed, signed off Medicare Important Message given?   (If response is "NO", the following Medicare IM given date fields will be blank) Date Medicare IM given:   Medicare IM given by:   Date Additional Medicare IM given:   Additional Medicare IM given by:    Discharge Disposition:  [place]  Per UR Regulation:  Reviewed for med. necessity/level of care/duration of stay  If discussed at [place] of Stay Meetings, dates discussed:   11/01/2014  11/03/2014    Comments:  11/04/14 Dessa Phi RN BSN NCM [phone] d/c snf.  Feb. 18 2016/[name], RN, BSN, CCM/Case Management [name] [phone] No discharge needs present of time of review.

## 2014-11-04 NOTE — Progress Notes (Addendum)
[employer] authorization obtained. Per Lattie Haw at [facility] they can take patient tomorrow, not today. Weekend CSW, Rollene Fare to facilitate discharge tomorrow (ph#: [phone]). Patient, husband & Dr. Grandville Silos aware.   Clinical Social Work Department CLINICAL SOCIAL WORK PLACEMENT NOTE 11/04/2014  Patient: Bridget Martinez, Bridget Martinez Account Number: [id number] Admit date: 10/26/2014  Clinical Social Worker: Loletta Specter Date/time: 10/28/2014 02:36 PM  Clinical Social Work is seeking post-discharge placement for this patient at the following level of care: [place] (*CSW will update this form in Epic as items are completed)   10/28/2014 Patient/family provided with [place] Department of Clinical Social Work's list of facilities offering this level of care within the geographic area requested by the patient (or if unable, by the patient's family).  10/28/2014 Patient/family informed of their freedom to choose among providers that offer the needed level of care, that participate in Medicare, Medicaid or managed care program needed by the patient, have an available bed and are willing to accept the patient.  10/28/2014 Patient/family informed of [facility]' ownership interest in [facility], as well as of the fact that they are under no obligation to receive care at this facility.  PASARR submitted to EDS on 10/28/2014 PASARR number received on 10/28/2014  FL2 transmitted to all facilities in geographic area requested by pt/family on 10/28/2014 FL2 transmitted to all facilities within larger geographic area on   Patient informed that his/her managed care company has contracts with or will negotiate with certain facilities, including the following:    Patient/family informed of bed offers received: 11/04/2014 Patient chooses bed at [facility] Physician recommends and patient chooses bed at   Patient to be transferred to  [facility] on 11/05/2014 Patient to be transferred to facility by Patient's husband Patient and family notified of transfer on 11/05/2014 Name of family member notified: patient's husband, Bridget Martinez at bedside  The following physician request were entered in Epic:   Additional Comments:   Raynaldo Opitz, [place] Social Worker cell #: [phone]

## 2014-11-05 LAB — CBC
HCT: 24.6 % — ABNORMAL LOW (ref 36.0–46.0)
Hemoglobin: 8.1 g/dL — ABNORMAL LOW (ref 12.0–15.0)
MCH: 29.8 pg (ref 26.0–34.0)
MCHC: 32.9 g/dL (ref 30.0–36.0)
MCV: 90.4 fL (ref 78.0–100.0)
Platelets: 232 10*3/uL (ref 150–400)
RBC: 2.72 MIL/uL — ABNORMAL LOW (ref 3.87–5.11)
RDW: 15 % (ref 11.5–15.5)
WBC: 6.7 10*3/uL (ref 4.0–10.5)

## 2014-11-05 LAB — BASIC METABOLIC PANEL
Anion gap: 4 — ABNORMAL LOW (ref 5–15)
BUN: 13 mg/dL (ref 6–23)
CALCIUM: 8.4 mg/dL (ref 8.4–10.5)
CHLORIDE: 102 mmol/L (ref 96–112)
CO2: 27 mmol/L (ref 19–32)
Creatinine, Ser: 1.66 mg/dL — ABNORMAL HIGH (ref 0.50–1.10)
GFR calc Af Amer: 37 mL/min — ABNORMAL LOW (ref >90)
GFR, EST NON AFRICAN AMERICAN: 32 mL/min — AB (ref >90)
GLUCOSE: 87 mg/dL (ref 70–99)
Potassium: 3.6 mmol/L (ref 3.5–5.1)
Sodium: 133 mmol/L — ABNORMAL LOW (ref 135–145)

## 2014-11-05 LAB — GLUCOSE, CAPILLARY
Glucose-Capillary: 138 mg/dL — ABNORMAL HIGH (ref 70–99)
Glucose-Capillary: 92 mg/dL (ref 70–99)
Glucose-Capillary: 162 mg/dL — ABNORMAL HIGH (ref 70–99)

## 2014-11-05 NOTE — Progress Notes (Signed)
[facility] PROGRESS NOTE  Bridget Martinez [id number] DOB: 07/17/1951 DOA: 10/26/2014 PCP: [name], FNP   No changes to discharge summary done on 11/04/2014.   Assessment/Plan: #1 diabetic ketoacidosis/brittle diabetes Likely secondary to non-ST elevated MI. DKA has resolved. Patient with CBGs ranging from 45-156. Hemoglobin A1c is 10.0. Continue Long acting insulin of Levemir 10 units daily. Continue meal coverage insulin. Sliding scale insulin. Follow.  #2 non-ST elevated MI Patient noted to have troponins that rose and subsequently trended back down. 2-D echo that she was depressed ejection fraction of 30-35% with akinesis of the entire inferior myocardium with grade 2 diastolic dysfunction. Patient was also noted to have a new left bundle branch block on EKG compared to a previous EKG from 09/16/2014. Patient denies any chest pain or shortness of breath. Patient has been seen by cardiology due to patient is stable without any recurrent chest pain. It was felt patient was not a candidate for cath lab due to his severe comorbidities and renal function. Continue aspirin, Plavix, Coreg, Crestor. Will likely need to follow-up with cardiology as outpatient.  #3 diabetic gastroparesis Improving on Reglan. Continue current dose of Reglan and diabetic diet. Outpatient follow-up with GI.  #4 acute renal failure Questionable etiology. Patient was seen during last hospitalization by nephrology who felt patient's acute renal failure at that time was secondary to ATN. Renal function improving daily with auto diuresis. Patient diuresing well currently off Lasix. Last CVP was 6. Follow.  #5 severe protein calorie malnutrition Continue current frequent meals. Continue nutrition supplementation.  #6 acute encephalopathy Likely secondary to problem #1 resolved.  #7 hypokalemia Replete.  #8 anemia Anemia panel consistent with anemia of chronic disease. H&H stable.  #9  hypothyroidism Continue Synthroid.  #10 prophylaxis PPI for GI prophylaxis. Lovenox for DVT prophylaxis.    Code Status: Full Family Communication: Updated patient, no family at bedside. Disposition Plan: Discharge to skilled nursing facility when bed available.   Consultants:  Cardiology: Dr. Mare Ferrari 10/30/2014  Gastroenterology: Dr. Carlean Purl 10/28/2014  Procedures:  Chest x-ray 10/26/2014  2-D echo 10/29/2014  Antibiotics:  IV vancomycin 10/26/2014>>> 10/27/2014  HPI/Subjective: Patient denies any nausea or emesis. Patient denies any chest pain or shortness of breath.  Objective: Filed Vitals:   11/05/14 0546  BP: 132/47  Pulse: 77  Temp: 98 F (36.7 C)  Resp: 18    Intake/Output Summary (Last 24 hours) at 11/05/14 1004 Last data filed at 11/05/14 0843  Gross per 24 hour  Intake    960 ml  Output   1625 ml  Net   -665 ml   Filed Weights   10/31/14 1500 11/04/14 1733 11/05/14 0546  Weight: 62.7 kg (138 lb 3.7 oz) 67.767 kg (149 lb 6.4 oz) 67.8 kg (149 lb 7.6 oz)    Exam:   General:  NAD  Cardiovascular: RRR with 3/6 SEM  Respiratory: CTAB  Abdomen: Soft/NT/ND/+BS  Musculoskeletal: No clubbing cyanosis or edema.  Data Reviewed: Basic Metabolic Panel:  Recent Labs Lab 10/30/14 0657  10/31/14 0555  11/01/14 0310 11/02/14 0510 11/03/14 0415 11/04/14 0455 11/05/14 0805  NA  --   < >  --   < > 139 137 135 134* 133*  K  --   < >  --   < > 3.4* 3.9 3.3* 4.2 3.6  CL  --   < >  --   < > 102 103 101 99 102  CO2  --   < >  --   < >  31 28 28 25 27   GLUCOSE  --   < >  --   < > 76 103* 56* 127* 87  BUN  --   < >  --   < > 16 14 13 11 13   CREATININE  --   < >  --   < > 1.89* 1.62* 1.48* 1.58* 1.66*  CALCIUM  --   < >  --   < > 8.4 8.5 8.3* 8.3* 8.4  MG 1.8  --  2.0  --   --   --   --   --   --   PHOS 2.4  --  2.4  --   --   --   --   --   --   < > = values in this interval not displayed. Liver Function Tests:  Recent Labs Lab  11/01/14 0920  AST 27  ALT 17  ALKPHOS 144*  BILITOT 0.6  PROT 5.7*  ALBUMIN 3.5   No results for input(s): LIPASE, AMYLASE in the last 168 hours. No results for input(s): AMMONIA in the last 168 hours. CBC:  Recent Labs Lab 10/30/14 0657 11/02/14 0750 11/03/14 0415 11/04/14 0455 11/04/14 1310 11/05/14 0805  WBC 5.3 6.1 7.1 7.2  --  6.7  NEUTROABS 3.1  --   --   --   --   --   HGB 8.4* 8.1* 8.4* 7.7* 8.1* 8.1*  HCT 25.1* 24.6* 25.7* 23.3* 24.4* 24.6*  MCV 89.0 89.8 90.5 90.7  --  90.4  PLT 242 192 216 193  --  232   Cardiac Enzymes:  Recent Labs Lab 10/30/14 0657  TROPONINI 2.58*   BNP (last 3 results)  Recent Labs  09/19/14 1126  BNP 298.2*    ProBNP (last 3 results)  Recent Labs  11/14/13 0405 06/15/14 1035 07/26/14 1800  PROBNP 1470.0* 650.1* 2156.0*    CBG:  Recent Labs Lab 11/04/14 0730 11/04/14 1155 11/04/14 1659 11/04/14 2105 11/05/14 0745  GLUCAP 111* 155* 90 138* 92    Recent Results (from the past 240 hour(s))  Blood culture (routine x 2)     Status: None   Collection Time: 10/26/14 12:34 PM  Result Value Ref Range Status   Specimen Description BLOOD RIGHT UPPER ARM  Final   Special Requests BOTTLES DRAWN AEROBIC AND ANAEROBIC 5CC  Final   Culture   Final    NO GROWTH 5 DAYS Performed at [facility]    Report Status 11/01/2014 FINAL  Final  Blood culture (routine x 2)     Status: None   Collection Time: 10/26/14 12:47 PM  Result Value Ref Range Status   Specimen Description BLOOD RIGHT AC  Final   Special Requests BOTTLES DRAWN AEROBIC AND ANAEROBIC 5ML  Final   Culture   Final    NO GROWTH 5 DAYS Performed at [facility]    Report Status 11/01/2014 FINAL  Final  MRSA PCR Screening     Status: None   Collection Time: 10/26/14  4:27 PM  Result Value Ref Range Status   MRSA by PCR NEGATIVE NEGATIVE Final    Comment:        The GeneXpert MRSA Assay (FDA approved for NASAL specimens only), is one  component of a comprehensive MRSA colonization surveillance program. It is not intended to diagnose MRSA infection nor to guide or monitor treatment for MRSA infections.      Studies: No results found.  Scheduled Meds: . aspirin  81 mg Oral Daily  . carvedilol  3.125 mg Oral BID WC  . clopidogrel  75 mg Oral Q breakfast  . enoxaparin (LOVENOX) injection  30 mg Subcutaneous Q24H  . escitalopram  10 mg Oral Daily  . Glycerin (Adult)  1 suppository Rectal BID  . insulin aspart  0-5 Units Subcutaneous QHS  . insulin aspart  0-9 Units Subcutaneous TID WC  . insulin aspart  3 Units Subcutaneous TID WC  . insulin detemir  10 Units Subcutaneous QHS  . levothyroxine  125 mcg Oral QAC breakfast  . metoCLOPramide  5 mg Oral TID AC & HS  . nystatin  5 mL Oral QID  . nystatin   Topical BID  . pantoprazole  40 mg Oral Daily  . protein supplement  2 oz Oral TID  . rosuvastatin  40 mg Oral Daily   Continuous Infusions: . sodium chloride 10 mL/hr at 11/01/14 1650    Principal Problem:   DKA, type 1 Active Problems:   NSTEMI (non-ST elevated myocardial infarction)   CAD, multiple vessel   DKA (diabetic ketoacidoses)   Acute on chronic systolic heart failure   DM (diabetes mellitus), type 1, uncontrolled w/neurologic complication   Acute on chronic renal failure   Encephalopathy acute   Hyperkalemia   Protein-calorie malnutrition, severe   Diabetic gastroparesis associated with type 1 diabetes mellitus   Anemia   Diabetic ketoacidosis without coma associated with diabetes mellitus due to underlying condition    Time spent: 78 mins    [facility] MD [facility] Pager [phone]. If 7PM-7AM, please contact night-coverage at [email or web address], password [facility] 11/05/2014, 10:04 AM  LOS: 10 days

## 2014-11-05 NOTE — Clinical Social Work Psych Assess (Signed)
Clinical Social Work Department CLINICAL SOCIAL WORK PLACEMENT NOTE 11/05/2014  Patient:  Bridget Martinez, Bridget Martinez  Account Number:  [id number] Admit date:  10/26/2014  Clinical Social Worker:  Loletta Specter  Date/time:  10/28/2014 02:36 PM  Clinical Social Work is seeking post-discharge placement for this patient at the following level of care:   [place]   (*CSW will update this form in Epic as items are completed)   10/28/2014  Patient/family provided with [facility] Department of Clinical Social Work's list of facilities offering this level of care within the geographic area requested by the patient (or if unable, by the patient's family).  10/28/2014  Patient/family informed of their freedom to choose among providers that offer the needed level of care, that participate in Medicare, Medicaid or managed care program needed by the patient, have an available bed and are willing to accept the patient.  10/28/2014  Patient/family informed of [facility]' ownership interest in [facility], as well as of the fact that they are under no obligation to receive care at this facility.  PASARR submitted to EDS on 10/28/2014 PASARR number received on 10/28/2014  FL2 transmitted to all facilities in geographic area requested by pt/family on  10/28/2014 FL2 transmitted to all facilities within larger geographic area on   Patient informed that his/her managed care company has contracts with or will negotiate with  certain facilities, including the following:     Patient/family informed of bed offers received:  11/04/2014 Patient chooses bed at [facility] Physician recommends and patient chooses bed at    Patient to be transferred to [facility] on  11/05/2014 Patient to be transferred to facility by husband is transporting Patient and family notified of transfer on 11/05/2014 Name of family member notified:  patient's husband,  Gwyndolyn Saxon  The following physician request were entered in Epic:   Additional Comments:  .Dede Query, [place] Worker - Weekend Coverage cell #: [phone]

## 2014-11-07 DIAGNOSIS — L02416 Cutaneous abscess of left lower limb: Secondary | ICD-10-CM | POA: Diagnosis not present

## 2014-11-07 DIAGNOSIS — E1065 Type 1 diabetes mellitus with hyperglycemia: Secondary | ICD-10-CM | POA: Diagnosis not present

## 2014-11-07 DIAGNOSIS — E1043 Type 1 diabetes mellitus with diabetic autonomic (poly)neuropathy: Secondary | ICD-10-CM | POA: Diagnosis not present

## 2014-11-07 DIAGNOSIS — I5042 Chronic combined systolic (congestive) and diastolic (congestive) heart failure: Secondary | ICD-10-CM | POA: Diagnosis not present

## 2014-11-15 ENCOUNTER — Encounter (HOSPITAL_COMMUNITY): Payer: Self-pay

## 2014-11-15 ENCOUNTER — Ambulatory Visit (HOSPITAL_COMMUNITY)
Admit: 2014-11-15 | Discharge: 2014-11-15 | Disposition: A | Discharge status: Home / Self Care | Payer: BC Managed Care – PPO | Source: Ambulatory Visit | Attending: Cardiology | Admitting: Cardiology

## 2014-11-15 VITALS — BP 118/62 | HR 75 | Wt 138.5 lb

## 2014-11-15 DIAGNOSIS — Z7901 Long term (current) use of anticoagulants: Secondary | ICD-10-CM | POA: Insufficient documentation

## 2014-11-15 DIAGNOSIS — Z7982 Long term (current) use of aspirin: Secondary | ICD-10-CM | POA: Insufficient documentation

## 2014-11-15 DIAGNOSIS — N183 Chronic kidney disease, stage 3 unspecified: Secondary | ICD-10-CM

## 2014-11-15 DIAGNOSIS — I252 Old myocardial infarction: Secondary | ICD-10-CM | POA: Insufficient documentation

## 2014-11-15 DIAGNOSIS — M810 Age-related osteoporosis without current pathological fracture: Secondary | ICD-10-CM | POA: Insufficient documentation

## 2014-11-15 DIAGNOSIS — K449 Diaphragmatic hernia without obstruction or gangrene: Secondary | ICD-10-CM | POA: Insufficient documentation

## 2014-11-15 DIAGNOSIS — I10 Essential (primary) hypertension: Secondary | ICD-10-CM | POA: Insufficient documentation

## 2014-11-15 DIAGNOSIS — E785 Hyperlipidemia, unspecified: Secondary | ICD-10-CM | POA: Insufficient documentation

## 2014-11-15 DIAGNOSIS — Z951 Presence of aortocoronary bypass graft: Secondary | ICD-10-CM | POA: Diagnosis not present

## 2014-11-15 DIAGNOSIS — N189 Chronic kidney disease, unspecified: Secondary | ICD-10-CM | POA: Insufficient documentation

## 2014-11-15 DIAGNOSIS — E039 Hypothyroidism, unspecified: Secondary | ICD-10-CM | POA: Insufficient documentation

## 2014-11-15 DIAGNOSIS — I2510 Atherosclerotic heart disease of native coronary artery without angina pectoris: Secondary | ICD-10-CM

## 2014-11-15 DIAGNOSIS — K219 Gastro-esophageal reflux disease without esophagitis: Secondary | ICD-10-CM | POA: Insufficient documentation

## 2014-11-15 DIAGNOSIS — I5022 Chronic systolic (congestive) heart failure: Secondary | ICD-10-CM | POA: Diagnosis not present

## 2014-11-15 DIAGNOSIS — I129 Hypertensive chronic kidney disease with stage 1 through stage 4 chronic kidney disease, or unspecified chronic kidney disease: Secondary | ICD-10-CM | POA: Diagnosis not present

## 2014-11-15 DIAGNOSIS — Z79899 Other long term (current) drug therapy: Secondary | ICD-10-CM | POA: Diagnosis not present

## 2014-11-15 DIAGNOSIS — Z794 Long term (current) use of insulin: Secondary | ICD-10-CM | POA: Insufficient documentation

## 2014-11-15 DIAGNOSIS — E1165 Type 2 diabetes mellitus with hyperglycemia: Secondary | ICD-10-CM | POA: Insufficient documentation

## 2014-11-15 LAB — BASIC METABOLIC PANEL
Anion gap: 8 (ref 5–15)
BUN: 12 mg/dL (ref 6–23)
CHLORIDE: 102 mmol/L (ref 96–112)
CO2: 25 mmol/L (ref 19–32)
Calcium: 9.4 mg/dL (ref 8.4–10.5)
Creatinine, Ser: 1.46 mg/dL — ABNORMAL HIGH (ref 0.50–1.10)
GFR calc non Af Amer: 37 mL/min — ABNORMAL LOW (ref >90)
GFR, EST AFRICAN AMERICAN: 43 mL/min — AB (ref >90)
Glucose, Bld: 81 mg/dL (ref 70–99)
Potassium: 3.3 mmol/L — ABNORMAL LOW (ref 3.5–5.1)
Sodium: 135 mmol/L (ref 135–145)

## 2014-11-15 LAB — BRAIN NATRIURETIC PEPTIDE: B NATRIURETIC PEPTIDE 5: 423.4 pg/mL — AB (ref 0.0–100.0)

## 2014-11-15 MED ORDER — CARVEDILOL 6.25 MG PO TABS: 6.2500 mg | ORAL_TABLET | Freq: Two times a day (BID) | ORAL | Status: DC

## 2014-11-15 NOTE — Patient Instructions (Signed)
Increase Carvedilol to 6.25 mg Twice daily   Labs today  Your physician recommends that you schedule a follow-up appointment in: 6 weeks

## 2014-11-15 NOTE — Progress Notes (Signed)
Patient ID: Bridget Martinez, female   DOB: Jan 28, 1951, 64 y.o.   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   Admitted to the hospital 08/24/13 for altered mental status and was found to be severely acidotic and was intubated. Glucose on admission >700, temp 91.8 and BP 72/37. Blood cultures one out of two show coagulase-negative staph, urine cultures showed bacillus species  Follow up for Heart Failure: Since the last visit she has had multiple admits for gastroparesis and hyperglycemia. Last admit in 2/16 she was at [facility] with DKA, n/v, gastroparesis, and NSTEMI. Evaluated by Dr Burt Knack and not felt to be cath candidate.  She as noted to have intermittent LBBB.  Not sure about weight. She was discharged to rehab. Mild dyspnea with walking and fatigue. Walks slowly.   ECHOs 11/21/11 EF 35-40% 07/30/12  EF  45-50% Severe hypokinesis of inferior posterior wall 09/21/13 EF 35-40% with grade II DD  10/29/2014 EF 30-35% Grade II DD, mild AI, PA systolic pressure 45 mmHg.   Labs 01/2013: K+ 4.0, Creatinine 1.31, BUN 35        08/2013: K+ 4.4, Cr 0.81        11/30/13: K+ 5.4, glucose 541, creatinine 0.91        11/05/2014: K 3.6 Creatinine 1.66     ROS: All systems negative except as listed in HPI, PMH and Problem List.  Past Medical History  Diagnosis Date  . Diabetes mellitus     on insulin, with h/o DKA   . HTN (hypertension)   . Hyperlipidemia   . Venous stasis ulcers   . Arthritis   . Depression     anxiety  . 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 Hypertension   . Coronary artery disease     angina.  MI.   . Pneumonia        . GERD (gastroesophageal reflux disease)     Hiatal hernia.   . Hypothyroidism   . CHF (congestive heart failure)   . Tuberculosis   . Osteoporosis     Current Outpatient  Prescriptions  Medication Sig Dispense Refill  . aspirin 81 MG EC tablet TAKE 1 TABLET (81 MG TOTAL) BY MOUTH DAILY. 30 tablet 3  . carvedilol (COREG) 3.125 MG tablet Take 1 tablet (3.125 mg total) by mouth 2 (two) times daily with a meal. 62 tablet 0  . clopidogrel (PLAVIX) 75 MG tablet Take 1 tablet (75 mg total) by mouth daily with breakfast. 30 tablet 2  . enoxaparin (LOVENOX) 30 MG/0.3ML injection Inject 0.3 mLs (30 mg total) into the skin daily. 10 mL 0  . escitalopram (LEXAPRO) 10 MG tablet Take 1 tablet (10 mg total) by mouth daily. 30 tablet 11  . Glycerin, Adult, 2.1 G SUPP Place 1 suppository rectally 2 (two) times daily. 60 suppository 0  . insulin aspart (NOVOLOG) 100 UNIT/ML injection Inject 8 Units into the skin 3 (three) times daily before meals.    . insulin detemir (LEVEMIR) 100 UNIT/ML injection Inject 0.1 mLs (10 Units total) into the skin daily. 10 mL 0  . levothyroxine (SYNTHROID, LEVOTHROID) 125 MCG tablet Take 1 tablet (125 mcg total) by mouth daily. 30 tablet 11  . metoCLOPramide (REGLAN) 5 MG/5ML solution Take 5 mLs (5 mg total) by mouth 4 (four)  times daily -  before meals and at bedtime. 600 mL 0  . nystatin (MYCOSTATIN/NYSTOP) 100000 UNIT/GM POWD Apply to groin area 2 times daily. 60 g 0  . ondansetron (ZOFRAN ODT) 8 MG disintegrating tablet Take 1 tablet (8 mg total) by mouth every 8 (eight) hours as needed for nausea or vomiting. 20 tablet 1  . pantoprazole (PROTONIX) 40 MG tablet Take 1 tablet (40 mg total) by mouth daily. 30 tablet 3  . protein supplement (UNJURY CHICKEN SOUP) POWD Take 7 g (2 oz total) by mouth 3 (three) times daily.    . rosuvastatin (CRESTOR) 40 MG tablet Take 1 tablet (40 mg total) by mouth daily. 30 tablet 11  . Vitamin D, Ergocalciferol, (DRISDOL) 50000 UNITS CAPS capsule Take 1 capsule by mouth every Saturday.      No current facility-administered medications for this encounter.    Filed Vitals:   11/15/14 1433  BP: 118/62  Pulse: 75   Weight: 138 lb 8 oz (62.823 kg)  SpO2: 99%    PHYSICAL EXAM:  General:  Well appearing. No resp difficulty HEENT: normal Neck: supple.  No JVD.  Carotids 2+ bilaterally; no bruits. No lymphadenopathy or thryomegaly appreciated. Cor: PMI normal. Regular rate & rhythm. No rubs, gallops or murmurs. Lungs: clear Abdomen: soft, nontender, nondistended. No hepatosplenomegaly. No bruits or masses. Good bowel sounds. Extremities: no cyanosis, clubbing, rash, TED hose intact Neuro: alert & orientedx3, cranial nerves grossly intact. Moves all 4 extremities w/o difficulty. Affect pleasant.  ASSESSMENT & PLAN:   1) Chronic systolic HF: Ischemic cardiomyopathy.  Echo (2/16) with EF 30-35% RV normal.  NYHA II symptoms and volume status stable. Has been off lasix for several months.  - Increase  carvedilol 6.25 mg twice a day.  - Not on ACEI or spironolactone with elevated creatinine.  - Reinforced the need and importance of daily weights, a low sodium diet, and fluid restriction (less than 2 L a day). Instructed to call the HF clinic if weight increases more than 3 lbs overnight or 5 lbs in a week.  2) HTN: Stable today. Continue current regimen.   3) HLD:-Managed by [facility]. Continue statin 4) CAD: s/p CABG x 4. No chest pain.  Continue plavix, ASA, and statin.  5) DM2: Uncontrolled. Hgb A1C 10. Has had multiple admits with hyperglycemia and hypoglycemia. At SNF. She is followed by Dr Buddy Duty.   6) CKD: Check BMET today.   F/U 6 weeks  CLEGG,AMY NP-C 2:30 PM   Patient seen with NP, agree with the above note.  Stable at rehab currently.  Increase Coreg to 6.25 mg bid.  BMET today.  Will plan for echo eventually to reassess EF on medical treatment.  Eventually may need ICD but will need better blood glucose control.    Loralie Champagne 11/16/2014

## 2014-11-16 ENCOUNTER — Telehealth (HOSPITAL_COMMUNITY): Payer: Self-pay | Admitting: Vascular Surgery

## 2014-11-16 NOTE — Telephone Encounter (Signed)
Tammy from [facility] called the attending doctor would like to talk to [facility] about the pt... Dr. Eula Fried called # [phone]

## 2014-11-16 NOTE — Telephone Encounter (Signed)
Will forward to MD for review

## 2014-11-18 ENCOUNTER — Other Ambulatory Visit (HOSPITAL_COMMUNITY): Payer: Self-pay

## 2014-11-18 MED ORDER — CARVEDILOL 3.125 MG PO TABS: 3.1250 mg | ORAL_TABLET | Freq: Two times a day (BID) | ORAL | Status: DC

## 2014-11-18 MED ORDER — POTASSIUM CHLORIDE CRYS ER 20 MEQ PO TBCR: 20.0000 meq | EXTENDED_RELEASE_TABLET | Freq: Every day | ORAL | Status: DC

## 2014-11-19 ENCOUNTER — Other Ambulatory Visit (HOSPITAL_COMMUNITY): Payer: Self-pay

## 2014-11-19 ENCOUNTER — Emergency Department (HOSPITAL_COMMUNITY): Payer: BC Managed Care – PPO

## 2014-11-19 ENCOUNTER — Encounter (HOSPITAL_COMMUNITY): Payer: Self-pay | Admitting: Nurse Practitioner

## 2014-11-19 ENCOUNTER — Inpatient Hospital Stay (HOSPITAL_COMMUNITY)
Admission: EM | Admit: 2014-11-19 | Discharge: 2014-11-28 | DRG: 469 | Disposition: A | Discharge status: Skilled Nursing Facility | Payer: BC Managed Care – PPO | Attending: Internal Medicine | Admitting: Internal Medicine

## 2014-11-19 DIAGNOSIS — D638 Anemia in other chronic diseases classified elsewhere: Secondary | ICD-10-CM | POA: Diagnosis present

## 2014-11-19 DIAGNOSIS — I152 Hypertension secondary to endocrine disorders: Secondary | ICD-10-CM | POA: Diagnosis present

## 2014-11-19 DIAGNOSIS — K3184 Gastroparesis: Secondary | ICD-10-CM | POA: Diagnosis present

## 2014-11-19 DIAGNOSIS — E785 Hyperlipidemia, unspecified: Secondary | ICD-10-CM | POA: Diagnosis present

## 2014-11-19 DIAGNOSIS — E43 Unspecified severe protein-calorie malnutrition: Secondary | ICD-10-CM | POA: Diagnosis present

## 2014-11-19 DIAGNOSIS — D649 Anemia, unspecified: Secondary | ICD-10-CM | POA: Diagnosis present

## 2014-11-19 DIAGNOSIS — E1143 Type 2 diabetes mellitus with diabetic autonomic (poly)neuropathy: Secondary | ICD-10-CM | POA: Diagnosis present

## 2014-11-19 DIAGNOSIS — R410 Disorientation, unspecified: Secondary | ICD-10-CM

## 2014-11-19 DIAGNOSIS — E039 Hypothyroidism, unspecified: Secondary | ICD-10-CM | POA: Diagnosis present

## 2014-11-19 DIAGNOSIS — S72002A Fracture of unspecified part of neck of left femur, initial encounter for closed fracture: Principal | ICD-10-CM

## 2014-11-19 DIAGNOSIS — I5042 Chronic combined systolic (congestive) and diastolic (congestive) heart failure: Secondary | ICD-10-CM | POA: Diagnosis present

## 2014-11-19 DIAGNOSIS — F339 Major depressive disorder, recurrent, unspecified: Secondary | ICD-10-CM | POA: Diagnosis present

## 2014-11-19 DIAGNOSIS — E1041 Type 1 diabetes mellitus with diabetic mononeuropathy: Secondary | ICD-10-CM

## 2014-11-19 DIAGNOSIS — Z96642 Presence of left artificial hip joint: Secondary | ICD-10-CM

## 2014-11-19 DIAGNOSIS — D509 Iron deficiency anemia, unspecified: Secondary | ICD-10-CM | POA: Diagnosis not present

## 2014-11-19 DIAGNOSIS — F419 Anxiety disorder, unspecified: Secondary | ICD-10-CM | POA: Diagnosis present

## 2014-11-19 DIAGNOSIS — I129 Hypertensive chronic kidney disease with stage 1 through stage 4 chronic kidney disease, or unspecified chronic kidney disease: Secondary | ICD-10-CM | POA: Diagnosis present

## 2014-11-19 DIAGNOSIS — K219 Gastro-esophageal reflux disease without esophagitis: Secondary | ICD-10-CM | POA: Diagnosis present

## 2014-11-19 DIAGNOSIS — I10 Essential (primary) hypertension: Secondary | ICD-10-CM

## 2014-11-19 DIAGNOSIS — I509 Heart failure, unspecified: Secondary | ICD-10-CM

## 2014-11-19 DIAGNOSIS — Z7902 Long term (current) use of antithrombotics/antiplatelets: Secondary | ICD-10-CM

## 2014-11-19 DIAGNOSIS — E1043 Type 1 diabetes mellitus with diabetic autonomic (poly)neuropathy: Secondary | ICD-10-CM | POA: Diagnosis present

## 2014-11-19 DIAGNOSIS — M810 Age-related osteoporosis without current pathological fracture: Secondary | ICD-10-CM | POA: Diagnosis present

## 2014-11-19 DIAGNOSIS — E162 Hypoglycemia, unspecified: Secondary | ICD-10-CM | POA: Diagnosis present

## 2014-11-19 DIAGNOSIS — F329 Major depressive disorder, single episode, unspecified: Secondary | ICD-10-CM | POA: Diagnosis present

## 2014-11-19 DIAGNOSIS — I2510 Atherosclerotic heart disease of native coronary artery without angina pectoris: Secondary | ICD-10-CM | POA: Diagnosis present

## 2014-11-19 DIAGNOSIS — D500 Iron deficiency anemia secondary to blood loss (chronic): Secondary | ICD-10-CM | POA: Diagnosis present

## 2014-11-19 DIAGNOSIS — E876 Hypokalemia: Secondary | ICD-10-CM | POA: Diagnosis not present

## 2014-11-19 DIAGNOSIS — E1049 Type 1 diabetes mellitus with other diabetic neurological complication: Secondary | ICD-10-CM | POA: Diagnosis present

## 2014-11-19 DIAGNOSIS — IMO0002 Reserved for concepts with insufficient information to code with codable children: Secondary | ICD-10-CM | POA: Diagnosis present

## 2014-11-19 DIAGNOSIS — R627 Adult failure to thrive: Secondary | ICD-10-CM | POA: Diagnosis present

## 2014-11-19 DIAGNOSIS — E1159 Type 2 diabetes mellitus with other circulatory complications: Secondary | ICD-10-CM | POA: Diagnosis present

## 2014-11-19 DIAGNOSIS — E1065 Type 1 diabetes mellitus with hyperglycemia: Secondary | ICD-10-CM | POA: Diagnosis present

## 2014-11-19 DIAGNOSIS — Z79899 Other long term (current) drug therapy: Secondary | ICD-10-CM | POA: Diagnosis not present

## 2014-11-19 DIAGNOSIS — Z833 Family history of diabetes mellitus: Secondary | ICD-10-CM

## 2014-11-19 DIAGNOSIS — Z7982 Long term (current) use of aspirin: Secondary | ICD-10-CM

## 2014-11-19 DIAGNOSIS — Z419 Encounter for procedure for purposes other than remedying health state, unspecified: Secondary | ICD-10-CM

## 2014-11-19 DIAGNOSIS — E871 Hypo-osmolality and hyponatremia: Secondary | ICD-10-CM | POA: Diagnosis present

## 2014-11-19 DIAGNOSIS — N183 Chronic kidney disease, stage 3 (moderate): Secondary | ICD-10-CM

## 2014-11-19 DIAGNOSIS — I252 Old myocardial infarction: Secondary | ICD-10-CM

## 2014-11-19 DIAGNOSIS — Z794 Long term (current) use of insulin: Secondary | ICD-10-CM

## 2014-11-19 DIAGNOSIS — W19XXXA Unspecified fall, initial encounter: Secondary | ICD-10-CM

## 2014-11-19 DIAGNOSIS — E119 Type 2 diabetes mellitus without complications: Secondary | ICD-10-CM | POA: Insufficient documentation

## 2014-11-19 DIAGNOSIS — Z951 Presence of aortocoronary bypass graft: Secondary | ICD-10-CM

## 2014-11-19 DIAGNOSIS — N184 Chronic kidney disease, stage 4 (severe): Secondary | ICD-10-CM | POA: Diagnosis present

## 2014-11-19 HISTORY — DX: Fracture of unspecified part of neck of left femur, initial encounter for closed fracture: S72.002A

## 2014-11-19 LAB — I-STAT CHEM 8, ED
BUN: 14 mg/dL (ref 6–23)
CHLORIDE: 100 mmol/L (ref 96–112)
Calcium, Ion: 1.16 mmol/L (ref 1.13–1.30)
Creatinine, Ser: 1.3 mg/dL — ABNORMAL HIGH (ref 0.50–1.10)
Glucose, Bld: 151 mg/dL — ABNORMAL HIGH (ref 70–99)
HCT: 29 % — ABNORMAL LOW (ref 36.0–46.0)
Hemoglobin: 9.9 g/dL — ABNORMAL LOW (ref 12.0–15.0)
Potassium: 2.9 mmol/L — ABNORMAL LOW (ref 3.5–5.1)
Sodium: 136 mmol/L (ref 135–145)
TCO2: 20 mmol/L (ref 0–100)

## 2014-11-19 LAB — I-STAT TROPONIN, ED: Troponin i, poc: 0.03 ng/mL (ref 0.00–0.08)

## 2014-11-19 LAB — CBG MONITORING, ED: Glucose-Capillary: 119 mg/dL — ABNORMAL HIGH (ref 70–99)

## 2014-11-19 MED ORDER — DIPHENHYDRAMINE HCL 50 MG/ML IJ SOLN: 12.5000 mg | Freq: Once | INTRAMUSCULAR | Status: DC

## 2014-11-19 MED ORDER — POTASSIUM CHLORIDE 10 MEQ/100ML IV SOLN
10.0000 meq | INTRAVENOUS | Status: AC
  Administered 2014-11-20: 10 meq via INTRAVENOUS
  Filled 2014-11-19: qty 100

## 2014-11-19 MED ORDER — FENTANYL CITRATE 0.05 MG/ML IJ SOLN: 100.0000 ug | Freq: Once | INTRAMUSCULAR | Status: DC

## 2014-11-19 MED ORDER — POTASSIUM CHLORIDE 20 MEQ/15ML (10%) PO SOLN
40.0000 meq | Freq: Once | ORAL | Status: AC
  Administered 2014-11-20: 40 meq via ORAL
  Filled 2014-11-19: qty 30

## 2014-11-19 NOTE — ED Notes (Signed)
Bed: [id number] Expected date:  Expected time:  Means of arrival:  Comments: EMS 64yo F fall, laceration to hand

## 2014-11-19 NOTE — H&P (Addendum)
[facility] History and Physical  Bridget Martinez [id number] DOB: [birth year] DOA: 11/19/2014  Referring physician: ED physician PCP: [name], FNP  Specialists:   Chief Complaint: left hip pain after fall  HPI: Bridget Martinez is a 64 y.o. 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  Patient reports that she had fall when she was working in kitchen at home at about 7:30 PM. She did not pass out, no chest pain or palpitation before the event. It was most likely a mechanical fall per her husband. She had a small amount of bleeding from a small scalp laceration of her head. She developed moderate pain over left hip. She could not stand on left leg without pain. No neck pain. Patient denies fever, chills, headaches, cough, chest pain, SOB, abdominal pain, diarrhea, constipation, dysuria, urgency, frequency, hematuria, skin rashes or leg swelling. No unilateral weakness, numbness or tingling sensations. No vision change or hearing loss.  In ED, patient was found to have mildly displaced transcervical fracture through the left femoral neck by X-ray. CT-head is negative for acute abnormalities. Hypokalemia, negative troponin, stable hemoglobin, stable renal function. Patient is admitted to inpatient for further evaluation and treatment. Orthopedic surgery was consulted by ED.  Review of Systems: As presented in the history of presenting illness, rest negative.  Where does patient live?  At home Can patient participate in ADLs? little Allergy:  Allergies  Allergen Reactions  . Sulfa Antibiotics Anaphylaxis and Swelling    Stiff neck also     Past Medical History  Diagnosis Date  . Diabetes mellitus     on insulin, with h/o  DKA   . HTN (hypertension)   . Hyperlipidemia   . Venous stasis ulcers   . Arthritis   . Depression     anxiety  . 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 Hypertension   . Coronary artery disease     angina.  MI.   . Pneumonia        . GERD (gastroesophageal reflux disease)     Hiatal hernia.   . Hypothyroidism   . CHF (congestive heart failure)   . Tuberculosis   . Osteoporosis     Past Surgical History  Procedure Laterality Date  . Cardiac catheterization    . Tonsillectomy    . Coronary artery bypass graft      Dr. Prescott Gum in 09/2011   . Coronary artery bypass graft  2012  . Esophagogastroduodenoscopy N/A 11/17/2013    Procedure: ESOPHAGOGASTRODUODENOSCOPY (EGD);  Surgeon: Lafayette Dragon, MD;  Location: [facility] ENDOSCOPY;  Service: Endoscopy;  Laterality: N/A;  . Colonoscopy N/A 11/18/2013    Procedure: COLONOSCOPY;  Surgeon: Lafayette Dragon, MD;  Location: [facility] ENDOSCOPY;  Service: Endoscopy;  Laterality: N/A;  . Right heart catheterization N/A 07/29/2011    Procedure: RIGHT HEART CATH;  Surgeon: Minus Breeding, MD;  Location: [facility] CATH LAB;  Service: Cardiovascular;  Laterality: N/A;  . Left heart catheterization with coronary angiogram N/A 07/26/2011    Procedure: LEFT HEART CATHETERIZATION WITH CORONARY ANGIOGRAM;  Surgeon: Larey Dresser, MD;  Location: [facility] CATH LAB;  Service: Cardiovascular;  Laterality: N/A;    Social History:  reports that she has never smoked. She does not have any  smokeless tobacco history on file. She reports that she does not drink alcohol or use illicit drugs.  Family History:  Family History  Problem Relation Age of Onset  . Heart disease Father   . Multiple sclerosis Father   . Hypertension Mother   . Hyperthyroidism Mother   . Diabetes Cousin     Multiple maternal cousins with type 2 diabetes mellitus  . Diabetes Maternal Uncle     Type 1 diabetes mellitus  . Heart attack Paternal Grandfather   .  Heart disease Paternal Grandfather      Prior to Admission medications   Medication Sig Start Date End Date Taking? Authorizing Provider  aspirin EC 325 MG tablet Take 325 mg by mouth daily.   Yes Historical Provider, MD  carvedilol (COREG) 3.125 MG tablet Take 1 tablet (3.125 mg total) by mouth 2 (two) times daily with a meal. 11/18/14  Yes Larey Dresser, MD  escitalopram (LEXAPRO) 10 MG tablet Take 1 tablet (10 mg total) by mouth daily. 09/27/13  Yes Lysbeth Penner, FNP  Glycerin, Adult, 2.1 G SUPP Place 1 suppository rectally 2 (two) times daily. Patient taking differently: Place 1 suppository rectally daily as needed for moderate constipation.  11/04/14  Yes Eugenie Filler, MD  insulin aspart (NOVOLOG) 100 UNIT/ML injection Inject 8 Units into the skin 3 (three) times daily before meals.   Yes Historical Provider, MD  insulin detemir (LEVEMIR) 100 UNIT/ML injection Inject 0.1 mLs (10 Units total) into the skin daily. 11/04/14  Yes Eugenie Filler, MD  levothyroxine (SYNTHROID, LEVOTHROID) 125 MCG tablet Take 1 tablet (125 mcg total) by mouth daily. 09/27/13  Yes Lysbeth Penner, FNP  metoCLOPramide (REGLAN) 5 MG/5ML solution Take 5 mLs (5 mg total) by mouth 4 (four) times daily -  before meals and at bedtime. 11/04/14  Yes Eugenie Filler, MD  ondansetron (ZOFRAN ODT) 8 MG disintegrating tablet Take 1 tablet (8 mg total) by mouth every 8 (eight) hours as needed for nausea or vomiting. 10/09/14  Yes Debbe Odea, MD  pantoprazole (PROTONIX) 40 MG tablet Take 1 tablet (40 mg total) by mouth daily. 07/31/14 03/30/17 Yes Ripudeep Krystal Eaton, MD  rosuvastatin (CRESTOR) 40 MG tablet Take 1 tablet (40 mg total) by mouth daily. 09/27/13  Yes Lysbeth Penner, FNP  spironolactone (ALDACTONE) 25 MG tablet Take 25 mg by mouth daily.  10/18/14  Yes Historical Provider, MD  Vitamin D, Ergocalciferol, (DRISDOL) 50000 UNITS CAPS capsule Take 50,000 Units by mouth every Saturday.  09/29/13  Yes Historical Provider,  MD  aspirin 81 MG EC tablet TAKE 1 TABLET (81 MG TOTAL) BY MOUTH DAILY. Patient not taking: Reported on 11/19/2014 11/07/14   Jolaine Artist, MD  clopidogrel (PLAVIX) 75 MG tablet Take 1 tablet (75 mg total) by mouth daily with breakfast. Patient not taking: Reported on 11/19/2014 01/12/14   Lysbeth Penner, FNP  enoxaparin (LOVENOX) 30 MG/0.3ML injection Inject 0.3 mLs (30 mg total) into the skin daily. Patient not taking: Reported on 11/19/2014 11/04/14   Eugenie Filler, MD  nystatin (MYCOSTATIN/NYSTOP) 100000 UNIT/GM POWD Apply to groin area 2 times daily. Patient not taking: Reported on 11/19/2014 11/04/14   Eugenie Filler, MD  potassium chloride SA (KLOR-CON M20) 20 MEQ tablet Take 1 tablet (20 mEq total) by mouth daily. Patient not taking: Reported on 11/19/2014 11/18/14   Larey Dresser, MD  protein supplement [facility] CHICKEN SOUP) POWD Take 7 g (2 oz total) by mouth 3 (  three) times daily. Patient not taking: Reported on 11/19/2014 11/04/14   Eugenie Filler, MD    Physical Exam: Filed Vitals:   11/19/14 2330 11/19/14 2345 11/20/14 0000 11/20/14 0015  BP: 120/42  119/47   Pulse: 95 94 85 90  Temp:      TempSrc:      Resp: 13 16 15 14   SpO2: 98% 98% 97% 97%   General: Not in acute distress HEENT:       Eyes: PERRL, EOMI, no scleral icterus       ENT: No discharge from the ears and nose, no pharynx injection, no tonsillar enlargement.        Neck: No JVD, no bruit, no mass felt. Cardiac: S1/S2, RRR, No murmurs, No gallops or rubs Pulm: Good air movement bilaterally. Clear to auscultation bilaterally. No rales, wheezing, rhonchi or rubs. Abd: Soft, nondistended, nontender, no rebound pain, no organomegaly, BS present Ext: No edema bilaterally. 2+DP/PT pulse bilaterally Musculoskeletal: tenderness and bony tenderness over left hip.  Skin: There is small scalp laceration, approximately 0.5 cm. No active bleeding. Neuro: Alert and oriented X3, cranial nerves II-XII grossly  intact, muscle strength 5/5 in all extremeties, sensation to light touch intact.  Psych: Patient is not psychotic, no suicidal or hemocidal ideation.  Labs on Admission:  Basic Metabolic Panel:  Recent Labs Lab 11/15/14 1530 11/19/14 2306  NA 135 136  K 3.3* 2.9*  CL 102 100  CO2 25  --   GLUCOSE 81 151*  BUN 12 14  CREATININE 1.46* 1.30*  CALCIUM 9.4  --    Liver Function Tests: No results for input(s): AST, ALT, ALKPHOS, BILITOT, PROT, ALBUMIN in the last 168 hours. No results for input(s): LIPASE, AMYLASE in the last 168 hours. No results for input(s): AMMONIA in the last 168 hours. CBC:  Recent Labs Lab 11/19/14 2306  HGB 9.9*  HCT 29.0*   Cardiac Enzymes: No results for input(s): CKTOTAL, CKMB, CKMBINDEX, TROPONINI in the last 168 hours.  BNP (last 3 results)  Recent Labs  09/19/14 1126 11/15/14 1500  BNP 298.2* 423.4*    ProBNP (last 3 results)  Recent Labs  06/15/14 1035 07/26/14 1800  PROBNP 650.1* 2156.0*    CBG:  Recent Labs Lab 11/19/14 2157  GLUCAP 119*    Radiological Exams on Admission: Ct Head Wo Contrast  11/19/2014   CLINICAL DATA:  Head injury after fall. Possible loss of consciousness.  EXAM: CT HEAD WITHOUT CONTRAST  TECHNIQUE: Contiguous axial images were obtained from the base of the skull through the vertex without intravenous contrast.  COMPARISON:  07/26/2014  FINDINGS: No intracranial hemorrhage, mass effect, or midline shift. No hydrocephalus. The basilar cisterns are patent. No evidence of territorial infarct. No intracranial fluid collection. Mild generalized atrophy. Calvarium is intact. Unchanged chronic calcification adjacent to the inner table of left frontal skull. Included paranasal sinuses and mastoid air cells are well aerated.  IMPRESSION: No acute intracranial abnormality.   Electronically Signed   By: Jeb Levering M.D.   On: 11/19/2014 22:49   Dg Hips Bilat With Pelvis Min 5 Views  11/19/2014   CLINICAL DATA:   Status post fall, with bilateral hip pain, and bruising about the hips and groin. Initial encounter.  EXAM: BILATERAL HIP (WITH PELVIS) 5-6 VIEWS  COMPARISON:  None.  FINDINGS: There is a mildly displaced transcervical fracture through the left femoral neck, with superior displacement of the distal femur and mild rotation of the left femoral head. No additional  fractures are seen. Both femoral heads remain seated within their respective acetabuli. The right hip joint is grossly unremarkable in appearance.  Mild sclerotic change is seen at the sacroiliac joints. Clips are noted overlying the right inguinal region. Scattered vascular calcifications are seen. The visualized bowel gas pattern is grossly unremarkable.  IMPRESSION: 1. Mildly displaced transcervical fracture through the left femoral neck. 2. Scattered vascular calcifications seen.   Electronically Signed   By: Garald Balding M.D.   On: 11/19/2014 22:54    EKG: Independently reviewed. Prolongation of QTc interval 506, occasional PVC  Assessment/Plan Principal Problem:   Left displaced femoral neck fracture Active Problems:   Hyperlipidemia   Depression   CAD, multiple vessel   Hypertension   CKD (chronic kidney disease), stage III   Diabetic gastroparesis   DM (diabetes mellitus), type 1, uncontrolled w/neurologic complication   Chronic combined systolic and diastolic congestive heart failure   Hypothyroidism   Protein-calorie malnutrition, severe   Fall   Closed left hip fracture  Left femoral neck fracture: As evidenced by x-ray. Patient has moderate pain now. No neurovascular compromise. Orthopedic surgeon was consulted. Dr.  Marland Kitchen will admit to tele bed given hx of CHF - Pain control: morphine prn for severe pain and percocet for moderate pain - follow up ortho recs - NPO after MN - type and cross - INR/PTT  Diabetes mellitus-I: Patient has a history of DKA. Poorly controlled diabetes. Recent A1c was 10.0 on 10/26/14.  Currently patient is levemir at home. Her AG is slightly elevated at 16 on admission, but bicarbonate is 20, renal function stable. -Decreased Levemir dose from 10 to 7 units daily when NPO -SSI -Gentle IV fluid: Normal saline 75 mL per hour (EF 30-35%, with grade 2 diastolic dysfunction).  Diabetic gastroparesis: -patient's QTc=506. Will hold Zofran and continue Reglan.  -will repeat EKG in AM, if still QTc prolongation getting worse, may need to discontinue Reglan too. -start Hydroxyzine for nausea  CKD-III: Baseline creatinine 1.4-1.6. Her creatinine is 1.30. Renal function stable. -Follow-up renal function BMP  Combined systolic and diastolic Congestive heart failure: 2-D echo on 10/29/14 showed EF 30-35% with grade 2 diastolic dysfunction. It is compensated.  -Continue aspirin and spironolactone -Continue Coreg  Coronary artery disease: Post status of CABG in 2013. Patient does not have chest pain. -Continue aspirin, Coreg, Crestor  GERD: -Protonix  Hyperlipidemia: LDL was at 172 on 09/27/13 -Continue Crestor  Severe protein calorie malnutrition - nutrition supplementation.  Hypokalemia: Potassium 2.9 -Repleted. -Check magnesium level  Anemia: previous anemia panel consistent with anemia of chronic disease. H&H remained stable. -follow CBC  Hypothyroidism: TSH was 2.7 on 07/26/14 -Continued on home regimen of Synthroid.   DVT ppx: SQ Heparin       Code Status: Full code Family Communication:   Yes, patient's       at bed side Disposition Plan: Admit to inpatient   Date of Service 11/20/2014    Ivor Costa [facility] Pager [phone]  If 7PM-7AM, please contact night-coverage [email or web address] Password [facility] 11/20/2014, 12:19 AM

## 2014-11-19 NOTE — ED Notes (Signed)
Pt presents from home via EMS, report of head injury secondary to a fall, reports possible LOC, denies being on anticoagulants, initial inspection of the head reveals scant bleeding on th occipital area, denies neck pain or back pain, AOx4, denies shortness of breath, rates pain 2/10.

## 2014-11-19 NOTE — ED Provider Notes (Addendum)
CSN: [id number]     Arrival date & time 11/19/14  2141 History   First MD Initiated Contact with Patient 11/19/14 2145     Chief Complaint  Patient presents with  . Head Injury  . Fall      HPI Pt presents from home via EMS, report of head injury secondary to a fall, reports possible LOC, denies being on anticoagulants, initial inspection of the head reveals scant bleeding on th occipital area, denies neck pain or back pain, AOx4, denies shortness of breath, rates pain 2/10. Past Medical History  Diagnosis Date  . Diabetes mellitus     on insulin, with h/o DKA   . HTN (hypertension)   . Hyperlipidemia   . Venous stasis ulcers   . Arthritis   . Depression     anxiety  . 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 Hypertension   . Coronary artery disease     angina.  MI.   . Pneumonia        . GERD (gastroesophageal reflux disease)     Hiatal hernia.   . Hypothyroidism   . CHF (congestive heart failure)   . Tuberculosis   . Osteoporosis    Past Surgical History  Procedure Laterality Date  . Cardiac catheterization    . Tonsillectomy    . Coronary artery bypass graft      Dr. Prescott Gum in 09/2011   . Coronary artery bypass graft  2012  . Esophagogastroduodenoscopy N/A 11/17/2013    Procedure: ESOPHAGOGASTRODUODENOSCOPY (EGD);  Surgeon: Lafayette Dragon, MD;  Location: [facility] ENDOSCOPY;  Service: Endoscopy;  Laterality: N/A;  . Colonoscopy N/A 11/18/2013    Procedure: COLONOSCOPY;  Surgeon: Lafayette Dragon, MD;  Location: [facility] ENDOSCOPY;  Service: Endoscopy;  Laterality: N/A;  . Right heart catheterization N/A 07/29/2011    Procedure: RIGHT HEART CATH;  Surgeon: Minus Breeding, MD;  Location: [facility] CATH LAB;  Service: Cardiovascular;  Laterality: N/A;  . Left heart catheterization with coronary angiogram N/A 07/26/2011    Procedure: LEFT HEART CATHETERIZATION WITH CORONARY ANGIOGRAM;  Surgeon: Larey Dresser, MD;  Location: [facility] CATH LAB;  Service:  Cardiovascular;  Laterality: N/A;   Family History  Problem Relation Age of Onset  . Heart disease Father   . Multiple sclerosis Father   . Hypertension Mother   . Hyperthyroidism Mother   . Diabetes Cousin     Multiple maternal cousins with type 2 diabetes mellitus  . Diabetes Maternal Uncle     Type 1 diabetes mellitus  . Heart attack Paternal Grandfather   . Heart disease Paternal Grandfather    History  Substance Use Topics  . Smoking status: Never Smoker   . Smokeless tobacco: Not on file  . Alcohol Use: No   OB History    No data available     Review of Systems  Unable to perform ROS     Allergies  Sulfa antibiotics  Home Medications   Prior to Admission medications   Medication Sig Start Date End Date Taking? Authorizing Provider  aspirin EC 325 MG tablet Take 325 mg by mouth daily.   Yes Historical Provider, MD  carvedilol (COREG) 3.125 MG tablet Take 1 tablet (3.125 mg total) by mouth 2 (two) times daily with a meal. 11/18/14  Yes Larey Dresser, MD  escitalopram (LEXAPRO) 10 MG tablet Take 1 tablet (10 mg total) by mouth daily. 09/27/13  Yes Lysbeth Penner, FNP  Glycerin, Adult, 2.1 G SUPP Place 1 suppository rectally 2 (two) times daily. Patient taking differently: Place 1 suppository rectally daily as needed for moderate constipation.  11/04/14  Yes Eugenie Filler, MD  insulin aspart (NOVOLOG) 100 UNIT/ML injection Inject 8 Units into the skin 3 (three) times daily before meals.   Yes Historical Provider, MD  insulin detemir (LEVEMIR) 100 UNIT/ML injection Inject 0.1 mLs (10 Units total) into the skin daily. 11/04/14  Yes Eugenie Filler, MD  levothyroxine (SYNTHROID, LEVOTHROID) 125 MCG tablet Take 1 tablet (125 mcg total) by mouth daily. 09/27/13  Yes Lysbeth Penner, FNP  metoCLOPramide (REGLAN) 5 MG/5ML solution Take 5 mLs (5 mg total) by mouth 4 (four) times daily -  before meals and at bedtime. 11/04/14  Yes Eugenie Filler, MD  ondansetron  (ZOFRAN ODT) 8 MG disintegrating tablet Take 1 tablet (8 mg total) by mouth every 8 (eight) hours as needed for nausea or vomiting. 10/09/14  Yes Debbe Odea, MD  pantoprazole (PROTONIX) 40 MG tablet Take 1 tablet (40 mg total) by mouth daily. 07/31/14 03/30/17 Yes Ripudeep Krystal Eaton, MD  rosuvastatin (CRESTOR) 40 MG tablet Take 1 tablet (40 mg total) by mouth daily. 09/27/13  Yes Lysbeth Penner, FNP  spironolactone (ALDACTONE) 25 MG tablet Take 25 mg by mouth daily.  10/18/14  Yes Historical Provider, MD  Vitamin D, Ergocalciferol, (DRISDOL) 50000 UNITS CAPS capsule Take 50,000 Units by mouth every Saturday.  09/29/13  Yes Historical Provider, MD  aspirin 81 MG EC tablet TAKE 1 TABLET (81 MG TOTAL) BY MOUTH DAILY. Patient not taking: Reported on 11/19/2014 11/07/14   Jolaine Artist, MD  clopidogrel (PLAVIX) 75 MG tablet Take 1 tablet (75 mg total) by mouth daily with breakfast. Patient not taking: Reported on 11/19/2014 01/12/14   Lysbeth Penner, FNP  enoxaparin (LOVENOX) 30 MG/0.3ML injection Inject 0.3 mLs (30 mg total) into the skin daily. Patient not taking: Reported on 11/19/2014 11/04/14   Eugenie Filler, MD  nystatin (MYCOSTATIN/NYSTOP) 100000 UNIT/GM POWD Apply to groin area 2 times daily. Patient not taking: Reported on 11/19/2014 11/04/14   Eugenie Filler, MD  potassium chloride SA (KLOR-CON M20) 20 MEQ tablet Take 1 tablet (20 mEq total) by mouth daily. Patient not taking: Reported on 11/19/2014 11/18/14   Larey Dresser, MD  protein supplement [facility] CHICKEN SOUP) POWD Take 7 g (2 oz total) by mouth 3 (three) times daily. Patient not taking: Reported on 11/19/2014 11/04/14   Eugenie Filler, MD   BP 139/54 mmHg  Pulse 81  Temp(Src) 98.4 F (36.9 C) (Oral)  Resp 18  SpO2 98% Physical Exam  Constitutional: She is oriented to person, place, and time. She appears well-developed and well-nourished. No distress.  HENT:  Head: Normocephalic and atraumatic.    Eyes: Pupils are equal,  round, and reactive to light.  Neck: Normal range of motion.  Cardiovascular: Normal rate and intact distal pulses.   Pulmonary/Chest: No respiratory distress.  Abdominal: Normal appearance. She exhibits no distension.  Musculoskeletal: She exhibits tenderness.       Left hip: She exhibits tenderness and bony tenderness.       Legs: Neurological: She is alert and oriented to person, place, and time. No cranial nerve deficit.  Skin: Skin is warm and dry. No rash noted.  Psychiatric: She has a normal mood and affect. Her behavior is normal.  Nursing note and vitals reviewed.   ED Course  Procedures (including critical care  time) Labs Review Labs Reviewed  CBG MONITORING, ED - Abnormal; Notable for the following:    Glucose-Capillary 119 (*)    All other components within normal limits  I-STAT CHEM 8, ED - Abnormal; Notable for the following:    Potassium 2.9 (*)    Creatinine, Ser 1.30 (*)    Glucose, Bld 151 (*)    Hemoglobin 9.9 (*)    HCT 29.0 (*)    All other components within normal limits  I-STAT TROPOININ, ED    Imaging Review Ct Head Wo Contrast  11/19/2014   CLINICAL DATA:  Head injury after fall. Possible loss of consciousness.  EXAM: CT HEAD WITHOUT CONTRAST  TECHNIQUE: Contiguous axial images were obtained from the base of the skull through the vertex without intravenous contrast.  COMPARISON:  07/26/2014  FINDINGS: No intracranial hemorrhage, mass effect, or midline shift. No hydrocephalus. The basilar cisterns are patent. No evidence of territorial infarct. No intracranial fluid collection. Mild generalized atrophy. Calvarium is intact. Unchanged chronic calcification adjacent to the inner table of left frontal skull. Included paranasal sinuses and mastoid air cells are well aerated.  IMPRESSION: No acute intracranial abnormality.   Electronically Signed   By: Jeb Levering M.D.   On: 11/19/2014 22:49   Dg Hips Bilat With Pelvis Min 5 Views  11/19/2014   CLINICAL  DATA:  Status post fall, with bilateral hip pain, and bruising about the hips and groin. Initial encounter.  EXAM: BILATERAL HIP (WITH PELVIS) 5-6 VIEWS  COMPARISON:  None.  FINDINGS: There is a mildly displaced transcervical fracture through the left femoral neck, with superior displacement of the distal femur and mild rotation of the left femoral head. No additional fractures are seen. Both femoral heads remain seated within their respective acetabuli. The right hip joint is grossly unremarkable in appearance.  Mild sclerotic change is seen at the sacroiliac joints. Clips are noted overlying the right inguinal region. Scattered vascular calcifications are seen. The visualized bowel gas pattern is grossly unremarkable.  IMPRESSION: 1. Mildly displaced transcervical fracture through the left femoral neck. 2. Scattered vascular calcifications seen.   Electronically Signed   By: Garald Balding M.D.   On: 11/19/2014 22:54     EKG Interpretation   Date/Time:  Saturday November 19 2014 21:47:38 EST Ventricular Rate:  87 PR Interval:  63 QRS Duration: 131 QT Interval:  421 QTC Calculation: 506 R Axis:   -77 Text Interpretation:  Sinus rhythm Multiform ventricular premature  complexes Short PR interval Consider right atrial enlargement Left bundle  branch block Artifact in lead(s) I III aVR aVL aVF Abnormal ekg Confirmed  by Silviano Neuser  MD, Ysenia Filice ([id number]) on 11/19/2014 10:33:43 PM     Discussed with orthopedics(Dr. Ninfa Linden) who will see her in the morning  MDM   Final diagnoses:  Fall  Closed left hip fracture, initial encounter        Leonard Schwartz, MD 11/19/14 [id number]  Leonard Schwartz, MD 11/19/14 [phone]

## 2014-11-19 NOTE — ED Notes (Signed)
Delay on lab draw, pt in exray 

## 2014-11-20 ENCOUNTER — Encounter (HOSPITAL_COMMUNITY)
Admission: EM | Disposition: A | Discharge status: Skilled Nursing Facility | Payer: Self-pay | Source: Home / Self Care | Attending: Internal Medicine

## 2014-11-20 DIAGNOSIS — I2510 Atherosclerotic heart disease of native coronary artery without angina pectoris: Secondary | ICD-10-CM

## 2014-11-20 DIAGNOSIS — D649 Anemia, unspecified: Secondary | ICD-10-CM

## 2014-11-20 LAB — CBC
HEMATOCRIT: 24.6 % — AB (ref 36.0–46.0)
Hemoglobin: 8.1 g/dL — ABNORMAL LOW (ref 12.0–15.0)
MCH: 30.1 pg (ref 26.0–34.0)
MCHC: 32.9 g/dL (ref 30.0–36.0)
MCV: 91.4 fL (ref 78.0–100.0)
Platelets: 275 10*3/uL (ref 150–400)
RBC: 2.69 MIL/uL — ABNORMAL LOW (ref 3.87–5.11)
RDW: 14.7 % (ref 11.5–15.5)
WBC: 11.7 10*3/uL — ABNORMAL HIGH (ref 4.0–10.5)

## 2014-11-20 LAB — SURGICAL PCR SCREEN
MRSA, PCR: NEGATIVE
STAPHYLOCOCCUS AUREUS: POSITIVE — AB

## 2014-11-20 LAB — GLUCOSE, CAPILLARY
GLUCOSE-CAPILLARY: 487 mg/dL — AB (ref 70–99)
GLUCOSE-CAPILLARY: 294 mg/dL — AB (ref 70–99)
GLUCOSE-CAPILLARY: 33 mg/dL — AB (ref 70–99)
GLUCOSE-CAPILLARY: 168 mg/dL — AB (ref 70–99)
Glucose-Capillary: 310 mg/dL — ABNORMAL HIGH (ref 70–99)
Glucose-Capillary: 194 mg/dL — ABNORMAL HIGH (ref 70–99)
Glucose-Capillary: 102 mg/dL — ABNORMAL HIGH (ref 70–99)
Glucose-Capillary: 64 mg/dL — ABNORMAL LOW (ref 70–99)

## 2014-11-20 LAB — COMPREHENSIVE METABOLIC PANEL
ALBUMIN: 3.7 g/dL (ref 3.5–5.2)
ALK PHOS: 100 U/L (ref 39–117)
ALT: 18 U/L (ref 0–35)
ALT: 19 U/L (ref 0–35)
ANION GAP: 14 (ref 5–15)
AST: 18 U/L (ref 0–37)
AST: 22 U/L (ref 0–37)
Albumin: 3.7 g/dL (ref 3.5–5.2)
Alkaline Phosphatase: 100 U/L (ref 39–117)
Anion gap: 18 — ABNORMAL HIGH (ref 5–15)
BILIRUBIN TOTAL: 1.2 mg/dL (ref 0.3–1.2)
BUN: 18 mg/dL (ref 6–23)
BUN: 19 mg/dL (ref 6–23)
CHLORIDE: 100 mmol/L (ref 96–112)
CO2: 15 mmol/L — ABNORMAL LOW (ref 19–32)
CO2: 17 mmol/L — ABNORMAL LOW (ref 19–32)
Calcium: 8.8 mg/dL (ref 8.4–10.5)
Calcium: 9.1 mg/dL (ref 8.4–10.5)
Chloride: 106 mmol/L (ref 96–112)
Creatinine, Ser: 1.48 mg/dL — ABNORMAL HIGH (ref 0.50–1.10)
Creatinine, Ser: 1.63 mg/dL — ABNORMAL HIGH (ref 0.50–1.10)
GFR calc Af Amer: 37 mL/min — ABNORMAL LOW (ref >90)
GFR calc non Af Amer: 36 mL/min — ABNORMAL LOW (ref >90)
GFR calc non Af Amer: 32 mL/min — ABNORMAL LOW (ref >90)
GFR, EST AFRICAN AMERICAN: 42 mL/min — AB (ref >90)
GLUCOSE: 427 mg/dL — AB (ref 70–99)
Glucose, Bld: 275 mg/dL — ABNORMAL HIGH (ref 70–99)
Potassium: 3.9 mmol/L (ref 3.5–5.1)
Potassium: 2.8 mmol/L — ABNORMAL LOW (ref 3.5–5.1)
Sodium: 133 mmol/L — ABNORMAL LOW (ref 135–145)
Sodium: 137 mmol/L (ref 135–145)
Total Bilirubin: 1.6 mg/dL — ABNORMAL HIGH (ref 0.3–1.2)
Total Protein: 6.4 g/dL (ref 6.0–8.3)
Total Protein: 6.5 g/dL (ref 6.0–8.3)

## 2014-11-20 LAB — CBC WITH DIFFERENTIAL/PLATELET
BASOS ABS: 0 10*3/uL (ref 0.0–0.1)
Basophils Relative: 0 % (ref 0–1)
Eosinophils Absolute: 0.1 10*3/uL (ref 0.0–0.7)
Eosinophils Relative: 1 % (ref 0–5)
HCT: 24.4 % — ABNORMAL LOW (ref 36.0–46.0)
Hemoglobin: 8.3 g/dL — ABNORMAL LOW (ref 12.0–15.0)
LYMPHS PCT: 8 % — AB (ref 12–46)
Lymphs Abs: 1 10*3/uL (ref 0.7–4.0)
MCH: 30.9 pg (ref 26.0–34.0)
MCHC: 34 g/dL (ref 30.0–36.0)
MCV: 90.7 fL (ref 78.0–100.0)
Monocytes Absolute: 0.8 10*3/uL (ref 0.1–1.0)
Monocytes Relative: 6 % (ref 3–12)
NEUTROS ABS: 10.9 10*3/uL — AB (ref 1.7–7.7)
Neutrophils Relative %: 85 % — ABNORMAL HIGH (ref 43–77)
PLATELETS: 277 10*3/uL (ref 150–400)
RBC: 2.69 MIL/uL — ABNORMAL LOW (ref 3.87–5.11)
RDW: 14.5 % (ref 11.5–15.5)
WBC: 12.7 10*3/uL — ABNORMAL HIGH (ref 4.0–10.5)

## 2014-11-20 LAB — PROTIME-INR
INR: 1.16 (ref 0.00–1.49)
PROTHROMBIN TIME: 15 s (ref 11.6–15.2)

## 2014-11-20 LAB — ABO/RH: ABO/RH(D): B POS

## 2014-11-20 LAB — PREPARE RBC (CROSSMATCH)

## 2014-11-20 LAB — MAGNESIUM: Magnesium: 1.5 mg/dL (ref 1.5–2.5)

## 2014-11-20 LAB — APTT: APTT: 27 s (ref 24–37)

## 2014-11-20 LAB — BRAIN NATRIURETIC PEPTIDE: B NATRIURETIC PEPTIDE 5: 321.3 pg/mL — AB (ref 0.0–100.0)

## 2014-11-20 SURGERY — ARTHROPLASTY, HIP, TOTAL, ANTERIOR APPROACH
Anesthesia: Choice

## 2014-11-20 MED ORDER — SODIUM CHLORIDE 0.9 % IJ SOLN
10.0000 mL | INTRAMUSCULAR | Status: DC | PRN
  Administered 2014-11-21: 20 mL
  Administered 2014-11-22 (×2): 10 mL
  Administered 2014-11-23 (×2): 20 mL
  Administered 2014-11-24: 10 mL
  Administered 2014-11-24: 20 mL
  Administered 2014-11-25: 40 mL
  Administered 2014-11-25 (×3): 10 mL
  Administered 2014-11-26 – 2014-11-28 (×3): 20 mL
  Filled 2014-11-20 (×14): qty 40

## 2014-11-20 MED ORDER — OXYCODONE-ACETAMINOPHEN 5-325 MG PO TABS
1.0000 | ORAL_TABLET | ORAL | Status: DC | PRN
  Administered 2014-11-20 – 2014-11-21 (×3): 1 via ORAL
  Filled 2014-11-20 (×3): qty 1

## 2014-11-20 MED ORDER — INSULIN DETEMIR 100 UNIT/ML ~~LOC~~ SOLN
7.0000 [IU] | Freq: Every day | SUBCUTANEOUS | Status: DC
  Administered 2014-11-21: 7 [IU] via SUBCUTANEOUS
  Filled 2014-11-20 (×3): qty 0.07

## 2014-11-20 MED ORDER — SODIUM CHLORIDE 0.9 % IJ SOLN
10.0000 mL | Freq: Two times a day (BID) | INTRAMUSCULAR | Status: DC
  Administered 2014-11-20 – 2014-11-26 (×5): 10 mL

## 2014-11-20 MED ORDER — POTASSIUM CHLORIDE CRYS ER 20 MEQ PO TBCR
40.0000 meq | EXTENDED_RELEASE_TABLET | ORAL | Status: AC
  Administered 2014-11-20 (×2): 40 meq via ORAL
  Filled 2014-11-20 (×2): qty 2

## 2014-11-20 MED ORDER — PROPOFOL 10 MG/ML IV BOLUS
INTRAVENOUS | Status: AC
  Filled 2014-11-20: qty 20

## 2014-11-20 MED ORDER — MORPHINE SULFATE 2 MG/ML IJ SOLN
1.0000 mg | INTRAMUSCULAR | Status: DC | PRN
  Administered 2014-11-20 (×4): 1 mg via INTRAVENOUS
  Filled 2014-11-20 (×4): qty 1

## 2014-11-20 MED ORDER — MIDAZOLAM HCL 2 MG/2ML IJ SOLN
INTRAMUSCULAR | Status: AC
  Filled 2014-11-20: qty 2

## 2014-11-20 MED ORDER — POTASSIUM CHLORIDE 10 MEQ/100ML IV SOLN
10.0000 meq | INTRAVENOUS | Status: AC
  Administered 2014-11-20 (×3): 10 meq via INTRAVENOUS
  Filled 2014-11-20 (×3): qty 100

## 2014-11-20 MED ORDER — INSULIN ASPART 100 UNIT/ML ~~LOC~~ SOLN: 0.0000 [IU] | SUBCUTANEOUS | Status: DC

## 2014-11-20 MED ORDER — SODIUM CHLORIDE 0.9 % IV SOLN
INTRAVENOUS | Status: DC
  Administered 2014-11-20: 11:00:00 via INTRAVENOUS
  Filled 2014-11-20: qty 2.5

## 2014-11-20 MED ORDER — HYDROMORPHONE HCL 1 MG/ML IJ SOLN: 0.2500 mg | INTRAMUSCULAR | Status: DC | PRN

## 2014-11-20 MED ORDER — DEXAMETHASONE SODIUM PHOSPHATE 10 MG/ML IJ SOLN
INTRAMUSCULAR | Status: AC
  Filled 2014-11-20: qty 1

## 2014-11-20 MED ORDER — HYDROXYZINE HCL 50 MG/ML IM SOLN
25.0000 mg | Freq: Four times a day (QID) | INTRAMUSCULAR | Status: DC | PRN
  Administered 2014-11-20: 25 mg via INTRAMUSCULAR
  Filled 2014-11-20 (×3): qty 0.5

## 2014-11-20 MED ORDER — INSULIN ASPART 100 UNIT/ML ~~LOC~~ SOLN
10.0000 [IU] | Freq: Once | SUBCUTANEOUS | Status: AC
  Administered 2014-11-20: 10 [IU] via SUBCUTANEOUS

## 2014-11-20 MED ORDER — INSULIN ASPART 100 UNIT/ML ~~LOC~~ SOLN
3.0000 [IU] | Freq: Three times a day (TID) | SUBCUTANEOUS | Status: DC
  Administered 2014-11-21 – 2014-11-27 (×15): 3 [IU] via SUBCUTANEOUS

## 2014-11-20 MED ORDER — CISATRACURIUM BESYLATE 20 MG/10ML IV SOLN
INTRAVENOUS | Status: AC
  Filled 2014-11-20: qty 10

## 2014-11-20 MED ORDER — INSULIN ASPART 100 UNIT/ML ~~LOC~~ SOLN
4.0000 [IU] | Freq: Three times a day (TID) | SUBCUTANEOUS | Status: DC
  Administered 2014-11-20: 4 [IU] via SUBCUTANEOUS

## 2014-11-20 MED ORDER — INSULIN ASPART 100 UNIT/ML ~~LOC~~ SOLN
0.0000 [IU] | Freq: Three times a day (TID) | SUBCUTANEOUS | Status: DC
  Administered 2014-11-20: 2 [IU] via SUBCUTANEOUS
  Administered 2014-11-21 – 2014-11-22 (×2): 7 [IU] via SUBCUTANEOUS
  Administered 2014-11-22: 2 [IU] via SUBCUTANEOUS
  Administered 2014-11-22: 1 [IU] via SUBCUTANEOUS
  Administered 2014-11-23: 9 [IU] via SUBCUTANEOUS
  Administered 2014-11-23: 2 [IU] via SUBCUTANEOUS
  Administered 2014-11-23: 1 [IU] via SUBCUTANEOUS
  Administered 2014-11-24: 3 [IU] via SUBCUTANEOUS
  Administered 2014-11-24 – 2014-11-25 (×2): 5 [IU] via SUBCUTANEOUS
  Administered 2014-11-25: 6 [IU] via SUBCUTANEOUS
  Administered 2014-11-26: 9 [IU] via SUBCUTANEOUS
  Administered 2014-11-26: 5 [IU] via SUBCUTANEOUS
  Administered 2014-11-26: 7 [IU] via SUBCUTANEOUS
  Administered 2014-11-27: 5 [IU] via SUBCUTANEOUS
  Administered 2014-11-27: 7 [IU] via SUBCUTANEOUS
  Administered 2014-11-27: 2 [IU] via SUBCUTANEOUS
  Administered 2014-11-28: 5 [IU] via SUBCUTANEOUS
  Administered 2014-11-28: 1 [IU] via SUBCUTANEOUS
  Administered 2014-11-28: 3 [IU] via SUBCUTANEOUS

## 2014-11-20 MED ORDER — CARVEDILOL 3.125 MG PO TABS
3.1250 mg | ORAL_TABLET | Freq: Two times a day (BID) | ORAL | Status: DC
  Administered 2014-11-20 – 2014-11-28 (×12): 3.125 mg via ORAL
  Filled 2014-11-20 (×16): qty 1

## 2014-11-20 MED ORDER — SODIUM CHLORIDE 0.9 % IV SOLN
INTRAVENOUS | Status: DC
  Administered 2014-11-20: 10:00:00 via INTRAVENOUS

## 2014-11-20 MED ORDER — SODIUM CHLORIDE 0.9 % IJ SOLN
3.0000 mL | Freq: Two times a day (BID) | INTRAMUSCULAR | Status: DC
Start: 2014-11-20 — End: 2014-11-28
  Administered 2014-11-20 – 2014-11-26 (×9): 3 mL via INTRAVENOUS

## 2014-11-20 MED ORDER — INSULIN DETEMIR 100 UNIT/ML ~~LOC~~ SOLN: 10.0000 [IU] | Freq: Every day | SUBCUTANEOUS | Status: DC

## 2014-11-20 MED ORDER — SPIRONOLACTONE 25 MG PO TABS
25.0000 mg | ORAL_TABLET | Freq: Every day | ORAL | Status: DC
  Administered 2014-11-22 – 2014-11-27 (×6): 25 mg via ORAL
  Filled 2014-11-20 (×8): qty 1

## 2014-11-20 MED ORDER — LIDOCAINE HCL (CARDIAC) 20 MG/ML IV SOLN
INTRAVENOUS | Status: AC
  Filled 2014-11-20: qty 5

## 2014-11-20 MED ORDER — SODIUM CHLORIDE 0.9 % IV SOLN
INTRAVENOUS | Status: DC
  Administered 2014-11-20: 08:00:00 via INTRAVENOUS

## 2014-11-20 MED ORDER — INSULIN ASPART 100 UNIT/ML ~~LOC~~ SOLN: 0.0000 [IU] | Freq: Three times a day (TID) | SUBCUTANEOUS | Status: DC

## 2014-11-20 MED ORDER — DEXTROSE 50 % IV SOLN
25.0000 mL | INTRAVENOUS | Status: DC | PRN
  Administered 2014-11-20: 25 mL via INTRAVENOUS
  Filled 2014-11-20: qty 50

## 2014-11-20 MED ORDER — METOCLOPRAMIDE HCL 5 MG/5ML PO SOLN
5.0000 mg | Freq: Three times a day (TID) | ORAL | Status: DC
  Administered 2014-11-20 – 2014-11-28 (×28): 5 mg via ORAL
  Filled 2014-11-20 (×39): qty 5

## 2014-11-20 MED ORDER — PROMETHAZINE HCL 25 MG/ML IJ SOLN: 12.5000 mg | Freq: Four times a day (QID) | INTRAMUSCULAR | Status: DC | PRN

## 2014-11-20 MED ORDER — FENTANYL CITRATE 0.05 MG/ML IJ SOLN
INTRAMUSCULAR | Status: AC
  Filled 2014-11-20: qty 5

## 2014-11-20 MED ORDER — PANTOPRAZOLE SODIUM 40 MG IV SOLR
40.0000 mg | Freq: Two times a day (BID) | INTRAVENOUS | Status: DC
  Administered 2014-11-20 – 2014-11-22 (×4): 40 mg via INTRAVENOUS
  Filled 2014-11-20 (×7): qty 40

## 2014-11-20 MED ORDER — INSULIN REGULAR BOLUS VIA INFUSION
0.0000 [IU] | Freq: Three times a day (TID) | INTRAVENOUS | Status: DC
  Filled 2014-11-20: qty 10

## 2014-11-20 MED ORDER — INSULIN ASPART 100 UNIT/ML ~~LOC~~ SOLN: 0.0000 [IU] | Freq: Every day | SUBCUTANEOUS | Status: DC

## 2014-11-20 MED ORDER — SODIUM CHLORIDE 0.9 % IV SOLN
INTRAVENOUS | Status: DC
  Administered 2014-11-20: 02:00:00 via INTRAVENOUS

## 2014-11-20 MED ORDER — LEVOTHYROXINE SODIUM 125 MCG PO TABS
125.0000 ug | ORAL_TABLET | Freq: Every day | ORAL | Status: DC
  Administered 2014-11-20 – 2014-11-28 (×9): 125 ug via ORAL
  Filled 2014-11-20 (×10): qty 1

## 2014-11-20 MED ORDER — GLYCERIN (LAXATIVE) 2.1 G RE SUPP
1.0000 | Freq: Every day | RECTAL | Status: DC | PRN
  Filled 2014-11-20: qty 1

## 2014-11-20 MED ORDER — UNJURY CHICKEN SOUP POWDER
2.0000 [oz_av] | Freq: Three times a day (TID) | ORAL | Status: DC
  Filled 2014-11-20 (×6): qty 27

## 2014-11-20 MED ORDER — INSULIN ASPART 100 UNIT/ML ~~LOC~~ SOLN: 4.0000 [IU] | Freq: Three times a day (TID) | SUBCUTANEOUS | Status: DC

## 2014-11-20 MED ORDER — ESCITALOPRAM OXALATE 10 MG PO TABS
10.0000 mg | ORAL_TABLET | Freq: Every day | ORAL | Status: DC
  Administered 2014-11-20 – 2014-11-28 (×8): 10 mg via ORAL
  Filled 2014-11-20 (×8): qty 1

## 2014-11-20 MED ORDER — DEXTROSE-NACL 5-0.45 % IV SOLN: INTRAVENOUS | Status: DC

## 2014-11-20 MED ORDER — INSULIN DETEMIR 100 UNIT/ML ~~LOC~~ SOLN
7.0000 [IU] | Freq: Every day | SUBCUTANEOUS | Status: DC
  Filled 2014-11-20: qty 0.07

## 2014-11-20 MED ORDER — ENSURE COMPLETE PO LIQD
237.0000 mL | Freq: Two times a day (BID) | ORAL | Status: DC
Start: 2014-11-20 — End: 2014-11-21
  Administered 2014-11-20: 237 mL via ORAL

## 2014-11-20 MED ORDER — FUROSEMIDE 10 MG/ML IJ SOLN
20.0000 mg | Freq: Once | INTRAMUSCULAR | Status: AC
  Administered 2014-11-20: 20 mg via INTRAVENOUS
  Filled 2014-11-20: qty 2

## 2014-11-20 MED ORDER — PANTOPRAZOLE SODIUM 40 MG PO TBEC
40.0000 mg | DELAYED_RELEASE_TABLET | Freq: Every day | ORAL | Status: DC
  Administered 2014-11-20: 40 mg via ORAL
  Filled 2014-11-20: qty 1

## 2014-11-20 MED ORDER — ONDANSETRON HCL 4 MG/2ML IJ SOLN
INTRAMUSCULAR | Status: AC
  Filled 2014-11-20: qty 2

## 2014-11-20 MED ORDER — HEPARIN SODIUM (PORCINE) 5000 UNIT/ML IJ SOLN
5000.0000 [IU] | Freq: Three times a day (TID) | INTRAMUSCULAR | Status: DC
  Administered 2014-11-20 – 2014-11-28 (×25): 5000 [IU] via SUBCUTANEOUS
  Filled 2014-11-20 (×24): qty 1

## 2014-11-20 MED ORDER — LACTATED RINGERS IV SOLN
INTRAVENOUS | Status: DC
  Administered 2014-11-20: 21:00:00 via INTRAVENOUS

## 2014-11-20 MED ORDER — INSULIN DETEMIR 100 UNIT/ML ~~LOC~~ SOLN
7.0000 [IU] | Freq: Every day | SUBCUTANEOUS | Status: DC
  Administered 2014-11-20: 7 [IU] via SUBCUTANEOUS
  Filled 2014-11-20: qty 0.07

## 2014-11-20 MED ORDER — ROSUVASTATIN CALCIUM 40 MG PO TABS
40.0000 mg | ORAL_TABLET | Freq: Every day | ORAL | Status: DC
  Administered 2014-11-22 – 2014-11-28 (×7): 40 mg via ORAL
  Filled 2014-11-20 (×9): qty 1

## 2014-11-20 MED ORDER — SODIUM CHLORIDE 0.9 % IV SOLN
Freq: Once | INTRAVENOUS | Status: AC
  Administered 2014-11-20: 08:00:00 via INTRAVENOUS

## 2014-11-20 MED ORDER — ASPIRIN EC 325 MG PO TBEC
325.0000 mg | DELAYED_RELEASE_TABLET | Freq: Every day | ORAL | Status: DC
  Administered 2014-11-20 – 2014-11-24 (×3): 325 mg via ORAL
  Filled 2014-11-20 (×5): qty 1

## 2014-11-20 MED ORDER — INSULIN ASPART 100 UNIT/ML ~~LOC~~ SOLN
0.0000 [IU] | Freq: Every day | SUBCUTANEOUS | Status: DC
  Administered 2014-11-24: 2 [IU] via SUBCUTANEOUS
  Administered 2014-11-25: 3 [IU] via SUBCUTANEOUS
  Administered 2014-11-26: 2 [IU] via SUBCUTANEOUS

## 2014-11-20 NOTE — Progress Notes (Signed)
Hypoglycemic Event  CBG: 33  Treatment: D50 IV 25 mL  Symptoms: Pale  Follow-up CBG: Time:1640 CBG Result:168   Possible Reasons for Event: Medication Given- Inadequate meal intake- Patient family tried to feed patient and she did not eat very much for her lunch.  Comments/MD notified:Notified Dr. Tana Coast over low blood sugar and re-check- Will relay to [place].     Lenise Herald  Remember to initiate Hypoglycemia Order Set & complete

## 2014-11-20 NOTE — Progress Notes (Signed)
Patient ID: Bridget Martinez, female   DOB: 1950-12-26, 64 y.o.   MRN: [id number] Due to her blood glucose being so high still, it would be safer to delay her surgery until tomorrow.  I have spoken to her and her family at the bedside and they understand this fully.  Will also need to have her transferred to [facility] due to OR availability tomorrow.  Surgery will be after 5 pm tomorrow 3/14, but better chance at getting her on early evening over there as opposed to [facility].  The patient and her family understand this as well.  Can resume a diet today and can have breakfast tomorrow am with NPO after breakfast tomorrow.

## 2014-11-20 NOTE — Progress Notes (Addendum)
Patient ID: Bridget Martinez  female  [id number]    DOB: 1950-10-24    DOA: 11/19/2014  PCP: [name], FNP  History of present illness Bridget Martinez is a 64 y.o. 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 Patient reports that she had fall when she was working in kitchen at home at about 7:30 PM. She did not pass out, no chest pain or palpitation before the event. It was most likely a mechanical fall per her husband. She had a small amount of bleeding from a small scalp laceration of her head. She developed moderate pain over left hip. She could not stand on left leg without pain. No neck pain. Patient denied fever, chills, headaches, cough, chest pain, SOB, abdominal pain, diarrhea, constipation, dysuria, urgency, frequency, hematuria, skin rashes or leg swelling. No unilateral weakness, numbness or tingling sensations. No vision change or hearing loss. In ED, patient was found to have mildly displaced transcervical fracture through the left femoral neck by X-ray. CT-head is negative for acute abnormalities. Hypokalemia, negative troponin, stable hemoglobin, stable renal function. Patient was admitted to inpatient for further evaluation and treatment. Orthopedic surgery was consulted by ED.  Assessment/Plan: Principal Problem: Left femoral neck fracture status post mechanical fall: Per patient's family, she had been home only for a week from the skilled nursing facility  -Orthopedic surgery consulted, patient was seen by Dr. Ninfa Linden today, planning surgery today. - Patient is high risk for surgery given EF of 30-35%, multiple medical problems however due to alleviation of pain and quality of life, patient wants to proceed with the  surgery, patient is cleared for surgery.  - Hemoglobin 8.1, will transfuse 1 unit packed RBC prior to the surgery, IV Lasix 20 mg x1 after the blood transfusion - Blood sugars also uncontrolled in 400s, given subcutaneous insulin and Levemir, but no significant effect, will place on IV glucostabilizer perioperatively until patient is tolerating diet after the surgery - Addendum 11:05 AM : Received a call from Dr. Ninfa Linden, due to hyperglycemia, anemia, recommended delaying her surgery until tomorrow and need to transfer to [facility] due to OR availability tomorrow. Recommended to resume the diet today and NPO after breakfast tomorrow. Family agreeable, accepted by Dr Cruzita Lederer at [facility]. Will DC the IV glucose stabilizer now and placed on sliding scale insulin, meal coverage and Levemir  Diabetes mellitus-type I: Patient has a history of DKA. Poorly controlled diabetes. Recent A1c was 10.0 on 10/26/14. Per patient's family at the bedside, she has significant difficulty controlling the blood sugars due to gastroparesis. - DC glucose stabilizer, placed on sliding scale insulin, meal coverage and Levemir  Diabetic gastroparesis: -patient's QTc=506, hold Zofran, repeat EKG, if still QTc prolonged, will have to discontinue Reglan, place on Phenergan for nausea  CKD-III: Baseline creatinine 1.4-1.6. Her creatinine is 1.30. Renal function stable. -Currently renal function stable, placed on gentle hydration,NPO status  Combined systolic and diastolic Congestive heart failure:  - 2-D echo on 10/29/14 showed EF 30-35% with grade 2 diastolic dysfunction, currently compensated however on IV fluids, avoid fluid overload -Continue aspirin, Coreg and spironolactone - Lasix after the transfusion  Coronary artery disease: Post status of CABG in 2013. Patient does not have chest pain. -Continue aspirin, Coreg, Crestor - Holding Plavix for the surgery  GERD: -  Placed on IV Protonix, NPO   Hyperlipidemia:  LDL was at 172 on 09/27/13 -Continue Crestor  Severe protein calorie malnutrition - nutrition supplementation.  Hypokalemia:  -Replaced overnight, currently 3.9   Hypomagnesemia 1.5 -will give 2 g IV magnesium  Anemia: previous anemia panel consistent with anemia of chronic disease. H&H remained stable.  - Hemoglobin 8.1, discussed with orthopedic surgery, recommended to transfuse 1 unit packed RBC  Hypothyroidism: TSH was 2.7 on 07/26/14 -Continued on home regimen of Synthroid.  Code Status: Full code  DVT Prophylaxis: Subcutaneous heparin  Family Communication: Discussed in detail with the patient, son and daughter-in-law at the bedside, all imaging results, lab results explained to the patient and her family. Patient's family agreeable with transfer to [facility]    Disposition: Will need skilled nursing facility  Consultants:  Ortho- Dr Ninfa Linden  Procedures:  none  Antibiotics:  none    Subjective: Patient seen and examined, uncomfortable due to pain otherwise no chest pain, shortness of breath, dizziness, lightheadedness, nausea or vomiting   Objective: Weight change:   Intake/Output Summary (Last 24 hours) at 11/20/14 1028 Last data filed at 11/20/14 0900  Gross per 24 hour  Intake    385 ml  Output    500 ml  Net   -115 ml   Blood pressure 125/49, pulse 83, temperature 98.9 F (37.2 C), temperature source Oral, resp. rate 18, height 5' 3.5" (1.613 m), weight 58 kg (127 lb 13.9 oz), SpO2 100 %.  Physical Exam: General: Alert and awake, oriented x3, not in any acute distress. HEENT: anicteric sclera, PERLA, EOMI, small scalp laceration CVS: S1-S2 clear, no murmur rubs or gallops Chest: clear to auscultation bilaterally, no wheezing, rales or rhonchi Abdomen: soft nontender, nondistended, normal bowel sounds  Extremities: no cyanosis, clubbing or edema noted bilaterally, bony tenderness over left hip   Lab Results: Basic Metabolic  Panel:  Recent Labs Lab 11/15/14 1530 11/19/14 2306 11/20/14 0535  NA 135 136 133*  K 3.3* 2.9* 3.9  CL 102 100 100  CO2 25  --  15*  GLUCOSE 81 151* 427*  BUN 12 14 18   CREATININE 1.46* 1.30* 1.48*  CALCIUM 9.4  --  8.8  MG  --   --  1.5   Liver Function Tests:  Recent Labs Lab 11/20/14 0535  AST 18  ALT 18  ALKPHOS 100  BILITOT 1.6*  PROT 6.4  ALBUMIN 3.7   No results for input(s): LIPASE, AMYLASE in the last 168 hours. No results for input(s): AMMONIA in the last 168 hours. CBC:  Recent Labs Lab 11/20/14 0150 11/20/14 0535  WBC 12.7* 11.7*  NEUTROABS 10.9*  --   HGB 8.3* 8.1*  HCT 24.4* 24.6*  MCV 90.7 91.4  PLT 277 275   Cardiac Enzymes: No results for input(s): CKTOTAL, CKMB, CKMBINDEX, TROPONINI in the last 168 hours. BNP: Invalid input(s): POCBNP CBG:  Recent Labs Lab 11/19/14 2157 11/20/14 0155 11/20/14 0739  GLUCAP 119* 310* 487*     Micro Results: No results found for this or any previous visit (from the past 240 hour(s)).  Studies/Results: Ct Head Wo Contrast  11/19/2014   CLINICAL DATA:  Head injury after fall. Possible loss of consciousness.  EXAM: CT HEAD WITHOUT CONTRAST  TECHNIQUE: Contiguous axial images were obtained from the base of the skull through the vertex without intravenous contrast.  COMPARISON:  07/26/2014  FINDINGS: No intracranial hemorrhage, mass effect, or midline shift. No hydrocephalus. The basilar cisterns are patent. No  evidence of territorial infarct. No intracranial fluid collection. Mild generalized atrophy. Calvarium is intact. Unchanged chronic calcification adjacent to the inner table of left frontal skull. Included paranasal sinuses and mastoid air cells are well aerated.  IMPRESSION: No acute intracranial abnormality.   Electronically Signed   By: Jeb Levering M.D.   On: 11/19/2014 22:49   US Renal  10/22/2014   CLINICAL DATA:  Acute renal failure.  EXAM: RENAL/URINARY TRACT ULTRASOUND COMPLETE   COMPARISON:  CT of the abdomen and pelvis on 07/30/2014 and ultrasound 05/23/2012  FINDINGS: Right Kidney:  Length: 12.8 cm. Small upper pole cyst is 0.9 x 0.8 x 1.0 cm. No hydronephrosis. Normal renal echogenicity.  Left Kidney:  Length: 11.6 cm. Echogenicity within normal limits. No mass or hydronephrosis visualized.  Bladder:  The bladder is partially decompressed by a Foley catheter.  IMPRESSION: 1. No hydronephrosis. 2. Stable right renal cyst.   Electronically Signed   By: Nolon Nations M.D.   On: 10/22/2014 10:42   Portable Chest X-ray (1 View)  10/26/2014   CLINICAL DATA:  Diabetic ketoacidosis.  Hypertension  EXAM: PORTABLE CHEST - 1 VIEW  COMPARISON:  October 21, 2014.  FINDINGS: Lungs are clear. Heart is mildly enlarged with pulmonary vascularity within normal limits. Patient is status post coronary artery bypass grafting. No adenopathy. No bone lesions.  IMPRESSION: Heart mildly enlarged.  No lung edema or consolidation.   Electronically Signed   By: Lowella Grip III M.D.   On: 10/26/2014 15:21   Dg Hips Bilat With Pelvis Min 5 Views  11/19/2014   CLINICAL DATA:  Status post fall, with bilateral hip pain, and bruising about the hips and groin. Initial encounter.  EXAM: BILATERAL HIP (WITH PELVIS) 5-6 VIEWS  COMPARISON:  None.  FINDINGS: There is a mildly displaced transcervical fracture through the left femoral neck, with superior displacement of the distal femur and mild rotation of the left femoral head. No additional fractures are seen. Both femoral heads remain seated within their respective acetabuli. The right hip joint is grossly unremarkable in appearance.  Mild sclerotic change is seen at the sacroiliac joints. Clips are noted overlying the right inguinal region. Scattered vascular calcifications are seen. The visualized bowel gas pattern is grossly unremarkable.  IMPRESSION: 1. Mildly displaced transcervical fracture through the left femoral neck. 2. Scattered vascular  calcifications seen.   Electronically Signed   By: Garald Balding M.D.   On: 11/19/2014 22:54    Medications: Scheduled Meds: . aspirin EC  325 mg Oral Daily  . carvedilol  3.125 mg Oral BID WC  . escitalopram  10 mg Oral Daily  . feeding supplement (ENSURE COMPLETE)  237 mL Oral BID BM  . furosemide  20 mg Intravenous Once  . heparin  5,000 Units Subcutaneous 3 times per day  . insulin regular  0-10 Units Intravenous TID WC  . levothyroxine  125 mcg Oral QAC breakfast  . metoCLOPramide  5 mg Oral TID AC & HS  . pantoprazole  40 mg Oral Daily  . protein supplement  2 oz Oral TID  . rosuvastatin  40 mg Oral Daily  . sodium chloride  3 mL Intravenous Q12H  . spironolactone  25 mg Oral Daily   Time spent 25 minutes   LOS: 1 day   Sherryl Valido M.D. [facility] 11/20/2014, 10:28 AM Pager: [phone]  If 7PM-7AM, please contact night-coverage [email or web address] Password [email or web address]

## 2014-11-20 NOTE — Progress Notes (Signed)
Paged MD regarding low blood sugar of 33. Gave 34ml of D50. Will call [facility] and let them know of incident.

## 2014-11-20 NOTE — Consult Note (Signed)
Reason for Consult:  Left hip fracture Referring Physician:   EDP  Bridget Martinez is an 64 y.o. female.  HPI:   64 yo female with multiple medical problems who unfortunately sustained a mechanical fall last evening at home in her kitchen.  She was taken to the [facility] ED and found to have a left hip fracture.  Ortho is consulted to assess the fracture and recommend treatment.  She does have multiple medical problems and was only discharged from the hospital about a day or so ago.  She does report left hip pain.  Past Medical History  Diagnosis Date  . Diabetes mellitus     on insulin, with h/o DKA   . HTN (hypertension)   . Hyperlipidemia   . Venous stasis ulcers   . Arthritis   . Depression     anxiety  . 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 Hypertension   . Coronary artery disease     angina.  MI.   . Pneumonia        . GERD (gastroesophageal reflux disease)     Hiatal hernia.   . Hypothyroidism   . CHF (congestive heart failure)   . Tuberculosis   . Osteoporosis     Past Surgical History  Procedure Laterality Date  . Cardiac catheterization    . Tonsillectomy    . Coronary artery bypass graft      Dr. Prescott Gum in 09/2011   . Coronary artery bypass graft  2012  . Esophagogastroduodenoscopy N/A 11/17/2013    Procedure: ESOPHAGOGASTRODUODENOSCOPY (EGD);  Surgeon: Lafayette Dragon, MD;  Location: [facility] ENDOSCOPY;  Service: Endoscopy;  Laterality: N/A;  . Colonoscopy N/A 11/18/2013    Procedure: COLONOSCOPY;  Surgeon: Lafayette Dragon, MD;  Location: [facility] ENDOSCOPY;  Service: Endoscopy;  Laterality: N/A;  . Right heart catheterization N/A 07/29/2011    Procedure: RIGHT HEART CATH;  Surgeon: Minus Breeding, MD;  Location: [facility] CATH LAB;  Service: Cardiovascular;  Laterality: N/A;  . Left heart catheterization with coronary angiogram N/A 07/26/2011    Procedure: LEFT HEART CATHETERIZATION WITH CORONARY ANGIOGRAM;  Surgeon: Larey Dresser, MD;  Location: [facility] CATH LAB;  Service: Cardiovascular;  Laterality: N/A;    Family History  Problem Relation Age of Onset  . Heart disease Father   . Multiple sclerosis Father   . Hypertension Mother   . Hyperthyroidism Mother   . Diabetes Cousin     Multiple maternal cousins with type 2 diabetes mellitus  . Diabetes Maternal Uncle     Type 1 diabetes mellitus  . Heart attack Paternal Grandfather   . Heart disease Paternal Grandfather     Social History:  reports that she has never smoked. She does not have any smokeless tobacco history on file. She reports that she does not drink alcohol or use illicit drugs.  Allergies:  Allergies  Allergen Reactions  . Sulfa Antibiotics Anaphylaxis and Swelling    Stiff neck also     Medications: I have reviewed the patient's current medications.  Results for orders placed or performed during the hospital encounter of 11/19/14 (from the past 48 hour(s))  CBG monitoring, ED     Status: Abnormal   Collection Time: 11/19/14  9:57 PM  Result Value Ref Range   Glucose-Capillary 119 (H) 70 - 99 mg/dL  I-stat troponin, ED     Status: None   Collection Time: 11/19/14 11:05 PM  Result  Value Ref Range   Troponin i, poc 0.03 0.00 - 0.08 ng/mL   Comment 3            Comment: Due to the release kinetics of cTnI, a negative result within the first hours of the onset of symptoms does not rule out myocardial infarction with certainty. If myocardial infarction is still suspected, repeat the test at appropriate intervals.   I-stat chem 8, ed     Status: Abnormal   Collection Time: 11/19/14 11:06 PM  Result Value Ref Range   Sodium 136 135 - 145 mmol/L   Potassium 2.9 (L) 3.5 - 5.1 mmol/L   Chloride 100 96 - 112 mmol/L   BUN 14 6 - 23 mg/dL   Creatinine, Ser 1.30 (H) 0.50 - 1.10 mg/dL   Glucose, Bld 151 (H) 70 - 99 mg/dL   Calcium, Ion 1.16 1.13 - 1.30 mmol/L   TCO2 20 0 - 100 mmol/L   Hemoglobin 9.9 (L) 12.0 - 15.0 g/dL   HCT 29.0 (L)  36.0 - 46.0 %  Protime-INR     Status: None   Collection Time: 11/20/14  1:50 AM  Result Value Ref Range   Prothrombin Time 15.0 11.6 - 15.2 seconds   INR 1.16 0.00 - 1.49  APTT     Status: None   Collection Time: 11/20/14  1:50 AM  Result Value Ref Range   aPTT 27 24 - 37 seconds  Type and screen     Status: None   Collection Time: 11/20/14  1:50 AM  Result Value Ref Range   ABO/RH(D) B POS    Antibody Screen NEG    Sample Expiration 11/23/2014   Brain natriuretic peptide     Status: Abnormal   Collection Time: 11/20/14  1:50 AM  Result Value Ref Range   B Natriuretic Peptide 321.3 (H) 0.0 - 100.0 pg/mL  CBC with Differential/Platelet     Status: Abnormal   Collection Time: 11/20/14  1:50 AM  Result Value Ref Range   WBC 12.7 (H) 4.0 - 10.5 K/uL   RBC 2.69 (L) 3.87 - 5.11 MIL/uL   Hemoglobin 8.3 (L) 12.0 - 15.0 g/dL   HCT 24.4 (L) 36.0 - 46.0 %   MCV 90.7 78.0 - 100.0 fL   MCH 30.9 26.0 - 34.0 pg   MCHC 34.0 30.0 - 36.0 g/dL   RDW 14.5 11.5 - 15.5 %   Platelets 277 150 - 400 K/uL   Neutrophils Relative % 85 (H) 43 - 77 %   Neutro Abs 10.9 (H) 1.7 - 7.7 K/uL   Lymphocytes Relative 8 (L) 12 - 46 %   Lymphs Abs 1.0 0.7 - 4.0 K/uL   Monocytes Relative 6 3 - 12 %   Monocytes Absolute 0.8 0.1 - 1.0 K/uL   Eosinophils Relative 1 0 - 5 %   Eosinophils Absolute 0.1 0.0 - 0.7 K/uL   Basophils Relative 0 0 - 1 %   Basophils Absolute 0.0 0.0 - 0.1 K/uL  Glucose, capillary     Status: Abnormal   Collection Time: 11/20/14  1:55 AM  Result Value Ref Range   Glucose-Capillary 310 (H) 70 - 99 mg/dL  Comprehensive metabolic panel     Status: Abnormal   Collection Time: 11/20/14  5:35 AM  Result Value Ref Range   Sodium 133 (L) 135 - 145 mmol/L   Potassium 3.9 3.5 - 5.1 mmol/L    Comment: RESULT REPEATED AND VERIFIED DELTA CHECK NOTED  Chloride 100 96 - 112 mmol/L   CO2 15 (L) 19 - 32 mmol/L   Glucose, Bld 427 (H) 70 - 99 mg/dL   BUN 18 6 - 23 mg/dL   Creatinine, Ser 1.48  (H) 0.50 - 1.10 mg/dL   Calcium 8.8 8.4 - 10.5 mg/dL   Total Protein 6.4 6.0 - 8.3 g/dL   Albumin 3.7 3.5 - 5.2 g/dL   AST 18 0 - 37 U/L   ALT 18 0 - 35 U/L   Alkaline Phosphatase 100 39 - 117 U/L   Total Bilirubin 1.6 (H) 0.3 - 1.2 mg/dL   GFR calc non Af Amer 36 (L) >90 mL/min   GFR calc Af Amer 42 (L) >90 mL/min    Comment: (NOTE) The eGFR has been calculated using the CKD EPI equation. This calculation has not been validated in all clinical situations. eGFR's persistently <90 mL/min signify possible Chronic Kidney Disease.    Anion gap 18 (H) 5 - 15  CBC     Status: Abnormal   Collection Time: 11/20/14  5:35 AM  Result Value Ref Range   WBC 11.7 (H) 4.0 - 10.5 K/uL   RBC 2.69 (L) 3.87 - 5.11 MIL/uL   Hemoglobin 8.1 (L) 12.0 - 15.0 g/dL   HCT 24.6 (L) 36.0 - 46.0 %   MCV 91.4 78.0 - 100.0 fL   MCH 30.1 26.0 - 34.0 pg   MCHC 32.9 30.0 - 36.0 g/dL   RDW 14.7 11.5 - 15.5 %   Platelets 275 150 - 400 K/uL  Magnesium     Status: None   Collection Time: 11/20/14  5:35 AM  Result Value Ref Range   Magnesium 1.5 1.5 - 2.5 mg/dL  Glucose, capillary     Status: Abnormal   Collection Time: 11/20/14  7:39 AM  Result Value Ref Range   Glucose-Capillary 487 (H) 70 - 99 mg/dL   Comment 1 Notify RN    Comment 2 Document in Chart     Ct Head Wo Contrast  11/19/2014   CLINICAL DATA:  Head injury after fall. Possible loss of consciousness.  EXAM: CT HEAD WITHOUT CONTRAST  TECHNIQUE: Contiguous axial images were obtained from the base of the skull through the vertex without intravenous contrast.  COMPARISON:  07/26/2014  FINDINGS: No intracranial hemorrhage, mass effect, or midline shift. No hydrocephalus. The basilar cisterns are patent. No evidence of territorial infarct. No intracranial fluid collection. Mild generalized atrophy. Calvarium is intact. Unchanged chronic calcification adjacent to the inner table of left frontal skull. Included paranasal sinuses and mastoid air cells are well  aerated.  IMPRESSION: No acute intracranial abnormality.   Electronically Signed   By: Jeb Levering M.D.   On: 11/19/2014 22:49   Dg Hips Bilat With Pelvis Min 5 Views  11/19/2014   CLINICAL DATA:  Status post fall, with bilateral hip pain, and bruising about the hips and groin. Initial encounter.  EXAM: BILATERAL HIP (WITH PELVIS) 5-6 VIEWS  COMPARISON:  None.  FINDINGS: There is a mildly displaced transcervical fracture through the left femoral neck, with superior displacement of the distal femur and mild rotation of the left femoral head. No additional fractures are seen. Both femoral heads remain seated within their respective acetabuli. The right hip joint is grossly unremarkable in appearance.  Mild sclerotic change is seen at the sacroiliac joints. Clips are noted overlying the right inguinal region. Scattered vascular calcifications are seen. The visualized bowel gas pattern is grossly unremarkable.  IMPRESSION: 1.  Mildly displaced transcervical fracture through the left femoral neck. 2. Scattered vascular calcifications seen.   Electronically Signed   By: Garald Balding M.D.   On: 11/19/2014 22:54    ROS Blood pressure 125/49, pulse 83, temperature 98.9 F (37.2 C), temperature source Oral, resp. rate 18, height 5' 3.5" (1.613 m), weight 58 kg (127 lb 13.9 oz), SpO2 100 %. Physical Exam  Musculoskeletal:       Left hip: She exhibits decreased range of motion, decreased strength, bony tenderness and deformity.  Her left leg is shortened and rotated consistent with a hip fracture  Assessment/Plan: Displaced left hip femoral neck fracture status-post mechanical fall 1)  I have spoken to her and her family at the bedside.  We plan on proceeding to the OR later this am for a left total hip replacement (direct anterior).  She understands this fully as well as the risks and benefits involved.  I have spoken to the primary team and she will get a unit of blood this am as well due to her chronic  anemia and due to the anticipated blood loss from surgery.  Tobias Avitabile Y 11/20/2014, 8:22 AM

## 2014-11-20 NOTE — Progress Notes (Signed)
Peripherally Inserted Central Catheter/Midline Placement  The IV Nurse has discussed with the patient and/or persons authorized to consent for the patient, the purpose of this procedure and the potential benefits and risks involved with this procedure.  The benefits include less needle sticks, lab draws from the catheter and patient may be discharged home with the catheter.  Risks include, but not limited to, infection, bleeding, blood clot (thrombus formation), and puncture of an artery; nerve damage and irregular heat beat.  Alternatives to this procedure were also discussed.  PICC/Midline Placement Documentation  PICC / Midline Double Lumen [id number] PICC Right Basilic 38 cm 0 cm (Active)  Indication for Insertion or Continuance of Line Poor Vasculature-patient has had multiple peripheral attempts or PIVs lasting less than 24 hours 11/20/2014  6:06 PM  Exposed Catheter (cm) 0 cm 11/20/2014  6:06 PM  Lumen #1 Status Flushed;Saline locked;Blood return noted 11/20/2014  6:06 PM  Lumen #2 Status Flushed;Saline locked;Blood return noted 11/20/2014  6:06 PM  Dressing Change Due 11/27/14 11/20/2014  6:06 PM       Gordan Payment 11/20/2014, 6:07 PM

## 2014-11-20 NOTE — Progress Notes (Signed)
PT Cancellation Note  Patient Details Name: Bridget Martinez MRN: [id number] DOB: [birth year]   Cancelled Treatment:    Reason Eval/Treat Not Completed: Medical issues which prohibited therapy (pt is scheduled for hip surgery later today. Will check on pt tomorrow. )   Philomena Doheny 11/20/2014, 9:36 AM  [phone]

## 2014-11-20 NOTE — Anesthesia Preprocedure Evaluation (Addendum)
Anesthesia Evaluation  Patient identified by MRN, date of birth, ID band Patient awake    Reviewed: Allergy & Precautions, H&P , NPO status , Patient's Chart, lab work & pertinent test results, reviewed documented beta blocker date and time   Airway Mallampati: II  TM Distance: >3 FB Neck ROM: full    Dental no notable dental hx.    Pulmonary neg pulmonary ROS,  breath sounds clear to auscultation  Pulmonary exam normal       Cardiovascular hypertension, Pt. on home beta blockers and Pt. on medications + CAD, + Past MI, + CABG and +CHF Rhythm:regular Rate:Normal  NSTEMI 11/12. CABG 1/13. 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  ECHO 10/2014 EF 35%   Neuro/Psych negative neurological ROS  negative psych ROS   GI/Hepatic negative GI ROS, Neg liver ROS, GERD-  Medicated and Controlled,  Endo/Other  diabetes, Poorly Controlled, Type 2, Oral Hypoglycemic AgentsHypothyroidism   Renal/GU Renal diseasenegative Renal ROSStage 3 chronic kidney disease Cr 1,48  negative genitourinary   Musculoskeletal   Abdominal   Peds  Hematology negative hematology ROS (+) anemia , hgb 8.1   Anesthesia Other Findings   Reproductive/Obstetrics negative OB ROS                          Anesthesia Physical Anesthesia Plan  ASA: IV  Anesthesia Plan: General   Post-op Pain Management:    Induction: Intravenous  Airway Management Planned: Oral ETT  Additional Equipment:   Intra-op Plan:   Post-operative Plan: Extubation in OR  Informed Consent: I have reviewed the patients History and Physical, chart, labs and discussed the procedure including the risks, benefits and alternatives for the proposed anesthesia with the patient or authorized representative who has indicated his/her understanding and acceptance.   Dental Advisory Given  Plan Discussed with: CRNA and Surgeon  Anesthesia Plan Comments:          Anesthesia Quick Evaluation

## 2014-11-21 ENCOUNTER — Inpatient Hospital Stay (HOSPITAL_COMMUNITY): Payer: BC Managed Care – PPO | Admitting: Certified Registered Nurse Anesthetist

## 2014-11-21 ENCOUNTER — Inpatient Hospital Stay (HOSPITAL_COMMUNITY): Payer: BC Managed Care – PPO

## 2014-11-21 ENCOUNTER — Encounter (HOSPITAL_COMMUNITY)
Admission: EM | Disposition: A | Discharge status: Skilled Nursing Facility | Payer: Self-pay | Source: Home / Self Care | Attending: Internal Medicine

## 2014-11-21 ENCOUNTER — Encounter (HOSPITAL_COMMUNITY): Payer: Self-pay | Admitting: Anesthesiology

## 2014-11-21 HISTORY — PX: TOTAL HIP ARTHROPLASTY: SHX124

## 2014-11-21 LAB — BASIC METABOLIC PANEL
Anion gap: 8 (ref 5–15)
BUN: 16 mg/dL (ref 6–23)
CO2: 25 mmol/L (ref 19–32)
Calcium: 9 mg/dL (ref 8.4–10.5)
Chloride: 103 mmol/L (ref 96–112)
Creatinine, Ser: 1.56 mg/dL — ABNORMAL HIGH (ref 0.50–1.10)
GFR, EST AFRICAN AMERICAN: 39 mL/min — AB (ref >90)
GFR, EST NON AFRICAN AMERICAN: 34 mL/min — AB (ref >90)
Glucose, Bld: 186 mg/dL — ABNORMAL HIGH (ref 70–99)
POTASSIUM: 4.5 mmol/L (ref 3.5–5.1)
SODIUM: 136 mmol/L (ref 135–145)

## 2014-11-21 LAB — GLUCOSE, CAPILLARY
GLUCOSE-CAPILLARY: 135 mg/dL — AB (ref 70–99)
GLUCOSE-CAPILLARY: 332 mg/dL — AB (ref 70–99)
GLUCOSE-CAPILLARY: 342 mg/dL — AB (ref 70–99)
Glucose-Capillary: 407 mg/dL — ABNORMAL HIGH (ref 70–99)
Glucose-Capillary: 135 mg/dL — ABNORMAL HIGH (ref 70–99)
Glucose-Capillary: 222 mg/dL — ABNORMAL HIGH (ref 70–99)
Glucose-Capillary: 178 mg/dL — ABNORMAL HIGH (ref 70–99)
Glucose-Capillary: 153 mg/dL — ABNORMAL HIGH (ref 70–99)
Glucose-Capillary: 85 mg/dL (ref 70–99)
Glucose-Capillary: 110 mg/dL — ABNORMAL HIGH (ref 70–99)
Glucose-Capillary: 163 mg/dL — ABNORMAL HIGH (ref 70–99)

## 2014-11-21 LAB — HEMOGLOBIN A1C
Hgb A1c MFr Bld: 9.2 % — ABNORMAL HIGH (ref 4.8–5.6)
Mean Plasma Glucose: 217 mg/dL

## 2014-11-21 LAB — CBC
HCT: 27.3 % — ABNORMAL LOW (ref 36.0–46.0)
HEMOGLOBIN: 9.2 g/dL — AB (ref 12.0–15.0)
MCH: 29.9 pg (ref 26.0–34.0)
MCHC: 33.7 g/dL (ref 30.0–36.0)
MCV: 88.6 fL (ref 78.0–100.0)
Platelets: 235 10*3/uL (ref 150–400)
RBC: 3.08 MIL/uL — ABNORMAL LOW (ref 3.87–5.11)
RDW: 14.6 % (ref 11.5–15.5)
WBC: 9.1 10*3/uL (ref 4.0–10.5)

## 2014-11-21 LAB — TYPE AND SCREEN
ABO/RH(D): B POS
Antibody Screen: NEGATIVE
Unit division: 0

## 2014-11-21 LAB — PREPARE RBC (CROSSMATCH)

## 2014-11-21 SURGERY — ARTHROPLASTY, HIP, TOTAL, ANTERIOR APPROACH
Anesthesia: General | Site: Hip | Laterality: Left

## 2014-11-21 MED ORDER — SODIUM CHLORIDE 0.9 % IV SOLN: Freq: Once | INTRAVENOUS | Status: DC

## 2014-11-21 MED ORDER — METOCLOPRAMIDE HCL 10 MG PO TABS
5.0000 mg | ORAL_TABLET | Freq: Three times a day (TID) | ORAL | Status: DC | PRN
  Administered 2014-11-22 (×2): 10 mg via ORAL
  Filled 2014-11-21 (×2): qty 1

## 2014-11-21 MED ORDER — EPHEDRINE SULFATE 50 MG/ML IJ SOLN
INTRAMUSCULAR | Status: AC
  Filled 2014-11-21: qty 1

## 2014-11-21 MED ORDER — 0.9 % SODIUM CHLORIDE (POUR BTL) OPTIME
TOPICAL | Status: DC | PRN
  Administered 2014-11-21: 1000 mL

## 2014-11-21 MED ORDER — METHOCARBAMOL 500 MG PO TABS
500.0000 mg | ORAL_TABLET | Freq: Four times a day (QID) | ORAL | Status: DC | PRN
  Administered 2014-11-21 – 2014-11-22 (×2): 500 mg via ORAL
  Filled 2014-11-21 (×2): qty 1

## 2014-11-21 MED ORDER — FENTANYL CITRATE 0.05 MG/ML IJ SOLN
INTRAMUSCULAR | Status: AC
  Filled 2014-11-21: qty 5

## 2014-11-21 MED ORDER — CEFAZOLIN SODIUM 1-5 GM-% IV SOLN
1.0000 g | Freq: Four times a day (QID) | INTRAVENOUS | Status: AC
  Administered 2014-11-21 – 2014-11-22 (×2): 1 g via INTRAVENOUS
  Filled 2014-11-21 (×3): qty 50

## 2014-11-21 MED ORDER — PROPOFOL 10 MG/ML IV BOLUS
INTRAVENOUS | Status: AC
  Filled 2014-11-21: qty 20

## 2014-11-21 MED ORDER — LACTATED RINGERS IV SOLN
INTRAVENOUS | Status: DC | PRN
  Administered 2014-11-21: 14:00:00 via INTRAVENOUS

## 2014-11-21 MED ORDER — NEOSTIGMINE METHYLSULFATE 10 MG/10ML IV SOLN
INTRAVENOUS | Status: AC
  Filled 2014-11-21: qty 1

## 2014-11-21 MED ORDER — ACETAMINOPHEN 325 MG PO TABS: 650.0000 mg | ORAL_TABLET | Freq: Four times a day (QID) | ORAL | Status: DC | PRN

## 2014-11-21 MED ORDER — SUCCINYLCHOLINE CHLORIDE 20 MG/ML IJ SOLN
INTRAMUSCULAR | Status: AC
  Filled 2014-11-21: qty 1

## 2014-11-21 MED ORDER — SODIUM CHLORIDE 0.9 % IV SOLN: INTRAVENOUS | Status: DC

## 2014-11-21 MED ORDER — DOCUSATE SODIUM 100 MG PO CAPS
100.0000 mg | ORAL_CAPSULE | Freq: Two times a day (BID) | ORAL | Status: DC
  Administered 2014-11-21 – 2014-11-23 (×5): 100 mg via ORAL
  Filled 2014-11-21 (×5): qty 1

## 2014-11-21 MED ORDER — ONDANSETRON HCL 4 MG PO TABS: 4.0000 mg | ORAL_TABLET | Freq: Four times a day (QID) | ORAL | Status: DC | PRN

## 2014-11-21 MED ORDER — ACETAMINOPHEN 650 MG RE SUPP
650.0000 mg | Freq: Four times a day (QID) | RECTAL | Status: DC | PRN
Start: 2014-11-21 — End: 2014-11-28

## 2014-11-21 MED ORDER — ONDANSETRON HCL 4 MG/2ML IJ SOLN: 4.0000 mg | Freq: Four times a day (QID) | INTRAMUSCULAR | Status: DC | PRN

## 2014-11-21 MED ORDER — CEFAZOLIN SODIUM-DEXTROSE 2-3 GM-% IV SOLR
INTRAVENOUS | Status: DC | PRN
  Administered 2014-11-21: 2 g via INTRAVENOUS

## 2014-11-21 MED ORDER — SODIUM CHLORIDE 0.9 % IV SOLN
INTRAVENOUS | Status: DC
  Administered 2014-11-21: 13:00:00 via INTRAVENOUS

## 2014-11-21 MED ORDER — MIDAZOLAM HCL 5 MG/5ML IJ SOLN
INTRAMUSCULAR | Status: DC | PRN
  Administered 2014-11-21: 1 mg via INTRAVENOUS

## 2014-11-21 MED ORDER — METOCLOPRAMIDE HCL 5 MG/ML IJ SOLN: 5.0000 mg | Freq: Three times a day (TID) | INTRAMUSCULAR | Status: DC | PRN

## 2014-11-21 MED ORDER — SODIUM CHLORIDE 0.9 % IR SOLN
Status: DC | PRN
  Administered 2014-11-21: 3000 mL

## 2014-11-21 MED ORDER — ASPIRIN EC 325 MG PO TBEC
325.0000 mg | DELAYED_RELEASE_TABLET | Freq: Two times a day (BID) | ORAL | Status: DC
  Administered 2014-11-22 – 2014-11-23 (×4): 325 mg via ORAL
  Filled 2014-11-21 (×4): qty 1

## 2014-11-21 MED ORDER — PHENOL 1.4 % MT LIQD: 1.0000 | OROMUCOSAL | Status: DC | PRN

## 2014-11-21 MED ORDER — LIDOCAINE HCL (CARDIAC) 20 MG/ML IV SOLN
INTRAVENOUS | Status: AC
  Filled 2014-11-21: qty 5

## 2014-11-21 MED ORDER — SODIUM CHLORIDE 0.9 % IV SOLN
INTRAVENOUS | Status: AC
  Administered 2014-11-21: 3.8 [IU]/h via INTRAVENOUS
  Filled 2014-11-21: qty 2.5

## 2014-11-21 MED ORDER — MENTHOL 3 MG MT LOZG: 1.0000 | LOZENGE | OROMUCOSAL | Status: DC | PRN

## 2014-11-21 MED ORDER — ACETAMINOPHEN 10 MG/ML IV SOLN
INTRAVENOUS | Status: AC
  Filled 2014-11-21: qty 100

## 2014-11-21 MED ORDER — MIDAZOLAM HCL 2 MG/2ML IJ SOLN
INTRAMUSCULAR | Status: AC
  Filled 2014-11-21: qty 2

## 2014-11-21 MED ORDER — PHENYLEPHRINE HCL 10 MG/ML IJ SOLN
INTRAMUSCULAR | Status: DC | PRN
  Administered 2014-11-21: 80 ug via INTRAVENOUS

## 2014-11-21 MED ORDER — ROCURONIUM BROMIDE 50 MG/5ML IV SOLN
INTRAVENOUS | Status: AC
  Filled 2014-11-21: qty 1

## 2014-11-21 MED ORDER — OXYCODONE HCL 5 MG PO TABS
5.0000 mg | ORAL_TABLET | ORAL | Status: DC | PRN
  Administered 2014-11-21 – 2014-11-22 (×2): 10 mg via ORAL
  Filled 2014-11-21 (×2): qty 2

## 2014-11-21 MED ORDER — UNJURY CHICKEN SOUP POWDER
1.0000 | Freq: Two times a day (BID) | ORAL | Status: DC
  Administered 2014-11-22 – 2014-11-28 (×8): 1 via ORAL
  Filled 2014-11-21 (×14): qty 27

## 2014-11-21 MED ORDER — SODIUM CHLORIDE 0.9 % IV SOLN
INTRAVENOUS | Status: DC
  Filled 2014-11-21: qty 2.5

## 2014-11-21 MED ORDER — FENTANYL CITRATE 0.05 MG/ML IJ SOLN
INTRAMUSCULAR | Status: DC | PRN
  Administered 2014-11-21 (×2): 50 ug via INTRAVENOUS

## 2014-11-21 MED ORDER — PHENYLEPHRINE HCL 10 MG/ML IJ SOLN
10.0000 mg | INTRAMUSCULAR | Status: DC | PRN
  Administered 2014-11-21: 25 ug/min via INTRAVENOUS

## 2014-11-21 MED ORDER — METHOCARBAMOL 1000 MG/10ML IJ SOLN
500.0000 mg | Freq: Four times a day (QID) | INTRAVENOUS | Status: DC | PRN
  Filled 2014-11-21: qty 5

## 2014-11-21 MED ORDER — HYDROMORPHONE HCL 1 MG/ML IJ SOLN: 0.5000 mg | INTRAMUSCULAR | Status: DC | PRN

## 2014-11-21 MED ORDER — ACETAMINOPHEN 10 MG/ML IV SOLN
INTRAVENOUS | Status: DC | PRN
  Administered 2014-11-21: 1000 mg via INTRAVENOUS

## 2014-11-21 MED ORDER — GLYCOPYRROLATE 0.2 MG/ML IJ SOLN
INTRAMUSCULAR | Status: AC
  Filled 2014-11-21: qty 2

## 2014-11-21 MED ORDER — DIPHENHYDRAMINE HCL 12.5 MG/5ML PO ELIX: 12.5000 mg | ORAL_SOLUTION | ORAL | Status: DC | PRN

## 2014-11-21 MED ORDER — STERILE WATER FOR INJECTION IJ SOLN
INTRAMUSCULAR | Status: AC
  Filled 2014-11-21: qty 10

## 2014-11-21 SURGICAL SUPPLY — 58 items
APL SKNCLS STERI-STRIP NONHPOA (GAUZE/BANDAGES/DRESSINGS) ×1
BENZOIN TINCTURE PRP APPL 2/3 (GAUZE/BANDAGES/DRESSINGS) ×3 IMPLANT
BLADE SAW SGTL 18X1.27X75 (BLADE) ×2 IMPLANT
BLADE SAW SGTL 18X1.27X75MM (BLADE) ×1
BLADE SURG ROTATE 9660 (MISCELLANEOUS) IMPLANT
BNDG GAUZE ELAST 4 BULKY (GAUZE/BANDAGES/DRESSINGS) IMPLANT
CAPT HIP TOTAL 2 ×2 IMPLANT
CELLS DAT CNTRL 66122 CELL SVR (MISCELLANEOUS) ×1 IMPLANT
CLOSURE WOUND 1/2 X4 (GAUZE/BANDAGES/DRESSINGS) ×2
COVER SURGICAL LIGHT HANDLE (MISCELLANEOUS) ×3 IMPLANT
DRAPE C-ARM 42X72 X-RAY (DRAPES) ×3 IMPLANT
DRAPE IMP U-DRAPE 54X76 (DRAPES) ×3 IMPLANT
DRAPE STERI IOBAN 125X83 (DRAPES) ×3 IMPLANT
DRAPE U-SHAPE 47X51 STRL (DRAPES) ×9 IMPLANT
DRSG AQUACEL AG ADV 3.5X10 (GAUZE/BANDAGES/DRESSINGS) ×3 IMPLANT
DURAPREP 26ML APPLICATOR (WOUND CARE) ×3 IMPLANT
ELECT BLADE 4.0 EZ CLEAN MEGAD (MISCELLANEOUS)
ELECT BLADE 6.5 EXT (BLADE) IMPLANT
ELECT CAUTERY BLADE 6.4 (BLADE) ×3 IMPLANT
ELECT REM PT RETURN 9FT ADLT (ELECTROSURGICAL) ×3
ELECTRODE BLDE 4.0 EZ CLN MEGD (MISCELLANEOUS) IMPLANT
ELECTRODE REM PT RTRN 9FT ADLT (ELECTROSURGICAL) ×1 IMPLANT
FACESHIELD WRAPAROUND (MASK) ×6 IMPLANT
FACESHIELD WRAPAROUND OR TEAM (MASK) ×2 IMPLANT
GAUZE XEROFORM 1X8 LF (GAUZE/BANDAGES/DRESSINGS) ×2 IMPLANT
GLOVE BIOGEL PI IND STRL 8 (GLOVE) ×2 IMPLANT
GLOVE BIOGEL PI INDICATOR 8 (GLOVE) ×4
GLOVE ECLIPSE 8.0 STRL XLNG CF (GLOVE) ×3 IMPLANT
GLOVE ORTHO TXT STRL SZ7.5 (GLOVE) ×6 IMPLANT
GOWN STRL REUS W/ TWL XL LVL3 (GOWN DISPOSABLE) ×2 IMPLANT
GOWN STRL REUS W/TWL XL LVL3 (GOWN DISPOSABLE) ×6
HANDPIECE INTERPULSE COAX TIP (DISPOSABLE) ×3
KIT BASIN OR (CUSTOM PROCEDURE TRAY) ×3 IMPLANT
KIT ROOM TURNOVER OR (KITS) ×3 IMPLANT
MANIFOLD NEPTUNE II (INSTRUMENTS) ×3 IMPLANT
NS IRRIG 1000ML POUR BTL (IV SOLUTION) ×3 IMPLANT
PACK TOTAL JOINT (CUSTOM PROCEDURE TRAY) ×3 IMPLANT
PACK UNIVERSAL I (CUSTOM PROCEDURE TRAY) ×3 IMPLANT
PAD ARMBOARD 7.5X6 YLW CONV (MISCELLANEOUS) ×6 IMPLANT
RETRACTOR WND ALEXIS 18 MED (MISCELLANEOUS) ×1 IMPLANT
RTRCTR WOUND ALEXIS 18CM MED (MISCELLANEOUS) ×3
SET HNDPC FAN SPRY TIP SCT (DISPOSABLE) ×1 IMPLANT
SPONGE LAP 18X18 X RAY DECT (DISPOSABLE) IMPLANT
SPONGE LAP 4X18 X RAY DECT (DISPOSABLE) IMPLANT
STAPLER VISISTAT 35W (STAPLE) ×2 IMPLANT
STRIP CLOSURE SKIN 1/2X4 (GAUZE/BANDAGES/DRESSINGS) ×4 IMPLANT
SUT ETHIBOND NAB CT1 #1 30IN (SUTURE) ×3 IMPLANT
SUT MNCRL AB 4-0 PS2 18 (SUTURE) ×3 IMPLANT
SUT VIC AB 0 CT1 27 (SUTURE) ×3
SUT VIC AB 0 CT1 27XBRD ANBCTR (SUTURE) ×1 IMPLANT
SUT VIC AB 1 CT1 27 (SUTURE) ×3
SUT VIC AB 1 CT1 27XBRD ANBCTR (SUTURE) ×1 IMPLANT
SUT VIC AB 2-0 CT1 27 (SUTURE) ×3
SUT VIC AB 2-0 CT1 TAPERPNT 27 (SUTURE) ×1 IMPLANT
TOWEL OR 17X24 6PK STRL BLUE (TOWEL DISPOSABLE) ×3 IMPLANT
TOWEL OR 17X26 10 PK STRL BLUE (TOWEL DISPOSABLE) ×3 IMPLANT
TRAY FOLEY CATH 16FRSI W/METER (SET/KITS/TRAYS/PACK) IMPLANT
WATER STERILE IRR 1000ML POUR (IV SOLUTION) ×6 IMPLANT

## 2014-11-21 NOTE — Progress Notes (Signed)
INITIAL NUTRITION ASSESSMENT  Pt meets criteria for SEVERE MALNUTRITION in the context of chronic illness as evidenced by 8.7% weight loss in 1 month and energy intake </= 75% for >/= 1 month.  DOCUMENTATION CODES Per approved criteria  -Severe malnutrition in the context of chronic illness   INTERVENTION: Discontinue Ensure.  Once diet advances, Provide Unjury (1 packet) po BID, each dose provides 100 kcal and 21 grams of protein.  Once diet advances, Provide Magic cup BID between meals, each supplement provides 290 kcal and 9 grams of protein  NUTRITION DIAGNOSIS: Increased nutrient needs related to surgery as evidenced by estimated nutrition needs.  Goal: Pt to meet >/= 90% of their estimated nutrition needs   Monitor:  Diet advancement, weight trends, labs, I/O's  Reason for Assessment: MST  64 y.o. female  Admitting Dx: Left displaced femoral neck fracture  ASSESSMENT: Pt with past medical history of hypertension, hyperlipidemia, GERD, diabetes type 1, coronary artery disease (post the status of CABG 2013), hypothyroidism, chronic kidney disease-stage III, systolic and diastolic combined congestive heart failure, who presents with left hip pain after fall.  Pt is currently NPO for surgery today. Pt reports having a decreased appetite which has been ongoing over the past 1 month. She reports she has been consuming 2 meals a day instead of the usual 3 meals a day. Current meal completion has been 50-100%. Per Epic weight records, pt with a 8.7% weight loss in 1 month. Pt currently has Ensure ordered. She reports the "milkshake" drinks causes n/v. RD to discontinue. Pt is agreeable to Unjury and Magic cup. RD to order.   Pt with mild to moderate muscle mass loss in the anterior thigh region. No other areas were observed significant fat or muscle mass loss.  Labs: Low GFR. High creatinine.  Height: Ht Readings from Last 1 Encounters:  11/20/14 5' 3.5" (1.613 m)     Weight: Wt Readings from Last 1 Encounters:  11/21/14 136 lb 9.6 oz (61.961 kg)    Ideal Body Weight: 118 lbs  % Ideal Body Weight: 115%  Wt Readings from Last 10 Encounters:  11/21/14 136 lb 9.6 oz (61.961 kg)  11/05/14 149 lb 7.6 oz (67.8 kg)  10/24/14 153 lb 3.5 oz (69.5 kg)  10/08/14 145 lb 3.2 oz (65.862 kg)  09/21/14 161 lb 13.1 oz (73.4 kg)  07/28/14 184 lb 1.4 oz (83.5 kg)  12/02/13 160 lb 12.8 oz (72.938 kg)  11/30/13 159 lb 6.3 oz (72.3 kg)  11/18/13 157 lb 3 oz (71.3 kg)  11/10/13 165 lb (74.844 kg)    Usual Body Weight: 148 lbs (per pt report)  % Usual Body Weight: 92%  BMI:  Body mass index is 23.81 kg/(m^2).  Estimated Nutritional Needs: Kcal: 1800-2000 Protein: 80-100 grams Fluid: 1.8 - 2 L/day  Skin: Laceration on head, non-pitting UE and LE edema  Diet Order: Diet NPO time specified  EDUCATION NEEDS: -No education needs identified at this time   Intake/Output Summary (Last 24 hours) at 11/21/14 1000 Last data filed at 11/21/14 0336  Gross per 24 hour  Intake    470 ml  Output   1425 ml  Net   -955 ml    Last BM: 3/12  Labs:   Recent Labs Lab 11/20/14 0535 11/20/14 1134 11/21/14 0225  NA 133* 137 136  K 3.9 2.8* 4.5  CL 100 106 103  CO2 15* 17* 25  BUN 18 19 16   CREATININE 1.48* 1.63* 1.56*  CALCIUM 8.8  9.1 9.0  MG 1.5  --   --   GLUCOSE 427* 275* 186*    CBG (last 3)   Recent Labs  11/21/14 0100 11/21/14 0419 11/21/14 0754  GLUCAP 135* 222* 332*    Scheduled Meds: . aspirin EC  325 mg Oral Daily  . carvedilol  3.125 mg Oral BID WC  . escitalopram  10 mg Oral Daily  . feeding supplement (ENSURE COMPLETE)  237 mL Oral BID BM  . heparin  5,000 Units Subcutaneous 3 times per day  . insulin aspart  0-5 Units Subcutaneous QHS  . insulin aspart  0-9 Units Subcutaneous TID WC  . insulin aspart  3 Units Subcutaneous TID WC  . insulin detemir  7 Units Subcutaneous QHS  . levothyroxine  125 mcg Oral QAC breakfast   . metoCLOPramide  5 mg Oral TID AC & HS  . pantoprazole (PROTONIX) IV  40 mg Intravenous Q12H  . protein supplement  2 oz Oral TID  . rosuvastatin  40 mg Oral Daily  . sodium chloride  10-40 mL Intracatheter Q12H  . sodium chloride  3 mL Intravenous Q12H  . spironolactone  25 mg Oral Daily    Continuous Infusions: . lactated ringers 125 mL/hr at 11/20/14 2037    Past Medical History  Diagnosis Date  . Diabetes mellitus     on insulin, with h/o DKA   . HTN (hypertension)   . Hyperlipidemia   . Venous stasis ulcers   . Arthritis   . Depression     anxiety  . 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 Hypertension   . Coronary artery disease     angina.  MI.   . Pneumonia        . GERD (gastroesophageal reflux disease)     Hiatal hernia.   . Hypothyroidism   . CHF (congestive heart failure)   . Tuberculosis   . Osteoporosis     Past Surgical History  Procedure Laterality Date  . Cardiac catheterization    . Tonsillectomy    . Coronary artery bypass graft      Dr. Prescott Gum in 09/2011   . Coronary artery bypass graft  2012  . Esophagogastroduodenoscopy N/A 11/17/2013    Procedure: ESOPHAGOGASTRODUODENOSCOPY (EGD);  Surgeon: Lafayette Dragon, MD;  Location[facility] ENDOSCOPY;  Service: Endoscopy;  Laterality: N/A;  . Colonoscopy N/A 11/18/2013    Procedure: COLONOSCOPY;  Surgeon: Lafayette Dragon, MD;  Location: [facility] ENDOSCOPY;  Service: Endoscopy;  Laterality: N/A;  . Right heart catheterization N/A 07/29/2011    Procedure: RIGHT HEART CATH;  Surgeon: Minus Breeding, MD;  Location: [facility] CATH LAB;  Service: Cardiovascular;  Laterality: N/A;  . Left heart catheterization with coronary angiogram N/A 07/26/2011    Procedure: LEFT HEART CATHETERIZATION WITH CORONARY ANGIOGRAM;  Surgeon: Larey Dresser, MD;  Location: [facility] CATH LAB;  Service: Cardiovascular;  Laterality: N/A;    Kallie Locks, MS, RD, LDN Pager # [phone] After hours/ weekend  pager # [phone]

## 2014-11-21 NOTE — Progress Notes (Signed)
PT Cancellation Note  Patient Details Name: Bridget Martinez MRN: [id number] DOB: 03-Apr-1951   Cancelled Treatment:    Reason Eval/Treat Not Completed: Medical issues which prohibited therapy. Pt planned for OR after 5pm this date. Surgery postponed from yesterday due to high glucose level of 400. PT to eval s/p surgery as able.   Kingsley Callander 11/21/2014, 7:12 AM   Kittie Plater, PT, DPT Pager #: [phone] Office #: [phone]

## 2014-11-21 NOTE — Progress Notes (Signed)
Insulin gtt reduced to 2.8

## 2014-11-21 NOTE — Progress Notes (Signed)
Patient ID: Bridget Martinez, female   DOB: 1951-01-28, 64 y.o.   MRN: [id number] H/H and blood glucose improved.  Will proceed to surgery this evening for a left total hip replacement to treat her left hip fracture.

## 2014-11-21 NOTE — Anesthesia Procedure Notes (Signed)
Spinal Patient location during procedure: OR Start time: 11/21/2014 2:40 PM End time: 11/21/2014 2:50 PM Staffing Performed by: anesthesiologist  Preanesthetic Checklist Completed: patient identified, site marked, surgical consent, pre-op evaluation, timeout performed, IV checked, risks and benefits discussed and monitors and equipment checked Spinal Block Patient position: left lateral decubitus Prep: Betadine Patient monitoring: heart rate, cardiac monitor, continuous pulse ox and blood pressure Approach: right paramedian Location: L4-5 Injection technique: single-shot Needle Needle type: Tuohy  Needle gauge: 22 G Needle length: 9 cm Assessment Sensory level: T10 Additional Notes 10 mg 0.5% bupivacaine injected easily

## 2014-11-21 NOTE — Progress Notes (Signed)
OT Cancellation Note  Patient Details Name: Bridget Martinez MRN: [id number] DOB: [birth year]   Cancelled Treatment:    Reason Eval/Treat Not Completed: Medical issues which prohibited therapy. Pt planned for OR after 5pm this date. Surgery postponed from yesterday due to high glucose level of 400. OT to eval s/p surgery as able.   Rekisha Welling , MS, OTR/L, CLT Pager: [phone]  11/21/2014, 8:19 AM

## 2014-11-21 NOTE — Brief Op Note (Signed)
11/19/2014 - 11/21/2014  4:24 PM  PATIENT:  Bridget Martinez  64 y.o. female  PRE-OPERATIVE DIAGNOSIS:  left hip fracture  POST-OPERATIVE DIAGNOSIS:  left hip fracture  PROCEDURE:  Procedure(s): TOTAL HIP ARTHROPLASTY ANTERIOR APPROACH (Left)  SURGEON:  Surgeon(s) and Role:    * Mcarthur Rossetti, MD - Primary  PHYSICIAN ASSISTANT: Benita Stabile, PA-C  ANESTHESIA:   spinal  EBL:  Total I/O In: 585 [I.V.:250; Blood:335] Out: 550 [Urine:400; Blood:150]  BLOOD ADMINISTERED:none  DRAINS: none   LOCAL MEDICATIONS USED:  NONE  SPECIMEN:  No Specimen  DISPOSITION OF SPECIMEN:  N/A  COUNTS:  YES  TOURNIQUET:  * No tourniquets in log *  DICTATION: .Other Dictation: Dictation Number [id number]  PLAN OF CARE: Admit to inpatient   PATIENT DISPOSITION:  PACU - hemodynamically stable.   Delay start of Pharmacological VTE agent (>24hrs) due to surgical blood loss or risk of bleeding: no

## 2014-11-21 NOTE — Progress Notes (Signed)
Utilization review completed. Valta Dillon, RN, BSN. 

## 2014-11-21 NOTE — Progress Notes (Signed)
Inpatient Diabetes Program Recommendations  AACE/ADA: New Consensus Statement on Inpatient Glycemic Control (2013)  Target Ranges:  Prepandial:   less than 140 mg/dL      Peak postprandial:   less than 180 mg/dL (1-2 hours)      Critically ill patients:  140 - 180 mg/dL   Reason for Assessment:  Results for GENEVRA, SABET (MRN [id number]) as of 11/21/2014 12:47  Ref. Range 11/20/2014 16:24 11/20/2014 16:32 11/20/2014 16:50 11/20/2014 17:10 11/20/2014 21:23 11/21/2014 01:00 11/21/2014 04:19 11/21/2014 07:54 11/21/2014 11:41  Glucose-Capillary Latest Range: 70-99 mg/dL 33 (LL) 168 (H) 135 (H) 102 (H) 64 (L) 135 (H) 222 (H) 332 (H) 178 (H)    Diabetes history: Type 1 diabetes-Note CBG's labile during hospitalization Outpatient Diabetes medications: Levemir 10 units daily, Novolog 8 units tid with meals Current orders for Inpatient glycemic control:  Levemir 7 units q HS, Novolog 3 units tid with meals, Novolog sensitive tid with meals and HS  Note that patient is NPO today. Novolog meal coverage will need to be held while NPO.  Also consider increasing Novolog correction to q 4 hours while NPO.  Will follow.  Thanks, Adah Perl, RN, BC-ADM Inpatient Diabetes Coordinator Pager [phone]

## 2014-11-21 NOTE — Progress Notes (Addendum)
PROGRESS NOTE  Bridget Martinez [id number] DOB: 1951-06-09 DOA: 11/19/2014 PCP: [name], FNP  History of present illness Bridget Martinez is a 63 y.o. 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 Patient reports that she had fall when she was working in kitchen at home at about 7:30 PM. She did not pass out, no chest pain or palpitation before the event. It was most likely a mechanical fall per her husband. She had a small amount of bleeding from a small scalp laceration of her head. She developed moderate pain over left hip. She could not stand on left leg without pain. No neck pain. Patient denied fever, chills, headaches, cough, chest pain, SOB, abdominal pain, diarrhea, constipation, dysuria, urgency, frequency, hematuria, skin rashes or leg swelling. No unilateral weakness, numbness or tingling sensations. No vision change or hearing loss. In ED, patient was found to have mildly displaced transcervical fracture through the left femoral neck by X-ray. CT-head is negative for acute abnormalities. Hypokalemia, negative troponin, stable hemoglobin, stable renal function. Patient was admitted to inpatient for further evaluation and treatment. Orthopedic surgery was consulted by ED.  Subjective: - seems a bit anxious this morning,  Assessment/Plan:  Left femoral neck fracture status post mechanical fall: Per patient's family, she had been home only for a week from the skilled nursing facility  - Orthopedic surgery consulted, patient was seen by Dr. Ninfa Linden with surgery planned for 3/14 afternoon - Patient is high risk for surgery given EF of 30-35%, multiple medical problems however due to alleviation of pain and quality of life,  patient wants to proceed with the surgery  - Hemoglobin 8.1 3/13, transfused 1 unit packed RBC prior to the surgery, IV Lasix 20 mg x1 after the blood transfusion - Blood sugars also uncontrolled in 400s, given subcutaneous insulin and Levemir, but no significant effect, will place on IV glucostabilizer perioperatively until patient is tolerating diet after the surgery - CBGs improved 3/14 and better in preparation for surgery  Diabetes mellitus-type I: Patient has a history of DKA. Poorly controlled diabetes. Recent A1c was 10.0 on 10/26/14. Per patient's family at the bedside, she has significant difficulty controlling the blood sugars due to gastroparesis. - DC glucose stabilizer, placed on sliding scale insulin, meal coverage and Levemir  Diabetic gastroparesis: - patient's QTc=506, hold Zofran, repeat EKG, if still QTc prolonged, will have to discontinue Reglan, place on Phenergan for nausea, repeat EKG in am  CKD-III: Baseline creatinine 1.4-1.6.  - Currently renal function stable, placed on gentle hydration,NPO status   Combined systolic and diastolic Congestive heart failure:  - 2-D echo on 10/29/14 showed EF 30-35% with grade 2 diastolic dysfunction, currently compensated however on IV fluids, avoid fluid overload - Continue aspirin, Coreg and spironolactone - Lasix after the transfusion - careful with fluids, may need IV diuresis after surgery  - weight 127 > 136 although appears to be net negative 1L  Coronary artery disease: Post status of CABG in 2013. Patient does not have chest pain. - Continue aspirin, Coreg, Crestor - Holding Plavix for the surgery  GERD: -Placed on IV Protonix, NPO   Hyperlipidemia: LDL was at 172 on 09/27/13 -Continue Crestor  Severe protein calorie malnutrition - nutrition supplementation.  Hypokalemia:  -Replaced   Hypomagnesemia 1.5 -will give 2 g IV  magnesium  Anemia: previous anemia panel consistent with anemia of chronic disease. H&H  remained stable. - Hemoglobin 8.1, discussed with orthopedic surgery, recommended to transfuse 1 unit packed RBC - improved adequately, monitor post op  Hypothyroidism: TSH was 2.7 on 07/26/14 -Continued on home regimen of Synthroid.   Code Status: Full code DVT Prophylaxis: Subcutaneous heparin Family Communication: discussed with family bedside   Disposition: Will need skilled nursing facility  Consultants:  Ortho- Dr Ninfa Linden  Procedures:  none  Antibiotics:  none     Objective: Weight change: 3.961 kg (8 lb 11.7 oz)  Intake/Output Summary (Last 24 hours) at 11/21/14 1414 Last data filed at 11/21/14 1230  Gross per 24 hour  Intake    470 ml  Output   1825 ml  Net  -1355 ml   Blood pressure 108/45, pulse 69, temperature 97.6 F (36.4 C), temperature source Oral, resp. rate 16, height 5' 3.5" (1.613 m), weight 61.961 kg (136 lb 9.6 oz), SpO2 100 %.  Physical Exam:  General: Alert and awake, oriented x3, not in any acute distress. HEENT: anicteric sclera, PERLA, EOMI, small scalp laceration CVS: S1-S2 clear, no murmur rubs or gallops, trace LE edema Chest: no wheezing Abdomen: soft nontender, nondistended, normal bowel sounds  Extremities: no cyanosis, clubbing, bony tenderness over left hip Neuro: non focal  Lab Results: Basic Metabolic Panel:  Recent Labs Lab 11/20/14 0535 11/20/14 1134 11/21/14 0225  NA 133* 137 136  K 3.9 2.8* 4.5  CL 100 106 103  CO2 15* 17* 25  GLUCOSE 427* 275* 186*  BUN 18 19 16   CREATININE 1.48* 1.63* 1.56*  CALCIUM 8.8 9.1 9.0  MG 1.5  --   --    Liver Function Tests:  Recent Labs Lab 11/20/14 0535 11/20/14 1134  AST 18 22  ALT 18 19  ALKPHOS 100 100  BILITOT 1.6* 1.2  PROT 6.4 6.5  ALBUMIN 3.7 3.7   CBC:  Recent Labs Lab 11/20/14 0150 11/20/14 0535 11/21/14 0225  WBC 12.7* 11.7* 9.1  NEUTROABS 10.9*  --   --   HGB 8.3* 8.1* 9.2*  HCT 24.4* 24.6* 27.3*  MCV 90.7 91.4 88.6  PLT 277 275 235    CBG:  Recent Labs Lab 11/20/14 2123 11/21/14 0100 11/21/14 0419 11/21/14 0754 11/21/14 1141  GLUCAP 64* 135* 222* 332* 178*     Micro Results: Recent Results (from the past 240 hour(s))  Surgical pcr screen     Status: Abnormal   Collection Time: 11/20/14 11:28 AM  Result Value Ref Range Status   MRSA, PCR NEGATIVE NEGATIVE Final   Staphylococcus aureus POSITIVE (A) NEGATIVE Final    Comment:        The Xpert SA Assay (FDA approved for NASAL specimens in patients over 87 years of age), is one component of a comprehensive surveillance program.  Test performance has been validated by [facility] for patients greater than or equal to 45 year old. It is not intended to diagnose infection nor to guide or monitor treatment. Performed at [facility]     Studies/Results: Ct Head Wo Contrast  11/19/2014   CLINICAL DATA:  Head injury after fall. Possible loss of consciousness.  EXAM: CT HEAD WITHOUT CONTRAST  TECHNIQUE: Contiguous axial images were obtained from the base of the skull through the vertex without intravenous contrast.  COMPARISON:  07/26/2014  FINDINGS: No intracranial hemorrhage, mass effect, or midline shift. No hydrocephalus. The basilar cisterns are patent. No evidence of territorial infarct.  No intracranial fluid collection. Mild generalized atrophy. Calvarium is intact. Unchanged chronic calcification adjacent to the inner table of left frontal skull. Included paranasal sinuses and mastoid air cells are well aerated.  IMPRESSION: No acute intracranial abnormality.   Electronically Signed   By: Jeb Levering M.D.   On: 11/19/2014 22:49   Portable Chest X-ray (1 View)  10/26/2014   CLINICAL DATA:  Diabetic ketoacidosis.  Hypertension  EXAM: PORTABLE CHEST - 1 VIEW  COMPARISON:  October 21, 2014.  FINDINGS: Lungs are clear. Heart is mildly enlarged with pulmonary vascularity within normal limits. Patient is status post coronary artery bypass grafting.  No adenopathy. No bone lesions.  IMPRESSION: Heart mildly enlarged.  No lung edema or consolidation.   Electronically Signed   By: Lowella Grip III M.D.   On: 10/26/2014 15:21   Dg Hips Bilat With Pelvis Min 5 Views  11/19/2014   CLINICAL DATA:  Status post fall, with bilateral hip pain, and bruising about the hips and groin. Initial encounter.  EXAM: BILATERAL HIP (WITH PELVIS) 5-6 VIEWS  COMPARISON:  None.  FINDINGS: There is a mildly displaced transcervical fracture through the left femoral neck, with superior displacement of the distal femur and mild rotation of the left femoral head. No additional fractures are seen. Both femoral heads remain seated within their respective acetabuli. The right hip joint is grossly unremarkable in appearance.  Mild sclerotic change is seen at the sacroiliac joints. Clips are noted overlying the right inguinal region. Scattered vascular calcifications are seen. The visualized bowel gas pattern is grossly unremarkable.  IMPRESSION: 1. Mildly displaced transcervical fracture through the left femoral neck. 2. Scattered vascular calcifications seen.   Electronically Signed   By: Garald Balding M.D.   On: 11/19/2014 22:54    Medications: Scheduled Meds: . sodium chloride   Intravenous Once  . [MAR Hold] aspirin EC  325 mg Oral Daily  . [MAR Hold] carvedilol  3.125 mg Oral BID WC  . [MAR Hold] escitalopram  10 mg Oral Daily  . [MAR Hold] feeding supplement (ENSURE COMPLETE)  237 mL Oral BID BM  . [MAR Hold] heparin  5,000 Units Subcutaneous 3 times per day  . [MAR Hold] insulin aspart  0-5 Units Subcutaneous QHS  . [MAR Hold] insulin aspart  0-9 Units Subcutaneous TID WC  . [MAR Hold] insulin aspart  3 Units Subcutaneous TID WC  . [MAR Hold] insulin detemir  7 Units Subcutaneous QHS  . [MAR Hold] levothyroxine  125 mcg Oral QAC breakfast  . [MAR Hold] metoCLOPramide  5 mg Oral TID AC & HS  . [MAR Hold] pantoprazole (PROTONIX) IV  40 mg Intravenous Q12H  . [MAR  Hold] protein supplement  2 oz Oral TID  . [MAR Hold] rosuvastatin  40 mg Oral Daily  . [MAR Hold] sodium chloride  10-40 mL Intracatheter Q12H  . [MAR Hold] sodium chloride  3 mL Intravenous Q12H  . [MAR Hold] spironolactone  25 mg Oral Daily     LOS: 2 days   Marzetta Board M.D. [facility] 11/21/2014, 2:14 PM Pager: [phone]  If 7PM-7AM, please contact night-coverage [email or web address] Password [email or web address]

## 2014-11-21 NOTE — Anesthesia Postprocedure Evaluation (Signed)
  Anesthesia Post-op Note  Patient: Bridget Martinez  Procedure(s) Performed: Procedure(s): TOTAL HIP ARTHROPLASTY ANTERIOR APPROACH (Left)  Patient Location: PACU  Anesthesia Type:MAC and Spinal  Level of Consciousness: awake, alert  and oriented  Airway and Oxygen Therapy: Patient Spontanous Breathing and Patient connected to nasal cannula oxygen  Post-op Pain: none  Post-op Assessment: Post-op Vital signs reviewed, Patient's Cardiovascular Status Stable, Respiratory Function Stable, Patent Airway and Pain level controlled  Post-op Vital Signs: stable  Last Vitals:  Filed Vitals:   11/21/14 1700  BP:   Pulse: 59  Temp:   Resp: 11    Complications: No apparent anesthesia complications

## 2014-11-21 NOTE — Transfer of Care (Signed)
Immediate Anesthesia Transfer of Care Note  Patient: Bridget Martinez  Procedure(s) Performed: Procedure(s): TOTAL HIP ARTHROPLASTY ANTERIOR APPROACH (Left)  Patient Location: PACU  Anesthesia Type:Spinal  Level of Consciousness: awake, alert  and oriented  Airway & Oxygen Therapy: Patient Spontanous Breathing and Patient connected to nasal cannula oxygen  Post-op Assessment: Report given to RN and Post -op Vital signs reviewed and stable  Post vital signs: Reviewed and stable  Last Vitals:  Filed Vitals:   11/21/14 1229  BP: 108/45  Pulse: 69  Temp: 36.4 C  Resp: 16    Complications: No apparent anesthesia complications

## 2014-11-22 ENCOUNTER — Encounter (HOSPITAL_COMMUNITY): Payer: Self-pay | Admitting: Orthopaedic Surgery

## 2014-11-22 ENCOUNTER — Inpatient Hospital Stay (HOSPITAL_COMMUNITY): Payer: BC Managed Care – PPO

## 2014-11-22 DIAGNOSIS — G9340 Encephalopathy, unspecified: Secondary | ICD-10-CM

## 2014-11-22 LAB — BASIC METABOLIC PANEL
Anion gap: 10 (ref 5–15)
BUN: 17 mg/dL (ref 6–23)
CO2: 22 mmol/L (ref 19–32)
CREATININE: 1.68 mg/dL — AB (ref 0.50–1.10)
Calcium: 8.6 mg/dL (ref 8.4–10.5)
Chloride: 101 mmol/L (ref 96–112)
GFR, EST AFRICAN AMERICAN: 36 mL/min — AB (ref >90)
GFR, EST NON AFRICAN AMERICAN: 31 mL/min — AB (ref >90)
Glucose, Bld: 354 mg/dL — ABNORMAL HIGH (ref 70–99)
Potassium: 4.5 mmol/L (ref 3.5–5.1)
Sodium: 133 mmol/L — ABNORMAL LOW (ref 135–145)

## 2014-11-22 LAB — GLUCOSE, CAPILLARY
GLUCOSE-CAPILLARY: 334 mg/dL — AB (ref 70–99)
GLUCOSE-CAPILLARY: 320 mg/dL — AB (ref 70–99)
GLUCOSE-CAPILLARY: 148 mg/dL — AB (ref 70–99)
GLUCOSE-CAPILLARY: 155 mg/dL — AB (ref 70–99)
GLUCOSE-CAPILLARY: 153 mg/dL — AB (ref 70–99)

## 2014-11-22 LAB — CBC
HEMATOCRIT: 27.9 % — AB (ref 36.0–46.0)
HEMOGLOBIN: 9.6 g/dL — AB (ref 12.0–15.0)
MCH: 30.8 pg (ref 26.0–34.0)
MCHC: 34.4 g/dL (ref 30.0–36.0)
MCV: 89.4 fL (ref 78.0–100.0)
Platelets: 194 10*3/uL (ref 150–400)
RBC: 3.12 MIL/uL — ABNORMAL LOW (ref 3.87–5.11)
RDW: 16.5 % — ABNORMAL HIGH (ref 11.5–15.5)
WBC: 8 10*3/uL (ref 4.0–10.5)

## 2014-11-22 MED ORDER — OXYCODONE HCL 5 MG PO TABS
5.0000 mg | ORAL_TABLET | ORAL | Status: DC | PRN
  Administered 2014-11-24 – 2014-11-28 (×5): 5 mg via ORAL
  Filled 2014-11-22 (×5): qty 1

## 2014-11-22 MED ORDER — PANTOPRAZOLE SODIUM 40 MG PO TBEC
40.0000 mg | DELAYED_RELEASE_TABLET | Freq: Two times a day (BID) | ORAL | Status: DC
  Administered 2014-11-22 – 2014-11-23 (×3): 40 mg via ORAL
  Filled 2014-11-22 (×3): qty 1

## 2014-11-22 MED ORDER — INSULIN DETEMIR 100 UNIT/ML ~~LOC~~ SOLN
9.0000 [IU] | Freq: Every day | SUBCUTANEOUS | Status: DC
  Administered 2014-11-22 – 2014-11-24 (×3): 9 [IU] via SUBCUTANEOUS
  Filled 2014-11-22 (×3): qty 0.09

## 2014-11-22 NOTE — Evaluation (Signed)
Physical Therapy Evaluation Patient Details Name: Bridget Martinez MRN: [id number] DOB: 1951-03-21 Today's Date: 11/22/2014   History of Present Illness  Pt is a 64 yo female who sustained a mechanical fall and underwent a L anterior direct THA. pt with h/o of uncontrolled diabetes, depression and heart history.  Clinical Impression  Pt lethargic greatly limiting active participation in PT eval this date. Pt with no initiation of mvmt and c/o nausea from attempting to eat lunch. Pt with minimal weight-bearing tolerance on L LE. Pt currently a 2 person assist and is unsafe to return home at this time. Recommend SNF to achieve maximal functional recovery.    Follow Up Recommendations SNF;Supervision/Assistance - 24 hour    Equipment Recommendations  None recommended by PT    Recommendations for Other Services       Precautions / Restrictions Precautions Precautions: Fall Restrictions Weight Bearing Restrictions: Yes LLE Weight Bearing: Weight bearing as tolerated      Mobility  Bed Mobility Overal bed mobility: Needs Assistance Bed Mobility: Supine to Sit     Supine to sit: Max assist;HOB elevated     General bed mobility comments: pt did not initiate any part of transfer, maxA for turnk elevation and LE management  Transfers Overall transfer level: Needs assistance Equipment used: Rolling walker (2 wheeled) Transfers: Sit to/from [facility] Sit to Stand: Max assist;+2 physical assistance Stand pivot transfers: Max assist;+2 physical assistance       General transfer comment: max directional v/c's, pt stood with 2 person lift and then completed std pvt with RW  Ambulation/Gait Ambulation/Gait assistance: Max assist;+2 safety/equipment Ambulation Distance (Feet): 3 Feet Assistive device: Rolling walker (2 wheeled) Gait Pattern/deviations: Step-to pattern;Decreased stance time - left;Decreased weight shift to left;Festinating;Shuffle;Narrow base of  support Gait velocity: Decreased   General Gait Details: strong lean to the Right, maxA to maintain upright position and walker management. maxa to achieve weight shift for advancement of LEs  Stairs            Wheelchair Mobility    Modified Rankin (Stroke Patients Only)       Balance Overall balance assessment: Needs assistance;History of Falls Sitting-balance support: Feet supported;Bilateral upper extremity supported Sitting balance-Leahy Scale: Poor   Postural control: Posterior lean;Right lateral lean Standing balance support: Bilateral upper extremity supported Standing balance-Leahy Scale: Poor Standing balance comment: posterior and R lateral lean                             Pertinent Vitals/Pain Pain Assessment: Faces Faces Pain Scale: Hurts whole lot Pain Location: L hip when L LE moved Pain Intervention(s): Monitored during session    Home Living Family/patient expects to be discharged to:: Skilled nursing facility                 Additional Comments: pt was a [facility] SN[facility] until this past weekend when she return home for 24 hours and sustained a fall.    Prior Function Level of Independence: Needs assistance   Gait / Transfers Assistance Needed: per son pt was amb with RW with therapy at SNF  ADL's / Homemaking Assistance Needed: assist for LB dressing and bathing        Hand Dominance        Extremity/Trunk Assessment   Upper Extremity Assessment: Generalized weakness           Lower Extremity Assessment: Generalized weakness;LLE deficits/detail   LLE Deficits /  Details: limtied passive ROM tolerance due to onset of pain  Cervical / Trunk Assessment: Normal  Communication      Cognition Arousal/Alertness: Lethargic;Suspect due to medications Behavior During Therapy: Flat affect Overall Cognitive Status: Impaired/Different from baseline Area of Impairment: Orientation;Following  commands;Attention;Memory;Awareness;Problem solving Orientation Level: Disoriented to;Time;Situation Current Attention Level: Sustained Memory: Decreased recall of precautions;Decreased short-term memory Following Commands: Follows one step commands with increased time   Awareness: Intellectual Problem Solving: Slow processing;Decreased initiation;Difficulty sequencing;Requires verbal cues;Requires tactile cues      General Comments      Exercises Total Joint Exercises Long Arc Quad: AROM;Both;10 reps;Seated      Assessment/Plan    PT Assessment Patient needs continued PT services  PT Diagnosis Difficulty walking;Generalized weakness   PT Problem List Decreased strength;Decreased activity tolerance;Decreased balance;Decreased mobility  PT Treatment Interventions DME instruction;Gait training;Stair training;Functional mobility training;Therapeutic activities;Therapeutic exercise;Neuromuscular re-education;Patient/family education   PT Goals (Current goals can be found in the Care Plan section) Acute Rehab PT Goals Patient Stated Goal: not stated PT Goal Formulation: With patient/family Time For Goal Achievement: 12/06/14 Potential to Achieve Goals: Fair    Frequency 7X/week   Barriers to discharge        Co-evaluation               End of Session Equipment Utilized During Treatment: Gait belt Activity Tolerance: Patient tolerated treatment well;Patient limited by lethargy Patient left: in chair;with call bell/phone within reach;with family/visitor present Nurse Communication: Mobility status         Time: 1400-1430 PT Time Calculation (min) (ACUTE ONLY): 30 min   Charges:   PT Evaluation $Initial PT Evaluation Tier I: 1 Procedure PT Treatments $Therapeutic Activity: 8-22 mins   PT G CodesKingsley Callander 11/22/2014, 4:21 PM   Kittie Plater, PT, DPT Pager #: [phone] Office #: [phone]

## 2014-11-22 NOTE — Evaluation (Signed)
Occupational Therapy Evaluation Patient Details Name: Bridget Martinez MRN: [id number] DOB: Feb 12, 1951 Today's Date: 11/22/2014    History of Present Illness [name], never smoker, with PMH of HTN, HLD, CAD s/p CABG, CHF, GERD, TB, osteoporosis / arthritis, depression and DM-1 (since age 74) who presented to [facility] on 2/17 via EMS after being seen by the home health RN with hyperglycemia. Recent hospitalization at [facility] 2/11-2/16/16 for DKA.   Clinical Impression   Pt admitted with the above diagnoses and presents with below problem list. Pt will benefit from continued acute OT to address the below listed deficits and maximize independence with BADLs prior to d/c to next venue. PTA pt reports she was independent with ADLs. Bedside eval only this session with pt presenting with decreased cognition and decreased movements, suspect medications. Nursing notified and MD arrived during session and assessed. Pt likely +2 max physical assist for LB ADLs. OT to continue to follow acutely. Pt stated she does not have assistance while spouse is at work.    Follow Up Recommendations  SNF    Equipment Recommendations  Other (comment) (TBD)    Recommendations for Other Services       Precautions / Restrictions Precautions Precautions: Fall Precaution Comments: hypoglycemia Restrictions Weight Bearing Restrictions: Yes LLE Weight Bearing: Weight bearing as tolerated      Mobility Bed Mobility               General bed mobility comments: unable to perform components of bed mobility; likely +2 max physical assist  Transfers                      Balance                                            ADL Overall ADL's : Needs assistance/impaired Eating/Feeding: Set up;Sitting   Grooming: Set up;Sitting   Upper Body Bathing: Minimal assitance;Sitting   Lower Body Bathing: Sit to/from stand;Maximal assistance;+2 for physical assistance    Upper Body Dressing : Minimal assistance;Sitting   Lower Body Dressing: Maximal assistance;+2 for physical assistance   Toilet Transfer: Maximal assistance;Stand-pivot;BSC;RW;+2 for physical assistance   Toileting- Clothing Manipulation and Hygiene: Maximal assistance;Sit to/from stand;+2 for physical assistance   Tub/ Shower Transfer: Maximal assistance;Stand-pivot;3 in 1;Rolling walker;+2 for physical assistance     General ADL Comments: Pt with very limited overall movement this session likely due to medications. Pt c/o pain in Rt knee with knee flexion stating that she fell on that side. Pt also c/c her rt leg feeling "heavy." Clinical judgement used to determine level that pt is likely at with ADLs once meds have cleared her system.     Vision     Perception     Praxis      Pertinent Vitals/Pain Pain Assessment: No/denies pain     Hand Dominance     Extremity/Trunk Assessment Upper Extremity Assessment Upper Extremity Assessment: Generalized weakness   Lower Extremity Assessment Lower Extremity Assessment: Defer to PT evaluation       Communication Communication Communication: Other (comment) (very slow to speak)   Cognition Arousal/Alertness: Suspect due to medications;Lethargic (pt with eyes open but slow to respond and decreased movement) Behavior During Therapy: Flat affect Overall Cognitive Status: Impaired/Different from baseline Area of Impairment: Orientation;Following commands;Attention;Memory;Awareness Orientation Level: Place Current Attention Level: Sustained Memory:  Decreased recall of precautions;Decreased short-term memory Following Commands: Follows one step commands inconsistently       General Comments: Pt presenting with impaired cognition. Nursing notified and MD came in during evaluation. Suspect meds. Pt stated she was at home but later correctly stated that she was at the hospital. Slow initiation of speech with therapist repeating  questions.   General Comments       Exercises       Shoulder Instructions      Home Living Family/patient expects to be discharged to:: Private residence Living Arrangements: Spouse/significant other Available Help at Discharge: Family;Available PRN/intermittently;Other (Comment) (pt reports spouse works and no one else is available) Type of Home: House Home Access: Stairs to enter [employer] of Steps: 3 Entrance Stairs-Rails: None Home Layout: One level     Bathroom Shower/Tub: Occupational psychologist: [place]: Environmental consultant - 2 wheels;Cane - single point;Wheelchair - [employer];Shower seat          Prior Functioning/Environment Level of Independence: Independent        Comments: pt reports she was independent; per previous admission note pt drives    OT Diagnosis: Generalized weakness;Acute pain   OT Problem List: Decreased strength;Decreased range of motion;Decreased activity tolerance;Impaired balance (sitting and/or standing);Decreased knowledge of use of DME or AE;Decreased knowledge of precautions;Pain   OT Treatment/Interventions: Self-care/ADL training;DME and/or AE instruction;Therapeutic activities;Balance training;Patient/family education    OT Goals(Current goals can be found in the care plan section) Acute Rehab OT Goals Patient Stated Goal: not stated OT Goal Formulation: With patient Time For Goal Achievement: 11/29/14 Potential to Achieve Goals: Good ADL Goals Pt Will Perform Lower Body Bathing: with min guard assist;sit to/from stand;with adaptive equipment Pt Will Perform Lower Body Dressing: with min guard assist;with adaptive equipment;sit to/from stand Pt Will Transfer to Toilet: with min guard assist;ambulating (3n1 over toilet) Pt Will Perform Toileting - Clothing Manipulation and hygiene: with min guard assist;with adaptive equipment;sit to/from stand Pt Will Perform Tub/Shower Transfer: with  min guard assist;ambulating;3 in 1;rolling walker Additional ADL Goal #1: Pt will perform bed mobility at min guard level to prepare for OOB ADLs.  OT Frequency: Min 2X/week   Barriers to D/C:            Co-evaluation              End of Session Equipment Utilized During Treatment: Oxygen Nurse Communication: Other (comment) (cognitive status; "heavy" unaffected LE)  Activity Tolerance: Patient limited by lethargy Patient left: in bed;with call bell/phone within reach   Time: 0902-0930 OT Time Calculation (min): 28 min Charges:  OT General Charges $OT Visit: 1 Procedure OT Evaluation $Initial OT Evaluation Tier I: 1 Procedure G-Codes:    Hortencia Pilar 12/22/14, 9:54 AM

## 2014-11-22 NOTE — Op Note (Signed)
NAME:  Bridget Martinez, Bridget Martinez NO.:  [id number]  MEDICAL RECORD NO.:  [id number]  LOCATION:                                 FACILITY:  PHYSICIAN:  Lind Guest. Ninfa Linden, M.D.DATE OF BIRTH:  1951-04-23  DATE OF PROCEDURE:  11/21/2014 DATE OF DISCHARGE:                              OPERATIVE REPORT   PREOPERATIVE DIAGNOSIS:  Displaced left hip femoral neck fracture.  POSTOPERATIVE DIAGNOSIS:  Displaced left hip femoral neck fracture.  PROCEDURE:  Left total hip arthroplasty through direct anterior approach.  IMPLANTS:  DePuy Sector Gription acetabular component size 48, size 32+ 0 neutral polyethylene liner, size 13 Corail femoral component with standard offset, size 32+ 1 ceramic hip ball.  SURGEON:  Jean Rosenthal MD.  ASSISTING:  Erskine Emery, P.A.C.  ANESTHESIA:  Spinal.  BLOOD LOSS:  200-250 mL.  FLUIDS:  1 unit of packed red blood cells.  COMPLICATIONS:  None.  INDICATIONS:  Ms. Hunnewell is a 64 year old with no previous hip disease, who yesterday or early morning sustained a mechanical fall accident in her kitchen, and she was transported to the [facility] Emergency Room and found to have a displaced femoral neck fracture.  Given her young age, it was felt that she would benefit more from a total hip arthroplasty through direct anterior approach than a hemiarthroplasty. She does have cervical morbidities including diabetes, and heart disease as well as chronic renal insufficiency.  We decided not do surgery yesterday because her blood glucose was too high, and we wanted to have her blood sugars more regulated and stabilized prior to proceeding with the surgery today.  She was transported to [facility] for this with understanding fully the risks and benefits of surgery.  She understands the risk of acute blood loss anemia, nerve and vessel injury, fracture, infection, dislocation, and DVT.  She understands the goals are decreased pain, improved  mobility, and hopefully overall improved quality of life as well as lessening any morbidity associated with a non- fixed broken hip.  DESCRIPTION OF PROCEDURE:  After informed consent was obtained, appropriate left hip was marked.  She was brought to the operating room. While she was on a stretcher,  spinal anesthesia was obtained.  A Foley catheter was already placed and both feet had traction boots applied to the them.  She was placed supine on the Hana fracture table with the perineal post in place and both legs in inline skeletal traction devices, but no traction applied.  We were able to assess radiographically the left hip and right hip as well as pelvis under direct fluoroscopy, and we then prepped the left hip with DuraPrep and sterile drapes including a sterile drape over the fluoroscopy unit. Time-out was called, and she was identified as the correct patient, correct left hip.  I then made an incision inferior and posterior to the anterior superior iliac spine and carried this obliquely down the leg. I dissected down to the tensor fascia lata muscle and the tensor fascia was divided longitudinally, so I could proceed with a direct anterior approach to the hip.  We identified and cauterized the lateral femoral circumflex vessels and then we were able to put Cobra retractors  around the lateral femoral neck that was the remnants of the neck and then medially as well.  We then opened the hip capsule and found a large hematoma from her previous femoral neck fracture and placed the retractors within the hip capsule.  I was then able to make a femoral neck cut with an oscillating saw just proximal to the lesser trochanter and completed this with an osteotome.  I placed a corkscrew to guide the femoral head and removed the femoral head in its entirety.  I then placed a bent Hohmann along the medial acetabular rim and removed remnants of acetabular labrum as well as the fovea.  I then  began reaming under direct visualization from a size 42 reamer only up to a size 48 with all reamers under direct visualization and the last reamer also under fluoroscopy, so I could obtain our depth of reaming, our inclination and anteversion.  Once I was pleased with that position, I placed the real DePuy Sector Gription acetabular component size 48, an apex hole eliminator and a 32+ 0 neutral polyethylene liner for that sized acetabular component.  Attention was then turned to the femur with the leg externally rotated to 100 degrees extended and adducted, I was able to place a Mueller retractor medially and Hohmann retractor behind the greater trochanter.  I used a box cutting osteotome to enter the femoral canal.  After lateralizing with a rongeur, I released the lateral joint capsule as well.  I then broached from a size 8 broach using the Corail broaching system up to a size 13.  With a size 13 in place, we trialed a 32+ 1 hip ball.  We brought the leg back over and up and with traction and internal rotation, reduced the pelvis and it was stable.  I was pleased with offset and leg lengths as well.  We then dislocated the hip and removed the trial components.  I placed the real Corail femoral component size 13 and the real 32+ 1 ceramic hip ball and reduced this back in the acetabulum.  Again I was pleased with stability.  We then copiously irrigated the soft tissue and normal saline solution using pulsatile lavage.  We closed the joint capsule with #1 Ethibond suture followed by running #1 Vicryl in the tensor fascia, 2-0 Vicryl in the deep tissue, 2-0 Vicryl in subcutaneous tissue, and staples on the skin.  Xeroform and an Aquacel dressing were applied.  She was then taken off the Hana table and taken to the recovery in stable condition.  All final counts were correct and no complications noted.  Of note, Erskine Emery, P.A.C.  Assisted in the entire case and his assistance was  crucial for facilitating all aspects of this case.  Postoperatively, she will continue antibiotics and will get her up with therapy with weightbearing as tolerated with assistance and no hip precautions.     Lind Guest. Ninfa Linden, M.D.     CYB/MEDQ  D:  11/21/2014  T:  11/22/2014  Job:  [id number]

## 2014-11-22 NOTE — Progress Notes (Addendum)
PROGRESS NOTE  Bridget Martinez [id number] DOB: 1951/05/24 DOA: 11/19/2014 PCP: [name], FNP  History of present illness Bridget Martinez is a 64 y.o. 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 Patient reports that she had fall when she was working in kitchen at home at about 7:30 PM. She did not pass out, no chest pain or palpitation before the event. It was most likely a mechanical fall per her husband. She had a small amount of bleeding from a small scalp laceration of her head. She developed moderate pain over left hip. She could not stand on left leg without pain. No neck pain. Patient denied fever, chills, headaches, cough, chest pain, SOB, abdominal pain, diarrhea, constipation, dysuria, urgency, frequency, hematuria, skin rashes or leg swelling. No unilateral weakness, numbness or tingling sensations. No vision change or hearing loss. In ED, patient was found to have mildly displaced transcervical fracture through the left femoral neck by X-ray. CT-head is negative for acute abnormalities. Hypokalemia, negative troponin, stable hemoglobin, stable renal function. Patient was admitted to inpatient for further evaluation and treatment. Orthopedic surgery was consulted by ED.  Subjective: - intermittent confusion this morning  Assessment/Plan:  Left femoral neck fracture status post mechanical fall - Per patient's family, she had been home only for a week from the skilled nursing facility  - Orthopedic surgery consulted and patient underwent total hip arthroplasty 3/14 - Hemoglobin 8.1 3/13, transfused 1 unit packed RBC prior to the surgery, IV Lasix 20 mg x1 after the blood transfusion, stable post op - CBGs difficult to  control as she is very brittle, required glucostabilizer 3/13 prior to surgery  Acute encephalopathy - intermittent confusion this morning, after pain medications - not oriented to place/time for PT, AxOx3 on my evaluation, later on did not recognized her husband - obtained MRI brain also given RLE weakness (non surgical leg), negative - minimize narcotics and muscle relaxants   Diabetes mellitus-type I - Patient has a history of DKA. Poorly controlled diabetes. Recent A1c was 10.0 on 10/26/14.  - significant difficulty controlling the blood sugars due to gastroparesis. - continue Levemir and SSI, adjust as needed  Diabetic gastroparesis: - continue Reglan - monitor QTc  CKD-III:  - Baseline creatinine 1.4-1.6.  - Currently renal function stable  Combined systolic and diastolic Congestive heart failure:  - 2-D echo on 10/29/14 showed EF 30-35% with grade 2 diastolic dysfunction, currently compensated however on IV fluids, avoid fluid overload - Continue aspirin, coreg and spironolactone - Lasix after the transfusion - careful with fluids, may need IV diuresis after surgery  - weight 127 > 136 >> 144 although appears to be net negative 1.4 L, she is borderline hypotensive so hold Lasix for now. Respiratory status stable.   Coronary artery disease:  - Post status of CABG in 2013. Patient does not have chest pain. - Continue aspirin, Coreg, Crestor - restart Plavix per ortho post op  GERD: - Placed on IV Protonix, NPO   Hyperlipidemia:  - LDL was at 172 on 09/27/13 - Continue Crestor  Severe protein calorie malnutrition - nutrition supplementation.  Hypokalemia / Hypomagnesemia - replete as needed  Anemia:  - previous anemia panel consistent with anemia of chronic disease. H&H remained stable. - monitor post op  Hypothyroidism:  - TSH was  2.7 on 07/26/14 - Continued on home regimen of Synthroid.    Code Status: Full code DVT Prophylaxis: Subcutaneous  heparin Family Communication: discussed with family bedside   Disposition: Will need skilled nursing facility  Consultants:  Ortho- Dr Ninfa Linden  Procedures:  none  Antibiotics:  none   Objective: Weight change: 3.765 kg (8 lb 4.8 oz)  Intake/Output Summary (Last 24 hours) at 11/22/14 1429 Last data filed at 11/22/14 0617  Gross per 24 hour  Intake    885 ml  Output    900 ml  Net    -15 ml   Blood pressure 97/68, pulse 75, temperature 98.4 F (36.9 C), temperature source Oral, resp. rate 16, height 5' 3.5" (1.613 m), weight 65.726 kg (144 lb 14.4 oz), SpO2 100 %.  Physical Exam: General: Alert and awake, oriented x3, not in any acute distress. HEENT: anicteric sclera, PERLA, EOMI, small scalp laceration CVS: S1-S2 clear, no murmur rubs or gallops, trace LE edema Chest: no wheezing Abdomen: soft nontender, nondistended, normal bowel sounds  Extremities: no cyanosis, clubbing, bony tenderness over left hip Neuro: 5/5 upper extremities, 5-/5 bilateral LE, poor effort  Lab Results: Basic Metabolic Panel:  Recent Labs Lab 11/20/14 0535  11/21/14 0225 11/22/14 0452  NA 133*  < > 136 133*  K 3.9  < > 4.5 4.5  CL 100  < > 103 101  CO2 15*  < > 25 22  GLUCOSE 427*  < > 186* 354*  BUN 18  < > 16 17  CREATININE 1.48*  < > 1.56* 1.68*  CALCIUM 8.8  < > 9.0 8.6  MG 1.5  --   --   --   < > = values in this interval not displayed.  Liver Function Tests:  Recent Labs Lab 11/20/14 0535 11/20/14 1134  AST 18 22  ALT 18 19  ALKPHOS 100 100  BILITOT 1.6* 1.2  PROT 6.4 6.5  ALBUMIN 3.7 3.7   CBC:  Recent Labs Lab 11/20/14 0150  11/21/14 0225 11/22/14 0452  WBC 12.7*  < > 9.1 8.0  NEUTROABS 10.9*  --   --   --   HGB 8.3*  < > 9.2* 9.6*  HCT 24.4*  < > 27.3* 27.9*  MCV 90.7  < > 88.6 89.4  PLT 277  < > 235 194  < > = values in this interval not displayed. CBG:  Recent Labs Lab 11/21/14 2025 11/21/14 2348 11/22/14 0348 11/22/14 0800 11/22/14 1141   GLUCAP 163* 342* 334* 320* 148*   Micro Results: Recent Results (from the past 240 hour(s))  Surgical pcr screen     Status: Abnormal   Collection Time: 11/20/14 11:28 AM  Result Value Ref Range Status   MRSA, PCR NEGATIVE NEGATIVE Final   Staphylococcus aureus POSITIVE (A) NEGATIVE Final    Comment:        The Xpert SA Assay (FDA approved for NASAL specimens in patients over 83 years of age), is one component of a comprehensive surveillance program.  Test performance has been validated by [facility] for patients greater than or equal to 42 year old. It is not intended to diagnose infection nor to guide or monitor treatment. Performed at [facility]    Studies/Results: Ct Head Wo Contrast  11/19/2014   CLINICAL DATA:  Head injury after fall. Possible loss of consciousness.  EXAM: CT HEAD WITHOUT CONTRAST  TECHNIQUE: Contiguous axial images were obtained from the base of the skull through the  vertex without intravenous contrast.  COMPARISON:  07/26/2014  FINDINGS: No intracranial hemorrhage, mass effect, or midline shift. No hydrocephalus. The basilar cisterns are patent. No evidence of territorial infarct. No intracranial fluid collection. Mild generalized atrophy. Calvarium is intact. Unchanged chronic calcification adjacent to the inner table of left frontal skull. Included paranasal sinuses and mastoid air cells are well aerated.  IMPRESSION: No acute intracranial abnormality.   Electronically Signed   By: Jeb Levering M.D.   On: 11/19/2014 22:49   Mr Brain Wo Contrast  11/22/2014   CLINICAL DATA:  Confusion.  EXAM: MRI HEAD WITHOUT CONTRAST  TECHNIQUE: Multiplanar, multiecho pulse sequences of the brain and surrounding structures were obtained without intravenous contrast.  COMPARISON:  Head CT 11/19/2014  FINDINGS: There is no evidence of acute infarct, intracranial hemorrhage, intra-axial mass, midline shift, or extra-axial fluid collection. There is mild generalized  cerebral atrophy. Periventricular white matter T2 hyperintensities are nonspecific but compatible with mild chronic small vessel ischemic disease. 1.5 cm focus of chronic ossification along the inner table of the left frontal skull is unchanged; underlying meningioma not excluded.  Orbits are unremarkable. Paranasal sinuses and mastoid air cells are clear. Major intracranial vascular flow voids are preserved.  IMPRESSION: 1. No acute intracranial abnormality. 2. Mild chronic small vessel ischemic disease and cerebral atrophy.   Electronically Signed   By: Logan Bores   On: 11/22/2014 13:00   Portable Chest X-ray (1 View)  10/26/2014   CLINICAL DATA:  Diabetic ketoacidosis.  Hypertension  EXAM: PORTABLE CHEST - 1 VIEW  COMPARISON:  October 21, 2014.  FINDINGS: Lungs are clear. Heart is mildly enlarged with pulmonary vascularity within normal limits. Patient is status post coronary artery bypass grafting. No adenopathy. No bone lesions.  IMPRESSION: Heart mildly enlarged.  No lung edema or consolidation.   Electronically Signed   By: Lowella Grip III M.D.   On: 10/26/2014 15:21   Dg Hip Port Unilat With Pelvis 1v Left  11/21/2014   CLINICAL DATA:  Postoperative exam after left total hip replacement  EXAM: LEFT HIP (WITH PELVIS) 1 VIEW PORTABLE  COMPARISON:  None.  FINDINGS: Expected postoperative appearance after left total hip arthroplasty. No evidence for hardware failure. No fracture line visualized. Surgical staples are present. Clips project over the right upper thigh soft tissues.  IMPRESSION: Expected postoperative appearance after left total hip arthroplasty.   Electronically Signed   By: Conchita Paris M.D.   On: 11/21/2014 18:21   Dg Hip Operative Unilat With Pelvis Left  11/21/2014   CLINICAL DATA:  Total left hip arthroplasty.  EXAM: OPERATIVE LEFT HIP (WITH PELVIS IF PERFORMED) 2 VIEWS  TECHNIQUE: Fluoroscopic spot image(s) were submitted for interpretation post-operatively.  COMPARISON:   None.  FINDINGS: Two intraoperative spot images demonstrate changes of left hip replacement. Normal AP alignment. No hardware or bony complicating feature visualized.  IMPRESSION: Left hip replacement without visible complicating feature on these intraoperative spot images.   Electronically Signed   By: Rolm Baptise M.D.   On: 11/21/2014 16:19   Dg Hips Bilat With Pelvis Min 5 Views  11/19/2014   CLINICAL DATA:  Status post fall, with bilateral hip pain, and bruising about the hips and groin. Initial encounter.  EXAM: BILATERAL HIP (WITH PELVIS) 5-6 VIEWS  COMPARISON:  None.  FINDINGS: There is a mildly displaced transcervical fracture through the left femoral neck, with superior displacement of the distal femur and mild rotation of the left femoral head. No additional fractures are seen.  Both femoral heads remain seated within their respective acetabuli. The right hip joint is grossly unremarkable in appearance.  Mild sclerotic change is seen at the sacroiliac joints. Clips are noted overlying the right inguinal region. Scattered vascular calcifications are seen. The visualized bowel gas pattern is grossly unremarkable.  IMPRESSION: 1. Mildly displaced transcervical fracture through the left femoral neck. 2. Scattered vascular calcifications seen.   Electronically Signed   By: Garald Balding M.D.   On: 11/19/2014 22:54    Medications: Scheduled Meds: . sodium chloride   Intravenous Once  . sodium chloride   Intravenous Once  . aspirin EC  325 mg Oral Daily  . aspirin EC  325 mg Oral BID PC  . carvedilol  3.125 mg Oral BID WC  . docusate sodium  100 mg Oral BID  . escitalopram  10 mg Oral Daily  . heparin  5,000 Units Subcutaneous 3 times per day  . insulin aspart  0-5 Units Subcutaneous QHS  . insulin aspart  0-9 Units Subcutaneous TID WC  . insulin aspart  3 Units Subcutaneous TID WC  . insulin detemir  9 Units Subcutaneous QHS  . levothyroxine  125 mcg Oral QAC breakfast  . metoCLOPramide  5  mg Oral TID AC & HS  . pantoprazole  40 mg Oral BID  . protein supplement  1 packet Oral BID BM  . rosuvastatin  40 mg Oral Daily  . sodium chloride  10-40 mL Intracatheter Q12H  . sodium chloride  3 mL Intravenous Q12H  . spironolactone  25 mg Oral Daily     LOS: 3 days   Marzetta Board M.D. [facility] 11/22/2014, 2:29 PM Pager: [phone]  If 7PM-7AM, please contact night-coverage [email or web address] Password [email or web address]

## 2014-11-22 NOTE — Progress Notes (Signed)
Subjective: 1 Day Post-Op Procedure(s) (LRB): TOTAL HIP ARTHROPLASTY ANTERIOR APPROACH (Left) Patient reports pain as mild.  Denies chest pain , SOB, or calf pain.  Objective: Vital signs in last 24 hours: Temp:  [97.2 F (36.2 C)-98.4 F (36.9 C)] 98.4 F (36.9 C) (03/15 0549) Pulse Rate:  [57-75] 75 (03/15 0549) Resp:  [7-16] 16 (03/15 0400) BP: (95-113)/(39-68) 97/68 mmHg (03/15 0549) SpO2:  [92 %-100 %] 100 % (03/15 0549) Weight:  [65.726 kg (144 lb 14.4 oz)] 65.726 kg (144 lb 14.4 oz) (03/15 0458)  Intake/Output from previous day: 03/14 0701 - 03/15 0700 In: 885 [I.V.:450; Blood:335; IV Piggyback:100] Out: 1300 [Urine:1150; Blood:150] Intake/Output this shift:     Recent Labs  11/19/14 2306 11/20/14 0150 11/20/14 0535 11/21/14 0225 11/22/14 0452  HGB 9.9* 8.3* 8.1* 9.2* 9.6*    Recent Labs  11/21/14 0225 11/22/14 0452  WBC 9.1 8.0  RBC 3.08* 3.12*  HCT 27.3* 27.9*  PLT 235 194    Recent Labs  11/21/14 0225 11/22/14 0452  NA 136 133*  K 4.5 4.5  CL 103 101  CO2 25 22  BUN 16 17  CREATININE 1.56* 1.68*  GLUCOSE 186* 354*  CALCIUM 9.0 8.6    Recent Labs  11/20/14 0150  INR 1.16    Sensation intact distally Intact pulses distally Dorsiflexion/Plantar flexion intact Incision: dressing C/D/I Compartment soft  Assessment/Plan: 1 Day Post-Op Procedure(s) (LRB): TOTAL HIP ARTHROPLASTY ANTERIOR APPROACH (Left) Up with therapy  Monitor for symptoms of anemia  CLARK, GILBERT 11/22/2014, 8:42 AM

## 2014-11-23 ENCOUNTER — Ambulatory Visit: Payer: BC Managed Care – PPO | Admitting: Nurse Practitioner

## 2014-11-23 LAB — GLUCOSE, CAPILLARY
GLUCOSE-CAPILLARY: 162 mg/dL — AB (ref 70–99)
Glucose-Capillary: 123 mg/dL — ABNORMAL HIGH (ref 70–99)
Glucose-Capillary: 130 mg/dL — ABNORMAL HIGH (ref 70–99)
Glucose-Capillary: 157 mg/dL — ABNORMAL HIGH (ref 70–99)
Glucose-Capillary: 168 mg/dL — ABNORMAL HIGH (ref 70–99)
Glucose-Capillary: 310 mg/dL — ABNORMAL HIGH (ref 70–99)

## 2014-11-23 LAB — CBC
HCT: 25.1 % — ABNORMAL LOW (ref 36.0–46.0)
Hemoglobin: 8.4 g/dL — ABNORMAL LOW (ref 12.0–15.0)
MCH: 29.1 pg (ref 26.0–34.0)
MCHC: 33.5 g/dL (ref 30.0–36.0)
MCV: 86.9 fL (ref 78.0–100.0)
Platelets: 188 10*3/uL (ref 150–400)
RBC: 2.89 MIL/uL — ABNORMAL LOW (ref 3.87–5.11)
RDW: 15.9 % — ABNORMAL HIGH (ref 11.5–15.5)
WBC: 7.7 10*3/uL (ref 4.0–10.5)

## 2014-11-23 LAB — BASIC METABOLIC PANEL
ANION GAP: 7 (ref 5–15)
BUN: 20 mg/dL (ref 6–23)
CALCIUM: 8.7 mg/dL (ref 8.4–10.5)
CO2: 23 mmol/L (ref 19–32)
Chloride: 104 mmol/L (ref 96–112)
Creatinine, Ser: 1.47 mg/dL — ABNORMAL HIGH (ref 0.50–1.10)
GFR calc Af Amer: 42 mL/min — ABNORMAL LOW (ref >90)
GFR, EST NON AFRICAN AMERICAN: 37 mL/min — AB (ref >90)
Glucose, Bld: 136 mg/dL — ABNORMAL HIGH (ref 70–99)
Potassium: 3.7 mmol/L (ref 3.5–5.1)
SODIUM: 134 mmol/L — AB (ref 135–145)

## 2014-11-23 MED ORDER — FUROSEMIDE 10 MG/ML IJ SOLN: 20.0000 mg | Freq: Once | INTRAMUSCULAR | Status: DC

## 2014-11-23 MED ORDER — PANTOPRAZOLE SODIUM 40 MG PO PACK
40.0000 mg | PACK | Freq: Two times a day (BID) | ORAL | Status: DC
  Administered 2014-11-24 – 2014-11-28 (×9): 40 mg via ORAL
  Filled 2014-11-23 (×13): qty 20

## 2014-11-23 MED ORDER — ASPIRIN 325 MG PO TBEC: 325.0000 mg | DELAYED_RELEASE_TABLET | Freq: Two times a day (BID) | ORAL | Status: DC

## 2014-11-23 MED ORDER — HYDROCODONE-ACETAMINOPHEN 5-325 MG PO TABS: 1.0000 | ORAL_TABLET | ORAL | Status: DC | PRN

## 2014-11-23 MED ORDER — ASPIRIN 325 MG PO TABS
325.0000 mg | ORAL_TABLET | Freq: Two times a day (BID) | ORAL | Status: DC
  Administered 2014-11-24 – 2014-11-25 (×3): 325 mg via ORAL
  Filled 2014-11-23 (×3): qty 1

## 2014-11-23 MED ORDER — DOCUSATE SODIUM 50 MG/5ML PO LIQD
100.0000 mg | Freq: Two times a day (BID) | ORAL | Status: DC
  Filled 2014-11-23 (×3): qty 10

## 2014-11-23 NOTE — Clinical Social Work Psychosocial (Signed)
Clinical Social Work Department BRIEF PSYCHOSOCIAL ASSESSMENT 11/23/2014  Patient:  Bridget Martinez,Bridget Martinez     Account Number:  [id number]     Admit date:  09/03/2013  Clinical Social Worker:  Delrae Sawyers  Date/Time:  11/23/2014 11:46 AM  Referred by:  Physician  Date Referred:  11/23/2014 Referred for  SNF Placement   Other Referral:   none.   Interview type:  Patient Other interview type:   Patient's husband, Bridget Martinez, present at bedside.    PSYCHOSOCIAL DATA Living Status:  HUSBAND Admitted from facility:   Level of care:   Primary support name:  Bridget Martinez Primary support relationship to patient:  SPOUSE Degree of support available:   Strong support system.    CURRENT CONCERNS Current Concerns  Post-Acute Placement   Other Concerns:   none.    SOCIAL WORK ASSESSMENT / PLAN CSW received referral for possible SNF placement at time of discharge. CSW met with patient and patient's husband at bedside to discuss discharge dispositions. Patient presented pleasant and aware of PT recommendation for SNF placement. Patient informed CSW patient would prefer to continue physical therapy at [facility] once medically stable for discharge. Patient stated patient lives in [place], but is familiar with [facility].    CSW to continue to follow and assist with patient's discharge planning.   Assessment/plan status:  Psychosocial Support/Ongoing Assessment of Needs Other assessment/ plan:   none.   Information/referral to community resources:   [facility] bed offers.    PATIENT'S/FAMILY'S RESPONSE TO PLAN OF CARE: Patient understanding and agreeable to CSW plan of care. Patient expressed no further questions or concerns at this time.       [name], [place] ([phone]) Licensed Clinical Social Worker Orthopedics [phone]) and Surgical [phone])

## 2014-11-23 NOTE — Progress Notes (Signed)
PROGRESS NOTE  Bridget Martinez [id number] DOB: 21-Nov-1950 DOA: 11/19/2014 PCP: [name], FNP  History of present illness Bridget Martinez is a 64 y.o. 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 Patient reports that she had fall when she was working in kitchen at home at about 7:30 PM. She did not pass out, no chest pain or palpitation before the event. It was most likely a mechanical fall per her husband. She had a small amount of bleeding from a small scalp laceration of her head. She developed moderate pain over left hip. She could not stand on left leg without pain. No neck pain. Patient denied fever, chills, headaches, cough, chest pain, SOB, abdominal pain, diarrhea, constipation, dysuria, urgency, frequency, hematuria, skin rashes or leg swelling. No unilateral weakness, numbness or tingling sensations. No vision change or hearing loss. In ED, patient was found to have mildly displaced transcervical fracture through the left femoral neck by X-ray. CT-head is negative for acute abnormalities. Hypokalemia, negative troponin, stable hemoglobin, stable renal function. Patient was admitted to inpatient for further evaluation and treatment. Orthopedic surgery was consulted by ED.  Subjective: - feeling better this morning, mental status cleared when compared to yesterday  Assessment/Plan:  Left femoral neck fracture status post mechanical fall - Per patient's family, she had been home only for few days from the skilled nursing facility  - Orthopedic surgery consulted and patient underwent total hip arthroplasty 3/14 - Hemoglobin 8.1 3/13, transfused 1 unit packed RBC prior to the surgery, IV Lasix 20 mg x1 after the blood  transfusion, stable post op  Acute encephalopathy - intermittent confusion 3/15 morning, after pain medications - obtained MRI brain also given possible focal RLE weakness (non surgical leg), negative - minimize narcotics and muscle relaxants   Diabetes mellitus-type I - Patient has a history of DKA. Poorly controlled diabetes. Recent A1c was 10.0 on 10/26/14.  - CBGs difficult to control as she is very brittle, required glucostabilizer 3/13 prior to surgery, now improved - significant difficulty controlling the blood sugars due to gastroparesis. - continue Levemir and SSI, adjust as needed  Diabetic gastroparesis: - continue Reglan - monitor QTc  CKD-III:  - Baseline creatinine 1.4-1.6.  - Currently renal function stable  Combined systolic and diastolic Congestive heart failure:  - 2-D echo on 10/29/14 showed EF 30-35% with grade 2 diastolic dysfunction, currently compensated however on IV fluids, avoid fluid overload - Continue aspirin, coreg and spironolactone - Lasix after the transfusion - careful with fluids, may need IV diuresis after surgery  - weight 127 > 136 >> 144>>145  - borderline hypotensive on 3/15, blood pressure improved on 3/16, will give IV Lasix x 1.  Coronary artery disease:  - Post status of CABG in 2013. Patient does not have chest pain. - Continue aspirin, Coreg, Crestor - restart Plavix per ortho post op  Hyperlipidemia:  - LDL was at 172 on 09/27/13 - Continue Crestor  Severe protein calorie malnutrition - nutrition supplementation.  Hypokalemia / Hypomagnesemia - replete as needed  Anemia:  - previous anemia panel consistent with anemia of chronic disease. H&H remained stable. - monitor post op  Hypothyroidism:  - TSH was 2.7 on 07/26/14 - Continued on home regimen of Synthroid.    Code Status: Full code DVT Prophylaxis: Subcutaneous  heparin Family Communication: discussed with son bedside  Disposition: Will need skilled nursing  facility  Consultants:  Ortho- Dr Ninfa Linden  Procedures:  none  Antibiotics:  none  Objective: Weight change: 0.227 kg (8 oz)  Intake/Output Summary (Last 24 hours) at 11/23/14 0749 Last data filed at 11/23/14 0430  Gross per 24 hour  Intake    240 ml  Output    650 ml  Net   -410 ml   Blood pressure 100/38, pulse 73, temperature 97.7 F (36.5 C), temperature source Oral, resp. rate 18, height 5' 3.5" (1.613 m), weight 65.953 kg (145 lb 6.4 oz), SpO2 96 %.  Physical Exam: General: Alert and awake, oriented x3, not in any acute distress. HEENT: anicteric sclera, PERLA, EOMI, small scalp laceration CVS: S1-S2 clear, no murmur rubs or gallops, 1+ LE edema Chest: no wheezing Abdomen: soft nontender, nondistended, normal bowel sounds  Extremities: no cyanosis, clubbing, bony tenderness over left hip Neuro: 5/5 upper extremities, 5-/5 bilateral LE, poor effort  Lab Results: Basic Metabolic Panel:  Recent Labs Lab 11/20/14 0535  11/22/14 0452 11/23/14 0550  NA 133*  < > 133* 134*  K 3.9  < > 4.5 3.7  CL 100  < > 101 104  CO2 15*  < > 22 23  GLUCOSE 427*  < > 354* 136*  BUN 18  < > 17 20  CREATININE 1.48*  < > 1.68* 1.47*  CALCIUM 8.8  < > 8.6 8.7  MG 1.5  --   --   --   < > = values in this interval not displayed.  Liver Function Tests:  Recent Labs Lab 11/20/14 0535 11/20/14 1134  AST 18 22  ALT 18 19  ALKPHOS 100 100  BILITOT 1.6* 1.2  PROT 6.4 6.5  ALBUMIN 3.7 3.7   CBC:  Recent Labs Lab 11/20/14 0150  11/22/14 0452 11/23/14 0550  WBC 12.7*  < > 8.0 7.7  NEUTROABS 10.9*  --   --   --   HGB 8.3*  < > 9.6* 8.4*  HCT 24.4*  < > 27.9* 25.1*  MCV 90.7  < > 89.4 86.9  PLT 277  < > 194 188  < > = values in this interval not displayed. CBG:  Recent Labs Lab 11/22/14 0800 11/22/14 1141 11/22/14 1608 11/22/14 2211 11/23/14 0556  GLUCAP 320* 148* 155* 153* 130*   Micro Results: Recent Results (from the past 240 hour(s))  Surgical pcr  screen     Status: Abnormal   Collection Time: 11/20/14 11:28 AM  Result Value Ref Range Status   MRSA, PCR NEGATIVE NEGATIVE Final   Staphylococcus aureus POSITIVE (A) NEGATIVE Final    Comment:        The Xpert SA Assay (FDA approved for NASAL specimens in patients over 87 years of age), is one component of a comprehensive surveillance program.  Test performance has been validated by [facility] for patients greater than or equal to 62 year old. It is not intended to diagnose infection nor to guide or monitor treatment. Performed at [facility]    Studies/Results: Ct Head Wo Contrast  11/19/2014   CLINICAL DATA:  Head injury after fall. Possible loss of consciousness.  EXAM: CT HEAD WITHOUT CONTRAST  TECHNIQUE: Contiguous axial images were obtained from the base of the skull through the vertex without intravenous contrast.  COMPARISON:  07/26/2014  FINDINGS: No intracranial hemorrhage, mass effect, or midline shift. No hydrocephalus. The basilar cisterns are patent.  No evidence of territorial infarct. No intracranial fluid collection. Mild generalized atrophy. Calvarium is intact. Unchanged chronic calcification adjacent to the inner table of left frontal skull. Included paranasal sinuses and mastoid air cells are well aerated.  IMPRESSION: No acute intracranial abnormality.   Electronically Signed   By: Jeb Levering M.D.   On: 11/19/2014 22:49   Mr Brain Wo Contrast  11/22/2014   CLINICAL DATA:  Confusion.  EXAM: MRI HEAD WITHOUT CONTRAST  TECHNIQUE: Multiplanar, multiecho pulse sequences of the brain and surrounding structures were obtained without intravenous contrast.  COMPARISON:  Head CT 11/19/2014  FINDINGS: There is no evidence of acute infarct, intracranial hemorrhage, intra-axial mass, midline shift, or extra-axial fluid collection. There is mild generalized cerebral atrophy. Periventricular white matter T2 hyperintensities are nonspecific but compatible with mild  chronic small vessel ischemic disease. 1.5 cm focus of chronic ossification along the inner table of the left frontal skull is unchanged; underlying meningioma not excluded.  Orbits are unremarkable. Paranasal sinuses and mastoid air cells are clear. Major intracranial vascular flow voids are preserved.  IMPRESSION: 1. No acute intracranial abnormality. 2. Mild chronic small vessel ischemic disease and cerebral atrophy.   Electronically Signed   By: Logan Bores   On: 11/22/2014 13:00   Portable Chest X-ray (1 View)  10/26/2014   CLINICAL DATA:  Diabetic ketoacidosis.  Hypertension  EXAM: PORTABLE CHEST - 1 VIEW  COMPARISON:  October 21, 2014.  FINDINGS: Lungs are clear. Heart is mildly enlarged with pulmonary vascularity within normal limits. Patient is status post coronary artery bypass grafting. No adenopathy. No bone lesions.  IMPRESSION: Heart mildly enlarged.  No lung edema or consolidation.   Electronically Signed   By: Lowella Grip III M.D.   On: 10/26/2014 15:21   Dg Hip Port Unilat With Pelvis 1v Left  11/21/2014   CLINICAL DATA:  Postoperative exam after left total hip replacement  EXAM: LEFT HIP (WITH PELVIS) 1 VIEW PORTABLE  COMPARISON:  None.  FINDINGS: Expected postoperative appearance after left total hip arthroplasty. No evidence for hardware failure. No fracture line visualized. Surgical staples are present. Clips project over the right upper thigh soft tissues.  IMPRESSION: Expected postoperative appearance after left total hip arthroplasty.   Electronically Signed   By: Conchita Paris M.D.   On: 11/21/2014 18:21   Dg Hip Operative Unilat With Pelvis Left  11/21/2014   CLINICAL DATA:  Total left hip arthroplasty.  EXAM: OPERATIVE LEFT HIP (WITH PELVIS IF PERFORMED) 2 VIEWS  TECHNIQUE: Fluoroscopic spot image(s) were submitted for interpretation post-operatively.  COMPARISON:  None.  FINDINGS: Two intraoperative spot images demonstrate changes of left hip replacement. Normal AP  alignment. No hardware or bony complicating feature visualized.  IMPRESSION: Left hip replacement without visible complicating feature on these intraoperative spot images.   Electronically Signed   By: Rolm Baptise M.D.   On: 11/21/2014 16:19   Dg Hips Bilat With Pelvis Min 5 Views  11/19/2014   CLINICAL DATA:  Status post fall, with bilateral hip pain, and bruising about the hips and groin. Initial encounter.  EXAM: BILATERAL HIP (WITH PELVIS) 5-6 VIEWS  COMPARISON:  None.  FINDINGS: There is a mildly displaced transcervical fracture through the left femoral neck, with superior displacement of the distal femur and mild rotation of the left femoral head. No additional fractures are seen. Both femoral heads remain seated within their respective acetabuli. The right hip joint is grossly unremarkable in appearance.  Mild sclerotic change is seen at  the sacroiliac joints. Clips are noted overlying the right inguinal region. Scattered vascular calcifications are seen. The visualized bowel gas pattern is grossly unremarkable.  IMPRESSION: 1. Mildly displaced transcervical fracture through the left femoral neck. 2. Scattered vascular calcifications seen.   Electronically Signed   By: Garald Balding M.D.   On: 11/19/2014 22:54    Medications: Scheduled Meds: . sodium chloride   Intravenous Once  . sodium chloride   Intravenous Once  . aspirin EC  325 mg Oral Daily  . aspirin EC  325 mg Oral BID PC  . carvedilol  3.125 mg Oral BID WC  . docusate sodium  100 mg Oral BID  . escitalopram  10 mg Oral Daily  . heparin  5,000 Units Subcutaneous 3 times per day  . insulin aspart  0-5 Units Subcutaneous QHS  . insulin aspart  0-9 Units Subcutaneous TID WC  . insulin aspart  3 Units Subcutaneous TID WC  . insulin detemir  9 Units Subcutaneous QHS  . levothyroxine  125 mcg Oral QAC breakfast  . metoCLOPramide  5 mg Oral TID AC & HS  . pantoprazole  40 mg Oral BID  . protein supplement  1 packet Oral BID BM  .  rosuvastatin  40 mg Oral Daily  . sodium chloride  10-40 mL Intracatheter Q12H  . sodium chloride  3 mL Intravenous Q12H  . spironolactone  25 mg Oral Daily     LOS: 4 days    Marzetta Board M.D. [facility] 11/23/2014, 7:49 AM Pager: [phone]  If 7PM-7AM, please contact night-coverage [email or web address] Password [email or web address]

## 2014-11-23 NOTE — Clinical Social Work Placement (Addendum)
Clinical Social Work Department CLINICAL SOCIAL WORK PLACEMENT NOTE 11/23/2014  Patient:  Bridget Martinez,Bridget Martinez  Account Number:  [id number] Admit date:  09/03/2013  Clinical Social Worker:  Delrae Sawyers  Date/time:  11/23/2014 11:51 AM  Clinical Social Work is seeking post-discharge placement for this patient at the following level of care:   [facility]   (*CSW will update this form in Epic as items are completed)   11/23/2014  Patient/family provided with [place] Department of Clinical Social Work's list of facilities offering this level of care within the geographic area requested by the patient (or if unable, by the patient's family).  11/23/2014  Patient/family informed of their freedom to choose among providers that offer the needed level of care, that participate in Medicare, Medicaid or managed care program needed by the patient, have an available bed and are willing to accept the patient.  11/23/2014  Patient/family informed of [facility]' ownership interest in [facility], as well as of the fact that they are under no obligation to receive care at this facility.  PASARR submitted to EDS on 11/23/2014 PASARR number received on 11/23/2014  FL2 transmitted to all facilities in geographic area requested by pt/family on  11/23/2014 FL2 transmitted to all facilities within larger geographic area on   Patient informed that his/her managed care company has contracts with or will negotiate with  certain facilities, including the following:     Patient/family informed of bed offers received:  11/24/2014 Patient chooses bed at [facility] Physician recommends and patient chooses bed at    Patient to be transferred to  11/24/2014 on [facility]   Patient to be transferred to facility by  Patient and family notified of transfer on  Name of family member notified:    The following physician request were entered in Epic:   Additional Comments:  Henderson [place] ([id number]) Licensed Clinical Social Worker Orthopedics [phone]) and Surgical [phone])

## 2014-11-23 NOTE — Progress Notes (Signed)
Physical Therapy Treatment Patient Details Name: Bridget Martinez MRN: [id number] DOB: [birth year] Today's Date: 11/23/2014    History of Present Illness Pt is a 64 yo female who sustained a mechanical fall and underwent a L anterior direct THA. pt with h/o of uncontrolled diabetes, depression and heart history.    PT Comments    Pt oob mobility limited this date by vagal response requiring emergent return to bed due to pt becoming unresponsive. Pt's BP 90/40. RN aware. Acute PT to con't to follow to progress mobility as able.   Follow Up Recommendations  SNF;Supervision/Assistance - 24 hour     Equipment Recommendations       Recommendations for Other Services       Precautions / Restrictions Precautions Precautions: Fall Precaution Comments: pt with drop in BP and vagaled during amb attempt Restrictions Weight Bearing Restrictions: Yes LLE Weight Bearing: Weight bearing as tolerated    Mobility  Bed Mobility Overal bed mobility: Needs Assistance Bed Mobility: Supine to Sit     Supine to sit: Max assist;HOB elevated     General bed mobility comments: pt with minimal L LE mvmt. assist to birng hips to EOB  Transfers Overall transfer level: Needs assistance Equipment used: Rolling walker (2 wheeled) Transfers: Sit to/from Stand Sit to Stand: Max assist;+2 physical assistance Stand pivot transfers: Max assist;+2 physical assistance       General transfer comment: max directional v/c's, pt stood with 2 person lift and then completed std pvt with RW  Ambulation/Gait Ambulation/Gait assistance: Max assist;+2 physical assistance Ambulation Distance (Feet): 3 Feet Assistive device: Rolling walker (2 wheeled) Gait Pattern/deviations: Step-to pattern     General Gait Details: pt became unresponsive and was lowered to chair and then transfered back to bed. Pt then in trendelenburg and came too. PT's BP 111/43. once elevated to level BP 91/43 once elevated to 40 deg  90/40. Beth, RN came to assess situation.   Stairs            Wheelchair Mobility    Modified Rankin (Stroke Patients Only)       Balance                                    Cognition Arousal/Alertness: Awake/alert Behavior During Therapy: Flat affect           Following Commands: Follows one step commands with increased time     Problem Solving: Slow processing;Requires verbal cues;Requires tactile cues;Decreased initiation;Difficulty sequencing      Exercises      General Comments        Pertinent Vitals/Pain Pain Assessment: Faces Pain Score: 5  Pain Location: L hip when moving Pain Intervention(s): Monitored during session    Home Living                      Prior Function            PT Goals (current goals can now be found in the care plan section) Progress towards PT goals: Not progressing toward goals - comment (due to vagal response)    Frequency  7X/week    PT Plan Current plan remains appropriate    Co-evaluation             End of Session Equipment Utilized During Treatment: Gait belt Activity Tolerance: Patient tolerated treatment well;Patient limited by lethargy Patient left: in bed;with call bell/phone  within reach     Time: 1433-1447 PT Time Calculation (min) (ACUTE ONLY): 14 min  Charges:  $Gait Training: 8-22 mins                    G Codes:      Kingsley Callander 11/23/2014, 4:06 PM   Kittie Plater, PT, DPT Pager #: [phone] Office #: [phone]

## 2014-11-23 NOTE — Discharge Instructions (Signed)
Full weight bearing as tolerated left hip. No hip precautions. Can get current hip dressing wet daily in the shower.

## 2014-11-23 NOTE — Progress Notes (Signed)
Subjective: 2 Days Post-Op Procedure(s) (LRB): TOTAL HIP ARTHROPLASTY ANTERIOR APPROACH (Left) Patient reports pain as moderate.  Improved mental status today.  Very slow mobility.  Acute on very chronic blood loss anemia (at baseline).  Spoke with son at the bedside.  Objective: Vital signs in last 24 hours: Temp:  [97.7 F (36.5 C)-99.6 F (37.6 C)] 97.7 F (36.5 C) (03/16 0435) Pulse Rate:  [73-81] 73 (03/16 0435) Resp:  [16-18] 18 (03/16 0435) BP: (100-109)/(34-46) 100/38 mmHg (03/16 0435) SpO2:  [96 %-100 %] 96 % (03/16 0435) Weight:  [65.953 kg (145 lb 6.4 oz)] 65.953 kg (145 lb 6.4 oz) (03/16 0500)  Intake/Output from previous day: 03/15 0701 - 03/16 0700 In: 240 [P.O.:240] Out: 650 [Urine:650] Intake/Output this shift:     Recent Labs  11/21/14 0225 11/22/14 0452 11/23/14 0550  HGB 9.2* 9.6* 8.4*    Recent Labs  11/22/14 0452 11/23/14 0550  WBC 8.0 7.7  RBC 3.12* 2.89*  HCT 27.9* 25.1*  PLT 194 188    Recent Labs  11/22/14 0452 11/23/14 0550  NA 133* 134*  K 4.5 3.7  CL 101 104  CO2 22 23  BUN 17 20  CREATININE 1.68* 1.47*  GLUCOSE 354* 136*  CALCIUM 8.6 8.7   No results for input(s): LABPT, INR in the last 72 hours.  Intact pulses distally Dorsiflexion/Plantar flexion intact Incision: dressing C/D/I  Assessment/Plan: 2 Days Post-Op Procedure(s) (LRB): TOTAL HIP ARTHROPLASTY ANTERIOR APPROACH (Left) Up with therapy Discharge to SNF most likely.  BLACKMAN,CHRISTOPHER Y 11/23/2014, 7:48 AM

## 2014-11-24 ENCOUNTER — Inpatient Hospital Stay (HOSPITAL_COMMUNITY): Payer: BC Managed Care – PPO

## 2014-11-24 DIAGNOSIS — E038 Other specified hypothyroidism: Secondary | ICD-10-CM

## 2014-11-24 DIAGNOSIS — E1143 Type 2 diabetes mellitus with diabetic autonomic (poly)neuropathy: Secondary | ICD-10-CM

## 2014-11-24 DIAGNOSIS — E43 Unspecified severe protein-calorie malnutrition: Secondary | ICD-10-CM

## 2014-11-24 LAB — GLUCOSE, CAPILLARY
GLUCOSE-CAPILLARY: 112 mg/dL — AB (ref 70–99)
GLUCOSE-CAPILLARY: 260 mg/dL — AB (ref 70–99)
Glucose-Capillary: 240 mg/dL — ABNORMAL HIGH (ref 70–99)
Glucose-Capillary: 215 mg/dL — ABNORMAL HIGH (ref 70–99)

## 2014-11-24 LAB — URINALYSIS, ROUTINE W REFLEX MICROSCOPIC
Bilirubin Urine: NEGATIVE
Glucose, UA: 250 mg/dL — AB
Ketones, ur: NEGATIVE mg/dL
Nitrite: NEGATIVE
Protein, ur: 100 mg/dL — AB
Specific Gravity, Urine: 1.013 (ref 1.005–1.030)
Urobilinogen, UA: 1 mg/dL (ref 0.0–1.0)
pH: 6 (ref 5.0–8.0)

## 2014-11-24 LAB — BASIC METABOLIC PANEL
ANION GAP: 9 (ref 5–15)
BUN: 18 mg/dL (ref 6–23)
CHLORIDE: 100 mmol/L (ref 96–112)
CO2: 24 mmol/L (ref 19–32)
Calcium: 8.6 mg/dL (ref 8.4–10.5)
Creatinine, Ser: 1.36 mg/dL — ABNORMAL HIGH (ref 0.50–1.10)
GFR calc Af Amer: 47 mL/min — ABNORMAL LOW (ref >90)
GFR calc non Af Amer: 40 mL/min — ABNORMAL LOW (ref >90)
GLUCOSE: 119 mg/dL — AB (ref 70–99)
POTASSIUM: 3.3 mmol/L — AB (ref 3.5–5.1)
Sodium: 133 mmol/L — ABNORMAL LOW (ref 135–145)

## 2014-11-24 LAB — CBC
HEMATOCRIT: 24.5 % — AB (ref 36.0–46.0)
Hemoglobin: 8.1 g/dL — ABNORMAL LOW (ref 12.0–15.0)
MCH: 28.8 pg (ref 26.0–34.0)
MCHC: 33.1 g/dL (ref 30.0–36.0)
MCV: 87.2 fL (ref 78.0–100.0)
PLATELETS: 197 10*3/uL (ref 150–400)
RBC: 2.81 MIL/uL — ABNORMAL LOW (ref 3.87–5.11)
RDW: 15.4 % (ref 11.5–15.5)
WBC: 7.3 10*3/uL (ref 4.0–10.5)

## 2014-11-24 LAB — MAGNESIUM: Magnesium: 1.4 mg/dL — ABNORMAL LOW (ref 1.5–2.5)

## 2014-11-24 LAB — URINE MICROSCOPIC-ADD ON

## 2014-11-24 LAB — TSH: TSH: 0.887 u[IU]/mL (ref 0.350–4.500)

## 2014-11-24 MED ORDER — MEGESTROL ACETATE 40 MG PO TABS
400.0000 mg | ORAL_TABLET | Freq: Two times a day (BID) | ORAL | Status: DC
  Filled 2014-11-24 (×2): qty 10

## 2014-11-24 MED ORDER — MAGNESIUM SULFATE 2 GM/50ML IV SOLN
2.0000 g | Freq: Once | INTRAVENOUS | Status: AC
  Administered 2014-11-24: 2 g via INTRAVENOUS
  Filled 2014-11-24: qty 50

## 2014-11-24 MED ORDER — POLYETHYLENE GLYCOL 3350 17 G PO PACK
17.0000 g | PACK | Freq: Every day | ORAL | Status: DC
  Administered 2014-11-24 – 2014-11-28 (×3): 17 g via ORAL
  Filled 2014-11-24 (×4): qty 1

## 2014-11-24 MED ORDER — FLUCONAZOLE 200 MG PO TABS
200.0000 mg | ORAL_TABLET | Freq: Once | ORAL | Status: AC
  Administered 2014-11-25: 200 mg via ORAL
  Filled 2014-11-24 (×2): qty 1

## 2014-11-24 MED ORDER — SENNOSIDES-DOCUSATE SODIUM 8.6-50 MG PO TABS
2.0000 | ORAL_TABLET | Freq: Two times a day (BID) | ORAL | Status: DC
  Administered 2014-11-24 – 2014-11-28 (×8): 2 via ORAL
  Filled 2014-11-24 (×9): qty 2

## 2014-11-24 MED ORDER — MEGESTROL ACETATE 400 MG/10ML PO SUSP
400.0000 mg | Freq: Two times a day (BID) | ORAL | Status: DC
Start: 2014-11-24 — End: 2014-11-28
  Administered 2014-11-24 – 2014-11-28 (×8): 400 mg via ORAL
  Filled 2014-11-24 (×11): qty 10

## 2014-11-24 MED ORDER — POTASSIUM CHLORIDE CRYS ER 20 MEQ PO TBCR
40.0000 meq | EXTENDED_RELEASE_TABLET | Freq: Once | ORAL | Status: AC
  Administered 2014-11-24: 40 meq via ORAL
  Filled 2014-11-24: qty 2

## 2014-11-24 NOTE — Progress Notes (Addendum)
[name] [id number] DOB: 25-Jan-1951 DOA: 11/19/2014 PCP: [name], FNP  History of present illness Bridget Martinez is a 64 y.o. 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 Patient reports that she had fall when she was working in kitchen at home at about 7:30 PM. She did not pass out, no chest pain or palpitation before the event. It was most likely a mechanical fall per her husband. She had a small amount of bleeding from a small scalp laceration of her head. She developed moderate pain over left hip. She could not stand on left leg without pain. No neck pain. Patient denied fever, chills, headaches, cough, chest pain, SOB, abdominal pain, diarrhea, constipation, dysuria, urgency, frequency, hematuria, skin rashes or leg swelling. No unilateral weakness, numbness or tingling sensations. No vision change or hearing loss. In ED, patient was found to have mildly displaced transcervical fracture through the left femoral neck by X-ray. CT-head is negative for acute abnormalities. Hypokalemia, negative troponin, stable hemoglobin, stable renal function. Patient was admitted to inpatient for further evaluation and treatment. Orthopedic surgery was consulted by ED.  Subjective: - frail elderly female, looks older than states age, reported no bm x several days, denies n/v, does report decrease appetite and weight loss for the last several months. Husband at bedside.  Assessment/Plan:  Left femoral neck fracture status post mechanical fall - Per patient's family, she had been home only for few days from the skilled nursing facility  - Orthopedic surgery consulted and patient underwent total hip arthroplasty 3/14 -  Hemoglobin 8.1 3/13, transfused 1 unit packed RBC prior to the surgery, IV Lasix 20 mg x1 after the blood transfusion, stable post op -ortho to address when the patient is stable to restart plavix from surgical stand point. And type of DVT prophylaxis and duration  Acute encephalopathy - intermittent confusion 3/15 morning, after pain medications - obtained MRI brain also given possible focal RLE weakness (non surgical leg), negative - minimize narcotics and muscle relaxants   Diabetes mellitus-type I - Patient has a history of DKA. Poorly controlled diabetes. Recent A1c was 10.0 on 10/26/14.  - CBGs difficult to control as she is very brittle, required glucostabilizer 3/13 prior to surgery, now improved - significant difficulty controlling the blood sugars due to gastroparesis. - continue Levemir and SSI, adjust as needed  Diabetic gastroparesis: - continue Reglan - monitor QTc  CKD-III:  - Baseline creatinine 1.4-1.6.  - Currently renal function stable  Combined systolic and diastolic Congestive heart failure:  - 2-D echo on 10/29/14 showed EF 30-35% with grade 2 diastolic dysfunction, currently compensated however on IV fluids, avoid fluid overload - Continue aspirin, coreg and spironolactone - Lasix after the transfusion - careful with fluids, may need IV diuresis after surgery  - weight 127 > 136 >> 144>>145  - borderline hypotensive on 3/15, blood pressure improved on 3/16, will give IV Lasix x 1.  Coronary artery disease:  - Post status of CABG in 2013. Patient does not have chest pain. - Continue aspirin, Coreg, Crestor - restart Plavix per ortho post op  Hyperlipidemia:  - LDL was at 172 on 09/27/13 - Continue Crestor  Severe protein calorie malnutrition - nutrition supplementation.  Hypokalemia / Hypomagnesemia - replete as needed  Anemia:  -  previous anemia panel consistent with anemia of chronic disease. H&H remained stable. - monitor post  op  Hypothyroidism:  - TSH was 2.7 on 07/26/14 - Continued on home regimen of Synthroid.  FTT: very frail, per RN report patient not able to stand up. Per chart review patient has been in and out of hospital since 07/2014. However, patient and husband not able to provide more history.will order orthostatic vital sign, check ua/cxr/tsh/free cortisol level. Will start megace to boost appetite, if patient positive for orthostatic, will change diuretics to prn, currently looks dry on physical exam. If all test unrevealing, will d/c to SNF tomorrow.   Code Status: Full code DVT Prophylaxis: Subcutaneous heparin Family Communication: discussed with son bedside  Disposition: skilled nursing facility  Consultants:  Ortho- Dr Ninfa Linden  Procedures:  none  Antibiotics:  none  Objective: Weight change: -2.812 kg (-6 lb 3.2 oz)  Intake/Output Summary (Last 24 hours) at 11/24/14 1106 Last data filed at 11/24/14 0900  Gross per 24 hour  Intake    320 ml  Output   1050 ml  Net   -730 ml   Blood pressure 97/49, pulse 69, temperature 98.4 F (36.9 C), temperature source Oral, resp. rate 15, height 5' 3.5" (1.613 m), weight 63.141 kg (139 lb 3.2 oz), SpO2 97 %.  Physical Exam: General: Alert and awake, oriented x3, not in any acute distress. HEENT: anicteric sclera, PERLA, EOMI, small scalp laceration CVS: S1-S2 clear, no murmur rubs or gallops, 1+ LE edema Chest: no wheezing Abdomen: soft nontender, nondistended, normal bowel sounds  Extremities: no cyanosis, clubbing, bony tenderness over left hip Neuro: 5/5 upper extremities, 5-/5 bilateral LE, poor effort  Lab Results: Basic Metabolic Panel:  Recent Labs Lab 11/20/14 0535  11/23/14 0550 11/24/14 0535  NA 133*  < > 134* 133*  K 3.9  < > 3.7 3.3*  CL 100  < > 104 100  CO2 15*  < > 23 24  GLUCOSE 427*  < > 136* 119*  BUN 18  < > 20 18  CREATININE 1.48*  < > 1.47* 1.36*  CALCIUM 8.8  < > 8.7 8.6  MG 1.5  --   --   --    < > = values in this interval not displayed.  Liver Function Tests:  Recent Labs Lab 11/20/14 0535 11/20/14 1134  AST 18 22  ALT 18 19  ALKPHOS 100 100  BILITOT 1.6* 1.2  PROT 6.4 6.5  ALBUMIN 3.7 3.7   CBC:  Recent Labs Lab 11/20/14 0150  11/23/14 0550 11/24/14 0535  WBC 12.7*  < > 7.7 7.3  NEUTROABS 10.9*  --   --   --   HGB 8.3*  < > 8.4* 8.1*  HCT 24.4*  < > 25.1* 24.5*  MCV 90.7  < > 86.9 87.2  PLT 277  < > 188 197  < > = values in this interval not displayed. CBG:  Recent Labs Lab 11/23/14 1507 11/23/14 1629 11/23/14 1957 11/23/14 2352 11/24/14 0406  GLUCAP 162* 168* 157* 123* 112*   Micro Results: Recent Results (from the past 240 hour(s))  Surgical pcr screen     Status: Abnormal   Collection Time: 11/20/14 11:28 AM  Result Value Ref Range Status   MRSA, PCR NEGATIVE NEGATIVE Final   Staphylococcus aureus POSITIVE (A) NEGATIVE Final    Comment:        The Xpert SA Assay (FDA approved for NASAL specimens in patients over 21 years of  age), is one component of a comprehensive surveillance program.  Test performance has been validated by [facility] for patients greater than or equal to 18 year old. It is not intended to diagnose infection nor to guide or monitor treatment. Performed at [facility]    Studies/Results: Ct Head Wo Contrast  11/19/2014   CLINICAL DATA:  Head injury after fall. Possible loss of consciousness.  EXAM: CT HEAD WITHOUT CONTRAST  TECHNIQUE: Contiguous axial images were obtained from the base of the skull through the vertex without intravenous contrast.  COMPARISON:  07/26/2014  FINDINGS: No intracranial hemorrhage, mass effect, or midline shift. No hydrocephalus. The basilar cisterns are patent. No evidence of territorial infarct. No intracranial fluid collection. Mild generalized atrophy. Calvarium is intact. Unchanged chronic calcification adjacent to the inner table of left frontal skull. Included paranasal  sinuses and mastoid air cells are well aerated.  IMPRESSION: No acute intracranial abnormality.   Electronically Signed   By: Jeb Levering M.D.   On: 11/19/2014 22:49   Mr Brain Wo Contrast  11/22/2014   CLINICAL DATA:  Confusion.  EXAM: MRI HEAD WITHOUT CONTRAST  TECHNIQUE: Multiplanar, multiecho pulse sequences of the brain and surrounding structures were obtained without intravenous contrast.  COMPARISON:  Head CT 11/19/2014  FINDINGS: There is no evidence of acute infarct, intracranial hemorrhage, intra-axial mass, midline shift, or extra-axial fluid collection. There is mild generalized cerebral atrophy. Periventricular white matter T2 hyperintensities are nonspecific but compatible with mild chronic small vessel ischemic disease. 1.5 cm focus of chronic ossification along the inner table of the left frontal skull is unchanged; underlying meningioma not excluded.  Orbits are unremarkable. Paranasal sinuses and mastoid air cells are clear. Major intracranial vascular flow voids are preserved.  IMPRESSION: 1. No acute intracranial abnormality. 2. Mild chronic small vessel ischemic disease and cerebral atrophy.   Electronically Signed   By: Logan Bores   On: 11/22/2014 13:00   Portable Chest X-ray (1 View)  10/26/2014   CLINICAL DATA:  Diabetic ketoacidosis.  Hypertension  EXAM: PORTABLE CHEST - 1 VIEW  COMPARISON:  October 21, 2014.  FINDINGS: Lungs are clear. Heart is mildly enlarged with pulmonary vascularity within normal limits. Patient is status post coronary artery bypass grafting. No adenopathy. No bone lesions.  IMPRESSION: Heart mildly enlarged.  No lung edema or consolidation.   Electronically Signed   By: Lowella Grip III M.D.   On: 10/26/2014 15:21   Dg Hip Port Unilat With Pelvis 1v Left  11/21/2014   CLINICAL DATA:  Postoperative exam after left total hip replacement  EXAM: LEFT HIP (WITH PELVIS) 1 VIEW PORTABLE  COMPARISON:  None.  FINDINGS: Expected postoperative appearance  after left total hip arthroplasty. No evidence for hardware failure. No fracture line visualized. Surgical staples are present. Clips project over the right upper thigh soft tissues.  IMPRESSION: Expected postoperative appearance after left total hip arthroplasty.   Electronically Signed   By: Conchita Paris M.D.   On: 11/21/2014 18:21   Dg Hip Operative Unilat With Pelvis Left  11/21/2014   CLINICAL DATA:  Total left hip arthroplasty.  EXAM: OPERATIVE LEFT HIP (WITH PELVIS IF PERFORMED) 2 VIEWS  TECHNIQUE: Fluoroscopic spot image(s) were submitted for interpretation post-operatively.  COMPARISON:  None.  FINDINGS: Two intraoperative spot images demonstrate changes of left hip replacement. Normal AP alignment. No hardware or bony complicating feature visualized.  IMPRESSION: Left hip replacement without visible complicating feature on these intraoperative spot images.   Electronically Signed  By: Rolm Baptise M.D.   On: 11/21/2014 16:19   Dg Hips Bilat With Pelvis Min 5 Views  11/19/2014   CLINICAL DATA:  Status post fall, with bilateral hip pain, and bruising about the hips and groin. Initial encounter.  EXAM: BILATERAL HIP (WITH PELVIS) 5-6 VIEWS  COMPARISON:  None.  FINDINGS: There is a mildly displaced transcervical fracture through the left femoral neck, with superior displacement of the distal femur and mild rotation of the left femoral head. No additional fractures are seen. Both femoral heads remain seated within their respective acetabuli. The right hip joint is grossly unremarkable in appearance.  Mild sclerotic change is seen at the sacroiliac joints. Clips are noted overlying the right inguinal region. Scattered vascular calcifications are seen. The visualized bowel gas pattern is grossly unremarkable.  IMPRESSION: 1. Mildly displaced transcervical fracture through the left femoral neck. 2. Scattered vascular calcifications seen.   Electronically Signed   By: Garald Balding M.D.   On: 11/19/2014  22:54    Medications: Scheduled Meds: . sodium chloride   Intravenous Once  . sodium chloride   Intravenous Once  . aspirin EC  325 mg Oral Daily  . aspirin  325 mg Oral BID PC  . carvedilol  3.125 mg Oral BID WC  . escitalopram  10 mg Oral Daily  . heparin  5,000 Units Subcutaneous 3 times per day  . insulin aspart  0-5 Units Subcutaneous QHS  . insulin aspart  0-9 Units Subcutaneous TID WC  . insulin aspart  3 Units Subcutaneous TID WC  . insulin detemir  9 Units Subcutaneous QHS  . levothyroxine  125 mcg Oral QAC breakfast  . megestrol  40 mg Oral Daily  . metoCLOPramide  5 mg Oral TID AC & HS  . pantoprazole sodium  40 mg Oral BID  . polyethylene glycol  17 g Oral Daily  . potassium chloride  40 mEq Oral Once  . protein supplement  1 packet Oral BID BM  . rosuvastatin  40 mg Oral Daily  . senna-docusate  2 tablet Oral BID  . sodium chloride  10-40 mL Intracatheter Q12H  . sodium chloride  3 mL Intravenous Q12H  . spironolactone  25 mg Oral Daily     LOS: 5 days    Lashaun Krapf M.D. Ph.D [facility] 11/24/2014, 11:06 AM Pager: [phone]  If 7PM-7AM, please contact night-coverage [email or web address] Password [email or web address]    8pm, replace mag. (iv) Ua+yeast, oral fluconzaole 200mg  x1

## 2014-11-24 NOTE — Progress Notes (Signed)
Patient ID: Bridget Martinez, female   DOB: [birth year], 64 y.o.   MRN: [id number] Making better progress with therapy.  H/H low  - does have acute on chronic anemia and did come in with low H/H.  Minimally hypotensive.  With chronic renal insuff. May need at least one unit of blood, but will leave this up to primary team.

## 2014-11-24 NOTE — Progress Notes (Addendum)
Physical Therapy Treatment Patient Details Name: Bridget Martinez MRN: [id number] DOB: [birth year] Today's Date: 11/24/2014    History of Present Illness Pt is a 64 yo female who sustained a mechanical fall and underwent a L anterior direct THA. pt with h/o of uncontrolled diabetes, depression and heart history.    PT Comments    Pt much more alert and oriented this session. Pt requiring 2 person (A) for safety. BP in supine 105/36; sitting 96/50; after ambulation 104/55. Pt with min c/o dizziness. Cont to recommend SNF for post acute rehab.   Follow Up Recommendations  SNF;Supervision/Assistance - 24 hour     Equipment Recommendations  None recommended by PT    Recommendations for Other Services       Precautions / Restrictions Precautions Precautions: Fall Precaution Comments: Watch BP Restrictions Weight Bearing Restrictions: Yes LLE Weight Bearing: Weight bearing as tolerated    Mobility  Bed Mobility Overal bed mobility: Needs Assistance Bed Mobility: Rolling;Sidelying to Sit Rolling: Min assist Sidelying to sit: Mod assist;+2 for physical assistance       General bed mobility comments: pt with heavy use of bedrails; requiring (A) to bring LEs off EOB while 2nd person (A) with elevating trunk; cues for sequencing   Transfers Overall transfer level: Needs assistance Equipment used: Rolling walker (2 wheeled) Transfers: Sit to/from Stand Sit to Stand: Min assist;+2 physical assistance         General transfer comment: cues for sequencing and 2 person (A) for safety  Ambulation/Gait Ambulation/Gait assistance: +2 physical assistance;+2 safety/equipment;Mod assist Ambulation Distance (Feet): 8 Feet Assistive device: Rolling walker (2 wheeled) Gait Pattern/deviations: Step-to pattern;Decreased dorsiflexion - left;Decreased step length - left;Decreased step length - right;Narrow base of support Gait velocity: Decreased Gait velocity interpretation: Below normal  speed for age/gender General Gait Details: pt initially requiring physical facilitation to advance Lt step; 2 person (A) for safety; pt fatiques quickly    Stairs            Wheelchair Mobility    Modified Rankin (Stroke Patients Only)       Balance Overall balance assessment: Needs assistance;History of Falls Sitting-balance support: Feet supported;No upper extremity supported Sitting balance-Leahy Scale: Fair Sitting balance - Comments: sat EOB ~5 min for BP and worked on dynamic sitting balance  Postural control: Posterior lean Standing balance support: During functional activity;Bilateral upper extremity supported Standing balance-Leahy Scale: Poor Standing balance comment: RW to balance                     Cognition Arousal/Alertness: Awake/alert Behavior During Therapy: Flat affect Overall Cognitive Status: Within Functional Limits for tasks assessed                 General Comments: pt more oriented and alert today; following cues with incr time    Exercises Total Joint Exercises Ankle Circles/Pumps: AROM;15 reps Long Arc Quad: AROM;Both;10 reps;Seated General Exercises - Upper Extremity Shoulder Flexion: AROM;Left;10 reps;Seated Elbow Flexion: AROM;Right;Left;10 reps;Seated Elbow Extension: AROM;Right;Left;10 reps;Seated Digit Composite Flexion: AROM;Right;Left;Seated Composite Extension: AROM;Right;Left;Seated    General Comments        Pertinent Vitals/Pain Pain Assessment: 0-10 Pain Score: 6  Pain Location: Lt hip with movement  Pain Descriptors / Indicators: Discomfort Pain Intervention(s): Monitored during session;Premedicated before session;Repositioned    Home Living                      Prior Function  PT Goals (current goals can now be found in the care plan section) Acute Rehab PT Goals Patient Stated Goal: to not hurt PT Goal Formulation: With patient/family Time For Goal Achievement:  12/06/14 Potential to Achieve Goals: Fair Progress towards PT goals: Progressing toward goals    Frequency  Min 3X/week    PT Plan Current plan remains appropriate    Co-evaluation PT/OT/SLP Co-Evaluation/Treatment: Yes (partial time) Reason for Co-Treatment: Complexity of the patient's impairments (multi-system involvement);For patient/therapist safety PT goals addressed during session: Mobility/safety with mobility;Balance;Proper use of DME OT goals addressed during session: ADL's and self-care;Strengthening/ROM     End of Session Equipment Utilized During Treatment: Gait belt Activity Tolerance: Patient tolerated treatment well Patient left: in chair;with call bell/phone within reach;with chair alarm set     Time: [id number] PT Time Calculation (min) (ACUTE ONLY): 21 min  Charges:  $Gait Training: 8-22 mins                    G CodesElie Confer [place], [place]  [phone] 11/24/2014, 11:14 AM

## 2014-11-24 NOTE — Progress Notes (Signed)
Disempacted patient of significant amount of hard, clay-like stool. Patient tolerated procedure well.

## 2014-11-24 NOTE — Progress Notes (Signed)
Occupational Therapy Treatment Patient Details Name: Bridget Martinez MRN: [id number] DOB: 22-Feb-1951 Today's Date: 11/24/2014    History of present illness Pt is a 64 yo female who sustained a mechanical fall and underwent a L anterior direct THA. pt with h/o of uncontrolled diabetes, depression and heart history.   OT comments  Patient progressing towards goals, continue plan of care for now. Patient's BP more stable today and able to tolerate sit<>stands and short distance functional ambulation.    Follow Up Recommendations  SNF;Supervision/Assistance - 24 hour    Equipment Recommendations   (TBD)    Recommendations for Other Services  None at this time     Precautions / Restrictions Precautions Precautions: Fall Precaution Comments: Watch BP Restrictions Weight Bearing Restrictions: Yes LLE Weight Bearing: Weight bearing as tolerated       Mobility Bed Mobility Overal bed mobility: Needs Assistance Bed Mobility: Rolling;Sidelying to Sit Rolling: Min assist Sidelying to sit: Mod assist;+2 for physical assistance       General bed mobility comments: HOB slightly raised and patient heavily using bed rails and hand of therapist  Transfers Overall transfer level: Needs assistance Equipment used: Rolling walker (2 wheeled) Transfers: Sit to/from Stand Sit to Stand: Min assist;+2 physical assistance         General transfer comment: Patient able to follow multiple step commands with increased time during functional transfers and mobility     Balance Overall balance assessment: Needs assistance Sitting-balance support: Bilateral upper extremity supported;Feet supported Sitting balance-Leahy Scale: Fair   Postural control: Posterior lean Standing balance support: Bilateral upper extremity supported Standing balance-Leahy Scale: Poor Standing balance comment: Cues required to maintain upright standing balance. Patient required tactile redirecting at times as well.     ADL   General ADL Comments: Patient limited due to pain and overall decreased strength/endurance. Patient with complaints of dizziness during session. BP checked in supine = 105/36, seated EOB=96/50 , after short distance ambulation=104/55 . Patient sat EOB to brush hair and with increased fatigue/decreased endurance, "I just feel weak". Once patient seated in recliner, engaged her in BUE strengthening exercises to increase her overall activity tolerance/endurance and strength.      Cognition   Behavior During Therapy: Flat affect Overall Cognitive Status: Within Functional Limits for tasks assessed      Exercises General Exercises - Upper Extremity Shoulder Flexion: AROM;Left;10 reps;Seated Elbow Flexion: AROM;Right;Left;10 reps;Seated Elbow Extension: AROM;Right;Left;10 reps;Seated Digit Composite Flexion: AROM;Right;Left;Seated Composite Extension: AROM;Right;Left;Seated           Pertinent Vitals/ Pain       Pain Assessment: 0-10 Pain Score: 6  Pain Location: left hip Pain Descriptors / Indicators: Sore Pain Intervention(s): Monitored during session;Repositioned         Frequency Min 2X/week     Progress Toward Goals  OT Goals(current goals can now be found in the care plan section)  Progress towards OT goals: Progressing toward goals     Plan Discharge plan remains appropriate       End of Session Equipment Utilized During Treatment: Rolling walker;Gait belt   Activity Tolerance Patient tolerated treatment well   Patient Left in chair;with call bell/phone within reach;with chair alarm set     Time: [id number] OT Time Calculation (min): 25 min  Charges: OT General Charges $OT Visit: 1 Procedure OT Treatments $Self Care/Home Management : 8-22 mins  Johneisha Broaden , MS, OTR/L, CLT Pager: [phone]  11/24/2014, 10:36 AM

## 2014-11-25 DIAGNOSIS — D509 Iron deficiency anemia, unspecified: Secondary | ICD-10-CM

## 2014-11-25 LAB — CBC
HCT: 24.5 % — ABNORMAL LOW (ref 36.0–46.0)
Hemoglobin: 8.2 g/dL — ABNORMAL LOW (ref 12.0–15.0)
MCH: 28.8 pg (ref 26.0–34.0)
MCHC: 33.5 g/dL (ref 30.0–36.0)
MCV: 86 fL (ref 78.0–100.0)
PLATELETS: 215 10*3/uL (ref 150–400)
RBC: 2.85 MIL/uL — ABNORMAL LOW (ref 3.87–5.11)
RDW: 15.1 % (ref 11.5–15.5)
WBC: 7.2 10*3/uL (ref 4.0–10.5)

## 2014-11-25 LAB — GLUCOSE, CAPILLARY
GLUCOSE-CAPILLARY: 295 mg/dL — AB (ref 70–99)
GLUCOSE-CAPILLARY: 287 mg/dL — AB (ref 70–99)
Glucose-Capillary: 138 mg/dL — ABNORMAL HIGH (ref 70–99)
Glucose-Capillary: 237 mg/dL — ABNORMAL HIGH (ref 70–99)
Glucose-Capillary: 75 mg/dL (ref 70–99)
Glucose-Capillary: 80 mg/dL (ref 70–99)

## 2014-11-25 LAB — TYPE AND SCREEN
ABO/RH(D): B POS
Antibody Screen: NEGATIVE
UNIT DIVISION: 0
Unit division: 0

## 2014-11-25 LAB — BASIC METABOLIC PANEL
Anion gap: 7 (ref 5–15)
BUN: 15 mg/dL (ref 6–23)
CO2: 27 mmol/L (ref 19–32)
Calcium: 9.2 mg/dL (ref 8.4–10.5)
Chloride: 101 mmol/L (ref 96–112)
Creatinine, Ser: 1.24 mg/dL — ABNORMAL HIGH (ref 0.50–1.10)
GFR calc Af Amer: 52 mL/min — ABNORMAL LOW (ref >90)
GFR, EST NON AFRICAN AMERICAN: 45 mL/min — AB (ref >90)
Glucose, Bld: 63 mg/dL — ABNORMAL LOW (ref 70–99)
POTASSIUM: 3 mmol/L — AB (ref 3.5–5.1)
SODIUM: 135 mmol/L (ref 135–145)

## 2014-11-25 LAB — MAGNESIUM: Magnesium: 2.1 mg/dL (ref 1.5–2.5)

## 2014-11-25 LAB — PREPARE RBC (CROSSMATCH)

## 2014-11-25 LAB — CORTISOL: Cortisol, Plasma: 20.6 ug/dL

## 2014-11-25 MED ORDER — FERROUS SULFATE 325 (65 FE) MG PO TABS
325.0000 mg | ORAL_TABLET | Freq: Two times a day (BID) | ORAL | Status: DC
  Administered 2014-11-26 – 2014-11-28 (×6): 325 mg via ORAL
  Filled 2014-11-25 (×6): qty 1

## 2014-11-25 MED ORDER — POTASSIUM CHLORIDE CRYS ER 20 MEQ PO TBCR
40.0000 meq | EXTENDED_RELEASE_TABLET | ORAL | Status: AC
  Administered 2014-11-25 (×2): 40 meq via ORAL
  Filled 2014-11-25 (×2): qty 2

## 2014-11-25 MED ORDER — INSULIN DETEMIR 100 UNIT/ML ~~LOC~~ SOLN
5.0000 [IU] | Freq: Every day | SUBCUTANEOUS | Status: DC
  Administered 2014-11-25: 5 [IU] via SUBCUTANEOUS
  Filled 2014-11-25 (×2): qty 0.05

## 2014-11-25 MED ORDER — SODIUM CHLORIDE 0.9 % IV SOLN: Freq: Once | INTRAVENOUS | Status: AC

## 2014-11-25 MED ORDER — ASPIRIN 325 MG PO TABS
325.0000 mg | ORAL_TABLET | Freq: Two times a day (BID) | ORAL | Status: DC
  Administered 2014-11-25 – 2014-11-28 (×7): 325 mg via ORAL
  Filled 2014-11-25 (×7): qty 1

## 2014-11-25 MED ORDER — FLUCONAZOLE 100 MG PO TABS
100.0000 mg | ORAL_TABLET | Freq: Every day | ORAL | Status: DC
  Administered 2014-11-25 – 2014-11-28 (×4): 100 mg via ORAL
  Filled 2014-11-25 (×4): qty 1

## 2014-11-25 NOTE — Progress Notes (Signed)
PROGRESS NOTE  Bridget Martinez [id number] DOB: 17-Nov-1950 DOA: 11/19/2014 PCP: [name], FNP  History of present illness Bridget Martinez is a 64 y.o. 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 Patient reports that she had fall when she was working in kitchen at home at about 7:30 PM. She did not pass out, no chest pain or palpitation before the event. It was most likely a mechanical fall per her husband. She had a small amount of bleeding from a small scalp laceration of her head. She developed moderate pain over left hip. She could not stand on left leg without pain. No neck pain. Patient denied fever, chills, headaches, cough, chest pain, SOB, abdominal pain, diarrhea, constipation, dysuria, urgency, frequency, hematuria, skin rashes or leg swelling. No unilateral weakness, numbness or tingling sensations. No vision change or hearing loss. In ED, patient was found to have mildly displaced transcervical fracture through the left femoral neck by X-ray. CT-head is negative for acute abnormalities. Hypokalemia, negative troponin, stable hemoglobin, stable renal function. Patient was admitted to inpatient for further evaluation and treatment. Orthopedic surgery was consulted by ED.  Subjective: - frail elderly female, looks older than states age, report feeling somewhat stronger today, bp still borderline.  Assessment/Plan:  Left femoral neck fracture status post mechanical fall - Per patient's family, she had been home only for few days from the skilled nursing facility  - Orthopedic surgery consulted and patient underwent total hip arthroplasty 3/14 - Hemoglobin 8.1 3/13, transfused 1 unit packed RBC prior to the surgery, IV Lasix 20  mg x1 after the blood transfusion, stable post op -ortho to address when the patient is stable to restart plavix from surgical stand point. And type of DVT prophylaxis and duration  Acute encephalopathy - intermittent confusion 3/15 morning, after pain medications - obtained MRI brain also given possible focal RLE weakness (non surgical leg), negative - minimize narcotics and muscle relaxants  -better , aaox3  Diabetes mellitus-type I - Patient has a history of DKA. Poorly controlled diabetes. Recent A1c was 10.0 on 10/26/14.  - CBGs difficult to control as she is very brittle, required glucostabilizer 3/13 prior to surgery, now improved - significant difficulty controlling the blood sugars due to gastroparesis. - continue Levemir and SSI, adjust as needed  Diabetic gastroparesis: - continue Reglan - monitor QTc  CKD-III:  - Baseline creatinine 1.4-1.6.  - Currently renal function stable  Combined systolic and diastolic Congestive heart failure:  - 2-D echo on 10/29/14 showed EF 30-35% with grade 2 diastolic dysfunction, currently compensated however on IV fluids, avoid fluid overload - Continue aspirin, coreg and spironolactone - Lasix after the transfusion - careful with fluids, may need IV diuresis after surgery  - weight 127 > 136 >> 144>>145  - borderline hypotensive, coreg on hold  Coronary artery disease:  - Post status of CABG in 2013. Patient does not have chest pain. - Continue aspirin, Coreg, Crestor - restart Plavix per ortho post op  Hyperlipidemia:  - LDL was at 172 on 09/27/13 - Continue Crestor  Severe protein calorie malnutrition - nutrition supplementation. -start megace to boost appetite Hypokalemia / Hypomagnesemia - replete as needed  Anemia:  - iron deficiency, start iron supplement - s/p prbcx2 on 3/13 and 3/14 -will give another prbc on 3/18  for symptomatic anemia, still weak and borderline bp.  Hypothyroidism:  - TSH was 2.7 on 07/26/14 -  Continued on home regimen of Synthroid.  FTT: very frail,Per chart review patient has been in and out of hospital since 07/2014.   will change diuretics to prn, currently looks dry on physical exam. cxr unremarkable ua showed yeast, start diflucan, final urine culture pending.   Code Status: Full code DVT Prophylaxis: Subcutaneous heparin Family Communication: patient  Disposition: skilled nursing facility  Consultants:  Ortho- Dr Ninfa Linden  Procedures: [place] (Left) 3/14 prbc transfusion 3/13, 3/14, 3/18  Antibiotics:  Diflucan from 3/17  Objective: Weight change: 1.678 kg (3 lb 11.2 oz)  Intake/Output Summary (Last 24 hours) at 11/25/14 1917 Last data filed at 11/25/14 1645  Gross per 24 hour  Intake    620 ml  Output   1500 ml  Net   -880 ml   Blood pressure 103/36, pulse 94, temperature 98.9 F (37.2 C), temperature source Oral, resp. rate 18, height 5' 3.5" (1.613 m), weight 64.819 kg (142 lb 14.4 oz), SpO2 100 %.  Physical Exam: General: Alert and awake, oriented x3, not in any acute distress. Very frail HEENT: anicteric sclera, PERLA, EOMI, small scalp laceration CVS: S1-S2 clear, no murmur rubs or gallops, 1+ LE edema Chest: no wheezing Abdomen: soft nontender, nondistended, normal bowel sounds  Extremities: no cyanosis, clubbing, post op change left hip Neuro: no acute findings, poor effort  Lab Results: Basic Metabolic Panel:  Recent Labs Lab 11/24/14 0535  11/25/14 0534  NA 133*  --  135  K 3.3*  --  3.0*  CL 100  --  101  CO2 24  --  27  GLUCOSE 119*  --  63*  BUN 18  --  15  CREATININE 1.36*  --  1.24*  CALCIUM 8.6  --  9.2  MG  --   < > 2.1  < > = values in this interval not displayed.  Liver Function Tests:  Recent Labs Lab 11/20/14 0535 11/20/14 1134  AST 18 22  ALT 18 19  ALKPHOS 100 100  BILITOT 1.6* 1.2  PROT 6.4 6.5  ALBUMIN 3.7 3.7   CBC:  Recent Labs Lab 11/20/14 0150   11/24/14 0535 11/25/14 0534  WBC 12.7*  < > 7.3 7.2  NEUTROABS 10.9*  --   --   --   HGB 8.3*  < > 8.1* 8.2*  HCT 24.4*  < > 24.5* 24.5*  MCV 90.7  < > 87.2 86.0  PLT 277  < > 197 215  < > = values in this interval not displayed. CBG:  Recent Labs Lab 11/25/14 0024 11/25/14 0408 11/25/14 0731 11/25/14 1200 11/25/14 1639  GLUCAP 138* 80 75 295* 237*   Micro Results: Recent Results (from the past 240 hour(s))  Surgical pcr screen     Status: Abnormal   Collection Time: 11/20/14 11:28 AM  Result Value Ref Range Status   MRSA, PCR NEGATIVE NEGATIVE Final   Staphylococcus aureus POSITIVE (A) NEGATIVE Final    Comment:        The Xpert SA Assay (FDA approved for NASAL specimens in patients over 95 years of age), is one component of a comprehensive surveillance program.  Test performance has been validated by [facility] for patients greater than or equal to 85 year old. It is not intended to diagnose infection nor to guide or monitor treatment. Performed at [facility]  Studies/Results: Ct Head Wo Contrast  11/19/2014   CLINICAL DATA:  Head injury after fall. Possible loss of consciousness.  EXAM: CT HEAD WITHOUT CONTRAST  TECHNIQUE: Contiguous axial images were obtained from the base of the skull through the vertex without intravenous contrast.  COMPARISON:  07/26/2014  FINDINGS: No intracranial hemorrhage, mass effect, or midline shift. No hydrocephalus. The basilar cisterns are patent. No evidence of territorial infarct. No intracranial fluid collection. Mild generalized atrophy. Calvarium is intact. Unchanged chronic calcification adjacent to the inner table of left frontal skull. Included paranasal sinuses and mastoid air cells are well aerated.  IMPRESSION: No acute intracranial abnormality.   Electronically Signed   By: Jeb Levering M.D.   On: 11/19/2014 22:49   Mr Brain Wo Contrast  11/22/2014   CLINICAL DATA:  Confusion.  EXAM: MRI HEAD WITHOUT  CONTRAST  TECHNIQUE: Multiplanar, multiecho pulse sequences of the brain and surrounding structures were obtained without intravenous contrast.  COMPARISON:  Head CT 11/19/2014  FINDINGS: There is no evidence of acute infarct, intracranial hemorrhage, intra-axial mass, midline shift, or extra-axial fluid collection. There is mild generalized cerebral atrophy. Periventricular white matter T2 hyperintensities are nonspecific but compatible with mild chronic small vessel ischemic disease. 1.5 cm focus of chronic ossification along the inner table of the left frontal skull is unchanged; underlying meningioma not excluded.  Orbits are unremarkable. Paranasal sinuses and mastoid air cells are clear. Major intracranial vascular flow voids are preserved.  IMPRESSION: 1. No acute intracranial abnormality. 2. Mild chronic small vessel ischemic disease and cerebral atrophy.   Electronically Signed   By: Logan Bores   On: 11/22/2014 13:00   Dg Chest Port 1 View  11/24/2014   CLINICAL DATA:  F/u for CHF - recent hip surgery for fracture - hx of htn, diabetes, MI with open heart surgery, tuberculosis, GERD - pt says not having any issues today  EXAM: PORTABLE CHEST - 1 VIEW  COMPARISON:  10/26/2014  FINDINGS: Sternotomy wires unchanged. Right-sided PICC line present with tip at the cavoatrial junction. Lungs are adequately inflated and otherwise clear. Borderline stable cardiomegaly. Remainder of the exam is unchanged. Lungs are  IMPRESSION: No acute cardiopulmonary disease.  Stable borderline cardiomegaly.  Right-sided PICC line in adequate position.   Electronically Signed   By: Marin Olp M.D.   On: 11/24/2014 12:13   Dg Hip Port Unilat With Pelvis 1v Left  11/21/2014   CLINICAL DATA:  Postoperative exam after left total hip replacement  EXAM: LEFT HIP (WITH PELVIS) 1 VIEW PORTABLE  COMPARISON:  None.  FINDINGS: Expected postoperative appearance after left total hip arthroplasty. No evidence for hardware failure. No  fracture line visualized. Surgical staples are present. Clips project over the right upper thigh soft tissues.  IMPRESSION: Expected postoperative appearance after left total hip arthroplasty.   Electronically Signed   By: Conchita Paris M.D.   On: 11/21/2014 18:21   Dg Hip Operative Unilat With Pelvis Left  11/21/2014   CLINICAL DATA:  Total left hip arthroplasty.  EXAM: OPERATIVE LEFT HIP (WITH PELVIS IF PERFORMED) 2 VIEWS  TECHNIQUE: Fluoroscopic spot image(s) were submitted for interpretation post-operatively.  COMPARISON:  None.  FINDINGS: Two intraoperative spot images demonstrate changes of left hip replacement. Normal AP alignment. No hardware or bony complicating feature visualized.  IMPRESSION: Left hip replacement without visible complicating feature on these intraoperative spot images.   Electronically Signed   By: Rolm Baptise M.D.   On: 11/21/2014 16:19   Dg Hips Bilat  With Pelvis Min 5 Views  11/19/2014   CLINICAL DATA:  Status post fall, with bilateral hip pain, and bruising about the hips and groin. Initial encounter.  EXAM: BILATERAL HIP (WITH PELVIS) 5-6 VIEWS  COMPARISON:  None.  FINDINGS: There is a mildly displaced transcervical fracture through the left femoral neck, with superior displacement of the distal femur and mild rotation of the left femoral head. No additional fractures are seen. Both femoral heads remain seated within their respective acetabuli. The right hip joint is grossly unremarkable in appearance.  Mild sclerotic change is seen at the sacroiliac joints. Clips are noted overlying the right inguinal region. Scattered vascular calcifications are seen. The visualized bowel gas pattern is grossly unremarkable.  IMPRESSION: 1. Mildly displaced transcervical fracture through the left femoral neck. 2. Scattered vascular calcifications seen.   Electronically Signed   By: Garald Balding M.D.   On: 11/19/2014 22:54    Medications: Scheduled Meds: . sodium chloride    Intravenous Once  . sodium chloride   Intravenous Once  . sodium chloride   Intravenous Once  . aspirin  325 mg Oral BID PC  . carvedilol  3.125 mg Oral BID WC  . escitalopram  10 mg Oral Daily  . [START ON 11/26/2014] ferrous sulfate  325 mg Oral BID WC  . fluconazole  100 mg Oral Daily  . heparin  5,000 Units Subcutaneous 3 times per day  . insulin aspart  0-5 Units Subcutaneous QHS  . insulin aspart  0-9 Units Subcutaneous TID WC  . insulin aspart  3 Units Subcutaneous TID WC  . insulin detemir  5 Units Subcutaneous QHS  . levothyroxine  125 mcg Oral QAC breakfast  . megestrol  400 mg Oral BID  . metoCLOPramide  5 mg Oral TID AC & HS  . pantoprazole sodium  40 mg Oral BID  . polyethylene glycol  17 g Oral Daily  . protein supplement  1 packet Oral BID BM  . rosuvastatin  40 mg Oral Daily  . senna-docusate  2 tablet Oral BID  . sodium chloride  10-40 mL Intracatheter Q12H  . sodium chloride  3 mL Intravenous Q12H  . spironolactone  25 mg Oral Daily     LOS: 6 days    Heiley Shaikh M.D. Ph.D [facility] 11/25/2014, 7:17 PM Pager: [phone]  If 7PM-7AM, please contact night-coverage [email or web address] Password [email or web address]

## 2014-11-25 NOTE — Clinical Social Work Note (Signed)
Updated clinicals provided to [facility]. CSW continuing to follow to assist with discharge planning.  [name], [place] ([phone]) Licensed Clinical Social Worker Orthopedics [phone]) and Surgical [phone])

## 2014-11-25 NOTE — Progress Notes (Signed)
Inpatient Diabetes Program Recommendations  AACE/ADA: New Consensus Statement on Inpatient Glycemic Control (2013)  Target Ranges:  Prepandial:   less than 140 mg/dL      Peak postprandial:   less than 180 mg/dL (1-2 hours)      Critically ill patients:  140 - 180 mg/dL   Results for ATHALENE, UTHOFF (MRN [id number]) as of 11/25/2014 11:55  Ref. Range 11/24/2014 04:06 11/24/2014 11:15 11/24/2014 15:54 11/24/2014 20:17 11/25/2014 00:24 11/25/2014 04:08 11/25/2014 07:31  Glucose-Capillary Latest Range: 70-99 mg/dL 112 (H) 260 (H) 240 (H) 215 (H) 138 (H) 80 75    Diabetes history: DM1 Outpatient Diabetes medications: Levemir 10 units daily, Novolog 8 units TID with meals Current orders for Inpatient glycemic control: Levemir 5 units QHS, Novolog 0-9 units TID with meals, Novolog 0-5 units HS, Novolog 3 units TID with meals  Inpatient Diabetes Program Recommendations Insulin - Basal: Noted fasting glucose 75 mg/dl at 7:31 this morning and Levemir was decreased to 5 units QHS. If fasting is greater than 180 mg/dl in the morning, may want to consider increasing Levemir to 7 units QHS. Insulin - Meal Coverage: Post prandial glucose is consistently elevated. Please consider increasing meal coverage to Novolog 5 units TID with meals if patient is eating at least 50% of meals.  Thanks, Barnie Alderman, RN, MSN, CCRN, CDE Diabetes Coordinator Inpatient Diabetes Program [phone] (Team Pager from [place] to [place]) [phone] ([place] office) [phone] [facility] office)

## 2014-11-26 LAB — CBC
HEMATOCRIT: 26.9 % — AB (ref 36.0–46.0)
Hemoglobin: 9.2 g/dL — ABNORMAL LOW (ref 12.0–15.0)
MCH: 30.1 pg (ref 26.0–34.0)
MCHC: 34.2 g/dL (ref 30.0–36.0)
MCV: 87.9 fL (ref 78.0–100.0)
Platelets: 209 10*3/uL (ref 150–400)
RBC: 3.06 MIL/uL — ABNORMAL LOW (ref 3.87–5.11)
RDW: 14.9 % (ref 11.5–15.5)
WBC: 7.7 10*3/uL (ref 4.0–10.5)

## 2014-11-26 LAB — URINE CULTURE: Colony Count: 6000

## 2014-11-26 LAB — GLUCOSE, CAPILLARY
Glucose-Capillary: 309 mg/dL — ABNORMAL HIGH (ref 70–99)
Glucose-Capillary: 448 mg/dL — ABNORMAL HIGH (ref 70–99)
Glucose-Capillary: 304 mg/dL — ABNORMAL HIGH (ref 70–99)
Glucose-Capillary: 228 mg/dL — ABNORMAL HIGH (ref 70–99)

## 2014-11-26 LAB — BASIC METABOLIC PANEL
ANION GAP: 8 (ref 5–15)
BUN: 15 mg/dL (ref 6–23)
CALCIUM: 8.7 mg/dL (ref 8.4–10.5)
CO2: 23 mmol/L (ref 19–32)
CREATININE: 1.36 mg/dL — AB (ref 0.50–1.10)
Chloride: 99 mmol/L (ref 96–112)
GFR, EST AFRICAN AMERICAN: 47 mL/min — AB (ref >90)
GFR, EST NON AFRICAN AMERICAN: 40 mL/min — AB (ref >90)
GLUCOSE: 314 mg/dL — AB (ref 70–99)
Potassium: 4.5 mmol/L (ref 3.5–5.1)
Sodium: 130 mmol/L — ABNORMAL LOW (ref 135–145)

## 2014-11-26 LAB — TYPE AND SCREEN
ABO/RH(D): B POS
ANTIBODY SCREEN: NEGATIVE
Unit division: 0

## 2014-11-26 MED ORDER — INSULIN DETEMIR 100 UNIT/ML ~~LOC~~ SOLN
10.0000 [IU] | Freq: Every day | SUBCUTANEOUS | Status: DC
  Administered 2014-11-26: 10 [IU] via SUBCUTANEOUS
  Filled 2014-11-26 (×2): qty 0.1

## 2014-11-26 NOTE — Progress Notes (Signed)
Pt doing well - min pain in hip Needs PT today SNF next week

## 2014-11-26 NOTE — Progress Notes (Signed)
Patient's blood glucose 448 at 1229. MD notified. Shortly, 12 units of Novolog was provided. Blood glucose 304 at 1504. Will continue to monitor patient.

## 2014-11-26 NOTE — Progress Notes (Signed)
Pt turned and perineal hygiene performed, red bottom noted. Blanchable right and left buttocks, sacral breakdown, pink area.

## 2014-11-26 NOTE — Progress Notes (Signed)
PROGRESS NOTE  NAYDELI LAMBERT [id number] DOB: 03/02/1951 DOA: 11/19/2014 PCP: [name], FNP  History of present illness JACQULIN SWIERCZYNSKI is a 64 y.o. 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 Patient reports that she had fall when she was working in kitchen at home at about 7:30 PM. She did not pass out, no chest pain or palpitation before the event. It was most likely a mechanical fall per her husband. She had a small amount of bleeding from a small scalp laceration of her head. She developed moderate pain over left hip. She could not stand on left leg without pain. No neck pain. Patient denied fever, chills, headaches, cough, chest pain, SOB, abdominal pain, diarrhea, constipation, dysuria, urgency, frequency, hematuria, skin rashes or leg swelling. No unilateral weakness, numbness or tingling sensations. No vision change or hearing loss. In ED, patient was found to have mildly displaced transcervical fracture through the left femoral neck by X-ray. CT-head is negative for acute abnormalities. Hypokalemia, negative troponin, stable hemoglobin, stable renal function. Patient was admitted to inpatient for further evaluation and treatment. Orthopedic surgery was consulted by ED.  Subjective: - frail elderly female, looks older than states age, report feeling somewhat stronger today,  Blood sugar elevated, daughter and husband at bedside, concerned about patient being weak,  Assessment/Plan:  Left femoral neck fracture status post mechanical fall - Per patient's family, she had been home only for few days from the skilled nursing facility  - Orthopedic surgery consulted and patient underwent total hip arthroplasty 3/14 - discussed  with ortho Dr. Marlou Sa at [id number], he advised to continue hold plavix, advised ortho will address plavix use at post op follow up appointment. Patient is continued on asa bid per ortho order for postop DVT prophylaxis.   Acute encephalopathy - intermittent confusion 3/15 morning, after pain medications - obtained MRI brain also given possible focal RLE weakness (non surgical leg), negative - minimize narcotics and muscle relaxants  -better , aaox3  Diabetes mellitus-type I - Patient has a history of DKA. Poorly controlled diabetes. Recent A1c was 10.0 on 10/26/14.  - CBGs difficult to control as she is very brittle, required glucostabilizer 3/13 prior to surgery, now improved - significant difficulty controlling the blood sugars due to gastroparesis. - continue Levemir and SSI, adjust as needed -fluctuating blood sugar due to inconsistency of oral intake.   Diabetic gastroparesis: - continue Reglan - monitor QTc  CKD-III:  - Baseline creatinine 1.4-1.6.  - Currently renal function stable  Combined systolic and diastolic Congestive heart failure:  - 2-D echo on 10/29/14 showed EF 30-35% with grade 2 diastolic dysfunction, currently compensated however on IV fluids, avoid fluid overload - Continue aspirin, coreg and spironolactone - Lasix after the transfusion - careful with fluids, may need IV diuresis after surgery  - weight 127 > 136 >> 144>>145  - borderline hypotensive, coreg on hold intermittently while in the hospital bp better on 3/19  Coronary artery disease:  - Post status of CABG in 2013. Patient does not have chest pain. - Continue aspirin, Coreg, Crestor - restart Plavix per ortho post op -discussed with family, family reported that patient has not been taking plavix for months, however, per Cardiology note on 11/15/2014 ,patient should be on plavix, instruct patient to follow up with cardiology  to discuss plavix use. For now plavix on hold per surgery. Family including  daughter expressed understanding.  Hyperlipidemia:  - LDL was at 172 on 09/27/13 - Continue Crestor  Severe protein calorie malnutrition - nutrition supplementation. -start megace to boost appetite  Hypokalemia / Hypomagnesemia - replete as needed  Anemia:  - iron deficiency, start iron supplement - s/p prbcx2 on 3/13 and 3/14 -will give another prbc on 3/18 for symptomatic anemia, bp better on 3/19  Hypothyroidism:  - TSH was 2.7 on 07/26/14 - Continued on home regimen of Synthroid.  FTT: very frail,Per chart review patient has been in and out of hospital since 07/2014.   will change diuretics to prn, currently looks dry on physical exam. cxr unremarkable ua showed yeast, start diflucan, final urine culture pending.   Code Status: Full code DVT Prophylaxis: Subcutaneous heparin Family Communication: patient /husband and daughter  Disposition: skilled nursing facility  Consultants:  Ortho- Dr Ninfa Linden  Procedures: Ali Chuk (Left) 3/14 prbc transfusion 3/13, 3/14, 3/18  Antibiotics:  Diflucan from 3/17  Objective: Weight change: 0.272 kg (9.6 oz)  Intake/Output Summary (Last 24 hours) at 11/26/14 1244 Last data filed at 11/26/14 1000  Gross per 24 hour  Intake    745 ml  Output   1750 ml  Net  -1005 ml   Blood pressure 109/30, pulse 84, temperature 98.9 F (37.2 C), temperature source Oral, resp. rate 17, height 5' 3.5" (1.613 m), weight 65.091 kg (143 lb 8 oz), SpO2 98 %.  Physical Exam: General: Alert and awake, oriented x3, not in any acute distress. Very frail, appear older than stated age. HEENT: anicteric sclera, PERLA, EOMI, small scalp laceration CVS: S1-S2 clear, no murmur rubs or gallops,  Chest: no wheezing Abdomen: soft nontender, nondistended, normal bowel sounds  Extremities: no cyanosis, clubbing, post op change left hip. No edema Neuro: no acute findings, poor effort  Lab Results: Basic Metabolic  Panel:  Recent Labs Lab 11/25/14 0534 11/26/14 0445  NA 135 130*  K 3.0* 4.5  CL 101 99  CO2 27 23  GLUCOSE 63* 314*  BUN 15 15  CREATININE 1.24* 1.36*  CALCIUM 9.2 8.7  MG 2.1  --     Liver Function Tests:  Recent Labs Lab 11/20/14 0535 11/20/14 1134  AST 18 22  ALT 18 19  ALKPHOS 100 100  BILITOT 1.6* 1.2  PROT 6.4 6.5  ALBUMIN 3.7 3.7   CBC:  Recent Labs Lab 11/20/14 0150  11/25/14 0534 11/26/14 0445  WBC 12.7*  < > 7.2 7.7  NEUTROABS 10.9*  --   --   --   HGB 8.3*  < > 8.2* 9.2*  HCT 24.4*  < > 24.5* 26.9*  MCV 90.7  < > 86.0 87.9  PLT 277  < > 215 209  < > = values in this interval not displayed. CBG:  Recent Labs Lab 11/25/14 1200 11/25/14 1639 11/25/14 2200 11/26/14 0701 11/26/14 1229  GLUCAP 295* 237* 287* 309* 448*   Micro Results: Recent Results (from the past 240 hour(s))  Surgical pcr screen     Status: Abnormal   Collection Time: 11/20/14 11:28 AM  Result Value Ref Range Status   MRSA, PCR NEGATIVE NEGATIVE Final   Staphylococcus aureus POSITIVE (A) NEGATIVE Final    Comment:        The Xpert SA Assay (FDA approved for NASAL specimens in patients over 73 years of age), is one component of a comprehensive surveillance  program.  Test performance has been validated by [facility] for patients greater than or equal to 31 year old. It is not intended to diagnose infection nor to guide or monitor treatment. Performed at [facility]    Studies/Results: Ct Head Wo Contrast  11/19/2014   CLINICAL DATA:  Head injury after fall. Possible loss of consciousness.  EXAM: CT HEAD WITHOUT CONTRAST  TECHNIQUE: Contiguous axial images were obtained from the base of the skull through the vertex without intravenous contrast.  COMPARISON:  07/26/2014  FINDINGS: No intracranial hemorrhage, mass effect, or midline shift. No hydrocephalus. The basilar cisterns are patent. No evidence of territorial infarct. No intracranial fluid collection.  Mild generalized atrophy. Calvarium is intact. Unchanged chronic calcification adjacent to the inner table of left frontal skull. Included paranasal sinuses and mastoid air cells are well aerated.  IMPRESSION: No acute intracranial abnormality.   Electronically Signed   By: Jeb Levering M.D.   On: 11/19/2014 22:49   Mr Brain Wo Contrast  11/22/2014   CLINICAL DATA:  Confusion.  EXAM: MRI HEAD WITHOUT CONTRAST  TECHNIQUE: Multiplanar, multiecho pulse sequences of the brain and surrounding structures were obtained without intravenous contrast.  COMPARISON:  Head CT 11/19/2014  FINDINGS: There is no evidence of acute infarct, intracranial hemorrhage, intra-axial mass, midline shift, or extra-axial fluid collection. There is mild generalized cerebral atrophy. Periventricular white matter T2 hyperintensities are nonspecific but compatible with mild chronic small vessel ischemic disease. 1.5 cm focus of chronic ossification along the inner table of the left frontal skull is unchanged; underlying meningioma not excluded.  Orbits are unremarkable. Paranasal sinuses and mastoid air cells are clear. Major intracranial vascular flow voids are preserved.  IMPRESSION: 1. No acute intracranial abnormality. 2. Mild chronic small vessel ischemic disease and cerebral atrophy.   Electronically Signed   By: Logan Bores   On: 11/22/2014 13:00   Dg Chest Port 1 View  11/24/2014   CLINICAL DATA:  F/u for CHF - recent hip surgery for fracture - hx of htn, diabetes, MI with open heart surgery, tuberculosis, GERD - pt says not having any issues today  EXAM: PORTABLE CHEST - 1 VIEW  COMPARISON:  10/26/2014  FINDINGS: Sternotomy wires unchanged. Right-sided PICC line present with tip at the cavoatrial junction. Lungs are adequately inflated and otherwise clear. Borderline stable cardiomegaly. Remainder of the exam is unchanged. Lungs are  IMPRESSION: No acute cardiopulmonary disease.  Stable borderline cardiomegaly.  Right-sided  PICC line in adequate position.   Electronically Signed   By: Marin Olp M.D.   On: 11/24/2014 12:13   Dg Hip Port Unilat With Pelvis 1v Left  11/21/2014   CLINICAL DATA:  Postoperative exam after left total hip replacement  EXAM: LEFT HIP (WITH PELVIS) 1 VIEW PORTABLE  COMPARISON:  None.  FINDINGS: Expected postoperative appearance after left total hip arthroplasty. No evidence for hardware failure. No fracture line visualized. Surgical staples are present. Clips project over the right upper thigh soft tissues.  IMPRESSION: Expected postoperative appearance after left total hip arthroplasty.   Electronically Signed   By: Conchita Paris M.D.   On: 11/21/2014 18:21   Dg Hip Operative Unilat With Pelvis Left  11/21/2014   CLINICAL DATA:  Total left hip arthroplasty.  EXAM: OPERATIVE LEFT HIP (WITH PELVIS IF PERFORMED) 2 VIEWS  TECHNIQUE: Fluoroscopic spot image(s) were submitted for interpretation post-operatively.  COMPARISON:  None.  FINDINGS: Two intraoperative spot images demonstrate changes of left hip replacement. Normal AP alignment. No  hardware or bony complicating feature visualized.  IMPRESSION: Left hip replacement without visible complicating feature on these intraoperative spot images.   Electronically Signed   By: Rolm Baptise M.D.   On: 11/21/2014 16:19   Dg Hips Bilat With Pelvis Min 5 Views  11/19/2014   CLINICAL DATA:  Status post fall, with bilateral hip pain, and bruising about the hips and groin. Initial encounter.  EXAM: BILATERAL HIP (WITH PELVIS) 5-6 VIEWS  COMPARISON:  None.  FINDINGS: There is a mildly displaced transcervical fracture through the left femoral neck, with superior displacement of the distal femur and mild rotation of the left femoral head. No additional fractures are seen. Both femoral heads remain seated within their respective acetabuli. The right hip joint is grossly unremarkable in appearance.  Mild sclerotic change is seen at the sacroiliac joints. Clips are  noted overlying the right inguinal region. Scattered vascular calcifications are seen. The visualized bowel gas pattern is grossly unremarkable.  IMPRESSION: 1. Mildly displaced transcervical fracture through the left femoral neck. 2. Scattered vascular calcifications seen.   Electronically Signed   By: Garald Balding M.D.   On: 11/19/2014 22:54    Medications: Scheduled Meds: . sodium chloride   Intravenous Once  . sodium chloride   Intravenous Once  . aspirin  325 mg Oral BID PC  . carvedilol  3.125 mg Oral BID WC  . escitalopram  10 mg Oral Daily  . ferrous sulfate  325 mg Oral BID WC  . fluconazole  100 mg Oral Daily  . heparin  5,000 Units Subcutaneous 3 times per day  . insulin aspart  0-5 Units Subcutaneous QHS  . insulin aspart  0-9 Units Subcutaneous TID WC  . insulin aspart  3 Units Subcutaneous TID WC  . insulin detemir  10 Units Subcutaneous QHS  . levothyroxine  125 mcg Oral QAC breakfast  . megestrol  400 mg Oral BID  . metoCLOPramide  5 mg Oral TID AC & HS  . pantoprazole sodium  40 mg Oral BID  . polyethylene glycol  17 g Oral Daily  . protein supplement  1 packet Oral BID BM  . rosuvastatin  40 mg Oral Daily  . senna-docusate  2 tablet Oral BID  . sodium chloride  10-40 mL Intracatheter Q12H  . sodium chloride  3 mL Intravenous Q12H  . spironolactone  25 mg Oral Daily     LOS: 7 days    Zaccary Creech M.D. Ph.D [facility] 11/26/2014, 12:44 PM Pager: [phone]  If 7PM-7AM, please contact night-coverage [email or web address] Password [email or web address]

## 2014-11-27 LAB — BASIC METABOLIC PANEL
Anion gap: 10 (ref 5–15)
BUN: 23 mg/dL (ref 6–23)
CO2: 24 mmol/L (ref 19–32)
Calcium: 8.9 mg/dL (ref 8.4–10.5)
Chloride: 97 mmol/L (ref 96–112)
Creatinine, Ser: 1.38 mg/dL — ABNORMAL HIGH (ref 0.50–1.10)
GFR calc Af Amer: 46 mL/min — ABNORMAL LOW (ref >90)
GFR calc non Af Amer: 39 mL/min — ABNORMAL LOW (ref >90)
GLUCOSE: 300 mg/dL — AB (ref 70–99)
Potassium: 4.2 mmol/L (ref 3.5–5.1)
SODIUM: 131 mmol/L — AB (ref 135–145)

## 2014-11-27 LAB — GLUCOSE, CAPILLARY
GLUCOSE-CAPILLARY: 268 mg/dL — AB (ref 70–99)
GLUCOSE-CAPILLARY: 276 mg/dL — AB (ref 70–99)
Glucose-Capillary: 373 mg/dL — ABNORMAL HIGH (ref 70–99)
Glucose-Capillary: 194 mg/dL — ABNORMAL HIGH (ref 70–99)
Glucose-Capillary: 121 mg/dL — ABNORMAL HIGH (ref 70–99)

## 2014-11-27 LAB — MAGNESIUM: Magnesium: 1.7 mg/dL (ref 1.5–2.5)

## 2014-11-27 LAB — CBC
HCT: 27.5 % — ABNORMAL LOW (ref 36.0–46.0)
Hemoglobin: 9.3 g/dL — ABNORMAL LOW (ref 12.0–15.0)
MCH: 29.2 pg (ref 26.0–34.0)
MCHC: 33.8 g/dL (ref 30.0–36.0)
MCV: 86.5 fL (ref 78.0–100.0)
Platelets: 252 10*3/uL (ref 150–400)
RBC: 3.18 MIL/uL — ABNORMAL LOW (ref 3.87–5.11)
RDW: 15.1 % (ref 11.5–15.5)
WBC: 8.4 10*3/uL (ref 4.0–10.5)

## 2014-11-27 MED ORDER — MAGNESIUM SULFATE 2 GM/50ML IV SOLN
2.0000 g | Freq: Once | INTRAVENOUS | Status: AC
  Administered 2014-11-27: 2 g via INTRAVENOUS
  Filled 2014-11-27: qty 50

## 2014-11-27 MED ORDER — SPIRONOLACTONE 12.5 MG HALF TABLET
12.5000 mg | ORAL_TABLET | Freq: Every day | ORAL | Status: DC
  Administered 2014-11-28: 12.5 mg via ORAL
  Filled 2014-11-27: qty 1

## 2014-11-27 MED ORDER — INSULIN DETEMIR 100 UNIT/ML ~~LOC~~ SOLN
15.0000 [IU] | Freq: Every day | SUBCUTANEOUS | Status: DC
  Administered 2014-11-27: 15 [IU] via SUBCUTANEOUS
  Filled 2014-11-27 (×2): qty 0.15

## 2014-11-27 MED ORDER — MAGNESIUM OXIDE 400 (241.3 MG) MG PO TABS
400.0000 mg | ORAL_TABLET | Freq: Every day | ORAL | Status: DC
  Administered 2014-11-28: 400 mg via ORAL
  Filled 2014-11-27: qty 1

## 2014-11-27 MED ORDER — INSULIN ASPART 100 UNIT/ML ~~LOC~~ SOLN
5.0000 [IU] | Freq: Three times a day (TID) | SUBCUTANEOUS | Status: DC
Start: 2014-11-27 — End: 2014-11-28
  Administered 2014-11-27 – 2014-11-28 (×5): 5 [IU] via SUBCUTANEOUS

## 2014-11-27 NOTE — Progress Notes (Addendum)
PROGRESS NOTE  SELENY Martinez [id number] DOB: 1951-07-26 DOA: 11/19/2014 PCP: [name], FNP  History of present illness Bridget Martinez is a 64 y.o. 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 Patient reports that she had fall when she was working in kitchen at home at about 7:30 PM. She did not pass out, no chest pain or palpitation before the event. It was most likely a mechanical fall per her husband. She had a small amount of bleeding from a small scalp laceration of her head. She developed moderate pain over left hip. She could not stand on left leg without pain. No neck pain. Patient denied fever, chills, headaches, cough, chest pain, SOB, abdominal pain, diarrhea, constipation, dysuria, urgency, frequency, hematuria, skin rashes or leg swelling. No unilateral weakness, numbness or tingling sensations. No vision change or hearing loss. In ED, patient was found to have mildly displaced transcervical fracture through the left femoral neck by X-ray. CT-head is negative for acute abnormalities. Hypokalemia, negative troponin, stable hemoglobin, stable renal function. Patient was admitted to inpatient for further evaluation and treatment. Orthopedic surgery was consulted by ED.  Subjective: - frail elderly female, looks older than states age, report feeling somewhat stronger today, states want to try to get up today, but still want to keep foley in for one more day. Blood sugar remain elevated,  Assessment/Plan:  Left femoral neck fracture status post mechanical fall - Per patient's family, she had been home only for few days from the skilled nursing facility  - Orthopedic surgery consulted and patient underwent total hip  arthroplasty 3/14 - discussed with ortho Dr. Marlou Sa at [id number], he advised to continue hold plavix, advised ortho will address plavix use at post op follow up appointment. Patient is continued on asa bid per ortho order for postop DVT prophylaxis.   Acute encephalopathy - intermittent confusion 3/15 morning, after pain medications - obtained MRI brain also given possible focal RLE weakness (non surgical leg), negative - minimize narcotics and muscle relaxants  -better , aaox3  Diabetes mellitus-type I - Patient has a history of DKA. Poorly controlled diabetes. Recent A1c was 10.0 on 10/26/14.  - CBGs difficult to control as she is very brittle, required glucostabilizer 3/13 prior to surgery, now improved - significant difficulty controlling the blood sugars due to gastroparesis. - continue Levemir and SSI, adjust as needed -fluctuating blood sugar due to inconsistency of oral intake.  -am Bg still elevated with increasing night time coverage, will increase meal time coverage to 5units tid with meal, continue increase night time coverage to 15units qhs.  Diabetic gastroparesis: - continue Reglan - monitor QTc  CKD-III:  - Baseline creatinine 1.4-1.6.  - Currently renal function stable  Combined systolic and diastolic Congestive heart failure:  - 2-D echo on 10/29/14 showed EF 30-35% with grade 2 diastolic dysfunction, currently compensated however on IV fluids, avoid fluid overload - Continue aspirin, coreg and spironolactone - Lasix after the transfusion - careful with fluids, may need IV diuresis after surgery  - weight 127 > 136 >> 144>>145  - borderline hypotensive, coreg on hold intermittently while in the hospital bp better  Since 3/19, restarted low dose coreg, d/c lasix, decrease spironolactone to 12.5mg  po qd, check orthostatic vital sign 3/20  Coronary artery disease:  - Post status  of CABG in 2013. Patient does not have chest pain. - Continue aspirin, Coreg, Crestor -  restart Plavix per ortho post op -discussed with family, family reported that patient has not been taking plavix for months, however, per Cardiology note on 11/15/2014 ,patient should be on plavix, instruct patient to follow up with cardiology to discuss plavix use. For now plavix on hold per surgery. Family including daughter expressed understanding.  Hyperlipidemia:  - LDL was at 172 on 09/27/13 - Continue Crestor  Severe protein calorie malnutrition - nutrition supplementation. -start megace to boost appetite  Hypokalemia / Hypomagnesemia - replete as needed  Anemia:  - iron deficiency, start iron supplement - s/p prbcx2 on 3/13 and 3/14 -will give another prbc on 3/18 for symptomatic anemia, bp better on 3/19  Hypothyroidism:  - TSH was 2.7 on 07/26/14 - Continued on home regimen of Synthroid.  FTT: very frail,Per chart review patient has been in and out of hospital since 07/2014.    ua showed yeast, start diflucan, final urine culture no growth.   Code Status: Full code DVT Prophylaxis: Subcutaneous heparin Family Communication: patient /husband and daughter  Disposition: skilled nursing facility  Consultants:  Ortho- Dr Ninfa Linden  Procedures: [place] (Left) 3/14 prbc transfusion 3/13, 3/14, 3/18  Antibiotics:  Diflucan from 3/17  Objective: Weight change: -0.091 kg (-3.2 oz)  Intake/Output Summary (Last 24 hours) at 11/27/14 1116 Last data filed at 11/27/14 0506  Gross per 24 hour  Intake    120 ml  Output   1850 ml  Net  -1730 ml   Blood pressure 103/47, pulse 71, temperature 99.1 F (37.3 C), temperature source Oral, resp. rate 18, height 5' 3.5" (1.613 m), weight 65 kg (143 lb 4.8 oz), SpO2 97 %.  Physical Exam: General: Alert and awake, oriented x3, not in any acute distress. Very frail, appear older than stated age. HEENT: anicteric sclera, PERLA, EOMI, small scalp laceration CVS: S1-S2 clear, no murmur rubs or  gallops,  Chest: no wheezing Abdomen: soft nontender, nondistended, normal bowel sounds  Extremities: no cyanosis, clubbing, post op change left hip. No edema Neuro: no acute findings, poor effort  Lab Results: Basic Metabolic Panel:  Recent Labs Lab 11/26/14 0445 11/27/14 0500  NA 130* 131*  K 4.5 4.2  CL 99 97  CO2 23 24  GLUCOSE 314* 300*  BUN 15 23  CREATININE 1.36* 1.38*  CALCIUM 8.7 8.9  MG  --  1.7    Liver Function Tests:  Recent Labs Lab 11/20/14 1134  AST 22  ALT 19  ALKPHOS 100  BILITOT 1.2  PROT 6.5  ALBUMIN 3.7   CBC:  Recent Labs Lab 11/26/14 0445 11/27/14 0500  WBC 7.7 8.4  HGB 9.2* 9.3*  HCT 26.9* 27.5*  MCV 87.9 86.5  PLT 209 252   CBG:  Recent Labs Lab 11/26/14 1229 11/26/14 1441 11/26/14 1839 11/26/14 2149 11/27/14 0653  GLUCAP 448* 304* 268* 228* 276*   Micro Results: Recent Results (from the past 240 hour(s))  Surgical pcr screen     Status: Abnormal   Collection Time: 11/20/14 11:28 AM  Result Value Ref Range Status   MRSA, PCR NEGATIVE NEGATIVE Final   Staphylococcus aureus POSITIVE (A) NEGATIVE Final    Comment:        The Xpert SA Assay (FDA approved for NASAL specimens in patients over 79 years of age), is one component of a comprehensive surveillance program.  Test performance has been validated by  [place] for patients greater than or equal to 14 year old. It is not intended to diagnose infection nor to guide or monitor treatment. Performed at [facility]   Culture, Urine     Status: None   Collection Time: 11/24/14 12:10 PM  Result Value Ref Range Status   Specimen Description URINE, RANDOM  Final   Special Requests ADDED [id number]  Final   Colony Count   Final    6,000 COLONIES/ML Performed at [facility]    Culture   Final    INSIGNIFICANT GROWTH Performed at [facility]    Report Status 11/26/2014 FINAL  Final   Studies/Results: Ct Head Wo  Contrast  11/19/2014   CLINICAL DATA:  Head injury after fall. Possible loss of consciousness.  EXAM: CT HEAD WITHOUT CONTRAST  TECHNIQUE: Contiguous axial images were obtained from the base of the skull through the vertex without intravenous contrast.  COMPARISON:  07/26/2014  FINDINGS: No intracranial hemorrhage, mass effect, or midline shift. No hydrocephalus. The basilar cisterns are patent. No evidence of territorial infarct. No intracranial fluid collection. Mild generalized atrophy. Calvarium is intact. Unchanged chronic calcification adjacent to the inner table of left frontal skull. Included paranasal sinuses and mastoid air cells are well aerated.  IMPRESSION: No acute intracranial abnormality.   Electronically Signed   By: Jeb Levering M.D.   On: 11/19/2014 22:49   Mr Brain Wo Contrast  11/22/2014   CLINICAL DATA:  Confusion.  EXAM: MRI HEAD WITHOUT CONTRAST  TECHNIQUE: Multiplanar, multiecho pulse sequences of the brain and surrounding structures were obtained without intravenous contrast.  COMPARISON:  Head CT 11/19/2014  FINDINGS: There is no evidence of acute infarct, intracranial hemorrhage, intra-axial mass, midline shift, or extra-axial fluid collection. There is mild generalized cerebral atrophy. Periventricular white matter T2 hyperintensities are nonspecific but compatible with mild chronic small vessel ischemic disease. 1.5 cm focus of chronic ossification along the inner table of the left frontal skull is unchanged; underlying meningioma not excluded.  Orbits are unremarkable. Paranasal sinuses and mastoid air cells are clear. Major intracranial vascular flow voids are preserved.  IMPRESSION: 1. No acute intracranial abnormality. 2. Mild chronic small vessel ischemic disease and cerebral atrophy.   Electronically Signed   By: Logan Bores   On: 11/22/2014 13:00   Dg Chest Port 1 View  11/24/2014   CLINICAL DATA:  F/u for CHF - recent hip surgery for fracture - hx of htn, diabetes,  MI with open heart surgery, tuberculosis, GERD - pt says not having any issues today  EXAM: PORTABLE CHEST - 1 VIEW  COMPARISON:  10/26/2014  FINDINGS: Sternotomy wires unchanged. Right-sided PICC line present with tip at the cavoatrial junction. Lungs are adequately inflated and otherwise clear. Borderline stable cardiomegaly. Remainder of the exam is unchanged. Lungs are  IMPRESSION: No acute cardiopulmonary disease.  Stable borderline cardiomegaly.  Right-sided PICC line in adequate position.   Electronically Signed   By: Marin Olp M.D.   On: 11/24/2014 12:13   Dg Hip Port Unilat With Pelvis 1v Left  11/21/2014   CLINICAL DATA:  Postoperative exam after left total hip replacement  EXAM: LEFT HIP (WITH PELVIS) 1 VIEW PORTABLE  COMPARISON:  None.  FINDINGS: Expected postoperative appearance after left total hip arthroplasty. No evidence for hardware failure. No fracture line visualized. Surgical staples are present. Clips project over the right upper thigh soft tissues.  IMPRESSION: Expected postoperative appearance after left total hip arthroplasty.   Electronically  Signed   By: Conchita Paris M.D.   On: 11/21/2014 18:21   Dg Hip Operative Unilat With Pelvis Left  11/21/2014   CLINICAL DATA:  Total left hip arthroplasty.  EXAM: OPERATIVE LEFT HIP (WITH PELVIS IF PERFORMED) 2 VIEWS  TECHNIQUE: Fluoroscopic spot image(s) were submitted for interpretation post-operatively.  COMPARISON:  None.  FINDINGS: Two intraoperative spot images demonstrate changes of left hip replacement. Normal AP alignment. No hardware or bony complicating feature visualized.  IMPRESSION: Left hip replacement without visible complicating feature on these intraoperative spot images.   Electronically Signed   By: Rolm Baptise M.D.   On: 11/21/2014 16:19   Dg Hips Bilat With Pelvis Min 5 Views  11/19/2014   CLINICAL DATA:  Status post fall, with bilateral hip pain, and bruising about the hips and groin. Initial encounter.  EXAM:  BILATERAL HIP (WITH PELVIS) 5-6 VIEWS  COMPARISON:  None.  FINDINGS: There is a mildly displaced transcervical fracture through the left femoral neck, with superior displacement of the distal femur and mild rotation of the left femoral head. No additional fractures are seen. Both femoral heads remain seated within their respective acetabuli. The right hip joint is grossly unremarkable in appearance.  Mild sclerotic change is seen at the sacroiliac joints. Clips are noted overlying the right inguinal region. Scattered vascular calcifications are seen. The visualized bowel gas pattern is grossly unremarkable.  IMPRESSION: 1. Mildly displaced transcervical fracture through the left femoral neck. 2. Scattered vascular calcifications seen.   Electronically Signed   By: Garald Balding M.D.   On: 11/19/2014 22:54    Medications: Scheduled Meds: . sodium chloride   Intravenous Once  . sodium chloride   Intravenous Once  . aspirin  325 mg Oral BID PC  . carvedilol  3.125 mg Oral BID WC  . escitalopram  10 mg Oral Daily  . ferrous sulfate  325 mg Oral BID WC  . fluconazole  100 mg Oral Daily  . heparin  5,000 Units Subcutaneous 3 times per day  . insulin aspart  0-5 Units Subcutaneous QHS  . insulin aspart  0-9 Units Subcutaneous TID WC  . insulin aspart  3 Units Subcutaneous TID WC  . insulin detemir  10 Units Subcutaneous QHS  . levothyroxine  125 mcg Oral QAC breakfast  . magnesium sulfate 1 - 4 g bolus IVPB  2 g Intravenous Once  . megestrol  400 mg Oral BID  . metoCLOPramide  5 mg Oral TID AC & HS  . pantoprazole sodium  40 mg Oral BID  . polyethylene glycol  17 g Oral Daily  . protein supplement  1 packet Oral BID BM  . rosuvastatin  40 mg Oral Daily  . senna-docusate  2 tablet Oral BID  . sodium chloride  10-40 mL Intracatheter Q12H  . sodium chloride  3 mL Intravenous Q12H  . spironolactone  25 mg Oral Daily     LOS: 8 days    Dajiah Kooi M.D. Ph.D [facility] 11/27/2014, 11:16  AM Pager: [phone]  If 7PM-7AM, please contact night-coverage [email or web address] Password [email or web address]

## 2014-11-27 NOTE — Progress Notes (Signed)
Subjective: Pt stable - wants to get up - cbgs high   Objective: Vital signs in last 24 hours: Temp:  [98.6 F (37 C)-99.1 F (37.3 C)] 99.1 F (37.3 C) (03/20 0505) Pulse Rate:  [71-76] 71 (03/20 0505) Resp:  [17-18] 18 (03/20 0505) BP: (87-103)/(45-62) 103/47 mmHg (03/20 0505) SpO2:  [97 %] 97 % (03/20 0505) Weight:  [65 kg (143 lb 4.8 oz)] 65 kg (143 lb 4.8 oz) (03/20 0500)  Intake/Output from previous day: 03/19 0701 - 03/20 0700 In: 240 [P.O.:240] Out: 2450 [Urine:2450] Intake/Output this shift:    Exam:  Dorsiflexion/Plantar flexion intact  Labs:  Recent Labs  11/25/14 0534 11/26/14 0445 11/27/14 0500  HGB 8.2* 9.2* 9.3*    Recent Labs  11/26/14 0445 11/27/14 0500  WBC 7.7 8.4  RBC 3.06* 3.18*  HCT 26.9* 27.5*  PLT 209 252    Recent Labs  11/26/14 0445 11/27/14 0500  NA 130* 131*  K 4.5 4.2  CL 99 97  CO2 23 24  BUN 15 23  CREATININE 1.36* 1.38*  GLUCOSE 314* 300*  CALCIUM 8.7 8.9   No results for input(s): LABPT, INR in the last 72 hours.  Assessment/Plan: Plan to mobilize with PT - snf this week   Theia Dezeeuw SCOTT 11/27/2014, 10:47 AM

## 2014-11-28 DIAGNOSIS — R627 Adult failure to thrive: Secondary | ICD-10-CM

## 2014-11-28 DIAGNOSIS — F329 Major depressive disorder, single episode, unspecified: Secondary | ICD-10-CM

## 2014-11-28 LAB — CBC
HEMATOCRIT: 26.5 % — AB (ref 36.0–46.0)
HEMOGLOBIN: 9 g/dL — AB (ref 12.0–15.0)
MCH: 29.7 pg (ref 26.0–34.0)
MCHC: 34 g/dL (ref 30.0–36.0)
MCV: 87.5 fL (ref 78.0–100.0)
Platelets: 273 10*3/uL (ref 150–400)
RBC: 3.03 MIL/uL — ABNORMAL LOW (ref 3.87–5.11)
RDW: 14.9 % (ref 11.5–15.5)
WBC: 8.9 10*3/uL (ref 4.0–10.5)

## 2014-11-28 LAB — GLUCOSE, CAPILLARY
GLUCOSE-CAPILLARY: 153 mg/dL — AB (ref 70–99)
Glucose-Capillary: 205 mg/dL — ABNORMAL HIGH (ref 70–99)
Glucose-Capillary: 260 mg/dL — ABNORMAL HIGH (ref 70–99)

## 2014-11-28 LAB — BASIC METABOLIC PANEL
Anion gap: 10 (ref 5–15)
BUN: 20 mg/dL (ref 6–23)
CHLORIDE: 95 mmol/L — AB (ref 96–112)
CO2: 25 mmol/L (ref 19–32)
Calcium: 8.8 mg/dL (ref 8.4–10.5)
Creatinine, Ser: 1.08 mg/dL (ref 0.50–1.10)
GFR calc Af Amer: 62 mL/min — ABNORMAL LOW (ref >90)
GFR calc non Af Amer: 53 mL/min — ABNORMAL LOW (ref >90)
Glucose, Bld: 223 mg/dL — ABNORMAL HIGH (ref 70–99)
Potassium: 3.7 mmol/L (ref 3.5–5.1)
Sodium: 130 mmol/L — ABNORMAL LOW (ref 135–145)

## 2014-11-28 LAB — MAGNESIUM: MAGNESIUM: 2 mg/dL (ref 1.5–2.5)

## 2014-11-28 MED ORDER — INSULIN DETEMIR 100 UNIT/ML ~~LOC~~ SOLN: 15.0000 [IU] | Freq: Every day | SUBCUTANEOUS | Status: DC

## 2014-11-28 MED ORDER — MEGESTROL ACETATE 400 MG/10ML PO SUSP: 400.0000 mg | Freq: Two times a day (BID) | ORAL | Status: DC

## 2014-11-28 MED ORDER — SODIUM CHLORIDE 1 G PO TABS: 1.0000 g | ORAL_TABLET | Freq: Two times a day (BID) | ORAL | Status: DC

## 2014-11-28 MED ORDER — SENNOSIDES-DOCUSATE SODIUM 8.6-50 MG PO TABS: 2.0000 | ORAL_TABLET | Freq: Every day | ORAL | Status: DC

## 2014-11-28 MED ORDER — INSULIN ASPART 100 UNIT/ML ~~LOC~~ SOLN: 0.0000 [IU] | Freq: Every day | SUBCUTANEOUS | Status: DC

## 2014-11-28 MED ORDER — FERROUS SULFATE 325 (65 FE) MG PO TABS: 325.0000 mg | ORAL_TABLET | Freq: Two times a day (BID) | ORAL | Status: DC

## 2014-11-28 MED ORDER — MAGNESIUM OXIDE 400 (241.3 MG) MG PO TABS: 400.0000 mg | ORAL_TABLET | Freq: Every day | ORAL | Status: DC

## 2014-11-28 MED ORDER — SPIRONOLACTONE 25 MG PO TABS: 12.5000 mg | ORAL_TABLET | ORAL | Status: DC

## 2014-11-28 NOTE — Clinical Social Work Note (Signed)
Patient will discharge to [facility] Anticipated discharge date:11/28/14 Family notified: pt to notify husband Transportation by [employer]- scheduled for 4:30pm  CSW signing off.  Domenica Reamer, [place] Social Worker [phone]

## 2014-11-28 NOTE — Progress Notes (Signed)
Hand off report gave to RN, Allyn Kenner at [facility]. Pt is ready for discharge.

## 2014-11-28 NOTE — Progress Notes (Signed)
Physical Therapy Treatment Patient Details Name: Bridget Martinez MRN: [id number] DOB: 09-Jan-1951 Today's Date: 11/28/2014    History of Present Illness Pt is a 64 yo female who sustained a mechanical fall and underwent a L anterior direct THA. pt with h/o of uncontrolled diabetes, depression and heart history.    PT Comments    Patient making slow progress with mobility/gait.  Agree with need for SNF at discharge.  Follow Up Recommendations  SNF;Supervision/Assistance - 24 hour     Equipment Recommendations  None recommended by PT    Recommendations for Other Services       Precautions / Restrictions Precautions Precautions: Fall Precaution Comments: Watch BP Restrictions Weight Bearing Restrictions: Yes LLE Weight Bearing: Weight bearing as tolerated    Mobility  Bed Mobility Overal bed mobility: Needs Assistance Bed Mobility: Supine to Sit     Supine to sit: Max assist;+2 for physical assistance     General bed mobility comments: Verbal cues for technique.  Patient required assist to move LE's off of bed and to raise trunk to sitting position.  Once upright, patient able to maintain balance.  Transfers Overall transfer level: Needs assistance Equipment used: Rolling walker (2 wheeled) Transfers: Sit to/from Stand Sit to Stand: Min assist;+2 physical assistance         General transfer comment: Verbal cues for hand placement and technique.  Cues to bring trunk forward over base of support.  Patient with posterior lean during transfer.  Ambulation/Gait Ambulation/Gait assistance: Mod assist;Min assist;+2 safety/equipment Ambulation Distance (Feet): 4 Feet Assistive device: Rolling walker (2 wheeled) Gait Pattern/deviations: Step-through pattern;Decreased step length - right;Decreased step length - left;Decreased stride length;Decreased weight shift to left;Antalgic;Trunk flexed Gait velocity: Decreased Gait velocity interpretation: Below normal speed for  age/gender General Gait Details: Patient requiring mod assist to maintain balance initially with gait due to posterior lean.  Cues to move RW forward to allow for longer step lengths.  Patient reports feeling dizzy at 4'.  Chair behind patient, and returned to sitting.   Stairs            Wheelchair Mobility    Modified Rankin (Stroke Patients Only)       Balance           Standing balance support: Bilateral upper extremity supported Standing balance-Leahy Scale: Poor Standing balance comment: Patient with posterior lean, requiring physical assist to maintain balance, and cues to shift weight forward.                    Cognition Arousal/Alertness: Awake/alert Behavior During Therapy: Flat affect Overall Cognitive Status: Within Functional Limits for tasks assessed                      Exercises      General Comments        Pertinent Vitals/Pain Pain Assessment: 0-10 Pain Score: 4  (groaning and grimacing with movement) Pain Location: Lt hip Pain Descriptors / Indicators: Aching;Sore Pain Intervention(s): Limited activity within patient's tolerance;Patient requesting pain meds-RN notified;Repositioned    Home Living                      Prior Function            PT Goals (current goals can now be found in the care plan section) Progress towards PT goals: Progressing toward goals (Slow progress)    Frequency  Min 3X/week    PT Plan Current plan remains  appropriate    Co-evaluation             End of Session Equipment Utilized During Treatment: Gait belt Activity Tolerance: Patient limited by fatigue;Patient limited by pain Patient left: in chair;with call bell/phone within reach     Time: 1439-1459 PT Time Calculation (min) (ACUTE ONLY): 20 min  Charges:  $Gait Training: 8-22 mins                    G Codes:      Despina Pole 12-20-14, 5:55 PM Carita Pian. Sanjuana Kava, [place] Pager  [phone]

## 2014-11-28 NOTE — Discharge Summary (Signed)
Discharge Summary  Bridget Martinez [id number] DOB: 09/30/1950  PCP: [name], FNP  Admit date: 11/19/2014 Discharge date: 11/28/2014  Time spent: >60mins  Recommendations for Outpatient Follow-up:  1. F/u with PMD in two weeks, PMD to repeat bmp/mag in two weeks 2. F/u with orthopedics as ordered 3. F/u with cardiology in three weeks, please address plavix use with patient, patient reported that she has not been taking it for several month.  Discharge Diagnoses:  Active Hospital Problems   Diagnosis Date Noted  . Left displaced femoral neck fracture 11/19/2014  . Closed left hip fracture   . Fall   . Anemia 11/02/2014  . Protein-calorie malnutrition, severe 10/27/2014  . Chronic combined systolic and diastolic congestive heart failure 09/19/2014  . Hypothyroidism 09/19/2014  . Hypoglycemia 11/27/2013  . DM (diabetes mellitus), type 1, uncontrolled w/neurologic complication [id number]  . Diabetic gastroparesis 11/13/2013  . CKD (chronic kidney disease), stage III 11/11/2013  . Hypertension   . CAD, multiple vessel 07/27/2011  . Depression 07/25/2011  . Hyperlipidemia     Resolved Hospital Problems   Diagnosis Date Noted Date Resolved  No resolved problems to display.    Discharge Condition: stable  Diet recommendation: heart healthy/carb modified  Filed Weights   11/26/14 0516 11/27/14 0500 11/28/14 0459  Weight: 65.091 kg (143 lb 8 oz) 65 kg (143 lb 4.8 oz) 64.229 kg (141 lb 9.6 oz)    History of present illness:  Bridget Martinez is a 64 y.o. 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  Patient reports that she had fall when she was working in kitchen at home at about 7:30 PM. She did not pass out, no  chest pain or palpitation before the event. It was most likely a mechanical fall per her husband. She had a small amount of bleeding from a small scalp laceration of her head. She developed moderate pain over left hip. She could not stand on left leg without pain. No neck pain. Patient denies fever, chills, headaches, cough, chest pain, SOB, abdominal pain, diarrhea, constipation, dysuria, urgency, frequency, hematuria, skin rashes or leg swelling. No unilateral weakness, numbness or tingling sensations. No vision change or hearing loss.  In ED, patient was found to have mildly displaced transcervical fracture through the left femoral neck by X-ray. CT-head is negative for acute abnormalities. Hypokalemia, negative troponin, stable hemoglobin, stable renal function. Patient is admitted to inpatient for further evaluation and treatment. Orthopedic surgery was consulted by ED.   Hospital Course:  Principal Problem:   Left displaced femoral neck fracture Active Problems:   Hyperlipidemia   Depression   CAD, multiple vessel   Hypertension   CKD (chronic kidney disease), stage III   Diabetic gastroparesis   DM (diabetes mellitus), type 1, uncontrolled w/neurologic complication   Hypoglycemia   Chronic combined systolic and diastolic congestive heart failure   Hypothyroidism   Protein-calorie malnutrition, severe   Anemia   Fall   Closed left hip fracture  Left femoral neck fracture status post mechanical fall - Per patient's family, she had been home only for few days from the skilled nursing facility  - Orthopedic surgery consulted and patient underwent total hip arthroplasty 3/14 - discussed with ortho Dr. Marlou Sa at [id number], he advised to continue hold plavix, advised ortho to address plavix  use at post op follow up appointment. Patient is continued on asa 325 bid per ortho order for postop DVT prophylaxis.   Acute encephalopathy - intermittent confusion 3/15 morning, after pain  medications - obtained MRI brain also given possible focal RLE weakness (non surgical leg), negative - minimize narcotics/muscle relaxants  -improved, aaox3 at discharge  Diabetes mellitus-type I - was recently admitted DKA. Poorly controlled diabetes. Recent A1c was 10.0 on 10/26/14.  - significant difficulty controlling the blood sugars due to gastroparesis. -fluctuating blood sugar due to inconsistency of oral intake.  -need close monitor oral intake, frequent insulin regimen adjustment  Diabetic gastroparesis: - has been chronically on Reglan - monitor QTc  CKD-III:  - Baseline creatinine 1.4-1.6.  - Currently renal function stable  Combined systolic and diastolic Congestive heart failure:  - 2-D echo on 10/29/14 showed EF 30-35% with grade 2 diastolic dysfunction, currently compensated however on IV fluids, avoid fluid overload - Continue aspirin, low dose coreg, decrease  Spironolactone to 12.5mg  three times a week.  - orthostatic vital sign improved at discharge  Coronary artery disease:  - Post status of CABG in 2013. Patient does not have chest pain. - Continue aspirin, Coreg, Crestor - restart Plavix per ortho post op -discussed with family, family reported that patient has not been taking plavix for months, however, per Cardiology note on 11/15/2014 ,patient should be on plavix, instruct patient to follow up with cardiology to discuss plavix use. For now plavix on hold per surgery. Family including daughter expressed understanding.  Hyperlipidemia:  - LDL was at 172 on 09/27/13 - Continue Crestor  Severe protein calorie malnutrition - nutrition supplementation. -start megace to boost appetite  Hypokalemia / Hypomagnesemia - replete as needed  Iron deficiency Anemia:  - iron deficiency, started iron supplement - s/p prbcx2 on 3/13 and 3/14 - prbcx1 on 3/18 for symptomatic anemia -h/h stable at discharge, continue iron supplement  Hypothyroidism:  - TSH  was 2.7 on 07/26/14 - Continued on home regimen of Synthroid.  FTT: very frail,Per chart review patient has been in and out of hospital since 07/2014.   Hyponatremia: due to poor oral intake/diuretic use/elevated blood sugar, salt tabs for few days, decrease diuretic dose and frequency, control blood sugar. Patient dry, no fluids overload.  UA showed yeast, finished diflucan in the hospital, final urine culture no growth.   Code Status: Full code Family Communication: patient /husband  Disposition: skilled nursing facility  Consultants:  Ortho- Dr Ninfa Linden  Procedures: TOTAL [place] (Left) 3/14 prbc transfusion 3/13, 3/14, 3/18  Antibiotics:  Diflucan from 3/17-3/21   Discharge Exam: BP 105/48 mmHg  Pulse 67  Temp(Src) 98.3 F (36.8 C) (Oral)  Resp 18  Ht 5' 3.5" (1.613 m)  Wt 64.229 kg (141 lb 9.6 oz)  BMI 24.69 kg/m2  SpO2 99%  General: Alert and awake, oriented x3, not in any acute distress. Seems stronger, appear older than stated age. HEENT: anicteric sclera, PERLA, EOMI, small scalp laceration CVS: S1-S2 clear, no murmur rubs or gallops,  Chest: no wheezing, no crackles Abdomen: soft nontender, nondistended, normal bowel sounds  Extremities: no cyanosis, clubbing, post op change left hip. No edema Neuro: no acute findings, poor effort, some psychomotor retardation vs generalized weakness/frailty   Discharge Instructions You were cared for by a hospitalist during your hospital stay. If you have any questions about your discharge medications or the care you received while you were in the hospital after you are discharged, you can call  the unit and asked to speak with the hospitalist on call if the hospitalist that took care of you is not available. Once you are discharged, your primary care physician will handle any further medical issues. Please note that NO REFILLS for any discharge medications will be authorized once you are  discharged, as it is imperative that you return to your primary care physician (or establish a relationship with a primary care physician if you do not have one) for your aftercare needs so that they can reassess your need for medications and monitor your lab values.      Discharge Instructions    Diet - low sodium heart healthy    Complete by:  As directed      Full weight bearing    Complete by:  As directed      Increase activity slowly    Complete by:  As directed             Medication List    STOP taking these medications        clopidogrel 75 MG tablet  Commonly known as:  PLAVIX     enoxaparin 30 MG/0.3ML injection  Commonly known as:  LOVENOX      TAKE these medications        aspirin 325 MG EC tablet  Take 1 tablet (325 mg total) by mouth 2 (two) times daily after a meal.     carvedilol 3.125 MG tablet  Commonly known as:  COREG  Take 1 tablet (3.125 mg total) by mouth 2 (two) times daily with a meal.     escitalopram 10 MG tablet  Commonly known as:  LEXAPRO  Take 1 tablet (10 mg total) by mouth daily.     ferrous sulfate 325 (65 FE) MG tablet  Take 1 tablet (325 mg total) by mouth 2 (two) times daily with a meal.     Glycerin (Adult) 2.1 G Supp  Place 1 suppository rectally 2 (two) times daily.     HYDROcodone-acetaminophen 5-325 MG per tablet  Commonly known as:  NORCO  Take 1-2 tablets by mouth every 4 (four) hours as needed for moderate pain.     insulin aspart 100 UNIT/ML injection  Commonly known as:  novoLOG  Inject 8 Units into the skin 3 (three) times daily before meals.     insulin aspart 100 UNIT/ML injection  Commonly known as:  novoLOG  Inject 0-5 Units into the skin at bedtime.     insulin detemir 100 UNIT/ML injection  Commonly known as:  LEVEMIR  Inject 0.15 mLs (15 Units total) into the skin daily.     levothyroxine 125 MCG tablet  Commonly known as:  SYNTHROID, LEVOTHROID  Take 1 tablet (125 mcg total) by mouth daily.      magnesium oxide 400 (241.3 MG) MG tablet  Commonly known as:  MAG-OX  Take 1 tablet (400 mg total) by mouth daily.     megestrol 400 MG/10ML suspension  Commonly known as:  MEGACE  Take 10 mLs (400 mg total) by mouth 2 (two) times daily.     metoCLOPramide 5 MG/5ML solution  Commonly known as:  REGLAN  Take 5 mLs (5 mg total) by mouth 4 (four) times daily -  before meals and at bedtime.     nystatin 100000 UNIT/GM Powd  Apply to groin area 2 times daily.     ondansetron 8 MG disintegrating tablet  Commonly known as:  ZOFRAN ODT  Take 1 tablet (8  mg total) by mouth every 8 (eight) hours as needed for nausea or vomiting.     pantoprazole 40 MG tablet  Commonly known as:  PROTONIX  Take 1 tablet (40 mg total) by mouth daily.     potassium chloride SA 20 MEQ tablet  Commonly known as:  KLOR-CON M20  Take 1 tablet (20 mEq total) by mouth daily.     protein supplement Powd  Commonly known as:  UNJURY CHICKEN SOUP  Take 7 g (2 oz total) by mouth 3 (three) times daily.     rosuvastatin 40 MG tablet  Commonly known as:  CRESTOR  Take 1 tablet (40 mg total) by mouth daily.     senna-docusate 8.6-50 MG per tablet  Commonly known as:  Senokot-S  Take 2 tablets by mouth at bedtime.     sodium chloride 1 G tablet  Take 1 tablet (1 g total) by mouth 2 (two) times daily with a meal.     spironolactone 25 MG tablet  Commonly known as:  ALDACTONE  Take 0.5 tablets (12.5 mg total) by mouth 3 (three) times a week.     Vitamin D (Ergocalciferol) 50000 UNITS Caps capsule  Commonly known as:  DRISDOL  Take 50,000 Units by mouth every Saturday.       Allergies  Allergen Reactions  . Sulfa Antibiotics Anaphylaxis and Swelling    Stiff neck also    Follow-up Information    Follow up with Mcarthur Rossetti, MD. Schedule an appointment as soon as possible for a visit in 2 weeks.   Specialty:  Orthopedic Surgery   Contact information:   [place]  [address] [phone]       Follow up with [name], FNP In 2 weeks.   Specialty:  Nurse Practitioner   Why:  repeat bmp    Contact information:   [place] [address] [phone]       Follow up with Glori Bickers, MD In 3 weeks.   Specialty:  Cardiology   Why:  to discuss plavix use with patient, patient reported has not been on it for months   Contact information:   [address] [place] [address] [phone]        The results of significant diagnostics from this hospitalization (including imaging, microbiology, ancillary and laboratory) are listed below for reference.    Significant Diagnostic Studies: Ct Head Wo Contrast  11/19/2014   CLINICAL DATA:  Head injury after fall. Possible loss of consciousness.  EXAM: CT HEAD WITHOUT CONTRAST  TECHNIQUE: Contiguous axial images were obtained from the base of the skull through the vertex without intravenous contrast.  COMPARISON:  07/26/2014  FINDINGS: No intracranial hemorrhage, mass effect, or midline shift. No hydrocephalus. The basilar cisterns are patent. No evidence of territorial infarct. No intracranial fluid collection. Mild generalized atrophy. Calvarium is intact. Unchanged chronic calcification adjacent to the inner table of left frontal skull. Included paranasal sinuses and mastoid air cells are well aerated.  IMPRESSION: No acute intracranial abnormality.   Electronically Signed   By: Jeb Levering M.D.   On: 11/19/2014 22:49   Mr Brain Wo Contrast  11/22/2014   CLINICAL DATA:  Confusion.  EXAM: MRI HEAD WITHOUT CONTRAST  TECHNIQUE: Multiplanar, multiecho pulse sequences of the brain and surrounding structures were obtained without intravenous contrast.  COMPARISON:  Head CT 11/19/2014  FINDINGS: There is no evidence of acute infarct, intracranial hemorrhage, intra-axial mass, midline shift,  or extra-axial fluid collection. There is mild generalized cerebral  atrophy. Periventricular white matter T2 hyperintensities are nonspecific but compatible with mild chronic small vessel ischemic disease. 1.5 cm focus of chronic ossification along the inner table of the left frontal skull is unchanged; underlying meningioma not excluded.  Orbits are unremarkable. Paranasal sinuses and mastoid air cells are clear. Major intracranial vascular flow voids are preserved.  IMPRESSION: 1. No acute intracranial abnormality. 2. Mild chronic small vessel ischemic disease and cerebral atrophy.   Electronically Signed   By: Logan Bores   On: 11/22/2014 13:00   Dg Chest Port 1 View  11/24/2014   CLINICAL DATA:  F/u for CHF - recent hip surgery for fracture - hx of htn, diabetes, MI with open heart surgery, tuberculosis, GERD - pt says not having any issues today  EXAM: PORTABLE CHEST - 1 VIEW  COMPARISON:  10/26/2014  FINDINGS: Sternotomy wires unchanged. Right-sided PICC line present with tip at the cavoatrial junction. Lungs are adequately inflated and otherwise clear. Borderline stable cardiomegaly. Remainder of the exam is unchanged. Lungs are  IMPRESSION: No acute cardiopulmonary disease.  Stable borderline cardiomegaly.  Right-sided PICC line in adequate position.   Electronically Signed   By: Marin Olp M.D.   On: 11/24/2014 12:13   Dg Hip Port Unilat With Pelvis 1v Left  11/21/2014   CLINICAL DATA:  Postoperative exam after left total hip replacement  EXAM: LEFT HIP (WITH PELVIS) 1 VIEW PORTABLE  COMPARISON:  None.  FINDINGS: Expected postoperative appearance after left total hip arthroplasty. No evidence for hardware failure. No fracture line visualized. Surgical staples are present. Clips project over the right upper thigh soft tissues.  IMPRESSION: Expected postoperative appearance after left total hip arthroplasty.   Electronically Signed   By: Conchita Paris M.D.   On: 11/21/2014 18:21   Dg Hip Operative Unilat With Pelvis Left  11/21/2014   CLINICAL DATA:  Total left  hip arthroplasty.  EXAM: OPERATIVE LEFT HIP (WITH PELVIS IF PERFORMED) 2 VIEWS  TECHNIQUE: Fluoroscopic spot image(s) were submitted for interpretation post-operatively.  COMPARISON:  None.  FINDINGS: Two intraoperative spot images demonstrate changes of left hip replacement. Normal AP alignment. No hardware or bony complicating feature visualized.  IMPRESSION: Left hip replacement without visible complicating feature on these intraoperative spot images.   Electronically Signed   By: Rolm Baptise M.D.   On: 11/21/2014 16:19   Dg Hips Bilat With Pelvis Min 5 Views  11/19/2014   CLINICAL DATA:  Status post fall, with bilateral hip pain, and bruising about the hips and groin. Initial encounter.  EXAM: BILATERAL HIP (WITH PELVIS) 5-6 VIEWS  COMPARISON:  None.  FINDINGS: There is a mildly displaced transcervical fracture through the left femoral neck, with superior displacement of the distal femur and mild rotation of the left femoral head. No additional fractures are seen. Both femoral heads remain seated within their respective acetabuli. The right hip joint is grossly unremarkable in appearance.  Mild sclerotic change is seen at the sacroiliac joints. Clips are noted overlying the right inguinal region. Scattered vascular calcifications are seen. The visualized bowel gas pattern is grossly unremarkable.  IMPRESSION: 1. Mildly displaced transcervical fracture through the left femoral neck. 2. Scattered vascular calcifications seen.   Electronically Signed   By: Garald Balding M.D.   On: 11/19/2014 22:54    Microbiology: Recent Results (from the past 240 hour(s))  Surgical pcr screen     Status: Abnormal   Collection Time: 11/20/14 11:28  AM  Result Value Ref Range Status   MRSA, PCR NEGATIVE NEGATIVE Final   Staphylococcus aureus POSITIVE (A) NEGATIVE Final    Comment:        The Xpert SA Assay (FDA approved for NASAL specimens in patients over 4 years of age), is one component of a comprehensive  surveillance program.  Test performance has been validated by [facility] for patients greater than or equal to 38 year old. It is not intended to diagnose infection nor to guide or monitor treatment. Performed at [facility]   Culture, Urine     Status: None   Collection Time: 11/24/14 12:10 PM  Result Value Ref Range Status   Specimen Description URINE, RANDOM  Final   Special Requests ADDED [id number]  Final   Colony Count   Final    6,000 COLONIES/ML Performed at [facility]    Culture   Final    INSIGNIFICANT GROWTH Performed at [facility]    Report Status 11/26/2014 FINAL  Final     Labs: Basic Metabolic Panel:  Recent Labs Lab 11/24/14 0535 11/24/14 0643 11/25/14 0534 11/26/14 0445 11/27/14 0500 11/28/14 0430  NA 133*  --  135 130* 131* 130*  K 3.3*  --  3.0* 4.5 4.2 3.7  CL 100  --  101 99 97 95*  CO2 24  --  27 23 24 25   GLUCOSE 119*  --  63* 314* 300* 223*  BUN 18  --  15 15 23 20   CREATININE 1.36*  --  1.24* 1.36* 1.38* 1.08  CALCIUM 8.6  --  9.2 8.7 8.9 8.8  MG  --  1.4* 2.1  --  1.7 2.0   Liver Function Tests: No results for input(s): AST, ALT, ALKPHOS, BILITOT, PROT, ALBUMIN in the last 168 hours. No results for input(s): LIPASE, AMYLASE in the last 168 hours. No results for input(s): AMMONIA in the last 168 hours. CBC:  Recent Labs Lab 11/24/14 0535 11/25/14 0534 11/26/14 0445 11/27/14 0500 11/28/14 0430  WBC 7.3 7.2 7.7 8.4 8.9  HGB 8.1* 8.2* 9.2* 9.3* 9.0*  HCT 24.5* 24.5* 26.9* 27.5* 26.5*  MCV 87.2 86.0 87.9 86.5 87.5  PLT 197 215 209 252 273   Cardiac Enzymes: No results for input(s): CKTOTAL, CKMB, CKMBINDEX, TROPONINI in the last 168 hours. BNP: BNP (last 3 results)  Recent Labs  09/19/14 1126 11/15/14 1500 11/20/14 0150  BNP 298.2* 423.4* 321.3*    ProBNP (last 3 results)  Recent Labs  06/15/14 1035 07/26/14 1800  PROBNP 650.1* 2156.0*    CBG:  Recent Labs Lab 11/27/14 0653  11/27/14 1325 11/27/14 1751 11/27/14 2203 11/28/14 0640  GLUCAP 276* 373* 194* 121* 205*       Signed:  Navy Belay MD, PhD  [facility] 11/28/2014, 12:09 PM

## 2014-12-08 ENCOUNTER — Telehealth (HOSPITAL_COMMUNITY): Payer: Self-pay | Admitting: Vascular Surgery

## 2014-12-27 ENCOUNTER — Encounter (HOSPITAL_COMMUNITY): Payer: BC Managed Care – PPO

## 2014-12-28 ENCOUNTER — Ambulatory Visit (HOSPITAL_COMMUNITY)
Admission: RE | Admit: 2014-12-28 | Discharge: 2014-12-28 | Disposition: A | Discharge status: Home / Self Care | Payer: BC Managed Care – PPO | Source: Ambulatory Visit | Attending: Cardiology | Admitting: Cardiology

## 2014-12-28 VITALS — BP 122/50 | HR 83 | Wt 132.5 lb

## 2014-12-28 DIAGNOSIS — I2510 Atherosclerotic heart disease of native coronary artery without angina pectoris: Secondary | ICD-10-CM | POA: Diagnosis not present

## 2014-12-28 DIAGNOSIS — I5022 Chronic systolic (congestive) heart failure: Secondary | ICD-10-CM | POA: Insufficient documentation

## 2014-12-28 LAB — BRAIN NATRIURETIC PEPTIDE: B Natriuretic Peptide: 192 pg/mL — ABNORMAL HIGH (ref 0.0–100.0)

## 2014-12-28 MED ORDER — SPIRONOLACTONE 25 MG PO TABS: 25.0000 mg | ORAL_TABLET | Freq: Every day | ORAL | Status: DC

## 2014-12-28 MED ORDER — ASPIRIN EC 81 MG PO TBEC: 81.0000 mg | DELAYED_RELEASE_TABLET | Freq: Every day | ORAL | Status: DC

## 2014-12-28 NOTE — Progress Notes (Signed)
Patient ID: Bridget Martinez, female   DOB: 1951-07-14, 64 y.o.   MRN: [id number] PCP: Dr Laurance Flatten Endocrinologist: Dr Buddy Duty  HPI: [name] is a 64 yo woman with DM2, HTN, hypothyroidism, CAD s/p CABG x 4 (09/2011), depression and CHF due to ICM.   Admitted to the hospital 08/24/13 for altered mental status and was found to be severely acidotic and was intubated. Glucose on admission >700, temp 91.8 and BP 72/37. Blood cultures one out of two show coagulase-negative staph, urine cultures showed bacillus species  She has had multiple admissions for gastroparesis and hyperglycemia.  In 2/16, she was admitted with DKA, n/v, gastroparesis, and NSTEMI. Evaluated by Dr Burt Knack and not felt to be cath candidate.  She as noted to have intermittent LBBB.    In 4/16, she fell and was re-admitted with hip fracture (mechanical fall), now s/p surgery.  She is in a rehab facility now.  Walking with PT.  Denies dyspnea but not walking much.  No chest pain.  At the facility, she is no longer on aspirin.  It is not clear why.  She is taking spironolactone 12.5 mg tid rather than three times a week.   ECHOs 11/21/11 EF 35-40% 07/30/12  EF  45-50% Severe hypokinesis of inferior posterior wall 09/21/13 EF 35-40% with grade II DD  10/29/2014 EF 30-35% Grade II DD, mild AI, PA systolic pressure 45 mmHg.   Labs 01/2013: K+ 4.0, Creatinine 1.31, BUN 35        08/2013: K+ 4.4, Cr 0.81        11/30/13: K+ 5.4, glucose 541, creatinine 0.91        11/05/2014: K 3.6 Creatinine 1.66           4/16: K 5.9 => 5.1, creatinine 0.9, HCT 30.9   ROS: All systems negative except as listed in HPI, PMH and Problem List.  Past Medical History  Diagnosis Date  . Diabetes mellitus     on insulin, with h/o DKA   . HTN (hypertension)   . Hyperlipidemia   . Venous stasis ulcers   . Arthritis   . Depression     anxiety  . 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 Hypertension   . Coronary  artery disease     angina.  MI.   . Pneumonia        . GERD (gastroesophageal reflux disease)     Hiatal hernia.   . Hypothyroidism   . CHF (congestive heart failure)   . Tuberculosis   . Osteoporosis     Current Outpatient Prescriptions  Medication Sig Dispense Refill  . Amino Acids-Protein Hydrolys (FEEDING SUPPLEMENT, PRO-STAT SUGAR FREE 64,) LIQD Take 30 mLs by mouth daily.    . carvedilol (COREG) 3.125 MG tablet Take 1 tablet (3.125 mg total) by mouth 2 (two) times daily with a meal. 60 tablet 3  . escitalopram (LEXAPRO) 10 MG tablet Take 1 tablet (10 mg total) by mouth daily. 30 tablet 11  . ferrous sulfate 325 (65 FE) MG tablet Take 1 tablet (325 mg total) by mouth 2 (two) times daily with a meal. 30 tablet 3  . Glycerin, Adult, 2.1 G SUPP Place 1 suppository rectally 2 (two) times daily. 60 suppository 0  . HYDROcodone-acetaminophen (NORCO) 5-325 MG per tablet Take 1-2 tablets by mouth every 4 (four) hours as needed for moderate pain. 60 tablet 0  . insulin detemir (LEVEMIR) 100 UNIT/ML injection Inject  30 Units into the skin every morning.    . insulin lispro (HUMALOG) 100 UNIT/ML injection Inject 10 Units into the skin 3 (three) times daily before meals.    Marland Kitchen levothyroxine (SYNTHROID, LEVOTHROID) 125 MCG tablet Take 1 tablet (125 mcg total) by mouth daily. 30 tablet 11  . magnesium oxide (MAG-OX) 400 (241.3 MG) MG tablet Take 1 tablet (400 mg total) by mouth daily. 30 tablet 0  . megestrol (MEGACE) 400 MG/10ML suspension Take 10 mLs (400 mg total) by mouth 2 (two) times daily. 240 mL 0  . metoCLOPramide (REGLAN) 5 MG/5ML solution Take 5 mLs (5 mg total) by mouth 4 (four) times daily -  before meals and at bedtime. 600 mL 0  . Multiple Vitamin (MULTIVITAMIN) tablet Take 1 tablet by mouth daily.    . nitrofurantoin, macrocrystal-monohydrate, (MACROBID) 100 MG capsule Take 100 mg by mouth 2 (two) times daily.    . ondansetron (ZOFRAN ODT) 8 MG disintegrating tablet Take 1 tablet (8  mg total) by mouth every 8 (eight) hours as needed for nausea or vomiting. 20 tablet 1  . pantoprazole (PROTONIX) 40 MG tablet Take 1 tablet (40 mg total) by mouth daily. 30 tablet 3  . rosuvastatin (CRESTOR) 40 MG tablet Take 1 tablet (40 mg total) by mouth daily. 30 tablet 11  . senna-docusate (SENOKOT-S) 8.6-50 MG per tablet Take 2 tablets by mouth at bedtime. 30 tablet 0  . sodium chloride 1 G tablet Take 1 tablet (1 g total) by mouth 2 (two) times daily with a meal. 6 tablet 0  . spironolactone (ALDACTONE) 25 MG tablet Take 1 tablet (25 mg total) by mouth daily.    . vitamin C (ASCORBIC ACID) 500 MG tablet Take 500 mg by mouth 2 (two) times daily.    . Vitamin D, Ergocalciferol, (DRISDOL) 50000 UNITS CAPS capsule Take 50,000 Units by mouth every Saturday.     . zinc sulfate 220 MG capsule Take 220 mg by mouth daily.    Marland Kitchen aspirin EC 81 MG tablet Take 1 tablet (81 mg total) by mouth daily. 90 tablet 3   No current facility-administered medications for this encounter.    Filed Vitals:   12/28/14 1000  BP: 122/50  Pulse: 83  Weight: 132 lb 8 oz (60.102 kg)  SpO2: 100%    PHYSICAL EXAM:  General:  NAD HEENT: normal Neck: supple.  No JVD.  Carotids 2+ bilaterally; no bruits. No lymphadenopathy or thryomegaly appreciated. Cor: PMI normal. Regular rate & rhythm. No rubs, gallops or murmurs. No edema.  Lungs: clear Abdomen: soft, nontender, nondistended. No hepatosplenomegaly. No bruits or masses. Good bowel sounds. Extremities: no cyanosis, clubbing, rash.  Neuro: alert & orientedx3, cranial nerves grossly intact. Moves all 4 extremities w/o difficulty. Affect pleasant.  ASSESSMENT & PLAN:   1) Chronic systolic HF: Ischemic cardiomyopathy.  Echo (2/16) with EF 30-35% RV normal.  NYHA II symptoms and volume status stable. Has been off lasix for several months.  - Continue current Coreg.   - Change spironolactone to 25 mg daily and will check BMET today given recent hyperkalemia. -  Creatinine improved, would consider going on ACEI at next appt.   - Check BNP today.  - Reinforced the need and importance of daily weights, a low sodium diet, and fluid restriction (less than 2 L a day). Instructed to call the HF clinic if weight increases more than 3 lbs overnight or 5 lbs in a week.  2) HTN: Stable today. Continue current  regimen.   3) HLD: Continue statin.  4) CAD: s/p CABG x 4. No chest pain.  Restart ASA 81 daily and continue statin.   5) DM2: Has had multiple admits with hyperglycemia and hypoglycemia. At SNF currently. She is followed by Dr Buddy Duty.   6) CKD: Check BMET today, most recent creatinine was improved.   Loralie Champagne 12/28/2014

## 2014-12-28 NOTE — Patient Instructions (Signed)
Start Aspirin 81 mg daily  Change Spironolactone to 25 mg daily  Your physician recommends that you schedule a follow-up appointment in: 6 weeks

## 2015-01-27 ENCOUNTER — Other Ambulatory Visit: Payer: Self-pay | Admitting: Family Medicine

## 2015-01-27 MED ORDER — NITROFURANTOIN MONOHYD MACRO 100 MG PO CAPS: 100.0000 mg | ORAL_CAPSULE | Freq: Two times a day (BID) | ORAL | Status: DC

## 2015-01-27 NOTE — Progress Notes (Signed)
Patient at home having just had back surgery. Urinalysis and culture shows infection. Begin Macrobid 1 tablet twice a day #14

## 2015-02-01 ENCOUNTER — Telehealth: Payer: Self-pay | Admitting: *Deleted

## 2015-02-01 NOTE — Telephone Encounter (Signed)
Stop medication immediately

## 2015-02-01 NOTE — Telephone Encounter (Signed)
Pt aware of stopping medication and watching. If sxs to not improve to be seen again next week.

## 2015-02-01 NOTE — Telephone Encounter (Signed)
Patient was given macrobid for UTI and since starting the medication her hands have started peeling and her skin is coming off. Please advise Dr. Sabra Heck is off today.

## 2015-02-03 ENCOUNTER — Encounter (HOSPITAL_COMMUNITY): Payer: Self-pay | Admitting: *Deleted

## 2015-02-03 ENCOUNTER — Inpatient Hospital Stay (HOSPITAL_COMMUNITY)
Admission: EM | Admit: 2015-02-03 | Discharge: 2015-02-09 | DRG: 603 | Disposition: A | Discharge status: Home Health | Payer: BC Managed Care – PPO | Attending: Internal Medicine | Admitting: Internal Medicine

## 2015-02-03 DIAGNOSIS — I129 Hypertensive chronic kidney disease with stage 1 through stage 4 chronic kidney disease, or unspecified chronic kidney disease: Secondary | ICD-10-CM | POA: Diagnosis present

## 2015-02-03 DIAGNOSIS — L03317 Cellulitis of buttock: Secondary | ICD-10-CM | POA: Diagnosis present

## 2015-02-03 DIAGNOSIS — E1122 Type 2 diabetes mellitus with diabetic chronic kidney disease: Secondary | ICD-10-CM | POA: Diagnosis present

## 2015-02-03 DIAGNOSIS — B372 Candidiasis of skin and nail: Secondary | ICD-10-CM | POA: Diagnosis not present

## 2015-02-03 DIAGNOSIS — Z8249 Family history of ischemic heart disease and other diseases of the circulatory system: Secondary | ICD-10-CM

## 2015-02-03 DIAGNOSIS — R739 Hyperglycemia, unspecified: Secondary | ICD-10-CM

## 2015-02-03 DIAGNOSIS — L8990 Pressure ulcer of unspecified site, unspecified stage: Secondary | ICD-10-CM | POA: Diagnosis not present

## 2015-02-03 DIAGNOSIS — E1159 Type 2 diabetes mellitus with other circulatory complications: Secondary | ICD-10-CM | POA: Diagnosis present

## 2015-02-03 DIAGNOSIS — N3000 Acute cystitis without hematuria: Secondary | ICD-10-CM | POA: Diagnosis not present

## 2015-02-03 DIAGNOSIS — E1165 Type 2 diabetes mellitus with hyperglycemia: Secondary | ICD-10-CM | POA: Diagnosis present

## 2015-02-03 DIAGNOSIS — N179 Acute kidney failure, unspecified: Secondary | ICD-10-CM | POA: Diagnosis present

## 2015-02-03 DIAGNOSIS — L271 Localized skin eruption due to drugs and medicaments taken internally: Secondary | ICD-10-CM | POA: Diagnosis present

## 2015-02-03 DIAGNOSIS — Z951 Presence of aortocoronary bypass graft: Secondary | ICD-10-CM

## 2015-02-03 DIAGNOSIS — E1049 Type 1 diabetes mellitus with other diabetic neurological complication: Secondary | ICD-10-CM | POA: Diagnosis present

## 2015-02-03 DIAGNOSIS — Z794 Long term (current) use of insulin: Secondary | ICD-10-CM | POA: Diagnosis not present

## 2015-02-03 DIAGNOSIS — Z96642 Presence of left artificial hip joint: Secondary | ICD-10-CM | POA: Diagnosis present

## 2015-02-03 DIAGNOSIS — B9562 Methicillin resistant Staphylococcus aureus infection as the cause of diseases classified elsewhere: Secondary | ICD-10-CM | POA: Diagnosis present

## 2015-02-03 DIAGNOSIS — E1129 Type 2 diabetes mellitus with other diabetic kidney complication: Secondary | ICD-10-CM | POA: Diagnosis not present

## 2015-02-03 DIAGNOSIS — L89319 Pressure ulcer of right buttock, unspecified stage: Secondary | ICD-10-CM | POA: Diagnosis present

## 2015-02-03 DIAGNOSIS — E039 Hypothyroidism, unspecified: Secondary | ICD-10-CM | POA: Diagnosis present

## 2015-02-03 DIAGNOSIS — I5042 Chronic combined systolic (congestive) and diastolic (congestive) heart failure: Secondary | ICD-10-CM | POA: Diagnosis present

## 2015-02-03 DIAGNOSIS — IMO0002 Reserved for concepts with insufficient information to code with codable children: Secondary | ICD-10-CM | POA: Diagnosis present

## 2015-02-03 DIAGNOSIS — L2389 Allergic contact dermatitis due to other agents: Secondary | ICD-10-CM | POA: Diagnosis present

## 2015-02-03 DIAGNOSIS — T378X5A Adverse effect of other specified systemic anti-infectives and antiparasitics, initial encounter: Secondary | ICD-10-CM | POA: Diagnosis present

## 2015-02-03 DIAGNOSIS — N183 Chronic kidney disease, stage 3 (moderate): Secondary | ICD-10-CM | POA: Diagnosis present

## 2015-02-03 DIAGNOSIS — L0231 Cutaneous abscess of buttock: Principal | ICD-10-CM | POA: Insufficient documentation

## 2015-02-03 DIAGNOSIS — I2510 Atherosclerotic heart disease of native coronary artery without angina pectoris: Secondary | ICD-10-CM | POA: Diagnosis present

## 2015-02-03 DIAGNOSIS — E876 Hypokalemia: Secondary | ICD-10-CM | POA: Diagnosis present

## 2015-02-03 DIAGNOSIS — L239 Allergic contact dermatitis, unspecified cause: Secondary | ICD-10-CM | POA: Insufficient documentation

## 2015-02-03 DIAGNOSIS — F418 Other specified anxiety disorders: Secondary | ICD-10-CM | POA: Diagnosis present

## 2015-02-03 DIAGNOSIS — M810 Age-related osteoporosis without current pathological fracture: Secondary | ICD-10-CM | POA: Diagnosis present

## 2015-02-03 DIAGNOSIS — I252 Old myocardial infarction: Secondary | ICD-10-CM

## 2015-02-03 DIAGNOSIS — Z833 Family history of diabetes mellitus: Secondary | ICD-10-CM

## 2015-02-03 DIAGNOSIS — Z882 Allergy status to sulfonamides status: Secondary | ICD-10-CM

## 2015-02-03 DIAGNOSIS — K219 Gastro-esophageal reflux disease without esophagitis: Secondary | ICD-10-CM | POA: Diagnosis present

## 2015-02-03 DIAGNOSIS — M1990 Unspecified osteoarthritis, unspecified site: Secondary | ICD-10-CM | POA: Diagnosis present

## 2015-02-03 DIAGNOSIS — Z79899 Other long term (current) drug therapy: Secondary | ICD-10-CM

## 2015-02-03 DIAGNOSIS — Z881 Allergy status to other antibiotic agents status: Secondary | ICD-10-CM

## 2015-02-03 DIAGNOSIS — I272 Other secondary pulmonary hypertension: Secondary | ICD-10-CM | POA: Diagnosis present

## 2015-02-03 DIAGNOSIS — D631 Anemia in chronic kidney disease: Secondary | ICD-10-CM | POA: Diagnosis present

## 2015-02-03 DIAGNOSIS — I10 Essential (primary) hypertension: Secondary | ICD-10-CM

## 2015-02-03 DIAGNOSIS — E785 Hyperlipidemia, unspecified: Secondary | ICD-10-CM | POA: Diagnosis present

## 2015-02-03 DIAGNOSIS — E1149 Type 2 diabetes mellitus with other diabetic neurological complication: Secondary | ICD-10-CM | POA: Diagnosis present

## 2015-02-03 DIAGNOSIS — N390 Urinary tract infection, site not specified: Secondary | ICD-10-CM | POA: Diagnosis present

## 2015-02-03 DIAGNOSIS — Z8611 Personal history of tuberculosis: Secondary | ICD-10-CM | POA: Diagnosis not present

## 2015-02-03 DIAGNOSIS — E860 Dehydration: Secondary | ICD-10-CM

## 2015-02-03 DIAGNOSIS — B9620 Unspecified Escherichia coli [E. coli] as the cause of diseases classified elsewhere: Secondary | ICD-10-CM | POA: Diagnosis present

## 2015-02-03 DIAGNOSIS — Z7982 Long term (current) use of aspirin: Secondary | ICD-10-CM | POA: Diagnosis not present

## 2015-02-03 DIAGNOSIS — E871 Hypo-osmolality and hyponatremia: Secondary | ICD-10-CM | POA: Diagnosis present

## 2015-02-03 DIAGNOSIS — E1041 Type 1 diabetes mellitus with diabetic mononeuropathy: Secondary | ICD-10-CM | POA: Diagnosis not present

## 2015-02-03 DIAGNOSIS — E1065 Type 1 diabetes mellitus with hyperglycemia: Secondary | ICD-10-CM

## 2015-02-03 DIAGNOSIS — R531 Weakness: Secondary | ICD-10-CM

## 2015-02-03 DIAGNOSIS — I152 Hypertension secondary to endocrine disorders: Secondary | ICD-10-CM | POA: Diagnosis present

## 2015-02-03 DIAGNOSIS — N184 Chronic kidney disease, stage 4 (severe): Secondary | ICD-10-CM | POA: Diagnosis present

## 2015-02-03 LAB — COMPREHENSIVE METABOLIC PANEL
ALK PHOS: 149 U/L — AB (ref 38–126)
ALT: 13 U/L — ABNORMAL LOW (ref 14–54)
AST: 12 U/L — AB (ref 15–41)
Albumin: 3.5 g/dL (ref 3.5–5.0)
Anion gap: 10 (ref 5–15)
BILIRUBIN TOTAL: 0.7 mg/dL (ref 0.3–1.2)
BUN: 33 mg/dL — AB (ref 6–20)
CHLORIDE: 104 mmol/L (ref 101–111)
CO2: 20 mmol/L — ABNORMAL LOW (ref 22–32)
Calcium: 9.9 mg/dL (ref 8.9–10.3)
Creatinine, Ser: 1.92 mg/dL — ABNORMAL HIGH (ref 0.44–1.00)
GFR calc non Af Amer: 26 mL/min — ABNORMAL LOW (ref >60)
GFR, EST AFRICAN AMERICAN: 31 mL/min — AB (ref >60)
GLUCOSE: 279 mg/dL — AB (ref 65–99)
Potassium: 3.4 mmol/L — ABNORMAL LOW (ref 3.5–5.1)
SODIUM: 134 mmol/L — AB (ref 135–145)
Total Protein: 7.3 g/dL (ref 6.5–8.1)

## 2015-02-03 LAB — CBC WITH DIFFERENTIAL/PLATELET
Basophils Absolute: 0 10*3/uL (ref 0.0–0.1)
Basophils Relative: 0 % (ref 0–1)
Eosinophils Absolute: 0.1 10*3/uL (ref 0.0–0.7)
Eosinophils Relative: 1 % (ref 0–5)
HCT: 29.7 % — ABNORMAL LOW (ref 36.0–46.0)
Hemoglobin: 10.2 g/dL — ABNORMAL LOW (ref 12.0–15.0)
Lymphocytes Relative: 11 % — ABNORMAL LOW (ref 12–46)
Lymphs Abs: 1.2 10*3/uL (ref 0.7–4.0)
MCH: 29.2 pg (ref 26.0–34.0)
MCHC: 34.3 g/dL (ref 30.0–36.0)
MCV: 85.1 fL (ref 78.0–100.0)
Monocytes Absolute: 1.2 10*3/uL — ABNORMAL HIGH (ref 0.1–1.0)
Monocytes Relative: 12 % (ref 3–12)
NEUTROS ABS: 8.2 10*3/uL — AB (ref 1.7–7.7)
Neutrophils Relative %: 76 % (ref 43–77)
Platelets: 464 10*3/uL — ABNORMAL HIGH (ref 150–400)
RBC: 3.49 MIL/uL — ABNORMAL LOW (ref 3.87–5.11)
RDW: 13.8 % (ref 11.5–15.5)
WBC: 10.7 10*3/uL — ABNORMAL HIGH (ref 4.0–10.5)

## 2015-02-03 LAB — I-STAT CG4 LACTIC ACID, ED
Lactic Acid, Venous: 1.7 mmol/L (ref 0.5–2.0)
Lactic Acid, Venous: 1.99 mmol/L (ref 0.5–2.0)

## 2015-02-03 LAB — CBG MONITORING, ED: Glucose-Capillary: 247 mg/dL — ABNORMAL HIGH (ref 65–99)

## 2015-02-03 MED ORDER — SODIUM CHLORIDE 0.9 % IV BOLUS (SEPSIS)
1000.0000 mL | Freq: Once | INTRAVENOUS | Status: AC
  Administered 2015-02-03: 1000 mL via INTRAVENOUS

## 2015-02-03 MED ORDER — ROSUVASTATIN CALCIUM 40 MG PO TABS
40.0000 mg | ORAL_TABLET | Freq: Every day | ORAL | Status: DC
  Administered 2015-02-04 – 2015-02-09 (×6): 40 mg via ORAL
  Filled 2015-02-03 (×6): qty 1

## 2015-02-03 MED ORDER — INSULIN ASPART 100 UNIT/ML ~~LOC~~ SOLN
0.0000 [IU] | Freq: Three times a day (TID) | SUBCUTANEOUS | Status: DC
  Administered 2015-02-04: 7 [IU] via SUBCUTANEOUS
  Administered 2015-02-04: 3 [IU] via SUBCUTANEOUS
  Administered 2015-02-04: 10 [IU] via SUBCUTANEOUS
  Administered 2015-02-05 (×2): 3 [IU] via SUBCUTANEOUS
  Administered 2015-02-05: 7 [IU] via SUBCUTANEOUS
  Administered 2015-02-06: 2 [IU] via SUBCUTANEOUS
  Administered 2015-02-06: 1 [IU] via SUBCUTANEOUS
  Administered 2015-02-06: 3 [IU] via SUBCUTANEOUS
  Administered 2015-02-07: 9 [IU] via SUBCUTANEOUS

## 2015-02-03 MED ORDER — VANCOMYCIN HCL IN DEXTROSE 1-5 GM/200ML-% IV SOLN
1000.0000 mg | Freq: Once | INTRAVENOUS | Status: AC
  Administered 2015-02-03: 1000 mg via INTRAVENOUS
  Filled 2015-02-03: qty 200

## 2015-02-03 MED ORDER — METOCLOPRAMIDE HCL 5 MG/5ML PO SOLN
5.0000 mg | Freq: Three times a day (TID) | ORAL | Status: DC
  Administered 2015-02-03 – 2015-02-09 (×18): 5 mg via ORAL
  Filled 2015-02-03 (×29): qty 5

## 2015-02-03 MED ORDER — CARVEDILOL 3.125 MG PO TABS
3.1250 mg | ORAL_TABLET | Freq: Two times a day (BID) | ORAL | Status: DC
  Administered 2015-02-04 – 2015-02-09 (×9): 3.125 mg via ORAL
  Filled 2015-02-03 (×9): qty 1

## 2015-02-03 MED ORDER — ESCITALOPRAM OXALATE 10 MG PO TABS
10.0000 mg | ORAL_TABLET | Freq: Every day | ORAL | Status: DC
Start: 2015-02-04 — End: 2015-02-09
  Administered 2015-02-04 – 2015-02-09 (×6): 10 mg via ORAL
  Filled 2015-02-03 (×6): qty 1

## 2015-02-03 MED ORDER — SPIRONOLACTONE 25 MG PO TABS
25.0000 mg | ORAL_TABLET | Freq: Every day | ORAL | Status: DC
  Administered 2015-02-06 – 2015-02-09 (×4): 25 mg via ORAL
  Filled 2015-02-03 (×4): qty 1

## 2015-02-03 MED ORDER — DIPHENHYDRAMINE HCL 25 MG PO CAPS: 25.0000 mg | ORAL_CAPSULE | Freq: Four times a day (QID) | ORAL | Status: DC | PRN

## 2015-02-03 MED ORDER — VITAMIN C 500 MG PO TABS
500.0000 mg | ORAL_TABLET | Freq: Two times a day (BID) | ORAL | Status: DC
  Administered 2015-02-03 – 2015-02-09 (×12): 500 mg via ORAL
  Filled 2015-02-03 (×12): qty 1

## 2015-02-03 MED ORDER — VITAMIN D (ERGOCALCIFEROL) 1.25 MG (50000 UNIT) PO CAPS
50000.0000 [IU] | ORAL_CAPSULE | ORAL | Status: DC
  Filled 2015-02-03 (×2): qty 1

## 2015-02-03 MED ORDER — VANCOMYCIN HCL IN DEXTROSE 1-5 GM/200ML-% IV SOLN: 1000.0000 mg | Freq: Once | INTRAVENOUS | Status: DC

## 2015-02-03 MED ORDER — MEGESTROL ACETATE 400 MG/10ML PO SUSP
400.0000 mg | Freq: Two times a day (BID) | ORAL | Status: DC
  Administered 2015-02-03 – 2015-02-09 (×11): 400 mg via ORAL
  Filled 2015-02-03 (×15): qty 10

## 2015-02-03 MED ORDER — SODIUM CHLORIDE 0.9 % IV SOLN: INTRAVENOUS | Status: DC

## 2015-02-03 MED ORDER — LIDOCAINE-EPINEPHRINE (PF) 2 %-1:200000 IJ SOLN
20.0000 mL | Freq: Once | INTRAMUSCULAR | Status: AC
  Administered 2015-02-03: 20 mL
  Filled 2015-02-03: qty 20

## 2015-02-03 MED ORDER — INSULIN ASPART 100 UNIT/ML ~~LOC~~ SOLN: 8.0000 [IU] | Freq: Once | SUBCUTANEOUS | Status: DC

## 2015-02-03 MED ORDER — HYDROMORPHONE HCL 1 MG/ML IJ SOLN: 1.0000 mg | INTRAMUSCULAR | Status: DC | PRN

## 2015-02-03 MED ORDER — ENOXAPARIN SODIUM 40 MG/0.4ML ~~LOC~~ SOLN
40.0000 mg | SUBCUTANEOUS | Status: DC
  Administered 2015-02-03: 40 mg via SUBCUTANEOUS
  Filled 2015-02-03: qty 0.4

## 2015-02-03 MED ORDER — MAGNESIUM OXIDE 400 (241.3 MG) MG PO TABS
400.0000 mg | ORAL_TABLET | Freq: Every day | ORAL | Status: DC
  Administered 2015-02-04 – 2015-02-09 (×6): 400 mg via ORAL
  Filled 2015-02-03 (×6): qty 1

## 2015-02-03 MED ORDER — PANTOPRAZOLE SODIUM 40 MG PO TBEC
40.0000 mg | DELAYED_RELEASE_TABLET | Freq: Every day | ORAL | Status: DC
  Administered 2015-02-04 – 2015-02-09 (×6): 40 mg via ORAL
  Filled 2015-02-03 (×6): qty 1

## 2015-02-03 MED ORDER — VANCOMYCIN HCL IN DEXTROSE 750-5 MG/150ML-% IV SOLN
750.0000 mg | INTRAVENOUS | Status: DC
  Administered 2015-02-04: 750 mg via INTRAVENOUS
  Filled 2015-02-03 (×2): qty 150

## 2015-02-03 MED ORDER — FERROUS SULFATE 325 (65 FE) MG PO TABS
325.0000 mg | ORAL_TABLET | Freq: Two times a day (BID) | ORAL | Status: DC
  Administered 2015-02-04 – 2015-02-09 (×10): 325 mg via ORAL
  Filled 2015-02-03 (×10): qty 1

## 2015-02-03 MED ORDER — ASPIRIN EC 81 MG PO TBEC
81.0000 mg | DELAYED_RELEASE_TABLET | Freq: Every day | ORAL | Status: DC
  Administered 2015-02-03 – 2015-02-09 (×7): 81 mg via ORAL
  Filled 2015-02-03 (×6): qty 1

## 2015-02-03 MED ORDER — PIPERACILLIN-TAZOBACTAM 3.375 G IVPB 30 MIN
3.3750 g | Freq: Once | INTRAVENOUS | Status: AC
  Administered 2015-02-03: 3.375 g via INTRAVENOUS
  Filled 2015-02-03: qty 50

## 2015-02-03 MED ORDER — SENNOSIDES-DOCUSATE SODIUM 8.6-50 MG PO TABS
2.0000 | ORAL_TABLET | Freq: Every day | ORAL | Status: DC
  Administered 2015-02-03 – 2015-02-07 (×4): 2 via ORAL
  Filled 2015-02-03 (×5): qty 2

## 2015-02-03 MED ORDER — POTASSIUM CHLORIDE CRYS ER 20 MEQ PO TBCR
40.0000 meq | EXTENDED_RELEASE_TABLET | Freq: Once | ORAL | Status: AC
  Administered 2015-02-03: 40 meq via ORAL
  Filled 2015-02-03: qty 2

## 2015-02-03 MED ORDER — POTASSIUM CHLORIDE CRYS ER 20 MEQ PO TBCR
40.0000 meq | EXTENDED_RELEASE_TABLET | Freq: Once | ORAL | Status: AC
  Administered 2015-02-03: 40 meq via ORAL

## 2015-02-03 MED ORDER — LEVOTHYROXINE SODIUM 25 MCG PO TABS
125.0000 ug | ORAL_TABLET | Freq: Every day | ORAL | Status: DC
  Administered 2015-02-04 – 2015-02-09 (×6): 125 ug via ORAL
  Filled 2015-02-03 (×14): qty 1

## 2015-02-03 MED ORDER — ZINC SULFATE 220 (50 ZN) MG PO CAPS
220.0000 mg | ORAL_CAPSULE | Freq: Every day | ORAL | Status: DC
  Administered 2015-02-05 – 2015-02-09 (×5): 220 mg via ORAL
  Filled 2015-02-03 (×7): qty 1

## 2015-02-03 MED ORDER — SODIUM CHLORIDE 1 G PO TABS
1.0000 g | ORAL_TABLET | Freq: Two times a day (BID) | ORAL | Status: DC
  Administered 2015-02-04 – 2015-02-09 (×10): 1 g via ORAL
  Filled 2015-02-03 (×13): qty 1

## 2015-02-03 MED ORDER — SODIUM CHLORIDE 0.9 % IV SOLN
INTRAVENOUS | Status: DC
  Administered 2015-02-03: 22:00:00 via INTRAVENOUS

## 2015-02-03 MED ORDER — GLYCERIN (LAXATIVE) 2.1 G RE SUPP
1.0000 | Freq: Two times a day (BID) | RECTAL | Status: DC
  Administered 2015-02-03 – 2015-02-09 (×5): 1 via RECTAL
  Filled 2015-02-03 (×14): qty 1

## 2015-02-03 MED ORDER — PIPERACILLIN-TAZOBACTAM 3.375 G IVPB
3.3750 g | Freq: Three times a day (TID) | INTRAVENOUS | Status: DC
  Administered 2015-02-04 – 2015-02-08 (×13): 3.375 g via INTRAVENOUS
  Filled 2015-02-03 (×15): qty 50

## 2015-02-03 MED ORDER — ONDANSETRON 4 MG PO TBDP: 8.0000 mg | ORAL_TABLET | Freq: Three times a day (TID) | ORAL | Status: DC | PRN

## 2015-02-03 MED ORDER — HYDROCODONE-ACETAMINOPHEN 5-325 MG PO TABS
1.0000 | ORAL_TABLET | ORAL | Status: DC | PRN
  Administered 2015-02-06: 1 via ORAL
  Filled 2015-02-03: qty 1

## 2015-02-03 MED ORDER — PRO-STAT SUGAR FREE PO LIQD
30.0000 mL | Freq: Every day | ORAL | Status: DC
  Administered 2015-02-04 – 2015-02-09 (×6): 30 mL via ORAL
  Filled 2015-02-03 (×12): qty 30

## 2015-02-03 MED ORDER — INSULIN DETEMIR 100 UNIT/ML ~~LOC~~ SOLN
30.0000 [IU] | Freq: Every morning | SUBCUTANEOUS | Status: DC
Start: 2015-02-04 — End: 2015-02-07
  Administered 2015-02-04 – 2015-02-07 (×4): 30 [IU] via SUBCUTANEOUS
  Filled 2015-02-03 (×4): qty 0.3

## 2015-02-03 NOTE — ED Provider Notes (Signed)
CSN: [id number]     Arrival date & time 02/03/15  75 History   First MD Initiated Contact with Patient 02/03/15 1708     Chief Complaint  Patient presents with  . Pressure Ulcer     (Consider location/radiation/quality/duration/timing/severity/associated sxs/prior Treatment) The history is provided by the patient.  Patient with hx dm, presents w generalized weakness in the past week. No unilateral numbness/weakness. No change in speech or vision. Was dx w uti last week and txd with macrobid - subsequently developed, scaly, peeling, rash to palms of both hands.  Is currently off abx, and bil hand rash sl improved, mildly pruritic.  Pt went to urgent care with skin rash and 'buttock ulcer' today, and was referred to ED.  States right buttock 'ulcer' present for 3-4 days. No hx same. Area is erythematous and draining purulent discharge currently. No fever or chills. Relatively poor po intake.      Past Medical History  Diagnosis Date  . Diabetes mellitus     on insulin, with h/o DKA   . HTN (hypertension)   . Hyperlipidemia   . Venous stasis ulcers   . Arthritis   . Depression     anxiety  . 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 Hypertension   . Coronary artery disease     angina.  MI.   . Pneumonia        . GERD (gastroesophageal reflux disease)     Hiatal hernia.   . Hypothyroidism   . CHF (congestive heart failure)   . Tuberculosis   . Osteoporosis    Past Surgical History  Procedure Laterality Date  . Cardiac catheterization    . Tonsillectomy    . Coronary artery bypass graft      Dr. Prescott Gum in 09/2011   . Coronary artery bypass graft  2012  . Esophagogastroduodenoscopy N/A 11/17/2013    Procedure: ESOPHAGOGASTRODUODENOSCOPY (EGD);  Surgeon: Lafayette Dragon, MD;  Location: [facility] ENDOSCOPY;  Service: Endoscopy;  Laterality: N/A;  . Colonoscopy N/A 11/18/2013    Procedure: COLONOSCOPY;  Surgeon: Lafayette Dragon, MD;  Location:  [facility] ENDOSCOPY;  Service: Endoscopy;  Laterality: N/A;  . Right heart catheterization N/A 07/29/2011    Procedure: RIGHT HEART CATH;  Surgeon: Minus Breeding, MD;  Location: [facility] CATH LAB;  Service: Cardiovascular;  Laterality: N/A;  . Left heart catheterization with coronary angiogram N/A 07/26/2011    Procedure: LEFT HEART CATHETERIZATION WITH CORONARY ANGIOGRAM;  Surgeon: Larey Dresser, MD;  Location: [facility] CATH LAB;  Service: Cardiovascular;  Laterality: N/A;  . Total hip arthroplasty Left 11/21/2014    Procedure: TOTAL HIP ARTHROPLASTY ANTERIOR APPROACH;  Surgeon: Mcarthur Rossetti, MD;  Location: [place];  Service: Orthopedics;  Laterality: Left;   Family History  Problem Relation Age of Onset  . Heart disease Father   . Multiple sclerosis Father   . Hypertension Mother   . Hyperthyroidism Mother   . Diabetes Cousin     Multiple maternal cousins with type 2 diabetes mellitus  . Diabetes Maternal Uncle     Type 1 diabetes mellitus  . Heart attack Paternal Grandfather   . Heart disease Paternal Grandfather    History  Substance Use Topics  . Smoking status: Never Smoker   . Smokeless tobacco: Not on file  . Alcohol Use: No   OB History    No data available     Review of Systems  Constitutional: Negative  for fever and chills.  HENT: Negative for sore throat.   Eyes: Negative for redness.  Respiratory: Negative for cough and shortness of breath.   Cardiovascular: Negative for chest pain.  Gastrointestinal: Negative for vomiting, abdominal pain and diarrhea.  Genitourinary: Negative for dysuria and flank pain.  Musculoskeletal: Negative for back pain and neck pain.  Skin: Positive for rash.  Neurological: Negative for headaches.  Hematological: Does not bruise/bleed easily.  Psychiatric/Behavioral: Negative for confusion.      Allergies  Sulfa antibiotics  Home Medications   Prior to Admission medications   Medication Sig Start Date End Date Taking? Authorizing  Provider  Amino Acids-Protein Hydrolys (FEEDING SUPPLEMENT, PRO-STAT SUGAR FREE 64,) LIQD Take 30 mLs by mouth daily.    Historical Provider, MD  aspirin EC 81 MG tablet Take 1 tablet (81 mg total) by mouth daily. 12/28/14   Larey Dresser, MD  carvedilol (COREG) 3.125 MG tablet Take 1 tablet (3.125 mg total) by mouth 2 (two) times daily with a meal. 11/18/14   Larey Dresser, MD  escitalopram (LEXAPRO) 10 MG tablet Take 1 tablet (10 mg total) by mouth daily. 09/27/13   Lysbeth Penner, FNP  ferrous sulfate 325 (65 FE) MG tablet Take 1 tablet (325 mg total) by mouth 2 (two) times daily with a meal. 11/28/14   Florencia Reasons, MD  Glycerin, Adult, 2.1 G SUPP Place 1 suppository rectally 2 (two) times daily. 11/04/14   Eugenie Filler, MD  HYDROcodone-acetaminophen (NORCO) 5-325 MG per tablet Take 1-2 tablets by mouth every 4 (four) hours as needed for moderate pain. 11/23/14   Mcarthur Rossetti, MD  insulin detemir (LEVEMIR) 100 UNIT/ML injection Inject 30 Units into the skin every morning.    Historical Provider, MD  insulin lispro (HUMALOG) 100 UNIT/ML injection Inject 10 Units into the skin 3 (three) times daily before meals.    Historical Provider, MD  levothyroxine (SYNTHROID, LEVOTHROID) 125 MCG tablet Take 1 tablet (125 mcg total) by mouth daily. 09/27/13   Lysbeth Penner, FNP  magnesium oxide (MAG-OX) 400 (241.3 MG) MG tablet Take 1 tablet (400 mg total) by mouth daily. 11/28/14   Florencia Reasons, MD  megestrol (MEGACE) 400 MG/10ML suspension Take 10 mLs (400 mg total) by mouth 2 (two) times daily. 11/28/14   Florencia Reasons, MD  metoCLOPramide (REGLAN) 5 MG/5ML solution Take 5 mLs (5 mg total) by mouth 4 (four) times daily -  before meals and at bedtime. 11/04/14   Eugenie Filler, MD  Multiple Vitamin (MULTIVITAMIN) tablet Take 1 tablet by mouth daily.    Historical Provider, MD  nitrofurantoin, macrocrystal-monohydrate, (MACROBID) 100 MG capsule Take 1 capsule (100 mg total) by mouth 2 (two) times daily.  01/27/15   Wardell Honour, MD  ondansetron (ZOFRAN ODT) 8 MG disintegrating tablet Take 1 tablet (8 mg total) by mouth every 8 (eight) hours as needed for nausea or vomiting. 10/09/14   Debbe Odea, MD  pantoprazole (PROTONIX) 40 MG tablet Take 1 tablet (40 mg total) by mouth daily. 07/31/14 03/30/17  Ripudeep Krystal Eaton, MD  rosuvastatin (CRESTOR) 40 MG tablet Take 1 tablet (40 mg total) by mouth daily. 09/27/13   Lysbeth Penner, FNP  senna-docusate (SENOKOT-S) 8.6-50 MG per tablet Take 2 tablets by mouth at bedtime. 11/28/14   Florencia Reasons, MD  sodium chloride 1 G tablet Take 1 tablet (1 g total) by mouth 2 (two) times daily with a meal. 11/28/14   Florencia Reasons, MD  spironolactone (  ALDACTONE) 25 MG tablet Take 1 tablet (25 mg total) by mouth daily. 12/28/14   Larey Dresser, MD  vitamin C (ASCORBIC ACID) 500 MG tablet Take 500 mg by mouth 2 (two) times daily.    Historical Provider, MD  Vitamin D, Ergocalciferol, (DRISDOL) 50000 UNITS CAPS capsule Take 50,000 Units by mouth every Saturday.  09/29/13   Historical Provider, MD  zinc sulfate 220 MG capsule Take 220 mg by mouth daily.    Historical Provider, MD   BP 126/92 mmHg  Pulse 81  Temp(Src) 98.1 F (36.7 C) (Oral)  Resp 18  SpO2 99% Physical Exam  Constitutional: She is oriented to person, place, and time. She appears well-developed and well-nourished. No distress.  HENT:  Mouth/Throat: Oropharynx is clear and moist.  Eyes: Conjunctivae are normal. No scleral icterus.  Neck: Neck supple. No tracheal deviation present. No thyromegaly present.  Cardiovascular: Normal rate, regular rhythm, normal heart sounds and intact distal pulses.   Pulmonary/Chest: Effort normal and breath sounds normal. No respiratory distress.  Abdominal: Soft. Normal appearance and bowel sounds are normal. She exhibits no distension and no mass. There is no tenderness. There is no rebound and no guarding.  Genitourinary:  No cva tenderness  Musculoskeletal: She exhibits no  edema.  Neurological: She is alert and oriented to person, place, and time.  Skin: Skin is warm and dry. No rash noted. She is not diaphoretic.  Pt with subcutaneous abscess right buttock, draining brownish/red purulent material. Surrounding erythema ?cellulitis. No crepitus.  Scaly, peeling rash to palms of hands. Normal cap refill distally. Dry scaly feet - pt indicates is baseline. No other skin rash, no urticaria.   Psychiatric: She has a normal mood and affect.  Nursing note and vitals reviewed.   ED Course  Procedures (including critical care time) Labs Review   Results for orders placed or performed during the hospital encounter of 02/03/15  CBC with Differential  Result Value Ref Range   WBC 10.7 (H) 4.0 - 10.5 K/uL   RBC 3.49 (L) 3.87 - 5.11 MIL/uL   Hemoglobin 10.2 (L) 12.0 - 15.0 g/dL   HCT 29.7 (L) 36.0 - 46.0 %   MCV 85.1 78.0 - 100.0 fL   MCH 29.2 26.0 - 34.0 pg   MCHC 34.3 30.0 - 36.0 g/dL   RDW 13.8 11.5 - 15.5 %   Platelets 464 (H) 150 - 400 K/uL   Neutrophils Relative % 76 43 - 77 %   Neutro Abs 8.2 (H) 1.7 - 7.7 K/uL   Lymphocytes Relative 11 (L) 12 - 46 %   Lymphs Abs 1.2 0.7 - 4.0 K/uL   Monocytes Relative 12 3 - 12 %   Monocytes Absolute 1.2 (H) 0.1 - 1.0 K/uL   Eosinophils Relative 1 0 - 5 %   Eosinophils Absolute 0.1 0.0 - 0.7 K/uL   Basophils Relative 0 0 - 1 %   Basophils Absolute 0.0 0.0 - 0.1 K/uL  Comprehensive metabolic panel  Result Value Ref Range   Sodium 134 (L) 135 - 145 mmol/L   Potassium 3.4 (L) 3.5 - 5.1 mmol/L   Chloride 104 101 - 111 mmol/L   CO2 20 (L) 22 - 32 mmol/L   Glucose, Bld 279 (H) 65 - 99 mg/dL   BUN 33 (H) 6 - 20 mg/dL   Creatinine, Ser 1.92 (H) 0.44 - 1.00 mg/dL   Calcium 9.9 8.9 - 10.3 mg/dL   Total Protein 7.3 6.5 - 8.1 g/dL  Albumin 3.5 3.5 - 5.0 g/dL   AST 12 (L) 15 - 41 U/L   ALT 13 (L) 14 - 54 U/L   Alkaline Phosphatase 149 (H) 38 - 126 U/L   Total Bilirubin 0.7 0.3 - 1.2 mg/dL   GFR calc non Af Amer 26 (L)  >60 mL/min   GFR calc Af Amer 31 (L) >60 mL/min   Anion gap 10 5 - 15  POC CBG, ED  Result Value Ref Range   Glucose-Capillary 247 (H) 65 - 99 mg/dL  I-Stat CG4 Lactic Acid, ED  Result Value Ref Range   Lactic Acid, Venous 1.70 0.5 - 2.0 mmol/L      MDM   Iv ns bolus. Labs.  Reviewed nursing notes and prior charts for additional history.   Abscess right buttock - spontaneously, but incompletely drained - will I and D.  INCISION AND DRAINAGE Performed by: Mirna Mires Consent: Verbal consent obtained. Risks and benefits: risks, benefits and alternatives were discussed Type: abscess  Body area: right buttock  Anesthesia: local infiltration  Incision was made with a scalpel.  Local anesthetic: lidocaine 2% w epinephrine  Anesthetic total: 5 ml  Complexity: complex Blunt dissection to break up loculations  Drainage: purulent  Drainage amount: large  Packing material: 1/2 in iodoform gauze  Patient tolerance: Patient tolerated the procedure well with no immediate complications.  kcl low.  kcl po.  AKI on labs. Suspect dehydration.  Additional ivf.   novolog sq.  Sterile dressing.  Suspect cellulitis surrounding abscess/buttock.  vanc and zosyn iv.   Medical service contacted for admission.        Lajean Saver, MD 02/03/15 [phone]

## 2015-02-03 NOTE — Progress Notes (Signed)
ANTIBIOTIC CONSULT NOTE - INITIAL  Pharmacy Consult for vanc/zosyn Indication: wound infection  Allergies  Allergen Reactions  . Sulfa Antibiotics Anaphylaxis and Swelling    Stiff neck also     Patient Measurements:   Adjusted Body Weight:   Vital Signs: Temp: 98.1 F (36.7 C) (05/27 1534) Temp Source: Oral (05/27 1534) BP: 118/33 mmHg (05/27 1915) Pulse Rate: 73 (05/27 1915) Intake/Output from previous day:   Intake/Output from this shift:    Labs:  Recent Labs  02/03/15 1555  WBC 10.7*  HGB 10.2*  PLT 464*  CREATININE 1.92*   CrCl cannot be calculated (Unknown ideal weight.). No results for input(s): VANCOTROUGH, VANCOPEAK, VANCORANDOM, GENTTROUGH, GENTPEAK, GENTRANDOM, TOBRATROUGH, TOBRAPEAK, TOBRARND, AMIKACINPEAK, AMIKACINTROU, AMIKACIN in the last 72 hours.   Microbiology: No results found for this or any previous visit (from the past 720 hour(s)).  Medical History: Past Medical History  Diagnosis Date  . Diabetes mellitus     on insulin, with h/o DKA   . HTN (hypertension)   . Hyperlipidemia   . Venous stasis ulcers   . Arthritis   . Depression     anxiety  . 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 Hypertension   . Coronary artery disease     angina.  MI.   . Pneumonia        . GERD (gastroesophageal reflux disease)     Hiatal hernia.   . Hypothyroidism   . CHF (congestive heart failure)   . Tuberculosis   . Osteoporosis     Medications:  Scheduled:  . aspirin EC  81 mg Oral Daily  . [START ON 02/04/2015] carvedilol  3.125 mg Oral BID WC  . enoxaparin (LOVENOX) injection  40 mg Subcutaneous Q24H  . escitalopram  10 mg Oral Daily  . feeding supplement (PRO-STAT SUGAR FREE 64)  30 mL Oral Daily  . [START ON 02/04/2015] ferrous sulfate  325 mg Oral BID WC  . Glycerin (Adult)  1 suppository Rectal BID  . [START ON 02/04/2015] insulin aspart  0-9 Units Subcutaneous TID WC  . [START ON 02/04/2015]  insulin detemir  30 Units Subcutaneous q morning - 10a  . levothyroxine  125 mcg Oral Daily  . magnesium oxide  400 mg Oral Daily  . megestrol  400 mg Oral BID  . metoCLOPramide  5 mg Oral TID AC & HS  . pantoprazole  40 mg Oral Daily  . potassium chloride  40 mEq Oral Once  . rosuvastatin  40 mg Oral Daily  . senna-docusate  2 tablet Oral QHS  . [START ON 02/04/2015] sodium chloride  1 g Oral BID WC  . spironolactone  25 mg Oral Daily  . vitamin C  500 mg Oral BID  . [START ON 02/04/2015] Vitamin D (Ergocalciferol)  50,000 Units Oral Q Sat  . zinc sulfate  220 mg Oral Daily   Infusions:  . sodium chloride     Assessment: 64 yo who was admitted for right buttock abscess that has been draining with foul odor. Vanc and zosyn have been ordered empirically. Her scr is 1.92 today with crcl ~30.   Goal of Therapy:  Vancomycin trough level 10-15 mcg/ml  Plan:   Vanc 1g IV q24 then 750mg  IV q24 Zosyn 3.375g IV q8 F/u with trough as needed  Onnie Boer, PharmD Pager: [phone] 02/03/2015 8:47 PM

## 2015-02-03 NOTE — ED Notes (Signed)
Pt husband would like to be notified if and when pt is admitted and the location she will be at. His cell # is [phone] and his home phone is [phone], his name is Grayland Ormond.

## 2015-02-03 NOTE — ED Notes (Signed)
Pt reports pressure ulcer on her right buttocks for the past three to four days. Pt states she has been in and out of the hospital and now has developed a pressure ulcer. Husband at bedside reports new weakness and lethargy. Pt also reports an allergic reaction from a medication she was taking for an UTI. Pt presents with skin peeling off her fingers and palms from the medication since Monday.

## 2015-02-03 NOTE — H&P (Addendum)
[facility] History and Physical  Bridget Martinez [id number] DOB: [birth year] DOA: 02/03/2015  Referring physician: Dr. Lajean Saver  PCP: Bridget Pretty, FNP   Chief Complaint: right buttock abscess   HPI:  Pt is 64 yo female with HTN, DM, HLD, hypothyroidism, anemia of chronic disease with baseline Hg ~9, presented to [facility] ED with main concern of one week duration of pressure ulcer in the right buttock area that has been draining with foul odor. Pt also reports associated tenderness to touch with warmth to touch. She describes pain in that area as pulsating and fairly constant, 7/10 in severity, occasionally but not consistently radiating to the right thigh area, no specific alleviating factors, aggravated by putting pressure to the area. Pt denies similar even in the past. Pt denies fevers, chills, no abd concerns. She explains she was recently started on Macrobid for UTI and has noted skin peeling on her hands 2 days piror to this admission. She was advised to stop takin ABX immediately which she did but her skin is still peeling.  In ED, pt noted to be stable with VSS, right buttock abscess noted and drained in ED. Pt started on vancomycin and zosyn. TRH asked to admit for further evaluation. Medical bed requested.   Assessment and Plan: Active Problems:   Right buttock ulcer with abscess with surrounding cellulitis  - drained in ED - looks better - continue vancomycin and zosyn for now and narrow down as clinically indicated    HTN - stable BP on admission, will continue to monitor - will continue home medical regimen with Spironolactone and Coreg    DM type II  - last A1C 12/2014 was 8.6 - will continue home regimen with Levemir and will add SSI sensitive coverage as well    HLD - continue statin    Recent UTI - urine culture requested - current ABX should be adequate in coverage    ? Allergic reaction  - to Macrobid  - provide supportive care, IVF, benadryl as  need for itching    Hypothyroidism - continue synthroid     Anemia of chronic disease, IDA - Hg at baseline ~9 - will check CBC in AM - continue iron supplementation    Hyponatremia - likely prerenal etiology  - will provide IVF and will repeat BMP in AM   Hypokalemia - supplement and repeat BMP in AM   Acute renal failure - pre renal etiology - provide IVF and repeat BMP in AM   DVT prophylaxis  - Lovenox SQ  Radiological Exams on Admission: No results found.  Code Status: Full Family Communication: Pt at bedside Disposition Plan: Admit for further evaluation    Mart Piggs [facility] [id number]  Review of Systems:  Constitutional: Negative for diaphoresis.  HENT: Negative for hearing loss, ear pain, nosebleeds, congestion, sore throat, neck pain, tinnitus and ear discharge.   Eyes: Negative for blurred vision, double vision, photophobia, pain, discharge and redness.  Respiratory: Negative for cough, hemoptysis, sputum production, shortness of breath, wheezing and stridor.   Cardiovascular: Negative for chest pain, palpitations, orthopnea, claudication and leg swelling.  Gastrointestinal: Negative for heartburn, constipation, blood in stool and melena.  Genitourinary: Negative for dysuria, urgency, frequency, hematuria and flank pain.  Musculoskeletal: Negative for myalgias, back pain, joint pain and falls.  Skin: skin peeling as outlined in HPI Neurological: Negative for dizziness, speech change, focal weakness, loss of consciousness and headaches.  Endo/Heme/Allergies: Negative for environmental allergies and polydipsia. Does not bruise/bleed easily.  Psychiatric/Behavioral: Negative for suicidal ideas. The patient is not nervous/anxious.      Past Medical History  Diagnosis Date  . Diabetes mellitus     on insulin, with h/o DKA   . HTN (hypertension)   . Hyperlipidemia   . Venous stasis ulcers   . Arthritis   . Depression     anxiety  . Myocardial infarction      NSTEMI 07/2011 with cardiogenic shock, s/p CABG 09/2011  . Hypertension   . Coronary artery disease     angina.  MI.   . Pneumonia        . GERD (gastroesophageal reflux disease)     Hiatal hernia.   . Hypothyroidism   . CHF (congestive heart failure)   . Tuberculosis   . Osteoporosis     Past Surgical History  Procedure Laterality Date  . Cardiac catheterization    . Tonsillectomy    . Coronary artery bypass graft      Dr. Prescott Gum in 09/2011   . Coronary artery bypass graft  2012  . Esophagogastroduodenoscopy N/A 11/17/2013    Procedure: ESOPHAGOGASTRODUODENOSCOPY (EGD);  Surgeon: Lafayette Dragon, MD;  Location: [facility] ENDOSCOPY;  Service: Endoscopy;  Laterality: N/A;  . Colonoscopy N/A 11/18/2013    Procedure: COLONOSCOPY;  Surgeon: Lafayette Dragon, MD;  Location: [facility] ENDOSCOPY;  Service: Endoscopy;  Laterality: N/A;  . Right heart catheterization N/A 07/29/2011    Procedure: RIGHT HEART CATH;  Surgeon: Minus Breeding, MD;  Location: [facility] CATH LAB;  Service: Cardiovascular;  Laterality: N/A;  . Left heart catheterization with coronary angiogram N/A 07/26/2011    Procedure: LEFT HEART CATHETERIZATION WITH CORONARY ANGIOGRAM;  Surgeon: Larey Dresser, MD;  Location: [facility] CATH LAB;  Service: Cardiovascular;  Laterality: N/A;  . Total hip arthroplasty Left 11/21/2014    Procedure: TOTAL HIP ARTHROPLASTY ANTERIOR APPROACH;  Surgeon: Mcarthur Rossetti, MD;  Location: [place];  Service: Orthopedics;  Laterality: Left;    Social History:  reports that she has never smoked. She does not have any smokeless tobacco history on file. She reports that she does not drink alcohol or use illicit drugs.  Allergies      Sulfa medications, Macrobid          Family History  Problem Relation Age of Onset  . Heart disease Father   . Multiple sclerosis Father   . Hypertension Mother   . Hyperthyroidism Mother   . Diabetes Cousin     Multiple maternal cousins with type 2 diabetes mellitus  . Diabetes  Maternal Uncle     Type 1 diabetes mellitus  . Heart attack Paternal Grandfather   . Heart disease Paternal Grandfather     Medication Sig  aspirin EC 81 MG tablet Take 1 tablet (81 mg total) by mouth daily.  carvedilol (COREG) 3.125 MG tablet Take 1 tablet (3.125 mg total) by mouth 2 (two) times daily with a meal.  escitalopram (LEXAPRO) 10 MG tablet Take 1 tablet (10 mg total) by mouth daily.  ferrous sulfate 325 (65 FE) MG tablet Take 1 tablet (325 mg total) by mouth 2 (two) times daily with a meal.  Glycerin, Adult, 2.1 G SUPP Place 1 suppository rectally 2 (two) times daily.  HYDROcodone-acetaminophen (NORCO) 5-325 MG per tablet Take 1-2 tablets by mouth every 4 (four) hours as needed for moderate pain.  insulin detemir (LEVEMIR) 100 UNIT/ML injection Inject 30 Units into the skin every morning.  insulin lispro (HUMALOG) 100 UNIT/ML injection Inject 10  Units into the skin 3 (three) times daily before meals.  levothyroxine (SYNTHROID, LEVOTHROID) 125 MCG tablet Take 1 tablet (125 mcg total) by mouth daily.  magnesium oxide (MAG-OX) 400 (241.3 MG) MG tablet Take 1 tablet (400 mg total) by mouth daily.  megestrol (MEGACE) 400 MG/10ML suspension Take 10 mLs (400 mg total) by mouth 2 (two) times daily.  metoCLOPramide (REGLAN) 5 MG/5ML solution Take 5 mLs (5 mg total) by mouth 4 (four) times daily -  before meals and at bedtime.  Multiple Vitamin (MULTIVITAMIN) tablet Take 1 tablet by mouth daily.  ondansetron (ZOFRAN ODT) 8 MG disintegrating tablet Take 1 tablet (8 mg total) by mouth every 8 (eight) hours as needed for nausea or vomiting.  pantoprazole (PROTONIX) 40 MG tablet Take 1 tablet (40 mg total) by mouth daily.  rosuvastatin (CRESTOR) 40 MG tablet Take 1 tablet (40 mg total) by mouth daily.  senna-docusate (SENOKOT-S) 8.6-50 MG per tablet Take 2 tablets by mouth at bedtime.  sodium chloride 1 G tablet Take 1 tablet (1 g total) by mouth 2 (two) times daily with a meal.   spironolactone (ALDACTONE) 25 MG tablet Take 1 tablet (25 mg total) by mouth daily.  vitamin C (ASCORBIC ACID) 500 MG tablet Take 500 mg by mouth 2 (two) times daily.  Vitamin D, Ergocalciferol, (DRISDOL) 50000 UNITS CAPS capsule Take 50,000 Units by mouth every Saturday.   zinc sulfate 220 MG capsule Take 220 mg by mouth daily.    Physical Exam: Filed Vitals:   02/03/15 1718 02/03/15 1745 02/03/15 1830 02/03/15 1845  BP:  120/39 120/38 115/49  Pulse:  74 75 73  Temp:      TempSrc:      Resp:      SpO2: 99% 100% 100% 100%    Physical Exam  Constitutional: Appears well-developed and well-nourished. No distress.  HENT: Normocephalic. External right and left ear normal. Oropharynx is clear and moist.  Eyes: Conjunctivae and EOM are normal. PERRLA, no scleral icterus.  Neck: Normal ROM. Neck supple. No JVD. No tracheal deviation. No thyromegaly.  CVS: RRR, S1/S2 +, no gallops, no carotid bruit.  Pulmonary: Effort and breath sounds normal, no stridor, rhonchi, wheezes, rales.  Abdominal: Soft. BS +,  no distension, tenderness, rebound or guarding.  Musculoskeletal: Normal range of motion. No edema and no tenderness.  Lymphadenopathy: No lymphadenopathy noted, cervical, inguinal. Neuro: Alert. Normal reflexes, muscle tone coordination. No cranial nerve deficit. Skin: Pt with subcutaneous abscess right buttock, draining brownish/red purulent material. Surrounding erythema, no crepitus.  Scaly, peeling rash to palms of hands. Normal cap refill distally. Dry scaly feet - pt indicates is baseline. No other skin rash, no urticaria.  Psychiatric: Normal mood and affect. Behavior, judgment, thought content normal.   Labs on Admission:  Basic Metabolic Panel:  Recent Labs Lab 02/03/15 1555  NA 134*  K 3.4*  CL 104  CO2 20*  GLUCOSE 279*  BUN 33*  CREATININE 1.92*  CALCIUM 9.9   Liver Function Tests:  Recent Labs Lab 02/03/15 1555  AST 12*  ALT 13*  ALKPHOS 149*  BILITOT  0.7  PROT 7.3  ALBUMIN 3.5   CBC:  Recent Labs Lab 02/03/15 1555  WBC 10.7*  NEUTROABS 8.2*  HGB 10.2*  HCT 29.7*  MCV 85.1  PLT 464*   CBG:  Recent Labs Lab 02/03/15 1553  GLUCAP 247*    EKG: Normal sinus rhythm, no ST/T wave changes   If 7PM-7AM, please contact night-coverage [email or web address] Password [facility] 02/03/2015,  7:10 PM

## 2015-02-03 NOTE — ED Notes (Signed)
Pt has pressure sore to right buttock cheek. Draining purulent/blood drainage. 1 cm in length and width approximately. Unable to see how deep this goes due to drainage and pt position. Pt turned to left side to keep pressure off this sore.

## 2015-02-03 NOTE — ED Notes (Signed)
IV attempts by this RN unsuccessful. Will have second RN attempt.

## 2015-02-04 DIAGNOSIS — E1129 Type 2 diabetes mellitus with other diabetic kidney complication: Secondary | ICD-10-CM

## 2015-02-04 DIAGNOSIS — N179 Acute kidney failure, unspecified: Secondary | ICD-10-CM

## 2015-02-04 DIAGNOSIS — N058 Unspecified nephritic syndrome with other morphologic changes: Secondary | ICD-10-CM

## 2015-02-04 LAB — BASIC METABOLIC PANEL
Anion gap: 12 (ref 5–15)
BUN: 30 mg/dL — ABNORMAL HIGH (ref 6–20)
CHLORIDE: 105 mmol/L (ref 101–111)
CO2: 16 mmol/L — ABNORMAL LOW (ref 22–32)
Calcium: 9.1 mg/dL (ref 8.9–10.3)
Creatinine, Ser: 1.71 mg/dL — ABNORMAL HIGH (ref 0.44–1.00)
GFR calc Af Amer: 35 mL/min — ABNORMAL LOW (ref >60)
GFR calc non Af Amer: 30 mL/min — ABNORMAL LOW (ref >60)
Glucose, Bld: 451 mg/dL — ABNORMAL HIGH (ref 65–99)
POTASSIUM: 3.8 mmol/L (ref 3.5–5.1)
Sodium: 133 mmol/L — ABNORMAL LOW (ref 135–145)

## 2015-02-04 LAB — URINE MICROSCOPIC-ADD ON

## 2015-02-04 LAB — URINALYSIS, ROUTINE W REFLEX MICROSCOPIC
BILIRUBIN URINE: NEGATIVE
Ketones, ur: NEGATIVE mg/dL
Nitrite: POSITIVE — AB
PROTEIN: 30 mg/dL — AB
Specific Gravity, Urine: 1.018 (ref 1.005–1.030)
UROBILINOGEN UA: 0.2 mg/dL (ref 0.0–1.0)
pH: 5 (ref 5.0–8.0)

## 2015-02-04 LAB — CBC
HCT: 27.7 % — ABNORMAL LOW (ref 36.0–46.0)
Hemoglobin: 9.3 g/dL — ABNORMAL LOW (ref 12.0–15.0)
MCH: 28.8 pg (ref 26.0–34.0)
MCHC: 33.6 g/dL (ref 30.0–36.0)
MCV: 85.8 fL (ref 78.0–100.0)
Platelets: 395 10*3/uL (ref 150–400)
RBC: 3.23 MIL/uL — ABNORMAL LOW (ref 3.87–5.11)
RDW: 14 % (ref 11.5–15.5)
WBC: 8.5 10*3/uL (ref 4.0–10.5)

## 2015-02-04 LAB — GLUCOSE, CAPILLARY
GLUCOSE-CAPILLARY: 453 mg/dL — AB (ref 65–99)
GLUCOSE-CAPILLARY: 349 mg/dL — AB (ref 65–99)
Glucose-Capillary: 202 mg/dL — ABNORMAL HIGH (ref 65–99)
Glucose-Capillary: 67 mg/dL (ref 65–99)
Glucose-Capillary: 140 mg/dL — ABNORMAL HIGH (ref 65–99)

## 2015-02-04 MED ORDER — ENOXAPARIN SODIUM 30 MG/0.3ML ~~LOC~~ SOLN
30.0000 mg | SUBCUTANEOUS | Status: DC
  Administered 2015-02-04 – 2015-02-05 (×2): 30 mg via SUBCUTANEOUS
  Filled 2015-02-04 (×3): qty 0.3

## 2015-02-04 MED ORDER — INSULIN ASPART 100 UNIT/ML ~~LOC~~ SOLN
8.0000 [IU] | Freq: Three times a day (TID) | SUBCUTANEOUS | Status: DC
  Administered 2015-02-04 – 2015-02-06 (×7): 8 [IU] via SUBCUTANEOUS

## 2015-02-04 NOTE — Progress Notes (Addendum)
[facility] PROGRESS NOTE  Bridget Martinez [id number] DOB: 08-05-1951 DOA: 02/03/2015 PCP: [name], FNP  Assessment/Plan: 64 yo female with PMH of HTN, DM, HLD, CAD h/o CABG, CHF, CKD, Hypothyroidism, presented to [facility] ED with main concern of one week duration of pressure ulcer in the right buttock area that has been draining with foul odor.  -ED, pt noted right buttock abscess-> drained in ED. Pt started on vancomycin and zosyn. TRH asked to admit for further evaluation. Patient also found to have UTI  1. Right buttock ulcer with abscess with surrounding cellulitis. drained in ED -afebrile, no leukocytosis, but erythema, cellulitis. Will continue vancomycin and zosyn. Deescalate as needed  2. HTN. stable BP on admission, will continue to monitor 3. DM type II. Uncontrolled.last A1C 12/2014 was 8.6. Home regimen: novolog 10 TID+ lantus 30 -will resume home regimen,. Titrate insulin as needed  4. Recent UTI. Cont IV atx. urine culture requested 5. AKI on CKD II on admission. Cr likely baseline around 1.5 6. CHF. Chronic HF, systolic HF, pulmonary HTN. D/c IVF. restarted spironolactone. Clinically stable. Monitor. diurese as needed   Code Status: full Family Communication: d/w patient (indicate person spoken with, relationship, and if by phone, the number) Disposition Plan: home 2-3 days. Pend clinical improvement    Consultants:  none  Procedures:  I&D in ED  Antibiotics:  Vanc/zosyn 5/27>>>>   (indicate start date, and stop date if known)  HPI/Subjective: Alert, oriented   Objective: Filed Vitals:   02/04/15 0527  BP: 118/38  Pulse: 71  Temp: 97.9 F (36.6 C)  Resp: 20   No intake or output data in the 24 hours ending 02/04/15 0853 Filed Weights   02/03/15 2000  Weight: 63.2 kg (139 lb 5.3 oz)    Exam:   General:  alert  Cardiovascular: s1,s2 rrr  Respiratory: CTA BL  Abdomen: soft, nt,nd   Musculoskeletal: no lege dema   Data  Reviewed: Basic Metabolic Panel:  Recent Labs Lab 02/03/15 1555 02/04/15 0523  NA 134* 133*  K 3.4* 3.8  CL 104 105  CO2 20* 16*  GLUCOSE 279* 451*  BUN 33* 30*  CREATININE 1.92* 1.71*  CALCIUM 9.9 9.1   Liver Function Tests:  Recent Labs Lab 02/03/15 1555  AST 12*  ALT 13*  ALKPHOS 149*  BILITOT 0.7  PROT 7.3  ALBUMIN 3.5   No results for input(s): LIPASE, AMYLASE in the last 168 hours. No results for input(s): AMMONIA in the last 168 hours. CBC:  Recent Labs Lab 02/03/15 1555 02/04/15 0523  WBC 10.7* 8.5  NEUTROABS 8.2*  --   HGB 10.2* 9.3*  HCT 29.7* 27.7*  MCV 85.1 85.8  PLT 464* 395   Cardiac Enzymes: No results for input(s): CKTOTAL, CKMB, CKMBINDEX, TROPONINI in the last 168 hours. BNP (last 3 results)  Recent Labs  11/15/14 1500 11/20/14 0150 12/28/14 1140  BNP 423.4* 321.3* 192.0*    ProBNP (last 3 results)  Recent Labs  06/15/14 1035 07/26/14 1800  PROBNP 650.1* 2156.0*    CBG:  Recent Labs Lab 02/03/15 1553 02/04/15 0748  GLUCAP 247* 453*    No results found for this or any previous visit (from the past 240 hour(s)).   Studies: No results found.  Scheduled Meds: . aspirin EC  81 mg Oral Daily  . carvedilol  3.125 mg Oral BID WC  . enoxaparin (LOVENOX) injection  40 mg Subcutaneous Q24H  . escitalopram  10 mg Oral Daily  . feeding supplement (  PRO-STAT SUGAR FREE 64)  30 mL Oral Daily  . ferrous sulfate  325 mg Oral BID WC  . Glycerin (Adult)  1 suppository Rectal BID  . insulin aspart  0-9 Units Subcutaneous TID WC  . insulin detemir  30 Units Subcutaneous q morning - 10a  . levothyroxine  125 mcg Oral Daily  . magnesium oxide  400 mg Oral Daily  . megestrol  400 mg Oral BID  . metoCLOPramide  5 mg Oral TID AC & HS  . pantoprazole  40 mg Oral Daily  . piperacillin-tazobactam (ZOSYN)  IV  3.375 g Intravenous Q8H  . rosuvastatin  40 mg Oral Daily  . senna-docusate  2 tablet Oral QHS  . sodium chloride  1 g Oral  BID WC  . spironolactone  25 mg Oral Daily  . vancomycin  750 mg Intravenous Q24H  . vitamin C  500 mg Oral BID  . Vitamin D (Ergocalciferol)  50,000 Units Oral Q Sat  . zinc sulfate  220 mg Oral Daily   Continuous Infusions: . sodium chloride 75 mL/hr at 02/03/15 2205    Active Problems:   Allergic dermatitis   Pressure ulcer   Abscess of buttock, right    Time spent: >35 minutes     Kinnie Feil  [facility] Pager [phone]. If 7PM-7AM, please contact night-coverage at [email or web address], password [facility] 02/04/2015, 8:53 AM  LOS: 1 day

## 2015-02-04 NOTE — Progress Notes (Signed)
Patient arrived to 4N06 at 2000 alert & oriented no complaints of pain voiced.  Will continue to monitor.

## 2015-02-05 ENCOUNTER — Encounter (HOSPITAL_COMMUNITY): Payer: Self-pay | Admitting: *Deleted

## 2015-02-05 DIAGNOSIS — N3000 Acute cystitis without hematuria: Secondary | ICD-10-CM

## 2015-02-05 DIAGNOSIS — L8990 Pressure ulcer of unspecified site, unspecified stage: Secondary | ICD-10-CM

## 2015-02-05 LAB — GLUCOSE, CAPILLARY
GLUCOSE-CAPILLARY: 233 mg/dL — AB (ref 65–99)
GLUCOSE-CAPILLARY: 78 mg/dL (ref 65–99)
Glucose-Capillary: 224 mg/dL — ABNORMAL HIGH (ref 65–99)
Glucose-Capillary: 328 mg/dL — ABNORMAL HIGH (ref 65–99)

## 2015-02-05 NOTE — Progress Notes (Signed)
[facility] PROGRESS NOTE  Bridget Martinez [id number] DOB: 04/29/1951 DOA: 02/03/2015 PCP: [name], FNP  Assessment/Plan: 64 yo female with PMH of HTN, DM, HLD, CAD h/o CABG, CHF, CKD, Hypothyroidism, presented to [facility] ED with main concern of one week duration of pressure ulcer in the right buttock area that has been draining with foul odor.  -ED, pt noted right buttock abscess-> drained in ED. Pt started on vancomycin and zosyn. TRH asked to admit for further evaluation.   1. Right buttock ulcer with abscess with surrounding cellulitis. drained in ED -afebrile, no leukocytosis, but erythema, cellulitis. Will continue zosyn for another 24-48 hrs. D c/ vanc (5/27-5/29). Deescalate as needed  2. HTN. Stable BP on admission, will continue to monitor 3. DM type II. Uncontrolled. last A1C 12/2014 was 8.6. Home regimen: novolog 10 TID+ lantus 30 -cont lantus+ resume novolog at 8 Units due to mild hypoglycemia. Titrate insulin as needed  4. Recent UTI. Did not complete the treatment regimen at outpatient. Cont IV atx. urine culture -pend  5. AKI on CKD II on admission. Cr likely baseline around 1.5 6. CHF. Chronic HF, systolic HF, pulmonary HTN. D/c IVF. restarted spironolactone. Cont BB, ASA. Clinically stable. Monitor. diurese as needed   Code Status: full Family Communication: d/w patient (indicate person spoken with, relationship, and if by phone, the number) Disposition Plan: home 2-3 days. Pend clinical improvement    Consultants:  none  Procedures:  I&D in ED  Antibiotics:  Vanc 5/27-5/29  zosyn 5/27>>>>   (indicate start date, and stop date if known)  HPI/Subjective: Alert, oriented   Objective: Filed Vitals:   02/05/15 0610  BP: 102/40  Pulse: 75  Temp: 98.2 F (36.8 C)  Resp: 18    Intake/Output Summary (Last 24 hours) at 02/05/15 0851 Last data filed at 02/05/15 [id number]  Gross per 24 hour  Intake      0 ml  Output    900 ml  Net   -900 ml    Filed Weights   02/03/15 2000  Weight: 63.2 kg (139 lb 5.3 oz)    Exam:   General:  alert  Cardiovascular: s1,s2 rrr  Respiratory: CTA BL  Abdomen: soft, nt,nd   Musculoskeletal: no lege dema   Data Reviewed: Basic Metabolic Panel:  Recent Labs Lab 02/03/15 1555 02/04/15 0523  NA 134* 133*  K 3.4* 3.8  CL 104 105  CO2 20* 16*  GLUCOSE 279* 451*  BUN 33* 30*  CREATININE 1.92* 1.71*  CALCIUM 9.9 9.1   Liver Function Tests:  Recent Labs Lab 02/03/15 1555  AST 12*  ALT 13*  ALKPHOS 149*  BILITOT 0.7  PROT 7.3  ALBUMIN 3.5   No results for input(s): LIPASE, AMYLASE in the last 168 hours. No results for input(s): AMMONIA in the last 168 hours. CBC:  Recent Labs Lab 02/03/15 1555 02/04/15 0523  WBC 10.7* 8.5  NEUTROABS 8.2*  --   HGB 10.2* 9.3*  HCT 29.7* 27.7*  MCV 85.1 85.8  PLT 464* 395   Cardiac Enzymes: No results for input(s): CKTOTAL, CKMB, CKMBINDEX, TROPONINI in the last 168 hours. BNP (last 3 results)  Recent Labs  11/15/14 1500 11/20/14 0150 12/28/14 1140  BNP 423.4* 321.3* 192.0*    ProBNP (last 3 results)  Recent Labs  06/15/14 1035 07/26/14 1800  PROBNP 650.1* 2156.0*    CBG:  Recent Labs Lab 02/04/15 1221 02/04/15 1724 02/04/15 2157 02/04/15 2241 02/05/15 0653  GLUCAP 349* 202* 67 140* 224*  No results found for this or any previous visit (from the past 240 hour(s)).   Studies: No results found.  Scheduled Meds: . aspirin EC  81 mg Oral Daily  . carvedilol  3.125 mg Oral BID WC  . enoxaparin (LOVENOX) injection  30 mg Subcutaneous Q24H  . escitalopram  10 mg Oral Daily  . feeding supplement (PRO-STAT SUGAR FREE 64)  30 mL Oral Daily  . ferrous sulfate  325 mg Oral BID WC  . Glycerin (Adult)  1 suppository Rectal BID  . insulin aspart  0-9 Units Subcutaneous TID WC  . insulin aspart  8 Units Subcutaneous TID WC  . insulin detemir  30 Units Subcutaneous q morning - 10a  . levothyroxine  125 mcg  Oral Daily  . magnesium oxide  400 mg Oral Daily  . megestrol  400 mg Oral BID  . metoCLOPramide  5 mg Oral TID AC & HS  . pantoprazole  40 mg Oral Daily  . piperacillin-tazobactam (ZOSYN)  IV  3.375 g Intravenous Q8H  . rosuvastatin  40 mg Oral Daily  . senna-docusate  2 tablet Oral QHS  . sodium chloride  1 g Oral BID WC  . spironolactone  25 mg Oral Daily  . vancomycin  750 mg Intravenous Q24H  . vitamin C  500 mg Oral BID  . Vitamin D (Ergocalciferol)  50,000 Units Oral Q Sat  . zinc sulfate  220 mg Oral Daily   Continuous Infusions:    Active Problems:   Allergic dermatitis   Pressure ulcer   Abscess of buttock, right    Time spent: >35 minutes     Kinnie Feil  [facility] Pager [phone]. If 7PM-7AM, please contact night-coverage at [email or web address], password [facility] 02/05/2015, 8:51 AM  LOS: 2 days

## 2015-02-05 NOTE — Progress Notes (Signed)
Abscess on right buttocks cleansed with sterile NS, patted dry and packed with silver hydrofiber.  Area covered with 4x4 gauze dressing and tape.  Cleansed peri area and patted dry, applied moisture barrier cream.  Patient placed on her left side.  Additionally, prevalon boots applied to feet.  Patient tolerated well.  Will continue to monitor patient.

## 2015-02-05 NOTE — Consult Note (Signed)
WOC wound consult note Reason for Consult:Right buttock abscess, I&D performed in ED yesterday Wound type:Infectious Pressure Ulcer POA: No Measurement:1cm x 0.6cm x 5cm  Wound [id number] to visualize inside of wound Drainage (amount, consistency, odor) thick, purulent drainage with strong odor. Patient is on systemic antibiotics. Periwound: intact, indurated to 10 cm at 2 o'clock, soft surrounding otherwise. No erythema. Patient denies pain. Dressing procedure/placement/frequency: I will provide guidance for the Nursing staff with orders for daily wound care using a silver hydrofiber (Aquacel Ag+, Kellie Simmering [phone]) packing following thorough cleansing with NS.  As patient's heels are both peeling and erythematous from a medication response, I will provide bilateral pressure redistribution heel boots.  A Geomat pressure redistribution chair pad is provided for her comfort while OOB in chair. [place] nursing team will not follow, but will remain available to this patient, the nursing and medical teams.  Please re-consult if needed. Thanks, Maudie Flakes, MSN, RN, [place], [place], [place] [phone])

## 2015-02-06 DIAGNOSIS — I5042 Chronic combined systolic (congestive) and diastolic (congestive) heart failure: Secondary | ICD-10-CM

## 2015-02-06 DIAGNOSIS — L0231 Cutaneous abscess of buttock: Secondary | ICD-10-CM | POA: Diagnosis present

## 2015-02-06 LAB — URINE CULTURE: Colony Count: 25000

## 2015-02-06 LAB — BASIC METABOLIC PANEL
ANION GAP: 11 (ref 5–15)
BUN: 17 mg/dL (ref 6–20)
CO2: 20 mmol/L — AB (ref 22–32)
Calcium: 9 mg/dL (ref 8.9–10.3)
Chloride: 103 mmol/L (ref 101–111)
Creatinine, Ser: 1.3 mg/dL — ABNORMAL HIGH (ref 0.44–1.00)
GFR calc Af Amer: 49 mL/min — ABNORMAL LOW (ref >60)
GFR calc non Af Amer: 42 mL/min — ABNORMAL LOW (ref >60)
Glucose, Bld: 220 mg/dL — ABNORMAL HIGH (ref 65–99)
POTASSIUM: 3.5 mmol/L (ref 3.5–5.1)
Sodium: 134 mmol/L — ABNORMAL LOW (ref 135–145)

## 2015-02-06 LAB — CBC
HCT: 30 % — ABNORMAL LOW (ref 36.0–46.0)
HEMOGLOBIN: 10.1 g/dL — AB (ref 12.0–15.0)
MCH: 29.2 pg (ref 26.0–34.0)
MCHC: 33.7 g/dL (ref 30.0–36.0)
MCV: 86.7 fL (ref 78.0–100.0)
PLATELETS: 422 10*3/uL — AB (ref 150–400)
RBC: 3.46 MIL/uL — AB (ref 3.87–5.11)
RDW: 14.1 % (ref 11.5–15.5)
WBC: 6.2 10*3/uL (ref 4.0–10.5)

## 2015-02-06 LAB — GLUCOSE, CAPILLARY
GLUCOSE-CAPILLARY: 141 mg/dL — AB (ref 65–99)
GLUCOSE-CAPILLARY: 167 mg/dL — AB (ref 65–99)
Glucose-Capillary: 175 mg/dL — ABNORMAL HIGH (ref 65–99)
Glucose-Capillary: 220 mg/dL — ABNORMAL HIGH (ref 65–99)

## 2015-02-06 MED ORDER — VANCOMYCIN HCL IN DEXTROSE 750-5 MG/150ML-% IV SOLN
750.0000 mg | INTRAVENOUS | Status: DC
  Administered 2015-02-06: 750 mg via INTRAVENOUS
  Filled 2015-02-06 (×2): qty 150

## 2015-02-06 MED ORDER — NYSTATIN 100000 UNIT/GM EX CREA
TOPICAL_CREAM | Freq: Two times a day (BID) | CUTANEOUS | Status: DC
  Administered 2015-02-06 – 2015-02-09 (×7): via TOPICAL
  Filled 2015-02-06 (×2): qty 15

## 2015-02-06 MED ORDER — ENOXAPARIN SODIUM 40 MG/0.4ML ~~LOC~~ SOLN
40.0000 mg | SUBCUTANEOUS | Status: DC
  Administered 2015-02-06 – 2015-02-08 (×3): 40 mg via SUBCUTANEOUS
  Filled 2015-02-06 (×3): qty 0.4

## 2015-02-06 NOTE — Progress Notes (Addendum)
ANTIBIOTIC CONSULT NOTE - Follow-up  Pharmacy Consult for vancomycin Indication: wound infection  Allergies  Allergen Reactions  . Penicillins Anaphylaxis and Hives  . Sulfa Antibiotics Anaphylaxis and Swelling    Stiff neck also   . Macrobid [employer] Macro] Other (See Comments)    Skin peels off    Patient Measurements: Height: 5\' 2"  (157.5 cm) Weight: 139 lb 5.3 oz (63.2 kg) IBW/kg (Calculated) : 50.1  Vital Signs: Temp: 98.8 F (37.1 C) (05/30 0916) Temp Source: Oral (05/30 0916) BP: 107/43 mmHg (05/30 0916) Pulse Rate: 91 (05/30 0916) Intake/Output from previous day: 05/29 0701 - 05/30 0700 In: -  Out: 450 [Urine:450] Intake/Output from this shift:    Labs:  Recent Labs  02/03/15 1555 02/04/15 0523 02/06/15 0825  WBC 10.7* 8.5 6.2  HGB 10.2* 9.3* 10.1*  PLT 464* 395 422*  CREATININE 1.92* 1.71* 1.30*   Estimated Creatinine Clearance: 38.2 mL/min (by C-G formula based on Cr of 1.3). No results for input(s): VANCOTROUGH, VANCOPEAK, VANCORANDOM, GENTTROUGH, GENTPEAK, GENTRANDOM, TOBRATROUGH, TOBRAPEAK, TOBRARND, AMIKACINPEAK, AMIKACINTROU, AMIKACIN in the last 72 hours.   Microbiology: Recent Results (from the past 720 hour(s))  Urine culture     Status: None   Collection Time: 02/04/15 11:18 AM  Result Value Ref Range Status   Specimen Description URINE, CATHETERIZED  Final   Special Requests NONE  Final   Colony Count   Final    25,000 COLONIES/ML Performed at [facility]    Culture   Final    ESCHERICHIA COLI Performed at [facility]    Report Status 02/06/2015 FINAL  Final   Organism ID, Bacteria ESCHERICHIA COLI  Final      Susceptibility   Escherichia coli - MIC*    AMPICILLIN >=32 RESISTANT Resistant     CEFAZOLIN >=64 RESISTANT Resistant     CEFTRIAXONE <=1 SENSITIVE Sensitive     CIPROFLOXACIN >=4 RESISTANT Resistant     GENTAMICIN >=16 RESISTANT Resistant     LEVOFLOXACIN >=8 RESISTANT Resistant    NITROFURANTOIN <=16 SENSITIVE Sensitive     TOBRAMYCIN 8 INTERMEDIATE Intermediate     TRIMETH/SULFA >=320 RESISTANT Resistant     PIP/TAZO >=128 RESISTANT Resistant     * ESCHERICHIA COLI   Assessment: 64 yo who was admitted for right buttock abscess that has been draining with foul odor. Vanc and zosyn have been ordered empirically but vancomycin was discontinued yesterday. However, now restarting. Pt is afebrile and WBC is WNL. Scr has improved from admission and zosyn dose is appropriate.   Goal of Therapy:  Eradication of infection  Plan:  - Vancomycin 750mg  IV Q24H - Continue zosyn 3.375gm IV Q8H (4 hr inf) - F/u renal fxn, C&S, clinical status and trough at [facility], PharmD, BCPS Pager # [phone] 02/06/2015 12:43 PM

## 2015-02-06 NOTE — Consult Note (Signed)
Reason for Consult:  Buttocks abscess Referring Physician: Mendel Corning, MD  Bridget Martinez is an 64 y.o. female.  HPI: Pt admitted to medicine service on 02/03/15 after I&D of right buttocks abscess by ER physician.  She has multiple medical issue as noted below.  She is on day 4 of Zosyn, and has 1.5 days of Vancomycin stopped on 5/28.  She has been afebrile, WBC is normal. No films. No cultures.  Dr. Tana Coast looked at wound today and it is still draining pus and ask [place] to see.  Past Medical History  Diagnosis Date  . Diabetes mellitus     on insulin, with h/o DKA   . HTN (hypertension)   . Hyperlipidemia   . Venous stasis ulcers   . Arthritis   . Depression     anxiety  . 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 Hypertension   . Coronary artery disease     angina.  MI.   . Pneumonia        . GERD (gastroesophageal reflux disease)     Hiatal hernia.   . Hypothyroidism   . CHF (congestive heart failure)   . Tuberculosis   . Osteoporosis     Past Surgical History  Procedure Laterality Date  . Cardiac catheterization    . Tonsillectomy    . Coronary artery bypass graft      Dr. Prescott Gum in 09/2011   . Coronary artery bypass graft  2012  . Esophagogastroduodenoscopy N/A 11/17/2013    Procedure: ESOPHAGOGASTRODUODENOSCOPY (EGD);  Surgeon: Lafayette Dragon, MD;  Location: [facility] ENDOSCOPY;  Service: Endoscopy;  Laterality: N/A;  . Colonoscopy N/A 11/18/2013    Procedure: COLONOSCOPY;  Surgeon: Lafayette Dragon, MD;  Location: [facility] ENDOSCOPY;  Service: Endoscopy;  Laterality: N/A;  . Right heart catheterization N/A 07/29/2011    Procedure: RIGHT HEART CATH;  Surgeon: Minus Breeding, MD;  Location: [facility] CATH LAB;  Service: Cardiovascular;  Laterality: N/A;  . Left heart catheterization with coronary angiogram N/A 07/26/2011    Procedure: LEFT HEART CATHETERIZATION WITH CORONARY ANGIOGRAM;  Surgeon: Larey Dresser, MD;  Location: [facility] CATH LAB;  Service:  Cardiovascular;  Laterality: N/A;  . Total hip arthroplasty Left 11/21/2014    Procedure: TOTAL HIP ARTHROPLASTY ANTERIOR APPROACH;  Surgeon: Mcarthur Rossetti, MD;  Location: [place] [facility];  Service: Orthopedics;  Laterality: Left;    Family History  Problem Relation Age of Onset  . Heart disease Father   . Multiple sclerosis Father   . Hypertension Mother   . Hyperthyroidism Mother   . Diabetes Cousin     Multiple maternal cousins with type 2 diabetes mellitus  . Diabetes Maternal Uncle     Type 1 diabetes mellitus  . Heart attack Paternal Grandfather   . Heart disease Paternal Grandfather     Social History:  reports that she has never smoked. She does not have any smokeless tobacco history on file. She reports that she does not drink alcohol or use illicit drugs.  Allergies:  Allergies  Allergen Reactions  . Penicillins Anaphylaxis and Hives  . Sulfa Antibiotics Anaphylaxis and Swelling    Stiff neck also   . Macrobid [employer] Macro] Other (See Comments)    Skin peels off    Medications:  Prior to Admission:  Prescriptions prior to admission  Medication Sig Dispense Refill Last Dose  . aspirin EC 81 MG tablet Take 1 tablet (81 mg  total) by mouth daily. 90 tablet 3 02/03/2015 at Unknown time  . buPROPion (WELLBUTRIN XL) 150 MG 24 hr tablet Take 150 mg by mouth 2 (two) times daily.  0 01/31/2015 at Unknown time  . carvedilol (COREG) 3.125 MG tablet Take 1 tablet (3.125 mg total) by mouth 2 (two) times daily with a meal. 60 tablet 3 02/03/2015 at 0715  . escitalopram (LEXAPRO) 10 MG tablet Take 1 tablet (10 mg total) by mouth daily. 30 tablet 11 02/03/2015 at Unknown time  . ferrous sulfate 325 (65 FE) MG tablet Take 1 tablet (325 mg total) by mouth 2 (two) times daily with a meal. 30 tablet 3 02/03/2015 at Unknown time  . insulin lispro (HUMALOG) 100 UNIT/ML injection Inject 6-10 Units into the skin 3 (three) times daily before meals. Take 10 units with breakfast and  lunch.  Take 6 units with supper.   02/03/2015 at Unknown time  . KLOR-CON M20 20 MEQ tablet Take 20 mEq by mouth daily.   02/03/2015 at Unknown time  . LEVEMIR FLEXTOUCH 100 UNIT/ML Pen Inject 24 Units into the skin every morning.  0 02/03/2015 at Unknown time  . levothyroxine (SYNTHROID, LEVOTHROID) 125 MCG tablet Take 1 tablet (125 mcg total) by mouth daily. 30 tablet 11 02/03/2015 at Unknown time  . magnesium oxide (MAG-OX) 400 (241.3 MG) MG tablet Take 1 tablet (400 mg total) by mouth daily. 30 tablet 0 02/03/2015 at Unknown time  . megestrol (MEGACE) 400 MG/10ML suspension Take 10 mLs (400 mg total) by mouth 2 (two) times daily. 240 mL 0 02/03/2015 at Unknown time  . metoCLOPramide (REGLAN) 5 MG tablet Take 5 mg by mouth 3 (three) times daily before meals.   0 02/03/2015 at Unknown time  . Multiple Vitamin (MULTIVITAMIN) tablet Take 1 tablet by mouth daily.   02/03/2015 at Unknown time  . ondansetron (ZOFRAN ODT) 8 MG disintegrating tablet Take 1 tablet (8 mg total) by mouth every 8 (eight) hours as needed for nausea or vomiting. 20 tablet 1 Past Month at Unknown time  . pantoprazole (PROTONIX) 40 MG tablet Take 1 tablet (40 mg total) by mouth daily. 30 tablet 3 02/03/2015 at Unknown time  . rosuvastatin (CRESTOR) 40 MG tablet Take 1 tablet (40 mg total) by mouth daily. 30 tablet 11 02/03/2015 at Unknown time  . spironolactone (ALDACTONE) 25 MG tablet Take 1 tablet (25 mg total) by mouth daily.   02/03/2015 at Unknown time  . tolnaftate (TINACTIN) 1 % spray Apply 1 application topically daily.   02/03/2015 at Unknown time  . BAYER CONTOUR NEXT TEST test strip 3 (three) times daily. for testing  10   . BAYER MICROLET LANCETS lancets   3   . Vitamin D, Ergocalciferol, (DRISDOL) 50000 UNITS CAPS capsule Take 50,000 Units by mouth every Saturday.    01/28/2015   Scheduled: . aspirin EC  81 mg Oral Daily  . carvedilol  3.125 mg Oral BID WC  . enoxaparin (LOVENOX) injection  30 mg Subcutaneous Q24H  .  escitalopram  10 mg Oral Daily  . feeding supplement (PRO-STAT SUGAR FREE 64)  30 mL Oral Daily  . ferrous sulfate  325 mg Oral BID WC  . Glycerin (Adult)  1 suppository Rectal BID  . insulin aspart  0-9 Units Subcutaneous TID WC  . insulin aspart  8 Units Subcutaneous TID WC  . insulin detemir  30 Units Subcutaneous q morning - 10a  . levothyroxine  125 mcg Oral Daily  . magnesium  oxide  400 mg Oral Daily  . megestrol  400 mg Oral BID  . metoCLOPramide  5 mg Oral TID AC & HS  . pantoprazole  40 mg Oral Daily  . piperacillin-tazobactam (ZOSYN)  IV  3.375 g Intravenous Q8H  . rosuvastatin  40 mg Oral Daily  . senna-docusate  2 tablet Oral QHS  . sodium chloride  1 g Oral BID WC  . spironolactone  25 mg Oral Daily  . vitamin C  500 mg Oral BID  . Vitamin D (Ergocalciferol)  50,000 Units Oral Q Sat  . zinc sulfate  220 mg Oral Daily   Continuous:  [id number], ondansetron Anti-infectives    Start     Dose/Rate Route Frequency Ordered Stop   02/04/15 2100  vancomycin (VANCOCIN) IVPB 750 mg/150 ml premix  Status:  Discontinued     750 mg 150 mL/hr over 60 Minutes Intravenous Every 24 hours 02/03/15 2049 02/05/15 0853   02/04/15 0200  piperacillin-tazobactam (ZOSYN) IVPB 3.375 g     3.375 g 12.5 mL/hr over 240 Minutes Intravenous Every 8 hours 02/03/15 2049     02/03/15 2200  vancomycin (VANCOCIN) IVPB 1000 mg/200 mL premix     1,000 mg 200 mL/hr over 60 Minutes Intravenous  Once 02/03/15 2049 02/03/15 2320   02/03/15 1845  piperacillin-tazobactam (ZOSYN) IVPB 3.375 g     3.375 g 100 mL/hr over 30 Minutes Intravenous  Once 02/03/15 1833 02/03/15 1920   02/03/15 1845  vancomycin (VANCOCIN) IVPB 1000 mg/200 mL premix  Status:  Discontinued     1,000 mg 200 mL/hr over 60 Minutes Intravenous  Once 02/03/15 1833 02/03/15 1949      Results for orders placed or performed during the hospital encounter of 02/03/15 (from the past 48 hour(s))  Glucose, capillary      Status: Abnormal   Collection Time: 02/04/15 12:21 PM  Result Value Ref Range   Glucose-Capillary 349 (H) 65 - 99 mg/dL  Glucose, capillary     Status: Abnormal   Collection Time: 02/04/15  5:24 PM  Result Value Ref Range   Glucose-Capillary 202 (H) 65 - 99 mg/dL  Glucose, capillary     Status: None   Collection Time: 02/04/15  9:57 PM  Result Value Ref Range   Glucose-Capillary 67 65 - 99 mg/dL   Comment 1 Notify RN    Comment 2 Document in Chart   Glucose, capillary     Status: Abnormal   Collection Time: 02/04/15 10:41 PM  Result Value Ref Range   Glucose-Capillary 140 (H) 65 - 99 mg/dL  Glucose, capillary     Status: Abnormal   Collection Time: 02/05/15  6:53 AM  Result Value Ref Range   Glucose-Capillary 224 (H) 65 - 99 mg/dL   Comment 1 Notify RN    Comment 2 Document in Chart   Glucose, capillary     Status: Abnormal   Collection Time: 02/05/15 11:27 AM  Result Value Ref Range   Glucose-Capillary 328 (H) 65 - 99 mg/dL  Glucose, capillary     Status: Abnormal   Collection Time: 02/05/15  4:45 PM  Result Value Ref Range   Glucose-Capillary 233 (H) 65 - 99 mg/dL  Glucose, capillary     Status: None   Collection Time: 02/05/15  9:26 PM  Result Value Ref Range   Glucose-Capillary 78 65 - 99 mg/dL   Comment 1 Notify RN    Comment 2 Document in Chart   Glucose, capillary  Status: Abnormal   Collection Time: 02/06/15  6:57 AM  Result Value Ref Range   Glucose-Capillary 175 (H) 65 - 99 mg/dL  CBC     Status: Abnormal   Collection Time: 02/06/15  8:25 AM  Result Value Ref Range   WBC 6.2 4.0 - 10.5 K/uL   RBC 3.46 (L) 3.87 - 5.11 MIL/uL   Hemoglobin 10.1 (L) 12.0 - 15.0 g/dL   HCT 30.0 (L) 36.0 - 46.0 %   MCV 86.7 78.0 - 100.0 fL   MCH 29.2 26.0 - 34.0 pg   MCHC 33.7 30.0 - 36.0 g/dL   RDW 14.1 11.5 - 15.5 %   Platelets 422 (H) 150 - 400 K/uL  Basic metabolic panel     Status: Abnormal   Collection Time: 02/06/15  8:25 AM  Result Value Ref Range   Sodium 134  (L) 135 - 145 mmol/L   Potassium 3.5 3.5 - 5.1 mmol/L   Chloride 103 101 - 111 mmol/L   CO2 20 (L) 22 - 32 mmol/L   Glucose, Bld 220 (H) 65 - 99 mg/dL   BUN 17 6 - 20 mg/dL   Creatinine, Ser 1.30 (H) 0.44 - 1.00 mg/dL   Calcium 9.0 8.9 - 10.3 mg/dL   GFR calc non Af Amer 42 (L) >60 mL/min   GFR calc Af Amer 49 (L) >60 mL/min    Comment: (NOTE) The eGFR has been calculated using the CKD EPI equation. This calculation has not been validated in all clinical situations. eGFR's persistently <60 mL/min signify possible Chronic Kidney Disease.    Anion gap 11 5 - 15  Glucose, capillary     Status: Abnormal   Collection Time: 02/06/15 11:06 AM  Result Value Ref Range   Glucose-Capillary 220 (H) 65 - 99 mg/dL    No results found.  Review of Systems  Constitutional: Negative.   HENT: Negative.   Respiratory: Negative.   Cardiovascular: Negative.   Gastrointestinal: Negative.   Genitourinary: Negative.   Musculoskeletal: Negative.   Skin: Negative.        Her primary complaint was the pain in her right buttocks.  Neurological: Negative.   Endo/Heme/Allergies: Negative.   Psychiatric/Behavioral: Negative.    Blood pressure 107/43, pulse 91, temperature 98.8 F (37.1 C), temperature source Oral, resp. rate 20, height _0  (1.575 m), weight 63.2 kg (139 lb 5.3 oz), SpO2 100 %. Physical Exam  Constitutional: She is oriented to person, place, and time. No distress.  Chronically ill woman in no distress  HENT:  Head: Normocephalic and atraumatic.  Nose: Nose normal.  Eyes: Right eye exhibits no discharge. Left eye exhibits no discharge. No scleral icterus.  Neck: Normal range of motion. Neck supple. No JVD present. No tracheal deviation present. No thyromegaly present.  Cardiovascular: Normal rate, regular rhythm, normal heart sounds and intact distal pulses.   Respiratory: Effort normal and breath sounds normal. No respiratory distress. She has no wheezes. She has no rales. She  exhibits no tenderness.  GI: Soft. Bowel sounds are normal. She exhibits no distension and no mass. There is no tenderness. There is no rebound and no guarding.  Genitourinary: Vagina normal.  Musculoskeletal: She exhibits no edema or tenderness.  Lymphadenopathy:    She has no cervical adenopathy.  Neurological: She is alert and oriented to person, place, and time. No cranial nerve deficit.  Skin: Skin is warm and dry. Rash noted. She is not diaphoretic. There is erythema.     Palmar skin  loss, from Macrobid reaction Candida like left upper thigh ulcerated skin infection   Psychiatric: She has a normal mood and affect. Her behavior is normal. Judgment and thought content normal.   6 o'clock is the pointing toward the rectum  Some tracking at the 11 o'clock position, some undermining of the wound.  Wound is 2.5 cm deep 2 cm long, tracking noted about 2 cm.    Assessment/Plan: Right buttocks abscess Recent left him arthroplasty 11/21/14 AODM  Poor control A1C 8.6 CAD/Sp CABG, Hx of CHF Hypertension UTI with significant reaction to Macrobid Hx of pneumonia Hypothyroid   Plan:  I have irrigated, cultured the site and redressed the site.  I have evaluated it along with Dr.lngram in attendance who also looked the site.  I talked with Dr.Rai, she will increase her to Gm positive coverage, she is being treated already for the skin candida appearing site on  her left thigh.  I have left instructions for dressing change and she can shower from our standpoint, and have dressing changes after that.  We will follow with you.   Jayr Lupercio 02/06/2015, 12:07 PM

## 2015-02-06 NOTE — Progress Notes (Signed)
[facility]                                                                              Patient Demographics  Bridget Martinez, is a 64 y.o. female, DOB - Feb 03, 1951, [id number]  Admit date - 02/03/2015   Admitting Physician [name], MD  Outpatient Primary MD for the patient is [name], [place]  LOS - 3   Chief Complaint  Patient presents with  . Pressure Ulcer       Brief HPI  64 yo female with  HTN, DM, HLD, CAD h/o CABG, CHF, CKD, Hypothyroidism, presented to [facility] ED with main concern of one week duration of draining ulcer in the right buttock area that has been draining with foul odor.  In ED, patient was noted to have right gluteal abscess and had I and D done in ED. No cultures noted. Patient was placed on IV vancomycin and Zosyn and hospitalist service requested for admission     Assessment & Plan    Principal Problem:   Abscess, gluteal, right - I&D was done in the ED with packing however no cultures are available - Patient was placed on IV vancomycin and Zosyn, vancomycin was discontinued on 5/29 - Wound inspected, still has pus draining, I have requested a surgery consult for recommendations    Active Problems:   Hypertension - BP currently stable, borderline soft, monitor closely   Mild acute on CKD (chronic kidney disease), stage III - Improving - Currently at baseline, 1.5    DM (diabetes mellitus), type 1, uncontrolled w/neurologic complication - CBGs controlled, continue Lantus, sliding scale insulin    Chronic combined systolic and diastolic congestive heart failure, pulmonary hypertension - Continue beta blocker, aspirin, spironolactone,    Hypothyroidism - Continue Synthroid  Code Status: Full code  Family Communication: Discussed in detail with the patient, all imaging results, lab results explained to the patient    Disposition Plan: Pending surgery evaluation  Time Spent in minutes   25  minutes  Procedures  I & D in ED   Consults   surgery  DVT Prophylaxis  Lovenox   Medications  Scheduled Meds: . aspirin EC  81 mg Oral Daily  . carvedilol  3.125 mg Oral BID WC  . enoxaparin (LOVENOX) injection  30 mg Subcutaneous Q24H  . escitalopram  10 mg Oral Daily  . feeding supplement (PRO-STAT SUGAR FREE 64)  30 mL Oral Daily  . ferrous sulfate  325 mg Oral BID WC  . Glycerin (Adult)  1 suppository Rectal BID  . insulin aspart  0-9 Units Subcutaneous TID WC  . insulin aspart  8 Units Subcutaneous TID WC  . insulin detemir  30 Units Subcutaneous q morning - 10a  . levothyroxine  125 mcg Oral Daily  . magnesium oxide  400 mg Oral Daily  . megestrol  400 mg Oral BID  . metoCLOPramide  5 mg Oral TID AC & HS  . pantoprazole  40 mg Oral Daily  . piperacillin-tazobactam (ZOSYN)  IV  3.375 g Intravenous Q8H  . rosuvastatin  40 mg Oral Daily  . senna-docusate  2 tablet Oral  QHS  . sodium chloride  1 g Oral BID WC  . spironolactone  25 mg Oral Daily  . vitamin C  500 mg Oral BID  . Vitamin D (Ergocalciferol)  50,000 Units Oral Q Sat  . zinc sulfate  220 mg Oral Daily   Continuous Infusions:  PRN Meds:.HYDROcodone-acetaminophen, ondansetron   Antibiotics   Anti-infectives    Start     Dose/Rate Route Frequency Ordered Stop   02/04/15 2100  vancomycin (VANCOCIN) IVPB 750 mg/150 ml premix  Status:  Discontinued     750 mg 150 mL/hr over 60 Minutes Intravenous Every 24 hours 02/03/15 2049 02/05/15 0853   02/04/15 0200  piperacillin-tazobactam (ZOSYN) IVPB 3.375 g     3.375 g 12.5 mL/hr over 240 Minutes Intravenous Every 8 hours 02/03/15 2049     02/03/15 2200  vancomycin (VANCOCIN) IVPB 1000 mg/200 mL premix     1,000 mg 200 mL/hr over 60 Minutes Intravenous  Once 02/03/15 2049 02/03/15 2320   02/03/15 1845  piperacillin-tazobactam (ZOSYN) IVPB 3.375 g     3.375 g 100 mL/hr over 30 Minutes Intravenous  Once 02/03/15 1833 02/03/15 1920   02/03/15 1845  vancomycin  (VANCOCIN) IVPB 1000 mg/200 mL premix  Status:  Discontinued     1,000 mg 200 mL/hr over 60 Minutes Intravenous  Once 02/03/15 1833 02/03/15 1949        Subjective:   Bridget Martinez was seen and examined today.  Patient denies dizziness, chest pain, shortness of breath, abdominal pain, N/V/D/C, new weakness, numbess, tingling. No acute events overnight.    Objective:   Blood pressure 107/43, pulse 91, temperature 98.8 F (37.1 C), temperature source Oral, resp. rate 20, height 5\' 2"  (1.575 m), weight 63.2 kg (139 lb 5.3 oz), SpO2 100 %.  Wt Readings from Last 3 Encounters:  02/03/15 63.2 kg (139 lb 5.3 oz)  12/28/14 60.102 kg (132 lb 8 oz)  11/28/14 64.229 kg (141 lb 9.6 oz)     Intake/Output Summary (Last 24 hours) at 02/06/15 0956 Last data filed at 02/06/15 0603  Gross per 24 hour  Intake      0 ml  Output    450 ml  Net   -450 ml    Exam  General: Alert and oriented x 3, NAD  HEENT:  PERRLA, EOMI, Anicteic Sclera, mucous membranes moist.   Neck: Supple, no JVD, no masses  CVS: S1 S2 auscultated, no rubs, murmurs or gallops. Regular rate and rhythm.  Respiratory: Clear to auscultation bilaterally, no wheezing, rales or rhonchi  Abdomen: Soft, nontender, nondistended, + bowel sounds  Ext: no cyanosis clubbing or edema  Neuro: AAOx3, Cr N's II- XII. Strength 5/5 upper and lower extremities bilaterally  Skin: Right buttock abscess, still draining pus on squeezing, packing +  Psych: Normal affect and demeanor, alert and oriented x3    Data Review   Micro Results Recent Results (from the past 240 hour(s))  Urine culture     Status: None (Preliminary result)   Collection Time: 02/04/15 11:18 AM  Result Value Ref Range Status   Specimen Description URINE, CATHETERIZED  Final   Special Requests NONE  Final   Colony Count   Final    25,000 COLONIES/ML Performed at [facility]    Culture   Final    ESCHERICHIA COLI Performed at [facility]    Report Status PENDING  Incomplete    Radiology Reports No results found.  CBC  Recent Labs Lab  02/03/15 1555 02/04/15 0523 02/06/15 0825  WBC 10.7* 8.5 6.2  HGB 10.2* 9.3* 10.1*  HCT 29.7* 27.7* 30.0*  PLT 464* 395 422*  MCV 85.1 85.8 86.7  MCH 29.2 28.8 29.2  MCHC 34.3 33.6 33.7  RDW 13.8 14.0 14.1  LYMPHSABS 1.2  --   --   MONOABS 1.2*  --   --   EOSABS 0.1  --   --   BASOSABS 0.0  --   --     Chemistries   Recent Labs Lab 02/03/15 1555 02/04/15 0523 02/06/15 0825  NA 134* 133* 134*  K 3.4* 3.8 3.5  CL 104 105 103  CO2 20* 16* 20*  GLUCOSE 279* 451* 220*  BUN 33* 30* 17  CREATININE 1.92* 1.71* 1.30*  CALCIUM 9.9 9.1 9.0  AST 12*  --   --   ALT 13*  --   --   ALKPHOS 149*  --   --   BILITOT 0.7  --   --    ------------------------------------------------------------------------------------------------------------------ estimated creatinine clearance is 38.2 mL/min (by C-G formula based on Cr of 1.3). ------------------------------------------------------------------------------------------------------------------ No results for input(s): HGBA1C in the last 72 hours. ------------------------------------------------------------------------------------------------------------------ No results for input(s): CHOL, HDL, LDLCALC, TRIG, CHOLHDL, LDLDIRECT in the last 72 hours. ------------------------------------------------------------------------------------------------------------------ No results for input(s): TSH, T4TOTAL, T3FREE, THYROIDAB in the last 72 hours.  Invalid input(s): FREET3 ------------------------------------------------------------------------------------------------------------------ No results for input(s): VITAMINB12, FOLATE, FERRITIN, TIBC, IRON, RETICCTPCT in the last 72 hours.  Coagulation profile No results for input(s): INR, PROTIME in the last 168 hours.  No results for input(s): DDIMER in the last 72  hours.  Cardiac Enzymes No results for input(s): CKMB, TROPONINI, MYOGLOBIN in the last 168 hours.  Invalid input(s): CK ------------------------------------------------------------------------------------------------------------------ Invalid input(s): [place]  02/04/15 2241 02/05/15 0653 02/05/15 1127 02/05/15 1645 02/05/15 2126 02/06/15 0657  GLUCAP 140* 224* 328* 233* 78 175*     Haston Casebolt M.D. [facility] 02/06/2015, 9:56 AM  Pager: [phone]   Between 7am to 7pm - call Pager - [phone]  After 7pm go to [email or web address] - password [email or web address]  Call night coverage person covering after 7pm

## 2015-02-06 NOTE — Progress Notes (Signed)
Inpatient Diabetes Program Recommendations  AACE/ADA: New Consensus Statement on Inpatient Glycemic Control (2013)  Target Ranges:  Prepandial:   less than 140 mg/dL      Peak postprandial:   less than 180 mg/dL (1-2 hours)      Critically ill patients:  140 - 180 mg/dL   Fasting glucose tends to be the highest cbg of the day-into 300's and 400's. Pt then needing high doses of correction insulin which tends to result in hypoglycemia mid-day. Increase in basal insulin is helpful in normalizing fasting glucose levels  Inpatient Diabetes Program Recommendations Insulin - Basal: Please consider increase in levemir to 35 units as fasting glucose levels are high requiring higher doses of correction which may tend to 'stack' with the CKD. May be helpful to attain fasting cbg's in the 100's range.  Thank you Rosita Kea, RN, MSN, CDE  Diabetes Inpatient Program Office: [phone] Pager: [phone] 8:00 am to 5:00 pm

## 2015-02-07 ENCOUNTER — Inpatient Hospital Stay (HOSPITAL_COMMUNITY): Admission: RE | Admit: 2015-02-07 | Payer: BC Managed Care – PPO | Source: Ambulatory Visit

## 2015-02-07 LAB — BASIC METABOLIC PANEL
Anion gap: 13 (ref 5–15)
BUN: 21 mg/dL — AB (ref 6–20)
CHLORIDE: 100 mmol/L — AB (ref 101–111)
CO2: 20 mmol/L — ABNORMAL LOW (ref 22–32)
Calcium: 8.9 mg/dL (ref 8.9–10.3)
Creatinine, Ser: 1.17 mg/dL — ABNORMAL HIGH (ref 0.44–1.00)
GFR, EST AFRICAN AMERICAN: 56 mL/min — AB (ref >60)
GFR, EST NON AFRICAN AMERICAN: 48 mL/min — AB (ref >60)
GLUCOSE: 409 mg/dL — AB (ref 65–99)
Potassium: 3.9 mmol/L (ref 3.5–5.1)
Sodium: 133 mmol/L — ABNORMAL LOW (ref 135–145)

## 2015-02-07 LAB — GLUCOSE, CAPILLARY
GLUCOSE-CAPILLARY: 365 mg/dL — AB (ref 65–99)
GLUCOSE-CAPILLARY: 345 mg/dL — AB (ref 65–99)
GLUCOSE-CAPILLARY: 170 mg/dL — AB (ref 65–99)
Glucose-Capillary: 244 mg/dL — ABNORMAL HIGH (ref 65–99)
Glucose-Capillary: 181 mg/dL — ABNORMAL HIGH (ref 65–99)

## 2015-02-07 LAB — HEMOGLOBIN A1C
Hgb A1c MFr Bld: 8.9 % — ABNORMAL HIGH (ref 4.8–5.6)
MEAN PLASMA GLUCOSE: 209 mg/dL

## 2015-02-07 MED ORDER — INSULIN DETEMIR 100 UNIT/ML ~~LOC~~ SOLN
35.0000 [IU] | Freq: Every morning | SUBCUTANEOUS | Status: DC
  Administered 2015-02-08 – 2015-02-09 (×2): 35 [IU] via SUBCUTANEOUS
  Filled 2015-02-07 (×2): qty 0.35

## 2015-02-07 MED ORDER — INSULIN ASPART 100 UNIT/ML ~~LOC~~ SOLN
0.0000 [IU] | Freq: Three times a day (TID) | SUBCUTANEOUS | Status: DC
Start: 2015-02-07 — End: 2015-02-09
  Administered 2015-02-07: 5 [IU] via SUBCUTANEOUS
  Administered 2015-02-07 – 2015-02-08 (×2): 11 [IU] via SUBCUTANEOUS
  Administered 2015-02-08: 5 [IU] via SUBCUTANEOUS
  Administered 2015-02-08: 3 [IU] via SUBCUTANEOUS
  Administered 2015-02-09: 11 [IU] via SUBCUTANEOUS
  Administered 2015-02-09: 8 [IU] via SUBCUTANEOUS

## 2015-02-07 MED ORDER — INSULIN ASPART 100 UNIT/ML ~~LOC~~ SOLN: 0.0000 [IU] | Freq: Every day | SUBCUTANEOUS | Status: DC

## 2015-02-07 MED ORDER — VANCOMYCIN HCL IN DEXTROSE 1-5 GM/200ML-% IV SOLN
1000.0000 mg | INTRAVENOUS | Status: DC
  Administered 2015-02-07: 1000 mg via INTRAVENOUS
  Filled 2015-02-07 (×3): qty 200

## 2015-02-07 MED ORDER — INSULIN DETEMIR 100 UNIT/ML ~~LOC~~ SOLN
5.0000 [IU] | Freq: Once | SUBCUTANEOUS | Status: DC
  Filled 2015-02-07 (×2): qty 0.05

## 2015-02-07 MED ORDER — INSULIN ASPART 100 UNIT/ML ~~LOC~~ SOLN
10.0000 [IU] | Freq: Three times a day (TID) | SUBCUTANEOUS | Status: DC
  Administered 2015-02-07 – 2015-02-09 (×7): 10 [IU] via SUBCUTANEOUS

## 2015-02-07 NOTE — Discharge Instructions (Signed)
Dressing Change °A dressing is a material placed over wounds. It keeps the wound clean, dry, and protected from further injury. This provides an environment that favors wound healing.  °BEFORE YOU BEGIN °· Get your supplies together. Things you may need include: °¨ Saline solution. °¨ Flexible gauze dressing. °¨ Medicated cream. °¨ Tape. °¨ Gloves. °¨ Abdominal dressing pads. °¨ Gauze squares. °¨ Plastic bags. °· Take pain medicine 30 minutes before the dressing change if you need it. °· Take a shower before you do the first dressing change of the day. Use plastic wrap or a plastic bag to prevent the dressing from getting wet. °REMOVING YOUR OLD DRESSING  °· Wash your hands with soap and water. Dry your hands with a clean towel. °· Put on your gloves. °· Remove any tape. °· Carefully remove the old dressing. If the dressing sticks, you may dampen it with warm water to loosen it, or follow your caregiver's specific directions. °· Remove any gauze or packing tape that is in your wound. °· Take off your gloves. °· Put the gloves, tape, gauze, or any packing tape into a plastic bag. °CHANGING YOUR DRESSING °· Open the supplies. °· Take the cap off the saline solution. °· Open the gauze package so that the gauze remains on the inside of the package. °· Put on your gloves. °· Clean your wound as told by your caregiver. °· If you have been told to keep your wound dry, follow those instructions. °· Your caregiver may tell you to do one or more of the following: °¨ Pick up the gauze. Pour the saline solution over the gauze. Squeeze out the extra saline solution. °¨ Put medicated cream or other medicine on your wound if you have been told to do so. °¨ Put the solution soaked gauze only in your wound, not on the skin around it. °¨ Pack your wound loosely or as told by your caregiver. °¨ Put dry gauze on your wound. °¨ Put abdominal dressing pads over the dry gauze if your wet gauze soaks through. °· Tape the abdominal dressing  pads in place so they will not fall off. Do not wrap the tape completely around the affected part (arm, leg, abdomen). °· Wrap the dressing pads with a flexible gauze dressing to secure it in place. °· Take off your gloves. Put them in the plastic bag with the old dressing. Tie the bag shut and throw it away. °· Keep the dressing clean and dry until your next dressing change. °· Wash your hands. °SEEK MEDICAL CARE IF: °· Your skin around the wound looks red. °· Your wound feels more tender or sore. °· You see pus in the wound. °· Your wound smells bad. °· You have a fever. °· Your skin around the wound has a rash that itches and burns. °· You see black or yellow skin in your wound that was not there before. °· You feel nauseous, throw up, and feel very tired. °Document Released: 10/03/2004 Document Revised: 11/18/2011 Document Reviewed: 07/08/2011 °ExitCare® Patient Information ©2015 [employer]. This information is not intended to replace advice given to you by your health care provider. Make sure you discuss any questions you have with your health care provider. ° °

## 2015-02-07 NOTE — Care Management Note (Signed)
Case Management Note  Patient Details  Name: MICAELLA GITTO MRN: [id number] Date of Birth: [birth year]  Subjective/Objective:                    Action/Plan:  Met with patient to discuss discharge planning. Patient was active with [facility] prior to admission.  She was receiving [facility] PT/OT/RN services and would like all 3 resumed at discharge.  CM spoke with Miranda with [facility], who has accepted the referral.  CM verified that patient WILL have someone at home to assist with dressing changes. She is aware that [facility] will not be able to provide a nurse for each dressing change. Expected Discharge Date:                  Expected Discharge Plan:  [place]  In-House Referral:     Discharge planning Services  CM Consult  Post Acute Care Choice:    Choice offered to:  Patient  DME Arranged:    DME Agency:     HH Arranged:  RN, PT, OT [place] Agency:  [place]  Status of Service:     Medicare Important Message Given:    Date Medicare IM Given:    Medicare IM give by:    Date Additional Medicare IM Given:    Additional Medicare Important Message give by:     If discussed at [place] of Stay Meetings, dates discussed:    Additional Comments:  Rolm Baptise, RN 02/07/2015, 2:24 PM

## 2015-02-07 NOTE — Progress Notes (Signed)
Patient ID: Bridget Martinez, female   DOB: 01/06/1951, 64 y.o.   MRN: [id number]    Subjective: No complaints.  Tolerating dressing changes.  Objective: Vital signs in last 24 hours: Temp:  [98 F (36.7 C)-98.8 F (37.1 C)] 98.2 F (36.8 C) (05/31 0918) Pulse Rate:  [81-98] 98 (05/31 0918) Resp:  [17-20] 20 (05/31 0918) BP: (106-121)/(33-85) 113/34 mmHg (05/31 0918) SpO2:  [97 %-100 %] 97 % (05/31 0918) Last BM Date: 02/05/15  Intake/Output from previous day: 05/30 0701 - 05/31 0700 In: 120 [P.O.:120] Out: 1000 [Urine:1000] Intake/Output this shift:    PE: Skin: buttock wound is clean and packed.  Some fibrinous tissue noted, but overall improved with no further purulent drainage  Lab Results:   Recent Labs  02/06/15 0825  WBC 6.2  HGB 10.1*  HCT 30.0*  PLT 422*   BMET  Recent Labs  02/06/15 0825 02/07/15 0645  NA 134* 133*  K 3.5 3.9  CL 103 100*  CO2 20* 20*  GLUCOSE 220* 409*  BUN 17 21*  CREATININE 1.30* 1.17*  CALCIUM 9.0 8.9   PT/INR No results for input(s): LABPROT, INR in the last 72 hours. CMP     Component Value Date/Time   NA 133* 02/07/2015 0645   NA 136 11/10/2013 1404   K 3.9 02/07/2015 0645   CL 100* 02/07/2015 0645   CO2 20* 02/07/2015 0645   GLUCOSE 409* 02/07/2015 0645   GLUCOSE 516* 11/10/2013 1404   BUN 21* 02/07/2015 0645   BUN 25 11/10/2013 1404   CREATININE 1.17* 02/07/2015 0645   CALCIUM 8.9 02/07/2015 0645   PROT 7.3 02/03/2015 1555   PROT 6.1 11/10/2013 1404   ALBUMIN 3.5 02/03/2015 1555   AST 12* 02/03/2015 1555   ALT 13* 02/03/2015 1555   ALKPHOS 149* 02/03/2015 1555   BILITOT 0.7 02/03/2015 1555   GFRNONAA 48* 02/07/2015 0645   GFRAA 56* 02/07/2015 0645   Lipase     Component Value Date/Time   LIPASE 117* 10/28/2014 1400       Studies/Results: No results found.  Anti-infectives: Anti-infectives    Start     Dose/Rate Route Frequency Ordered Stop   02/07/15 1500  vancomycin (VANCOCIN) IVPB 1000  mg/200 mL premix     1,000 mg 200 mL/hr over 60 Minutes Intravenous Every 24 hours 02/07/15 0917     02/06/15 1500  vancomycin (VANCOCIN) IVPB 750 mg/150 ml premix  Status:  Discontinued     750 mg 150 mL/hr over 60 Minutes Intravenous Every 24 hours 02/06/15 1340 02/07/15 0917   02/04/15 2100  vancomycin (VANCOCIN) IVPB 750 mg/150 ml premix  Status:  Discontinued     750 mg 150 mL/hr over 60 Minutes Intravenous Every 24 hours 02/03/15 2049 02/05/15 0853   02/04/15 0200  piperacillin-tazobactam (ZOSYN) IVPB 3.375 g     3.375 g 12.5 mL/hr over 240 Minutes Intravenous Every 8 hours 02/03/15 2049     02/03/15 2200  vancomycin (VANCOCIN) IVPB 1000 mg/200 mL premix     1,000 mg 200 mL/hr over 60 Minutes Intravenous  Once 02/03/15 2049 02/03/15 2320   02/03/15 1845  piperacillin-tazobactam (ZOSYN) IVPB 3.375 g     3.375 g 100 mL/hr over 30 Minutes Intravenous  Once 02/03/15 1833 02/03/15 1920   02/03/15 1845  vancomycin (VANCOCIN) IVPB 1000 mg/200 mL premix  Status:  Discontinued     1,000 mg 200 mL/hr over 60 Minutes Intravenous  Once 02/03/15 1833 02/03/15 1949  Assessment/Plan  1. S/p I&D of buttock abscess by ED -patient is surgically stable for dc home -would treat with 5-7 days of abx therapy, CX prelim shows some gram + cocci, so would cover with Bactrim or doxy -will arrange [facility] for dressing changes -otherwise will have her follow up as an outpatient for wound check  LOS: 4 days    Apolonio Cutting E 02/07/2015, 9:28 AM Pager: [phone]

## 2015-02-07 NOTE — Progress Notes (Signed)
[facility]                                                                              Patient Demographics  Bridget Martinez, is a 64 y.o. female, DOB - 07-17-1951, [id number]  Admit date - 02/03/2015   Admitting Physician [name], MD  Outpatient Primary MD for the patient is Bridget Martinez, [place]  LOS - 4   Chief Complaint  Patient presents with  . Pressure Ulcer       Brief HPI  64 yo female with  HTN, DM, HLD, CAD h/o CABG, CHF, CKD, Hypothyroidism, presented to [facility] ED with main concern of one week duration of draining ulcer in the right buttock area that has been draining with foul odor.  In ED, patient was noted to have right gluteal abscess and had I and D done in ED. No cultures noted. Patient was placed on IV vancomycin and Zosyn and hospitalist service requested for admission     Assessment & Plan    Principal Problem:   Abscess, gluteal, right - I&D was done in the ED with packing however no cultures sent - Patient was placed on IV vancomycin and Zosyn, vancomycin was discontinued on 5/29, restarted on 5/30 - Appreciate general surgery recommendations, will continue dressing changes - Cultures growing GPC, follow sensitivities  Active Problems:   Hypertension - BP currently stable, borderline soft, monitor closely   Mild acute on CKD (chronic kidney disease), stage III - Improving - Currently at baseline, 1.5    DM (diabetes mellitus), type 1, uncontrolled w/neurologic complication - CBGs controlled, continue Lantus, sliding scale insulin    Chronic combined systolic and diastolic congestive heart failure, pulmonary hypertension - Continue beta blocker, aspirin, spironolactone,    Hypothyroidism - Continue Synthroid  Candida/Yeast infection the left upper thigh skin, palmar skin loss - Per patient, she had a reaction to Macrobid, continue nystatin  UTI Urine culture showed only 25,000 colonies of Escherichia coli,  afebrile, no dysuria  Code Status: Full code  Family Communication: Discussed in detail with the patient, all imaging results, lab results explained to the patient    Disposition Plan:  Hopefully next 24-48 hours, will arrange home health RN for dressing changes  Time Spent in minutes   25 minutes  Procedures  I & D in ED   Consults   surgery  DVT Prophylaxis  Lovenox   Medications  Scheduled Meds: . aspirin EC  81 mg Oral Daily  . carvedilol  3.125 mg Oral BID WC  . enoxaparin (LOVENOX) injection  40 mg Subcutaneous Q24H  . escitalopram  10 mg Oral Daily  . feeding supplement (PRO-STAT SUGAR FREE 64)  30 mL Oral Daily  . ferrous sulfate  325 mg Oral BID WC  . Glycerin (Adult)  1 suppository Rectal BID  . insulin aspart  0-15 Units Subcutaneous TID WC  . insulin aspart  0-5 Units Subcutaneous QHS  . insulin aspart  10 Units Subcutaneous TID WC  . [START ON 02/08/2015] insulin detemir  35 Units Subcutaneous q morning - 10a  . insulin detemir  5 Units Subcutaneous Once  . levothyroxine  125 mcg Oral Daily  . magnesium oxide  400 mg Oral Daily  . megestrol  400 mg Oral BID  . metoCLOPramide  5 mg Oral TID AC & HS  . nystatin cream   Topical BID  . pantoprazole  40 mg Oral Daily  . piperacillin-tazobactam (ZOSYN)  IV  3.375 g Intravenous Q8H  . rosuvastatin  40 mg Oral Daily  . senna-docusate  2 tablet Oral QHS  . sodium chloride  1 g Oral BID WC  . spironolactone  25 mg Oral Daily  . vancomycin  1,000 mg Intravenous Q24H  . vitamin C  500 mg Oral BID  . Vitamin D (Ergocalciferol)  50,000 Units Oral Q Sat  . zinc sulfate  220 mg Oral Daily   Continuous Infusions:  PRN Meds:.HYDROcodone-acetaminophen, ondansetron   Antibiotics   Anti-infectives    Start     Dose/Rate Route Frequency Ordered Stop   02/07/15 1500  vancomycin (VANCOCIN) IVPB 1000 mg/200 mL premix     1,000 mg 200 mL/hr over 60 Minutes Intravenous Every 24 hours 02/07/15 0917     02/06/15 1500   vancomycin (VANCOCIN) IVPB 750 mg/150 ml premix  Status:  Discontinued     750 mg 150 mL/hr over 60 Minutes Intravenous Every 24 hours 02/06/15 1340 02/07/15 0917   02/04/15 2100  vancomycin (VANCOCIN) IVPB 750 mg/150 ml premix  Status:  Discontinued     750 mg 150 mL/hr over 60 Minutes Intravenous Every 24 hours 02/03/15 2049 02/05/15 0853   02/04/15 0200  piperacillin-tazobactam (ZOSYN) IVPB 3.375 g     3.375 g 12.5 mL/hr over 240 Minutes Intravenous Every 8 hours 02/03/15 2049     02/03/15 2200  vancomycin (VANCOCIN) IVPB 1000 mg/200 mL premix     1,000 mg 200 mL/hr over 60 Minutes Intravenous  Once 02/03/15 2049 02/03/15 2320   02/03/15 1845  piperacillin-tazobactam (ZOSYN) IVPB 3.375 g     3.375 g 100 mL/hr over 30 Minutes Intravenous  Once 02/03/15 1833 02/03/15 1920   02/03/15 1845  vancomycin (VANCOCIN) IVPB 1000 mg/200 mL premix  Status:  Discontinued     1,000 mg 200 mL/hr over 60 Minutes Intravenous  Once 02/03/15 1833 02/03/15 1949        Subjective:   [name] was seen and examined today. Feeling better today, gluteal wound is improving.  Patient denies dizziness, chest pain, shortness of breath, abdominal pain, N/V/D/C, new weakness, numbess, tingling. No acute events overnight.    Objective:   Blood pressure 113/34, pulse 98, temperature 98.2 F (36.8 C), temperature source Oral, resp. rate 20, height 5\' 2"  (1.575 m), weight 63.2 kg (139 lb 5.3 oz), SpO2 97 %.  Wt Readings from Last 3 Encounters:  02/03/15 63.2 kg (139 lb 5.3 oz)  12/28/14 60.102 kg (132 lb 8 oz)  11/28/14 64.229 kg (141 lb 9.6 oz)     Intake/Output Summary (Last 24 hours) at 02/07/15 1113 Last data filed at 02/07/15 0540  Gross per 24 hour  Intake    120 ml  Output   1000 ml  Net   -880 ml    Exam  General: Alert and oriented x 3, NAD  HEENT:  PERRLA, EOMI,   Neck: Supple, no JVD, no masses  CVS: S1 S2 clear, RRR  Respiratory: CTAB  Abdomen: Soft, nontender,  nondistended, + bowel sounds  Ext: no cyanosis clubbing or edema  Neuro: AAOx3, Cr N's II- XII. Strength 5/5 upper and lower extremities bilaterally  Skin:  Right buttock abscess, packing, no draining  Psych: Normal affect and demeanor, alert and oriented x3    Data Review   Micro Results Recent Results (from the past 240 hour(s))  Urine culture     Status: None   Collection Time: 02/04/15 11:18 AM  Result Value Ref Range Status   Specimen Description URINE, CATHETERIZED  Final   Special Requests NONE  Final   Colony Count   Final    25,000 COLONIES/ML Performed at [facility]    Culture   Final    ESCHERICHIA COLI Performed at [facility]    Report Status 02/06/2015 FINAL  Final   Organism ID, Bacteria ESCHERICHIA COLI  Final      Susceptibility   Escherichia coli - MIC*    AMPICILLIN >=32 RESISTANT Resistant     CEFAZOLIN >=64 RESISTANT Resistant     CEFTRIAXONE <=1 SENSITIVE Sensitive     CIPROFLOXACIN >=4 RESISTANT Resistant     GENTAMICIN >=16 RESISTANT Resistant     LEVOFLOXACIN >=8 RESISTANT Resistant     NITROFURANTOIN <=16 SENSITIVE Sensitive     TOBRAMYCIN 8 INTERMEDIATE Intermediate     TRIMETH/SULFA >=320 RESISTANT Resistant     PIP/TAZO >=128 RESISTANT Resistant     * ESCHERICHIA COLI  Wound culture     Status: None (Preliminary result)   Collection Time: 02/06/15  1:30 PM  Result Value Ref Range Status   Specimen Description WOUND RIGHT BUTTOCKS  Final   Special Requests Normal  Final   Gram Stain   Final    ABUNDANT WBC PRESENT, PREDOMINANTLY PMN NO SQUAMOUS EPITHELIAL CELLS SEEN FEW GRAM POSITIVE COCCI IN CLUSTERS Performed at [facility]    Culture   Final    Culture reincubated for better growth Performed at [facility]    Report Status PENDING  Incomplete    Radiology Reports No results found.  CBC  Recent Labs Lab 02/03/15 1555 02/04/15 0523 02/06/15 0825  WBC 10.7* 8.5 6.2  HGB 10.2* 9.3*  10.1*  HCT 29.7* 27.7* 30.0*  PLT 464* 395 422*  MCV 85.1 85.8 86.7  MCH 29.2 28.8 29.2  MCHC 34.3 33.6 33.7  RDW 13.8 14.0 14.1  LYMPHSABS 1.2  --   --   MONOABS 1.2*  --   --   EOSABS 0.1  --   --   BASOSABS 0.0  --   --     Chemistries   Recent Labs Lab 02/03/15 1555 02/04/15 0523 02/06/15 0825 02/07/15 0645  NA 134* 133* 134* 133*  K 3.4* 3.8 3.5 3.9  CL 104 105 103 100*  CO2 20* 16* 20* 20*  GLUCOSE 279* 451* 220* 409*  BUN 33* 30* 17 21*  CREATININE 1.92* 1.71* 1.30* 1.17*  CALCIUM 9.9 9.1 9.0 8.9  AST 12*  --   --   --   ALT 13*  --   --   --   ALKPHOS 149*  --   --   --   BILITOT 0.7  --   --   --    ------------------------------------------------------------------------------------------------------------------ estimated creatinine clearance is 42.4 mL/min (by C-G formula based on Cr of 1.17). ------------------------------------------------------------------------------------------------------------------ No results for input(s): HGBA1C in the last 72 hours. ------------------------------------------------------------------------------------------------------------------ No results for input(s): CHOL, HDL, LDLCALC, TRIG, CHOLHDL, LDLDIRECT in the last 72 hours. ------------------------------------------------------------------------------------------------------------------ No results for input(s): TSH, T4TOTAL, T3FREE, THYROIDAB in the last 72 hours.  Invalid input(s): FREET3 ------------------------------------------------------------------------------------------------------------------ No results for input(s): VITAMINB12, FOLATE, FERRITIN, TIBC,  IRON, RETICCTPCT in the last 72 hours.  Coagulation profile No results for input(s): INR, PROTIME in the last 168 hours.  No results for input(s): DDIMER in the last 72 hours.  Cardiac Enzymes No results for input(s): CKMB, TROPONINI, MYOGLOBIN in the last 168 hours.  Invalid input(s):  CK ------------------------------------------------------------------------------------------------------------------ Invalid input(s): [place]  02/05/15 2126 02/06/15 0657 02/06/15 1106 02/06/15 1701 02/06/15 2150 02/07/15 0649  GLUCAP 78 175* 220* 141* 167* 365*     Arriona Prest M.D. [facility] 02/07/2015, 11:13 AM  Pager: [phone]   Between 7am to 7pm - call Pager - [phone]  After 7pm go to [email or web address] - password [email or web address]  Call night coverage person covering after 7pm

## 2015-02-08 DIAGNOSIS — N183 Chronic kidney disease, stage 3 (moderate): Secondary | ICD-10-CM

## 2015-02-08 DIAGNOSIS — E1041 Type 1 diabetes mellitus with diabetic mononeuropathy: Secondary | ICD-10-CM

## 2015-02-08 DIAGNOSIS — I10 Essential (primary) hypertension: Secondary | ICD-10-CM

## 2015-02-08 LAB — BASIC METABOLIC PANEL
ANION GAP: 12 (ref 5–15)
BUN: 22 mg/dL — ABNORMAL HIGH (ref 6–20)
CO2: 22 mmol/L (ref 22–32)
Calcium: 9.1 mg/dL (ref 8.9–10.3)
Chloride: 99 mmol/L — ABNORMAL LOW (ref 101–111)
Creatinine, Ser: 1.12 mg/dL — ABNORMAL HIGH (ref 0.44–1.00)
GFR calc Af Amer: 59 mL/min — ABNORMAL LOW (ref >60)
GFR calc non Af Amer: 51 mL/min — ABNORMAL LOW (ref >60)
Glucose, Bld: 315 mg/dL — ABNORMAL HIGH (ref 65–99)
POTASSIUM: 4 mmol/L (ref 3.5–5.1)
SODIUM: 133 mmol/L — AB (ref 135–145)

## 2015-02-08 LAB — GLUCOSE, CAPILLARY
GLUCOSE-CAPILLARY: 216 mg/dL — AB (ref 65–99)
Glucose-Capillary: 260 mg/dL — ABNORMAL HIGH (ref 65–99)
Glucose-Capillary: 328 mg/dL — ABNORMAL HIGH (ref 65–99)
Glucose-Capillary: 178 mg/dL — ABNORMAL HIGH (ref 65–99)
Glucose-Capillary: 139 mg/dL — ABNORMAL HIGH (ref 65–99)

## 2015-02-08 MED ORDER — DOXYCYCLINE HYCLATE 100 MG PO TABS
100.0000 mg | ORAL_TABLET | Freq: Two times a day (BID) | ORAL | Status: DC
  Administered 2015-02-08 – 2015-02-09 (×3): 100 mg via ORAL
  Filled 2015-02-08 (×3): qty 1

## 2015-02-08 NOTE — Evaluation (Signed)
Physical Therapy Evaluation Patient Details Name: Bridget Martinez MRN: [id number] DOB: [birth year] Today's Date: 02/08/2015   History of Present Illness  64 yo female with recent L THA anterior approach has developed an abscess on R buttock, received I & D in ED and was referred to PT to see what is needed for safe gait.  Clinical Impression  Pt was seen for evaluation of gait and transfers after having a R buttock abscess with I & D in ED. Pt is having an issue with confusion, weakness and inabiltiy to stand for more than a short time.  She is having an unwillingness to go to SNF but is very appropriate to that level of care.    Follow Up Recommendations SNF    Equipment Recommendations  None recommended by PT    Recommendations for Other Services       Precautions / Restrictions Precautions Precautions: Fall Restrictions Weight Bearing Restrictions: No      Mobility  Bed Mobility Overal bed mobility: Needs Assistance Bed Mobility: Supine to Sit     Supine to sit: Mod assist     General bed mobility comments: cues for scooting out but needed assist to complete  Transfers Overall transfer level: Needs assistance Equipment used: Rolling walker (2 wheeled);1 person hand held assist (remidners for hand placement) Transfers: Sit to/from [facility] Sit to Stand: Mod assist Stand pivot transfers: Min assist       General transfer comment: 100% cued hand placement  Ambulation/Gait Ambulation/Gait assistance: Min assist;Mod assist Ambulation Distance (Feet): 3 Feet Assistive device: Rolling walker (2 wheeled);1 person hand held assist Gait Pattern/deviations: Step-to pattern;Decreased step length - left;Decreased step length - right;Decreased stride length;Wide base of support;Trunk flexed;Ataxic Gait velocity: reduced Gait velocity interpretation: Below normal speed for age/gender    Stairs            Wheelchair Mobility    Modified Rankin  (Stroke Patients Only)       Balance Overall balance assessment: Needs assistance Sitting-balance support: Feet supported Sitting balance-Leahy Scale: Fair   Postural control: Posterior lean Standing balance support: Bilateral upper extremity supported Standing balance-Leahy Scale: Poor                               Pertinent Vitals/Pain Pain Assessment: Faces Faces Pain Scale: Hurts little more Pain Location: R knee Pain Descriptors / Indicators: Aching Pain Intervention(s): Limited activity within patient's tolerance;Monitored during session;Repositioned;Premedicated before session    Home Living Family/patient expects to be discharged to:: Private residence   Available Help at Discharge: Family;Available PRN/intermittently;Other (Comment) Type of Home: House Home Access: Ramped entrance     Home Layout: One level Home Equipment: [place] - 2 wheels;Walker - 4 wheels;Bedside commode;Shower seat Additional Comments: Recently was at [facility] for L hip THA    Prior Function Level of [place]: Needs assistance   Gait / Transfers Assistance Needed: per son pt was amb with RW with therapy at SNF  ADL's / Homemaking Assistance Needed: assist for LB dressing and bathing        Hand Dominance        Extremity/Trunk Assessment   Upper Extremity Assessment: Overall WFL for tasks assessed           Lower Extremity Assessment: Generalized weakness      Cervical / Trunk Assessment: Normal  Communication   Communication: Other (comment) (slow speech and possibly confused)  Cognition Arousal/Alertness:  Lethargic Behavior During Therapy: Flat affect;Anxious Overall Cognitive Status: No family/caregiver present to determine baseline cognitive functioning       Memory: Decreased recall of precautions              General Comments General comments (skin integrity, edema, etc.): Pt was seen for evaluation of gait and mobiltiy, unable to  independently go home.      Exercises        Assessment/Plan    PT Assessment Patient needs continued PT services  PT Diagnosis Difficulty walking;Generalized weakness   PT Problem List Decreased strength;Decreased range of motion;Decreased activity tolerance;Decreased balance;Decreased mobility;Decreased coordination;Decreased cognition;Decreased knowledge of use of DME;Decreased safety awareness;Decreased knowledge of precautions;Pain;Decreased skin integrity  PT Treatment Interventions DME instruction;Gait training;Functional mobility training;Therapeutic activities;Therapeutic exercise;Balance training;Neuromuscular re-education;Cognitive remediation;Patient/family education   PT Goals (Current goals can be found in the Care Plan section) Acute Rehab PT Goals Patient Stated Goal: to go home PT Goal Formulation: With patient Time For Goal Achievement: 02/22/15 Potential to Achieve Goals: Good    Frequency Min 2X/week   Barriers to discharge Inaccessible home environment;Decreased caregiver support      Co-evaluation               End of Session   Activity Tolerance: No increased pain;Patient limited by fatigue;Patient limited by lethargy Patient left: in chair;with call bell/phone within reach;with chair alarm set Nurse Communication: Mobility status         Time: [id number] PT Time Calculation (min) (ACUTE ONLY): 30 min   Charges:   PT Evaluation $Initial PT Evaluation Tier I: 1 Procedure PT Treatments $Gait Training: 8-22 mins   PT G CodesRamond Dial 21-Feb-2015, 3:46 PM   Mee Hives, PT MS Acute Rehab Dept. Number: [phone] [phone] and [place] [phone]

## 2015-02-08 NOTE — Progress Notes (Signed)
[facility]                                                                              Patient Demographics  Bridget Martinez, is a 64 y.o. female, DOB - 1951/08/19, [id number]  Admit date - 02/03/2015   Admitting Physician [name], MD  Outpatient Primary MD for the patient is [name], [place]  LOS - 5   Chief Complaint  Patient presents with  . Pressure Ulcer       Brief HPI  64 yo female with  HTN, DM, HLD, CAD h/o CABG, CHF, CKD, Hypothyroidism, presented to [facility] ED with main concern of one week duration of draining ulcer in the right buttock area that has been draining with foul odor.  In ED, patient was noted to have right gluteal abscess and had I and D done in ED. No cultures noted. Patient was placed on IV vancomycin and Zosyn and hospitalist service requested for admission     Assessment & Plan      Abscess, gluteal, right - I&D was done in the ED with packing however no cultures sent - Appreciate general surgery recommendations, will continue dressing changes - change to doxy    Hypertension - BP currently stable, borderline soft, monitor closely   Mild acute on CKD (chronic kidney disease), stage III - Improving - Currently at baseline, 1.5    DM (diabetes mellitus), type 1, uncontrolled w/neurologic complication - CBGs controlled, continue Lantus, sliding scale insulin- WILL NEED TITRATION    Chronic combined systolic and diastolic congestive heart failure, pulmonary hypertension - Continue beta blocker, aspirin, spironolactone,    Hypothyroidism - Continue Synthroid  Candida/Yeast infection the left upper thigh skin, palmar skin loss - Per patient, she had a reaction to Macrobid, continue nystatin  UTI Urine culture showed only 25,000 colonies of Escherichia coli, afebrile, no dysuria  Code Status: Full code  Family Communication: husband at bedside   Disposition Plan:  HAS NOT BEEN OUT OF BED DURING ENTIRE  HOSPITALIZATION- STAT PT EVAL ORDERED  Time Spent in minutes   25 minutes  Procedures  I & D in ED   Consults   surgery  DVT Prophylaxis  Lovenox   Medications  Scheduled Meds: . aspirin EC  81 mg Oral Daily  . carvedilol  3.125 mg Oral BID WC  . doxycycline  100 mg Oral Q12H  . enoxaparin (LOVENOX) injection  40 mg Subcutaneous Q24H  . escitalopram  10 mg Oral Daily  . feeding supplement (PRO-STAT SUGAR FREE 64)  30 mL Oral Daily  . ferrous sulfate  325 mg Oral BID WC  . Glycerin (Adult)  1 suppository Rectal BID  . insulin aspart  0-15 Units Subcutaneous TID WC  . insulin aspart  0-5 Units Subcutaneous QHS  . insulin aspart  10 Units Subcutaneous TID WC  . insulin detemir  35 Units Subcutaneous q morning - 10a  . insulin detemir  5 Units Subcutaneous Once  . levothyroxine  125 mcg Oral Daily  . magnesium oxide  400 mg Oral Daily  . megestrol  400 mg Oral BID  . metoCLOPramide  5  mg Oral TID AC & HS  . nystatin cream   Topical BID  . pantoprazole  40 mg Oral Daily  . rosuvastatin  40 mg Oral Daily  . senna-docusate  2 tablet Oral QHS  . sodium chloride  1 g Oral BID WC  . spironolactone  25 mg Oral Daily  . vitamin C  500 mg Oral BID  . Vitamin D (Ergocalciferol)  50,000 Units Oral Q Sat  . zinc sulfate  220 mg Oral Daily   Continuous Infusions:  PRN Meds:.HYDROcodone-acetaminophen, ondansetron   Antibiotics   Anti-infectives    Start     Dose/Rate Route Frequency Ordered Stop   02/08/15 1130  doxycycline (VIBRA-TABS) tablet 100 mg     100 mg Oral Every 12 hours 02/08/15 1116     02/07/15 1500  vancomycin (VANCOCIN) IVPB 1000 mg/200 mL premix  Status:  Discontinued     1,000 mg 200 mL/hr over 60 Minutes Intravenous Every 24 hours 02/07/15 0917 02/08/15 1116   02/06/15 1500  vancomycin (VANCOCIN) IVPB 750 mg/150 ml premix  Status:  Discontinued     750 mg 150 mL/hr over 60 Minutes Intravenous Every 24 hours 02/06/15 1340 02/07/15 0917   02/04/15 2100   vancomycin (VANCOCIN) IVPB 750 mg/150 ml premix  Status:  Discontinued     750 mg 150 mL/hr over 60 Minutes Intravenous Every 24 hours 02/03/15 2049 02/05/15 0853   02/04/15 0200  piperacillin-tazobactam (ZOSYN) IVPB 3.375 g  Status:  Discontinued     3.375 g 12.5 mL/hr over 240 Minutes Intravenous Every 8 hours 02/03/15 2049 02/08/15 0934   02/03/15 2200  vancomycin (VANCOCIN) IVPB 1000 mg/200 mL premix     1,000 mg 200 mL/hr over 60 Minutes Intravenous  Once 02/03/15 2049 02/03/15 2320   02/03/15 1845  piperacillin-tazobactam (ZOSYN) IVPB 3.375 g     3.375 g 100 mL/hr over 30 Minutes Intravenous  Once 02/03/15 1833 02/03/15 1920   02/03/15 1845  vancomycin (VANCOCIN) IVPB 1000 mg/200 mL premix  Status:  Discontinued     1,000 mg 200 mL/hr over 60 Minutes Intravenous  Once 02/03/15 1833 02/03/15 1949        Subjective:   Bridget Martinez says she could barely walk before admission and has not been out of bed since admission No CP, no SOB   Objective:   Blood pressure 114/44, pulse 84, temperature 99 F (37.2 C), temperature source Oral, resp. rate 18, height 5\' 2"  (1.575 m), weight 63.2 kg (139 lb 5.3 oz), SpO2 100 %.  Wt Readings from Last 3 Encounters:  02/03/15 63.2 kg (139 lb 5.3 oz)  12/28/14 60.102 kg (132 lb 8 oz)  11/28/14 64.229 kg (141 lb 9.6 oz)     Intake/Output Summary (Last 24 hours) at 02/08/15 1122 Last data filed at 02/08/15 0132  Gross per 24 hour  Intake      0 ml  Output   1400 ml  Net  -1400 ml    Exam  General: Alert and oriented x 3, NAD  CVS: S1 S2 clear, RRR  Respiratory: CTAB  Abdomen: Soft, nontender, nondistended, + bowel sounds  Ext: no cyanosis clubbing or edema    Data Review   Micro Results Recent Results (from the past 240 hour(s))  Urine culture     Status: None   Collection Time: 02/04/15 11:18 AM  Result Value Ref Range Status   Specimen Description URINE, CATHETERIZED  Final   Special Requests NONE  Final  Colony  Count   Final    25,000 COLONIES/ML Performed at [facility]    Culture   Final    ESCHERICHIA COLI Performed at [facility]    Report Status 02/06/2015 FINAL  Final   Organism ID, Bacteria ESCHERICHIA COLI  Final      Susceptibility   Escherichia coli - MIC*    AMPICILLIN >=32 RESISTANT Resistant     CEFAZOLIN >=64 RESISTANT Resistant     CEFTRIAXONE <=1 SENSITIVE Sensitive     CIPROFLOXACIN >=4 RESISTANT Resistant     GENTAMICIN >=16 RESISTANT Resistant     LEVOFLOXACIN >=8 RESISTANT Resistant     NITROFURANTOIN <=16 SENSITIVE Sensitive     TOBRAMYCIN 8 INTERMEDIATE Intermediate     TRIMETH/SULFA >=320 RESISTANT Resistant     PIP/TAZO >=128 RESISTANT Resistant     * ESCHERICHIA COLI  Wound culture     Status: None (Preliminary result)   Collection Time: 02/06/15  1:30 PM  Result Value Ref Range Status   Specimen Description WOUND RIGHT BUTTOCKS  Final   Special Requests Normal  Final   Gram Stain   Final    ABUNDANT WBC PRESENT, PREDOMINANTLY PMN NO SQUAMOUS EPITHELIAL CELLS SEEN FEW GRAM POSITIVE COCCI IN CLUSTERS Performed at [facility]    Culture   Final    MODERATE STAPHYLOCOCCUS AUREUS Note: RIFAMPIN AND GENTAMICIN SHOULD NOT BE USED AS SINGLE DRUGS FOR TREATMENT OF STAPH INFECTIONS. Performed at [facility]    Report Status PENDING  Incomplete    Radiology Reports No results found.  CBC  Recent Labs Lab 02/03/15 1555 02/04/15 0523 02/06/15 0825  WBC 10.7* 8.5 6.2  HGB 10.2* 9.3* 10.1*  HCT 29.7* 27.7* 30.0*  PLT 464* 395 422*  MCV 85.1 85.8 86.7  MCH 29.2 28.8 29.2  MCHC 34.3 33.6 33.7  RDW 13.8 14.0 14.1  LYMPHSABS 1.2  --   --   MONOABS 1.2*  --   --   EOSABS 0.1  --   --   BASOSABS 0.0  --   --     Chemistries   Recent Labs Lab 02/03/15 1555 02/04/15 0523 02/06/15 0825 02/07/15 0645 02/08/15 0555  NA 134* 133* 134* 133* 133*  K 3.4* 3.8 3.5 3.9 4.0  CL 104 105 103 100* 99*  CO2 20* 16* 20*  20* 22  GLUCOSE 279* 451* 220* 409* 315*  BUN 33* 30* 17 21* 22*  CREATININE 1.92* 1.71* 1.30* 1.17* 1.12*  CALCIUM 9.9 9.1 9.0 8.9 9.1  AST 12*  --   --   --   --   ALT 13*  --   --   --   --   ALKPHOS 149*  --   --   --   --   BILITOT 0.7  --   --   --   --    ------------------------------------------------------------------------------------------------------------------ estimated creatinine clearance is 44.3 mL/min (by C-G formula based on Cr of 1.12). ------------------------------------------------------------------------------------------------------------------ No results for input(s): HGBA1C in the last 72 hours. ------------------------------------------------------------------------------------------------------------------ No results for input(s): CHOL, HDL, LDLCALC, TRIG, CHOLHDL, LDLDIRECT in the last 72 hours. ------------------------------------------------------------------------------------------------------------------ No results for input(s): TSH, T4TOTAL, T3FREE, THYROIDAB in the last 72 hours.  Invalid input(s): FREET3 ------------------------------------------------------------------------------------------------------------------ No results for input(s): VITAMINB12, FOLATE, FERRITIN, TIBC, IRON, RETICCTPCT in the last 72 hours.  Coagulation profile No results for input(s): INR, PROTIME in the last 168 hours.  No results for input(s): DDIMER in the last 72 hours.  Cardiac  Enzymes No results for input(s): CKMB, TROPONINI, MYOGLOBIN in the last 168 hours.  Invalid input(s): CK ------------------------------------------------------------------------------------------------------------------ Invalid input(s): Vaughn  02/07/15 1113 02/07/15 1613 02/07/15 1945 02/07/15 2224 02/08/15 0415 02/08/15 0619  GLUCAP 345* 244* 181* 170* 260* 328*     Aison Malveaux DO. [facility] 02/08/2015, 11:22 AM  Pager: [phone]     After 7pm go  to [email or web address] - password [email or web address]  Call night coverage person covering after 7pm

## 2015-02-08 NOTE — Progress Notes (Signed)
Patient had large bowel movement. Dressing soiled. Wound redressed according to orders. Patient turned.

## 2015-02-08 NOTE — Progress Notes (Signed)
Pressure wound redressed, flushed with 338mL normal saline, packed with 1/4 in iodoform. Patient tolerated dressing well. Patient turned onto left side. Will continue to monitor. Call bell within patient's reach

## 2015-02-08 NOTE — Progress Notes (Signed)
Patient never received the additional Levemir 5 units yesterday to get a total of the 35 units that was ordered by MD. Glucose 300's this am. No recommendations today. Watch trends with the new dose of Levemir. Correction scale and meal coverage also increased yesterday.  Thanks,  Tama Headings RN, MSN, [facility] Inpatient Diabetes Coordinator Team Pager [phone]

## 2015-02-09 DIAGNOSIS — L0231 Cutaneous abscess of buttock: Principal | ICD-10-CM

## 2015-02-09 LAB — WOUND CULTURE: Special Requests: NORMAL

## 2015-02-09 LAB — GLUCOSE, CAPILLARY
Glucose-Capillary: 331 mg/dL — ABNORMAL HIGH (ref 65–99)
Glucose-Capillary: 284 mg/dL — ABNORMAL HIGH (ref 65–99)

## 2015-02-09 LAB — BASIC METABOLIC PANEL
ANION GAP: 10 (ref 5–15)
BUN: 25 mg/dL — AB (ref 6–20)
CHLORIDE: 100 mmol/L — AB (ref 101–111)
CO2: 22 mmol/L (ref 22–32)
Calcium: 9 mg/dL (ref 8.9–10.3)
Creatinine, Ser: 0.91 mg/dL (ref 0.44–1.00)
GFR calc Af Amer: >60 mL/min (ref >60)
Glucose, Bld: 326 mg/dL — ABNORMAL HIGH (ref 65–99)
Potassium: 5.2 mmol/L — ABNORMAL HIGH (ref 3.5–5.1)
SODIUM: 132 mmol/L — AB (ref 135–145)

## 2015-02-09 MED ORDER — LEVEMIR FLEXTOUCH 100 UNIT/ML ~~LOC~~ SOPN: 40.0000 [IU] | PEN_INJECTOR | SUBCUTANEOUS | Status: DC

## 2015-02-09 MED ORDER — DOXYCYCLINE HYCLATE 100 MG PO TABS: 100.0000 mg | ORAL_TABLET | Freq: Two times a day (BID) | ORAL | Status: DC

## 2015-02-09 MED ORDER — HYDROCODONE-ACETAMINOPHEN 5-325 MG PO TABS: 1.0000 | ORAL_TABLET | ORAL | Status: DC | PRN

## 2015-02-09 NOTE — Progress Notes (Signed)
Inpatient Diabetes Program Recommendations  AACE/ADA: New Consensus Statement on Inpatient Glycemic Control (2013)  Target Ranges:  Prepandial:   less than 140 mg/dL      Peak postprandial:   less than 180 mg/dL (1-2 hours)      Critically ill patients:  140 - 180 mg/dL   Results for Bridget Martinez, Bridget Martinez (MRN [id number]) as of 02/09/2015 10:11  Ref. Range 02/08/2015 06:19 02/08/2015 11:14 02/08/2015 16:27 02/08/2015 21:23 02/09/2015 07:00  Glucose-Capillary Latest Ref Range: 65-99 mg/dL 328 (H) 216 (H) 178 (H) 139 (H) 331 (H)   Diabetes history: DM 2 Outpatient Diabetes medications: Levemir 24 units Daily, Humalog 6-10 units TID Current orders for Inpatient glycemic control: Levemir 35 units Daily, Novolog 10 units meal coverage, Novolog 0-15 units TID, Novolog 0-5 units QHS,   Inpatient Diabetes Program Recommendations Insulin - Basal: Fasting glucose in the 300's still. Please consider increasing basal insulin to Levemir 42 units Daily. If dose already given please give an additional dose of basal insulin 7 units Now. Patient required 70 units of Novolog in the past 24 hours.   Thanks,  Tama Headings RN, MSN, [facility] Inpatient Diabetes Coordinator Team Pager [phone]

## 2015-02-09 NOTE — Care Management Note (Signed)
Case Management Note  Patient Details  Name: Leonardo V Wyer MRN: [id number] Date of Birth: 08/20/1951  Subjective/Objective:                    Action/Plan:  Met with patient and husband to discuss discharge disposition.  Therapy is recommending SNF at discharge.  Patient is currently declining SNF, stating she has been 3 times prior and will not go back.  Home health has already been arranged.  Attending MD and CSW are aware of plans to discharge home with home health. Expected Discharge Date:                  Expected Discharge Plan:  Home w Home Health Services  In-House Referral:     Discharge planning Services  CM Consult  Post Acute Care Choice:    Choice offered to:  Patient  DME Arranged:    DME Agency:     HH Arranged:  RN, PT, OT HH Agency:  [facility]  Status of Service:     Medicare Important Message Given:  No Date Medicare IM Given:    Medicare IM give by:    Date Additional Medicare IM Given:    Additional Medicare Important Message give by:     If discussed at Long Length of Stay Meetings, dates discussed:    Additional Comments:  Robarge, Courtney C, RN 02/09/2015, 11:30 AM  

## 2015-02-09 NOTE — Progress Notes (Signed)
Pt discharging at this time with family alert, verbal taking all personal belongings. IV discontinued, dry dressing applied. Discharge instructions provided with verbal understanding. Pt aware of follow up appts. No noted distres. She denies pain or discomfort. Dressing to right buttock changed clean, dry and intact.

## 2015-02-09 NOTE — Discharge Summary (Signed)
Physician Discharge Summary  Bridget Martinez [id number] DOB: 1951/04/15 DOA: 02/03/2015  PCP: [name], FNP  Admit date: 02/03/2015 Discharge date: 02/09/2015  Time spent: greater than 30 minutes  Recommendations for Outpatient Follow-up:  1. SNF recommended. Pt declines. Will go home with home health.  Discharge Diagnoses:  Principal Problem:   Abscess, gluteal, right Active Problems:   Hypertension   CKD (chronic kidney disease), stage III   DM (diabetes mellitus), type 1, uncontrolled w/neurologic complication   Chronic combined systolic and diastolic congestive heart failure   Hypothyroidism   Discharge Condition: stable  Diet recommendation: diabetic heart healthy  Filed Weights   02/03/15 2000  Weight: 63.2 kg (139 lb 5.3 oz)    History of present illness/Hospital Course:  64 yo female with HTN, DM, HLD, CAD h/o CABG, CHF, CKD, Hypothyroidism, presented to [facility] ED with main concern of one week duration of draining ulcer in the right buttock area that has been draining with foul odor.  In ED, patient was noted to have right gluteal abscess and had I and D done in ED. No cultures noted. Patient was placed on IV vancomycin and Zosyn and hospitalist service requested for admission   Abscess, gluteal, right - I&D was done in the ED with packing however no cultures sent Surgery consulted Improving. Continue packing with iodoform gauze   Hypertension Remained stable  Mild acute on CKD (chronic kidney disease), stage III - Improved - Currently at baseline, 1.5   DM (diabetes mellitus), type 2, uncontrolled. Insuline adjusted   Chronic combined systolic and diastolic congestive heart failure, pulmonary hypertension, compensated. - Continue beta blocker, aspirin, spironolactone,    Hypothyroidism - Continue Synthroid  Candida/Yeast infection the left upper thigh skin, palmar skin loss Improved with nystatin  Abnormal UA Urine culture showed  only 25,000 colonies of Escherichia coli, afebrile, no dysuria        Procedures:  i and d gluteal abscess  Consultations:  General surgery  Discharge Exam: Filed Vitals:   02/09/15 0626  BP: 108/47  Pulse: 78  Temp: 98.4 F (36.9 C)  Resp: 20    General: a and o Cardiovascular: RRR Respiratory: CTA Skin: wound with no surrounding cellulitis. Minimal drainage.  Discharge Instructions   Discharge Instructions    Diet - low sodium heart healthy    Complete by:  As directed      Diet Carb Modified    Complete by:  As directed      Discharge wound care:    Complete by:  As directed   Pack wound with iodoform gauze daily     Walk with assistance    Complete by:  As directed           Current Discharge Medication List    START taking these medications   Details  doxycycline (VIBRA-TABS) 100 MG tablet Take 1 tablet (100 mg total) by mouth every 12 (twelve) hours. Qty: 10 tablet, Refills: 0      CONTINUE these medications which have CHANGED   Details  HYDROcodone-acetaminophen (NORCO) 5-325 MG per tablet Take 1 tablet by mouth every 4 (four) hours as needed for moderate pain. Qty: 20 tablet, Refills: 0    LEVEMIR FLEXTOUCH 100 UNIT/ML Pen Inject 40 Units into the skin every morning. Qty: 15 mL, Refills: 0      CONTINUE these medications which have NOT CHANGED   Details  aspirin EC 81 MG tablet Take 1 tablet (81 mg total) by mouth daily. Qty: 90  tablet, Refills: 3    buPROPion (WELLBUTRIN XL) 150 MG 24 hr tablet Take 150 mg by mouth 2 (two) times daily. Refills: 0    carvedilol (COREG) 3.125 MG tablet Take 1 tablet (3.125 mg total) by mouth 2 (two) times daily with a meal. Qty: 60 tablet, Refills: 3    escitalopram (LEXAPRO) 10 MG tablet Take 1 tablet (10 mg total) by mouth daily. Qty: 30 tablet, Refills: 11   Associated Diagnoses: Depression    ferrous sulfate 325 (65 FE) MG tablet Take 1 tablet (325 mg total) by mouth 2 (two) times daily with a  meal. Qty: 30 tablet, Refills: 3    insulin lispro (HUMALOG) 100 UNIT/ML injection Inject 6-10 Units into the skin 3 (three) times daily before meals. Take 10 units with breakfast and lunch.  Take 6 units with supper.    levothyroxine (SYNTHROID, LEVOTHROID) 125 MCG tablet Take 1 tablet (125 mcg total) by mouth daily. Qty: 30 tablet, Refills: 11   Associated Diagnoses: Unspecified hypothyroidism    magnesium oxide (MAG-OX) 400 (241.3 MG) MG tablet Take 1 tablet (400 mg total) by mouth daily. Qty: 30 tablet, Refills: 0    megestrol (MEGACE) 400 MG/10ML suspension Take 10 mLs (400 mg total) by mouth 2 (two) times daily. Qty: 240 mL, Refills: 0    metoCLOPramide (REGLAN) 5 MG tablet Take 5 mg by mouth 3 (three) times daily before meals.  Refills: 0    Multiple Vitamin (MULTIVITAMIN) tablet Take 1 tablet by mouth daily.    ondansetron (ZOFRAN ODT) 8 MG disintegrating tablet Take 1 tablet (8 mg total) by mouth every 8 (eight) hours as needed for nausea or vomiting. Qty: 20 tablet, Refills: 1    pantoprazole (PROTONIX) 40 MG tablet Take 1 tablet (40 mg total) by mouth daily. Qty: 30 tablet, Refills: 3    rosuvastatin (CRESTOR) 40 MG tablet Take 1 tablet (40 mg total) by mouth daily. Qty: 30 tablet, Refills: 11   Associated Diagnoses: Other and unspecified hyperlipidemia    spironolactone (ALDACTONE) 25 MG tablet Take 1 tablet (25 mg total) by mouth daily.    tolnaftate (TINACTIN) 1 % spray Apply 1 application topically daily.    BAYER CONTOUR NEXT TEST test strip 3 (three) times daily. for testing Refills: 10    BAYER MICROLET LANCETS lancets Refills: 3    Vitamin D, Ergocalciferol, (DRISDOL) 50000 UNITS CAPS capsule Take 50,000 Units by mouth every Saturday.       STOP taking these medications     KLOR-CON M20 20 MEQ tablet      Amino Acids-Protein Hydrolys (FEEDING SUPPLEMENT, PRO-STAT SUGAR FREE 64,) LIQD      Glycerin, Adult, 2.1 G SUPP      insulin detemir (LEVEMIR)  100 UNIT/ML injection      metoCLOPramide (REGLAN) 5 MG/5ML solution      senna-docusate (SENOKOT-S) 8.6-50 MG per tablet      sodium chloride 1 G tablet      vitamin C (ASCORBIC ACID) 500 MG tablet      zinc sulfate 220 MG capsule        Allergies  Allergen Reactions  . Penicillins Anaphylaxis and Hives    Tolerates zosyn  . Sulfa Antibiotics Anaphylaxis and Swelling    Stiff neck also   . Macrobid [employer] Macro] Other (See Comments)    Skin peels off   Follow-up Information    Follow up with [name], FNP.   Specialty:  Nurse Practitioner   Why:  for wound check in 2 weeks   Contact information:   [place] [address] [phone]        The results of significant diagnostics from this hospitalization (including imaging, microbiology, ancillary and laboratory) are listed below for reference.    Significant Diagnostic Studies: No results found.  Microbiology: Recent Results (from the past 240 hour(s))  Urine culture     Status: None   Collection Time: 02/04/15 11:18 AM  Result Value Ref Range Status   Specimen Description URINE, CATHETERIZED  Final   Special Requests NONE  Final   Colony Count   Final    25,000 COLONIES/ML Performed at [facility]    Culture   Final    ESCHERICHIA COLI Performed at [facility]    Report Status 02/06/2015 FINAL  Final   Organism ID, Bacteria ESCHERICHIA COLI  Final      Susceptibility   Escherichia coli - MIC*    AMPICILLIN >=32 RESISTANT Resistant     CEFAZOLIN >=64 RESISTANT Resistant     CEFTRIAXONE <=1 SENSITIVE Sensitive     CIPROFLOXACIN >=4 RESISTANT Resistant     GENTAMICIN >=16 RESISTANT Resistant     LEVOFLOXACIN >=8 RESISTANT Resistant     NITROFURANTOIN <=16 SENSITIVE Sensitive     TOBRAMYCIN 8 INTERMEDIATE Intermediate     TRIMETH/SULFA >=320 RESISTANT Resistant     PIP/TAZO >=128 RESISTANT Resistant     * ESCHERICHIA COLI  Wound  culture     Status: None   Collection Time: 02/06/15  1:30 PM  Result Value Ref Range Status   Specimen Description WOUND RIGHT BUTTOCKS  Final   Special Requests Normal  Final   Gram Stain   Final    ABUNDANT WBC PRESENT, PREDOMINANTLY PMN NO SQUAMOUS EPITHELIAL CELLS SEEN FEW GRAM POSITIVE COCCI IN CLUSTERS Performed at [facility]    Culture   Final    MODERATE METHICILLIN RESISTANT STAPHYLOCOCCUS AUREUS Note: RIFAMPIN AND GENTAMICIN SHOULD NOT BE USED AS SINGLE DRUGS FOR TREATMENT OF STAPH INFECTIONS. This organism is presumed to be Clindamycin resistant based on detection of inducible Clindamycin resistance. LAQUITTA C. AT 9:05AM ON 02/09/15 HAJAM Performed at [facility]    Report Status 02/09/2015 FINAL  Final   Organism ID, Bacteria METHICILLIN RESISTANT STAPHYLOCOCCUS AUREUS  Final      Susceptibility   Methicillin resistant staphylococcus aureus - MIC*    CLINDAMYCIN RESISTANT      ERYTHROMYCIN RESISTANT      GENTAMICIN <=0.5 SENSITIVE Sensitive     LEVOFLOXACIN >=8 RESISTANT Resistant     OXACILLIN >=4 RESISTANT Resistant     PENICILLIN >=0.5 RESISTANT Resistant     RIFAMPIN <=0.5 SENSITIVE Sensitive     TRIMETH/SULFA <=10 SENSITIVE Sensitive     VANCOMYCIN 1 SENSITIVE Sensitive     TETRACYCLINE <=1 SENSITIVE Sensitive     * MODERATE METHICILLIN RESISTANT STAPHYLOCOCCUS AUREUS     Labs: Basic Metabolic Panel:  Recent Labs Lab 02/04/15 0523 02/06/15 0825 02/07/15 0645 02/08/15 0555 02/09/15 0615  NA 133* 134* 133* 133* 132*  K 3.8 3.5 3.9 4.0 5.2*  CL 105 103 100* 99* 100*  CO2 16* 20* 20* 22 22  GLUCOSE 451* 220* 409* 315* 326*  BUN 30* 17 21* 22* 25*  CREATININE 1.71* 1.30* 1.17* 1.12* 0.91  CALCIUM 9.1 9.0 8.9 9.1 9.0   Liver Function Tests:  Recent Labs Lab 02/03/15 1555  AST 12*  ALT 13*  ALKPHOS 149*  BILITOT 0.7  PROT 7.3  ALBUMIN 3.5   No results for input(s): LIPASE, AMYLASE in the last 168 hours. No results for  input(s): AMMONIA in the last 168 hours. CBC:  Recent Labs Lab 02/03/15 1555 02/04/15 0523 02/06/15 0825  WBC 10.7* 8.5 6.2  NEUTROABS 8.2*  --   --   HGB 10.2* 9.3* 10.1*  HCT 29.7* 27.7* 30.0*  MCV 85.1 85.8 86.7  PLT 464* 395 422*   Cardiac Enzymes: No results for input(s): CKTOTAL, CKMB, CKMBINDEX, TROPONINI in the last 168 hours. BNP: BNP (last 3 results)  Recent Labs  11/15/14 1500 11/20/14 0150 12/28/14 1140  BNP 423.4* 321.3* 192.0*    ProBNP (last 3 results)  Recent Labs  06/15/14 1035 07/26/14 1800  PROBNP 650.1* 2156.0*    CBG:  Recent Labs Lab 02/08/15 1114 02/08/15 1627 02/08/15 2123 02/09/15 0700 02/09/15 1121  GLUCAP 216* 178* 139* 331* 284*       Signed:  Geovanna Simko L  [facility] 02/09/2015, 12:19 PM

## 2015-02-09 NOTE — Progress Notes (Signed)
Physical Therapy Treatment Patient Details Name: Bridget Martinez MRN: [id number] DOB: 06-28-1951 Today's Date: 02/09/2015    History of Present Illness 64 yo female with recent L THA anterior approach has developed an abscess on R buttock, received I & D in ED and was referred to PT to see what is needed for safe gait.    PT Comments    Pt was assessed for changes from yesterday and was very capable of assisting PT with walk and bed mob.  Motivated but upset as she thought she would be staying here until she can walk.  Reminded her that hosp for acute illness and SNF for recovery of mobility.  Her mind is set to go home despite her concerns.  Follow Up Recommendations  SNF     Equipment Recommendations  None recommended by PT    Recommendations for Other Services       Precautions / Restrictions Precautions Precautions: Fall Restrictions Weight Bearing Restrictions: No    Mobility  Bed Mobility Overal bed mobility: Needs Assistance Bed Mobility: Supine to Sit;Sit to Supine     Supine to sit: Min assist Sit to supine: Supervision;Min guard   General bed mobility comments: reminders for sequencing getting out but in without assist verbally  Transfers Overall transfer level: Needs assistance Equipment used: Rolling walker (2 wheeled);1 person hand held assist (reminders for hand placement) Transfers: Sit to/from [employer] Transfers Sit to Stand: Min guard Stand pivot transfers: Min guard       General transfer comment: 100% cued hand placement  Ambulation/Gait Ambulation/Gait assistance: Min guard Ambulation Distance (Feet): 20 Feet Assistive device: Rolling walker (2 wheeled);1 person hand held assist Gait Pattern/deviations: Step-through pattern;Decreased stride length;Wide base of support;Trunk flexed Gait velocity: reduced Gait velocity interpretation: Below normal speed for age/gender     Stairs            Wheelchair Mobility    Modified  Rankin (Stroke Patients Only)       Balance     Sitting balance-Leahy Scale: Good     Standing balance support: Bilateral upper extremity supported Standing balance-Leahy Scale: Fair                      Cognition Arousal/Alertness: Awake/alert Behavior During Therapy: Flat affect;Anxious Overall Cognitive Status: No family/caregiver present to determine baseline cognitive functioning       Memory: Decreased short-term memory              Exercises      General Comments General comments (skin integrity, edema, etc.): Pt more alert and more independent today but is still SNF candidate.  Pt cannot be swayed and finally told her if home is an issue to call the doctor when she gets home      Pertinent Vitals/Pain Pain Assessment: Faces Pain Score: 2  Faces Pain Scale: Hurts a little bit Pain Location: R knee Pain Intervention(s): Limited activity within patient's tolerance;Monitored during session;Premedicated before session;Repositioned    Home Living                      Prior Function            PT Goals (current goals can now be found in the care plan section) Acute Rehab PT Goals Patient Stated Goal: to go home Progress towards PT goals: Progressing toward goals    Frequency  Min 2X/week    PT Plan Current plan remains appropriate  Co-evaluation             End of Session Equipment Utilized During Treatment: Gait belt Activity Tolerance: No increased pain;Patient limited by fatigue Patient left: in bed;with call bell/phone within reach;with nursing/sitter in room     Time: 1400-1429 PT Time Calculation (min) (ACUTE ONLY): 29 min  Charges:  $Gait Training: 8-22 mins $Therapeutic Activity: 8-22 mins                    G Codes:      Ramond Dial Mar 04, 2015, 3:15 PM   Mee Hives, PT MS Acute Rehab Dept. Number: [facility] [id number] and [place] [phone]

## 2015-02-14 ENCOUNTER — Encounter: Payer: Self-pay | Admitting: Family Medicine

## 2015-02-16 NOTE — Telephone Encounter (Signed)
Open in error

## 2015-02-17 ENCOUNTER — Ambulatory Visit (INDEPENDENT_AMBULATORY_CARE_PROVIDER_SITE_OTHER): Payer: BC Managed Care – PPO | Admitting: Family Medicine

## 2015-02-17 ENCOUNTER — Encounter: Payer: Self-pay | Admitting: Family Medicine

## 2015-02-17 VITALS — BP 109/56 | HR 80 | Temp 98.5°F | Ht 62.0 in | Wt 135.0 lb

## 2015-02-17 DIAGNOSIS — E785 Hyperlipidemia, unspecified: Secondary | ICD-10-CM | POA: Diagnosis not present

## 2015-02-17 DIAGNOSIS — L8990 Pressure ulcer of unspecified site, unspecified stage: Secondary | ICD-10-CM

## 2015-02-17 DIAGNOSIS — IMO0002 Reserved for concepts with insufficient information to code with codable children: Secondary | ICD-10-CM

## 2015-02-17 DIAGNOSIS — E039 Hypothyroidism, unspecified: Secondary | ICD-10-CM | POA: Diagnosis not present

## 2015-02-17 DIAGNOSIS — E1049 Type 1 diabetes mellitus with other diabetic neurological complication: Secondary | ICD-10-CM

## 2015-02-17 DIAGNOSIS — E1041 Type 1 diabetes mellitus with diabetic mononeuropathy: Secondary | ICD-10-CM

## 2015-02-17 DIAGNOSIS — F329 Major depressive disorder, single episode, unspecified: Secondary | ICD-10-CM | POA: Diagnosis not present

## 2015-02-17 DIAGNOSIS — F32A Depression, unspecified: Secondary | ICD-10-CM

## 2015-02-17 DIAGNOSIS — E1065 Type 1 diabetes mellitus with hyperglycemia: Principal | ICD-10-CM

## 2015-02-17 MED ORDER — BUPROPION HCL ER (XL) 150 MG PO TB24: 150.0000 mg | ORAL_TABLET | Freq: Two times a day (BID) | ORAL | Status: DC

## 2015-02-17 MED ORDER — ONDANSETRON 8 MG PO TBDP: 8.0000 mg | ORAL_TABLET | Freq: Three times a day (TID) | ORAL | Status: DC | PRN

## 2015-02-17 MED ORDER — LEVEMIR FLEXTOUCH 100 UNIT/ML ~~LOC~~ SOPN: 24.0000 [IU] | PEN_INJECTOR | SUBCUTANEOUS | Status: DC

## 2015-02-17 MED ORDER — MEGESTROL ACETATE 400 MG/10ML PO SUSP: 400.0000 mg | Freq: Two times a day (BID) | ORAL | Status: DC

## 2015-02-17 MED ORDER — LEVOTHYROXINE SODIUM 125 MCG PO TABS
125.0000 ug | ORAL_TABLET | Freq: Every day | ORAL | Status: DC
Start: 2015-02-17 — End: 2015-08-22

## 2015-02-17 MED ORDER — ROSUVASTATIN CALCIUM 40 MG PO TABS: 40.0000 mg | ORAL_TABLET | Freq: Every day | ORAL | Status: DC

## 2015-02-17 MED ORDER — CARVEDILOL 3.125 MG PO TABS: 3.1250 mg | ORAL_TABLET | Freq: Two times a day (BID) | ORAL | Status: DC

## 2015-02-17 MED ORDER — ESCITALOPRAM OXALATE 10 MG PO TABS: 10.0000 mg | ORAL_TABLET | Freq: Every day | ORAL | Status: DC

## 2015-02-17 MED ORDER — INSULIN LISPRO 100 UNIT/ML ~~LOC~~ SOLN: SUBCUTANEOUS | Status: DC

## 2015-02-17 MED ORDER — SPIRONOLACTONE 25 MG PO TABS: 25.0000 mg | ORAL_TABLET | Freq: Every day | ORAL | Status: DC

## 2015-02-17 MED ORDER — METOCLOPRAMIDE HCL 5 MG PO TABS: 5.0000 mg | ORAL_TABLET | Freq: Three times a day (TID) | ORAL | Status: DC

## 2015-02-17 MED ORDER — PANTOPRAZOLE SODIUM 40 MG PO TBEC: 40.0000 mg | DELAYED_RELEASE_TABLET | Freq: Every day | ORAL | Status: DC

## 2015-02-17 NOTE — Addendum Note (Signed)
Addended by: Ilean China on: 02/17/2015 01:58 PM   Modules accepted: Orders

## 2015-02-17 NOTE — Progress Notes (Signed)
Subjective:    Patient ID: Bridget Martinez, female    DOB: 09/17/1950, 64 y.o.   MRN: [id number]  HPI  64 year old female with complicated medical history. She had hip replacement in March was discharged to a skilled facility for rehabilitation, but apparently developed a skin breakdown in the facility in which broke down further to abscess and she was treated for 1 week for the buttock abscess in the hospital. Since she has been home she has been on doxycycline, and her daughter who is a Copywriter, advertising has been doing dressing changes and packing changes daily.  Her history is complicated by diabetes which has not been well controlled recently due to the infections. She also has a history of gastroparesis well controlled with Reglan. She sees a cardiologist but I do not know much about her heart history.     Review of Systems  Constitutional: Positive for fatigue.  HENT: Negative.   Respiratory: Negative.   Cardiovascular: Negative.   Gastrointestinal: Negative.   Skin: Positive for wound.  Psychiatric/Behavioral: Negative.        Objective:   Physical Exam  Constitutional: She appears well-developed and well-nourished.  Cardiovascular: Normal rate and regular rhythm.   Pulmonary/Chest: Effort normal and breath sounds normal.  Skin:  Wound on right buttock was examined. Packing removed. There is no drainage and I do not believe there is any cellulitis. I repacked it today but instructed the granddaughter that if there is no drainage over the weekend just stop packing and let it heal in by secondary intention. The wound may be irrigated with in the shower or with a syringe.          Assessment & Plan:  1. DM (diabetes mellitus), type 1, uncontrolled w/neurologic complication Even though sugars are elevated and you of recent infection would rather be elevated then to low. Currently takes 24 Levemir and 10 of Humalog with each meal. She will follow up with endocrinologist later  this month  2. Pressure ulcer Into new dressing changes and packing through the weekend and if no further drainage just continue packing and let heal by secondary intention  Wardell Honour MD  Patient Active Problem List   Diagnosis Date Noted  . Abscess, gluteal, right 02/06/2015  . Allergic dermatitis 02/03/2015  . Pressure ulcer 02/03/2015  . Abscess of buttock, right 02/03/2015  . Abscess of right buttock   . Diabetes 11/19/2014  . Left displaced femoral neck fracture 11/19/2014  . Fall   . Closed left hip fracture   . Diabetic ketoacidosis without coma associated with diabetes mellitus due to underlying condition   . Anemia 11/02/2014  . NSTEMI (non-ST elevated myocardial infarction)   . Diabetic gastroparesis associated with type 1 diabetes mellitus   . Protein-calorie malnutrition, severe 10/27/2014  . DKA, type 1 10/26/2014  . Acute renal failure 10/26/2014  . Encephalopathy acute 10/26/2014  . Hyperkalemia 10/26/2014  . Chronic combined systolic and diastolic congestive heart failure 09/19/2014  . Hypothyroidism 09/19/2014  . Abscess of left thigh 09/15/2014  . Blood poisoning   . Diabetic ketoacidosis with coma associated with other specified diabetes mellitus   . SIRS (systemic inflammatory response syndrome) 07/30/2014  . Respiratory failure 07/26/2014  . Acidosis   . Altered mental status   . Diabetic ketoacidosis with coma associated with type 2 diabetes mellitus   . Tachycardia   . Chronic systolic heart failure 99991111  . Hypoglycemia 11/27/2013  . Hypotension 11/27/2013  . Hypokalemia 11/27/2013  .  Nausea with vomiting 11/19/2013  . Abdominal pain, right lower quadrant 11/18/2013  . Acute on chronic renal failure 11/15/2013  . Acute on chronic systolic heart failure 99991111  . DM (diabetes mellitus), type 1, uncontrolled w/neurologic complication 99991111  . Diabetic gastroparesis 11/13/2013  . UTI (urinary tract infection) 11/11/2013  . CKD  (chronic kidney disease), stage III 11/11/2013  . Yeast infection of the vagina 10/27/2013  . Myocardial infarction   . Hypertension   . Heart murmur   . DKA (diabetic ketoacidoses) 09/03/2013  . Chronic systolic congestive heart failure, NYHA class 3 08/25/2011  . CAD, multiple vessel 07/27/2011  . Depression 07/25/2011  . Hyperlipidemia   . Venous stasis ulcers    Outpatient Encounter Prescriptions as of 02/17/2015  Medication Sig  . aspirin EC 81 MG tablet Take 1 tablet (81 mg total) by mouth daily.  Marland Kitchen BAYER CONTOUR NEXT TEST test strip 3 (three) times daily. for testing  . BAYER MICROLET LANCETS lancets   . buPROPion (WELLBUTRIN XL) 150 MG 24 hr tablet Take 150 mg by mouth 2 (two) times daily.  . carvedilol (COREG) 3.125 MG tablet Take 1 tablet (3.125 mg total) by mouth 2 (two) times daily with a meal.  . doxycycline (VIBRA-TABS) 100 MG tablet Take 1 tablet (100 mg total) by mouth every 12 (twelve) hours.  Marland Kitchen escitalopram (LEXAPRO) 10 MG tablet Take 1 tablet (10 mg total) by mouth daily.  . ferrous sulfate 325 (65 FE) MG tablet Take 1 tablet (325 mg total) by mouth 2 (two) times daily with a meal.  . HYDROcodone-acetaminophen (NORCO) 5-325 MG per tablet Take 1 tablet by mouth every 4 (four) hours as needed for moderate pain.  Marland Kitchen insulin lispro (HUMALOG) 100 UNIT/ML injection Inject 6-10 Units into the skin 3 (three) times daily before meals. Take 10 units with breakfast and lunch.  Take 6 units with supper.  Marland Kitchen LEVEMIR FLEXTOUCH 100 UNIT/ML Pen Inject 40 Units into the skin every morning. (Patient taking differently: Inject 24 Units into the skin every morning. )  . levothyroxine (SYNTHROID, LEVOTHROID) 125 MCG tablet Take 1 tablet (125 mcg total) by mouth daily.  . magnesium oxide (MAG-OX) 400 (241.3 MG) MG tablet Take 1 tablet (400 mg total) by mouth daily.  . megestrol (MEGACE) 400 MG/10ML suspension Take 10 mLs (400 mg total) by mouth 2 (two) times daily.  . metoCLOPramide (REGLAN) 5  MG tablet Take 5 mg by mouth 3 (three) times daily before meals.   . Multiple Vitamin (MULTIVITAMIN) tablet Take 1 tablet by mouth daily.  . ondansetron (ZOFRAN ODT) 8 MG disintegrating tablet Take 1 tablet (8 mg total) by mouth every 8 (eight) hours as needed for nausea or vomiting.  . pantoprazole (PROTONIX) 40 MG tablet Take 1 tablet (40 mg total) by mouth daily.  . rosuvastatin (CRESTOR) 40 MG tablet Take 1 tablet (40 mg total) by mouth daily.  Marland Kitchen spironolactone (ALDACTONE) 25 MG tablet Take 1 tablet (25 mg total) by mouth daily.  . Vitamin D, Ergocalciferol, (DRISDOL) 50000 UNITS CAPS capsule Take 50,000 Units by mouth every Saturday.   . [DISCONTINUED] tolnaftate (TINACTIN) 1 % spray Apply 1 application topically daily.   No facility-administered encounter medications on file as of 02/17/2015.

## 2015-03-06 ENCOUNTER — Inpatient Hospital Stay (HOSPITAL_COMMUNITY)
Admission: EM | Admit: 2015-03-06 | Discharge: 2015-03-11 | DRG: 871 | Disposition: A | Discharge status: Home / Self Care | Payer: BC Managed Care – PPO | Attending: Internal Medicine | Admitting: Internal Medicine

## 2015-03-06 ENCOUNTER — Emergency Department (HOSPITAL_COMMUNITY): Payer: BC Managed Care – PPO

## 2015-03-06 ENCOUNTER — Encounter (HOSPITAL_COMMUNITY): Payer: Self-pay | Admitting: Emergency Medicine

## 2015-03-06 DIAGNOSIS — E861 Hypovolemia: Secondary | ICD-10-CM | POA: Diagnosis present

## 2015-03-06 DIAGNOSIS — D649 Anemia, unspecified: Secondary | ICD-10-CM | POA: Diagnosis present

## 2015-03-06 DIAGNOSIS — Z794 Long term (current) use of insulin: Secondary | ICD-10-CM | POA: Diagnosis not present

## 2015-03-06 DIAGNOSIS — K219 Gastro-esophageal reflux disease without esophagitis: Secondary | ICD-10-CM | POA: Diagnosis present

## 2015-03-06 DIAGNOSIS — L89312 Pressure ulcer of right buttock, stage 2: Secondary | ICD-10-CM | POA: Diagnosis present

## 2015-03-06 DIAGNOSIS — A419 Sepsis, unspecified organism: Principal | ICD-10-CM | POA: Diagnosis present

## 2015-03-06 DIAGNOSIS — E039 Hypothyroidism, unspecified: Secondary | ICD-10-CM | POA: Diagnosis present

## 2015-03-06 DIAGNOSIS — Z7982 Long term (current) use of aspirin: Secondary | ICD-10-CM

## 2015-03-06 DIAGNOSIS — Z79899 Other long term (current) drug therapy: Secondary | ICD-10-CM | POA: Diagnosis not present

## 2015-03-06 DIAGNOSIS — E0810 Diabetes mellitus due to underlying condition with ketoacidosis without coma: Secondary | ICD-10-CM | POA: Diagnosis not present

## 2015-03-06 DIAGNOSIS — I272 Other secondary pulmonary hypertension: Secondary | ICD-10-CM | POA: Diagnosis present

## 2015-03-06 DIAGNOSIS — N390 Urinary tract infection, site not specified: Secondary | ICD-10-CM | POA: Diagnosis present

## 2015-03-06 DIAGNOSIS — N342 Other urethritis: Secondary | ICD-10-CM | POA: Diagnosis not present

## 2015-03-06 DIAGNOSIS — I447 Left bundle-branch block, unspecified: Secondary | ICD-10-CM | POA: Diagnosis present

## 2015-03-06 DIAGNOSIS — I2510 Atherosclerotic heart disease of native coronary artery without angina pectoris: Secondary | ICD-10-CM | POA: Diagnosis present

## 2015-03-06 DIAGNOSIS — N179 Acute kidney failure, unspecified: Secondary | ICD-10-CM | POA: Diagnosis present

## 2015-03-06 DIAGNOSIS — L89513 Pressure ulcer of right ankle, stage 3: Secondary | ICD-10-CM | POA: Diagnosis not present

## 2015-03-06 DIAGNOSIS — Z9889 Other specified postprocedural states: Secondary | ICD-10-CM

## 2015-03-06 DIAGNOSIS — I252 Old myocardial infarction: Secondary | ICD-10-CM

## 2015-03-06 DIAGNOSIS — E1065 Type 1 diabetes mellitus with hyperglycemia: Secondary | ICD-10-CM | POA: Diagnosis present

## 2015-03-06 DIAGNOSIS — E785 Hyperlipidemia, unspecified: Secondary | ICD-10-CM | POA: Diagnosis present

## 2015-03-06 DIAGNOSIS — E1110 Type 2 diabetes mellitus with ketoacidosis without coma: Secondary | ICD-10-CM | POA: Diagnosis present

## 2015-03-06 DIAGNOSIS — Z79891 Long term (current) use of opiate analgesic: Secondary | ICD-10-CM

## 2015-03-06 DIAGNOSIS — Z96642 Presence of left artificial hip joint: Secondary | ICD-10-CM | POA: Diagnosis present

## 2015-03-06 DIAGNOSIS — I5022 Chronic systolic (congestive) heart failure: Secondary | ICD-10-CM | POA: Diagnosis present

## 2015-03-06 DIAGNOSIS — B377 Candidal sepsis: Secondary | ICD-10-CM | POA: Diagnosis not present

## 2015-03-06 DIAGNOSIS — N3000 Acute cystitis without hematuria: Secondary | ICD-10-CM | POA: Diagnosis not present

## 2015-03-06 DIAGNOSIS — E1165 Type 2 diabetes mellitus with hyperglycemia: Secondary | ICD-10-CM | POA: Diagnosis not present

## 2015-03-06 DIAGNOSIS — K3184 Gastroparesis: Secondary | ICD-10-CM | POA: Diagnosis present

## 2015-03-06 DIAGNOSIS — E876 Hypokalemia: Secondary | ICD-10-CM | POA: Diagnosis present

## 2015-03-06 DIAGNOSIS — R0602 Shortness of breath: Secondary | ICD-10-CM | POA: Diagnosis present

## 2015-03-06 DIAGNOSIS — E1010 Type 1 diabetes mellitus with ketoacidosis without coma: Secondary | ICD-10-CM | POA: Diagnosis present

## 2015-03-06 DIAGNOSIS — I10 Essential (primary) hypertension: Secondary | ICD-10-CM | POA: Diagnosis present

## 2015-03-06 DIAGNOSIS — Z95828 Presence of other vascular implants and grafts: Secondary | ICD-10-CM | POA: Insufficient documentation

## 2015-03-06 DIAGNOSIS — E1043 Type 1 diabetes mellitus with diabetic autonomic (poly)neuropathy: Secondary | ICD-10-CM | POA: Diagnosis present

## 2015-03-06 DIAGNOSIS — Z951 Presence of aortocoronary bypass graft: Secondary | ICD-10-CM | POA: Diagnosis not present

## 2015-03-06 DIAGNOSIS — E1310 Other specified diabetes mellitus with ketoacidosis without coma: Secondary | ICD-10-CM | POA: Diagnosis not present

## 2015-03-06 LAB — CBC WITH DIFFERENTIAL/PLATELET
BASOS PCT: 0 % (ref 0–1)
Basophils Absolute: 0 10*3/uL (ref 0.0–0.1)
Basophils Absolute: 0 10*3/uL (ref 0.0–0.1)
Basophils Relative: 0 % (ref 0–1)
EOS ABS: 0 10*3/uL (ref 0.0–0.7)
EOS PCT: 0 % (ref 0–5)
EOS PCT: 0 % (ref 0–5)
Eosinophils Absolute: 0 10*3/uL (ref 0.0–0.7)
HCT: 36.8 % (ref 36.0–46.0)
HEMATOCRIT: 26.8 % — AB (ref 36.0–46.0)
HEMOGLOBIN: 11.5 g/dL — AB (ref 12.0–15.0)
HEMOGLOBIN: 8.9 g/dL — AB (ref 12.0–15.0)
LYMPHS ABS: 1 10*3/uL (ref 0.7–4.0)
LYMPHS PCT: 9 % — AB (ref 12–46)
Lymphocytes Relative: 5 % — ABNORMAL LOW (ref 12–46)
Lymphs Abs: 1.1 10*3/uL (ref 0.7–4.0)
MCH: 29.8 pg (ref 26.0–34.0)
MCH: 29.8 pg (ref 26.0–34.0)
MCHC: 31.3 g/dL (ref 30.0–36.0)
MCHC: 33.2 g/dL (ref 30.0–36.0)
MCV: 95.3 fL (ref 78.0–100.0)
MCV: 89.6 fL (ref 78.0–100.0)
MONO ABS: 2 10*3/uL — AB (ref 0.1–1.0)
Monocytes Absolute: 1.1 10*3/uL — ABNORMAL HIGH (ref 0.1–1.0)
Monocytes Relative: 10 % (ref 3–12)
Monocytes Relative: 9 % (ref 3–12)
NEUTROS ABS: 10.4 10*3/uL — AB (ref 1.7–7.7)
Neutro Abs: 17.1 10*3/uL — ABNORMAL HIGH (ref 1.7–7.7)
Neutrophils Relative %: 85 % — ABNORMAL HIGH (ref 43–77)
Neutrophils Relative %: 82 % — ABNORMAL HIGH (ref 43–77)
PLATELETS: 400 10*3/uL (ref 150–400)
Platelets: ADEQUATE 10*3/uL (ref 150–400)
RBC: 3.86 MIL/uL — AB (ref 3.87–5.11)
RBC: 2.99 MIL/uL — ABNORMAL LOW (ref 3.87–5.11)
RDW: 14.4 % (ref 11.5–15.5)
RDW: 14 % (ref 11.5–15.5)
Smear Review: ADEQUATE
WBC: 20.1 10*3/uL — ABNORMAL HIGH (ref 4.0–10.5)
WBC: 12.6 10*3/uL — ABNORMAL HIGH (ref 4.0–10.5)

## 2015-03-06 LAB — CBG MONITORING, ED
GLUCOSE-CAPILLARY: 298 mg/dL — AB (ref 65–99)
Glucose-Capillary: >600 mg/dL (ref 65–99)

## 2015-03-06 LAB — COMPREHENSIVE METABOLIC PANEL
ALK PHOS: 145 U/L — AB (ref 38–126)
ALT: 16 U/L (ref 14–54)
ALT: 12 U/L — ABNORMAL LOW (ref 14–54)
ANION GAP: 15 (ref 5–15)
AST: 19 U/L (ref 15–41)
AST: 17 U/L (ref 15–41)
Albumin: 4.2 g/dL (ref 3.5–5.0)
Albumin: 3 g/dL — ABNORMAL LOW (ref 3.5–5.0)
Alkaline Phosphatase: 100 U/L (ref 38–126)
BILIRUBIN TOTAL: 0.8 mg/dL (ref 0.3–1.2)
BUN: 33 mg/dL — AB (ref 6–20)
BUN: 30 mg/dL — AB (ref 6–20)
CO2: <5 mmol/L — ABNORMAL LOW (ref 22–32)
CO2: 13 mmol/L — AB (ref 22–32)
CREATININE: 2.52 mg/dL — AB (ref 0.44–1.00)
Calcium: 9.9 mg/dL (ref 8.9–10.3)
Calcium: 8.3 mg/dL — ABNORMAL LOW (ref 8.9–10.3)
Chloride: 97 mmol/L — ABNORMAL LOW (ref 101–111)
Chloride: 111 mmol/L (ref 101–111)
Creatinine, Ser: 1.9 mg/dL — ABNORMAL HIGH (ref 0.44–1.00)
GFR calc Af Amer: 22 mL/min — ABNORMAL LOW (ref >60)
GFR calc Af Amer: 31 mL/min — ABNORMAL LOW (ref >60)
GFR, EST NON AFRICAN AMERICAN: 19 mL/min — AB (ref >60)
GFR, EST NON AFRICAN AMERICAN: 27 mL/min — AB (ref >60)
GLUCOSE: 875 mg/dL — AB (ref 65–99)
GLUCOSE: 241 mg/dL — AB (ref 65–99)
POTASSIUM: 4.9 mmol/L (ref 3.5–5.1)
POTASSIUM: 3.5 mmol/L (ref 3.5–5.1)
SODIUM: 139 mmol/L (ref 135–145)
Sodium: 135 mmol/L (ref 135–145)
TOTAL PROTEIN: 7.9 g/dL (ref 6.5–8.1)
TOTAL PROTEIN: 5.7 g/dL — AB (ref 6.5–8.1)
Total Bilirubin: 1.3 mg/dL — ABNORMAL HIGH (ref 0.3–1.2)

## 2015-03-06 LAB — LACTIC ACID, PLASMA
LACTIC ACID, VENOUS: 4.4 mmol/L — AB (ref 0.5–2.0)
LACTIC ACID, VENOUS: 1.4 mmol/L (ref 0.5–2.0)

## 2015-03-06 LAB — BASIC METABOLIC PANEL
ANION GAP: 8 (ref 5–15)
BUN: 23 mg/dL — ABNORMAL HIGH (ref 6–20)
CALCIUM: 7.6 mg/dL — AB (ref 8.9–10.3)
CO2: 16 mmol/L — ABNORMAL LOW (ref 22–32)
Chloride: 115 mmol/L — ABNORMAL HIGH (ref 101–111)
Creatinine, Ser: 1.29 mg/dL — ABNORMAL HIGH (ref 0.44–1.00)
GFR calc Af Amer: 50 mL/min — ABNORMAL LOW (ref >60)
GFR, EST NON AFRICAN AMERICAN: 43 mL/min — AB (ref >60)
GLUCOSE: 156 mg/dL — AB (ref 65–99)
POTASSIUM: 3.5 mmol/L (ref 3.5–5.1)
SODIUM: 139 mmol/L (ref 135–145)

## 2015-03-06 LAB — I-STAT VENOUS BLOOD GAS, ED
Acid-base deficit: 26 mmol/L — ABNORMAL HIGH (ref 0.0–2.0)
Bicarbonate: 4.8 mEq/L — ABNORMAL LOW (ref 20.0–24.0)
O2 SAT: 52 %
PCO2 VEN: 21.1 mmHg — AB (ref 45.0–50.0)
PH VEN: 6.961 — AB (ref 7.250–7.300)
PO2 VEN: 43 mmHg (ref 30.0–45.0)
TCO2: 5 mmol/L (ref 0–100)

## 2015-03-06 LAB — CORTISOL: CORTISOL PLASMA: 23.1 ug/dL

## 2015-03-06 LAB — BETA-HYDROXYBUTYRIC ACID: Beta-Hydroxybutyric Acid: 2.25 mmol/L — ABNORMAL HIGH (ref 0.05–0.27)

## 2015-03-06 LAB — URINE MICROSCOPIC-ADD ON

## 2015-03-06 LAB — URINALYSIS, ROUTINE W REFLEX MICROSCOPIC
Bilirubin Urine: NEGATIVE
Glucose, UA: 500 mg/dL — AB
Ketones, ur: 15 mg/dL — AB
NITRITE: POSITIVE — AB
PH: 5 (ref 5.0–8.0)
Protein, ur: 30 mg/dL — AB
SPECIFIC GRAVITY, URINE: 1.015 (ref 1.005–1.030)
Urobilinogen, UA: 0.2 mg/dL (ref 0.0–1.0)

## 2015-03-06 LAB — I-STAT TROPONIN, ED: TROPONIN I, POC: 0.04 ng/mL (ref 0.00–0.08)

## 2015-03-06 LAB — PROTIME-INR
INR: 1.18 (ref 0.00–1.49)
Prothrombin Time: 15.1 seconds (ref 11.6–15.2)

## 2015-03-06 LAB — I-STAT CG4 LACTIC ACID, ED
LACTIC ACID, VENOUS: 6.19 mmol/L — AB (ref 0.5–2.0)
Lactic Acid, Venous: 4.51 mmol/L (ref 0.5–2.0)

## 2015-03-06 LAB — PROCALCITONIN: Procalcitonin: 3.04 ng/mL

## 2015-03-06 LAB — GLUCOSE, CAPILLARY
GLUCOSE-CAPILLARY: 203 mg/dL — AB (ref 65–99)
GLUCOSE-CAPILLARY: 148 mg/dL — AB (ref 65–99)
Glucose-Capillary: 138 mg/dL — ABNORMAL HIGH (ref 65–99)

## 2015-03-06 LAB — MAGNESIUM: Magnesium: 1.3 mg/dL — ABNORMAL LOW (ref 1.7–2.4)

## 2015-03-06 LAB — MRSA PCR SCREENING: MRSA BY PCR: POSITIVE — AB

## 2015-03-06 LAB — PHOSPHORUS: Phosphorus: 3.4 mg/dL (ref 2.5–4.6)

## 2015-03-06 LAB — APTT: APTT: 20 s — AB (ref 24–37)

## 2015-03-06 MED ORDER — INSULIN GLARGINE 100 UNIT/ML ~~LOC~~ SOLN
10.0000 [IU] | SUBCUTANEOUS | Status: DC
  Administered 2015-03-07: 10 [IU] via SUBCUTANEOUS
  Filled 2015-03-06: qty 0.1

## 2015-03-06 MED ORDER — SODIUM CHLORIDE 0.9 % IV SOLN: INTRAVENOUS | Status: DC

## 2015-03-06 MED ORDER — DEXTROSE-NACL 5-0.45 % IV SOLN
INTRAVENOUS | Status: DC
  Administered 2015-03-06: 18:00:00 via INTRAVENOUS

## 2015-03-06 MED ORDER — SODIUM CHLORIDE 0.9 % IV SOLN
INTRAVENOUS | Status: DC
  Filled 2015-03-06: qty 2.5

## 2015-03-06 MED ORDER — ESCITALOPRAM OXALATE 20 MG PO TABS
20.0000 mg | ORAL_TABLET | Freq: Every day | ORAL | Status: DC
  Administered 2015-03-07 – 2015-03-11 (×5): 20 mg via ORAL
  Filled 2015-03-06 (×6): qty 1

## 2015-03-06 MED ORDER — CHLORHEXIDINE GLUCONATE CLOTH 2 % EX PADS
6.0000 | MEDICATED_PAD | Freq: Every day | CUTANEOUS | Status: AC
  Administered 2015-03-07 – 2015-03-11 (×5): 6 via TOPICAL

## 2015-03-06 MED ORDER — VANCOMYCIN HCL IN DEXTROSE 750-5 MG/150ML-% IV SOLN: 750.0000 mg | INTRAVENOUS | Status: DC

## 2015-03-06 MED ORDER — VANCOMYCIN HCL IN DEXTROSE 1-5 GM/200ML-% IV SOLN
1000.0000 mg | Freq: Once | INTRAVENOUS | Status: AC
  Administered 2015-03-06: 1000 mg via INTRAVENOUS
  Filled 2015-03-06: qty 200

## 2015-03-06 MED ORDER — DEXTROSE 5 % IV SOLN
1.0000 g | Freq: Three times a day (TID) | INTRAVENOUS | Status: DC
  Administered 2015-03-06 – 2015-03-10 (×12): 1 g via INTRAVENOUS
  Filled 2015-03-06 (×16): qty 1

## 2015-03-06 MED ORDER — CETYLPYRIDINIUM CHLORIDE 0.05 % MT LIQD
7.0000 mL | Freq: Two times a day (BID) | OROMUCOSAL | Status: DC
  Administered 2015-03-07 – 2015-03-10 (×7): 7 mL via OROMUCOSAL

## 2015-03-06 MED ORDER — METRONIDAZOLE IN NACL 5-0.79 MG/ML-% IV SOLN
500.0000 mg | Freq: Three times a day (TID) | INTRAVENOUS | Status: DC
  Administered 2015-03-07 – 2015-03-10 (×11): 500 mg via INTRAVENOUS
  Filled 2015-03-06 (×15): qty 100

## 2015-03-06 MED ORDER — SODIUM CHLORIDE 0.9 % IV BOLUS (SEPSIS)
1000.0000 mL | INTRAVENOUS | Status: AC
  Administered 2015-03-06: 1000 mL via INTRAVENOUS

## 2015-03-06 MED ORDER — HEPARIN SODIUM (PORCINE) 5000 UNIT/ML IJ SOLN
5000.0000 [IU] | Freq: Three times a day (TID) | INTRAMUSCULAR | Status: DC
  Administered 2015-03-06 – 2015-03-11 (×15): 5000 [IU] via SUBCUTANEOUS
  Filled 2015-03-06 (×18): qty 1

## 2015-03-06 MED ORDER — MUPIROCIN 2 % EX OINT
1.0000 "application " | TOPICAL_OINTMENT | Freq: Two times a day (BID) | CUTANEOUS | Status: AC
  Administered 2015-03-07 – 2015-03-11 (×10): 1 via NASAL
  Filled 2015-03-06 (×3): qty 22

## 2015-03-06 MED ORDER — INSULIN ASPART 100 UNIT/ML ~~LOC~~ SOLN: 2.0000 [IU] | SUBCUTANEOUS | Status: DC

## 2015-03-06 MED ORDER — POTASSIUM CHLORIDE 10 MEQ/100ML IV SOLN: 10.0000 meq | INTRAVENOUS | Status: DC

## 2015-03-06 MED ORDER — DEXTROSE 5 % IV SOLN
2.0000 g | Freq: Once | INTRAVENOUS | Status: DC
  Filled 2015-03-06: qty 2

## 2015-03-06 MED ORDER — CHLORHEXIDINE GLUCONATE 0.12 % MT SOLN
15.0000 mL | Freq: Two times a day (BID) | OROMUCOSAL | Status: DC
  Administered 2015-03-06 – 2015-03-11 (×9): 15 mL via OROMUCOSAL
  Filled 2015-03-06 (×13): qty 15

## 2015-03-06 MED ORDER — METRONIDAZOLE IN NACL 5-0.79 MG/ML-% IV SOLN
500.0000 mg | Freq: Once | INTRAVENOUS | Status: AC
  Administered 2015-03-06: 500 mg via INTRAVENOUS
  Filled 2015-03-06: qty 100

## 2015-03-06 MED ORDER — SODIUM CHLORIDE 0.9 % IV BOLUS (SEPSIS)
2000.0000 mL | Freq: Once | INTRAVENOUS | Status: AC
  Administered 2015-03-06: 2000 mL via INTRAVENOUS

## 2015-03-06 MED ORDER — METOCLOPRAMIDE HCL 5 MG PO TABS
5.0000 mg | ORAL_TABLET | Freq: Three times a day (TID) | ORAL | Status: DC
  Administered 2015-03-07 – 2015-03-11 (×11): 5 mg via ORAL
  Filled 2015-03-06 (×17): qty 1

## 2015-03-06 MED ORDER — SODIUM CHLORIDE 0.9 % IV SOLN
INTRAVENOUS | Status: DC
  Administered 2015-03-06: 1.4 [IU]/h via INTRAVENOUS
  Administered 2015-03-06: 5.4 [IU]/h via INTRAVENOUS
  Filled 2015-03-06: qty 2.5

## 2015-03-06 MED ORDER — LEVOTHYROXINE SODIUM 125 MCG PO TABS
125.0000 ug | ORAL_TABLET | Freq: Every day | ORAL | Status: DC
Start: 2015-03-06 — End: 2015-03-11
  Administered 2015-03-07 – 2015-03-11 (×5): 125 ug via ORAL
  Filled 2015-03-06 (×7): qty 1

## 2015-03-06 MED ORDER — POTASSIUM CHLORIDE 10 MEQ/50ML IV SOLN
10.0000 meq | INTRAVENOUS | Status: AC
  Administered 2015-03-06 (×2): 10 meq via INTRAVENOUS
  Filled 2015-03-06 (×2): qty 50

## 2015-03-06 MED ORDER — SODIUM CHLORIDE 0.9 % IV BOLUS (SEPSIS)
1000.0000 mL | Freq: Once | INTRAVENOUS | Status: AC
Start: 2015-03-06 — End: 2015-03-06
  Administered 2015-03-06: 1000 mL via INTRAVENOUS
  Administered 2015-03-06: 20:00:00 via INTRAVENOUS

## 2015-03-06 MED ORDER — DEXTROSE-NACL 5-0.45 % IV SOLN: INTRAVENOUS | Status: DC

## 2015-03-06 MED ORDER — ESCITALOPRAM OXALATE 10 MG PO TABS: 10.0000 mg | ORAL_TABLET | Freq: Every day | ORAL | Status: DC

## 2015-03-06 MED ORDER — POTASSIUM CHLORIDE 10 MEQ/100ML IV SOLN
10.0000 meq | INTRAVENOUS | Status: AC
  Filled 2015-03-06: qty 100

## 2015-03-06 NOTE — Procedures (Signed)
Central Venous Catheter Insertion Procedure Note Bridget Martinez [id number] 1950/12/11  Procedure: Insertion of Central Venous Catheter Indications: Assessment of intravascular volume, Drug and/or fluid administration and Frequent blood sampling  Procedure Details Consent: Risks of procedure as well as the alternatives and risks of each were explained to the (patient/caregiver).  Consent for procedure obtained. Time Out: Verified patient identification, verified procedure, site/side was marked, verified correct patient position, special equipment/implants available, medications/allergies/relevent history reviewed, required imaging and test results available.  Performed  Maximum sterile technique was used including antiseptics, cap, gloves, gown, hand hygiene, mask and sheet. Skin prep: Chlorhexidine; local anesthetic administered A antimicrobial bonded/coated triple lumen catheter was placed in the left internal jugular vein using the Seldinger technique.  Evaluation Blood flow good Complications: No apparent complications Patient did tolerate procedure well. Chest X-ray ordered to verify placement.  CXR: pending.  Bridget Martinez 03/06/2015, 3:36 PM  [place]  Bridget Martinez. Titus Mould, MD, [place] Pgr: [facility]

## 2015-03-06 NOTE — Progress Notes (Signed)
Notified by Microbiology that patient had positive MRSA nasal screen.

## 2015-03-06 NOTE — Progress Notes (Signed)
[facility] Progress Note Patient Name: Bridget Martinez DOB: 03-Sep-1951 MRN: [id number]   Date of Service  03/06/2015  HPI/Events of Note  AG resolved  eICU Interventions  lantus 10 , then phase 3 & SSI  diab diet, dc dextrsoe fluids, ct NS     Intervention Category Intermediate Interventions: Hyperglycemia - evaluation and treatment  Bridget Martinez V. 03/06/2015, 11:47 PM

## 2015-03-06 NOTE — ED Notes (Signed)
MDs at bedside from 1245 to 1350.   Patient bp cuff was off while they were placing central line.

## 2015-03-06 NOTE — ED Notes (Signed)
MD at bedside to place central line.

## 2015-03-06 NOTE — H&P (Signed)
PULMONARY / CRITICAL CARE MEDICINE   Name: KAYANNE GOW MRN: [id number] DOB: [birth year]    ADMISSION DATE:  Mar 26, 2015 CONSULTATION DATE:  03/26/2023  REFERRING MD :  Emelda Brothers  CHIEF COMPLAINT:  DKA  INITIAL PRESENTATION:  Bridget Martinez is a 64 yo female with a previous medical history significant for DM, right buttocks abscess, HTN, DM which has not been well controlled recently, CAD, GERD, gastroparesis, CHF, TB. She is admitted 03-26-23 with DKA and probable sepsis from a Right buttocks abscess. STUDIES:    SIGNIFICANT EVENTS: -03/26/2023 admit to ICU- DKA, sepsis protocol   HISTORY OF PRESENT ILLNESS:  Bridget Martinez is a 64 yo female admitted from the ED March 26, 2023 with intractable n/v x 2 days with loss of appetite. She has a medical history significant for  Diabetes, right buttock abscess, HTN, CAD, GERD, hypothyroidism, HFrEF 30-35% and moderate pulmonary HTN (2d echo 2/16), NSTEMI 2012, CABG 2013, venous stasis ulcers. She is also on reglan for gastroparesis. After a Left hip replacement in March 2016 she went to SNF at which point her skin breakdown caused and admission to the hospital for treatment. She has been at home on doxycycline with her daughter, who is a Copywriter, advertising changing her drsg daily. She presents Mar 26, 2023 with tachypnea, fatigue, blood glucose of 875mg /dl and elevated lactic acid greater than 6. IV access was unable to be obtained via [place] by ED staff therefore a [facility] central line was placed in order to initiate her fluid resuscitation and IV insulin regimen.   PAST MEDICAL HISTORY :   has a past medical history of Diabetes mellitus; HTN (hypertension); Hyperlipidemia; Venous stasis ulcers; Arthritis; Depression; Myocardial infarction; Hypertension; Coronary artery disease; Pneumonia; GERD (gastroesophageal reflux disease); Hypothyroidism; CHF (congestive heart failure); Tuberculosis; and Osteoporosis.  has past surgical history that includes Cardiac catheterization; Tonsillectomy;  Coronary artery bypass graft; Coronary artery bypass graft (2012); Esophagogastroduodenoscopy (N/A, 11/17/2013); Colonoscopy (N/A, 11/18/2013); right heart catheterization (N/A, 07/29/2011); left heart catheterization with coronary angiogram (N/A, 07/26/2011); and Total hip arthroplasty (Left, 11/21/2014). Prior to Admission medications   Medication Sig Start Date End Date Taking? Authorizing Provider  aspirin EC 81 MG tablet Take 1 tablet (81 mg total) by mouth daily. 12/28/14   Larey Dresser, MD  buPROPion (WELLBUTRIN XL) 150 MG 24 hr tablet Take 1 tablet (150 mg total) by mouth 2 (two) times daily. 02/17/15   Wardell Honour, MD  carvedilol (COREG) 3.125 MG tablet Take 1 tablet (3.125 mg total) by mouth 2 (two) times daily with a meal. 02/17/15   Wardell Honour, MD  doxycycline (VIBRA-TABS) 100 MG tablet Take 1 tablet (100 mg total) by mouth every 12 (twelve) hours. 02/09/15   Delfina Redwood, MD  escitalopram (LEXAPRO) 10 MG tablet Take 1 tablet (10 mg total) by mouth daily. 02/17/15   Wardell Honour, MD  ferrous sulfate 325 (65 FE) MG tablet Take 1 tablet (325 mg total) by mouth 2 (two) times daily with a meal. 11/28/14   Florencia Reasons, MD  HYDROcodone-acetaminophen (NORCO) 5-325 MG per tablet Take 1 tablet by mouth every 4 (four) hours as needed for moderate pain. 02/09/15   Delfina Redwood, MD  insulin lispro (HUMALOG) 100 UNIT/ML injection Take 10 units with breakfast and lunch and 6 at supper 02/17/15   Wardell Honour, MD  LEVEMIR FLEXTOUCH 100 UNIT/ML Pen Inject 24 Units into the skin every morning. 02/17/15   Wardell Honour, MD  levothyroxine (SYNTHROID, LEVOTHROID) 125 MCG tablet Take 1  tablet (125 mcg total) by mouth daily. 02/17/15   Wardell Honour, MD  magnesium oxide (MAG-OX) 400 (241.3 MG) MG tablet Take 1 tablet (400 mg total) by mouth daily. 11/28/14   Florencia Reasons, MD  megestrol (MEGACE) 400 MG/10ML suspension Take 10 mLs (400 mg total) by mouth 2 (two) times daily. 02/17/15   Wardell Honour, MD  metoCLOPramide (REGLAN) 5 MG tablet Take 1 tablet (5 mg total) by mouth 3 (three) times daily before meals. 02/17/15   Wardell Honour, MD  Multiple Vitamin (MULTIVITAMIN) tablet Take 1 tablet by mouth daily.    Historical Provider, MD  ondansetron (ZOFRAN ODT) 8 MG disintegrating tablet Take 1 tablet (8 mg total) by mouth every 8 (eight) hours as needed for nausea or vomiting. 02/17/15   Wardell Honour, MD  pantoprazole (PROTONIX) 40 MG tablet Take 1 tablet (40 mg total) by mouth daily. 02/17/15 10/17/17  Wardell Honour, MD  rosuvastatin (CRESTOR) 40 MG tablet Take 1 tablet (40 mg total) by mouth daily. 02/17/15   Wardell Honour, MD  spironolactone (ALDACTONE) 25 MG tablet Take 1 tablet (25 mg total) by mouth daily. 02/17/15   Wardell Honour, MD  Vitamin D, Ergocalciferol, (DRISDOL) 50000 UNITS CAPS capsule Take 50,000 Units by mouth every Saturday.  09/29/13   Historical Provider, MD   Allergies  Allergen Reactions  . Penicillins Anaphylaxis and Hives    Tolerates zosyn  . Sulfa Antibiotics Anaphylaxis and Swelling    Stiff neck also   . Macrobid [employer] Macro] Other (See Comments)    Skin peels off    FAMILY HISTORY:  indicated that her mother is deceased. She indicated that her father is deceased. She indicated that her maternal grandmother is deceased. She indicated that her maternal grandfather is deceased. She indicated that her paternal grandmother is deceased. She indicated that her paternal grandfather is deceased.  SOCIAL HISTORY:  reports that she has never smoked. She has never used smokeless tobacco. She reports that she does not drink alcohol or use illicit drugs.  REVIEW OF SYSTEMS:  General: Positive for Fatigue, denies weight loss, hemoptysis HEENT:Denies blurred vision, HA, sore throat, nasal drainage. Neuro: Denies facial droop, extremity weakness, HA. Cardiac: Denies palpitations, chest pain, chest pressure. Pulmonary: Denies SOB, cough,  sputum. GI: Positive N/V and loss of appetite. GU: Recent dysuria. Endocrine: Denies heat or cold intolerance.   SUBJECTIVE: loss of appetite, n/v, fatigue  VITAL SIGNS: Temp:  [98.8 F (37.1 C)] 98.8 F (37.1 C) (06/27 1146) Pulse Rate:  [102-126] 102 (06/27 1430) Resp:  [18-35] 18 (06/27 1430) BP: (102-132)/(29-61) 122/61 mmHg (06/27 1430) SpO2:  [100 %] 100 % (06/27 1430) Weight:  [61.689 kg (136 lb)] 61.689 kg (136 lb) (06/27 1146) HEMODYNAMICS:   VENTILATOR SETTINGS:   INTAKE / OUTPUT: No intake or output data in the 24 hours ending 03/06/15 1525  PHYSICAL EXAMINATION: General: Elderly, ill appearing female, tachypnea, lying on stretcher in [place] Neuro:  Alert and oriented follows commands. CN II-XII grossly intact HEENT:  71mm brisk PERRL [place]/AT Cardiovascular: S1 S2 audible, RRR, no rubs, gallops, or murmurs Lungs:  Diminished Anterior and posteriorly bilaterally Abdomen: soft nontender, nondistended Musculoskeletal: grossly intact Skin:  Pale, warm, dry- Right buttock ulcer approx. 3x3 cm with slight tunneling of the medial margin and erythema around wound border. No drainage or foul smell.  LABS:  CBC  Recent Labs Lab 03/06/15 1221  WBC 20.1*  HGB 11.5*  HCT 36.8  PLT  400   Coag's No results for input(s): APTT, INR in the last 168 hours. BMET  Recent Labs Lab 03/06/15 1221  NA 135  K 4.9  CL 97*  CO2 <5*  BUN 33*  CREATININE 2.52*  GLUCOSE 875*   Electrolytes  Recent Labs Lab 03/06/15 1221  CALCIUM 9.9   Sepsis Markers  Recent Labs Lab 03/06/15 1233  LATICACIDVEN 6.19*   ABG No results for input(s): PHART, PCO2ART, PO2ART in the last 168 hours. Liver Enzymes  Recent Labs Lab 03/06/15 1221  AST 19  ALT 16  ALKPHOS 145*  BILITOT 1.3*  ALBUMIN 4.2   Cardiac Enzymes No results for input(s): TROPONINI, PROBNP in the last 168 hours. Glucose  Recent Labs Lab 03/06/15 1147  GLUCAP >600*    Imaging Dg Chest Port 1  View  03/06/2015   CLINICAL DATA:  Central line placement.  EXAM: PORTABLE CHEST - 1 VIEW  COMPARISON:  November 24, 2014.  FINDINGS: The heart size and mediastinal contours are within normal limits. Status post Coronary artery bypass graft. No pneumothorax or pleural effusion is noted. Interval placement of left internal jugular catheter line with distal tip at the cavoatrial junction. Both lungs are clear. The visualized skeletal structures are unremarkable.  IMPRESSION: Interval placement of left internal jugular catheter line with distal tip at the expected position of the cavoatrial junction. No pneumothorax is noted.   Electronically Signed   By: Marijo Conception, M.D.   On: 03/06/2015 14:25     ASSESSMENT / PLAN:  PULMONARY OETT A: Tachypnea P:   Maintain sats >92 percent CXR now-completed IS while awake  CARDIOVASCULAR CVL 6/27>>> A:  Tachycardia Severe hypovolemia LBBB P:  Tele EKG-completed IVF per below  RENAL A:   AKI AG Metabolic Acidosis Lactic acidosis  P:   IVF resus for Sepsis 2ml/kg/DKA protocol Trend lactate Ck CVP to assure adequate hydration Q4hr BMP  GASTROINTESTINAL A:   GERD Gastroparesis P:   NPO for now Continue home PPI Continue home reglan  HEMATOLOGIC A:   Leukocytosis Normocytic, normochromic anemia-slight  P:  Trend fever curve Monitor CBC  INFECTIOUS A:   Sepsis- probable with right buttock wound source P:   Consider MR ABD/pelvis  BCx2 6/26>>>> UC 6/26>>>  Aztereonam 6/26>>> Metronidazole 6/26>>> Vanc 6/26>>>  ENDOCRINE A:   DKA Hyperglycemia Hypothyroid P:   IV insulin per DKA protocol Continue home synthroid Check tsh  NEUROLOGIC A:   Depression P:   Restart home meds when able to take PO RASS goal: 0   FAMILY  - Updates: Husband at bedside updated at bedside in ED  - Inter-disciplinary family meet or Palliative Care meeting due by:     TODAY'S SUMMARY: Bridget Martinez is a 64 yo female  admitted with DKA with probable precipitating event as sepsis from right buttock wound. She had a Left hip replacement in 12/2014 for which she required SNF placement for rehab where she obtained her right buttock ulcer which required one week inpatient treatment. She was on Doxy at home. She is now being treated for DKA and probable sepsis. She will receive IV insulin, IV abx, fluid resus. She may require imaging to evaluate for abscess. We will trend labs and lactate. She is being admitted to ICU for further monitoring.    03/06/2015, 3:25 PM   STAFF NOTE: I, Merrie Roof, MD FACP have personally reviewed patient's available data, including medical history, events of note, physical examination and test results as part of my  evaluation. I have discussed with resident/NP and other care providers such as pharmacist, RN and RRT. In addition, I personally evaluated patient and elicited key findings of: awake , kussmall breathing, tachy, LA concerning to be outpf proportion to DKA, concern sepsis, wound? Source?, when stablize may need further MRI, for now, sepsis protocol, LA repeat , fluid resuscitation, cvp assessment, empiric vanc, zosyn, DKA protocol, no role bicarb, may need ABG, to ICU, i updated husband The patient is critically ill with multiple organ systems failure and requires high complexity decision making for assessment and support, frequent evaluation and titration of therapies, application of advanced monitoring technologies and extensive interpretation of multiple databases.   Critical Care Time devoted to patient care services described in this note is40 Minutes. This time reflects time of care of this signee: Merrie Roof, MD FACP. This critical care time does not reflect procedure time, or teaching time or supervisory time of PA/NP/Med student/Med Resident etc but could involve care discussion time. Rest per NP/medical resident whose note is outlined above and that I agree  with   [name]. Titus Mould, MD, [place] Pgr: [facility] 03/06/2015 3:37 PM

## 2015-03-06 NOTE — ED Notes (Signed)
IV start unsuccessful x 2.   PA in the room with patient.

## 2015-03-06 NOTE — ED Notes (Signed)
IV start unsuccessful by [place], Therapist, sports.   [place], MD at bedside at this time to try and establish line.

## 2015-03-06 NOTE — Progress Notes (Signed)
ANTIBIOTIC CONSULT NOTE - INITIAL  Pharmacy Consult for vanc/aztreonam/flayl Indication: cellulitis  Allergies  Allergen Reactions  . Penicillins Anaphylaxis and Hives    Tolerates zosyn  . Sulfa Antibiotics Anaphylaxis and Swelling    Stiff neck also   . Macrobid [employer] Macro] Other (See Comments)    Skin peels off    Patient Measurements: Height: 5\' 2"  (157.5 cm) Weight: 136 lb (61.689 kg) IBW/kg (Calculated) : 50.1  Vital Signs: Temp: 98.8 F (37.1 C) (06/27 1146) Temp Source: Oral (06/27 1146) BP: 127/29 mmHg (06/27 1230) Pulse Rate: 117 (06/27 1230) Intake/Output from previous day:   Intake/Output from this shift:    Labs:  Recent Labs  03/06/15 1221  WBC 20.1*  HGB 11.5*  PLT 400  CREATININE 2.52*   Estimated Creatinine Clearance: 19.5 mL/min (by C-G formula based on Cr of 2.52). No results for input(s): VANCOTROUGH, VANCOPEAK, VANCORANDOM, GENTTROUGH, GENTPEAK, GENTRANDOM, TOBRATROUGH, TOBRAPEAK, TOBRARND, AMIKACINPEAK, AMIKACINTROU, AMIKACIN in the last 72 hours.   Microbiology: Recent Results (from the past 720 hour(s))  Wound culture     Status: None   Collection Time: 02/06/15  1:30 PM  Result Value Ref Range Status   Specimen Description WOUND RIGHT BUTTOCKS  Final   Special Requests Normal  Final   Gram Stain   Final    ABUNDANT WBC PRESENT, PREDOMINANTLY PMN NO SQUAMOUS EPITHELIAL CELLS SEEN FEW GRAM POSITIVE COCCI IN CLUSTERS Performed at [facility]    Culture   Final    MODERATE METHICILLIN RESISTANT STAPHYLOCOCCUS AUREUS Note: RIFAMPIN AND GENTAMICIN SHOULD NOT BE USED AS SINGLE DRUGS FOR TREATMENT OF STAPH INFECTIONS. This organism is presumed to be Clindamycin resistant based on detection of inducible Clindamycin resistance. LAQUITTA C. AT 9:05AM ON 02/09/15 HAJAM Performed at [facility]    Report Status 02/09/2015 FINAL  Final   Organism ID, Bacteria METHICILLIN RESISTANT STAPHYLOCOCCUS AUREUS   Final      Susceptibility   Methicillin resistant staphylococcus aureus - MIC*    CLINDAMYCIN RESISTANT      ERYTHROMYCIN RESISTANT      GENTAMICIN <=0.5 SENSITIVE Sensitive     LEVOFLOXACIN >=8 RESISTANT Resistant     OXACILLIN >=4 RESISTANT Resistant     PENICILLIN >=0.5 RESISTANT Resistant     RIFAMPIN <=0.5 SENSITIVE Sensitive     TRIMETH/SULFA <=10 SENSITIVE Sensitive     VANCOMYCIN 1 SENSITIVE Sensitive     TETRACYCLINE <=1 SENSITIVE Sensitive     * MODERATE METHICILLIN RESISTANT STAPHYLOCOCCUS AUREUS    Medical History: Past Medical History  Diagnosis Date  . Diabetes mellitus     on insulin, with h/o DKA   . HTN (hypertension)   . Hyperlipidemia   . Venous stasis ulcers   . Arthritis   . Depression     anxiety  . 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 Hypertension   . Coronary artery disease     angina.  MI.   . Pneumonia        . GERD (gastroesophageal reflux disease)     Hiatal hernia.   . Hypothyroidism   . CHF (congestive heart failure)   . Tuberculosis   . Osteoporosis    Assessment: 54 yof to ED with hyperglycemia and AMS - no MD note in yet. Pharmacy consulted to dose vanc/aztreonam/flagyl for cellulitis. Afebrile, wbc 20.1 on admit, LA 6.19. PCN allergy but tolerates Zosyn per MAR. SCr 2.52 on admit (0.91 on 6/2),  CrCl~19.  6/27 vanc>>  6/27 aztreonam>>  6/27 flagyl>>   6/27 BCx2>>  6/27 UC>>  6/27 abscess>>  6/27 wound>>   Goal of Therapy:  Vancomycin trough level 10-15 mcg/ml  Plan:  Vanc 1g IV x 1 dose in ED; then Vanc 750mg  IV q48h - f/u renal function and dose may need to be adjusted if improved  Aztreonam 1g IV q8h  Flagyl 500mg  IV q8h  Mon clinical progress, c/s, renal function, abx plan  Elicia Lamp, PharmD Clinical Pharmacist - Resident Pager [phone] 03/06/2015 2:17 PM

## 2015-03-06 NOTE — ED Notes (Signed)
Attempted report 

## 2015-03-06 NOTE — Progress Notes (Signed)
Lactic acid 4.4. Expected value will repeat per order. Sandre Kitty

## 2015-03-06 NOTE — Progress Notes (Signed)
[place] Progress Note Patient Name: Bridget Martinez DOB: 16-Jul-1951 MRN: [id number]   Date of Service  03/06/2015  HPI/Events of Note  Hypokalemia - K+ = 3.5 and Creatinine = 1.90  eICU Interventions  Hypokalemia - repleted.     Intervention Category Intermediate Interventions: Electrolyte abnormality - evaluation and management  Sommer,Steven Eugene 03/06/2015, 6:13 PM

## 2015-03-06 NOTE — ED Notes (Signed)
Per [facility], patient was confused at home and had some nausea and vomiting.  Patient answers appropriately for all questions.   Patient not confused on arrival.   Patient states has small headache, and has had some shortness of breath.   Patient CBG en route read "HIGH".    Patient does have weakness in bilateral lower extremities.

## 2015-03-06 NOTE — ED Notes (Signed)
Phlebotomy aware of blood cultures.

## 2015-03-06 NOTE — ED Provider Notes (Signed)
CSN: [id number]     Arrival date & time 03/06/15  1136 History   First MD Initiated Contact with Patient 03/06/15 1139     Chief Complaint  Patient presents with  . Altered Mental Status  . Hyperglycemia     (Consider location/radiation/quality/duration/timing/severity/associated sxs/prior Treatment) HPI Comments: Patient with a history of DM type I on insulin with history of DKA, HTN, MI, Pneumonia, CHF with most recent EF of 30-35% presents today with altered mental status and hyperglycemia.  EMS called by family after they found the patient to be confused.  She had one episode of vomiting this morning.  Patient reports mild diffuse abdominal pain.  She denies nausea, cough, SOB, fever, chills, diarrhea, or urinary symptoms.  Review of the chart shows that the patient has been admitted for DKA by Critical Care in the past.  Patient had a recent admission for a right buttock abscess and was discharged on 02/10/15.  Abscess was treated with Vancomycin and Zosyn during the hospitalization and patient was discharged on Doxycycline.  It is unclear whether or not the patient is still on the Doxycycline.  Patient reports that she has been compliant with her insulin, but has not been checking her blood sugars.    The history is provided by the patient.    Past Medical History  Diagnosis Date  . Diabetes mellitus     on insulin, with h/o DKA   . HTN (hypertension)   . Hyperlipidemia   . Venous stasis ulcers   . Arthritis   . Depression     anxiety  . 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 Hypertension   . Coronary artery disease     angina.  MI.   . Pneumonia        . GERD (gastroesophageal reflux disease)     Hiatal hernia.   . Hypothyroidism   . CHF (congestive heart failure)   . Tuberculosis   . Osteoporosis    Past Surgical History  Procedure Laterality Date  . Cardiac catheterization    . Tonsillectomy    . Coronary artery bypass  graft      Dr. Prescott Gum in 09/2011   . Coronary artery bypass graft  2012  . Esophagogastroduodenoscopy N/A 11/17/2013    Procedure: ESOPHAGOGASTRODUODENOSCOPY (EGD);  Surgeon: Lafayette Dragon, MD;  Location: [facility] ENDOSCOPY;  Service: Endoscopy;  Laterality: N/A;  . Colonoscopy N/A 11/18/2013    Procedure: COLONOSCOPY;  Surgeon: Lafayette Dragon, MD;  Location: [facility] ENDOSCOPY;  Service: Endoscopy;  Laterality: N/A;  . Right heart catheterization N/A 07/29/2011    Procedure: RIGHT HEART CATH;  Surgeon: Minus Breeding, MD;  Location: [facility] CATH LAB;  Service: Cardiovascular;  Laterality: N/A;  . Left heart catheterization with coronary angiogram N/A 07/26/2011    Procedure: LEFT HEART CATHETERIZATION WITH CORONARY ANGIOGRAM;  Surgeon: Larey Dresser, MD;  Location: [facility] CATH LAB;  Service: Cardiovascular;  Laterality: N/A;  . Total hip arthroplasty Left 11/21/2014    Procedure: TOTAL HIP ARTHROPLASTY ANTERIOR APPROACH;  Surgeon: Mcarthur Rossetti, MD;  Location: [facility];  Service: Orthopedics;  Laterality: Left;   Family History  Problem Relation Age of Onset  . Heart disease Father   . Multiple sclerosis Father   . Hypertension Mother   . Hyperthyroidism Mother   . Diabetes Cousin     Multiple maternal cousins with type 2 diabetes mellitus  . Diabetes Maternal Uncle  Type 1 diabetes mellitus  . Heart attack Paternal Grandfather   . Heart disease Paternal Grandfather    History  Substance Use Topics  . Smoking status: Never Smoker   . Smokeless tobacco: Never Used  . Alcohol Use: No   OB History    No data available     Review of Systems  All other systems reviewed and are negative.     Allergies  Penicillins; Sulfa antibiotics; and Macrobid  Home Medications   Prior to Admission medications   Medication Sig Start Date End Date Taking? Authorizing Provider  aspirin EC 81 MG tablet Take 1 tablet (81 mg total) by mouth daily. 12/28/14   Larey Dresser, MD  BAYER CONTOUR NEXT TEST  test strip 3 (three) times daily. for testing 11/04/14   Historical Provider, MD  BAYER MICROLET LANCETS lancets  01/03/15   Historical Provider, MD  buPROPion (WELLBUTRIN XL) 150 MG 24 hr tablet Take 1 tablet (150 mg total) by mouth 2 (two) times daily. 02/17/15   Wardell Honour, MD  carvedilol (COREG) 3.125 MG tablet Take 1 tablet (3.125 mg total) by mouth 2 (two) times daily with a meal. 02/17/15   Wardell Honour, MD  doxycycline (VIBRA-TABS) 100 MG tablet Take 1 tablet (100 mg total) by mouth every 12 (twelve) hours. 02/09/15   Delfina Redwood, MD  escitalopram (LEXAPRO) 10 MG tablet Take 1 tablet (10 mg total) by mouth daily. 02/17/15   Wardell Honour, MD  ferrous sulfate 325 (65 FE) MG tablet Take 1 tablet (325 mg total) by mouth 2 (two) times daily with a meal. 11/28/14   Florencia Reasons, MD  HYDROcodone-acetaminophen (NORCO) 5-325 MG per tablet Take 1 tablet by mouth every 4 (four) hours as needed for moderate pain. 02/09/15   Delfina Redwood, MD  insulin lispro (HUMALOG) 100 UNIT/ML injection Take 10 units with breakfast and lunch and 6 at supper 02/17/15   Wardell Honour, MD  LEVEMIR FLEXTOUCH 100 UNIT/ML Pen Inject 24 Units into the skin every morning. 02/17/15   Wardell Honour, MD  levothyroxine (SYNTHROID, LEVOTHROID) 125 MCG tablet Take 1 tablet (125 mcg total) by mouth daily. 02/17/15   Wardell Honour, MD  magnesium oxide (MAG-OX) 400 (241.3 MG) MG tablet Take 1 tablet (400 mg total) by mouth daily. 11/28/14   Florencia Reasons, MD  megestrol (MEGACE) 400 MG/10ML suspension Take 10 mLs (400 mg total) by mouth 2 (two) times daily. 02/17/15   Wardell Honour, MD  metoCLOPramide (REGLAN) 5 MG tablet Take 1 tablet (5 mg total) by mouth 3 (three) times daily before meals. 02/17/15   Wardell Honour, MD  Multiple Vitamin (MULTIVITAMIN) tablet Take 1 tablet by mouth daily.    Historical Provider, MD  ondansetron (ZOFRAN ODT) 8 MG disintegrating tablet Take 1 tablet (8 mg total) by mouth every 8 (eight)  hours as needed for nausea or vomiting. 02/17/15   Wardell Honour, MD  pantoprazole (PROTONIX) 40 MG tablet Take 1 tablet (40 mg total) by mouth daily. 02/17/15 10/17/17  Wardell Honour, MD  rosuvastatin (CRESTOR) 40 MG tablet Take 1 tablet (40 mg total) by mouth daily. 02/17/15   Wardell Honour, MD  spironolactone (ALDACTONE) 25 MG tablet Take 1 tablet (25 mg total) by mouth daily. 02/17/15   Wardell Honour, MD  Vitamin D, Ergocalciferol, (DRISDOL) 50000 UNITS CAPS capsule Take 50,000 Units by mouth every Saturday.  09/29/13   Historical Provider, MD  BP 102/53 mmHg  Pulse 126  Temp(Src) 98.8 F (37.1 C) (Oral)  Resp 26  Ht 5\' 2"  (1.575 m)  Wt 136 lb (61.689 kg)  BMI 24.87 kg/m2  SpO2 100% Physical Exam  Constitutional: She appears well-developed and well-nourished.  HENT:  Head: Normocephalic and atraumatic.  Eyes: EOM are normal. Pupils are equal, round, and reactive to light.  Neck: Normal range of motion. Neck supple.  Cardiovascular: Normal rate, regular rhythm and normal heart sounds.   Pulmonary/Chest: Breath sounds normal. Tachypnea noted. She has no decreased breath sounds. She has no wheezes. She has no rhonchi. She has no rales.  Labored Kussmaul breathing on exam  Abdominal: Soft. Bowel sounds are normal. She exhibits no distension and no mass. There is tenderness. There is no rebound and no guarding.  Mild diffuse tenderness to palpation  Musculoskeletal: Normal range of motion.  Neurological: She is alert.  Skin: Skin is warm and dry.  Open area of the right buttock with packing in place.  No drainage visualized.  Mild surrounding erythema.    Psychiatric: She has a normal mood and affect.  Nursing note and vitals reviewed.   ED Course  Procedures (including critical care time) Labs Review Labs Reviewed  CBG MONITORING, ED - Abnormal; Notable for the following:    Glucose-Capillary >600 (*)    All other components within normal limits  I-STAT CG4 LACTIC ACID,  ED - Abnormal; Notable for the following:    Lactic Acid, Venous 6.19 (*)    All other components within normal limits  I-STAT VENOUS BLOOD GAS, ED - Abnormal; Notable for the following:    pH, Ven 6.961 (*)    pCO2, Ven 21.1 (*)    Bicarbonate 4.8 (*)    Acid-base deficit 26.0 (*)    All other components within normal limits  WOUND CULTURE  CULTURE, ROUTINE-ABSCESS  CBC WITH DIFFERENTIAL/PLATELET  COMPREHENSIVE METABOLIC PANEL  URINALYSIS, ROUTINE W REFLEX MICROSCOPIC (NOT AT [facility])  I-STAT TROPOININ, ED    Imaging Review No results found.   EKG Interpretation   Date/Time:  Monday March 06 2015 11:47:55 EDT Ventricular Rate:  125 PR Interval:    QRS Duration: 142 QT Interval:  428 QTC Calculation: 617 R Axis:   -63 Text Interpretation:  Junctional tachycardia Left bundle branch block  Confirmed by Alvino Chapel  MD, Ovid Curd [phone]) on 03/06/2015 12:02:57 PM      CRITICAL CARE Performed by: Hyman Bible   Total critical care time: 30  Critical care time was exclusive of separately billable procedures and treating other patients.  Critical care was necessary to treat or prevent imminent or life-threatening deterioration.  Critical care was time spent personally by me on the following activities: development of treatment plan with patient and/or surrogate as well as nursing, discussions with consultants, evaluation of patient's response to treatment, examination of patient, obtaining history from patient or surrogate, ordering and performing treatments and interventions, ordering and review of laboratory studies, ordering and review of radiographic studies, pulse oximetry and re-evaluation of patient's condition.  1:20 PM Patient discussed with Critical Care who will come see the patient in the ED. MDM   Final diagnoses:  SOB (shortness of breath)   Patient with a history of DM type I currently on Insulin presents today with severe hyperglycemia with a blood sugar  >600.  Patient with Kussmaul breathing on exam.  VBG showing severe acidosis.  Patient given IVF and Glucostabilizer.  Patient is afebrile.  She is very tachycardic  on exam.  CXR and UA pending at time of admission.  Patient admitted to Critical Care for further management.      Hyman Bible, PA-C 03/08/15 0957  Davonna Belling, MD 03/08/15 2206

## 2015-03-07 DIAGNOSIS — E0810 Diabetes mellitus due to underlying condition with ketoacidosis without coma: Secondary | ICD-10-CM

## 2015-03-07 LAB — GLUCOSE, CAPILLARY
GLUCOSE-CAPILLARY: 140 mg/dL — AB (ref 65–99)
GLUCOSE-CAPILLARY: 154 mg/dL — AB (ref 65–99)
GLUCOSE-CAPILLARY: 50 mg/dL — AB (ref 65–99)
Glucose-Capillary: 136 mg/dL — ABNORMAL HIGH (ref 65–99)
Glucose-Capillary: 144 mg/dL — ABNORMAL HIGH (ref 65–99)
Glucose-Capillary: 94 mg/dL (ref 65–99)
Glucose-Capillary: 75 mg/dL (ref 65–99)
Glucose-Capillary: 110 mg/dL — ABNORMAL HIGH (ref 65–99)
Glucose-Capillary: 205 mg/dL — ABNORMAL HIGH (ref 65–99)
Glucose-Capillary: 134 mg/dL — ABNORMAL HIGH (ref 65–99)

## 2015-03-07 LAB — BASIC METABOLIC PANEL
ANION GAP: 6 (ref 5–15)
Anion gap: 6 (ref 5–15)
BUN: 19 mg/dL (ref 6–20)
BUN: 13 mg/dL (ref 6–20)
CHLORIDE: 115 mmol/L — AB (ref 101–111)
CHLORIDE: 111 mmol/L (ref 101–111)
CO2: 18 mmol/L — ABNORMAL LOW (ref 22–32)
CO2: 18 mmol/L — AB (ref 22–32)
CREATININE: 1.01 mg/dL — AB (ref 0.44–1.00)
Calcium: 7.8 mg/dL — ABNORMAL LOW (ref 8.9–10.3)
Calcium: 8.2 mg/dL — ABNORMAL LOW (ref 8.9–10.3)
Creatinine, Ser: 1.09 mg/dL — ABNORMAL HIGH (ref 0.44–1.00)
GFR calc Af Amer: >60 mL/min (ref >60)
GFR calc non Af Amer: 52 mL/min — ABNORMAL LOW (ref >60)
GFR calc non Af Amer: 58 mL/min — ABNORMAL LOW (ref >60)
GLUCOSE: 93 mg/dL (ref 65–99)
Glucose, Bld: 213 mg/dL — ABNORMAL HIGH (ref 65–99)
POTASSIUM: 3.1 mmol/L — AB (ref 3.5–5.1)
Potassium: 3.7 mmol/L (ref 3.5–5.1)
Sodium: 139 mmol/L (ref 135–145)
Sodium: 135 mmol/L (ref 135–145)

## 2015-03-07 LAB — CBC
HEMATOCRIT: 23.2 % — AB (ref 36.0–46.0)
Hemoglobin: 8 g/dL — ABNORMAL LOW (ref 12.0–15.0)
MCH: 29.7 pg (ref 26.0–34.0)
MCHC: 34.5 g/dL (ref 30.0–36.0)
MCV: 86.2 fL (ref 78.0–100.0)
PLATELETS: 281 10*3/uL (ref 150–400)
RBC: 2.69 MIL/uL — ABNORMAL LOW (ref 3.87–5.11)
RDW: 14.2 % (ref 11.5–15.5)
WBC: 8.1 10*3/uL (ref 4.0–10.5)

## 2015-03-07 MED ORDER — BUPROPION HCL ER (XL) 150 MG PO TB24
150.0000 mg | ORAL_TABLET | Freq: Two times a day (BID) | ORAL | Status: DC
  Administered 2015-03-07 – 2015-03-11 (×8): 150 mg via ORAL
  Filled 2015-03-07 (×11): qty 1

## 2015-03-07 MED ORDER — VANCOMYCIN HCL IN DEXTROSE 1-5 GM/200ML-% IV SOLN
1000.0000 mg | INTRAVENOUS | Status: DC
  Administered 2015-03-07 – 2015-03-09 (×3): 1000 mg via INTRAVENOUS
  Filled 2015-03-07 (×5): qty 200

## 2015-03-07 MED ORDER — INSULIN ASPART 100 UNIT/ML ~~LOC~~ SOLN: 0.0000 [IU] | Freq: Every day | SUBCUTANEOUS | Status: DC

## 2015-03-07 MED ORDER — SODIUM CHLORIDE 0.9 % IJ SOLN
10.0000 mL | INTRAMUSCULAR | Status: DC | PRN
  Administered 2015-03-07 – 2015-03-10 (×4): 20 mL
  Administered 2015-03-10: 10 mL
  Administered 2015-03-10: 20 mL
  Filled 2015-03-07 (×6): qty 40

## 2015-03-07 MED ORDER — INSULIN GLARGINE 100 UNIT/ML ~~LOC~~ SOLN
10.0000 [IU] | Freq: Every day | SUBCUTANEOUS | Status: DC
  Administered 2015-03-07: 10 [IU] via SUBCUTANEOUS
  Filled 2015-03-07 (×2): qty 0.1

## 2015-03-07 MED ORDER — POTASSIUM CHLORIDE CRYS ER 20 MEQ PO TBCR
40.0000 meq | EXTENDED_RELEASE_TABLET | ORAL | Status: DC
  Administered 2015-03-07: 20 meq via ORAL
  Filled 2015-03-07: qty 2

## 2015-03-07 MED ORDER — INSULIN ASPART 100 UNIT/ML ~~LOC~~ SOLN
0.0000 [IU] | Freq: Three times a day (TID) | SUBCUTANEOUS | Status: DC
  Administered 2015-03-07: 5 [IU] via SUBCUTANEOUS
  Administered 2015-03-08: 8 [IU] via SUBCUTANEOUS
  Administered 2015-03-08: 3 [IU] via SUBCUTANEOUS
  Administered 2015-03-08: 5 [IU] via SUBCUTANEOUS

## 2015-03-07 NOTE — Progress Notes (Signed)
[facility] Progress Note Patient Name: Bridget Martinez DOB: Sep 29, 1950 MRN: [id number]   Date of Service  03/07/2015  HPI/Events of Note    eICU Interventions  Hypokalemia -repleted      Intervention Category Intermediate Interventions: Electrolyte abnormality - evaluation and management  ALVA,RAKESH V. 03/07/2015, 5:24 AM

## 2015-03-07 NOTE — Progress Notes (Signed)
PULMONARY / CRITICAL CARE MEDICINE   Name: Bridget Martinez MRN: [id number] DOB: August 09, 1951    ADMISSION DATE:  03/06/2015 CONSULTATION DATE:  6/27  REFERRING MD :  Emelda Brothers  CHIEF COMPLAINT:  DKA  INITIAL PRESENTATION:  Bridget Martinez is a 64 yo female with a previous medical history significant for DM, right buttocks abscess, HTN, DM which has not been well controlled recently, CAD, GERD, gastroparesis, CHF, TB. She is admitted 6/27 with DKA and probable sepsis from a Right buttocks abscess.  STUDIES:   SIGNIFICANT EVENTS: -6/27 admit to ICU- DKA, sepsis protocol  HISTORY OF PRESENT ILLNESS:  Bridget Martinez is a 64 yo female admitted from the ED 6/27 with intractable n/v x 2 days with loss of appetite. She has a medical history significant for  Diabetes, right buttock abscess, HTN, CAD, GERD, hypothyroidism, HFrEF 30-35% and moderate pulmonary HTN (2d echo 2/16), NSTEMI 2012, CABG 2013, venous stasis ulcers. She is also on reglan for gastroparesis. After a Left hip replacement in March 2016 she went to SNF at which point her skin breakdown caused and admission to the hospital for treatment. She has been at home on doxycycline with her daughter, who is a Copywriter, advertising changing her drsg daily. She presents 6/27 with tachypnea, fatigue, blood glucose of 875mg /dl and elevated lactic acid greater than 6. IV access was unable to be obtained via [place] by ED staff therefore a [facility] central line was placed in order to initiate her fluid resuscitation and IV insulin regimen.   SUBJECTIVE:  Feeling better No emesis, but has ben NPO  VITAL SIGNS: Temp:  [97.7 F (36.5 C)-99 F (37.2 C)] 99 F (37.2 C) (06/28 0342) Pulse Rate:  [79-126] 83 (06/28 0700) Resp:  [12-35] 18 (06/28 0700) BP: (96-132)/(29-64) 103/33 mmHg (06/28 0700) SpO2:  [98 %-100 %] 98 % (06/28 0700) Weight:  [61.689 kg (136 lb)] 61.689 kg (136 lb) (06/27 1146) HEMODYNAMICS: CVP:  [6 mmHg-8 mmHg] 6 mmHg VENTILATOR SETTINGS:    INTAKE / OUTPUT:  Intake/Output Summary (Last 24 hours) at 03/07/15 0817 Last data filed at 03/07/15 [id number]  Gross per 24 hour  Intake 2994.71 ml  Output    300 ml  Net 2694.71 ml    PHYSICAL EXAMINATION: General: Elderly, ill appearing female, tachypnea, lying on stretcher in [place] Neuro:  Alert and oriented follows commands. CN II-XII grossly intact HEENT:  42mm brisk PERRL [facility]/AT Cardiovascular: S1 S2 audible, RRR, no rubs, gallops, or murmurs Lungs:  Diminished Anterior and posteriorly bilaterally Abdomen: soft nontender, nondistended Musculoskeletal: grossly intact Skin:  Pale, warm, dry- Right buttock ulcer approx. 3x3 cm with slight tunneling of the medial margin and erythema around wound border. No drainage or foul smell.  LABS:  CBC  Recent Labs Lab 03/06/15 1221 03/06/15 1653 03/07/15 0400  WBC 20.1* 12.6* 8.1  HGB 11.5* 8.9* 8.0*  HCT 36.8 26.8* 23.2*  PLT 400 PLATELETS APPEAR ADEQUATE 281   Coag's  Recent Labs Lab 03/06/15 1653  APTT 20*  INR 1.18   BMET  Recent Labs Lab 03/06/15 1653 03/06/15 2156 03/07/15 0400  NA 139 139 139  K 3.5 3.5 3.1*  CL 111 115* 115*  CO2 13* 16* 18*  BUN 30* 23* 19  CREATININE 1.90* 1.29* 1.09*  GLUCOSE 241* 156* 93   Electrolytes  Recent Labs Lab 03/06/15 1653 03/06/15 2156 03/07/15 0400  CALCIUM 8.3* 7.6* 7.8*  MG 1.3*  --   --   PHOS 3.4  --   --  Sepsis Markers  Recent Labs Lab 03/06/15 1646 03/06/15 1650 03/06/15 1653 03/06/15 2200  LATICACIDVEN 4.51* 4.4*  --  1.4  PROCALCITON  --   --  3.04  --    ABG No results for input(s): PHART, PCO2ART, PO2ART in the last 168 hours. Liver Enzymes  Recent Labs Lab 03/06/15 1221 03/06/15 1653  AST 19 17  ALT 16 12*  ALKPHOS 145* 100  BILITOT 1.3* 0.8  ALBUMIN 4.2 3.0*   Cardiac Enzymes No results for input(s): TROPONINI, PROBNP in the last 168 hours. Glucose  Recent Labs Lab 03/06/15 1940 03/06/15 2043 03/06/15 2146  03/06/15 2252 03/06/15 2353 03/07/15 0335  GLUCAP 138* 136* 154* 144* 140* 94    Imaging Dg Chest Port 1 View  03/06/2015   CLINICAL DATA:  Central line placement.  EXAM: PORTABLE CHEST - 1 VIEW  COMPARISON:  November 24, 2014.  FINDINGS: The heart size and mediastinal contours are within normal limits. Status post Coronary artery bypass graft. No pneumothorax or pleural effusion is noted. Interval placement of left internal jugular catheter line with distal tip at the cavoatrial junction. Both lungs are clear. The visualized skeletal structures are unremarkable.  IMPRESSION: Interval placement of left internal jugular catheter line with distal tip at the expected position of the cavoatrial junction. No pneumothorax is noted.   Electronically Signed   By: Marijo Conception, M.D.   On: 03/06/2015 14:25     ASSESSMENT / PLAN:  PULMONARY A: Tachypnea P:   Maintain sats >92 percent IS while awake  CARDIOVASCULAR CVL 6/27>>> A:  Tachycardia Severe hypovolemia, improving LBBB P:  Tele IVF per below  RENAL A:   AKI, improving AG Metabolic Acidosis Lactic acidosis, improved 6/28 am P:   Ck CVP to assure adequate hydration Q4hr BMP > change to q12h  GASTROINTESTINAL A:   GERD Gastroparesis P:   Start PO diet Continue home PPI Continue home reglan  HEMATOLOGIC A:   Leukocytosis, resolved Normocytic, normochromic anemia-slight  P:  Trend fever curve Monitor CBC  INFECTIOUS A:   Sepsis- probable with right buttock wound source P:   Consider MR ABD/pelvis depending on clinical course  BCx2 6/26>>>> UC 6/26>>>  Aztereonam 6/26>>> Metronidazole 6/26>>> Vanc 6/26>>>  Wound care consult pending, will involve [facility] for debridement if recommended on their eval  ENDOCRINE A:   DKA Hyperglycemia Hypothyroid P:   IV insulin per DKA protocol, titrated to off 6/27 pm Continue home synthroid Start PO nutrition and adjust insulin   NEUROLOGIC A:   Depression P:    Restart home Wellbutrin 6/28 RASS goal: 0   FAMILY  - Updates: Husband at bedside updated at bedside in ED  - Inter-disciplinary family meet or Palliative Care meeting due by: 7/4  Should be able to transfer to floor bed 6/28   Baltazar Apo, MD, PhD 03/07/2015, 8:36 AM [facility] [phone] or if no answer [phone]

## 2015-03-07 NOTE — Consult Note (Addendum)
WOC wound consult note Reason for Consult: Consult requested for right buttock wound.  Pt is familiar to [facility] team from previous admission in May; pt had an I&D performed in the ER at that time for an abscess according the EMR. Wound type: Full thickness Measurement: .5X.5X1cm with undermining to wound edges .3cm Wound bed: Narrow opening; difficult to visualize wound bed, appears to be clean and red. Drainage (amount, consistency, odor) Mod amt yellow drainage, no odor Periwound: Intact skin surrounding, no odor. Dressing procedure/placement/frequency: Continue previous plan of care which patient was using prior to admission with Aquacel packing to absorb drainage and provide antimicrobial benefits. Foam dressing to hold in place.  Pt verbalizes understanding.   Please re-consult if further assistance is needed.  Thank-you,  Julien Girt MSN, [place], [place], [place], [place]

## 2015-03-07 NOTE — Progress Notes (Signed)
CRITICAL VALUE ALERT  Critical value received:  CBG 50  Date of notification:  03/07/15  Time of notification:  0725  Critical value read back:yes  Nurse who received alert:  Birder Robson RN  MD notified (1st page):  Dr. Lamonte Sakai on unit and notified verbally. Pt treated with oral CHO- 4oz OJ. CBG rechecked in 15 min 75.

## 2015-03-07 NOTE — Plan of Care (Signed)
Problem: Phase I Progression Outcomes Goal: Nausea/vomiting controlled with antiemetics Outcome: Completed/Met Date Met:  03/07/15 No nausea or vomiting at this time.

## 2015-03-08 DIAGNOSIS — N3000 Acute cystitis without hematuria: Secondary | ICD-10-CM

## 2015-03-08 LAB — BASIC METABOLIC PANEL
Anion gap: 11 (ref 5–15)
BUN: 8 mg/dL (ref 6–20)
CHLORIDE: 105 mmol/L (ref 101–111)
CO2: 21 mmol/L — ABNORMAL LOW (ref 22–32)
CREATININE: 0.75 mg/dL (ref 0.44–1.00)
Calcium: 8.7 mg/dL — ABNORMAL LOW (ref 8.9–10.3)
GFR calc non Af Amer: >60 mL/min (ref >60)
GLUCOSE: 150 mg/dL — AB (ref 65–99)
Potassium: 3.2 mmol/L — ABNORMAL LOW (ref 3.5–5.1)
Sodium: 137 mmol/L (ref 135–145)

## 2015-03-08 LAB — URINE CULTURE

## 2015-03-08 LAB — CBC
HCT: 28.1 % — ABNORMAL LOW (ref 36.0–46.0)
Hemoglobin: 9.5 g/dL — ABNORMAL LOW (ref 12.0–15.0)
MCH: 29.4 pg (ref 26.0–34.0)
MCHC: 33.8 g/dL (ref 30.0–36.0)
MCV: 87 fL (ref 78.0–100.0)
PLATELETS: 281 10*3/uL (ref 150–400)
RBC: 3.23 MIL/uL — AB (ref 3.87–5.11)
RDW: 14.4 % (ref 11.5–15.5)
WBC: 6.9 10*3/uL (ref 4.0–10.5)

## 2015-03-08 LAB — PHOSPHORUS: PHOSPHORUS: 2.3 mg/dL — AB (ref 2.5–4.6)

## 2015-03-08 LAB — GLUCOSE, CAPILLARY
GLUCOSE-CAPILLARY: 240 mg/dL — AB (ref 65–99)
Glucose-Capillary: 160 mg/dL — ABNORMAL HIGH (ref 65–99)
Glucose-Capillary: 277 mg/dL — ABNORMAL HIGH (ref 65–99)
Glucose-Capillary: 245 mg/dL — ABNORMAL HIGH (ref 65–99)

## 2015-03-08 LAB — MAGNESIUM: Magnesium: 1.5 mg/dL — ABNORMAL LOW (ref 1.7–2.4)

## 2015-03-08 MED ORDER — MAGNESIUM SULFATE 2 GM/50ML IV SOLN
2.0000 g | Freq: Once | INTRAVENOUS | Status: AC
  Administered 2015-03-08: 2 g via INTRAVENOUS
  Filled 2015-03-08: qty 50

## 2015-03-08 MED ORDER — INSULIN DETEMIR 100 UNIT/ML ~~LOC~~ SOLN
20.0000 [IU] | Freq: Every day | SUBCUTANEOUS | Status: DC
  Filled 2015-03-08: qty 0.2

## 2015-03-08 MED ORDER — ASPIRIN EC 81 MG PO TBEC
81.0000 mg | DELAYED_RELEASE_TABLET | Freq: Every day | ORAL | Status: DC
  Administered 2015-03-08 – 2015-03-11 (×4): 81 mg via ORAL
  Filled 2015-03-08 (×4): qty 1

## 2015-03-08 MED ORDER — INSULIN ASPART 100 UNIT/ML ~~LOC~~ SOLN
0.0000 [IU] | Freq: Three times a day (TID) | SUBCUTANEOUS | Status: DC
  Administered 2015-03-09: 20 [IU] via SUBCUTANEOUS
  Administered 2015-03-09: 7 [IU] via SUBCUTANEOUS
  Administered 2015-03-09: 4 [IU] via SUBCUTANEOUS

## 2015-03-08 MED ORDER — POTASSIUM CHLORIDE CRYS ER 20 MEQ PO TBCR
40.0000 meq | EXTENDED_RELEASE_TABLET | Freq: Once | ORAL | Status: AC
  Administered 2015-03-08: 40 meq via ORAL
  Filled 2015-03-08: qty 2

## 2015-03-08 MED ORDER — CARVEDILOL 3.125 MG PO TABS
3.1250 mg | ORAL_TABLET | Freq: Two times a day (BID) | ORAL | Status: DC
  Administered 2015-03-08 – 2015-03-11 (×6): 3.125 mg via ORAL
  Filled 2015-03-08 (×8): qty 1

## 2015-03-08 MED ORDER — INSULIN ASPART 100 UNIT/ML ~~LOC~~ SOLN
0.0000 [IU] | Freq: Every day | SUBCUTANEOUS | Status: DC
  Administered 2015-03-08: 2 [IU] via SUBCUTANEOUS

## 2015-03-08 NOTE — Progress Notes (Signed)
[facility]  Patient Status: Active (receiving services up to time of hospitalization)  [facility] is providing the following services: RN and PT  If patient discharges after hours, please call [phone].   Bridget Martinez 03/08/2015, 2:00 PM

## 2015-03-08 NOTE — Progress Notes (Signed)
[place] TEAM 1 - Stepdown/ICU TEAM Progress Note  Bridget Martinez [id number] DOB: [birth year] DOA: 03/06/2015 PCP: [name], FNP  Admit HPI / Brief Narrative: 64 yo female admitted from the ED 6/27 with intractable n/v x 2 days with loss of appetite. She has a history significant forDM, right buttock abscess, HTN, CAD, GERD, hypothyroidism, Systolic CHF w/ EF 99991111 and moderate pulmonary HTN (2d echo 2/16), NSTEMI 2012, CABG 2013, venous stasis ulcers, and is on reglan for gastroparesis. After a Left hip replacement in March 2016 she went to a SNF at which point her skin breakdown caused an admission to the hospital for treatment. She had since been at home on doxycycline with her daughter changing her drsg daily. She presented 6/27 with tachypnea, fatigue, blood glucose of 875mg /dl and a lactic acid greater than 6. IV access was unable to be obtained via [place] by ED staff therefore a [facility] central line was placed in order to initiate her fluid resuscitation and IV insulin regimen.   HPI/Subjective: The patient is resting comfortably in bed.  She denies shortness of breath chest pain nausea or vomiting.  She does complain of polyuria which she states predates her admission.  There is no dysuria.  Assessment/Plan:  Sepsis due to right buttock wound +/- UTI Sepsis physiology has resolved - WOC following wound - 0.5 x 0.5 x 1 cm with undermining edges and a moderate amount of yellow drainage - aqua cell packing with foam dressing to hold in place - given the relatively benign appearance of the wound I am more suspicious that the patient's sepsis may have been due to a urinary tract infection  Positive UA Urine culture not helpful - continue empiric antibiotics for a planned 7 day course - narrow spectrum as possible  DKA in severely uncontrolled DM 2 DKA resolved but CBGs not yet controlled - adjust insulin dose and follow  Severe hypokalemia Continue to replace and follow    Hypomagnesemia Replace magnesium and follow  Left bundle branch block A chronic issue as per review of old EKGs  Lactic acidosis Resolved  Acute kidney injury Resolved with volume resuscitation  Gastroparesis Well-controlled symptomatically  Hypothyroid Continue home replacement regimen  MRSA screen positive  Code Status: FULL Family Communication: no family present at time of exam Disposition Plan: Medical bed  Consultants: PCCM >TRH   Procedures: None   Antibiotics: Aztreonam 6/27 > Metronidazole 6/27 > 6/29 Vancomycin 6/27 >  DVT prophylaxis: Subcutaneous heparin   Objective: Blood pressure 135/48, pulse 82, temperature 98.2 F (36.8 C), temperature source Oral, resp. rate 18, height 5\' 2"  (1.575 m), weight 66.3 kg (146 lb 2.6 oz), SpO2 100 %.  Intake/Output Summary (Last 24 hours) at 03/08/15 1706 Last data filed at 03/08/15 1300  Gross per 24 hour  Intake   1190 ml  Output     10 ml  Net   1180 ml   Exam: General: No acute respiratory distress Lungs: Clear to auscultation bilaterally without wheezes or crackles Cardiovascular: Regular rate and rhythm without murmur gallop or rub normal S1 and S2 Abdomen: Nontender, nondistended, soft, bowel sounds positive, no rebound, no ascites, no appreciable mass Extremities: No significant cyanosis, clubbing, or edema bilateral lower extremities  Data Reviewed: Basic Metabolic Panel:  Recent Labs Lab 03/06/15 1653 03/06/15 2156 03/07/15 0400 03/07/15 1710 03/08/15 0510  NA 139 139 139 135 137  K 3.5 3.5 3.1* 3.7 3.2*  CL 111 115* 115* 111 105  CO2 13* 16* 18*  18* 21*  GLUCOSE 241* 156* 93 213* 150*  BUN 30* 23* 19 13 8   CREATININE 1.90* 1.29* 1.09* 1.01* 0.75  CALCIUM 8.3* 7.6* 7.8* 8.2* 8.7*  MG 1.3*  --   --   --  1.5*  PHOS 3.4  --   --   --  2.3*   CBC:  Recent Labs Lab 03/06/15 1221 03/06/15 1653 03/07/15 0400 03/08/15 0510  WBC 20.1* 12.6* 8.1 6.9  NEUTROABS 17.1* 10.4*  --   --    HGB 11.5* 8.9* 8.0* 9.5*  HCT 36.8 26.8* 23.2* 28.1*  MCV 95.3 89.6 86.2 87.0  PLT 400 PLATELETS APPEAR ADEQUATE 281 281   Liver Function Tests:  Recent Labs Lab 03/06/15 1221 03/06/15 1653  AST 19 17  ALT 16 12*  ALKPHOS 145* 100  BILITOT 1.3* 0.8  PROT 7.9 5.7*  ALBUMIN 4.2 3.0*   Coags:  Recent Labs Lab 03/06/15 1653  INR 1.18    Recent Labs Lab 03/06/15 1653  APTT 20*   CBG:  Recent Labs Lab 03/07/15 1136 03/07/15 1638 03/07/15 2148 03/08/15 0812 03/08/15 1153  GLUCAP 110* 205* 134* 160* 277*    Recent Results (from the past 240 hour(s))  Culture, routine-abscess     Status: None (Preliminary result)   Collection Time: 03/06/15 12:38 PM  Result Value Ref Range Status   Specimen Description ABSCESS BUTTOCKS  Final   Special Requests Normal  Final   Gram Stain   Final    NO WBC SEEN NO SQUAMOUS EPITHELIAL CELLS SEEN RARE GRAM POSITIVE RODS Performed at [facility]    Culture   Final    MODERATE DIPHTHEROIDS(CORYNEBACTERIUM SPECIES) Note: Standardized susceptibility testing for this organism is not available. Performed at [facility]    Report Status PENDING  Incomplete  Blood culture (routine x 2)     Status: None (Preliminary result)   Collection Time: 03/06/15  2:25 PM  Result Value Ref Range Status   Specimen Description BLOOD RIGHT ANTECUBITAL  Final   Special Requests BOTTLES DRAWN AEROBIC ONLY 3CC  Final   Culture NO GROWTH 2 DAYS  Final   Report Status PENDING  Incomplete  Blood culture (routine x 2)     Status: None (Preliminary result)   Collection Time: 03/06/15  2:59 PM  Result Value Ref Range Status   Specimen Description BLOOD LEFT NECK  Final   Special Requests BOTTLES DRAWN AEROBIC AND ANAEROBIC 4ML  Final   Culture NO GROWTH 2 DAYS  Final   Report Status PENDING  Incomplete  MRSA PCR Screening     Status: Abnormal   Collection Time: 03/06/15  5:26 PM  Result Value Ref Range Status   MRSA by PCR  POSITIVE (A) NEGATIVE Final    Comment:        The GeneXpert MRSA Assay (FDA approved for NASAL specimens only), is one component of a comprehensive MRSA colonization surveillance program. It is not intended to diagnose MRSA infection nor to guide or monitor treatment for MRSA infections. RESULT CALLED TO, READ BACK BY AND VERIFIED WITH: D TURNER RN 2034 03/06/15 A BROWNING   Culture, blood (x 2)     Status: None (Preliminary result)   Collection Time: 03/06/15  6:07 PM  Result Value Ref Range Status   Specimen Description BLOOD BLOOD LEFT FOREARM  Final   Special Requests BOTTLES DRAWN AEROBIC AND ANAEROBIC 10 EACH  Final   Culture NO GROWTH 2 DAYS  Final   Report  Status PENDING  Incomplete  Culture, blood (x 2)     Status: None (Preliminary result)   Collection Time: 03/06/15  6:23 PM  Result Value Ref Range Status   Specimen Description BLOOD LEFT FOREARM  Final   Special Requests AEROBIC BOTTLE ONLY 1CC  Final   Culture NO GROWTH 2 DAYS  Final   Report Status PENDING  Incomplete  Urine culture     Status: None   Collection Time: 03/06/15  9:58 PM  Result Value Ref Range Status   Specimen Description IN/OUT CATH URINE  Final   Special Requests NONE  Final   Culture   Final    MULTIPLE SPECIES PRESENT, SUGGEST RECOLLECTION IF CLINICALLY INDICATED   Report Status 03/08/2015 FINAL  Final     Studies:   Recent x-ray studies have been reviewed in detail by the Attending Physician  Scheduled Meds:  Scheduled Meds: . antiseptic oral rinse  7 mL Mouth Rinse q12n4p  . aztreonam  1 g Intravenous 3 times per day  . buPROPion  150 mg Oral BID  . chlorhexidine  15 mL Mouth Rinse BID  . Chlorhexidine Gluconate Cloth  6 each Topical Q0600  . escitalopram  20 mg Oral Daily  . heparin  5,000 Units Subcutaneous 3 times per day  . insulin aspart  0-15 Units Subcutaneous TID WC  . insulin aspart  0-5 Units Subcutaneous QHS  . insulin glargine  10 Units Subcutaneous QHS  .  levothyroxine  125 mcg Oral Daily  . metoCLOPramide  5 mg Oral TID AC  . metronidazole  500 mg Intravenous Q8H  . mupirocin ointment  1 application Nasal BID  . vancomycin  1,000 mg Intravenous Q24H    Time spent on care of this patient: 35 mins   Dollie Mayse T , MD   [facility] Office  [phone] Pager - Text Page per Shea Evans as per below:  On-Call/Text Page:      [email or web address]      password [email or web address]  If 7PM-7AM, please contact night-coverage [email or web address] Password [email or web address] 03/08/2015, 5:06 PM   LOS: 2 days

## 2015-03-09 DIAGNOSIS — B377 Candidal sepsis: Secondary | ICD-10-CM

## 2015-03-09 DIAGNOSIS — N342 Other urethritis: Secondary | ICD-10-CM

## 2015-03-09 DIAGNOSIS — L89513 Pressure ulcer of right ankle, stage 3: Secondary | ICD-10-CM

## 2015-03-09 DIAGNOSIS — E1310 Other specified diabetes mellitus with ketoacidosis without coma: Secondary | ICD-10-CM

## 2015-03-09 LAB — GLUCOSE, CAPILLARY
GLUCOSE-CAPILLARY: 477 mg/dL — AB (ref 65–99)
GLUCOSE-CAPILLARY: 163 mg/dL — AB (ref 65–99)
Glucose-Capillary: 355 mg/dL — ABNORMAL HIGH (ref 65–99)
Glucose-Capillary: 306 mg/dL — ABNORMAL HIGH (ref 65–99)
Glucose-Capillary: 72 mg/dL (ref 65–99)

## 2015-03-09 LAB — BASIC METABOLIC PANEL
ANION GAP: 10 (ref 5–15)
BUN: 10 mg/dL (ref 6–20)
CHLORIDE: 102 mmol/L (ref 101–111)
CO2: 20 mmol/L — ABNORMAL LOW (ref 22–32)
Calcium: 8.4 mg/dL — ABNORMAL LOW (ref 8.9–10.3)
Creatinine, Ser: 0.81 mg/dL (ref 0.44–1.00)
GFR calc non Af Amer: >60 mL/min (ref >60)
Glucose, Bld: 329 mg/dL — ABNORMAL HIGH (ref 65–99)
Potassium: 4.5 mmol/L (ref 3.5–5.1)
SODIUM: 132 mmol/L — AB (ref 135–145)

## 2015-03-09 LAB — CULTURE, ROUTINE-ABSCESS
Gram Stain: NONE SEEN
Special Requests: NORMAL

## 2015-03-09 LAB — CBC
HCT: 28.3 % — ABNORMAL LOW (ref 36.0–46.0)
HEMOGLOBIN: 9.6 g/dL — AB (ref 12.0–15.0)
MCH: 30.1 pg (ref 26.0–34.0)
MCHC: 33.9 g/dL (ref 30.0–36.0)
MCV: 88.7 fL (ref 78.0–100.0)
Platelets: 260 10*3/uL (ref 150–400)
RBC: 3.19 MIL/uL — ABNORMAL LOW (ref 3.87–5.11)
RDW: 14.5 % (ref 11.5–15.5)
WBC: 5.8 10*3/uL (ref 4.0–10.5)

## 2015-03-09 MED ORDER — INSULIN DETEMIR 100 UNIT/ML ~~LOC~~ SOLN
35.0000 [IU] | Freq: Every day | SUBCUTANEOUS | Status: DC
  Administered 2015-03-09: 35 [IU] via SUBCUTANEOUS
  Filled 2015-03-09 (×2): qty 0.35

## 2015-03-09 MED ORDER — INSULIN ASPART 100 UNIT/ML ~~LOC~~ SOLN
15.0000 [IU] | Freq: Once | SUBCUTANEOUS | Status: AC
  Administered 2015-03-09: 15 [IU] via SUBCUTANEOUS

## 2015-03-09 NOTE — Evaluation (Signed)
Physical Therapy Evaluation Patient Details Name: Bridget Martinez MRN: [id number] DOB: Aug 31, 1951 Today's Date: 03/09/2015   History of Present Illness  64 yo female admitted from the ED 6/27 with intractable n/v x 2 days with loss of appetite. She has a history significant forDM, right buttock abscess, HTN, CAD, GERD, hypothyroidism, Systolic CHF w/ EF 99991111 and moderate pulmonary HTN (2d echo 2/16), NSTEMI 2012, CABG 2013, venous stasis ulcers, and is on reglan for gastroparesis. After a Left hip replacement in March 2016 she went to a SNF at which point her skin breakdown caused an admission to the hospital for treatment. She had since been at home on doxycycline with her daughter changing her drsg daily. She presented 6/27 with tachypnea, fatigue, blood glucose of 875mg /dl and a lactic acid greater than 6.   Clinical Impression  Pt admitted with above diagnosis. Pt currently with functional limitations due to the deficits listed below (see PT Problem List).  Pt will benefit from skilled PT to increase their independence and safety with mobility to allow discharge to the venue listed below.  Pt with decreased gait pattern compared to PLOF. Pt has all DME and 24 hour A from husband and daughter.  Recommend continuing HHPT.       Follow Up Recommendations Home health PT    Equipment Recommendations  None recommended by PT    Recommendations for Other Services       Precautions / Restrictions Precautions Precautions: Fall Restrictions Weight Bearing Restrictions: No      Mobility  Bed Mobility Overal bed mobility: Needs Assistance Bed Mobility: Supine to Sit     Supine to sit: Min assist;Mod assist     General bed mobility comments: MIN A for supine to sit and MOD A for scootnig to EOB with use of bed pad.  Pt unable to coordinate.  Normally sleeps in lift chair.  Transfers Overall transfer level: Needs assistance Equipment used: Rolling walker (2 wheeled) Transfers: Sit  to/from [facility] Sit to Stand: Min guard;Min assist Stand pivot transfers: Min guard       General transfer comment: MIN A from  bed x 1 rep and min/guard from [facility] and bed another rep  Ambulation/Gait Ambulation/Gait assistance: Min assist Ambulation Distance (Feet): 5 Feet Assistive device: Rolling walker (2 wheeled) Gait Pattern/deviations: Step-to pattern;Decreased step length - left;Shuffle     General Gait Details: Amb 5 feet forward and then backward with step to pattern and shuffeled gait pattern. Pt with some SOB with gait which pt's husband states is normal.    Science writer    Modified Rankin (Stroke Patients Only)       Balance Overall balance assessment: Needs assistance         Standing balance support: Bilateral upper extremity supported;During functional activity Standing balance-Leahy Scale: Poor Standing balance comment: Needs UE support.                               Pertinent Vitals/Pain Pain Assessment: No/denies pain    Home Living Family/patient expects to be discharged to:: Private residence Living Arrangements: Spouse/significant other Available Help at Discharge: Family;Available 24 hours/day (husband and then daughter comes and assists at times) Type of Home: House Home Access: [place]: One level Home Equipment: [place] - 2 wheels;Walker - 4 wheels;Bedside commode;Wheelchair - manual (lift  chair) Additional Comments: Pt sleeps in lift chair.  Husband reports due to set up of house pt has to walk 20 feet without AD for toileting.    Prior Function Level of Independence: Independent with assistive device(s)         Comments: Was receiving HHPT.     Hand Dominance        Extremity/Trunk Assessment   Upper Extremity Assessment: Defer to OT evaluation           Lower Extremity Assessment: Generalized weakness         Communication    Communication: No difficulties  Cognition Arousal/Alertness: Awake/alert Behavior During Therapy: Flat affect Overall Cognitive Status: Within Functional Limits for tasks assessed (not oriented to month)                      General Comments General comments (skin integrity, edema, etc.): Husband present and states he feels they have everything they need to take care of her.  Pt with skin breakdown on buttocks.    Exercises        Assessment/Plan    PT Assessment Patient needs continued PT services  PT Diagnosis Difficulty walking;Generalized weakness   PT Problem List Decreased strength;Decreased activity tolerance;Decreased balance;Decreased mobility  PT Treatment Interventions Gait training;Functional mobility training;Therapeutic activities;Therapeutic exercise;Balance training;Patient/family education   PT Goals (Current goals can be found in the Care Plan section) Acute Rehab PT Goals Patient Stated Goal: return home PT Goal Formulation: With patient/family Time For Goal Achievement: 03/23/15 Potential to Achieve Goals: Good    Frequency Min 3X/week   Barriers to discharge        Co-evaluation               End of Session Equipment Utilized During Treatment: Gait belt Activity Tolerance: Patient limited by fatigue Patient left: in chair;with call bell/phone within reach;with family/visitor present           Time: [id number] PT Time Calculation (min) (ACUTE ONLY): 32 min   Charges:   PT Evaluation $Initial PT Evaluation Tier I: 1 Procedure PT Treatments $Gait Training: 8-22 mins   PT G Codes:        Tarin Johndrow LUBECK 03/09/2015, 11:13 AM

## 2015-03-09 NOTE — Progress Notes (Signed)
[place] TEAM 1 - Stepdown/ICU TEAM Progress Note  Bridget Martinez [id number] DOB: 11/16/1950 DOA: 03/06/2015 PCP: [name], FNP  Admit HPI / Brief Narrative: 64 yo female admitted from the ED 6/27 with intractable n/v x 2 days with loss of appetite. She has a history significant forDM, right buttock abscess, HTN, CAD, GERD, hypothyroidism, Systolic CHF w/ EF 99991111 and moderate pulmonary HTN (2d echo 2/16), NSTEMI 2012, CABG 2013, venous stasis ulcers, and is on reglan for gastroparesis. After a Left hip replacement in March 2016 she went to a SNF at which point her skin breakdown caused an admission to the hospital for treatment. She had since been at home on doxycycline with her daughter changing her drsg daily. She presented 6/27 with tachypnea, fatigue, blood glucose of 875mg /dl and a lactic acid greater than 6. IV access was unable to be obtained via [place] by ED staff therefore a [facility] central line was placed in order to initiate her fluid resuscitation and IV insulin regimen.   HPI/Subjective:  The patient is resting comfortably in bed.  She denies shortness of breath chest pain nausea or vomiting.  She does complain of polyuria which she states predates her admission.  There is no dysuria.Denies any focal weakness.  Assessment/Plan:  Sepsis due to right buttock wound +/- UTI Sepsis physiology has resolved - WOC following wound - 0.5 x 0.5 x 1 cm with undermining edges and a moderate amount of yellow drainage - aqua cell packing with foam dressing to hold in place - given the relatively benign appearance of the wound I am more suspicious that the patient's sepsis may have been due to a urinary tract infection, follow urine cultures, wound culture growing diphtheroid species and she is on Flagyl and aztreonam. Clinically better. Increase activity, initiate PT and monitor.  Positive UA Urine culture not helpful - continue empiric antibiotics for a planned 7 day course - narrow  spectrum as possible  Severe hypokalemia Replaced and stable  Hypomagnesemia Replaced magnesium and follow  Left bundle branch block A chronic issue as per review of old EKGs  Lactic acidosis Resolved  Acute kidney injury Resolved with volume resuscitation  DM Gastroparesis Well-controlled with reglan  Hypothyroid Continue home replacement regimen   DKA in severely uncontrolled DM 2 DKA resolved but CBGs not yet controlled - adjusted Lantus, continue sliding scale and monitor  CBG (last 3)   Recent Labs  03/08/15 1653 03/08/15 2135 03/09/15 0728  GLUCAP 245* 240* 355*      MRSA screen positive  Code Status: FULL Family Communication: no family present at time of exam Disposition Plan: Medical bed  Consultants: PCCM >TRH   Procedures: None   Anti-infectives    Start     Dose/Rate Route Frequency Ordered Stop   03/08/15 1400  vancomycin (VANCOCIN) IVPB 750 mg/150 ml premix  Status:  Discontinued     750 mg 150 mL/hr over 60 Minutes Intravenous Every 48 hours 03/06/15 1413 03/07/15 1601   03/07/15 2000  vancomycin (VANCOCIN) IVPB 1000 mg/200 mL premix     1,000 mg 200 mL/hr over 60 Minutes Intravenous Every 24 hours 03/07/15 1601     03/06/15 2200  metroNIDAZOLE (FLAGYL) IVPB 500 mg     500 mg 100 mL/hr over 60 Minutes Intravenous Every 8 hours 03/06/15 1413     03/06/15 1415  aztreonam (AZACTAM) 1 g in dextrose 5 % 50 mL IVPB     1 g 100 mL/hr over 30 Minutes Intravenous 3  times per day 03/06/15 1409     03/06/15 1400  aztreonam (AZACTAM) 2 g in dextrose 5 % 50 mL IVPB  Status:  Discontinued     2 g 100 mL/hr over 30 Minutes Intravenous  Once 03/06/15 1353 03/06/15 1409   03/06/15 1400  metroNIDAZOLE (FLAGYL) IVPB 500 mg     500 mg 100 mL/hr over 60 Minutes Intravenous  Once 03/06/15 1353 03/06/15 1919   03/06/15 1400  vancomycin (VANCOCIN) IVPB 1000 mg/200 mL premix     1,000 mg 200 mL/hr over 60 Minutes Intravenous  Once 03/06/15 1353 03/06/15  2100      DVT prophylaxis: Subcutaneous heparin   Objective: Blood pressure 117/49, pulse 85, temperature 98.2 F (36.8 C), temperature source Oral, resp. rate 20, height 5\' 2"  (1.575 m), weight 63.64 kg (140 lb 4.8 oz), SpO2 98 %.  Intake/Output Summary (Last 24 hours) at 03/09/15 1046 Last data filed at 03/09/15 0900  Gross per 24 hour  Intake    840 ml  Output     12 ml  Net    828 ml   Exam: General: No acute respiratory distress Lungs: Clear to auscultation bilaterally without wheezes or crackles Cardiovascular: Regular rate and rhythm without murmur gallop or rub normal S1 and S2 Abdomen: Nontender, nondistended, soft, bowel sounds positive, no rebound, no ascites, no appreciable mass Extremities: No significant cyanosis, clubbing, or edema bilateral lower extremities  Data Reviewed: Basic Metabolic Panel:  Recent Labs Lab 03/06/15 1653 03/06/15 2156 03/07/15 0400 03/07/15 1710 03/08/15 0510 03/09/15 0430  NA 139 139 139 135 137 132*  K 3.5 3.5 3.1* 3.7 3.2* 4.5  CL 111 115* 115* 111 105 102  CO2 13* 16* 18* 18* 21* 20*  GLUCOSE 241* 156* 93 213* 150* 329*  BUN 30* 23* 19 13 8 10   CREATININE 1.90* 1.29* 1.09* 1.01* 0.75 0.81  CALCIUM 8.3* 7.6* 7.8* 8.2* 8.7* 8.4*  MG 1.3*  --   --   --  1.5*  --   PHOS 3.4  --   --   --  2.3*  --    CBC:  Recent Labs Lab 03/06/15 1221 03/06/15 1653 03/07/15 0400 03/08/15 0510 03/09/15 0430  WBC 20.1* 12.6* 8.1 6.9 5.8  NEUTROABS 17.1* 10.4*  --   --   --   HGB 11.5* 8.9* 8.0* 9.5* 9.6*  HCT 36.8 26.8* 23.2* 28.1* 28.3*  MCV 95.3 89.6 86.2 87.0 88.7  PLT 400 PLATELETS APPEAR ADEQUATE 281 281 260   Liver Function Tests:  Recent Labs Lab 03/06/15 1221 03/06/15 1653  AST 19 17  ALT 16 12*  ALKPHOS 145* 100  BILITOT 1.3* 0.8  PROT 7.9 5.7*  ALBUMIN 4.2 3.0*   Coags:  Recent Labs Lab 03/06/15 1653  INR 1.18    Recent Labs Lab 03/06/15 1653  APTT 20*   CBG:  Recent Labs Lab 03/08/15 0812  03/08/15 1153 03/08/15 1653 03/08/15 2135 03/09/15 0728  GLUCAP 160* 277* 245* 240* 355*    Recent Results (from the past 240 hour(s))  Culture, routine-abscess     Status: None   Collection Time: 03/06/15 12:38 PM  Result Value Ref Range Status   Specimen Description ABSCESS BUTTOCKS  Final   Special Requests Normal  Final   Gram Stain   Final    NO WBC SEEN NO SQUAMOUS EPITHELIAL CELLS SEEN RARE GRAM POSITIVE RODS Performed at [facility]    Culture   Final    MODERATE DIPHTHEROIDS(CORYNEBACTERIUM  SPECIES) Note: Standardized susceptibility testing for this organism is not available. Performed at [facility]    Report Status 03/09/2015 FINAL  Final  Blood culture (routine x 2)     Status: None (Preliminary result)   Collection Time: 03/06/15  2:25 PM  Result Value Ref Range Status   Specimen Description BLOOD RIGHT ANTECUBITAL  Final   Special Requests BOTTLES DRAWN AEROBIC ONLY 3CC  Final   Culture NO GROWTH 2 DAYS  Final   Report Status PENDING  Incomplete  Blood culture (routine x 2)     Status: None (Preliminary result)   Collection Time: 03/06/15  2:59 PM  Result Value Ref Range Status   Specimen Description BLOOD LEFT NECK  Final   Special Requests BOTTLES DRAWN AEROBIC AND ANAEROBIC 4ML  Final   Culture NO GROWTH 2 DAYS  Final   Report Status PENDING  Incomplete  MRSA PCR Screening     Status: Abnormal   Collection Time: 03/06/15  5:26 PM  Result Value Ref Range Status   MRSA by PCR POSITIVE (A) NEGATIVE Final    Comment:        The GeneXpert MRSA Assay (FDA approved for NASAL specimens only), is one component of a comprehensive MRSA colonization surveillance program. It is not intended to diagnose MRSA infection nor to guide or monitor treatment for MRSA infections. RESULT CALLED TO, READ BACK BY AND VERIFIED WITH: D TURNER RN 2034 03/06/15 A BROWNING   Culture, blood (x 2)     Status: None (Preliminary result)   Collection Time:  03/06/15  6:07 PM  Result Value Ref Range Status   Specimen Description BLOOD BLOOD LEFT FOREARM  Final   Special Requests BOTTLES DRAWN AEROBIC AND ANAEROBIC 10 EACH  Final   Culture NO GROWTH 2 DAYS  Final   Report Status PENDING  Incomplete  Culture, blood (x 2)     Status: None (Preliminary result)   Collection Time: 03/06/15  6:23 PM  Result Value Ref Range Status   Specimen Description BLOOD LEFT FOREARM  Final   Special Requests AEROBIC BOTTLE ONLY 1CC  Final   Culture NO GROWTH 2 DAYS  Final   Report Status PENDING  Incomplete  Urine culture     Status: None   Collection Time: 03/06/15  9:58 PM  Result Value Ref Range Status   Specimen Description IN/OUT CATH URINE  Final   Special Requests NONE  Final   Culture   Final    MULTIPLE SPECIES PRESENT, SUGGEST RECOLLECTION IF CLINICALLY INDICATED   Report Status 03/08/2015 FINAL  Final     Studies:   Recent x-ray studies have been reviewed    Scheduled Meds:  Scheduled Meds: . antiseptic oral rinse  7 mL Mouth Rinse q12n4p  . aspirin EC  81 mg Oral Daily  . aztreonam  1 g Intravenous 3 times per day  . buPROPion  150 mg Oral BID  . carvedilol  3.125 mg Oral BID WC  . chlorhexidine  15 mL Mouth Rinse BID  . Chlorhexidine Gluconate Cloth  6 each Topical Q0600  . escitalopram  20 mg Oral Daily  . heparin  5,000 Units Subcutaneous 3 times per day  . insulin aspart  0-20 Units Subcutaneous TID WC  . insulin aspart  0-5 Units Subcutaneous QHS  . insulin detemir  35 Units Subcutaneous Daily  . levothyroxine  125 mcg Oral Daily  . metoCLOPramide  5 mg Oral TID AC  . metronidazole  500 mg Intravenous Q8H  . mupirocin ointment  1 application Nasal BID  . vancomycin  1,000 mg Intravenous Q24H    Time spent on care of this patient: 35 mins  Sevin Langenbach K M.D on 03/09/2015 at 10:46 AM  Between 7am to 7pm - Pager - [phone], After 7pm go to [email or web address] - password [facility]  [facility]  Office   [phone]    LOS: 3 days

## 2015-03-09 NOTE — Progress Notes (Signed)
ANTIBIOTIC CONSULT NOTE - INITIAL  Pharmacy Consult for vanc/aztreonam/flayl Indication: cellulitis  Allergies  Allergen Reactions  . Penicillins Anaphylaxis and Hives    Tolerates zosyn  . Sulfa Antibiotics Anaphylaxis and Swelling    Stiff neck also   . Macrobid [employer] Macro] Other (See Comments)    Skin peels off    Patient Measurements: Height: 5\' 2"  (157.5 cm) Weight: 140 lb 4.8 oz (63.64 kg) IBW/kg (Calculated) : 50.1  Vital Signs: Temp: 98.2 F (36.8 C) (06/30 0900) Temp Source: Oral (06/30 0900) BP: 117/49 mmHg (06/30 0900) Pulse Rate: 85 (06/30 0900) Intake/Output from previous day: 06/29 0701 - 06/30 0700 In: 840 [P.O.:840] Out: 12 [Urine:11; Stool:1] Intake/Output from this shift: Total I/O In: 120 [P.O.:120] Out: -   Labs:  Recent Labs  03/07/15 0400 03/07/15 1710 03/08/15 0510 03/09/15 0430  WBC 8.1  --  6.9 5.8  HGB 8.0*  --  9.5* 9.6*  PLT 281  --  281 260  CREATININE 1.09* 1.01* 0.75 0.81   Estimated Creatinine Clearance: 61.5 mL/min (by C-G formula based on Cr of 0.81). No results for input(s): VANCOTROUGH, VANCOPEAK, VANCORANDOM, GENTTROUGH, GENTPEAK, GENTRANDOM, TOBRATROUGH, TOBRAPEAK, TOBRARND, AMIKACINPEAK, AMIKACINTROU, AMIKACIN in the last 72 hours.   Medical History: Past Medical History  Diagnosis Date  . Diabetes mellitus     on insulin, with h/o DKA   . HTN (hypertension)   . Hyperlipidemia   . Venous stasis ulcers   . Arthritis   . Depression     anxiety  . 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 Hypertension   . Coronary artery disease     angina.  MI.   . Pneumonia        . GERD (gastroesophageal reflux disease)     Hiatal hernia.   . Hypothyroidism   . CHF (congestive heart failure)   . Tuberculosis   . Osteoporosis    Assessment: 64 yo female on abx for cellulitis and sepsis, source likely due to sacral wound. Was on doxycycline PTA. WBC 5.8,  afebrile, VSS. On day #4 vanc/aztreonam/flagyl.  6/27 vanc>>  6/27 aztreonam>>  6/27 flagyl>>   6/27 BCx2>> ngtd 6/27 UC>> multiple species 6/27 abscess>> corynebacterium   Goal of Therapy:  Vancomycin trough level 10-15 mcg/ml  Plan:  Vancomycin 1 g IV q24h Aztreonam 1g IV q8h  Flagyl 500mg  IV q8h  Mon clinical progress, c/s, renal function, abx plan VT as needed Would narrow abx to PO, consider Augmentin    Hughes Better, PharmD, BCPS Clinical Pharmacist Pager: [phone] 03/09/2015 2:26 PM

## 2015-03-09 NOTE — Evaluation (Signed)
Occupational Therapy Evaluation Patient Details Name: Bridget Martinez MRN: [id number] DOB: [birth year] Today's Date: 03/09/2015    History of Present Illness 64 yo female admitted from the ED 6/27 with intractable n/v x 2 days with loss of appetite. She has a history significant forDM, right buttock abscess, HTN, CAD, GERD, hypothyroidism, Systolic CHF w/ EF 99991111 and moderate pulmonary HTN (2d echo 2/16), NSTEMI 2012, CABG 2013, venous stasis ulcers, and is on reglan for gastroparesis. After a Left hip replacement in March 2016 she went to a SNF at which point her skin breakdown caused an admission to the hospital for treatment. She had since been at home on doxycycline with her daughter changing her drsg daily. She presented 6/27 with tachypnea, fatigue, blood glucose of 875mg /dl and a lactic acid greater than 6.    Clinical Impression   Pt was assisted with LB dressing and IADL prior to this admission.  She sponged bathed due to wound on her buttocks. She has been reliant on a RW since her hip replacement.  Pt presents with generalized weakness and impaired balance interfering with ability to perform self care.  Pt is not interested in AD for LB ADL, prefers to rely on her family's assist.  Will follow to address toileting and standing activities at sink. Do not anticipate pt will need post acute OT.   Follow Up Recommendations  No OT follow up    Equipment Recommendations  None recommended by OT    Recommendations for Other Services       Precautions / Restrictions Precautions Precautions: Fall Restrictions Weight Bearing Restrictions: No      Mobility Bed Mobility      General bed mobility comments: pt in chair, returned to chair  Transfers Overall transfer level: Needs assistance Equipment used: Rolling walker (2 wheeled) Transfers: Sit to/from Stand Sit to Stand: Min guard Stand pivot transfers: Min guard       General transfer comment: good technique, braces LEs  on bed    Balance Overall balance assessment: Needs assistance   Sitting balance-Leahy Scale: Good     Standing balance support: Bilateral upper extremity supported;During functional activity Standing balance-Leahy Scale: Poor Standing balance comment: Needs UE support.                              ADL Overall ADL's : Needs assistance/impaired Eating/Feeding: Independent;Sitting   Grooming: Wash/dry hands;Min guard;Standing   Upper Body Bathing: Set up;Sitting   Lower Body Bathing: Sit to/from stand;Minimal assistance   Upper Body Dressing : Set up;Sitting   Lower Body Dressing: Moderate assistance;Sit to/from stand   Toilet Transfer: Min guard;Ambulation;BSC;RW   Toileting- Clothing Manipulation and Hygiene: Maximal assistance;Sit to/from stand       Functional mobility during ADLs: Min guard;Rolling walker General ADL Comments: Pt is not interested in AE for LB ADL, has been educated at [facility] and by [facility].     Vision     Perception     Praxis      Pertinent Vitals/Pain Pain Assessment: Faces Faces Pain Scale: Hurts a little bit Pain Location: buttocks Pain Descriptors / Indicators: Sore Pain Intervention(s): Repositioned     Hand Dominance Right   Extremity/Trunk Assessment Upper Extremity Assessment Upper Extremity Assessment: Overall WFL for tasks assessed   Lower Extremity Assessment Lower Extremity Assessment: Defer to PT evaluation       Communication Communication Communication: No difficulties   Cognition Arousal/Alertness: Awake/alert Behavior During  Therapy: WFL for tasks assessed/performed Overall Cognitive Status: Within Functional Limits for tasks assessed                     General Comments       Exercises       Shoulder Instructions      Home Living Family/patient expects to be discharged to:: Private residence Living Arrangements: Spouse/significant other Available Help at Discharge: Family;Available  24 hours/day (daughter also assists) Type of Home: House Home Access: Ramped entrance     Home Layout: One level     Bathroom Shower/Tub: Occupational psychologist: Standard     Home Equipment: Environmental consultant - 2 wheels;Walker - 4 wheels;Bedside commode;Wheelchair - manual;Shower seat;Hand held shower head;Other (comment);Adaptive equipment (lift chair) Adaptive Equipment: Long-handled sponge Additional Comments: sleeps in lift chair, walks holding sink in bathroom as walker does not fit      Prior Functioning/Environment Level of Independence: Needs assistance  Gait / Transfers Assistance Needed: ambulates with RW since hip surgery ADL's / Homemaking Assistance Needed: assist for LB dressing, wound care and all IADL since hip surgery   Comments: reports she had "graduated" from [facility], had RN     OT Diagnosis: Generalized weakness;Acute pain;Cognitive deficits   OT Problem List: Decreased strength;Decreased activity tolerance;Impaired balance (sitting and/or standing);Decreased cognition;Pain   OT Treatment/Interventions: Self-care/ADL training;Patient/family education    OT Goals(Current goals can be found in the care plan section) Acute Rehab OT Goals Patient Stated Goal: return home OT Goal Formulation: With patient Time For Goal Achievement: 03/16/15 Potential to Achieve Goals: Good ADL Goals Pt Will Perform Grooming: with modified independence;standing Pt Will Transfer to Toilet: with modified independence;ambulating;bedside commode (over toilet) Pt Will Perform Toileting - Clothing Manipulation and hygiene: with modified independence;sit to/from stand  OT Frequency: Min 2X/week   Barriers to D/C:            Co-evaluation              End of Session Equipment Utilized During Treatment: Gait belt;Rolling walker  Activity Tolerance: Patient tolerated treatment well Patient left: in chair;with call bell/phone within reach   Time: [id number] OT Time  Calculation (min): 19 min Charges:  OT General Charges $OT Visit: 1 Procedure OT Evaluation $Initial OT Evaluation Tier I: 1 Procedure G-Codes:    Malka So 03/09/2015, 12:07 PM  [phone]

## 2015-03-09 NOTE — Progress Notes (Signed)
Inpatient Diabetes Program Recommendations  AACE/ADA: New Consensus Statement on Inpatient Glycemic Control (2013)  Target Ranges:  Prepandial:   less than 140 mg/dL      Peak postprandial:   less than 180 mg/dL (1-2 hours)      Critically ill patients:  140 - 180 mg/dL   Results for Bridget Martinez, Bridget Martinez (MRN [id number]) as of 03/09/2015 11:31  Ref. Range 03/08/2015 08:12 03/08/2015 11:53 03/08/2015 16:53 03/08/2015 21:35 03/09/2015 07:28  Glucose-Capillary Latest Ref Range: 65-99 mg/dL 160 (H) 277 (H) 245 (H) 240 (H) 355 (H)   Diabetes history:  Type 1 diabetes Outpatient Diabetes medications: Levemir 24 units daily, Humalog 10 units with breakfast and lunch, and Humalog 6 units with supper Current orders for Inpatient glycemic control:  Levemir 35 units daily, Novolog resistant tid with meals and HS  Note that patient did not receive any basal insulin on 03/08/15.  CBG this morning was 355 mg/dL.  Note that Levemir 35 units daily given this morning.    May consider reducing Levemir to 25 units daily.  Also consider reducing Novolog correction to moderate tid with meals.  Patient may benefit from the addition of Novolog meal coverage 4 units tid with meals also.  Thanks, Adah Perl, RN, BC-ADM Inpatient Diabetes Coordinator Pager [phone] (8a-5p)

## 2015-03-09 NOTE — Progress Notes (Signed)
Pt CBG 477. Asymptomatic. Dr. Candiss Norse notified. Ordered to give 20 u Novolog insulin and recheck in 2 hours. Will continue to monitor.   Tyna Jaksch, RN

## 2015-03-09 NOTE — Progress Notes (Signed)
Follow up CBG is 306. Dr Candiss Norse notified and orders received. Will recheck in 2 hours.   Tyna Jaksch, RN

## 2015-03-10 DIAGNOSIS — E876 Hypokalemia: Secondary | ICD-10-CM

## 2015-03-10 DIAGNOSIS — E1165 Type 2 diabetes mellitus with hyperglycemia: Secondary | ICD-10-CM

## 2015-03-10 LAB — BASIC METABOLIC PANEL
ANION GAP: 9 (ref 5–15)
BUN: 12 mg/dL (ref 6–20)
CO2: 23 mmol/L (ref 22–32)
Calcium: 8.8 mg/dL — ABNORMAL LOW (ref 8.9–10.3)
Chloride: 105 mmol/L (ref 101–111)
Creatinine, Ser: 0.71 mg/dL (ref 0.44–1.00)
GLUCOSE: 52 mg/dL — AB (ref 65–99)
Potassium: 3.8 mmol/L (ref 3.5–5.1)
Sodium: 137 mmol/L (ref 135–145)

## 2015-03-10 LAB — CBC
HEMATOCRIT: 28.3 % — AB (ref 36.0–46.0)
Hemoglobin: 9.5 g/dL — ABNORMAL LOW (ref 12.0–15.0)
MCH: 29.2 pg (ref 26.0–34.0)
MCHC: 33.6 g/dL (ref 30.0–36.0)
MCV: 87.1 fL (ref 78.0–100.0)
PLATELETS: 295 10*3/uL (ref 150–400)
RBC: 3.25 MIL/uL — ABNORMAL LOW (ref 3.87–5.11)
RDW: 14.5 % (ref 11.5–15.5)
WBC: 7 10*3/uL (ref 4.0–10.5)

## 2015-03-10 LAB — GLUCOSE, CAPILLARY
GLUCOSE-CAPILLARY: 309 mg/dL — AB (ref 65–99)
GLUCOSE-CAPILLARY: 120 mg/dL — AB (ref 65–99)
Glucose-Capillary: 91 mg/dL (ref 65–99)
Glucose-Capillary: 44 mg/dL — CL (ref 65–99)
Glucose-Capillary: 83 mg/dL (ref 65–99)

## 2015-03-10 LAB — MAGNESIUM: MAGNESIUM: 1.7 mg/dL (ref 1.7–2.4)

## 2015-03-10 MED ORDER — INSULIN DETEMIR 100 UNIT/ML ~~LOC~~ SOLN
30.0000 [IU] | Freq: Every day | SUBCUTANEOUS | Status: DC
  Administered 2015-03-10 – 2015-03-11 (×2): 30 [IU] via SUBCUTANEOUS
  Filled 2015-03-10 (×2): qty 0.3

## 2015-03-10 MED ORDER — DOXYCYCLINE HYCLATE 100 MG PO TABS
100.0000 mg | ORAL_TABLET | Freq: Two times a day (BID) | ORAL | Status: DC
  Administered 2015-03-10 – 2015-03-11 (×2): 100 mg via ORAL
  Filled 2015-03-10 (×4): qty 1

## 2015-03-10 MED ORDER — INSULIN ASPART 100 UNIT/ML ~~LOC~~ SOLN
0.0000 [IU] | Freq: Three times a day (TID) | SUBCUTANEOUS | Status: DC
  Administered 2015-03-10: 11 [IU] via SUBCUTANEOUS
  Administered 2015-03-11: 8 [IU] via SUBCUTANEOUS

## 2015-03-10 MED ORDER — INSULIN ASPART 100 UNIT/ML ~~LOC~~ SOLN: 0.0000 [IU] | Freq: Every day | SUBCUTANEOUS | Status: DC

## 2015-03-10 NOTE — Progress Notes (Signed)
Progress Note  Bridget Martinez [id number] DOB: June 27, 1951 DOA: 03/06/2015 PCP: [name], FNP  Admit HPI / Brief Narrative: 64 yo female admitted from the ED 6/27 with intractable n/v x 2 days with loss of appetite. She has a history significant forDM, right buttock abscess, HTN, CAD, GERD, hypothyroidism, Systolic CHF w/ EF 99991111 and moderate pulmonary HTN (2d echo 2/16), NSTEMI 2012, CABG 2013, venous stasis ulcers, and is on reglan for gastroparesis. After a Left hip replacement in March 2016 she went to a SNF at which point her skin breakdown caused an admission to the hospital for treatment. She had since been at home on doxycycline with her daughter changing her drsg daily. She presented 6/27 with tachypnea, fatigue, blood glucose of 875mg /dl and a lactic acid greater than 6. IV access was unable to be obtained via [place] by ED staff therefore a [facility] central line was placed in order to initiate her fluid resuscitation and IV insulin regimen.   HPI/Subjective:  The patient is resting comfortably in bed.  She denies shortness of breath chest pain nausea or vomiting.  She does complain of polyuria which she states predates her admission.  There is no dysuria. Denies any focal weakness. Feels better.  Assessment/Plan:  Sepsis due to right buttock wound +/- UTI Sepsis physiology has resolved - WOC following wound - 0.5 x 0.5 x 1 cm with undermining edges and a moderate amount of yellow drainage - aqua cell packing with foam dressing to hold in place - given the relatively benign appearance of the wound I am more suspicious that the patient's sepsis may have been due to a urinary tract infection, follow urine cultures, wound culture growing diphtheroid species and she is on Flagyl and aztreonam. Clinically better. Increase activity, initiate PT and monitor.  Positive UA Urine culture not helpful - continue empiric antibiotics for a planned 7 day course - narrow spectrum as  possible  Severe hypokalemia Replaced and stable  Hypomagnesemia Replaced magnesium and stable  Left bundle branch block A chronic issue as per review of old EKGs  Lactic acidosis Resolved  Acute kidney injury Resolved with volume resuscitation  DM Gastroparesis Well-controlled with reglan  Hypothyroid Continue home replacement regimen   DKA in severely uncontrolled DM 2 DKA resolved but CBGs not yet controlled - adjusted Lantus & sliding scale and monitor  CBG (last 3)   Recent Labs  03/09/15 1619 03/09/15 2024 03/10/15 0743  GLUCAP 163* 72 91      MRSA screen positive  Code Status: FULL Family Communication: no family present at time of exam Disposition Plan: HHPT  Consultants: PCCM >TRH   Procedures: None   Anti-infectives    Start     Dose/Rate Route Frequency Ordered Stop   03/08/15 1400  vancomycin (VANCOCIN) IVPB 750 mg/150 ml premix  Status:  Discontinued     750 mg 150 mL/hr over 60 Minutes Intravenous Every 48 hours 03/06/15 1413 03/07/15 1601   03/07/15 2000  vancomycin (VANCOCIN) IVPB 1000 mg/200 mL premix     1,000 mg 200 mL/hr over 60 Minutes Intravenous Every 24 hours 03/07/15 1601     03/06/15 2200  metroNIDAZOLE (FLAGYL) IVPB 500 mg     500 mg 100 mL/hr over 60 Minutes Intravenous Every 8 hours 03/06/15 1413     03/06/15 1415  aztreonam (AZACTAM) 1 g in dextrose 5 % 50 mL IVPB     1 g 100 mL/hr over 30 Minutes Intravenous 3 times per day  03/06/15 1409     03/06/15 1400  aztreonam (AZACTAM) 2 g in dextrose 5 % 50 mL IVPB  Status:  Discontinued     2 g 100 mL/hr over 30 Minutes Intravenous  Once 03/06/15 1353 03/06/15 1409   03/06/15 1400  metroNIDAZOLE (FLAGYL) IVPB 500 mg     500 mg 100 mL/hr over 60 Minutes Intravenous  Once 03/06/15 1353 03/06/15 1919   03/06/15 1400  vancomycin (VANCOCIN) IVPB 1000 mg/200 mL premix     1,000 mg 200 mL/hr over 60 Minutes Intravenous  Once 03/06/15 1353 03/06/15 2100      DVT  prophylaxis: Subcutaneous heparin   Objective: Blood pressure 135/48, pulse 82, temperature 98.6 F (37 C), temperature source Oral, resp. rate 18, height 5\' 2"  (1.575 m), weight 63.64 kg (140 lb 4.8 oz), SpO2 100 %.  Intake/Output Summary (Last 24 hours) at 03/10/15 1030 Last data filed at 03/10/15 0900  Gross per 24 hour  Intake    780 ml  Output      0 ml  Net    780 ml   Exam: General: No acute respiratory distress Lungs: Clear to auscultation bilaterally without wheezes or crackles Cardiovascular: Regular rate and rhythm without murmur gallop or rub normal S1 and S2 Abdomen: Nontender, nondistended, soft, bowel sounds positive, no rebound, no ascites, no appreciable mass Extremities: No significant cyanosis, clubbing, or edema bilateral lower extremities  Data Reviewed: Basic Metabolic Panel:  Recent Labs Lab 03/06/15 1653  03/07/15 0400 03/07/15 1710 03/08/15 0510 03/09/15 0430 03/10/15 0523  NA 139  < > 139 135 137 132* 137  K 3.5  < > 3.1* 3.7 3.2* 4.5 3.8  CL 111  < > 115* 111 105 102 105  CO2 13*  < > 18* 18* 21* 20* 23  GLUCOSE 241*  < > 93 213* 150* 329* 52*  BUN 30*  < > 19 13 8 10 12   CREATININE 1.90*  < > 1.09* 1.01* 0.75 0.81 0.71  CALCIUM 8.3*  < > 7.8* 8.2* 8.7* 8.4* 8.8*  MG 1.3*  --   --   --  1.5*  --  1.7  PHOS 3.4  --   --   --  2.3*  --   --   < > = values in this interval not displayed. CBC:  Recent Labs Lab 03/06/15 1221 03/06/15 1653 03/07/15 0400 03/08/15 0510 03/09/15 0430 03/10/15 0523  WBC 20.1* 12.6* 8.1 6.9 5.8 7.0  NEUTROABS 17.1* 10.4*  --   --   --   --   HGB 11.5* 8.9* 8.0* 9.5* 9.6* 9.5*  HCT 36.8 26.8* 23.2* 28.1* 28.3* 28.3*  MCV 95.3 89.6 86.2 87.0 88.7 87.1  PLT 400 PLATELETS APPEAR ADEQUATE 281 281 260 295   Liver Function Tests:  Recent Labs Lab 03/06/15 1221 03/06/15 1653  AST 19 17  ALT 16 12*  ALKPHOS 145* 100  BILITOT 1.3* 0.8  PROT 7.9 5.7*  ALBUMIN 4.2 3.0*   Coags:  Recent Labs Lab  03/06/15 1653  INR 1.18    Recent Labs Lab 03/06/15 1653  APTT 20*   CBG:  Recent Labs Lab 03/09/15 1138 03/09/15 1424 03/09/15 1619 03/09/15 2024 03/10/15 0743  GLUCAP 477* 306* 163* 72 91    Recent Results (from the past 240 hour(s))  Culture, routine-abscess     Status: None   Collection Time: 03/06/15 12:38 PM  Result Value Ref Range Status   Specimen Description ABSCESS BUTTOCKS  Final  Special Requests Normal  Final   Gram Stain   Final    NO WBC SEEN NO SQUAMOUS EPITHELIAL CELLS SEEN RARE GRAM POSITIVE RODS Performed at [facility]    Culture   Final    MODERATE DIPHTHEROIDS(CORYNEBACTERIUM SPECIES) Note: Standardized susceptibility testing for this organism is not available. Performed at [facility]    Report Status 03/09/2015 FINAL  Final  Blood culture (routine x 2)     Status: None (Preliminary result)   Collection Time: 03/06/15  2:25 PM  Result Value Ref Range Status   Specimen Description BLOOD RIGHT ANTECUBITAL  Final   Special Requests BOTTLES DRAWN AEROBIC ONLY 3CC  Final   Culture NO GROWTH 3 DAYS  Final   Report Status PENDING  Incomplete  Blood culture (routine x 2)     Status: None (Preliminary result)   Collection Time: 03/06/15  2:59 PM  Result Value Ref Range Status   Specimen Description BLOOD LEFT NECK  Final   Special Requests BOTTLES DRAWN AEROBIC AND ANAEROBIC 4ML  Final   Culture NO GROWTH 3 DAYS  Final   Report Status PENDING  Incomplete  MRSA PCR Screening     Status: Abnormal   Collection Time: 03/06/15  5:26 PM  Result Value Ref Range Status   MRSA by PCR POSITIVE (A) NEGATIVE Final    Comment:        The GeneXpert MRSA Assay (FDA approved for NASAL specimens only), is one component of a comprehensive MRSA colonization surveillance program. It is not intended to diagnose MRSA infection nor to guide or monitor treatment for MRSA infections. RESULT CALLED TO, READ BACK BY AND VERIFIED WITH: D  TURNER RN 2034 03/06/15 A BROWNING   Culture, blood (x 2)     Status: None (Preliminary result)   Collection Time: 03/06/15  6:07 PM  Result Value Ref Range Status   Specimen Description BLOOD BLOOD LEFT FOREARM  Final   Special Requests BOTTLES DRAWN AEROBIC AND ANAEROBIC 10 EACH  Final   Culture NO GROWTH 3 DAYS  Final   Report Status PENDING  Incomplete  Culture, blood (x 2)     Status: None (Preliminary result)   Collection Time: 03/06/15  6:23 PM  Result Value Ref Range Status   Specimen Description BLOOD LEFT FOREARM  Final   Special Requests AEROBIC BOTTLE ONLY 1CC  Final   Culture NO GROWTH 3 DAYS  Final   Report Status PENDING  Incomplete  Urine culture     Status: None   Collection Time: 03/06/15  9:58 PM  Result Value Ref Range Status   Specimen Description IN/OUT CATH URINE  Final   Special Requests NONE  Final   Culture   Final    MULTIPLE SPECIES PRESENT, SUGGEST RECOLLECTION IF CLINICALLY INDICATED   Report Status 03/08/2015 FINAL  Final     Studies:   Recent x-ray studies have been reviewed    Scheduled Meds:  Scheduled Meds: . antiseptic oral rinse  7 mL Mouth Rinse q12n4p  . aspirin EC  81 mg Oral Daily  . aztreonam  1 g Intravenous 3 times per day  . buPROPion  150 mg Oral BID  . carvedilol  3.125 mg Oral BID WC  . chlorhexidine  15 mL Mouth Rinse BID  . Chlorhexidine Gluconate Cloth  6 each Topical Q0600  . escitalopram  20 mg Oral Daily  . heparin  5,000 Units Subcutaneous 3 times per day  . insulin aspart  0-15 Units Subcutaneous TID WC  . insulin aspart  0-5 Units Subcutaneous QHS  . insulin detemir  30 Units Subcutaneous Daily  . levothyroxine  125 mcg Oral Daily  . metoCLOPramide  5 mg Oral TID AC  . metronidazole  500 mg Intravenous Q8H  . mupirocin ointment  1 application Nasal BID  . vancomycin  1,000 mg Intravenous Q24H    Time spent on care of this patient: 35 mins  SINGH,PRASHANT K M.D on 03/10/2015 at 10:30 AM  Between 7am to 7pm -  Pager - [phone], After 7pm go to [email or web address] - password [facility]  [facility]  Office  [phone]    LOS: 4 days

## 2015-03-10 NOTE — Care Management Note (Signed)
Case Management Note  Patient Details  Name: Bridget Martinez MRN: [id number] Date of Birth: 19-Apr-1951  Subjective/Objective:             CM following for progression and d/c planning       Action/Plan: Await d/c orders  Expected Discharge Date:       03/12/2015           Expected Discharge Plan:  Home/Self Care  In-House Referral:  NA  Discharge planning Services  CM Consult  Post Acute Care Choice:    Choice offered to:     DME Arranged:    DME Agency:     HH Arranged:    HH Agency:     Status of Service:  In process, will continue to follow  Medicare Important Message Given:    Date Medicare IM Given:    Medicare IM give by:    Date Additional Medicare IM Given:    Additional Medicare Important Message give by:     If discussed at [place] of Stay Meetings, dates discussed:    Additional Comments:  Adron Bene, RN 03/10/2015, 12:47 PM

## 2015-03-11 LAB — CULTURE, BLOOD (ROUTINE X 2)
CULTURE: NO GROWTH
CULTURE: NO GROWTH
Culture: NO GROWTH
Culture: NO GROWTH

## 2015-03-11 LAB — GLUCOSE, CAPILLARY
GLUCOSE-CAPILLARY: 114 mg/dL — AB (ref 65–99)
Glucose-Capillary: 97 mg/dL (ref 65–99)
Glucose-Capillary: 292 mg/dL — ABNORMAL HIGH (ref 65–99)

## 2015-03-11 MED ORDER — DOXYCYCLINE HYCLATE 100 MG PO TABS: 100.0000 mg | ORAL_TABLET | Freq: Two times a day (BID) | ORAL | Status: DC

## 2015-03-11 MED ORDER — NOVOLOG FLEXPEN 100 UNIT/ML ~~LOC~~ SOPN: PEN_INJECTOR | SUBCUTANEOUS | Status: DC

## 2015-03-11 MED ORDER — LEVEMIR FLEXTOUCH 100 UNIT/ML ~~LOC~~ SOPN: 30.0000 [IU] | PEN_INJECTOR | SUBCUTANEOUS | Status: DC

## 2015-03-11 NOTE — Progress Notes (Signed)
[facility] CVC  Removed per order with blue tip in tact.  Site cleaned with CHG wipe, vaseline guaze and dry 4x4 applied to site, pressure held x 5 min with no bleeding noted.  Pt and husband verbalizes understanding of site care, dressing care, signs of infection and interventions in case of bleeding and when to call doctor via Will.  Site without signs of infection or bleeding present.  No ADN.

## 2015-03-11 NOTE — Discharge Instructions (Signed)
Follow with Primary MD [name], FNP and your orthopedic surgeon in 7 days   Get CBC, CMP, 2 view Chest X ray checked  by Primary MD next visit.    Activity: As tolerated with Full fall precautions use walker/cane & assistance as needed   Disposition HHPT   Diet: Heart Healthy Low Carb, with feeding assistance and aspiration precautions.  Accuchecks 4 times/day, Once in AM empty stomach and then before each meal. Log in all results and show them to your Prim.MD in 3 days. If any glucose reading is under 80 or above 300 call your Prim MD immidiately. Follow Low glucose instructions for glucose under 80 as instructed.    For Heart failure patients - Check your Weight same time everyday, if you gain over 2 pounds, or you develop in leg swelling, experience more shortness of breath or chest pain, call your Primary MD immediately. Follow Cardiac Low Salt Diet and 1.5 lit/day fluid restriction.   On your next visit with your primary care physician please Get Medicines reviewed and adjusted.   Please request your Prim.MD to go over all Hospital Tests and Procedure/Radiological results at the follow up, please get all Hospital records sent to your Prim MD by signing hospital release before you go home.   If you experience worsening of your admission symptoms, develop shortness of breath, life threatening emergency, suicidal or homicidal thoughts you must seek medical attention immediately by calling 911 or calling your MD immediately  if symptoms less severe.  You Must read complete instructions/literature along with all the possible adverse reactions/side effects for all the Medicines you take and that have been prescribed to you. Take any new Medicines after you have completely understood and accpet all the possible adverse reactions/side effects.   Do not drive, operating heavy machinery, perform activities at heights, swimming or participation in water activities or provide  baby sitting services if your were admitted for syncope or siezures until you have seen by Primary MD or a Neurologist and advised to do so again.  Do not drive when taking Pain medications.    Do not take more than prescribed Pain, Sleep and Anxiety Medications  Special Instructions: If you have smoked or chewed Tobacco  in the last 2 yrs please stop smoking, stop any regular Alcohol  and or any Recreational drug use.  Wear Seat belts while driving.   Please note  You were cared for by a hospitalist during your hospital stay. If you have any questions about your discharge medications or the care you received while you were in the hospital after you are discharged, you can call the unit and asked to speak with the hospitalist on call if the hospitalist that took care of you is not available. Once you are discharged, your primary care physician will handle any further medical issues. Please note that NO REFILLS for any discharge medications will be authorized once you are discharged, as it is imperative that you return to your primary care physician (or establish a relationship with a primary care physician if you do not have one) for your aftercare needs so that they can reassess your need for medications and monitor your lab values.

## 2015-03-11 NOTE — Discharge Summary (Signed)
Bridget Martinez, is a 64 y.o. female  DOB [birth year]  MRN [id number].  Admission date:  03/06/2015  Admitting Physician  [name], MD  Discharge Date:  03/11/2015   Primary MD  [name], FNP  Recommendations for primary care physician for things to follow:   Monitor glycemic control closely, check CBC CMP in a week.   Admission Diagnosis  SOB (shortness of breath) [R06.02] Arterial line in place [Z98.89] Sepsis [A41.9]   Discharge Diagnosis  SOB (shortness of breath) [R06.02] Arterial line in place [Z98.89] Sepsis [A41.9]     Active Problems:   DKA (diabetic ketoacidoses)   Arterial line in place   Sepsis      Past Medical History  Diagnosis Date  . Diabetes mellitus     on insulin, with h/o DKA   . HTN (hypertension)   . Hyperlipidemia   . Venous stasis ulcers   . Arthritis   . Depression     anxiety  . 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 Hypertension   . Coronary artery disease     angina.  MI.   . Pneumonia        . GERD (gastroesophageal reflux disease)     Hiatal hernia.   . Hypothyroidism   . CHF (congestive heart failure)   . Tuberculosis   . Osteoporosis     Past Surgical History  Procedure Laterality Date  . Cardiac catheterization    . Tonsillectomy    . Coronary artery bypass graft      Dr. Prescott Gum in 09/2011   . Coronary artery bypass graft  2012  . Esophagogastroduodenoscopy N/A 11/17/2013    Procedure: ESOPHAGOGASTRODUODENOSCOPY (EGD);  Surgeon: Lafayette Dragon, MD;  Location[facility] ENDOSCOPY;  Service: Endoscopy;  Laterality: N/A;  . Colonoscopy N/A 11/18/2013    Procedure: COLONOSCOPY;  Surgeon: Lafayette Dragon, MD;  Location: [facility] ENDOSCOPY;  Service: Endoscopy;  Laterality: N/A;  . Right heart  catheterization N/A 07/29/2011    Procedure: RIGHT HEART CATH;  Surgeon: Minus Breeding, MD;  Location: [facility] CATH LAB;  Service: Cardiovascular;  Laterality: N/A;  . Left heart catheterization with coronary angiogram N/A 07/26/2011    Procedure: LEFT HEART CATHETERIZATION WITH CORONARY ANGIOGRAM;  Surgeon: Larey Dresser, MD;  Location: [facility] CATH LAB;  Service: Cardiovascular;  Laterality: N/A;  . Total hip arthroplasty Left 11/21/2014    Procedure: TOTAL HIP ARTHROPLASTY ANTERIOR APPROACH;  Surgeon: Mcarthur Rossetti, MD;  Location: [place];  Service: Orthopedics;  Laterality: Left;       HPI  from the history and physical done on the day of admission:     64 yo female admitted from the ED 6/27 with intractable n/v x 2 days with loss of appetite. She has a history significant forDM, right buttock abscess, HTN, CAD, GERD, hypothyroidism, Systolic CHF w/ EF [id number] and moderate pulmonary HTN (2d echo 2/16), NSTEMI 2012, CABG 2013, venous stasis ulcers, and is on reglan  for gastroparesis. After a Left hip replacement in March 2016 she went to a SNF at which point her skin breakdown caused an admission to the hospital for treatment. She had since been at home on doxycycline with her daughter changing her drsg daily. She presented 6/27 with tachypnea, fatigue, blood glucose of 875mg /dl and a lactic acid greater than 6. IV access was unable to be obtained via [place] by ED staff therefore a [facility] central line was placed in order to initiate her fluid resuscitation and IV insulin regimen.     Hospital Course:     ? Sepsis due to UTI - could also be complex DKA picture with UTI without sepsis, was admitted by critical care, received appropriate IV anti-biotics, blood and urine cultures unremarkable, wound culture which was superficial grew Corynebacterium speech she which likely is skin flora. Received local wound care. Right buttock stage II to 3 decubitus ulcer was seen by wound care team kindly see their note  for all details, wound does not look grossly infected no surrounding cellulitis. She will be transitioned to few more days of oral doxycycline and discharged home. Currently nontoxic appearing with no fever or leukocytosis. No signs of active infection at this time.   Positive UA with questionable sepsis Urine culture not helpful - received appropriate IV in diabetics, now transitioned to [facility] for 5 more days.  Severe hypokalemia Replaced and stable  Hypomagnesemia Replaced magnesium and stable  Left bundle branch block A chronic issue as per review of old EKGs  Lactic acidosis Resolved with hydration and supportive care for possible sepsis and DKA  Acute kidney injury Resolved with volume resuscitation  DM Gastroparesis Well-controlled with reglan  Hypothyroid Continue home replacement regimen   DKA in severely uncontrolled DM 2 DKA resolved , poor outpatient control with A1c 8.9, have adjusted her Levemir dose and placed on sliding scale insulin upon discharge. Request PCP to monitor CBG and glycemic control closely.   Recent left hip fracture.  Has received appropriate PT, this was few months ago, will get home PT at home and follow with her primary orthopedic surgeon postdischarge.       Discharge Condition: Stable  Follow UP  Follow-up Information    Follow up with [name], FNP. Schedule an appointment as soon as possible for a visit in 1 week.   Specialty:  Nurse Practitioner   Why:  And your orthopedic surgeon   Contact information:   [place]  [id number] [phone]        Consults obtained -  PCCM  Diet and Activity recommendation: See Discharge Instructions below  Discharge Instructions       Discharge Instructions    Discharge instructions    Complete by:  As directed   Follow with Primary MD [name], [place] and your orthopedic surgeon in 7 days   Get CBC, CMP, 2 view Chest X ray checked  by  Primary MD next visit.    Activity: As tolerated with Full fall precautions use walker/cane & assistance as needed   Disposition HHPT   Diet: Heart Healthy Low Carb, with feeding assistance and aspiration precautions.  Accuchecks 4 times/day, Once in AM empty stomach and then before each meal. Log in all results and show them to your Prim.MD in 3 days. If any glucose reading is under 80 or above 300 call your Prim MD immidiately. Follow Low glucose instructions for glucose under 80 as instructed.    For Heart failure patients -  Check your Weight same time everyday, if you gain over 2 pounds, or you develop in leg swelling, experience more shortness of breath or chest pain, call your Primary MD immediately. Follow Cardiac Low Salt Diet and 1.5 lit/day fluid restriction.   On your next visit with your primary care physician please Get Medicines reviewed and adjusted.   Please request your Prim.MD to go over all Hospital Tests and Procedure/Radiological results at the follow up, please get all Hospital records sent to your Prim MD by signing hospital release before you go home.   If you experience worsening of your admission symptoms, develop shortness of breath, life threatening emergency, suicidal or homicidal thoughts you must seek medical attention immediately by calling 911 or calling your MD immediately  if symptoms less severe.  You Must read complete instructions/literature along with all the possible adverse reactions/side effects for all the Medicines you take and that have been prescribed to you. Take any new Medicines after you have completely understood and accpet all the possible adverse reactions/side effects.   Do not drive, operating heavy machinery, perform activities at heights, swimming or participation in water activities or provide baby sitting services if your were admitted for syncope or siezures until you have seen by Primary MD or a Neurologist and advised to do  so again.  Do not drive when taking Pain medications.    Do not take more than prescribed Pain, Sleep and Anxiety Medications  Special Instructions: If you have smoked or chewed Tobacco  in the last 2 yrs please stop smoking, stop any regular Alcohol  and or any Recreational drug use.  Wear Seat belts while driving.   Please note  You were cared for by a hospitalist during your hospital stay. If you have any questions about your discharge medications or the care you received while you were in the hospital after you are discharged, you can call the unit and asked to speak with the hospitalist on call if the hospitalist that took care of you is not available. Once you are discharged, your primary care physician will handle any further medical issues. Please note that NO REFILLS for any discharge medications will be authorized once you are discharged, as it is imperative that you return to your primary care physician (or establish a relationship with a primary care physician if you do not have one) for your aftercare needs so that they can reassess your need for medications and monitor your lab values.     Increase activity slowly    Complete by:  As directed              Discharge Medications       Medication List    STOP taking these medications        insulin lispro 100 UNIT/ML injection  Commonly known as:  HUMALOG      TAKE these medications        aspirin EC 81 MG tablet  Take 1 tablet (81 mg total) by mouth daily.     buPROPion 150 MG 24 hr tablet  Commonly known as:  WELLBUTRIN XL  Take 1 tablet (150 mg total) by mouth 2 (two) times daily.     carvedilol 3.125 MG tablet  Commonly known as:  COREG  Take 1 tablet (3.125 mg total) by mouth 2 (two) times daily with a meal.     doxycycline 100 MG tablet  Commonly known as:  VIBRA-TABS  Take 1 tablet (100 mg total) by  mouth 2 (two) times daily with a meal.     escitalopram 20 MG tablet  Commonly known as:  LEXAPRO    Take 20 mg by mouth daily.     ferrous sulfate 325 (65 FE) MG tablet  Take 1 tablet (325 mg total) by mouth 2 (two) times daily with a meal.     HYDROcodone-acetaminophen 5-325 MG per tablet  Commonly known as:  NORCO  Take 1 tablet by mouth every 4 (four) hours as needed for moderate pain.     LEVEMIR FLEXTOUCH 100 UNIT/ML Pen  Generic drug:  Insulin Detemir  Inject 30 Units into the skin every morning.     levothyroxine 125 MCG tablet  Commonly known as:  SYNTHROID, LEVOTHROID  Take 1 tablet (125 mcg total) by mouth daily.     magnesium oxide 400 (241.3 MG) MG tablet  Commonly known as:  MAG-OX  Take 1 tablet (400 mg total) by mouth daily.     megestrol 400 MG/10ML suspension  Commonly known as:  MEGACE  Take 10 mLs (400 mg total) by mouth 2 (two) times daily.     metoCLOPramide 5 MG tablet  Commonly known as:  REGLAN  Take 1 tablet (5 mg total) by mouth 3 (three) times daily before meals.     NOVOLOG FLEXPEN 100 UNIT/ML FlexPen  Generic drug:  insulin aspart  Before each meal 3 times a day, 140-199 - 2 units, 200-250 - 4 units, 251-299 - 6 units,  300-349 - 8 units,  350 or above 10 units. Insulin PEN if approved, provide syringes and needles if needed. DX E 11.65     ondansetron 8 MG disintegrating tablet  Commonly known as:  ZOFRAN ODT  Take 1 tablet (8 mg total) by mouth every 8 (eight) hours as needed for nausea or vomiting.     pantoprazole 40 MG tablet  Commonly known as:  PROTONIX  Take 1 tablet (40 mg total) by mouth daily.     rosuvastatin 40 MG tablet  Commonly known as:  CRESTOR  Take 1 tablet (40 mg total) by mouth daily.     spironolactone 25 MG tablet  Commonly known as:  ALDACTONE  Take 1 tablet (25 mg total) by mouth daily.        Major procedures and Radiology Reports - PLEASE review detailed and final reports for all details, in brief -       Dg Chest Port 1 View  03/06/2015   CLINICAL DATA:  Central line placement.  EXAM: PORTABLE  CHEST - 1 VIEW  COMPARISON:  November 24, 2014.  FINDINGS: The heart size and mediastinal contours are within normal limits. Status post Coronary artery bypass graft. No pneumothorax or pleural effusion is noted. Interval placement of left internal jugular catheter line with distal tip at the cavoatrial junction. Both lungs are clear. The visualized skeletal structures are unremarkable.  IMPRESSION: Interval placement of left internal jugular catheter line with distal tip at the expected position of the cavoatrial junction. No pneumothorax is noted.   Electronically Signed   By: Marijo Conception, M.D.   On: 03/06/2015 14:25    Micro Results      Recent Results (from the past 240 hour(s))  Culture, routine-abscess     Status: None   Collection Time: 03/06/15 12:38 PM  Result Value Ref Range Status   Specimen Description ABSCESS BUTTOCKS  Final   Special Requests Normal  Final   Gram Stain   Final  NO WBC SEEN NO SQUAMOUS EPITHELIAL CELLS SEEN RARE GRAM POSITIVE RODS Performed at [facility]    Culture   Final    MODERATE DIPHTHEROIDS(CORYNEBACTERIUM SPECIES) Note: Standardized susceptibility testing for this organism is not available. Performed at [facility]    Report Status 03/09/2015 FINAL  Final  Blood culture (routine x 2)     Status: None (Preliminary result)   Collection Time: 03/06/15  2:25 PM  Result Value Ref Range Status   Specimen Description BLOOD RIGHT ANTECUBITAL  Final   Special Requests BOTTLES DRAWN AEROBIC ONLY 3CC  Final   Culture NO GROWTH 4 DAYS  Final   Report Status PENDING  Incomplete  Blood culture (routine x 2)     Status: None (Preliminary result)   Collection Time: 03/06/15  2:59 PM  Result Value Ref Range Status   Specimen Description BLOOD LEFT NECK  Final   Special Requests BOTTLES DRAWN AEROBIC AND ANAEROBIC 4ML  Final   Culture NO GROWTH 4 DAYS  Final   Report Status PENDING  Incomplete  MRSA PCR Screening     Status: Abnormal    Collection Time: 03/06/15  5:26 PM  Result Value Ref Range Status   MRSA by PCR POSITIVE (A) NEGATIVE Final    Comment:        The GeneXpert MRSA Assay (FDA approved for NASAL specimens only), is one component of a comprehensive MRSA colonization surveillance program. It is not intended to diagnose MRSA infection nor to guide or monitor treatment for MRSA infections. RESULT CALLED TO, READ BACK BY AND VERIFIED WITH: D TURNER RN 2034 03/06/15 A BROWNING   Culture, blood (x 2)     Status: None (Preliminary result)   Collection Time: 03/06/15  6:07 PM  Result Value Ref Range Status   Specimen Description BLOOD BLOOD LEFT FOREARM  Final   Special Requests BOTTLES DRAWN AEROBIC AND ANAEROBIC 10 EACH  Final   Culture NO GROWTH 4 DAYS  Final   Report Status PENDING  Incomplete  Culture, blood (x 2)     Status: None (Preliminary result)   Collection Time: 03/06/15  6:23 PM  Result Value Ref Range Status   Specimen Description BLOOD BLOOD LEFT FOREARM  Final   Special Requests BOTTLES DRAWN AEROBIC ONLY 1CC  Final   Culture NO GROWTH 4 DAYS  Final   Report Status PENDING  Incomplete  Urine culture     Status: None   Collection Time: 03/06/15  9:58 PM  Result Value Ref Range Status   Specimen Description IN/OUT CATH URINE  Final   Special Requests NONE  Final   Culture   Final    MULTIPLE SPECIES PRESENT, SUGGEST RECOLLECTION IF CLINICALLY INDICATED   Report Status 03/08/2015 FINAL  Final       Today   Subjective    Bridget Martinez today has no headache,no chest abdominal pain,no new weakness tingling or numbness, feels much better wants to go home today.    Objective   Blood pressure 104/72, pulse 93, temperature 98.6 F (37 C), temperature source Oral, resp. rate 18, height 5\' 2"  (1.575 m), weight 60.782 kg (134 lb), SpO2 99 %.   Intake/Output Summary (Last 24 hours) at 03/11/15 0933 Last data filed at 03/11/15 0931  Gross per 24 hour  Intake   1100 ml  Output    175  ml  Net    925 ml    Exam Awake Alert, Oriented x 3, No new F.N  deficits, Normal affect [place].AT,PERRAL Supple Neck,No JVD, No cervical lymphadenopathy appriciated.  Symmetrical Chest wall movement, Good air movement bilaterally, CTAB RRR,No Gallops,Rubs or new Murmurs, No Parasternal Heave +ve B.Sounds, Abd Soft, Non tender, No organomegaly appriciated, No rebound -guarding or rigidity. No Cyanosis, Clubbing or edema, No new Rash or bruise   Data Review   CBC w Diff: Lab Results  Component Value Date   WBC 7.0 03/10/2015   WBC 9.3 11/10/2013   HGB 9.5* 03/10/2015   HGB 10.2* 11/10/2013   HCT 28.3* 03/10/2015   HCT 32.1* 11/10/2013   PLT 295 03/10/2015   LYMPHOPCT 9* 03/06/2015   MONOPCT 9 03/06/2015   EOSPCT 0 03/06/2015   BASOPCT 0 03/06/2015    CMP: Lab Results  Component Value Date   NA 137 03/10/2015   NA 136 11/10/2013   K 3.8 03/10/2015   CL 105 03/10/2015   CO2 23 03/10/2015   BUN 12 03/10/2015   BUN 25 11/10/2013   CREATININE 0.71 03/10/2015   PROT 5.7* 03/06/2015   PROT 6.1 11/10/2013   ALBUMIN 3.0* 03/06/2015   BILITOT 0.8 03/06/2015   ALKPHOS 100 03/06/2015   AST 17 03/06/2015   ALT 12* 03/06/2015  . Lab Results  Component Value Date   HGBA1C 8.9* 02/03/2015    CBG (last 3)   Recent Labs  03/10/15 2149 03/11/15 0142 03/11/15 0736  GLUCAP 83 114* 97     Total Time in preparing paper work, data evaluation and todays exam - 20 minutes  Thurnell Lose M.D on 03/11/2015 at 9:33 AM  [facility]   Office  [phone]

## 2015-03-11 NOTE — Progress Notes (Signed)
Patient ambulated 50 feet in hallway with front wheeled walker. Gait belt was on during ambulation for safety measures. Patient tolerated well. She stated that at the end of ambulating she did get a 'little tired'.

## 2015-03-11 NOTE — Progress Notes (Signed)
Referred to CSW for ?SNF. Chart reviewed and have noted plans for dc to home. PT recommends home with HH. CSW to sign off- please contact us if SW needs arise. Eduard Clos, MSW, [place]

## 2015-03-11 NOTE — Progress Notes (Signed)
Discharge instructions and medications discussed with patient.  Prescriptions given to patient.  All questions answered.  

## 2015-03-11 NOTE — Progress Notes (Signed)
Spoke with patient and "Judson Roch," CM. Patient has had service with [facility] in the past and requests their services again for HHPT. CM to arrange.No questions per patient.

## 2015-03-11 NOTE — Progress Notes (Signed)
Hypoglycemic Event  CBG: 44  Treatment: 15 GM carbohydrate snack  Symptoms: None  Follow-up CBG: Time:2149 CBG Result:83  Possible Reasons for Event: Unknown  Comments/MD notified:Will continue to monitor patient.    Bridget Martinez, Thea Gist  Remember to initiate Hypoglycemia Order Set & complete

## 2015-03-12 NOTE — Care Management Note (Signed)
Case Management Note  Patient Details  Name: SANAYAH LESKO MRN: [id number] Date of Birth: 06/26/1951  Subjective/Objective:                   SOB (shortness of breath) [R06.02] Arterial line in place [Z98.89] Sepsis [A41.9] Action/Plan: Discharge planning  Expected Discharge Date:                  Expected Discharge Plan:  [place]  In-House Referral:  NA  Discharge planning Services  CM Consult  Post Acute Care Choice:    Choice offered to:  Patient  DME Arranged:    DME Agency:     HH Arranged:  RN, PT, Nurse's Aide [place] Agency:  [facility]  Status of Service:  Completed, signed off  Medicare Important Message Given:    Date Medicare IM Given:    Medicare IM give by:    Date Additional Medicare IM Given:    Additional Medicare Important Message give by:     If discussed at [place] of Stay Meetings, dates discussed:    Additional Comments: LATE ENTRY: CM received call pt could not wait for CM but requests [facility] to render HHPT/RN/Aide/SW.  Referral called to [facility] rep, Tiffany.  No other Cm needs were communicated. Dellie Catholic, RN 03/12/2015, 8:16 AM

## 2015-03-16 ENCOUNTER — Telehealth: Payer: Self-pay

## 2015-03-16 NOTE — Telephone Encounter (Signed)
Insurance prior authorized Pantoprazole 40 mg [id number]  02/14/15 to 03/15/16

## 2015-03-24 ENCOUNTER — Ambulatory Visit (INDEPENDENT_AMBULATORY_CARE_PROVIDER_SITE_OTHER): Payer: BC Managed Care – PPO | Admitting: Family Medicine

## 2015-03-24 ENCOUNTER — Encounter: Payer: Self-pay | Admitting: Family Medicine

## 2015-03-24 VITALS — BP 79/49 | HR 82 | Temp 98.4°F | Ht 62.0 in | Wt 127.0 lb

## 2015-03-24 DIAGNOSIS — I5042 Chronic combined systolic (congestive) and diastolic (congestive) heart failure: Secondary | ICD-10-CM | POA: Diagnosis not present

## 2015-03-24 DIAGNOSIS — M21331 Wrist drop, right wrist: Secondary | ICD-10-CM | POA: Diagnosis not present

## 2015-03-24 DIAGNOSIS — I10 Essential (primary) hypertension: Secondary | ICD-10-CM

## 2015-03-24 DIAGNOSIS — E1041 Type 1 diabetes mellitus with diabetic mononeuropathy: Secondary | ICD-10-CM

## 2015-03-24 DIAGNOSIS — E1065 Type 1 diabetes mellitus with hyperglycemia: Secondary | ICD-10-CM

## 2015-03-24 DIAGNOSIS — IMO0002 Reserved for concepts with insufficient information to code with codable children: Secondary | ICD-10-CM

## 2015-03-24 DIAGNOSIS — E1049 Type 1 diabetes mellitus with other diabetic neurological complication: Secondary | ICD-10-CM

## 2015-03-24 NOTE — Addendum Note (Signed)
Addended by: Ilean China on: 03/24/2015 02:00 PM   Modules accepted: Orders

## 2015-03-24 NOTE — Progress Notes (Signed)
Subjective:    Patient ID: Bridget Martinez, female    DOB: Nov 28, 1950, 64 y.o.   MRN: [id number]  HPI 64 year old lady with insulin-dependent diabetes, congestive heart failure, gastroparesis, protein calorie malnutrition. Since her last visit she's been hospitalized for another episode of DKA. Looking back over her record there've been multiple hospitalizations for this. Currently she is on 40 mg of Levemir that is 20 twice a day and a sliding scale of Humalog based on her pre-meal sugar. This morning her fasting sugar was 400 yesterday was 200. I'm not sure she really understands how much diet affects her sugars. Last night for supper she ate has puppies. New line the granddaughter says her buttock wound is healing and this is followed by home health nurse as well as the granddaughter. She denies any chest pain or shortness of breath  Patient Active Problem List   Diagnosis Date Noted  . Arterial line in place   . Sepsis   . Abscess, gluteal, right 02/06/2015  . Allergic dermatitis 02/03/2015  . Pressure ulcer 02/03/2015  . Abscess of buttock, right 02/03/2015  . Abscess of right buttock   . Diabetes 11/19/2014  . Left displaced femoral neck fracture 11/19/2014  . Fall   . Closed left hip fracture   . Diabetic ketoacidosis without coma associated with diabetes mellitus due to underlying condition   . Anemia 11/02/2014  . NSTEMI (non-ST elevated myocardial infarction)   . Diabetic gastroparesis associated with type 1 diabetes mellitus   . Protein-calorie malnutrition, severe 10/27/2014  . DKA, type 1 10/26/2014  . Acute renal failure 10/26/2014  . Encephalopathy acute 10/26/2014  . Hyperkalemia 10/26/2014  . Chronic combined systolic and diastolic congestive heart failure 09/19/2014  . Hypothyroidism 09/19/2014  . Abscess of left thigh 09/15/2014  . Blood poisoning   . Diabetic ketoacidosis with coma associated with other specified diabetes mellitus   . SIRS (systemic  inflammatory response syndrome) 07/30/2014  . Respiratory failure 07/26/2014  . Acidosis   . Altered mental status   . Diabetic ketoacidosis with coma associated with type 2 diabetes mellitus   . Tachycardia   . Chronic systolic heart failure 99991111  . Hypoglycemia 11/27/2013  . Hypotension 11/27/2013  . Hypokalemia 11/27/2013  . Nausea with vomiting 11/19/2013  . Abdominal pain, right lower quadrant 11/18/2013  . Acute on chronic renal failure 11/15/2013  . Acute on chronic systolic heart failure 99991111  . DM (diabetes mellitus), type 1, uncontrolled w/neurologic complication 99991111  . Diabetic gastroparesis 11/13/2013  . UTI (urinary tract infection) 11/11/2013  . CKD (chronic kidney disease), stage III 11/11/2013  . Yeast infection of the vagina 10/27/2013  . Myocardial infarction   . Hypertension   . Heart murmur   . DKA (diabetic ketoacidoses) 09/03/2013  . Chronic systolic congestive heart failure, NYHA class 3 08/25/2011  . CAD, multiple vessel 07/27/2011  . Depression 07/25/2011  . Hyperlipidemia   . Venous stasis ulcers    Outpatient Encounter Prescriptions as of 03/24/2015  Medication Sig  . aspirin EC 81 MG tablet Take 1 tablet (81 mg total) by mouth daily.  Marland Kitchen buPROPion (WELLBUTRIN XL) 150 MG 24 hr tablet Take 1 tablet (150 mg total) by mouth 2 (two) times daily.  . carvedilol (COREG) 3.125 MG tablet Take 1 tablet (3.125 mg total) by mouth 2 (two) times daily with a meal.  . escitalopram (LEXAPRO) 10 MG tablet   . LEVEMIR FLEXTOUCH 100 UNIT/ML Pen Inject 30 Units into the skin every  morning. (Patient taking differently: Inject 20 Units into the skin 2 (two) times daily. )  . levothyroxine (SYNTHROID, LEVOTHROID) 125 MCG tablet Take 1 tablet (125 mcg total) by mouth daily.  . megestrol (MEGACE) 400 MG/10ML suspension Take 10 mLs (400 mg total) by mouth 2 (two) times daily.  . metoCLOPramide (REGLAN) 5 MG tablet Take 1 tablet (5 mg total) by mouth 3 (three)  times daily before meals.  Marland Kitchen NOVOLOG FLEXPEN 100 UNIT/ML FlexPen Before each meal 3 times a day, 140-199 - 2 units, 200-250 - 4 units, 251-299 - 6 units,  300-349 - 8 units,  350 or above 10 units. Insulin PEN if approved, provide syringes and needles if needed. DX E 11.65  . ondansetron (ZOFRAN ODT) 8 MG disintegrating tablet Take 1 tablet (8 mg total) by mouth every 8 (eight) hours as needed for nausea or vomiting.  . pantoprazole (PROTONIX) 40 MG tablet Take 1 tablet (40 mg total) by mouth daily.  . rosuvastatin (CRESTOR) 40 MG tablet Take 1 tablet (40 mg total) by mouth daily.  Marland Kitchen spironolactone (ALDACTONE) 25 MG tablet Take 1 tablet (25 mg total) by mouth daily.  . [DISCONTINUED] ferrous sulfate 325 (65 FE) MG tablet Take 1 tablet (325 mg total) by mouth 2 (two) times daily with a meal.  . [DISCONTINUED] doxycycline (VIBRA-TABS) 100 MG tablet Take 1 tablet (100 mg total) by mouth 2 (two) times daily with a meal.  . [DISCONTINUED] escitalopram (LEXAPRO) 20 MG tablet Take 20 mg by mouth daily.  . [DISCONTINUED] HYDROcodone-acetaminophen (NORCO) 5-325 MG per tablet Take 1 tablet by mouth every 4 (four) hours as needed for moderate pain.  . [DISCONTINUED] magnesium oxide (MAG-OX) 400 (241.3 MG) MG tablet Take 1 tablet (400 mg total) by mouth daily. (Patient not taking: Reported on 03/06/2015)   No facility-administered encounter medications on file as of 03/24/2015.      Review of Systems  Constitutional: Negative.   HENT: Negative.   Respiratory: Negative.   Cardiovascular: Negative.   Neurological: Negative.   Psychiatric/Behavioral: Negative.        Objective:   Physical Exam  Constitutional: She is oriented to person, place, and time. She appears well-developed and well-nourished.  Cardiovascular: Normal rate and regular rhythm.   Pulmonary/Chest: Effort normal and breath sounds normal.  Neurological: She is alert and oriented to person, place, and time.  Psychiatric: She has a  normal mood and affect. Her behavior is normal.     BP 79/49 mmHg  Pulse 82  Temp(Src) 98.4 F (36.9 C) (Oral)  Ht 5\' 2"  (1.575 m)  Wt 127 lb (57.607 kg)  BMI 23.22 kg/m2      Assessment & Plan:  1. Chronic combined systolic and diastolic congestive heart failure Feeling this might upset the balance This seems to be stable at present she has low blood pressure today but I'm not inclined to change her carvedilol or spironolactone  2. Essential hypertension Blood pressure is low as stated above but she is tolerating it well will maintain it current level and have asked her to touch with cardiologist  3. DM (diabetes mellitus), type 1, uncontrolled w/neurologic complication We'll involve Tammy with control of diabetes continue on current sliding scale but increase Levemir to 44 units and give it only once a day  Wardell Honour MD

## 2015-03-30 ENCOUNTER — Ambulatory Visit: Payer: Self-pay

## 2015-04-07 ENCOUNTER — Other Ambulatory Visit: Payer: Self-pay | Admitting: Family Medicine

## 2015-04-09 ENCOUNTER — Ambulatory Visit (INDEPENDENT_AMBULATORY_CARE_PROVIDER_SITE_OTHER): Payer: BC Managed Care – PPO | Admitting: Family Medicine

## 2015-04-09 DIAGNOSIS — Z96642 Presence of left artificial hip joint: Secondary | ICD-10-CM | POA: Diagnosis not present

## 2015-04-09 DIAGNOSIS — A419 Sepsis, unspecified organism: Secondary | ICD-10-CM | POA: Diagnosis not present

## 2015-04-09 DIAGNOSIS — M84352A Stress fracture, left femur, initial encounter for fracture: Secondary | ICD-10-CM

## 2015-04-09 DIAGNOSIS — E1010 Type 1 diabetes mellitus with ketoacidosis without coma: Secondary | ICD-10-CM | POA: Diagnosis not present

## 2015-04-10 ENCOUNTER — Other Ambulatory Visit: Payer: Self-pay | Admitting: Family Medicine

## 2015-05-04 ENCOUNTER — Encounter (HOSPITAL_COMMUNITY): Payer: BC Managed Care – PPO

## 2015-05-10 ENCOUNTER — Other Ambulatory Visit: Payer: Self-pay

## 2015-05-10 NOTE — Telephone Encounter (Signed)
Last seen 03/24/15  Dr Sabra Heck  This med came over from [facility] and is not on EPIC list

## 2015-05-11 MED ORDER — ENALAPRIL MALEATE 2.5 MG PO TABS: 2.5000 mg | ORAL_TABLET | Freq: Every day | ORAL | Status: DC

## 2015-05-15 ENCOUNTER — Other Ambulatory Visit (HOSPITAL_COMMUNITY): Payer: Self-pay | Admitting: Anesthesiology

## 2015-06-13 ENCOUNTER — Encounter (INDEPENDENT_AMBULATORY_CARE_PROVIDER_SITE_OTHER): Payer: BC Managed Care – PPO | Admitting: Ophthalmology

## 2015-06-13 ENCOUNTER — Other Ambulatory Visit: Payer: Self-pay | Admitting: Family Medicine

## 2015-06-16 ENCOUNTER — Ambulatory Visit (HOSPITAL_COMMUNITY)
Admission: RE | Admit: 2015-06-16 | Discharge: 2015-06-16 | Disposition: A | Discharge status: Home / Self Care | Payer: Medicare (Managed Care) | Source: Ambulatory Visit | Attending: Internal Medicine | Admitting: Internal Medicine

## 2015-06-22 ENCOUNTER — Encounter: Payer: Self-pay | Admitting: *Deleted

## 2015-07-12 ENCOUNTER — Telehealth: Payer: Self-pay | Admitting: Nurse Practitioner

## 2015-07-19 ENCOUNTER — Other Ambulatory Visit: Payer: Self-pay | Admitting: Family Medicine

## 2015-08-08 ENCOUNTER — Other Ambulatory Visit: Payer: Self-pay | Admitting: Family Medicine

## 2015-08-08 ENCOUNTER — Other Ambulatory Visit (HOSPITAL_COMMUNITY): Payer: Self-pay | Admitting: Internal Medicine

## 2015-08-08 NOTE — Telephone Encounter (Signed)
Patient aware that medication has been sent in.  °

## 2015-08-08 NOTE — Telephone Encounter (Signed)
Last seen 03/24/15  Dr Sabra Heck  Do not see a Lipid in [facility]

## 2015-08-14 ENCOUNTER — Other Ambulatory Visit: Payer: Self-pay | Admitting: Family Medicine

## 2015-08-21 ENCOUNTER — Other Ambulatory Visit: Payer: Self-pay | Admitting: Family Medicine

## 2015-08-21 NOTE — Telephone Encounter (Signed)
Last seen 03/23/15  Dr Sabra Heck

## 2015-08-22 ENCOUNTER — Other Ambulatory Visit: Payer: Self-pay | Admitting: Family Medicine

## 2015-09-06 ENCOUNTER — Other Ambulatory Visit: Payer: Self-pay | Admitting: Family Medicine

## 2015-09-06 NOTE — Telephone Encounter (Signed)
Last seen 03/24/15 Dr Sabra Heck  Don't see a lipid in EPIC

## 2015-09-29 ENCOUNTER — Encounter: Payer: Self-pay | Admitting: *Deleted

## 2015-10-03 ENCOUNTER — Ambulatory Visit (INDEPENDENT_AMBULATORY_CARE_PROVIDER_SITE_OTHER): Payer: Medicare Other | Admitting: Family Medicine

## 2015-10-03 ENCOUNTER — Encounter: Payer: Self-pay | Admitting: Family Medicine

## 2015-10-03 VITALS — BP 109/62 | HR 70 | Temp 98.0°F | Ht 62.0 in | Wt 147.2 lb

## 2015-10-03 DIAGNOSIS — E1040 Type 1 diabetes mellitus with diabetic neuropathy, unspecified: Secondary | ICD-10-CM

## 2015-10-03 DIAGNOSIS — N390 Urinary tract infection, site not specified: Secondary | ICD-10-CM

## 2015-10-03 DIAGNOSIS — E1065 Type 1 diabetes mellitus with hyperglycemia: Secondary | ICD-10-CM

## 2015-10-03 DIAGNOSIS — IMO0002 Reserved for concepts with insufficient information to code with codable children: Secondary | ICD-10-CM

## 2015-10-03 LAB — POCT URINALYSIS DIPSTICK
BILIRUBIN UA: NEGATIVE
GLUCOSE UA: NEGATIVE
Ketones, UA: NEGATIVE
NITRITE UA: NEGATIVE
Spec Grav, UA: <=1.005
Urobilinogen, UA: NEGATIVE
pH, UA: 6.5

## 2015-10-03 LAB — POCT GLYCOSYLATED HEMOGLOBIN (HGB A1C): HEMOGLOBIN A1C: 10.8

## 2015-10-03 LAB — POCT UA - MICROSCOPIC ONLY
CASTS, UR, LPF, POC: NEGATIVE
CRYSTALS, UR, HPF, POC: NEGATIVE
MUCUS UA: NEGATIVE
Yeast, UA: NEGATIVE

## 2015-10-03 MED ORDER — CIPROFLOXACIN HCL 250 MG PO TABS: 250.0000 mg | ORAL_TABLET | Freq: Two times a day (BID) | ORAL | Status: DC

## 2015-10-03 NOTE — Progress Notes (Signed)
Subjective:    Patient ID: Bridget Martinez, female    DOB: 06/13/1951, 64 y.o.   MRN: [id number]  HPI 65 year old female with poorly controlled diabetes. She is season endocrinologist in [place] for most of her care. She has not had an A1c done since May 2016. At that time it was 8.9. Her current meds include 17 units of Levemir twice a day. I'm unsure why this is in divided doses. She also is on sliding scale but admits to not checking her sugars and just taking 10 units as coverage for meal many times. She has not been hospitalized since last summer. There was a time when she was in and out of the hospital with DKA in the last year. She tells me that she plans to go back and see her endocrinologist in the next 3 or 4 weeks. I think today she is concerned that she may have a urinary tract infection as she has some frequency and dysuria.  Patient Active Problem List   Diagnosis Date Noted  . Arterial line in place   . Sepsis ([place])   . Abscess, gluteal, right 02/06/2015  . Allergic dermatitis 02/03/2015  . Pressure ulcer 02/03/2015  . Abscess of buttock, right 02/03/2015  . Abscess of right buttock   . Diabetes ([place]) 11/19/2014  . Left displaced femoral neck fracture ([place]) 11/19/2014  . Fall   . Closed left hip fracture ([place])   . Diabetic ketoacidosis without coma associated with diabetes mellitus due to underlying condition ([place])   . Anemia 11/02/2014  . NSTEMI (non-ST elevated myocardial infarction) ([place])   . Diabetic gastroparesis associated with type 1 diabetes mellitus ([place])   . Protein-calorie malnutrition, severe ([place]) 10/27/2014  . DKA, type 1 ([place]) 10/26/2014  . Acute renal failure ([place]) 10/26/2014  . Encephalopathy acute 10/26/2014  . Hyperkalemia 10/26/2014  . Chronic combined systolic and diastolic congestive heart failure ([place]) 09/19/2014  . Hypothyroidism 09/19/2014  . Abscess of left thigh 09/15/2014  . Blood poisoning ([place])   . Diabetic ketoacidosis with coma  associated with other specified diabetes mellitus ([place])   . SIRS (systemic inflammatory response syndrome) ([place]) 07/30/2014  . Respiratory failure ([place]) 07/26/2014  . Acidosis   . Altered mental status   . Diabetic ketoacidosis with coma associated with type 2 diabetes mellitus ([place])   . Tachycardia   . Chronic systolic heart failure ([place]) 06/15/2014  . Hypoglycemia 11/27/2013  . Hypotension 11/27/2013  . Hypokalemia 11/27/2013  . Nausea with vomiting 11/19/2013  . Abdominal pain, right lower quadrant 11/18/2013  . Acute on chronic renal failure ([place]) 11/15/2013  . Acute on chronic systolic heart failure ([place]) 11/14/2013  . DM (diabetes mellitus), type 1, uncontrolled w/neurologic complication ([place]) 99991111  . Diabetic gastroparesis ([place]) 11/13/2013  . UTI (urinary tract infection) 11/11/2013  . CKD (chronic kidney disease), stage III 11/11/2013  . Yeast infection of the vagina 10/27/2013  . Myocardial infarction ([place])   . Hypertension   . Heart murmur   . DKA (diabetic ketoacidoses) ([place]) 09/03/2013  . Chronic systolic congestive heart failure, NYHA class 3 ([place]) 08/25/2011  . CAD, multiple vessel 07/27/2011  . Depression 07/25/2011  . Hyperlipidemia   . Venous stasis ulcers [facility])    Outpatient Encounter Prescriptions as of 10/03/2015  Medication Sig  . aspirin EC 81 MG tablet Take 1 tablet (81 mg total) by mouth daily.  . carvedilol (COREG) 3.125 MG tablet Take 1 tablet (3.125 mg total) by mouth 2 (two) times daily  with a meal.  . carvedilol (COREG) 6.25 MG tablet TAKE 1 TABLET (6.25 MG TOTAL) BY MOUTH 2 (TWO) TIMES DAILY WITH A MEAL.  Marland Kitchen escitalopram (LEXAPRO) 10 MG tablet   . LEVEMIR FLEXTOUCH 100 UNIT/ML Pen INJECT 24 UNITS INTO THE SKIN EVERY MORNING. (Patient taking differently: INJECT 17 UNITS INTO THE SKIN EVERY MORNING.)  . levothyroxine (SYNTHROID, LEVOTHROID) 125 MCG tablet TAKE 1 TABLET (125 MCG TOTAL) BY MOUTH DAILY.  . megestrol (MEGACE) 400 MG/10ML suspension  Take 10 mLs (400 mg total) by mouth 2 (two) times daily.  . metoCLOPramide (REGLAN) 5 MG tablet Take 1 tablet (5 mg total) by mouth 3 (three) times daily before meals.  Marland Kitchen NOVOLOG FLEXPEN 100 UNIT/ML FlexPen Before each meal 3 times a day, 140-199 - 2 units, 200-250 - 4 units, 251-299 - 6 units,  300-349 - 8 units,  350 or above 10 units. Insulin PEN if approved, provide syringes and needles if needed. DX E 11.65 (Patient taking differently: Inject 1-22 Units into the skin 3 (three) times daily with meals. Before each meal 3 times a day, 140-199 - 2 units, 200-250 - 4 units, 251-299 - 6 units,  300-349 - 8 units,  350 or above 10 units. Insulin PEN if approved, provide syringes and needles if needed. DX E 11.65)  . ondansetron (ZOFRAN ODT) 8 MG disintegrating tablet Take 1 tablet (8 mg total) by mouth every 8 (eight) hours as needed for nausea or vomiting.  . pantoprazole (PROTONIX) 40 MG tablet TAKE 1 TABLET (40 MG TOTAL) BY MOUTH DAILY.  . rosuvastatin (CRESTOR) 40 MG tablet TAKE 1 TABLET (40 MG TOTAL) BY MOUTH DAILY.  Marland Kitchen spironolactone (ALDACTONE) 25 MG tablet TAKE 1 TABLET (25 MG TOTAL) BY MOUTH DAILY.  Marland Kitchen enalapril (VASOTEC) 2.5 MG tablet TAKE 1 TABLET (2.5 MG TOTAL) BY MOUTH DAILY.  . [DISCONTINUED] buPROPion (WELLBUTRIN XL) 150 MG 24 hr tablet TAKE 1 TABLET (150 MG TOTAL) BY MOUTH 2 (TWO) TIMES DAILY.  . [DISCONTINUED] LEVEMIR FLEXTOUCH 100 UNIT/ML Pen Inject 30 Units into the skin every morning. (Patient not taking: Reported on 10/03/2015)  . [DISCONTINUED] LEVEMIR FLEXTOUCH 100 UNIT/ML Pen INJECT 24 UNITS INTO THE SKIN EVERY MORNING.   No facility-administered encounter medications on file as of 10/03/2015.      Review of Systems  Constitutional: Negative.   Respiratory: Negative.   Cardiovascular: Negative.   Genitourinary: Positive for dysuria and frequency.  Neurological: Negative.   Psychiatric/Behavioral: Negative.        Objective:   Physical Exam  Constitutional: She is  oriented to person, place, and time. She appears well-developed and well-nourished.  Cardiovascular: Normal rate and regular rhythm.   Pulmonary/Chest: Effort normal and breath sounds normal.  Abdominal: Soft.  Neurological: She is alert and oriented to person, place, and time.          Assessment & Plan:  1. Uncontrolled type 1 diabetes with diabetic neuropathy (HCC) 1C elevated at 10.8 today. Since she sees her endocrinologist I do not want to contradict anything he is telling her so will depend on her seeing him in the near future to refine management. - POCT glycosylated hemoglobin (Hb A1C) - POCT UA - Microscopic Only - POCT urinalysis dipstick - Urine culture  2. Urinary tract infection, site not specified Analysis is normal abnormal with pyuria. We'll plan to culture and begin Cipro pending results of culture and sensitivity  Wardell Honour MD

## 2015-10-06 LAB — URINE CULTURE

## 2015-10-19 ENCOUNTER — Telehealth: Payer: Self-pay

## 2015-10-19 NOTE — Telephone Encounter (Signed)
DWM, is this an appropriate switch with the patient's diagnosis?

## 2015-10-19 NOTE — Telephone Encounter (Signed)
Discuss with clinical pharmacy regarding the proper switch that insurance will pay for

## 2015-10-19 NOTE — Telephone Encounter (Signed)
Insurance non covered Novolog Flexpen   They will cover Humalog Kwikpen   (Dr Sabra Heck pt.)

## 2015-10-30 ENCOUNTER — Telehealth: Payer: Self-pay | Admitting: Family Medicine

## 2015-10-30 NOTE — Telephone Encounter (Signed)
Stp and advised according to documentation she completed the Cipro which would have taken care of the UTI. Advised pt she would ntbs to evaluate and collect urine sample. Offered pt an appt but she declined.

## 2015-11-03 ENCOUNTER — Ambulatory Visit (HOSPITAL_COMMUNITY)
Admission: RE | Admit: 2015-11-03 | Discharge: 2015-11-03 | Disposition: A | Discharge status: Home / Self Care | Payer: Medicare Other | Source: Ambulatory Visit | Attending: Internal Medicine | Admitting: Internal Medicine

## 2015-11-03 VITALS — BP 128/58 | HR 73 | Wt 147.8 lb

## 2015-11-03 DIAGNOSIS — E785 Hyperlipidemia, unspecified: Secondary | ICD-10-CM | POA: Diagnosis not present

## 2015-11-03 DIAGNOSIS — Z7982 Long term (current) use of aspirin: Secondary | ICD-10-CM | POA: Diagnosis not present

## 2015-11-03 DIAGNOSIS — N189 Chronic kidney disease, unspecified: Secondary | ICD-10-CM | POA: Insufficient documentation

## 2015-11-03 DIAGNOSIS — F329 Major depressive disorder, single episode, unspecified: Secondary | ICD-10-CM | POA: Insufficient documentation

## 2015-11-03 DIAGNOSIS — I5042 Chronic combined systolic (congestive) and diastolic (congestive) heart failure: Secondary | ICD-10-CM

## 2015-11-03 DIAGNOSIS — I2510 Atherosclerotic heart disease of native coronary artery without angina pectoris: Secondary | ICD-10-CM

## 2015-11-03 DIAGNOSIS — I252 Old myocardial infarction: Secondary | ICD-10-CM | POA: Diagnosis not present

## 2015-11-03 DIAGNOSIS — E039 Hypothyroidism, unspecified: Secondary | ICD-10-CM | POA: Insufficient documentation

## 2015-11-03 DIAGNOSIS — I255 Ischemic cardiomyopathy: Secondary | ICD-10-CM | POA: Diagnosis not present

## 2015-11-03 DIAGNOSIS — I130 Hypertensive heart and chronic kidney disease with heart failure and stage 1 through stage 4 chronic kidney disease, or unspecified chronic kidney disease: Secondary | ICD-10-CM | POA: Diagnosis not present

## 2015-11-03 DIAGNOSIS — I5022 Chronic systolic (congestive) heart failure: Secondary | ICD-10-CM | POA: Insufficient documentation

## 2015-11-03 DIAGNOSIS — K219 Gastro-esophageal reflux disease without esophagitis: Secondary | ICD-10-CM | POA: Insufficient documentation

## 2015-11-03 DIAGNOSIS — Z794 Long term (current) use of insulin: Secondary | ICD-10-CM | POA: Diagnosis not present

## 2015-11-03 DIAGNOSIS — E1122 Type 2 diabetes mellitus with diabetic chronic kidney disease: Secondary | ICD-10-CM | POA: Insufficient documentation

## 2015-11-03 DIAGNOSIS — Z951 Presence of aortocoronary bypass graft: Secondary | ICD-10-CM | POA: Diagnosis not present

## 2015-11-03 DIAGNOSIS — Z79899 Other long term (current) drug therapy: Secondary | ICD-10-CM | POA: Insufficient documentation

## 2015-11-03 DIAGNOSIS — F419 Anxiety disorder, unspecified: Secondary | ICD-10-CM | POA: Insufficient documentation

## 2015-11-03 LAB — BASIC METABOLIC PANEL WITH GFR
Anion gap: 11 (ref 5–15)
BUN: 23 mg/dL — ABNORMAL HIGH (ref 6–20)
CO2: 23 mmol/L (ref 22–32)
Calcium: 9.7 mg/dL (ref 8.9–10.3)
Chloride: 108 mmol/L (ref 101–111)
Creatinine, Ser: 0.95 mg/dL (ref 0.44–1.00)
GFR calc Af Amer: >60 mL/min
GFR calc non Af Amer: >60 mL/min
Glucose, Bld: 91 mg/dL (ref 65–99)
Potassium: 4.3 mmol/L (ref 3.5–5.1)
Sodium: 142 mmol/L (ref 135–145)

## 2015-11-03 NOTE — Addendum Note (Signed)
Encounter addended by: Scarlette Calico, RN on: 11/03/2015 10:24 AM<BR>     Documentation filed: Visit Diagnoses, Dx Association, Patient Instructions Section, Orders

## 2015-11-03 NOTE — Progress Notes (Signed)
[place] VISIT Patient ID: Bridget Martinez, female   DOB: [birth year], 65 y.o.   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 Glucose on admission >700, temp 91.8 and BP 72/37. Blood cultures one out of two show coagulase-negative staph, urine cultures showed bacillus species  She has had multiple admissions for gastroparesis and hyperglycemia.  In 2/16, she was admitted with DKA, n/v, gastroparesis, and NSTEMI. Evaluated by Dr Burt Knack and not felt to be cath candidate.  She as noted to have intermittent LBBB.    In 4/16, she fell and was re-admitted with hip fracture (mechanical fall), now s/p surgery.  She went to a rehab facility.    She returns for f/u. She is now back home. Says she had to learn how to walk again. Now can do ADLs without walker or WC. Denies CP or SOB. Occasional edema. No orthopnea or PND. Not taking any diuretics. Not weighing every day. Has lost a lot of weight. BP stays ok (used to struggle with low BP)   ECHOs 11/21/11 EF 35-40% 07/30/12  EF  45-50% Severe hypokinesis of inferior posterior wall 09/21/13 EF 35-40% with grade II DD  10/29/2014 EF 30-35% Grade II DD, mild AI, PA systolic pressure 45 mmHg.   Labs 01/2013: K+ 4.0, Creatinine 1.31, BUN 35        08/2013: K+ 4.4, Cr 0.81        11/30/13: K+ 5.4, glucose 541, creatinine 0.91        11/05/2014: K 3.6 Creatinine 1.66           4/16: K 5.9 => 5.1, creatinine 0.9, HCT 30.9   ROS: All systems negative except as listed in HPI, PMH and Problem List.  Past Medical History  Diagnosis Date  . Diabetes mellitus     on insulin, with h/o DKA   . HTN (hypertension)   . Hyperlipidemia   . Venous stasis ulcers ([place])   .  Arthritis   . Depression     anxiety  . Myocardial infarction [facility])     NSTEMI 07/2011 with cardiogenic shock, s/p CABG 09/2011  . Hypertension   . Coronary artery disease     angina.  MI.   . Pneumonia        . GERD (gastroesophageal reflux disease)     Hiatal hernia.   . Hypothyroidism   . CHF (congestive heart failure) ([place])   . Tuberculosis   . Osteoporosis     Current Outpatient Prescriptions  Medication Sig Dispense Refill  . aspirin 325 MG EC tablet Take 325 mg by mouth daily.    . carvedilol (COREG) 6.25 MG tablet TAKE 1 TABLET (6.25 MG TOTAL) BY MOUTH 2 (TWO) TIMES DAILY WITH A MEAL. 60 tablet 2  . enalapril (VASOTEC) 2.5 MG tablet TAKE 1 TABLET (2.5 MG TOTAL) BY MOUTH DAILY. 90 tablet 0  . escitalopram (LEXAPRO) 10 MG tablet Take 10 mg by mouth daily.     . Insulin Detemir (LEVEMIR FLEXPEN) 100 UNIT/ML Pen Inject 17 Units into the skin 2 (two) times daily.    Marland Kitchen levothyroxine (SYNTHROID, LEVOTHROID) 125 MCG tablet TAKE 1 TABLET (125 MCG TOTAL) BY MOUTH DAILY. 30 tablet  2  . NOVOLOG FLEXPEN 100 UNIT/ML FlexPen Before each meal 3 times a day, 140-199 - 2 units, 200-250 - 4 units, 251-299 - 6 units,  300-349 - 8 units,  350 or above 10 units. Insulin PEN if approved, provide syringes and needles if needed. DX E 11.65 (Patient taking differently: Inject 1-22 Units into the skin 3 (three) times daily with meals. Before each meal 3 times a day, 140-199 - 2 units, 200-250 - 4 units, 251-299 - 6 units,  300-349 - 8 units,  350 or above 10 units. Insulin PEN if approved, provide syringes and needles if needed. DX E 11.65) 15 mL 11  . ondansetron (ZOFRAN ODT) 8 MG disintegrating tablet Take 1 tablet (8 mg total) by mouth every 8 (eight) hours as needed for nausea or vomiting. 20 tablet 1  . pantoprazole (PROTONIX) 40 MG tablet TAKE 1 TABLET (40 MG TOTAL) BY MOUTH DAILY. 30 tablet 3  . rosuvastatin (CRESTOR) 40 MG tablet TAKE 1 TABLET (40 MG TOTAL) BY MOUTH DAILY. 30 tablet 0  .  spironolactone (ALDACTONE) 25 MG tablet TAKE 1 TABLET (25 MG TOTAL) BY MOUTH DAILY. 30 tablet 1   No current facility-administered medications for this encounter.    Filed Vitals:   11/03/15 0925  BP: 128/58  Pulse: 73  Weight: 147 lb 12.8 oz (67.042 kg)  SpO2: 97%    PHYSICAL EXAM:  General:  NAD HEENT: normal Neck: supple.  No JVD.  Carotids 2+ bilaterally; no bruits. No lymphadenopathy or thryomegaly appreciated. Cor: PMI normal. Regular rate & rhythm. No rubs, gallops or murmurs. Trace edema on left Lungs: clear Abdomen: soft, nontender, nondistended. No hepatosplenomegaly. No bruits or masses. Good bowel sounds. Extremities: no cyanosis, clubbing, rash.  Neuro: alert & orientedx3, cranial nerves grossly intact. Moves all 4 extremities w/o difficulty. Affect pleasant.  ASSESSMENT & PLAN:   1) Chronic systolic HF: Ischemic cardiomyopathy.  Echo (2/16) with EF 30-35% RV normal.  NYHA II-III symptoms and volume status stable. Has been off lasix for over a year.  - Volume status ok  - Continue current Coreg.   - Continue spironolactone 25 mg daily  - Continue enalapril 2.5 bid  - Check BMET today. Has had problems with hyperkalemia in the past so I am hesitant to increase enalapril or switch to Entresto until we have BMET - Check echo and see back in 2-3 months to decide on med titration - Does not want ICD - Reinforced the need and importance of daily weights, a low sodium diet, and fluid restriction (less than 2 L a day). Instructed to call the HF clinic if weight increases more than 3 lbs overnight or 5 lbs in a week.  2) HTN: Stable today. Continue current regimen.   3) HLD: Continue statin.  4) CAD: s/p CABG x 4. No chest pain.  Reduce ASA to 81 daily and continue statin.   5) DM2: She is followed by Dr Buddy Duty.  Next appt 11/22/15 6) CKD: Check BMET today, most recent creatinine was improved.   BensimhonQuillian Quince 11/03/2015

## 2015-11-03 NOTE — Patient Instructions (Signed)
Labs today  Your physician recommends that you schedule a follow-up appointment in: 3 months with echocardiogram  

## 2015-11-08 ENCOUNTER — Other Ambulatory Visit: Payer: Self-pay | Admitting: Family Medicine

## 2015-11-11 ENCOUNTER — Other Ambulatory Visit (HOSPITAL_COMMUNITY): Payer: Self-pay | Admitting: Internal Medicine

## 2015-11-11 ENCOUNTER — Other Ambulatory Visit: Payer: Self-pay | Admitting: Family Medicine

## 2015-11-12 ENCOUNTER — Other Ambulatory Visit: Payer: Self-pay | Admitting: Family Medicine

## 2015-11-13 ENCOUNTER — Other Ambulatory Visit: Payer: Self-pay | Admitting: Family Medicine

## 2015-11-13 ENCOUNTER — Other Ambulatory Visit: Payer: Self-pay

## 2015-11-13 MED ORDER — ENALAPRIL MALEATE 2.5 MG PO TABS: ORAL_TABLET | ORAL | Status: DC

## 2015-12-11 ENCOUNTER — Other Ambulatory Visit: Payer: Self-pay | Admitting: Family Medicine

## 2015-12-13 ENCOUNTER — Other Ambulatory Visit: Payer: Self-pay | Admitting: Family Medicine

## 2015-12-13 NOTE — Telephone Encounter (Signed)
Last seen Dr Sabra Heck  Last thyroid 11/24/14

## 2015-12-15 ENCOUNTER — Other Ambulatory Visit: Payer: Self-pay | Admitting: *Deleted

## 2015-12-15 MED ORDER — METOCLOPRAMIDE HCL 5 MG PO TABS: ORAL_TABLET | ORAL | Status: DC

## 2015-12-15 NOTE — Telephone Encounter (Signed)
90 day supply for Reglan sent to pharmacy.

## 2015-12-25 ENCOUNTER — Other Ambulatory Visit: Payer: Self-pay

## 2015-12-25 ENCOUNTER — Telehealth: Payer: Self-pay | Admitting: Family Medicine

## 2015-12-25 MED ORDER — METOCLOPRAMIDE HCL 5 MG PO TABS: ORAL_TABLET | ORAL | Status: DC

## 2015-12-25 MED ORDER — LEVOTHYROXINE SODIUM 125 MCG PO TABS: ORAL_TABLET | ORAL | Status: DC

## 2015-12-25 NOTE — Telephone Encounter (Signed)
Corrected Rx sent to the pharmacy and pharmacy is aware to discontinue the first one.

## 2016-01-01 ENCOUNTER — Encounter: Payer: Medicare Other | Admitting: *Deleted

## 2016-01-21 ENCOUNTER — Other Ambulatory Visit: Payer: Self-pay | Admitting: Family Medicine

## 2016-01-22 NOTE — Telephone Encounter (Signed)
Last seen 10/08/15  Dr Sabra Heck  Last thyroid level 11/24/14

## 2016-01-25 ENCOUNTER — Other Ambulatory Visit: Payer: Self-pay | Admitting: Family Medicine

## 2016-02-07 ENCOUNTER — Other Ambulatory Visit (HOSPITAL_COMMUNITY): Payer: Self-pay

## 2016-02-07 ENCOUNTER — Ambulatory Visit (HOSPITAL_COMMUNITY)
Admission: RE | Admit: 2016-02-07 | Discharge: 2016-02-07 | Disposition: A | Discharge status: Home / Self Care | Payer: Medicare Other | Source: Ambulatory Visit | Attending: Family Medicine | Admitting: Family Medicine

## 2016-02-07 ENCOUNTER — Ambulatory Visit (HOSPITAL_BASED_OUTPATIENT_CLINIC_OR_DEPARTMENT_OTHER)
Admission: RE | Admit: 2016-02-07 | Discharge: 2016-02-07 | Disposition: A | Discharge status: Home / Self Care | Payer: Medicare Other | Source: Ambulatory Visit | Attending: Internal Medicine | Admitting: Internal Medicine

## 2016-02-07 VITALS — BP 142/56 | HR 73 | Wt 148.8 lb

## 2016-02-07 DIAGNOSIS — E119 Type 2 diabetes mellitus without complications: Secondary | ICD-10-CM | POA: Insufficient documentation

## 2016-02-07 DIAGNOSIS — I351 Nonrheumatic aortic (valve) insufficiency: Secondary | ICD-10-CM | POA: Insufficient documentation

## 2016-02-07 DIAGNOSIS — I340 Nonrheumatic mitral (valve) insufficiency: Secondary | ICD-10-CM | POA: Insufficient documentation

## 2016-02-07 DIAGNOSIS — I110 Hypertensive heart disease with heart failure: Secondary | ICD-10-CM | POA: Diagnosis not present

## 2016-02-07 DIAGNOSIS — I5022 Chronic systolic (congestive) heart failure: Secondary | ICD-10-CM | POA: Diagnosis not present

## 2016-02-07 DIAGNOSIS — I2510 Atherosclerotic heart disease of native coronary artery without angina pectoris: Secondary | ICD-10-CM

## 2016-02-07 DIAGNOSIS — I5042 Chronic combined systolic (congestive) and diastolic (congestive) heart failure: Secondary | ICD-10-CM | POA: Diagnosis not present

## 2016-02-07 DIAGNOSIS — E785 Hyperlipidemia, unspecified: Secondary | ICD-10-CM | POA: Insufficient documentation

## 2016-02-07 DIAGNOSIS — I509 Heart failure, unspecified: Secondary | ICD-10-CM | POA: Diagnosis present

## 2016-02-07 MED ORDER — SACUBITRIL-VALSARTAN 24-26 MG PO TABS: 1.0000 | ORAL_TABLET | Freq: Two times a day (BID) | ORAL | Status: DC

## 2016-02-07 NOTE — Progress Notes (Signed)
  Echocardiogram 2D Echocardiogram has been performed.  Jennette Dubin 02/07/2016, 10:59 AM

## 2016-02-07 NOTE — Addendum Note (Signed)
Encounter addended by: Scarlette Calico, RN on: 02/07/2016 12:32 PM<BR>     Documentation filed: Patient Instructions Section, Orders

## 2016-02-07 NOTE — Patient Instructions (Signed)
Stop Enalapril  Start Entresto 24/26 mg Twice daily on Friday 02/09/16  Labs in 2 weeks at [facility]  Your physician recommends that you schedule a follow-up appointment in: 4 months

## 2016-02-07 NOTE — Progress Notes (Signed)
Patient ID: Bridget Martinez, female   DOB: 17-Dec-1950, 65 y.o.   MRN: [id number]  [place] VISIT Patient ID: Bridget Martinez, female   DOB: 09-Oct-1950, 65 y.o.   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 Glucose on admission >700, temp 91.8 and BP 72/37. Blood cultures one out of two show coagulase-negative staph, urine cultures showed bacillus species  She has had multiple admissions for gastroparesis and hyperglycemia.  In 2/16, she was admitted with DKA, n/v, gastroparesis, and NSTEMI. Evaluated by Dr Burt Knack and not felt to be cath candidate.  She as noted to have intermittent LBBB.    In 4/16, she fell and was re-admitted with hip fracture (mechanical fall), now s/p surgery.  She went to a rehab facility.    She returns for f/u. Says she is doing ok. Not using a walker any more. Denies CP  or SOB. Occasional edema. No orthopnea or PND. Not taking any diuretics. Not weighing every day. Last HgbA1c in 3/17 was 10. BP typically 120/60-70  Echo today reviewed personally EF 35% inferior AK   ECHOs 11/21/11 EF 35-40% 07/30/12  EF  45-50% Severe hypokinesis of inferior posterior wall 09/21/13 EF 35-40% with grade II DD  10/29/2014 EF 30-35% Grade II DD, mild AI, PA systolic pressure 45 mmHg.   Labs 01/2013: K+ 4.0, Creatinine 1.31, BUN 35        08/2013: K+ 4.4, Cr 0.81        11/30/13: K+ 5.4, glucose 541, creatinine 0.91        11/05/2014: K 3.6 Creatinine 1.66           4/16: K 5.9 => 5.1, creatinine 0.9, HCT 30.9         2/17: K 4.3 creatinine 0.95   ROS: All systems negative except as listed in HPI, PMH and Problem List.  Past Medical History  Diagnosis Date  . Diabetes mellitus     on  insulin, with h/o DKA   . HTN (hypertension)   . Hyperlipidemia   . Venous stasis ulcers ([place])   . Arthritis   . Depression     anxiety  . Myocardial infarction [facility])     NSTEMI 07/2011 with cardiogenic shock, s/p CABG 09/2011  . Hypertension   . Coronary artery disease     angina.  MI.   . Pneumonia        . GERD (gastroesophageal reflux disease)     Hiatal hernia.   . Hypothyroidism   . CHF (congestive heart failure) ([place])   . Tuberculosis   . Osteoporosis     Current Outpatient Prescriptions  Medication Sig Dispense Refill  . aspirin 325 MG EC tablet Take 325 mg by mouth daily.    Marland Kitchen buPROPion (WELLBUTRIN XL) 150 MG 24 hr tablet TAKE 1 TABLET (150 MG TOTAL) BY MOUTH 2 (TWO) TIMES DAILY. 60 tablet 0  . carvedilol (COREG) 6.25 MG tablet TAKE 1 TABLET (6.25 MG TOTAL) BY MOUTH 2 (TWO) TIMES DAILY WITH A MEAL. 60 tablet 2  . enalapril (VASOTEC) 2.5 MG tablet TAKE 1 TABLET (2.5 MG TOTAL) BY MOUTH DAILY. 90 tablet 0  . enalapril (VASOTEC) 2.5 MG tablet TAKE  1 TABLET (2.5 MG TOTAL) BY MOUTH DAILY. 90 tablet 0  . escitalopram (LEXAPRO) 10 MG tablet Take 10 mg by mouth daily.     . Insulin Detemir (LEVEMIR FLEXPEN) 100 UNIT/ML Pen Inject 19 Units into the skin 2 (two) times daily.     . insulin lispro (HUMALOG) 100 UNIT/ML injection Inject 1-22 Units into the skin 3 (three) times daily before meals.    Marland Kitchen levothyroxine (SYNTHROID, LEVOTHROID) 125 MCG tablet TAKE 1 TABLET (125 MCG TOTAL) BY MOUTH DAILY. 90 tablet 0  . metoCLOPramide (REGLAN) 5 MG tablet TAKE 1 TABLET (5 MG TOTAL) BY MOUTH 3 (THREE) TIMES DAILY BEFORE MEALS. 270 tablet 1  . ondansetron (ZOFRAN ODT) 8 MG disintegrating tablet Take 1 tablet (8 mg total) by mouth every 8 (eight) hours as needed for nausea or vomiting. 20 tablet 1  . pantoprazole (PROTONIX) 40 MG tablet TAKE 1 TABLET (40 MG TOTAL) BY MOUTH DAILY. 30 tablet 5  . rosuvastatin (CRESTOR) 40 MG tablet TAKE 1 TABLET (40 MG TOTAL) BY MOUTH DAILY. 30 tablet 0  .  spironolactone (ALDACTONE) 25 MG tablet TAKE 1 TABLET (25 MG TOTAL) BY MOUTH DAILY. 30 tablet 0   No current facility-administered medications for this encounter.    Filed Vitals:   02/07/16 1132  BP: 142/56  Pulse: 73  Weight: 148 lb 12 oz (67.473 kg)  SpO2: 98%    PHYSICAL EXAM:  General:  NAD HEENT: normal Neck: supple.  No JVD.  Carotids 2+ bilaterally; no bruits. No lymphadenopathy or thryomegaly appreciated. Cor: PMI normal. Regular rate & rhythm. No rubs, gallops or murmurs. Trace edema on left Lungs: clear Abdomen: soft, nontender, nondistended. No hepatosplenomegaly. No bruits or masses. Good bowel sounds. Extremities: no cyanosis, clubbing, rash.  Neuro: alert & orientedx3, cranial nerves grossly intact. Moves all 4 extremities w/o difficulty. Affect pleasant.  ASSESSMENT & PLAN:   1) Chronic systolic HF: Ischemic cardiomyopathy.  Echo today EF 35% (stable)RV normal.  NYHA II-III symptoms and volume status stable. Has been off lasix for over a year.  - Volume status ok  - Continue current Coreg.   - Continue spironolactone 25 mg daily  - Stop enalapril and start Entresto 24/26. Check BMET in 2 weeks.  - Does not want ICD - Reinforced the need and importance of daily weights, a low sodium diet, and fluid restriction (less than 2 L a day). Instructed to call the HF clinic if weight increases more than 3 lbs overnight or 5 lbs in a week.  2) HTN: Stable today. Continue current regimen.   3) HLD: Continue statin.  4) CAD: s/p CABG x 4. No chest pain.  Reduce ASA to 81 daily and continue statin.   5) DM2: She is followed by Dr Buddy Duty. 6) CKD: Stable at last check    Glori Bickers 02/07/2016

## 2016-02-25 ENCOUNTER — Other Ambulatory Visit: Payer: Self-pay | Admitting: Family Medicine

## 2016-02-27 ENCOUNTER — Other Ambulatory Visit: Payer: Self-pay | Admitting: Family Medicine

## 2016-02-28 NOTE — Telephone Encounter (Signed)
This patient needs to have a thyroid profile but you can go ahead and refill the medication.

## 2016-02-28 NOTE — Telephone Encounter (Signed)
last seen 10/03/15 Dr Sabra Heck last thyroid 11/24/14

## 2016-03-15 ENCOUNTER — Other Ambulatory Visit (HOSPITAL_COMMUNITY): Payer: Self-pay | Admitting: Internal Medicine

## 2016-03-27 ENCOUNTER — Other Ambulatory Visit: Payer: Self-pay | Admitting: Family Medicine

## 2016-04-01 ENCOUNTER — Encounter (INDEPENDENT_AMBULATORY_CARE_PROVIDER_SITE_OTHER): Payer: Medicare Other | Admitting: Ophthalmology

## 2016-04-01 DIAGNOSIS — I10 Essential (primary) hypertension: Secondary | ICD-10-CM | POA: Diagnosis not present

## 2016-04-01 DIAGNOSIS — H2513 Age-related nuclear cataract, bilateral: Secondary | ICD-10-CM

## 2016-04-01 DIAGNOSIS — H43812 Vitreous degeneration, left eye: Secondary | ICD-10-CM | POA: Diagnosis not present

## 2016-04-01 DIAGNOSIS — E113292 Type 2 diabetes mellitus with mild nonproliferative diabetic retinopathy without macular edema, left eye: Secondary | ICD-10-CM

## 2016-04-01 DIAGNOSIS — E11319 Type 2 diabetes mellitus with unspecified diabetic retinopathy without macular edema: Secondary | ICD-10-CM

## 2016-04-01 DIAGNOSIS — H35033 Hypertensive retinopathy, bilateral: Secondary | ICD-10-CM | POA: Diagnosis not present

## 2016-04-02 ENCOUNTER — Ambulatory Visit: Payer: Medicare Other | Admitting: Family Medicine

## 2016-04-15 ENCOUNTER — Telehealth: Payer: Self-pay | Admitting: Family Medicine

## 2016-04-21 ENCOUNTER — Other Ambulatory Visit: Payer: Self-pay | Admitting: Family Medicine

## 2016-04-25 ENCOUNTER — Other Ambulatory Visit: Payer: Self-pay | Admitting: Family Medicine

## 2016-04-30 ENCOUNTER — Other Ambulatory Visit: Payer: Self-pay | Admitting: Family Medicine

## 2016-05-28 ENCOUNTER — Other Ambulatory Visit: Payer: Self-pay | Admitting: Family Medicine

## 2016-06-01 ENCOUNTER — Other Ambulatory Visit: Payer: Self-pay | Admitting: Family Medicine

## 2016-06-06 ENCOUNTER — Other Ambulatory Visit (HOSPITAL_COMMUNITY): Payer: Self-pay | Admitting: Internal Medicine

## 2016-06-07 ENCOUNTER — Encounter: Payer: Self-pay | Admitting: Family Medicine

## 2016-06-07 ENCOUNTER — Ambulatory Visit (INDEPENDENT_AMBULATORY_CARE_PROVIDER_SITE_OTHER): Payer: Medicare Other | Admitting: Family Medicine

## 2016-06-07 VITALS — BP 148/66 | HR 69 | Temp 98.6°F | Ht 62.0 in | Wt 158.0 lb

## 2016-06-07 DIAGNOSIS — I10 Essential (primary) hypertension: Secondary | ICD-10-CM | POA: Diagnosis not present

## 2016-06-07 DIAGNOSIS — E1065 Type 1 diabetes mellitus with hyperglycemia: Secondary | ICD-10-CM | POA: Diagnosis not present

## 2016-06-07 DIAGNOSIS — Z23 Encounter for immunization: Secondary | ICD-10-CM

## 2016-06-07 DIAGNOSIS — E785 Hyperlipidemia, unspecified: Secondary | ICD-10-CM | POA: Diagnosis not present

## 2016-06-07 DIAGNOSIS — E1040 Type 1 diabetes mellitus with diabetic neuropathy, unspecified: Secondary | ICD-10-CM

## 2016-06-07 DIAGNOSIS — E039 Hypothyroidism, unspecified: Secondary | ICD-10-CM

## 2016-06-07 DIAGNOSIS — IMO0002 Reserved for concepts with insufficient information to code with codable children: Secondary | ICD-10-CM

## 2016-06-07 NOTE — Patient Instructions (Signed)
Medicare Annual Wellness Visit  [place] and the medical providers at [facility] strive to bring you the best medical care.  In doing so we not only want to address your current medical conditions and concerns but also to detect new conditions early and prevent illness, disease and health-related problems.    Medicare offers a yearly Wellness Visit which allows our clinical staff to assess your need for preventative services including immunizations, lifestyle education, counseling to decrease risk of preventable diseases and screening for fall risk and other medical concerns.    This visit is provided free of charge (no copay) for all Medicare recipients. The clinical pharmacists at [facility] have begun to conduct these Wellness Visits which will also include a thorough review of all your medications.    As you primary medical provider recommend that you make an appointment for your Annual Wellness Visit if you have not done so already this year.  You may set up this appointment before you leave today or you may call back ([phone]) and schedule an appointment.  Please make sure when you call that you mention that you are scheduling your Annual Wellness Visit with the clinical pharmacist so that the appointment may be made for the proper length of time.     Continue current medications. Continue good therapeutic lifestyle changes which include good diet and exercise. Fall precautions discussed with patient. If an FOBT was given today- please return it to our front desk. If you are over 50 years old - you may need Prevnar 13 or the adult Pneumonia vaccine.  **Flu shots are available--- please call and schedule a FLU-CLINIC appointment**  After your visit with us today you will receive a survey in the mail or online from [employer] regarding your care with us. Please take a moment to fill this out. Your feedback is very  important to us as you can help us better understand your patient needs as well as improve your experience and satisfaction. WE CARE ABOUT YOU!!!    

## 2016-06-07 NOTE — Progress Notes (Signed)
Subjective:    Patient ID: Bridget Martinez, female    DOB: August 08, 1951, 65 y.o.   MRN: [id number]  HPI Pt here for follow up and management of chronic medical problems which includes diabetes, hyperlipidemia and hypertension. She is taking medications regularly. Patient has recently had cataract surgery and is due for second surgery in 2 weeks. She is followed by endocrinologist in [place] who follows her diabetes and thyroid conditions. She also sees cardiologist who has given her Delene Loll and taken her off spironolactone. She has had weight gain of about 8 pounds in the last this is noted more round edema in her feet and ankles. She denies any shortness of breath    Patient Active Problem List   Diagnosis Date Noted  . Arterial line in place   . Sepsis ([place])   . Abscess, gluteal, right 02/06/2015  . Allergic dermatitis 02/03/2015  . Pressure ulcer 02/03/2015  . Abscess of buttock, right 02/03/2015  . Abscess of right buttock   . Diabetes ([place]) 11/19/2014  . Left displaced femoral neck fracture ([place]) 11/19/2014  . Fall   . Closed left hip fracture ([place])   . Diabetic ketoacidosis without coma associated with diabetes mellitus due to underlying condition ([place])   . Anemia 11/02/2014  . NSTEMI (non-ST elevated myocardial infarction) ([place])   . Diabetic gastroparesis associated with type 1 diabetes mellitus ([place])   . Protein-calorie malnutrition, severe ([place]) 10/27/2014  . DKA, type 1 ([place]) 10/26/2014  . Acute renal failure ([place]) 10/26/2014  . Encephalopathy acute 10/26/2014  . Hyperkalemia 10/26/2014  . Chronic combined systolic and diastolic congestive heart failure ([place]) 09/19/2014  . Hypothyroidism 09/19/2014  . Abscess of left thigh 09/15/2014  . Blood poisoning ([place])   . Diabetic ketoacidosis with coma associated with other specified diabetes mellitus ([place])   . SIRS (systemic inflammatory response syndrome) ([place]) 07/30/2014  . Respiratory failure ([place]) 07/26/2014  .  Acidosis   . Altered mental status   . Diabetic ketoacidosis with coma associated with type 2 diabetes mellitus ([place])   . Tachycardia   . Chronic systolic heart failure ([place]) 06/15/2014  . Hypoglycemia 11/27/2013  . Hypotension 11/27/2013  . Hypokalemia 11/27/2013  . Nausea with vomiting 11/19/2013  . Abdominal pain, right lower quadrant 11/18/2013  . Acute on chronic renal failure ([place]) 11/15/2013  . Acute on chronic systolic heart failure ([place]) 11/14/2013  . DM (diabetes mellitus), type 1, uncontrolled w/neurologic complication ([place]) 62/13/0865  . Diabetic gastroparesis ([place]) 11/13/2013  . UTI (urinary tract infection) 11/11/2013  . CKD (chronic kidney disease), stage III 11/11/2013  . Yeast infection of the vagina 10/27/2013  . Myocardial infarction ([place])   . Hypertension   . Heart murmur   . DKA (diabetic ketoacidoses) ([place]) 09/03/2013  . Chronic systolic congestive heart failure, NYHA class 3 ([place]) 08/25/2011  . CAD, multiple vessel 07/27/2011  . Depression 07/25/2011  . Hyperlipidemia   . Venous stasis ulcers [facility])    Outpatient Encounter Prescriptions as of 06/07/2016  Medication Sig  . aspirin 325 MG EC tablet Take 325 mg by mouth daily.  Marland Kitchen buPROPion (WELLBUTRIN XL) 150 MG 24 hr tablet TAKE 1 TABLET (150 MG TOTAL) BY MOUTH 2 (TWO) TIMES DAILY.  . carvedilol (COREG) 6.25 MG tablet TAKE 1 TABLET (6.25 MG TOTAL) BY MOUTH 2 (TWO) TIMES DAILY WITH A MEAL.  Marland Kitchen escitalopram (LEXAPRO) 10 MG tablet TAKE 1 TABLET (10 MG TOTAL) BY MOUTH DAILY.  Marland Kitchen Insulin Detemir (LEVEMIR FLEXPEN) 100 UNIT/ML Pen Inject  20 Units into the skin 2 (two) times daily.   . insulin lispro (HUMALOG) 100 UNIT/ML injection Inject 1-22 Units into the skin 3 (three) times daily before meals.  Marland Kitchen levothyroxine (SYNTHROID, LEVOTHROID) 125 MCG tablet TAKE 1 TABLET EVERY MORNING  . metoCLOPramide (REGLAN) 5 MG tablet TAKE 1 TABLET (5 MG TOTAL) BY MOUTH 3 (THREE) TIMES DAILY BEFORE MEALS.  Marland Kitchen ondansetron (ZOFRAN ODT) 8  MG disintegrating tablet Take 1 tablet (8 mg total) by mouth every 8 (eight) hours as needed for nausea or vomiting.  . pantoprazole (PROTONIX) 40 MG tablet TAKE 1 TABLET (40 MG TOTAL) BY MOUTH DAILY.  . rosuvastatin (CRESTOR) 40 MG tablet TAKE 1 TABLET (40 MG TOTAL) BY MOUTH DAILY.  . sacubitril-valsartan (ENTRESTO) 24-26 MG Take 1 tablet by mouth 2 (two) times daily.  Marland Kitchen spironolactone (ALDACTONE) 25 MG tablet TAKE 1 TABLET (25 MG TOTAL) BY MOUTH DAILY.  . [DISCONTINUED] escitalopram (LEXAPRO) 10 MG tablet Take 10 mg by mouth daily.    No facility-administered encounter medications on file as of 06/07/2016.       Review of Systems  Constitutional: Negative.   HENT: Negative.   Eyes: Negative.   Respiratory: Negative.   Cardiovascular: Negative.   Gastrointestinal: Negative.   Endocrine: Negative.   Genitourinary: Negative.   Musculoskeletal: Negative.   Skin: Negative.   Allergic/Immunologic: Negative.   Neurological: Negative.   Hematological: Negative.   Psychiatric/Behavioral: Negative.        Objective:   Physical Exam  Constitutional: She is oriented to person, place, and time. She appears well-developed and well-nourished.  Cardiovascular: Normal rate, regular rhythm and normal heart sounds.   Pulmonary/Chest: Effort normal and breath sounds normal.  Musculoskeletal: Deformity: 2+ edema in ankles.  Neurological: She is alert and oriented to person, place, and time.  Psychiatric: She has a normal mood and affect. Her behavior is normal.    BP (!) 148/66 (BP Location: Left Arm)   Pulse 69   Temp 98.6 F (37 C) (Oral)   Ht _0  (1.575 m)   Wt 158 lb (71.7 kg)   BMI 28.90 kg/m       Assessment & Plan:  1. Uncontrolled type 1 diabetes with diabetic neuropathy [facility]) As per endocrinology in [place]. She is on a combination of short and long-acting insulins  2. Essential hypertension Blood pressure is up a little bit. She is on interest as well as  carvedilol - CMP14+EGFR  3. Hypothyroidism, unspecified hypothyroidism type Per her endocrinologist who manages thyroid supple  - TSH  4. Hyperlipidemia We will check lipids today it has been 1 year. Last LDL was 57. - Lipid panel  5. Encounter for immunization  - Flu Vaccine QUAD 36+ mos IM  Wardell Honour MD

## 2016-06-08 LAB — CMP14+EGFR
A/G RATIO: 1.8 (ref 1.2–2.2)
ALK PHOS: 121 IU/L — AB (ref 39–117)
ALT: 8 IU/L (ref 0–32)
AST: 10 IU/L (ref 0–40)
Albumin: 4.3 g/dL (ref 3.6–4.8)
BUN/Creatinine Ratio: 20 (ref 12–28)
BUN: 21 mg/dL (ref 8–27)
Bilirubin Total: 0.3 mg/dL (ref 0.0–1.2)
CALCIUM: 9.2 mg/dL (ref 8.7–10.3)
CO2: 24 mmol/L (ref 18–29)
CREATININE: 1.07 mg/dL — AB (ref 0.57–1.00)
Chloride: 99 mmol/L (ref 96–106)
GFR calc Af Amer: 63 mL/min/{1.73_m2} (ref >59)
GFR calc non Af Amer: 54 mL/min/{1.73_m2} — ABNORMAL LOW (ref >59)
GLOBULIN, TOTAL: 2.4 g/dL (ref 1.5–4.5)
Glucose: 152 mg/dL — ABNORMAL HIGH (ref 65–99)
POTASSIUM: 3.9 mmol/L (ref 3.5–5.2)
SODIUM: 139 mmol/L (ref 134–144)
Total Protein: 6.7 g/dL (ref 6.0–8.5)

## 2016-06-08 LAB — LIPID PANEL
CHOLESTEROL TOTAL: 187 mg/dL (ref 100–199)
Chol/HDL Ratio: 2.4 ratio units (ref 0.0–4.4)
HDL: 78 mg/dL (ref >39)
LDL CALC: 97 mg/dL (ref 0–99)
TRIGLYCERIDES: 59 mg/dL (ref 0–149)
VLDL CHOLESTEROL CAL: 12 mg/dL (ref 5–40)

## 2016-06-08 LAB — TSH: TSH: 2.29 u[IU]/mL (ref 0.450–4.500)

## 2016-07-04 ENCOUNTER — Other Ambulatory Visit (HOSPITAL_COMMUNITY): Payer: Self-pay | Admitting: *Deleted

## 2016-07-04 MED ORDER — CARVEDILOL 6.25 MG PO TABS: ORAL_TABLET | ORAL | 1 refills | Status: DC

## 2016-07-04 MED ORDER — SACUBITRIL-VALSARTAN 24-26 MG PO TABS: 1.0000 | ORAL_TABLET | Freq: Two times a day (BID) | ORAL | 1 refills | Status: DC

## 2016-07-05 ENCOUNTER — Other Ambulatory Visit: Payer: Self-pay | Admitting: Family Medicine

## 2016-07-08 ENCOUNTER — Other Ambulatory Visit: Payer: Self-pay

## 2016-07-08 MED ORDER — SPIRONOLACTONE 25 MG PO TABS: ORAL_TABLET | ORAL | 0 refills | Status: DC

## 2016-07-08 MED ORDER — LEVOTHYROXINE SODIUM 125 MCG PO TABS: 125.0000 ug | ORAL_TABLET | Freq: Every morning | ORAL | 0 refills | Status: DC

## 2016-07-15 ENCOUNTER — Other Ambulatory Visit: Payer: Self-pay | Admitting: *Deleted

## 2016-07-15 MED ORDER — ESCITALOPRAM OXALATE 10 MG PO TABS: ORAL_TABLET | ORAL | 0 refills | Status: DC

## 2016-07-15 MED ORDER — PANTOPRAZOLE SODIUM 40 MG PO TBEC: DELAYED_RELEASE_TABLET | ORAL | 0 refills | Status: DC

## 2016-07-15 NOTE — Addendum Note (Signed)
Addended by: Thana Ates on: 07/15/2016 01:47 PM   Modules accepted: Orders

## 2016-07-26 ENCOUNTER — Other Ambulatory Visit: Payer: Self-pay | Admitting: Family Medicine

## 2016-07-29 ENCOUNTER — Encounter: Payer: Medicare Other | Admitting: *Deleted

## 2016-08-06 ENCOUNTER — Ambulatory Visit (HOSPITAL_COMMUNITY)
Admission: RE | Admit: 2016-08-06 | Discharge: 2016-08-06 | Disposition: A | Discharge status: Home / Self Care | Payer: Medicare Other | Source: Ambulatory Visit | Attending: Cardiology | Admitting: Cardiology

## 2016-08-06 VITALS — BP 150/56 | HR 78 | Wt 149.8 lb

## 2016-08-06 DIAGNOSIS — E039 Hypothyroidism, unspecified: Secondary | ICD-10-CM | POA: Diagnosis not present

## 2016-08-06 DIAGNOSIS — Z951 Presence of aortocoronary bypass graft: Secondary | ICD-10-CM | POA: Diagnosis not present

## 2016-08-06 DIAGNOSIS — I5022 Chronic systolic (congestive) heart failure: Secondary | ICD-10-CM | POA: Insufficient documentation

## 2016-08-06 DIAGNOSIS — I255 Ischemic cardiomyopathy: Secondary | ICD-10-CM | POA: Diagnosis not present

## 2016-08-06 DIAGNOSIS — I2510 Atherosclerotic heart disease of native coronary artery without angina pectoris: Secondary | ICD-10-CM | POA: Insufficient documentation

## 2016-08-06 DIAGNOSIS — Z7982 Long term (current) use of aspirin: Secondary | ICD-10-CM | POA: Insufficient documentation

## 2016-08-06 DIAGNOSIS — I130 Hypertensive heart and chronic kidney disease with heart failure and stage 1 through stage 4 chronic kidney disease, or unspecified chronic kidney disease: Secondary | ICD-10-CM | POA: Diagnosis not present

## 2016-08-06 DIAGNOSIS — N189 Chronic kidney disease, unspecified: Secondary | ICD-10-CM | POA: Diagnosis not present

## 2016-08-06 DIAGNOSIS — N183 Chronic kidney disease, stage 3 unspecified: Secondary | ICD-10-CM

## 2016-08-06 DIAGNOSIS — Z79899 Other long term (current) drug therapy: Secondary | ICD-10-CM | POA: Diagnosis not present

## 2016-08-06 DIAGNOSIS — F329 Major depressive disorder, single episode, unspecified: Secondary | ICD-10-CM | POA: Insufficient documentation

## 2016-08-06 DIAGNOSIS — Z794 Long term (current) use of insulin: Secondary | ICD-10-CM | POA: Insufficient documentation

## 2016-08-06 DIAGNOSIS — I5042 Chronic combined systolic (congestive) and diastolic (congestive) heart failure: Secondary | ICD-10-CM | POA: Diagnosis not present

## 2016-08-06 DIAGNOSIS — E785 Hyperlipidemia, unspecified: Secondary | ICD-10-CM | POA: Insufficient documentation

## 2016-08-06 DIAGNOSIS — I10 Essential (primary) hypertension: Secondary | ICD-10-CM

## 2016-08-06 DIAGNOSIS — E1122 Type 2 diabetes mellitus with diabetic chronic kidney disease: Secondary | ICD-10-CM | POA: Insufficient documentation

## 2016-08-06 MED ORDER — SACUBITRIL-VALSARTAN 49-51 MG PO TABS: 1.0000 | ORAL_TABLET | Freq: Two times a day (BID) | ORAL | 6 refills | Status: DC

## 2016-08-06 NOTE — Patient Instructions (Signed)
INCREASE Entresto to 49/51 mg, one tab twice a day  Labs needed in 7 days  Your physician recommends that you schedule a follow-up appointment in: 3 months with Dr Haroldine Laws  Do the following things EVERYDAY: 1) Weigh yourself in the morning before breakfast. Write it down and keep it in a log. 2) Take your medicines as prescribed 3) Eat low salt foods-Limit salt (sodium) to 2000 mg per day.  4) Stay as active as you can everyday 5) Limit all fluids for the day to less than 2 liters

## 2016-08-06 NOTE — Progress Notes (Signed)
[place] VISIT  Patient ID: Bridget Martinez, female   DOB: 25-Jan-1951, 65 y.o.   MRN: [id number] PCP: Dr Jennye Boroughs ([facility]) Endocrinologist: Dr Buddy Duty  HPI: Bridget Martinez is a 65 yo woman with DM2, HTN, hypothyroidism, CAD s/p CABG x 4 (09/2011), depression and CHF due to ICM with EF 30-35%  Admitted to the hospital 08/24/13 for altered mental status and was found to be severely acidotic and was intubated. Glucose on admission >700, temp 91.8 and BP 72/37. Blood cultures one out of two show coagulase-negative staph, urine cultures showed bacillus species  She has had multiple admissions for gastroparesis and hyperglycemia.  In 2/16, she was admitted with DKA, n/v, gastroparesis, and NSTEMI. Evaluated by Dr Burt Knack and not felt to be cath candidate.  She as noted to have intermittent LBBB.    In 4/16, she fell and was re-admitted with hip fracture (mechanical fall), now s/p surgery.  She went to a rehab facility.    She returns today for regular follow up. At last visit started on Entresto. No dizziness or lightheadedness No SOB on flat ground. May get a little SOB walking up a stairs. Has difficulty coming down steps with h/o hip fracture. No CP. Hasn't need lasix in well over a year No orthopnea or PND. BPs at home 110-130s.  Echo 01/2016 EF 35% inferior AK  ECHOs 11/21/11 EF 35-40% 07/30/12  EF  45-50% Severe hypokinesis of inferior posterior wall 09/21/13 EF 35-40% with grade II DD  10/29/2014 EF 30-35% Grade II DD, mild AI, PA systolic pressure 45 mmHg.   Labs 01/2013: K+ 4.0, Creatinine 1.31, BUN 35        08/2013: K+ 4.4, Cr 0.81        11/30/13: K+ 5.4, glucose 541, creatinine 0.91        11/05/2014: K 3.6 Creatinine 1.66           4/16: K 5.9 => 5.1, creatinine 0.9, HCT 30.9         2/17: K 4.3 creatinine 0.95   ROS: All systems negative except as listed in HPI, PMH and Problem List.  Past Medical History:  Diagnosis Date  . Arthritis   . CHF (congestive heart  failure) ([place])   . Coronary artery disease    angina.  MI.   . Depression    anxiety  . Diabetes mellitus    on insulin, with h/o DKA   . GERD (gastroesophageal reflux disease)    Hiatal hernia.   Marland Kitchen HTN (hypertension)   . Hyperlipidemia   . Hypertension   . Hypothyroidism   . Myocardial infarction    NSTEMI 07/2011 with cardiogenic shock, s/p CABG 09/2011  . Osteoporosis   . Pneumonia       . Tuberculosis   . Venous stasis ulcers ([facility])     Current Outpatient Prescriptions  Medication Sig Dispense Refill  . aspirin 325 MG EC tablet Take 325 mg by mouth daily.    Marland Kitchen buPROPion (WELLBUTRIN XL) 150 MG 24 hr tablet TAKE 1 TABLET (150 MG TOTAL) BY MOUTH 2 (TWO) TIMES DAILY. 60 tablet 0  . carvedilol (COREG) 6.25 MG tablet TAKE 1 TABLET (6.25 MG TOTAL) BY MOUTH 2 (TWO) TIMES DAILY WITH A MEAL. 180 tablet 1  . escitalopram (LEXAPRO) 10 MG tablet TAKE 1 TABLET (10 MG TOTAL) BY MOUTH DAILY. 90 tablet 0  . escitalopram (LEXAPRO) 10 MG tablet TAKE 1 TABLET (10 MG TOTAL) BY MOUTH DAILY. 30 tablet 5  .  Insulin Detemir (LEVEMIR FLEXPEN) 100 UNIT/ML Pen Inject 20 Units into the skin 2 (two) times daily.     . insulin lispro (HUMALOG) 100 UNIT/ML injection Inject 1-22 Units into the skin 3 (three) times daily before meals.    Marland Kitchen levothyroxine (SYNTHROID, LEVOTHROID) 125 MCG tablet Take 1 tablet (125 mcg total) by mouth every morning. 90 tablet 0  . metoCLOPramide (REGLAN) 5 MG tablet TAKE 1 TABLET (5 MG TOTAL) BY MOUTH 3 (THREE) TIMES DAILY BEFORE MEALS. 270 tablet 1  . ondansetron (ZOFRAN ODT) 8 MG disintegrating tablet Take 1 tablet (8 mg total) by mouth every 8 (eight) hours as needed for nausea or vomiting. 20 tablet 1  . pantoprazole (PROTONIX) 40 MG tablet TAKE 1 TABLET (40 MG TOTAL) BY MOUTH DAILY. 90 tablet 0  . rosuvastatin (CRESTOR) 40 MG tablet TAKE 1 TABLET (40 MG TOTAL) BY MOUTH DAILY. 30 tablet 0  . sacubitril-valsartan (ENTRESTO) 24-26 MG Take 1 tablet by mouth 2 (two) times daily. 180  tablet 1  . spironolactone (ALDACTONE) 25 MG tablet TAKE 1 TABLET (25 MG TOTAL) BY MOUTH DAILY. 90 tablet 0   No current facility-administered medications for this encounter.     Vitals:   08/06/16 1203  BP: (!) 150/56  BP Location: Right Arm  Patient Position: Sitting  Cuff Size: Normal  Pulse: 78  SpO2: 95%  Weight: 149 lb 12.8 oz (67.9 kg)   Wt Readings from Last 3 Encounters:  08/06/16 149 lb 12.8 oz (67.9 kg)  06/07/16 158 lb (71.7 kg)  02/07/16 148 lb 12 oz (67.5 kg)    PHYSICAL EXAM:  General:  NAD HEENT: normal Neck: supple.  JVD not elevated.  Carotids 2+ bilaterally; no bruits. No thyromegaly or nodule noted.  Cor: PMI normal. RRR. No M/G/R noted. No peripheral edema. Lungs: Clear, normal effort Abdomen: soft, NT, ND, no HSM. No bruits or masses. +BS  Extremities: no cyanosis, clubbing, rash.  Neuro: alert & orientedx3, cranial nerves grossly intact. Moves all 4 extremities w/o difficulty. Affect pleasant.  ASSESSMENT & PLAN:   1) Chronic systolic HF: Ischemic cardiomyopathy.  Echo 01/2016 EF 35% (stable)RV normal.  NYHA II-III symptoms and volume status stable. Has been off lasix for over a year.  - Volume status stable.  - Continue Coreg 6.25 mg BID.  - Continue spironolactone 25 mg daily  - Increase Entresto 49/51 mg BID.   - Does not want ICD - Reinforced the need and importance of daily weights, a low sodium diet, and fluid restriction (less than 2 L a day). Instructed to call the HF clinic if weight increases more than 3 lbs overnight or 5 lbs in a week.  2) HTN:  - Increasing Entresto as above.    3) HLD: Continue statin.  4) CAD: s/p CABG x 4. No chest pain.  R - Continue ASA 81 mg daily and continue statin.   5) DM2: She is followed by Dr Buddy Duty. 6) CKD:  - Stable last check. Repeat in 7-10 days with increase of Entresto.   Meds as above. Labs in 7-10 days with increase of Entresto. Follow up 3 months.    Shirley Friar,  PA-C 08/06/2016  Total time spent > 25 minutes. Over half that spent discussing the above.

## 2016-08-14 ENCOUNTER — Ambulatory Visit (HOSPITAL_COMMUNITY)
Admission: RE | Admit: 2016-08-14 | Discharge: 2016-08-14 | Disposition: A | Discharge status: Home / Self Care | Payer: Medicare Other | Source: Ambulatory Visit | Attending: Internal Medicine | Admitting: Internal Medicine

## 2016-08-14 DIAGNOSIS — I5022 Chronic systolic (congestive) heart failure: Secondary | ICD-10-CM

## 2016-08-14 LAB — BASIC METABOLIC PANEL
ANION GAP: 9 (ref 5–15)
BUN: 25 mg/dL — AB (ref 6–20)
CO2: 23 mmol/L (ref 22–32)
Calcium: 9.3 mg/dL (ref 8.9–10.3)
Chloride: 101 mmol/L (ref 101–111)
Creatinine, Ser: 1.16 mg/dL — ABNORMAL HIGH (ref 0.44–1.00)
GFR calc Af Amer: 56 mL/min — ABNORMAL LOW (ref >60)
GFR calc non Af Amer: 48 mL/min — ABNORMAL LOW (ref >60)
GLUCOSE: 307 mg/dL — AB (ref 65–99)
POTASSIUM: 4.6 mmol/L (ref 3.5–5.1)
Sodium: 133 mmol/L — ABNORMAL LOW (ref 135–145)

## 2016-09-13 ENCOUNTER — Ambulatory Visit (INDEPENDENT_AMBULATORY_CARE_PROVIDER_SITE_OTHER): Payer: Medicare Other | Admitting: Family Medicine

## 2016-09-13 ENCOUNTER — Encounter: Payer: Self-pay | Admitting: Family Medicine

## 2016-09-13 VITALS — BP 114/53 | HR 70 | Temp 97.2°F | Ht 62.0 in | Wt 152.0 lb

## 2016-09-13 DIAGNOSIS — E7800 Pure hypercholesterolemia, unspecified: Secondary | ICD-10-CM

## 2016-09-13 DIAGNOSIS — I10 Essential (primary) hypertension: Secondary | ICD-10-CM

## 2016-09-13 DIAGNOSIS — E1065 Type 1 diabetes mellitus with hyperglycemia: Secondary | ICD-10-CM | POA: Diagnosis not present

## 2016-09-13 DIAGNOSIS — R300 Dysuria: Secondary | ICD-10-CM | POA: Diagnosis not present

## 2016-09-13 DIAGNOSIS — E1040 Type 1 diabetes mellitus with diabetic neuropathy, unspecified: Secondary | ICD-10-CM

## 2016-09-13 DIAGNOSIS — R351 Nocturia: Secondary | ICD-10-CM

## 2016-09-13 DIAGNOSIS — IMO0002 Reserved for concepts with insufficient information to code with codable children: Secondary | ICD-10-CM

## 2016-09-13 LAB — URINALYSIS
Bilirubin, UA: NEGATIVE
Ketones, UA: NEGATIVE
Nitrite, UA: NEGATIVE
Protein, UA: NEGATIVE
Specific Gravity, UA: <1.005 — ABNORMAL LOW (ref 1.005–1.030)
Urobilinogen, Ur: 0.2 mg/dL (ref 0.2–1.0)
pH, UA: 5 (ref 5.0–7.5)

## 2016-09-13 MED ORDER — CIPROFLOXACIN HCL 250 MG PO TABS: 250.0000 mg | ORAL_TABLET | Freq: Two times a day (BID) | ORAL | 0 refills | Status: DC

## 2016-09-13 NOTE — Progress Notes (Signed)
Subjective:    Patient ID: Bridget Martinez, female    DOB: [birth year], 66 y.o.   MRN: [id number]  HPI Pt here for follow up and management of chronic medical problems which includes diabetes and hypertension. She is taking medication regularly. She sees cardiologist and endocrinologist who are actively participating in her care. Cardiologist recently increased her dose of Entresto. Her diabetes management is less than optimal with last A1c at 9.1. She only takes Levemir 20 units twice a day. Her chief concern today is additional area and nocturia with frequency. She does have a history of UTIs.    Patient Active Problem List   Diagnosis Date Noted  . Arterial line in place   . Sepsis ([place])   . Abscess, gluteal, right 02/06/2015  . Allergic dermatitis 02/03/2015  . Pressure ulcer 02/03/2015  . Abscess of buttock, right 02/03/2015  . Abscess of right buttock   . Diabetes ([place]) 11/19/2014  . Left displaced femoral neck fracture ([place]) 11/19/2014  . Fall   . Closed left hip fracture ([place])   . Diabetic ketoacidosis without coma associated with diabetes mellitus due to underlying condition ([place])   . Anemia 11/02/2014  . NSTEMI (non-ST elevated myocardial infarction) ([place])   . Diabetic gastroparesis associated with type 1 diabetes mellitus ([place])   . Protein-calorie malnutrition, severe ([place]) 10/27/2014  . DKA, type 1 ([place]) 10/26/2014  . Acute renal failure ([place]) 10/26/2014  . Encephalopathy acute 10/26/2014  . Hyperkalemia 10/26/2014  . Chronic combined systolic and diastolic congestive heart failure ([place]) 09/19/2014  . Hypothyroidism 09/19/2014  . Abscess of left thigh 09/15/2014  . Blood poisoning ([place])   . Diabetic ketoacidosis with coma associated with other specified diabetes mellitus ([place])   . SIRS (systemic inflammatory response syndrome) ([place]) 07/30/2014  . Respiratory failure ([place]) 07/26/2014  . Acidosis   . Altered mental status   . Diabetic ketoacidosis with coma  associated with type 2 diabetes mellitus ([place])   . Tachycardia   . Chronic systolic heart failure ([place]) 06/15/2014  . Hypoglycemia 11/27/2013  . Hypotension 11/27/2013  . Hypokalemia 11/27/2013  . Nausea with vomiting 11/19/2013  . Abdominal pain, right lower quadrant 11/18/2013  . Acute on chronic renal failure ([place]) 11/15/2013  . DM (diabetes mellitus), type 1, uncontrolled w/neurologic complication ([place]) 37/06/6268  . Diabetic gastroparesis ([place]) 11/13/2013  . UTI (urinary tract infection) 11/11/2013  . CKD (chronic kidney disease), stage III 11/11/2013  . Yeast infection of the vagina 10/27/2013  . Myocardial infarction   . Hypertension   . Heart murmur   . DKA (diabetic ketoacidoses) ([place]) 09/03/2013  . Chronic systolic congestive heart failure, NYHA class 3 ([place]) 08/25/2011  . CAD, multiple vessel 07/27/2011  . Depression 07/25/2011  . Hyperlipidemia   . Venous stasis ulcers [facility])    Outpatient Encounter Prescriptions as of 09/13/2016  Medication Sig  . aspirin 325 MG EC tablet Take 325 mg by mouth daily.  Marland Kitchen buPROPion (WELLBUTRIN XL) 150 MG 24 hr tablet TAKE 1 TABLET (150 MG TOTAL) BY MOUTH 2 (TWO) TIMES DAILY.  . carvedilol (COREG) 6.25 MG tablet TAKE 1 TABLET (6.25 MG TOTAL) BY MOUTH 2 (TWO) TIMES DAILY WITH A MEAL.  Marland Kitchen escitalopram (LEXAPRO) 10 MG tablet TAKE 1 TABLET (10 MG TOTAL) BY MOUTH DAILY.  Marland Kitchen Insulin Detemir (LEVEMIR FLEXPEN) 100 UNIT/ML Pen Inject 20 Units into the skin 2 (two) times daily.   . insulin lispro (HUMALOG) 100 UNIT/ML injection Inject 1-22 Units into the skin 3 (three)  times daily before meals.  Marland Kitchen levothyroxine (SYNTHROID, LEVOTHROID) 125 MCG tablet Take 1 tablet (125 mcg total) by mouth every morning.  . metoCLOPramide (REGLAN) 5 MG tablet TAKE 1 TABLET (5 MG TOTAL) BY MOUTH 3 (THREE) TIMES DAILY BEFORE MEALS.  Marland Kitchen pantoprazole (PROTONIX) 40 MG tablet TAKE 1 TABLET (40 MG TOTAL) BY MOUTH DAILY.  . rosuvastatin (CRESTOR) 40 MG tablet TAKE 1 TABLET (40 MG  TOTAL) BY MOUTH DAILY.  . sacubitril-valsartan (ENTRESTO) 49-51 MG Take 1 tablet by mouth 2 (two) times daily.  Marland Kitchen spironolactone (ALDACTONE) 25 MG tablet TAKE 1 TABLET (25 MG TOTAL) BY MOUTH DAILY.  Marland Kitchen ondansetron (ZOFRAN ODT) 8 MG disintegrating tablet Take 1 tablet (8 mg total) by mouth every 8 (eight) hours as needed for nausea or vomiting. (Patient not taking: Reported on 09/13/2016)  . [DISCONTINUED] escitalopram (LEXAPRO) 10 MG tablet TAKE 1 TABLET (10 MG TOTAL) BY MOUTH DAILY.   No facility-administered encounter medications on file as of 09/13/2016.       Review of Systems  Constitutional: Negative.   HENT: Negative.   Eyes: Negative.   Respiratory: Negative.   Cardiovascular: Negative.   Gastrointestinal: Negative.   Endocrine: Negative.        Few BS readings - sees endocrinology this month  Genitourinary: Positive for dysuria and frequency (night time).  Musculoskeletal: Negative.   Skin: Negative.   Allergic/Immunologic: Negative.   Neurological: Negative.   Hematological: Negative.   Psychiatric/Behavioral: Negative.        Objective:   Physical Exam  Constitutional: She appears well-developed and well-nourished.  Cardiovascular: Normal rate, regular rhythm and normal heart sounds.   Pulmonary/Chest: Effort normal and breath sounds normal.   BP (!) 114/53 (BP Location: Left Arm)   Pulse 70   Temp 97.2 F (36.2 C) (Oral)   Ht 5\' 2"  (1.575 m)   Wt 152 lb (68.9 kg)   BMI 27.80 kg/m   Urinalysis is positive for blood and white cells      Assessment & Plan:  1. Uncontrolled type 1 diabetes with diabetic neuropathy [facility]) Care per her endocrinologist  2. Essential hypertension Pressure is well controlled on current medicine regimen  3. Pure hypercholesterolemia Lipids are at goal with LDL less than 100  4. Nocturia Symptoms consistent with UTI, recurrent - Urinalysis, Complete - Urine culture  5. Dysuria Begin Cipro 250 mg twice a day pending result of  culture - Urinalysis, Complete - Urine culture  Wardell Honour MD

## 2016-09-13 NOTE — Addendum Note (Signed)
Addended by: Zannie Cove on: 09/13/2016 11:04 AM   Modules accepted: Orders

## 2016-09-13 NOTE — Patient Instructions (Signed)
Medicare Annual Wellness Visit  [place] and the medical providers at [facility] strive to bring you the best medical care.  In doing so we not only want to address your current medical conditions and concerns but also to detect new conditions early and prevent illness, disease and health-related problems.    Medicare offers a yearly Wellness Visit which allows our clinical staff to assess your need for preventative services including immunizations, lifestyle education, counseling to decrease risk of preventable diseases and screening for fall risk and other medical concerns.    This visit is provided free of charge (no copay) for all Medicare recipients. The clinical pharmacists at [facility] have begun to conduct these Wellness Visits which will also include a thorough review of all your medications.    As you primary medical provider recommend that you make an appointment for your Annual Wellness Visit if you have not done so already this year.  You may set up this appointment before you leave today or you may call back ([phone]) and schedule an appointment.  Please make sure when you call that you mention that you are scheduling your Annual Wellness Visit with the clinical pharmacist so that the appointment may be made for the proper length of time.     Continue current medications. Continue good therapeutic lifestyle changes which include good diet and exercise. Fall precautions discussed with patient. If an FOBT was given today- please return it to our front desk. If you are over 50 years old - you may need Prevnar 13 or the adult Pneumonia vaccine.  **Flu shots are available--- please call and schedule a FLU-CLINIC appointment**  After your visit with us today you will receive a survey in the mail or online from [employer] regarding your care with us. Please take a moment to fill this out. Your feedback is very  important to us as you can help us better understand your patient needs as well as improve your experience and satisfaction. WE CARE ABOUT YOU!!!    

## 2016-09-15 LAB — URINE CULTURE

## 2016-09-16 ENCOUNTER — Other Ambulatory Visit: Payer: Self-pay

## 2016-09-16 MED ORDER — LEVOTHYROXINE SODIUM 125 MCG PO TABS: 125.0000 ug | ORAL_TABLET | Freq: Every morning | ORAL | 1 refills | Status: DC

## 2016-09-17 ENCOUNTER — Telehealth: Payer: Self-pay | Admitting: Family Medicine

## 2016-09-17 MED ORDER — DOXYCYCLINE HYCLATE 100 MG PO TABS: 100.0000 mg | ORAL_TABLET | Freq: Two times a day (BID) | ORAL | 0 refills | Status: DC

## 2016-09-17 NOTE — Telephone Encounter (Signed)
Patient aware of medication change due to allergy

## 2016-09-17 NOTE — Telephone Encounter (Signed)
We could use Macrobid 1 twice a day for 1 week (#14)

## 2016-09-17 NOTE — Addendum Note (Signed)
Addended by: Wardell Heath on: 09/17/2016 11:15 AM   Modules accepted: Orders

## 2016-09-17 NOTE — Telephone Encounter (Signed)
We are running out of choices. Let's try doxycycline 100 mg twice a day #14

## 2016-09-20 ENCOUNTER — Other Ambulatory Visit: Payer: Self-pay | Admitting: Family Medicine

## 2016-10-02 ENCOUNTER — Ambulatory Visit (INDEPENDENT_AMBULATORY_CARE_PROVIDER_SITE_OTHER): Payer: Medicare Other | Admitting: Ophthalmology

## 2016-10-02 DIAGNOSIS — H35033 Hypertensive retinopathy, bilateral: Secondary | ICD-10-CM | POA: Diagnosis not present

## 2016-10-02 DIAGNOSIS — E113292 Type 2 diabetes mellitus with mild nonproliferative diabetic retinopathy without macular edema, left eye: Secondary | ICD-10-CM

## 2016-10-02 DIAGNOSIS — H43813 Vitreous degeneration, bilateral: Secondary | ICD-10-CM | POA: Diagnosis not present

## 2016-10-02 DIAGNOSIS — E113391 Type 2 diabetes mellitus with moderate nonproliferative diabetic retinopathy without macular edema, right eye: Secondary | ICD-10-CM | POA: Diagnosis not present

## 2016-10-02 DIAGNOSIS — E11311 Type 2 diabetes mellitus with unspecified diabetic retinopathy with macular edema: Secondary | ICD-10-CM | POA: Diagnosis not present

## 2016-10-02 DIAGNOSIS — I10 Essential (primary) hypertension: Secondary | ICD-10-CM | POA: Diagnosis not present

## 2016-10-02 LAB — HM DIABETES EYE EXAM

## 2016-10-11 ENCOUNTER — Other Ambulatory Visit: Payer: Self-pay | Admitting: *Deleted

## 2016-10-11 MED ORDER — LEVOTHYROXINE SODIUM 125 MCG PO TABS: 125.0000 ug | ORAL_TABLET | Freq: Every morning | ORAL | 1 refills | Status: DC

## 2016-10-16 ENCOUNTER — Other Ambulatory Visit: Payer: Self-pay | Admitting: Family Medicine

## 2016-11-14 ENCOUNTER — Other Ambulatory Visit: Payer: Self-pay | Admitting: Family Medicine

## 2016-12-13 ENCOUNTER — Ambulatory Visit (HOSPITAL_COMMUNITY)
Admission: RE | Admit: 2016-12-13 | Discharge: 2016-12-13 | Disposition: A | Discharge status: Home / Self Care | Payer: Medicare Other | Source: Ambulatory Visit | Attending: Internal Medicine | Admitting: Internal Medicine

## 2016-12-13 ENCOUNTER — Encounter (HOSPITAL_COMMUNITY): Payer: Self-pay | Admitting: Internal Medicine

## 2016-12-13 VITALS — BP 142/62 | HR 70 | Wt 153.5 lb

## 2016-12-13 DIAGNOSIS — I5022 Chronic systolic (congestive) heart failure: Secondary | ICD-10-CM | POA: Diagnosis not present

## 2016-12-13 DIAGNOSIS — Z79899 Other long term (current) drug therapy: Secondary | ICD-10-CM | POA: Insufficient documentation

## 2016-12-13 DIAGNOSIS — N189 Chronic kidney disease, unspecified: Secondary | ICD-10-CM | POA: Insufficient documentation

## 2016-12-13 DIAGNOSIS — N183 Chronic kidney disease, stage 3 unspecified: Secondary | ICD-10-CM

## 2016-12-13 DIAGNOSIS — E1022 Type 1 diabetes mellitus with diabetic chronic kidney disease: Secondary | ICD-10-CM | POA: Insufficient documentation

## 2016-12-13 DIAGNOSIS — I252 Old myocardial infarction: Secondary | ICD-10-CM | POA: Insufficient documentation

## 2016-12-13 DIAGNOSIS — E039 Hypothyroidism, unspecified: Secondary | ICD-10-CM | POA: Diagnosis not present

## 2016-12-13 DIAGNOSIS — R079 Chest pain, unspecified: Secondary | ICD-10-CM

## 2016-12-13 DIAGNOSIS — F329 Major depressive disorder, single episode, unspecified: Secondary | ICD-10-CM | POA: Insufficient documentation

## 2016-12-13 DIAGNOSIS — Z794 Long term (current) use of insulin: Secondary | ICD-10-CM | POA: Insufficient documentation

## 2016-12-13 DIAGNOSIS — I130 Hypertensive heart and chronic kidney disease with heart failure and stage 1 through stage 4 chronic kidney disease, or unspecified chronic kidney disease: Secondary | ICD-10-CM | POA: Diagnosis not present

## 2016-12-13 DIAGNOSIS — M1990 Unspecified osteoarthritis, unspecified site: Secondary | ICD-10-CM | POA: Insufficient documentation

## 2016-12-13 DIAGNOSIS — K3184 Gastroparesis: Secondary | ICD-10-CM | POA: Diagnosis not present

## 2016-12-13 DIAGNOSIS — E785 Hyperlipidemia, unspecified: Secondary | ICD-10-CM | POA: Insufficient documentation

## 2016-12-13 DIAGNOSIS — F419 Anxiety disorder, unspecified: Secondary | ICD-10-CM | POA: Diagnosis not present

## 2016-12-13 DIAGNOSIS — Z951 Presence of aortocoronary bypass graft: Secondary | ICD-10-CM | POA: Insufficient documentation

## 2016-12-13 DIAGNOSIS — I2510 Atherosclerotic heart disease of native coronary artery without angina pectoris: Secondary | ICD-10-CM

## 2016-12-13 DIAGNOSIS — Z7982 Long term (current) use of aspirin: Secondary | ICD-10-CM | POA: Diagnosis not present

## 2016-12-13 DIAGNOSIS — E1043 Type 1 diabetes mellitus with diabetic autonomic (poly)neuropathy: Secondary | ICD-10-CM | POA: Insufficient documentation

## 2016-12-13 DIAGNOSIS — K219 Gastro-esophageal reflux disease without esophagitis: Secondary | ICD-10-CM | POA: Diagnosis not present

## 2016-12-13 LAB — BASIC METABOLIC PANEL
Anion gap: 7 (ref 5–15)
BUN: 26 mg/dL — AB (ref 6–20)
CHLORIDE: 108 mmol/L (ref 101–111)
CO2: 26 mmol/L (ref 22–32)
Calcium: 9.8 mg/dL (ref 8.9–10.3)
Creatinine, Ser: 1.26 mg/dL — ABNORMAL HIGH (ref 0.44–1.00)
GFR calc Af Amer: 50 mL/min — ABNORMAL LOW (ref >60)
GFR calc non Af Amer: 43 mL/min — ABNORMAL LOW (ref >60)
GLUCOSE: 97 mg/dL (ref 65–99)
Potassium: 5.4 mmol/L — ABNORMAL HIGH (ref 3.5–5.1)
Sodium: 141 mmol/L (ref 135–145)

## 2016-12-13 MED ORDER — ASPIRIN 81 MG PO TBEC: 81.0000 mg | DELAYED_RELEASE_TABLET | Freq: Every day | ORAL | 3 refills | Status: AC

## 2016-12-13 MED ORDER — SACUBITRIL-VALSARTAN 97-103 MG PO TABS: 1.0000 | ORAL_TABLET | Freq: Two times a day (BID) | ORAL | 3 refills | Status: DC

## 2016-12-13 NOTE — Addendum Note (Signed)
Encounter addended by: Harvie Junior, CMA on: 12/13/2016 10:54 AM<BR>    Actions taken: Diagnosis association updated, Order list changed, Sign clinical note

## 2016-12-13 NOTE — Progress Notes (Signed)
[facility] VISIT  Patient ID: Bridget Martinez, female   DOB: 1950-12-05, 66 y.o.   MRN: [id number] PCP: Dr Jennye Boroughs ([facility]) Endocrinologist: Dr Buddy Duty  HPI: 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 Glucose on admission >700, temp 91.8 and BP 72/37. Blood cultures one out of two show coagulase-negative staph, urine cultures showed bacillus species  She has had multiple admissions for gastroparesis and hyperglycemia.  In 2/16, she was admitted with DKA, n/v, gastroparesis, and NSTEMI. Evaluated by Dr Burt Knack and not felt to be cath candidate.  She as noted to have intermittent LBBB.    In 4/16, she fell and was re-admitted with hip fracture (mechanical fall), now s/p surgery.  She went to a rehab facility.    She returns today for regular follow up. At last visit Entresto increased to 49/51. No dizziness or lightheadedness. Says BP is still running a bit high. Denies SOB with ADLs. Has someone who helps her with cleaning. Gets SOB if she goes up a flight of steps. No CP. Hasn't need lasix in a long time.  No orthopnea or PND. Chest sore at times. Not getting worse. Non-exertional. Glucose still up.  Echo 01/2016 EF 35% inferior AK  ECHOs 11/21/11 EF 35-40% 07/30/12  EF  45-50% Severe hypokinesis of inferior posterior wall 09/21/13 EF 35-40% with grade II DD  10/29/2014 EF 30-35% Grade II DD, mild AI, PA systolic pressure 45 mmHg.   Labs 01/2013: K+ 4.0, Creatinine 1.31, BUN 35        08/2013: K+ 4.4, Cr 0.81        11/30/13: K+ 5.4, glucose 541, creatinine 0.91        11/05/2014: K 3.6 Creatinine 1.66           4/16: K 5.9 => 5.1, creatinine 0.9, HCT 30.9         2/17: K 4.3 creatinine 0.95   ROS: All systems negative except as listed in HPI, PMH and Problem  List.  Past Medical History:  Diagnosis Date  . Arthritis   . CHF (congestive heart failure) ([place])   . Coronary artery disease    angina.  MI.   . Depression    anxiety  . Diabetes mellitus    on insulin, with h/o DKA   . GERD (gastroesophageal reflux disease)    Hiatal hernia.   Marland Kitchen HTN (hypertension)   . Hyperlipidemia   . Hypertension   . Hypothyroidism   . Myocardial infarction    NSTEMI 07/2011 with cardiogenic shock, s/p CABG 09/2011  . Osteoporosis   . Pneumonia       . Tuberculosis   . Venous stasis ulcers ([facility])     Current Outpatient Prescriptions  Medication Sig Dispense Refill  . aspirin 325 MG EC tablet Take 325 mg by mouth daily.    Marland Kitchen buPROPion (WELLBUTRIN XL) 150 MG 24 hr tablet TAKE 1 TABLET (150 MG TOTAL) BY MOUTH 2 (TWO) TIMES DAILY. 60 tablet 0  . carvedilol (COREG) 6.25 MG tablet TAKE 1 TABLET (6.25 MG TOTAL) BY MOUTH 2 (TWO) TIMES DAILY WITH A MEAL. 180 tablet 1  . escitalopram (LEXAPRO) 10 MG tablet TAKE 1 TABLET (10 MG TOTAL) BY MOUTH DAILY. 90 tablet 0  . Insulin Detemir (LEVEMIR  FLEXPEN) 100 UNIT/ML Pen Inject 20 Units into the skin 2 (two) times daily.     . insulin lispro (HUMALOG) 100 UNIT/ML injection Inject 1-22 Units into the skin 3 (three) times daily before meals.    Marland Kitchen levothyroxine (SYNTHROID, LEVOTHROID) 125 MCG tablet Take 1 tablet (125 mcg total) by mouth every morning. 90 tablet 1  . metoCLOPramide (REGLAN) 5 MG tablet TAKE 1 TABLET (5 MG TOTAL) BY MOUTH 3 (THREE) TIMES DAILY BEFORE MEALS. 270 tablet 0  . pantoprazole (PROTONIX) 40 MG tablet TAKE 1 TABLET (40 MG TOTAL) BY MOUTH DAILY. 90 tablet 0  . rosuvastatin (CRESTOR) 40 MG tablet TAKE 1 TABLET (40 MG TOTAL) BY MOUTH DAILY. 30 tablet 0  . sacubitril-valsartan (ENTRESTO) 49-51 MG Take 1 tablet by mouth 2 (two) times daily. 60 tablet 6  . spironolactone (ALDACTONE) 25 MG tablet TAKE 1 TABLET (25 MG TOTAL) BY MOUTH DAILY. 90 tablet 0  . ondansetron (ZOFRAN ODT) 8 MG disintegrating tablet  Take 1 tablet (8 mg total) by mouth every 8 (eight) hours as needed for nausea or vomiting. (Patient not taking: Reported on 09/13/2016) 20 tablet 1   No current facility-administered medications for this encounter.     Vitals:   12/13/16 0932  BP: (!) 142/62  Pulse: 70  SpO2: 100%  Weight: 153 lb 8 oz (69.6 kg)   Wt Readings from Last 3 Encounters:  12/13/16 153 lb 8 oz (69.6 kg)  09/13/16 152 lb (68.9 kg)  08/06/16 149 lb 12.8 oz (67.9 kg)    PHYSICAL EXAM:  General:  Well appearing. No resp difficulty HEENT: normal Neck: supple. JVP 6. Carotids 2+ bilat; no bruits. No lymphadenopathy or thryomegaly appreciated. Cor: PMI nondisplaced. Regular rate & rhythm. No rubs, gallops or murmurs. Lungs: clear Abdomen: obese. soft, nontender, nondistended. No hepatosplenomegaly. No bruits or masses. Good bowel sounds. Extremities: no cyanosis, clubbing, rash, edema Neuro: alert & orientedx3, cranial nerves grossly intact. moves all 4 extremities w/o difficulty. Affect pleasant   ASSESSMENT & PLAN:   1) Chronic systolic HF: Ischemic cardiomyopathy.  Echo 01/2016 EF 35% (stable)RV normal.  NYHA II-III symptoms and volume status stable. Has been off lasix for over a year.  - Volume status stable. NYHA II-III - Continue Coreg 6.25 mg BID.  - Continue spironolactone 25 mg daily  - Increase Entresto 97/103 mg BID.   - Does not want ICD - Reinforced the need and importance of daily weights, a low sodium diet, and fluid restriction (less than 2 L a day). Instructed to call the HF clinic if weight increases more than 3 lbs overnight or 5 lbs in a week.  - [facility]  2) HTN:  - BP up. Will increase Entresto to 97/103 bid 3) HLD:  - Continue statin. Followed by [facility]. Goal LDL < 70.  4) CAD: s/p CABG x 4. No chest pain.  R - has occasional chest discomfort. With DM and 66 year old CABG grafts will get Myoview - decrease ASA to 81mg  daily and  continue statin.   5) DM, type I: She is followed by Dr Buddy Duty. - Initially discussed Jardiance but does not qualify with DM-1 6) CKD:  - Check BMET today  Glori Bickers, MD  10:36 AM

## 2016-12-13 NOTE — Addendum Note (Signed)
Encounter addended by: Harvie Junior, CMA on: 12/13/2016 10:55 AM<BR>    Actions taken: Sign clinical note

## 2016-12-13 NOTE — Patient Instructions (Addendum)
DECREASE Aspirin to 81mg  daily  INCREASE Entresto to 97/103mg  twice daily  Routine lab work today. Will notify you of abnormal results  Your provider requests you have a Lexiscan (myoview)  ____________________________________________   ____________________________________________   ____________________________________________  Follow up with Dr.[name] in 6 months. **Please call our office at [phone] in September to schedule yuur follow up appointment**

## 2016-12-17 ENCOUNTER — Telehealth (HOSPITAL_COMMUNITY): Payer: Self-pay | Admitting: *Deleted

## 2016-12-17 ENCOUNTER — Other Ambulatory Visit (HOSPITAL_COMMUNITY): Payer: Self-pay | Admitting: *Deleted

## 2016-12-17 DIAGNOSIS — I5022 Chronic systolic (congestive) heart failure: Secondary | ICD-10-CM

## 2016-12-17 MED ORDER — SPIRONOLACTONE 25 MG PO TABS: 12.5000 mg | ORAL_TABLET | Freq: Once | ORAL | 3 refills | Status: DC

## 2016-12-17 NOTE — Telephone Encounter (Signed)
Patient given detailed instructions per Myocardial Perfusion Study Information Sheet for the test on 12/19/16 Patient notified to arrive 15 minutes early and that it is imperative to arrive on time for appointment to keep from having the test rescheduled.  If you need to cancel or reschedule your appointment, please call the office within 24 hours of your appointment. Failure to do so may result in a cancellation of your appointment, and a $50 no show fee. Patient verbalized understanding. Kirstie Peri

## 2016-12-17 NOTE — Telephone Encounter (Signed)
Notes recorded by Harvie Junior, CMA on 12/17/2016 at 2:47 PM EDT Pt aware of lab results added to lab schedule 4/17 ------  Notes recorded by Jolaine Artist, MD on 12/13/2016 at 5:10 PM EDT Cut spiro to 12.5. Repeat next week.    Ref Range & Units 4d ago 62mo ago 80mo ago   Sodium 135 - 145 mmol/L 141  133   139R    Potassium 3.5 - 5.1 mmol/L 5.4   4.6  3.9R    Chloride 101 - 111 mmol/L 108  101  99R    CO2 22 - 32 mmol/L 26  23  24R    Glucose, Bld 65 - 99 mg/dL 97  307   152     BUN 6 - 20 mg/dL 26   25   21R    Creatinine, Ser 0.44 - 1.00 mg/dL 1.26   1.16   1.07R     Calcium 8.9 - 10.3 mg/dL 9.8  9.3  9.2R    GFR calc non Af Amer >60 mL/min 43   48   54R     GFR calc Af Amer >60 mL/min 50   56CM   63R

## 2016-12-19 ENCOUNTER — Ambulatory Visit (HOSPITAL_COMMUNITY): Payer: Medicare Other | Attending: Internal Medicine

## 2016-12-19 DIAGNOSIS — I5022 Chronic systolic (congestive) heart failure: Secondary | ICD-10-CM | POA: Diagnosis not present

## 2016-12-19 DIAGNOSIS — I119 Hypertensive heart disease without heart failure: Secondary | ICD-10-CM | POA: Insufficient documentation

## 2016-12-19 DIAGNOSIS — Z951 Presence of aortocoronary bypass graft: Secondary | ICD-10-CM | POA: Insufficient documentation

## 2016-12-19 DIAGNOSIS — E109 Type 1 diabetes mellitus without complications: Secondary | ICD-10-CM | POA: Insufficient documentation

## 2016-12-19 DIAGNOSIS — R9439 Abnormal result of other cardiovascular function study: Secondary | ICD-10-CM | POA: Diagnosis not present

## 2016-12-19 DIAGNOSIS — R0609 Other forms of dyspnea: Secondary | ICD-10-CM | POA: Insufficient documentation

## 2016-12-19 DIAGNOSIS — I2510 Atherosclerotic heart disease of native coronary artery without angina pectoris: Secondary | ICD-10-CM | POA: Insufficient documentation

## 2016-12-19 LAB — MYOCARDIAL PERFUSION IMAGING
CHL CUP NUCLEAR SDS: 3
CHL CUP RESTING HR STRESS: 67 {beats}/min
CSEPPHR: 86 {beats}/min
LHR: 0.34
LVDIAVOL: 195 mL (ref 46–106)
LVSYSVOL: 141 mL
SRS: 12
SSS: 13
TID: 0.97

## 2016-12-19 MED ORDER — REGADENOSON 0.4 MG/5ML IV SOLN
0.4000 mg | Freq: Once | INTRAVENOUS | Status: AC
  Administered 2016-12-19: 0.4 mg via INTRAVENOUS

## 2016-12-19 MED ORDER — TECHNETIUM TC 99M TETROFOSMIN IV KIT
31.3000 | PACK | Freq: Once | INTRAVENOUS | Status: AC | PRN
  Administered 2016-12-19: 31.3 via INTRAVENOUS
  Filled 2016-12-19: qty 32

## 2016-12-19 MED ORDER — TECHNETIUM TC 99M TETROFOSMIN IV KIT
10.6000 | PACK | Freq: Once | INTRAVENOUS | Status: AC | PRN
  Administered 2016-12-19: 10.6 via INTRAVENOUS
  Filled 2016-12-19: qty 11

## 2016-12-24 ENCOUNTER — Ambulatory Visit (HOSPITAL_COMMUNITY)
Admission: RE | Admit: 2016-12-24 | Discharge: 2016-12-24 | Disposition: A | Discharge status: Home / Self Care | Payer: Medicare Other | Source: Ambulatory Visit | Attending: Internal Medicine | Admitting: Internal Medicine

## 2016-12-24 DIAGNOSIS — I5022 Chronic systolic (congestive) heart failure: Secondary | ICD-10-CM

## 2016-12-24 LAB — BASIC METABOLIC PANEL
ANION GAP: 11 (ref 5–15)
BUN: 26 mg/dL — ABNORMAL HIGH (ref 6–20)
CHLORIDE: 106 mmol/L (ref 101–111)
CO2: 25 mmol/L (ref 22–32)
Calcium: 9.7 mg/dL (ref 8.9–10.3)
Creatinine, Ser: 1.24 mg/dL — ABNORMAL HIGH (ref 0.44–1.00)
GFR calc Af Amer: 51 mL/min — ABNORMAL LOW (ref >60)
GFR, EST NON AFRICAN AMERICAN: 44 mL/min — AB (ref >60)
Glucose, Bld: 114 mg/dL — ABNORMAL HIGH (ref 65–99)
POTASSIUM: 4.8 mmol/L (ref 3.5–5.1)
SODIUM: 142 mmol/L (ref 135–145)

## 2017-01-10 LAB — MICROALBUMIN, URINE: MICROALB UR: 14.31

## 2017-01-10 LAB — HEMOGLOBIN A1C: HEMOGLOBIN A1C: 10

## 2017-01-16 ENCOUNTER — Other Ambulatory Visit: Payer: Self-pay | Admitting: Family Medicine

## 2017-01-16 ENCOUNTER — Other Ambulatory Visit (HOSPITAL_COMMUNITY): Payer: Self-pay | Admitting: Internal Medicine

## 2017-01-31 ENCOUNTER — Telehealth: Payer: Self-pay | Admitting: Pharmacist

## 2017-01-31 NOTE — Telephone Encounter (Signed)
No A1c on file since 09/2015.  Called patient and verified she sees Dr Delrae Rend for DM management.  She OK's our office requesting lab records from 02/2016 through present.  Patient also in need of getting new provider since Dr Sabra Heck is retired.  Appt made with Dr Wendi Snipes for 02/10/17.

## 2017-02-10 ENCOUNTER — Encounter: Payer: Self-pay | Admitting: Family Medicine

## 2017-02-10 ENCOUNTER — Ambulatory Visit (INDEPENDENT_AMBULATORY_CARE_PROVIDER_SITE_OTHER): Payer: Medicare Other | Admitting: Family Medicine

## 2017-02-10 VITALS — BP 139/58 | HR 70 | Temp 97.9°F | Ht 62.0 in | Wt 157.0 lb

## 2017-02-10 DIAGNOSIS — I5022 Chronic systolic (congestive) heart failure: Secondary | ICD-10-CM

## 2017-02-10 DIAGNOSIS — I10 Essential (primary) hypertension: Secondary | ICD-10-CM | POA: Diagnosis not present

## 2017-02-10 DIAGNOSIS — F329 Major depressive disorder, single episode, unspecified: Secondary | ICD-10-CM | POA: Diagnosis not present

## 2017-02-10 DIAGNOSIS — K219 Gastro-esophageal reflux disease without esophagitis: Secondary | ICD-10-CM | POA: Insufficient documentation

## 2017-02-10 DIAGNOSIS — F32A Depression, unspecified: Secondary | ICD-10-CM

## 2017-02-10 MED ORDER — PANTOPRAZOLE SODIUM 40 MG PO TBEC: DELAYED_RELEASE_TABLET | ORAL | 3 refills | Status: DC

## 2017-02-10 MED ORDER — ONDANSETRON 8 MG PO TBDP: 8.0000 mg | ORAL_TABLET | Freq: Three times a day (TID) | ORAL | 1 refills | Status: DC | PRN

## 2017-02-10 MED ORDER — ESCITALOPRAM OXALATE 20 MG PO TABS: ORAL_TABLET | ORAL | 1 refills | Status: DC

## 2017-02-10 MED ORDER — ROSUVASTATIN CALCIUM 40 MG PO TABS: ORAL_TABLET | ORAL | 3 refills | Status: DC

## 2017-02-10 MED ORDER — BUPROPION HCL ER (XL) 150 MG PO TB24: ORAL_TABLET | ORAL | 1 refills | Status: DC

## 2017-02-10 NOTE — Progress Notes (Signed)
   HPI  Patient presents today here to follow-up for chronic medical conditions and establish care, her previous PCP has retired.  Patient feels well and has no complaints except for anhedonia.  Patient has been taking Lexapro plus Wellbutrin for years, she needs a refill. Denies suicidal thoughts. States that she does have some moderate anhedonia keeping her from being is involved in activities that she was previously. To increase her medication of possible  Hypertension Good medication compliance Blood pressures usually in the 130s when checked at home or other clinics. No chest pain or dyspnea, palpitations.  Systolic CHF Patient is followed in the CHF clinic, very good medication compliance. She feels that she is doing much better on [employer]- daily symps without PPI, needs refill.   HLD- needs refill of statin. Labs collected in september  PMH: Smoking status noted ROS: Per HPI  Objective: BP (!) 139/58   Pulse 70   Temp 97.9 F (36.6 C) (Oral)   Ht 5\' 2"  (1.575 m)   Wt 157 lb (71.2 kg)   BMI 28.72 kg/m  Gen: NAD, alert, cooperative with exam HEENT: NCAT CV: RRR, good S1/S2, no murmur Resp: CTABL, no wheezes, non-labored Ext: No edema, warm Neuro: Alert and oriented, No gross deficits  Assessment and plan:  # Depression Uncontrolled, patient with significant anhedonia Continue Wellbutrin plus Lexapro, titrate Lexapro 20 mg once daily. Follow-up in 4 months  # Hypertension Reasonably well-controlled, complex regimen with CHF, no changes Continue Coreg plus entresto plus aldactone  # Systolic CHF Appears euvolemic, asymptomatic currently. Doing very well on current medication regimen, patient is managed by CHF clinic-appreciate their management.  # GERD Refill PPI, daily symptoms without medication. Currently well controlled   Meds ordered this encounter  Medications  . buPROPion (WELLBUTRIN XL) 150 MG 24 hr tablet    Sig: TAKE 1 TABLET (150  MG TOTAL) BY MOUTH 2 (TWO) TIMES DAILY.    Dispense:  180 tablet    Refill:  1  . escitalopram (LEXAPRO) 20 MG tablet    Sig: TAKE 1 TABLET (10 MG TOTAL) BY MOUTH DAILY.    Dispense:  90 tablet    Refill:  1  . ondansetron (ZOFRAN ODT) 8 MG disintegrating tablet    Sig: Take 1 tablet (8 mg total) by mouth every 8 (eight) hours as needed for nausea or vomiting.    Dispense:  20 tablet    Refill:  1  . pantoprazole (PROTONIX) 40 MG tablet    Sig: TAKE 1 TABLET (40 MG TOTAL) BY MOUTH DAILY.    Dispense:  90 tablet    Refill:  3  . rosuvastatin (CRESTOR) 40 MG tablet    Sig: TAKE 1 TABLET (40 MG TOTAL) BY MOUTH DAILY.    Dispense:  90 tablet    Refill:  [place], MD [place] 02/10/2017, 10:50 AM

## 2017-03-06 ENCOUNTER — Encounter: Payer: Self-pay | Admitting: Pharmacist

## 2017-03-27 ENCOUNTER — Other Ambulatory Visit: Payer: Self-pay | Admitting: Family Medicine

## 2017-04-23 ENCOUNTER — Other Ambulatory Visit (HOSPITAL_COMMUNITY): Payer: Self-pay | Admitting: Internal Medicine

## 2017-05-29 ENCOUNTER — Other Ambulatory Visit: Payer: Self-pay | Admitting: Family Medicine

## 2017-06-11 ENCOUNTER — Ambulatory Visit (HOSPITAL_COMMUNITY)
Admission: RE | Admit: 2017-06-11 | Discharge: 2017-06-11 | Disposition: A | Discharge status: Home / Self Care | Payer: Medicare Other | Source: Ambulatory Visit | Attending: Internal Medicine | Admitting: Internal Medicine

## 2017-06-11 ENCOUNTER — Encounter (HOSPITAL_COMMUNITY): Payer: Self-pay | Admitting: Internal Medicine

## 2017-06-11 VITALS — BP 143/53 | HR 65 | Wt 151.5 lb

## 2017-06-11 DIAGNOSIS — Z79899 Other long term (current) drug therapy: Secondary | ICD-10-CM | POA: Diagnosis not present

## 2017-06-11 DIAGNOSIS — I2510 Atherosclerotic heart disease of native coronary artery without angina pectoris: Secondary | ICD-10-CM | POA: Diagnosis not present

## 2017-06-11 DIAGNOSIS — Z951 Presence of aortocoronary bypass graft: Secondary | ICD-10-CM | POA: Diagnosis not present

## 2017-06-11 DIAGNOSIS — I130 Hypertensive heart and chronic kidney disease with heart failure and stage 1 through stage 4 chronic kidney disease, or unspecified chronic kidney disease: Secondary | ICD-10-CM | POA: Diagnosis not present

## 2017-06-11 DIAGNOSIS — E1022 Type 1 diabetes mellitus with diabetic chronic kidney disease: Secondary | ICD-10-CM | POA: Diagnosis not present

## 2017-06-11 DIAGNOSIS — I447 Left bundle-branch block, unspecified: Secondary | ICD-10-CM | POA: Diagnosis not present

## 2017-06-11 DIAGNOSIS — I255 Ischemic cardiomyopathy: Secondary | ICD-10-CM | POA: Diagnosis not present

## 2017-06-11 DIAGNOSIS — E1043 Type 1 diabetes mellitus with diabetic autonomic (poly)neuropathy: Secondary | ICD-10-CM | POA: Diagnosis not present

## 2017-06-11 DIAGNOSIS — M810 Age-related osteoporosis without current pathological fracture: Secondary | ICD-10-CM | POA: Diagnosis not present

## 2017-06-11 DIAGNOSIS — E1010 Type 1 diabetes mellitus with ketoacidosis without coma: Secondary | ICD-10-CM | POA: Insufficient documentation

## 2017-06-11 DIAGNOSIS — I10 Essential (primary) hypertension: Secondary | ICD-10-CM

## 2017-06-11 DIAGNOSIS — E039 Hypothyroidism, unspecified: Secondary | ICD-10-CM | POA: Insufficient documentation

## 2017-06-11 DIAGNOSIS — I5022 Chronic systolic (congestive) heart failure: Secondary | ICD-10-CM | POA: Insufficient documentation

## 2017-06-11 DIAGNOSIS — Z794 Long term (current) use of insulin: Secondary | ICD-10-CM | POA: Diagnosis not present

## 2017-06-11 DIAGNOSIS — F419 Anxiety disorder, unspecified: Secondary | ICD-10-CM | POA: Diagnosis not present

## 2017-06-11 DIAGNOSIS — F329 Major depressive disorder, single episode, unspecified: Secondary | ICD-10-CM | POA: Diagnosis not present

## 2017-06-11 DIAGNOSIS — K3184 Gastroparesis: Secondary | ICD-10-CM | POA: Insufficient documentation

## 2017-06-11 DIAGNOSIS — I214 Non-ST elevation (NSTEMI) myocardial infarction: Secondary | ICD-10-CM | POA: Insufficient documentation

## 2017-06-11 DIAGNOSIS — Z7982 Long term (current) use of aspirin: Secondary | ICD-10-CM | POA: Insufficient documentation

## 2017-06-11 DIAGNOSIS — E785 Hyperlipidemia, unspecified: Secondary | ICD-10-CM | POA: Diagnosis not present

## 2017-06-11 DIAGNOSIS — N189 Chronic kidney disease, unspecified: Secondary | ICD-10-CM | POA: Insufficient documentation

## 2017-06-11 DIAGNOSIS — K219 Gastro-esophageal reflux disease without esophagitis: Secondary | ICD-10-CM | POA: Insufficient documentation

## 2017-06-11 DIAGNOSIS — I252 Old myocardial infarction: Secondary | ICD-10-CM | POA: Insufficient documentation

## 2017-06-11 MED ORDER — FUROSEMIDE 20 MG PO TABS: 20.0000 mg | ORAL_TABLET | Freq: Every day | ORAL | 3 refills | Status: DC | PRN

## 2017-06-11 MED ORDER — POTASSIUM CHLORIDE CRYS ER 20 MEQ PO TBCR: 20.0000 meq | EXTENDED_RELEASE_TABLET | Freq: Every day | ORAL | 3 refills | Status: DC | PRN

## 2017-06-11 NOTE — Patient Instructions (Signed)
Start Potassium 20 meq  (1tab) as needed  Start Furosemide 20 mg (1 tab) as needed  Your physician has requested that you have an echocardiogram. Echocardiography is a painless test that uses sound waves to create images of your heart. It provides your doctor with information about the size and shape of your heart and how well your heart's chambers and valves are working. This procedure takes approximately one hour. There are no restrictions for this procedure.  Your physician recommends that you schedule a follow-up appointment in: 6 months with an echocardiogram

## 2017-06-11 NOTE — Progress Notes (Signed)
[place] VISIT  Patient ID: Bridget Martinez, female   DOB: 25-Feb-1951, 66 y.o.   MRN: [id number] PCP: Dr Laroy Apple ([facility]) Endocrinologist: Dr Buddy Duty  HPI: Bridget Martinez is a 66 yo woman with DM1, HTN, hypothyroidism, CAD s/p CABG x 4 (09/2011), depression and CHF due to ICM with EF 30-35%  Admitted to the hospital 08/24/13 for altered mental status and was found to be severely acidotic and was intubated. Glucose on admission >700, temp 91.8 and BP 72/37. Blood cultures one out of two show coagulase-negative staph, urine cultures showed bacillus species  She has had multiple admissions for gastroparesis and hyperglycemia.  In 2/16, she was admitted with DKA, n/v, gastroparesis, and NSTEMI. Evaluated by Dr Burt Knack and not felt to be cath candidate.  She as noted to have intermittent LBBB.    In 4/16, she fell and was re-admitted with hip fracture (mechanical fall), now s/p surgery.  She went to a rehab facility.    She returns today for regular follow up. At last visit Entresto increased to 97/103. Feels ok. Says breathing feels ok. Able to do ADLs without much problem. Has lot about 6 pounds. Minimal ankle swelling. Not taking any lasix. No dizziness or palpitations. No problems with meds. Blood sugars very labile.    Echo 01/2016 EF 30-35% inferior AK  ECHOs 11/21/11 EF 35-40% 07/30/12  EF  45-50% Severe hypokinesis of inferior posterior wall 09/21/13 EF 35-40% with grade II DD  10/29/2014 EF 30-35% Grade II DD, mild AI, PA systolic pressure 45 mmHg.   Labs 01/2013: K+ 4.0, Creatinine 1.31, BUN 35        08/2013: K+ 4.4, Cr 0.81        11/30/13: K+ 5.4, glucose 541, creatinine 0.91        11/05/2014: K 3.6 Creatinine 1.66           4/16: K 5.9 => 5.1, creatinine 0.9, HCT 30.9         2/17: K 4.3 creatinine 0.95   ROS: All systems negative except as listed in HPI, PMH and Problem List.  Past Medical History:  Diagnosis Date  . Arthritis   . CHF (congestive heart  failure) ([place])   . Coronary artery disease    angina.  MI.   . Depression    anxiety  . Diabetes mellitus    on insulin, with h/o DKA   . GERD (gastroesophageal reflux disease)    Hiatal hernia.   Marland Kitchen HTN (hypertension)   . Hyperlipidemia   . Hypertension   . Hypothyroidism   . Left displaced femoral neck fracture ([place]) 11/19/2014  . Myocardial infarction [facility])    NSTEMI 07/2011 with cardiogenic shock, s/p CABG 09/2011  . Osteoporosis   . Pneumonia       . Tuberculosis   . Venous stasis ulcers ([facility])     Current Outpatient Prescriptions  Medication Sig Dispense Refill  . aspirin 81 MG EC tablet Take 1 tablet (81 mg total) by mouth daily. 30 tablet 3  . buPROPion (WELLBUTRIN XL) 150 MG 24 hr tablet TAKE 1 TABLET (150 MG TOTAL) BY MOUTH 2 (TWO) TIMES DAILY. 180 tablet 1  . carvedilol (COREG) 6.25 MG tablet TAKE 1 TABLET (6.25 MG TOTAL) BY MOUTH 2 (TWO) TIMES DAILY WITH A MEAL. 180 tablet 1  . ENTRESTO 97-103 MG TAKE 1 TABLET BY MOUTH 2 (TWO) TIMES DAILY. 60 tablet 11  . escitalopram (LEXAPRO) 20 MG tablet TAKE 1 TABLET (10 MG  TOTAL) BY MOUTH DAILY. 90 tablet 1  . Insulin Detemir (LEVEMIR FLEXPEN) 100 UNIT/ML Pen Inject 22 Units into the skin 2 (two) times daily.     . insulin lispro (HUMALOG) 100 UNIT/ML injection Inject 1-22 Units into the skin 3 (three) times daily before meals.    Marland Kitchen levothyroxine (SYNTHROID, LEVOTHROID) 125 MCG tablet Take 1 tablet (125 mcg total) by mouth every morning. 90 tablet 1  . metoCLOPramide (REGLAN) 5 MG tablet TAKE 1 TABLET (5 MG TOTAL) BY MOUTH 3 (THREE) TIMES DAILY BEFORE MEALS. 270 tablet 0  . ondansetron (ZOFRAN ODT) 8 MG disintegrating tablet Take 1 tablet (8 mg total) by mouth every 8 (eight) hours as needed for nausea or vomiting. 20 tablet 1  . pantoprazole (PROTONIX) 40 MG tablet TAKE 1 TABLET (40 MG TOTAL) BY MOUTH DAILY. 90 tablet 3  . rosuvastatin (CRESTOR) 40 MG tablet TAKE 1 TABLET (40 MG TOTAL) BY MOUTH DAILY. 90 tablet 3   No current  facility-administered medications for this encounter.     Vitals:   06/11/17 0900  BP: (!) 143/53  Pulse: 65  SpO2: 100%  Weight: 151 lb 8 oz (68.7 kg)   Wt Readings from Last 3 Encounters:  06/11/17 151 lb 8 oz (68.7 kg)  02/10/17 157 lb (71.2 kg)  12/13/16 153 lb 8 oz (69.6 kg)    PHYSICAL EXAM:  General:  Well appearing but pale No resp difficulty HEENT: normal Neck: supple. JVP 6. Carotids 2+ bilat; no bruits. No lymphadenopathy or thryomegaly appreciated. Cor: PMI laterally displaced. Regular rate & rhythm. Very soft AI murmur Lungs: clear Abdomen: soft, nontender, nondistended. No hepatosplenomegaly. No bruits or masses. Good bowel sounds. Extremities: no cyanosis, clubbing, rash, trace edema Neuro: alert & orientedx3, cranial nerves grossly intact. moves all 4 extremities w/o difficulty. Affect pleasant   ASSESSMENT & PLAN:   1) Chronic systolic HF: Ischemic cardiomyopathy.  Echo 01/2016 EF 35% (stable)RV normal.  NYHA II-III symptoms and volume status stable.  - Stable NYHA II.  - Volume status stable off lasix. - Continue Coreg 6.25 mg BID.  - Continue spironolactone 25 mg daily  - Continue Entresto 97/103 mg BID.   - Will give her lasix 20mg  to use as needed and take with kcl 20  - We discussed ICD again and she does not want it.  - Reinforced the need and importance of daily weights, a low sodium diet, and fluid restriction (less than 2 L a day). Instructed to call the HF clinic if weight increases more than 3 lbs overnight or 5 lbs in a week.  - [facility]  2) HTN:  - BP up here but has well controlled at other offices. Will not change.  3) HLD:  - Continue rosuvastatin. Followed by [facility]. Goal LDL < 70.  4) CAD: s/p CABG x 4. No chest pain.  R - No CP.  - Myoview 4/18. No ischemia - Continue  ASA to 81mg  daily and continue statin.   5) DM, type I: - She is followed by Dr Buddy Duty. - With DM1 does not qualify  for Jardiance 6) CKD:  - Followed by PCP   Glori Bickers, MD  9:34 AM

## 2017-06-11 NOTE — Addendum Note (Signed)
Encounter addended by: Shirley Muscat, RN on: 06/11/2017  9:49 AM<BR>    Actions taken: Sign clinical note, Order list changed, Diagnosis association updated

## 2017-06-12 ENCOUNTER — Ambulatory Visit: Payer: Medicare Other | Admitting: Family Medicine

## 2017-06-23 ENCOUNTER — Other Ambulatory Visit: Payer: Self-pay | Admitting: Family Medicine

## 2017-07-01 ENCOUNTER — Ambulatory Visit (INDEPENDENT_AMBULATORY_CARE_PROVIDER_SITE_OTHER): Payer: Medicare Other | Admitting: Family Medicine

## 2017-07-01 ENCOUNTER — Encounter: Payer: Self-pay | Admitting: Family Medicine

## 2017-07-01 VITALS — BP 113/52 | HR 71 | Temp 97.8°F | Ht 62.0 in | Wt 154.0 lb

## 2017-07-01 DIAGNOSIS — E7800 Pure hypercholesterolemia, unspecified: Secondary | ICD-10-CM | POA: Diagnosis not present

## 2017-07-01 DIAGNOSIS — E038 Other specified hypothyroidism: Secondary | ICD-10-CM

## 2017-07-01 DIAGNOSIS — N3001 Acute cystitis with hematuria: Secondary | ICD-10-CM | POA: Diagnosis not present

## 2017-07-01 DIAGNOSIS — F32A Depression, unspecified: Secondary | ICD-10-CM

## 2017-07-01 DIAGNOSIS — F329 Major depressive disorder, single episode, unspecified: Secondary | ICD-10-CM

## 2017-07-01 LAB — URINALYSIS, COMPLETE
Bilirubin, UA: NEGATIVE
GLUCOSE, UA: NEGATIVE
Ketones, UA: NEGATIVE
Nitrite, UA: NEGATIVE
Specific Gravity, UA: 1.025 (ref 1.005–1.030)
UUROB: 0.2 mg/dL (ref 0.2–1.0)
pH, UA: 7 (ref 5.0–7.5)

## 2017-07-01 LAB — MICROSCOPIC EXAMINATION: Renal Epithel, UA: NONE SEEN /hpf

## 2017-07-01 MED ORDER — DOXYCYCLINE HYCLATE 100 MG PO TABS: 100.0000 mg | ORAL_TABLET | Freq: Two times a day (BID) | ORAL | 0 refills | Status: DC

## 2017-07-01 MED ORDER — LEVOTHYROXINE SODIUM 125 MCG PO TABS: 125.0000 ug | ORAL_TABLET | Freq: Every morning | ORAL | 3 refills | Status: DC

## 2017-07-01 NOTE — Progress Notes (Signed)
   HPI  Patient presents today here to follow-up for chronic medical conditions as well as UTI.  UTI Symptoms for "several days". No fever, chills, sweats. Tolerating food and fluids like usual.  Hyperlipidemia Good medication tolerance, no side effects.  Hypothyroidism, depression Needs refill of Synthroid Is felt more down lately, but does not really want to take more depression medications. No SI.  PMH: Smoking status noted ROS: Per HPI  Objective: BP (!) 113/52   Pulse 71   Temp 97.8 F (36.6 C) (Oral)   Ht '5\' 2"'$  (1.575 m)   Wt 154 lb (69.9 kg)   BMI 28.17 kg/m  Gen: NAD, alert, cooperative with exam HEENT: NCAT CV: RRR, good S1/S2, no murmur Resp: CTABL, no wheezes, non-labored Ext: No edema, warm Neuro: Alert and oriented, No gross deficits  Diabetic Foot Exam - Simple   Simple Foot Form Diabetic Foot exam was performed with the following findings:  Yes 07/01/2017 11:28 AM  Visual Inspection See comments:  Yes Sensation Testing Intact to touch and monofilament testing bilaterally:  Yes Pulse Check Posterior Tibialis and Dorsalis pulse intact bilaterally:  Yes Comments Patient with diffuse scaling, thickened yellow toenails throughout.      Assessment and plan:  #UTI with hemorrhagic cystitis Treat with doxycycline given her penicillin and Cipro allergies. No signs of urosepsis.  #Depression Patient with moderately controlled depression, Discussed changing medications, she would like to continue for now.  #Hyperlipidemia Repeat labs today No changes to Crestor.   #Hypothyroidism Repeat labs, refill Synthroid  Onychomycosis Patient I feel is too high risk for Lamisil orally.  Consider podiatry referral    Orders Placed This Encounter  Procedures  . Urine Culture  . Urinalysis, Complete  . CMP14+EGFR  . CBC with Differential/Platelet  . Lipid panel  . TSH    Meds ordered this encounter  Medications  . levothyroxine (SYNTHROID,  LEVOTHROID) 125 MCG tablet    Sig: Take 1 tablet (125 mcg total) by mouth every morning.    Dispense:  90 tablet    Refill:  3  . doxycycline (VIBRA-TABS) 100 MG tablet    Sig: Take 1 tablet (100 mg total) by mouth 2 (two) times daily. 1 po bid    Dispense:  20 tablet    Refill:  0    Laroy Apple, MD [facility] 07/01/2017, 10:42 AM

## 2017-07-01 NOTE — Patient Instructions (Signed)
Great to see you!  I am treating you with doxycyline for a UTI, we will culture it as well to be sure this was effective.    Urinary Tract Infection, Adult A urinary tract infection (UTI) is an infection of any part of the urinary tract, which includes the kidneys, ureters, bladder, and urethra. These organs make, store, and get rid of urine in the body. UTI can be a bladder infection (cystitis) or kidney infection (pyelonephritis). What are the causes? This infection may be caused by fungi, viruses, or bacteria. Bacteria are the most common cause of UTIs. This condition can also be caused by repeated incomplete emptying of the bladder during urination. What increases the risk? This condition is more likely to develop if:  You ignore your need to urinate or hold urine for long periods of time.  You do not empty your bladder completely during urination.  You wipe back to front after urinating or having a bowel movement, if you are female.  You are uncircumcised, if you are female.  You are constipated.  You have a urinary catheter that stays in place (indwelling).  You have a weak defense (immune) system.  You have a medical condition that affects your bowels, kidneys, or bladder.  You have diabetes.  You take antibiotic medicines frequently or for long periods of time, and the antibiotics no longer work well against certain types of infections (antibiotic resistance).  You take medicines that irritate your urinary tract.  You are exposed to chemicals that irritate your urinary tract.  You are female.  What are the signs or symptoms? Symptoms of this condition include:  Fever.  Frequent urination or passing small amounts of urine frequently.  Needing to urinate urgently.  Pain or burning with urination.  Urine that smells bad or unusual.  Cloudy urine.  Pain in the lower abdomen or back.  Trouble urinating.  Blood in the urine.  Vomiting or being less hungry  than normal.  Diarrhea or abdominal pain.  Vaginal discharge, if you are female.  How is this diagnosed? This condition is diagnosed with a medical history and physical exam. You will also need to provide a urine sample to test your urine. Other tests may be done, including:  Blood tests.  Sexually transmitted disease (STD) testing.  If you have had more than one UTI, a cystoscopy or imaging studies may be done to determine the cause of the infections. How is this treated? Treatment for this condition often includes a combination of two or more of the following:  Antibiotic medicine.  Other medicines to treat less common causes of UTI.  Over-the-counter medicines to treat pain.  Drinking enough water to stay hydrated.  Follow these instructions at home:  Take over-the-counter and prescription medicines only as told by your health care provider.  If you were prescribed an antibiotic, take it as told by your health care provider. Do not stop taking the antibiotic even if you start to feel better.  Avoid alcohol, caffeine, tea, and carbonated beverages. They can irritate your bladder.  Drink enough fluid to keep your urine clear or pale yellow.  Keep all follow-up visits as told by your health care provider. This is important.  Make sure to: ? Empty your bladder often and completely. Do not hold urine for long periods of time. ? Empty your bladder before and after sex. ? Wipe from front to back after a bowel movement if you are female. Use each tissue one time when  you wipe. Contact a health care provider if:  You have back pain.  You have a fever.  You feel nauseous or vomit.  Your symptoms do not get better after 3 days.  Your symptoms go away and then return. Get help right away if:  You have severe back pain or lower abdominal pain.  You are vomiting and cannot keep down any medicines or water. This information is not intended to replace advice given to you  by your health care provider. Make sure you discuss any questions you have with your health care provider. Document Released: 06/05/2005 Document Revised: 02/07/2016 Document Reviewed: 07/17/2015 Elsevier Interactive Patient Education  2017 [employer].

## 2017-07-02 LAB — CBC WITH DIFFERENTIAL/PLATELET
BASOS ABS: 0 10*3/uL (ref 0.0–0.2)
Basos: 0 %
EOS (ABSOLUTE): 0.1 10*3/uL (ref 0.0–0.4)
Eos: 2 %
Hematocrit: 32.6 % — ABNORMAL LOW (ref 34.0–46.6)
Hemoglobin: 10.4 g/dL — ABNORMAL LOW (ref 11.1–15.9)
IMMATURE GRANULOCYTES: 0 %
Immature Grans (Abs): 0 10*3/uL (ref 0.0–0.1)
LYMPHS ABS: 1.6 10*3/uL (ref 0.7–3.1)
Lymphs: 22 %
MCH: 27.7 pg (ref 26.6–33.0)
MCHC: 31.9 g/dL (ref 31.5–35.7)
MCV: 87 fL (ref 79–97)
MONOS ABS: 0.4 10*3/uL (ref 0.1–0.9)
Monocytes: 6 %
NEUTROS PCT: 70 %
Neutrophils Absolute: 5 10*3/uL (ref 1.4–7.0)
PLATELETS: 317 10*3/uL (ref 150–379)
RBC: 3.76 x10E6/uL — AB (ref 3.77–5.28)
RDW: 14.8 % (ref 12.3–15.4)
WBC: 7.2 10*3/uL (ref 3.4–10.8)

## 2017-07-02 LAB — CMP14+EGFR
ALK PHOS: 111 IU/L (ref 39–117)
ALT: 15 IU/L (ref 0–32)
AST: 11 IU/L (ref 0–40)
Albumin/Globulin Ratio: 2 (ref 1.2–2.2)
Albumin: 4.3 g/dL (ref 3.6–4.8)
BILIRUBIN TOTAL: 0.4 mg/dL (ref 0.0–1.2)
BUN / CREAT RATIO: 15 (ref 12–28)
BUN: 23 mg/dL (ref 8–27)
CO2: 23 mmol/L (ref 20–29)
CREATININE: 1.52 mg/dL — AB (ref 0.57–1.00)
Calcium: 9 mg/dL (ref 8.7–10.3)
Chloride: 100 mmol/L (ref 96–106)
GFR calc Af Amer: 41 mL/min/{1.73_m2} — ABNORMAL LOW (ref >59)
GFR calc non Af Amer: 35 mL/min/{1.73_m2} — ABNORMAL LOW (ref >59)
GLOBULIN, TOTAL: 2.2 g/dL (ref 1.5–4.5)
Glucose: 89 mg/dL (ref 65–99)
Potassium: 4.6 mmol/L (ref 3.5–5.2)
SODIUM: 136 mmol/L (ref 134–144)
TOTAL PROTEIN: 6.5 g/dL (ref 6.0–8.5)

## 2017-07-02 LAB — LIPID PANEL
CHOL/HDL RATIO: 2 ratio (ref 0.0–4.4)
CHOLESTEROL TOTAL: 131 mg/dL (ref 100–199)
HDL: 67 mg/dL (ref >39)
LDL Calculated: 53 mg/dL (ref 0–99)
TRIGLYCERIDES: 53 mg/dL (ref 0–149)
VLDL Cholesterol Cal: 11 mg/dL (ref 5–40)

## 2017-07-02 LAB — TSH: TSH: 0.722 u[IU]/mL (ref 0.450–4.500)

## 2017-07-07 ENCOUNTER — Telehealth: Payer: Self-pay | Admitting: *Deleted

## 2017-07-07 DIAGNOSIS — N183 Chronic kidney disease, stage 3 unspecified: Secondary | ICD-10-CM

## 2017-07-07 LAB — URINE CULTURE

## 2017-07-07 NOTE — Telephone Encounter (Signed)
she does want the kidney dr referral now

## 2017-07-08 NOTE — Telephone Encounter (Signed)
Referral written.   Laroy Apple, MD [facility] 07/08/2017, 7:45 AM

## 2017-07-08 NOTE — Telephone Encounter (Signed)
Patient informed. 

## 2017-07-18 ENCOUNTER — Other Ambulatory Visit (HOSPITAL_COMMUNITY): Payer: Self-pay | Admitting: Internal Medicine

## 2017-07-28 ENCOUNTER — Telehealth: Payer: Self-pay

## 2017-07-28 NOTE — Telephone Encounter (Signed)
I called to tell patient about coming back in for BMP and she said she still thinks she has a UTI    Thinks she needs more ABX's

## 2017-07-28 NOTE — Telephone Encounter (Signed)
Needs to be seen for UTI if symptoms never improved.  Laroy Apple, MD [facility] 07/28/2017, 2:57 PM

## 2017-07-28 NOTE — Telephone Encounter (Signed)
Pt aware appt made

## 2017-07-29 ENCOUNTER — Ambulatory Visit: Payer: Medicare Other | Admitting: Family

## 2017-07-29 ENCOUNTER — Encounter: Payer: Self-pay | Admitting: Family

## 2017-07-29 VITALS — BP 101/65 | HR 78 | Temp 98.5°F | Ht 62.0 in | Wt 150.8 lb

## 2017-07-29 DIAGNOSIS — R300 Dysuria: Secondary | ICD-10-CM | POA: Diagnosis not present

## 2017-07-29 DIAGNOSIS — R7989 Other specified abnormal findings of blood chemistry: Secondary | ICD-10-CM

## 2017-07-29 DIAGNOSIS — N3001 Acute cystitis with hematuria: Secondary | ICD-10-CM | POA: Diagnosis not present

## 2017-07-29 LAB — URINALYSIS, COMPLETE
Bilirubin, UA: NEGATIVE
LEUKOCYTES UA: NEGATIVE
Nitrite, UA: NEGATIVE
SPEC GRAV UA: 1.01 (ref 1.005–1.030)
Urobilinogen, Ur: 0.2 mg/dL (ref 0.2–1.0)
pH, UA: 5 (ref 5.0–7.5)

## 2017-07-29 LAB — MICROSCOPIC EXAMINATION: Renal Epithel, UA: NONE SEEN /hpf

## 2017-07-29 MED ORDER — CIPROFLOXACIN HCL 250 MG PO TABS: 250.0000 mg | ORAL_TABLET | Freq: Two times a day (BID) | ORAL | 0 refills | Status: DC

## 2017-07-29 NOTE — Progress Notes (Signed)
   Subjective:    Patient ID: Bridget Martinez, female    DOB: [birth year], 66 y.o.   MRN: [id number]  Pt presents to the office today with recurrent UTI symptoms. Pt was treated with doxycyline on 07/01/17 for a positive Urine culture of Klebsiella pneumoniae.  Urinary Tract Infection   This is a recurrent problem. The current episode started 1 to 4 weeks ago. The problem occurs intermittently. The problem has been waxing and waning. The quality of the pain is described as burning. The pain is at a severity of 4/10. There has been no fever. Associated symptoms include flank pain, frequency and urgency. Pertinent negatives include no hematuria, hesitancy, nausea or vomiting. She has tried antibiotics for the symptoms. The treatment provided mild relief.      Review of Systems  Gastrointestinal: Negative for nausea and vomiting.  Genitourinary: Positive for flank pain, frequency and urgency. Negative for hematuria and hesitancy.  All other systems reviewed and are negative.      Objective:   Physical Exam  Constitutional: She is oriented to person, place, and time. She appears well-developed and well-nourished. No distress.  HENT:  Head: Normocephalic.  Eyes: Pupils are equal, round, and reactive to light.  Neck: Normal range of motion. Neck supple. No thyromegaly present.  Cardiovascular: Normal rate, regular rhythm, normal heart sounds and intact distal pulses.  No murmur heard. Pulmonary/Chest: Effort normal and breath sounds normal. No respiratory distress. She has no wheezes.  Abdominal: Soft. Bowel sounds are normal. She exhibits no distension. There is tenderness (mild lower ).  Musculoskeletal: Normal range of motion. She exhibits no edema or tenderness.  Neurological: She is alert and oriented to person, place, and time.  Skin: Skin is warm and dry.  Psychiatric: She has a normal mood and affect. Her behavior is normal. Judgment and thought content normal.  Vitals  reviewed.     BP 101/65   Pulse 78   Temp 98.5 F (36.9 C) (Oral)   Ht '5\' 2"'$  (1.575 m)   Wt 150 lb 12.8 oz (68.4 kg)   BMI 27.58 kg/m      Assessment & Plan:  1. Dysuria - Urinalysis, Complete - BMP8+EGFR  2. Acute cystitis with hematuria Force fluids AZO over the counter X2 days RTO prn Culture pending - BMP8+EGFR - ciprofloxacin (CIPRO) 250 MG tablet; Take 1 tablet (250 mg total) by mouth 2 (two) times daily.  Dispense: 10 tablet; Refill: 0 - Urine Culture  3. Elevated serum creatinine - BMP8+EGFR    Evelina Dun, FNP

## 2017-07-29 NOTE — Patient Instructions (Signed)

## 2017-07-30 ENCOUNTER — Other Ambulatory Visit: Payer: Self-pay | Admitting: Family

## 2017-07-30 DIAGNOSIS — N183 Chronic kidney disease, stage 3 unspecified: Secondary | ICD-10-CM

## 2017-07-30 LAB — BMP8+EGFR
BUN / CREAT RATIO: 21 (ref 12–28)
BUN: 38 mg/dL — ABNORMAL HIGH (ref 8–27)
CALCIUM: 9.4 mg/dL (ref 8.7–10.3)
CHLORIDE: 100 mmol/L (ref 96–106)
CO2: 15 mmol/L — ABNORMAL LOW (ref 20–29)
CREATININE: 1.81 mg/dL — AB (ref 0.57–1.00)
GFR calc Af Amer: 33 mL/min/{1.73_m2} — ABNORMAL LOW (ref >59)
GFR calc non Af Amer: 29 mL/min/{1.73_m2} — ABNORMAL LOW (ref >59)
Glucose: 444 mg/dL (ref 65–99)
Potassium: 4.4 mmol/L (ref 3.5–5.2)
SODIUM: 132 mmol/L — AB (ref 134–144)

## 2017-07-30 LAB — URINE CULTURE

## 2017-08-16 ENCOUNTER — Other Ambulatory Visit: Payer: Self-pay | Admitting: Family Medicine

## 2017-10-02 ENCOUNTER — Ambulatory Visit (INDEPENDENT_AMBULATORY_CARE_PROVIDER_SITE_OTHER): Payer: Medicare Other | Admitting: Ophthalmology

## 2017-10-10 ENCOUNTER — Encounter (INDEPENDENT_AMBULATORY_CARE_PROVIDER_SITE_OTHER): Payer: Medicare Other | Admitting: Ophthalmology

## 2017-10-10 DIAGNOSIS — E113393 Type 2 diabetes mellitus with moderate nonproliferative diabetic retinopathy without macular edema, bilateral: Secondary | ICD-10-CM

## 2017-10-10 DIAGNOSIS — E11319 Type 2 diabetes mellitus with unspecified diabetic retinopathy without macular edema: Secondary | ICD-10-CM

## 2017-10-10 DIAGNOSIS — H35033 Hypertensive retinopathy, bilateral: Secondary | ICD-10-CM

## 2017-10-10 DIAGNOSIS — H43813 Vitreous degeneration, bilateral: Secondary | ICD-10-CM

## 2017-10-10 DIAGNOSIS — I10 Essential (primary) hypertension: Secondary | ICD-10-CM | POA: Diagnosis not present

## 2017-10-14 ENCOUNTER — Other Ambulatory Visit: Payer: Self-pay | Admitting: Family Medicine

## 2017-10-27 ENCOUNTER — Other Ambulatory Visit (HOSPITAL_COMMUNITY): Payer: Self-pay | Admitting: Nephrology

## 2017-10-27 DIAGNOSIS — N183 Chronic kidney disease, stage 3 unspecified: Secondary | ICD-10-CM

## 2017-11-07 ENCOUNTER — Ambulatory Visit (HOSPITAL_COMMUNITY)
Admission: RE | Admit: 2017-11-07 | Discharge: 2017-11-07 | Disposition: A | Discharge status: Home / Self Care | Payer: Medicare Other | Source: Ambulatory Visit | Attending: Nephrology | Admitting: Nephrology

## 2017-11-07 DIAGNOSIS — N183 Chronic kidney disease, stage 3 unspecified: Secondary | ICD-10-CM

## 2017-11-25 ENCOUNTER — Other Ambulatory Visit: Payer: Self-pay | Admitting: Family Medicine

## 2018-01-27 ENCOUNTER — Other Ambulatory Visit: Payer: Self-pay | Admitting: Family Medicine

## 2018-02-02 ENCOUNTER — Other Ambulatory Visit (HOSPITAL_COMMUNITY): Payer: Self-pay | Admitting: Internal Medicine

## 2018-02-03 MED ORDER — CARVEDILOL 6.25 MG PO TABS: 6.2500 mg | ORAL_TABLET | Freq: Two times a day (BID) | ORAL | 1 refills | Status: DC

## 2018-02-03 NOTE — Addendum Note (Signed)
Addended by: Kerry Dory on: 02/03/2018 10:54 AM   Modules accepted: Orders

## 2018-03-03 ENCOUNTER — Other Ambulatory Visit: Payer: Self-pay | Admitting: Family Medicine

## 2018-03-07 ENCOUNTER — Other Ambulatory Visit: Payer: Self-pay | Admitting: Family Medicine

## 2018-03-09 NOTE — Telephone Encounter (Signed)
Last seen 07/29/17  Dr Wendi Snipes

## 2018-03-11 ENCOUNTER — Other Ambulatory Visit: Payer: Self-pay | Admitting: Family Medicine

## 2018-03-27 ENCOUNTER — Emergency Department (HOSPITAL_COMMUNITY)
Admission: EM | Admit: 2018-03-27 | Discharge: 2018-03-28 | Disposition: A | Discharge status: Home / Self Care | Payer: Medicare Other | Attending: Emergency Medicine | Admitting: Emergency Medicine

## 2018-03-27 ENCOUNTER — Encounter (HOSPITAL_COMMUNITY): Payer: Self-pay | Admitting: Emergency Medicine

## 2018-03-27 DIAGNOSIS — E162 Hypoglycemia, unspecified: Secondary | ICD-10-CM | POA: Diagnosis present

## 2018-03-27 DIAGNOSIS — I2510 Atherosclerotic heart disease of native coronary artery without angina pectoris: Secondary | ICD-10-CM | POA: Diagnosis not present

## 2018-03-27 DIAGNOSIS — E1022 Type 1 diabetes mellitus with diabetic chronic kidney disease: Secondary | ICD-10-CM | POA: Diagnosis not present

## 2018-03-27 DIAGNOSIS — N183 Chronic kidney disease, stage 3 (moderate): Secondary | ICD-10-CM | POA: Insufficient documentation

## 2018-03-27 DIAGNOSIS — I130 Hypertensive heart and chronic kidney disease with heart failure and stage 1 through stage 4 chronic kidney disease, or unspecified chronic kidney disease: Secondary | ICD-10-CM | POA: Insufficient documentation

## 2018-03-27 DIAGNOSIS — Z96642 Presence of left artificial hip joint: Secondary | ICD-10-CM | POA: Diagnosis not present

## 2018-03-27 DIAGNOSIS — Z951 Presence of aortocoronary bypass graft: Secondary | ICD-10-CM | POA: Insufficient documentation

## 2018-03-27 DIAGNOSIS — R4182 Altered mental status, unspecified: Secondary | ICD-10-CM | POA: Insufficient documentation

## 2018-03-27 DIAGNOSIS — Z79899 Other long term (current) drug therapy: Secondary | ICD-10-CM | POA: Insufficient documentation

## 2018-03-27 DIAGNOSIS — I5042 Chronic combined systolic (congestive) and diastolic (congestive) heart failure: Secondary | ICD-10-CM | POA: Insufficient documentation

## 2018-03-27 DIAGNOSIS — I252 Old myocardial infarction: Secondary | ICD-10-CM | POA: Insufficient documentation

## 2018-03-27 DIAGNOSIS — Z794 Long term (current) use of insulin: Secondary | ICD-10-CM | POA: Insufficient documentation

## 2018-03-27 DIAGNOSIS — Z7982 Long term (current) use of aspirin: Secondary | ICD-10-CM | POA: Diagnosis not present

## 2018-03-27 LAB — COMPREHENSIVE METABOLIC PANEL
ALK PHOS: 73 U/L (ref 38–126)
ALT: 15 U/L (ref 0–44)
AST: 16 U/L (ref 15–41)
Albumin: 3.4 g/dL — ABNORMAL LOW (ref 3.5–5.0)
Anion gap: 9 (ref 5–15)
BILIRUBIN TOTAL: 0.5 mg/dL (ref 0.3–1.2)
BUN: 31 mg/dL — ABNORMAL HIGH (ref 8–23)
CALCIUM: 8.8 mg/dL — AB (ref 8.9–10.3)
CO2: 18 mmol/L — AB (ref 22–32)
CREATININE: 1.4 mg/dL — AB (ref 0.44–1.00)
Chloride: 108 mmol/L (ref 98–111)
GFR calc Af Amer: 44 mL/min — ABNORMAL LOW (ref >60)
GFR calc non Af Amer: 38 mL/min — ABNORMAL LOW (ref >60)
Glucose, Bld: 299 mg/dL — ABNORMAL HIGH (ref 70–99)
Potassium: 4.1 mmol/L (ref 3.5–5.1)
SODIUM: 135 mmol/L (ref 135–145)
TOTAL PROTEIN: 5.8 g/dL — AB (ref 6.5–8.1)

## 2018-03-27 LAB — URINALYSIS, ROUTINE W REFLEX MICROSCOPIC
Bilirubin Urine: NEGATIVE
Ketones, ur: NEGATIVE mg/dL
Leukocytes, UA: NEGATIVE
NITRITE: NEGATIVE
PH: 5 (ref 5.0–8.0)
Protein, ur: NEGATIVE mg/dL
Specific Gravity, Urine: 1.015 (ref 1.005–1.030)

## 2018-03-27 LAB — CBC
HCT: 32.6 % — ABNORMAL LOW (ref 36.0–46.0)
Hemoglobin: 10.1 g/dL — ABNORMAL LOW (ref 12.0–15.0)
MCH: 27.8 pg (ref 26.0–34.0)
MCHC: 31 g/dL (ref 30.0–36.0)
MCV: 89.8 fL (ref 78.0–100.0)
PLATELETS: 248 10*3/uL (ref 150–400)
RBC: 3.63 MIL/uL — AB (ref 3.87–5.11)
RDW: 14.4 % (ref 11.5–15.5)
WBC: 14.7 10*3/uL — ABNORMAL HIGH (ref 4.0–10.5)

## 2018-03-27 LAB — CBG MONITORING, ED: Glucose-Capillary: 307 mg/dL — ABNORMAL HIGH (ref 70–99)

## 2018-03-27 LAB — LIPASE, BLOOD: Lipase: 32 U/L (ref 11–51)

## 2018-03-27 MED ORDER — METOCLOPRAMIDE HCL 5 MG/ML IJ SOLN
5.0000 mg | Freq: Once | INTRAMUSCULAR | Status: AC
  Administered 2018-03-27: 5 mg via INTRAVENOUS
  Filled 2018-03-27: qty 2

## 2018-03-27 NOTE — Discharge Instructions (Signed)
Your EMS team found to be hypoglycemic which we suspect it was due to your medication use and absence of eating.  Please take your Reglan as normal and follow-up with your primary doctor.  Please resume your glucose management tomorrow and stay hydrated.  Please follow-up with your PCP.  If any symptoms change or worsen, please return to the nearest emergency department.

## 2018-03-27 NOTE — ED Provider Notes (Signed)
[facility] EMERGENCY DEPARTMENT Provider Note   CSN: [id number] Arrival date & time: 03/27/18  2104     History   Chief Complaint Chief Complaint  Patient presents with  . Hypoglycemia    HPI PAOLA ALESHIRE is a 67 y.o. female.  The history is provided by the patient and medical records. No language interpreter was used.  Hypoglycemia  Initial blood sugar:  23 Blood sugar after intervention:  150's Severity:  Severe Onset quality:  Gradual Duration:  1 day Timing:  Constant Progression:  Resolved Chronicity:  Recurrent Diabetic status:  Controlled with insulin Current diabetic therapy:  Levemir and novolog Time since last antidiabetic medication:  12 hours Context: decreased oral intake   Context: not recent illness   Relieved by:  Nothing Ineffective treatments:  None tried Associated symptoms: altered mental status (resolved ona rrival) and decreased responsiveness   Associated symptoms: no shortness of breath, no sweats and no vomiting     Past Medical History:  Diagnosis Date  . Arthritis   . CHF (congestive heart failure) ([place])   . Coronary artery disease    angina.  MI.   . Depression    anxiety  . Diabetes mellitus    on insulin, with h/o DKA   . GERD (gastroesophageal reflux disease)    Hiatal hernia.   Marland Kitchen HTN (hypertension)   . Hyperlipidemia   . Hypertension   . Hypothyroidism   . Left displaced femoral neck fracture ([place]) 11/19/2014  . Myocardial infarction [facility])    NSTEMI 07/2011 with cardiogenic shock, s/p CABG 09/2011  . Osteoporosis   . Pneumonia       . Tuberculosis   . Venous stasis ulcers [facility])     Patient Active Problem List   Diagnosis Date Noted  . GERD (gastroesophageal reflux disease) 02/10/2017  . Pressure ulcer 02/03/2015  . Fall   . Anemia 11/02/2014  . NSTEMI (non-ST elevated myocardial infarction) ([place])   . Diabetic gastroparesis associated with type 1 diabetes mellitus ([place])   . Protein-calorie  malnutrition, severe ([place]) 10/27/2014  . Hyperkalemia 10/26/2014  . Chronic combined systolic and diastolic congestive heart failure ([place]) 09/19/2014  . Hypothyroidism 09/19/2014  . Hypokalemia 11/27/2013  . DM (diabetes mellitus), type 1, uncontrolled w/neurologic complication ([place]) 89/16/9450  . UTI (urinary tract infection) 11/11/2013  . CKD (chronic kidney disease), stage III ([place]) 11/11/2013  . Myocardial infarction ([place])   . Hypertension   . Heart murmur   . Chronic systolic congestive heart failure, NYHA class 3 ([place]) 08/25/2011  . CAD, multiple vessel 07/27/2011  . Depression 07/25/2011  . Hyperlipidemia   . Venous stasis ulcers (H[facility])     Past Surgical History:  Procedure Laterality Date  . CARDIAC CATHETERIZATION    . COLONOSCOPY N/A 11/18/2013   Procedure: COLONOSCOPY;  Surgeon: Lafayette Dragon, MD;  Location: [facility] ENDOSCOPY;  Service: Endoscopy;  Laterality: N/A;  . CORONARY ARTERY BYPASS GRAFT     Dr. Prescott Gum in 09/2011   . CORONARY ARTERY BYPASS GRAFT  2012  . ESOPHAGOGASTRODUODENOSCOPY N/A 11/17/2013   Procedure: ESOPHAGOGASTRODUODENOSCOPY (EGD);  Surgeon: Lafayette Dragon, MD;  Location: [facility] ENDOSCOPY;  Service: Endoscopy;  Laterality: N/A;  . LEFT HEART CATHETERIZATION WITH CORONARY ANGIOGRAM N/A 07/26/2011   Procedure: LEFT HEART CATHETERIZATION WITH CORONARY ANGIOGRAM;  Surgeon: Larey Dresser, MD;  Location: [facility] CATH LAB;  Service: Cardiovascular;  Laterality: N/A;  . RIGHT HEART CATHETERIZATION N/A 07/29/2011   Procedure: RIGHT HEART CATH;  Surgeon:  Minus Breeding, MD;  Location: [facility] CATH LAB;  Service: Cardiovascular;  Laterality: N/A;  . TONSILLECTOMY    . TOTAL HIP ARTHROPLASTY Left 11/21/2014   Procedure: TOTAL HIP ARTHROPLASTY ANTERIOR APPROACH;  Surgeon: Mcarthur Rossetti, MD;  Location: [place];  Service: Orthopedics;  Laterality: Left;     OB History   None      Home Medications    Prior to Admission medications   Medication Sig Start Date End Date  Taking? Authorizing Provider  aspirin 81 MG EC tablet Take 1 tablet (81 mg total) by mouth daily. 12/13/16  Yes [name], Shaune Pascal, MD  buPROPion (WELLBUTRIN XL) 150 MG 24 hr tablet TAKE 1 TABLET BY MOUTH TWICE A DAY 03/05/18  Yes Timmothy Euler, MD  carvedilol (COREG) 6.25 MG tablet Take 1 tablet (6.25 mg total) by mouth 2 (two) times daily with a meal. Call for office visit [phone] 02/03/18  Yes [name], Shaune Pascal, MD  ENTRESTO 97-103 MG TAKE 1 TABLET BY MOUTH 2 (TWO) TIMES DAILY. 04/23/17  Yes [name], Shaune Pascal, MD  escitalopram (LEXAPRO) 20 MG tablet TAKE 1 TABLET BY MOUTH EVERY DAY 03/05/18  Yes Timmothy Euler, MD  furosemide (LASIX) 20 MG tablet Take 1 tablet (20 mg total) by mouth daily as needed. Patient taking differently: Take 20 mg by mouth daily as needed for fluid or edema.  06/11/17 03/27/18 Yes [name], Shaune Pascal, MD  Insulin Detemir (LEVEMIR FLEXPEN) 100 UNIT/ML Pen Inject 24 Units into the skin at bedtime.    Yes [provider]  insulin lispro (HUMALOG) 100 UNIT/ML injection Inject 1-22 Units into the skin 3 (three) times daily before meals.   Yes [provider]  levothyroxine (SYNTHROID, LEVOTHROID) 125 MCG tablet TAKE 1 TABLET (125 MCG TOTAL) BY MOUTH EVERY MORNING. 03/11/18  Yes Timmothy Euler, MD  pantoprazole (PROTONIX) 40 MG tablet TAKE 1 TABLET BY MOUTH EVERY DAY 03/09/18  Yes Timmothy Euler, MD  potassium chloride SA (KLOR-CON M20) 20 MEQ tablet Take 1 tablet (20 mEq total) by mouth daily as needed. Patient taking differently: Take 20 mEq by mouth daily as needed (with lasix).  06/11/17 03/27/18 Yes [name], Shaune Pascal, MD  rosuvastatin (CRESTOR) 40 MG tablet TAKE 1 TABLET BY MOUTH EVERY DAY 03/05/18  Yes Timmothy Euler, MD  ciprofloxacin (CIPRO) 250 MG tablet Take 1 tablet (250 mg total) by mouth 2 (two) times daily. Patient not taking: Reported on 03/27/2018 07/29/17   Evelina Dun A, FNP  metoCLOPramide (REGLAN) 5 MG tablet TAKE 1  TABLET (5 MG TOTAL) BY MOUTH 3 (THREE) TIMES DAILY BEFORE MEALS. Patient not taking: Reported on 03/27/2018 01/27/18   Timmothy Euler, MD  ondansetron (ZOFRAN ODT) 8 MG disintegrating tablet Take 1 tablet (8 mg total) by mouth every 8 (eight) hours as needed for nausea or vomiting. Patient not taking: Reported on 07/29/2017 02/10/17   Timmothy Euler, MD    Family History Family History  Problem Relation Age of Onset  . Heart disease Father   . Multiple sclerosis Father   . Hypertension Mother   . Hyperthyroidism Mother   . Heart attack Paternal Grandfather   . Heart disease Paternal Grandfather   . Diabetes Cousin        Multiple maternal cousins with type 2 diabetes mellitus  . Diabetes Maternal Uncle        Type 1 diabetes mellitus    Social History Social History   Tobacco Use  . Smoking status:  Never Smoker  . Smokeless tobacco: Never Used  Substance Use Topics  . Alcohol use: No  . Drug use: No     Allergies   Penicillins; Sulfa antibiotics; Macrobid [nitrofurantoin monohyd macro]; and Ciprofloxacin hcl   Review of Systems Review of Systems  Constitutional: Positive for decreased responsiveness. Negative for chills, diaphoresis, fatigue and fever.  HENT: Negative for congestion.   Eyes: Negative for visual disturbance.  Respiratory: Negative for chest tightness, shortness of breath and wheezing.   Cardiovascular: Negative for chest pain and palpitations.  Gastrointestinal: Negative for abdominal pain, constipation, diarrhea, nausea and vomiting.  Genitourinary: Negative for dysuria.  Musculoskeletal: Negative for back pain, neck pain and neck stiffness.  Skin: Negative for rash and wound.  Neurological: Negative for light-headedness and headaches.  Psychiatric/Behavioral: Negative for agitation.  All other systems reviewed and are negative.    Physical Exam Updated Vital Signs BP 129/72 (BP Location: Right Arm)   Pulse 63   Temp 98.5 F (36.9 C)  (Oral)   Resp 15   Ht 5\' 4"  (1.626 m)   Wt 63.5 kg (140 lb)   SpO2 100%   BMI 24.03 kg/m   Physical Exam  Constitutional: She is oriented to person, place, and time. She appears well-developed and well-nourished. No distress.  HENT:  Head: Normocephalic and atraumatic.  Nose: Nose normal.  Mouth/Throat: Oropharynx is clear and moist. No oropharyngeal exudate.  Eyes: Pupils are equal, round, and reactive to light. Conjunctivae and EOM are normal.  Neck: Normal range of motion. Neck supple.  Cardiovascular: Normal rate and regular rhythm.  No murmur heard. Pulmonary/Chest: Effort normal and breath sounds normal. No respiratory distress. She has no wheezes. She has no rales. She exhibits no tenderness.  Abdominal: Soft. There is no tenderness.  Musculoskeletal: She exhibits no edema.  Neurological: She is alert and oriented to person, place, and time. She displays normal reflexes. No cranial nerve deficit or sensory deficit. She exhibits normal muscle tone. Coordination normal.  Skin: Skin is warm and dry. Capillary refill takes less than 2 seconds. No rash noted. She is not diaphoretic. No erythema.  Psychiatric: She has a normal mood and affect.  Nursing note and vitals reviewed.    ED Treatments / Results  Labs (all labs ordered are listed, but only abnormal results are displayed) Labs Reviewed  COMPREHENSIVE METABOLIC PANEL - Abnormal; Notable for the following components:      Result Value   CO2 18 (*)    Glucose, Bld 299 (*)    BUN 31 (*)    Creatinine, Ser 1.40 (*)    Calcium 8.8 (*)    Total Protein 5.8 (*)    Albumin 3.4 (*)    GFR calc non Af Amer 38 (*)    GFR calc Af Amer 44 (*)    All other components within normal limits  CBC - Abnormal; Notable for the following components:   WBC 14.7 (*)    RBC 3.63 (*)    Hemoglobin 10.1 (*)    HCT 32.6 (*)    All other components within normal limits  URINALYSIS, ROUTINE W REFLEX MICROSCOPIC - Abnormal; Notable for the  following components:   Glucose, UA >=500 (*)    Hgb urine dipstick MODERATE (*)    Bacteria, UA MANY (*)    All other components within normal limits  CBG MONITORING, ED - Abnormal; Notable for the following components:   Glucose-Capillary 307 (*)    All other components within normal  limits  LIPASE, BLOOD    EKG None  Radiology No results found.  Procedures Procedures (including critical care time)  Medications Ordered in ED Medications  metoCLOPramide (REGLAN) injection 5 mg (5 mg Intravenous Given 03/27/18 2239)     Initial Impression / Assessment and Plan / ED Course  I have reviewed the triage vital signs and the nursing notes.  Pertinent labs & imaging results that were available during my care of the patient were reviewed by me and considered in my medical decision making (see chart for details).     Bridget Martinez is a 67 y.o. female with past medical history significant for insulin-dependent diabetes, hypothyroidism, hyperlipidemia, CAD, CHF, CKD, and recurrent urinary tract infections who presents with altered mental status and hypoglycemia.  Patient reports that for the last several days she has not had any more of her home or Reglan which she chronically takes for nausea, and abdominal cramping.  She says that with the absence of her medicine she was not feeling well today and was nauseous.  She did not eat breakfast after taking her Levemir and NovoLog today.  She was then acting abnormal and was somnolent with her family prompting EMS evaluation.  Patient was found to have a glucose in the 20s on their arrival.  Patient was given IM glucagon and a vinegar based [employer] sandwich.  Patient's glucose then improved and her symptoms resolved.  Patient reports that she is feeling normal aside from some persistent nausea.  She reports her abdominal cramping but that has resolved.  She denies any urinary symptoms, conservation, diarrhea, emesis, or  other complaints.  Next  On exam, patient had no focal neurologic deficits.  Abdomen was completely nontender on my exam.  Lungs were clear and chest was nontender.  Patient had screening laboratory testing showing glucose had improved.  Patient was allowed to eat in the emergency department and her glucose remained elevated.  She was not hypoglycemic again.  Urinalysis showed glucose and some blood in her urine which she will follow-up with her PCP about.  No nitrites or leukocytes concerning for infection.  Creatinine improved from prior.    Patient has a leukocytosis of 14 however she is not reporting any cough, congestion, or urinary symptoms concerning for infection.  Lipase not elevated.  Vital signs remained reassuring and she continues to feel normal.  Patient hyperglycemia was felt to be secondary to the's insulin use with no eating today.  As this has been corrected, patient will be discharged home.  Patient given prescription for home Reglan and will resume her glucose management tomorrow.  Patient was understanding the plan of care and return precautions understood.  Patient discharged in good condition.    Final Clinical Impressions(s) / ED Diagnoses   Final diagnoses:  Hypoglycemia  Altered mental status, unspecified altered mental status type    ED Discharge Orders        Ordered    metoCLOPramide (REGLAN) 5 MG tablet  Every 8 hours PRN     03/28/18 0000      Clinical Impression: 1. Hypoglycemia   2. Altered mental status, unspecified altered mental status type     Disposition: Discharge  Condition: Good  I have discussed the results, Dx and Tx plan with the pt(& family if present). He/she/they expressed understanding and agree(s) with the plan. Discharge instructions discussed at great length. Strict return precautions discussed and pt &/or family have verbalized understanding of the instructions. No further  questions at time of discharge.    New Prescriptions    METOCLOPRAMIDE (REGLAN) 5 MG TABLET    Take 1 tablet (5 mg total) by mouth every 8 (eight) hours as needed for nausea.    Follow Up: Timmothy Euler, MD [place] [address] [phone]     [facility] EMERGENCY DEPARTMENT [address] [id number] mc [place]       Jesusa Stenerson, Gwenyth Allegra, MD 03/28/18 [phone]

## 2018-03-27 NOTE — ED Triage Notes (Signed)
Pt reports abdominal pain for the last several days, reports she has been out of some of her medications. EMS brought patient in for hypoglycemia, reports initial finger stick glucose 23. Pt received im glucagon and half a BBQ sandwich and sugar up to 147. Pt reports nausea, received 4mg  zofran po.

## 2018-03-28 LAB — CBG MONITORING, ED: Glucose-Capillary: 254 mg/dL — ABNORMAL HIGH (ref 70–99)

## 2018-03-28 MED ORDER — METOCLOPRAMIDE HCL 5 MG PO TABS: 5.0000 mg | ORAL_TABLET | Freq: Three times a day (TID) | ORAL | 0 refills | Status: DC | PRN

## 2018-04-14 ENCOUNTER — Telehealth: Payer: Self-pay | Admitting: Family Medicine

## 2018-04-14 NOTE — Telephone Encounter (Signed)
A1C not done at this office.  BP faxed

## 2018-04-24 ENCOUNTER — Other Ambulatory Visit (HOSPITAL_COMMUNITY): Payer: Self-pay | Admitting: Internal Medicine

## 2018-05-26 ENCOUNTER — Encounter (HOSPITAL_COMMUNITY): Payer: Self-pay | Admitting: Internal Medicine

## 2018-05-26 ENCOUNTER — Ambulatory Visit (HOSPITAL_BASED_OUTPATIENT_CLINIC_OR_DEPARTMENT_OTHER)
Admission: RE | Admit: 2018-05-26 | Discharge: 2018-05-26 | Disposition: A | Discharge status: Home / Self Care | Payer: Medicare Other | Source: Ambulatory Visit | Attending: Internal Medicine | Admitting: Internal Medicine

## 2018-05-26 ENCOUNTER — Ambulatory Visit (HOSPITAL_COMMUNITY)
Admission: RE | Admit: 2018-05-26 | Discharge: 2018-05-26 | Disposition: A | Discharge status: Home / Self Care | Payer: Medicare Other | Source: Ambulatory Visit | Attending: Internal Medicine | Admitting: Internal Medicine

## 2018-05-26 VITALS — BP 142/68 | HR 75 | Wt 151.2 lb

## 2018-05-26 DIAGNOSIS — E039 Hypothyroidism, unspecified: Secondary | ICD-10-CM | POA: Diagnosis not present

## 2018-05-26 DIAGNOSIS — I252 Old myocardial infarction: Secondary | ICD-10-CM | POA: Insufficient documentation

## 2018-05-26 DIAGNOSIS — I2510 Atherosclerotic heart disease of native coronary artery without angina pectoris: Secondary | ICD-10-CM | POA: Insufficient documentation

## 2018-05-26 DIAGNOSIS — I080 Rheumatic disorders of both mitral and aortic valves: Secondary | ICD-10-CM | POA: Diagnosis not present

## 2018-05-26 DIAGNOSIS — E1043 Type 1 diabetes mellitus with diabetic autonomic (poly)neuropathy: Secondary | ICD-10-CM | POA: Insufficient documentation

## 2018-05-26 DIAGNOSIS — E785 Hyperlipidemia, unspecified: Secondary | ICD-10-CM | POA: Insufficient documentation

## 2018-05-26 DIAGNOSIS — I5022 Chronic systolic (congestive) heart failure: Secondary | ICD-10-CM | POA: Diagnosis not present

## 2018-05-26 DIAGNOSIS — Z951 Presence of aortocoronary bypass graft: Secondary | ICD-10-CM | POA: Insufficient documentation

## 2018-05-26 DIAGNOSIS — Z7982 Long term (current) use of aspirin: Secondary | ICD-10-CM | POA: Insufficient documentation

## 2018-05-26 DIAGNOSIS — I340 Nonrheumatic mitral (valve) insufficiency: Secondary | ICD-10-CM | POA: Diagnosis not present

## 2018-05-26 DIAGNOSIS — I130 Hypertensive heart and chronic kidney disease with heart failure and stage 1 through stage 4 chronic kidney disease, or unspecified chronic kidney disease: Secondary | ICD-10-CM | POA: Diagnosis not present

## 2018-05-26 DIAGNOSIS — N183 Chronic kidney disease, stage 3 unspecified: Secondary | ICD-10-CM

## 2018-05-26 DIAGNOSIS — K219 Gastro-esophageal reflux disease without esophagitis: Secondary | ICD-10-CM | POA: Insufficient documentation

## 2018-05-26 DIAGNOSIS — Z79890 Hormone replacement therapy (postmenopausal): Secondary | ICD-10-CM | POA: Diagnosis not present

## 2018-05-26 DIAGNOSIS — N189 Chronic kidney disease, unspecified: Secondary | ICD-10-CM | POA: Diagnosis not present

## 2018-05-26 DIAGNOSIS — I255 Ischemic cardiomyopathy: Secondary | ICD-10-CM | POA: Diagnosis not present

## 2018-05-26 DIAGNOSIS — E1022 Type 1 diabetes mellitus with diabetic chronic kidney disease: Secondary | ICD-10-CM | POA: Insufficient documentation

## 2018-05-26 DIAGNOSIS — Z794 Long term (current) use of insulin: Secondary | ICD-10-CM | POA: Insufficient documentation

## 2018-05-26 DIAGNOSIS — F419 Anxiety disorder, unspecified: Secondary | ICD-10-CM | POA: Diagnosis not present

## 2018-05-26 DIAGNOSIS — F329 Major depressive disorder, single episode, unspecified: Secondary | ICD-10-CM | POA: Diagnosis not present

## 2018-05-26 NOTE — Patient Instructions (Signed)
We will contact you in 1 year to schedule your next appointment and echocardiogram  

## 2018-05-26 NOTE — Addendum Note (Signed)
Encounter addended by: Scarlette Calico, RN on: 05/26/2018 3:59 PM  Actions taken: Order list changed, Diagnosis association updated

## 2018-05-26 NOTE — Progress Notes (Signed)
Bridget Martinez VISIT  Patient ID: Bridget Martinez, female   DOB: 02-04-1951, 67 y.o.   MRN: [id number] PCP: Dr Laroy Apple ([facility]) Endocrinologist: Dr Buddy Duty  HPI: Bridget Martinez is a 67 yo woman with DM1, HTN, hypothyroidism, CAD s/p CABG x 4 (09/2011), depression and CHF due to ICM with EF 30-35%  Admitted to the hospital 08/24/13 for altered mental status and was found to be severely acidotic and was intubated. Glucose on admission >700, temp 91.8 and BP 72/37. Blood cultures one out of two show coagulase-negative staph, urine cultures showed bacillus species  She has had multiple admissions for gastroparesis and hyperglycemia.  In 2/16, she was admitted with DKA, n/v, gastroparesis, and NSTEMI. Evaluated by Dr Burt Knack and not felt to be cath candidate.  She as noted to have intermittent LBBB.    In 4/16, she fell and was re-admitted with hip fracture (mechanical fall), now s/p surgery.  She went to a rehab facility.    She returns today for HF follow up. Last visit, lasix was added back PRN.  She returns today for regular follow up. Says she feels ok. No CP or SOB. Blood sugars continue to be very labile. HgBA1c remains at 10. No edema, orthopnea or PND. Follows with Dr. Buddy Duty who is adjusting her insulin.   At last visit Entresto increased to 97/103. Feels ok. Says breathing feels ok. Able to do ADLs without much problem. Has lot about 6 pounds. Minimal ankle swelling. Not taking any lasix. No dizziness or palpitations. No problems with meds. Blood sugars very labile. Says SBP usually 110-120 range.   Echo 05/26/18: EF 30-35% inferior AK mod MR grade II DD Personally reviewed  Echo 01/2016 EF 30-35% inferior AK  ECHOs 11/21/11 EF 35-40% 07/30/12  EF  45-50% Severe hypokinesis of inferior posterior wall 09/21/13 EF 35-40% with grade II DD  10/29/2014 EF 30-35% Grade II DD, mild AI, PA systolic pressure 45 mmHg.   Labs 01/2013: K+ 4.0, Creatinine 1.31, BUN 35        08/2013: K+  4.4, Cr 0.81        11/30/13: K+ 5.4, glucose 541, creatinine 0.91        11/05/2014: K 3.6 Creatinine 1.66           4/16: K 5.9 => 5.1, creatinine 0.9, HCT 30.9         2/17: K 4.3 creatinine 0.95   Review of systems complete and found to be negative unless listed in HPI.   Past Medical History:  Diagnosis Date  . Arthritis   . CHF (congestive heart failure) ([place])   . Coronary artery disease    angina.  MI.   . Depression    anxiety  . Diabetes mellitus    on insulin, with h/o DKA   . GERD (gastroesophageal reflux disease)    Hiatal hernia.   Marland Kitchen HTN (hypertension)   . Hyperlipidemia   . Hypertension   . Hypothyroidism   . Left displaced femoral neck fracture ([place]) 11/19/2014  . Myocardial infarction [facility])    NSTEMI 07/2011 with cardiogenic shock, s/p CABG 09/2011  . Osteoporosis   . Pneumonia       . Tuberculosis   . Venous stasis ulcers ([facility])     Current Outpatient Medications  Medication Sig Dispense Refill  . aspirin 81 MG EC tablet Take 1 tablet (81 mg total) by mouth daily. 30 tablet 3  . buPROPion (WELLBUTRIN XL) 150 MG 24 hr tablet  TAKE 1 TABLET BY MOUTH TWICE A DAY 180 tablet 1  . carvedilol (COREG) 6.25 MG tablet Take 1 tablet (6.25 mg total) by mouth 2 (two) times daily with a meal. Call for office visit [phone] 180 tablet 1  . ciprofloxacin (CIPRO) 250 MG tablet Take 1 tablet (250 mg total) by mouth 2 (two) times daily. (Patient not taking: Reported on 03/27/2018) 10 tablet 0  . ENTRESTO 97-103 MG TAKE 1 TABLET BY MOUTH 2 (TWO) TIMES DAILY. 60 tablet 1  . escitalopram (LEXAPRO) 20 MG tablet TAKE 1 TABLET BY MOUTH EVERY DAY 90 tablet 1  . furosemide (LASIX) 20 MG tablet Take 1 tablet (20 mg total) by mouth daily as needed. (Patient taking differently: Take 20 mg by mouth daily as needed for fluid or edema. ) 30 tablet 3  . Insulin Detemir (LEVEMIR FLEXPEN) 100 UNIT/ML Pen Inject 24 Units into the skin at bedtime.     . insulin lispro (HUMALOG) 100 UNIT/ML  injection Inject 1-22 Units into the skin 3 (three) times daily before meals.    Marland Kitchen levothyroxine (SYNTHROID, LEVOTHROID) 125 MCG tablet TAKE 1 TABLET (125 MCG TOTAL) BY MOUTH EVERY MORNING. 90 tablet 0  . metoCLOPramide (REGLAN) 5 MG tablet TAKE 1 TABLET (5 MG TOTAL) BY MOUTH 3 (THREE) TIMES DAILY BEFORE MEALS. (Patient not taking: Reported on 03/27/2018) 270 tablet 0  . metoCLOPramide (REGLAN) 5 MG tablet Take 1 tablet (5 mg total) by mouth every 8 (eight) hours as needed for nausea. 30 tablet 0  . ondansetron (ZOFRAN ODT) 8 MG disintegrating tablet Take 1 tablet (8 mg total) by mouth every 8 (eight) hours as needed for nausea or vomiting. (Patient not taking: Reported on 07/29/2017) 20 tablet 1  . pantoprazole (PROTONIX) 40 MG tablet TAKE 1 TABLET BY MOUTH EVERY DAY 90 tablet 1  . potassium chloride SA (KLOR-CON M20) 20 MEQ tablet Take 1 tablet (20 mEq total) by mouth daily as needed. (Patient taking differently: Take 20 mEq by mouth daily as needed (with lasix). ) 30 tablet 3  . rosuvastatin (CRESTOR) 40 MG tablet TAKE 1 TABLET BY MOUTH EVERY DAY 90 tablet 1   No current facility-administered medications for this encounter.     Vitals:   05/26/18 1514  BP: (!) 142/68  Pulse: 75  SpO2: 99%  Weight: 68.6 kg (151 lb 3.2 oz)   Wt Readings from Last 3 Encounters:  03/27/18 63.5 kg (140 lb)  07/29/17 68.4 kg (150 lb 12.8 oz)  07/01/17 69.9 kg (154 lb)    PHYSICAL EXAM:  General:  Well appearing. No resp difficulty HEENT: normal Neck: supple. no JVD. Carotids 2+ bilat; no bruits. No lymphadenopathy or thryomegaly appreciated. Cor: PMI nondisplaced. Regular rate & rhythm. No rubs, gallops or murmurs. Lungs: clear Abdomen: soft, nontender, nondistended. No hepatosplenomegaly. No bruits or masses. Good bowel sounds. Extremities: no cyanosis, clubbing, rash, edema Neuro: alert & orientedx3, cranial nerves grossly intact. moves all 4 extremities w/o difficulty. Affect pleasan    ASSESSMENT  & PLAN:   1) Chronic systolic HF: Ischemic cardiomyopathy.  Echo 01/2016 EF 35% (stable)RV normal.  NYHA II-III symptoms and volume status stable.  - Stable NYHA II  - Volume status looks good.  - Continue lasix 20 mg PRN + 20 meq KCl - Continue Coreg 6.25 mg BID.  - Continue spironolactone 25 mg daily  - Continue Entresto 97/103 mg BID.   - We discussed ICD again and she is not interested - Reinforced the need and importance  of daily weights, a low sodium diet, and fluid restriction (less than 2 L a day). Instructed to call the HF clinic if weight increases more than 3 lbs overnight or 5 lbs in a week.  2) HTN:  - BP up here but has well controlled at other offices. No change.  3) HLD:  - Continue rosuvastatin. Followed by [facility]. Goal LDL < 70. No change.  4) CAD: s/p CABG x 4. No chest pain.  R - No s/s ischemia - Myoview 4/18. No ischemia - Continue ASA to 81mg  daily and continue statin.   5) DM, type I: - She is followed by Dr Buddy Duty. No change.  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

## 2018-05-26 NOTE — Progress Notes (Signed)
  Echocardiogram 2D Echocardiogram has been performed.  Merrie Roof F 05/26/2018, 3:16 PM

## 2018-06-03 ENCOUNTER — Other Ambulatory Visit (HOSPITAL_COMMUNITY): Payer: Self-pay | Admitting: Internal Medicine

## 2018-08-10 ENCOUNTER — Other Ambulatory Visit: Payer: Self-pay | Admitting: *Deleted

## 2018-08-10 NOTE — Telephone Encounter (Signed)
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

## 2018-08-10 NOTE — Telephone Encounter (Signed)
Pt aware and scheduled with Monia Pouch 08/14/18 at 8:20.

## 2018-08-14 ENCOUNTER — Ambulatory Visit: Payer: Medicare Other | Admitting: Family Medicine

## 2018-08-14 ENCOUNTER — Encounter: Payer: Self-pay | Admitting: Family Medicine

## 2018-08-14 VITALS — BP 134/58 | HR 70 | Temp 97.8°F | Ht 64.0 in | Wt 153.0 lb

## 2018-08-14 DIAGNOSIS — N183 Chronic kidney disease, stage 3 unspecified: Secondary | ICD-10-CM

## 2018-08-14 DIAGNOSIS — I5022 Chronic systolic (congestive) heart failure: Secondary | ICD-10-CM

## 2018-08-14 DIAGNOSIS — E1043 Type 1 diabetes mellitus with diabetic autonomic (poly)neuropathy: Secondary | ICD-10-CM

## 2018-08-14 DIAGNOSIS — K219 Gastro-esophageal reflux disease without esophagitis: Secondary | ICD-10-CM

## 2018-08-14 DIAGNOSIS — I10 Essential (primary) hypertension: Secondary | ICD-10-CM

## 2018-08-14 DIAGNOSIS — E1059 Type 1 diabetes mellitus with other circulatory complications: Secondary | ICD-10-CM

## 2018-08-14 DIAGNOSIS — K3184 Gastroparesis: Secondary | ICD-10-CM

## 2018-08-14 DIAGNOSIS — E039 Hypothyroidism, unspecified: Secondary | ICD-10-CM

## 2018-08-14 DIAGNOSIS — L84 Corns and callosities: Secondary | ICD-10-CM

## 2018-08-14 DIAGNOSIS — E1069 Type 1 diabetes mellitus with other specified complication: Secondary | ICD-10-CM

## 2018-08-14 DIAGNOSIS — Z23 Encounter for immunization: Secondary | ICD-10-CM | POA: Diagnosis not present

## 2018-08-14 DIAGNOSIS — B351 Tinea unguium: Secondary | ICD-10-CM

## 2018-08-14 DIAGNOSIS — F321 Major depressive disorder, single episode, moderate: Secondary | ICD-10-CM

## 2018-08-14 DIAGNOSIS — E785 Hyperlipidemia, unspecified: Secondary | ICD-10-CM

## 2018-08-14 MED ORDER — ESCITALOPRAM OXALATE 20 MG PO TABS: 30.0000 mg | ORAL_TABLET | Freq: Every day | ORAL | 3 refills | Status: DC

## 2018-08-14 MED ORDER — ROSUVASTATIN CALCIUM 40 MG PO TABS: ORAL_TABLET | ORAL | 3 refills | Status: DC

## 2018-08-14 MED ORDER — PANTOPRAZOLE SODIUM 40 MG PO TBEC: DELAYED_RELEASE_TABLET | ORAL | 3 refills | Status: DC

## 2018-08-14 MED ORDER — BUPROPION HCL ER (XL) 150 MG PO TB24: 150.0000 mg | ORAL_TABLET | Freq: Two times a day (BID) | ORAL | 3 refills | Status: DC

## 2018-08-14 MED ORDER — METOCLOPRAMIDE HCL 5 MG PO TABS: ORAL_TABLET | ORAL | 3 refills | Status: DC

## 2018-08-14 NOTE — Patient Instructions (Signed)

## 2018-08-14 NOTE — Progress Notes (Signed)
Subjective:    Patient ID: Bridget Martinez, female    DOB: 1950/09/15, 67 y.o.   MRN: [id number]  Chief Complaint:  Medical Management of Chronic Issues   HPI: Bridget Martinez is a 67 y.o. female presenting on 08/14/2018 for Medical Management of Chronic Issues   1. Acquired hypothyroidism  Pt compliant with medications. Denies constipation, confusion, cold intolerance, swelling, or changes in hair. She does have dry skin. Last TSH was 0.722 on 07/01/2017.   2. Diabetic gastroparesis associated with type 1 diabetes mellitus ([place])  Symptoms well controlled with Reglan, states she takes this 2-3 times per day as prescribed. Denies adverse reactions to medication.    3. Gastroesophageal reflux disease without esophagitis  Currently on Protonix. Compliant with medications. Denies adverse effects. States she is only symptomatic if she does not take her medication or eats something really spicy or greasy. Denies cough, dysphagia, or hemoptysis.    4. CKD (chronic kidney disease), stage III ([place])  Denies changes in urine output. States she does she a nephrologist, no notes in Epic, will request records.    5. Current moderate episode of major depressive disorder without prior episode ([place])  Pt reports compliance with her Wellbutrin and Lexapro. States she continues to have depressed moods and anhedonia. States this has been ongoing for several months. States her husband is sick in the hospital and this has caused an increase in her depression. Denies suicidal ideations.      Office Visit from 08/14/2018 in [facility]  PHQ-9 Total Score  9       6. Hyperlipidemia due to type 1 diabetes mellitus ([place])  Compliant with her Crestor. Denies myalgias, chest pain, palpitations, confusion, or weakness. Does not watch her diet or exercise on a regular basis.   7. Hypertension associated with type 1 diabetes mellitus ([place])  Compliant with medications, does not check blood  pressures at home. Does not watch her diet or exercise on a regular basis. Denies chest pain, shortness of breath, palpitations, visual changes, headaches, leg swelling, or confusion.    8. Chronic systolic congestive heart failure, NYHA class 3 (HCC)  Followed by the heart failure clinic. Denies swelling, chest pain, shortness of breath, or orthopnea. States she is compliant with her medications and follows up with Dr. Haroldine Laws on a regular basis.    Pt is followed by Dr. Buddy Duty for her diabetes and Dr. Haroldine Laws for her heart failure and blood pressure.    Relevant past medical, surgical, family, and social history reviewed and updated as indicated.  Allergies and medications reviewed and updated.   Past Medical History:  Diagnosis Date  . Arthritis   . CHF (congestive heart failure) ([place])   . Coronary artery disease    angina.  MI.   . Depression    anxiety  . Diabetes mellitus    on insulin, with h/o DKA   . GERD (gastroesophageal reflux disease)    Hiatal hernia.   Marland Kitchen HTN (hypertension)   . Hyperlipidemia   . Hypertension   . Hypothyroidism   . Left displaced femoral neck fracture ([place]) 11/19/2014  . Myocardial infarction [facility])    NSTEMI 07/2011 with cardiogenic shock, s/p CABG 09/2011  . Osteoporosis   . Pneumonia       . Tuberculosis   . Venous stasis ulcers (HCC)     Past Surgical History:  Procedure Laterality Date  . CARDIAC CATHETERIZATION    . COLONOSCOPY N/A 11/18/2013  Procedure: COLONOSCOPY;  Surgeon: Lafayette Dragon, MD;  Location: [facility] ENDOSCOPY;  Service: Endoscopy;  Laterality: N/A;  . CORONARY ARTERY BYPASS GRAFT     Dr. Prescott Gum in 09/2011   . CORONARY ARTERY BYPASS GRAFT  2012  . ESOPHAGOGASTRODUODENOSCOPY N/A 11/17/2013   Procedure: ESOPHAGOGASTRODUODENOSCOPY (EGD);  Surgeon: Lafayette Dragon, MD;  Location: [facility] ENDOSCOPY;  Service: Endoscopy;  Laterality: N/A;  . LEFT HEART CATHETERIZATION WITH CORONARY ANGIOGRAM N/A 07/26/2011   Procedure: LEFT HEART  CATHETERIZATION WITH CORONARY ANGIOGRAM;  Surgeon: Larey Dresser, MD;  Location: [facility] CATH LAB;  Service: Cardiovascular;  Laterality: N/A;  . RIGHT HEART CATHETERIZATION N/A 07/29/2011   Procedure: RIGHT HEART CATH;  Surgeon: Minus Breeding, MD;  Location: [facility] CATH LAB;  Service: Cardiovascular;  Laterality: N/A;  . TONSILLECTOMY    . TOTAL HIP ARTHROPLASTY Left 11/21/2014   Procedure: TOTAL HIP ARTHROPLASTY ANTERIOR APPROACH;  Surgeon: Mcarthur Rossetti, MD;  Location: [place];  Service: Orthopedics;  Laterality: Left;    Social History   Socioeconomic History  . Marital status: Married    Spouse name: Grayland Ormond [phone]; Son Rush Landmark  . Number of children: 2  . Years of education: Not on file  . Highest education level: Not on file  Occupational History  . Not on file  Social Needs  . Financial resource strain: Not on file  . Food insecurity:    Worry: Not on file    Inability: Not on file  . Transportation needs:    Medical: Not on file    Non-medical: Not on file  Tobacco Use  . Smoking status: Never Smoker  . Smokeless tobacco: Never Used  Substance and Sexual Activity  . Alcohol use: No  . Drug use: No  . Sexual activity: Yes    Birth control/protection: Post-menopausal  Lifestyle  . Physical activity:    Days per week: Not on file    Minutes per session: Not on file  . Stress: Not on file  Relationships  . Social connections:    Talks on phone: Not on file    Gets together: Not on file    Attends religious service: Not on file    Active member of club or organization: Not on file    Attends meetings of clubs or organizations: Not on file    Relationship status: Not on file  . Intimate partner violence:    Fear of current or ex partner: Not on file    Emotionally abused: Not on file    Physically abused: Not on file    Forced sexual activity: Not on file  Other Topics Concern  . Not on file  Social History Narrative   ** Merged History Encounter **       Bridget Martinez (Husband): [phone] [facility]) [phone] (Cell)    Outpatient Encounter Medications as of 08/14/2018  Medication Sig  . aspirin 81 MG EC tablet Take 1 tablet (81 mg total) by mouth daily.  Marland Kitchen buPROPion (WELLBUTRIN XL) 150 MG 24 hr tablet Take 1 tablet (150 mg total) by mouth 2 (two) times daily.  . carvedilol (COREG) 6.25 MG tablet Take 1 tablet (6.25 mg total) by mouth 2 (two) times daily with a meal. Call for office visit [phone]  . ENTRESTO 97-103 MG TAKE 1 TABLET BY MOUTH TWICE A DAY  . Insulin Detemir (LEVEMIR FLEXPEN) 100 UNIT/ML Pen Inject 24 Units into the skin at bedtime.   . insulin lispro (HUMALOG) 100 UNIT/ML injection Inject 1-22 Units  into the skin 3 (three) times daily before meals.  Marland Kitchen levothyroxine (SYNTHROID, LEVOTHROID) 100 MCG tablet Take 100 mcg by mouth daily before breakfast.  . metoCLOPramide (REGLAN) 5 MG tablet TAKE 1 TABLET (5 MG TOTAL) BY MOUTH 3 (THREE) TIMES DAILY BEFORE MEALS.  Marland Kitchen pantoprazole (PROTONIX) 40 MG tablet TAKE 1 TABLET BY MOUTH EVERY DAY  . potassium chloride SA (KLOR-CON M20) 20 MEQ tablet Take 1 tablet (20 mEq total) by mouth daily as needed.  . rosuvastatin (CRESTOR) 40 MG tablet TAKE 1 TABLET BY MOUTH EVERY DAY  . [DISCONTINUED] buPROPion (WELLBUTRIN XL) 150 MG 24 hr tablet TAKE 1 TABLET BY MOUTH TWICE A DAY  . [DISCONTINUED] escitalopram (LEXAPRO) 20 MG tablet TAKE 1 TABLET BY MOUTH EVERY DAY  . [DISCONTINUED] metoCLOPramide (REGLAN) 5 MG tablet TAKE 1 TABLET (5 MG TOTAL) BY MOUTH 3 (THREE) TIMES DAILY BEFORE MEALS.  . [DISCONTINUED] pantoprazole (PROTONIX) 40 MG tablet TAKE 1 TABLET BY MOUTH EVERY DAY  . [DISCONTINUED] rosuvastatin (CRESTOR) 40 MG tablet TAKE 1 TABLET BY MOUTH EVERY DAY  . escitalopram (LEXAPRO) 20 MG tablet Take 1.5 tablets (30 mg total) by mouth daily.  . furosemide (LASIX) 20 MG tablet Take 1 tablet (20 mg total) by mouth daily as needed. (Patient not taking: Reported on 05/26/2018)   No facility-administered  encounter medications on file as of 08/14/2018.     Allergies  Allergen Reactions  . Penicillins Anaphylaxis and Hives    Tolerates zosyn Has patient had a PCN reaction causing immediate rash, facial/tongue/throat swelling, SOB or lightheadedness with hypotension: Yes Has patient had a PCN reaction causing severe rash involving mucus membranes or skin necrosis: No Has patient had a PCN reaction that required hospitalization: Yes Has patient had a PCN reaction occurring within the last 10 years: No If all of the above answers are "NO", then may proceed with Cephalosporin use.   . Sulfa Antibiotics Anaphylaxis and Swelling    Stiff neck also   . Macrobid [employer] Macro] Other (See Comments)    Skin peels off  . Ciprofloxacin Hcl Other (See Comments)    Skin peeling    Review of Systems  Constitutional: Positive for activity change. Negative for appetite change, chills, fatigue and fever.  HENT: Negative for trouble swallowing and voice change.   Eyes: Negative for photophobia and visual disturbance.  Respiratory: Negative for cough, choking, chest tightness and shortness of breath.   Cardiovascular: Negative for chest pain, palpitations and leg swelling.  Gastrointestinal: Positive for nausea (intermittent, controlled with Reglan). Negative for abdominal pain, constipation, diarrhea and vomiting.  Endocrine: Negative for cold intolerance and heat intolerance.  Genitourinary: Negative for decreased urine volume, difficulty urinating, enuresis and flank pain.  Skin:       Dry skin  Neurological: Negative for dizziness, tremors, seizures, syncope, facial asymmetry, speech difficulty, weakness, light-headedness, numbness and headaches.  Psychiatric/Behavioral: Positive for dysphoric mood. Negative for confusion, self-injury and suicidal ideas.        Objective:    BP (!) 134/58   Pulse 70   Temp 97.8 F (36.6 C) (Oral)   Ht 5' 4" (1.626 m)   Wt 153 lb (69.4 kg)    BMI 26.26 kg/m    Wt Readings from Last 3 Encounters:  08/14/18 153 lb (69.4 kg)  05/26/18 151 lb 3.2 oz (68.6 kg)  03/27/18 140 lb (63.5 kg)    Physical Exam  Constitutional: She is oriented to person, place, and time. She appears well-developed and well-nourished. She  is cooperative. No distress.  HENT:  Head: Normocephalic and atraumatic.  Right Ear: Hearing, tympanic membrane, external ear and ear canal normal.  Left Ear: Hearing, tympanic membrane, external ear and ear canal normal.  Nose: Nose normal.  Mouth/Throat: Uvula is midline, oropharynx is clear and moist and mucous membranes are normal.  Eyes: Pupils are equal, round, and reactive to light. Conjunctivae, EOM and lids are normal.  Neck: Trachea normal, full passive range of motion without pain and phonation normal. Neck supple. No JVD present. Carotid bruit is not present. No thyroid mass and no thyromegaly present.  Cardiovascular: Normal rate, regular rhythm, S1 normal, S2 normal, normal heart sounds and normal pulses. PMI is not displaced. Exam reveals no S3.  Pulses:      Dorsalis pedis pulses are 2+ on the right side, and 2+ on the left side.       Posterior tibial pulses are 2+ on the right side, and 2+ on the left side.  Pulmonary/Chest: Effort normal and breath sounds normal.  Abdominal: Soft. Normal appearance and bowel sounds are normal.  Feet:  Right Foot:  Skin Integrity: Positive for callus.  Left Foot:  Skin Integrity: Positive for callus.  Lymphadenopathy:    She has no cervical adenopathy.  Neurological: She is alert and oriented to person, place, and time. She has normal strength and normal reflexes. No cranial nerve deficit or sensory deficit.  Skin: Skin is warm and dry. Capillary refill takes less than 2 seconds.  Xerosis to bilateral lower extremities and feet. All toenails: yellow discoloration with thickening, subungual hyperkeratosis.   Psychiatric: Her speech is normal and behavior is normal.  Judgment and thought content normal. Cognition and memory are normal. She exhibits a depressed mood. She expresses no homicidal and no suicidal ideation. She expresses no suicidal plans and no homicidal plans.    Results for orders placed or performed during the hospital encounter of 03/27/18  Lipase, blood  Result Value Ref Range   Lipase 32 11 - 51 U/L  Comprehensive metabolic panel  Result Value Ref Range   Sodium 135 135 - 145 mmol/L   Potassium 4.1 3.5 - 5.1 mmol/L   Chloride 108 98 - 111 mmol/L   CO2 18 (L) 22 - 32 mmol/L   Glucose, Bld 299 (H) 70 - 99 mg/dL   BUN 31 (H) 8 - 23 mg/dL   Creatinine, Ser 1.40 (H) 0.44 - 1.00 mg/dL   Calcium 8.8 (L) 8.9 - 10.3 mg/dL   Total Protein 5.8 (L) 6.5 - 8.1 g/dL   Albumin 3.4 (L) 3.5 - 5.0 g/dL   AST 16 15 - 41 U/L   ALT 15 0 - 44 U/L   Alkaline Phosphatase 73 38 - 126 U/L   Total Bilirubin 0.5 0.3 - 1.2 mg/dL   GFR calc non Af Amer 38 (L) >60 mL/min   GFR calc Af Amer 44 (L) >60 mL/min   Anion gap 9 5 - 15  CBC  Result Value Ref Range   WBC 14.7 (H) 4.0 - 10.5 K/uL   RBC 3.63 (L) 3.87 - 5.11 MIL/uL   Hemoglobin 10.1 (L) 12.0 - 15.0 g/dL   HCT 32.6 (L) 36.0 - 46.0 %   MCV 89.8 78.0 - 100.0 fL   MCH 27.8 26.0 - 34.0 pg   MCHC 31.0 30.0 - 36.0 g/dL   RDW 14.4 11.5 - 15.5 %   Platelets 248 150 - 400 K/uL  Urinalysis, Routine w reflex microscopic  Result  Value Ref Range   Color, Urine YELLOW YELLOW   APPearance CLEAR CLEAR   Specific Gravity, Urine 1.015 1.005 - 1.030   pH 5.0 5.0 - 8.0   Glucose, UA >=500 (A) NEGATIVE mg/dL   Hgb urine dipstick MODERATE (A) NEGATIVE   Bilirubin Urine NEGATIVE NEGATIVE   Ketones, ur NEGATIVE NEGATIVE mg/dL   Protein, ur NEGATIVE NEGATIVE mg/dL   Nitrite NEGATIVE NEGATIVE   Leukocytes, UA NEGATIVE NEGATIVE   RBC / HPF 6-10 0 - 5 RBC/hpf   WBC, UA 6-10 0 - 5 WBC/hpf   Bacteria, UA MANY (A) NONE SEEN   Squamous Epithelial / LPF 0-5 0 - 5   Mucus PRESENT   POC CBG, ED  Result Value Ref Range    Glucose-Capillary 307 (H) 70 - 99 mg/dL  CBG monitoring, ED  Result Value Ref Range   Glucose-Capillary 254 (H) 70 - 99 mg/dL      Total time spent with patient 45.  Greater than 50% of encounter spent in coordination of care/counseling.  Pertinent labs & imaging results that were available during my care of the patient were reviewed by me and considered in my medical decision making.  Assessment & Plan:  Azariah was seen today for medical management of chronic issues.  Diagnoses and all orders for this visit:  Acquired hypothyroidism -     TSH  Diabetic gastroparesis associated with type 1 diabetes mellitus (HCC) -     metoCLOPramide (REGLAN) 5 MG tablet; TAKE 1 TABLET (5 MG TOTAL) BY MOUTH 3 (THREE) TIMES DAILY BEFORE MEALS. -     CMP14+EGFR  Gastroesophageal reflux disease without esophagitis -     pantoprazole (PROTONIX) 40 MG tablet; TAKE 1 TABLET BY MOUTH EVERY DAY  CKD (chronic kidney disease), stage III (HCC) -     CBC with Differential/Platelet -     CMP14+EGFR -     Microalbumin / creatinine urine ratio  Current moderate episode of major depressive disorder without prior episode (HCC) -     buPROPion (WELLBUTRIN XL) 150 MG 24 hr tablet; Take 1 tablet (150 mg total) by mouth 2 (two) times daily. -     escitalopram (LEXAPRO) 20 MG tablet; Take 1.5 tablets (30 mg total) by mouth daily.  Hyperlipidemia due to type 1 diabetes mellitus (HCC) -     rosuvastatin (CRESTOR) 40 MG tablet; TAKE 1 TABLET BY MOUTH EVERY DAY -     Lipid panel  Hypertension associated with type 1 diabetes mellitus (HCC) -     CBC with Differential/Platelet -     CMP14+EGFR -     Microalbumin / creatinine urine ratio  Chronic systolic congestive heart failure, NYHA class 3 (HCC) -     CMP14+EGFR  Callus of foot -     Ambulatory referral to Podiatry  Onychomycosis of multiple toenails with type 1 diabetes mellitus ([place]) -     Ambulatory referral to Podiatry  Encounter for  immunization -     Flu vaccine HIGH DOSE PF    Diet and exercise encouraged.  Labs pending.  Medications as prescribed.  Continue all other maintenance medications.  Follow up plan: Return in about 4 weeks (around 09/11/2018), or if symptoms worsen or fail to improve.  Educational handout given for major depressive disorder  The above assessment and management plan was discussed with the patient. The patient verbalized understanding of and has agreed to the management plan. Patient is aware to call the clinic if symptoms persist or worsen.  Patient is aware when to return to the clinic for a follow-up visit. Patient educated on when it is appropriate to go to the emergency department.   Monia Pouch, FNP-C [facility] [phone]

## 2018-08-15 LAB — MICROALBUMIN / CREATININE URINE RATIO
Creatinine, Urine: 74.4 mg/dL
MICROALB/CREAT RATIO: 350.1 mg/g{creat} — AB (ref 0.0–30.0)
MICROALBUM., U, RANDOM: 260.5 ug/mL

## 2018-08-15 LAB — CBC WITH DIFFERENTIAL/PLATELET
BASOS ABS: 0 10*3/uL (ref 0.0–0.2)
Basos: 1 %
EOS (ABSOLUTE): 0.1 10*3/uL (ref 0.0–0.4)
Eos: 1 %
HEMOGLOBIN: 9.9 g/dL — AB (ref 11.1–15.9)
Hematocrit: 30.6 % — ABNORMAL LOW (ref 34.0–46.6)
IMMATURE GRANS (ABS): 0 10*3/uL (ref 0.0–0.1)
Immature Granulocytes: 0 %
LYMPHS: 18 %
Lymphocytes Absolute: 1.3 10*3/uL (ref 0.7–3.1)
MCH: 27.4 pg (ref 26.6–33.0)
MCHC: 32.4 g/dL (ref 31.5–35.7)
MCV: 85 fL (ref 79–97)
MONOCYTES: 6 %
Monocytes Absolute: 0.5 10*3/uL (ref 0.1–0.9)
NEUTROS PCT: 74 %
Neutrophils Absolute: 5.3 10*3/uL (ref 1.4–7.0)
Platelets: 267 10*3/uL (ref 150–450)
RBC: 3.61 x10E6/uL — ABNORMAL LOW (ref 3.77–5.28)
RDW: 13.7 % (ref 12.3–15.4)
WBC: 7.2 10*3/uL (ref 3.4–10.8)

## 2018-08-15 LAB — LIPID PANEL
Chol/HDL Ratio: 1.9 ratio (ref 0.0–4.4)
Cholesterol, Total: 144 mg/dL (ref 100–199)
HDL: 75 mg/dL (ref >39)
LDL CALC: 57 mg/dL (ref 0–99)
Triglycerides: 58 mg/dL (ref 0–149)
VLDL CHOLESTEROL CAL: 12 mg/dL (ref 5–40)

## 2018-08-15 LAB — CMP14+EGFR
ALBUMIN: 4.3 g/dL (ref 3.6–4.8)
ALT: 14 IU/L (ref 0–32)
AST: 10 IU/L (ref 0–40)
Albumin/Globulin Ratio: 2.3 — ABNORMAL HIGH (ref 1.2–2.2)
Alkaline Phosphatase: 95 IU/L (ref 39–117)
BILIRUBIN TOTAL: 0.4 mg/dL (ref 0.0–1.2)
BUN / CREAT RATIO: 22 (ref 12–28)
BUN: 27 mg/dL (ref 8–27)
CO2: 21 mmol/L (ref 20–29)
Calcium: 9.6 mg/dL (ref 8.7–10.3)
Chloride: 102 mmol/L (ref 96–106)
Creatinine, Ser: 1.25 mg/dL — ABNORMAL HIGH (ref 0.57–1.00)
GFR calc non Af Amer: 44 mL/min/{1.73_m2} — ABNORMAL LOW (ref >59)
GFR, EST AFRICAN AMERICAN: 51 mL/min/{1.73_m2} — AB (ref >59)
GLUCOSE: 192 mg/dL — AB (ref 65–99)
Globulin, Total: 1.9 g/dL (ref 1.5–4.5)
Potassium: 4.6 mmol/L (ref 3.5–5.2)
Sodium: 138 mmol/L (ref 134–144)
TOTAL PROTEIN: 6.2 g/dL (ref 6.0–8.5)

## 2018-08-15 LAB — TSH: TSH: 0.3 u[IU]/mL — AB (ref 0.450–4.500)

## 2018-08-17 ENCOUNTER — Other Ambulatory Visit: Payer: Self-pay | Admitting: Family Medicine

## 2018-08-17 DIAGNOSIS — E039 Hypothyroidism, unspecified: Secondary | ICD-10-CM

## 2018-08-17 MED ORDER — LEVOTHYROXINE SODIUM 88 MCG PO TABS: 88.0000 ug | ORAL_TABLET | Freq: Every day | ORAL | 3 refills | Status: DC

## 2018-08-17 NOTE — Progress Notes (Signed)
TSH 0.300. Will decrease levothyroxine to 35mcg daily and recheck TSH in 3 months.

## 2018-08-28 ENCOUNTER — Other Ambulatory Visit (HOSPITAL_COMMUNITY): Payer: Self-pay | Admitting: Internal Medicine

## 2018-09-05 ENCOUNTER — Other Ambulatory Visit: Payer: Self-pay | Admitting: Family Medicine

## 2018-09-05 DIAGNOSIS — F321 Major depressive disorder, single episode, moderate: Secondary | ICD-10-CM

## 2018-09-15 ENCOUNTER — Ambulatory Visit: Payer: Medicare Other | Admitting: Family Medicine

## 2018-09-29 ENCOUNTER — Ambulatory Visit: Payer: Medicare Other | Admitting: Family Medicine

## 2018-10-06 ENCOUNTER — Ambulatory Visit: Payer: Medicare Other | Admitting: Family Medicine

## 2018-10-06 ENCOUNTER — Encounter: Payer: Self-pay | Admitting: Family Medicine

## 2018-10-06 VITALS — BP 126/88 | HR 67 | Temp 97.8°F | Ht 64.0 in | Wt 156.0 lb

## 2018-10-06 DIAGNOSIS — Z23 Encounter for immunization: Secondary | ICD-10-CM

## 2018-10-06 DIAGNOSIS — F339 Major depressive disorder, recurrent, unspecified: Secondary | ICD-10-CM | POA: Diagnosis not present

## 2018-10-06 NOTE — Patient Instructions (Addendum)

## 2018-10-06 NOTE — Progress Notes (Signed)
    Subjective:   Bridget Martinez is an 68 y.o. female who presents for follow up of depression.  Onset approximately several years ago, gradually improving since that time.  Current symptoms include anhedonia, fatigue,.  Current treatment for depression:Medication Sleep problems: Absent   Early awakening:Absent   Energy: Fair Motivation: Fair Concentration: Good Rumination/worrying: Mild Memory: Good Tearfulness: Absent  Anxiety: Absent  Panic: Absent  Overall Mood: Moderately improved  Hopelessness: Absent Suicidal ideation: Absent  Other/Psychosocial Stressors: none Previous treatment modalities employed include Medication.  Organic causes of depression present: None.  Review of Systems Constitutional: positive for fatigue Eyes: negative Ears, nose, mouth, throat, and face: negative Respiratory: negative Cardiovascular: negative Gastrointestinal: negative Genitourinary:negative Musculoskeletal:negative Neurological: positive for headaches Behavioral/Psych: positive for depression and sleep disturbance   Objective:   Mental Status Examination: Posture and motor behavior: Appropriate Dress, grooming, personal hygiene: Appropriate Facial expression: Appropriate Speech: Appropriate Mood: Appropriate Coherency and relevance of thought: Appropriate Thought content: Appropriate Perceptions: Appropriate Orientation:Appropriate Attention and concentration: Appropriate Memory: : Appropriate Vocabulary: Appropriate Abstract reasoning: Appropriate Judgment: Appropriate    Assessment:   Experiencing the following symptoms of depression most of the day nearly every day for more than two consecutive weeks: depressed mood, loss of interests/pleasure, change in sleep, change in appetite or weight, loss of energy, trouble concentrating  Bridget Martinez was seen today for depression.  Diagnoses and all orders for this visit:  Depression, recurrent ([place]) Doing well since  increasing the lexapro. States she has noticed a great improvement. Denies associated side effects. Will continue with current therapy.  Other orders -     Pneumococcal conjugate vaccine 13-valent -     Tdap vaccine greater than or equal to 7yo IM     Suicide Risk Assessment: Negative   Plan:    Return in 3 months (on 01/05/2019), or if symptoms worsen or fail to improve.  Reviewed concept of depression as biochemical imbalance of neurotransmitters and rationale for treatment. Instructed patient to contact office or on-call physician promptly should condition worsen or any new symptoms appear and provided on-call telephone numbers.    The above assessment and management plan was discussed with the patient. The patient verbalized understanding of and has agreed to the management plan. Patient is aware to call the clinic if symptoms fail to improve or worsen. Patient is aware when to return to the clinic for a follow-up visit. Patient educated on when it is appropriate to go to the emergency department.   Monia Pouch, FNP-C [facility] [address] [place],  [address] [phone]

## 2018-10-13 ENCOUNTER — Encounter (INDEPENDENT_AMBULATORY_CARE_PROVIDER_SITE_OTHER): Payer: Medicare Other | Admitting: Ophthalmology

## 2018-10-13 DIAGNOSIS — H35033 Hypertensive retinopathy, bilateral: Secondary | ICD-10-CM

## 2018-10-13 DIAGNOSIS — E113393 Type 2 diabetes mellitus with moderate nonproliferative diabetic retinopathy without macular edema, bilateral: Secondary | ICD-10-CM | POA: Diagnosis not present

## 2018-10-13 DIAGNOSIS — H43813 Vitreous degeneration, bilateral: Secondary | ICD-10-CM

## 2018-10-13 DIAGNOSIS — I10 Essential (primary) hypertension: Secondary | ICD-10-CM

## 2018-10-13 DIAGNOSIS — E11319 Type 2 diabetes mellitus with unspecified diabetic retinopathy without macular edema: Secondary | ICD-10-CM | POA: Diagnosis not present

## 2018-10-13 LAB — HM DIABETES EYE EXAM

## 2018-11-04 ENCOUNTER — Other Ambulatory Visit: Payer: Self-pay | Admitting: *Deleted

## 2018-12-04 ENCOUNTER — Other Ambulatory Visit: Payer: Self-pay | Admitting: Family Medicine

## 2018-12-04 ENCOUNTER — Other Ambulatory Visit (HOSPITAL_COMMUNITY): Payer: Self-pay | Admitting: Internal Medicine

## 2018-12-04 DIAGNOSIS — F321 Major depressive disorder, single episode, moderate: Secondary | ICD-10-CM

## 2019-01-05 ENCOUNTER — Other Ambulatory Visit: Payer: Self-pay

## 2019-01-05 ENCOUNTER — Encounter: Payer: Self-pay | Admitting: Family Medicine

## 2019-01-05 ENCOUNTER — Ambulatory Visit (INDEPENDENT_AMBULATORY_CARE_PROVIDER_SITE_OTHER): Payer: Medicare Other | Admitting: Family Medicine

## 2019-01-05 DIAGNOSIS — F339 Major depressive disorder, recurrent, unspecified: Secondary | ICD-10-CM

## 2019-01-05 DIAGNOSIS — K219 Gastro-esophageal reflux disease without esophagitis: Secondary | ICD-10-CM

## 2019-01-05 DIAGNOSIS — E039 Hypothyroidism, unspecified: Secondary | ICD-10-CM | POA: Diagnosis not present

## 2019-01-05 DIAGNOSIS — E1069 Type 1 diabetes mellitus with other specified complication: Secondary | ICD-10-CM | POA: Diagnosis not present

## 2019-01-05 DIAGNOSIS — E1159 Type 2 diabetes mellitus with other circulatory complications: Secondary | ICD-10-CM | POA: Diagnosis not present

## 2019-01-05 DIAGNOSIS — E1065 Type 1 diabetes mellitus with hyperglycemia: Secondary | ICD-10-CM

## 2019-01-05 DIAGNOSIS — K3184 Gastroparesis: Secondary | ICD-10-CM

## 2019-01-05 DIAGNOSIS — E1049 Type 1 diabetes mellitus with other diabetic neurological complication: Secondary | ICD-10-CM

## 2019-01-05 DIAGNOSIS — I110 Hypertensive heart disease with heart failure: Secondary | ICD-10-CM

## 2019-01-05 DIAGNOSIS — E1043 Type 1 diabetes mellitus with diabetic autonomic (poly)neuropathy: Secondary | ICD-10-CM

## 2019-01-05 DIAGNOSIS — I5022 Chronic systolic (congestive) heart failure: Secondary | ICD-10-CM

## 2019-01-05 DIAGNOSIS — E785 Hyperlipidemia, unspecified: Secondary | ICD-10-CM

## 2019-01-05 DIAGNOSIS — IMO0002 Reserved for concepts with insufficient information to code with codable children: Secondary | ICD-10-CM

## 2019-01-05 DIAGNOSIS — I10 Essential (primary) hypertension: Principal | ICD-10-CM

## 2019-01-05 MED ORDER — LEVOTHYROXINE SODIUM 88 MCG PO TABS: 88.0000 ug | ORAL_TABLET | Freq: Every day | ORAL | 3 refills | Status: DC

## 2019-01-05 MED ORDER — PANTOPRAZOLE SODIUM 40 MG PO TBEC: DELAYED_RELEASE_TABLET | ORAL | 3 refills | Status: DC

## 2019-01-05 MED ORDER — ROSUVASTATIN CALCIUM 40 MG PO TABS: ORAL_TABLET | ORAL | 3 refills | Status: DC

## 2019-01-05 MED ORDER — BUPROPION HCL ER (XL) 150 MG PO TB24: 150.0000 mg | ORAL_TABLET | Freq: Two times a day (BID) | ORAL | 3 refills | Status: DC

## 2019-01-05 MED ORDER — METOCLOPRAMIDE HCL 5 MG PO TABS: ORAL_TABLET | ORAL | 3 refills | Status: DC

## 2019-01-05 MED ORDER — ESCITALOPRAM OXALATE 20 MG PO TABS: 30.0000 mg | ORAL_TABLET | Freq: Every day | ORAL | 1 refills | Status: DC

## 2019-01-05 NOTE — Progress Notes (Signed)
Virtual Visit via telephone Note Due to COVID-19, visit is conducted virtually and was requested by patient. This visit type was conducted due to national recommendations for restrictions regarding the COVID-19 Pandemic (e.g. social distancing) in an effort to limit this patient's exposure and mitigate transmission in our community.  Due to her co-morbid illnesses, this patient is at least at moderate risk for complications without adequate follow up.  This format is felt to be most appropriate for this patient at this time.  All issues noted in this document were discussed and addressed.  A physical exam was not performed with this format.   I connected with Bridget Martinez on 01/05/19 at [place] by telephone and verified that I am speaking with the correct person using two identifiers. Bridget Martinez is currently located at home and family is currently with them during visit. The provider, Monia Pouch, FNP is located in their office at time of visit.  I discussed the limitations, risks, security and privacy concerns of performing an evaluation and management service by telephone and the availability of in person appointments. I also discussed with the patient that there may be a patient responsible charge related to this service. The patient expressed understanding and agreed to proceed.  Subjective:  Patient ID: Bridget Martinez, female    DOB: 11/18/1950, 68 y.o.   MRN: [id number]  Chief Complaint:  Medical Management of Chronic Issues   HPI: Bridget Martinez is a 68 y.o. female presenting on 01/05/2019 for Medical Management of Chronic Issues   1. Hypertension associated with diabetes ([place]) Followed by Dr. Haroldine Laws.  Complaint with meds - Yes Checking BP at home - No Exercising Regularly - No Watching Salt intake - Yes Pertinent ROS:  Headache - No Chest pain - No Dyspnea - No Palpitations - No LE edema - No They report good compliance with medications and can restate their  regimen by memory. No medication side effects.  BP Readings from Last 3 Encounters:  10/06/18 126/88  08/14/18 (!) 134/58  05/26/18 (!) 142/68     2. Chronic systolic congestive heart failure, NYHA class 3 (HCC) Followed by Dr. Haroldine Laws. Doing well. Denies weight changes. Compliant with medications. No SHOB, orthopnea, palpitations, CP, increased fatigue, or swelling.   3. Hyperlipidemia due to type 1 diabetes mellitus ([place]) Compliant with medications. Tries to watch her diet. Does not exercise on a regular basis.   4. Acquired hypothyroidism Compliant with medications. Denies weight, skin, hair, or nail changes. Still has some depression, but it is improving. Bowel habits are normal. No excessive fatigue, cold or heat intolerance, or palpitations.   5. DM (diabetes mellitus), type 1, uncontrolled w/neurologic complication ([place]) Followed by Dr. Buddy Duty. Will request A1C from February. Did have eye exam in February, will request records.  Compliant with meds - Yes Checking CBGs? Yes  Fasting avg - 105 Exercising regularly? - No Watching carbohydrate intake? - Yes Neuropathy ? - Yes Hypoglycemic events - No Pertinent ROS:  Polyuria - No Polydipsia - No Vision problems - No   6. Recurrent depression ([place]) Doing well on current medications. States she feels a little better. She still has some depressed days and fatigue. No SI or HI. Compliant with medications without associated side effects.  Depression screen [facility] 2/9 01/05/2019 10/06/2018 08/14/2018 07/29/2017 07/01/2017  Decreased Interest 1 1 1 1 1   Down, Depressed, Hopeless 1 0 1 1 1   PHQ - 2 Score 2 1 2 2 2   Altered sleeping  0 0 3 1 1   Tired, decreased energy 1 1 2 1 1   Change in appetite 0 0 0 1 1  Feeling bad or failure about yourself  0 0 2 1 1   Trouble concentrating 0 0 0 1 0  Moving slowly or fidgety/restless 0 0 0 1 1  Suicidal thoughts - 0 0 0 0  PHQ-9 Score 3 2 9 8 7   Difficult doing work/chores - Not difficult at  all Somewhat difficult - -     7. Diabetic gastroparesis associated with type 1 diabetes mellitus ([place]) Reglan does well to help control nausea and vomiting. States she has not vomited in several days. Does have slight nausea here and there. No hemoptysis. No diarrhea or constipation.   8. Gastroesophageal reflux disease without esophagitis Doing well on current medications. No sore throat, cough, voice changes, dysphagia, or hemoptysis.     Relevant past medical, surgical, family, and social history reviewed and updated as indicated.  Allergies and medications reviewed and updated.   Past Medical History:  Diagnosis Date  . Arthritis   . CHF (congestive heart failure) ([place])   . Coronary artery disease    angina.  MI.   . Depression    anxiety  . Diabetes mellitus    on insulin, with h/o DKA   . GERD (gastroesophageal reflux disease)    Hiatal hernia.   Marland Kitchen HTN (hypertension)   . Hyperlipidemia   . Hypertension   . Hypothyroidism   . Left displaced femoral neck fracture ([place]) 11/19/2014  . Myocardial infarction [facility])    NSTEMI 07/2011 with cardiogenic shock, s/p CABG 09/2011  . Osteoporosis   . Pneumonia       . Tuberculosis   . Venous stasis ulcers (HCC)     Past Surgical History:  Procedure Laterality Date  . CARDIAC CATHETERIZATION    . COLONOSCOPY N/A 11/18/2013   Procedure: COLONOSCOPY;  Surgeon: Lafayette Dragon, MD;  Location: [facility] ENDOSCOPY;  Service: Endoscopy;  Laterality: N/A;  . CORONARY ARTERY BYPASS GRAFT     Dr. Prescott Gum in 09/2011   . CORONARY ARTERY BYPASS GRAFT  2012  . ESOPHAGOGASTRODUODENOSCOPY N/A 11/17/2013   Procedure: ESOPHAGOGASTRODUODENOSCOPY (EGD);  Surgeon: Lafayette Dragon, MD;  Location: [facility] ENDOSCOPY;  Service: Endoscopy;  Laterality: N/A;  . LEFT HEART CATHETERIZATION WITH CORONARY ANGIOGRAM N/A 07/26/2011   Procedure: LEFT HEART CATHETERIZATION WITH CORONARY ANGIOGRAM;  Surgeon: Larey Dresser, MD;  Location: [facility] CATH LAB;  Service:  Cardiovascular;  Laterality: N/A;  . RIGHT HEART CATHETERIZATION N/A 07/29/2011   Procedure: RIGHT HEART CATH;  Surgeon: Minus Breeding, MD;  Location: [facility] CATH LAB;  Service: Cardiovascular;  Laterality: N/A;  . TONSILLECTOMY    . TOTAL HIP ARTHROPLASTY Left 11/21/2014   Procedure: TOTAL HIP ARTHROPLASTY ANTERIOR APPROACH;  Surgeon: Mcarthur Rossetti, MD;  Location: [place];  Service: Orthopedics;  Laterality: Left;    Social History   Socioeconomic History  . Marital status: Married    Spouse name: Grayland Ormond [phone]; Son Rush Landmark  . Number of children: 2  . Years of education: Not on file  . Highest education level: Not on file  Occupational History  . Not on file  Social Needs  . Financial resource strain: Not on file  . Food insecurity:    Worry: Not on file    Inability: Not on file  . Transportation needs:    Medical: Not on file    Non-medical: Not on file  Tobacco Use  .  Smoking status: Never Smoker  . Smokeless tobacco: Never Used  Substance and Sexual Activity  . Alcohol use: No  . Drug use: No  . Sexual activity: Yes    Birth control/protection: Post-menopausal  Lifestyle  . Physical activity:    Days per week: Not on file    Minutes per session: Not on file  . Stress: Not on file  Relationships  . Social connections:    Talks on phone: Not on file    Gets together: Not on file    Attends religious service: Not on file    Active member of club or organization: Not on file    Attends meetings of clubs or organizations: Not on file    Relationship status: Not on file  . Intimate partner violence:    Fear of current or ex partner: Not on file    Emotionally abused: Not on file    Physically abused: Not on file    Forced sexual activity: Not on file  Other Topics Concern  . Not on file  Social History Narrative   ** Merged History Encounter **       Bridget Martinez (Husband): [phone] [facility]) [phone] (Cell)    Outpatient Encounter Medications as of  01/05/2019  Medication Sig  . aspirin 81 MG EC tablet Take 1 tablet (81 mg total) by mouth daily.  Marland Kitchen buPROPion (WELLBUTRIN XL) 150 MG 24 hr tablet Take 1 tablet (150 mg total) by mouth 2 (two) times daily.  . carvedilol (COREG) 6.25 MG tablet TAKE 1 TABLET (6.25 MG TOTAL) BY MOUTH 2 TIMES DAILY WITH A MEAL. CALL FOR OFFICE VISIT [phone]  . ENTRESTO 97-103 MG TAKE 1 TABLET BY MOUTH TWICE A DAY  . escitalopram (LEXAPRO) 20 MG tablet Take 1.5 tablets (30 mg total) by mouth daily.  . furosemide (LASIX) 20 MG tablet Take 1 tablet (20 mg total) by mouth daily as needed. (Patient not taking: Reported on 05/26/2018)  . Insulin Detemir (LEVEMIR FLEXPEN) 100 UNIT/ML Pen Inject 24 Units into the skin at bedtime.   . insulin lispro (HUMALOG) 100 UNIT/ML injection Inject 1-22 Units into the skin 3 (three) times daily before meals.  Marland Kitchen levothyroxine (SYNTHROID) 88 MCG tablet Take 1 tablet (88 mcg total) by mouth daily.  . metoCLOPramide (REGLAN) 5 MG tablet TAKE 1 TABLET (5 MG TOTAL) BY MOUTH 3 (THREE) TIMES DAILY BEFORE MEALS.  Marland Kitchen ONE TOUCH ULTRA TEST test strip CHECK BLOOD SUGAR UP TO 3 TIMES DAILY  . pantoprazole (PROTONIX) 40 MG tablet TAKE 1 TABLET BY MOUTH EVERY DAY  . potassium chloride SA (KLOR-CON M20) 20 MEQ tablet Take 1 tablet (20 mEq total) by mouth daily as needed.  . rosuvastatin (CRESTOR) 40 MG tablet TAKE 1 TABLET BY MOUTH EVERY DAY  . [DISCONTINUED] buPROPion (WELLBUTRIN XL) 150 MG 24 hr tablet Take 1 tablet (150 mg total) by mouth 2 (two) times daily.  . [DISCONTINUED] escitalopram (LEXAPRO) 20 MG tablet TAKE 1 AND 1/2 TABLETS BY MOUTH DAILY  . [DISCONTINUED] levothyroxine (SYNTHROID, LEVOTHROID) 88 MCG tablet Take 1 tablet (88 mcg total) by mouth daily.  . [DISCONTINUED] metoCLOPramide (REGLAN) 5 MG tablet TAKE 1 TABLET (5 MG TOTAL) BY MOUTH 3 (THREE) TIMES DAILY BEFORE MEALS.  . [DISCONTINUED] pantoprazole (PROTONIX) 40 MG tablet TAKE 1 TABLET BY MOUTH EVERY DAY  . [DISCONTINUED]  rosuvastatin (CRESTOR) 40 MG tablet TAKE 1 TABLET BY MOUTH EVERY DAY   No facility-administered encounter medications on file as of 01/05/2019.  Allergies  Allergen Reactions  . Penicillins Anaphylaxis and Hives    Tolerates zosyn Has patient had a PCN reaction causing immediate rash, facial/tongue/throat swelling, SOB or lightheadedness with hypotension: Yes Has patient had a PCN reaction causing severe rash involving mucus membranes or skin necrosis: No Has patient had a PCN reaction that required hospitalization: Yes Has patient had a PCN reaction occurring within the last 10 years: No If all of the above answers are "NO", then may proceed with Cephalosporin use.   . Sulfa Antibiotics Anaphylaxis and Swelling    Stiff neck also   . Macrobid [employer] Macro] Other (See Comments)    Skin peels off  . Ciprofloxacin Hcl Other (See Comments)    Skin peeling    Review of Systems  Constitutional: Positive for fatigue. Negative for activity change, appetite change, chills, diaphoresis, fever and unexpected weight change.  HENT: Negative for sore throat, trouble swallowing and voice change.   Eyes: Negative for photophobia and visual disturbance.  Respiratory: Negative for cough, chest tightness, shortness of breath and wheezing.   Cardiovascular: Negative for chest pain, palpitations and leg swelling.  Gastrointestinal: Positive for nausea. Negative for abdominal pain, anal bleeding, blood in stool, constipation, diarrhea, rectal pain and vomiting.  Endocrine: Negative for cold intolerance, heat intolerance, polydipsia, polyphagia and polyuria.  Genitourinary: Negative for decreased urine volume, difficulty urinating, hematuria and urgency.  Musculoskeletal: Positive for myalgias. Negative for arthralgias.  Skin: Negative for color change, pallor, rash and wound.  Neurological: Negative for dizziness, tremors, seizures, syncope, facial asymmetry, speech difficulty,  weakness, light-headedness, numbness and headaches.  Hematological: Does not bruise/bleed easily.  Psychiatric/Behavioral: Positive for dysphoric mood. Negative for agitation, behavioral problems, confusion, decreased concentration, hallucinations, self-injury, sleep disturbance and suicidal ideas. The patient is not nervous/anxious and is not hyperactive.   All other systems reviewed and are negative.        Observations/Objective: No vital signs or physical exam, this was a telephone or virtual health encounter.  Pt alert and oriented, answers all questions appropriately, and able to speak in full sentences.    Assessment and Plan: Bridget Martinez was seen today for medical management of chronic issues.  Diagnoses and all orders for this visit:  Hypertension associated with diabetes ([place]) Followed by cardiology. Compliant with medications.   Chronic systolic congestive heart failure, NYHA class 3 ([place]) Followed by cardiology. No reported changes in status.   Hyperlipidemia due to type 1 diabetes mellitus ([place]) Diet and exercise encouraged. Continue medications as prescribed. Will recheck labs in 3 months.  -     rosuvastatin (CRESTOR) 40 MG tablet; TAKE 1 TABLET BY MOUTH EVERY DAY  Acquired hypothyroidism Compliant with medications. No reported changes in symptoms. Will recheck labs in 3 months.  -     levothyroxine (SYNTHROID) 88 MCG tablet; Take 1 tablet (88 mcg total) by mouth daily.  DM (diabetes mellitus), type 1, uncontrolled w/neurologic complication ([place]) Followed by endocrinology. Complaint with medications. Will request records for eye exam and labs.   Recurrent depression ([place]) Doing well on current medications. Will continue below. Report any new or worsening symptoms.  -     buPROPion (WELLBUTRIN XL) 150 MG 24 hr tablet; Take 1 tablet (150 mg total) by mouth 2 (two) times daily. -     escitalopram (LEXAPRO) 20 MG tablet; Take 1.5 tablets (30 mg total) by mouth daily.   Diabetic gastroparesis associated with type 1 diabetes mellitus ([place]) Doing well on Reglan. No new or worsening symptoms. Continue  below.  -     metoCLOPramide (REGLAN) 5 MG tablet; TAKE 1 TABLET (5 MG TOTAL) BY MOUTH 3 (THREE) TIMES DAILY BEFORE MEALS.  Gastroesophageal reflux disease without esophagitis Stable, continue below.  -     pantoprazole (PROTONIX) 40 MG tablet; TAKE 1 TABLET BY MOUTH EVERY DAY     Follow Up Instructions: Return in about 3 months (around 04/06/2019), or if symptoms worsen or fail to improve, for depression, thyroid, hyperlipidemia, GERD.  Appointment scheduled today.   I discussed the assessment and treatment plan with the patient. The patient was provided an opportunity to ask questions and all were answered. The patient agreed with the plan and demonstrated an understanding of the instructions.   The patient was advised to call back or seek an in-person evaluation if the symptoms worsen or if the condition fails to improve as anticipated.  The above assessment and management plan was discussed with the patient. The patient verbalized understanding of and has agreed to the management plan. Patient is aware to call the clinic if symptoms persist or worsen. Patient is aware when to return to the clinic for a follow-up visit. Patient educated on when it is appropriate to go to the emergency department.    I provided 25 minutes of non-face-to-face time during this encounter. The call started at [place]. The call ended at 0830.   Monia Pouch, FNP-C [facility] [address] [place], [place] [address] [phone]

## 2019-01-07 ENCOUNTER — Telehealth: Payer: Self-pay | Admitting: Family Medicine

## 2019-01-07 ENCOUNTER — Other Ambulatory Visit (HOSPITAL_COMMUNITY): Payer: Self-pay | Admitting: Internal Medicine

## 2019-01-07 NOTE — Telephone Encounter (Signed)
[employer] told her that Reglan could cause involuntary movements. She has noticed a mild head jerk. Pls advise

## 2019-01-07 NOTE — Telephone Encounter (Signed)
It can cause these side effects especially with long term use.

## 2019-01-07 NOTE — Telephone Encounter (Signed)
Pt aware.

## 2019-01-21 ENCOUNTER — Other Ambulatory Visit: Payer: Self-pay

## 2019-01-21 ENCOUNTER — Encounter: Payer: Self-pay | Admitting: *Deleted

## 2019-01-21 ENCOUNTER — Ambulatory Visit (INDEPENDENT_AMBULATORY_CARE_PROVIDER_SITE_OTHER): Payer: Medicare Other | Admitting: *Deleted

## 2019-01-21 DIAGNOSIS — Z0000 Encounter for general adult medical examination without abnormal findings: Secondary | ICD-10-CM | POA: Diagnosis not present

## 2019-01-21 NOTE — Patient Instructions (Signed)
  Bridget Martinez , Thank you for taking time to talk with me for your Medicare Wellness Visit. I appreciate your ongoing commitment to your health goals. Please review the following plan we discussed and let me know if I can assist you in the future.   These are the goals we discussed: Goals    . HEMOGLOBIN A1C < 7.0 (pt-stated)     Increase exercise - walk 3 times per week for 30 minutes Get continuous blood glucose monitor.        This is a list of the screening recommended for you and due dates:  Health Maintenance  Topic Date Due  . Hemoglobin A1C  07/13/2017  . Eye exam for diabetics  10/02/2017  . Complete foot exam   07/01/2018  . Mammogram  01/05/2020*  . DEXA scan (bone density measurement)  01/05/2020*  .  Hepatitis C: One time screening is recommended by Center for Disease Control  (CDC) for  adults born from [birth year] through 1966.   01/05/2020*  . Flu Shot  04/10/2019  . Urine Protein Check  08/15/2019  . Colon Cancer Screening  11/19/2023  . Tetanus Vaccine  10/06/2028  . Pneumonia vaccines  Completed  *Topic was postponed. The date shown is not the original due date.

## 2019-01-21 NOTE — Progress Notes (Signed)
MEDICARE ANNUAL WELLNESS VISIT  01/21/2019  Telephone Visit Disclaimer This Medicare AWV was conducted by telephone due to national recommendations for restrictions regarding the COVID-19 Pandemic (e.g. social distancing).  I verified, using two identifiers, that I am speaking with Bridget Martinez or their authorized healthcare agent. I discussed the limitations, risks, security, and privacy concerns of performing an evaluation and management service by telephone and the potential availability of an in-person appointment in the future. The patient expressed understanding and agreed to proceed.   Subjective:  Bridget Martinez is a 68 y.o. female patient of Bridget Martinez, Bridget Martinez who had a Medicare Annual Wellness Visit today via telephone. 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   she has a son and a daughter.  She states her husband has dementia and is currently living with her daughter.   she reports that she is socially active and does interact with friends/family regularly. she is minimally physically active and enjoys reading and painting.  Patient Care Team: Bridget Gouty, FNP as PCP - General (Family Medicine) Martinez, Bridget Pascal, MD (Cardiology) Bridget Rend, MD as Attending Physician (Internal Medicine)  Advanced Directives 01/21/2019 03/27/2018 03/06/2015 02/03/2015 11/19/2014 10/26/2014 10/21/2014  Does Patient Have a Medical Advance Directive? No No No No No No No  Does patient want to make changes to medical advance directive? - - - - - - No - Patient declined  Would patient like information on creating a medical advance directive? Yes (MAU/Ambulatory/Procedural Areas - Information given) Yes (ED - Information included in AVS) No - patient declined information No - patient declined information No - patient declined information No - patient declined information No - patient declined information  Pre-existing out of facility DNR order (yellow form or pink  MOST form) - - - - - - -    Hospital Utilization Over the Past 12 Months: # of hospitalizations or ER visits: 1- due to hypoglycemia # of surgeries: 0  Review of Systems    Patient reports that her overall health is unchanged compared to last year.   Review of Systems:   All systems negative as reported by patient.  Pain Assessment Pain Score: 0-No pain     Current Medications & Allergies (verified) Allergies as of 01/21/2019      Reactions   Penicillins Anaphylaxis, Hives   Tolerates zosyn Has patient had a PCN reaction causing immediate rash, facial/tongue/throat swelling, SOB or lightheadedness with hypotension: Yes Has patient had a PCN reaction causing severe rash involving mucus membranes or skin necrosis: No Has patient had a PCN reaction that required hospitalization: Yes Has patient had a PCN reaction occurring within the last 10 years: No If all of the above answers are "NO", then may proceed with Cephalosporin use.   Sulfa Antibiotics Anaphylaxis, Swelling   Stiff neck also    Macrobid [nitrofurantoin Monohyd Macro] Other (See Comments)   Skin peels off   Ciprofloxacin Hcl Other (See Comments)   Skin peeling      Medication List       Accurate as of Jan 21, 2019  2:11 PM. If you have any questions, ask your nurse or doctor.        aspirin 81 MG EC tablet Take 1 tablet (81 mg total) by mouth daily.   buPROPion 150 MG 24 hr tablet Commonly known as:  WELLBUTRIN XL Take 1 tablet (150 mg total) by mouth 2 (two) times daily.  carvedilol 6.25 MG tablet Commonly known as:  COREG TAKE 1 TABLET (6.25 MG TOTAL) BY MOUTH 2 TIMES DAILY WITH A MEAL. CALL FOR OFFICE VISIT [phone]   Entresto 97-103 MG Generic drug:  sacubitril-valsartan TAKE 1 TABLET BY MOUTH TWICE A DAY   escitalopram 20 MG tablet Commonly known as:  LEXAPRO Take 1.5 tablets (30 mg total) by mouth daily.   furosemide 20 MG tablet Commonly known as:  LASIX Take 1 tablet (20 mg  total) by mouth daily as needed.   HumaLOG 100 UNIT/ML injection Generic drug:  insulin lispro Inject 1-22 Units into the skin 3 (three) times daily before meals.   Levemir FlexPen 100 UNIT/ML Pen Generic drug:  Insulin Detemir Inject 24 Units into the skin at bedtime.   levothyroxine 88 MCG tablet Commonly known as:  SYNTHROID Take 1 tablet (88 mcg total) by mouth daily.   metoCLOPramide 5 MG tablet Commonly known as:  REGLAN TAKE 1 TABLET (5 MG TOTAL) BY MOUTH 3 (THREE) TIMES DAILY BEFORE MEALS. What changed:    how much to take  how to take this  when to take this  additional instructions   ONE TOUCH ULTRA TEST test strip Generic drug:  glucose blood CHECK BLOOD SUGAR UP TO 3 TIMES DAILY   pantoprazole 40 MG tablet Commonly known as:  PROTONIX TAKE 1 TABLET BY MOUTH EVERY DAY   potassium chloride SA 20 MEQ tablet Commonly known as:  Klor-Con M20 Take 1 tablet (20 mEq total) by mouth daily as needed.   rosuvastatin 40 MG tablet Commonly known as:  CRESTOR TAKE 1 TABLET BY MOUTH EVERY DAY       History (reviewed): Past Medical History:  Diagnosis Date  . Arthritis   . CHF (congestive heart failure) ([place])   . Coronary artery disease    angina.  MI.   . Depression    anxiety  . Diabetes mellitus    on insulin, with h/o DKA   . GERD (gastroesophageal reflux disease)    Hiatal hernia.   Marland Kitchen HTN (hypertension)   . Hyperlipidemia   . Hypertension   . Hypothyroidism   . Left displaced femoral neck fracture ([place]) 11/19/2014  . Myocardial infarction [facility])    NSTEMI 07/2011 with cardiogenic shock, s/p CABG 09/2011  . Osteoporosis   . Pneumonia       . Tuberculosis   . Venous stasis ulcers ([facility])    Past Surgical History:  Procedure Laterality Date  . CARDIAC CATHETERIZATION    . COLONOSCOPY N/A 11/18/2013   Procedure: COLONOSCOPY;  Surgeon: Lafayette Dragon, MD;  Location: [facility] ENDOSCOPY;  Service: Endoscopy;  Laterality: N/A;  . CORONARY ARTERY BYPASS GRAFT      Dr. Prescott Gum in 09/2011   . CORONARY ARTERY BYPASS GRAFT  2012  . ESOPHAGOGASTRODUODENOSCOPY N/A 11/17/2013   Procedure: ESOPHAGOGASTRODUODENOSCOPY (EGD);  Surgeon: Lafayette Dragon, MD;  Location: [facility] ENDOSCOPY;  Service: Endoscopy;  Laterality: N/A;  . LEFT HEART CATHETERIZATION WITH CORONARY ANGIOGRAM N/A 07/26/2011   Procedure: LEFT HEART CATHETERIZATION WITH CORONARY ANGIOGRAM;  Surgeon: Larey Dresser, MD;  Location: [facility] CATH LAB;  Service: Cardiovascular;  Laterality: N/A;  . RIGHT HEART CATHETERIZATION N/A 07/29/2011   Procedure: RIGHT HEART CATH;  Surgeon: Minus Breeding, MD;  Location: [facility] CATH LAB;  Service: Cardiovascular;  Laterality: N/A;  . TONSILLECTOMY    . TOTAL HIP ARTHROPLASTY Left 11/21/2014   Procedure: TOTAL HIP ARTHROPLASTY ANTERIOR APPROACH;  Surgeon: Mcarthur Rossetti, MD;  Location: [facility]  OR;  Service: Orthopedics;  Laterality: Left;   Family History  Problem Relation Age of Onset  . Heart disease Father   . Multiple sclerosis Father   . Hypertension Mother   . Hyperthyroidism Mother   . Diabetes Mother   . Heart attack Paternal Grandfather   . Heart disease Paternal Grandfather   . Diabetes Cousin        Multiple maternal cousins with type 2 diabetes mellitus  . Diabetes Maternal Uncle        Type 1 diabetes mellitus  . Healthy Daughter   . Asthma Son   . Diabetes Son    Social History   Socioeconomic History  . Marital status: Married    Spouse name: Grayland Ormond [phone]; Son Rush Landmark  . Number of children: 2  . Years of education: 13  . Highest education level: Bachelor's degree (e.g., BA, AB, BS)  Occupational History  . Not on file  Social Needs  . Financial resource strain: Not hard at all  . Food insecurity:    Worry: Never true    Inability: Never true  . Transportation needs:    Medical: No    Non-medical: No  Tobacco Use  . Smoking status: Never Smoker  . Smokeless tobacco: Never Used  Substance and Sexual Activity  . Alcohol use: No  .  Drug use: No  . Sexual activity: Not on file  Lifestyle  . Physical activity:    Days per week: 0 days    Minutes per session: 0 min  . Stress: To some extent  Relationships  . Social connections:    Talks on phone: More than three times a week    Gets together: More than three times a week    Attends religious service: More than 4 times per year    Active member of club or organization: No    Attends meetings of clubs or organizations: Never    Relationship status: Married  Other Topics Concern  . Not on file  Social History Narrative   ** Merged History Encounter **       Sedra Morfin (Husband): [phone] [facility]) [phone] (Cell)    Activities of Daily Living In your present state of health, do you have any difficulty performing the following activities: 01/21/2019  Hearing? N  Vision? N  Difficulty concentrating or making decisions? N  Walking or climbing stairs? N  Dressing or bathing? N  Doing errands, shopping? Y  Comment Son provides Copywriter, advertising and eating ? N  Using the Toilet? N  In the past six months, have you accidently leaked urine? N  Do you have problems with loss of bowel control? N  Managing your Medications? N  Managing your Finances? N  Housekeeping or managing your Housekeeping? N  Some recent data might be hidden        Exercise Current Exercise Habits: The patient does not participate in regular exercise at present, Exercise limited by: cardiac condition(s)  Diet Patient reports consuming 3 meals a day and 0 snack(s) a day Patient reports that her primary diet is: Diabetic Patient reports that she does have regular access to food.   Depression Screen PHQ 2/9 Scores 01/21/2019 01/05/2019 10/06/2018 08/14/2018 07/29/2017 07/01/2017 02/10/2017  PHQ - 2 Score 1 2 1 2 2 2 4   PHQ- 9 Score - 3 2 9 8 7 8      Fall Risk Fall Risk  01/21/2019 08/14/2018 07/29/2017 07/01/2017 02/10/2017  Falls in the past year?  0 0 No No Yes  Number  falls in past yr: - - - - 2 or more  Injury with Fall? - - - - No  Risk for fall due to : - - - - -  Follow up Falls prevention discussed - - - -     Objective:  Bridget Martinez seemed alert and oriented and she participated appropriately during our telephone visit.  Blood Pressure Weight BMI  BP Readings from Last 3 Encounters:  10/06/18 126/88  08/14/18 (!) 134/58  05/26/18 (!) 142/68   Wt Readings from Last 3 Encounters:  10/06/18 156 lb (70.8 kg)  08/14/18 153 lb (69.4 kg)  05/26/18 151 lb 3.2 oz (68.6 kg)   BMI Readings from Last 1 Encounters:  10/06/18 26.78 kg/m    *Unable to obtain current vital signs, weight, and BMI due to telephone visit type  Hearing/Vision  . Jan did not seem to have difficulty with hearing/understanding during the telephone conversation . Reports that she has had a formal eye exam by an eye care professional within the past year -10/2018 . Reports that she has not had a formal hearing evaluation within the past year *Unable to fully assess hearing and vision during telephone visit type  Cognitive Function: 6CIT Screen 01/21/2019  What Year? 0 points  What month? 0 points  What time? 0 points  Count back from 20 2 points  Months in reverse 0 points  Repeat phrase 0 points    Normal Cognitive Function Screening: Yes (Normal:0-7, Significant for Dysfunction: >8)  Immunization & Health Maintenance Record Immunization History  Administered Date(s) Administered  . Influenza Split 07/27/2011  . Influenza, High Dose Seasonal PF 08/14/2018  . Influenza,inj,Quad PF,6+ Mos 06/07/2016  . Influenza-Unspecified 06/07/2015  . Pneumococcal Conjugate-13 10/06/2018  . Pneumococcal-Unspecified 06/07/2015  . Tdap 10/06/2018    Health Maintenance  Topic Date Due  . HEMOGLOBIN A1C  07/13/2017  . OPHTHALMOLOGY EXAM  10/02/2017  . FOOT EXAM  07/01/2018  . MAMMOGRAM  01/05/2020 (Originally 05/15/2000)  . DEXA SCAN  01/05/2020 (Originally 05/16/2015)   . Hepatitis C Screening  01/05/2020 (Originally 06-29-1951)  . INFLUENZA VACCINE  04/10/2019  . URINE MICROALBUMIN  08/15/2019  . COLONOSCOPY  11/19/2023  . TETANUS/TDAP  10/06/2028  . PNA vac Low Risk Adult  Completed       Assessment  This is a routine wellness examination for Bridget Martinez.  Health Maintenance: Due or Overdue Health Maintenance Due  Topic Date Due  . HEMOGLOBIN A1C  07/13/2017  . OPHTHALMOLOGY EXAM  10/02/2017  . FOOT EXAM  07/01/2018   Usually has hemoglobin A1c checked at Dr. Cindra Eves office.  Had eye exam 10/2018- records in [place]- will update health maintenance  Recommend foot exam at next visit with Monia Pouch, [place] does not need a referral for Community Assistance: Care Management:   no Social Work:    no Prescription Assistance:  no Nutrition/Diabetes Education:  no   Plan:  Personalized Goals Goals Addressed            This Visit's Progress   . HEMOGLOBIN A1C < 7.0 (pt-stated)       Increase exercise - walk 3 times per week for 30 minutes Get continuous blood glucose monitor.       Personalized Health Maintenance & Screening Recommendations  Screening mammography Bone densitometry screening Advanced directives: has NO advanced directive  - add't info requested. Referral to SW: no  Lung Cancer Screening  Recommended: no (Low Dose CT Chest recommended if Age 15-80 years, 30 pack-year currently smoking OR have quit w/in past 15 years) Hepatitis C Screening recommended: yes   Advanced Directives: Written information was prepared per patient's request.  Referrals & Orders Mammogram scheduled 04/06/2019  Follow-up Plan Follow-up with Bridget Gouty, FNP as planned Work on goal of reducing hemoglobin A1c- this is checked at Dr. Cindra Eves office Attend mammogram appointment.    I have personally reviewed and noted the following in the patient's chart:   . Medical and social history . Use of alcohol, tobacco or  illicit drugs  . Current medications and supplements . Functional ability and status . Nutritional status . Physical activity . Advanced directives . List of other physicians . Hospitalizations, surgeries, and ER visits in previous 12 months . Vitals . Screenings to include cognitive, depression, and falls . Referrals and appointments  In addition, I have reviewed and discussed with Bridget Martinez certain preventive protocols, quality metrics, and best practice recommendations. A written personalized care plan for preventive services as well as general preventive health recommendations is available and can be mailed to the patient at her request.      Nolberto Hanlon, RN  01/21/2019

## 2019-01-28 ENCOUNTER — Encounter: Payer: Self-pay | Admitting: Family Medicine

## 2019-01-28 DIAGNOSIS — E11319 Type 2 diabetes mellitus with unspecified diabetic retinopathy without macular edema: Secondary | ICD-10-CM | POA: Insufficient documentation

## 2019-03-07 ENCOUNTER — Other Ambulatory Visit (HOSPITAL_COMMUNITY): Payer: Self-pay | Admitting: Internal Medicine

## 2019-04-08 ENCOUNTER — Ambulatory Visit: Payer: Medicare Other | Admitting: Family Medicine

## 2019-04-21 LAB — HM MAMMOGRAPHY: HM Mammogram: NORMAL (ref 0–4)

## 2019-06-17 ENCOUNTER — Encounter (HOSPITAL_COMMUNITY): Payer: Medicare Other | Admitting: Internal Medicine

## 2019-06-17 ENCOUNTER — Ambulatory Visit (HOSPITAL_COMMUNITY): Payer: Medicare Other

## 2019-07-08 ENCOUNTER — Ambulatory Visit (HOSPITAL_BASED_OUTPATIENT_CLINIC_OR_DEPARTMENT_OTHER)
Admission: RE | Admit: 2019-07-08 | Discharge: 2019-07-08 | Disposition: A | Discharge status: Home / Self Care | Payer: Medicare Other | Source: Ambulatory Visit

## 2019-07-08 ENCOUNTER — Ambulatory Visit (HOSPITAL_COMMUNITY)
Admission: RE | Admit: 2019-07-08 | Discharge: 2019-07-08 | Disposition: A | Discharge status: Home / Self Care | Payer: Medicare Other | Source: Ambulatory Visit | Attending: Internal Medicine | Admitting: Internal Medicine

## 2019-07-08 ENCOUNTER — Ambulatory Visit (HOSPITAL_COMMUNITY): Admission: RE | Admit: 2019-07-08 | Payer: Medicare Other | Source: Ambulatory Visit

## 2019-07-08 ENCOUNTER — Encounter (HOSPITAL_COMMUNITY): Payer: Self-pay | Admitting: Internal Medicine

## 2019-07-08 ENCOUNTER — Other Ambulatory Visit: Payer: Self-pay

## 2019-07-08 VITALS — BP 136/58 | HR 68 | Wt 159.2 lb

## 2019-07-08 DIAGNOSIS — I130 Hypertensive heart and chronic kidney disease with heart failure and stage 1 through stage 4 chronic kidney disease, or unspecified chronic kidney disease: Secondary | ICD-10-CM | POA: Diagnosis not present

## 2019-07-08 DIAGNOSIS — I255 Ischemic cardiomyopathy: Secondary | ICD-10-CM | POA: Insufficient documentation

## 2019-07-08 DIAGNOSIS — I5022 Chronic systolic (congestive) heart failure: Secondary | ICD-10-CM | POA: Insufficient documentation

## 2019-07-08 DIAGNOSIS — M1990 Unspecified osteoarthritis, unspecified site: Secondary | ICD-10-CM | POA: Diagnosis not present

## 2019-07-08 DIAGNOSIS — F419 Anxiety disorder, unspecified: Secondary | ICD-10-CM | POA: Diagnosis not present

## 2019-07-08 DIAGNOSIS — I252 Old myocardial infarction: Secondary | ICD-10-CM | POA: Diagnosis not present

## 2019-07-08 DIAGNOSIS — I447 Left bundle-branch block, unspecified: Secondary | ICD-10-CM | POA: Insufficient documentation

## 2019-07-08 DIAGNOSIS — Z7982 Long term (current) use of aspirin: Secondary | ICD-10-CM | POA: Diagnosis not present

## 2019-07-08 DIAGNOSIS — I2510 Atherosclerotic heart disease of native coronary artery without angina pectoris: Secondary | ICD-10-CM

## 2019-07-08 DIAGNOSIS — N1831 Chronic kidney disease, stage 3a: Secondary | ICD-10-CM

## 2019-07-08 DIAGNOSIS — Z794 Long term (current) use of insulin: Secondary | ICD-10-CM | POA: Insufficient documentation

## 2019-07-08 DIAGNOSIS — E785 Hyperlipidemia, unspecified: Secondary | ICD-10-CM | POA: Diagnosis not present

## 2019-07-08 DIAGNOSIS — K219 Gastro-esophageal reflux disease without esophagitis: Secondary | ICD-10-CM | POA: Diagnosis not present

## 2019-07-08 DIAGNOSIS — Z79899 Other long term (current) drug therapy: Secondary | ICD-10-CM | POA: Diagnosis not present

## 2019-07-08 DIAGNOSIS — I340 Nonrheumatic mitral (valve) insufficiency: Secondary | ICD-10-CM | POA: Insufficient documentation

## 2019-07-08 DIAGNOSIS — F329 Major depressive disorder, single episode, unspecified: Secondary | ICD-10-CM | POA: Diagnosis not present

## 2019-07-08 DIAGNOSIS — Z79890 Hormone replacement therapy (postmenopausal): Secondary | ICD-10-CM | POA: Insufficient documentation

## 2019-07-08 DIAGNOSIS — E1022 Type 1 diabetes mellitus with diabetic chronic kidney disease: Secondary | ICD-10-CM | POA: Insufficient documentation

## 2019-07-08 DIAGNOSIS — E039 Hypothyroidism, unspecified: Secondary | ICD-10-CM | POA: Diagnosis not present

## 2019-07-08 DIAGNOSIS — N189 Chronic kidney disease, unspecified: Secondary | ICD-10-CM | POA: Diagnosis not present

## 2019-07-08 DIAGNOSIS — Z951 Presence of aortocoronary bypass graft: Secondary | ICD-10-CM | POA: Diagnosis not present

## 2019-07-08 NOTE — Addendum Note (Signed)
Encounter addended by: Valeda Malm, RN on: 07/08/2019 10:54 AM  Actions taken: Clinical Note Signed

## 2019-07-08 NOTE — Patient Instructions (Signed)
No Medication changes today  Your physician recommends that you schedule a follow-up appointment in: 1 year. Our office will call you to schedule this appointment. If you do not get a call by august 2021, please call our office to schedule appointment  At the [facility], you and your health needs are our priority. As part of our continuing mission to provide you with exceptional heart care, we have created designated Provider Care Teams. These Care Teams include your primary Cardiologist (physician) and Advanced Practice Providers (APPs- Physician Assistants and Nurse Practitioners) who all work together to provide you with the care you need, when you need it.   You may see any of the following providers on your designated Care Team at your next follow up: Marland Kitchen Dr Glori Bickers . Dr Loralie Champagne . Darrick Grinder, NP . Lyda Jester, PA   Please be sure to bring in all your medications bottles to every appointment.

## 2019-07-08 NOTE — Progress Notes (Addendum)
[place] VISIT  Patient ID: Bridget Martinez, female   DOB: 1951/01/18, 68 y.o.   MRN: [id number] PCP: Dr Bridget Martinez ([facility]) Endocrinologist: Dr Bridget Martinez  HPI: Bridget Martinez is a 68 yo woman with DM1, HTN, hypothyroidism, CAD s/p CABG x 4 (09/2011), depression and CHF due to ICM with EF 30-35%  Admitted to the hospital 08/24/13 for altered mental status and was found to be severely acidotic and was intubated. Glucose on admission >700, temp 91.8 and BP 72/37. Blood cultures one out of two show coagulase-negative staph, urine cultures showed bacillus species  She has had multiple admissions for gastroparesis and hyperglycemia.  In 2/16, she was admitted with DKA, n/v, gastroparesis, and NSTEMI. Evaluated by Dr Bridget Martinez and not felt to be cath candidate.  She as noted to have intermittent LBBB.    Here for routine f/u. Doing fairly well. Has not been in the hospital lately. Able to do all ADLs without CP or significant SOB. Gets SOB if she has to go up more than a few steps. No edema, orthopnea or PND. Still struggling with high sugars. Follows with Dr. Buddy Martinez and Hgba1c is 10%  Echo today EF 30-35% (stable) mild-mod MR    Echo 05/26/18: EF 30-35% inferior AK mod MR grade II DD Personally reviewed  Echo 01/2016 EF 30-35% inferior AK  ECHOs 11/21/11 EF 35-40% 07/30/12  EF  45-50% Severe hypokinesis of inferior posterior wall 09/21/13 EF 35-40% with grade II DD  10/29/2014 EF 30-35% Grade II DD, mild AI, PA systolic pressure 45 mmHg.   Labs 01/2013: K+ 4.0, Creatinine 1.31, BUN 35        08/2013: K+ 4.4, Cr 0.81        11/30/13: K+ 5.4, glucose 541, creatinine 0.91        11/05/2014: K 3.6 Creatinine 1.66           4/16: K 5.9 => 5.1, creatinine 0.9, HCT 30.9         2/17: K 4.3 creatinine 0.95   Review of systems complete and found to be negative unless listed in HPI.   Past Medical History:  Diagnosis Date  . Arthritis   . CHF (congestive heart failure) ([place])   . Coronary  artery disease    angina.  MI.   . Depression    anxiety  . Diabetes mellitus    on insulin, with h/o DKA   . GERD (gastroesophageal reflux disease)    Hiatal hernia.   Marland Kitchen HTN (hypertension)   . Hyperlipidemia   . Hypertension   . Hypothyroidism   . Left displaced femoral neck fracture ([place]) 11/19/2014  . Myocardial infarction [facility])    NSTEMI 07/2011 with cardiogenic shock, s/p CABG 09/2011  . Osteoporosis   . Pneumonia       . Tuberculosis   . Venous stasis ulcers ([facility])     Current Outpatient Medications  Medication Sig Dispense Refill  . aspirin 81 MG EC tablet Take 1 tablet (81 mg total) by mouth daily. 30 tablet 3  . buPROPion (WELLBUTRIN XL) 150 MG 24 hr tablet Take 1 tablet (150 mg total) by mouth 2 (two) times daily. 180 tablet 3  . carvedilol (COREG) 6.25 MG tablet Take 12.5 mg by mouth 2 (two) times daily with a meal.    . ENTRESTO 97-103 MG TAKE 1 TABLET BY MOUTH TWICE A DAY 180 tablet 2  . escitalopram (LEXAPRO) 20 MG tablet Take 1.5 tablets (30 mg total) by mouth  daily. 135 tablet 1  . furosemide (LASIX) 20 MG tablet Take 1 tablet (20 mg total) by mouth daily as needed. 30 tablet 3  . Insulin Detemir (LEVEMIR FLEXPEN) 100 UNIT/ML Pen Inject 18 Units into the skin at bedtime. 22 in am    . insulin lispro (HUMALOG) 100 UNIT/ML injection Inject 1-22 Units into the skin 3 (three) times daily before meals.    Marland Kitchen levothyroxine (SYNTHROID) 88 MCG tablet Take 1 tablet (88 mcg total) by mouth daily. 90 tablet 3  . metoCLOPramide (REGLAN) 5 MG tablet Take 5 mg by mouth 3 (three) times daily before meals.    . ONE TOUCH ULTRA TEST test strip CHECK BLOOD SUGAR UP TO 3 TIMES DAILY    . pantoprazole (PROTONIX) 40 MG tablet TAKE 1 TABLET BY MOUTH EVERY DAY 90 tablet 3  . potassium chloride SA (KLOR-CON M20) 20 MEQ tablet Take 1 tablet (20 mEq total) by mouth daily as needed. 30 tablet 3  . rosuvastatin (CRESTOR) 40 MG tablet TAKE 1 TABLET BY MOUTH EVERY DAY 90 tablet 3  . sodium  bicarbonate 650 MG tablet Take 650 mg by mouth daily.      No current facility-administered medications for this encounter.     Vitals:   07/08/19 1024  BP: (!) 136/58  Pulse: 68  SpO2: 98%  Weight: 72.2 kg (159 lb 3.2 oz)   Wt Readings from Last 3 Encounters:  07/08/19 72.2 kg (159 lb 3.2 oz)  10/06/18 70.8 kg (156 lb)  08/14/18 69.4 kg (153 lb)    PHYSICAL EXAM:  General:  Well appearing. No resp difficulty HEENT: normal Neck: supple. no JVD. Carotids 2+ bilat; no bruits. No lymphadenopathy or thryomegaly appreciated. Cor: PMI nondisplaced. Regular rate & rhythm. No rubs, gallops or murmurs. Lungs: clear Abdomen: soft, nontender, nondistended. No hepatosplenomegaly. No bruits or masses. Good bowel sounds. Extremities: no cyanosis, clubbing, rash, edema Neuro: alert & orientedx3, cranial nerves grossly intact. moves all 4 extremities w/o difficulty. Affect pleasant  ECG: Probable NSR (diminutive p waves) 61 with iLBBB. Personally reviewed  ASSESSMENT & PLAN:   1) Chronic systolic HF: Ischemic cardiomyopathy.  -Echo 01/2016 EF 35% (stable)RV normal.   - Echo today EF stable at 30-35% Personally reviewed - Stable NYHA II - Volume status looks good.  - Continue lasix 20 mg PRN + 20 meq KCl. Hasn't taken in a long time.  - Continue Coreg 12.5 mg BID. Recently increased by Nephrology - Continue Entresto 97/103 mg BID.   - Off spiro due to hyperkalemia  - Not candidate for SGLT2i due to DM1 - We discussed ICD again and she is not interested - Reinforced the need and importance of daily weights, a low sodium diet, and fluid restriction (less than 2 L a day). Instructed to call the HF clinic if weight increases more than 3 lbs overnight or 5 lbs in a week.  2) HTN:  - Blood pressure well controlled. Continue current regimen. 3) HLD:  - Continue rosuvastatin. Followed by [facility]. Goal LDL < 70. No change.  4) CAD: s/p CABG x 4. No chest pain.  R - No s/s ischemia - Myoview 4/18.  No ischemia - Continue ASA 81 and statin   5) DM, type I: - She is followed by Dr Bridget Martinez. No change.  - With DM1 does not qualify for Jardiance 6) CKD:  - Followed by PCP. 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

## 2019-07-08 NOTE — Progress Notes (Signed)
  Echocardiogram 2D Echocardiogram has been performed.  Bridget Martinez 07/08/2019, 10:31 AM

## 2019-07-08 NOTE — Addendum Note (Signed)
Encounter addended by: Jolaine Artist, MD on: 07/08/2019 10:53 AM  Actions taken: Clinical Note Signed

## 2019-07-13 LAB — ECHOCARDIOGRAM COMPLETE: Weight: 2547.2 oz

## 2019-09-09 DIAGNOSIS — D631 Anemia in chronic kidney disease: Secondary | ICD-10-CM | POA: Insufficient documentation

## 2019-09-15 ENCOUNTER — Other Ambulatory Visit (HOSPITAL_COMMUNITY): Payer: Self-pay | Admitting: Internal Medicine

## 2019-09-22 ENCOUNTER — Other Ambulatory Visit: Payer: Self-pay | Admitting: Family Medicine

## 2019-09-22 DIAGNOSIS — F339 Major depressive disorder, recurrent, unspecified: Secondary | ICD-10-CM

## 2019-10-14 ENCOUNTER — Encounter (INDEPENDENT_AMBULATORY_CARE_PROVIDER_SITE_OTHER): Payer: Medicare Other | Admitting: Ophthalmology

## 2019-10-17 ENCOUNTER — Other Ambulatory Visit (HOSPITAL_COMMUNITY): Payer: Self-pay | Admitting: Internal Medicine

## 2019-10-21 ENCOUNTER — Other Ambulatory Visit: Payer: Self-pay | Admitting: Family Medicine

## 2019-10-21 DIAGNOSIS — F339 Major depressive disorder, recurrent, unspecified: Secondary | ICD-10-CM

## 2019-10-25 ENCOUNTER — Other Ambulatory Visit: Payer: Self-pay | Admitting: Family Medicine

## 2019-10-25 DIAGNOSIS — F339 Major depressive disorder, recurrent, unspecified: Secondary | ICD-10-CM

## 2019-11-11 ENCOUNTER — Ambulatory Visit (INDEPENDENT_AMBULATORY_CARE_PROVIDER_SITE_OTHER): Payer: Medicare HMO | Admitting: Family Medicine

## 2019-11-11 ENCOUNTER — Encounter: Payer: Self-pay | Admitting: Family Medicine

## 2019-11-11 DIAGNOSIS — F339 Major depressive disorder, recurrent, unspecified: Secondary | ICD-10-CM | POA: Diagnosis not present

## 2019-11-11 MED ORDER — ESCITALOPRAM OXALATE 20 MG PO TABS: 30.0000 mg | ORAL_TABLET | Freq: Every day | ORAL | 3 refills | Status: DC

## 2019-11-11 NOTE — Progress Notes (Signed)
Virtual Visit via telephone Note Due to COVID-19 pandemic this visit was conducted virtually. This visit type was conducted due to national recommendations for restrictions regarding the COVID-19 Pandemic (e.g. social distancing, sheltering in place) in an effort to limit this patient's exposure and mitigate transmission in our community. All issues noted in this document were discussed and addressed.  A physical exam was not performed with this format.   I connected with Bridget Martinez on 11/11/2019 at 0905 by telephone and verified that I am speaking with the correct person using two identifiers. Bridget Martinez is currently located at home and no one is currently with them during visit. The provider, Monia Pouch, FNP is located in their office at time of visit.  I discussed the limitations, risks, security and privacy concerns of performing an evaluation and management service by telephone and the availability of in person appointments. I also discussed with the patient that there may be a patient responsible charge related to this service. The patient expressed understanding and agreed to proceed.  Subjective:  Patient ID: Bridget Martinez, female    DOB: Jan 04, 1951, 69 y.o.   MRN: [id number]  Chief Complaint:  Depression   HPI: Bridget Martinez is a 69 y.o. female presenting on 11/11/2019 for Depression   Pt following up today for a refill of her Lexapro. States she is doing great with the medications without associated side effects.   Depression        This is a chronic problem.  The current episode started more than 1 year ago.   The problem has been waxing and waning since onset.  Associated symptoms include sad.  Associated symptoms include no decreased concentration, no fatigue, no helplessness, no hopelessness, does not have insomnia, not irritable, no restlessness, no decreased interest, no appetite change, no body aches, no myalgias, no headaches, no indigestion and no suicidal  ideas.  Past treatments include SSRIs - Selective serotonin reuptake inhibitors and SNRIs - Serotonin and norepinephrine reuptake inhibitors.  Compliance with treatment is good.  Previous treatment provided significant relief.  Depression screen Bridget Martinez 2/9 11/11/2019 01/21/2019 01/05/2019 10/06/2018 08/14/2018  Decreased Interest 0 0 1 1 1   Down, Depressed, Hopeless 1 1 1  0 1  PHQ - 2 Score 1 1 2 1 2   Altered sleeping 0 - 0 0 3  Tired, decreased energy 0 - 1 1 2   Change in appetite 0 - 0 0 0  Feeling bad or failure about yourself  0 - 0 0 2  Trouble concentrating 0 - 0 0 0  Moving slowly or fidgety/restless 0 - 0 0 0  Suicidal thoughts 0 - - 0 0  PHQ-9 Score 1 - 3 2 9   Difficult doing work/chores - - - Not difficult at all Somewhat difficult  Some recent data might be hidden     Relevant past medical, surgical, family, and social history reviewed and updated as indicated.  Allergies and medications reviewed and updated.   Past Medical History:  Diagnosis Date  . Arthritis   . CHF (congestive heart failure) ([place])   . Coronary artery disease    angina.  MI.   . Depression    anxiety  . Diabetes mellitus    on insulin, with h/o DKA   . GERD (gastroesophageal reflux disease)    Hiatal hernia.   Marland Kitchen HTN (hypertension)   . Hyperlipidemia   . Hypertension   . Hypothyroidism   . Left displaced femoral neck fracture ([place])  11/19/2014  . Myocardial infarction [facility])    NSTEMI 07/2011 with cardiogenic shock, s/p CABG 09/2011  . Osteoporosis   . Pneumonia       . Tuberculosis   . Venous stasis ulcers ([facility])     Past Surgical History:  Procedure Laterality Date  . CARDIAC CATHETERIZATION    . COLONOSCOPY N/A 11/18/2013   Procedure: COLONOSCOPY;  Surgeon: Lafayette Dragon, MD;  Location: [facility] ENDOSCOPY;  Service: Endoscopy;  Laterality: N/A;  . CORONARY ARTERY BYPASS GRAFT     Dr. Prescott Gum in 09/2011   . CORONARY ARTERY BYPASS GRAFT  2012  . ESOPHAGOGASTRODUODENOSCOPY N/A 11/17/2013   Procedure:  ESOPHAGOGASTRODUODENOSCOPY (EGD);  Surgeon: Lafayette Dragon, MD;  Location[facility] ENDOSCOPY;  Service: Endoscopy;  Laterality: N/A;  . LEFT HEART CATHETERIZATION WITH CORONARY ANGIOGRAM N/A 07/26/2011   Procedure: LEFT HEART CATHETERIZATION WITH CORONARY ANGIOGRAM;  Surgeon: Larey Dresser, MD;  Location: [facility] CATH LAB;  Service: Cardiovascular;  Laterality: N/A;  . RIGHT HEART CATHETERIZATION N/A 07/29/2011   Procedure: RIGHT HEART CATH;  Surgeon: Minus Breeding, MD;  Location[facility] LAB;  Service: Cardiovascular;  Laterality: N/A;  . TONSILLECTOMY    . TOTAL HIP ARTHROPLASTY Left 11/21/2014   Procedure: TOTAL HIP ARTHROPLASTY ANTERIOR APPROACH;  Surgeon: Mcarthur Rossetti, MD;  Location: [facility];  Service: Orthopedics;  Laterality: Left;    Social History   Socioeconomic History  . Marital status: Married    Spouse name: Bridget Martinez [phone]; Son Bridget Martinez  . Number of children: 2  . Years of education: 44  . Highest education level: Bachelor's degree (e.g., BA, AB, BS)  Occupational History  . Not on file  Tobacco Use  . Smoking status: Never Smoker  . Smokeless tobacco: Never Used  Substance and Sexual Activity  . Alcohol use: No  . Drug use: No  . Sexual activity: Not on file  Other Topics Concern  . Not on file  Social History Narrative   ** Merged History Encounter **       Bridget Martinez (Husband): [phone] [facility]) [phone] (Cell)   Social Determinants of Health   Financial Resource Strain: Low Risk   . Difficulty of Paying Living Expenses: Not hard at all  Food Insecurity: No Food Insecurity  . Worried About Charity fundraiser in the Last Year: Never true  . Ran Out of Food in the Last Year: Never true  Transportation Needs: No Transportation Needs  . Lack of Transportation (Medical): No  . Lack of Transportation (Non-Medical): No  Physical Activity: Inactive  . Days of Exercise per Week: 0 days  . Minutes of Exercise per Session: 0 min  Stress: Stress Concern Present   . Feeling of Stress : To some extent  Social Connections: Slightly Isolated  . Frequency of Communication with Friends and Family: More than three times a week  . Frequency of Social Gatherings with Friends and Family: More than three times a week  . Attends Religious Services: More than 4 times per year  . Active Member of Clubs or Organizations: No  . Attends Archivist Meetings: Never  . Marital Status: Married  Human resources officer Violence: Not At Risk  . Fear of Current or Ex-Partner: No  . Emotionally Abused: No  . Physically Abused: No  . Sexually Abused: No    Outpatient Encounter Medications as of 11/11/2019  Medication Sig  . aspirin 81 MG EC tablet Take 1 tablet (81 mg total) by mouth daily.  Marland Kitchen  buPROPion (WELLBUTRIN XL) 150 MG 24 hr tablet Take 1 tablet (150 mg total) by mouth 2 (two) times daily.  . carvedilol (COREG) 6.25 MG tablet TAKE 1 TABLET (6.25 MG TOTAL) BY MOUTH 2 TIMES DAILY WITH A MEAL. CALL FOR OFFICE VISIT [phone]  . ENTRESTO 97-103 MG TAKE 1 TABLET BY MOUTH TWICE A DAY  . escitalopram (LEXAPRO) 20 MG tablet Take 1.5 tablets (30 mg total) by mouth daily. (Needs to be seen before next refill)  . furosemide (LASIX) 20 MG tablet Take 1 tablet (20 mg total) by mouth daily as needed.  . Insulin Detemir (LEVEMIR FLEXPEN) 100 UNIT/ML Pen Inject 18 Units into the skin at bedtime. 22 in am  . insulin lispro (HUMALOG) 100 UNIT/ML injection Inject 1-22 Units into the skin 3 (three) times daily before meals.  Marland Kitchen levothyroxine (SYNTHROID) 88 MCG tablet Take 1 tablet (88 mcg total) by mouth daily.  . metoCLOPramide (REGLAN) 5 MG tablet Take 5 mg by mouth 3 (three) times daily before meals.  . ONE TOUCH ULTRA TEST test strip CHECK BLOOD SUGAR UP TO 3 TIMES DAILY  . pantoprazole (PROTONIX) 40 MG tablet TAKE 1 TABLET BY MOUTH EVERY DAY  . potassium chloride SA (KLOR-CON M20) 20 MEQ tablet Take 1 tablet (20 mEq total) by mouth daily as needed.  . rosuvastatin (CRESTOR)  40 MG tablet TAKE 1 TABLET BY MOUTH EVERY DAY  . [DISCONTINUED] escitalopram (LEXAPRO) 20 MG tablet Take 1.5 tablets (30 mg total) by mouth daily. (Needs to be seen before next refill)   No facility-administered encounter medications on file as of 11/11/2019.    Allergies  Allergen Reactions  . Penicillins Anaphylaxis and Hives    Tolerates zosyn Has patient had a PCN reaction causing immediate rash, facial/tongue/throat swelling, SOB or lightheadedness with hypotension: Yes Has patient had a PCN reaction causing severe rash involving mucus membranes or skin necrosis: No Has patient had a PCN reaction that required hospitalization: Yes Has patient had a PCN reaction occurring within the last 10 years: No If all of the above answers are "NO", then may proceed with Cephalosporin use.   . Sulfa Antibiotics Anaphylaxis and Swelling    Stiff neck also   . Macrobid [employer] Macro] Other (See Comments)    Skin peels off  . Ciprofloxacin Hcl Other (See Comments)    Skin peeling    Review of Systems  Constitutional: Negative for activity change, appetite change, chills, diaphoresis, fatigue, fever and unexpected weight change.  HENT: Negative.   Eyes: Negative.   Respiratory: Negative for cough, chest tightness and shortness of breath.   Cardiovascular: Negative for chest pain, palpitations and leg swelling.  Gastrointestinal: Negative for abdominal pain, blood in stool, constipation, diarrhea, nausea and vomiting.  Endocrine: Negative.   Genitourinary: Negative for decreased urine volume, difficulty urinating, dysuria, frequency and urgency.  Musculoskeletal: Negative for arthralgias and myalgias.  Skin: Negative.   Allergic/Immunologic: Negative.   Neurological: Negative for dizziness and headaches.  Hematological: Negative.   Psychiatric/Behavioral: Positive for depression and dysphoric mood. Negative for agitation, behavioral problems, confusion, decreased concentration,  hallucinations, self-injury, sleep disturbance and suicidal ideas. The patient is not nervous/anxious, does not have insomnia and is not hyperactive.   All other systems reviewed and are negative.        Observations/Objective: No vital signs or physical exam, this was a telephone or virtual health encounter.  Pt alert and oriented, answers all questions appropriately, and able to speak in full sentences.  Assessment and Plan: Monalisa was seen today for depression.  Diagnoses and all orders for this visit:  Recurrent depression ([place]) Doing well on below. Will continue.  -     escitalopram (LEXAPRO) 20 MG tablet; Take 1.5 tablets (30 mg total) by mouth daily. (Needs to be seen before next refill)     Follow Up Instructions: Return in about 3 months (around 02/11/2020), or if symptoms worsen or fail to improve.    I discussed the assessment and treatment plan with the patient. The patient was provided an opportunity to ask questions and all were answered. The patient agreed with the plan and demonstrated an understanding of the instructions.   The patient was advised to call back or seek an in-person evaluation if the symptoms worsen or if the condition fails to improve as anticipated.  The above assessment and management plan was discussed with the patient. The patient verbalized understanding of and has agreed to the management plan. Patient is aware to call the clinic if they develop any new symptoms or if symptoms persist or worsen. Patient is aware when to return to the clinic for a follow-up visit. Patient educated on when it is appropriate to go to the emergency department.    I provided 15 minutes of non-face-to-face time during this encounter. The call started at 0905. The call ended at 0920. The other time was used for coordination of care.    Monia Pouch, FNP-C [facility] [address] [place], [place] [address] [phone] 11/11/2019

## 2019-11-24 ENCOUNTER — Telehealth: Payer: Self-pay

## 2019-11-24 NOTE — Telephone Encounter (Signed)
Request for PA done over the phone for Escitalopram 20mg . Reference number is [id number] and number to call back is [phone]. Awaiting response

## 2019-11-25 NOTE — Telephone Encounter (Signed)
Prior Auth for Escitalopram 20 mg-APPROVED till 09/08/20  Pharmacy notified.

## 2020-01-12 ENCOUNTER — Other Ambulatory Visit: Payer: Self-pay | Admitting: *Deleted

## 2020-01-12 DIAGNOSIS — E785 Hyperlipidemia, unspecified: Secondary | ICD-10-CM

## 2020-01-12 DIAGNOSIS — E1069 Type 1 diabetes mellitus with other specified complication: Secondary | ICD-10-CM

## 2020-01-12 DIAGNOSIS — F339 Major depressive disorder, recurrent, unspecified: Secondary | ICD-10-CM

## 2020-01-12 MED ORDER — BUPROPION HCL ER (XL) 150 MG PO TB24: 150.0000 mg | ORAL_TABLET | Freq: Two times a day (BID) | ORAL | 0 refills | Status: DC

## 2020-01-12 MED ORDER — ROSUVASTATIN CALCIUM 40 MG PO TABS: ORAL_TABLET | ORAL | 0 refills | Status: DC

## 2020-02-03 ENCOUNTER — Other Ambulatory Visit: Payer: Self-pay | Admitting: Family Medicine

## 2020-02-03 DIAGNOSIS — F339 Major depressive disorder, recurrent, unspecified: Secondary | ICD-10-CM

## 2020-02-04 ENCOUNTER — Other Ambulatory Visit: Payer: Self-pay

## 2020-02-04 DIAGNOSIS — E785 Hyperlipidemia, unspecified: Secondary | ICD-10-CM

## 2020-02-04 MED ORDER — ROSUVASTATIN CALCIUM 40 MG PO TABS: ORAL_TABLET | ORAL | 0 refills | Status: DC

## 2020-02-11 ENCOUNTER — Other Ambulatory Visit: Payer: Self-pay | Admitting: Family Medicine

## 2020-02-11 DIAGNOSIS — E785 Hyperlipidemia, unspecified: Secondary | ICD-10-CM

## 2020-02-11 NOTE — Telephone Encounter (Signed)
Former [name] NTBS 30 days given 02/04/20

## 2020-02-11 NOTE — Telephone Encounter (Signed)
Attempted to contact patient - NA °

## 2020-02-14 ENCOUNTER — Other Ambulatory Visit: Payer: Self-pay | Admitting: Family Medicine

## 2020-02-14 DIAGNOSIS — F339 Major depressive disorder, recurrent, unspecified: Secondary | ICD-10-CM

## 2020-02-14 NOTE — Telephone Encounter (Signed)
APPOINTMENT SCHEDULED

## 2020-02-16 ENCOUNTER — Ambulatory Visit (INDEPENDENT_AMBULATORY_CARE_PROVIDER_SITE_OTHER): Payer: Medicare HMO | Admitting: *Deleted

## 2020-02-16 VITALS — Ht 64.0 in | Wt 159.2 lb

## 2020-02-16 DIAGNOSIS — Z0000 Encounter for general adult medical examination without abnormal findings: Secondary | ICD-10-CM

## 2020-02-16 NOTE — Progress Notes (Signed)
MEDICARE ANNUAL WELLNESS VISIT  02/16/2020  Telephone Visit Disclaimer This Medicare AWV was conducted by telephone due to national recommendations for restrictions regarding the COVID-19 Pandemic (e.g. social distancing).  I verified, using two identifiers, that I am speaking with Bridget Martinez or their authorized healthcare agent. I discussed the limitations, risks, security, and privacy concerns of performing an evaluation and management service by telephone and the potential availability of an in-person appointment in the future. The patient expressed understanding and agreed to proceed.   Subjective:  Bridget Martinez is a 69 y.o. female patient of Loman Brooklyn, FNP who had a Medicare Annual Wellness Visit today via telephone. Smrithi is Retired and lives alone. she has 2 children. she reports that she is socially active and does interact with friends/family regularly. she is not physically active.  Patient Care Team: Loman Brooklyn, FNP as PCP - General (Family Medicine) [name], Shaune Pascal, MD (Cardiology) Delrae Rend, MD as Attending Physician (Internal Medicine)  Advanced Directives 02/16/2020 01/21/2019 03/27/2018 03/06/2015 02/03/2015 11/19/2014 10/26/2014  Does Patient Have a Medical Advance Directive? Yes No No No No No No  Type of Advance Directive Living will;Healthcare Power of Attorney - - - - - -  Does patient want to make changes to medical advance directive? No - Patient declined - - - - - -  Copy of [place] in Chart? No - copy requested - - - - - -  Would patient like information on creating a medical advance directive? No - Patient declined Yes (MAU/Ambulatory/Procedural Areas - Information given) Yes (ED - Information included in AVS) No - patient declined information No - patient declined information No - patient declined information No - patient declined information  Pre-existing out of facility DNR order (yellow form or pink MOST form) - -  - - - - -    Hospital Utilization Over the Past 12 Months: # of hospitalizations or ER visits: 0 # of surgeries: 0  Review of Systems    Patient reports that her overall health is unchanged compared to last year.  History obtained from chart review and the patient General ROS: negative Psychological ROS: positive for - depression  Patient Reported Readings (BP, Pulse, CBG, Weight, etc) CBG- 118  Pain Assessment Pain : No/denies pain     Current Medications & Allergies (verified) Allergies as of 02/16/2020      Reactions   Penicillins Anaphylaxis, Hives   Tolerates zosyn Has patient had a PCN reaction causing immediate rash, facial/tongue/throat swelling, SOB or lightheadedness with hypotension: Yes Has patient had a PCN reaction causing severe rash involving mucus membranes or skin necrosis: No Has patient had a PCN reaction that required hospitalization: Yes Has patient had a PCN reaction occurring within the last 10 years: No If all of the above answers are "NO", then may proceed with Cephalosporin use.   Sulfa Antibiotics Anaphylaxis, Swelling   Stiff neck also    Macrobid [nitrofurantoin Monohyd Macro] Other (See Comments)   Skin peels off   Ciprofloxacin Hcl Other (See Comments)   Skin peeling      Medication List       Accurate as of February 16, 2020  8:48 AM. If you have any questions, ask your nurse or doctor.        STOP taking these medications   furosemide 20 MG tablet Commonly known as: LASIX     TAKE these medications   aspirin 81 MG EC tablet  Take 1 tablet (81 mg total) by mouth daily.   buPROPion 150 MG 24 hr tablet Commonly known as: WELLBUTRIN XL TAKE 1 TABLET BY MOUTH TWICE A DAY   carvedilol 6.25 MG tablet Commonly known as: COREG TAKE 1 TABLET (6.25 MG TOTAL) BY MOUTH 2 TIMES DAILY WITH A MEAL. CALL FOR OFFICE VISIT [phone]   Entresto 97-103 MG Generic drug: sacubitril-valsartan TAKE 1 TABLET BY MOUTH TWICE A DAY   escitalopram 20  MG tablet Commonly known as: LEXAPRO Take 1.5 tablets (30 mg total) by mouth daily. (Needs to be seen before next refill)   HumaLOG 100 UNIT/ML injection Generic drug: insulin lispro Inject 1-22 Units into the skin 3 (three) times daily before meals.   Levemir FlexPen 100 UNIT/ML FlexPen Generic drug: insulin detemir Inject 18 Units into the skin at bedtime. 22 in am   levothyroxine 88 MCG tablet Commonly known as: SYNTHROID Take 1 tablet (88 mcg total) by mouth daily.   metoCLOPramide 5 MG tablet Commonly known as: REGLAN Take 5 mg by mouth 3 (three) times daily before meals.   ONE TOUCH ULTRA TEST test strip Generic drug: glucose blood CHECK BLOOD SUGAR UP TO 3 TIMES DAILY   pantoprazole 40 MG tablet Commonly known as: PROTONIX TAKE 1 TABLET BY MOUTH EVERY DAY   potassium chloride SA 20 MEQ tablet Commonly known as: Klor-Con M20 Take 1 tablet (20 mEq total) by mouth daily as needed.   rosuvastatin 40 MG tablet Commonly known as: CRESTOR TAKE 1 TABLET BY MOUTH EVERY DAY (Needs to be seen before next refill)       History (reviewed): Past Medical History:  Diagnosis Date  . Arthritis   . CHF (congestive heart failure) ([place])   . Coronary artery disease    angina.  MI.   . Depression    anxiety  . Diabetes mellitus    on insulin, with h/o DKA   . GERD (gastroesophageal reflux disease)    Hiatal hernia.   Marland Kitchen HTN (hypertension)   . Hyperlipidemia   . Hypertension   . Hypothyroidism   . Left displaced femoral neck fracture ([place]) 11/19/2014  . Myocardial infarction [facility])    NSTEMI 07/2011 with cardiogenic shock, s/p CABG 09/2011  . Osteoporosis   . Pneumonia       . Tuberculosis   . Venous stasis ulcers ([facility])    Past Surgical History:  Procedure Laterality Date  . CARDIAC CATHETERIZATION    . COLONOSCOPY N/A 11/18/2013   Procedure: COLONOSCOPY;  Surgeon: Lafayette Dragon, MD;  Location[facility] ENDOSCOPY;  Service: Endoscopy;  Laterality: N/A;  . CORONARY ARTERY  BYPASS GRAFT     Dr. Prescott Gum in 09/2011   . CORONARY ARTERY BYPASS GRAFT  2012  . ESOPHAGOGASTRODUODENOSCOPY N/A 11/17/2013   Procedure: ESOPHAGOGASTRODUODENOSCOPY (EGD);  Surgeon: Lafayette Dragon, MD;  Location: [facility] ENDOSCOPY;  Service: Endoscopy;  Laterality: N/A;  . LEFT HEART CATHETERIZATION WITH CORONARY ANGIOGRAM N/A 07/26/2011   Procedure: LEFT HEART CATHETERIZATION WITH CORONARY ANGIOGRAM;  Surgeon: Larey Dresser, MD;  Location: [facility] CATH LAB;  Service: Cardiovascular;  Laterality: N/A;  . RIGHT HEART CATHETERIZATION N/A 07/29/2011   Procedure: RIGHT HEART CATH;  Surgeon: Minus Breeding, MD;  Location[facility] CATH LAB;  Service: Cardiovascular;  Laterality: N/A;  . TONSILLECTOMY    . TOTAL HIP ARTHROPLASTY Left 11/21/2014   Procedure: TOTAL HIP ARTHROPLASTY ANTERIOR APPROACH;  Surgeon: Mcarthur Rossetti, MD;  Location: [facility];  Service: Orthopedics;  Laterality: Left;  Family History  Problem Relation Age of Onset  . Heart disease Father   . Multiple sclerosis Father   . Hypertension Mother   . Hyperthyroidism Mother   . Diabetes Mother   . Heart attack Paternal Grandfather   . Heart disease Paternal Grandfather   . Diabetes Cousin        Multiple maternal cousins with type 2 diabetes mellitus  . Diabetes Maternal Uncle        Type 1 diabetes mellitus  . Healthy Daughter   . Asthma Son   . Diabetes Son    Social History   Socioeconomic History  . Marital status: Married    Spouse name: Grayland Ormond [phone]; Son Rush Landmark  . Number of children: 2  . Years of education: 40  . Highest education level: Bachelor's degree (e.g., BA, AB, BS)  Occupational History  . Occupation: Retired  Tobacco Use  . Smoking status: Never Smoker  . Smokeless tobacco: Never Used  Substance and Sexual Activity  . Alcohol use: No  . Drug use: No  . Sexual activity: Not on file  Other Topics Concern  . Not on file  Social History Narrative   ** Merged History Encounter **       Kahmya Pinkham  (Husband): [phone] [facility]) [phone] (Cell)   Social Determinants of Health   Financial Resource Strain: Low Risk   . Difficulty of Paying Living Expenses: Not hard at all  Food Insecurity: No Food Insecurity  . Worried About Charity fundraiser in the Last Year: Never true  . Ran Out of Food in the Last Year: Never true  Transportation Needs: Unmet Transportation Needs  . Lack of Transportation (Medical): Yes  . Lack of Transportation (Non-Medical): Yes  Physical Activity: Inactive  . Days of Exercise per Week: 0 days  . Minutes of Exercise per Session: 0 min  Stress: No Stress Concern Present  . Feeling of Stress : Not at all  Social Connections: Not Isolated  . Frequency of Communication with Friends and Family: More than three times a week  . Frequency of Social Gatherings with Friends and Family: More than three times a week  . Attends Religious Services: More than 4 times per year  . Active Member of Clubs or Organizations: Yes  . Attends Archivist Meetings: More than 4 times per year  . Marital Status: Married    Activities of Daily Living In your present state of health, do you have any difficulty performing the following activities: 02/16/2020  Hearing? N  Vision? N  Difficulty concentrating or making decisions? N  Walking or climbing stairs? Y  Dressing or bathing? N  Doing errands, shopping? Y  Comment Son take patient to Appt and Errands  Preparing Food and eating ? N  Using the Toilet? N  In the past six months, have you accidently leaked urine? N  Do you have problems with loss of bowel control? N  Managing your Medications? N  Managing your Finances? N  Housekeeping or managing your Housekeeping? N  Some recent data might be hidden    Patient Education/ Literacy How often do you need to have someone help you when you read instructions, pamphlets, or other written materials from your doctor or pharmacy?: 1 - Never What is the last grade  level you completed in school?: 2 Bachelors of Arts  Exercise Current Exercise Habits: The patient does not participate in regular exercise at present, Exercise limited by: None identified  Diet  Patient reports consuming 3 meals a day and 0 snack(s) a day Patient reports that her primary diet is: Diabetic Patient reports that she does have regular access to food.   Depression Screen PHQ 2/9 Scores 02/16/2020 02/16/2020 11/11/2019 01/21/2019 01/05/2019 10/06/2018 08/14/2018  PHQ - 2 Score 1 0 1 1 2 1 2   PHQ- 9 Score - - 1 - 3 2 9      Fall Risk Fall Risk  02/16/2020 01/21/2019 08/14/2018 07/29/2017 07/01/2017  Falls in the past year? 1 0 0 No No  Number falls in past yr: 0 - - - -  Injury with Fall? 0 - - - -  Risk for fall due to : History of fall(s) - - - -  Follow up Falls evaluation completed Falls prevention discussed - - -     Objective:  Bridget Martinez seemed alert and oriented and she participated appropriately during our telephone visit.  Blood Pressure Weight BMI  BP Readings from Last 3 Encounters:  07/08/19 (!) 136/58  10/06/18 126/88  08/14/18 (!) 134/58   Wt Readings from Last 3 Encounters:  02/16/20 159 lb 2.8 oz (72.2 kg)  07/08/19 159 lb 3.2 oz (72.2 kg)  10/06/18 156 lb (70.8 kg)   BMI Readings from Last 1 Encounters:  02/16/20 27.32 kg/m    *Unable to obtain current vital signs, weight, and BMI due to telephone visit type  Hearing/Vision  . Hephzibah did not seem to have difficulty with hearing/understanding during the telephone conversation . Reports that she has not had a formal eye exam by an eye care professional within the past year . Reports that she has not had a formal hearing evaluation within the past year *Unable to fully assess hearing and vision during telephone visit type  Cognitive Function: 6CIT Screen 02/16/2020 01/21/2019  What Year? 0 points 0 points  What month? 0 points 0 points  What time? 0 points 0 points  Count back from 20 0 points 2  points  Months in reverse 0 points 0 points  Repeat phrase 2 points 0 points  Total Score 2 -   (Normal:0-7, Significant for Dysfunction: >8)  Normal Cognitive Function Screening: Yes   Immunization & Health Maintenance Record Immunization History  Administered Date(s) Administered  . Influenza Split 07/27/2011  . Influenza, High Dose Seasonal PF 08/14/2018  . Influenza,inj,Quad PF,6+ Mos 06/07/2016  . Influenza-Unspecified 06/07/2015  . Pneumococcal Conjugate-13 10/06/2018  . Pneumococcal-Unspecified 06/07/2015  . Tdap 10/06/2018    Health Maintenance  Topic Date Due  . Hepatitis C Screening  Never done  . COVID-19 Vaccine (1) Never done  . MAMMOGRAM  Never done  . DEXA SCAN  Never done  . HEMOGLOBIN A1C  07/13/2017  . FOOT EXAM  07/01/2018  . URINE MICROALBUMIN  08/15/2019  . OPHTHALMOLOGY EXAM  10/14/2019  . INFLUENZA VACCINE  04/09/2020  . COLONOSCOPY  11/19/2023  . TETANUS/TDAP  10/06/2028  . PNA vac Low Risk Adult  Completed       Assessment  This is a routine wellness examination for Bridget Martinez.  Health Maintenance: Due or Overdue Health Maintenance Due  Topic Date Due  . Hepatitis C Screening  Never done  . COVID-19 Vaccine (1) Never done  . MAMMOGRAM  Never done  . DEXA SCAN  Never done  . HEMOGLOBIN A1C  07/13/2017  . FOOT EXAM  07/01/2018  . URINE MICROALBUMIN  08/15/2019  . OPHTHALMOLOGY EXAM  10/14/2019    Bridget Martinez does  not need a referral for Community Assistance: Care Management:   no Social Work:    no Prescription Assistance:  no Nutrition/Diabetes Education:  no   Plan:  Personalized Goals Goals Addressed            This Visit's Progress   . Exercise 3x per week (30 min per time)       02/16/2020 AWV Goal: Exercise for General Health   Patient will verbalize understanding of the benefits of increased physical activity:  Exercising regularly is important. It will improve your overall fitness, flexibility, and  endurance.  Regular exercise also will improve your overall health. It can help you control your weight, reduce stress, and improve your bone density.  Over the next year, patient will increase physical activity as tolerated with a goal of at least 150 minutes of moderate physical activity per week.   You can tell that you are exercising at a moderate intensity if your heart starts beating faster and you start breathing faster but can still hold a conversation.  Moderate-intensity exercise ideas include:  Walking 1 mile (1.6 km) in about 15 minutes  Biking  Hiking  Golfing  Dancing  Water aerobics  Patient will verbalize understanding of everyday activities that increase physical activity by providing examples like the following: ? Yard work, such as: ? Pushing a Conservation officer, nature ? Raking and bagging leaves ? Washing your car ? Pushing a stroller ? Shoveling snow ? Gardening ? Washing windows or floors  Patient will be able to explain general safety guidelines for exercising:   Before you start a new exercise program, talk with your health care provider.  Do not exercise so much that you hurt yourself, feel dizzy, or get very short of breath.  Wear comfortable clothes and wear shoes with good support.  Drink plenty of water while you exercise to prevent dehydration or heat stroke.  Work out until your breathing and your heartbeat get faster.       Personalized Health Maintenance & Screening Recommendations  Screening mammography Bone densitometry screening Diabetes screening Glaucoma screening Advanced directives: has an advanced directive - a copy HAS NOT been provided.  Lung Cancer Screening Recommended: no (Low Dose CT Chest recommended if Age 31-80 years, 30 pack-year currently smoking OR have quit w/in past 15 years) Hepatitis C Screening recommended: yes HIV Screening recommended: no  Advanced Directives: Written information was not prepared per patient's  request.  Referrals & Orders No orders of the defined types were placed in this encounter.   Follow-up Plan . Follow-up with Loman Brooklyn, FNP as planned . Try to Exercise for at least 30 minutes, 3 times weekly   I have personally reviewed and noted the following in the patient's chart:   . Medical and social history . Use of alcohol, tobacco or illicit drugs  . Current medications and supplements . Functional ability and status . Nutritional status . Physical activity . Advanced directives . List of other physicians . Hospitalizations, surgeries, and ER visits in previous 12 months . Vitals . Screenings to include cognitive, depression, and falls . Referrals and appointments  In addition, I have reviewed and discussed with Bridget Martinez certain preventive protocols, quality metrics, and best practice recommendations. A written personalized care plan for preventive services as well as general preventive health recommendations is available and can be mailed to the patient at her request.      Wardell Heath, LPN  12/10/3534     AVS  printed and Mailed to patient

## 2020-02-16 NOTE — Patient Instructions (Signed)
[place] Maintenance Summary and Written Plan of Care  [name] ,  Thank you for allowing me to perform your Medicare Annual Wellness Visit and for your ongoing commitment to your health.   Health Maintenance & Immunization History Health Maintenance  Topic Date Due  . Hepatitis C Screening  Never done  . COVID-19 Vaccine (1) Never done  . MAMMOGRAM  Never done  . DEXA SCAN  Never done  . HEMOGLOBIN A1C  07/13/2017  . FOOT EXAM  07/01/2018  . URINE MICROALBUMIN  08/15/2019  . OPHTHALMOLOGY EXAM  10/14/2019  . INFLUENZA VACCINE  04/09/2020  . COLONOSCOPY  11/19/2023  . TETANUS/TDAP  10/06/2028  . PNA vac Low Risk Adult  Completed   Immunization History  Administered Date(s) Administered  . Influenza Split 07/27/2011  . Influenza, High Dose Seasonal PF 08/14/2018  . Influenza,inj,Quad PF,6+ Mos 06/07/2016  . Influenza-Unspecified 06/07/2015  . Pneumococcal Conjugate-13 10/06/2018  . Pneumococcal-Unspecified 06/07/2015  . Tdap 10/06/2018    These are the patient goals that we discussed: Goals Addressed            This Visit's Progress   . Exercise 3x per week (30 min per time)       02/16/2020 AWV Goal: Exercise for General Health   Patient will verbalize understanding of the benefits of increased physical activity:  Exercising regularly is important. It will improve your overall fitness, flexibility, and endurance.  Regular exercise also will improve your overall health. It can help you control your weight, reduce stress, and improve your bone density.  Over the next year, patient will increase physical activity as tolerated with a goal of at least 150 minutes of moderate physical activity per week.   You can tell that you are exercising at a moderate intensity if your heart starts beating faster and you start breathing faster but can still hold a conversation.  Moderate-intensity exercise ideas include:  Walking 1 mile (1.6 km)  in about 15 minutes  Biking  Hiking  Golfing  Dancing  Water aerobics  Patient will verbalize understanding of everyday activities that increase physical activity by providing examples like the following: ? Yard work, such as: ? Pushing a Conservation officer, nature ? Raking and bagging leaves ? Washing your car ? Pushing a stroller ? Shoveling snow ? Gardening ? Washing windows or floors  Patient will be able to explain general safety guidelines for exercising:   Before you start a new exercise program, talk with your health care provider.  Do not exercise so much that you hurt yourself, feel dizzy, or get very short of breath.  Wear comfortable clothes and wear shoes with good support.  Drink plenty of water while you exercise to prevent dehydration or heat stroke.  Work out until your breathing and your heartbeat get faster.         This is a list of Health Maintenance Items that are overdue or due now: Health Maintenance Due  Topic Date Due  . Hepatitis C Screening  Never done  . COVID-19 Vaccine (1) Never done  . MAMMOGRAM  Never done  . DEXA SCAN  Never done  . HEMOGLOBIN A1C  07/13/2017  . FOOT EXAM  07/01/2018  . URINE MICROALBUMIN  08/15/2019  . OPHTHALMOLOGY EXAM  10/14/2019     Orders/Referrals Placed Today: No orders of the defined types were placed in this encounter.  (Contact our referral department at [phone] if you have not spoken with someone about  your referral appointment within the next 5 days)    Follow-up Plan Follow up with Hendricks Limes, FNP as scheduled Bring in copy of Advance Directive to be scanned into Chart Try to exercise for at least 30 minutes, 3 times weekly.

## 2020-02-18 ENCOUNTER — Other Ambulatory Visit: Payer: Self-pay | Admitting: Family Medicine

## 2020-02-18 DIAGNOSIS — F339 Major depressive disorder, recurrent, unspecified: Secondary | ICD-10-CM

## 2020-02-18 NOTE — Telephone Encounter (Signed)
Ov 03/02/20

## 2020-02-28 ENCOUNTER — Other Ambulatory Visit: Payer: Self-pay | Admitting: Family Medicine

## 2020-02-28 DIAGNOSIS — E1043 Type 1 diabetes mellitus with diabetic autonomic (poly)neuropathy: Secondary | ICD-10-CM

## 2020-02-28 DIAGNOSIS — F339 Major depressive disorder, recurrent, unspecified: Secondary | ICD-10-CM

## 2020-02-29 ENCOUNTER — Telehealth: Payer: Self-pay | Admitting: Family Medicine

## 2020-02-29 NOTE — Telephone Encounter (Signed)
Addressed in refill encounter from pharmacy.

## 2020-02-29 NOTE — Telephone Encounter (Signed)
Refill has been sent to pharmacy.  

## 2020-02-29 NOTE — Telephone Encounter (Signed)
Patient is unaware of who previously prescribed Rx and does not know what pharmacy she received these meds from.  Last take 2 days ago

## 2020-02-29 NOTE — Telephone Encounter (Signed)
°  Prescription Request  02/29/2020  What is the name of the medication or equipment?metoCLOPramide (REGLAN) 5 MG tablet    Have you contacted your pharmacy to request a refill? (if applicable) yes  Which pharmacy would you like this sent to? [facility] has appt with [facility] on 03/10/20   Patient notified that their request is being sent to the clinical staff for review and that they should receive a response within 2 business days.

## 2020-02-29 NOTE — Telephone Encounter (Signed)
Who was prescribing? It doesn't look like this came from our office.

## 2020-03-02 ENCOUNTER — Ambulatory Visit: Payer: Medicare HMO | Admitting: Family Medicine

## 2020-03-08 ENCOUNTER — Other Ambulatory Visit: Payer: Self-pay | Admitting: *Deleted

## 2020-03-08 ENCOUNTER — Other Ambulatory Visit: Payer: Self-pay

## 2020-03-08 DIAGNOSIS — E039 Hypothyroidism, unspecified: Secondary | ICD-10-CM

## 2020-03-08 DIAGNOSIS — K219 Gastro-esophageal reflux disease without esophagitis: Secondary | ICD-10-CM

## 2020-03-08 MED ORDER — PANTOPRAZOLE SODIUM 40 MG PO TBEC: DELAYED_RELEASE_TABLET | ORAL | 3 refills | Status: DC

## 2020-03-08 NOTE — Telephone Encounter (Signed)
Patient needs to be seen for refill Last office visit 11/11/2019 Last lab work 08/24/2018

## 2020-03-09 ENCOUNTER — Other Ambulatory Visit: Payer: Self-pay | Admitting: Family Medicine

## 2020-03-09 DIAGNOSIS — E785 Hyperlipidemia, unspecified: Secondary | ICD-10-CM

## 2020-03-09 NOTE — Telephone Encounter (Signed)
30 day supply sent  Pt has appt scheduled tomorrow with PCP

## 2020-03-10 ENCOUNTER — Other Ambulatory Visit: Payer: Self-pay

## 2020-03-10 ENCOUNTER — Ambulatory Visit (INDEPENDENT_AMBULATORY_CARE_PROVIDER_SITE_OTHER): Payer: Medicare HMO | Admitting: Family Medicine

## 2020-03-10 ENCOUNTER — Encounter: Payer: Self-pay | Admitting: Family Medicine

## 2020-03-10 VITALS — BP 91/46 | HR 62 | Temp 97.1°F | Ht 64.0 in | Wt 154.6 lb

## 2020-03-10 DIAGNOSIS — E039 Hypothyroidism, unspecified: Secondary | ICD-10-CM | POA: Diagnosis not present

## 2020-03-10 DIAGNOSIS — K219 Gastro-esophageal reflux disease without esophagitis: Secondary | ICD-10-CM

## 2020-03-10 DIAGNOSIS — IMO0002 Reserved for concepts with insufficient information to code with codable children: Secondary | ICD-10-CM

## 2020-03-10 DIAGNOSIS — E785 Hyperlipidemia, unspecified: Secondary | ICD-10-CM

## 2020-03-10 DIAGNOSIS — E1069 Type 1 diabetes mellitus with other specified complication: Secondary | ICD-10-CM

## 2020-03-10 DIAGNOSIS — L602 Onychogryphosis: Secondary | ICD-10-CM

## 2020-03-10 DIAGNOSIS — I5022 Chronic systolic (congestive) heart failure: Secondary | ICD-10-CM

## 2020-03-10 DIAGNOSIS — F339 Major depressive disorder, recurrent, unspecified: Secondary | ICD-10-CM

## 2020-03-10 DIAGNOSIS — E1065 Type 1 diabetes mellitus with hyperglycemia: Secondary | ICD-10-CM

## 2020-03-10 DIAGNOSIS — E1049 Type 1 diabetes mellitus with other diabetic neurological complication: Secondary | ICD-10-CM

## 2020-03-10 LAB — BAYER DCA HB A1C WAIVED: HB A1C (BAYER DCA - WAIVED): 9.4 % — ABNORMAL HIGH (ref <7.0)

## 2020-03-10 MED ORDER — ESCITALOPRAM OXALATE 20 MG PO TABS: 30.0000 mg | ORAL_TABLET | Freq: Every day | ORAL | 1 refills | Status: DC

## 2020-03-10 MED ORDER — ROSUVASTATIN CALCIUM 40 MG PO TABS: ORAL_TABLET | ORAL | 1 refills | Status: DC

## 2020-03-10 MED ORDER — PANTOPRAZOLE SODIUM 40 MG PO TBEC: DELAYED_RELEASE_TABLET | ORAL | 1 refills | Status: DC

## 2020-03-10 MED ORDER — LEVOTHYROXINE SODIUM 88 MCG PO TABS: 88.0000 ug | ORAL_TABLET | Freq: Every day | ORAL | 1 refills | Status: DC

## 2020-03-10 MED ORDER — BUPROPION HCL ER (XL) 150 MG PO TB24: 150.0000 mg | ORAL_TABLET | Freq: Two times a day (BID) | ORAL | 1 refills | Status: DC

## 2020-03-10 NOTE — Progress Notes (Signed)
Assessment & Plan:  1. Recurrent depression ([place]) - Previously well controlled. Experiencing normal grief at this time. Advised if depression does not improve to let me know.  - buPROPion (WELLBUTRIN XL) 150 MG 24 hr tablet; Take 1 tablet (150 mg total) by mouth 2 (two) times daily.  Dispense: 180 tablet; Refill: 1 - escitalopram (LEXAPRO) 20 MG tablet; Take 1.5 tablets (30 mg total) by mouth daily.  Dispense: 135 tablet; Refill: 1  2. Acquired hypothyroidism - levothyroxine (SYNTHROID) 88 MCG tablet; Take 1 tablet (88 mcg total) by mouth daily.  Dispense: 90 tablet; Refill: 1 - Hepatic function panel - TSH  3. Gastroesophageal reflux disease without esophagitis - Well controlled on current regimen.  - pantoprazole (PROTONIX) 40 MG tablet; TAKE 1 TABLET BY MOUTH EVERY DAY  Dispense: 90 tablet; Refill: 1 - Hepatic function panel  4. Hyperlipidemia due to type 1 diabetes mellitus (HCC) - rosuvastatin (CRESTOR) 40 MG tablet; 1 tablet daily  Dispense: 90 tablet; Refill: 1 - Hepatic function panel - Lipid panel (not fasting)  5. Thick nail - Ambulatory referral to Podiatry  6. DM (diabetes mellitus), type 1, uncontrolled w/neurologic complication ([place]) - Managed by Dr. Buddy Duty. - Ambulatory referral to [facility] Hb A1c Waived  7. Chronic systolic heart failure ([place]) - Managed by cardiology.    Return in about 6 months (around 09/10/2020) for follow-up of chronic medication conditions.  Hendricks Limes, MSN, APRN, FNP-C [facility]  Subjective:    Patient ID: Bridget Martinez, female    DOB: 1951/06/13, 69 y.o.   MRN: [id number]  Patient Care Team: Loman Brooklyn, FNP as PCP - General (Family Medicine) [name], Shaune Pascal, MD (Cardiology) Delrae Rend, MD as Attending Physician (Internal Medicine)   Chief Complaint:  Chief Complaint  Patient presents with  . Establish Care    [name]  . Diabetes    check up of chronic medical conditions  .  Referral    podiatrist- to cut her toe nails    HPI: Bridget Martinez is a 69 y.o. female presenting on 03/10/2020 for Establish Care ([name]), Diabetes (check up of chronic medical conditions), and Referral (podiatrist- to cut her toe nails)  Patient reports depression has been well controlled until her husband passed away 3 weeks ago.  She has been having a more difficult time under the circumstances.  Chronic kidney disease is managed by nephrologist, Dr. Theador Hawthorne.  Heart failure is managed by cardiologist, Dr. Haroldine Laws.  Diabetes is managed by endocrinologist, Dr. Buddy Duty.  New complaints: Patient is requesting a referral to a podiatrist as she is unable to cut her toenails since they are so long and thick.   Social history:  Relevant past medical, surgical, family and social history reviewed and updated as indicated. Interim medical history since our last visit reviewed.  Allergies and medications reviewed and updated.  DATA REVIEWED: CHART IN EPIC  ROS: Negative unless specifically indicated above in HPI.    Current Outpatient Medications:  .  aspirin 81 MG EC tablet, Take 1 tablet (81 mg total) by mouth daily., Disp: 30 tablet, Rfl: 3 .  buPROPion (WELLBUTRIN XL) 150 MG 24 hr tablet, TAKE 1 TABLET BY MOUTH TWICE A DAY, Disp: 180 tablet, Rfl: 0 .  carvedilol (COREG) 6.25 MG tablet, TAKE 1 TABLET (6.25 MG TOTAL) BY MOUTH 2 TIMES DAILY WITH A MEAL. CALL FOR OFFICE VISIT [phone], Disp: 180 tablet, Rfl: 1 .  ENTRESTO 97-103 MG, TAKE 1 TABLET BY  MOUTH TWICE A DAY, Disp: 180 tablet, Rfl: 2 .  Insulin Detemir (LEVEMIR FLEXPEN) 100 UNIT/ML Pen, Inject 18 Units into the skin at bedtime. 22 in am, Disp: , Rfl:  .  insulin lispro (HUMALOG) 100 UNIT/ML injection, Inject 1-22 Units into the skin 3 (three) times daily before meals., Disp: , Rfl:  .  levothyroxine (SYNTHROID) 88 MCG tablet, Take 1 tablet (88 mcg total) by mouth daily., Disp: 90 tablet, Rfl: 3 .  metoCLOPramide (REGLAN) 5  MG tablet, TAKE 1 TABLET (5 MG TOTAL) BY MOUTH 3 (THREE) TIMES DAILY BEFORE MEALS., Disp: 270 tablet, Rfl: 1 .  ONE TOUCH ULTRA TEST test strip, CHECK BLOOD SUGAR UP TO 3 TIMES DAILY, Disp: , Rfl:  .  pantoprazole (PROTONIX) 40 MG tablet, TAKE 1 TABLET BY MOUTH EVERY DAY, Disp: 90 tablet, Rfl: 3 .  rosuvastatin (CRESTOR) 40 MG tablet, TAKE 1 TABLET BY MOUTH EVERY DAY (NEEDS TO BE SEEN BEFORE NEXT REFILL), Disp: 30 tablet, Rfl: 0 .  escitalopram (LEXAPRO) 20 MG tablet, Take 1.5 tablets (30 mg total) by mouth daily. (Needs to be seen before next refill), Disp: 135 tablet, Rfl: 3 .  potassium chloride SA (KLOR-CON M20) 20 MEQ tablet, Take 1 tablet (20 mEq total) by mouth daily as needed., Disp: 30 tablet, Rfl: 3   Allergies  Allergen Reactions  . Penicillins Anaphylaxis and Hives    Tolerates zosyn Has patient had a PCN reaction causing immediate rash, facial/tongue/throat swelling, SOB or lightheadedness with hypotension: Yes Has patient had a PCN reaction causing severe rash involving mucus membranes or skin necrosis: No Has patient had a PCN reaction that required hospitalization: Yes Has patient had a PCN reaction occurring within the last 10 years: No If all of the above answers are "NO", then may proceed with Cephalosporin use.   . Sulfa Antibiotics Anaphylaxis and Swelling    Stiff neck also   . Macrobid [employer] Macro] Other (See Comments)    Skin peels off  . Ciprofloxacin Hcl Other (See Comments)    Skin peeling   Past Medical History:  Diagnosis Date  . Arthritis   . CHF (congestive heart failure) ([place])   . Coronary artery disease    angina.  MI.   . Depression    anxiety  . Diabetes mellitus    on insulin, with h/o DKA   . GERD (gastroesophageal reflux disease)    Hiatal hernia.   Marland Kitchen HTN (hypertension)   . Hyperlipidemia   . Hypothyroidism   . Left displaced femoral neck fracture ([place]) 11/19/2014  . Myocardial infarction [facility])    NSTEMI 07/2011 with  cardiogenic shock, s/p CABG 09/2011  . Osteoporosis   . Pneumonia       . Tuberculosis   . Venous stasis ulcers (HCC)     Past Surgical History:  Procedure Laterality Date  . CARDIAC CATHETERIZATION    . COLONOSCOPY N/A 11/18/2013   Procedure: COLONOSCOPY;  Surgeon: Lafayette Dragon, MD;  Location: [facility] ENDOSCOPY;  Service: Endoscopy;  Laterality: N/A;  . CORONARY ARTERY BYPASS GRAFT     Dr. Prescott Gum in 09/2011   . CORONARY ARTERY BYPASS GRAFT  2012  . ESOPHAGOGASTRODUODENOSCOPY N/A 11/17/2013   Procedure: ESOPHAGOGASTRODUODENOSCOPY (EGD);  Surgeon: Lafayette Dragon, MD;  Location: [facility] ENDOSCOPY;  Service: Endoscopy;  Laterality: N/A;  . LEFT HEART CATHETERIZATION WITH CORONARY ANGIOGRAM N/A 07/26/2011   Procedure: LEFT HEART CATHETERIZATION WITH CORONARY ANGIOGRAM;  Surgeon: Larey Dresser, MD;  Location: [facility] CATH LAB;  Service: Cardiovascular;  Laterality: N/A;  . RIGHT HEART CATHETERIZATION N/A 07/29/2011   Procedure: RIGHT HEART CATH;  Surgeon: Minus Breeding, MD;  Location: [facility] CATH LAB;  Service: Cardiovascular;  Laterality: N/A;  . TONSILLECTOMY    . TOTAL HIP ARTHROPLASTY Left 11/21/2014   Procedure: TOTAL HIP ARTHROPLASTY ANTERIOR APPROACH;  Surgeon: Mcarthur Rossetti, MD;  Location: [facility];  Service: Orthopedics;  Laterality: Left;    Social History   Socioeconomic History  . Marital status: Married    Spouse name: Grayland Ormond [phone]; Son Rush Landmark  . Number of children: 2  . Years of education: 20  . Highest education level: Bachelor's degree (e.g., BA, AB, BS)  Occupational History  . Occupation: Retired  Tobacco Use  . Smoking status: Never Smoker  . Smokeless tobacco: Never Used  Vaping Use  . Vaping Use: Never used  Substance and Sexual Activity  . Alcohol use: No  . Drug use: No  . Sexual activity: Not on file  Other Topics Concern  . Not on file  Social History Narrative   ** Merged History Encounter **       Shulamit Donofrio (Husband): [phone] [facility]) [phone]  (Cell)   Social Determinants of Health   Financial Resource Strain: Low Risk   . Difficulty of Paying Living Expenses: Not hard at all  Food Insecurity: No Food Insecurity  . Worried About Charity fundraiser in the Last Year: Never true  . Ran Out of Food in the Last Year: Never true  Transportation Needs: Unmet Transportation Needs  . Lack of Transportation (Medical): Yes  . Lack of Transportation (Non-Medical): Yes  Physical Activity: Inactive  . Days of Exercise per Week: 0 days  . Minutes of Exercise per Session: 0 min  Stress: No Stress Concern Present  . Feeling of Stress : Not at all  Social Connections: Socially Integrated  . Frequency of Communication with Friends and Family: More than three times a week  . Frequency of Social Gatherings with Friends and Family: More than three times a week  . Attends Religious Services: More than 4 times per year  . Active Member of Clubs or Organizations: Yes  . Attends Archivist Meetings: More than 4 times per year  . Marital Status: Married  Human resources officer Violence: Not At Risk  . Fear of Current or Ex-Partner: No  . Emotionally Abused: No  . Physically Abused: No  . Sexually Abused: No        Objective:    BP (!) 91/46   Pulse 62   Temp (!) 97.1 F (36.2 C) (Temporal)   Ht 5\' 4"  (1.626 m)   Wt 154 lb 9.6 oz (70.1 kg)   SpO2 100%   BMI 26.54 kg/m   Wt Readings from Last 3 Encounters:  03/10/20 154 lb 9.6 oz (70.1 kg)  02/16/20 159 lb 2.8 oz (72.2 kg)  07/08/19 159 lb 3.2 oz (72.2 kg)    Physical Exam Vitals reviewed.  Constitutional:      General: She is not in acute distress.    Appearance: Normal appearance. She is overweight. She is not ill-appearing, toxic-appearing or diaphoretic.  HENT:     Head: Normocephalic and atraumatic.  Eyes:     General: No scleral icterus.       Right eye: No discharge.        Left eye: No discharge.     Conjunctiva/sclera: Conjunctivae normal.  Cardiovascular:      Rate and Rhythm:  Normal rate and regular rhythm.     Heart sounds: Normal heart sounds. No murmur heard.  No friction rub. No gallop.   Pulmonary:     Effort: Pulmonary effort is normal. No respiratory distress.     Breath sounds: Normal breath sounds. No stridor. No wheezing, rhonchi or rales.  Musculoskeletal:        General: Normal range of motion.     Cervical back: Normal range of motion.  Feet:     Right foot:     Toenail Condition: Right toenails are abnormally thick and long.     Left foot:     Toenail Condition: Left toenails are abnormally thick and long.  Skin:    General: Skin is warm and dry.     Capillary Refill: Capillary refill takes less than 2 seconds.  Neurological:     General: No focal deficit present.     Mental Status: She is alert and oriented to person, place, and time. Mental status is at baseline.  Psychiatric:        Mood and Affect: Mood normal.        Behavior: Behavior normal.        Thought Content: Thought content normal.        Judgment: Judgment normal.     Lab Results  Component Value Date   TSH 1.080 03/10/2020   Lab Results  Component Value Date   WBC 7.2 08/14/2018   HGB 9.9 (L) 08/14/2018   HCT 30.6 (L) 08/14/2018   MCV 85 08/14/2018   PLT 267 08/14/2018   Lab Results  Component Value Date   NA 138 08/14/2018   K 4.6 08/14/2018   CO2 21 08/14/2018   GLUCOSE 192 (H) 08/14/2018   BUN 27 08/14/2018   CREATININE 1.25 (H) 08/14/2018   BILITOT 0.4 03/10/2020   ALKPHOS 106 03/10/2020   AST 10 03/10/2020   ALT 11 03/10/2020   PROT 6.6 03/10/2020   ALBUMIN 4.3 03/10/2020   CALCIUM 9.6 08/14/2018   ANIONGAP 9 03/27/2018   Lab Results  Component Value Date   CHOL 128 03/10/2020   Lab Results  Component Value Date   HDL 63 03/10/2020   Lab Results  Component Value Date   LDLCALC 53 03/10/2020   Lab Results  Component Value Date   TRIG 55 03/10/2020   Lab Results  Component Value Date   CHOLHDL 2.0 03/10/2020    Lab Results  Component Value Date   HGBA1C 9.4 (H) 03/10/2020

## 2020-03-11 LAB — HEPATIC FUNCTION PANEL
ALT: 11 IU/L (ref 0–32)
AST: 10 IU/L (ref 0–40)
Albumin: 4.3 g/dL (ref 3.8–4.8)
Alkaline Phosphatase: 106 IU/L (ref 48–121)
Bilirubin Total: 0.4 mg/dL (ref 0.0–1.2)
Bilirubin, Direct: 0.16 mg/dL (ref 0.00–0.40)
Total Protein: 6.6 g/dL (ref 6.0–8.5)

## 2020-03-11 LAB — LIPID PANEL
Chol/HDL Ratio: 2 ratio (ref 0.0–4.4)
Cholesterol, Total: 128 mg/dL (ref 100–199)
HDL: 63 mg/dL (ref >39)
LDL Chol Calc (NIH): 53 mg/dL (ref 0–99)
Triglycerides: 55 mg/dL (ref 0–149)
VLDL Cholesterol Cal: 12 mg/dL (ref 5–40)

## 2020-03-11 LAB — TSH: TSH: 1.08 u[IU]/mL (ref 0.450–4.500)

## 2020-03-12 ENCOUNTER — Encounter: Payer: Self-pay | Admitting: Family Medicine

## 2020-03-14 ENCOUNTER — Encounter: Payer: Self-pay | Admitting: Family Medicine

## 2020-03-14 NOTE — Progress Notes (Signed)
done

## 2020-03-17 ENCOUNTER — Telehealth: Payer: Self-pay | Admitting: Family Medicine

## 2020-04-04 ENCOUNTER — Telehealth: Payer: Self-pay | Admitting: Nutrition

## 2020-04-04 NOTE — Telephone Encounter (Signed)
Patient reported that she got a new transmitter from [facility], and is not having problems with it, at this time.  She was given my name, and number to call if she has more difficulty with this.

## 2020-04-25 ENCOUNTER — Telehealth: Payer: Self-pay | Admitting: Family Medicine

## 2020-04-25 NOTE — Telephone Encounter (Signed)
Pt aware all medications have refills at [facility]

## 2020-04-25 NOTE — Telephone Encounter (Signed)
°  Prescription Request  04/25/2020  What is the name of the medication or equipment? Pt said she needs all her meds sent in.  Have you contacted your pharmacy to request a refill? (if applicable) yes , she said that they are waiting on dr to send in rx  [facility]   Patient notified that their request is being sent to the clinical staff for review and that they should receive a response within 2 business days.

## 2020-05-29 ENCOUNTER — Ambulatory Visit: Payer: Medicare Other | Admitting: Dietician

## 2020-08-02 ENCOUNTER — Encounter: Payer: Self-pay | Admitting: *Deleted

## 2020-09-05 ENCOUNTER — Other Ambulatory Visit: Payer: Self-pay | Admitting: Family Medicine

## 2020-09-05 DIAGNOSIS — E1043 Type 1 diabetes mellitus with diabetic autonomic (poly)neuropathy: Secondary | ICD-10-CM

## 2020-09-06 ENCOUNTER — Other Ambulatory Visit: Payer: Self-pay | Admitting: Family Medicine

## 2020-09-06 DIAGNOSIS — E039 Hypothyroidism, unspecified: Secondary | ICD-10-CM

## 2020-09-07 ENCOUNTER — Ambulatory Visit: Payer: Medicare HMO | Admitting: Family Medicine

## 2020-09-21 ENCOUNTER — Ambulatory Visit: Payer: Medicare HMO | Admitting: Family Medicine

## 2020-10-02 ENCOUNTER — Other Ambulatory Visit: Payer: Self-pay | Admitting: Family Medicine

## 2020-10-02 DIAGNOSIS — F339 Major depressive disorder, recurrent, unspecified: Secondary | ICD-10-CM

## 2020-10-06 LAB — MICROALBUMIN, URINE: Microalb, Ur: 481

## 2020-10-21 ENCOUNTER — Other Ambulatory Visit: Payer: Self-pay | Admitting: Family Medicine

## 2020-10-21 DIAGNOSIS — E785 Hyperlipidemia, unspecified: Secondary | ICD-10-CM

## 2020-10-21 DIAGNOSIS — E1069 Type 1 diabetes mellitus with other specified complication: Secondary | ICD-10-CM

## 2020-10-31 ENCOUNTER — Other Ambulatory Visit: Payer: Self-pay | Admitting: Family Medicine

## 2020-10-31 DIAGNOSIS — F339 Major depressive disorder, recurrent, unspecified: Secondary | ICD-10-CM

## 2020-10-31 NOTE — Telephone Encounter (Signed)
Britney. NTBS 30 days given 10/02/20

## 2020-11-03 ENCOUNTER — Other Ambulatory Visit: Payer: Self-pay | Admitting: Family Medicine

## 2020-11-03 NOTE — Telephone Encounter (Signed)
  Prescription Request  11/03/2020  What is the name of the medication or equipment? Levemir  Have you contacted your pharmacy to request a refill? (if applicable) yes  Which pharmacy would you [facility] to[facility] [place]   Patient notified that their request is being sent to the clinical staff for review and that they should receive a response within 2 business days.   *appt made for 3/4*

## 2020-11-06 MED ORDER — LEVEMIR FLEXPEN 100 UNIT/ML ~~LOC~~ SOPN: PEN_INJECTOR | SUBCUTANEOUS | 0 refills | Status: DC

## 2020-11-10 ENCOUNTER — Ambulatory Visit: Payer: Medicare HMO | Admitting: Family Medicine

## 2020-11-10 ENCOUNTER — Other Ambulatory Visit: Payer: Self-pay

## 2020-11-10 ENCOUNTER — Encounter: Payer: Self-pay | Admitting: Family Medicine

## 2020-11-10 VITALS — BP 135/67 | HR 59 | Temp 97.5°F | Ht 64.0 in | Wt 154.0 lb

## 2020-11-10 DIAGNOSIS — E039 Hypothyroidism, unspecified: Secondary | ICD-10-CM

## 2020-11-10 DIAGNOSIS — E1065 Type 1 diabetes mellitus with hyperglycemia: Secondary | ICD-10-CM

## 2020-11-10 DIAGNOSIS — F339 Major depressive disorder, recurrent, unspecified: Secondary | ICD-10-CM

## 2020-11-10 DIAGNOSIS — N1832 Chronic kidney disease, stage 3b: Secondary | ICD-10-CM

## 2020-11-10 DIAGNOSIS — E1069 Type 1 diabetes mellitus with other specified complication: Secondary | ICD-10-CM

## 2020-11-10 DIAGNOSIS — E1159 Type 2 diabetes mellitus with other circulatory complications: Secondary | ICD-10-CM

## 2020-11-10 DIAGNOSIS — K219 Gastro-esophageal reflux disease without esophagitis: Secondary | ICD-10-CM

## 2020-11-10 DIAGNOSIS — Z8611 Personal history of tuberculosis: Secondary | ICD-10-CM | POA: Insufficient documentation

## 2020-11-10 DIAGNOSIS — I5042 Chronic combined systolic (congestive) and diastolic (congestive) heart failure: Secondary | ICD-10-CM

## 2020-11-10 DIAGNOSIS — I152 Hypertension secondary to endocrine disorders: Secondary | ICD-10-CM

## 2020-11-10 DIAGNOSIS — E1049 Type 1 diabetes mellitus with other diabetic neurological complication: Secondary | ICD-10-CM

## 2020-11-10 DIAGNOSIS — E785 Hyperlipidemia, unspecified: Secondary | ICD-10-CM

## 2020-11-10 DIAGNOSIS — Z0000 Encounter for general adult medical examination without abnormal findings: Secondary | ICD-10-CM

## 2020-11-10 DIAGNOSIS — M810 Age-related osteoporosis without current pathological fracture: Secondary | ICD-10-CM | POA: Insufficient documentation

## 2020-11-10 DIAGNOSIS — Z6827 Body mass index (BMI) 27.0-27.9, adult: Secondary | ICD-10-CM | POA: Insufficient documentation

## 2020-11-10 DIAGNOSIS — IMO0002 Reserved for concepts with insufficient information to code with codable children: Secondary | ICD-10-CM

## 2020-11-10 MED ORDER — BUPROPION HCL ER (XL) 150 MG PO TB24: 150.0000 mg | ORAL_TABLET | Freq: Two times a day (BID) | ORAL | 1 refills | Status: DC

## 2020-11-10 MED ORDER — MIRTAZAPINE 15 MG PO TABS: 15.0000 mg | ORAL_TABLET | Freq: Every day | ORAL | 2 refills | Status: DC

## 2020-11-10 MED ORDER — ROSUVASTATIN CALCIUM 40 MG PO TABS: 40.0000 mg | ORAL_TABLET | Freq: Every day | ORAL | 1 refills | Status: DC

## 2020-11-10 MED ORDER — ESCITALOPRAM OXALATE 20 MG PO TABS: ORAL_TABLET | ORAL | 0 refills | Status: DC

## 2020-11-10 MED ORDER — LEVOTHYROXINE SODIUM 88 MCG PO TABS: 88.0000 ug | ORAL_TABLET | Freq: Every day | ORAL | 1 refills | Status: DC

## 2020-11-10 MED ORDER — PANTOPRAZOLE SODIUM 40 MG PO TBEC: 40.0000 mg | DELAYED_RELEASE_TABLET | Freq: Every day | ORAL | 1 refills | Status: DC

## 2020-11-10 NOTE — Patient Instructions (Signed)
Wean off Lexapro by decreasing to 20 mg daily x1 week, then 10 mg daily x1 week, then 10 mg every other day x1 week, then stop.   Schedule your diabetic eye exam. Please have them send [place] a copy as well as Dr. Buddy Duty.

## 2020-11-10 NOTE — Progress Notes (Signed)
Assessment & Plan:  1. Recurrent depression ([place]) Uncontrolled.  Patient is going to wean off Lexapro and start on mirtazapine. - buPROPion (WELLBUTRIN XL) 150 MG 24 hr tablet; Take 1 tablet (150 mg total) by mouth 2 (two) times daily.  Dispense: 180 tablet; Refill: 1 - escitalopram (LEXAPRO) 20 MG tablet; Take 1 tablet (20 mg total) by mouth daily for 7 days, THEN 0.5 tablets (10 mg total) daily for 7 days, THEN 0.5 tablets (10 mg total) every other day for 7 days.  Dispense: 15 tablet; Refill: 0 - mirtazapine (REMERON) 15 MG tablet; Take 1 tablet (15 mg total) by mouth at bedtime.  Dispense: 30 tablet; Refill: 2  2. Stage 3b chronic kidney disease ([place]) Managed by nephrology.  Keep follow-up appointment for next week.  3. Hypertension associated with diabetes ([place]) Well controlled on current regimen.   4. Chronic combined systolic and diastolic congestive heart failure ([place]) Managed by cardiology.  5. DM (diabetes mellitus), type 1, uncontrolled w/neurologic complication [facility]) Managed by endocrinology.  Encouraged patient to schedule a diabetic eye exam. - insulin aspart (NOVOLOG FLEXPEN) 100 UNIT/ML FlexPen; 13 to 17 units at breakfast, 7 to 11 units at lunch and at supper - Continuous Blood Gluc Receiver ([place]) [place]; See admin instructions. - Continuous Blood Gluc Sensor (DEXCOM G6 SENSOR) MISC; See admin instructions. - Continuous Blood Gluc Transmit (DEXCOM G6 TRANSMITTER) MISC; See admin instructions. - Microalbumin, urine  6. Hyperlipidemia due to type 1 diabetes mellitus ([place]) Well controlled on current regimen.  Patient would like to wait to have her lipid panel completed closer to when it has been a year since her last panel. - rosuvastatin (CRESTOR) 40 MG tablet; Take 1 tablet (40 mg total) by mouth daily.  Dispense: 90 tablet; Refill: 1  7. Acquired hypothyroidism Well controlled on current regimen.  Patient would like to wait until it is closer to a year  since she had her last lab work for her thyroid. - levothyroxine (SYNTHROID) 88 MCG tablet; Take 1 tablet (88 mcg total) by mouth daily.  Dispense: 90 tablet; Refill: 1  8. Gastroesophageal reflux disease without esophagitis Well controlled on current regimen.  - pantoprazole (PROTONIX) 40 MG tablet; Take 1 tablet (40 mg total) by mouth daily.  Dispense: 90 tablet; Refill: 1  9. Healthcare maintenance Patient is planning to schedule her COVID-19 booster and mammogram.   Return in about 6 weeks (around 12/22/2020) for depression (telephone).  Hendricks Limes, MSN, APRN, FNP-C [facility]  Subjective:    Patient ID: Bridget Martinez, female    DOB: 1950/11/13, 70 y.o.   MRN: [id number]  Patient Care Team: Loman Brooklyn, FNP as PCP - General (Family Medicine) [name], Shaune Pascal, MD (Cardiology) Delrae Rend, MD as Attending Physician (Internal Medicine)   Chief Complaint:  Chief Complaint  Patient presents with  . Depression    Check up of chronic medical conditions- Patient states that her depression is worse and she crys everyday.    HPI: Bridget Martinez is a 70 y.o. female presenting on 11/10/2020 for Depression (Check up of chronic medical conditions- Patient states that her depression is worse and she crys everyday.)  Patient is accompanied by her daughter who she is okay with being present.  Patient does not feel her depression is well controlled.  She is crying on a daily basis.  When we last saw each other over 6 months ago her husband had just passed away.  Her daughter  reports she has been on Lexapro and Wellbutrin for more than 10 years.  Daughter reports patient had a bottle of Zoloft in the cabinet, but patient does not recall if she ever took this medication or not.  Depression screen [facility] 2/9 11/10/2020 03/12/2020 02/16/2020  Decreased Interest 2 1 0  Down, Depressed, Hopeless '2 1 1  '$ PHQ - 2 Score '4 2 1  '$ Altered sleeping 2 1 -  Tired, decreased  energy 2 2 -  Change in appetite 2 1 -  Feeling bad or failure about yourself  2 1 -  Trouble concentrating 2 1 -  Moving slowly or fidgety/restless 0 1 -  Suicidal thoughts 0 1 -  PHQ-9 Score 14 10 -  Difficult doing work/chores Very difficult Somewhat difficult -  Some recent data might be hidden   GAD 7 : Generalized Anxiety Score 11/10/2020 03/12/2020  Nervous, Anxious, on Edge 3 1  Control/stop worrying 2 1  Worry too much - different things 2 1  Trouble relaxing 2 1  Restless 0 1  Easily annoyed or irritable 1 1  Afraid - awful might happen 2 1  Total GAD 7 Score 12 7  Anxiety Difficulty Very difficult Somewhat difficult    CKD: Managed by nephrology.  She has an appointment with Dr. Theador Hawthorne next week.  Diabetes: Managed by Dr. Buddy Duty, endocrinologist.  States it has been a couple months since she saw him and she is scheduled to follow-up soon.  Reports her last A1c was in the 10s.  She gets her diabetic eye exams out in [place], but cannot remember the name of the doctor or office.  New complaints: None  Social history:  Relevant past medical, surgical, family and social history reviewed and updated as indicated. Interim medical history since our last visit reviewed.  Allergies and medications reviewed and updated.  DATA REVIEWED: CHART IN EPIC  ROS: Negative unless specifically indicated above in HPI.    Current Outpatient Medications:  .  aspirin 81 MG EC tablet, Take 1 tablet (81 mg total) by mouth daily., Disp: 30 tablet, Rfl: 3 .  buPROPion (WELLBUTRIN XL) 150 MG 24 hr tablet, Take 1 tablet (150 mg total) by mouth 2 (two) times daily., Disp: 180 tablet, Rfl: 1 .  carvedilol (COREG) 6.25 MG tablet, TAKE 1 TABLET (6.25 MG TOTAL) BY MOUTH 2 TIMES DAILY WITH A MEAL. CALL FOR OFFICE VISIT [phone], Disp: 180 tablet, Rfl: 1 .  Continuous Blood Gluc Receiver ([place]) DEVI, See admin instructions., Disp: , Rfl:  .  Continuous Blood Gluc Sensor (DEXCOM G6  SENSOR) MISC, See admin instructions., Disp: , Rfl:  .  Continuous Blood Gluc Transmit (DEXCOM G6 TRANSMITTER) MISC, See admin instructions., Disp: , Rfl:  .  ENTRESTO 97-103 MG, TAKE 1 TABLET BY MOUTH TWICE A DAY, Disp: 180 tablet, Rfl: 2 .  escitalopram (LEXAPRO) 20 MG tablet, Take 1.5 tablets (30 mg total) by mouth daily. (Needs to be seen before next refill), Disp: 45 tablet, Rfl: 0 .  insulin aspart (NOVOLOG FLEXPEN) 100 UNIT/ML FlexPen, 13 to 17 units at breakfast, 7 to 11 units at lunch and at supper, Disp: , Rfl:  .  insulin detemir (LEVEMIR FLEXPEN) 100 UNIT/ML FlexPen, Inject 22 Units into the skin in the morning AND 18 Units at bedtime., Disp: 15 mL, Rfl: 0 .  levothyroxine (SYNTHROID) 88 MCG tablet, TAKE 1 TABLET BY MOUTH EVERY DAY, Disp: 90 tablet, Rfl: 1 .  metoCLOPramide (REGLAN) 5 MG tablet, TAKE  1 TABLET (5 MG TOTAL) BY MOUTH 3 (THREE) TIMES DAILY BEFORE MEALS., Disp: 270 tablet, Rfl: 0 .  ONE TOUCH ULTRA TEST test strip, CHECK BLOOD SUGAR UP TO 3 TIMES DAILY, Disp: , Rfl:  .  pantoprazole (PROTONIX) 40 MG tablet, TAKE 1 TABLET BY MOUTH EVERY DAY, Disp: 90 tablet, Rfl: 1 .  rosuvastatin (CRESTOR) 40 MG tablet, TAKE 1 TABLET BY MOUTH EVERY DAY (Needs to be seen before next refill), Disp: 30 tablet, Rfl: 0   Allergies  Allergen Reactions  . Penicillins Anaphylaxis and Hives    Tolerates zosyn Has patient had a PCN reaction causing immediate rash, facial/tongue/throat swelling, SOB or lightheadedness with hypotension: Yes Has patient had a PCN reaction causing severe rash involving mucus membranes or skin necrosis: No Has patient had a PCN reaction that required hospitalization: Yes Has patient had a PCN reaction occurring within the last 10 years: No If all of the above answers are "NO", then may proceed with Cephalosporin use.   . Sulfa Antibiotics Anaphylaxis and Swelling    Stiff neck also   . Macrobid [employer] Macro] Other (See Comments)    Skin peels off   . Other Other (See Comments)  . Ciprofloxacin Hcl Other (See Comments)    Skin peeling   Past Medical History:  Diagnosis Date  . Arthritis   . CHF (congestive heart failure) ([place])   . Coronary artery disease    angina.  MI.   . Depression    anxiety  . Diabetes mellitus    on insulin, with h/o DKA   . GERD (gastroesophageal reflux disease)    Hiatal hernia.   Marland Kitchen HTN (hypertension)   . Hyperlipidemia   . Hypothyroidism   . Left displaced femoral neck fracture ([place]) 11/19/2014  . Myocardial infarction [facility])    NSTEMI 07/2011 with cardiogenic shock, s/p CABG 09/2011  . Osteoporosis   . Pneumonia       . Tuberculosis   . Venous stasis ulcers (HCC)     Past Surgical History:  Procedure Laterality Date  . CARDIAC CATHETERIZATION    . COLONOSCOPY N/A 11/18/2013   Procedure: COLONOSCOPY;  Surgeon: Lafayette Dragon, MD;  Location: [facility] ENDOSCOPY;  Service: Endoscopy;  Laterality: N/A;  . CORONARY ARTERY BYPASS GRAFT     Dr. Prescott Gum in 09/2011   . CORONARY ARTERY BYPASS GRAFT  2012  . ESOPHAGOGASTRODUODENOSCOPY N/A 11/17/2013   Procedure: ESOPHAGOGASTRODUODENOSCOPY (EGD);  Surgeon: Lafayette Dragon, MD;  Location: [facility] ENDOSCOPY;  Service: Endoscopy;  Laterality: N/A;  . LEFT HEART CATHETERIZATION WITH CORONARY ANGIOGRAM N/A 07/26/2011   Procedure: LEFT HEART CATHETERIZATION WITH CORONARY ANGIOGRAM;  Surgeon: Larey Dresser, MD;  Location: [facility] CATH LAB;  Service: Cardiovascular;  Laterality: N/A;  . RIGHT HEART CATHETERIZATION N/A 07/29/2011   Procedure: RIGHT HEART CATH;  Surgeon: Minus Breeding, MD;  Location: [facility] CATH LAB;  Service: Cardiovascular;  Laterality: N/A;  . TONSILLECTOMY    . TOTAL HIP ARTHROPLASTY Left 11/21/2014   Procedure: TOTAL HIP ARTHROPLASTY ANTERIOR APPROACH;  Surgeon: Mcarthur Rossetti, MD;  Location: [place];  Service: Orthopedics;  Laterality: Left;    Social History   Socioeconomic History  . Marital status: Married    Spouse name: Grayland Ormond [phone]; Son Rush Landmark   . Number of children: 2  . Years of education: 15  . Highest education level: Bachelor's degree (e.g., BA, AB, BS)  Occupational History  . Occupation: Retired  Tobacco Use  . Smoking status: Never Smoker  .  Smokeless tobacco: Never Used  Vaping Use  . Vaping Use: Never used  Substance and Sexual Activity  . Alcohol use: No  . Drug use: No  . Sexual activity: Not on file  Other Topics Concern  . Not on file  Social History Narrative   ** Merged History Encounter **       Aneatra Spellman (Husband): [phone] [employer]) [phone] (Cell)   Social Determinants of Health   Financial Resource Strain: Low Risk   . Difficulty of Paying Living Expenses: Not hard at all  Food Insecurity: No Food Insecurity  . Worried About Charity fundraiser in the Last Year: Never true  . Ran Out of Food in the Last Year: Never true  Transportation Needs: Unmet Transportation Needs  . Lack of Transportation (Medical): Yes  . Lack of Transportation (Non-Medical): Yes  Physical Activity: Inactive  . Days of Exercise per Week: 0 days  . Minutes of Exercise per Session: 0 min  Stress: No Stress Concern Present  . Feeling of Stress : Not at all  Social Connections: Socially Integrated  . Frequency of Communication with Friends and Family: More than three times a week  . Frequency of Social Gatherings with Friends and Family: More than three times a week  . Attends Religious Services: More than 4 times per year  . Active Member of Clubs or Organizations: Yes  . Attends Archivist Meetings: More than 4 times per year  . Marital Status: Married  Human resources officer Violence: Not At Risk  . Fear of Current or Ex-Partner: No  . Emotionally Abused: No  . Physically Abused: No  . Sexually Abused: No        Objective:    BP 135/67   Pulse (!) 59   Temp (!) 97.5 F (36.4 C) (Temporal)   Ht '5\' 4"'$  (1.626 m)   Wt 154 lb (69.9 kg)   SpO2 97%   BMI 26.43 kg/m   Wt Readings from Last 3  Encounters:  11/10/20 154 lb (69.9 kg)  03/10/20 154 lb 9.6 oz (70.1 kg)  02/16/20 159 lb 2.8 oz (72.2 kg)    Physical Exam Vitals reviewed.  Constitutional:      General: She is not in acute distress.    Appearance: Normal appearance. She is overweight. She is not ill-appearing, toxic-appearing or diaphoretic.  HENT:     Head: Normocephalic and atraumatic.  Eyes:     General: No scleral icterus.       Right eye: No discharge.        Left eye: No discharge.     Conjunctiva/sclera: Conjunctivae normal.  Cardiovascular:     Rate and Rhythm: Normal rate and regular rhythm.     Heart sounds: Normal heart sounds. No murmur heard. No friction rub. No gallop.   Pulmonary:     Effort: Pulmonary effort is normal. No respiratory distress.     Breath sounds: Normal breath sounds. No stridor. No wheezing, rhonchi or rales.  Musculoskeletal:        General: Normal range of motion.     Cervical back: Normal range of motion.  Skin:    General: Skin is warm and dry.     Capillary Refill: Capillary refill takes less than 2 seconds.  Neurological:     General: No focal deficit present.     Mental Status: She is alert and oriented to person, place, and time. Mental status is at baseline.  Psychiatric:  Mood and Affect: Mood normal.        Behavior: Behavior normal.        Thought Content: Thought content normal.        Judgment: Judgment normal.     Lab Results  Component Value Date   TSH 1.080 03/10/2020   Lab Results  Component Value Date   WBC 7.2 08/14/2018   HGB 9.9 (L) 08/14/2018   HCT 30.6 (L) 08/14/2018   MCV 85 08/14/2018   PLT 267 08/14/2018   Lab Results  Component Value Date   NA 138 08/14/2018   K 4.6 08/14/2018   CO2 21 08/14/2018   GLUCOSE 192 (H) 08/14/2018   BUN 27 08/14/2018   CREATININE 1.25 (H) 08/14/2018   BILITOT 0.4 03/10/2020   ALKPHOS 106 03/10/2020   AST 10 03/10/2020   ALT 11 03/10/2020   PROT 6.6 03/10/2020   ALBUMIN 4.3 03/10/2020    CALCIUM 9.6 08/14/2018   ANIONGAP 9 03/27/2018   Lab Results  Component Value Date   CHOL 128 03/10/2020   Lab Results  Component Value Date   HDL 63 03/10/2020   Lab Results  Component Value Date   LDLCALC 53 03/10/2020   Lab Results  Component Value Date   TRIG 55 03/10/2020   Lab Results  Component Value Date   CHOLHDL 2.0 03/10/2020   Lab Results  Component Value Date   HGBA1C 9.4 (H) 03/10/2020

## 2020-12-05 ENCOUNTER — Other Ambulatory Visit: Payer: Self-pay | Admitting: Family Medicine

## 2020-12-05 DIAGNOSIS — F339 Major depressive disorder, recurrent, unspecified: Secondary | ICD-10-CM

## 2020-12-20 ENCOUNTER — Telehealth: Payer: Self-pay | Admitting: Nutrition

## 2020-12-20 NOTE — Telephone Encounter (Signed)
Phone call to schedule appointment for Dexcom problems but not able to leave a message.   4:30PM:  Patient's daughter says they contacted Dexcom and got a new transmitter and have not had problems with in since then..  She was given my number to call if she has more problems.

## 2020-12-21 ENCOUNTER — Other Ambulatory Visit: Payer: Self-pay | Admitting: Family

## 2020-12-21 DIAGNOSIS — E1043 Type 1 diabetes mellitus with diabetic autonomic (poly)neuropathy: Secondary | ICD-10-CM

## 2020-12-26 ENCOUNTER — Encounter: Payer: Self-pay | Admitting: Family Medicine

## 2020-12-26 ENCOUNTER — Ambulatory Visit (INDEPENDENT_AMBULATORY_CARE_PROVIDER_SITE_OTHER): Payer: Medicare HMO | Admitting: Family Medicine

## 2020-12-26 DIAGNOSIS — F339 Major depressive disorder, recurrent, unspecified: Secondary | ICD-10-CM | POA: Diagnosis not present

## 2020-12-26 MED ORDER — MIRTAZAPINE 15 MG PO TABS: 7.5000 mg | ORAL_TABLET | Freq: Every day | ORAL | 0 refills | Status: DC

## 2020-12-26 NOTE — Progress Notes (Signed)
Virtual Visit via Telephone Note  I connected with Bridget Martinez on 12/26/20 at 10:36 AM by telephone and verified that I am speaking with the correct person using two identifiers. Bridget Martinez is currently located at home and nobody is currently with her during this visit. The provider, Loman Brooklyn, FNP is located in their office at time of visit.  I discussed the limitations, risks, security and privacy concerns of performing an evaluation and management service by telephone and the availability of in person appointments. I also discussed with the patient that there may be a patient responsible charge related to this service. The patient expressed understanding and agreed to proceed.  Subjective: PCP: Loman Brooklyn, FNP  Chief Complaint  Patient presents with  . Depression   Patient is following up on depression.  At our last visit she was weaned off of Lexapro and started on mirtazapine.  She reports she is completely off of the Lexapro and her depression is doing much better with the mirtazapine.  Her biggest complaint is that she is sleeping more.  She goes to bed at 10 PM and is now waking up at 7:30 AM.  She likes to get up at 6:00 AM.  Depression screen [facility] 2/9 12/26/2020 11/10/2020 03/12/2020  Decreased Interest 0 2 1  Down, Depressed, Hopeless '1 2 1  '$ PHQ - 2 Score '1 4 2  '$ Altered sleeping '3 2 1  '$ Tired, decreased energy '3 2 2  '$ Change in appetite 0 2 1  Feeling bad or failure about yourself  0 2 1  Trouble concentrating 0 2 1  Moving slowly or fidgety/restless 0 0 1  Suicidal thoughts 0 0 1  PHQ-9 Score '7 14 10  '$ Difficult doing work/chores Not difficult at all Very difficult Somewhat difficult  Some recent data might be hidden   GAD 7 : Generalized Anxiety Score 12/26/2020 11/10/2020 03/12/2020  Nervous, Anxious, on Edge 0 3 1  Control/stop worrying 0 2 1  Worry too much - different things 0 2 1  Trouble relaxing 0 2 1  Restless 0 0 1  Easily annoyed or irritable '1  1 1  '$ Afraid - awful might happen 0 2 1  Total GAD 7 Score '1 12 7  '$ Anxiety Difficulty Not difficult at all Very difficult Somewhat difficult    ROS: Per HPI  Current Outpatient Medications:  .  aspirin 81 MG EC tablet, Take 1 tablet (81 mg total) by mouth daily., Disp: 30 tablet, Rfl: 3 .  buPROPion (WELLBUTRIN XL) 150 MG 24 hr tablet, Take 1 tablet (150 mg total) by mouth 2 (two) times daily., Disp: 180 tablet, Rfl: 1 .  carvedilol (COREG) 6.25 MG tablet, TAKE 1 TABLET (6.25 MG TOTAL) BY MOUTH 2 TIMES DAILY WITH A MEAL. CALL FOR OFFICE VISIT [phone], Disp: 180 tablet, Rfl: 1 .  Continuous Blood Gluc Receiver ([place]) DEVI, See admin instructions., Disp: , Rfl:  .  Continuous Blood Gluc Sensor (DEXCOM G6 SENSOR) MISC, See admin instructions., Disp: , Rfl:  .  Continuous Blood Gluc Transmit (DEXCOM G6 TRANSMITTER) MISC, See admin instructions., Disp: , Rfl:  .  ENTRESTO 97-103 MG, TAKE 1 TABLET BY MOUTH TWICE A DAY, Disp: 180 tablet, Rfl: 2 .  escitalopram (LEXAPRO) 20 MG tablet, Take 1 tablet (20 mg total) by mouth daily for 7 days, THEN 0.5 tablets (10 mg total) daily for 7 days, THEN 0.5 tablets (10 mg total) every other day for 7 days.,  Disp: 15 tablet, Rfl: 0 .  insulin aspart (NOVOLOG FLEXPEN) 100 UNIT/ML FlexPen, 13 to 17 units at breakfast, 7 to 11 units at lunch and at supper, Disp: , Rfl:  .  insulin detemir (LEVEMIR FLEXPEN) 100 UNIT/ML FlexPen, Inject 22 Units into the skin in the morning AND 18 Units at bedtime., Disp: 15 mL, Rfl: 0 .  levothyroxine (SYNTHROID) 88 MCG tablet, Take 1 tablet (88 mcg total) by mouth daily., Disp: 90 tablet, Rfl: 1 .  metoCLOPramide (REGLAN) 5 MG tablet, TAKE 1 TABLET BY MOUTH 3 TIMES DAILY BEFORE MEALS., Disp: 270 tablet, Rfl: 1 .  mirtazapine (REMERON) 15 MG tablet, TAKE 1 TABLET BY MOUTH EVERYDAY AT BEDTIME, Disp: 90 tablet, Rfl: 0 .  ONE TOUCH ULTRA TEST test strip, CHECK BLOOD SUGAR UP TO 3 TIMES DAILY, Disp: , Rfl:  .   pantoprazole (PROTONIX) 40 MG tablet, Take 1 tablet (40 mg total) by mouth daily., Disp: 90 tablet, Rfl: 1 .  rosuvastatin (CRESTOR) 40 MG tablet, Take 1 tablet (40 mg total) by mouth daily., Disp: 90 tablet, Rfl: 1  Allergies  Allergen Reactions  . Penicillins Anaphylaxis and Hives    Tolerates zosyn Has patient had a PCN reaction causing immediate rash, facial/tongue/throat swelling, SOB or lightheadedness with hypotension: Yes Has patient had a PCN reaction causing severe rash involving mucus membranes or skin necrosis: No Has patient had a PCN reaction that required hospitalization: Yes Has patient had a PCN reaction occurring within the last 10 years: No If all of the above answers are "NO", then may proceed with Cephalosporin use.   . Sulfa Antibiotics Anaphylaxis and Swelling    Stiff neck also   . Macrobid [employer] Macro] Other (See Comments)    Skin peels off  . Other Other (See Comments)  . Ciprofloxacin Hcl Other (See Comments)    Skin peeling   Past Medical History:  Diagnosis Date  . Arthritis   . CHF (congestive heart failure) ([place])   . Coronary artery disease    angina.  MI.   . Depression    anxiety  . Diabetes mellitus    on insulin, with h/o DKA   . GERD (gastroesophageal reflux disease)    Hiatal hernia.   Marland Kitchen HTN (hypertension)   . Hyperlipidemia   . Hypothyroidism   . Left displaced femoral neck fracture ([place]) 11/19/2014  . Myocardial infarction [facility])    NSTEMI 07/2011 with cardiogenic shock, s/p CABG 09/2011  . Osteoporosis   . Pneumonia       . Tuberculosis   . Venous stasis ulcers (H[facility])     Observations/Objective: A&O  No respiratory distress or wheezing audible over the phone Mood, judgement, and thought processes all WNL   Assessment and Plan: 1. Recurrent depression ([place]) Well controlled on current regimen.  Decreasing dosage from 15 mg to 7.5 mg at bedtime to see if this still controls her depression but decreases, she is  sleeping. - mirtazapine (REMERON) 15 MG tablet; Take 0.5 tablets (7.5 mg total) by mouth at bedtime.  Dispense: 45 tablet; Refill: 0   Follow Up Instructions: Return in about 2 months (around 02/25/2021) for depression.  I discussed the assessment and treatment plan with the patient. The patient was provided an opportunity to ask questions and all were answered. The patient agreed with the plan and demonstrated an understanding of the instructions.   The patient was advised to call back or seek an in-person evaluation if the symptoms worsen or if  the condition fails to improve as anticipated.  The above assessment and management plan was discussed with the patient. The patient verbalized understanding of and has agreed to the management plan. Patient is aware to call the clinic if symptoms persist or worsen. Patient is aware when to return to the clinic for a follow-up visit. Patient educated on when it is appropriate to go to the emergency department.   Time call ended: 10:48 AM  I provided 12 minutes of non-face-to-face time during this encounter.  Hendricks Limes, MSN, APRN, FNP-C [facility] 12/26/20

## 2021-02-02 ENCOUNTER — Other Ambulatory Visit: Payer: Self-pay | Admitting: Family Medicine

## 2021-02-16 ENCOUNTER — Ambulatory Visit (INDEPENDENT_AMBULATORY_CARE_PROVIDER_SITE_OTHER): Payer: Medicare HMO

## 2021-02-16 VITALS — Ht 63.0 in | Wt 162.0 lb

## 2021-02-16 DIAGNOSIS — Z0000 Encounter for general adult medical examination without abnormal findings: Secondary | ICD-10-CM | POA: Diagnosis not present

## 2021-02-16 DIAGNOSIS — Z780 Asymptomatic menopausal state: Secondary | ICD-10-CM | POA: Diagnosis not present

## 2021-02-16 DIAGNOSIS — Z1231 Encounter for screening mammogram for malignant neoplasm of breast: Secondary | ICD-10-CM

## 2021-02-16 NOTE — Progress Notes (Signed)
Subjective:   Bridget Martinez is a 70 y.o. female who presents for Medicare Annual (Subsequent) preventive examination.  Virtual Visit via Telephone Note  I connected with  Bridget Martinez on 02/16/21 at  8:15 AM EDT by telephone and verified that I am speaking with the correct person using two identifiers.  Location: Patient: Home Provider: [facility] Persons participating in the virtual visit: patient/Nurse Health Advisor   I discussed the limitations, risks, security and privacy concerns of performing an evaluation and management service by telephone and the availability of in person appointments. The patient expressed understanding and agreed to proceed.  Interactive audio and video telecommunications were attempted between this nurse and patient, however failed, due to patient having technical difficulties OR patient did not have access to video capability.  We continued and completed visit with audio only.  Some vital signs may be absent or patient reported.   Zeriyah Wain E Wesly Whisenant, LPN   Review of Systems     Cardiac Risk Factors include: advanced age (>9mn, >>21women);sedentary lifestyle;diabetes mellitus;dyslipidemia;hypertension     Objective:    Today's Vitals   02/16/21 0808  Weight: 162 lb (73.5 kg)  Height: '5\' 3"'$  (1.6 m)   Body mass index is 28.7 kg/m.  Advanced Directives 02/16/2021 02/16/2020 01/21/2019 03/27/2018 03/06/2015 02/03/2015 11/19/2014  Does Patient Have a Medical Advance Directive? No Yes No No No No No  Type of Advance Directive - Living will;Healthcare Power of Attorney - - - - -  Does patient want to make changes to medical advance directive? - No - Patient declined - - - - -  Copy of HHolmesin Chart? - No - copy requested - - - - -  Would patient like information on creating a medical advance directive? Yes (MAU/Ambulatory/Procedural Areas - Information given) No - Patient declined Yes (MAU/Ambulatory/Procedural Areas - Information  given) Yes (ED - Information included in AVS) No - patient declined information No - patient declined information No - patient declined information  Pre-existing out of facility DNR order (yellow form or pink MOST form) - - - - - - -    Current Medications (verified) Outpatient Encounter Medications as of 02/16/2021  Medication Sig   aspirin 81 MG EC tablet Take 1 tablet (81 mg total) by mouth daily.   buPROPion (WELLBUTRIN XL) 150 MG 24 hr tablet Take 1 tablet (150 mg total) by mouth 2 (two) times daily.   carvedilol (COREG) 6.25 MG tablet TAKE 1 TABLET (6.25 MG TOTAL) BY MOUTH 2 TIMES DAILY WITH A MEAL. CALL FOR OFFICE VISIT [phone]  Continuous Blood Gluc Receiver (DEXCOM G6 RECEIVER) DEVI See admin instructions.   Continuous Blood Gluc Sensor (DEXCOM G6 SENSOR) MISC See admin instructions.   Continuous Blood Gluc Transmit (DEXCOM G6 TRANSMITTER) MISC See admin instructions.   ENTRESTO 97-103 MG TAKE 1 TABLET BY MOUTH TWICE A DAY   insulin aspart (NOVOLOG FLEXPEN) 100 UNIT/ML FlexPen 13 to 17 units at breakfast, 7 to 11 units at lunch and at supper   LEVEMIR FLEXTOUCH 100 UNIT/ML FlexPen INJECT 22 UNITS INTO THE SKIN IN THE MORNING AND 18 UNITS AT BEDTIME.   levothyroxine (SYNTHROID) 88 MCG tablet Take 1 tablet (88 mcg total) by mouth daily.   metoCLOPramide (REGLAN) 5 MG tablet TAKE 1 TABLET BY MOUTH 3 TIMES DAILY BEFORE MEALS.   mirtazapine (REMERON) 15 MG tablet Take 0.5 tablets (7.5 mg total) by mouth at bedtime.   pantoprazole (PROTONIX) 40 MG tablet Take  1 tablet (40 mg total) by mouth daily.   rosuvastatin (CRESTOR) 40 MG tablet Take 1 tablet (40 mg total) by mouth daily.   [DISCONTINUED] ONE TOUCH ULTRA TEST test strip CHECK BLOOD SUGAR UP TO 3 TIMES DAILY (Patient not taking: Reported on 02/16/2021)   No facility-administered encounter medications on file as of 02/16/2021.    Allergies (verified) Penicillins, Sulfa antibiotics, Macrobid [nitrofurantoin monohyd macro], Other,  and Ciprofloxacin hcl   History: Past Medical History:  Diagnosis Date   Arthritis    CHF (congestive heart failure) ([place])    Coronary artery disease    angina.  MI.    Depression    anxiety   Diabetes mellitus    on insulin, with h/o DKA    GERD (gastroesophageal reflux disease)    Hiatal hernia.    HTN (hypertension)    Hyperlipidemia    Hypothyroidism    Left displaced femoral neck fracture ([place]) 11/19/2014   Myocardial infarction [facility])    NSTEMI 07/2011 with cardiogenic shock, s/p CABG 09/2011   Osteoporosis    Pneumonia        Tuberculosis    Venous stasis ulcers ([place])    Past Surgical History:  Procedure Laterality Date   CARDIAC CATHETERIZATION     COLONOSCOPY N/A 11/18/2013   Procedure: COLONOSCOPY;  Surgeon: Lafayette Dragon, MD;  Location: [place];  Service: Endoscopy;  Laterality: N/A;   CORONARY ARTERY BYPASS GRAFT     Dr. Lucianne Lei Trigt in 09/2011    CORONARY ARTERY BYPASS GRAFT  2012   ESOPHAGOGASTRODUODENOSCOPY N/A 11/17/2013   Procedure: ESOPHAGOGASTRODUODENOSCOPY (EGD);  Surgeon: Lafayette Dragon, MD;  Location[facility] ENDOSCOPY;  Service: Endoscopy;  Laterality: N/A;   LEFT HEART CATHETERIZATION WITH CORONARY ANGIOGRAM N/A 07/26/2011   Procedure: LEFT HEART CATHETERIZATION WITH CORONARY ANGIOGRAM;  Surgeon: Larey Dresser, MD;  Location: [facility] CATH LAB;  Service: Cardiovascular;  Laterality: N/A;   RIGHT HEART CATHETERIZATION N/A 07/29/2011   Procedure: RIGHT HEART CATH;  Surgeon: Minus Breeding, MD;  Location[facility] LAB;  Service: Cardiovascular;  Laterality: N/A;   TONSILLECTOMY     TOTAL HIP ARTHROPLASTY Left 11/21/2014   Procedure: TOTAL HIP ARTHROPLASTY ANTERIOR APPROACH;  Surgeon: Mcarthur Rossetti, MD;  Location: [place];  Service: Orthopedics;  Laterality: Left;   Family History  Problem Relation Age of Onset   Heart disease Father    Multiple sclerosis Father    Hypertension Mother    Hyperthyroidism Mother    Diabetes Mother    Heart attack Paternal  Grandfather    Heart disease Paternal Grandfather    Diabetes Cousin        Multiple maternal cousins with type 2 diabetes mellitus   Diabetes Maternal Uncle        Type 1 diabetes mellitus   Healthy Daughter    Asthma Son    Diabetes Son    Social History   Socioeconomic History   Marital status: Widowed    Spouse name: Grayland Ormond; Son Rush Landmark   Number of children: 2   Years of education: 18   Highest education level: Bachelor's degree (e.g., BA, AB, BS)  Occupational History   Occupation: Retired  Tobacco Use   Smoking status: Never   Smokeless tobacco: Never  Vaping Use   Vaping Use: Never used  Substance and Sexual Activity   Alcohol use: No   Drug use: No   Sexual activity: Not on file  Other Topics Concern   Not on file  Social  History Narrative   Lives alone, suffers from depression since losing her husband. Son, Rush Landmark takes her to appointments and helps her a lot.   Social Determinants of Health   Financial Resource Strain: Low Risk    Difficulty of Paying Living Expenses: Not hard at all  Food Insecurity: No Food Insecurity   Worried About Charity fundraiser in the Last Year: Never true   [place] in the Last Year: Never true  Transportation Needs: No Transportation Needs   Lack of Transportation (Medical): No   Lack of Transportation (Non-Medical): No  Physical Activity: Inactive   Days of Exercise per Week: 0 days   Minutes of Exercise per Session: 0 min  Stress: No Stress Concern Present   Feeling of Stress : Only a little  Social Connections: Moderately Integrated   Frequency of Communication with Friends and Family: More than three times a week   Frequency of Social Gatherings with Friends and Family: More than three times a week   Attends Religious Services: 1 to 4 times per year   Active Member of [employer] or Organizations: Yes   Attends Archivist Meetings: 1 to 4 times per year   Marital Status: Widowed    Tobacco  Counseling Counseling given: Not Answered   Clinical Intake:  Pre-visit preparation completed: Yes  Pain : No/denies pain     BMI - recorded: 28.7 Nutritional Status: BMI 25 -29 Overweight Nutritional Risks: None Diabetes: Yes CBG done?: No Did pt. bring in CBG monitor from home?: No  How often do you need to have someone help you when you read instructions, pamphlets, or other written materials from your doctor or pharmacy?: 1 - Never  Nutrition Risk Assessment:  Has the patient had any N/V/D within the last 2 months?  No  Does the patient have any non-healing wounds?  No  Has the patient had any unintentional weight loss or weight gain?  No   Diabetes:  Is the patient diabetic?  Yes  If diabetic, was a CBG obtained today?  No  Did the patient bring in their glucometer from home?  No  How often do you monitor your CBG's? CGM - Dexcom.   Financial Strains and Diabetes Management:  Are you having any financial strains with the device, your supplies or your medication? No .  Does the patient want to be seen by Chronic Care Management for management of their diabetes?  No  Would the patient like to be referred to a Nutritionist or for Diabetic Management?  No   Diabetic Exams:  Diabetic Eye Exam: Completed 10/13/2018. Overdue for diabetic eye exam. Pt has been advised about the importance in completing this exam. She is going to call and make appt with Dr Zigmund Daniel  Diabetic Foot Exam: Completed 2021. Pt has been advised about the importance in completing this exam. Pt is scheduled for diabetic foot exam on next year with Dr Buddy Duty.    Interpreter Needed?: No  Information entered by :: Nika Yazzie, LPN   Activities of Daily Living In your present state of health, do you have any difficulty performing the following activities: 02/16/2021  Hearing? N  Vision? N  Difficulty concentrating or making decisions? N  Walking or climbing stairs? N  Dressing or bathing? N   Doing errands, shopping? N  Preparing Food and eating ? N  Using the Toilet? N  In the past six months, have you accidently leaked urine? N  Do you  have problems with loss of bowel control? N  Managing your Medications? N  Managing your Finances? N  Housekeeping or managing your Housekeeping? N  Some recent data might be hidden    Patient Care Team: Loman Brooklyn, FNP as PCP - General (Family Medicine) [name], Shaune Pascal, MD (Cardiology) Delrae Rend, MD as Attending Physician (Internal Medicine) Liana Gerold, MD as Consulting Physician (Nephrology) Hayden Pedro, MD as Consulting Physician (Ophthalmology)  Indicate any recent Medical Services you may have received from other than [facility] providers in the past year (date may be approximate).     Assessment:   This is a routine wellness examination for Bridget Martinez.  Hearing/Vision screen Hearing Screening - Comments:: Denies hearing difficulties  Vision Screening - Comments:: Denies vision difficulties - behind on annual eye exam with Dr Zigmund Daniel.   Dietary issues and exercise activities discussed: Current Exercise Habits: The patient does not participate in regular exercise at present, Exercise limited by: None identified   Goals Addressed             This Visit's Progress    Exercise 3x per week (30 min per time)   Not on track    02/16/2020 AWV Goal: Exercise for General Health  Patient will verbalize understanding of the benefits of increased physical activity: Exercising regularly is important. It will improve your overall fitness, flexibility, and endurance. Regular exercise also will improve your overall health. It can help you control your weight, reduce stress, and improve your bone density. Over the next year, patient will increase physical activity as tolerated with a goal of at least 150 minutes of moderate physical activity per week.  You can tell that you are exercising at a moderate intensity if  your heart starts beating faster and you start breathing faster but can still hold a conversation. Moderate-intensity exercise ideas include: Walking 1 mile (1.6 km) in about 15 minutes Biking Hiking Golfing Dancing Water aerobics Patient will verbalize understanding of everyday activities that increase physical activity by providing examples like the following: Yard work, such as: Sales promotion account executive Gardening Washing windows or floors Patient will be able to explain general safety guidelines for exercising:  Before you start a new exercise program, talk with your health care provider. Do not exercise so much that you hurt yourself, feel dizzy, or get very short of breath. Wear comfortable clothes and wear shoes with good support. Drink plenty of water while you exercise to prevent dehydration or heat stroke. Work out until your breathing and your heartbeat get faster.         Depression Screen PHQ 2/9 Scores 02/16/2021 12/26/2020 11/10/2020 03/12/2020 02/16/2020 02/16/2020 11/11/2019  PHQ - 2 Score '1 1 4 2 1 '$ 0 1  PHQ- 9 Score '5 7 14 10 '$ - - 1    Fall Risk Fall Risk  02/16/2021 11/10/2020 03/10/2020 02/16/2020 01/21/2019  Falls in the past year? 0 0 0 1 0  Number falls in past yr: 0 - - 0 -  Injury with Fall? 0 - - 0 -  Risk for fall due to : No Fall Risks - - History of fall(s) -  Follow up Falls prevention discussed - - Falls evaluation completed Falls prevention discussed    FALL RISK PREVENTION PERTAINING TO THE HOME:  Any stairs in or around the home? Yes  If so, are there any without handrails? Yes  4 steps to come  inside home without rails Home free of loose throw rugs in walkways, pet beds, electrical cords, etc? Yes  Adequate lighting in your home to reduce risk of falls? Yes   ASSISTIVE DEVICES UTILIZED TO PREVENT FALLS:  Life alert? No  Use of a cane, walker or w/c? No  Grab bars in the bathroom?  Yes  Shower chair or bench in shower? Yes  Elevated toilet seat or a handicapped toilet? Yes   TIMED UP AND GO:  Was the test performed? No . Telephonic visit  Cognitive Function:     6CIT Screen 02/16/2020 01/21/2019  What Year? 0 points 0 points  What month? 0 points 0 points  What time? 0 points 0 points  Count back from 20 0 points 2 points  Months in reverse 0 points 0 points  Repeat phrase 2 points 0 points  Total Score 2 -    Immunizations Immunization History  Administered Date(s) Administered   Influenza Split 07/27/2011, 07/29/2013, 06/13/2014   Influenza, High Dose Seasonal PF 05/23/2015, 08/14/2018, 06/23/2020   Influenza,inj,Quad PF,6+ Mos 05/26/2012, 06/07/2016   Influenza-Unspecified 06/07/2015   Moderna Sars-Covid-2 Vaccination 02/02/2020, 03/03/2020   Pneumococcal Conjugate-13 05/23/2015, 10/06/2018   Pneumococcal Polysaccharide-23 07/10/2012   Pneumococcal-Unspecified 06/07/2015   Tdap 10/06/2018    TDAP status: Up to date  Flu Vaccine status: Up to date  Pneumococcal vaccine status: Up to date  Covid-19 vaccine status: Completed vaccines  Qualifies for Shingles Vaccine? Yes   Zostavax completed No   Shingrix Completed?: No.    Education has been provided regarding the importance of this vaccine. Patient has been advised to call insurance company to determine out of pocket expense if they have not yet received this vaccine. Advised may also receive vaccine at local pharmacy or Health Dept. Verbalized acceptance and understanding.  Screening Tests Health Maintenance  Topic Date Due   Zoster Vaccines- Shingrix (1 of 2) Never done   COVID-19 Vaccine (3 - Moderna risk series) 03/31/2020   DEXA SCAN  03/10/2021 (Originally 05/16/2015)   Hepatitis C Screening  03/10/2021 (Originally 05/15/1969)   INFLUENZA VACCINE  04/09/2021   MAMMOGRAM  04/20/2021   COLONOSCOPY (Pts 45-61yr Insurance coverage will need to be confirmed)  11/19/2023   TETANUS/TDAP   10/06/2028   PNA vac Low Risk Adult  Completed   HPV VACCINES  Aged Out   FOOT EXAM  Discontinued   HEMOGLOBIN A1C  Discontinued   OPHTHALMOLOGY EXAM  Discontinued    Health Maintenance  Health Maintenance Due  Topic Date Due   Zoster Vaccines- Shingrix (1 of 2) Never done   COVID-19 Vaccine (3 - Moderna risk series) 03/31/2020    Colorectal cancer screening: Type of screening: Colonoscopy. Completed 11/18/2013. Repeat every 10 years  Mammogram status: Completed 04/21/2019. Repeat every year  Bone Density status: Ordered 02/16/21. Pt provided with contact info and advised to call to schedule appt.  Lung Cancer Screening: (Low Dose CT Chest recommended if Age 71-80years, 30 pack-year currently smoking OR have quit w/in 15years.) does not qualify.    Additional Screening:  Hepatitis C Screening: does qualify; Needs this at next visit  Vision Screening: Recommended annual ophthalmology exams for early detection of glaucoma and other disorders of the eye. Is the patient up to date with their annual eye exam?  No  Who is the provider or what is the name of the office in which the patient attends annual eye exams? [facility] DanielIf pt is not established with a provider, would they  like to be referred to a provider to establish care? No .   Dental Screening: Recommended annual dental exams for proper oral hygiene  Community Resource Referral / Chronic Care Management: CRR required this visit?  No   CCM required this visit?  No      Plan:     I have personally reviewed and noted the following in the patient's chart:   Medical and social history Use of alcohol, tobacco or illicit drugs  Current medications and supplements including opioid prescriptions.  Functional ability and status Nutritional status Physical activity Advanced directives List of other physicians Hospitalizations, surgeries, and ER visits in previous 12 months Vitals Screenings to include cognitive,  depression, and falls Referrals and appointments  In addition, I have reviewed and discussed with patient certain preventive protocols, quality metrics, and best practice recommendations. A written personalized care plan for preventive services as well as general preventive health recommendations were provided to patient.     Sandrea Hammond, LPN   [id number]   Nurse Notes: None

## 2021-02-16 NOTE — Patient Instructions (Signed)
Bridget Martinez , Thank you for taking time to come for your Medicare Wellness Visit. I appreciate your ongoing commitment to your health goals. Please review the following plan we discussed and let me know if I can assist you in the future.   Screening recommendations/referrals: Colonoscopy: Done 11/18/2013 - Repeat in 10 years Mammogram: Done 04/21/2019 - Repeat annually (ordered today) Bone Density: Due (every 2 years) ordered today Recommended yearly ophthalmology/optometry visit for glaucoma screening and checkup Recommended yearly dental visit for hygiene and checkup  Vaccinations: Influenza vaccine: Done 06/23/2020 - Repeat annually Pneumococcal vaccine: Done 07/10/2012 & 10/06/2018 Tdap vaccine: Done 10/06/2018 - Repeat in 10 years Shingles vaccine: Due  Shingrix discussed. Please contact your pharmacy for coverage information.  Covid-19: Done  02/02/2020 & 03/03/2020  Advanced directives: Advance directive discussed with you today. I have provided a copy for you to complete at home and have notarized. Once this is complete please bring a copy in to our office so we can scan it into your chart.  Conditions/risks identified: Aim for 30 minutes of exercise or brisk walking each day, drink 6-8 glasses of water and eat lots of fruits and vegetables. Remember exercise can help you feel better physically and mentally  Next appointment: Follow up in one year for your annual wellness visit    Preventive Care 71 Years and Older, Female Preventive care refers to lifestyle choices and visits with your health care provider that can promote health and wellness. What does preventive care include? A yearly physical exam. This is also called an annual well check. Dental exams once or twice a year. Routine eye exams. Ask your health care provider how often you should have your eyes checked. Personal lifestyle choices, including: Daily care of your teeth and gums. Regular physical activity. Eating a  healthy diet. Avoiding tobacco and drug use. Limiting alcohol use. Practicing safe sex. Taking low-dose aspirin every day. Taking vitamin and mineral supplements as recommended by your health care provider. What happens during an annual well check? The services and screenings done by your health care provider during your annual well check will depend on your age, overall health, lifestyle risk factors, and family history of disease. Counseling  Your health care provider may ask you questions about your: Alcohol use. Tobacco use. Drug use. Emotional well-being. Home and relationship well-being. Sexual activity. Eating habits. History of falls. Memory and ability to understand (cognition). Work and work Statistician. Reproductive health. Screening  You may have the following tests or measurements: Height, weight, and BMI. Blood pressure. Lipid and cholesterol levels. These may be checked every 5 years, or more frequently if you are over 50 years old. Skin check. Lung cancer screening. You may have this screening every year starting at age 71 if you have a 30-pack-year history of smoking and currently smoke or have quit within the past 15 years. Fecal occult blood test (FOBT) of the stool. You may have this test every year starting at age 71. Flexible sigmoidoscopy or colonoscopy. You may have a sigmoidoscopy every 5 years or a colonoscopy every 10 years starting at age 71. Hepatitis C blood test. Hepatitis B blood test. Sexually transmitted disease (STD) testing. Diabetes screening. This is done by checking your blood sugar (glucose) after you have not eaten for a while (fasting). You may have this done every 1-3 years. Bone density scan. This is done to screen for osteoporosis. You may have this done starting at age 71. Mammogram. This may be done every 1-2  years. Talk to your health care provider about how often you should have regular mammograms. Talk with your health care  provider about your test results, treatment options, and if necessary, the need for more tests. Vaccines  Your health care provider may recommend certain vaccines, such as: Influenza vaccine. This is recommended every year. Tetanus, diphtheria, and acellular pertussis (Tdap, Td) vaccine. You may need a Td booster every 10 years. Zoster vaccine. You may need this after age 71. Pneumococcal 13-valent conjugate (PCV13) vaccine. One dose is recommended after age 71. Pneumococcal polysaccharide (PPSV23) vaccine. One dose is recommended after age 71. Talk to your health care provider about which screenings and vaccines you need and how often you need them. This information is not intended to replace advice given to you by your health care provider. Make sure you discuss any questions you have with your health care provider. Document Released: 09/22/2015 Document Revised: 05/15/2016 Document Reviewed: 06/27/2015 Elsevier Interactive Patient Education  2017 [place] Prevention in the Home Falls can cause injuries. They can happen to people of all ages. There are many things you can do to make your home safe and to help prevent falls. What can I do on the outside of my home? Regularly fix the edges of walkways and driveways and fix any cracks. Remove anything that might make you trip as you walk through a door, such as a raised step or threshold. Trim any bushes or trees on the path to your home. Use bright outdoor lighting. Clear any walking paths of anything that might make someone trip, such as rocks or tools. Regularly check to see if handrails are loose or broken. Make sure that both sides of any steps have handrails. Any raised decks and porches should have guardrails on the edges. Have any leaves, snow, or ice cleared regularly. Use sand or salt on walking paths during winter. Clean up any spills in your garage right away. This includes oil or grease spills. What can I do in the  bathroom? Use night lights. Install grab bars by the toilet and in the tub and shower. Do not use towel bars as grab bars. Use non-skid mats or decals in the tub or shower. If you need to sit down in the shower, use a plastic, non-slip stool. Keep the floor dry. Clean up any water that spills on the floor as soon as it happens. Remove soap buildup in the tub or shower regularly. Attach bath mats securely with double-sided non-slip rug tape. Do not have throw rugs and other things on the floor that can make you trip. What can I do in the bedroom? Use night lights. Make sure that you have a light by your bed that is easy to reach. Do not use any sheets or blankets that are too big for your bed. They should not hang down onto the floor. Have a firm chair that has side arms. You can use this for support while you get dressed. Do not have throw rugs and other things on the floor that can make you trip. What can I do in the kitchen? Clean up any spills right away. Avoid walking on wet floors. Keep items that you use a lot in easy-to-reach places. If you need to reach something above you, use a strong step stool that has a grab bar. Keep electrical cords out of the way. Do not use floor polish or wax that makes floors slippery. If you must use wax, use non-skid floor  wax. Do not have throw rugs and other things on the floor that can make you trip. What can I do with my stairs? Do not leave any items on the stairs. Make sure that there are handrails on both sides of the stairs and use them. Fix handrails that are broken or loose. Make sure that handrails are as long as the stairways. Check any carpeting to make sure that it is firmly attached to the stairs. Fix any carpet that is loose or worn. Avoid having throw rugs at the top or bottom of the stairs. If you do have throw rugs, attach them to the floor with carpet tape. Make sure that you have a light switch at the top of the stairs and the  bottom of the stairs. If you do not have them, ask someone to add them for you. What else can I do to help prevent falls? Wear shoes that: Do not have high heels. Have rubber bottoms. Are comfortable and fit you well. Are closed at the toe. Do not wear sandals. If you use a stepladder: Make sure that it is fully opened. Do not climb a closed stepladder. Make sure that both sides of the stepladder are locked into place. Ask someone to hold it for you, if possible. Clearly mark and make sure that you can see: Any grab bars or handrails. First and last steps. Where the edge of each step is. Use tools that help you move around (mobility aids) if they are needed. These include: Canes. Walkers. Scooters. Crutches. Turn on the lights when you go into a dark area. Replace any light bulbs as soon as they burn out. Set up your furniture so you have a clear path. Avoid moving your furniture around. If any of your floors are uneven, fix them. If there are any pets around you, be aware of where they are. Review your medicines with your doctor. Some medicines can make you feel dizzy. This can increase your chance of falling. Ask your doctor what other things that you can do to help prevent falls. This information is not intended to replace advice given to you by your health care provider. Make sure you discuss any questions you have with your health care provider. Document Released: 06/22/2009 Document Revised: 02/01/2016 Document Reviewed: 09/30/2014 Elsevier Interactive Patient Education  2017 [place].  Diabetes Mellitus and Exercise Exercising regularly is important for overall health, especially for people who have diabetes mellitus. Exercising is not only about losing weight. It has many other health benefits, such as increasing muscle strength and bone density and reducing body fat and stress. This leads to improved fitness, flexibility, andendurance, all of which result in better  overall health. What are the benefits of exercise if I have diabetes? Exercise has many benefits for people with diabetes. They include: Helping to lower and control blood sugar (glucose). Helping the body to respond better to the hormone insulin by improving insulin sensitivity. Reducing how much insulin the body needs. Lowering the risk for heart disease by: Lowering "bad" cholesterol and triglyceride levels. Increasing "good" cholesterol levels. Lowering blood pressure. Lowering blood glucose levels. What is my activity plan? Your health care provider or certified diabetes educator can help you make a plan for the type and frequency of exercise that works for you. This is called your activity plan. Be sure to: Get at least 150 minutes of medium-intensity or high-intensity exercise each week. Exercises may include brisk walking, biking, or water aerobics. Do stretching and  strengthening exercises, such as yoga or weight lifting, at least 2 times a week. Spread out your activity over at least 3 days of the week. Get some form of physical activity each day. Do not go more than 2 days in a row without some kind of physical activity. Avoid being inactive for more than 90 minutes at a time. Take frequent breaks to walk or stretch. Choose exercises or activities that you enjoy. Set realistic goals. Start slowly and gradually increase your exercise intensity over time. How do I manage my diabetes during exercise?  Monitor your blood glucose Check your blood glucose before and after exercising. If your blood glucose is: 240 mg/dL (13.3 mmol/L) or higher before you exercise, check your urine for ketones. These are chemicals created by the liver. If you have ketones in your urine, do not exercise until your blood glucose returns to normal. 100 mg/dL (5.6 mmol/L) or lower, eat a snack containing 15-20 grams of carbohydrate. Check your blood glucose 15 minutes after the snack to make sure that your  glucose level is above 100 mg/dL (5.6 mmol/L) before you start your exercise. Know the symptoms of low blood glucose (hypoglycemia) and how to treat it. Your risk for hypoglycemia increases during and after exercise. Follow these tips and your health care provider's instructions Keep a carbohydrate snack that is fast-acting for use before, during, and after exercise to help prevent or treat hypoglycemia. Avoid injecting insulin into areas of the body that are going to be exercised. For example, avoid injecting insulin into: Your arms, when you are about to play tennis. Your legs, when you are about to go jogging. Keep records of your exercise habits. Doing this can help you and your health care provider adjust your diabetes management plan as needed. Write down: Food that you eat before and after you exercise. Blood glucose levels before and after you exercise. The type and amount of exercise you have done. Work with your health care provider when you start a new exercise or activity. He or she may need to: Make sure that the activity is safe for you. Adjust your insulin, other medicines, and food that you eat. Drink plenty of water while you exercise. This prevents loss of water (dehydration) and problems caused by a lot of heat in the body (heat stroke). Where to find more information American Diabetes Association: www.diabetes.org Summary Exercising regularly is important for overall health, especially for people who have diabetes mellitus. Exercising has many health benefits. It increases muscle strength and bone density and reduces body fat and stress. It also lowers and controls blood glucose. Your health care provider or certified diabetes educator can help you make an activity plan for the type and frequency of exercise that works for you. Work with your health care provider to make sure any new activity is safe for you. Also work with your health care provider to adjust your insulin,  other medicines, and the food you eat. This information is not intended to replace advice given to you by your health care provider. Make sure you discuss any questions you have with your healthcare provider. Document Revised: 05/24/2019 Document Reviewed: 05/24/2019 Elsevier Patient Education  [place].

## 2021-02-27 ENCOUNTER — Ambulatory Visit: Payer: Medicare HMO | Admitting: Family Medicine

## 2021-03-04 ENCOUNTER — Other Ambulatory Visit: Payer: Self-pay | Admitting: Family Medicine

## 2021-03-04 DIAGNOSIS — E039 Hypothyroidism, unspecified: Secondary | ICD-10-CM

## 2021-03-04 DIAGNOSIS — F339 Major depressive disorder, recurrent, unspecified: Secondary | ICD-10-CM

## 2021-03-05 ENCOUNTER — Ambulatory Visit (INDEPENDENT_AMBULATORY_CARE_PROVIDER_SITE_OTHER): Payer: Medicare HMO | Admitting: Family Medicine

## 2021-03-05 ENCOUNTER — Encounter: Payer: Self-pay | Admitting: Family Medicine

## 2021-03-05 ENCOUNTER — Other Ambulatory Visit: Payer: Self-pay

## 2021-03-05 DIAGNOSIS — IMO0002 Reserved for concepts with insufficient information to code with codable children: Secondary | ICD-10-CM

## 2021-03-05 DIAGNOSIS — F339 Major depressive disorder, recurrent, unspecified: Secondary | ICD-10-CM

## 2021-03-05 DIAGNOSIS — E1049 Type 1 diabetes mellitus with other diabetic neurological complication: Secondary | ICD-10-CM | POA: Diagnosis not present

## 2021-03-05 DIAGNOSIS — E1065 Type 1 diabetes mellitus with hyperglycemia: Secondary | ICD-10-CM

## 2021-03-05 DIAGNOSIS — N1832 Chronic kidney disease, stage 3b: Secondary | ICD-10-CM

## 2021-03-05 DIAGNOSIS — E785 Hyperlipidemia, unspecified: Secondary | ICD-10-CM

## 2021-03-05 DIAGNOSIS — E1159 Type 2 diabetes mellitus with other circulatory complications: Secondary | ICD-10-CM

## 2021-03-05 DIAGNOSIS — I5042 Chronic combined systolic (congestive) and diastolic (congestive) heart failure: Secondary | ICD-10-CM

## 2021-03-05 DIAGNOSIS — E1069 Type 1 diabetes mellitus with other specified complication: Secondary | ICD-10-CM

## 2021-03-05 DIAGNOSIS — E039 Hypothyroidism, unspecified: Secondary | ICD-10-CM

## 2021-03-05 DIAGNOSIS — I152 Hypertension secondary to endocrine disorders: Secondary | ICD-10-CM

## 2021-03-05 MED ORDER — MIRTAZAPINE 15 MG PO TABS: 15.0000 mg | ORAL_TABLET | Freq: Every day | ORAL | 1 refills | Status: DC

## 2021-03-05 MED ORDER — BUPROPION HCL ER (XL) 150 MG PO TB24: 150.0000 mg | ORAL_TABLET | Freq: Two times a day (BID) | ORAL | 1 refills | Status: DC

## 2021-03-05 NOTE — Progress Notes (Signed)
Virtual Visit via Telephone Note  I connected with Bridget Martinez on 03/05/21 at 2:45 PM by telephone and verified that I am speaking with the correct person using two identifiers. Bridget Martinez is currently located at home and nobody is currently with her during this visit. The provider, Loman Brooklyn, FNP is located in their home at time of visit.  I discussed the limitations, risks, security and privacy concerns of performing an evaluation and management service by telephone and the availability of in person appointments. I also discussed with the patient that there may be a patient responsible charge related to this service. The patient expressed understanding and agreed to proceed.  Subjective: PCP: Loman Brooklyn, FNP  Chief Complaint  Patient presents with   Depression   Patient is following up on depression.  At our last visit mirtazapine was decreased from 15 mg to 7.5 mg because the patient felt like she was sleeping too much.  She reports today that she increased her dosage back up to 15 mg because she started crying all the time when the dose was decreased.  She reports the sleeping is getting better.  She would like to remain on her current dosage.  Depression screen [facility] 2/9 03/05/2021 02/16/2021 12/26/2020  Decreased Interest 1 0 0  Down, Depressed, Hopeless 0 1 1  PHQ - 2 Score _0 Altered sleeping _1 Tired, decreased energy _2 Change in appetite 0 0 0  Feeling bad or failure about yourself  0 0 0  Trouble concentrating 0 0 0  Moving slowly or fidgety/restless 1 0 0  Suicidal thoughts 0 0 0  PHQ-9 Score _3 Difficult doing work/chores Not difficult at all Somewhat difficult Not difficult at all  Some recent data might be hidden    ROS: Per HPI  Current Outpatient Medications:    aspirin 81 MG EC tablet, Take 1 tablet (81 mg total) by mouth daily., Disp: 30 tablet, Rfl: 3   buPROPion (WELLBUTRIN XL) 150 MG 24 hr tablet, TAKE 1 TABLET BY MOUTH  TWICE A DAY, Disp: 180 tablet, Rfl: 0   carvedilol (COREG) 6.25 MG tablet, TAKE 1 TABLET (6.25 MG TOTAL) BY MOUTH 2 TIMES DAILY WITH A MEAL. CALL FOR OFFICE VISIT [phone], Disp: 180 tablet, Rfl: 1   Continuous Blood Gluc Receiver ([place]rest) DEVI, See admin instructions., Disp: , Rfl:    Continuous Blood Gluc Sensor (DEXCOM G6 SENSOR) MISC, See admin instructions., Disp: , Rfl:    Continuous Blood Gluc Transmit (DEXCOM G6 TRANSMITTER) MISC, See admin instructions., Disp: , Rfl:    ENTRESTO 97-103 MG, TAKE 1 TABLET BY MOUTH TWICE A DAY, Disp: 180 tablet, Rfl: 2   insulin aspart (NOVOLOG FLEXPEN) 100 UNIT/ML FlexPen, 13 to 17 units at breakfast, 7 to 11 units at lunch and at supper, Disp: , Rfl:    LEVEMIR FLEXTOUCH 100 UNIT/ML FlexPen, INJECT 22 UNITS INTO THE SKIN IN THE MORNING AND 18 UNITS AT BEDTIME., Disp: 9 mL, Rfl: 0   levothyroxine (SYNTHROID) 88 MCG tablet, Take 1 tablet (88 mcg total) by mouth daily. (NEEDS TO BE SEEN BEFORE NEXT REFILL), Disp: 30 tablet, Rfl: 0   metoCLOPramide (REGLAN) 5 MG tablet, TAKE 1 TABLET BY MOUTH 3 TIMES DAILY BEFORE MEALS., Disp: 270 tablet, Rfl: 1   mirtazapine (REMERON) 15 MG tablet, Take 0.5 tablets (7.5 mg total) by mouth at bedtime., Disp: 45 tablet, Rfl: 0  pantoprazole (PROTONIX) 40 MG tablet, Take 1 tablet (40 mg total) by mouth daily., Disp: 90 tablet, Rfl: 1   rosuvastatin (CRESTOR) 40 MG tablet, Take 1 tablet (40 mg total) by mouth daily., Disp: 90 tablet, Rfl: 1  Allergies  Allergen Reactions   Penicillins Anaphylaxis and Hives    Tolerates zosyn Has patient had a PCN reaction causing immediate rash, facial/tongue/throat swelling, SOB or lightheadedness with hypotension: Yes Has patient had a PCN reaction causing severe rash involving mucus membranes or skin necrosis: No Has patient had a PCN reaction that required hospitalization: Yes Has patient had a PCN reaction occurring within the last 10 years: No If all of the above answers  are "NO", then may proceed with Cephalosporin use.    Sulfa Antibiotics Anaphylaxis and Swelling    Stiff neck also    Macrobid [Nitrofurantoin Monohyd Macro] Other (See Comments)    Skin peels off   Other Other (See Comments)   Ciprofloxacin Hcl Other (See Comments)    Skin peeling   Past Medical History:  Diagnosis Date   Arthritis    CHF (congestive heart failure) ([place])    Coronary artery disease    angina.  MI.    Depression    anxiety   Diabetes mellitus    on insulin, with h/o DKA    GERD (gastroesophageal reflux disease)    Hiatal hernia.    HTN (hypertension)    Hyperlipidemia    Hypothyroidism    Left displaced femoral neck fracture ([place]) 11/19/2014   Myocardial infarction [facility])    NSTEMI 07/2011 with cardiogenic shock, s/p CABG 09/2011   Osteoporosis    Pneumonia        Tuberculosis    Venous stasis ulcers ([facility])     Observations/Objective: A&O  No respiratory distress or wheezing audible over the phone Mood, judgement, and thought processes all WNL   Assessment and Plan: 1. Recurrent depression ([place]) Well controlled on current regimen.  - CMP14+EGFR; Future - buPROPion (WELLBUTRIN XL) 150 MG 24 hr tablet; Take 1 tablet (150 mg total) by mouth 2 (two) times daily.  Dispense: 180 tablet; Refill: 1 - mirtazapine (REMERON) 15 MG tablet; Take 1 tablet (15 mg total) by mouth at bedtime.  Dispense: 90 tablet; Refill: 1  2. Stage 3b chronic kidney disease ([place]) Managed by nephrology. - CMP14+EGFR; Future  3. Hyperlipidemia due to type 1 diabetes mellitus ([place]) Labs to assess. - CMP14+EGFR; Future - Lipid panel; Future  4. DM (diabetes mellitus), type 1, uncontrolled w/neurologic complication ([place]) Managed by Dr. Buddy Duty. - CBC with Differential/Platelet; Future - CMP14+EGFR; Future - Lipid panel; Future - Bayer DCA Hb A1c Waived; Future  5. Acquired hypothyroidism Labs to assess. - TSH; Future - T4, free; Future  6. Hypertension associated with  diabetes ([place]) Well controlled on current regimen.  - CBC with Differential/Platelet; Future - CMP14+EGFR; Future - Lipid panel; Future  7. Chronic combined systolic and diastolic congestive heart failure ([place]) Managed by cardiology.  - CBC with Differential/Platelet; Future - CMP14+EGFR; Future - Lipid panel; Future   Follow Up Instructions: Return in about 3 months (around 06/05/2021) for annual physical.  I discussed the assessment and treatment plan with the patient. The patient was provided an opportunity to ask questions and all were answered. The patient agreed with the plan and demonstrated an understanding of the instructions.   The patient was advised to call back or seek an in-person evaluation if the symptoms worsen or if the condition fails to  improve as anticipated.  The above assessment and management plan was discussed with the patient. The patient verbalized understanding of and has agreed to the management plan. Patient is aware to call the clinic if symptoms persist or worsen. Patient is aware when to return to the clinic for a follow-up visit. Patient educated on when it is appropriate to go to the emergency department.   Time call ended: 3:00 PM  I provided 15 minutes of non-face-to-face time during this encounter.  Hendricks Limes, MSN, APRN, FNP-C [facility] 03/05/21

## 2021-03-16 ENCOUNTER — Other Ambulatory Visit: Payer: Self-pay

## 2021-03-16 ENCOUNTER — Other Ambulatory Visit: Payer: Medicare HMO

## 2021-03-16 DIAGNOSIS — F339 Major depressive disorder, recurrent, unspecified: Secondary | ICD-10-CM

## 2021-03-16 DIAGNOSIS — E039 Hypothyroidism, unspecified: Secondary | ICD-10-CM

## 2021-03-16 DIAGNOSIS — E1049 Type 1 diabetes mellitus with other diabetic neurological complication: Secondary | ICD-10-CM

## 2021-03-16 DIAGNOSIS — N1832 Chronic kidney disease, stage 3b: Secondary | ICD-10-CM

## 2021-03-16 DIAGNOSIS — I152 Hypertension secondary to endocrine disorders: Secondary | ICD-10-CM

## 2021-03-16 DIAGNOSIS — I5042 Chronic combined systolic (congestive) and diastolic (congestive) heart failure: Secondary | ICD-10-CM

## 2021-03-16 DIAGNOSIS — IMO0002 Reserved for concepts with insufficient information to code with codable children: Secondary | ICD-10-CM

## 2021-03-16 DIAGNOSIS — E1069 Type 1 diabetes mellitus with other specified complication: Secondary | ICD-10-CM

## 2021-03-16 LAB — BAYER DCA HB A1C WAIVED: HB A1C (BAYER DCA - WAIVED): 10.1 % — ABNORMAL HIGH (ref <7.0)

## 2021-03-17 LAB — CMP14+EGFR
ALT: 10 IU/L (ref 0–32)
AST: 11 IU/L (ref 0–40)
Albumin/Globulin Ratio: 2.1 (ref 1.2–2.2)
Albumin: 4.2 g/dL (ref 3.8–4.8)
Alkaline Phosphatase: 108 IU/L (ref 44–121)
BUN/Creatinine Ratio: 19 (ref 12–28)
BUN: 35 mg/dL — ABNORMAL HIGH (ref 8–27)
Bilirubin Total: 0.3 mg/dL (ref 0.0–1.2)
CO2: 20 mmol/L (ref 20–29)
Calcium: 9.1 mg/dL (ref 8.7–10.3)
Chloride: 98 mmol/L (ref 96–106)
Creatinine, Ser: 1.85 mg/dL — ABNORMAL HIGH (ref 0.57–1.00)
Globulin, Total: 2 g/dL (ref 1.5–4.5)
Glucose: 326 mg/dL — ABNORMAL HIGH (ref 65–99)
Potassium: 4.5 mmol/L (ref 3.5–5.2)
Sodium: 139 mmol/L (ref 134–144)
Total Protein: 6.2 g/dL (ref 6.0–8.5)
eGFR: 29 mL/min/{1.73_m2} — ABNORMAL LOW (ref >59)

## 2021-03-17 LAB — CBC WITH DIFFERENTIAL/PLATELET
Basophils Absolute: 0 10*3/uL (ref 0.0–0.2)
Basos: 1 %
EOS (ABSOLUTE): 0.2 10*3/uL (ref 0.0–0.4)
Eos: 3 %
Hematocrit: 30.5 % — ABNORMAL LOW (ref 34.0–46.6)
Hemoglobin: 9.7 g/dL — ABNORMAL LOW (ref 11.1–15.9)
Immature Grans (Abs): 0 10*3/uL (ref 0.0–0.1)
Immature Granulocytes: 0 %
Lymphocytes Absolute: 1.4 10*3/uL (ref 0.7–3.1)
Lymphs: 17 %
MCH: 28.1 pg (ref 26.6–33.0)
MCHC: 31.8 g/dL (ref 31.5–35.7)
MCV: 88 fL (ref 79–97)
Monocytes Absolute: 0.5 10*3/uL (ref 0.1–0.9)
Monocytes: 7 %
Neutrophils Absolute: 5.7 10*3/uL (ref 1.4–7.0)
Neutrophils: 72 %
Platelets: 259 10*3/uL (ref 150–450)
RBC: 3.45 x10E6/uL — ABNORMAL LOW (ref 3.77–5.28)
RDW: 14.5 % (ref 11.7–15.4)
WBC: 7.9 10*3/uL (ref 3.4–10.8)

## 2021-03-17 LAB — LIPID PANEL
Chol/HDL Ratio: 2 ratio (ref 0.0–4.4)
Cholesterol, Total: 131 mg/dL (ref 100–199)
HDL: 64 mg/dL (ref >39)
LDL Chol Calc (NIH): 53 mg/dL (ref 0–99)
Triglycerides: 71 mg/dL (ref 0–149)
VLDL Cholesterol Cal: 14 mg/dL (ref 5–40)

## 2021-03-17 LAB — T4, FREE: Free T4: 1.6 ng/dL (ref 0.82–1.77)

## 2021-03-17 LAB — TSH: TSH: 1.69 u[IU]/mL (ref 0.450–4.500)

## 2021-03-18 ENCOUNTER — Other Ambulatory Visit: Payer: Self-pay | Admitting: Family Medicine

## 2021-03-18 DIAGNOSIS — F339 Major depressive disorder, recurrent, unspecified: Secondary | ICD-10-CM

## 2021-03-19 NOTE — Telephone Encounter (Signed)
03/05/21 refill was on no print, sent refill

## 2021-03-23 ENCOUNTER — Encounter (HOSPITAL_COMMUNITY): Payer: Medicare HMO | Admitting: Internal Medicine

## 2021-05-01 ENCOUNTER — Other Ambulatory Visit: Payer: Self-pay | Admitting: Family Medicine

## 2021-05-01 DIAGNOSIS — Z1231 Encounter for screening mammogram for malignant neoplasm of breast: Secondary | ICD-10-CM

## 2021-05-09 ENCOUNTER — Other Ambulatory Visit: Payer: Medicare HMO

## 2021-05-28 ENCOUNTER — Other Ambulatory Visit: Payer: Self-pay | Admitting: Family Medicine

## 2021-05-28 DIAGNOSIS — E039 Hypothyroidism, unspecified: Secondary | ICD-10-CM

## 2021-05-29 ENCOUNTER — Other Ambulatory Visit: Payer: Self-pay | Admitting: Family Medicine

## 2021-05-29 DIAGNOSIS — E1043 Type 1 diabetes mellitus with diabetic autonomic (poly)neuropathy: Secondary | ICD-10-CM

## 2021-05-29 DIAGNOSIS — R110 Nausea: Secondary | ICD-10-CM

## 2021-06-01 ENCOUNTER — Telehealth: Payer: Self-pay

## 2021-06-01 NOTE — Telephone Encounter (Signed)
Called patient to schedule appt for DXA - left message to call back.

## 2021-06-03 ENCOUNTER — Other Ambulatory Visit: Payer: Self-pay | Admitting: Family Medicine

## 2021-06-03 DIAGNOSIS — E785 Hyperlipidemia, unspecified: Secondary | ICD-10-CM

## 2021-06-03 DIAGNOSIS — K219 Gastro-esophageal reflux disease without esophagitis: Secondary | ICD-10-CM

## 2021-06-03 DIAGNOSIS — E1069 Type 1 diabetes mellitus with other specified complication: Secondary | ICD-10-CM

## 2021-06-11 ENCOUNTER — Other Ambulatory Visit (HOSPITAL_COMMUNITY): Payer: Self-pay

## 2021-06-11 ENCOUNTER — Ambulatory Visit (HOSPITAL_COMMUNITY)
Admission: RE | Admit: 2021-06-11 | Discharge: 2021-06-11 | Disposition: A | Discharge status: Home / Self Care | Payer: Medicare HMO | Source: Ambulatory Visit | Attending: Internal Medicine | Admitting: Internal Medicine

## 2021-06-11 ENCOUNTER — Other Ambulatory Visit: Payer: Self-pay

## 2021-06-11 ENCOUNTER — Encounter (HOSPITAL_COMMUNITY): Payer: Self-pay | Admitting: Internal Medicine

## 2021-06-11 ENCOUNTER — Ambulatory Visit (HOSPITAL_BASED_OUTPATIENT_CLINIC_OR_DEPARTMENT_OTHER)
Admission: RE | Admit: 2021-06-11 | Discharge: 2021-06-11 | Disposition: A | Discharge status: Home / Self Care | Payer: Medicare HMO | Source: Ambulatory Visit | Attending: Internal Medicine | Admitting: Internal Medicine

## 2021-06-11 VITALS — BP 148/78 | HR 62 | Wt 170.6 lb

## 2021-06-11 DIAGNOSIS — I110 Hypertensive heart disease with heart failure: Secondary | ICD-10-CM | POA: Insufficient documentation

## 2021-06-11 DIAGNOSIS — I340 Nonrheumatic mitral (valve) insufficiency: Secondary | ICD-10-CM

## 2021-06-11 DIAGNOSIS — I5042 Chronic combined systolic (congestive) and diastolic (congestive) heart failure: Secondary | ICD-10-CM | POA: Diagnosis not present

## 2021-06-11 DIAGNOSIS — E119 Type 2 diabetes mellitus without complications: Secondary | ICD-10-CM | POA: Insufficient documentation

## 2021-06-11 DIAGNOSIS — I2510 Atherosclerotic heart disease of native coronary artery without angina pectoris: Secondary | ICD-10-CM

## 2021-06-11 DIAGNOSIS — N184 Chronic kidney disease, stage 4 (severe): Secondary | ICD-10-CM | POA: Diagnosis not present

## 2021-06-11 DIAGNOSIS — E785 Hyperlipidemia, unspecified: Secondary | ICD-10-CM | POA: Insufficient documentation

## 2021-06-11 DIAGNOSIS — I080 Rheumatic disorders of both mitral and aortic valves: Secondary | ICD-10-CM | POA: Diagnosis not present

## 2021-06-11 LAB — ECHOCARDIOGRAM COMPLETE
Area-P 1/2: 3.83 cm2
S' Lateral: 4.7 cm

## 2021-06-11 NOTE — Addendum Note (Signed)
Encounter addended by: Scarlette Calico, RN on: 06/11/2021 10:58 AM  Actions taken: Order list changed, Clinical Note Signed

## 2021-06-11 NOTE — Patient Instructions (Signed)
Your physician recommends that you schedule a follow-up appointment in: 1 year (October 2023), **PLEASE CALL OUR OFFICE IN September TO SCHEDULE THIS APPOINTMENT  Do the following things EVERYDAY: Weigh yourself in the morning before breakfast. Write it down and keep it in a log. Take your medicines as prescribed Eat low salt foods--Limit salt (sodium) to 2000 mg per day.  Stay as active as you can everyday Limit all fluids for the day to less than 2 liters  If you have any questions or concerns before your next appointment please send [place] a message through [place] or call our office at [phone].    TO LEAVE A MESSAGE FOR THE NURSE SELECT OPTION 2, PLEASE LEAVE A MESSAGE INCLUDING: YOUR NAME DATE OF BIRTH CALL BACK NUMBER REASON FOR CALL**this is important as we prioritize the call backs  YOU WILL RECEIVE A CALL BACK THE SAME DAY AS LONG AS YOU CALL BEFORE 4:00 PM  At the [facility], you and your health needs are our priority. As part of our continuing mission to provide you with exceptional heart care, we have created designated Provider Care Teams. These Care Teams include your primary Cardiologist (physician) and Advanced Practice Providers (APPs- Physician Assistants and Nurse Practitioners) who all work together to provide you with the care you need, when you need it.   You may see any of the following providers on your designated Care Team at your next follow up: Dr Glori Bickers Dr Loralie Champagne Dr Patrice Paradise, NP Lyda Jester, [place] Ginnie Smart Audry Riles, PharmD   Please be sure to bring in all your medications bottles to every appointment.

## 2021-06-11 NOTE — Progress Notes (Signed)
[place] VISIT  Patient ID: Bridget Martinez, female   DOB: 1951/08/15, 70 y.o.   MRN: [id number] PCP: Loman Brooklyn, FNP Nephrologist: Dr. Rory Percy Endocrinologist: Dr Buddy Duty  HPI: Bridget Martinez is a 70 yo woman with DM1, HTN, hypothyroidism, CAD s/p CABG x 4 (09/2011), depression and CHF due to ICM with EF 30-35%  Admitted 12/14 for DKA  She has had multiple admissions for gastroparesis and hyperglycemia.  In 2/16, she was re-admitted with DKA, n/v, gastroparesis, and NSTEMI. Evaluated by Dr Burt Knack and not felt to be cath candidate.  She as noted to have intermittent LBBB.    Here for routine f/u. Says she is doing "ok". Able to get around the house without CP or SOB. Using Dexcom for her DM2 but last HgBa1c > 10%. No edema, orthopnea or PND. Her husband past away last year.  Echo today 06/11/21 EF 30-35% inferior AK mod MR G2 DD Personally reviewed   Echo 10/20 EF 30-35% (stable) mild-mod MR  Echo 05/26/18: EF 30-35% inferior AK mod MR grade II DD Personally reviewed  Echo 01/2016 EF 30-35% inferior AK    Review of systems complete and found to be negative unless listed in HPI.   Past Medical History:  Diagnosis Date   Arthritis    CHF (congestive heart failure) (HCC)    Coronary artery disease    angina.  MI.    Depression    anxiety   Diabetes mellitus    on insulin, with h/o DKA    GERD (gastroesophageal reflux disease)    Hiatal hernia.    HTN (hypertension)    Hyperlipidemia    Hypothyroidism    Left displaced femoral neck fracture ([place]) 11/19/2014   Myocardial infarction [facility])    NSTEMI 07/2011 with cardiogenic shock, s/p CABG 09/2011   Osteoporosis    Pneumonia        Tuberculosis    Venous stasis ulcers (HCC)     Current Outpatient Medications  Medication Sig Dispense Refill   aspirin 81 MG EC tablet Take 1 tablet (81 mg total) by mouth daily. 30 tablet 3   buPROPion (WELLBUTRIN XL) 150 MG 24 hr tablet Take 1 tablet (150 mg total) by mouth  2 (two) times daily. 180 tablet 1   carvedilol (COREG) 12.5 MG tablet Take 12.5 mg by mouth 2 (two) times daily with a meal.     Continuous Blood Gluc Receiver ([place]) DEVI See admin instructions.     Continuous Blood Gluc Sensor (DEXCOM G6 SENSOR) MISC See admin instructions.     ENTRESTO 97-103 MG TAKE 1 TABLET BY MOUTH TWICE A DAY 180 tablet 2   insulin aspart (NOVOLOG FLEXPEN) 100 UNIT/ML FlexPen 13 to 17 units at breakfast, 7 to 11 units at lunch and at supper     LEVEMIR FLEXTOUCH 100 UNIT/ML FlexPen INJECT 22 UNITS INTO THE SKIN IN THE MORNING AND 18 UNITS AT BEDTIME. 9 mL 0   levothyroxine (SYNTHROID) 88 MCG tablet TAKE 1 TABLET BY MOUTH EVERY DAY 90 tablet 3   metoCLOPramide (REGLAN) 5 MG tablet TAKE 1 TABLET BY MOUTH 3 TIMES DAILY BEFORE MEALS 270 tablet 1   mirtazapine (REMERON) 15 MG tablet TAKE 1 TABLET BY MOUTH EVERYDAY AT BEDTIME 90 tablet 1   pantoprazole (PROTONIX) 40 MG tablet TAKE 1 TABLET BY MOUTH EVERY DAY 90 tablet 0   rosuvastatin (CRESTOR) 40 MG tablet TAKE 1 TABLET BY MOUTH EVERY DAY 90 tablet 0   No  current facility-administered medications for this encounter.    Vitals:   06/11/21 1031  BP: (!) 148/78  Pulse: 62  SpO2: 100%  Weight: 77.4 kg (170 lb 9.6 oz)    Wt Readings from Last 3 Encounters:  06/11/21 77.4 kg (170 lb 9.6 oz)  02/16/21 73.5 kg (162 lb)  11/10/20 69.9 kg (154 lb)    PHYSICAL EXAM:  General:  Elderly No resp difficulty HEENT: normal Neck: supple. no JVD. Carotids 2+ bilat; no bruits. No lymphadenopathy or thryomegaly appreciated. Cor: PMI nondisplaced. Regular rate & rhythm. No rubs, gallops or murmurs. Lungs: clear Abdomen: soft, nontender, nondistended. No hepatosplenomegaly. No bruits or masses. Good bowel sounds. Extremities: no cyanosis, clubbing, rash, edema Neuro: alert & orientedx3, cranial nerves grossly intact. moves all 4 extremities w/o difficulty. Affect pleasant   ASSESSMENT & PLAN:   1) Chronic systolic  HF: Ischemic cardiomyopathy.  - Echo 01/2016 EF 35% (stable)RV normal.   - Echo today 06/11/21 EF stable at 30-35% Mod MR Personally reviewed - Stable NYHA II-III - Volume status looks good. Hasn't needed lasix - Continue Coreg 12.5 mg BID. Recently increased by Nephrology - Continue Entresto 97/103 mg BID.   - Off spiro due to hyperkalemia  - Not candidate for SGLT2i due to DM1 - We discussed ICD again and she is not interested 2) HTN:  - Blood pressure well controlled at home though a bit high here. Continue current regimen. 3) HLD:  - Continue rosuvastatin. Followed by [facility]. Goal LDL < 70. Last LDL was 53 in 7/22 4) CAD: s/p CABG x 4 in 1/13.  - No s/s ischemia - Myoview 4/18. No ischemia - Continue ASA 81 and statin   5) DM, type I: - She is followed by Dr Buddy Duty. No change.  - With DM1 does not qualify for Jardiance 6) CKD IV:  - Followed by PCP. Renal function stable recently with SCR 1.8 7) Moderate functional MR - No change from previous on echo today  Glori Bickers, MD  10:46 AM

## 2021-06-28 ENCOUNTER — Encounter (HOSPITAL_COMMUNITY): Admission: RE | Admit: 2021-06-28 | Payer: Medicare HMO | Source: Ambulatory Visit

## 2021-07-03 ENCOUNTER — Other Ambulatory Visit: Payer: Self-pay | Admitting: Family Medicine

## 2021-07-03 DIAGNOSIS — F339 Major depressive disorder, recurrent, unspecified: Secondary | ICD-10-CM

## 2021-07-03 DIAGNOSIS — K219 Gastro-esophageal reflux disease without esophagitis: Secondary | ICD-10-CM

## 2021-07-03 DIAGNOSIS — E1069 Type 1 diabetes mellitus with other specified complication: Secondary | ICD-10-CM

## 2021-07-05 ENCOUNTER — Encounter (HOSPITAL_COMMUNITY)
Admission: RE | Admit: 2021-07-05 | Discharge: 2021-07-05 | Disposition: A | Discharge status: Home / Self Care | Payer: Medicare HMO | Source: Ambulatory Visit | Attending: Nephrology | Admitting: Nephrology

## 2021-07-05 DIAGNOSIS — D631 Anemia in chronic kidney disease: Secondary | ICD-10-CM | POA: Diagnosis not present

## 2021-07-05 DIAGNOSIS — N1832 Chronic kidney disease, stage 3b: Secondary | ICD-10-CM | POA: Diagnosis present

## 2021-07-05 LAB — POCT HEMOGLOBIN-HEMACUE: Hemoglobin: 9.7 g/dL — ABNORMAL LOW (ref 12.0–15.0)

## 2021-07-05 MED ORDER — EPOETIN ALFA-EPBX 3000 UNIT/ML IJ SOLN
3000.0000 [IU] | Freq: Once | INTRAMUSCULAR | Status: AC
Start: 2021-07-05 — End: 2021-07-05
  Administered 2021-07-05: 3000 [IU] via SUBCUTANEOUS

## 2021-07-05 MED ORDER — EPOETIN ALFA-EPBX 3000 UNIT/ML IJ SOLN
INTRAMUSCULAR | Status: AC
  Filled 2021-07-05: qty 1

## 2021-08-08 ENCOUNTER — Encounter (HOSPITAL_COMMUNITY)
Admission: RE | Admit: 2021-08-08 | Discharge: 2021-08-08 | Disposition: A | Discharge status: Home / Self Care | Payer: Medicare HMO | Source: Ambulatory Visit | Attending: Nephrology | Admitting: Nephrology

## 2021-08-26 ENCOUNTER — Other Ambulatory Visit: Payer: Self-pay | Admitting: Family Medicine

## 2021-08-26 DIAGNOSIS — E1069 Type 1 diabetes mellitus with other specified complication: Secondary | ICD-10-CM

## 2021-09-11 ENCOUNTER — Encounter (HOSPITAL_COMMUNITY)
Admission: RE | Admit: 2021-09-11 | Discharge: 2021-09-11 | Disposition: A | Discharge status: Home / Self Care | Payer: Medicare HMO | Source: Ambulatory Visit | Attending: Nephrology | Admitting: Nephrology

## 2021-09-18 ENCOUNTER — Other Ambulatory Visit: Payer: Self-pay | Admitting: Family Medicine

## 2021-09-18 ENCOUNTER — Encounter: Payer: Self-pay | Admitting: Family Medicine

## 2021-09-18 DIAGNOSIS — E785 Hyperlipidemia, unspecified: Secondary | ICD-10-CM

## 2021-09-18 DIAGNOSIS — E1069 Type 1 diabetes mellitus with other specified complication: Secondary | ICD-10-CM

## 2021-09-18 NOTE — Telephone Encounter (Signed)
Britney. NTBS 30 days given 08/27/21

## 2021-09-18 NOTE — Telephone Encounter (Signed)
N/A Mailed letter.

## 2021-09-19 ENCOUNTER — Other Ambulatory Visit: Payer: Self-pay | Admitting: Family Medicine

## 2021-09-19 DIAGNOSIS — F339 Major depressive disorder, recurrent, unspecified: Secondary | ICD-10-CM

## 2021-09-19 NOTE — Telephone Encounter (Signed)
Called pt to set up an appt for medication refill. She did not want to schedule at this time and said that she will call back later to set up. Aware that 30 day refill was called in and that no more refills will be called in until she has appt.

## 2021-09-19 NOTE — Telephone Encounter (Signed)
30 day supply given  Please schedule pt a follow up appt for any further refills

## 2021-09-21 ENCOUNTER — Other Ambulatory Visit: Payer: Self-pay | Admitting: Family Medicine

## 2021-09-21 DIAGNOSIS — K219 Gastro-esophageal reflux disease without esophagitis: Secondary | ICD-10-CM

## 2021-09-21 DIAGNOSIS — F339 Major depressive disorder, recurrent, unspecified: Secondary | ICD-10-CM

## 2021-10-04 ENCOUNTER — Other Ambulatory Visit (HOSPITAL_COMMUNITY): Payer: Self-pay | Admitting: *Deleted

## 2021-10-04 MED ORDER — CARVEDILOL 12.5 MG PO TABS: 12.5000 mg | ORAL_TABLET | Freq: Two times a day (BID) | ORAL | 11 refills | Status: DC

## 2021-10-12 ENCOUNTER — Ambulatory Visit: Payer: Medicare PPO | Admitting: Family Medicine

## 2021-10-12 ENCOUNTER — Encounter: Payer: Self-pay | Admitting: Family Medicine

## 2021-10-12 VITALS — BP 135/55 | HR 64 | Temp 97.7°F | Ht 63.0 in | Wt 172.4 lb

## 2021-10-12 DIAGNOSIS — I2510 Atherosclerotic heart disease of native coronary artery without angina pectoris: Secondary | ICD-10-CM

## 2021-10-12 DIAGNOSIS — F339 Major depressive disorder, recurrent, unspecified: Secondary | ICD-10-CM

## 2021-10-12 DIAGNOSIS — Z23 Encounter for immunization: Secondary | ICD-10-CM | POA: Diagnosis not present

## 2021-10-12 DIAGNOSIS — E785 Hyperlipidemia, unspecified: Secondary | ICD-10-CM

## 2021-10-12 DIAGNOSIS — M810 Age-related osteoporosis without current pathological fracture: Secondary | ICD-10-CM | POA: Diagnosis not present

## 2021-10-12 DIAGNOSIS — E1043 Type 1 diabetes mellitus with diabetic autonomic (poly)neuropathy: Secondary | ICD-10-CM

## 2021-10-12 DIAGNOSIS — E039 Hypothyroidism, unspecified: Secondary | ICD-10-CM

## 2021-10-12 DIAGNOSIS — K3184 Gastroparesis: Secondary | ICD-10-CM

## 2021-10-12 DIAGNOSIS — K219 Gastro-esophageal reflux disease without esophagitis: Secondary | ICD-10-CM

## 2021-10-12 DIAGNOSIS — I152 Hypertension secondary to endocrine disorders: Secondary | ICD-10-CM

## 2021-10-12 DIAGNOSIS — E1069 Type 1 diabetes mellitus with other specified complication: Secondary | ICD-10-CM

## 2021-10-12 DIAGNOSIS — D631 Anemia in chronic kidney disease: Secondary | ICD-10-CM

## 2021-10-12 DIAGNOSIS — I5042 Chronic combined systolic (congestive) and diastolic (congestive) heart failure: Secondary | ICD-10-CM

## 2021-10-12 DIAGNOSIS — N184 Chronic kidney disease, stage 4 (severe): Secondary | ICD-10-CM

## 2021-10-12 DIAGNOSIS — I252 Old myocardial infarction: Secondary | ICD-10-CM

## 2021-10-12 DIAGNOSIS — E1065 Type 1 diabetes mellitus with hyperglycemia: Secondary | ICD-10-CM

## 2021-10-12 DIAGNOSIS — E1159 Type 2 diabetes mellitus with other circulatory complications: Secondary | ICD-10-CM

## 2021-10-12 DIAGNOSIS — R110 Nausea: Secondary | ICD-10-CM

## 2021-10-12 LAB — BAYER DCA HB A1C WAIVED: HB A1C (BAYER DCA - WAIVED): 9.6 % — ABNORMAL HIGH (ref 4.8–5.6)

## 2021-10-12 LAB — LIPID PANEL

## 2021-10-12 MED ORDER — METOCLOPRAMIDE HCL 5 MG PO TABS: 5.0000 mg | ORAL_TABLET | Freq: Three times a day (TID) | ORAL | 1 refills | Status: DC

## 2021-10-12 MED ORDER — MIRTAZAPINE 15 MG PO TABS: 15.0000 mg | ORAL_TABLET | Freq: Every day | ORAL | 1 refills | Status: DC

## 2021-10-12 MED ORDER — ROSUVASTATIN CALCIUM 40 MG PO TABS: 40.0000 mg | ORAL_TABLET | Freq: Every day | ORAL | 1 refills | Status: DC

## 2021-10-12 MED ORDER — BUPROPION HCL ER (XL) 150 MG PO TB24: 150.0000 mg | ORAL_TABLET | Freq: Two times a day (BID) | ORAL | 1 refills | Status: DC

## 2021-10-12 MED ORDER — PANTOPRAZOLE SODIUM 40 MG PO TBEC: 40.0000 mg | DELAYED_RELEASE_TABLET | Freq: Every day | ORAL | 1 refills | Status: DC

## 2021-10-12 NOTE — Patient Instructions (Addendum)
Make sure you levothyroxine is 100 mcg at home. If not, please let me know what dose it is.   Schedule your diabetic eye exam.   Please let me know if you are unable to get a freestyle libre through your endocrinologist and we will help.

## 2021-10-12 NOTE — Progress Notes (Signed)
Assessment & Plan:  1. Recurrent depression ([place]) Well controlled on current regimen.  - buPROPion (WELLBUTRIN XL) 150 MG 24 hr tablet; Take 1 tablet (150 mg total) by mouth 2 (two) times daily.  Dispense: 180 tablet; Refill: 1 - mirtazapine (REMERON) 15 MG tablet; Take 1 tablet (15 mg total) by mouth at bedtime.  Dispense: 90 tablet; Refill: 1 - CMP14+EGFR  2. Age-related osteoporosis without current pathological fracture DEXA unavailable today. Patient will return to complete this. - DG [facility] DEXA; Future  3. Gastroesophageal reflux disease without esophagitis Well controlled on current regimen.  - pantoprazole (PROTONIX) 40 MG tablet; Take 1 tablet (40 mg total) by mouth daily.  Dispense: 90 tablet; Refill: 1 - CMP14+EGFR  4. Acquired hypothyroidism Patient to call back and let us know what dose of levothyroxine she is taking. - levothyroxine (SYNTHROID) 100 MCG tablet; Take 100 mcg by mouth daily before breakfast.  5. Hyperlipidemia due to type 1 diabetes mellitus ([place]) Well controlled on current regimen.  - rosuvastatin (CRESTOR) 40 MG tablet; Take 1 tablet (40 mg total) by mouth daily.  Dispense: 90 tablet; Refill: 1 - CMP14+EGFR - Lipid panel  6-7. CAD, multiple vessel/History of non-ST elevation myocardial infarction (NSTEMI) Continue rosuvastatin and aspirin. - rosuvastatin (CRESTOR) 40 MG tablet; Take 1 tablet (40 mg total) by mouth daily.  Dispense: 90 tablet; Refill: 1 - CMP14+EGFR - Lipid panel  8-9. Hypertension associated with diabetes (HCC)/Chronic combined systolic and diastolic congestive heart failure ([place]) Well controlled on current regimen. Managed by cardiology. - CBC with Differential/Platelet - CMP14+EGFR - Lipid panel  10. Type 1 diabetes mellitus with hyperglycemia (HCC) Lab Results  Component Value Date   HGBA1C 9.6 (H) 10/12/2021   HGBA1C 10.1 (H) 03/16/2021   HGBA1C 9.4 (H) 03/10/2020  - Managed by endocrinology. - Diabetes is not at goal  of A1c < 7. - Home glucose monitoring: Discussed with patient if endocrinology cannot help her get a freestyle libre to let me know and we would help her get one. - Patient is currently taking a statin. Patient is taking an ACE-inhibitor/ARB.  - Instruction/counseling given: reminded to get eye exam  Diabetes Health Maintenance Due  Topic Date Due   OPHTHALMOLOGY EXAM  10/14/2019   HEMOGLOBIN A1C  04/11/2022   FOOT EXAM  Discontinued    Lab Results  Component Value Date   LABMICR 260.5 08/14/2018   LABMICR See below: 07/29/2017   MICROALBUR 481 10/06/2020   MICROALBUR 14.31 01/10/2017   - CBC with Differential/Platelet - CMP14+EGFR - Lipid panel - Bayer DCA Hb A1c Waived - Pneumococcal conjugate vaccine 20-valent (Prevnar 20)  11. Diabetic gastroparesis associated with type 1 diabetes mellitus ([place]) Well controlled on current regimen.  - metoCLOPramide (REGLAN) 5 MG tablet; Take 1 tablet (5 mg total) by mouth 3 (three) times daily before meals.  Dispense: 270 tablet; Refill: 1 - CMP14+EGFR  12. CKD (chronic kidney disease) stage 4, GFR 15-29 ml/min (HCC) Managed by nephrology.  13. Anemia in stage 4 chronic kidney disease ([place]) Managed by nephrology.  14. Immunization due - Pneumococcal conjugate vaccine 20-valent (Prevnar 20)   Return in about 6 months (around 04/11/2022) for annual physical.  Hendricks Limes, MSN, APRN, FNP-C [facility]  Subjective:    Patient ID: Bridget Martinez, female    DOB: 1951-05-17, 71 y.o.   MRN: [id number]  Patient Care Team: Loman Brooklyn, FNP as PCP - General (Family Medicine) [name], Shaune Pascal, MD (Cardiology) Delrae Rend, MD  as Attending Physician (Internal Medicine) Liana Gerold, MD as Consulting Physician (Nephrology) Hayden Pedro, MD as Consulting Physician (Ophthalmology)   Chief Complaint:  Chief Complaint  Patient presents with   Medical Management of Chronic Issues    HPI: Bridget Martinez is a 71 y.o. female presenting on 10/12/2021 for Medical Management of Chronic Issues  Patient is accompanied by her daughter, Bridget Martinez, who she is okay with being present.   CKD stage IV with anemia: managed by nephrology who she last saw on 08/24/2021.  Hyperlipidemia/CAD/history of NSTEMI: taking rosuvastatin and aspirin.  Diabetes: managed by Dr. Buddy Duty, endocrinologist. She currently has the [facility] system which she is having a hard time affording. They are buying this without insurance since insurance isn't covering it. She is scheduled to follow-up with him at the beginning of March. 08/31/2021.  Hypothyroidism: it is unclear what dose of levothyroxine patient is taking.  The last prescription sent in by me was 88 mcg, but the patient thinks she is taking 100 mcg.  She does not think her endocrinologist has adjusted the dosage.  Hypertension/Heart Failure: managed by cardiology who she last saw on 06/11/2021.  GERD: Taking Protonix.  Depression: taking Remeron 15 mg at bedtime and Wellbutrin twice daily.  Depression screen [facility] 2/9 10/12/2021 03/05/2021 02/16/2021  Decreased Interest 0 1 0  Down, Depressed, Hopeless 1 0 1  PHQ - 2 Score _0 Altered sleeping 0 1 1  Tired, decreased energy 0 1 3  Change in appetite 0 0 0  Feeling bad or failure about yourself  0 0 0  Trouble concentrating 0 0 0  Moving slowly or fidgety/restless 0 1 0  Suicidal thoughts 0 0 0  PHQ-9 Score _1 Difficult doing work/chores Not difficult at all Not difficult at all Somewhat difficult  Some recent data might be hidden    New complaints: None  Social history:  Relevant past medical, surgical, family and social history reviewed and updated as indicated. Interim medical history since our last visit reviewed.  Allergies and medications reviewed and updated.  DATA REVIEWED: CHART IN EPIC  ROS: Negative unless specifically indicated above in HPI.    Current Outpatient Medications:     aspirin 81 MG EC tablet, Take 1 tablet (81 mg total) by mouth daily., Disp: 30 tablet, Rfl: 3   buPROPion (WELLBUTRIN XL) 150 MG 24 hr tablet, Take 1 tablet (150 mg total) by mouth 2 (two) times daily. Needs to be seen for further refills, Disp: 60 tablet, Rfl: 0   carvedilol (COREG) 12.5 MG tablet, Take 1 tablet (12.5 mg total) by mouth 2 (two) times daily with a meal., Disp: 60 tablet, Rfl: 11   Continuous Blood Gluc Receiver ([place]) DEVI, See admin instructions., Disp: , Rfl:    Continuous Blood Gluc Sensor (DEXCOM G6 SENSOR) MISC, See admin instructions., Disp: , Rfl:    ENTRESTO 97-103 MG, TAKE 1 TABLET BY MOUTH TWICE A DAY, Disp: 180 tablet, Rfl: 2   escitalopram (LEXAPRO) 20 MG tablet, Take 20 mg by mouth daily., Disp: , Rfl:    insulin aspart (NOVOLOG FLEXPEN) 100 UNIT/ML FlexPen, 13 to 17 units at breakfast, 7 to 11 units at lunch and at supper, Disp: , Rfl:    LEVEMIR FLEXTOUCH 100 UNIT/ML FlexPen, INJECT 22 UNITS INTO THE SKIN IN THE MORNING AND 18 UNITS AT BEDTIME. (Patient taking differently: 24am & 20PM), Disp: 9 mL, Rfl: 0   levothyroxine (SYNTHROID) 88  MCG tablet, TAKE 1 TABLET BY MOUTH EVERY DAY (Patient taking differently: 100 mcg.), Disp: 90 tablet, Rfl: 3   metoCLOPramide (REGLAN) 5 MG tablet, TAKE 1 TABLET BY MOUTH 3 TIMES DAILY BEFORE MEALS, Disp: 270 tablet, Rfl: 1   pantoprazole (PROTONIX) 40 MG tablet, Take 1 tablet (40 mg total) by mouth daily. (NEEDS TO BE SEEN BEFORE NEXT REFILL), Disp: 30 tablet, Rfl: 0   potassium chloride (KLOR-CON) 20 MEQ packet, Take by mouth 2 (two) times daily., Disp: , Rfl:    rosuvastatin (CRESTOR) 40 MG tablet, Take 1 tablet (40 mg total) by mouth daily. (NEEDS TO BE SEEN BEFORE NEXT REFILL), Disp: 30 tablet, Rfl: 0   mirtazapine (REMERON) 15 MG tablet, Take 1 tablet (15 mg total) by mouth at bedtime. (NEEDS TO BE SEEN BEFORE NEXT REFILL) (Patient not taking: Reported on 10/12/2021), Disp: 30 tablet, Rfl: 0   sodium bicarbonate 650 MG  tablet, Take 650 mg by mouth 2 (two) times daily. (Patient not taking: Reported on 10/12/2021), Disp: , Rfl:    spironolactone (ALDACTONE) 25 MG tablet, Take 12.5 mg by mouth daily. (Patient not taking: Reported on 10/12/2021), Disp: , Rfl:    Allergies  Allergen Reactions   Penicillins Anaphylaxis and Hives    Tolerates zosyn Has patient had a PCN reaction causing immediate rash, facial/tongue/throat swelling, SOB or lightheadedness with hypotension: Yes Has patient had a PCN reaction causing severe rash involving mucus membranes or skin necrosis: No Has patient had a PCN reaction that required hospitalization: Yes Has patient had a PCN reaction occurring within the last 10 years: No If all of the above answers are "NO", then may proceed with Cephalosporin use.    Sulfa Antibiotics Anaphylaxis, Swelling and Other (See Comments)    Stiff neck also    Ciprofloxacin Other (See Comments)   Macrobid [Nitrofurantoin Monohyd Macro] Other (See Comments)    Skin peels off   Other Other (See Comments)   Ciprofloxacin Hcl Other (See Comments)    Skin peeling   Past Medical History:  Diagnosis Date   Arthritis    CHF (congestive heart failure) ([place])    Coronary artery disease    angina.  MI.    Depression    anxiety   Diabetes mellitus    on insulin, with h/o DKA    GERD (gastroesophageal reflux disease)    Hiatal hernia.    HTN (hypertension)    Hyperlipidemia    Hypothyroidism    Left displaced femoral neck fracture ([place]) 11/19/2014   Myocardial infarction [facility])    NSTEMI 07/2011 with cardiogenic shock, s/p CABG 09/2011   Osteoporosis    Pneumonia        Tuberculosis    Venous stasis ulcers ([place])     Past Surgical History:  Procedure Laterality Date   CARDIAC CATHETERIZATION     COLONOSCOPY N/A 11/18/2013   Procedure: COLONOSCOPY;  Surgeon: Lafayette Dragon, MD;  Location: [place];  Service: Endoscopy;  Laterality: N/A;   CORONARY ARTERY BYPASS GRAFT     Dr. Lucianne Lei Trigt in 09/2011     CORONARY ARTERY BYPASS GRAFT  2012   ESOPHAGOGASTRODUODENOSCOPY N/A 11/17/2013   Procedure: ESOPHAGOGASTRODUODENOSCOPY (EGD);  Surgeon: Lafayette Dragon, MD;  Location[facility] ENDOSCOPY;  Service: Endoscopy;  Laterality: N/A;   LEFT HEART CATHETERIZATION WITH CORONARY ANGIOGRAM N/A 07/26/2011   Procedure: LEFT HEART CATHETERIZATION WITH CORONARY ANGIOGRAM;  Surgeon: Larey Dresser, MD;  Location: [facility] CATH LAB;  Service: Cardiovascular;  Laterality: N/A;  RIGHT HEART CATHETERIZATION N/A 07/29/2011   Procedure: RIGHT HEART CATH;  Surgeon: Minus Breeding, MD;  Location[facility] CATH LAB;  Service: Cardiovascular;  Laterality: N/A;   TONSILLECTOMY     TOTAL HIP ARTHROPLASTY Left 11/21/2014   Procedure: TOTAL HIP ARTHROPLASTY ANTERIOR APPROACH;  Surgeon: Mcarthur Rossetti, MD;  Location: [place];  Service: Orthopedics;  Laterality: Left;    Social History   Socioeconomic History   Marital status: Widowed    Spouse name: Grayland Ormond; Son Rush Landmark   Number of children: 2   Years of education: 18   Highest education level: Bachelor's degree (e.g., BA, AB, BS)  Occupational History   Occupation: Retired  Tobacco Use   Smoking status: Never   Smokeless tobacco: Never  Vaping Use   Vaping Use: Never used  Substance and Sexual Activity   Alcohol use: No   Drug use: No   Sexual activity: Not on file  Other Topics Concern   Not on file  Social History Narrative   Lives alone, suffers from depression since losing her husband. Son, Rush Landmark takes her to appointments and helps her a lot.   Social Determinants of Health   Financial Resource Strain: Low Risk    Difficulty of Paying Living Expenses: Not hard at all  Food Insecurity: No Food Insecurity   Worried About Charity fundraiser in the Last Year: Never true   [place] in the Last Year: Never true  Transportation Needs: No Transportation Needs   Lack of Transportation (Medical): No   Lack of Transportation (Non-Medical): No  Physical Activity:  Inactive   Days of Exercise per Week: 0 days   Minutes of Exercise per Session: 0 min  Stress: No Stress Concern Present   Feeling of Stress : Only a little  Social Connections: Moderately Integrated   Frequency of Communication with Friends and Family: More than three times a week   Frequency of Social Gatherings with Friends and Family: More than three times a week   Attends Religious Services: 1 to 4 times per year   Active Member of [employer] or Organizations: Yes   Attends Archivist Meetings: 1 to 4 times per year   Marital Status: Widowed  Human resources officer Violence: Not At Risk   Fear of Current or Ex-Partner: No   Emotionally Abused: No   Physically Abused: No   Sexually Abused: No        Objective:    BP (!) 135/55    Pulse 64    Temp 97.7 F (36.5 C) (Temporal)    Ht _0  (1.6 m)    Wt 172 lb 6.4 oz (78.2 kg)    SpO2 97%    BMI 30.54 kg/m   Wt Readings from Last 3 Encounters:  10/12/21 172 lb 6.4 oz (78.2 kg)  06/11/21 170 lb 9.6 oz (77.4 kg)  02/16/21 162 lb (73.5 kg)    Physical Exam Vitals reviewed.  Constitutional:      General: She is not in acute distress.    Appearance: Normal appearance. She is obese. She is not ill-appearing, toxic-appearing or diaphoretic.  HENT:     Head: Normocephalic and atraumatic.  Eyes:     General: No scleral icterus.       Right eye: No discharge.        Left eye: No discharge.     Conjunctiva/sclera: Conjunctivae normal.  Cardiovascular:     Rate and Rhythm: Normal rate and regular rhythm.  Pulmonary:     Effort: Pulmonary effort is normal. No respiratory distress.     Breath sounds: Normal breath sounds. No stridor. No wheezing, rhonchi or rales.  Musculoskeletal:        General: Normal range of motion.     Cervical back: Normal range of motion.  Skin:    General: Skin is warm and dry.     Capillary Refill: Capillary refill takes less than 2 seconds.  Neurological:     General: No focal deficit present.      Mental Status: She is alert and oriented to person, place, and time. Mental status is at baseline.  Psychiatric:        Mood and Affect: Mood normal.        Behavior: Behavior normal.        Thought Content: Thought content normal.        Judgment: Judgment normal.    Lab Results  Component Value Date   TSH 1.690 03/16/2021   Lab Results  Component Value Date   WBC 7.9 03/16/2021   HGB 9.7 (L) 07/05/2021   HCT 30.5 (L) 03/16/2021   MCV 88 03/16/2021   PLT 259 03/16/2021   Lab Results  Component Value Date   NA 139 03/16/2021   K 4.5 03/16/2021   CO2 20 03/16/2021   GLUCOSE 326 (H) 03/16/2021   BUN 35 (H) 03/16/2021   CREATININE 1.85 (H) 03/16/2021   BILITOT 0.3 03/16/2021   ALKPHOS 108 03/16/2021   AST 11 03/16/2021   ALT 10 03/16/2021   PROT 6.2 03/16/2021   ALBUMIN 4.2 03/16/2021   CALCIUM 9.1 03/16/2021   ANIONGAP 9 03/27/2018   EGFR 29 (L) 03/16/2021   Lab Results  Component Value Date   CHOL 131 03/16/2021   Lab Results  Component Value Date   HDL 64 03/16/2021   Lab Results  Component Value Date   LDLCALC 53 03/16/2021   Lab Results  Component Value Date   TRIG 71 03/16/2021   Lab Results  Component Value Date   CHOLHDL 2.0 03/16/2021   Lab Results  Component Value Date   HGBA1C 10.1 (H) 03/16/2021

## 2021-10-13 LAB — CBC WITH DIFFERENTIAL/PLATELET
Basophils Absolute: 0.1 10*3/uL (ref 0.0–0.2)
Basos: 1 %
EOS (ABSOLUTE): 0.3 10*3/uL (ref 0.0–0.4)
Eos: 3 %
Hematocrit: 28.6 % — ABNORMAL LOW (ref 34.0–46.6)
Hemoglobin: 9.2 g/dL — ABNORMAL LOW (ref 11.1–15.9)
Immature Grans (Abs): 0 10*3/uL (ref 0.0–0.1)
Immature Granulocytes: 0 %
Lymphocytes Absolute: 1.4 10*3/uL (ref 0.7–3.1)
Lymphs: 15 %
MCH: 28.2 pg (ref 26.6–33.0)
MCHC: 32.2 g/dL (ref 31.5–35.7)
MCV: 88 fL (ref 79–97)
Monocytes Absolute: 0.7 10*3/uL (ref 0.1–0.9)
Monocytes: 8 %
Neutrophils Absolute: 6.7 10*3/uL (ref 1.4–7.0)
Neutrophils: 73 %
Platelets: 297 10*3/uL (ref 150–450)
RBC: 3.26 x10E6/uL — ABNORMAL LOW (ref 3.77–5.28)
RDW: 16.3 % — ABNORMAL HIGH (ref 11.7–15.4)
WBC: 9.2 10*3/uL (ref 3.4–10.8)

## 2021-10-13 LAB — CMP14+EGFR
ALT: 14 IU/L (ref 0–32)
AST: 13 IU/L (ref 0–40)
Albumin/Globulin Ratio: 1.9 (ref 1.2–2.2)
Albumin: 4.2 g/dL (ref 3.7–4.7)
Alkaline Phosphatase: 110 IU/L (ref 44–121)
BUN/Creatinine Ratio: 15 (ref 12–28)
BUN: 29 mg/dL — ABNORMAL HIGH (ref 8–27)
Bilirubin Total: 0.3 mg/dL (ref 0.0–1.2)
CO2: 20 mmol/L (ref 20–29)
Calcium: 8.9 mg/dL (ref 8.7–10.3)
Chloride: 108 mmol/L — ABNORMAL HIGH (ref 96–106)
Creatinine, Ser: 1.94 mg/dL — ABNORMAL HIGH (ref 0.57–1.00)
Globulin, Total: 2.2 g/dL (ref 1.5–4.5)
Glucose: 271 mg/dL — ABNORMAL HIGH (ref 70–99)
Potassium: 4 mmol/L (ref 3.5–5.2)
Sodium: 142 mmol/L (ref 134–144)
Total Protein: 6.4 g/dL (ref 6.0–8.5)
eGFR: 27 mL/min/{1.73_m2} — ABNORMAL LOW (ref >59)

## 2021-10-13 LAB — LIPID PANEL
Chol/HDL Ratio: 2.1 ratio (ref 0.0–4.4)
Cholesterol, Total: 125 mg/dL (ref 100–199)
HDL: 59 mg/dL (ref >39)
LDL Chol Calc (NIH): 54 mg/dL (ref 0–99)
Triglycerides: 53 mg/dL (ref 0–149)
VLDL Cholesterol Cal: 12 mg/dL (ref 5–40)

## 2021-10-15 ENCOUNTER — Encounter: Payer: Self-pay | Admitting: Family Medicine

## 2021-10-15 DIAGNOSIS — E1065 Type 1 diabetes mellitus with hyperglycemia: Secondary | ICD-10-CM | POA: Insufficient documentation

## 2021-10-23 ENCOUNTER — Encounter (HOSPITAL_COMMUNITY)
Admission: RE | Admit: 2021-10-23 | Discharge: 2021-10-23 | Disposition: A | Discharge status: Home / Self Care | Payer: Medicare PPO | Source: Ambulatory Visit | Attending: Nephrology | Admitting: Nephrology

## 2021-10-23 DIAGNOSIS — D631 Anemia in chronic kidney disease: Secondary | ICD-10-CM | POA: Insufficient documentation

## 2021-10-23 DIAGNOSIS — N1832 Chronic kidney disease, stage 3b: Secondary | ICD-10-CM | POA: Insufficient documentation

## 2021-10-23 LAB — POCT HEMOGLOBIN-HEMACUE: Hemoglobin: 9.3 g/dL — ABNORMAL LOW (ref 12.0–15.0)

## 2021-10-23 MED ORDER — EPOETIN ALFA-EPBX 3000 UNIT/ML IJ SOLN
3000.0000 [IU] | Freq: Once | INTRAMUSCULAR | Status: AC
  Administered 2021-10-23: 3000 [IU] via SUBCUTANEOUS

## 2021-10-23 MED ORDER — EPOETIN ALFA-EPBX 3000 UNIT/ML IJ SOLN
INTRAMUSCULAR | Status: AC
  Filled 2021-10-23: qty 1

## 2021-11-20 ENCOUNTER — Encounter (HOSPITAL_COMMUNITY): Admission: RE | Admit: 2021-11-20 | Payer: Medicare PPO | Source: Ambulatory Visit

## 2021-11-27 ENCOUNTER — Encounter (HOSPITAL_COMMUNITY)
Admission: RE | Admit: 2021-11-27 | Discharge: 2021-11-27 | Disposition: A | Discharge status: Home / Self Care | Payer: Medicare PPO | Source: Ambulatory Visit | Attending: Nephrology | Admitting: Nephrology

## 2021-11-27 ENCOUNTER — Encounter (HOSPITAL_COMMUNITY): Payer: Self-pay

## 2021-11-27 DIAGNOSIS — N1832 Chronic kidney disease, stage 3b: Secondary | ICD-10-CM | POA: Insufficient documentation

## 2021-11-27 DIAGNOSIS — D631 Anemia in chronic kidney disease: Secondary | ICD-10-CM | POA: Diagnosis not present

## 2021-11-27 LAB — POCT HEMOGLOBIN-HEMACUE: Hemoglobin: 9 g/dL — ABNORMAL LOW (ref 12.0–15.0)

## 2021-11-27 MED ORDER — EPOETIN ALFA-EPBX 3000 UNIT/ML IJ SOLN
INTRAMUSCULAR | Status: AC
  Filled 2021-11-27: qty 1

## 2021-11-27 MED ORDER — EPOETIN ALFA-EPBX 3000 UNIT/ML IJ SOLN
3000.0000 [IU] | Freq: Once | INTRAMUSCULAR | Status: AC
  Administered 2021-11-27: 3000 [IU] via SUBCUTANEOUS

## 2021-12-25 ENCOUNTER — Encounter (HOSPITAL_COMMUNITY)
Admission: RE | Admit: 2021-12-25 | Discharge: 2021-12-25 | Disposition: A | Discharge status: Home / Self Care | Payer: Medicare PPO | Source: Ambulatory Visit | Attending: Nephrology | Admitting: Nephrology

## 2022-01-03 ENCOUNTER — Encounter (HOSPITAL_COMMUNITY): Admission: RE | Admit: 2022-01-03 | Payer: Medicare PPO | Source: Ambulatory Visit

## 2022-01-11 DIAGNOSIS — E1043 Type 1 diabetes mellitus with diabetic autonomic (poly)neuropathy: Secondary | ICD-10-CM | POA: Diagnosis not present

## 2022-01-11 DIAGNOSIS — E1065 Type 1 diabetes mellitus with hyperglycemia: Secondary | ICD-10-CM | POA: Diagnosis not present

## 2022-01-11 DIAGNOSIS — E1042 Type 1 diabetes mellitus with diabetic polyneuropathy: Secondary | ICD-10-CM | POA: Diagnosis not present

## 2022-01-11 DIAGNOSIS — Z794 Long term (current) use of insulin: Secondary | ICD-10-CM | POA: Diagnosis not present

## 2022-01-15 DIAGNOSIS — B351 Tinea unguium: Secondary | ICD-10-CM | POA: Diagnosis not present

## 2022-01-15 DIAGNOSIS — M79676 Pain in unspecified toe(s): Secondary | ICD-10-CM | POA: Diagnosis not present

## 2022-01-15 DIAGNOSIS — L84 Corns and callosities: Secondary | ICD-10-CM | POA: Diagnosis not present

## 2022-01-15 DIAGNOSIS — E1142 Type 2 diabetes mellitus with diabetic polyneuropathy: Secondary | ICD-10-CM | POA: Diagnosis not present

## 2022-01-18 DIAGNOSIS — N289 Disorder of kidney and ureter, unspecified: Secondary | ICD-10-CM | POA: Diagnosis not present

## 2022-01-18 DIAGNOSIS — N171 Acute kidney failure with acute cortical necrosis: Secondary | ICD-10-CM | POA: Diagnosis not present

## 2022-01-18 DIAGNOSIS — N170 Acute kidney failure with tubular necrosis: Secondary | ICD-10-CM | POA: Diagnosis not present

## 2022-01-18 DIAGNOSIS — E1042 Type 1 diabetes mellitus with diabetic polyneuropathy: Secondary | ICD-10-CM | POA: Diagnosis not present

## 2022-01-18 DIAGNOSIS — E039 Hypothyroidism, unspecified: Secondary | ICD-10-CM | POA: Diagnosis not present

## 2022-01-18 LAB — CBC AND DIFFERENTIAL
HCT: 25 — AB (ref 36–46)
Hemoglobin: 8.3 — AB (ref 12.0–16.0)
Platelets: 260 10*3/uL (ref 150–400)
WBC: 9.5

## 2022-01-18 LAB — MICROALBUMIN / CREATININE URINE RATIO: Microalb Creat Ratio: 231

## 2022-01-18 LAB — BASIC METABOLIC PANEL
CO2: 18 (ref 13–22)
Chloride: 105 (ref 99–108)
Creatinine: 2.4 — AB (ref 0.5–1.1)
Glucose: 416
Potassium: 5.3 mEq/L — AB (ref 3.5–5.1)
Sodium: 139 (ref 137–147)

## 2022-01-18 LAB — COMPREHENSIVE METABOLIC PANEL
Albumin: 4.1 (ref 3.5–5.0)
Calcium: 9 (ref 8.7–10.7)

## 2022-01-18 LAB — CBC: RBC: 2.82 — AB (ref 3.87–5.11)

## 2022-01-18 LAB — IRON,TIBC AND FERRITIN PANEL
Ferritin: 465.7
Iron: 103

## 2022-01-18 LAB — VITAMIN D 25 HYDROXY (VIT D DEFICIENCY, FRACTURES): Vit D, 25-Hydroxy: 32.3

## 2022-01-24 DIAGNOSIS — N189 Chronic kidney disease, unspecified: Secondary | ICD-10-CM | POA: Diagnosis not present

## 2022-01-24 DIAGNOSIS — E1122 Type 2 diabetes mellitus with diabetic chronic kidney disease: Secondary | ICD-10-CM | POA: Diagnosis not present

## 2022-01-24 DIAGNOSIS — I129 Hypertensive chronic kidney disease with stage 1 through stage 4 chronic kidney disease, or unspecified chronic kidney disease: Secondary | ICD-10-CM | POA: Diagnosis not present

## 2022-01-24 DIAGNOSIS — D631 Anemia in chronic kidney disease: Secondary | ICD-10-CM | POA: Diagnosis not present

## 2022-01-24 DIAGNOSIS — N170 Acute kidney failure with tubular necrosis: Secondary | ICD-10-CM | POA: Diagnosis not present

## 2022-01-24 DIAGNOSIS — E875 Hyperkalemia: Secondary | ICD-10-CM | POA: Diagnosis not present

## 2022-01-24 DIAGNOSIS — R809 Proteinuria, unspecified: Secondary | ICD-10-CM | POA: Diagnosis not present

## 2022-01-24 DIAGNOSIS — I5042 Chronic combined systolic (congestive) and diastolic (congestive) heart failure: Secondary | ICD-10-CM | POA: Diagnosis not present

## 2022-01-24 DIAGNOSIS — E1129 Type 2 diabetes mellitus with other diabetic kidney complication: Secondary | ICD-10-CM | POA: Diagnosis not present

## 2022-02-07 ENCOUNTER — Encounter (HOSPITAL_COMMUNITY)
Admission: RE | Admit: 2022-02-07 | Discharge: 2022-02-07 | Disposition: A | Discharge status: Home / Self Care | Payer: Medicare PPO | Source: Ambulatory Visit | Attending: Nephrology | Admitting: Nephrology

## 2022-02-07 VITALS — BP 132/68 | HR 62 | Resp 18

## 2022-02-07 DIAGNOSIS — N184 Chronic kidney disease, stage 4 (severe): Secondary | ICD-10-CM | POA: Diagnosis not present

## 2022-02-07 LAB — POCT HEMOGLOBIN-HEMACUE: Hemoglobin: 7.9 g/dL — ABNORMAL LOW (ref 12.0–15.0)

## 2022-02-07 MED ORDER — EPOETIN ALFA-EPBX 10000 UNIT/ML IJ SOLN
5000.0000 [IU] | Freq: Once | INTRAMUSCULAR | Status: AC
  Administered 2022-02-07: 5000 [IU] via SUBCUTANEOUS

## 2022-02-07 MED ORDER — EPOETIN ALFA-EPBX 10000 UNIT/ML IJ SOLN
INTRAMUSCULAR | Status: DC
Start: 2022-02-07 — End: 2022-02-08
  Filled 2022-02-07: qty 1

## 2022-02-07 NOTE — Progress Notes (Signed)
02/07/22 1415-Hgb 7.9. Thayer Headings at Dr Theador Hawthorne office notified. No new orders given.

## 2022-02-08 DIAGNOSIS — N170 Acute kidney failure with tubular necrosis: Secondary | ICD-10-CM | POA: Diagnosis not present

## 2022-02-08 DIAGNOSIS — I129 Hypertensive chronic kidney disease with stage 1 through stage 4 chronic kidney disease, or unspecified chronic kidney disease: Secondary | ICD-10-CM | POA: Diagnosis not present

## 2022-02-08 DIAGNOSIS — I5042 Chronic combined systolic (congestive) and diastolic (congestive) heart failure: Secondary | ICD-10-CM | POA: Diagnosis not present

## 2022-02-08 DIAGNOSIS — E1122 Type 2 diabetes mellitus with diabetic chronic kidney disease: Secondary | ICD-10-CM | POA: Diagnosis not present

## 2022-02-08 DIAGNOSIS — N189 Chronic kidney disease, unspecified: Secondary | ICD-10-CM | POA: Diagnosis not present

## 2022-02-08 DIAGNOSIS — R809 Proteinuria, unspecified: Secondary | ICD-10-CM | POA: Diagnosis not present

## 2022-02-08 DIAGNOSIS — D631 Anemia in chronic kidney disease: Secondary | ICD-10-CM | POA: Diagnosis not present

## 2022-02-08 DIAGNOSIS — E875 Hyperkalemia: Secondary | ICD-10-CM | POA: Diagnosis not present

## 2022-02-08 DIAGNOSIS — E1129 Type 2 diabetes mellitus with other diabetic kidney complication: Secondary | ICD-10-CM | POA: Diagnosis not present

## 2022-02-16 DIAGNOSIS — I129 Hypertensive chronic kidney disease with stage 1 through stage 4 chronic kidney disease, or unspecified chronic kidney disease: Secondary | ICD-10-CM | POA: Diagnosis not present

## 2022-02-16 DIAGNOSIS — E1122 Type 2 diabetes mellitus with diabetic chronic kidney disease: Secondary | ICD-10-CM | POA: Diagnosis not present

## 2022-02-16 DIAGNOSIS — I5042 Chronic combined systolic (congestive) and diastolic (congestive) heart failure: Secondary | ICD-10-CM | POA: Diagnosis not present

## 2022-02-16 DIAGNOSIS — D631 Anemia in chronic kidney disease: Secondary | ICD-10-CM | POA: Diagnosis not present

## 2022-02-16 DIAGNOSIS — E1129 Type 2 diabetes mellitus with other diabetic kidney complication: Secondary | ICD-10-CM | POA: Diagnosis not present

## 2022-02-16 DIAGNOSIS — E8722 Chronic metabolic acidosis: Secondary | ICD-10-CM | POA: Diagnosis not present

## 2022-02-16 DIAGNOSIS — R809 Proteinuria, unspecified: Secondary | ICD-10-CM | POA: Diagnosis not present

## 2022-02-16 DIAGNOSIS — E875 Hyperkalemia: Secondary | ICD-10-CM | POA: Diagnosis not present

## 2022-02-16 DIAGNOSIS — N189 Chronic kidney disease, unspecified: Secondary | ICD-10-CM | POA: Diagnosis not present

## 2022-02-19 ENCOUNTER — Ambulatory Visit: Payer: Medicare PPO

## 2022-02-21 ENCOUNTER — Encounter (HOSPITAL_COMMUNITY)
Admission: RE | Admit: 2022-02-21 | Discharge: 2022-02-21 | Disposition: A | Discharge status: Home / Self Care | Payer: Medicare PPO | Source: Ambulatory Visit | Attending: Nephrology | Admitting: Nephrology

## 2022-02-21 VITALS — BP 127/42 | HR 60 | Temp 97.5°F | Resp 18

## 2022-02-21 DIAGNOSIS — N184 Chronic kidney disease, stage 4 (severe): Secondary | ICD-10-CM | POA: Diagnosis not present

## 2022-02-21 LAB — POCT HEMOGLOBIN-HEMACUE: Hemoglobin: 7.8 g/dL — ABNORMAL LOW (ref 12.0–15.0)

## 2022-02-21 MED ORDER — EPOETIN ALFA-EPBX 10000 UNIT/ML IJ SOLN
10000.0000 [IU] | Freq: Once | INTRAMUSCULAR | Status: AC
  Administered 2022-02-21: 10000 [IU] via SUBCUTANEOUS

## 2022-02-21 MED ORDER — EPOETIN ALFA-EPBX 10000 UNIT/ML IJ SOLN
INTRAMUSCULAR | Status: AC
  Filled 2022-02-21: qty 1

## 2022-03-07 ENCOUNTER — Encounter (HOSPITAL_COMMUNITY)
Admission: RE | Admit: 2022-03-07 | Discharge: 2022-03-07 | Disposition: A | Discharge status: Home / Self Care | Payer: Medicare PPO | Source: Ambulatory Visit | Attending: Nephrology | Admitting: Nephrology

## 2022-03-07 ENCOUNTER — Encounter (HOSPITAL_COMMUNITY): Payer: Self-pay

## 2022-03-07 VITALS — BP 140/52 | HR 69 | Temp 98.1°F | Resp 18 | Ht 63.0 in | Wt 172.4 lb

## 2022-03-07 DIAGNOSIS — N184 Chronic kidney disease, stage 4 (severe): Secondary | ICD-10-CM

## 2022-03-07 LAB — POCT HEMOGLOBIN-HEMACUE: Hemoglobin: 7.9 g/dL — ABNORMAL LOW (ref 12.0–15.0)

## 2022-03-07 MED ORDER — EPOETIN ALFA-EPBX 10000 UNIT/ML IJ SOLN
INTRAMUSCULAR | Status: AC
  Administered 2022-03-07: 10000 [IU] via SUBCUTANEOUS
  Filled 2022-03-07: qty 1

## 2022-03-07 MED ORDER — EPOETIN ALFA-EPBX 10000 UNIT/ML IJ SOLN: 10000.0000 [IU] | Freq: Once | INTRAMUSCULAR | Status: AC

## 2022-03-21 ENCOUNTER — Encounter (HOSPITAL_COMMUNITY)
Admission: RE | Admit: 2022-03-21 | Discharge: 2022-03-21 | Disposition: A | Discharge status: Home / Self Care | Payer: Medicare PPO | Source: Ambulatory Visit | Attending: Nephrology | Admitting: Nephrology

## 2022-03-21 VITALS — BP 140/46 | HR 67 | Temp 98.4°F | Resp 18

## 2022-03-21 DIAGNOSIS — D631 Anemia in chronic kidney disease: Secondary | ICD-10-CM | POA: Diagnosis not present

## 2022-03-21 DIAGNOSIS — N184 Chronic kidney disease, stage 4 (severe): Secondary | ICD-10-CM | POA: Diagnosis not present

## 2022-03-21 LAB — POCT HEMOGLOBIN-HEMACUE: Hemoglobin: 8.5 g/dL — ABNORMAL LOW (ref 12.0–15.0)

## 2022-03-21 MED ORDER — EPOETIN ALFA 10000 UNIT/ML IJ SOLN
10000.0000 [IU] | Freq: Once | INTRAMUSCULAR | Status: AC
  Administered 2022-03-21: 10000 [IU] via SUBCUTANEOUS
  Filled 2022-03-21: qty 1

## 2022-03-21 MED ORDER — EPOETIN ALFA-EPBX 10000 UNIT/ML IJ SOLN: 10000.0000 [IU] | Freq: Once | INTRAMUSCULAR | Status: DC

## 2022-03-22 ENCOUNTER — Other Ambulatory Visit: Payer: Self-pay | Admitting: Family Medicine

## 2022-03-22 DIAGNOSIS — K219 Gastro-esophageal reflux disease without esophagitis: Secondary | ICD-10-CM

## 2022-03-22 DIAGNOSIS — F339 Major depressive disorder, recurrent, unspecified: Secondary | ICD-10-CM

## 2022-03-22 DIAGNOSIS — E1043 Type 1 diabetes mellitus with diabetic autonomic (poly)neuropathy: Secondary | ICD-10-CM

## 2022-03-29 DIAGNOSIS — E875 Hyperkalemia: Secondary | ICD-10-CM | POA: Diagnosis not present

## 2022-03-29 DIAGNOSIS — E8722 Chronic metabolic acidosis: Secondary | ICD-10-CM | POA: Diagnosis not present

## 2022-03-29 DIAGNOSIS — E1129 Type 2 diabetes mellitus with other diabetic kidney complication: Secondary | ICD-10-CM | POA: Diagnosis not present

## 2022-03-29 DIAGNOSIS — R809 Proteinuria, unspecified: Secondary | ICD-10-CM | POA: Diagnosis not present

## 2022-03-29 DIAGNOSIS — N189 Chronic kidney disease, unspecified: Secondary | ICD-10-CM | POA: Diagnosis not present

## 2022-03-29 DIAGNOSIS — I129 Hypertensive chronic kidney disease with stage 1 through stage 4 chronic kidney disease, or unspecified chronic kidney disease: Secondary | ICD-10-CM | POA: Diagnosis not present

## 2022-03-29 DIAGNOSIS — I5042 Chronic combined systolic (congestive) and diastolic (congestive) heart failure: Secondary | ICD-10-CM | POA: Diagnosis not present

## 2022-03-29 DIAGNOSIS — E1122 Type 2 diabetes mellitus with diabetic chronic kidney disease: Secondary | ICD-10-CM | POA: Diagnosis not present

## 2022-03-29 DIAGNOSIS — D631 Anemia in chronic kidney disease: Secondary | ICD-10-CM | POA: Diagnosis not present

## 2022-04-03 ENCOUNTER — Ambulatory Visit (INDEPENDENT_AMBULATORY_CARE_PROVIDER_SITE_OTHER): Payer: Medicare PPO

## 2022-04-03 VITALS — Wt 170.0 lb

## 2022-04-03 DIAGNOSIS — I129 Hypertensive chronic kidney disease with stage 1 through stage 4 chronic kidney disease, or unspecified chronic kidney disease: Secondary | ICD-10-CM | POA: Diagnosis not present

## 2022-04-03 DIAGNOSIS — I5042 Chronic combined systolic (congestive) and diastolic (congestive) heart failure: Secondary | ICD-10-CM | POA: Diagnosis not present

## 2022-04-03 DIAGNOSIS — E1122 Type 2 diabetes mellitus with diabetic chronic kidney disease: Secondary | ICD-10-CM | POA: Diagnosis not present

## 2022-04-03 DIAGNOSIS — Z0000 Encounter for general adult medical examination without abnormal findings: Secondary | ICD-10-CM

## 2022-04-03 DIAGNOSIS — N189 Chronic kidney disease, unspecified: Secondary | ICD-10-CM | POA: Diagnosis not present

## 2022-04-03 DIAGNOSIS — D631 Anemia in chronic kidney disease: Secondary | ICD-10-CM | POA: Diagnosis not present

## 2022-04-03 DIAGNOSIS — R809 Proteinuria, unspecified: Secondary | ICD-10-CM | POA: Diagnosis not present

## 2022-04-03 DIAGNOSIS — Z1231 Encounter for screening mammogram for malignant neoplasm of breast: Secondary | ICD-10-CM | POA: Diagnosis not present

## 2022-04-03 DIAGNOSIS — E1129 Type 2 diabetes mellitus with other diabetic kidney complication: Secondary | ICD-10-CM | POA: Diagnosis not present

## 2022-04-03 DIAGNOSIS — N170 Acute kidney failure with tubular necrosis: Secondary | ICD-10-CM | POA: Diagnosis not present

## 2022-04-03 NOTE — Patient Instructions (Signed)
Bridget Martinez , Thank you for taking time to come for your Medicare Wellness Visit. I appreciate your ongoing commitment to your health goals. Please review the following plan we discussed and let me know if I can assist you in the future.   Screening recommendations/referrals: Colonoscopy: Done 11/18/2013 - Repeat in 10 years  Mammogram: Done 04/21/2019 - Recommend Repeat annually *past due - make appointment soon Bone Density: Due - get at next visit - Repeat every 2 years  Recommended yearly ophthalmology/optometry visit for glaucoma screening and checkup Recommended yearly dental visit for hygiene and checkup  Vaccinations: Influenza vaccine: Done 07/20/2021 - Repeat annually  Pneumococcal vaccine: Done  07/10/2012, 06/07/2015, 10/06/2018, & 10/12/2021 Tdap vaccine: Done 10/06/2018 - Repeat in 10 years  Shingles vaccine: Due - Shingrix is 2 doses 2-6 months apart and over 90% effective  *consider getting at next visit   Covid-19: Done  02/02/2020 & 03/03/2020 - for boosters, contact pharmacy  Advanced directives: Please bring a copy of your health care power of attorney and living will to the office to be added to your chart at your convenience.   Conditions/risks identified: Aim for 30 minutes of exercise or brisk walking, 6-8 glasses of water, and 5 servings of fruits and vegetables each day.   Next appointment: Follow up in one year for your annual wellness visit    Preventive Care 65 Years and Older, Female Preventive care refers to lifestyle choices and visits with your health care provider that can promote health and wellness. What does preventive care include? A yearly physical exam. This is also called an annual well check. Dental exams once or twice a year. Routine eye exams. Ask your health care provider how often you should have your eyes checked. Personal lifestyle choices, including: Daily care of your teeth and gums. Regular physical activity. Eating a healthy diet. Avoiding  tobacco and drug use. Limiting alcohol use. Practicing safe sex. Taking low-dose aspirin every day. Taking vitamin and mineral supplements as recommended by your health care provider. What happens during an annual well check? The services and screenings done by your health care provider during your annual well check will depend on your age, overall health, lifestyle risk factors, and family history of disease. Counseling  Your health care provider may ask you questions about your: Alcohol use. Tobacco use. Drug use. Emotional well-being. Home and relationship well-being. Sexual activity. Eating habits. History of falls. Memory and ability to understand (cognition). Work and work Statistician. Reproductive health. Screening  You may have the following tests or measurements: Height, weight, and BMI. Blood pressure. Lipid and cholesterol levels. These may be checked every 5 years, or more frequently if you are over 59 years old. Skin check. Lung cancer screening. You may have this screening every year starting at age 7 if you have a 30-pack-year history of smoking and currently smoke or have quit within the past 15 years. Fecal occult blood test (FOBT) of the stool. You may have this test every year starting at age 70. Flexible sigmoidoscopy or colonoscopy. You may have a sigmoidoscopy every 5 years or a colonoscopy every 10 years starting at age 102. Hepatitis C blood test. Hepatitis B blood test. Sexually transmitted disease (STD) testing. Diabetes screening. This is done by checking your blood sugar (glucose) after you have not eaten for a while (fasting). You may have this done every 1-3 years. Bone density scan. This is done to screen for osteoporosis. You may have this done starting at age  65. Mammogram. This may be done every 1-2 years. Talk to your health care provider about how often you should have regular mammograms. Talk with your health care provider about your test  results, treatment options, and if necessary, the need for more tests. Vaccines  Your health care provider may recommend certain vaccines, such as: Influenza vaccine. This is recommended every year. Tetanus, diphtheria, and acellular pertussis (Tdap, Td) vaccine. You may need a Td booster every 10 years. Zoster vaccine. You may need this after age 13. Pneumococcal 13-valent conjugate (PCV13) vaccine. One dose is recommended after age 20. Pneumococcal polysaccharide (PPSV23) vaccine. One dose is recommended after age 59. Talk to your health care provider about which screenings and vaccines you need and how often you need them. This information is not intended to replace advice given to you by your health care provider. Make sure you discuss any questions you have with your health care provider. Document Released: 09/22/2015 Document Revised: 05/15/2016 Document Reviewed: 06/27/2015 Elsevier Interactive Patient Education  2017 [place] Prevention in the Home Falls can cause injuries. They can happen to people of all ages. There are many things you can do to make your home safe and to help prevent falls. What can I do on the outside of my home? Regularly fix the edges of walkways and driveways and fix any cracks. Remove anything that might make you trip as you walk through a door, such as a raised step or threshold. Trim any bushes or trees on the path to your home. Use bright outdoor lighting. Clear any walking paths of anything that might make someone trip, such as rocks or tools. Regularly check to see if handrails are loose or broken. Make sure that both sides of any steps have handrails. Any raised decks and porches should have guardrails on the edges. Have any leaves, snow, or ice cleared regularly. Use sand or salt on walking paths during winter. Clean up any spills in your garage right away. This includes oil or grease spills. What can I do in the bathroom? Use night  lights. Install grab bars by the toilet and in the tub and shower. Do not use towel bars as grab bars. Use non-skid mats or decals in the tub or shower. If you need to sit down in the shower, use a plastic, non-slip stool. Keep the floor dry. Clean up any water that spills on the floor as soon as it happens. Remove soap buildup in the tub or shower regularly. Attach bath mats securely with double-sided non-slip rug tape. Do not have throw rugs and other things on the floor that can make you trip. What can I do in the bedroom? Use night lights. Make sure that you have a light by your bed that is easy to reach. Do not use any sheets or blankets that are too big for your bed. They should not hang down onto the floor. Have a firm chair that has side arms. You can use this for support while you get dressed. Do not have throw rugs and other things on the floor that can make you trip. What can I do in the kitchen? Clean up any spills right away. Avoid walking on wet floors. Keep items that you use a lot in easy-to-reach places. If you need to reach something above you, use a strong step stool that has a grab bar. Keep electrical cords out of the way. Do not use floor polish or wax that makes floors slippery.  If you must use wax, use non-skid floor wax. Do not have throw rugs and other things on the floor that can make you trip. What can I do with my stairs? Do not leave any items on the stairs. Make sure that there are handrails on both sides of the stairs and use them. Fix handrails that are broken or loose. Make sure that handrails are as long as the stairways. Check any carpeting to make sure that it is firmly attached to the stairs. Fix any carpet that is loose or worn. Avoid having throw rugs at the top or bottom of the stairs. If you do have throw rugs, attach them to the floor with carpet tape. Make sure that you have a light switch at the top of the stairs and the bottom of the stairs. If  you do not have them, ask someone to add them for you. What else can I do to help prevent falls? Wear shoes that: Do not have high heels. Have rubber bottoms. Are comfortable and fit you well. Are closed at the toe. Do not wear sandals. If you use a stepladder: Make sure that it is fully opened. Do not climb a closed stepladder. Make sure that both sides of the stepladder are locked into place. Ask someone to hold it for you, if possible. Clearly mark and make sure that you can see: Any grab bars or handrails. First and last steps. Where the edge of each step is. Use tools that help you move around (mobility aids) if they are needed. These include: Canes. Walkers. Scooters. Crutches. Turn on the lights when you go into a dark area. Replace any light bulbs as soon as they burn out. Set up your furniture so you have a clear path. Avoid moving your furniture around. If any of your floors are uneven, fix them. If there are any pets around you, be aware of where they are. Review your medicines with your doctor. Some medicines can make you feel dizzy. This can increase your chance of falling. Ask your doctor what other things that you can do to help prevent falls. This information is not intended to replace advice given to you by your health care provider. Make sure you discuss any questions you have with your health care provider. Document Released: 06/22/2009 Document Revised: 02/01/2016 Document Reviewed: 09/30/2014 Elsevier Interactive Patient Education  2017 [employer].

## 2022-04-03 NOTE — Progress Notes (Signed)
Subjective:   Bridget Martinez is a 71 y.o. female who presents for Medicare Annual (Subsequent) preventive examination.  Virtual Visit via Telephone Note  I connected with  Bridget Martinez on 04/03/22 at 12:00 PM EDT by telephone and verified that I am speaking with the correct person using two identifiers.  Location: Patient: Home Provider: [facility] Persons participating in the virtual visit: patient/Nurse Health Advisor   I discussed the limitations, risks, security and privacy concerns of performing an evaluation and management service by telephone and the availability of in person appointments. The patient expressed understanding and agreed to proceed.  Interactive audio and video telecommunications were attempted between this nurse and patient, however failed, due to patient having technical difficulties OR patient did not have access to video capability.  We continued and completed visit with audio only.  Some vital signs may be absent or patient reported.   Bridget Glasheen E Endora Teresi, LPN   Review of Systems     Cardiac Risk Factors include: advanced age (>13men, >20 women);obesity (BMI >30kg/m2);sedentary lifestyle;dyslipidemia;Other (see comment);diabetes mellitus, Risk factor comments: hx MI, CHF, CAD     Objective:    Today's Vitals   04/03/22 1149  Weight: 170 lb (77.1 kg)  PainSc: 0-No pain   Body mass index is 30.11 kg/m.     04/03/2022   11:58 AM 02/16/2021    8:28 AM 02/16/2020    8:44 AM 01/21/2019    8:51 AM 03/27/2018    9:08 PM 03/06/2015   11:51 AM 02/03/2015    3:39 PM  Advanced Directives  Does Patient Have a Medical Advance Directive? Yes No Yes No No No No  Type of Paramedic of [place];Living will  Living will;Healthcare Power of Attorney      Does patient want to make changes to medical advance directive?   No - Patient declined      Copy of [place] in Chart? No - copy requested  No - copy requested      Would  patient like information on creating a medical advance directive?  Yes (MAU/Ambulatory/Procedural Areas - Information given) No - Patient declined Yes (MAU/Ambulatory/Procedural Areas - Information given) Yes (ED - Information included in AVS) No - patient declined information No - patient declined information    Current Medications (verified) Outpatient Encounter Medications as of 04/03/2022  Medication Sig   aspirin 81 MG EC tablet Take 1 tablet (81 mg total) by mouth daily.   buPROPion (WELLBUTRIN XL) 150 MG 24 hr tablet Take 1 tablet (150 mg total) by mouth 2 (two) times daily.   carvedilol (COREG) 12.5 MG tablet Take 1 tablet (12.5 mg total) by mouth 2 (two) times daily with a meal.   Continuous Blood Gluc Receiver ([place]) DEVI See admin instructions.   Continuous Blood Gluc Sensor (DEXCOM G6 SENSOR) MISC See admin instructions.   ENTRESTO 97-103 MG TAKE 1 TABLET BY MOUTH TWICE A DAY   epoetin alfa (EPOGEN) 3000 UNIT/ML injection Inject into the skin.   insulin aspart (NOVOLOG FLEXPEN) 100 UNIT/ML FlexPen 13 to 17 units at breakfast, 7 to 11 units at lunch and at supper   LEVEMIR FLEXTOUCH 100 UNIT/ML FlexPen INJECT 22 UNITS INTO THE SKIN IN THE MORNING AND 18 UNITS AT BEDTIME. (Patient taking differently: 24am & 20PM)   levothyroxine (SYNTHROID) 100 MCG tablet Take 100 mcg by mouth daily before breakfast.   metoCLOPramide (REGLAN) 5 MG tablet TAKE 1 TABLET BY MOUTH 3 TIMES  DAILY BEFORE MEALS.   mirtazapine (REMERON) 15 MG tablet TAKE 1 TABLET BY MOUTH EVERYDAY AT BEDTIME   pantoprazole (PROTONIX) 40 MG tablet TAKE 1 TABLET BY MOUTH EVERY DAY   potassium chloride (KLOR-CON) 20 MEQ packet Take by mouth 2 (two) times daily.   rosuvastatin (CRESTOR) 40 MG tablet Take 1 tablet (40 mg total) by mouth daily.   sodium bicarbonate 650 MG tablet Take 650 mg by mouth 3 (three) times daily.   No facility-administered encounter medications on file as of 04/03/2022.    Allergies  (verified) Penicillins, Sulfa antibiotics, Ciprofloxacin, Macrobid [nitrofurantoin monohyd macro], Other, and Ciprofloxacin hcl   History: Past Medical History:  Diagnosis Date   Arthritis    CHF (congestive heart failure) ([place])    Coronary artery disease    angina.  MI.    Depression    anxiety   Diabetes mellitus    on insulin, with h/o DKA    GERD (gastroesophageal reflux disease)    Hiatal hernia.    HTN (hypertension)    Hyperlipidemia    Hypothyroidism    Left displaced femoral neck fracture ([place]) 11/19/2014   Myocardial infarction [facility])    NSTEMI 07/2011 with cardiogenic shock, s/p CABG 09/2011   Osteoporosis    Pneumonia        Tuberculosis    Venous stasis ulcers ([place])    Past Surgical History:  Procedure Laterality Date   CARDIAC CATHETERIZATION     COLONOSCOPY N/A 11/18/2013   Procedure: COLONOSCOPY;  Surgeon: Lafayette Dragon, MD;  Location: [place];  Service: Endoscopy;  Laterality: N/A;   CORONARY ARTERY BYPASS GRAFT     Dr. Lucianne Lei Trigt in 09/2011    CORONARY ARTERY BYPASS GRAFT  2012   ESOPHAGOGASTRODUODENOSCOPY N/A 11/17/2013   Procedure: ESOPHAGOGASTRODUODENOSCOPY (EGD);  Surgeon: Lafayette Dragon, MD;  Location: [facility] ENDOSCOPY;  Service: Endoscopy;  Laterality: N/A;   LEFT HEART CATHETERIZATION WITH CORONARY ANGIOGRAM N/A 07/26/2011   Procedure: LEFT HEART CATHETERIZATION WITH CORONARY ANGIOGRAM;  Surgeon: Larey Dresser, MD;  Location: [facility] CATH LAB;  Service: Cardiovascular;  Laterality: N/A;   RIGHT HEART CATHETERIZATION N/A 07/29/2011   Procedure: RIGHT HEART CATH;  Surgeon: Minus Breeding, MD;  Location: [facility] CATH LAB;  Service: Cardiovascular;  Laterality: N/A;   TONSILLECTOMY     TOTAL HIP ARTHROPLASTY Left 11/21/2014   Procedure: TOTAL HIP ARTHROPLASTY ANTERIOR APPROACH;  Surgeon: Mcarthur Rossetti, MD;  Location: [place];  Service: Orthopedics;  Laterality: Left;   Family History  Problem Relation Age of Onset   Heart disease Father    Multiple  sclerosis Father    Hypertension Mother    Hyperthyroidism Mother    Diabetes Mother    Heart attack Paternal Grandfather    Heart disease Paternal Grandfather    Diabetes Cousin        Multiple maternal cousins with type 2 diabetes mellitus   Diabetes Maternal Uncle        Type 1 diabetes mellitus   Healthy Daughter    Asthma Son    Diabetes Son    Social History   Socioeconomic History   Marital status: Widowed    Spouse name: Grayland Ormond; Son Rush Landmark   Number of children: 2   Years of education: 18   Highest education level: Bachelor's degree (e.g., BA, AB, BS)  Occupational History   Occupation: Retired  Tobacco Use   Smoking status: Never   Smokeless tobacco: Never  Vaping Use   Vaping Use: Never used  Substance and Sexual Activity   Alcohol use: No   Drug use: No   Sexual activity: Not on file  Other Topics Concern   Not on file  Social History Narrative   Lives alone, suffers from depression since losing her husband. Son, Rush Landmark takes her to appointments and helps her a lot.   Social Determinants of Health   Financial Resource Strain: Low Risk  (04/03/2022)   Overall Financial Resource Strain (CARDIA)    Difficulty of Paying Living Expenses: Not hard at all  Food Insecurity: No Food Insecurity (04/03/2022)   Hunger Vital Sign    Worried About Running Out of Food in the Last Year: Never true    Ran Out of Food in the Last Year: Never true  Transportation Needs: No Transportation Needs (04/03/2022)   PRAPARE - Hydrologist (Medical): No    Lack of Transportation (Non-Medical): No  Physical Activity: Inactive (04/03/2022)   Exercise Vital Sign    Days of Exercise per Week: 0 days    Minutes of Exercise per Session: 0 min  Stress: No Stress Concern Present (04/03/2022)   [place]    Feeling of Stress : Only a little  Social Connections: Socially Isolated (04/03/2022)   Social  Connection and Isolation Panel [NHANES]    Frequency of Communication with Friends and Family: More than three times a week    Frequency of Social Gatherings with Friends and Family: More than three times a week    Attends Religious Services: Never    Marine scientist or Organizations: No    Attends Archivist Meetings: Never    Marital Status: Widowed    Tobacco Counseling Counseling given: Not Answered   Clinical Intake:  Pre-visit preparation completed: Yes  Pain : No/denies pain Pain Score: 0-No pain     BMI - recorded: 30.47 Nutritional Status: BMI > 30  Obese Nutritional Risks: Nausea/ vomitting/ diarrhea Diabetes: Yes CBG done?: No Did pt. bring in CBG monitor from home?: No  How often do you need to have someone help you when you read instructions, pamphlets, or other written materials from your doctor or pharmacy?: 1 - Never  Diabetic? Nutrition Risk Assessment:  Has the patient had any N/V/D within the last 2 months?  Yes  - intermittent - thinks it is her medicine Does the patient have any non-healing wounds?  No  Has the patient had any unintentional weight loss or weight gain?  No   Diabetes:  Is the patient diabetic?  Yes  If diabetic, was a CBG obtained today?  No  Did the patient bring in their glucometer from home?  No  How often do you monitor your CBG's? Several times per day - has DEXCOM.   Financial Strains and Diabetes Management:  Are you having any financial strains with the device, your supplies or your medication? No .  Does the patient want to be seen by Chronic Care Management for management of their diabetes?  No  Would the patient like to be referred to a Nutritionist or for Diabetic Management?  No   Diabetic Exams:  Diabetic Eye Exam: Completed 2020. Overdue for diabetic eye exam. Pt has been advised about the importance in completing this exam. Patient plans to make appt soon.  Diabetic Foot Exam: Completed with  Endocrinology routinely. Pt has been advised about the importance in completing this exam.   Interpreter Needed?: No  Information entered by :: Raford Brissett, LPN   Activities of Daily Living    04/03/2022   11:58 AM  In your present state of health, do you have any difficulty performing the following activities:  Hearing? 0  Vision? 0  Difficulty concentrating or making decisions? 0  Walking or climbing stairs? 0  Dressing or bathing? 0  Doing errands, shopping? 1  Comment son or daughter drive her  Preparing Food and eating ? N  Using the Toilet? N  In the past six months, have you accidently leaked urine? N  Do you have problems with loss of bowel control? N  Managing your Medications? N  Managing your Finances? N  Housekeeping or managing your Housekeeping? N    Patient Care Team: Loman Brooklyn, FNP as PCP - General (Family Medicine) [name], Shaune Pascal, MD (Cardiology) Delrae Rend, MD as Attending Physician (Internal Medicine) Liana Gerold, MD as Consulting Physician (Nephrology) Hayden Pedro, MD as Consulting Physician (Ophthalmology)  Indicate any recent Medical Services you may have received from other than [facility] providers in the past year (date may be approximate).     Assessment:   This is a routine wellness examination for [place].  Hearing/Vision screen Hearing Screening - Comments:: Denies hearing difficulties  - she does have lots of difficulty hearing me over the phone though Vision Screening - Comments:: Denies vision difficulties - behind with routine eye exams with [facility] in [place]  Dietary issues and exercise activities discussed: Current Exercise Habits: The patient does not participate in regular exercise at present, Exercise limited by: cardiac condition(s)   Goals Addressed             This Visit's Progress    Exercise 3x per week (30 min per time)   Not on track    04/03/2022  AWV Goal: Exercise for General  Health  Patient will verbalize understanding of the benefits of increased physical activity: Exercising regularly is important. It will improve your overall fitness, flexibility, and endurance. Regular exercise also will improve your overall health. It can help you control your weight, reduce stress, and improve your bone density. Over the next year, patient will increase physical activity as tolerated with a goal of at least 150 minutes of moderate physical activity per week.  You can tell that you are exercising at a moderate intensity if your heart starts beating faster and you start breathing faster but can still hold a conversation. Moderate-intensity exercise ideas include: Walking 1 mile (1.6 km) in about 15 minutes Biking Hiking Golfing Dancing Water aerobics Patient will verbalize understanding of everyday activities that increase physical activity by providing examples like the following: Yard work, such as: Sales promotion account executive Gardening Washing windows or floors Patient will be able to explain general safety guidelines for exercising:  Before you start a new exercise program, talk with your health care provider. Do not exercise so much that you hurt yourself, feel dizzy, or get very short of breath. Wear comfortable clothes and wear shoes with good support. Drink plenty of water while you exercise to prevent dehydration or heat stroke. Work out until your breathing and your heartbeat get faster.        Depression Screen    04/03/2022   11:55 AM 10/12/2021   10:03 AM 03/05/2021    2:46 PM 02/16/2021    8:10 AM 12/26/2020   10:38 AM 11/10/2020  2:40 PM 03/12/2020    3:48 PM  PHQ 2/9 Scores  PHQ - 2 Score 2 1 1 1 1 4 2   PHQ- 9 Score 2 1 4 5 7 14 10     Fall Risk    04/03/2022   11:53 AM 10/12/2021   10:03 AM 02/16/2021    8:28 AM 11/10/2020    2:40 PM 03/10/2020   10:19 AM  Fall Risk   Falls  in the past year? 1 1 0 0 0  Number falls in past yr: 1 0 0    Injury with Fall? 1 0 0    Risk for fall due to : History of fall(s);Impaired balance/gait;Orthopedic patient  No Fall Risks    Follow up Education provided;Falls prevention discussed Falls prevention discussed Falls prevention discussed      FALL RISK PREVENTION PERTAINING TO THE HOME:  Any stairs in or around the home? Yes  If so, are there any without handrails? No  Home free of loose throw rugs in walkways, pet beds, electrical cords, etc? Yes  Adequate lighting in your home to reduce risk of falls? Yes   ASSISTIVE DEVICES UTILIZED TO PREVENT FALLS:  Life alert? No  Use of a cane, walker or w/c? No  Grab bars in the bathroom? No  Shower chair or bench in shower? Yes  Elevated toilet seat or a handicapped toilet? No   TIMED UP AND GO:  Was the test performed? No . Telephonic visit  Cognitive Function:        02/16/2020    8:46 AM 01/21/2019    8:53 AM 01/21/2019    8:52 AM  6CIT Screen  What Year? 0 points  0 points  What month? 0 points  0 points  What time? 0 points  0 points  Count back from 20 0 points 2 points   Months in reverse 0 points 0 points   Repeat phrase 2 points 0 points   Total Score 2 points      Immunizations Immunization History  Administered Date(s) Administered   Influenza Split 07/27/2011, 07/29/2013, 06/13/2014   Influenza, High Dose Seasonal PF 05/23/2015, 08/14/2018, 06/23/2020, 07/20/2021   Influenza,inj,Quad PF,6+ Mos 05/26/2012, 06/07/2016   Influenza-Unspecified 06/07/2015   Moderna Sars-Covid-2 Vaccination 02/02/2020, 03/03/2020   PNEUMOCOCCAL CONJUGATE-20 10/12/2021   Pneumococcal Conjugate-13 05/23/2015, 10/06/2018   Pneumococcal Polysaccharide-23 07/10/2012   Pneumococcal-Unspecified 06/07/2015   Tdap 10/06/2018    TDAP status: Up to date  Flu Vaccine status: Up to date  Pneumococcal vaccine status: Up to date  Covid-19 vaccine status: Completed  vaccines  Qualifies for Shingles Vaccine? Yes   Zostavax completed Yes   Shingrix Completed?: No.    Education has been provided regarding the importance of this vaccine. Patient has been advised to call insurance company to determine out of pocket expense if they have not yet received this vaccine. Advised may also receive vaccine at local pharmacy or Health Dept. Verbalized acceptance and understanding.  Screening Tests Health Maintenance  Topic Date Due   Zoster Vaccines- Shingrix (1 of 2) Never done   DEXA SCAN  Never done   OPHTHALMOLOGY EXAM  10/14/2019   COVID-19 Vaccine (3 - Moderna risk series) 03/31/2020   MAMMOGRAM  04/20/2021   Hepatitis C Screening  10/12/2022 (Originally 05/15/1969)   INFLUENZA VACCINE  04/09/2022   HEMOGLOBIN A1C  04/11/2022   COLONOSCOPY (Pts 45-43yrs Insurance coverage will need to be confirmed)  11/19/2023   TETANUS/TDAP  10/06/2028   Pneumonia Vaccine 65+  Years old  Completed   HPV VACCINES  Aged Out   FOOT EXAM  Discontinued    Health Maintenance  Health Maintenance Due  Topic Date Due   Zoster Vaccines- Shingrix (1 of 2) Never done   DEXA SCAN  Never done   OPHTHALMOLOGY EXAM  10/14/2019   COVID-19 Vaccine (3 - Moderna risk series) 03/31/2020   MAMMOGRAM  04/20/2021    Colorectal cancer screening: Type of screening: Colonoscopy. Completed 11/18/2013. Repeat every 10 years  Mammogram status: Ordered 04/03/2022. Pt provided with contact info and advised to call to schedule appt.   Bone Density status: Ordered 03/2022. Pt provided with contact info and advised to call to schedule appt.  Lung Cancer Screening: (Low Dose CT Chest recommended if Age 67-80 years, 30 pack-year currently smoking OR have quit w/in 15years.) does not qualify.   Additional Screening:  Hepatitis C Screening: does qualify; DUE  Vision Screening: Recommended annual ophthalmology exams for early detection of glaucoma and other disorders of the eye. Is the patient up to  date with their annual eye exam?  No  Who is the provider or what is the name of the office in which the patient attends annual eye exams? [facility] in [place] If pt is not established with a provider, would they like to be referred to a provider to establish care? No .   Dental Screening: Recommended annual dental exams for proper oral hygiene  Community Resource Referral / Chronic Care Management: CRR required this visit?  No   CCM required this visit?  No      Plan:     I have personally reviewed and noted the following in the patient's chart:   Medical and social history Use of alcohol, tobacco or illicit drugs  Current medications and supplements including opioid prescriptions.  Functional ability and status Nutritional status Physical activity Advanced directives List of other physicians Hospitalizations, surgeries, and ER visits in previous 12 months Vitals Screenings to include cognitive, depression, and falls Referrals and appointments  In addition, I have reviewed and discussed with patient certain preventive protocols, quality metrics, and best practice recommendations. A written personalized care plan for preventive services as well as general preventive health recommendations were provided to patient.     Sandrea Hammond, LPN   09/23/7260   Nurse Notes: Due for eye exam, DEXA, Mammo, Hep C, and SHX - she is willing to do all, but has to wait until she speaks with children to see when they can take her to appts.

## 2022-04-04 ENCOUNTER — Encounter (HOSPITAL_COMMUNITY): Admission: RE | Admit: 2022-04-04 | Payer: Medicare PPO | Source: Ambulatory Visit

## 2022-04-04 DIAGNOSIS — N184 Chronic kidney disease, stage 4 (severe): Secondary | ICD-10-CM

## 2022-04-11 ENCOUNTER — Encounter (HOSPITAL_COMMUNITY)
Admission: RE | Admit: 2022-04-11 | Discharge: 2022-04-11 | Disposition: A | Discharge status: Home / Self Care | Payer: Medicare PPO | Source: Ambulatory Visit | Attending: Nephrology | Admitting: Nephrology

## 2022-04-11 DIAGNOSIS — N184 Chronic kidney disease, stage 4 (severe): Secondary | ICD-10-CM | POA: Insufficient documentation

## 2022-04-11 DIAGNOSIS — D631 Anemia in chronic kidney disease: Secondary | ICD-10-CM | POA: Diagnosis not present

## 2022-04-11 LAB — POCT HEMOGLOBIN-HEMACUE: Hemoglobin: 10 g/dL — ABNORMAL LOW (ref 12.0–15.0)

## 2022-04-11 MED ORDER — EPOETIN ALFA-EPBX 10000 UNIT/ML IJ SOLN: 10000.0000 [IU] | Freq: Once | INTRAMUSCULAR | Status: DC

## 2022-04-16 DIAGNOSIS — E1142 Type 2 diabetes mellitus with diabetic polyneuropathy: Secondary | ICD-10-CM | POA: Diagnosis not present

## 2022-04-16 DIAGNOSIS — M79676 Pain in unspecified toe(s): Secondary | ICD-10-CM | POA: Diagnosis not present

## 2022-04-16 DIAGNOSIS — L84 Corns and callosities: Secondary | ICD-10-CM | POA: Diagnosis not present

## 2022-04-16 DIAGNOSIS — B351 Tinea unguium: Secondary | ICD-10-CM | POA: Diagnosis not present

## 2022-04-19 DIAGNOSIS — E1042 Type 1 diabetes mellitus with diabetic polyneuropathy: Secondary | ICD-10-CM | POA: Diagnosis not present

## 2022-04-22 DIAGNOSIS — E1122 Type 2 diabetes mellitus with diabetic chronic kidney disease: Secondary | ICD-10-CM | POA: Diagnosis not present

## 2022-04-22 DIAGNOSIS — I5042 Chronic combined systolic (congestive) and diastolic (congestive) heart failure: Secondary | ICD-10-CM | POA: Diagnosis not present

## 2022-04-22 DIAGNOSIS — D631 Anemia in chronic kidney disease: Secondary | ICD-10-CM | POA: Diagnosis not present

## 2022-04-22 DIAGNOSIS — N189 Chronic kidney disease, unspecified: Secondary | ICD-10-CM | POA: Diagnosis not present

## 2022-04-22 DIAGNOSIS — E1129 Type 2 diabetes mellitus with other diabetic kidney complication: Secondary | ICD-10-CM | POA: Diagnosis not present

## 2022-04-22 DIAGNOSIS — R809 Proteinuria, unspecified: Secondary | ICD-10-CM | POA: Diagnosis not present

## 2022-04-22 DIAGNOSIS — I129 Hypertensive chronic kidney disease with stage 1 through stage 4 chronic kidney disease, or unspecified chronic kidney disease: Secondary | ICD-10-CM | POA: Diagnosis not present

## 2022-04-22 DIAGNOSIS — N170 Acute kidney failure with tubular necrosis: Secondary | ICD-10-CM | POA: Diagnosis not present

## 2022-04-25 ENCOUNTER — Encounter (HOSPITAL_COMMUNITY)
Admission: RE | Admit: 2022-04-25 | Discharge: 2022-04-25 | Disposition: A | Discharge status: Home / Self Care | Payer: Medicare PPO | Source: Ambulatory Visit | Attending: Nephrology | Admitting: Nephrology

## 2022-04-25 ENCOUNTER — Encounter (HOSPITAL_COMMUNITY): Payer: Self-pay

## 2022-04-25 VITALS — BP 151/54 | HR 66 | Temp 98.4°F | Resp 18

## 2022-04-25 DIAGNOSIS — N184 Chronic kidney disease, stage 4 (severe): Secondary | ICD-10-CM | POA: Diagnosis not present

## 2022-04-25 DIAGNOSIS — D631 Anemia in chronic kidney disease: Secondary | ICD-10-CM | POA: Diagnosis not present

## 2022-04-25 LAB — POCT HEMOGLOBIN-HEMACUE: Hemoglobin: 9.3 g/dL — ABNORMAL LOW (ref 12.0–15.0)

## 2022-04-25 MED ORDER — EPOETIN ALFA 10000 UNIT/ML IJ SOLN: 10000.0000 [IU] | INTRAMUSCULAR | Status: DC

## 2022-04-25 MED ORDER — EPOETIN ALFA-EPBX 10000 UNIT/ML IJ SOLN: 10000.0000 [IU] | Freq: Once | INTRAMUSCULAR | Status: DC

## 2022-04-25 MED ORDER — EPOETIN ALFA 10000 UNIT/ML IJ SOLN
INTRAMUSCULAR | Status: AC
  Administered 2022-04-25: 10000 [IU] via SUBCUTANEOUS
  Filled 2022-04-25: qty 1

## 2022-04-26 DIAGNOSIS — E1065 Type 1 diabetes mellitus with hyperglycemia: Secondary | ICD-10-CM | POA: Diagnosis not present

## 2022-04-26 DIAGNOSIS — E1043 Type 1 diabetes mellitus with diabetic autonomic (poly)neuropathy: Secondary | ICD-10-CM | POA: Diagnosis not present

## 2022-04-26 DIAGNOSIS — Z794 Long term (current) use of insulin: Secondary | ICD-10-CM | POA: Diagnosis not present

## 2022-04-26 DIAGNOSIS — E1042 Type 1 diabetes mellitus with diabetic polyneuropathy: Secondary | ICD-10-CM | POA: Diagnosis not present

## 2022-04-26 LAB — HEMOGLOBIN A1C: Hemoglobin A1C: 7.1

## 2022-05-03 ENCOUNTER — Encounter: Payer: Self-pay | Admitting: Family Medicine

## 2022-05-03 ENCOUNTER — Encounter: Payer: Medicare PPO | Admitting: Family Medicine

## 2022-05-09 ENCOUNTER — Encounter (HOSPITAL_COMMUNITY): Payer: Self-pay

## 2022-05-09 ENCOUNTER — Encounter (HOSPITAL_COMMUNITY)
Admission: RE | Admit: 2022-05-09 | Discharge: 2022-05-09 | Disposition: A | Discharge status: Home / Self Care | Payer: Medicare PPO | Source: Ambulatory Visit | Attending: Nephrology | Admitting: Nephrology

## 2022-05-09 VITALS — BP 146/45 | HR 65 | Temp 98.4°F | Resp 18

## 2022-05-09 DIAGNOSIS — D631 Anemia in chronic kidney disease: Secondary | ICD-10-CM | POA: Diagnosis not present

## 2022-05-09 DIAGNOSIS — N184 Chronic kidney disease, stage 4 (severe): Secondary | ICD-10-CM | POA: Diagnosis not present

## 2022-05-09 LAB — POCT HEMOGLOBIN-HEMACUE: Hemoglobin: 11 g/dL — ABNORMAL LOW (ref 12.0–15.0)

## 2022-05-09 MED ORDER — EPOETIN ALFA-EPBX 10000 UNIT/ML IJ SOLN: 10000.0000 [IU] | Freq: Once | INTRAMUSCULAR | Status: DC

## 2022-05-23 ENCOUNTER — Encounter (HOSPITAL_COMMUNITY): Payer: Self-pay

## 2022-05-23 ENCOUNTER — Encounter (HOSPITAL_COMMUNITY)
Admission: RE | Admit: 2022-05-23 | Discharge: 2022-05-23 | Disposition: A | Discharge status: Home / Self Care | Payer: Medicare PPO | Source: Ambulatory Visit | Attending: Nephrology | Admitting: Nephrology

## 2022-05-23 VITALS — BP 146/43 | HR 70 | Temp 98.3°F | Resp 16 | Ht 63.0 in | Wt 172.4 lb

## 2022-05-23 DIAGNOSIS — N184 Chronic kidney disease, stage 4 (severe): Secondary | ICD-10-CM | POA: Insufficient documentation

## 2022-05-23 DIAGNOSIS — D631 Anemia in chronic kidney disease: Secondary | ICD-10-CM | POA: Diagnosis not present

## 2022-05-23 LAB — POCT HEMOGLOBIN-HEMACUE: Hemoglobin: 9 g/dL — ABNORMAL LOW (ref 12.0–15.0)

## 2022-05-23 MED ORDER — EPOETIN ALFA-EPBX 10000 UNIT/ML IJ SOLN: 10000.0000 [IU] | Freq: Once | INTRAMUSCULAR | Status: DC

## 2022-05-23 MED ORDER — EPOETIN ALFA 10000 UNIT/ML IJ SOLN: 10000.0000 [IU] | Freq: Once | INTRAMUSCULAR | Status: DC

## 2022-05-23 MED ORDER — EPOETIN ALFA-EPBX 10000 UNIT/ML IJ SOLN
INTRAMUSCULAR | Status: AC
  Administered 2022-05-23: 10000 [IU]
  Filled 2022-05-23: qty 1

## 2022-06-06 ENCOUNTER — Encounter (HOSPITAL_COMMUNITY): Admission: RE | Admit: 2022-06-06 | Payer: Medicare PPO | Source: Ambulatory Visit

## 2022-06-06 DIAGNOSIS — E1043 Type 1 diabetes mellitus with diabetic autonomic (poly)neuropathy: Secondary | ICD-10-CM | POA: Diagnosis not present

## 2022-06-06 DIAGNOSIS — E1042 Type 1 diabetes mellitus with diabetic polyneuropathy: Secondary | ICD-10-CM | POA: Diagnosis not present

## 2022-06-06 DIAGNOSIS — Z794 Long term (current) use of insulin: Secondary | ICD-10-CM | POA: Diagnosis not present

## 2022-06-07 ENCOUNTER — Ambulatory Visit (INDEPENDENT_AMBULATORY_CARE_PROVIDER_SITE_OTHER): Payer: Medicare PPO | Admitting: Family Medicine

## 2022-06-07 ENCOUNTER — Encounter: Payer: Self-pay | Admitting: Family Medicine

## 2022-06-07 VITALS — BP 146/72 | HR 69 | Temp 98.5°F | Ht 63.0 in | Wt 173.0 lb

## 2022-06-07 DIAGNOSIS — I152 Hypertension secondary to endocrine disorders: Secondary | ICD-10-CM

## 2022-06-07 DIAGNOSIS — H6123 Impacted cerumen, bilateral: Secondary | ICD-10-CM

## 2022-06-07 DIAGNOSIS — Z0001 Encounter for general adult medical examination with abnormal findings: Secondary | ICD-10-CM

## 2022-06-07 DIAGNOSIS — Z13820 Encounter for screening for osteoporosis: Secondary | ICD-10-CM

## 2022-06-07 DIAGNOSIS — Z0000 Encounter for general adult medical examination without abnormal findings: Secondary | ICD-10-CM

## 2022-06-07 DIAGNOSIS — E1159 Type 2 diabetes mellitus with other circulatory complications: Secondary | ICD-10-CM

## 2022-06-07 DIAGNOSIS — E1065 Type 1 diabetes mellitus with hyperglycemia: Secondary | ICD-10-CM | POA: Diagnosis not present

## 2022-06-07 DIAGNOSIS — N184 Chronic kidney disease, stage 4 (severe): Secondary | ICD-10-CM

## 2022-06-07 DIAGNOSIS — E785 Hyperlipidemia, unspecified: Secondary | ICD-10-CM | POA: Diagnosis not present

## 2022-06-07 DIAGNOSIS — Z23 Encounter for immunization: Secondary | ICD-10-CM | POA: Diagnosis not present

## 2022-06-07 DIAGNOSIS — E039 Hypothyroidism, unspecified: Secondary | ICD-10-CM | POA: Diagnosis not present

## 2022-06-07 DIAGNOSIS — E1069 Type 1 diabetes mellitus with other specified complication: Secondary | ICD-10-CM | POA: Diagnosis not present

## 2022-06-07 DIAGNOSIS — Z1159 Encounter for screening for other viral diseases: Secondary | ICD-10-CM

## 2022-06-07 DIAGNOSIS — Z136 Encounter for screening for cardiovascular disorders: Secondary | ICD-10-CM | POA: Diagnosis not present

## 2022-06-07 NOTE — Progress Notes (Signed)
Subjective:  Patient ID: Bridget Martinez, female    DOB: [birth year], 71 y.o.   MRN: [id number]  Patient Care Team: Loman Brooklyn, FNP as PCP - General (Family Medicine) [name], Shaune Pascal, MD (Cardiology) Delrae Rend, MD as Attending Physician (Internal Medicine) Liana Gerold, MD as Consulting Physician (Nephrology) Hayden Pedro, MD as Consulting Physician (Ophthalmology)   Chief Complaint:  Annual Exam (Vaccinations/)   HPI: Bridget Martinez is a 71 y.o. female presenting on 06/07/2022 for Annual Exam (Vaccinations/)   Pt presents today for annual physical exam. She has a history of diabetes with associated CKD, HTN, and hyperlipidemia. She is followed by cardiology, nephrology, and endocrinology. She was seen by endocrinology yesterday and her insulin regimen was adjusted due to low BS readings during the night. She states she has been doing ok. Her appetite is decreased at night. Pt encouraged to eat a good snack before bed to prevent low readings. She denies shortness of breath, chest pain, palpitations, increased swelling, dizziness, or syncope. No recent illnesses or hospital admissions. She is due for DEXA, influenza, and Shingrix. Willing to have this completed today. She has an appointment with her eye doctor scheduled.     Relevant past medical, surgical, family, and social history reviewed and updated as indicated.  Allergies and medications reviewed and updated. Data reviewed: Chart in Epic.   Past Medical History:  Diagnosis Date   Arthritis    CHF (congestive heart failure) (HCC)    Coronary artery disease    angina.  MI.    Depression    anxiety   Diabetes mellitus    on insulin, with h/o DKA    GERD (gastroesophageal reflux disease)    Hiatal hernia.    HTN (hypertension)    Hyperlipidemia    Hypothyroidism    Left displaced femoral neck fracture ([place]) 11/19/2014   Myocardial infarction [facility])    NSTEMI 07/2011 with cardiogenic shock, s/p  CABG 09/2011   Osteoporosis    Pneumonia        Tuberculosis    Venous stasis ulcers ([place])     Past Surgical History:  Procedure Laterality Date   CARDIAC CATHETERIZATION     COLONOSCOPY N/A 11/18/2013   Procedure: COLONOSCOPY;  Surgeon: Lafayette Dragon, MD;  Location: [place];  Service: Endoscopy;  Laterality: N/A;   CORONARY ARTERY BYPASS GRAFT     Dr. Lucianne Lei Trigt in 09/2011    CORONARY ARTERY BYPASS GRAFT  2012   ESOPHAGOGASTRODUODENOSCOPY N/A 11/17/2013   Procedure: ESOPHAGOGASTRODUODENOSCOPY (EGD);  Surgeon: Lafayette Dragon, MD;  Location[facility] ENDOSCOPY;  Service: Endoscopy;  Laterality: N/A;   LEFT HEART CATHETERIZATION WITH CORONARY ANGIOGRAM N/A 07/26/2011   Procedure: LEFT HEART CATHETERIZATION WITH CORONARY ANGIOGRAM;  Surgeon: Larey Dresser, MD;  Location[facility] CATH LAB;  Service: Cardiovascular;  Laterality: N/A;   RIGHT HEART CATHETERIZATION N/A 07/29/2011   Procedure: RIGHT HEART CATH;  Surgeon: Minus Breeding, MD;  Location[facility] CATH LAB;  Service: Cardiovascular;  Laterality: N/A;   TONSILLECTOMY     TOTAL HIP ARTHROPLASTY Left 11/21/2014   Procedure: TOTAL HIP ARTHROPLASTY ANTERIOR APPROACH;  Surgeon: Mcarthur Rossetti, MD;  Location: [place];  Service: Orthopedics;  Laterality: Left;    Social History   Socioeconomic History   Marital status: Widowed    Spouse name: Grayland Ormond; Son Rush Landmark   Number of children: 2   Years of education: 18   Highest education level: Bachelor's degree (e.g., BA, AB, BS)  Occupational History   Occupation: Retired  Tobacco Use   Smoking status: Never   Smokeless tobacco: Never  Vaping Use   Vaping Use: Never used  Substance and Sexual Activity   Alcohol use: No   Drug use: No   Sexual activity: Not on file  Other Topics Concern   Not on file  Social History Narrative   Lives alone, suffers from depression since losing her husband. Son, Rush Landmark takes her to appointments and helps her a lot.   Social Determinants of Health   Financial  Resource Strain: Low Risk  (04/03/2022)   Overall Financial Resource Strain (CARDIA)    Difficulty of Paying Living Expenses: Not hard at all  Food Insecurity: No Food Insecurity (04/03/2022)   Hunger Vital Sign    Worried About Running Out of Food in the Last Year: Never true    Ran Out of Food in the Last Year: Never true  Transportation Needs: No Transportation Needs (04/03/2022)   PRAPARE - Hydrologist (Medical): No    Lack of Transportation (Non-Medical): No  Physical Activity: Inactive (04/03/2022)   Exercise Vital Sign    Days of Exercise per Week: 0 days    Minutes of Exercise per Session: 0 min  Stress: No Stress Concern Present (04/03/2022)   [place]    Feeling of Stress : Only a little  Social Connections: Socially Isolated (04/03/2022)   Social Connection and Isolation Panel [NHANES]    Frequency of Communication with Friends and Family: More than three times a week    Frequency of Social Gatherings with Friends and Family: More than three times a week    Attends Religious Services: Never    Marine scientist or Organizations: No    Attends Archivist Meetings: Never    Marital Status: Widowed  Intimate Partner Violence: Not At Risk (04/03/2022)   Humiliation, Afraid, Rape, and Kick questionnaire    Fear of Current or Ex-Partner: No    Emotionally Abused: No    Physically Abused: No    Sexually Abused: No    Outpatient Encounter Medications as of 06/07/2022  Medication Sig   aspirin 81 MG EC tablet Take 1 tablet (81 mg total) by mouth daily.   buPROPion (WELLBUTRIN XL) 150 MG 24 hr tablet Take 1 tablet (150 mg total) by mouth 2 (two) times daily.   carvedilol (COREG) 12.5 MG tablet Take 1 tablet (12.5 mg total) by mouth 2 (two) times daily with a meal.   Continuous Blood Gluc Receiver ([place]) DEVI See admin instructions.   Continuous Blood Gluc  Sensor (DEXCOM G6 SENSOR) MISC See admin instructions.   ENTRESTO 24-26 MG SMARTSIG:1 Tablet(s) By Mouth Morning-Evening   epoetin alfa (EPOGEN) 3000 UNIT/ML injection Inject into the skin.   insulin aspart (NOVOLOG FLEXPEN) 100 UNIT/ML FlexPen 13 to 17 units at breakfast, 7 to 11 units at lunch and at supper   LEVEMIR FLEXTOUCH 100 UNIT/ML FlexPen INJECT 22 UNITS INTO THE SKIN IN THE MORNING AND 18 UNITS AT BEDTIME. (Patient taking differently: 24am & 20PM)   levothyroxine (SYNTHROID) 100 MCG tablet Take 100 mcg by mouth daily before breakfast.   metoCLOPramide (REGLAN) 5 MG tablet TAKE 1 TABLET BY MOUTH 3 TIMES DAILY BEFORE MEALS.   mirtazapine (REMERON) 15 MG tablet TAKE 1 TABLET BY MOUTH EVERYDAY AT BEDTIME   pantoprazole (PROTONIX) 40 MG tablet TAKE 1 TABLET BY MOUTH EVERY  DAY   potassium chloride (KLOR-CON) 20 MEQ packet Take by mouth 2 (two) times daily.   rosuvastatin (CRESTOR) 40 MG tablet Take 1 tablet (40 mg total) by mouth daily.   sodium bicarbonate 650 MG tablet Take 650 mg by mouth 3 (three) times daily.   [DISCONTINUED] ENTRESTO 97-103 MG TAKE 1 TABLET BY MOUTH TWICE A DAY   [DISCONTINUED] levothyroxine (SYNTHROID) 88 MCG tablet Take 88 mcg by mouth daily.   No facility-administered encounter medications on file as of 06/07/2022.    Allergies  Allergen Reactions   Penicillins Anaphylaxis and Hives    Tolerates zosyn Has patient had a PCN reaction causing immediate rash, facial/tongue/throat swelling, SOB or lightheadedness with hypotension: Yes Has patient had a PCN reaction causing severe rash involving mucus membranes or skin necrosis: No Has patient had a PCN reaction that required hospitalization: Yes Has patient had a PCN reaction occurring within the last 10 years: No If all of the above answers are "NO", then may proceed with Cephalosporin use.    Sulfa Antibiotics Anaphylaxis, Swelling and Other (See Comments)    Stiff neck also    Ciprofloxacin Other (See  Comments)   Macrobid [Nitrofurantoin Monohyd Macro] Other (See Comments)    Skin peels off   Other Other (See Comments)   Ciprofloxacin Hcl Other (See Comments)    Skin peeling    Review of Systems  Constitutional:  Negative for activity change, appetite change, chills, diaphoresis, fatigue, fever and unexpected weight change.  HENT:  Positive for hearing loss and tinnitus.   Eyes: Negative.  Negative for photophobia and visual disturbance.  Respiratory:  Negative for cough, chest tightness and shortness of breath.   Cardiovascular:  Positive for leg swelling. Negative for chest pain and palpitations.  Gastrointestinal:  Negative for abdominal pain, blood in stool, constipation, diarrhea, nausea and vomiting.  Endocrine: Negative.  Negative for polydipsia, polyphagia and polyuria.  Genitourinary:  Negative for decreased urine volume, difficulty urinating, dysuria, frequency and urgency.  Musculoskeletal:  Negative for arthralgias and myalgias.  Skin: Negative.   Allergic/Immunologic: Negative.   Neurological:  Negative for dizziness, tremors, seizures, syncope, facial asymmetry, speech difficulty, weakness, light-headedness, numbness and headaches.  Hematological: Negative.   Psychiatric/Behavioral:  Negative for confusion, hallucinations, sleep disturbance and suicidal ideas.   All other systems reviewed and are negative.       Objective:  BP (!) 152/71   Pulse 69   Temp 98.4 F (36.9 C)   Ht 5' 3"  (1.6 m)   Wt 173 lb (78.5 kg)   SpO2 95%   BMI 30.65 kg/m    Wt Readings from Last 3 Encounters:  06/07/22 173 lb (78.5 kg)  05/23/22 172 lb 6.4 oz (78.2 kg)  04/03/22 170 lb (77.1 kg)    Physical Exam Vitals and nursing note reviewed.  Constitutional:      General: She is not in acute distress.    Appearance: She is obese. She is ill-appearing (chronically ill). She is not toxic-appearing or diaphoretic.  HENT:     Head: Normocephalic and atraumatic.     Right Ear:  There is impacted cerumen.     Left Ear: There is impacted cerumen.     Nose: Nose normal.     Mouth/Throat:     Mouth: Mucous membranes are moist.     Pharynx: Oropharynx is clear.  Eyes:     Extraocular Movements: Extraocular movements intact.     Conjunctiva/sclera: Conjunctivae normal.     Pupils: Pupils  are equal, round, and reactive to light.  Neck:     Thyroid: No thyroid mass, thyromegaly or thyroid tenderness.     Vascular: No carotid bruit.     Trachea: Trachea and phonation normal.  Cardiovascular:     Rate and Rhythm: Normal rate and regular rhythm.     Heart sounds: Normal heart sounds. No murmur heard.    No friction rub. No gallop.  Pulmonary:     Effort: Pulmonary effort is normal.     Breath sounds: Normal breath sounds.  Abdominal:     General: Bowel sounds are normal.     Palpations: Abdomen is soft.     Tenderness: There is no abdominal tenderness.  Musculoskeletal:        General: Normal range of motion.     Cervical back: Normal range of motion and neck supple.     Right lower leg: 1+ Edema present.     Left lower leg: 1+ Edema present.  Lymphadenopathy:     Cervical: No cervical adenopathy.  Skin:    General: Skin is warm and dry.     Capillary Refill: Capillary refill takes less than 2 seconds.  Neurological:     General: No focal deficit present.     Mental Status: She is alert and oriented to person, place, and time.  Psychiatric:        Mood and Affect: Mood normal.        Behavior: Behavior normal.        Thought Content: Thought content normal.        Judgment: Judgment normal.     Results for orders placed or performed during the hospital encounter of 05/23/22  Hemoglobin-hemacue, POC  Result Value Ref Range   Hemoglobin 9.0 (L) 12.0 - 15.0 g/dL     Ear Cerumen Removal  Date/Time: 06/07/2022 8:49 AM  Performed by: Baruch Gouty, FNP Authorized by: Baruch Gouty, FNP   Anesthesia: Local Anesthetic: none Location details: right  ear Patient tolerance: patient tolerated the procedure well with no immediate complications Comments: Unable to remove in office, debrox drops for 2 weeks and then return for repeat irrigation Procedure type: irrigation  Sedation: Patient sedated: no    Ear Cerumen Removal  Date/Time: 06/07/2022 8:50 AM  Performed by: Baruch Gouty, FNP Authorized by: Baruch Gouty, FNP   Anesthesia: Local Anesthetic: none Location details: left ear Patient tolerance: patient tolerated the procedure well with no immediate complications Comments: Unable to remove in office, debrox drops for 2 weeks and then return for repeat irrigation Procedure type: irrigation  Sedation: Patient sedated: no       Pertinent labs & imaging results that were available during my care of the patient were reviewed by me and considered in my medical decision making.  Assessment & Plan:  Zykerria was seen today for annual exam.  Diagnoses and all orders for this visit:  Annual physical exam Health maintenance discussed in detail and updated today. Diet and exercise encouraged. Labs pending.  -     CBC with Differential/Platelet -     CMP14+EGFR -     Lipid panel -     Thyroid Panel With TSH -     Zoster Recombinant (Shingrix ) -     Hepatitis C antibody -     DG [facility] DEXA; Future  Need for hepatitis C screening test -     Hepatitis C antibody  Screening for osteoporosis -  DG [facility] DEXA; Future  Impacted cerumen of both ears Unable to remove wax in office today. Debrox for 2 weeks and then return for removal.   Type 1 diabetes mellitus with hyperglycemia (HCC) Followed by endocrinology, last A1C 8.1.  -     CBC with Differential/Platelet -     CMP14+EGFR -     Lipid panel -     Thyroid Panel With TSH  CKD (chronic kidney disease) stage 4, GFR 15-29 ml/min (HCC) Followed by nephrology on a regular basis.  -     CMP14+EGFR  Acquired hypothyroidism Will check labs today and adjust regimen if  warranted.  -     Thyroid Panel With TSH  Hypertension associated with diabetes ([place]) Fairly controlled on current regimen. DASH diet and exercise encouraged. No adjustments today.  -     CBC with Differential/Platelet -     CMP14+EGFR -     Lipid panel -     Thyroid Panel With TSH  Hyperlipidemia due to type 1 diabetes mellitus ([place]) Diet encouraged - increase intake of fresh fruits and vegetables, increase intake of lean proteins. Bake, broil, or grill foods. Avoid fried, greasy, and fatty foods. Avoid fast foods. Increase intake of fiber-rich whole grains. Exercise encouraged - at least 150 minutes per week and advance as tolerated. Goal BMI < 25. Continue medications as prescribed. Follow up in 3-6 months as discussed.  -     Lipid panel     Continue all other maintenance medications.  Follow up plan: Return in about 3 months (around 09/06/2022) for DM.   Continue healthy lifestyle choices, including diet (rich in fruits, vegetables, and lean proteins, and low in salt and simple carbohydrates) and exercise (at least 30 minutes of moderate physical activity daily).  Educational handout given for health maintenance   The above assessment and management plan was discussed with the patient. The patient verbalized understanding of and has agreed to the management plan. Patient is aware to call the clinic if they develop any new symptoms or if symptoms persist or worsen. Patient is aware when to return to the clinic for a follow-up visit. Patient educated on when it is appropriate to go to the emergency department.   Monia Pouch, FNP-C [facility] [phone]

## 2022-06-07 NOTE — Patient Instructions (Signed)
Debrox eardrops to bilateral ears nightly

## 2022-06-10 LAB — CMP14+EGFR
ALT: 16 IU/L (ref 0–32)
AST: 15 IU/L (ref 0–40)
Albumin/Globulin Ratio: 2 (ref 1.2–2.2)
Albumin: 4.3 g/dL (ref 3.8–4.8)
Alkaline Phosphatase: 98 IU/L (ref 44–121)
BUN/Creatinine Ratio: 20 (ref 12–28)
BUN: 40 mg/dL — ABNORMAL HIGH (ref 8–27)
Bilirubin Total: 0.3 mg/dL (ref 0.0–1.2)
CO2: 21 mmol/L (ref 20–29)
Calcium: 8.9 mg/dL (ref 8.7–10.3)
Chloride: 107 mmol/L — ABNORMAL HIGH (ref 96–106)
Creatinine, Ser: 1.98 mg/dL — ABNORMAL HIGH (ref 0.57–1.00)
Globulin, Total: 2.1 g/dL (ref 1.5–4.5)
Glucose: 125 mg/dL — ABNORMAL HIGH (ref 70–99)
Potassium: 4.6 mmol/L (ref 3.5–5.2)
Sodium: 142 mmol/L (ref 134–144)
Total Protein: 6.4 g/dL (ref 6.0–8.5)
eGFR: 26 mL/min/{1.73_m2} — ABNORMAL LOW (ref >59)

## 2022-06-10 LAB — CBC WITH DIFFERENTIAL/PLATELET
Basophils Absolute: 0 10*3/uL (ref 0.0–0.2)
Basos: 0 %
EOS (ABSOLUTE): 0.2 10*3/uL (ref 0.0–0.4)
Eos: 2 %
Hematocrit: 28 % — ABNORMAL LOW (ref 34.0–46.6)
Hemoglobin: 8.9 g/dL — CL (ref 11.1–15.9)
Immature Grans (Abs): 0 10*3/uL (ref 0.0–0.1)
Immature Granulocytes: 0 %
Lymphocytes Absolute: 1.3 10*3/uL (ref 0.7–3.1)
Lymphs: 16 %
MCH: 28.6 pg (ref 26.6–33.0)
MCHC: 31.8 g/dL (ref 31.5–35.7)
MCV: 90 fL (ref 79–97)
Monocytes Absolute: 0.8 10*3/uL (ref 0.1–0.9)
Monocytes: 9 %
Neutrophils Absolute: 6 10*3/uL (ref 1.4–7.0)
Neutrophils: 73 %
Platelets: 240 10*3/uL (ref 150–450)
RBC: 3.11 x10E6/uL — ABNORMAL LOW (ref 3.77–5.28)
RDW: 16.3 % — ABNORMAL HIGH (ref 11.7–15.4)
WBC: 8.4 10*3/uL (ref 3.4–10.8)

## 2022-06-10 LAB — HEPATITIS C ANTIBODY: Hep C Virus Ab: NONREACTIVE

## 2022-06-10 LAB — THYROID PANEL WITH TSH
Free Thyroxine Index: 2.5 (ref 1.2–4.9)
T3 Uptake Ratio: 31 % (ref 24–39)
T4, Total: 8.2 ug/dL (ref 4.5–12.0)
TSH: 1.47 u[IU]/mL (ref 0.450–4.500)

## 2022-06-10 LAB — LIPID PANEL
Chol/HDL Ratio: 1.9 ratio (ref 0.0–4.4)
Cholesterol, Total: 132 mg/dL (ref 100–199)
HDL: 70 mg/dL (ref >39)
LDL Chol Calc (NIH): 50 mg/dL (ref 0–99)
Triglycerides: 54 mg/dL (ref 0–149)
VLDL Cholesterol Cal: 12 mg/dL (ref 5–40)

## 2022-06-13 ENCOUNTER — Encounter (HOSPITAL_COMMUNITY): Payer: Self-pay

## 2022-06-13 ENCOUNTER — Encounter (HOSPITAL_COMMUNITY)
Admission: RE | Admit: 2022-06-13 | Discharge: 2022-06-13 | Disposition: A | Discharge status: Home / Self Care | Payer: Medicare PPO | Source: Ambulatory Visit | Attending: Nephrology | Admitting: Nephrology

## 2022-06-13 DIAGNOSIS — D631 Anemia in chronic kidney disease: Secondary | ICD-10-CM | POA: Insufficient documentation

## 2022-06-13 DIAGNOSIS — N184 Chronic kidney disease, stage 4 (severe): Secondary | ICD-10-CM | POA: Diagnosis not present

## 2022-06-13 LAB — POCT HEMOGLOBIN-HEMACUE: Hemoglobin: 10.5 g/dL — ABNORMAL LOW (ref 12.0–15.0)

## 2022-06-13 MED ORDER — EPOETIN ALFA-EPBX 10000 UNIT/ML IJ SOLN: 10000.0000 [IU] | Freq: Once | INTRAMUSCULAR | Status: DC

## 2022-06-14 DIAGNOSIS — N170 Acute kidney failure with tubular necrosis: Secondary | ICD-10-CM | POA: Diagnosis not present

## 2022-06-14 DIAGNOSIS — I129 Hypertensive chronic kidney disease with stage 1 through stage 4 chronic kidney disease, or unspecified chronic kidney disease: Secondary | ICD-10-CM | POA: Diagnosis not present

## 2022-06-14 DIAGNOSIS — I5042 Chronic combined systolic (congestive) and diastolic (congestive) heart failure: Secondary | ICD-10-CM | POA: Diagnosis not present

## 2022-06-14 DIAGNOSIS — R809 Proteinuria, unspecified: Secondary | ICD-10-CM | POA: Diagnosis not present

## 2022-06-14 DIAGNOSIS — N189 Chronic kidney disease, unspecified: Secondary | ICD-10-CM | POA: Diagnosis not present

## 2022-06-14 DIAGNOSIS — E1129 Type 2 diabetes mellitus with other diabetic kidney complication: Secondary | ICD-10-CM | POA: Diagnosis not present

## 2022-06-14 DIAGNOSIS — D631 Anemia in chronic kidney disease: Secondary | ICD-10-CM | POA: Diagnosis not present

## 2022-06-14 DIAGNOSIS — E1122 Type 2 diabetes mellitus with diabetic chronic kidney disease: Secondary | ICD-10-CM | POA: Diagnosis not present

## 2022-06-16 ENCOUNTER — Other Ambulatory Visit: Payer: Self-pay | Admitting: Family Medicine

## 2022-06-16 DIAGNOSIS — I2510 Atherosclerotic heart disease of native coronary artery without angina pectoris: Secondary | ICD-10-CM

## 2022-06-16 DIAGNOSIS — I252 Old myocardial infarction: Secondary | ICD-10-CM

## 2022-06-16 DIAGNOSIS — E1069 Type 1 diabetes mellitus with other specified complication: Secondary | ICD-10-CM

## 2022-06-19 DIAGNOSIS — I5042 Chronic combined systolic (congestive) and diastolic (congestive) heart failure: Secondary | ICD-10-CM | POA: Diagnosis not present

## 2022-06-19 DIAGNOSIS — I129 Hypertensive chronic kidney disease with stage 1 through stage 4 chronic kidney disease, or unspecified chronic kidney disease: Secondary | ICD-10-CM | POA: Diagnosis not present

## 2022-06-19 DIAGNOSIS — E1129 Type 2 diabetes mellitus with other diabetic kidney complication: Secondary | ICD-10-CM | POA: Diagnosis not present

## 2022-06-19 DIAGNOSIS — R809 Proteinuria, unspecified: Secondary | ICD-10-CM | POA: Diagnosis not present

## 2022-06-19 DIAGNOSIS — D631 Anemia in chronic kidney disease: Secondary | ICD-10-CM | POA: Diagnosis not present

## 2022-06-19 DIAGNOSIS — N189 Chronic kidney disease, unspecified: Secondary | ICD-10-CM | POA: Diagnosis not present

## 2022-06-19 DIAGNOSIS — E1122 Type 2 diabetes mellitus with diabetic chronic kidney disease: Secondary | ICD-10-CM | POA: Diagnosis not present

## 2022-06-22 ENCOUNTER — Other Ambulatory Visit: Payer: Self-pay | Admitting: Family Medicine

## 2022-06-22 DIAGNOSIS — F339 Major depressive disorder, recurrent, unspecified: Secondary | ICD-10-CM

## 2022-06-27 ENCOUNTER — Encounter (HOSPITAL_COMMUNITY)
Admission: RE | Admit: 2022-06-27 | Discharge: 2022-06-27 | Disposition: A | Discharge status: Home / Self Care | Payer: Medicare PPO | Source: Ambulatory Visit | Attending: Nephrology | Admitting: Nephrology

## 2022-06-27 VITALS — BP 135/53 | HR 69 | Temp 98.4°F | Resp 18

## 2022-06-27 DIAGNOSIS — D631 Anemia in chronic kidney disease: Secondary | ICD-10-CM | POA: Diagnosis not present

## 2022-06-27 DIAGNOSIS — N184 Chronic kidney disease, stage 4 (severe): Secondary | ICD-10-CM | POA: Diagnosis not present

## 2022-06-27 MED ORDER — EPOETIN ALFA-EPBX 10000 UNIT/ML IJ SOLN
10000.0000 [IU] | Freq: Once | INTRAMUSCULAR | Status: AC
  Administered 2022-06-27: 10000 [IU] via SUBCUTANEOUS

## 2022-06-27 MED ORDER — EPOETIN ALFA-EPBX 10000 UNIT/ML IJ SOLN
INTRAMUSCULAR | Status: AC
  Filled 2022-06-27: qty 1

## 2022-06-30 ENCOUNTER — Other Ambulatory Visit: Payer: Self-pay | Admitting: Family Medicine

## 2022-06-30 DIAGNOSIS — F339 Major depressive disorder, recurrent, unspecified: Secondary | ICD-10-CM

## 2022-07-03 ENCOUNTER — Other Ambulatory Visit: Payer: Self-pay | Admitting: Family Medicine

## 2022-07-03 DIAGNOSIS — K219 Gastro-esophageal reflux disease without esophagitis: Secondary | ICD-10-CM

## 2022-07-03 LAB — POCT HEMOGLOBIN-HEMACUE: Hemoglobin: 9.1 g/dL — ABNORMAL LOW (ref 12.0–15.0)

## 2022-07-11 ENCOUNTER — Encounter (HOSPITAL_COMMUNITY)
Admission: RE | Admit: 2022-07-11 | Discharge: 2022-07-11 | Disposition: A | Discharge status: Home / Self Care | Payer: Medicare PPO | Source: Ambulatory Visit | Attending: Nephrology | Admitting: Nephrology

## 2022-07-11 DIAGNOSIS — N184 Chronic kidney disease, stage 4 (severe): Secondary | ICD-10-CM | POA: Insufficient documentation

## 2022-07-11 DIAGNOSIS — D631 Anemia in chronic kidney disease: Secondary | ICD-10-CM | POA: Insufficient documentation

## 2022-07-11 NOTE — Progress Notes (Addendum)
Pt called and canceled appt. Attempted to call pt to reschedule. No answer. No answering machine to leave message.

## 2022-07-18 ENCOUNTER — Other Ambulatory Visit: Payer: Self-pay | Admitting: Family Medicine

## 2022-07-18 ENCOUNTER — Encounter (HOSPITAL_COMMUNITY): Payer: Self-pay

## 2022-07-18 ENCOUNTER — Encounter (HOSPITAL_COMMUNITY)
Admission: RE | Admit: 2022-07-18 | Discharge: 2022-07-18 | Disposition: A | Discharge status: Home / Self Care | Payer: Medicare PPO | Source: Ambulatory Visit | Attending: Nephrology | Admitting: Nephrology

## 2022-07-18 VITALS — BP 134/48 | HR 64 | Temp 98.3°F | Resp 18 | Ht 63.0 in | Wt 173.1 lb

## 2022-07-18 DIAGNOSIS — N184 Chronic kidney disease, stage 4 (severe): Secondary | ICD-10-CM

## 2022-07-18 DIAGNOSIS — D631 Anemia in chronic kidney disease: Secondary | ICD-10-CM | POA: Diagnosis not present

## 2022-07-18 DIAGNOSIS — E039 Hypothyroidism, unspecified: Secondary | ICD-10-CM

## 2022-07-18 DIAGNOSIS — E1043 Type 1 diabetes mellitus with diabetic autonomic (poly)neuropathy: Secondary | ICD-10-CM

## 2022-07-18 DIAGNOSIS — F339 Major depressive disorder, recurrent, unspecified: Secondary | ICD-10-CM

## 2022-07-18 DIAGNOSIS — K219 Gastro-esophageal reflux disease without esophagitis: Secondary | ICD-10-CM

## 2022-07-18 LAB — POCT HEMOGLOBIN-HEMACUE: Hemoglobin: 9 g/dL — ABNORMAL LOW (ref 12.0–15.0)

## 2022-07-18 MED ORDER — EPOETIN ALFA-EPBX 10000 UNIT/ML IJ SOLN
10000.0000 [IU] | Freq: Once | INTRAMUSCULAR | Status: AC
  Administered 2022-07-18: 10000 [IU] via SUBCUTANEOUS

## 2022-07-18 MED ORDER — EPOETIN ALFA-EPBX 10000 UNIT/ML IJ SOLN
INTRAMUSCULAR | Status: AC
  Filled 2022-07-18: qty 1

## 2022-08-06 ENCOUNTER — Encounter (HOSPITAL_COMMUNITY)
Admission: RE | Admit: 2022-08-06 | Discharge: 2022-08-06 | Disposition: A | Discharge status: Home / Self Care | Payer: Medicare PPO | Source: Ambulatory Visit | Attending: Nephrology | Admitting: Nephrology

## 2022-08-06 VITALS — BP 136/48 | HR 58 | Temp 98.2°F | Resp 18

## 2022-08-06 DIAGNOSIS — N184 Chronic kidney disease, stage 4 (severe): Secondary | ICD-10-CM | POA: Diagnosis not present

## 2022-08-06 DIAGNOSIS — D631 Anemia in chronic kidney disease: Secondary | ICD-10-CM | POA: Diagnosis not present

## 2022-08-06 LAB — POCT HEMOGLOBIN-HEMACUE: Hemoglobin: 9 g/dL — ABNORMAL LOW (ref 12.0–15.0)

## 2022-08-06 MED ORDER — EPOETIN ALFA-EPBX 10000 UNIT/ML IJ SOLN
10000.0000 [IU] | Freq: Once | INTRAMUSCULAR | Status: AC
  Administered 2022-08-06: 10000 [IU] via SUBCUTANEOUS

## 2022-08-06 MED ORDER — EPOETIN ALFA-EPBX 10000 UNIT/ML IJ SOLN
INTRAMUSCULAR | Status: AC
  Filled 2022-08-06: qty 1

## 2022-08-20 ENCOUNTER — Encounter (HOSPITAL_COMMUNITY)
Admission: RE | Admit: 2022-08-20 | Discharge: 2022-08-20 | Disposition: A | Discharge status: Home / Self Care | Payer: Medicare PPO | Source: Ambulatory Visit | Attending: Nephrology | Admitting: Nephrology

## 2022-08-20 DIAGNOSIS — N184 Chronic kidney disease, stage 4 (severe): Secondary | ICD-10-CM | POA: Insufficient documentation

## 2022-08-20 DIAGNOSIS — D631 Anemia in chronic kidney disease: Secondary | ICD-10-CM | POA: Insufficient documentation

## 2022-08-22 ENCOUNTER — Other Ambulatory Visit: Payer: Self-pay

## 2022-08-30 DIAGNOSIS — I5042 Chronic combined systolic (congestive) and diastolic (congestive) heart failure: Secondary | ICD-10-CM | POA: Diagnosis not present

## 2022-08-30 DIAGNOSIS — I129 Hypertensive chronic kidney disease with stage 1 through stage 4 chronic kidney disease, or unspecified chronic kidney disease: Secondary | ICD-10-CM | POA: Diagnosis not present

## 2022-08-30 DIAGNOSIS — N189 Chronic kidney disease, unspecified: Secondary | ICD-10-CM | POA: Diagnosis not present

## 2022-08-30 DIAGNOSIS — R809 Proteinuria, unspecified: Secondary | ICD-10-CM | POA: Diagnosis not present

## 2022-08-30 DIAGNOSIS — D631 Anemia in chronic kidney disease: Secondary | ICD-10-CM | POA: Diagnosis not present

## 2022-08-30 DIAGNOSIS — E1122 Type 2 diabetes mellitus with diabetic chronic kidney disease: Secondary | ICD-10-CM | POA: Diagnosis not present

## 2022-08-30 DIAGNOSIS — E1129 Type 2 diabetes mellitus with other diabetic kidney complication: Secondary | ICD-10-CM | POA: Diagnosis not present

## 2022-09-03 ENCOUNTER — Encounter (HOSPITAL_COMMUNITY)
Admission: RE | Admit: 2022-09-03 | Discharge: 2022-09-03 | Disposition: A | Discharge status: Home / Self Care | Payer: Medicare PPO | Source: Ambulatory Visit | Attending: Nephrology | Admitting: Nephrology

## 2022-09-03 VITALS — BP 114/102 | HR 66 | Temp 98.7°F | Resp 17

## 2022-09-03 DIAGNOSIS — D631 Anemia in chronic kidney disease: Secondary | ICD-10-CM | POA: Diagnosis not present

## 2022-09-03 DIAGNOSIS — N184 Chronic kidney disease, stage 4 (severe): Secondary | ICD-10-CM

## 2022-09-03 LAB — POCT HEMOGLOBIN-HEMACUE: Hemoglobin: 9.2 g/dL — ABNORMAL LOW (ref 12.0–15.0)

## 2022-09-03 MED ORDER — EPOETIN ALFA-EPBX 10000 UNIT/ML IJ SOLN
10000.0000 [IU] | Freq: Once | INTRAMUSCULAR | Status: AC
  Administered 2022-09-03: 10000 [IU] via SUBCUTANEOUS
  Filled 2022-09-03: qty 1

## 2022-09-03 NOTE — Progress Notes (Signed)
Diagnosis: Anemia in Chronic Kidney Disease  Provider:  Manpreet Bhutani MD  Procedure: Injection  Retacrit (epoetin alfa-epbx), Dose: 10000 Units, Site: subcutaneous, Number of injections: 1  HGB 9.2  Post Care: Patient declined observation  Discharge: Condition: Good, Destination: Home . AVS provided to patient.   Performed by:  Grayland Ormond, RN

## 2022-09-04 DIAGNOSIS — I5042 Chronic combined systolic (congestive) and diastolic (congestive) heart failure: Secondary | ICD-10-CM | POA: Diagnosis not present

## 2022-09-04 DIAGNOSIS — R809 Proteinuria, unspecified: Secondary | ICD-10-CM | POA: Diagnosis not present

## 2022-09-04 DIAGNOSIS — E1122 Type 2 diabetes mellitus with diabetic chronic kidney disease: Secondary | ICD-10-CM | POA: Diagnosis not present

## 2022-09-04 DIAGNOSIS — N189 Chronic kidney disease, unspecified: Secondary | ICD-10-CM | POA: Diagnosis not present

## 2022-09-04 DIAGNOSIS — I129 Hypertensive chronic kidney disease with stage 1 through stage 4 chronic kidney disease, or unspecified chronic kidney disease: Secondary | ICD-10-CM | POA: Diagnosis not present

## 2022-09-04 DIAGNOSIS — D631 Anemia in chronic kidney disease: Secondary | ICD-10-CM | POA: Diagnosis not present

## 2022-09-04 DIAGNOSIS — E1129 Type 2 diabetes mellitus with other diabetic kidney complication: Secondary | ICD-10-CM | POA: Diagnosis not present

## 2022-09-05 DIAGNOSIS — E1043 Type 1 diabetes mellitus with diabetic autonomic (poly)neuropathy: Secondary | ICD-10-CM | POA: Diagnosis not present

## 2022-09-05 DIAGNOSIS — Z794 Long term (current) use of insulin: Secondary | ICD-10-CM | POA: Diagnosis not present

## 2022-09-05 DIAGNOSIS — E1042 Type 1 diabetes mellitus with diabetic polyneuropathy: Secondary | ICD-10-CM | POA: Diagnosis not present

## 2022-09-13 ENCOUNTER — Ambulatory Visit: Payer: Medicare PPO | Admitting: Family Medicine

## 2022-09-13 ENCOUNTER — Encounter: Payer: Self-pay | Admitting: Family Medicine

## 2022-09-13 VITALS — BP 148/57 | HR 67 | Temp 96.9°F | Ht 63.0 in | Wt 172.8 lb

## 2022-09-13 DIAGNOSIS — N184 Chronic kidney disease, stage 4 (severe): Secondary | ICD-10-CM

## 2022-09-13 DIAGNOSIS — F339 Major depressive disorder, recurrent, unspecified: Secondary | ICD-10-CM | POA: Diagnosis not present

## 2022-09-13 DIAGNOSIS — Z23 Encounter for immunization: Secondary | ICD-10-CM

## 2022-09-13 DIAGNOSIS — D631 Anemia in chronic kidney disease: Secondary | ICD-10-CM

## 2022-09-13 DIAGNOSIS — E1065 Type 1 diabetes mellitus with hyperglycemia: Secondary | ICD-10-CM | POA: Diagnosis not present

## 2022-09-13 DIAGNOSIS — I5042 Chronic combined systolic (congestive) and diastolic (congestive) heart failure: Secondary | ICD-10-CM

## 2022-09-13 NOTE — Patient Instructions (Signed)

## 2022-09-13 NOTE — Progress Notes (Signed)
Subjective:  Patient ID: Bridget Martinez, female    DOB: 02-24-1951, 72 y.o.   MRN: [id number]  Patient Care Team: Baruch Gouty, FNP as PCP - General (Family Medicine) [name], Shaune Pascal, MD (Cardiology) Delrae Rend, MD as Attending Physician (Internal Medicine) Liana Gerold, MD as Consulting Physician (Nephrology) Hayden Pedro, MD as Consulting Physician (Ophthalmology)   Chief Complaint:  Depression (3 month follow up - Patient states she is doing better)   HPI: Bridget Martinez is a 72 y.o. female presenting on 09/13/2022 for Depression (3 month follow up - Patient states she is doing better)   1. Recurrent depression ([place]) Compliant with current regimen and reports she is feeling better. Does not feel medications need to be adjusted today. No SI or HI.      09/13/2022    8:56 AM 06/07/2022    9:11 AM 04/03/2022   11:55 AM 10/12/2021   10:03 AM 03/05/2021    2:46 PM  Depression screen PHQ 2/9  Decreased Interest 2 2 1  0 1  Down, Depressed, Hopeless 1 2 1 1  0  PHQ - 2 Score 3 4 2 1 1   Altered sleeping 1 1 0 0 1  Tired, decreased energy 1 1 0 0 1  Change in appetite 0 1 0 0 0  Feeling bad or failure about yourself  1 1 0 0 0  Trouble concentrating 0 1 0 0 0  Moving slowly or fidgety/restless 1 1 0 0 1  Suicidal thoughts 0 0 0 0 0  PHQ-9 Score 7 10 2 1 4   Difficult doing work/chores  Somewhat difficult Not difficult at all Not difficult at all Not difficult at all      09/13/2022    8:57 AM 06/07/2022    9:11 AM 10/12/2021   10:04 AM 12/26/2020   10:41 AM  GAD 7 : Generalized Anxiety Score  Nervous, Anxious, on Edge 1 1 0 0  Control/stop worrying 0 1 0 0  Worry too much - different things 0 1 0 0  Trouble relaxing 0 1 0 0  Restless 0 0 0 0  Easily annoyed or irritable 0 1 0 1  Afraid - awful might happen 0 1 0 0  Total GAD 7 Score 1 6 0 1  Anxiety Difficulty   Not difficult at all Not difficult at all    2. Chronic combined systolic and diastolic  congestive heart failure ([place]) Compliant with GDMT. Needs to follow up with cardiology and will schedule an appointment. Denies leg swelling, increased shortness of breath, orthopnea, or PND.   3. Type 1 diabetes mellitus with hyperglycemia ([place]) Followed by endocrinology on a regular basis. Unable to recall last A1C. She is currently on Lantus 18 units in the morning and 10 units at night. States she is doing well on this and readings have been ok, no significant high or low readings.   4. Anemia in stage 4 chronic kidney disease ([place]) Followed by nephrology on a regular basis and has started Epogen therapy. Denies palpitations, worsening fatigue, shortness or breath, or light headedness.     Relevant past medical, surgical, family, and social history reviewed and updated as indicated.  Allergies and medications reviewed and updated. Data reviewed: Chart in Epic.   Past Medical History:  Diagnosis Date   Arthritis    CHF (congestive heart failure) (HCC)    Coronary artery disease    angina.  MI.    Depression  anxiety   Diabetes mellitus    on insulin, with h/o DKA    GERD (gastroesophageal reflux disease)    Hiatal hernia.    HTN (hypertension)    Hyperlipidemia    Hypothyroidism    Left displaced femoral neck fracture ([place]) 11/19/2014   Myocardial infarction [facility])    NSTEMI 07/2011 with cardiogenic shock, s/p CABG 09/2011   Osteoporosis    Pneumonia        Tuberculosis    Venous stasis ulcers ([place])     Past Surgical History:  Procedure Laterality Date   CARDIAC CATHETERIZATION     COLONOSCOPY N/A 11/18/2013   Procedure: COLONOSCOPY;  Surgeon: Lafayette Dragon, MD;  Location: [place];  Service: Endoscopy;  Laterality: N/A;   CORONARY ARTERY BYPASS GRAFT     Dr. Lucianne Lei Trigt in 09/2011    CORONARY ARTERY BYPASS GRAFT  2012   ESOPHAGOGASTRODUODENOSCOPY N/A 11/17/2013   Procedure: ESOPHAGOGASTRODUODENOSCOPY (EGD);  Surgeon: Lafayette Dragon, MD;  Location[facility] ENDOSCOPY;   Service: Endoscopy;  Laterality: N/A;   LEFT HEART CATHETERIZATION WITH CORONARY ANGIOGRAM N/A 07/26/2011   Procedure: LEFT HEART CATHETERIZATION WITH CORONARY ANGIOGRAM;  Surgeon: Larey Dresser, MD;  Location: [facility] CATH LAB;  Service: Cardiovascular;  Laterality: N/A;   RIGHT HEART CATHETERIZATION N/A 07/29/2011   Procedure: RIGHT HEART CATH;  Surgeon: Minus Breeding, MD;  Location: [facility] CATH LAB;  Service: Cardiovascular;  Laterality: N/A;   TONSILLECTOMY     TOTAL HIP ARTHROPLASTY Left 11/21/2014   Procedure: TOTAL HIP ARTHROPLASTY ANTERIOR APPROACH;  Surgeon: Mcarthur Rossetti, MD;  Location: [place];  Service: Orthopedics;  Laterality: Left;    Social History   Socioeconomic History   Marital status: Widowed    Spouse name: Grayland Ormond; Son Rush Landmark   Number of children: 2   Years of education: 18   Highest education level: Bachelor's degree (e.g., BA, AB, BS)  Occupational History   Occupation: Retired  Tobacco Use   Smoking status: Never   Smokeless tobacco: Never  Vaping Use   Vaping Use: Never used  Substance and Sexual Activity   Alcohol use: No   Drug use: No   Sexual activity: Not on file  Other Topics Concern   Not on file  Social History Narrative   Lives alone, suffers from depression since losing her husband. Son, Rush Landmark takes her to appointments and helps her a lot.   Social Determinants of Health   Financial Resource Strain: Low Risk  (04/03/2022)   Overall Financial Resource Strain (CARDIA)    Difficulty of Paying Living Expenses: Not hard at all  Food Insecurity: No Food Insecurity (04/03/2022)   Hunger Vital Sign    Worried About Running Out of Food in the Last Year: Never true    Ran Out of Food in the Last Year: Never true  Transportation Needs: No Transportation Needs (04/03/2022)   PRAPARE - Hydrologist (Medical): No    Lack of Transportation (Non-Medical): No  Physical Activity: Inactive (04/03/2022)   Exercise Vital Sign    Days  of Exercise per Week: 0 days    Minutes of Exercise per Session: 0 min  Stress: No Stress Concern Present (04/03/2022)   [place]    Feeling of Stress : Only a little  Social Connections: Socially Isolated (04/03/2022)   Social Connection and Isolation Panel [NHANES]    Frequency of Communication with Friends and Family: More than three times  a week    Frequency of Social Gatherings with Friends and Family: More than three times a week    Attends Religious Services: Never    Marine scientist or Organizations: No    Attends Archivist Meetings: Never    Marital Status: Widowed  Intimate Partner Violence: Not At Risk (04/03/2022)   Humiliation, Afraid, Rape, and Kick questionnaire    Fear of Current or Ex-Partner: No    Emotionally Abused: No    Physically Abused: No    Sexually Abused: No    Outpatient Encounter Medications as of 09/13/2022  Medication Sig   aspirin 81 MG EC tablet Take 1 tablet (81 mg total) by mouth daily.   buPROPion (WELLBUTRIN XL) 150 MG 24 hr tablet TAKE 1 TABLET BY MOUTH TWICE A DAY   carvedilol (COREG) 12.5 MG tablet Take 1 tablet (12.5 mg total) by mouth 2 (two) times daily with a meal.   Continuous Blood Gluc Receiver (DEXCOM G6 RECEIVER) DEVI See admin instructions.   Continuous Blood Gluc Sensor (DEXCOM G6 SENSOR) MISC See admin instructions.   ENTRESTO 24-26 MG SMARTSIG:1 Tablet(s) By Mouth Morning-Evening   epoetin alfa (EPOGEN) 3000 UNIT/ML injection Inject into the skin.   insulin aspart (NOVOLOG FLEXPEN) 100 UNIT/ML FlexPen 13 to 17 units at breakfast, 7 to 11 units at lunch and at supper   insulin glargine (LANTUS) 100 UNIT/ML injection Inject 100 Units into the skin. 18 units AM & 10 units PM   levothyroxine (SYNTHROID) 88 MCG tablet Take 1 tablet (88 mcg total) by mouth daily.   metoCLOPramide (REGLAN) 5 MG tablet TAKE 1 TABLET BY MOUTH 3 TIMES DAILY BEFORE MEALS    mirtazapine (REMERON) 15 MG tablet TAKE 1 TABLET BY MOUTH EVERYDAY AT BEDTIME   pantoprazole (PROTONIX) 40 MG tablet TAKE 1 TABLET BY MOUTH EVERY DAY   potassium chloride (KLOR-CON) 20 MEQ packet Take by mouth 2 (two) times daily.   rosuvastatin (CRESTOR) 40 MG tablet TAKE 1 TABLET BY MOUTH EVERY DAY   sodium bicarbonate 650 MG tablet Take 650 mg by mouth 3 (three) times daily.   [DISCONTINUED] LEVEMIR FLEXTOUCH 100 UNIT/ML FlexPen INJECT 22 UNITS INTO THE SKIN IN THE MORNING AND 18 UNITS AT BEDTIME. (Patient taking differently: 24am & 20PM)   No facility-administered encounter medications on file as of 09/13/2022.    Allergies  Allergen Reactions   Penicillins Anaphylaxis and Hives    Tolerates zosyn Has patient had a PCN reaction causing immediate rash, facial/tongue/throat swelling, SOB or lightheadedness with hypotension: Yes Has patient had a PCN reaction causing severe rash involving mucus membranes or skin necrosis: No Has patient had a PCN reaction that required hospitalization: Yes Has patient had a PCN reaction occurring within the last 10 years: No If all of the above answers are "NO", then may proceed with Cephalosporin use.    Sulfa Antibiotics Anaphylaxis, Swelling and Other (See Comments)    Stiff neck also    Ciprofloxacin Other (See Comments)   Macrobid [Nitrofurantoin Monohyd Macro] Other (See Comments)    Skin peels off   Other Other (See Comments)   Ciprofloxacin Hcl Other (See Comments)    Skin peeling    Review of Systems  Constitutional:  Positive for activity change and fatigue. Negative for appetite change, chills, diaphoresis, fever and unexpected weight change.  HENT: Negative.    Eyes: Negative.   Respiratory:  Negative for cough, chest tightness and shortness of breath.   Cardiovascular:  Negative for  chest pain, palpitations and leg swelling.  Gastrointestinal:  Negative for abdominal pain, blood in stool, constipation, diarrhea, nausea and vomiting.   Endocrine: Negative.  Negative for polydipsia, polyphagia and polyuria.  Genitourinary:  Negative for decreased urine volume, difficulty urinating, dysuria, frequency and urgency.  Musculoskeletal:  Negative for arthralgias and myalgias.  Skin: Negative.   Allergic/Immunologic: Negative.   Neurological:  Negative for dizziness and headaches.  Hematological: Negative.   Psychiatric/Behavioral:  Positive for sleep disturbance. Negative for agitation, behavioral problems, confusion, decreased concentration, dysphoric mood, hallucinations, self-injury and suicidal ideas. The patient is nervous/anxious. The patient is not hyperactive.   All other systems reviewed and are negative.       Objective:  BP (!) 148/57   Pulse 67   Temp (!) 96.9 F (36.1 C) (Temporal)   Ht 5\' 3"  (1.6 m)   Wt 172 lb 12.8 oz (78.4 kg)   SpO2 96%   BMI 30.61 kg/m    Wt Readings from Last 3 Encounters:  09/13/22 172 lb 12.8 oz (78.4 kg)  07/18/22 173 lb 1 oz (78.5 kg)  06/13/22 173 lb 1 oz (78.5 kg)    Physical Exam Vitals and nursing note reviewed.  Constitutional:      General: She is not in acute distress.    Appearance: She is obese. She is ill-appearing (chronically ill). She is not toxic-appearing or diaphoretic.  HENT:     Head: Normocephalic and atraumatic.     Mouth/Throat:     Mouth: Mucous membranes are moist.  Eyes:     Pupils: Pupils are equal, round, and reactive to light.  Cardiovascular:     Rate and Rhythm: Normal rate and regular rhythm.     Heart sounds: Normal heart sounds.  Pulmonary:     Effort: Pulmonary effort is normal.     Breath sounds: Normal breath sounds.  Musculoskeletal:     Right lower leg: No edema.     Left lower leg: No edema.  Skin:    General: Skin is warm and dry.     Capillary Refill: Capillary refill takes less than 2 seconds.  Neurological:     General: No focal deficit present.     Mental Status: She is alert and oriented to person, place, and time.   Psychiatric:        Mood and Affect: Mood normal.        Behavior: Behavior normal.        Thought Content: Thought content normal.        Judgment: Judgment normal.     Results for orders placed or performed during the hospital encounter of 09/03/22  Hemoglobin-hemacue, POC  Result Value Ref Range   Hemoglobin 9.2 (L) 12.0 - 15.0 g/dL       Pertinent labs & imaging results that were available during my care of the patient were reviewed by me and considered in my medical decision making.  Assessment & Plan:  Richardine was seen today for depression.  Diagnoses and all orders for this visit:  Recurrent depression ([place]) Doing well on current regimen. Denies SI or HI. Continue current medications.   Chronic combined systolic and diastolic congestive heart failure ([place]) Well compensated in office. Aware to make follow up with cardiology.   Type 1 diabetes mellitus with hyperglycemia ([place]) Followed by endocrinology on a regular basis and had recent labs, those results have been requested.   Anemia in stage 4 chronic kidney disease ([place]) Followed by Dr. Theador Hawthorne on a  regular basis and had recent labs, they have been requested. Currently on Epogen.   Need for vaccination -     Zoster Recombinant (Shingrix )     Continue all other maintenance medications.  Follow up plan: Return in about 6 months (around 03/14/2023), or if symptoms worsen or fail to improve, for CPE.   Continue healthy lifestyle choices, including diet (rich in fruits, vegetables, and lean proteins, and low in salt and simple carbohydrates) and exercise (at least 30 minutes of moderate physical activity daily).  Educational handout given for DM  The above assessment and management plan was discussed with the patient. The patient verbalized understanding of and has agreed to the management plan. Patient is aware to call the clinic if they develop any new symptoms or if symptoms persist or worsen. Patient is aware  when to return to the clinic for a follow-up visit. Patient educated on when it is appropriate to go to the emergency department.   Monia Pouch, FNP-C [facility] [phone]

## 2022-09-17 ENCOUNTER — Encounter (HOSPITAL_COMMUNITY): Admission: RE | Admit: 2022-09-17 | Payer: Medicare PPO | Source: Ambulatory Visit

## 2022-09-18 ENCOUNTER — Other Ambulatory Visit (HOSPITAL_COMMUNITY): Payer: Self-pay | Admitting: Internal Medicine

## 2022-09-24 ENCOUNTER — Encounter (HOSPITAL_COMMUNITY)
Admission: RE | Admit: 2022-09-24 | Discharge: 2022-09-24 | Disposition: A | Discharge status: Home / Self Care | Payer: Medicare PPO | Source: Ambulatory Visit | Attending: Nephrology | Admitting: Nephrology

## 2022-09-24 VITALS — BP 153/73 | HR 58 | Temp 98.0°F | Resp 18

## 2022-09-24 DIAGNOSIS — D631 Anemia in chronic kidney disease: Secondary | ICD-10-CM | POA: Insufficient documentation

## 2022-09-24 DIAGNOSIS — N184 Chronic kidney disease, stage 4 (severe): Secondary | ICD-10-CM | POA: Insufficient documentation

## 2022-09-24 LAB — POCT HEMOGLOBIN-HEMACUE: Hemoglobin: 9.8 g/dL — ABNORMAL LOW (ref 12.0–15.0)

## 2022-09-24 MED ORDER — EPOETIN ALFA-EPBX 10000 UNIT/ML IJ SOLN
10000.0000 [IU] | Freq: Once | INTRAMUSCULAR | Status: AC
  Administered 2022-09-24: 10000 [IU] via SUBCUTANEOUS

## 2022-09-24 NOTE — Progress Notes (Signed)
Diagnosis: Anemia in Chronic Kidney Disease  Provider:  Manpreet Bhutani MD  Procedure: Injection  Retacrit (epoetin alfa-epbx), Dose: 10000 Units, Site: subcutaneous, Number of injections: 1  HGB 9.8  Post Care: Patient declined observation  Discharge: Condition: Good, Destination: Home . AVS provided to patient.   Performed by:  Grayland Ormond, RN

## 2022-09-27 ENCOUNTER — Encounter: Payer: Self-pay | Admitting: Family Medicine

## 2022-10-01 ENCOUNTER — Encounter (HOSPITAL_COMMUNITY): Payer: Medicare PPO

## 2022-10-07 ENCOUNTER — Encounter (HOSPITAL_COMMUNITY)
Admission: RE | Admit: 2022-10-07 | Discharge: 2022-10-07 | Disposition: A | Discharge status: Home / Self Care | Payer: Medicare PPO | Source: Ambulatory Visit | Attending: Nephrology | Admitting: Nephrology

## 2022-10-07 VITALS — BP 135/39 | HR 64 | Temp 98.4°F | Resp 16

## 2022-10-07 DIAGNOSIS — D631 Anemia in chronic kidney disease: Secondary | ICD-10-CM

## 2022-10-07 DIAGNOSIS — N184 Chronic kidney disease, stage 4 (severe): Secondary | ICD-10-CM | POA: Diagnosis not present

## 2022-10-07 LAB — POCT HEMOGLOBIN-HEMACUE: Hemoglobin: 9.8 g/dL — ABNORMAL LOW (ref 12.0–15.0)

## 2022-10-07 MED ORDER — EPOETIN ALFA-EPBX 10000 UNIT/ML IJ SOLN
10000.0000 [IU] | Freq: Once | INTRAMUSCULAR | Status: AC
  Administered 2022-10-07: 10000 [IU] via SUBCUTANEOUS
  Filled 2022-10-07: qty 1

## 2022-10-07 NOTE — Progress Notes (Signed)
Diagnosis: Anemia in Chronic Kidney Disease  Provider:  Manpreet Bhutani MD  Procedure: Injection  Retacrit (epoetin alfa-epbx), Dose: 10000 Units, Site: subcutaneous, Number of injections: 1  Hgb 9.8  Post Care: Patient declined observation  Discharge: Condition: Good, Destination: Home . Patient declined AVS.  Performed by:  Binnie Kand, RN

## 2022-10-07 NOTE — Addendum Note (Signed)
Encounter addended by: Baxter Hire, RN on: 10/07/2022 2:28 PM  Actions taken: Check Out activity completed

## 2022-10-07 NOTE — Addendum Note (Signed)
Encounter addended by: Baxter Hire, RN on: 10/07/2022 2:35 PM  Actions taken: Therapy plan modified

## 2022-10-08 ENCOUNTER — Encounter (HOSPITAL_COMMUNITY): Payer: Medicare PPO

## 2022-10-08 DIAGNOSIS — E1142 Type 2 diabetes mellitus with diabetic polyneuropathy: Secondary | ICD-10-CM | POA: Diagnosis not present

## 2022-10-08 DIAGNOSIS — M79676 Pain in unspecified toe(s): Secondary | ICD-10-CM | POA: Diagnosis not present

## 2022-10-08 DIAGNOSIS — B351 Tinea unguium: Secondary | ICD-10-CM | POA: Diagnosis not present

## 2022-10-08 DIAGNOSIS — L84 Corns and callosities: Secondary | ICD-10-CM | POA: Diagnosis not present

## 2022-10-11 ENCOUNTER — Other Ambulatory Visit: Payer: Self-pay | Admitting: Family Medicine

## 2022-10-11 DIAGNOSIS — I252 Old myocardial infarction: Secondary | ICD-10-CM

## 2022-10-11 DIAGNOSIS — E039 Hypothyroidism, unspecified: Secondary | ICD-10-CM

## 2022-10-11 DIAGNOSIS — F339 Major depressive disorder, recurrent, unspecified: Secondary | ICD-10-CM

## 2022-10-11 DIAGNOSIS — E785 Hyperlipidemia, unspecified: Secondary | ICD-10-CM

## 2022-10-11 DIAGNOSIS — I2510 Atherosclerotic heart disease of native coronary artery without angina pectoris: Secondary | ICD-10-CM

## 2022-10-11 DIAGNOSIS — K219 Gastro-esophageal reflux disease without esophagitis: Secondary | ICD-10-CM

## 2022-10-11 DIAGNOSIS — E1043 Type 1 diabetes mellitus with diabetic autonomic (poly)neuropathy: Secondary | ICD-10-CM

## 2022-10-15 ENCOUNTER — Encounter (HOSPITAL_COMMUNITY): Payer: Medicare PPO

## 2022-10-21 ENCOUNTER — Encounter (HOSPITAL_COMMUNITY)
Admission: RE | Admit: 2022-10-21 | Discharge: 2022-10-21 | Disposition: A | Discharge status: Home / Self Care | Payer: Medicare PPO | Source: Ambulatory Visit | Attending: Nephrology | Admitting: Nephrology

## 2022-10-21 VITALS — BP 132/55 | HR 65 | Temp 98.4°F | Resp 16

## 2022-10-21 DIAGNOSIS — D631 Anemia in chronic kidney disease: Secondary | ICD-10-CM | POA: Insufficient documentation

## 2022-10-21 DIAGNOSIS — N184 Chronic kidney disease, stage 4 (severe): Secondary | ICD-10-CM | POA: Diagnosis not present

## 2022-10-21 LAB — POCT HEMOGLOBIN-HEMACUE: Hemoglobin: 10.3 g/dL — ABNORMAL LOW (ref 12.0–15.0)

## 2022-10-21 MED ORDER — EPOETIN ALFA-EPBX 10000 UNIT/ML IJ SOLN: 10000.0000 [IU] | Freq: Once | INTRAMUSCULAR | Status: DC

## 2022-10-21 NOTE — Addendum Note (Signed)
Encounter addended by: Ramond Craver, [facility] on: 10/21/2022 2:24 PM  Actions taken: Order list changed

## 2022-10-21 NOTE — Progress Notes (Signed)
Diagnosis: Anemia in Chronic Kidney Disease  Provider:  Manpreet Bhutani MD  Procedure: Injection  Retacrit (epoetin alfa-epbx), Dose: 10000 Units, Site: subcutaneous, Number of injections: 0  Hgb 10.3. Injection not administered.  Post Care: Patient declined observation  Discharge: Condition: Stable, Destination: Home . AVS declined.  Performed by:  Binnie Kand, RN

## 2022-10-22 ENCOUNTER — Encounter (HOSPITAL_COMMUNITY): Payer: Medicare PPO

## 2022-10-29 ENCOUNTER — Encounter (HOSPITAL_COMMUNITY): Payer: Medicare PPO

## 2022-11-04 ENCOUNTER — Encounter (HOSPITAL_COMMUNITY)
Admission: RE | Admit: 2022-11-04 | Discharge: 2022-11-04 | Disposition: A | Discharge status: Home / Self Care | Payer: Medicare PPO | Source: Ambulatory Visit | Attending: Nephrology | Admitting: Nephrology

## 2022-11-04 VITALS — BP 144/55 | HR 65 | Temp 97.9°F | Resp 12

## 2022-11-04 DIAGNOSIS — D631 Anemia in chronic kidney disease: Secondary | ICD-10-CM | POA: Diagnosis not present

## 2022-11-04 DIAGNOSIS — N184 Chronic kidney disease, stage 4 (severe): Secondary | ICD-10-CM | POA: Diagnosis not present

## 2022-11-04 LAB — POCT HEMOGLOBIN-HEMACUE: Hemoglobin: 9.8 g/dL — ABNORMAL LOW (ref 12.0–15.0)

## 2022-11-04 MED ORDER — EPOETIN ALFA-EPBX 10000 UNIT/ML IJ SOLN
10000.0000 [IU] | Freq: Once | INTRAMUSCULAR | Status: AC
  Administered 2022-11-04: 10000 [IU] via SUBCUTANEOUS

## 2022-11-04 NOTE — Progress Notes (Signed)
Diagnosis: Anemia in Chronic Kidney Disease  Provider:  Manpreet Bhutani MD  Procedure: Injection  Retacrit (epoetin alfa-epbx), Dose: 10000 Units, Site: subcutaneous, Number of injections: 1  Hgb 9.8  Post Care: Patient declined observation  Discharge: Condition: Good, Destination: Home . AVS Provided  Performed by:  Binnie Kand, RN

## 2022-11-04 NOTE — Addendum Note (Signed)
Encounter addended by: Baxter Hire, RN on: 11/04/2022 3:17 PM  Actions taken: Therapy plan modified

## 2022-11-05 ENCOUNTER — Encounter (HOSPITAL_COMMUNITY): Payer: Medicare PPO

## 2022-11-08 DIAGNOSIS — N189 Chronic kidney disease, unspecified: Secondary | ICD-10-CM | POA: Diagnosis not present

## 2022-11-08 DIAGNOSIS — E1122 Type 2 diabetes mellitus with diabetic chronic kidney disease: Secondary | ICD-10-CM | POA: Diagnosis not present

## 2022-11-08 DIAGNOSIS — I129 Hypertensive chronic kidney disease with stage 1 through stage 4 chronic kidney disease, or unspecified chronic kidney disease: Secondary | ICD-10-CM | POA: Diagnosis not present

## 2022-11-08 DIAGNOSIS — I5042 Chronic combined systolic (congestive) and diastolic (congestive) heart failure: Secondary | ICD-10-CM | POA: Diagnosis not present

## 2022-11-08 DIAGNOSIS — D631 Anemia in chronic kidney disease: Secondary | ICD-10-CM | POA: Diagnosis not present

## 2022-11-08 DIAGNOSIS — E1129 Type 2 diabetes mellitus with other diabetic kidney complication: Secondary | ICD-10-CM | POA: Diagnosis not present

## 2022-11-08 DIAGNOSIS — R809 Proteinuria, unspecified: Secondary | ICD-10-CM | POA: Diagnosis not present

## 2022-11-12 ENCOUNTER — Encounter (HOSPITAL_COMMUNITY): Payer: Medicare PPO

## 2022-11-15 DIAGNOSIS — I5042 Chronic combined systolic (congestive) and diastolic (congestive) heart failure: Secondary | ICD-10-CM | POA: Diagnosis not present

## 2022-11-15 DIAGNOSIS — N189 Chronic kidney disease, unspecified: Secondary | ICD-10-CM | POA: Diagnosis not present

## 2022-11-15 DIAGNOSIS — E1122 Type 2 diabetes mellitus with diabetic chronic kidney disease: Secondary | ICD-10-CM | POA: Diagnosis not present

## 2022-11-15 DIAGNOSIS — I129 Hypertensive chronic kidney disease with stage 1 through stage 4 chronic kidney disease, or unspecified chronic kidney disease: Secondary | ICD-10-CM | POA: Diagnosis not present

## 2022-11-15 DIAGNOSIS — D631 Anemia in chronic kidney disease: Secondary | ICD-10-CM | POA: Diagnosis not present

## 2022-11-15 DIAGNOSIS — R809 Proteinuria, unspecified: Secondary | ICD-10-CM | POA: Diagnosis not present

## 2022-11-15 DIAGNOSIS — E1129 Type 2 diabetes mellitus with other diabetic kidney complication: Secondary | ICD-10-CM | POA: Diagnosis not present

## 2022-11-18 ENCOUNTER — Encounter (HOSPITAL_COMMUNITY)
Admission: RE | Admit: 2022-11-18 | Discharge: 2022-11-18 | Disposition: A | Discharge status: Home / Self Care | Payer: Medicare PPO | Source: Ambulatory Visit | Attending: Nephrology | Admitting: Nephrology

## 2022-11-18 VITALS — BP 133/57 | HR 67 | Temp 98.2°F | Resp 16

## 2022-11-18 DIAGNOSIS — D631 Anemia in chronic kidney disease: Secondary | ICD-10-CM | POA: Insufficient documentation

## 2022-11-18 DIAGNOSIS — N184 Chronic kidney disease, stage 4 (severe): Secondary | ICD-10-CM | POA: Diagnosis not present

## 2022-11-18 LAB — POCT HEMOGLOBIN-HEMACUE: Hemoglobin: 11.4 g/dL — ABNORMAL LOW (ref 12.0–15.0)

## 2022-11-18 MED ORDER — EPOETIN ALFA-EPBX 10000 UNIT/ML IJ SOLN: 10000.0000 [IU] | Freq: Once | INTRAMUSCULAR | Status: DC

## 2022-11-18 NOTE — Addendum Note (Signed)
Encounter addended by: Cristy Friedlander, [facility] on: 11/18/2022 2:35 PM  Actions taken: Order list changed

## 2022-11-18 NOTE — Progress Notes (Signed)
.  Diagnosis: Anemia in Chronic Kidney Disease  Provider:  Manpreet Bhutani MD  Procedure: Injection  Retacrit (epoetin alfa-epbx), Dose: 10000 Units, Site: subcutaneous, Number of injections: 0  Hgb 11.4. Injection was not administered.  Post Care: Patient declined observation  Discharge: Condition: Good, Destination: Home . AVS Provided  Performed by:  Binnie Kand, RN

## 2022-11-19 ENCOUNTER — Encounter (HOSPITAL_COMMUNITY): Payer: Medicare PPO

## 2022-12-02 ENCOUNTER — Encounter (HOSPITAL_COMMUNITY)
Admission: RE | Admit: 2022-12-02 | Discharge: 2022-12-02 | Disposition: A | Discharge status: Home / Self Care | Payer: Medicare PPO | Source: Ambulatory Visit | Attending: Nephrology | Admitting: Nephrology

## 2022-12-02 VITALS — BP 146/53 | HR 66 | Temp 98.4°F | Resp 16

## 2022-12-02 DIAGNOSIS — D631 Anemia in chronic kidney disease: Secondary | ICD-10-CM | POA: Diagnosis not present

## 2022-12-02 DIAGNOSIS — N184 Chronic kidney disease, stage 4 (severe): Secondary | ICD-10-CM | POA: Diagnosis not present

## 2022-12-02 MED ORDER — EPOETIN ALFA-EPBX 10000 UNIT/ML IJ SOLN
10000.0000 [IU] | Freq: Once | INTRAMUSCULAR | Status: AC
  Administered 2022-12-02: 10000 [IU] via SUBCUTANEOUS

## 2022-12-02 NOTE — Progress Notes (Signed)
Diagnosis: Anemia in Chronic Kidney Disease  Provider:  Manpreet Bhutani MD  Procedure: Injection  Retacrit (epoetin alfa-epbx), Dose: 10000 Units, Site: subcutaneous, Number of injections: 1  Hgb 8.9. Injection administered in right arm.  Post Care: Patient declined observation  Discharge: Condition: Stable, Destination: Home . AVS Declined  Performed by:  Binnie Kand, RN

## 2022-12-02 NOTE — Addendum Note (Signed)
Encounter addended by: Baxter Hire, RN on: 12/02/2022 2:29 PM  Actions taken: Therapy plan modified

## 2022-12-03 DIAGNOSIS — E1042 Type 1 diabetes mellitus with diabetic polyneuropathy: Secondary | ICD-10-CM | POA: Diagnosis not present

## 2022-12-03 LAB — POCT HEMOGLOBIN-HEMACUE: Hemoglobin: 8.9 g/dL — ABNORMAL LOW (ref 12.0–15.0)

## 2022-12-06 DIAGNOSIS — U071 COVID-19: Secondary | ICD-10-CM | POA: Diagnosis not present

## 2022-12-16 ENCOUNTER — Other Ambulatory Visit: Payer: Self-pay | Admitting: Family Medicine

## 2022-12-16 ENCOUNTER — Encounter (HOSPITAL_COMMUNITY): Admission: RE | Admit: 2022-12-16 | Payer: Medicare PPO | Source: Ambulatory Visit

## 2022-12-16 DIAGNOSIS — E1043 Type 1 diabetes mellitus with diabetic autonomic (poly)neuropathy: Secondary | ICD-10-CM

## 2022-12-17 DIAGNOSIS — E1043 Type 1 diabetes mellitus with diabetic autonomic (poly)neuropathy: Secondary | ICD-10-CM | POA: Diagnosis not present

## 2022-12-17 DIAGNOSIS — R110 Nausea: Secondary | ICD-10-CM | POA: Diagnosis not present

## 2022-12-17 DIAGNOSIS — E1042 Type 1 diabetes mellitus with diabetic polyneuropathy: Secondary | ICD-10-CM | POA: Diagnosis not present

## 2022-12-17 DIAGNOSIS — Z794 Long term (current) use of insulin: Secondary | ICD-10-CM | POA: Diagnosis not present

## 2022-12-23 ENCOUNTER — Encounter (HOSPITAL_COMMUNITY)
Admission: RE | Admit: 2022-12-23 | Discharge: 2022-12-23 | Disposition: A | Discharge status: Home / Self Care | Payer: Medicare PPO | Source: Ambulatory Visit | Attending: Nephrology | Admitting: Nephrology

## 2022-12-23 VITALS — BP 109/54 | HR 67 | Temp 98.5°F | Resp 16

## 2022-12-23 DIAGNOSIS — N184 Chronic kidney disease, stage 4 (severe): Secondary | ICD-10-CM | POA: Diagnosis not present

## 2022-12-23 DIAGNOSIS — D631 Anemia in chronic kidney disease: Secondary | ICD-10-CM | POA: Diagnosis not present

## 2022-12-23 LAB — POCT HEMOGLOBIN-HEMACUE: Hemoglobin: 10.5 g/dL — ABNORMAL LOW (ref 12.0–15.0)

## 2022-12-23 MED ORDER — EPOETIN ALFA-EPBX 10000 UNIT/ML IJ SOLN: 10000.0000 [IU] | Freq: Once | INTRAMUSCULAR | Status: DC

## 2022-12-23 NOTE — Addendum Note (Signed)
Encounter addended by: Wyvonne Lenz, RN on: 12/23/2022 1:59 PM  Actions taken: Therapy plan modified

## 2022-12-23 NOTE — Progress Notes (Signed)
Diagnosis: Anemia in Chronic Kidney Disease  Provider:  Manpreet Bhutani MD  Procedure: Injection  Retacrit (epoetin alfa-epbx), Dose: 10000 Units, Site: subcutaneous, Number of injections: Not Given  Hgb 10.5   Post Care: Patient declined observation  Discharge: Condition: Good, Destination: Home . AVS Provided  Performed by:  Evelena Peat, RN

## 2022-12-23 NOTE — Addendum Note (Signed)
Encounter addended by: Tera Mater, [facility] on: 12/23/2022 2:05 PM  Actions taken: Order list changed

## 2022-12-30 ENCOUNTER — Encounter (HOSPITAL_COMMUNITY): Payer: Medicare PPO

## 2022-12-31 DIAGNOSIS — M79676 Pain in unspecified toe(s): Secondary | ICD-10-CM | POA: Diagnosis not present

## 2022-12-31 DIAGNOSIS — B351 Tinea unguium: Secondary | ICD-10-CM | POA: Diagnosis not present

## 2022-12-31 DIAGNOSIS — L84 Corns and callosities: Secondary | ICD-10-CM | POA: Diagnosis not present

## 2022-12-31 DIAGNOSIS — E1142 Type 2 diabetes mellitus with diabetic polyneuropathy: Secondary | ICD-10-CM | POA: Diagnosis not present

## 2023-01-06 ENCOUNTER — Encounter (HOSPITAL_COMMUNITY)
Admission: RE | Admit: 2023-01-06 | Discharge: 2023-01-06 | Disposition: A | Discharge status: Home / Self Care | Payer: Medicare PPO | Source: Ambulatory Visit | Attending: Nephrology | Admitting: Nephrology

## 2023-01-06 VITALS — BP 124/57 | HR 70 | Temp 98.6°F | Resp 18

## 2023-01-06 DIAGNOSIS — N184 Chronic kidney disease, stage 4 (severe): Secondary | ICD-10-CM | POA: Diagnosis not present

## 2023-01-06 DIAGNOSIS — D631 Anemia in chronic kidney disease: Secondary | ICD-10-CM

## 2023-01-06 LAB — POCT HEMOGLOBIN-HEMACUE: Hemoglobin: 9.3 g/dL — ABNORMAL LOW (ref 12.0–15.0)

## 2023-01-06 MED ORDER — EPOETIN ALFA-EPBX 10000 UNIT/ML IJ SOLN
10000.0000 [IU] | Freq: Once | INTRAMUSCULAR | Status: AC
  Administered 2023-01-06: 10000 [IU] via SUBCUTANEOUS

## 2023-01-06 NOTE — Addendum Note (Signed)
Encounter addended by: Patrycja Mumpower E, RN on: 01/06/2023 3:53 PM  Actions taken: Therapy plan modified

## 2023-01-06 NOTE — Progress Notes (Signed)
Diagnosis: Anemia in Chronic Kidney Disease  Provider:  Manpreet Bhutani MD  Procedure: Injection  Retacrit (epoetin alfa-epbx), Dose: 10000 Units, Site: subcutaneous, Number of injections: 1  Post Care: Patient declined observation  Discharge: Condition: Good, Destination: Home . AVS Provided  Performed by:  Marin Shutter, RN       '

## 2023-01-16 ENCOUNTER — Other Ambulatory Visit: Payer: Self-pay | Admitting: Family Medicine

## 2023-01-16 DIAGNOSIS — F339 Major depressive disorder, recurrent, unspecified: Secondary | ICD-10-CM

## 2023-01-20 ENCOUNTER — Encounter (HOSPITAL_COMMUNITY)
Admission: RE | Admit: 2023-01-20 | Discharge: 2023-01-20 | Disposition: A | Discharge status: Home / Self Care | Payer: Medicare PPO | Source: Ambulatory Visit | Attending: Nephrology | Admitting: Nephrology

## 2023-01-20 VITALS — BP 110/44 | HR 63 | Temp 97.8°F | Resp 14

## 2023-01-20 DIAGNOSIS — N184 Chronic kidney disease, stage 4 (severe): Secondary | ICD-10-CM | POA: Insufficient documentation

## 2023-01-20 DIAGNOSIS — D631 Anemia in chronic kidney disease: Secondary | ICD-10-CM | POA: Insufficient documentation

## 2023-01-20 LAB — POCT HEMOGLOBIN-HEMACUE: Hemoglobin: 10 g/dL — ABNORMAL LOW (ref 12.0–15.0)

## 2023-01-20 MED ORDER — EPOETIN ALFA-EPBX 10000 UNIT/ML IJ SOLN: 10000.0000 [IU] | Freq: Once | INTRAMUSCULAR | Status: DC

## 2023-01-20 NOTE — Addendum Note (Signed)
Encounter addended by: Niva Murren E, RN on: 01/20/2023 3:59 PM  Actions taken: Therapy plan modified

## 2023-01-20 NOTE — Progress Notes (Signed)
Diagnosis: Anemia in Chronic Kidney Disease  Provider:  Manpreet Bhutani MD  Procedure: Injection  Retacrit (epoetin alfa-epbx), Dose: 10000 Units, Site: subcutaneous, Number of injections: Not Given  Hgb 10.0   Post Care: Patient declined observation  Discharge: Condition: Good, Destination: Home . AVS declined.  Performed by:  Wyvonne Lenz, RN

## 2023-01-24 DIAGNOSIS — D631 Anemia in chronic kidney disease: Secondary | ICD-10-CM | POA: Diagnosis not present

## 2023-01-24 DIAGNOSIS — E1043 Type 1 diabetes mellitus with diabetic autonomic (poly)neuropathy: Secondary | ICD-10-CM | POA: Diagnosis not present

## 2023-01-24 DIAGNOSIS — E211 Secondary hyperparathyroidism, not elsewhere classified: Secondary | ICD-10-CM | POA: Diagnosis not present

## 2023-01-24 DIAGNOSIS — E1042 Type 1 diabetes mellitus with diabetic polyneuropathy: Secondary | ICD-10-CM | POA: Diagnosis not present

## 2023-01-24 DIAGNOSIS — N189 Chronic kidney disease, unspecified: Secondary | ICD-10-CM | POA: Diagnosis not present

## 2023-01-24 DIAGNOSIS — Z794 Long term (current) use of insulin: Secondary | ICD-10-CM | POA: Diagnosis not present

## 2023-01-24 DIAGNOSIS — N184 Chronic kidney disease, stage 4 (severe): Secondary | ICD-10-CM | POA: Diagnosis not present

## 2023-01-24 DIAGNOSIS — R809 Proteinuria, unspecified: Secondary | ICD-10-CM | POA: Diagnosis not present

## 2023-01-24 DIAGNOSIS — Z79899 Other long term (current) drug therapy: Secondary | ICD-10-CM | POA: Diagnosis not present

## 2023-02-03 DIAGNOSIS — I5042 Chronic combined systolic (congestive) and diastolic (congestive) heart failure: Secondary | ICD-10-CM | POA: Diagnosis not present

## 2023-02-03 DIAGNOSIS — N184 Chronic kidney disease, stage 4 (severe): Secondary | ICD-10-CM | POA: Diagnosis not present

## 2023-02-03 DIAGNOSIS — R809 Proteinuria, unspecified: Secondary | ICD-10-CM | POA: Diagnosis not present

## 2023-02-03 DIAGNOSIS — D631 Anemia in chronic kidney disease: Secondary | ICD-10-CM | POA: Diagnosis not present

## 2023-02-04 ENCOUNTER — Encounter (HOSPITAL_COMMUNITY)
Admission: RE | Admit: 2023-02-04 | Discharge: 2023-02-04 | Disposition: A | Discharge status: Home / Self Care | Payer: Medicare PPO | Source: Ambulatory Visit | Attending: Nephrology | Admitting: Nephrology

## 2023-02-04 VITALS — BP 126/39 | Temp 98.2°F | Resp 16

## 2023-02-04 DIAGNOSIS — N184 Chronic kidney disease, stage 4 (severe): Secondary | ICD-10-CM | POA: Diagnosis not present

## 2023-02-04 DIAGNOSIS — D631 Anemia in chronic kidney disease: Secondary | ICD-10-CM

## 2023-02-04 MED ORDER — EPOETIN ALFA-EPBX 10000 UNIT/ML IJ SOLN
10000.0000 [IU] | Freq: Once | INTRAMUSCULAR | Status: AC
  Administered 2023-02-04: 10000 [IU] via SUBCUTANEOUS

## 2023-02-04 NOTE — Progress Notes (Signed)
Diagnosis: Anemia in Chronic Kidney Disease  Provider:  Manpreet Bhutani MD  Procedure: Injection  Retacrit (epoetin alfa-epbx), Dose: 10000 Units, Site: subcutaneous, Number of injections: 1  Hgb 9.5, drawn by [facility]. Per family, Dr. Wolfgang Phoenix stated patient to receive her Retacrit today based on recent [facility] bloodwork, as discussed in provider appointment yesterday. Confirmed value of 9.5 on [facility] website and patient's son had a copy of the results on his device.  Post Care: Patient declined observation  Discharge: Condition: Good, Destination: Home . AVS declined.  Performed by:  Wyvonne Lenz, RN

## 2023-02-04 NOTE — Addendum Note (Signed)
Encounter addended by: Wyvonne Lenz, RN on: 02/04/2023 3:16 PM  Actions taken: Therapy plan modified

## 2023-02-17 ENCOUNTER — Encounter (HOSPITAL_COMMUNITY)
Admission: RE | Admit: 2023-02-17 | Discharge: 2023-02-17 | Disposition: A | Discharge status: Home / Self Care | Payer: Medicare PPO | Source: Ambulatory Visit | Attending: Nephrology | Admitting: Nephrology

## 2023-02-17 ENCOUNTER — Telehealth (HOSPITAL_COMMUNITY): Payer: Self-pay | Admitting: Cardiology

## 2023-02-17 ENCOUNTER — Telehealth (HOSPITAL_COMMUNITY): Payer: Self-pay

## 2023-02-17 VITALS — BP 86/41 | HR 60 | Temp 97.8°F | Resp 18

## 2023-02-17 DIAGNOSIS — D631 Anemia in chronic kidney disease: Secondary | ICD-10-CM | POA: Insufficient documentation

## 2023-02-17 DIAGNOSIS — N184 Chronic kidney disease, stage 4 (severe): Secondary | ICD-10-CM | POA: Insufficient documentation

## 2023-02-17 MED ORDER — EPOETIN ALFA-EPBX 10000 UNIT/ML IJ SOLN
10000.0000 [IU] | Freq: Once | INTRAMUSCULAR | Status: AC
  Administered 2023-02-17: 10000 [IU] via SUBCUTANEOUS

## 2023-02-17 NOTE — Telephone Encounter (Signed)
Called and spoke with Chantel, CMA at Dr. Prescott Gum office ([facility], [phone]). Relayed that patient presented today for Retacrit injection with BP 86/41 (MAP 55). Patient asymptomatic; stated that she had taken her morning dose of carvedilol but not her afternoon dose. Chantel relayed message to provider and this RN received a detailed message in return.   Per detailed message, relayed orders from [facility], NP that patient should hold her afternoon dose of Coreg and Entresto. Relayed to patient that her systolic blood pressure needed to be greater than 95 before restarting medications, per provider. Patient confirmed she is able to check BP at home. Also relayed to patient that the provider would be reaching out to get her scheduled for an appointment.  Information was communicated verbally to both the patient and her son Annette Stable (present at appointment), and was also written on patient's printed AVS. Encouraged patient to move with care and note if any dizziness or other symptoms occurred. All questions answered.  Wyvonne Lenz, RN

## 2023-02-17 NOTE — Telephone Encounter (Signed)
Called and spoke with Liborio Nixon at Dr. Lucio Edward office ([facility], [phone]). Relayed that patient presented for Retacrit appointment today with BP 86/41 (MAP 55). Patient stated asymptomatic. Confirmed per Dr. Wolfgang Phoenix, ok to administer Retacrit injection today; no change in order parameters.  Hemoglobin 9.7 today; injection administered per order (see infusion clinic note).  Wyvonne Lenz, RN

## 2023-02-17 NOTE — Telephone Encounter (Signed)
Erin with [facility] called with low b/p 86/41 Pt present for redacrit infusion Per nephrology ok to proceed with b/p  however concerned with afternoon doses of b/p meds   Above reviewed with Prince Rome, NP Hold evening coreg and entresto  SBP greater than 95  Check b/p at home Schedule follow up   Detailed message left at [phone]

## 2023-02-17 NOTE — Addendum Note (Signed)
Encounter addended by: Wyvonne Lenz, RN on: 02/17/2023 3:51 PM  Actions taken: Therapy plan modified

## 2023-02-17 NOTE — Progress Notes (Signed)
Diagnosis: Anemia in Chronic Kidney Disease  Provider:  Manpreet Bhutani MD  Procedure: Injection  Retacrit (epoetin alfa-epbx), Dose: 10000 Units, Site: subcutaneous, Number of injections: 1  Hgb 9.7.  Post Care: Patient declined observation  Discharge: Condition: Fair, Destination: Home . AVS provided.  Performed by:  Wyvonne Lenz, RN   Patient presented with low BP at today's appointment. See telephone notes to Dr. Wolfgang Phoenix ([facility]) and Dr. Gala Romney ([facility]).

## 2023-02-18 LAB — POCT HEMOGLOBIN-HEMACUE: Hemoglobin: 9.7 g/dL — ABNORMAL LOW (ref 12.0–15.0)

## 2023-03-03 ENCOUNTER — Encounter (HOSPITAL_COMMUNITY)
Admission: RE | Admit: 2023-03-03 | Discharge: 2023-03-03 | Disposition: A | Discharge status: Home / Self Care | Payer: Medicare PPO | Source: Ambulatory Visit | Attending: Nephrology | Admitting: Nephrology

## 2023-03-03 VITALS — BP 132/37 | HR 61 | Temp 98.3°F | Resp 20

## 2023-03-03 DIAGNOSIS — D631 Anemia in chronic kidney disease: Secondary | ICD-10-CM

## 2023-03-03 DIAGNOSIS — N184 Chronic kidney disease, stage 4 (severe): Secondary | ICD-10-CM | POA: Diagnosis not present

## 2023-03-03 DIAGNOSIS — E1042 Type 1 diabetes mellitus with diabetic polyneuropathy: Secondary | ICD-10-CM | POA: Diagnosis not present

## 2023-03-03 LAB — POCT HEMOGLOBIN-HEMACUE: Hemoglobin: 9.7 g/dL — ABNORMAL LOW (ref 12.0–15.0)

## 2023-03-03 MED ORDER — EPOETIN ALFA-EPBX 10000 UNIT/ML IJ SOLN
10000.0000 [IU] | Freq: Once | INTRAMUSCULAR | Status: AC
  Administered 2023-03-03: 10000 [IU] via SUBCUTANEOUS

## 2023-03-03 NOTE — Addendum Note (Signed)
Encounter addended by: Bekki Tavenner E, RN on: 03/03/2023 4:04 PM  Actions taken: Therapy plan modified

## 2023-03-03 NOTE — Progress Notes (Signed)
Diagnosis: Anemia in Chronic Kidney Disease  Provider:  Manpreet Bhutani MD  Procedure: Injection  Retacrit (epoetin alfa-epbx), Dose: 10000 Units, Site: subcutaneous, Number of injections: 1  Hgb 9.7.  Post Care: Patient declined observation  Discharge: Condition: Good, Destination: Home . AVS declined.  Performed by:  Wyvonne Lenz, RN

## 2023-03-17 ENCOUNTER — Encounter (HOSPITAL_COMMUNITY)
Admission: RE | Admit: 2023-03-17 | Discharge: 2023-03-17 | Disposition: A | Discharge status: Home / Self Care | Payer: Medicare PPO | Source: Ambulatory Visit | Attending: Nephrology | Admitting: Nephrology

## 2023-03-17 VITALS — BP 109/52 | HR 59 | Temp 98.2°F | Resp 18

## 2023-03-17 DIAGNOSIS — D631 Anemia in chronic kidney disease: Secondary | ICD-10-CM | POA: Diagnosis not present

## 2023-03-17 DIAGNOSIS — N184 Chronic kidney disease, stage 4 (severe): Secondary | ICD-10-CM | POA: Insufficient documentation

## 2023-03-17 LAB — POCT HEMOGLOBIN-HEMACUE: Hemoglobin: 9.2 g/dL — ABNORMAL LOW (ref 12.0–15.0)

## 2023-03-17 MED ORDER — EPOETIN ALFA-EPBX 10000 UNIT/ML IJ SOLN
10000.0000 [IU] | Freq: Once | INTRAMUSCULAR | Status: AC
  Administered 2023-03-17: 10000 [IU] via SUBCUTANEOUS

## 2023-03-17 NOTE — Progress Notes (Addendum)
Diagnosis: Anemia in Chronic Kidney Disease  Provider:  Celso Amy MD  Procedure: Injection  Retacrit (epoetin alfa-epbx), Dose: 10000 Units, Site: subcutaneous, Number of injections: 1  Hgb 9.2  Post Care: Patient declined observation  Discharge: Condition: Good, Destination: Home . AVS Declined  Performed by:  Claudia Desanctis, RN

## 2023-03-21 ENCOUNTER — Encounter: Payer: Medicare PPO | Admitting: Family Medicine

## 2023-03-28 DIAGNOSIS — E1042 Type 1 diabetes mellitus with diabetic polyneuropathy: Secondary | ICD-10-CM | POA: Diagnosis not present

## 2023-03-28 DIAGNOSIS — Z794 Long term (current) use of insulin: Secondary | ICD-10-CM | POA: Diagnosis not present

## 2023-03-28 DIAGNOSIS — E1043 Type 1 diabetes mellitus with diabetic autonomic (poly)neuropathy: Secondary | ICD-10-CM | POA: Diagnosis not present

## 2023-03-31 ENCOUNTER — Encounter (HOSPITAL_COMMUNITY): Admission: RE | Admit: 2023-03-31 | Payer: Medicare PPO | Source: Ambulatory Visit

## 2023-04-01 DIAGNOSIS — E11319 Type 2 diabetes mellitus with unspecified diabetic retinopathy without macular edema: Secondary | ICD-10-CM | POA: Diagnosis not present

## 2023-04-01 DIAGNOSIS — D631 Anemia in chronic kidney disease: Secondary | ICD-10-CM | POA: Diagnosis not present

## 2023-04-01 DIAGNOSIS — N184 Chronic kidney disease, stage 4 (severe): Secondary | ICD-10-CM | POA: Diagnosis not present

## 2023-04-01 DIAGNOSIS — E039 Hypothyroidism, unspecified: Secondary | ICD-10-CM | POA: Diagnosis not present

## 2023-04-01 DIAGNOSIS — I129 Hypertensive chronic kidney disease with stage 1 through stage 4 chronic kidney disease, or unspecified chronic kidney disease: Secondary | ICD-10-CM | POA: Diagnosis not present

## 2023-04-01 DIAGNOSIS — R809 Proteinuria, unspecified: Secondary | ICD-10-CM | POA: Diagnosis not present

## 2023-04-02 DIAGNOSIS — E1043 Type 1 diabetes mellitus with diabetic autonomic (poly)neuropathy: Secondary | ICD-10-CM | POA: Diagnosis not present

## 2023-04-04 DIAGNOSIS — I5042 Chronic combined systolic (congestive) and diastolic (congestive) heart failure: Secondary | ICD-10-CM | POA: Diagnosis not present

## 2023-04-04 DIAGNOSIS — E11319 Type 2 diabetes mellitus with unspecified diabetic retinopathy without macular edema: Secondary | ICD-10-CM | POA: Diagnosis not present

## 2023-04-04 DIAGNOSIS — D631 Anemia in chronic kidney disease: Secondary | ICD-10-CM | POA: Diagnosis not present

## 2023-04-04 DIAGNOSIS — N184 Chronic kidney disease, stage 4 (severe): Secondary | ICD-10-CM | POA: Diagnosis not present

## 2023-04-06 ENCOUNTER — Telehealth: Payer: Self-pay | Admitting: Pharmacy Technician

## 2023-04-06 NOTE — Telephone Encounter (Addendum)
Auth Submission: approved  Site of care: Site of care: [facility] Payer: [employer] MEDICARE Medication & CPT/J Code(s) submitted: RETACRIT [id number]   Route of submission (phone, fax, portal): PORTAL/AVAILTY Phone # Fax # Auth type: Buy/Bill PB Units/visits requested: 10,000 UNITS Q2WKS Reference number: [id number] Approval from: [id number] - 09/09/23

## 2023-04-07 ENCOUNTER — Encounter (HOSPITAL_COMMUNITY)
Admission: RE | Admit: 2023-04-07 | Discharge: 2023-04-07 | Disposition: A | Discharge status: Home / Self Care | Payer: Medicare PPO | Source: Ambulatory Visit | Attending: Nephrology | Admitting: Nephrology

## 2023-04-07 ENCOUNTER — Ambulatory Visit (INDEPENDENT_AMBULATORY_CARE_PROVIDER_SITE_OTHER): Payer: Medicare PPO

## 2023-04-07 VITALS — BP 122/51 | HR 64 | Temp 98.0°F | Resp 18

## 2023-04-07 VITALS — Ht 64.0 in | Wt 157.0 lb

## 2023-04-07 DIAGNOSIS — Z1231 Encounter for screening mammogram for malignant neoplasm of breast: Secondary | ICD-10-CM

## 2023-04-07 DIAGNOSIS — D631 Anemia in chronic kidney disease: Secondary | ICD-10-CM

## 2023-04-07 DIAGNOSIS — Z0000 Encounter for general adult medical examination without abnormal findings: Secondary | ICD-10-CM

## 2023-04-07 DIAGNOSIS — N184 Chronic kidney disease, stage 4 (severe): Secondary | ICD-10-CM | POA: Diagnosis not present

## 2023-04-07 LAB — POCT HEMOGLOBIN-HEMACUE: Hemoglobin: 10.1 g/dL — ABNORMAL LOW (ref 12.0–15.0)

## 2023-04-07 MED ORDER — EPOETIN ALFA-EPBX 10000 UNIT/ML IJ SOLN: 10000.0000 [IU] | Freq: Once | INTRAMUSCULAR | Status: DC

## 2023-04-07 NOTE — Progress Notes (Signed)
Diagnosis: Anemia in Chronic Kidney Disease  Provider:  Celso Amy MD  Procedure: Injection  Retacrit (epoetin alfa-epbx), Dose: 10000 Units, Site: subcutaneous, Number of injections: 0  Hgb 10.1 no injection needed  Post Care: Patient declined observation  Discharge: Condition: Good, Destination: Home . AVS Declined  Performed by:  Arrie Senate, RN

## 2023-04-07 NOTE — Patient Instructions (Signed)
Ms. Shelstad , Thank you for taking time to come for your Medicare Wellness Visit. I appreciate your ongoing commitment to your health goals. Please review the following plan we discussed and let me know if I can assist you in the future.   Referrals/Orders/Follow-Ups/Clinician Recommendations: Aim for 30 minutes of exercise or brisk walking, 6-8 glasses of water, and 5 servings of fruits and vegetables each day.   This is a list of the screening recommended for you and due dates:  Health Maintenance  Topic Date Due   DEXA scan (bone density measurement)  Never done   Eye exam for diabetics  10/14/2019   COVID-19 Vaccine (3 - Moderna risk series) 03/31/2020   Mammogram  04/20/2021   Hemoglobin A1C  10/27/2022   Yearly kidney health urinalysis for diabetes  01/19/2023   Flu Shot  04/10/2023   Yearly kidney function blood test for diabetes  06/08/2023   Colon Cancer Screening  11/19/2023   Medicare Annual Wellness Visit  04/06/2024   DTaP/Tdap/Td vaccine (2 - Td or Tdap) 10/06/2028   Pneumonia Vaccine  Completed   Hepatitis C Screening  Completed   Zoster (Shingles) Vaccine  Completed   HPV Vaccine  Aged Out   Complete foot exam   Discontinued    Advanced directives: (ACP Link)Information on Advanced Care Planning can be found at [facility] Advance Health Care Directives Advance Health Care Directives ([email or web address]   Next Medicare Annual Wellness Visit scheduled for next year: Yes  Preventive Care 65 Years and Older, Female Preventive care refers to lifestyle choices and visits with your health care provider that can promote health and wellness. What does preventive care include? A yearly physical exam. This is also called an annual well check. Dental exams once or twice a year. Routine eye exams. Ask your health care provider how often you should have your eyes checked. Personal lifestyle choices, including: Daily care of your teeth and gums. Regular physical  activity. Eating a healthy diet. Avoiding tobacco and drug use. Limiting alcohol use. Practicing safe sex. Taking low-dose aspirin every day. Taking vitamin and mineral supplements as recommended by your health care provider. What happens during an annual well check? The services and screenings done by your health care provider during your annual well check will depend on your age, overall health, lifestyle risk factors, and family history of disease. Counseling  Your health care provider may ask you questions about your: Alcohol use. Tobacco use. Drug use. Emotional well-being. Home and relationship well-being. Sexual activity. Eating habits. History of falls. Memory and ability to understand (cognition). Work and work Astronomer. Reproductive health. Screening  You may have the following tests or measurements: Height, weight, and BMI. Blood pressure. Lipid and cholesterol levels. These may be checked every 5 years, or more frequently if you are over 27 years old. Skin check. Lung cancer screening. You may have this screening every year starting at age 2 if you have a 30-pack-year history of smoking and currently smoke or have quit within the past 15 years. Fecal occult blood test (FOBT) of the stool. You may have this test every year starting at age 56. Flexible sigmoidoscopy or colonoscopy. You may have a sigmoidoscopy every 5 years or a colonoscopy every 10 years starting at age 36. Hepatitis C blood test. Hepatitis B blood test. Sexually transmitted disease (STD) testing. Diabetes screening. This is done by checking your blood sugar (glucose) after you have not eaten for a while (fasting). You may  have this done every 1-3 years. Bone density scan. This is done to screen for osteoporosis. You may have this done starting at age 64. Mammogram. This may be done every 1-2 years. Talk to your health care provider about how often you should have regular mammograms. Talk with your  health care provider about your test results, treatment options, and if necessary, the need for more tests. Vaccines  Your health care provider may recommend certain vaccines, such as: Influenza vaccine. This is recommended every year. Tetanus, diphtheria, and acellular pertussis (Tdap, Td) vaccine. You may need a Td booster every 10 years. Zoster vaccine. You may need this after age 70. Pneumococcal 13-valent conjugate (PCV13) vaccine. One dose is recommended after age 51. Pneumococcal polysaccharide (PPSV23) vaccine. One dose is recommended after age 3. Talk to your health care provider about which screenings and vaccines you need and how often you need them. This information is not intended to replace advice given to you by your health care provider. Make sure you discuss any questions you have with your health care provider. Document Released: 09/22/2015 Document Revised: 05/15/2016 Document Reviewed: 06/27/2015 Elsevier Interactive Patient Education  2017 [employer].  Fall Prevention in the Home Falls can cause injuries. They can happen to people of all ages. There are many things you can do to make your home safe and to help prevent falls. What can I do on the outside of my home? Regularly fix the edges of walkways and driveways and fix any cracks. Remove anything that might make you trip as you walk through a door, such as a raised step or threshold. Trim any bushes or trees on the path to your home. Use bright outdoor lighting. Clear any walking paths of anything that might make someone trip, such as rocks or tools. Regularly check to see if handrails are loose or broken. Make sure that both sides of any steps have handrails. Any raised decks and porches should have guardrails on the edges. Have any leaves, snow, or ice cleared regularly. Use sand or salt on walking paths during winter. Clean up any spills in your garage right away. This includes oil or grease spills. What can I  do in the bathroom? Use night lights. Install grab bars by the toilet and in the tub and shower. Do not use towel bars as grab bars. Use non-skid mats or decals in the tub or shower. If you need to sit down in the shower, use a plastic, non-slip stool. Keep the floor dry. Clean up any water that spills on the floor as soon as it happens. Remove soap buildup in the tub or shower regularly. Attach bath mats securely with double-sided non-slip rug tape. Do not have throw rugs and other things on the floor that can make you trip. What can I do in the bedroom? Use night lights. Make sure that you have a light by your bed that is easy to reach. Do not use any sheets or blankets that are too big for your bed. They should not hang down onto the floor. Have a firm chair that has side arms. You can use this for support while you get dressed. Do not have throw rugs and other things on the floor that can make you trip. What can I do in the kitchen? Clean up any spills right away. Avoid walking on wet floors. Keep items that you use a lot in easy-to-reach places. If you need to reach something above you, use a strong step  stool that has a grab bar. Keep electrical cords out of the way. Do not use floor polish or wax that makes floors slippery. If you must use wax, use non-skid floor wax. Do not have throw rugs and other things on the floor that can make you trip. What can I do with my stairs? Do not leave any items on the stairs. Make sure that there are handrails on both sides of the stairs and use them. Fix handrails that are broken or loose. Make sure that handrails are as long as the stairways. Check any carpeting to make sure that it is firmly attached to the stairs. Fix any carpet that is loose or worn. Avoid having throw rugs at the top or bottom of the stairs. If you do have throw rugs, attach them to the floor with carpet tape. Make sure that you have a light switch at the top of the stairs  and the bottom of the stairs. If you do not have them, ask someone to add them for you. What else can I do to help prevent falls? Wear shoes that: Do not have high heels. Have rubber bottoms. Are comfortable and fit you well. Are closed at the toe. Do not wear sandals. If you use a stepladder: Make sure that it is fully opened. Do not climb a closed stepladder. Make sure that both sides of the stepladder are locked into place. Ask someone to hold it for you, if possible. Clearly mark and make sure that you can see: Any grab bars or handrails. First and last steps. Where the edge of each step is. Use tools that help you move around (mobility aids) if they are needed. These include: Canes. Walkers. Scooters. Crutches. Turn on the lights when you go into a dark area. Replace any light bulbs as soon as they burn out. Set up your furniture so you have a clear path. Avoid moving your furniture around. If any of your floors are uneven, fix them. If there are any pets around you, be aware of where they are. Review your medicines with your doctor. Some medicines can make you feel dizzy. This can increase your chance of falling. Ask your doctor what other things that you can do to help prevent falls. This information is not intended to replace advice given to you by your health care provider. Make sure you discuss any questions you have with your health care provider. Document Released: 06/22/2009 Document Revised: 02/01/2016 Document Reviewed: 09/30/2014 Elsevier Interactive Patient Education  2017 [employer].

## 2023-04-07 NOTE — Progress Notes (Signed)
Subjective:   Bridget Martinez is a 72 y.o. female who presents for Medicare Annual (Subsequent) preventive examination.  Visit Complete: Virtual  I connected with  Bridget Martinez on 04/07/23 by a audio enabled telemedicine application and verified that I am speaking with the correct person using two identifiers.  Patient Location: Home  Provider Location: Home Office  I discussed the limitations of evaluation and management by telemedicine. The patient expressed understanding and agreed to proceed.  Patient Medicare AWV questionnaire was completed by the patient on 04/07/2023; I have confirmed that all information answered by patient is correct and no changes since this date. Vital Signs: Unable to obtain new vitals due to this being a telehealth visit.  Review of Systems     Cardiac Risk Factors include: advanced age (>54men, >40 women);diabetes mellitus;dyslipidemia;hypertension Nutrition Risk Assessment:  Has the patient had any N/V/D within the last 2 months?  No  Does the patient have any non-healing wounds?  No  Has the patient had any unintentional weight loss or weight gain?  No   Diabetes:  Is the patient diabetic?  Yes  If diabetic, was a CBG obtained today?  No  Did the patient bring in their glucometer from home?  No  How often do you monitor your CBG's? DEXCOM .   Financial Strains and Diabetes Management:  Are you having any financial strains with the device, your supplies or your medication? No .  Does the patient want to be seen by Chronic Care Management for management of their diabetes?  No  Would the patient like to be referred to a Nutritionist or for Diabetic Management?  No   Diabetic Exams:  Diabetic Eye Exam: Completed 04/2022 Diabetic Foot Exam: Overdue, Pt has been advised about the importance in completing this exam. Pt is scheduled for diabetic foot exam on next office visit .     Objective:    Today's Vitals   04/07/23 1209  Weight: 157  lb (71.2 kg)  Height: 5\' 4"  (1.626 m)   Body mass index is 26.95 kg/m.     04/07/2023   12:12 PM 04/03/2022   11:58 AM 02/16/2021    8:28 AM 02/16/2020    8:44 AM 01/21/2019    8:51 AM 03/27/2018    9:08 PM 03/06/2015   11:51 AM  Advanced Directives  Does Patient Have a Medical Advance Directive? Yes Yes No Yes No No No  Type of Estate agent of [place];Living will Healthcare Power of [place];Living will  Living will;Healthcare Power of Attorney     Does patient want to make changes to medical advance directive?    No - Patient declined     Copy of Healthcare Power of Attorney in Chart? No - copy requested No - copy requested  No - copy requested     Would patient like information on creating a medical advance directive?   Yes (MAU/Ambulatory/Procedural Areas - Information given) No - Patient declined Yes (MAU/Ambulatory/Procedural Areas - Information given) Yes (ED - Information included in AVS) No - patient declined information    Current Medications (verified) Outpatient Encounter Medications as of 04/07/2023  Medication Sig   aspirin 81 MG EC tablet Take 1 tablet (81 mg total) by mouth daily.   buPROPion (WELLBUTRIN XL) 150 MG 24 hr tablet TAKE 1 TABLET BY MOUTH TWICE A DAY   carvedilol (COREG) 12.5 MG tablet TAKE 1 TABLET (12.5MG  TOTAL) BY MOUTH TWICE A DAY WITH MEALS   Continuous  Blood Gluc Receiver (DEXCOM G6 RECEIVER) DEVI See admin instructions.   Continuous Blood Gluc Sensor (DEXCOM G6 SENSOR) MISC See admin instructions.   ENTRESTO 24-26 MG SMARTSIG:1 Tablet(s) By Mouth Morning-Evening   epoetin alfa (EPOGEN) 3000 UNIT/ML injection Inject into the skin.   insulin aspart (NOVOLOG FLEXPEN) 100 UNIT/ML FlexPen 13 to 17 units at breakfast, 7 to 11 units at lunch and at supper   insulin glargine (LANTUS) 100 UNIT/ML injection Inject 100 Units into the skin. 18 units AM & 10 units PM   levothyroxine (SYNTHROID) 88 MCG tablet TAKE 1 TABLET BY MOUTH EVERY DAY    metoCLOPramide (REGLAN) 5 MG tablet TAKE 1 TABLET BY MOUTH 3 TIMES DAILY BEFORE MEALS   mirtazapine (REMERON) 15 MG tablet TAKE 1 TABLET BY MOUTH EVERYDAY AT BEDTIME   pantoprazole (PROTONIX) 40 MG tablet TAKE 1 TABLET BY MOUTH EVERY DAY   potassium chloride (KLOR-CON) 20 MEQ packet Take by mouth 2 (two) times daily.   rosuvastatin (CRESTOR) 40 MG tablet TAKE 1 TABLET BY MOUTH EVERY DAY   sodium bicarbonate 650 MG tablet Take 650 mg by mouth 3 (three) times daily.   No facility-administered encounter medications on file as of 04/07/2023.    Allergies (verified) Penicillins, Sulfa antibiotics, Ciprofloxacin, Macrobid [nitrofurantoin monohyd macro], Other, and Ciprofloxacin hcl   History: Past Medical History:  Diagnosis Date   Arthritis    CHF (congestive heart failure) (HCC)    Coronary artery disease    angina.  MI.    Depression    anxiety   Diabetes mellitus    on insulin, with h/o DKA    GERD (gastroesophageal reflux disease)    Hiatal hernia.    HTN (hypertension)    Hyperlipidemia    Hypothyroidism    Left displaced femoral neck fracture (HCC) 11/19/2014   Myocardial infarction [facility])    NSTEMI 07/2011 with cardiogenic shock, s/p CABG 09/2011   Osteoporosis    Pneumonia        Tuberculosis    Venous stasis ulcers (HCC)    Past Surgical History:  Procedure Laterality Date   CARDIAC CATHETERIZATION     COLONOSCOPY N/A 11/18/2013   Procedure: COLONOSCOPY;  Surgeon: Hart Carwin, MD;  Location: [facility] ENDOSCOPY;  Service: Endoscopy;  Laterality: N/A;   CORONARY ARTERY BYPASS GRAFT     Dr. Zenaida Niece Trigt in 09/2011    CORONARY ARTERY BYPASS GRAFT  2012   ESOPHAGOGASTRODUODENOSCOPY N/A 11/17/2013   Procedure: ESOPHAGOGASTRODUODENOSCOPY (EGD);  Surgeon: Hart Carwin, MD;  Location: [facility] ENDOSCOPY;  Service: Endoscopy;  Laterality: N/A;   LEFT HEART CATHETERIZATION WITH CORONARY ANGIOGRAM N/A 07/26/2011   Procedure: LEFT HEART CATHETERIZATION WITH CORONARY ANGIOGRAM;  Surgeon: Laurey Morale, MD;  Location: [facility] CATH LAB;  Service: Cardiovascular;  Laterality: N/A;   RIGHT HEART CATHETERIZATION N/A 07/29/2011   Procedure: RIGHT HEART CATH;  Surgeon: Rollene Rotunda, MD;  Location: [facility] CATH LAB;  Service: Cardiovascular;  Laterality: N/A;   TONSILLECTOMY     TOTAL HIP ARTHROPLASTY Left 11/21/2014   Procedure: TOTAL HIP ARTHROPLASTY ANTERIOR APPROACH;  Surgeon: Kathryne Hitch, MD;  Location: [facility] OR;  Service: Orthopedics;  Laterality: Left;   Family History  Problem Relation Age of Onset   Heart disease Father    Multiple sclerosis Father    Hypertension Mother    Hyperthyroidism Mother    Diabetes Mother    Heart attack Paternal Grandfather    Heart disease Paternal Grandfather    Diabetes Cousin  Multiple maternal cousins with type 2 diabetes mellitus   Diabetes Maternal Uncle        Type 1 diabetes mellitus   Healthy Daughter    Asthma Son    Diabetes Son    Social History   Socioeconomic History   Marital status: Widowed    Spouse name: Bernette Redbird; Son Annette Stable   Number of children: 2   Years of education: 18   Highest education level: Bachelor's degree (e.g., BA, AB, BS)  Occupational History   Occupation: Retired  Tobacco Use   Smoking status: Never   Smokeless tobacco: Never  Vaping Use   Vaping status: Never Used  Substance and Sexual Activity   Alcohol use: No   Drug use: No   Sexual activity: Not on file  Other Topics Concern   Not on file  Social History Narrative   Lives alone, suffers from depression since losing her husband. Son, Annette Stable takes her to appointments and helps her a lot.   Social Determinants of Health   Financial Resource Strain: Low Risk  (04/07/2023)   Overall Financial Resource Strain (CARDIA)    Difficulty of Paying Living Expenses: Not hard at all  Food Insecurity: No Food Insecurity (04/07/2023)   Hunger Vital Sign    Worried About Running Out of Food in the Last Year: Never true    Ran Out of Food in the Last Year:  Never true  Transportation Needs: No Transportation Needs (04/07/2023)   PRAPARE - Administrator, Civil Service (Medical): No    Lack of Transportation (Non-Medical): No  Physical Activity: Insufficiently Active (04/07/2023)   Exercise Vital Sign    Days of Exercise per Week: 3 days    Minutes of Exercise per Session: 20 min  Stress: No Stress Concern Present (04/07/2023)   [employer] of Occupational Health - Occupational Stress Questionnaire    Feeling of Stress : Not at all  Social Connections: Moderately Isolated (04/07/2023)   Social Connection and Isolation Panel [NHANES]    Frequency of Communication with Friends and Family: More than three times a week    Frequency of Social Gatherings with Friends and Family: More than three times a week    Attends Religious Services: More than 4 times per year    Active Member of [employer] or Organizations: No    Attends Banker Meetings: Never    Marital Status: Widowed    Tobacco Counseling Counseling given: Not Answered   Clinical Intake:  Pre-visit preparation completed: Yes  Pain : No/denies pain     Nutritional Risks: None Diabetes: Yes CBG done?: No Did pt. bring in CBG monitor from home?: No  How often do you need to have someone help you when you read instructions, pamphlets, or other written materials from your doctor or pharmacy?: 1 - Never  Interpreter Needed?: No  Information entered by :: Renie Ora, LPN   Activities of Daily Living    04/07/2023   12:13 PM  In your present state of health, do you have any difficulty performing the following activities:  Hearing? 0  Vision? 0  Difficulty concentrating or making decisions? 0  Walking or climbing stairs? 0  Dressing or bathing? 0  Doing errands, shopping? 0  Preparing Food and eating ? N  Using the Toilet? N  In the past six months, have you accidently leaked urine? N  Do you have problems with loss of bowel control? N   Managing your Medications? N  Managing your Finances? N  Housekeeping or managing your Housekeeping? N    Patient Care Team: Sonny Masters, FNP as PCP - General (Family Medicine) [name], Bevelyn Buckles, MD (Cardiology) Talmage Coin, MD as Attending Physician (Internal Medicine) Randa Lynn, MD as Consulting Physician (Nephrology) Sherrie George, MD as Consulting Physician (Ophthalmology)  Indicate any recent Medical Services you may have received from other than [facility] providers in the past year (date may be approximate).     Assessment:   This is a routine wellness examination for Twanna.  Hearing/Vision screen Vision Screening - Comments:: Wears rx glasses - up to date with routine eye exams with  Dr Ashley Royalty   Dietary issues and exercise activities discussed:     Goals Addressed             This Visit's Progress    Exercise 3x per week (30 min per time)   On track    04/03/2022  AWV Goal: Exercise for General Health  Patient will verbalize understanding of the benefits of increased physical activity: Exercising regularly is important. It will improve your overall fitness, flexibility, and endurance. Regular exercise also will improve your overall health. It can help you control your weight, reduce stress, and improve your bone density. Over the next year, patient will increase physical activity as tolerated with a goal of at least 150 minutes of moderate physical activity per week.  You can tell that you are exercising at a moderate intensity if your heart starts beating faster and you start breathing faster but can still hold a conversation. Moderate-intensity exercise ideas include: Walking 1 mile (1.6 km) in about 15 minutes Biking Hiking Golfing Dancing Water aerobics Patient will verbalize understanding of everyday activities that increase physical activity by providing examples like the following: Yard work, such as: Garment/textile technologist Gardening Washing windows or floors Patient will be able to explain general safety guidelines for exercising:  Before you start a new exercise program, talk with your health care provider. Do not exercise so much that you hurt yourself, feel dizzy, or get very short of breath. Wear comfortable clothes and wear shoes with good support. Drink plenty of water while you exercise to prevent dehydration or heat stroke. Work out until your breathing and your heartbeat get faster.        Depression Screen    04/07/2023   12:11 PM 09/13/2022    8:56 AM 06/07/2022    9:11 AM 04/03/2022   11:55 AM 10/12/2021   10:03 AM 03/05/2021    2:46 PM 02/16/2021    8:10 AM  PHQ 2/9 Scores  PHQ - 2 Score 0 3 4 2 1 1 1   PHQ- 9 Score  7 10 2 1 4 5     Fall Risk    04/07/2023   12:10 PM 06/07/2022    9:11 AM 04/03/2022   11:53 AM 10/12/2021   10:03 AM 02/16/2021    8:28 AM  Fall Risk   Falls in the past year? 0 0 0 1 0  Number falls in past yr: 0  0 0 0  Injury with Fall? 0  0 0 0  Risk for fall due to : No Fall Risks  Impaired balance/gait;Orthopedic patient  No Fall Risks  Follow up Falls prevention discussed  Education provided;Falls prevention discussed Falls prevention discussed Falls prevention discussed    MEDICARE RISK AT HOME:  Medicare  Risk at Home - 04/07/23 1210     Any stairs in or around the home? Yes    If so, are there any without handrails? No    Home free of loose throw rugs in walkways, pet beds, electrical cords, etc? Yes    Adequate lighting in your home to reduce risk of falls? Yes    Life alert? No    Use of a cane, walker or w/c? No    Grab bars in the bathroom? Yes    Shower chair or bench in shower? No    Elevated toilet seat or a handicapped toilet? No             TIMED UP AND GO:  Was the test performed?  No    Cognitive Function:        04/07/2023   12:13 PM 02/16/2020    8:46 AM 01/21/2019     8:53 AM 01/21/2019    8:52 AM  6CIT Screen  What Year? 0 points 0 points  0 points  What month? 0 points 0 points  0 points  What time? 0 points 0 points  0 points  Count back from 20 0 points 0 points 2 points   Months in reverse 0 points 0 points 0 points   Repeat phrase 0 points 2 points 0 points   Total Score 0 points 2 points      Immunizations Immunization History  Administered Date(s) Administered   Fluad Quad(high Dose 65+) 06/07/2022   Influenza Split 07/27/2011, 07/29/2013, 06/13/2014   Influenza, High Dose Seasonal PF 05/23/2015, 08/14/2018, 06/23/2020, 07/20/2021   Influenza,inj,Quad PF,6+ Mos 05/26/2012, 06/07/2016   Influenza-Unspecified 06/07/2015   Moderna Sars-Covid-2 Vaccination 02/02/2020, 03/03/2020   PNEUMOCOCCAL CONJUGATE-20 10/12/2021   Pneumococcal Conjugate-13 05/23/2015, 10/06/2018   Pneumococcal Polysaccharide-23 07/10/2012   Pneumococcal-Unspecified 06/07/2015   Tdap 10/06/2018   Zoster Recombinant(Shingrix) 06/07/2022, 09/13/2022    TDAP status: Up to date  Flu Vaccine status: Up to date  Pneumococcal vaccine status: Up to date  Covid-19 vaccine status: Completed vaccines  Qualifies for Shingles Vaccine? Yes   Zostavax completed Yes   Shingrix Completed?: Yes  Screening Tests Health Maintenance  Topic Date Due   DEXA SCAN  Never done   OPHTHALMOLOGY EXAM  10/14/2019   COVID-19 Vaccine (3 - Moderna risk series) 03/31/2020   MAMMOGRAM  04/20/2021   HEMOGLOBIN A1C  10/27/2022   Diabetic kidney evaluation - Urine ACR  01/19/2023   INFLUENZA VACCINE  04/10/2023   Diabetic kidney evaluation - eGFR measurement  06/08/2023   Colonoscopy  11/19/2023   Medicare Annual Wellness (AWV)  04/06/2024   DTaP/Tdap/Td (2 - Td or Tdap) 10/06/2028   Pneumonia Vaccine 34+ Years old  Completed   Hepatitis C Screening  Completed   Zoster Vaccines- Shingrix  Completed   HPV VACCINES  Aged Out   FOOT EXAM  Discontinued    Health Maintenance  Health  Maintenance Due  Topic Date Due   DEXA SCAN  Never done   OPHTHALMOLOGY EXAM  10/14/2019   COVID-19 Vaccine (3 - Moderna risk series) 03/31/2020   MAMMOGRAM  04/20/2021   HEMOGLOBIN A1C  10/27/2022   Diabetic kidney evaluation - Urine ACR  01/19/2023    Colorectal cancer screening: Type of screening: Colonoscopy. Completed 11/18/2013. Repeat every 10 years  Mammogram status: Ordered 04/07/2023. Pt provided with contact info and advised to call to schedule appt.   Bone Density status: Completed 06/07/2022. Results reflect: Bone density results: OSTEOPOROSIS. Repeat  every 2 years.  Lung Cancer Screening: (Low Dose CT Chest recommended if Age 32-80 years, 20 pack-year currently smoking OR have quit w/in 15years.) does not qualify.   Lung Cancer Screening Referral: n/a  Additional Screening:  Hepatitis C Screening: does not qualify; Completed 06/07/2022  Vision Screening: Recommended annual ophthalmology exams for early detection of glaucoma and other disorders of the eye. Is the patient up to date with their annual eye exam?  Yes  Who is the provider or what is the name of the office in which the patient attends annual eye exams? Dr Ashley Royalty  If pt is not established with a provider, would they like to be referred to a provider to establish care? No .   Dental Screening: Recommended annual dental exams for proper oral hygiene    Community Resource Referral / Chronic Care Management: CRR required this visit?  No   CCM required this visit?  No     Plan:     I have personally reviewed and noted the following in the patient's chart:   Medical and social history Use of alcohol, tobacco or illicit drugs  Current medications and supplements including opioid prescriptions. Patient is not currently taking opioid prescriptions. Functional ability and status Nutritional status Physical activity Advanced directives List of other physicians Hospitalizations, surgeries, and ER  visits in previous 12 months Vitals Screenings to include cognitive, depression, and falls Referrals and appointments  In addition, I have reviewed and discussed with patient certain preventive protocols, quality metrics, and best practice recommendations. A written personalized care plan for preventive services as well as general preventive health recommendations were provided to patient.     Lorrene Reid, LPN   1/61/0960   After Visit Summary: (MyChart) Due to this being a telephonic visit, the after visit summary with patients personalized plan was offered to patient via MyChart   Nurse Notes: none

## 2023-04-08 ENCOUNTER — Encounter (HOSPITAL_COMMUNITY): Payer: Self-pay | Admitting: Nephrology

## 2023-04-21 ENCOUNTER — Encounter: Payer: Medicare PPO | Attending: Nephrology

## 2023-04-21 ENCOUNTER — Inpatient Hospital Stay (HOSPITAL_COMMUNITY): Admission: RE | Admit: 2023-04-21 | Payer: Medicare PPO | Source: Ambulatory Visit

## 2023-04-21 VITALS — BP 139/49 | HR 65 | Temp 98.5°F | Resp 18

## 2023-04-21 DIAGNOSIS — N184 Chronic kidney disease, stage 4 (severe): Secondary | ICD-10-CM | POA: Diagnosis not present

## 2023-04-21 DIAGNOSIS — D631 Anemia in chronic kidney disease: Secondary | ICD-10-CM | POA: Diagnosis not present

## 2023-04-21 MED ORDER — EPOETIN ALFA-EPBX 10000 UNIT/ML IJ SOLN
10000.0000 [IU] | Freq: Once | INTRAMUSCULAR | Status: AC
  Administered 2023-04-21: 10000 [IU] via SUBCUTANEOUS

## 2023-04-21 MED ORDER — EPOETIN ALFA-EPBX 10000 UNIT/ML IJ SOLN: 10000.0000 [IU] | Freq: Once | INTRAMUSCULAR | Status: DC

## 2023-04-21 NOTE — Progress Notes (Signed)
Diagnosis: Anemia in Chronic Kidney Disease  Provider:  Celso Amy MD  Procedure: Injection  Retacrit (epoetin alfa-epbx), Dose: 10000 Units, Site: subcutaneous, Number of injections: 1 Hemoglobin 9.7  Post Care: Patient declined observation  Discharge: Condition: Good, Destination: Home . AVS Provided  Performed by:  Arrie Senate, RN

## 2023-04-22 ENCOUNTER — Other Ambulatory Visit: Payer: Self-pay | Admitting: Family Medicine

## 2023-04-22 DIAGNOSIS — F339 Major depressive disorder, recurrent, unspecified: Secondary | ICD-10-CM

## 2023-04-22 DIAGNOSIS — E039 Hypothyroidism, unspecified: Secondary | ICD-10-CM

## 2023-04-22 DIAGNOSIS — K219 Gastro-esophageal reflux disease without esophagitis: Secondary | ICD-10-CM

## 2023-04-22 DIAGNOSIS — E1069 Type 1 diabetes mellitus with other specified complication: Secondary | ICD-10-CM

## 2023-04-22 DIAGNOSIS — I252 Old myocardial infarction: Secondary | ICD-10-CM

## 2023-04-22 DIAGNOSIS — E1043 Type 1 diabetes mellitus with diabetic autonomic (poly)neuropathy: Secondary | ICD-10-CM

## 2023-04-22 DIAGNOSIS — I2510 Atherosclerotic heart disease of native coronary artery without angina pectoris: Secondary | ICD-10-CM

## 2023-04-23 ENCOUNTER — Other Ambulatory Visit (HOSPITAL_COMMUNITY): Payer: Self-pay | Admitting: *Deleted

## 2023-04-23 MED ORDER — ENTRESTO 24-26 MG PO TABS: 1.0000 | ORAL_TABLET | Freq: Two times a day (BID) | ORAL | 6 refills | Status: DC

## 2023-04-25 ENCOUNTER — Encounter: Payer: Self-pay | Admitting: Family Medicine

## 2023-05-05 ENCOUNTER — Encounter (INDEPENDENT_AMBULATORY_CARE_PROVIDER_SITE_OTHER): Payer: Medicare PPO | Admitting: Emergency Medicine

## 2023-05-05 ENCOUNTER — Ambulatory Visit (HOSPITAL_COMMUNITY): Payer: Medicare PPO

## 2023-05-05 VITALS — BP 121/45 | HR 65 | Temp 98.0°F | Resp 16

## 2023-05-05 DIAGNOSIS — N184 Chronic kidney disease, stage 4 (severe): Secondary | ICD-10-CM | POA: Diagnosis not present

## 2023-05-05 DIAGNOSIS — D631 Anemia in chronic kidney disease: Secondary | ICD-10-CM

## 2023-05-05 MED ORDER — CLONIDINE HCL 0.1 MG PO TABS: 0.1000 mg | ORAL_TABLET | ORAL | Status: DC | PRN

## 2023-05-05 MED ORDER — EPOETIN ALFA-EPBX 10000 UNIT/ML IJ SOLN
10000.0000 [IU] | Freq: Once | INTRAMUSCULAR | Status: AC
  Administered 2023-05-05: 10000 [IU] via SUBCUTANEOUS

## 2023-05-05 NOTE — Progress Notes (Signed)
Diagnosis: Anemia in Chronic Kidney Disease  Provider:  Celso Amy MD  Procedure: Injection  Retacrit (epoetin alfa-epbx), Dose: 10000 Units, Site: subcutaneous, Number of injections: 1  Hemoglobin 9.6   Post Care: Patient declined observation  Discharge: Condition: Good, Destination: Home . AVS Provided  Performed by:  Arrie Senate, RN

## 2023-05-14 ENCOUNTER — Encounter: Payer: Self-pay | Admitting: Nephrology

## 2023-05-14 NOTE — Addendum Note (Signed)
Addended by: Marin Shutter E on: 05/14/2023 03:01 PM   Modules accepted: Orders

## 2023-05-19 ENCOUNTER — Other Ambulatory Visit: Payer: Self-pay

## 2023-05-19 ENCOUNTER — Encounter: Payer: Medicare PPO | Attending: Nephrology | Admitting: Emergency Medicine

## 2023-05-19 VITALS — BP 144/43 | HR 67 | Temp 98.2°F | Resp 16

## 2023-05-19 DIAGNOSIS — N184 Chronic kidney disease, stage 4 (severe): Secondary | ICD-10-CM | POA: Insufficient documentation

## 2023-05-19 DIAGNOSIS — Z0000 Encounter for general adult medical examination without abnormal findings: Secondary | ICD-10-CM

## 2023-05-19 DIAGNOSIS — D631 Anemia in chronic kidney disease: Secondary | ICD-10-CM | POA: Insufficient documentation

## 2023-05-19 DIAGNOSIS — Z13820 Encounter for screening for osteoporosis: Secondary | ICD-10-CM

## 2023-05-19 MED ORDER — EPOETIN ALFA-EPBX 10000 UNIT/ML IJ SOLN: 10000.0000 [IU] | Freq: Once | INTRAMUSCULAR | Status: DC

## 2023-05-19 NOTE — Progress Notes (Signed)
Hemoglobin 10.1 no injection needed today.

## 2023-06-02 DIAGNOSIS — E1042 Type 1 diabetes mellitus with diabetic polyneuropathy: Secondary | ICD-10-CM | POA: Diagnosis not present

## 2023-06-04 ENCOUNTER — Encounter: Payer: Medicare PPO | Admitting: Internal Medicine

## 2023-06-04 VITALS — BP 134/49 | HR 62 | Temp 98.1°F | Resp 16

## 2023-06-04 DIAGNOSIS — N184 Chronic kidney disease, stage 4 (severe): Secondary | ICD-10-CM

## 2023-06-04 DIAGNOSIS — D631 Anemia in chronic kidney disease: Secondary | ICD-10-CM

## 2023-06-04 LAB — GLUCOSE HEMOCUE WAIVED: Hemoglobin: 11

## 2023-06-04 MED ORDER — EPOETIN ALFA-EPBX 10000 UNIT/ML IJ SOLN: 10000.0000 [IU] | Freq: Once | INTRAMUSCULAR | Status: DC

## 2023-06-04 NOTE — Progress Notes (Signed)
Hbg was 11.0 shot was not needed

## 2023-06-04 NOTE — Addendum Note (Signed)
Addended by: Tad Moore on: 06/04/2023 03:06 PM   Modules accepted: Orders

## 2023-06-16 ENCOUNTER — Encounter: Payer: Medicare PPO | Attending: Nephrology | Admitting: *Deleted

## 2023-06-16 VITALS — BP 121/48 | HR 61 | Temp 97.6°F | Resp 18

## 2023-06-16 DIAGNOSIS — D631 Anemia in chronic kidney disease: Secondary | ICD-10-CM | POA: Insufficient documentation

## 2023-06-16 DIAGNOSIS — N184 Chronic kidney disease, stage 4 (severe): Secondary | ICD-10-CM | POA: Diagnosis not present

## 2023-06-16 LAB — GLUCOSE HEMOCUE WAIVED: Hemoglobin: 9.5

## 2023-06-16 MED ORDER — EPOETIN ALFA-EPBX 10000 UNIT/ML IJ SOLN
10000.0000 [IU] | Freq: Once | INTRAMUSCULAR | Status: AC
  Administered 2023-06-16: 10000 [IU] via SUBCUTANEOUS

## 2023-06-16 NOTE — Progress Notes (Signed)
Diagnosis: Anemia in Chronic Kidney Disease  Provider:  Celso Amy MD  Procedure: Injection  Retacrit (epoetin alfa-epbx), Dose: 10000 Units, Site: subcutaneous, Number of injections: 1  Hgb. 9.5  Post Care: Observation period completed  Discharge: Condition: Good, Destination: Home . AVS Provided  Performed by:  Daleen Squibb, RN

## 2023-06-20 DIAGNOSIS — E1042 Type 1 diabetes mellitus with diabetic polyneuropathy: Secondary | ICD-10-CM | POA: Diagnosis not present

## 2023-06-20 DIAGNOSIS — Z794 Long term (current) use of insulin: Secondary | ICD-10-CM | POA: Diagnosis not present

## 2023-06-20 DIAGNOSIS — E1043 Type 1 diabetes mellitus with diabetic autonomic (poly)neuropathy: Secondary | ICD-10-CM | POA: Diagnosis not present

## 2023-06-20 DIAGNOSIS — E1065 Type 1 diabetes mellitus with hyperglycemia: Secondary | ICD-10-CM | POA: Diagnosis not present

## 2023-06-30 ENCOUNTER — Encounter (INDEPENDENT_AMBULATORY_CARE_PROVIDER_SITE_OTHER): Payer: Medicare PPO | Admitting: Internal Medicine

## 2023-06-30 VITALS — BP 125/54 | HR 62 | Temp 97.6°F | Resp 16

## 2023-06-30 DIAGNOSIS — D631 Anemia in chronic kidney disease: Secondary | ICD-10-CM

## 2023-06-30 DIAGNOSIS — N184 Chronic kidney disease, stage 4 (severe): Secondary | ICD-10-CM | POA: Diagnosis not present

## 2023-06-30 LAB — GLUCOSE HEMOCUE WAIVED: Hemoglobin: 9.4

## 2023-06-30 MED ORDER — EPOETIN ALFA-EPBX 10000 UNIT/ML IJ SOLN
10000.0000 [IU] | Freq: Once | INTRAMUSCULAR | Status: AC
  Administered 2023-06-30: 10000 [IU] via SUBCUTANEOUS
  Filled 2023-06-30: qty 1

## 2023-06-30 NOTE — Progress Notes (Signed)
Diagnosis: Chronic Kidney disease, Iron deficient anemia  Provider:  Celso Amy MD  Procedure: Injection  Retacrit (epoetin alfa-epbx), Dose: 10000 Units, Site: subcutaneous, Number of injections: 1  Post Care: Observation period completed  Discharge: Condition: Good, Destination: Home . AVS Provided  Performed by:  Feliberto Harts, LPN

## 2023-07-04 ENCOUNTER — Encounter: Payer: Self-pay | Admitting: Family Medicine

## 2023-07-04 ENCOUNTER — Ambulatory Visit (INDEPENDENT_AMBULATORY_CARE_PROVIDER_SITE_OTHER): Payer: Medicare PPO | Admitting: Family Medicine

## 2023-07-04 ENCOUNTER — Other Ambulatory Visit: Payer: Medicare PPO

## 2023-07-04 VITALS — BP 120/56 | HR 64 | Temp 96.9°F | Ht 64.0 in | Wt 151.4 lb

## 2023-07-04 DIAGNOSIS — E1059 Type 1 diabetes mellitus with other circulatory complications: Secondary | ICD-10-CM | POA: Diagnosis not present

## 2023-07-04 DIAGNOSIS — I152 Hypertension secondary to endocrine disorders: Secondary | ICD-10-CM

## 2023-07-04 DIAGNOSIS — E1159 Type 2 diabetes mellitus with other circulatory complications: Secondary | ICD-10-CM

## 2023-07-04 DIAGNOSIS — D649 Anemia, unspecified: Secondary | ICD-10-CM | POA: Diagnosis not present

## 2023-07-04 DIAGNOSIS — E1022 Type 1 diabetes mellitus with diabetic chronic kidney disease: Secondary | ICD-10-CM

## 2023-07-04 DIAGNOSIS — Z23 Encounter for immunization: Secondary | ICD-10-CM | POA: Diagnosis not present

## 2023-07-04 DIAGNOSIS — N189 Chronic kidney disease, unspecified: Secondary | ICD-10-CM | POA: Diagnosis not present

## 2023-07-04 DIAGNOSIS — I5042 Chronic combined systolic (congestive) and diastolic (congestive) heart failure: Secondary | ICD-10-CM | POA: Diagnosis not present

## 2023-07-04 DIAGNOSIS — E785 Hyperlipidemia, unspecified: Secondary | ICD-10-CM | POA: Diagnosis not present

## 2023-07-04 DIAGNOSIS — E1069 Type 1 diabetes mellitus with other specified complication: Secondary | ICD-10-CM | POA: Diagnosis not present

## 2023-07-04 DIAGNOSIS — E1065 Type 1 diabetes mellitus with hyperglycemia: Secondary | ICD-10-CM

## 2023-07-04 DIAGNOSIS — N184 Chronic kidney disease, stage 4 (severe): Secondary | ICD-10-CM

## 2023-07-04 DIAGNOSIS — E039 Hypothyroidism, unspecified: Secondary | ICD-10-CM | POA: Diagnosis not present

## 2023-07-04 DIAGNOSIS — E1129 Type 2 diabetes mellitus with other diabetic kidney complication: Secondary | ICD-10-CM | POA: Diagnosis not present

## 2023-07-04 DIAGNOSIS — Z0000 Encounter for general adult medical examination without abnormal findings: Secondary | ICD-10-CM | POA: Diagnosis not present

## 2023-07-04 DIAGNOSIS — D729 Disorder of white blood cells, unspecified: Secondary | ICD-10-CM

## 2023-07-04 DIAGNOSIS — E11319 Type 2 diabetes mellitus with unspecified diabetic retinopathy without macular edema: Secondary | ICD-10-CM | POA: Diagnosis not present

## 2023-07-04 DIAGNOSIS — Z0001 Encounter for general adult medical examination with abnormal findings: Secondary | ICD-10-CM

## 2023-07-04 NOTE — Progress Notes (Signed)
Subjective:  Patient ID: Bridget Martinez, female    DOB: 1951/04/21, 72 y.o.   MRN: [id number]  Patient Care Team: Dolores Patty, MD (Cardiology) Talmage Coin, MD as Attending Physician (Internal Medicine) Randa Lynn, MD as Consulting Physician (Nephrology) Sherrie George, MD as Consulting Physician (Ophthalmology)   Chief Complaint:  Annual Exam   HPI: Bridget Martinez is a 72 y.o. female presenting on 07/04/2023 for Annual Exam   Discussed the use of AI scribe software for clinical note transcription with the patient, who gave verbal consent to proceed.  History of Present Illness   The patient, with a history of diabetes, chronic kidney disease, and hypertension, has been managing well with no new health issues since the last visit. The patient denies any changes in vision, hearing, bowel or bladder function, and denies any chest pain, shortness of breath, or leg swelling. The patient reports no increased thirst or urination, abdominal pain, or sleep disturbances. The patient's activity level is limited to walking around at home. There is no report of blood in stools, abnormal bleeding, or bruising. The patient's blood glucose reading was 139 on the day of the visit. The patient's legs are noted to be slightly swollen, but this is reported as not being more than usual.          Relevant past medical, surgical, family, and social history reviewed and updated as indicated.  Allergies and medications reviewed and updated. Data reviewed: Chart in Epic.   Past Medical History:  Diagnosis Date   Arthritis    CHF (congestive heart failure) (HCC)    Coronary artery disease    angina.  MI.    Depression    anxiety   Diabetes mellitus    on insulin, with h/o DKA    GERD (gastroesophageal reflux disease)    Hiatal hernia.    HTN (hypertension)    Hyperlipidemia    Hypothyroidism    Left displaced femoral neck fracture (HCC) 11/19/2014   Myocardial infarction  [facility])    NSTEMI 07/2011 with cardiogenic shock, s/p CABG 09/2011   Osteoporosis    Pneumonia        Tuberculosis    Venous stasis ulcers (HCC)     Past Surgical History:  Procedure Laterality Date   CARDIAC CATHETERIZATION     COLONOSCOPY N/A 11/18/2013   Procedure: COLONOSCOPY;  Surgeon: Hart Carwin, MD;  Location: [facility] ENDOSCOPY;  Service: Endoscopy;  Laterality: N/A;   CORONARY ARTERY BYPASS GRAFT     Dr. Zenaida Niece Trigt in 09/2011    CORONARY ARTERY BYPASS GRAFT  2012   ESOPHAGOGASTRODUODENOSCOPY N/A 11/17/2013   Procedure: ESOPHAGOGASTRODUODENOSCOPY (EGD);  Surgeon: Hart Carwin, MD;  Location: [facility] ENDOSCOPY;  Service: Endoscopy;  Laterality: N/A;   LEFT HEART CATHETERIZATION WITH CORONARY ANGIOGRAM N/A 07/26/2011   Procedure: LEFT HEART CATHETERIZATION WITH CORONARY ANGIOGRAM;  Surgeon: Laurey Morale, MD;  Location: [facility] CATH LAB;  Service: Cardiovascular;  Laterality: N/A;   RIGHT HEART CATHETERIZATION N/A 07/29/2011   Procedure: RIGHT HEART CATH;  Surgeon: Rollene Rotunda, MD;  Location: [facility] CATH LAB;  Service: Cardiovascular;  Laterality: N/A;   TONSILLECTOMY     TOTAL HIP ARTHROPLASTY Left 11/21/2014   Procedure: TOTAL HIP ARTHROPLASTY ANTERIOR APPROACH;  Surgeon: Kathryne Hitch, MD;  Location: [facility] OR;  Service: Orthopedics;  Laterality: Left;    Social History   Socioeconomic History   Marital status: Widowed    Spouse name: Bernette Redbird; Son Annette Stable  Number of children: 2   Years of education: 18   Highest education level: Bachelor's degree (e.g., BA, AB, BS)  Occupational History   Occupation: Retired  Tobacco Use   Smoking status: Never   Smokeless tobacco: Never  Vaping Use   Vaping status: Never Used  Substance and Sexual Activity   Alcohol use: No   Drug use: No   Sexual activity: Not on file  Other Topics Concern   Not on file  Social History Narrative   Lives alone, suffers from depression since losing her husband. Son, Annette Stable takes her to appointments and helps  her a lot.   Social Determinants of Health   Financial Resource Strain: Low Risk  (04/07/2023)   Overall Financial Resource Strain (CARDIA)    Difficulty of Paying Living Expenses: Not hard at all  Food Insecurity: No Food Insecurity (04/07/2023)   Hunger Vital Sign    Worried About Running Out of Food in the Last Year: Never true    Ran Out of Food in the Last Year: Never true  Transportation Needs: No Transportation Needs (04/07/2023)   PRAPARE - Administrator, Civil Service (Medical): No    Lack of Transportation (Non-Medical): No  Physical Activity: Insufficiently Active (04/07/2023)   Exercise Vital Sign    Days of Exercise per Week: 3 days    Minutes of Exercise per Session: 20 min  Stress: No Stress Concern Present (04/07/2023)   [employer] of Occupational Health - Occupational Stress Questionnaire    Feeling of Stress : Not at all  Social Connections: Moderately Isolated (04/07/2023)   Social Connection and Isolation Panel [NHANES]    Frequency of Communication with Friends and Family: More than three times a week    Frequency of Social Gatherings with Friends and Family: More than three times a week    Attends Religious Services: More than 4 times per year    Active Member of [employer] or Organizations: No    Attends Banker Meetings: Never    Marital Status: Widowed  Intimate Partner Violence: Not At Risk (04/07/2023)   Humiliation, Afraid, Rape, and Kick questionnaire    Fear of Current or Ex-Partner: No    Emotionally Abused: No    Physically Abused: No    Sexually Abused: No    Outpatient Encounter Medications as of 07/04/2023  Medication Sig   aspirin 81 MG EC tablet Take 1 tablet (81 mg total) by mouth daily.   buPROPion (WELLBUTRIN XL) 150 MG 24 hr tablet TAKE 1 TABLET BY MOUTH TWICE A DAY   carvedilol (COREG) 12.5 MG tablet TAKE 1 TABLET (12.5MG  TOTAL) BY MOUTH TWICE A DAY WITH MEALS   Continuous Blood Gluc Receiver (DEXCOM G6 RECEIVER)  DEVI See admin instructions.   Continuous Blood Gluc Sensor (DEXCOM G6 SENSOR) MISC See admin instructions.   ENTRESTO 24-26 MG Take 1 tablet by mouth 2 (two) times daily.   epoetin alfa (EPOGEN) 3000 UNIT/ML injection Inject into the skin.   insulin aspart (NOVOLOG FLEXPEN) 100 UNIT/ML FlexPen 13 to 17 units at breakfast, 7 to 11 units at lunch and at supper   insulin glargine (LANTUS) 100 UNIT/ML injection Inject 100 Units into the skin. 18 units AM & 10 units PM   levothyroxine (SYNTHROID) 88 MCG tablet TAKE 1 TABLET BY MOUTH EVERY DAY   metoCLOPramide (REGLAN) 5 MG tablet TAKE 1 TABLET BY MOUTH 3 TIMES DAILY BEFORE MEALS   mirtazapine (REMERON) 15 MG tablet TAKE 1 TABLET  BY MOUTH EVERYDAY AT BEDTIME   pantoprazole (PROTONIX) 40 MG tablet TAKE 1 TABLET BY MOUTH EVERY DAY   potassium chloride (KLOR-CON) 20 MEQ packet Take by mouth 2 (two) times daily.   rosuvastatin (CRESTOR) 40 MG tablet TAKE 1 TABLET BY MOUTH EVERY DAY   sodium bicarbonate 650 MG tablet Take 650 mg by mouth 3 (three) times daily.   No facility-administered encounter medications on file as of 07/04/2023.    Allergies  Allergen Reactions   Penicillins Anaphylaxis and Hives    Tolerates zosyn Has patient had a PCN reaction causing immediate rash, facial/tongue/throat swelling, SOB or lightheadedness with hypotension: Yes Has patient had a PCN reaction causing severe rash involving mucus membranes or skin necrosis: No Has patient had a PCN reaction that required hospitalization: Yes Has patient had a PCN reaction occurring within the last 10 years: No If all of the above answers are "NO", then may proceed with Cephalosporin use.    Sulfa Antibiotics Anaphylaxis, Other (See Comments) and Swelling    Stiff neck also   Ciprofloxacin Other (See Comments)   Macrobid [Nitrofurantoin Monohyd Macro] Other (See Comments)    Skin peels off   Other Other (See Comments)   Ciprofloxacin Hcl Other (See Comments)    Skin peeling     Pertinent ROS per HPI, otherwise unremarkable      Objective:  BP (!) 120/56   Pulse 64   Temp (!) 96.9 F (36.1 C) (Temporal)   Ht 5\' 4"  (1.626 m)   Wt 151 lb 6.4 oz (68.7 kg)   SpO2 97%   BMI 25.99 kg/m    Wt Readings from Last 3 Encounters:  07/04/23 151 lb 6.4 oz (68.7 kg)  04/07/23 157 lb (71.2 kg)  09/13/22 172 lb 12.8 oz (78.4 kg)    Physical Exam Vitals and nursing note reviewed.  Constitutional:      Appearance: She is well-developed, well-groomed and overweight. She is ill-appearing (chronically ill).  HENT:     Head: Normocephalic and atraumatic.     Right Ear: Tympanic membrane, ear canal and external ear normal.     Left Ear: Tympanic membrane, ear canal and external ear normal.     Nose: Nose normal.     Mouth/Throat:     Mouth: Mucous membranes are moist.  Eyes:     Conjunctiva/sclera: Conjunctivae normal.     Pupils: Pupils are equal, round, and reactive to light.  Cardiovascular:     Rate and Rhythm: Normal rate and regular rhythm.     Heart sounds: Normal heart sounds.  Pulmonary:     Effort: Pulmonary effort is normal.     Breath sounds: Normal breath sounds.  Abdominal:     General: Bowel sounds are normal.     Palpations: Abdomen is soft.  Musculoskeletal:     Cervical back: Normal range of motion and neck supple.     Right lower leg: Edema present.     Left lower leg: Edema present.  Skin:    General: Skin is warm and dry.     Capillary Refill: Capillary refill takes less than 2 seconds.  Neurological:     General: No focal deficit present.     Mental Status: She is alert. She is disoriented.  Psychiatric:        Mood and Affect: Mood normal.        Behavior: Behavior normal.        Thought Content: Thought content normal.  Judgment: Judgment normal.    Physical Exam   HEENT: Oropharynx without erythema or exudates, upper dentures present, lower dentures absent. CHEST: Breath sounds clear. EXTREMITIES: Mild bilateral  lower extremity edema. SKIN: Dry.        Results for orders placed or performed in visit on 06/30/23  Glucose Hemocue Waived  Result Value Ref Range   Hemoglobin 9.4        Pertinent labs & imaging results that were available during my care of the patient were reviewed by me and considered in my medical decision making.  Assessment & Plan:  Shavonte was seen today for annual exam.  Diagnoses and all orders for this visit:  Annual physical exam -     CBC with Differential/Platelet -     Lipid panel -     Thyroid Panel With TSH -     VITAMIN D 25 Hydroxy (Vit-D Deficiency, Fractures) -     Hepatic function panel  Hyperlipidemia due to type 1 diabetes mellitus (HCC) -     Lipid panel -     Hepatic function panel  Type 1 diabetes mellitus with hyperglycemia (HCC) -     CBC with Differential/Platelet -     Lipid panel -     Hepatic function panel  CKD (chronic kidney disease) stage 4, GFR 15-29 ml/min (HCC) -     CBC with Differential/Platelet -     VITAMIN D 25 Hydroxy (Vit-D Deficiency, Fractures)  Hypertension associated with diabetes (HCC) -     CBC with Differential/Platelet -     Thyroid Panel With TSH -     Hepatic function panel  Acquired hypothyroidism -     Thyroid Panel With TSH  Encounter for immunization -     Flu Vaccine Trivalent High Dose (Fluad)     Assessment and Plan    Diabetes Mellitus Recent adjustment of insulin due to elevated afternoon blood sugars. No new symptoms of hyperglycemia or hypoglycemia reported. A1c to be checked at next endocrinology appointment in December. -Continue current insulin regimen as directed by endocrinologist.  General Health Maintenance Upcoming appointments with nephrology and endocrinology. Eye exam to be scheduled by endocrinologist's office. Flu shot to be administered today. -Administer influenza vaccine today. -Check labs ordered by nephrologist and order additional tests as needed.  Dental  Health Patient has top dentures, does not wear bottom dentures. No new oral complaints. -No changes to current dental care plan.  Skin Health Dry skin noted on legs. -Recommend use of CeraVe or Cetaphil for skin hydration.          Continue all other maintenance medications.  Follow up plan: Return in about 4 months (around 11/04/2023), or if symptoms worsen or fail to improve, for Chronic follow up.   Continue healthy lifestyle choices, including diet (rich in fruits, vegetables, and lean proteins, and low in salt and simple carbohydrates) and exercise (at least 30 minutes of moderate physical activity daily).  Educational handout given for health maintenance   The above assessment and management plan was discussed with the patient. The patient verbalized understanding of and has agreed to the management plan. Patient is aware to call the clinic if they develop any new symptoms or if symptoms persist or worsen. Patient is aware when to return to the clinic for a follow-up visit. Patient educated on when it is appropriate to go to the emergency department.   Kari Baars, FNP-C [facility] [phone]

## 2023-07-05 LAB — CBC WITH DIFFERENTIAL/PLATELET
Basos: 1 %
EOS (ABSOLUTE): 0 10*3/uL (ref 0.0–0.2)
Eos: 1 %
Hematocrit: 29.3 % — ABNORMAL LOW (ref 34.0–46.6)
Hemoglobin: 9.2 g/dL — ABNORMAL LOW (ref 11.1–15.9)
Immature Granulocytes: 0 10*3/uL (ref 0.0–0.1)
Immature Granulocytes: 1 %
Lymphocytes Absolute: 1.4 10*3/uL (ref 0.7–3.1)
Lymphs: 16 %
MCH: 29 pg (ref 26.6–33.0)
MCHC: 31.4 g/dL — ABNORMAL LOW (ref 31.5–35.7)
MCV: 92 fL (ref 79–97)
Monocytes Absolute: 0.1 10*3/uL (ref 0.0–0.4)
Monocytes Absolute: 0.7 10*3/uL (ref 0.1–0.9)
Monocytes: 8 %
Neutrophils Absolute: 6.4 10*3/uL (ref 1.4–7.0)
Neutrophils: 73 %
Platelets: 275 10*3/uL (ref 150–450)
RBC: 3.17 x10E6/uL — ABNORMAL LOW (ref 3.77–5.28)
RDW: 16.2 % — ABNORMAL HIGH (ref 11.7–15.4)
WBC: 8.7 10*3/uL (ref 3.4–10.8)

## 2023-07-05 LAB — LIPID PANEL
Chol/HDL Ratio: 1.8 ratio (ref 0.0–4.4)
Cholesterol, Total: 118 mg/dL (ref 100–199)
HDL: 64 mg/dL (ref >39)
LDL Chol Calc (NIH): 35 mg/dL (ref 0–99)
Triglycerides: 102 mg/dL (ref 0–149)
VLDL Cholesterol Cal: 19 mg/dL (ref 5–40)

## 2023-07-05 LAB — HEPATIC FUNCTION PANEL
ALT: 10 [IU]/L (ref 0–32)
AST: 10 [IU]/L (ref 0–40)
Albumin: 4.1 g/dL (ref 3.8–4.8)
Alkaline Phosphatase: 127 IU/L — ABNORMAL HIGH (ref 44–121)
Bilirubin Total: 0.4 mg/dL (ref 0.0–1.2)
Bilirubin, Direct: 0.16 mg/dL (ref 0.00–0.40)
Total Protein: 6.3 g/dL (ref 6.0–8.5)

## 2023-07-05 LAB — THYROID PANEL WITH TSH
Free Thyroxine Index: 3 (ref 1.2–4.9)
T3 Uptake Ratio: 33 % (ref 24–39)
T4, Total: 9.2 ug/dL (ref 4.5–12.0)
TSH: 0.165 u[IU]/mL — ABNORMAL LOW (ref 0.450–4.500)

## 2023-07-05 LAB — VITAMIN D 25 HYDROXY (VIT D DEFICIENCY, FRACTURES): Vit D, 25-Hydroxy: 23.2 ng/mL — ABNORMAL LOW (ref 30.0–100.0)

## 2023-07-08 ENCOUNTER — Other Ambulatory Visit: Payer: Self-pay | Admitting: Family Medicine

## 2023-07-08 DIAGNOSIS — D729 Disorder of white blood cells, unspecified: Secondary | ICD-10-CM | POA: Diagnosis not present

## 2023-07-08 NOTE — Addendum Note (Signed)
Addended by: Lorelee Cover C on: 07/08/2023 10:59 AM   Modules accepted: Orders

## 2023-07-09 LAB — ANEMIA PROFILE B
Basophils Absolute: 0 10*3/uL (ref 0.0–0.2)
Basos: 1 %
EOS (ABSOLUTE): 0.2 10*3/uL (ref 0.0–0.4)
Eos: 2 %
Ferritin: 706 ng/mL — ABNORMAL HIGH (ref 15–150)
Folate: 6.2 ng/mL (ref >3.0)
Hematocrit: 29.8 % — ABNORMAL LOW (ref 34.0–46.6)
Hemoglobin: 9.4 g/dL — ABNORMAL LOW (ref 11.1–15.9)
Immature Grans (Abs): 0 10*3/uL (ref 0.0–0.1)
Immature Granulocytes: 0 %
Iron Saturation: 21 % (ref 15–55)
Iron: 50 ug/dL (ref 27–139)
Lymphocytes Absolute: 1.7 10*3/uL (ref 0.7–3.1)
Lymphs: 22 %
MCH: 30.1 pg (ref 26.6–33.0)
MCHC: 31.5 g/dL (ref 31.5–35.7)
MCV: 96 fL (ref 79–97)
Monocytes Absolute: 0.8 10*3/uL (ref 0.1–0.9)
Monocytes: 10 %
Neutrophils Absolute: 4.9 10*3/uL (ref 1.4–7.0)
Neutrophils: 65 %
Platelets: 314 10*3/uL (ref 150–450)
RBC: 3.12 x10E6/uL — ABNORMAL LOW (ref 3.77–5.28)
RDW: 16.8 % — ABNORMAL HIGH (ref 11.7–15.4)
Retic Ct Pct: 1.6 % (ref 0.6–2.6)
Total Iron Binding Capacity: 240 ug/dL — ABNORMAL LOW (ref 250–450)
UIBC: 190 ug/dL (ref 118–369)
Vitamin B-12: 866 pg/mL (ref 232–1245)
WBC: 7.5 10*3/uL (ref 3.4–10.8)

## 2023-07-09 LAB — SPECIMEN STATUS REPORT

## 2023-07-11 ENCOUNTER — Other Ambulatory Visit: Payer: Medicare PPO

## 2023-07-11 DIAGNOSIS — N184 Chronic kidney disease, stage 4 (severe): Secondary | ICD-10-CM | POA: Diagnosis not present

## 2023-07-11 DIAGNOSIS — R809 Proteinuria, unspecified: Secondary | ICD-10-CM | POA: Diagnosis not present

## 2023-07-11 DIAGNOSIS — E11319 Type 2 diabetes mellitus with unspecified diabetic retinopathy without macular edema: Secondary | ICD-10-CM | POA: Diagnosis not present

## 2023-07-14 ENCOUNTER — Encounter: Payer: Medicare PPO | Attending: Nephrology | Admitting: Emergency Medicine

## 2023-07-14 VITALS — BP 115/58 | HR 62 | Temp 97.5°F | Resp 18

## 2023-07-14 DIAGNOSIS — N184 Chronic kidney disease, stage 4 (severe): Secondary | ICD-10-CM | POA: Diagnosis not present

## 2023-07-14 DIAGNOSIS — D631 Anemia in chronic kidney disease: Secondary | ICD-10-CM | POA: Insufficient documentation

## 2023-07-14 MED ORDER — EPOETIN ALFA-EPBX 10000 UNIT/ML IJ SOLN
10000.0000 [IU] | Freq: Once | INTRAMUSCULAR | Status: AC
  Administered 2023-07-14: 10000 [IU] via SUBCUTANEOUS

## 2023-07-14 NOTE — Progress Notes (Signed)
Diagnosis: Anemia in Chronic Kidney Disease  Provider:  Celso Amy MD  Procedure: Injection  Retacrit (epoetin alfa-epbx), Dose: 10000 Units, Site: subcutaneous, Number of injections: 1  Post Care: Patient declined observation  Discharge: Condition: Good, Destination: Home . AVS Provided  Performed by:  Arrie Senate, RN

## 2023-07-18 ENCOUNTER — Other Ambulatory Visit: Payer: Medicare PPO

## 2023-07-19 ENCOUNTER — Other Ambulatory Visit: Payer: Self-pay | Admitting: Family Medicine

## 2023-07-19 DIAGNOSIS — E039 Hypothyroidism, unspecified: Secondary | ICD-10-CM

## 2023-07-28 ENCOUNTER — Ambulatory Visit: Payer: Medicare PPO

## 2023-07-28 ENCOUNTER — Encounter: Payer: Medicare PPO | Admitting: Internal Medicine

## 2023-07-28 VITALS — BP 121/43 | HR 66 | Temp 98.1°F | Resp 16

## 2023-07-28 DIAGNOSIS — D631 Anemia in chronic kidney disease: Secondary | ICD-10-CM | POA: Diagnosis not present

## 2023-07-28 DIAGNOSIS — N184 Chronic kidney disease, stage 4 (severe): Secondary | ICD-10-CM

## 2023-07-28 MED ORDER — EPOETIN ALFA-EPBX 10000 UNIT/ML IJ SOLN
10000.0000 [IU] | Freq: Once | INTRAMUSCULAR | Status: AC
  Administered 2023-07-28: 10000 [IU] via SUBCUTANEOUS
  Filled 2023-07-28: qty 1

## 2023-07-28 NOTE — Progress Notes (Signed)
Diagnosis: Iron Deficiency Anemia  Provider:  Celso Amy MD  Procedure: Injection  Retacrit (epoetin alfa-epbx), Dose: 10000 Units, Site: subcutaneous, Number of injections: 1  Post Care: Observation period completed  Discharge: Condition: Good, Destination: Home . AVS Provided  Performed by:  Cleotilde Neer, LPN

## 2023-08-06 ENCOUNTER — Other Ambulatory Visit: Payer: Self-pay

## 2023-08-11 ENCOUNTER — Ambulatory Visit: Payer: Medicare PPO

## 2023-08-11 ENCOUNTER — Inpatient Hospital Stay: Payer: Medicare PPO | Attending: Oncology | Admitting: Oncology

## 2023-08-11 ENCOUNTER — Inpatient Hospital Stay: Payer: Medicare PPO

## 2023-08-11 VITALS — BP 119/45 | HR 69 | Temp 98.0°F | Resp 16 | Ht 64.0 in | Wt 150.4 lb

## 2023-08-11 DIAGNOSIS — N189 Chronic kidney disease, unspecified: Secondary | ICD-10-CM | POA: Insufficient documentation

## 2023-08-11 DIAGNOSIS — E611 Iron deficiency: Secondary | ICD-10-CM | POA: Diagnosis present

## 2023-08-11 DIAGNOSIS — N184 Chronic kidney disease, stage 4 (severe): Secondary | ICD-10-CM | POA: Diagnosis not present

## 2023-08-11 DIAGNOSIS — D631 Anemia in chronic kidney disease: Secondary | ICD-10-CM | POA: Diagnosis present

## 2023-08-11 DIAGNOSIS — Z1211 Encounter for screening for malignant neoplasm of colon: Secondary | ICD-10-CM | POA: Insufficient documentation

## 2023-08-11 MED ORDER — FERROUS SULFATE 325 (65 FE) MG PO TBEC: 325.0000 mg | DELAYED_RELEASE_TABLET | ORAL | 3 refills | Status: DC

## 2023-08-11 NOTE — Progress Notes (Signed)
[facility] HEMATOLOGY NEW VISIT  [name], Doralee Albino, FNP  REASON FOR REFERRAL: Anemia of chronic kidney disease   HISTORY OF PRESENT ILLNESS: Bridget Martinez 72 y.o. female referred for anemia of chronic kidney disease.  She is accompanied by her daughter-in-law today. She reports fatigue but denies experiencing shortness of breath or dizziness. The patient has been receiving erythropoietin shots every two weeks to manage her anemia, which she reports has been well-tolerated. She has not been taking iron pills and denies any blood in her stools. She reports no pain and is able to take her own shower.  Patient is a non-smoker, nonalcoholic.  The patient lives alone but has family members who visit daily to assist with meals and other needs.   I have reviewed the past medical history, past surgical history, social history and family history with the patient   ALLERGIES:  is allergic to penicillins, sulfa antibiotics, ciprofloxacin, macrobid [nitrofurantoin monohyd macro], other, and ciprofloxacin hcl.  MEDICATIONS:  Current Outpatient Medications  Medication Sig Dispense Refill   aspirin 81 MG EC tablet Take 1 tablet (81 mg total) by mouth daily. 30 tablet 3   buPROPion (WELLBUTRIN XL) 150 MG 24 hr tablet TAKE 1 TABLET BY MOUTH TWICE A DAY 180 tablet 0   carvedilol (COREG) 12.5 MG tablet TAKE 1 TABLET (12.5MG  TOTAL) BY MOUTH TWICE A DAY WITH MEALS 180 tablet 3   Continuous Blood Gluc Receiver (DEXCOM G6 RECEIVER) DEVI See admin instructions.     Continuous Blood Gluc Sensor (DEXCOM G6 SENSOR) MISC See admin instructions.     ENTRESTO 24-26 MG Take 1 tablet by mouth 2 (two) times daily. 60 tablet 6   epoetin alfa (EPOGEN) 3000 UNIT/ML injection Inject into the skin.     ferrous sulfate 325 (65 FE) MG EC tablet Take 1 tablet (325 mg total) by mouth every other day. 45 tablet 3   insulin aspart (NOVOLOG FLEXPEN) 100 UNIT/ML FlexPen 13 to 17 units at  breakfast, 7 to 11 units at lunch and at supper     insulin glargine (LANTUS) 100 UNIT/ML injection Inject 100 Units into the skin. 18 units AM & 10 units PM     levothyroxine (SYNTHROID) 88 MCG tablet TAKE 1 TABLET BY MOUTH EVERY DAY 90 tablet 0   metoCLOPramide (REGLAN) 5 MG tablet TAKE 1 TABLET BY MOUTH 3 TIMES DAILY BEFORE MEALS 270 tablet 0   mirtazapine (REMERON) 15 MG tablet TAKE 1 TABLET BY MOUTH EVERYDAY AT BEDTIME 90 tablet 0   pantoprazole (PROTONIX) 40 MG tablet TAKE 1 TABLET BY MOUTH EVERY DAY 90 tablet 0   potassium chloride (KLOR-CON) 20 MEQ packet Take by mouth 2 (two) times daily.     rosuvastatin (CRESTOR) 40 MG tablet TAKE 1 TABLET BY MOUTH EVERY DAY 90 tablet 0   sodium bicarbonate 650 MG tablet Take 650 mg by mouth 3 (three) times daily.     No current facility-administered medications for this visit.     REVIEW OF SYSTEMS:   Constitutional: Denies fevers, chills or night sweats Eyes: Denies blurriness of vision Ears, nose, mouth, throat, and face: Denies mucositis or sore throat Respiratory: Denies cough, dyspnea or wheezes Cardiovascular: Denies palpitation, chest discomfort or lower extremity swelling Gastrointestinal:  Denies nausea, heartburn or change in bowel habits Skin: Denies abnormal skin rashes Lymphatics: Denies new lymphadenopathy or easy bruising Neurological:Denies numbness, tingling or new weaknesses Behavioral/Psych: Mood is stable, no new changes  All other systems  were reviewed with the patient and are negative.  PHYSICAL EXAMINATION:   Vitals:   08/11/23 1504  BP: (!) 119/45  Pulse: 69  Resp: 16  Temp: 98 F (36.7 C)  SpO2: 100%    GENERAL:alert, no distress and comfortable SKIN: skin color, texture, turgor are normal, no rashes or significant lesions LUNGS: clear to auscultation and percussion with normal breathing effort HEART: regular rate & rhythm and no murmurs and no lower extremity edema ABDOMEN:abdomen soft, non-tender and  normal bowel sounds Musculoskeletal:no cyanosis of digits and no clubbing. B/L LE pedal edema. NEURO: alert & oriented x 3 with fluent speech  LABORATORY DATA:  I have reviewed the data as listed and from records from his nephrologist Labs from 07/04/2023 Ferritin: 743, TIBC: 220, iron: 115, percent sat: 54 Vitamin B12: 717, folate: 6.7 CBC: WBC: 8.9, hemoglobin: 9.3, hematocrit: 29.1, MCV: 93, platelets: 288 CMP: Creatinine: 2.38, eGFR: 21, phosphorus: 2.4, rest: WNL  As per documentation : M spike: Negative  Lab Results  Component Value Date   WBC 7.5 07/08/2023   NEUTROABS 4.9 07/08/2023   HGB 9.4 (L) 07/08/2023   HCT 29.8 (L) 07/08/2023   MCV 96 07/08/2023   PLT 314 07/08/2023      Component Value Date/Time   NA 142 06/07/2022 0942   K 4.6 06/07/2022 0942   CL 107 (H) 06/07/2022 0942   CO2 21 06/07/2022 0942   GLUCOSE 125 (H) 06/07/2022 0942   GLUCOSE 299 (H) 03/27/2018 2120   BUN 40 (H) 06/07/2022 0942   CREATININE 1.98 (H) 06/07/2022 0942   CALCIUM 8.9 06/07/2022 0942   PROT 6.3 07/04/2023 1423   ALBUMIN 4.1 07/04/2023 1423   AST 10 07/04/2023 1423   ALT 10 07/04/2023 1423   ALKPHOS 127 (H) 07/04/2023 1423   BILITOT 0.4 07/04/2023 1423   GFRNONAA 44 (L) 08/14/2018 0905   GFRAA 51 (L) 08/14/2018 0905       Chemistry      Component Value Date/Time   NA 142 06/07/2022 0942   K 4.6 06/07/2022 0942   CL 107 (H) 06/07/2022 0942   CO2 21 06/07/2022 0942   BUN 40 (H) 06/07/2022 0942   CREATININE 1.98 (H) 06/07/2022 0942   GLU 416 01/18/2022 0000      Component Value Date/Time   CALCIUM 8.9 06/07/2022 0942   ALKPHOS 127 (H) 07/04/2023 1423   AST 10 07/04/2023 1423   ALT 10 07/04/2023 1423   BILITOT 0.4 07/04/2023 1423     ASSESSMENT & PLAN:  Patient is a 72 year old female with anemia of chronic kidney disease referred for transition of care for EPO shots   Anemia in chronic kidney disease Hemoglobin 9.3, no evidence of iron deficiency. Currently  receiving erythropoietin shots every two weeks, which is the appropriate treatment. Mild iron deficiency noted, but IV iron not recommended due to elevated ferritin. -Continue erythropoietin shots every two weeks. -Start oral iron supplementation every other day, with monitoring for constipation. -Check labs in three months to assess response to treatment.  Colon cancer screening Last colonoscopy in 2015, patient declined repeat colonoscopy and stool-based testing. -No further action at this time, patient declined screening.   Orders Placed This Encounter  Procedures   CBC with Differential/Platelet    Standing Status:   Future    Standing Expiration Date:   08/10/2024   Comprehensive metabolic panel    Standing Status:   Future    Standing Expiration Date:   08/10/2024   Ferritin  Standing Status:   Future    Standing Expiration Date:   08/10/2024   Iron and TIBC    Standing Status:   Future    Standing Expiration Date:   08/10/2024    Order Specific Question:   Release to patient    Answer:   Immediate [1]    The total time spent in the appointment was 30 minutes encounter with patients including review of chart and various tests results, discussions about plan of care and coordination of care plan   All questions were answered. The patient knows to call the clinic with any problems, questions or concerns. No barriers to learning was detected.   Cindie Crumbly, MD 12/2/20245:18 PM

## 2023-08-11 NOTE — Assessment & Plan Note (Signed)
Last colonoscopy in 2015, patient declined repeat colonoscopy and stool-based testing. -No further action at this time, patient declined screening.

## 2023-08-11 NOTE — Patient Instructions (Signed)
VISIT SUMMARY:  During today's visit, we discussed your ongoing management of anemia related to chronic kidney disease, reviewed your colon cancer screening history, and noted some lower extremity swelling. We have made some adjustments to your treatment plan to better address your health needs.  YOUR PLAN:  -ANEMIA OF CHRONIC KIDNEY DISEASE: Anemia of chronic kidney disease is a condition where the kidneys do not produce enough of the hormone needed to make red blood cells, leading to low hemoglobin levels. You will continue receiving erythropoietin shots every two weeks, and we are starting you on oral iron supplements every other day. We will monitor your response to this treatment and check your labs in three months.  -COLON CANCER SCREENING: Colon cancer screening helps detect early signs of colon cancer. Your last colonoscopy was in 2015, and you have declined further screening at this time. No further action is needed unless you decide to proceed with screening in the future.   INSTRUCTIONS:  Please continue with your erythropoietin shots every two weeks and start taking oral iron supplements every other day. Monitor for any constipation. We will check your labs in three months to see how you are responding to the treatment. Follow up with [place] in three months, and make sure to get your labs done before the visit.

## 2023-08-11 NOTE — Assessment & Plan Note (Signed)
Hemoglobin 9.3, no evidence of iron deficiency. Currently receiving erythropoietin shots every two weeks, which is the appropriate treatment. Mild iron deficiency noted, but IV iron not recommended due to elevated ferritin. -Continue erythropoietin shots every two weeks. -Start oral iron supplementation every other day, with monitoring for constipation. -Check labs in three months to assess response to treatment.

## 2023-08-14 ENCOUNTER — Encounter: Payer: Medicare PPO | Admitting: Oncology

## 2023-08-14 ENCOUNTER — Inpatient Hospital Stay: Payer: Medicare PPO

## 2023-08-19 ENCOUNTER — Telehealth: Payer: Self-pay

## 2023-08-19 NOTE — Patient Outreach (Signed)
 Successful call to patient on today regarding preventative mammogram screening. Patient declined at this time and will follow up with PCP at later date.  Baruch Gouty [place]/VBCI  [facility] Assistant-Population Health [phone]

## 2023-08-22 ENCOUNTER — Encounter: Payer: Self-pay | Admitting: *Deleted

## 2023-08-25 ENCOUNTER — Ambulatory Visit: Payer: Medicare PPO

## 2023-08-25 ENCOUNTER — Inpatient Hospital Stay: Payer: Medicare PPO

## 2023-08-25 VITALS — BP 128/61 | HR 63 | Temp 97.0°F

## 2023-08-25 DIAGNOSIS — D631 Anemia in chronic kidney disease: Secondary | ICD-10-CM | POA: Diagnosis not present

## 2023-08-25 DIAGNOSIS — N189 Chronic kidney disease, unspecified: Secondary | ICD-10-CM | POA: Diagnosis not present

## 2023-08-25 DIAGNOSIS — E611 Iron deficiency: Secondary | ICD-10-CM | POA: Diagnosis not present

## 2023-08-25 DIAGNOSIS — N184 Chronic kidney disease, stage 4 (severe): Secondary | ICD-10-CM

## 2023-08-25 LAB — CBC WITH DIFFERENTIAL/PLATELET
Abs Immature Granulocytes: 0.02 10*3/uL (ref 0.00–0.07)
Basophils Absolute: 0 10*3/uL (ref 0.0–0.1)
Basophils Relative: 1 %
Eosinophils Absolute: 0.1 10*3/uL (ref 0.0–0.5)
Eosinophils Relative: 2 %
HCT: 27.6 % — ABNORMAL LOW (ref 36.0–46.0)
Hemoglobin: 8.9 g/dL — ABNORMAL LOW (ref 12.0–15.0)
Immature Granulocytes: 0 %
Lymphocytes Relative: 19 %
Lymphs Abs: 1.3 10*3/uL (ref 0.7–4.0)
MCH: 30.7 pg (ref 26.0–34.0)
MCHC: 32.2 g/dL (ref 30.0–36.0)
MCV: 95.2 fL (ref 80.0–100.0)
Monocytes Absolute: 0.7 10*3/uL (ref 0.1–1.0)
Monocytes Relative: 11 %
Neutro Abs: 4.4 10*3/uL (ref 1.7–7.7)
Neutrophils Relative %: 67 %
Platelets: 210 10*3/uL (ref 150–400)
RBC: 2.9 MIL/uL — ABNORMAL LOW (ref 3.87–5.11)
RDW: 17.2 % — ABNORMAL HIGH (ref 11.5–15.5)
WBC: 6.5 10*3/uL (ref 4.0–10.5)
nRBC: 0 % (ref 0.0–0.2)

## 2023-08-25 LAB — COMPREHENSIVE METABOLIC PANEL
ALT: 15 U/L (ref 0–44)
AST: 15 U/L (ref 15–41)
Albumin: 3.5 g/dL (ref 3.5–5.0)
Alkaline Phosphatase: 108 U/L (ref 38–126)
Anion gap: 10 (ref 5–15)
BUN: 27 mg/dL — ABNORMAL HIGH (ref 8–23)
CO2: 23 mmol/L (ref 22–32)
Calcium: 9.7 mg/dL (ref 8.9–10.3)
Chloride: 106 mmol/L (ref 98–111)
Creatinine, Ser: 2.48 mg/dL — ABNORMAL HIGH (ref 0.44–1.00)
GFR, Estimated: 20 mL/min — ABNORMAL LOW (ref >60)
Glucose, Bld: 103 mg/dL — ABNORMAL HIGH (ref 70–99)
Potassium: 4.7 mmol/L (ref 3.5–5.1)
Sodium: 139 mmol/L (ref 135–145)
Total Bilirubin: 0.6 mg/dL (ref <1.2)
Total Protein: 6.8 g/dL (ref 6.5–8.1)

## 2023-08-25 LAB — IRON AND TIBC
Iron: 86 ug/dL (ref 28–170)
Saturation Ratios: 34 % — ABNORMAL HIGH (ref 10.4–31.8)
TIBC: 250 ug/dL (ref 250–450)
UIBC: 164 ug/dL

## 2023-08-25 LAB — FERRITIN: Ferritin: 632 ng/mL — ABNORMAL HIGH (ref 11–307)

## 2023-08-25 MED ORDER — EPOETIN ALFA-EPBX 10000 UNIT/ML IJ SOLN
10000.0000 [IU] | Freq: Once | INTRAMUSCULAR | Status: AC
  Administered 2023-08-25: 10000 [IU] via SUBCUTANEOUS
  Filled 2023-08-25: qty 1

## 2023-08-25 NOTE — Progress Notes (Signed)
Hemoglobin 8.9 and blood pressure stable. Patient tolerated Retacrit injection with no complaints voiced.  Site clean and dry with no bruising or swelling noted at site.  See MAR for details.  Band aid applied.  Patient stable during and after injection.  Vss with discharge and left in satisfactory condition with no s/s of distress noted. All follow ups as scheduled.   Bridget Martinez [employer]

## 2023-08-31 DIAGNOSIS — E1042 Type 1 diabetes mellitus with diabetic polyneuropathy: Secondary | ICD-10-CM | POA: Diagnosis not present

## 2023-09-05 DIAGNOSIS — Z794 Long term (current) use of insulin: Secondary | ICD-10-CM | POA: Diagnosis not present

## 2023-09-05 DIAGNOSIS — E1043 Type 1 diabetes mellitus with diabetic autonomic (poly)neuropathy: Secondary | ICD-10-CM | POA: Diagnosis not present

## 2023-09-05 DIAGNOSIS — E1065 Type 1 diabetes mellitus with hyperglycemia: Secondary | ICD-10-CM | POA: Diagnosis not present

## 2023-09-05 DIAGNOSIS — E039 Hypothyroidism, unspecified: Secondary | ICD-10-CM | POA: Diagnosis not present

## 2023-09-05 DIAGNOSIS — E1042 Type 1 diabetes mellitus with diabetic polyneuropathy: Secondary | ICD-10-CM | POA: Diagnosis not present

## 2023-09-07 ENCOUNTER — Other Ambulatory Visit: Payer: Self-pay | Admitting: Family Medicine

## 2023-09-07 ENCOUNTER — Other Ambulatory Visit (HOSPITAL_COMMUNITY): Payer: Self-pay | Admitting: Internal Medicine

## 2023-09-07 DIAGNOSIS — E1069 Type 1 diabetes mellitus with other specified complication: Secondary | ICD-10-CM

## 2023-09-07 DIAGNOSIS — I252 Old myocardial infarction: Secondary | ICD-10-CM

## 2023-09-07 DIAGNOSIS — F339 Major depressive disorder, recurrent, unspecified: Secondary | ICD-10-CM

## 2023-09-07 DIAGNOSIS — I2510 Atherosclerotic heart disease of native coronary artery without angina pectoris: Secondary | ICD-10-CM

## 2023-09-08 ENCOUNTER — Other Ambulatory Visit: Payer: Self-pay

## 2023-09-08 ENCOUNTER — Ambulatory Visit: Payer: Medicare PPO

## 2023-09-08 DIAGNOSIS — N184 Chronic kidney disease, stage 4 (severe): Secondary | ICD-10-CM

## 2023-09-09 ENCOUNTER — Inpatient Hospital Stay: Payer: Medicare PPO

## 2023-09-09 VITALS — BP 113/47 | HR 63 | Temp 97.8°F | Resp 18

## 2023-09-09 DIAGNOSIS — D631 Anemia in chronic kidney disease: Secondary | ICD-10-CM | POA: Diagnosis not present

## 2023-09-09 DIAGNOSIS — E611 Iron deficiency: Secondary | ICD-10-CM | POA: Diagnosis not present

## 2023-09-09 DIAGNOSIS — N184 Chronic kidney disease, stage 4 (severe): Secondary | ICD-10-CM

## 2023-09-09 DIAGNOSIS — N189 Chronic kidney disease, unspecified: Secondary | ICD-10-CM | POA: Diagnosis not present

## 2023-09-09 LAB — CBC WITH DIFFERENTIAL/PLATELET
Abs Immature Granulocytes: 0.03 10*3/uL (ref 0.00–0.07)
Basophils Absolute: 0 10*3/uL (ref 0.0–0.1)
Basophils Relative: 1 %
Eosinophils Absolute: 0.1 10*3/uL (ref 0.0–0.5)
Eosinophils Relative: 2 %
HCT: 27.1 % — ABNORMAL LOW (ref 36.0–46.0)
Hemoglobin: 8.6 g/dL — ABNORMAL LOW (ref 12.0–15.0)
Immature Granulocytes: 0 %
Lymphocytes Relative: 19 %
Lymphs Abs: 1.6 10*3/uL (ref 0.7–4.0)
MCH: 30.6 pg (ref 26.0–34.0)
MCHC: 31.7 g/dL (ref 30.0–36.0)
MCV: 96.4 fL (ref 80.0–100.0)
Monocytes Absolute: 0.7 10*3/uL (ref 0.1–1.0)
Monocytes Relative: 9 %
Neutro Abs: 5.8 10*3/uL (ref 1.7–7.7)
Neutrophils Relative %: 69 %
Platelets: 227 10*3/uL (ref 150–400)
RBC: 2.81 MIL/uL — ABNORMAL LOW (ref 3.87–5.11)
RDW: 17.5 % — ABNORMAL HIGH (ref 11.5–15.5)
WBC: 8.4 10*3/uL (ref 4.0–10.5)
nRBC: 0 % (ref 0.0–0.2)

## 2023-09-09 MED ORDER — EPOETIN ALFA-EPBX 10000 UNIT/ML IJ SOLN
10000.0000 [IU] | Freq: Once | INTRAMUSCULAR | Status: AC
  Administered 2023-09-09: 10000 [IU] via SUBCUTANEOUS
  Filled 2023-09-09: qty 1

## 2023-09-09 NOTE — Patient Instructions (Signed)

## 2023-09-09 NOTE — Progress Notes (Signed)
Patient presents today for Retacrit injection. Hemoglobin reviewed prior to administration. VSS tolerated without incident or complaint. See MAR for details. Patient stable during and after injection. Patient discharged in satisfactory condition with no s/s of distress noted.  

## 2023-09-19 ENCOUNTER — Encounter (HOSPITAL_COMMUNITY): Payer: Self-pay | Admitting: Internal Medicine

## 2023-09-19 ENCOUNTER — Ambulatory Visit (HOSPITAL_COMMUNITY)
Admission: RE | Admit: 2023-09-19 | Discharge: 2023-09-19 | Disposition: A | Discharge status: Home / Self Care | Payer: Medicare PPO | Source: Ambulatory Visit | Attending: Internal Medicine | Admitting: Internal Medicine

## 2023-09-19 VITALS — BP 128/58 | HR 66 | Wt 141.8 lb

## 2023-09-19 DIAGNOSIS — E1022 Type 1 diabetes mellitus with diabetic chronic kidney disease: Secondary | ICD-10-CM | POA: Insufficient documentation

## 2023-09-19 DIAGNOSIS — I252 Old myocardial infarction: Secondary | ICD-10-CM | POA: Insufficient documentation

## 2023-09-19 DIAGNOSIS — Z79899 Other long term (current) drug therapy: Secondary | ICD-10-CM | POA: Insufficient documentation

## 2023-09-19 DIAGNOSIS — Z951 Presence of aortocoronary bypass graft: Secondary | ICD-10-CM | POA: Diagnosis not present

## 2023-09-19 DIAGNOSIS — Z794 Long term (current) use of insulin: Secondary | ICD-10-CM | POA: Diagnosis not present

## 2023-09-19 DIAGNOSIS — E039 Hypothyroidism, unspecified: Secondary | ICD-10-CM | POA: Insufficient documentation

## 2023-09-19 DIAGNOSIS — I255 Ischemic cardiomyopathy: Secondary | ICD-10-CM | POA: Insufficient documentation

## 2023-09-19 DIAGNOSIS — F32A Depression, unspecified: Secondary | ICD-10-CM | POA: Insufficient documentation

## 2023-09-19 DIAGNOSIS — N184 Chronic kidney disease, stage 4 (severe): Secondary | ICD-10-CM | POA: Insufficient documentation

## 2023-09-19 DIAGNOSIS — I10 Essential (primary) hypertension: Secondary | ICD-10-CM

## 2023-09-19 DIAGNOSIS — I5042 Chronic combined systolic (congestive) and diastolic (congestive) heart failure: Secondary | ICD-10-CM | POA: Insufficient documentation

## 2023-09-19 DIAGNOSIS — I130 Hypertensive heart and chronic kidney disease with heart failure and stage 1 through stage 4 chronic kidney disease, or unspecified chronic kidney disease: Secondary | ICD-10-CM | POA: Insufficient documentation

## 2023-09-19 DIAGNOSIS — I447 Left bundle-branch block, unspecified: Secondary | ICD-10-CM | POA: Insufficient documentation

## 2023-09-19 DIAGNOSIS — I2510 Atherosclerotic heart disease of native coronary artery without angina pectoris: Secondary | ICD-10-CM | POA: Diagnosis not present

## 2023-09-19 DIAGNOSIS — E785 Hyperlipidemia, unspecified: Secondary | ICD-10-CM | POA: Diagnosis not present

## 2023-09-19 DIAGNOSIS — I5022 Chronic systolic (congestive) heart failure: Secondary | ICD-10-CM

## 2023-09-19 NOTE — Addendum Note (Signed)
 Encounter addended by: Noralee Space, RN on: 09/19/2023 12:20 PM  Actions taken: Order list changed, Diagnosis association updated

## 2023-09-19 NOTE — Progress Notes (Signed)
 [facility] VISIT  Patient ID: Bridget Martinez, female   DOB: 01/06/1951, 73 y.o.   MRN: [id number] PCP: Bridget [name], FNP Nephrologist: Dr. Meliton Endocrinologist: Dr Bridget Martinez  HPI: Bridget Martinez is a 73 yo woman with DM1, HTN, hypothyroidism, CAD s/p CABG x 4 (09/2011), depression and CHF due to ICM with EF 30-35%  Admitted 12/14 for DKA  She has had multiple admissions for gastroparesis and hyperglycemia.  In 2/16, she was re-admitted with DKA, n/v, gastroparesis, and NSTEMI. Evaluated by Dr Bridget Martinez and not felt to be cath candidate.  She as noted to have intermittent LBBB.    Echo 06/11/21 EF 30-35% inferior AK mod MR G2 DD Personally reviewed  Here for routine f/u with her daughter. We have not seen her since 10/22. Overall doing ok. Can do ADLs without too much problem. No CP or SOB. No edema Struggling with her DM control (working with Dr. Faythe) Has lost 16 pounds over the past 2 years. Poor appetite   Echo 10/20 EF 30-35% (stable) mild-mod MR  Echo 05/26/18: EF 30-35% inferior AK mod MR grade II DD Personally reviewed Echo 01/2016 EF 30-35% inferior AK    Review of systems complete and found to be negative unless listed in HPI.   Past Medical History:  Diagnosis Date   Arthritis    CHF (congestive heart failure) (HCC)    Coronary artery disease    angina.  MI.    Depression    anxiety   Diabetes mellitus    on insulin , with h/o DKA    GERD (gastroesophageal reflux disease)    Hiatal hernia.    HTN (hypertension)    Hyperlipidemia    Hypothyroidism    Left displaced femoral neck fracture (HCC) 11/19/2014   Myocardial infarction [facility])    NSTEMI 07/2011 with cardiogenic shock, s/p CABG 09/2011   Osteoporosis    Pneumonia        Tuberculosis    Venous stasis ulcers (HCC)     Current Outpatient Medications  Medication Sig Dispense Refill   aspirin  81 MG EC tablet Take 1 tablet (81 mg total) by mouth daily. 30 tablet 3   buPROPion  (WELLBUTRIN  XL) 150 MG  24 hr tablet TAKE 1 TABLET BY MOUTH TWICE A DAY 180 tablet 0   carvedilol  (COREG ) 12.5 MG tablet TAKE 1 TABLET (12.5MG  TOTAL) BY MOUTH TWICE A DAY WITH MEALS 60 tablet 0   Continuous Blood Gluc Receiver (DEXCOM G6 RECEIVER) DEVI See admin instructions.     Continuous Blood Gluc Sensor (DEXCOM G6 SENSOR) MISC See admin instructions.     ENTRESTO  24-26 MG Take 1 tablet by mouth 2 (two) times daily. 60 tablet 6   epoetin  alfa (EPOGEN ) 3000 UNIT/ML injection Inject into the skin.     ferrous sulfate  325 (65 FE) MG EC tablet Take 1 tablet (325 mg total) by mouth every other day. 45 tablet 3   insulin  aspart (NOVOLOG  FLEXPEN) 100 UNIT/ML FlexPen 13 to 17 units at breakfast, 7 to 11 units at lunch and at supper     insulin  glargine (LANTUS ) 100 UNIT/ML injection Inject 100 Units into the skin. 18 units AM & 10 units PM     levothyroxine  (SYNTHROID ) 75 MCG tablet 1 tablet on an empty stomach every morning Orally Once a day for 30 days     metoCLOPramide  (REGLAN ) 5 MG tablet TAKE 1 TABLET BY MOUTH 3 TIMES DAILY BEFORE MEALS 270 tablet 0   mirtazapine  (REMERON ) 15  MG tablet TAKE 1 TABLET BY MOUTH EVERYDAY AT BEDTIME 90 tablet 0   pantoprazole  (PROTONIX ) 40 MG tablet TAKE 1 TABLET BY MOUTH EVERY DAY 90 tablet 0   potassium chloride  (KLOR-CON ) 20 MEQ packet Take by mouth 2 (two) times daily.     rosuvastatin  (CRESTOR ) 40 MG tablet TAKE 1 TABLET BY MOUTH EVERY DAY 90 tablet 0   sodium bicarbonate  650 MG tablet Take 650 mg by mouth 3 (three) times daily.     No current facility-administered medications for this encounter.    Vitals:   09/19/23 1122  BP: (!) 128/58  Pulse: 66  SpO2: 99%  Weight: 64.3 kg (141 lb 12.8 oz)     Wt Readings from Last 3 Encounters:  09/19/23 64.3 kg (141 lb 12.8 oz)  08/11/23 68.2 kg (150 lb 5.7 oz)  07/04/23 68.7 kg (151 lb 6.4 oz)    PHYSICAL EXAM:  General:  Elderly frail No resp difficulty HEENT: normal Neck: supple. no JVD. Carotids 2+ bilat; no bruits. No  lymphadenopathy or thryomegaly appreciated. Cor: PMI nondisplaced. Regular rate & rhythm. No rubs, gallops or murmurs. Lungs: clear Abdomen: soft, nontender, nondistended. No hepatosplenomegaly. No bruits or masses. Good bowel sounds. Extremities: no cyanosis, clubbing, rash, edema Neuro: alert & orientedx3, cranial nerves grossly intact. moves all 4 extremities w/o difficulty. Affect pleasant  ECG: Sinus with PACs IVCD Personally reviewed    ASSESSMENT & PLAN:   1) Chronic systolic HF: Ischemic cardiomyopathy.  - Echo 01/2016 EF 35% (stable)RV normal.   - Echo  06/11/21 EF stable at 30-35% Mod MR  - Stable NYHA II-III  - Volume status looks good. Hasn't needed lasix  - Continue Coreg  12.5 mg BID. Recently increased by Nephrology - Continue Entresto  24/26 mg BID (dose reduced due to CKD).   - Off spiro due to hyperkalemia  - Not candidate for SGLT2i due to DM1 - Has not been interested in ICD - RTC in 9 months with echo  2) HTN:  - Blood pressure well controlled. Continue current regimen. 3) HLD:  - Continue rosuvastatin . Followed by [facility]. Goal LDL < 70 4) CAD: s/p CABG x 4 in 1/13.  - No s/s ischemnia - Myoview  4/18. No ischemia - Continue ASA 81 and statin   5) DM, type I: - She is followed by Dr Bridget Martinez. No change.  - With DM1 does not qualify for SGLT21 6) CKD IV:  - Followed by Dr. 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

## 2023-09-19 NOTE — Patient Instructions (Signed)
 Great to see you today!!!  Your physician recommends that you schedule a follow-up appointment in: 9 months (October), **PLEASE CALL OUR OFFICE IN AUGUST TO SCHEDULE THIS APPOINTMENT  If you have any questions or concerns before your next appointment please send us  a message through [place] or call our office at [phone].    TO LEAVE A MESSAGE FOR THE NURSE SELECT OPTION 2, PLEASE LEAVE A MESSAGE INCLUDING: YOUR NAME DATE OF BIRTH CALL BACK NUMBER REASON FOR CALL**this is important as we prioritize the call backs  YOU WILL RECEIVE A CALL BACK THE SAME DAY AS LONG AS YOU CALL BEFORE 4:00 PM  At the [facility], you and your health needs are our priority. As part of our continuing mission to provide you with exceptional heart care, we have created designated Provider Care Teams. These Care Teams include your primary Cardiologist (physician) and Advanced Practice Providers (APPs- Physician Assistants and Nurse Practitioners) who all work together to provide you with the care you need, when you need it.   You may see any of the following providers on your designated Care Team at your next follow up: Dr Toribio Fuel Dr Ezra Shuck Dr. Ria Commander Dr. Morene Brownie Amy Lenetta, NP Caffie Shed, [place] [facility] [place], [place] Beckey Coe, NP Jordan Lee, NP Tinnie Redman, PharmD   Please be sure to bring in all your medications bottles to every appointment.    Thank you for choosing [facility]

## 2023-09-19 NOTE — Addendum Note (Signed)
 Encounter addended by: Noralee Space, RN on: 09/19/2023 12:12 PM  Actions taken: Clinical Note Signed

## 2023-09-22 ENCOUNTER — Other Ambulatory Visit: Payer: Self-pay

## 2023-09-22 DIAGNOSIS — N184 Chronic kidney disease, stage 4 (severe): Secondary | ICD-10-CM

## 2023-09-23 ENCOUNTER — Inpatient Hospital Stay: Payer: Medicare PPO | Attending: Hematology

## 2023-09-23 ENCOUNTER — Inpatient Hospital Stay: Payer: Medicare PPO

## 2023-09-23 VITALS — BP 107/55 | HR 62 | Temp 97.1°F | Resp 18

## 2023-09-23 DIAGNOSIS — N184 Chronic kidney disease, stage 4 (severe): Secondary | ICD-10-CM

## 2023-09-23 DIAGNOSIS — D631 Anemia in chronic kidney disease: Secondary | ICD-10-CM | POA: Diagnosis not present

## 2023-09-23 LAB — CBC WITH DIFFERENTIAL/PLATELET
Abs Immature Granulocytes: 0.02 10*3/uL (ref 0.00–0.07)
Basophils Absolute: 0 10*3/uL (ref 0.0–0.1)
Basophils Relative: 1 %
Eosinophils Absolute: 0.2 10*3/uL (ref 0.0–0.5)
Eosinophils Relative: 3 %
HCT: 26.5 % — ABNORMAL LOW (ref 36.0–46.0)
Hemoglobin: 8.4 g/dL — ABNORMAL LOW (ref 12.0–15.0)
Immature Granulocytes: 0 %
Lymphocytes Relative: 19 %
Lymphs Abs: 1.1 10*3/uL (ref 0.7–4.0)
MCH: 30.8 pg (ref 26.0–34.0)
MCHC: 31.7 g/dL (ref 30.0–36.0)
MCV: 97.1 fL (ref 80.0–100.0)
Monocytes Absolute: 0.6 10*3/uL (ref 0.1–1.0)
Monocytes Relative: 10 %
Neutro Abs: 4.1 10*3/uL (ref 1.7–7.7)
Neutrophils Relative %: 67 %
Platelets: 286 10*3/uL (ref 150–400)
RBC: 2.73 MIL/uL — ABNORMAL LOW (ref 3.87–5.11)
RDW: 17.6 % — ABNORMAL HIGH (ref 11.5–15.5)
WBC: 6.1 10*3/uL (ref 4.0–10.5)
nRBC: 0 % (ref 0.0–0.2)

## 2023-09-23 MED ORDER — EPOETIN ALFA-EPBX 10000 UNIT/ML IJ SOLN
10000.0000 [IU] | Freq: Once | INTRAMUSCULAR | Status: AC
  Administered 2023-09-23: 10000 [IU] via SUBCUTANEOUS
  Filled 2023-09-23: qty 1

## 2023-09-23 NOTE — Progress Notes (Signed)
Patient presents today for Retacrit injection. Hemoglobin reviewed prior to administration. VSS tolerated without incident or complaint. See MAR for details. Patient stable during and after injection. Patient discharged in satisfactory condition with no s/s of distress noted.  

## 2023-09-23 NOTE — Patient Instructions (Signed)

## 2023-09-26 ENCOUNTER — Other Ambulatory Visit: Payer: Self-pay | Admitting: Family Medicine

## 2023-09-26 ENCOUNTER — Other Ambulatory Visit (HOSPITAL_COMMUNITY): Payer: Self-pay | Admitting: Internal Medicine

## 2023-09-26 DIAGNOSIS — K219 Gastro-esophageal reflux disease without esophagitis: Secondary | ICD-10-CM

## 2023-09-26 DIAGNOSIS — E1069 Type 1 diabetes mellitus with other specified complication: Secondary | ICD-10-CM

## 2023-09-26 DIAGNOSIS — F339 Major depressive disorder, recurrent, unspecified: Secondary | ICD-10-CM

## 2023-09-26 DIAGNOSIS — I2510 Atherosclerotic heart disease of native coronary artery without angina pectoris: Secondary | ICD-10-CM

## 2023-09-26 DIAGNOSIS — I252 Old myocardial infarction: Secondary | ICD-10-CM

## 2023-09-29 DIAGNOSIS — N189 Chronic kidney disease, unspecified: Secondary | ICD-10-CM | POA: Diagnosis not present

## 2023-09-29 DIAGNOSIS — R809 Proteinuria, unspecified: Secondary | ICD-10-CM | POA: Diagnosis not present

## 2023-09-29 DIAGNOSIS — D631 Anemia in chronic kidney disease: Secondary | ICD-10-CM | POA: Diagnosis not present

## 2023-10-03 ENCOUNTER — Encounter (HOSPITAL_COMMUNITY): Payer: Self-pay | Admitting: *Deleted

## 2023-10-03 ENCOUNTER — Emergency Department (HOSPITAL_COMMUNITY)
Admission: EM | Admit: 2023-10-03 | Discharge: 2023-10-03 | Disposition: A | Discharge status: Home / Self Care | Payer: Medicare PPO | Attending: Emergency Medicine | Admitting: Emergency Medicine

## 2023-10-03 ENCOUNTER — Other Ambulatory Visit: Payer: Self-pay

## 2023-10-03 DIAGNOSIS — Z7982 Long term (current) use of aspirin: Secondary | ICD-10-CM | POA: Insufficient documentation

## 2023-10-03 DIAGNOSIS — N184 Chronic kidney disease, stage 4 (severe): Secondary | ICD-10-CM | POA: Diagnosis not present

## 2023-10-03 DIAGNOSIS — E860 Dehydration: Secondary | ICD-10-CM | POA: Diagnosis not present

## 2023-10-03 DIAGNOSIS — D649 Anemia, unspecified: Secondary | ICD-10-CM | POA: Diagnosis not present

## 2023-10-03 DIAGNOSIS — D631 Anemia in chronic kidney disease: Secondary | ICD-10-CM | POA: Diagnosis not present

## 2023-10-03 DIAGNOSIS — E11319 Type 2 diabetes mellitus with unspecified diabetic retinopathy without macular edema: Secondary | ICD-10-CM | POA: Diagnosis not present

## 2023-10-03 DIAGNOSIS — R5383 Other fatigue: Secondary | ICD-10-CM | POA: Diagnosis present

## 2023-10-03 DIAGNOSIS — I129 Hypertensive chronic kidney disease with stage 1 through stage 4 chronic kidney disease, or unspecified chronic kidney disease: Secondary | ICD-10-CM | POA: Diagnosis not present

## 2023-10-03 DIAGNOSIS — I5042 Chronic combined systolic (congestive) and diastolic (congestive) heart failure: Secondary | ICD-10-CM | POA: Diagnosis not present

## 2023-10-03 DIAGNOSIS — N189 Chronic kidney disease, unspecified: Secondary | ICD-10-CM | POA: Diagnosis not present

## 2023-10-03 LAB — COMPREHENSIVE METABOLIC PANEL
ALT: 14 U/L (ref 0–44)
AST: 13 U/L — ABNORMAL LOW (ref 15–41)
Albumin: 3.5 g/dL (ref 3.5–5.0)
Alkaline Phosphatase: 97 U/L (ref 38–126)
Anion gap: 13 (ref 5–15)
BUN: 106 mg/dL — ABNORMAL HIGH (ref 8–23)
CO2: 17 mmol/L — ABNORMAL LOW (ref 22–32)
Calcium: 10 mg/dL (ref 8.9–10.3)
Chloride: 105 mmol/L (ref 98–111)
Creatinine, Ser: 2.8 mg/dL — ABNORMAL HIGH (ref 0.44–1.00)
GFR, Estimated: 17 mL/min — ABNORMAL LOW (ref >60)
Glucose, Bld: 147 mg/dL — ABNORMAL HIGH (ref 70–99)
Potassium: 5 mmol/L (ref 3.5–5.1)
Sodium: 135 mmol/L (ref 135–145)
Total Bilirubin: 0.6 mg/dL (ref 0.0–1.2)
Total Protein: 7.1 g/dL (ref 6.5–8.1)

## 2023-10-03 LAB — CBC WITH DIFFERENTIAL/PLATELET
Abs Immature Granulocytes: 0.04 10*3/uL (ref 0.00–0.07)
Basophils Absolute: 0 10*3/uL (ref 0.0–0.1)
Basophils Relative: 0 %
Eosinophils Absolute: 0.1 10*3/uL (ref 0.0–0.5)
Eosinophils Relative: 1 %
HCT: 25.7 % — ABNORMAL LOW (ref 36.0–46.0)
Hemoglobin: 8.5 g/dL — ABNORMAL LOW (ref 12.0–15.0)
Immature Granulocytes: 1 %
Lymphocytes Relative: 16 %
Lymphs Abs: 1.3 10*3/uL (ref 0.7–4.0)
MCH: 31.8 pg (ref 26.0–34.0)
MCHC: 33.1 g/dL (ref 30.0–36.0)
MCV: 96.3 fL (ref 80.0–100.0)
Monocytes Absolute: 0.8 10*3/uL (ref 0.1–1.0)
Monocytes Relative: 10 %
Neutro Abs: 5.9 10*3/uL (ref 1.7–7.7)
Neutrophils Relative %: 72 %
Platelets: 345 10*3/uL (ref 150–400)
RBC: 2.67 MIL/uL — ABNORMAL LOW (ref 3.87–5.11)
RDW: 16.8 % — ABNORMAL HIGH (ref 11.5–15.5)
WBC: 8.1 10*3/uL (ref 4.0–10.5)
nRBC: 0 % (ref 0.0–0.2)

## 2023-10-03 MED ORDER — SODIUM CHLORIDE 0.9 % IV BOLUS
1000.0000 mL | Freq: Once | INTRAVENOUS | Status: AC
  Administered 2023-10-03: 1000 mL via INTRAVENOUS

## 2023-10-03 NOTE — ED Provider Notes (Signed)
[place] EMERGENCY DEPARTMENT AT [facility] Provider Note   CSN: [id number] Arrival date & time: 10/03/23  1145     History  Chief Complaint  Patient presents with   abnormal labs    Bridget Martinez is a 73 y.o. female.  HPI Patient presents with her daughter who helps with the history.  Patient actually arrives from nephrology clinic.  We discussed her presentation with the nephrology team.  Patient presents with weakness, fatigue, anorexia, weight loss.  Patient has multiple medical issues most significantly CKD.  Today after she was in the clinic, and labs from the last few days were reviewed, noted to be worse she was sent here for evaluation.  Patient denies focal pain, focal weakness, no reported fall, trauma.  No report of melena, hematochezia, or obvious bleeding anywhere.    Home Medications Prior to Admission medications   Medication Sig Start Date End Date Taking? Authorizing Provider  aspirin 81 MG EC tablet Take 1 tablet (81 mg total) by mouth daily. 12/13/16   [name], Bevelyn Buckles, MD  buPROPion (WELLBUTRIN XL) 150 MG 24 hr tablet TAKE 1 TABLET BY MOUTH TWICE A DAY 09/08/23   Sonny Masters, FNP  carvedilol (COREG) 12.5 MG tablet TAKE 1 TABLET (12.5MG  TOTAL) BY MOUTH TWICE A DAY WITH MEALS 09/26/23   [name], Bevelyn Buckles, MD  Continuous Blood Gluc Receiver (DEXCOM G6 RECEIVER) DEVI See admin instructions. 06/01/20   [provider]  Continuous Blood Gluc Sensor (DEXCOM G6 SENSOR) MISC See admin instructions. 06/01/20   [provider]  ENTRESTO 24-26 MG Take 1 tablet by mouth 2 (two) times daily. 04/23/23   [name], Bevelyn Buckles, MD  epoetin alfa (EPOGEN) 3000 UNIT/ML injection Inject into the skin. 02/20/22   [provider]  ferrous sulfate 325 (65 FE) MG EC tablet Take 1 tablet (325 mg total) by mouth every other day. 08/11/23   Cindie Crumbly, MD  insulin aspart (NOVOLOG FLEXPEN) 100 UNIT/ML FlexPen 13 to 17 units at breakfast, 7 to 11  units at lunch and at supper 09/21/19   [provider]  insulin glargine (LANTUS) 100 UNIT/ML injection Inject 100 Units into the skin. 18 units AM & 10 units PM    [provider]  LANTUS SOLOSTAR 100 UNIT/ML Solostar Pen INJECT 20 UNITS EACH MORNING AND 20 UNITS EACH EVENING SUBCUTANEOUS TWICE A DAY 30 DAYS 37 09/23/23   [provider]  levothyroxine (SYNTHROID) 75 MCG tablet 1 tablet on an empty stomach every morning Orally Once a day for 30 days    [provider]  metoCLOPramide (REGLAN) 5 MG tablet TAKE 1 TABLET BY MOUTH 3 TIMES DAILY BEFORE MEALS 04/22/23   Sonny Masters, FNP  mirtazapine (REMERON) 15 MG tablet TAKE 1 TABLET BY MOUTH EVERYDAY AT BEDTIME 09/26/23   Sonny Masters, FNP  ondansetron (ZOFRAN) 4 MG tablet Take 4 mg by mouth every 8 (eight) hours as needed. 10/03/23   [provider]  pantoprazole (PROTONIX) 40 MG tablet TAKE 1 TABLET BY MOUTH EVERY DAY 09/26/23   [name], Doralee Albino, FNP  potassium chloride (KLOR-CON) 20 MEQ packet Take by mouth 2 (two) times daily.    [provider]  rosuvastatin (CRESTOR) 40 MG tablet TAKE 1 TABLET BY MOUTH EVERY DAY 09/26/23   Sonny Masters, FNP  sodium bicarbonate 650 MG tablet Take 650 mg by mouth 3 (three) times daily. 02/16/22   [provider]      Allergies  Penicillins, Sulfa antibiotics, Ciprofloxacin, Macrobid [nitrofurantoin monohyd macro], Other, and Ciprofloxacin hcl    Review of Systems   Review of Systems  Physical Exam Updated Vital Signs BP (!) 92/37 (BP Location: Right Arm)   Pulse 65   Temp 97.7 F (36.5 C) (Oral)   Resp 18   Ht 5\' 4"  (1.626 m)   Wt 61.2 kg   SpO2 100%   BMI 23.17 kg/m  Physical Exam Vitals and nursing note reviewed.  Constitutional:      General: She is not in acute distress.    Appearance: She is well-developed. She is ill-appearing.  HENT:     Head: Normocephalic and atraumatic.  Eyes:     Conjunctiva/sclera: Conjunctivae normal.   Cardiovascular:     Rate and Rhythm: Normal rate and regular rhythm.  Pulmonary:     Effort: Pulmonary effort is normal. No respiratory distress.     Breath sounds: Normal breath sounds. No stridor.  Abdominal:     General: There is no distension.  Skin:    General: Skin is warm and dry.  Neurological:     Mental Status: She is alert and oriented to person, place, and time.     Cranial Nerves: No cranial nerve deficit.  Psychiatric:        Mood and Affect: Mood normal.     ED Results / Procedures / Treatments   Labs (all labs ordered are listed, but only abnormal results are displayed) Labs Reviewed  COMPREHENSIVE METABOLIC PANEL - Abnormal; Notable for the following components:      Result Value   CO2 17 (*)    Glucose, Bld 147 (*)    BUN 106 (*)    Creatinine, Ser 2.80 (*)    AST 13 (*)    GFR, Estimated 17 (*)    All other components within normal limits  CBC WITH DIFFERENTIAL/PLATELET - Abnormal; Notable for the following components:   RBC 2.67 (*)    Hemoglobin 8.5 (*)    HCT 25.7 (*)    RDW 16.8 (*)    All other components within normal limits    EKG None  Radiology No results found.  Procedures Procedures    Medications Ordered in ED Medications  sodium chloride 0.9 % bolus 1,000 mL (has no administration in time range)  sodium chloride 0.9 % bolus 1,000 mL (has no administration in time range)    ED Course/ Medical Decision Making/ A&P                                 Medical Decision Making Chronically ill-appearing female with CKD presents with weakness anorexia weight loss.  History of CKD suggests likely etiology, additional considerations include dehydration, electrolyte abnormalities, mass.  Patient started on monitors, labs sent, fluid resuscitation started. Cardiac 65 sinus normal line pulse ox 100% room air normal   Amount and/or Complexity of Data Reviewed Independent Historian: caregiver External Data Reviewed:     Details: History  with nephrology Labs: ordered. Decision-making details documented in ED Course.  Risk Prescription drug management. Decision regarding hospitalization. Diagnosis or treatment significantly limited by social determinants of health.   2:23 PM Patient distress, comfortable.  Patient has daughter and son both at bedside.  We discussed all findings, and I reviewed the patient's lab history, the last few months.  Hemoglobin essentially unchanged, creatinine slightly worsening, not markedly so.  Patient continues to deny any obvious bleeding.  We discussed possibilities for the patient's persistent anemia, including the likelihood of chronic kidney disease, considerations of vitamin deficiency as well, versus occult GI bleed.  Patient is considering colonoscopy has been hesitant to do so thus far, family is trying to discuss with her Cologuard has an initial step.  With no evidence for acute bleed here, no distress, labs consistent with multiple prior studies, patient is receiving fluid resuscitation, will then follow-up with her hematologist as scheduled in 4 days, and with nephrology next week as well.  Family amenable to plan.        Final Clinical Impression(s) / ED Diagnoses Final diagnoses:  Dehydration  Anemia due to stage 4 chronic kidney disease (HCC)     Gerhard Munch, MD 10/03/23 1423

## 2023-10-03 NOTE — Discharge Instructions (Addendum)
Today's evaluation has demonstrated persistency of your anemia and chronic kidney disease.  There is also evidence for dehydration.  Please stay well-hydrated, and be sure to follow-up with your physicians next week.  Do not hesitate to return here for concerning changes in your condition.

## 2023-10-03 NOTE — ED Triage Notes (Signed)
Pt sent for abnormal labs and low BP.  Family member states pt has lost 20 lbs over a month.  BUN at 76, creatinine 3.06, HBG 8.8. + nausea, denies at present. + loss of appetite

## 2023-10-07 ENCOUNTER — Other Ambulatory Visit: Payer: Self-pay

## 2023-10-07 DIAGNOSIS — D631 Anemia in chronic kidney disease: Secondary | ICD-10-CM

## 2023-10-08 ENCOUNTER — Inpatient Hospital Stay: Payer: Medicare PPO | Admitting: Oncology

## 2023-10-08 ENCOUNTER — Inpatient Hospital Stay: Payer: Medicare PPO

## 2023-10-08 VITALS — BP 104/38 | HR 71 | Temp 98.3°F | Resp 18 | Wt 135.0 lb

## 2023-10-08 DIAGNOSIS — N184 Chronic kidney disease, stage 4 (severe): Secondary | ICD-10-CM

## 2023-10-08 DIAGNOSIS — D631 Anemia in chronic kidney disease: Secondary | ICD-10-CM

## 2023-10-08 DIAGNOSIS — R110 Nausea: Secondary | ICD-10-CM

## 2023-10-08 LAB — CBC WITH DIFFERENTIAL/PLATELET
Abs Immature Granulocytes: 0.03 10*3/uL (ref 0.00–0.07)
Basophils Absolute: 0 10*3/uL (ref 0.0–0.1)
Basophils Relative: 1 %
Eosinophils Absolute: 0.2 10*3/uL (ref 0.0–0.5)
Eosinophils Relative: 3 %
HCT: 25.8 % — ABNORMAL LOW (ref 36.0–46.0)
Hemoglobin: 8.6 g/dL — ABNORMAL LOW (ref 12.0–15.0)
Immature Granulocytes: 1 %
Lymphocytes Relative: 21 %
Lymphs Abs: 1.4 10*3/uL (ref 0.7–4.0)
MCH: 32 pg (ref 26.0–34.0)
MCHC: 33.3 g/dL (ref 30.0–36.0)
MCV: 95.9 fL (ref 80.0–100.0)
Monocytes Absolute: 0.6 10*3/uL (ref 0.1–1.0)
Monocytes Relative: 9 %
Neutro Abs: 4.4 10*3/uL (ref 1.7–7.7)
Neutrophils Relative %: 65 %
Platelets: 241 10*3/uL (ref 150–400)
RBC: 2.69 MIL/uL — ABNORMAL LOW (ref 3.87–5.11)
RDW: 16.3 % — ABNORMAL HIGH (ref 11.5–15.5)
WBC: 6.6 10*3/uL (ref 4.0–10.5)
nRBC: 0 % (ref 0.0–0.2)

## 2023-10-08 MED ORDER — EPOETIN ALFA-EPBX 10000 UNIT/ML IJ SOLN
10000.0000 [IU] | Freq: Once | INTRAMUSCULAR | Status: AC
  Administered 2023-10-08: 10000 [IU] via SUBCUTANEOUS
  Filled 2023-10-08: qty 1

## 2023-10-08 NOTE — Assessment & Plan Note (Signed)
Ongoing nausea and significant weight loss (20 pounds in a month). No vomiting or diarrhea. -Continue Zofran as needed for nausea. -Refer to gastroenterology for further evaluation.

## 2023-10-08 NOTE — Progress Notes (Signed)
Patient tolerated injection with no complaints voiced.  Site clean and dry with no bruising or swelling noted at site.  See MAR for details.  Band aid applied.  Patient stable during and after injection.  Vss with discharge and left in satisfactory condition with no s/s of distress noted.

## 2023-10-08 NOTE — Patient Instructions (Signed)
[facility] [place] - A DEPT OF [facility]  Discharge Instructions: Thank you for choosing [facility] to provide your oncology and hematology care.  If you have a lab appointment with the Cancer Center - please note that after April 8th, 2024, all labs will be drawn in the cancer center.  You do not have to check in or register with the main entrance as you have in the past but will complete your check-in in the cancer center.  Wear comfortable clothing and clothing appropriate for easy access to any Portacath or PICC line.   We strive to give you quality time with your provider. You may need to reschedule your appointment if you arrive late (15 or more minutes).  Arriving late affects you and other patients whose appointments are after yours.  Also, if you miss three or more appointments without notifying the office, you may be dismissed from the clinic at the provider's discretion.      For prescription refill requests, have your pharmacy contact our office and allow 72 hours for refills to be completed.      To help prevent nausea and vomiting after your treatment, we encourage you to take your nausea medication as directed.  BELOW ARE SYMPTOMS THAT SHOULD BE REPORTED IMMEDIATELY: *FEVER GREATER THAN 100.4 F (38 C) OR HIGHER *CHILLS OR SWEATING *NAUSEA AND VOMITING THAT IS NOT CONTROLLED WITH YOUR NAUSEA MEDICATION *UNUSUAL SHORTNESS OF BREATH *UNUSUAL BRUISING OR BLEEDING *URINARY PROBLEMS (pain or burning when urinating, or frequent urination) *BOWEL PROBLEMS (unusual diarrhea, constipation, pain near the anus) TENDERNESS IN MOUTH AND THROAT WITH OR WITHOUT PRESENCE OF ULCERS (sore throat, sores in mouth, or a toothache) UNUSUAL RASH, SWELLING OR PAIN  UNUSUAL VAGINAL DISCHARGE OR ITCHING   Items with * indicate a potential emergency and should be followed up as soon as possible or go to the Emergency Department if any problems should  occur.  Please show the CHEMOTHERAPY ALERT CARD or IMMUNOTHERAPY ALERT CARD at check-in to the Emergency Department and triage nurse.  Should you have questions after your visit or need to cancel or reschedule your appointment, please contact [facility]  - A DEPT OF [facility] [phone]  and follow the prompts.  Office hours are 8:00 a.m. to 4:30 p.m. Monday - Friday. Please note that voicemails left after 4:00 p.m. may not be returned until the following business day.  We are closed weekends and major holidays. You have access to a nurse at all times for urgent questions. Please call the main number to the clinic [phone] and follow the prompts.  For any non-urgent questions, you may also contact your provider using MyChart. We now offer e-Visits for anyone 71 and older to request care online for non-urgent symptoms. For details visit [email or web address].   Also download the MyChart app! Go to the app store, search "MyChart", open the app, select [place], and log in with your MyChart username and password.

## 2023-10-08 NOTE — Assessment & Plan Note (Signed)
Patient has a history of CKD being followed by Dr. Wolfgang Phoenix -Continue to follow with nephrology

## 2023-10-08 NOTE — Assessment & Plan Note (Signed)
Patient has stable hemoglobin and has been receiving EPO shots.  Previous iron labs did not show evidence of iron deficiency.  Patient does report multiple episodes of low blood pressure and difficulty walking.  Patient was previously reluctant for colonoscopy and is willing to do it now. -Continue EPO shots -Obtain iron panel with next blood draw -Will refer to GI for screening colonoscopy -Return to clinic in 1 month with labs.

## 2023-10-08 NOTE — Progress Notes (Signed)
[facility] at [facility] HEMATOLOGY FOLLOW-UP VISIT  [name], Doralee Albino, FNP  REASON FOR FOLLOW-UP: Anemia of chronic kidney disease  ASSESSMENT & PLAN:  Patient is a 73 year old female with chronic kidney disease following for anemia of chronic kidney disease.  Currently on EPO shots   Anemia in chronic kidney disease Patient has stable hemoglobin and has been receiving EPO shots.  Previous iron labs did not show evidence of iron deficiency.  Patient does report multiple episodes of low blood pressure and difficulty walking.  Patient was previously reluctant for colonoscopy and is willing to do it now. -Continue EPO shots -Obtain iron panel with next blood draw -Will refer to GI for screening colonoscopy -Return to clinic in 1 month with labs.  Nausea Ongoing nausea and significant weight loss (20 pounds in a month). No vomiting or diarrhea. -Continue Zofran as needed for nausea. -Refer to gastroenterology for further evaluation.  CKD (chronic kidney disease) stage 4, GFR 15-29 ml/min (HCC) Patient has a history of CKD being followed by Dr. Wolfgang Phoenix -Continue to follow with nephrology   Orders Placed This Encounter  Procedures   CBC with Differential/Platelet    Standing Status:   Future    Expected Date:   11/03/2023    Expiration Date:   10/07/2024   Comprehensive metabolic panel    Standing Status:   Future    Expected Date:   11/03/2023    Expiration Date:   10/07/2024   Ferritin    Standing Status:   Future    Expected Date:   11/03/2023    Expiration Date:   10/07/2024   Folate    Standing Status:   Future    Expected Date:   11/03/2023    Expiration Date:   10/07/2024   Vitamin B12    Standing Status:   Future    Expected Date:   11/03/2023    Expiration Date:   10/07/2024   Kappa/lambda light chains    Standing Status:   Future    Expected Date:   11/03/2023    Expiration Date:   10/07/2024   IgG, IgA, IgM    Standing Status:   Future     Expected Date:   11/03/2023    Expiration Date:   10/07/2024   Multiple Myeloma Panel (SPEP&IFE w/QIG)    Standing Status:   Future    Expected Date:   11/03/2023    Expiration Date:   10/07/2024   Iron and TIBC    Standing Status:   Future    Expected Date:   11/03/2023    Expiration Date:   10/07/2024    Release to patient:   Immediate [1]    The total time spent in the appointment was 30 minutes encounter with patients including review of chart and various tests results, discussions about plan of care and coordination of care plan   All questions were answered. The patient knows to call the clinic with any problems, questions or concerns. No barriers to learning was detected.  Cindie Crumbly, MD 1/29/20255:09 PM    INTERVAL HISTORY: Bridget Martinez 73 y.o. female being followed for anemia of chronic kidney disease.  She is accompanied by her daughter today.Patient reports ongoing nausea and weight loss of 20 pounds over the past month. She denies vomiting and diarrhea. She has been feeling 'sick to my stomach all the time.' She has been 'really nauseous' daily. She has been eating poorly and has had to  be encouraged to eat and drink. She has been feeling 'very pale' and not like herself.  The patient's daughter reports that blood pressure has been low. The patient's nephrologist had stopped her Entresto due to the low blood pressure. The patient's daughter is concerned about the fluctuations in blood pressure and is considering reaching out to the cardiologist.  The patient has been on Zofran for nausea and Reglan for gastroparesis. She takes Reglan twice a day, as taking it three times a day causes tremors. She is also on pantoprazole, but it is unclear if she is currently taking it.  I have reviewed the past medical history, past surgical history, social history and family history with the patient   ALLERGIES:  is allergic to penicillins, sulfa antibiotics, ciprofloxacin, macrobid  [nitrofurantoin monohyd macro], other, and ciprofloxacin hcl.  MEDICATIONS:  Current Outpatient Medications  Medication Sig Dispense Refill   aspirin 81 MG EC tablet Take 1 tablet (81 mg total) by mouth daily. 30 tablet 3   buPROPion (WELLBUTRIN XL) 150 MG 24 hr tablet TAKE 1 TABLET BY MOUTH TWICE A DAY 180 tablet 0   carvedilol (COREG) 12.5 MG tablet TAKE 1 TABLET (12.5MG  TOTAL) BY MOUTH TWICE A DAY WITH MEALS 60 tablet 10   Continuous Blood Gluc Receiver (DEXCOM G6 RECEIVER) DEVI See admin instructions.     Continuous Blood Gluc Sensor (DEXCOM G6 SENSOR) MISC See admin instructions.     epoetin alfa (EPOGEN) 3000 UNIT/ML injection Inject into the skin.     ferrous sulfate 325 (65 FE) MG EC tablet Take 1 tablet (325 mg total) by mouth every other day. 45 tablet 3   insulin aspart (NOVOLOG FLEXPEN) 100 UNIT/ML FlexPen 13 to 17 units at breakfast, 7 to 11 units at lunch and at supper     insulin glargine (LANTUS) 100 UNIT/ML injection Inject 100 Units into the skin. 18 units AM & 10 units PM     LANTUS SOLOSTAR 100 UNIT/ML Solostar Pen INJECT 20 UNITS EACH MORNING AND 20 UNITS EACH EVENING SUBCUTANEOUS TWICE A DAY 30 DAYS 37     levothyroxine (SYNTHROID) 75 MCG tablet 1 tablet on an empty stomach every morning Orally Once a day for 30 days     metoCLOPramide (REGLAN) 5 MG tablet TAKE 1 TABLET BY MOUTH 3 TIMES DAILY BEFORE MEALS 270 tablet 0   mirtazapine (REMERON) 15 MG tablet TAKE 1 TABLET BY MOUTH EVERYDAY AT BEDTIME 90 tablet 0   pantoprazole (PROTONIX) 40 MG tablet TAKE 1 TABLET BY MOUTH EVERY DAY 90 tablet 0   potassium chloride (KLOR-CON) 20 MEQ packet Take by mouth 2 (two) times daily.     rosuvastatin (CRESTOR) 40 MG tablet TAKE 1 TABLET BY MOUTH EVERY DAY 90 tablet 0   sodium bicarbonate 650 MG tablet Take 650 mg by mouth 3 (three) times daily.     ondansetron (ZOFRAN) 4 MG tablet Take 4 mg by mouth every 8 (eight) hours as needed. (Patient not taking: Reported on 10/08/2023)     No  current facility-administered medications for this visit.     REVIEW OF SYSTEMS:   Constitutional: Denies fevers, chills or night sweats Eyes: Denies blurriness of vision Ears, nose, mouth, throat, and face: Denies mucositis or sore throat Respiratory: Denies cough, dyspnea or wheezes Cardiovascular: Denies palpitation, chest discomfort or lower extremity swelling Gastrointestinal:  Denies nausea, heartburn or change in bowel habits Skin: Denies abnormal skin rashes Lymphatics: Denies new lymphadenopathy or easy bruising Neurological:Denies numbness, tingling or new weaknesses  Behavioral/Psych: Mood is stable, no new changes  All other systems were reviewed with the patient and are negative.  PHYSICAL EXAMINATION:   Vitals:   10/08/23 1352  BP: (!) 104/38  Pulse: 71  Resp: 18  Temp: 98.3 F (36.8 C)  SpO2: 100%    GENERAL:alert, no distress and comfortable SKIN: skin color, texture, turgor are normal, no rashes or significant lesions LUNGS: clear to auscultation and percussion with normal breathing effort HEART: regular rate & rhythm and no murmurs and no lower extremity edema ABDOMEN:abdomen soft, non-tender and normal bowel sounds Musculoskeletal:no cyanosis of digits and no clubbing  NEURO: alert & oriented x 3 with fluent speech  LABORATORY DATA:  I have reviewed the data as listed  Lab Results  Component Value Date   WBC 6.6 10/08/2023   NEUTROABS 4.4 10/08/2023   HGB 8.6 (L) 10/08/2023   HCT 25.8 (L) 10/08/2023   MCV 95.9 10/08/2023   PLT 241 10/08/2023      Component Value Date/Time   NA 135 10/03/2023 1224   NA 142 06/07/2022 0942   K 5.0 10/03/2023 1224   CL 105 10/03/2023 1224   CO2 17 (L) 10/03/2023 1224   GLUCOSE 147 (H) 10/03/2023 1224   BUN 106 (H) 10/03/2023 1224   BUN 40 (H) 06/07/2022 0942   CREATININE 2.80 (H) 10/03/2023 1224   CALCIUM 10.0 10/03/2023 1224   PROT 7.1 10/03/2023 1224   PROT 6.3 07/04/2023 1423   ALBUMIN 3.5 10/03/2023  1224   ALBUMIN 4.1 07/04/2023 1423   AST 13 (L) 10/03/2023 1224   ALT 14 10/03/2023 1224   ALKPHOS 97 10/03/2023 1224   BILITOT 0.6 10/03/2023 1224   BILITOT 0.4 07/04/2023 1423   GFRNONAA 17 (L) 10/03/2023 1224   GFRAA 51 (L) 08/14/2018 0905       Chemistry      Component Value Date/Time   NA 135 10/03/2023 1224   NA 142 06/07/2022 0942   K 5.0 10/03/2023 1224   CL 105 10/03/2023 1224   CO2 17 (L) 10/03/2023 1224   BUN 106 (H) 10/03/2023 1224   BUN 40 (H) 06/07/2022 0942   CREATININE 2.80 (H) 10/03/2023 1224   GLU 416 01/18/2022 0000      Component Value Date/Time   CALCIUM 10.0 10/03/2023 1224   ALKPHOS 97 10/03/2023 1224   AST 13 (L) 10/03/2023 1224   ALT 14 10/03/2023 1224   BILITOT 0.6 10/03/2023 1224   BILITOT 0.4 07/04/2023 1423

## 2023-10-08 NOTE — Patient Instructions (Signed)
VISIT SUMMARY:  During today's visit, we discussed your ongoing nausea, significant weight loss, and low blood pressure. We also reviewed your recent hospital visit and concerns about a possible internal bleed. Your current medications and their effects were evaluated, and we have made some recommendations for further management and follow-up.  YOUR PLAN:  -HYPOTENSION: Hypotension means low blood pressure, which can cause dizziness and difficulty walking. We recommend contacting your cardiologist to discuss how to manage your blood pressure effectively.  -POSSIBLE GI BLEED: A gastrointestinal (GI) bleed refers to any bleeding that starts in the gastrointestinal tract. Given your recent weight loss and low blood pressure, we are referring you to a gastroenterologist for further evaluation and possibly a colonoscopy.  -NAUSEA AND WEIGHT LOSS: You have been experiencing ongoing nausea and have lost 20 pounds in the past month. We recommend continuing Zofran as needed for nausea and will refer you to a gastroenterologist for further evaluation.  INSTRUCTIONS:  Please contact your cardiologist to discuss your blood pressure management. You will be referred to a gastroenterologist for further evaluation of your possible GI bleed, nausea, and gastroparesis. Continue taking Zofran as needed for nausea and Reglan twice daily for gastroparesis. Return for a follow-up visit in one month, and additional studies will be performed at your next blood draw.

## 2023-10-09 ENCOUNTER — Encounter (INDEPENDENT_AMBULATORY_CARE_PROVIDER_SITE_OTHER): Payer: Self-pay | Admitting: *Deleted

## 2023-10-22 ENCOUNTER — Other Ambulatory Visit: Payer: Self-pay | Admitting: *Deleted

## 2023-10-22 ENCOUNTER — Inpatient Hospital Stay: Payer: Medicare PPO | Attending: Hematology

## 2023-10-22 ENCOUNTER — Inpatient Hospital Stay: Payer: Medicare PPO

## 2023-10-22 DIAGNOSIS — N184 Chronic kidney disease, stage 4 (severe): Secondary | ICD-10-CM | POA: Insufficient documentation

## 2023-10-22 DIAGNOSIS — D631 Anemia in chronic kidney disease: Secondary | ICD-10-CM

## 2023-10-22 NOTE — Progress Notes (Signed)
Per lab unable to get enough tubes for blood draw, patient rescheduled for labs and possible injection on 10/24/2023. Patient made aware and verbalized understanding.

## 2023-10-24 ENCOUNTER — Inpatient Hospital Stay: Payer: Medicare PPO

## 2023-10-24 VITALS — BP 109/42 | HR 60 | Temp 96.3°F | Resp 18

## 2023-10-24 DIAGNOSIS — N184 Chronic kidney disease, stage 4 (severe): Secondary | ICD-10-CM

## 2023-10-24 DIAGNOSIS — D631 Anemia in chronic kidney disease: Secondary | ICD-10-CM

## 2023-10-24 LAB — CBC WITH DIFFERENTIAL/PLATELET
Abs Immature Granulocytes: 0.02 10*3/uL (ref 0.00–0.07)
Basophils Absolute: 0 10*3/uL (ref 0.0–0.1)
Basophils Relative: 1 %
Eosinophils Absolute: 0.2 10*3/uL (ref 0.0–0.5)
Eosinophils Relative: 3 %
HCT: 26.8 % — ABNORMAL LOW (ref 36.0–46.0)
Hemoglobin: 8.7 g/dL — ABNORMAL LOW (ref 12.0–15.0)
Immature Granulocytes: 0 %
Lymphocytes Relative: 27 %
Lymphs Abs: 1.7 10*3/uL (ref 0.7–4.0)
MCH: 31.8 pg (ref 26.0–34.0)
MCHC: 32.5 g/dL (ref 30.0–36.0)
MCV: 97.8 fL (ref 80.0–100.0)
Monocytes Absolute: 0.8 10*3/uL (ref 0.1–1.0)
Monocytes Relative: 12 %
Neutro Abs: 3.7 10*3/uL (ref 1.7–7.7)
Neutrophils Relative %: 57 %
Platelets: 235 10*3/uL (ref 150–400)
RBC: 2.74 MIL/uL — ABNORMAL LOW (ref 3.87–5.11)
RDW: 15.9 % — ABNORMAL HIGH (ref 11.5–15.5)
WBC: 6.4 10*3/uL (ref 4.0–10.5)
nRBC: 0 % (ref 0.0–0.2)

## 2023-10-24 LAB — IRON AND TIBC
Iron: 72 ug/dL (ref 28–170)
Saturation Ratios: 28 % (ref 10.4–31.8)
TIBC: 257 ug/dL (ref 250–450)
UIBC: 185 ug/dL

## 2023-10-24 LAB — VITAMIN B12: Vitamin B-12: 623 pg/mL (ref 180–914)

## 2023-10-24 LAB — COMPREHENSIVE METABOLIC PANEL
ALT: 18 U/L (ref 0–44)
AST: 21 U/L (ref 15–41)
Albumin: 3.6 g/dL (ref 3.5–5.0)
Alkaline Phosphatase: 87 U/L (ref 38–126)
Anion gap: 9 (ref 5–15)
BUN: 32 mg/dL — ABNORMAL HIGH (ref 8–23)
CO2: 23 mmol/L (ref 22–32)
Calcium: 9.1 mg/dL (ref 8.9–10.3)
Chloride: 104 mmol/L (ref 98–111)
Creatinine, Ser: 1.45 mg/dL — ABNORMAL HIGH (ref 0.44–1.00)
GFR, Estimated: 38 mL/min — ABNORMAL LOW (ref >60)
Glucose, Bld: 150 mg/dL — ABNORMAL HIGH (ref 70–99)
Potassium: 3.8 mmol/L (ref 3.5–5.1)
Sodium: 136 mmol/L (ref 135–145)
Total Bilirubin: 0.5 mg/dL (ref 0.0–1.2)
Total Protein: 6.6 g/dL (ref 6.5–8.1)

## 2023-10-24 LAB — FERRITIN: Ferritin: 556 ng/mL — ABNORMAL HIGH (ref 11–307)

## 2023-10-24 LAB — FOLATE: Folate: 8 ng/mL (ref >5.9)

## 2023-10-24 MED ORDER — EPOETIN ALFA-EPBX 10000 UNIT/ML IJ SOLN
10000.0000 [IU] | Freq: Once | INTRAMUSCULAR | Status: AC
  Administered 2023-10-24: 10000 [IU] via SUBCUTANEOUS
  Filled 2023-10-24: qty 1

## 2023-10-24 NOTE — Patient Instructions (Signed)
[facility] - A DEPT OF [facility]  Discharge Instructions: Thank you for choosing [facility] to provide your oncology and hematology care.  If you have a lab appointment with the Cancer Center - please note that after April 8th, 2024, all labs will be drawn in the cancer center.  You do not have to check in or register with the main entrance as you have in the past but will complete your check-in in the cancer center.  Wear comfortable clothing and clothing appropriate for easy access to any Portacath or PICC line.   We strive to give you quality time with your provider. You may need to reschedule your appointment if you arrive late (15 or more minutes).  Arriving late affects you and other patients whose appointments are after yours.  Also, if you miss three or more appointments without notifying the office, you may be dismissed from the clinic at the provider's discretion.      For prescription refill requests, have your pharmacy contact our office and allow 72 hours for refills to be completed.    Today you received the following chemotherapy and/or immunotherapy agents Retacrit      To help prevent nausea and vomiting after your treatment, we encourage you to take your nausea medication as directed.  BELOW ARE SYMPTOMS THAT SHOULD BE REPORTED IMMEDIATELY: *FEVER GREATER THAN 100.4 F (38 C) OR HIGHER *CHILLS OR SWEATING *NAUSEA AND VOMITING THAT IS NOT CONTROLLED WITH YOUR NAUSEA MEDICATION *UNUSUAL SHORTNESS OF BREATH *UNUSUAL BRUISING OR BLEEDING *URINARY PROBLEMS (pain or burning when urinating, or frequent urination) *BOWEL PROBLEMS (unusual diarrhea, constipation, pain near the anus) TENDERNESS IN MOUTH AND THROAT WITH OR WITHOUT PRESENCE OF ULCERS (sore throat, sores in mouth, or a toothache) UNUSUAL RASH, SWELLING OR PAIN  UNUSUAL VAGINAL DISCHARGE OR ITCHING   Items with * indicate a potential emergency and should be followed up  as soon as possible or go to the Emergency Department if any problems should occur.  Please show the CHEMOTHERAPY ALERT CARD or IMMUNOTHERAPY ALERT CARD at check-in to the Emergency Department and triage nurse.  Should you have questions after your visit or need to cancel or reschedule your appointment, please contact [facility] [place] - A DEPT OF [facility] [phone]  and follow the prompts.  Office hours are 8:00 a.m. to 4:30 p.m. Monday - Friday. Please note that voicemails left after 4:00 p.m. may not be returned until the following business day.  We are closed weekends and major holidays. You have access to a nurse at all times for urgent questions. Please call the main number to the clinic [phone] and follow the prompts.  For any non-urgent questions, you may also contact your provider using MyChart. We now offer e-Visits for anyone 90 and older to request care online for non-urgent symptoms. For details visit [email or web address].   Also download the MyChart app! Go to the app store, search "MyChart", open the app, select [place], and log in with your MyChart username and password.

## 2023-10-24 NOTE — Progress Notes (Signed)
Patient presents today for Retacrit injection per providers order.  Vital signs WNL. Hgb noted to be 8.7.  Stable during administration without incident; injection site WNL; see MAR for injection details.  Patient tolerated procedure well and without incident.  No questions or complaints noted at this time.

## 2023-10-25 LAB — IGG, IGA, IGM
IgA: 210 mg/dL (ref 64–422)
IgG (Immunoglobin G), Serum: 967 mg/dL (ref 586–1602)
IgM (Immunoglobulin M), Srm: 78 mg/dL (ref 26–217)

## 2023-10-27 ENCOUNTER — Other Ambulatory Visit: Payer: Medicare PPO

## 2023-10-27 LAB — MULTIPLE MYELOMA PANEL, SERUM
Albumin SerPl Elph-Mcnc: 3.6 g/dL (ref 2.9–4.4)
Albumin/Glob SerPl: 1.3 (ref 0.7–1.7)
Alpha 1: 0.2 g/dL (ref 0.0–0.4)
Alpha2 Glob SerPl Elph-Mcnc: 0.7 g/dL (ref 0.4–1.0)
B-Globulin SerPl Elph-Mcnc: 0.9 g/dL (ref 0.7–1.3)
Gamma Glob SerPl Elph-Mcnc: 1 g/dL (ref 0.4–1.8)
Globulin, Total: 2.8 g/dL (ref 2.2–3.9)
IgA: 232 mg/dL (ref 64–422)
IgG (Immunoglobin G), Serum: 1021 mg/dL (ref 586–1602)
IgM (Immunoglobulin M), Srm: 88 mg/dL (ref 26–217)
Total Protein ELP: 6.4 g/dL (ref 6.0–8.5)

## 2023-10-27 LAB — KAPPA/LAMBDA LIGHT CHAINS
Kappa free light chain: 48.5 mg/L — ABNORMAL HIGH (ref 3.3–19.4)
Kappa, lambda light chain ratio: 1.57 (ref 0.26–1.65)
Lambda free light chains: 30.8 mg/L — ABNORMAL HIGH (ref 5.7–26.3)

## 2023-10-28 DIAGNOSIS — M79676 Pain in unspecified toe(s): Secondary | ICD-10-CM | POA: Diagnosis not present

## 2023-10-28 DIAGNOSIS — B351 Tinea unguium: Secondary | ICD-10-CM | POA: Diagnosis not present

## 2023-10-31 DIAGNOSIS — E1042 Type 1 diabetes mellitus with diabetic polyneuropathy: Secondary | ICD-10-CM | POA: Diagnosis not present

## 2023-11-05 ENCOUNTER — Inpatient Hospital Stay: Payer: Medicare PPO

## 2023-11-05 ENCOUNTER — Inpatient Hospital Stay: Payer: Medicare PPO | Admitting: Oncology

## 2023-11-05 ENCOUNTER — Other Ambulatory Visit: Payer: Self-pay | Admitting: *Deleted

## 2023-11-05 DIAGNOSIS — D631 Anemia in chronic kidney disease: Secondary | ICD-10-CM

## 2023-11-06 ENCOUNTER — Ambulatory Visit: Payer: Medicare PPO | Admitting: Family Medicine

## 2023-11-07 ENCOUNTER — Ambulatory Visit: Payer: Medicare PPO | Admitting: Family Medicine

## 2023-11-10 ENCOUNTER — Inpatient Hospital Stay: Payer: Medicare PPO

## 2023-11-10 ENCOUNTER — Inpatient Hospital Stay (HOSPITAL_BASED_OUTPATIENT_CLINIC_OR_DEPARTMENT_OTHER): Payer: Medicare PPO | Admitting: Oncology

## 2023-11-10 ENCOUNTER — Inpatient Hospital Stay: Payer: Medicare PPO | Attending: Hematology

## 2023-11-10 VITALS — BP 146/70 | HR 70 | Temp 98.6°F | Resp 18 | Ht 64.5 in | Wt 135.0 lb

## 2023-11-10 DIAGNOSIS — N184 Chronic kidney disease, stage 4 (severe): Secondary | ICD-10-CM | POA: Insufficient documentation

## 2023-11-10 DIAGNOSIS — D631 Anemia in chronic kidney disease: Secondary | ICD-10-CM

## 2023-11-10 DIAGNOSIS — Z1211 Encounter for screening for malignant neoplasm of colon: Secondary | ICD-10-CM

## 2023-11-10 LAB — CBC
HCT: 27 % — ABNORMAL LOW (ref 36.0–46.0)
Hemoglobin: 8.7 g/dL — ABNORMAL LOW (ref 12.0–15.0)
MCH: 31.3 pg (ref 26.0–34.0)
MCHC: 32.2 g/dL (ref 30.0–36.0)
MCV: 97.1 fL (ref 80.0–100.0)
Platelets: 268 10*3/uL (ref 150–400)
RBC: 2.78 MIL/uL — ABNORMAL LOW (ref 3.87–5.11)
RDW: 16 % — ABNORMAL HIGH (ref 11.5–15.5)
WBC: 7.9 10*3/uL (ref 4.0–10.5)
nRBC: 0 % (ref 0.0–0.2)

## 2023-11-10 MED ORDER — EPOETIN ALFA-EPBX 20000 UNIT/ML IJ SOLN
20000.0000 [IU] | Freq: Once | INTRAMUSCULAR | Status: AC
  Administered 2023-11-10: 20000 [IU] via SUBCUTANEOUS
  Filled 2023-11-10: qty 1

## 2023-11-10 NOTE — Progress Notes (Signed)
 [facility] at [facility] HEMATOLOGY FOLLOW-UP VISIT  [name], Doralee Albino, FNP  REASON FOR FOLLOW-UP: Anemia of chronic kidney disease  ASSESSMENT & PLAN:  Patient is a 73 year old female with chronic kidney disease following for anemia of chronic kidney disease.  Currently on EPO shots   Anemia in chronic kidney disease Patient has stable hemoglobin and has been receiving EPO shots.  Iron labs did not show evidence of iron deficiency.  Patient did not have significant in hemoglobin since the start of erythropoietin shots.  SPEP negative for M spike. - Increase ESA dose to 20,000 units every two weeks. - Monitor hemoglobin levels.  Return to clinic in 1 month with labs.  CKD (chronic kidney disease) stage 4, GFR 15-29 ml/min (HCC) Patient has a history of CKD being followed by Dr. Wolfgang Phoenix -Continue to follow with nephrology  Colon cancer screening Last colonoscopy in 2015, patient declined repeat colonoscopy in the past and stool-based testing.  Currently scheduled to undergo colonoscopy with GI.    Orders Placed This Encounter  Procedures   Comprehensive metabolic panel    Standing Status:   Future    Expected Date:   12/08/2023    Expiration Date:   11/09/2024   CBC with Differential/Platelet    Standing Status:   Future    Expected Date:   12/08/2023    Expiration Date:   11/09/2024    The total time spent in the appointment was 30 minutes encounter with patients including review of chart and various tests results, discussions about plan of care and coordination of care plan   All questions were answered. The patient knows to call the clinic with any problems, questions or concerns. No barriers to learning was detected.  Cindie Crumbly, MD 3/3/20254:49 PM    INTERVAL HISTORY: DEVIKA DRAGOVICH 73 y.o. female being followed for anemia of chronic kidney disease.  She is accompanied by her daughter in law today.she reports feeling better  overall.Previously, the patient experienced nausea, which has since resolved. She is no longer taking Zofran for this symptom. She denies any current gastrointestinal complaints, including blood in the stool.  The patient also reports improved blood pressure control, under the care of her cardiologist.   I have reviewed the past medical history, past surgical history, social history and family history with the patient   ALLERGIES:  is allergic to penicillins, sulfa antibiotics, ciprofloxacin, macrobid [nitrofurantoin monohyd macro], other, and ciprofloxacin hcl.  MEDICATIONS:  Current Outpatient Medications  Medication Sig Dispense Refill   aspirin 81 MG EC tablet Take 1 tablet (81 mg total) by mouth daily. 30 tablet 3   buPROPion (WELLBUTRIN XL) 150 MG 24 hr tablet TAKE 1 TABLET BY MOUTH TWICE A DAY 180 tablet 0   carvedilol (COREG) 12.5 MG tablet TAKE 1 TABLET (12.5MG  TOTAL) BY MOUTH TWICE A DAY WITH MEALS 60 tablet 10   Continuous Blood Gluc Receiver (DEXCOM G6 RECEIVER) DEVI See admin instructions.     Continuous Blood Gluc Sensor (DEXCOM G6 SENSOR) MISC See admin instructions.     epoetin alfa (EPOGEN) 3000 UNIT/ML injection Inject into the skin.     ferrous sulfate 325 (65 FE) MG EC tablet Take 1 tablet (325 mg total) by mouth every other day. 45 tablet 3   insulin aspart (NOVOLOG FLEXPEN) 100 UNIT/ML FlexPen 13 to 17 units at breakfast, 7 to 11 units at lunch and at supper     insulin glargine (LANTUS) 100 UNIT/ML injection Inject 100 Units  into the skin. 18 units AM & 10 units PM     LANTUS SOLOSTAR 100 UNIT/ML Solostar Pen INJECT 20 UNITS EACH MORNING AND 20 UNITS EACH EVENING SUBCUTANEOUS TWICE A DAY 30 DAYS 37     levothyroxine (SYNTHROID) 75 MCG tablet 1 tablet on an empty stomach every morning Orally Once a day for 30 days     metoCLOPramide (REGLAN) 5 MG tablet TAKE 1 TABLET BY MOUTH 3 TIMES DAILY BEFORE MEALS 270 tablet 0   mirtazapine (REMERON) 15 MG tablet TAKE 1 TABLET BY  MOUTH EVERYDAY AT BEDTIME 90 tablet 0   ondansetron (ZOFRAN) 4 MG tablet Take 4 mg by mouth every 8 (eight) hours as needed.     pantoprazole (PROTONIX) 40 MG tablet TAKE 1 TABLET BY MOUTH EVERY DAY 90 tablet 0   potassium chloride (KLOR-CON) 20 MEQ packet Take by mouth 2 (two) times daily.     rosuvastatin (CRESTOR) 40 MG tablet TAKE 1 TABLET BY MOUTH EVERY DAY 90 tablet 0   sodium bicarbonate 650 MG tablet Take 650 mg by mouth 3 (three) times daily.     No current facility-administered medications for this visit.     REVIEW OF SYSTEMS:   Constitutional: Denies fevers, chills or night sweats Eyes: Denies blurriness of vision Ears, nose, mouth, throat, and face: Denies mucositis or sore throat Respiratory: Denies cough, dyspnea or wheezes Cardiovascular: Denies palpitation, chest discomfort or lower extremity swelling Gastrointestinal:  Denies nausea, heartburn or change in bowel habits Skin: Denies abnormal skin rashes Lymphatics: Denies new lymphadenopathy or easy bruising Neurological:Denies numbness, tingling or new weaknesses Behavioral/Psych: Mood is stable, no new changes  All other systems were reviewed with the patient and are negative.  PHYSICAL EXAMINATION:   Vitals:   11/10/23 1330  BP: (!) 146/70  Pulse: 70  Resp: 18  Temp: 98.6 F (37 C)  SpO2: 100%   GENERAL:alert, no distress and comfortable SKIN: skin color, texture, turgor are normal, no rashes or significant lesions LUNGS: clear to auscultation and percussion with normal breathing effort HEART: regular rate & rhythm and no murmurs and no lower extremity edema ABDOMEN:abdomen soft, non-tender and normal bowel sounds Musculoskeletal:no cyanosis of digits and no clubbing  NEURO: alert & oriented x 3 with fluent speech  LABORATORY DATA:  I have reviewed the data as listed  Lab Results  Component Value Date   WBC 7.9 11/10/2023   NEUTROABS 3.7 10/24/2023   HGB 8.7 (L) 11/10/2023   HCT 27.0 (L)  11/10/2023   MCV 97.1 11/10/2023   PLT 268 11/10/2023       Chemistry      Component Value Date/Time   NA 136 10/24/2023 1005   NA 142 06/07/2022 0942   K 3.8 10/24/2023 1005   CL 104 10/24/2023 1005   CO2 23 10/24/2023 1005   BUN 32 (H) 10/24/2023 1005   BUN 40 (H) 06/07/2022 0942   CREATININE 1.45 (H) 10/24/2023 1005   GLU 416 01/18/2022 0000      Component Value Date/Time   CALCIUM 9.1 10/24/2023 1005   ALKPHOS 87 10/24/2023 1005   AST 21 10/24/2023 1005   ALT 18 10/24/2023 1005   BILITOT 0.5 10/24/2023 1005   BILITOT 0.4 07/04/2023 1423      Latest Reference Range & Units 10/22/23 14:10  Total Protein ELP 6.0 - 8.5 g/dL 6.4 (C)  Albumin SerPl Elph-Mcnc 2.9 - 4.4 g/dL 3.6 (C)  Albumin/Glob SerPl 0.7 - 1.7  1.3 (C)  Alpha2 Glob  SerPl Elph-Mcnc 0.4 - 1.0 g/dL 0.7 (C)  Alpha 1 0.0 - 0.4 g/dL 0.2 (C)  Gamma Glob SerPl Elph-Mcnc 0.4 - 1.8 g/dL 1.0 (C)  M Protein SerPl Elph-Mcnc Not Observed g/dL Not Observed (C)  IFE 1  Comment (C)  Globulin, Total 2.2 - 3.9 g/dL 2.8 (C)  B-Globulin SerPl Elph-Mcnc 0.7 - 1.3 g/dL 0.9 (C)  IgG (Immunoglobin G), Serum 586 - 1,602 mg/dL 1,610  IgM (Immunoglobulin M), Srm 26 - 217 mg/dL 88  IgA 64 - 960 mg/dL 454  (C): Corrected   Latest Reference Range & Units 10/24/23 10:05  Iron 28 - 170 ug/dL 72  UIBC ug/dL 098  TIBC 119 - 147 ug/dL 829  Saturation Ratios 10.4 - 31.8 % 28  Ferritin 11 - 307 ng/mL 556 (H)  Vitamin B12 180 - 914 pg/mL 623  (H): Data is abnormally high   Latest Reference Range & Units 10/24/23 10:06  Folate >5.9 ng/mL 8.0

## 2023-11-10 NOTE — Assessment & Plan Note (Signed)
 Patient has a history of CKD being followed by Dr. Wolfgang Phoenix -Continue to follow with nephrology

## 2023-11-10 NOTE — Assessment & Plan Note (Addendum)
 Patient has stable hemoglobin and has been receiving EPO shots.  Iron labs did not show evidence of iron deficiency.  Patient did not have significant in hemoglobin since the start of erythropoietin shots.  SPEP negative for M spike. - Increase ESA dose to 20,000 units every two weeks. - Monitor hemoglobin levels.  Return to clinic in 1 month with labs.

## 2023-11-10 NOTE — Patient Instructions (Signed)

## 2023-11-10 NOTE — Progress Notes (Signed)
 Patient tolerated injection with no complaints voiced.  Site clean and dry with no bruising or swelling noted at site.  See MAR for details.  Band aid applied.  Patient stable during and after injection.  Vss with discharge and left in satisfactory condition with no s/s of distress noted.

## 2023-11-10 NOTE — Assessment & Plan Note (Signed)
 Last colonoscopy in 2015, patient declined repeat colonoscopy in the past and stool-based testing.  Currently scheduled to undergo colonoscopy with GI.

## 2023-11-11 ENCOUNTER — Encounter: Payer: Self-pay | Admitting: Family Medicine

## 2023-11-12 ENCOUNTER — Telehealth (INDEPENDENT_AMBULATORY_CARE_PROVIDER_SITE_OTHER): Payer: Self-pay | Admitting: Gastroenterology

## 2023-11-12 ENCOUNTER — Encounter (INDEPENDENT_AMBULATORY_CARE_PROVIDER_SITE_OTHER): Payer: Self-pay | Admitting: Gastroenterology

## 2023-11-12 ENCOUNTER — Ambulatory Visit (INDEPENDENT_AMBULATORY_CARE_PROVIDER_SITE_OTHER): Payer: Medicare PPO | Admitting: Gastroenterology

## 2023-11-12 ENCOUNTER — Encounter: Payer: Self-pay | Admitting: Oncology

## 2023-11-12 VITALS — BP 147/63 | HR 65 | Temp 97.6°F | Ht 64.5 in | Wt 145.0 lb

## 2023-11-12 DIAGNOSIS — D631 Anemia in chronic kidney disease: Secondary | ICD-10-CM

## 2023-11-12 DIAGNOSIS — R110 Nausea: Secondary | ICD-10-CM

## 2023-11-12 DIAGNOSIS — D649 Anemia, unspecified: Secondary | ICD-10-CM

## 2023-11-12 DIAGNOSIS — R634 Abnormal weight loss: Secondary | ICD-10-CM | POA: Insufficient documentation

## 2023-11-12 DIAGNOSIS — Z1211 Encounter for screening for malignant neoplasm of colon: Secondary | ICD-10-CM

## 2023-11-12 NOTE — Telephone Encounter (Signed)
 Called patient to schedule telephone appointment for preop clearance patient stated she is not going to get that done and has made office aware I made patient aware I will document it on her chart and will remove her from our preop call pool

## 2023-11-12 NOTE — Patient Instructions (Signed)
It was very nice to meet you today, as dicussed with will plan for the following :  1) Upper endoscopy and Colonoscopy

## 2023-11-12 NOTE — Progress Notes (Signed)
 Vista Lawman , M.D. Gastroenterology & Hepatology [facility] Gastroenterology [address] [place], [place] [address] Primary Care Physician: Sonny Masters, FNP [address] [place] [address]  Chief Complaint:  Anemia,  weight loss and nausea   History of Present Illness:  Bridget Martinez is a 73 y.o. female with DM1, HTN, hypothyroidism, CAD s/p CABG x 4 (09/2011), depression and CHF (EF 30-35%)  who presents for evaluation of Anemia, unintentional weight loss and Nausea   Patient reports for past couple months she was nauseous independent of any food intake and lost her appetite.  Loss 20 pounds in a month but fortunately recently her nausea have improved with Zofran and patient is eating and gained 10 pounds.The patient denies having any  fever, chills, hematochezia, melena, hematemesis, abdominal distention, abdominal pain, diarrhea, jaundice, pruritus   Patient has been on Reglan for at least 10 years twice a day as she was told she has gastroparesis.Patient was seen by cardiology 09/2023   Last MWN:[id number]   Last Colonoscopy:2015    FHx: neg for any gastrointestinal/liver disease, no malignancies Social: neg smoking, alcohol or illicit drug use Surgical: no abdominal surgeries  Labs with creatinine 1.45 normal liver enzymes ferritin 5 5 6% saturation 28 hemoglobin 8.7 MCV 97 Past Medical History: Past Medical History:  Diagnosis Date   Arthritis    CHF (congestive heart failure) (HCC)    Coronary artery disease    angina.  MI.    Depression    anxiety   Diabetes mellitus    on insulin, with h/o DKA    GERD (gastroesophageal reflux disease)    Hiatal hernia.    HTN (hypertension)    Hyperlipidemia    Hypothyroidism    Left displaced femoral neck fracture (HCC) 11/19/2014   Myocardial infarction [facility])    NSTEMI 07/2011 with cardiogenic shock, s/p CABG 09/2011   Osteoporosis    Pneumonia        Tuberculosis    Venous  stasis ulcers (HCC)     Past Surgical History: Past Surgical History:  Procedure Laterality Date   CARDIAC CATHETERIZATION     COLONOSCOPY N/A 11/18/2013   Procedure: COLONOSCOPY;  Surgeon: Hart Carwin, MD;  Location: [facility] ENDOSCOPY;  Service: Endoscopy;  Laterality: N/A;   CORONARY ARTERY BYPASS GRAFT     Dr. Zenaida Niece Trigt in 09/2011    CORONARY ARTERY BYPASS GRAFT  2012   ESOPHAGOGASTRODUODENOSCOPY N/A 11/17/2013   Procedure: ESOPHAGOGASTRODUODENOSCOPY (EGD);  Surgeon: Hart Carwin, MD;  Location: [facility] ENDOSCOPY;  Service: Endoscopy;  Laterality: N/A;   LEFT HEART CATHETERIZATION WITH CORONARY ANGIOGRAM N/A 07/26/2011   Procedure: LEFT HEART CATHETERIZATION WITH CORONARY ANGIOGRAM;  Surgeon: Laurey Morale, MD;  Location: [facility] CATH LAB;  Service: Cardiovascular;  Laterality: N/A;   RIGHT HEART CATHETERIZATION N/A 07/29/2011   Procedure: RIGHT HEART CATH;  Surgeon: Rollene Rotunda, MD;  Location[facility] CATH LAB;  Service: Cardiovascular;  Laterality: N/A;   TONSILLECTOMY     TOTAL HIP ARTHROPLASTY Left 11/21/2014   Procedure: TOTAL HIP ARTHROPLASTY ANTERIOR APPROACH;  Surgeon: Kathryne Hitch, MD;  Location: [facility] OR;  Service: Orthopedics;  Laterality: Left;    Family History: Family History  Problem Relation Age of Onset   Heart disease Father    Multiple sclerosis Father    Hypertension Mother    Hyperthyroidism Mother    Diabetes Mother    Heart attack Paternal Grandfather    Heart disease Paternal Grandfather  Diabetes Cousin        Multiple maternal cousins with type 2 diabetes mellitus   Diabetes Maternal Uncle        Type 1 diabetes mellitus   Healthy Daughter    Asthma Son    Diabetes Son     Social History: Social History   Tobacco Use  Smoking Status Never  Smokeless Tobacco Never   Social History   Substance and Sexual Activity  Alcohol Use No   Social History   Substance and Sexual Activity  Drug Use No    Allergies: Allergies  Allergen Reactions    Penicillins Anaphylaxis and Hives    Tolerates zosyn Has patient had a PCN reaction causing immediate rash, facial/tongue/throat swelling, SOB or lightheadedness with hypotension: Yes Has patient had a PCN reaction causing severe rash involving mucus membranes or skin necrosis: No Has patient had a PCN reaction that required hospitalization: Yes Has patient had a PCN reaction occurring within the last 10 years: No If all of the above answers are "NO", then may proceed with Cephalosporin use.    Sulfa Antibiotics Anaphylaxis, Other (See Comments) and Swelling    Stiff neck also   Ciprofloxacin Other (See Comments)   Macrobid [Nitrofurantoin Monohyd Macro] Other (See Comments)    Skin peels off   Other Other (See Comments)   Ciprofloxacin Hcl Other (See Comments)    Skin peeling    Medications: Current Outpatient Medications  Medication Sig Dispense Refill   aspirin 81 MG EC tablet Take 1 tablet (81 mg total) by mouth daily. 30 tablet 3   buPROPion (WELLBUTRIN XL) 150 MG 24 hr tablet TAKE 1 TABLET BY MOUTH TWICE A DAY 180 tablet 0   carvedilol (COREG) 12.5 MG tablet TAKE 1 TABLET (12.5MG  TOTAL) BY MOUTH TWICE A DAY WITH MEALS 60 tablet 10   Continuous Blood Gluc Receiver (DEXCOM G6 RECEIVER) DEVI See admin instructions.     Continuous Blood Gluc Sensor (DEXCOM G6 SENSOR) MISC See admin instructions.     epoetin alfa (EPOGEN) 3000 UNIT/ML injection Inject into the skin.     ferrous sulfate 325 (65 FE) MG EC tablet Take 1 tablet (325 mg total) by mouth every other day. 45 tablet 3   insulin aspart (NOVOLOG FLEXPEN) 100 UNIT/ML FlexPen 13 to 17 units at breakfast, 7 to 11 units at lunch and at supper     insulin glargine (LANTUS) 100 UNIT/ML injection Inject into the skin. 18 units AM & 12 units PM     LANTUS SOLOSTAR 100 UNIT/ML Solostar Pen INJECT 20 UNITS EACH MORNING AND 20 UNITS EACH EVENING SUBCUTANEOUS TWICE A DAY 30 DAYS 37     levothyroxine (SYNTHROID) 75 MCG tablet 1 tablet on an  empty stomach every morning Orally Once a day for 30 days     metoCLOPramide (REGLAN) 5 MG tablet TAKE 1 TABLET BY MOUTH 3 TIMES DAILY BEFORE MEALS 270 tablet 0   mirtazapine (REMERON) 15 MG tablet TAKE 1 TABLET BY MOUTH EVERYDAY AT BEDTIME 90 tablet 0   pantoprazole (PROTONIX) 40 MG tablet TAKE 1 TABLET BY MOUTH EVERY DAY 90 tablet 0   potassium chloride (KLOR-CON) 20 MEQ packet Take by mouth 2 (two) times daily.     rosuvastatin (CRESTOR) 40 MG tablet TAKE 1 TABLET BY MOUTH EVERY DAY 90 tablet 0   sodium bicarbonate 650 MG tablet Take 650 mg by mouth 3 (three) times daily.     ondansetron (ZOFRAN) 4 MG tablet Take 4 mg by  mouth every 8 (eight) hours as needed. (Patient not taking: Reported on 11/12/2023)     No current facility-administered medications for this visit.    Review of Systems: GENERAL: negative for malaise, night sweats HEENT: No changes in hearing or vision, no nose bleeds or other nasal problems. NECK: Negative for lumps, goiter, pain and significant neck swelling RESPIRATORY: Negative for cough, wheezing CARDIOVASCULAR: Negative for chest pain, leg swelling, palpitations, orthopnea GI: SEE HPI MUSCULOSKELETAL: Negative for joint pain or swelling, back pain, and muscle pain. SKIN: Negative for lesions, rash HEMATOLOGY Negative for prolonged bleeding, bruising easily, and swollen nodes. ENDOCRINE: Negative for cold or heat intolerance, polyuria, polydipsia and goiter. NEURO: negative for tremor, gait imbalance, syncope and seizures. The remainder of the review of systems is noncontributory.   Physical Exam: BP (!) 151/66   Pulse 65   Temp 97.6 F (36.4 C)   Ht 5' 4.5" (1.638 m)   Wt 145 lb (65.8 kg)   BMI 24.50 kg/m  GENERAL: The patient is AO x3, in no acute distress.PALE HEENT: Head is normocephalic and atraumatic. EOMI are intact. Mouth is well hydrated and without lesions. NECK: Supple. No masses LUNGS: Clear to auscultation. No presence of  rhonchi/wheezing/rales. Adequate chest expansion HEART: RRR, normal s1 and s2. ABDOMEN: Soft, nontender, no guarding, no peritoneal signs, and nondistended. BS +. No masses.  Imaging/Labs: as above     Latest Ref Rng & Units 11/10/2023    1:05 PM 10/24/2023   10:05 AM 10/08/2023    1:32 PM  CBC  WBC 4.0 - 10.5 K/uL 7.9  6.4  6.6   Hemoglobin 12.0 - 15.0 g/dL 8.7  8.7  8.6   Hematocrit 36.0 - 46.0 % 27.0  26.8  25.8   Platelets 150 - 400 K/uL 268  235  241    Lab Results  Component Value Date   IRON 72 10/24/2023   TIBC 257 10/24/2023   FERRITIN 556 (H) 10/24/2023    I personally reviewed and interpreted the available labs, imaging and endoscopic files.  2015 GES   FINDINGS:  Expected location of the stomach in the left upper quadrant.  Ingested meal empties the stomach gradually over the course of the  study with 5% retention at 60 min and 2% retention at 120 min  (normal retention less than 30% at a 120 min).   IMPRESSION:  Normal gastric emptying study.   Impression and Plan:  LAJUANNA POMPA is a 73 y.o. female with DM1, HTN, hypothyroidism, CAD s/p CABG x 4 (09/2011), depression and CHF (EF 30-35%)  who presents for evaluation of Anemia, unintentional weight loss and Nausea   #Normocytic Anemia #Nausea #Unintentional weight loss   This is an elderly female presenting with multiple symptoms and signs which are considered red flags.  Patient had 20 pound intentional weight loss, new upper GI symptoms with nausea which are indication for bidirectional endoscopy  Patient has chronic anemia at least since 2015 when she had her last endoscopic evaluation.  This anemia could be multifactorial with CKD and anemia of chronic disease.  Patient is following with hematology for that.  Nevertheless her last colonoscopy was 10 years ago.  I thoroughly discussed with the patient and daughter in the clinic the procedure, including the risks involved. Patient understands what the  procedure involves including the benefits and any risks. Patient understands alternatives to the proposed procedure. Risks including (but not limited to) bleeding, tearing of the lining (perforation), rupture of adjacent organs,  problems with heart and lung function, infection, and medication reactions. A small percentage of complications may require surgery, hospitalization, repeat endoscopic procedure, and/or transfusion.  Patient understood and agreed.   We schedule  EGD/Colonoscopy after Cardiology risk stratification and optimization  If above does not explain patient symptoms would recommend CT abdomen pelvis with IV contrast after nephrology clearance as patient has CKD  Even though patient had normal gastric emptying study in 2015 she has been taking Reglan twice daily since then.  Since her symptoms are improving with Reglan and Zofran I am not making any changes to these chronic medications she is taking  All questions were answered.      Vista Lawman, MD Gastroenterology and Hepatology [facility] Gastroenterology   This chart has been completed using [facility] Dictation software, and while attempts have been made to ensure accuracy , certain words and phrases may not be transcribed as intended

## 2023-11-12 NOTE — Telephone Encounter (Signed)
   Name: Bridget Martinez  DOB: March 07, 1951  MRN: [id number]  Primary Cardiologist: None   Preoperative team, please contact this patient and set up a phone call appointment for further preoperative risk assessment. Please obtain consent and complete medication review. Thank you for your help.  I confirm that guidance regarding antiplatelet and oral anticoagulation therapy has been completed and, if necessary, noted below.  Regarding ASA therapy, we recommend continuation of ASA throughout the perioperative period.  However, if the surgeon feels that cessation of ASA is required in the perioperative period, it may be stopped 5-7 days prior to surgery with a plan to resume it as soon as felt to be feasible from a surgical standpoint in the post-operative period.   I also confirmed the patient resides in the state of [place]. As per [facility] Medical Board telemedicine laws, the patient must reside in the state in which the provider is licensed.   Denyce Robert, NP 11/12/2023, 9:32 AM [facility]

## 2023-11-12 NOTE — Telephone Encounter (Signed)
    11/12/23  [name] 24-Sep-1950  What type of surgery is being performed? EGD and Colonoscopy  When is surgery scheduled? TBD  What type of clearance is required (medical or pharmacy to hold medication or both? Medical   Are there any medications that need to be held prior to surgery and how long? N/A  Name of physician performing surgery?  Dr. Starleen Arms Gastroenterology at [facility] Phone: [phone] Fax: [phone]  Anethesia type (none, local, MAC, general)? Choice

## 2023-11-21 ENCOUNTER — Ambulatory Visit: Payer: Medicare PPO | Admitting: Family Medicine

## 2023-11-21 ENCOUNTER — Encounter: Payer: Self-pay | Admitting: Family Medicine

## 2023-11-21 VITALS — BP 144/52 | HR 65 | Temp 96.6°F | Ht 64.5 in | Wt 146.6 lb

## 2023-11-21 DIAGNOSIS — I2510 Atherosclerotic heart disease of native coronary artery without angina pectoris: Secondary | ICD-10-CM | POA: Diagnosis not present

## 2023-11-21 DIAGNOSIS — E1043 Type 1 diabetes mellitus with diabetic autonomic (poly)neuropathy: Secondary | ICD-10-CM

## 2023-11-21 DIAGNOSIS — F339 Major depressive disorder, recurrent, unspecified: Secondary | ICD-10-CM

## 2023-11-21 DIAGNOSIS — N184 Chronic kidney disease, stage 4 (severe): Secondary | ICD-10-CM

## 2023-11-21 DIAGNOSIS — K219 Gastro-esophageal reflux disease without esophagitis: Secondary | ICD-10-CM

## 2023-11-21 DIAGNOSIS — I252 Old myocardial infarction: Secondary | ICD-10-CM | POA: Diagnosis not present

## 2023-11-21 DIAGNOSIS — K3184 Gastroparesis: Secondary | ICD-10-CM

## 2023-11-21 DIAGNOSIS — E785 Hyperlipidemia, unspecified: Secondary | ICD-10-CM

## 2023-11-21 DIAGNOSIS — E1159 Type 2 diabetes mellitus with other circulatory complications: Secondary | ICD-10-CM | POA: Diagnosis not present

## 2023-11-21 DIAGNOSIS — I152 Hypertension secondary to endocrine disorders: Secondary | ICD-10-CM | POA: Diagnosis not present

## 2023-11-21 DIAGNOSIS — E1069 Type 1 diabetes mellitus with other specified complication: Secondary | ICD-10-CM

## 2023-11-21 MED ORDER — MIRTAZAPINE 15 MG PO TABS: 15.0000 mg | ORAL_TABLET | Freq: Every day | ORAL | 1 refills | Status: DC

## 2023-11-21 MED ORDER — METOCLOPRAMIDE HCL 5 MG PO TABS
5.0000 mg | ORAL_TABLET | Freq: Three times a day (TID) | ORAL | 0 refills | Status: DC
Start: 2023-11-21 — End: 2024-02-04

## 2023-11-21 MED ORDER — BUPROPION HCL ER (XL) 150 MG PO TB24: 150.0000 mg | ORAL_TABLET | Freq: Two times a day (BID) | ORAL | 1 refills | Status: DC

## 2023-11-21 MED ORDER — PANTOPRAZOLE SODIUM 40 MG PO TBEC
40.0000 mg | DELAYED_RELEASE_TABLET | Freq: Every day | ORAL | 1 refills | Status: DC
Start: 2023-11-21 — End: 2024-07-19

## 2023-11-21 MED ORDER — ROSUVASTATIN CALCIUM 40 MG PO TABS
40.0000 mg | ORAL_TABLET | Freq: Every day | ORAL | 1 refills | Status: DC
Start: 2023-11-21 — End: 2024-05-26

## 2023-11-21 NOTE — Progress Notes (Signed)
 Subjective:  Patient ID: Bridget Martinez, female    DOB: Dec 24, 1950, 72 y.o.   MRN: [id number]  Patient Care Team: Sonny Masters, FNP as PCP - General (Family Medicine) [name], Bevelyn Buckles, MD (Cardiology) Talmage Coin, MD as Attending Physician (Internal Medicine) Randa Lynn, MD as Consulting Physician (Nephrology) Sherrie George, MD as Consulting Physician (Ophthalmology)   Chief Complaint:  Medical Management of Chronic Issues (4 month chronic follow up )   HPI: Bridget Martinez is a 73 y.o. female presenting on 11/21/2023 for Medical Management of Chronic Issues (4 month chronic follow up )   Discussed the use of AI scribe software for clinical note transcription with the patient, who gave verbal consent to proceed.  History of Present Illness   Bridget Martinez is a 73 year old female with gastroparesis who presents for a routine follow-up and medication refill.  She has a history of gastroparesis and was previously taking Reglan three times a day but reduced the dose to twice a day due to facial tremors. She continues to take Reglan twice daily without further side effects. Nausea, which was previously present, has resolved.  She has a history of diabetes and sees her endocrinologist regularly. There have been no changes in her diabetes medication regimen. Blood sugars have been running high, but she does not recall her last A1c value.  She has a history of acid reflux, which is currently well-controlled with Protonix.  She has a history of depression and anxiety, managed with Remeron and Wellbutrin. She is doing well with these medications and has no current issues with her mental health.  She experienced weight loss, having lost 20 pounds in a month during a period of nausea, but her weight has since returned to normal. She was down to 135 pounds but has regained weight since then.  No changes in urine output, headaches, leg swelling, shortness of breath,  chest pain, dizziness, or significant arthritis pain. She is sleeping well and having regular bowel movements.        11/21/2023    9:08 AM 07/28/2023    2:27 PM 07/14/2023    2:27 PM 07/04/2023    2:02 PM 04/07/2023   12:11 PM  Depression screen PHQ 2/9  Decreased Interest 1 0 1 1 0  Down, Depressed, Hopeless 1 0 1 0 0  PHQ - 2 Score 2 0 2 1 0  Altered sleeping 1 0 1 1   Tired, decreased energy 1 0 1 1   Change in appetite 0 0 0 0   Feeling bad or failure about yourself  0 0 1 1   Trouble concentrating 0 0 1 1   Moving slowly or fidgety/restless 0  0 0   Suicidal thoughts 0 0 0 0   PHQ-9 Score 4 0 6 5   Difficult doing work/chores Somewhat difficult  Not difficult at all Not difficult at all       11/21/2023    9:08 AM 07/04/2023    2:02 PM 09/13/2022    8:57 AM 06/07/2022    9:11 AM  GAD 7 : Generalized Anxiety Score  Nervous, Anxious, on Edge 0 0 1 1  Control/stop worrying 0 0 0 1  Worry too much - different things 0 0 0 1  Trouble relaxing 0 0 0 1  Restless 0 0 0 0  Easily annoyed or irritable 0 0 0 1  Afraid - awful might happen 0 0 0  1  Total GAD 7 Score 0 0 1 6  Anxiety Difficulty Not difficult at all Not difficult at all           Relevant past medical, surgical, family, and social history reviewed and updated as indicated.  Allergies and medications reviewed and updated. Data reviewed: Chart in Epic.   Past Medical History:  Diagnosis Date   Arthritis    CHF (congestive heart failure) (HCC)    Coronary artery disease    angina.  MI.    Depression    anxiety   Diabetes mellitus    on insulin, with h/o DKA    GERD (gastroesophageal reflux disease)    Hiatal hernia.    HTN (hypertension)    Hyperlipidemia    Hypothyroidism    Left displaced femoral neck fracture (HCC) 11/19/2014   Myocardial infarction [facility])    NSTEMI 07/2011 with cardiogenic shock, s/p CABG 09/2011   Osteoporosis    Pneumonia        Tuberculosis    Venous stasis ulcers (HCC)      Past Surgical History:  Procedure Laterality Date   CARDIAC CATHETERIZATION     COLONOSCOPY N/A 11/18/2013   Procedure: COLONOSCOPY;  Surgeon: Hart Carwin, MD;  Location: [facility] ENDOSCOPY;  Service: Endoscopy;  Laterality: N/A;   CORONARY ARTERY BYPASS GRAFT     Dr. Zenaida Niece Trigt in 09/2011    CORONARY ARTERY BYPASS GRAFT  2012   ESOPHAGOGASTRODUODENOSCOPY N/A 11/17/2013   Procedure: ESOPHAGOGASTRODUODENOSCOPY (EGD);  Surgeon: Hart Carwin, MD;  Location: [facility] ENDOSCOPY;  Service: Endoscopy;  Laterality: N/A;   LEFT HEART CATHETERIZATION WITH CORONARY ANGIOGRAM N/A 07/26/2011   Procedure: LEFT HEART CATHETERIZATION WITH CORONARY ANGIOGRAM;  Surgeon: Laurey Morale, MD;  Location: [facility] CATH LAB;  Service: Cardiovascular;  Laterality: N/A;   RIGHT HEART CATHETERIZATION N/A 07/29/2011   Procedure: RIGHT HEART CATH;  Surgeon: Rollene Rotunda, MD;  Location: [facility] CATH LAB;  Service: Cardiovascular;  Laterality: N/A;   TONSILLECTOMY     TOTAL HIP ARTHROPLASTY Left 11/21/2014   Procedure: TOTAL HIP ARTHROPLASTY ANTERIOR APPROACH;  Surgeon: Kathryne Hitch, MD;  Location: [facility] OR;  Service: Orthopedics;  Laterality: Left;    Social History   Socioeconomic History   Marital status: Widowed    Spouse name: Bernette Redbird; Son Annette Stable   Number of children: 2   Years of education: 18   Highest education level: Bachelor's degree (e.g., BA, AB, BS)  Occupational History   Occupation: Retired  Tobacco Use   Smoking status: Never   Smokeless tobacco: Never  Vaping Use   Vaping status: Never Used  Substance and Sexual Activity   Alcohol use: No   Drug use: No   Sexual activity: Not on file  Other Topics Concern   Not on file  Social History Narrative   Lives alone, suffers from depression since losing her husband. Son, Annette Stable takes her to appointments and helps her a lot.   Social Drivers of Corporate investment banker Strain: Low Risk  (04/07/2023)   Overall Financial Resource Strain (CARDIA)     Difficulty of Paying Living Expenses: Not hard at all  Food Insecurity: No Food Insecurity (04/07/2023)   Hunger Vital Sign    Worried About Running Out of Food in the Last Year: Never true    Ran Out of Food in the Last Year: Never true  Transportation Needs: No Transportation Needs (04/07/2023)   PRAPARE - Administrator, Civil Service (Medical): No  Lack of Transportation (Non-Medical): No  Physical Activity: Insufficiently Active (04/07/2023)   Exercise Vital Sign    Days of Exercise per Week: 3 days    Minutes of Exercise per Session: 20 min  Stress: No Stress Concern Present (04/07/2023)   [employer] of Occupational Health - Occupational Stress Questionnaire    Feeling of Stress : Not at all  Social Connections: Moderately Isolated (04/07/2023)   Social Connection and Isolation Panel [NHANES]    Frequency of Communication with Friends and Family: More than three times a week    Frequency of Social Gatherings with Friends and Family: More than three times a week    Attends Religious Services: More than 4 times per year    Active Member of [employer] or Organizations: No    Attends Banker Meetings: Never    Marital Status: Widowed  Intimate Partner Violence: Not At Risk (04/07/2023)   Humiliation, Afraid, Rape, and Kick questionnaire    Fear of Current or Ex-Partner: No    Emotionally Abused: No    Physically Abused: No    Sexually Abused: No    Outpatient Encounter Medications as of 11/21/2023  Medication Sig   aspirin 81 MG EC tablet Take 1 tablet (81 mg total) by mouth daily.   carvedilol (COREG) 12.5 MG tablet TAKE 1 TABLET (12.5MG  TOTAL) BY MOUTH TWICE A DAY WITH MEALS   Continuous Blood Gluc Receiver (DEXCOM G6 RECEIVER) DEVI See admin instructions.   Continuous Blood Gluc Sensor (DEXCOM G6 SENSOR) MISC See admin instructions.   epoetin alfa (EPOGEN) 3000 UNIT/ML injection Inject into the skin.   ferrous sulfate 325 (65 FE) MG EC tablet Take 1  tablet (325 mg total) by mouth every other day.   insulin aspart (NOVOLOG FLEXPEN) 100 UNIT/ML FlexPen 13 to 17 units at breakfast, 7 to 11 units at lunch and at supper   insulin glargine (LANTUS) 100 UNIT/ML injection Inject into the skin. 18 units AM & 12 units PM   LANTUS SOLOSTAR 100 UNIT/ML Solostar Pen INJECT 20 UNITS EACH MORNING AND 20 UNITS EACH EVENING SUBCUTANEOUS TWICE A DAY 30 DAYS 37   levothyroxine (SYNTHROID) 75 MCG tablet 1 tablet on an empty stomach every morning Orally Once a day for 30 days   ondansetron (ZOFRAN) 4 MG tablet Take 4 mg by mouth every 8 (eight) hours as needed.   potassium chloride (KLOR-CON) 20 MEQ packet Take by mouth 2 (two) times daily.   sodium bicarbonate 650 MG tablet Take 650 mg by mouth 3 (three) times daily.   [DISCONTINUED] buPROPion (WELLBUTRIN XL) 150 MG 24 hr tablet TAKE 1 TABLET BY MOUTH TWICE A DAY   [DISCONTINUED] metoCLOPramide (REGLAN) 5 MG tablet TAKE 1 TABLET BY MOUTH 3 TIMES DAILY BEFORE MEALS   [DISCONTINUED] mirtazapine (REMERON) 15 MG tablet TAKE 1 TABLET BY MOUTH EVERYDAY AT BEDTIME   [DISCONTINUED] pantoprazole (PROTONIX) 40 MG tablet TAKE 1 TABLET BY MOUTH EVERY DAY   [DISCONTINUED] rosuvastatin (CRESTOR) 40 MG tablet TAKE 1 TABLET BY MOUTH EVERY DAY   buPROPion (WELLBUTRIN XL) 150 MG 24 hr tablet Take 1 tablet (150 mg total) by mouth 2 (two) times daily.   metoCLOPramide (REGLAN) 5 MG tablet Take 1 tablet (5 mg total) by mouth 3 (three) times daily before meals.   mirtazapine (REMERON) 15 MG tablet Take 1 tablet (15 mg total) by mouth at bedtime.   pantoprazole (PROTONIX) 40 MG tablet Take 1 tablet (40 mg total) by mouth daily.   rosuvastatin (CRESTOR)  40 MG tablet Take 1 tablet (40 mg total) by mouth daily.   No facility-administered encounter medications on file as of 11/21/2023.    Allergies  Allergen Reactions   Penicillins Anaphylaxis and Hives    Tolerates zosyn Has patient had a PCN reaction causing immediate rash,  facial/tongue/throat swelling, SOB or lightheadedness with hypotension: Yes Has patient had a PCN reaction causing severe rash involving mucus membranes or skin necrosis: No Has patient had a PCN reaction that required hospitalization: Yes Has patient had a PCN reaction occurring within the last 10 years: No If all of the above answers are "NO", then may proceed with Cephalosporin use.    Sulfa Antibiotics Anaphylaxis, Other (See Comments) and Swelling    Stiff neck also   Ciprofloxacin Other (See Comments)   Macrobid [Nitrofurantoin Monohyd Macro] Other (See Comments)    Skin peels off   Other Other (See Comments)   Ciprofloxacin Hcl Other (See Comments)    Skin peeling    Pertinent ROS per HPI, otherwise unremarkable      Objective:  BP (!) 144/52   Pulse 65   Temp (!) 96.6 F (35.9 C)   Ht 5' 4.5" (1.638 m)   Wt 146 lb 9.6 oz (66.5 kg)   SpO2 100%   BMI 24.78 kg/m    Wt Readings from Last 3 Encounters:  11/21/23 146 lb 9.6 oz (66.5 kg)  11/12/23 145 lb (65.8 kg)  11/10/23 135 lb (61.2 kg)    Physical Exam Vitals and nursing note reviewed.  Constitutional:      General: She is not in acute distress.    Appearance: She is obese. She is ill-appearing (chronically ill). She is not toxic-appearing or diaphoretic.  HENT:     Head: Normocephalic and atraumatic.     Right Ear: Tympanic membrane, ear canal and external ear normal.     Left Ear: Tympanic membrane, ear canal and external ear normal.     Nose: Nose normal.     Mouth/Throat:     Mouth: Mucous membranes are moist.  Eyes:     Conjunctiva/sclera: Conjunctivae normal.     Pupils: Pupils are equal, round, and reactive to light.  Cardiovascular:     Rate and Rhythm: Normal rate and regular rhythm.     Heart sounds: Murmur heard.     Systolic murmur is present with a grade of 2/6.  Pulmonary:     Effort: Pulmonary effort is normal.     Breath sounds: Normal breath sounds.  Musculoskeletal:     Right lower  leg: Edema present.     Left lower leg: Edema present.  Skin:    General: Skin is warm and dry.     Capillary Refill: Capillary refill takes less than 2 seconds.  Neurological:     General: No focal deficit present.     Mental Status: She is alert. Mental status is at baseline.  Psychiatric:        Mood and Affect: Mood normal.        Behavior: Behavior normal.        Thought Content: Thought content normal.        Judgment: Judgment normal.      Results for orders placed or performed in visit on 11/10/23  CBC   Collection Time: 11/10/23  1:05 PM  Result Value Ref Range   WBC 7.9 4.0 - 10.5 K/uL   RBC 2.78 (L) 3.87 - 5.11 MIL/uL   Hemoglobin 8.7 (L) 12.0 -  15.0 g/dL   HCT 86.5 (L) 78.4 - 69.6 %   MCV 97.1 80.0 - 100.0 fL   MCH 31.3 26.0 - 34.0 pg   MCHC 32.2 30.0 - 36.0 g/dL   RDW 29.5 (H) 28.4 - 13.2 %   Platelets 268 150 - 400 K/uL   nRBC 0.0 0.0 - 0.2 %       Pertinent labs & imaging results that were available during my care of the patient were reviewed by me and considered in my medical decision making.  Assessment & Plan:  Breona was seen today for medical management of chronic issues.  Diagnoses and all orders for this visit:  CKD (chronic kidney disease) stage 4, GFR 15-29 ml/min (HCC) -     CBC with Differential/Platelet -     CMP14+EGFR -     VITAMIN D 25 Hydroxy (Vit-D Deficiency, Fractures)  Hypertension associated with diabetes (HCC) -     CBC with Differential/Platelet -     CMP14+EGFR -     Lipid panel -     Thyroid Panel With TSH  Hyperlipidemia due to type 1 diabetes mellitus (HCC) -     rosuvastatin (CRESTOR) 40 MG tablet; Take 1 tablet (40 mg total) by mouth daily. -     CMP14+EGFR -     Lipid panel  Diabetic gastroparesis associated with type 1 diabetes mellitus (HCC) -     metoCLOPramide (REGLAN) 5 MG tablet; Take 1 tablet (5 mg total) by mouth 3 (three) times daily before meals. -     CBC with Differential/Platelet -      CMP14+EGFR  Recurrent depression (HCC) -     buPROPion (WELLBUTRIN XL) 150 MG 24 hr tablet; Take 1 tablet (150 mg total) by mouth 2 (two) times daily. -     mirtazapine (REMERON) 15 MG tablet; Take 1 tablet (15 mg total) by mouth at bedtime. -     Thyroid Panel With TSH -     VITAMIN D 25 Hydroxy (Vit-D Deficiency, Fractures)  Gastroesophageal reflux disease without esophagitis -     pantoprazole (PROTONIX) 40 MG tablet; Take 1 tablet (40 mg total) by mouth daily. -     CBC with Differential/Platelet  CAD, multiple vessel -     rosuvastatin (CRESTOR) 40 MG tablet; Take 1 tablet (40 mg total) by mouth daily. -     CBC with Differential/Platelet -     CMP14+EGFR -     Lipid panel  History of non-ST elevation myocardial infarction (NSTEMI) -     rosuvastatin (CRESTOR) 40 MG tablet; Take 1 tablet (40 mg total) by mouth daily. -     CBC with Differential/Platelet -     CMP14+EGFR -     Lipid panel     Assessment and Plan    Gastroparesis Chronic gastroparesis managed with Reglan (metoclopramide). She experienced nausea but has improved. Previously taking Reglan three times a day but reduced to twice a day due to side effects of tremors, likely tardive dyskinesia or EPS. Informed about the risk of tremors and EPS with Reglan use. - Continue Reglan twice a day - Monitor for tremors or EPS  Diabetes Mellitus Under regular endocrinology care. Blood sugars have been running high. Last A1c not available. - Perform lab tests - Continue current diabetes management regimen  Depression and Anxiety Reports doing well with current medication regimen. No changes in symptoms reported. - Continue Remeron and Wellbutrin  Gastroesophageal Reflux Disease (GERD) GERD is well-controlled with current  medication. - Continue Protonix  Hyperlipidemia On cholesterol medication with no reported side effects. - Continue current cholesterol medication  General Health Maintenance Routine check-up  with no new concerns. Regular follow-ups with specialists including endocrinology, oncology, and cardiology. Labs to be performed today for results to be available for upcoming specialist visits. - Perform lab tests - Schedule follow-up in six months unless new issues arise          Continue all other maintenance medications.  Follow up plan: Return in about 6 months (around 05/23/2024) for chronic follow up.   Continue healthy lifestyle choices, including diet (rich in fruits, vegetables, and lean proteins, and low in salt and simple carbohydrates) and exercise (at least 30 minutes of moderate physical activity daily).  Educational handout given for gastroparesis   The above assessment and management plan was discussed with the patient. The patient verbalized understanding of and has agreed to the management plan. Patient is aware to call the clinic if they develop any new symptoms or if symptoms persist or worsen. Patient is aware when to return to the clinic for a follow-up visit. Patient educated on when it is appropriate to go to the emergency department.   Kari Baars, FNP-C [facility] [phone]

## 2023-11-22 LAB — LIPID PANEL
Chol/HDL Ratio: 1.8 ratio (ref 0.0–4.4)
Cholesterol, Total: 132 mg/dL (ref 100–199)
HDL: 73 mg/dL (ref >39)
LDL Chol Calc (NIH): 48 mg/dL (ref 0–99)
Triglycerides: 45 mg/dL (ref 0–149)
VLDL Cholesterol Cal: 11 mg/dL (ref 5–40)

## 2023-11-22 LAB — CMP14+EGFR
ALT: 35 IU/L — ABNORMAL HIGH (ref 0–32)
AST: 34 IU/L (ref 0–40)
Albumin: 4.2 g/dL (ref 3.8–4.8)
Alkaline Phosphatase: 109 IU/L (ref 44–121)
BUN/Creatinine Ratio: 21 (ref 12–28)
BUN: 32 mg/dL — ABNORMAL HIGH (ref 8–27)
Bilirubin Total: 0.4 mg/dL (ref 0.0–1.2)
CO2: 21 mmol/L (ref 20–29)
Calcium: 9.3 mg/dL (ref 8.7–10.3)
Chloride: 102 mmol/L (ref 96–106)
Creatinine, Ser: 1.53 mg/dL — ABNORMAL HIGH (ref 0.57–1.00)
Globulin, Total: 2.3 g/dL (ref 1.5–4.5)
Glucose: 145 mg/dL — ABNORMAL HIGH (ref 70–99)
Potassium: 4.8 mmol/L (ref 3.5–5.2)
Sodium: 139 mmol/L (ref 134–144)
Total Protein: 6.5 g/dL (ref 6.0–8.5)
eGFR: 36 mL/min/{1.73_m2} — ABNORMAL LOW (ref >59)

## 2023-11-22 LAB — CBC WITH DIFFERENTIAL/PLATELET
Basophils Absolute: 0 10*3/uL (ref 0.0–0.2)
Basos: 1 %
EOS (ABSOLUTE): 0.1 10*3/uL (ref 0.0–0.4)
Eos: 1 %
Hematocrit: 29.7 % — ABNORMAL LOW (ref 34.0–46.6)
Hemoglobin: 9.3 g/dL — ABNORMAL LOW (ref 11.1–15.9)
Immature Grans (Abs): 0 10*3/uL (ref 0.0–0.1)
Immature Granulocytes: 1 %
Lymphocytes Absolute: 1.3 10*3/uL (ref 0.7–3.1)
Lymphs: 15 %
MCH: 30.3 pg (ref 26.6–33.0)
MCHC: 31.3 g/dL — ABNORMAL LOW (ref 31.5–35.7)
MCV: 97 fL (ref 79–97)
Monocytes Absolute: 0.7 10*3/uL (ref 0.1–0.9)
Monocytes: 8 %
Neutrophils Absolute: 6.5 10*3/uL (ref 1.4–7.0)
Neutrophils: 74 %
Platelets: 339 10*3/uL (ref 150–450)
RBC: 3.07 x10E6/uL — ABNORMAL LOW (ref 3.77–5.28)
RDW: 15 % (ref 11.7–15.4)
WBC: 8.6 10*3/uL (ref 3.4–10.8)

## 2023-11-22 LAB — THYROID PANEL WITH TSH
Free Thyroxine Index: 2.8 (ref 1.2–4.9)
T3 Uptake Ratio: 32 % (ref 24–39)
T4, Total: 8.7 ug/dL (ref 4.5–12.0)
TSH: 0.663 u[IU]/mL (ref 0.450–4.500)

## 2023-11-22 LAB — VITAMIN D 25 HYDROXY (VIT D DEFICIENCY, FRACTURES): Vit D, 25-Hydroxy: 25.8 ng/mL — ABNORMAL LOW (ref 30.0–100.0)

## 2023-11-24 ENCOUNTER — Inpatient Hospital Stay

## 2023-11-24 VITALS — BP 136/43 | HR 61 | Temp 96.2°F | Resp 18

## 2023-11-24 DIAGNOSIS — D631 Anemia in chronic kidney disease: Secondary | ICD-10-CM | POA: Diagnosis not present

## 2023-11-24 DIAGNOSIS — N184 Chronic kidney disease, stage 4 (severe): Secondary | ICD-10-CM

## 2023-11-24 LAB — CBC
HCT: 27.6 % — ABNORMAL LOW (ref 36.0–46.0)
Hemoglobin: 8.9 g/dL — ABNORMAL LOW (ref 12.0–15.0)
MCH: 31 pg (ref 26.0–34.0)
MCHC: 32.2 g/dL (ref 30.0–36.0)
MCV: 96.2 fL (ref 80.0–100.0)
Platelets: 270 10*3/uL (ref 150–400)
RBC: 2.87 MIL/uL — ABNORMAL LOW (ref 3.87–5.11)
RDW: 16.3 % — ABNORMAL HIGH (ref 11.5–15.5)
WBC: 8.7 10*3/uL (ref 4.0–10.5)
nRBC: 0 % (ref 0.0–0.2)

## 2023-11-24 MED ORDER — EPOETIN ALFA-EPBX 40000 UNIT/ML IJ SOLN: 20000.0000 [IU] | Freq: Once | INTRAMUSCULAR | Status: DC

## 2023-11-24 MED ORDER — EPOETIN ALFA-EPBX 10000 UNIT/ML IJ SOLN
20000.0000 [IU] | Freq: Once | INTRAMUSCULAR | Status: AC
  Administered 2023-11-24: 20000 [IU] via SUBCUTANEOUS
  Filled 2023-11-24: qty 2

## 2023-11-24 NOTE — Patient Instructions (Signed)
 [facility] [place] - A DEPT OF [facility]  Discharge Instructions: Thank you for choosing [facility] to provide your oncology and hematology care.  If you have a lab appointment with the Cancer Center - please note that after April 8th, 2024, all labs will be drawn in the cancer center.  You do not have to check in or register with the main entrance as you have in the past but will complete your check-in in the cancer center.  Wear comfortable clothing and clothing appropriate for easy access to any Portacath or PICC line.   We strive to give you quality time with your provider. You may need to reschedule your appointment if you arrive late (15 or more minutes).  Arriving late affects you and other patients whose appointments are after yours.  Also, if you miss three or more appointments without notifying the office, you may be dismissed from the clinic at the provider's discretion.      For prescription refill requests, have your pharmacy contact our office and allow 72 hours for refills to be completed.    Today you received Retacrit 20,000U injection     BELOW ARE SYMPTOMS THAT SHOULD BE REPORTED IMMEDIATELY: *FEVER GREATER THAN 100.4 F (38 C) OR HIGHER *CHILLS OR SWEATING *NAUSEA AND VOMITING THAT IS NOT CONTROLLED WITH YOUR NAUSEA MEDICATION *UNUSUAL SHORTNESS OF BREATH *UNUSUAL BRUISING OR BLEEDING *URINARY PROBLEMS (pain or burning when urinating, or frequent urination) *BOWEL PROBLEMS (unusual diarrhea, constipation, pain near the anus) TENDERNESS IN MOUTH AND THROAT WITH OR WITHOUT PRESENCE OF ULCERS (sore throat, sores in mouth, or a toothache) UNUSUAL RASH, SWELLING OR PAIN  UNUSUAL VAGINAL DISCHARGE OR ITCHING   Items with * indicate a potential emergency and should be followed up as soon as possible or go to the Emergency Department if any problems should occur.  Please show the CHEMOTHERAPY ALERT CARD or IMMUNOTHERAPY ALERT CARD at  check-in to the Emergency Department and triage nurse.  Should you have questions after your visit or need to cancel or reschedule your appointment, please contact [facility] [place] - A DEPT OF [facility] [phone]  and follow the prompts.  Office hours are 8:00 a.m. to 4:30 p.m. Monday - Friday. Please note that voicemails left after 4:00 p.m. may not be returned until the following business day.  We are closed weekends and major holidays. You have access to a nurse at all times for urgent questions. Please call the main number to the clinic [phone] and follow the prompts.  For any non-urgent questions, you may also contact your provider using MyChart. We now offer e-Visits for anyone 34 and older to request care online for non-urgent symptoms. For details visit [email or web address].   Also download the MyChart app! Go to the app store, search "MyChart", open the app, select [facility], and log in with your MyChart username and password.

## 2023-11-24 NOTE — Progress Notes (Signed)
 Bridget Martinez presents today for injection per the provider's orders. Retacrit  20,000U administration without incident; injection site WNL; see MAR for injection details.  Patient tolerated procedure well and without incident.  No questions or complaints noted at this time. Patient's hemoglobin noted to be 8.9 today.  Discharged from clinic via wheelchair in stable condition. Alert and oriented x 3. F/U with [facility] as scheduled.

## 2023-11-29 DIAGNOSIS — E1042 Type 1 diabetes mellitus with diabetic polyneuropathy: Secondary | ICD-10-CM | POA: Diagnosis not present

## 2023-12-05 DIAGNOSIS — E211 Secondary hyperparathyroidism, not elsewhere classified: Secondary | ICD-10-CM | POA: Diagnosis not present

## 2023-12-05 DIAGNOSIS — D631 Anemia in chronic kidney disease: Secondary | ICD-10-CM | POA: Diagnosis not present

## 2023-12-05 DIAGNOSIS — E039 Hypothyroidism, unspecified: Secondary | ICD-10-CM | POA: Diagnosis not present

## 2023-12-05 DIAGNOSIS — Z794 Long term (current) use of insulin: Secondary | ICD-10-CM | POA: Diagnosis not present

## 2023-12-05 DIAGNOSIS — E1043 Type 1 diabetes mellitus with diabetic autonomic (poly)neuropathy: Secondary | ICD-10-CM | POA: Diagnosis not present

## 2023-12-05 DIAGNOSIS — N189 Chronic kidney disease, unspecified: Secondary | ICD-10-CM | POA: Diagnosis not present

## 2023-12-05 DIAGNOSIS — R809 Proteinuria, unspecified: Secondary | ICD-10-CM | POA: Diagnosis not present

## 2023-12-05 DIAGNOSIS — D649 Anemia, unspecified: Secondary | ICD-10-CM | POA: Diagnosis not present

## 2023-12-05 DIAGNOSIS — E1065 Type 1 diabetes mellitus with hyperglycemia: Secondary | ICD-10-CM | POA: Diagnosis not present

## 2023-12-05 DIAGNOSIS — E1042 Type 1 diabetes mellitus with diabetic polyneuropathy: Secondary | ICD-10-CM | POA: Diagnosis not present

## 2023-12-08 ENCOUNTER — Inpatient Hospital Stay

## 2023-12-08 VITALS — BP 162/55 | HR 67 | Temp 97.6°F | Resp 18

## 2023-12-08 DIAGNOSIS — N184 Chronic kidney disease, stage 4 (severe): Secondary | ICD-10-CM | POA: Diagnosis not present

## 2023-12-08 DIAGNOSIS — D631 Anemia in chronic kidney disease: Secondary | ICD-10-CM | POA: Diagnosis not present

## 2023-12-08 LAB — CBC WITH DIFFERENTIAL/PLATELET
Abs Immature Granulocytes: 0.02 10*3/uL (ref 0.00–0.07)
Basophils Absolute: 0.1 10*3/uL (ref 0.0–0.1)
Basophils Relative: 1 %
Eosinophils Absolute: 0.3 10*3/uL (ref 0.0–0.5)
Eosinophils Relative: 4 %
HCT: 30.5 % — ABNORMAL LOW (ref 36.0–46.0)
Hemoglobin: 9.7 g/dL — ABNORMAL LOW (ref 12.0–15.0)
Immature Granulocytes: 0 %
Lymphocytes Relative: 18 %
Lymphs Abs: 1.4 10*3/uL (ref 0.7–4.0)
MCH: 30.2 pg (ref 26.0–34.0)
MCHC: 31.8 g/dL (ref 30.0–36.0)
MCV: 95 fL (ref 80.0–100.0)
Monocytes Absolute: 0.7 10*3/uL (ref 0.1–1.0)
Monocytes Relative: 9 %
Neutro Abs: 5.1 10*3/uL (ref 1.7–7.7)
Neutrophils Relative %: 68 %
Platelets: 242 10*3/uL (ref 150–400)
RBC: 3.21 MIL/uL — ABNORMAL LOW (ref 3.87–5.11)
RDW: 16.3 % — ABNORMAL HIGH (ref 11.5–15.5)
WBC: 7.5 10*3/uL (ref 4.0–10.5)
nRBC: 0 % (ref 0.0–0.2)

## 2023-12-08 LAB — COMPREHENSIVE METABOLIC PANEL WITH GFR
ALT: 31 U/L (ref 0–44)
AST: 32 U/L (ref 15–41)
Albumin: 3.8 g/dL (ref 3.5–5.0)
Alkaline Phosphatase: 74 U/L (ref 38–126)
Anion gap: 12 (ref 5–15)
BUN: 43 mg/dL — ABNORMAL HIGH (ref 8–23)
CO2: 21 mmol/L — ABNORMAL LOW (ref 22–32)
Calcium: 9.3 mg/dL (ref 8.9–10.3)
Chloride: 101 mmol/L (ref 98–111)
Creatinine, Ser: 2.29 mg/dL — ABNORMAL HIGH (ref 0.44–1.00)
GFR, Estimated: 22 mL/min — ABNORMAL LOW (ref >60)
Glucose, Bld: 256 mg/dL — ABNORMAL HIGH (ref 70–99)
Potassium: 5 mmol/L (ref 3.5–5.1)
Sodium: 134 mmol/L — ABNORMAL LOW (ref 135–145)
Total Bilirubin: 0.6 mg/dL (ref 0.0–1.2)
Total Protein: 7.1 g/dL (ref 6.5–8.1)

## 2023-12-08 MED ORDER — EPOETIN ALFA-EPBX 40000 UNIT/ML IJ SOLN
20000.0000 [IU] | Freq: Once | INTRAMUSCULAR | Status: AC
  Administered 2023-12-08: 20000 [IU] via SUBCUTANEOUS
  Filled 2023-12-08: qty 1

## 2023-12-08 NOTE — Progress Notes (Signed)
 Hemoglobin today is 9.7.  We will proceed with Retacrit injection per provider orders.  Patient tolerated Retacrit injection with no complaints voiced.  Site clean and dry with no bruising or swelling noted.  No complaints of pain.  Discharged with vital signs stable and no signs or symptoms of distress noted.

## 2023-12-08 NOTE — Patient Instructions (Signed)
 [facility] [place] - A DEPT OF [facility]  Discharge Instructions: Thank you for choosing [facility] to provide your oncology and hematology care.  If you have a lab appointment with the Cancer Center - please note that after April 8th, 2024, all labs will be drawn in the cancer center.  You do not have to check in or register with the main entrance as you have in the past but will complete your check-in in the cancer center.  Wear comfortable clothing and clothing appropriate for easy access to any Portacath or PICC line.   We strive to give you quality time with your provider. You may need to reschedule your appointment if you arrive late (15 or more minutes).  Arriving late affects you and other patients whose appointments are after yours.  Also, if you miss three or more appointments without notifying the office, you may be dismissed from the clinic at the provider's discretion.      For prescription refill requests, have your pharmacy contact our office and allow 72 hours for refills to be completed.    Today you received the following :  Retacrit.  Epoetin Alfa Injection What is this medication? EPOETIN ALFA (e POE e tin AL fa) treats low levels of red blood cells (anemia) caused by kidney disease, chemotherapy, or HIV medications. It can also be used in people who are at risk for blood loss during surgery. It works by Systems analyst make more red blood cells, which reduces the need for blood transfusions. This medicine may be used for other purposes; ask your health care provider or pharmacist if you have questions. COMMON BRAND NAME(S): Epogen, Procrit, Retacrit What should I tell my care team before I take this medication? They need to know if you have any of these conditions: Blood clots Cancer Heart disease High blood pressure On dialysis Seizures Stroke An unusual or allergic reaction to epoetin alfa, albumin, benzyl alcohol, other  medications, foods, dyes, or preservatives Pregnant or trying to get pregnant Breast-feeding How should I use this medication? This medication is injected into a vein or under the skin. It is usually given by your care team in a hospital or clinic setting. It may also be given at home. If you get this medication at home, you will be taught how to prepare and give it. Use exactly as directed. Take it as directed on the prescription label at the same time every day. Keep taking it unless your care team tells you to stop. It is important that you put your used needles and syringes in a special sharps container. Do not put them in a trash can. If you do not have a sharps container, call your pharmacist or care team to get one. A special MedGuide will be given to you by the pharmacist with each prescription and refill. Be sure to read this information carefully each time. Talk to your care team about the use of this medication in children. While this medication may be used in children as young as 1 month of age for selected conditions, precautions do apply. Overdosage: If you think you have taken too much of this medicine contact a poison control center or emergency room at once. NOTE: This medicine is only for you. Do not share this medicine with others. What if I miss a dose? If you miss a dose, take it as soon as you can. If it is almost time for your  next dose, take only that dose. Do not take double or extra doses. What may interact with this medication? Darbepoetin alfa Methoxy polyethylene glycol-epoetin beta This list may not describe all possible interactions. Give your health care provider a list of all the medicines, herbs, non-prescription drugs, or dietary supplements you use. Also tell them if you smoke, drink alcohol, or use illegal drugs. Some items may interact with your medicine. What should I watch for while using this medication? Visit your care team for regular checks on your  progress. Check your blood pressure as directed. Know what your blood pressure should be and when to contact your care team. Your condition will be monitored carefully while you are receiving this medication. You may need blood work while taking this medication. What side effects may I notice from receiving this medication? Side effects that you should report to your care team as soon as possible: Allergic reactions--skin rash, itching, hives, swelling of the face, lips, tongue, or throat Blood clot--pain, swelling, or warmth in the leg, shortness of breath, chest pain Heart attack--pain or tightness in the chest, shoulders, arms, or jaw, nausea, shortness of breath, cold or clammy skin, feeling faint or lightheaded Increase in blood pressure Rash, fever, and swollen lymph nodes Redness, blistering, peeling, or loosening of the skin, including inside the mouth Seizures Stroke--sudden numbness or weakness of the face, arm, or leg, trouble speaking, confusion, trouble walking, loss of balance or coordination, dizziness, severe headache, change in vision Side effects that usually do not require medical attention (report to your care team if they continue or are bothersome): Bone, joint, or muscle pain Cough Headache Nausea Pain, redness, or irritation at injection site This list may not describe all possible side effects. Call your doctor for medical advice about side effects. You may report side effects to FDA at 1-800-FDA-1088. Where should I keep my medication? Keep out of the reach of children and pets. Store in a refrigerator. Do not freeze. Do not shake. Protect from light. Keep this medication in the original container until you are ready to take it. See product for storage information. Get rid of any unused medication after the expiration date. To get rid of medications that are no longer needed or have expired: Take the medication to a medication take-back program. Check with your  pharmacy or law enforcement to find a location. If you cannot return the medication, ask your pharmacist or care team how to get rid of the medication safely. NOTE: This sheet is a summary. It may not cover all possible information. If you have questions about this medicine, talk to your doctor, pharmacist, or health care provider.  2024 Elsevier/Gold Standard (2021-12-28 00:00:00)    To help prevent nausea and vomiting after your treatment, we encourage you to take your nausea medication as directed.  BELOW ARE SYMPTOMS THAT SHOULD BE REPORTED IMMEDIATELY: *FEVER GREATER THAN 100.4 F (38 C) OR HIGHER *CHILLS OR SWEATING *NAUSEA AND VOMITING THAT IS NOT CONTROLLED WITH YOUR NAUSEA MEDICATION *UNUSUAL SHORTNESS OF BREATH *UNUSUAL BRUISING OR BLEEDING *URINARY PROBLEMS (pain or burning when urinating, or frequent urination) *BOWEL PROBLEMS (unusual diarrhea, constipation, pain near the anus) TENDERNESS IN MOUTH AND THROAT WITH OR WITHOUT PRESENCE OF ULCERS (sore throat, sores in mouth, or a toothache) UNUSUAL RASH, SWELLING OR PAIN  UNUSUAL VAGINAL DISCHARGE OR ITCHING   Items with * indicate a potential emergency and should be followed up as soon as possible or go to the Emergency Department if any problems should  occur.  Please show the CHEMOTHERAPY ALERT CARD or IMMUNOTHERAPY ALERT CARD at check-in to the Emergency Department and triage nurse.  Should you have questions after your visit or need to cancel or reschedule your appointment, please contact [facility] [place] - A DEPT OF [facility] [phone]  and follow the prompts.  Office hours are 8:00 a.m. to 4:30 p.m. Monday - Friday. Please note that voicemails left after 4:00 p.m. may not be returned until the following business day.  We are closed weekends and major holidays. You have access to a nurse at all times for urgent questions. Please call the main number to the clinic [phone] and follow the  prompts.  For any non-urgent questions, you may also contact your provider using MyChart. We now offer e-Visits for anyone 73 and older to request care online for non-urgent symptoms. For details visit [email or web address].   Also download the MyChart app! Go to the app store, search "MyChart", open the app, select [place], and log in with your MyChart username and password.

## 2023-12-10 ENCOUNTER — Telehealth: Payer: Self-pay | Admitting: Family Medicine

## 2023-12-12 DIAGNOSIS — D631 Anemia in chronic kidney disease: Secondary | ICD-10-CM | POA: Diagnosis not present

## 2023-12-12 DIAGNOSIS — E11319 Type 2 diabetes mellitus with unspecified diabetic retinopathy without macular edema: Secondary | ICD-10-CM | POA: Diagnosis not present

## 2023-12-12 DIAGNOSIS — N1832 Chronic kidney disease, stage 3b: Secondary | ICD-10-CM | POA: Diagnosis not present

## 2023-12-12 DIAGNOSIS — I5042 Chronic combined systolic (congestive) and diastolic (congestive) heart failure: Secondary | ICD-10-CM | POA: Diagnosis not present

## 2023-12-22 ENCOUNTER — Inpatient Hospital Stay: Attending: Hematology

## 2023-12-22 ENCOUNTER — Inpatient Hospital Stay

## 2023-12-22 VITALS — BP 174/77 | HR 79 | Temp 98.0°F | Resp 18

## 2023-12-22 DIAGNOSIS — D631 Anemia in chronic kidney disease: Secondary | ICD-10-CM | POA: Insufficient documentation

## 2023-12-22 DIAGNOSIS — N184 Chronic kidney disease, stage 4 (severe): Secondary | ICD-10-CM | POA: Insufficient documentation

## 2023-12-22 LAB — CBC
HCT: 29.4 % — ABNORMAL LOW (ref 36.0–46.0)
Hemoglobin: 9.1 g/dL — ABNORMAL LOW (ref 12.0–15.0)
MCH: 29.4 pg (ref 26.0–34.0)
MCHC: 31 g/dL (ref 30.0–36.0)
MCV: 95.1 fL (ref 80.0–100.0)
Platelets: 235 10*3/uL (ref 150–400)
RBC: 3.09 MIL/uL — ABNORMAL LOW (ref 3.87–5.11)
RDW: 17.2 % — ABNORMAL HIGH (ref 11.5–15.5)
WBC: 7.7 10*3/uL (ref 4.0–10.5)
nRBC: 0 % (ref 0.0–0.2)

## 2023-12-22 MED ORDER — EPOETIN ALFA-EPBX 40000 UNIT/ML IJ SOLN: 20000.0000 [IU] | Freq: Once | INTRAMUSCULAR | Status: DC

## 2023-12-22 MED ORDER — EPOETIN ALFA-EPBX 10000 UNIT/ML IJ SOLN
20000.0000 [IU] | Freq: Once | INTRAMUSCULAR | Status: AC
  Administered 2023-12-22: 20000 [IU] via SUBCUTANEOUS
  Filled 2023-12-22: qty 2

## 2023-12-22 NOTE — Progress Notes (Signed)
 Hemoglobin is 9.1.  We will proceed with Retacrit per provider orders. BP elevated at the last two injection appts.  Patient states that nephrology is aware.  Medications are being adjusted.  She follows up with  nephrology next week.    Patient tolerated injection with no complaints voiced.  Site clean and dry with no bruising or swelling noted.  No complaints of pain.  Discharged with vital signs stable and no signs or symptoms of distress noted.

## 2023-12-22 NOTE — Patient Instructions (Signed)
 [facility] [place] - A DEPT OF [facility]  Discharge Instructions: Thank you for choosing [facility] to provide your oncology and hematology care.  If you have a lab appointment with the Cancer Center - please note that after April 8th, 2024, all labs will be drawn in the cancer center.  You do not have to check in or register with the main entrance as you have in the past but will complete your check-in in the cancer center.  Wear comfortable clothing and clothing appropriate for easy access to any Portacath or PICC line.   We strive to give you quality time with your provider. You may need to reschedule your appointment if you arrive late (15 or more minutes).  Arriving late affects you and other patients whose appointments are after yours.  Also, if you miss three or more appointments without notifying the office, you may be dismissed from the clinic at the provider's discretion.      For prescription refill requests, have your pharmacy contact our office and allow 72 hours for refills to be completed.    Today you received the following :  Retacrit.  Epoetin Alfa Injection What is this medication? EPOETIN ALFA (e POE e tin AL fa) treats low levels of red blood cells (anemia) caused by kidney disease, chemotherapy, or HIV medications. It can also be used in people who are at risk for blood loss during surgery. It works by Systems analyst make more red blood cells, which reduces the need for blood transfusions. This medicine may be used for other purposes; ask your health care provider or pharmacist if you have questions. COMMON BRAND NAME(S): Epogen, Procrit, Retacrit What should I tell my care team before I take this medication? They need to know if you have any of these conditions: Blood clots Cancer Heart disease High blood pressure On dialysis Seizures Stroke An unusual or allergic reaction to epoetin alfa, albumin, benzyl alcohol, other  medications, foods, dyes, or preservatives Pregnant or trying to get pregnant Breast-feeding How should I use this medication? This medication is injected into a vein or under the skin. It is usually given by your care team in a hospital or clinic setting. It may also be given at home. If you get this medication at home, you will be taught how to prepare and give it. Use exactly as directed. Take it as directed on the prescription label at the same time every day. Keep taking it unless your care team tells you to stop. It is important that you put your used needles and syringes in a special sharps container. Do not put them in a trash can. If you do not have a sharps container, call your pharmacist or care team to get one. A special MedGuide will be given to you by the pharmacist with each prescription and refill. Be sure to read this information carefully each time. Talk to your care team about the use of this medication in children. While this medication may be used in children as young as 1 month of age for selected conditions, precautions do apply. Overdosage: If you think you have taken too much of this medicine contact a poison control center or emergency room at once. NOTE: This medicine is only for you. Do not share this medicine with others. What if I miss a dose? If you miss a dose, take it as soon as you can. If it is almost time for your  next dose, take only that dose. Do not take double or extra doses. What may interact with this medication? Darbepoetin alfa Methoxy polyethylene glycol-epoetin beta This list may not describe all possible interactions. Give your health care provider a list of all the medicines, herbs, non-prescription drugs, or dietary supplements you use. Also tell them if you smoke, drink alcohol, or use illegal drugs. Some items may interact with your medicine. What should I watch for while using this medication? Visit your care team for regular checks on your  progress. Check your blood pressure as directed. Know what your blood pressure should be and when to contact your care team. Your condition will be monitored carefully while you are receiving this medication. You may need blood work while taking this medication. What side effects may I notice from receiving this medication? Side effects that you should report to your care team as soon as possible: Allergic reactions--skin rash, itching, hives, swelling of the face, lips, tongue, or throat Blood clot--pain, swelling, or warmth in the leg, shortness of breath, chest pain Heart attack--pain or tightness in the chest, shoulders, arms, or jaw, nausea, shortness of breath, cold or clammy skin, feeling faint or lightheaded Increase in blood pressure Rash, fever, and swollen lymph nodes Redness, blistering, peeling, or loosening of the skin, including inside the mouth Seizures Stroke--sudden numbness or weakness of the face, arm, or leg, trouble speaking, confusion, trouble walking, loss of balance or coordination, dizziness, severe headache, change in vision Side effects that usually do not require medical attention (report to your care team if they continue or are bothersome): Bone, joint, or muscle pain Cough Headache Nausea Pain, redness, or irritation at injection site This list may not describe all possible side effects. Call your doctor for medical advice about side effects. You may report side effects to FDA at 1-800-FDA-1088. Where should I keep my medication? Keep out of the reach of children and pets. Store in a refrigerator. Do not freeze. Do not shake. Protect from light. Keep this medication in the original container until you are ready to take it. See product for storage information. Get rid of any unused medication after the expiration date. To get rid of medications that are no longer needed or have expired: Take the medication to a medication take-back program. Check with your  pharmacy or law enforcement to find a location. If you cannot return the medication, ask your pharmacist or care team how to get rid of the medication safely. NOTE: This sheet is a summary. It may not cover all possible information. If you have questions about this medicine, talk to your doctor, pharmacist, or health care provider.  2024 Elsevier/Gold Standard (2021-12-28 00:00:00)    To help prevent nausea and vomiting after your treatment, we encourage you to take your nausea medication as directed.  BELOW ARE SYMPTOMS THAT SHOULD BE REPORTED IMMEDIATELY: *FEVER GREATER THAN 100.4 F (38 C) OR HIGHER *CHILLS OR SWEATING *NAUSEA AND VOMITING THAT IS NOT CONTROLLED WITH YOUR NAUSEA MEDICATION *UNUSUAL SHORTNESS OF BREATH *UNUSUAL BRUISING OR BLEEDING *URINARY PROBLEMS (pain or burning when urinating, or frequent urination) *BOWEL PROBLEMS (unusual diarrhea, constipation, pain near the anus) TENDERNESS IN MOUTH AND THROAT WITH OR WITHOUT PRESENCE OF ULCERS (sore throat, sores in mouth, or a toothache) UNUSUAL RASH, SWELLING OR PAIN  UNUSUAL VAGINAL DISCHARGE OR ITCHING   Items with * indicate a potential emergency and should be followed up as soon as possible or go to the Emergency Department if any problems should  occur.  Please show the CHEMOTHERAPY ALERT CARD or IMMUNOTHERAPY ALERT CARD at check-in to the Emergency Department and triage nurse.  Should you have questions after your visit or need to cancel or reschedule your appointment, please contact [facility] [place] - A DEPT OF [facility] [phone]  and follow the prompts.  Office hours are 8:00 a.m. to 4:30 p.m. Monday - Friday. Please note that voicemails left after 4:00 p.m. may not be returned until the following business day.  We are closed weekends and major holidays. You have access to a nurse at all times for urgent questions. Please call the main number to the clinic [phone] and follow the  prompts.  For any non-urgent questions, you may also contact your provider using MyChart. We now offer e-Visits for anyone 73 and older to request care online for non-urgent symptoms. For details visit [email or web address].   Also download the MyChart app! Go to the app store, search "MyChart", open the app, select [place], and log in with your MyChart username and password.

## 2023-12-24 DIAGNOSIS — D631 Anemia in chronic kidney disease: Secondary | ICD-10-CM | POA: Diagnosis not present

## 2023-12-24 DIAGNOSIS — R809 Proteinuria, unspecified: Secondary | ICD-10-CM | POA: Diagnosis not present

## 2023-12-24 DIAGNOSIS — N189 Chronic kidney disease, unspecified: Secondary | ICD-10-CM | POA: Diagnosis not present

## 2023-12-24 DIAGNOSIS — E211 Secondary hyperparathyroidism, not elsewhere classified: Secondary | ICD-10-CM | POA: Diagnosis not present

## 2024-01-02 DIAGNOSIS — E1042 Type 1 diabetes mellitus with diabetic polyneuropathy: Secondary | ICD-10-CM | POA: Diagnosis not present

## 2024-01-02 DIAGNOSIS — Z794 Long term (current) use of insulin: Secondary | ICD-10-CM | POA: Diagnosis not present

## 2024-01-02 DIAGNOSIS — E039 Hypothyroidism, unspecified: Secondary | ICD-10-CM | POA: Diagnosis not present

## 2024-01-02 DIAGNOSIS — E1065 Type 1 diabetes mellitus with hyperglycemia: Secondary | ICD-10-CM | POA: Diagnosis not present

## 2024-01-05 ENCOUNTER — Inpatient Hospital Stay

## 2024-01-05 ENCOUNTER — Inpatient Hospital Stay: Admitting: Oncology

## 2024-01-06 ENCOUNTER — Inpatient Hospital Stay: Admitting: Oncology

## 2024-01-06 ENCOUNTER — Inpatient Hospital Stay

## 2024-01-07 ENCOUNTER — Inpatient Hospital Stay: Admitting: Oncology

## 2024-01-07 ENCOUNTER — Inpatient Hospital Stay

## 2024-01-08 ENCOUNTER — Inpatient Hospital Stay

## 2024-01-08 ENCOUNTER — Inpatient Hospital Stay: Attending: Hematology

## 2024-01-08 ENCOUNTER — Inpatient Hospital Stay: Admitting: Oncology

## 2024-01-08 VITALS — BP 146/55 | HR 65 | Temp 97.7°F | Resp 20

## 2024-01-08 DIAGNOSIS — D631 Anemia in chronic kidney disease: Secondary | ICD-10-CM | POA: Diagnosis present

## 2024-01-08 DIAGNOSIS — N184 Chronic kidney disease, stage 4 (severe): Secondary | ICD-10-CM | POA: Insufficient documentation

## 2024-01-08 LAB — CBC
HCT: 28.7 % — ABNORMAL LOW (ref 36.0–46.0)
Hemoglobin: 9.6 g/dL — ABNORMAL LOW (ref 12.0–15.0)
MCH: 30.2 pg (ref 26.0–34.0)
MCHC: 33.4 g/dL (ref 30.0–36.0)
MCV: 90.3 fL (ref 80.0–100.0)
Platelets: 277 10*3/uL (ref 150–400)
RBC: 3.18 MIL/uL — ABNORMAL LOW (ref 3.87–5.11)
RDW: 17.3 % — ABNORMAL HIGH (ref 11.5–15.5)
WBC: 7.5 10*3/uL (ref 4.0–10.5)
nRBC: 0 % (ref 0.0–0.2)

## 2024-01-08 MED ORDER — EPOETIN ALFA-EPBX 10000 UNIT/ML IJ SOLN
20000.0000 [IU] | Freq: Once | INTRAMUSCULAR | Status: AC
  Administered 2024-01-08: 20000 [IU] via SUBCUTANEOUS
  Filled 2024-01-08: qty 2

## 2024-01-08 MED ORDER — EPOETIN ALFA-EPBX 40000 UNIT/ML IJ SOLN: 20000.0000 [IU] | Freq: Once | INTRAMUSCULAR | Status: DC

## 2024-01-08 NOTE — Assessment & Plan Note (Signed)
 Patient has a history of CKD being followed by Dr. Wolfgang Phoenix -Continue to follow with nephrology

## 2024-01-08 NOTE — Patient Instructions (Signed)
 [facility] [place] - A DEPT OF [facility]  Discharge Instructions: Thank you for choosing [facility] to provide your oncology and hematology care.  If you have a lab appointment with the Cancer Center - please note that after April 8th, 2024, all labs will be drawn in the cancer center.  You do not have to check in or register with the main entrance as you have in the past but will complete your check-in in the cancer center.  Wear comfortable clothing and clothing appropriate for easy access to any Portacath or PICC line.   We strive to give you quality time with your provider. You may need to reschedule your appointment if you arrive late (15 or more minutes).  Arriving late affects you and other patients whose appointments are after yours.  Also, if you miss three or more appointments without notifying the office, you may be dismissed from the clinic at the provider's discretion.      For prescription refill requests, have your pharmacy contact our office and allow 72 hours for refills to be completed.    Today you received Retacrit 20,000U injection     BELOW ARE SYMPTOMS THAT SHOULD BE REPORTED IMMEDIATELY: *FEVER GREATER THAN 100.4 F (38 C) OR HIGHER *CHILLS OR SWEATING *NAUSEA AND VOMITING THAT IS NOT CONTROLLED WITH YOUR NAUSEA MEDICATION *UNUSUAL SHORTNESS OF BREATH *UNUSUAL BRUISING OR BLEEDING *URINARY PROBLEMS (pain or burning when urinating, or frequent urination) *BOWEL PROBLEMS (unusual diarrhea, constipation, pain near the anus) TENDERNESS IN MOUTH AND THROAT WITH OR WITHOUT PRESENCE OF ULCERS (sore throat, sores in mouth, or a toothache) UNUSUAL RASH, SWELLING OR PAIN  UNUSUAL VAGINAL DISCHARGE OR ITCHING   Items with * indicate a potential emergency and should be followed up as soon as possible or go to the Emergency Department if any problems should occur.  Please show the CHEMOTHERAPY ALERT CARD or IMMUNOTHERAPY ALERT CARD at  check-in to the Emergency Department and triage nurse.  Should you have questions after your visit or need to cancel or reschedule your appointment, please contact [facility] [place] - A DEPT OF [facility] [phone]  and follow the prompts.  Office hours are 8:00 a.m. to 4:30 p.m. Monday - Friday. Please note that voicemails left after 4:00 p.m. may not be returned until the following business day.  We are closed weekends and major holidays. You have access to a nurse at all times for urgent questions. Please call the main number to the clinic [phone] and follow the prompts.  For any non-urgent questions, you may also contact your provider using MyChart. We now offer e-Visits for anyone 34 and older to request care online for non-urgent symptoms. For details visit [email or web address].   Also download the MyChart app! Go to the app store, search "MyChart", open the app, select [facility], and log in with your MyChart username and password.

## 2024-01-08 NOTE — Progress Notes (Signed)
 Bridget Martinez presents today for injection per the provider's orders. Retacrit  20,000U administration without incident; injection site WNL; see MAR for injection details.  Patient tolerated procedure well and without incident.  No questions or complaints noted at this time. Patient's hemoglobin noted to be 9.6 today.  Discharged from clinic via wheelchair in stable condition. Alert and oriented x 3. F/U with [facility] as scheduled.

## 2024-01-08 NOTE — Progress Notes (Signed)
 [facility] at [facility]  HEMATOLOGY FOLLOW-UP VISIT  Martinez, Bridget Kindred, FNP  REASON FOR FOLLOW-UP: Anemia of chronic kidney disease  ASSESSMENT & PLAN:  Patient is a 73 year old female with chronic kidney disease following for anemia of chronic kidney disease.  Currently on EPO shots   Anemia in chronic kidney disease Patient has stable hemoglobin and has been receiving EPO shots.  Iron labs did not show evidence of iron deficiency.  Patient did not have significant in hemoglobin since the start of erythropoietin shots.   SPEP negative for M spike.  Free light chain slightly elevated but normal ratio Increased ESA dosing from 10,000 units every 2 weeks to 20,000 units every  -Continue ESA every 2 weeks - Monitor hemoglobin levels.  Return to clinic in 2 month with labs.  CKD (chronic kidney disease) stage 4, GFR 15-29 ml/min (HCC) Patient has a history of CKD being followed by Dr. Carrolyn Clan  -Continue to follow with nephrology    Orders Placed This Encounter  Procedures   Ferritin    Standing Status:   Future    Expected Date:   03/08/2024    Expiration Date:   01/07/2025   Folate    Standing Status:   Future    Expected Date:   03/08/2024    Expiration Date:   01/07/2025   Vitamin B12    Standing Status:   Future    Expected Date:   03/08/2024    Expiration Date:   01/07/2025   CBC with Differential/Platelet    Standing Status:   Future    Expected Date:   03/08/2024    Expiration Date:   01/07/2025   Comprehensive metabolic panel with GFR    Standing Status:   Future    Expected Date:   03/08/2024    Expiration Date:   01/07/2025   Iron and TIBC    Standing Status:   Future    Expected Date:   03/08/2024    Expiration Date:   01/07/2025    The total time spent in the appointment was 20 minutes encounter with patients including review of chart and various tests results, discussions about plan of care and coordination of care plan   All questions were  answered. The patient knows to call the clinic with any problems, questions or concerns. No barriers to learning was detected.  Eduardo Grade, MD 5/1/20253:59 PM    INTERVAL HISTORY: Bridget Martinez 73 y.o. female being followed for anemia of chronic kidney disease.  She is accompanied by her daughter in law today.she reports feeling good overall.  The patient also reports improved blood pressure control, under the care of her cardiologist.  She is seeing her nephrologist on a regular basis.   I have reviewed the past medical history, past surgical history, social history and family history with the patient   ALLERGIES:  is allergic to penicillins, sulfa antibiotics, ciprofloxacin , macrobid  [nitrofurantoin  monohyd macro], other, and ciprofloxacin  hcl.  MEDICATIONS:  Current Outpatient Medications  Medication Sig Dispense Refill   aspirin  81 MG EC tablet Take 1 tablet (81 mg total) by mouth daily. 30 tablet 3   buPROPion  (WELLBUTRIN  XL) 150 MG 24 hr tablet Take 1 tablet (150 mg total) by mouth 2 (two) times daily. 180 tablet 1   carvedilol  (COREG ) 12.5 MG tablet TAKE 1 TABLET (12.5MG  TOTAL) BY MOUTH TWICE A DAY WITH MEALS 60 tablet 10   Continuous Blood Gluc Receiver (DEXCOM G6 RECEIVER) DEVI See  admin instructions.     Continuous Blood Gluc Sensor (DEXCOM G6 SENSOR) MISC See admin instructions.     epoetin  alfa (EPOGEN ) 3000 UNIT/ML injection Inject into the skin.     ferrous sulfate  325 (65 FE) MG EC tablet Take 1 tablet (325 mg total) by mouth every other day. 45 tablet 3   furosemide  (LASIX ) 20 MG tablet Take 20 mg by mouth 3 (three) times a week.     insulin  aspart (NOVOLOG  FLEXPEN) 100 UNIT/ML FlexPen 13 to 17 units at breakfast, 7 to 11 units at lunch and at supper     insulin  glargine (LANTUS ) 100 UNIT/ML injection Inject into the skin. 18 units AM & 12 units PM     LANTUS  SOLOSTAR 100 UNIT/ML Solostar Pen INJECT 20 UNITS EACH MORNING AND 20 UNITS EACH EVENING SUBCUTANEOUS  TWICE A DAY 30 DAYS 37     levothyroxine  (SYNTHROID ) 75 MCG tablet 1 tablet on an empty stomach every morning Orally Once a day for 30 days     metoCLOPramide  (REGLAN ) 5 MG tablet Take 1 tablet (5 mg total) by mouth 3 (three) times daily before meals. 270 tablet 0   mirtazapine  (REMERON ) 15 MG tablet Take 1 tablet (15 mg total) by mouth at bedtime. 90 tablet 1   ondansetron  (ZOFRAN ) 4 MG tablet Take 4 mg by mouth every 8 (eight) hours as needed.     pantoprazole  (PROTONIX ) 40 MG tablet Take 1 tablet (40 mg total) by mouth daily. 90 tablet 1   potassium chloride  (KLOR-CON ) 20 MEQ packet Take by mouth 2 (two) times daily.     rosuvastatin  (CRESTOR ) 40 MG tablet Take 1 tablet (40 mg total) by mouth daily. 90 tablet 1   sodium bicarbonate  650 MG tablet Take 650 mg by mouth 3 (three) times daily.     No current facility-administered medications for this visit.     REVIEW OF SYSTEMS:   Constitutional: Denies fevers, chills or night sweats Eyes: Denies blurriness of vision Ears, nose, mouth, throat, and face: Denies mucositis or sore throat Respiratory: Denies cough, dyspnea or wheezes Cardiovascular: Denies palpitation, chest discomfort or lower extremity swelling Gastrointestinal:  Denies nausea, heartburn or change in bowel habits Skin: Denies abnormal skin rashes Lymphatics: Denies new lymphadenopathy or easy bruising Neurological:Denies numbness, tingling or new weaknesses Behavioral/Psych: Mood is stable, no new changes  All other systems were reviewed with the patient and are negative.  PHYSICAL EXAMINATION:   Vitals:   01/08/24 1424 01/08/24 1427  BP: (!) 144/57 (!) 146/55  Pulse: 65   Resp: 20   Temp: 97.7 F (36.5 C)   SpO2: 98%     GENERAL:alert, no distress and comfortable SKIN: skin color, texture, turgor are normal, no rashes or significant lesions LUNGS: clear to auscultation and percussion with normal breathing effort HEART: regular rate & rhythm and no murmurs and  no lower extremity edema ABDOMEN:abdomen soft, non-tender and normal bowel sounds Musculoskeletal:no cyanosis of digits and no clubbing  NEURO: alert & oriented x 3 with fluent speech  LABORATORY DATA:  I have reviewed the data as listed  Lab Results  Component Value Date   WBC 7.5 01/08/2024   NEUTROABS 5.1 12/08/2023   HGB 9.6 (L) 01/08/2024   HCT 28.7 (L) 01/08/2024   MCV 90.3 01/08/2024   PLT 277 01/08/2024       Chemistry      Component Value Date/Time   NA 134 (L) 12/08/2023 1258   NA 139 11/21/2023 0927  K 5.0 12/08/2023 1258   CL 101 12/08/2023 1258   CO2 21 (L) 12/08/2023 1258   BUN 43 (H) 12/08/2023 1258   BUN 32 (H) 11/21/2023 0927   CREATININE 2.29 (H) 12/08/2023 1258   GLU 416 01/18/2022 0000      Component Value Date/Time   CALCIUM  9.3 12/08/2023 1258   ALKPHOS 74 12/08/2023 1258   AST 32 12/08/2023 1258   ALT 31 12/08/2023 1258   BILITOT 0.6 12/08/2023 1258   BILITOT 0.4 11/21/2023 0927      Latest Reference Range & Units 10/22/23 14:10  Total Protein ELP 6.0 - 8.5 g/dL 6.4 (C)  Albumin  SerPl Elph-Mcnc 2.9 - 4.4 g/dL 3.6 (C)  Albumin /Glob SerPl 0.7 - 1.7  1.3 (C)  Alpha2 Glob SerPl Elph-Mcnc 0.4 - 1.0 g/dL 0.7 (C)  Alpha 1 0.0 - 0.4 g/dL 0.2 (C)  Gamma Glob SerPl Elph-Mcnc 0.4 - 1.8 g/dL 1.0 (C)  M Protein SerPl Elph-Mcnc Not Observed g/dL Not Observed (C)  IFE 1  Comment (C)  Globulin, Total 2.2 - 3.9 g/dL 2.8 (C)  B-Globulin SerPl Elph-Mcnc 0.7 - 1.3 g/dL 0.9 (C)  IgG (Immunoglobin G), Serum 586 - 1,602 mg/dL 1,610  IgM (Immunoglobulin M), Srm 26 - 217 mg/dL 88  IgA 64 - 960 mg/dL 454  (C): Corrected   Latest Reference Range & Units 10/24/23 10:05  Iron 28 - 170 ug/dL 72  UIBC ug/dL 098  TIBC 119 - 147 ug/dL 829  Saturation Ratios 10.4 - 31.8 % 28  Ferritin 11 - 307 ng/mL 556 (H)  Vitamin B12 180 - 914 pg/mL 623  (H): Data is abnormally high   Latest Reference Range & Units 10/24/23 10:06  Folate >5.9 ng/mL 8.0

## 2024-01-08 NOTE — Assessment & Plan Note (Signed)
 Patient has stable hemoglobin and has been receiving EPO shots.  Iron labs did not show evidence of iron deficiency.  Patient did not have significant in hemoglobin since the start of erythropoietin shots.   SPEP negative for M spike.  Free light chain slightly elevated but normal ratio Increased ESA dosing from 10,000 units every 2 weeks to 20,000 units every  -Continue ESA every 2 weeks - Monitor hemoglobin levels.  Return to clinic in 2 month with labs.

## 2024-01-08 NOTE — Patient Instructions (Signed)
 VISIT SUMMARY:  Today, you came in for a follow-up appointment to manage your anemia, which is related to your chronic kidney disease. You were accompanied by your daughter-in-law. Your hemoglobin levels have been slowly improving with your current treatment, and you reported no new symptoms or complaints.  YOUR PLAN:  -ANEMIA IN CHRONIC KIDNEY DISEASE: Anemia is a condition where you don't have enough red blood cells to carry oxygen throughout your body. This is often seen in people with chronic kidney disease. Your hemoglobin level has improved to 9.6 g/dL with your current treatment of biweekly ESA injections. We will continue with the same treatment plan, monitor your hemoglobin levels before each injection, and order iron studies at your next visit. If your hemoglobin does not improve, we may consider increasing your ESA dose.  -CHRONIC KIDNEY DISEASE, STAGE 4: Chronic kidney disease is a condition where your kidneys gradually lose their ability to function properly. You are currently at stage 4, which means your kidney function is significantly reduced. Your GFR was 22 mL/min/1.73 m, and you are under the care of your nephrologist, Dr. Carrolyn Clan. Please continue your follow-ups with Dr. Carrolyn Clan and monitor for any symptoms like fatigue or changes in your condition, and report them if they occur.  INSTRUCTIONS:  Please continue with your biweekly ESA injections and follow up with your nephrologist, Dr. [place], as scheduled. We will monitor your hemoglobin levels before each ESA injection and order iron studies at your next visit. If you notice any new symptoms or changes in your condition, please report them immediately.

## 2024-01-22 ENCOUNTER — Inpatient Hospital Stay

## 2024-01-22 VITALS — BP 139/50 | HR 65 | Temp 97.1°F | Resp 17

## 2024-01-22 DIAGNOSIS — N184 Chronic kidney disease, stage 4 (severe): Secondary | ICD-10-CM

## 2024-01-22 DIAGNOSIS — D631 Anemia in chronic kidney disease: Secondary | ICD-10-CM

## 2024-01-22 LAB — CBC
HCT: 30.6 % — ABNORMAL LOW (ref 36.0–46.0)
Hemoglobin: 10.2 g/dL — ABNORMAL LOW (ref 12.0–15.0)
MCH: 29.5 pg (ref 26.0–34.0)
MCHC: 33.3 g/dL (ref 30.0–36.0)
MCV: 88.4 fL (ref 80.0–100.0)
Platelets: 235 10*3/uL (ref 150–400)
RBC: 3.46 MIL/uL — ABNORMAL LOW (ref 3.87–5.11)
RDW: 17.6 % — ABNORMAL HIGH (ref 11.5–15.5)
WBC: 7.3 10*3/uL (ref 4.0–10.5)
nRBC: 0 % (ref 0.0–0.2)

## 2024-01-22 MED ORDER — EPOETIN ALFA-EPBX 40000 UNIT/ML IJ SOLN: 20000.0000 [IU] | Freq: Once | INTRAMUSCULAR | Status: DC

## 2024-01-22 MED ORDER — EPOETIN ALFA-EPBX 10000 UNIT/ML IJ SOLN
20000.0000 [IU] | Freq: Once | INTRAMUSCULAR | Status: AC
Start: 2024-01-22 — End: 2024-01-22
  Administered 2024-01-22: 20000 [IU] via SUBCUTANEOUS
  Filled 2024-01-22: qty 2

## 2024-01-22 NOTE — Progress Notes (Signed)
 Bridget Martinez presents today for injection per the provider's orders. HGB  10.2 administration without incident; injection site WNL; see MAR for injection details.  Patient tolerated procedure well and without incident.  No questions or complaints noted at this time.  Discharged from clinic by wheel chair in stable condition. Alert and oriented x 3. F/U with [facility] as scheduled.

## 2024-01-22 NOTE — Patient Instructions (Signed)
 [facility] [place] - A DEPT OF [facility]  Discharge Instructions: Thank you for choosing  Cancer Center to provide your oncology and hematology care.  If you have a lab appointment with the Cancer Center - please note that after April 8th, 2024, all labs will be drawn in the cancer center.  You do not have to check in or register with the main entrance as you have in the past but will complete your check-in in the cancer center.  Wear comfortable clothing and clothing appropriate for easy access to any Portacath or PICC line.   We strive to give you quality time with your provider. You may need to reschedule your appointment if you arrive late (15 or more minutes).  Arriving late affects you and other patients whose appointments are after yours.  Also, if you miss three or more appointments without notifying the office, you may be dismissed from the clinic at the provider's discretion.      For prescription refill requests, have your pharmacy contact our office and allow 72 hours for refills to be completed.    Today you received the following chemotherapy and/or immunotherapy agents Retacrit. Epoetin Alfa Injection What is this medication? EPOETIN ALFA (e POE e tin AL fa) treats low levels of red blood cells (anemia) caused by kidney disease, chemotherapy, or HIV medications. It can also be used in people who are at risk for blood loss during surgery. It works by Systems analyst make more red blood cells, which reduces the need for blood transfusions. This medicine may be used for other purposes; ask your health care provider or pharmacist if you have questions. COMMON BRAND NAME(S): Epogen, Procrit, Retacrit What should I tell my care team before I take this medication? They need to know if you have any of these conditions: Blood clots Cancer Heart disease High blood pressure On dialysis Seizures Stroke An unusual or allergic reaction to epoetin  alfa, albumin, benzyl alcohol, other medications, foods, dyes, or preservatives Pregnant or trying to get pregnant Breast-feeding How should I use this medication? This medication is injected into a vein or under the skin. It is usually given by your care team in a hospital or clinic setting. It may also be given at home. If you get this medication at home, you will be taught how to prepare and give it. Use exactly as directed. Take it as directed on the prescription label at the same time every day. Keep taking it unless your care team tells you to stop. It is important that you put your used needles and syringes in a special sharps container. Do not put them in a trash can. If you do not have a sharps container, call your pharmacist or care team to get one. A special MedGuide will be given to you by the pharmacist with each prescription and refill. Be sure to read this information carefully each time. Talk to your care team about the use of this medication in children. While this medication may be used in children as young as 1 month of age for selected conditions, precautions do apply. Overdosage: If you think you have taken too much of this medicine contact a poison control center or emergency room at once. NOTE: This medicine is only for you. Do not share this medicine with others. What if I miss a dose? If you miss a dose, take it as soon as you can. If it is almost time for  your next dose, take only that dose. Do not take double or extra doses. What may interact with this medication? Darbepoetin alfa Methoxy polyethylene glycol-epoetin beta This list may not describe all possible interactions. Give your health care provider a list of all the medicines, herbs, non-prescription drugs, or dietary supplements you use. Also tell them if you smoke, drink alcohol, or use illegal drugs. Some items may interact with your medicine. What should I watch for while using this medication? Visit your care  team for regular checks on your progress. Check your blood pressure as directed. Know what your blood pressure should be and when to contact your care team. Your condition will be monitored carefully while you are receiving this medication. You may need blood work while taking this medication. What side effects may I notice from receiving this medication? Side effects that you should report to your care team as soon as possible: Allergic reactions--skin rash, itching, hives, swelling of the face, lips, tongue, or throat Blood clot--pain, swelling, or warmth in the leg, shortness of breath, chest pain Heart attack--pain or tightness in the chest, shoulders, arms, or jaw, nausea, shortness of breath, cold or clammy skin, feeling faint or lightheaded Increase in blood pressure Rash, fever, and swollen lymph nodes Redness, blistering, peeling, or loosening of the skin, including inside the mouth Seizures Stroke--sudden numbness or weakness of the face, arm, or leg, trouble speaking, confusion, trouble walking, loss of balance or coordination, dizziness, severe headache, change in vision Side effects that usually do not require medical attention (report to your care team if they continue or are bothersome): Bone, joint, or muscle pain Cough Headache Nausea Pain, redness, or irritation at injection site This list may not describe all possible side effects. Call your doctor for medical advice about side effects. You may report side effects to FDA at 1-800-FDA-1088. Where should I keep my medication? Keep out of the reach of children and pets. Store in a refrigerator. Do not freeze. Do not shake. Protect from light. Keep this medication in the original container until you are ready to take it. See product for storage information. Get rid of any unused medication after the expiration date. To get rid of medications that are no longer needed or have expired: Take the medication to a medication take-back  program. Check with your pharmacy or law enforcement to find a location. If you cannot return the medication, ask your pharmacist or care team how to get rid of the medication safely. NOTE: This sheet is a summary. It may not cover all possible information. If you have questions about this medicine, talk to your doctor, pharmacist, or health care provider.  2024 Elsevier/Gold Standard (2021-12-28 00:00:00)       To help prevent nausea and vomiting after your treatment, we encourage you to take your nausea medication as directed.  BELOW ARE SYMPTOMS THAT SHOULD BE REPORTED IMMEDIATELY: *FEVER GREATER THAN 100.4 F (38 C) OR HIGHER *CHILLS OR SWEATING *NAUSEA AND VOMITING THAT IS NOT CONTROLLED WITH YOUR NAUSEA MEDICATION *UNUSUAL SHORTNESS OF BREATH *UNUSUAL BRUISING OR BLEEDING *URINARY PROBLEMS (pain or burning when urinating, or frequent urination) *BOWEL PROBLEMS (unusual diarrhea, constipation, pain near the anus) TENDERNESS IN MOUTH AND THROAT WITH OR WITHOUT PRESENCE OF ULCERS (sore throat, sores in mouth, or a toothache) UNUSUAL RASH, SWELLING OR PAIN  UNUSUAL VAGINAL DISCHARGE OR ITCHING   Items with * indicate a potential emergency and should be followed up as soon as possible or go to the Emergency Department  if any problems should occur.  Please show the CHEMOTHERAPY ALERT CARD or IMMUNOTHERAPY ALERT CARD at check-in to the Emergency Department and triage nurse.  Should you have questions after your visit or need to cancel or reschedule your appointment, please contact [facility] [place] - A DEPT OF [facility] [phone]  and follow the prompts.  Office hours are 8:00 a.m. to 4:30 p.m. Monday - Friday. Please note that voicemails left after 4:00 p.m. may not be returned until the following business day.  We are closed weekends and major holidays. You have access to a nurse at all times for urgent questions. Please call the main number to the clinic  [phone] and follow the prompts.  For any non-urgent questions, you may also contact your provider using MyChart. We now offer e-Visits for anyone 88 and older to request care online for non-urgent symptoms. For details visit [email or web address].   Also download the MyChart app! Go to the app store, search "MyChart", open the app, select [place], and log in with your MyChart username and password.

## 2024-01-30 DIAGNOSIS — D631 Anemia in chronic kidney disease: Secondary | ICD-10-CM | POA: Diagnosis not present

## 2024-01-30 DIAGNOSIS — E211 Secondary hyperparathyroidism, not elsewhere classified: Secondary | ICD-10-CM | POA: Diagnosis not present

## 2024-01-30 DIAGNOSIS — R809 Proteinuria, unspecified: Secondary | ICD-10-CM | POA: Diagnosis not present

## 2024-01-30 DIAGNOSIS — N189 Chronic kidney disease, unspecified: Secondary | ICD-10-CM | POA: Diagnosis not present

## 2024-02-02 DIAGNOSIS — N1832 Chronic kidney disease, stage 3b: Secondary | ICD-10-CM | POA: Diagnosis not present

## 2024-02-02 DIAGNOSIS — N179 Acute kidney failure, unspecified: Secondary | ICD-10-CM | POA: Diagnosis not present

## 2024-02-02 DIAGNOSIS — I129 Hypertensive chronic kidney disease with stage 1 through stage 4 chronic kidney disease, or unspecified chronic kidney disease: Secondary | ICD-10-CM | POA: Diagnosis not present

## 2024-02-02 DIAGNOSIS — E875 Hyperkalemia: Secondary | ICD-10-CM | POA: Diagnosis not present

## 2024-02-04 ENCOUNTER — Other Ambulatory Visit: Payer: Self-pay | Admitting: Family Medicine

## 2024-02-04 DIAGNOSIS — E1043 Type 1 diabetes mellitus with diabetic autonomic (poly)neuropathy: Secondary | ICD-10-CM

## 2024-02-05 ENCOUNTER — Inpatient Hospital Stay

## 2024-02-05 VITALS — BP 132/49 | HR 64 | Temp 97.5°F | Resp 18

## 2024-02-05 DIAGNOSIS — N184 Chronic kidney disease, stage 4 (severe): Secondary | ICD-10-CM | POA: Diagnosis not present

## 2024-02-05 DIAGNOSIS — D631 Anemia in chronic kidney disease: Secondary | ICD-10-CM | POA: Diagnosis not present

## 2024-02-05 LAB — CBC
HCT: 32.6 % — ABNORMAL LOW (ref 36.0–46.0)
Hemoglobin: 10.3 g/dL — ABNORMAL LOW (ref 12.0–15.0)
MCH: 28 pg (ref 26.0–34.0)
MCHC: 31.6 g/dL (ref 30.0–36.0)
MCV: 88.6 fL (ref 80.0–100.0)
Platelets: 247 10*3/uL (ref 150–400)
RBC: 3.68 MIL/uL — ABNORMAL LOW (ref 3.87–5.11)
RDW: 17.3 % — ABNORMAL HIGH (ref 11.5–15.5)
WBC: 7.6 10*3/uL (ref 4.0–10.5)
nRBC: 0 % (ref 0.0–0.2)

## 2024-02-05 MED ORDER — EPOETIN ALFA-EPBX 40000 UNIT/ML IJ SOLN: 20000.0000 [IU] | Freq: Once | INTRAMUSCULAR | Status: DC

## 2024-02-05 MED ORDER — EPOETIN ALFA-EPBX 10000 UNIT/ML IJ SOLN
20000.0000 [IU] | Freq: Once | INTRAMUSCULAR | Status: AC
  Administered 2024-02-05: 20000 [IU] via SUBCUTANEOUS
  Filled 2024-02-05: qty 2

## 2024-02-05 NOTE — Progress Notes (Signed)
 Patient's Hgb 10.3 and blood pressure stable. Patient tolerated Retacrit injection with no complaints voiced.  Site clean and dry with no bruising or swelling noted at site.  See MAR for details.  Band aid applied.  Patient stable during and after injection.  Vss with discharge and left in satisfactory condition with no s/s of distress noted. All follow ups as scheduled.   Pranshu Lyster [employer]

## 2024-02-19 ENCOUNTER — Inpatient Hospital Stay

## 2024-02-19 ENCOUNTER — Inpatient Hospital Stay: Attending: Hematology

## 2024-02-19 DIAGNOSIS — N184 Chronic kidney disease, stage 4 (severe): Secondary | ICD-10-CM | POA: Insufficient documentation

## 2024-02-19 DIAGNOSIS — D631 Anemia in chronic kidney disease: Secondary | ICD-10-CM | POA: Diagnosis not present

## 2024-02-19 LAB — CBC
HCT: 34.6 % — ABNORMAL LOW (ref 36.0–46.0)
Hemoglobin: 11.1 g/dL — ABNORMAL LOW (ref 12.0–15.0)
MCH: 28.1 pg (ref 26.0–34.0)
MCHC: 32.1 g/dL (ref 30.0–36.0)
MCV: 87.6 fL (ref 80.0–100.0)
Platelets: 247 10*3/uL (ref 150–400)
RBC: 3.95 MIL/uL (ref 3.87–5.11)
RDW: 17.8 % — ABNORMAL HIGH (ref 11.5–15.5)
WBC: 7 10*3/uL (ref 4.0–10.5)
nRBC: 0 % (ref 0.0–0.2)

## 2024-02-19 NOTE — Progress Notes (Signed)
 HGB 11.1. NO Retacrit  needed today.

## 2024-02-27 DIAGNOSIS — E1042 Type 1 diabetes mellitus with diabetic polyneuropathy: Secondary | ICD-10-CM | POA: Diagnosis not present

## 2024-02-27 DIAGNOSIS — R809 Proteinuria, unspecified: Secondary | ICD-10-CM | POA: Diagnosis not present

## 2024-02-27 DIAGNOSIS — I10 Essential (primary) hypertension: Secondary | ICD-10-CM | POA: Diagnosis not present

## 2024-02-27 DIAGNOSIS — N189 Chronic kidney disease, unspecified: Secondary | ICD-10-CM | POA: Diagnosis not present

## 2024-02-27 DIAGNOSIS — E119 Type 2 diabetes mellitus without complications: Secondary | ICD-10-CM | POA: Diagnosis not present

## 2024-02-27 DIAGNOSIS — D631 Anemia in chronic kidney disease: Secondary | ICD-10-CM | POA: Diagnosis not present

## 2024-02-27 DIAGNOSIS — E211 Secondary hyperparathyroidism, not elsewhere classified: Secondary | ICD-10-CM | POA: Diagnosis not present

## 2024-03-04 ENCOUNTER — Inpatient Hospital Stay

## 2024-03-04 VITALS — BP 143/46 | HR 61 | Temp 96.9°F | Resp 18

## 2024-03-04 DIAGNOSIS — D631 Anemia in chronic kidney disease: Secondary | ICD-10-CM

## 2024-03-04 DIAGNOSIS — N184 Chronic kidney disease, stage 4 (severe): Secondary | ICD-10-CM

## 2024-03-04 LAB — IRON AND TIBC
Iron: 72 ug/dL (ref 28–170)
Saturation Ratios: 25 % (ref 10.4–31.8)
TIBC: 285 ug/dL (ref 250–450)
UIBC: 213 ug/dL

## 2024-03-04 LAB — CBC WITH DIFFERENTIAL/PLATELET
Abs Immature Granulocytes: 0.03 10*3/uL (ref 0.00–0.07)
Basophils Absolute: 0.1 10*3/uL (ref 0.0–0.1)
Basophils Relative: 1 %
Eosinophils Absolute: 0.1 10*3/uL (ref 0.0–0.5)
Eosinophils Relative: 2 %
HCT: 31.3 % — ABNORMAL LOW (ref 36.0–46.0)
Hemoglobin: 10.1 g/dL — ABNORMAL LOW (ref 12.0–15.0)
Immature Granulocytes: 0 %
Lymphocytes Relative: 15 %
Lymphs Abs: 1.2 10*3/uL (ref 0.7–4.0)
MCH: 27.9 pg (ref 26.0–34.0)
MCHC: 32.3 g/dL (ref 30.0–36.0)
MCV: 86.5 fL (ref 80.0–100.0)
Monocytes Absolute: 0.5 10*3/uL (ref 0.1–1.0)
Monocytes Relative: 7 %
Neutro Abs: 6.1 10*3/uL (ref 1.7–7.7)
Neutrophils Relative %: 75 %
Platelets: 238 10*3/uL (ref 150–400)
RBC: 3.62 MIL/uL — ABNORMAL LOW (ref 3.87–5.11)
RDW: 17.8 % — ABNORMAL HIGH (ref 11.5–15.5)
WBC: 8.1 10*3/uL (ref 4.0–10.5)
nRBC: 0 % (ref 0.0–0.2)

## 2024-03-04 LAB — VITAMIN B12: Vitamin B-12: 537 pg/mL (ref 180–914)

## 2024-03-04 LAB — COMPREHENSIVE METABOLIC PANEL WITH GFR
ALT: 28 U/L (ref 0–44)
AST: 24 U/L (ref 15–41)
Albumin: 3.8 g/dL (ref 3.5–5.0)
Alkaline Phosphatase: 96 U/L (ref 38–126)
Anion gap: 13 (ref 5–15)
BUN: 42 mg/dL — ABNORMAL HIGH (ref 8–23)
CO2: 22 mmol/L (ref 22–32)
Calcium: 9.4 mg/dL (ref 8.9–10.3)
Chloride: 100 mmol/L (ref 98–111)
Creatinine, Ser: 1.9 mg/dL — ABNORMAL HIGH (ref 0.44–1.00)
GFR, Estimated: 28 mL/min — ABNORMAL LOW (ref >60)
Glucose, Bld: 290 mg/dL — ABNORMAL HIGH (ref 70–99)
Potassium: 4.6 mmol/L (ref 3.5–5.1)
Sodium: 135 mmol/L (ref 135–145)
Total Bilirubin: 0.9 mg/dL (ref 0.0–1.2)
Total Protein: 7.3 g/dL (ref 6.5–8.1)

## 2024-03-04 LAB — FERRITIN: Ferritin: 353 ng/mL — ABNORMAL HIGH (ref 11–307)

## 2024-03-04 LAB — FOLATE: Folate: 12.4 ng/mL (ref >5.9)

## 2024-03-04 MED ORDER — EPOETIN ALFA-EPBX 20000 UNIT/ML IJ SOLN
20000.0000 [IU] | Freq: Once | INTRAMUSCULAR | Status: AC
  Administered 2024-03-04: 20000 [IU] via SUBCUTANEOUS
  Filled 2024-03-04: qty 1

## 2024-03-04 NOTE — Patient Instructions (Signed)
 [facility] [place] [facility]  Discharge Instructions: Thank you for choosing [facility] to provide your oncology and hematology care.  If you have a lab appointment with the Cancer Center - please note that after April 8th, 2024, all labs will be drawn in the cancer center.  You do not have to check in or register with the main entrance as you have in the past but will complete your check-in in the cancer center.  Wear comfortable clothing and clothing appropriate for easy access to any Portacath or PICC line.   We strive to give you quality time with your provider. You may need to reschedule your appointment if you arrive late (15 or more minutes).  Arriving late affects you and other patients whose appointments are after yours.  Also, if you miss three or more appointments without notifying the office, you may be dismissed from the clinic at the provider's discretion.      For prescription refill requests, have your pharmacy contact our office and allow 72 hours for refills to be completed.    Today you received the following injection: retacrit   To help prevent nausea and vomiting after your treatment, we encourage you to take your nausea medication as directed.  BELOW ARE SYMPTOMS THAT SHOULD BE REPORTED IMMEDIATELY: *FEVER GREATER THAN 100.4 F (38 C) OR HIGHER *CHILLS OR SWEATING *NAUSEA AND VOMITING THAT IS NOT CONTROLLED WITH YOUR NAUSEA MEDICATION *UNUSUAL SHORTNESS OF BREATH *UNUSUAL BRUISING OR BLEEDING *URINARY PROBLEMS (pain or burning when urinating, or frequent urination) *BOWEL PROBLEMS (unusual diarrhea, constipation, pain near the anus) TENDERNESS IN MOUTH AND THROAT WITH OR WITHOUT PRESENCE OF ULCERS (sore throat, sores in mouth, or a toothache) UNUSUAL RASH, SWELLING OR PAIN  UNUSUAL VAGINAL DISCHARGE OR ITCHING   Items with * indicate a potential emergency and should be followed up as soon as possible or go to the  Emergency Department if any problems should occur.  Please show the CHEMOTHERAPY ALERT CARD or IMMUNOTHERAPY ALERT CARD at check-in to the Emergency Department and triage nurse.  Should you have questions after your visit or need to cancel or reschedule your appointment, please contact [facility] [place] - A DEPT OF [facility] [phone]  and follow the prompts.  Office hours are 8:00 a.m. to 4:30 p.m. Monday - Friday. Please note that voicemails left after 4:00 p.m. may not be returned until the following business day.  We are closed weekends and major holidays. You have access to a nurse at all times for urgent questions. Please call the main number to the clinic [phone] and follow the prompts.  For any non-urgent questions, you may also contact your provider using MyChart. We now offer e-Visits for anyone 29 and older to request care online for non-urgent symptoms. For details visit [email or web address].   Also download the MyChart app! Go to the app store, search "MyChart", open the app, select [place], and log in with your MyChart username and password.

## 2024-03-04 NOTE — Progress Notes (Signed)
 Retacrit injection given per orders. Patient tolerated it well without problems. Vitals stable and discharged home from clinic via wheelchair. Follow up as scheduled.

## 2024-03-05 DIAGNOSIS — E1129 Type 2 diabetes mellitus with other diabetic kidney complication: Secondary | ICD-10-CM | POA: Diagnosis not present

## 2024-03-05 DIAGNOSIS — I129 Hypertensive chronic kidney disease with stage 1 through stage 4 chronic kidney disease, or unspecified chronic kidney disease: Secondary | ICD-10-CM | POA: Diagnosis not present

## 2024-03-05 DIAGNOSIS — E1122 Type 2 diabetes mellitus with diabetic chronic kidney disease: Secondary | ICD-10-CM | POA: Diagnosis not present

## 2024-03-05 DIAGNOSIS — N1832 Chronic kidney disease, stage 3b: Secondary | ICD-10-CM | POA: Diagnosis not present

## 2024-03-17 ENCOUNTER — Encounter: Payer: Self-pay | Admitting: Oncology

## 2024-03-18 ENCOUNTER — Inpatient Hospital Stay: Attending: Oncology | Admitting: Oncology

## 2024-03-18 ENCOUNTER — Inpatient Hospital Stay

## 2024-03-18 ENCOUNTER — Encounter: Payer: Self-pay | Admitting: Oncology

## 2024-03-18 VITALS — BP 137/48 | HR 64 | Temp 98.6°F | Resp 18

## 2024-03-18 DIAGNOSIS — D631 Anemia in chronic kidney disease: Secondary | ICD-10-CM | POA: Diagnosis not present

## 2024-03-18 DIAGNOSIS — N184 Chronic kidney disease, stage 4 (severe): Secondary | ICD-10-CM

## 2024-03-18 LAB — CBC
HCT: 30 % — ABNORMAL LOW (ref 36.0–46.0)
Hemoglobin: 9.7 g/dL — ABNORMAL LOW (ref 12.0–15.0)
MCH: 28.9 pg (ref 26.0–34.0)
MCHC: 32.3 g/dL (ref 30.0–36.0)
MCV: 89.3 fL (ref 80.0–100.0)
Platelets: 222 K/uL (ref 150–400)
RBC: 3.36 MIL/uL — ABNORMAL LOW (ref 3.87–5.11)
RDW: 19.4 % — ABNORMAL HIGH (ref 11.5–15.5)
WBC: 7.3 K/uL (ref 4.0–10.5)
nRBC: 0 % (ref 0.0–0.2)

## 2024-03-18 MED ORDER — EPOETIN ALFA-EPBX 20000 UNIT/ML IJ SOLN
20000.0000 [IU] | Freq: Once | INTRAMUSCULAR | Status: AC
  Administered 2024-03-18: 20000 [IU] via SUBCUTANEOUS
  Filled 2024-03-18: qty 1

## 2024-03-18 NOTE — Progress Notes (Signed)
 Patient presents today fro retacrit  injection per providers order.  Vital signs and labs reviewed by MD.  Hgb noted to be 9.7.  Message received from Dr. Davonna patient okay for injection.  Stable during administration without incident; injection site WNL; see MAR for injection details.  Patient tolerated procedure well and without incident.  No questions or complaints noted at this time.

## 2024-03-18 NOTE — Patient Instructions (Signed)
 [facility] [place] [facility]  Discharge Instructions: Thank you for choosing [facility] to provide your oncology and hematology care.  If you have a lab appointment with the Cancer Center - please note that after April 8th, 2024, all labs will be drawn in the cancer center.  You do not have to check in or register with the main entrance as you have in the past but will complete your check-in in the cancer center.  Wear comfortable clothing and clothing appropriate for easy access to any Portacath or PICC line.   We strive to give you quality time with your provider. You may need to reschedule your appointment if you arrive late (15 or more minutes).  Arriving late affects you and other patients whose appointments are after yours.  Also, if you miss three or more appointments without notifying the office, you may be dismissed from the clinic at the provider's discretion.      For prescription refill requests, have your pharmacy contact our office and allow 72 hours for refills to be completed.    Today you received the following chemotherapy and/or immunotherapy agents retacrit      To help prevent nausea and vomiting after your treatment, we encourage you to take your nausea medication as directed.  BELOW ARE SYMPTOMS THAT SHOULD BE REPORTED IMMEDIATELY: *FEVER GREATER THAN 100.4 F (38 C) OR HIGHER *CHILLS OR SWEATING *NAUSEA AND VOMITING THAT IS NOT CONTROLLED WITH YOUR NAUSEA MEDICATION *UNUSUAL SHORTNESS OF BREATH *UNUSUAL BRUISING OR BLEEDING *URINARY PROBLEMS (pain or burning when urinating, or frequent urination) *BOWEL PROBLEMS (unusual diarrhea, constipation, pain near the anus) TENDERNESS IN MOUTH AND THROAT WITH OR WITHOUT PRESENCE OF ULCERS (sore throat, sores in mouth, or a toothache) UNUSUAL RASH, SWELLING OR PAIN  UNUSUAL VAGINAL DISCHARGE OR ITCHING   Items with * indicate a potential emergency and should be followed up  as soon as possible or go to the Emergency Department if any problems should occur.  Please show the CHEMOTHERAPY ALERT CARD or IMMUNOTHERAPY ALERT CARD at check-in to the Emergency Department and triage nurse.  Should you have questions after your visit or need to cancel or reschedule your appointment, please contact [facility] CTR  - A DEPT OF [facility] [phone]  and follow the prompts.  Office hours are 8:00 a.m. to 4:30 p.m. Monday - Friday. Please note that voicemails left after 4:00 p.m. may not be returned until the following business day.  We are closed weekends and major holidays. You have access to a nurse at all times for urgent questions. Please call the main number to the clinic [phone] and follow the prompts.  For any non-urgent questions, you may also contact your provider using MyChart. We now offer e-Visits for anyone 81 and older to request care online for non-urgent symptoms. For details visit [email or web address].   Also download the MyChart app! Go to the app store, search "MyChart", open the app, select [place], and log in with your MyChart username and password.

## 2024-03-18 NOTE — Progress Notes (Signed)
 [facility] at [facility]  HEMATOLOGY FOLLOW-UP VISIT  [name], [name], FNP  REASON FOR FOLLOW-UP: Anemia of chronic kidney disease  ASSESSMENT & PLAN:  Patient is a 73 year old female with chronic kidney disease following for anemia of chronic kidney disease.  Currently on EPO shots   Anemia in chronic kidney disease Patient has stable hemoglobin and has been receiving EPO shots.  Iron labs did not show evidence of iron deficiency.   SPEP negative for M spike.  Free light chain slightly elevated but normal ratio Patient did not have significant improvement with the ESA dosage of 10,000 units every 2 weeks Currently on ESA dosing 20,000 units every 2 weeks.  Showed some response and was able to give 1 month spacing in between doses.  - Continue ESA every 2 weeks -Labs reviewed today, hemoglobin of 9.7, will administer ESA today. - Monitor hemoglobin levels. - Maintain a TSAT goal of 25-30 and CKD  Return to clinic in 2 month with labs.  CKD (chronic kidney disease) stage 4, GFR 15-29 ml/min (HCC) Patient has a history of CKD being followed by Dr. Rachele  -Continue to follow with nephrology   Orders Placed This Encounter  Procedures   Ferritin    Standing Status:   Future    Expected Date:   05/17/2024    Expiration Date:   08/15/2024   Folate    Standing Status:   Future    Expected Date:   05/17/2024    Expiration Date:   08/15/2024   Vitamin B12    Standing Status:   Future    Expected Date:   05/17/2024    Expiration Date:   08/15/2024   CBC with Differential/Platelet    Standing Status:   Future    Expected Date:   05/17/2024    Expiration Date:   08/15/2024   Comprehensive metabolic panel with GFR    Standing Status:   Future    Expected Date:   05/17/2024    Expiration Date:   08/15/2024   Iron and TIBC    Standing Status:   Future    Expected Date:   05/17/2024    Expiration Date:   08/15/2024    The total time spent in the appointment was 20  minutes encounter with patients including review of chart and various tests results, discussions about plan of care and coordination of care plan   All questions were answered. The patient knows to call the clinic with any problems, questions or concerns. No barriers to learning was detected.  Mickiel Dry, MD 7/11/20253:23 PM    INTERVAL HISTORY: Bridget Martinez 73 y.o. female being followed for anemia of chronic kidney disease.  She is accompanied by her daughter in law today. She reports feeling good overall.  No complaints today and reported improvement in fatigue.  She is seeing her nephrologist on a regular basis.   I have reviewed the past medical history, past surgical history, social history and family history with the patient   ALLERGIES:  is allergic to penicillins, sulfa antibiotics, ciprofloxacin , macrobid  [nitrofurantoin  monohyd macro], other, and ciprofloxacin  hcl.  MEDICATIONS:  Current Outpatient Medications  Medication Sig Dispense Refill   aspirin  81 MG EC tablet Take 1 tablet (81 mg total) by mouth daily. 30 tablet 3   buPROPion  (WELLBUTRIN  XL) 150 MG 24 hr tablet Take 1 tablet (150 mg total) by mouth 2 (two) times daily. 180 tablet 1   carvedilol  (COREG ) 12.5 MG  tablet TAKE 1 TABLET (12.5MG  TOTAL) BY MOUTH TWICE A DAY WITH MEALS 60 tablet 10   Continuous Blood Gluc Receiver (DEXCOM G6 RECEIVER) DEVI See admin instructions.     Continuous Blood Gluc Sensor (DEXCOM G6 SENSOR) MISC See admin instructions.     epoetin  alfa (EPOGEN ) 3000 UNIT/ML injection Inject into the skin.     ferrous sulfate  325 (65 FE) MG EC tablet Take 1 tablet (325 mg total) by mouth every other day. 45 tablet 3   furosemide  (LASIX ) 20 MG tablet Take 20 mg by mouth 3 (three) times a week.     insulin  aspart (NOVOLOG  FLEXPEN) 100 UNIT/ML FlexPen 13 to 17 units at breakfast, 7 to 11 units at lunch and at supper     insulin  glargine (LANTUS ) 100 UNIT/ML injection Inject into the skin. 18 units  AM & 12 units PM     LANTUS  SOLOSTAR 100 UNIT/ML Solostar Pen INJECT 20 UNITS EACH MORNING AND 20 UNITS EACH EVENING SUBCUTANEOUS TWICE A DAY 30 DAYS 37     levothyroxine  (SYNTHROID ) 75 MCG tablet 1 tablet on an empty stomach every morning Orally Once a day for 30 days     metoCLOPramide  (REGLAN ) 5 MG tablet TAKE 1 TABLET BY MOUTH 3 TIMES DAILY BEFORE MEALS. 270 tablet 0   mirtazapine  (REMERON ) 15 MG tablet Take 1 tablet (15 mg total) by mouth at bedtime. 90 tablet 1   ondansetron  (ZOFRAN ) 4 MG tablet Take 4 mg by mouth every 8 (eight) hours as needed.     pantoprazole  (PROTONIX ) 40 MG tablet Take 1 tablet (40 mg total) by mouth daily. 90 tablet 1   potassium chloride  (KLOR-CON ) 20 MEQ packet Take by mouth 2 (two) times daily.     rosuvastatin  (CRESTOR ) 40 MG tablet Take 1 tablet (40 mg total) by mouth daily. 90 tablet 1   sodium bicarbonate  650 MG tablet Take 650 mg by mouth 3 (three) times daily.     No current facility-administered medications for this visit.     REVIEW OF SYSTEMS:   Constitutional: Denies fevers, chills or night sweats Eyes: Denies blurriness of vision Ears, nose, mouth, throat, and face: Denies mucositis or sore throat Respiratory: Denies cough, dyspnea or wheezes Cardiovascular: Denies palpitation, chest discomfort or lower extremity swelling Gastrointestinal:  Denies nausea, heartburn or change in bowel habits Skin: Denies abnormal skin rashes Lymphatics: Denies new lymphadenopathy or easy bruising Neurological:Denies numbness, tingling or new weaknesses Behavioral/Psych: Mood is stable, no new changes  All other systems were reviewed with the patient and are negative.  PHYSICAL EXAMINATION:   Vitals:   03/18/24 1310  BP: (!) 137/48  Pulse: 64  Resp: 18  Temp: 98.6 F (37 C)  SpO2: 100%    GENERAL:alert, no distress and comfortable SKIN: skin color, texture, turgor are normal, no rashes or significant lesions LUNGS: clear to auscultation and  percussion with normal breathing effort HEART: regular rate & rhythm and no murmurs and no lower extremity edema ABDOMEN:abdomen soft, non-tender and normal bowel sounds Musculoskeletal:no cyanosis of digits and no clubbing  NEURO: alert & oriented x 3 with fluent speech  LABORATORY DATA:  I have reviewed the data as listed  Lab Results  Component Value Date   WBC 7.3 03/18/2024   NEUTROABS 6.1 03/04/2024   HGB 9.7 (L) 03/18/2024   HCT 30.0 (L) 03/18/2024   MCV 89.3 03/18/2024   PLT 222 03/18/2024       Chemistry      Component Value Date/Time  NA 135 03/04/2024 1021   NA 139 11/21/2023 0927   K 4.6 03/04/2024 1021   CL 100 03/04/2024 1021   CO2 22 03/04/2024 1021   BUN 42 (H) 03/04/2024 1021   BUN 32 (H) 11/21/2023 0927   CREATININE 1.90 (H) 03/04/2024 1021   GLU 416 01/18/2022 0000      Component Value Date/Time   CALCIUM  9.4 03/04/2024 1021   ALKPHOS 96 03/04/2024 1021   AST 24 03/04/2024 1021   ALT 28 03/04/2024 1021   BILITOT 0.9 03/04/2024 1021   BILITOT 0.4 11/21/2023 0927      Latest Reference Range & Units 10/22/23 14:10  Total Protein ELP 6.0 - 8.5 g/dL 6.4 (C)  Albumin  SerPl Elph-Mcnc 2.9 - 4.4 g/dL 3.6 (C)  Albumin /Glob SerPl 0.7 - 1.7  1.3 (C)  Alpha2 Glob SerPl Elph-Mcnc 0.4 - 1.0 g/dL 0.7 (C)  Alpha 1 0.0 - 0.4 g/dL 0.2 (C)  Gamma Glob SerPl Elph-Mcnc 0.4 - 1.8 g/dL 1.0 (C)  M Protein SerPl Elph-Mcnc Not Observed g/dL Not Observed (C)  IFE 1  Comment (C)  Globulin, Total 2.2 - 3.9 g/dL 2.8 (C)  B-Globulin SerPl Elph-Mcnc 0.7 - 1.3 g/dL 0.9 (C)  IgG (Immunoglobin G), Serum 586 - 1,602 mg/dL 8,978  IgM (Immunoglobulin M), Srm 26 - 217 mg/dL 88  IgA 64 - 577 mg/dL 767  (C): Corrected  Latest Reference Range & Units 03/04/24 10:21  Iron 28 - 170 ug/dL 72  UIBC ug/dL 786  TIBC 749 - 549 ug/dL 714  Saturation Ratios 10.4 - 31.8 % 25  Ferritin 11 - 307 ng/mL 353 (H)  Folate >5.9 ng/mL 12.4  Vitamin B12 180 - 914 pg/mL 537  (H): Data is  abnormally high

## 2024-03-19 ENCOUNTER — Encounter: Payer: Self-pay | Admitting: Oncology

## 2024-03-19 NOTE — Assessment & Plan Note (Addendum)
 Patient has stable hemoglobin and has been receiving EPO shots.  Iron labs did not show evidence of iron deficiency.   SPEP negative for M spike.  Free light chain slightly elevated but normal ratio Patient did not have significant improvement with the ESA dosage of 10,000 units every 2 weeks Currently on ESA dosing 20,000 units every 2 weeks.  Showed some response and was able to give 1 month spacing in between doses.  - Continue ESA every 2 weeks -Labs reviewed today, hemoglobin of 9.7, will administer ESA today. - Monitor hemoglobin levels. - Maintain a TSAT goal of 25-30 and CKD  Return to clinic in 2 month with labs.

## 2024-03-19 NOTE — Assessment & Plan Note (Signed)
 Patient has a history of CKD being followed by Dr. Wolfgang Phoenix -Continue to follow with nephrology

## 2024-03-25 DIAGNOSIS — R809 Proteinuria, unspecified: Secondary | ICD-10-CM | POA: Diagnosis not present

## 2024-03-25 DIAGNOSIS — D631 Anemia in chronic kidney disease: Secondary | ICD-10-CM | POA: Diagnosis not present

## 2024-03-25 DIAGNOSIS — N189 Chronic kidney disease, unspecified: Secondary | ICD-10-CM | POA: Diagnosis not present

## 2024-03-25 DIAGNOSIS — E211 Secondary hyperparathyroidism, not elsewhere classified: Secondary | ICD-10-CM | POA: Diagnosis not present

## 2024-03-29 DIAGNOSIS — E1122 Type 2 diabetes mellitus with diabetic chronic kidney disease: Secondary | ICD-10-CM | POA: Diagnosis not present

## 2024-03-29 DIAGNOSIS — N1832 Chronic kidney disease, stage 3b: Secondary | ICD-10-CM | POA: Diagnosis not present

## 2024-03-29 DIAGNOSIS — N179 Acute kidney failure, unspecified: Secondary | ICD-10-CM | POA: Diagnosis not present

## 2024-03-29 DIAGNOSIS — R809 Proteinuria, unspecified: Secondary | ICD-10-CM | POA: Diagnosis not present

## 2024-04-01 ENCOUNTER — Inpatient Hospital Stay

## 2024-04-02 ENCOUNTER — Inpatient Hospital Stay

## 2024-04-02 VITALS — BP 121/48 | HR 57 | Temp 97.8°F | Resp 16

## 2024-04-02 DIAGNOSIS — D631 Anemia in chronic kidney disease: Secondary | ICD-10-CM

## 2024-04-02 DIAGNOSIS — N184 Chronic kidney disease, stage 4 (severe): Secondary | ICD-10-CM | POA: Diagnosis not present

## 2024-04-02 LAB — CBC
HCT: 31.3 % — ABNORMAL LOW (ref 36.0–46.0)
Hemoglobin: 9.7 g/dL — ABNORMAL LOW (ref 12.0–15.0)
MCH: 28 pg (ref 26.0–34.0)
MCHC: 31 g/dL (ref 30.0–36.0)
MCV: 90.2 fL (ref 80.0–100.0)
Platelets: 242 K/uL (ref 150–400)
RBC: 3.47 MIL/uL — ABNORMAL LOW (ref 3.87–5.11)
RDW: 19.6 % — ABNORMAL HIGH (ref 11.5–15.5)
WBC: 7.3 K/uL (ref 4.0–10.5)
nRBC: 0 % (ref 0.0–0.2)

## 2024-04-02 MED ORDER — EPOETIN ALFA-EPBX 20000 UNIT/ML IJ SOLN
20000.0000 [IU] | Freq: Once | INTRAMUSCULAR | Status: AC
  Administered 2024-04-02: 20000 [IU] via SUBCUTANEOUS
  Filled 2024-04-02: qty 1

## 2024-04-02 NOTE — Patient Instructions (Signed)
 [facility] [place] - A DEPT OF [facility]  Discharge Instructions: Thank you for choosing [facility] to provide your oncology and hematology care.  If you have a lab appointment with the Cancer Center - please note that after April 8th, 2024, all labs will be drawn in the cancer center.  You do not have to check in or register with the main entrance as you have in the past but will complete your check-in in the cancer center.  Wear comfortable clothing and clothing appropriate for easy access to any Portacath or PICC line.   We strive to give you quality time with your provider. You may need to reschedule your appointment if you arrive late (15 or more minutes).  Arriving late affects you and other patients whose appointments are after yours.  Also, if you miss three or more appointments without notifying the office, you may be dismissed from the clinic at the provider's discretion.      For prescription refill requests, have your pharmacy contact our office and allow 72 hours for refills to be completed.    Today you received the following :  Retacrit.  Epoetin Alfa Injection What is this medication? EPOETIN ALFA (e POE e tin AL fa) treats low levels of red blood cells (anemia) caused by kidney disease, chemotherapy, or HIV medications. It can also be used in people who are at risk for blood loss during surgery. It works by Systems analyst make more red blood cells, which reduces the need for blood transfusions. This medicine may be used for other purposes; ask your health care provider or pharmacist if you have questions. COMMON BRAND NAME(S): Epogen, Procrit, Retacrit What should I tell my care team before I take this medication? They need to know if you have any of these conditions: Blood clots Cancer Heart disease High blood pressure On dialysis Seizures Stroke An unusual or allergic reaction to epoetin alfa, albumin, benzyl alcohol, other  medications, foods, dyes, or preservatives Pregnant or trying to get pregnant Breast-feeding How should I use this medication? This medication is injected into a vein or under the skin. It is usually given by your care team in a hospital or clinic setting. It may also be given at home. If you get this medication at home, you will be taught how to prepare and give it. Use exactly as directed. Take it as directed on the prescription label at the same time every day. Keep taking it unless your care team tells you to stop. It is important that you put your used needles and syringes in a special sharps container. Do not put them in a trash can. If you do not have a sharps container, call your pharmacist or care team to get one. A special MedGuide will be given to you by the pharmacist with each prescription and refill. Be sure to read this information carefully each time. Talk to your care team about the use of this medication in children. While this medication may be used in children as young as 1 month of age for selected conditions, precautions do apply. Overdosage: If you think you have taken too much of this medicine contact a poison control center or emergency room at once. NOTE: This medicine is only for you. Do not share this medicine with others. What if I miss a dose? If you miss a dose, take it as soon as you can. If it is almost time for your  next dose, take only that dose. Do not take double or extra doses. What may interact with this medication? Darbepoetin alfa Methoxy polyethylene glycol-epoetin beta This list may not describe all possible interactions. Give your health care provider a list of all the medicines, herbs, non-prescription drugs, or dietary supplements you use. Also tell them if you smoke, drink alcohol, or use illegal drugs. Some items may interact with your medicine. What should I watch for while using this medication? Visit your care team for regular checks on your  progress. Check your blood pressure as directed. Know what your blood pressure should be and when to contact your care team. Your condition will be monitored carefully while you are receiving this medication. You may need blood work while taking this medication. What side effects may I notice from receiving this medication? Side effects that you should report to your care team as soon as possible: Allergic reactions--skin rash, itching, hives, swelling of the face, lips, tongue, or throat Blood clot--pain, swelling, or warmth in the leg, shortness of breath, chest pain Heart attack--pain or tightness in the chest, shoulders, arms, or jaw, nausea, shortness of breath, cold or clammy skin, feeling faint or lightheaded Increase in blood pressure Rash, fever, and swollen lymph nodes Redness, blistering, peeling, or loosening of the skin, including inside the mouth Seizures Stroke--sudden numbness or weakness of the face, arm, or leg, trouble speaking, confusion, trouble walking, loss of balance or coordination, dizziness, severe headache, change in vision Side effects that usually do not require medical attention (report to your care team if they continue or are bothersome): Bone, joint, or muscle pain Cough Headache Nausea Pain, redness, or irritation at injection site This list may not describe all possible side effects. Call your doctor for medical advice about side effects. You may report side effects to FDA at 1-800-FDA-1088. Where should I keep my medication? Keep out of the reach of children and pets. Store in a refrigerator. Do not freeze. Do not shake. Protect from light. Keep this medication in the original container until you are ready to take it. See product for storage information. Get rid of any unused medication after the expiration date. To get rid of medications that are no longer needed or have expired: Take the medication to a medication take-back program. Check with your  pharmacy or law enforcement to find a location. If you cannot return the medication, ask your pharmacist or care team how to get rid of the medication safely. NOTE: This sheet is a summary. It may not cover all possible information. If you have questions about this medicine, talk to your doctor, pharmacist, or health care provider.  2024 Elsevier/Gold Standard (2021-12-28 00:00:00)    To help prevent nausea and vomiting after your treatment, we encourage you to take your nausea medication as directed.  BELOW ARE SYMPTOMS THAT SHOULD BE REPORTED IMMEDIATELY: *FEVER GREATER THAN 100.4 F (38 C) OR HIGHER *CHILLS OR SWEATING *NAUSEA AND VOMITING THAT IS NOT CONTROLLED WITH YOUR NAUSEA MEDICATION *UNUSUAL SHORTNESS OF BREATH *UNUSUAL BRUISING OR BLEEDING *URINARY PROBLEMS (pain or burning when urinating, or frequent urination) *BOWEL PROBLEMS (unusual diarrhea, constipation, pain near the anus) TENDERNESS IN MOUTH AND THROAT WITH OR WITHOUT PRESENCE OF ULCERS (sore throat, sores in mouth, or a toothache) UNUSUAL RASH, SWELLING OR PAIN  UNUSUAL VAGINAL DISCHARGE OR ITCHING   Items with * indicate a potential emergency and should be followed up as soon as possible or go to the Emergency Department if any problems should  occur.  Please show the CHEMOTHERAPY ALERT CARD or IMMUNOTHERAPY ALERT CARD at check-in to the Emergency Department and triage nurse.  Should you have questions after your visit or need to cancel or reschedule your appointment, please contact [facility] [place] - A DEPT OF [facility] [phone]  and follow the prompts.  Office hours are 8:00 a.m. to 4:30 p.m. Monday - Friday. Please note that voicemails left after 4:00 p.m. may not be returned until the following business day.  We are closed weekends and major holidays. You have access to a nurse at all times for urgent questions. Please call the main number to the clinic [phone] and follow the  prompts.  For any non-urgent questions, you may also contact your provider using MyChart. We now offer e-Visits for anyone 73 and older to request care online for non-urgent symptoms. For details visit [email or web address].   Also download the MyChart app! Go to the app store, search "MyChart", open the app, select [place], and log in with your MyChart username and password.

## 2024-04-02 NOTE — Progress Notes (Signed)
 Hemoglobin today is 9.7.  We will proceed with Retacrit  injection per provider orders.   Patient tolerated injection with no complaints voiced.  Site clean and dry with no bruising or swelling noted.  No complaints of pain.  Discharged with vital signs stable and no signs or symptoms of distress noted.

## 2024-04-07 ENCOUNTER — Ambulatory Visit: Payer: Medicare PPO

## 2024-04-07 VITALS — BP 122/67 | HR 66 | Ht 64.0 in | Wt 135.0 lb

## 2024-04-07 DIAGNOSIS — Z0000 Encounter for general adult medical examination without abnormal findings: Secondary | ICD-10-CM | POA: Diagnosis not present

## 2024-04-07 NOTE — Patient Instructions (Signed)
 [name] , Thank you for taking time out of your busy schedule to complete your Annual Wellness Visit with me. I enjoyed our conversation and look forward to speaking with you again next year. I, as well as your care team,  appreciate your ongoing commitment to your health goals. Please review the following plan we discussed and let me know if I can assist you in the future. Your Game plan/ To Do List    Follow up Visits: Next Medicare AWV with our clinical staff: 04/08/25 at 10:00a.m.   Have you seen your provider in the last 6 months (3 months if uncontrolled diabetes)? Yes Next Office Visit with your provider: 05/28/24 at 8:35a.m.  Clinician Recommendations:  Aim for 30 minutes of exercise or brisk walking, 6-8 glasses of water , and 5 servings of fruits and vegetables each day.       This is a list of the screening recommended for you and due dates:  Health Maintenance  Topic Date Due   Eye exam for diabetics  10/14/2019   COVID-19 Vaccine (3 - Moderna risk series) 03/31/2020   Hemoglobin A1C  10/27/2022   Yearly kidney health urinalysis for diabetes  01/19/2023   Medicare Annual Wellness Visit  04/06/2024   DEXA scan (bone density measurement)  07/03/2024*   Mammogram  11/20/2024*   Colon Cancer Screening  11/20/2024*   Flu Shot  04/09/2024   Yearly kidney function blood test for diabetes  03/04/2025   DTaP/Tdap/Td vaccine (2 - Td or Tdap) 10/06/2028   Pneumococcal Vaccine for age over 76  Completed   Hepatitis C Screening  Completed   Zoster (Shingles) Vaccine  Completed   Hepatitis B Vaccine  Aged Out   HPV Vaccine  Aged Out   Meningitis B Vaccine  Aged Out   Complete foot exam   Discontinued  *Topic was postponed. The date shown is not the original due date.    Advanced directives: (Copy Requested) Please bring a copy of your health care power of attorney and living will to the office to be added to your chart at your convenience. You can mail to [facility] [address] [place], [place] [address] or email to [email or web address] Advance Care Planning is important because it:  [x]  Makes sure you receive the medical care that is consistent with your values, goals, and preferences  [x]  It provides guidance to your family and loved ones and reduces their decisional burden about whether or not they are making the right decisions based on your wishes.  Follow the link provided in your after visit summary or read over the paperwork we have mailed to you to help you started getting your Advance Directives in place. If you need assistance in completing these, please reach out to us  so that we can help you!  See attachments for Preventive Care and Fall Prevention Tips.

## 2024-04-07 NOTE — Progress Notes (Signed)
 Subjective:   Bridget Martinez is a 73 y.o. who presents for a Medicare Wellness preventive visit.  As a reminder, Annual Wellness Visits don't include a physical exam, and some assessments may be limited, especially if this visit is performed virtually. We may recommend an in-person follow-up visit with your provider if needed.  Visit Complete: Virtual I connected with  Bridget Martinez on 04/07/24 by a audio enabled telemedicine application and verified that I am speaking with the correct person using two identifiers.  Patient Location: Home  Provider Location: Home Office  I discussed the limitations of evaluation and management by telemedicine. The patient expressed understanding and agreed to proceed.  Vital Signs: Because this visit was a virtual/telehealth visit, some criteria may be missing or patient reported. Any vitals not documented were not able to be obtained and vitals that have been documented are patient reported.  VideoDeclined- This patient declined Librarian, academic. Therefore the visit was completed with audio only.  Persons Participating in Visit: Patient.  AWV Questionnaire: No: Patient Medicare AWV questionnaire was not completed prior to this visit.  Cardiac Risk Factors include: advanced age (>33men, >72 women);diabetes mellitus;dyslipidemia;hypertension     Objective:    Today's Vitals   04/07/24 1055  BP: 122/67  Pulse: 66  Weight: 135 lb (61.2 kg)  Height: 5' 4 (1.626 m)   Body mass index is 23.17 kg/m.     04/07/2024   11:00 AM 03/18/2024    1:08 PM 01/08/2024    3:24 PM 01/08/2024    2:39 PM 11/24/2023    1:36 PM 11/10/2023    1:34 PM 10/24/2023   10:54 AM  Advanced Directives  Does Patient Have a Medical Advance Directive? Yes Yes No Yes Yes Yes Yes  Type of Estate agent of [place];Living will Healthcare Power of [place];Living will  Healthcare Power of [place];Living will Living  will;Healthcare Power of [employer] Power of [place];Living will Healthcare Power of [place];Living will  Does patient want to make changes to medical advance directive?  No - Patient declined No - Patient declined No - Patient declined No - Patient declined    Copy of Healthcare Power of Attorney in Chart? No - copy requested No - copy requested  No - copy requested  No - copy requested No - copy requested  Would patient like information on creating a medical advance directive?  No - Patient declined No - Patient declined No - Patient declined No - Patient declined No - Patient declined No - Patient declined    Current Medications (verified) Outpatient Encounter Medications as of 04/07/2024  Medication Sig   aspirin  81 MG EC tablet Take 1 tablet (81 mg total) by mouth daily.   buPROPion  (WELLBUTRIN  XL) 150 MG 24 hr tablet Take 1 tablet (150 mg total) by mouth 2 (two) times daily.   carvedilol  (COREG ) 12.5 MG tablet TAKE 1 TABLET (12.5MG  TOTAL) BY MOUTH TWICE A DAY WITH MEALS   Continuous Blood Gluc Receiver (DEXCOM G6 RECEIVER) DEVI See admin instructions.   Continuous Blood Gluc Sensor (DEXCOM G6 SENSOR) MISC See admin instructions.   epoetin  alfa (EPOGEN ) 3000 UNIT/ML injection Inject into the skin.   ferrous sulfate  325 (65 FE) MG EC tablet Take 1 tablet (325 mg total) by mouth every other day.   furosemide  (LASIX ) 20 MG tablet Take 20 mg by mouth 3 (three) times a week. Per pt 04/07/24 taking 20mg  by mouth 2 times/week   insulin  aspart (  NOVOLOG  FLEXPEN) 100 UNIT/ML FlexPen 13 to 17 units at breakfast, 7 to 11 units at lunch and at supper   insulin  glargine (LANTUS ) 100 UNIT/ML injection Inject into the skin. 18 units AM & 12 units PM   LANTUS  SOLOSTAR 100 UNIT/ML Solostar Pen INJECT 20 UNITS EACH MORNING AND 20 UNITS EACH EVENING SUBCUTANEOUS TWICE A DAY 30 DAYS 37   levothyroxine  (SYNTHROID ) 75 MCG tablet 1 tablet on an empty stomach every morning Orally Once a day for 30 days    metoCLOPramide  (REGLAN ) 5 MG tablet TAKE 1 TABLET BY MOUTH 3 TIMES DAILY BEFORE MEALS.   mirtazapine  (REMERON ) 15 MG tablet Take 1 tablet (15 mg total) by mouth at bedtime.   ondansetron  (ZOFRAN ) 4 MG tablet Take 4 mg by mouth every 8 (eight) hours as needed.   pantoprazole  (PROTONIX ) 40 MG tablet Take 1 tablet (40 mg total) by mouth daily.   potassium chloride  (KLOR-CON ) 20 MEQ packet Take by mouth 2 (two) times daily.   rosuvastatin  (CRESTOR ) 40 MG tablet Take 1 tablet (40 mg total) by mouth daily.   sodium bicarbonate  650 MG tablet Take 650 mg by mouth 3 (three) times daily.   No facility-administered encounter medications on file as of 04/07/2024.    Allergies (verified) Penicillins, Sulfa antibiotics, Ciprofloxacin , Macrobid  [nitrofurantoin  monohyd macro], Other, and Ciprofloxacin  hcl   History: Past Medical History:  Diagnosis Date   Arthritis    CHF (congestive heart failure) (HCC)    Coronary artery disease    angina.  MI.    Depression    anxiety   Diabetes mellitus    on insulin , with h/o DKA    GERD (gastroesophageal reflux disease)    Hiatal hernia.    HTN (hypertension)    Hyperlipidemia    Hypothyroidism    Left displaced femoral neck fracture (HCC) 11/19/2014   Myocardial infarction [facility])    NSTEMI 07/2011 with cardiogenic shock, s/p CABG 09/2011   Osteoporosis    Pneumonia        Tuberculosis    Venous stasis ulcers (HCC)    Past Surgical History:  Procedure Laterality Date   CARDIAC CATHETERIZATION     COLONOSCOPY N/A 11/18/2013   Procedure: COLONOSCOPY;  Surgeon: Princella CHRISTELLA Nida, MD;  Location: [facility] ENDOSCOPY;  Service: Endoscopy;  Laterality: N/A;   CORONARY ARTERY BYPASS GRAFT     Dr. Fleeta Trigt in 09/2011    CORONARY ARTERY BYPASS GRAFT  2012   ESOPHAGOGASTRODUODENOSCOPY N/A 11/17/2013   Procedure: ESOPHAGOGASTRODUODENOSCOPY (EGD);  Surgeon: Princella CHRISTELLA Nida, MD;  Location: [facility] ENDOSCOPY;  Service: Endoscopy;  Laterality: N/A;   LEFT HEART CATHETERIZATION WITH  CORONARY ANGIOGRAM N/A 07/26/2011   Procedure: LEFT HEART CATHETERIZATION WITH CORONARY ANGIOGRAM;  Surgeon: Ezra GORMAN Shuck, MD;  Location[facility] C[facility] LAB;  Service: Cardiovascular;  Laterality: N/A;   RIGHT HEART CATHETERIZATION N/A 07/29/2011   Procedure: RIGHT HEART CATH;  Surgeon: Lynwood Schilling, MD;  Location: [facility] CATH LAB;  Service: Cardiovascular;  Laterality: N/A;   TONSILLECTOMY     TOTAL HIP ARTHROPLASTY Left 11/21/2014   Procedure: TOTAL HIP ARTHROPLASTY ANTERIOR APPROACH;  Surgeon: Lonni CINDERELLA Poli, MD;  Location: [facility] OR;  Service: Orthopedics;  Laterality: Left;   Family History  Problem Relation Age of Onset   Heart disease Father    Multiple sclerosis Father    Hypertension Mother    Hyperthyroidism Mother    Diabetes Mother    Heart attack Paternal Grandfather    Heart disease Paternal Grandfather  Diabetes Cousin        Multiple maternal cousins with type 2 diabetes mellitus   Diabetes Maternal Uncle        Type 1 diabetes mellitus   Healthy Daughter    Asthma Son    Diabetes Son    Social History   Socioeconomic History   Marital status: Widowed    Spouse name: Abby; Son Zell   Number of children: 2   Years of education: 18   Highest education level: Bachelor's degree (e.g., BA, AB, BS)  Occupational History   Occupation: Retired  Tobacco Use   Smoking status: Never   Smokeless tobacco: Never  Vaping Use   Vaping status: Never Used  Substance and Sexual Activity   Alcohol use: No   Drug use: No   Sexual activity: Not on file  Other Topics Concern   Not on file  Social History Narrative   Lives alone, suffers from depression since losing her husband. Son, Zell takes her to appointments and helps her a lot.   Social Drivers of Corporate investment banker Strain: Low Risk  (04/07/2024)   Overall Financial Resource Strain (CARDIA)    Difficulty of Paying Living Expenses: Not hard at all  Food Insecurity: No Food Insecurity (04/07/2024)   Hunger  Vital Sign    Worried About Running Out of Food in the Last Year: Never true    Ran Out of Food in the Last Year: Never true  Transportation Needs: No Transportation Needs (04/07/2024)   PRAPARE - Administrator, Civil Service (Medical): No    Lack of Transportation (Non-Medical): No  Physical Activity: Insufficiently Active (04/07/2024)   Exercise Vital Sign    Days of Exercise per Week: 3 days    Minutes of Exercise per Session: 30 min  Stress: No Stress Concern Present (04/07/2024)   [employer] of Occupational Health - Occupational Stress Questionnaire    Feeling of Stress: Not at all  Social Connections: Socially Isolated (04/07/2024)   Social Connection and Isolation Panel    Frequency of Communication with Friends and Family: More than three times a week    Frequency of Social Gatherings with Friends and Family: More than three times a week    Attends Religious Services: Never    Database administrator or Organizations: No    Attends Banker Meetings: Never    Marital Status: Widowed    Tobacco Counseling Counseling given: Yes    Clinical Intake:  Pre-visit preparation completed: Yes  Pain : No/denies pain     BMI - recorded: 23.17 Nutritional Status: BMI of 19-24  Normal Nutritional Risks: None Diabetes: Yes CBG done?: No  Lab Results  Component Value Date   HGBA1C 7.1 04/26/2022   HGBA1C 9.6 (H) 10/12/2021   HGBA1C 10.1 (H) 03/16/2021     How often do you need to have someone help you when you read instructions, pamphlets, or other written materials from your doctor or pharmacy?: 1 - Never  Interpreter Needed?: No  Information entered by :: alia t/cma   Activities of Daily Living     04/07/2024   10:59 AM  In your present state of health, do you have any difficulty performing the following activities:  Hearing? 0  Vision? 0  Difficulty concentrating or making decisions? 0  Walking or climbing stairs? 0  Dressing or  bathing? 0  Doing errands, shopping? 1  Comment pt's children/daughter-in-law  Preparing Food and eating ?  N  Using the Toilet? N  In the past six months, have you accidently leaked urine? N  Do you have problems with loss of bowel control? N  Managing your Medications? N  Managing your Finances? Y  Comment pts daughter  Housekeeping or managing your Housekeeping? N    Patient Care Team: Severa [name], FNP as PCP - General (Family Medicine) [name], Toribio SAUNDERS, MD (Cardiology) Faythe Purchase, MD as Attending Physician (Internal Medicine) Rachele Gaynell RAMAN, MD as Consulting Physician (Nephrology) Alvia Norleen BIRCH, MD as Consulting Physician (Ophthalmology)  I have updated your Care Teams any recent Medical Services you may have received from other providers in the past year.     Assessment:   This is a routine wellness examination for Bridget Martinez.  Hearing/Vision screen Hearing Screening - Comments:: Pt denies hearing dif Vision Screening - Comments:: Pt denies vision dif/pt goes to Dr. Donnice in [place] ov been a couple yrs   Goals Addressed             This Visit's Progress    Exercise 3x per week (30 min per time)   On track    04/03/2022  AWV Goal: Exercise for General Health  Patient will verbalize understanding of the benefits of increased physical activity: Exercising regularly is important. It will improve your overall fitness, flexibility, and endurance. Regular exercise also will improve your overall health. It can help you control your weight, reduce stress, and improve your bone density. Over the next year, patient will increase physical activity as tolerated with a goal of at least 150 minutes of moderate physical activity per week.  You can tell that you are exercising at a moderate intensity if your heart starts beating faster and you start breathing faster but can still hold a conversation. Moderate-intensity exercise ideas include: Walking 1  mile (1.6 km) in about 15 minutes Biking Hiking Golfing Dancing Water  aerobics Patient will verbalize understanding of everyday activities that increase physical activity by providing examples like the following: Yard work, such as: Insurance underwriter Gardening Washing windows or floors Patient will be able to explain general safety guidelines for exercising:  Before you start a new exercise program, talk with your health care provider. Do not exercise so much that you hurt yourself, feel dizzy, or get very short of breath. Wear comfortable clothes and wear shoes with good support. Drink plenty of water  while you exercise to prevent dehydration or heat stroke. Work out until your breathing and your heartbeat get faster.        Depression Screen     04/07/2024   11:06 AM 03/18/2024    1:07 PM 11/21/2023    9:08 AM 07/28/2023    2:27 PM 07/14/2023    2:27 PM 07/04/2023    2:02 PM 04/07/2023   12:11 PM  PHQ 2/9 Scores  PHQ - 2 Score 0 0 2 0 2 1 0  PHQ- 9 Score   4 0 6 5     Fall Risk     04/07/2024   10:56 AM 11/21/2023    9:08 AM 07/28/2023    2:27 PM 07/14/2023    2:26 PM 07/04/2023    2:01 PM  Fall Risk   Falls in the past year? 0 1 0 0 0  Number falls in past yr: 0 0 0 0   Injury with Fall? 0 0 0 0   Risk for  fall due to : No Fall Risks History of fall(s)     Follow up Falls evaluation completed Falls evaluation completed   Falls prevention discussed    MEDICARE RISK AT HOME:  Medicare Risk at Home Any stairs in or around the home?: Yes If so, are there any without handrails?: Yes Home free of loose throw rugs in walkways, pet beds, electrical cords, etc?: Yes Adequate lighting in your home to reduce risk of falls?: Yes Life alert?: No Use of a cane, walker or w/c?: No Grab bars in the bathroom?: No Shower chair or bench in shower?: Yes Elevated toilet seat or a handicapped  toilet?: Yes  TIMED UP AND GO:  Was the test performed?  no  Cognitive Function: 6CIT completed        04/07/2024   11:06 AM 04/07/2024   11:01 AM 04/07/2023   12:13 PM 02/16/2020    8:46 AM 01/21/2019    8:53 AM  6CIT Screen  What Year? 0 points 0 points 0 points 0 points   What month? 0 points 0 points 0 points 0 points   What time? 0 points 0 points 0 points 0 points   Count back from 20 0 points 0 points 0 points 0 points 2 points  Months in reverse 0 points 0 points 0 points 0 points 0 points  Repeat phrase 0 points 8 points 0 points 2 points 0 points  Total Score 0 points 8 points 0 points 2 points     Immunizations Immunization History  Administered Date(s) Administered   Fluad Quad(high Dose 65+) 06/07/2022   Fluad Trivalent(High Dose 65+) 07/04/2023   Influenza Split 07/27/2011, 07/29/2013, 06/13/2014   Influenza, High Dose Seasonal PF 05/23/2015, 08/14/2018, 06/23/2020, 07/20/2021   Influenza,inj,Quad PF,6+ Mos 05/26/2012, 06/07/2016   Influenza-Unspecified 06/07/2015   Moderna Sars-Covid-2 Vaccination 02/02/2020, 03/03/2020   PNEUMOCOCCAL CONJUGATE-20 10/12/2021   Pneumococcal Conjugate-13 05/23/2015, 10/06/2018   Pneumococcal Polysaccharide-23 07/10/2012   Pneumococcal-Unspecified 06/07/2015   Tdap 10/06/2018   Zoster Recombinant(Shingrix ) 06/07/2022, 09/13/2022    Screening Tests Health Maintenance  Topic Date Due   OPHTHALMOLOGY EXAM  10/14/2019   COVID-19 Vaccine (3 - Moderna risk series) 03/31/2020   HEMOGLOBIN A1C  10/27/2022   Diabetic kidney evaluation - Urine ACR  01/19/2023   DEXA SCAN  07/03/2024 (Originally 05/16/2015)   MAMMOGRAM  11/20/2024 (Originally 04/20/2021)   Colonoscopy  11/20/2024 (Originally 11/19/2023)   INFLUENZA VACCINE  04/09/2024   Diabetic kidney evaluation - eGFR measurement  03/04/2025   Medicare Annual Wellness (AWV)  04/07/2025   DTaP/Tdap/Td (2 - Td or Tdap) 10/06/2028   Pneumococcal Vaccine: 50+ Years  Completed    Hepatitis C Screening  Completed   Zoster Vaccines- Shingrix   Completed   Hepatitis B Vaccines  Aged Out   HPV VACCINES  Aged Out   Meningococcal B Vaccine  Aged Out   FOOT EXAM  Discontinued    Health Maintenance  Health Maintenance Due  Topic Date Due   OPHTHALMOLOGY EXAM  10/14/2019   COVID-19 Vaccine (3 - Moderna risk series) 03/31/2020   HEMOGLOBIN A1C  10/27/2022   Diabetic kidney evaluation - Urine ACR  01/19/2023   Health Maintenance Items Addressed: See Nurse Notes at the end of this note  Additional Screening:  Vision Screening: Recommended annual ophthalmology exams for early detection of glaucoma and other disorders of the eye. Would you like a referral to an eye doctor? No    Dental Screening: Recommended annual dental exams for proper oral  hygiene  Community Resource Referral / Chronic Care Management: CRR required this visit?  No   CCM required this visit?  No   Plan:    I have personally reviewed and noted the following in the patient's chart:   Medical and social history Use of alcohol, tobacco or illicit drugs  Current medications and supplements including opioid prescriptions. Patient is not currently taking opioid prescriptions. Functional ability and status Nutritional status Physical activity Advanced directives List of other physicians Hospitalizations, surgeries, and ER visits in previous 12 months Vitals Screenings to include cognitive, depression, and falls Referrals and appointments  In addition, I have reviewed and discussed with patient certain preventive protocols, quality metrics, and best practice recommendations. A written personalized care plan for preventive services as well as general preventive health recommendations were provided to patient.   Ozie Ned, CMA   04/07/2024   After Visit Summary: (Declined) Due to this being a telephonic visit, with patients personalized plan was offered to patient but patient Declined AVS  at this time   Notes: Please refer to Routing Comments.

## 2024-04-09 DIAGNOSIS — E1042 Type 1 diabetes mellitus with diabetic polyneuropathy: Secondary | ICD-10-CM | POA: Diagnosis not present

## 2024-04-09 DIAGNOSIS — Z794 Long term (current) use of insulin: Secondary | ICD-10-CM | POA: Diagnosis not present

## 2024-04-09 DIAGNOSIS — E039 Hypothyroidism, unspecified: Secondary | ICD-10-CM | POA: Diagnosis not present

## 2024-04-09 DIAGNOSIS — E1065 Type 1 diabetes mellitus with hyperglycemia: Secondary | ICD-10-CM | POA: Diagnosis not present

## 2024-04-09 DIAGNOSIS — E1043 Type 1 diabetes mellitus with diabetic autonomic (poly)neuropathy: Secondary | ICD-10-CM | POA: Diagnosis not present

## 2024-04-15 ENCOUNTER — Inpatient Hospital Stay

## 2024-04-16 ENCOUNTER — Inpatient Hospital Stay

## 2024-04-16 ENCOUNTER — Ambulatory Visit

## 2024-04-19 ENCOUNTER — Inpatient Hospital Stay

## 2024-04-19 ENCOUNTER — Inpatient Hospital Stay: Attending: Hematology

## 2024-04-19 VITALS — BP 144/45 | HR 63 | Temp 97.6°F | Resp 16

## 2024-04-19 DIAGNOSIS — N184 Chronic kidney disease, stage 4 (severe): Secondary | ICD-10-CM | POA: Insufficient documentation

## 2024-04-19 DIAGNOSIS — D631 Anemia in chronic kidney disease: Secondary | ICD-10-CM | POA: Diagnosis not present

## 2024-04-19 LAB — CBC
HCT: 31.4 % — ABNORMAL LOW (ref 36.0–46.0)
Hemoglobin: 10.2 g/dL — ABNORMAL LOW (ref 12.0–15.0)
MCH: 29 pg (ref 26.0–34.0)
MCHC: 32.5 g/dL (ref 30.0–36.0)
MCV: 89.2 fL (ref 80.0–100.0)
Platelets: 235 10*3/uL (ref 150–400)
RBC: 3.52 MIL/uL — ABNORMAL LOW (ref 3.87–5.11)
RDW: 19.7 % — ABNORMAL HIGH (ref 11.5–15.5)
WBC: 7.6 10*3/uL (ref 4.0–10.5)
nRBC: 0 % (ref 0.0–0.2)

## 2024-04-19 MED ORDER — EPOETIN ALFA-EPBX 20000 UNIT/ML IJ SOLN
20000.0000 [IU] | Freq: Once | INTRAMUSCULAR | Status: AC
  Administered 2024-04-19 (×2): 20000 [IU] via SUBCUTANEOUS
  Filled 2024-04-19: qty 1

## 2024-04-19 NOTE — Progress Notes (Signed)
 Patient's Hgb 10.2 and blood pressure stable. Patient tolerated Retacrit  injection with no complaints voiced.  Site clean and dry with no bruising or swelling noted at site.  See MAR for details.  Band aid applied.  Patient stable during and after injection.  Vss with discharge and left in satisfactory condition with no s/s of distress noted. All follow ups as scheduled.   Bridget Martinez [employer]

## 2024-04-19 NOTE — Patient Instructions (Signed)

## 2024-04-29 ENCOUNTER — Inpatient Hospital Stay

## 2024-04-30 ENCOUNTER — Other Ambulatory Visit

## 2024-04-30 ENCOUNTER — Inpatient Hospital Stay

## 2024-05-01 ENCOUNTER — Observation Stay (HOSPITAL_COMMUNITY)
Admission: EM | Admit: 2024-05-01 | Discharge: 2024-05-05 | Disposition: A | Discharge status: Home / Self Care | Attending: Internal Medicine | Admitting: Internal Medicine

## 2024-05-01 ENCOUNTER — Observation Stay (HOSPITAL_COMMUNITY)

## 2024-05-01 ENCOUNTER — Emergency Department (HOSPITAL_COMMUNITY)

## 2024-05-01 ENCOUNTER — Other Ambulatory Visit: Payer: Self-pay

## 2024-05-01 DIAGNOSIS — Y92009 Unspecified place in unspecified non-institutional (private) residence as the place of occurrence of the external cause: Secondary | ICD-10-CM | POA: Diagnosis not present

## 2024-05-01 DIAGNOSIS — N184 Chronic kidney disease, stage 4 (severe): Secondary | ICD-10-CM | POA: Diagnosis present

## 2024-05-01 DIAGNOSIS — S2232XA Fracture of one rib, left side, initial encounter for closed fracture: Secondary | ICD-10-CM | POA: Diagnosis not present

## 2024-05-01 DIAGNOSIS — S22058A Other fracture of T5-T6 vertebra, initial encounter for closed fracture: Secondary | ICD-10-CM | POA: Insufficient documentation

## 2024-05-01 DIAGNOSIS — S32592A Other specified fracture of left pubis, initial encounter for closed fracture: Principal | ICD-10-CM | POA: Insufficient documentation

## 2024-05-01 DIAGNOSIS — W010XXA Fall on same level from slipping, tripping and stumbling without subsequent striking against object, initial encounter: Secondary | ICD-10-CM | POA: Diagnosis not present

## 2024-05-01 DIAGNOSIS — Z7901 Long term (current) use of anticoagulants: Secondary | ICD-10-CM | POA: Diagnosis not present

## 2024-05-01 DIAGNOSIS — I6523 Occlusion and stenosis of bilateral carotid arteries: Secondary | ICD-10-CM | POA: Diagnosis not present

## 2024-05-01 DIAGNOSIS — I2510 Atherosclerotic heart disease of native coronary artery without angina pectoris: Secondary | ICD-10-CM | POA: Diagnosis not present

## 2024-05-01 DIAGNOSIS — E1159 Type 2 diabetes mellitus with other circulatory complications: Secondary | ICD-10-CM | POA: Diagnosis present

## 2024-05-01 DIAGNOSIS — Z8679 Personal history of other diseases of the circulatory system: Secondary | ICD-10-CM | POA: Diagnosis not present

## 2024-05-01 DIAGNOSIS — D631 Anemia in chronic kidney disease: Secondary | ICD-10-CM | POA: Diagnosis not present

## 2024-05-01 DIAGNOSIS — M545 Low back pain, unspecified: Secondary | ICD-10-CM | POA: Diagnosis not present

## 2024-05-01 DIAGNOSIS — E1065 Type 1 diabetes mellitus with hyperglycemia: Secondary | ICD-10-CM | POA: Diagnosis present

## 2024-05-01 DIAGNOSIS — I130 Hypertensive heart and chronic kidney disease with heart failure and stage 1 through stage 4 chronic kidney disease, or unspecified chronic kidney disease: Secondary | ICD-10-CM | POA: Insufficient documentation

## 2024-05-01 DIAGNOSIS — Z9181 History of falling: Secondary | ICD-10-CM | POA: Diagnosis not present

## 2024-05-01 DIAGNOSIS — G9389 Other specified disorders of brain: Secondary | ICD-10-CM | POA: Diagnosis not present

## 2024-05-01 DIAGNOSIS — E039 Hypothyroidism, unspecified: Secondary | ICD-10-CM | POA: Diagnosis not present

## 2024-05-01 DIAGNOSIS — R2689 Other abnormalities of gait and mobility: Secondary | ICD-10-CM | POA: Insufficient documentation

## 2024-05-01 DIAGNOSIS — M4187 Other forms of scoliosis, lumbosacral region: Secondary | ICD-10-CM | POA: Diagnosis not present

## 2024-05-01 DIAGNOSIS — R2681 Unsteadiness on feet: Secondary | ICD-10-CM | POA: Insufficient documentation

## 2024-05-01 DIAGNOSIS — I5042 Chronic combined systolic (congestive) and diastolic (congestive) heart failure: Secondary | ICD-10-CM | POA: Diagnosis not present

## 2024-05-01 DIAGNOSIS — N179 Acute kidney failure, unspecified: Secondary | ICD-10-CM | POA: Insufficient documentation

## 2024-05-01 DIAGNOSIS — N390 Urinary tract infection, site not specified: Secondary | ICD-10-CM | POA: Diagnosis not present

## 2024-05-01 DIAGNOSIS — I152 Hypertension secondary to endocrine disorders: Secondary | ICD-10-CM | POA: Diagnosis present

## 2024-05-01 DIAGNOSIS — Z794 Long term (current) use of insulin: Secondary | ICD-10-CM | POA: Diagnosis not present

## 2024-05-01 DIAGNOSIS — N189 Chronic kidney disease, unspecified: Secondary | ICD-10-CM | POA: Diagnosis present

## 2024-05-01 DIAGNOSIS — Z8611 Personal history of tuberculosis: Secondary | ICD-10-CM | POA: Diagnosis present

## 2024-05-01 DIAGNOSIS — K219 Gastro-esophageal reflux disease without esophagitis: Secondary | ICD-10-CM | POA: Diagnosis present

## 2024-05-01 DIAGNOSIS — S32502A Unspecified fracture of left pubis, initial encounter for closed fracture: Secondary | ICD-10-CM

## 2024-05-01 DIAGNOSIS — L89159 Pressure ulcer of sacral region, unspecified stage: Secondary | ICD-10-CM | POA: Insufficient documentation

## 2024-05-01 DIAGNOSIS — I700 Atherosclerosis of aorta: Secondary | ICD-10-CM | POA: Diagnosis not present

## 2024-05-01 DIAGNOSIS — E1122 Type 2 diabetes mellitus with diabetic chronic kidney disease: Secondary | ICD-10-CM | POA: Insufficient documentation

## 2024-05-01 DIAGNOSIS — R29890 Loss of height: Secondary | ICD-10-CM | POA: Diagnosis not present

## 2024-05-01 DIAGNOSIS — M4804 Spinal stenosis, thoracic region: Secondary | ICD-10-CM | POA: Diagnosis not present

## 2024-05-01 DIAGNOSIS — W19XXXA Unspecified fall, initial encounter: Secondary | ICD-10-CM | POA: Diagnosis not present

## 2024-05-01 DIAGNOSIS — M47814 Spondylosis without myelopathy or radiculopathy, thoracic region: Secondary | ICD-10-CM | POA: Diagnosis not present

## 2024-05-01 LAB — CBC WITH DIFFERENTIAL/PLATELET
Abs Immature Granulocytes: 0.07 K/uL (ref 0.00–0.07)
Basophils Absolute: 0.1 K/uL (ref 0.0–0.1)
Basophils Relative: 1 %
Eosinophils Absolute: 0.2 K/uL (ref 0.0–0.5)
Eosinophils Relative: 2 %
HCT: 30.7 % — ABNORMAL LOW (ref 36.0–46.0)
Hemoglobin: 9.8 g/dL — ABNORMAL LOW (ref 12.0–15.0)
Immature Granulocytes: 1 %
Lymphocytes Relative: 7 %
Lymphs Abs: 0.8 K/uL (ref 0.7–4.0)
MCH: 28.6 pg (ref 26.0–34.0)
MCHC: 31.9 g/dL (ref 30.0–36.0)
MCV: 89.5 fL (ref 80.0–100.0)
Monocytes Absolute: 1.3 K/uL — ABNORMAL HIGH (ref 0.1–1.0)
Monocytes Relative: 12 %
Neutro Abs: 8.9 K/uL — ABNORMAL HIGH (ref 1.7–7.7)
Neutrophils Relative %: 77 %
Platelets: 212 K/uL (ref 150–400)
RBC: 3.43 MIL/uL — ABNORMAL LOW (ref 3.87–5.11)
RDW: 20.2 % — ABNORMAL HIGH (ref 11.5–15.5)
WBC: 11.3 K/uL — ABNORMAL HIGH (ref 4.0–10.5)
nRBC: 0 % (ref 0.0–0.2)

## 2024-05-01 LAB — GLUCOSE, CAPILLARY: Glucose-Capillary: 311 mg/dL — ABNORMAL HIGH (ref 70–99)

## 2024-05-01 LAB — BASIC METABOLIC PANEL WITH GFR
Anion gap: 11 (ref 5–15)
BUN: 51 mg/dL — ABNORMAL HIGH (ref 8–23)
CO2: 23 mmol/L (ref 22–32)
Calcium: 8.9 mg/dL (ref 8.9–10.3)
Chloride: 104 mmol/L (ref 98–111)
Creatinine, Ser: 2.19 mg/dL — ABNORMAL HIGH (ref 0.44–1.00)
GFR, Estimated: 23 mL/min — ABNORMAL LOW (ref >60)
Glucose, Bld: 189 mg/dL — ABNORMAL HIGH (ref 70–99)
Potassium: 3.7 mmol/L (ref 3.5–5.1)
Sodium: 138 mmol/L (ref 135–145)

## 2024-05-01 LAB — HEPATIC FUNCTION PANEL
ALT: 18 U/L (ref 0–44)
AST: 18 U/L (ref 15–41)
Albumin: 3 g/dL — ABNORMAL LOW (ref 3.5–5.0)
Alkaline Phosphatase: 84 U/L (ref 38–126)
Bilirubin, Direct: 0.2 mg/dL (ref 0.0–0.2)
Indirect Bilirubin: 0.8 mg/dL (ref 0.3–0.9)
Total Bilirubin: 1 mg/dL (ref 0.0–1.2)
Total Protein: 6.4 g/dL — ABNORMAL LOW (ref 6.5–8.1)

## 2024-05-01 LAB — TYPE AND SCREEN
ABO/RH(D): B POS
Antibody Screen: NEGATIVE

## 2024-05-01 LAB — FERRITIN: Ferritin: 735 ng/mL — ABNORMAL HIGH (ref 11–307)

## 2024-05-01 LAB — IRON AND TIBC
Iron: 22 ug/dL — ABNORMAL LOW (ref 28–170)
Saturation Ratios: 10 % — ABNORMAL LOW (ref 10.4–31.8)
TIBC: 217 ug/dL — ABNORMAL LOW (ref 250–450)
UIBC: 195 ug/dL

## 2024-05-01 LAB — MAGNESIUM: Magnesium: 1.9 mg/dL (ref 1.7–2.4)

## 2024-05-01 LAB — RETICULOCYTES
Immature Retic Fract: 9.8 % (ref 2.3–15.9)
RBC.: 3.33 MIL/uL — ABNORMAL LOW (ref 3.87–5.11)
Retic Count, Absolute: 35.3 K/uL (ref 19.0–186.0)
Retic Ct Pct: 1.1 % (ref 0.4–3.1)

## 2024-05-01 LAB — HEMOGLOBIN A1C
Hgb A1c MFr Bld: 8 % — ABNORMAL HIGH (ref 4.8–5.6)
Mean Plasma Glucose: 182.9 mg/dL

## 2024-05-01 MED ORDER — MORPHINE SULFATE (PF) 2 MG/ML IV SOLN
1.0000 mg | INTRAVENOUS | Status: AC | PRN
  Administered 2024-05-02: 1 mg via INTRAVENOUS
  Filled 2024-05-01: qty 1

## 2024-05-01 MED ORDER — PANTOPRAZOLE SODIUM 40 MG IV SOLR
40.0000 mg | Freq: Two times a day (BID) | INTRAVENOUS | Status: DC
  Administered 2024-05-02: 40 mg via INTRAVENOUS
  Filled 2024-05-01 (×2): qty 10

## 2024-05-01 MED ORDER — INSULIN ASPART 100 UNIT/ML IJ SOLN
6.0000 [IU] | Freq: Once | INTRAMUSCULAR | Status: AC
  Administered 2024-05-01: 6 [IU] via SUBCUTANEOUS

## 2024-05-01 MED ORDER — ONDANSETRON 4 MG PO TBDP
4.0000 mg | ORAL_TABLET | Freq: Once | ORAL | Status: AC
  Administered 2024-05-01: 4 mg via ORAL
  Filled 2024-05-01: qty 1

## 2024-05-01 MED ORDER — SODIUM CHLORIDE 0.9 % IV SOLN: 250.0000 mL | INTRAVENOUS | Status: AC | PRN

## 2024-05-01 MED ORDER — ACETAMINOPHEN 650 MG RE SUPP: 650.0000 mg | Freq: Four times a day (QID) | RECTAL | Status: DC | PRN

## 2024-05-01 MED ORDER — HYDROCODONE-ACETAMINOPHEN 5-325 MG PO TABS
1.0000 | ORAL_TABLET | Freq: Once | ORAL | Status: AC
  Administered 2024-05-01: 1 via ORAL
  Filled 2024-05-01: qty 1

## 2024-05-01 MED ORDER — SODIUM CHLORIDE 0.9% FLUSH: 3.0000 mL | Freq: Two times a day (BID) | INTRAVENOUS | Status: DC

## 2024-05-01 MED ORDER — SODIUM CHLORIDE 0.9% FLUSH: 3.0000 mL | INTRAVENOUS | Status: DC | PRN

## 2024-05-01 MED ORDER — ASPIRIN 81 MG PO TBEC
81.0000 mg | DELAYED_RELEASE_TABLET | Freq: Every day | ORAL | Status: DC
  Administered 2024-05-02 – 2024-05-05 (×4): 81 mg via ORAL
  Filled 2024-05-01 (×4): qty 1

## 2024-05-01 MED ORDER — SODIUM CHLORIDE 0.9% FLUSH
3.0000 mL | Freq: Two times a day (BID) | INTRAVENOUS | Status: DC
  Administered 2024-05-02 – 2024-05-04 (×4): 3 mL via INTRAVENOUS

## 2024-05-01 MED ORDER — LEVOTHYROXINE SODIUM 75 MCG PO TABS
75.0000 ug | ORAL_TABLET | Freq: Every day | ORAL | Status: DC
  Administered 2024-05-02 – 2024-05-05 (×4): 75 ug via ORAL
  Filled 2024-05-01 (×4): qty 1

## 2024-05-01 MED ORDER — ACETAMINOPHEN 325 MG PO TABS
650.0000 mg | ORAL_TABLET | Freq: Four times a day (QID) | ORAL | Status: DC | PRN
  Administered 2024-05-02 – 2024-05-04 (×2): 650 mg via ORAL
  Filled 2024-05-01 (×2): qty 2

## 2024-05-01 MED ORDER — BUPROPION HCL ER (XL) 150 MG PO TB24
150.0000 mg | ORAL_TABLET | Freq: Two times a day (BID) | ORAL | Status: DC
  Administered 2024-05-01 – 2024-05-05 (×8): 150 mg via ORAL
  Filled 2024-05-01 (×8): qty 1

## 2024-05-01 MED ORDER — HEPARIN SODIUM (PORCINE) 5000 UNIT/ML IJ SOLN
5000.0000 [IU] | Freq: Three times a day (TID) | INTRAMUSCULAR | Status: DC
  Administered 2024-05-01 – 2024-05-05 (×11): 5000 [IU] via SUBCUTANEOUS
  Filled 2024-05-01 (×11): qty 1

## 2024-05-01 MED ORDER — INSULIN ASPART 100 UNIT/ML IJ SOLN
0.0000 [IU] | Freq: Three times a day (TID) | INTRAMUSCULAR | Status: DC
  Administered 2024-05-02: 3 [IU] via SUBCUTANEOUS
  Administered 2024-05-02 (×2): 5 [IU] via SUBCUTANEOUS
  Administered 2024-05-03: 3 [IU] via SUBCUTANEOUS
  Administered 2024-05-03 (×2): 5 [IU] via SUBCUTANEOUS
  Administered 2024-05-04: 8 [IU] via SUBCUTANEOUS
  Administered 2024-05-04 (×2): 5 [IU] via SUBCUTANEOUS
  Administered 2024-05-05: 3 [IU] via SUBCUTANEOUS

## 2024-05-01 NOTE — ED Notes (Signed)
 Scanner at bedside broken.

## 2024-05-01 NOTE — ED Notes (Addendum)
 Pt was unable to ambulate- pt cried out in pain and requested providers to stop and let her remain in bed. Provider notified via secure chat.

## 2024-05-01 NOTE — ED Notes (Signed)
 Patient transported to CT

## 2024-05-01 NOTE — ED Provider Notes (Addendum)
 [place] EMERGENCY DEPARTMENT AT [facility] Provider Note   CSN: [id number] Arrival date & time: 05/01/24  1038     Patient presents with: [place]   Bridget Martinez is a 73 y.o. female.   HPI   73 year old female presents emergency department with mid to low back pain.  Patient states 5 days ago on Monday she had a mechanical fall at her home.  She states that she slipped on wet floor and fell down onto her bottom.  Did not hit her head, no LOC or syncope.  She was slightly sore however starting yesterday she has been having worsening lower back pain.  She is now having to use a walker secondary to pain and difficulty walking.  Denies any other traumatic injury.  Has otherwise been in her baseline health and compliant.  Prior to Admission medications   Medication Sig Start Date End Date Taking? Authorizing Provider  aspirin  81 MG EC tablet Take 1 tablet (81 mg total) by mouth daily. 12/13/16   [name], Toribio SAUNDERS, MD  buPROPion  (WELLBUTRIN  XL) 150 MG 24 hr tablet Take 1 tablet (150 mg total) by mouth 2 (two) times daily. 11/21/23   Severa [name], FNP  carvedilol  (COREG ) 12.5 MG tablet TAKE 1 TABLET (12.5MG  TOTAL) BY MOUTH TWICE A DAY WITH MEALS 09/26/23   [name], Toribio SAUNDERS, MD  Continuous Blood Gluc Receiver (DEXCOM G6 RECEIVER) DEVI See admin instructions. 06/01/20   [provider]  Continuous Blood Gluc Sensor (DEXCOM G6 SENSOR) MISC See admin instructions. 06/01/20   [provider]  epoetin  alfa (EPOGEN ) 3000 UNIT/ML injection Inject into the skin. 02/20/22   [provider]  ferrous sulfate  325 (65 FE) MG EC tablet Take 1 tablet (325 mg total) by mouth every other day. 08/11/23   Kandala, Hyndavi, MD  furosemide  (LASIX ) 20 MG tablet Take 20 mg by mouth 3 (three) times a week. Per pt 04/07/24 taking 20mg  by mouth 2 times/week 12/12/23   [provider]  insulin  aspart (NOVOLOG  FLEXPEN) 100 UNIT/ML FlexPen 13 to 17 units at breakfast, 7 to 11  units at lunch and at supper 09/21/19   [provider]  insulin  glargine (LANTUS ) 100 UNIT/ML injection Inject into the skin. 18 units AM & 12 units PM    [provider]  LANTUS  SOLOSTAR 100 UNIT/ML Solostar Pen INJECT 20 UNITS EACH MORNING AND 20 UNITS EACH EVENING SUBCUTANEOUS TWICE A DAY 30 DAYS 37 09/23/23   [provider]  levothyroxine  (SYNTHROID ) 75 MCG tablet 1 tablet on an empty stomach every morning Orally Once a day for 30 days    [provider]  metoCLOPramide  (REGLAN ) 5 MG tablet TAKE 1 TABLET BY MOUTH 3 TIMES DAILY BEFORE MEALS. 02/04/24   Severa [name], FNP  mirtazapine  (REMERON ) 15 MG tablet Take 1 tablet (15 mg total) by mouth at bedtime. 11/21/23   Severa [name], FNP  ondansetron  (ZOFRAN ) 4 MG tablet Take 4 mg by mouth every 8 (eight) hours as needed. 10/03/23   [provider]  pantoprazole  (PROTONIX ) 40 MG tablet Take 1 tablet (40 mg total) by mouth daily. 11/21/23   Severa [name], FNP  potassium chloride  (KLOR-CON ) 20 MEQ packet Take by mouth 2 (two) times daily.    [provider]  rosuvastatin  (CRESTOR ) 40 MG tablet Take 1 tablet (40 mg total) by mouth daily. 11/21/23   Severa [name], FNP  sodium bicarbonate  650 MG tablet Take 650 mg by mouth 3 (  three) times daily. 02/16/22   [provider]    Allergies: Penicillins, Sulfa antibiotics, Ciprofloxacin , Macrobid  [nitrofurantoin  monohyd macro], Other, and Ciprofloxacin  hcl    Review of Systems  Constitutional:  Negative for fever.  Respiratory:  Negative for shortness of breath.   Cardiovascular:  Negative for chest pain.  Gastrointestinal:  Negative for abdominal pain, diarrhea and vomiting.  Musculoskeletal:  Positive for back pain.  Skin:  Negative for rash.  Neurological:  Negative for headaches.    Updated Vital Signs BP (!) 126/45 (BP Location: Right Arm)   Pulse 67   Temp 98.6 F (37 C) (Oral)   Resp 15   SpO2 97%   Physical Exam Vitals and  nursing note reviewed.  Constitutional:      General: She is not in acute distress.    Appearance: Normal appearance.  HENT:     Head: Normocephalic.     Mouth/Throat:     Mouth: Mucous membranes are moist.  Cardiovascular:     Rate and Rhythm: Normal rate.  Pulmonary:     Effort: Pulmonary effort is normal. No respiratory distress.  Abdominal:     Palpations: Abdomen is soft.     Tenderness: There is no abdominal tenderness.  Musculoskeletal:     Comments: No midline cervical spine tenderness.  Lower Lumbar midline spinal tenderness to palpation, no deformity/step-off, bilateral paraspinal musculature tenderness to palpation extending into the SI joints, equal strength and sensation in the lower extremities  Skin:    General: Skin is warm.  Neurological:     Mental Status: She is alert and oriented to person, place, and time. Mental status is at baseline.  Psychiatric:        Mood and Affect: Mood normal.     (all labs ordered are listed, but only abnormal results are displayed) Labs Reviewed  CBC WITH DIFFERENTIAL/PLATELET  BASIC METABOLIC PANEL WITH GFR    EKG: None  Radiology: No results found.   Procedures   Medications Ordered in the ED - No data to display                                  Medical Decision Making Amount and/or Complexity of Data Reviewed Labs: ordered. Radiology: ordered.  Risk Prescription drug management.   73 year old female presents emergency department after mechanical fall down onto her bottom 5 days ago.  Now complaining of lower back pain.  Denies any other injury or concern.  Vitals are normal and stable.  She does have some midline spinal tenderness in lumbar but otherwise no other acute traumatic findings on exam.  CT/x-ray imaging shows chronic L2 compression fracture, compression fracture of T5 unclear acuity, posterior left 10th rib fracture that appears acute as well as a left inferior pubic ramus fracture, again  indeterminate age.  On reevaluation patient has some slight tenderness in regards to the rib fracture, no significant tenderness around the T5 area.  Pelvis is stable, no significant pain with movement of the lower extremities.  Seems like the lumbar compression fracture is more chronic, question if the inferior pubic ramus fracture is acute and otherwise the left rib fracture is slightly tender.  Will plan for pain control and ambulation.    Patient did not tolerate ambulation.  Still complaining of lower back/buttocks pain, most likely in relation to the inferior pubic ramus fracture.  Given his injury in combination with the rib fracture and the  compression fractures in the back feel she would benefit most from admission, pain control and nonurgent consultation as well as PT evaluation.  Patient signed out pending admission call.     Final diagnoses:  None    ED Discharge Orders     None          Bari Roxie HERO, DO 05/01/24 1528    Bari Roxie HERO, DO 05/01/24 1545

## 2024-05-01 NOTE — H&P (Signed)
 History and Physical    Patient: Bridget Martinez FMW:[id number] DOB: 08-07-1951 DOA: 05/01/2024 DOS: the patient was seen and examined on 05/01/2024 . PCP: Severa [name], FNP  Patient coming from: Home Chief complaint: Chief Complaint  Patient presents with   Fall   HPI:  Bridget Martinez is a 73 y.o. female with past medical history  of anemia being followed by cancer center at [facility] patient received a Retacrit  injection on 19 April 2024, essential hypertension, diabetes mellitus, acquired hypothyroidism being admitted today for a fall and pain.  Per report patient had a mechanical fall where she on Monday, 18 August and fell and slipped on a wet floor on her bottom, no reports of loss of consciousness and started having pain yesterday more so on her lower back with difficulty walking and unable to bear weight because of the pain patient at baseline uses a walker.  No reports of numbness tingling paresthesia or incontinence or bandlike pain.  No reports of weakness.  Of note patient also has past medical history of allergies to penicillin, sulfa, ciprofloxacin , Macrobid .  Admission requested for pain control and physical therapy.   ED Course:  Vital signs in the ED were notable for the following:  Vitals:   05/01/24 1433 05/01/24 1800 05/01/24 1838 05/01/24 1852  BP: 124/60 (!) 131/46  (!) 140/53  Pulse: 63 70  79  Temp: 97.6 F (36.4 C)  98.3 F (36.8 C) 98.2 F (36.8 C)  Resp: 14   16  SpO2: 98% 98%  98%  TempSrc: Axillary  Oral Oral  >>ED evaluation thus far shows: - BMP shows glucose of 189 BUN of 51 creatinine of 2.19 EGFR of 23. -LFTs added on. -EKG ordered. -CBC shows white count of 11.3 anemia with a hemoglobin of 9.8 normal MCV platelets of 212. -X-ray of the lumbar spine shows no acute lumbar spine fracture.  >CT of the thoracic and lumbar spine shows: - Age indeterminant T5 inferior endplate compression fracture, -Dextrocurvature of the T-spine. -Thoracic  spondylosis - An acute displaced fracture of the posterior left 10th rib. -Cardiomegaly -Coronary artery disease. -Aortic atherosclerosis. -Lumbar spondylosis. -Levocurvature of the lumbar spine. -Displaced fracture of the left inferior pubic rami that is new. -Chronic L2 compression fracture.  >>While in the ED patient received the following: Medications  HYDROcodone -acetaminophen  (NORCO/VICODIN) 5-325 MG per tablet 1 tablet (1 tablet Oral Given 05/01/24 1512)  ondansetron  (ZOFRAN -ODT) disintegrating tablet 4 mg (4 mg Oral Given 05/01/24 1617)   Review of Systems  Musculoskeletal:  Positive for back pain, falls and joint pain.  All other systems reviewed and are negative.  Past Medical History:  Diagnosis Date   Arthritis    CHF (congestive heart failure) (HCC)    Coronary artery disease    angina.  MI.    Depression    anxiety   Diabetes mellitus    on insulin , with h/o DKA    GERD (gastroesophageal reflux disease)    Hiatal hernia.    HTN (hypertension)    Hyperlipidemia    Hypothyroidism    Left displaced femoral neck fracture (HCC) 11/19/2014   Myocardial infarction [facility])    NSTEMI 07/2011 with cardiogenic shock, s/p CABG 09/2011   Osteoporosis    Pneumonia        Tuberculosis    Venous stasis ulcers (HCC)    Past Surgical History:  Procedure Laterality Date   CARDIAC CATHETERIZATION     COLONOSCOPY N/A 11/18/2013   Procedure: COLONOSCOPY;  Surgeon: Princella CHRISTELLA Nida, MD;  Location: [facility] ENDOSCOPY;  Service: Endoscopy;  Laterality: N/A;   CORONARY ARTERY BYPASS GRAFT     Dr. Fleeta Trigt in 09/2011    CORONARY ARTERY BYPASS GRAFT  2012   ESOPHAGOGASTRODUODENOSCOPY N/A 11/17/2013   Procedure: ESOPHAGOGASTRODUODENOSCOPY (EGD);  Surgeon: Princella CHRISTELLA Nida, MD;  Location: [facility] ENDOSCOPY;  Service: Endoscopy;  Laterality: N/A;   LEFT HEART CATHETERIZATION WITH CORONARY ANGIOGRAM N/A 07/26/2011   Procedure: LEFT HEART CATHETERIZATION WITH CORONARY ANGIOGRAM;  Surgeon: Ezra GORMAN Shuck,  MD;  Location: [facility] CATH LAB;  Service: Cardiovascular;  Laterality: N/A;   RIGHT HEART CATHETERIZATION N/A 07/29/2011   Procedure: RIGHT HEART CATH;  Surgeon: Lynwood Schilling, MD;  Location: [facility] CATH LAB;  Service: Cardiovascular;  Laterality: N/A;   TONSILLECTOMY     TOTAL HIP ARTHROPLASTY Left 11/21/2014   Procedure: TOTAL HIP ARTHROPLASTY ANTERIOR APPROACH;  Surgeon: Lonni CINDERELLA Poli, MD;  Location: [facility] OR;  Service: Orthopedics;  Laterality: Left;    reports that she has never smoked. She has never used smokeless tobacco. She reports that she does not drink alcohol and does not use drugs. Allergies  Allergen Reactions   Penicillins Anaphylaxis and Hives    Tolerates zosyn  Has patient had a PCN reaction causing immediate rash, facial/tongue/throat swelling, SOB or lightheadedness with hypotension: Yes Has patient had a PCN reaction causing severe rash involving mucus membranes or skin necrosis: No Has patient had a PCN reaction that required hospitalization: Yes Has patient had a PCN reaction occurring within the last 10 years: No If all of the above answers are NO, then may proceed with Cephalosporin use.    Sulfa Antibiotics Anaphylaxis, Other (See Comments) and Swelling    Stiff neck also   Ciprofloxacin  Other (See Comments)   Macrobid  [Nitrofurantoin  Monohyd Macro] Other (See Comments)    Skin peels off   Other Other (See Comments)   Ciprofloxacin  Hcl Other (See Comments)    Skin peeling   Family History  Problem Relation Age of Onset   Heart disease Father    Multiple sclerosis Father    Hypertension Mother    Hyperthyroidism Mother    Diabetes Mother    Heart attack Paternal Grandfather    Heart disease Paternal Grandfather    Diabetes Cousin        Multiple maternal cousins with type 2 diabetes mellitus   Diabetes Maternal Uncle        Type 1 diabetes mellitus   Healthy Daughter    Asthma Son    Diabetes Son    Prior to Admission medications   Medication Sig  Start Date End Date Taking? Authorizing Provider  aspirin  81 MG EC tablet Take 1 tablet (81 mg total) by mouth daily. 12/13/16   [name], Toribio SAUNDERS, MD  buPROPion  (WELLBUTRIN  XL) 150 MG 24 hr tablet Take 1 tablet (150 mg total) by mouth 2 (two) times daily. 11/21/23   Severa Rock CHRISTELLA, FNP  carvedilol  (COREG ) 12.5 MG tablet TAKE 1 TABLET (12.5MG  TOTAL) BY MOUTH TWICE A DAY WITH MEALS 09/26/23   [name], Toribio SAUNDERS, MD  Continuous Blood Gluc Receiver (DEXCOM G6 RECEIVER) DEVI See admin instructions. 06/01/20   [provider]  Continuous Blood Gluc Sensor (DEXCOM G6 SENSOR) MISC See admin instructions. 06/01/20   [provider]  epoetin  alfa (EPOGEN ) 3000 UNIT/ML injection Inject into the skin. 02/20/22   [provider]  ferrous sulfate  325 (65 FE) MG EC tablet Take 1 tablet (325 mg total) by mouth  every other day. 08/11/23   Kandala, Hyndavi, MD  furosemide  (LASIX ) 20 MG tablet Take 20 mg by mouth 3 (three) times a week. Per pt 04/07/24 taking 20mg  by mouth 2 times/week 12/12/23   [provider]  insulin  aspart (NOVOLOG  FLEXPEN) 100 UNIT/ML FlexPen 13 to 17 units at breakfast, 7 to 11 units at lunch and at supper 09/21/19   [provider]  insulin  glargine (LANTUS ) 100 UNIT/ML injection Inject into the skin. 18 units AM & 12 units PM    [provider]  LANTUS  SOLOSTAR 100 UNIT/ML Solostar Pen INJECT 20 UNITS EACH MORNING AND 20 UNITS EACH EVENING SUBCUTANEOUS TWICE A DAY 30 DAYS 37 09/23/23   [provider]  levothyroxine  (SYNTHROID ) 75 MCG tablet 1 tablet on an empty stomach every morning Orally Once a day for 30 days    [provider]  metoCLOPramide  (REGLAN ) 5 MG tablet TAKE 1 TABLET BY MOUTH 3 TIMES DAILY BEFORE MEALS. 02/04/24   Severa [name], FNP  mirtazapine  (REMERON ) 15 MG tablet Take 1 tablet (15 mg total) by mouth at bedtime. 11/21/23   Severa [name], FNP  ondansetron  (ZOFRAN ) 4 MG tablet Take 4 mg by mouth every 8 (eight)  hours as needed. 10/03/23   [provider]  pantoprazole  (PROTONIX ) 40 MG tablet Take 1 tablet (40 mg total) by mouth daily. 11/21/23   Severa [name], FNP  potassium chloride  (KLOR-CON ) 20 MEQ packet Take by mouth 2 (two) times daily.    [provider]  rosuvastatin  (CRESTOR ) 40 MG tablet Take 1 tablet (40 mg total) by mouth daily. 11/21/23   Severa [name], FNP  sodium bicarbonate  650 MG tablet Take 650 mg by mouth 3 (three) times daily. 02/16/22   [provider]                                                                                 Vitals:   05/01/24 1433 05/01/24 1800 05/01/24 1838 05/01/24 1852  BP: 124/60 (!) 131/46  (!) 140/53  Pulse: 63 70  79  Resp: 14   16  Temp: 97.6 F (36.4 C)  98.3 F (36.8 C) 98.2 F (36.8 C)  TempSrc: Axillary  Oral Oral  SpO2: 98% 98%  98%   Physical Exam Vitals reviewed.  Constitutional:      General: She is not in acute distress.    Appearance: She is not ill-appearing.  HENT:     Head: Normocephalic and atraumatic.  Eyes:     Extraocular Movements: Extraocular movements intact.  Cardiovascular:     Rate and Rhythm: Normal rate and regular rhythm.     Pulses: Normal pulses.     Heart sounds: Normal heart sounds.  Pulmonary:     Effort: Pulmonary effort is normal.     Breath sounds: Normal breath sounds.  Abdominal:     General: There is no distension.     Palpations: Abdomen is soft.     Tenderness: There is no abdominal tenderness.  Musculoskeletal:        General: No deformity.     Right lower leg: Edema present.     Left lower leg: Edema  present.  Skin:    Findings: No bruising.  Neurological:     General: No focal deficit present.     Mental Status: She is alert and oriented to person, place, and time.     Labs on Admission: I have personally reviewed following labs and imaging studies CBC: Recent Labs  Lab 05/01/24 1227  WBC 11.3*  NEUTROABS 8.9*  HGB 9.8*  HCT 30.7*  MCV 89.5  PLT  212   Basic Metabolic Panel: Recent Labs  Lab 05/01/24 1227 05/01/24 1744  NA 138  --   K 3.7  --   CL 104  --   CO2 23  --   GLUCOSE 189*  --   BUN 51*  --   CREATININE 2.19*  --   CALCIUM  8.9  --   MG  --  1.9   GFR: CrCl cannot be calculated (Unknown ideal weight.). Liver Function Tests: No results for input(s): AST, ALT, ALKPHOS, BILITOT, PROT, ALBUMIN  in the last 168 hours. No results for input(s): LIPASE, AMYLASE in the last 168 hours. No results for input(s): AMMONIA in the last 168 hours. Recent Labs    08/25/23 1231 10/03/23 1224 10/24/23 1005 11/21/23 0927 12/08/23 1258 03/04/24 1021 05/01/24 1227  BUN 27* 106* 32* 32* 43* 42* 51*  CREATININE 2.48* 2.80* 1.45* 1.53* 2.29* 1.90* 2.19*    CrCl cannot be calculated (Unknown ideal weight.).   Recent Labs    08/25/23 1231 10/03/23 1224 10/24/23 1005 11/21/23 0927 12/08/23 1258 03/04/24 1021 05/01/24 1227  BUN 27* 106* 32* 32* 43* 42* 51*  CREATININE 2.48* 2.80* 1.45* 1.53* 2.29* 1.90* 2.19*  CO2 23 17* 23 21 21* 22 23   Cardiac Enzymes: No results for input(s): CKTOTAL, CKMB, CKMBINDEX, TROPONINI in the last 168 hours. BNP (last 3 results) No results for input(s): PROBNP in the last 8760 hours. HbA1C: No results for input(s): HGBA1C in the last 72 hours. CBG: No results for input(s): GLUCAP in the last 168 hours. Lipid Profile: No results for input(s): CHOL, HDL, LDLCALC, TRIG, CHOLHDL, LDLDIRECT in the last 72 hours. Thyroid  Function Tests: No results for input(s): TSH, T4TOTAL, FREET4, T3FREE, THYROIDAB in the last 72 hours. Anemia Panel: No results for input(s): VITAMINB12, FOLATE, FERRITIN, TIBC, IRON, RETICCTPCT in the last 72 hours. Urine analysis:    Component Value Date/Time   COLORURINE YELLOW 03/27/2018 2108   APPEARANCEUR CLEAR 03/27/2018 2108   APPEARANCEUR Hazy (A) 07/29/2017 0950   LABSPEC 1.015 03/27/2018 2108    PHURINE 5.0 03/27/2018 2108   GLUCOSEU >=500 (A) 03/27/2018 2108   HGBUR MODERATE (A) 03/27/2018 2108   BILIRUBINUR NEGATIVE 03/27/2018 2108   BILIRUBINUR Negative 07/29/2017 0950   KETONESUR NEGATIVE 03/27/2018 2108   PROTEINUR NEGATIVE 03/27/2018 2108   UROBILINOGEN negative 10/03/2015 1154   UROBILINOGEN 0.2 03/06/2015 2158   NITRITE NEGATIVE 03/27/2018 2108   LEUKOCYTESUR NEGATIVE 03/27/2018 2108   LEUKOCYTESUR Negative 07/29/2017 0950   Radiological Exams on Admission: CT HEAD WO CONTRAST ( ) Result Date: 05/01/2024 CLINICAL DATA:  Status post fall. EXAM: CT HEAD WITHOUT CONTRAST TECHNIQUE: Contiguous axial images were obtained from the base of the skull through the vertex without intravenous contrast. RADIATION DOSE REDUCTION: This exam was performed according to the departmental dose-optimization program which includes automated exposure control, adjustment of the mA and/or kV according to patient size and/or use of iterative reconstruction technique. COMPARISON:  November 19, 2014 FINDINGS: Brain: There is generalized cerebral atrophy with widening of the extra-axial spaces and ventricular dilatation. There  are areas of decreased attenuation within the white matter tracts of the supratentorial brain, consistent with microvascular disease changes. Vascular: Marked severity bilateral cavernous carotid artery calcification is noted. Skull: Normal. Negative for fracture or focal lesion. Sinuses/Orbits: No acute finding. Other: None. IMPRESSION: 1. Generalized cerebral atrophy and microvascular disease changes of the supratentorial brain. 2. No acute intracranial abnormality. Electronically Signed   By: Suzen Dials M.D.   On: 05/01/2024 19:06   CT Lumbar Spine Wo Contrast Result Date: 05/01/2024 CLINICAL DATA:  Low back pain, increased fracture risk. Low back pain, trauma. EXAM: CT THORACIC AND LUMBAR SPINE WITHOUT CONTRAST TECHNIQUE: Multidetector CT imaging of the thoracic and lumbar  spine was performed without contrast. Multiplanar CT image reconstructions were also generated. RADIATION DOSE REDUCTION: This exam was performed according to the departmental dose-optimization program which includes automated exposure control, adjustment of the mA and/or kV according to patient size and/or use of iterative reconstruction technique. COMPARISON:  CT abdomen/pelvis 07/30/2014. FINDINGS: CT THORACIC SPINE FINDINGS Alignment: Dextrocurvature of the thoracic spine. Trace bony retropulsion at the level of the T5 inferior endplate. Vertebrae: Age-indeterminate T5 inferior endplate vertebral compression fracture (with 50-60% vertebral body height loss). Trace bony retropulsion at the level of the T5 inferior endplate. No other thoracic spine fracture is identified. Paraspinal and other soft tissues: Prior median sternotomy. Cardiomegaly. Low-density cardiac blood pool suggesting anemia. Coronary artery atherosclerosis. Aortic atherosclerosis. No paraspinal mass or collection. Disc levels: Multilevel disc space narrowing, greatest at T5-T6, T9-T10 and T10-T11 (moderate at these levels). No spinal canal stenosis is appreciated. No high-grade bony neural foraminal narrowing. Other: Acute, mildly displaced fracture of the posterior left tenth rib. CT LUMBAR SPINE FINDINGS Segmentation: 5 lumbar vertebrae. The caudal most well-formed intervertebral disc space is designated L5-S1. Alignment: Levocurvature of the lumbar spine. Slight L1-L2 grade 1 retrolisthesis. 3 mm bony retropulsion at the level of the L2 inferior endplate. 2 mm L5-S1 grade 1 anterolisthesis. Vertebrae: Chronic L2 inferior endplate vertebral compression fracture (with up to 50% vertebral body height loss), unchanged from the prior CT abdomen/pelvis of 07/30/2014. No other lumbar spine fracture is identified. Paraspinal and other soft tissues: No acute finding within included portions of the abdomen/retroperitoneum. No paraspinal mass or  collection. Disc levels: Disc space narrowing is greatest at L2-L3 (moderate at this level). T12-L1: No significant spinal canal or foraminal stenosis. L1-L2: Slight grade 1 retrolisthesis. No significant spinal canal or foraminal stenosis. L2-L3: Mild bony retropulsion at the level of the L2 inferior endplate. Left foraminal disc protrusion. No significant spinal canal stenosis. Mild-to-moderate left neural foraminal narrowing. L3-L4: Disc bulge. Ligamentum flavum hypertrophy. No significant spinal canal stenosis. Mild bilateral neural foraminal narrowing. L4-L5: Disc bulge. Superimposed broad-based right subarticular/foraminal disc protrusion. Ligamentum flavum hypertrophy. Right subarticular stenosis. Moderate central canal stenosis. Moderate severe right neural foraminal narrowing. Mild left neural foraminal narrowing. L5-S1: Mild grade 1 anterolisthesis. Disc bulge. No significant spinal canal or foraminal stenosis. Other: Displaced fracture of the left inferior pubic ramus, new as compared to the CT abdomen/pelvis of 07/30/2014 but otherwise age-indeterminate. Partially imaged left hip prosthesis. IMPRESSION: Thoracic spine: 1. Age-indeterminate T5 inferior endplate compression fracture (50-60% vertebral body height loss). 2. Dextrocurvature of the thoracic spine. 3. Thoracic spondylosis as described. 4. Acute, displaced fracture of the posterior left tenth rib. 5. Cardiomegaly. 6. Low-density cardiac blood pool suggesting anemia. 7. Coronary artery atherosclerosis. 8. Aortic Atherosclerosis (ICD10-I70.0). Lumbar spine: 1. Chronic L2 inferior endplate vertebral compression fracture with unchanged height loss as compared to the prior  CT abdomen/pelvis of 07/30/2014 (up to 50%). Mild bony retropulsion at the level of the L2 inferior endplate. 2. Displaced fracture of the left inferior pubic ramus, new from the prior CT abdomen/pelvis of 07/30/2014 but otherwise age-indeterminate. 3. Lumbar spondylosis as  described. 4. Levocurvature of the lumbar spine. 5. Mild multilevel grade 1 spondylolisthesis. Electronically Signed   By: Rockey Childs D.O.   On: 05/01/2024 12:59   CT Thoracic Spine Wo Contrast Result Date: 05/01/2024 CLINICAL DATA:  Low back pain, increased fracture risk. Low back pain, trauma. EXAM: CT THORACIC AND LUMBAR SPINE WITHOUT CONTRAST TECHNIQUE: Multidetector CT imaging of the thoracic and lumbar spine was performed without contrast. Multiplanar CT image reconstructions were also generated. RADIATION DOSE REDUCTION: This exam was performed according to the departmental dose-optimization program which includes automated exposure control, adjustment of the mA and/or kV according to patient size and/or use of iterative reconstruction technique. COMPARISON:  CT abdomen/pelvis 07/30/2014. FINDINGS: CT THORACIC SPINE FINDINGS Alignment: Dextrocurvature of the thoracic spine. Trace bony retropulsion at the level of the T5 inferior endplate. Vertebrae: Age-indeterminate T5 inferior endplate vertebral compression fracture (with 50-60% vertebral body height loss). Trace bony retropulsion at the level of the T5 inferior endplate. No other thoracic spine fracture is identified. Paraspinal and other soft tissues: Prior median sternotomy. Cardiomegaly. Low-density cardiac blood pool suggesting anemia. Coronary artery atherosclerosis. Aortic atherosclerosis. No paraspinal mass or collection. Disc levels: Multilevel disc space narrowing, greatest at T5-T6, T9-T10 and T10-T11 (moderate at these levels). No spinal canal stenosis is appreciated. No high-grade bony neural foraminal narrowing. Other: Acute, mildly displaced fracture of the posterior left tenth rib. CT LUMBAR SPINE FINDINGS Segmentation: 5 lumbar vertebrae. The caudal most well-formed intervertebral disc space is designated L5-S1. Alignment: Levocurvature of the lumbar spine. Slight L1-L2 grade 1 retrolisthesis. 3 mm bony retropulsion at the level of the  L2 inferior endplate. 2 mm L5-S1 grade 1 anterolisthesis. Vertebrae: Chronic L2 inferior endplate vertebral compression fracture (with up to 50% vertebral body height loss), unchanged from the prior CT abdomen/pelvis of 07/30/2014. No other lumbar spine fracture is identified. Paraspinal and other soft tissues: No acute finding within included portions of the abdomen/retroperitoneum. No paraspinal mass or collection. Disc levels: Disc space narrowing is greatest at L2-L3 (moderate at this level). T12-L1: No significant spinal canal or foraminal stenosis. L1-L2: Slight grade 1 retrolisthesis. No significant spinal canal or foraminal stenosis. L2-L3: Mild bony retropulsion at the level of the L2 inferior endplate. Left foraminal disc protrusion. No significant spinal canal stenosis. Mild-to-moderate left neural foraminal narrowing. L3-L4: Disc bulge. Ligamentum flavum hypertrophy. No significant spinal canal stenosis. Mild bilateral neural foraminal narrowing. L4-L5: Disc bulge. Superimposed broad-based right subarticular/foraminal disc protrusion. Ligamentum flavum hypertrophy. Right subarticular stenosis. Moderate central canal stenosis. Moderate severe right neural foraminal narrowing. Mild left neural foraminal narrowing. L5-S1: Mild grade 1 anterolisthesis. Disc bulge. No significant spinal canal or foraminal stenosis. Other: Displaced fracture of the left inferior pubic ramus, new as compared to the CT abdomen/pelvis of 07/30/2014 but otherwise age-indeterminate. Partially imaged left hip prosthesis. IMPRESSION: Thoracic spine: 1. Age-indeterminate T5 inferior endplate compression fracture (50-60% vertebral body height loss). 2. Dextrocurvature of the thoracic spine. 3. Thoracic spondylosis as described. 4. Acute, displaced fracture of the posterior left tenth rib. 5. Cardiomegaly. 6. Low-density cardiac blood pool suggesting anemia. 7. Coronary artery atherosclerosis. 8. Aortic Atherosclerosis (ICD10-I70.0).  Lumbar spine: 1. Chronic L2 inferior endplate vertebral compression fracture with unchanged height loss as compared to the prior CT abdomen/pelvis of 07/30/2014 (up  to 50%). Mild bony retropulsion at the level of the L2 inferior endplate. 2. Displaced fracture of the left inferior pubic ramus, new from the prior CT abdomen/pelvis of 07/30/2014 but otherwise age-indeterminate. 3. Lumbar spondylosis as described. 4. Levocurvature of the lumbar spine. 5. Mild multilevel grade 1 spondylolisthesis. Electronically Signed   By: Rockey Childs D.O.   On: 05/01/2024 12:59   DG Lumbar Spine Complete Result Date: 05/01/2024 CLINICAL DATA:  Fall.  Pain. EXAM: LUMBAR SPINE - COMPLETE 4+ VIEW COMPARISON:  None. FINDINGS: Bones are diffusely demineralized. Mild convex rightward lumbar scoliosis evident. The bony demineralization and substantial overlying bowel gas limits assessment. Loss of disc height noted at L3-4. Superior endplate deformity at L3 appears chronic and may be a Schmorl's node. Atherosclerotic calcification is noted in the abdominal aorta. IMPRESSION: No definite acute lumbar spine fracture by x-ray. See report for dedicated lumbar spine CT dictated separately. Electronically Signed   By: Camellia Candle M.D.   On: 05/01/2024 12:48   Data Reviewed: Relevant notes from primary care and specialist visits, past discharge summaries as available in EHR, including Care Everywhere . Prior diagnostic testing as pertinent to current admission diagnoses, Updated medications and problem lists for reconciliation .ED course, including vitals, labs, imaging, treatment and response to treatment,Triage notes, nursing and pharmacy notes and ED provider's notes.Notable results as noted in HPI.Discussed case with EDMD/ ED APP/ or Specialty MD on call and as needed.  Assessment & Plan  >> Left inferior pubic ramus fracture: Patient presents with an isolated nondisplaced stable fracture secondary to mechanical fall she is  currently hemodynamically stable. Will admit for observation full weightbearing as tolerated in PT OT consult pain control with Tylenol  or NSAIDs. DVT prophylaxis, cardiac monitoring. Discharge planning depending on physical therapy and patient may require SNF for short-term rehab.  >> Diabetes mellitus type 2: Glycemic protocol.  Carb consistent diet.   >> Essential hypertension: Vitals:   05/01/24 1046 05/01/24 1433 05/01/24 1800 05/01/24 1852  BP: (!) 126/45 124/60 (!) 131/46 (!) 140/53  Home regimen consist of Coreg  12.5 twice daily, Lasix  20.  Will currently hold both due to soft blood pressures. Orthostatic vitals every shift x 2.  Will decide if Coreg  dose needs to be decreased or skipped on the days that patient takes Lasix .   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  We will continue patient on her aspirin , rosuvastatin . Continue with strict I's and O's.  Daily weights.   >> AKI on CKD stage IV: Lab Results  Component Value Date   CREATININE 2.19 (H) 05/01/2024   CREATININE 1.90 (H) 03/04/2024   CREATININE 2.29 (H) 12/08/2023  Will currently hold patient's Coreg  and Lasix , avoid contrast studies.  Renally dose needed medications.    >> Anemia and chronic kidney disease:    Latest Ref Rng & Units 05/01/2024   12:27 PM 04/19/2024    9:33 AM 04/02/2024   10:23 AM  CBC  WBC 4.0 - 10.5 K/uL 11.3  7.6  7.3   Hemoglobin 12.0 - 15.0 g/dL 9.8  89.7  9.7   Hematocrit 36.0 - 46.0 % 30.7  31.4  31.3   Platelets 150 - 400 K/uL 212  235  242   Overall anemia is stable, will obtain anemia panel type and screen IV PPI stool occult.    >> Acquired hypothyroidism: Continue patient on her levothyroxine  at 75 mcg.  DVT prophylaxis:  Heparin   Consults:  None   Advance Care Planning:    Code Status: Full Code   Family Communication:   Daughter and son at bedside.  Disposition Plan:  TBD pt lives at home alone and is semi independent may benefit from assisted living.  Severity of Illness: The appropriate patient status for this patient is INPATIENT. Inpatient status is judged to be reasonable and necessary in order to provide the required intensity of service to ensure the patient's safety. The patient's presenting symptoms, physical exam findings, and initial radiographic and laboratory data in the context of their chronic comorbidities is felt to place them at high risk for further clinical deterioration. Furthermore, it is not anticipated that the patient will be medically stable for discharge from the hospital within 2 midnights of admission.   * I certify that at the point of admission it is my clinical judgment that the patient will require inpatient hospital care spanning beyond 2 midnights from the point of admission due to high intensity of service, high risk for further deterioration and high frequency of surveillance required.*  Unresulted Labs (From admission, onward)     Start     Ordered   05/02/24 0500  Comprehensive metabolic panel  Tomorrow morning,   R        05/01/24 1744   05/02/24 0500  CBC  Tomorrow morning,   R        05/01/24 1744   05/02/24 0500  Occult blood card to lab, stool RN will collect  Daily,   R     Question:  Specimen to be collected by:  Answer:  RN will collect   05/01/24 2057   05/01/24 2103  Hemoglobin A1c  Once,   R       Comments: To assess prior glycemic control    05/01/24 2102   05/01/24 2058  Hepatic function panel  Once,   R        05/01/24 2057   05/01/24 2058  Vitamin B12  (Anemia Panel (PNL))  Once,   R        05/01/24 2057   05/01/24 2058  Folate  (Anemia Panel (PNL))  Once,   R        05/01/24 2057   05/01/24 2058  Iron and TIBC  (Anemia Panel (PNL))  Once,   R        05/01/24 2057   05/01/24 2058  Ferritin  (Anemia Panel (PNL))  Once,   R        05/01/24 2057    05/01/24 2058  Reticulocytes  (Anemia Panel (PNL))  Once,   R        05/01/24 2057   05/01/24 2058  Type and screen  Once,   R        05/01/24 2057            Meds ordered this encounter  Medications   HYDROcodone -acetaminophen  (NORCO/VICODIN) 5-325 MG per tablet 1 tablet    Refill:  0   ondansetron  (ZOFRAN -ODT) disintegrating tablet 4 mg   sodium chloride  flush (NS) 0.9 % injection 3 mL   OR Linked Order Group    acetaminophen  (TYLENOL ) tablet 650 mg    acetaminophen  (TYLENOL ) suppository 650 mg   morphine  (PF) 2 MG/ML injection 1 mg   sodium chloride  flush (NS) 0.9 % injection 3 mL   sodium chloride  flush (NS) 0.9 % injection 3 mL   0.9 %  sodium chloride  infusion  insulin  aspart (novoLOG ) injection 0-15 Units    Correction coverage::   Moderate (average weight, post-op)    CBG < 70::   implement hypoglycemia protocol    CBG 70 - 120::   0 units    CBG 121 - 150::   2 units    CBG 151 - 200::   3 units    CBG 201 - 250::   5 units    CBG 251 - 300::   8 units    CBG 301 - 350::   11 units    CBG 351 - 400::   15 units    CBG > 400:   call MD and obtain STAT lab verification   aspirin  EC tablet 81 mg   buPROPion  (WELLBUTRIN  XL) 24 hr tablet 150 mg   levothyroxine  (SYNTHROID ) tablet 75 mcg   pantoprazole  (PROTONIX ) injection 40 mg   heparin  injection 5,000 Units     Orders Placed This Encounter  Procedures   CT Lumbar Spine Wo Contrast   CT Thoracic Spine Wo Contrast   DG Lumbar Spine Complete   CT HEAD WO CONTRAST ( )   CBC with Differential   Basic metabolic panel   Comprehensive metabolic panel   CBC   Magnesium    Hepatic function panel   Vitamin B12   Folate   Iron and TIBC   Ferritin   Reticulocytes   Occult blood card to lab, stool RN will collect   Hemoglobin A1c   Diet Carb Modified Fluid consistency: Thin; Room service appropriate? Yes   Maintain IV access   Vital signs   Notify physician (specify)   Mobility Protocol: No Restrictions    Refer to Sidebar Report Mobility Protocol for Adult Inpatient   Initiate Adult Central Line Maintenance and Catheter Protocol for patients with central line (CVC, PICC, Port, Hemodialysis, Trialysis)   Daily weights   Intake and Output   Initiate CHG Protocol   Do not place and if present remove PureWick   Initiate Oral Care Protocol   Initiate Carrier Fluid Protocol   RN may order General Admission PRN Orders utilizing General Admission PRN medications (through manage orders) for the following patient needs: allergy symptoms (Claritin), cold sores (Carmex), cough (Robitussin DM), eye irritation (Liquifilm Tears), hemorrhoids (Tucks), indigestion (Maalox), minor skin irritation (Hydrocortisone  Cream), muscle pain Lucienne Gay), nose irritation (saline nasal spray) and sore throat (Chloraseptic spray).   Cardiac Monitoring Continuous x 48 hours Indications for use: Other; Other indications for use: ? syncope.   Neuro checks   Strict intake and output   Apply Diabetes Mellitus Care Plan   STAT CBG when hypoglycemia is suspected. If treated, recheck every 15 minutes after each treatment until CBG >/= 70 mg/dl   Refer to Hypoglycemia Protocol Sidebar Report for treatment of CBG < 70 mg/dl   No HS correction Insulin    Orthostatic vital signs   Full code   Consult to hospitalist   PT eval and treat   Pulse oximetry check with vital signs   Oxygen therapy Mode or (Route): Nasal cannula; Liters Per Minute: 2; Keep O2 saturation between: greater than 92 %   Type and screen   Place in observation (patient's expected length of stay will be less than 2 midnights)   Aspiration precautions   Fall precautions    Author: Mario LULLA Blanch, MD 12 pm- 8 pm. [facility]. 05/01/2024 9:12 PM Please note for any communication after hours contact [facility] Assigned provider on call on Amion.

## 2024-05-01 NOTE — ED Provider Notes (Signed)
 Discussed patient with the hospitalist, Dr. Tobie who will admit the patient for further treatment.   Lenor Hollering, MD 05/01/24 [phone]

## 2024-05-01 NOTE — ED Triage Notes (Addendum)
 BIB [facility]   From home, c/c mechanical fall on Monday the 18th, pain started yesterday in her lower back, has been walking at home but unable to due to pain. U/k if on blood thinners. GCS 15, Aox4. Pt reports using a walker at baseline and advised she slipped on wet floor, denied LOC.   Lives at home by herself, family checks on her often- they are en-route to hospital.  BGL 299, takes insulin  at home.   20 RAC  150mcg fentanyl   30mg  toradol 4mg  zofran 

## 2024-05-01 NOTE — ED Notes (Signed)
 Pt provided sandwich bag and drinks.

## 2024-05-02 DIAGNOSIS — W19XXXA Unspecified fall, initial encounter: Secondary | ICD-10-CM | POA: Diagnosis not present

## 2024-05-02 DIAGNOSIS — K219 Gastro-esophageal reflux disease without esophagitis: Secondary | ICD-10-CM | POA: Diagnosis not present

## 2024-05-02 DIAGNOSIS — N184 Chronic kidney disease, stage 4 (severe): Secondary | ICD-10-CM

## 2024-05-02 DIAGNOSIS — I152 Hypertension secondary to endocrine disorders: Secondary | ICD-10-CM

## 2024-05-02 DIAGNOSIS — E039 Hypothyroidism, unspecified: Secondary | ICD-10-CM | POA: Diagnosis not present

## 2024-05-02 DIAGNOSIS — D631 Anemia in chronic kidney disease: Secondary | ICD-10-CM | POA: Diagnosis not present

## 2024-05-02 DIAGNOSIS — E1159 Type 2 diabetes mellitus with other circulatory complications: Secondary | ICD-10-CM | POA: Diagnosis not present

## 2024-05-02 DIAGNOSIS — Y92009 Unspecified place in unspecified non-institutional (private) residence as the place of occurrence of the external cause: Secondary | ICD-10-CM

## 2024-05-02 LAB — COMPREHENSIVE METABOLIC PANEL WITH GFR
ALT: 18 U/L (ref 0–44)
AST: 18 U/L (ref 15–41)
Albumin: 2.9 g/dL — ABNORMAL LOW (ref 3.5–5.0)
Alkaline Phosphatase: 87 U/L (ref 38–126)
Anion gap: 13 (ref 5–15)
BUN: 50 mg/dL — ABNORMAL HIGH (ref 8–23)
CO2: 20 mmol/L — ABNORMAL LOW (ref 22–32)
Calcium: 8.7 mg/dL — ABNORMAL LOW (ref 8.9–10.3)
Chloride: 103 mmol/L (ref 98–111)
Creatinine, Ser: 1.92 mg/dL — ABNORMAL HIGH (ref 0.44–1.00)
GFR, Estimated: 27 mL/min — ABNORMAL LOW (ref >60)
Glucose, Bld: 193 mg/dL — ABNORMAL HIGH (ref 70–99)
Potassium: 4 mmol/L (ref 3.5–5.1)
Sodium: 136 mmol/L (ref 135–145)
Total Bilirubin: 0.6 mg/dL (ref 0.0–1.2)
Total Protein: 6.3 g/dL — ABNORMAL LOW (ref 6.5–8.1)

## 2024-05-02 LAB — CBC
HCT: 31.7 % — ABNORMAL LOW (ref 36.0–46.0)
Hemoglobin: 10.3 g/dL — ABNORMAL LOW (ref 12.0–15.0)
MCH: 28.5 pg (ref 26.0–34.0)
MCHC: 32.5 g/dL (ref 30.0–36.0)
MCV: 87.8 fL (ref 80.0–100.0)
Platelets: 227 K/uL (ref 150–400)
RBC: 3.61 MIL/uL — ABNORMAL LOW (ref 3.87–5.11)
RDW: 20.4 % — ABNORMAL HIGH (ref 11.5–15.5)
WBC: 10.6 K/uL — ABNORMAL HIGH (ref 4.0–10.5)
nRBC: 0 % (ref 0.0–0.2)

## 2024-05-02 LAB — URINALYSIS, ROUTINE W REFLEX MICROSCOPIC
Bilirubin Urine: NEGATIVE
Glucose, UA: NEGATIVE mg/dL
Ketones, ur: 5 mg/dL — AB
Leukocytes,Ua: NEGATIVE
Nitrite: NEGATIVE
Protein, ur: 100 mg/dL — AB
Specific Gravity, Urine: 1.02 (ref 1.005–1.030)
WBC, UA: >50 WBC/hpf (ref 0–5)
pH: 7 (ref 5.0–8.0)

## 2024-05-02 LAB — GLUCOSE, CAPILLARY
Glucose-Capillary: 204 mg/dL — ABNORMAL HIGH (ref 70–99)
Glucose-Capillary: 174 mg/dL — ABNORMAL HIGH (ref 70–99)
Glucose-Capillary: 215 mg/dL — ABNORMAL HIGH (ref 70–99)
Glucose-Capillary: 167 mg/dL — ABNORMAL HIGH (ref 70–99)

## 2024-05-02 LAB — VITAMIN B12: Vitamin B-12: 461 pg/mL (ref 180–914)

## 2024-05-02 LAB — FOLATE: Folate: 14.5 ng/mL (ref >5.9)

## 2024-05-02 MED ORDER — INSULIN GLARGINE 100 UNIT/ML ~~LOC~~ SOLN
8.0000 [IU] | Freq: Every day | SUBCUTANEOUS | Status: DC
  Administered 2024-05-02: 8 [IU] via SUBCUTANEOUS
  Filled 2024-05-02 (×2): qty 0.08

## 2024-05-02 MED ORDER — TRAMADOL HCL 50 MG PO TABS
50.0000 mg | ORAL_TABLET | Freq: Four times a day (QID) | ORAL | Status: DC | PRN
  Administered 2024-05-02 – 2024-05-05 (×5): 50 mg via ORAL
  Filled 2024-05-02 (×5): qty 1

## 2024-05-02 MED ORDER — SODIUM CHLORIDE 0.9 % IV SOLN
1.0000 g | INTRAVENOUS | Status: DC
  Administered 2024-05-02 – 2024-05-04 (×3): 1 g via INTRAVENOUS
  Filled 2024-05-02 (×3): qty 10

## 2024-05-02 MED ORDER — CARVEDILOL 6.25 MG PO TABS
6.2500 mg | ORAL_TABLET | Freq: Two times a day (BID) | ORAL | Status: DC
  Administered 2024-05-02 – 2024-05-05 (×6): 6.25 mg via ORAL
  Filled 2024-05-02 (×6): qty 1

## 2024-05-02 MED ORDER — PANTOPRAZOLE SODIUM 40 MG PO TBEC
40.0000 mg | DELAYED_RELEASE_TABLET | Freq: Two times a day (BID) | ORAL | Status: DC
  Administered 2024-05-02 – 2024-05-05 (×6): 40 mg via ORAL
  Filled 2024-05-02 (×6): qty 1

## 2024-05-02 NOTE — Care Management Obs Status (Signed)
 MEDICARE OBSERVATION STATUS NOTIFICATION   Patient Details  Name: Bridget Martinez MRN: [id number] Date of Birth: Mar 09, 1951   Medicare Observation Status Notification Given:    Attempt made to do MOON letter, lab draw in process.   Robynn Eileen Hoose, RN 05/02/2024, 10:14 AM

## 2024-05-02 NOTE — Progress Notes (Signed)
 [facility]                                                                               Bridget Martinez, is a 73 y.o. female, DOB - May 03, 1951, FMW:[id number] Admit date - 05/01/2024    Outpatient Primary MD for the patient is Bridget, [name], FNP  LOS - 0  days    Brief summary   Bridget Martinez is a 73 y.o. female with past medical history  of anemia being followed by cancer center at [facility] patient received a Retacrit  injection on 19 April 2024, essential hypertension, diabetes mellitus, acquired hypothyroidism being admitted today for a fall and pain.  Per report patient had a mechanical fall where she on Monday, 18 August and fell and slipped on a wet floor on her bottom, no reports of loss of consciousness and started having pain yesterday more so on her lower back with difficulty walking and unable to bear weight because of the pain . patient at baseline uses a walker. She reports some back pain.  X-ray of the lumbar spine shows no acute lumbar spine fracture.  CT of the thoracic and lumbar spine shows: - Age indeterminant T5 inferior endplate compression fracture, -Dextrocurvature of the T-spine. -Thoracic spondylosis - An acute displaced fracture of the posterior left 10th rib. - Chronic L2 compression fracture.  - Displaced fracture of the left inferior pubic rami that is new.   She was admitted for pain control and therapy evaluation.   Assessment & Plan    Assessment and Plan:  Left inferior pubic ramus fracture:  Weight bearing as tolerated.  Outpatient follow up with orthopedics.  PT/OT EVAL.  Pain control with morphine  and tramadol .     Acute displaced fracture of the posterior left 10th rib.  Pain control.  Therapy eval.    Hypertension Well controlled.   Type 2 DM CBG (last 3)  Recent Labs    05/01/24 2213 05/02/24 0603 05/02/24 1151  GLUCAP 311* 204* 174*   Resume SSI.       Estimated body mass index is 23.5 kg/m as  calculated from the following:   Height as of 04/07/24: 5' 4 (1.626 m).   Weight as of this encounter: 62.1 kg.  Code Status:full code.  DVT Prophylaxis:  heparin  injection 5,000 Units Start: 05/01/24 2200   Level of Care: Level of care: Telemetry Medical Family Communication: none at bedside.  Disposition Plan:     Remains inpatient appropriate:  pending.   Procedures:  None.   Consultants:   None.   Antimicrobials:   Anti-infectives (From admission, onward)    None        Medications  Scheduled Meds:  aspirin  EC  81 mg Oral Daily   buPROPion   150 mg Oral BID   heparin  injection (subcutaneous)  5,000 Units Subcutaneous Q8H   insulin  aspart  0-15 Units Subcutaneous TID WC   levothyroxine   75 mcg Oral QAC breakfast   pantoprazole   40 mg Oral BID   sodium chloride  flush  3 mL Intravenous Q12H   sodium chloride  flush  3 mL Intravenous Q12H   Continuous Infusions:  sodium chloride   PRN Meds:.sodium chloride , acetaminophen  **OR** acetaminophen , morphine  injection, sodium chloride  flush, traMADol     Subjective:   Tekeyah Santiago was seen and examined today.  Pain suboptimally controlled, increased pain meds  Objective:   Vitals:   05/01/24 2220 05/02/24 0421 05/02/24 0500 05/02/24 0827  BP: (!) 142/45 (!) 127/48  (!) 125/41  Pulse: 71 63  65  Resp: 18 18  17   Temp: 98.4 F (36.9 C) 98.1 F (36.7 C)  97.7 F (36.5 C)  TempSrc: Oral Oral  Oral  SpO2: 100% 100%  97%  Weight:   62.1 kg     Intake/Output Summary (Last 24 hours) at 05/02/2024 1213 Last data filed at 05/02/2024 9378 Gross per 24 hour  Intake --  Output 550 ml  Net -550 ml   Filed Weights   05/02/24 0500  Weight: 62.1 kg     Exam General exam: Appears calm and comfortable  Respiratory system: Clear to auscultation. Respiratory effort normal. Cardiovascular system: S1 & S2 heard, RRR. No JVD, Gastrointestinal system: Abdomen is nondistended, soft and nontender.  Central nervous  system: Alert and oriented. No focal neurological deficits. Extremities: Symmetric 5 x 5 power. Skin: No rashes, lesions or ulcers Psychiatry: Mood & affect appropriate.     Data Reviewed:  I have personally reviewed following labs and imaging studies   CBC Lab Results  Component Value Date   WBC 11.3 (H) 05/01/2024   RBC 3.33 (L) 05/01/2024   HGB 9.8 (L) 05/01/2024   HCT 30.7 (L) 05/01/2024   MCV 89.5 05/01/2024   MCH 28.6 05/01/2024   PLT 212 05/01/2024   MCHC 31.9 05/01/2024   RDW 20.2 (H) 05/01/2024   LYMPHSABS 0.8 05/01/2024   MONOABS 1.3 (H) 05/01/2024   EOSABS 0.2 05/01/2024   BASOSABS 0.1 05/01/2024     Last metabolic panel Lab Results  Component Value Date   NA 138 05/01/2024   K 3.7 05/01/2024   CL 104 05/01/2024   CO2 23 05/01/2024   BUN 51 (H) 05/01/2024   CREATININE 2.19 (H) 05/01/2024   GLUCOSE 189 (H) 05/01/2024   GFRNONAA 23 (L) 05/01/2024   GFRAA 51 (L) 08/14/2018   CALCIUM  8.9 05/01/2024   PHOS 2.3 (L) 03/08/2015   PROT 6.4 (L) 05/01/2024   ALBUMIN  3.0 (L) 05/01/2024   LABGLOB 2.3 11/21/2023   AGRATIO 2.0 06/07/2022   BILITOT 1.0 05/01/2024   ALKPHOS 84 05/01/2024   AST 18 05/01/2024   ALT 18 05/01/2024   ANIONGAP 11 05/01/2024    CBG (last 3)  Recent Labs    05/01/24 2213 05/02/24 0603 05/02/24 1151  GLUCAP 311* 204* 174*      Coagulation Profile: No results for input(s): INR, PROTIME in the last 168 hours.   Radiology Studies: CT HEAD WO CONTRAST ( ) Result Date: 05/01/2024 CLINICAL DATA:  Status post fall. EXAM: CT HEAD WITHOUT CONTRAST TECHNIQUE: Contiguous axial images were obtained from the base of the skull through the vertex without intravenous contrast. RADIATION DOSE REDUCTION: This exam was performed according to the departmental dose-optimization program which includes automated exposure control, adjustment of the mA and/or kV according to patient size and/or use of iterative reconstruction technique.  COMPARISON:  November 19, 2014 FINDINGS: Brain: There is generalized cerebral atrophy with widening of the extra-axial spaces and ventricular dilatation. There are areas of decreased attenuation within the white matter tracts of the supratentorial brain, consistent with microvascular disease changes. Vascular: Marked severity bilateral cavernous carotid artery calcification is noted. Skull: Normal. Negative  for fracture or focal lesion. Sinuses/Orbits: No acute finding. Other: None. IMPRESSION: 1. Generalized cerebral atrophy and microvascular disease changes of the supratentorial brain. 2. No acute intracranial abnormality. Electronically Signed   By: Suzen Dials M.D.   On: 05/01/2024 19:06   CT Lumbar Spine Wo Contrast Result Date: 05/01/2024 CLINICAL DATA:  Low back pain, increased fracture risk. Low back pain, trauma. EXAM: CT THORACIC AND LUMBAR SPINE WITHOUT CONTRAST TECHNIQUE: Multidetector CT imaging of the thoracic and lumbar spine was performed without contrast. Multiplanar CT image reconstructions were also generated. RADIATION DOSE REDUCTION: This exam was performed according to the departmental dose-optimization program which includes automated exposure control, adjustment of the mA and/or kV according to patient size and/or use of iterative reconstruction technique. COMPARISON:  CT abdomen/pelvis 07/30/2014. FINDINGS: CT THORACIC SPINE FINDINGS Alignment: Dextrocurvature of the thoracic spine. Trace bony retropulsion at the level of the T5 inferior endplate. Vertebrae: Age-indeterminate T5 inferior endplate vertebral compression fracture (with 50-60% vertebral body height loss). Trace bony retropulsion at the level of the T5 inferior endplate. No other thoracic spine fracture is identified. Paraspinal and other soft tissues: Prior median sternotomy. Cardiomegaly. Low-density cardiac blood pool suggesting anemia. Coronary artery atherosclerosis. Aortic atherosclerosis. No paraspinal mass or  collection. Disc levels: Multilevel disc space narrowing, greatest at T5-T6, T9-T10 and T10-T11 (moderate at these levels). No spinal canal stenosis is appreciated. No high-grade bony neural foraminal narrowing. Other: Acute, mildly displaced fracture of the posterior left tenth rib. CT LUMBAR SPINE FINDINGS Segmentation: 5 lumbar vertebrae. The caudal most well-formed intervertebral disc space is designated L5-S1. Alignment: Levocurvature of the lumbar spine. Slight L1-L2 grade 1 retrolisthesis. 3 mm bony retropulsion at the level of the L2 inferior endplate. 2 mm L5-S1 grade 1 anterolisthesis. Vertebrae: Chronic L2 inferior endplate vertebral compression fracture (with up to 50% vertebral body height loss), unchanged from the prior CT abdomen/pelvis of 07/30/2014. No other lumbar spine fracture is identified. Paraspinal and other soft tissues: No acute finding within included portions of the abdomen/retroperitoneum. No paraspinal mass or collection. Disc levels: Disc space narrowing is greatest at L2-L3 (moderate at this level). T12-L1: No significant spinal canal or foraminal stenosis. L1-L2: Slight grade 1 retrolisthesis. No significant spinal canal or foraminal stenosis. L2-L3: Mild bony retropulsion at the level of the L2 inferior endplate. Left foraminal disc protrusion. No significant spinal canal stenosis. Mild-to-moderate left neural foraminal narrowing. L3-L4: Disc bulge. Ligamentum flavum hypertrophy. No significant spinal canal stenosis. Mild bilateral neural foraminal narrowing. L4-L5: Disc bulge. Superimposed broad-based right subarticular/foraminal disc protrusion. Ligamentum flavum hypertrophy. Right subarticular stenosis. Moderate central canal stenosis. Moderate severe right neural foraminal narrowing. Mild left neural foraminal narrowing. L5-S1: Mild grade 1 anterolisthesis. Disc bulge. No significant spinal canal or foraminal stenosis. Other: Displaced fracture of the left inferior pubic ramus,  new as compared to the CT abdomen/pelvis of 07/30/2014 but otherwise age-indeterminate. Partially imaged left hip prosthesis. IMPRESSION: Thoracic spine: 1. Age-indeterminate T5 inferior endplate compression fracture (50-60% vertebral body height loss). 2. Dextrocurvature of the thoracic spine. 3. Thoracic spondylosis as described. 4. Acute, displaced fracture of the posterior left tenth rib. 5. Cardiomegaly. 6. Low-density cardiac blood pool suggesting anemia. 7. Coronary artery atherosclerosis. 8. Aortic Atherosclerosis (ICD10-I70.0). Lumbar spine: 1. Chronic L2 inferior endplate vertebral compression fracture with unchanged height loss as compared to the prior CT abdomen/pelvis of 07/30/2014 (up to 50%). Mild bony retropulsion at the level of the L2 inferior endplate. 2. Displaced fracture of the left inferior pubic ramus, new from the prior CT  abdomen/pelvis of 07/30/2014 but otherwise age-indeterminate. 3. Lumbar spondylosis as described. 4. Levocurvature of the lumbar spine. 5. Mild multilevel grade 1 spondylolisthesis. Electronically Signed   By: Rockey Childs D.O.   On: 05/01/2024 12:59   CT Thoracic Spine Wo Contrast Result Date: 05/01/2024 CLINICAL DATA:  Low back pain, increased fracture risk. Low back pain, trauma. EXAM: CT THORACIC AND LUMBAR SPINE WITHOUT CONTRAST TECHNIQUE: Multidetector CT imaging of the thoracic and lumbar spine was performed without contrast. Multiplanar CT image reconstructions were also generated. RADIATION DOSE REDUCTION: This exam was performed according to the departmental dose-optimization program which includes automated exposure control, adjustment of the mA and/or kV according to patient size and/or use of iterative reconstruction technique. COMPARISON:  CT abdomen/pelvis 07/30/2014. FINDINGS: CT THORACIC SPINE FINDINGS Alignment: Dextrocurvature of the thoracic spine. Trace bony retropulsion at the level of the T5 inferior endplate. Vertebrae: Age-indeterminate T5  inferior endplate vertebral compression fracture (with 50-60% vertebral body height loss). Trace bony retropulsion at the level of the T5 inferior endplate. No other thoracic spine fracture is identified. Paraspinal and other soft tissues: Prior median sternotomy. Cardiomegaly. Low-density cardiac blood pool suggesting anemia. Coronary artery atherosclerosis. Aortic atherosclerosis. No paraspinal mass or collection. Disc levels: Multilevel disc space narrowing, greatest at T5-T6, T9-T10 and T10-T11 (moderate at these levels). No spinal canal stenosis is appreciated. No high-grade bony neural foraminal narrowing. Other: Acute, mildly displaced fracture of the posterior left tenth rib. CT LUMBAR SPINE FINDINGS Segmentation: 5 lumbar vertebrae. The caudal most well-formed intervertebral disc space is designated L5-S1. Alignment: Levocurvature of the lumbar spine. Slight L1-L2 grade 1 retrolisthesis. 3 mm bony retropulsion at the level of the L2 inferior endplate. 2 mm L5-S1 grade 1 anterolisthesis. Vertebrae: Chronic L2 inferior endplate vertebral compression fracture (with up to 50% vertebral body height loss), unchanged from the prior CT abdomen/pelvis of 07/30/2014. No other lumbar spine fracture is identified. Paraspinal and other soft tissues: No acute finding within included portions of the abdomen/retroperitoneum. No paraspinal mass or collection. Disc levels: Disc space narrowing is greatest at L2-L3 (moderate at this level). T12-L1: No significant spinal canal or foraminal stenosis. L1-L2: Slight grade 1 retrolisthesis. No significant spinal canal or foraminal stenosis. L2-L3: Mild bony retropulsion at the level of the L2 inferior endplate. Left foraminal disc protrusion. No significant spinal canal stenosis. Mild-to-moderate left neural foraminal narrowing. L3-L4: Disc bulge. Ligamentum flavum hypertrophy. No significant spinal canal stenosis. Mild bilateral neural foraminal narrowing. L4-L5: Disc bulge.  Superimposed broad-based right subarticular/foraminal disc protrusion. Ligamentum flavum hypertrophy. Right subarticular stenosis. Moderate central canal stenosis. Moderate severe right neural foraminal narrowing. Mild left neural foraminal narrowing. L5-S1: Mild grade 1 anterolisthesis. Disc bulge. No significant spinal canal or foraminal stenosis. Other: Displaced fracture of the left inferior pubic ramus, new as compared to the CT abdomen/pelvis of 07/30/2014 but otherwise age-indeterminate. Partially imaged left hip prosthesis. IMPRESSION: Thoracic spine: 1. Age-indeterminate T5 inferior endplate compression fracture (50-60% vertebral body height loss). 2. Dextrocurvature of the thoracic spine. 3. Thoracic spondylosis as described. 4. Acute, displaced fracture of the posterior left tenth rib. 5. Cardiomegaly. 6. Low-density cardiac blood pool suggesting anemia. 7. Coronary artery atherosclerosis. 8. Aortic Atherosclerosis (ICD10-I70.0). Lumbar spine: 1. Chronic L2 inferior endplate vertebral compression fracture with unchanged height loss as compared to the prior CT abdomen/pelvis of 07/30/2014 (up to 50%). Mild bony retropulsion at the level of the L2 inferior endplate. 2. Displaced fracture of the left inferior pubic ramus, new from the prior CT abdomen/pelvis of 07/30/2014 but otherwise age-indeterminate.  3. Lumbar spondylosis as described. 4. Levocurvature of the lumbar spine. 5. Mild multilevel grade 1 spondylolisthesis. Electronically Signed   By: Rockey Childs D.O.   On: 05/01/2024 12:59   DG Lumbar Spine Complete Result Date: 05/01/2024 CLINICAL DATA:  Fall.  Pain. EXAM: LUMBAR SPINE - COMPLETE 4+ VIEW COMPARISON:  None. FINDINGS: Bones are diffusely demineralized. Mild convex rightward lumbar scoliosis evident. The bony demineralization and substantial overlying bowel gas limits assessment. Loss of disc height noted at L3-4. Superior endplate deformity at L3 appears chronic and may be a Schmorl's  node. Atherosclerotic calcification is noted in the abdominal aorta. IMPRESSION: No definite acute lumbar spine fracture by x-ray. See report for dedicated lumbar spine CT dictated separately. Electronically Signed   By: Camellia Candle M.D.   On: 05/01/2024 12:48       Elgie Butter M.D. [facility] 05/02/2024, 12:13 PM  Available via Epic secure chat 7am-7pm After 7 pm, please refer to night coverage provider listed on amion.

## 2024-05-02 NOTE — Evaluation (Signed)
 Physical Therapy Evaluation Patient Details Name: Bridget Martinez MRN: [id number] DOB: May 01, 1951 Today's Date: 05/02/2024  History of Present Illness  Bridget Martinez is a 73 y.o. female who presented to [facility] ED 8/23 after mechanical fall at home. CT of thoracic/lumbar spine shows age indeterminant T5 inferior endplate compression fx, acute displaced fx of the posterior left 10th rib, displaced fracture of the left inferior pubic rami, and chronic L2 compression fracture. PMHx: anemia being followed by cancer center at [facility], received a Retacrit  injection on 04/19/24, essential HTN, CHF, diabetes mellitus, GERD, HLD, osteoporosis, and acquired hypothyroidism.   Clinical Impression  Pt admitted with above diagnosis. PTA, pt was independent with functional mobility and ADLs. She lives alone in a one story house with 4 STE. Pt currently with functional limitations due to the deficits listed below (see PT Problem List). She required maxA for bed mobility and modA to transfer from an elevated surface using RW. Pt was unable to ambulate. She is limited by pain and decreased activity tolerance. Pt will benefit from acute skilled PT to increase her independence and safety with mobility to allow discharge. Recommend continued inpatient follow up therapy, <3 hours/day.    If plan is discharge home, recommend the following: A lot of help with walking and/or transfers;A lot of help with bathing/dressing/bathroom;Assistance with cooking/housework;Assist for transportation;Help with stairs or ramp for entrance   Can travel by private vehicle   No    Equipment Recommendations Rolling walker (2 wheels)  Recommendations for Other Services  OT consult    Functional Status Assessment Patient has had a recent decline in their functional status and demonstrates the ability to make significant improvements in function in a reasonable and predictable amount of time.     Precautions / Restrictions  Precautions Precautions: Fall Recall of Precautions/Restrictions: Intact Restrictions Weight Bearing Restrictions Per Provider Order: Yes LLE Weight Bearing Per Provider Order: Weight bearing as tolerated      Mobility  Bed Mobility Overal bed mobility: Needs Assistance Bed Mobility: Supine to Sit     Supine to sit: Max assist     General bed mobility comments: Pt sat up on L side of bed. Cues for sequencing. Limited participation d/t pain. Utilized bed pad to pivot pt, elevate trunk, and scoot fwd til feet flat.    Transfers Overall transfer level: Needs assistance Equipment used: Rolling walker (2 wheels) Transfers: Sit to/from Stand, Bed to chair/wheelchair/BSC Sit to Stand: From elevated surface, Mod assist Stand pivot transfers: From elevated surface, Mod assist         General transfer comment: Pt stood from raised bed height. Cued proper hand placement using RW. Powered up with modA. Transferred to recliner chair positioned very close to bed. Pt pivoted on feet unable to take a step. Cues for sequencing. Assist to manuever RW and facilitate hip pivot. Good eccentric control.    Ambulation/Gait               General Gait Details: Unable  Stairs            Wheelchair Mobility     Tilt Bed    Modified Rankin (Stroke Patients Only)       Balance Overall balance assessment: Needs assistance, History of Falls Sitting-balance support: Bilateral upper extremity supported, Feet supported Sitting balance-Leahy Scale: Fair Sitting balance - Comments: Pt sat EOB with CGA-close supervision.   Standing balance support: Bilateral upper extremity supported, During functional activity, Reliant on assistive device for balance Standing  balance-Leahy Scale: Poor Standing balance comment: Pt dependent on RW and modA.                             Pertinent Vitals/Pain Pain Assessment Pain Assessment: 0-10 Pain Score: 5  Pain Location: Bottom; L  hip and L flank Pain Descriptors / Indicators: Aching, Discomfort, Grimacing, Guarding Pain Intervention(s): Limited activity within patient's tolerance, Monitored during session, Repositioned, Patient requesting pain meds-RN notified    Home Living Family/patient expects to be discharged to:: Private residence Living Arrangements: Alone Available Help at Discharge: Family;Available PRN/intermittently (Son and Daughter both work) Type of Home: House Home Access: Stairs to enter Entrance Stairs-Rails: Acupuncturist of Steps: 4   Home Layout: One level Home Equipment: Educational psychologist (4 wheels);BSC/3in1;Wheelchair - manual      Prior Function Prior Level of Function : Independent/Modified Independent             Mobility Comments: Ambulates without AD. 1 fall leading to current admission. Has been using rollator since fall. ADLs Comments: Indep with ADLs. Manages her own medications. Son picks up meds from pharmacy and will bring her groceries. She doesn't cook, but can reheat food and do small snacks. Her family brings her supper every night.     Extremity/Trunk Assessment   Upper Extremity Assessment Upper Extremity Assessment: Defer to OT evaluation    Lower Extremity Assessment Lower Extremity Assessment: Generalized weakness;LLE deficits/detail LLE Deficits / Details: Pt with limited hip/knee AROM, full ankle AROM. Pt actively guarded against PROM. Grossly 2+/5 hip/knee strength. Grossly 3+/5 ankle strength. LLE: Unable to fully assess due to pain LLE Sensation: WNL LLE Coordination: decreased gross motor    Cervical / Trunk Assessment Cervical / Trunk Assessment: Kyphotic  Communication   Communication Communication: No apparent difficulties    Cognition Arousal: Alert Behavior During Therapy: WFL for tasks assessed/performed   PT - Cognitive impairments: No apparent impairments                       PT - Cognition Comments: Pt  A,Ox4 Following commands: Intact       Cueing Cueing Techniques: Verbal cues     General Comments General comments (skin integrity, edema, etc.): BLE edema and dry flaky skin.    Exercises     Assessment/Plan    PT Assessment Patient needs continued PT services  PT Problem List Decreased strength;Decreased range of motion;Decreased activity tolerance;Decreased balance;Decreased mobility;Decreased knowledge of use of DME;Decreased safety awareness;Pain       PT Treatment Interventions DME instruction;Gait training;Functional mobility training;Therapeutic activities;Therapeutic exercise;Balance training;Stair training;Patient/family education;Wheelchair mobility training    PT Goals (Current goals can be found in the Care Plan section)  Acute Rehab PT Goals Patient Stated Goal: Have less pain and get back to moving PT Goal Formulation: With patient Time For Goal Achievement: 05/16/24 Potential to Achieve Goals: Good    Frequency Min 2X/week     Co-evaluation               AM-PAC PT 6 Clicks Mobility  Outcome Measure Help needed turning from your back to your side while in a flat bed without using bedrails?: A Lot Help needed moving from lying on your back to sitting on the side of a flat bed without using bedrails?: A Lot Help needed moving to and from a bed to a chair (including a wheelchair)?: A Lot Help needed standing up from a  chair using your arms (e.g., wheelchair or bedside chair)?: A Lot Help needed to walk in hospital room?: Total Help needed climbing 3-5 steps with a railing? : Total 6 Click Score: 10    End of Session Equipment Utilized During Treatment: Gait belt Activity Tolerance: Patient tolerated treatment well;Patient limited by pain Patient left: in chair;with call bell/phone within reach;with chair alarm set Nurse Communication: Mobility status;Patient requests pain meds PT Visit Diagnosis: Difficulty in walking, not elsewhere classified  (R26.2);Other abnormalities of gait and mobility (R26.89);Unsteadiness on feet (R26.81);History of falling (Z91.81);Pain Pain - Right/Left: Left Pain - part of body: Hip (Rib)    Time: 9169-9146 PT Time Calculation (min) (ACUTE ONLY): 23 min   Charges:   PT Evaluation $PT Eval Moderate Complexity: 1 Mod   PT General Charges $$ ACUTE PT VISIT: 1 Visit         Randall SAUNDERS, PT, DPT Acute Rehabilitation Services Office: [phone] Secure Chat Preferred  Bridget Martinez 05/02/2024, 10:40 AM

## 2024-05-02 NOTE — TOC CAGE-AID Note (Signed)
 Transition of Care [facility]) - CAGE-AID Screening   Patient Details  Name: Bridget Martinez MRN: [id number] Date of Birth: 1951/04/10  Transition of Care [facility]) CM/SW Contact:    LEBRON ROCKIE ORN, RN Phone Number: [phone] 05/02/2024, 6:38 PM   Clinical Narrative: Pt reports that she does not drink alcohol and does not do drugs.  Screening complete.    CAGE-AID Screening:    Have You Ever Felt You Ought to Cut Down on Your Drinking or Drug Use?: No Have People Annoyed You By Critizing Your Drinking Or Drug Use?: No Have You Felt Bad Or Guilty About Your Drinking Or Drug Use?: No Have You Ever Had a Drink or Used Drugs First Thing In The Morning to Steady Your Nerves or to Get Rid of a Hangover?: No CAGE-AID Score: 0  Substance Abuse Education Offered: No

## 2024-05-02 NOTE — Care Management Obs Status (Signed)
 MEDICARE OBSERVATION STATUS NOTIFICATION   Patient Details  Name: CORRIN SIELING MRN: [id number] Date of Birth: [birth year]   Medicare Observation Status Notification Given:  Yes    Robynn Eileen Hoose, RN 05/02/2024, 4:20 PM

## 2024-05-03 ENCOUNTER — Inpatient Hospital Stay

## 2024-05-03 DIAGNOSIS — E039 Hypothyroidism, unspecified: Secondary | ICD-10-CM | POA: Diagnosis not present

## 2024-05-03 DIAGNOSIS — W19XXXA Unspecified fall, initial encounter: Secondary | ICD-10-CM | POA: Diagnosis not present

## 2024-05-03 DIAGNOSIS — N184 Chronic kidney disease, stage 4 (severe): Secondary | ICD-10-CM | POA: Diagnosis not present

## 2024-05-03 DIAGNOSIS — K219 Gastro-esophageal reflux disease without esophagitis: Secondary | ICD-10-CM | POA: Diagnosis not present

## 2024-05-03 DIAGNOSIS — Y92009 Unspecified place in unspecified non-institutional (private) residence as the place of occurrence of the external cause: Secondary | ICD-10-CM | POA: Diagnosis not present

## 2024-05-03 DIAGNOSIS — E1159 Type 2 diabetes mellitus with other circulatory complications: Secondary | ICD-10-CM | POA: Diagnosis not present

## 2024-05-03 DIAGNOSIS — D631 Anemia in chronic kidney disease: Secondary | ICD-10-CM | POA: Diagnosis not present

## 2024-05-03 DIAGNOSIS — I152 Hypertension secondary to endocrine disorders: Secondary | ICD-10-CM | POA: Diagnosis not present

## 2024-05-03 LAB — CBC WITH DIFFERENTIAL/PLATELET
Abs Immature Granulocytes: 0.04 K/uL (ref 0.00–0.07)
Basophils Absolute: 0.1 K/uL (ref 0.0–0.1)
Basophils Relative: 1 %
Eosinophils Absolute: 0.4 K/uL (ref 0.0–0.5)
Eosinophils Relative: 4 %
HCT: 32.7 % — ABNORMAL LOW (ref 36.0–46.0)
Hemoglobin: 10.6 g/dL — ABNORMAL LOW (ref 12.0–15.0)
Immature Granulocytes: 0 %
Lymphocytes Relative: 14 %
Lymphs Abs: 1.4 K/uL (ref 0.7–4.0)
MCH: 28.3 pg (ref 26.0–34.0)
MCHC: 32.4 g/dL (ref 30.0–36.0)
MCV: 87.4 fL (ref 80.0–100.0)
Monocytes Absolute: 1.2 K/uL — ABNORMAL HIGH (ref 0.1–1.0)
Monocytes Relative: 12 %
Neutro Abs: 6.5 K/uL (ref 1.7–7.7)
Neutrophils Relative %: 69 %
Platelets: 275 K/uL (ref 150–400)
RBC: 3.74 MIL/uL — ABNORMAL LOW (ref 3.87–5.11)
RDW: 19.9 % — ABNORMAL HIGH (ref 11.5–15.5)
WBC: 9.5 K/uL (ref 4.0–10.5)
nRBC: 0 % (ref 0.0–0.2)

## 2024-05-03 LAB — GLUCOSE, CAPILLARY
Glucose-Capillary: 193 mg/dL — ABNORMAL HIGH (ref 70–99)
Glucose-Capillary: 228 mg/dL — ABNORMAL HIGH (ref 70–99)
Glucose-Capillary: 223 mg/dL — ABNORMAL HIGH (ref 70–99)
Glucose-Capillary: 175 mg/dL — ABNORMAL HIGH (ref 70–99)

## 2024-05-03 MED ORDER — INSULIN GLARGINE 100 UNIT/ML ~~LOC~~ SOLN
10.0000 [IU] | Freq: Every day | SUBCUTANEOUS | Status: DC
  Administered 2024-05-03: 10 [IU] via SUBCUTANEOUS
  Filled 2024-05-03 (×2): qty 0.1

## 2024-05-03 MED ORDER — INSULIN ASPART 100 UNIT/ML IJ SOLN
2.0000 [IU] | Freq: Three times a day (TID) | INTRAMUSCULAR | Status: DC
  Administered 2024-05-03 – 2024-05-05 (×5): 2 [IU] via SUBCUTANEOUS

## 2024-05-03 NOTE — Evaluation (Signed)
 Occupational Therapy Evaluation Patient Details Name: Bridget Martinez MRN: [id number] DOB: 1950/10/16 Today's Date: 05/03/2024   History of Present Illness   Bridget Martinez is a 73 y.o. female who presented to [facility] ED 8/23 after mechanical fall at home. CT of thoracic/lumbar spine shows age indeterminant T5 inferior endplate compression fx, acute displaced fx of the posterior left 10th rib, displaced fracture of the left inferior pubic rami, and chronic L2 compression fracture. PMHx: anemia being followed by cancer center at [facility], received a Retacrit  injection on 04/19/24, essential HTN, CHF, diabetes mellitus, GERD, HLD, osteoporosis, and acquired hypothyroidism.     Clinical Impressions Bridget Martinez was evaluated s/p the above admission list. She is indep and lives alone at baseline. Upon evaluation the pt was limited by pain, unsteady balance, weakness and poor activity tolerance. Overall she needed max A for bed mobility, min A to stand and close CGA for pivotal stepping with the RW. Pt declined further functional mobility due to exacerbation of pain. Due to the deficits listed below the pt also needs up to max A for LB ADLs and set up A for UB ADLs. Pt will benefit from continued acute OT services and skilled inpatient follow up therapy, <3 hours/day.      If plan is discharge home, recommend the following:   A little help with walking and/or transfers;A lot of help with bathing/dressing/bathroom;Assistance with cooking/housework;Assist for transportation;Help with stairs or ramp for entrance     Functional Status Assessment   Patient has had a recent decline in their functional status and demonstrates the ability to make significant improvements in function in a reasonable and predictable amount of time.     Equipment Recommendations   Other (comment) (defer)      Precautions/Restrictions   Precautions Precautions: Fall Recall of Precautions/Restrictions:  Intact Restrictions Weight Bearing Restrictions Per Provider Order: Yes LLE Weight Bearing Per Provider Order: Weight bearing as tolerated     Mobility Bed Mobility Overal bed mobility: Needs Assistance Bed Mobility: Supine to Sit     Supine to sit: Max assist     General bed mobility comments: increased time and cues required, pt guarding and hesitant to exacerbate pain    Transfers Overall transfer level: Needs assistance Equipment used: Rolling walker (2 wheels) Transfers: Sit to/from Stand, Bed to chair/wheelchair/BSC Sit to Stand: Min assist Stand pivot transfers: Contact guard assist         General transfer comment: pt declined further mobility      Balance Overall balance assessment: Needs assistance, History of Falls Sitting-balance support: Bilateral upper extremity supported, Feet supported Sitting balance-Leahy Scale: Fair     Standing balance support: Bilateral upper extremity supported, During functional activity, Reliant on assistive device for balance Standing balance-Leahy Scale: Poor           ADL either performed or assessed with clinical judgement   ADL Overall ADL's : Needs assistance/impaired Eating/Feeding: Independent   Grooming: Set up;Sitting   Upper Body Bathing: Set up;Sitting   Lower Body Bathing: Maximal assistance;Sit to/from stand   Upper Body Dressing : Set up;Sitting   Lower Body Dressing: Maximal assistance;Sit to/from stand   Toilet Transfer: Minimal assistance;Stand-pivot;Rolling walker (2 wheels)   Toileting- Clothing Manipulation and Hygiene: Moderate assistance;Sitting/lateral lean;Sit to/from stand       Functional mobility during ADLs: Minimal assistance General ADL Comments: limited by pain, weakness and activity tolerance     Vision Baseline Vision/History: 0 No visual deficits Vision Assessment?: No apparent visual  deficits     Perception Perception: Not tested       Praxis Praxis: Not tested        Pertinent Vitals/Pain Pain Assessment Pain Assessment: Faces Faces Pain Scale: Hurts even more Pain Location: L hip, L ribs Pain Descriptors / Indicators: Aching, Discomfort, Grimacing, Guarding Pain Intervention(s): Limited activity within patient's tolerance, Monitored during session     Extremity/Trunk Assessment Upper Extremity Assessment Upper Extremity Assessment: Generalized weakness   Lower Extremity Assessment Lower Extremity Assessment: Defer to PT evaluation   Cervical / Trunk Assessment Cervical / Trunk Assessment: Kyphotic   Communication Communication Communication: No apparent difficulties   Cognition Arousal: Alert Behavior During Therapy: WFL for tasks assessed/performed Cognition: No apparent impairments             OT - Cognition Comments: over WFL, nto formally assessed       Following commands: Intact       Cueing  General Comments   Cueing Techniques: Verbal cues  BLE dry flaky skin           Home Living Family/patient expects to be discharged to:: Private residence Living Arrangements: Alone Available Help at Discharge: Family;Available PRN/intermittently Type of Home: House Home Access: Stairs to enter [employer] of Steps: 4 Entrance Stairs-Rails: Left Home Layout: One level     Bathroom Shower/Tub: Producer, television/film/video: Standard Bathroom Accessibility: Yes   Home Equipment: Educational psychologist (4 wheels);BSC/3in1;Wheelchair - manual          Prior Functioning/Environment Prior Level of Function : Independent/Modified Independent             Mobility Comments: Ambulates without AD. 1 fall leading to current admission. Has been using rollator since fall. ADLs Comments: Indep with ADLs. Manages her own medications. Son picks up meds from pharmacy and will bring her groceries. She doesn't cook, but can reheat food and do small snacks. Her family brings her supper every night.    OT  Problem List: Decreased strength;Decreased range of motion;Decreased activity tolerance;Impaired balance (sitting and/or standing);Decreased safety awareness;Decreased knowledge of use of DME or AE;Decreased knowledge of precautions;Pain   OT Treatment/Interventions: Self-care/ADL training;Therapeutic exercise;DME and/or AE instruction;Therapeutic activities;Patient/family education;Balance training      OT Goals(Current goals can be found in the care plan section)   Acute Rehab OT Goals Patient Stated Goal: less pain OT Goal Formulation: With patient Time For Goal Achievement: 05/17/24 Potential to Achieve Goals: Good ADL Goals Pt Will Perform Lower Body Dressing: with mod assist;sit to/from stand;with adaptive equipment Pt Will Transfer to Toilet: with supervision;ambulating Pt Will Perform Toileting - Clothing Manipulation and hygiene: with contact guard assist;sitting/lateral leans Additional ADL Goal #1: Pt will complete bed mobility with superivsion A as a precursor to ADLs   OT Frequency:  Min 2X/week       AM-PAC OT 6 Clicks Daily Activity     Outcome Measure Help from another person eating meals?: None Help from another person taking care of personal grooming?: A Little Help from another person toileting, which includes using toliet, bedpan, or urinal?: A Lot Help from another person bathing (including washing, rinsing, drying)?: A Lot Help from another person to put on and taking off regular upper body clothing?: A Little Help from another person to put on and taking off regular lower body clothing?: A Lot 6 Click Score: 16   End of Session Equipment Utilized During Treatment: Gait belt;Rolling walker (2 wheels) Nurse Communication: Mobility status  Activity Tolerance: Patient  tolerated treatment well Patient left: in chair;with call bell/phone within reach;with family/visitor present  OT Visit Diagnosis: Unsteadiness on feet (R26.81);Other abnormalities of gait and  mobility (R26.89);Muscle weakness (generalized) (M62.81);History of falling (Z91.81);Pain                Time: 9181-9163 OT Time Calculation (min): 18 min Charges:  OT General Charges $OT Visit: 1 Visit OT Evaluation $OT Eval Moderate Complexity: 1 Mod  Bridget Martinez, OTR/L Acute Rehabilitation Services Office [phone] Secure Chat Communication Preferred   Bridget Martinez 05/03/2024, 11:12 AM

## 2024-05-03 NOTE — TOC Initial Note (Signed)
 Transition of Care [facility]) - Initial/Assessment Note    Patient Details  Name: Bridget Martinez MRN: [id number] Date of Birth: 05/28/1951  [facility]) CM/SW Contact:    Bridget Cordella Simmonds, LCSW Phone Number: 05/03/2024, 4:09 PM  Clinical Narrative:         CSW met with pt regarding PT recommendation for SNF.  Pt is agreeable, requesting facility in [place] called [facility]--CSW will check with daughter.  Permission given to send out referral in the hub.  Pt from home alone, no current services.  Permission given to speak with son Elsie, daughter [name].   Referral sent out in hub for SNF.            Expected Discharge Plan: Skilled Nursing Facility Barriers to Discharge: Continued Medical Work up, SNF Pending bed offer   Patient Goals and CMS Choice Patient states their goals for this hospitalization and ongoing recovery are:: walk again          Expected Discharge Plan and Services In-house Referral: Clinical Social Work   Post Acute Care Choice: Skilled Nursing Facility Living arrangements for the past 2 months: Single Family Home                                      Prior Living Arrangements/Services Living arrangements for the past 2 months: Single Family Home Lives with:: Self Patient language and need for interpreter reviewed:: Yes Do you feel safe going back to the place where you live?: Yes      Need for Family Participation in Patient Care: Yes (Comment) Care giver support system in place?: Yes (comment) Current home services: Other (comment) (none) Criminal Activity/Legal Involvement Pertinent to Current Situation/Hospitalization: No - Comment as needed  Activities of Daily Living      Permission Sought/Granted Permission sought to share information with : Family Supports Permission granted to share information with : Yes, Verbal Permission Granted  Share Information with NAME: son Elsie, daughter [name]  Permission granted to  share info w AGENCY: SNF        Emotional Assessment Appearance:: Appears stated age Attitude/Demeanor/Rapport: Engaged Affect (typically observed): Appropriate, Pleasant Orientation: : Oriented to Self, Oriented to Place, Oriented to  Time, Oriented to Situation      Admission diagnosis:  Fall, initial encounter [W19.XXXA] Fall at home, initial encounter [phone].[id number], Y92.009] Closed fracture of one rib of left side, initial encounter [S22.32XA] Closed fracture of left pubis, unspecified portion of pubis, initial encounter (HCC) [S32.502A] Patient Active Problem List   Diagnosis Date Noted   Fall at home, initial encounter 05/01/2024   Weight loss, abnormal 11/12/2023   Nausea without vomiting 11/12/2023   Colon cancer screening 08/11/2023   Type 1 diabetes mellitus with hyperglycemia (HCC) 10/15/2021   History of tuberculosis 11/10/2020   Osteoporosis 11/10/2020   Anemia in chronic kidney disease 09/09/2019   Diabetic retinopathy (HCC) 01/28/2019   Hyperlipidemia due to type 1 diabetes mellitus (HCC) 08/14/2018   Onychomycosis of multiple toenails with type 1 diabetes mellitus (HCC) 08/14/2018   GERD (gastroesophageal reflux disease) 02/10/2017   History of non-ST elevation myocardial infarction (NSTEMI)    Diabetic gastroparesis associated with type 1 diabetes mellitus (HCC)    Chronic combined systolic and diastolic congestive heart failure (HCC) 09/19/2014   Acquired hypothyroidism 09/19/2014   Nausea 11/19/2013   CKD (chronic kidney disease) stage 4, GFR 15-29 ml/min (HCC) 11/11/2013  Hypertension associated with diabetes (HCC)    Heart murmur    CAD, multiple vessel 07/27/2011   Recurrent depression (HCC) 07/25/2011   PCP:  Severa [name], FNP Pharmacy:   [facility] [phone] - [place], [place] - [address] [place] [address] Phone: [phone] Fax: [phone]  [facility] [address], [place] - [address] [place] [address] [place] [address] Phone: [phone] Fax: [phone]     Social Drivers of Health (SDOH) Social History: SDOH Screenings   Food Insecurity: No Food Insecurity (04/07/2024)  Housing: Unknown (04/07/2024)  Transportation Needs: No Transportation Needs (04/07/2024)  Utilities: Not At Risk (04/07/2024)  Alcohol Screen: Low Risk  (04/07/2024)  Depression (PHQ2-9): Low Risk  (04/07/2024)  Financial Resource Strain: Low Risk  (04/07/2024)  Physical Activity: Insufficiently Active (04/07/2024)  Social Connections: Socially Isolated (04/07/2024)  Stress: No Stress Concern Present (04/07/2024)  Tobacco Use: Low Risk  (04/07/2024)  Health Literacy: Adequate Health Literacy (04/07/2024)   SDOH Interventions:     Readmission Risk Interventions     No data to display

## 2024-05-03 NOTE — Inpatient Diabetes Management (Signed)
 Inpatient Diabetes Program Recommendations  AACE/ADA: New Consensus Statement on Inpatient Glycemic Control (2015)  Target Ranges:  Prepandial:   less than 140 mg/dL      Peak postprandial:   less than 180 mg/dL (1-2 hours)      Critically ill patients:  140 - 180 mg/dL    Latest Reference Range & Units 05/02/24 06:03 05/02/24 11:51 05/02/24 16:04 05/02/24 22:09  Glucose-Capillary 70 - 99 mg/dL 795 (H)  5 units Novolog   174 (H)  3 units Novolog   215 (H)  5 units Novolog   167 (H)    8 units Lantus   (H): Data is abnormally high  Latest Reference Range & Units 05/03/24 06:09 05/03/24 11:54  Glucose-Capillary 70 - 99 mg/dL 806 (H)  3 units Novolog   228 (H)  5 units Novolog    (H): Data is abnormally high      Home DM Meds: Lantus  12 units at HS     Novolog  9 units BFast/ 16 units Lunch/ 14 units Dinner    Dexcom G6 CGM   Current Orders: Lantus  8 units at HS    Novolog  0-15 units TID      Just started Lantus  last PM Current A1c= 8%--ENDO: Dr. Faythe    MD- Please consider the following:  1. Increase Lantus  to 10 units at HS  2. Start Novolog  Meal Coverage: Novolog  4 units TID with meals HOLD if pt NPO HOLD if pt eats <50% meals     --Will follow patient during hospitalization--  Adina Rudolpho Arrow RN, MSN, CDCES Diabetes Coordinator Inpatient Glycemic Control Team Team Pager: [phone] (8a-5p)

## 2024-05-03 NOTE — Progress Notes (Signed)
 [facility]                                                                               Vidhi Delellis, is a 73 y.o. female, DOB - [birth year], FMW:[id number] Admit date - 05/01/2024    Outpatient Primary MD for the patient is [name], [name], FNP  LOS - 0  days    Brief summary   Bridget Martinez is a 73 y.o. female with past medical history  of anemia being followed by cancer center at [facility] patient received a Retacrit  injection on 19 April 2024, essential hypertension, diabetes mellitus, acquired hypothyroidism being admitted today for a fall and pain.  Per report patient had a mechanical fall where she on Monday, 18 August and fell and slipped on a wet floor on her bottom, no reports of loss of consciousness and started having pain yesterday more so on her lower back with difficulty walking and unable to bear weight because of the pain . patient at baseline uses a walker. She reports some back pain.  X-ray of the lumbar spine shows no acute lumbar spine fracture.  CT of the thoracic and lumbar spine shows: - Age indeterminant T5 inferior endplate compression fracture, -Dextrocurvature of the T-spine. -Thoracic spondylosis - An acute displaced fracture of the posterior left 10th rib. - Chronic L2 compression fracture.  - Displaced fracture of the left inferior pubic rami that is new.   She was admitted for pain control and therapy evaluation.   Assessment & Plan    Assessment and Plan:  Left inferior pubic ramus fracture:  Weight bearing as tolerated.  Outpatient follow up with orthopedics.  PT/OT EVAL recommending SNG.  Pain control with morphine  and tramadol .     Acute displaced fracture of the posterior left 10th rib.  Pain control.  Therapy eval.    Hypertension Optimally controlled.   Type 2 DM CBG (last 3)  Recent Labs    05/02/24 2209 05/03/24 0609 05/03/24 1154  GLUCAP 167* 193* 228*   Resume SSI. Added novolog  2 units TIDAC.  Continue with Lantus  10 units daily.   UTI: Started him on IV ceftriaxone .     Estimated body mass index is 23.54 kg/m as calculated from the following:   Height as of 04/07/24: 5' 4 (1.626 m).   Weight as of this encounter: 62.2 kg.  Code Status:full code.  DVT Prophylaxis:  heparin  injection 5,000 Units Start: 05/01/24 2200   Level of Care: Level of care: Telemetry Medical Family Communication: none at bedside.  Disposition Plan:     Remains inpatient appropriate:  pending.   Procedures:  None.   Consultants:   None.   Antimicrobials:   Anti-infectives (From admission, onward)    Start     Dose/Rate Route Frequency Ordered Stop   05/02/24 2000  cefTRIAXone  (ROCEPHIN ) 1 g in sodium chloride  0.9 % 100 mL IVPB        1 g 200 mL/hr over 30 Minutes Intravenous Every 24 hours 05/02/24 1715          Medications  Scheduled Meds:  aspirin  EC  81 mg Oral Daily   buPROPion   150 mg  Oral BID   carvedilol   6.25 mg Oral BID WC   heparin  injection (subcutaneous)  5,000 Units Subcutaneous Q8H   insulin  aspart  0-15 Units Subcutaneous TID WC   insulin  aspart  2 Units Subcutaneous TID WC   insulin  glargine  10 Units Subcutaneous Q2200   levothyroxine   75 mcg Oral QAC breakfast   pantoprazole   40 mg Oral BID   sodium chloride  flush  3 mL Intravenous Q12H   sodium chloride  flush  3 mL Intravenous Q12H   Continuous Infusions:  cefTRIAXone  (ROCEPHIN )  IV 1 g (05/02/24 2036)   PRN Meds:.acetaminophen  **OR** acetaminophen , sodium chloride  flush, traMADol     Subjective:   Bridget Martinez was seen and examined today.    Objective:   Vitals:   05/03/24 0414 05/03/24 0500 05/03/24 0845 05/03/24 1458  BP: (!) 153/52  (!) 138/45 (!) 154/56  Pulse: 71  65 66  Resp: 17  18 16   Temp: 97.7 F (36.5 C)  97.9 F (36.6 C) (!) 97.2 F (36.2 C)  TempSrc: Oral  Oral Rectal  SpO2: 100%  98% 99%  Weight:  62.2 kg      Intake/Output Summary (Last 24 hours) at 05/03/2024  1546 Last data filed at 05/03/2024 0900 Gross per 24 hour  Intake 360 ml  Output --  Net 360 ml   Filed Weights   05/02/24 0500 05/03/24 0500  Weight: 62.1 kg 62.2 kg     Exam General exam: Appears calm and comfortable  Respiratory system: Clear to auscultation. Respiratory effort normal. Cardiovascular system: S1 & S2 heard, RRR. No JVD, Gastrointestinal system: Abdomen is nondistended, soft and nontender.  Central nervous system: Alert and oriented. No focal neurological deficits. Extremities: Symmetric 5 x 5 power. Skin: No rashes Psychiatry: Mood & affect appropriate.    Data Reviewed:  I have personally reviewed following labs and imaging studies   CBC Lab Results  Component Value Date   WBC 10.6 (H) 05/02/2024   RBC 3.61 (L) 05/02/2024   HGB 10.3 (L) 05/02/2024   HCT 31.7 (L) 05/02/2024   MCV 87.8 05/02/2024   MCH 28.5 05/02/2024   PLT 227 05/02/2024   MCHC 32.5 05/02/2024   RDW 20.4 (H) 05/02/2024   LYMPHSABS 0.8 05/01/2024   MONOABS 1.3 (H) 05/01/2024   EOSABS 0.2 05/01/2024   BASOSABS 0.1 05/01/2024     Last metabolic panel Lab Results  Component Value Date   NA 136 05/02/2024   K 4.0 05/02/2024   CL 103 05/02/2024   CO2 20 (L) 05/02/2024   BUN 50 (H) 05/02/2024   CREATININE 1.92 (H) 05/02/2024   GLUCOSE 193 (H) 05/02/2024   GFRNONAA 27 (L) 05/02/2024   GFRAA 51 (L) 08/14/2018   CALCIUM  8.7 (L) 05/02/2024   PHOS 2.3 (L) 03/08/2015   PROT 6.3 (L) 05/02/2024   ALBUMIN  2.9 (L) 05/02/2024   LABGLOB 2.3 11/21/2023   AGRATIO 2.0 06/07/2022   BILITOT 0.6 05/02/2024   ALKPHOS 87 05/02/2024   AST 18 05/02/2024   ALT 18 05/02/2024   ANIONGAP 13 05/02/2024    CBG (last 3)  Recent Labs    05/02/24 2209 05/03/24 0609 05/03/24 1154  GLUCAP 167* 193* 228*      Coagulation Profile: No results for input(s): INR, PROTIME in the last 168 hours.   Radiology Studies: CT HEAD WO CONTRAST ( ) Result Date: 05/01/2024 CLINICAL DATA:  Status  post fall. EXAM: CT HEAD WITHOUT CONTRAST TECHNIQUE: Contiguous axial images were obtained from the base of  the skull through the vertex without intravenous contrast. RADIATION DOSE REDUCTION: This exam was performed according to the departmental dose-optimization program which includes automated exposure control, adjustment of the mA and/or kV according to patient size and/or use of iterative reconstruction technique. COMPARISON:  November 19, 2014 FINDINGS: Brain: There is generalized cerebral atrophy with widening of the extra-axial spaces and ventricular dilatation. There are areas of decreased attenuation within the white matter tracts of the supratentorial brain, consistent with microvascular disease changes. Vascular: Marked severity bilateral cavernous carotid artery calcification is noted. Skull: Normal. Negative for fracture or focal lesion. Sinuses/Orbits: No acute finding. Other: None. IMPRESSION: 1. Generalized cerebral atrophy and microvascular disease changes of the supratentorial brain. 2. No acute intracranial abnormality. Electronically Signed   By: Suzen Dials M.D.   On: 05/01/2024 19:06       Elgie Butter M.D. [facility] 05/03/2024, 3:46 PM  Available via Epic secure chat 7am-7pm After 7 pm, please refer to night coverage provider listed on amion.

## 2024-05-03 NOTE — NC FL2 (Signed)
 [place]  MEDICAID FL2 LEVEL OF CARE FORM     IDENTIFICATION  Patient Name: Bridget Martinez Birthdate: Apr 05, 1951 Sex: female Admission Date (Current Location): 05/01/2024  [facility] and [place] Number:  Producer, television/film/video and Address:  The [place], [address] [place], [place] [address]      Provider Number: [id number]  Attending Physician Name and Address:  Cherlyn Labella, MD  Relative Name and Phone Number:  BRIGID, VANDEKAMP   [phone]    Current Level of Care: Hospital Recommended Level of Care: Skilled Nursing Facility Prior Approval Number:    Date Approved/Denied:   PASRR Number: [id number]  Discharge Plan: SNF    Current Diagnoses: Patient Active Problem List   Diagnosis Date Noted   Fall at home, initial encounter 05/01/2024   Weight loss, abnormal 11/12/2023   Nausea without vomiting 11/12/2023   Colon cancer screening 08/11/2023   Type 1 diabetes mellitus with hyperglycemia (HCC) 10/15/2021   History of tuberculosis 11/10/2020   Osteoporosis 11/10/2020   Anemia in chronic kidney disease 09/09/2019   Diabetic retinopathy (HCC) 01/28/2019   Hyperlipidemia due to type 1 diabetes mellitus (HCC) 08/14/2018   Onychomycosis of multiple toenails with type 1 diabetes mellitus (HCC) 08/14/2018   GERD (gastroesophageal reflux disease) 02/10/2017   History of non-ST elevation myocardial infarction (NSTEMI)    Diabetic gastroparesis associated with type 1 diabetes mellitus (HCC)    Chronic combined systolic and diastolic congestive heart failure (HCC) 09/19/2014   Acquired hypothyroidism 09/19/2014   Nausea 11/19/2013   CKD (chronic kidney disease) stage 4, GFR 15-29 ml/min (HCC) 11/11/2013   Hypertension associated with diabetes (HCC)    Heart murmur    CAD, multiple vessel 07/27/2011   Recurrent depression (HCC) 07/25/2011    Orientation RESPIRATION BLADDER Height & Weight     Self, Time, Situation, Place  Normal  Incontinent, External catheter Weight: 137 lb 2 oz (62.2 kg) Height:     BEHAVIORAL SYMPTOMS/MOOD NEUROLOGICAL BOWEL NUTRITION STATUS      Incontinent Diet (see discharge summary)  AMBULATORY STATUS COMMUNICATION OF NEEDS Skin   Total Care Verbally Other (Comment) (ecchymosis, redness)                       Personal Care Assistance Level of Assistance  Bathing, Feeding, Dressing Bathing Assistance: Maximum assistance Feeding assistance: Independent Dressing Assistance: Maximum assistance     Functional Limitations Info  Sight, Hearing, Speech Sight Info: Adequate Hearing Info: Adequate Speech Info: Adequate    SPECIAL CARE FACTORS FREQUENCY  PT (By licensed PT), OT (By licensed OT)     PT Frequency: 5x week OT Frequency: 5x week            Contractures Contractures Info: Not present    Additional Factors Info  Code Status, Allergies, Insulin  Sliding Scale Code Status Info: full Allergies Info: Penicillins, Sulfa Antibiotics, Ciprofloxacin , Macrobid  (Nitrofurantoin  Monohyd Macro), Other, Ciprofloxacin  Hcl   Insulin  Sliding Scale Info: Novolog : see discharge summary       Current Medications (05/03/2024):  This is the current hospital active medication list Current Facility-Administered Medications  Medication Dose Route Frequency Provider Last Rate Last Admin   acetaminophen  (TYLENOL ) tablet 650 mg  650 mg Oral Q6H PRN Patel, Ekta V, MD   650 mg at 05/02/24 0516   Or   acetaminophen  (TYLENOL ) suppository 650 mg  650 mg Rectal Q6H PRN Patel, Ekta V, MD       aspirin  EC  tablet 81 mg  81 mg Oral Daily Patel, Ekta V, MD   81 mg at 05/03/24 9144   buPROPion  (WELLBUTRIN  XL) 24 hr tablet 150 mg  150 mg Oral BID Patel, Ekta V, MD   150 mg at 05/03/24 0855   carvedilol  (COREG ) tablet 6.25 mg  6.25 mg Oral BID WC Akula, Vijaya, MD   6.25 mg at 05/03/24 0855   cefTRIAXone  (ROCEPHIN ) 1 g in sodium chloride  0.9 % 100 mL IVPB  1 g Intravenous Q24H Akula, Vijaya, MD 200 mL/hr  at 05/02/24 2036 1 g at 05/02/24 2036   heparin  injection 5,000 Units  5,000 Units Subcutaneous Q8H Patel, Ekta V, MD   5,000 Units at 05/03/24 1508   insulin  aspart (novoLOG ) injection 0-15 Units  0-15 Units Subcutaneous TID WC Patel, Ekta V, MD   5 Units at 05/03/24 1225   insulin  aspart (novoLOG ) injection 2 Units  2 Units Subcutaneous TID WC Akula, Vijaya, MD       insulin  glargine (LANTUS ) injection 10 Units  10 Units Subcutaneous Q2200 Akula, Vijaya, MD       levothyroxine  (SYNTHROID ) tablet 75 mcg  75 mcg Oral QAC breakfast Tobie Mario GAILS, MD   75 mcg at 05/03/24 0526   pantoprazole  (PROTONIX ) EC tablet 40 mg  40 mg Oral BID Paytes, Austin A, RPH   40 mg at 05/03/24 0855   sodium chloride  flush (NS) 0.9 % injection 3 mL  3 mL Intravenous Q12H Tobie Mario GAILS, MD       sodium chloride  flush (NS) 0.9 % injection 3 mL  3 mL Intravenous Q12H Tobie Mario V, MD   3 mL at 05/03/24 0856   sodium chloride  flush (NS) 0.9 % injection 3 mL  3 mL Intravenous PRN Tobie Mario GAILS, MD       traMADol  (ULTRAM ) tablet 50 mg  50 mg Oral Q6H PRN Akula, Vijaya, MD   50 mg at 05/03/24 9473     Discharge Medications: Please see discharge summary for a list of discharge medications.  Relevant Imaging Results:  Relevant Lab Results:   Additional Information SSN: [id number]  Bridget Cordella Simmonds, LCSW

## 2024-05-04 ENCOUNTER — Other Ambulatory Visit: Payer: Self-pay | Admitting: Family Medicine

## 2024-05-04 ENCOUNTER — Encounter (HOSPITAL_COMMUNITY): Payer: Self-pay | Admitting: Internal Medicine

## 2024-05-04 DIAGNOSIS — D631 Anemia in chronic kidney disease: Secondary | ICD-10-CM | POA: Diagnosis not present

## 2024-05-04 DIAGNOSIS — S22050A Wedge compression fracture of T5-T6 vertebra, initial encounter for closed fracture: Secondary | ICD-10-CM | POA: Diagnosis not present

## 2024-05-04 DIAGNOSIS — W19XXXA Unspecified fall, initial encounter: Secondary | ICD-10-CM | POA: Diagnosis not present

## 2024-05-04 DIAGNOSIS — Y92009 Unspecified place in unspecified non-institutional (private) residence as the place of occurrence of the external cause: Secondary | ICD-10-CM | POA: Diagnosis not present

## 2024-05-04 DIAGNOSIS — E1159 Type 2 diabetes mellitus with other circulatory complications: Secondary | ICD-10-CM | POA: Diagnosis not present

## 2024-05-04 DIAGNOSIS — W1839XA Other fall on same level, initial encounter: Secondary | ICD-10-CM | POA: Diagnosis not present

## 2024-05-04 DIAGNOSIS — I152 Hypertension secondary to endocrine disorders: Secondary | ICD-10-CM | POA: Diagnosis not present

## 2024-05-04 DIAGNOSIS — E039 Hypothyroidism, unspecified: Secondary | ICD-10-CM | POA: Diagnosis not present

## 2024-05-04 DIAGNOSIS — K219 Gastro-esophageal reflux disease without esophagitis: Secondary | ICD-10-CM | POA: Diagnosis not present

## 2024-05-04 DIAGNOSIS — N184 Chronic kidney disease, stage 4 (severe): Secondary | ICD-10-CM | POA: Diagnosis not present

## 2024-05-04 DIAGNOSIS — E1043 Type 1 diabetes mellitus with diabetic autonomic (poly)neuropathy: Secondary | ICD-10-CM

## 2024-05-04 LAB — GLUCOSE, CAPILLARY
Glucose-Capillary: 210 mg/dL — ABNORMAL HIGH (ref 70–99)
Glucose-Capillary: 275 mg/dL — ABNORMAL HIGH (ref 70–99)
Glucose-Capillary: 220 mg/dL — ABNORMAL HIGH (ref 70–99)
Glucose-Capillary: 220 mg/dL — ABNORMAL HIGH (ref 70–99)

## 2024-05-04 LAB — URINE CULTURE: Culture: <10000 — AB

## 2024-05-04 MED ORDER — HYDROCERIN EX CREA
TOPICAL_CREAM | Freq: Two times a day (BID) | CUTANEOUS | Status: DC
  Administered 2024-05-04: 1 via TOPICAL
  Filled 2024-05-04: qty 113

## 2024-05-04 MED ORDER — INSULIN GLARGINE 100 UNIT/ML ~~LOC~~ SOLN
15.0000 [IU] | Freq: Every day | SUBCUTANEOUS | Status: DC
  Administered 2024-05-04: 15 [IU] via SUBCUTANEOUS
  Filled 2024-05-04 (×2): qty 0.15

## 2024-05-04 MED ORDER — METOCLOPRAMIDE HCL 5 MG PO TABS
5.0000 mg | ORAL_TABLET | Freq: Three times a day (TID) | ORAL | Status: DC
  Administered 2024-05-04 – 2024-05-05 (×4): 5 mg via ORAL
  Filled 2024-05-04 (×4): qty 1

## 2024-05-04 NOTE — Plan of Care (Addendum)
 Pt is alert and oriented x 4. Limited movement. Refused mobility and even turning during overnight. Pt agreed to turn at 0400 vitals. Purewick in place. Vitals stable. BS ac hs. Pt complaint of nausea at hs. On call provider notified and provider unable to order home medication due to QTC.  Problem: Education: Goal: Knowledge of General Education information will improve Description: Including pain rating scale, medication(s)/side effects and non-pharmacologic comfort measures Outcome: Progressing   Problem: Health Behavior/Discharge Planning: Goal: Ability to manage health-related needs will improve Outcome: Progressing   Problem: Clinical Measurements: Goal: Ability to maintain clinical measurements within normal limits will improve Outcome: Progressing Goal: Will remain free from infection Outcome: Progressing Goal: Diagnostic test results will improve Outcome: Progressing Goal: Respiratory complications will improve Outcome: Progressing Goal: Cardiovascular complication will be avoided Outcome: Progressing   Problem: Activity: Goal: Risk for activity intolerance will decrease Outcome: Progressing   Problem: Nutrition: Goal: Adequate nutrition will be maintained Outcome: Progressing   Problem: Coping: Goal: Level of anxiety will decrease Outcome: Progressing   Problem: Elimination: Goal: Will not experience complications related to bowel motility Outcome: Progressing Goal: Will not experience complications related to urinary retention Outcome: Progressing   Problem: Pain Managment: Goal: General experience of comfort will improve and/or be controlled Outcome: Progressing   Problem: Safety: Goal: Ability to remain free from injury will improve Outcome: Progressing   Problem: Skin Integrity: Goal: Risk for impaired skin integrity will decrease Outcome: Progressing   Problem: Education: Goal: Ability to describe self-care measures that may prevent or decrease  complications (Diabetes Survival Skills Education) will improve Outcome: Progressing   Problem: Coping: Goal: Ability to adjust to condition or change in health will improve Outcome: Progressing   Problem: Fluid Volume: Goal: Ability to maintain a balanced intake and output will improve Outcome: Progressing   Problem: Health Behavior/Discharge Planning: Goal: Ability to identify and utilize available resources and services will improve Outcome: Progressing Goal: Ability to manage health-related needs will improve Outcome: Progressing   Problem: Metabolic: Goal: Ability to maintain appropriate glucose levels will improve Outcome: Progressing   Problem: Nutritional: Goal: Maintenance of adequate nutrition will improve Outcome: Progressing Goal: Progress toward achieving an optimal weight will improve Outcome: Progressing   Problem: Skin Integrity: Goal: Risk for impaired skin integrity will decrease Outcome: Progressing   Problem: Tissue Perfusion: Goal: Adequacy of tissue perfusion will improve Outcome: Progressing

## 2024-05-04 NOTE — Progress Notes (Signed)
 Physical Therapy Treatment Patient Details Name: Bridget Martinez MRN: [id number] DOB: 30-Sep-1950 Today's Date: 05/04/2024   History of Present Illness Bridget Martinez is a 73 y.o. female who presented to [facility] ED 8/23 after mechanical fall at home. CT of thoracic/lumbar spine shows age indeterminant T5 inferior endplate compression fx, acute displaced fx of the posterior left 10th rib, displaced fracture of the left inferior pubic rami, and chronic L2 compression fracture. PMHx: anemia being followed by cancer center at [facility], received a Retacrit  injection on 04/19/24, essential HTN, CHF, diabetes mellitus, GERD, HLD, osteoporosis, and acquired hypothyroidism.    PT Comments  Pt continues with bilat LE pain and back pain limiting pt ability to ambulate. Pt did demo increased standing tolerance this date with use of RW. During hygiene s/p BM pt found to have open wound draining blood. RN present and addressed, MD team notified. Pt remains appropriate for inpatient rehab program < 3 hrs a day to allow for maximal functional recovery for safe transition home. Acute PT to cont to follow.    If plan is discharge home, recommend the following: A lot of help with walking and/or transfers;A lot of help with bathing/dressing/bathroom;Assistance with cooking/housework;Assist for transportation;Help with stairs or ramp for entrance   Can travel by private vehicle     No  Equipment Recommendations  Rolling walker (2 wheels)    Recommendations for Other Services OT consult     Precautions / Restrictions Precautions Precautions: Fall Recall of Precautions/Restrictions: Intact Restrictions Weight Bearing Restrictions Per Provider Order: Yes LLE Weight Bearing Per Provider Order: Weight bearing as tolerated     Mobility  Bed Mobility Overal bed mobility: Needs Assistance Bed Mobility: Supine to Sit     Supine to sit: Max assist     General bed mobility comments: increased time and cues  required, with max verbal cues pt able to bring LEs towards EOB, maxA for trunk elevation and to scoot to EOB    Transfers Overall transfer level: Needs assistance Equipment used: Rolling walker (2 wheels) Transfers: Sit to/from Stand, Bed to chair/wheelchair/BSC Sit to Stand: Mod assist   Step pivot transfers: Mod assist       General transfer comment: pt completed 2 step pvt transfers from EOB to [facility] then to chair. Pt with decreased L LE WBing tolerance and short R step height and length, modA for walker management and max directional verbal cues    Ambulation/Gait               General Gait Details: unable due to pain   Stairs             Wheelchair Mobility     Tilt Bed    Modified Rankin (Stroke Patients Only)       Balance Overall balance assessment: Needs assistance, History of Falls Sitting-balance support: Bilateral upper extremity supported, Feet supported Sitting balance-Leahy Scale: Fair Sitting balance - Comments: Pt sat EOB with CGA-close supervision.   Standing balance support: Bilateral upper extremity supported, During functional activity, Reliant on assistive device for balance Standing balance-Leahy Scale: Poor Standing balance comment: Pt dependent on RW and modA.                            Communication Communication Communication: No apparent difficulties  Cognition Arousal: Alert Behavior During Therapy: WFL for tasks assessed/performed   PT - Cognitive impairments: No apparent impairments  PT - Cognition Comments: Pt A,Ox4, delayed processing, distracted by pain Following commands: Intact      Cueing Cueing Techniques: Verbal cues  Exercises General Exercises - Lower Extremity Ankle Circles/Pumps: AROM, Both, 5 reps, Seated Long Arc Quad: AROM, Both, 5 reps, Seated    General Comments General comments (skin integrity, edema, etc.): Bilat lower LEs with dry flaky skin. pt s/p  BM, pt dependent for hygiene. During hygiene pt found to have open wound at top of crack that was draining blood, RN present and addressed it and notified medical team      Pertinent Vitals/Pain Pain Assessment Pain Assessment: Faces Faces Pain Scale: Hurts even more Pain Location: L hip, L ribs, back Pain Descriptors / Indicators: Aching, Discomfort, Grimacing, Guarding    Home Living                          Prior Function            PT Goals (current goals can now be found in the care plan section) Acute Rehab PT Goals Patient Stated Goal: Have less pain and get back to moving PT Goal Formulation: With patient Time For Goal Achievement: 05/16/24 Potential to Achieve Goals: Good Progress towards PT goals: Progressing toward goals    Frequency    Min 2X/week      PT Plan      Co-evaluation              AM-PAC PT 6 Clicks Mobility   Outcome Measure  Help needed turning from your back to your side while in a flat bed without using bedrails?: A Lot Help needed moving from lying on your back to sitting on the side of a flat bed without using bedrails?: A Lot Help needed moving to and from a bed to a chair (including a wheelchair)?: A Lot Help needed standing up from a chair using your arms (e.g., wheelchair or bedside chair)?: A Lot Help needed to walk in hospital room?: Total Help needed climbing 3-5 steps with a railing? : Total 6 Click Score: 10    End of Session Equipment Utilized During Treatment: Gait belt Activity Tolerance: Patient tolerated treatment well;Patient limited by pain Patient left: in chair;with call bell/phone within reach;with chair alarm set Nurse Communication: Mobility status;Patient requests pain meds PT Visit Diagnosis: Difficulty in walking, not elsewhere classified (R26.2);Other abnormalities of gait and mobility (R26.89);Unsteadiness on feet (R26.81);History of falling (Z91.81);Pain Pain - Right/Left: Left Pain - part  of body: Hip (Rib)     Time: 9060-8994 PT Time Calculation (min) (ACUTE ONLY): 26 min  Charges:    $Gait Training: 8-22 mins $Therapeutic Activity: 8-22 mins PT General Charges $$ ACUTE PT VISIT: 1 Visit                     Norene Ames, PT, DPT Acute Rehabilitation Services Secure chat preferred Office #: [phone]    Norene CHRISTELLA Ames 05/04/2024, 1:25 PM

## 2024-05-04 NOTE — Consult Note (Signed)
 Chief Complaint: Patient was seen in consultation today for T5 compression fracture  Referring Physician(s): Dr. Elgie Butter  Supervising Physician: Jenna Hacker  Patient Status: [facility] - In-pt  History of Present Illness: Bridget Martinez is a 73 y.o. female with past medical history of CHF, CAD, DM, GERD, HTN, HLD, MI s/p CABG 2013, venous stasis ulcers, and open sacral wound who presented to [facility] ED after mechanical fall at home 5 days complaining of worsening pain. CT Lumbar and Thoracic Spine show several age indeterminate fractures including a T5 compression fracture, chronic L2 compression fracture, and a displaced pubic ramus fracture.   IR consulted for a vertebroplasty/kyphoplasty of the T5 compression fracture.   IR APP to room with patient and her son.  She reports back pain in the lower lumbar spine particularly with movement.  She is sitting up in the recliner and is able to find comfortable positions.  She did receive tramadol  this AM with good relief especially when at rest.  Of note, she did refuse/limit participation with PT today due to pain.    Past Medical History:  Diagnosis Date   Arthritis    CHF (congestive heart failure) (HCC)    Coronary artery disease    angina.  MI.    Depression    anxiety   Diabetes mellitus    on insulin , with h/o DKA    GERD (gastroesophageal reflux disease)    Hiatal hernia.    HTN (hypertension)    Hyperlipidemia    Hypothyroidism    Left displaced femoral neck fracture (HCC) 11/19/2014   Myocardial infarction [facility])    NSTEMI 07/2011 with cardiogenic shock, s/p CABG 09/2011   Osteoporosis    Pneumonia        Tuberculosis    Venous stasis ulcers (HCC)     Past Surgical History:  Procedure Laterality Date   CARDIAC CATHETERIZATION     COLONOSCOPY N/A 11/18/2013   Procedure: COLONOSCOPY;  Surgeon: Princella CHRISTELLA Nida, MD;  Location: [facility] ENDOSCOPY;  Service: Endoscopy;  Laterality: N/A;   CORONARY ARTERY BYPASS GRAFT      Dr. Fleeta Trigt in 09/2011    CORONARY ARTERY BYPASS GRAFT  2012   ESOPHAGOGASTRODUODENOSCOPY N/A 11/17/2013   Procedure: ESOPHAGOGASTRODUODENOSCOPY (EGD);  Surgeon: Princella CHRISTELLA Nida, MD;  Location: [facility] ENDOSCOPY;  Service: Endoscopy;  Laterality: N/A;   LEFT HEART CATHETERIZATION WITH CORONARY ANGIOGRAM N/A 07/26/2011   Procedure: LEFT HEART CATHETERIZATION WITH CORONARY ANGIOGRAM;  Surgeon: Ezra GORMAN Shuck, MD;  Location: [facility] CATH LAB;  Service: Cardiovascular;  Laterality: N/A;   RIGHT HEART CATHETERIZATION N/A 07/29/2011   Procedure: RIGHT HEART CATH;  Surgeon: Lynwood Schilling, MD;  Location: [facility] CATH LAB;  Service: Cardiovascular;  Laterality: N/A;   TONSILLECTOMY     TOTAL HIP ARTHROPLASTY Left 11/21/2014   Procedure: TOTAL HIP ARTHROPLASTY ANTERIOR APPROACH;  Surgeon: Lonni CINDERELLA Poli, MD;  Location: [facility] OR;  Service: Orthopedics;  Laterality: Left;    Allergies: Penicillins, Sulfa antibiotics, Ciprofloxacin , Macrobid  [nitrofurantoin  monohyd macro], Other, and Ciprofloxacin  hcl  Medications: Prior to Admission medications   Medication Sig Start Date End Date Taking? Authorizing Provider  aspirin  81 MG EC tablet Take 1 tablet (81 mg total) by mouth daily. 12/13/16  Yes [name], Toribio SAUNDERS, MD  buPROPion  (WELLBUTRIN  XL) 150 MG 24 hr tablet Take 1 tablet (150 mg total) by mouth 2 (two) times daily. 11/21/23  Yes [name], Rock CHRISTELLA, FNP  carvedilol  (COREG ) 12.5 MG tablet TAKE 1 TABLET (12.5MG  TOTAL) BY MOUTH TWICE  A DAY WITH MEALS 09/26/23  Yes [name], Toribio SAUNDERS, MD  ferrous sulfate  325 (65 FE) MG EC tablet Take 1 tablet (325 mg total) by mouth every other day. 08/11/23  Yes Kandala, Hyndavi, MD  furosemide  (LASIX ) 20 MG tablet Take 20 mg by mouth once a week. 12/12/23  Yes [provider]  insulin  aspart (NOVOLOG  FLEXPEN) 100 UNIT/ML FlexPen Inject 9-16 Units into the skin 3 (three) times daily with meals. Take 9 units at breakfast, 16 units at lunch, 14 units at bedtime 09/21/19  Yes [provider]  LANTUS  SOLOSTAR 100 UNIT/ML Solostar Pen Inject 12 Units into the skin at bedtime. 09/23/23  Yes [provider]  levothyroxine  (SYNTHROID ) 75 MCG tablet Take 75 mcg by mouth daily before breakfast.   Yes [provider]  ondansetron  (ZOFRAN ) 4 MG tablet Take 4 mg by mouth every 8 (eight) hours as needed. 10/03/23  Yes [provider]  pantoprazole  (PROTONIX ) 40 MG tablet Take 1 tablet (40 mg total) by mouth daily. 11/21/23  Yes [name], [name], FNP  potassium chloride  (KLOR-CON ) 20 MEQ packet Take 20 mEq by mouth 2 (two) times daily.   Yes [provider]  sodium bicarbonate  650 MG tablet Take 650 mg by mouth 3 (three) times daily. 02/16/22  Yes [provider]  Continuous Blood Gluc Receiver (DEXCOM G6 RECEIVER) DEVI See admin instructions. 06/01/20   [provider]  Continuous Blood Gluc Sensor (DEXCOM G6 SENSOR) MISC See admin instructions. 06/01/20   [provider]  epoetin  alfa (EPOGEN ) 3000 UNIT/ML injection Inject into the skin. 02/20/22   [provider]  metoCLOPramide  (REGLAN ) 5 MG tablet TAKE 1 TABLET BY MOUTH 3 TIMES DAILY BEFORE MEALS. 05/04/24   Severa [name], FNP  mirtazapine  (REMERON ) 15 MG tablet Take 1 tablet (15 mg total) by mouth at bedtime. Patient not taking: Reported on 05/01/2024 11/21/23   Severa [name], FNP  rosuvastatin  (CRESTOR ) 40 MG tablet Take 1 tablet (40 mg total) by mouth daily. Patient not taking: Reported on 05/01/2024 11/21/23   Severa [name], FNP     Family History  Problem Relation Age of Onset   Heart disease Father    Multiple sclerosis Father    Hypertension Mother    Hyperthyroidism Mother    Diabetes Mother    Heart attack Paternal Grandfather    Heart disease Paternal Grandfather    Diabetes Cousin        Multiple maternal cousins with type 2 diabetes mellitus   Diabetes Maternal Uncle        Type 1 diabetes mellitus   Healthy Daughter    Asthma Son    Diabetes  Son     Social History   Socioeconomic History   Marital status: Widowed    Spouse name: Abby; Son Zell   Number of children: 2   Years of education: 18   Highest education level: Bachelor's degree (e.g., BA, AB, BS)  Occupational History   Occupation: Retired  Tobacco Use   Smoking status: Never   Smokeless tobacco: Never  Vaping Use   Vaping status: Never Used  Substance and Sexual Activity   Alcohol use: No   Drug use: No   Sexual activity: Not on file  Other Topics Concern   Not on file  Social History Narrative   Lives alone, suffers from depression since losing her husband. Son, Zell takes her to appointments and helps her a lot.   Social Drivers of Health  Financial Resource Strain: Low Risk  (04/07/2024)   Overall Financial Resource Strain (CARDIA)    Difficulty of Paying Living Expenses: Not hard at all  Food Insecurity: No Food Insecurity (05/04/2024)   Hunger Vital Sign    Worried About Running Out of Food in the Last Year: Never true    Ran Out of Food in the Last Year: Never true  Transportation Needs: No Transportation Needs (05/04/2024)   PRAPARE - Administrator, Civil Service (Medical): No    Lack of Transportation (Non-Medical): No  Physical Activity: Insufficiently Active (04/07/2024)   Exercise Vital Sign    Days of Exercise per Week: 3 days    Minutes of Exercise per Session: 30 min  Stress: No Stress Concern Present (04/07/2024)   [employer] of Occupational Health - Occupational Stress Questionnaire    Feeling of Stress: Not at all  Social Connections: Socially Isolated (05/04/2024)   Social Connection and Isolation Panel    Frequency of Communication with Friends and Family: More than three times a week    Frequency of Social Gatherings with Friends and Family: More than three times a week    Attends Religious Services: Never    Database administrator or Organizations: No    Attends Banker Meetings: Never     Marital Status: Widowed     Review of Systems: A 12 point ROS discussed and pertinent positives are indicated in the HPI above.  All other systems are negative.  Review of Systems  Constitutional:  Negative for fatigue and fever.  Respiratory:  Negative for cough and shortness of breath.   Cardiovascular:  Negative for chest pain.  Gastrointestinal:  Negative for abdominal pain, nausea and vomiting.  Musculoskeletal:  Positive for back pain (lower back).  Psychiatric/Behavioral:  Negative for behavioral problems and confusion.     Vital Signs: BP (!) 135/44 (BP Location: Right Arm)   Pulse 66   Temp 98.2 F (36.8 C) (Oral)   Resp 18   Ht 5' 6 (1.676 m)   Wt 131 lb 6.3 oz (59.6 kg)   SpO2 99%   BMI 21.21 kg/m   Physical Exam Vitals and nursing note reviewed.  Constitutional:      General: She is not in acute distress.    Appearance: Normal appearance. She is not ill-appearing.  HENT:     Mouth/Throat:     Mouth: Mucous membranes are moist.     Pharynx: Oropharynx is clear.  Cardiovascular:     Rate and Rhythm: Normal rate and regular rhythm.  Pulmonary:     Effort: Pulmonary effort is normal. No respiratory distress.     Breath sounds: Normal breath sounds.  Abdominal:     General: Abdomen is flat. There is no distension.  Musculoskeletal:     Comments: Assessment of the entire length of the spine does not demonstrate point tenderness at any vertebrae.  Patient complains of pain in the sacral spine.   Skin:    General: Skin is warm.  Neurological:     General: No focal deficit present.     Mental Status: She is alert.  Psychiatric:        Mood and Affect: Mood normal.        Behavior: Behavior normal.      Imaging: CT HEAD WO CONTRAST ( ) Result Date: 05/01/2024 CLINICAL DATA:  Status post fall. EXAM: CT HEAD WITHOUT CONTRAST TECHNIQUE: Contiguous axial images were obtained from the base of the  skull through the vertex without intravenous contrast.  RADIATION DOSE REDUCTION: This exam was performed according to the departmental dose-optimization program which includes automated exposure control, adjustment of the mA and/or kV according to patient size and/or use of iterative reconstruction technique. COMPARISON:  November 19, 2014 FINDINGS: Brain: There is generalized cerebral atrophy with widening of the extra-axial spaces and ventricular dilatation. There are areas of decreased attenuation within the white matter tracts of the supratentorial brain, consistent with microvascular disease changes. Vascular: Marked severity bilateral cavernous carotid artery calcification is noted. Skull: Normal. Negative for fracture or focal lesion. Sinuses/Orbits: No acute finding. Other: None. IMPRESSION: 1. Generalized cerebral atrophy and microvascular disease changes of the supratentorial brain. 2. No acute intracranial abnormality. Electronically Signed   By: Suzen Dials M.D.   On: 05/01/2024 19:06   CT Lumbar Spine Wo Contrast Result Date: 05/01/2024 CLINICAL DATA:  Low back pain, increased fracture risk. Low back pain, trauma. EXAM: CT THORACIC AND LUMBAR SPINE WITHOUT CONTRAST TECHNIQUE: Multidetector CT imaging of the thoracic and lumbar spine was performed without contrast. Multiplanar CT image reconstructions were also generated. RADIATION DOSE REDUCTION: This exam was performed according to the departmental dose-optimization program which includes automated exposure control, adjustment of the mA and/or kV according to patient size and/or use of iterative reconstruction technique. COMPARISON:  CT abdomen/pelvis 07/30/2014. FINDINGS: CT THORACIC SPINE FINDINGS Alignment: Dextrocurvature of the thoracic spine. Trace bony retropulsion at the level of the T5 inferior endplate. Vertebrae: Age-indeterminate T5 inferior endplate vertebral compression fracture (with 50-60% vertebral body height loss). Trace bony retropulsion at the level of the T5 inferior endplate.  No other thoracic spine fracture is identified. Paraspinal and other soft tissues: Prior median sternotomy. Cardiomegaly. Low-density cardiac blood pool suggesting anemia. Coronary artery atherosclerosis. Aortic atherosclerosis. No paraspinal mass or collection. Disc levels: Multilevel disc space narrowing, greatest at T5-T6, T9-T10 and T10-T11 (moderate at these levels). No spinal canal stenosis is appreciated. No high-grade bony neural foraminal narrowing. Other: Acute, mildly displaced fracture of the posterior left tenth rib. CT LUMBAR SPINE FINDINGS Segmentation: 5 lumbar vertebrae. The caudal most well-formed intervertebral disc space is designated L5-S1. Alignment: Levocurvature of the lumbar spine. Slight L1-L2 grade 1 retrolisthesis. 3 mm bony retropulsion at the level of the L2 inferior endplate. 2 mm L5-S1 grade 1 anterolisthesis. Vertebrae: Chronic L2 inferior endplate vertebral compression fracture (with up to 50% vertebral body height loss), unchanged from the prior CT abdomen/pelvis of 07/30/2014. No other lumbar spine fracture is identified. Paraspinal and other soft tissues: No acute finding within included portions of the abdomen/retroperitoneum. No paraspinal mass or collection. Disc levels: Disc space narrowing is greatest at L2-L3 (moderate at this level). T12-L1: No significant spinal canal or foraminal stenosis. L1-L2: Slight grade 1 retrolisthesis. No significant spinal canal or foraminal stenosis. L2-L3: Mild bony retropulsion at the level of the L2 inferior endplate. Left foraminal disc protrusion. No significant spinal canal stenosis. Mild-to-moderate left neural foraminal narrowing. L3-L4: Disc bulge. Ligamentum flavum hypertrophy. No significant spinal canal stenosis. Mild bilateral neural foraminal narrowing. L4-L5: Disc bulge. Superimposed broad-based right subarticular/foraminal disc protrusion. Ligamentum flavum hypertrophy. Right subarticular stenosis. Moderate central canal  stenosis. Moderate severe right neural foraminal narrowing. Mild left neural foraminal narrowing. L5-S1: Mild grade 1 anterolisthesis. Disc bulge. No significant spinal canal or foraminal stenosis. Other: Displaced fracture of the left inferior pubic ramus, new as compared to the CT abdomen/pelvis of 07/30/2014 but otherwise age-indeterminate. Partially imaged left hip prosthesis. IMPRESSION: Thoracic spine: 1. Age-indeterminate T5 inferior endplate  compression fracture (50-60% vertebral body height loss). 2. Dextrocurvature of the thoracic spine. 3. Thoracic spondylosis as described. 4. Acute, displaced fracture of the posterior left tenth rib. 5. Cardiomegaly. 6. Low-density cardiac blood pool suggesting anemia. 7. Coronary artery atherosclerosis. 8. Aortic Atherosclerosis (ICD10-I70.0). Lumbar spine: 1. Chronic L2 inferior endplate vertebral compression fracture with unchanged height loss as compared to the prior CT abdomen/pelvis of 07/30/2014 (up to 50%). Mild bony retropulsion at the level of the L2 inferior endplate. 2. Displaced fracture of the left inferior pubic ramus, new from the prior CT abdomen/pelvis of 07/30/2014 but otherwise age-indeterminate. 3. Lumbar spondylosis as described. 4. Levocurvature of the lumbar spine. 5. Mild multilevel grade 1 spondylolisthesis. Electronically Signed   By: Rockey Childs D.O.   On: 05/01/2024 12:59   CT Thoracic Spine Wo Contrast Result Date: 05/01/2024 CLINICAL DATA:  Low back pain, increased fracture risk. Low back pain, trauma. EXAM: CT THORACIC AND LUMBAR SPINE WITHOUT CONTRAST TECHNIQUE: Multidetector CT imaging of the thoracic and lumbar spine was performed without contrast. Multiplanar CT image reconstructions were also generated. RADIATION DOSE REDUCTION: This exam was performed according to the departmental dose-optimization program which includes automated exposure control, adjustment of the mA and/or kV according to patient size and/or use of iterative  reconstruction technique. COMPARISON:  CT abdomen/pelvis 07/30/2014. FINDINGS: CT THORACIC SPINE FINDINGS Alignment: Dextrocurvature of the thoracic spine. Trace bony retropulsion at the level of the T5 inferior endplate. Vertebrae: Age-indeterminate T5 inferior endplate vertebral compression fracture (with 50-60% vertebral body height loss). Trace bony retropulsion at the level of the T5 inferior endplate. No other thoracic spine fracture is identified. Paraspinal and other soft tissues: Prior median sternotomy. Cardiomegaly. Low-density cardiac blood pool suggesting anemia. Coronary artery atherosclerosis. Aortic atherosclerosis. No paraspinal mass or collection. Disc levels: Multilevel disc space narrowing, greatest at T5-T6, T9-T10 and T10-T11 (moderate at these levels). No spinal canal stenosis is appreciated. No high-grade bony neural foraminal narrowing. Other: Acute, mildly displaced fracture of the posterior left tenth rib. CT LUMBAR SPINE FINDINGS Segmentation: 5 lumbar vertebrae. The caudal most well-formed intervertebral disc space is designated L5-S1. Alignment: Levocurvature of the lumbar spine. Slight L1-L2 grade 1 retrolisthesis. 3 mm bony retropulsion at the level of the L2 inferior endplate. 2 mm L5-S1 grade 1 anterolisthesis. Vertebrae: Chronic L2 inferior endplate vertebral compression fracture (with up to 50% vertebral body height loss), unchanged from the prior CT abdomen/pelvis of 07/30/2014. No other lumbar spine fracture is identified. Paraspinal and other soft tissues: No acute finding within included portions of the abdomen/retroperitoneum. No paraspinal mass or collection. Disc levels: Disc space narrowing is greatest at L2-L3 (moderate at this level). T12-L1: No significant spinal canal or foraminal stenosis. L1-L2: Slight grade 1 retrolisthesis. No significant spinal canal or foraminal stenosis. L2-L3: Mild bony retropulsion at the level of the L2 inferior endplate. Left foraminal disc  protrusion. No significant spinal canal stenosis. Mild-to-moderate left neural foraminal narrowing. L3-L4: Disc bulge. Ligamentum flavum hypertrophy. No significant spinal canal stenosis. Mild bilateral neural foraminal narrowing. L4-L5: Disc bulge. Superimposed broad-based right subarticular/foraminal disc protrusion. Ligamentum flavum hypertrophy. Right subarticular stenosis. Moderate central canal stenosis. Moderate severe right neural foraminal narrowing. Mild left neural foraminal narrowing. L5-S1: Mild grade 1 anterolisthesis. Disc bulge. No significant spinal canal or foraminal stenosis. Other: Displaced fracture of the left inferior pubic ramus, new as compared to the CT abdomen/pelvis of 07/30/2014 but otherwise age-indeterminate. Partially imaged left hip prosthesis. IMPRESSION: Thoracic spine: 1. Age-indeterminate T5 inferior endplate compression fracture (50-60% vertebral body height  loss). 2. Dextrocurvature of the thoracic spine. 3. Thoracic spondylosis as described. 4. Acute, displaced fracture of the posterior left tenth rib. 5. Cardiomegaly. 6. Low-density cardiac blood pool suggesting anemia. 7. Coronary artery atherosclerosis. 8. Aortic Atherosclerosis (ICD10-I70.0). Lumbar spine: 1. Chronic L2 inferior endplate vertebral compression fracture with unchanged height loss as compared to the prior CT abdomen/pelvis of 07/30/2014 (up to 50%). Mild bony retropulsion at the level of the L2 inferior endplate. 2. Displaced fracture of the left inferior pubic ramus, new from the prior CT abdomen/pelvis of 07/30/2014 but otherwise age-indeterminate. 3. Lumbar spondylosis as described. 4. Levocurvature of the lumbar spine. 5. Mild multilevel grade 1 spondylolisthesis. Electronically Signed   By: Rockey Childs D.O.   On: 05/01/2024 12:59   DG Lumbar Spine Complete Result Date: 05/01/2024 CLINICAL DATA:  Fall.  Pain. EXAM: LUMBAR SPINE - COMPLETE 4+ VIEW COMPARISON:  None. FINDINGS: Bones are diffusely  demineralized. Mild convex rightward lumbar scoliosis evident. The bony demineralization and substantial overlying bowel gas limits assessment. Loss of disc height noted at L3-4. Superior endplate deformity at L3 appears chronic and may be a Schmorl's node. Atherosclerotic calcification is noted in the abdominal aorta. IMPRESSION: No definite acute lumbar spine fracture by x-ray. See report for dedicated lumbar spine CT dictated separately. Electronically Signed   By: Camellia Candle M.D.   On: 05/01/2024 12:48    Labs:  CBC: Recent Labs    04/19/24 0933 05/01/24 1227 05/02/24 1212 05/03/24 2010  WBC 7.6 11.3* 10.6* 9.5  HGB 10.2* 9.8* 10.3* 10.6*  HCT 31.4* 30.7* 31.7* 32.7*  PLT 235 212 227 275    COAGS: No results for input(s): INR, APTT in the last 8760 hours.  BMP: Recent Labs    12/08/23 1258 03/04/24 1021 05/01/24 1227 05/02/24 1212  NA 134* 135 138 136  K 5.0 4.6 3.7 4.0  CL 101 100 104 103  CO2 21* 22 23 20*  GLUCOSE 256* 290* 189* 193*  BUN 43* 42* 51* 50*  CALCIUM  9.3 9.4 8.9 8.7*  CREATININE 2.29* 1.90* 2.19* 1.92*  GFRNONAA 22* 28* 23* 27*    LIVER FUNCTION TESTS: Recent Labs    12/08/23 1258 03/04/24 1021 05/01/24 2253 05/02/24 1212  BILITOT 0.6 0.9 1.0 0.6  AST 32 24 18 18   ALT 31 28 18 18   ALKPHOS 74 96 84 87  PROT 7.1 7.3 6.4* 6.3*  ALBUMIN  3.8 3.8 3.0* 2.9*    TUMOR MARKERS: No results for input(s): AFPTM, CEA, CA199, CHROMGRNA in the last 8760 hours.  Assessment and Plan: T5 compression fracture Patient s/p mechanical fall with acute onset back pain and several age indeterminate fractures identified by CT imaging.  IR asked to evaluate for vertebroplasty/kyphoplasty.  PA to bedside to discuss with patient and her son.  She has no point tenderness in the mid or lower back.  Discussed the merits of procedure especially given the primary indication is pain relief and she does not describe any in the affected area.  Recommend  continue current management and will place contact information for IR on her discharge paperwork in the event she demonstrates focal pain in the mid back not improved with medication and therapy alone.   Ordering MD made aware.    Thank you for this interesting consult.  I greatly enjoyed meeting AADHIRA HEFFERNAN and look forward to participating in their care.  A copy of this report was sent to the requesting provider on this date.  Electronically Signed: Katlyne Nishida Sue-Ellen Arista Kettlewell,  PA 05/04/2024, 2:18 PM   I spent a total of 40 Minutes    in face to face in clinical consultation, greater than 50% of which was counseling/coordinating care for T5 compression fracture.

## 2024-05-04 NOTE — Progress Notes (Signed)
 [facility]                                                                               [name], is a 73 y.o. female, DOB - 1951/08/06, FMW:[id number] Admit date - 05/01/2024    Outpatient Primary MD for the patient is [name], [name], FNP  LOS - 0  days    Brief summary   DNIYAH Martinez is a 73 y.o. female with past medical history  of anemia being followed by cancer center at [facility] patient received a Retacrit  injection on 19 April 2024, essential hypertension, diabetes mellitus, acquired hypothyroidism being admitted today for a fall and pain.  Per report patient had a mechanical fall where she on Monday, 18 August and fell and slipped on a wet floor on her bottom, no reports of loss of consciousness and started having pain yesterday more so on her lower back with difficulty walking and unable to bear weight because of the pain . patient at baseline uses a walker. She reports some back pain.  X-ray of the lumbar spine shows no acute lumbar spine fracture.  CT of the thoracic and lumbar spine shows: - Age indeterminant T5 inferior endplate compression fracture, -Dextrocurvature of the T-spine. -Thoracic spondylosis - An acute displaced fracture of the posterior left 10th rib. - Chronic L2 compression fracture.  - Displaced fracture of the left inferior pubic rami that is new.   She was admitted for pain control and therapy evaluation.   Assessment & Plan    Assessment and Plan:  Left inferior pubic ramus fracture:  Weight bearing as tolerated.  Outpatient follow up with orthopedics with Dr Germaine in 3 to 4 weeks.  PT/OT EVAL recommending SNF Pain control with tramadol .     Acute displaced fracture of the posterior left 10th rib.  Pain control.  Therapy eval.    Age in determinant T5 inferior endplate compression fracture IR consulted to see if she is a candidate for kypho, recommended outpatient follow up if med back pain is not improved  with therapy and medication.    Hypertension Optimally controlled.  Resume coreg  6.25 mg BID.   Type 2 DM with hyperglycemia.  CBG (last 3)  Recent Labs    05/03/24 2155 05/04/24 0616 05/04/24 1220  GLUCAP 175* 210* 275*   Resume SSI. Added novolog  2 units TIDAC. Increase Lantus   to 15 units daily.   UTI: Started him on IV ceftriaxone .  Urine cultures obtained after starting antibiotics.  Complete atleast 3 days of IV ceftriaxone .   Hypothyroidism Resume synthroid .    GERD Continue with PPI.    Full thickness open wound over the upper coccyx Wound care on board and recommendations given.    Anemia from CKD Continue with ESA every 2 weeks.    Stage 4 CKD Creatinine at baseline.  Recommend follow up with Dr Rachele on discharge.     Estimated body mass index is 21.21 kg/m as calculated from the following:   Height as of this encounter: 5' 6 (1.676 m).   Weight as of this encounter: 59.6 kg.  Code Status:full code.  DVT Prophylaxis:  heparin  injection 5,000  Units Start: 05/01/24 2200   Level of Care: Level of care: Telemetry Medical Family Communication: none at bedside.  Disposition Plan:     Remains inpatient appropriate:  pending.   Procedures:  None.   Consultants:   None.   Antimicrobials:   Anti-infectives (From admission, onward)    Start     Dose/Rate Route Frequency Ordered Stop   05/02/24 2000  cefTRIAXone  (ROCEPHIN ) 1 g in sodium chloride  0.9 % 100 mL IVPB        1 g 200 mL/hr over 30 Minutes Intravenous Every 24 hours 05/02/24 1715          Medications  Scheduled Meds:  aspirin  EC  81 mg Oral Daily   buPROPion   150 mg Oral BID   carvedilol   6.25 mg Oral BID WC   heparin  injection (subcutaneous)  5,000 Units Subcutaneous Q8H   hydrocerin   Topical BID   insulin  aspart  0-15 Units Subcutaneous TID WC   insulin  aspart  2 Units Subcutaneous TID WC   insulin  glargine  10 Units Subcutaneous Q2200   levothyroxine   75 mcg Oral  QAC breakfast   metoCLOPramide   5 mg Oral TID AC & HS   pantoprazole   40 mg Oral BID   sodium chloride  flush  3 mL Intravenous Q12H   sodium chloride  flush  3 mL Intravenous Q12H   Continuous Infusions:  cefTRIAXone  (ROCEPHIN )  IV 1 g (05/03/24 2032)   PRN Meds:.acetaminophen  **OR** acetaminophen , sodium chloride  flush, traMADol     Subjective:   Bridget Martinez was seen and examined today.  Encouraged to ambulate and get out of bed.   Objective:   Vitals:   05/03/24 2118 05/04/24 0358 05/04/24 0400 05/04/24 0825  BP: (!) 143/49  (!) 136/42 (!) 135/44  Pulse: 70  66   Resp: 16  18 18   Temp: 97.9 F (36.6 C)  98.2 F (36.8 C) 98.2 F (36.8 C)  TempSrc: Oral  Oral Oral  SpO2: 99%  100% 99%  Weight:  59.6 kg    Height:  5' 6 (1.676 m)      Intake/Output Summary (Last 24 hours) at 05/04/2024 1441 Last data filed at 05/04/2024 1011 Gross per 24 hour  Intake 540 ml  Output 900 ml  Net -360 ml   Filed Weights   05/02/24 0500 05/03/24 0500 05/04/24 0358  Weight: 62.1 kg 62.2 kg 59.6 kg     Exam General exam: Appears calm and comfortable  Respiratory system: Clear to auscultation. Respiratory effort normal. Cardiovascular system: S1 & S2 heard, RRR.  Gastrointestinal system: Abdomen is nondistended, soft and nontender.  Central nervous system: Alert and oriented. . Extremities: no edema.  Skin: coccygeal open wound  Psychiatry:  Mood & affect appropriate.     Data Reviewed:  I have personally reviewed following labs and imaging studies   CBC Lab Results  Component Value Date   WBC 9.5 05/03/2024   RBC 3.74 (L) 05/03/2024   HGB 10.6 (L) 05/03/2024   HCT 32.7 (L) 05/03/2024   MCV 87.4 05/03/2024   MCH 28.3 05/03/2024   PLT 275 05/03/2024   MCHC 32.4 05/03/2024   RDW 19.9 (H) 05/03/2024   LYMPHSABS 1.4 05/03/2024   MONOABS 1.2 (H) 05/03/2024   EOSABS 0.4 05/03/2024   BASOSABS 0.1 05/03/2024     Last metabolic panel Lab Results  Component Value Date    NA 136 05/02/2024   K 4.0 05/02/2024   CL 103 05/02/2024   CO2 20 (L)  05/02/2024   BUN 50 (H) 05/02/2024   CREATININE 1.92 (H) 05/02/2024   GLUCOSE 193 (H) 05/02/2024   GFRNONAA 27 (L) 05/02/2024   GFRAA 51 (L) 08/14/2018   CALCIUM  8.7 (L) 05/02/2024   PHOS 2.3 (L) 03/08/2015   PROT 6.3 (L) 05/02/2024   ALBUMIN  2.9 (L) 05/02/2024   LABGLOB 2.3 11/21/2023   AGRATIO 2.0 06/07/2022   BILITOT 0.6 05/02/2024   ALKPHOS 87 05/02/2024   AST 18 05/02/2024   ALT 18 05/02/2024   ANIONGAP 13 05/02/2024    CBG (last 3)  Recent Labs    05/03/24 2155 05/04/24 0616 05/04/24 1220  GLUCAP 175* 210* 275*      Coagulation Profile: No results for input(s): INR, PROTIME in the last 168 hours.   Radiology Studies: No results found.      Elgie Butter M.D. [facility] 05/04/2024, 2:41 PM  Available via Epic secure chat 7am-7pm After 7 pm, please refer to night coverage provider listed on amion.

## 2024-05-04 NOTE — Progress Notes (Signed)
 SLP Cancellation Note  Patient Details Name: Bridget Martinez MRN: [id number] DOB: [birth year]   Cancelled treatment:       Reason Eval/Treat Not Completed: CTH negative s/p mechanical fall and MD states an evaluation is not necessary at this time. Recommend f/u with SLP at SNF if indicated but will sign off acutely.    Damien Blumenthal, M.A., CCC-SLP Speech Language Pathology, Acute Rehabilitation Services  Secure Chat preferred [phone]  05/04/2024, 1:49 PM

## 2024-05-04 NOTE — TOC Progression Note (Addendum)
 Transition of Care [facility]) - Progression Note    Patient Details  Name: Bridget Martinez MRN: [id number] Date of Birth: August 26, 1951  Transition of Care [facility] CM/SW Contact  Bridget Cordella Simmonds, LCSW Phone Number: 05/04/2024, 1:07 PM  Clinical Narrative:   CSW spoke with pt daughter [name] about SNF bed offers.  Per daughter, pt will be having additional procedure.  Daughter is interested in [facility] for STR once this has been completed.  CSW reached out to [facility] to review.   1345: TC [facility]: she has to check on several medications, will respond later with determination if she can offer bed.   1545: No additional procedure.  CSW spoke with [facility] regarding bed offers and she will accept offer at [facility].  CSW confirmed with [facility].  SNF auth request submitted and approved: [id number], 3 days: 8/27-8/29.     Expected Discharge Plan: Skilled Nursing Facility Barriers to Discharge: Continued Medical Work up, SNF Pending bed offer               Expected Discharge Plan and Services In-house Referral: Clinical Social Work   Post Acute Care Choice: Skilled Nursing Facility Living arrangements for the past 2 months: Single Family Home                                       Social Drivers of Health (SDOH) Interventions SDOH Screenings   Food Insecurity: No Food Insecurity (05/04/2024)  Housing: Low Risk  (05/04/2024)  Transportation Needs: No Transportation Needs (05/04/2024)  Utilities: Not At Risk (05/04/2024)  Alcohol Screen: Low Risk  (04/07/2024)  Depression (PHQ2-9): Low Risk  (04/07/2024)  Financial Resource Strain: Low Risk  (04/07/2024)  Physical Activity: Insufficiently Active (04/07/2024)  Social Connections: Socially Isolated (05/04/2024)  Stress: No Stress Concern Present (04/07/2024)  Tobacco Use: Low Risk  (04/07/2024)  Health Literacy: Adequate Health Literacy (04/07/2024)    Readmission Risk Interventions     No data to display

## 2024-05-04 NOTE — Consult Note (Addendum)
 WOC Nurse Consult Note:  Reason for Consult: sacrum wound Wound type: full-thickness open wound over upper coccyx Pressure Injury POA: NA Measurement: 1x1x1.5 cm depth Wound bed: red friable with some stringy non-viable tissue Drainage red drainage Periwound: macerated and rolled edges; chronic appearing Dressing procedure/placement/frequency: Cleanse with normal saline, pat dry, lightly pack with cut Aquacel #[id number] daily, cover with Mepilex Sacrum foam dressing change every 3 days or PRN  Eucerin cream to arms and legs BID  Placed skin order set regarding high risk for PI development   Buttock; Coccyx 05/04/24    WOC will not follow and will remove patient from census task list. Please reconsult if wound worsens in condition and notify provider.   Sherrilyn Hals MSN RN CWOCN WOC [facility]  [phone] (Available from 7-3 pm Mon-Friday)

## 2024-05-05 ENCOUNTER — Encounter: Payer: Self-pay | Admitting: *Deleted

## 2024-05-05 DIAGNOSIS — E1065 Type 1 diabetes mellitus with hyperglycemia: Secondary | ICD-10-CM | POA: Diagnosis not present

## 2024-05-05 DIAGNOSIS — I509 Heart failure, unspecified: Secondary | ICD-10-CM | POA: Diagnosis not present

## 2024-05-05 DIAGNOSIS — E038 Other specified hypothyroidism: Secondary | ICD-10-CM | POA: Diagnosis not present

## 2024-05-05 DIAGNOSIS — R52 Pain, unspecified: Secondary | ICD-10-CM | POA: Diagnosis not present

## 2024-05-05 DIAGNOSIS — Z7901 Long term (current) use of anticoagulants: Secondary | ICD-10-CM | POA: Diagnosis not present

## 2024-05-05 DIAGNOSIS — N1832 Chronic kidney disease, stage 3b: Secondary | ICD-10-CM | POA: Diagnosis not present

## 2024-05-05 DIAGNOSIS — R41841 Cognitive communication deficit: Secondary | ICD-10-CM | POA: Diagnosis not present

## 2024-05-05 DIAGNOSIS — E1042 Type 1 diabetes mellitus with diabetic polyneuropathy: Secondary | ICD-10-CM | POA: Diagnosis not present

## 2024-05-05 DIAGNOSIS — S329XXA Fracture of unspecified parts of lumbosacral spine and pelvis, initial encounter for closed fracture: Secondary | ICD-10-CM | POA: Diagnosis not present

## 2024-05-05 DIAGNOSIS — R1310 Dysphagia, unspecified: Secondary | ICD-10-CM | POA: Diagnosis not present

## 2024-05-05 DIAGNOSIS — N179 Acute kidney failure, unspecified: Secondary | ICD-10-CM | POA: Diagnosis not present

## 2024-05-05 DIAGNOSIS — E119 Type 2 diabetes mellitus without complications: Secondary | ICD-10-CM | POA: Diagnosis not present

## 2024-05-05 DIAGNOSIS — F418 Other specified anxiety disorders: Secondary | ICD-10-CM | POA: Diagnosis not present

## 2024-05-05 DIAGNOSIS — E1122 Type 2 diabetes mellitus with diabetic chronic kidney disease: Secondary | ICD-10-CM | POA: Diagnosis not present

## 2024-05-05 DIAGNOSIS — N184 Chronic kidney disease, stage 4 (severe): Secondary | ICD-10-CM | POA: Diagnosis not present

## 2024-05-05 DIAGNOSIS — E162 Hypoglycemia, unspecified: Secondary | ICD-10-CM | POA: Diagnosis not present

## 2024-05-05 DIAGNOSIS — K219 Gastro-esophageal reflux disease without esophagitis: Secondary | ICD-10-CM | POA: Diagnosis not present

## 2024-05-05 DIAGNOSIS — M6281 Muscle weakness (generalized): Secondary | ICD-10-CM | POA: Diagnosis not present

## 2024-05-05 DIAGNOSIS — E1165 Type 2 diabetes mellitus with hyperglycemia: Secondary | ICD-10-CM | POA: Diagnosis not present

## 2024-05-05 DIAGNOSIS — R634 Abnormal weight loss: Secondary | ICD-10-CM | POA: Diagnosis not present

## 2024-05-05 DIAGNOSIS — I959 Hypotension, unspecified: Secondary | ICD-10-CM | POA: Diagnosis not present

## 2024-05-05 DIAGNOSIS — E785 Hyperlipidemia, unspecified: Secondary | ICD-10-CM | POA: Diagnosis not present

## 2024-05-05 DIAGNOSIS — Z710 Person encountering health services to consult on behalf of another person: Secondary | ICD-10-CM | POA: Diagnosis not present

## 2024-05-05 DIAGNOSIS — Z9181 History of falling: Secondary | ICD-10-CM | POA: Diagnosis not present

## 2024-05-05 DIAGNOSIS — N390 Urinary tract infection, site not specified: Secondary | ICD-10-CM | POA: Diagnosis not present

## 2024-05-05 DIAGNOSIS — L89153 Pressure ulcer of sacral region, stage 3: Secondary | ICD-10-CM | POA: Diagnosis not present

## 2024-05-05 DIAGNOSIS — R110 Nausea: Secondary | ICD-10-CM | POA: Diagnosis not present

## 2024-05-05 DIAGNOSIS — Z794 Long term (current) use of insulin: Secondary | ICD-10-CM | POA: Diagnosis not present

## 2024-05-05 DIAGNOSIS — S22059D Unspecified fracture of T5-T6 vertebra, subsequent encounter for fracture with routine healing: Secondary | ICD-10-CM | POA: Diagnosis not present

## 2024-05-05 DIAGNOSIS — E039 Hypothyroidism, unspecified: Secondary | ICD-10-CM | POA: Diagnosis not present

## 2024-05-05 DIAGNOSIS — S2239XA Fracture of one rib, unspecified side, initial encounter for closed fracture: Secondary | ICD-10-CM | POA: Diagnosis not present

## 2024-05-05 DIAGNOSIS — Z79899 Other long term (current) drug therapy: Secondary | ICD-10-CM | POA: Diagnosis not present

## 2024-05-05 DIAGNOSIS — I2510 Atherosclerotic heart disease of native coronary artery without angina pectoris: Secondary | ICD-10-CM | POA: Diagnosis not present

## 2024-05-05 DIAGNOSIS — I10 Essential (primary) hypertension: Secondary | ICD-10-CM | POA: Diagnosis not present

## 2024-05-05 DIAGNOSIS — E875 Hyperkalemia: Secondary | ICD-10-CM | POA: Diagnosis not present

## 2024-05-05 DIAGNOSIS — R262 Difficulty in walking, not elsewhere classified: Secondary | ICD-10-CM | POA: Diagnosis not present

## 2024-05-05 DIAGNOSIS — R5381 Other malaise: Secondary | ICD-10-CM | POA: Diagnosis not present

## 2024-05-05 DIAGNOSIS — S32592D Other specified fracture of left pubis, subsequent encounter for fracture with routine healing: Secondary | ICD-10-CM | POA: Diagnosis not present

## 2024-05-05 DIAGNOSIS — R2689 Other abnormalities of gait and mobility: Secondary | ICD-10-CM | POA: Diagnosis not present

## 2024-05-05 DIAGNOSIS — R531 Weakness: Secondary | ICD-10-CM | POA: Diagnosis not present

## 2024-05-05 DIAGNOSIS — W19XXXA Unspecified fall, initial encounter: Secondary | ICD-10-CM | POA: Diagnosis not present

## 2024-05-05 DIAGNOSIS — R2681 Unsteadiness on feet: Secondary | ICD-10-CM | POA: Diagnosis not present

## 2024-05-05 DIAGNOSIS — S2232XD Fracture of one rib, left side, subsequent encounter for fracture with routine healing: Secondary | ICD-10-CM | POA: Diagnosis not present

## 2024-05-05 DIAGNOSIS — S32592A Other specified fracture of left pubis, initial encounter for closed fracture: Secondary | ICD-10-CM | POA: Diagnosis not present

## 2024-05-05 DIAGNOSIS — R739 Hyperglycemia, unspecified: Secondary | ICD-10-CM | POA: Diagnosis not present

## 2024-05-05 DIAGNOSIS — Z743 Need for continuous supervision: Secondary | ICD-10-CM | POA: Diagnosis not present

## 2024-05-05 DIAGNOSIS — Y92009 Unspecified place in unspecified non-institutional (private) residence as the place of occurrence of the external cause: Secondary | ICD-10-CM | POA: Diagnosis not present

## 2024-05-05 DIAGNOSIS — D631 Anemia in chronic kidney disease: Secondary | ICD-10-CM | POA: Diagnosis not present

## 2024-05-05 LAB — GLUCOSE, CAPILLARY: Glucose-Capillary: 187 mg/dL — ABNORMAL HIGH (ref 70–99)

## 2024-05-05 MED ORDER — TRAMADOL HCL 50 MG PO TABS: 50.0000 mg | ORAL_TABLET | Freq: Four times a day (QID) | ORAL | 0 refills | Status: DC | PRN

## 2024-05-05 MED ORDER — LOPERAMIDE HCL 2 MG PO CAPS
2.0000 mg | ORAL_CAPSULE | ORAL | Status: DC | PRN
  Administered 2024-05-05: 2 mg via ORAL
  Filled 2024-05-05: qty 1

## 2024-05-05 NOTE — Plan of Care (Signed)

## 2024-05-05 NOTE — Progress Notes (Signed)
 AVS in packet for transport   Tramadol  paper script in packet

## 2024-05-05 NOTE — Discharge Summary (Signed)
 Physician Discharge Summary  Bridget Martinez FMW:[id number] DOB: 03/14/1951 DOA: 05/01/2024  PCP: Severa [name], FNP  Admit date: 05/01/2024 Discharge date: 05/05/2024  Admitted From: home Disposition:  SNF  Recommendations for Outpatient Follow-up:  Follow up with PCP in 1-2 weeks Please obtain BMP/CBC in one week  Home Health: none Equipment/Devices: none  Discharge Condition: stable CODE STATUS: Full code Diet Orders (From admission, onward)     Start     Ordered   05/01/24 2103  Diet Carb Modified Fluid consistency: Thin; Room service appropriate? Yes  Diet effective now       Question Answer Comment  Diet-HS Snack? Nothing   Calorie Level Medium 1600-2000   Fluid consistency: Thin   Room service appropriate? Yes      05/01/24 2102            HPI: Per admitting MD, Bridget Martinez is a 73 y.o. female with past medical history  of anemia being followed by cancer center at [facility] patient received a Retacrit  injection on 19 April 2024, essential hypertension, diabetes mellitus, acquired hypothyroidism being admitted today for a fall and pain.  Per report patient had a mechanical fall where she on Monday, 18 August and fell and slipped on a wet floor on her bottom, no reports of loss of consciousness and started having pain yesterday more so on her lower back with difficulty walking and unable to bear weight because of the pain patient at baseline uses a walker.  No reports of numbness tingling paresthesia or incontinence or bandlike pain.  No reports of weakness.  Of note patient also has past medical history of allergies to penicillin, sulfa, ciprofloxacin , Macrobid .  Admission requested for pain control and physical therapy.   Hospital Course / Discharge diagnoses: Principal problem Left inferior pubic ramus fracture -orthopedic surgery consulted, evaluated, recommending conservative management.  Weightbearing as tolerated, she worked with physical therapy and SNF  was recommended.  She will be discharged in stable condition, continue pain control and ongoing therapy  Active problems Acute displaced fracture of the posterior left 10th rib -noted, conservative management, incentive spirometry Age indeterminate T5 inferior endplate compression fracture -evaluated by IR, no role for kyphoplasty Hypertension-continue home medications UTI -completed ceftriaxone  while hospitalized Hypothyroidism - resume synthroid .  GERD - Continue with PPI.  Full thickness open wound over the upper coccyx -continue local wound care  Anemia from CKD - Continue with ESA every 2 weeks.  Stage 4 CKD - Creatinine at baseline. Recommend follow up with Dr Rachele on discharge.   Sepsis ruled out   Discharge Instructions   Allergies as of 05/05/2024       Reactions   Penicillins Anaphylaxis, Hives   Tolerates zosyn  Has patient had a PCN reaction causing immediate rash, facial/tongue/throat swelling, SOB or lightheadedness with hypotension: Yes Has patient had a PCN reaction causing severe rash involving mucus membranes or skin necrosis: No Has patient had a PCN reaction that required hospitalization: Yes Has patient had a PCN reaction occurring within the last 10 years: No If all of the above answers are NO, then may proceed with Cephalosporin use.   Sulfa Antibiotics Anaphylaxis, Other (See Comments), Swelling   Stiff neck also   Ciprofloxacin  Other (See Comments)   Macrobid  [nitrofurantoin  Monohyd Macro] Other (See Comments)   Skin peels off   Other Other (See Comments)   Ciprofloxacin  Hcl Other (See Comments)   Skin peeling        Medication List  TAKE these medications    aspirin  EC 81 MG tablet Take 1 tablet (81 mg total) by mouth daily.   buPROPion  150 MG 24 hr tablet Commonly known as: WELLBUTRIN  XL Take 1 tablet (150 mg total) by mouth 2 (two) times daily.   carvedilol  12.5 MG tablet Commonly known as: COREG  TAKE 1 TABLET (12.5MG  TOTAL) BY  MOUTH TWICE A DAY WITH MEALS   Dexcom G6 Receiver Devi See admin instructions.   Dexcom G6 Sensor Misc See admin instructions.   epoetin  alfa 3000 UNIT/ML injection Commonly known as: EPOGEN  Inject into the skin.   ferrous sulfate  325 (65 FE) MG EC tablet Take 1 tablet (325 mg total) by mouth every other day.   furosemide  20 MG tablet Commonly known as: LASIX  Take 20 mg by mouth once a week.   Lantus  SoloStar 100 UNIT/ML Solostar Pen Generic drug: insulin  glargine Inject 12 Units into the skin at bedtime.   levothyroxine  75 MCG tablet Commonly known as: SYNTHROID  Take 75 mcg by mouth daily before breakfast.   metoCLOPramide  5 MG tablet Commonly known as: REGLAN  TAKE 1 TABLET BY MOUTH 3 TIMES DAILY BEFORE MEALS. What changed: See the new instructions.   mirtazapine  15 MG tablet Commonly known as: REMERON  Take 1 tablet (15 mg total) by mouth at bedtime.   NovoLOG  FlexPen 100 UNIT/ML FlexPen Generic drug: insulin  aspart Inject 9-16 Units into the skin 3 (three) times daily with meals. Take 9 units at breakfast, 16 units at lunch, 14 units at bedtime   ondansetron  4 MG tablet Commonly known as: ZOFRAN  Take 4 mg by mouth every 8 (eight) hours as needed.   pantoprazole  40 MG tablet Commonly known as: PROTONIX  Take 1 tablet (40 mg total) by mouth daily.   potassium chloride  20 MEQ packet Commonly known as: KLOR-CON  Take 20 mEq by mouth 2 (two) times daily.   rosuvastatin  40 MG tablet Commonly known as: CRESTOR  Take 1 tablet (40 mg total) by mouth daily.   sodium bicarbonate  650 MG tablet Take 650 mg by mouth 3 (three) times daily.   traMADol  50 MG tablet Commonly known as: ULTRAM  Take 1 tablet (50 mg total) by mouth every 6 (six) hours as needed for moderate pain (pain score 4-6).        Contact information for follow-up providers     [facility] IR Imaging Follow up.   Specialty: Radiology Why: Please reach out to [facility] (Interventional  Radiology) if back pain poorly controlled or worsens. Contact information: [address] [place]  [address] [phone]        Germaine Redbird, MD. Schedule an appointment as soon as possible for a visit in 3 week(s).   Specialty: Sports Medicine Why: for the pelvic fracture Contact information: [address] [place] [address] [phone]         Severa [name], FNP. Schedule an appointment as soon as possible for a visit in 1 week(s).   Specialty: Family Medicine Contact information: [address] [place] [address] [phone]              Contact information for after-discharge care     Destination     [facility] .   Service: Skilled Nursing Contact information: [address] [place]  [address] [phone]                    Consultations: Orthopedic surgery   Procedures/Studies:  CT  HEAD WO CONTRAST ( ) Result Date: 05/01/2024 CLINICAL DATA:  Status post fall. EXAM: CT HEAD WITHOUT CONTRAST TECHNIQUE: Contiguous axial images were obtained from the base of the skull through the vertex without intravenous contrast. RADIATION DOSE REDUCTION: This exam was performed according to the departmental dose-optimization program which includes automated exposure control, adjustment of the mA and/or kV according to patient size and/or use of iterative reconstruction technique. COMPARISON:  November 19, 2014 FINDINGS: Brain: There is generalized cerebral atrophy with widening of the extra-axial spaces and ventricular dilatation. There are areas of decreased attenuation within the white matter tracts of the supratentorial brain, consistent with microvascular disease changes. Vascular: Marked severity bilateral cavernous carotid artery calcification is noted. Skull: Normal. Negative for fracture or focal lesion. Sinuses/Orbits: No acute finding. Other: None. IMPRESSION: 1.  Generalized cerebral atrophy and microvascular disease changes of the supratentorial brain. 2. No acute intracranial abnormality. Electronically Signed   By: Suzen Dials M.D.   On: 05/01/2024 19:06   CT Lumbar Spine Wo Contrast Result Date: 05/01/2024 CLINICAL DATA:  Low back pain, increased fracture risk. Low back pain, trauma. EXAM: CT THORACIC AND LUMBAR SPINE WITHOUT CONTRAST TECHNIQUE: Multidetector CT imaging of the thoracic and lumbar spine was performed without contrast. Multiplanar CT image reconstructions were also generated. RADIATION DOSE REDUCTION: This exam was performed according to the departmental dose-optimization program which includes automated exposure control, adjustment of the mA and/or kV according to patient size and/or use of iterative reconstruction technique. COMPARISON:  CT abdomen/pelvis 07/30/2014. FINDINGS: CT THORACIC SPINE FINDINGS Alignment: Dextrocurvature of the thoracic spine. Trace bony retropulsion at the level of the T5 inferior endplate. Vertebrae: Age-indeterminate T5 inferior endplate vertebral compression fracture (with 50-60% vertebral body height loss). Trace bony retropulsion at the level of the T5 inferior endplate. No other thoracic spine fracture is identified. Paraspinal and other soft tissues: Prior median sternotomy. Cardiomegaly. Low-density cardiac blood pool suggesting anemia. Coronary artery atherosclerosis. Aortic atherosclerosis. No paraspinal mass or collection. Disc levels: Multilevel disc space narrowing, greatest at T5-T6, T9-T10 and T10-T11 (moderate at these levels). No spinal canal stenosis is appreciated. No high-grade bony neural foraminal narrowing. Other: Acute, mildly displaced fracture of the posterior left tenth rib. CT LUMBAR SPINE FINDINGS Segmentation: 5 lumbar vertebrae. The caudal most well-formed intervertebral disc space is designated L5-S1. Alignment: Levocurvature of the lumbar spine. Slight L1-L2 grade 1 retrolisthesis. 3 mm  bony retropulsion at the level of the L2 inferior endplate. 2 mm L5-S1 grade 1 anterolisthesis. Vertebrae: Chronic L2 inferior endplate vertebral compression fracture (with up to 50% vertebral body height loss), unchanged from the prior CT abdomen/pelvis of 07/30/2014. No other lumbar spine fracture is identified. Paraspinal and other soft tissues: No acute finding within included portions of the abdomen/retroperitoneum. No paraspinal mass or collection. Disc levels: Disc space narrowing is greatest at L2-L3 (moderate at this level). T12-L1: No significant spinal canal or foraminal stenosis. L1-L2: Slight grade 1 retrolisthesis. No significant spinal canal or foraminal stenosis. L2-L3: Mild bony retropulsion at the level of the L2 inferior endplate. Left foraminal disc protrusion. No significant spinal canal stenosis. Mild-to-moderate left neural foraminal narrowing. L3-L4: Disc bulge. Ligamentum flavum hypertrophy. No significant spinal canal stenosis. Mild bilateral neural foraminal narrowing. L4-L5: Disc bulge. Superimposed broad-based right subarticular/foraminal disc protrusion. Ligamentum flavum hypertrophy. Right subarticular stenosis. Moderate central canal stenosis. Moderate severe right neural foraminal narrowing. Mild left neural foraminal narrowing. L5-S1: Mild grade 1 anterolisthesis. Disc bulge. No significant spinal canal or foraminal stenosis. Other: Displaced fracture of the left  inferior pubic ramus, new as compared to the CT abdomen/pelvis of 07/30/2014 but otherwise age-indeterminate. Partially imaged left hip prosthesis. IMPRESSION: Thoracic spine: 1. Age-indeterminate T5 inferior endplate compression fracture (50-60% vertebral body height loss). 2. Dextrocurvature of the thoracic spine. 3. Thoracic spondylosis as described. 4. Acute, displaced fracture of the posterior left tenth rib. 5. Cardiomegaly. 6. Low-density cardiac blood pool suggesting anemia. 7. Coronary artery atherosclerosis. 8.  Aortic Atherosclerosis (ICD10-I70.0). Lumbar spine: 1. Chronic L2 inferior endplate vertebral compression fracture with unchanged height loss as compared to the prior CT abdomen/pelvis of 07/30/2014 (up to 50%). Mild bony retropulsion at the level of the L2 inferior endplate. 2. Displaced fracture of the left inferior pubic ramus, new from the prior CT abdomen/pelvis of 07/30/2014 but otherwise age-indeterminate. 3. Lumbar spondylosis as described. 4. Levocurvature of the lumbar spine. 5. Mild multilevel grade 1 spondylolisthesis. Electronically Signed   By: Rockey Childs D.O.   On: 05/01/2024 12:59   CT Thoracic Spine Wo Contrast Result Date: 05/01/2024 CLINICAL DATA:  Low back pain, increased fracture risk. Low back pain, trauma. EXAM: CT THORACIC AND LUMBAR SPINE WITHOUT CONTRAST TECHNIQUE: Multidetector CT imaging of the thoracic and lumbar spine was performed without contrast. Multiplanar CT image reconstructions were also generated. RADIATION DOSE REDUCTION: This exam was performed according to the departmental dose-optimization program which includes automated exposure control, adjustment of the mA and/or kV according to patient size and/or use of iterative reconstruction technique. COMPARISON:  CT abdomen/pelvis 07/30/2014. FINDINGS: CT THORACIC SPINE FINDINGS Alignment: Dextrocurvature of the thoracic spine. Trace bony retropulsion at the level of the T5 inferior endplate. Vertebrae: Age-indeterminate T5 inferior endplate vertebral compression fracture (with 50-60% vertebral body height loss). Trace bony retropulsion at the level of the T5 inferior endplate. No other thoracic spine fracture is identified. Paraspinal and other soft tissues: Prior median sternotomy. Cardiomegaly. Low-density cardiac blood pool suggesting anemia. Coronary artery atherosclerosis. Aortic atherosclerosis. No paraspinal mass or collection. Disc levels: Multilevel disc space narrowing, greatest at T5-T6, T9-T10 and T10-T11  (moderate at these levels). No spinal canal stenosis is appreciated. No high-grade bony neural foraminal narrowing. Other: Acute, mildly displaced fracture of the posterior left tenth rib. CT LUMBAR SPINE FINDINGS Segmentation: 5 lumbar vertebrae. The caudal most well-formed intervertebral disc space is designated L5-S1. Alignment: Levocurvature of the lumbar spine. Slight L1-L2 grade 1 retrolisthesis. 3 mm bony retropulsion at the level of the L2 inferior endplate. 2 mm L5-S1 grade 1 anterolisthesis. Vertebrae: Chronic L2 inferior endplate vertebral compression fracture (with up to 50% vertebral body height loss), unchanged from the prior CT abdomen/pelvis of 07/30/2014. No other lumbar spine fracture is identified. Paraspinal and other soft tissues: No acute finding within included portions of the abdomen/retroperitoneum. No paraspinal mass or collection. Disc levels: Disc space narrowing is greatest at L2-L3 (moderate at this level). T12-L1: No significant spinal canal or foraminal stenosis. L1-L2: Slight grade 1 retrolisthesis. No significant spinal canal or foraminal stenosis. L2-L3: Mild bony retropulsion at the level of the L2 inferior endplate. Left foraminal disc protrusion. No significant spinal canal stenosis. Mild-to-moderate left neural foraminal narrowing. L3-L4: Disc bulge. Ligamentum flavum hypertrophy. No significant spinal canal stenosis. Mild bilateral neural foraminal narrowing. L4-L5: Disc bulge. Superimposed broad-based right subarticular/foraminal disc protrusion. Ligamentum flavum hypertrophy. Right subarticular stenosis. Moderate central canal stenosis. Moderate severe right neural foraminal narrowing. Mild left neural foraminal narrowing. L5-S1: Mild grade 1 anterolisthesis. Disc bulge. No significant spinal canal or foraminal stenosis. Other: Displaced fracture of the left inferior pubic ramus, new as compared  to the CT abdomen/pelvis of 07/30/2014 but otherwise age-indeterminate.  Partially imaged left hip prosthesis. IMPRESSION: Thoracic spine: 1. Age-indeterminate T5 inferior endplate compression fracture (50-60% vertebral body height loss). 2. Dextrocurvature of the thoracic spine. 3. Thoracic spondylosis as described. 4. Acute, displaced fracture of the posterior left tenth rib. 5. Cardiomegaly. 6. Low-density cardiac blood pool suggesting anemia. 7. Coronary artery atherosclerosis. 8. Aortic Atherosclerosis (ICD10-I70.0). Lumbar spine: 1. Chronic L2 inferior endplate vertebral compression fracture with unchanged height loss as compared to the prior CT abdomen/pelvis of 07/30/2014 (up to 50%). Mild bony retropulsion at the level of the L2 inferior endplate. 2. Displaced fracture of the left inferior pubic ramus, new from the prior CT abdomen/pelvis of 07/30/2014 but otherwise age-indeterminate. 3. Lumbar spondylosis as described. 4. Levocurvature of the lumbar spine. 5. Mild multilevel grade 1 spondylolisthesis. Electronically Signed   By: Rockey Childs D.O.   On: 05/01/2024 12:59   DG Lumbar Spine Complete Result Date: 05/01/2024 CLINICAL DATA:  Fall.  Pain. EXAM: LUMBAR SPINE - COMPLETE 4+ VIEW COMPARISON:  None. FINDINGS: Bones are diffusely demineralized. Mild convex rightward lumbar scoliosis evident. The bony demineralization and substantial overlying bowel gas limits assessment. Loss of disc height noted at L3-4. Superior endplate deformity at L3 appears chronic and may be a Schmorl's node. Atherosclerotic calcification is noted in the abdominal aorta. IMPRESSION: No definite acute lumbar spine fracture by x-ray. See report for dedicated lumbar spine CT dictated separately. Electronically Signed   By: Camellia Candle M.D.   On: 05/01/2024 12:48     Subjective: - no chest pain, shortness of breath, no abdominal pain, nausea or vomiting.   Discharge Exam: BP (!) 146/47 (BP Location: Left Arm)   Pulse 66   Temp 98.2 F (36.8 C)   Resp 16   Ht 5' 6 (1.676 m)   Wt 60.3 kg    SpO2 100%   BMI 21.46 kg/m   General: Pt is alert, awake, not in acute distress Cardiovascular: RRR, S1/S2 +, no rubs, no gallops Respiratory: CTA bilaterally, no wheezing, no rhonchi Abdominal: Soft, NT, ND, bowel sounds + Extremities: no edema, no cyanosis    The results of significant diagnostics from this hospitalization (including imaging, microbiology, ancillary and laboratory) are listed below for reference.     Microbiology: Recent Results (from the past 240 hours)  Urine Culture     Status: Abnormal   Collection Time: 05/03/24  9:29 AM   Specimen: Urine, Clean Catch  Result Value Ref Range Status   Specimen Description URINE, CLEAN CATCH  Final   Special Requests NONE  Final   Culture (A)  Final    <10,000 COLONIES/mL INSIGNIFICANT GROWTH Performed at [facility], [address], [place], [place] [address]    Report Status 05/04/2024 FINAL  Final     Labs: Basic Metabolic Panel: Recent Labs  Lab 05/01/24 1227 05/01/24 1744 05/02/24 1212  NA 138  --  136  K 3.7  --  4.0  CL 104  --  103  CO2 23  --  20*  GLUCOSE 189*  --  193*  BUN 51*  --  50*  CREATININE 2.19*  --  1.92*  CALCIUM  8.9  --  8.7*  MG  --  1.9  --    Liver Function Tests: Recent Labs  Lab 05/01/24 2253 05/02/24 1212  AST 18 18  ALT 18 18  ALKPHOS 84 87  BILITOT 1.0 0.6  PROT 6.4* 6.3*  ALBUMIN  3.0* 2.9*  CBC: Recent Labs  Lab 05/01/24 1227 05/02/24 1212 05/03/24 2010  WBC 11.3* 10.6* 9.5  NEUTROABS 8.9*  --  6.5  HGB 9.8* 10.3* 10.6*  HCT 30.7* 31.7* 32.7*  MCV 89.5 87.8 87.4  PLT 212 227 275   CBG: Recent Labs  Lab 05/04/24 0616 05/04/24 1220 05/04/24 1744 05/04/24 2042 05/05/24 0642  GLUCAP 210* 275* 220* 220* 187*   Hgb A1c No results for input(s): HGBA1C in the last 72 hours. Lipid Profile No results for input(s): CHOL, HDL, LDLCALC, TRIG, CHOLHDL, LDLDIRECT in the last 72 hours. Thyroid  function studies No results for input(s):  TSH, T4TOTAL, T3FREE, THYROIDAB in the last 72 hours.  Invalid input(s): FREET3 Urinalysis    Component Value Date/Time   COLORURINE YELLOW 05/02/2024 0937   APPEARANCEUR TURBID (A) 05/02/2024 0937   APPEARANCEUR Hazy (A) 07/29/2017 0950   LABSPEC 1.020 05/02/2024 0937   PHURINE 7.0 05/02/2024 0937   GLUCOSEU NEGATIVE 05/02/2024 0937   HGBUR SMALL (A) 05/02/2024 0937   BILIRUBINUR NEGATIVE 05/02/2024 0937   BILIRUBINUR Negative 07/29/2017 0950   KETONESUR 5 (A) 05/02/2024 0937   PROTEINUR 100 (A) 05/02/2024 0937   UROBILINOGEN negative 10/03/2015 1154   UROBILINOGEN 0.2 03/06/2015 2158   NITRITE NEGATIVE 05/02/2024 0937   LEUKOCYTESUR NEGATIVE 05/02/2024 9062    FURTHER DISCHARGE INSTRUCTIONS:   Get Medicines reviewed and adjusted: Please take all your medications with you for your next visit with your Primary MD   Laboratory/radiological data: Please request your Primary MD to go over all hospital tests and procedure/radiological results at the follow up, please ask your Primary MD to get all Hospital records sent to his/her office.   In some cases, they will be blood work, cultures and biopsy results pending at the time of your discharge. Please request that your primary care M.D. goes through all the records of your hospital data and follows up on these results.   Also Note the following: If you experience worsening of your admission symptoms, develop shortness of breath, life threatening emergency, suicidal or homicidal thoughts you must seek medical attention immediately by calling 911 or calling your MD immediately  if symptoms less severe.   You must read complete instructions/literature along with all the possible adverse reactions/side effects for all the Medicines you take and that have been prescribed to you. Take any new Medicines after you have completely understood and accpet all the possible adverse reactions/side effects.    Do not drive when taking  Pain medications or sleeping medications (Benzodaizepines)   Do not take more than prescribed Pain, Sleep and Anxiety Medications. It is not advisable to combine anxiety,sleep and pain medications without talking with your primary care practitioner   Special Instructions: If you have smoked or chewed Tobacco  in the last 2 yrs please stop smoking, stop any regular Alcohol  and or any Recreational drug use.   Wear Seat belts while driving.   Please note: You were cared for by a hospitalist during your hospital stay. Once you are discharged, your primary care physician will handle any further medical issues. Please note that NO REFILLS for any discharge medications will be authorized once you are discharged, as it is imperative that you return to your primary care physician (or establish a relationship with a primary care physician if you do not have one) for your post hospital discharge needs so that they can reassess your need for medications and monitor your lab values.  Time coordinating discharge: 40 minutes  SIGNED:  Nilda Fendt, MD, PhD 05/05/2024, 8:52 AM

## 2024-05-05 NOTE — Progress Notes (Signed)
 Cathy @ [facility] was contacted by telephone to verify understanding of discharge instructions status post their most recent discharge from the hospital on the date:  05/05/24.  Inpatient discharge AVS was re-reviewed with patient, along with cancer center appointments.  Verification of understanding for oncology specific follow-up was validated using the Teach Back method.    Transportation to appointments were confirmed for the patient as being Per [employer].

## 2024-05-05 NOTE — TOC Transition Note (Addendum)
 Transition of Care [facility]) - Discharge Note   Patient Details  Name: Bridget Martinez MRN: [id number] Date of Birth: 27-Jun-1951  Transition of Care [facility] CM/SW Contact:  Bridget Cordella Simmonds, LCSW Phone Number: 05/05/2024, 10:44 AM   Clinical Narrative:   Pt discharging to [facility].  RN call report to [phone].  PTAR called [phone].   0930: CSW confirmed with [facility] that they can receive pt today.  1050: TC daughter [name]: she is upset she was not called earlier, states she cannot get here in time to bring pt clothes.  She then hung up on CSW  Final next level of care: Skilled Nursing Facility Barriers to Discharge: Barriers Resolved   Patient Goals and CMS Choice Patient states their goals for this hospitalization and ongoing recovery are:: walk again          Discharge Placement              Patient chooses bed at: [facility] Patient to be transferred to facility by: ptar Name of family member notified: left message daughter erica Patient and family notified of of transfer: 05/05/24  Discharge Plan and Services Additional resources added to the After Visit Summary for   In-house Referral: Clinical Social Work   Post Acute Care Choice: Skilled Nursing Facility                               Social Drivers of Health (SDOH) Interventions SDOH Screenings   Food Insecurity: No Food Insecurity (05/04/2024)  Housing: Low Risk  (05/04/2024)  Transportation Needs: No Transportation Needs (05/04/2024)  Utilities: Not At Risk (05/04/2024)  Alcohol Screen: Low Risk  (04/07/2024)  Depression (PHQ2-9): Low Risk  (04/07/2024)  Financial Resource Strain: Low Risk  (04/07/2024)  Physical Activity: Insufficiently Active (04/07/2024)  Social Connections: Socially Isolated (05/04/2024)  Stress: No Stress Concern Present (04/07/2024)  Tobacco Use: Low Risk  (04/07/2024)  Health Literacy: Adequate Health Literacy (04/07/2024)     Readmission Risk  Interventions     No data to display

## 2024-05-05 NOTE — Progress Notes (Signed)
 Report called to Amy at [facility].

## 2024-05-06 DIAGNOSIS — E119 Type 2 diabetes mellitus without complications: Secondary | ICD-10-CM | POA: Diagnosis not present

## 2024-05-06 DIAGNOSIS — S329XXA Fracture of unspecified parts of lumbosacral spine and pelvis, initial encounter for closed fracture: Secondary | ICD-10-CM | POA: Diagnosis not present

## 2024-05-06 DIAGNOSIS — S2239XA Fracture of one rib, unspecified side, initial encounter for closed fracture: Secondary | ICD-10-CM | POA: Diagnosis not present

## 2024-05-06 DIAGNOSIS — R5381 Other malaise: Secondary | ICD-10-CM | POA: Diagnosis not present

## 2024-05-06 DIAGNOSIS — I10 Essential (primary) hypertension: Secondary | ICD-10-CM | POA: Diagnosis not present

## 2024-05-06 DIAGNOSIS — E038 Other specified hypothyroidism: Secondary | ICD-10-CM | POA: Diagnosis not present

## 2024-05-07 DIAGNOSIS — E038 Other specified hypothyroidism: Secondary | ICD-10-CM | POA: Diagnosis not present

## 2024-05-07 DIAGNOSIS — E785 Hyperlipidemia, unspecified: Secondary | ICD-10-CM | POA: Diagnosis not present

## 2024-05-07 DIAGNOSIS — F418 Other specified anxiety disorders: Secondary | ICD-10-CM | POA: Diagnosis not present

## 2024-05-07 DIAGNOSIS — S2239XA Fracture of one rib, unspecified side, initial encounter for closed fracture: Secondary | ICD-10-CM | POA: Diagnosis not present

## 2024-05-07 DIAGNOSIS — I2510 Atherosclerotic heart disease of native coronary artery without angina pectoris: Secondary | ICD-10-CM | POA: Diagnosis not present

## 2024-05-07 DIAGNOSIS — E119 Type 2 diabetes mellitus without complications: Secondary | ICD-10-CM | POA: Diagnosis not present

## 2024-05-07 DIAGNOSIS — I509 Heart failure, unspecified: Secondary | ICD-10-CM | POA: Diagnosis not present

## 2024-05-07 DIAGNOSIS — I10 Essential (primary) hypertension: Secondary | ICD-10-CM | POA: Diagnosis not present

## 2024-05-07 DIAGNOSIS — S329XXA Fracture of unspecified parts of lumbosacral spine and pelvis, initial encounter for closed fracture: Secondary | ICD-10-CM | POA: Diagnosis not present

## 2024-05-10 DIAGNOSIS — E119 Type 2 diabetes mellitus without complications: Secondary | ICD-10-CM | POA: Diagnosis not present

## 2024-05-11 DIAGNOSIS — L89153 Pressure ulcer of sacral region, stage 3: Secondary | ICD-10-CM | POA: Diagnosis not present

## 2024-05-12 ENCOUNTER — Telehealth: Payer: Self-pay

## 2024-05-12 DIAGNOSIS — R52 Pain, unspecified: Secondary | ICD-10-CM | POA: Diagnosis not present

## 2024-05-12 DIAGNOSIS — Z79899 Other long term (current) drug therapy: Secondary | ICD-10-CM | POA: Diagnosis not present

## 2024-05-12 NOTE — Telephone Encounter (Signed)
 Copied from CRM [id number]. Topic: Clinical - Medication Question >> May 12, 2024  3:31 PM Delon DASEN wrote: Reason for CRM: Daughter [name] needs current medication list for nursing home- [phone] can text if that is possible

## 2024-05-12 NOTE — Telephone Encounter (Signed)
 Called daughter back to let her know she is NOT on DPR.  No information can be given.   NA  When daughter calls back let her know she is not on DPR.

## 2024-05-13 ENCOUNTER — Inpatient Hospital Stay

## 2024-05-14 ENCOUNTER — Ambulatory Visit

## 2024-05-14 ENCOUNTER — Inpatient Hospital Stay

## 2024-05-14 DIAGNOSIS — R634 Abnormal weight loss: Secondary | ICD-10-CM | POA: Diagnosis not present

## 2024-05-14 DIAGNOSIS — I509 Heart failure, unspecified: Secondary | ICD-10-CM | POA: Diagnosis not present

## 2024-05-14 DIAGNOSIS — S329XXA Fracture of unspecified parts of lumbosacral spine and pelvis, initial encounter for closed fracture: Secondary | ICD-10-CM | POA: Diagnosis not present

## 2024-05-14 DIAGNOSIS — E119 Type 2 diabetes mellitus without complications: Secondary | ICD-10-CM | POA: Diagnosis not present

## 2024-05-14 DIAGNOSIS — E038 Other specified hypothyroidism: Secondary | ICD-10-CM | POA: Diagnosis not present

## 2024-05-15 DIAGNOSIS — E119 Type 2 diabetes mellitus without complications: Secondary | ICD-10-CM | POA: Diagnosis not present

## 2024-05-16 ENCOUNTER — Encounter: Payer: Self-pay | Admitting: Oncology

## 2024-05-17 ENCOUNTER — Inpatient Hospital Stay

## 2024-05-17 ENCOUNTER — Inpatient Hospital Stay: Admitting: Physician Assistant

## 2024-05-17 DIAGNOSIS — E119 Type 2 diabetes mellitus without complications: Secondary | ICD-10-CM | POA: Diagnosis not present

## 2024-05-18 DIAGNOSIS — L89153 Pressure ulcer of sacral region, stage 3: Secondary | ICD-10-CM | POA: Diagnosis not present

## 2024-05-19 ENCOUNTER — Inpatient Hospital Stay

## 2024-05-20 DIAGNOSIS — Z710 Person encountering health services to consult on behalf of another person: Secondary | ICD-10-CM | POA: Diagnosis not present

## 2024-05-21 DIAGNOSIS — I509 Heart failure, unspecified: Secondary | ICD-10-CM | POA: Diagnosis not present

## 2024-05-21 DIAGNOSIS — R739 Hyperglycemia, unspecified: Secondary | ICD-10-CM | POA: Diagnosis not present

## 2024-05-21 DIAGNOSIS — I2510 Atherosclerotic heart disease of native coronary artery without angina pectoris: Secondary | ICD-10-CM | POA: Diagnosis not present

## 2024-05-21 DIAGNOSIS — R110 Nausea: Secondary | ICD-10-CM | POA: Diagnosis not present

## 2024-05-21 DIAGNOSIS — E119 Type 2 diabetes mellitus without complications: Secondary | ICD-10-CM | POA: Diagnosis not present

## 2024-05-25 DIAGNOSIS — L89153 Pressure ulcer of sacral region, stage 3: Secondary | ICD-10-CM | POA: Diagnosis not present

## 2024-05-26 ENCOUNTER — Other Ambulatory Visit: Payer: Self-pay | Admitting: Family Medicine

## 2024-05-26 DIAGNOSIS — F339 Major depressive disorder, recurrent, unspecified: Secondary | ICD-10-CM

## 2024-05-26 DIAGNOSIS — E1069 Type 1 diabetes mellitus with other specified complication: Secondary | ICD-10-CM

## 2024-05-26 DIAGNOSIS — I252 Old myocardial infarction: Secondary | ICD-10-CM

## 2024-05-26 DIAGNOSIS — I2510 Atherosclerotic heart disease of native coronary artery without angina pectoris: Secondary | ICD-10-CM

## 2024-05-27 ENCOUNTER — Inpatient Hospital Stay

## 2024-05-27 ENCOUNTER — Inpatient Hospital Stay: Admitting: Oncology

## 2024-05-27 DIAGNOSIS — E1042 Type 1 diabetes mellitus with diabetic polyneuropathy: Secondary | ICD-10-CM | POA: Diagnosis not present

## 2024-05-28 ENCOUNTER — Ambulatory Visit: Admitting: Family Medicine

## 2024-05-28 DIAGNOSIS — K219 Gastro-esophageal reflux disease without esophagitis: Secondary | ICD-10-CM

## 2024-05-28 DIAGNOSIS — E1069 Type 1 diabetes mellitus with other specified complication: Secondary | ICD-10-CM

## 2024-05-28 DIAGNOSIS — F339 Major depressive disorder, recurrent, unspecified: Secondary | ICD-10-CM

## 2024-05-28 DIAGNOSIS — E1043 Type 1 diabetes mellitus with diabetic autonomic (poly)neuropathy: Secondary | ICD-10-CM

## 2024-05-28 DIAGNOSIS — I509 Heart failure, unspecified: Secondary | ICD-10-CM | POA: Diagnosis not present

## 2024-05-28 DIAGNOSIS — E785 Hyperlipidemia, unspecified: Secondary | ICD-10-CM | POA: Diagnosis not present

## 2024-05-28 DIAGNOSIS — E1165 Type 2 diabetes mellitus with hyperglycemia: Secondary | ICD-10-CM | POA: Diagnosis not present

## 2024-05-28 DIAGNOSIS — E038 Other specified hypothyroidism: Secondary | ICD-10-CM | POA: Diagnosis not present

## 2024-05-31 ENCOUNTER — Encounter: Payer: Self-pay | Admitting: Family Medicine

## 2024-05-31 DIAGNOSIS — Z794 Long term (current) use of insulin: Secondary | ICD-10-CM | POA: Diagnosis not present

## 2024-05-31 DIAGNOSIS — E1165 Type 2 diabetes mellitus with hyperglycemia: Secondary | ICD-10-CM | POA: Diagnosis not present

## 2024-06-02 ENCOUNTER — Inpatient Hospital Stay

## 2024-06-02 ENCOUNTER — Inpatient Hospital Stay: Admitting: Oncology

## 2024-06-02 DIAGNOSIS — L89153 Pressure ulcer of sacral region, stage 3: Secondary | ICD-10-CM | POA: Diagnosis not present

## 2024-06-03 DIAGNOSIS — I2510 Atherosclerotic heart disease of native coronary artery without angina pectoris: Secondary | ICD-10-CM | POA: Diagnosis not present

## 2024-06-03 DIAGNOSIS — I509 Heart failure, unspecified: Secondary | ICD-10-CM | POA: Diagnosis not present

## 2024-06-03 DIAGNOSIS — E038 Other specified hypothyroidism: Secondary | ICD-10-CM | POA: Diagnosis not present

## 2024-06-03 DIAGNOSIS — I10 Essential (primary) hypertension: Secondary | ICD-10-CM | POA: Diagnosis not present

## 2024-06-03 DIAGNOSIS — E1165 Type 2 diabetes mellitus with hyperglycemia: Secondary | ICD-10-CM | POA: Diagnosis not present

## 2024-06-03 DIAGNOSIS — R5381 Other malaise: Secondary | ICD-10-CM | POA: Diagnosis not present

## 2024-06-04 DIAGNOSIS — Z79899 Other long term (current) drug therapy: Secondary | ICD-10-CM | POA: Diagnosis not present

## 2024-06-04 DIAGNOSIS — E1165 Type 2 diabetes mellitus with hyperglycemia: Secondary | ICD-10-CM | POA: Diagnosis not present

## 2024-06-08 DIAGNOSIS — L89153 Pressure ulcer of sacral region, stage 3: Secondary | ICD-10-CM | POA: Diagnosis not present

## 2024-06-10 DIAGNOSIS — E1165 Type 2 diabetes mellitus with hyperglycemia: Secondary | ICD-10-CM | POA: Diagnosis not present

## 2024-06-11 ENCOUNTER — Inpatient Hospital Stay

## 2024-06-11 ENCOUNTER — Inpatient Hospital Stay: Admitting: Oncology

## 2024-06-11 DIAGNOSIS — N179 Acute kidney failure, unspecified: Secondary | ICD-10-CM | POA: Diagnosis not present

## 2024-06-11 DIAGNOSIS — E1122 Type 2 diabetes mellitus with diabetic chronic kidney disease: Secondary | ICD-10-CM | POA: Diagnosis not present

## 2024-06-11 DIAGNOSIS — E875 Hyperkalemia: Secondary | ICD-10-CM | POA: Diagnosis not present

## 2024-06-11 DIAGNOSIS — N1832 Chronic kidney disease, stage 3b: Secondary | ICD-10-CM | POA: Diagnosis not present

## 2024-06-16 DIAGNOSIS — E1165 Type 2 diabetes mellitus with hyperglycemia: Secondary | ICD-10-CM | POA: Diagnosis not present

## 2024-06-23 DIAGNOSIS — E1165 Type 2 diabetes mellitus with hyperglycemia: Secondary | ICD-10-CM | POA: Diagnosis not present

## 2024-06-24 DIAGNOSIS — E1165 Type 2 diabetes mellitus with hyperglycemia: Secondary | ICD-10-CM | POA: Diagnosis not present

## 2024-06-25 DIAGNOSIS — I10 Essential (primary) hypertension: Secondary | ICD-10-CM | POA: Diagnosis not present

## 2024-06-25 DIAGNOSIS — I509 Heart failure, unspecified: Secondary | ICD-10-CM | POA: Diagnosis not present

## 2024-06-25 DIAGNOSIS — I2510 Atherosclerotic heart disease of native coronary artery without angina pectoris: Secondary | ICD-10-CM | POA: Diagnosis not present

## 2024-06-25 DIAGNOSIS — E119 Type 2 diabetes mellitus without complications: Secondary | ICD-10-CM | POA: Diagnosis not present

## 2024-06-28 ENCOUNTER — Telehealth: Payer: Self-pay

## 2024-06-28 DIAGNOSIS — M810 Age-related osteoporosis without current pathological fracture: Secondary | ICD-10-CM | POA: Diagnosis not present

## 2024-06-28 DIAGNOSIS — E1042 Type 1 diabetes mellitus with diabetic polyneuropathy: Secondary | ICD-10-CM | POA: Diagnosis not present

## 2024-06-28 DIAGNOSIS — Z794 Long term (current) use of insulin: Secondary | ICD-10-CM | POA: Diagnosis not present

## 2024-06-28 DIAGNOSIS — E039 Hypothyroidism, unspecified: Secondary | ICD-10-CM | POA: Diagnosis not present

## 2024-06-28 DIAGNOSIS — E1065 Type 1 diabetes mellitus with hyperglycemia: Secondary | ICD-10-CM | POA: Diagnosis not present

## 2024-06-28 DIAGNOSIS — E1043 Type 1 diabetes mellitus with diabetic autonomic (poly)neuropathy: Secondary | ICD-10-CM | POA: Diagnosis not present

## 2024-06-28 NOTE — Transitions of Care (Post Inpatient/ED Visit) (Signed)
 06/28/2024  Name: Bridget Martinez MRN: [id number] DOB: 11-21-1950  Today's TOC FU Call Status: Today's TOC FU Call Status:: Successful TOC FU Call Completed TOC FU Call Complete Date: 06/28/24 Patient's Name and Date of Birth confirmed.  Transition Care Management Follow-up Telephone Call Date of Discharge: 06/25/24 Discharge Facility: Other [facility]) Name of Other (Non[facility]) Discharge Facility: [facility] Type of Discharge: Inpatient Admission Primary Inpatient Discharge Diagnosis:: Fracture of Left Pubis How have you been since you were released from the hospital?: Better Any questions or concerns?: No  Items Reviewed: Did you receive and understand the discharge instructions provided?: Yes Medications obtained,verified, and reconciled?: Yes (Medications Reviewed) Any new allergies since your discharge?: No Dietary orders reviewed?: NA Do you have support at home?: No  Medications Reviewed Today: Medications Reviewed Today     Reviewed by Lavelle Charmaine NOVAK, LPN (Licensed Practical Nurse) on 06/28/24 at 1220  Med List Status: <None>   Medication Order Taking? Sig Documenting Provider Last Dose Status Informant  aspirin  81 MG EC tablet [id number] Yes Take 1 tablet (81 mg total) by mouth daily. [name], Toribio SAUNDERS, MD  Active Child  buPROPion  (WELLBUTRIN  XL) 150 MG 24 hr tablet [id number] Yes TAKE 1 TABLET BY MOUTH TWICE A DAY Severa [name], FNP  Active   carvedilol  (COREG ) 12.5 MG tablet [id number] Yes TAKE 1 TABLET (12.5MG  TOTAL) BY MOUTH TWICE A DAY WITH MEALS [name], Toribio SAUNDERS, MD  Active Child  Continuous Blood Gluc Receiver (DEXCOM G6 RECEIVER) DEVI [id number] Yes See admin instructions. [provider]  Active Child  Continuous Blood Gluc Sensor (DEXCOM G6 SENSOR) MISC [id number] Yes See admin instructions. [provider]  Active Child  epoetin  alfa (EPOGEN ) 3000 UNIT/ML injection [id number] Yes Inject into the skin. [provider]  Active Child           Med Note ZARA, [facility] M   Wed Nov 12, 2023  8:35 AM) Epoetin  Alfa-epbx 20000 Units once every two weeks.   ferrous sulfate  325 (65 FE) MG EC tablet [id number] Yes Take 1 tablet (325 mg total) by mouth every other day. Davonna Siad, MD  Active Child  furosemide  (LASIX ) 20 MG tablet [id number] Yes Take 20 mg by mouth once a week. [provider]  Active Child  insulin  aspart (NOVOLOG  FLEXPEN) 100 UNIT/ML FlexPen [id number] Yes Inject 9-16 Units into the skin 3 (three) times daily with meals. Take 9 units at breakfast, 16 units at lunch, 14 units at bedtime [provider]  Active Child  LANTUS  SOLOSTAR 100 UNIT/ML Solostar Pen [id number] Yes Inject 12 Units into the skin at bedtime. [provider]  Active Child           Med Note ALLEGRA, [facility] I   Sat May 01, 2024  5:05 PM) Pt was given 6 units this morning.  levothyroxine  (SYNTHROID ) 75 MCG tablet [id number] Yes Take 75 mcg by mouth daily before breakfast. [provider]  Active Child  metoCLOPramide  (REGLAN ) 5 MG tablet [id number] Yes TAKE 1 TABLET BY MOUTH 3 TIMES DAILY BEFORE MEALS. Severa [name], FNP  Active   mirtazapine  (REMERON ) 15 MG tablet [id number] Yes Take 1 tablet (15 mg total) by mouth at bedtime. Severa [name], FNP  Active Child  ondansetron  (ZOFRAN ) 4 MG tablet [id number] Yes Take 4 mg by mouth every 8 (eight) hours as needed. [provider]  Active Child  pantoprazole  (PROTONIX ) 40 MG tablet [id number] Yes Take 1 tablet (40 mg total)  by mouth daily. Severa [name], FNP  Active Child  potassium chloride  (KLOR-CON ) 20 MEQ packet [id number] Yes Take 20 mEq by mouth 2 (two) times daily. [provider]  Active Child  rosuvastatin  (CRESTOR ) 40 MG tablet [id number] Yes TAKE 1 TABLET BY MOUTH EVERY DAY Severa [name], FNP  Active   sodium bicarbonate  650 MG tablet [id number] Yes Take 650 mg by mouth 3 (three) times daily. [provider]  Active  Child  traMADol  (ULTRAM ) 50 MG tablet [id number] Yes Take 1 tablet (50 mg total) by mouth every 6 (six) hours as needed for moderate pain (pain score 4-6). Trixie Nilda HERO, MD  Active             Home Care and Equipment/Supplies: Were Home Health Services Ordered?: Yes Any new equipment or medical supplies ordered?: NA  Functional Questionnaire: Do you need assistance with bathing/showering or dressing?: No Do you need assistance with meal preparation?: No Do you need assistance with eating?: No Do you have difficulty maintaining continence: No Do you need assistance with getting out of bed/getting out of a chair/moving?: Yes Do you have difficulty managing or taking your medications?: No  Follow up appointments reviewed: PCP Follow-up appointment confirmed?: No (patient states that she will have to call back to schedule) Do you need transportation to your follow-up appointment?: No Do you understand care options if your condition(s) worsen?: Yes-patient verbalized understanding    SIGNATURE Charmaine Bloodgood, LPN [facility] Advisor [place] [facility] You Are. We Are. [facility] Direct Dial [phone]

## 2024-06-28 NOTE — Telephone Encounter (Unsigned)
 Copied from CRM #[id number]. Topic: Clinical - Home Health Verbal Orders >> Jun 28, 2024  3:26 PM Delon DASEN wrote: Caller/Agency: Bridgette with [facility] Callback Number: [phone] Service Requested: Skilled Nursing and physical therapy Frequency: need eval Any new concerns about the patient? No- would like to see patient tomorrow

## 2024-06-29 ENCOUNTER — Ambulatory Visit: Admitting: Family Medicine

## 2024-06-29 ENCOUNTER — Encounter: Payer: Self-pay | Admitting: Family Medicine

## 2024-06-29 VITALS — BP 127/50 | HR 79 | Temp 98.3°F

## 2024-06-29 DIAGNOSIS — M810 Age-related osteoporosis without current pathological fracture: Secondary | ICD-10-CM | POA: Diagnosis not present

## 2024-06-29 DIAGNOSIS — N184 Chronic kidney disease, stage 4 (severe): Secondary | ICD-10-CM

## 2024-06-29 DIAGNOSIS — S32502S Unspecified fracture of left pubis, sequela: Secondary | ICD-10-CM

## 2024-06-29 DIAGNOSIS — L89153 Pressure ulcer of sacral region, stage 3: Secondary | ICD-10-CM | POA: Diagnosis not present

## 2024-06-29 DIAGNOSIS — E1065 Type 1 diabetes mellitus with hyperglycemia: Secondary | ICD-10-CM | POA: Diagnosis not present

## 2024-06-29 DIAGNOSIS — D631 Anemia in chronic kidney disease: Secondary | ICD-10-CM

## 2024-06-29 DIAGNOSIS — I5042 Chronic combined systolic (congestive) and diastolic (congestive) heart failure: Secondary | ICD-10-CM

## 2024-06-29 NOTE — Progress Notes (Signed)
 Subjective:  Patient ID: Bridget Martinez, female    DOB: 21-Nov-1950, 73 y.o.   MRN: [id number]  Patient Care Team: Severa [name], FNP as PCP - General (Family Medicine) [name], Toribio SAUNDERS, MD (Cardiology) Faythe Purchase, MD as Attending Physician (Internal Medicine) Rachele Gaynell RAMAN, MD as Consulting Physician (Nephrology) Alvia Norleen BIRCH, MD as Consulting Physician (Ophthalmology)   Chief Complaint:  Transitions Of Care (SNF after being hospitalized from fall )   HPI: Bridget Martinez is a 73 y.o. female presenting on 06/29/2024 for Transitions Of Care (SNF after being hospitalized from fall )   Bridget Martinez is a 73 year old female with osteoporosis and renal failure who presents with a recent fall resulting in multiple fractures.  Traumatic fractures - Sustained a fall in the kitchen resulting in a pelvis fracture.  - Initially minimal discomfort, with pain intensifying by Friday - Resultant fractures: lower left pelvis, lower left rib, compression fractures at T5 and L2 - No surgical intervention required - Was admitted to the hospital from 05/01/2024-05/05/2022, then went to a SNF. She was discharged home on Friday.   Decubitus ulcer - Ulcer located on the sacrum - Wound described as having a tunnel approximately one inch deep - No drainage present - Wound margins appear bright white - Some pain at the site - Ambulates with a walker  Lower extremity skin changes - Shiny and erythematous legs for the past 3-4 weeks, no weeping or drainage - Significant dead skin, treated with Eucerin resulting in new skin formation  Renal failure and anemia management - Receives sodium bicarbonate  650 mg three times daily - Receives Lasix  dose every Thursday - Receives EPO injection every two weeks for low hemoglobin  Functional status and care coordination - Discharged from skilled nursing facility on previous Friday - No home health services since discharge - Daughter  managing wound care and medication administration at home - Difficulties obtaining home health services and medical equipment, including a walker  Constitutional and cardiopulmonary symptoms - No increased shortness of breath          Relevant past medical, surgical, family, and social history reviewed and updated as indicated.  Allergies and medications reviewed and updated. Data reviewed: Chart in Epic.   Past Medical History:  Diagnosis Date   Arthritis    CHF (congestive heart failure) (HCC)    Coronary artery disease    angina.  MI.    Depression    anxiety   Diabetes mellitus    on insulin , with h/o DKA    GERD (gastroesophageal reflux disease)    Hiatal hernia.    HTN (hypertension)    Hyperlipidemia    Hypothyroidism    Left displaced femoral neck fracture (HCC) 11/19/2014   Myocardial infarction [facility])    NSTEMI 07/2011 with cardiogenic shock, s/p CABG 09/2011   Osteoporosis    Pneumonia        Tuberculosis    Venous stasis ulcers (HCC)     Past Surgical History:  Procedure Laterality Date   CARDIAC CATHETERIZATION     COLONOSCOPY N/A 11/18/2013   Procedure: COLONOSCOPY;  Surgeon: Princella HERO Nida, MD;  Location: [facility] ENDOSCOPY;  Service: Endoscopy;  Laterality: N/A;   CORONARY ARTERY BYPASS GRAFT     Dr. Fleeta Trigt in 09/2011    CORONARY ARTERY BYPASS GRAFT  2012   ESOPHAGOGASTRODUODENOSCOPY N/A 11/17/2013   Procedure: ESOPHAGOGASTRODUODENOSCOPY (EGD);  Surgeon: Princella HERO Nida, MD;  Location: [facility] ENDOSCOPY;  Service: Endoscopy;  Laterality: N/A;   LEFT HEART CATHETERIZATION WITH CORONARY ANGIOGRAM N/A 07/26/2011   Procedure: LEFT HEART CATHETERIZATION WITH CORONARY ANGIOGRAM;  Surgeon: Ezra GORMAN Shuck, MD;  Location: [facility] CATH LAB;  Service: Cardiovascular;  Laterality: N/A;   RIGHT HEART CATHETERIZATION N/A 07/29/2011   Procedure: RIGHT HEART CATH;  Surgeon: Lynwood Schilling, MD;  Location: [facility] CATH LAB;  Service: Cardiovascular;  Laterality: N/A;   TONSILLECTOMY      TOTAL HIP ARTHROPLASTY Left 11/21/2014   Procedure: TOTAL HIP ARTHROPLASTY ANTERIOR APPROACH;  Surgeon: Lonni CINDERELLA Poli, MD;  Location: [facility] OR;  Service: Orthopedics;  Laterality: Left;    Social History   Socioeconomic History   Marital status: Widowed    Spouse name: Abby; Son Zell   Number of children: 2   Years of education: 18   Highest education level: Bachelor's degree (e.g., BA, AB, BS)  Occupational History   Occupation: Retired  Tobacco Use   Smoking status: Never   Smokeless tobacco: Never  Vaping Use   Vaping status: Never Used  Substance and Sexual Activity   Alcohol use: No   Drug use: No   Sexual activity: Not on file  Other Topics Concern   Not on file  Social History Narrative   Lives alone, suffers from depression since losing her husband. Son, Zell takes her to appointments and helps her a lot.   Social Drivers of Corporate investment banker Strain: Low Risk  (04/07/2024)   Overall Financial Resource Strain (CARDIA)    Difficulty of Paying Living Expenses: Not hard at all  Food Insecurity: No Food Insecurity (05/04/2024)   Hunger Vital Sign    Worried About Running Out of Food in the Last Year: Never true    Ran Out of Food in the Last Year: Never true  Transportation Needs: No Transportation Needs (05/04/2024)   PRAPARE - Administrator, Civil Service (Medical): No    Lack of Transportation (Non-Medical): No  Physical Activity: Insufficiently Active (04/07/2024)   Exercise Vital Sign    Days of Exercise per Week: 3 days    Minutes of Exercise per Session: 30 min  Stress: No Stress Concern Present (04/07/2024)   [employer] of Occupational Health - Occupational Stress Questionnaire    Feeling of Stress: Not at all  Social Connections: Socially Isolated (05/04/2024)   Social Connection and Isolation Panel    Frequency of Communication with Friends and Family: More than three times a week    Frequency of Social Gatherings with  Friends and Family: More than three times a week    Attends Religious Services: Never    Database administrator or Organizations: No    Attends Banker Meetings: Never    Marital Status: Widowed  Intimate Partner Violence: Not At Risk (05/04/2024)   Humiliation, Afraid, Rape, and Kick questionnaire    Fear of Current or Ex-Partner: No    Emotionally Abused: No    Physically Abused: No    Sexually Abused: No    Outpatient Encounter Medications as of 06/29/2024  Medication Sig   aspirin  81 MG EC tablet Take 1 tablet (81 mg total) by mouth daily.   buPROPion  (WELLBUTRIN  XL) 150 MG 24 hr tablet TAKE 1 TABLET BY MOUTH TWICE A DAY   carvedilol  (COREG ) 12.5 MG tablet TAKE 1 TABLET (12.5MG  TOTAL) BY MOUTH TWICE A DAY WITH MEALS   Continuous Blood Gluc Receiver (DEXCOM G6 RECEIVER) DEVI See admin instructions.  Continuous Blood Gluc Sensor (DEXCOM G6 SENSOR) MISC See admin instructions.   epoetin  alfa (EPOGEN ) 3000 UNIT/ML injection Inject into the skin.   ferrous sulfate  325 (65 FE) MG EC tablet Take 1 tablet (325 mg total) by mouth every other day.   furosemide  (LASIX ) 20 MG tablet Take 20 mg by mouth once a week.   insulin  aspart (NOVOLOG  FLEXPEN) 100 UNIT/ML FlexPen Inject 9-16 Units into the skin 3 (three) times daily with meals. Take 9 units at breakfast, 16 units at lunch, 14 units at bedtime   LANTUS  SOLOSTAR 100 UNIT/ML Solostar Pen Inject 12 Units into the skin at bedtime.   levothyroxine  (SYNTHROID ) 75 MCG tablet Take 75 mcg by mouth daily before breakfast.   metoCLOPramide  (REGLAN ) 5 MG tablet TAKE 1 TABLET BY MOUTH 3 TIMES DAILY BEFORE MEALS.   mirtazapine  (REMERON ) 15 MG tablet Take 1 tablet (15 mg total) by mouth at bedtime.   ondansetron  (ZOFRAN ) 4 MG tablet Take 4 mg by mouth every 8 (eight) hours as needed.   pantoprazole  (PROTONIX ) 40 MG tablet Take 1 tablet (40 mg total) by mouth daily.   rosuvastatin  (CRESTOR ) 40 MG tablet TAKE 1 TABLET BY MOUTH EVERY DAY    sodium bicarbonate  650 MG tablet Take 650 mg by mouth 3 (three) times daily.   traMADol  (ULTRAM ) 50 MG tablet Take 1 tablet (50 mg total) by mouth every 6 (six) hours as needed for moderate pain (pain score 4-6).   [DISCONTINUED] potassium chloride  (KLOR-CON ) 20 MEQ packet Take 20 mEq by mouth 2 (two) times daily.   No facility-administered encounter medications on file as of 06/29/2024.    Allergies  Allergen Reactions   Penicillins Anaphylaxis and Hives    Tolerates zosyn  Has patient had a PCN reaction causing immediate rash, facial/tongue/throat swelling, SOB or lightheadedness with hypotension: Yes Has patient had a PCN reaction causing severe rash involving mucus membranes or skin necrosis: No Has patient had a PCN reaction that required hospitalization: Yes Has patient had a PCN reaction occurring within the last 10 years: No If all of the above answers are NO, then may proceed with Cephalosporin use.    Sulfa Antibiotics Anaphylaxis, Other (See Comments) and Swelling    Stiff neck also   Ciprofloxacin  Other (See Comments)   Macrobid  [Nitrofurantoin  Monohyd Macro] Other (See Comments)    Skin peels off   Other Other (See Comments)   Ciprofloxacin  Hcl Other (See Comments)    Skin peeling    Pertinent ROS per HPI, otherwise unremarkable      Objective:  BP (!) 127/50   Pulse 79   Temp 98.3 F (36.8 C)   SpO2 99%    Wt Readings from Last 3 Encounters:  05/05/24 132 lb 15 oz (60.3 kg)  04/07/24 135 lb (61.2 kg)  11/21/23 146 lb 9.6 oz (66.5 kg)    Physical Exam Vitals and nursing note reviewed.  Constitutional:      General: She is not in acute distress.    Appearance: She is ill-appearing (chronically ill). She is not toxic-appearing or diaphoretic.  HENT:     Head: Normocephalic and atraumatic.     Nose: Nose normal.     Mouth/Throat:     Mouth: Mucous membranes are moist.  Eyes:     Conjunctiva/sclera: Conjunctivae normal.     Pupils: Pupils are equal,  round, and reactive to light.  Cardiovascular:     Rate and Rhythm: Normal rate and regular rhythm.     Heart  sounds: Murmur heard.     Systolic murmur is present with a grade of 2/6.  Pulmonary:     Effort: Pulmonary effort is normal.     Breath sounds: Normal breath sounds.  Musculoskeletal:     Cervical back: Neck supple.     Right lower leg: 1+ Edema present.     Left lower leg: 1+ Edema present.  Skin:    General: Skin is warm and dry.     Capillary Refill: Capillary refill takes less than 2 seconds.     Findings: Wound present.      Neurological:     General: No focal deficit present.     Mental Status: She is alert and oriented to person, place, and time.     Gait: Gait abnormal (in wheelchair).  Psychiatric:        Mood and Affect: Mood normal.        Behavior: Behavior normal.        Thought Content: Thought content normal.        Judgment: Judgment normal.      Results for orders placed or performed during the hospital encounter of 05/01/24  CBC with Differential   Collection Time: 05/01/24 12:27 PM  Result Value Ref Range   WBC 11.3 (H) 4.0 - 10.5 K/uL   RBC 3.43 (L) 3.87 - 5.11 MIL/uL   Hemoglobin 9.8 (L) 12.0 - 15.0 g/dL   HCT 69.2 (L) 63.9 - 53.9 %   MCV 89.5 80.0 - 100.0 fL   MCH 28.6 26.0 - 34.0 pg   MCHC 31.9 30.0 - 36.0 g/dL   RDW 79.7 (H) 88.4 - 84.4 %   Platelets 212 150 - 400 K/uL   nRBC 0.0 0.0 - 0.2 %   Neutrophils Relative % 77 %   Neutro Abs 8.9 (H) 1.7 - 7.7 K/uL   Lymphocytes Relative 7 %   Lymphs Abs 0.8 0.7 - 4.0 K/uL   Monocytes Relative 12 %   Monocytes Absolute 1.3 (H) 0.1 - 1.0 K/uL   Eosinophils Relative 2 %   Eosinophils Absolute 0.2 0.0 - 0.5 K/uL   Basophils Relative 1 %   Basophils Absolute 0.1 0.0 - 0.1 K/uL   Immature Granulocytes 1 %   Abs Immature Granulocytes 0.07 0.00 - 0.07 K/uL  Basic metabolic panel   Collection Time: 05/01/24 12:27 PM  Result Value Ref Range   Sodium 138 135 - 145 mmol/L   Potassium 3.7 3.5 -  5.1 mmol/L   Chloride 104 98 - 111 mmol/L   CO2 23 22 - 32 mmol/L   Glucose, Bld 189 (H) 70 - 99 mg/dL   BUN 51 (H) 8 - 23 mg/dL   Creatinine, Ser 7.80 (H) 0.44 - 1.00 mg/dL   Calcium  8.9 8.9 - 10.3 mg/dL   GFR, Estimated 23 (L) >60 mL/min   Anion gap 11 5 - 15  Magnesium    Collection Time: 05/01/24  5:44 PM  Result Value Ref Range   Magnesium  1.9 1.7 - 2.4 mg/dL  Glucose, capillary   Collection Time: 05/01/24 10:13 PM  Result Value Ref Range   Glucose-Capillary 311 (H) 70 - 99 mg/dL  Type and screen   Collection Time: 05/01/24 10:32 PM  Result Value Ref Range   ABO/RH(D) B POS    Antibody Screen NEG    Sample Expiration      05/04/2024,2359 Performed at [facility], [address], [place], [place] [address]   Hepatic function panel  Collection Time: 05/01/24 10:53 PM  Result Value Ref Range   Total Protein 6.4 (L) 6.5 - 8.1 g/dL   Albumin  3.0 (L) 3.5 - 5.0 g/dL   AST 18 15 - 41 U/L   ALT 18 0 - 44 U/L   Alkaline Phosphatase 84 38 - 126 U/L   Total Bilirubin 1.0 0.0 - 1.2 mg/dL   Bilirubin, Direct 0.2 0.0 - 0.2 mg/dL   Indirect Bilirubin 0.8 0.3 - 0.9 mg/dL  Vitamin A87   Collection Time: 05/01/24 10:53 PM  Result Value Ref Range   Vitamin B-12 461 180 - 914 pg/mL  Folate   Collection Time: 05/01/24 10:53 PM  Result Value Ref Range   Folate 14.5 >5.9 ng/mL  Iron and TIBC   Collection Time: 05/01/24 10:53 PM  Result Value Ref Range   Iron 22 (L) 28 - 170 ug/dL   TIBC 782 (L) 749 - 549 ug/dL   Saturation Ratios 10 (L) 10.4 - 31.8 %   UIBC 195 ug/dL  Ferritin   Collection Time: 05/01/24 10:53 PM  Result Value Ref Range   Ferritin 735 (H) 11 - 307 ng/mL  Reticulocytes   Collection Time: 05/01/24 10:53 PM  Result Value Ref Range   Retic Ct Pct 1.1 0.4 - 3.1 %   RBC. 3.33 (L) 3.87 - 5.11 MIL/uL   Retic Count, Absolute 35.3 19.0 - 186.0 K/uL   Immature Retic Fract 9.8 2.3 - 15.9 %  Hemoglobin A1c   Collection Time: 05/01/24 10:53 PM  Result Value Ref  Range   Hgb A1c MFr Bld 8.0 (H) 4.8 - 5.6 %   Mean Plasma Glucose 182.9 mg/dL  Glucose, capillary   Collection Time: 05/02/24  6:03 AM  Result Value Ref Range   Glucose-Capillary 204 (H) 70 - 99 mg/dL  Urinalysis, Routine w reflex microscopic -Urine, Clean Catch   Collection Time: 05/02/24  9:37 AM  Result Value Ref Range   Color, Urine YELLOW YELLOW   APPearance TURBID (A) CLEAR   Specific Gravity, Urine 1.020 1.005 - 1.030   pH 7.0 5.0 - 8.0   Glucose, UA NEGATIVE NEGATIVE mg/dL   Hgb urine dipstick SMALL (A) NEGATIVE   Bilirubin Urine NEGATIVE NEGATIVE   Ketones, ur 5 (A) NEGATIVE mg/dL   Protein, ur 899 (A) NEGATIVE mg/dL   Nitrite NEGATIVE NEGATIVE   Leukocytes,Ua NEGATIVE NEGATIVE   RBC / HPF 6-10 0 - 5 RBC/hpf   WBC, UA >50 0 - 5 WBC/hpf   Bacteria, UA MANY (A) NONE SEEN   Squamous Epithelial / HPF 0-5 0 - 5 /HPF   WBC Clumps PRESENT    Mucus PRESENT    Amorphous Crystal PRESENT   Glucose, capillary   Collection Time: 05/02/24 11:51 AM  Result Value Ref Range   Glucose-Capillary 174 (H) 70 - 99 mg/dL  CBC   Collection Time: 05/02/24 12:12 PM  Result Value Ref Range   WBC 10.6 (H) 4.0 - 10.5 K/uL   RBC 3.61 (L) 3.87 - 5.11 MIL/uL   Hemoglobin 10.3 (L) 12.0 - 15.0 g/dL   HCT 68.2 (L) 63.9 - 53.9 %   MCV 87.8 80.0 - 100.0 fL   MCH 28.5 26.0 - 34.0 pg   MCHC 32.5 30.0 - 36.0 g/dL   RDW 79.5 (H) 88.4 - 84.4 %   Platelets 227 150 - 400 K/uL   nRBC 0.0 0.0 - 0.2 %  Comprehensive metabolic panel   Collection Time: 05/02/24 12:12 PM  Result Value Ref  Range   Sodium 136 135 - 145 mmol/L   Potassium 4.0 3.5 - 5.1 mmol/L   Chloride 103 98 - 111 mmol/L   CO2 20 (L) 22 - 32 mmol/L   Glucose, Bld 193 (H) 70 - 99 mg/dL   BUN 50 (H) 8 - 23 mg/dL   Creatinine, Ser 8.07 (H) 0.44 - 1.00 mg/dL   Calcium  8.7 (L) 8.9 - 10.3 mg/dL   Total Protein 6.3 (L) 6.5 - 8.1 g/dL   Albumin  2.9 (L) 3.5 - 5.0 g/dL   AST 18 15 - 41 U/L   ALT 18 0 - 44 U/L   Alkaline Phosphatase 87 38 -  126 U/L   Total Bilirubin 0.6 0.0 - 1.2 mg/dL   GFR, Estimated 27 (L) >60 mL/min   Anion gap 13 5 - 15  Glucose, capillary   Collection Time: 05/02/24  4:04 PM  Result Value Ref Range   Glucose-Capillary 215 (H) 70 - 99 mg/dL  Glucose, capillary   Collection Time: 05/02/24 10:09 PM  Result Value Ref Range   Glucose-Capillary 167 (H) 70 - 99 mg/dL  Glucose, capillary   Collection Time: 05/03/24  6:09 AM  Result Value Ref Range   Glucose-Capillary 193 (H) 70 - 99 mg/dL  Urine Culture   Collection Time: 05/03/24  9:29 AM   Specimen: Urine, Clean Catch  Result Value Ref Range   Specimen Description URINE, CLEAN CATCH    Special Requests NONE    Culture (A)     <10,000 COLONIES/mL INSIGNIFICANT GROWTH Performed at [facility], [address], [place], [place] [address]    Report Status 05/04/2024 FINAL   Glucose, capillary   Collection Time: 05/03/24 11:54 AM  Result Value Ref Range   Glucose-Capillary 228 (H) 70 - 99 mg/dL  Glucose, capillary   Collection Time: 05/03/24  4:41 PM  Result Value Ref Range   Glucose-Capillary 223 (H) 70 - 99 mg/dL  CBC with Differential/Platelet   Collection Time: 05/03/24  8:10 PM  Result Value Ref Range   WBC 9.5 4.0 - 10.5 K/uL   RBC 3.74 (L) 3.87 - 5.11 MIL/uL   Hemoglobin 10.6 (L) 12.0 - 15.0 g/dL   HCT 67.2 (L) 63.9 - 53.9 %   MCV 87.4 80.0 - 100.0 fL   MCH 28.3 26.0 - 34.0 pg   MCHC 32.4 30.0 - 36.0 g/dL   RDW 80.0 (H) 88.4 - 84.4 %   Platelets 275 150 - 400 K/uL   nRBC 0.0 0.0 - 0.2 %   Neutrophils Relative % 69 %   Neutro Abs 6.5 1.7 - 7.7 K/uL   Lymphocytes Relative 14 %   Lymphs Abs 1.4 0.7 - 4.0 K/uL   Monocytes Relative 12 %   Monocytes Absolute 1.2 (H) 0.1 - 1.0 K/uL   Eosinophils Relative 4 %   Eosinophils Absolute 0.4 0.0 - 0.5 K/uL   Basophils Relative 1 %   Basophils Absolute 0.1 0.0 - 0.1 K/uL   Immature Granulocytes 0 %   Abs Immature Granulocytes 0.04 0.00 - 0.07 K/uL  Glucose, capillary   Collection  Time: 05/03/24  9:55 PM  Result Value Ref Range   Glucose-Capillary 175 (H) 70 - 99 mg/dL  Glucose, capillary   Collection Time: 05/04/24  6:16 AM  Result Value Ref Range   Glucose-Capillary 210 (H) 70 - 99 mg/dL  Glucose, capillary   Collection Time: 05/04/24 12:20 PM  Result Value Ref Range   Glucose-Capillary 275 (H) 70 -  99 mg/dL  Glucose, capillary   Collection Time: 05/04/24  5:44 PM  Result Value Ref Range   Glucose-Capillary 220 (H) 70 - 99 mg/dL  Glucose, capillary   Collection Time: 05/04/24  8:42 PM  Result Value Ref Range   Glucose-Capillary 220 (H) 70 - 99 mg/dL  Glucose, capillary   Collection Time: 05/05/24  6:42 AM  Result Value Ref Range   Glucose-Capillary 187 (H) 70 - 99 mg/dL       Pertinent labs & imaging results that were available during my care of the patient were reviewed by me and considered in my medical decision making.  Assessment & Plan:  Avalee was seen today for transitions of care.  Diagnoses and all orders for this visit:  Closed displaced fracture of left pubis, sequela -     Ambulatory referral to Home Health  Age-related osteoporosis without current pathological fracture -     AMB Referral VBCI Care Management -     Ambulatory referral to Home Health  Pressure injury of sacral region, stage 3 (HCC) -     Ambulatory referral to Home Health  Type 1 diabetes mellitus with hyperglycemia (HCC) -     Ambulatory referral to Home Health  CKD (chronic kidney disease) stage 4, GFR 15-29 ml/min (HCC) -     Ambulatory referral to Home Health  Chronic combined systolic and diastolic congestive heart failure (HCC) -     Ambulatory referral to Home Health  Anemia in stage 4 chronic kidney disease (HCC) -     Ambulatory referral to Home Health      Transition of care management Discharged from a skilled nursing facility without home health services set up. Requires physical therapy, occupational therapy, wound care, and skilled nursing  services at home. - Ensure all orders for home health services including physical therapy, occupational therapy, wound care, and skilled nursing are in place. - Confirm that verbal orders for home health services were communicated and documented. - Make home health orders urgent to expedite care.  Decubitus ulcer of sacral region Decubitus ulcer on the sacral region with approximately 1-inch tunneling. The wound is currently dry with bright white margins, indicating scar tissue formation. No drainage noted. - Ensure wound care orders are in place for home health to assess and manage the ulcer. - Consider medicated packing for the wound once home health services are initiated.  Multiple acute fractures: left lower pelvis, left lower rib, T5 and L2 vertebrae Multiple acute fractures sustained from a fall, including left lower pelvis, left lower rib, and compression fractures at T5 and L2 vertebrae. No surgical intervention required.  Osteoporosis Osteoporosis with recent fractures. Discussion with endocrinologist regarding bone density medication. Consideration of renal function in medication choice. Prolia (denosumab) is considered the best option due to its biannual administration and insurance coverage. - Schedule consultation with clinical pharmacist Mliss for osteoporosis management. - Discuss potential use of Prolia (denosumab) as a treatment option, considering insurance coverage and renal function. - Consider future DEXA scan when she is able to tolerate the procedure.  Chronic kidney disease - Followed by nephrology.   Anemia due to chronic kidney disease Anemia secondary to chronic kidney disease. Hemoglobin levels are low, and she receives EPO injections biweekly. - Continue EPO injections as scheduled. - Coordinate with home health to potentially administer EPO injections at home.  Type 1 diabetes mellitus Type 1 diabetes mellitus with recent changes in insulin  regimen by  endocrinologist. Blood sugar levels are monitored using  the Clarify app.  Edema and erythema of lower extremities Edema and erythema of lower extremities, likely related to chronic conditions. No increased shortness of breath or significant swelling noted. - Follow up with podiatrist for ongoing foot care.        Continue all other maintenance medications.  Follow up plan: Return in about 3 months (around 09/29/2024), or if symptoms worsen or fail to improve, for chronic care .   The above assessment and management plan was discussed with the patient. The patient verbalized understanding of and has agreed to the management plan. Patient is aware to call the clinic if they develop any new symptoms or if symptoms persist or worsen. Patient is aware when to return to the clinic for a follow-up visit. Patient educated on when it is appropriate to go to the emergency department.   Rosaline Bruns, FNP-C [facility] [phone]

## 2024-06-29 NOTE — Telephone Encounter (Signed)
Verbal orders given per office protocol. 

## 2024-06-30 ENCOUNTER — Telehealth: Payer: Self-pay

## 2024-06-30 DIAGNOSIS — S22058D Other fracture of T5-T6 vertebra, subsequent encounter for fracture with routine healing: Secondary | ICD-10-CM | POA: Diagnosis not present

## 2024-06-30 DIAGNOSIS — E1165 Type 2 diabetes mellitus with hyperglycemia: Secondary | ICD-10-CM | POA: Diagnosis not present

## 2024-06-30 DIAGNOSIS — L89153 Pressure ulcer of sacral region, stage 3: Secondary | ICD-10-CM | POA: Diagnosis not present

## 2024-06-30 DIAGNOSIS — S32592D Other specified fracture of left pubis, subsequent encounter for fracture with routine healing: Secondary | ICD-10-CM | POA: Diagnosis not present

## 2024-06-30 DIAGNOSIS — S2232XD Fracture of one rib, left side, subsequent encounter for fracture with routine healing: Secondary | ICD-10-CM | POA: Diagnosis not present

## 2024-06-30 DIAGNOSIS — E1159 Type 2 diabetes mellitus with other circulatory complications: Secondary | ICD-10-CM | POA: Diagnosis not present

## 2024-06-30 DIAGNOSIS — E1122 Type 2 diabetes mellitus with diabetic chronic kidney disease: Secondary | ICD-10-CM | POA: Diagnosis not present

## 2024-06-30 DIAGNOSIS — I5042 Chronic combined systolic (congestive) and diastolic (congestive) heart failure: Secondary | ICD-10-CM | POA: Diagnosis not present

## 2024-06-30 DIAGNOSIS — I152 Hypertension secondary to endocrine disorders: Secondary | ICD-10-CM | POA: Diagnosis not present

## 2024-06-30 NOTE — Telephone Encounter (Signed)
 Copied from CRM [id number]. Topic: Clinical - Home Health Verbal Orders >> Jun 30, 2024  1:56 PM Rosaria BRAVO wrote: Caller/Agency: Brittney RN Hedda  Callback Number: [phone] Service Requested: Skilled Nursing Frequency: 2w2 1w8 Any new concerns about the patient? Yes.  Wants home health aid to assist with bathing 1w8  Suggesting a different wound care order Wants to cleanse and pack her wound on her sacrum with iota form once a day, cover with a foam adhesive border.   Question. Pt wants to know if the PCP can order a medical alert device/bracelet?

## 2024-06-30 NOTE — Telephone Encounter (Signed)
 Suggesting a different wound care order Wants to cleanse and pack her wound on her sacrum with iota form once a day, cover with a foam adhesive border.    Question. Pt wants to know if the PCP can order a medical alert device/bracelet?   PCP OKAY WITH CHANGING WOUND CARE ORDER AND ORDERING MEDICAL ALERT DEVICE/BRACELET?

## 2024-07-01 ENCOUNTER — Inpatient Hospital Stay: Attending: Hematology

## 2024-07-01 ENCOUNTER — Inpatient Hospital Stay

## 2024-07-01 ENCOUNTER — Inpatient Hospital Stay: Admitting: Oncology

## 2024-07-01 ENCOUNTER — Telehealth: Payer: Self-pay

## 2024-07-01 VITALS — BP 123/56 | HR 65 | Temp 97.3°F | Resp 17 | Wt 133.6 lb

## 2024-07-01 DIAGNOSIS — N184 Chronic kidney disease, stage 4 (severe): Secondary | ICD-10-CM | POA: Insufficient documentation

## 2024-07-01 DIAGNOSIS — D631 Anemia in chronic kidney disease: Secondary | ICD-10-CM

## 2024-07-01 LAB — IRON AND TIBC
Iron: 70 ug/dL (ref 28–170)
Saturation Ratios: 25 % (ref 10.4–31.8)
TIBC: 280 ug/dL (ref 250–450)
UIBC: 210 ug/dL

## 2024-07-01 LAB — FOLATE: Folate: 15.8 ng/mL (ref >5.9)

## 2024-07-01 LAB — COMPREHENSIVE METABOLIC PANEL WITH GFR
ALT: 16 U/L (ref 0–44)
AST: 20 U/L (ref 15–41)
Albumin: 4.2 g/dL (ref 3.5–5.0)
Alkaline Phosphatase: 161 U/L — ABNORMAL HIGH (ref 38–126)
Anion gap: 12 (ref 5–15)
BUN: 43 mg/dL — ABNORMAL HIGH (ref 8–23)
CO2: 25 mmol/L (ref 22–32)
Calcium: 9.2 mg/dL (ref 8.9–10.3)
Chloride: 99 mmol/L (ref 98–111)
Creatinine, Ser: 1.68 mg/dL — ABNORMAL HIGH (ref 0.44–1.00)
GFR, Estimated: 32 mL/min — ABNORMAL LOW (ref >60)
Glucose, Bld: 214 mg/dL — ABNORMAL HIGH (ref 70–99)
Potassium: 4.4 mmol/L (ref 3.5–5.1)
Sodium: 135 mmol/L (ref 135–145)
Total Bilirubin: 0.5 mg/dL (ref 0.0–1.2)
Total Protein: 7.2 g/dL (ref 6.5–8.1)

## 2024-07-01 LAB — CBC WITH DIFFERENTIAL/PLATELET
Abs Immature Granulocytes: 0.03 K/uL (ref 0.00–0.07)
Basophils Absolute: 0.1 K/uL (ref 0.0–0.1)
Basophils Relative: 1 %
Eosinophils Absolute: 0.1 K/uL (ref 0.0–0.5)
Eosinophils Relative: 2 %
HCT: 29.9 % — ABNORMAL LOW (ref 36.0–46.0)
Hemoglobin: 9.6 g/dL — ABNORMAL LOW (ref 12.0–15.0)
Immature Granulocytes: 0 %
Lymphocytes Relative: 16 %
Lymphs Abs: 1.3 K/uL (ref 0.7–4.0)
MCH: 29.4 pg (ref 26.0–34.0)
MCHC: 32.1 g/dL (ref 30.0–36.0)
MCV: 91.7 fL (ref 80.0–100.0)
Monocytes Absolute: 0.8 K/uL (ref 0.1–1.0)
Monocytes Relative: 9 %
Neutro Abs: 5.8 K/uL (ref 1.7–7.7)
Neutrophils Relative %: 72 %
Platelets: 274 K/uL (ref 150–400)
RBC: 3.26 MIL/uL — ABNORMAL LOW (ref 3.87–5.11)
RDW: 18.5 % — ABNORMAL HIGH (ref 11.5–15.5)
WBC: 8 K/uL (ref 4.0–10.5)
nRBC: 0 % (ref 0.0–0.2)

## 2024-07-01 LAB — FERRITIN: Ferritin: 858 ng/mL — ABNORMAL HIGH (ref 11–307)

## 2024-07-01 LAB — VITAMIN B12: Vitamin B-12: 1337 pg/mL — ABNORMAL HIGH (ref 180–914)

## 2024-07-01 MED ORDER — EPOETIN ALFA-EPBX 20000 UNIT/ML IJ SOLN
20000.0000 [IU] | Freq: Once | INTRAMUSCULAR | Status: AC
  Administered 2024-07-01: 20000 [IU] via SUBCUTANEOUS
  Filled 2024-07-01: qty 1

## 2024-07-01 NOTE — Progress Notes (Signed)
 Care Guide Pharmacy Note  07/01/2024 Name: Bridget Martinez MRN: [id number] DOB: November 14, 1950  Referred By: Severa [name], FNP Reason for referral: Complex Care Management (Outreach to schedule with pharm d )   Bridget Martinez is a 73 y.o. year old female who is a primary care patient of [name], [name], FNP.  Bridget Martinez was referred to the pharmacist for assistance related to: osteoporosis   An unsuccessful telephone outreach was attempted today to contact the patient who was referred to the pharmacy team for assistance with medication management. Additional attempts will be made to contact the patient.  Jeoffrey Buffalo , RMA     [facility]  [facility], [facility] Guide  Direct Dial: [phone]  Website: [email or web address]

## 2024-07-01 NOTE — Progress Notes (Signed)
 [facility] at [facility]  HEMATOLOGY FOLLOW-UP VISIT  [name], [name], FNP  REASON FOR FOLLOW-UP: Anemia of chronic kidney disease  ASSESSMENT & PLAN:  Patient is a 73 y.o. female with chronic kidney disease following for anemia of chronic kidney disease.  Currently on EPO shots  Assessment and Plan Assessment & Plan Anemia in the setting of chronic kidney disease Patient has stable hemoglobin and has been receiving EPO shots.  Iron labs did not show evidence of iron deficiency.   SPEP negative for M spike.  Free light chain slightly elevated but normal ratio Patient did not have significant improvement with the ESA dosage of 10,000 units every 2 weeks Currently on ESA dosing 20,000 units every 2 weeks.     - Continue ESA every 2 weeks -Labs reviewed today, hemoglobin of 9.6, TSAT: 25, ferritin: 858.  Will administer ESA today. - Monitor hemoglobin levels. - Maintain a TSAT goal of 25-30 in CKD   Return to clinic in 3 months with labs.  Chronic kidney disease Improved creatinine levels indicating better kidney function.  Impaired mobility due to recent fall Impaired mobility post-fall, using a walker, improved post-rehabilitation.  - Advised caution while using the walker.    No orders of the defined types were placed in this encounter.   The total time spent in the appointment was 20 minutes encounter with patients including review of chart and various tests results, discussions about plan of care and coordination of care plan   All questions were answered. The patient knows to call the clinic with any problems, questions or concerns. No barriers to learning was detected.  Mickiel Dry, MD 10/23/202510:42 PM    INTERVAL HISTORY: Discussed the use of AI scribe software for clinical note transcription with the patient, who gave verbal consent to proceed.  History of Present Illness Bridget Martinez is a 73 year old female who presents for  follow-up for anemia of chronic kidney disease.    She was hospitalized at the beginning of August for six days and subsequently stayed at a rehabilitation facility named [facility] after a fall. Since her discharge, she has been using a walker to assist with mobility and reports that she is doing better.  Her recent laboratory results show that her creatinine level is better than before. Her hemoglobin level is currently at 9.6, an improvement from 8.4 two years ago. She is receiving EPO injections every two weeks to manage her anemia.  Patient received EPO injections while she was at rehab and did not miss doses.  She is currently walking with a walker and is careful with her movements.   I have reviewed the past medical history, past surgical history, social history and family history with the patient   ALLERGIES:  is allergic to penicillins, sulfa antibiotics, ciprofloxacin , macrobid  [nitrofurantoin  monohyd macro], other, and ciprofloxacin  hcl.  MEDICATIONS:  Current Outpatient Medications  Medication Sig Dispense Refill   aspirin  81 MG EC tablet Take 1 tablet (81 mg total) by mouth daily. 30 tablet 3   buPROPion  (WELLBUTRIN  XL) 150 MG 24 hr tablet TAKE 1 TABLET BY MOUTH TWICE A DAY 180 tablet 0   carvedilol  (COREG ) 12.5 MG tablet TAKE 1 TABLET (12.5MG  TOTAL) BY MOUTH TWICE A DAY WITH MEALS 60 tablet 10   Continuous Blood Gluc Receiver (DEXCOM G6 RECEIVER) DEVI See admin instructions.     Continuous Blood Gluc Sensor (DEXCOM G6 SENSOR) MISC See admin instructions.     epoetin  alfa (  EPOGEN ) 3000 UNIT/ML injection Inject into the skin.     ferrous sulfate  325 (65 FE) MG EC tablet Take 1 tablet (325 mg total) by mouth every other day. 45 tablet 3   furosemide  (LASIX ) 20 MG tablet Take 20 mg by mouth once a week.     insulin  aspart (NOVOLOG  FLEXPEN) 100 UNIT/ML FlexPen Inject 9-16 Units into the skin 3 (three) times daily with meals. Take 9 units at breakfast, 16 units at lunch, 14 units at  bedtime     LANTUS  SOLOSTAR 100 UNIT/ML Solostar Pen Inject 12 Units into the skin at bedtime.     levothyroxine  (SYNTHROID ) 75 MCG tablet Take 75 mcg by mouth daily before breakfast.     metoCLOPramide  (REGLAN ) 5 MG tablet TAKE 1 TABLET BY MOUTH 3 TIMES DAILY BEFORE MEALS. 270 tablet 0   mirtazapine  (REMERON ) 15 MG tablet Take 1 tablet (15 mg total) by mouth at bedtime. 90 tablet 1   ondansetron  (ZOFRAN ) 4 MG tablet Take 4 mg by mouth every 8 (eight) hours as needed.     pantoprazole  (PROTONIX ) 40 MG tablet Take 1 tablet (40 mg total) by mouth daily. 90 tablet 1   rosuvastatin  (CRESTOR ) 40 MG tablet TAKE 1 TABLET BY MOUTH EVERY DAY 90 tablet 0   sodium bicarbonate  650 MG tablet Take 650 mg by mouth 3 (three) times daily.     traMADol  (ULTRAM ) 50 MG tablet Take 1 tablet (50 mg total) by mouth every 6 (six) hours as needed for moderate pain (pain score 4-6). 12 tablet 0   No current facility-administered medications for this visit.     REVIEW OF SYSTEMS:   Constitutional: Denies fevers, chills or night sweats Eyes: Denies blurriness of vision Ears, nose, mouth, throat, and face: Denies mucositis or sore throat Respiratory: Denies cough, dyspnea or wheezes Cardiovascular: Denies palpitation, chest discomfort or lower extremity swelling Gastrointestinal:  Denies nausea, heartburn or change in bowel habits Skin: Denies abnormal skin rashes Lymphatics: Denies new lymphadenopathy or easy bruising Neurological:Denies numbness, tingling or new weaknesses Behavioral/Psych: Mood is stable, no new changes  All other systems were reviewed with the patient and are negative.  PHYSICAL EXAMINATION:   Vitals:   07/01/24 0934  BP: (!) 123/56  Pulse: 65  Resp: 17  Temp: (!) 97.3 F (36.3 C)  SpO2: 100%    GENERAL:alert, no distress and comfortable, frail female SKIN: skin color, texture, turgor are normal, no rashes or significant lesions LUNGS: clear to auscultation and percussion with  normal breathing effort HEART: regular rate & rhythm and no murmurs and no lower extremity edema ABDOMEN:abdomen soft, non-tender and normal bowel sounds Musculoskeletal:no cyanosis of digits and no clubbing  NEURO: alert & oriented x 3 with fluent speech  LABORATORY DATA:  I have reviewed the data as listed  Lab Results  Component Value Date   WBC 8.0 07/01/2024   NEUTROABS 5.8 07/01/2024   HGB 9.6 (L) 07/01/2024   HCT 29.9 (L) 07/01/2024   MCV 91.7 07/01/2024   PLT 274 07/01/2024       Chemistry      Component Value Date/Time   NA 135 07/01/2024 0907   NA 139 11/21/2023 0927   K 4.4 07/01/2024 0907   CL 99 07/01/2024 0907   CO2 25 07/01/2024 0907   BUN 43 (H) 07/01/2024 0907   BUN 32 (H) 11/21/2023 0927   CREATININE 1.68 (H) 07/01/2024 0907   GLU 416 01/18/2022 0000      Component Value Date/Time  CALCIUM  9.2 07/01/2024 0907   ALKPHOS 161 (H) 07/01/2024 0907   AST 20 07/01/2024 0907   ALT 16 07/01/2024 0907   BILITOT 0.5 07/01/2024 0907   BILITOT 0.4 11/21/2023 0927      Latest Reference Range & Units 10/22/23 14:10  Total Protein ELP 6.0 - 8.5 g/dL 6.4 (C)  Albumin  SerPl Elph-Mcnc 2.9 - 4.4 g/dL 3.6 (C)  Albumin /Glob SerPl 0.7 - 1.7  1.3 (C)  Alpha2 Glob SerPl Elph-Mcnc 0.4 - 1.0 g/dL 0.7 (C)  Alpha 1 0.0 - 0.4 g/dL 0.2 (C)  Gamma Glob SerPl Elph-Mcnc 0.4 - 1.8 g/dL 1.0 (C)  M Protein SerPl Elph-Mcnc Not Observed g/dL Not Observed (C)  IFE 1  Comment (C)  Globulin, Total 2.2 - 3.9 g/dL 2.8 (C)  B-Globulin SerPl Elph-Mcnc 0.7 - 1.3 g/dL 0.9 (C)  IgG (Immunoglobin G), Serum 586 - 1,602 mg/dL 8,978  IgM (Immunoglobulin M), Srm 26 - 217 mg/dL 88  IgA 64 - 577 mg/dL 767  (C): Corrected   Latest Reference Range & Units 07/01/24 09:07  Iron 28 - 170 ug/dL 70  UIBC ug/dL 789  TIBC 749 - 549 ug/dL 719  Saturation Ratios 10.4 - 31.8 % 25  Ferritin 11 - 307 ng/mL 858 (H)  Folate >5.9 ng/mL 15.8  Vitamin B12 180 - 914 pg/mL 1,337 (H)  (H): Data is abnormally  high

## 2024-07-01 NOTE — Patient Instructions (Addendum)
 [facility] at [facility] Discharge Instructions   You were seen and examined today by Dr. Davonna.  She reviewed the results of your lab work which are normal/stable. Your hemoglobin is 9.6 today.   We will proceed with your injection today.   We will see you back in 3 months.   Return as scheduled.    Thank you for choosing [facility] at [facility] to provide your oncology and hematology care.  To afford each patient quality time with our provider, please arrive at least 15 minutes before your scheduled appointment time.   If you have a lab appointment with the Cancer Center please come in thru the Main Entrance and check in at the main information desk.  You need to re-schedule your appointment should you arrive 10 or more minutes late.  We strive to give you quality time with our providers, and arriving late affects you and other patients whose appointments are after yours.  Also, if you no show three or more times for appointments you may be dismissed from the clinic at the providers discretion.     Again, thank you for choosing [facility].  Our hope is that these requests will decrease the amount of time that you wait before being seen by our physicians.       _____________________________________________________________  Should you have questions after your visit to [facility], please contact our office at [phone] and follow the prompts.  Our office hours are 8:00 a.m. and 4:30 p.m. Monday - Friday.  Please note that voicemails left after 4:00 p.m. may not be returned until the following business day.  We are closed weekends and major holidays.  You do have access to a nurse 24-7, just call the main number to the clinic [phone] and do not press any options, hold on the line and a nurse will answer the phone.    For prescription refill requests, have your pharmacy contact our office and allow 72  hours.    Due to Covid, you will need to wear a mask upon entering the hospital. If you do not have a mask, a mask will be given to you at the Main Entrance upon arrival. For doctor visits, patients may have 1 support person age 22 or older with them. For treatment visits, patients can not have anyone with them due to social distancing guidelines and our immunocompromised population.

## 2024-07-01 NOTE — Progress Notes (Signed)
 Patient's Hgb 9.6 and blood pressure stable. Patient  tolerated injection with no complaints voiced.  Site clean and dry with no bruising or swelling noted at site.  See MAR for details.  Band aid applied.  Patient stable during and after injection.  Vss with discharge and left in satisfactory condition with no s/s of distress noted. All follow ups as scheduled.   Bridget Martinez

## 2024-07-01 NOTE — Telephone Encounter (Signed)
 LMOVM to Niki w/ [facility] VO given for nursing frequency, [facility] aide & wound care order.  Script written for medical alert device/bracelet and placed on PCP's desk.

## 2024-07-01 NOTE — Patient Instructions (Signed)

## 2024-07-02 DIAGNOSIS — S2232XD Fracture of one rib, left side, subsequent encounter for fracture with routine healing: Secondary | ICD-10-CM | POA: Diagnosis not present

## 2024-07-02 DIAGNOSIS — S22058D Other fracture of T5-T6 vertebra, subsequent encounter for fracture with routine healing: Secondary | ICD-10-CM | POA: Diagnosis not present

## 2024-07-02 DIAGNOSIS — E1122 Type 2 diabetes mellitus with diabetic chronic kidney disease: Secondary | ICD-10-CM | POA: Diagnosis not present

## 2024-07-02 DIAGNOSIS — I152 Hypertension secondary to endocrine disorders: Secondary | ICD-10-CM | POA: Diagnosis not present

## 2024-07-02 DIAGNOSIS — L89153 Pressure ulcer of sacral region, stage 3: Secondary | ICD-10-CM | POA: Diagnosis not present

## 2024-07-02 DIAGNOSIS — E1165 Type 2 diabetes mellitus with hyperglycemia: Secondary | ICD-10-CM | POA: Diagnosis not present

## 2024-07-02 DIAGNOSIS — S32592D Other specified fracture of left pubis, subsequent encounter for fracture with routine healing: Secondary | ICD-10-CM | POA: Diagnosis not present

## 2024-07-02 DIAGNOSIS — E1159 Type 2 diabetes mellitus with other circulatory complications: Secondary | ICD-10-CM | POA: Diagnosis not present

## 2024-07-05 ENCOUNTER — Telehealth: Payer: Self-pay | Admitting: Family Medicine

## 2024-07-05 NOTE — Telephone Encounter (Signed)
 Number that was left was not correct. Call will not go through. Please obtain a different number if they call back.

## 2024-07-05 NOTE — Telephone Encounter (Signed)
 Copied from CRM [id number]. Topic: Clinical - Home Health Verbal Orders >> Jul 05, 2024 10:01 AM Jasmin G wrote: Caller/Agency: Ms. Shirlean from [facility] Callback Number: [phone] Service Requested: Physical Therapy Frequency: 1 time a week for 8 weeks Any new concerns about the patient? No

## 2024-07-06 ENCOUNTER — Other Ambulatory Visit (HOSPITAL_COMMUNITY): Payer: Self-pay | Admitting: Internal Medicine

## 2024-07-06 DIAGNOSIS — E1165 Type 2 diabetes mellitus with hyperglycemia: Secondary | ICD-10-CM | POA: Diagnosis not present

## 2024-07-06 DIAGNOSIS — I5042 Chronic combined systolic (congestive) and diastolic (congestive) heart failure: Secondary | ICD-10-CM | POA: Diagnosis not present

## 2024-07-06 DIAGNOSIS — E1159 Type 2 diabetes mellitus with other circulatory complications: Secondary | ICD-10-CM | POA: Diagnosis not present

## 2024-07-06 DIAGNOSIS — S2232XD Fracture of one rib, left side, subsequent encounter for fracture with routine healing: Secondary | ICD-10-CM | POA: Diagnosis not present

## 2024-07-06 DIAGNOSIS — I152 Hypertension secondary to endocrine disorders: Secondary | ICD-10-CM | POA: Diagnosis not present

## 2024-07-06 DIAGNOSIS — L89153 Pressure ulcer of sacral region, stage 3: Secondary | ICD-10-CM | POA: Diagnosis not present

## 2024-07-06 DIAGNOSIS — E1122 Type 2 diabetes mellitus with diabetic chronic kidney disease: Secondary | ICD-10-CM | POA: Diagnosis not present

## 2024-07-06 DIAGNOSIS — S32592D Other specified fracture of left pubis, subsequent encounter for fracture with routine healing: Secondary | ICD-10-CM | POA: Diagnosis not present

## 2024-07-06 DIAGNOSIS — S22058D Other fracture of T5-T6 vertebra, subsequent encounter for fracture with routine healing: Secondary | ICD-10-CM | POA: Diagnosis not present

## 2024-07-06 NOTE — Telephone Encounter (Signed)
 LMTCB about prescription for medical alert device / bracelet, does she want this faxed somewhere or does she want to pick up the script.

## 2024-07-07 ENCOUNTER — Telehealth: Payer: Self-pay

## 2024-07-07 NOTE — Telephone Encounter (Signed)
 LMOVM for Bridget Martinez return her call from today that one of the other nurses left her a message given her VO for PT frequency, gave her my direct ph # if she needed anything else.

## 2024-07-07 NOTE — Telephone Encounter (Signed)
 Left detailed message giving Home Health Nurse approval on PT order. Also made [facility] aware that nurse requested a call back from her.

## 2024-07-07 NOTE — Telephone Encounter (Signed)
 Copied from CRM [id number]. Topic: Clinical - Home Health Verbal Orders >> Jul 05, 2024 10:01 AM Jasmin G wrote: Caller/Agency: Ms. Shirlean from [facility] Callback Number: [phone] Service Requested: Physical Therapy Frequency: 1 time a week for 8 weeks Any new concerns about the patient? No >> Jul 07, 2024  9:44 AM Antwanette L wrote: Hildegard from [facility] is calling to follow up on the approval for physical therapy. Please have Donny to contact [place] today at  [phone] or [phone]

## 2024-07-08 ENCOUNTER — Telehealth: Payer: Self-pay | Admitting: Family Medicine

## 2024-07-08 DIAGNOSIS — S2232XD Fracture of one rib, left side, subsequent encounter for fracture with routine healing: Secondary | ICD-10-CM | POA: Diagnosis not present

## 2024-07-08 NOTE — Telephone Encounter (Signed)
Script faxed to [facility] .  

## 2024-07-08 NOTE — Telephone Encounter (Unsigned)
 Copied from CRM [id number]. Topic: Clinical - Home Health Verbal Orders >> Jul 08, 2024  3:13 PM Montie POUR wrote: Caller/Agency: [facility] Callback Number: [phone] Service Requested: Occupational Therapy, Home Health Aide Frequency: To complete evaluations for both Any new concerns about the patient? Yes Please rush per [name], daughter's request.

## 2024-07-09 ENCOUNTER — Telehealth: Payer: Self-pay | Admitting: Family Medicine

## 2024-07-09 ENCOUNTER — Ambulatory Visit

## 2024-07-09 DIAGNOSIS — S32592D Other specified fracture of left pubis, subsequent encounter for fracture with routine healing: Secondary | ICD-10-CM | POA: Diagnosis not present

## 2024-07-09 DIAGNOSIS — S2232XD Fracture of one rib, left side, subsequent encounter for fracture with routine healing: Secondary | ICD-10-CM

## 2024-07-09 DIAGNOSIS — L89153 Pressure ulcer of sacral region, stage 3: Secondary | ICD-10-CM | POA: Diagnosis not present

## 2024-07-09 DIAGNOSIS — E1165 Type 2 diabetes mellitus with hyperglycemia: Secondary | ICD-10-CM

## 2024-07-09 DIAGNOSIS — E1122 Type 2 diabetes mellitus with diabetic chronic kidney disease: Secondary | ICD-10-CM

## 2024-07-09 DIAGNOSIS — E039 Hypothyroidism, unspecified: Secondary | ICD-10-CM

## 2024-07-09 DIAGNOSIS — I5042 Chronic combined systolic (congestive) and diastolic (congestive) heart failure: Secondary | ICD-10-CM | POA: Diagnosis not present

## 2024-07-09 DIAGNOSIS — S22058D Other fracture of T5-T6 vertebra, subsequent encounter for fracture with routine healing: Secondary | ICD-10-CM

## 2024-07-09 DIAGNOSIS — N184 Chronic kidney disease, stage 4 (severe): Secondary | ICD-10-CM

## 2024-07-09 DIAGNOSIS — D631 Anemia in chronic kidney disease: Secondary | ICD-10-CM

## 2024-07-09 DIAGNOSIS — I152 Hypertension secondary to endocrine disorders: Secondary | ICD-10-CM

## 2024-07-09 DIAGNOSIS — E1159 Type 2 diabetes mellitus with other circulatory complications: Secondary | ICD-10-CM

## 2024-07-09 NOTE — Telephone Encounter (Signed)
 Faxed to [facility]

## 2024-07-09 NOTE — Telephone Encounter (Signed)
 Copied from CRM #[id number]. Topic: Clinical - Prescription Issue >> Jul 08, 2024  4:51 PM Kevelyn M wrote: Reason for CRM: [facility] is saying they can not take this prescription for 'medical device' it needs to be sent to a medical supply servicer

## 2024-07-09 NOTE — Telephone Encounter (Signed)
 TC to [facility] VO given to add OT & HH aide for eval.

## 2024-07-12 ENCOUNTER — Telehealth: Payer: Self-pay | Admitting: Family Medicine

## 2024-07-12 DIAGNOSIS — I5042 Chronic combined systolic (congestive) and diastolic (congestive) heart failure: Secondary | ICD-10-CM | POA: Diagnosis not present

## 2024-07-12 DIAGNOSIS — S32592D Other specified fracture of left pubis, subsequent encounter for fracture with routine healing: Secondary | ICD-10-CM | POA: Diagnosis not present

## 2024-07-12 DIAGNOSIS — S22058D Other fracture of T5-T6 vertebra, subsequent encounter for fracture with routine healing: Secondary | ICD-10-CM | POA: Diagnosis not present

## 2024-07-12 DIAGNOSIS — I152 Hypertension secondary to endocrine disorders: Secondary | ICD-10-CM | POA: Diagnosis not present

## 2024-07-12 DIAGNOSIS — E1159 Type 2 diabetes mellitus with other circulatory complications: Secondary | ICD-10-CM | POA: Diagnosis not present

## 2024-07-12 DIAGNOSIS — L89153 Pressure ulcer of sacral region, stage 3: Secondary | ICD-10-CM | POA: Diagnosis not present

## 2024-07-12 DIAGNOSIS — E1122 Type 2 diabetes mellitus with diabetic chronic kidney disease: Secondary | ICD-10-CM | POA: Diagnosis not present

## 2024-07-12 DIAGNOSIS — E1165 Type 2 diabetes mellitus with hyperglycemia: Secondary | ICD-10-CM | POA: Diagnosis not present

## 2024-07-12 DIAGNOSIS — S2232XD Fracture of one rib, left side, subsequent encounter for fracture with routine healing: Secondary | ICD-10-CM | POA: Diagnosis not present

## 2024-07-12 NOTE — Progress Notes (Signed)
 Care Guide Pharmacy Note  07/12/2024 Name: CARIA TRANSUE MRN: [id number] DOB: [birth year]  Referred By: Severa [name], FNP Reason for referral: Complex Care Management (Outreach to schedule with pharm d )   AZELIE NOGUERA is a 73 y.o. year old female who is a primary care patient of [name], [name], FNP.  Zebedee LULLA Corona was referred to the pharmacist for assistance related to: osteoporosis   Successful contact was made with the patient to discuss pharmacy services including being ready for the pharmacist to call at least 5 minutes before the scheduled appointment time and to have medication bottles and any blood pressure readings ready for review. The patient agreed to meet with the pharmacist via telephone visit on (date/time).07/28/2024  Jeoffrey Buffalo , RMA     [place] [facility]  [facility], [facility] Guide  Direct Dial: [phone]  Website: [email or web address]

## 2024-07-12 NOTE — Telephone Encounter (Unsigned)
 Copied from CRM [id number]. Topic: Clinical - Order For Equipment >> Jul 12, 2024  2:01 PM Avram MATSU wrote: Reason for CRM: brittney is calling from [facility] requesting orders for her wound care but would like to add collagen with sliver.

## 2024-07-13 NOTE — Telephone Encounter (Signed)
 Attempted to contact HH- NVM

## 2024-07-13 NOTE — Telephone Encounter (Unsigned)
 Copied from CRM #[id number]. Topic: Clinical - Home Health Verbal Orders >> Jul 13, 2024  9:30 AM Miquel SAILOR wrote: Caller/Agency: Delon from [facility] Callback Number: [phone] Service Requested: Occupational Therapy Frequency: 1 week 7 therapeutical exercise /Training  Any new concerns about the patient? No

## 2024-07-13 NOTE — Telephone Encounter (Signed)
 Attempted to contact HH back and NVM   Okay to give verbal when calling back

## 2024-07-14 DIAGNOSIS — E1165 Type 2 diabetes mellitus with hyperglycemia: Secondary | ICD-10-CM | POA: Diagnosis not present

## 2024-07-14 DIAGNOSIS — E1159 Type 2 diabetes mellitus with other circulatory complications: Secondary | ICD-10-CM | POA: Diagnosis not present

## 2024-07-14 DIAGNOSIS — S32592D Other specified fracture of left pubis, subsequent encounter for fracture with routine healing: Secondary | ICD-10-CM | POA: Diagnosis not present

## 2024-07-14 DIAGNOSIS — I152 Hypertension secondary to endocrine disorders: Secondary | ICD-10-CM | POA: Diagnosis not present

## 2024-07-14 DIAGNOSIS — S2232XD Fracture of one rib, left side, subsequent encounter for fracture with routine healing: Secondary | ICD-10-CM | POA: Diagnosis not present

## 2024-07-14 DIAGNOSIS — S22058D Other fracture of T5-T6 vertebra, subsequent encounter for fracture with routine healing: Secondary | ICD-10-CM | POA: Diagnosis not present

## 2024-07-14 DIAGNOSIS — I5042 Chronic combined systolic (congestive) and diastolic (congestive) heart failure: Secondary | ICD-10-CM | POA: Diagnosis not present

## 2024-07-14 DIAGNOSIS — E1122 Type 2 diabetes mellitus with diabetic chronic kidney disease: Secondary | ICD-10-CM | POA: Diagnosis not present

## 2024-07-14 DIAGNOSIS — L89153 Pressure ulcer of sacral region, stage 3: Secondary | ICD-10-CM | POA: Diagnosis not present

## 2024-07-15 ENCOUNTER — Inpatient Hospital Stay: Attending: Hematology

## 2024-07-15 ENCOUNTER — Inpatient Hospital Stay

## 2024-07-15 VITALS — BP 122/44 | HR 74 | Resp 19

## 2024-07-15 DIAGNOSIS — N184 Chronic kidney disease, stage 4 (severe): Secondary | ICD-10-CM | POA: Diagnosis not present

## 2024-07-15 DIAGNOSIS — D631 Anemia in chronic kidney disease: Secondary | ICD-10-CM | POA: Diagnosis not present

## 2024-07-15 LAB — CBC
HCT: 32 % — ABNORMAL LOW (ref 36.0–46.0)
Hemoglobin: 10.3 g/dL — ABNORMAL LOW (ref 12.0–15.0)
MCH: 28.9 pg (ref 26.0–34.0)
MCHC: 32.2 g/dL (ref 30.0–36.0)
MCV: 89.6 fL (ref 80.0–100.0)
Platelets: 316 K/uL (ref 150–400)
RBC: 3.57 MIL/uL — ABNORMAL LOW (ref 3.87–5.11)
RDW: 18.4 % — ABNORMAL HIGH (ref 11.5–15.5)
WBC: 9 K/uL (ref 4.0–10.5)
nRBC: 0 % (ref 0.0–0.2)

## 2024-07-15 MED ORDER — EPOETIN ALFA-EPBX 20000 UNIT/ML IJ SOLN
20000.0000 [IU] | Freq: Once | INTRAMUSCULAR | Status: AC
  Administered 2024-07-15: 20000 [IU] via SUBCUTANEOUS
  Filled 2024-07-15: qty 1

## 2024-07-15 NOTE — Telephone Encounter (Signed)
 Left voicemail to call back.

## 2024-07-15 NOTE — Progress Notes (Signed)
 Patient's Hgb 10.3 and blood pressure stable. Patient tolerated Retacrit injection with no complaints voiced.  Site clean and dry with no bruising or swelling noted at site.  See MAR for details.  Band aid applied.  Patient stable during and after injection.  Vss with discharge and left in satisfactory condition with no s/s of distress noted. All follow ups as scheduled.   Pranshu Lyster [employer]

## 2024-07-15 NOTE — Patient Instructions (Signed)

## 2024-07-16 DIAGNOSIS — L89153 Pressure ulcer of sacral region, stage 3: Secondary | ICD-10-CM | POA: Diagnosis not present

## 2024-07-16 DIAGNOSIS — S32592D Other specified fracture of left pubis, subsequent encounter for fracture with routine healing: Secondary | ICD-10-CM | POA: Diagnosis not present

## 2024-07-16 DIAGNOSIS — E1122 Type 2 diabetes mellitus with diabetic chronic kidney disease: Secondary | ICD-10-CM | POA: Diagnosis not present

## 2024-07-16 DIAGNOSIS — S22058D Other fracture of T5-T6 vertebra, subsequent encounter for fracture with routine healing: Secondary | ICD-10-CM | POA: Diagnosis not present

## 2024-07-16 DIAGNOSIS — E1165 Type 2 diabetes mellitus with hyperglycemia: Secondary | ICD-10-CM | POA: Diagnosis not present

## 2024-07-16 DIAGNOSIS — S2232XD Fracture of one rib, left side, subsequent encounter for fracture with routine healing: Secondary | ICD-10-CM | POA: Diagnosis not present

## 2024-07-16 DIAGNOSIS — I5042 Chronic combined systolic (congestive) and diastolic (congestive) heart failure: Secondary | ICD-10-CM | POA: Diagnosis not present

## 2024-07-16 DIAGNOSIS — I152 Hypertension secondary to endocrine disorders: Secondary | ICD-10-CM | POA: Diagnosis not present

## 2024-07-16 DIAGNOSIS — E1159 Type 2 diabetes mellitus with other circulatory complications: Secondary | ICD-10-CM | POA: Diagnosis not present

## 2024-07-16 NOTE — Telephone Encounter (Signed)
 Contacted [facility] in [place] and gave verbal orders for occupational therapy 1week7.

## 2024-07-16 NOTE — Telephone Encounter (Signed)
 Contacted [facility] office in [place]. I provided verbal orders for wound care w/ silver collagen.

## 2024-07-18 ENCOUNTER — Other Ambulatory Visit: Payer: Self-pay | Admitting: Oncology

## 2024-07-18 ENCOUNTER — Other Ambulatory Visit: Payer: Self-pay | Admitting: *Deleted

## 2024-07-18 DIAGNOSIS — K219 Gastro-esophageal reflux disease without esophagitis: Secondary | ICD-10-CM

## 2024-07-19 ENCOUNTER — Encounter: Payer: Self-pay | Admitting: Oncology

## 2024-07-19 DIAGNOSIS — L89153 Pressure ulcer of sacral region, stage 3: Secondary | ICD-10-CM | POA: Diagnosis not present

## 2024-07-21 DIAGNOSIS — E1159 Type 2 diabetes mellitus with other circulatory complications: Secondary | ICD-10-CM | POA: Diagnosis not present

## 2024-07-21 DIAGNOSIS — L89153 Pressure ulcer of sacral region, stage 3: Secondary | ICD-10-CM | POA: Diagnosis not present

## 2024-07-21 DIAGNOSIS — S2232XD Fracture of one rib, left side, subsequent encounter for fracture with routine healing: Secondary | ICD-10-CM | POA: Diagnosis not present

## 2024-07-21 DIAGNOSIS — I5042 Chronic combined systolic (congestive) and diastolic (congestive) heart failure: Secondary | ICD-10-CM | POA: Diagnosis not present

## 2024-07-21 DIAGNOSIS — E1122 Type 2 diabetes mellitus with diabetic chronic kidney disease: Secondary | ICD-10-CM | POA: Diagnosis not present

## 2024-07-21 DIAGNOSIS — S32592D Other specified fracture of left pubis, subsequent encounter for fracture with routine healing: Secondary | ICD-10-CM | POA: Diagnosis not present

## 2024-07-21 DIAGNOSIS — S22058D Other fracture of T5-T6 vertebra, subsequent encounter for fracture with routine healing: Secondary | ICD-10-CM | POA: Diagnosis not present

## 2024-07-21 DIAGNOSIS — E1165 Type 2 diabetes mellitus with hyperglycemia: Secondary | ICD-10-CM | POA: Diagnosis not present

## 2024-07-21 DIAGNOSIS — I152 Hypertension secondary to endocrine disorders: Secondary | ICD-10-CM | POA: Diagnosis not present

## 2024-07-26 ENCOUNTER — Other Ambulatory Visit: Payer: Self-pay | Admitting: Family Medicine

## 2024-07-26 ENCOUNTER — Telehealth: Payer: Self-pay

## 2024-07-26 DIAGNOSIS — E1159 Type 2 diabetes mellitus with other circulatory complications: Secondary | ICD-10-CM | POA: Diagnosis not present

## 2024-07-26 DIAGNOSIS — S2232XD Fracture of one rib, left side, subsequent encounter for fracture with routine healing: Secondary | ICD-10-CM | POA: Diagnosis not present

## 2024-07-26 DIAGNOSIS — L89153 Pressure ulcer of sacral region, stage 3: Secondary | ICD-10-CM | POA: Diagnosis not present

## 2024-07-26 DIAGNOSIS — E1165 Type 2 diabetes mellitus with hyperglycemia: Secondary | ICD-10-CM | POA: Diagnosis not present

## 2024-07-26 DIAGNOSIS — I5042 Chronic combined systolic (congestive) and diastolic (congestive) heart failure: Secondary | ICD-10-CM | POA: Diagnosis not present

## 2024-07-26 DIAGNOSIS — S32592D Other specified fracture of left pubis, subsequent encounter for fracture with routine healing: Secondary | ICD-10-CM | POA: Diagnosis not present

## 2024-07-26 DIAGNOSIS — E1122 Type 2 diabetes mellitus with diabetic chronic kidney disease: Secondary | ICD-10-CM | POA: Diagnosis not present

## 2024-07-26 DIAGNOSIS — I152 Hypertension secondary to endocrine disorders: Secondary | ICD-10-CM | POA: Diagnosis not present

## 2024-07-26 DIAGNOSIS — S22058D Other fracture of T5-T6 vertebra, subsequent encounter for fracture with routine healing: Secondary | ICD-10-CM | POA: Diagnosis not present

## 2024-07-26 NOTE — Telephone Encounter (Signed)
 Copied from CRM #[id number]. Topic: Clinical - Home Health Verbal Orders >> Jul 26, 2024  1:53 PM Rosaria BRAVO wrote: Caller/Agency: [facility]  Callback Number: [phone] Service Requested: Skilled Nursing  Wants to know if they can use calcium  alginate for her wound care   Also wants to know about the pt's protein supplement.

## 2024-07-26 NOTE — Telephone Encounter (Signed)
 Copied from CRM #[id number]. Topic: Clinical - Medication Refill >> Jul 26, 2024  1:56 PM Rosaria E wrote: Medication:  traMADol  (ULTRAM ) 50 MG tablet   Has the patient contacted their pharmacy? Yes (Agent: If no, request that the patient contact the pharmacy for the refill. If patient does not wish to contact the pharmacy document the reason why and proceed with request.) (Agent: If yes, when and what did the pharmacy advise?)  This is the patient's preferred pharmacy:  [id number] [place] [place] - [address] [place] [address] Phone: [phone] Fax: [phone]  Is this the correct pharmacy for this prescription? Yes If no, delete pharmacy and type the correct one.   Has the prescription been filled recently? Yes  Is the patient out of the medication? No.   Has the patient been seen for an appointment in the last year OR does the patient have an upcoming appointment? Yes  Can we respond through MyChart? Yes  Agent: Please be advised that Rx refills may take up to 3 business days. We ask that you follow-up with your pharmacy.

## 2024-07-27 NOTE — Telephone Encounter (Signed)
 Returned call to [facility]- NA When they call back - review what provider said

## 2024-07-27 NOTE — Telephone Encounter (Signed)
 Called [facility] office and gave verbal orders over the phone for wound care.

## 2024-07-28 ENCOUNTER — Encounter

## 2024-07-28 ENCOUNTER — Other Ambulatory Visit: Payer: Self-pay

## 2024-07-28 DIAGNOSIS — M818 Other osteoporosis without current pathological fracture: Secondary | ICD-10-CM

## 2024-07-28 DIAGNOSIS — D631 Anemia in chronic kidney disease: Secondary | ICD-10-CM

## 2024-07-29 ENCOUNTER — Telehealth: Payer: Self-pay | Admitting: Family Medicine

## 2024-07-29 ENCOUNTER — Inpatient Hospital Stay

## 2024-07-29 DIAGNOSIS — D631 Anemia in chronic kidney disease: Secondary | ICD-10-CM

## 2024-07-29 DIAGNOSIS — N184 Chronic kidney disease, stage 4 (severe): Secondary | ICD-10-CM | POA: Diagnosis not present

## 2024-07-29 LAB — CBC WITH DIFFERENTIAL/PLATELET
Abs Immature Granulocytes: 0.08 K/uL — ABNORMAL HIGH (ref 0.00–0.07)
Basophils Absolute: 0 K/uL (ref 0.0–0.1)
Basophils Relative: 0 %
Eosinophils Absolute: 0.1 K/uL (ref 0.0–0.5)
Eosinophils Relative: 0 %
HCT: 33.2 % — ABNORMAL LOW (ref 36.0–46.0)
Hemoglobin: 11.1 g/dL — ABNORMAL LOW (ref 12.0–15.0)
Immature Granulocytes: 1 %
Lymphocytes Relative: 9 %
Lymphs Abs: 1.2 K/uL (ref 0.7–4.0)
MCH: 29.2 pg (ref 26.0–34.0)
MCHC: 33.4 g/dL (ref 30.0–36.0)
MCV: 87.4 fL (ref 80.0–100.0)
Monocytes Absolute: 1.1 K/uL — ABNORMAL HIGH (ref 0.1–1.0)
Monocytes Relative: 8 %
Neutro Abs: 11.8 K/uL — ABNORMAL HIGH (ref 1.7–7.7)
Neutrophils Relative %: 82 %
Platelets: 319 K/uL (ref 150–400)
RBC: 3.8 MIL/uL — ABNORMAL LOW (ref 3.87–5.11)
RDW: 18.6 % — ABNORMAL HIGH (ref 11.5–15.5)
WBC: 14.3 K/uL — ABNORMAL HIGH (ref 4.0–10.5)
nRBC: 0 % (ref 0.0–0.2)

## 2024-07-29 NOTE — Telephone Encounter (Signed)
 Copied from CRM #[id number]. Topic: Clinical - Medication Refill >> Jul 26, 2024  1:56 PM Rosaria E wrote: Medication:  traMADol  (ULTRAM ) 50 MG tablet   Has the patient contacted their pharmacy? Yes (Agent: If no, request that the patient contact the pharmacy for the refill. If patient does not wish to contact the pharmacy document the reason why and proceed with request.) (Agent: If yes, when and what did the pharmacy advise?)  This is the patient's preferred pharmacy:  [id number] [place] [place] - [address] [place] [address] Phone: [phone] Fax: [phone]  Is this the correct pharmacy for this prescription? Yes If no, delete pharmacy and type the correct one.   Has the prescription been filled recently? Yes  Is the patient out of the medication? No.   Has the patient been seen for an appointment in the last year OR does the patient have an upcoming appointment? Yes  Can we respond through MyChart? Yes  Agent: Please be advised that Rx refills may take up to 3 business days. We ask that you follow-up with your pharmacy. >> Jul 29, 2024 10:22 AM Zebedee SAUNDERS wrote: Pt was seen on 06/29/2024 by Rock Bruns NP. Pt needs medication refilled.  >> Jul 29, 2024  9:55 AM Emylou G wrote: Adv needs appt

## 2024-07-29 NOTE — Telephone Encounter (Signed)
 Pt already aware refill has to be done during a visit, appt was already made for 07/30/24

## 2024-07-29 NOTE — Progress Notes (Signed)
 No injection needed today patient made aware and discharged in satisfactory condition.

## 2024-07-30 ENCOUNTER — Telehealth: Payer: Self-pay | Admitting: Family Medicine

## 2024-07-30 ENCOUNTER — Encounter: Payer: Self-pay | Admitting: Family Medicine

## 2024-07-30 ENCOUNTER — Ambulatory Visit: Admitting: Family Medicine

## 2024-07-30 VITALS — BP 100/54 | HR 92 | Temp 97.0°F

## 2024-07-30 DIAGNOSIS — E1065 Type 1 diabetes mellitus with hyperglycemia: Secondary | ICD-10-CM

## 2024-07-30 DIAGNOSIS — E1069 Type 1 diabetes mellitus with other specified complication: Secondary | ICD-10-CM

## 2024-07-30 DIAGNOSIS — I252 Old myocardial infarction: Secondary | ICD-10-CM | POA: Diagnosis not present

## 2024-07-30 DIAGNOSIS — I2510 Atherosclerotic heart disease of native coronary artery without angina pectoris: Secondary | ICD-10-CM

## 2024-07-30 DIAGNOSIS — E785 Hyperlipidemia, unspecified: Secondary | ICD-10-CM

## 2024-07-30 DIAGNOSIS — L89153 Pressure ulcer of sacral region, stage 3: Secondary | ICD-10-CM

## 2024-07-30 DIAGNOSIS — M545 Low back pain, unspecified: Secondary | ICD-10-CM

## 2024-07-30 DIAGNOSIS — F339 Major depressive disorder, recurrent, unspecified: Secondary | ICD-10-CM | POA: Diagnosis not present

## 2024-07-30 DIAGNOSIS — G8929 Other chronic pain: Secondary | ICD-10-CM

## 2024-07-30 DIAGNOSIS — R34 Anuria and oliguria: Secondary | ICD-10-CM | POA: Diagnosis not present

## 2024-07-30 LAB — URINALYSIS, ROUTINE W REFLEX MICROSCOPIC
Bilirubin, UA: NEGATIVE
Nitrite, UA: NEGATIVE
Specific Gravity, UA: 1.01 (ref 1.005–1.030)
Urobilinogen, Ur: 0.2 mg/dL (ref 0.2–1.0)
pH, UA: >9 — ABNORMAL HIGH (ref 5.0–7.5)

## 2024-07-30 LAB — MICROSCOPIC EXAMINATION
Epithelial Cells (non renal): NONE SEEN /HPF (ref 0–10)
Renal Epithel, UA: NONE SEEN /HPF
Yeast, UA: NONE SEEN

## 2024-07-30 MED ORDER — CEPHALEXIN 500 MG PO CAPS: 500.0000 mg | ORAL_CAPSULE | Freq: Three times a day (TID) | ORAL | 0 refills | Status: DC

## 2024-07-30 MED ORDER — BUPROPION HCL ER (XL) 150 MG PO TB24: 150.0000 mg | ORAL_TABLET | Freq: Two times a day (BID) | ORAL | 1 refills | Status: AC

## 2024-07-30 MED ORDER — TRAMADOL HCL 50 MG PO TABS: 50.0000 mg | ORAL_TABLET | Freq: Two times a day (BID) | ORAL | 0 refills | Status: DC | PRN

## 2024-07-30 MED ORDER — ROSUVASTATIN CALCIUM 40 MG PO TABS: 40.0000 mg | ORAL_TABLET | Freq: Every day | ORAL | 1 refills | Status: AC

## 2024-07-30 NOTE — Progress Notes (Signed)
 Subjective:  Patient ID: Bridget Martinez, female    DOB: [birth year], 73 y.o.   MRN: [id number]  Patient Care Team: Severa [name], FNP as PCP - General (Family Medicine) [name], Toribio SAUNDERS, MD (Cardiology) Faythe Purchase, MD as Attending Physician (Internal Medicine) Rachele Gaynell RAMAN, MD as Consulting Physician (Nephrology) Alvia Norleen BIRCH, MD as Consulting Physician (Ophthalmology)   Chief Complaint:  Pain (Back pain ) and Anorexia (X 2-3 weeks )   HPI: Bridget Martinez is a 73 y.o. female presenting on 07/30/2024 for Pain (Back pain ) and Anorexia (X 2-3 weeks )    Bridget Martinez is a 73 year old female who presents with worsening back pain and decreased appetite.  She has been experiencing significant worsening back pain, primarily located in the lower back. She has not taken her pain medication, tramadol , for four to five days, which she previously took twice daily at a dose of 50 mg. The pain is severe enough to affect her appetite, as she states 'I'm not hungry' and 'I hurt so bad.'  She has a sacral wound that has been increasing in size, described as more shallow but starting to break around the edges, located right above her tailbone. The caregiver has been packing the wound daily when the nurse is not available, using silver alginate recently due to an initial odor from the wound.  Her appetite has decreased, and she has not wanted to eat, which is concerning to her caregiver. She has been consuming high-protein supplements like Ensure and yogurt to maintain muscle mass, but her intake is limited. Her caregiver is cautious about her nutrition due to her blood sugar levels running high.  There is a concern for a possible urinary tract infection (UTI) as she has been slightly incontinent at night and wears protective pants. Her caregiver notes that she does not typically show symptoms of a UTI other than odor. She had a previous UTI treated with a single dose of Monurol  while in a nursing home.  Her recent blood work showed an elevated white blood cell count of 14,000. Her hemoglobin levels were noted to be good. She has a history of allergies to penicillin, sulfasipram, and Macrobid .  She has been experiencing low normal blood pressure, which is unusual for her, as she typically has a blood pressure of 120/80 mmHg. Her current blood pressure is 100/50 mmHg, which is lower than her usual blood pressure of 120/80 mmHg.          Relevant past medical, surgical, family, and social history reviewed and updated as indicated.  Allergies and medications reviewed and updated. Data reviewed: Chart in Epic.   Past Medical History:  Diagnosis Date   Arthritis    CHF (congestive heart failure) (HCC)    Coronary artery disease    angina.  MI.    Depression    anxiety   Diabetes mellitus    on insulin , with h/o DKA    GERD (gastroesophageal reflux disease)    Hiatal hernia.    HTN (hypertension)    Hyperlipidemia    Hypothyroidism    Left displaced femoral neck fracture (HCC) 11/19/2014   Myocardial infarction [facility])    NSTEMI 07/2011 with cardiogenic shock, s/p CABG 09/2011   Osteoporosis    Pneumonia        Tuberculosis    Venous stasis ulcers (HCC)     Past Surgical History:  Procedure Laterality Date   CARDIAC CATHETERIZATION  COLONOSCOPY N/A 11/18/2013   Procedure: COLONOSCOPY;  Surgeon: Princella CHRISTELLA Nida, MD;  Location: [facility] ENDOSCOPY;  Service: Endoscopy;  Laterality: N/A;   CORONARY ARTERY BYPASS GRAFT     Dr. Fleeta Trigt in 09/2011    CORONARY ARTERY BYPASS GRAFT  2012   ESOPHAGOGASTRODUODENOSCOPY N/A 11/17/2013   Procedure: ESOPHAGOGASTRODUODENOSCOPY (EGD);  Surgeon: Princella CHRISTELLA Nida, MD;  Location: [facility] ENDOSCOPY;  Service: Endoscopy;  Laterality: N/A;   LEFT HEART CATHETERIZATION WITH CORONARY ANGIOGRAM N/A 07/26/2011   Procedure: LEFT HEART CATHETERIZATION WITH CORONARY ANGIOGRAM;  Surgeon: Ezra GORMAN Shuck, MD;  Location: [facility] CATH LAB;  Service:  Cardiovascular;  Laterality: N/A;   RIGHT HEART CATHETERIZATION N/A 07/29/2011   Procedure: RIGHT HEART CATH;  Surgeon: Lynwood Schilling, MD;  Location: [facility] CATH LAB;  Service: Cardiovascular;  Laterality: N/A;   TONSILLECTOMY     TOTAL HIP ARTHROPLASTY Left 11/21/2014   Procedure: TOTAL HIP ARTHROPLASTY ANTERIOR APPROACH;  Surgeon: Lonni CINDERELLA Poli, MD;  Location: [facility] OR;  Service: Orthopedics;  Laterality: Left;    Social History   Socioeconomic History   Marital status: Widowed    Spouse name: Abby; Son Zell   Number of children: 2   Years of education: 18   Highest education level: Bachelor's degree (e.g., BA, AB, BS)  Occupational History   Occupation: Retired  Tobacco Use   Smoking status: Never   Smokeless tobacco: Never  Vaping Use   Vaping status: Never Used  Substance and Sexual Activity   Alcohol use: No   Drug use: No   Sexual activity: Not on file  Other Topics Concern   Not on file  Social History Narrative   Lives alone, suffers from depression since losing her husband. Son, Zell takes her to appointments and helps her a lot.   Social Drivers of Corporate Investment Banker Strain: Low Risk  (04/07/2024)   Overall Financial Resource Strain (CARDIA)    Difficulty of Paying Living Expenses: Not hard at all  Food Insecurity: No Food Insecurity (05/04/2024)   Hunger Vital Sign    Worried About Running Out of Food in the Last Year: Never true    Ran Out of Food in the Last Year: Never true  Transportation Needs: No Transportation Needs (05/04/2024)   PRAPARE - Administrator, Civil Service (Medical): No    Lack of Transportation (Non-Medical): No  Physical Activity: Insufficiently Active (04/07/2024)   Exercise Vital Sign    Days of Exercise per Week: 3 days    Minutes of Exercise per Session: 30 min  Stress: No Stress Concern Present (04/07/2024)   [employer] of Occupational Health - Occupational Stress Questionnaire    Feeling of Stress:  Not at all  Social Connections: Socially Isolated (05/04/2024)   Social Connection and Isolation Panel    Frequency of Communication with Friends and Family: More than three times a week    Frequency of Social Gatherings with Friends and Family: More than three times a week    Attends Religious Services: Never    Database Administrator or Organizations: No    Attends Banker Meetings: Never    Marital Status: Widowed  Intimate Partner Violence: Not At Risk (05/04/2024)   Humiliation, Afraid, Rape, and Kick questionnaire    Fear of Current or Ex-Partner: No    Emotionally Abused: No    Physically Abused: No    Sexually Abused: No    Outpatient Encounter Medications as of 07/30/2024  Medication Sig  aspirin  81 MG EC tablet Take 1 tablet (81 mg total) by mouth daily.   cephALEXin  (KEFLEX ) 500 MG capsule Take 1 capsule (500 mg total) by mouth 3 (three) times daily for 7 days.   Continuous Blood Gluc Receiver (DEXCOM G6 RECEIVER) DEVI See admin instructions.   Continuous Blood Gluc Sensor (DEXCOM G6 SENSOR) MISC See admin instructions.   epoetin  alfa (EPOGEN ) 3000 UNIT/ML injection Inject into the skin.   ferrous sulfate  325 (65 FE) MG EC tablet TAKE 1 TABLET BY MOUTH EVERY OTHER DAY   furosemide  (LASIX ) 20 MG tablet Take 20 mg by mouth once a week.   insulin  aspart (NOVOLOG  FLEXPEN) 100 UNIT/ML FlexPen Inject 9-16 Units into the skin 3 (three) times daily with meals. Take 9 units at breakfast, 16 units at lunch, 14 units at bedtime   LANTUS  SOLOSTAR 100 UNIT/ML Solostar Pen Inject 12 Units into the skin at bedtime.   levothyroxine  (SYNTHROID ) 75 MCG tablet Take 75 mcg by mouth daily before breakfast.   metoCLOPramide  (REGLAN ) 5 MG tablet TAKE 1 TABLET BY MOUTH 3 TIMES DAILY BEFORE MEALS.   ondansetron  (ZOFRAN ) 4 MG tablet Take 4 mg by mouth every 8 (eight) hours as needed.   pantoprazole  (PROTONIX ) 40 MG tablet TAKE 1 TABLET BY MOUTH EVERY DAY   sodium bicarbonate  650 MG  tablet Take 650 mg by mouth 3 (three) times daily.   [DISCONTINUED] traMADol  (ULTRAM ) 50 MG tablet Take 1 tablet (50 mg total) by mouth every 6 (six) hours as needed for moderate pain (pain score 4-6).   buPROPion  (WELLBUTRIN  XL) 150 MG 24 hr tablet Take 1 tablet (150 mg total) by mouth 2 (two) times daily.   carvedilol  (COREG ) 12.5 MG tablet Take 1 tablet (12.5 mg total) by mouth 2 (two) times daily with a meal. PLEASE SCHEDULE APPOINTMENT FOR MORE REFILLS (Patient not taking: Reported on 07/30/2024)   rosuvastatin  (CRESTOR ) 40 MG tablet Take 1 tablet (40 mg total) by mouth daily.   traMADol  (ULTRAM ) 50 MG tablet Take 1 tablet (50 mg total) by mouth 2 (two) times daily as needed for moderate pain (pain score 4-6).   [DISCONTINUED] buPROPion  (WELLBUTRIN  XL) 150 MG 24 hr tablet TAKE 1 TABLET BY MOUTH TWICE A DAY   [DISCONTINUED] mirtazapine  (REMERON ) 15 MG tablet Take 1 tablet (15 mg total) by mouth at bedtime.   [DISCONTINUED] rosuvastatin  (CRESTOR ) 40 MG tablet TAKE 1 TABLET BY MOUTH EVERY DAY   No facility-administered encounter medications on file as of 07/30/2024.    Allergies  Allergen Reactions   Penicillins Anaphylaxis and Hives    Tolerates zosyn  Has patient had a PCN reaction causing immediate rash, facial/tongue/throat swelling, SOB or lightheadedness with hypotension: Yes Has patient had a PCN reaction causing severe rash involving mucus membranes or skin necrosis: No Has patient had a PCN reaction that required hospitalization: Yes Has patient had a PCN reaction occurring within the last 10 years: No If all of the above answers are NO, then may proceed with Cephalosporin use.    Sulfa Antibiotics Anaphylaxis, Other (See Comments) and Swelling    Stiff neck also   Ciprofloxacin  Other (See Comments)   Macrobid  [Nitrofurantoin  Monohyd Macro] Other (See Comments)    Skin peels off   Other Other (See Comments)   Ciprofloxacin  Hcl Other (See Comments)    Skin peeling     Pertinent ROS per HPI, otherwise unremarkable      Objective:  BP (!) 100/54   Pulse 92   Temp (!)  97 F (36.1 C)   SpO2 100%    Wt Readings from Last 3 Encounters:  07/01/24 133 lb 9.6 oz (60.6 kg)  05/05/24 132 lb 15 oz (60.3 kg)  04/07/24 135 lb (61.2 kg)    Physical Exam Vitals and nursing note reviewed.  Constitutional:      General: She is in acute distress.     Appearance: She is ill-appearing (chronically ill). She is not toxic-appearing or diaphoretic.     Comments: Deconditioned  HENT:     Head: Normocephalic and atraumatic.     Mouth/Throat:     Mouth: Mucous membranes are dry.  Eyes:     Pupils: Pupils are equal, round, and reactive to light.  Cardiovascular:     Rate and Rhythm: Normal rate.     Heart sounds: Murmur heard.     Systolic murmur is present with a grade of 2/6.  Musculoskeletal:     Cervical back: Normal and neck supple.     Thoracic back: Decreased range of motion.     Lumbar back: Decreased range of motion.     Right lower leg: Edema present.     Left lower leg: Edema present.  Skin:    General: Skin is warm.     Capillary Refill: Capillary refill takes less than 2 seconds.  Neurological:     General: No focal deficit present.     Mental Status: She is alert and oriented to person, place, and time.     Motor: Weakness (generalized) present.     Gait: Gait abnormal (in wheelchair).      Results for orders placed or performed in visit on 07/29/24  CBC with Differential/Platelet   Collection Time: 07/29/24  1:19 PM  Result Value Ref Range   WBC 14.3 (H) 4.0 - 10.5 K/uL   RBC 3.80 (L) 3.87 - 5.11 MIL/uL   Hemoglobin 11.1 (L) 12.0 - 15.0 g/dL   HCT 66.7 (L) 63.9 - 53.9 %   MCV 87.4 80.0 - 100.0 fL   MCH 29.2 26.0 - 34.0 pg   MCHC 33.4 30.0 - 36.0 g/dL   RDW 81.3 (H) 88.4 - 84.4 %   Platelets 319 150 - 400 K/uL   nRBC 0.0 0.0 - 0.2 %   Neutrophils Relative % 82 %   Neutro Abs 11.8 (H) 1.7 - 7.7 K/uL   Lymphocytes Relative  9 %   Lymphs Abs 1.2 0.7 - 4.0 K/uL   Monocytes Relative 8 %   Monocytes Absolute 1.1 (H) 0.1 - 1.0 K/uL   Eosinophils Relative 0 %   Eosinophils Absolute 0.1 0.0 - 0.5 K/uL   Basophils Relative 0 %   Basophils Absolute 0.0 0.0 - 0.1 K/uL   Immature Granulocytes 1 %   Abs Immature Granulocytes 0.08 (H) 0.00 - 0.07 K/uL       Pertinent labs & imaging results that were available during my care of the patient were reviewed by me and considered in my medical decision making.  Assessment & Plan:  Bridget Martinez was seen today for pain and anorexia.  Diagnoses and all orders for this visit:  Pressure injury of sacral region, stage 3 (HCC) -     cephALEXin  (KEFLEX ) 500 MG capsule; Take 1 capsule (500 mg total) by mouth 3 (three) times daily for 7 days.  Recurrent depression -     buPROPion  (WELLBUTRIN  XL) 150 MG 24 hr tablet; Take 1 tablet (150 mg total) by mouth 2 (two) times daily.  Hyperlipidemia  due to type 1 diabetes mellitus (HCC) -     rosuvastatin  (CRESTOR ) 40 MG tablet; Take 1 tablet (40 mg total) by mouth daily.  CAD, multiple vessel -     rosuvastatin  (CRESTOR ) 40 MG tablet; Take 1 tablet (40 mg total) by mouth daily.  History of non-ST elevation myocardial infarction (NSTEMI) -     rosuvastatin  (CRESTOR ) 40 MG tablet; Take 1 tablet (40 mg total) by mouth daily.  Decreased urination -     Urinalysis, Routine w reflex microscopic -     Urine Culture  Chronic bilateral low back pain without sciatica -     traMADol  (ULTRAM ) 50 MG tablet; Take 1 tablet (50 mg total) by mouth 2 (two) times daily as needed for moderate pain (pain score 4-6).  Type 1 diabetes mellitus with hyperglycemia (HCC) -     Microalbumin / creatinine urine ratio      Pressure ulcer of sacral region, stage 3 Stage 3 pressure ulcer in the sacral region, increasing in size but more shallow, with surrounding skin breakdown. Concern for infection due to elevated white blood cell count. - Prescribed Keflex   three times a day for seven days to cover potential infection.  Possible urinary tract infection Suspected UTI due to elevated white blood cell count and incontinence. No typical UTI symptoms reported, but incontinence and odor suggest possible infection. - Prescribed Keflex  to cover potential UTI. - Attempt to obtain urine sample for further evaluation.  Low back pain Chronic low back pain exacerbated by lack of pain medication. Pain management is crucial to improve appetite and overall well-being. - Prescribed tramadol  for pain management. - Monitor response to tramadol  and adjust treatment if necessary.  Anorexia and oliguria Decreased appetite and reduced oral intake, likely secondary to pain and possible infection. Low blood pressure indicates possible dehydration. - Encouraged high-protein intake and use of nutritional supplements like Ensure or Boost. - Monitor hydration status and encourage fluid intake. - Will consider hospital admission for IV nutrition if oral intake does not improve.  Type 1 diabetes mellitus with hyperglycemia Hyperglycemia likely exacerbated by infection and reduced oral intake. Blood sugar management is critical to prevent further complications. - Monitor blood glucose levels closely.          Continue all other maintenance medications.  Follow up plan: Return if symptoms worsen or fail to improve.   Continue healthy lifestyle choices, including diet (rich in fruits, vegetables, and lean proteins, and low in salt and simple carbohydrates) and exercise (at least 30 minutes of moderate physical activity daily).    The above assessment and management plan was discussed with the patient. The patient verbalized understanding of and has agreed to the management plan. Patient is aware to call the clinic if they develop any new symptoms or if symptoms persist or worsen. Patient is aware when to return to the clinic for a follow-up visit. Patient educated on  when it is appropriate to go to the emergency department.   Rosaline Bruns, FNP-C [facility] [phone]

## 2024-07-30 NOTE — Telephone Encounter (Signed)
 Copied from CRM [id number]. Topic: Clinical - Medication Question >> Jul 30, 2024  2:31 PM Sophia H wrote: Reason for CRM: Just needed to let pcp know that patient has a rash on her groin area - wanting to know if they can get nystatin  powder for the patient. Please advise # [phone]

## 2024-07-30 NOTE — Telephone Encounter (Signed)
Lmtcb ? ?Need more information  ?

## 2024-07-31 LAB — MICROALBUMIN / CREATININE URINE RATIO
Creatinine, Urine: 51.2 mg/dL
Microalb/Creat Ratio: 891 mg/g{creat} — ABNORMAL HIGH (ref 0–29)
Microalbumin, Urine: 456.4 ug/mL

## 2024-08-01 ENCOUNTER — Ambulatory Visit: Payer: Self-pay | Admitting: Family Medicine

## 2024-08-02 ENCOUNTER — Encounter (HOSPITAL_COMMUNITY): Payer: Self-pay

## 2024-08-02 ENCOUNTER — Inpatient Hospital Stay (HOSPITAL_COMMUNITY)
Admission: EM | Admit: 2024-08-02 | Discharge: 2024-08-07 | DRG: 689 | Disposition: A | Discharge status: Skilled Nursing Facility | Attending: Internal Medicine | Admitting: Internal Medicine

## 2024-08-02 ENCOUNTER — Emergency Department (HOSPITAL_COMMUNITY)

## 2024-08-02 DIAGNOSIS — M545 Low back pain, unspecified: Secondary | ICD-10-CM

## 2024-08-02 DIAGNOSIS — E785 Hyperlipidemia, unspecified: Secondary | ICD-10-CM | POA: Diagnosis present

## 2024-08-02 DIAGNOSIS — R310 Gross hematuria: Secondary | ICD-10-CM | POA: Diagnosis not present

## 2024-08-02 DIAGNOSIS — E1043 Type 1 diabetes mellitus with diabetic autonomic (poly)neuropathy: Secondary | ICD-10-CM

## 2024-08-02 DIAGNOSIS — M48061 Spinal stenosis, lumbar region without neurogenic claudication: Secondary | ICD-10-CM | POA: Diagnosis not present

## 2024-08-02 DIAGNOSIS — F419 Anxiety disorder, unspecified: Secondary | ICD-10-CM | POA: Diagnosis present

## 2024-08-02 DIAGNOSIS — E1143 Type 2 diabetes mellitus with diabetic autonomic (poly)neuropathy: Secondary | ICD-10-CM | POA: Diagnosis present

## 2024-08-02 DIAGNOSIS — I071 Rheumatic tricuspid insufficiency: Secondary | ICD-10-CM | POA: Diagnosis present

## 2024-08-02 DIAGNOSIS — N189 Chronic kidney disease, unspecified: Principal | ICD-10-CM

## 2024-08-02 DIAGNOSIS — E1122 Type 2 diabetes mellitus with diabetic chronic kidney disease: Secondary | ICD-10-CM | POA: Diagnosis present

## 2024-08-02 DIAGNOSIS — L89154 Pressure ulcer of sacral region, stage 4: Secondary | ICD-10-CM | POA: Diagnosis present

## 2024-08-02 DIAGNOSIS — I878 Other specified disorders of veins: Secondary | ICD-10-CM | POA: Diagnosis present

## 2024-08-02 DIAGNOSIS — M47816 Spondylosis without myelopathy or radiculopathy, lumbar region: Secondary | ICD-10-CM | POA: Diagnosis not present

## 2024-08-02 DIAGNOSIS — I252 Old myocardial infarction: Secondary | ICD-10-CM

## 2024-08-02 DIAGNOSIS — Z882 Allergy status to sulfonamides status: Secondary | ICD-10-CM

## 2024-08-02 DIAGNOSIS — F32A Depression, unspecified: Secondary | ICD-10-CM | POA: Diagnosis present

## 2024-08-02 DIAGNOSIS — I447 Left bundle-branch block, unspecified: Secondary | ICD-10-CM | POA: Diagnosis present

## 2024-08-02 DIAGNOSIS — N179 Acute kidney failure, unspecified: Secondary | ICD-10-CM | POA: Diagnosis present

## 2024-08-02 DIAGNOSIS — I340 Nonrheumatic mitral (valve) insufficiency: Secondary | ICD-10-CM | POA: Diagnosis present

## 2024-08-02 DIAGNOSIS — R630 Anorexia: Secondary | ICD-10-CM | POA: Diagnosis present

## 2024-08-02 DIAGNOSIS — D631 Anemia in chronic kidney disease: Secondary | ICD-10-CM | POA: Diagnosis present

## 2024-08-02 DIAGNOSIS — R319 Hematuria, unspecified: Secondary | ICD-10-CM | POA: Diagnosis present

## 2024-08-02 DIAGNOSIS — I5042 Chronic combined systolic (congestive) and diastolic (congestive) heart failure: Secondary | ICD-10-CM | POA: Diagnosis present

## 2024-08-02 DIAGNOSIS — R54 Age-related physical debility: Secondary | ICD-10-CM | POA: Diagnosis present

## 2024-08-02 DIAGNOSIS — Z96642 Presence of left artificial hip joint: Secondary | ICD-10-CM | POA: Diagnosis present

## 2024-08-02 DIAGNOSIS — Z7982 Long term (current) use of aspirin: Secondary | ICD-10-CM

## 2024-08-02 DIAGNOSIS — I2510 Atherosclerotic heart disease of native coronary artery without angina pectoris: Secondary | ICD-10-CM | POA: Diagnosis present

## 2024-08-02 DIAGNOSIS — B964 Proteus (mirabilis) (morganii) as the cause of diseases classified elsewhere: Secondary | ICD-10-CM | POA: Diagnosis present

## 2024-08-02 DIAGNOSIS — Z888 Allergy status to other drugs, medicaments and biological substances status: Secondary | ICD-10-CM

## 2024-08-02 DIAGNOSIS — N3000 Acute cystitis without hematuria: Secondary | ICD-10-CM | POA: Diagnosis present

## 2024-08-02 DIAGNOSIS — R296 Repeated falls: Secondary | ICD-10-CM | POA: Diagnosis present

## 2024-08-02 DIAGNOSIS — E860 Dehydration: Secondary | ICD-10-CM | POA: Diagnosis present

## 2024-08-02 DIAGNOSIS — N3001 Acute cystitis with hematuria: Secondary | ICD-10-CM | POA: Diagnosis not present

## 2024-08-02 DIAGNOSIS — M549 Dorsalgia, unspecified: Secondary | ICD-10-CM | POA: Diagnosis not present

## 2024-08-02 DIAGNOSIS — E876 Hypokalemia: Secondary | ICD-10-CM | POA: Diagnosis present

## 2024-08-02 DIAGNOSIS — N184 Chronic kidney disease, stage 4 (severe): Secondary | ICD-10-CM | POA: Diagnosis present

## 2024-08-02 DIAGNOSIS — R64 Cachexia: Secondary | ICD-10-CM | POA: Diagnosis present

## 2024-08-02 DIAGNOSIS — Z951 Presence of aortocoronary bypass graft: Secondary | ICD-10-CM

## 2024-08-02 DIAGNOSIS — E872 Acidosis, unspecified: Secondary | ICD-10-CM | POA: Diagnosis present

## 2024-08-02 DIAGNOSIS — E871 Hypo-osmolality and hyponatremia: Secondary | ICD-10-CM | POA: Diagnosis present

## 2024-08-02 DIAGNOSIS — M4856XA Collapsed vertebra, not elsewhere classified, lumbar region, initial encounter for fracture: Secondary | ICD-10-CM | POA: Diagnosis present

## 2024-08-02 DIAGNOSIS — Z8249 Family history of ischemic heart disease and other diseases of the circulatory system: Secondary | ICD-10-CM

## 2024-08-02 DIAGNOSIS — E1159 Type 2 diabetes mellitus with other circulatory complications: Secondary | ICD-10-CM | POA: Diagnosis present

## 2024-08-02 DIAGNOSIS — I129 Hypertensive chronic kidney disease with stage 1 through stage 4 chronic kidney disease, or unspecified chronic kidney disease: Secondary | ICD-10-CM | POA: Diagnosis not present

## 2024-08-02 DIAGNOSIS — M8580 Other specified disorders of bone density and structure, unspecified site: Secondary | ICD-10-CM | POA: Diagnosis not present

## 2024-08-02 DIAGNOSIS — Z825 Family history of asthma and other chronic lower respiratory diseases: Secondary | ICD-10-CM

## 2024-08-02 DIAGNOSIS — F39 Unspecified mood [affective] disorder: Secondary | ICD-10-CM | POA: Diagnosis present

## 2024-08-02 DIAGNOSIS — I152 Hypertension secondary to endocrine disorders: Secondary | ICD-10-CM | POA: Diagnosis present

## 2024-08-02 DIAGNOSIS — Z820 Family history of epilepsy and other diseases of the nervous system: Secondary | ICD-10-CM

## 2024-08-02 DIAGNOSIS — Z880 Allergy status to penicillin: Secondary | ICD-10-CM

## 2024-08-02 DIAGNOSIS — Z8781 Personal history of (healed) traumatic fracture: Secondary | ICD-10-CM

## 2024-08-02 DIAGNOSIS — K3184 Gastroparesis: Secondary | ICD-10-CM | POA: Diagnosis present

## 2024-08-02 DIAGNOSIS — Z833 Family history of diabetes mellitus: Secondary | ICD-10-CM

## 2024-08-02 DIAGNOSIS — K219 Gastro-esophageal reflux disease without esophagitis: Secondary | ICD-10-CM | POA: Diagnosis present

## 2024-08-02 DIAGNOSIS — Z79899 Other long term (current) drug therapy: Secondary | ICD-10-CM

## 2024-08-02 DIAGNOSIS — M810 Age-related osteoporosis without current pathological fracture: Secondary | ICD-10-CM | POA: Diagnosis present

## 2024-08-02 DIAGNOSIS — Z881 Allergy status to other antibiotic agents status: Secondary | ICD-10-CM

## 2024-08-02 DIAGNOSIS — Z79890 Hormone replacement therapy (postmenopausal): Secondary | ICD-10-CM

## 2024-08-02 DIAGNOSIS — N3289 Other specified disorders of bladder: Secondary | ICD-10-CM | POA: Diagnosis not present

## 2024-08-02 DIAGNOSIS — Z794 Long term (current) use of insulin: Secondary | ICD-10-CM

## 2024-08-02 DIAGNOSIS — E039 Hypothyroidism, unspecified: Secondary | ICD-10-CM | POA: Diagnosis present

## 2024-08-02 DIAGNOSIS — I2489 Other forms of acute ischemic heart disease: Secondary | ICD-10-CM | POA: Diagnosis present

## 2024-08-02 LAB — I-STAT CHEM 8, ED
BUN: 80 mg/dL — ABNORMAL HIGH (ref 8–23)
Calcium, Ion: 1.13 mmol/L — ABNORMAL LOW (ref 1.15–1.40)
Chloride: 96 mmol/L — ABNORMAL LOW (ref 98–111)
Creatinine, Ser: 2 mg/dL — ABNORMAL HIGH (ref 0.44–1.00)
Glucose, Bld: 280 mg/dL — ABNORMAL HIGH (ref 70–99)
HCT: 32 % — ABNORMAL LOW (ref 36.0–46.0)
Hemoglobin: 10.9 g/dL — ABNORMAL LOW (ref 12.0–15.0)
Potassium: 3 mmol/L — ABNORMAL LOW (ref 3.5–5.1)
Sodium: 133 mmol/L — ABNORMAL LOW (ref 135–145)
TCO2: 22 mmol/L (ref 22–32)

## 2024-08-02 LAB — URINALYSIS, ROUTINE W REFLEX MICROSCOPIC

## 2024-08-02 LAB — URINALYSIS, MICROSCOPIC (REFLEX)
RBC / HPF: >50 RBC/hpf (ref 0–5)
WBC, UA: >50 WBC/hpf (ref 0–5)

## 2024-08-02 LAB — COMPREHENSIVE METABOLIC PANEL WITH GFR
ALT: 23 U/L (ref 0–44)
AST: 20 U/L (ref 15–41)
Albumin: 3.3 g/dL — ABNORMAL LOW (ref 3.5–5.0)
Alkaline Phosphatase: 160 U/L — ABNORMAL HIGH (ref 38–126)
Anion gap: 16 — ABNORMAL HIGH (ref 5–15)
BUN: 89 mg/dL — ABNORMAL HIGH (ref 8–23)
CO2: 25 mmol/L (ref 22–32)
Calcium: 9.5 mg/dL (ref 8.9–10.3)
Chloride: 92 mmol/L — ABNORMAL LOW (ref 98–111)
Creatinine, Ser: 2.08 mg/dL — ABNORMAL HIGH (ref 0.44–1.00)
GFR, Estimated: 25 mL/min — ABNORMAL LOW (ref >60)
Glucose, Bld: 291 mg/dL — ABNORMAL HIGH (ref 70–99)
Potassium: 2.9 mmol/L — ABNORMAL LOW (ref 3.5–5.1)
Sodium: 133 mmol/L — ABNORMAL LOW (ref 135–145)
Total Bilirubin: 0.8 mg/dL (ref 0.0–1.2)
Total Protein: 7.3 g/dL (ref 6.5–8.1)

## 2024-08-02 LAB — CBC
HCT: 29.1 % — ABNORMAL LOW (ref 36.0–46.0)
Hemoglobin: 9.8 g/dL — ABNORMAL LOW (ref 12.0–15.0)
MCH: 29.3 pg (ref 26.0–34.0)
MCHC: 33.7 g/dL (ref 30.0–36.0)
MCV: 87.1 fL (ref 80.0–100.0)
Platelets: 406 K/uL — ABNORMAL HIGH (ref 150–400)
RBC: 3.34 MIL/uL — ABNORMAL LOW (ref 3.87–5.11)
RDW: 18.4 % — ABNORMAL HIGH (ref 11.5–15.5)
WBC: 11.6 K/uL — ABNORMAL HIGH (ref 4.0–10.5)
nRBC: 0.3 % — ABNORMAL HIGH (ref 0.0–0.2)

## 2024-08-02 MED ORDER — INSULIN GLARGINE-YFGN 100 UNIT/ML ~~LOC~~ SOLN
8.0000 [IU] | Freq: Once | SUBCUTANEOUS | Status: AC
  Administered 2024-08-03: 8 [IU] via SUBCUTANEOUS
  Filled 2024-08-02: qty 0.08

## 2024-08-02 MED ORDER — SODIUM CHLORIDE 0.9 % IV BOLUS: 1000.0000 mL | Freq: Once | INTRAVENOUS | Status: DC

## 2024-08-02 MED ORDER — LACTATED RINGERS IV BOLUS
500.0000 mL | Freq: Once | INTRAVENOUS | Status: AC
  Administered 2024-08-02: 500 mL via INTRAVENOUS

## 2024-08-02 MED ORDER — POTASSIUM CHLORIDE CRYS ER 20 MEQ PO TBCR
40.0000 meq | EXTENDED_RELEASE_TABLET | Freq: Once | ORAL | Status: AC
  Administered 2024-08-02: 40 meq via ORAL
  Filled 2024-08-02: qty 2

## 2024-08-02 MED ORDER — CHLORHEXIDINE GLUCONATE CLOTH 2 % EX PADS
6.0000 | MEDICATED_PAD | Freq: Every day | CUTANEOUS | Status: DC
  Administered 2024-08-03: 6 via TOPICAL

## 2024-08-02 MED ORDER — TRAMADOL HCL 50 MG PO TABS
50.0000 mg | ORAL_TABLET | Freq: Once | ORAL | Status: AC
Start: 2024-08-02 — End: 2024-08-02
  Administered 2024-08-02: 50 mg via ORAL
  Filled 2024-08-02: qty 1

## 2024-08-02 NOTE — ED Notes (Signed)
 Patient refuses In and out cath/ pt using bedside commode for urine sample

## 2024-08-02 NOTE — ED Provider Triage Note (Signed)
 Emergency Medicine Provider Triage Evaluation Note  MILKA WINDHOLZ , a 73 y.o. female  was evaluated in triage.  Pt complains of generalized weakness and pain in lumbar spine.  Daughter who is present for visit states that patient has been not eating for the past couple weeks and is worried about failure to thrive.  Patient's daughter also endorses she has a small pressure ulcer that has been well-kept by home health.  Review of Systems  Positive: Weakness Negative: Fever  Physical Exam  BP (!) 123/51 (BP Location: Left Arm)   Pulse 93   Temp 98.2 F (36.8 C)   Resp 16   SpO2 99%  Gen:   Awake, no distress   Resp:  Normal effort  MSK:   Moves extremities without difficulty  Other:    Medical Decision Making  Medically screening exam initiated at 4:47 PM.  Appropriate orders placed.  GENNA CASIMIR was informed that the remainder of the evaluation will be completed by another provider, this initial triage assessment does not replace that evaluation, and the importance of remaining in the ED until their evaluation is complete.     Willma Duwaine [place], [place] 08/02/24 [phone]

## 2024-08-02 NOTE — ED Notes (Signed)
 Pt ate turkey sandwich and applesauce

## 2024-08-02 NOTE — ED Notes (Signed)
Urology at bedside at this time.

## 2024-08-02 NOTE — ED Notes (Signed)
 Pt placed on left side with home sacral pillow

## 2024-08-02 NOTE — ED Notes (Addendum)
 Pt has dexcom in place but no insulin  pump. BS is currently 230

## 2024-08-02 NOTE — ED Provider Notes (Signed)
 [place] [facility]MENT AT [facility] Provider Note   CSN: [id number] Arrival date & time: 08/02/24  1433     Patient presents with: Hematuria and Back Pain   Bridget Martinez is a 73 y.o. female.    Hematuria  Back Pain  Patient presents with 2 of her family members.  Past medical history of femoral neck fracture, osteoporosis, congestive heart failure, coronary artery disease, diabetes, hypertension, hyperlipidemia, hypothyroidism, history of MI, chronic venous stasis.   Patient was seen by her family provider on the 21st and had concerns for UTI and started a 7-day course of Keflex .  She presents to the ED today because she has noticed an increase in blood in her urine.  Denies pain initiating stream, denies pain around the outside of her vagina, denies abnormal discharge or sores on the outside of her vagina, is able to initiate her stream, and denies any itching or burning on urination.   She does have abdominal pain, last bowel movement was today and her bowel movements have been normal for her.  Thinks her abdominal pain is associated with her bowel movements because it typically resolves after having a bowel movement.  Stool is brown in color.  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  She says that she feels like it is getting worse but her daughter says that it has been getting better and she routinely looks at it.  Patient says this wound is from sitting and it does cause her pain.  She fell last night but is adamant that she did not hit her head or lose consciousness and remembers the entire event.  Her daughter says that she was found sitting straight up.  The patient is not sure why she fell but she felt lightheaded and sat to the ground from standing.  She denies pain in her arms or legs from the fall but has had increasing back pain.  She thinks the back pain has gotten worse over the last couple weeks but last night  made it worse.  Denies changes in vision or slurring of words and denies headache.  Denies nausea, denies vomiting, denies chest pain, denies dyspnea or shortness of breath.  She says that she came to the ED because she is worried about going on dialysis and worrying about dying.  Her family says that she has had low appetite for several weeks and has been trying to get her to drink protein shakes but it has been hard to get her to eat anything.     Prior to Admission medications   Medication Sig Start Date End Date Taking? Authorizing Provider  aspirin  81 MG EC tablet Take 1 tablet (81 mg total) by mouth daily. 12/13/16   [name], Toribio SAUNDERS, MD  buPROPion  (WELLBUTRIN  XL) 150 MG 24 hr tablet Take 1 tablet (150 mg total) by mouth 2 (two) times daily. 07/30/24   Severa [name], FNP  carvedilol  (COREG ) 12.5 MG tablet Take 1 tablet (12.5 mg total) by mouth 2 (two) times daily with a meal. PLEASE SCHEDULE APPOINTMENT FOR MORE REFILLS Patient not taking: Reported on 07/30/2024 07/06/24   [name], Toribio SAUNDERS, MD  cephALEXin  (KEFLEX ) 500 MG capsule Take 1 capsule (500 mg total) by mouth 3 (three) times daily for 7 days. 07/30/24 08/06/24  Severa [name], FNP  Continuous Blood Gluc Receiver (DEXCOM G6 RECEIVER) DEVI See admin instructions. 06/01/20   [provider]  Continuous Blood  Gluc Sensor (DEXCOM G6 SENSOR) MISC See admin instructions. 06/01/20   [provider]  epoetin  alfa (EPOGEN ) 3000 UNIT/ML injection Inject into the skin. 02/20/22   [provider]  ferrous sulfate  325 (65 FE) MG EC tablet TAKE 1 TABLET BY MOUTH EVERY OTHER DAY 07/19/24   Davonna Siad, MD  furosemide  (LASIX ) 20 MG tablet Take 20 mg by mouth once a week. 12/12/23   [provider]  insulin  aspart (NOVOLOG  FLEXPEN) 100 UNIT/ML FlexPen Inject 9-16 Units into the skin 3 (three) times daily with meals. Take 9 units at breakfast, 16 units at lunch, 14 units at bedtime 09/21/19   [provider]  LANTUS  SOLOSTAR 100 UNIT/ML Solostar Pen Inject 12 Units into the skin at bedtime. 09/23/23   [provider]  levothyroxine  (SYNTHROID ) 75 MCG tablet Take 75 mcg by mouth daily before breakfast.    [provider]  metoCLOPramide  (REGLAN ) 5 MG tablet TAKE 1 TABLET BY MOUTH 3 TIMES DAILY BEFORE MEALS. 05/04/24   Severa [name], FNP  ondansetron  (ZOFRAN ) 4 MG tablet Take 4 mg by mouth every 8 (eight) hours as needed. 10/03/23   [provider]  pantoprazole  (PROTONIX ) 40 MG tablet TAKE 1 TABLET BY MOUTH EVERY DAY 07/19/24   [name], [name], FNP  rosuvastatin  (CRESTOR ) 40 MG tablet Take 1 tablet (40 mg total) by mouth daily. 07/30/24   Severa [name], FNP  sodium bicarbonate  650 MG tablet Take 650 mg by mouth 3 (three) times daily. 02/16/22   [provider]  traMADol  (ULTRAM ) 50 MG tablet Take 1 tablet (50 mg total) by mouth 2 (two) times daily as needed for moderate pain (pain score 4-6). 07/30/24   Severa [name], FNP    Allergies: Penicillins, Sulfa antibiotics, Ciprofloxacin , Macrobid  [nitrofurantoin  monohyd macro], Other, and Ciprofloxacin  hcl    Review of Systems  Genitourinary:  Positive for hematuria.  Musculoskeletal:  Positive for back pain.    Updated Vital Signs BP (!) 126/51   Pulse 84   Temp 97.9 F (36.6 C) (Oral)   Resp 17   SpO2 100%   Physical Exam Vitals reviewed.  Constitutional:      General: She is not in acute distress.    Comments: Appears frail and cachectic  HENT:     Head: Normocephalic.     Nose: Nose normal.     Mouth/Throat:     Mouth: Mucous membranes are moist.     Pharynx: Oropharynx is clear.  Eyes:     Conjunctiva/sclera: Conjunctivae normal.  Cardiovascular:     Rate and Rhythm: Normal rate and regular rhythm.     Heart sounds: Normal heart sounds.  Pulmonary:     Effort: Pulmonary effort is normal.     Breath sounds: Normal breath sounds.  Abdominal:     General: Bowel sounds are normal. There is no  distension.     Palpations: Abdomen is soft.     Tenderness: There is abdominal tenderness.     Comments: Generalized tenderness to all 4 quadrants  Musculoskeletal:        General: No swelling or tenderness.     Cervical back: Normal range of motion. No tenderness.     Right lower leg: No edema.     Left lower leg: No edema.  Skin:    Coloration: Skin is pale.  Neurological:     General: No focal deficit present.     Mental Status: She is alert and oriented to person,  place, and time.     (all labs ordered are listed, but only abnormal results are displayed) Labs Reviewed  COMPREHENSIVE METABOLIC PANEL WITH GFR - Abnormal; Notable for the following components:      Result Value   Sodium 133 (*)    Potassium 2.9 (*)    Chloride 92 (*)    Glucose, Bld 291 (*)    BUN 89 (*)    Creatinine, Ser 2.08 (*)    Albumin  3.3 (*)    Alkaline Phosphatase 160 (*)    GFR, Estimated 25 (*)    Anion gap 16 (*)    All other components within normal limits  CBC - Abnormal; Notable for the following components:   WBC 11.6 (*)    RBC 3.34 (*)    Hemoglobin 9.8 (*)    HCT 29.1 (*)    RDW 18.4 (*)    Platelets 406 (*)    nRBC 0.3 (*)    All other components within normal limits  URINALYSIS, ROUTINE W REFLEX MICROSCOPIC - Abnormal; Notable for the following components:   Color, Urine RED (*)    APPearance TURBID (*)    Glucose, UA   (*)    Value: TEST NOT REPORTED DUE TO COLOR INTERFERENCE OF URINE PIGMENT   Hgb urine dipstick   (*)    Value: TEST NOT REPORTED DUE TO COLOR INTERFERENCE OF URINE PIGMENT   Bilirubin Urine   (*)    Value: TEST NOT REPORTED DUE TO COLOR INTERFERENCE OF URINE PIGMENT   Ketones, ur   (*)    Value: TEST NOT REPORTED DUE TO COLOR INTERFERENCE OF URINE PIGMENT   Protein, ur   (*)    Value: TEST NOT REPORTED DUE TO COLOR INTERFERENCE OF URINE PIGMENT   Nitrite   (*)    Value: TEST NOT REPORTED DUE TO COLOR INTERFERENCE OF URINE PIGMENT   Leukocytes,Ua   (*)     Value: TEST NOT REPORTED DUE TO COLOR INTERFERENCE OF URINE PIGMENT   All other components within normal limits  URINALYSIS, MICROSCOPIC (REFLEX) - Abnormal; Notable for the following components:   Bacteria, UA MANY (*)    All other components within normal limits  I-STAT CHEM 8, ED - Abnormal; Notable for the following components:   Sodium 133 (*)    Potassium 3.0 (*)    Chloride 96 (*)    BUN 80 (*)    Creatinine, Ser 2.00 (*)    Glucose, Bld 280 (*)    Calcium , Ion 1.13 (*)    Hemoglobin 10.9 (*)    HCT 32.0 (*)    All other components within normal limits  URINE CULTURE    EKG: None  Radiology: CT Renal Stone Study Result Date: 08/02/2024 EXAM: CT ABDOMEN AND PELVIS WITHOUT CONTRAST 08/02/2024 09:45:34 PM TECHNIQUE: CT of the abdomen and pelvis was performed without the administration of intravenous contrast. Multiplanar reformatted images are provided for review. Automated exposure control, iterative reconstruction, and/or weight-based adjustment of the mA/kV was utilized to reduce the radiation dose to as low as reasonably achievable. COMPARISON: 07/30/2014 CLINICAL HISTORY: Back pain and weakness, hematuria. FINDINGS: LOWER CHEST: Lung bases are clear. LIVER: The liver is within normal limits. GALLBLADDER AND BILE DUCTS: The gallbladder is within normal limits. No biliary ductal dilatation. SPLEEN: The spleen is unremarkable. PANCREAS: The pancreas is unremarkable. ADRENAL GLANDS: The adrenal glands are unremarkable. KIDNEYS, URETERS AND BLADDER: Kidneys are well visualized bilaterally. No renal calculi are seen. Fullness of the  right renal pelvis is noted although no ureteral dilatation is seen. Obstructive changes are noted on the left. The bladder is well distended with diffuse hyperdense material in the dependent portion. This likely represents thrombus consistent with the given clinical history. No definitive mass is seen. GI AND BOWEL: Stomach demonstrates no acute abnormality.  There is no bowel obstruction. PERITONEUM AND RETROPERITONEUM: No ascites. No free air. VASCULATURE: Aorta is normal in caliber. LYMPH NODES: No lymphadenopathy. REPRODUCTIVE ORGANS: The uterus is within normal limits. BONES AND SOFT TISSUES: No acute osseous abnormality. No focal soft tissue abnormality. IMPRESSION: 1. Bladder with dependent hyperdense material consistent with thrombus; no definite mass identified. Electronically signed by: Oneil Devonshire MD 08/02/2024 09:52 PM EST RP Workstation: [id number]   DG Lumbar Spine Complete Result Date: 08/02/2024 CLINICAL DATA:  Back pain after fall EXAM: DG LUMBAR SPINE COMPLETE 4+V COMPARISON:  Lumbar spine x-ray 05/01/2024 FINDINGS: The bones are diffusely osteopenic. There is moderate compression deformity of L2 which has progressed compared to the prior study. No additional fractures are seen. Alignment is anatomic. There is mild disc space narrowing and endplate osteophyte formation throughout the lumbar spine compatible with degenerative change. Left hip arthroplasty is present. IMPRESSION: 1. Moderate compression deformity of L2 which has progressed compared to the prior study. Correlate clinically for acute on chronic fracture. 2. Diffuse osteopenia. 3. Mild degenerative changes throughout the lumbar spine. Electronically Signed   By: Greig Pique M.D.   On: 08/02/2024 18:28     Procedures   Medications Ordered in the ED  Chlorhexidine  Gluconate Cloth 2 % PADS 6 each (has no administration in time range)  potassium chloride  SA (KLOR-CON  M) CR tablet 40 mEq (40 mEq Oral Given 08/02/24 1956)  lactated ringers  bolus 500 mL (0 mLs Intravenous Stopped 08/02/24 2133)  traMADol  (ULTRAM ) tablet 50 mg (50 mg Oral Given 08/02/24 2132)    Clinical Course as of 08/02/24 2334  Mon Aug 02, 2024  2224 Glucose, UA(!): TEST NOT REPORTED DUE TO COLOR INTERFERENCE OF URINE PIGMENT [EL]  2224 Urinalysis, Routine w reflex microscopic -Urine, Clean Catch(!) [EL]   2317 Glucose, UA(!): TEST NOT REPORTED DUE TO COLOR INTERFERENCE OF URINE PIGMENT [EL]    Clinical Course User Index [EL] Charmayne Holmes, DO                                 Medical Decision Making Amount and/or Complexity of Data Reviewed Labs: ordered. Decision-making details documented in ED Course. Radiology: ordered.  Risk Prescription drug management.   This patient presents to the ED for concern of worsening back pain, blood in her urine and at least 2 weeks of anorexia, this involves an extensive number of treatment options, and is a complaint that carries with it a high risk of complications and morbidity.  The differential diagnosis includes fracture, muscle strain, pyelonephritis, nephrolithiasis, complicated cystitis, constipation   Co morbidities / Chronic conditions that complicate the patient evaluation  Congestive heart failure, osteoporosis with complication, depression, diabetes, hypertension, GERD, hypothyroidism, history of MI   Additional history obtained:  Additional history obtained from EMR External records from outside source obtained and reviewed including recent office note from family care provider   Lab Tests:  I Ordered, and personally interpreted labs.  The pertinent results include: Sodium 133 potassium 2.9 glucose 291 chloride 92 BUN 89 creatinine 2.08, albumin  3.3 ALK Foss 160 eGFR 25 anion gap 16 White blood cell count  11.6, hemoglobin 9.8, platelets 406 UA results could not be reported due to pigment concentration, red color and bacteria many   Imaging Studies ordered:  I ordered imaging studies including x-ray lumbar, CT renal stone study I independently visualized and interpreted imaging which showed x-ray lumbar shows L2 compression fracture that has changed from prior potential for acute on chronic fracture as well as diffuse osteopenia.  CT reveals a thrombus in the bladder I agree with the radiologist interpretation   Problem List  / ED Course / Critical interventions / Medication management  2 weeks of anorexia, weakness, fatigue with electrolyte abnormalities I ordered medication including LR bolus, tramadol , potassium She is still not having pain with urination or problems initiating stream She does think that she can try to eat some food I have reviewed the patients home medicines and have made adjustments as needed   Consultations Obtained:  I requested consultation with the urology team,  and discussed lab and imaging findings as well as pertinent plan - they recommend: Placing a large catheter   Social Determinants of Health:  Support by family   Test / Admission - Considered:  Given weeks of anorexia with weakness and electrolyte abnormalities as well as new clot in bladder with an increase of red blood cells in urine, will admit this patient for further workup and management.  Spoke with admitting team, they will come see her.  Also spoke with urology who recommended catheter and will come see her tonight in the emergency department.      Final diagnoses:  Chronic kidney disease, unspecified CKD stage  Gross hematuria  Hypokalemia  Acute cystitis with hematuria    ED Discharge Orders     None          Charmayne Holmes, DO 08/02/24 2334    Tonia Chew, MD 08/03/24 [id number]

## 2024-08-02 NOTE — ED Triage Notes (Signed)
 Pt diagnosed w/ UTI x3 days ago and prescribed Keflex .  Per chart review, Provider is still waiting on the culture.  Family reports urine is darker today.  Also, Pt has has decreased intake x2-3 weeks.   Chart review shows urine resulted elevated microalbumin\creatinine as well.     Pt c/o chronic back pain from a fall.  Pain score 7/10.  Pt is prescribed Toradol for pain.

## 2024-08-02 NOTE — ED Triage Notes (Signed)
 Pt family reports pt was diagnosed with UTI on Friday, started on antibiotic. Noticed more blood in her urine today, thinks UTI is getting worse. Family also noted poor PO intake and increased weakness.

## 2024-08-03 ENCOUNTER — Other Ambulatory Visit: Payer: Self-pay

## 2024-08-03 DIAGNOSIS — G8929 Other chronic pain: Secondary | ICD-10-CM | POA: Diagnosis not present

## 2024-08-03 DIAGNOSIS — E039 Hypothyroidism, unspecified: Secondary | ICD-10-CM | POA: Diagnosis not present

## 2024-08-03 DIAGNOSIS — R319 Hematuria, unspecified: Secondary | ICD-10-CM | POA: Diagnosis present

## 2024-08-03 DIAGNOSIS — N3289 Other specified disorders of bladder: Secondary | ICD-10-CM | POA: Diagnosis not present

## 2024-08-03 DIAGNOSIS — K3184 Gastroparesis: Secondary | ICD-10-CM | POA: Diagnosis not present

## 2024-08-03 DIAGNOSIS — E876 Hypokalemia: Secondary | ICD-10-CM | POA: Diagnosis not present

## 2024-08-03 DIAGNOSIS — N189 Chronic kidney disease, unspecified: Secondary | ICD-10-CM | POA: Diagnosis not present

## 2024-08-03 DIAGNOSIS — N3000 Acute cystitis without hematuria: Secondary | ICD-10-CM | POA: Diagnosis present

## 2024-08-03 DIAGNOSIS — R310 Gross hematuria: Secondary | ICD-10-CM | POA: Diagnosis not present

## 2024-08-03 DIAGNOSIS — M545 Low back pain, unspecified: Secondary | ICD-10-CM | POA: Diagnosis not present

## 2024-08-03 DIAGNOSIS — E1043 Type 1 diabetes mellitus with diabetic autonomic (poly)neuropathy: Secondary | ICD-10-CM | POA: Diagnosis not present

## 2024-08-03 DIAGNOSIS — N3001 Acute cystitis with hematuria: Secondary | ICD-10-CM | POA: Diagnosis present

## 2024-08-03 DIAGNOSIS — Z7401 Bed confinement status: Secondary | ICD-10-CM | POA: Diagnosis not present

## 2024-08-03 DIAGNOSIS — I5042 Chronic combined systolic (congestive) and diastolic (congestive) heart failure: Secondary | ICD-10-CM | POA: Diagnosis not present

## 2024-08-03 LAB — CBG MONITORING, ED
Glucose-Capillary: 247 mg/dL — ABNORMAL HIGH (ref 70–99)
Glucose-Capillary: 344 mg/dL — ABNORMAL HIGH (ref 70–99)

## 2024-08-03 LAB — CBC WITH DIFFERENTIAL/PLATELET
Abs Immature Granulocytes: 0.07 K/uL (ref 0.00–0.07)
Basophils Absolute: 0 K/uL (ref 0.0–0.1)
Basophils Relative: 0 %
Eosinophils Absolute: 0.1 K/uL (ref 0.0–0.5)
Eosinophils Relative: 1 %
HCT: 25.2 % — ABNORMAL LOW (ref 36.0–46.0)
Hemoglobin: 8.2 g/dL — ABNORMAL LOW (ref 12.0–15.0)
Immature Granulocytes: 1 %
Lymphocytes Relative: 20 %
Lymphs Abs: 2 K/uL (ref 0.7–4.0)
MCH: 28.9 pg (ref 26.0–34.0)
MCHC: 32.5 g/dL (ref 30.0–36.0)
MCV: 88.7 fL (ref 80.0–100.0)
Monocytes Absolute: 0.8 K/uL (ref 0.1–1.0)
Monocytes Relative: 8 %
Neutro Abs: 6.7 K/uL (ref 1.7–7.7)
Neutrophils Relative %: 70 %
Platelets: 356 K/uL (ref 150–400)
RBC: 2.84 MIL/uL — ABNORMAL LOW (ref 3.87–5.11)
RDW: 18.2 % — ABNORMAL HIGH (ref 11.5–15.5)
WBC: 9.7 K/uL (ref 4.0–10.5)
nRBC: 0.2 % (ref 0.0–0.2)

## 2024-08-03 LAB — GLUCOSE, CAPILLARY: Glucose-Capillary: 222 mg/dL — ABNORMAL HIGH (ref 70–99)

## 2024-08-03 LAB — URINE CULTURE

## 2024-08-03 LAB — TROPONIN I (HIGH SENSITIVITY)
Troponin I (High Sensitivity): 58 ng/L — ABNORMAL HIGH (ref <18)
Troponin I (High Sensitivity): 59 ng/L — ABNORMAL HIGH (ref <18)

## 2024-08-03 LAB — BASIC METABOLIC PANEL WITH GFR
Anion gap: 16 — ABNORMAL HIGH (ref 5–15)
BUN: 75 mg/dL — ABNORMAL HIGH (ref 8–23)
CO2: 20 mmol/L — ABNORMAL LOW (ref 22–32)
Calcium: 8.8 mg/dL — ABNORMAL LOW (ref 8.9–10.3)
Chloride: 98 mmol/L (ref 98–111)
Creatinine, Ser: 1.82 mg/dL — ABNORMAL HIGH (ref 0.44–1.00)
GFR, Estimated: 29 mL/min — ABNORMAL LOW (ref >60)
Glucose, Bld: 300 mg/dL — ABNORMAL HIGH (ref 70–99)
Potassium: 3.5 mmol/L (ref 3.5–5.1)
Sodium: 134 mmol/L — ABNORMAL LOW (ref 135–145)

## 2024-08-03 LAB — MAGNESIUM: Magnesium: 1.7 mg/dL (ref 1.7–2.4)

## 2024-08-03 MED ORDER — PANTOPRAZOLE SODIUM 40 MG PO TBEC
40.0000 mg | DELAYED_RELEASE_TABLET | Freq: Every day | ORAL | Status: DC
  Administered 2024-08-03 – 2024-08-07 (×5): 40 mg via ORAL
  Filled 2024-08-03 (×5): qty 1

## 2024-08-03 MED ORDER — LEVOTHYROXINE SODIUM 75 MCG PO TABS
75.0000 ug | ORAL_TABLET | Freq: Every day | ORAL | Status: DC
  Administered 2024-08-03 – 2024-08-07 (×5): 75 ug via ORAL
  Filled 2024-08-03 (×5): qty 1

## 2024-08-03 MED ORDER — SODIUM CHLORIDE 0.9 % IV SOLN: 1.0000 g | INTRAVENOUS | Status: DC

## 2024-08-03 MED ORDER — SODIUM CHLORIDE 0.9 % IR SOLN
3000.0000 mL | Status: DC
  Administered 2024-08-03 (×2): 3000 mL

## 2024-08-03 MED ORDER — SODIUM CHLORIDE 0.9 % IV SOLN: 1.0000 g | Freq: Three times a day (TID) | INTRAVENOUS | Status: DC

## 2024-08-03 MED ORDER — SODIUM CHLORIDE 0.9 % IV SOLN
1.0000 g | INTRAVENOUS | Status: DC
  Administered 2024-08-03 – 2024-08-04 (×2): 1 g via INTRAVENOUS
  Filled 2024-08-03 (×2): qty 10

## 2024-08-03 MED ORDER — SODIUM BICARBONATE 650 MG PO TABS
650.0000 mg | ORAL_TABLET | Freq: Three times a day (TID) | ORAL | Status: DC
  Administered 2024-08-03 – 2024-08-07 (×13): 650 mg via ORAL
  Filled 2024-08-03 (×13): qty 1

## 2024-08-03 MED ORDER — METOCLOPRAMIDE HCL 10 MG PO TABS
5.0000 mg | ORAL_TABLET | Freq: Two times a day (BID) | ORAL | Status: DC
  Administered 2024-08-03 – 2024-08-07 (×9): 5 mg via ORAL
  Filled 2024-08-03 (×9): qty 1

## 2024-08-03 MED ORDER — INSULIN GLARGINE-YFGN 100 UNIT/ML ~~LOC~~ SOLN
6.0000 [IU] | Freq: Every day | SUBCUTANEOUS | Status: DC
  Filled 2024-08-03: qty 0.06

## 2024-08-03 MED ORDER — SODIUM CHLORIDE 0.9 % IV SOLN: INTRAVENOUS | Status: AC

## 2024-08-03 MED ORDER — MAGNESIUM SULFATE 4 GM/100ML IV SOLN
4.0000 g | Freq: Once | INTRAVENOUS | Status: AC
  Administered 2024-08-03: 4 g via INTRAVENOUS
  Filled 2024-08-03: qty 100

## 2024-08-03 MED ORDER — BUPROPION HCL ER (XL) 150 MG PO TB24
150.0000 mg | ORAL_TABLET | Freq: Two times a day (BID) | ORAL | Status: DC
  Administered 2024-08-03 – 2024-08-07 (×9): 150 mg via ORAL
  Filled 2024-08-03 (×9): qty 1

## 2024-08-03 MED ORDER — TRAMADOL HCL 50 MG PO TABS
50.0000 mg | ORAL_TABLET | Freq: Two times a day (BID) | ORAL | Status: DC | PRN
Start: 2024-08-03 — End: 2024-08-07
  Administered 2024-08-03 – 2024-08-06 (×5): 50 mg via ORAL
  Filled 2024-08-03 (×5): qty 1

## 2024-08-03 MED ORDER — PROCHLORPERAZINE EDISYLATE 10 MG/2ML IJ SOLN: 10.0000 mg | INTRAMUSCULAR | Status: DC | PRN

## 2024-08-03 MED ORDER — FERROUS SULFATE 325 (65 FE) MG PO TABS
325.0000 mg | ORAL_TABLET | ORAL | Status: DC
  Administered 2024-08-03 – 2024-08-07 (×3): 325 mg via ORAL
  Filled 2024-08-03 (×3): qty 1

## 2024-08-03 MED ORDER — INSULIN ASPART 100 UNIT/ML IJ SOLN
0.0000 [IU] | Freq: Three times a day (TID) | INTRAMUSCULAR | Status: DC
  Administered 2024-08-03: 3 [IU] via SUBCUTANEOUS
  Administered 2024-08-03: 5 [IU] via SUBCUTANEOUS
  Administered 2024-08-03: 3 [IU] via SUBCUTANEOUS
  Administered 2024-08-04 (×2): 6 [IU] via SUBCUTANEOUS
  Administered 2024-08-05 – 2024-08-06 (×4): 3 [IU] via SUBCUTANEOUS
  Administered 2024-08-06: 2 [IU] via SUBCUTANEOUS
  Administered 2024-08-06: 3 [IU] via SUBCUTANEOUS
  Administered 2024-08-07 (×2): 2 [IU] via SUBCUTANEOUS
  Filled 2024-08-03: qty 3
  Filled 2024-08-03: qty 6
  Filled 2024-08-03 (×2): qty 2
  Filled 2024-08-03: qty 6
  Filled 2024-08-03: qty 2
  Filled 2024-08-03: qty 5
  Filled 2024-08-03 (×2): qty 3

## 2024-08-03 NOTE — ED Notes (Signed)
 Pt moved onto specialty air mattress bed.

## 2024-08-03 NOTE — Plan of Care (Signed)
  Problem: Education: Goal: Knowledge of General Education information will improve Description: Including pain rating scale, medication(s)/side effects and non-pharmacologic comfort measures Outcome: Progressing   Problem: Health Behavior/Discharge Planning: Goal: Ability to manage health-related needs will improve Outcome: Progressing   Problem: Clinical Measurements: Goal: Ability to maintain clinical measurements within normal limits will improve Outcome: Progressing Goal: Will remain free from infection Outcome: Progressing Goal: Diagnostic test results will improve 08/03/2024 1950 by Regino Sayre, RN Outcome: Progressing 08/03/2024 1939 by Regino Sayre, RN Outcome: Progressing Goal: Respiratory complications will improve Outcome: Progressing Goal: Cardiovascular complication will be avoided Outcome: Progressing   Problem: Activity: Goal: Risk for activity intolerance will decrease Outcome: Progressing   Problem: Nutrition: Goal: Adequate nutrition will be maintained Outcome: Progressing   Problem: Coping: Goal: Level of anxiety will decrease Outcome: Progressing   Problem: Elimination: Goal: Will not experience complications related to bowel motility Outcome: Progressing Goal: Will not experience complications related to urinary retention 08/03/2024 1950 by Regino Sayre, RN Outcome: Progressing 08/03/2024 1939 by Regino Sayre, RN Outcome: Progressing   Problem: Pain Managment: Goal: General experience of comfort will improve and/or be controlled 08/03/2024 1950 by Regino Sayre, RN Outcome: Progressing 08/03/2024 1939 by Regino Sayre, RN Outcome: Progressing   Problem: Safety: Goal: Ability to remain free from injury will improve 08/03/2024 1950 by Regino Sayre, RN Outcome: Progressing 08/03/2024 1939 by Regino Sayre, RN Outcome: Progressing   Problem: Skin Integrity: Goal: Risk for impaired skin integrity will  decrease 08/03/2024 1950 by Regino Sayre, RN Outcome: Progressing 08/03/2024 1939 by Regino Sayre, RN Outcome: Progressing

## 2024-08-03 NOTE — Consult Note (Signed)
 Referring Physician: Doneta MARLA Drones, MD  Bridget Martinez is an 73 y.o. female.                       Chief Complaint: abnormal EKG and troponin I in patient with hematuria  HPI: 73 years old white female with PMH of type 2 DM, CKD IV, chronic combined systolic and diastolic heart failure, iron deficiency anemia and anemia of chronic disease, GERD, HTN, Hypothyroidism and hyperlipidemia has gross hematuria x 1 week. Her EKG shows sinus rhythm with LBBB. Her troponin I is minimally elevated.  Past Medical History:  Diagnosis Date   Arthritis    CHF (congestive heart failure) (HCC)    Coronary artery disease    angina.  MI.    Depression    anxiety   Diabetes mellitus    on insulin , with h/o DKA    GERD (gastroesophageal reflux disease)    Hiatal hernia.    HTN (hypertension)    Hyperlipidemia    Hypothyroidism    Left displaced femoral neck fracture (HCC) 11/19/2014   Myocardial infarction [facility])    NSTEMI 07/2011 with cardiogenic shock, s/p CABG 09/2011   Osteoporosis    Pneumonia        Tuberculosis    Venous stasis ulcers (HCC)       Past Surgical History:  Procedure Laterality Date   CARDIAC CATHETERIZATION     COLONOSCOPY N/A 11/18/2013   Procedure: COLONOSCOPY;  Surgeon: Princella CHRISTELLA Nida, MD;  Location: [facility] ENDOSCOPY;  Service: Endoscopy;  Laterality: N/A;   CORONARY ARTERY BYPASS GRAFT     Dr. Fleeta Trigt in 09/2011    CORONARY ARTERY BYPASS GRAFT  2012   ESOPHAGOGASTRODUODENOSCOPY N/A 11/17/2013   Procedure: ESOPHAGOGASTRODUODENOSCOPY (EGD);  Surgeon: Princella CHRISTELLA Nida, MD;  Location: [facility] ENDOSCOPY;  Service: Endoscopy;  Laterality: N/A;   LEFT HEART CATHETERIZATION WITH CORONARY ANGIOGRAM N/A 07/26/2011   Procedure: LEFT HEART CATHETERIZATION WITH CORONARY ANGIOGRAM;  Surgeon: Ezra GORMAN Shuck, MD;  Location: [facility] CATH LAB;  Service: Cardiovascular;  Laterality: N/A;   RIGHT HEART CATHETERIZATION N/A 07/29/2011   Procedure: RIGHT HEART CATH;  Surgeon: Lynwood Schilling, MD;   Location: [facility] CATH LAB;  Service: Cardiovascular;  Laterality: N/A;   TONSILLECTOMY     TOTAL HIP ARTHROPLASTY Left 11/21/2014   Procedure: TOTAL HIP ARTHROPLASTY ANTERIOR APPROACH;  Surgeon: Lonni CINDERELLA Poli, MD;  Location: [facility] OR;  Service: Orthopedics;  Laterality: Left;    Family History  Problem Relation Age of Onset   Heart disease Father    Multiple sclerosis Father    Hypertension Mother    Hyperthyroidism Mother    Diabetes Mother    Heart attack Paternal Grandfather    Heart disease Paternal Grandfather    Diabetes Cousin        Multiple maternal cousins with type 2 diabetes mellitus   Diabetes Maternal Uncle        Type 1 diabetes mellitus   Healthy Daughter    Asthma Son    Diabetes Son    Social History:  reports that she has never smoked. She has never used smokeless tobacco. She reports that she does not drink alcohol and does not use drugs.  Allergies:  Allergies  Allergen Reactions   Penicillins Anaphylaxis and Hives    Tolerates zosyn  Has patient had a PCN reaction causing immediate rash, facial/tongue/throat swelling, SOB or lightheadedness with hypotension: Yes Has patient had a PCN reaction causing severe rash involving mucus membranes  or skin necrosis: No Has patient had a PCN reaction that required hospitalization: Yes Has patient had a PCN reaction occurring within the last 10 years: No If all of the above answers are NO, then may proceed with Cephalosporin use.  ** HAS HAD CEFTRIAXONE  AND ZOSYN  IN PAST   Sulfa Antibiotics Anaphylaxis, Other (See Comments) and Swelling    Stiff neck also   Macrobid  [Nitrofurantoin  Monohyd Macro] Other (See Comments)    Skin peels off   Ciprofloxacin  Hcl Other (See Comments)    Skin peeling    (Not in a hospital admission)   Results for orders placed or performed during the hospital encounter of 08/02/24 (from the past 48 hours)  I-stat chem 8, ED (not at [facility], [facility] or [facility])     Status: Abnormal   Collection  Time: 08/02/24  5:11 PM  Result Value Ref Range   Sodium 133 (L) 135 - 145 mmol/L   Potassium 3.0 (L) 3.5 - 5.1 mmol/L   Chloride 96 (L) 98 - 111 mmol/L   BUN 80 (H) 8 - 23 mg/dL   Creatinine, Ser 7.99 (H) 0.44 - 1.00 mg/dL   Glucose, Bld 719 (H) 70 - 99 mg/dL    Comment: Glucose reference range applies only to samples taken after fasting for at least 8 hours.   Calcium , Ion 1.13 (L) 1.15 - 1.40 mmol/L   TCO2 22 22 - 32 mmol/L   Hemoglobin 10.9 (L) 12.0 - 15.0 g/dL   HCT 67.9 (L) 63.9 - 53.9 %  Comprehensive metabolic panel     Status: Abnormal   Collection Time: 08/02/24  5:30 PM  Result Value Ref Range   Sodium 133 (L) 135 - 145 mmol/L   Potassium 2.9 (L) 3.5 - 5.1 mmol/L   Chloride 92 (L) 98 - 111 mmol/L   CO2 25 22 - 32 mmol/L   Glucose, Bld 291 (H) 70 - 99 mg/dL    Comment: Glucose reference range applies only to samples taken after fasting for at least 8 hours.   BUN 89 (H) 8 - 23 mg/dL   Creatinine, Ser 7.91 (H) 0.44 - 1.00 mg/dL   Calcium  9.5 8.9 - 10.3 mg/dL   Total Protein 7.3 6.5 - 8.1 g/dL   Albumin  3.3 (L) 3.5 - 5.0 g/dL   AST 20 15 - 41 U/L   ALT 23 0 - 44 U/L   Alkaline Phosphatase 160 (H) 38 - 126 U/L   Total Bilirubin 0.8 0.0 - 1.2 mg/dL   GFR, Estimated 25 (L) >60 mL/min    Comment: (NOTE) Calculated using the CKD-EPI Creatinine Equation (2021)    Anion gap 16 (H) 5 - 15    Comment: Performed at [facility], [address], [place], [place] [address]  CBC     Status: Abnormal   Collection Time: 08/02/24  5:30 PM  Result Value Ref Range   WBC 11.6 (H) 4.0 - 10.5 K/uL   RBC 3.34 (L) 3.87 - 5.11 MIL/uL   Hemoglobin 9.8 (L) 12.0 - 15.0 g/dL   HCT 70.8 (L) 63.9 - 53.9 %   MCV 87.1 80.0 - 100.0 fL   MCH 29.3 26.0 - 34.0 pg   MCHC 33.7 30.0 - 36.0 g/dL   RDW 81.5 (H) 88.4 - 84.4 %   Platelets 406 (H) 150 - 400 K/uL   nRBC 0.3 (H) 0.0 - 0.2 %    Comment: Performed at [facility], [address] [place], [place]  [address]  Urinalysis, Routine  w reflex microscopic -Urine, Clean Catch     Status: Abnormal   Collection Time: 08/02/24  9:02 PM  Result Value Ref Range   Color, Urine RED (A) YELLOW    Comment: BIOCHEMICALS MAY BE AFFECTED BY COLOR   APPearance TURBID (A) CLEAR   Specific Gravity, Urine  1.005 - 1.030    TEST NOT REPORTED DUE TO COLOR INTERFERENCE OF URINE PIGMENT   pH  5.0 - 8.0    TEST NOT REPORTED DUE TO COLOR INTERFERENCE OF URINE PIGMENT   Glucose, UA (A) NEGATIVE mg/dL    TEST NOT REPORTED DUE TO COLOR INTERFERENCE OF URINE PIGMENT   Hgb urine dipstick (A) NEGATIVE    TEST NOT REPORTED DUE TO COLOR INTERFERENCE OF URINE PIGMENT   Bilirubin Urine (A) NEGATIVE    TEST NOT REPORTED DUE TO COLOR INTERFERENCE OF URINE PIGMENT   Ketones, ur (A) NEGATIVE mg/dL    TEST NOT REPORTED DUE TO COLOR INTERFERENCE OF URINE PIGMENT   Protein, ur (A) NEGATIVE mg/dL    TEST NOT REPORTED DUE TO COLOR INTERFERENCE OF URINE PIGMENT   Nitrite (A) NEGATIVE    TEST NOT REPORTED DUE TO COLOR INTERFERENCE OF URINE PIGMENT   Leukocytes,Ua (A) NEGATIVE    TEST NOT REPORTED DUE TO COLOR INTERFERENCE OF URINE PIGMENT    Comment: Performed at [facility], [address], [place], [place] [address]  Urinalysis, Microscopic (reflex)     Status: Abnormal   Collection Time: 08/02/24  9:02 PM  Result Value Ref Range   RBC / HPF >50 0 - 5 RBC/hpf   WBC, UA >50 0 - 5 WBC/hpf   Bacteria, UA MANY (A) NONE SEEN   Squamous Epithelial / HPF 21-50 0 - 5 /HPF   Mucus PRESENT     Comment: Performed at [facility], [address], [place], [place] [address]  Troponin I (High Sensitivity)     Status: Abnormal   Collection Time: 08/03/24  1:12 AM  Result Value Ref Range   Troponin I (High Sensitivity) 58 (H) <18 ng/L    Comment: (NOTE) Elevated high sensitivity troponin I (hsTnI) values and significant  changes across serial measurements may suggest ACS but many other  chronic and acute conditions are known to elevate hsTnI results.   Refer to the Links section for chest pain algorithms and additional  guidance. Performed at [facility], [address], [place], [place] [address]   Basic metabolic panel     Status: Abnormal   Collection Time: 08/03/24  1:12 AM  Result Value Ref Range   Sodium 134 (L) 135 - 145 mmol/L   Potassium 3.5 3.5 - 5.1 mmol/L   Chloride 98 98 - 111 mmol/L   CO2 20 (L) 22 - 32 mmol/L   Glucose, Bld 300 (H) 70 - 99 mg/dL    Comment: Glucose reference range applies only to samples taken after fasting for at least 8 hours.   BUN 75 (H) 8 - 23 mg/dL   Creatinine, Ser 8.17 (H) 0.44 - 1.00 mg/dL   Calcium  8.8 (L) 8.9 - 10.3 mg/dL   GFR, Estimated 29 (L) >60 mL/min    Comment: (NOTE) Calculated using the CKD-EPI Creatinine Equation (2021)    Anion gap 16 (H) 5 - 15    Comment: Performed at [facility], [address], [place], [place] [address]  Magnesium      Status: None  Collection Time: 08/03/24  1:12 AM  Result Value Ref Range   Magnesium  1.7 1.7 - 2.4 mg/dL    Comment: Performed at [facility], [address] [place], [place] [address]  CBC with Differential/Platelet     Status: None (Preliminary result)   Collection Time: 08/03/24  1:12 AM  Result Value Ref Range   WBC NURSE WILL PUT ORDER BACK IN 08/03/2024 0158 4.0 - 10.5 K/uL   RBC NURSE WILL PUT ORDER BACK IN 08/03/2024 0158 3.87 - 5.11 MIL/uL   Hemoglobin NURSE WILL PUT ORDER BACK IN 08/03/2024 0158 12.0 - 15.0 g/dL   HCT NURSE WILL PUT ORDER BACK IN 08/03/2024 0158 36.0 - 46.0 %   MCV NURSE WILL PUT ORDER BACK IN 08/03/2024 0158 80.0 - 100.0 fL   MCH NURSE WILL PUT ORDER BACK IN 08/03/2024 0158 26.0 - 34.0 pg   MCHC NURSE WILL PUT ORDER BACK IN 08/03/2024 0158 30.0 - 36.0 g/dL   RDW NURSE WILL PUT ORDER BACK IN 08/03/2024 0158 11.5 - 15.5 %   Platelets NURSE WILL PUT ORDER BACK IN 08/03/2024 0158 150 - 400 K/uL   nRBC NURSE WILL PUT ORDER BACK IN 08/03/2024 0158 0.0 - 0.2 %   Neutrophils Relative %  PENDING %   Neutro Abs PENDING 1.7 - 7.7 K/uL   Band Neutrophils PENDING %   Lymphocytes Relative PENDING %   Lymphs Abs PENDING 0.7 - 4.0 K/uL   Monocytes Relative PENDING %   Monocytes Absolute PENDING 0.1 - 1.0 K/uL   Eosinophils Relative PENDING %   Eosinophils Absolute PENDING 0.0 - 0.5 K/uL   Basophils Relative PENDING %   Basophils Absolute PENDING 0.0 - 0.1 K/uL   WBC Morphology PENDING    RBC Morphology PENDING    Smear Review PENDING    Other PENDING %   nRBC PENDING 0 /100 WBC   Metamyelocytes Relative PENDING %   Myelocytes PENDING %   Promyelocytes Relative PENDING %   Blasts PENDING %   Immature Granulocytes PENDING %   Abs Immature Granulocytes PENDING 0.00 - 0.07 K/uL   CT Renal Stone Study Result Date: 08/02/2024 EXAM: CT ABDOMEN AND PELVIS WITHOUT CONTRAST 08/02/2024 09:45:34 PM TECHNIQUE: CT of the abdomen and pelvis was performed without the administration of intravenous contrast. Multiplanar reformatted images are provided for review. Automated exposure control, iterative reconstruction, and/or weight-based adjustment of the mA/kV was utilized to reduce the radiation dose to as low as reasonably achievable. COMPARISON: 07/30/2014 CLINICAL HISTORY: Back pain and weakness, hematuria. FINDINGS: LOWER CHEST: Lung bases are clear. LIVER: The liver is within normal limits. GALLBLADDER AND BILE DUCTS: The gallbladder is within normal limits. No biliary ductal dilatation. SPLEEN: The spleen is unremarkable. PANCREAS: The pancreas is unremarkable. ADRENAL GLANDS: The adrenal glands are unremarkable. KIDNEYS, URETERS AND BLADDER: Kidneys are well visualized bilaterally. No renal calculi are seen. Fullness of the right renal pelvis is noted although no ureteral dilatation is seen. Obstructive changes are noted on the left. The bladder is well distended with diffuse hyperdense material in the dependent portion. This likely represents thrombus consistent with the given clinical  history. No definitive mass is seen. GI AND BOWEL: Stomach demonstrates no acute abnormality. There is no bowel obstruction. PERITONEUM AND RETROPERITONEUM: No ascites. No free air. VASCULATURE: Aorta is normal in caliber. LYMPH NODES: No lymphadenopathy. REPRODUCTIVE ORGANS: The uterus is within normal limits. BONES AND SOFT TISSUES: No acute osseous abnormality. No focal soft tissue abnormality. IMPRESSION: 1. Bladder  with dependent hyperdense material consistent with thrombus; no definite mass identified. Electronically signed by: Oneil Devonshire MD 08/02/2024 09:52 PM EST RP Workstation: [id number]   DG Lumbar Spine Complete Result Date: 08/02/2024 CLINICAL DATA:  Back pain after fall EXAM: DG LUMBAR SPINE COMPLETE 4+V COMPARISON:  Lumbar spine x-ray 05/01/2024 FINDINGS: The bones are diffusely osteopenic. There is moderate compression deformity of L2 which has progressed compared to the prior study. No additional fractures are seen. Alignment is anatomic. There is mild disc space narrowing and endplate osteophyte formation throughout the lumbar spine compatible with degenerative change. Left hip arthroplasty is present. IMPRESSION: 1. Moderate compression deformity of L2 which has progressed compared to the prior study. Correlate clinically for acute on chronic fracture. 2. Diffuse osteopenia. 3. Mild degenerative changes throughout the lumbar spine. Electronically Signed   By: Greig Pique M.D.   On: 08/02/2024 18:28    Review Of Systems Constitutional: No fever, chills, chronic weight loss. Eyes: No vision change, wears glasses. No discharge or pain. Ears: No hearing loss, No tinnitus. Respiratory: No asthma, COPD, pneumonias. No shortness of breath. No hemoptysis. Cardiovascular: No chest pain, palpitation, leg edema. Gastrointestinal: No nausea, vomiting, diarrhea, constipation. No GI bleed. No hepatitis. Genitourinary: No dysuria, positive hematuria, no kidney stone. No  incontinance. Neurological: No headache, stroke, seizures.  Psychiatry: No psych facility admission for anxiety, depression, suicide. No detox. Skin: No rash. Musculoskeletal: Positive joint pain, no fibromyalgia. No neck pain, back pain. Lymphadenopathy: No lymphadenopathy. Hematology: Positive anemia, No easy bruising.   Blood pressure (!) 108/54, pulse 84, temperature 98.2 F (36.8 C), temperature source Oral, resp. rate 18, SpO2 100%. There is no height or weight on file to calculate BMI. General appearance: alert, cooperative, appears stated age and no distress Head: Normocephalic, atraumatic. Eyes: Blue eyes, Pale pink conjunctiva, corneas clear. Neck: No adenopathy, no carotid bruit, no JVD, supple, symmetrical, trachea midline and thyroid  not enlarged. Resp: Clear to auscultation bilaterally. Cardio: Regular rate and rhythm, S1, S2 normal, III/VI systolic murmur, no click, rub or gallop GI: Soft, non-tender; bowel sounds normal; no organomegaly. Extremities: No edema, cyanosis or clubbing. Skin: Warm and dry.  Neurologic: Alert and oriented X 3, normal strength. Normal coordination.  Assessment/Plan Abnormal EKG with LBBB Abnormal troponin I with demand ischemia Combined systolic and diastolic left heart failure Hematuria CKD, IV HTN HLD Type 2 DM Hypothyroidism Mitral regurgitation Tricuspid regurgitation  Plan: Patient is not a candidate for cardiac intervention due to active bleed. Medical treatment for now.   Time spent: Review of old records, Lab, x-rays, EKG, other cardiac tests, examination, discussion with patient/Doctor/Nurse over 70 minutes.  Salena GORMAN Negri, MD  08/03/2024, 2:35 AM

## 2024-08-03 NOTE — Procedures (Signed)
 Procedure note  Procedure: Catheter placement with bladder irrigation  Indication: Gross hematuria  Procedure in detail: Patient prepped and draped in usual sterile fashion a 22 French hematuria catheter was inserted in the bladder in a sterile fashion 20 cc were inflated in the balloon.  Patient was then irrigated with 2 L of saline returning about 50 cc of clot patient was light pink at the end of irrigation patient was placed on CBI at a low drip after irrigation was completed.

## 2024-08-03 NOTE — Consult Note (Signed)
 WOC Nurse Consult Note: Reason for Consult:  pressure injury Patient follow outpatient by PCP for pressure injury Wound type:  Stage 3 Pressure Injury  Pressure Injury POA: Yes Measurement: see nursing flow sheets Wound bed: 100% clean, pink Drainage (amount, consistency, odor) see nursing flowsheets Periwound: epibole and macerated  Dressing procedure/placement/frequency: Cleanse wound with Carolynn Soila # [phone]), pack with silver hydrofiber Soila [phone]), cover with ABD pad, secure with tape. Change daily Low air loss mattress for moisture management and pressure redistribution.    Re consult if needed, will not follow at this time. Thanks  Blondell [employer], RN,CWOCN, CNS, [employer] [phone]

## 2024-08-03 NOTE — Progress Notes (Signed)
 [facility]   ROUNDING   NOTE Bridget Martinez FMW:[id number]  DOB: 24-Dec-1950  DOA: 08/02/2024  PCP: Severa [name], FNP  08/03/2024,8:00 AM  LOS: 0 days    Code Status: Full code     from: Home   2 female Anemia on Retacrit  secondary to stage IV CKD followed by Dr. Rachele Hypothyroidism CABG 2013 chronic left bundle branch block-NSTEMI 10/2014--- previously not a candidate for cath secondary to renal dysfunction Diabetes mellitus type 2 with gastroparesis and previous DKA Previous left hip fracture 2016  Hospitalized 8 23-8/27 left inferior pubic ramus fracture left 10th rib fracture indeterminant T5 compression fracture saw orthopedics in the hospital after the fall neurosurgery felt no workup she had a full-thickness wound over upper coccyx sepsis was ruled out  Diagnosed with UTI ~ 11/21 Rx Keflex -subsequently found blood in urine poor p.o. intake weakness sustained a fall on 11/24 did not hit head no blurred vision no double vision Passing large clots of blood in urine Dr. Shane of urology's saw the patient in consult CBT was started Sodium 133 potassium 3.0 BUN/creatinine 80/2.0 WBC 11.6 hemoglobin 9.8 platelet 406-   Pertinent imaging/studies till date  lumbar spine x-ray moderate compression deformity L2 with progression CT renal study bladder with dependent hyperdense material consistent with thrombus no definite mass identified   Assessment  & Plan :    Gross hematuria with clots in the setting of possible UTI UTI treated with Keflex  in the outpatient setting Appreciate urology-CBT as per them Aspirin  81 PTA held given bleeding Further discussion next hello several days  AKI secondary to hematuria superimposed on CKD 3b--- CC Dr. Rachele nephrology at discharge Baseline creatinine is about 1.9 baseline GFR is about 27- Metabolic acidosis on admission Improving continue saline 50 cc/H received bolus IV fluid on arrival kidney function already improving Resumed sodium bicarb  650 3 times daily Daily labs and trend to resolution-kidney function is improving  Abnormal EKG with left bundle branch block (previous CABG 2013)-NSTEMI 2016 Elevated troponin gray zone 58 likely representing demand ischemia----only home meds Lasix  and Crestor  Cardiology has seen the patient no recommendations for cath and previously has not been a candidate for the same No GDMT?  Defer for now---may need OP discussion when creat improves Last echo 06/11/2021 EF 30-35% grade 2 dysfunction with pseudonormalization moderate mitral valve regurg--PTA Lasix  20 on Thursdays has been held  Electrolyte abnormalities hypokalemia hypomagnesemia Hyponatremia-resolved with fluid see above discussion Hypomagnesemia hypokalemia Replaced with IV magnesium  2 g, p.o. K-Dur check mag in a.m. start Mag-Ox 400 twice daily potassium is now normalized--- recheck in a.m.  Diabetes mellitus type 2 Diabetic gastroparesis and previous DKA Advance diet resume Lantus  6 units but 6 units twice daily placed on sliding scale For gastroparesis continue Reglan  5 twice daily meals  Recent multiple falls at home last 1 on 11/24--- previous fall she says was 8 weeks ago History of sacral wound---dressing with Calcium  Alginate---dressing at home for the past couple of weeks ---they were going to try Collagen--[facility] home care has been coming out to help since her hospitalization History of compression fractures etc. Imaging as above shows moderate compression deformity L2 Placed TLSO brace continue home tramadol  50 twice daily monitor response Therapy to evaluate-she tells me she walks with a walker and does not want to go to rehab  Adult frailty  Long discussion with [name] daughter on the phone tells me patient would not ohave wanted dialysis but does vacillate about this Daughter as well  as wound care she has been dressing the wound with alginate  She spent 6 weeks at rehab and is hesitant to go to another rehab I have  mentioned that if recurrent hospitalizations and frailty occur she may need to discuss in the setting of chronic diseases goals of care-we did not broach palliative care today hopeful she can mobilize and do well enough to go home otherwise family and patient will discuss skilled placement  Data Reviewed today:  Sodium 134 potassium 3.5 chloride 98 CO2 20 BUN/creatinine 75/1.8 anion gap 16 Troponin 58 WBC 9.7 hemoglobin 8.2 platelet 356   DVT prophylaxis: SCD only  Status is: Inpatient Remains inpatient appropriate because: Requires treatment of hematuria and therapy eval    Dispo/Global plan: Unclear she may not want to go to skilled   Time 60   Subjective:   Sitting up in bed tells me she had a fall on Sunday feels okay overall no chest pain whatsoever eating Jell-O No fever no chills No nausea no vomiting   Objective + exam Vitals:   08/03/24 0015 08/03/24 0123 08/03/24 0514 08/03/24 0745  BP: (!) 108/54  111/63 (!) 115/40  Pulse: 84  77 79  Resp:   16 18  Temp:  98.2 F (36.8 C) 98.1 F (36.7 C)   TempSrc:  Oral Oral   SpO2: 100%  100% 100%   There were no vitals filed for this visit.   Examination: Cachectic white female no distress no JVD CTAB no rales rhonchi ROM is intact to upper extremities power 5/5 Straight leg raise equal Only little bit of back pain-TLSO is not on S1-S2 no murmur seems to be sinus   Scheduled Meds:  buPROPion   150 mg Oral BID WC   Chlorhexidine  Gluconate Cloth  6 each Topical Daily   ferrous sulfate   325 mg Oral QODAY   insulin  aspart  0-6 Units Subcutaneous TID WC   [START ON 08/04/2024] insulin  glargine-yfgn  6 Units Subcutaneous QHS   levothyroxine   75 mcg Oral Q0600   metoCLOPramide   5 mg Oral BID AC   pantoprazole   40 mg Oral Daily   sodium bicarbonate   650 mg Oral TID   Continuous Infusions:  sodium chloride      cefTRIAXone  (ROCEPHIN )  IV Stopped (08/03/24 0308)   sodium chloride  irrigation     prochlorperazine ,  traMADol   Bridget Jakeem Grape, MD  [facility]

## 2024-08-03 NOTE — ED Notes (Signed)
 Urology at bedside and stopped the CBI. Urologist states that if the urine is still yellow/clear around noon, then disconnect CBI irrigation from the Foley.

## 2024-08-03 NOTE — Progress Notes (Signed)
 Subjective: Patient doing well, no pain urine is clear.   Objective: Vital signs in last 24 hours: Temp:  [97.9 F (36.6 C)-98.2 F (36.8 C)] 98 F (36.7 C) (11/25 0822) Pulse Rate:  [77-93] 89 (11/25 0900) Resp:  [15-18] 15 (11/25 0900) BP: (108-136)/(40-63) 115/42 (11/25 0900) SpO2:  [99 %-100 %] 100 % (11/25 0900)  Intake/Output from previous day: 11/24 0701 - 11/25 0700 In: -  Out: 6550 [Urine:6550] Intake/Output this shift: No intake/output data recorded.  Physical Exam:  General: Alert and oriented CV: RRR Lungs: Clear Abdomen: Soft, ND, ATTP GU: urine yellow on very low rate CBI.   Lab Results: Recent Labs    08/02/24 1730 08/03/24 0112 08/03/24 0219  HGB 9.8* NURSE WILL PUT ORDER BACK IN 08/03/2024 0158 8.2*  HCT 29.1* NURSE WILL PUT ORDER BACK IN 08/03/2024 0158 25.2*   BMET Recent Labs    08/02/24 1730 08/03/24 0112  NA 133* 134*  K 2.9* 3.5  CL 92* 98  CO2 25 20*  GLUCOSE 291* 300*  BUN 89* 75*  CREATININE 2.08* 1.82*  CALCIUM  9.5 8.8*     Studies/Results: CT Renal Stone Study Result Date: 08/02/2024 EXAM: CT ABDOMEN AND PELVIS WITHOUT CONTRAST 08/02/2024 09:45:34 PM TECHNIQUE: CT of the abdomen and pelvis was performed without the administration of intravenous contrast. Multiplanar reformatted images are provided for review. Automated exposure control, iterative reconstruction, and/or weight-based adjustment of the mA/kV was utilized to reduce the radiation dose to as low as reasonably achievable. COMPARISON: 07/30/2014 CLINICAL HISTORY: Back pain and weakness, hematuria. FINDINGS: LOWER CHEST: Lung bases are clear. LIVER: The liver is within normal limits. GALLBLADDER AND BILE DUCTS: The gallbladder is within normal limits. No biliary ductal dilatation. SPLEEN: The spleen is unremarkable. PANCREAS: The pancreas is unremarkable. ADRENAL GLANDS: The adrenal glands are unremarkable. KIDNEYS, URETERS AND BLADDER: Kidneys are well visualized  bilaterally. No renal calculi are seen. Fullness of the right renal pelvis is noted although no ureteral dilatation is seen. Obstructive changes are noted on the left. The bladder is well distended with diffuse hyperdense material in the dependent portion. This likely represents thrombus consistent with the given clinical history. No definitive mass is seen. GI AND BOWEL: Stomach demonstrates no acute abnormality. There is no bowel obstruction. PERITONEUM AND RETROPERITONEUM: No ascites. No free air. VASCULATURE: Aorta is normal in caliber. LYMPH NODES: No lymphadenopathy. REPRODUCTIVE ORGANS: The uterus is within normal limits. BONES AND SOFT TISSUES: No acute osseous abnormality. No focal soft tissue abnormality. IMPRESSION: 1. Bladder with dependent hyperdense material consistent with thrombus; no definite mass identified. Electronically signed by: Oneil Devonshire MD 08/02/2024 09:52 PM EST RP Workstation: [id number]   DG Lumbar Spine Complete Result Date: 08/02/2024 CLINICAL DATA:  Back pain after fall EXAM: DG LUMBAR SPINE COMPLETE 4+V COMPARISON:  Lumbar spine x-ray 05/01/2024 FINDINGS: The bones are diffusely osteopenic. There is moderate compression deformity of L2 which has progressed compared to the prior study. No additional fractures are seen. Alignment is anatomic. There is mild disc space narrowing and endplate osteophyte formation throughout the lumbar spine compatible with degenerative change. Left hip arthroplasty is present. IMPRESSION: 1. Moderate compression deformity of L2 which has progressed compared to the prior study. Correlate clinically for acute on chronic fracture. 2. Diffuse osteopenia. 3. Mild degenerative changes throughout the lumbar spine. Electronically Signed   By: Greig Pique M.D.   On: 08/02/2024 18:28    Assessment/Plan: 74 F w/ GH, CBI started urine clear.   Plan: - CBI clamped  -  urology will set up outpatient cystoscopy and f/u  - recommend f/u Ucx and DC on  abx - if urine remains clear later today can void trial tomorrow AM  - urology will sign off and set up f/u    LOS: 0 days   Jackey Pea MD 08/03/2024, 9:11 AM [facility]

## 2024-08-03 NOTE — Consult Note (Signed)
 I have been asked to see the patient by Dr. Joshua Zavitz, for evaluation and management of gross hematuria.  History of present illness: 73 year old female presenting gross hematuria since Friday she has some dysuria but denies any fevers chills nausea or vomiting.  Per family she has had anorexia for the past several days.  She denies any straining with urination but has been passing large clots.  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  Patient denies any GU surgeries or ever seeing a urologist.  She has a past medical history of femoral neck fracture, osteoporosis CHF, C AD, diabetes, HTN, HLD, hypothyroidism, MI and chronic venous stasis.   Review of systems: A 12 point comprehensive review of systems was obtained and is negative unless otherwise stated in the history of present illness.  Patient Active Problem List   Diagnosis Date Noted   Nausea without vomiting 11/12/2023   History of tuberculosis 11/10/2020   Osteoporosis 11/10/2020   Anemia in chronic kidney disease 09/09/2019   Diabetic retinopathy (HCC) 01/28/2019   Hyperlipidemia due to type 1 diabetes mellitus (HCC) 08/14/2018   Onychomycosis of multiple toenails with type 1 diabetes mellitus (HCC) 08/14/2018   GERD (gastroesophageal reflux disease) 02/10/2017   History of non-ST elevation myocardial infarction (NSTEMI)    Diabetic gastroparesis associated with type 1 diabetes mellitus (HCC)    Chronic combined systolic and diastolic congestive heart failure (HCC) 09/19/2014   Acquired hypothyroidism 09/19/2014   CKD (chronic kidney disease) stage 4, GFR 15-29 ml/min (HCC) 11/11/2013   Hypertension associated with diabetes (HCC)    Heart murmur    CAD, multiple vessel 07/27/2011   Recurrent depression 07/25/2011    No current facility-administered medications on file prior to encounter.   Current Outpatient Medications on File Prior to Encounter  Medication Sig Dispense Refill    aspirin  81 MG EC tablet Take 1 tablet (81 mg total) by mouth daily. 30 tablet 3   buPROPion  (WELLBUTRIN  XL) 150 MG 24 hr tablet Take 1 tablet (150 mg total) by mouth 2 (two) times daily. 180 tablet 1   carvedilol  (COREG ) 12.5 MG tablet Take 1 tablet (12.5 mg total) by mouth 2 (two) times daily with a meal. PLEASE SCHEDULE APPOINTMENT FOR MORE REFILLS (Patient not taking: Reported on 07/30/2024) 60 tablet 0   cephALEXin  (KEFLEX ) 500 MG capsule Take 1 capsule (500 mg total) by mouth 3 (three) times daily for 7 days. 21 capsule 0   Continuous Blood Gluc Receiver (DEXCOM G6 RECEIVER) DEVI See admin instructions.     Continuous Blood Gluc Sensor (DEXCOM G6 SENSOR) MISC See admin instructions.     epoetin  alfa (EPOGEN ) 3000 UNIT/ML injection Inject into the skin.     ferrous sulfate  325 (65 FE) MG EC tablet TAKE 1 TABLET BY MOUTH EVERY OTHER DAY 45 tablet 3   furosemide  (LASIX ) 20 MG tablet Take 20 mg by mouth once a week.     insulin  aspart (NOVOLOG  FLEXPEN) 100 UNIT/ML FlexPen Inject 9-16 Units into the skin 3 (three) times daily with meals. Take 9 units at breakfast, 16 units at lunch, 14 units at bedtime     LANTUS  SOLOSTAR 100 UNIT/ML Solostar Pen Inject 12 Units into the skin at bedtime.     levothyroxine  (SYNTHROID ) 75 MCG tablet Take 75 mcg by mouth daily before breakfast.     metoCLOPramide  (REGLAN ) 5 MG tablet TAKE 1 TABLET BY MOUTH 3 TIMES DAILY BEFORE MEALS. 270 tablet 0  ondansetron  (ZOFRAN ) 4 MG tablet Take 4 mg by mouth every 8 (eight) hours as needed.     pantoprazole  (PROTONIX ) 40 MG tablet TAKE 1 TABLET BY MOUTH EVERY DAY 90 tablet 1   rosuvastatin  (CRESTOR ) 40 MG tablet Take 1 tablet (40 mg total) by mouth daily. 90 tablet 1   sodium bicarbonate  650 MG tablet Take 650 mg by mouth 3 (three) times daily.     traMADol  (ULTRAM ) 50 MG tablet Take 1 tablet (50 mg total) by mouth 2 (two) times daily as needed for moderate pain (pain score 4-6). 60 tablet 0    Past Medical History:   Diagnosis Date   Arthritis    CHF (congestive heart failure) (HCC)    Coronary artery disease    angina.  MI.    Depression    anxiety   Diabetes mellitus    on insulin , with h/o DKA    GERD (gastroesophageal reflux disease)    Hiatal hernia.    HTN (hypertension)    Hyperlipidemia    Hypothyroidism    Left displaced femoral neck fracture (HCC) 11/19/2014   Myocardial infarction [facility])    NSTEMI 07/2011 with cardiogenic shock, s/p CABG 09/2011   Osteoporosis    Pneumonia        Tuberculosis    Venous stasis ulcers (HCC)     Past Surgical History:  Procedure Laterality Date   CARDIAC CATHETERIZATION     COLONOSCOPY N/A 11/18/2013   Procedure: COLONOSCOPY;  Surgeon: Princella CHRISTELLA Nida, MD;  Location: [facility] ENDOSCOPY;  Service: Endoscopy;  Laterality: N/A;   CORONARY ARTERY BYPASS GRAFT     Dr. Fleeta Trigt in 09/2011    CORONARY ARTERY BYPASS GRAFT  2012   ESOPHAGOGASTRODUODENOSCOPY N/A 11/17/2013   Procedure: ESOPHAGOGASTRODUODENOSCOPY (EGD);  Surgeon: Princella CHRISTELLA Nida, MD;  Location: [facility] ENDOSCOPY;  Service: Endoscopy;  Laterality: N/A;   LEFT HEART CATHETERIZATION WITH CORONARY ANGIOGRAM N/A 07/26/2011   Procedure: LEFT HEART CATHETERIZATION WITH CORONARY ANGIOGRAM;  Surgeon: Ezra GORMAN Shuck, MD;  Location: [facility] CATH LAB;  Service: Cardiovascular;  Laterality: N/A;   RIGHT HEART CATHETERIZATION N/A 07/29/2011   Procedure: RIGHT HEART CATH;  Surgeon: Lynwood Schilling, MD;  Location: [facility] CATH LAB;  Service: Cardiovascular;  Laterality: N/A;   TONSILLECTOMY     TOTAL HIP ARTHROPLASTY Left 11/21/2014   Procedure: TOTAL HIP ARTHROPLASTY ANTERIOR APPROACH;  Surgeon: Lonni CINDERELLA Poli, MD;  Location: [facility] OR;  Service: Orthopedics;  Laterality: Left;    Social History   Tobacco Use   Smoking status: Never   Smokeless tobacco: Never  Vaping Use   Vaping status: Never Used  Substance Use Topics   Alcohol use: No   Drug use: No    Family History  Problem Relation Age of Onset   Heart  disease Father    Multiple sclerosis Father    Hypertension Mother    Hyperthyroidism Mother    Diabetes Mother    Heart attack Paternal Grandfather    Heart disease Paternal Grandfather    Diabetes Cousin        Multiple maternal cousins with type 2 diabetes mellitus   Diabetes Maternal Uncle        Type 1 diabetes mellitus   Healthy Daughter    Asthma Son    Diabetes Son     PE: Vitals:   08/02/24 1438 08/02/24 1908 08/02/24 2232 08/02/24 2330  BP: (!) 123/51 (!) 136/47 (!) 126/51 (!) 115/48  Pulse: 93 82 84 80  Resp: 16  17 17 18   Temp: 98.2 F (36.8 C) 97.9 F (36.6 C) 97.9 F (36.6 C)   TempSrc:  Oral Oral   SpO2: 99% 100% 100% 100%   Patient appears to be in no acute distress  Respiratory: Normal breathing room air Cardiovascular: Regular rate and rhythm per monitor Abdomen: Soft mildly tender GU: No catheter in place  Recent Labs    08/02/24 1711 08/02/24 1730  WBC  --  11.6*  HGB 10.9* 9.8*  HCT 32.0* 29.1*   Recent Labs    08/02/24 1711 08/02/24 1730  NA 133* 133*  K 3.0* 2.9*  CL 96* 92*  CO2  --  25  GLUCOSE 280* 291*  BUN 80* 89*  CREATININE 2.00* 2.08*  CALCIUM   --  9.5   No results for input(s): LABPT, INR in the last 72 hours. No results for input(s): LABURIN in the last 72 hours. Results for orders placed or performed in visit on 07/30/24  Microscopic Examination     Status: Abnormal   Collection Time: 07/30/24  1:56 PM   Urine  Result Value Ref Range Status   WBC, UA 6-10 (A) 0 - 5 /hpf Final   RBC, Urine 11-30 (A) 0 - 2 /hpf Final   Epithelial Cells (non renal) None seen 0 - 10 /hpf Final   Renal Epithel, UA None seen None seen /hpf Final   Bacteria, UA Many (A) None seen/Few Final   Yeast, UA None seen None seen Final    Imaging: IMPRESSION: 1. Bladder with dependent hyperdense material consistent with thrombus; no definite mass identified.  There is also some mild right hydronephrosis, though the right ureter is  always been slightly more distended even going back to 2015 CT.  Imp: 73 year old female with gross hematuria suspected etiology either UTI versus malignancy.  Patient will need outpatient workup for malignancy for the time being a catheter was placed patient was placed on CBI patient remained on CBI overnight CBI instructions were given to the nursing team.  Hold any anticoagulation  Urology will follow-up in the morning.   Thank you for involving me in this patient's care, I will continue to follow along.Please page with any further questions or concerns. Steffan JAYSON Pea

## 2024-08-03 NOTE — ED Notes (Addendum)
 This nurse was told to keep pt on light CBI per Dr. Shane.

## 2024-08-03 NOTE — ED Notes (Signed)
 CBG 247.  PT MEAL TABLE PREPARED.

## 2024-08-03 NOTE — ED Notes (Signed)
 Pt on CBI- urine is now clear pinkish in color

## 2024-08-03 NOTE — Progress Notes (Signed)
 Orthopedic Tech Progress Note Patient Details:  Bridget Martinez 22-Aug-1951 [id number]  Ortho Devices Type of Ortho Device: Lumbar corsett Ortho Device/Splint Location: Back Ortho Device/Splint Interventions: Ordered, Adjustment   Post Interventions Patient Tolerated: Well  Shepherd Finnan A Brieann Osinski 08/03/2024, 9:21 AM

## 2024-08-03 NOTE — Plan of Care (Signed)
  Problem: Clinical Measurements: Goal: Diagnostic test results will improve Outcome: Progressing   Problem: Elimination: Goal: Will not experience complications related to urinary retention Outcome: Progressing   Problem: Pain Managment: Goal: General experience of comfort will improve and/or be controlled Outcome: Progressing   Problem: Safety: Goal: Ability to remain free from injury will improve Outcome: Progressing   Problem: Skin Integrity: Goal: Risk for impaired skin integrity will decrease Outcome: Progressing

## 2024-08-03 NOTE — H&P (Addendum)
 History and Physical    Bridget Martinez FMW:[id number] DOB: November 10, 1950 DOA: 08/02/2024  PCP: Severa [name], FNP Patient coming from: Home  Chief Complaint: hematuria, generalized weakness  HPI: Bridget Martinez is a 73 y.o. female with medical history significant of type 2 diabetes with hyperglycemia, CKD stage IV, chronic combined systolic diastolic heart failure, anemia, GERD, MI,  gastroparesis, hypertension, hypothyroidism and hyperlipidemia who presents the hospital with complaint of hematuria,generalized weakness and anorexia. The patient states that for about 1 week she was having increasing urination and back pain. She presented to her primary care physician on Friday. She was started on oral antibiotics at that time. She started taking at hme but then patient started to have hematuria. Her daughter therefore brought her to the hospital for further evaluation and treatment.  ED Course:  In the ER, BP 97/44, HR 86, RR 14, O2 saturation 100% on RA, and Tmax 98.2. Cbc demonstrated wbc 11.6,  hb/hct 9.8/29.1, and platelet 406. Chemistry demonstrated Na 133, K 2.9, Cl 92, bicarb 25, Bun/Cr 89/2.08 and glucose 291. Anion gap 16.  Ct renal stone demonstrated bladder with dependent hyperdense material consistent with thrombus; no definite mass identified.  Lumbar spine x-ray demonstrated moderate compression deformity of L2 which has progressed compared to prior study.  Diffuse osteopenia, mild generative changes throughout the lumbar spine.  EKG: LBBB with ST depressions in 2 3 and aVF.  QTc 514.   Review of Systems:  All systems reviewed and apart from history of presenting illness, are negative.  Past Medical History:  Diagnosis Date   Arthritis    CHF (congestive heart failure) (HCC)    Coronary artery disease    angina.  MI.    Depression    anxiety   Diabetes mellitus    on insulin , with h/o DKA    GERD (gastroesophageal reflux disease)    Hiatal hernia.    HTN (hypertension)     Hyperlipidemia    Hypothyroidism    Left displaced femoral neck fracture (HCC) 11/19/2014   Myocardial infarction [facility])    NSTEMI 07/2011 with cardiogenic shock, s/p CABG 09/2011   Osteoporosis    Pneumonia        Tuberculosis    Venous stasis ulcers (HCC)     Past Surgical History:  Procedure Laterality Date   CARDIAC CATHETERIZATION     COLONOSCOPY N/A 11/18/2013   Procedure: COLONOSCOPY;  Surgeon: Princella HERO Nida, MD;  Location: [facility] ENDOSCOPY;  Service: Endoscopy;  Laterality: N/A;   CORONARY ARTERY BYPASS GRAFT     Dr. Fleeta Trigt in 09/2011    CORONARY ARTERY BYPASS GRAFT  2012   ESOPHAGOGASTRODUODENOSCOPY N/A 11/17/2013   Procedure: ESOPHAGOGASTRODUODENOSCOPY (EGD);  Surgeon: Princella HERO Nida, MD;  Location: [facility] ENDOSCOPY;  Service: Endoscopy;  Laterality: N/A;   LEFT HEART CATHETERIZATION WITH CORONARY ANGIOGRAM N/A 07/26/2011   Procedure: LEFT HEART CATHETERIZATION WITH CORONARY ANGIOGRAM;  Surgeon: Ezra GORMAN Shuck, MD;  Location: [facility] CATH LAB;  Service: Cardiovascular;  Laterality: N/A;   RIGHT HEART CATHETERIZATION N/A 07/29/2011   Procedure: RIGHT HEART CATH;  Surgeon: Lynwood Schilling, MD;  Location: [facility] CATH LAB;  Service: Cardiovascular;  Laterality: N/A;   TONSILLECTOMY     TOTAL HIP ARTHROPLASTY Left 11/21/2014   Procedure: TOTAL HIP ARTHROPLASTY ANTERIOR APPROACH;  Surgeon: Lonni CINDERELLA Poli, MD;  Location: [facility] OR;  Service: Orthopedics;  Laterality: Left;     reports that she has never smoked. She has never used smokeless tobacco. She reports that she  does not drink alcohol and does not use drugs.  Allergies  Allergen Reactions   Penicillins Anaphylaxis and Hives    Tolerates zosyn  Has patient had a PCN reaction causing immediate rash, facial/tongue/throat swelling, SOB or lightheadedness with hypotension: Yes Has patient had a PCN reaction causing severe rash involving mucus membranes or skin necrosis: No Has patient had a PCN reaction that required hospitalization:  Yes Has patient had a PCN reaction occurring within the last 10 years: No If all of the above answers are NO, then may proceed with Cephalosporin use.    Sulfa Antibiotics Anaphylaxis, Other (See Comments) and Swelling    Stiff neck also   Macrobid  [Nitrofurantoin  Monohyd Macro] Other (See Comments)    Skin peels off   Ciprofloxacin  Hcl Other (See Comments)    Skin peeling    Family History  Problem Relation Age of Onset   Heart disease Father    Multiple sclerosis Father    Hypertension Mother    Hyperthyroidism Mother    Diabetes Mother    Heart attack Paternal Grandfather    Heart disease Paternal Grandfather    Diabetes Cousin        Multiple maternal cousins with type 2 diabetes mellitus   Diabetes Maternal Uncle        Type 1 diabetes mellitus   Healthy Daughter    Asthma Son    Diabetes Son     Prior to Admission medications   Medication Sig Start Date End Date Taking? Authorizing Provider  aspirin  81 MG EC tablet Take 1 tablet (81 mg total) by mouth daily. 12/13/16  Yes [name], Toribio SAUNDERS, MD  cephALEXin  (KEFLEX ) 500 MG capsule Take 1 capsule (500 mg total) by mouth 3 (three) times daily for 7 days. 07/30/24 08/06/24 Yes [name], [name], FNP  furosemide  (LASIX ) 20 MG tablet Take 20 mg by mouth every Thursday. Take one tablet (20mg ) by mouth weekly 12/12/23  Yes [provider]  insulin  aspart (NOVOLOG  FLEXPEN) 100 UNIT/ML FlexPen Inject 9-16 Units into the skin 3 (three) times daily with meals. Take 9 units at breakfast, 16 units at lunch, 14 units at bedtime 09/21/19  Yes [provider]  LANTUS  SOLOSTAR 100 UNIT/ML Solostar Pen Inject 8 Units into the skin 2 (two) times daily. 09/23/23  Yes [provider]  levothyroxine  (SYNTHROID ) 75 MCG tablet Take 75 mcg by mouth daily before breakfast.   Yes [provider]  ondansetron  (ZOFRAN ) 4 MG tablet Take 4 mg by mouth every 8 (eight) hours as needed. 10/03/23  Yes [provider]   traMADol  (ULTRAM ) 50 MG tablet Take 1 tablet (50 mg total) by mouth 2 (two) times daily as needed for moderate pain (pain score 4-6). 07/30/24  Yes [name], [name], FNP  buPROPion  (WELLBUTRIN  XL) 150 MG 24 hr tablet Take 1 tablet (150 mg total) by mouth 2 (two) times daily. Patient not taking: Reported on 08/03/2024 07/30/24   Severa [name], FNP  carvedilol  (COREG ) 12.5 MG tablet Take 1 tablet (12.5 mg total) by mouth 2 (two) times daily with a meal. PLEASE SCHEDULE APPOINTMENT FOR MORE REFILLS Patient not taking: No sig reported 07/06/24   [name], Toribio SAUNDERS, MD  epoetin  alfa (EPOGEN ) 3000 UNIT/ML injection Inject into the skin. 02/20/22   [provider]  ferrous sulfate  325 (65 FE) MG EC tablet TAKE 1 TABLET BY MOUTH EVERY OTHER DAY Patient not taking: Reported on 08/03/2024 07/19/24   Davonna Siad, MD  metoCLOPramide  (REGLAN ) 5 MG  tablet TAKE 1 TABLET BY MOUTH 3 TIMES DAILY BEFORE MEALS. 05/04/24   Severa [name], FNP  pantoprazole  (PROTONIX ) 40 MG tablet TAKE 1 TABLET BY MOUTH EVERY DAY 07/19/24   Severa [name], FNP  rosuvastatin  (CRESTOR ) 40 MG tablet Take 1 tablet (40 mg total) by mouth daily. Patient not taking: Reported on 08/03/2024 07/30/24   Severa [name], FNP  sodium bicarbonate  650 MG tablet Take 650 mg by mouth 3 (three) times daily. 02/16/22   [provider]    Physical Exam: Vitals:   08/02/24 2232 08/02/24 2330 08/03/24 0015 08/03/24 0123  BP: (!) 126/51 (!) 115/48 (!) 108/54   Pulse: 84 80 84   Resp: 17 18    Temp: 97.9 F (36.6 C)   98.2 F (36.8 C)  TempSrc: Oral   Oral  SpO2: 100% 100% 100%     Physical Exam Constitutional:      General: He is not in acute distress.    Appearance: Normal appearance.  HENT:     Head: Normocephalic and atraumatic.  Eyes:     Extraocular Movements: Extraocular movements intact.     Conjunctiva/sclera: Conjunctivae normal.     Pupils: Pupils are equal, round, and reactive to light.  Cardiovascular:      Rate and Rhythm: Normal rate and regular rhythm.     Pulses: Normal pulses.     Heart sounds: Normal heart sounds.  Pulmonary:     Effort: Pulmonary effort is normal. No respiratory distress.     Breath sounds: Normal breath sounds. No wheezing, rhonchi or rales.  Abdominal:     General: Abdomen is flat. Bowel sounds are normal. There is no distension.     Palpations: Abdomen is soft.     Tenderness: There is no abdominal tenderness.  Musculoskeletal:        General: No deformity. Normal range of motion.  Skin:    General: Skin is warm and dry.     Coloration: Skin is not jaundiced.  Neurological:     General: No focal deficit present.     Mental Status: He is alert and oriented to person, place, and time. Mental status is at baseline.   Labs on Admission: I have personally reviewed following labs and imaging studies  CBC: Recent Labs  Lab 07/29/24 1319 08/02/24 1711 08/02/24 1730  WBC 14.3*  --  11.6*  NEUTROABS 11.8*  --   --   HGB 11.1* 10.9* 9.8*  HCT 33.2* 32.0* 29.1*  MCV 87.4  --  87.1  PLT 319  --  406*   Basic Metabolic Panel: Recent Labs  Lab 08/02/24 1711 08/02/24 1730  NA 133* 133*  K 3.0* 2.9*  CL 96* 92*  CO2  --  25  GLUCOSE 280* 291*  BUN 80* 89*  CREATININE 2.00* 2.08*  CALCIUM   --  9.5   GFR: CrCl cannot be calculated (Unknown ideal weight.). Liver Function Tests: Recent Labs  Lab 08/02/24 1730  AST 20  ALT 23  ALKPHOS 160*  BILITOT 0.8  PROT 7.3  ALBUMIN  3.3*   No results for input(s): LIPASE, AMYLASE in the last 168 hours. No results for input(s): AMMONIA in the last 168 hours. Coagulation Profile: No results for input(s): INR, PROTIME in the last 168 hours. Cardiac Enzymes: No results for input(s): CKTOTAL, CKMB, CKMBINDEX, TROPONINI in the last 168 hours. BNP (last 3 results) No results for input(s): PROBNP in the last 8760 hours. HbA1C: No results for input(s): HGBA1C in  the last 72 hours. CBG: No  results for input(s): GLUCAP in the last 168 hours. Lipid Profile: No results for input(s): CHOL, HDL, LDLCALC, TRIG, CHOLHDL, LDLDIRECT in the last 72 hours. Thyroid  Function Tests: No results for input(s): TSH, T4TOTAL, FREET4, T3FREE, THYROIDAB in the last 72 hours. Anemia Panel: No results for input(s): VITAMINB12, FOLATE, FERRITIN, TIBC, IRON, RETICCTPCT in the last 72 hours. Urine analysis:    Component Value Date/Time   COLORURINE RED (A) 08/02/2024 2102   APPEARANCEUR TURBID (A) 08/02/2024 2102   APPEARANCEUR Cloudy (A) 07/30/2024 1356   LABSPEC  08/02/2024 2102    TEST NOT REPORTED DUE TO COLOR INTERFERENCE OF URINE PIGMENT   PHURINE  08/02/2024 2102    TEST NOT REPORTED DUE TO COLOR INTERFERENCE OF URINE PIGMENT   GLUCOSEU (A) 08/02/2024 2102    TEST NOT REPORTED DUE TO COLOR INTERFERENCE OF URINE PIGMENT   HGBUR (A) 08/02/2024 2102    TEST NOT REPORTED DUE TO COLOR INTERFERENCE OF URINE PIGMENT   BILIRUBINUR (A) 08/02/2024 2102    TEST NOT REPORTED DUE TO COLOR INTERFERENCE OF URINE PIGMENT   BILIRUBINUR Negative 07/30/2024 1356   KETONESUR (A) 08/02/2024 2102    TEST NOT REPORTED DUE TO COLOR INTERFERENCE OF URINE PIGMENT   PROTEINUR (A) 08/02/2024 2102    TEST NOT REPORTED DUE TO COLOR INTERFERENCE OF URINE PIGMENT   UROBILINOGEN negative 10/03/2015 1154   UROBILINOGEN 0.2 03/06/2015 2158   NITRITE (A) 08/02/2024 2102    TEST NOT REPORTED DUE TO COLOR INTERFERENCE OF URINE PIGMENT   LEUKOCYTESUR (A) 08/02/2024 2102    TEST NOT REPORTED DUE TO COLOR INTERFERENCE OF URINE PIGMENT    Radiological Exams on Admission: CT Renal Stone Study Result Date: 08/02/2024 EXAM: CT ABDOMEN AND PELVIS WITHOUT CONTRAST 08/02/2024 09:45:34 PM TECHNIQUE: CT of the abdomen and pelvis was performed without the administration of intravenous contrast. Multiplanar reformatted images are provided for review. Automated exposure control, iterative  reconstruction, and/or weight-based adjustment of the mA/kV was utilized to reduce the radiation dose to as low as reasonably achievable. COMPARISON: 07/30/2014 CLINICAL HISTORY: Back pain and weakness, hematuria. FINDINGS: LOWER CHEST: Lung bases are clear. LIVER: The liver is within normal limits. GALLBLADDER AND BILE DUCTS: The gallbladder is within normal limits. No biliary ductal dilatation. SPLEEN: The spleen is unremarkable. PANCREAS: The pancreas is unremarkable. ADRENAL GLANDS: The adrenal glands are unremarkable. KIDNEYS, URETERS AND BLADDER: Kidneys are well visualized bilaterally. No renal calculi are seen. Fullness of the right renal pelvis is noted although no ureteral dilatation is seen. Obstructive changes are noted on the left. The bladder is well distended with diffuse hyperdense material in the dependent portion. This likely represents thrombus consistent with the given clinical history. No definitive mass is seen. GI AND BOWEL: Stomach demonstrates no acute abnormality. There is no bowel obstruction. PERITONEUM AND RETROPERITONEUM: No ascites. No free air. VASCULATURE: Aorta is normal in caliber. LYMPH NODES: No lymphadenopathy. REPRODUCTIVE ORGANS: The uterus is within normal limits. BONES AND SOFT TISSUES: No acute osseous abnormality. No focal soft tissue abnormality. IMPRESSION: 1. Bladder with dependent hyperdense material consistent with thrombus; no definite mass identified. Electronically signed by: Oneil Devonshire MD 08/02/2024 09:52 PM EST RP Workstation: [id number]   DG Lumbar Spine Complete Result Date: 08/02/2024 CLINICAL DATA:  Back pain after fall EXAM: DG LUMBAR SPINE COMPLETE 4+V COMPARISON:  Lumbar spine x-ray 05/01/2024 FINDINGS: The bones are diffusely osteopenic. There is moderate compression deformity of L2 which has progressed compared to the prior  study. No additional fractures are seen. Alignment is anatomic. There is mild disc space narrowing and endplate osteophyte  formation throughout the lumbar spine compatible with degenerative change. Left hip arthroplasty is present. IMPRESSION: 1. Moderate compression deformity of L2 which has progressed compared to the prior study. Correlate clinically for acute on chronic fracture. 2. Diffuse osteopenia. 3. Mild degenerative changes throughout the lumbar spine. Electronically Signed   By: Greig Pique M.D.   On: 08/02/2024 18:28    EKG: Independently reviewed.   Assessment/Plan Principal Problem:   Hematuria Active Problems:   Acute cystitis   Hypertension associated with diabetes (HCC)   CKD (chronic kidney disease) stage 4, GFR 15-29 ml/min (HCC)   Chronic combined systolic and diastolic congestive heart failure (HCC)   Acquired hypothyroidism   History of non-ST elevation myocardial infarction (NSTEMI)    Hematuria Secondary to uti vs thrombus in bladder Urology was consulted to see the patient They have started her on CBI at this time Will continue to monitor  Acute cystitis Patient has been started on rocephin   Normocytic anemia Hemoglobin is 10.9 and repeat 9.8 Will continue to trend H/H at this time Will transfuse for hb less than 7  DM type 2 Will start on lantus   Will also place on sliding scale insulin   Hypertension Will restart home meds  Hypokalemia K was 2.9  She had 40 meq replacement Magnesium  level has been ordered at this time  Hyponatremia Na is 133 Will give fluids and will repeat bmp Continue to monitor   Combined systolic and diastolic heart failure Will continue to monitor fluid At this time patient is not overloaded She appears dehydrated Hold diuretics  Blood pressure is also soft Will hold off on antihypertensives   Abnormal EKG  Appears patient had LBBB similar to EKG from 09/19/23  LBBB appeared more pronounced St depression in II, III and AvF??? This was discussed with cardiology  Thought that given her history and prior EKG this could be  chronic No chest pain  Troponins were ordered  Qtc is prolonged 512 Will avoid QT prolonging medications  Cardiology consulted to see the patient   DVT prophylaxis: scds  Code Status: Full  Family Communication:   Disposition Plan:  Patient class is not currently inpatient or observation. Update patient class prior to completing documentation    Consults called:  cardiology, urology  Admission status: inpatient  Level of care: Level of care:  The medical decision making on this patient was of high complexity and the patient is at high risk for clinical deterioration, therefore this is a level 3 visit.  The medical decision making is of moderate complexity, therefore this is a level 2 visit.  Bradly MARLA Drones MD [facility]  If 7PM-7AM, please contact night-coverage [email or web address]  08/03/2024, 1:35 AM

## 2024-08-04 DIAGNOSIS — E876 Hypokalemia: Secondary | ICD-10-CM | POA: Diagnosis not present

## 2024-08-04 DIAGNOSIS — N3001 Acute cystitis with hematuria: Secondary | ICD-10-CM

## 2024-08-04 DIAGNOSIS — N189 Chronic kidney disease, unspecified: Secondary | ICD-10-CM | POA: Diagnosis not present

## 2024-08-04 LAB — BASIC METABOLIC PANEL WITH GFR
Anion gap: 11 (ref 5–15)
BUN: 43 mg/dL — ABNORMAL HIGH (ref 8–23)
CO2: 24 mmol/L (ref 22–32)
Calcium: 8.5 mg/dL — ABNORMAL LOW (ref 8.9–10.3)
Chloride: 100 mmol/L (ref 98–111)
Creatinine, Ser: 1.57 mg/dL — ABNORMAL HIGH (ref 0.44–1.00)
GFR, Estimated: 34 mL/min — ABNORMAL LOW (ref >60)
Glucose, Bld: 254 mg/dL — ABNORMAL HIGH (ref 70–99)
Potassium: 3.4 mmol/L — ABNORMAL LOW (ref 3.5–5.1)
Sodium: 135 mmol/L (ref 135–145)

## 2024-08-04 LAB — CBC WITH DIFFERENTIAL/PLATELET
Abs Immature Granulocytes: 0.12 K/uL — ABNORMAL HIGH (ref 0.00–0.07)
Basophils Absolute: 0.1 K/uL (ref 0.0–0.1)
Basophils Relative: 1 %
Eosinophils Absolute: 0.2 K/uL (ref 0.0–0.5)
Eosinophils Relative: 2 %
HCT: 27.5 % — ABNORMAL LOW (ref 36.0–46.0)
Hemoglobin: 8.9 g/dL — ABNORMAL LOW (ref 12.0–15.0)
Immature Granulocytes: 1 %
Lymphocytes Relative: 29 %
Lymphs Abs: 2.8 K/uL (ref 0.7–4.0)
MCH: 29 pg (ref 26.0–34.0)
MCHC: 32.4 g/dL (ref 30.0–36.0)
MCV: 89.6 fL (ref 80.0–100.0)
Monocytes Absolute: 1 K/uL (ref 0.1–1.0)
Monocytes Relative: 10 %
Neutro Abs: 5.3 K/uL (ref 1.7–7.7)
Neutrophils Relative %: 57 %
Platelets: 361 K/uL (ref 150–400)
RBC: 3.07 MIL/uL — ABNORMAL LOW (ref 3.87–5.11)
RDW: 18.5 % — ABNORMAL HIGH (ref 11.5–15.5)
WBC: 9.5 K/uL (ref 4.0–10.5)
nRBC: 0 % (ref 0.0–0.2)

## 2024-08-04 LAB — URINE CULTURE: Culture: NO GROWTH

## 2024-08-04 LAB — GLUCOSE, CAPILLARY
Glucose-Capillary: 394 mg/dL — ABNORMAL HIGH (ref 70–99)
Glucose-Capillary: 366 mg/dL — ABNORMAL HIGH (ref 70–99)
Glucose-Capillary: 337 mg/dL — ABNORMAL HIGH (ref 70–99)

## 2024-08-04 LAB — MAGNESIUM: Magnesium: 2.4 mg/dL (ref 1.7–2.4)

## 2024-08-04 MED ORDER — INSULIN GLARGINE-YFGN 100 UNIT/ML ~~LOC~~ SOLN
8.0000 [IU] | Freq: Two times a day (BID) | SUBCUTANEOUS | Status: DC
  Administered 2024-08-04 (×2): 8 [IU] via SUBCUTANEOUS
  Filled 2024-08-04 (×4): qty 0.08

## 2024-08-04 MED ORDER — ROSUVASTATIN CALCIUM 20 MG PO TABS
40.0000 mg | ORAL_TABLET | Freq: Every day | ORAL | Status: DC
  Administered 2024-08-04 – 2024-08-07 (×4): 40 mg via ORAL
  Filled 2024-08-04 (×4): qty 2

## 2024-08-04 MED ORDER — CEFUROXIME AXETIL 250 MG PO TABS
250.0000 mg | ORAL_TABLET | Freq: Two times a day (BID) | ORAL | Status: DC
  Administered 2024-08-05 – 2024-08-07 (×5): 250 mg via ORAL
  Filled 2024-08-04 (×6): qty 1

## 2024-08-04 MED ORDER — POTASSIUM CHLORIDE CRYS ER 20 MEQ PO TBCR
40.0000 meq | EXTENDED_RELEASE_TABLET | Freq: Once | ORAL | Status: AC
  Administered 2024-08-04: 40 meq via ORAL
  Filled 2024-08-04: qty 2

## 2024-08-04 MED ORDER — ZINC OXIDE 40 % EX OINT
TOPICAL_OINTMENT | Freq: Two times a day (BID) | CUTANEOUS | Status: DC
  Filled 2024-08-04 (×2): qty 57

## 2024-08-04 NOTE — Evaluation (Addendum)
 Occupational Therapy Evaluation Patient Details Name: Bridget Martinez MRN: [id number] DOB: December 16, 1950 Today's Date: 08/04/2024   History of Present Illness   Patient is a 73 y.o.  female presented with hematuria in the setting of UTI.  x-ray lumbar spine: Moderate compression deformity-L2. Past medical history of stage IV CKD, CAD s/p CABG, DM-2, hypothyroidism, chronic sacral decubitus ulcer.     Clinical Impressions Pt greeted in bed, agreeable for OT visit. Supportive DIL at bedside, assisting with providing accurate PLOF. Pt is a poor historian, but overall participatory. She is significantly fearful of falling. PTA, she has been needing incr assist with BADLs and mobility with RW (uses w/c for community distances). Today, she presents with generalized weakness and reduced activity tolerance. Functionally, she was min A for STS and stepping to chair with RW; needs up to max A for UB ADLs and total A for LB ADLs. Limited by back pain and decr flexibility. VSS despite transient lightheadedness - see BP measurements below.   Pt is currently functioning below baseline and would benefit from ongoing acute OT services to progress towards safe discharge and to facilitate return to prior level of function. Current recommendation is post-acute rehab (< 3 hours/day).  Supine BP: 139/39 (64) Seated EOB BP: 129/71 (87) Sitting in chair BP: 125/45 (67)     If plan is discharge home, recommend the following:   A lot of help with walking and/or transfers;A lot of help with bathing/dressing/bathroom;Assistance with cooking/housework;Direct supervision/assist for medications management;Direct supervision/assist for financial management;Assist for transportation;Help with stairs or ramp for entrance;Supervision due to cognitive status     Functional Status Assessment   Patient has had a recent decline in their functional status and demonstrates the ability to make significant improvements in  function in a reasonable and predictable amount of time.     Equipment Recommendations   Other (comment) (defer)     Recommendations for Other Services         Precautions/Restrictions   Precautions Precautions: Fall;Back Precaution Booklet Issued: No (verbally reviewed) Recall of Precautions/Restrictions: Intact Precaution/Restrictions Comments: great fear of falling Required Braces or Orthoses: Spinal Brace Spinal Brace: Lumbar corset;Applied in sitting position;Other (comment) Spinal Brace Comments: for ambulation per active orders Restrictions Weight Bearing Restrictions Per Provider Order: No     Mobility Bed Mobility Overal bed mobility: Needs Assistance Bed Mobility: Rolling, Sidelying to Sit Rolling: Contact guard assist, Used rails Sidelying to sit: Contact guard assist, Used rails       General bed mobility comments: cued through log roll technique, exited to the R side    Transfers Overall transfer level: Needs assistance Equipment used: Rolling walker (2 wheels) Transfers: Sit to/from Stand, Bed to chair/wheelchair/BSC Sit to Stand: Min assist, +2 safety/equipment     Step pivot transfers: Min assist     General transfer comment: Stood from bed with VC for hand placement, needs A for sequencing and safe approach to chair. VC to control descent into chair and for proper hand placement.      Balance Overall balance assessment: Needs assistance Sitting-balance support: Bilateral upper extremity supported, Feet supported Sitting balance-Leahy Scale: Fair Sitting balance - Comments: seated EOB, no LOB but reliant on UE's for support   Standing balance support: Bilateral upper extremity supported, During functional activity Standing balance-Leahy Scale: Poor Standing balance comment: reliant on RW, fearful of falling, cues for forward gaze and to relax her posture  ADL either performed or assessed with  clinical judgement   ADL Overall ADL's : Needs assistance/impaired Eating/Feeding: Set up;Sitting   Grooming: Minimal assistance;Sitting   Upper Body Bathing: Minimal assistance   Lower Body Bathing: Maximal assistance   Upper Body Dressing : Maximal assistance Upper Body Dressing Details (indicate cue type and reason): donning LSO brace Lower Body Dressing: Total assistance       Toileting- Clothing Manipulation and Hygiene: Total assistance Toileting - Clothing Manipulation Details (indicate cue type and reason): incontinent of stool, needs total A for posterior peri care/hygiene     Functional mobility during ADLs: Minimal assistance;Rolling walker (2 wheels)       Vision Baseline Vision/History: 0 No visual deficits Ability to See in Adequate Light: 0 Adequate Patient Visual Report: No change from baseline       Perception         Praxis         Pertinent Vitals/Pain Pain Assessment Pain Assessment: 0-10 Pain Score: 6  Pain Location: back Pain Descriptors / Indicators: Discomfort, Grimacing Pain Intervention(s): Limited activity within patient's tolerance, Monitored during session, Repositioned     Extremity/Trunk Assessment Upper Extremity Assessment Upper Extremity Assessment: Generalized weakness   Lower Extremity Assessment Lower Extremity Assessment: Defer to PT evaluation   Cervical / Trunk Assessment Cervical / Trunk Assessment: Kyphotic   Communication Communication Communication: No apparent difficulties   Cognition Arousal: Alert Behavior During Therapy: WFL for tasks assessed/performed, Anxious Cognition: Cognition impaired   Orientation impairments: Situation (vague answer for situation) Awareness: Intellectual awareness impaired Memory impairment (select all impairments): Short-term memory, Working memory Attention impairment (select first level of impairment): Sustained attention Executive functioning impairment (select all  impairments): Initiation, Organization, Sequencing, Problem solving OT - Cognition Comments: pleasant and participatory; limited by fear of falling                 Following commands: Impaired Following commands impaired: Follows one step commands with increased time, Follows multi-step commands inconsistently     Cueing  General Comments   Cueing Techniques: Verbal cues;Tactile cues;Visual cues  dBP low this date, improved with supine LE therapeutic exercises; DIL reporting pt's BP runs on the lower side. Initial supine BP: 139/39 (64), seated EOB BP: 129/71 (87), seated in chair BP: 125/45 (67); pt transiently lightheaded with positional changes, but overall tolerated well   Exercises     Shoulder Instructions      Home Living Family/patient expects to be discharged to:: Private residence Living Arrangements: Alone Available Help at Discharge: Family;Available PRN/intermittently Type of Home: House Home Access: Stairs to enter [employer] of Steps: 4 Entrance Stairs-Rails: Left Home Layout: One level     Bathroom Shower/Tub: Producer, Television/film/video: Standard Bathroom Accessibility: Yes   Home Equipment: Educational Psychologist (4 wheels);BSC/3in1;Wheelchair - manual          Prior Functioning/Environment Prior Level of Function : Needs assist;History of Falls (last six months)       Physical Assist : Mobility (physical);ADLs (physical) Mobility (physical): Stairs ADLs (physical): Bathing;Dressing;IADLs Mobility Comments: RW for household distances since d/c from SNF; has had steady decline with shuffling gait and needing to use w/c more frequently; uses w/c for community distances at baseline ADLs Comments: family has been providing increased assist with BADLs following d/c from SNF    OT Problem List: Decreased strength;Decreased range of motion;Decreased activity tolerance;Impaired balance (sitting and/or standing);Decreased cognition    OT Treatment/Interventions: Self-care/ADL training;Therapeutic exercise;Therapeutic activities;Cognitive remediation/compensation;Patient/family education;Balance training;Energy  conservation;DME and/or AE instruction      OT Goals(Current goals can be found in the care plan section)   Acute Rehab OT Goals Patient Stated Goal: get better OT Goal Formulation: With patient/family Time For Goal Achievement: 08/18/24 Potential to Achieve Goals: Good   OT Frequency:  Min 2X/week    Co-evaluation              AM-PAC OT 6 Clicks Daily Activity     Outcome Measure Help from another person eating meals?: A Little Help from another person taking care of personal grooming?: A Little Help from another person toileting, which includes using toliet, bedpan, or urinal?: Total Help from another person bathing (including washing, rinsing, drying)?: A Lot Help from another person to put on and taking off regular upper body clothing?: A Lot Help from another person to put on and taking off regular lower body clothing?: Total 6 Click Score: 12   End of Session Equipment Utilized During Treatment: Gait belt;Rolling walker (2 wheels);Back brace Nurse Communication: Mobility status;Other (comment) (pt needs new mepilex on buttock wound following bowel incontinence)  Activity Tolerance: Patient tolerated treatment well Patient left: in chair;with call bell/phone within reach;with chair alarm set;with family/visitor present  OT Visit Diagnosis: Unsteadiness on feet (R26.81);History of falling (Z91.81);Other symptoms and signs involving cognitive function;Muscle weakness (generalized) (M62.81)                Time: 8867-8799 OT Time Calculation (min): 28 min Charges:  OT General Charges $OT Visit: 1 Visit OT Evaluation $OT Eval Moderate Complexity: 1 Mod  Ayodeji Keimig M. [place], OTR/L [facility] [phone] Secure Chat Preferred  Bridget Martinez 08/04/2024, 4:00  PM

## 2024-08-04 NOTE — Progress Notes (Signed)
 Subjective: Patent doing well, urine clear, no pain. Labs improving.   Objective: Vital signs in last 24 hours: Temp:  [97.4 F (36.3 C)-98.2 F (36.8 C)] 97.5 F (36.4 C) (11/26 0400) Pulse Rate:  [67-89] 70 (11/26 0400) Resp:  [12-18] 15 (11/26 0400) BP: (103-122)/(30-52) 118/34 (11/26 0400) SpO2:  [94 %-100 %] 100 % (11/26 0400)  Intake/Output from previous day: 11/25 0701 - 11/26 0700 In: -  Out: 2250 [Urine:2250] Intake/Output this shift: No intake/output data recorded.  Physical Exam:  General: Alert and oriented CV: RRR Lungs: Clear Abdomen: Soft, ND, ATTP GU: catheter in place draining clear yellow urine   Lab Results: Recent Labs    08/03/24 0112 08/03/24 0219 08/04/24 0219  HGB NURSE WILL PUT ORDER BACK IN 08/03/2024 0158 8.2* 8.9*  HCT NURSE WILL PUT ORDER BACK IN 08/03/2024 0158 25.2* 27.5*   BMET Recent Labs    08/03/24 0112 08/04/24 0219  NA 134* 135  K 3.5 3.4*  CL 98 100  CO2 20* 24  GLUCOSE 300* 254*  BUN 75* 43*  CREATININE 1.82* 1.57*  CALCIUM  8.8* 8.5*     Studies/Results: CT Renal Stone Study Result Date: 08/02/2024 EXAM: CT ABDOMEN AND PELVIS WITHOUT CONTRAST 08/02/2024 09:45:34 PM TECHNIQUE: CT of the abdomen and pelvis was performed without the administration of intravenous contrast. Multiplanar reformatted images are provided for review. Automated exposure control, iterative reconstruction, and/or weight-based adjustment of the mA/kV was utilized to reduce the radiation dose to as low as reasonably achievable. COMPARISON: 07/30/2014 CLINICAL HISTORY: Back pain and weakness, hematuria. FINDINGS: LOWER CHEST: Lung bases are clear. LIVER: The liver is within normal limits. GALLBLADDER AND BILE DUCTS: The gallbladder is within normal limits. No biliary ductal dilatation. SPLEEN: The spleen is unremarkable. PANCREAS: The pancreas is unremarkable. ADRENAL GLANDS: The adrenal glands are unremarkable. KIDNEYS, URETERS AND BLADDER: Kidneys  are well visualized bilaterally. No renal calculi are seen. Fullness of the right renal pelvis is noted although no ureteral dilatation is seen. Obstructive changes are noted on the left. The bladder is well distended with diffuse hyperdense material in the dependent portion. This likely represents thrombus consistent with the given clinical history. No definitive mass is seen. GI AND BOWEL: Stomach demonstrates no acute abnormality. There is no bowel obstruction. PERITONEUM AND RETROPERITONEUM: No ascites. No free air. VASCULATURE: Aorta is normal in caliber. LYMPH NODES: No lymphadenopathy. REPRODUCTIVE ORGANS: The uterus is within normal limits. BONES AND SOFT TISSUES: No acute osseous abnormality. No focal soft tissue abnormality. IMPRESSION: 1. Bladder with dependent hyperdense material consistent with thrombus; no definite mass identified. Electronically signed by: Oneil Devonshire MD 08/02/2024 09:52 PM EST RP Workstation: [id number]   DG Lumbar Spine Complete Result Date: 08/02/2024 CLINICAL DATA:  Back pain after fall EXAM: DG LUMBAR SPINE COMPLETE 4+V COMPARISON:  Lumbar spine x-ray 05/01/2024 FINDINGS: The bones are diffusely osteopenic. There is moderate compression deformity of L2 which has progressed compared to the prior study. No additional fractures are seen. Alignment is anatomic. There is mild disc space narrowing and endplate osteophyte formation throughout the lumbar spine compatible with degenerative change. Left hip arthroplasty is present. IMPRESSION: 1. Moderate compression deformity of L2 which has progressed compared to the prior study. Correlate clinically for acute on chronic fracture. 2. Diffuse osteopenia. 3. Mild degenerative changes throughout the lumbar spine. Electronically Signed   By: Greig Pique M.D.   On: 08/02/2024 18:28    Assessment/Plan: 16 F GH s/p irrigation urine clear.   Plan  - void  trial today  - pt has been set up for outpatient [facility] evaluation  - treat  proteus with abx (Cx from 07/30/24) - urology to sign off.    LOS: 1 day   Jackey Pea MD 08/04/2024, 7:01 AM [facility]

## 2024-08-04 NOTE — NC FL2 (Signed)
 [place]  MEDICAID FL2 LEVEL OF CARE FORM     IDENTIFICATION  Patient Name: Bridget Martinez Birthdate: May 20, 1951 Sex: female Admission Date (Current Location): 08/02/2024  [facility] and [place] Number:  Producer, Television/film/video and Address:  [facility], [address], [place], [place] [address]      Provider Number: [id number]  Attending Physician Name and Address:  Raenelle Donalda HERO, MD  Relative Name and Phone Number:       Current Level of Care: Hospital Recommended Level of Care: Skilled Nursing Facility Prior Approval Number:    Date Approved/Denied:   PASRR Number: [id number]  Discharge Plan: SNF    Current Diagnoses: Patient Active Problem List   Diagnosis Date Noted   Hematuria 08/03/2024   Acute cystitis 08/03/2024   Nausea without vomiting 11/12/2023   History of tuberculosis 11/10/2020   Osteoporosis 11/10/2020   Anemia in chronic kidney disease 09/09/2019   Diabetic retinopathy (HCC) 01/28/2019   Hyperlipidemia due to type 1 diabetes mellitus (HCC) 08/14/2018   Onychomycosis of multiple toenails with type 1 diabetes mellitus (HCC) 08/14/2018   GERD (gastroesophageal reflux disease) 02/10/2017   History of non-ST elevation myocardial infarction (NSTEMI)    Diabetic gastroparesis associated with type 1 diabetes mellitus (HCC)    Chronic combined systolic and diastolic congestive heart failure (HCC) 09/19/2014   Acquired hypothyroidism 09/19/2014   CKD (chronic kidney disease) stage 4, GFR 15-29 ml/min (HCC) 11/11/2013   Hypertension associated with diabetes (HCC)    Heart murmur    CAD, multiple vessel 07/27/2011   Recurrent depression 07/25/2011    Orientation RESPIRATION BLADDER Height & Weight     Self, Time, Situation, Place  Normal Continent Weight:   Height:     BEHAVIORAL SYMPTOMS/MOOD NEUROLOGICAL BOWEL NUTRITION STATUS      Incontinent Diet (See dc summary)  AMBULATORY STATUS COMMUNICATION OF NEEDS Skin    Extensive Assist Verbally PU Stage and Appropriate Care (Stage III on sacrum; Stage I on buttocks; wound on thigh)                       Personal Care Assistance Level of Assistance  Bathing, Feeding, Dressing Bathing Assistance: Maximum assistance Feeding assistance: Independent Dressing Assistance: Limited assistance     Functional Limitations Info             SPECIAL CARE FACTORS FREQUENCY  PT (By licensed PT), OT (By licensed OT)     PT Frequency: 5x/week OT Frequency: 5x/week            Contractures Contractures Info: Not present    Additional Factors Info  Code Status, Allergies Code Status Info: Full Allergies Info: Penicillins, Sulfa Antibiotics, Macrobid  (Nitrofurantoin  Monohyd Macro), Ciprofloxacin  Hcl           Current Medications (08/04/2024):  This is the current hospital active medication list Current Facility-Administered Medications  Medication Dose Route Frequency Provider Last Rate Last Admin   buPROPion  (WELLBUTRIN  XL) 24 hr tablet 150 mg  150 mg Oral BID WC Stephens, Tiona K, MD   150 mg at 08/04/24 0913   [START ON 08/05/2024] cefUROXime  (CEFTIN ) tablet 250 mg  250 mg Oral BID WC Ghimire, Shanker M, MD       ferrous sulfate  tablet 325 mg  325 mg Oral QODAY Stephens, Tiona K, MD   325 mg at 08/03/24 0900   insulin  aspart (novoLOG ) injection 0-6 Units  0-6 Units Subcutaneous TID WC Stephens, Tiona K, MD  6 Units at 08/04/24 1247   insulin  glargine-yfgn (SEMGLEE ) injection 8 Units  8 Units Subcutaneous BID Raenelle Donalda HERO, MD   8 Units at 08/04/24 1247   levothyroxine  (SYNTHROID ) tablet 75 mcg  75 mcg Oral Q0600 Stephens, Tiona K, MD   75 mcg at 08/04/24 0519   liver oil-zinc  oxide (DESITIN) 40 % ointment   Topical BID Raenelle Donalda HERO, MD   Given at 08/04/24 1248   metoCLOPramide  (REGLAN ) tablet 5 mg  5 mg Oral BID AC Stephens, Tiona K, MD   5 mg at 08/04/24 0913   pantoprazole  (PROTONIX ) EC tablet 40 mg  40 mg Oral Daily Stephens,  Tiona K, MD   40 mg at 08/04/24 0913   prochlorperazine  (COMPAZINE ) injection 10 mg  10 mg Intravenous Q4H PRN Stephens, Tiona K, MD       rosuvastatin  (CRESTOR ) tablet 40 mg  40 mg Oral Daily Ghimire, Shanker M, MD   40 mg at 08/04/24 1247   sodium bicarbonate  tablet 650 mg  650 mg Oral TID Stephens, Tiona K, MD   650 mg at 08/04/24 0913   sodium chloride  irrigation 0.9 % 3,000 mL  3,000 mL Irrigation Continuous Showalter, Victor C, MD   3,000 mL at 08/03/24 0320   traMADol  (ULTRAM ) tablet 50 mg  50 mg Oral BID PRN Stephens, Tiona K, MD   50 mg at 08/04/24 1246     Discharge Medications: Please see discharge summary for a list of discharge medications.  Relevant Imaging Results:  Relevant Lab Results:   Additional Information SSN: [id number]  Ceasar Decandia S Jamirra Curnow, LCSW

## 2024-08-04 NOTE — Plan of Care (Signed)

## 2024-08-04 NOTE — Evaluation (Signed)
 Physical Therapy Evaluation Patient Details Name: Bridget Martinez MRN: [id number] DOB: 08-26-1951 Today's Date: 08/04/2024  History of Present Illness  Patient is a 73 y.o.  female presented with hematuria in the setting of UTI.  x-ray lumbar spine: Moderate compression deformity-L2. Past medical history of stage IV CKD, CAD s/p CABG, DM-2, hypothyroidism, chronic sacral decubitus ulcer.  Clinical Impression  Pt presents with admitting diagnosis above. Co-treat with OT. Pt today was able to stand and transfer to chair with Min A RW +2 for safety as pt had a high fear of falling. PTA pt reports ambulating short distances around the house with rollator with assistance from family and using a WC out in the community. Patient will benefit from continued inpatient follow up therapy, <3 hours/day. PT will continue to follow.         If plan is discharge home, recommend the following: A lot of help with walking and/or transfers;A lot of help with bathing/dressing/bathroom;Assistance with cooking/housework;Direct supervision/assist for medications management;Direct supervision/assist for financial management;Assist for transportation;Help with stairs or ramp for entrance;Supervision due to cognitive status   Can travel by private vehicle   No    Equipment Recommendations None recommended by PT  Recommendations for Other Services       Functional Status Assessment Patient has had a recent decline in their functional status and demonstrates the ability to make significant improvements in function in a reasonable and predictable amount of time.     Precautions / Restrictions Precautions Precautions: Fall Recall of Precautions/Restrictions: Intact Precaution/Restrictions Comments: high fear of falling Required Braces or Orthoses: Spinal Brace Spinal Brace: Lumbar corset;Applied in sitting position;Other (comment) (For ambulation only per order set) Restrictions Weight Bearing Restrictions Per  Provider Order: No      Mobility  Bed Mobility Overal bed mobility: Needs Assistance Bed Mobility: Rolling, Sidelying to Sit Rolling: Contact guard assist Sidelying to sit: Contact guard assist       General bed mobility comments: Good logroll. CGA for safety.    Transfers Overall transfer level: Needs assistance Equipment used: Rolling walker (2 wheels) Transfers: Sit to/from Stand, Bed to chair/wheelchair/BSC Sit to Stand: Min assist, +2 safety/equipment   Step pivot transfers: Min assist, +2 safety/equipment       General transfer comment: Min A to stand and step pivot to chair. Pt with a high fear of falling so +2 for safety. Pt able to stand for pericare after being found incontinent of bowels.    Ambulation/Gait               General Gait Details: Deferred  Stairs            Wheelchair Mobility     Tilt Bed    Modified Rankin (Stroke Patients Only)       Balance Overall balance assessment: Needs assistance Sitting-balance support: Bilateral upper extremity supported, Feet supported Sitting balance-Leahy Scale: Good     Standing balance support: Bilateral upper extremity supported, During functional activity Standing balance-Leahy Scale: Poor Standing balance comment: reliant on RW                             Pertinent Vitals/Pain Pain Assessment Pain Assessment: 0-10 Pain Score: 6  Pain Location: back Pain Descriptors / Indicators: Sore, Aching, Grimacing, Discomfort, Guarding Pain Intervention(s): Monitored during session, Repositioned    Home Living Family/patient expects to be discharged to:: Private residence Living Arrangements: Alone Available Help at Discharge: Family;Available PRN/intermittently  Type of Home: House Home Access: Stairs to enter Entrance Stairs-Rails: Left Entrance Stairs-Number of Steps: 4   Home Layout: One level Home Equipment: Educational Psychologist (4 wheels);BSC/3in1;Wheelchair - manual       Prior Function Prior Level of Function : Needs assist;History of Falls (last six months) (1 fall since previous admission)             Mobility Comments: Short distance ambulation around the house with RW. Shuffling gait per family in room. WC outside of the house. ADLs Comments: Family has been assisting with ADLs since DC from SNF     Extremity/Trunk Assessment   Upper Extremity Assessment Upper Extremity Assessment: Defer to OT evaluation    Lower Extremity Assessment Lower Extremity Assessment: Overall WFL for tasks assessed    Cervical / Trunk Assessment Cervical / Trunk Assessment: Kyphotic  Communication   Communication Communication: No apparent difficulties    Cognition Arousal: Alert Behavior During Therapy: WFL for tasks assessed/performed, Anxious                           PT - Cognition Comments: high fear of falling Following commands: Intact       Cueing Cueing Techniques: Verbal cues, Tactile cues     General Comments General comments (skin integrity, edema, etc.): VSS on RA. notified RN that pt needs new mepilex after BM. See OT note for BP.    Exercises     Assessment/Plan    PT Assessment Patient needs continued PT services  PT Problem List Decreased strength;Decreased range of motion;Decreased activity tolerance;Decreased balance;Decreased mobility;Decreased coordination;Decreased safety awareness;Decreased knowledge of use of DME;Decreased knowledge of precautions;Cardiopulmonary status limiting activity       PT Treatment Interventions DME instruction;Gait training;Functional mobility training;Stair training;Therapeutic activities;Therapeutic exercise;Balance training;Neuromuscular re-education;Cognitive remediation;Wheelchair mobility training;Patient/family education    PT Goals (Current goals can be found in the Care Plan section)  Acute Rehab PT Goals Patient Stated Goal: to get better PT Goal Formulation: With  patient Time For Goal Achievement: 08/18/24 Potential to Achieve Goals: Fair    Frequency Min 2X/week     Co-evaluation               AM-PAC PT 6 Clicks Mobility  Outcome Measure Help needed turning from your back to your side while in a flat bed without using bedrails?: A Little Help needed moving from lying on your back to sitting on the side of a flat bed without using bedrails?: A Little Help needed moving to and from a bed to a chair (including a wheelchair)?: A Little Help needed standing up from a chair using your arms (e.g., wheelchair or bedside chair)?: A Little Help needed to walk in hospital room?: A Lot Help needed climbing 3-5 steps with a railing? : Total 6 Click Score: 15    End of Session Equipment Utilized During Treatment: Gait belt;Other (comment) (Lumber corsett) Activity Tolerance: Patient tolerated treatment well Patient left: in chair;with call bell/phone within reach;with family/visitor present Nurse Communication: Mobility status PT Visit Diagnosis: Other abnormalities of gait and mobility (R26.89)    Time: 1130-1201 PT Time Calculation (min) (ACUTE ONLY): 31 min   Charges:   PT Evaluation $PT Eval Moderate Complexity: 1 Mod PT Treatments $Therapeutic Activity: 8-22 mins PT General Charges $$ ACUTE PT VISIT: 1 Visit         Sueellen NOVAK, PT, DPT Acute Rehab Services [phone]   Gayla Benn 08/04/2024, 1:47 PM

## 2024-08-04 NOTE — TOC Initial Note (Signed)
 Transition of Care [facility]) - Initial/Assessment Note    Patient Details  Name: Bridget Martinez MRN: [id number] Date of Birth: 1950-12-17  Transition of [facility]) CM/SW Contact:    Bernardino Dean, LCSWA Phone Number: 08/04/2024, 3:36 PM  Clinical Narrative:                 Patient admits through the ED from home alone for evaluation of UTI, hematuria, poor PO intake and increasing weakness. Seen by PT/OT, rec SNF. SW met with patient at bedside to discuss. Later, called daughter [name]. Patient was at [facility] several months ago. Looking for rehab again, first choice this admission is [facility]. 2nd choice is [facility] in [place], 3rd choice [facility]. FL2 complete and sent to [facility].   Expected Discharge Plan: Skilled Nursing Facility Barriers to Discharge: No SNF bed, Continued Medical Work up, English As A Second Language Teacher   Patient Goals and CMS Choice Patient states their goals for this hospitalization and ongoing recovery are:: Achieve SNF placement at [facility] CMS Medicare.gov Compare Post Acute Care list provided to:: Patient Choice offered to / list presented to : Patient [place] ownership interest in [facility].provided to:: Patient    Expected Discharge Plan and Services In-house Referral: Clinical Social Work   Post Acute Care Choice: Skilled Nursing Facility Living arrangements for the past 2 months: Single Family Home                                      Prior Living Arrangements/Services Living arrangements for the past 2 months: Single Family Home Lives with:: Self Patient language and need for interpreter reviewed:: Yes Do you feel safe going back to the place where you live?: No   Needs rehab first  Need for Family Participation in Patient Care: Yes (Comment) Care giver support system in place?: Yes (comment)   Criminal Activity/Legal Involvement Pertinent to Current Situation/Hospitalization: No - Comment as  needed  Activities of Daily Living   ADL Screening (condition at time of admission) Independently performs ADLs?: Yes (appropriate for developmental age) Is the patient deaf or have difficulty hearing?: No Does the patient have difficulty seeing, even when wearing glasses/contacts?: No Does the patient have difficulty concentrating, remembering, or making decisions?: No  Permission Sought/Granted                  Emotional Assessment Appearance:: Appears stated age Attitude/Demeanor/Rapport: Engaged Affect (typically observed): Appropriate Orientation: : Oriented to Self, Oriented to Place, Oriented to  Time, Oriented to Situation Alcohol / Substance Use: Not Applicable Psych Involvement: No (comment)  Admission diagnosis:  Hypokalemia [E87.6] Gross hematuria [R31.0] Acute cystitis with hematuria [N30.01] Hematuria [R31.9] Chronic kidney disease, unspecified CKD stage [N18.9] Patient Active Problem List   Diagnosis Date Noted   Hematuria 08/03/2024   Acute cystitis 08/03/2024   Nausea without vomiting 11/12/2023   History of tuberculosis 11/10/2020   Osteoporosis 11/10/2020   Anemia in chronic kidney disease 09/09/2019   Diabetic retinopathy (HCC) 01/28/2019   Hyperlipidemia due to type 1 diabetes mellitus (HCC) 08/14/2018   Onychomycosis of multiple toenails with type 1 diabetes mellitus (HCC) 08/14/2018   GERD (gastroesophageal reflux disease) 02/10/2017   History of non-ST elevation myocardial infarction (NSTEMI)    Diabetic gastroparesis associated with type 1 diabetes mellitus (HCC)    Chronic combined systolic and diastolic congestive heart failure (HCC) 09/19/2014   Acquired hypothyroidism  09/19/2014   CKD (chronic kidney disease) stage 4, GFR 15-29 ml/min (HCC) 11/11/2013   Hypertension associated with diabetes (HCC)    Heart murmur    CAD, multiple vessel 07/27/2011   Recurrent depression 07/25/2011   PCP:  Severa [name], FNP Pharmacy:   [facility]  [phone] - [place], [place] - [address] [place] [address] Phone: [phone] Fax: [phone]  [facility] [address] [place] - [address] [place] [address] [place] [address] Phone: [phone] Fax: [phone]     Social Drivers of Health (SDOH) Social History: SDOH Screenings   Food Insecurity: No Food Insecurity (08/03/2024)  Housing: Low Risk  (08/03/2024)  Transportation Needs: No Transportation Needs (08/03/2024)  Utilities: Not At Risk (08/03/2024)  Alcohol Screen: Low Risk  (04/07/2024)  Depression (PHQ2-9): Medium Risk (07/30/2024)  Financial Resource Strain: Low Risk  (04/07/2024)  Physical Activity: Insufficiently Active (04/07/2024)  Social Connections: Socially Isolated (08/03/2024)  Stress: No Stress Concern Present (04/07/2024)  Tobacco Use: Low Risk  (08/02/2024)  Health Literacy: Adequate Health Literacy (04/07/2024)   SDOH Interventions:     Readmission Risk Interventions     No data to display

## 2024-08-04 NOTE — Progress Notes (Signed)
 PROGRESS NOTE        PATIENT DETAILS Name: Bridget Martinez Age: 73 y.o. Sex: female Date of Birth: [birth year] Admit Date: 08/02/2024 Admitting Physician Bradly MARLA Drones, MD ERE:[id number], [name], FNP  Brief Summary: Patient is a 73 y.o.  female with history of stage IV CKD, CAD s/p CABG, DM-2, hypothyroidism, chronic sacral decubitus ulcer-presented with hematuria in the setting of UTI.  Significant events: 11/25>> admit to [facility].  Significant studies: 11/24>> x-ray lumbar spine: Moderate compression deformity-L2. 11/24>> CT abdomen/pelvis: Bladder with dependent hyperdense material consistent with thrombus-no mass identified.  Significant microbiology data: 11/21>> urine culture: Proteus mirabilis 11/24>> urine culture: No growth  Procedures: None  Consults: Urology  Subjective: Lying comfortably in bed-denies any chest pain or shortness of breath.  Hematuria is resolved.  Objective: Vitals: Blood pressure (!) 101/44, pulse 70, temperature 97.9 F (36.6 C), temperature source Oral, resp. rate 15, SpO2 100%.   Exam: Gen Exam:Alert awake-not in any distress HEENT:atraumatic, normocephalic Chest: B/L clear to auscultation anteriorly CVS:S1S2 regular Abdomen:soft non tender, non distended Extremities:no edema Neurology: Non focal-generalized weakness. Skin: no rash  Pertinent Labs/Radiology:    Latest Ref Rng & Units 08/04/2024    2:19 AM 08/03/2024    2:19 AM 08/03/2024    1:12 AM  CBC  WBC 4.0 - 10.5 K/uL 9.5  9.7  NURSE WILL PUT ORDER BACK IN 08/03/2024 0158   Hemoglobin 12.0 - 15.0 g/dL 8.9  8.2  NURSE WILL PUT ORDER BACK IN 08/03/2024 0158   Hematocrit 36.0 - 46.0 % 27.5  25.2  NURSE WILL PUT ORDER BACK IN 08/03/2024 0158   Platelets 150 - 400 K/uL 361  356  NURSE WILL PUT ORDER BACK IN 08/03/2024 0158     Lab Results  Component Value Date   NA 135 08/04/2024   K 3.4 (L) 08/04/2024   CL 100 08/04/2024   CO2 24 08/04/2024       Assessment/Plan: Hematuria-likely secondary to complicated UTI (Proteus Mirabella's on culture on 11/21) Hematuria has resolved with CBI Foley catheter to be removed today-for voiding trial On IV Rocephin  Appreciate urology input.  AKI on CKD stage IIIb AKI hemodynamically mediated in the setting of hematuria/complicated UTI Has improved with supportive care-creatinine back to baseline.  Hypokalemia Replete/recheck.  Minimally elevated troponin Likely demand ischemia Per cardiology-not a candidate for invasive workup given ongoing hematuria  HLD Statin  DM-2 with uncontrolled hyperglycemia (A1c 8.0 on 8/23) Change Semglee  to 8 units twice daily (close to home regimen) SSI Follow CBGs  Recent Labs    08/03/24 0816 08/03/24 1153 08/03/24 1631  GLUCAP 247* 344* 222*    History of gastroparesis Likely related to DM Continue Reglan  Encourage small portion meals  Hypothyroidism  Synthroid   GERD PPI  Normocytic anemia Likely secondary to underlying CKD Continue oral iron supplementation-resume Epogen  injections postdischarge as previous.  Mood disorder Wellbutrin   Chronic debility/deconditioning Multiple falls-chronic L2 compression fracture As needed tramadol  TSLO brace Await PT/OT eval.  Pressure Ulcer: Agree with assessment and plan as outlined below-note-this is POA-per patient-ongoing at least 8 weeks prior to this hospitalization-and being followed by home health RN. Wound 08/03/24 1033 Pressure Injury Sacrum Medial Stage 3 -  Full thickness tissue loss. Subcutaneous fat may be visible but bone, tendon or muscle are NOT exposed. (Active)     Wound 08/03/24 1600 Pressure Injury  Thigh Left;Posterior;Proximal (Active)     Wound 08/03/24 1600 Pressure Injury Buttocks Right Stage 1 -  Intact skin with non-blanchable redness of a localized area usually over a bony prominence. (Active)    Code status:   Code Status: Full Code   DVT  Prophylaxis: Place and maintain sequential compression device Start: 08/03/24 0836   Family Communication: None at bedside   Disposition Plan: Status is: Inpatient Remains inpatient appropriate because: Severity of illness   Planned Discharge Destination:Home health vs SNF   Diet: Diet Order             Diet Carb Modified Fluid consistency: Thin; Room service appropriate? Yes  Diet effective now                     Antimicrobial agents: Anti-infectives (From admission, onward)    Start     Dose/Rate Route Frequency Ordered Stop   08/03/24 0200  cefTRIAXone  (ROCEPHIN ) 1 g in sodium chloride  0.9 % 100 mL IVPB        1 g 200 mL/hr over 30 Minutes Intravenous Every 24 hours 08/03/24 0139 08/08/24 0159   08/03/24 0145  cefTRIAXone  (ROCEPHIN ) 1 g in sodium chloride  0.9 % 100 mL IVPB  Status:  Discontinued        1 g 200 mL/hr over 30 Minutes Intravenous Every 24 hours 08/03/24 0139 08/03/24 0139   08/03/24 0130  meropenem (MERREM) 1 g in sodium chloride  0.9 % 100 mL IVPB  Status:  Discontinued        1 g 200 mL/hr over 30 Minutes Intravenous Every 8 hours 08/03/24 0126 08/03/24 0126   08/03/24 0130  aztreonam  (AZACTAM ) 1 g in sodium chloride  0.9 % 100 mL IVPB  Status:  Discontinued        1 g 200 mL/hr over 30 Minutes Intravenous Every 8 hours 08/03/24 0127 08/03/24 0127   08/03/24 0130  aztreonam  (AZACTAM ) 1 g in sodium chloride  0.9 % 100 mL IVPB  Status:  Discontinued        1 g 200 mL/hr over 30 Minutes Intravenous Every 8 hours 08/03/24 0127 08/03/24 0139        MEDICATIONS: Scheduled Meds:  buPROPion   150 mg Oral BID WC   ferrous sulfate   325 mg Oral QODAY   insulin  aspart  0-6 Units Subcutaneous TID WC   insulin  glargine-yfgn  6 Units Subcutaneous QHS   levothyroxine   75 mcg Oral Q0600   liver oil-zinc  oxide   Topical BID   metoCLOPramide   5 mg Oral BID AC   pantoprazole   40 mg Oral Daily   sodium bicarbonate   650 mg Oral TID   Continuous Infusions:   cefTRIAXone  (ROCEPHIN )  IV 1 g (08/04/24 0102)   sodium chloride  irrigation     PRN Meds:.prochlorperazine , traMADol    I have personally reviewed following labs and imaging studies  LABORATORY DATA: CBC: Recent Labs  Lab 07/29/24 1319 08/02/24 1711 08/02/24 1730 08/03/24 0112 08/03/24 0219 08/04/24 0219  WBC 14.3*  --  11.6* NURSE WILL PUT ORDER BACK IN 08/03/2024 0158 9.7 9.5  NEUTROABS 11.8*  --   --  PENDING 6.7 5.3  HGB 11.1* 10.9* 9.8* NURSE WILL PUT ORDER BACK IN 08/03/2024 0158 8.2* 8.9*  HCT 33.2* 32.0* 29.1* NURSE WILL PUT ORDER BACK IN 08/03/2024 0158 25.2* 27.5*  MCV 87.4  --  87.1 NURSE WILL PUT ORDER BACK IN 08/03/2024 0158 88.7 89.6  PLT 319  --  406* NURSE WILL PUT  ORDER BACK IN 08/03/2024 0158 356 361    Basic Metabolic Panel: Recent Labs  Lab 08/02/24 1711 08/02/24 1730 08/03/24 0112 08/04/24 0219  NA 133* 133* 134* 135  K 3.0* 2.9* 3.5 3.4*  CL 96* 92* 98 100  CO2  --  25 20* 24  GLUCOSE 280* 291* 300* 254*  BUN 80* 89* 75* 43*  CREATININE 2.00* 2.08* 1.82* 1.57*  CALCIUM   --  9.5 8.8* 8.5*  MG  --   --  1.7 2.4    GFR: CrCl cannot be calculated (Unknown ideal weight.).  Liver Function Tests: Recent Labs  Lab 08/02/24 1730  AST 20  ALT 23  ALKPHOS 160*  BILITOT 0.8  PROT 7.3  ALBUMIN  3.3*   No results for input(s): LIPASE, AMYLASE in the last 168 hours. No results for input(s): AMMONIA in the last 168 hours.  Coagulation Profile: No results for input(s): INR, PROTIME in the last 168 hours.  Cardiac Enzymes: No results for input(s): CKTOTAL, CKMB, CKMBINDEX, TROPONINI in the last 168 hours.  BNP (last 3 results) No results for input(s): PROBNP in the last 8760 hours.  Lipid Profile: No results for input(s): CHOL, HDL, LDLCALC, TRIG, CHOLHDL, LDLDIRECT in the last 72 hours.  Thyroid  Function Tests: No results for input(s): TSH, T4TOTAL, FREET4, T3FREE, THYROIDAB in the last 72  hours.  Anemia Panel: No results for input(s): VITAMINB12, FOLATE, FERRITIN, TIBC, IRON, RETICCTPCT in the last 72 hours.  Urine analysis:    Component Value Date/Time   COLORURINE RED (A) 08/02/2024 2102   APPEARANCEUR TURBID (A) 08/02/2024 2102   APPEARANCEUR Cloudy (A) 07/30/2024 1356   LABSPEC  08/02/2024 2102    TEST NOT REPORTED DUE TO COLOR INTERFERENCE OF URINE PIGMENT   PHURINE  08/02/2024 2102    TEST NOT REPORTED DUE TO COLOR INTERFERENCE OF URINE PIGMENT   GLUCOSEU (A) 08/02/2024 2102    TEST NOT REPORTED DUE TO COLOR INTERFERENCE OF URINE PIGMENT   HGBUR (A) 08/02/2024 2102    TEST NOT REPORTED DUE TO COLOR INTERFERENCE OF URINE PIGMENT   BILIRUBINUR (A) 08/02/2024 2102    TEST NOT REPORTED DUE TO COLOR INTERFERENCE OF URINE PIGMENT   BILIRUBINUR Negative 07/30/2024 1356   KETONESUR (A) 08/02/2024 2102    TEST NOT REPORTED DUE TO COLOR INTERFERENCE OF URINE PIGMENT   PROTEINUR (A) 08/02/2024 2102    TEST NOT REPORTED DUE TO COLOR INTERFERENCE OF URINE PIGMENT   UROBILINOGEN negative 10/03/2015 1154   UROBILINOGEN 0.2 03/06/2015 2158   NITRITE (A) 08/02/2024 2102    TEST NOT REPORTED DUE TO COLOR INTERFERENCE OF URINE PIGMENT   LEUKOCYTESUR (A) 08/02/2024 2102    TEST NOT REPORTED DUE TO COLOR INTERFERENCE OF URINE PIGMENT    Sepsis Labs: Lactic Acid, Venous    Component Value Date/Time   LATICACIDVEN 1.4 03/06/2015 2200    MICROBIOLOGY: Recent Results (from the past 240 hours)  Urine Culture     Status: Abnormal   Collection Time: 07/30/24  1:56 PM   Specimen: Urine   UR  Result Value Ref Range Status   Urine Culture, Routine Final report (A)  Final   Organism ID, Bacteria Proteus mirabilis (A)  Final    Comment: Cefazolin  with an MIC <=16 predicts susceptibility to the oral agents cefaclor, cefdinir, cefpodoxime, cefprozil, cefuroxime , cephalexin , and loracarbef when used for therapy of uncomplicated urinary tract infections due to E.  coli, Klebsiella pneumoniae, and Proteus mirabilis. Greater than 100,000 colony forming units per mL  Antimicrobial Susceptibility Comment  Final    Comment:       ** S = Susceptible; I = Intermediate; R = Resistant **                    P = Positive; N = Negative             MICS are expressed in micrograms per mL    Antibiotic                 RSLT#1    RSLT#2    RSLT#3    RSLT#4 Amoxicillin/Clavulanic Acid    S Ampicillin                     S Cefazolin                       S Cefepime                       S Cefoxitin                      S Cefpodoxime                    S Ceftriaxone                     S Ciprofloxacin                   S Ertapenem                      S Gentamicin                     S Levofloxacin                   S Meropenem                      S Nitrofurantoin                  R Piperacillin /Tazobactam        S Tetracycline                   R Tobramycin                     S Trimethoprim/Sulfa             S   Microscopic Examination     Status: Abnormal   Collection Time: 07/30/24  1:56 PM   Urine  Result Value Ref Range Status   WBC, UA 6-10 (A) 0 - 5 /hpf Final   RBC, Urine 11-30 (A) 0 - 2 /hpf Final   Epithelial Cells (non renal) None seen 0 - 10 /hpf Final   Renal Epithel, UA None seen None seen /hpf Final   Bacteria, UA Many (A) None seen/Few Final   Yeast, UA None seen None seen Final  Urine Culture     Status: None   Collection Time: 08/02/24 11:18 PM   Specimen: Urine, Clean Catch  Result Value Ref Range Status   Specimen Description URINE, CLEAN CATCH  Final   Special Requests NONE  Final   Culture   Final    NO GROWTH Performed at [facility], [address] [place], [place] [address]    Report Status 08/04/2024 FINAL  Final    RADIOLOGY STUDIES/RESULTS: CT Renal Stone Study Result Date: 08/02/2024 EXAM: CT ABDOMEN AND PELVIS WITHOUT CONTRAST 08/02/2024 09:45:34 PM TECHNIQUE: CT of the abdomen and pelvis was performed  without the administration of intravenous contrast. Multiplanar reformatted images are provided for review. Automated exposure control, iterative reconstruction, and/or weight-based adjustment of the mA/kV was utilized to reduce the radiation dose to as low as reasonably achievable. COMPARISON: 07/30/2014 CLINICAL HISTORY: Back pain and weakness, hematuria. FINDINGS: LOWER CHEST: Lung bases are clear. LIVER: The liver is within normal limits. GALLBLADDER AND BILE DUCTS: The gallbladder is within normal limits. No biliary ductal dilatation. SPLEEN: The spleen is unremarkable. PANCREAS: The pancreas is unremarkable. ADRENAL GLANDS: The adrenal glands are unremarkable. KIDNEYS, URETERS AND BLADDER: Kidneys are well visualized bilaterally. No renal calculi are seen. Fullness of the right renal pelvis is noted although no ureteral dilatation is seen. Obstructive changes are noted on the left. The bladder is well distended with diffuse hyperdense material in the dependent portion. This likely represents thrombus consistent with the given clinical history. No definitive mass is seen. GI AND BOWEL: Stomach demonstrates no acute abnormality. There is no bowel obstruction. PERITONEUM AND RETROPERITONEUM: No ascites. No free air. VASCULATURE: Aorta is normal in caliber. LYMPH NODES: No lymphadenopathy. REPRODUCTIVE ORGANS: The uterus is within normal limits. BONES AND SOFT TISSUES: No acute osseous abnormality. No focal soft tissue abnormality. IMPRESSION: 1. Bladder with dependent hyperdense material consistent with thrombus; no definite mass identified. Electronically signed by: Oneil Devonshire MD 08/02/2024 09:52 PM EST RP Workstation: [id number]   DG Lumbar Spine Complete Result Date: 08/02/2024 CLINICAL DATA:  Back pain after fall EXAM: DG LUMBAR SPINE COMPLETE 4+V COMPARISON:  Lumbar spine x-ray 05/01/2024 FINDINGS: The bones are diffusely osteopenic. There is moderate compression deformity of L2 which has progressed  compared to the prior study. No additional fractures are seen. Alignment is anatomic. There is mild disc space narrowing and endplate osteophyte formation throughout the lumbar spine compatible with degenerative change. Left hip arthroplasty is present. IMPRESSION: 1. Moderate compression deformity of L2 which has progressed compared to the prior study. Correlate clinically for acute on chronic fracture. 2. Diffuse osteopenia. 3. Mild degenerative changes throughout the lumbar spine. Electronically Signed   By: Greig Pique M.D.   On: 08/02/2024 18:28     LOS: 1 day   Donalda Applebaum, MD  [facility]    To contact the attending provider between 7A-7P or the covering provider during after hours 7P-7A, please log into the web site [email or web address] and access using universal [place] password for that web site. If you do not have the password, please call the hospital operator.  08/04/2024, 10:04 AM

## 2024-08-04 NOTE — Consult Note (Signed)
 WOC Nurse Consult Note: reconsult for buttock wounds Large amount of macerated tissue, no allergies to iodine or shellfish per patent, will switch out wound care orders, buffer periwound skin with zinc  oxide barrier, patient stated the wound had increased in discomfort. Provider was at bedside and discussed PI. Reason for Consult: sacrum wound Wound type:PI stage 4 bone was palpated with q-tipped applicator Pressure Injury POA: Yes Measurement:  2 x 2 x 1 cm depth Wound bed: pink, high moisture Drainage  no malodor, serous fluid Periwound: macerated tissue Patient has scaring to various sites over soft buttock tissue and along ischial, will require padding with foam dressings and repositioning. Dressing procedure/placement/frequency:  Cleanse wound with Vashe solution, pat dry but do not rinse, pack wound to buttock with Iodoform packing material [phone], leave long tail out, apply Desitin around periwound skin, cover with ABD pads, wide tape to secure in place.  Nursing staff to off-load heels onto pillows at all times.  08/03/24 buttock, upper coccyx    WOC will not follow and will remove patient from census task list. Please reconsult if wound worsens in condition and notify provider.   Sherrilyn Hals MSN RN [facility]OCN [facility]  [phone] (Available from 7-3 pm Mon-Friday)

## 2024-08-05 DIAGNOSIS — E876 Hypokalemia: Secondary | ICD-10-CM | POA: Diagnosis not present

## 2024-08-05 DIAGNOSIS — N3001 Acute cystitis with hematuria: Secondary | ICD-10-CM | POA: Diagnosis not present

## 2024-08-05 DIAGNOSIS — N189 Chronic kidney disease, unspecified: Secondary | ICD-10-CM | POA: Diagnosis not present

## 2024-08-05 LAB — GLUCOSE, CAPILLARY
Glucose-Capillary: 219 mg/dL — ABNORMAL HIGH (ref 70–99)
Glucose-Capillary: 211 mg/dL — ABNORMAL HIGH (ref 70–99)
Glucose-Capillary: 243 mg/dL — ABNORMAL HIGH (ref 70–99)

## 2024-08-05 LAB — BASIC METABOLIC PANEL WITH GFR
Anion gap: 11 (ref 5–15)
BUN: 33 mg/dL — ABNORMAL HIGH (ref 8–23)
CO2: 25 mmol/L (ref 22–32)
Calcium: 8.8 mg/dL — ABNORMAL LOW (ref 8.9–10.3)
Chloride: 99 mmol/L (ref 98–111)
Creatinine, Ser: 1.5 mg/dL — ABNORMAL HIGH (ref 0.44–1.00)
GFR, Estimated: 36 mL/min — ABNORMAL LOW (ref >60)
Glucose, Bld: 259 mg/dL — ABNORMAL HIGH (ref 70–99)
Potassium: 4 mmol/L (ref 3.5–5.1)
Sodium: 135 mmol/L (ref 135–145)

## 2024-08-05 MED ORDER — LOPERAMIDE HCL 2 MG PO CAPS
2.0000 mg | ORAL_CAPSULE | ORAL | Status: DC | PRN
  Administered 2024-08-05: 2 mg via ORAL
  Filled 2024-08-05: qty 1

## 2024-08-05 MED ORDER — INSULIN GLARGINE-YFGN 100 UNIT/ML ~~LOC~~ SOLN
12.0000 [IU] | Freq: Two times a day (BID) | SUBCUTANEOUS | Status: DC
  Administered 2024-08-05 (×2): 12 [IU] via SUBCUTANEOUS
  Filled 2024-08-05 (×4): qty 0.12

## 2024-08-05 NOTE — Plan of Care (Signed)

## 2024-08-05 NOTE — Progress Notes (Signed)
 PROGRESS NOTE        PATIENT DETAILS Name: Bridget Martinez Age: 73 y.o. Sex: female Date of Birth: 1951/02/05 Admit Date: 08/02/2024 Admitting Physician Bradly MARLA Drones, MD ERE:[id number], [name], FNP  Brief Summary: Patient is a 73 y.o.  female with history of stage IV CKD, CAD s/p CABG, DM-2, hypothyroidism, chronic sacral decubitus ulcer-presented with hematuria in the setting of UTI.  Significant events: 11/25>> admit to [facility].  Significant studies: 11/24>> x-ray lumbar spine: Moderate compression deformity-L2. 11/24>> CT abdomen/pelvis: Bladder with dependent hyperdense material consistent with thrombus-no mass identified.  Significant microbiology data: 11/21>> urine culture: Proteus mirabilis 11/24>> urine culture: No growth  Procedures: None  Consults: Urology  Subjective: No major issues overnight-no hematuria-amber-colored urine seen in canister.  Objective: Vitals: Blood pressure 116/75, pulse 77, temperature 98.9 F (37.2 C), temperature source Oral, resp. rate 16, SpO2 100%.   Exam: Gen Exam:Alert awake-not in any distress HEENT:atraumatic, normocephalic Chest: B/L clear to auscultation anteriorly CVS:S1S2 regular Abdomen:soft non tender, non distended Extremities:no edema Neurology: Non focal-but has generalized weakness. Skin: no rash  Pertinent Labs/Radiology:    Latest Ref Rng & Units 08/04/2024    2:19 AM 08/03/2024    2:19 AM 08/03/2024    1:12 AM  CBC  WBC 4.0 - 10.5 K/uL 9.5  9.7  NURSE WILL PUT ORDER BACK IN 08/03/2024 0158   Hemoglobin 12.0 - 15.0 g/dL 8.9  8.2  NURSE WILL PUT ORDER BACK IN 08/03/2024 0158   Hematocrit 36.0 - 46.0 % 27.5  25.2  NURSE WILL PUT ORDER BACK IN 08/03/2024 0158   Platelets 150 - 400 K/uL 361  356  NURSE WILL PUT ORDER BACK IN 08/03/2024 0158     Lab Results  Component Value Date   NA 135 08/05/2024   K 4.0 08/05/2024   CL 99 08/05/2024   CO2 25 08/05/2024       Assessment/Plan: Hematuria-likely secondary to complicated UTI (Proteus Mirabella's on culture on 11/21) Hematuria has resolved with CBI Foley catheter removed on 11/26-voiding well. Has been switched from IV Rocephin  to Ceftin  Appreciate urology input.    AKI on CKD stage IIIb AKI hemodynamically mediated in the setting of hematuria/complicated UTI Has improved with supportive care-creatinine back to baseline.  Hypokalemia Repleted.  Minimally elevated troponin Likely demand ischemia Per cardiology-not a candidate for invasive workup given ongoing hematuria  HLD Statin  DM-2 with uncontrolled hyperglycemia (A1c 8.0 on 8/23) Increase Semglee  to 12 units twice daily Continue SSI Follow CBGs.   Recent Labs    08/04/24 1650 08/04/24 2122 08/05/24 0729  GLUCAP 366* 337* 219*    History of gastroparesis Likely related to DM Continue Reglan  Encourage small portion meals  Hypothyroidism  Synthroid   GERD PPI  Normocytic anemia Likely secondary to underlying CKD Continue oral iron supplementation-resume Epogen  injections postdischarge as previous.  Mood disorder Wellbutrin   Chronic debility/deconditioning Multiple falls-chronic L2 compression fracture As needed tramadol  TSLO brace PT/OT eval-SNF recommended.  Pressure Ulcer: Agree with assessment and plan as outlined below-note-this is POA-per patient-ongoing at least 8 weeks prior to this hospitalization-and being followed by home health RN. Wound 08/03/24 1033 Pressure Injury Sacrum Medial Stage 3 -  Full thickness tissue loss. Subcutaneous fat may be visible but bone, tendon or muscle are NOT exposed. (Active)     Wound 08/03/24 1600 Pressure Injury Thigh Left;Posterior;Proximal (Active)  Code status:   Code Status: Full Code   DVT Prophylaxis: Place and maintain sequential compression device Start: 08/03/24 0836   Family Communication: None at bedside   Disposition Plan: Status is:  Inpatient Remains inpatient appropriate because: Severity of illness   Planned Discharge Destination:Home health vs SNF   Diet: Diet Order             Diet Carb Modified Fluid consistency: Thin; Room service appropriate? Yes  Diet effective now                     Antimicrobial agents: Anti-infectives (From admission, onward)    Start     Dose/Rate Route Frequency Ordered Stop   08/05/24 0800  cefUROXime  (CEFTIN ) tablet 250 mg        250 mg Oral 2 times daily with meals 08/04/24 1103 08/10/24 0759   08/03/24 0200  cefTRIAXone  (ROCEPHIN ) 1 g in sodium chloride  0.9 % 100 mL IVPB  Status:  Discontinued        1 g 200 mL/hr over 30 Minutes Intravenous Every 24 hours 08/03/24 0139 08/04/24 1103   08/03/24 0145  cefTRIAXone  (ROCEPHIN ) 1 g in sodium chloride  0.9 % 100 mL IVPB  Status:  Discontinued        1 g 200 mL/hr over 30 Minutes Intravenous Every 24 hours 08/03/24 0139 08/03/24 0139   08/03/24 0130  meropenem (MERREM) 1 g in sodium chloride  0.9 % 100 mL IVPB  Status:  Discontinued        1 g 200 mL/hr over 30 Minutes Intravenous Every 8 hours 08/03/24 0126 08/03/24 0126   08/03/24 0130  aztreonam  (AZACTAM ) 1 g in sodium chloride  0.9 % 100 mL IVPB  Status:  Discontinued        1 g 200 mL/hr over 30 Minutes Intravenous Every 8 hours 08/03/24 0127 08/03/24 0127   08/03/24 0130  aztreonam  (AZACTAM ) 1 g in sodium chloride  0.9 % 100 mL IVPB  Status:  Discontinued        1 g 200 mL/hr over 30 Minutes Intravenous Every 8 hours 08/03/24 0127 08/03/24 0139        MEDICATIONS: Scheduled Meds:  buPROPion   150 mg Oral BID WC   cefUROXime   250 mg Oral BID WC   ferrous sulfate   325 mg Oral QODAY   insulin  aspart  0-6 Units Subcutaneous TID WC   insulin  glargine-yfgn  12 Units Subcutaneous BID   levothyroxine   75 mcg Oral Q0600   liver oil-zinc  oxide   Topical BID   metoCLOPramide   5 mg Oral BID AC   pantoprazole   40 mg Oral Daily   rosuvastatin   40 mg Oral Daily   sodium  bicarbonate  650 mg Oral TID   Continuous Infusions:  sodium chloride  irrigation     PRN Meds:.loperamide , prochlorperazine , traMADol    I have personally reviewed following labs and imaging studies  LABORATORY DATA: CBC: Recent Labs  Lab 07/29/24 1319 08/02/24 1711 08/02/24 1730 08/03/24 0112 08/03/24 0219 08/04/24 0219  WBC 14.3*  --  11.6* NURSE WILL PUT ORDER BACK IN 08/03/2024 0158 9.7 9.5  NEUTROABS 11.8*  --   --  PENDING 6.7 5.3  HGB 11.1* 10.9* 9.8* NURSE WILL PUT ORDER BACK IN 08/03/2024 0158 8.2* 8.9*  HCT 33.2* 32.0* 29.1* NURSE WILL PUT ORDER BACK IN 08/03/2024 0158 25.2* 27.5*  MCV 87.4  --  87.1 NURSE WILL PUT ORDER BACK IN 08/03/2024 0158 88.7 89.6  PLT 319  --  406* NURSE WILL PUT ORDER BACK IN 08/03/2024 0158 356 361    Basic Metabolic Panel: Recent Labs  Lab 08/02/24 1711 08/02/24 1730 08/03/24 0112 08/04/24 0219 08/05/24 0322  NA 133* 133* 134* 135 135  K 3.0* 2.9* 3.5 3.4* 4.0  CL 96* 92* 98 100 99  CO2  --  25 20* 24 25  GLUCOSE 280* 291* 300* 254* 259*  BUN 80* 89* 75* 43* 33*  CREATININE 2.00* 2.08* 1.82* 1.57* 1.50*  CALCIUM   --  9.5 8.8* 8.5* 8.8*  MG  --   --  1.7 2.4  --     GFR: CrCl cannot be calculated (Unknown ideal weight.).  Liver Function Tests: Recent Labs  Lab 08/02/24 1730  AST 20  ALT 23  ALKPHOS 160*  BILITOT 0.8  PROT 7.3  ALBUMIN  3.3*   No results for input(s): LIPASE, AMYLASE in the last 168 hours. No results for input(s): AMMONIA in the last 168 hours.  Coagulation Profile: No results for input(s): INR, PROTIME in the last 168 hours.  Cardiac Enzymes: No results for input(s): CKTOTAL, CKMB, CKMBINDEX, TROPONINI in the last 168 hours.  BNP (last 3 results) No results for input(s): PROBNP in the last 8760 hours.  Lipid Profile: No results for input(s): CHOL, HDL, LDLCALC, TRIG, CHOLHDL, LDLDIRECT in the last 72 hours.  Thyroid  Function Tests: No results for input(s):  TSH, T4TOTAL, FREET4, T3FREE, THYROIDAB in the last 72 hours.  Anemia Panel: No results for input(s): VITAMINB12, FOLATE, FERRITIN, TIBC, IRON, RETICCTPCT in the last 72 hours.  Urine analysis:    Component Value Date/Time   COLORURINE RED (A) 08/02/2024 2102   APPEARANCEUR TURBID (A) 08/02/2024 2102   APPEARANCEUR Cloudy (A) 07/30/2024 1356   LABSPEC  08/02/2024 2102    TEST NOT REPORTED DUE TO COLOR INTERFERENCE OF URINE PIGMENT   PHURINE  08/02/2024 2102    TEST NOT REPORTED DUE TO COLOR INTERFERENCE OF URINE PIGMENT   GLUCOSEU (A) 08/02/2024 2102    TEST NOT REPORTED DUE TO COLOR INTERFERENCE OF URINE PIGMENT   HGBUR (A) 08/02/2024 2102    TEST NOT REPORTED DUE TO COLOR INTERFERENCE OF URINE PIGMENT   BILIRUBINUR (A) 08/02/2024 2102    TEST NOT REPORTED DUE TO COLOR INTERFERENCE OF URINE PIGMENT   BILIRUBINUR Negative 07/30/2024 1356   KETONESUR (A) 08/02/2024 2102    TEST NOT REPORTED DUE TO COLOR INTERFERENCE OF URINE PIGMENT   PROTEINUR (A) 08/02/2024 2102    TEST NOT REPORTED DUE TO COLOR INTERFERENCE OF URINE PIGMENT   UROBILINOGEN negative 10/03/2015 1154   UROBILINOGEN 0.2 03/06/2015 2158   NITRITE (A) 08/02/2024 2102    TEST NOT REPORTED DUE TO COLOR INTERFERENCE OF URINE PIGMENT   LEUKOCYTESUR (A) 08/02/2024 2102    TEST NOT REPORTED DUE TO COLOR INTERFERENCE OF URINE PIGMENT    Sepsis Labs: Lactic Acid, Venous    Component Value Date/Time   LATICACIDVEN 1.4 03/06/2015 2200    MICROBIOLOGY: Recent Results (from the past 240 hours)  Urine Culture     Status: Abnormal   Collection Time: 07/30/24  1:56 PM   Specimen: Urine   UR  Result Value Ref Range Status   Urine Culture, Routine Final report (A)  Final   Organism ID, Bacteria Proteus mirabilis (A)  Final    Comment: Cefazolin  with an MIC <=16 predicts susceptibility to the oral agents cefaclor, cefdinir, cefpodoxime, cefprozil, cefuroxime , cephalexin , and loracarbef when used for  therapy of uncomplicated urinary tract infections due  to E. coli, Klebsiella pneumoniae, and Proteus mirabilis. Greater than 100,000 colony forming units per mL    Antimicrobial Susceptibility Comment  Final    Comment:       ** S = Susceptible; I = Intermediate; R = Resistant **                    P = Positive; N = Negative             MICS are expressed in micrograms per mL    Antibiotic                 RSLT#1    RSLT#2    RSLT#3    RSLT#4 Amoxicillin/Clavulanic Acid    S Ampicillin                     S Cefazolin                       S Cefepime                       S Cefoxitin                      S Cefpodoxime                    S Ceftriaxone                     S Ciprofloxacin                   S Ertapenem                      S Gentamicin                     S Levofloxacin                   S Meropenem                      S Nitrofurantoin                  R Piperacillin /Tazobactam        S Tetracycline                   R Tobramycin                     S Trimethoprim/Sulfa             S   Microscopic Examination     Status: Abnormal   Collection Time: 07/30/24  1:56 PM   Urine  Result Value Ref Range Status   WBC, UA 6-10 (A) 0 - 5 /hpf Final   RBC, Urine 11-30 (A) 0 - 2 /hpf Final   Epithelial Cells (non renal) None seen 0 - 10 /hpf Final   Renal Epithel, UA None seen None seen /hpf Final   Bacteria, UA Many (A) None seen/Few Final   Yeast, UA None seen None seen Final  Urine Culture     Status: None   Collection Time: 08/02/24 11:18 PM   Specimen: Urine, Clean Catch  Result Value Ref Range Status   Specimen Description URINE, CLEAN CATCH  Final   Special Requests NONE  Final   Culture   Final    NO GROWTH Performed at  [facility], [address] [place], [place] [address]    Report Status 08/04/2024 FINAL  Final    RADIOLOGY STUDIES/RESULTS: No results found.    LOS: 2 days   Donalda Applebaum, MD  [facility]    To contact the  attending provider between 7A-7P or the covering provider during after hours 7P-7A, please log into the web site [email or web address] and access using universal [place] password for that web site. If you do not have the password, please call the hospital operator.  08/05/2024, 10:41 AM

## 2024-08-06 DIAGNOSIS — I5042 Chronic combined systolic (congestive) and diastolic (congestive) heart failure: Secondary | ICD-10-CM | POA: Diagnosis not present

## 2024-08-06 DIAGNOSIS — E039 Hypothyroidism, unspecified: Secondary | ICD-10-CM | POA: Diagnosis not present

## 2024-08-06 DIAGNOSIS — N3001 Acute cystitis with hematuria: Secondary | ICD-10-CM | POA: Diagnosis not present

## 2024-08-06 LAB — GLUCOSE, CAPILLARY
Glucose-Capillary: 202 mg/dL — ABNORMAL HIGH (ref 70–99)
Glucose-Capillary: 173 mg/dL — ABNORMAL HIGH (ref 70–99)
Glucose-Capillary: 223 mg/dL — ABNORMAL HIGH (ref 70–99)
Glucose-Capillary: 156 mg/dL — ABNORMAL HIGH (ref 70–99)

## 2024-08-06 MED ORDER — INSULIN GLARGINE-YFGN 100 UNIT/ML ~~LOC~~ SOLN
14.0000 [IU] | Freq: Two times a day (BID) | SUBCUTANEOUS | Status: DC
  Administered 2024-08-06 – 2024-08-07 (×3): 14 [IU] via SUBCUTANEOUS
  Filled 2024-08-06 (×4): qty 0.14

## 2024-08-06 NOTE — Plan of Care (Signed)

## 2024-08-06 NOTE — Progress Notes (Signed)
 Occupational Therapy Treatment Patient Details Name: Bridget Martinez MRN: [id number] DOB: [birth year] Today's Date: 08/06/2024   History of present illness Patient is a 73 y.o.  female presented with hematuria in the setting of UTI.  x-ray lumbar spine: Moderate compression deformity-L2. Past medical history of stage IV CKD, CAD s/p CABG, DM-2, hypothyroidism, chronic sacral decubitus ulcer.   OT comments  Pt greeted curled up in recliner chair, agreeable for OT session. Pt eager to get back to bed. More vocal about fear of falling this session, provided therapeutic reassurance. She was generally min A for transfers and stepping chair>bed. Transiently dizzy with positional changes - see BP measurements below. Min A for UB dressing and simple exercises seated in the chair prior to returning to bed. Continues to be limited by decr activity tolerance and fear of falling. OT to continue to follow and progress as able.  BP on arrival: 87/40 (56) BP sitting upright in chair after LE exercises: 107/47 (63) BP in supine post-mobility: 121/49 (72)      If plan is discharge home, recommend the following:  A lot of help with walking and/or transfers;A lot of help with bathing/dressing/bathroom;Assistance with cooking/housework;Direct supervision/assist for medications management;Direct supervision/assist for financial management;Assist for transportation;Help with stairs or ramp for entrance   Equipment Recommendations  Other (comment) (defer to next level of care)    Recommendations for Other Services      Precautions / Restrictions Precautions Precautions: Fall;Back Precaution Booklet Issued: No Recall of Precautions/Restrictions: Intact Precaution/Restrictions Comments: great fear of falling Required Braces or Orthoses: Spinal Brace Spinal Brace: Lumbar corset;Applied in sitting position;Other (comment) (can don EOB) Spinal Brace Comments: for ambulation per active orders Restrictions Weight  Bearing Restrictions Per Provider Order: No       Mobility Bed Mobility Overal bed mobility: Needs Assistance Bed Mobility: Sit to Supine       Sit to supine: Mod assist, Used rails   General bed mobility comments: cued for reverse log roll technique, assisted with LE's    Transfers Overall transfer level: Needs assistance Equipment used: Rolling walker (2 wheels) Transfers: Sit to/from Stand, Bed to chair/wheelchair/BSC Sit to Stand: Min assist     Step pivot transfers: Min assist     General transfer comment: VC to lean forward, hand placement. Assist for powering up to RW.     Balance Overall balance assessment: Needs assistance Sitting-balance support: Single extremity supported, Feet supported Sitting balance-Leahy Scale: Fair Sitting balance - Comments: seated unsupported from backrest in chair   Standing balance support: Bilateral upper extremity supported, During functional activity, Reliant on assistive device for balance Standing balance-Leahy Scale: Poor Standing balance comment: reliant on RW, cues for therapeutic breathing to decr anxiety around fear of falling                           ADL either performed or assessed with clinical judgement   ADL Overall ADL's : Needs assistance/impaired Eating/Feeding: Set up;Sitting               Upper Body Dressing : Minimal assistance;Sitting Upper Body Dressing Details (indicate cue type and reason): gown exchange         Toileting- Clothing Manipulation and Hygiene: Total assistance Toileting - Clothing Manipulation Details (indicate cue type and reason): purewick intact     Functional mobility during ADLs: Minimal assistance;Rolling walker (2 wheels)      Extremity/Trunk Assessment  Vision       Perception     Praxis     Communication Communication Communication: No apparent difficulties   Cognition Arousal: Alert Behavior During Therapy: Anxious Cognition:  Cognition impaired     Awareness: Intellectual awareness impaired Memory impairment (select all impairments): Short-term memory, Working memory Attention impairment (select first level of impairment): Sustained attention Executive functioning impairment (select all impairments): Initiation, Organization, Sequencing, Problem solving OT - Cognition Comments: fearful of falling                 Following commands: Impaired Following commands impaired: Follows one step commands with increased time, Follows multi-step commands inconsistently      Cueing   Cueing Techniques: Verbal cues, Tactile cues, Visual cues, Gestural cues  Exercises General Exercises - Lower Extremity Long Arc Quad: AROM, Both, 10 reps, Seated (in order to improve LE circulation)    Shoulder Instructions       General Comments BP on arrival: 87/40 (56), BP sitting upright in chair after LE there-ex: 107/47 (63), BP in supine post-mobility: 121/49 (72); pt endorsing positional dizziness but improves and subsides with time    Pertinent Vitals/ Pain       Pain Assessment Pain Assessment: Faces Faces Pain Scale: Hurts a little bit Pain Location: back Pain Descriptors / Indicators: Discomfort, Grimacing Pain Intervention(s): Limited activity within patient's tolerance, Monitored during session  Home Living                                          Prior Functioning/Environment              Frequency  Min 2X/week        Progress Toward Goals  OT Goals(current goals can now be found in the care plan section)  Progress towards OT goals: Progressing toward goals     Plan      Co-evaluation                 AM-PAC OT 6 Clicks Daily Activity     Outcome Measure   Help from another person eating meals?: A Little Help from another person taking care of personal grooming?: A Little Help from another person toileting, which includes using toliet, bedpan, or urinal?:  Total Help from another person bathing (including washing, rinsing, drying)?: A Lot Help from another person to put on and taking off regular upper body clothing?: A Lot Help from another person to put on and taking off regular lower body clothing?: Total 6 Click Score: 12    End of Session Equipment Utilized During Treatment: Rolling walker (2 wheels)  OT Visit Diagnosis: Unsteadiness on feet (R26.81);History of falling (Z91.81);Other symptoms and signs involving cognitive function;Muscle weakness (generalized) (M62.81)   Activity Tolerance Patient tolerated treatment well   Patient Left in bed;with call bell/phone within reach;with bed alarm set   Nurse Communication          Time: 8649-8586 OT Time Calculation (min): 23 min  Charges: OT General Charges $OT Visit: 1 Visit OT Treatments $Self Care/Home Management : 8-22 mins $Therapeutic Activity: 8-22 mins  Bridget Martinez, OTR/L [facility] [phone] Secure Chat Preferred  Bridget Martinez 08/06/2024, 4:20 PM

## 2024-08-06 NOTE — Progress Notes (Signed)
 PROGRESS NOTE        PATIENT DETAILS Name: Bridget Martinez Age: 73 y.o. Sex: female Date of Birth: 30-Jun-1951 Admit Date: 08/02/2024 Admitting Physician Bradly MARLA Drones, MD ERE:[id number], [name], FNP  Brief Summary: Patient is a 73 y.o.  female with history of stage IV CKD, CAD s/p CABG, DM-2, hypothyroidism, chronic sacral decubitus ulcer-presented with hematuria in the setting of UTI.  Significant events: 11/25>> admit to [facility].  Significant studies: 11/24>> x-ray lumbar spine: Moderate compression deformity-L2. 11/24>> CT abdomen/pelvis: Bladder with dependent hyperdense material consistent with thrombus-no mass identified.  Significant microbiology data: 11/21>> urine culture: Proteus mirabilis 11/24>> urine culture: No growth  Procedures: None  Consults: Urology  Subjective: No major issues overnight-lying comfortably in bed-amber-colored urine in external given collecting canister.  No complaints.  Objective: Vitals: Blood pressure 106/69, pulse 81, temperature 98.6 F (37 C), temperature source Oral, resp. rate 19, SpO2 100%.   Exam: Gen Exam:Alert awake-not in any distress HEENT:atraumatic, normocephalic Chest: B/L clear to auscultation anteriorly CVS:S1S2 regular Abdomen:soft non tender, non distended Extremities:no edema Neurology: Non focal-but has generalized weakness Skin: no rash  Pertinent Labs/Radiology:    Latest Ref Rng & Units 08/04/2024    2:19 AM 08/03/2024    2:19 AM 08/03/2024    1:12 AM  CBC  WBC 4.0 - 10.5 K/uL 9.5  9.7  NURSE WILL PUT ORDER BACK IN 08/03/2024 0158   Hemoglobin 12.0 - 15.0 g/dL 8.9  8.2  NURSE WILL PUT ORDER BACK IN 08/03/2024 0158   Hematocrit 36.0 - 46.0 % 27.5  25.2  NURSE WILL PUT ORDER BACK IN 08/03/2024 0158   Platelets 150 - 400 K/uL 361  356  NURSE WILL PUT ORDER BACK IN 08/03/2024 0158     Lab Results  Component Value Date   NA 135 08/05/2024   K 4.0 08/05/2024   CL 99 08/05/2024    CO2 25 08/05/2024      Assessment/Plan: Hematuria-likely secondary to complicated UTI (Proteus Mirabella's on culture on 11/21) Hematuria has resolved with CBI Foley catheter removed on 11/26-voiding well. Has been switched from IV Rocephin  to Ceftin  Appreciate urology input.    AKI on CKD stage IIIb AKI hemodynamically mediated in the setting of hematuria/complicated UTI Has improved with supportive care-creatinine back to baseline.  Hypokalemia Repleted.  Minimally elevated troponin Likely demand ischemia Per cardiology-not a candidate for invasive workup given ongoing hematuria  HLD Statin  DM-2 with uncontrolled hyperglycemia (A1c 8.0 on 8/23) CBGs still on the higher side. Increase Semglee  to 14 units twice daily i Continue SSI Follow CBGs.   Recent Labs    08/05/24 1115 08/05/24 1652 08/06/24 0837  GLUCAP 211* 243* 202*    History of gastroparesis Likely related to DM Continue Reglan  Encourage small portion meals  Hypothyroidism  Synthroid   GERD PPI  Normocytic anemia Likely secondary to underlying CKD Continue oral iron supplementation-resume Epogen  injections postdischarge as previous.  Mood disorder Wellbutrin   Chronic debility/deconditioning Multiple falls-chronic L2 compression fracture As needed tramadol  TSLO brace PT/OT eval-SNF recommended.  Pressure Ulcer: Agree with assessment and plan as outlined below-note-this is POA-per patient-ongoing at least 8 weeks prior to this hospitalization-and being followed by home health RN. Wound 08/03/24 1033 Pressure Injury Sacrum Medial Stage 3 -  Full thickness tissue loss. Subcutaneous fat may be visible but bone, tendon or muscle are NOT  exposed. (Active)     Wound 08/03/24 1600 Pressure Injury Thigh Left;Posterior;Proximal (Active)    Code status:   Code Status: Full Code   DVT Prophylaxis: Place and maintain sequential compression device Start: 08/03/24 0836   Family Communication:  None at bedside   Disposition Plan: Status is: Inpatient Remains inpatient appropriate because: Severity of illness   Planned Discharge Destination: SNF-awaiting insurance authorization/SNF availability   Diet: Diet Order             Diet Carb Modified Fluid consistency: Thin; Room service appropriate? Yes  Diet effective now                     Antimicrobial agents: Anti-infectives (From admission, onward)    Start     Dose/Rate Route Frequency Ordered Stop   08/05/24 0800  cefUROXime  (CEFTIN ) tablet 250 mg        250 mg Oral 2 times daily with meals 08/04/24 1103 08/10/24 0759   08/03/24 0200  cefTRIAXone  (ROCEPHIN ) 1 g in sodium chloride  0.9 % 100 mL IVPB  Status:  Discontinued        1 g 200 mL/hr over 30 Minutes Intravenous Every 24 hours 08/03/24 0139 08/04/24 1103   08/03/24 0145  cefTRIAXone  (ROCEPHIN ) 1 g in sodium chloride  0.9 % 100 mL IVPB  Status:  Discontinued        1 g 200 mL/hr over 30 Minutes Intravenous Every 24 hours 08/03/24 0139 08/03/24 0139   08/03/24 0130  meropenem (MERREM) 1 g in sodium chloride  0.9 % 100 mL IVPB  Status:  Discontinued        1 g 200 mL/hr over 30 Minutes Intravenous Every 8 hours 08/03/24 0126 08/03/24 0126   08/03/24 0130  aztreonam  (AZACTAM ) 1 g in sodium chloride  0.9 % 100 mL IVPB  Status:  Discontinued        1 g 200 mL/hr over 30 Minutes Intravenous Every 8 hours 08/03/24 0127 08/03/24 0127   08/03/24 0130  aztreonam  (AZACTAM ) 1 g in sodium chloride  0.9 % 100 mL IVPB  Status:  Discontinued        1 g 200 mL/hr over 30 Minutes Intravenous Every 8 hours 08/03/24 0127 08/03/24 0139        MEDICATIONS: Scheduled Meds:  buPROPion   150 mg Oral BID WC   cefUROXime   250 mg Oral BID WC   ferrous sulfate   325 mg Oral QODAY   insulin  aspart  0-6 Units Subcutaneous TID WC   insulin  glargine-yfgn  14 Units Subcutaneous BID   levothyroxine   75 mcg Oral Q0600   liver oil-zinc  oxide   Topical BID   metoCLOPramide   5 mg Oral  BID AC   pantoprazole   40 mg Oral Daily   rosuvastatin   40 mg Oral Daily   sodium bicarbonate   650 mg Oral TID   Continuous Infusions:  sodium chloride  irrigation     PRN Meds:.loperamide , prochlorperazine , traMADol    I have personally reviewed following labs and imaging studies  LABORATORY DATA: CBC: Recent Labs  Lab 08/02/24 1711 08/02/24 1730 08/03/24 0112 08/03/24 0219 08/04/24 0219  WBC  --  11.6* NURSE WILL PUT ORDER BACK IN 08/03/2024 0158 9.7 9.5  NEUTROABS  --   --  PENDING 6.7 5.3  HGB 10.9* 9.8* NURSE WILL PUT ORDER BACK IN 08/03/2024 0158 8.2* 8.9*  HCT 32.0* 29.1* NURSE WILL PUT ORDER BACK IN 08/03/2024 0158 25.2* 27.5*  MCV  --  87.1 NURSE WILL PUT ORDER  BACK IN 08/03/2024 0158 88.7 89.6  PLT  --  406* NURSE WILL PUT ORDER BACK IN 08/03/2024 0158 356 361    Basic Metabolic Panel: Recent Labs  Lab 08/02/24 1711 08/02/24 1730 08/03/24 0112 08/04/24 0219 08/05/24 0322  NA 133* 133* 134* 135 135  K 3.0* 2.9* 3.5 3.4* 4.0  CL 96* 92* 98 100 99  CO2  --  25 20* 24 25  GLUCOSE 280* 291* 300* 254* 259*  BUN 80* 89* 75* 43* 33*  CREATININE 2.00* 2.08* 1.82* 1.57* 1.50*  CALCIUM   --  9.5 8.8* 8.5* 8.8*  MG  --   --  1.7 2.4  --     GFR: CrCl cannot be calculated (Unknown ideal weight.).  Liver Function Tests: Recent Labs  Lab 08/02/24 1730  AST 20  ALT 23  ALKPHOS 160*  BILITOT 0.8  PROT 7.3  ALBUMIN  3.3*   No results for input(s): LIPASE, AMYLASE in the last 168 hours. No results for input(s): AMMONIA in the last 168 hours.  Coagulation Profile: No results for input(s): INR, PROTIME in the last 168 hours.  Cardiac Enzymes: No results for input(s): CKTOTAL, CKMB, CKMBINDEX, TROPONINI in the last 168 hours.  BNP (last 3 results) No results for input(s): PROBNP in the last 8760 hours.  Lipid Profile: No results for input(s): CHOL, HDL, LDLCALC, TRIG, CHOLHDL, LDLDIRECT in the last 72 hours.  Thyroid   Function Tests: No results for input(s): TSH, T4TOTAL, FREET4, T3FREE, THYROIDAB in the last 72 hours.  Anemia Panel: No results for input(s): VITAMINB12, FOLATE, FERRITIN, TIBC, IRON, RETICCTPCT in the last 72 hours.  Urine analysis:    Component Value Date/Time   COLORURINE RED (A) 08/02/2024 2102   APPEARANCEUR TURBID (A) 08/02/2024 2102   APPEARANCEUR Cloudy (A) 07/30/2024 1356   LABSPEC  08/02/2024 2102    TEST NOT REPORTED DUE TO COLOR INTERFERENCE OF URINE PIGMENT   PHURINE  08/02/2024 2102    TEST NOT REPORTED DUE TO COLOR INTERFERENCE OF URINE PIGMENT   GLUCOSEU (A) 08/02/2024 2102    TEST NOT REPORTED DUE TO COLOR INTERFERENCE OF URINE PIGMENT   HGBUR (A) 08/02/2024 2102    TEST NOT REPORTED DUE TO COLOR INTERFERENCE OF URINE PIGMENT   BILIRUBINUR (A) 08/02/2024 2102    TEST NOT REPORTED DUE TO COLOR INTERFERENCE OF URINE PIGMENT   BILIRUBINUR Negative 07/30/2024 1356   KETONESUR (A) 08/02/2024 2102    TEST NOT REPORTED DUE TO COLOR INTERFERENCE OF URINE PIGMENT   PROTEINUR (A) 08/02/2024 2102    TEST NOT REPORTED DUE TO COLOR INTERFERENCE OF URINE PIGMENT   UROBILINOGEN negative 10/03/2015 1154   UROBILINOGEN 0.2 03/06/2015 2158   NITRITE (A) 08/02/2024 2102    TEST NOT REPORTED DUE TO COLOR INTERFERENCE OF URINE PIGMENT   LEUKOCYTESUR (A) 08/02/2024 2102    TEST NOT REPORTED DUE TO COLOR INTERFERENCE OF URINE PIGMENT    Sepsis Labs: Lactic Acid, Venous    Component Value Date/Time   LATICACIDVEN 1.4 03/06/2015 2200    MICROBIOLOGY: Recent Results (from the past 240 hours)  Urine Culture     Status: Abnormal   Collection Time: 07/30/24  1:56 PM   Specimen: Urine   UR  Result Value Ref Range Status   Urine Culture, Routine Final report (A)  Final   Organism ID, Bacteria Proteus mirabilis (A)  Final    Comment: Cefazolin  with an MIC <=16 predicts susceptibility to the oral agents cefaclor, cefdinir, cefpodoxime, cefprozil, cefuroxime ,  cephalexin , and  loracarbef when used for therapy of uncomplicated urinary tract infections due to E. coli, Klebsiella pneumoniae, and Proteus mirabilis. Greater than 100,000 colony forming units per mL    Antimicrobial Susceptibility Comment  Final    Comment:       ** S = Susceptible; I = Intermediate; R = Resistant **                    P = Positive; N = Negative             MICS are expressed in micrograms per mL    Antibiotic                 RSLT#1    RSLT#2    RSLT#3    RSLT#4 Amoxicillin/Clavulanic Acid    S Ampicillin                     S Cefazolin                       S Cefepime                       S Cefoxitin                      S Cefpodoxime                    S Ceftriaxone                     S Ciprofloxacin                   S Ertapenem                      S Gentamicin                     S Levofloxacin                   S Meropenem                      S Nitrofurantoin                  R Piperacillin /Tazobactam        S Tetracycline                   R Tobramycin                     S Trimethoprim/Sulfa             S   Microscopic Examination     Status: Abnormal   Collection Time: 07/30/24  1:56 PM   Urine  Result Value Ref Range Status   WBC, UA 6-10 (A) 0 - 5 /hpf Final   RBC, Urine 11-30 (A) 0 - 2 /hpf Final   Epithelial Cells (non renal) None seen 0 - 10 /hpf Final   Renal Epithel, UA None seen None seen /hpf Final   Bacteria, UA Many (A) None seen/Few Final   Yeast, UA None seen None seen Final  Urine Culture     Status: None   Collection Time: 08/02/24 11:18 PM   Specimen: Urine, Clean Catch  Result Value Ref Range Status   Specimen Description URINE, CLEAN CATCH  Final   Special Requests NONE  Final  Culture   Final    NO GROWTH Performed at [facility], [address], [place], [place] [address]    Report Status 08/04/2024 FINAL  Final    RADIOLOGY STUDIES/RESULTS: No results found.    LOS: 3 days   Donalda Applebaum, MD  [facility]    To contact the attending provider between 7A-7P or the covering provider during after hours 7P-7A, please log into the web site [email or web address] and access using universal [place] password for that web site. If you do not have the password, please call the hospital operator.  08/06/2024, 9:53 AM

## 2024-08-06 NOTE — Progress Notes (Signed)
 Physical Therapy Treatment Patient Details Name: Bridget Martinez MRN: [id number] DOB: March 05, 1951 Today's Date: 08/06/2024   History of Present Illness Patient is a 73 y.o.  female presented with hematuria in the setting of UTI.  x-ray lumbar spine: Moderate compression deformity-L2. Past medical history of stage IV CKD, CAD s/p CABG, DM-2, hypothyroidism, chronic sacral decubitus ulcer.    PT Comments  Pt tolerated treatment well today. Pt with similar presentation to previous session. Min A to transfer to chair from bed. Pt still limited by low BP and high fear of falling. No change in DC/DME recs at this time. PT will continue to follow.     If plan is discharge home, recommend the following: A lot of help with walking and/or transfers;A lot of help with bathing/dressing/bathroom;Assistance with cooking/housework;Direct supervision/assist for medications management;Direct supervision/assist for financial management;Assist for transportation;Help with stairs or ramp for entrance;Supervision due to cognitive status   Can travel by private vehicle     No  Equipment Recommendations  None recommended by PT    Recommendations for Other Services       Precautions / Restrictions Precautions Precautions: Fall;Back Precaution Booklet Issued: No Recall of Precautions/Restrictions: Intact Precaution/Restrictions Comments: great fear of falling Required Braces or Orthoses: Spinal Brace Spinal Brace: Lumbar corset;Applied in sitting position;Other (comment) Spinal Brace Comments: for ambulation per active orders Restrictions Weight Bearing Restrictions Per Provider Order: No     Mobility  Bed Mobility Overal bed mobility: Needs Assistance Bed Mobility: Rolling, Sidelying to Sit Rolling: Contact guard assist, Used rails Sidelying to sit: Contact guard assist, Used rails       General bed mobility comments: cued through log roll technique, exited to the R side    Transfers Overall  transfer level: Needs assistance Equipment used: Rolling walker (2 wheels) Transfers: Sit to/from Stand, Bed to chair/wheelchair/BSC Sit to Stand: Min assist, +2 safety/equipment   Step pivot transfers: Min assist, +2 safety/equipment       General transfer comment: Stood from bed with VC for hand placement, needs A for sequencing and safe approach to chair. VC to control descent into chair and for proper hand placement.    Ambulation/Gait               General Gait Details: Deferred   Stairs             Wheelchair Mobility     Tilt Bed    Modified Rankin (Stroke Patients Only)       Balance Overall balance assessment: Needs assistance Sitting-balance support: Bilateral upper extremity supported, Feet supported Sitting balance-Leahy Scale: Fair Sitting balance - Comments: seated EOB, no LOB but reliant on UE's for support   Standing balance support: Bilateral upper extremity supported, During functional activity Standing balance-Leahy Scale: Poor Standing balance comment: reliant on RW, fearful of falling, cues for forward gaze and to relax her posture                            Communication Communication Communication: No apparent difficulties  Cognition Arousal: Alert Behavior During Therapy: WFL for tasks assessed/performed, Anxious                           PT - Cognition Comments: high fear of falling Following commands: Impaired Following commands impaired: Follows one step commands with increased time, Follows multi-step commands inconsistently    Cueing Cueing Techniques: Verbal cues, Tactile cues,  Visual cues  Exercises General Exercises - Lower Extremity Ankle Circles/Pumps: AROM, Both, 10 reps Long Arc Quad: AROM, Both, 10 reps    General Comments General comments (skin integrity, edema, etc.): BP: 91/50 seated EOB, BP: 101/69 seated in recliner      Pertinent Vitals/Pain Pain Assessment Pain Assessment:  Faces Faces Pain Scale: Hurts a little bit Pain Location: back Pain Descriptors / Indicators: Discomfort, Grimacing Pain Intervention(s): Monitored during session, Premedicated before session, Repositioned    Home Living                          Prior Function            PT Goals (current goals can now be found in the care plan section) Progress towards PT goals: Progressing toward goals    Frequency    Min 2X/week      PT Plan      Co-evaluation              AM-PAC PT 6 Clicks Mobility   Outcome Measure  Help needed turning from your back to your side while in a flat bed without using bedrails?: A Little Help needed moving from lying on your back to sitting on the side of a flat bed without using bedrails?: A Little Help needed moving to and from a bed to a chair (including a wheelchair)?: A Little Help needed standing up from a chair using your arms (e.g., wheelchair or bedside chair)?: A Little Help needed to walk in hospital room?: A Lot Help needed climbing 3-5 steps with a railing? : Total 6 Click Score: 15    End of Session Equipment Utilized During Treatment: Gait belt;Other (comment) Activity Tolerance: Patient tolerated treatment well Patient left: in chair;with call bell/phone within reach;with family/visitor present Nurse Communication: Mobility status PT Visit Diagnosis: Other abnormalities of gait and mobility (R26.89)     Time: 8799-8784 PT Time Calculation (min) (ACUTE ONLY): 15 min  Charges:    $Therapeutic Activity: 8-22 mins PT General Charges $$ ACUTE PT VISIT: 1 Visit                     Sueellen NOVAK, PT, DPT Acute Rehab Services [phone]    Bodee Lafoe 08/06/2024, 4:06 PM

## 2024-08-06 NOTE — TOC Progression Note (Addendum)
 Transition of Care [facility]) - Progression Note    Patient Details  Name: Bridget Martinez MRN: [id number] Date of Birth: August 28, 1951  [facility]) CM/SW Contact  Inocente GORMAN Kindle, LCSW Phone Number: 08/06/2024, 12:42 PM  Clinical Narrative:    [facility] has a bed available for patient pending insurance approval. CSW initiated process, Ref# [id number].   If approval received over weekend, CSW to call [place] [phone]), room 112, report # [phone].   Expected Discharge Plan: Skilled Nursing Facility Barriers to Discharge: Insurance Authorization               Expected Discharge Plan and Services In-house Referral: Clinical Social Work   Post Acute Care Choice: Skilled Nursing Facility Living arrangements for the past 2 months: Single Family Home                                       Social Drivers of Health (SDOH) Interventions SDOH Screenings   Food Insecurity: No Food Insecurity (08/03/2024)  Housing: Low Risk  (08/03/2024)  Transportation Needs: No Transportation Needs (08/03/2024)  Utilities: Not At Risk (08/03/2024)  Alcohol Screen: Low Risk  (04/07/2024)  Depression (PHQ2-9): Medium Risk (07/30/2024)  Financial Resource Strain: Low Risk  (04/07/2024)  Physical Activity: Insufficiently Active (04/07/2024)  Social Connections: Socially Isolated (08/03/2024)  Stress: No Stress Concern Present (04/07/2024)  Tobacco Use: Low Risk  (08/02/2024)  Health Literacy: Adequate Health Literacy (04/07/2024)    Readmission Risk Interventions     No data to display

## 2024-08-07 DIAGNOSIS — E1043 Type 1 diabetes mellitus with diabetic autonomic (poly)neuropathy: Secondary | ICD-10-CM | POA: Diagnosis not present

## 2024-08-07 DIAGNOSIS — G8929 Other chronic pain: Secondary | ICD-10-CM

## 2024-08-07 DIAGNOSIS — M545 Low back pain, unspecified: Secondary | ICD-10-CM | POA: Diagnosis not present

## 2024-08-07 DIAGNOSIS — N189 Chronic kidney disease, unspecified: Secondary | ICD-10-CM | POA: Diagnosis not present

## 2024-08-07 DIAGNOSIS — N3001 Acute cystitis with hematuria: Secondary | ICD-10-CM | POA: Diagnosis not present

## 2024-08-07 DIAGNOSIS — K3184 Gastroparesis: Secondary | ICD-10-CM

## 2024-08-07 LAB — CBC WITH DIFFERENTIAL/PLATELET
Abs Immature Granulocytes: 0.14 K/uL — ABNORMAL HIGH (ref 0.00–0.07)
Basophils Absolute: 0.1 K/uL (ref 0.0–0.1)
Basophils Relative: 1 %
Eosinophils Absolute: 0.3 K/uL (ref 0.0–0.5)
Eosinophils Relative: 3 %
HCT: 28 % — ABNORMAL LOW (ref 36.0–46.0)
Hemoglobin: 9.2 g/dL — ABNORMAL LOW (ref 12.0–15.0)
Immature Granulocytes: 2 %
Lymphocytes Relative: 31 %
Lymphs Abs: 2.7 K/uL (ref 0.7–4.0)
MCH: 29.5 pg (ref 26.0–34.0)
MCHC: 32.9 g/dL (ref 30.0–36.0)
MCV: 89.7 fL (ref 80.0–100.0)
Monocytes Absolute: 0.6 K/uL (ref 0.1–1.0)
Monocytes Relative: 8 %
Neutro Abs: 4.8 K/uL (ref 1.7–7.7)
Neutrophils Relative %: 55 %
Platelets: 342 K/uL (ref 150–400)
RBC: 3.12 MIL/uL — ABNORMAL LOW (ref 3.87–5.11)
RDW: 18.6 % — ABNORMAL HIGH (ref 11.5–15.5)
Smear Review: NORMAL
WBC: 8.6 K/uL (ref 4.0–10.5)
nRBC: 0.2 % (ref 0.0–0.2)

## 2024-08-07 LAB — BASIC METABOLIC PANEL WITH GFR
Anion gap: 10 (ref 5–15)
BUN: 22 mg/dL (ref 8–23)
CO2: 27 mmol/L (ref 22–32)
Calcium: 8.9 mg/dL (ref 8.9–10.3)
Chloride: 99 mmol/L (ref 98–111)
Creatinine, Ser: 1.32 mg/dL — ABNORMAL HIGH (ref 0.44–1.00)
GFR, Estimated: 42 mL/min — ABNORMAL LOW (ref >60)
Glucose, Bld: 135 mg/dL — ABNORMAL HIGH (ref 70–99)
Potassium: 3.5 mmol/L (ref 3.5–5.1)
Sodium: 136 mmol/L (ref 135–145)

## 2024-08-07 LAB — GLUCOSE, CAPILLARY
Glucose-Capillary: 173 mg/dL — ABNORMAL HIGH (ref 70–99)
Glucose-Capillary: 173 mg/dL — ABNORMAL HIGH (ref 70–99)

## 2024-08-07 LAB — MAGNESIUM: Magnesium: 1.5 mg/dL — ABNORMAL LOW (ref 1.7–2.4)

## 2024-08-07 MED ORDER — CEFUROXIME AXETIL 250 MG PO TABS: 250.0000 mg | ORAL_TABLET | Freq: Two times a day (BID) | ORAL | Status: AC

## 2024-08-07 MED ORDER — LOPERAMIDE HCL 2 MG PO CAPS: 2.0000 mg | ORAL_CAPSULE | ORAL | Status: AC | PRN

## 2024-08-07 MED ORDER — ZINC OXIDE 40 % EX OINT: TOPICAL_OINTMENT | Freq: Two times a day (BID) | CUTANEOUS | Status: AC

## 2024-08-07 MED ORDER — METOCLOPRAMIDE HCL 5 MG PO TABS: 5.0000 mg | ORAL_TABLET | Freq: Two times a day (BID) | ORAL | Status: AC

## 2024-08-07 MED ORDER — TRAMADOL HCL 50 MG PO TABS: 50.0000 mg | ORAL_TABLET | Freq: Two times a day (BID) | ORAL | 0 refills | Status: AC | PRN

## 2024-08-07 MED ORDER — LANTUS SOLOSTAR 100 UNIT/ML ~~LOC~~ SOPN: 14.0000 [IU] | PEN_INJECTOR | Freq: Two times a day (BID) | SUBCUTANEOUS | Status: AC

## 2024-08-07 NOTE — Plan of Care (Signed)

## 2024-08-07 NOTE — TOC Progression Note (Addendum)
 Transition of Care [facility]) - Progression Note    Patient Details  Name: Bridget Martinez MRN: [id number] Date of Birth: 1951/02/09  [facility]) CM/SW Contact  Inocente GORMAN Kindle, LCSW Phone Number: 08/07/2024, 8:50 AM  Clinical Narrative:    Insurance approval received for [facility], Ref# [id number], Auth ID# [id number], effective 08/06/2024-08/10/2024.   Updated Bridget Martinez with [facility] who requested CSW fax dc summary to the unit at [phone]. CSW updated patient's daughter, Bridget Martinez, and she reported agreement with [facility] for transport.    Expected Discharge Plan: Skilled Nursing Facility Barriers to Discharge: SNF Pending bed offer               Expected Discharge Plan and Services In-house Referral: Clinical Social Work   Post Acute Care Choice: Skilled Nursing Facility Living arrangements for the past 2 months: Single Family Home                                       Social Drivers of Health (SDOH) Interventions SDOH Screenings   Food Insecurity: No Food Insecurity (08/03/2024)  Housing: Low Risk  (08/03/2024)  Transportation Needs: No Transportation Needs (08/03/2024)  Utilities: Not At Risk (08/03/2024)  Alcohol Screen: Low Risk  (04/07/2024)  Depression (PHQ2-9): Medium Risk (07/30/2024)  Financial Resource Strain: Low Risk  (04/07/2024)  Physical Activity: Insufficiently Active (04/07/2024)  Social Connections: Socially Isolated (08/03/2024)  Stress: No Stress Concern Present (04/07/2024)  Tobacco Use: Low Risk  (08/02/2024)  Health Literacy: Adequate Health Literacy (04/07/2024)    Readmission Risk Interventions     No data to display

## 2024-08-07 NOTE — Discharge Summary (Signed)
 PATIENT DETAILS Name: Bridget Martinez Age: 73 y.o. Sex: female Date of Birth: [birth year] MRN: [id number]. Admitting Physician: Bradly MARLA Drones, MD ERE:[email or web address], [name], FNP  Admit Date: 08/02/2024 Discharge date: 08/07/2024  Recommendations for Outpatient Follow-up:  Follow up with PCP in 1-2 weeks Please obtain CMP/CBC in one week  Admitted From:  Home  Disposition: Skilled nursing facility   Discharge Condition: good  CODE STATUS:   Code Status: Full Code   Diet recommendation:  Diet Order             Diet - low sodium heart healthy           Diet Carb Modified           Diet Carb Modified Fluid consistency: Thin; Room service appropriate? Yes  Diet effective now                    Brief Summary: Patient is a 73 y.o.  female with history of stage IV CKD, CAD s/p CABG, DM-2, hypothyroidism, chronic sacral decubitus ulcer-presented with hematuria in the setting of UTI.   Significant events: 11/25>> admit to [facility].   Significant studies: 11/24>> x-ray lumbar spine: Moderate compression deformity-L2. 11/24>> CT abdomen/pelvis: Bladder with dependent hyperdense material consistent with thrombus-no mass identified.   Significant microbiology data: 11/21>> urine culture: Proteus mirabilis 11/24>> urine culture: No growth   Procedures: None   Consults: Urology  Brief Hospital Course: Hematuria-likely secondary to complicated UTI (Proteus Mirabella's on culture on 11/21) Hematuria has resolved with CBI Foley catheter removed on 11/26-voiding well. Has been switched from IV Rocephin  to Ceftin -EOT 12/1 Appreciate urology input.     AKI on CKD stage IIIb AKI hemodynamically mediated in the setting of hematuria/complicated UTI Has improved with supportive care-creatinine back to baseline.   Hypokalemia Repleted.   Minimally elevated troponin Likely demand ischemia Per cardiology-not a candidate for invasive workup given ongoing hematuria    HLD Statin   DM-2 with uncontrolled hyperglycemia (A1c 8.0 on 8/23) CBGs have improved and stable Continue Semglee  14 units twice daily Continue SSI Follow CBGs and optimize at SNF.    History of gastroparesis Likely related to DM Continue Reglan  Encourage small portion meals   Hypothyroidism  Synthroid    GERD PPI   Normocytic anemia Likely secondary to underlying CKD Continue oral iron supplementation-resume Epogen  injections postdischarge as previous.   Mood disorder Wellbutrin    Chronic debility/deconditioning Multiple falls-chronic L2 compression fracture As needed tramadol  TSLO brace PT/OT eval-SNF recommended.   Pressure Ulcer: Agree with assessment and plan as outlined below-note-this is POA-per patient-ongoing at least 8 weeks prior to this hospitalization-and being followed by home health RN.   Discharge Diagnoses:  Principal Problem:   Hematuria Active Problems:   Acute cystitis   Hypertension associated with diabetes (HCC)   CKD (chronic kidney disease) stage 4, GFR 15-29 ml/min (HCC)   Chronic combined systolic and diastolic congestive heart failure (HCC)   Acquired hypothyroidism   History of non-ST elevation myocardial infarction (NSTEMI)   Discharge Instructions:  Activity:  As tolerated with Full fall precautions use walker/cane & assistance as needed   Discharge Instructions     Call MD for:  difficulty breathing, headache or visual disturbances   Complete by: As directed    Call MD for:  persistant nausea and vomiting   Complete by: As directed    Call MD for:  severe uncontrolled pain   Complete by: As directed    Diet - low  sodium heart healthy   Complete by: As directed    Diet Carb Modified   Complete by: As directed    Discharge instructions   Complete by: As directed    Follow with Primary MD  [name], [name], FNP in 1-2 weeks  Please get a complete blood count and chemistry panel checked by your Primary MD at your next  visit, and again as instructed by your Primary MD.  Get Medicines reviewed and adjusted: Please take all your medications with you for your next visit with your Primary MD  Laboratory/radiological data: Please request your Primary MD to go over all hospital tests and procedure/radiological results at the follow up, please ask your Primary MD to get all Hospital records sent to his/her office.  In some cases, they will be blood work, cultures and biopsy results pending at the time of your discharge. Please request that your primary care M.D. follows up on these results.  Also Note the following: If you experience worsening of your admission symptoms, develop shortness of breath, life threatening emergency, suicidal or homicidal thoughts you must seek medical attention immediately by calling 911 or calling your MD immediately  if symptoms less severe.  You must read complete instructions/literature along with all the possible adverse reactions/side effects for all the Medicines you take and that have been prescribed to you. Take any new Medicines after you have completely understood and accpet all the possible adverse reactions/side effects.   Do not drive when taking Pain medications or sleeping medications (Benzodaizepines)  Do not take more than prescribed Pain, Sleep and Anxiety Medications. It is not advisable to combine anxiety,sleep and pain medications without talking with your primary care practitioner  Special Instructions: If you have smoked or chewed Tobacco  in the last 2 yrs please stop smoking, stop any regular Alcohol  and or any Recreational drug use.  Wear Seat belts while driving.  Please note: You were cared for by a hospitalist during your hospital stay. Once you are discharged, your primary care physician will handle any further medical issues. Please note that NO REFILLS for any discharge medications will be authorized once you are discharged, as it is imperative that you  return to your primary care physician (or establish a relationship with a primary care physician if you do not have one) for your post hospital discharge needs so that they can reassess your need for medications and monitor your lab values.   Please check CBGs before meals and at bedtime   Discharge wound care:   Complete by: As directed    1)Wound care  Until discontinued      Comments: Off-load heels at all times onto pillows/protective boots, assess skin every shift. Apply Mepilex foam dressings over buttock scar tissue areas, reposition every 2 hours or less.   2)Wound care  Every 12 hours    Comments: Cleanse wound with Vashe solution #[id number], pat dry but do not rinse, pack wound to buttock with Iodoform packing material [phone], leave long tail out, apply Desitin around periwound skin, cover with ABD pads, wide tape to secure in place.   Increase activity slowly   Complete by: As directed       Allergies as of 08/07/2024       Reactions   Penicillins Anaphylaxis, Hives   Tolerates zosyn  Has patient had a PCN reaction causing immediate rash, facial/tongue/throat swelling, SOB or lightheadedness with hypotension: Yes Has patient had a PCN reaction causing severe rash involving mucus membranes  or skin necrosis: No Has patient had a PCN reaction that required hospitalization: Yes Has patient had a PCN reaction occurring within the last 10 years: No If all of the above answers are NO, then may proceed with Cephalosporin use. ** HAS HAD CEFTRIAXONE  AND ZOSYN  IN PAST   Sulfa Antibiotics Anaphylaxis, Other (See Comments), Swelling   Stiff neck also   Macrobid  [nitrofurantoin  Monohyd Macro] Other (See Comments)   Skin peels off   Ciprofloxacin  Hcl Other (See Comments)   Skin peeling        Medication List     STOP taking these medications    cephALEXin  500 MG capsule Commonly known as: KEFLEX        TAKE these medications    aspirin  EC 81 MG tablet Take 1 tablet (81 mg  total) by mouth daily.   buPROPion  150 MG 24 hr tablet Commonly known as: WELLBUTRIN  XL Take 1 tablet (150 mg total) by mouth 2 (two) times daily.   cefUROXime  250 MG tablet Commonly known as: CEFTIN  Take 1 tablet (250 mg total) by mouth 2 (two) times daily with a meal for 2 days.   Cholecalciferol 25 MCG (1000 UT) tablet Take by mouth daily.   epoetin  alfa 20000 UNIT/ML injection Commonly known as: EPOGEN  Inject 20,000 Units into the skin See admin instructions. Inject subcutaneously every two weeks as needed based on hemoglobin level.  Provided if hemoglobin <11.   ferrous sulfate  325 (65 FE) MG EC tablet TAKE 1 TABLET BY MOUTH EVERY OTHER DAY   furosemide  20 MG tablet Commonly known as: LASIX  Take 20 mg by mouth every Thursday. Take one tablet (20mg ) by mouth weekly   Lantus  SoloStar 100 UNIT/ML Solostar Pen Generic drug: insulin  glargine Inject 14 Units into the skin 2 (two) times daily. What changed:  how much to take additional instructions   levothyroxine  75 MCG tablet Commonly known as: SYNTHROID  Take 75 mcg by mouth daily before breakfast.   liver oil-zinc  oxide 40 % ointment Commonly known as: DESITIN Apply topically 2 (two) times daily. Apply to buttock and around pressure injury wound   loperamide  2 MG capsule Commonly known as: IMODIUM  Take 1 capsule (2 mg total) by mouth as needed for diarrhea or loose stools.   metoCLOPramide  5 MG tablet Commonly known as: REGLAN  Take 1 tablet (5 mg total) by mouth 2 (two) times daily before a meal. What changed: See the new instructions.   NovoLOG  FlexPen 100 UNIT/ML FlexPen Generic drug: insulin  aspart Inject 0-6 Units into the skin 3 (three) times daily with meals. Inject per sliding scale:  0-150    0 units 151-200  2 units 201-250  3 units 251-300  4 units 301-350  5 units 351-400  6 units   Above 400- contact provider   ondansetron  4 MG tablet Commonly known as: ZOFRAN  Take 4 mg by mouth every 8  (eight) hours as needed.   pantoprazole  40 MG tablet Commonly known as: PROTONIX  TAKE 1 TABLET BY MOUTH EVERY DAY   rosuvastatin  40 MG tablet Commonly known as: CRESTOR  Take 1 tablet (40 mg total) by mouth daily.   sodium bicarbonate  650 MG tablet Take 650 mg by mouth 3 (three) times daily.   traMADol  50 MG tablet Commonly known as: ULTRAM  Take 1 tablet (50 mg total) by mouth 2 (two) times daily as needed for moderate pain (pain score 4-6).               Discharge Care Instructions  (From  admission, onward)           Start     Ordered   08/07/24 0000  Discharge wound care:       Comments: 1)Wound care  Until discontinued      Comments: Off-load heels at all times onto pillows/protective boots, assess skin every shift. Apply Mepilex foam dressings over buttock scar tissue areas, reposition every 2 hours or less.   2)Wound care  Every 12 hours    Comments: Cleanse wound with Vashe solution #[id number], pat dry but do not rinse, pack wound to buttock with Iodoform packing material [phone], leave long tail out, apply Desitin around periwound skin, cover with ABD pads, wide tape to secure in place.   08/07/24 0856            Contact information for follow-up providers     [name], [name], FNP. Schedule an appointment as soon as possible for a visit in 1 week(s).   Specialty: Family Medicine Contact information: [address] [place] [address] [phone]              Contact information for after-discharge care     Destination     [facility] .   Service: Skilled Nursing Contact information: [address] [place]  [address] [phone]                    Allergies  Allergen Reactions   Penicillins Anaphylaxis and Hives    Tolerates zosyn  Has patient had a PCN reaction causing immediate rash, facial/tongue/throat swelling, SOB or lightheadedness with hypotension: Yes Has patient had a PCN reaction causing severe  rash involving mucus membranes or skin necrosis: No Has patient had a PCN reaction that required hospitalization: Yes Has patient had a PCN reaction occurring within the last 10 years: No If all of the above answers are NO, then may proceed with Cephalosporin use.  ** HAS HAD CEFTRIAXONE  AND ZOSYN  IN PAST   Sulfa Antibiotics Anaphylaxis, Other (See Comments) and Swelling    Stiff neck also   Macrobid  [Nitrofurantoin  Monohyd Macro] Other (See Comments)    Skin peels off   Ciprofloxacin  Hcl Other (See Comments)    Skin peeling     Other Procedures/Studies: CT Renal Stone Study Result Date: 08/02/2024 EXAM: CT ABDOMEN AND PELVIS WITHOUT CONTRAST 08/02/2024 09:45:34 PM TECHNIQUE: CT of the abdomen and pelvis was performed without the administration of intravenous contrast. Multiplanar reformatted images are provided for review. Automated exposure control, iterative reconstruction, and/or weight-based adjustment of the mA/kV was utilized to reduce the radiation dose to as low as reasonably achievable. COMPARISON: 07/30/2014 CLINICAL HISTORY: Back pain and weakness, hematuria. FINDINGS: LOWER CHEST: Lung bases are clear. LIVER: The liver is within normal limits. GALLBLADDER AND BILE DUCTS: The gallbladder is within normal limits. No biliary ductal dilatation. SPLEEN: The spleen is unremarkable. PANCREAS: The pancreas is unremarkable. ADRENAL GLANDS: The adrenal glands are unremarkable. KIDNEYS, URETERS AND BLADDER: Kidneys are well visualized bilaterally. No renal calculi are seen. Fullness of the right renal pelvis is noted although no ureteral dilatation is seen. Obstructive changes are noted on the left. The bladder is well distended with diffuse hyperdense material in the dependent portion. This likely represents thrombus consistent with the given clinical history. No definitive mass is seen. GI AND BOWEL: Stomach demonstrates no acute abnormality. There is no bowel obstruction. PERITONEUM AND  RETROPERITONEUM: No ascites. No free air. VASCULATURE: Aorta is normal in caliber. LYMPH NODES: No lymphadenopathy. REPRODUCTIVE  ORGANS: The uterus is within normal limits. BONES AND SOFT TISSUES: No acute osseous abnormality. No focal soft tissue abnormality. IMPRESSION: 1. Bladder with dependent hyperdense material consistent with thrombus; no definite mass identified. Electronically signed by: Oneil Devonshire MD 08/02/2024 09:52 PM EST RP Workstation: [id number]   DG Lumbar Spine Complete Result Date: 08/02/2024 CLINICAL DATA:  Back pain after fall EXAM: DG LUMBAR SPINE COMPLETE 4+V COMPARISON:  Lumbar spine x-ray 05/01/2024 FINDINGS: The bones are diffusely osteopenic. There is moderate compression deformity of L2 which has progressed compared to the prior study. No additional fractures are seen. Alignment is anatomic. There is mild disc space narrowing and endplate osteophyte formation throughout the lumbar spine compatible with degenerative change. Left hip arthroplasty is present. IMPRESSION: 1. Moderate compression deformity of L2 which has progressed compared to the prior study. Correlate clinically for acute on chronic fracture. 2. Diffuse osteopenia. 3. Mild degenerative changes throughout the lumbar spine. Electronically Signed   By: Greig Pique M.D.   On: 08/02/2024 18:28     TODAY-DAY OF DISCHARGE:  Subjective:   Sameka Bagent today has no headache,no chest abdominal pain,no new weakness tingling or numbness, feels much better wants to go home today.  Objective:   Blood pressure (!) 116/46, pulse 87, temperature 98.8 F (37.1 C), temperature source Oral, resp. rate (!) 24, SpO2 100%.  Intake/Output Summary (Last 24 hours) at 08/07/2024 0856 Last data filed at 08/07/2024 0600 Gross per 24 hour  Intake 240 ml  Output 300 ml  Net -60 ml   There were no vitals filed for this visit.  Exam: Awake Alert, Oriented *3, No new F.N deficits, Normal affect [place].AT,PERRAL Supple Neck,No  JVD, No cervical lymphadenopathy appriciated.  Symmetrical Chest wall movement, Good air movement bilaterally, CTAB RRR,No Gallops,Rubs or new Murmurs, No Parasternal Heave +ve B.Sounds, Abd Soft, Non tender, No organomegaly appriciated, No rebound -guarding or rigidity. No Cyanosis, Clubbing or edema, No new Rash or bruise   PERTINENT RADIOLOGIC STUDIES: No results found.   PERTINENT LAB RESULTS: CBC: Recent Labs    08/07/24 0542  WBC 8.6  HGB 9.2*  HCT 28.0*  PLT 342   CMET CMP     Component Value Date/Time   NA 136 08/07/2024 0542   NA 139 11/21/2023 0927   K 3.5 08/07/2024 0542   CL 99 08/07/2024 0542   CO2 27 08/07/2024 0542   GLUCOSE 135 (H) 08/07/2024 0542   BUN 22 08/07/2024 0542   BUN 32 (H) 11/21/2023 0927   CREATININE 1.32 (H) 08/07/2024 0542   CALCIUM  8.9 08/07/2024 0542   PROT 7.3 08/02/2024 1730   PROT 6.5 11/21/2023 0927   ALBUMIN  3.3 (L) 08/02/2024 1730   ALBUMIN  4.2 11/21/2023 0927   AST 20 08/02/2024 1730   ALT 23 08/02/2024 1730   ALKPHOS 160 (H) 08/02/2024 1730   BILITOT 0.8 08/02/2024 1730   BILITOT 0.4 11/21/2023 0927   EGFR 36 (L) 11/21/2023 0927   GFRNONAA 42 (L) 08/07/2024 0542    GFR CrCl cannot be calculated (Unknown ideal weight.). No results for input(s): LIPASE, AMYLASE in the last 72 hours. No results for input(s): CKTOTAL, CKMB, CKMBINDEX, TROPONINI in the last 72 hours. Invalid input(s): POCBNP No results for input(s): DDIMER in the last 72 hours. No results for input(s): HGBA1C in the last 72 hours. No results for input(s): CHOL, HDL, LDLCALC, TRIG, CHOLHDL, LDLDIRECT in the last 72 hours. No results for input(s): TSH, T4TOTAL, T3FREE, THYROIDAB in the last 72 hours.  Invalid  input(s): FREET3 No results for input(s): VITAMINB12, FOLATE, FERRITIN, TIBC, IRON, RETICCTPCT in the last 72 hours. Coags: No results for input(s): INR in the last 72 hours.  Invalid input(s):  PT Microbiology: Recent Results (from the past 240 hours)  Urine Culture     Status: Abnormal   Collection Time: 07/30/24  1:56 PM   Specimen: Urine   UR  Result Value Ref Range Status   Urine Culture, Routine Final report (A)  Final   Organism ID, Bacteria Proteus mirabilis (A)  Final    Comment: Cefazolin  with an MIC <=16 predicts susceptibility to the oral agents cefaclor, cefdinir, cefpodoxime, cefprozil, cefuroxime , cephalexin , and loracarbef when used for therapy of uncomplicated urinary tract infections due to E. coli, Klebsiella pneumoniae, and Proteus mirabilis. Greater than 100,000 colony forming units per mL    Antimicrobial Susceptibility Comment  Final    Comment:       ** S = Susceptible; I = Intermediate; R = Resistant **                    P = Positive; N = Negative             MICS are expressed in micrograms per mL    Antibiotic                 RSLT#1    RSLT#2    RSLT#3    RSLT#4 Amoxicillin/Clavulanic Acid    S Ampicillin                     S Cefazolin                       S Cefepime                       S Cefoxitin                      S Cefpodoxime                    S Ceftriaxone                     S Ciprofloxacin                   S Ertapenem                      S Gentamicin                     S Levofloxacin                   S Meropenem                      S Nitrofurantoin                  R Piperacillin /Tazobactam        S Tetracycline                   R Tobramycin                     S Trimethoprim/Sulfa             S   Microscopic Examination     Status: Abnormal   Collection Time: 07/30/24  1:56 PM   Urine  Result Value  Ref Range Status   WBC, UA 6-10 (A) 0 - 5 /hpf Final   RBC, Urine 11-30 (A) 0 - 2 /hpf Final   Epithelial Cells (non renal) None seen 0 - 10 /hpf Final   Renal Epithel, UA None seen None seen /hpf Final   Bacteria, UA Many (A) None seen/Few Final   Yeast, UA None seen None seen Final  Urine Culture     Status: None    Collection Time: 08/02/24 11:18 PM   Specimen: Urine, Clean Catch  Result Value Ref Range Status   Specimen Description URINE, CLEAN CATCH  Final   Special Requests NONE  Final   Culture   Final    NO GROWTH Performed at [facility], [address], [place], [place] [address]    Report Status 08/04/2024 FINAL  Final    FURTHER DISCHARGE INSTRUCTIONS:  Get Medicines reviewed and adjusted: Please take all your medications with you for your next visit with your Primary MD  Laboratory/radiological data: Please request your Primary MD to go over all hospital tests and procedure/radiological results at the follow up, please ask your Primary MD to get all Hospital records sent to his/her office.  In some cases, they will be blood work, cultures and biopsy results pending at the time of your discharge. Please request that your primary care M.D. goes through all the records of your hospital data and follows up on these results.  Also Note the following: If you experience worsening of your admission symptoms, develop shortness of breath, life threatening emergency, suicidal or homicidal thoughts you must seek medical attention immediately by calling 911 or calling your MD immediately  if symptoms less severe.  You must read complete instructions/literature along with all the possible adverse reactions/side effects for all the Medicines you take and that have been prescribed to you. Take any new Medicines after you have completely understood and accpet all the possible adverse reactions/side effects.   Do not drive when taking Pain medications or sleeping medications (Benzodaizepines)  Do not take more than prescribed Pain, Sleep and Anxiety Medications. It is not advisable to combine anxiety,sleep and pain medications without talking with your primary care practitioner  Special Instructions: If you have smoked or chewed Tobacco  in the last 2 yrs please stop smoking, stop any regular  Alcohol  and or any Recreational drug use.  Wear Seat belts while driving.  Please note: You were cared for by a hospitalist during your hospital stay. Once you are discharged, your primary care physician will handle any further medical issues. Please note that NO REFILLS for any discharge medications will be authorized once you are discharged, as it is imperative that you return to your primary care physician (or establish a relationship with a primary care physician if you do not have one) for your post hospital discharge needs so that they can reassess your need for medications and monitor your lab values.  Total Time spent coordinating discharge including counseling, education and face to face time equals greater than 30 minutes.  SignedBETHA Donalda Applebaum 08/07/2024 8:56 AM

## 2024-08-07 NOTE — TOC Transition Note (Signed)
 Transition of Care [facility]) - Discharge Note   Patient Details  Name: Bridget Martinez MRN: [id number] Date of Birth: 1950-12-02  Transition of Care [facility]) CM/SW Contact:  Inocente GORMAN Kindle, LCSW Phone Number: 08/07/2024, 11:28 AM   Clinical Narrative:    Patient will DC to: [facility] Anticipated DC date: 08/07/24 Family notified: Daughter, Bridget Martinez Transport by: ROME   Per MD patient ready for DC to [facility]. RN to call report prior to discharge (room 112, report # [phone]). RN, patient, patient's family, and facility notified of DC. Discharge Summary and FL2 sent to facility. DC packet on chart including signed script. Ambulance transport requested for patient.   CSW will sign off for now as social work intervention is no longer needed. Please consult us  again if new needs arise.     Final next level of care: Skilled Nursing Facility Barriers to Discharge: Barriers Resolved   Patient Goals and CMS Choice Patient states their goals for this hospitalization and ongoing recovery are:: Achieve SNF placement at [facility] CMS Medicare.gov Compare Post Acute Care list provided to:: Patient Choice offered to / list presented to : Patient [place] ownership interest in [facility].provided to:: Patient    Discharge Placement   Existing PASRR number confirmed : 08/07/24          Patient chooses bed at: [facility] at [facility] Patient to be transferred to facility by: [facility] Name of family member notified: Daughter Bridget Martinez Patient and family notified of of transfer: 08/07/24  Discharge Plan and Services Additional resources added to the After Visit Summary for   In-house Referral: Clinical Social Work   Post Acute Care Choice: Skilled Nursing Facility                               Social Drivers of Health (SDOH) Interventions SDOH Screenings   Food Insecurity: No Food Insecurity (08/03/2024)  Housing: Low Risk  (08/03/2024)  Transportation Needs: No  Transportation Needs (08/03/2024)  Utilities: Not At Risk (08/03/2024)  Alcohol Screen: Low Risk  (04/07/2024)  Depression (PHQ2-9): Medium Risk (07/30/2024)  Financial Resource Strain: Low Risk  (04/07/2024)  Physical Activity: Insufficiently Active (04/07/2024)  Social Connections: Socially Isolated (08/03/2024)  Stress: No Stress Concern Present (04/07/2024)  Tobacco Use: Low Risk  (08/02/2024)  Health Literacy: Adequate Health Literacy (04/07/2024)     Readmission Risk Interventions     No data to display

## 2024-08-09 DIAGNOSIS — D649 Anemia, unspecified: Secondary | ICD-10-CM | POA: Diagnosis not present

## 2024-08-09 DIAGNOSIS — E119 Type 2 diabetes mellitus without complications: Secondary | ICD-10-CM | POA: Diagnosis not present

## 2024-08-09 DIAGNOSIS — I509 Heart failure, unspecified: Secondary | ICD-10-CM | POA: Diagnosis not present

## 2024-08-09 DIAGNOSIS — L89154 Pressure ulcer of sacral region, stage 4: Secondary | ICD-10-CM | POA: Diagnosis not present

## 2024-08-09 NOTE — Telephone Encounter (Signed)
Lmtcb ? ?Need more information  ?

## 2024-08-09 NOTE — Care Management Important Message (Signed)
 Important Message  Patient Details  Name: Bridget Martinez MRN: [id number] Date of Birth: 1951/06/28   Important Message Given:  No     [name] 08/09/2024, 8:57 AM

## 2024-08-09 NOTE — Telephone Encounter (Signed)
 Contacted patient- Bridget Martinez

## 2024-08-09 NOTE — Progress Notes (Signed)
 Patient discharged, Important Message Letter mailed to patient.

## 2024-08-11 ENCOUNTER — Other Ambulatory Visit: Payer: Self-pay

## 2024-08-11 DIAGNOSIS — D631 Anemia in chronic kidney disease: Secondary | ICD-10-CM

## 2024-08-12 ENCOUNTER — Inpatient Hospital Stay: Attending: Hematology

## 2024-08-12 ENCOUNTER — Inpatient Hospital Stay

## 2024-08-13 NOTE — Telephone Encounter (Signed)
 3rd attempt to contact patient.  Left message to call back. Phone encounter closed.

## 2024-08-24 NOTE — Progress Notes (Signed)
" ° °  07/28/2024 Name: Bridget Martinez MRN: [id number] DOB: 26-Nov-1950  Chief Complaint  Patient presents with   Osteoporosis    Bridget Martinez is a 73 y.o. year old female who presented for a telephone visit.  Patient did not answer, therefore we will reach out to reschedule.   They were referred to the pharmacist by their PCP for assistance in managing medication access and osteoporosis .    Subjective:  Bridget Martinez is a 73 year old female with osteoporosis and renal failure who presents with a recent fall resulting in multiple fractures.   Traumatic fractures - Sustained a fall in the kitchen resulting in a pelvis fracture.  - Initially minimal discomfort, with pain intensifying by Friday - Resultant fractures: lower left pelvis, lower left rib, compression fractures at T5 and L2 - No surgical intervention required - Was admitted to the hospital from 05/01/2024-05/05/2022, then went to a SNF. She was discharged home on Friday.  Discuss potential use of Prolia (denosumab) as a treatment option, considering insurance coverage and renal function. - Consider future DEXA scan when she is able to tolerate the procedure.  -No T scores available  Care Team: Primary Care Provider: Severa [name], FNP   Medication Access/Adherence  Current Pharmacy:  [id number] - [place] - [address] [place] [address] Phone: [phone] Fax: [phone]  [facility] [address], [place] - [address] [place] [address] Phone: [phone] Fax: [phone]   Osteoporosis:  Current medications: n/a-caution GFR 34 Medications tried in the past: alendronate  (unsure of start/stop-but EPIC tracing back to 2014-16)  Current supplements: n/a  Current physical activity: limited due to fall/fractures  Most recent DEXA: patient unable to tolerate DEXA   Current medication access support:  n/a   Objective:  Lab Results  Component Value Date   HGBA1C 8.0 (H) 05/01/2024    Lab Results  Component Value Date   CREATININE 1.32 (H) 08/07/2024   BUN 22 08/07/2024   NA 136 08/07/2024   K 3.5 08/07/2024   CL 99 08/07/2024   CO2 27 08/07/2024    Lab Results  Component Value Date   CHOL 132 11/21/2023   HDL 73 11/21/2023   LDLCALC 48 11/21/2023   TRIG 45 11/21/2023   CHOLHDL 1.8 11/21/2023    Medications Reviewed Today   Medications were not reviewed in this encounter       Assessment/Plan:   Osteoporosis: - Currently inappropriately managed/opportunity for optimization - Reviewed recommendation for daily calcium  intake of 1200 mg and vitamin D  intake of 530 115 0290 units - Recommended to choose calcium  citrate formulation due to concurrent acid reflux medication    Follow Up Plan: patient no show; will reschedule  Mliss Tarry Griffin, PharmD, BCACP, CPP Clinical Pharmacist, [facility]   This encounter was created in error - please disregard. "

## 2024-08-26 ENCOUNTER — Inpatient Hospital Stay

## 2024-08-26 ENCOUNTER — Inpatient Hospital Stay: Attending: Hematology

## 2024-08-31 ENCOUNTER — Telehealth: Payer: Self-pay | Admitting: Family Medicine

## 2024-08-31 NOTE — Telephone Encounter (Signed)
 Copied from CRM #[id number]. Topic: General - Other >> Aug 31, 2024  1:14 PM Wess RAMAN wrote: Reason for CRM: Rocky from [facility] would like to know if [name], Rock will be following patient with personal home care orders  Callback #: [phone]

## 2024-09-06 ENCOUNTER — Encounter: Payer: Self-pay | Admitting: *Deleted

## 2024-09-06 ENCOUNTER — Telehealth: Payer: Self-pay | Admitting: Family Medicine

## 2024-09-06 NOTE — Telephone Encounter (Signed)
Verbal given on VM.  

## 2024-09-06 NOTE — Telephone Encounter (Unsigned)
 Copied from CRM #[id number]. Topic: Clinical - Home Health Verbal Orders >> Sep 06, 2024  9:46 AM Cherylann RAMAN wrote: Caller/Agency: [facility] Callback Number: [phone] VM confidential may leave detailed msg Service Requested: Physical Therapy Frequency: 1 week 8 Any new concerns about the patient? No

## 2024-09-07 ENCOUNTER — Telehealth: Payer: Self-pay

## 2024-09-07 NOTE — Progress Notes (Signed)
 Care Guide Pharmacy Note  09/07/2024 Name: Bridget Martinez MRN: [id number] DOB: 04-24-1951  Referred By: Severa [name], FNP Reason for referral: Complex Care Management (Outreach to schedule with Pharm d )   Bridget Martinez is a 73 y.o. year old female who is a primary care patient of [name], [name], FNP.  Bridget Martinez was referred to the pharmacist for assistance related to: osteoporosis  An unsuccessful telephone outreach was attempted today to contact the patient who was referred to the pharmacy team for assistance with medication management. Additional attempts will be made to contact the patient.  Jeoffrey Buffalo , RMA     [facility]  [facility], [facility] Guide  Direct Dial: [phone]  Website: [email or web address]

## 2024-09-10 ENCOUNTER — Inpatient Hospital Stay: Attending: Hematology

## 2024-09-10 ENCOUNTER — Telehealth: Payer: Self-pay

## 2024-09-10 VITALS — BP 98/51 | HR 74 | Resp 18

## 2024-09-10 DIAGNOSIS — N184 Chronic kidney disease, stage 4 (severe): Secondary | ICD-10-CM | POA: Diagnosis present

## 2024-09-10 DIAGNOSIS — D631 Anemia in chronic kidney disease: Secondary | ICD-10-CM

## 2024-09-10 LAB — CBC
HCT: 33.5 % — ABNORMAL LOW (ref 36.0–46.0)
Hemoglobin: 10.8 g/dL — ABNORMAL LOW (ref 12.0–15.0)
MCH: 30.5 pg (ref 26.0–34.0)
MCHC: 32.2 g/dL (ref 30.0–36.0)
MCV: 94.6 fL (ref 80.0–100.0)
Platelets: 325 K/uL (ref 150–400)
RBC: 3.54 MIL/uL — ABNORMAL LOW (ref 3.87–5.11)
RDW: 18.2 % — ABNORMAL HIGH (ref 11.5–15.5)
WBC: 8.9 K/uL (ref 4.0–10.5)
nRBC: 0 % (ref 0.0–0.2)

## 2024-09-10 MED ORDER — EPOETIN ALFA-EPBX 20000 UNIT/ML IJ SOLN
20000.0000 [IU] | Freq: Once | INTRAMUSCULAR | Status: AC
  Administered 2024-09-10: 20000 [IU] via SUBCUTANEOUS
  Filled 2024-09-10: qty 1

## 2024-09-10 NOTE — Telephone Encounter (Signed)
 Lmtcb   Need more information-  Do they need a verbal for PT? Or an appointment to be seen for the ankle swelling?

## 2024-09-10 NOTE — Progress Notes (Signed)
Patient presents today for Retacrit injection. Hemoglobin reviewed prior to administration. VSS tolerated without incident or complaint. See MAR for details. Patient stable during and after injection. Patient discharged in satisfactory condition with no s/s of distress noted.  

## 2024-09-10 NOTE — Telephone Encounter (Signed)
 Copied from CRM #[id number]. Topic: Clinical - Home Health Verbal Orders >> Sep 08, 2024  3:09 PM Everette C wrote: Caller/Agency: Hildegard IVER Hedda Mitch Number: [phone] Service Requested: Physical Therapy Any new concerns about the patient? Yes Pain,swelling and redness of right ankle.

## 2024-09-10 NOTE — Patient Instructions (Signed)

## 2024-09-13 NOTE — Telephone Encounter (Signed)
 TC back to Rocky reinhold Cella She was calling back if PCP would be following for [facility] orders which they have already received VO

## 2024-09-14 ENCOUNTER — Ambulatory Visit

## 2024-09-14 DIAGNOSIS — E1165 Type 2 diabetes mellitus with hyperglycemia: Secondary | ICD-10-CM | POA: Diagnosis not present

## 2024-09-14 DIAGNOSIS — N184 Chronic kidney disease, stage 4 (severe): Secondary | ICD-10-CM | POA: Diagnosis not present

## 2024-09-14 DIAGNOSIS — D631 Anemia in chronic kidney disease: Secondary | ICD-10-CM

## 2024-09-14 DIAGNOSIS — I5042 Chronic combined systolic (congestive) and diastolic (congestive) heart failure: Secondary | ICD-10-CM

## 2024-09-14 DIAGNOSIS — N179 Acute kidney failure, unspecified: Secondary | ICD-10-CM | POA: Diagnosis not present

## 2024-09-14 DIAGNOSIS — L89153 Pressure ulcer of sacral region, stage 3: Secondary | ICD-10-CM

## 2024-09-14 DIAGNOSIS — B964 Proteus (mirabilis) (morganii) as the cause of diseases classified elsewhere: Secondary | ICD-10-CM | POA: Diagnosis not present

## 2024-09-14 DIAGNOSIS — I130 Hypertensive heart and chronic kidney disease with heart failure and stage 1 through stage 4 chronic kidney disease, or unspecified chronic kidney disease: Secondary | ICD-10-CM | POA: Diagnosis not present

## 2024-09-14 DIAGNOSIS — E1122 Type 2 diabetes mellitus with diabetic chronic kidney disease: Secondary | ICD-10-CM | POA: Diagnosis not present

## 2024-09-14 DIAGNOSIS — I2510 Atherosclerotic heart disease of native coronary artery without angina pectoris: Secondary | ICD-10-CM | POA: Diagnosis not present

## 2024-09-14 DIAGNOSIS — F418 Other specified anxiety disorders: Secondary | ICD-10-CM

## 2024-09-14 DIAGNOSIS — N3001 Acute cystitis with hematuria: Secondary | ICD-10-CM

## 2024-09-16 NOTE — Telephone Encounter (Signed)
 Spoke with Hildegard from [facility]. This was an [employer]. She has seen the patient since and the swelling has gone down. Patient is up and walking. She will advise patient to make an appointment if condition worsens.

## 2024-09-22 ENCOUNTER — Other Ambulatory Visit: Payer: Self-pay

## 2024-09-22 DIAGNOSIS — N184 Chronic kidney disease, stage 4 (severe): Secondary | ICD-10-CM

## 2024-09-22 NOTE — Progress Notes (Signed)
 Care Guide Pharmacy Note  09/22/2024 Name: CEILI BOSHERS MRN: [id number] DOB: [birth year]  Referred By: Severa [name], FNP Reason for referral: Complex Care Management (Outreach to schedule with Pharm d )   NACOLE FLUHR is a 74 y.o. year old female who is a primary care patient of [name], [name], FNP.  Zebedee LULLA Corona was referred to the pharmacist for assistance related to: osteoporosis   Successful contact was made with the patient to discuss pharmacy services.  Patient declines engagement at this time. Contact information was provided to the patient should they wish to reach out for assistance at a later time.  Jeoffrey Buffalo , RMA     [facility]  [facility], [facility] Guide  Direct Dial: [phone]  Website: [email or web address]

## 2024-09-24 ENCOUNTER — Inpatient Hospital Stay

## 2024-09-24 VITALS — BP 118/45 | HR 75 | Temp 97.3°F | Resp 18

## 2024-09-24 DIAGNOSIS — N184 Chronic kidney disease, stage 4 (severe): Secondary | ICD-10-CM

## 2024-09-24 LAB — CBC WITH DIFFERENTIAL/PLATELET
Abs Immature Granulocytes: 0.02 K/uL (ref 0.00–0.07)
Basophils Absolute: 0.1 K/uL (ref 0.0–0.1)
Basophils Relative: 1 %
Eosinophils Absolute: 0.1 K/uL (ref 0.0–0.5)
Eosinophils Relative: 2 %
HCT: 33 % — ABNORMAL LOW (ref 36.0–46.0)
Hemoglobin: 10.8 g/dL — ABNORMAL LOW (ref 12.0–15.0)
Immature Granulocytes: 0 %
Lymphocytes Relative: 14 %
Lymphs Abs: 1 K/uL (ref 0.7–4.0)
MCH: 30.6 pg (ref 26.0–34.0)
MCHC: 32.7 g/dL (ref 30.0–36.0)
MCV: 93.5 fL (ref 80.0–100.0)
Monocytes Absolute: 0.6 K/uL (ref 0.1–1.0)
Monocytes Relative: 9 %
Neutro Abs: 5.2 K/uL (ref 1.7–7.7)
Neutrophils Relative %: 74 %
Platelets: 321 K/uL (ref 150–400)
RBC: 3.53 MIL/uL — ABNORMAL LOW (ref 3.87–5.11)
RDW: 17.8 % — ABNORMAL HIGH (ref 11.5–15.5)
WBC: 7 K/uL (ref 4.0–10.5)
nRBC: 0.3 % — ABNORMAL HIGH (ref 0.0–0.2)

## 2024-09-24 MED ORDER — EPOETIN ALFA-EPBX 20000 UNIT/ML IJ SOLN
20000.0000 [IU] | Freq: Once | INTRAMUSCULAR | Status: AC
  Administered 2024-09-24: 20000 [IU] via SUBCUTANEOUS
  Filled 2024-09-24: qty 1

## 2024-09-24 NOTE — Progress Notes (Signed)
 Patient's Hgb 10.8 and blood pressure stable. Patient tolerated Retacrit  injection with no complaints voiced.  Site clean and dry with no bruising or swelling noted at site.  See MAR for details.  Band aid applied.  Patient stable during and after injection.  Vss with discharge and left in satisfactory condition with no s/s of distress noted. All follow ups as scheduled.   Don Giarrusso

## 2024-10-07 ENCOUNTER — Other Ambulatory Visit: Payer: Self-pay

## 2024-10-07 DIAGNOSIS — D631 Anemia in chronic kidney disease: Secondary | ICD-10-CM

## 2024-10-07 DIAGNOSIS — N184 Chronic kidney disease, stage 4 (severe): Secondary | ICD-10-CM

## 2024-10-08 ENCOUNTER — Inpatient Hospital Stay: Admitting: Oncology

## 2024-10-08 ENCOUNTER — Inpatient Hospital Stay

## 2024-10-08 VITALS — BP 126/56 | HR 79 | Temp 98.5°F | Resp 18 | Wt 121.2 lb

## 2024-10-08 DIAGNOSIS — D631 Anemia in chronic kidney disease: Secondary | ICD-10-CM | POA: Diagnosis not present

## 2024-10-08 DIAGNOSIS — N184 Chronic kidney disease, stage 4 (severe): Secondary | ICD-10-CM

## 2024-10-08 LAB — CBC WITH DIFFERENTIAL/PLATELET
Abs Immature Granulocytes: 0.01 10*3/uL (ref 0.00–0.07)
Basophils Absolute: 0 10*3/uL (ref 0.0–0.1)
Basophils Relative: 1 %
Eosinophils Absolute: 0.1 10*3/uL (ref 0.0–0.5)
Eosinophils Relative: 2 %
HCT: 34 % — ABNORMAL LOW (ref 36.0–46.0)
Hemoglobin: 11.2 g/dL — ABNORMAL LOW (ref 12.0–15.0)
Immature Granulocytes: 0 %
Lymphocytes Relative: 19 %
Lymphs Abs: 1.1 10*3/uL (ref 0.7–4.0)
MCH: 30.4 pg (ref 26.0–34.0)
MCHC: 32.9 g/dL (ref 30.0–36.0)
MCV: 92.1 fL (ref 80.0–100.0)
Monocytes Absolute: 0.6 10*3/uL (ref 0.1–1.0)
Monocytes Relative: 10 %
Neutro Abs: 4 10*3/uL (ref 1.7–7.7)
Neutrophils Relative %: 68 %
Platelets: 328 10*3/uL (ref 150–400)
RBC: 3.69 MIL/uL — ABNORMAL LOW (ref 3.87–5.11)
RDW: 17.4 % — ABNORMAL HIGH (ref 11.5–15.5)
WBC: 5.9 10*3/uL (ref 4.0–10.5)
nRBC: 0 % (ref 0.0–0.2)

## 2024-10-08 MED ORDER — EPOETIN ALFA-EPBX 20000 UNIT/ML IJ SOLN: 20000.0000 [IU] | Freq: Once | INTRAMUSCULAR | Status: DC

## 2024-10-08 NOTE — Progress Notes (Signed)
 " [facility] at [facility]  HEMATOLOGY FOLLOW-UP VISIT  [name], [name], FNP  REASON FOR FOLLOW-UP: Anemia of chronic kidney disease  ASSESSMENT & PLAN:  Patient is a 74 y.o. female with chronic kidney disease following for anemia of chronic kidney disease.  Currently on EPO shots   Assessment and Plan Assessment & Plan Anemia of chronic kidney disease Anemia secondary to chronic kidney disease was well-controlled with stable hemoglobin within target range. She received consistent epoetin  alfa injections without complications or evidence of gastrointestinal bleeding or malignancy.  - Continued epoetin  alfa injections every two weeks. - Monitored hemoglobin; if levels remain above 11 g/dL, considered extending injection interval to every three to four weeks. - Iron studies to be performed at next visit to assess iron status and ensure adequate stores for erythropoiesis. - Continued ferrous sulfate  supplementation every other day. -Hb 11.2 today. Will hold ESA today.  Return to clinic in 3 months with labs  Impaired mobility due to recent vertebral and pelvic fractures Impaired mobility was secondary to vertebral compression fractures and a pelvic fracture from a recent fall. Persistent back pain was reported, but she recovered from the acute event.   Chronic kidney disease Chronic kidney disease was well-managed with favorable nephrology report.      Orders Placed This Encounter  Procedures   Ferritin    Standing Status:   Future    Expected Date:   12/17/2024    Expiration Date:   03/17/2025   Folate    Standing Status:   Future    Expected Date:   12/17/2024    Expiration Date:   03/17/2025   Vitamin B12    Standing Status:   Future    Expected Date:   12/17/2024    Expiration Date:   03/17/2025   CBC with Differential/Platelet    Standing Status:   Future    Expected Date:   12/17/2024    Expiration Date:   03/17/2025   Comprehensive metabolic panel  with GFR    Standing Status:   Future    Expected Date:   12/17/2024    Expiration Date:   03/17/2025   Iron and TIBC    Standing Status:   Future    Expected Date:   12/17/2024    Expiration Date:   03/17/2025   [facility] COMMUNICATION INJECTION    Injection Appt. 15 mins   SCHEDULING COMMUNICATION LAB    Lab Appointment 15 Minutes   Treatment conditions    Hold Epogen /Procrit/Retacrit :Chemotherapy Induced Anemia, Hold for hemoglobin greater than 10/Renal hold for hemoglobin greater than 11.   TREATMENT CONDITIONS    Notify MD for SBP > 180.    The total time spent in the appointment was 14 minutes encounter with patients including review of chart and various tests results, discussions about plan of care and coordination of care plan   All questions were answered. The patient knows to call the clinic with any problems, questions or concerns. No barriers to learning was detected.  Mickiel Dry, MD 1/30/20264:02 PM    INTERVAL HISTORY: Discussed the use of AI scribe software for clinical note transcription with the patient, who gave verbal consent to proceed.  History of Present Illness [name] is a 74 year old female with anemia secondary to chronic kidney disease who presents for hematology follow-up and anemia management. She is accompanied by her daughter today.   She reports feeling well overall and denies new symptoms, including fatigue or  gastrointestinal bleeding. She continues to experience chronic back pain, which has been ongoing since her fall at the end of November. She notes mild pedal edema and a recent three-pound weight gain.  She was hospitalized at the end of November and subsequently resided at a nursing facility for three to four weeks, during which she continued scheduled anemia treatment. She received injections in both the hospital and nursing home. She is currently receiving epoetin  alfa injections every two weeks and takes an iron supplement every  other day. Her hemoglobin has remained stable, with a value of 11.2 today and 10.8 at her last two appointments.  She is under nephrology care and takes furosemide  once weekly, which she tolerates well.   I have reviewed the past medical history, past surgical history, social history and family history with the patient   ALLERGIES:  is allergic to penicillins, sulfa antibiotics, macrobid  [nitrofurantoin  monohyd macro], and ciprofloxacin  hcl.  MEDICATIONS:  Current Outpatient Medications  Medication Sig Dispense Refill   aspirin  81 MG EC tablet Take 1 tablet (81 mg total) by mouth daily. 30 tablet 3   buPROPion  (WELLBUTRIN  XL) 150 MG 24 hr tablet Take 1 tablet (150 mg total) by mouth 2 (two) times daily. 180 tablet 1   Cholecalciferol 25 MCG (1000 UT) tablet Take by mouth daily.     epoetin  alfa (EPOGEN ) 20000 UNIT/ML injection Inject 20,000 Units into the skin See admin instructions. Inject subcutaneously every two weeks as needed based on hemoglobin level.  Provided if hemoglobin <11.     ferrous sulfate  325 (65 FE) MG EC tablet TAKE 1 TABLET BY MOUTH EVERY OTHER DAY 45 tablet 3   furosemide  (LASIX ) 20 MG tablet Take 20 mg by mouth every Thursday. Take one tablet (20mg ) by mouth weekly     insulin  aspart (NOVOLOG  FLEXPEN) 100 UNIT/ML FlexPen Inject 0-6 Units into the skin 3 (three) times daily with meals. Inject per sliding scale:  0-150    0 units 151-200  2 units 201-250  3 units 251-300  4 units 301-350  5 units 351-400  6 units   Above 400- contact provider     LANTUS  SOLOSTAR 100 UNIT/ML Solostar Pen Inject 14 Units into the skin 2 (two) times daily.     levothyroxine  (SYNTHROID ) 75 MCG tablet Take 75 mcg by mouth daily before breakfast.     liver oil-zinc  oxide (DESITIN) 40 % ointment Apply topically 2 (two) times daily. Apply to buttock and around pressure injury wound     loperamide  (IMODIUM ) 2 MG capsule Take 1 capsule (2 mg total) by mouth as needed for diarrhea or loose  stools.     metoCLOPramide  (REGLAN ) 5 MG tablet Take 1 tablet (5 mg total) by mouth 2 (two) times daily before a meal.     ondansetron  (ZOFRAN ) 4 MG tablet Take 4 mg by mouth every 8 (eight) hours as needed.     pantoprazole  (PROTONIX ) 40 MG tablet TAKE 1 TABLET BY MOUTH EVERY DAY 90 tablet 1   rosuvastatin  (CRESTOR ) 40 MG tablet Take 1 tablet (40 mg total) by mouth daily. 90 tablet 1   sodium bicarbonate  650 MG tablet Take 650 mg by mouth 3 (three) times daily.     traMADol  (ULTRAM ) 50 MG tablet Take 1 tablet (50 mg total) by mouth 2 (two) times daily as needed for moderate pain (pain score 4-6). 10 tablet 0   No current facility-administered medications for this visit.   PHYSICAL EXAMINATION:   Vitals:  10/08/24 1005  BP: (!) 126/56  Pulse: 79  Resp: 18  Temp: 98.5 F (36.9 C)  SpO2: 99%     GENERAL:alert, no distress and comfortable, frail female SKIN: skin color, texture, turgor are normal, no rashes or significant lesions LUNGS: clear to auscultation and percussion with normal breathing effort HEART: regular rate & rhythm and no murmurs and no lower extremity edema ABDOMEN:abdomen soft, non-tender and normal bowel sounds Musculoskeletal:no cyanosis of digits and no clubbing  NEURO: alert & oriented x 3 with fluent speech  LABORATORY DATA:  I have reviewed the data as listed  Lab Results  Component Value Date   WBC 5.9 10/08/2024   NEUTROABS 4.0 10/08/2024   HGB 11.2 (L) 10/08/2024   HCT 34.0 (L) 10/08/2024   MCV 92.1 10/08/2024   PLT 328 10/08/2024       Chemistry      Component Value Date/Time   NA 136 08/07/2024 0542   NA 139 11/21/2023 0927   K 3.5 08/07/2024 0542   CL 99 08/07/2024 0542   CO2 27 08/07/2024 0542   BUN 22 08/07/2024 0542   BUN 32 (H) 11/21/2023 0927   CREATININE 1.32 (H) 08/07/2024 0542   GLU 416 01/18/2022 0000      Component Value Date/Time   CALCIUM  8.9 08/07/2024 0542   ALKPHOS 160 (H) 08/02/2024 1730   AST 20 08/02/2024 1730    ALT 23 08/02/2024 1730   BILITOT 0.8 08/02/2024 1730   BILITOT 0.4 11/21/2023 0927      Latest Reference Range & Units 10/22/23 14:10  Total Protein ELP 6.0 - 8.5 g/dL 6.4 (C)  Albumin  SerPl Elph-Mcnc 2.9 - 4.4 g/dL 3.6 (C)  Albumin /Glob SerPl 0.7 - 1.7  1.3 (C)  Alpha2 Glob SerPl Elph-Mcnc 0.4 - 1.0 g/dL 0.7 (C)  Alpha 1 0.0 - 0.4 g/dL 0.2 (C)  Gamma Glob SerPl Elph-Mcnc 0.4 - 1.8 g/dL 1.0 (C)  M Protein SerPl Elph-Mcnc Not Observed g/dL Not Observed (C)  IFE 1  Comment (C)  Globulin, Total 2.2 - 3.9 g/dL 2.8 (C)  B-Globulin SerPl Elph-Mcnc 0.7 - 1.3 g/dL 0.9 (C)  IgG (Immunoglobin G), Serum 586 - 1,602 mg/dL 8,978  IgM (Immunoglobulin M), Srm 26 - 217 mg/dL 88  IgA 64 - 577 mg/dL 767  (C): Corrected   Latest Reference Range & Units 07/01/24 09:07  Iron 28 - 170 ug/dL 70  UIBC ug/dL 789  TIBC 749 - 549 ug/dL 719  Saturation Ratios 10.4 - 31.8 % 25  Ferritin 11 - 307 ng/mL 858 (H)  Folate >5.9 ng/mL 15.8  Vitamin B12 180 - 914 pg/mL 1,337 (H)  (H): Data is abnormally high  "

## 2024-10-08 NOTE — Progress Notes (Addendum)
 No injection today due to hemoglobin of 11.2

## 2024-10-22 ENCOUNTER — Inpatient Hospital Stay

## 2024-10-22 ENCOUNTER — Inpatient Hospital Stay: Attending: Hematology

## 2024-11-05 ENCOUNTER — Inpatient Hospital Stay

## 2024-11-19 ENCOUNTER — Inpatient Hospital Stay

## 2024-11-19 ENCOUNTER — Inpatient Hospital Stay: Attending: Hematology

## 2024-12-03 ENCOUNTER — Inpatient Hospital Stay

## 2024-12-17 ENCOUNTER — Inpatient Hospital Stay: Attending: Hematology

## 2024-12-17 ENCOUNTER — Inpatient Hospital Stay

## 2024-12-24 ENCOUNTER — Ambulatory Visit: Payer: Self-pay | Admitting: Family Medicine

## 2024-12-31 ENCOUNTER — Inpatient Hospital Stay

## 2025-01-04 ENCOUNTER — Inpatient Hospital Stay

## 2025-01-04 ENCOUNTER — Inpatient Hospital Stay: Admitting: Oncology

## 2025-01-14 ENCOUNTER — Inpatient Hospital Stay

## 2025-01-14 ENCOUNTER — Inpatient Hospital Stay: Admitting: Oncology

## 2025-04-08 ENCOUNTER — Ambulatory Visit: Payer: Self-pay
